# Patient Record
Sex: Female | Born: 1950 | Race: White | Hispanic: No | Marital: Married
Health system: Midwestern US, Community
[De-identification: ages and names within clinical notes are randomized; demographics above are authoritative.]

## PROBLEM LIST (undated history)

## (undated) ENCOUNTER — Emergency Department (HOSPITAL_BASED_OUTPATIENT_CLINIC_OR_DEPARTMENT_OTHER): Payer: Medicare HMO | Source: Home / Self Care

## (undated) DIAGNOSIS — K219 Gastro-esophageal reflux disease without esophagitis: Principal | ICD-10-CM

## (undated) DIAGNOSIS — E559 Vitamin D deficiency, unspecified: Secondary | ICD-10-CM

## (undated) DIAGNOSIS — E785 Hyperlipidemia, unspecified: Principal | ICD-10-CM

## (undated) DIAGNOSIS — E119 Type 2 diabetes mellitus without complications: Principal | ICD-10-CM

## (undated) DIAGNOSIS — M542 Cervicalgia: Secondary | ICD-10-CM

## (undated) DIAGNOSIS — M899 Disorder of bone, unspecified: Principal | ICD-10-CM

## (undated) DIAGNOSIS — K279 Peptic ulcer, site unspecified, unspecified as acute or chronic, without hemorrhage or perforation: Secondary | ICD-10-CM

## (undated) DIAGNOSIS — IMO0001 Reserved for inherently not codable concepts without codable children: Secondary | ICD-10-CM

## (undated) DIAGNOSIS — U071 COVID-19: Secondary | ICD-10-CM

## (undated) DIAGNOSIS — D649 Anemia, unspecified: Secondary | ICD-10-CM

## (undated) DIAGNOSIS — I471 Supraventricular tachycardia, unspecified: Secondary | ICD-10-CM

## (undated) DIAGNOSIS — M5030 Other cervical disc degeneration, unspecified cervical region: Secondary | ICD-10-CM

## (undated) DIAGNOSIS — M1990 Unspecified osteoarthritis, unspecified site: Secondary | ICD-10-CM

## (undated) DIAGNOSIS — K8020 Calculus of gallbladder without cholecystitis without obstruction: Secondary | ICD-10-CM

## (undated) DIAGNOSIS — I7300 Raynaud's syndrome without gangrene: Secondary | ICD-10-CM

## (undated) DIAGNOSIS — R55 Syncope and collapse: Secondary | ICD-10-CM

## (undated) DIAGNOSIS — G709 Myoneural disorder, unspecified: Secondary | ICD-10-CM

## (undated) DIAGNOSIS — Z9889 Other specified postprocedural states: Secondary | ICD-10-CM

## (undated) DIAGNOSIS — M51369 Other intervertebral disc degeneration, lumbar region without mention of lumbar back pain or lower extremity pain: Secondary | ICD-10-CM

## (undated) DIAGNOSIS — K625 Hemorrhage of anus and rectum: Secondary | ICD-10-CM

## (undated) DIAGNOSIS — R112 Nausea with vomiting, unspecified: Secondary | ICD-10-CM

## (undated) DIAGNOSIS — M1120 Other chondrocalcinosis, unspecified site: Secondary | ICD-10-CM

## (undated) DIAGNOSIS — N3010 Interstitial cystitis (chronic) without hematuria: Secondary | ICD-10-CM

## (undated) DIAGNOSIS — Z5189 Encounter for other specified aftercare: Secondary | ICD-10-CM

## (undated) DIAGNOSIS — A0472 Enterocolitis due to Clostridium difficile, not specified as recurrent: Secondary | ICD-10-CM

## (undated) DIAGNOSIS — G4730 Sleep apnea, unspecified: Secondary | ICD-10-CM

## (undated) DIAGNOSIS — M810 Age-related osteoporosis without current pathological fracture: Secondary | ICD-10-CM

## (undated) DIAGNOSIS — R079 Chest pain, unspecified: Secondary | ICD-10-CM

## (undated) DIAGNOSIS — I10 Essential (primary) hypertension: Secondary | ICD-10-CM

## (undated) DIAGNOSIS — G250 Essential tremor: Secondary | ICD-10-CM

## (undated) DIAGNOSIS — G3184 Mild cognitive impairment, so stated: Secondary | ICD-10-CM

## (undated) DIAGNOSIS — M797 Fibromyalgia: Secondary | ICD-10-CM

## (undated) DIAGNOSIS — R0602 Shortness of breath: Secondary | ICD-10-CM

## (undated) DIAGNOSIS — K602 Anal fissure, unspecified: Secondary | ICD-10-CM

## (undated) DIAGNOSIS — E079 Disorder of thyroid, unspecified: Secondary | ICD-10-CM

## (undated) DIAGNOSIS — R413 Other amnesia: Secondary | ICD-10-CM

## (undated) DIAGNOSIS — M5136 Other intervertebral disc degeneration, lumbar region: Secondary | ICD-10-CM

## (undated) DIAGNOSIS — R928 Other abnormal and inconclusive findings on diagnostic imaging of breast: Secondary | ICD-10-CM

## (undated) DIAGNOSIS — C50512 Malignant neoplasm of lower-outer quadrant of left female breast: Secondary | ICD-10-CM

## (undated) DIAGNOSIS — N63 Unspecified lump in unspecified breast: Secondary | ICD-10-CM

## (undated) DIAGNOSIS — R109 Unspecified abdominal pain: Secondary | ICD-10-CM

## (undated) DIAGNOSIS — N644 Mastodynia: Secondary | ICD-10-CM

## (undated) DIAGNOSIS — M719 Bursopathy, unspecified: Secondary | ICD-10-CM

## (undated) DIAGNOSIS — Z1231 Encounter for screening mammogram for malignant neoplasm of breast: Secondary | ICD-10-CM

## (undated) DIAGNOSIS — Z79899 Other long term (current) drug therapy: Secondary | ICD-10-CM

## (undated) DIAGNOSIS — R0609 Other forms of dyspnea: Secondary | ICD-10-CM

## (undated) DIAGNOSIS — R1013 Epigastric pain: Secondary | ICD-10-CM

## (undated) DIAGNOSIS — Z780 Asymptomatic menopausal state: Secondary | ICD-10-CM

## (undated) DIAGNOSIS — M67919 Unspecified disorder of synovium and tendon, unspecified shoulder: Secondary | ICD-10-CM

## (undated) HISTORY — DX: Supraventricular tachycardia: I47.1

## (undated) HISTORY — PX: DILATION AND CURETTAGE OF UTERUS: SHX78

## (undated) HISTORY — DX: Sleep apnea, unspecified: G47.30

## (undated) HISTORY — PX: RECTOCELE REPAIR: SHX761

## (undated) HISTORY — DX: Essential (primary) hypertension: I10

## (undated) HISTORY — DX: Calculus of gallbladder without cholecystitis without obstruction: K80.20

## (undated) HISTORY — DX: Shortness of breath: R06.02

## (undated) HISTORY — PX: CATARACT EXTRACTION: SUR2

## (undated) HISTORY — PX: CARPAL TUNNEL RELEASE: SHX101

## (undated) HISTORY — PX: JOINT REPLACEMENT: SHX530

## (undated) HISTORY — DX: Chest pain, unspecified: R07.9

## (undated) HISTORY — DX: Hemorrhage of anus and rectum: K62.5

## (undated) HISTORY — DX: Other cervical disc degeneration, unspecified cervical region: M50.30

## (undated) HISTORY — PX: OOPHORECTOMY: SHX86

## (undated) HISTORY — DX: Anal fissure, unspecified: K60.2

## (undated) HISTORY — DX: Supraventricular tachycardia, unspecified: I47.10

## (undated) HISTORY — DX: Enterocolitis due to Clostridium difficile, not specified as recurrent: A04.72

## (undated) HISTORY — DX: Peptic ulcer, site unspecified, unspecified as acute or chronic, without hemorrhage or perforation: K27.9

## (undated) HISTORY — DX: Encounter for other specified aftercare: Z51.89

## (undated) HISTORY — DX: Other intervertebral disc degeneration, lumbar region without mention of lumbar back pain or lower extremity pain: M51.369

## (undated) HISTORY — PX: KNEE SURGERY: SHX244

## (undated) HISTORY — DX: Fibromyalgia: M79.7

## (undated) HISTORY — DX: Anemia, unspecified: D64.9

## (undated) HISTORY — PX: CARDIAC CATHETERIZATION: SHX172

## (undated) HISTORY — DX: Mild cognitive impairment, so stated: G31.84

## (undated) HISTORY — DX: Myoneural disorder, unspecified: G70.9

## (undated) HISTORY — PX: BREAST BIOPSY: SHX20

## (undated) HISTORY — DX: Disorder of thyroid, unspecified: E07.9

## (undated) HISTORY — PX: NECK SURGERY: SHX720

## (undated) HISTORY — DX: Unspecified osteoarthritis, unspecified site: M19.90

## (undated) HISTORY — DX: Raynaud's syndrome without gangrene: I73.00

## (undated) HISTORY — PX: CHOLECYSTECTOMY: SHX55

## (undated) HISTORY — DX: Other intervertebral disc degeneration, lumbar region: M51.36

## (undated) HISTORY — PX: TOTAL KNEE ARTHROPLASTY: SHX125

## (undated) HISTORY — PX: TOTAL SHOULDER ARTHROPLASTY: SHX126

## (undated) HISTORY — PX: EYE SURGERY: SHX253

## (undated) HISTORY — DX: Other amnesia: R41.3

## (undated) HISTORY — DX: Other chondrocalcinosis, unspecified site: M11.20

## (undated) HISTORY — PX: ENTEROCELE REPAIR: SHX623

## (undated) HISTORY — DX: Age-related osteoporosis without current pathological fracture: M81.0

## (undated) HISTORY — DX: Essential tremor: G25.0

## (undated) HISTORY — PX: BLADDER SURGERY: SHX569

## (undated) HISTORY — PX: APPENDECTOMY: SHX54

## (undated) HISTORY — DX: Syncope and collapse: R55

## (undated) HISTORY — DX: Interstitial cystitis (chronic) without hematuria: N30.10

## (undated) HISTORY — DX: Reserved for inherently not codable concepts without codable children: IMO0001

## (undated) HISTORY — PX: QUADRICEPS REPAIR: SHX2281

## (undated) HISTORY — DX: Gastro-esophageal reflux disease without esophagitis: K21.9

## (undated) HISTORY — PX: TONSILLECTOMY: SUR1361

## (undated) HISTORY — PX: KNEE ARTHROPLASTY: SHX992

---

## 1998-09-03 ENCOUNTER — Inpatient Hospital Stay (HOSPITAL_COMMUNITY): Admission: EM | Admit: 1998-09-03 | Discharge: 1998-09-04 | Payer: Self-pay | Admitting: Orthopedic Surgery

## 1998-10-04 ENCOUNTER — Encounter: Admission: RE | Admit: 1998-10-04 | Discharge: 1998-11-28 | Payer: Self-pay | Admitting: Orthopedic Surgery

## 1999-01-22 ENCOUNTER — Other Ambulatory Visit: Admission: RE | Admit: 1999-01-22 | Discharge: 1999-01-22 | Payer: Self-pay | Admitting: *Deleted

## 2000-02-03 ENCOUNTER — Encounter: Payer: Self-pay | Admitting: Gastroenterology

## 2000-02-03 ENCOUNTER — Ambulatory Visit (HOSPITAL_COMMUNITY): Admission: RE | Admit: 2000-02-03 | Discharge: 2000-02-03 | Payer: Self-pay | Admitting: Gastroenterology

## 2000-02-12 ENCOUNTER — Encounter: Admission: RE | Admit: 2000-02-12 | Discharge: 2000-02-12 | Payer: Self-pay | Admitting: Gastroenterology

## 2000-02-12 ENCOUNTER — Encounter: Payer: Self-pay | Admitting: Gastroenterology

## 2000-05-27 ENCOUNTER — Other Ambulatory Visit: Admission: RE | Admit: 2000-05-27 | Discharge: 2000-05-27 | Payer: Self-pay | Admitting: *Deleted

## 2001-11-08 ENCOUNTER — Ambulatory Visit (HOSPITAL_COMMUNITY): Admission: RE | Admit: 2001-11-08 | Discharge: 2001-11-08 | Payer: Self-pay | Admitting: Orthopedic Surgery

## 2001-12-14 ENCOUNTER — Other Ambulatory Visit: Admission: RE | Admit: 2001-12-14 | Discharge: 2001-12-14 | Payer: Self-pay | Admitting: *Deleted

## 2002-01-19 ENCOUNTER — Encounter: Admission: RE | Admit: 2002-01-19 | Discharge: 2002-01-19 | Payer: Self-pay | Admitting: Urology

## 2002-01-19 ENCOUNTER — Encounter: Payer: Self-pay | Admitting: Urology

## 2003-06-13 ENCOUNTER — Other Ambulatory Visit: Admission: RE | Admit: 2003-06-13 | Discharge: 2003-06-13 | Payer: Self-pay | Admitting: *Deleted

## 2003-10-15 LAB — HM DEXA SCAN

## 2004-08-11 ENCOUNTER — Other Ambulatory Visit: Admission: RE | Admit: 2004-08-11 | Discharge: 2004-08-11 | Payer: Self-pay | Admitting: *Deleted

## 2005-02-11 ENCOUNTER — Inpatient Hospital Stay (HOSPITAL_COMMUNITY): Admission: RE | Admit: 2005-02-11 | Discharge: 2005-02-13 | Payer: Self-pay | Admitting: *Deleted

## 2005-05-29 ENCOUNTER — Emergency Department (HOSPITAL_COMMUNITY): Admission: EM | Admit: 2005-05-29 | Discharge: 2005-05-29 | Payer: Self-pay | Admitting: Emergency Medicine

## 2005-06-11 ENCOUNTER — Encounter: Admission: RE | Admit: 2005-06-11 | Discharge: 2005-06-11 | Payer: Self-pay | Admitting: Rheumatology

## 2005-09-22 ENCOUNTER — Other Ambulatory Visit: Admission: RE | Admit: 2005-09-22 | Discharge: 2005-09-22 | Payer: Self-pay | Admitting: *Deleted

## 2006-02-01 ENCOUNTER — Encounter: Admission: RE | Admit: 2006-02-01 | Discharge: 2006-02-01 | Payer: Self-pay | Admitting: Orthopedic Surgery

## 2006-05-14 LAB — HM COLONOSCOPY

## 2006-08-26 ENCOUNTER — Encounter: Payer: Self-pay | Admitting: Emergency Medicine

## 2007-02-24 ENCOUNTER — Encounter: Payer: Self-pay | Admitting: Emergency Medicine

## 2007-05-26 ENCOUNTER — Encounter (INDEPENDENT_AMBULATORY_CARE_PROVIDER_SITE_OTHER): Payer: Self-pay | Admitting: Urology

## 2007-05-26 ENCOUNTER — Ambulatory Visit (HOSPITAL_BASED_OUTPATIENT_CLINIC_OR_DEPARTMENT_OTHER): Admission: RE | Admit: 2007-05-26 | Discharge: 2007-05-26 | Payer: Self-pay | Admitting: Urology

## 2007-08-25 ENCOUNTER — Encounter: Payer: Self-pay | Admitting: Emergency Medicine

## 2007-08-29 ENCOUNTER — Ambulatory Visit: Payer: Self-pay | Admitting: Surgery

## 2007-08-29 ENCOUNTER — Other Ambulatory Visit: Admission: RE | Admit: 2007-08-29 | Discharge: 2007-08-29 | Payer: Self-pay | Admitting: *Deleted

## 2007-09-09 ENCOUNTER — Encounter: Payer: Self-pay | Admitting: Emergency Medicine

## 2007-10-31 ENCOUNTER — Encounter: Payer: Self-pay | Admitting: Emergency Medicine

## 2007-12-07 ENCOUNTER — Ambulatory Visit: Payer: Self-pay | Admitting: Physical Medicine & Rehabilitation

## 2007-12-07 ENCOUNTER — Encounter
Admission: RE | Admit: 2007-12-07 | Discharge: 2008-03-06 | Payer: Self-pay | Admitting: Physical Medicine & Rehabilitation

## 2008-01-10 ENCOUNTER — Encounter (INDEPENDENT_AMBULATORY_CARE_PROVIDER_SITE_OTHER): Payer: Self-pay | Admitting: Orthopedic Surgery

## 2008-01-10 ENCOUNTER — Ambulatory Visit: Payer: Self-pay | Admitting: Surgery

## 2008-01-10 ENCOUNTER — Ambulatory Visit (HOSPITAL_COMMUNITY): Admission: RE | Admit: 2008-01-10 | Discharge: 2008-01-10 | Payer: Self-pay | Admitting: Orthopedic Surgery

## 2008-02-07 ENCOUNTER — Ambulatory Visit: Payer: Self-pay | Admitting: Physical Medicine & Rehabilitation

## 2008-04-10 ENCOUNTER — Emergency Department (HOSPITAL_BASED_OUTPATIENT_CLINIC_OR_DEPARTMENT_OTHER): Admission: EM | Admit: 2008-04-10 | Discharge: 2008-04-11 | Payer: Self-pay | Admitting: Emergency Medicine

## 2008-04-13 ENCOUNTER — Encounter: Admission: RE | Admit: 2008-04-13 | Discharge: 2008-07-12 | Payer: Self-pay | Admitting: Orthopedic Surgery

## 2008-04-17 ENCOUNTER — Encounter: Admission: RE | Admit: 2008-04-17 | Discharge: 2008-04-17 | Payer: Self-pay | Admitting: Rheumatology

## 2008-04-25 ENCOUNTER — Emergency Department (HOSPITAL_BASED_OUTPATIENT_CLINIC_OR_DEPARTMENT_OTHER): Admission: EM | Admit: 2008-04-25 | Discharge: 2008-04-26 | Payer: Self-pay | Admitting: Emergency Medicine

## 2008-04-30 ENCOUNTER — Encounter: Payer: Self-pay | Admitting: Emergency Medicine

## 2008-06-08 DIAGNOSIS — I471 Supraventricular tachycardia: Secondary | ICD-10-CM | POA: Insufficient documentation

## 2008-06-08 DIAGNOSIS — N3010 Interstitial cystitis (chronic) without hematuria: Secondary | ICD-10-CM | POA: Insufficient documentation

## 2008-06-08 DIAGNOSIS — I5032 Chronic diastolic (congestive) heart failure: Secondary | ICD-10-CM | POA: Insufficient documentation

## 2008-06-11 ENCOUNTER — Ambulatory Visit: Payer: Self-pay | Admitting: Emergency Medicine

## 2008-06-11 DIAGNOSIS — J309 Allergic rhinitis, unspecified: Secondary | ICD-10-CM | POA: Insufficient documentation

## 2008-06-11 DIAGNOSIS — R0600 Dyspnea, unspecified: Secondary | ICD-10-CM | POA: Insufficient documentation

## 2008-06-11 DIAGNOSIS — M1990 Unspecified osteoarthritis, unspecified site: Secondary | ICD-10-CM | POA: Insufficient documentation

## 2008-07-24 ENCOUNTER — Ambulatory Visit: Payer: Self-pay | Admitting: Emergency Medicine

## 2008-07-24 ENCOUNTER — Encounter (INDEPENDENT_AMBULATORY_CARE_PROVIDER_SITE_OTHER): Payer: Self-pay | Admitting: *Deleted

## 2008-08-10 ENCOUNTER — Encounter: Payer: Self-pay | Admitting: Emergency Medicine

## 2008-08-10 ENCOUNTER — Ambulatory Visit (HOSPITAL_COMMUNITY): Admission: RE | Admit: 2008-08-10 | Discharge: 2008-08-10 | Payer: Self-pay | Admitting: Emergency Medicine

## 2008-08-23 ENCOUNTER — Ambulatory Visit: Payer: Self-pay | Admitting: Internal Medicine

## 2008-08-24 ENCOUNTER — Ambulatory Visit: Payer: Self-pay | Admitting: Emergency Medicine

## 2008-08-26 ENCOUNTER — Ambulatory Visit: Payer: Self-pay | Admitting: Internal Medicine

## 2008-08-30 ENCOUNTER — Encounter: Payer: Self-pay | Admitting: Emergency Medicine

## 2008-09-07 ENCOUNTER — Telehealth: Payer: Self-pay | Admitting: Emergency Medicine

## 2008-09-25 ENCOUNTER — Ambulatory Visit (HOSPITAL_COMMUNITY): Admission: RE | Admit: 2008-09-25 | Discharge: 2008-09-25 | Payer: Self-pay | Admitting: Emergency Medicine

## 2008-09-25 ENCOUNTER — Encounter: Payer: Self-pay | Admitting: Emergency Medicine

## 2008-10-24 ENCOUNTER — Telehealth: Payer: Self-pay | Admitting: Emergency Medicine

## 2008-10-31 ENCOUNTER — Telehealth: Payer: Self-pay | Admitting: Emergency Medicine

## 2008-11-13 ENCOUNTER — Telehealth: Payer: Self-pay | Admitting: Emergency Medicine

## 2009-01-29 ENCOUNTER — Encounter: Admission: RE | Admit: 2009-01-29 | Discharge: 2009-01-29 | Payer: Self-pay | Admitting: Gastroenterology

## 2009-02-27 ENCOUNTER — Ambulatory Visit (HOSPITAL_BASED_OUTPATIENT_CLINIC_OR_DEPARTMENT_OTHER): Admission: RE | Admit: 2009-02-27 | Discharge: 2009-02-27 | Payer: Self-pay | Admitting: Rheumatology

## 2009-03-04 LAB — GLUCOSE TOLERANCE, 2 HR
GLUCOSE, 1 HR: 173 MG/DL — ABNORMAL HIGH (ref 85–160)
GLUCOSE, 2 HR: 89 MG/DL (ref 70–125)
GLUCOSE, FASTING: 108 MG/DL — ABNORMAL HIGH (ref 74–106)

## 2009-03-19 ENCOUNTER — Encounter: Admission: RE | Admit: 2009-03-19 | Discharge: 2009-03-19 | Payer: Self-pay | Admitting: Family Medicine

## 2009-03-29 ENCOUNTER — Ambulatory Visit (HOSPITAL_BASED_OUTPATIENT_CLINIC_OR_DEPARTMENT_OTHER): Admission: RE | Admit: 2009-03-29 | Discharge: 2009-03-29 | Payer: Self-pay | Admitting: Rheumatology

## 2009-04-24 ENCOUNTER — Ambulatory Visit: Payer: Self-pay | Admitting: Emergency Medicine

## 2009-04-24 DIAGNOSIS — G4733 Obstructive sleep apnea (adult) (pediatric): Secondary | ICD-10-CM | POA: Insufficient documentation

## 2009-05-09 ENCOUNTER — Telehealth: Payer: Self-pay | Admitting: Emergency Medicine

## 2009-05-14 LAB — ANNUAL GYN EXAM; BREAST EXAM W/O PELVIC EVAL

## 2009-05-14 LAB — HM PAP SMEAR

## 2009-05-22 ENCOUNTER — Encounter: Payer: Self-pay | Admitting: Emergency Medicine

## 2009-06-17 ENCOUNTER — Encounter: Admission: RE | Admit: 2009-06-17 | Discharge: 2009-06-17 | Payer: Self-pay | Admitting: Neurosurgery

## 2009-06-21 LAB — VITAMIN D, 25 HYDROXY: Vitamin D 25-Hydroxy: 23 ng/mL — ABNORMAL LOW (ref 30–80)

## 2009-06-21 LAB — ALDOLASE: Aldolase: 5.4 U/L (ref 1.5–8.1)

## 2009-06-22 LAB — CK: CK: 57 U/L (ref 21–215)

## 2009-07-30 ENCOUNTER — Ambulatory Visit: Payer: Self-pay | Admitting: Emergency Medicine

## 2009-09-15 ENCOUNTER — Emergency Department (HOSPITAL_BASED_OUTPATIENT_CLINIC_OR_DEPARTMENT_OTHER): Admission: EM | Admit: 2009-09-15 | Discharge: 2009-09-16 | Payer: Self-pay | Admitting: Emergency Medicine

## 2009-09-15 ENCOUNTER — Ambulatory Visit: Payer: Self-pay | Admitting: Diagnostic Radiology

## 2009-09-16 ENCOUNTER — Ambulatory Visit: Payer: Self-pay | Admitting: Diagnostic Radiology

## 2009-12-27 ENCOUNTER — Telehealth (INDEPENDENT_AMBULATORY_CARE_PROVIDER_SITE_OTHER): Payer: Self-pay | Admitting: *Deleted

## 2010-01-10 ENCOUNTER — Ambulatory Visit: Payer: Self-pay | Admitting: Emergency Medicine

## 2010-06-17 LAB — MICROSCOPIC EXAMINATION

## 2010-06-17 LAB — URINALYSIS W/ RFLX MICROSCOPIC
Bilirubin: NEGATIVE
Glucose: NEGATIVE
Ketone: NEGATIVE
Leukocyte Esterase: NEGATIVE
Nitrites: NEGATIVE
Protein: NEGATIVE
Specific Gravity: 1.026 (ref 1.005–1.030)
Urobilinogen: 0.2 mg/dL (ref 0.0–1.9)
pH (UA): 6 (ref 5.0–7.5)

## 2010-06-17 LAB — CBC WITH AUTOMATED DIFF
ABS. BASOPHILS: 0 10*3/uL (ref 0.0–0.2)
ABS. EOSINOPHILS: 0.1 10*3/uL (ref 0.0–0.4)
ABS. IMM. GRANS.: 0 10*3/uL (ref 0.0–0.1)
ABS. LYMPHOCYTES: 1.2 10*3/uL (ref 0.7–4.5)
ABS. MONOCYTES: 0.4 10*3/uL (ref 0.1–1.0)
ABS. NEUTROPHILS: 4 10*3/uL (ref 1.8–7.8)
BASOPHILS: 0 % (ref 0–3)
EOSINOPHILS: 2 % (ref 0–7)
HCT: 38.4 % (ref 34.0–44.0)
HGB: 12.3 g/dL (ref 11.5–15.0)
IMMATURE GRANULOCYTES: 0 % (ref 0–2)
LYMPHOCYTES: 21 % (ref 14–46)
MCH: 29.9 pg (ref 27.0–34.0)
MCHC: 32 g/dL (ref 32.0–36.0)
MCV: 93 fL (ref 80–98)
MONOCYTES: 7 % (ref 4–13)
NEUTROPHILS: 70 % (ref 40–74)
PLATELET: 235 10*3/uL (ref 140–415)
RBC: 4.11 x10E6/uL (ref 3.80–5.10)
RDW: 13.2 % (ref 11.7–15.0)
WBC: 5.7 10*3/uL (ref 4.0–10.5)

## 2010-06-17 LAB — LIPID PANEL
Cholesterol, total: 160 mg/dL (ref 100–199)
HDL Cholesterol: 39 mg/dL — ABNORMAL LOW (ref >39)
LDL, calculated: 87 mg/dL (ref 0–99)
Triglyceride: 172 mg/dL — ABNORMAL HIGH (ref 0–149)
VLDL, calculated: 34 mg/dL (ref 5–40)

## 2010-06-17 LAB — HEMOGLOBIN A1C WITH EAG: Hemoglobin A1c: 6.3 % — ABNORMAL HIGH (ref 4.8–5.6)

## 2010-06-17 LAB — TSH 3RD GENERATION: TSH: 2.28 u[IU]/mL (ref 0.450–4.500)

## 2010-06-17 LAB — GLUCOSE, RANDOM: Glucose: 108 mg/dL — ABNORMAL HIGH (ref 65–99)

## 2010-06-27 ENCOUNTER — Ambulatory Visit: Payer: Self-pay | Admitting: Cardiovascular Disease

## 2010-08-22 ENCOUNTER — Ambulatory Visit: Payer: Self-pay | Admitting: Cardiovascular Disease

## 2010-08-25 ENCOUNTER — Encounter: Admission: RE | Admit: 2010-08-25 | Discharge: 2010-08-25 | Payer: Self-pay | Admitting: Cardiovascular Disease

## 2010-08-25 ENCOUNTER — Telehealth (INDEPENDENT_AMBULATORY_CARE_PROVIDER_SITE_OTHER): Payer: Self-pay | Admitting: *Deleted

## 2010-08-25 ENCOUNTER — Ambulatory Visit: Payer: Self-pay | Admitting: Emergency Medicine

## 2010-08-25 DIAGNOSIS — R930 Abnormal findings on diagnostic imaging of skull and head, not elsewhere classified: Secondary | ICD-10-CM | POA: Insufficient documentation

## 2010-08-25 DIAGNOSIS — R609 Edema, unspecified: Secondary | ICD-10-CM | POA: Insufficient documentation

## 2010-08-27 ENCOUNTER — Ambulatory Visit: Payer: Self-pay | Admitting: Internal Medicine

## 2010-08-27 ENCOUNTER — Encounter: Payer: Self-pay | Admitting: Emergency Medicine

## 2010-08-27 ENCOUNTER — Ambulatory Visit (HOSPITAL_COMMUNITY): Admission: RE | Admit: 2010-08-27 | Discharge: 2010-08-27 | Payer: Self-pay | Admitting: Cardiology

## 2010-08-27 ENCOUNTER — Ambulatory Visit: Payer: Self-pay

## 2010-08-28 ENCOUNTER — Encounter: Payer: Self-pay | Admitting: Emergency Medicine

## 2010-08-29 LAB — CONVERTED CEMR LAB

## 2010-08-30 ENCOUNTER — Emergency Department (HOSPITAL_BASED_OUTPATIENT_CLINIC_OR_DEPARTMENT_OTHER): Admission: EM | Admit: 2010-08-30 | Discharge: 2010-08-31 | Payer: Self-pay | Admitting: Emergency Medicine

## 2010-08-30 ENCOUNTER — Ambulatory Visit: Payer: Self-pay | Admitting: Diagnostic Radiology

## 2010-09-01 ENCOUNTER — Telehealth (INDEPENDENT_AMBULATORY_CARE_PROVIDER_SITE_OTHER): Payer: Self-pay | Admitting: *Deleted

## 2010-09-04 ENCOUNTER — Telehealth (INDEPENDENT_AMBULATORY_CARE_PROVIDER_SITE_OTHER): Payer: Self-pay | Admitting: *Deleted

## 2010-09-04 ENCOUNTER — Ambulatory Visit: Payer: Self-pay | Admitting: Emergency Medicine

## 2010-09-30 ENCOUNTER — Ambulatory Visit: Payer: Self-pay | Admitting: Vascular Surgery

## 2010-10-30 MED ORDER — CITALOPRAM 10 MG TAB
10 mg | ORAL_TABLET | Freq: Every day | ORAL | Status: DC
Start: 2010-10-30 — End: 2012-03-22

## 2010-10-30 MED ORDER — ATORVASTATIN 10 MG TAB
10 mg | ORAL_TABLET | Freq: Every day | ORAL | Status: DC
Start: 2010-10-30 — End: 2011-03-13

## 2010-10-30 NOTE — Telephone Encounter (Signed)
Pt notified RX ready for pick up

## 2010-10-30 NOTE — Telephone Encounter (Signed)
Last seen 06-16-10

## 2010-11-17 ENCOUNTER — Encounter: Payer: Self-pay | Admitting: Neurosurgery

## 2010-11-23 LAB — CONVERTED CEMR LAB
BUN: 9 mg/dL (ref 6–23)
CO2: 29 meq/L (ref 19–32)
Calcium: 8.8 mg/dL (ref 8.4–10.5)
Chloride: 97 meq/L (ref 96–112)
Creatinine, Ser: 0.9 mg/dL (ref 0.4–1.2)
GFR calc non Af Amer: 68.12 mL/min (ref >60)
Glucose, Bld: 95 mg/dL (ref 70–99)
Potassium: 3.5 meq/L (ref 3.5–5.1)
Sodium: 139 meq/L (ref 135–145)

## 2010-11-25 NOTE — Consult Note (Signed)
Summary: palpitations/[facility]  palpitations/[facility]   Imported By: Lester [place] 06/19/2008 10:44:20  _____________________________________________________________________  External Attachment:    Type:   Image     Comment:   External Document

## 2010-11-25 NOTE — Consult Note (Signed)
Summary: extremity edema/[facility]  extremity edema/[facility]   Imported By: Lester [place] 06/19/2008 10:45:47  _____________________________________________________________________  External Attachment:    Type:   Image     Comment:   External Document

## 2010-11-25 NOTE — Miscellaneous (Signed)
Summary: Orders Update  Clinical Lists Changes  Orders: Added new Service order of Est. Patient Level III (99213) - Signed 

## 2010-11-25 NOTE — Progress Notes (Signed)
Summary: cpap mask /settings   [email or web address]  Phone Note Call from Patient Call back at [facility] Phone [phone]   Caller: Patient Call For: Aprel Egelhoff Summary of Call: pt says that she can not tolerate the new cpap mask. says that the person from [facility] told her to call and get an order for auto machine to check settings. call home or cell [phone] Initial call taken by: Tivis Ringer,  May 09, 2009 2:06 PM  Follow-up for Phone Call        Dr. Mort Sawyers this ok to give an order for auto machine to check the settings?  please advise triage Marijo File CMA  May 09, 2009 2:29 PM   Yes, they can do auto-set. They might also need to try some other masks for better fit. thanks  [email or web address].  Arman Filter LPN  May 10, 2009 5:26 PM  Follow-up by: Leslye Peer MD,  May 10, 2009 5:13 PM  Additional Follow-up for Phone Call Additional follow up Details #1::        called and spoke with pt. pt aware of RB's response.  Will send order to [facility] for auto download.  Aundra Millet Reynolds LPN  May 13, 2009 11:33 AM

## 2010-11-25 NOTE — Assessment & Plan Note (Signed)
Summary: OSA, edema, recent abnormal CT scan   Visit Type:  Follow-up Copy to:  Delane Ginger Primary Provider/Referring Provider:  Veronia Beets  CC:  The patient c/o increased SOB with exertion and lower leg edema x2 1/2 weeks...patient needs new cpap mask.  History of Present Illness: 60 yo woman never smoker with hx paroxismal SVT, Holter showed correlation at the time with exertional dyspnea and lightheadedness. PFT, methacholine and CPEX were reassuring. Has undergone CPAP titration and needs 14cm H2O pressure  ROV 07/30/09 -- Not using nasal pillows, using the nasal mask. Has been switched to auto-titration device due to difficulty tolerating 14cm H2O. Has had better compliance with latest mask. Wears it every night, When she wakes up in the middle of the night she takes it off. Can't really tell any difference in how she feels during the day - does have less fibromyalgia pain since she's been using it.   January 10, 2010--Presents for acute office visit. Complains of occ pain in left upper back wrapping around to the left breast when she takes a deep breath x8weeks.   Happens intermittently, sore to touch, pain is under shoulder blade w/ radiation around to rbs. Use muscle relaxer patch did not help, no other meds used. Denies chest pain, dyspnea, orthopnea, hemoptysis, fever, n/v/d, edema, headache,recent travel or antibiotics.    ROV 08/25/10 -- Hx OSA and HTN. Was started on diltiazem about 6 weeks ago. Began to develop B LE edema, L>R, about 2 weeks ago. US showed no DVT, CT scan chest done, no PE showed "? PNA" so treated with levaquin. She had absolutely no symptoms at that time. She has been having some exertional dyspnea, occasionally feels her tachycardia, not sure the two correlate. Tells me she hasn't worn her CPAP in about 4 months because her mask is hurting her face.   Current Medications (verified): 1)  Estrace 2 Mg  Tabs (Estradiol) .Marland Kitchen.. 1 By Mouth Daily 2)  Digoxin 0.25 Mg   Tabs (Digoxin) .Marland Kitchen.. 1 By Mouth Daily 3)  Lyrica 150 Mg  Caps (Pregabalin) .... Take 1 Tablet By Mouth Two Times A Day 4)  Folgard .Marland KitchenMarland Kitchen. 1 Daily 5)  Zegerid 40-1100 Mg  Caps (Omeprazole-Sodium Bicarbonate) .Marland Kitchen.. 1 By Mouth Daily 6)  Magnesium 250 Mg  Tabs (Magnesium) .Marland Kitchen.. 1 By Mouth Two Times A Day 7)  Eql Calcium 600 Mg  Tabs (Calcium Carbonate) .Marland Kitchen.. 1 By Mouth Two Times A Day 8)  Potassium Gluconate 595 Mg  Tabs (Potassium Gluconate) .Marland Kitchen.. 1 By Mouth Daily 9)  Allegra 180 Mg  Tabs (Fexofenadine Hcl) .... Take 1 Tablet By Mouth Once A Day 10)  Nitrostat 0.4 Mg Subl (Nitroglycerin) .Marland Kitchen.. 1 Under Tongue Every 5 Minutes X3 As Needed Chest Pain 11)  Furosemide 20 Mg  Tabs (Furosemide) .Marland Kitchen.. 1 By Mouth Daily 12)  Elmiron 100 Mg  Caps (Pentosan Polysulfate Sodium) .... Three Times A Day 13)  Trimethoprim 100 Mg  Tabs (Trimethoprim) .... Take 1 Tablet By Mouth Once A Day 14)  Pindolol 5 Mg Tabs (Pindolol) .... Take 1 Tablet By Mouth Once A Day 15)  Flonase 50 Mcg/act Susp (Fluticasone Propionate) .... 2 Sprays Once A Day As Needed 16)  Ultra C Tablets .Marland Kitchen.. 6 Tablets As Needed 17)  Flexeril 10 Mg Tabs (Cyclobenzaprine Hcl) .... Take 1 Tab By Mouth At Bedtime 18)  Flexcort Patches .... As Needed 19)  Lidoderm 5 % Ptch (Lidocaine) .... Apply As Needed 20)  Ibuprofen 800 Mg Tabs (Ibuprofen) .Marland KitchenMarland KitchenMarland Kitchen  Use As Directed 21)  Cpap .... Wear At Bedtime 22)  Diltiazem Hcl Cr 180 Mg Xr24h-Cap (Diltiazem Hcl) .Marland Kitchen.. 1 By Mouth Daily  Allergies (verified): 1)  ! Verapamil 2)  ! * Diltiazem 3)  ! * Toprol 4)  ! Codeine 5)  ! Macrodantin  Vital Signs:  Patient profile:   60 year old female Height:      66 inches (167.64 cm) Weight:      173.25 pounds (78.75 kg) BMI:     28.06 O2 Sat:      97 % on Room air Temp:     97.8 degrees F (36.56 degrees C) oral Pulse rate:   77 / minute BP sitting:   120 / 80  (left arm) Cuff size:   regular  Vitals Entered By: Michel Bickers CMA (August 25, 2010 4:36 PM)  O2 Sat at  Rest %:  97 O2 Flow:  Room air CC: The patient c/o increased SOB with exertion and lower leg edema x2 1/2 weeks...patient needs new cpap mask Comments Medications reviewed with patient Michel Bickers [facility]  August 25, 2010 4:50 PM   Physical Exam  General:  normal appearance and healthy appearing.   Head:  normocephalic and atraumatic Nose:  no deformity, discharge, inflammation, or lesions Mouth:  no deformity or lesions Neck:  no masses, thyromegaly, or abnormal cervical nodes Lungs:  clear bilaterally to auscultation and percussion Heart:  regular rate and rhythm, S1, S2, ? soft S4 Abdomen:  not examined Msk:  no deformity or scoliosis noted with normal posture Extremities:  2+ L LE and 1+ RLE edema.  Neurologic:  non-focal Skin:  intact without lesions or rashes Psych:  alert and cooperative; normal mood and affect; normal attention span and concentration  EXECUTABLE Impression & Recommendations:  Problem # 1:  DYSPNEA ON EXERTION (ICD-786.09) Her pulm eval has been reassuring. She isn't wearing CPAP, ? worsening Pulm presures leading to edema - seems very acute for this to be the explanation. ? whether she is having worsening episodic SVT to explain both her dyspnea and her edema. Also consider edema is due to side effect from the diltiazem, her only new med.   Problem # 2:  SLEEP APNEA (ICD-780.57)  Currently untreated! This could be contributor to her edema as well.   Orders: DME Referral (DME)  Problem # 3:  EDEMA (ICD-782.3)  - need TTE by Dr Elease Hashimoto - consider repeat Holter moniter - BMP today  - 24 hour urine to look for proteinuria, ? nephrotic syndrome  Orders: Est. Patient Level IV (99214) T-Urine 24 Hr. Protein [phone]) TLB-BMP (Basic Metabolic Panel-BMET) (80048-METABOL) DME Referral (DME) Echo Referral (Echo)  Problem # 4:  COMPUTERIZED TOMOGRAPHY, CHEST, ABNORMAL (ICD-793.1) - need to get a copy of the CT scan done [facility] so we can  eval and plan next step.   Medications Added to Medication List This Visit: 1)  Diltiazem Hcl Cr 180 Mg Xr24h-cap (Diltiazem hcl) .Marland Kitchen.. 1 by mouth daily  Patient Instructions: 1)  We will perform a 24h urinalysis and blood work to check your kidney function.  2)  We will obtain a copy of your CT scan of the chest from [facility] 3)  We will ask Dr Elease Hashimoto to perform an echocardiogram, possibly a repeat Holter monitor. 4)  We will work on getting your CPAP mask adjusted so you can wear more reliably 5)  Follow up with Dr Delton Coombes in 1 week to review these results  Immunization History:  Influenza Immunization History:    Influenza:  historical (08/19/2010)

## 2010-11-25 NOTE — Consult Note (Signed)
Summary: pt "winded"/[facility]  pt "winded"/[facility]   Imported By: Lester [place] 06/19/2008 10:48:25  _____________________________________________________________________  External Attachment:    Type:   Image     Comment:   External Document

## 2010-11-25 NOTE — Progress Notes (Signed)
Summary: Update on swelling  Phone Note Call from Patient Call back at [phone]   Caller: Patient Call For: byrum Summary of Call: pt went to er for feet swelling and sob. would like to give update. Initial call taken by: Rickard Patience,  September 01, 2010 9:57 AM  Follow-up for Phone Call        The patient was calling to let RB know she had been seen in the ED off [address] on Sat., 08/30/2010. They stopped her potassium and started her on Spironalactone 50mg  daily. She is also still taking Lasix 20mg  daily. They told her in the Ed that she should be tested for [facility] with heart cath. Her swelling has gotten better but still present. She is scheduled for ROV with RB on Thurs., 09/04/2010 and will keep this appt. Follow-up by: Michel Bickers CMA,  September 01, 2010 11:28 AM  Additional Follow-up for Phone Call Additional follow up Details #1::        Her that her echo shows no evidence to support Pulm HTN. Her 24 h urine shows no evidence for nephrotic syndrome. Will plan to f/u w her on 11/10 to review further. Leslye Peer MD  September 02, 2010 4:21 PM  Additional Follow-up by: Leslye Peer MD,  September 02, 2010 4:21 PM     Appended Document: Update on swelling Patient is aware of RB comments on Echo and 24 hour urine. She will follow-up as planned with RB on 09/04/2010 @ 1:30pm.

## 2010-11-25 NOTE — Progress Notes (Signed)
Summary: pain/ breathing  Phone Note Call from Patient Call back at [facility] Phone [phone]   Caller: Patient Call For: byrum Summary of Call: pt c/o pain under L shoulder blade when taking a deep breath x 1 month. i have scheduled pt for 1st avail w/ tp (1st avail w/ rb is in april). I advised pt to call if she feels she needs to be seen sooner by another dr, etc. she said she would call. i just thought i would send this msg in case nurse wants to call pt to assess further if needed. i didn't know how long she should wait to be seen since she has had this for a month now. thanks.  Initial call taken by: Tivis Ringer, CNA,  December 27, 2009 3:15 PM  Follow-up for Phone Call        ADVISED PT TO CALL HER PRIMARY MD TO SEE IF THEY COULD SEE HER FOR THIS SINCE RB TREATS HER FOR SLEEP APNEA--PT OK WITH CALLING PRIMARY FOR THIS, ALSO SCHEDULED 6 MONTH F/U FOR PT TO SEE RB Follow-up by: Philipp Deputy CMA,  December 27, 2009 5:19 PM

## 2010-11-25 NOTE — Assessment & Plan Note (Signed)
Summary: edema, OSA   Visit Type:  Follow-up Copy to:  Delane Ginger Primary Provider/Referring Provider:  Veronia Beets  CC:  patient says edema has improved but still present...skin burns to the touch on lower legs.  History of Present Illness: 60 yo woman never smoker with hx paroxismal SVT, Holter showed correlation at the time with exertional dyspnea and lightheadedness. PFT, methacholine and CPEX were reassuring. Has undergone CPAP titration and needs 14cm H2O pressure  ROV 07/30/09 -- Not using nasal pillows, using the nasal mask. Has been switched to auto-titration device due to difficulty tolerating 14cm H2O. Has had better compliance with latest mask. Wears it every night, When she wakes up in the middle of the night she takes it off. Can't really tell any difference in how she feels during the day - does have less fibromyalgia pain since she's been using it.   January 10, 2010--Presents for acute office visit. Complains of occ pain in left upper back wrapping around to the left breast when she takes a deep breath x8weeks.   Happens intermittently, sore to touch, pain is under shoulder blade w/ radiation around to rbs. Use muscle relaxer patch did not help, no other meds used. Denies chest pain, dyspnea, orthopnea, hemoptysis, fever, n/v/d, edema, headache,recent travel or antibiotics.    ROV 08/25/10 -- Hx OSA and HTN. Was started on diltiazem about 6 weeks ago. Began to develop B LE edema, L>R, about 2 weeks ago. US showed no DVT, CT scan chest done, no PE showed "? PNA" so treated with levaquin. She had absolutely no symptoms at that time. She has been having some exertional dyspnea, occasionally feels her tachycardia, not sure the two correlate. Tells me she hasn't worn her CPAP in about 4 months because her mask is hurting her face.   ROV 09/04/10 -- Returns to f/u her edema, exertional SOB. I performed 24h urine, TTE last time - both are reassuring. She has not been wearing her CPAP  mask reliably, needs a new mask.   Current Medications (verified): 1)  Estrace 2 Mg  Tabs (Estradiol) .Marland Kitchen.. 1 By Mouth Daily 2)  Digoxin 0.25 Mg  Tabs (Digoxin) .Marland Kitchen.. 1 By Mouth Daily 3)  Lyrica 150 Mg  Caps (Pregabalin) .... Take 1 Tablet By Mouth Two Times A Day 4)  Folgard .Marland KitchenMarland Kitchen. 1 Daily 5)  Zegerid 40-1100 Mg  Caps (Omeprazole-Sodium Bicarbonate) .Marland Kitchen.. 1 By Mouth Daily 6)  Potassium Gluconate 595 Mg  Tabs (Potassium Gluconate) .Marland Kitchen.. 1 By Mouth Daily 7)  Allegra 180 Mg  Tabs (Fexofenadine Hcl) .... Take 1 Tablet By Mouth Once A Day 8)  Nitrostat 0.4 Mg Subl (Nitroglycerin) .Marland Kitchen.. 1 Under Tongue Every 5 Minutes X3 As Needed Chest Pain 9)  Furosemide 20 Mg  Tabs (Furosemide) .... 40mg  Daily 10)  Elmiron 100 Mg  Caps (Pentosan Polysulfate Sodium) .... Three Times A Day 11)  Trimethoprim 100 Mg  Tabs (Trimethoprim) .... Take 1 Tablet By Mouth Once A Day 12)  Pindolol 5 Mg Tabs (Pindolol) .... Take 1 Tablet By Mouth Once A Day 13)  Flonase 50 Mcg/act Susp (Fluticasone Propionate) .... 2 Sprays Once A Day As Needed 14)  Ultra C Tablets .Marland Kitchen.. 6 Tablets As Needed 15)  Flexeril 10 Mg Tabs (Cyclobenzaprine Hcl) .... Take 1 Tab By Mouth At Bedtime 16)  Flexcort Patches .... As Needed 17)  Lidoderm 5 % Ptch (Lidocaine) .... Apply As Needed 18)  Ibuprofen 800 Mg Tabs (Ibuprofen) .... Use As Directed 19)  Cpap .Marland KitchenMarland KitchenMarland Kitchen  Wear At Bedtime 20)  Diltiazem Hcl Cr 180 Mg Xr24h-Cap (Diltiazem Hcl) .Marland Kitchen.. 1 By Mouth Daily 21)  Spironolactone 50 Mg Tabs (Spironolactone) .Marland Kitchen.. 1 By Mouth Daily  Allergies (verified): 1)  ! Verapamil 2)  ! * Diltiazem 3)  ! * Toprol 4)  ! Codeine 5)  ! Macrodantin  Vital Signs:  Patient profile:   60 year old female Height:      66 inches (167.64 cm) Weight:      167 pounds (75.91 kg) BMI:     27.05 O2 Sat:      98 % on Room air Temp:     97.8 degrees F (36.56 degrees C) oral Pulse rate:   66 / minute BP sitting:   130 / 78  (left arm) Cuff size:   regular  Vitals Entered By:  Michel Bickers CMA (September 04, 2010 1:41 PM)  O2 Sat at Rest %:  98 O2 Flow:  Room air CC: patient says edema has improved but still present...skin burns to the touch on lower legs Comments Medications reviewed with patient Michel Bickers CMA  September 04, 2010 1:42 PM   Physical Exam  General:  normal appearance and healthy appearing.   Head:  normocephalic and atraumatic Nose:  no deformity, discharge, inflammation, or lesions Mouth:  no deformity or lesions Neck:  no masses, thyromegaly, or abnormal cervical nodes Lungs:  clear bilaterally to auscultation and percussion Heart:  regular rate and rhythm, S1, S2, ? soft S4 Abdomen:  not examined Msk:  no deformity or scoliosis noted with normal posture Extremities:  1+ L LE and 1+ RLE edema.  Neurologic:  non-focal Skin:  intact without lesions or rashes Psych:  alert and cooperative; normal mood and affect; normal attention span and concentration   Echocardiogram  Procedure date:  08/27/2010  Findings:      Study Conclusions            - Left ventricle: The cavity size was normal. Wall thickness was       normal. Systolic function was normal. The estimated ejection       fraction was in the range of 55% to 60%. Wall motion was normal;       there were no regional wall motion abnormalities. Left ventricular       diastolic function parameters were normal.     - Left atrium: The atrium was mildly dilated.  CT of Chest  Procedure date:  08/12/2010  Findings:      Reviewed today - No PE. Some dependent ATX bilat, best seen LLL.   Impression & Recommendations:  Problem # 1:  DYSPNEA ON EXERTION (ICD-786.09) Believe this reflects deconditioning, workup has been reassuring  Her updated medication list for this problem includes:    Furosemide 20 Mg Tabs (Furosemide) ..... 40mg  daily    Pindolol 5 Mg Tabs (Pindolol) .Marland Kitchen... Take 1 tablet by mouth once a day    Spironolactone 50 Mg Tabs (Spironolactone) .Marland Kitchen... 1 by mouth  daily  Problem # 2:  EDEMA (ICD-782.3) Not explained. No evidence DVT, good LV fxn, no evidence ne[phrotic syndrome. ? lymphedema  Problem # 3:  SLEEP APNEA (ICD-780.57)  Need to get a better fit mask, work on compliance  Orders: DME Referral (DME) Est. Patient Level IV (95621)  Medications Added to Medication List This Visit: 1)  Furosemide 20 Mg Tabs (Furosemide) .... 40mg  daily 2)  Spironolactone 50 Mg Tabs (Spironolactone) .Marland Kitchen.. 1 by mouth daily  Patient Instructions: 1)  We will ask [facility] to get you a better fitting CPAP mask.  2)  Work on wearing your mask every night 3)  Follow up with Dr Delton Coombes in 6 months or as needed

## 2010-11-25 NOTE — Assessment & Plan Note (Signed)
Summary: PAIN W/ DEEP BREATH x 1 month///kp   Copy to:  Lady Deutscher  CC:  occ pain in left upper back wrapping around to the left breast when she takes a deep breath x8weeks.Marland Kitchen  History of Present Illness: 60 yo woman never smoker with hx paroxismal SVT, Holter showed correlation at the time with exertional dyspnea and lightheadedness. PFT, methacholine and CPEX were reassuring. Has undergone CPAP titration and needs 14cm H2O pressure  ROV 07/30/09 -- Not using nasal pillows, using the nasal mask. Has been switched to auto-titration device due to difficulty tolerating 14cm H2O. Has had better compliance with latest mask. Wears it every night, When she wakes up in the middle of the night she takes it off. Can't really tell any difference in how she feels during the day - does have less fibromyalgia pain since she's been using it.   January 10, 2010--Presents for acute office visit. Complains of occ pain in left upper back wrapping around to the left breast when she takes a deep breath x8weeks.   Happens intermittently, sore to touch, pain is under shoulder blade w/ radiation around to rbs. Use muscle relaxer patch did not help, no other meds used. Denies chest pain, dyspnea, orthopnea, hemoptysis, fever, n/v/d, edema, headache,recent travel or antibiotics.    Medications Prior to Update: 1)  Estrace 2 Mg  Tabs (Estradiol) .Marland Kitchen.. 1 By Mouth Daily 2)  Colchicine 0.6 Mg  Tabs (Colchicine) .Marland Kitchen.. 1 By Mouth Daily 3)  Digoxin 0.25 Mg  Tabs (Digoxin) .Marland Kitchen.. 1 By Mouth Daily 4)  Lyrica 150 Mg  Caps (Pregabalin) .... Take 1 Tablet By Mouth Two Times A Day 5)  Folgard .Marland KitchenMarland Kitchen. 1 Daily 6)  Zegerid 40-1100 Mg  Caps (Omeprazole-Sodium Bicarbonate) .Marland Kitchen.. 1 By Mouth Daily 7)  Adult Aspirin Ec Low Strength 81 Mg  Tbec (Aspirin) .Marland Kitchen.. 1 By Mouth Daily 8)  Magnesium 250 Mg  Tabs (Magnesium) .Marland Kitchen.. 1 By Mouth Two Times A Day 9)  Eql Calcium 600 Mg  Tabs (Calcium Carbonate) .Marland Kitchen.. 1 By Mouth Two Times A Day 10)  Potassium  Gluconate 595 Mg  Tabs (Potassium Gluconate) .Marland Kitchen.. 1 By Mouth Daily 11)  Allegra 180 Mg  Tabs (Fexofenadine Hcl) .... Take 1 Tablet By Mouth Once A Day As Needed 12)  Nitrostat 0.4 Mg  Subl (Nitroglycerin) .... As Needed 13)  Furosemide 20 Mg  Tabs (Furosemide) .Marland Kitchen.. 1 By Mouth Daily 14)  Elmiron 100 Mg  Caps (Pentosan Polysulfate Sodium) .... Three Times A Day 15)  Trimethoprim 100 Mg  Tabs (Trimethoprim) .... Take 1 Tablet By Mouth Once A Day 16)  Pindolol 5 Mg Tabs (Pindolol) .... Take 1 Tablet By Mouth Once A Day 17)  Flonase 50 Mcg/act Susp (Fluticasone Propionate) .... 2 Sprays Once A Day 18)  Ultra C Tablets .Marland Kitchen.. 6 Tablets As Needed 19)  Flexeril 10 Mg Tabs (Cyclobenzaprine Hcl) .... Take 1 Tablet By Mouth Once A Day 20)  Meprozine 50/25 .... As Needed 21)  Flexcort Patches .... As Needed 22)  Fish Oil Maximum Strength 1200 Mg Caps (Omega-3 Fatty Acids) .... Take 1 Tablet By Mouth Two Times A Day  Allergies (verified): 1)  ! Verapamil 2)  ! * Diltiazem 3)  ! * Toprol 4)  ! Codeine 5)  ! 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  Pt is married with children. Pt is a Recruitment consultant  Risk Factors: Smoking Status: never (06/11/2008)  Review of Systems      See HPI  Vital  Signs:  Patient profile:   60 year old female Height:      66 inches Weight:      159.38 pounds BMI:     25.82  Vitals Entered By: Boone Master CNA (January 10, 2010 3:46 PM) CC: occ pain in left upper back wrapping around to the left breast when she takes a deep breath x8weeks. Is Patient Diabetic? No Comments Medications reviewed with patient Daytime contact number verified with patient. Boone Master CNA  January 10, 2010 3:46 PM    Physical Exam  Additional Exam:  GEN: A/Ox3; pleasant , NAD HEENT:  [place]/AT, , EACs-clear, TMs-wnl, NOSE-clear, THROAT-clear NECK:  Supple w/ fair ROM; no JVD; normal carotid impulses w/o bruits; no thyromegaly or nodules palpated; no lymphadenopathy. RESP  Clear to P & A; w/o, wheezes/ rales/ or rhonchi. CARD:  RRR, no m/r/g   GI:   Soft & nt; nml bowel sounds; no organomegaly or masses detected. Musco: Warm bil,  no calf tenderness edema, clubbing, pulses intact, tender along left post scapular area, no redness or rash noted. Nml ROM of shoulder . nml grips .  Neuro: EOM-wnl, PERRLA, CN 2-12 intact,nml equal grips/streng     Impression & Recommendations:  Problem # 1:  OSTEOARTHRITIS (ICD-715.90)  Mild flare w/ muscle spasm in upper back.  REC:  Ibuprofen 800mg  two times a day for 5 days w/ food.  Alternate ice and heat to area as needed  Increase flexeril 10mg  1/2 in am  and 1 at bedtime for 5 days Please contact office for sooner follow up if symptoms do not improve or worsen  follow up Dr. Delton Coombes  as scheduled.   Her updated medication list for this problem includes:    Adult Aspirin Ec Low Strength 81 Mg Tbec (Aspirin) .Marland Kitchen... 1 by mouth daily  Orders: Est. Patient Level III (16109)  Complete Medication List: 1)  Estrace 2 Mg Tabs (Estradiol) .Marland Kitchen.. 1 by mouth daily 2)  Colchicine 0.6 Mg Tabs (Colchicine) .Marland Kitchen.. 1 by mouth daily 3)  Digoxin 0.25 Mg Tabs (Digoxin) .Marland Kitchen.. 1 by mouth daily 4)  Lyrica 150 Mg Caps (Pregabalin) .... Take 1 tablet by  mouth two times a day 5)  Folgard  .Marland Kitchen.. 1 daily 6)  Zegerid 40-1100 Mg Caps (Omeprazole-sodium bicarbonate) .Marland Kitchen.. 1 by mouth daily 7)  Adult Aspirin Ec Low Strength 81 Mg Tbec (Aspirin) .Marland Kitchen.. 1 by mouth daily 8)  Magnesium 250 Mg Tabs (Magnesium) .Marland Kitchen.. 1 by mouth two times a day 9)  Eql Calcium 600 Mg Tabs (Calcium carbonate) .Marland Kitchen.. 1 by mouth two times a day 10)  Potassium Gluconate 595 Mg Tabs (Potassium gluconate) .Marland Kitchen.. 1 by mouth daily 11)  Allegra 180 Mg Tabs (Fexofenadine hcl) .... Take 1 tablet by mouth once a day as needed 12)  Nitrostat 0.4 Mg Subl (Nitroglycerin) .... As needed 13)  Furosemide 20 Mg Tabs (Furosemide) .Marland Kitchen.. 1 by mouth daily 14)  Elmiron 100 Mg Caps (Pentosan polysulfate sodium) .... Three times a day 15)  Trimethoprim 100 Mg Tabs (Trimethoprim) .Marland KitchenMarland KitchenMarland Kitchen  Take 1 tablet by mouth once a day 16)  Pindolol 5 Mg Tabs (Pindolol) .... Take 1 tablet by mouth once a day 17)  Flonase 50 Mcg/act Susp (Fluticasone propionate) .... 2 sprays once a day 18)  Ultra C Tablets  .Marland Kitchen.. 6 tablets as needed 19)  Flexeril 10 Mg Tabs (Cyclobenzaprine hcl) .... Take 1 tablet by mouth once a day 20)  Flexcort Patches  .... As needed 21)  Fish Oil Maximum Strength 1200 Mg Caps (Omega-3 fatty acids) .... Take 1 tablet by mouth two times a day  Patient Instructions: 1)  Ibuprofen 800mg  two times a day for 5 days w/ food.  2)  Alternate ice and heat to area as needed  3)  Increase flexeril 10mg  1/2 in am  and 1 at bedtime for 5 days 4)  Please contact office for sooner follow up if symptoms do not improve or worsen  5)  follow up Dr. Delton Coombes  as scheduled.   Appended Document: vitals, meds update     Medications Prior to Update: 1)  Estrace 2 Mg  Tabs (Estradiol) .Marland Kitchen.. 1 By Mouth Daily 2)  Colchicine 0.6 Mg  Tabs (Colchicine) .Marland Kitchen.. 1 By Mouth Daily 3)  Digoxin 0.25 Mg  Tabs (Digoxin) .Marland Kitchen.. 1 By Mouth Daily 4)  Lyrica 150 Mg  Caps (Pregabalin) .... Take 1 Tablet By Mouth Two Times A Day 5)  Folgard .Marland KitchenMarland Kitchen.  1 Daily 6)  Zegerid 40-1100 Mg  Caps (Omeprazole-Sodium Bicarbonate) .Marland Kitchen.. 1 By Mouth Daily 7)  Adult Aspirin Ec Low Strength 81 Mg  Tbec (Aspirin) .Marland Kitchen.. 1 By Mouth Daily 8)  Magnesium 250 Mg  Tabs (Magnesium) .Marland Kitchen.. 1 By Mouth Two Times A Day 9)  Eql Calcium 600 Mg  Tabs (Calcium Carbonate) .Marland Kitchen.. 1 By Mouth Two Times A Day 10)  Potassium Gluconate 595 Mg  Tabs (Potassium Gluconate) .Marland Kitchen.. 1 By Mouth Daily 11)  Allegra 180 Mg  Tabs (Fexofenadine Hcl) .... Take 1 Tablet By Mouth Once A Day As Needed 12)  Nitrostat 0.4 Mg  Subl (Nitroglycerin) .... As Needed 13)  Furosemide 20 Mg  Tabs (Furosemide) .Marland Kitchen.. 1 By Mouth Daily 14)  Elmiron 100 Mg  Caps (Pentosan Polysulfate Sodium) .... Three Times A Day 15)  Trimethoprim 100 Mg  Tabs (Trimethoprim) .... Take 1 Tablet By Mouth Once A Day 16)  Pindolol 5 Mg Tabs (Pindolol) .... Take 1 Tablet By Mouth Once A Day 17)  Flonase 50 Mcg/act Susp (Fluticasone Propionate) .... 2 Sprays Once A Day 18)  Ultra C Tablets .Marland Kitchen.. 6 Tablets As Needed 19)  Flexeril 10 Mg Tabs (Cyclobenzaprine Hcl) .... Take 1 Tablet By Mouth Once A Day 20)  Flexcort Patches .... As Needed 21)  Fish Oil Maximum Strength 1200 Mg Caps (Omega-3 Fatty Acids) .... Take 1 Tablet By Mouth Two Times A Day  Current Medications (verified): 1)  Estrace 2 Mg  Tabs (Estradiol) .Marland Kitchen.. 1 By Mouth Daily 2)  Colchicine 0.6 Mg  Tabs (Colchicine) .Marland Kitchen.. 1 By Mouth Daily As Needed 3)  Digoxin 0.25 Mg  Tabs (Digoxin) .Marland Kitchen.. 1 By Mouth Daily 4)  Lyrica 150 Mg  Caps (Pregabalin) .... Take 1 Tablet By Mouth Two Times A Day 5)  Folgard .Marland KitchenMarland Kitchen. 1 Daily 6)  Zegerid 40-1100 Mg  Caps (Omeprazole-Sodium Bicarbonate) .Marland Kitchen.. 1 By Mouth Daily 7)  Adult Aspirin Ec Low Strength 81 Mg  Tbec (Aspirin) .Marland Kitchen.. 1 By Mouth Daily 8)  Magnesium 250 Mg  Tabs (Magnesium) .Marland Kitchen.. 1 By Mouth Two  Times A Day 9)  Eql Calcium 600 Mg  Tabs (Calcium Carbonate) .Marland Kitchen.. 1 By Mouth Two Times A Day 10)  Potassium Gluconate 595 Mg  Tabs (Potassium Gluconate) .Marland Kitchen.. 1  By Mouth Daily 11)  Allegra 180 Mg  Tabs (Fexofenadine Hcl) .... Take 1 Tablet By Mouth Once A Day 12)  Nitrostat 0.4 Mg Subl (Nitroglycerin) .Marland Kitchen.. 1 Under Tongue Every 5 Minutes X3 As Needed Chest Pain 13)  Furosemide 20 Mg  Tabs (Furosemide) .Marland Kitchen.. 1 By Mouth Daily 14)  Elmiron 100 Mg  Caps (Pentosan Polysulfate Sodium) .... Three Times A Day 15)  Trimethoprim 100 Mg  Tabs (Trimethoprim) .... Take 1 Tablet By Mouth Once A Day 16)  Pindolol 5 Mg Tabs (Pindolol) .... Take 1 Tablet By Mouth Once A Day 17)  Flonase 50 Mcg/act Susp (Fluticasone Propionate) .... 2 Sprays Once A Day As Needed 18)  Ultra C Tablets .Marland Kitchen.. 6 Tablets As Needed 19)  Flexeril 10 Mg Tabs (Cyclobenzaprine Hcl) .... Take 1 Tab By Mouth At Bedtime 20)  Flexcort Patches .... As Needed 21)  Fish Oil Maximum Strength 1200 Mg Caps (Omega-3 Fatty Acids) .... Take 1 Tablet By Mouth Two Times A Day 22)  Lidoderm 5 % Ptch (Lidocaine) .... Apply As Needed 23)  Cephalexin 250 Mg Tabs (Cephalexin) .... Take 1 Tablet By Mouth Four Times A Day X7days - Began 01-06-10 24)  Ibuprofen 800 Mg Tabs (Ibuprofen) .... Use As Directed 25)  Cpap .... Wear At Bedtime  Allergies (verified): 1)  ! Verapamil 2)  ! * Diltiazem 3)  ! * Toprol 4)  ! Codeine 5)  ! Macrodantin  Past History:  Family History: Last updated: 06/11/2008 asthma-sisters, mat grandmother, mother emphysema-sister heart disease-mother-deceased, father-deceased, mat grandmother rheumatism-sister pancreatic cancer- mat grandfather pulmonary fibrosis-sister  Social History: Last updated: 06/11/2008 Patient never smoked.  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 No    Medications Added to Medication List This Visit: 1)  Colchicine 0.6 Mg Tabs (Colchicine) .Marland Kitchen.. 1 by mouth daily as needed 2)  Allegra 180 Mg Tabs (Fexofenadine hcl) .... Take 1 tablet by mouth once a day 3)  Nitrostat 0.4 Mg Subl (Nitroglycerin) .Marland Kitchen.. 1 under tongue every 5 minutes x3 as needed chest pain 4)  Flonase 50 Mcg/act Susp (Fluticasone propionate) .... 2 sprays once a day as needed 5)  Flexeril 10 Mg Tabs (Cyclobenzaprine hcl) .... Take 1 tab by mouth at bedtime 6)  Lidoderm 5 % Ptch (Lidocaine) .... Apply as needed 7)  Cephalexin 250 Mg Tabs (Cephalexin) .... Take 1 tablet by mouth four times a day x7days - began 01-06-10 8)  Ibuprofen 800 Mg Tabs (Ibuprofen) .... Use as directed 9)  Cpap  .... Wear at bedtime

## 2010-11-25 NOTE — Progress Notes (Signed)
Summary: Dr Mia [name] callingto discuss pt  ---- Converted from flag ---- ---- 11/12/2008 3:39 PM, Rickard Patience wrote: DR Marylynn Pearson TO TALK TO YOU ABOUT DR Deoni Cosey'S PT Liese Rayo DOB 11-05-2050 HE CAN BE REACHED AT [phone] ------------------------------  Phone Note From Other Clinic Call back at [phone]   Caller: Dr Mia [name] Call For: Delton Coombes or RN Summary of Call: Attempted to call Dr Mia [name] back.  He is still at the hospital left call back #, for him TCB when returns to the office. Initial call taken by: Cloyde Reams RN,  November 13, 2008 12:14 PM  Follow-up for Phone Call        Spoke with Dr Mia [name].  States pt has been an enigma as far as her SOB.  EKG had ST changes that are false positive, he cathed pt and cath was negative. He is aware of pt's CPST results and would like to discuss pt with you.  Advised you were on night's this week, however working in [facility] 11/15/08 in am.  Dr Mia [name] would like for you to page him Thurs am at your convience to discuss pt [phone]. Follow-up by: Cloyde Reams RN,  November 13, 2008 12:39 PM  Additional Follow-up for Phone Call Additional follow up Details #1::        Called on Friday 1/22, unable to reach. Will try again. RSB

## 2010-11-25 NOTE — Assessment & Plan Note (Signed)
Summary: dyspnea   Visit Type:  Follow-up Referred by:  Lady Deutscher  Chief Complaint:  Dyspnea.  History of Present Illness: 60 yo woman never smoker with hx paroxismal SVT, Holter showed correlation at the time with exertional dyspnea and lightheadedness. These sx have been better controlled with initiation of digoxin, but still has diziness when playing with the grandkids, cleaning the house. Feels SOB at these times as well. No cough. No wheeze. Sometimes with talking she has difficulty getting a breath in. Has had episodes at night when she jerks awake - hasn't had this since last visit.  Here to review PFT's  She does snore some per her husband.    Of note, had a water heater leak with water damage and mold exposure - probably for years. Repairs still being done, initiated 3 weeks ago.   CXR in 6/09 - no infiltrates.      Prior Medications Reviewed Using: Patient Recall  Updated Prior Medication List: ESTRACE 2 MG  TABS (ESTRADIOL) 1 by mouth daily COLCHICINE 0.6 MG  TABS (COLCHICINE) 1 by mouth daily DIGOXIN 0.25 MG  TABS (DIGOXIN) 1 by mouth daily LYRICA 150 MG  CAPS (PREGABALIN) Take 1 tablet by mouth two times a day * FOLGARD 1 daily ZEGERID 40-1100 MG  CAPS (OMEPRAZOLE-SODIUM BICARBONATE) 1 by mouth daily ADULT ASPIRIN EC LOW STRENGTH 81 MG  TBEC (ASPIRIN) 1 by mouth daily MAGNESIUM 250 MG  TABS (MAGNESIUM) 1 by mouth two times a day EQL CALCIUM 600 MG  TABS (CALCIUM CARBONATE) 1 by mouth two times a day POTASSIUM GLUCONATE 595 MG  TABS (POTASSIUM GLUCONATE) 1 by mouth daily * ULYTA C every 6  as needed ALLEGRA 180 MG  TABS (FEXOFENADINE HCL) Take 1 tablet by mouth once a day as needed NITROSTAT 0.4 MG  SUBL (NITROGLYCERIN) as needed FUROSEMIDE 20 MG  TABS (FUROSEMIDE) 1 by mouth daily ELMIRON 100 MG  CAPS (PENTOSAN POLYSULFATE SODIUM) three times a day TRIMETHOPRIM 100 MG  TABS (TRIMETHOPRIM) Take 1 tablet by mouth once a day  Current Allergies (reviewed today): !  VERAPAMIL ! * DILTIAZEM ! * TOPROL ! CODEINE ! MACRODANTIN      Vital Signs:  Patient Profile:   60 Years Old Female Height:     66 inches Weight:      150 pounds O2 Sat:      99 % O2 treatment:    Room Air Temp:     97.9 degrees F oral Pulse rate:   74 / minute BP sitting:   120 / 60  (left arm) Cuff size:   regular  Vitals Entered By: Cloyde Reams RN (July 24, 2008 2:54 PM)             Comments Pt is here today for a follow-up visit with PFT's. Pt states breathing OK overall.  Pt states gets SOB with exertional activity. Medications reviewed Cloyde Reams RN  July 24, 2008 3:17 PM        Pulmonary Function Test Date: 07/24/2008 Height (in.): 66 Gender: Female  Pre-Spirometry FVC    Value: 3.34 L/min   Pred: 3.39 L/min     % Pred: 99 % FEV1    Value: 2.73 L     Pred: 2.54 L     % Pred: 107 % FEV1/FVC  Value: 82 %     Pred: 74 %     % Pred: - % FEF 25-75  Value: 3.17 L/min   Pred: 2.83 L/min     %  Pred: 112 %  Post-Spirometry FVC    Value: 3.40 L/min   Pred: 3.39 L/min     % Pred: 100 % FEV1    Value: 2.90 L     Pred: 2.54 L     % Pred: 114 % FEV1/FVC  Value: 85 %     Pred: 74 %     % Pred: - % FEF 25-75  Value: 3.71 L/min   Pred: 2.83 L/min     % Pred: 131 %  Lung Volumes TLC    Value: 5.01 L   % Pred: 94 % RV    Value: 1.54 L   % Pred: 78 % DLCO    Value: 19.0 %   % Pred: 88 % DLCO/VA  Value: 4.34 %   % Pred: 113 %  Comments: Normal airflows without a BD response by strict criteria, but the FEF 25-75% does change (? significance). Normal TLC with mildly reduced RV. Normal Diffusion capacity. RSB    Impression & Recommendations:  Problem # 1:  DYSPNEA ON EXERTION (ICD-786.09) PFT's reassuring with some possible evidence for a mild BD response. No clear etiology for her SOB  - will order CPEX to distinguish between possible causes, inculding possible decondtioning Orders: Pulmonary Referral (Pulmonary) Est. 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 Every 6  as needed   Patient Instructions: 1)  We will perform cardiopulomonary exercise testing.  2)  Follow up with Dr Delton Coombes in one month to review the results.   ]

## 2010-11-25 NOTE — Progress Notes (Signed)
Summary: C PAP MASK  Phone Note Call from Patient   Caller: [facility]  Call For: BYRUM Summary of Call: PT IS AT [facility] WANTING TO GET C PAP MASK PLEASE FAX TO [phone]. Initial call taken by: Rickard Patience,  September 04, 2010 4:02 PM  Follow-up for Phone Call        I faxed order in EMR to [facility] at fax number given. I called [facility] to advise order faxed. Not sure what locartion the pt is at?? The rep was going to call the branches to see where the pt ws and give them the order. She states she will call back if anything needed.  Carron Curie CMA  September 04, 2010 4:12 PM

## 2010-11-25 NOTE — Progress Notes (Signed)
Summary: CPEX results  Phone Note Call from Patient Call back at [phone]   Caller: Patient Call For: Jaymi Tinner Summary of Call: pt calling for cardio pulmonary results Initial call taken by: Rickard Patience,  September 07, 2008 10:20 AM  Follow-up for Phone Call        Unsure if CPST has been formally read by MR.  Haven't received results yet.  Attempted to call Renae Fickle, CPST and find out if study has been read by MR.  LMTCB. Cloyde Reams RN  September 07, 2008 11:02 AM   Attempted TCB pt.  LMOM TCB. Cloyde Reams RN  September 11, 2008 3:47 PM  Left another message forPaul to see if CPST on this pt has been read by MR.   Follow-up by: Cloyde Reams RN,  September 11, 2008 3:47 PM  Additional Follow-up for Phone Call Additional follow up Details #1::        PT AWARE WE WILL (PER MR) HAVE A RESULT 11/18 AFTER HE READS THIS HE SAID HE WOULD CALL [facility] AND GET RESULT TO HIM--PT WAS TOLD WE WOULD CALL HER 11/18 BY CLOSE OF BUISNESS Additional Follow-up by: Philipp Deputy CMA,  September 11, 2008 5:07 PM    Additional Follow-up for Phone Call Additional follow up Details #2::    reviewed cpst. Sorry for delay. - multiple factors in delay from laptop crashing due to virus around time test was done, access limited to only hospital/office in reading tests, and test itself having some complex data that needed careful interpretation.  Test shows predominantly circulatory limiation to exercise (ST depression in EKG, chest pain at exercise, submaximal heart rate response - ? dig effect, and reduced OUES slope). Associated possible asthma could not be ruled out although unlikely but would need a separate EIB study. Kalman Shan MD  September 12, 2008 8:04 AM  Follow-up by: Kalman Shan MD,  September 12, 2008 8:04 AM  Additional Follow-up for Phone Call Additional follow up Details #3:: Details for Additional Follow-up Action Taken: Discussed results with pt. I am concerned about inappropriate HR response  to exercise, as well as risk for CAD given ST changes with exercise. Last stress test was 02/2007. I will obtain methacholine challenge to confirm no inducible bronchospasm that merits BD therapy. Will review with her after performed. If no evidence for AFL then I believe she needs to revisit symptoms with Dr Reyes Ivan, probably have repeat stress test vs cath.  Additional Follow-up by: Leslye Peer MD,  September 12, 2008 2:58 PM

## 2010-11-25 NOTE — Progress Notes (Signed)
  Phone Note Other Incoming   Request: Send information Summary of Call: Request for records received from [facility] [place]. Request forwarded to [facility].

## 2010-11-25 NOTE — Progress Notes (Signed)
Summary: RESULTS  Phone Note Call from Patient Call back at [phone]   Caller: Patient Call For: [name] Summary of Call: RESULTS OF PFTS Initial call taken by: Rickard Patience,  October 31, 2008 4:23 PM  Follow-up for Phone Call        Please advise results.  Additional Follow-up for Phone Call Additional follow up Details #1::        Will discus with pt Additional Follow-up by: Leslye Peer MD,  October 31, 2008 4:59 PM

## 2011-01-06 LAB — COMPREHENSIVE METABOLIC PANEL
ALT: 29 U/L (ref 0–35)
AST: 24 U/L (ref 0–37)
Albumin: 3.8 g/dL (ref 3.5–5.2)
Alkaline Phosphatase: 79 U/L (ref 39–117)
BUN: 13 mg/dL (ref 6–23)
CO2: 29 mEq/L (ref 19–32)
Calcium: 9.5 mg/dL (ref 8.4–10.5)
Chloride: 105 mEq/L (ref 96–112)
Creatinine, Ser: 1.1 mg/dL (ref 0.4–1.2)
GFR calc Af Amer: >60 mL/min (ref >60)
GFR calc non Af Amer: 51 mL/min — ABNORMAL LOW (ref >60)
Glucose, Bld: 91 mg/dL (ref 70–99)
Potassium: 3.7 mEq/L (ref 3.5–5.1)
Sodium: 144 mEq/L (ref 135–145)
Total Bilirubin: 0.3 mg/dL (ref 0.3–1.2)
Total Protein: 6.8 g/dL (ref 6.0–8.3)

## 2011-01-06 LAB — CBC
HCT: 35.3 % — ABNORMAL LOW (ref 36.0–46.0)
Hemoglobin: 12.5 g/dL (ref 12.0–15.0)
MCH: 33.7 pg (ref 26.0–34.0)
MCHC: 35.4 g/dL (ref 30.0–36.0)
MCV: 95 fL (ref 78.0–100.0)
Platelets: 325 10*3/uL (ref 150–400)
RBC: 3.72 MIL/uL — ABNORMAL LOW (ref 3.87–5.11)
RDW: 12.2 % (ref 11.5–15.5)
WBC: 10.3 10*3/uL (ref 4.0–10.5)

## 2011-01-06 LAB — URINALYSIS, ROUTINE W REFLEX MICROSCOPIC
Bilirubin Urine: NEGATIVE
Glucose, UA: NEGATIVE mg/dL
Hgb urine dipstick: NEGATIVE
Ketones, ur: NEGATIVE mg/dL
Nitrite: NEGATIVE
Protein, ur: NEGATIVE mg/dL
Specific Gravity, Urine: 1.009 (ref 1.005–1.030)
Urobilinogen, UA: 0.2 mg/dL (ref 0.0–1.0)
pH: 6 (ref 5.0–8.0)

## 2011-01-06 LAB — T4, FREE: Free T4: 0.96 ng/dL (ref 0.80–1.80)

## 2011-01-06 LAB — DIFFERENTIAL
Basophils Absolute: 0.1 10*3/uL (ref 0.0–0.1)
Basophils Relative: 1 % (ref 0–1)
Eosinophils Absolute: 0.1 10*3/uL (ref 0.0–0.7)
Eosinophils Relative: 1 % (ref 0–5)
Lymphocytes Relative: 25 % (ref 12–46)
Lymphs Abs: 2.5 10*3/uL (ref 0.7–4.0)
Monocytes Absolute: 0.9 10*3/uL (ref 0.1–1.0)
Monocytes Relative: 9 % (ref 3–12)
Neutro Abs: 6.7 10*3/uL (ref 1.7–7.7)
Neutrophils Relative %: 65 % (ref 43–77)

## 2011-01-06 LAB — POCT CARDIAC MARKERS
CKMB, poc: 1.3 ng/mL (ref 1.0–8.0)
Myoglobin, poc: 137 ng/mL (ref 12–200)
Troponin i, poc: <0.05 ng/mL (ref 0.00–0.09)

## 2011-01-06 LAB — POCT B-TYPE NATRIURETIC PEPTIDE (BNP): B Natriuretic Peptide, POC: 20.9 pg/mL (ref 0–100)

## 2011-01-06 LAB — DIGOXIN LEVEL: Digoxin Level: 0.9 ng/mL (ref 0.8–2.0)

## 2011-01-06 LAB — TSH: TSH: 3.376 u[IU]/mL (ref 0.350–4.500)

## 2011-01-28 LAB — BASIC METABOLIC PANEL
BUN: 14 mg/dL (ref 6–23)
CO2: 30 mEq/L (ref 19–32)
Calcium: 9.5 mg/dL (ref 8.4–10.5)
Chloride: 104 mEq/L (ref 96–112)
Creatinine, Ser: 0.9 mg/dL (ref 0.4–1.2)
GFR calc Af Amer: >60 mL/min (ref >60)
GFR calc non Af Amer: >60 mL/min (ref >60)
Glucose, Bld: 93 mg/dL (ref 70–99)
Potassium: 3.7 mEq/L (ref 3.5–5.1)
Sodium: 144 mEq/L (ref 135–145)

## 2011-01-28 LAB — CBC
HCT: 37.8 % (ref 36.0–46.0)
Hemoglobin: 12.9 g/dL (ref 12.0–15.0)
MCHC: 34.1 g/dL (ref 30.0–36.0)
MCV: 96.6 fL (ref 78.0–100.0)
Platelets: 334 10*3/uL (ref 150–400)
RBC: 3.91 MIL/uL (ref 3.87–5.11)
RDW: 12.8 % (ref 11.5–15.5)
WBC: 10.3 10*3/uL (ref 4.0–10.5)

## 2011-01-28 LAB — DIFFERENTIAL
Basophils Absolute: 0.1 10*3/uL (ref 0.0–0.1)
Basophils Relative: 1 % (ref 0–1)
Eosinophils Absolute: 0.1 10*3/uL (ref 0.0–0.7)
Eosinophils Relative: 1 % (ref 0–5)
Lymphocytes Relative: 29 % (ref 12–46)
Lymphs Abs: 3 10*3/uL (ref 0.7–4.0)
Monocytes Absolute: 0.7 10*3/uL (ref 0.1–1.0)
Monocytes Relative: 7 % (ref 3–12)
Neutro Abs: 6.4 10*3/uL (ref 1.7–7.7)
Neutrophils Relative %: 63 % (ref 43–77)

## 2011-01-28 LAB — PROTIME-INR
INR: 0.98 (ref 0.00–1.49)
Prothrombin Time: 12.9 seconds (ref 11.6–15.2)

## 2011-01-28 LAB — D-DIMER, QUANTITATIVE: D-Dimer, Quant: 0.23 ug/mL-FEU (ref 0.00–0.48)

## 2011-02-02 ENCOUNTER — Ambulatory Visit: Payer: Self-pay | Admitting: Cardiovascular Disease

## 2011-02-09 MED ORDER — BETAMETHASONE VALERATE 0.1 % TOPICAL CREAM
0.1 % | Freq: Two times a day (BID) | CUTANEOUS | Status: DC
Start: 2011-02-09 — End: 2013-03-27

## 2011-02-09 NOTE — Patient Instructions (Addendum)
MyChart Activation    Thank you for requesting access to MyChart. Please follow the instructions below to securely access and download your online medical record. MyChart allows you to send messages to your doctor, view your test results, renew your prescriptions, schedule appointments, and more.    How Do I Sign Up?    1. In your internet browser, go to [email or web address]  2. Click on the First Time User? Click Here link in the Sign In box. You will be redirect to the New Member Sign Up page.  3. Enter your MyChart Access Code exactly as it appears below. You will not need to use this code after you???ve completed the sign-up process. If you do not sign up before the expiration date, you must request a new code.    MyChart Access Code: [email or web address]  Expires: 05/10/2011  2:35 PM (This is the date your MyChart access code will expire)    4. Enter the last four digits of your Social Security Number (xxxx) and Date of Birth (mm/dd/yyyy) as indicated and click Submit. You will be taken to the next sign-up page.  5. Create a MyChart ID. This will be your MyChart login ID and cannot be changed, so think of one that is secure and easy to remember.  6. Create a MyChart password. You can change your password at any time.  7. Enter your Password Reset Question and Answer. This can be used at a later time if you forget your password.   8. Enter your e-mail address. You will receive e-mail notification when new information is available in MyChart.  9. Click Sign Up. You can now view and download portions of your medical record.  10. Click the Download Summary menu link to download a portable copy of your medical information.    Additional Information    If you have questions, please call [phone]. Remember, MyChart is NOT to be used for urgent needs. For medical emergencies, dial 911.      Use the Valisone cream as directed  Use Zyrtec or Allegra and Benadryl at bedtime as needed for itching   Observe for signs of infection  Call if symptoms persist or worsen      Discontinue Neosporin and use Bacitracin to toe wound  Keep the toe wound covered until the wound is dry and a scab has formed.  Observe for signs of infection

## 2011-02-09 NOTE — Progress Notes (Signed)
HISTORY OF PRESENT ILLNESS  Laura Roman is a 60 y.o. white female patient of Dr. Rubie Maid.  HPI  She was working in her yard yesterday. She was bitten by flies over her arms. The bites are quite pruritic. There is an area over the left forearm that is mildly pruritic with increasing redness. She denies tick bite. She denies fevers, chills, sweats, rash, or systemic symptoms. She applied hydrogen peroxide to the bites as well as Benadryl spray.    She also stepped on a tree branch that pierced her shoe and punctured her right third toe. She was able to remove a residual piece of splinter. she cleansed the wound with hydrogen peroxide. She has been applying antibiotic ointment and keeping it bandaged.       Patient Active Problem List   Diagnoses Code   ??? Other and unspecified hyperlipidemia 272.4   ??? Impaired fasting glucose 790.21   ??? Unspecified vitamin D deficiency 268.9   ??? DJD (degenerative joint disease) 715.90   ??? Anxiety 300.00   ??? Hyperlipidemia 272.4   ??? Syncope 780.2       Allergies   Allergen Reactions   ??? Fish Oil Nausea Only   ??? Relafen (Nabumetone) Nausea Only       History   Substance Use Topics   ??? Smoking status: Never Smoker    ??? Smokeless tobacco: Not on file   ??? Alcohol Use: No       Immunization History:    Last Td reportedly years ago.        ROS  Otherwise negative.    BP 108/62   Pulse 92   Temp(Src) 98.5 ??F (36.9 ??C) (Oral)   Ht 5\' 4"  (1.626 m)   Wt 158 lb (71.668 kg)   BMI 27.12 kg/m2    Physical Exam   [vitalsreviewed.  Constitutional: She is oriented to person, place, and time. She appears well-developed and well-nourished. No distress.   Eyes: No scleral icterus.   Neurological: She is alert and oriented to person, place, and time.   Skin: Skin is warm and dry. No rash noted. She is not diaphoretic.         There are several mildly erythematous papules over the upper outer arms consistent with insect bites. There is an erythematous 3 x 3.5 cm area of what appeared to be ecchymosis and mild erythema over the volar aspect of the left forearm. There was a central papule. There was no sign of necrosis. There was no open wound or discharge. There was no appreciable tenderness or warmth. There was no sign of cellulitis or lymphangitis. There was no epitrochlear adenopathy.     There was a small, superficial puncture wound over the plantar aspect of the right third toe. There is no discharge or swelling or erythema. The wound is dry without sign of purulence or infection. The toe is warm with brisk capillary refill. Toe range of motion is intact.        ASSESSMENT and PLAN  1. Insect bites. Treatment with Valisone cream bid. She may use Zyrtec or Allegra in the morning and Benadryl at bedtime as needed for itching. She will call with an update in 3 days; sooner if her symptoms worsen.   2. Puncture wound right third toe. She will discontinue Neosporin. She will use Bacitracin and keep the wound covered with a bandaid. She will monitor for signs of infection and promptly call if this occurs.  3. Tdap to be  done today.       Return as scheduled with Dr. Tyler Pita.

## 2011-02-09 NOTE — Progress Notes (Signed)
I reviewed the patient's medical history, the nurse practitioner's findings on physical examination, the patient's diagnoses, and treatment plan as documented in the progress note. I concur with the treatment plan as documented. There are no additional recommendations at this time.

## 2011-02-12 NOTE — Telephone Encounter (Addendum)
Left message on voice mail regarding her insect bites and her toe wound - no infection ?

## 2011-02-13 ENCOUNTER — Encounter: Payer: Self-pay | Admitting: Cardiovascular Disease

## 2011-02-13 DIAGNOSIS — I471 Supraventricular tachycardia: Secondary | ICD-10-CM

## 2011-02-13 DIAGNOSIS — G4730 Sleep apnea, unspecified: Secondary | ICD-10-CM | POA: Insufficient documentation

## 2011-02-13 DIAGNOSIS — I7300 Raynaud's syndrome without gangrene: Secondary | ICD-10-CM | POA: Insufficient documentation

## 2011-02-13 NOTE — Telephone Encounter (Signed)
Pt called -please call back at [phone]

## 2011-02-13 NOTE — Telephone Encounter (Signed)
Still little itchy but much better- almost gone

## 2011-02-17 ENCOUNTER — Ambulatory Visit: Payer: Self-pay | Admitting: Cardiovascular Disease

## 2011-03-03 ENCOUNTER — Ambulatory Visit: Payer: Self-pay | Admitting: Cardiovascular Disease

## 2011-03-03 LAB — METABOLIC PANEL, COMPREHENSIVE
A-G Ratio: 1.4 (ref 0.6–2.2)
ALT (SGPT): 16 U/L (ref 10–40)
AST (SGOT): 18 U/L (ref 9–44)
Albumin: 3.9 g/dL (ref 3.5–5.3)
Alk. phosphatase: 77 U/L (ref 42–98)
BUN: 13 mg/dL (ref 7–25)
Bilirubin, total: 0.5 mg/dL (ref 0.2–1.3)
CO2: 27.8 mEq/L (ref 25.0–34.5)
Calcium: 8.9 mg/dL (ref 8.1–10.4)
Chloride: 107 mEq/L (ref 94–112)
Creatinine: 0.71 mg/dL (ref 0.40–1.40)
GFR est non-AA: 94 mL/min/{1.73_m2} (ref >59)
GLOBULIN, TOTAL: 2.7 g/dL (ref 2.0–4.8)
Glucose: 113 mg/dL — ABNORMAL HIGH (ref 70–100)
Potassium: 4.1 mEq/L (ref 3.6–5.5)
Protein, total: 6.6 g/dL (ref 6.3–8.5)
Sodium: 143 mEq/L (ref 135–145)
eGFR If African American: 108 mL/min/{1.73_m2} (ref >59)

## 2011-03-04 LAB — VITAMIN D, 25 HYDROXY: VITAMIN D, 25-HYDROXY: 18.4 ng/mL — ABNORMAL LOW (ref 30.0–100.0)

## 2011-03-04 LAB — HEMOGLOBIN A1C WITH EAG: Hemoglobin A1c: 6.2 % — ABNORMAL HIGH (ref 4.8–5.6)

## 2011-03-06 LAB — NMR LIPOPROFILE WITH LIPIDS (WITHOUT GRAPH)
Cholesterol, Total: 177 mg/dL (ref <200)
HDL-C: 43 mg/dL (ref >=40)
HDL-P (Total): 30.2 umol/L — ABNORMAL LOW (ref >=30.5)
LDL size: 20.5 nm — ABNORMAL LOW (ref >20.5)
LDL-C: 102 mg/dL — ABNORMAL HIGH (ref <100)
LDL-P: 1898 nmol/L — ABNORMAL HIGH (ref <1000)
LP-IR SCORE: 70 — ABNORMAL HIGH (ref <=45)
Small LDL-P: 1130 nmol/L — ABNORMAL HIGH (ref <=527)
Triglycerides: 161 mg/dL — ABNORMAL HIGH (ref <150)

## 2011-03-10 NOTE — Procedures (Signed)
NAME:  Mia [name], Mia [name] NO.:  [id number]   MEDICAL RECORD NO.:  [id number]          PATIENT TYPE:  REC   LOCATION:  [facility]                          FACILITY:  [facility]   PHYSICIAN:  Erick Colace, M.D.DATE OF BIRTH:  10-28-1950   DATE OF PROCEDURE:  DATE OF DISCHARGE:                               OPERATIVE REPORT   PROCEDURE PERFORMED:  Trigger point injection, extensor digitorum  communis.   PHYSICIAN:  Erick Colace, M.D.   DESCRIPTION OF THE PROCEDURE:  The area was marked over the extensor  carpi radialis longus as well as the extensor digitorum.  The area was  prepped with Betadine and alcohol.  The area was entered with 25-gauge  1.5 inch  needle and 1 mL of 1% lidocaine was injected into each of the  three sites;  one on the extensor digitorum communis, one on the  extensor carpal radialis longus and one on the extensor carpi radialis  brevis.   The patient tolerated the procedure well.  Postinjection instructions  were given.  She is to resume the splintware.      Erick Colace, M.D.  Electronically Signed     AEK/MEDQ  D:  01/06/2008 17:01:48  T:  01/08/2008 01:53:35  Job:  [id number]

## 2011-03-10 NOTE — Procedures (Signed)
DUPLEX DEEP VENOUS EXAM - LOWER EXTREMITY   INDICATION:  Bilateral calf edema, left greater than right.   HISTORY:  Edema:  Bilateral.  Lower lower calf and foot for several  weeks.  Trauma/Surgery:  No.  Pain:  tight  PE:  No.  Previous DVT:  No.  Anticoagulants:  No.  Other:  The patient has had 10 surgeries on the right thigh and knee in  the past.   DUPLEX EXAM:                CFV   SFV   PopV  PTV    GSV                R  L  R  L  R  L  R   L  R  L  Thrombosis    0  0  0  0  0  0  0   0  0  0  Spontaneous   +  +  +  +  +  +  +   +  +  +  Phasic        +  +  +  +  +  +  +   +  Augmentation  +  +  +  +  +  +  +   +  Compressible  +  +  +  +  +  +  +   +  +  +     Competent   +     +     +     +     +     +     +     +     +     +     Legend:  + - yes  o - no  p - partial  D - decreased   Both superficial femoral veins were noted to be bifid with both limbs  being compressible.   IMPRESSION:  1. No evidence of bilateral deep vein thrombosis.  2. Both greater saphenous veins were competent.  3. A preliminary copy was faxed to Dr. Silva Bandy office.    _____________________________  Janetta Hora. Darrick Penna, MD   DP/MEDQ  D:  08/29/2007  T:  08/30/2007  Job:  [id number]

## 2011-03-10 NOTE — Procedures (Signed)
DUPLEX DEEP VENOUS EXAM - LOWER EXTREMITY   INDICATION:  Edema.   HISTORY:  Edema:  Yes.  Trauma/Surgery:  Multiple.  Pain:  No.  PE:  No.  Previous DVT:  No.  Anticoagulants:  No.  Other:  Lasix.   DUPLEX EXAM:                CFV   SFV   PopV  PTV      GSV                R  L  R  L  R  L  R    L   R   L  Thrombosis    o  o  o  o  o  o  o    o   +   o  Spontaneous   +  +  +  +  +  +  NV   NV  NV  NV  Phasic        +  +  +  +  +  +  NV   NV  NV  NV  Augmentation  +  +  +  +  +  +  +    +   +   +  Compressible  +  +  +  +  +  +  +    +   o   +  Competent     +  +  +  +  +  +  +    +   +   +   Legend:  + - yes  o - no  p - partial  D - decreased   IMPRESSION:  1. No evidence of deep venous thrombosis in bilateral lower      extremities.  2. 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  3. Bilateral greater saphenous vein and branches are competent.  4. Bilateral lesser saphenous vein is competent.      _____________________________  Quita Skye. Hart Rochester, M.D.   LT/MEDQ  D:  09/30/2010  T:  09/30/2010  Job:  [id number]

## 2011-03-10 NOTE — Assessment & Plan Note (Signed)
HISTORY OF PRESENT ILLNESS:  A 60 year old female seen for followup of  myalgias as well as other pain complaints. She has lateral epicondylitis  of the elbow as well. She is seeing physical therapy, [facility].  She has had no significant improvement after some iontophoresis,  phonophoresis. Some ultrasound without the phonophoresis did help  somewhat. We started her on some Limbrel, which was somewhat helpful for  her pain. Increased her Lyrica to 75 t.i.d., which hash been helpful for  her fibromyalgia pain.   Her average pain is 5 out of 10, currently 4 out of 10, mainly in the  right forearm and left knee. Her main forearm complaints is pain with  finger extension, as well as with grabbing objects. The pain is at the  lateral epicondyle but even more so, more distal in the dorsal forearm  musculature. She has no numbness in the hand, no dropping objects, just  feeling weak because of pain. Her sleep is good. She has good relief  from medications. Has been prescribed Mepergan on a p.r.n. basis. Her  urine drug screen was negative for illicit drugs or non-disclosed  opiates.   ALLERGIES:  She has multiple drug allergies including DARVON, ULTRAM,  CODEINE.   MEDICATIONS:  She has tried Voltaren gel to the right elbow. This has  only been minimally helpful. Lidoderm patch also only minimally helpful.   PHYSICAL EXAMINATION:  VITAL SIGNS:  Blood pressure 151/88, pulse 60,  respiratory rate 18, O2 saturation 100% on room air.  GENERAL:  No acute distress.  NEUROLOGIC:  Mood and affect appropriate.  EXTREMITIES:  She complains of swelling of her forearm. Good measurement  of the right and left symptomatic areas, distal to the lateral  epicondyle. The right forearm was 1 cm bigger than the left. She is  right hand dominant. She had tenderness over extensor digitorum longus,  as well as her carpi radialis longus and brevis muscle. These areas are  marked. She has pain with wrist  extension as well as with finger  extension that is resistant. Flexion of the wrist is not painful.  Passive stretching of the extensors is painful. She has normal strength  in the left deltoid, biceps and triceps and normal strength in the right  upper extremity deltoid, biceps, and triceps grip. Lower extremity  strength is normal and hip flexor, knee extensor, ankle and dorsiflexor  is 5/5. Shoulder range of motion is normal. Neck range of motion is  normal. Negative Spurling's.   IMPRESSION:  1. Right lateral epicondylitis, so far not being helped by physical      therapy. I do think she has a myofascial component to her pain,      particularly in extensors of the wrist and fingers of the right      upper extremity. We will try injecting today.  2. Fibromyalgia. Control is improving. I will increase her Lyrica to      150 b.i.d. She is tolerating 75 t.i.d. quite well.  3. Right knee intra-articular cartilage disorder. Doing okay with      this. May benefit from Acupuncture.   PLAN:  I will see her back in about 2 weeks. Will increase her Limbrel  to 500 b.i.d. See how she is doing after the injection. May need to do  corticosteroid injections of the lateral epicondyle. May consider  Acupuncture for lateral epicondylitis. Other options include Botox with  low-level laser.      Erick Colace, M.D.  Electronically  Signed    AEK/MedQ  D:  01/06/2008 17:07:31  T:  01/07/2008 12:31:57  Job #:  [id number]

## 2011-03-10 NOTE — Assessment & Plan Note (Signed)
HISTORY:  This is a 60 year old female sent for evaluation of  fibromyalgia as well as multiple other pain complaints.  She has a  history of leg quadriceps tendon rupture about 20 years ago.  She has  had problems with medial meniscal tear of right knee as well as history  of pseudogout.  She has also been diagnosed with some mild OA of the  knee.  She has had physical therapy for her shoulders.  She has had  multiple injections for trochanteric bursitis.   Our last visit, which was her initial, she had 7 tender points.  She  notes that since starting Lyrica she has had some reduction of skin  hypersensitivity,  but on the other hand, she has had side effects of  lower extremity edema, although this is relatively mild.   She has not followed through on the recommendation of aquatic exercises.  She has not tried Lidoderm patch other than the samples I gave her, over  her knee, and that was somewhat helpful per her report.  She continues  to have elbow pain but instead of more medial pain, it is more lateral,  and she has problems with filing items at work due to pain in the right  elbow.   Her chief complaint at this visit is the right elbow.  Her pain  interferes with activity at a mild to moderate range.  Sleep is good  when she gets to sleep.  She is able to climb steps although this does  cause pain, particularly in the knees.  She does drive.  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  She needs some assistance  with household duties and shopping.   REVIEW OF SYSTEMS:  Positive for bladder control problems.  She does  have interstitial cystitis, bladder spasms.  She has some numbness in  her hands.   PHYSICAL EXAMINATION:  VITAL SIGNS:  Her blood pressure is 145/56, pulse  62, respirations 18, oxygen saturation 98% on room air.  She is  accompanied by her husband today.   Her neck has full range of motion, negative Spurling's maneuver.  She  has positive  fibromyalgia tender points palpated at bilateral sternal  costal, bilateral upper traps, bilateral hip and bilateral lower back as  well as bilateral elbow.   Her motor strength is 5/5 bilateral deltoid, biceps, triceps grip as  well as hip flexor, knee extensor and ankle dorsiflexors.  Sensation is  intact in bilateral upper and lower extremities.  Extremities show no  evidence of edema.  She has full range of motion of shoulders, wrists,  elbows, hips, knees and ankles.  She has positive tenderness over the  right lateral epicondyle greater than medial epicondyle and left lateral  epicondyle.  She has no swelling, skin breakdown, no evidence of  cellulitis.   IMPRESSION:  1. Fibromyalgia syndrome.  2. Lateral epicondylitis right elbow.   PLAN:  1. Will do Voltaren gel over the right elbow.  2. Increase her Lyrica to 75 t.i.d. (gave her some samples).  3. See her back in two weeks.  4. Consider acupuncture.      Erick Colace, M.D.  Electronically Signed     AEK/MedQ  D:  12/23/2007 16:15:08  T:  12/24/2007 17:36:06  Job #:  [id number]   cc:   Kathryne Hitch, MD  Fax: [phone]   John L. Rendall, M.D.  Fax: [phone]

## 2011-03-10 NOTE — Procedures (Signed)
NAME:  Mia [name], TIEDEMAN NO.:  [id number]   MEDICAL RECORD NO.:  [id number]          PATIENT TYPE:  OUT   LOCATION:  SLEEP CENTER                 [facility]ACILITY[facility]   PHYSICIAN:  Clinton D. Maple Hudson, MD, FCCP, FACPDATE OF BIRTH:  10/27/1950   DATE OF STUDY:  02/27/2009                            NOCTURNAL POLYSOMNOGRAM   REFERRING PHYSICIAN:   REFERRING PHYSICIAN:  Pollyann Savoy, M.D.   INDICATION FOR STUDY:  Insomnia with sleep apnea.   EPWORTH SLEEPINESS SCORE:  Epworth sleepiness score 10/24, BMI 24.2,  weight 150 pounds, height 66 inches, neck 11.5 inches.   HOME MEDICATIONS:  Charted and reviewed.   SLEEP ARCHITECTURE:  Total sleep time 359 minutes with sleep efficiency  95.4%.  Stage I was 7%, stage II 73.6%, stage III 5.4%, REM 14% of total  sleep time.  Sleep latency 12 minutes, REM latency 157 minutes, awake  after sleep onset 7 minutes, arousal index 31.5.   BEDTIME MEDICATION:  Estrace, Lyrica, Elmiron, pindolol, Flexeril,  magnesium, calcium, vitamin D.   RESPIRATORY DATA:  Apnea/hypopnea index (AHI) 13.9 per hour.  A total of  83 events were scored including 11 obstructive apneas and 72 hypopneas.  Events were not positional.  REM AHI 54.7.  Total of 72 additional  respiratory effort related arousals were counted as event not meeting  duration and desaturation criteria to be scored as apneas or hypopneas  but still recognized as contributing to sleep disturbance, for total RDI  of 25.9 per hour.  This is a diagnostic NPSG protocol study as ordered.   OXYGEN DATA:  Moderate snoring with oxygen desaturation to a nadir of  81%.  Mean oxygen saturation through the study was 94% on room air.   CARDIAC DATA:  Normal sinus rhythm.   MOVEMENT-PARASOMNIA:  No significant movement disturbance.  Bathroom x1.  The patient reported a history of sleep talking which was not noted on  the study night.   IMPRESSIONS-RECOMMENDATIONS:  1. Mild-to-moderate  obstructive sleep apnea/hypopnea syndrome, AHI      13.9, RDI 25.9 per hour.  Events were present in all sleep      positions.  REM AHI 54.7 indicating events were more common during      REM.  Moderate snoring with oxygen desaturation to a nadir of 81%      on room air.  2. This is a diagnostic NPSG protocol.  Consider return for CPAP      titration or evaluate for alternative management as appropriate.      Clinton D. Maple Hudson, MD, [facility], FACP  Diplomate, Biomedical engineer of Sleep Medicine  Electronically Signed     CDY/MEDQ  D:  03/09/2009 13:48:23  T:  03/10/2009 08:26:17  Job:  [id number]

## 2011-03-10 NOTE — Assessment & Plan Note (Signed)
A 60 year old female seen for followup of fibromyalgia as well as  chronic right knee pain.  She has been increased on her Lyrica to 150  b.i.d.  She thinks that her overall body pain has improved.  She has in  the interval time  had a flareup of her pseudogout and this has been  managed by Dr. Brynda Greathouse who has increased her colchicine for joint flare  and now she is back to her once a day dosing pattern.   She has had some memory problems per her report, but does not really  tell me when it started.  She does not note any association with  medications.  Her sleep is good.  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   REVIEW OF SYSTEMS:  Otherwise negative.  She has been diagnosed with Vitamin-D deficiency and starting on Vitamin-  D supplementation.   EXAMINATION:  Blood pressure 120/64, pulse 61, respirations 18, 02  saturation 99% on room air.  General:  In no acute distress.  Mood and affect appropriate.  Palpation of fibromyalgia tender points shows positive bilateral traps  and bilateral medial knee but otherwise negative.  She has normal upper and lower extremity strength.  Upper and lower  extremity range of motion are normal.  She has very weak hip flexion  bilaterally.  Knee extension is at least a 4+/5 bilaterally.  Her knees show no obvious joint effusion.  No medial joint line  tenderness  on the left side, but on the right side there is tenderness  present.  Deep tendon reflexes are normal in the bilateral extremities.   IMPRESSION:  1. History of fibromyalgia.  Overall, she is doing well with this with      increased dose of Lyrica.  Some of her memory concentration      deficits may be related to her Lyrica but, given how well she is      doing with her pain, she is reluctant to reducing the dose.  If she      was to reduce the dose, I would go down to 100 b.i.d., but she was      not want to do so at this time.  2. Right knee intraarticular cartilage  disorder as well as pseudogout.      She has had a recent flareup but is back to baseline.  3. History of lateral epicondylitis, resolved.   PLAN:  1. We will continue her Lyrica 150 b.i.d.  2. I will see her back at 2 month followup.  3. She does have some Mepergan which I prescribed.  She has only taken      one since I saw her.  She uses this very intermittently.  4. She will be followed up with Orthopedics on the Doppler, lower      extremity.      Erick Colace, M.D.  Electronically Signed     AEK/MedQ  D:  02/07/2008 16:30:13  T:  02/07/2008 17:33:20  Job #:  [id number]

## 2011-03-10 NOTE — Consult Note (Signed)
NEW PATIENT CONSULTATION   Mia [name], Mia [name]  DOB:  1951-06-07                                       09/30/2010  ZOXWR#:[id number]   The patient is a 60 year old female who has been having worsening edema  over the last 4-6 weeks.  It has been worse in the left lower extremity  but involves both legs, mostly below the knee.  She has no history of  deep vein thrombosis, thrombophlebitis, pulmonary emboli, stasis ulcers  or varicosities.  She has had her normal dose of doubled 20 mg a day and  has also tried 15 days of spironolactone with no changes.  She has had a  CT scan of her abdomen apparently with no masses noted.  She has had  blood work which revealed normal renal function and liver function  studies apparently.  She has tried long-leg elastic compression  stockings but has problems with the toes of the left foot, but these are  not closed-toed stockings.  The swelling is gone in the morning when she  arises.   CHRONIC MEDICAL PROBLEMS:  1. Fibromyalgia.  2. Pseudogout.  3. Osteoarthritis.  4. Sleep apnea.  5. Raynaud's.  6. History of some type of cardiac arrhythmia with no coronary artery      disease, cardiac cath by Dr. Elease Hashimoto 6 years ago.  7. Negative for stroke or diabetes.   SOCIAL HISTORY:  She is married, has 2 children.  Is an HR Production designer, theatre/television/film.  Does not use tobacco or alcohol.   FAMILY HISTORY:  Positive for coronary artery disease in her mother,  severe peripheral vascular disease in her father, diabetes and stroke in  her grandmother.   REVIEW OF SYSTEMS:  Positive for leg discomfort with walking, pain in  the feet while lying flat, arthritis joint pain, muscle pain, dyspnea on  exertion, history of cardiac arrhythmias as noted, occasional rashes in  her lower extremities.  All other systems are negative in the complete  review of systems.   PHYSICAL EXAMINATION:  Blood pressure 123/74, heart rate 65,  respirations 16.  General:  She is a  well-developed, well-nourished  female in no apparent distress, alert and oriented x3.  HEENT:  Exam  normal for age.  EOMs intact.  Lungs:  Clear to auscultation.  No  rhonchi or wheezing.  Cardiovascular:  Regular rhythm.  No murmurs.  Carotid pulses 3+.  No audible bruits.  Abdomen:  Soft, nontender, with  no masses.  Musculoskeletal:  Free of major deformities.  Neurologic:  Normal.  Skin:  Free of rashes.  Lower extremity exam reveals 3+  femoral, popliteal and dorsalis pedis pulses palpable.  Both legs appear  to be edematous, left worse than right.  The left calf is 1 cm larger in  circumference than the right.  Distal thigh is slightly larger than the  right, less than a centimeter.  No varicosities, ulcerations, cellulitis  or other infection noted.  Feet are well-perfused.   Today I reviewed all of the medical records supplied by Dr. Ludwig Clarks and  also ordered a lower extremity venous duplex exam, which I reviewed and  interpreted.  She has no evidence of DVT in either lower extremity.  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  The great saphenous vein  and the  saphenofemoral junctions are competent, as are the lesser saphenous  veins.   I do not think the remote right great saphenous vein thrombophlebitis  has anything to do with her current symptomatologies, and the left leg  is really the worst leg of the two.  I see nothing in her venous study  to account for her edema.   RECOMMENDATIONS:  1. Elevate the foot of the bed so that her legs are elevated all      night.  2. We fitted her for new stockings, possibly some short-leg stockings      with enclosed toes to see if she does better with these.  3. Continue diuretics.  4. May need a further cardiac evaluation by Dr. Elease Hashimoto if this has not      already been done.   Nothing further to recommend to this nice lady.     Quita Skye Hart Rochester, M.D.  Electronically  Signed   JDL/MEDQ  D:  09/30/2010  T:  10/01/2010  Job:  [id number]   cc:   Dr. Ludwig Clarks

## 2011-03-10 NOTE — Group Therapy Note (Signed)
CONSULTATION REQUESTED BY:  Pollyann Savoy, M.D.   CHIEF COMPLAINT:  Pain in shoulders, right elbow, right greater than  left knee, bilateral lower extremities, both bases of thumbs.   HISTORY:  [age]-year-old female referred for evaluation of her  fibromyalgia, who has an extensive past medical history dating back to  1984 when she had onset of  lower extremity weakness and neurogenic  bladder.  She had extensive workups including myelogram, CSF  examination, seen by neurology.  Ended up at [facility], where she was diagnosed with leg quadricep tendon rupture.  Her  bladder incontinence was felt to be more of a benign situation rather  than neurogenic bladder.  She had additional neurologic consultations at  [facility] with Dr. __________ .  She had a diagnosis with primary  lateral sclerosis, but then this diagnosis was dropped after further  evaluation.   Concomitantly she had problems with knee pain dating back to 1984 as  well.  Has undergone multiple arthroscopic procedures.  She has right  medial meniscal tear with degeneration as well as pseudogout diagnosed.  She has had multiple joint injections as well as Synvisc injections to  the right knee.  In addition, she has had a history of bilateral rotator  cuff tendonitis and degeneration.  Has had subacromial decompression for  heracromion.  In addition, she has had EC joint degeneration noted on  the right during MRI.   She has been through physical therapy in regards to her shoulders.   Because of fatigue and more diffuse pain, she has undergone  rheumatologic evaluation by Dr. Corliss Skains.  No sign of rheumatoid  arthritis or lupus.  Has been diagnosed with Raynaud's, pseudogout,  fibromyalgia and osteoporosis.  She has had some problem with  trochanteric bursitis as well.  She has had hip x-rays a couple of years  ago, which showed no evidence of ROA.  She has tolerated Lodine in the  past in  terms of her pseudogout.  She has been more recently started on  Lyrica for her fibromyalgia, i.e. July of 2008.  In terms of narcotic  analgesics, she has used them very sparingly.  She has intolerance to  codeine as well as Darvon, Darvocet.  Has had Mepergan and has a  prescription from 2007, and she still has several left.  She only takes  one tablet a couple times a month at most.   Her average pain is rated as 4/10 and currently 7.  Interferes with  activity at a moderate level.  Pain improves with rest, medication and  injections.  Relief from medications is fair.  She drives, she climbs  steps.   From a functional standpoint, she has some difficulties with certain  household duties and shopping.  She was on disability until year 2000.  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   REVIEW OF SYSTEMS:  Review of systems continued, has some bladder  urgency diagnosed with interstitial cystitis.  She has problem walking,  dizziness, fatigue, limb swelling in lower extremities.   OTHER PAST MEDICAL HISTORY:  She did have paroxysmal ventricular  tachycardia.  She had a positive stress test.  She had vasospasm.  This  was in 2006.  She continues to follow with cardiology and has not had  any MIs or any revascularization procedures.   PAST SURGICAL HISTORY:  In addition to above, she has had breast biopsy,  gallbladder surgery, bladder surgery, hysterectomy, D&C, tonsillectomy,  appendectomy.   SOCIAL HISTORY:  Lives with her husband.  Nonsmoker, nondrinker.   FAMILY HISTORY:  High blood pressure, cancer and lupus.   EXAMINATION:  Blood pressure 123/55, pulse 67, respiratory rate 18, O2  saturation 98% room air.  Palpation at fibromyalgia tender points:  Bilateral upper trap,  bilateral low back, bilateral hip area and right elbow for a total of  seven.   Her neck range of  motion is full.  She has negative Spurling's maneuver.  Shoulder:  Deltoid strength is 4/5 limited by pain.  She has positive  impingement sign bilaterally.  She has no evidence of atrophy in the  upper or lower extremities with exception of mild VMO atrophy.  She has  tenderness to palpation over the medial epicondyle.  She has pain with  resisted flexion greater than with resisted extension.  She has normal  sensation bilateral upper and lower extremities.  She has normal tone in  upper and lower extremities, normal joint stability with exception of  the shoulder, where she has some guarding difficult to assess.   Lumbar range of motion is normal.  She has a gait which is slightly wide  based, but no evidence of toe dragging or knee instability.  She has 4-  /5 bilateral quadriceps, extensor lag.  She has normal EHL as well as TA  as well as plantar flexors in the lower extremities.  Hip range of  motion is normal, no tenderness over the greater trochanters.   IMPRESSION:  1. Chronic pain of several etiologies.  Most acute today is medial      epicondylitis.  We will ask her to restart her anti-inflammatory.  2. Widespread pain.  Certainly may have fibromyalgia, only has seven      tender points today.  She does have additional symptoms of fatigue      as well as a diagnosis of interstitial cystitis.  No irritable      bowel syndrome.  This may actually represent more of a myofascial      pain syndrome, which is reasonable.  Right knee pain, history of      meniscal degeneration and pseudogout affecting the knee.  3. Lower extremity swelling.  Has had normal Dopplers September of      2008  This is likely secondary to her Lyrica.   PLAN:  1. I think she can benefit from aquatic exercises.  Have given her      information on the fibromyalgia aquatics class at the [facility].  2. Lidoderm patch over the knee epicondyle as well as anti-      inflammatories.  3. In terms of narcotic  analgesics, she does have plenty left.  I will      check a urine drug screen, which I anticipate will look okay, and I      can take over her Mepergan prescription.  She only takes one or two      of these a month.  4. Consider acupuncture treatment given her multiple etiologies of      pain, may be helpful in this regard as well as with her      interstitial cystitis, pelvic pain symptomatology.   Thank you for this interesting consultation.  I will keep you apprised  of her progress.      Erick Colace, M.D.  Electronically Signed  AEK/MedQ  D:  12/08/2007 17:21:17  T:  12/09/2007 17:28:12  Job #:  [id number]   cc:   Jonny Ruiz L. Rendall, M.D.  Fax: [phone]

## 2011-03-10 NOTE — Procedures (Signed)
NAME:  TERENA, BOHAN NO.:  [id number]   MEDICAL RECORD NO.:  [id number]          PATIENT TYPE:  OUT   LOCATION:  SLEEP CENTER                 FACILITY[facility]  [facility]   PHYSICIAN:  Clinton D. Maple Hudson, MD, FCCP, FACPDATE OF BIRTH:  1951-07-01   DATE OF STUDY:                            NOCTURNAL POLYSOMNOGRAM   REFERRING PHYSICIAN:  Pollyann Savoy, M.D.   INDICATIONS FOR STUDY:  Hypersomnia with sleep apnea.   EPWORTH SLEEPINESS SCORE:  10/24   BMI 24.2.  Weight 150 pounds, height 66 inches.  Neck 11.5 inches.  The  diagnostic NPSG on Feb 27, 2009 had recorded an AHI of 13.9 per hour, RDI  25.9 per hour.  CPAP titration is now requested.   SLEEP ARCHITECTURE:  Total sleep time 269 minutes with sleep efficiency  67%.  Stage I was 6.9%, stage II 79%, stage III absent, REM 14.1% of  total sleep time.  Sleep latency 59 minutes, REM latency 113 minutes,  awake after sleep onset 74 minutes, arousal index 6.7   Bedtime medication included Lyrica, elm iron, pindolol, Flexeril,  magnesium, calcium, vitamin D.   RESPIRATORY DATA:  CPAP titration protocol.  CPAP was titrated to 14  CWP, AHI 0 per hour.  Technician worked with the patient on mask size  and fit, switching to a Respironics fit life mask size large but  indicating that she would probably need some effort on the part of home  care company to get proper mask size and fit.  Heated humidifier.   OXYGEN DATA:  Snoring was prevented by CPAP with mean oxygen saturation,  on CPAP held at 95.2% on room air.   CARDIAC DATA:  Normal sinus rhythm.   MOVEMENT/PARASOMNIA:  No significant movement disturbance.  No bathroom  trips.   IMPRESSION/RECOMMENDATIONS:  1. Successful CPAP titration to 14 CWP, AHI 0 per hour.  The      technician settled on a size large Respironics fit life mask with      heated humidifier but indicated that home care would likely have to      work some to achieve best      fit.  2. Baseline  diagnostic and PSG on Feb 27, 2009 had recorded an AHI of      13.9 per hour, RDI of 25.9 per hour.      Clinton D. Maple Hudson, MD, [facility], FACP  Diplomate, Biomedical engineer of Sleep Medicine  Electronically Signed     CDY/MEDQ  D:  03/31/2009 20:24:59  T:  04/01/2009 08:10:15  Job:  [id number]

## 2011-03-10 NOTE — Op Note (Signed)
NAME:  Mia [name], NODAL                  ACCOUNT NO.:  [id number]   MEDICAL RECORD NO.:  [id number]          PATIENT TYPE:  AMB   LOCATION:  [facility]                         [facility]AC[facility]  [facility]   PHYSICIAN:  Sigmund I. Patsi Sears, M.D.DATE OF BIRTH:  06-14-1951   DATE OF PROCEDURE:  05/26/2007  DATE OF DISCHARGE:                               OPERATIVE REPORT   PREOPERATIVE DIAGNOSIS:  Urinary frequency and urgency, rule out  interstitial cystitis.   POSTOPERATIVE DIAGNOSIS:  Urinary frequency and urgency, rule out  interstitial cystitis.   OPERATION:  Cystourethroscopy, hydrodistention of the bladder (850 mL),  biopsy of the bladder, cauterization of biopsy sites, insertion of  Pyridium and Marcaine in the bladder, injection of Marcaine and Kenalog  in the subtrigonal space.   SURGEON:  Sigmund I. Patsi Sears, M.D.   ANESTHESIA:  General LMA.   PREPARATION:  After appropriate preanesthesia, the patient is brought to  the operating room and placed on the operating room table in the dorsal  supine position where general LMA anesthesia was induced.  She was then  replaced in the dorsal lithotomy position where the pubis was prepped  with Betadine solution and draped in the usual fashion.   REVIEW OF HISTORY:  The patient has a history of recurrent cystitis,  dysuria, for evaluation today for possible interstitial cystitis.   DESCRIPTION OF PROCEDURE:  Cystourethroscopy was accomplished and showed  a normal appearing urethral meatus, normal appearing female urethra.  The bladder base was normal with no evidence of bladder stone, tumor, or  bladder diverticular formation.  Clear efflux was seen from both  orifices.  Bimanual palpation was accomplished and shows no evidence of  pelvic mass.  Cold cup bladder biopsies were obtained and the biopsy  sites were cauterized.  Pyridium and Marcaine solution was inserted in  the bladder and Marcaine and Kenalog was injected in the subtrigonal  space.   The bladder holds 850 mL.      Sigmund I. Patsi Sears, M.D.  Electronically Signed     SIT/MEDQ  D:  05/26/2007  T:  05/26/2007  Job:  [id number]

## 2011-03-11 ENCOUNTER — Encounter: Payer: Self-pay | Admitting: Cardiovascular Disease

## 2011-03-11 ENCOUNTER — Ambulatory Visit (INDEPENDENT_AMBULATORY_CARE_PROVIDER_SITE_OTHER): Payer: BC Managed Care – PPO | Admitting: Cardiovascular Disease

## 2011-03-11 DIAGNOSIS — I498 Other specified cardiac arrhythmias: Secondary | ICD-10-CM

## 2011-03-11 DIAGNOSIS — I471 Supraventricular tachycardia: Secondary | ICD-10-CM

## 2011-03-11 DIAGNOSIS — I10 Essential (primary) hypertension: Secondary | ICD-10-CM | POA: Insufficient documentation

## 2011-03-11 NOTE — Assessment & Plan Note (Signed)
Her blood pressure remains well-controlled. I've asked her to watch her salt intake.

## 2011-03-11 NOTE — Assessment & Plan Note (Signed)
She has not had any further episodes of supraventricular tachycardia. She is tolerating the diltiazem quite well. We'll continue with her current dose. I'll see her again in one year.

## 2011-03-11 NOTE — Progress Notes (Signed)
Mia [name] Date of Birth  03/16/1951 [facility] [address]     [address] [place], [place]  [address] [phone]  Fax  [phone]  History of Present Illness:  Pt has a history of SVT, sleep apnea, normal coronaries, Raynaud's phenomen.  Doing very will on the diltiazem Exercising some.  Limited by ruptured disc in her neck.  Current Outpatient Prescriptions  Medication Sig Dispense Refill  . calcium carbonate (OS-CAL) 600 MG TABS Take 600 mg by mouth 2 (two) times daily with a meal.        . colchicine 0.6 MG tablet Take 0.6 mg by mouth as needed.       . cyclobenzaprine (FLEXERIL) 10 MG tablet Take 10 mg by mouth at bedtime.        . digoxin (LANOXIN) 0.25 MG tablet Take 250 mcg by mouth daily.        Marland Kitchen diltiazem (CARDIZEM CD) 180 MG 24 hr capsule Take 180 mg by mouth daily.        . DULoxetine (CYMBALTA) 30 MG capsule Take 30 mg by mouth daily.        Marland Kitchen estradiol (ESTRACE) 2 MG tablet Take 2 mg by mouth daily.        . fexofenadine (ALLEGRA) 180 MG tablet Take 180 mg by mouth daily.       . fluticasone (FLONASE) 50 MCG/ACT nasal spray 2 sprays by Nasal route daily.        . furosemide (LASIX) 20 MG tablet Take 20 mg by mouth daily.        . methenamine (HIPREX) 1 G tablet Take 1 g by mouth as needed.        Marland Kitchen omeprazole-sodium bicarbonate (ZEGERID) 40-1100 MG per capsule Take 1 capsule by mouth daily before breakfast.        . pentosan polysulfate (ELMIRON) 100 MG capsule Take 100 mg by mouth 3 (three) times daily before meals.        . pindolol (VISKEN) 5 MG tablet Take 5 mg by mouth daily.        . vitamin D, CHOLECALCIFEROL, 400 UNITS tablet Take 400 Units by mouth 2 (two) times daily.        Marland Kitchen DISCONTD: aspirin 81 MG tablet Take 81 mg by mouth daily.        Marland Kitchen DISCONTD: Folic Acid-Vit B6-Vit B12 (FOLGARD PO) Take by mouth daily.        Marland Kitchen DISCONTD: Ketorolac Tromethamine (ACUVAIL) 0.45 % SOLN Apply to eye 2 (two) times daily.         Marland Kitchen DISCONTD: Magnesium 250 MG TABS Take by mouth daily.        Marland Kitchen DISCONTD: potassium gluconate 595 MG TABS Take 595 mg by mouth daily.        Marland Kitchen DISCONTD: pregabalin (LYRICA) 150 MG capsule Take 150 mg by mouth 2 (two) times daily.        Marland Kitchen DISCONTD: trimethoprim (TRIMPEX) 100 MG tablet Take 100 mg by mouth daily.           Allergies  Allergen Reactions  . Codeine   . Nitrofurantoin   . Verapamil     REACTION: intolerance    Past Medical History  Diagnosis Date  . SVT (supraventricular tachycardia)   . Sleep apnea   . Raynaud phenomenon   . Edema     Past Surgical History  Procedure Date  . Cholecystectomy   . Appendectomy   .  Tonsillectomy   . Hysterectomy - unknown type   . Knee surgery     x6  . Oophorectomy   . Dilation and curettage of uterus   . Breast biopsy   . Shoulder surgery     History  Smoking status  . Never Smoker   Smokeless tobacco  . Not on file    History  Alcohol Use No    Family History  Problem Relation Age of Onset  . Heart attack    . Stroke    . Diabetes      Reviw of Systems:  Reviewed in the HPI.  All other systems are negative.  Physical Exam: BP 118/80  Pulse 62  Ht 5\' 6"  (1.676 m)  Wt 157 lb 9.6 oz (71.487 kg)  BMI 25.44 kg/m2 The patient is alert and oriented x 3.  The mood and affect are normal.  The skin is warm and dry.  Color is normal.  The HEENT exam reveals that the sclera are nonicteric.  The mucous membranes are moist.  The carotids are 2+ without bruits.  There is no thyromegaly.  There is no JVD.  The lungs are clear.  The chest wall is non tender.  The heart exam reveals a regular rate with a normal S1 and S2.  There are no murmurs, gallops, or rubs.  The PMI is not displaced.   Abdominal exam reveals good bowel sounds.  There is no guarding or rebound.  There is no hepatosplenomegaly or tenderness.  There are no masses.  Exam of the legs reveal no clubbing, cyanosis, or edema.  The legs are without rashes.   The distal pulses are intact.  Cranial nerves II - XII are intact.  Motor and sensory functions are intact.  The gait is normal.  ECG:  Assessment / Plan:

## 2011-03-13 MED ORDER — OMEPRAZOLE 20 MG TAB, DELAYED RELEASE
20 mg | ORAL_TABLET | Freq: Every day | ORAL | Status: DC
Start: 2011-03-13 — End: 2012-12-16

## 2011-03-13 MED ORDER — ATORVASTATIN 20 MG TAB
20 mg | ORAL_TABLET | Freq: Every day | ORAL | Status: DC
Start: 2011-03-13 — End: 2012-03-22

## 2011-03-13 MED ORDER — ERGOCALCIFEROL (VITAMIN D2) 50,000 UNIT CAP
1250 mcg (50,000 unit) | ORAL_CAPSULE | ORAL | Status: DC
Start: 2011-03-13 — End: 2012-05-30

## 2011-03-13 NOTE — Cardiovascular Report (Signed)
NAME:  MIGNONNE, AFONSO NO.:  [id number]   MEDICAL RECORD NO.:  [id number]          PATIENT TYPE:  OIB   LOCATION:  2899                         FACILITY:  [facility]   PHYSICIAN:  Elmore Guise., M.D.DATE OF BIRTH:  [birth year]   DATE OF PROCEDURE:  02/11/2005  DATE OF DISCHARGE:                              CARDIAC CATHETERIZATION   INDICATIONS FOR PROCEDURE:  Increasing exertional chest pain and shortness  of breath as well as abnormal stress test.   PROCEDURE:  Left heart catheterization, coronary angiogram, LV angiogram,  right heart catheterization, limited right femoral artery angiogram.   DESCRIPTION OF PROCEDURE:  The patient was brought to the cardiac  catheterization lab after appropriate informed consent.  She was prepped and  draped in the usual sterile fashion.  A 6 French sheath was placed in the  right femoral artery without difficulty.  A 7 French sheath was placed in  the right femoral vein after multiple attempts.  Fluoroscopy guidance was  used for venous access secondary to difficulty.  Approximately 20 mL of 1%  lidocaine was used for local anesthesia.  Right heart catheterization was  performed followed by left heart catheterization, coronary angiogram, LV  angiogram, limited right femoral angiogram.  The patient tolerated the  procedure well.  No complications.   FINDINGS:  Right heart catheterization:    RA pressure: 8 mmHg    RV pressure: 25/7 mmHg    PA pressure: 20/9 mmHg    PWCP: 9 mmHg    CO: 4.6 liters per minute   LEFT HEART CATHETERIZATION RESULTS:  1.  Left main:  No significant disease.  2.  LAD:  Mild luminal irregularities.  3.  First and second diagonal:  Moderate size, mild luminal irregularities.  4.  Left circumflex:  Nondominant, mild luminal irregularities.  5.  RCA:  Dominant with mild luminal irregularities.  6.  LV:  EF is 55 to 60%, no wall motion abnormalities.  LV EDP was 9 mmHg.  7.  Right femoral artery:   Appropriate arteriotomy site with no significant      disease.  No dissection and no bleeding.  There was noted spasm in her      RCA during contrast injection.   IMPRESSION:  1.  Nonobstructive coronary arteries as described.  2.  Significant arterial vasospasm which was symptomatic and resolved      spontaneously.  3.  Preserved LV systolic function with an EF of 55 to 60% and no wall      motion abnormalities.  4.  Normal pulmonary artery pressures and cardiac output.   PLAN:  Aggressive medical management with Verapamil for her vasospasm as  well as continued aspirin.  Because of her continued dyspnea, will check  PFT's in the morning.      TWK/MEDQ  D:  02/11/2005  T:  02/11/2005  Job:  [id number]

## 2011-03-13 NOTE — Discharge Summary (Signed)
NAME:  Mia [name], Mia [name] NO.:  [id number]   MEDICAL RECORD NO.:  [id number]          PATIENT TYPE:  OIB   LOCATION:  4730                         FACILITY:  [facility]   PHYSICIAN:  Elmore Guise., M.D.DATE OF BIRTH:  06-Apr-1951   DATE OF ADMISSION:  02/11/2005  DATE OF DISCHARGE:  02/13/2005                                 DISCHARGE SUMMARY   DISCHARGE DIAGNOSES:  1.  Coronary artery vasospasm.  2.  Paroxysmal supraventricular tachycardia.  3.  Urinary tract infection.  4.  History of migraine headaches.   HISTORY OF PRESENT ILLNESS:  The patient is a very pleasant 60 year old  female who presented to the office after increasing dyspnea on exertion and  a atypical type of chest pain.  She underwent stress echocardiogram and walk  via Bruce protocol for 6 minutes; EKG and echo were positive for possible  ischemia.  She was admitted for cardiac catheterization and further workup.   HOSPITAL COURSE:  The patient underwent cardiac catheterization on February 11, 2005, showing no significant stenosis; however, it was noted the patient had  vasospasm on engagement of her RCA.  After her catheterization, the patient  had episodes of paroxysmal supraventricular tachycardia; she was started on  verapamil 120 mg p.o. daily with significant improvement in her tachycardia.  She also underwent pulmonary function testing with and without  bronchodilators which was normal.  Post catheterization, she did have pain  in her right femoral area.  She had an ultrasound of her right femoral area  as well as right lower quadrant showing no hematoma, no retroperitoneal  bleed and no A-V fistula.  Because of her complaints of lower abdominal and  bladder cramping, as well as dysuria, a UA was sent showing a UTI.  She has  now been up and ambulatory, tolerating p.o. diet well and essentially no  further abdominal pain.  She will be discharged today to continue the  following  medications:   DISCHARGE MEDICATIONS:  1.  Verapamil long-acting 120 mg daily.  2.  Verapamil short-acting 40 mg on a p.r.n. basis.  3.  Cipro 250 mg p.o. b.i.d. for the next 5 days.   All other medications will be as before and they include:  1.  Topamax 100 mg daily.  2.  Estradiol 1 mg daily.  3.  Trimethoprim 100 mg daily.  4.  Mobic 15 mg daily.  5.  Allegra and 180 mg daily.  6.  Colchicine 0.6 mg daily.  7.  Aciphex daily.  8.  Imitrex on p.r.n. basis.   ACTIVITY:  I did ask her to refrain from any strenuous activity or heavy  lifting for the next 48 hours.  She may return to her normal activities on  February 16, 2005 and increase them as tolerated.   DIET:  Her diet should be low-salt, low-fat.   FOLLOWUP:  She will return for hospital followup in 2 weeks.  I did ask her  to notify us should she have any further problems.  All her questions were  answered prior to discharge.  TWK/MEDQ  D:  02/13/2005  T:  02/14/2005  Job:  [id number]

## 2011-03-13 NOTE — Patient Instructions (Signed)
MyChart Activation    Thank you for requesting access to MyChart. Please follow the instructions below to securely access and download your online medical record. MyChart allows you to send messages to your doctor, view your test results, renew your prescriptions, schedule appointments, and more.    How Do I Sign Up?    1. In your internet browser, go to [email or web address]  2. Click on the First Time User? Click Here link in the Sign In box. You will be redirect to the New Member Sign Up page.  3. Enter your MyChart Access Code exactly as it appears below. You will not need to use this code after you???ve completed the sign-up process. If you do not sign up before the expiration date, you must request a new code.    MyChart Access Code: [email or web address]  Expires: 05/10/2011  2:35 PM (This is the date your MyChart access code will expire)    4. Enter the last four digits of your Social Security Number (xxxx) and Date of Birth (mm/dd/yyyy) as indicated and click Submit. You will be taken to the next sign-up page.  5. Create a MyChart ID. This will be your MyChart login ID and cannot be changed, so think of one that is secure and easy to remember.  6. Create a MyChart password. You can change your password at any time.  7. Enter your Password Reset Question and Answer. This can be used at a later time if you forget your password.   8. Enter your e-mail address. You will receive e-mail notification when new information is available in MyChart.  9. Click Sign Up. You can now view and download portions of your medical record.  10. Click the Download Summary menu link to download a portable copy of your medical information.    Additional Information    Remember, MyChart is NOT to be used for urgent needs. For medical emergencies, dial 911.

## 2011-03-13 NOTE — Progress Notes (Signed)
60 year old white female who presents for followup evaluation.    We've not seen her in about a year. However, she seems to be doing well.     Denies any cardiovascular complaints. No GU complaints.    She has been having problems with what sounds like irritable bowel. Shehas a lot of gassiness, gurgling and cramping. Stools have been more loose. She does admit she consumes lots of dairy products and she had not been using her fiber.    She's not been exercising, not watching her diet as closely. Remains on Lipitor 10    Patient Active Problem List   Diagnoses Code   ??? Hyperlipidemia 272.4   ??? Impaired fasting glucose 790.21   ??? Vitamin D deficiency 268.9   ??? DJD (degenerative joint disease) 715.90   ??? Anxiety 300.00   ??? Hyperlipidemia 272.4   ??? Syncope 780.2     Past Medical History   Diagnosis Date   ??? 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 Hypercholesterolemia    ??? Multiple lung nodules      no change 2006, 2007, 2008   ??? Anxiety state, unspecified    ??? Palpitations 2008     neg thallium 2008, nl holter 2008, echo nl lv/ef 65%/tr mr/dd/nl pasp   ??? Hyperlipidemia    ??? Prediabetes    ??? Vitamin D deficiency    ??? Arthritis      Dr. Paulina Fusi, Dr. Parks Ranger   ??? DJD (degenerative joint disease)      Past Surgical History   Procedure Date   ??? Hx hemorrhoidectomy      Dr. Derrell Lolling 2007   ??? Endoscopy, colon, diagnostic 2007     negative Dr Derrell Lolling   ??? Echo 2d adult 12/04     normal with EF 70%   ??? Stress test thallium study 2005     negative   ??? Vas carotid duplex bilateral 2004     negative   ??? [place] abd comp 9/05     negative     History     Social History   ??? Marital Status: Married     Spouse Name: N/A     Number of Children: 2   ??? 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 Smoking status: Never Smoker    ??? Smokeless tobacco: Not on file   ??? Alcohol Use: No   ??? Drug Use: No   ??? Sexually Active:      Other Topics Concern   ??? Not on file      Social History Narrative   ??? No narrative on file     Family History   Problem Relation Age of Onset   ??? Diabetes Mother    ??? Elevated Lipids Father    ??? Stroke Paternal Grandmother    ??? Heart Disease Paternal Grandfather      Current Outpatient Prescriptions   Medication Sig   ??? ergocalciferol (VITAMIN D2) 50,000 unit capsule Take 1 Cap by mouth every seven (7) days.   ??? Omeprazole delayed release (PRILOSEC D/R) 20 mg tablet Take 1 Tab by mouth daily.   ??? atorvastatin (LIPITOR) 20 mg tablet Take 1 Tab by mouth daily.   ??? AZASITE 1 % ophthalmic solution    ??? citalopram (CELEXA) 10 mg tablet Take 1 Tab by mouth daily.   ???  betamethasone valerate (VALISONE) 0.1 % topical cream Apply  to affected area two (2) times a day.   ??? triamterene-hydrochlorothiazide (DYAZIDE) 37.5-25 mg per capsule Take  by mouth daily.     Allergies   Allergen Reactions   ??? Fish Oil Nausea Only   ??? Relafen (Nabumetone) Nausea Only     REVIEW OF SYSTEMS:   Ophtho ??? no vision change or eye pain  Oral ??? no mouth pain, tongue or tooth problems  Ears ??? no hearing loss, ear pain, fullness, no swallowing problems  Cardiac ??? no CP, PND, orthopnea, edema, palpitations or syncope  Chest ??? no breast masses  Resp ??? no wheezing, chronic coughing, dyspnea  Urinary ??? no dysuria, hematuria, flank pain, urgency, frequency  Ortho ??? no swelling, dec ROM, myalgias  Psych ??? denies any anxiety or depression symptoms, no hallucinations or violent ideation  Endo - no polyuria, polydipsia, nocturia, hot flashes    PHYSICAL EXAM:  Visit Vitals   Item Reading   ??? BP 127/77   ??? Pulse 88   ??? Temp(Src) 98.2 ??F (36.8 ??C) (Oral)   ??? Ht 5\' 4"  (1.626 m)   ??? Wt 159 lb (72.122 kg)   ??? BMI 27.29 kg/m2     Affect is appropriate.  Mood stable  No apparent distress  HEENT --Anicteric sclerae, tympanic membranes normal,  ear canals normal.  Sinuses were nontender, turbinates normal, hearing normal.  Oropharynx without   erythema, normal tongue, oral mucosa and tonsils.  No thyromegaly, JVD, or bruits.    Lungs --Clear to auscultation and percussion, normal percussion.  Heart --Regular rate and rhythm, no murmurs, rubs, gallops, or clicks.  Chest wall --Nontender to palpation.  PMI normal.  Abdomen -- Soft and nontender, no hepatosplenomegaly or masses.  Extremities -- Without cyanosis, clubbing, edema. 2+ pulses equally and bilaterally.    Results for orders placed in visit on 03/02/11   METABOLIC PANEL, COMPREHENSIVE       Component Value Range    Glucose 113 (*) 70 - 100 (mg/dL)    BUN 13  7 - 25 (mg/dL)    Creatinine 1.91  4.78 - 1.40 (mg/dL)    GFR est non-AA 94  >59 (mL/min/1.73)    eGFR If Africn Am 108  >59 (mL/min/1.73)    Sodium 143  135 - 145 (mEq/L)    Potassium 4.1  3.6 - 5.5 (mEq/L)    Chloride 107  94 - 112 (mEq/L)    CO2 27.8  25.0 - 34.5 (mEq/L)    Calcium 8.9  8.1 - 10.4 (mg/dL)    Protein, total 6.6  6.3 - 8.5 (g/dL)    Albumin 3.9  3.5 - 5.3 (g/dL)    GLOBULIN, TOTAL 2.7  2.0 - 4.8 (g/dL)    A-G Ratio 1.4  0.6 - 2.2     Bilirubin, total 0.5  0.2 - 1.3 (mg/dL)    Alk. phosphatase 77  42 - 98 (U/L)    AST 18  9 - 44 (U/L)    ALT 16  10 - 40 (U/L)   HEMOGLOBIN A1C       Component Value Range    Hemoglobin A1C 6.2 (*) 4.8 - 5.6 (%)   NMR LIPOPROFILE       Component Value Range    LDL-P 1898 (*) <1000 (nmol/L)    LDL-C 102 (*) <100 (mg/dL)    HDL-C 43  >=29 (mg/dL)    Triglycerides 562 (*) <150 (mg/dL)  Cholesterol, Total 177  <200 (mg/dL)    HDL-P (Total) 16.1 (*) >= 09.6 (umol/L)    Small LDL-P 1130 (*) <= 527 (nmol/L)    LDL size 20.5 (*) > 20.5 (nm)    LP-IR SCORE 70 (*) <= 45    VITAMIN D, 25 HYDROXY       Component Value Range    VITAMIN D, 25-HYDROXY 18.4 (*) 30.0 - 100.0 (ng/mL)     Assessment and plan:  1. Prediabetes. Lifestyle and dietary modification reiterated. Weight loss would be ideal   2. Hyperlipidemia. Increase Lipitor to 20, she will increase exercise program and we gave her the Mediterranean diet also.  3. Hypovitaminosis D. Restart supplementation indefinitely.  4. Irritable bowel. Possible lactose intolerance. She will increase fiber, try lactaid or lactose avoidance. I asked her to stay on her ppi for now       RTC 6 months

## 2011-03-25 ENCOUNTER — Other Ambulatory Visit: Payer: Self-pay | Admitting: *Deleted

## 2011-03-25 DIAGNOSIS — I10 Essential (primary) hypertension: Secondary | ICD-10-CM

## 2011-03-25 MED ORDER — PINDOLOL 5 MG PO TABS: 5.0000 mg | ORAL_TABLET | Freq: Every day | ORAL | Status: DC

## 2011-03-25 NOTE — Telephone Encounter (Signed)
Fax received from pharmacy. Refill completed. Jodette Ingri Diemer RN  

## 2011-05-07 ENCOUNTER — Ambulatory Visit (HOSPITAL_COMMUNITY)
Admission: RE | Admit: 2011-05-07 | Discharge: 2011-05-07 | Disposition: A | Discharge status: Home / Self Care | Payer: BC Managed Care – PPO | Source: Ambulatory Visit | Attending: Orthopedic Surgery | Admitting: Orthopedic Surgery

## 2011-05-07 ENCOUNTER — Other Ambulatory Visit (HOSPITAL_COMMUNITY): Payer: Self-pay | Admitting: Orthopedic Surgery

## 2011-05-07 DIAGNOSIS — M79609 Pain in unspecified limb: Secondary | ICD-10-CM | POA: Insufficient documentation

## 2011-05-07 DIAGNOSIS — M539 Dorsopathy, unspecified: Secondary | ICD-10-CM | POA: Insufficient documentation

## 2011-05-07 DIAGNOSIS — M542 Cervicalgia: Secondary | ICD-10-CM

## 2011-05-07 DIAGNOSIS — R1110 Vomiting, unspecified: Secondary | ICD-10-CM | POA: Insufficient documentation

## 2011-05-07 DIAGNOSIS — M5380 Other specified dorsopathies, site unspecified: Secondary | ICD-10-CM | POA: Insufficient documentation

## 2011-05-07 DIAGNOSIS — M129 Arthropathy, unspecified: Secondary | ICD-10-CM | POA: Insufficient documentation

## 2011-06-22 ENCOUNTER — Ambulatory Visit (INDEPENDENT_AMBULATORY_CARE_PROVIDER_SITE_OTHER): Payer: BC Managed Care – PPO | Admitting: Surgery

## 2011-06-22 ENCOUNTER — Encounter (INDEPENDENT_AMBULATORY_CARE_PROVIDER_SITE_OTHER): Payer: Self-pay | Admitting: Surgery

## 2011-06-22 VITALS — BP 116/70 | HR 56 | Temp 98.3°F | Ht 66.0 in | Wt 148.4 lb

## 2011-06-22 DIAGNOSIS — K645 Perianal venous thrombosis: Secondary | ICD-10-CM

## 2011-06-22 NOTE — Progress Notes (Signed)
Chief Complaint  Patient presents with  . Other    urgent- thromb hems    HISTORY: Patient is a 60 year old female who presents with thrombosed external hemorrhoids. These have been present for approximately 5 days and becoming increasingly symptomatic. Patient denies any bleeding. She has had previous rectal procedures for enterocele and rectocele. She has not had any prior hemorrhoid surgery.    Past Medical History  Diagnosis Date  . SVT (supraventricular tachycardia)   . Sleep apnea   . Raynaud phenomenon   . Edema   . Anemia   . Arthritis   . Blood transfusion   . Diabetes mellitus   . GERD (gastroesophageal reflux disease)     gastritis  . Hypertension   . Neuromuscular disorder     sclerosis  . Neuromuscular disorder     fibromyalgia  . Pseudogout   . Thyroid disease     hypothyroidism  . Hemorrhoids   . Rectal bleeding      Current Outpatient Prescriptions  Medication Sig Dispense Refill  . calcium carbonate (OS-CAL) 600 MG TABS Take 600 mg by mouth 2 (two) times daily with a meal.        . cyclobenzaprine (FLEXERIL) 10 MG tablet Take 10 mg by mouth at bedtime.        Marland Kitchen diltiazem (CARDIZEM CD) 180 MG 24 hr capsule Take 180 mg by mouth daily.        . Donepezil HCl (ARICEPT PO) Take by mouth daily.        Marland Kitchen estradiol (ESTRACE) 2 MG tablet Take 2 mg by mouth daily.        . fexofenadine (ALLEGRA) 180 MG tablet Take 180 mg by mouth daily.       . fluticasone (FLONASE) 50 MCG/ACT nasal spray 2 sprays by Nasal route daily.        . furosemide (LASIX) 20 MG tablet Take 20 mg by mouth daily.        . Meth-Hyo-M Bl-Na Phos-Ph Sal (URIBEL PO) Take by mouth as needed.        Marland Kitchen NITROGLYCERIN PO Take by mouth as needed.        . pantoprazole (PROTONIX) 40 MG tablet Take 40 mg by mouth daily.        . pentosan polysulfate (ELMIRON) 100 MG capsule Take 100 mg by mouth 3 (three) times daily before meals.        . pindolol (VISKEN) 5 MG tablet Take 1 tablet (5 mg total) by  mouth daily.  30 tablet  11  . vitamin D, CHOLECALCIFEROL, 400 UNITS tablet Take 400 Units by mouth 2 (two) times daily.           Allergies  Allergen Reactions  . Codeine   . Nitrofurantoin   . Verapamil     REACTION: intolerance     Family History  Problem Relation Age of Onset  . Heart attack    . Stroke    . Diabetes    . Cancer Maternal Grandmother     breast  . Cancer Maternal Grandfather     pancreatic     History   Social History  . Marital Status: Married    Spouse Name: N/A    Number of Children: N/A  . Years of Education: N/A   Social History Main Topics  . Smoking status: Never Smoker   . Smokeless tobacco: None  . Alcohol Use: No  . Drug Use: No  . Sexually Active: None  Other Topics Concern  . None   Social History Narrative  . None     REVIEW OF SYSTEMS - PERTINENT POSITIVES ONLY: No significant bleeding. Moderate pain. Increasing swelling.   EXAM: Filed Vitals:   06/22/11 1604  BP: 116/70  Pulse: 56  Temp: 98.3 F (36.8 C)    HEENT: normocephalic; pupils equal and reactive; sclerae clear; dentition good; mucous membranes moist NECK:  ; symmetric on extension; no palpable anterior or posterior cervical lymphadenopathy; no supraclavicular masses; no tenderness CHEST: clear to auscultation bilaterally without rales, rhonchi, or wheezes CARDIAC: regular rate and rhythm without significant murmur; peripheral pulses are full EXT:  non-tender without edema; no deformity NEURO: no gross focal deficits; no sign of tremor ANORECTAL: obvious thrombosed ext hem in anterior midline with ulceration; markedly tender; mild prolapse  PROCEDURE: Under aseptic conditions, anterior anoderm injected with local with epi.  Incision made with iris scissors; large volume thrombus and ectatic vein evacuated.  Dry dressing placed.   LABORATORY RESULTS: See E-Chart for most recent results   RADIOLOGY RESULTS: See E-Chart or I-Site for most recent  results   IMPRESSION: Thrombosed external hemorrhoid with ulceration   PLAN: Usual instructions for local wound care given. Patient will begin to soaks. She will wear a dry pad for the next 2-3 days. She will followup in our office as needed.   Velora Heckler, MD, FACS General & Endocrine Surgery [facility]      Visit Diagnoses: 1. Hemorrhoids, external, thrombosed     Primary Care Physician: Henri Medal, MD

## 2011-06-22 NOTE — Patient Instructions (Signed)
Tub soaks 3 times daily.  Dry gauze or pad as needed.

## 2011-07-22 ENCOUNTER — Encounter (HOSPITAL_BASED_OUTPATIENT_CLINIC_OR_DEPARTMENT_OTHER)
Admission: RE | Admit: 2011-07-22 | Discharge: 2011-07-22 | Disposition: A | Discharge status: Home / Self Care | Payer: BC Managed Care – PPO | Source: Ambulatory Visit | Attending: Orthopedic Surgery | Admitting: Orthopedic Surgery

## 2011-07-22 ENCOUNTER — Telehealth: Payer: Self-pay | Admitting: Physician Assistant

## 2011-07-22 LAB — BASIC METABOLIC PANEL
BUN: 10 mg/dL (ref 6–23)
CO2: 32 mEq/L (ref 19–32)
Calcium: 9.7 mg/dL (ref 8.4–10.5)
Chloride: 99 mEq/L (ref 96–112)
Creatinine, Ser: 0.82 mg/dL (ref 0.50–1.10)
GFR calc Af Amer: >60 mL/min (ref >60)
GFR calc non Af Amer: >60 mL/min (ref >60)
Glucose, Bld: 95 mg/dL (ref 70–99)
Potassium: 3.8 mEq/L (ref 3.5–5.1)
Sodium: 139 mEq/L (ref 135–145)

## 2011-07-22 NOTE — Telephone Encounter (Signed)
All Cardiac faxed to [facility] @ [phone]   07/22/11/km

## 2011-07-23 ENCOUNTER — Ambulatory Visit (HOSPITAL_BASED_OUTPATIENT_CLINIC_OR_DEPARTMENT_OTHER)
Admission: RE | Admit: 2011-07-23 | Discharge: 2011-07-23 | Disposition: A | Discharge status: Home / Self Care | Payer: BC Managed Care – PPO | Source: Ambulatory Visit | Attending: Orthopedic Surgery | Admitting: Orthopedic Surgery

## 2011-07-23 DIAGNOSIS — M67919 Unspecified disorder of synovium and tendon, unspecified shoulder: Secondary | ICD-10-CM | POA: Insufficient documentation

## 2011-07-23 DIAGNOSIS — Z01812 Encounter for preprocedural laboratory examination: Secondary | ICD-10-CM | POA: Insufficient documentation

## 2011-07-23 DIAGNOSIS — Z01810 Encounter for preprocedural cardiovascular examination: Secondary | ICD-10-CM | POA: Insufficient documentation

## 2011-07-23 DIAGNOSIS — IMO0001 Reserved for inherently not codable concepts without codable children: Secondary | ICD-10-CM | POA: Insufficient documentation

## 2011-07-23 DIAGNOSIS — G4730 Sleep apnea, unspecified: Secondary | ICD-10-CM | POA: Insufficient documentation

## 2011-07-23 DIAGNOSIS — M719 Bursopathy, unspecified: Secondary | ICD-10-CM | POA: Insufficient documentation

## 2011-07-23 DIAGNOSIS — M25819 Other specified joint disorders, unspecified shoulder: Secondary | ICD-10-CM | POA: Insufficient documentation

## 2011-07-23 LAB — D-DIMER, QUANTITATIVE (NOT AT ARMC): D-Dimer, Quant: 0.41

## 2011-07-23 LAB — POCT HEMOGLOBIN-HEMACUE: Hemoglobin: 14.3 g/dL (ref 12.0–15.0)

## 2011-08-04 NOTE — Op Note (Signed)
NAME:  Mia [name], Mia [name] NO.:  [id number]  MEDICAL RECORD NO.:  [id number]  LOCATION:                                 FACILITY:  PHYSICIAN:  Jones Broom, MD    DATE OF BIRTH:  1951/05/23  DATE OF PROCEDURE:  07/23/2011 DATE OF DISCHARGE:                              OPERATIVE REPORT   PREOPERATIVE DIAGNOSES: 1. Right shoulder supraspinatus tear. 2. Right shoulder impingement.  POSTOPERATIVE DIAGNOSES: 1. Right shoulder high-grade partial-thickness supraspinatus tear. 2. Right shoulder impingement.  PROCEDURE PERFORMED: 1. Arthroscopic right shoulder takedown and repair of supraspinatus     high-grade partial-thickness tear. 2. Right shoulder arthroscopic subacromial decompression.  ATTENDING SURGEON:  Jones Broom, MD  ASSISTANT:  None.  ANESTHESIA:  GETA with preoperative interscalene block.  COMPLICATIONS:  None.  DRAINS:  None.  SPECIMENS:  None.  ESTIMATED BLOOD LOSS:  Minimal.  INDICATIONS FOR SURGERY:  The patient is a 60 year old female who has had a long history of right shoulder pain.  She was recently treated with ACDF which resolved the symptoms going down her arm but she was left with significant shoulder symptoms.  She did have a history of prior shoulder surgery done in 2000 with a distal clavicle excision and acromioplasty.  She went onto have continued pain in his shoulder despite nonoperative management with injections, rest, and NSAIDs.  MRI revealed what appeared to be a small full-thickness tear and she wished to go forward with surgical management to fix the tear for pain relief. She understood risks, benefits, and alternatives of procedure and wished to go forward.  OPERATIVE FINDINGS:  Examination under anesthesia demonstrated no stiffness or instability.  There was no instability noted at the distal clavicle.  Diagnostic arthroscopy revealed no evidence for tearing of the biceps root or biceps tendon.  The  subscapularis was intact.  Joint surfaces were pristine.  Glenohumeral ligaments were intact.  No loose bodies were noted.  She did have some high-grade partial tearing of the supraspinatus tendon which was debrided.  There did not appear to be any full-thickness tearing but there was a thin Weil of tendon which appeared to be remaining.  Therefore, a PDS suture was used to tag the area of thinness tendon and upon examining the subacromial space, it was extremely thin and therefore was taken down to complete the partial tearing to a full-thickness tear which was then repaired in a double row fashion using one 5.5 mm biocomposite corkscrew anchor and medial row and one 4.5-mm push lock anchor in a lateral row bringing the tendon nicely down to the prepared tuberosity.  She did have some residual or reformed anterior acromial spurring and therefore the acromioplasty was performed creating a nice smooth anterior surface.  PROCEDURE:  The patient was identified in the preoperative holding area where I personally marked the operative site after verifying site side and procedure with the patient.  She was taken back to the operating room where general anesthesia was induced without complication.  She did receive interscalene block by the attending anesthesiologist in the preop holding area.  She was carefully padded and positioned.  Special attention was paid to the  neck given her prior surgery.  The right upper extremity was prepped and draped in a standard sterile fashion and the appropriate time-out procedure was carried out.  She did receive IV Ancef prior to procedure.  A standard posterior portal was established and the arthroscope was introduced in the joint.  An anterior portal was then established with needle localization above the subscapularis.  A small anterior cannula was placed.  Diagnostic arthroscopy was then carried out with findings as described above.  The shaver was  introduced into the joint to debride the undersurface high-grade partial-thickness tearing of the supraspinatus with no full-thickness tearing noted.  A 18- gauge spinal needle was passed percutaneously through the area of most concern on the rotator cuff and a PDS suture was passed and tagged.  The arthroscope was then introduced into the subacromial space where the PDS suture was identified.  There was not significant hypertrophied bursa noted.  Some bursa was excised particularly posteriorly.  The area of the most concern with a PDS suture was carefully probed and felt to be extremely thin.  The instrument could almost push through the remaining tissue here.  Therefore, I felt that given her continued symptoms and MRI findings that a takedown and repair of this high-grade tear would be appropriate.  Therefore through the lateral portal, I used an #11 blade to complete the tear for a distance of approximately 1 cm.  This extended posteriorly from the tagged area.  There was noted to be extremely thin layer of tissue here which was easily cut through with the knife.  A posterolateral viewing portal was then established and thetear edge was cleaned up.  The tuberosity was also cleaned of remaining tissue.  It was noted to be nearly completely bare.  The scope was also used through the lateral portal to directly view the tear in the tuberosity.  The tuberosity was then prepared using a bur down to bleeding bone to promote healing and the repair was then carried out using 1 percutaneously placed 5.5 mm biocomposite corkscrew anchor off the lateral edge of the acromion which was placed just off the edge of the articular surface.  Suture strands were then passed in a horizontal mattress fashion with 1 anterior, 1 posterior, and these were tied down bringing the tendon nicely down to the prepared tuberosity.  The free lateral edge of the tendon was carefully probed and I felt that in order to  bring this down onto the lateral prepared tuberosity, the lateral row with a suture bridge technique would really be more ideal.  Therefore, I used 1 suture from each knot in a lateral row 4.5-mm biocomposite push lock anchor which brought the lateral edge of the tendon nicely down to the prepared tuberosity.  Suture strands were cut and the final repair was viewed from lateral and posterior portal and felt to be excellent. The scar tissue over the anterior acromion was carefully probed and it was felt to be hanging down a bit anteriorly likely to cause impingement and therefore I took down the scar tissue over the anterior acromion. There appeared to be a dividing line where new bone had formed since her prior acromioplasty and therefore I used a bur from lateral portal to resect this excess bone anteriorly in a smooth even fashion from lateral to medial.  The arthroscopic equipment was then removed from the joint and portals were closed using 3-0 nylon in interrupted fashion.  Sterile dressings were applied including Xeroform, 4x4s, ABDs, and  tape.  The patient was placed in a sling, allowed to awaken from general anesthesia, transferred to the stretcher, and taken to the recovery room in stable condition.  POSTOPERATIVE PLAN:  She will be discharged today with her family as long as she is stable in the recovery room.  She will follow up with me in 1 week in my office for suture removal and wound check.  She will remain in her sling until that time.     Jones Broom, MD     JC/MEDQ  D:  07/23/2011  T:  07/23/2011  Job:  [id number]  Electronically Signed by Jones Broom  on 08/04/2011 09:30:06 AM

## 2011-08-10 LAB — POCT I-STAT 4, (NA,K, GLUC, HGB,HCT)
Glucose, Bld: 94
HCT: 44
Hemoglobin: 15
Operator id: 268271
Potassium: 3.9
Sodium: 138

## 2011-09-15 LAB — HEMOGLOBIN A1C WITH EAG: Hemoglobin A1c: 5.9 % — ABNORMAL HIGH (ref 4.8–5.6)

## 2011-09-15 LAB — VITAMIN D, 25 HYDROXY: VITAMIN D, 25-HYDROXY: 37.8 ng/mL (ref 30.0–100.0)

## 2011-09-16 LAB — NMR LIPOPROFILE WITH LIPIDS (WITHOUT GRAPH)
Cholesterol, Total: 133 mg/dL (ref <200)
HDL-C: 45 mg/dL (ref >=40)
HDL-P (Total): 33.7 umol/L (ref >=30.5)
LDL size: 20.1 nm — ABNORMAL LOW (ref >20.5)
LDL-C: 65 mg/dL (ref <100)
LDL-P: 1218 nmol/L — ABNORMAL HIGH (ref <1000)
LP-IR SCORE: 53 — ABNORMAL HIGH (ref <=45)
Small LDL-P: 867 nmol/L — ABNORMAL HIGH (ref <=527)
Triglycerides: 116 mg/dL (ref <150)

## 2011-09-23 ENCOUNTER — Other Ambulatory Visit: Payer: Self-pay | Admitting: Cardiovascular Disease

## 2011-09-30 ENCOUNTER — Other Ambulatory Visit: Payer: Self-pay | Admitting: *Deleted

## 2011-09-30 MED ORDER — PINDOLOL 5 MG PO TABS: 5.0000 mg | ORAL_TABLET | Freq: Every day | ORAL | Status: DC

## 2011-09-30 NOTE — Telephone Encounter (Signed)
Fax Received. Refill Completed. Pattiann Solanki Chowoe (R.M.A)   

## 2011-10-12 ENCOUNTER — Encounter (HOSPITAL_BASED_OUTPATIENT_CLINIC_OR_DEPARTMENT_OTHER): Payer: Self-pay | Admitting: *Deleted

## 2011-10-12 ENCOUNTER — Other Ambulatory Visit: Payer: Self-pay

## 2011-10-12 ENCOUNTER — Emergency Department (HOSPITAL_BASED_OUTPATIENT_CLINIC_OR_DEPARTMENT_OTHER)
Admission: EM | Admit: 2011-10-12 | Discharge: 2011-10-12 | Disposition: A | Discharge status: Home / Self Care | Payer: BC Managed Care – PPO | Attending: Emergency Medicine | Admitting: Emergency Medicine

## 2011-10-12 DIAGNOSIS — E119 Type 2 diabetes mellitus without complications: Secondary | ICD-10-CM | POA: Insufficient documentation

## 2011-10-12 DIAGNOSIS — G4730 Sleep apnea, unspecified: Secondary | ICD-10-CM | POA: Insufficient documentation

## 2011-10-12 DIAGNOSIS — M542 Cervicalgia: Secondary | ICD-10-CM

## 2011-10-12 DIAGNOSIS — K219 Gastro-esophageal reflux disease without esophagitis: Secondary | ICD-10-CM | POA: Insufficient documentation

## 2011-10-12 DIAGNOSIS — Z79899 Other long term (current) drug therapy: Secondary | ICD-10-CM | POA: Insufficient documentation

## 2011-10-12 DIAGNOSIS — I10 Essential (primary) hypertension: Secondary | ICD-10-CM | POA: Insufficient documentation

## 2011-10-12 MED ORDER — DIAZEPAM 5 MG PO TABS
10.0000 mg | ORAL_TABLET | Freq: Once | ORAL | Status: AC
  Administered 2011-10-12: 10 mg via ORAL
  Filled 2011-10-12: qty 2

## 2011-10-12 MED ORDER — KETOROLAC TROMETHAMINE 60 MG/2ML IM SOLN
60.0000 mg | Freq: Once | INTRAMUSCULAR | Status: AC
  Administered 2011-10-12: 60 mg via INTRAMUSCULAR
  Filled 2011-10-12: qty 2

## 2011-10-12 NOTE — ED Provider Notes (Signed)
History     CSN: [id number] Arrival date & time: 10/12/2011  3:40 AM   First MD Initiated Contact with Patient 10/12/11 0329      Chief Complaint  Patient presents with  . Neck Pain    (Consider location/radiation/quality/duration/timing/severity/associated sxs/prior treatment) The history is provided by the patient.   right-sided neck pain. She has had recent neck surgery, as well as, right shoulder surgery by Dr. Ave Filter. She takes muscle relaxers and Nucynta at home "sparingly" and as needed. She is scheduled to start physical therapy next week. She has not been doing any lifting or extensive activity until last night. She helped at a church function and did a lot more physical activity than she has in some time. After going home and laying down in her bed, she noticed onset pain. She took Skelaxin and was unable to sleep. She took NUcynta with minimal relief and now presents here for severe pain. Pain is sharp in quality. It is not radiating. Pain is much worse with any movement of her neck or when she raises her right arm. He has been constant. She has no associated chest pain or shortness of breath. No associated weakness or numbness. No specific trauma. She is able to point with one finger or the pain hurts most. Nausea. No diaphoresis. No rash or swelling. Patient is unable to tolerate any narcotic pain medications as they make her violently ill.   Past Medical History  Diagnosis Date  . SVT (supraventricular tachycardia)   . Sleep apnea   . Raynaud phenomenon   . Edema   . Anemia   . Arthritis   . Blood transfusion   . Diabetes mellitus   . GERD (gastroesophageal reflux disease)     gastritis  . Hypertension   . Neuromuscular disorder     sclerosis  . Neuromuscular disorder     fibromyalgia  . Pseudogout   . Thyroid disease     hypothyroidism  . Hemorrhoids   . Rectal bleeding     Past Surgical History  Procedure Date  . Cholecystectomy   . Appendectomy   .  Tonsillectomy   . Hysterectomy - unknown type   . Knee surgery     x6  . Oophorectomy   . Dilation and curettage of uterus   . Breast biopsy   . Shoulder surgery     bilateral- bones spur  . Neck surgery     fusion  . Quadriceps repair     right  . Eye surgery     retina  . Bladder surgery     x2  . Cataract extraction     right  . Cardiac catheterization   . Rectocele repair     x2  . Enterocele repair     x2    Family History  Problem Relation Age of Onset  . Heart attack    . Stroke    . Diabetes    . Cancer Maternal Grandmother     breast  . Cancer Maternal Grandfather     pancreatic    History  Substance Use Topics  . Smoking status: Never Smoker   . Smokeless tobacco: Not on file  . Alcohol Use: No    OB History    Grav Para Term Preterm Abortions TAB SAB Ect Mult Living                  Review of Systems  Constitutional: Negative for fever and chills.  HENT:  Positive for neck pain. Negative for neck stiffness.   Eyes: Negative for pain.  Respiratory: Negative for shortness of breath.   Cardiovascular: Negative for chest pain, palpitations and leg swelling.  Gastrointestinal: Negative for abdominal pain.  Genitourinary: Negative for dysuria.  Musculoskeletal: Negative for back pain.  Skin: Negative for rash.  Neurological: Negative for headaches.  All other systems reviewed and are negative.    Allergies  Percocet; Codeine; Hydrocodone; Nitrofurantoin; Tylox; Verapamil; and Darvocet  Home Medications   Current Outpatient Rx  Name Route Sig Dispense Refill  . DULOXETINE HCL 30 MG PO CPEP Oral Take 30 mg by mouth daily.      Marland Kitchen METFORMIN HCL 1000 MG PO TABS Oral Take 1,000 mg by mouth 2 (two) times daily with a meal.      . CALCIUM CARBONATE 600 MG PO TABS Oral Take 600 mg by mouth 2 (two) times daily with a meal.      . CYCLOBENZAPRINE HCL 10 MG PO TABS Oral Take 10 mg by mouth at bedtime.      Marland Kitchen DIGOXIN 0.25 MG PO TABS  TAKE 1 TABLET BY  MOUTH EVERY DAY 90 tablet 3  . DILTIAZEM HCL ER COATED BEADS 180 MG PO CP24 Oral Take 180 mg by mouth daily.      Marland Kitchen ARICEPT PO Oral Take by mouth daily.      Marland Kitchen ESTRADIOL 2 MG PO TABS Oral Take 2 mg by mouth daily.      Marland Kitchen FEXOFENADINE HCL 180 MG PO TABS Oral Take 180 mg by mouth daily.     Marland Kitchen FLUTICASONE PROPIONATE 50 MCG/ACT NA SUSP Nasal 2 sprays by Nasal route daily.      . FUROSEMIDE 20 MG PO TABS Oral Take 20 mg by mouth daily.      Marland Kitchen URIBEL PO Oral Take by mouth as needed.      Marland Kitchen NITROGLYCERIN PO Oral Take by mouth as needed.      Marland Kitchen PANTOPRAZOLE SODIUM 40 MG PO TBEC Oral Take 40 mg by mouth daily.      Marland Kitchen PENTOSAN POLYSULFATE SODIUM 100 MG PO CAPS Oral Take 100 mg by mouth 3 (three) times daily before meals.      Marland Kitchen PINDOLOL 5 MG PO TABS Oral Take 1 tablet (5 mg total) by mouth daily. 90 tablet 3  . VITAMIN D 400 UNITS PO TABS Oral Take 400 Units by mouth 2 (two) times daily.        BP 116/60  Pulse 64  Temp(Src) 97.8 F (36.6 C) (Oral)  Resp 16  SpO2 99%  Physical Exam  Constitutional: She is oriented to person, place, and time. She appears well-developed and well-nourished.  HENT:  Head: Normocephalic and atraumatic.  Eyes: Conjunctivae and EOM are normal. Pupils are equal, round, and reactive to light.  Neck: Full passive range of motion without pain.       Point tenderness right paracervical region with associated muscle spasm. No erythema or increased warmth to touch. No swelling. No midline tenderness. Right shoulder is nontender with good range of motion and distal motor and sensorium and pulses to right upper extremity are intact.  Cardiovascular: Normal rate, regular rhythm, S1 normal, S2 normal and intact distal pulses.   Pulmonary/Chest: Effort normal and breath sounds normal.  Abdominal: Soft. Bowel sounds are normal. There is no tenderness. There is no CVA tenderness.  Musculoskeletal: Normal range of motion.  Neurological: She is alert and oriented to person, place,  and  time. She has normal strength and normal reflexes. No cranial nerve deficit or sensory deficit. She displays a negative Romberg sign. GCS eye subscore is 4. GCS verbal subscore is 5. GCS motor subscore is 6.       Normal Gait  Skin: Skin is warm and dry. No rash noted. No cyanosis. Nails show no clubbing.  Psychiatric: She has a normal mood and affect. Her speech is normal and behavior is normal.    ED Course  Procedures (including critical care time)   Date: 10/12/2011  Rate: 67  Rhythm: normal sinus rhythm  QRS Axis: normal  Intervals: normal  ST/T Wave abnormalities: nonspecific ST changes  Conduction Disutrbances:none  Narrative Interpretation:   Old EKG Reviewed: unchanged   Ice, Valium and Toradol by request provided.   Recheck at 0440am feeling much better after toradol and valium.  She is requesting to be discharged home.    MDM   Presentation suggests musculoskeletal neck pain. Screening EKG reviewed as above no ischemia. Pain improved in the emergency department. No indication for imaging based on exam and no mechanism to suggest bony injury. Patient has close followup as needed with her orthopedic surgeon. She agrees to take her pain medications when she gets home as prescribed and is stable for discharge at this time with improving condition. She is a reliable historian and agrees to strict return precautions for any weakness or numbness, any chest pain or shortness of breath, or any worsening condition.        Sunnie Nielsen, MD 10/12/11 [phone]

## 2011-10-12 NOTE — ED Notes (Signed)
Pt state that she had neck surgery in June and rotator cuff surgery in September states that she had a busy day at church and that she began having right neck pain that radiates to her shoulder states that she took nucynta for pain but has not gotten any relief

## 2012-01-26 ENCOUNTER — Other Ambulatory Visit: Payer: Self-pay | Admitting: *Deleted

## 2012-01-26 MED ORDER — DILTIAZEM HCL ER COATED BEADS 180 MG PO CP24: 180.0000 mg | ORAL_CAPSULE | Freq: Every day | ORAL | Status: DC

## 2012-01-26 NOTE — Telephone Encounter (Signed)
Pt needs appointment then refill can be made Fax Received. Refill Completed. Danford Tat Chowoe (R.M.A)   

## 2012-01-27 ENCOUNTER — Other Ambulatory Visit: Payer: Self-pay | Admitting: *Deleted

## 2012-01-27 NOTE — Telephone Encounter (Signed)
Opened in Error.

## 2012-03-15 LAB — METABOLIC PANEL, COMPREHENSIVE
A-G Ratio: 1.6 (ref 0.6–2.2)
ALT (SGPT): 8 U/L (ref 5–30)
AST (SGOT): 10 U/L (ref 7–31)
Albumin: 4.1 g/dL (ref 3.5–5.3)
Alk. phosphatase: 61 U/L (ref 42–98)
BUN/Creatinine ratio: 22
BUN: 12 mg/dL (ref 7–25)
Bilirubin, total: 0.4 mg/dL (ref 0.2–1.3)
CO2: 27.5 mEq/L (ref 25.0–34.5)
Calcium: 8.4 mg/dL (ref 8.1–10.4)
Chloride: 108 mEq/L (ref 94–112)
Creatinine: 0.55 mg/dL (ref 0.40–1.40)
GFR est non-AA: 102 mL/min/{1.73_m2} (ref >59)
GLOBULIN, TOTAL: 2.5 g/dL (ref 2.0–4.8)
Glucose: 105 mg/dL — ABNORMAL HIGH (ref 70–100)
Potassium: 3.9 mEq/L (ref 3.6–5.5)
Protein, total: 6.6 g/dL (ref 6.3–8.5)
Sodium: 143 mEq/L (ref 135–145)
eGFR If African American: 118 mL/min/{1.73_m2} (ref >59)

## 2012-03-15 LAB — CBC WITH AUTOMATED DIFF
ABS. BASOPHILS: 0 10*3/uL (ref 0.0–0.2)
ABS. EOSINOPHILS: 0.1 10*3/uL (ref 0.0–0.4)
ABS. MONOCYTES: 0.4 10*3/uL (ref 0.1–1.0)
ABS. NEUTROPHILS: 3.3 10*3/uL (ref 1.8–7.8)
Abs Lymphocytes: 1.2 10*3/uL (ref 0.7–4.5)
BASOPHILS: 0 % (ref 0–3)
EOSINOPHILS: 1 % (ref 0–7)
HCT: 38.5 % (ref 34.0–46.6)
HGB: 12.5 g/dL (ref 11.1–15.9)
Lymphocytes: 25 % (ref 14–46)
MCH: 30.1 pg (ref 26.6–33.0)
MCHC: 32.5 g/dL (ref 31.5–35.7)
MCV: 93 fL (ref 79–97)
MONOCYTES: 9 % (ref 4–13)
NEUTROPHILS: 65 % (ref 40–74)
PLATELET: 172 10*3/uL (ref 140–415)
RBC: 4.15 x10E6/uL (ref 3.77–5.28)
RDW: 12.8 % (ref 12.3–15.4)
WBC: 5 10*3/uL (ref 4.0–10.5)

## 2012-03-15 LAB — LIPID PANEL
Cholesterol, total: 148 mg/dL (ref 120–200)
HDL Cholesterol: 45 mg/dL (ref 36–85)
LDL, calculated: 82 mg/dL (ref 0–160)
Triglyceride: 107 mg/dL (ref 36–165)
VLDL, calculated: 21 mg/dL (ref 5–40)

## 2012-03-16 LAB — VITAMIN D, 25 HYDROXY: VITAMIN D, 25-HYDROXY: 40.3 ng/mL (ref 30.0–100.0)

## 2012-03-16 LAB — HEMOGLOBIN A1C WITH EAG: Hemoglobin A1c: 6.2 % — ABNORMAL HIGH (ref 4.8–5.6)

## 2012-03-22 MED ORDER — CITALOPRAM 10 MG TAB
10 mg | ORAL_TABLET | Freq: Every day | ORAL | Status: DC
Start: 2012-03-22 — End: 2013-08-21

## 2012-03-22 MED ORDER — ATORVASTATIN 20 MG TAB
20 mg | ORAL_TABLET | Freq: Every day | ORAL | Status: DC
Start: 2012-03-22 — End: 2012-06-03

## 2012-03-22 NOTE — Patient Instructions (Signed)
MyChart Activation    Thank you for requesting access to MyChart. Please follow the instructions below to securely access and download your online medical record. MyChart allows you to send messages to your doctor, view your test results, renew your prescriptions, schedule appointments, and more.    How Do I Sign Up?    1. In your internet browser, go to [email or web address]  2. Click on the First Time User? Click Here link in the Sign In box. You will be redirect to the New Member Sign Up page.  3. Enter your MyChart Access Code exactly as it appears below. You will not need to use this code after you???ve completed the sign-up process. If you do not sign up before the expiration date, you must request a new code.    MyChart Access Code: [email or web address]  Expires: 06/20/2012 10:34 AM (This is the date your MyChart access code will expire)    4. Enter the last four digits of your Social Security Number (xxxx) and Date of Birth (mm/dd/yyyy) as indicated and click Submit. You will be taken to the next sign-up page.  5. Create a MyChart ID. This will be your MyChart login ID and cannot be changed, so think of one that is secure and easy to remember.  6. Create a MyChart password. You can change your password at any time.  7. Enter your Password Reset Question and Answer. This can be used at a later time if you forget your password.   8. Enter your e-mail address. You will receive e-mail notification when new information is available in MyChart.  9. Click Sign Up. You can now view and download portions of your medical record.  10. Click the Download Summary menu link to download a portable copy of your medical information.    Additional Information    If you have questions, please visit the Frequently Asked Questions section of the MyChart website at [email or web address] Remember, MyChart is NOT to be used for urgent needs. For medical emergencies, dial 911.

## 2012-03-22 NOTE — Progress Notes (Signed)
61 year old white female who presents for RPE    We've not seen her in about a year but she seems to be doing well. She has minimal exercise about 2x/week 20 min or so using some type of recumbent bike, no cardiovascular complaints    Denies any GI or GU complaints.    FBS <110, not checking pps.  Denies polyuria, polydipsia, nocturia, vision change.     Past Medical History   Diagnosis Date   ??? 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 Multiple lung nodules      no change 10/06, 03/07, 03/08   ??? Anxiety state, unspecified    ??? Palpitations 2008     neg thallium 2008, nl holter 2008, echo nl lv/ef 65%/tr mr/dd/nl pasp   ??? Prediabetes    ??? Vitamin d deficiency    ??? Arthritis      Dr. Paulina Fusi, Dr. Parks Ranger   ??? Dyslipidemia 03/22/2012   ??? Compression fx, thoracic spine 2007     negative DEXA Dr. Parks Ranger     Past Surgical History   Procedure Date   ??? Hx hemorrhoidectomy      Dr. Derrell Lolling 2007   ??? Endoscopy, colon, diagnostic 2007     negative Dr Derrell Lolling   ??? Echo 2d adult 12/04     normal with EF 70%   ??? Stress test thallium study 2005     negative   ??? Vas carotid duplex bilateral 2004     negative   ??? [place] abd comp 9/05     negative   ??? Hx gyn      s/p BTL      History     Social History   ??? Marital Status: MARRIED     Spouse Name: N/A     Number of Children: 2   ??? 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 Smoking status: Never Smoker    ??? Smokeless tobacco: Not on file   ??? Alcohol Use: No   ??? Drug Use: No   ??? Sexually Active:      Other Topics Concern   ??? Not on file     Social History Narrative   ??? No narrative on file     Family History   Problem Relation Age of Onset   ??? Diabetes Mother    ??? Elevated Lipids Father    ??? Stroke Paternal Grandmother    ??? Heart Disease Paternal Grandfather      Immunization History   Administered Date(s) Administered   ??? Influenza Vaccine Whole 09/14/2008   ??? Influenza Virus Vaccine 07/27/2011   ??? TDAP Vaccine  02/09/2011   ??? Zoster Vaccine, Live 03/22/2012     Current Outpatient Prescriptions   Medication Sig   ??? ergocalciferol (VITAMIN D2) 50,000 unit capsule Take 1 Cap by mouth every seven (7) days.   ??? Omeprazole delayed release (PRILOSEC D/R) 20 mg tablet Take 1 Tab by mouth daily.   ??? atorvastatin (LIPITOR) 20 mg tablet Take 1 Tab by mouth daily.   ??? citalopram (CELEXA) 10 mg tablet Take 1 Tab by mouth daily.   ??? AZASITE 1 % ophthalmic solution    ??? betamethasone valerate (VALISONE) 0.1 % topical cream Apply  to affected area two (2) times a day.   ??? triamterene-hydrochlorothiazide (DYAZIDE) 37.5-25 mg per capsule Take  by mouth daily.     Allergies   Allergen Reactions   ??? Fish Oil Nausea Only   ??? Relafen (Nabumetone) Nausea Only     REVIEW OF SYSTEMS: last gyn and mammo 2012 Dr. Rhys Martini in [place]  Ophtho ??? no vision change or eye pain  Oral ??? no mouth pain, tongue or tooth problems  Ears ??? no hearing loss, ear pain, fullness, no swallowing problems  Cardiac ??? no CP, PND, orthopnea, edema, palpitations or syncope  Chest ??? no breast masses  Resp ??? no wheezing, chronic coughing, dyspnea  Urinary ??? no dysuria, hematuria, flank pain, urgency, frequency  Ortho ??? no swelling, dec ROM, myalgias  Psych ??? denies any anxiety or depression symptoms, no hallucinations or violent ideation  Endo - no polyuria, polydipsia, nocturia, hot flashes    Visit Vitals   Item Reading   ??? BP 108/70   ??? Pulse 80   ??? Temp(Src) 98.4 ??F (36.9 ??C) (Oral)   ??? Ht 5\' 4"  (1.626 m)   ??? Wt 156 lb 8 oz (70.988 kg)   ??? BMI 26.86 kg/m2     Affect is appropriate.  Mood stable  No apparent distress  HEENT --Anicteric sclerae, tympanic membranes normal,  ear canals normal.  Sinuses were nontender, turbinates normal, hearing normal.  Oropharynx without  erythema, normal tongue, oral mucosa and tonsils.  No thyromegaly, JVD, or bruits.    Lungs --Clear to auscultation and percussion, normal percussion.  Heart --Regular rate and rhythm, no murmurs, rubs, gallops,  or clicks.  Chest wall --Nontender to palpation.  PMI normal.  Abdomen -- Soft and nontender, no hepatosplenomegaly or masses.  Extremities -- Without cyanosis, clubbing, edema. 2+ pulses equally and bilaterally.    LABS  From 5/10 showed gluc 110, cr 0.7,               alt 23,                                    chol 154, tg 143, hdl 45, ldl-c 80, tsh 1.91  From 5/10 showed 2 hr GTT 89  From 8/10 showed vit d 23, ck 57, aldo 5.4  From 8/11 showed gluc 108,                                    hba1c 6.3,                   chol 160, tg 172, hdl 39, ldl-c 87, wbc 5.,7 hb 12.3, plt 235, ua neg, tsh 2.28  From 5/12 showed gluc 113, cr 0.71, gfr 94, alt 16, hba1c 6.2, ldl-p 1989, chol 177, tg 161, hdl 43, ldl-c 102  From 11/12 showed                                                  hba1c 5.9, ldl-p 1218, chol 133, tg 116, hdl 45, ldl-c 65    Results for orders placed in visit on 03/15/12   LIPID PANEL       Component Value Range    Cholesterol, total 148  120 - 200 mg/dL    Triglyceride 643  36 - 165  mg/dL    HDL Cholesterol 45  36 - 85 mg/dL    VLDL, calculated 21  5 - 40 mg/dL    LDL, calculated 82  0 - 160 mg/dL   HEMOGLOBIN Z6X       Component Value Range    Hemoglobin A1c 6.2 (*) 4.8 - 5.6 %   VITAMIN D, 25 HYDROXY       Component Value Range    VITAMIN D, 25-HYDROXY 40.3  30.0 - 100.0 ng/mL   CBC WITH AUTOMATED DIFF       Component Value Range    WBC 5.0  4.0 - 10.5 x10E3/uL    RBC 4.15  3.77 - 5.28 x10E6/uL    HGB 12.5  11.1 - 15.9 g/dL    HCT 09.6  04.5 - 40.9 %    MCV 93  79 - 97 fL    MCH 30.1  26.6 - 33.0 pg    MCHC 32.5  31.5 - 35.7 g/dL    RDW 81.1  91.4 - 78.2 %    PLATELET 172  140 - 415 x10E3/uL    NEUTROPHILS 65  40 - 74 %    Lymphocytes 25  14 - 46 %    MONOCYTES 9  4 - 13 %    EOSINOPHILS 1  0 - 7 %    BASOPHILS 0  0 - 3 %    ABS. NEUTROPHILS 3.3  1.8 - 7.8 x10E3/uL    Abs Lymphocytes 1.2  0.7 - 4.5 x10E3/uL    ABS. MONOCYTES 0.4  0.1 - 1.0 x10E3/uL    ABS. EOSINOPHILS 0.1  0.0 - 0.4 x10E3/uL    ABS.  BASOPHILS 0.0  0.0 - 0.2 x10E3/uL   METABOLIC PANEL, COMPREHENSIVE       Component Value Range    Glucose 105 (*) 70 - 100 mg/dL    BUN 12  7 - 25 mg/dL    Creatinine 9.56  2.13 - 1.40 mg/dL    GFR est non-AA 086  >59 mL/min/1.73    eGFR If Africn Am 118  >59 mL/min/1.73    BUN/Creatinine ratio 22      Sodium 143  135 - 145 mEq/L    Potassium 3.9  3.6 - 5.5 mEq/L    Chloride 108  94 - 112 mEq/L    CO2 27.5  25.0 - 34.5 mEq/L    Calcium 8.4  8.1 - 10.4 mg/dL    Protein, total 6.6  6.3 - 8.5 g/dL    Albumin 4.1  3.5 - 5.3 g/dL    GLOBULIN, TOTAL 2.5  2.0 - 4.8 g/dL    A-G Ratio 1.6  0.6 - 2.2    Bilirubin, total 0.4  0.2 - 1.3 mg/dL    Alk. phosphatase 61  42 - 98 U/L    AST 10  7 - 31 U/L    ALT 8  5 - 30 U/L     Assessment and plan:  1. Prediabetes. Lifestyle and dietary modification reiterated. Weight loss would be ideal  2. Hyperlipidemia. Stay on current, she will focus on improving lifestyle and dietary measures  3. Hypovitaminosis D. Continue supp  4. Anxiety.  Refilled the celexa  5. Screenings.  F/U gyn, she will call Dr. Derrell Lolling for f/u  6. Immunizations.  Zosta given, PVX in future        RTC 6 months

## 2012-03-23 ENCOUNTER — Other Ambulatory Visit: Payer: Self-pay | Admitting: Cardiovascular Disease

## 2012-03-23 MED ORDER — DILTIAZEM HCL ER COATED BEADS 180 MG PO CP24: 180.0000 mg | ORAL_CAPSULE | Freq: Every day | ORAL | Status: DC

## 2012-05-24 ENCOUNTER — Other Ambulatory Visit: Payer: Self-pay | Admitting: *Deleted

## 2012-05-24 MED ORDER — DILTIAZEM HCL ER COATED BEADS 180 MG PO CP24: 180.0000 mg | ORAL_CAPSULE | Freq: Every day | ORAL | Status: DC

## 2012-05-24 NOTE — Telephone Encounter (Signed)
Opened in Error.

## 2012-05-24 NOTE — Telephone Encounter (Signed)
Pt needs appointment then refill can be made Fax Received. Refill Completed. Mia [name] (R.M.A)   

## 2012-05-30 ENCOUNTER — Encounter

## 2012-05-30 MED ORDER — ERGOCALCIFEROL (VITAMIN D2) 50,000 UNIT CAP
1250 mcg (50,000 unit) | ORAL_CAPSULE | ORAL | Status: DC
Start: 2012-05-30 — End: 2013-07-03

## 2012-05-30 NOTE — Telephone Encounter (Signed)
Last date seen: 03/22/12  Last date filled: 03/13/11

## 2012-06-03 ENCOUNTER — Encounter

## 2012-06-03 MED ORDER — ATORVASTATIN 20 MG TAB
20 mg | ORAL_TABLET | Freq: Every day | ORAL | Status: DC
Start: 2012-06-03 — End: 2013-03-27

## 2012-06-03 NOTE — Telephone Encounter (Signed)
Last visit: 03/22/2012  RX  Class sent: normal

## 2012-07-15 ENCOUNTER — Ambulatory Visit: Payer: BC Managed Care – PPO | Admitting: Cardiovascular Disease

## 2012-07-26 ENCOUNTER — Other Ambulatory Visit (HOSPITAL_COMMUNITY): Payer: Self-pay | Admitting: Rheumatology

## 2012-07-26 ENCOUNTER — Telehealth: Payer: Self-pay | Admitting: Emergency Medicine

## 2012-07-26 ENCOUNTER — Ambulatory Visit (HOSPITAL_COMMUNITY)
Admission: RE | Admit: 2012-07-26 | Discharge: 2012-07-26 | Disposition: A | Discharge status: Home / Self Care | Payer: BC Managed Care – PPO | Source: Ambulatory Visit | Attending: Rheumatology | Admitting: Rheumatology

## 2012-07-26 DIAGNOSIS — R52 Pain, unspecified: Secondary | ICD-10-CM

## 2012-07-26 DIAGNOSIS — M069 Rheumatoid arthritis, unspecified: Secondary | ICD-10-CM | POA: Insufficient documentation

## 2012-07-26 NOTE — Telephone Encounter (Signed)
Pt states she had pna shot in 0ct 2010 and wanted to know if she was due for another. I advised the pt that the vaccine is good x 5 years.Carron Curie, CMA

## 2012-08-09 ENCOUNTER — Emergency Department (HOSPITAL_BASED_OUTPATIENT_CLINIC_OR_DEPARTMENT_OTHER)
Admission: EM | Admit: 2012-08-09 | Discharge: 2012-08-09 | Disposition: A | Discharge status: Home / Self Care | Payer: BC Managed Care – PPO | Attending: Emergency Medicine | Admitting: Emergency Medicine

## 2012-08-09 ENCOUNTER — Encounter (HOSPITAL_BASED_OUTPATIENT_CLINIC_OR_DEPARTMENT_OTHER): Payer: Self-pay | Admitting: *Deleted

## 2012-08-09 ENCOUNTER — Emergency Department (HOSPITAL_BASED_OUTPATIENT_CLINIC_OR_DEPARTMENT_OTHER): Payer: BC Managed Care – PPO

## 2012-08-09 DIAGNOSIS — W010XXA Fall on same level from slipping, tripping and stumbling without subsequent striking against object, initial encounter: Secondary | ICD-10-CM | POA: Insufficient documentation

## 2012-08-09 DIAGNOSIS — S53409A Unspecified sprain of unspecified elbow, initial encounter: Secondary | ICD-10-CM

## 2012-08-09 DIAGNOSIS — IMO0002 Reserved for concepts with insufficient information to code with codable children: Secondary | ICD-10-CM | POA: Insufficient documentation

## 2012-08-09 DIAGNOSIS — S90819A Abrasion, unspecified foot, initial encounter: Secondary | ICD-10-CM

## 2012-08-09 DIAGNOSIS — S43409A Unspecified sprain of unspecified shoulder joint, initial encounter: Secondary | ICD-10-CM

## 2012-08-09 MED ORDER — ACETAMINOPHEN 325 MG PO TABS
650.0000 mg | ORAL_TABLET | Freq: Once | ORAL | Status: AC
  Administered 2012-08-09: 650 mg via ORAL
  Filled 2012-08-09: qty 2

## 2012-08-09 NOTE — ED Notes (Signed)
Patient tripped and fell on a concrete driveway, fell on her R shoulder and hit head, states that she felt as if she was going to pass out, had trouble standing, rotator cuff surgery 07/25/2011.  Shooting pain down shoulder to elbow. Abrasion to left foot

## 2012-08-09 NOTE — ED Provider Notes (Signed)
History     CSN: [id number]  Arrival date & time 08/09/12  1055   First MD Initiated Contact with Patient 08/09/12 1123      Chief Complaint  Patient presents with  . Shoulder Pain     Patient is a 61 y.o. female presenting with shoulder pain. The history is provided by the patient.  Shoulder Pain This is a new problem. Episode onset: just prior to arrival. The problem occurs constantly. The problem has been gradually worsening. Pertinent negatives include no chest pain, no abdominal pain, no headaches and no shortness of breath. Exacerbated by: palpation. The symptoms are relieved by rest. She has tried rest for the symptoms. The treatment provided mild relief.  Pt reports she tripped/fell and landed on right shoulder She reports h/o rotator cuff surgery previously Also reports right elbow pain.  Also reports pain/abrasion to left foot No loc.  No headache.  No neck or back pain.  No abd pain.  No focal weakness.  She reports immediately after fall she felt weak due to pain, but no LOC.  She can now stand on her own.  No dizziness/weakness prior to fall  Past Medical History  Diagnosis Date  . SVT (supraventricular tachycardia)   . Sleep apnea   . Raynaud phenomenon   . Edema   . Anemia   . Arthritis   . Blood transfusion   . Diabetes mellitus   . GERD (gastroesophageal reflux disease)     gastritis  . Hypertension   . Neuromuscular disorder     sclerosis  . Neuromuscular disorder     fibromyalgia  . Pseudogout   . Thyroid disease     hypothyroidism  . Hemorrhoids   . Rectal bleeding     Past Surgical History  Procedure Date  . Cholecystectomy   . Appendectomy   . Tonsillectomy   . Hysterectomy - unknown type   . Knee surgery     x6  . Oophorectomy   . Dilation and curettage of uterus   . Breast biopsy   . Shoulder surgery     bilateral- bones spur  . Neck surgery     fusion  . Quadriceps repair     right  . Bladder surgery     x2  . Cataract  extraction     right  . Rectocele repair     x2  . Enterocele repair     x2  . Cardiac catheterization   . Eye surgery     retina    Family History  Problem Relation Age of Onset  . Heart attack    . Stroke    . Diabetes    . Cancer Maternal Grandmother     breast  . Cancer Maternal Grandfather     pancreatic    History  Substance Use Topics  . Smoking status: Never Smoker   . Smokeless tobacco: Not on file  . Alcohol Use: No    OB History    Grav Para Term Preterm Abortions TAB SAB Ect Mult Living                  Review of Systems  Respiratory: Negative for shortness of breath.   Cardiovascular: Negative for chest pain.  Gastrointestinal: Negative for abdominal pain.  Neurological: Negative for headaches.  All other systems reviewed and are negative.    Allergies  Percocet; Codeine; Hydrocodone; Nitrofurantoin; Oxycodone-acetaminophen; Verapamil; and Darvocet  Home Medications   Current Outpatient Rx  Name Route Sig Dispense Refill  . DEXILANT PO Oral Take by mouth.    . METHOTREXATE CHEMO INJECTION (PF) 1 GM **50 MG/ML** Intravenous Inject into the vein once.    . SUMATRIPTAN SUCCINATE 50 MG PO TABS Oral Take 50 mg by mouth every 2 (two) hours as needed.    . VENLAFAXINE HCL ER 75 MG PO CP24 Oral Take 75 mg by mouth daily.    Marland Kitchen CALCIUM CARBONATE 600 MG PO TABS Oral Take 600 mg by mouth 2 (two) times daily with a meal.      . CYCLOBENZAPRINE HCL 10 MG PO TABS Oral Take 10 mg by mouth at bedtime.      Marland Kitchen DIGOXIN 0.25 MG PO TABS  TAKE 1 TABLET BY MOUTH EVERY DAY 90 tablet 3  . DILTIAZEM HCL ER COATED BEADS 180 MG PO CP24 Oral Take 1 capsule (180 mg total) by mouth daily. 30 capsule 1    Patient must have office visit for additional refi ...  . ARICEPT PO Oral Take 5 mg by mouth daily.     . DULOXETINE HCL 30 MG PO CPEP Oral Take 30 mg by mouth daily.      Marland Kitchen ESTRADIOL 2 MG PO TABS Oral Take 2 mg by mouth daily.      Marland Kitchen FEXOFENADINE HCL 180 MG PO TABS Oral  Take 180 mg by mouth daily.     Marland Kitchen FLUTICASONE PROPIONATE 50 MCG/ACT NA SUSP Nasal 2 sprays by Nasal route daily.      . FUROSEMIDE 20 MG PO TABS Oral Take 20 mg by mouth daily.      Marland Kitchen METFORMIN HCL 1000 MG PO TABS Oral Take 1,000 mg by mouth 2 (two) times daily with a meal.      . URIBEL PO Oral Take by mouth as needed.      Marland Kitchen NITROGLYCERIN PO Oral Take by mouth as needed.      Marland Kitchen PANTOPRAZOLE SODIUM 40 MG PO TBEC Oral Take 40 mg by mouth daily.      Marland Kitchen PENTOSAN POLYSULFATE SODIUM 100 MG PO CAPS Oral Take 100 mg by mouth 3 (three) times daily before meals.      Marland Kitchen PINDOLOL 5 MG PO TABS Oral Take 1 tablet (5 mg total) by mouth daily. 90 tablet 3  . VITAMIN D 400 UNITS PO TABS Oral Take 400 Units by mouth 2 (two) times daily.        BP 133/75  Pulse 66  Temp 98.7 F (37.1 C) (Oral)  Resp 18  Ht 5\' 6"  (1.676 m)  Wt 149 lb (67.586 kg)  BMI 24.05 kg/m2  SpO2 100%  Physical Exam CONSTITUTIONAL: Well developed/well nourished HEAD AND FACE: Normocephalic/atraumatic EYES: EOMI/PERRL ENMT: Mucous membranes moist NECK: supple no meningeal signs SPINE:entire spine nontender, NEXUS criteria met CV: S1/S2 noted, no murmurs/rubs/gallops noted LUNGS: Lungs are clear to auscultation bilaterally, no apparent distress ABDOMEN: soft, nontender, no rebound or guarding GU:no cva tenderness NEURO: Pt is awake/alert, moves all extremitiesx4, GCS 15, distal neurovascular intact on right UE EXTREMITIES: pulses normal.  No deformity to right shoulder.  No bruising noted.  She can abduct the shoulder to horizontal but is limited due to pain.  Tenderness to palpation of right elbow but no laceration/deformity.  Tenderness/abrasion to left foot on lateral surface.  All other extremities/joints palpated/ranged and nontender SKIN: warm, color normal PSYCH: no abnormalities of mood noted  ED Course  Procedures   No signs of head or neck injury.  No need for CT imaging at this time  1:06 PM Pt denies any  complaints xrays are negative.  She reports she can ambulate She denies headache/weakness Will place in sling and refer back to her ortho (chandler) Pt agreeable  MDM  Nursing notes including past medical history and social history reviewed and considered in documentation xrays reviewed and considered         Joya Gaskins, MD 08/09/12 1308

## 2012-08-10 ENCOUNTER — Ambulatory Visit (INDEPENDENT_AMBULATORY_CARE_PROVIDER_SITE_OTHER): Payer: BC Managed Care – PPO | Admitting: Cardiovascular Disease

## 2012-08-10 ENCOUNTER — Encounter: Payer: Self-pay | Admitting: Cardiovascular Disease

## 2012-08-10 VITALS — BP 131/68 | HR 74 | Ht 66.0 in | Wt 149.1 lb

## 2012-08-10 DIAGNOSIS — I498 Other specified cardiac arrhythmias: Secondary | ICD-10-CM

## 2012-08-10 DIAGNOSIS — I471 Supraventricular tachycardia, unspecified: Secondary | ICD-10-CM

## 2012-08-10 MED ORDER — PROPRANOLOL HCL 10 MG PO TABS: 10.0000 mg | ORAL_TABLET | Freq: Four times a day (QID) | ORAL | Status: DC

## 2012-08-10 MED ORDER — DILTIAZEM HCL ER COATED BEADS 180 MG PO TB24: 180.0000 mg | ORAL_TABLET | Freq: Every day | ORAL | Status: DC

## 2012-08-10 NOTE — Progress Notes (Signed)
[place] Roads Which[place] Date of Birth  November 30, 1950 [facility] [address]     [address] [place], [place]  [address] [phone]  Fax  [phone]   Problems 1. SVT 2. Raynaud's Phenomenon 3. Diabetes Mellitus   History of Present Illness:  Pt has a history of SVT, sleep apnea, normal coronaries, Raynaud's phenomen.   Exercising some.  Limited by ruptured disc in her neck.  She had a falling accident and injured her right shoulder.  She has had a few  episodes of SVT since I last saw her.  She has been walking and has some dyspnea when walking up hills.    Current Outpatient Prescriptions  Medication Sig Dispense Refill  . calcium carbonate (OS-CAL) 600 MG TABS Take 600 mg by mouth 2 (two) times daily with a meal.        . Dexlansoprazole (DEXILANT PO) Take by mouth.      . digoxin (LANOXIN) 0.25 MG tablet TAKE 1 TABLET BY MOUTH EVERY DAY  90 tablet  3  . diltiazem (CARDIZEM CD) 180 MG 24 hr capsule Take 1 capsule (180 mg total) by mouth daily.  30 capsule  1  . Donepezil HCl (ARICEPT PO) Take 5 mg by mouth daily.       . fexofenadine (ALLEGRA) 180 MG tablet Take 180 mg by mouth daily.       . fluticasone (FLONASE) 50 MCG/ACT nasal spray 2 sprays by Nasal route daily.        . folic acid (FOLVITE) 1 MG tablet       . furosemide (LASIX) 20 MG tablet Take 20 mg by mouth daily.        . meloxicam (MOBIC) 15 MG tablet       . metFORMIN (GLUCOPHAGE) 1000 MG tablet Take 1,000 mg by mouth 2 (two) times daily with a meal.        . Meth-Hyo-M Bl-Na Phos-Ph Sal (URIBEL PO) Take by mouth as needed.        . methocarbamol (ROBAXIN) 500 MG tablet       . methotrexate 1 G injection Inject into the vein once.      Marland Kitchen NITROGLYCERIN PO Take by mouth as needed.        . pentosan polysulfate (ELMIRON) 100 MG capsule Take 100 mg by mouth 3 (three) times daily before meals.        . pindolol (VISKEN) 5 MG tablet Take 1 tablet (5 mg total) by mouth daily.  90  tablet  3  . predniSONE (DELTASONE) 5 MG tablet       . SUMAtriptan (IMITREX) 50 MG tablet Take 50 mg by mouth every 2 (two) hours as needed.      . venlafaxine XR (EFFEXOR-XR) 75 MG 24 hr capsule Take 75 mg by mouth daily.      . vitamin D, CHOLECALCIFEROL, 400 UNITS tablet Take 400 Units by mouth 2 (two) times daily.           Allergies  Allergen Reactions  . Percocet (Oxycodone-Acetaminophen) Nausea And Vomiting  . Codeine   . Hydrocodone Nausea And Vomiting  . Nitrofurantoin   . Oxycodone-Acetaminophen Nausea And Vomiting  . Verapamil     REACTION: intolerance  . Darvocet (Propoxyphene-Acetaminophen) Nausea And Vomiting    Past Medical History  Diagnosis Date  . SVT (supraventricular tachycardia)   . Sleep apnea   . Raynaud phenomenon   . Edema   . Anemia   .  Arthritis   . Blood transfusion   . Diabetes mellitus   . GERD (gastroesophageal reflux disease)     gastritis  . Hypertension   . Neuromuscular disorder     sclerosis  . Neuromuscular disorder     fibromyalgia  . Pseudogout   . Thyroid disease     hypothyroidism  . Hemorrhoids   . Rectal bleeding     Past Surgical History  Procedure Date  . Cholecystectomy   . Appendectomy   . Tonsillectomy   . Hysterectomy - unknown type   . Knee surgery     x6  . Oophorectomy   . Dilation and curettage of uterus   . Breast biopsy   . Shoulder surgery     bilateral- bones spur  . Neck surgery     fusion  . Quadriceps repair     right  . Bladder surgery     x2  . Cataract extraction     right  . Rectocele repair     x2  . Enterocele repair     x2  . Cardiac catheterization   . Eye surgery     retina    History  Smoking status  . Never Smoker   Smokeless tobacco  . Not on file    History  Alcohol Use No    Family History  Problem Relation Age of Onset  . Heart attack    . Stroke    . Diabetes    . Cancer Maternal Grandmother     breast  . Cancer Maternal Grandfather     pancreatic      Reviw of Systems:  Reviewed in the HPI.  All other systems are negative.  Physical Exam: BP 131/68  Pulse 74  Ht 5\' 6"  (1.676 m)  Wt 149 lb 1.9 oz (67.64 kg)  BMI 24.07 kg/m2 The patient is alert and oriented x 3.  The mood and affect are normal.  The skin is warm and dry.  Color is normal.  The HEENT exam reveals that the sclera are nonicteric.  The mucous membranes are moist.  The carotids are 2+ without bruits.  There is no thyromegaly.  There is no JVD.  The lungs are clear.  The chest wall is non tender.  The heart exam reveals a regular rate with a normal S1 and S2.  There are no murmurs, gallops, or rubs.  The PMI is not displaced.   Abdominal exam reveals good bowel sounds.  There is no guarding or rebound.  There is no hepatosplenomegaly or tenderness.  There are no masses.  Exam of the legs reveal no clubbing, cyanosis, or edema.  The legs are without rashes.  The distal pulses are intact.  Cranial nerves II - XII are intact.  Motor and sensory functions are intact.  The gait is normal.  ECG: Oct. 16, 2013 - NSR at 74, normal ECG  Assessment / Plan:

## 2012-08-10 NOTE — Patient Instructions (Addendum)
Your physician wants you to follow-up in: 1 year You will receive a reminder letter in the mail two months in advance. If you don't receive a letter, please call our office to schedule the follow-up appointment.  Your physician has recommended you make the following change in your medication:   Stop digoxin Stop pindolol Start propranolol 10 mg as needed for palpitations, may take one every 30 minutes up to 4 doses.

## 2012-08-10 NOTE — Assessment & Plan Note (Signed)
Mia [name] is doing well. She's not had any further episodes of SVT. At this point I think that we can discontinue her digoxin as well as the pindolol. She's been on these medications for a long time. I would rather treat her with diltiazem slow release 180 mg a day. I'll also give her a prescription for propranolol to take on as-needed basis.  I'll see her back in one year for followup visit and EKG.

## 2012-08-17 ENCOUNTER — Encounter: Payer: Self-pay | Admitting: *Deleted

## 2012-09-12 LAB — CBC WITH AUTOMATED DIFF
ABS. BASOPHILS: 0 10*3/uL (ref 0.0–0.2)
ABS. EOSINOPHILS: 0.1 10*3/uL (ref 0.0–0.4)
ABS. MONOCYTES: 0.5 10*3/uL (ref 0.1–0.9)
ABS. NEUTROPHILS: 4.2 10*3/uL (ref 1.4–7.0)
Abs Lymphocytes: 1.4 10*3/uL (ref 0.7–3.1)
BASOPHILS: 0 % (ref 0–3)
EOSINOPHILS: 1 % (ref 0–5)
HCT: 38.6 % (ref 34.0–46.6)
HGB: 12.3 g/dL (ref 11.1–15.9)
Lymphocytes: 22 % (ref 14–46)
MCH: 30 pg (ref 26.6–33.0)
MCHC: 31.9 g/dL (ref 31.5–35.7)
MCV: 94 fL (ref 79–97)
MONOCYTES: 8 % (ref 4–12)
NEUTROPHILS: 69 % (ref 40–74)
PLATELET: 182 10*3/uL (ref 155–379)
RBC: 4.1 x10E6/uL (ref 3.77–5.28)
RDW: 12.9 % (ref 12.3–15.4)
WBC: 6.2 10*3/uL (ref 3.4–10.8)

## 2012-09-13 LAB — VITAMIN D, 25 HYDROXY: VITAMIN D, 25-HYDROXY: 34.4 ng/mL (ref 30.0–100.0)

## 2012-09-13 LAB — HEMOGLOBIN A1C WITH EAG: Hemoglobin A1c: 6.2 % — ABNORMAL HIGH (ref 4.8–5.6)

## 2012-09-14 LAB — NMR LIPOPROFILE WITH LIPIDS (WITHOUT GRAPH)
Cholesterol, Total: 176 mg/dL (ref <200)
HDL-C: 49 mg/dL (ref >=40)
HDL-P (Total): 36 umol/L (ref >=30.5)
LDL size: 20.2 nm — ABNORMAL LOW (ref >20.5)
LDL-C: 96 mg/dL (ref <100)
LDL-P: 1888 nmol/L — ABNORMAL HIGH (ref <1000)
LP-IR SCORE: 74 — ABNORMAL HIGH (ref <=45)
Small LDL-P: 1274 nmol/L — ABNORMAL HIGH (ref <=527)
Triglycerides: 155 mg/dL — ABNORMAL HIGH (ref <150)

## 2012-09-15 NOTE — Progress Notes (Signed)
Reviewed chart in preparation of office visit and have obtained necessary documentation.

## 2012-09-19 NOTE — Progress Notes (Signed)
61 year old white female who presents for f/u    She has minimal exercise about 2x/week 20 min or so using some type of recumbent bike, no cardiovascular complaints    Denies any GI or GU complaints.    FBS <110, not checking pps.  Denies polyuria, polydipsia, nocturia, vision change.      Not much success w attempts at losing weight due to the issues above, as noted below. I fact, weight is inching up    Vitals 09/19/2012 03/22/2012 03/13/2011 02/09/2011   Weight 164 156.5 159 158   Height 5\' 4"  5\' 4"  5\' 4"  5\' 4"    BMI 28.14 26.85 27.28 27.11      Past Medical History   Diagnosis Date   ??? 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 Multiple lung nodules      no change 10/06, 03/07, 03/08   ??? Anxiety state, unspecified    ??? Palpitations 2008     neg thallium 2008, nl holter 2008, echo nl lv/ef 65%/tr mr/dd/nl pasp   ??? Prediabetes    ??? Vitamin D deficiency    ??? Arthritis      Dr. Paulina Fusi, Dr. Parks Ranger   ??? Dyslipidemia 03/22/2012   ??? Compression fx, thoracic spine 2007     negative DEXA Dr. Parks Ranger     Past Surgical History   Procedure Laterality Date   ??? Hx hemorrhoidectomy       Dr. Derrell Lolling 2007   ??? Endoscopy, colon, diagnostic  2007     negative Dr Derrell Lolling   ??? Echo 2d adult  12/04     normal with EF 70%   ??? Stress test thallium study  2005     negative   ??? Vas carotid duplex bilateral  2004     negative   ??? [place] abd comp  9/05     negative   ??? Hx gyn       s/p BTL      History     Social History   ??? Marital Status: MARRIED     Spouse Name: N/A     Number of Children: 2   ??? 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 Smoking status: Never Smoker    ??? Smokeless tobacco: Not on file   ??? Alcohol Use: No   ??? Drug Use: No   ??? Sexually Active:      Other Topics Concern   ??? Not on file     Social History Narrative   ??? No narrative on file     Current Outpatient Prescriptions   Medication Sig   ??? atorvastatin (LIPITOR) 20 mg tablet Take 1 Tab by mouth daily.   ???  ergocalciferol (VITAMIN D2) 50,000 unit capsule Take 1 Cap by mouth every seven (7) days.   ??? citalopram (CELEXA) 10 mg tablet Take 1 Tab by mouth daily.   ??? Omeprazole delayed release (PRILOSEC D/R) 20 mg tablet Take 1 Tab by mouth daily.   ??? AZASITE 1 % ophthalmic solution    ??? betamethasone valerate (VALISONE) 0.1 % topical cream Apply  to affected area two (2) times a day.   ??? triamterene-hydrochlorothiazide (DYAZIDE) 37.5-25 mg per capsule Take  by mouth daily.     No current facility-administered medications for this visit.     Allergies   Allergen Reactions   ???  Fish Oil Nausea Only   ??? Relafen (Nabumetone) Nausea Only     REVIEW OF SYSTEMS: last gyn and mammo 2012 Dr. Rhys Martini in [place]  Ophtho ??? no vision change or eye pain  Oral ??? no mouth pain, tongue or tooth problems  Ears ??? no hearing loss, ear pain, fullness, no swallowing problems  Cardiac ??? no CP, PND, orthopnea, edema, palpitations or syncope  Chest ??? no breast masses  Resp ??? no wheezing, chronic coughing, dyspnea  Urinary ??? no dysuria, hematuria, flank pain, urgency, frequency  Ortho ??? no swelling, dec ROM, myalgias  Psych ??? denies any anxiety or depression symptoms, no hallucinations or violent ideation  Endo - no polyuria, polydipsia, nocturia, hot flashes    Visit Vitals   Item Reading   ??? BP 110/82   ??? Pulse 79   ??? Temp(Src) 98 ??F (36.7 ??C) (Oral)   ??? Resp 18   ??? Ht 5\' 4"  (1.626 m)   ??? Wt 164 lb (74.39 kg)   ??? BMI 28.14 kg/m2   ??? SpO2 97%     Affect is appropriate.  Mood stable  No apparent distress  HEENT --Anicteric sclerae, tympanic membranes normal,  ear canals normal.  Sinuses were nontender, turbinates normal, hearing normal.  Oropharynx without  erythema, normal tongue, oral mucosa and tonsils.  No thyromegaly, JVD, or bruits.    Lungs --Clear to auscultation and percussion, normal percussion.  Heart --Regular rate and rhythm, no murmurs, rubs, gallops, or clicks.  Chest wall --Nontender to palpation.  PMI normal.  Abdomen -- Soft and  nontender, no hepatosplenomegaly or masses.  Extremities -- Without cyanosis, clubbing, edema. 2+ pulses equally and bilaterally.    LABS  From 5/10 showed gluc 110, cr 0.70,               alt 23,                                    chol 154, tg 143, hdl 45, ldl-c 80,                                                        tsh 1.91  From 5/10 showed                   2 hr GTT 89  From 8/10 showed                                                             vit d 23.0, ck 57, aldo 5.4  From 8/11 showed gluc 108,                                      hba1c 6.3,                   chol 160, tg 172, hdl 39, ldl-c 87,  wbc 5.,7 hb 12.3, plt 235, ua neg, tsh 2.28  From 5/12 showed  gluc 113, cr 0.71,  gfr 94,  alt 16, hba1c 6.2, ldl-p 1989, chol 177, tg 161, hdl 43, ldl-c 102  From 11/12 showed                                                    hba1c 5.9, ldl-p 1218, chol 133, tg 116, hdl 45, ldl-c 65  From 5/13 showed gluc 105, cr 0.55, gfr 102, alt 8,   hba1c 6.2,                   chol 148, tg 107, hdl 45, ldl-c 82,  wbc 5.0, hb 12.5, plt 172, vit d 40.3    Results for orders placed in visit on 09/12/12   CBC WITH AUTOMATED DIFF       Result Value Range    WBC 6.2  3.4 - 10.8 x10E3/uL    RBC 4.10  3.77 - 5.28 x10E6/uL    HGB 12.3  11.1 - 15.9 g/dL    HCT 16.1  09.6 - 04.5 %    MCV 94  79 - 97 fL    MCH 30.0  26.6 - 33.0 pg    MCHC 31.9  31.5 - 35.7 g/dL    RDW 40.9  81.1 - 91.4 %    PLATELET 182  155 - 379 x10E3/uL    NEUTROPHILS 69  40 - 74 %    Lymphocytes 22  14 - 46 %    MONOCYTES 8  4 - 12 %    EOSINOPHILS 1  0 - 5 %    BASOPHILS 0  0 - 3 %    ABS. NEUTROPHILS 4.2  1.4 - 7.0 x10E3/uL    Abs Lymphocytes 1.4  0.7 - 3.1 x10E3/uL    ABS. MONOCYTES 0.5  0.1 - 0.9 x10E3/uL    ABS. EOSINOPHILS 0.1  0.0 - 0.4 x10E3/uL    ABS. BASOPHILS 0.0  0.0 - 0.2 x10E3/uL   HEMOGLOBIN A1C       Result Value Range    Hemoglobin A1c 6.2 (*) 4.8 - 5.6 %   NMR LIPOPROFILE       Result Value Range    LDL-P 1888 (*) <1000 nmol/L    LDL-C 96  <100  mg/dL    HDL-C 49  >=78 mg/dL    Triglycerides 295 (*) <150 mg/dL    Cholesterol, Total 621  <200 mg/dL    HDL-P (Total) 30.8  >=30.5 umol/L    Small LDL-P 1274 (*) <=527 nmol/L    LDL size 20.2 (*) >20.5 nm    LP-IR SCORE 74 (*) <=45   VITAMIN D, 25 HYDROXY       Result Value Range    VITAMIN D, 25-HYDROXY 34.4  30.0 - 100.0 ng/mL     Assessment and plan:  1. Prediabetes. Lifestyle and dietary modification reiterated. Weight loss would be ideal  2. Hyperlipidemia. Stay on current, discussed inc the lipitor but she declined for now  3. Hypovitaminosis D. Continue supp  4. Anxiety.  Cotinue celexa  5. Overweight.  Not interested in appetite suppressants at this time        RTC 6 months

## 2012-10-05 ENCOUNTER — Encounter (HOSPITAL_BASED_OUTPATIENT_CLINIC_OR_DEPARTMENT_OTHER): Payer: Self-pay | Admitting: *Deleted

## 2012-10-05 NOTE — Progress Notes (Signed)
Pt here 10/12 for this shoulder-saw dr Melburn Popper 10/13-ekg done clear -will bring all meds and overnight bag- Will need istat

## 2012-10-06 ENCOUNTER — Ambulatory Visit (HOSPITAL_BASED_OUTPATIENT_CLINIC_OR_DEPARTMENT_OTHER): Payer: BC Managed Care – PPO | Admitting: Anesthesiology

## 2012-10-06 ENCOUNTER — Encounter (HOSPITAL_BASED_OUTPATIENT_CLINIC_OR_DEPARTMENT_OTHER)
Admission: RE | Disposition: A | Discharge status: Home / Self Care | Payer: Self-pay | Source: Ambulatory Visit | Attending: Orthopedic Surgery

## 2012-10-06 ENCOUNTER — Ambulatory Visit (HOSPITAL_BASED_OUTPATIENT_CLINIC_OR_DEPARTMENT_OTHER)
Admission: RE | Admit: 2012-10-06 | Discharge: 2012-10-06 | Disposition: A | Discharge status: Home / Self Care | Payer: BC Managed Care – PPO | Source: Ambulatory Visit | Attending: Orthopedic Surgery | Admitting: Orthopedic Surgery

## 2012-10-06 ENCOUNTER — Encounter (HOSPITAL_BASED_OUTPATIENT_CLINIC_OR_DEPARTMENT_OTHER): Payer: Self-pay | Admitting: Anesthesiology

## 2012-10-06 DIAGNOSIS — Z9089 Acquired absence of other organs: Secondary | ICD-10-CM | POA: Insufficient documentation

## 2012-10-06 DIAGNOSIS — Z90710 Acquired absence of both cervix and uterus: Secondary | ICD-10-CM | POA: Insufficient documentation

## 2012-10-06 DIAGNOSIS — M75100 Unspecified rotator cuff tear or rupture of unspecified shoulder, not specified as traumatic: Secondary | ICD-10-CM

## 2012-10-06 DIAGNOSIS — S43429A Sprain of unspecified rotator cuff capsule, initial encounter: Secondary | ICD-10-CM | POA: Insufficient documentation

## 2012-10-06 DIAGNOSIS — Z885 Allergy status to narcotic agent status: Secondary | ICD-10-CM | POA: Insufficient documentation

## 2012-10-06 DIAGNOSIS — W19XXXA Unspecified fall, initial encounter: Secondary | ICD-10-CM | POA: Insufficient documentation

## 2012-10-06 DIAGNOSIS — M129 Arthropathy, unspecified: Secondary | ICD-10-CM | POA: Insufficient documentation

## 2012-10-06 DIAGNOSIS — S46819A Strain of other muscles, fascia and tendons at shoulder and upper arm level, unspecified arm, initial encounter: Secondary | ICD-10-CM | POA: Insufficient documentation

## 2012-10-06 DIAGNOSIS — E119 Type 2 diabetes mellitus without complications: Secondary | ICD-10-CM | POA: Insufficient documentation

## 2012-10-06 DIAGNOSIS — S43499A Other sprain of unspecified shoulder joint, initial encounter: Secondary | ICD-10-CM | POA: Insufficient documentation

## 2012-10-06 DIAGNOSIS — I10 Essential (primary) hypertension: Secondary | ICD-10-CM | POA: Insufficient documentation

## 2012-10-06 DIAGNOSIS — K219 Gastro-esophageal reflux disease without esophagitis: Secondary | ICD-10-CM | POA: Insufficient documentation

## 2012-10-06 DIAGNOSIS — G4730 Sleep apnea, unspecified: Secondary | ICD-10-CM | POA: Insufficient documentation

## 2012-10-06 DIAGNOSIS — I2510 Atherosclerotic heart disease of native coronary artery without angina pectoris: Secondary | ICD-10-CM | POA: Insufficient documentation

## 2012-10-06 DIAGNOSIS — I498 Other specified cardiac arrhythmias: Secondary | ICD-10-CM | POA: Insufficient documentation

## 2012-10-06 DIAGNOSIS — M1120 Other chondrocalcinosis, unspecified site: Secondary | ICD-10-CM | POA: Insufficient documentation

## 2012-10-06 DIAGNOSIS — E039 Hypothyroidism, unspecified: Secondary | ICD-10-CM | POA: Insufficient documentation

## 2012-10-06 DIAGNOSIS — I7300 Raynaud's syndrome without gangrene: Secondary | ICD-10-CM | POA: Insufficient documentation

## 2012-10-06 HISTORY — PX: SHOULDER ARTHROSCOPY WITH SUBACROMIAL DECOMPRESSION, ROTATOR CUFF REPAIR AND BICEP TENDON REPAIR: SHX5687

## 2012-10-06 HISTORY — DX: Other specified postprocedural states: R11.2

## 2012-10-06 HISTORY — DX: Other specified postprocedural states: Z98.890

## 2012-10-06 LAB — GLUCOSE, CAPILLARY: Glucose-Capillary: 114 mg/dL — ABNORMAL HIGH (ref 70–99)

## 2012-10-06 LAB — POCT I-STAT, CHEM 8
BUN: 18 mg/dL (ref 6–23)
Calcium, Ion: 1.23 mmol/L (ref 1.13–1.30)
Chloride: 102 mEq/L (ref 96–112)
Creatinine, Ser: 1 mg/dL (ref 0.50–1.10)
Glucose, Bld: 101 mg/dL — ABNORMAL HIGH (ref 70–99)
HCT: 42 % (ref 36.0–46.0)
Hemoglobin: 14.3 g/dL (ref 12.0–15.0)
Potassium: 4.1 mEq/L (ref 3.5–5.1)
Sodium: 141 mEq/L (ref 135–145)
TCO2: 31 mmol/L (ref 0–100)

## 2012-10-06 SURGERY — SHOULDER ARTHROSCOPY WITH SUBACROMIAL DECOMPRESSION, ROTATOR CUFF REPAIR AND BICEP TENDON REPAIR
Anesthesia: General | Site: Shoulder | Laterality: Right | Wound class: Clean

## 2012-10-06 MED ORDER — HYDROMORPHONE HCL PF 1 MG/ML IJ SOLN: 0.2500 mg | INTRAMUSCULAR | Status: DC | PRN

## 2012-10-06 MED ORDER — PROPOFOL 10 MG/ML IV BOLUS
INTRAVENOUS | Status: DC | PRN
  Administered 2012-10-06: 120 mg via INTRAVENOUS

## 2012-10-06 MED ORDER — LIDOCAINE HCL (CARDIAC) 10 MG/ML IV SOLN
INTRAVENOUS | Status: DC | PRN
  Administered 2012-10-06: 50 mg via INTRAVENOUS

## 2012-10-06 MED ORDER — CEFAZOLIN SODIUM-DEXTROSE 2-3 GM-% IV SOLR
2.0000 g | Freq: Once | INTRAVENOUS | Status: AC
  Administered 2012-10-06: 2 g via INTRAVENOUS

## 2012-10-06 MED ORDER — LACTATED RINGERS IV SOLN
INTRAVENOUS | Status: DC
  Administered 2012-10-06 (×2): via INTRAVENOUS

## 2012-10-06 MED ORDER — SUCCINYLCHOLINE CHLORIDE 20 MG/ML IJ SOLN
INTRAMUSCULAR | Status: DC | PRN
  Administered 2012-10-06: 100 mg via INTRAVENOUS

## 2012-10-06 MED ORDER — FENTANYL CITRATE 0.05 MG/ML IJ SOLN
50.0000 ug | Freq: Once | INTRAMUSCULAR | Status: AC
  Administered 2012-10-06: 100 ug via INTRAVENOUS

## 2012-10-06 MED ORDER — KETOROLAC TROMETHAMINE 30 MG/ML IJ SOLN
30.0000 mg | Freq: Once | INTRAMUSCULAR | Status: AC
  Administered 2012-10-06: 30 mg via INTRAVENOUS

## 2012-10-06 MED ORDER — ACETAMINOPHEN 10 MG/ML IV SOLN
1000.0000 mg | Freq: Once | INTRAVENOUS | Status: AC
  Administered 2012-10-06: 1000 mg via INTRAVENOUS

## 2012-10-06 MED ORDER — BUPIVACAINE-EPINEPHRINE PF 0.5-1:200000 % IJ SOLN
INTRAMUSCULAR | Status: DC | PRN
  Administered 2012-10-06: 25 mL

## 2012-10-06 MED ORDER — HYDROMORPHONE HCL 2 MG PO TABS: ORAL_TABLET | ORAL | Status: DC

## 2012-10-06 MED ORDER — DEXAMETHASONE SODIUM PHOSPHATE 4 MG/ML IJ SOLN
INTRAMUSCULAR | Status: DC | PRN
  Administered 2012-10-06: 4 mg

## 2012-10-06 MED ORDER — PROMETHAZINE HCL 25 MG/ML IJ SOLN
6.2500 mg | INTRAMUSCULAR | Status: DC | PRN
  Administered 2012-10-06: 6.25 mg via INTRAVENOUS

## 2012-10-06 MED ORDER — ONDANSETRON HCL 4 MG/2ML IJ SOLN
INTRAMUSCULAR | Status: DC | PRN
  Administered 2012-10-06: 4 mg via INTRAVENOUS

## 2012-10-06 MED ORDER — MIDAZOLAM HCL 2 MG/2ML IJ SOLN
1.0000 mg | INTRAMUSCULAR | Status: DC | PRN
  Administered 2012-10-06: 2 mg via INTRAVENOUS

## 2012-10-06 MED ORDER — SODIUM CHLORIDE 0.9 % IR SOLN
Status: DC | PRN
  Administered 2012-10-06: 6000 mL

## 2012-10-06 MED ORDER — SCOPOLAMINE 1 MG/3DAYS TD PT72
1.0000 | MEDICATED_PATCH | Freq: Once | TRANSDERMAL | Status: DC
  Administered 2012-10-06: 1.5 mg via TRANSDERMAL

## 2012-10-06 SURGICAL SUPPLY — 89 items
ADH SKN CLS APL DERMABOND .7 (GAUZE/BANDAGES/DRESSINGS) ×1
ANCH SUT 2 FT CRKSW 14.7X5.5 (Anchor) ×1 IMPLANT
ANCHOR CORKSCREW FIBER 5.5X15 (Anchor) ×1 IMPLANT
APL SKNCLS STERI-STRIP NONHPOA (GAUZE/BANDAGES/DRESSINGS)
BENZOIN TINCTURE PRP APPL 2/3 (GAUZE/BANDAGES/DRESSINGS) IMPLANT
BLADE SURG 15 STRL LF DISP TIS (BLADE) IMPLANT
BLADE SURG 15 STRL SS (BLADE) ×2
BLADE SURG ROTATE 9660 (MISCELLANEOUS) IMPLANT
BLADE VORTEX 6.0 (BLADE) IMPLANT
BUR OVAL 4.0 (BURR) ×2 IMPLANT
CANISTER OMNI JUG 16 LITER (MISCELLANEOUS) ×2 IMPLANT
CANISTER SUCTION 2500CC (MISCELLANEOUS) IMPLANT
CANNULA 5.75X71 LONG (CANNULA) ×2 IMPLANT
CANNULA TWIST IN 8.25X7CM (CANNULA) ×1 IMPLANT
CHLORAPREP W/TINT 26ML (MISCELLANEOUS) ×2 IMPLANT
CLOTH BEACON ORANGE TIMEOUT ST (SAFETY) ×2 IMPLANT
DECANTER SPIKE VIAL GLASS SM (MISCELLANEOUS) IMPLANT
DERMABOND ADVANCED (GAUZE/BANDAGES/DRESSINGS) ×1
DERMABOND ADVANCED .7 DNX12 (GAUZE/BANDAGES/DRESSINGS) IMPLANT
DRAPE INCISE IOBAN 66X45 STRL (DRAPES) ×2 IMPLANT
DRAPE STERI 35X30 U-POUCH (DRAPES) ×2 IMPLANT
DRAPE SURG 17X23 STRL (DRAPES) ×1 IMPLANT
DRAPE U 20/CS (DRAPES) ×2 IMPLANT
DRAPE U-SHAPE 47X51 STRL (DRAPES) ×2 IMPLANT
DRAPE U-SHAPE 76X120 STRL (DRAPES) ×4 IMPLANT
DRSG PAD ABDOMINAL 8X10 ST (GAUZE/BANDAGES/DRESSINGS) ×2 IMPLANT
ELECT REM PT RETURN 9FT ADLT (ELECTROSURGICAL) ×2
ELECTRODE REM PT RTRN 9FT ADLT (ELECTROSURGICAL) ×1 IMPLANT
GAUZE SPONGE 4X4 16PLY XRAY LF (GAUZE/BANDAGES/DRESSINGS) IMPLANT
GAUZE XEROFORM 1X8 LF (GAUZE/BANDAGES/DRESSINGS) ×2 IMPLANT
GLOVE BIO SURGEON STRL SZ7 (GLOVE) ×2 IMPLANT
GLOVE BIO SURGEON STRL SZ7.5 (GLOVE) ×4 IMPLANT
GLOVE BIOGEL PI IND STRL 7.0 (GLOVE) ×1 IMPLANT
GLOVE BIOGEL PI IND STRL 8 (GLOVE) ×2 IMPLANT
GLOVE BIOGEL PI INDICATOR 7.0 (GLOVE) ×1
GLOVE BIOGEL PI INDICATOR 8 (GLOVE) ×2
GLOVE ECLIPSE 6.5 STRL STRAW (GLOVE) ×2 IMPLANT
GLOVE INDICATOR 6.5 STRL GRN (GLOVE) ×1 IMPLANT
GOWN BRE IMP PREV XXLGXLNG (GOWN DISPOSABLE) ×1 IMPLANT
GOWN PREVENTION PLUS XLARGE (GOWN DISPOSABLE) ×3 IMPLANT
LASSO 45 DEG LEFT QUICKPASS (SUTURE) ×1 IMPLANT
LASSO CRESCENT QUICKPASS (SUTURE) ×1 IMPLANT
NDL 1/2 CIR CATGUT .05X1.09 (NEEDLE) IMPLANT
NDL SCORPION MULTI FIRE (NEEDLE) IMPLANT
NDL SUT 6 .5 CRC .975X.05 MAYO (NEEDLE) IMPLANT
NEEDLE 1/2 CIR CATGUT .05X1.09 (NEEDLE) IMPLANT
NEEDLE MAYO TAPER (NEEDLE)
NEEDLE SCORPION MULTI FIRE (NEEDLE) ×2 IMPLANT
NS IRRIG 1000ML POUR BTL (IV SOLUTION) IMPLANT
PACK ARTHROSCOPY DSU (CUSTOM PROCEDURE TRAY) ×2 IMPLANT
PACK BASIN DAY SURGERY FS (CUSTOM PROCEDURE TRAY) ×2 IMPLANT
PENCIL BUTTON HOLSTER BLD 10FT (ELECTRODE) ×1 IMPLANT
RESECTOR FULL RADIUS 4.2MM (BLADE) ×2 IMPLANT
SLEEVE SCD COMPRESS KNEE MED (MISCELLANEOUS) ×2 IMPLANT
SLING ARM FOAM STRAP LRG (SOFTGOODS) IMPLANT
SLING ARM FOAM STRAP MED (SOFTGOODS) IMPLANT
SLING ARM FOAM STRAP XLG (SOFTGOODS) IMPLANT
SLING ARM IMMOBILIZER LRG (SOFTGOODS) ×1 IMPLANT
SLING ARM IMMOBILIZER MED (SOFTGOODS) IMPLANT
SPONGE GAUZE 4X4 12PLY (GAUZE/BANDAGES/DRESSINGS) ×2 IMPLANT
SPONGE LAP 4X18 X RAY DECT (DISPOSABLE) ×1 IMPLANT
STRIP CLOSURE SKIN 1/2X4 (GAUZE/BANDAGES/DRESSINGS) IMPLANT
SUCTION FRAZIER TIP 10 FR DISP (SUCTIONS) ×1 IMPLANT
SUPPORT WRAP ARM LG (MISCELLANEOUS) ×1 IMPLANT
SUT 2 FIBERLOOP 20 STRT BLUE (SUTURE) ×2
SUT BONE WAX W31G (SUTURE) IMPLANT
SUT ETHIBOND 2 OS 4 DA (SUTURE) IMPLANT
SUT ETHILON 3 0 PS 1 (SUTURE) ×2 IMPLANT
SUT ETHILON 4 0 PS 2 18 (SUTURE) IMPLANT
SUT FIBERWIRE #2 38 T-5 BLUE (SUTURE)
SUT MNCRL AB 3-0 PS2 18 (SUTURE) IMPLANT
SUT MNCRL AB 4-0 PS2 18 (SUTURE) IMPLANT
SUT PDS AB 0 CT 36 (SUTURE) IMPLANT
SUT PROLENE 3 0 PS 2 (SUTURE) IMPLANT
SUT VIC AB 0 CT1 18XCR BRD 8 (SUTURE) IMPLANT
SUT VIC AB 0 CT1 8-18 (SUTURE)
SUT VIC AB 2-0 SH 18 (SUTURE) IMPLANT
SUT VIC AB 2-0 SH 27 (SUTURE) ×2
SUT VIC AB 2-0 SH 27XBRD (SUTURE) IMPLANT
SUTURE 2 FIBERLOOP 20 STRT BLU (SUTURE) IMPLANT
SUTURE FIBERWR #2 38 T-5 BLUE (SUTURE) IMPLANT
SYR BULB 3OZ (MISCELLANEOUS) IMPLANT
TOWEL OR 17X24 6PK STRL BLUE (TOWEL DISPOSABLE) ×2 IMPLANT
TOWEL OR NON WOVEN STRL DISP B (DISPOSABLE) ×2 IMPLANT
TUBE CONNECTING 20X1/4 (TUBING) ×2 IMPLANT
TUBING ARTHROSCOPY IRRIG 16FT (MISCELLANEOUS) ×2 IMPLANT
WAND STAR VAC 90 (SURGICAL WAND) ×2 IMPLANT
WATER STERILE IRR 1000ML POUR (IV SOLUTION) ×2 IMPLANT
YANKAUER SUCT BULB TIP NO VENT (SUCTIONS) IMPLANT

## 2012-10-06 NOTE — Progress Notes (Signed)
Assisted Dr. Kasik with right, ultrasound guided, interscalene  block. Side rails up, monitors on throughout procedure. See vital signs in flow sheet. Tolerated Procedure well. 

## 2012-10-06 NOTE — H&P (Signed)
Mia [name] is an 61 y.o. female.   Chief Complaint: R shoulder pain  HPI: H/o R RCR, with recent fall and severe increase in pain which has failed conservative management.  MRI shows abnormality in subscapularis which correlates with pain.3  Past Medical History  Diagnosis Date  . SVT (supraventricular tachycardia)   . Sleep apnea   . Raynaud phenomenon   . Edema   . Anemia   . Arthritis   . Blood transfusion   . Diabetes mellitus   . GERD (gastroesophageal reflux disease)     gastritis  . Hypertension   . Neuromuscular disorder     sclerosis  . Neuromuscular disorder     fibromyalgia  . Pseudogout   . Thyroid disease     hypothyroidism  . Hemorrhoids   . Rectal bleeding   . PONV (postoperative nausea and vomiting)     Past Surgical History  Procedure Date  . Cholecystectomy   . Appendectomy   . Tonsillectomy   . Hysterectomy - unknown type   . Knee surgery     x6  . Oophorectomy   . Dilation and curettage of uterus   . Breast biopsy   . Shoulder surgery     bilateral- bones spur  . Neck surgery     fusion  . Quadriceps repair     right  . Bladder surgery     x2  . Cataract extraction     right  . Rectocele repair     x2  . Enterocele repair     x2  . Cardiac catheterization   . Eye surgery     retina    Family History  Problem Relation Age of Onset  . Heart attack    . Stroke    . Diabetes    . Cancer Maternal Grandmother     breast  . Cancer Maternal Grandfather     pancreatic   Social History:  reports that she has never smoked. She does not have any smokeless tobacco history on file. She reports that she does not drink alcohol or use illicit drugs.  Allergies:  Allergies  Allergen Reactions  . Percocet (Oxycodone-Acetaminophen) Nausea And Vomiting  . Codeine   . Hydrocodone Nausea And Vomiting  . Nitrofurantoin   . Oxycodone-Acetaminophen Nausea And Vomiting  . Verapamil     REACTION: intolerance  . Darvocet  (Propoxyphene-Acetaminophen) Nausea And Vomiting    Medications Prior to Admission  Medication Sig Dispense Refill  . calcium carbonate (OS-CAL) 600 MG TABS Take 600 mg by mouth 2 (two) times daily with a meal.        . Dexlansoprazole (DEXILANT PO) Take by mouth.      . diltiazem (CARDIZEM LA) 180 MG 24 hr tablet Take 1 tablet (180 mg total) by mouth daily.  90 tablet  3  . Donepezil HCl (ARICEPT PO) Take 5 mg by mouth daily.       . fexofenadine (ALLEGRA) 180 MG tablet Take 180 mg by mouth daily.       . fluticasone (FLONASE) 50 MCG/ACT nasal spray 2 sprays by Nasal route daily.        . folic acid (FOLVITE) 1 MG tablet       . furosemide (LASIX) 20 MG tablet Take 20 mg by mouth daily.        . meloxicam (MOBIC) 15 MG tablet       . metFORMIN (GLUCOPHAGE) 1000 MG tablet Take 1,000 mg by mouth  2 (two) times daily with a meal.        . Meth-Hyo-M Bl-Na Phos-Ph Sal (URIBEL PO) Take by mouth as needed.        . methocarbamol (ROBAXIN) 500 MG tablet       . methotrexate 1 G injection Inject into the vein once.      Marland Kitchen NITROGLYCERIN PO Take by mouth as needed.        . pentosan polysulfate (ELMIRON) 100 MG capsule Take 100 mg by mouth 3 (three) times daily before meals.        . SUMAtriptan (IMITREX) 50 MG tablet Take 50 mg by mouth every 2 (two) hours as needed.      . tapentadol (NUCYNTA) 50 MG TABS Take by mouth.      . venlafaxine XR (EFFEXOR-XR) 75 MG 24 hr capsule Take 75 mg by mouth daily.      . vitamin D, CHOLECALCIFEROL, 400 UNITS tablet Take 400 Units by mouth 2 (two) times daily.          Results for orders placed during the hospital encounter of 10/06/12 (from the past 48 hour(s))  POCT I-STAT, CHEM 8     Status: Abnormal   Collection Time   10/06/12 11:53 AM      Component Value Range Comment   Sodium 141  135 - 145 mEq/L    Potassium 4.1  3.5 - 5.1 mEq/L    Chloride 102  96 - 112 mEq/L    BUN 18  6 - 23 mg/dL    Creatinine, Ser 4.78  0.50 - 1.10 mg/dL    Glucose, Bld 295  (*) 70 - 99 mg/dL    Calcium, Ion 6.21  3.08 - 1.30 mmol/L    TCO2 31  0 - 100 mmol/L    Hemoglobin 14.3  12.0 - 15.0 g/dL    HCT 65.7  84.6 - 96.2 %    No results found.  Review of Systems  All other systems reviewed and are negative.    Blood pressure 118/63, pulse 81, temperature 98.3 F (36.8 C), temperature source Oral, resp. rate 10, height 5\' 6"  (1.676 m), weight 66.906 kg (147 lb 8 oz), SpO2 100.00%. Physical Exam  Constitutional: She is oriented to person, place, and time. She appears well-developed and well-nourished.  HENT:  Head: Atraumatic.  Eyes: EOM are normal.  Cardiovascular: Intact distal pulses.   Respiratory: Effort normal.  Musculoskeletal:       Right shoulder: She exhibits tenderness and pain.  Neurological: She is alert and oriented to person, place, and time.  Skin: Skin is warm and dry.  Psychiatric: She has a normal mood and affect.     Assessment/Plan R partial subscapularis tear, failed conservative treatment Plan arth debridement versus repair subscapularis, possible biceps tenodesis Risks / benefits of surgery discussed Consent on chart  NPO for OR Preop antibiotics   Dunya Meiners WILLIAM 10/06/2012, 12:33 PM

## 2012-10-06 NOTE — Anesthesia Procedure Notes (Addendum)
Anesthesia Regional Block:  Interscalene brachial plexus block  Pre-Anesthetic Checklist: ,, timeout performed, Correct Patient, Correct Site, Correct Laterality, Correct Procedure, Correct Position, site marked, Risks and benefits discussed,  Surgical consent,  Pre-op evaluation,  At surgeon's request and post-op pain management  Laterality: Right  Prep: chloraprep       Needles:  Injection technique: Single-shot  Needle Type: Echogenic Stimulator Needle     Needle Length: 5cm 5 cm Needle Gauge: 22 and 22 G    Additional Needles:  Procedures: ultrasound guided (picture in chart) and nerve stimulator Interscalene brachial plexus block  Nerve Stimulator or Paresthesia:  Response: 0.48 mA,   Additional Responses:   Narrative:  Start time: 10/06/2012 12:07 PM End time: 10/06/2012 12:15 PM Injection made incrementally with aspirations every 5 mL. Anesthesiologist: Dr Gypsy Balsam  Additional Notes: [id number] R ISB POP CHG prep, sterile tech #22 stim/echo needle with good [place] visualization and stim down to .48ma  Pix in chart Multiple neg asp Vernia Buff .5% w/epi 25cc+decadron 4mg  infil No compl Dr Gypsy Balsam   Procedure Name: Intubation Date/Time: 10/06/2012 1:02 PM Performed by: Gar Gibbon Pre-anesthesia Checklist: Patient identified, Emergency Drugs available, Suction available and Patient being monitored Patient Re-evaluated:Patient Re-evaluated prior to inductionOxygen Delivery Method: Circle system utilized Preoxygenation: Pre-oxygenation with 100% oxygen Intubation Type: IV induction Ventilation: Mask ventilation without difficulty Tube size: 7.0 mm Number of attempts: 1 Airway Equipment and Method: Video-laryngoscopy Placement Confirmation: ETT inserted through vocal cords under direct vision,  breath sounds checked- equal and bilateral and positive ETCO2 Secured at: 21 cm Tube secured with: Tape Dental Injury: Teeth and Oropharynx as per pre-operative assessment   Difficulty Due To: Difficulty was anticipated and Difficult Airway- due to dentition Comments: Airway graded as MP2.  Due to small mouth opening, dentition and position on Schline shoulder positioner Glidescope used for initial attempt. Atraumatic x 1 with ease.

## 2012-10-06 NOTE — Anesthesia Postprocedure Evaluation (Signed)
  Anesthesia Post-op Note  Patient: Mia [name]  Procedure(s) Performed: Procedure(s) (LRB) with comments: SHOULDER ARTHROSCOPY WITH SUBACROMIAL DECOMPRESSION, ROTATOR CUFF REPAIR AND BICEP TENDON REPAIR (Right) - Arthroscopic  Repair  of  Subscapularis, Open Biceps Tenodesis  Patient Location: PACU  Anesthesia Type:GA combined with regional for post-op pain  Level of Consciousness: awake and alert   Airway and Oxygen Therapy: Patient Spontanous Breathing  Post-op Pain: mild  Post-op Assessment: Post-op Vital signs reviewed, Patient's Cardiovascular Status Stable, Respiratory Function Stable, Patent Airway, No signs of Nausea or vomiting and Pain level controlled  Post-op Vital Signs: stable  Complications: No apparent anesthesia complications

## 2012-10-06 NOTE — Anesthesia Preprocedure Evaluation (Signed)
Anesthesia Evaluation  Patient identified by MRN, date of birth, ID band Patient awake    Reviewed: Allergy & Precautions, H&P , NPO status , Patient's Chart, lab work & pertinent test results  History of Anesthesia Complications (+) PONV  Airway Mallampati: I TM Distance: >3 FB Neck ROM: Full    Dental   Pulmonary shortness of breath, sleep apnea ,  breath sounds clear to auscultation        Cardiovascular hypertension, + CAD and + Peripheral Vascular Disease + dysrhythmias Rhythm:Regular Rate:Normal     Neuro/Psych  Neuromuscular disease    GI/Hepatic GERD-  ,  Endo/Other  diabetes  Renal/GU      Musculoskeletal   Abdominal   Peds  Hematology   Anesthesia Other Findings   Reproductive/Obstetrics                           Anesthesia Physical Anesthesia Plan  ASA: III  Anesthesia Plan: General   Post-op Pain Management:    Induction: Intravenous  Airway Management Planned: Oral ETT  Additional Equipment:   Intra-op Plan:   Post-operative Plan: Extubation in OR  Informed Consent: I have reviewed the patients History and Physical, chart, labs and discussed the procedure including the risks, benefits and alternatives for the proposed anesthesia with the patient or authorized representative who has indicated his/her understanding and acceptance.     Plan Discussed with: CRNA and Surgeon  Anesthesia Plan Comments:         Anesthesia Quick Evaluation

## 2012-10-06 NOTE — Op Note (Signed)
Procedure(s): SHOULDER ARTHROSCOPY WITH SUBACROMIAL DECOMPRESSION, ROTATOR CUFF REPAIR AND BICEP TENDON REPAIR Procedure Note  Mia [name] Angelica female 61 y.o. 10/06/2012  Procedure(s): #1 arthroscopic right subscapularis repair #2 arthroscopic right biceps tenotomy and debridement #3 open subpectoral biceps tenodesis right     Surgeon(s) and Role:    * Mable Paris, MD - Primary     Surgeon: Mable Paris   Assistants: Damita Lack PA-C [facility] was present and scrubbed throughout the procedure and was essential in positioning, retraction, exposure, and closure)  Anesthesia: General endotracheal anesthesia with preoperative interscalene block      Procedure Detail  Estimated Blood Loss: Min         Drains: none  Blood Given: none         Specimens: none        Complications:  * No complications entered in OR log *         Disposition: PACU - hemodynamically stable.         Condition: stable    Procedure:   INDICATIONS FOR SURGERY: The patient is 61 y.o. female who has had a history of a right rotator cuff repair which went on to heal. Unfortunately about 2 months ago she fell and reinjured her right shoulder. We tried to treat her conservatively but she went on to have considerable pain and dysfunction anteriorly in the shoulder. An MRI was done which showed the previous rotator cuff repair to be intact but she was noted to have abnormality at the insertion of the subscapularis. She continued to have pain in this region the decision was made to arthroscopically examine this area with debridement versus fixation is indicated and possible subpectoral biceps tenodesis if necessary. She understood risks benefits alternatives to the procedure including but not limited to the risk of bleeding infection damage to neurovascular structures and the risk of incomplete pain relief.  OPERATIVE FINDINGS: Examination under anesthesia: No stiffness or  instability Diagnostic Arthroscopy:  Glenoid articular cartilage: Intact Humeral head articular cartilage: Intact Labrum: Intact Loose bodies: None Synovitis: Moderate anterior and around the base of the biceps tendon Articular sided rotator cuff: She was noted to have near full-thickness tearing of the subscapularis insertion with about a centimeter of retraction of the posterior edge. The upper one half of the tendon was involved. This was repaired using one 5.5 mm peak corkscrew anchor with one horizontal mattress suture and one simple suture in the upper rolled border. The repair was excellent.  There was some fraying of the undersurface but the repair was intact. There was one suture strand noted to be broken which was removed the other suture from the anchor was stretched slightly at the repair site but intact and it was felt that removal of this suture would cause more damage than good. Bursal sided rotator cuff: Intact   DESCRIPTION OF PROCEDURE: The patient was identified in preoperative  holding area where I personally marked the operative site after  verifying site, side, and procedure with the patient. An interscalene block was given by the attending anesthesiologist the holding area.  The patient was taken back to the operating room where general anesthesia was induced without complication and was placed in the beach-chair position with the back  elevated about 60 degrees and all extremities and head and neck carefully padded and  positioned.   The right upper extremity was then prepped and  draped in a standard sterile fashion. The appropriate time-out  procedure was carried out. The patient  did receive IV antibiotics  within 30 minutes of incision.   A small posterior portal incision was made and the arthroscope was introduced into the joint. An anterior portal was then established above the subscapularis using needle localization. A large cannula was placed anteriorly.  Diagnostic arthroscopy was then carried out with findings as described above.  The subscapularis was carefully examined and probed. It was noted to be largely torn from its insertion on the lesser tuberosity. There were a few anterior fibers still intact but the posterior edge in the upper half of the tendon was torn and retracted about a centimeter. The biceps tendon was pulled into the joint and found to be intact but subluxated medially. The root of the biceps also was noted to have moderate synovitis. The decision was made at this point to tenotomized the biceps tendon to protect the subscapularis repair and do to the subluxation. Therefore a large biter was used through the anterior intra-articular portal to release the biceps off of its anchor. Superior labrum was debrided. The biceps was allowed to retract out of the joint. Attention was then turned to the subscapularis where a release of the rotator interval was carried out to help mobilize the subscapularis and assist in the repair. The conjoined tendon was visualized. The tendon was then easily mobilized to its origin at the lesser tuberosity. A second small cannula was placed high and lateral in the rotator interval and the bur was used through the anterior portal to debride the lesser tuberosity down to bleeding bone. A 5.5 mm peak corkscrew anchor was then placed through the anterior portal and the lesser tuberosity. 4 suture strands were then passed with one horizontal mattress and the central third of the tendon in a simple suture passer the upper rolled border. These were tied down bringing the tendon nicely back to the prepared tuberosity in an anatomic way.   Attention was turned to the supraspinatus in the area of the previous repair. There was one broken fiber wire suture which was grasped and removed and discarded. The remaining suture from the repair was still intact. The repair was slightly stretched in this area with some fraying but  largely intact. There is no recurrent tear in this region. I felt that to remove the second suture strand would cause more damage to the rotator cuff and the suture was not likely causing symptoms and would remain in place.  The arthroscope was then introduced into the subacromial space and the bursal surface of the rotator cuff was carefully examined. Previous repair was noted to be intact. There is no recurrent tear. There is some mild fraying of the bursal rotator cuff which was gently debrided with the shaver.  The arthroscopic equipment was removed from the joint and the portals were closed with 3-0 nylon in an interrupted fashion.   Attention was then turned to the axilla where a approximately 3 cm incision was made in the dominant axillary fold. This was about 50% above and 50% below the palpable lower border of the pectoralis major. Dissection was carried out between the lower border of the pectoralis major and the short head of the biceps muscle belly. The anterior humerus was then exposed and the long head biceps was delivered out through the wound. The biceps was prepared using a #2 FiberWire fiber loop and the remaining portion of the biceps tendon was discarded after choosing the appropriate tension and length. A drill bit slightly smaller than the tendon was used in  the distal bicipital groove to create an intramedullary hole and then a drill bit about 12 mm distal to that was used which was slightly larger than the suture passer needle. A crescent suture lasso was then used to pass the sutures from proximal to distal and then one suture was brought around medial and lateral to the tendon. It was tensioned, dunking the tendon into the intramedullary canal and tied over the anterior portion of the tendon. The tension was felt to be appropriate. The wound was copiously irrigated with normal saline and subsequently closed in layers with 2-0 Vicryl in the deep dermal layer and Dermabond for skin  closure.  Sterile dressings were then applied including Xeroform 4 x 4's ABDs and tape. The patient was then allowed to awaken from general anesthesia, placed in a sling, transferred to the stretcher and taken to the recovery room in stable condition.   POSTOPERATIVE PLAN: The patient will be discharged home today and will followup in one week for suture removal and wound check.  She will follow the cuff protocol with no active elbow flexion for 6 weeks.

## 2012-10-06 NOTE — Transfer of Care (Signed)
Immediate Anesthesia Transfer of Care Note  Patient: Mia [name]  Procedure(s) Performed: Procedure(s) (LRB) with comments: SHOULDER ARTHROSCOPY WITH SUBACROMIAL DECOMPRESSION, ROTATOR CUFF REPAIR AND BICEP TENDON REPAIR (Right) - Arthroscopic  Repair  of  Subscapularis, Open Biceps Tenodesis  Patient Location: PACU  Anesthesia Type:GA combined with regional for post-op pain  Level of Consciousness: sedated and patient cooperative  Airway & Oxygen Therapy: Patient Spontanous Breathing and Patient connected to face mask oxygen  Post-op Assessment: Report given to PACU RN and Post -op Vital signs reviewed and stable  Post vital signs: Reviewed and stable  Complications: No apparent anesthesia complications

## 2012-10-10 ENCOUNTER — Encounter (HOSPITAL_BASED_OUTPATIENT_CLINIC_OR_DEPARTMENT_OTHER): Payer: Self-pay | Admitting: Orthopedic Surgery

## 2012-12-16 ENCOUNTER — Encounter

## 2012-12-16 NOTE — Telephone Encounter (Signed)
Last visit:09/19/2012  RX  Class sent: normal

## 2012-12-16 NOTE — Telephone Encounter (Signed)
Why are we calling in both?  Same drug

## 2013-01-23 ENCOUNTER — Telehealth: Payer: Self-pay | Admitting: Cardiovascular Disease

## 2013-01-23 NOTE — Telephone Encounter (Signed)
New Prob    Has been having a lot of SOB doing smaller activities. Also states every time she gets out of the shower, she feels she is going to pass out. Concerned and would like to speak to a nurse.

## 2013-01-23 NOTE — Telephone Encounter (Signed)
HAD LENGTHY CONVERSATION WITH PT  HAS NOTED  A SEVERE DECREASE IN ANY ACTIVITY  AND HAVING SOB  WALKING TO MAIL  BOX , WASHING HAIR , AND HAVING TO PROP SELF  UP WITH PILLOWS TO SLEEP THESE SYMPTOMS HAVE BEEN GOING ON FOR OVER  A MONTH WILL FORWARD TO [facility]   FOR REVIEW .Mia [name]

## 2013-01-24 ENCOUNTER — Encounter: Payer: Self-pay | Admitting: Cardiovascular Disease

## 2013-01-24 ENCOUNTER — Ambulatory Visit (INDEPENDENT_AMBULATORY_CARE_PROVIDER_SITE_OTHER): Payer: BC Managed Care – PPO | Admitting: Cardiovascular Disease

## 2013-01-24 VITALS — BP 130/88 | HR 98 | Ht 66.0 in | Wt 154.8 lb

## 2013-01-24 DIAGNOSIS — R0609 Other forms of dyspnea: Secondary | ICD-10-CM

## 2013-01-24 DIAGNOSIS — I471 Supraventricular tachycardia: Secondary | ICD-10-CM

## 2013-01-24 DIAGNOSIS — R0600 Dyspnea, unspecified: Secondary | ICD-10-CM

## 2013-01-24 DIAGNOSIS — R0789 Other chest pain: Secondary | ICD-10-CM

## 2013-01-24 DIAGNOSIS — R0989 Other specified symptoms and signs involving the circulatory and respiratory systems: Secondary | ICD-10-CM

## 2013-01-24 MED ORDER — DILTIAZEM HCL ER COATED BEADS 240 MG PO CP24: 240.0000 mg | ORAL_CAPSULE | Freq: Every day | ORAL | Status: DC

## 2013-01-24 MED ORDER — NITROGLYCERIN 0.4 MG SL SUBL: 0.4000 mg | SUBLINGUAL_TABLET | SUBLINGUAL | Status: DC | PRN

## 2013-01-24 NOTE — Telephone Encounter (Signed)
Sx started 3 mo ago and now worsening. Denies ankle edema, c/o abd tightening, can't catch breath, unsure of resent bp/p but last she took it it was "ok". App made for today, she accepted plan.

## 2013-01-24 NOTE — Assessment & Plan Note (Signed)
Mia [name] presents today with episodes of severe shortness of breath with any activity-take a shower, sweeping the floor, walking across the room. It is associated with chest tightness or heaviness which is in the lower center of her chest.  She's had these symptoms for quite some time. 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 Her right heart pressures were normal at that time. She's been treated with calcium channel blockers since that time.  Her symptoms are concerning. We'll get a Lexiscan myoview study to rule out ischemia. Will also get an echocardiogram for further assessment of her cardiac function.    We will increase her diltiazem to 240 mg a day. I'll give her a prescription for nitroglycerin to take on an as-needed basis. She's been instructed to take the nitroglycerin that she has these episodes of chest tightness.  We'll see her again in 3 months for followup visit.

## 2013-01-24 NOTE — Progress Notes (Signed)
Mia [name] Date of Birth  10-01-1951 [facility] / [facility] [name]     [address] [place], [place]  [address] [phone]  Fax  [phone]   Problems 1. SVT 2. Raynaud's Phenomenon 3. Diabetes Mellitus 4. Minimal CAD by cath 2006 Mia [name]) 5. Sleep apnea - CPAP is currently not working  Right heart catheterization:  RA pressure: 8 mmHg  RV pressure: 25/7 mmHg  PA pressure: 20/9 mmHg  PWCP: 9 mmHg  CO: 4.6 liters per minute  LEFT HEART CATHETERIZATION RESULTS:  1. Left main: No significant disease.  2. LAD: Mild luminal irregularities.  3. First and second diagonal: Moderate size, mild luminal irregularities.  4. Left circumflex: Nondominant, mild luminal irregularities.  5. RCA: Dominant with mild luminal irregularities.  6. LV: EF is 55 to 60%, no wall motion abnormalities. LV EDP was 9 mmHg.   History of Present Illness:  Pt has a history of SVT, sleep apnea, normal coronaries, Raynaud's phenomen.   Exercising some.  Limited by ruptured disc in her neck.  She had a falling accident and injured her right shoulder.  She has had a few  episodes of SVT since I last saw her.  She has been walking and has some dyspnea when walking up hills.  January 24, 2013:  Mia [name] complains of a fast HR recently.  She has continued to have some dyspnea.    She was vacationing in [place] - she developed strep throat.  .  She has not been able to lie flat while sleeping recently because of    2 pillow orthopnea .  She also has severe DOE with sweeping the kitchen.   Complains of weight in lower chest .  She has severe dyspnea getting out of the shower.   She had right rotator cuff repair and biceps repair several months ago.    Current Outpatient Prescriptions  Medication Sig Dispense Refill  . calcium carbonate (OS-CAL) 600 MG TABS Take 600 mg by mouth 2 (two) times daily with a meal.        . cetirizine (ZYRTEC) 10 MG tablet Take 10 mg by mouth  daily.      . diclofenac sodium (VOLTAREN) 1 % GEL Apply topically as needed.      . diltiazem (CARDIZEM LA) 180 MG 24 hr tablet Take 1 tablet (180 mg total) by mouth daily.  90 tablet  3  . Donepezil HCl (ARICEPT PO) Take 5 mg by mouth daily.       . fluticasone (FLONASE) 50 MCG/ACT nasal spray 2 sprays by Nasal route daily.        . folic acid (FOLVITE) 1 MG tablet Take 1 mg by mouth daily.       . furosemide (LASIX) 20 MG tablet Take 20 mg by mouth daily.        Marland Kitchen HYDROmorphone (DILAUDID) 2 MG tablet Take 1-2 by mouth every 4-6 hours as needed for pain.  60 tablet  0  . metFORMIN (GLUCOPHAGE) 1000 MG tablet Take 500 mg by mouth daily.       . Meth-Hyo-M Bl-Na Phos-Ph Sal (URIBEL PO) Take by mouth as needed.        . methocarbamol (ROBAXIN) 500 MG tablet Take 500 mg by mouth at bedtime.      . methotrexate 1 G injection Inject into the vein once.      . Multiple Vitamin (MULTIVITAMIN) capsule Take 1 capsule by mouth daily.      Marland Kitchen  pantoprazole (PROTONIX) 40 MG tablet Take 40 mg by mouth daily.      . pentosan polysulfate (ELMIRON) 100 MG capsule Take 100 mg by mouth 3 (three) times daily before meals.        . SUMAtriptan (IMITREX) 50 MG tablet Take 50 mg by mouth every 2 (two) hours as needed.      . tapentadol (NUCYNTA) 50 MG TABS Take by mouth.      . venlafaxine XR (EFFEXOR-XR) 75 MG 24 hr capsule Take 75 mg by mouth daily.      . vitamin D, CHOLECALCIFEROL, 400 UNITS tablet Take 400 Units by mouth 2 (two) times daily.         No current facility-administered medications for this visit.     Allergies  Allergen Reactions  . Percocet (Oxycodone-Acetaminophen) Nausea And Vomiting  . Codeine   . Hydrocodone Nausea And Vomiting  . Nitrofurantoin   . Oxycodone-Acetaminophen Nausea And Vomiting  . Verapamil     REACTION: intolerance  . Darvocet (Propoxyphene-Acetaminophen) Nausea And Vomiting    Past Medical History  Diagnosis Date  . SVT (supraventricular tachycardia)   . Sleep  apnea   . Raynaud phenomenon   . Edema   . Anemia   . Arthritis   . Blood transfusion   . Diabetes mellitus   . GERD (gastroesophageal reflux disease)     gastritis  . Hypertension   . Neuromuscular disorder     sclerosis  . Neuromuscular disorder     fibromyalgia  . Pseudogout   . Thyroid disease     hypothyroidism  . Hemorrhoids   . Rectal bleeding   . PONV (postoperative nausea and vomiting)     Past Surgical History  Procedure Laterality Date  . Cholecystectomy    . Appendectomy    . Tonsillectomy    . Hysterectomy - unknown type    . Knee surgery      x6  . Oophorectomy    . Dilation and curettage of uterus    . Breast biopsy    . Shoulder surgery      bilateral- bones spur  . Neck surgery      fusion  . Quadriceps repair      right  . Bladder surgery      x2  . Cataract extraction      right  . Rectocele repair      x2  . Enterocele repair      x2  . Cardiac catheterization    . Eye surgery      retina  . Shoulder arthroscopy with subacromial decompression, rotator cuff repair and bicep tendon repair  10/06/2012    Procedure: SHOULDER ARTHROSCOPY WITH SUBACROMIAL DECOMPRESSION, ROTATOR CUFF REPAIR AND BICEP TENDON REPAIR;  Surgeon: Mia Paris, MD;  Location: [facility];  Service: Orthopedics;  Laterality: Right;  Arthroscopic  Repair  of  Subscapularis, Open Biceps Tenodesis    History  Smoking status  . Never Smoker   Smokeless tobacco  . Not on file    History  Alcohol Use No    Family History  Problem Relation Age of Onset  . Heart attack    . Stroke    . Diabetes    . Cancer Maternal Grandmother     breast  . Cancer Maternal Grandfather     pancreatic    Reviw of Systems:  Reviewed in the HPI.  All other systems are negative.  Physical Exam: BP 130/88  Pulse 98  Ht 5\' 6"  (1.676 m)  Wt 154 lb 12.8 oz (70.217 kg)  BMI 25 kg/m2 The patient is alert and oriented x 3.  The mood and affect are normal.   The skin is warm and dry.  Color is normal.  The HEENT exam reveals that the sclera are nonicteric.  The mucous membranes are moist.  The carotids are 2+ without bruits.  There is no thyromegaly.  There is no JVD.  The lungs are clear.  The chest wall is non tender.  The heart exam reveals a regular rate with a normal S1 and S2.  There are no murmurs, gallops, or rubs.  The PMI is not displaced.   Abdominal exam reveals good bowel sounds.  There is no guarding or rebound.  There is no hepatosplenomegaly or tenderness.  There are no masses.  Exam of the legs reveal no clubbing, cyanosis, or edema.  The legs are without rashes.  The distal pulses are intact.  Cranial nerves II - XII are intact.  Motor and sensory functions are intact.  The gait is normal.  ECG: Oct. 16, 2013 - NSR at 74, normal ECG  Assessment / Plan:

## 2013-01-24 NOTE — Patient Instructions (Signed)
Your physician has requested that you have an echocardiogram. Echocardiography is a painless test that uses sound waves to create images of your heart. It provides your doctor with information about the size and shape of your heart and how well your heart's chambers and valves are working. This procedure takes approximately one hour. There are no restrictions for this procedure.  Your physician has requested that you have a lexiscan myoview.  Please follow instruction sheet, as given.   Your physician has recommended you make the following change in your medication:   INCREASE CARDIZEM TO 240 MG DAILY START NITROGLYCERIN One tablet under the tongue for chest pain, may take another in 5 minutes if chest pain continues, may take up to 3 times. If pain continues call 911, this med may cause headache and or low blood pressure.  Your physician recommends that you schedule a follow-up appointment in: ABOUT 3 MONTHS WITH EKG

## 2013-01-26 ENCOUNTER — Ambulatory Visit (HOSPITAL_COMMUNITY): Payer: BC Managed Care – PPO | Attending: Cardiology

## 2013-01-26 DIAGNOSIS — R072 Precordial pain: Secondary | ICD-10-CM

## 2013-01-26 DIAGNOSIS — R0609 Other forms of dyspnea: Secondary | ICD-10-CM

## 2013-01-26 DIAGNOSIS — R0789 Other chest pain: Secondary | ICD-10-CM

## 2013-01-26 DIAGNOSIS — R0600 Dyspnea, unspecified: Secondary | ICD-10-CM

## 2013-01-26 DIAGNOSIS — R0989 Other specified symptoms and signs involving the circulatory and respiratory systems: Secondary | ICD-10-CM | POA: Insufficient documentation

## 2013-01-26 NOTE — Progress Notes (Signed)
Echocardiogram performed.  

## 2013-01-31 ENCOUNTER — Ambulatory Visit (HOSPITAL_COMMUNITY): Payer: BC Managed Care – PPO | Attending: Cardiovascular Disease | Admitting: Radiology

## 2013-01-31 VITALS — BP 127/77 | HR 59 | Ht 66.0 in | Wt 156.0 lb

## 2013-01-31 DIAGNOSIS — R0600 Dyspnea, unspecified: Secondary | ICD-10-CM

## 2013-01-31 DIAGNOSIS — E119 Type 2 diabetes mellitus without complications: Secondary | ICD-10-CM | POA: Insufficient documentation

## 2013-01-31 DIAGNOSIS — R0989 Other specified symptoms and signs involving the circulatory and respiratory systems: Secondary | ICD-10-CM | POA: Insufficient documentation

## 2013-01-31 DIAGNOSIS — R079 Chest pain, unspecified: Secondary | ICD-10-CM

## 2013-01-31 DIAGNOSIS — Z8249 Family history of ischemic heart disease and other diseases of the circulatory system: Secondary | ICD-10-CM | POA: Insufficient documentation

## 2013-01-31 DIAGNOSIS — R5381 Other malaise: Secondary | ICD-10-CM | POA: Insufficient documentation

## 2013-01-31 DIAGNOSIS — R002 Palpitations: Secondary | ICD-10-CM | POA: Insufficient documentation

## 2013-01-31 DIAGNOSIS — R0609 Other forms of dyspnea: Secondary | ICD-10-CM | POA: Insufficient documentation

## 2013-01-31 DIAGNOSIS — I2510 Atherosclerotic heart disease of native coronary artery without angina pectoris: Secondary | ICD-10-CM

## 2013-01-31 DIAGNOSIS — R0602 Shortness of breath: Secondary | ICD-10-CM

## 2013-01-31 DIAGNOSIS — R110 Nausea: Secondary | ICD-10-CM | POA: Insufficient documentation

## 2013-01-31 DIAGNOSIS — I10 Essential (primary) hypertension: Secondary | ICD-10-CM | POA: Insufficient documentation

## 2013-01-31 DIAGNOSIS — R0789 Other chest pain: Secondary | ICD-10-CM

## 2013-01-31 MED ORDER — REGADENOSON 0.4 MG/5ML IV SOLN
0.4000 mg | Freq: Once | INTRAVENOUS | Status: AC
  Administered 2013-01-31: 0.4 mg via INTRAVENOUS

## 2013-01-31 MED ORDER — TECHNETIUM TC 99M SESTAMIBI GENERIC - CARDIOLITE
33.0000 | Freq: Once | INTRAVENOUS | Status: AC | PRN
  Administered 2013-01-31: 33 via INTRAVENOUS

## 2013-01-31 MED ORDER — TECHNETIUM TC 99M SESTAMIBI GENERIC - CARDIOLITE
11.0000 | Freq: Once | INTRAVENOUS | Status: AC | PRN
  Administered 2013-01-31: 11 via INTRAVENOUS

## 2013-01-31 NOTE — Progress Notes (Signed)
[facility] SITE 3 NUCLEAR MED [address] [place], [place] [address] [phone]    Cardiology Nuclear Med Study  Mia [name] is a 62 y.o. female     MRN : [id number]     DOB: May 16, 1951  Procedure Date: 01/31/2013  Nuclear Med Background Indication for Stress Test:  Evaluation for Ischemia History:  '06 n/o CAD, EF=60%; '08 OZH:YQMVHQ, EF=63%; 01/26/13 Echo:EF=65% Cardiac Risk Factors: Family History - CAD, Hypertension and NIDDM  Symptoms:  "Weighted" Feeling on Chest with and without Exertion (last episode of discomfort was yesterday), Diaphoresis, DOE-severe with minimal activity, Fatigue, Nausea and Palpitations   Nuclear Pre-Procedure Caffeine/Decaff Intake:  None NPO After: 7:30pm   Lungs:  Clear. O2 Sat: 98% on room air. IV 0.9% NS with Angio Cath:  22g  IV Site: R Antecubital  IV Started by:  Doyne Keel, CNMT  Chest Size (in):  34 Cup Size: C  Height: 5\' 6"  (1.676 m)  Weight:  156 lb (70.761 kg)  BMI:  Body mass index is 25.19 kg/(m^2). Tech Comments:  Held metformin and lasix this am; no CBG this am    Nuclear Med Study 1 or 2 day study: 1 day  Stress Test Type:  Treadmill/Lexiscan  Reading MD: Cassell Clement, MD  Order Authorizing Provider:  Kristeen Miss, MD  Resting Radionuclide: Technetium 70m Sestamibi  Resting Radionuclide Dose: 11.0 mCi   Stress Radionuclide:  Technetium 56m Sestamibi  Stress Radionuclide Dose: 32.9 mCi           Stress Protocol Rest HR: 59 Stress HR: 141  Rest BP: 127/77 Stress BP: 141/75  Exercise Time (min): 2:00 METS: n/a   Predicted Max HR: 159 bpm % Max HR: 88.68 bpm Rate Pressure Product: 46962   Dose of Adenosine (mg):  n/a Dose of Lexiscan: 0.4 mg  Dose of Atropine (mg): n/a Dose of Dobutamine: n/a mcg/kg/min (at max HR)  Stress Test Technologist: Smiley Houseman, CMA-N  Nuclear Technologist:  Domenic Polite, CNMT     Rest Procedure:  Myocardial perfusion imaging was performed at rest 45 minutes following  the intravenous administration of Technetium 109m Sestamibi.  Rest ECG: NSR - Normal EKG  Stress Procedure:  The patient received IV Lexiscan 0.4 mg over 15-seconds with concurrent low level exercise and then Technetium 23m Sestamibi was injected at 30-seconds while the patient continued walking one more minute.  The patient became significantly short of breath with O2 SATS of 99% within 30-seconds of walking low level and c/o her chest feeling "heavy".    Her heart rate also increased from 56-141 within one minute.  Quantitative spect images were obtained after a 45-minute delay.  Stress ECG: No significant change from baseline ECG  QPS Raw Data Images:  Normal; no motion artifact; normal heart/lung ratio. Stress Images:  Normal homogeneous uptake in all areas of the myocardium. Rest Images:  Normal homogeneous uptake in all areas of the myocardium. Subtraction (SDS):  No evidence of ischemia. Transient Ischemic Dilatation (Normal <1.22):  0.98 Lung/Heart Ratio (Normal <0.45):  0.36  Quantitative Gated Spect Images QGS EDV:  73 ml QGS ESV:  30 ml  Impression Exercise Capacity:  Lexiscan with low level exercise. BP Response:  Normal blood pressure response. Clinical Symptoms:  There is dyspnea. ECG Impression:  No significant ST segment change suggestive of ischemia. Comparison with Prior Nuclear Study: No significant change from previous study  Overall Impression:  Normal stress nuclear study. No evidence of ischemia.  Heart rate accelerated  very rapidly on walking lexiscan protocol.  LV Ejection Fraction: 59%.  LV Wall Motion:  NL LV Function; NL Wall Motion   [employer]

## 2013-02-08 ENCOUNTER — Encounter: Payer: Self-pay | Admitting: Emergency Medicine

## 2013-02-08 ENCOUNTER — Ambulatory Visit (INDEPENDENT_AMBULATORY_CARE_PROVIDER_SITE_OTHER): Payer: BC Managed Care – PPO | Admitting: Emergency Medicine

## 2013-02-08 VITALS — BP 138/78 | HR 89 | Temp 97.7°F | Ht 66.0 in | Wt 156.8 lb

## 2013-02-08 DIAGNOSIS — G4730 Sleep apnea, unspecified: Secondary | ICD-10-CM

## 2013-02-08 DIAGNOSIS — R0609 Other forms of dyspnea: Secondary | ICD-10-CM

## 2013-02-08 NOTE — Assessment & Plan Note (Signed)
She has had reassuring w/u. Not clear that there is any pulm process here. ? Whether this relates to the HR issue or even to her cardiac meds.  - could consider sniff test, but her HD is normal on CXR - will try to manage her OSA first - have asked her to push her exercise, work on presumed deconditioning

## 2013-02-08 NOTE — Progress Notes (Signed)
History of Present Illness:  62 yo woman never smoker with hx paroxismal SVT, Holter showed correlation at the time with exertional dyspnea and lightheadedness. PFT, methacholine and CPEX were reassuring except exercise limited by tachycardia. Has undergone CPAP titration and needs 14cm H2O pressure   ROV 07/30/09 -- Not using nasal pillows, using the nasal mask. Has been switched to auto-titration device due to difficulty tolerating 14cm H2O. Has had better compliance with latest mask. Wears it every night, When she wakes up in the middle of the night she takes it off. Can't really tell any difference in how she feels during the day - does have less fibromyalgia pain since she's been using it.   January 10, 2010--Presents for acute office visit. Complains of occ pain in left upper back wrapping around to the left breast when she takes a deep breath x8weeks. Happens intermittently, sore to touch, pain is under shoulder blade w/ radiation around to rbs. Use muscle relaxer patch did not help, no other meds used. Denies chest pain, dyspnea, orthopnea, hemoptysis, fever, n/v/d, edema, headache,recent travel or antibiotics.   ROV 08/25/10 -- Hx OSA and HTN. Was started on diltiazem about 6 weeks ago. Began to develop B LE edema, L>R, about 2 weeks ago. US showed no DVT, CT scan chest done, no PE showed "? PNA" so treated with levaquin. She had absolutely no symptoms at that time. She has been having some exertional dyspnea, occasionally feels her tachycardia, not sure the two correlate. Tells me she hasn't worn her CPAP in about 4 months because her mask is hurting her face.   ROV 09/04/10 -- Returns to f/u her edema, exertional SOB. I performed 24h urine, TTE last time - both are reassuring. She has not been wearing her CPAP mask reliably, needs a new mask.   ROV 02/08/13 -- return visit with hx OSA (on CPAP), HTN, SVT, exertional dyspnea s/p extensive w/u (negative methacholine, reassuring CPST. She was seen  by Dr Elease Hashimoto 01/24/13 for similar sx, tachycardia. Underwent TTE and stress testing >> normal. Her HR and BP were high. She has been off of CPAP for two years. She started back 2 weeks ago. Just got a new full face mask > having leakage problem. [employer] is the company. She says she is on auto-set, not 14cm H2O.    Filed Vitals:   02/08/13 1013  BP: 138/78  Pulse: 89  Temp: 97.7 F (36.5 C)  TempSrc: Oral  Height: 5\' 6"  (1.676 m)  Weight: 156 lb 12.8 oz (71.124 kg)  SpO2: 99%   Gen: Pleasant, well-nourished, in no distress,  normal affect  ENT: No lesions,  mouth clear,  oropharynx clear, no postnasal drip  Neck: No JVD, no TMG, no carotid bruits  Lungs: No use of accessory muscles, no dullness to percussion, clear without rales or rhonchi  Cardiovascular: RRR, heart sounds normal, no murmur or gallops, no peripheral edema  Musculoskeletal: No deformities, no cyanosis or clubbing  Neuro: alert, non focal  Skin: Warm, no lesions or rashes    Cardiac Stress Test 02/01/13: Impression  Exercise Capacity: Lexiscan with low level exercise.  BP Response: Normal blood pressure response.  Clinical Symptoms: There is dyspnea.  ECG Impression: No significant ST segment change suggestive of ischemia.  Comparison with Prior Nuclear Study: No significant change from previous study  Overall Impression: Normal stress nuclear study. No evidence of ischemia. Heart rate accelerated very rapidly on walking lexiscan protocol.  LV Ejection Fraction: 59%. LV Wall Motion:  NL LV Function; NL Wall Motion  DYSPNEA ON EXERTION She has had reassuring w/u. Not clear that there is any pulm process here. ? Whether this relates to the HR issue or even to her cardiac meds.  - could consider sniff test, but her HD is normal on CXR - will try to manage her OSA first - have asked her to push her exercise, work on presumed deconditioning  SLEEP APNEA - will send her back for titration study and mask fitting  optimization.

## 2013-02-08 NOTE — Assessment & Plan Note (Signed)
-   will send her back for titration study and mask fitting optimization.

## 2013-02-08 NOTE — Patient Instructions (Addendum)
We will perform a CPAP titration and mask-fit optimization Continue to work on increasing your walking and your cardiopulmonary conditioning Follow with Dr Elease Hashimoto as planned Follow with Dr Delton Coombes in 2 months or sooner if you have any problems.

## 2013-02-09 NOTE — Progress Notes (Signed)
[facility] ??? [facility]  [address] [place] [address]  [place], [place] [address]  [phone]  [phone] fax    Plan of Care/ Statement of Necessity for Physical Therapy Services    Patient name: Laura Roman      Start of Care: 02/09/2013   Referral source: Alisa Graff, MD      DOB: 07/19/1951   Diagnosis: Pain in joint, shoulder region [719.41]       Onset Date:October 2013    Prior Hospitalization:see medical history    Provider#: [id number]  Comorbidities: n/a  Prior Level of Function:I with activities without limiation           The Plan of Care and following information is based on the information from the initial evaluation.  Assessment/ key information: patient presents to PT with signs and symptoms with R shoulder adhesive capsulitis. R shoulder AROM measures as follows: flexion 65 degrees, ER 8 degrees, scaption 60 degrees, IR to PSIS. PROM flexion 90, ER 12 degrees, abduction 80 degrees, IR 33 degrees. Unable to assess strength due to pain with motion.  + capsular restrictions throughout.    Problem List: pain affecting function, decrease ROM, decrease strength, decrease ADL/ functional abilitiies, decrease activity tolerance and decrease flexibility/ joint mobility     Treatment Plan may include any combination of the following: Therapeutic exercise, Therapeutic activities, Neuromuscular re-education, Physical agent/modality, Manual therapy and Patient education    Patient / Family readiness to learn indicated by: asking questions, trying to perform skills and interest    Persons(s) to be included in education: patient (P)    Barriers to Learning/Limitations: no    Patient Goal (s): ???To improve ROM and decrease pain???    Rehabilitation Potential: good    Short Term Goals: To be accomplished in 1-2  weeks:  1. Patient will be I with HEP to improve outcomes  2. Improve R shoulder PROM flexion/scaption by 10 degrees for improved ability for ADLs.   Long Term Goals: To be accomplished in  4  weeks:  1. Improve Quick Dash score by at least 16% for increased ability for functional activities.  2.  Increase R shoulder PROM flexion to measure 130 degrees for increased ability for activity.  3.  Increase R shoulder PROM IR to measure 60 degrees for increased ability for activity.  4.  Increase R shoulder PROM ER to measure 40 degrees for increased ability for activity.    Frequency / Duration: Patient to be seen 3 times per week for 4 weeks.    Patient/ Caregiver education and instruction: self care and exercises    Kendal Hymen, PT 02/09/2013 12:19 PM    ________________________________________________________________________    I certify that the above Therapy Services are being furnished while the patient is under my care. I agree with the treatment plan and certify that this therapy is necessary.    Physician's Signature:____________________  Date:____________Time: _________    Please sign and return to [facility] ??? [facility]  [address] [place] [address]  [place], [place] [address]  [phone]  [phone] fax

## 2013-02-09 NOTE — Progress Notes (Signed)
PT DAILY TREATMENT NOTE     Patient Name: Laura Roman  Date:02/09/2013  DOB: 31-Jul-1951  [x]   Patient DOB Verified  Payor: BLUE CROSS  Plan: [place] BLUE [place]OSS FEDERAL  Product Type: PPO     In time:815  Out time:900  Total Treatment Time (min): 45  Visit #: 1 of 12    Treatment Area: Pain in joint, shoulder region [719.41]    SUBJECTIVE  Pain Level (0-10 scale): 7  Any medication changes, allergies to medications, adverse drug reactions, diagnosis change, or new procedure performed?: [x]  No    []  Yes (see summary sheet for update)  Subjective functional status/changes:   []  No changes reported       OBJECTIVE  Modality rationale:       min []  Estim, type:                                          []   att     []   unatt     []   w/US     []   w/ice    []   w/heat    min []   Mechanical Traction: type/lbs                                               []   pro   []   sup   []   int   []   cont    min []   Ultrasound, settings/location:      min []   Iontophoresis:  []   take home patch w/ dexamethazone    min                                []   in clinic w/ dexamethazone    min []   Ice     []   Heat     position:     min []   Other:      Therapeutic Exercise: [x]  see flow sheet     []  Other:_ (minutes) :   Added/Changed Exercises:  []   Added:_  to improve (function):  []   Changed:_ to improve (function):    Therapeutic Activity:      (minutes) :     Neuromuscular re-ed:      (minutes) :       Manual Therapy:       (minutes) :       Gait Training: []  _ feet w/ _ device on level surface with _ level of assist  []   Other:_       (minutes) :     Patient Education: [x]  Review HEP    []  Progressed/Changed HEP based on:_   []  positioning   []  body mechanics   []  transfers   []  heat/ice application   (minutes) :8    Other Objective/Functional Measures:   Pain Level (0-10 scale) post treatment: 7    ASSESSMENT  [x]   See Plan of Care  []   See progress note/recertification  [x]   Patient will continue to benefit from skilled therapy to address  remaining functional deficits: per eval    Progress towards goals / Updated goals:  Per eval    PLAN  []   Upgrade activities as  tolerated     [x]   Continue plan of care  []   Discharge due to:_  []  Other:_      Kendal Hymen, PT 02/09/2013  12:32 PM

## 2013-02-13 NOTE — Progress Notes (Addendum)
PT DAILY TREATMENT NOTE     Patient Name: Laura Roman  Date:02/13/2013  DOB: [birth year]  [x]   Patient DOB Verified  Payor: BLUE CROSS  [place]: [facility]  Product Type: PPO     In time:902  Out time:955  Total Treatment Time (min): 53  Visit #: 2 of 12    Treatment Area: Pain in joint, shoulder region [719.41]    SUBJECTIVE  Pain Level (0-10 scale): 7  Any medication changes, allergies to medications, adverse drug reactions, diagnosis change, or new procedure performed?: [x]  No    []  Yes (see summary sheet for update)  Subjective functional status/changes:   []  No changes reported   limited HEP compliance    OBJECTIVE  Modality rationale:   Pain control    min []  Estim, type:                                          []   att     []   unatt     []   w/US     []   w/ice    []   w/heat    min []   Mechanical Traction: type/lbs                                               []   pro   []   sup   []   int   []   cont    min []   Ultrasound, settings/location:      min []   Iontophoresis:  []   take home patch w/ dexamethazone    min                                []   in clinic w/ dexamethazone   10 min [x]   Ice     []   Heat     position: seated    min []   Other:      Therapeutic Exercise: [x]  see flow sheet     []  Other:_ (minutes) : 33  Added/Changed Exercises:  []   Added:_  to improve (function):  []   Changed:_ to improve (function):    Therapeutic Activity:      (minutes) :     Neuromuscular re-ed:      (minutes) :       Manual Therapy: GHJ mobs, ST mobs, PROM R shoulder      (minutes) : 10      Gait Training: []  _ feet w/ _ device on level surface with _ level of assist  []   Other:_       (minutes) :     Patient Education: [x]  Review HEP    []  Progressed/Changed HEP based on:_   []  positioning   []  body mechanics   []  transfers   []  heat/ice application   (minutes) :    Other Objective/Functional Measures:   Pain Level (0-10 scale) post treatment: 7    ASSESSMENT  []   See Plan of Care  []   See progress note/recertification   [x]   Patient will continue to benefit from skilled therapy to address remaining functional deficits: per eval    Progress towards goals / Updated goals: patient with fair tolerance  to treatment, resistive to PROM.   Short Term Goals: To be accomplished in 1-2 weeks:   1. Patient will be I with HEP to improve outcomes    2. Improve R shoulder PROM flexion/scaption by 10 degrees for improved ability for ADLs.   Long Term Goals: To be accomplished in 4 weeks:   1. Improve Quick Dash score by at least 16% for increased ability for functional activities.   2. Increase R shoulder PROM flexion to measure 130 degrees for increased ability for activity.   3. Increase R shoulder PROM IR to measure 60 degrees for increased ability for activity.   4. Increase R shoulder PROM ER to measure 40 degrees for increased ability for activity.    PLAN  []   Upgrade activities as tolerated     [x]   Continue plan of care  []   Discharge due to:_  []  Other:_      Kendal Hymen, PT 02/13/2013  9:11 AM

## 2013-02-15 ENCOUNTER — Ambulatory Visit (HOSPITAL_BASED_OUTPATIENT_CLINIC_OR_DEPARTMENT_OTHER): Payer: BC Managed Care – PPO | Attending: Emergency Medicine | Admitting: Radiology

## 2013-02-15 VITALS — Ht 66.0 in | Wt 154.0 lb

## 2013-02-15 DIAGNOSIS — G4730 Sleep apnea, unspecified: Secondary | ICD-10-CM

## 2013-02-15 DIAGNOSIS — G4733 Obstructive sleep apnea (adult) (pediatric): Secondary | ICD-10-CM | POA: Insufficient documentation

## 2013-02-15 NOTE — Progress Notes (Signed)
PT DAILY TREATMENT NOTE     Patient Name: Lavender Stanke Custer  Date:02/15/2013  DOB: Sep 02, 1951  [x]   Patient DOB Verified  Payor: BLUE CROSS  [place]: [facility]  Product Type: PPO     In time:1006  Out time:1103  Total Treatment Time (min): 57  Visit #:3 of 12    Treatment Area: Pain in joint, shoulder region [719.41]    SUBJECTIVE  Pain Level (0-10 scale): 7-8  Any medication changes, allergies to medications, adverse drug reactions, diagnosis change, or new procedure performed?: [x]  No    []  Yes (see summary sheet for update)  Subjective functional status/changes:   [x]  No changes reported    OBJECTIVE  Modality rationale:   Pain control   10 min [x]  Estim, type: IFC                                         []   att     [x]   unatt     []   w/US     [x]   w/ice    []   w/heat    min []   Mechanical Traction: type/lbs                                               []   pro   []   sup   []   int   []   cont    min []   Ultrasound, settings/location:      min []   Iontophoresis:  []   take home patch w/ dexamethazone    min                                []   in clinic w/ dexamethazone    min []   Ice     []   Heat     position:     min []   Other:      Therapeutic Exercise: [x]  see flow sheet     []  Other:_ (minutes) : 32  Added/Changed Exercises:  []   Added:_  to improve (function):  []   Changed:_ to improve (function):    Therapeutic Activity:      (minutes) :     Neuromuscular re-ed:      (minutes) :       Manual Therapy: GHJ mobs, ST mobs, PROM R shoulder      (minutes) : 15      Gait Training: []  _ feet w/ _ device on level surface with _ level of assist  []   Other:_       (minutes) :     Patient Education: [x]  Review HEP    []  Progressed/Changed HEP based on:_   []  positioning   []  body mechanics   []  transfers   []  heat/ice application   (minutes) :    Other Objective/Functional Measures:   Pain Level (0-10 scale) post treatment:6- 7    ASSESSMENT  []   See Plan of Care  []   See progress note/recertification  [x]   Patient will  continue to benefit from skilled therapy to address remaining functional deficits: per eval    Progress towards goals / Updated goals: patient with fair tolerance to treatment, resistive to PROM.  Short Term Goals: To be accomplished in 1-2 weeks:   1. Patient will be I with HEP to improve outcomes    2. Improve R shoulder PROM flexion/scaption by 10 degrees for improved ability for ADLs.   Long Term Goals: To be accomplished in 4 weeks:   1. Improve Quick Dash score by at least 16% for increased ability for functional activities.   2. Increase R shoulder PROM flexion to measure 130 degrees for increased ability for activity.   3. Increase R shoulder PROM IR to measure 60 degrees for increased ability for activity.   4. Increase R shoulder PROM ER to measure 40 degrees for increased ability for activity.    PLAN  []   Upgrade activities as tolerated     [x]   Continue plan of care  []   Discharge due to:_  []  Other:_      Kendal Hymen, PT 02/15/2013  10:11 AM

## 2013-02-17 ENCOUNTER — Other Ambulatory Visit: Payer: Self-pay | Admitting: *Deleted

## 2013-02-17 NOTE — Progress Notes (Signed)
PT DAILY TREATMENT NOTE     Patient Name: Laura Roman  Date:02/17/2013  DOB: Oct 09, 1951  [x]   Patient DOB Verified  Payor: BLUE CROSS  [facility]  Product Type: PPO     In time:335  Out time: 434  Total Treatment Time (min): 59  Visit #:4 of 12    Treatment Area: Pain in joint, shoulder region [719.41]    SUBJECTIVE  Pain Level (0-10 scale): 6  Any medication changes, allergies to medications, adverse drug reactions, diagnosis change, or new procedure performed?: [x]  No    []  Yes (see summary sheet for update)  Subjective functional status/changes:   [x]  No changes reported    OBJECTIVE  Modality rationale:   Pain control   10 min [x]  Estim, type: IFC                                         []   att     [x]   unatt     []   w/US     [x]   w/ice    []   w/heat    min []   Mechanical Traction: type/lbs                                               []   pro   []   sup   []   int   []   cont    min []   Ultrasound, settings/location:      min []   Iontophoresis:  []   take home patch w/ dexamethazone    min                                []   in clinic w/ dexamethazone    min []   Ice     []   Heat     position:     min []   Other:      Therapeutic Exercise: [x]  see flow sheet     []  Other:_ (minutes) : 33  Added/Changed Exercises:  [x]   Added: pulleys_  to improve (function):ROM  []   Changed:_ to improve (function):    Therapeutic Activity:      (minutes) :     Neuromuscular re-ed:      (minutes) :       Manual Therapy: GHJ mobs, ST mobs, PROM R shoulder      (minutes) : 16      Gait Training: []  _ feet w/ _ device on level surface with _ level of assist  []   Other:_       (minutes) :     Patient Education: [x]  Review HEP    []  Progressed/Changed HEP based on:_   []  positioning   []  body mechanics   []  transfers   []  heat/ice application   (minutes) :    Other Objective/Functional Measures:   Pain Level (0-10 scale) post treatment: 6    ASSESSMENT  []   See Plan of Care  []   See progress note/recertification  [x]    Patient will continue to benefit from skilled therapy to address remaining functional deficits: per eval    Progress towards goals / Updated goals: PROM improving but slowly.    Short  Term Goals: To be accomplished in 1-2 weeks:   1. Patient will be I with HEP to improve outcomes    2. Improve R shoulder PROM flexion/scaption by 10 degrees for improved ability for ADLs.   Long Term Goals: To be accomplished in 4 weeks:   1. Improve Quick Dash score by at least 16% for increased ability for functional activities.   2. Increase R shoulder PROM flexion to measure 130 degrees for increased ability for activity.   3. Increase R shoulder PROM IR to measure 60 degrees for increased ability for activity.   4. Increase R shoulder PROM ER to measure 40 degrees for increased ability for activity.    PLAN  []   Upgrade activities as tolerated     [x]   Continue plan of care  []   Discharge due to:_  []  Other:_      Kendal Hymen, PT 02/17/2013  10:11 AM

## 2013-02-20 ENCOUNTER — Other Ambulatory Visit: Payer: Self-pay | Admitting: *Deleted

## 2013-02-20 MED ORDER — DILTIAZEM HCL ER COATED BEADS 240 MG PO CP24: 240.0000 mg | ORAL_CAPSULE | Freq: Every day | ORAL | Status: DC

## 2013-02-20 NOTE — Telephone Encounter (Signed)
Fax Received. Refill Completed. Mia [name] (R.M.A)   

## 2013-02-22 NOTE — Progress Notes (Signed)
PT DAILY TREATMENT NOTE     Patient Name: Laura Roman  Date:02/22/2013  DOB: 13-Jul-1951  [x]   Patient DOB Verified  Payor: BLUE CROSS  Plan: [facility]  Product Type: PPO     In time:427  Out time: 526  Total Treatment Time (min): 59  Visit #:5 of 12    Treatment Area: Pain in joint, shoulder region [719.41]    SUBJECTIVE  Pain Level (0-10 scale): 6  Any medication changes, allergies to medications, adverse drug reactions, diagnosis change, or new procedure performed?: [x]  No    []  Yes (see summary sheet for update)  Subjective functional status/changes:   [x]  No changes reported    OBJECTIVE  Modality rationale:   Pain control   10 min [x]  Estim, type: IFC                                         []   att     [x]   unatt     []   w/US     [x]   w/ice    []   w/heat    min []   Mechanical Traction: type/lbs                                               []   pro   []   sup   []   int   []   cont    min []   Ultrasound, settings/location:      min []   Iontophoresis:  []   take home patch w/ dexamethazone    min                                []   in clinic w/ dexamethazone    min []   Ice     []   Heat     position:     min []   Other:      Therapeutic Exercise: [x]  see flow sheet     []  Other:_ (minutes) : 35  Added/Changed Exercises:  [x]   Added: pulleys_  to improve (function):ROM  []   Changed:_ to improve (function):    Therapeutic Activity:      (minutes) :     Neuromuscular re-ed:      (minutes) :       Manual Therapy: GHJ mobs, ST mobs, PROM R shoulder      (minutes) : 14      Gait Training: []  _ feet w/ _ device on level surface with _ level of assist  []   Other:_       (minutes) :     Patient Education: [x]  Review HEP    []  Progressed/Changed HEP based on:_   []  positioning   []  body mechanics   []  transfers   []  heat/ice application   (minutes) :    Other Objective/Functional Measures: PROM flexion 120 degrees, ER 16 degrees, IR 42 degrees  Pain Level (0-10 scale) post treatment: 6    ASSESSMENT  []   See Plan of  Care  []   See progress note/recertification  [x]   Patient will continue to benefit from skilled therapy to address remaining functional deficits: per eval    Progress towards goals / Updated  goals: PROM improving but slowly.    Short Term Goals: To be accomplished in 1-2 weeks:   1. Patient will be I with HEP to improve outcomes - progressing    2. Improve R shoulder PROM flexion/scaption by 10 degrees for improved ability for ADLs. - MET  Long Term Goals: To be accomplished in 4 weeks:   1. Improve Quick Dash score by at least 16% for increased ability for functional activities.   2. Increase R shoulder PROM flexion to measure 130 degrees for increased ability for activity.-progressing   3. Increase R shoulder PROM IR to measure 60 degrees for increased ability for activity. - progressing  4. Increase R shoulder PROM ER to measure 40 degrees for increased ability for activity. - slow progress    PLAN  []   Upgrade activities as tolerated     [x]   Continue plan of care  []   Discharge due to:_  []  Other:_      Kendal Hymen, PT 02/22/2013  4:36 PM

## 2013-02-24 NOTE — Progress Notes (Signed)
PT DAILY TREATMENT NOTE     Patient Name: Shaina Gullatt Shapley  Date:02/24/2013  DOB: 1951-04-14  [x]   Patient DOB Verified  Payor: BLUE CROSS  [place]: [facility]  Product Type: PPO     In time:402  Out time: 500  Total Treatment Time (min): 58  Visit #:6 of 12    Treatment Area: Pain in joint, shoulder region [719.41]    SUBJECTIVE  Pain Level (0-10 scale): 6  Any medication changes, allergies to medications, adverse drug reactions, diagnosis change, or new procedure performed?: [x]  No    []  Yes (see summary sheet for update)  Subjective functional status/changes:   [x]  No changes reported    OBJECTIVE  Modality rationale:   Pain control   10 min [x]  Estim, type: IFC                                         []   att     [x]   unatt     []   w/US     [x]   w/ice    []   w/heat    min []   Mechanical Traction: type/lbs                                               []   pro   []   sup   []   int   []   cont    min []   Ultrasound, settings/location:      min []   Iontophoresis:  []   take home patch w/ dexamethazone    min                                []   in clinic w/ dexamethazone    min []   Ice     []   Heat     position:     min []   Other:      Therapeutic Exercise: [x]  see flow sheet     []  Other:_ (minutes) : 35  Added/Changed Exercises:  [x]   Added: pulleys_  to improve (function):ROM  []   Changed:_ to improve (function):    Therapeutic Activity:      (minutes) :     Neuromuscular re-ed:      (minutes) :       Manual Therapy: GHJ mobs, ST mobs, PROM R shoulder      (minutes) : 13      Gait Training: []  _ feet w/ _ device on level surface with _ level of assist  []   Other:_       (minutes) :     Patient Education: [x]  Review HEP    []  Progressed/Changed HEP based on:_   []  positioning   []  body mechanics   []  transfers   []  heat/ice application   (minutes) :    Other Objective/Functional Measures:   Pain Level (0-10 scale) post treatment: 6    ASSESSMENT  []   See Plan of Care  []   See progress note/recertification  [x]   Patient  will continue to benefit from skilled therapy to address remaining functional deficits: per eval    Progress towards goals / Updated goals: slow progress.    Short Term Goals:  To be accomplished in 1-2 weeks:   1. Patient will be I with HEP to improve outcomes - progressing    2. Improve R shoulder PROM flexion/scaption by 10 degrees for improved ability for ADLs. - MET  Long Term Goals: To be accomplished in 4 weeks:   1. Improve Quick Dash score by at least 16% for increased ability for functional activities.   2. Increase R shoulder PROM flexion to measure 130 degrees for increased ability for activity.-progressing   3. Increase R shoulder PROM IR to measure 60 degrees for increased ability for activity. - progressing  4. Increase R shoulder PROM ER to measure 40 degrees for increased ability for activity. - slow progress    PLAN  []   Upgrade activities as tolerated     [x]   Continue plan of care  []   Discharge due to:_  []  Other:_      Kendal Hymen, PT 02/24/2013  4:36 PM

## 2013-02-27 NOTE — Progress Notes (Signed)
PT DAILY TREATMENT NOTE     Patient Name: Laura Roman  Date:02/27/2013  DOB: [birth year]  [x]   Patient DOB Verified  Payor: BLUE CROSS  [place]: [facility]  Product Type: PPO     In time:920  Out time: 1015  Total Treatment Time (min): 55  Visit #:7of 12    Treatment Area: Pain in joint, shoulder region [719.41]    SUBJECTIVE  Pain Level (0-10 scale): 7  Any medication changes, allergies to medications, adverse drug reactions, diagnosis change, or new procedure performed?: [x]  No    []  Yes (see summary sheet for update)  Subjective functional status/changes:   [x]  No changes reported    OBJECTIVE  Modality rationale:   Pain control   10 min [x]  Estim, type: IFC                                         []   att     [x]   unatt     []   w/US     [x]   w/ice    []   w/heat    min []   Mechanical Traction: type/lbs                                               []   pro   []   sup   []   int   []   cont    min []   Ultrasound, settings/location:      min []   Iontophoresis:  []   take home patch w/ dexamethazone    min                                []   in clinic w/ dexamethazone    min []   Ice     []   Heat     position:     min []   Other:      Therapeutic Exercise: [x]  see flow sheet     []  Other:_ (minutes) : 33  Added/Changed Exercises:  []   Added: _  to improve (function):  []   Changed:_ to improve (function):    Therapeutic Activity:      (minutes) :     Neuromuscular re-ed:      (minutes) :       Manual Therapy: GHJ mobs, ST mobs, PROM R shoulder      (minutes) : 13      Gait Training: []  _ feet w/ _ device on level surface with _ level of assist  []   Other:_       (minutes) :     Patient Education: [x]  Review HEP    []  Progressed/Changed HEP based on:_   []  positioning   []  body mechanics   []  transfers   []  heat/ice application   (minutes) :    Other Objective/Functional Measures:   Pain Level (0-10 scale) post treatment: 6    ASSESSMENT  []   See Plan of Care  []   See progress note/recertification  [x]   Patient will  continue to benefit from skilled therapy to address remaining functional deficits: per eval    Progress towards goals / Updated goals: slow progress.    Short Term Goals: To  be accomplished in 1-2 weeks:   1. Patient will be I with HEP to improve outcomes - progressing    2. Improve R shoulder PROM flexion/scaption by 10 degrees for improved ability for ADLs. - MET  Long Term Goals: To be accomplished in 4 weeks:   1. Improve Quick Dash score by at least 16% for increased ability for functional activities.   2. Increase R shoulder PROM flexion to measure 130 degrees for increased ability for activity.-progressing   3. Increase R shoulder PROM IR to measure 60 degrees for increased ability for activity. - progressing  4. Increase R shoulder PROM ER to measure 40 degrees for increased ability for activity. - slow progress    PLAN  []   Upgrade activities as tolerated     [x]   Continue plan of care  []   Discharge due to:_  []  Other:_      Kendal Hymen, PT 02/27/2013  10:40 AM

## 2013-03-02 DIAGNOSIS — G4733 Obstructive sleep apnea (adult) (pediatric): Secondary | ICD-10-CM

## 2013-03-02 NOTE — Procedures (Signed)
NAME:  Mia [name], Mia [name] NO.:  [id number]  MEDICAL RECORD NO.:  [id number]          PATIENT TYPE:  OUT  LOCATION:  SLEEP CENTER                 FACILITY:  [facility]  PHYSICIAN:  Coralyn Helling, MD        DATE OF BIRTH:  [birth year]  DATE OF STUDY:  02/15/2013                           NOCTURNAL POLYSOMNOGRAM  REFERRING PHYSICIAN:  Leslye Peer, MD  INDICATION FOR STUDY:  Ms. Robben is a 62 year old female who has a history of hypertension.  She also has a history of obstructive sleep apnea after having a sleep study on Feb 27, 2009, showing an apnea/hypopnea index of 13.9.  She is referred back to sleep lab for a CPAP titration study.  Height is 5 feet 6 inches, weight is 154 pounds, BMI is 25, neck size is 13.5 inches.  EPWORTH SLEEPINESS SCORE:  7.  MEDICATIONS:  Diltiazem, venlafaxine, Imuran, donepezil, furosemide, pantoprazole, metformin, Flonase, methocarbamol, Zyrtec, sumatriptan, and methotrexate.  SLEEP ARCHITECTURE:  Total recording time is 357 minutes.  Total sleep time is 93 minutes.  Sleep efficiency was 26%.  Sleep latency was 32 minutes.  This study was notable for lack of slow-wave sleep and REM sleep and she slept in the nonsupine position.  RESPIRATORY DATA:  The average respiratory rate was 16.  The patient was started on CPAP of 5 and increased to 9 cm of water.  She had very minimal sleep time while using CPAP.  As a result, it was difficult to determine optimal pressure settings for her.  OXYGEN DATA:  The baseline oxygenation was 98%.  The oxygen saturation nadir was 95%.  The study was conducted without the use of supplemental oxygen.  CARDIAC DATA:  The average heart rate was 60 and the rhythm strip showed sinus rhythm.  MOVEMENT-PARASOMNIA:  The periodic limb movement index was 7.7 and the patient had 2 restroom trips.  IMPRESSIONS-RECOMMENDATIONS:  This was a suboptimal titration study. The patient had difficulties with sleep  initiation and sleep maintenance.  As a result, it was difficult to determine optimal pressure settings for her CPAP.  At this point, options could include to have the patient return to the sleep lab with the use of a sleep aid to ensure adequate sleep time. Alternatively, she could be tried on auto CPAP setting.  If she is unable to tolerate the CPAP therapy, then additional therapeutic interventions could include oral appliance or surgical intervention.  Please note that the patient did not require the use of supplemental oxygen during the study.     Coralyn Helling, MD Diplomat, American Board of Sleep Medicine    VS/MEDQ  D:  03/02/2013 08:09:12  T:  03/02/2013 23:48:42  Job:  [id number]

## 2013-03-03 NOTE — Progress Notes (Signed)
PT DAILY TREATMENT NOTE     Patient Name: Laura Roman  Date:03/03/2013  DOB: Mar 21, 1951  [x]   Patient DOB Verified  Payor: BLUE CROSS  [facility]: [facility]  Product Type: PPO     In time:835 Out time: 936  Total Treatment Time (min): 58  Visit #:8 of 12    Treatment Area: Pain in joint, shoulder region [719.41]    SUBJECTIVE  Pain Level (0-10 scale): 6  Any medication changes, allergies to medications, adverse drug reactions, diagnosis change, or new procedure performed?: [x]  No    []  Yes (see summary sheet for update)  Subjective functional status/changes:   [x]  No changes reported    OBJECTIVE  Modality rationale:   Pain control   10 min [x]  Estim, type: IFC                                         []   att     [x]   unatt     []   w/US     [x]   w/ice    []   w/heat    min []   Mechanical Traction: type/lbs                                               []   pro   []   sup   []   int   []   cont    min []   Ultrasound, settings/location:      min []   Iontophoresis:  []   take home patch w/ dexamethazone    min                                []   in clinic w/ dexamethazone    min []   Ice     []   Heat     position:     min []   Other:      Therapeutic Exercise: [x]  see flow sheet     []  Other:_ (minutes) : 33  Added/Changed Exercises:  []   Added: _  to improve (function):  []   Changed:_ to improve (function):    Therapeutic Activity:      (minutes) :     Neuromuscular re-ed:      (minutes) :       Manual Therapy: GHJ mobs, ST mobs, PROM R shoulder      (minutes) : 15      Gait Training: []  _ feet w/ _ device on level surface with _ level of assist  []   Other:_       (minutes) :     Patient Education: [x]  Review HEP    []  Progressed/Changed HEP based on:_   []  positioning   []  body mechanics   []  transfers   []  heat/ice application   (minutes) :    Other Objective/Functional Measures:   Pain Level (0-10 scale) post treatment: 3-4    ASSESSMENT  []   See Plan of Care  []   See progress note/recertification  [x]   Patient will  continue to benefit from skilled therapy to address remaining functional deficits: per eval    Progress towards goals / Updated goals: slow progress.    Short Term Goals: To  be accomplished in 1-2 weeks:   1. Patient will be I with HEP to improve outcomes - MET    2. Improve R shoulder PROM flexion/scaption by 10 degrees for improved ability for ADLs. - MET  Long Term Goals: To be accomplished in 4 weeks:   1. Improve Quick Dash score by at least 16% for increased ability for functional activities.   2. Increase R shoulder PROM flexion to measure 130 degrees for increased ability for activity.-progressing   3. Increase R shoulder PROM IR to measure 60 degrees for increased ability for activity. - progressing  4. Increase R shoulder PROM ER to measure 40 degrees for increased ability for activity. - slow progress    PLAN  []   Upgrade activities as tolerated     [x]   Continue plan of care  []   Discharge due to:_  []  Other:_      Kendal Hymen, PT 03/03/2013  10:40 AM

## 2013-03-06 NOTE — Progress Notes (Signed)
PT DAILY TREATMENT NOTE     Patient Name: Laura Roman  Date:03/06/2013  DOB: Jun 16, 1951  [x]   Patient DOB Verified  Payor: BLUE CROSS  Plan: [facility]  Product Type: PPO     In time:910 Out time: 1013  Total Treatment Time (min): 63  Visit #:9 of 12    Treatment Area: Pain in joint, shoulder region [719.41]    SUBJECTIVE  Pain Level (0-10 scale): 6  Any medication changes, allergies to medications, adverse drug reactions, diagnosis change, or new procedure performed?: [x]  No    []  Yes (see summary sheet for update)  Subjective functional status/changes:   []  No changes reported  Patient reports minimal HEP compliance.   OBJECTIVE  Modality rationale:   Pain control   10 min [x]  Estim, type: IFC                                         []   att     [x]   unatt     []   w/US     [x]   w/ice    []   w/heat    min []   Mechanical Traction: type/lbs                                               []   pro   []   sup   []   int   []   cont    min []   Ultrasound, settings/location:      min []   Iontophoresis:  []   take home patch w/ dexamethazone    min                                []   in clinic w/ dexamethazone    min []   Ice     []   Heat     position:     min []   Other:      Therapeutic Exercise: [x]  see flow sheet     []  Other:_ (minutes) : 35  Added/Changed Exercises:  []   Added: _  to improve (function):  []   Changed:_ to improve (function):    Therapeutic Activity:      (minutes) :     Neuromuscular re-ed:      (minutes) :       Manual Therapy: GHJ mobs, ST mobs, PROM R shoulder      (minutes) : 18      Gait Training: []  _ feet w/ _ device on level surface with _ level of assist  []   Other:_       (minutes) :     Patient Education: [x]  Review HEP    []  Progressed/Changed HEP based on:_   []  positioning   []  body mechanics   []  transfers   []  heat/ice application   (minutes) :    Other Objective/Functional Measures:   Pain Level (0-10 scale) post treatment: 3    ASSESSMENT  []   See Plan of Care  []   See progress  note/recertification  [x]   Patient will continue to benefit from skilled therapy to address remaining functional deficits: per eval    Progress towards goals / Updated goals: slow progress, need to  improve HEP compiance    Short Term Goals: To be accomplished in 1-2 weeks:   1. Patient will be I with HEP to improve outcomes - MET    2. Improve R shoulder PROM flexion/scaption by 10 degrees for improved ability for ADLs. - MET  Long Term Goals: To be accomplished in 4 weeks:   1. Improve Quick Dash score by at least 16% for increased ability for functional activities.   2. Increase R shoulder PROM flexion to measure 130 degrees for increased ability for activity.-progressing   3. Increase R shoulder PROM IR to measure 60 degrees for increased ability for activity. - progressing  4. Increase R shoulder PROM ER to measure 40 degrees for increased ability for activity. - slow progress    PLAN  []   Upgrade activities as tolerated     [x]   Continue plan of care  []   Discharge due to:_  []  Other:_      Kendal Hymen, PT 03/06/2013  10:40 AM

## 2013-03-08 NOTE — Progress Notes (Signed)
PT DAILY TREATMENT NOTE     Patient Name: Laura Roman  Date:03/08/2013  DOB: 10-Jun-1951  [x]   Patient DOB Verified  Payor: BLUE CROSS  Plan: [facility]  Product Type: PPO     In time:502 Out time: 558  Total Treatment Time (min): 56  Visit #:10 of 12    Treatment Area: Pain in joint, shoulder region [719.41]    SUBJECTIVE  Pain Level (0-10 scale): 5  Any medication changes, allergies to medications, adverse drug reactions, diagnosis change, or new procedure performed?: [x]  No    []  Yes (see summary sheet for update)  Subjective functional status/changes:   [x]  No changes reported       OBJECTIVE  Modality rationale:   Pain control   10 min [x]  Estim, type: IFC                                         []   att     [x]   unatt     []   w/US     [x]   w/ice    []   w/heat    min []   Mechanical Traction: type/lbs                                               []   pro   []   sup   []   int   []   cont    min []   Ultrasound, settings/location:      min []   Iontophoresis:  []   take home patch w/ dexamethazone    min                                []   in clinic w/ dexamethazone    min []   Ice     []   Heat     position:     min []   Other:      Therapeutic Exercise: [x]  see flow sheet     []  Other:_ (minutes) : 31  Added/Changed Exercises:  []   Added: _  to improve (function):  []   Changed:_ to improve (function):    Therapeutic Activity:      (minutes) :     Neuromuscular re-ed:      (minutes) :       Manual Therapy: GHJ mobs, ST mobs, PROM R shoulder      (minutes) : 15      Gait Training: []  _ feet w/ _ device on level surface with _ level of assist  []   Other:_       (minutes) :     Patient Education: [x]  Review HEP    []  Progressed/Changed HEP based on:_   []  positioning   []  body mechanics   []  transfers   []  heat/ice application   (minutes) :    Other Objective/Functional Measures:   Pain Level (0-10 scale) post treatment: 3    ASSESSMENT  []   See Plan of Care  []   See progress note/recertification  [x]   Patient will  continue to benefit from skilled therapy to address remaining functional deficits: per eval    Progress towards goals / Updated goals: slow progress, pt resists PROM.  Short Term Goals: To be accomplished in 1-2 weeks:   1. Patient will be I with HEP to improve outcomes - MET    2. Improve R shoulder PROM flexion/scaption by 10 degrees for improved ability for ADLs. - MET  Long Term Goals: To be accomplished in 4 weeks:   1. Improve Quick Dash score by at least 16% for increased ability for functional activities.   2. Increase R shoulder PROM flexion to measure 130 degrees for increased ability for activity.-progressing   3. Increase R shoulder PROM IR to measure 60 degrees for increased ability for activity. - progressing  4. Increase R shoulder PROM ER to measure 40 degrees for increased ability for activity. - slow progress    PLAN  []   Upgrade activities as tolerated     [x]   Continue plan of care  []   Discharge due to:_  []  Other:_      Kendal Hymen, PT 03/08/2013  10:40 AM

## 2013-03-14 LAB — HEMOGLOBIN A1C WITH EAG: Hemoglobin A1c: 6.2 % — ABNORMAL HIGH (ref 4.8–5.6)

## 2013-03-14 LAB — LIPID PANEL
Cholesterol, total: 152 mg/dL (ref 100–199)
HDL Cholesterol: 39 mg/dL — ABNORMAL LOW (ref 40–78)
LDL, calculated: 82 mg/dL (calc) (ref 0–160)
Triglyceride: 157 mg/dL — ABNORMAL HIGH (ref 0–149)
VLDL, calculated: 31 mg/dL (calc)

## 2013-03-14 LAB — TSH 3RD GENERATION: TSH: 2.23 u[IU]/mL (ref 0.450–4.500)

## 2013-03-14 LAB — CVD REPORT: PDF IMAGE: 0

## 2013-03-15 NOTE — Progress Notes (Signed)
PT DAILY TREATMENT NOTE     Patient Name: Laura Roman  Date:03/15/2013  DOB: Mar 06, 1951  [x]   Patient DOB Verified  Payor: BLUE CROSS  Plan: [facility]  Product Type: PPO     In time: 328 Out time: 441   Total Treatment Time (min): 73  Visit #:11 of 12    Treatment Area: Pain in joint, shoulder region [719.41]    SUBJECTIVE  Pain Level (0-10 scale): 6  Any medication changes, allergies to medications, adverse drug reactions, diagnosis change, or new procedure performed?: [x]  No    []  Yes (see summary sheet for update)  Subjective functional status/changes:   [x]  No changes reported       OBJECTIVE  Modality rationale:   Pain control   10 min [x]  Estim, type: IFC                                         []   att     [x]   unatt     []   w/US     [x]   w/ice    []   w/heat    min []   Mechanical Traction: type/lbs                                               []   pro   []   sup   []   int   []   cont    min []   Ultrasound, settings/location:      min []   Iontophoresis:  []   take home patch w/ dexamethazone    min                                []   in clinic w/ dexamethazone    min []   Ice     []   Heat     position:     min []   Other:      Therapeutic Exercise: [x]  see flow sheet     []  Other:_ (minutes) : 40  Added/Changed Exercises:  []   Added: _  to improve (function):  []   Changed:_ to improve (function):    Therapeutic Activity:      (minutes) :     Neuromuscular re-ed:      (minutes) :       Manual Therapy: GHJ mobs, ST mobs, PROM R shoulder      (minutes) : 20      Gait Training: []  _ feet w/ _ device on level surface with _ level of assist  []   Other:_       (minutes) :     Patient Education: [x]  Review HEP    []  Progressed/Changed HEP based on:_   []  positioning   []  body mechanics   []  transfers   []  heat/ice application   (minutes) :    Other Objective/Functional Measures:   Pain Level (0-10 scale) post treatment: 3    ASSESSMENT  []   See Plan of Care  []   See progress note/recertification  [x]   Patient will  continue to benefit from skilled therapy to address remaining functional deficits: per eval    Progress towards goals / Updated goals: slow progress, pt resists  PROM.   Short Term Goals: To be accomplished in 1-2 weeks:   1. Patient will be I with HEP to improve outcomes - MET    2. Improve R shoulder PROM flexion/scaption by 10 degrees for improved ability for ADLs. - MET  Long Term Goals: To be accomplished in 4 weeks:   1. Improve Quick Dash score by at least 16% for increased ability for functional activities.   2. Increase R shoulder PROM flexion to measure 130 degrees for increased ability for activity.-progressing   3. Increase R shoulder PROM IR to measure 60 degrees for increased ability for activity. - progressing  4. Increase R shoulder PROM ER to measure 40 degrees for increased ability for activity. - slow progress    PLAN  []   Upgrade activities as tolerated     [x]   Continue plan of care  []   Discharge due to:_  []  Other:_      Kendal Hymen, PT 03/15/2013  10:40 AM

## 2013-03-17 NOTE — Progress Notes (Signed)
PT DAILY TREATMENT NOTE     Patient Name: Laura Roman  Date:03/17/2013  DOB: 19-Feb-1951  [x]   Patient DOB Verified  Payor: BLUE CROSS  Plan: [facility]  Product Type: PPO     In time: 808 Out time: 907  Total Treatment Time (min): 59  Visit #:12 of 12    Treatment Area: Pain in joint, shoulder region [719.41]    SUBJECTIVE  Pain Level (0-10 scale): 6  Any medication changes, allergies to medications, adverse drug reactions, diagnosis change, or new procedure performed?: [x]  No    []  Yes (see summary sheet for update)  Subjective functional status/changes:   [x]  No changes reported       OBJECTIVE  Modality rationale:   Pain control   10 min [x]  Estim, type: IFC                                         []   att     [x]   unatt     []   w/US     [x]   w/ice    []   w/heat    min []   Mechanical Traction: type/lbs                                               []   pro   []   sup   []   int   []   cont    min []   Ultrasound, settings/location:      min []   Iontophoresis:  []   take home patch w/ dexamethazone    min                                []   in clinic w/ dexamethazone    min []   Ice     []   Heat     position:     min []   Other:      Therapeutic Exercise: [x]  see flow sheet     []  Other:_ (minutes) : 39  Added/Changed Exercises:  []   Added: _  to improve (function):  []   Changed:_ to improve (function):    Therapeutic Activity:      (minutes) :     Neuromuscular re-ed:      (minutes) :       Manual Therapy: GHJ mobs, ST mobs, PROM R shoulder      (minutes) :10      Gait Training: []  _ feet w/ _ device on level surface with _ level of assist  []   Other:_       (minutes) :     Patient Education: [x]  Review HEP    []  Progressed/Changed HEP based on:_   []  positioning   []  body mechanics   []  transfers   []  heat/ice application   (minutes) :    Other Objective/Functional Measures:   Pain Level (0-10 scale) post treatment: 5    ASSESSMENT  []   See Plan of Care  [x]   See progress note/recertification  [x]   Patient will  continue to benefit from skilled therapy to address remaining functional deficits: per eval    Progress towards goals / Updated goals: slow progress, pt resists PROM.  Short Term Goals: To be accomplished in 1-2 weeks:   1. Patient will be I with HEP to improve outcomes - MET    2. Improve R shoulder PROM flexion/scaption by 10 degrees for improved ability for ADLs. - MET  Long Term Goals: To be accomplished in 4 weeks:   1. Improve Quick Dash score by at least 16% for increased ability for functional activities.   2. Increase R shoulder PROM flexion to measure 130 degrees for increased ability for activity.-progressing   3. Increase R shoulder PROM IR to measure 60 degrees for increased ability for activity. - progressing  4. Increase R shoulder PROM ER to measure 40 degrees for increased ability for activity. - slow progress    PLAN  []   Upgrade activities as tolerated     [x]   Continue plan of care  []   Discharge due to:_  []  Other:_      Kendal Hymen, PT 03/17/2013  10:40 AM

## 2013-03-17 NOTE — Progress Notes (Signed)
[facility] ??? [facility]  [address]  [place], [place] [address]  [phone]  [phone] fax    Physical Therapy Progress Note  Patient name: Laura Roman Referral source: Alisa Graff, MD   DOB: 1951/07/07 Diagnosis: Pain in joint, shoulder region [719.41]   Date of initial visit: 02-09-13    Visits from Start of Care: 12 Missed Visits: 1         Established Goals:        Excellent         Good         Limited            None  [x]  Increased ROM   []   [x]   [x]   []   [x]  Increased Strength  []   []   [x]   []   [x]  Increased Mobility  []   []   [x]   []    [x]  Decreased Pain   []   []   [x]   []   []  Decreased Swelling  []   []   []   []     Key Functional Changes: patient with slow progress with PROM with significant capsular restrictions. She will benefit from continuation of PT services to further PROM.     Updated Goals: to be achieved in 4-6 weeks:   1. Improve Quick Dash score by at least 16% for increased ability for functional activities.   2. Increase R shoulder PROM flexion to measure 130 degrees for increased ability for activity   3. Increase R shoulder PROM IR to measure 60 degrees for increased ability for activity.   4. Increase R shoulder PROM ER to measure 40 degrees for increased ability for activity.     ASSESSMENT/RECOMMENDATIONS:  [x] Continue therapy per initial plan/protocol at a frequency of  2-3 x per week for 4-6 weeks  [] Continue therapy with the following recommended changes:_____________________      _____________________________________________________________________  [] Discontinue therapy progressing towards or have reached established goals  [] Discontinue therapy due to lack of appreciable progress towards goals  [] Discontinue therapy due to lack of attendance or compliance  [] Await Physician's recommendations/decisions regarding therapy  [] Other:________________________________________________________________    Thank you for this referral.    Kendal Hymen, PT  03/17/2013 8:12 AM  NOTE TO PHYSICIAN:  PLEASE COMPLETE THE ORDERS BELOW AND   FAX TO [facility]: [phone]  If you are unable to process this request in 24 hours please contact our office: [phone]      I have read the above report and request that my patient continue as recommended.    I have read the above report and request that my patient continue therapy with the following changes/special instructions:__________________________________________________________    I have read the above report and request that my patient be discharged from therapy.    Physician???s signature: ______________________________Date: ______Time:______

## 2013-03-24 NOTE — Progress Notes (Signed)
Reviewed chart in preparation of office visit and have obtained necessary documentation.

## 2013-03-26 NOTE — Progress Notes (Signed)
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  No plans on surgery anytime soon though.      Denies any GI or GU complaints.    FBS <110, not checking pps.  Denies polyuria, polydipsia, nocturia, vision change.  She's done mush better w dietary choices although not much success w attempts at losing weight due to the issues above    Vitals 03/27/2013 09/19/2012 03/22/2012 03/13/2011 02/09/2011   Weight 163 164 156.5 159 158   Height 5\' 4"  5\' 4"  5\' 4"  5\' 4"  5\' 4"    BMI 27.97 28.14 26.85 27.28 27.11     Past Medical History   Diagnosis Date   ??? 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 Multiple lung nodules      no change 10/06, 03/07, 03/08   ??? Anxiety state, unspecified    ??? Palpitations 2008     neg thallium 2008, nl holter 2008, echo nl lv/ef 65%/tr mr/dd/nl pasp   ??? Prediabetes    ??? Vitamin D deficiency    ??? Dyslipidemia 03/22/2012   ??? Compression fx, thoracic spine 2007     negative DEXA Dr. Parks Ranger   ??? Arthritis      Dr. Paulina Fusi, Dr. Parks Ranger   ??? Frozen shoulder      right Dr. Luci Bank     Past Surgical History   Procedure Laterality Date   ??? Hx hemorrhoidectomy       Dr. Derrell Lolling 2007   ??? Endoscopy, colon, diagnostic  2007     negative Dr Derrell Lolling   ??? Echo 2d adult  12/04     normal with EF 70%   ??? Stress test thallium study  2005     negative   ??? Vas carotid duplex bilateral  2004     negative   ??? [place] abd comp  9/05     negative   ??? Hx gyn       s/p BTL      History     Social History   ??? Marital Status: MARRIED     Spouse Name: N/A     Number of Children: 2   ??? 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 Smoking status: Never Smoker    ??? Smokeless tobacco: Not on file   ??? Alcohol Use: No   ??? Drug Use: No   ??? Sexually Active: Not on file     Other Topics Concern   ??? Not on  file     Social History Narrative   ??? No narrative on file     Current Outpatient Prescriptions   Medication Sig   ??? atorvastatin (LIPITOR) 40 mg tablet Take 1 Tab by mouth daily.   ??? ergocalciferol (VITAMIN D2) 50,000 unit capsule Take 1 Cap by mouth every seven (7) days.   ??? citalopram (CELEXA) 10 mg tablet Take 1 Tab by mouth daily.     No current facility-administered medications for this visit.     Allergies   Allergen Reactions   ??? Fish Oil Nausea Only   ??? Relafen (Nabumetone) Nausea Only     REVIEW OF SYSTEMS: last gyn  and mammo 2012 Dr. Rhys Martini in [place]  Ophtho ??? no vision change or eye pain  Oral ??? no mouth pain, tongue or tooth problems  Ears ??? no hearing loss, ear pain, fullness, no swallowing problems  Cardiac ??? no CP, PND, orthopnea, edema, palpitations or syncope  Chest ??? no breast masses  Resp ??? no wheezing, chronic coughing, dyspnea  Urinary ??? no dysuria, hematuria, flank pain, urgency, frequency  Ortho ??? no swelling, dec ROM, myalgias  Psych ??? denies any anxiety or depression symptoms, no hallucinations or violent ideation  Endo - no polyuria, polydipsia, nocturia, hot flashes    Visit Vitals   Item Reading   ??? BP 122/78   ??? Pulse 76   ??? Temp 98.3 ??F (36.8 ??C)   ??? Ht 5\' 4"  (1.626 m)   ??? Wt 163 lb (73.936 kg)   ??? BMI 27.97 kg/m2     Affect is appropriate.  Mood stable  No apparent distress  HEENT --Anicteric sclerae, tympanic membranes normal,  ear canals normal.  Sinuses were nontender, turbinates normal, hearing normal.  Oropharynx without  erythema, normal tongue, oral mucosa and tonsils.  No thyromegaly, JVD, or bruits.    Lungs --Clear to auscultation and percussion, normal percussion.  Heart --Regular rate and rhythm, no murmurs, rubs, gallops, or clicks.  Abdomen -- Soft and nontender, no hepatosplenomegaly or masses.  Extremities -- Without cyanosis, clubbing, edema. 2+ pulses equally and bilaterally.    LABS  From 5/10 showed gluc 110, cr 0.70,               alt 23,                                     chol 154, tg 143, hdl 45, ldl-c 80,                                                        tsh 1.91  From 5/10 showed                    2 hr GTT 89  From 8/10 showed                                                             vit d 23.0, ck 57, aldo 5.4  From 8/11 showed gluc 108,                                      hba1c 6.3,                   chol 160, tg 172, hdl 39, ldl-c 87,  wbc 5.,7 hb 12.3, plt 235, ua neg, tsh 2.28  From 5/12 showed gluc 113, cr 0.71,  gfr 94,  alt 16, hba1c 6.2, ldl-p 1989, chol 177, tg 161, hdl 43, ldl-c 102  From 11/12 showed  hba1c 5.9, ldl-p 1218, chol 133, tg 116, hdl 45, ldl-c 65  From 5/13 showed gluc 105, cr 0.55, gfr 102, alt 8,   hba1c 6.2,                   chol 148, tg 107, hdl 45, ldl-c 82,  wbc 5.0, hb 12.5, plt 172, vit d 40.3  From 11/13 showed       hba1c 6.2, ldl-p 1888, chol 176, tg 155, hdl 49, ldl-c 86,  wbc 6.2, hb 12.3, plt 182, vit d 34.4    Results for orders placed in visit on 03/13/13   HEMOGLOBIN A1C       Result Value Range    Hemoglobin A1c 6.2 (*) 4.8 - 5.6 %   LIPID PANEL       Result Value Range    Cholesterol, total 152  100 - 199 mg/dL    Triglyceride 454 (*) 0 - 149 mg/dL    HDL Cholesterol 39 (*) 40 - 78 mg/dL    VLDL, calculated 31      LDL, calculated 82  0 - 160 mg/dL (calc)   TSH, 3RD GENERATION       Result Value Range    TSH 2.230  0.450 - 4.500 uIU/mL   CVD REPORT       Result Value Range    INTERPRETATION Note      PDF IMAGE .       Assessment and plan:  1. Prediabetes. Lifestyle and dietary modification reiterated. Weight loss would be ideal  2. Hyperlipidemia. Inc lipitor to 40  3. Hypovitaminosis D. Continue supp  4. Anxiety.  Continue celexa  5. Overweight.  Not interested in appetite suppressants at last visit  6. FSS.  F/U PT and Dr. Luci Bank        RTC 12/14

## 2013-03-27 ENCOUNTER — Other Ambulatory Visit: Payer: Self-pay | Admitting: Neurology

## 2013-03-27 NOTE — Progress Notes (Signed)
PT DAILY TREATMENT NOTE     Patient Name: Laura Roman  Date:03/27/2013  DOB: [birth year]  [x]   Patient DOB Verified  Payor: BLUE CROSS  [facility] [facility]  Product Type: PPO     In time: 810 Out time: 912  Total Treatment Time (min): 62  Visit #:1 of 12    Treatment Area: Pain in joint, shoulder region [719.41]    SUBJECTIVE  Pain Level (0-10 scale): 5  Any medication changes, allergies to medications, adverse drug reactions, diagnosis change, or new procedure performed?: [x]  No    []  Yes (see summary sheet for update)  Subjective functional status/changes:   [x]  No changes reported       OBJECTIVE  Modality rationale:   Pain control   10 min [x]  Estim, type: IFC                                         []   att     [x]   unatt     []   w/US     [x]   w/ice    []   w/heat    min []   Mechanical Traction: type/lbs                                               []   pro   []   sup   []   int   []   cont    min []   Ultrasound, settings/location:      min []   Iontophoresis:  []   take home patch w/ dexamethazone    min                                []   in clinic w/ dexamethazone   5 min []   Ice     [x]   Heat     position: seated    min []   Other:      Therapeutic Exercise: [x]  see flow sheet     []  Other:_ (minutes) : 38  Added/Changed Exercises:  []   Added: _  to improve (function):  []   Changed:_ to improve (function):    Therapeutic Activity:      (minutes) :     Neuromuscular re-ed:      (minutes) :       Manual Therapy: GHJ mobs, ST mobs, PROM R shoulder      (minutes) :9      Gait Training: []  _ feet w/ _ device on level surface with _ level of assist  []   Other:_       (minutes) :     Patient Education: [x]  Review HEP    []  Progressed/Changed HEP based on:_   []  positioning   []  body mechanics   []  transfers   []  heat/ice application   (minutes) :    Other Objective/Functional Measures:   Pain Level (0-10 scale) post treatment: 4-5    ASSESSMENT  []   See Plan of Care  []   See progress note/recertification  [x]   Patient  will continue to benefit from skilled therapy to address remaining functional deficits: pain, limited ROM, limited joint mobility.     Progress towards goals / Updated goals: patient  making slow progress with PROM  Short Term Goals: To be accomplished in 1-2 weeks:   1. Patient will be I with HEP to improve outcomes - MET    2. Improve R shoulder PROM flexion/scaption by 10 degrees for improved ability for ADLs. - MET  Long Term Goals: To be accomplished in 4 weeks:   1. Improve Quick Dash score by at least 16% for increased ability for functional activities.   2. Increase R shoulder PROM flexion to measure 130 degrees for increased ability for activity.-progressing   3. Increase R shoulder PROM IR to measure 60 degrees for increased ability for activity. - progressing  4. Increase R shoulder PROM ER to measure 40 degrees for increased ability for activity. - slow progress    PLAN  []   Upgrade activities as tolerated     [x]   Continue plan of care  []   Discharge due to:_  []  Other:_      Kendal Hymen, PT 03/27/2013  10:40 AM

## 2013-03-31 NOTE — Progress Notes (Signed)
PT DAILY TREATMENT NOTE     Patient Name: Kasia Trego Groesbeck  Date:03/31/2013  DOB: October 15, 1951  [x]   Patient DOB Verified  Payor: BLUE CROSS  Plan: [facility]  Product Type: PPO     In time: 805 Out time: 908  Total Treatment Time (min): 63  Visit #:2 of 12    Treatment Area: Pain in joint, shoulder region [719.41]    SUBJECTIVE  Pain Level (0-10 scale): 4  Any medication changes, allergies to medications, adverse drug reactions, diagnosis change, or new procedure performed?: [x]  No    []  Yes (see summary sheet for update)  Subjective functional status/changes:   []  No changes reported   Patient reports receiving injection from MD with significant increase in pain for several days following. She reports it has now decreased but she is not sure if it was of benefit. She was questioned about pulley/HEP compliance and she reports the pulleys are still in the box.     OBJECTIVE  Modality rationale:   Pain control   10 min [x]  Estim, type: IFC                                         []   att     [x]   unatt     []   w/US     [x]   w/ice    []   w/heat    min []   Mechanical Traction: type/lbs                                               []   pro   []   sup   []   int   []   cont    min []   Ultrasound, settings/location:      min []   Iontophoresis:  []   take home patch w/ dexamethazone    min                                []   in clinic w/ dexamethazone   5 min []   Ice     [x]   Heat     position: seated    min []   Other:      Therapeutic Exercise: [x]  see flow sheet     []  Other:_ (minutes) : 38  Added/Changed Exercises:  []   Added: _  to improve (function):  []   Changed:_ to improve (function):    Therapeutic Activity:      (minutes) :     Neuromuscular re-ed:      (minutes) :       Manual Therapy: GHJ mobs, ST mobs, PROM R shoulder      (minutes) :10      Gait Training: []  _ feet w/ _ device on level surface with _ level of assist  []   Other:_       (minutes) :     Patient Education: [x]  Review HEP    []  Progressed/Changed HEP  based on:_   []  positioning   []  body mechanics   []  transfers   []  heat/ice application   (minutes) :    Other Objective/Functional Measures:   Pain Level (0-10 scale) post treatment: 4  ASSESSMENT  []   See Plan of Care  []   See progress note/recertification  [x]   Patient will continue to benefit from skilled therapy to address remaining functional deficits: pain, limited ROM, limited joint mobility.     Progress towards goals / Updated goals: patient making slow progress with PROM  Short Term Goals: To be accomplished in 1-2 weeks:   1. Patient will be I with HEP to improve outcomes - ? Compliance but demonstrates independence.   2. Improve R shoulder PROM flexion/scaption by 10 degrees for improved ability for ADLs. - MET  Long Term Goals: To be accomplished in 4 weeks:   1. Improve Quick Dash score by at least 16% for increased ability for functional activities.   2. Increase R shoulder PROM flexion to measure 130 degrees for increased ability for activity.-progressing   3. Increase R shoulder PROM IR to measure 60 degrees for increased ability for activity. - progressing  4. Increase R shoulder PROM ER to measure 40 degrees for increased ability for activity. - slow progress    PLAN  []   Upgrade activities as tolerated     [x]   Continue plan of care  []   Discharge due to:_  []  Other:_      Kendal Hymen, PT 03/31/2013  10:40 AM

## 2013-04-04 NOTE — Progress Notes (Signed)
PT DAILY TREATMENT NOTE     Patient Name: Laura Roman  Date:04/04/2013  DOB: 10/04/1951  [x]   Patient DOB Verified  Payor: BLUE CROSS  Plan[place] [facility]  Product Type: PPO     In time: 905 Out time: 1005  Total Treatment Time (min): 60  Visit #:3 of 12    Treatment Area: Pain in joint, shoulder region [719.41]    SUBJECTIVE  Pain Level (0-10 scale): 4  Any medication changes, allergies to medications, adverse drug reactions, diagnosis change, or new procedure performed?: [x]  No    []  Yes (see summary sheet for update)  Subjective functional status/changes:   [x]  No changes reported      OBJECTIVE  Modality rationale:   Pain control   10 min [x]  Estim, type: IFC                                         []   att     [x]   unatt     []   w/US     [x]   w/ice    []   w/heat    min []   Mechanical Traction: type/lbs                                               []   pro   []   sup   []   int   []   cont    min []   Ultrasound, settings/location:      min []   Iontophoresis:  []   take home patch w/ dexamethazone    min                                []   in clinic w/ dexamethazone   5 min []   Ice     [x]   Heat     position: seated    min []   Other:      Therapeutic Exercise: [x]  see flow sheet     []  Other:_ (minutes) : 35  Added/Changed Exercises:  []   Added: _  to improve (function):  []   Changed:_ to improve (function):    Therapeutic Activity:      (minutes) :     Neuromuscular re-ed:      (minutes) :       Manual Therapy: GHJ mobs, ST mobs, PROM R shoulder      (minutes) :10      Gait Training: []  _ feet w/ _ device on level surface with _ level of assist  []   Other:_       (minutes) :     Patient Education: [x]  Review HEP    []  Progressed/Changed HEP based on:_   []  positioning   []  body mechanics   []  transfers   []  heat/ice application   (minutes) :    Other Objective/Functional Measures:   Pain Level (0-10 scale) post treatment: 4    ASSESSMENT  []   See Plan of Care  []   See progress note/recertification  [x]   Patient  will continue to benefit from skilled therapy to address remaining functional deficits: pain, limited ROM, limited joint mobility.     Progress towards goals / Updated goals: patient making  slow progress with PROM  Short Term Goals: To be accomplished in 1-2 weeks:   1. Patient will be I with HEP to improve outcomes - ? Compliance but demonstrates independence.   2. Improve R shoulder PROM flexion/scaption by 10 degrees for improved ability for ADLs. - MET  Long Term Goals: To be accomplished in 4 weeks:   1. Improve Quick Dash score by at least 16% for increased ability for functional activities.   2. Increase R shoulder PROM flexion to measure 130 degrees for increased ability for activity.-progressing   3. Increase R shoulder PROM IR to measure 60 degrees for increased ability for activity. - progressing  4. Increase R shoulder PROM ER to measure 40 degrees for increased ability for activity. - slow progress    PLAN  []   Upgrade activities as tolerated     [x]   Continue plan of care  []   Discharge due to:_  []  Other:_      Kendal Hymen, PT 04/04/2013  10:40 AM

## 2013-04-07 NOTE — Progress Notes (Signed)
PT DAILY TREATMENT NOTE     Patient Name: Laura Roman  Date:04/07/2013  DOB: 1950/12/24  [x]   Patient DOB Verified  Payor: BLUE CROSS  Plan: [facility] [employer]  Product Type: PPO     In time: 732 Out time: 835  Total Treatment Time (min): 63  Visit #:4 of 12    Treatment Area: Pain in joint, shoulder region [719.41]    SUBJECTIVE  Pain Level (0-10 scale): 4  Any medication changes, allergies to medications, adverse drug reactions, diagnosis change, or new procedure performed?: [x]  No    []  Yes (see summary sheet for update)  Subjective functional status/changes:   [x]  No changes reported      OBJECTIVE  Modality rationale:   Pain control   10 min [x]  Estim, type: IFC                                         []   att     [x]   unatt     []   w/US     [x]   w/ice    []   w/heat    min []   Mechanical Traction: type/lbs                                               []   pro   []   sup   []   int   []   cont    min []   Ultrasound, settings/location:      min []   Iontophoresis:  []   take home patch w/ dexamethazone    min                                []   in clinic w/ dexamethazone   5 min []   Ice     [x]   Heat     position: seated    min []   Other:      Therapeutic Exercise: [x]  see flow sheet     []  Other:_ (minutes) : 35  Added/Changed Exercises:  []   Added: _  to improve (function):  []   Changed:_ to improve (function):    Therapeutic Activity:      (minutes) :     Neuromuscular re-ed:      (minutes) :       Manual Therapy: GHJ mobs, ST mobs, PROM R shoulder      (minutes) :13      Gait Training: []  _ feet w/ _ device on level surface with _ level of assist  []   Other:_       (minutes) :     Patient Education: [x]  Review HEP    []  Progressed/Changed HEP based on:_   []  positioning   []  body mechanics   []  transfers   []  heat/ice application   (minutes) :    Other Objective/Functional Measures:   Pain Level (0-10 scale) post treatment: 3    ASSESSMENT  []   See Plan of Care  []   See progress note/recertification  [x]   Patient  will continue to benefit from skilled therapy to address remaining functional deficits: pain, limited ROM, limited joint mobility.     Progress towards goals / Updated goals:  Short Term Goals: To be accomplished in 1-2 weeks:   1. Patient will be I with HEP to improve outcomes - ? Compliance but demonstrates independence.   2. Improve R shoulder PROM flexion/scaption by 10 degrees for improved ability for ADLs. - MET  Long Term Goals: To be accomplished in 4 weeks:   1. Improve Quick Dash score by at least 16% for increased ability for functional activities.   2. Increase R shoulder PROM flexion to measure 130 degrees for increased ability for activity.-progressing   3. Increase R shoulder PROM IR to measure 60 degrees for increased ability for activity. - progressing  4. Increase R shoulder PROM ER to measure 40 degrees for increased ability for activity. - slow progress    PLAN  []   Upgrade activities as tolerated     [x]   Continue plan of care  []   Discharge due to:_  []  Other:_      Kendal Hymen, PT 04/07/2013  7:40 AM

## 2013-04-10 NOTE — Progress Notes (Addendum)
PT DAILY TREATMENT NOTE     Patient Name: Laura Roman  Date:04/10/2013  DOB: 1950/11/22  [x]   Patient DOB Verified  Payor: BLUE CROSS  Plan: [facility]  Product Type: PPO     In time: 903  Out time:1010   Total Treatment Time (min): 67  Visit #:5 of 12    Treatment Area: Pain in joint, shoulder region [719.41]    SUBJECTIVE  Pain Level (0-10 scale): 3  Any medication changes, allergies to medications, adverse drug reactions, diagnosis change, or new procedure performed?: [x]  No    []  Yes (see summary sheet for update)  Subjective functional status/changes:   [x]  No changes reported      OBJECTIVE  Modality rationale:   Pain control   10 min [x]  Estim, type: IFC                                         []   att     [x]   unatt     []   w/US     [x]   w/ice    []   w/heat    min []   Mechanical Traction: type/lbs                                               []   pro   []   sup   []   int   []   cont    min []   Ultrasound, settings/location:      min []   Iontophoresis:  []   take home patch w/ dexamethazone    min                                []   in clinic w/ dexamethazone   5 min []   Ice     [x]   Heat     position: seated    min []   Other:      Therapeutic Exercise: [x]  see flow sheet     []  Other:_ (minutes) : 37  Added/Changed Exercises:  []   Added: _  to improve (function):  []   Changed:_ to improve (function):    Therapeutic Activity:      (minutes) :     Neuromuscular re-ed:      (minutes) :       Manual Therapy: GHJ mobs, ST mobs, PROM R shoulder      (minutes) :15      Gait Training: []  _ feet w/ _ device on level surface with _ level of assist  []   Other:_       (minutes) :     Patient Education: [x]  Review HEP    []  Progressed/Changed HEP based on:_   []  positioning   []  body mechanics   []  transfers   []  heat/ice application   (minutes) :    Other Objective/Functional Measures: PROM scaption 113 degrees, ER 20 degrees, IR 43 degrees.  Pain Level (0-10 scale) post treatment: 3    ASSESSMENT  []   See Plan of  Care  []   See progress note/recertification  [x]   Patient will continue to benefit from skilled therapy to address remaining functional deficits: pain, limited ROM, limited joint mobility.  Progress towards goals / Updated goals:   Short Term Goals: To be accomplished in 1-2 weeks:   1. Patient will be I with HEP to improve outcomes - limited compliance  2. Improve R shoulder PROM flexion/scaption by 10 degrees for improved ability for ADLs. - MET  Long Term Goals: To be accomplished in 4 weeks:   1. Improve Quick Dash score by at least 16% for increased ability for functional activities. - improved 9%   2. Increase R shoulder PROM flexion to measure 130 degrees for increased ability for activity.-progressing   3. Increase R shoulder PROM IR to measure 60 degrees for increased ability for activity. - progressing  4. Increase R shoulder PROM ER to measure 40 degrees for increased ability for activity. - slow progress    PLAN  []   Upgrade activities as tolerated     [x]   Continue plan of care  []   Discharge due to:_  []  Other:_      Kendal Hymen, PT 04/10/2013  9:05 AM

## 2013-04-13 NOTE — Progress Notes (Signed)
PT DAILY TREATMENT NOTE     Patient Name: Mckaylie Vasey Hammontree  Date:04/13/2013  DOB: 01-Aug-1951  [x]   Patient DOB Verified  Payor: BLUE CROSS  Plan: [facility] [employer]  Product Type: PPO     In time: 902  Out time:1010   Total Treatment Time (min): 68  Visit #:5 of 12    Treatment Area: Pain in joint, shoulder region [719.41]    SUBJECTIVE  Pain Level (0-10 scale): 3  Any medication changes, allergies to medications, adverse drug reactions, diagnosis change, or new procedure performed?: [x]  No    []  Yes (see summary sheet for update)  Subjective functional status/changes:   [x]  No changes reported      OBJECTIVE  Modality rationale:   Pain control   10 min [x]  Estim, type: IFC                                         []   att     [x]   unatt     []   w/US     [x]   w/ice    []   w/heat    min []   Mechanical Traction: type/lbs                                               []   pro   []   sup   []   int   []   cont    min []   Ultrasound, settings/location:      min []   Iontophoresis:  []   take home patch w/ dexamethazone    min                                []   in clinic w/ dexamethazone   5 min []   Ice     [x]   Heat     position: seated    min []   Other:      Therapeutic Exercise: [x]  see flow sheet     []  Other:_ (minutes) : 38  Added/Changed Exercises:  []   Added: _  to improve (function):  []   Changed:_ to improve (function):    Therapeutic Activity:      (minutes) :     Neuromuscular re-ed:      (minutes) :       Manual Therapy: GHJ mobs, ST mobs, PROM R shoulder      (minutes) :15      Gait Training: []  _ feet w/ _ device on level surface with _ level of assist  []   Other:_       (minutes) :     Patient Education: [x]  Review HEP    []  Progressed/Changed HEP based on:_   []  positioning   []  body mechanics   []  transfers   []  heat/ice application   (minutes) :    Other Objective/Functional Measures:   Pain Level (0-10 scale) post treatment: 3    ASSESSMENT  []   See Plan of Care  []   See progress note/recertification  [x]    Patient will continue to benefit from skilled therapy to address remaining functional deficits: pain, limited ROM, limited joint mobility.     Progress towards goals / Updated goals:  Short Term Goals: To be accomplished in 1-2 weeks:   1. Patient will be I with HEP to improve outcomes - limited compliance  2. Improve R shoulder PROM flexion/scaption by 10 degrees for improved ability for ADLs. - MET  Long Term Goals: To be accomplished in 4 weeks:   1. Improve Quick Dash score by at least 16% for increased ability for functional activities. - improved 9%   2. Increase R shoulder PROM flexion to measure 130 degrees for increased ability for activity.-progressing   3. Increase R shoulder PROM IR to measure 60 degrees for increased ability for activity. - progressing  4. Increase R shoulder PROM ER to measure 40 degrees for increased ability for activity. - slow progress    PLAN  []   Upgrade activities as tolerated     [x]   Continue plan of care  []   Discharge due to:_  []  Other:_      Kendal Hymen, PT 04/13/2013  9:05 AM

## 2013-04-17 NOTE — Progress Notes (Signed)
PT DAILY TREATMENT NOTE     Patient Name: Noelene Gang Carrizales  Date:04/17/2013  DOB: 22-Aug-1951  [x]   Patient DOB Verified  Payor: BLUE CROSS  Plan: [facility] [employer]  Product Type: PPO     In time:810  Out time:915  Total Treatment Time (min): 75  Visit #:6 of 12    Treatment Area: Pain in joint, shoulder region [719.41]    SUBJECTIVE  Pain Level (0-10 scale): 3  Any medication changes, allergies to medications, adverse drug reactions, diagnosis change, or new procedure performed?: [x]  No    []  Yes (see summary sheet for update)  Subjective functional status/changes:   [x]  No changes reported      OBJECTIVE  Modality rationale:   Pain control   10 min [x]  Estim, type: IFC                                         []   att     [x]   unatt     []   w/US     [x]   w/ice    []   w/heat    min []   Mechanical Traction: type/lbs                                               []   pro   []   sup   []   int   []   cont    min []   Ultrasound, settings/location:      min []   Iontophoresis:  []   take home patch w/ dexamethazone    min                                []   in clinic w/ dexamethazone   5 min []   Ice     [x]   Heat     position: seated    min []   Other:      Therapeutic Exercise: [x]  see flow sheet     []  Other:_ (minutes) : 45  Added/Changed Exercises:  []   Added: _  to improve (function):  []   Changed:_ to improve (function):    Therapeutic Activity:      (minutes) :     Neuromuscular re-ed:      (minutes) :       Manual Therapy: GHJ mobs, ST mobs, PROM R shoulder      (minutes) :15      Gait Training: []  _ feet w/ _ device on level surface with _ level of assist  []   Other:_       (minutes) :     Patient Education: [x]  Review HEP    []  Progressed/Changed HEP based on:_   []  positioning   []  body mechanics   []  transfers   []  heat/ice application   (minutes) :    Other Objective/Functional Measures:   Pain Level (0-10 scale) post treatment: 3    ASSESSMENT  []   See Plan of Care  []   See progress note/recertification  [x]   Patient  will continue to benefit from skilled therapy to address remaining functional deficits: pain, limited ROM, limited joint mobility.     Progress towards goals / Updated goals:   Short  Term Goals: To be accomplished in 1-2 weeks:   1. Patient will be I with HEP to improve outcomes - limited compliance  2. Improve R shoulder PROM flexion/scaption by 10 degrees for improved ability for ADLs. - MET  Long Term Goals: To be accomplished in 4 weeks:   1. Improve Quick Dash score by at least 16% for increased ability for functional activities. - improved 9%   2. Increase R shoulder PROM flexion to measure 130 degrees for increased ability for activity.-progressing   3. Increase R shoulder PROM IR to measure 60 degrees for increased ability for activity. - progressing  4. Increase R shoulder PROM ER to measure 40 degrees for increased ability for activity. - slow progress    PLAN  []   Upgrade activities as tolerated     [x]   Continue plan of care  []   Discharge due to:_  []  Other:_      Kendal Hymen, PT 04/17/2013  9:05 AM

## 2013-04-24 NOTE — Progress Notes (Signed)
PT DAILY TREATMENT NOTE     Patient Name: Laura Roman  Date:04/24/2013  DOB: 03-Aug-1951  [x]   Patient DOB Verified  Payor: BLUE CROSS  [place]: [facility] [employer]  Product Type: PPO     In time:935  Out time:1043  Total Treatment Time (min): 68  Visit #:7 of 12    Treatment Area: Pain in joint, shoulder region [719.41]    SUBJECTIVE  Pain Level (0-10 scale): 3  Any medication changes, allergies to medications, adverse drug reactions, diagnosis change, or new procedure performed?: [x]  No    []  Yes (see summary sheet for update)  Subjective functional status/changes:   [x]  No changes reported      OBJECTIVE  Modality rationale:   Pain control   10 min [x]  Estim, type: IFC                                         []   att     [x]   unatt     []   w/US     [x]   w/ice    []   w/heat    min []   Mechanical Traction: type/lbs                                               []   pro   []   sup   []   int   []   cont    min []   Ultrasound, settings/location:      min []   Iontophoresis:  []   take home patch w/ dexamethazone    min                                []   in clinic w/ dexamethazone   5 min []   Ice     [x]   Heat     position: seated    min []   Other:      Therapeutic Exercise: [x]  see flow sheet     []  Other:_ (minutes) : 40  Added/Changed Exercises:  []   Added: _  to improve (function):  []   Changed:_ to improve (function):    Therapeutic Activity:      (minutes) :     Neuromuscular re-ed:      (minutes) :       Manual Therapy: GHJ mobs, ST mobs, PROM R shoulder      (minutes) :13      Gait Training: []  _ feet w/ _ device on level surface with _ level of assist  []   Other:_       (minutes) :     Patient Education: [x]  Review HEP    []  Progressed/Changed HEP based on:_   []  positioning   []  body mechanics   []  transfers   []  heat/ice application   (minutes) :    Other Objective/Functional Measures:   Pain Level (0-10 scale) post treatment: 3    ASSESSMENT  []   See Plan of Care  []   See progress note/recertification  [x]   Patient  will continue to benefit from skilled therapy to address remaining functional deficits: pain, limited ROM, limited joint mobility.     Progress towards goals / Updated goals:  Short Term Goals: To be accomplished in 1-2 weeks:   1. Patient will be I with HEP to improve outcomes - limited compliance  2. Improve R shoulder PROM flexion/scaption by 10 degrees for improved ability for ADLs. - MET  Long Term Goals: To be accomplished in 4 weeks:   1. Improve Quick Dash score by at least 16% for increased ability for functional activities. - improved 9%   2. Increase R shoulder PROM flexion to measure 130 degrees for increased ability for activity.-progressing   3. Increase R shoulder PROM IR to measure 60 degrees for increased ability for activity. - progressing  4. Increase R shoulder PROM ER to measure 40 degrees for increased ability for activity. - slow progress    PLAN  []   Upgrade activities as tolerated     [x]   Continue plan of care  []   Discharge due to:_  []  Other:_      Kendal Hymen, PT 04/24/2013  9:05 AM

## 2013-04-27 ENCOUNTER — Telehealth: Payer: Self-pay | Admitting: Neurology

## 2013-05-01 NOTE — Telephone Encounter (Signed)
LMVM for pt ro return call.

## 2013-05-01 NOTE — Telephone Encounter (Signed)
I called the patient. She has had a change in the balance, and her cognitive issues have worsened. We will try to set up a work in revisit. The patient currently is in [place], not expected to return until July 13. The patient indicates that she fell in December, and got a rupture of the biceps tendon and a rotator cuff tear. The patient has undergone physical therapy. 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

## 2013-05-01 NOTE — Progress Notes (Signed)
PT DAILY TREATMENT NOTE     Patient Name: Laura Roman  Date:05/01/2013  DOB: Dec 07, 1950  [x]   Patient DOB Verified  Payor: BLUE CROSS  Plan[place] [facility] [employer]  Product Type: PPO     In time:810  Out time:915  Total Treatment Time (min): 75  Visit #:8 of 12    Treatment Area: Pain in joint, shoulder region [719.41]    SUBJECTIVE  Pain Level (0-10 scale): 3-4  Any medication changes, allergies to medications, adverse drug reactions, diagnosis change, or new procedure performed?: [x]  No    []  Yes (see summary sheet for update)  Subjective functional status/changes:   [x]  No changes reported      OBJECTIVE  Modality rationale:   Pain control   10 min [x]  Estim, type: IFC                                         []   att     [x]   unatt     []   w/US     [x]   w/ice    []   w/heat    min []   Mechanical Traction: type/lbs                                               []   pro   []   sup   []   int   []   cont    min []   Ultrasound, settings/location:      min []   Iontophoresis:  []   take home patch w/ dexamethazone    min                                []   in clinic w/ dexamethazone   5 min []   Ice     [x]   Heat     position: seated    min []   Other:      Therapeutic Exercise: [x]  see flow sheet     []  Other:_ (minutes) : 45  Added/Changed Exercises:  []   Added: _  to improve (function):  []   Changed:_ to improve (function):    Therapeutic Activity:      (minutes) :     Neuromuscular re-ed:      (minutes) :       Manual Therapy: GHJ mobs, PROM R shoulder      (minutes) :15      Gait Training: []  _ feet w/ _ device on level surface with _ level of assist  []   Other:_       (minutes) :     Patient Education: [x]  Review HEP    []  Progressed/Changed HEP based on:_   []  positioning   []  body mechanics   []  transfers   []  heat/ice application   (minutes) :    Other Objective/Functional Measures:   Pain Level (0-10 scale) post treatment: 3    ASSESSMENT  []   See Plan of Care  []   See progress note/recertification  [x]   Patient will  continue to benefit from skilled therapy to address remaining functional deficits: pain, limited ROM, limited joint mobility.     Progress towards goals / Updated goals:    Short Term  Goals: To be accomplished in 1-2 weeks:   1. Patient will be I with HEP to improve outcomes - pt with limited compliance  2. Improve R shoulder PROM flexion/scaption by 10 degrees for improved ability for ADLs. - MET  Long Term Goals: To be accomplished in 4 weeks:   1. Improve Quick Dash score by at least 16% for increased ability for functional activities. - improved 9%   2. Increase R shoulder PROM flexion to measure 130 degrees for increased ability for activity.-progressing   3. Increase R shoulder PROM IR to measure 60 degrees for increased ability for activity. - progressing  4. Increase R shoulder PROM ER to measure 40 degrees for increased ability for activity. - slow progress    PLAN  []   Upgrade activities as tolerated     [x]   Continue plan of care  []   Discharge due to:_  []  Other:_      Kendal Hymen, PT 05/01/2013  9:05 AM

## 2013-05-04 NOTE — Progress Notes (Signed)
PT DAILY TREATMENT NOTE     Patient Name: Billie Trager Sandall  Date:05/04/2013  DOB: 01-14-1951  [x]   Patient DOB Verified  Payor: BLUE CROSS  Plan: [facility] [employer]  Product Type: PPO     In time:842  Out time:945  Total Treatment Time (min): 63  Visit # 9 of 12    Treatment Area: Pain in joint, shoulder region [719.41]    SUBJECTIVE  Pain Level (0-10 scale): 3  Any medication changes, allergies to medications, adverse drug reactions, diagnosis change, or new procedure performed?: [x]  No    []  Yes (see summary sheet for update)  Subjective functional status/changes:   [x]  No changes reported      OBJECTIVE  Modality rationale:   Pain control   10 min [x]  Estim, type: IFC                                         []   att     [x]   unatt     []   w/US     [x]   w/ice    []   w/heat    min []   Mechanical Traction: type/lbs                                               []   pro   []   sup   []   int   []   cont    min []   Ultrasound, settings/location:      min []   Iontophoresis:  []   take home patch w/ dexamethazone    min                                []   in clinic w/ dexamethazone   5 min []   Ice     [x]   Heat     position: seated    min []   Other:      Therapeutic Exercise: [x]  see flow sheet     []  Other:_ (minutes) : 38  Added/Changed Exercises:  []   Added: _  to improve (function):  []   Changed:_ to improve (function):    Therapeutic Activity:      (minutes) :     Neuromuscular re-ed:      (minutes) :       Manual Therapy: GHJ mobs, PROM R shoulder      (minutes) :10      Gait Training: []  _ feet w/ _ device on level surface with _ level of assist  []   Other:_       (minutes) :     Patient Education: [x]  Review HEP    []  Progressed/Changed HEP based on:_   []  positioning   []  body mechanics   []  transfers   []  heat/ice application   (minutes) :    Other Objective/Functional Measures:   Pain Level (0-10 scale) post treatment: 2    ASSESSMENT  []   See Plan of Care  []   See progress note/recertification  [x]   Patient will  continue to benefit from skilled therapy to address remaining functional deficits: pain, limited ROM, limited joint mobility.     Progress towards goals / Updated goals:   Patient with  slow progress with PROM.   Short Term Goals: To be accomplished in 1-2 weeks:   1. Patient will be I with HEP to improve outcomes - pt with limited compliance  2. Improve R shoulder PROM flexion/scaption by 10 degrees for improved ability for ADLs. - MET  Long Term Goals: To be accomplished in 4 weeks:   1. Improve Quick Dash score by at least 16% for increased ability for functional activities. - improved 9%   2. Increase R shoulder PROM flexion to measure 130 degrees for increased ability for activity.-progressing   3. Increase R shoulder PROM IR to measure 60 degrees for increased ability for activity. - progressing  4. Increase R shoulder PROM ER to measure 40 degrees for increased ability for activity. - slow progress    PLAN  []   Upgrade activities as tolerated     [x]   Continue plan of care  []   Discharge due to:_  []  Other:_      Kendal Hymen, PT 05/04/2013  9:05 AM

## 2013-05-09 NOTE — Progress Notes (Signed)
PT DAILY TREATMENT NOTE     Patient Name: Laura Roman  Date:05/09/2013  DOB: 07-May-1951  [x]   Patient DOB Verified  Payor: BLUE CROSS  Plan: [facility] [employer]  Product Type: PPO     In time:909  Out time:1022  Total Treatment Time (min): 73  Visit # 10 of 12    Treatment Area: Pain in joint, shoulder region [719.41]    SUBJECTIVE  Pain Level (0-10 scale): 3  Any medication changes, allergies to medications, adverse drug reactions, diagnosis change, or new procedure performed?: [x]  No    []  Yes (see summary sheet for update)  Subjective functional status/changes:   [x]  No changes reported      OBJECTIVE  Modality rationale:   Pain control   10 min [x]  Estim, type: IFC                                         []   att     [x]   unatt     []   w/US     [x]   w/ice    []   w/heat    min []   Mechanical Traction: type/lbs                                               []   pro   []   sup   []   int   []   cont    min []   Ultrasound, settings/location:      min []   Iontophoresis:  []   take home patch w/ dexamethazone    min                                []   in clinic w/ dexamethazone   5 min []   Ice     [x]   Heat     position: seated    min []   Other:      Therapeutic Exercise: [x]  see flow sheet     []  Other:_ (minutes) : 43  Added/Changed Exercises:  []   Added: _  to improve (function):  []   Changed:_ to improve (function):    Therapeutic Activity:      (minutes) :     Neuromuscular re-ed:      (minutes) :       Manual Therapy: STJ/GHJ mobs grade III/IV, PROM R shoulder      (minutes) :15      Gait Training: []  _ feet w/ _ device on level surface with _ level of assist  []   Other:_       (minutes) :     Patient Education: [x]  Review HEP    []  Progressed/Changed HEP based on:_   []  positioning   []  body mechanics   []  transfers   []  heat/ice application   (minutes) :    Other Objective/Functional Measures: scaption 140, IR 55, ER 35 degrees   Pain Level (0-10 scale) post treatment: 2    ASSESSMENT  []   See Plan of Care  []   See  progress note/recertification  [x]   Patient will continue to benefit from skilled therapy to address remaining functional deficits: pain, limited ROM, limited joint mobility.     Progress  towards goals / Updated goals:   Patient with slow progress with PROM.   Short Term Goals: To be accomplished in 1-2 weeks:   1. Patient will be I with HEP to improve outcomes - pt with limited compliance  2. Improve R shoulder PROM flexion/scaption by 10 degrees for improved ability for ADLs. - MET  Long Term Goals: To be accomplished in 4 weeks:   1. Improve Quick Dash score by at least 16% for increased ability for functional activities. - improved 9%   2. Increase R shoulder PROM flexion to measure 130 degrees for increased ability for activity.-MET  3. Increase R shoulder PROM IR to measure 60 degrees for increased ability for activity. - progressing  4. Increase R shoulder PROM ER to measure 40 degrees for increased ability for activity. - slow progress    PLAN  []   Upgrade activities as tolerated     [x]   Continue plan of care  []   Discharge due to:_  []  Other:_      Kendal Hymen, PT 05/09/2013  9:15 AM

## 2013-05-11 NOTE — Progress Notes (Signed)
[facility] ??? [facility]  [address] [place] [address]  [place], [place] [address]  [phone]  [phone] fax    Physical Therapy Progress Note  Patient name: Laura Roman Referral source: Alisa Graff, MD   DOB: 03/12/1951 Diagnosis: Pain in joint, shoulder region [719.41]   Date of initial visit: 02-09-13    Visits from Start of Care: 24 Missed Visits: 1         Established Goals:        Excellent         Good         Limited            None  [x]  Increased ROM   []   [x]   []   []   []  Increased Strength  []   []   []   []   [x]  Increased Mobility  []   [x]   []   []    [x]  Decreased Pain   []   []   [x]   []   []  Decreased Swelling  []   []   []   []     Key Functional Changes: patient is making slow progress with PT. Pain rates 3/10 at most times. PROM of the R shoulder as follows: flexion/scaption 140 degrees, ER 35 degrees, IR 55 degrees. + capsular restrictions but have improved.  Patient will benefit from continuation to further ROM.     Updated Goals: to be achieved in 4 weeks:  1. Improve Quick Dash score by at least 16% for increased ability for functional activities.    2. Increase R shoulder PROM flexion to measure 150 degrees for increased ability for activity.  3. Increase R shoulder PROM IR to measure 60 degrees for increased ability for activity.    4. Increase R shoulder PROM ER to measure 40 degrees for increased ability for activity.       ASSESSMENT/RECOMMENDATIONS:  [x] Continue therapy per initial plan/protocol at a frequency of  2 x per week for 4 weeks  [] Continue therapy with the following recommended changes:_____________________      _____________________________________________________________________  [] Discontinue therapy progressing towards or have reached established goals  [] Discontinue therapy due to lack of appreciable progress towards goals  [] Discontinue therapy due to lack of attendance or compliance  [] Await Physician's recommendations/decisions regarding therapy   [] Other:________________________________________________________________    Thank you for this referral.    Kendal Hymen, PT 05/11/2013 11:28 AM  NOTE TO PHYSICIAN:  PLEASE COMPLETE THE ORDERS BELOW AND   FAX TO [facility]: [phone]  If you are unable to process this request in 24 hours please contact our office: [phone]      I have read the above report and request that my patient continue as recommended.    I have read the above report and request that my patient continue therapy with the following changes/special instructions:__________________________________________________________    I have read the above report and request that my patient be discharged from therapy.    Physician???s signature: ______________________________Date: ______Time:______

## 2013-05-11 NOTE — Progress Notes (Signed)
PT DAILY TREATMENT NOTE     Patient Name: Laura Roman  Date:05/11/2013  DOB: 12-Oct-1951  [x]   Patient DOB Verified  Payor: BLUE CROSS  Plan: [facility] [employer]  Product Type: PPO     In time:909  Out time:1020  Total Treatment Time (min): 71  Visit # 11 of 12    Treatment Area: Pain in joint, shoulder region [719.41]    SUBJECTIVE  Pain Level (0-10 scale): 3  Any medication changes, allergies to medications, adverse drug reactions, diagnosis change, or new procedure performed?: [x]  No    []  Yes (see summary sheet for update)  Subjective functional status/changes:   [x]  No changes reported      OBJECTIVE  Modality rationale:   Pain control   10 min [x]  Estim, type: IFC                                         []   att     [x]   unatt     []   w/US     [x]   w/ice    []   w/heat    min []   Mechanical Traction: type/lbs                                               []   pro   []   sup   []   int   []   cont    min []   Ultrasound, settings/location:      min []   Iontophoresis:  []   take home patch w/ dexamethazone    min                                []   in clinic w/ dexamethazone   5 min []   Ice     [x]   Heat     position: seated    min []   Other:      Therapeutic Exercise: [x]  see flow sheet     []  Other:_ (minutes) : 41  Added/Changed Exercises:  []   Added: _  to improve (function):  []   Changed:_ to improve (function):    Therapeutic Activity:      (minutes) :     Neuromuscular re-ed:      (minutes) :       Manual Therapy: STJ/GHJ mobs grade III/IV, PROM R shoulder      (minutes) :15      Gait Training: []  _ feet w/ _ device on level surface with _ level of assist  []   Other:_       (minutes) :     Patient Education: [x]  Review HEP    []  Progressed/Changed HEP based on:_   []  positioning   []  body mechanics   []  transfers   []  heat/ice application   (minutes) :    Other Objective/Functional Measures: scaption 140, IR 55, ER 35 degrees   Pain Level (0-10 scale) post treatment: 2    ASSESSMENT  []   See Plan of Care  [x]   See  progress note/recertification  [x]   Patient will continue to benefit from skilled therapy to address remaining functional deficits: pain, limited ROM, limited joint mobility.     Progress  towards goals / Updated goals:   Patient with slow progress with PROM.   Short Term Goals: To be accomplished in 1-2 weeks:   1. Patient will be I with HEP to improve outcomes - pt with limited compliance  2. Improve R shoulder PROM flexion/scaption by 10 degrees for improved ability for ADLs. - MET  Long Term Goals: To be accomplished in 4 weeks:   1. Improve Quick Dash score by at least 16% for increased ability for functional activities. - improved 9%   2. Increase R shoulder PROM flexion to measure 130 degrees for increased ability for activity.-MET  3. Increase R shoulder PROM IR to measure 60 degrees for increased ability for activity. - progressing  4. Increase R shoulder PROM ER to measure 40 degrees for increased ability for activity. - slow progress    PLAN  []   Upgrade activities as tolerated     [x]   Continue plan of care  []   Discharge due to:_  []  Other:_      Kendal Hymen, PT 05/11/2013  9:15 AM

## 2013-05-16 ENCOUNTER — Other Ambulatory Visit: Payer: Self-pay | Admitting: Neurology

## 2013-05-16 NOTE — Progress Notes (Signed)
PT DAILY TREATMENT NOTE     Patient Name: Laura Roman  Date:05/16/2013  DOB: [birth year]  [x]   Patient DOB Verified  Payor: BLUE CROSS  Plan: [facility] [employer]  Product Type: PPO     In time: 8:39  Out time: 9:34  Total Treatment Time (min):  55  Visit #: 1 of 8    Treatment Area: Pain in joint, shoulder region [719.41]    SUBJECTIVE  Pain Level (0-10 scale): 4  Any medication changes, allergies to medications, adverse drug reactions, diagnosis change, or new procedure performed?: [x]  No    []  Yes (see summary sheet for update)  Subjective functional status/changes:   []  No changes reported  "I think the weather is making it really sore today"    OBJECTIVE  Modality rationale:  pain management   10 min [x]  Estim, type: IFC                                         []   att     [x]   unatt     []   w/US     [x]   w/ice    []   w/heat    min []   Mechanical Traction: type/lbs                                               []   pro   []   sup   []   int   []   cont    min []   Ultrasound, settings/location:      min []   Iontophoresis:  []   take home patch w/ dexamethazone    min                                []   in clinic w/ dexamethazone   5 min []   Ice     []   Heat     position: seated    min []   Other:      Therapeutic Exercise: [x]  see flow sheet     []  Other:_ (minutes) : 30  Added/Changed Exercises:  []   Added:_  to improve (function):  []   Changed:_ to improve (function):    Therapeutic Activity:      (minutes) :    Neuromuscular re-ed:      (minutes) :      Manual Therapy:       (minutes) : 10  STJ/GHJ mobs gr III/IV, PROM R shoulder      Gait Training: []  _ feet w/ _ device on level surface with _ level of assist  []   Other:_       (minutes) :    Patient Education: []  Review HEP    []  Progressed/Changed HEP based on:_   []  positioning   []  body mechanics   []  transfers   []  heat/ice application   (minutes) :    Other Objective/Functional Measures:   Added posterior capsule str  Held therex per flow due to pt 9' late  QD  improved to 27.5%    Pain Level (0-10 scale) post treatment: 2    ASSESSMENT  []   See Plan of Care  []   See progress note/recertification  [x]   Patient will continue to benefit from skilled therapy to address remaining functional deficits:  Dec ROM, dec joint mobility, dec ADLs    Progress towards goals / Updated goals:  Short Term Goals: To be accomplished in 1-2 weeks:   1. Patient will be I with HEP to improve outcomes - pt with limited compliance   2. Improve R shoulder PROM flexion/scaption by 10 degrees for improved ability for ADLs.  Long Term Goals: To be accomplished in 4 weeks:   1. Improve Quick Dash score by at least 16% for increased ability for functional activities. - 05/16/13 improved 24.5%, met  2. Increase R shoulder PROM flexion to measure 130 degrees for increased ability for activity  3. Increase R shoulder PROM IR to measure 60 degrees for increased ability for activity. - progressing   4. Increase R shoulder PROM ER to measure 40 degrees for increased ability for activity. - slow progress      PLAN  []   Upgrade activities as tolerated     [x]   Continue plan of care  []   Discharge due to:_  []  Other:_      Garrison [place], PT 05/16/2013  10:18 AM

## 2013-05-18 NOTE — Progress Notes (Addendum)
PT DAILY TREATMENT NOTE     Patient Name: Sheryl Saintil Giaimo  Date:05/18/2013  DOB: 05-25-1951  [x]   Patient DOB Verified  Payor: BLUE CROSS  Plan: [facility] [employer]  Product Type: PPO     In time: 8:36  Out time:9:36  Total Treatment Time (min): 60  Visit #: 2 of 8    Treatment Area: Pain in joint, shoulder region [719.41]    SUBJECTIVE  Pain Level (0-10 scale): 4  Any medication changes, allergies to medications, adverse drug reactions, diagnosis change, or new procedure performed?: [x]  No    []  Yes (see summary sheet for update)  Subjective functional status/changes:   []  No changes reported  "I wish I could say it was better but it still hurts a lot"    OBJECTIVE  Modality rationale:  dec pain   10 min [x]  Estim, type: IFC                                         []   att     [x]   unatt     []   w/US     [x]   w/ice    []   w/heat    min []   Mechanical Traction: type/lbs                                               []   pro   []   sup   []   int   []   cont    min []   Ultrasound, settings/location:      min []   Iontophoresis:  []   take home patch w/ dexamethazone    min                                []   in clinic w/ dexamethazone   5 min []   Ice     [x]   Heat     position: seated    min []   Other:      Therapeutic Exercise: [x]  see flow sheet     []  Other:_ (minutes) : 33  Added/Changed Exercises:  []   Added:_  to improve (function):  []   Changed:_ to improve (function):    Therapeutic Activity:      (minutes) :    Neuromuscular re-ed:      (minutes) :      Manual Therapy:       (minutes) : 12  STJ/GHJ mobs gr III/IV, PROM R shoulder with scap blocking      Gait Training: []  _ feet w/ _ device on level surface with _ level of assist  []   Other:_       (minutes) :    Patient Education: []  Review HEP    []  Progressed/Changed HEP based on:_   []  positioning   []  body mechanics   []  transfers   []  heat/ice application   (minutes) :    Other Objective/Functional Measures:   Pt cont to report pain with ER wall str and standing  cane ER    Pain Level (0-10 scale) post treatment: 3    ASSESSMENT  []   See Plan of Care  []   See progress note/recertification  [x]   Patient will continue to benefit from skilled therapy to address remaining functional deficits: dec ROM, dec capsular mobility, pain, dec ADL tolerance    Progress towards goals / Updated goals:  Short Term Goals: To be accomplished in 1-2 weeks:   1. Patient will be I with HEP to improve outcomes - pt with limited compliance   2. Improve R shoulder PROM flexion/scaption by 10 degrees for improved ability for ADLs.   Long Term Goals: To be accomplished in 4 weeks:   1. Improve Quick Dash score by at least 16% for increased ability for functional activities.  2. Increase R shoulder PROM flexion to measure 130 degrees for increased ability for activity   3. Increase R shoulder PROM IR to measure 60 degrees for increased ability for activity. -   4. Increase R shoulder PROM ER to measure 40 degrees for increased ability for activity. - slow progress, PROM ER ~35 deg 05/18/13      PLAN  []   Upgrade activities as tolerated     [x]   Continue plan of care  []   Discharge due to:_  []  Other:_      Garrison [place], PT 05/18/2013  9:11 AM

## 2013-05-23 NOTE — Progress Notes (Signed)
PT DAILY TREATMENT NOTE     Patient Name: Laura Roman  Date:05/23/2013  DOB: 07/19/1951  [x]   Patient DOB Verified  Payor: BLUE CROSS  Plan: [facility] [employer]  Product Type: PPO     In time: 9:06  Out time:1024  Total Treatment Time (min):78   Visit #: 3 of 8    Treatment Area: Pain in joint, shoulder region [719.41]    SUBJECTIVE  Pain Level (0-10 scale): 4  Any medication changes, allergies to medications, adverse drug reactions, diagnosis change, or new procedure performed?: [x]  No    []  Yes (see summary sheet for update)  Subjective functional status/changes:   [x]  No changes reported      OBJECTIVE  Modality rationale:  dec pain   10 min [x]  Estim, type: IFC                                         []   att     [x]   unatt     []   w/US     [x]   w/ice    []   w/heat    min []   Mechanical Traction: type/lbs                                               []   pro   []   sup   []   int   []   cont    min []   Ultrasound, settings/location:      min []   Iontophoresis:  []   take home patch w/ dexamethazone    min                                []   in clinic w/ dexamethazone   5 min []   Ice     [x]   Heat     position: seated    min []   Other:      Therapeutic Exercise: [x]  see flow sheet     []  Other:_ (minutes) : 38  Added/Changed Exercises:  []   Added:_  to improve (function):  []   Changed:_ to improve (function):    Therapeutic Activity:      (minutes) :    Neuromuscular re-ed:      (minutes) :      Manual Therapy:       (minutes) : 25  R STJ/GHJ mobs gr III/IV, PROM R shoulder       Gait Training: []  _ feet w/ _ device on level surface with _ level of assist  []   Other:_       (minutes) :    Patient Education: []  Review HEP    []  Progressed/Changed HEP based on:_   []  positioning   []  body mechanics   []  transfers   []  heat/ice application   (minutes) :    Other Objective/Functional Measures:     Pain Level (0-10 scale) post treatment: 4    ASSESSMENT  []   See Plan of Care  []   See progress note/recertification  [x]    Patient will continue to benefit from skilled therapy to address remaining functional deficits: dec ROM, dec capsular mobility, pain, dec ADL tolerance    Progress towards goals /  Updated goals:  Short Term Goals: To be accomplished in 1-2 weeks:   1. Patient will be I with HEP to improve outcomes - pt with limited compliance   2. Improve R shoulder PROM flexion/scaption by 10 degrees for improved ability for ADLs.   Long Term Goals: To be accomplished in 4 weeks:   1. Improve Quick Dash score by at least 16% for increased ability for functional activities.  2. Increase R shoulder PROM flexion to measure 130 degrees for increased ability for activity   3. Increase R shoulder PROM IR to measure 60 degrees for increased ability for activity. -   4. Increase R shoulder PROM ER to measure 40 degrees for increased ability for activity. - slow progress, PROM ER ~35 deg 05/18/13      PLAN  []   Upgrade activities as tolerated     [x]   Continue plan of care  []   Discharge due to:_  []  Other:_      Kendal Hymen, PT 05/23/2013  9:11 AM

## 2013-05-25 NOTE — Progress Notes (Signed)
PT DAILY TREATMENT NOTE     Patient Name: Laura Roman  Date:05/25/2013  DOB: 1951/05/04  [x]   Patient DOB Verified  Payor: BLUE CROSS  Plan: [facility] [employer]  Product Type: PPO     In time: 903   Out time: 1012  Total Treatment Time (min): 69  Visit #: 4 of 8    Treatment Area: Pain in joint, shoulder region [719.41]    SUBJECTIVE  Pain Level (0-10 scale): 3  Any medication changes, allergies to medications, adverse drug reactions, diagnosis change, or new procedure performed?: [x]  No    []  Yes (see summary sheet for update)  Subjective functional status/changes:   [x]  No changes reported      OBJECTIVE  Modality rationale:  dec pain   10 min [x]  Estim, type: IFC                                         []   att     [x]   unatt     []   w/US     [x]   w/ice    []   w/heat    min []   Mechanical Traction: type/lbs                                               []   pro   []   sup   []   int   []   cont    min []   Ultrasound, settings/location:      min []   Iontophoresis:  []   take home patch w/ dexamethazone    min                                []   in clinic w/ dexamethazone   5 min []   Ice     [x]   Heat     position: seated    min []   Other:      Therapeutic Exercise: [x]  see flow sheet     []  Other:_ (minutes) : 39  Added/Changed Exercises:  []   Added:_  to improve (function):  []   Changed:_ to improve (function):    Therapeutic Activity:      (minutes) :    Neuromuscular re-ed:      (minutes) :      Manual Therapy:       (minutes) : 15  R STJ/GHJ mobs gr III/IV, PROM R shoulder       Gait Training: []  _ feet w/ _ device on level surface with _ level of assist  []   Other:_       (minutes) :    Patient Education: []  Review HEP    []  Progressed/Changed HEP based on:_   []  positioning   []  body mechanics   []  transfers   []  heat/ice application   (minutes) :    Other Objective/Functional Measures:     Pain Level (0-10 scale) post treatment:  2    ASSESSMENT  []   See Plan of Care  []   See progress note/recertification  [x]    Patient will continue to benefit from skilled therapy to address remaining functional deficits: dec ROM, dec capsular mobility, pain, dec ADL tolerance  Progress towards goals / Updated goals:  Short Term Goals: To be accomplished in 1-2 weeks:   1. Patient will be I with HEP to improve outcomes - pt with limited compliance   2. Improve R shoulder PROM flexion/scaption by 10 degrees for improved ability for ADLs.   Long Term Goals: To be accomplished in 4 weeks:   1. Improve Quick Dash score by at least 16% for increased ability for functional activities.  2. Increase R shoulder PROM flexion to measure 130 degrees for increased ability for activity   3. Increase R shoulder PROM IR to measure 60 degrees for increased ability for activity. -   4. Increase R shoulder PROM ER to measure 40 degrees for increased ability for activity. - slow progress, PROM ER ~35 deg 05/18/13      PLAN  []   Upgrade activities as tolerated     [x]   Continue plan of care  []   Discharge due to:_  []  Other:_      Kendal Hymen, PT 05/25/2013  9:11 AM

## 2013-05-30 NOTE — Progress Notes (Signed)
PT DAILY TREATMENT NOTE     Patient Name: Laura Roman  Date:05/30/2013  DOB: 1950-12-16  [x]   Patient DOB Verified  Payor: BLUE CROSS  Plan: [facility] [employer]  Product Type: PPO     In time: 905  Out time: 1014  Total Treatment Time (min): 69  Visit #: 5 of 8    Treatment Area: Pain in joint, shoulder region [719.41]    SUBJECTIVE  Pain Level (0-10 scale): 3  Any medication changes, allergies to medications, adverse drug reactions, diagnosis change, or new procedure performed?: [x]  No    []  Yes (see summary sheet for update)  Subjective functional status/changes:   [x]  No changes reported      OBJECTIVE  Modality rationale:  dec pain   10 min [x]  Estim, type: IFC                                         []   att     [x]   unatt     []   w/US     [x]   w/ice    []   w/heat    min []   Mechanical Traction: type/lbs                                               []   pro   []   sup   []   int   []   cont    min []   Ultrasound, settings/location:      min []   Iontophoresis:  []   take home patch w/ dexamethazone    min                                []   in clinic w/ dexamethazone   5 min []   Ice     [x]   Heat     position: seated    min []   Other:      Therapeutic Exercise: [x]  see flow sheet     []  Other:_ (minutes) : 39  Added/Changed Exercises:  []   Added:_  to improve (function):  []   Changed:_ to improve (function):    Therapeutic Activity:      (minutes) :    Neuromuscular re-ed:      (minutes) :      Manual Therapy:       (minutes) : 15  R STJ/GHJ mobs gr III/IV, PROM R shoulder       Gait Training: []  _ feet w/ _ device on level surface with _ level of assist  []   Other:_       (minutes) :    Patient Education: []  Review HEP    []  Progressed/Changed HEP based on:_   []  positioning   []  body mechanics   []  transfers   []  heat/ice application   (minutes) :    Other Objective/Functional Measures:     Pain Level (0-10 scale) post treatment:  3    ASSESSMENT  []   See Plan of Care  []   See progress note/recertification  [x]    Patient will continue to benefit from skilled therapy to address remaining functional deficits: dec ROM, dec capsular mobility, pain, dec ADL tolerance    Progress  towards goals / Updated goals: patient with slow progress.   Short Term Goals: To be accomplished in 1-2 weeks:   1. Patient will be I with HEP to improve outcomes - pt with limited compliance   2. Improve R shoulder PROM flexion/scaption by 10 degrees for improved ability for ADLs.   Long Term Goals: To be accomplished in 4 weeks:   1. Improve Quick Dash score by at least 16% for increased ability for functional activities.  2. Increase R shoulder PROM flexion to measure 130 degrees for increased ability for activity   3. Increase R shoulder PROM IR to measure 60 degrees for increased ability for activity. -   4. Increase R shoulder PROM ER to measure 40 degrees for increased ability for activity. - slow progress, PROM ER ~35 deg 05/18/13      PLAN  []   Upgrade activities as tolerated     [x]   Continue plan of care  []   Discharge due to:_  []  Other:_      Kendal Hymen, PT 05/30/2013  9:11 AM

## 2013-06-01 NOTE — Progress Notes (Signed)
PT DAILY TREATMENT NOTE     Patient Name: Laura Roman  Date:06/01/2013  DOB: 1951/06/07  [x]   Patient DOB Verified  Payor: BLUE CROSS  Plan: [facility] [employer]  Product Type: PPO     In time: 937  Out time: 1047  Total Treatment Time (min): 70  Visit #: 6 of 8    Treatment Area: Pain in joint, shoulder region [719.41]    SUBJECTIVE  Pain Level (0-10 scale): 6  Any medication changes, allergies to medications, adverse drug reactions, diagnosis change, or new procedure performed?: [x]  No    []  Yes (see summary sheet for update)  Subjective functional status/changes:   [x]  No changes reported      OBJECTIVE  Modality rationale:  dec pain   10 min [x]  Estim, type: IFC                                         []   att     [x]   unatt     []   w/US     [x]   w/ice    []   w/heat    min []   Mechanical Traction: type/lbs                                               []   pro   []   sup   []   int   []   cont    min []   Ultrasound, settings/location:      min []   Iontophoresis:  []   take home patch w/ dexamethazone    min                                []   in clinic w/ dexamethazone   5 min []   Ice     [x]   Heat     position: seated    min []   Other:      Therapeutic Exercise: [x]  see flow sheet     []  Other:_ (minutes) : 40  Added/Changed Exercises:  []   Added:_  to improve (function):  []   Changed:_ to improve (function):    Therapeutic Activity:      (minutes) :    Neuromuscular re-ed:      (minutes) :      Manual Therapy:       (minutes) : 15  R STJ/GHJ mobs gr III/IV, PROM R shoulder       Gait Training: []  _ feet w/ _ device on level surface with _ level of assist  []   Other:_       (minutes) :    Patient Education: []  Review HEP    []  Progressed/Changed HEP based on:_   []  positioning   []  body mechanics   []  transfers   []  heat/ice application   (minutes) :    Other Objective/Functional Measures:     Pain Level (0-10 scale) post treatment:  5    ASSESSMENT  []   See Plan of Care  []   See progress note/recertification  [x]    Patient will continue to benefit from skilled therapy to address remaining functional deficits: dec ROM, dec capsular mobility, pain, dec ADL tolerance    Progress  towards goals / Updated goals: patient with slow progress.   Short Term Goals: To be accomplished in 1-2 weeks:   1. Patient will be I with HEP to improve outcomes - pt with limited compliance   2. Improve R shoulder PROM flexion/scaption by 10 degrees for improved ability for ADLs.   Long Term Goals: To be accomplished in 4 weeks:   1. Improve Quick Dash score by at least 16% for increased ability for functional activities.  2. Increase R shoulder PROM flexion to measure 130 degrees for increased ability for activity   3. Increase R shoulder PROM IR to measure 60 degrees for increased ability for activity. -   4. Increase R shoulder PROM ER to measure 40 degrees for increased ability for activity. - slow progress, PROM ER ~35 deg 05/18/13      PLAN  []   Upgrade activities as tolerated     [x]   Continue plan of care  []   Discharge due to:_  []  Other:_      Kendal Hymen, PT 06/01/2013  9:11 AM

## 2013-06-07 ENCOUNTER — Encounter

## 2013-06-20 ENCOUNTER — Other Ambulatory Visit: Payer: Self-pay | Admitting: Gastroenterology

## 2013-06-20 DIAGNOSIS — R109 Unspecified abdominal pain: Secondary | ICD-10-CM

## 2013-06-20 DIAGNOSIS — R1110 Vomiting, unspecified: Secondary | ICD-10-CM

## 2013-06-20 DIAGNOSIS — R110 Nausea: Secondary | ICD-10-CM

## 2013-06-21 ENCOUNTER — Ambulatory Visit
Admission: RE | Admit: 2013-06-21 | Discharge: 2013-06-21 | Disposition: A | Discharge status: Home / Self Care | Payer: BC Managed Care – PPO | Source: Ambulatory Visit | Attending: Gastroenterology | Admitting: Gastroenterology

## 2013-06-21 DIAGNOSIS — R109 Unspecified abdominal pain: Secondary | ICD-10-CM

## 2013-06-21 DIAGNOSIS — R1110 Vomiting, unspecified: Secondary | ICD-10-CM

## 2013-06-21 DIAGNOSIS — R110 Nausea: Secondary | ICD-10-CM

## 2013-07-05 NOTE — Progress Notes (Signed)
[facility] ??? [facility]  [address]  [place], [place] [address]  [phone]  [phone] fax    Physical Therapy Discharge Summary  Patient name: Laura Roman Start of Care: 02-09-13   Referral source: Alisa Graff, MD DOB: 1951-02-09   Medical/Treatment Diagnosis: Pain in joint, shoulder region [719.41] Onset Date:Oct 2013     Prior Hospitalization: see medical history Provider#: [id number]   Medications: Verified on Patient Summary List    Comorbidities: na  Prior Level of Function I with activities without limiation     Visits from Start of Care: 30    Missed Visits: 0      Summary of Care:  Patient was placed on hold while awaiting MRI. She was last seen 06-01-13 and is now being discharged from PT. ROM was making slow progress. PROM of the R shoulder as follows: flexion/scaption 140 degrees, ER 35 degrees, IR 55 degrees        ASSESSMENT/RECOMMENDATIONS:  [x] Discontinue therapy: [] Patient has reached or is progressing toward set goals      [] Patient is non-compliant or has abdicated      [] Due to lack of appreciable progress towards set goals    Kendal Hymen, PT 07/05/2013 4:09 PM

## 2013-08-25 NOTE — Progress Notes (Signed)
Pt given flu vaccine 0.5 mg IM in left arm with no difficulty. Pt tolerated injection well.    Leiann Sporer E Alivia Cimino LPN

## 2013-08-31 ENCOUNTER — Other Ambulatory Visit: Payer: Self-pay

## 2013-09-15 ENCOUNTER — Other Ambulatory Visit: Payer: Self-pay | Admitting: Neurology

## 2013-09-22 NOTE — Progress Notes (Signed)
HPI/History  Laura Roman is a 62 y.o. Caucasian female who presents for evaluation of sore throat. Began as scratchy throat about 4 days ago with some variable hoarseness and loss of voice. Possible tender nodes without enlargment, no throat pus/exudate. Afebrile at all times per home measurements. Some watery eyes and sneezing, mild intermittent aches and chills. No significant nasal, sinus, or ear symptoms. Occasional cough without production. No rashes, no other complaints.    Patient Active Problem List   Diagnosis Code   ??? Impaired fasting glucose 790.21   ??? Vitamin D deficiency 268.9   ??? DJD (degenerative joint disease) 715.90   ??? Anxiety 300.00   ??? Dyslipidemia 272.4     Past Medical History   Diagnosis Date   ??? 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 Multiple lung nodules      no change 10/06, 03/07, 03/08   ??? Anxiety state, unspecified    ??? Palpitations 2008     neg thallium 2008, nl holter 2008, echo nl lv/ef 65%/tr mr/dd/nl pasp   ??? Prediabetes    ??? Vitamin D deficiency    ??? Dyslipidemia 03/22/2012   ??? Compression fx, thoracic spine 2007     negative DEXA Dr. Parks Ranger   ??? Arthritis      Dr. Paulina Fusi, Dr. Parks Ranger   ??? Frozen shoulder      right Dr. Luci Bank     Past Surgical History   Procedure Laterality Date   ??? Hx hemorrhoidectomy       Dr. Derrell Lolling 2007   ??? Endoscopy, colon, diagnostic  2007     negative Dr Derrell Lolling   ??? Echo 2d adult  12/04     normal with EF 70%   ??? Stress test thallium study  2005     negative   ??? Vas carotid duplex bilateral  2004     negative   ??? [place] abd comp  9/05     negative   ??? Hx gyn       s/p BTL      History     Social History   ??? Marital Status: MARRIED     Spouse Name: N/A     Number of Children: 2   ??? 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 Smoking status: Never Smoker    ??? Smokeless tobacco: Not on file   ??? Alcohol Use: No   ??? Drug Use: No   ??? Sexually Active: Not on file     Other Topics Concern   ???  Not on file     Social History Narrative   ??? No narrative on file     Family History   Problem Relation Age of Onset   ??? Diabetes Mother    ??? Elevated Lipids Father    ??? Stroke Paternal Grandmother    ??? Heart Disease Paternal Grandfather      Current Outpatient Prescriptions   Medication Sig   ??? citalopram (CELEXA) 10 mg tablet take 1 tablet by mouth once daily   ??? VITAMIN D2 50,000 unit capsule take 1 capsule by mouth every week   ??? atorvastatin (LIPITOR) 40 mg tablet Take 1 Tab by mouth daily.     No current facility-administered medications for this visit.     Allergies   Allergen Reactions   ??? Fish  Oil Nausea Only   ??? Relafen [Nabumetone] Nausea Only       Review of Systems  Significant findings included in HPI.    Physical Examination  BP 104/62   Pulse 101   Temp(Src) 98.2 ??F (36.8 ??C) (Oral)   Resp 14   Ht 5\' 4"  (1.626 m)   Wt 166 lb (75.297 kg)   BMI 28.48 kg/m2   SpO2 96%    General - Alert, well appearing, and in no acute distress. Pt appears comfortable and in good spirits. Not anxious, non-diaphoretic.  Mental status - Appropriate mood, behavior, speech, dress, motor activity, and thought processes.  Eyes - No erythema or discharge.  Ears - Auditory canals appear normal.  TMs appear normal.  Nose - Good air movement. No erythema. Small amount of clear watery rhinorrhea.  Mouth - Mucous membranes moist, pharynx without lesions, swelling, erythema, or exudate. Mild hoarseness and loss of voice.  Neck - Supple without rigidity.  Lymph - No periauricular, perimandibular, or cervical tenderness or swelling.  Chest - No tachypnea, retractions, or cyanosis. Good respiratory effort. Clear to auscultation bilat.   Cardiovascular - Upper normal rate, regular rhythm. No murmurs or gallops noted.    Assessment and Plan  1. Pharyngitis - Appears viral and rapid strep negative, culture pending. Fluids, voice rest, and supportive measures, which were discussed at length.  Further planning pending cx and progression.        PLEASE NOTE:   This document has been produced using voice recognition software. Unrecognized errors in transcription may be present.    Etta Quill [employer] [facility] [place]  [phone]  09/22/2013

## 2013-09-22 NOTE — Progress Notes (Signed)
I reviewed the patient's medical history, the physician assistant's findings on physical examination, the patient's diagnoses, and treatment plan as documented in the progress note. I concur with the treatment plan as documented. There are no additional recommendations at this time.

## 2013-09-25 LAB — CULTURE, STREP THROAT: Beta Strep Gp A Culture: NEGATIVE

## 2013-09-25 LAB — STREP GP A AG, IA W/REFLEX: STREP GP A AG, IA W/REFLEX: NEGATIVE

## 2013-09-29 NOTE — Telephone Encounter (Signed)
LMTCB

## 2013-09-29 NOTE — Telephone Encounter (Signed)
Throat cx negative. Continue as discussed.

## 2013-10-03 NOTE — Telephone Encounter (Signed)
Patient aware per Karen Kitchens

## 2013-10-24 ENCOUNTER — Encounter: Payer: Self-pay | Admitting: Neurology

## 2013-11-03 LAB — METABOLIC PANEL, COMPREHENSIVE
A-G Ratio: 1.6 CALC (ref 0.6–2.2)
ALT (SGPT): 11 U/L (ref 5–30)
AST (SGOT): 13 U/L (ref 7–31)
Albumin: 3.9 g/dL (ref 3.5–5.0)
Alk. phosphatase: 73 U/L (ref 42–98)
BUN/Creatinine ratio: 13.2 CALC (ref 8.0–36.0)
BUN: 9 mg/dL (ref 8–26)
Bilirubin, total: 0.6 mg/dL (ref 0.2–1.3)
CO2: 27 mEq/L (ref 24.0–31.0)
Calcium: 9 mg/dL (ref 8.5–10.5)
Chloride: 107 mEq/L (ref 101–110)
Creatinine: 0.68 mg/dL (ref 0.60–1.20)
GFR est AA: >60 mL/min/{1.73_m2} (ref >=60)
GFR est non-AA: >60 mL/min/{1.73_m2} (ref >=60)
GLOBULIN, TOTAL: 2.4 g/dL (ref 2.0–4.8)
Glucose: 113 mg/dL — ABNORMAL HIGH (ref 65–99)
Potassium: 3.9 mEq/L (ref 3.5–5.1)
Protein, total: 6.3 g/dL — ABNORMAL LOW (ref 6.4–8.3)
Sodium: 143 mEq/L (ref 135–145)

## 2013-11-03 LAB — LIPID PANEL
Cholesterol, total: 146 mg/dL (ref 100–199)
HDL Cholesterol: 43 mg/dL (ref 40–78)
LDL, calculated: 77 mg/dL (calc) (ref 0–160)
Triglyceride: 130 mg/dL (ref 0–149)
VLDL, calculated: 26 mg/dL (calc)

## 2013-11-03 LAB — CVD REPORT: PDF IMAGE: 0

## 2013-11-03 LAB — HEMOGLOBIN A1C WITH EAG: Hemoglobin A1c: 6.2 % — ABNORMAL HIGH (ref 4.8–5.6)

## 2013-11-10 ENCOUNTER — Ambulatory Visit: Payer: Self-pay | Admitting: Neurology

## 2013-11-13 NOTE — Progress Notes (Signed)
63 year old white female who presents for f/u    The FSS is better after PT, cortisone shots through Dr Morley Kos    Denies any GI or GU complaints.    FBS <110, not checking pps.  Denies polyuria, polydipsia, nocturia, vision change.      Vitals 11/15/2013 09/22/2013 03/27/2013 09/19/2012 03/22/2012   Weight 168 166 163 164 156.5   Height '5\' 4"'  '5\' 4"'  '5\' 4"'  '5\' 4"'  '5\' 4"'    BMI 28.82 28.48 27.97 28.14 26.85     Past Medical History   Diagnosis Date   ??? 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 Multiple lung nodules      no change 10/06, 03/07, 03/08   ??? Anxiety state, unspecified    ??? Palpitations 2008     neg thallium 2008, nl holter 2008, echo nl lv/ef 65%/tr mr/dd/nl pasp   ??? Prediabetes    ??? Vitamin D deficiency    ??? Dyslipidemia    ??? Compression fx, thoracic spine 2007     negative DEXA Dr. Marisa Hua   ??? Arthritis      Dr. Synetta Shadow, Dr. Marisa Hua   ??? Frozen shoulder      right Dr. Wynonia Hazard MRI     Past Surgical History   Procedure Laterality Date   ??? Hx hemorrhoidectomy       Dr. Rosendo Gros 2007   ??? Endoscopy, colon, diagnostic  2007     negative Dr Rosendo Gros   ??? Echo 2d adult  12/04     normal with EF 70%   ??? Stress test thallium study  2005     negative   ??? Vas carotid duplex bilateral  2004     negative   ??? [place] abd comp  9/05     negative   ??? Hx gyn       s/p BTL      History     Social History   ??? Marital Status: MARRIED     Spouse Name: N/A     Number of Children: 2   ??? 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 Smoking status: Never Smoker    ??? Smokeless tobacco: Not on file   ??? Alcohol Use: No   ??? Drug Use: No   ??? Sexually Active: Not on file     Other Topics Concern   ??? Not on file     Social History Narrative   ??? No narrative on file     Current Outpatient Prescriptions   Medication Sig   ??? citalopram (CELEXA) 10 mg tablet take 1 tablet by mouth once daily   ??? VITAMIN D2 50,000 unit capsule take 1 capsule by mouth every week   ??? atorvastatin  (LIPITOR) 40 mg tablet Take 1 Tab by mouth daily.     No current facility-administered medications for this visit.     Allergies   Allergen Reactions   ??? Fish Oil Nausea Only   ??? Relafen [Nabumetone] Nausea Only     REVIEW OF SYSTEMS: last gyn 2012 Dr. Gunnar Bulla in [place], [place] 6/14, colo 2007 Dr Rosendo Gros, [place] 2007? Dr Margaretann Loveless ??? no vision change or eye pain  Oral ??? no mouth pain, tongue or tooth problems  Ears ??? no hearing loss, ear pain, fullness, no swallowing problems  Cardiac ??? no CP, PND, orthopnea,  edema, palpitations or syncope  Chest ??? no breast masses  Resp ??? no wheezing, chronic coughing, dyspnea  Urinary ??? no dysuria, hematuria, flank pain, urgency, frequency  Ortho ??? no swelling, dec ROM, myalgias  Psych ??? denies any anxiety or depression symptoms, no hallucinations or violent ideation  Endo - no polyuria, polydipsia, nocturia, hot flashes    Visit Vitals   Item Reading   ??? BP 122/84   ??? Pulse 64   ??? Temp(Src) 97.8 ??F (36.6 ??C) (Oral)   ??? Ht '5\' 4"'  (1.626 m)   ??? Wt 168 lb (76.204 kg)   ??? BMI 28.82 kg/m2     Affect is appropriate.  Mood stable  No apparent distress  HEENT --Anicteric sclerae, tympanic membranes normal,  ear canals normal.  Sinuses were nontender, turbinates normal, hearing normal.  Oropharynx without  erythema, normal tongue, oral mucosa and tonsils.  No thyromegaly, JVD, or bruits.    Lungs --Clear to auscultation and percussion, normal percussion.  Heart --Regular rate and rhythm, no murmurs, rubs, gallops, or clicks.  Abdomen -- Soft and nontender, no hepatosplenomegaly or masses.  Extremities -- Without cyanosis, clubbing, edema. 2+ pulses equally and bilaterally.    LABS  From 5/10 showed   gluc 110, cr 0.70,               alt 23,                                    chol 154, tg 143, hdl 45, ldl-c 80,                                                            tsh 1.91  From 5/10 showed                      2 hr GTT 89  From 8/10 showed                                                                vit d 23.0, ck 57, aldo 5.4  From 8/11 showed   gluc 108,                                      hba1c 6.3,                   chol 160, tg 172, hdl 39, ldl-c 87,  wbc 5.,7 hb 12.3, plt 235, ua neg,    tsh 2.28  From 5/12 showed   gluc 113, cr 0.71,  gfr 94,  alt 16, hba1c 6.2, ldl-p 1989, chol 177, tg 161, hdl 43, ldl-c 102  From 11/12 showed  hba1c 5.9, ldl-p 1218, chol 133, tg 116, hdl 45, ldl-c 65  From 5/13 showed   gluc 105, cr 0.55, gfr 102, alt 8,   hba1c 6.2,                   chol 148, tg 107, hdl 45, ldl-c 82,  wbc 5.0, hb 12.5, plt 172, vit d 40.3  From 11/13 showed         hba1c 6.2, ldl-p 1888, chol 176, tg 155, hdl 49, ldl-c 86,  wbc 6.2, hb 12.3, plt 182, vit d 34.4  From 5/14 showed         hba1c 6.2,      chol 152, tg 157, hdl 39, ldl-c 82    Results for orders placed in visit on 11/02/13   HEMOGLOBIN A1C       Result Value Range    Hemoglobin A1c 6.2 (*) 4.8 - 5.6 %   LIPID PANEL       Result Value Range    Cholesterol, total 146  100 - 199 mg/dL    Triglyceride 130  0 - 149 mg/dL    HDL Cholesterol 43  40 - 78 mg/dL    VLDL, calculated 26      LDL, calculated 77  0 - 160 mg/dL (calc)   METABOLIC PANEL, COMPREHENSIVE       Result Value Range    Glucose 113 (*) 65 - 99 mg/dL    BUN 9  8 - 26 mg/dL    Creatinine 0.68  0.60 - 1.20 mg/dL    GFR est non-AA >60  >=60 mL/min/1.60m   GFR est AA >60  >=60 mL/min/1.779m  BUN/Creatinine ratio 13.2  8.0 - 36.0 CALC    Sodium 143  135 - 145 mEq/L    Potassium 3.9  3.5 - 5.1 mEq/L    Chloride 107  101 - 110 mEq/L    CO2 27.0  24.0 - 31.0 mEq/L    Calcium 9.0  8.5 - 10.5 mg/dL    Protein, total 6.3 (*) 6.4 - 8.3 g/dL    Albumin 3.9  3.5 - 5.0 g/dL    GLOBULIN, TOTAL 2.4  2.0 - 4.8 g/dL    A-G Ratio 1.6  0.6 - 2.2 CALC    Bilirubin, total 0.6  0.2 - 1.3 mg/dL    Alk. phosphatase 73  42 - 98 U/L    AST 13  7 - 31 U/L    ALT 11  5 - 30 U/L   CVD REPORT       Result Value Range    INTERPRETATION Note       PDF IMAGE .       Patient Active Problem List   Diagnosis Code   ??? Impaired fasting glucose 790.21   ??? Vitamin D deficiency 268.9   ??? DJD (degenerative joint disease) 715.90   ??? Anxiety 300.00   ??? Dyslipidemia 272.4     Assessment and plan:  1. Prediabetes. Lifestyle and dietary modification reiterated. Weight loss would be ideal  2. Hyperlipidemia. Continue current  3. Hypovitaminosis D. Continue supp  4. Anxiety.  Continue celexa  5. Overweight.  Not interested in appetite suppressants at last visit and again today  6. FSS.  F/U PT and Dr. FiMorley Kos      RTC 7/15

## 2013-11-14 NOTE — Progress Notes (Signed)
Reviewed chart in preparation of office visit and have obtained necessary documentation.

## 2013-11-30 ENCOUNTER — Encounter: Payer: Self-pay | Admitting: Cardiovascular Disease

## 2013-12-11 ENCOUNTER — Ambulatory Visit: Payer: Medicare HMO | Admitting: Cardiovascular Disease

## 2013-12-14 ENCOUNTER — Ambulatory Visit (INDEPENDENT_AMBULATORY_CARE_PROVIDER_SITE_OTHER): Payer: Managed Care, Other (non HMO) | Admitting: Neurology

## 2013-12-14 ENCOUNTER — Encounter: Payer: Self-pay | Admitting: Neurology

## 2013-12-14 VITALS — BP 144/80 | HR 79 | Wt 172.0 lb

## 2013-12-14 DIAGNOSIS — R413 Other amnesia: Secondary | ICD-10-CM

## 2013-12-14 DIAGNOSIS — R269 Unspecified abnormalities of gait and mobility: Secondary | ICD-10-CM

## 2013-12-14 HISTORY — DX: Other amnesia: R41.3

## 2013-12-14 MED ORDER — DONEPEZIL HCL 10 MG PO TABS: 10.0000 mg | ORAL_TABLET | Freq: Every day | ORAL | Status: DC

## 2013-12-14 MED ORDER — VENLAFAXINE HCL ER 75 MG PO CP24: 75.0000 mg | ORAL_CAPSULE | Freq: Every day | ORAL | Status: DC

## 2013-12-14 NOTE — Progress Notes (Signed)
Reason for visit: Memory disorder  Mia [name] is an 63 y.o. female  History of present illness:  Mia [name] is a 63 year old right-handed white female with a history of a memory disorder that has been present for 4 or 5 years. The patient was last seen through this office on 08/08/2012. MRI evaluation was done for the memory disorder in December of 2011, and this showed evidence of mild small vessel changes. The patient has seronegative rheumatoid arthritis, and a history of fibromyalgia. The patient reports some recent issues with word finding problems, getting words mixed up. The patient has mild problems with directions while driving, but she still continues to drive. The patient has had some mild gait instability, and she will fall on occasion. The patient required recent surgery on the right shoulder for rotator cuff tear and a partial biceps tendon tear. The patient returns to this office for further evaluation. The patient is on low-dose Aricept, and she is tolerating the medication well.  Past Medical History  Diagnosis Date  . SVT (supraventricular tachycardia)   . Sleep apnea   . Raynaud phenomenon   . Edema   . Anemia   . Arthritis   . Blood transfusion   . Diabetes mellitus   . GERD (gastroesophageal reflux disease)     gastritis  . Hypertension   . Neuromuscular disorder     sclerosis  . Neuromuscular disorder     fibromyalgia  . Pseudogout   . Thyroid disease     hypothyroidism  . Hemorrhoids   . Rectal bleeding   . PONV (postoperative nausea and vomiting)   . Memory difficulty 12/14/2013  . Fibromyalgia     Past Surgical History  Procedure Laterality Date  . Cholecystectomy    . Appendectomy    . Tonsillectomy    . Hysterectomy - unknown type    . Knee surgery      x6  . Oophorectomy    . Dilation and curettage of uterus    . Breast biopsy    . Shoulder surgery      bilateral- bones spur  . Neck surgery      fusion  . Quadriceps repair      right   . Bladder surgery      x2  . Cataract extraction      right  . Rectocele repair      x2  . Enterocele repair      x2  . Cardiac catheterization    . Eye surgery      retina  . Shoulder arthroscopy with subacromial decompression, rotator cuff repair and bicep tendon repair  10/06/2012    Procedure: SHOULDER ARTHROSCOPY WITH SUBACROMIAL DECOMPRESSION, ROTATOR CUFF REPAIR AND BICEP TENDON REPAIR;  Surgeon: Nita Sells, MD;  Location: [facility];  Service: Orthopedics;  Laterality: Right;  Arthroscopic  Repair  of  Subscapularis, Open Biceps Tenodesis    Family History  Problem Relation Age of Onset  . Heart attack    . Stroke    . Diabetes    . Cancer Maternal Grandmother     breast  . Cancer Maternal Grandfather     pancreatic  . Heart disease Mother   . Heart disease Father   . Pulmonary fibrosis Sister   . Stroke Sister   . Lupus Sister     Social history:  reports that she has never smoked. She has never used smokeless tobacco. She reports that she does  not drink alcohol or use illicit drugs.    Allergies  Allergen Reactions  . Percocet [Oxycodone-Acetaminophen] Nausea And Vomiting  . Codeine   . Hydrocodone Nausea And Vomiting  . Nitrofurantoin   . Oxycodone-Acetaminophen Nausea And Vomiting  . Verapamil     REACTION: intolerance  . Darvocet [Propoxyphene N-Acetaminophen] Nausea And Vomiting    Medications:  Current Outpatient Prescriptions on File Prior to Visit  Medication Sig Dispense Refill  . calcium carbonate (OS-CAL) 600 MG TABS Take 600 mg by mouth 2 (two) times daily with a meal.        . cetirizine (ZYRTEC) 10 MG tablet Take 10 mg by mouth daily.      . diclofenac sodium (VOLTAREN) 1 % GEL Apply topically as needed.      . diltiazem (CARDIZEM CD) 240 MG 24 hr capsule Take 1 capsule (240 mg total) by mouth daily.  90 capsule  3  . fluticasone (FLONASE) 50 MCG/ACT nasal spray 2 sprays by Nasal route daily.        . folic  acid (FOLVITE) 1 MG tablet Take 1 mg by mouth 2 (two) times daily.       . furosemide (LASIX) 20 MG tablet Take 20 mg by mouth daily.        . metFORMIN (GLUCOPHAGE) 1000 MG tablet Take 500 mg by mouth daily.       . Meth-Hyo-M Bl-Na Phos-Ph Sal (URIBEL PO) Take by mouth as needed.        . methotrexate 1 G injection Inject 0.8 mg into the muscle once a week.       . Multiple Vitamin (MULTIVITAMIN) capsule Take 1 capsule by mouth daily.      . nitroGLYCERIN (NITROSTAT) 0.4 MG SL tablet Place 1 tablet (0.4 mg total) under the tongue every 5 (five) minutes as needed for chest pain.  25 tablet  3  . pantoprazole (PROTONIX) 40 MG tablet Take 40 mg by mouth daily.      . pentosan polysulfate (ELMIRON) 100 MG capsule Take 100 mg by mouth 3 (three) times daily before meals.        . SUMAtriptan (IMITREX) 50 MG tablet Take 50 mg by mouth every 2 (two) hours as needed.      . vitamin D, CHOLECALCIFEROL, 400 UNITS tablet Take 400 Units by mouth 2 (two) times daily.         No current facility-administered medications on file prior to visit.    ROS:  Out of a complete 14 system review of symptoms, the patient complains only of the following symptoms, and all other reviewed systems are negative.  Fatigue Cold intolerance, excessive thirst Palpitations of the heart Apnea, frequent waking Incontinence of bladder, frequency of urination Joint pain, joint swelling, achy muscles, coordination problems Memory loss, speech problems  Blood pressure 144/80, pulse 79, weight 172 lb (78.019 kg).  Physical Exam  General: The patient is alert and cooperative at the time of the examination.  Skin: No significant peripheral edema is noted.   Neurologic Exam  Mental status: Mini-Mental status examination done today shows a total score of 29/30  Cranial nerves: Facial symmetry is present. Speech is normal, no aphasia or dysarthria is noted. Extraocular movements are full. Visual fields are full.  Motor:  The patient has good strength in all 4 extremities.  Sensory examination: Soft touch sensation is symmetric on the face, arms, and legs.  Coordination: The patient has good finger-nose-finger and heel-to-shin bilaterally.  Gait and  station: The patient has a normal gait. Tandem gait is unsteady. Romberg is positive. No drift is seen.  Reflexes: Deep tendon reflexes are symmetric.   Assessment/Plan:  1. Memory disturbance  2. Mild gait instability  The patient will be increased on Aricept taking 10 mg at night. The patient will continue the Effexor for the fibromyalgia discomfort. The patient will followup through this office in 8 or 9 months.  Jill Alexanders MD 12/14/2013 8:36 PM  [facility] [address] [place], [place] [address]  Phone [phone] Fax [phone]

## 2013-12-14 NOTE — Patient Instructions (Signed)

## 2014-01-15 ENCOUNTER — Ambulatory Visit (INDEPENDENT_AMBULATORY_CARE_PROVIDER_SITE_OTHER): Payer: Medicare HMO | Admitting: Cardiovascular Disease

## 2014-01-15 ENCOUNTER — Encounter: Payer: Self-pay | Admitting: Cardiovascular Disease

## 2014-01-15 VITALS — BP 130/80 | HR 76 | Ht 66.5 in | Wt 170.4 lb

## 2014-01-15 DIAGNOSIS — I10 Essential (primary) hypertension: Secondary | ICD-10-CM

## 2014-01-15 DIAGNOSIS — I519 Heart disease, unspecified: Secondary | ICD-10-CM

## 2014-01-15 DIAGNOSIS — I471 Supraventricular tachycardia: Secondary | ICD-10-CM

## 2014-01-15 DIAGNOSIS — I498 Other specified cardiac arrhythmias: Secondary | ICD-10-CM

## 2014-01-15 MED ORDER — DILTIAZEM HCL ER COATED BEADS 360 MG PO CP24: 360.0000 mg | ORAL_CAPSULE | Freq: Every day | ORAL | Status: DC

## 2014-01-15 NOTE — Assessment & Plan Note (Signed)
Mia [name] has been having several episodes of supraventricular tachycardia. These typically last for an hour or so. Her with rest and with exertion.  She's been on diltiazem with when necessary propranolol. She has a history of Raynaud's phenomenon and and we have been avoiding daily metoprolol.  We'll increase the diltiazem to 360 mg a day. She'll continue with the propranolol as needed. If she continues to have episodes of SVT, we will place a an event monitor on her for further diagnosis. Ultimately, she may need referral to electrophysiology for consideration for ablation.

## 2014-01-15 NOTE — Progress Notes (Signed)
Mia [name] Date of Birth  1950-12-27 [facility] [address]     [place], [place]  [address] [phone]  Fax  [phone]   Problems 1. SVT 2. Raynaud's Phenomenon 3. Diabetes Mellitus 4. Minimal CAD by cath 2006 [name]) 5. Sleep apnea - CPAP is currently not working  02/11/05 Right heart catheterization:  RA pressure: 8 mmHg  RV pressure: 25/7 mmHg  PA pressure: 20/9 mmHg  PWCP: 9 mmHg  CO: 4.6 liters per minute  LEFT HEART CATHETERIZATION RESULTS:  1. Left main: No significant disease.  2. LAD: Mild luminal irregularities.  3. First and second diagonal: Moderate size, mild luminal irregularities.  4. Left circumflex: Nondominant, mild luminal irregularities.  5. RCA: Dominant with mild luminal irregularities.  6. LV: EF is 55 to 60%, no wall motion abnormalities. LV EDP was 9 mmHg.   History of Present Illness:  Pt has a history of SVT, sleep apnea, normal coronaries, Raynaud's phenomen.   Exercising some.  Limited by ruptured disc in her neck.  She had a falling accident and injured her right shoulder.  She has had a few  episodes of SVT since I last saw her.  She has been walking and has some dyspnea when walking up hills.  January 24, 2013:  Mia [name] complains of a fast HR recently.  She has continued to have some dyspnea.    She was vacationing in [place] - she developed strep throat.  .  She has not been able to lie flat while sleeping recently because of    2 pillow orthopnea .  She also has severe DOE with sweeping the kitchen.   Complains of weight in lower chest .  She has severe dyspnea getting out of the shower.   She had right rotator cuff repair and biceps repair several months ago.    January 15, 2014:  Mia [name] has continued to have some episodes of tachycardia - HR of 198 at times.  These occur with rest and with walking on the treadmill.    These are associated with fairly significant dyspnea.   She  took a propranolol which seems to have helped.  The worse episode lasted several hours.  Denied any cp.      Current Outpatient Prescriptions  Medication Sig Dispense Refill  . B-D TB SYRINGE 1CC/27GX1/2" 27G X 1/2" 1 ML MISC Inject 1 each into the muscle once a week.      . baclofen (LIORESAL) 10 MG tablet Take 10 mg by mouth daily after breakfast.      . calcium carbonate (OS-CAL) 600 MG TABS Take 600 mg by mouth 2 (two) times daily with a meal.        . cetirizine (ZYRTEC) 10 MG tablet Take 10 mg by mouth daily.      . cyclobenzaprine (FLEXERIL) 10 MG tablet Take 10 mg by mouth at bedtime.      . diclofenac sodium (VOLTAREN) 1 % GEL Apply topically as needed.      . diltiazem (CARDIZEM CD) 240 MG 24 hr capsule Take 1 capsule (240 mg total) by mouth daily.  90 capsule  3  . donepezil (ARICEPT) 10 MG tablet Take 1 tablet (10 mg total) by mouth at bedtime.  30 tablet  5  . fluticasone (FLONASE) 50 MCG/ACT nasal spray 2 sprays by Nasal route daily.        . folic acid (FOLVITE) 1 MG tablet Take 1 mg  by mouth 2 (two) times daily.       . furosemide (LASIX) 20 MG tablet Take 20 mg by mouth daily.        . metFORMIN (GLUCOPHAGE) 1000 MG tablet Take 500 mg by mouth daily.       . Meth-Hyo-M Bl-Na Phos-Ph Sal (URIBEL PO) Take by mouth as needed.        . methotrexate 1 G injection Inject 0.8 mg into the muscle once a week.       . Multiple Vitamin (MULTIVITAMIN) capsule Take 1 capsule by mouth daily.      . nitroGLYCERIN (NITROSTAT) 0.4 MG SL tablet Place 1 tablet (0.4 mg total) under the tongue every 5 (five) minutes as needed for chest pain.  25 tablet  3  . pantoprazole (PROTONIX) 40 MG tablet Take 40 mg by mouth daily.      . pentosan polysulfate (ELMIRON) 100 MG capsule Take 100 mg by mouth 3 (three) times daily before meals.        . predniSONE (DELTASONE) 5 MG tablet Take 5 mg by mouth daily.      . propranolol (INDERAL) 10 MG tablet Take 10 mg by mouth 3 (three) times daily as needed.        . sulfaSALAzine (AZULFIDINE) 500 MG EC tablet Take 1,000 mg by mouth 2 (two) times daily.       . SUMAtriptan (IMITREX) 50 MG tablet Take 50 mg by mouth every 2 (two) hours as needed.      . venlafaxine XR (EFFEXOR-XR) 75 MG 24 hr capsule Take 1 capsule (75 mg total) by mouth daily with breakfast.  90 capsule  1  . vitamin D, CHOLECALCIFEROL, 400 UNITS tablet Take 400 Units by mouth 2 (two) times daily.         No current facility-administered medications for this visit.     Allergies  Allergen Reactions  . Percocet [Oxycodone-Acetaminophen] Nausea And Vomiting  . Codeine   . Hydrocodone Nausea And Vomiting  . Nitrofurantoin   . Oxycodone-Acetaminophen Nausea And Vomiting  . Verapamil     REACTION: intolerance  . Darvocet [Propoxyphene N-Acetaminophen] Nausea And Vomiting    Past Medical History  Diagnosis Date  . SVT (supraventricular tachycardia)   . Sleep apnea   . Raynaud phenomenon   . Edema   . Anemia   . Arthritis   . Blood transfusion   . Diabetes mellitus   . GERD (gastroesophageal reflux disease)     gastritis  . Hypertension   . Neuromuscular disorder     sclerosis  . Neuromuscular disorder     fibromyalgia  . Pseudogout   . Thyroid disease     hypothyroidism  . Hemorrhoids   . Rectal bleeding   . PONV (postoperative nausea and vomiting)   . Memory difficulty 12/14/2013  . Fibromyalgia     Past Surgical History  Procedure Laterality Date  . Cholecystectomy    . Appendectomy    . Tonsillectomy    . Hysterectomy - unknown type    . Knee surgery      x6  . Oophorectomy    . Dilation and curettage of uterus    . Breast biopsy    . Shoulder surgery      bilateral- bones spur  . Neck surgery      fusion  . Quadriceps repair      right  . Bladder surgery      x2  . Cataract extraction  right  . Rectocele repair      x2  . Enterocele repair      x2  . Cardiac catheterization    . Eye surgery      retina  . Shoulder arthroscopy with  subacromial decompression, rotator cuff repair and bicep tendon repair  10/06/2012    Procedure: SHOULDER ARTHROSCOPY WITH SUBACROMIAL DECOMPRESSION, ROTATOR CUFF REPAIR AND BICEP TENDON REPAIR;  Surgeon: Nita Sells, MD;  Location: [facility];  Service: Orthopedics;  Laterality: Right;  Arthroscopic  Repair  of  Subscapularis, Open Biceps Tenodesis    History  Smoking status  . Never Smoker   Smokeless tobacco  . Never Used    History  Alcohol Use No    Family History  Problem Relation Age of Onset  . Heart attack    . Stroke    . Diabetes    . Cancer Maternal Grandmother     breast  . Cancer Maternal Grandfather     pancreatic  . Heart disease Mother   . Heart disease Father   . Pulmonary fibrosis Sister   . Stroke Sister   . Lupus Sister     Reviw of Systems:  Reviewed in the HPI.  All other systems are negative.  Physical Exam: BP 130/80  Pulse 76  Ht 5' 6.5" (1.689 m)  Wt 170 lb 6.4 oz (77.293 kg)  BMI 27.09 kg/m2 The patient is alert and oriented x 3.  The mood and affect are normal.  The skin is warm and dry.  Color is normal.  The HEENT exam reveals that the sclera are nonicteric.  The mucous membranes are moist.  The carotids are 2+ without bruits.  There is no thyromegaly.  There is no JVD.  The lungs are clear.  The chest wall is non tender.  The heart exam reveals a regular rate with a normal S1 and S2.  There are no murmurs, gallops, or rubs.  The PMI is not displaced.   Abdominal exam reveals good bowel sounds.  There is no guarding or rebound.  There is no hepatosplenomegaly or tenderness.  There are no masses.  Exam of the legs reveal no clubbing, cyanosis, or edema.  The legs are without rashes.  The distal pulses are intact.  Cranial nerves II - XII are intact.  Motor and sensory functions are intact.  The gait is normal.  ECG: March 23 ,2015:  NSR at 106.  Normal     Assessment / Plan:

## 2014-01-15 NOTE — Patient Instructions (Signed)
Your physician wants you to follow-up in: 6 MONTHS   You will receive a reminder letter in the mail two months in advance. If you don't receive a letter, please call our office to schedule the follow-up appointment.   Your physician recommends that you return for a FASTING lipid profile: TODAY   Your physician has recommended you make the following change in your medication:  INCREASE DILTIAZEM TO 360 MG DAILY

## 2014-01-16 LAB — HEPATIC FUNCTION PANEL
ALT: 30 U/L (ref 0–35)
AST: 27 U/L (ref 0–37)
Albumin: 4.4 g/dL (ref 3.5–5.2)
Alkaline Phosphatase: 76 U/L (ref 39–117)
Bilirubin, Direct: 0.1 mg/dL (ref 0.0–0.3)
Total Bilirubin: 0.6 mg/dL (ref 0.3–1.2)
Total Protein: 6.7 g/dL (ref 6.0–8.3)

## 2014-01-16 LAB — LIPID PANEL
Cholesterol: 178 mg/dL (ref 0–200)
HDL: 57.3 mg/dL (ref >39.00)
LDL Cholesterol: 107 mg/dL — ABNORMAL HIGH (ref 0–99)
Total CHOL/HDL Ratio: 3
Triglycerides: 67 mg/dL (ref 0.0–149.0)
VLDL: 13.4 mg/dL (ref 0.0–40.0)

## 2014-01-16 LAB — BASIC METABOLIC PANEL
BUN: 19 mg/dL (ref 6–23)
CO2: 26 mEq/L (ref 19–32)
Calcium: 9.9 mg/dL (ref 8.4–10.5)
Chloride: 102 mEq/L (ref 96–112)
Creatinine, Ser: 1 mg/dL (ref 0.4–1.2)
GFR: 61.77 mL/min (ref >60.00)
Glucose, Bld: 91 mg/dL (ref 70–99)
Potassium: 3.8 mEq/L (ref 3.5–5.1)
Sodium: 137 mEq/L (ref 135–145)

## 2014-01-16 NOTE — Addendum Note (Signed)
Addended by: Eulis Foster on: 01/16/2014 09:56 AM   Modules accepted: Orders

## 2014-01-16 NOTE — Addendum Note (Signed)
Addended by: BOWDEN, ROBIN K on: 01/16/2014 09:56 AM   Modules accepted: Orders  

## 2014-01-29 ENCOUNTER — Other Ambulatory Visit: Payer: Self-pay

## 2014-01-29 MED ORDER — DONEPEZIL HCL 10 MG PO TABS: 10.0000 mg | ORAL_TABLET | Freq: Every day | ORAL | Status: DC

## 2014-01-29 NOTE — Telephone Encounter (Signed)
Pharmacy requests 90 day Rx  

## 2014-01-31 ENCOUNTER — Ambulatory Visit (INDEPENDENT_AMBULATORY_CARE_PROVIDER_SITE_OTHER): Payer: Medicare HMO

## 2014-01-31 ENCOUNTER — Ambulatory Visit (INDEPENDENT_AMBULATORY_CARE_PROVIDER_SITE_OTHER): Payer: Medicare HMO | Admitting: Sports Medicine

## 2014-01-31 ENCOUNTER — Encounter: Payer: Self-pay | Admitting: Sports Medicine

## 2014-01-31 VITALS — BP 146/75 | HR 99 | Ht 66.0 in | Wt 165.0 lb

## 2014-01-31 DIAGNOSIS — IMO0002 Reserved for concepts with insufficient information to code with codable children: Secondary | ICD-10-CM

## 2014-01-31 DIAGNOSIS — M7051 Other bursitis of knee, right knee: Secondary | ICD-10-CM

## 2014-01-31 DIAGNOSIS — M1710 Unilateral primary osteoarthritis, unspecified knee: Secondary | ICD-10-CM

## 2014-01-31 DIAGNOSIS — Z96651 Presence of right artificial knee joint: Secondary | ICD-10-CM | POA: Insufficient documentation

## 2014-01-31 NOTE — Progress Notes (Signed)
Patient ID: Mia [name], female   DOB: October 02, 1951, 63 y.o.   MRN: [id number]   Subjective:    I'm seeing this patient as a consultation for:  Dr. Lynne Logan  CC:  Right knee pain x 2 weeks  HPI:  Patient is a 63 yo woman w/ PMH significant for RA, OA, pseudogout, and multiple bilateral knee surgeries including right quadriceps reconstruction who presents with 2 weeks of right knee pain of acute onset.  The pain began abruptly as she planted her foot while walking up a ramp.  The pain is located on the anteromedial aspect of her right leg just below the knee.  The pain is worst when bearing weight and is only relieved by lying flat and completely relieving all pressure.  Aleve and ice have not relieved pain.  The patients states that this pain is not similar to pain she has had previously with her RA, pseudogout, or OA.  Patient denies joint redness/warmth, fever, n/v.    Past medical history, Surgical history, Family history not pertinant except as noted below, Social history, Allergies, and medications have been entered into the medical record, reviewed, and no changes needed.   Review of Systems: No headache, visual changes, nausea, vomiting, diarrhea, constipation, dizziness, abdominal pain, skin rash, fevers, chills, night sweats, weight loss, swollen lymph nodes, body aches, joint swelling, muscle aches, chest pain, shortness of breath, mood changes, visual or auditory hallucinations.   Objective:   General: Well Developed, well nourished, and in no acute distress.  Neuro/Psych: Alert and oriented x3, extra-ocular muscles intact, able to move all 4 extremities, sensation grossly intact. Skin: Warm and dry, no rashes noted.  Respiratory: Not using accessory muscles, speaking in full sentences, trachea midline.  Cardiovascular: Pulses palpable, no extremity edema. Abdomen: Does not appear distended. Right Knee: Normal to inspection with no erythema or effusion or obvious bony  abnormalities. Palpation positive for joint line tenderness medially, point tenderness over the pes anserinus bersa.  No warmth, patellar tenderness, or condyle tenderness.   ROM full in flexion and extension and lower leg rotation. Ligaments with solid consistent endpoints including ACL, PCL, LCL, MCL. Negative Mcmurray's, Apley's, and Thessalonian tests. Non painful patellar compression. Patellar glide without crepitus. Patellar and quadriceps tendons unremarkable. Hamstring and quadriceps strength is normal.  Procedure: Real-time Ultrasound Guided Injection of right pes anserine bursa Device: GE Logiq E  Verbal informed consent obtained.  Time-out conducted.  Noted no overlying erythema, induration, or other signs of local infection.  Skin prepped in a sterile fashion.  Local anesthesia: Topical Ethyl chloride.  With sterile technique and under real time ultrasound guidance:  25-gauge needle advanced into a bursa which did not appear to be distended by fluid, 1 cc Kenalog 40, 1 cc lidocaine injected easily. Completed without difficulty  There was partial relief of her pain after this injection. Advised to call if fevers/chills, erythema, induration, drainage, or persistent bleeding.  Images permanently stored and available for review in the ultrasound unit.  Impression: Technically successful ultrasound guided injection.  Procedure: Real-time Ultrasound Guided Injection of right knee Device: GE Logiq E  Verbal informed consent obtained.  Time-out conducted.  Noted no overlying erythema, induration, or other signs of local infection.  Skin prepped in a sterile fashion.  Local anesthesia: Topical Ethyl chloride.  With sterile technique and under real time ultrasound guidance:  1 cc Kenalog 40, 4 cc lidocaine injected easily into the suprapatellar recess. Completed without difficulty  Pain immediately resolved suggesting accurate  placement of the medication.  Advised to call if  fevers/chills, erythema, induration, drainage, or persistent bleeding.  Images permanently stored and available for review in the ultrasound unit.  Impression: Technically successful ultrasound guided injection.  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  Impression and Recommendations:   This case required medical decision making of moderate complexity.  Given the acute onset and focal nature of the patient's pain over the anserine bursa, it is very likely that this patient has anserine bursitis.  There is also a component of OA given the patient's description of pain with palpaption of the joint line and pain with McMurray's test.    -pes anserine bursa steroid injection -joint space steroid injection -baseline x-rays obtained today

## 2014-01-31 NOTE — Addendum Note (Signed)
Addended by: Silverio Decamp on: 01/31/2014 03:24 PM   Modules accepted: Level of Service

## 2014-01-31 NOTE — Assessment & Plan Note (Addendum)
There is osteoarthritis, as well as pes bursitis. Injections placed into the joint, as well as into the pes bursa. Home rehabilitation. Base line x-rays. Return in one month.

## 2014-02-01 ENCOUNTER — Telehealth: Payer: Self-pay | Admitting: *Deleted

## 2014-02-01 NOTE — Telephone Encounter (Signed)
Pt calls and states seen yesterday and given injection in knee and then went to xray, by the time she got to xray she was limping again and now its hurting so bad she can not put any weight on her leg and can not walk.  Please advise

## 2014-02-01 NOTE — Telephone Encounter (Signed)
Pt notified of MD instructions. Clemetine Marker, LPN

## 2014-02-01 NOTE — Telephone Encounter (Signed)
Ice for 20 minutes 3-4 times a day, and give it a couple of days, this is not uncommon.

## 2014-02-05 ENCOUNTER — Telehealth: Payer: Self-pay | Admitting: *Deleted

## 2014-02-05 ENCOUNTER — Ambulatory Visit (INDEPENDENT_AMBULATORY_CARE_PROVIDER_SITE_OTHER): Payer: Medicare HMO | Admitting: Family Medicine

## 2014-02-05 ENCOUNTER — Encounter: Payer: Self-pay | Admitting: Family Medicine

## 2014-02-05 VITALS — BP 148/89 | HR 96 | Ht 66.0 in | Wt 163.0 lb

## 2014-02-05 DIAGNOSIS — M25569 Pain in unspecified knee: Secondary | ICD-10-CM

## 2014-02-05 DIAGNOSIS — M25561 Pain in right knee: Secondary | ICD-10-CM

## 2014-02-05 MED ORDER — KETOROLAC TROMETHAMINE 60 MG/2ML IM SOLN
60.0000 mg | Freq: Once | INTRAMUSCULAR | Status: AC
  Administered 2014-02-05: 60 mg via INTRAMUSCULAR

## 2014-02-05 NOTE — Progress Notes (Signed)
Subjective:    Patient ID: Mia [name], female    DOB: 11-16-1950, 63 y.o.   MRN: [id number]  HPI  Here to followup right knee pain. She saw my partner, Dr. Aundria Mems last Wednesday, approximately 6 days ago for a steroid injection. He injected the knee and that has anserine bursa. She says it provided relief for about 10 or 15 minutes of the time she got down to x-ray it was so painful again she was unable to walk on it.  Knee has been giving out. Not locking.  She has started suing crutches over the weeknd.  She can flex somewhat without significant discomfort with this and she sits weight on it it's extremely painful. She is unable to fully extend her knee but this is not new..  Not using anything for pain relief. Re ally can't bear weight on it.  She's had multiple as procedures and surgeries on the knee. There is no hardware in place. 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 Also history of quadriceps tear from the knee. She's also had arthroscopy a couple of times.   Review of Systems  There were no vitals taken for this visit.    Allergies  Allergen Reactions  . Percocet [Oxycodone-Acetaminophen] Nausea And Vomiting  . Codeine   . Hydrocodone Nausea And Vomiting  . Nitrofurantoin   . Oxycodone-Acetaminophen Nausea And Vomiting  . Tramadol Nausea And Vomiting  . Verapamil     REACTION: intolerance  . Darvocet [Propoxyphene N-Acetaminophen] Nausea And Vomiting    Past Medical History  Diagnosis Date  . SVT (supraventricular tachycardia)   . Sleep apnea   . Raynaud phenomenon   . Edema   . Anemia   . Arthritis   . Blood transfusion   . Diabetes mellitus   . GERD (gastroesophageal reflux disease)     gastritis  . Hypertension   . Neuromuscular disorder     sclerosis  . Neuromuscular disorder     fibromyalgia  . Pseudogout   . Thyroid disease     hypothyroidism  . Hemorrhoids   . Rectal bleeding   . PONV (postoperative nausea and vomiting)   . Memory difficulty  12/14/2013  . Fibromyalgia     Past Surgical History  Procedure Laterality Date  . Cholecystectomy    . Appendectomy    . Tonsillectomy    . Hysterectomy - unknown type    . Knee surgery      x6  . Oophorectomy    . Dilation and curettage of uterus    . Breast biopsy    . Shoulder surgery      bilateral- bones spur  . Neck surgery      fusion  . Quadriceps repair      right  . Bladder surgery      x2  . Cataract extraction      right  . Rectocele repair      x2  . Enterocele repair      x2  . Cardiac catheterization    . Eye surgery      retina  . Shoulder arthroscopy with subacromial decompression, rotator cuff repair and bicep tendon repair  10/06/2012    Procedure: SHOULDER ARTHROSCOPY WITH SUBACROMIAL DECOMPRESSION, ROTATOR CUFF REPAIR AND BICEP TENDON REPAIR;  Surgeon: Nita Sells, MD;  Location: [facility];  Service: Orthopedics;  Laterality: Right;  Arthroscopic  Repair  of  Subscapularis, Open Biceps Tenodesis    History  Social History  . Marital Status: Married    Spouse Name: N/A    Number of Children: 2  . Years of Education: N/A   Occupational History  . Retired    Social History Main Topics  . Smoking status: Never Smoker   . Smokeless tobacco: Never Used  . Alcohol Use: No  . Drug Use: No  . Sexual Activity: Not on file   Other Topics Concern  . Not on file   Social History Narrative  . No narrative on file    Family History  Problem Relation Age of Onset  . Heart attack    . Stroke    . Diabetes    . Cancer Maternal Grandmother     breast  . Cancer Maternal Grandfather     pancreatic  . Heart disease Mother   . Heart disease Father   . Pulmonary fibrosis Sister   . Stroke Sister   . Lupus Sister     Outpatient Encounter Prescriptions as of 02/05/2014  Medication Sig  . B-D TB SYRINGE 1CC/27GX1/2" 27G X 1/2" 1 ML MISC Inject 1 each into the muscle once a week.  . baclofen (LIORESAL) 10 MG tablet  Take 10 mg by mouth daily after breakfast.  . calcium carbonate (OS-CAL) 600 MG TABS Take 600 mg by mouth 2 (two) times daily with a meal.    . cetirizine (ZYRTEC) 10 MG tablet Take 10 mg by mouth daily.  . cyclobenzaprine (FLEXERIL) 10 MG tablet Take 10 mg by mouth at bedtime.  . diclofenac sodium (VOLTAREN) 1 % GEL Apply topically as needed.  . diltiazem (CARDIZEM CD) 360 MG 24 hr capsule Take 1 capsule (360 mg total) by mouth daily.  Marland Kitchen donepezil (ARICEPT) 10 MG tablet Take 1 tablet (10 mg total) by mouth at bedtime.  . fluticasone (FLONASE) 50 MCG/ACT nasal spray 2 sprays by Nasal route daily.    . folic acid (FOLVITE) 1 MG tablet Take 1 mg by mouth 2 (two) times daily.   . furosemide (LASIX) 20 MG tablet Take 20 mg by mouth daily.    . metFORMIN (GLUCOPHAGE) 1000 MG tablet Take 500 mg by mouth daily.   . Meth-Hyo-M Bl-Na Phos-Ph Sal (URIBEL PO) Take by mouth as needed.    . methotrexate 1 G injection Inject 0.8 mg into the muscle once a week.   . Multiple Vitamin (MULTIVITAMIN) capsule Take 1 capsule by mouth daily.  . pantoprazole (PROTONIX) 40 MG tablet Take 40 mg by mouth daily.  . propranolol (INDERAL) 10 MG tablet Take 10 mg by mouth 3 (three) times daily as needed.   . sulfaSALAzine (AZULFIDINE) 500 MG EC tablet Take 1,000 mg by mouth 2 (two) times daily.   . SUMAtriptan (IMITREX) 50 MG tablet Take 50 mg by mouth every 2 (two) hours as needed.  . venlafaxine XR (EFFEXOR-XR) 75 MG 24 hr capsule Take 1 capsule (75 mg total) by mouth daily with breakfast.  . vitamin D, CHOLECALCIFEROL, 400 UNITS tablet Take 400 Units by mouth 2 (two) times daily.    . [EXPIRED] ketorolac (TORADOL) injection 60 mg           Objective:   Physical Exam  Musculoskeletal:  Right knee with old surgical scars that are well-healed. No definite swelling. She is tender along the medial tibial tuberosity and over the medial joint line. Normal flexion she is unable to didn't fully. Some slight increased  laxity with anterior drawer test but not different from left  knee. Knee and ankle flexion/extension 5 over 5 bilaterally.          Assessment & Plan:  Right knee pain - at this point since she's been unable to bear weight on the knee from [place] 5 days I would like to move forward with an MRI. There is really no increased warmth or swelling of the knee to indicate inflammation. I really think that there may be some structural damage that may be causing this. She's a little bit tender over the medial tibial tuberosity and the medial joint line. Surgical scars were well healed. I offered to give her medication for pain relief. This point she has many intolerances so we'll stick with the Voltaren gel. She can call me if needed. Also if she needs a work note and please let me know. Recommend for now. She says she or he has one home and will use that. Continue to use crutches and avoid weightbearing.knee immobilizer

## 2014-02-05 NOTE — Telephone Encounter (Signed)
Pt called back and stated that she will need a dr's note from today until the mri is done.  She requested that this be emailed to : [email or web address].  Since she does not have a scheduled time for the MRI I did not complete this.Teddy Spike

## 2014-02-06 NOTE — Telephone Encounter (Signed)
Okay to write out for the rest of this week.

## 2014-02-09 ENCOUNTER — Telehealth: Payer: Self-pay | Admitting: *Deleted

## 2014-02-09 NOTE — Telephone Encounter (Signed)
PA obtained for MRI Knee RT w/o contrast.  Auth # [id number].  Oscar La, LPN

## 2014-02-09 NOTE — Telephone Encounter (Signed)
Spoke with pt and informed her that we are waiting on auth # for MRI. Dr. Madilyn Fireman has called and we are waiting on the fax.  Oscar La, LPN

## 2014-02-10 ENCOUNTER — Ambulatory Visit (HOSPITAL_BASED_OUTPATIENT_CLINIC_OR_DEPARTMENT_OTHER): Payer: Medicare HMO

## 2014-02-10 ENCOUNTER — Ambulatory Visit (HOSPITAL_BASED_OUTPATIENT_CLINIC_OR_DEPARTMENT_OTHER)
Admission: RE | Admit: 2014-02-10 | Discharge: 2014-02-10 | Disposition: A | Discharge status: Home / Self Care | Payer: Medicare HMO | Source: Ambulatory Visit | Attending: Family Medicine | Admitting: Family Medicine

## 2014-02-10 DIAGNOSIS — M25569 Pain in unspecified knee: Secondary | ICD-10-CM | POA: Insufficient documentation

## 2014-02-10 DIAGNOSIS — M25561 Pain in right knee: Secondary | ICD-10-CM

## 2014-02-12 NOTE — Telephone Encounter (Signed)
Letter written and emailed.Teddy Spike

## 2014-02-16 ENCOUNTER — Ambulatory Visit (INDEPENDENT_AMBULATORY_CARE_PROVIDER_SITE_OTHER): Payer: Medicare HMO | Admitting: Sports Medicine

## 2014-02-16 ENCOUNTER — Encounter: Payer: Self-pay | Admitting: Sports Medicine

## 2014-02-16 VITALS — BP 157/84 | HR 95 | Ht 66.0 in | Wt 164.0 lb

## 2014-02-16 DIAGNOSIS — M25569 Pain in unspecified knee: Secondary | ICD-10-CM

## 2014-02-16 DIAGNOSIS — M25561 Pain in right knee: Secondary | ICD-10-CM

## 2014-02-16 MED ORDER — AMBULATORY NON FORMULARY MEDICATION: Status: DC

## 2014-02-16 NOTE — Progress Notes (Signed)
  Subjective:    CC: Follow up MRI results  HPI: This is a very pleasant 63 year old female, I saw her some time ago with knee pain, she did have arthritis on x-rays, and no history of trauma. We injected her knee joint and her pes anserine bursa. Unfortunately she had worsening of pain, she saw Dr. Madilyn Fireman who ordered an MRI, the results of which we dictated below. Pain is localized predominately anteromedially just distal to the joint line on the tibia. She was placed in a knee immobilizer, and I recommended nonweightbearing on crutches. pain is severe, persistent.  Past medical history, Surgical history, Family history not pertinant except as noted below, Social history, Allergies, and medications have been entered into the medical record, reviewed, and no changes needed.   Review of Systems: No fevers, chills, night sweats, weight loss, chest pain, or shortness of breath.   Objective:    General: Well Developed, well nourished, and in no acute distress.  Neuro: Alert and oriented x3, extra-ocular muscles intact, sensation grossly intact.  HEENT: Normocephalic, atraumatic, pupils equal round reactive to light, neck supple, no masses, no lymphadenopathy, thyroid nonpalpable.  Skin: Warm and dry, no rashes. Cardiac: Regular rate and rhythm, no murmurs rubs or gallops, no lower extremity edema.  Respiratory: Clear to auscultation bilaterally. Not using accessory muscles, speaking in full sentences. Right knee: Tender to palpation at the anteromedial tibia just distal to the joint line, only minimal tenderness to palpation at the joint line with a negative McMurray's, she has full range of motion and very little if any swelling.  MRI was personally reviewed, there is tricompartmental DJD worse in the patellofemoral compartment, there's also a degenerative tear of the medial meniscus as well as edema in the tibial plateau medially. No sign of a fracture line.  Impression and Recommendations:

## 2014-02-16 NOTE — Assessment & Plan Note (Addendum)
Insufficient response after injection. Intolerant of narcotics, and since, and tramadol. Pain is predominately at the proximal tibia but not quite at the joint line. MRI did show arthritis, degenerative meniscal tear, and edema at the medial proximal tibia. I do think the bone bruising at the proximal tibia is the likely cause of her pain. Topical diclofenac, nonweightbearing with a knee immobilizer. Knee scooter. Return to see me in a couple of weeks. I do anticipate a total of 4 weeks of nonweightbearing and immobilization.

## 2014-02-27 ENCOUNTER — Encounter

## 2014-02-28 ENCOUNTER — Ambulatory Visit (INDEPENDENT_AMBULATORY_CARE_PROVIDER_SITE_OTHER): Payer: Medicare HMO | Admitting: Sports Medicine

## 2014-02-28 ENCOUNTER — Encounter: Payer: Self-pay | Admitting: Sports Medicine

## 2014-02-28 VITALS — BP 136/74 | HR 79 | Ht 66.0 in | Wt 162.0 lb

## 2014-02-28 DIAGNOSIS — M25561 Pain in right knee: Secondary | ICD-10-CM

## 2014-02-28 DIAGNOSIS — M25569 Pain in unspecified knee: Secondary | ICD-10-CM

## 2014-02-28 MED ORDER — ATORVASTATIN 40 MG TAB
40 mg | ORAL_TABLET | Freq: Every day | ORAL | Status: DC
Start: 2014-02-28 — End: 2015-02-28

## 2014-02-28 NOTE — Assessment & Plan Note (Signed)
MRI did show arthritis, predominantly patellofemoral, a degenerative meniscal tear and medial proximal tibial edema. After 2 weeks of immobilization, or medial proximal tibial pain has resolved. Injection which provided temporary/partial response. Unfortunately she continues to have patellofemoral-type symptoms, recently she did have an episode of knee locking. I do think that this warrants arthroscopy, and after mechanical symptoms have been corrected, we can continue with viscous supplementation. [name] out of network for her, I am going to send her to [name] here in [place]. Like to see her back after arthroscopy, I have invited her to get an MRI CD for Dr. pill to review.

## 2014-02-28 NOTE — Progress Notes (Signed)
  Subjective:    CC: Followup  HPI: Knee pain: At this point she is post physical therapy, injection, NSAIDs. MRI showed generalized compartment osteoarthritis worse in the patellofemoral joint, proximal medial tibial bruising, as well as a medial degenerative meniscal tear. Pain at the medial proximal tibia has resolved with nonweightbearing, she continues to have patellofemoral-type symptoms. She is now telling me she's getting occasional catching and locking. Symptoms are moderate, persistent.  Past medical history, Surgical history, Family history not pertinant except as noted below, Social history, Allergies, and medications have been entered into the medical record, reviewed, and no changes needed.   Review of Systems: No fevers, chills, night sweats, weight loss, chest pain, or shortness of breath.   Objective:    General: Well Developed, well nourished, and in no acute distress.  Neuro: Alert and oriented x3, extra-ocular muscles intact, sensation grossly intact.  HEENT: Normocephalic, atraumatic, pupils equal round reactive to light, neck supple, no masses, no lymphadenopathy, thyroid nonpalpable.  Skin: Warm and dry, no rashes. Cardiac: Regular rate and rhythm, no murmurs rubs or gallops, no lower extremity edema.  Respiratory: Clear to auscultation bilaterally. Not using accessory muscles, speaking in full sentences. Right Knee: Normal to inspection with no erythema or effusion or obvious bony abnormalities. Tender to palpation at the medial joint line. ROM full in flexion and extension and lower leg rotation. Ligaments with solid consistent endpoints including ACL, PCL, LCL, MCL. Negative Mcmurray's, Apley's, and Thessalonian tests. Minimally painful patellar compression with crepitus.  Patellar and quadriceps tendons unremarkable. Hamstring and quadriceps strength is normal.    MRI from 2 weeks ago shows tricompartmental osteoarthritis, medial proximal tibial bruising,  medial meniscal tear.  Impression and Recommendations:

## 2014-03-08 MED ORDER — OMEPRAZOLE 20 MG CAP, DELAYED RELEASE
20 mg | ORAL_CAPSULE | ORAL | Status: DC
Start: 2014-03-08 — End: 2014-05-25

## 2014-03-09 ENCOUNTER — Encounter: Payer: Self-pay | Admitting: Family Medicine

## 2014-03-09 ENCOUNTER — Ambulatory Visit (INDEPENDENT_AMBULATORY_CARE_PROVIDER_SITE_OTHER): Payer: Medicare HMO | Admitting: Family Medicine

## 2014-03-09 VITALS — BP 130/71 | HR 72 | Wt 163.0 lb

## 2014-03-09 DIAGNOSIS — M8580 Other specified disorders of bone density and structure, unspecified site: Secondary | ICD-10-CM | POA: Insufficient documentation

## 2014-03-09 DIAGNOSIS — H35379 Puckering of macula, unspecified eye: Secondary | ICD-10-CM

## 2014-03-09 DIAGNOSIS — M899 Disorder of bone, unspecified: Secondary | ICD-10-CM

## 2014-03-09 DIAGNOSIS — K219 Gastro-esophageal reflux disease without esophagitis: Secondary | ICD-10-CM | POA: Insufficient documentation

## 2014-03-09 DIAGNOSIS — M797 Fibromyalgia: Secondary | ICD-10-CM | POA: Insufficient documentation

## 2014-03-09 DIAGNOSIS — R7301 Impaired fasting glucose: Secondary | ICD-10-CM | POA: Insufficient documentation

## 2014-03-09 DIAGNOSIS — M949 Disorder of cartilage, unspecified: Secondary | ICD-10-CM

## 2014-03-09 DIAGNOSIS — D729 Disorder of white blood cells, unspecified: Secondary | ICD-10-CM

## 2014-03-09 DIAGNOSIS — IMO0001 Reserved for inherently not codable concepts without codable children: Secondary | ICD-10-CM

## 2014-03-09 DIAGNOSIS — N3010 Interstitial cystitis (chronic) without hematuria: Secondary | ICD-10-CM

## 2014-03-09 DIAGNOSIS — H35371 Puckering of macula, right eye: Secondary | ICD-10-CM | POA: Insufficient documentation

## 2014-03-09 DIAGNOSIS — K649 Unspecified hemorrhoids: Secondary | ICD-10-CM | POA: Insufficient documentation

## 2014-03-09 DIAGNOSIS — R635 Abnormal weight gain: Secondary | ICD-10-CM

## 2014-03-09 MED ORDER — FUROSEMIDE 20 MG PO TABS: 20.0000 mg | ORAL_TABLET | Freq: Every day | ORAL | Status: DC

## 2014-03-09 MED ORDER — PANTOPRAZOLE SODIUM 40 MG PO TBEC: 40.0000 mg | DELAYED_RELEASE_TABLET | Freq: Every day | ORAL | Status: DC

## 2014-03-09 NOTE — Progress Notes (Signed)
Subjective:    Patient ID: Mia [name], female    DOB: Sep 15, 1951, 63 y.o.   MRN: [id number]  HPI Here to establish care today. Her previous PCP recently retired. And she is looking to get established. She does have a past medical history significant for interstitial cystitis. She does seen urology for this.  She also has a history of hypertension which is well-controlled. She follows with cardiology for SVT and paroxysmal supraventricular tachycardia. She also has a history of prediabetes and says it has been a while since she's had an A1c checked. She does have a history of retinal wrinkling but not related to her diabetes per se. She also has fibromyalgia, for which she takes Effexor. She used to be on Lyrica years ago but started having lower extremity swelling secondary to the medication. She's also followed by neurology for issues with memory loss. She's currently on Aricept and followed by Dr. [name].   Review of Systems  Constitutional: Negative for fever, diaphoresis and unexpected weight change.  HENT: Positive for hearing loss. Negative for rhinorrhea and tinnitus.   Eyes: Negative for visual disturbance.  Respiratory: Negative for cough and wheezing.   Cardiovascular: Negative for chest pain and palpitations.  Gastrointestinal: Negative for nausea, vomiting, diarrhea and blood in stool.  Genitourinary: Negative for vaginal bleeding, vaginal discharge and difficulty urinating.  Musculoskeletal: Positive for arthralgias and myalgias.  Skin: Negative for rash.  Neurological: Negative for headaches.  Hematological: Negative for adenopathy. Does not bruise/bleed easily.  Psychiatric/Behavioral: Negative for sleep disturbance and dysphoric mood. The patient is not nervous/anxious.     BP 130/71  Pulse 72  Wt 163 lb (73.936 kg)    Allergies  Allergen Reactions  . Percocet [Oxycodone-Acetaminophen] Nausea And Vomiting  . Codeine Nausea And Vomiting  . Hydrocodone Nausea  And Vomiting  . Nitrofurantoin Nausea And Vomiting  . Oxycodone-Acetaminophen Nausea And Vomiting  . Tramadol Nausea And Vomiting  . Verapamil Nausea And Vomiting    REACTION: intolerance  . Darvocet [Propoxyphene N-Acetaminophen] Nausea And Vomiting    Past Medical History  Diagnosis Date  . SVT (supraventricular tachycardia)   . Sleep apnea   . Raynaud phenomenon   . Edema   . Anemia   . Arthritis   . Blood transfusion   . Diabetes mellitus   . GERD (gastroesophageal reflux disease)     gastritis  . Hypertension   . Neuromuscular disorder     sclerosis  . Neuromuscular disorder     fibromyalgia  . Pseudogout   . Thyroid disease     hypothyroidism  . Hemorrhoids   . Rectal bleeding   . PONV (postoperative nausea and vomiting)   . Memory difficulty 12/14/2013  . Fibromyalgia     Past Surgical History  Procedure Laterality Date  . Cholecystectomy    . Appendectomy    . Tonsillectomy    . Hysterectomy - unknown type    . Knee surgery      x6  . Oophorectomy    . Dilation and curettage of uterus    . Breast biopsy    . Shoulder surgery      bilateral- bones spur  . Neck surgery      fusion  . Quadriceps repair      right  . Bladder surgery      x2  . Cataract extraction      bilatera  . Rectocele repair      x2  . Enterocele repair  x2  . Cardiac catheterization    . Eye surgery      retina  . Shoulder arthroscopy with subacromial decompression, rotator cuff repair and bicep tendon repair  10/06/2012    Procedure: SHOULDER ARTHROSCOPY WITH SUBACROMIAL DECOMPRESSION, ROTATOR CUFF REPAIR AND BICEP TENDON REPAIR;  Surgeon: Nita Sells, MD;  Location: [facility];  Service: Orthopedics;  Laterality: Right;  Arthroscopic  Repair  of  Subscapularis, Open Biceps Tenodesis  . Bladder surgery      History   Social History  . Marital Status: Married    Spouse Name: N/A    Number of Children: 2  . Years of Education: N/A    Occupational History  . Retired    Social History Main Topics  . Smoking status: Never Smoker   . Smokeless tobacco: Never Used  . Alcohol Use: No  . Drug Use: No  . Sexual Activity: Not on file   Other Topics Concern  . Not on file   Social History Narrative  . No narrative on file    Family History  Problem Relation Age of Onset  . Heart attack    . Stroke    . Diabetes    . Cancer Maternal Grandmother     breast  . Cancer Maternal Grandfather     pancreatic  . Heart disease Mother   . Heart disease Father   . Pulmonary fibrosis Sister   . Stroke Sister   . Lupus Sister     Outpatient Encounter Prescriptions as of 03/09/2014  Medication Sig  . AMBULATORY NON FORMULARY MEDICATION Knee scooter for use 4 weeks.  . B-D TB SYRINGE 1CC/27GX1/2" 27G X 1/2" 1 ML MISC Inject 1 each into the muscle once a week.  . baclofen (LIORESAL) 10 MG tablet Take 10 mg by mouth daily after breakfast.  . calcium carbonate (OS-CAL) 600 MG TABS Take 600 mg by mouth 2 (two) times daily with a meal.    . cetirizine (ZYRTEC) 10 MG tablet Take 10 mg by mouth daily.  . cyclobenzaprine (FLEXERIL) 10 MG tablet Take 10 mg by mouth at bedtime.  . diclofenac sodium (VOLTAREN) 1 % GEL Apply topically as needed.  . diltiazem (CARDIZEM CD) 360 MG 24 hr capsule Take 1 capsule (360 mg total) by mouth daily.  Marland Kitchen donepezil (ARICEPT) 10 MG tablet Take 1 tablet (10 mg total) by mouth at bedtime.  . fluticasone (FLONASE) 50 MCG/ACT nasal spray 2 sprays by Nasal route daily.    . folic acid (FOLVITE) 1 MG tablet Take 1 mg by mouth 2 (two) times daily.   . furosemide (LASIX) 20 MG tablet Take 1 tablet (20 mg total) by mouth daily.  . metFORMIN (GLUCOPHAGE) 1000 MG tablet Take 500 mg by mouth daily.   . Meth-Hyo-M Bl-Na Phos-Ph Sal (URIBEL PO) Take by mouth as needed.    . methotrexate 1 G injection Inject 0.8 mg into the muscle once a week.   . Multiple Vitamin (MULTIVITAMIN) capsule Take 1 capsule by mouth  daily.  . pantoprazole (PROTONIX) 40 MG tablet Take 1 tablet (40 mg total) by mouth daily.  . propranolol (INDERAL) 10 MG tablet Take 10 mg by mouth 3 (three) times daily as needed.   . sulfaSALAzine (AZULFIDINE) 500 MG EC tablet Take 1,000 mg by mouth 2 (two) times daily.   . SUMAtriptan (IMITREX) 50 MG tablet Take 50 mg by mouth every 2 (two) hours as needed.  . venlafaxine XR (EFFEXOR-XR) 75 MG  24 hr capsule Take 1 capsule (75 mg total) by mouth daily with breakfast.  . vitamin D, CHOLECALCIFEROL, 400 UNITS tablet Take 400 Units by mouth 2 (two) times daily.    . [DISCONTINUED] furosemide (LASIX) 20 MG tablet Take 20 mg by mouth daily.    . [DISCONTINUED] pantoprazole (PROTONIX) 40 MG tablet Take 40 mg by mouth daily.          Objective:   Physical Exam  Constitutional: She is oriented to person, place, and time. She appears well-developed and well-nourished.  HENT:  Head: Normocephalic and atraumatic.  Cardiovascular: Normal rate, regular rhythm and normal heart sounds.   Pulmonary/Chest: Effort normal and breath sounds normal.  Musculoskeletal: She exhibits no edema.  Neurological: She is alert and oriented to person, place, and time.  Skin: Skin is warm and dry.  Psychiatric: She has a normal mood and affect. Her behavior is normal.          Assessment & Plan:  IFG-will do labs check A1c to make sure that she still well controlled. She was to see what her numbers before restarting metformin. She's been on this to help prevent the progression of diabetes for a while. She tolerates it well without any side effects or problems.  Myalgia-under good control with her Effexor.  Hypertension-well-controlled on current regimen. Followup in 6 months. Check CMP.  Interstitial cystitis-she needs to get back in with her urologist soon. She's been having a flare in her symptoms. I asked her to let us know she needs any help with this.  GERD-patient needs refill on her pantoprazole.  She does have a history of gastric ulcers her symptoms have been well controlled recently. No results found for this basename: HGBA1C

## 2014-03-10 LAB — COMPLETE METABOLIC PANEL WITH GFR
ALT: 29 U/L (ref 0–35)
AST: 23 U/L (ref 0–37)
Albumin: 4.5 g/dL (ref 3.5–5.2)
Alkaline Phosphatase: 84 U/L (ref 39–117)
BUN: 14 mg/dL (ref 6–23)
CO2: 29 mEq/L (ref 19–32)
Calcium: 9.8 mg/dL (ref 8.4–10.5)
Chloride: 100 mEq/L (ref 96–112)
Creat: 0.84 mg/dL (ref 0.50–1.10)
GFR, Est African American: 86 mL/min
GFR, Est Non African American: 75 mL/min
Glucose, Bld: 88 mg/dL (ref 70–99)
Potassium: 4.2 mEq/L (ref 3.5–5.3)
Sodium: 140 mEq/L (ref 135–145)
Total Bilirubin: 0.6 mg/dL (ref 0.2–1.2)
Total Protein: 7.1 g/dL (ref 6.0–8.3)

## 2014-03-10 LAB — CBC WITH DIFFERENTIAL/PLATELET
Basophils Absolute: 0.1 10*3/uL (ref 0.0–0.1)
Basophils Relative: 1 % (ref 0–1)
Eosinophils Absolute: 0.1 10*3/uL (ref 0.0–0.7)
Eosinophils Relative: 1 % (ref 0–5)
HCT: 39.3 % (ref 36.0–46.0)
Hemoglobin: 13.1 g/dL (ref 12.0–15.0)
Lymphocytes Relative: 24 % (ref 12–46)
Lymphs Abs: 2 10*3/uL (ref 0.7–4.0)
MCH: 33.2 pg (ref 26.0–34.0)
MCHC: 33.3 g/dL (ref 30.0–36.0)
MCV: 99.7 fL (ref 78.0–100.0)
Monocytes Absolute: 0.5 10*3/uL (ref 0.1–1.0)
Monocytes Relative: 6 % (ref 3–12)
Neutro Abs: 5.6 10*3/uL (ref 1.7–7.7)
Neutrophils Relative %: 68 % (ref 43–77)
Platelets: 423 10*3/uL — ABNORMAL HIGH (ref 150–400)
RBC: 3.94 MIL/uL (ref 3.87–5.11)
RDW: 16.6 % — ABNORMAL HIGH (ref 11.5–15.5)
WBC: 8.3 10*3/uL (ref 4.0–10.5)

## 2014-03-10 LAB — TSH: TSH: 0.945 u[IU]/mL (ref 0.350–4.500)

## 2014-03-10 LAB — HEMOGLOBIN A1C
Hgb A1c MFr Bld: 6 % — ABNORMAL HIGH (ref <5.7)
Mean Plasma Glucose: 126 mg/dL — ABNORMAL HIGH (ref <117)

## 2014-03-13 ENCOUNTER — Other Ambulatory Visit: Payer: Self-pay | Admitting: Family Medicine

## 2014-03-13 MED ORDER — METFORMIN HCL 500 MG PO TABS: 500.0000 mg | ORAL_TABLET | Freq: Two times a day (BID) | ORAL | Status: DC

## 2014-03-21 ENCOUNTER — Telehealth: Payer: Self-pay

## 2014-03-21 NOTE — Telephone Encounter (Signed)
Received surgical clearance form from [facility].Dr.Nahser cleared patient for surgery.Form faxed back to fax # [phone].

## 2014-03-25 ENCOUNTER — Encounter: Payer: Self-pay | Admitting: Emergency Medicine

## 2014-03-25 ENCOUNTER — Emergency Department
Admission: EM | Admit: 2014-03-25 | Discharge: 2014-03-25 | Disposition: A | Discharge status: Home / Self Care | Payer: Medicare HMO | Source: Home / Self Care | Attending: Emergency Medicine | Admitting: Emergency Medicine

## 2014-03-25 DIAGNOSIS — J069 Acute upper respiratory infection, unspecified: Secondary | ICD-10-CM

## 2014-03-25 LAB — CBC
HCT: 41.8 % (ref 36.0–46.0)
Hemoglobin: 14.1 g/dL (ref 12.0–15.0)
MCH: 33 pg (ref 26.0–34.0)
MCHC: 33.7 g/dL (ref 30.0–36.0)
MCV: 97.9 fL (ref 78.0–100.0)
Platelets: 446 10*3/uL — ABNORMAL HIGH (ref 150–400)
RBC: 4.27 MIL/uL (ref 3.87–5.11)
RDW: 16 % — ABNORMAL HIGH (ref 11.5–15.5)
WBC: 8.1 10*3/uL (ref 4.0–10.5)

## 2014-03-25 LAB — POCT RAPID STREP A (OFFICE): Rapid Strep A Screen: NEGATIVE

## 2014-03-25 LAB — POCT INFLUENZA A/B
Influenza A, POC: NEGATIVE
Influenza B, POC: NEGATIVE

## 2014-03-25 MED ORDER — DOXYCYCLINE HYCLATE 100 MG PO CAPS: 100.0000 mg | ORAL_CAPSULE | Freq: Two times a day (BID) | ORAL | Status: DC

## 2014-03-25 NOTE — ED Provider Notes (Signed)
Abdomen CSN: [id number]     Arrival date & time 03/25/14  1254 History   First MD Initiated Contact with Patient 03/25/14 1258     Chief Complaint  Patient presents with  . Nausea  . Headache   (Consider location/radiation/quality/duration/timing/severity/associated sxs/prior Treatment) HPI Mia [name] is a 63 y.o. female who complains of onset of cold symptoms for 4 days.  The symptoms are constant and moderate in severity.  She is mostly complaining of an intermittent severe headache, he had 10 with some intermittent nausea which is improved with Zofran.  One episode of vomiting, some diarrhea, sweating, fatigue, and some upper respiratory symptoms as well.  No known sick contacts.  She does have prediabetes and has been checking her blood sugar which averages about 100 7 in the morning fasting.  She also has rheumatoid arthritis and takes methotrexate weekly.  She has taken Imitrex for the headaches which is also helped.  No chest pain, prevascular symptoms, numbness, tingling, weakness, blurry vision, speech difficulties.    Past Medical History  Diagnosis Date  . SVT (supraventricular tachycardia)   . Sleep apnea   . Raynaud phenomenon   . Edema   . Anemia   . Arthritis   . Blood transfusion   . Diabetes mellitus   . GERD (gastroesophageal reflux disease)     gastritis  . Hypertension   . Neuromuscular disorder     sclerosis  . Neuromuscular disorder     fibromyalgia  . Pseudogout   . Thyroid disease     hypothyroidism  . Hemorrhoids   . Rectal bleeding   . PONV (postoperative nausea and vomiting)   . Memory difficulty 12/14/2013  . Fibromyalgia    Past Surgical History  Procedure Laterality Date  . Cholecystectomy    . Appendectomy    . Tonsillectomy    . Hysterectomy - unknown type    . Knee surgery      x6  . Oophorectomy    . Dilation and curettage of uterus    . Breast biopsy    . Shoulder surgery      bilateral- bones spur  . Neck surgery      fusion  .  Quadriceps repair      right  . Bladder surgery      x2  . Cataract extraction      bilatera  . Rectocele repair      x2  . Enterocele repair      x2  . Cardiac catheterization    . Eye surgery      retina  . Shoulder arthroscopy with subacromial decompression, rotator cuff repair and bicep tendon repair  10/06/2012    Procedure: SHOULDER ARTHROSCOPY WITH SUBACROMIAL DECOMPRESSION, ROTATOR CUFF REPAIR AND BICEP TENDON REPAIR;  Surgeon: Nita Sells, MD;  Location: [facility];  Service: Orthopedics;  Laterality: Right;  Arthroscopic  Repair  of  Subscapularis, Open Biceps Tenodesis  . Bladder surgery     Family History  Problem Relation Age of Onset  . Heart attack    . Stroke    . Diabetes    . Cancer Maternal Grandmother     breast  . Cancer Maternal Grandfather     pancreatic  . Heart disease Mother   . Heart disease Father   . Pulmonary fibrosis Sister   . Stroke Sister   . Lupus Sister    History  Substance Use Topics  . Smoking status: Never Smoker   . Smokeless  tobacco: Never Used  . Alcohol Use: No   OB History   Grav Para Term Preterm Abortions TAB SAB Ect Mult Living                 Review of Systems  All other systems reviewed and are negative.   Allergies  Percocet; Codeine; Hydrocodone; Nitrofurantoin; Oxycodone-acetaminophen; Tramadol; Verapamil; and Darvocet  Home Medications   Prior to Admission medications   Medication Sig Start Date End Date Taking? Authorizing Provider  AMBULATORY NON FORMULARY MEDICATION Knee scooter for use 4 weeks. 02/16/14   Silverio Decamp, MD  B-D TB SYRINGE 1CC/27GX1/2" 27G X 1/2" 1 ML MISC Inject 1 each into the muscle once a week. 11/13/13   Historical Provider, MD  baclofen (LIORESAL) 10 MG tablet Take 10 mg by mouth daily after breakfast.    Historical Provider, MD  calcium carbonate (OS-CAL) 600 MG TABS Take 600 mg by mouth 2 (two) times daily with a meal.      Historical Provider, MD   cetirizine (ZYRTEC) 10 MG tablet Take 10 mg by mouth daily.    Historical Provider, MD  cyclobenzaprine (FLEXERIL) 10 MG tablet Take 10 mg by mouth at bedtime.    Historical Provider, MD  diclofenac sodium (VOLTAREN) 1 % GEL Apply topically as needed.    Historical Provider, MD  diltiazem (CARDIZEM CD) 360 MG 24 hr capsule Take 1 capsule (360 mg total) by mouth daily. 01/15/14   Thayer Headings, MD  donepezil (ARICEPT) 10 MG tablet Take 1 tablet (10 mg total) by mouth at bedtime. 01/29/14   Kathrynn Ducking, MD  doxycycline (VIBRAMYCIN) 100 MG capsule Take 1 capsule (100 mg total) by mouth 2 (two) times daily. 03/25/14   Janeann Forehand, MD  fluticasone (FLONASE) 50 MCG/ACT nasal spray 2 sprays by Nasal route daily.      Historical Provider, MD  folic acid (FOLVITE) 1 MG tablet Take 1 mg by mouth 2 (two) times daily.  07/29/12   Historical Provider, MD  furosemide (LASIX) 20 MG tablet Take 1 tablet (20 mg total) by mouth daily. 03/09/14   Hali Marry, MD  metFORMIN (GLUCOPHAGE) 500 MG tablet Take 1 tablet (500 mg total) by mouth 2 (two) times daily with a meal. 03/13/14   Hali Marry, MD  Meth-Hyo-M Bl-Na Phos-Ph Sal (URIBEL PO) Take by mouth as needed.      Historical Provider, MD  methotrexate 1 G injection Inject 0.8 mg into the muscle once a week.  08/10/12   Historical Provider, MD  Multiple Vitamin (MULTIVITAMIN) capsule Take 1 capsule by mouth daily.    Historical Provider, MD  pantoprazole (PROTONIX) 40 MG tablet Take 1 tablet (40 mg total) by mouth daily. 03/09/14   Hali Marry, MD  propranolol (INDERAL) 10 MG tablet Take 10 mg by mouth 3 (three) times daily as needed.     Historical Provider, MD  sulfaSALAzine (AZULFIDINE) 500 MG EC tablet Take 1,000 mg by mouth 2 (two) times daily.  11/30/13   Historical Provider, MD  SUMAtriptan (IMITREX) 50 MG tablet Take 50 mg by mouth every 2 (two) hours as needed.    Historical Provider, MD  venlafaxine XR (EFFEXOR-XR) 75 MG 24  hr capsule Take 1 capsule (75 mg total) by mouth daily with breakfast. 12/14/13   Kathrynn Ducking, MD  vitamin D, CHOLECALCIFEROL, 400 UNITS tablet Take 400 Units by mouth 2 (two) times daily.      Historical Provider, MD  BP 133/81  Pulse 95  Temp(Src) 98.3 F (36.8 C) (Oral)  Ht 5\' 6"  (1.676 m)  Wt 162 lb 8 oz (73.71 kg)  BMI 26.24 kg/m2  SpO2 99% Physical Exam  Nursing note and vitals reviewed. Constitutional: She is oriented to person, place, and time. Vital signs are normal. She appears well-developed and well-nourished.  Non-toxic appearance. No distress.  HENT:  Head: Normocephalic and atraumatic.  Right Ear: Tympanic membrane, external ear and ear canal normal.  Left Ear: Tympanic membrane, external ear and ear canal normal.  Nose: Nose normal.  Mouth/Throat: No oropharyngeal exudate, posterior oropharyngeal edema or posterior oropharyngeal erythema.  Eyes: No scleral icterus.  Neck: Neck supple. No spinous process tenderness present. No rigidity.  Cardiovascular: Normal rate, regular rhythm and normal heart sounds.   Pulmonary/Chest: Effort normal and breath sounds normal. No respiratory distress. She has no decreased breath sounds. She has no wheezes. She has no rhonchi.  Neurological: She is alert and oriented to person, place, and time. She has normal strength. No cranial nerve deficit or sensory deficit. GCS eye subscore is 4. GCS verbal subscore is 5. GCS motor subscore is 6.  Skin: Skin is warm and dry.     Raised erythematous area approximately 1 cm in diameter on the posterior left shoulder upper arm, nontender, mild induration, no fluctuance, no drainage.  Consistent with insect bite with slight localized cellulitic infection.  Psychiatric: She has a normal mood and affect. Her speech is normal.    ED Course  Procedures (including critical care time) Labs Review Labs Reviewed  CBC  B. BURGDORFI ANTIBODIES  ROCKY MTN SPOTTED FVR AB, IGG-BLOOD  ROCKY MTN  SPOTTED FVR AB, IGM-BLOOD  POCT RAPID STREP A (OFFICE)  POCT INFLUENZA A/B    Imaging Review No results found.   MDM   1. Acute upper respiratory infections of unspecified site    Rapid flu and rapid strep test are both negative.  No culture was sent.  Other blood work taken percent out were CBC, [place] spotted fever, and Lyme's antibodies.  Due to the fact of her symptoms as well as the bite on her back as described above, I am going to start her on doxycycline to cover staph / Lyme, etc.  She has not taken her methotrexate for about a week and a half so there should be no reaction with that.  She to hold off for now on taking the methotrexate until the labs come back, hopefully negative for Lyme's disease.  If it is positive then we will have to talk to her rheumatologist about it she can take them together or not.  Or she needs to get her methotrexate levels checked.  Most likely diagnosis is viral syndrome NOS and she is going to take some Zofran that she already has at home and some Excedrin Migraine for the headaches.  Worsening pain she is to call us or her primary care doctor back for further advice.  We will call her in a few days with all lab results.  Janeann Forehand, MD 03/25/14 [phone]

## 2014-03-25 NOTE — ED Notes (Signed)
Pt complains of "severe" headache 8/10, nausea, vomiting, diarrhea, sweats, fatigue and SOB for 4 days.

## 2014-03-26 LAB — B. BURGDORFI ANTIBODIES: B burgdorferi Ab IgG+IgM: 0.36 {ISR}

## 2014-03-26 LAB — ROCKY MTN SPOTTED FVR AB, IGM-BLOOD: ROCKY MTN SPOTTED FEVER, IGM: 0.8 IV

## 2014-03-26 LAB — ROCKY MTN SPOTTED FVR AB, IGG-BLOOD: RMSF IgG: 0.06 IV

## 2014-03-27 ENCOUNTER — Telehealth: Payer: Self-pay

## 2014-03-27 ENCOUNTER — Telehealth: Payer: Self-pay | Admitting: Emergency Medicine

## 2014-03-27 MED ORDER — AMBULATORY NON FORMULARY MEDICATION: Status: DC

## 2014-03-27 NOTE — Telephone Encounter (Signed)
Sent test strips to pharmacy

## 2014-03-28 ENCOUNTER — Ambulatory Visit: Payer: Medicare HMO | Admitting: Sports Medicine

## 2014-04-10 ENCOUNTER — Encounter: Payer: Self-pay | Admitting: Family Medicine

## 2014-05-15 LAB — CBC W/O DIFF
HCT: 38 % (ref 34.0–46.6)
HGB: 12.2 g/dL (ref 11.1–15.9)
MCH: 30.6 pg (ref 26.6–33.0)
MCHC: 32.1 g/dL (ref 31.5–35.7)
MCV: 95 fL (ref 79–97)
PLATELET: 193 10*3/uL (ref 150–379)
RBC: 3.99 x10E6/uL (ref 3.77–5.28)
RDW: 13.2 % (ref 12.3–15.4)
WBC: 6.3 10*3/uL (ref 3.4–10.8)

## 2014-05-15 LAB — LIPID PANEL
Cholesterol, total: 133 mg/dL (ref 100–199)
HDL Cholesterol: 39 mg/dL — ABNORMAL LOW (ref 40–78)
LDL, calculated: 69 mg/dL (calc) (ref 0–160)
Triglyceride: 124 mg/dL (ref 0–149)
VLDL, calculated: 25 mg/dL (calc)

## 2014-05-15 LAB — CVD REPORT

## 2014-05-15 LAB — HEMOGLOBIN A1C WITH EAG: Hemoglobin A1c: 6.3 % — ABNORMAL HIGH (ref 4.8–5.6)

## 2014-05-25 MED ORDER — LANSOPRAZOLE 30 MG CAP, DELAYED RELEASE
30 mg | ORAL_CAPSULE | Freq: Every day | ORAL | Status: DC
Start: 2014-05-25 — End: 2015-09-01

## 2014-05-25 NOTE — Progress Notes (Signed)
1. Have you been to the ER, urgent care clinic or hospitalized since your last visit? NO.     2. Have you seen or consulted any other health care providers outside of the [facility] since your last visit (Include any pap smears or colon screening)? NO      Do you have an Advanced Directive? YES    Would you like information on Advanced Directives? NO

## 2014-05-25 NOTE — Progress Notes (Signed)
63 year old white female who presents for f/u    The FSS is better after PT, cortisone shots through Dr Leonard Schwartz been having some mild LUQ discomfort, has been taking the prilosec just about daily 3-4 months or so.  Describes a gnawing pain, no nausea, vomiting, change in bowel habits, bleeding, constitutional complaints. She's not really tried any antacids for this.    She's concerned she may have sleep apnea. She snores all the time according to her husband, also feels tired when she wakes up even after 8 hours of sleep. No witnessed apnea episodes though.    FBS <110, not checking pps.  Denies polyuria, polydipsia, nocturia, vision change.  Weight is not going down and she is really upset about that. She does not exercise much mainly due to motivational issues and being too busy at work, also does not count calories per se.    Vitals 11/15/2013 09/22/2013 03/27/2013 09/19/2012 03/22/2012   Weight 168 166 163 164 156.5   Height 5\' 4"  5\' 4"  5\' 4"  5\' 4"  5\' 4"    BMI 28.82 28.48 27.97 28.14 26.85     She has mild venous stasis changes in the right shin mostly, no vein tenderness    Past Medical History   Diagnosis Date   ??? 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 Multiple lung nodules      no change 10/06, 03/07, 03/08   ??? Anxiety state, unspecified    ??? Palpitations 2008     neg thallium 2008, nl holter 2008, echo nl lv/ef 65%/tr mr/dd/nl pasp   ??? Prediabetes    ??? Vitamin D deficiency    ??? Dyslipidemia    ??? Compression fx, thoracic spine (HCC) 2007     negative DEXA Dr. Marisa Hua   ??? Arthritis      Dr. Synetta Shadow, Dr. Marisa Hua   ??? Frozen shoulder      right Dr. Wynonia Hazard MRI   ??? Overweight (BMI 25.0-29.9)      Past Surgical History   Procedure Laterality Date   ??? Hx hemorrhoidectomy       Dr. Rosendo Gros 2007   ??? Endoscopy, colon, diagnostic  2007     negative Dr Rosendo Gros   ??? Echo 2d adult  12/04     normal with EF 70%   ??? Stress test thallium study  2005     negative   ??? Vas carotid duplex bilateral  2004      negative   ??? [place] abd comp  9/05     negative   ??? Hx gyn       s/p BTL      History     Social History   ??? Marital Status: MARRIED     Spouse Name: N/A     Number of Children: 2   ??? 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 Smoking status: Never Smoker    ??? Smokeless tobacco: Not on file   ??? Alcohol Use: No   ??? Drug Use: No   ??? Sexual Activity: Not on file     Other Topics Concern   ??? Not on file     Social History Narrative     Current Outpatient Prescriptions   Medication Sig   ??? lansoprazole (PREVACID) 30 mg capsule Take 1 Cap by mouth Daily (before  breakfast).   ??? atorvastatin (LIPITOR) 40 mg tablet Take 1 Tab by mouth daily.   ??? citalopram (CELEXA) 10 mg tablet take 1 tablet by mouth once daily   ??? VITAMIN D2 50,000 unit capsule take 1 capsule by mouth every week     No current facility-administered medications for this visit.     Allergies   Allergen Reactions   ??? Fish Oil Nausea Only   ??? Relafen [Nabumetone] Nausea Only     REVIEW OF SYSTEMS: last gyn 2012 Dr. Gunnar Bulla in [place], [place] 6/14, [place] 2007 Dr Rosendo Gros, [place] 2007? Dr Margaretann Loveless ??? no vision change or eye pain  Oral ??? no mouth pain, tongue or tooth problems  Ears ??? no hearing loss, ear pain, fullness, no swallowing problems  Cardiac ??? no CP, PND, orthopnea, edema, palpitations or syncope  Chest ??? no breast masses  Resp ??? no wheezing, chronic coughing, dyspnea  Urinary ??? no dysuria, hematuria, flank pain, urgency, frequency  Ortho ??? no swelling, dec ROM, myalgias  Psych ??? denies any anxiety or depression symptoms, no hallucinations or violent ideation  Endo - no polyuria, polydipsia, nocturia, hot flashes    Visit Vitals   Item Reading   ??? BP 120/76 mmHg   ??? Pulse 101   ??? Temp(Src) 98.3 ??F (36.8 ??C) (Oral)   ??? Ht 5\' 4"  (1.626 m)   ??? Wt 167 lb (75.751 kg)   ??? BMI 28.65 kg/m2   ??? SpO2 99%   neck size 14.5 in    Affect is appropriate.  Mood stable  No apparent distress   HEENT --Anicteric sclerae, tympanic membranes normal,  ear canals normal.  Sinuses were nontender, turbinates normal, hearing normal.  Oropharynx without  erythema, normal tongue, oral mucosa and tonsils.  No thyromegaly, JVD, or bruits.    Lungs --Clear to auscultation and percussion, normal percussion.  Heart --Regular rate and rhythm, no murmurs, rubs, gallops, or clicks.  Abdomen -- Soft and nontender, no hepatosplenomegaly or masses.  Extremities -- Without cyanosis, clubbing, edema. 2+ pulses equally and bilaterally.    LABS  From 5/10 showed   gluc 110, cr 0.70,               alt 23,                                    chol 154, tg 143, hdl 45, ldl-c 80,                                                            tsh 1.91  From 5/10 showed                      2 hr GTT 89  From 8/10 showed                                                               vit d 23.0, ck 57, aldo 5.4  From 8/11 showed   gluc  108,                                     hba1c 6.3,                   chol 160, tg 172, hdl 39, ldl-c 87,  wbc 5.,7 hb 12.3, plt 235, ua neg,     tsh 2.28  From 5/12 showed   gluc 113, cr 0.71, gfr 94,  alt 16, hba1c 6.2, ldl-p 1989, chol 177, tg 161, hdl 43, ldl-c 102  From 11/12 showed                                                     hba1c 5.9, ldl-p 1218, chol 133, tg 116, hdl 45, ldl-c 65  From 5/13 showed   gluc 105, cr 0.55, gfr 102, alt 8,  hba1c 6.2,                   chol 148, tg 107, hdl 45, ldl-c 82,  wbc 5.0, hb 12.5, plt 172, vit d 40.3  From 11/13 showed        hba1c 6.2, ldl-p 1888, chol 176, tg 155, hdl 49, ldl-c 86,  wbc 6.2, hb 12.3, plt 182, vit d 34.4  From 5/14 showed        hba1c 6.2,     chol 152, tg 157, hdl 39, ldl-c 82  From 1/15 showed   gluc 113, cr 0.68, gfr>60, alt 11, hba1c 6.2,     chol 146, tg 130, hdl 43, ldl-c 77    Results for orders placed or performed in visit on 05/14/14   CBC W/O DIFF   Result Value Ref Range    WBC 6.3 3.4 - 10.8 x10E3/uL     RBC 3.99 3.77 - 5.28 x10E6/uL    HGB 12.2 11.1 - 15.9 g/dL    HCT 38.0 34.0 - 46.6 %    MCV 95 79 - 97 fL    MCH 30.6 26.6 - 33.0 pg    MCHC 32.1 31.5 - 35.7 g/dL    RDW 13.2 12.3 - 15.4 %    PLATELET 193 150 - 379 x10E3/uL   HEMOGLOBIN A1C   Result Value Ref Range    Hemoglobin A1c 6.3 (H) 4.8 - 5.6 %   LIPID PANEL   Result Value Ref Range    Cholesterol, total 133 100 - 199 mg/dL    Triglyceride 124 0 - 149 mg/dL    HDL Cholesterol 39 (L) 40 - 78 mg/dL    VLDL, calculated 25 mg/dL (calc)    LDL, calculated 69 0 - 160 mg/dL (calc)   CVD REPORT   Result Value Ref Range    INTERPRETATION Note      Patient Active Problem List   Diagnosis Code   ??? Impaired fasting glucose 790.21   ??? Vitamin D deficiency 268.9   ??? Anxiety 300.00   ??? Dyslipidemia 272.4   ??? Arthritis, degenerative 715.90   ??? Overweight (BMI 25.0-29.9) 278.02     Assessment and plan:  1. Prediabetes. Lifestyle and dietary modification reiterated. Weight loss would be ideal  2. Hyperlipidemia. Continue current  3. Hypovitaminosis D. Continue supp  4. Anxiety.  Continue  celexa  5. Overweight.  Not interested in appetite suppressants at last visit and again today. We went over calculations for weight maintenance and she will dec intake by 300-500 below daily requirements, inc exercise also to 150 min/week if possible   6. FSS.  F/U PT and Dr. Morley Kos  7. R/O sleep apnea.  sched Dr Manuella Ghazi  8. abd sx.  Change to prevacid.  GI consult if no better in 1 month      RTC 1/16    Above conditions discussed at length and patient vocalized understanding.  All questions answered to patient satifaction

## 2014-06-11 ENCOUNTER — Encounter: Payer: Self-pay | Admitting: Family Medicine

## 2014-06-11 ENCOUNTER — Ambulatory Visit (INDEPENDENT_AMBULATORY_CARE_PROVIDER_SITE_OTHER): Payer: Medicare HMO | Admitting: Family Medicine

## 2014-06-11 VITALS — BP 122/72 | HR 71 | Wt 163.0 lb

## 2014-06-11 DIAGNOSIS — R0602 Shortness of breath: Secondary | ICD-10-CM | POA: Insufficient documentation

## 2014-06-11 DIAGNOSIS — G250 Essential tremor: Secondary | ICD-10-CM

## 2014-06-11 DIAGNOSIS — L02519 Cutaneous abscess of unspecified hand: Secondary | ICD-10-CM

## 2014-06-11 DIAGNOSIS — M5136 Other intervertebral disc degeneration, lumbar region: Secondary | ICD-10-CM | POA: Insufficient documentation

## 2014-06-11 DIAGNOSIS — L03019 Cellulitis of unspecified finger: Secondary | ICD-10-CM

## 2014-06-11 DIAGNOSIS — Z23 Encounter for immunization: Secondary | ICD-10-CM

## 2014-06-11 DIAGNOSIS — M545 Low back pain, unspecified: Secondary | ICD-10-CM

## 2014-06-11 DIAGNOSIS — G252 Other specified forms of tremor: Secondary | ICD-10-CM

## 2014-06-11 DIAGNOSIS — R7301 Impaired fasting glucose: Secondary | ICD-10-CM

## 2014-06-11 LAB — POCT GLYCOSYLATED HEMOGLOBIN (HGB A1C): Hemoglobin A1C: 5.8

## 2014-06-11 MED ORDER — CEPHALEXIN 500 MG PO CAPS: 500.0000 mg | ORAL_CAPSULE | Freq: Three times a day (TID) | ORAL | Status: DC

## 2014-06-11 NOTE — Progress Notes (Signed)
Walk test performed and O2% was 93% while exercising without oxygen.Mia [name]

## 2014-06-11 NOTE — Assessment & Plan Note (Signed)
Stable. Well-controlled. Followup in 6 months. Continue metformin. Lab Results  Component Value Date   HGBA1C 5.8 06/11/2014

## 2014-06-11 NOTE — Assessment & Plan Note (Signed)
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 Her that this is a benign condition. Handout provided about tremor. She says her mother had a similar head tremor. We discussed that there are medications that can help dampen the tremor but does not completely resolve or correct the problem. It really depends on, just bothersome to her. She says that she doesn't even notice it herself is really more than people around her. For now we will hold off on any medications or treatments.

## 2014-06-11 NOTE — Progress Notes (Signed)
   Subjective:    Patient ID: Mia [name], female    DOB: Mar 14, 1951, 63 y.o.   MRN: [id number]  Shortness of Breath   IFG - doing well on current regimen of metformin.   Says feels SOB with physical activity.  Thought might be from her high heart rate. Says was waling on vacation and even her sister noticed she was breathing really hard. Strong family hx of asthma but has never been diagnosed with asthma. No wheezing.  No cough with SOB.  Sometimes hard to take a deep breath. Says even grocery shopping gets her worn out.  Stopped walking outside bc of difficulty of hills for about the last 2 years.  Never smoker.   Head tremor - Has noticed it for a year or two. Mother had a tremor as well. Occ notices  Bilateral hand tremor as well.    Low back pain-she's had pain on and off. It is bilateral and radiates across the hips bilaterally. She has not had any pain radiating into the legs or numbness or tingling or weakness of the lower extremities. She says that she recently traveled to [place] and when she came back she was having such intense pain she was not able to stand up straight for couple days. She rested her back by staying in bed and felt better but then started doing some light housework and started to experience pain again. She actually had to cancel her knee surgery because of this. She is yet to reschedule that.  She also has a place on her third finger on her left hand that she thinks might be getting infected. She said initially she had a piece of skin that was debrided. But now it's turning red and has a white ring around it. She does not remember any trauma or foreign body per se.  Review of Systems  Respiratory: Positive for shortness of breath.        Objective:   Physical Exam  Constitutional: She is oriented to person, place, and time. She appears well-developed and well-nourished.  HENT:  Head: Normocephalic and atraumatic.  Eyes: Conjunctivae are normal. Pupils are  equal, round, and reactive to light.  Neck: Neck supple.  Cardiovascular: Normal rate, regular rhythm and normal heart sounds.   Pulmonary/Chest: Effort normal and breath sounds normal.  Musculoskeletal:  On her middle finger on her left hand she has a slight indentation that has a small blunt and sharp. There is a white ring around it and then some surrounding erythema. No active drainage or pus. Mildly tender to palpation.  Neurological: She is alert and oriented to person, place, and time.  Skin: Skin is warm and dry.  Psychiatric: She has a normal mood and affect. Her behavior is normal.          Assessment & Plan:  Flu shot given today.   Cellulitis of finger-very mild. We'll go ahead and place her on antibiotics. If it's not resolving and please let me know and we may need to consider doing an I&D.

## 2014-06-11 NOTE — Patient Instructions (Signed)

## 2014-06-11 NOTE — Assessment & Plan Note (Signed)
Unclear etiology at this point. It is interesting that her pulse ox down to about 93% with walking. Not even intense exercise. Clearly this is not low enough for her oxygen to do think this warrants further workup with spirometry. She has contributed a lot of her symptoms to her SVT but I think we really need to check out her pulmonary status and make sure that she doesn't have any sign of restrictive or obstructive lung disease or even asthma which does seem to run in her family. We will try to schedule this in the next few weeks.

## 2014-06-11 NOTE — Assessment & Plan Note (Signed)
Noticed. likely musculoskeletal strain. Topic she's getting some spasms in her low back and after recent travel that likely exacerbated her symptoms. I think she'll be a great candidate for physical therapy. She's not currently getting any sciatic type symptoms or other signs of nerve impingement. She is okay with referral. I think they can teach her some exercises he when her back flares and also work on core strengthening.

## 2014-06-13 ENCOUNTER — Ambulatory Visit (INDEPENDENT_AMBULATORY_CARE_PROVIDER_SITE_OTHER): Payer: Medicare HMO | Admitting: Physical Therapy

## 2014-06-13 ENCOUNTER — Ambulatory Visit (INDEPENDENT_AMBULATORY_CARE_PROVIDER_SITE_OTHER): Payer: Medicare HMO | Admitting: Physician Assistant

## 2014-06-13 ENCOUNTER — Ambulatory Visit (INDEPENDENT_AMBULATORY_CARE_PROVIDER_SITE_OTHER): Payer: Medicare HMO

## 2014-06-13 VITALS — BP 121/69 | HR 73 | Temp 97.3°F

## 2014-06-13 DIAGNOSIS — R071 Chest pain on breathing: Secondary | ICD-10-CM

## 2014-06-13 DIAGNOSIS — M545 Low back pain, unspecified: Secondary | ICD-10-CM

## 2014-06-13 DIAGNOSIS — M25512 Pain in left shoulder: Secondary | ICD-10-CM

## 2014-06-13 DIAGNOSIS — R0789 Other chest pain: Secondary | ICD-10-CM

## 2014-06-13 DIAGNOSIS — M899 Disorder of bone, unspecified: Secondary | ICD-10-CM

## 2014-06-13 DIAGNOSIS — R079 Chest pain, unspecified: Secondary | ICD-10-CM

## 2014-06-13 DIAGNOSIS — M949 Disorder of cartilage, unspecified: Secondary | ICD-10-CM

## 2014-06-13 DIAGNOSIS — M2550 Pain in unspecified joint: Secondary | ICD-10-CM

## 2014-06-13 DIAGNOSIS — M25519 Pain in unspecified shoulder: Secondary | ICD-10-CM

## 2014-06-13 DIAGNOSIS — R293 Abnormal posture: Secondary | ICD-10-CM

## 2014-06-13 DIAGNOSIS — M6281 Muscle weakness (generalized): Secondary | ICD-10-CM

## 2014-06-13 DIAGNOSIS — R269 Unspecified abnormalities of gait and mobility: Secondary | ICD-10-CM

## 2014-06-13 MED ORDER — CYCLOBENZAPRINE HCL 10 MG PO TABS: 10.0000 mg | ORAL_TABLET | Freq: Every day | ORAL | Status: DC

## 2014-06-13 MED ORDER — PROMETHAZINE HCL 25 MG PO TABS: 25.0000 mg | ORAL_TABLET | Freq: Four times a day (QID) | ORAL | Status: DC | PRN

## 2014-06-13 MED ORDER — MEPERIDINE HCL 50 MG PO TABS: 50.0000 mg | ORAL_TABLET | ORAL | Status: DC

## 2014-06-13 MED ORDER — KETOROLAC TROMETHAMINE 60 MG/2ML IM SOLN
60.0000 mg | Freq: Once | INTRAMUSCULAR | Status: AC
  Administered 2014-06-13: 60 mg via INTRAMUSCULAR

## 2014-06-13 NOTE — Progress Notes (Signed)
   Subjective:    Patient ID: Mia [name], female    DOB: 1950/11/19, 63 y.o.   MRN: [id number]  HPI Patient is a 63 yo female who presents to the clinic with left shoulder, clavicle and chest wall pain for the last 2 weeks. Pain is waking her up in the middle of the night. She denies any trauma or injury. She rates pain as 10/10. She has tried voltaren gel and ice. Nothing is helping. Worse with any movement.  2 weeks woke up in the middle of the night and put ice on it. volatren gel. She has multiple pain syndromes. She cannot tolerate many narcotics due to allergic reactions. She went to ER and was given demerol and norflex with minimal benefit.    Review of Systems  All other systems reviewed and are negative.      Objective:   Physical Exam  Constitutional: She appears well-developed and well-nourished.  Cardiovascular: Normal rate, regular rhythm and normal heart sounds.   Pulmonary/Chest: Effort normal and breath sounds normal.  Abdominal: Soft. Bowel sounds are normal.  Musculoskeletal:  Pain with palpation over left clavicle.  Pain with palpation over left chest wall.  Pain over anterior shoulder to palpation.   Limited ROm of left shoulder to 80 degrees abduction actively. Full ROM with passive still accompanied by some discomfort. Pain with hawkins.  Strength decreased at 3/5 on left side.   Skin: Skin is dry.  Psychiatric: She has a normal mood and affect. Her behavior is normal.          Assessment & Plan:  Left shoulder/chest wall/clavicel- will get xrays. Will start formal PT. Increased demerol to 50mg  bid. Changed muscle relaxer to flexeril. Toradol 60mg  IM given today. Encouraged ice. Follow up as needed after trial of PT in 4 weeks with PCP.

## 2014-06-18 ENCOUNTER — Encounter: Payer: Medicare HMO | Admitting: Physical Therapy

## 2014-06-20 ENCOUNTER — Ambulatory Visit (INDEPENDENT_AMBULATORY_CARE_PROVIDER_SITE_OTHER): Payer: Medicare HMO | Admitting: Family Medicine

## 2014-06-20 ENCOUNTER — Encounter: Payer: Self-pay | Admitting: Family Medicine

## 2014-06-20 VITALS — BP 120/76 | HR 71 | Ht 66.0 in | Wt 163.0 lb

## 2014-06-20 DIAGNOSIS — Z1211 Encounter for screening for malignant neoplasm of colon: Secondary | ICD-10-CM

## 2014-06-20 DIAGNOSIS — R0602 Shortness of breath: Secondary | ICD-10-CM

## 2014-06-20 MED ORDER — ALBUTEROL SULFATE (2.5 MG/3ML) 0.083% IN NEBU
2.5000 mg | INHALATION_SOLUTION | Freq: Once | RESPIRATORY_TRACT | Status: AC
  Administered 2014-06-20: 2.5 mg via RESPIRATORY_TRACT

## 2014-06-20 NOTE — Addendum Note (Signed)
Addended by: [name] D on: 06/20/2014 12:35 PM   Modules accepted: Orders, Level of Service

## 2014-06-20 NOTE — Progress Notes (Addendum)
   Subjective:    Patient ID: Mia [name], female    DOB: September 07, 1951, 63 y.o.   MRN: [id number]  HPI Note from previous visit:  "Says feels SOB with physical activity. Thought might be from her high heart rate. Says was walking on vacation and even her sister noticed she was breathing really hard. Strong family hx of asthma but has never been diagnosed with asthma. No wheezing. No cough with SOB. Sometimes hard to take a deep breath. Says even grocery shopping gets her worn out. Stopped walking outside bc of difficulty of hills for about the last 2 years. Never smoker".     Her husband is with her for her appointment today.  Review of Systems     Objective:   Physical Exam  Constitutional: She appears well-developed and well-nourished.  HENT:  Head: Normocephalic and atraumatic.  Psychiatric: She has a normal mood and affect. Her behavior is normal.          Assessment & Plan:  SOB - overall spirometry looks good. No sign of obstructive or restrictive disease. Reviewed results with her and answered all questions. She had no significant improvement post bronchodilator. Though she did have a significant increase in her small airway. FVC was 90%, FEV1 89%, ratio of 77.  At this point she still expensing significant shortness of breath or like to refer her to pulmonary for further evaluation. She's seen Dr. Kyung Rudd in the past. She does have a high heart rate with activity and exercise which can also be creating some shortness of breath. Next  Time spent 20 minutes, greater than 50% spent counseling about her shortness of breath.  She also got her reminder letter for her colonoscopy From Dr. Earlean Shawl, unfortunately his office no longer takes her insurance. She would prefer to be scheduled with [facility] gastroenterology. Her daughter has see Dr. Delfin Edis in the past and would like to see her.

## 2014-06-21 ENCOUNTER — Encounter (INDEPENDENT_AMBULATORY_CARE_PROVIDER_SITE_OTHER): Payer: Medicare HMO

## 2014-06-21 DIAGNOSIS — M2550 Pain in unspecified joint: Secondary | ICD-10-CM

## 2014-06-21 DIAGNOSIS — M545 Low back pain, unspecified: Secondary | ICD-10-CM

## 2014-06-21 DIAGNOSIS — R293 Abnormal posture: Secondary | ICD-10-CM

## 2014-06-21 DIAGNOSIS — M6281 Muscle weakness (generalized): Secondary | ICD-10-CM

## 2014-06-21 DIAGNOSIS — R269 Unspecified abnormalities of gait and mobility: Secondary | ICD-10-CM

## 2014-06-25 ENCOUNTER — Encounter: Payer: Medicare HMO | Admitting: Physical Therapy

## 2014-06-25 ENCOUNTER — Ambulatory Visit (INDEPENDENT_AMBULATORY_CARE_PROVIDER_SITE_OTHER): Payer: Medicare HMO | Admitting: Emergency Medicine

## 2014-06-25 ENCOUNTER — Encounter: Payer: Self-pay | Admitting: Sports Medicine

## 2014-06-25 ENCOUNTER — Ambulatory Visit (INDEPENDENT_AMBULATORY_CARE_PROVIDER_SITE_OTHER): Payer: Medicare HMO | Admitting: Sports Medicine

## 2014-06-25 ENCOUNTER — Encounter: Payer: Self-pay | Admitting: Emergency Medicine

## 2014-06-25 VITALS — BP 142/82 | HR 93 | Ht 66.0 in | Wt 165.6 lb

## 2014-06-25 VITALS — BP 116/76 | HR 97 | Ht 66.0 in | Wt 163.0 lb

## 2014-06-25 DIAGNOSIS — G4730 Sleep apnea, unspecified: Secondary | ICD-10-CM

## 2014-06-25 DIAGNOSIS — M25569 Pain in unspecified knee: Secondary | ICD-10-CM

## 2014-06-25 DIAGNOSIS — M25561 Pain in right knee: Secondary | ICD-10-CM

## 2014-06-25 MED ORDER — INDOMETHACIN 50 MG PO CAPS: 50.0000 mg | ORAL_CAPSULE | Freq: Two times a day (BID) | ORAL | Status: DC

## 2014-06-25 NOTE — Assessment & Plan Note (Signed)
Non-compliant w her mask. She believes this is due to fit and frequent leakage. She wants to try nasal pillows but knows she breathes often through her mouth

## 2014-06-25 NOTE — Progress Notes (Signed)
History of Present Illness:  63 yo woman never smoker with hx paroxismal SVT, Holter showed correlation at the time with exertional dyspnea and lightheadedness. PFT, methacholine and CPEX were reassuring except exercise limited by tachycardia. Has undergone CPAP titration and needs 14cm H2O pressure   ROV 07/30/09 -- Not using nasal pillows, using the nasal mask. Has been switched to auto-titration device due to difficulty tolerating 14cm H2O. Has had better compliance with latest mask. Wears it every night, When she wakes up in the middle of the night she takes it off. Can't really tell any difference in how she feels during the day - does have less fibromyalgia pain since she's been using it.   January 10, 2010--Presents for acute office visit. Complains of occ pain in left upper back wrapping around to the left breast when she takes a deep breath x8weeks. Happens intermittently, sore to touch, pain is under shoulder blade w/ radiation around to rbs. Use muscle relaxer patch did not help, no other meds used. Denies chest pain, dyspnea, orthopnea, hemoptysis, fever, n/v/d, edema, headache,recent travel or antibiotics.   ROV 08/25/10 -- Hx OSA and HTN. Was started on diltiazem about 6 weeks ago. Began to develop B LE edema, L>R, about 2 weeks ago. US showed no DVT, CT scan chest done, no PE showed "? PNA" so treated with levaquin. She had absolutely no symptoms at that time. She has been having some exertional dyspnea, occasionally feels her tachycardia, not sure the two correlate. Tells me she hasn't worn her CPAP in about 4 months because her mask is hurting her face.   ROV 09/04/10 -- Returns to f/u her edema, exertional SOB. I performed 24h urine, TTE last time - both are reassuring. She has not been wearing her CPAP mask reliably, needs a new mask.   ROV 02/08/13 -- return visit with hx OSA (on CPAP), HTN, SVT, exertional dyspnea s/p extensive w/u (negative methacholine, reassuring CPST. She was seen  by Dr Acie Fredrickson 01/24/13 for similar sx, tachycardia. Underwent TTE and stress testing >> normal. Her HR and BP were high. She has been off of CPAP for two years. She started back 2 weeks ago. Just got a new full face mask > having leakage problem. [employer] is the company. She says she is on auto-set, not 14cm H2O.   ROV 06/25/14 -- follows up for chronic dyspnea (w/u as above), OSA. She has been having trouble with R knee pain, R great toe pain. She has has hx pseudogout. She has been wearing her CPAP very rarely. She has the most difficulty trying to go to sleep, often gets waked up by a leak. She had spirometry 06/20/14 >> normal AF's, ? Small airways responsiveness. Her methacholine has previously been negative.     Filed Vitals:   06/25/14 1353  BP: 142/82  Pulse: 93  Height: 5\' 6"  (1.676 m)  Weight: 165 lb 9.6 oz (75.116 kg)  SpO2: 100%   Gen: Pleasant, well-nourished, in no distress,  normal affect  ENT: No lesions,  mouth clear,  oropharynx clear, no postnasal drip  Neck: No JVD, no TMG, no carotid bruits  Lungs: No use of accessory muscles, no dullness to percussion, clear without rales or rhonchi  Cardiovascular: RRR, heart sounds normal, no murmur or gallops, no peripheral edema  Musculoskeletal: No deformities, no cyanosis or clubbing  Neuro: alert, non focal  Skin: Warm, no lesions or rashes    Cardiac Stress Test 02/01/13: Impression  Exercise Capacity: Lexiscan with low  level exercise.  BP Response: Normal blood pressure response.  Clinical Symptoms: There is dyspnea.  ECG Impression: No significant ST segment change suggestive of ischemia.  Comparison with Prior Nuclear Study: No significant change from previous study  Overall Impression: Normal stress nuclear study. No evidence of ischemia. Heart rate accelerated very rapidly on walking lexiscan protocol.  LV Ejection Fraction: 59%. LV Wall Motion: NL LV Function; NL Wall Motion  SLEEP APNEA Non-compliant w her mask. She  believes this is due to fit and frequent leakage. 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 Suspect a flare - discussed possible prednisone. I feel she needs to see her orthopedist to discuss.

## 2014-06-25 NOTE — Patient Instructions (Signed)
We will try changing your CPAP machine, update the device.  We will try changing your mask to nasal pillows with a chin strap.  Follow with Dr Lamonte Sakai in 12 months or sooner if you have any problems

## 2014-06-25 NOTE — Assessment & Plan Note (Signed)
Increasing in swelling, pain, warmth. This resembles crystalline arthropathy. Aspiration and injection as above, fluid was slightly cloudy. Sent off for cell count, crystal analysis and culture. Return to see me on September 9 before they leave for [place]. Adding indomethacin.

## 2014-06-25 NOTE — Assessment & Plan Note (Signed)
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 Suspect a flare - discussed possible prednisone. I feel she needs to see her orthopedist to discuss.

## 2014-06-25 NOTE — Progress Notes (Signed)
  Subjective:    CC: Right knee swelling  HPI: Mia [name] is a very pleasant 63 year old female with a questionable history of pseudogout who comes in with a several-day history of increasing pain, swelling, and warmth in her right knee. Pain is severe, persistent. It does not radiate. She has never had an arthrocentesis for diagnosis. She does have significant distant trauma in the right knee, and is currently a candidate for right knee arthroplasty. No fevers, chills, night sweats, weight loss.  Past medical history, Surgical history, Family history not pertinant except as noted below, Social history, Allergies, and medications have been entered into the medical record, reviewed, and no changes needed.   Review of Systems: No fevers, chills, night sweats, weight loss, chest pain, or shortness of breath.   Objective:    General: Well Developed, well nourished, and in no acute distress.  Neuro: Alert and oriented x3, extra-ocular muscles intact, sensation grossly intact.  HEENT: Normocephalic, atraumatic, pupils equal round reactive to light, neck supple, no masses, no lymphadenopathy, thyroid nonpalpable.  Skin: Warm and dry, no rashes. Cardiac: Regular rate and rhythm, no murmurs rubs or gallops, no lower extremity edema.  Respiratory: Clear to auscultation bilaterally. Not using accessory muscles, speaking in full sentences. Right Knee: Visibly swollen and warm with a palpable fluid wave. ROM normal in flexion and extension and lower leg rotation. Ligaments with solid consistent endpoints including ACL, PCL, LCL, MCL. Negative Mcmurray's and provocative meniscal tests. Non painful patellar compression. Patellar and quadriceps tendons unremarkable. Hamstring and quadriceps strength is normal.  Procedure: Real-time Ultrasound Guided aspiration/Injection of right knee Device: GE Logiq E  Verbal informed consent obtained.  Time-out conducted.  Noted no overlying erythema, induration, or  other signs of local infection.  Skin prepped in a sterile fashion.  Local anesthesia: Topical Ethyl chloride.  With sterile technique and under real time ultrasound guidance:  20 cc of straw-colored slightly cloudy fluid aspirated, syringe switched and 2 cc Kenalog 40, 4 cc lidocaine injected easily.  Completed without difficulty  Pain immediately resolved suggesting accurate placement of the medication.  Advised to call if fevers/chills, erythema, induration, drainage, or persistent bleeding.  Images permanently stored and available for review in the ultrasound unit.  Impression: Technically successful ultrasound guided injection.  Impression and Recommendations:

## 2014-06-27 ENCOUNTER — Encounter: Payer: Medicare HMO | Admitting: Physical Therapy

## 2014-06-28 ENCOUNTER — Telehealth: Payer: Self-pay | Admitting: Sports Medicine

## 2014-06-28 LAB — SYNOVIAL CELL COUNT + DIFF, W/ CRYSTALS
Crystals, Fluid: NONE SEEN
Eosinophils-Synovial: 0 % (ref 0–1)
Lymphocytes-Synovial Fld: 3 % (ref 0–20)
Monocyte/Macrophage: 70 % (ref 50–90)
Neutrophil, Synovial: 26 % — ABNORMAL HIGH (ref 0–25)
WBC, Synovial: 915 cu mm — ABNORMAL HIGH (ref 0–200)

## 2014-06-28 MED ORDER — DICLOFENAC SODIUM 75 MG PO TBEC: 75.0000 mg | DELAYED_RELEASE_TABLET | Freq: Two times a day (BID) | ORAL | Status: DC

## 2014-06-28 NOTE — Telephone Encounter (Signed)
Mia [name] called and said the medication you called in for her is not covered by her insurance and was wanting to know if their was something else you could give her. Please call her and let her know.

## 2014-06-28 NOTE — Telephone Encounter (Signed)
No narcotics for this, she is allergic to all other narcotics which is worrisome, I would recommend no further narcotics at all.

## 2014-06-28 NOTE — Telephone Encounter (Signed)
Pt notified of diclofenac being sent.  She is asking if you could prescribe morphine in place of demerol for her.

## 2014-06-28 NOTE — Telephone Encounter (Signed)
Switching to diclofenac

## 2014-06-29 ENCOUNTER — Other Ambulatory Visit: Payer: Self-pay | Admitting: Neurology

## 2014-07-01 LAB — BODY FLUID CULTURE
Gram Stain: NONE SEEN
Organism ID, Bacteria: NO GROWTH

## 2014-07-04 ENCOUNTER — Encounter: Payer: Self-pay | Admitting: Sports Medicine

## 2014-07-04 ENCOUNTER — Ambulatory Visit (INDEPENDENT_AMBULATORY_CARE_PROVIDER_SITE_OTHER): Payer: Medicare HMO | Admitting: Sports Medicine

## 2014-07-04 VITALS — BP 138/81 | HR 84 | Wt 163.0 lb

## 2014-07-04 DIAGNOSIS — M25561 Pain in right knee: Secondary | ICD-10-CM

## 2014-07-04 DIAGNOSIS — M25569 Pain in unspecified knee: Secondary | ICD-10-CM

## 2014-07-04 NOTE — Progress Notes (Signed)
  Subjective:    CC: Recheck knee pain  HPI: This is a very pleasant 63 year old female, she does have rheumatic disease, we injected her knee at the last visit, it is post polytrauma, she has multiple internal derangements, overall she is significantly better. She wonders what else can be done for her polyarthralgia. We discussed her medical history in detail.  Past medical history, Surgical history, Family history not pertinant except as noted below, Social history, Allergies, and medications have been entered into the medical record, reviewed, and no changes needed.   Review of Systems: No fevers, chills, night sweats, weight loss, chest pain, or shortness of breath.   Objective:    General: Well Developed, well nourished, and in no acute distress.  Neuro: Alert and oriented x3, extra-ocular muscles intact, sensation grossly intact.  HEENT: Normocephalic, atraumatic, pupils equal round reactive to light, neck supple, no masses, no lymphadenopathy, thyroid nonpalpable.  Skin: Warm and dry, no rashes. Cardiac: Regular rate and rhythm, no murmurs rubs or gallops, no lower extremity edema.  Respiratory: Clear to auscultation bilaterally. Not using accessory muscles, speaking in full sentences.  Impression and Recommendations:    I spent 40 minutes with this patient, greater than 50% was face-to-face time counseling regarding the above diagnosis.

## 2014-07-04 NOTE — Assessment & Plan Note (Signed)
Again, continues to be a candidate for knee arthroplasty. Did very well after injection. Continue as needed indomethacin. Aspirated fluid was negative for crystals. She is seeing a rheumatologist, and does have some interphalangeal joint involvement, I am happy to do interphalangeal joint injections on an as-needed basis.

## 2014-07-04 NOTE — Patient Instructions (Signed)
Talk to your rheumatologist about hydroxychloroquine.

## 2014-07-09 ENCOUNTER — Telehealth: Payer: Self-pay | Admitting: Internal Medicine

## 2014-07-11 ENCOUNTER — Telehealth: Payer: Self-pay | Admitting: *Deleted

## 2014-07-19 NOTE — Telephone Encounter (Signed)
Error

## 2014-07-24 ENCOUNTER — Telehealth: Payer: Self-pay | Admitting: Family Medicine

## 2014-07-24 NOTE — Telephone Encounter (Signed)
Please call pt: needs to schedule a pre-op clearance

## 2014-07-24 NOTE — Telephone Encounter (Signed)
Called pt & transferred her to scheduling.

## 2014-07-25 ENCOUNTER — Telehealth: Payer: Self-pay | Admitting: Cardiovascular Disease

## 2014-07-25 NOTE — Telephone Encounter (Signed)
Received request from Nurse fax box, documents faxed for surgical clearance. To: [facility] Fax number: [phone] Attentionn [phone]

## 2014-07-26 ENCOUNTER — Encounter: Payer: Self-pay | Admitting: Internal Medicine

## 2014-07-27 ENCOUNTER — Emergency Department (HOSPITAL_COMMUNITY)
Admission: EM | Admit: 2014-07-27 | Discharge: 2014-07-27 | Disposition: A | Discharge status: Home / Self Care | Payer: Medicare HMO | Attending: Emergency Medicine | Admitting: Emergency Medicine

## 2014-07-27 ENCOUNTER — Encounter (HOSPITAL_COMMUNITY): Payer: Self-pay | Admitting: Emergency Medicine

## 2014-07-27 DIAGNOSIS — Z8669 Personal history of other diseases of the nervous system and sense organs: Secondary | ICD-10-CM | POA: Diagnosis not present

## 2014-07-27 DIAGNOSIS — D649 Anemia, unspecified: Secondary | ICD-10-CM | POA: Insufficient documentation

## 2014-07-27 DIAGNOSIS — M1990 Unspecified osteoarthritis, unspecified site: Secondary | ICD-10-CM | POA: Diagnosis not present

## 2014-07-27 DIAGNOSIS — Z79899 Other long term (current) drug therapy: Secondary | ICD-10-CM | POA: Insufficient documentation

## 2014-07-27 DIAGNOSIS — I10 Essential (primary) hypertension: Secondary | ICD-10-CM | POA: Diagnosis not present

## 2014-07-27 DIAGNOSIS — I471 Supraventricular tachycardia: Secondary | ICD-10-CM | POA: Diagnosis not present

## 2014-07-27 DIAGNOSIS — E119 Type 2 diabetes mellitus without complications: Secondary | ICD-10-CM | POA: Insufficient documentation

## 2014-07-27 DIAGNOSIS — M797 Fibromyalgia: Secondary | ICD-10-CM | POA: Insufficient documentation

## 2014-07-27 DIAGNOSIS — Z9889 Other specified postprocedural states: Secondary | ICD-10-CM | POA: Insufficient documentation

## 2014-07-27 DIAGNOSIS — K219 Gastro-esophageal reflux disease without esophagitis: Secondary | ICD-10-CM | POA: Diagnosis not present

## 2014-07-27 DIAGNOSIS — D72829 Elevated white blood cell count, unspecified: Secondary | ICD-10-CM | POA: Insufficient documentation

## 2014-07-27 DIAGNOSIS — Z7952 Long term (current) use of systemic steroids: Secondary | ICD-10-CM | POA: Insufficient documentation

## 2014-07-27 DIAGNOSIS — R7989 Other specified abnormal findings of blood chemistry: Secondary | ICD-10-CM | POA: Diagnosis present

## 2014-07-27 LAB — CBC WITH DIFFERENTIAL/PLATELET
Basophils Absolute: 0 10*3/uL (ref 0.0–0.1)
Basophils Relative: 0 % (ref 0–1)
Eosinophils Absolute: 0.2 10*3/uL (ref 0.0–0.7)
Eosinophils Relative: 2 % (ref 0–5)
HCT: 38.4 % (ref 36.0–46.0)
Hemoglobin: 12.8 g/dL (ref 12.0–15.0)
Lymphocytes Relative: 29 % (ref 12–46)
Lymphs Abs: 2.3 10*3/uL (ref 0.7–4.0)
MCH: 32.3 pg (ref 26.0–34.0)
MCHC: 33.3 g/dL (ref 30.0–36.0)
MCV: 97 fL (ref 78.0–100.0)
Monocytes Absolute: 0.6 10*3/uL (ref 0.1–1.0)
Monocytes Relative: 8 % (ref 3–12)
Neutro Abs: 4.8 10*3/uL (ref 1.7–7.7)
Neutrophils Relative %: 61 % (ref 43–77)
Platelets: 402 10*3/uL — ABNORMAL HIGH (ref 150–400)
RBC: 3.96 MIL/uL (ref 3.87–5.11)
RDW: 14.4 % (ref 11.5–15.5)
WBC: 7.9 10*3/uL (ref 4.0–10.5)

## 2014-07-27 NOTE — ED Notes (Signed)
Pt presents reporting she had blood drawn today and had WBC 70,000.  Pt reports that she has RA and has to have lab work every two weeks.  Sts intermittent night sweats and intermittent fatigue x 2 months.  Esmond Harps MD is aware that she is coming and is supposed to come and see her.

## 2014-07-27 NOTE — ED Provider Notes (Signed)
CSN: [id number]     Arrival date & time 07/27/14  1723 History   First MD Initiated Contact with Patient 07/27/14 1749     Chief Complaint  Patient presents with  . Abnormal Lab    HPI Pt has routine blood testing performed to monitor her RA by Dr [name].  She had a WBC count previously that was 19k.  She had repeat testing this week and was told her WBC was up to 70k.  She was told her doctor spoke with Dr Earlie Server.  Pt was told to come to the ED.  She otherwise is feeling her usual self although she has noticed increased fatigue over the last several months and occasional night sweats.  No fevers. No weight loss.  No change with appetite.  She does have knee pain but that is not new for her. Past Medical History  Diagnosis Date  . SVT (supraventricular tachycardia)   . Sleep apnea   . Raynaud phenomenon   . Edema   . Anemia   . Arthritis   . Blood transfusion   . Diabetes mellitus   . GERD (gastroesophageal reflux disease)     gastritis  . Hypertension   . Neuromuscular disorder     sclerosis  . Neuromuscular disorder     fibromyalgia  . Pseudogout   . Thyroid disease     hypothyroidism  . Hemorrhoids   . Rectal bleeding   . PONV (postoperative nausea and vomiting)   . Memory difficulty 12/14/2013  . Fibromyalgia    Past Surgical History  Procedure Laterality Date  . Cholecystectomy    . Appendectomy    . Tonsillectomy    . Hysterectomy - unknown type    . Knee surgery      x6  . Oophorectomy    . Dilation and curettage of uterus    . Breast biopsy    . Shoulder surgery      bilateral- bones spur  . Neck surgery      fusion  . Quadriceps repair      right  . Bladder surgery      x2  . Cataract extraction      bilatera  . Rectocele repair      x2  . Enterocele repair      x2  . Cardiac catheterization    . Eye surgery      retina  . Shoulder arthroscopy with subacromial decompression, rotator cuff repair and bicep tendon repair  10/06/2012   Procedure: SHOULDER ARTHROSCOPY WITH SUBACROMIAL DECOMPRESSION, ROTATOR CUFF REPAIR AND BICEP TENDON REPAIR;  Surgeon: Nita Sells, MD;  Location: [place];  Service: Orthopedics;  Laterality: Right;  Arthroscopic  Repair  of  Subscapularis, Open Biceps Tenodesis  . Bladder surgery     Family History  Problem Relation Age of Onset  . Heart attack    . Stroke    . Diabetes    . Cancer Maternal Grandmother     breast  . Cancer Maternal Grandfather     pancreatic  . Heart disease Mother   . Heart disease Father   . Pulmonary fibrosis Sister   . Stroke Sister   . Lupus Sister   . Asthma Sister    History  Substance Use Topics  . Smoking status: Never Smoker   . Smokeless tobacco: Never Used  . Alcohol Use: No   OB History   Grav Para Term Preterm Abortions TAB SAB Ect Mult Living  Review of Systems  All other systems reviewed and are negative.     Allergies  Percocet; Celecoxib; Codeine; Erythromycin; Hydrocodone; Ketorolac tromethamine; Nitrofurantoin; Oxycodone-acetaminophen; Pregabalin; Tramadol; Verapamil; and Darvocet  Home Medications   Prior to Admission medications   Medication Sig Start Date End Date Taking? Authorizing Provider  B-D TB SYRINGE 1CC/27GX1/2" 27G X 1/2" 1 ML MISC Inject 1 each into the muscle once a week. 11/13/13  Yes Historical Provider, MD  calcium carbonate (OS-CAL) 600 MG TABS Take 600 mg by mouth 2 (two) times daily with a meal.     Yes Historical Provider, MD  cyclobenzaprine (FLEXERIL) 10 MG tablet Take 1 tablet (10 mg total) by mouth at bedtime. 06/13/14  Yes Jade L Breeback, PA-C  diclofenac sodium (VOLTAREN) 1 % GEL Apply 2 g topically 3 (three) times daily as needed (join pain).    Yes Historical Provider, MD  diltiazem (CARDIZEM CD) 360 MG 24 hr capsule Take 1 capsule (360 mg total) by mouth daily. 01/15/14  Yes Thayer Headings, MD  donepezil (ARICEPT) 10 MG tablet Take 1 tablet (10 mg total) by  mouth at bedtime. 01/29/14  Yes Kathrynn Ducking, MD  folic acid (FOLVITE) 1 MG tablet Take 1 mg by mouth 2 (two) times daily.  07/29/12  Yes Historical Provider, MD  furosemide (LASIX) 20 MG tablet Take 1 tablet (20 mg total) by mouth daily. 03/09/14  Yes Hali Marry, MD  metFORMIN (GLUCOPHAGE) 500 MG tablet Take 1 tablet (500 mg total) by mouth 2 (two) times daily with a meal. 03/13/14  Yes Hali Marry, MD  Multiple Vitamin (MULTIVITAMIN) capsule Take 1 capsule by mouth daily.   Yes Historical Provider, MD  pantoprazole (PROTONIX) 40 MG tablet Take 1 tablet (40 mg total) by mouth daily. 03/09/14  Yes Hali Marry, MD  AMBULATORY NON FORMULARY MEDICATION Medication Name: One Touch Ultra extra fine 33 gauge. Check fasting blood sugar every morning and as needed. Dx of Impaired Fasting Glucose Fax to [phone] 03/27/14   Hali Marry, MD  diclofenac (VOLTAREN) 75 MG EC tablet Take 1 tablet (75 mg total) by mouth 2 (two) times daily. 06/28/14   Silverio Decamp, MD  propranolol (INDERAL) 10 MG tablet Take 10 mg by mouth 3 (three) times daily as needed.     Historical Provider, MD  vitamin D, CHOLECALCIFEROL, 400 UNITS tablet Take 400 Units by mouth 2 (two) times daily.      Historical Provider, MD   BP 157/94  Pulse 85  Temp(Src) 98.2 F (36.8 C) (Oral)  Resp 16  SpO2 100% Physical Exam  Nursing note and vitals reviewed. Constitutional: She appears well-developed and well-nourished. No distress.  HENT:  Head: Normocephalic and atraumatic.  Right Ear: External ear normal.  Left Ear: External ear normal.  Eyes: Conjunctivae are normal. Right eye exhibits no discharge. Left eye exhibits no discharge. No scleral icterus.  Neck: Neck supple. No tracheal deviation present.  Cardiovascular: Normal rate, regular rhythm and intact distal pulses.   Pulmonary/Chest: Effort normal and breath sounds normal. No stridor. No respiratory distress. She has no wheezes. She has  no rales.  Abdominal: Soft. Bowel sounds are normal. She exhibits no distension. There is no tenderness. There is no rebound and no guarding.  Musculoskeletal: She exhibits no edema and no tenderness.  Neurological: She is alert. She has normal strength. No cranial nerve deficit (no facial droop, extraocular movements intact, no slurred speech) or sensory deficit. She exhibits normal muscle  tone. She displays no seizure activity. Coordination normal.  Skin: Skin is warm and dry. No rash noted.  Psychiatric: She has a normal mood and affect.    ED Course  Procedures (including critical care time) Labs Review Labs Reviewed  CBC WITH DIFFERENTIAL  PATHOLOGIST SMEAR REVIEW     MDM   Final diagnoses:  Leukocytosis    Discussed the case with Dr Osker Mason.  We reviewed her blood tests form [facility].  There is no blood smear to review so there is chance of acute leukemia but unlikely.  Suspect CLL.   Pt could follow up as an outpatient if she was comfortable with this.  Admission with inpatient workup is an option as well.  I discussed this with the patient.  She would like to go home.  Dr Osker Mason requested a CBC with smear review.  The patient will follow up in the oncologists office and he will review the blood testing.  Dorie Rank, MD 07/27/14 [phone]

## 2014-07-27 NOTE — Discharge Instructions (Signed)
Chronic Lymphocytic Leukemia  Chronic lymphocytic leukemia (CLL) is a type of cancer of the bone marrow and blood cells. Bone marrow is the soft, spongy tissue inside your bone. In CLL, the bone marrow makes too many white blood cells that usually fight infection in the body (lymphocytes). CLL usually gets worse slowly and is the most common type of adult leukemia.   RISK FACTORS  No one knows the exact cause of CLL. There is a higher risk of CLL in people who:   · Are older than 50 years.  · Are white.  · Are female.  · Have a family history of CLL or other cancers of the lymph system.  · Are of Russian Jewish or Eastern European Jewish descent.  · Have been exposed to certain chemicals, such as Agent Orange (used in the Vietnam War) or other herbicides or insecticides.  SYMPTOMS   At first, there may be no symptoms of chronic lymphocytic leukemia. After a while, some symptoms may occur, such as:   · Feeling more tired than usual, even after rest.  · Unplanned weight loss.  · Heavy sweating at night.  · Fevers.  · Shortness of breath.  · Decreased energy.  · Paleness.  · Painless, swollen lymph nodes.  · A feeling of fullness in the upper left part of the abdomen.  · Easy bruising or bleeding.  · More frequent infections.  DIAGNOSIS   Your health care provider may perform the following exams and tests to diagnose CLL:  · Physical exam to check for an enlarged spleen, liver, or lymph nodes.  · Blood and bone marrow tests to identify the presence of cancer cells. These may include tests such as complete blood count, flow cytometry, immunophenotyping, and fluorescence in situ hybridization (FISH).  · CT scan to look for swelling or abnormalities in your spleen, liver, and lymph nodes.  TREATMENT   Treatment options for CLL depend on the stage and the presence of symptoms. There are a number of types of treatment used for this condition, including:  · Observation.  · Targeted drugs. These are drugs that interfere with  chemicals that leukemia cells need in order to grow and multiply. They identify and attack specific cancer cells without harming normal cells.  · Chemotherapy drugs. These medicines kill cells that are multiplying quickly, such as leukemia cells.  · Radiation.  · Surgery to remove the spleen.  · Biological therapy. This treatment boosts the ability of your own immune system to fight the leukemia cells.  · Bone marrow or peripheral blood stem cell transplant. This treatment allows the patient to receive very high doses of chemotherapy or radiation or both. These high doses kill the cancer cells but also destroy the bone marrow. After treatment is complete, you are given donor bone marrow or stem cells, which will replace the bone marrow.  HOME CARE INSTRUCTIONS   · Because you have an increased risk of infection, practice good hand washing and avoid being around people who are ill or being in crowded places.  · Because you have an increased risk of bleeding and bruising, avoid contact sports or other rough activities.  · Take medicines only directed by your health care provider.  · Although some of your treatments might affect your appetite, try to eat regular, healthy meals.  · If you develop any side effects, such as nausea, diarrhea, rash, white patches in your mouth, a sore throat, difficulty swallowing, or severe fatigue, tell your health   care provider. He or she may have recommendations of things you can do to improve symptoms.  · Consider learning some ways to cope with the stress of having a chronic illness, such as yoga, meditation, or participating in a support group.  SEEK MEDICAL CARE IF:  · You develop chest pains.  · You notice pain, swelling or redness anywhere in your legs.  · You have pain in your belly (abdomen).  · You develop new bruises that are getting bigger.  · You have painful or more swollen lymph nodes.  · You develop bleeding from your gums, nose, or in your urine or stools.  · You are  unable to stop throwing up (vomiting).  · You cannot keep liquids down.  · You feel light-headed.  · You have a fever.  · You develop a severe stiff neck or headache.  SEEK IMMEDIATE MEDICAL CARE IF:  · You have trouble breathing or feel short of breath.  · You faint.  Document Released: 02/28/2009 Document Revised: 02/26/2014 Document Reviewed: 04/06/2013  Exit[employer]® Patient Information ©2015 [employer]. This information is not intended to replace advice given to you by your health care provider. Make sure you discuss any questions you have with your health care provider.

## 2014-07-30 ENCOUNTER — Encounter: Payer: Self-pay | Admitting: Oncology

## 2014-07-30 NOTE — Progress Notes (Signed)
I was asked by Dr. Tomi Bamberger that with at [facility] emergency department to comment on [name] abnormal laboratory testing on Friday October 2. She had a CBC done with Dr. [name] on 07/27/2014 and showed a white cell count of 70,000. Her CBC also showed a normal hemoglobin and platelets and normal differential. 2 weeks prior to that she had a white cell count 18,000 down in [place]. During her visit to the emergency department on 07/27/2014, we asked for a repeat CBC and a peripheral smear to be done. Her white cell count showed to be normal at 7.9  with perfectly normal differential. Her smear review showed morphologically normal findings.  I communicated these findings to [name] today over the phone and I feel there is less likely a hematological condition based on a perfectly normal CBC that was obtained on 07/27/2014 at [facility] emergency department. I told her that I cannot explain by her high white cell count on 2 separate occasions. This can be a reactive process or a lab error. I told her that I feel there is no need to hematological workup at this time but I do encourage a repeat CBC in the future. She told me that she has a preoperative clearance in preparation for a hip surgery and encourage her to obtain a CBC with her primary care physician as a part of the preoperative clearance.  If her CBC is continued to be perfectly normal, then no hematological evaluation is needed. If her laboratory testing indeed showed an elevated white cell count at that time, then she will require hematology consult.  All her questions were answered today and she understands the current plan. I will forward this correspondence to the patient's primary care physician Dr. Madilyn Fireman and Dr. [name].

## 2014-07-31 ENCOUNTER — Encounter: Payer: Self-pay | Admitting: Family Medicine

## 2014-07-31 ENCOUNTER — Ambulatory Visit (INDEPENDENT_AMBULATORY_CARE_PROVIDER_SITE_OTHER): Payer: Medicare HMO | Admitting: Family Medicine

## 2014-07-31 VITALS — BP 132/75 | HR 78 | Ht 66.0 in | Wt 166.0 lb

## 2014-07-31 DIAGNOSIS — D72829 Elevated white blood cell count, unspecified: Secondary | ICD-10-CM

## 2014-07-31 DIAGNOSIS — R110 Nausea: Secondary | ICD-10-CM

## 2014-07-31 DIAGNOSIS — I10 Essential (primary) hypertension: Secondary | ICD-10-CM

## 2014-07-31 DIAGNOSIS — R112 Nausea with vomiting, unspecified: Secondary | ICD-10-CM

## 2014-07-31 DIAGNOSIS — Z01818 Encounter for other preprocedural examination: Secondary | ICD-10-CM

## 2014-07-31 DIAGNOSIS — Z9889 Other specified postprocedural states: Secondary | ICD-10-CM

## 2014-07-31 DIAGNOSIS — K623 Rectal prolapse: Secondary | ICD-10-CM

## 2014-07-31 LAB — PATHOLOGIST SMEAR REVIEW

## 2014-07-31 MED ORDER — DICLOFENAC SODIUM 75 MG PO TBEC: 75.0000 mg | DELAYED_RELEASE_TABLET | Freq: Two times a day (BID) | ORAL | Status: DC

## 2014-07-31 NOTE — Progress Notes (Signed)
Subjective:    Patient ID: Mia [name], female    DOB: 10/13/1951, 63 y.o.   MRN: [id number]  HPI  Here today for preoperative evaluation for right total knee replacement. She is seeing Dr. Ballard Russell orthotic [place] in [place]. She is a 63 year old female with a prior history significant for paroxysmal supraventricular tachycardia and hypertension. No known ischemic disease. She has had a prior history of postop nausea and vomiting.  She also has a concern today about rectal prolapse. Unfortunately she has had a repair done twice. He now she is noticing her to the rectum is prolapsing again. It is making it very difficult to have a bowel movement. She says that her stools are soft were normally she's a little but more constipated. The she almost feels like a softer stools are making it harder to pass the and applying pressure to the perineum but has not really been helping.  Patient Active Problem List   Diagnosis Date Noted  . SOB (shortness of breath) 06/11/2014  . Benign head tremor 06/11/2014  . Low back pain 06/11/2014  . IFG (impaired fasting glucose) 03/09/2014  . Osteopenia 03/09/2014  . IC (interstitial cystitis) 03/09/2014  . Hemorrhoid 03/09/2014  . Retinal wrinkling, right eye 03/09/2014  . Fibromyalgia 03/09/2014  . GERD (gastroesophageal reflux disease) 03/09/2014  . Right knee pain 01/31/2014  . Memory difficulty 12/14/2013  . Abnormality of gait 12/14/2013  . Chest tightness 01/24/2013  . Hemorrhoids, external, thrombosed 06/22/2011  . Hypertension 03/11/2011  . SVT (supraventricular tachycardia)   . Raynaud phenomenon   . EDEMA 08/25/2010  . Nonspecific (abnormal) findings on radiological and other examination of body structure 08/25/2010  . COMPUTERIZED TOMOGRAPHY, CHEST, ABNORMAL 08/25/2010  . SLEEP APNEA 04/24/2009  . 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 ALLERGIC RHINITIS 06/11/2008  . OSTEOARTHRITIS 06/11/2008  . DYSPNEA ON EXERTION 06/11/2008  .  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 DIASTOLIC DYSFUNCTION 35/32/9924  . INTERSTITIAL CYSTITIS 06/08/2008      Review of Systems  BP 132/75  Pulse 78  Ht 5\' 6"  (1.676 m)  Wt 166 lb (75.297 kg)  BMI 26.81 kg/m2    Allergies  Allergen Reactions  . Percocet [Oxycodone-Acetaminophen] Nausea And Vomiting  . Celecoxib     Other reaction(s): Other flushed  . Codeine Nausea And Vomiting  . Erythromycin Nausea And Vomiting  . Hydrocodone Nausea And Vomiting  . Ketorolac Tromethamine Nausea And Vomiting  . Nitrofurantoin Nausea And Vomiting  . Oxycodone-Acetaminophen Nausea And Vomiting  . Pregabalin Swelling    legs  . Tramadol Nausea And Vomiting  . Verapamil Nausea And Vomiting    REACTION: intolerance  . Darvocet [Propoxyphene N-Acetaminophen] Nausea And Vomiting    Past Medical History  Diagnosis Date  . SVT (supraventricular tachycardia)   . Sleep apnea   . Raynaud phenomenon   . Edema   . Anemia   . Arthritis   . Blood transfusion   . Diabetes mellitus   . GERD (gastroesophageal reflux disease)     gastritis  . Hypertension   . Neuromuscular disorder     sclerosis  . Neuromuscular disorder     fibromyalgia  . Pseudogout   . Thyroid disease     hypothyroidism  . Hemorrhoids   . Rectal bleeding   . PONV (postoperative nausea and vomiting)   . Memory difficulty 12/14/2013  . Fibromyalgia     Past Surgical History  Procedure Laterality Date  . Cholecystectomy    . Appendectomy    .  Tonsillectomy    . Hysterectomy - unknown type    . Knee surgery      x6  . Oophorectomy    . Dilation and curettage of uterus    . Breast biopsy    . Shoulder surgery      bilateral- bones spur  . Neck surgery      fusion  . Quadriceps repair      right  . Bladder surgery      x2  . Cataract extraction      bilatera  . Rectocele repair      x2  . Enterocele repair      x2  . Cardiac catheterization    . Eye surgery      retina  . Shoulder  arthroscopy with subacromial decompression, rotator cuff repair and bicep tendon repair  10/06/2012    Procedure: SHOULDER ARTHROSCOPY WITH SUBACROMIAL DECOMPRESSION, ROTATOR CUFF REPAIR AND BICEP TENDON REPAIR;  Surgeon: Nita Sells, MD;  Location: [facility];  Service: Orthopedics;  Laterality: Right;  Arthroscopic  Repair  of  Subscapularis, Open Biceps Tenodesis  . Bladder surgery      History   Social History  . Marital Status: Married    Spouse Name: N/A    Number of Children: 2  . Years of Education: N/A   Occupational History  . Retired    Social History Main Topics  . Smoking status: Never Smoker   . Smokeless tobacco: Never Used  . Alcohol Use: No  . Drug Use: No  . Sexual Activity: Not on file   Other Topics Concern  . Not on file   Social History Narrative  . No narrative on file    Family History  Problem Relation Age of Onset  . Heart attack    . Stroke    . Diabetes    . Cancer Maternal Grandmother     breast  . Cancer Maternal Grandfather     pancreatic  . Heart disease Mother   . Heart disease Father   . Pulmonary fibrosis Sister   . Stroke Sister   . Lupus Sister   . Asthma Sister     Outpatient Encounter Prescriptions as of 07/31/2014  Medication Sig  . AMBULATORY NON FORMULARY MEDICATION Medication Name: One Touch Ultra extra fine 33 gauge. Check fasting blood sugar every morning and as needed. Dx of Impaired Fasting Glucose Fax to [phone]  . B-D TB SYRINGE 1CC/27GX1/2" 27G X 1/2" 1 ML MISC Inject 1 each into the muscle once a week.  . calcium carbonate (OS-CAL) 600 MG TABS Take 600 mg by mouth 2 (two) times daily with a meal.    . cyclobenzaprine (FLEXERIL) 10 MG tablet Take 1 tablet (10 mg total) by mouth at bedtime.  . diclofenac (VOLTAREN) 75 MG EC tablet Take 1 tablet (75 mg total) by mouth 2 (two) times daily.  . diclofenac sodium (VOLTAREN) 1 % GEL Apply 2 g topically 3 (three) times daily as needed  (join pain).   Marland Kitchen diltiazem (CARDIZEM CD) 360 MG 24 hr capsule Take 1 capsule (360 mg total) by mouth daily.  Marland Kitchen donepezil (ARICEPT) 10 MG tablet Take 1 tablet (10 mg total) by mouth at bedtime.  . folic acid (FOLVITE) 1 MG tablet Take 1 mg by mouth 2 (two) times daily.   . furosemide (LASIX) 20 MG tablet Take 1 tablet (20 mg total) by mouth daily.  . metFORMIN (GLUCOPHAGE) 500 MG tablet Take 1 tablet (  500 mg total) by mouth 2 (two) times daily with a meal.  . Multiple Vitamin (MULTIVITAMIN) capsule Take 1 capsule by mouth daily.  . pantoprazole (PROTONIX) 40 MG tablet Take 1 tablet (40 mg total) by mouth daily.  . propranolol (INDERAL) 10 MG tablet Take 10 mg by mouth 3 (three) times daily as needed.   . venlafaxine XR (EFFEXOR-XR) 75 MG 24 hr capsule Take 75 mg by mouth daily with breakfast.  . vitamin D, CHOLECALCIFEROL, 400 UNITS tablet Take 400 Units by mouth 2 (two) times daily.            Objective:   Physical Exam  Constitutional: She is oriented to person, place, and time. She appears well-developed and well-nourished.  HENT:  Head: Normocephalic and atraumatic.  Right Ear: External ear normal.  Left Ear: External ear normal.  Nose: Nose normal.  Mouth/Throat: Oropharynx is clear and moist.  TMs and canals are clear.   Eyes: Conjunctivae and EOM are normal. Pupils are equal, round, and reactive to light.  Neck: Neck supple. No thyromegaly present.  Cardiovascular: Normal rate, regular rhythm and normal heart sounds.   Pulmonary/Chest: Effort normal and breath sounds normal. She has no wheezes.  Abdominal: Soft. Bowel sounds are normal. She exhibits no distension and no mass. There is no tenderness. There is no rebound and no guarding.  Musculoskeletal: She exhibits no edema.  Lymphadenopathy:    She has no cervical adenopathy.  Neurological: She is alert and oriented to person, place, and time.  Skin: Skin is warm and dry.  Psychiatric: She has a normal mood and affect.           Assessment & Plan:  Preoperative clearance for right knee replacement-patient is cleared for surgery. She is overall relatively low risk. Her blood pressure is well-controlled.  She did have some recent blood work done with her rheumatologist. She had an abnormal white count but after going to the emergency department it was repeated and was normal.  EKG today shows rate of 64 beats per minute, normal sinus rhythm with normal axis and no acute ST-T wave changes.  Please see attached copy of EKG.   Hx of post op nausea and vomiting. Recommend aggressive prevention and treatment available by the anesthesiologist.   HTN - well controlled on current regime.     Rectal prolapse-recommend referral to general surgery. For now she can certainly reduce the prolapse manually as needed. Can see suppositories or even an enema to get the bowels moving it needs to. Can apply pressure to the perineum for extra support to the anterior wall.  Note: She recently had a white blood cell count that was elevated at Dr. Patrecia Pour office. Family her white blood cell count was around 70,000. They thought that she might have leukemia.Marland Kitchen She was sent to the emergency department. They rechecked a white blood cell count and it was normal. She was then contacted by the oncologist to encourage her to have one more repeat check to verify that her white blood cells were in the normal range. We'll repeat CBC with differential today.

## 2014-08-01 LAB — CBC WITH DIFFERENTIAL/PLATELET
Basophils Absolute: 0.1 10*3/uL (ref 0.0–0.1)
Basophils Relative: 1 % (ref 0–1)
Eosinophils Absolute: 0.1 10*3/uL (ref 0.0–0.7)
Eosinophils Relative: 1 % (ref 0–5)
HCT: 39.1 % (ref 36.0–46.0)
Hemoglobin: 13.4 g/dL (ref 12.0–15.0)
Lymphocytes Relative: 28 % (ref 12–46)
Lymphs Abs: 2.7 10*3/uL (ref 0.7–4.0)
MCH: 32 pg (ref 26.0–34.0)
MCHC: 34.3 g/dL (ref 30.0–36.0)
MCV: 93.3 fL (ref 78.0–100.0)
Monocytes Absolute: 1 10*3/uL (ref 0.1–1.0)
Monocytes Relative: 10 % (ref 3–12)
Neutro Abs: 5.7 10*3/uL (ref 1.7–7.7)
Neutrophils Relative %: 60 % (ref 43–77)
Platelets: 447 10*3/uL — ABNORMAL HIGH (ref 150–400)
RBC: 4.19 MIL/uL (ref 3.87–5.11)
RDW: 14.7 % (ref 11.5–15.5)
WBC: 9.5 10*3/uL (ref 4.0–10.5)

## 2014-08-06 ENCOUNTER — Telehealth: Payer: Self-pay | Admitting: Emergency Medicine

## 2014-08-06 NOTE — Telephone Encounter (Signed)
Called pt to see what problems or why she isn't wearing CPAP. She reports she was on vacation x 3 weeks in [place]. Since she has been back she reports she has been wearing it nightly. She also scheduled her F/U with RB 11/24 at 4:15 to f/u on CPAP.  Called Melissa and made her aware of above. Nothing further needed

## 2014-08-07 ENCOUNTER — Other Ambulatory Visit (INDEPENDENT_AMBULATORY_CARE_PROVIDER_SITE_OTHER): Payer: Self-pay | Admitting: General Surgery

## 2014-08-07 ENCOUNTER — Other Ambulatory Visit: Payer: Self-pay

## 2014-08-07 MED ORDER — DONEPEZIL HCL 10 MG PO TABS: 10.0000 mg | ORAL_TABLET | Freq: Every day | ORAL | Status: DC

## 2014-08-07 NOTE — H&P (Signed)
Mia [name] 08/07/2014 11:23 AM Location: [facility] Patient #: [id number] DOB: 02-17-1951 Married / Language: Cleophus Molt / Race: White Female History of Present Illness Leighton Ruff MD; 04/54/0981 1:06 PM) Patient words: eval rectal prolapse.  The patient is a 63 year old female who presents with a complaint of abnormal defecation. Is a 63 year old female who is status post 2 remote rectocele repairs. She feels that her symptoms are returning. She reports vaginal splinting with bowel movements. She reports worsening symptoms with soft stool. She has occasional fecal incontinence with loose stool. She has tried fiber supplements, stool softeners and rectal suppositories to help with this. The condition has worsened over the last year. Other Problems Juanita Laster, MA; 08/07/2014 11:23 AM) Arthritis Back Pain Bladder Problems Chest pain Cholelithiasis Diabetes Mellitus Gastric Ulcer Gastroesophageal Reflux Disease General anesthesia - complications Hemorrhoids High blood pressure Home Oxygen Use Lump In Breast Migraine Headache Other disease, cancer, significant illness Sleep Apnea Thyroid Disease Transfusion history  Past Surgical History Juanita Laster, MA; 08/07/2014 11:23 AM) Anal Fissure Repair Appendectomy Breast Biopsy Right. Breast Mass; Local Excision Right. Cataract Surgery Bilateral. Gallbladder Surgery - Open Hemorrhoidectomy Hysterectomy (not due to cancer) - Partial Spinal Surgery - Neck Tonsillectomy  Diagnostic Studies History Juanita Laster, MA; 08/07/2014 11:23 AM) Colonoscopy 5-10 years ago Mammogram within last year Pap Smear >5 years ago  Allergies Juanita Laster, MA; 08/07/2014 11:27 AM) Percocet *ANALGESICS - OPIOID* Nausea, Vomiting. CeleBREX *ANALGESICS - ANTI-INFLAMMATORY* Codeine Phosphate *ANALGESICS - OPIOID* Nausea, Vomiting. Erythromycin-Sulfisoxazole *ANTI-INFECTIVE AGENTS -  MISC.* Erythromycin Base *MACROLIDES* Nausea, Vomiting. Hydrocodone-Acetaminophen *ANALGESICS - OPIOID* Nausea, Vomiting. Nitrofurantoin *URINARY ANTI-INFECTIVES* Vomiting, Nausea. Lyrica *ANTICONVULSANTS* Swelling. Verapamil HCl *CALCIUM CHANNEL BLOCKERS* Nausea, Vomiting. Darvocet A500 *ANALGESICS - OPIOID* Nausea, Vomiting.  Medication History Delilah Shan [place], [place]; 08/07/2014 11:31 AM) Meperidine HCl (50MG  Tablet, Oral qd) Active. BD TB Syringe (27G X 1/2"1 ML Misc,) Active. Cyclobenzaprine HCl (10MG  Tablet, Oral prn) Active. Diclofenac Sodium (75MG  Tablet DR, Oral qd) Active. Diltiazem HCl ER Coated Beads (360MG  Capsule ER 24HR, Oral qd) Active. Donepezil HCl (10MG  Tablet, Oral qd) Active. Folic Acid (1MG  Tablet, Oral qd) Active. Furosemide (20MG  Tablet, Oral qd) Active. Methotrexate Sodium (25MG /ML Solution, Injection) Active. Pantoprazole Sodium (40MG  Tablet DR, Oral prn) Active. Venlafaxine HCl ER (75MG  Capsule ER 24HR, Oral qd) Active. Calcium Carbonate (1250MG  Tablet, Oral qd) Active. MetFORMIN HCl ER (500MG  Tablet ER 24HR, Oral qd) Active. Multivitamins (Oral qd) Active. Propranolol HCl (10MG  Tablet, Oral qd) Active. Vitamin D (1000UNIT Capsule, Oral qd) Active.  Social History Delilah Shan [place], [place]; 08/07/2014 11:23 AM) Alcohol use Remotely quit alcohol use. Caffeine use Carbonated beverages, Coffee. No drug use Tobacco use Never smoker.  Family History Delilah Shan [place], [place]; 08/07/2014 11:23 AM) Arthritis Daughter, Sister. Bleeding disorder Sister. Breast Cancer Family Members In General. Depression Sister. Diabetes Mellitus Family Members In General, Mother. Family history unknown First Degree Relatives Heart Disease Mother. Heart disease in female family member before age 25 Hypertension Sister. Kidney Disease Family Members In General. Malignant Neoplasm Of Pancreas Family Members In General. Migraine Headache  Sister.  Pregnancy / Birth History Delilah Shan [place], [place]; 08/07/2014 11:23 AM) Age at menarche 21 years. Age of menopause <45 Gravida 2 Maternal age 71-25 Para 2     Review of Systems Delilah Shan [place] MA; 08/07/2014 11:23 AM) General Present- Night Sweats. Not Present- Appetite Loss, Chills, Fatigue, Fever, Weight Gain and Weight Loss. Skin Present- Dryness and New Lesions. Not Present- Change in Wart/Mole, Hives, Jaundice, Non-Healing Wounds, Rash and Ulcer. HEENT Present- Seasonal  Allergies and Wears glasses/contact lenses. Not Present- Earache, Hearing Loss, Hoarseness, Nose Bleed, Oral Ulcers, Ringing in the Ears, Sinus Pain, Sore Throat, Visual Disturbances and Yellow Eyes. Respiratory Present- Difficulty Breathing. Not Present- Bloody sputum, Chronic Cough, Snoring and Wheezing. Breast Not Present- Breast Mass, Breast Pain, Nipple Discharge and Skin Changes. Cardiovascular Present- Rapid Heart Rate and Shortness of Breath. Not Present- Chest Pain, Difficulty Breathing Lying Down, Leg Cramps, Palpitations and Swelling of Extremities. Gastrointestinal Present- Change in Bowel Habits, Constipation, Hemorrhoids and Rectal Pain. Not Present- Abdominal Pain, Bloating, Bloody Stool, Chronic diarrhea, Difficulty Swallowing, Excessive gas, Gets full quickly at meals, Indigestion, Nausea and Vomiting. Female Genitourinary Present- Frequency, Nocturia and Urgency. Not Present- Painful Urination and Pelvic Pain. Musculoskeletal Present- Back Pain, Joint Pain, Joint Stiffness, Muscle Pain, Muscle Weakness and Swelling of Extremities. Neurological Present- Decreased Memory and Tremor. Not Present- Fainting, Headaches, Numbness, Seizures, Tingling, Trouble walking and Weakness. Psychiatric Not Present- Anxiety, Bipolar, Change in Sleep Pattern, Depression, Fearful and Frequent crying. Endocrine Present- Cold Intolerance and New Diabetes. Not Present- Excessive Hunger, Hair Changes, Heat  Intolerance and Hot flashes. Hematology Present- Easy Bruising. Not Present- Excessive bleeding, Gland problems, HIV and Persistent Infections.  Vitals Delilah Shan [place] MA; 08/07/2014 11:24 AM) 08/07/2014 11:24 AM Weight: 166 lb Height: 66in Body Surface Area: 1.87 m Body Mass Index: 26.79 kg/m Temp.: 97.55F(Oral)  Pulse: 80 (Regular)  Resp.: 22 (Unlabored)  BP: 132/92 (Sitting, Left Arm, Standard)     Physical Exam Leighton Ruff MD; 11/88/6773 11:44 AM)  General Mental Status-Alert. General Appearance-Cooperative.  Chest and Lung Exam Auscultation Breath sounds - Normal.  Cardiovascular Auscultation Heart Sounds - Normal heart sounds.  Abdomen Palpation/Percussion Palpation and Percussion of the abdomen reveal - Soft and Non Tender.    Assessment & Plan Leighton Ruff MD; 73/66/8159 1:09 PM)  RECTOCELE, FEMALE (618.04  N81.6) Story: This is a 63 year old female who is status post surgical repair of a rectocele remotely. This was done by a gynecologist hearing [place]. She reports similar symptoms over the last couple years. Her symptoms are most consistent with rectocele. Impression: On exam the patient has a moderate rectocele. I have recommended surgical repair. We discussed the risk and benefits of the surgery which included bleeding, infection, ongoing pain and recurrence. we discussed rectal and vaginal approaches. Given that her previous surgeries appear to have been transvaginal, I have recommended a transrectal repair. She is scheduled for knee surgery in the next couple weeks. We will plan on doing her rectocele repair after she has healed from this.  Current Plans Pt Education - [facility] Rectocele (AT) ANOSCOPY, DIAGNOSTIC (47076)

## 2014-08-13 ENCOUNTER — Ambulatory Visit: Payer: Managed Care, Other (non HMO) | Admitting: Nurse Practitioner

## 2014-08-13 DIAGNOSIS — M1711 Unilateral primary osteoarthritis, right knee: Secondary | ICD-10-CM | POA: Insufficient documentation

## 2014-08-14 ENCOUNTER — Ambulatory Visit: Payer: Managed Care, Other (non HMO) | Admitting: Nurse Practitioner

## 2014-08-15 ENCOUNTER — Ambulatory Visit: Payer: Self-pay | Admitting: Adult Health

## 2014-08-16 ENCOUNTER — Ambulatory Visit (INDEPENDENT_AMBULATORY_CARE_PROVIDER_SITE_OTHER): Payer: Medicare HMO | Admitting: Cardiovascular Disease

## 2014-08-16 ENCOUNTER — Encounter: Payer: Self-pay | Admitting: Cardiovascular Disease

## 2014-08-16 VITALS — BP 136/80 | HR 80 | Wt 167.0 lb

## 2014-08-16 DIAGNOSIS — I471 Supraventricular tachycardia: Secondary | ICD-10-CM

## 2014-08-16 NOTE — Patient Instructions (Signed)
Your physician recommends that you continue on your current medications as directed. Please refer to the Current Medication list given to you today.  Your physician wants you to follow-up in: 6 months with Dr. Nahser.  You will receive a reminder letter in the mail two months in advance. If you don't receive a letter, please call our office to schedule the follow-up appointment.  

## 2014-08-16 NOTE — Assessment & Plan Note (Signed)
Mia [name] is doing well. She has rare episodes of SVT.  She had a HR of 159 ( according to her HR monitor). She took a propranolol with resolution  Her BP is well controlled.  She needs to have right knee replacement.  She is at low risk for CV complications with her knee replacement

## 2014-08-16 NOTE — Progress Notes (Signed)
Mia [name] Date of Birth  1950/12/28 [facility] / [facility] [address]     [place], [place]  [address] [phone]  Fax  [phone]   Problems 1. SVT 2. Raynaud's Phenomenon 3. Diabetes Mellitus 4. Minimal CAD by cath 2006 [name]) 5. Sleep apnea - CPAP is currently not working  02/11/05 Right heart catheterization:  RA pressure: 8 mmHg  RV pressure: 25/7 mmHg  PA pressure: 20/9 mmHg  PWCP: 9 mmHg  CO: 4.6 liters per minute  LEFT HEART CATHETERIZATION RESULTS:  1. Left main: No significant disease.  2. LAD: Mild luminal irregularities.  3. First and second diagonal: Moderate size, mild luminal irregularities.  4. Left circumflex: Nondominant, mild luminal irregularities.  5. RCA: Dominant with mild luminal irregularities.  6. LV: EF is 55 to 60%, no wall motion abnormalities. LV EDP was 9 mmHg.   History of Present Illness:  Pt has a history of SVT, sleep apnea, normal coronaries, Raynaud's phenomen.   Exercising some.  Limited by ruptured disc in her neck.  She had a falling accident and injured her right shoulder.  She has had a few  episodes of SVT since I last saw her.  She has been walking and has some dyspnea when walking up hills.  January 24, 2013:  Mia [name] complains of a fast HR recently.  She has continued to have some dyspnea.    She was vacationing in [place] - she developed strep throat.  .  She has not been able to lie flat while sleeping recently because of    2 pillow orthopnea .  She also has severe DOE with sweeping the kitchen.   Complains of weight in lower chest .  She has severe dyspnea getting out of the shower.   She had right rotator cuff repair and biceps repair several months ago.    January 15, 2014:  Mia [name] has continued to have some episodes of tachycardia - HR of 198 at times.  These occur with rest and with walking on the treadmill.    These are associated with fairly significant dyspnea.   She  took a propranolol which seems to have helped.  The worse episode lasted several hours.  Denied any cp.    Oct. 22, 2015:  Mia [name] is doing well.  She is going to have her right knee replaced soon. BP has been well controlled.  Better on the higher dose of Diltiazem.   She  Still has occasional episodes of dyspnea.  Echo and myoview in 2014 were WNL.    Current Outpatient Prescriptions  Medication Sig Dispense Refill  . AMBULATORY NON FORMULARY MEDICATION Medication Name: One Touch Ultra extra fine 33 gauge. Check fasting blood sugar every morning and as needed. Dx of Impaired Fasting Glucose Fax to [phone]  50 strip  11  . B-D TB SYRINGE 1CC/27GX1/2" 27G X 1/2" 1 ML MISC Inject 1 each into the muscle once a week.      . calcium carbonate (OS-CAL) 600 MG TABS Take 600 mg by mouth daily with breakfast.       . cetirizine (ZYRTEC) 10 MG tablet Take 10 mg by mouth as needed for allergies.      . cyclobenzaprine (FLEXERIL) 10 MG tablet Take 1 tablet (10 mg total) by mouth at bedtime.  45 tablet  1  . diclofenac (VOLTAREN) 75 MG EC tablet Take 1 tablet (75 mg total) by mouth 2 (two) times daily.  [place]  tablet  1  . diclofenac sodium (VOLTAREN) 1 % GEL Apply 2 g topically 3 (three) times daily as needed (join pain).       Marland Kitchen diltiazem (CARDIZEM CD) 360 MG 24 hr capsule Take 1 capsule (360 mg total) by mouth daily.  90 capsule  3  . donepezil (ARICEPT) 10 MG tablet Take 1 tablet (10 mg total) by mouth at bedtime.  30 tablet  0  . fluticasone (FLONASE) 50 MCG/ACT nasal spray Place into both nostrils as needed for allergies or rhinitis.      . folic acid (FOLVITE) 1 MG tablet Take 1 mg by mouth 2 (two) times daily.       . furosemide (LASIX) 20 MG tablet Take 1 tablet (20 mg total) by mouth daily.  90 tablet  1  . meperidine (DEMEROL) 50 MG tablet Take 50 mg by mouth as needed for severe pain.      . metFORMIN (GLUCOPHAGE) 500 MG tablet Take 1 tablet (500 mg total) by mouth 2 (two) times daily  with a meal.  60 tablet  11  . Multiple Vitamin (MULTIVITAMIN) capsule Take 1 capsule by mouth daily.      . NON FORMULARY Place into the nose at bedtime. cpap machine      . pantoprazole (PROTONIX) 40 MG tablet Take 1 tablet (40 mg total) by mouth daily.  90 tablet  1  . propranolol (INDERAL) 10 MG tablet Take 10 mg by mouth 3 (three) times daily as needed.       . SUMAtriptan (IMITREX) 25 MG tablet Take 25 mg by mouth as needed for migraine or headache. May repeat in 2 hours if headache persists or recurs.      . venlafaxine XR (EFFEXOR-XR) 75 MG 24 hr capsule Take 75 mg by mouth daily with breakfast.      . vitamin D, CHOLECALCIFEROL, 400 UNITS tablet Take 400 Units by mouth daily.        No current facility-administered medications for this visit.     Allergies  Allergen Reactions  . Percocet [Oxycodone-Acetaminophen] Nausea And Vomiting  . Celecoxib     Other reaction(s): Other flushed  . Codeine Nausea And Vomiting  . Erythromycin Nausea And Vomiting  . Hydrocodone Nausea And Vomiting  . Ketorolac Tromethamine Nausea And Vomiting  . Nitrofurantoin Nausea And Vomiting  . Oxycodone-Acetaminophen Nausea And Vomiting  . Pregabalin Swelling    legs  . Tramadol Nausea And Vomiting  . Verapamil Nausea And Vomiting    REACTION: intolerance  . Darvocet [Propoxyphene N-Acetaminophen] Nausea And Vomiting    Past Medical History  Diagnosis Date  . SVT (supraventricular tachycardia)   . Sleep apnea   . Raynaud phenomenon   . Edema   . Anemia   . Arthritis   . Blood transfusion   . Diabetes mellitus   . GERD (gastroesophageal reflux disease)     gastritis  . Hypertension   . Neuromuscular disorder     sclerosis  . Neuromuscular disorder     fibromyalgia  . Pseudogout   . Thyroid disease     hypothyroidism  . Hemorrhoids   . Rectal bleeding   . PONV (postoperative nausea and vomiting)   . Memory difficulty 12/14/2013  . Fibromyalgia     Past Surgical History   Procedure Laterality Date  . Cholecystectomy    . Appendectomy    . Tonsillectomy    . Hysterectomy - unknown type    . Knee surgery  x6  . Oophorectomy    . Dilation and curettage of uterus    . Breast biopsy    . Shoulder surgery      bilateral- bones spur  . Neck surgery      fusion  . Quadriceps repair      right  . Bladder surgery      x2  . Cataract extraction      bilatera  . Rectocele repair      x2  . Enterocele repair      x2  . Cardiac catheterization    . Eye surgery      retina  . Shoulder arthroscopy with subacromial decompression, rotator cuff repair and bicep tendon repair  10/06/2012    Procedure: SHOULDER ARTHROSCOPY WITH SUBACROMIAL DECOMPRESSION, ROTATOR CUFF REPAIR AND BICEP TENDON REPAIR;  Surgeon: Nita Sells, MD;  Location: [facility];  Service: Orthopedics;  Laterality: Right;  Arthroscopic  Repair  of  Subscapularis, Open Biceps Tenodesis  . Bladder surgery      History  Smoking status  . Never Smoker   Smokeless tobacco  . Never Used    History  Alcohol Use No    Family History  Problem Relation Age of Onset  . Heart attack    . Stroke    . Diabetes    . Cancer Maternal Grandmother     breast  . Cancer Maternal Grandfather     pancreatic  . Heart disease Mother   . Heart disease Father   . Pulmonary fibrosis Sister   . Stroke Sister   . Lupus Sister   . Asthma Sister     Reviw of Systems:  Reviewed in the HPI.  All other systems are negative.  Physical Exam: BP 136/80  Pulse 80  Wt 167 lb (75.751 kg) The patient is alert and oriented x 3.  The mood and affect are normal.  The skin is warm and dry.  Color is normal.  The HEENT exam reveals that the sclera are nonicteric.  The mucous membranes are moist.  The carotids are 2+ without bruits.  There is no thyromegaly.  There is no JVD.  The lungs are clear.  The chest wall is non tender.  The heart exam reveals a regular rate with a normal S1  and S2.  There are no murmurs, gallops, or rubs.  The PMI is not displaced.   Abdominal exam reveals good bowel sounds.  There is no guarding or rebound.  There is no hepatosplenomegaly or tenderness.  There are no masses.  Exam of the legs reveal no clubbing, cyanosis, or edema.  The legs are without rashes.  The distal pulses are intact.  Cranial nerves II - XII are intact.  Motor and sensory functions are intact.  The gait is normal.  ECG: 08/16/2014: Normal sinus rhythm at 72. EKG is normal.    Assessment / Plan:

## 2014-08-21 ENCOUNTER — Encounter: Payer: Self-pay | Admitting: Family Medicine

## 2014-08-30 ENCOUNTER — Encounter: Payer: Self-pay | Admitting: Family Medicine

## 2014-09-03 MED ORDER — VITAMIN D2 1,250 MCG (50,000 UNIT) CAPSULE
1250 mcg (50,000 unit) | ORAL_CAPSULE | ORAL | Status: DC
Start: 2014-09-03 — End: 2015-09-02

## 2014-09-07 ENCOUNTER — Encounter: Payer: Self-pay | Admitting: Nurse Practitioner

## 2014-09-07 ENCOUNTER — Ambulatory Visit (INDEPENDENT_AMBULATORY_CARE_PROVIDER_SITE_OTHER): Payer: Medicare HMO | Admitting: Nurse Practitioner

## 2014-09-07 ENCOUNTER — Telehealth: Payer: Self-pay | Admitting: Cardiovascular Disease

## 2014-09-07 VITALS — BP 124/72 | HR 87 | Ht 66.0 in | Wt 164.0 lb

## 2014-09-07 DIAGNOSIS — I471 Supraventricular tachycardia, unspecified: Secondary | ICD-10-CM

## 2014-09-07 DIAGNOSIS — R0600 Dyspnea, unspecified: Secondary | ICD-10-CM | POA: Insufficient documentation

## 2014-09-07 DIAGNOSIS — R0609 Other forms of dyspnea: Secondary | ICD-10-CM

## 2014-09-07 DIAGNOSIS — I5032 Chronic diastolic (congestive) heart failure: Secondary | ICD-10-CM

## 2014-09-07 DIAGNOSIS — I10 Essential (primary) hypertension: Secondary | ICD-10-CM

## 2014-09-07 NOTE — Telephone Encounter (Signed)
New message  Mia [name] with [facility] called reports the Pt recently had a knee replacement on 10/37.. As of 11/12 the patient has had Higher heart rate and BP. Heart rate at rest 96/110. Walked for 4 min it went up to 128. Pt has SOB with talking. Bp 142/90.   Please call back to discuss if  The pt should take propranolol.

## 2014-09-07 NOTE — Telephone Encounter (Signed)
Called and spoke with patient after reading note and talking over the symptoms with Dr. Acie Fredrickson.  Patient states she is glad Mia [name] called because she has been having a difficult time taking a deep breath.  Patient had knee replacement on 10/27.   I advised patient that with her symptoms and recent surgery that Dr. Acie Fredrickson has advised she come to the office for evaluation.  Patient is scheduled to see Ignacia Bayley, NP at 2:30 pm.  Patient verbalized understanding and agreement with plan of care.

## 2014-09-07 NOTE — Assessment & Plan Note (Addendum)
Pt was seen with Mia Bayley, NP I agree with the above note and evaluation.  She has had some slight  worsening of her dyspnea  Her exam is unremarkable - I dont think she is necessarily volume overloaded. She has been on Lovenox and so I don't think she has a DVT / PE.  I think that she is actually just deconditioned from her knee surgery.  Her sats remained normal while walking   I will see her at her regularly scheduled apt. In 6 months  - sooner if needed.    Thayer Headings, Brooke Bonito., MD, [facility] 09/07/2014, 5:44 PM [address]  [place] Pager [phone]

## 2014-09-07 NOTE — Progress Notes (Signed)
Patient Name: Mia [name] Date of Encounter: 09/07/2014  Primary Care Provider:  [name], MD Primary Cardiologist:  Joaquim Nam, MD   Patient Profile  63 year old female with a history of SVT and chronic dyspnea on exertion who presents secondary to dyspnea on exertion.  Problem List   Past Medical History  Diagnosis Date  . SVT (supraventricular tachycardia)   . Sleep apnea   . Raynaud phenomenon   . Edema   . Anemia   . Arthritis   . Blood transfusion   . Diabetes mellitus   . GERD (gastroesophageal reflux disease)     gastritis  . Hypertension   . Neuromuscular disorder     sclerosis  . Neuromuscular disorder     fibromyalgia  . Pseudogout   . Thyroid disease     hypothyroidism  . Hemorrhoids   . Rectal bleeding   . PONV (postoperative nausea and vomiting)   . Memory difficulty 12/14/2013  . Fibromyalgia   . DOE (dyspnea on exertion)     a. Chronic;  b. 01/2013 Echo: EF 60-65%, Gr 1 DD;  b. 01/2013 MV: EF 59%, no ischemia.   Past Surgical History  Procedure Laterality Date  . Cholecystectomy    . Appendectomy    . Tonsillectomy    . Hysterectomy - unknown type    . Knee surgery      x6  . Oophorectomy    . Dilation and curettage of uterus    . Breast biopsy    . Shoulder surgery      bilateral- bones spur  . Neck surgery      fusion  . Quadriceps repair      right  . Bladder surgery      x2  . Cataract extraction      bilatera  . Rectocele repair      x2  . Enterocele repair      x2  . Cardiac catheterization    . Eye surgery      retina  . Shoulder arthroscopy with subacromial decompression, rotator cuff repair and bicep tendon repair  10/06/2012    Procedure: SHOULDER ARTHROSCOPY WITH SUBACROMIAL DECOMPRESSION, ROTATOR CUFF REPAIR AND BICEP TENDON REPAIR;  Surgeon: Nita Sells, MD;  Location: [facility];  Service: Orthopedics;  Laterality: Right;  Arthroscopic  Repair  of  Subscapularis, Open Biceps  Tenodesis  . Bladder surgery      Allergies  Allergies  Allergen Reactions  . Percocet [Oxycodone-Acetaminophen] Nausea And Vomiting  . Celecoxib Other (See Comments)    Other reaction(s): Other flushed  . Codeine Nausea And Vomiting  . Darvocet [Propoxyphene N-Acetaminophen] Nausea And Vomiting  . Erythromycin Nausea And Vomiting    Nausea and vomiting   . Hydrocodone Nausea And Vomiting  . Ketorolac Tromethamine Nausea And Vomiting  . Nitrofurantoin Nausea And Vomiting  . Oxycodone-Acetaminophen Nausea And Vomiting  . Oxycodone-Acetaminophen Nausea And Vomiting    Nausea and vomiting   . Pregabalin Swelling    Legs swelling   . Tramadol Nausea And Vomiting  . Verapamil Nausea And Vomiting    REACTION: intolerance    HPI  63 year old female with a prior history of supraventricular tachycardia which has been managed with oral calcium channel blocker therapy and when necessary beta blocker. She also has a history of chronic dyspnea on exertion and elevated heart rates with exertion. In late October, she underwent right total knee arthroplasty and also repair of her right quadriceps. While hospitalized, she  was noted to have significant right calf swelling and tenderness. An ultrasound was performed and was negative for DVT. She has been on anticoagulation with Lovenox since discharge from the hospital. She's had no further right calf swelling but has continued to have some tenderness. She has been using a walker to ambulate and has been working with physical therapy. She has noted some increase in dyspnea on exertion above baseline and was documented yesterday by physical therapy to have some elevation of heart rates with activity into the 120s. She was advised to see her cardiologist by the physical therapist. Patient says that from her perspective, overall she is doing well. She is not having any chest pain, dyspnea at rest, PND, orthopnea, dizziness, syncopal, edema, early  satiety.  Home Medications  Prior to Admission medications   Medication Sig Start Date End Date Taking? Authorizing Provider  AMBULATORY NON FORMULARY MEDICATION Medication Name: One Touch Ultra extra fine 33 gauge. Check fasting blood sugar every morning and as needed. Dx of Impaired Fasting Glucose Fax to [phone] 03/27/14  Yes Hali Marry, MD  aspirin 325 MG tablet Take 325 mg by mouth daily.   Yes Historical Provider, MD  B-D TB SYRINGE 1CC/27GX1/2" 27G X 1/2" 1 ML MISC Inject 1 each into the muscle once a week. 11/13/13  Yes Historical Provider, MD  calcium carbonate (OS-CAL) 600 MG TABS Take 600 mg by mouth daily with breakfast.    Yes Historical Provider, MD  cetirizine (ZYRTEC) 10 MG tablet Take 10 mg by mouth as needed for allergies.   Yes Historical Provider, MD  cyclobenzaprine (FLEXERIL) 10 MG tablet Take 1 tablet (10 mg total) by mouth at bedtime. 06/13/14  Yes Jade L Breeback, PA-C  diclofenac (VOLTAREN) 75 MG EC tablet Take 1 tablet (75 mg total) by mouth 2 (two) times daily. 07/31/14  Yes Hali Marry, MD  diclofenac sodium (VOLTAREN) 1 % GEL Apply 2 g topically 3 (three) times daily as needed (join pain).    Yes Historical Provider, MD  diltiazem (CARDIZEM CD) 360 MG 24 hr capsule Take 1 capsule (360 mg total) by mouth daily. 01/15/14  Yes Thayer Headings, MD  donepezil (ARICEPT) 10 MG tablet Take 1 tablet (10 mg total) by mouth at bedtime. 08/07/14  Yes Kathrynn Ducking, MD  fluticasone [facility]) 50 MCG/ACT nasal spray Place into both nostrils as needed for allergies or rhinitis.   Yes Historical Provider, MD  folic acid (FOLVITE) 1 MG tablet Take 1 mg by mouth 2 (two) times daily.  07/29/12  Yes Historical Provider, MD  furosemide (LASIX) 20 MG tablet Take 1 tablet (20 mg total) by mouth daily. 03/09/14  Yes Hali Marry, MD  meperidine (DEMEROL) 50 MG tablet Take 50 mg by mouth as needed for severe pain.   Yes Historical Provider, MD  metFORMIN  (GLUCOPHAGE) 500 MG tablet Take 1 tablet (500 mg total) by mouth 2 (two) times daily with a meal. 03/13/14  Yes Hali Marry, MD  morphine (MSIR) 15 MG tablet Take 15 mg by mouth every 4 (four) hours as needed for severe pain (1-2 tabs as needed).   Yes Historical Provider, MD  Multiple Vitamin (MULTIVITAMIN) capsule Take 1 capsule by mouth daily.   Yes Historical Provider, MD  NON FORMULARY Place into the nose at bedtime. cpap machine   Yes Historical Provider, MD  pantoprazole (PROTONIX) 40 MG tablet Take 1 tablet (40 mg total) by mouth daily. 03/09/14  Yes Hali Marry,  MD  propranolol (INDERAL) 10 MG tablet Take 10 mg by mouth 3 (three) times daily as needed.    Yes Historical Provider, MD  SUMAtriptan (IMITREX) 25 MG tablet Take 25 mg by mouth as needed for migraine or headache. May repeat in 2 hours if headache persists or recurs.   Yes Historical Provider, MD  venlafaxine XR (EFFEXOR-XR) 75 MG 24 hr capsule Take 75 mg by mouth daily with breakfast.   Yes Historical Provider, MD  vitamin D, CHOLECALCIFEROL, 400 UNITS tablet Take 400 Units by mouth daily.    Yes Historical Provider, MD    Review of Systems  She is experiencing dyspnea on exertion slightly above her baseline degree of dyspnea on exertion. She continues of right calf tenderness but no swelling.  All other systems reviewed and are otherwise negative except as noted above.  Physical Exam  Blood pressure 124/72, pulse 87, height 5\' 6"  (1.676 m), weight 164 lb (74.39 kg).  Heart rate rose to 117 with ambulation. Oxygen saturation was maintained at 97% on room air. General: Pleasant, NAD Psych: Normal affect. Neuro: Alert and oriented X 3. Moves all extremities spontaneously. HEENT: Normal  Neck: Supple without bruits or JVD. Lungs:  Resp regular and unlabored, CTA. Heart: RRR no s3, s4, or murmurs. Abdomen: Soft, non-tender, non-distended, BS + x 4.  Extremities: No clubbing, cyanosis or edema. The right calf  is tender to touch without swelling.  DP/PT/Radials 2+ and equal bilaterally.  Accessory Clinical Findings  ECG - regular sinus rhythm, 87, no acute ST or T changes.  Assessment & Plan  1.  Dyspnea on exertion: Patient has some degree of chronic dyspnea on exertion, noting dyspnea with activity such as sweeping around her home going back several years. She also has a history of elevated heart rates with minimal activity and this was also documented on stress testing in 2014. Since her surgery, she has had some increase in her dyspnea on exertion though she believes she is deconditioned. Though she has had swelling of her right calf and also tenderness, the swelling has since resolved and she had an ultrasound while in the hospital which was negative for DVT. She has since been maintained on Lovenox therapy was making DVT and/or PE very unlikely. She was able to ambulate without dyspnea here in the office, with her heart rate rising to 117 while maintaining her oxygen saturation at 97%.  I reviewed her case with Dr. Acie Fredrickson who has also seen her and independently evaluated her today.  I suspect that she is deconditioned in the setting of recent surgery and that dyspnea will improve with further physical therapy and activity.  She does not require any additional evaluation at this time.  2. SVT: Patient has a history of SVT. This is usually associated with marked dyspnea. She has not had anything like this since her hospitalization. She remains on calcium channel blocker therapy and we went over guidelines for using when necessary propranolol.  3. Disposition: She'll follow up with Dr. Acie Fredrickson as scheduled in approximately 6 months.    Murray Hodgkins, NP 09/07/2014, 5:01 PM

## 2014-09-07 NOTE — Patient Instructions (Signed)
Your physician recommends that you continue on your current medications as directed. Please refer to the Current Medication list given to you today.    KEEP FOLLOW UP APPOINTMENT WITH DR Shelbie Ammons  IN 6 MONTHS

## 2014-09-12 ENCOUNTER — Ambulatory Visit: Payer: Managed Care, Other (non HMO) | Admitting: Adult Health

## 2014-09-14 ENCOUNTER — Other Ambulatory Visit: Payer: Self-pay

## 2014-09-14 MED ORDER — DONEPEZIL HCL 10 MG PO TABS: 10.0000 mg | ORAL_TABLET | Freq: Every day | ORAL | Status: DC

## 2014-09-18 ENCOUNTER — Ambulatory Visit (INDEPENDENT_AMBULATORY_CARE_PROVIDER_SITE_OTHER): Payer: Medicare HMO | Admitting: Emergency Medicine

## 2014-09-18 ENCOUNTER — Encounter: Payer: Self-pay | Admitting: Emergency Medicine

## 2014-09-18 VITALS — BP 120/70 | HR 72 | Ht 66.0 in | Wt 173.0 lb

## 2014-09-18 DIAGNOSIS — G4733 Obstructive sleep apnea (adult) (pediatric): Secondary | ICD-10-CM

## 2014-09-18 NOTE — Patient Instructions (Signed)
Please continue your CPAP as you have been using it Continue your rehab work for your knee.  Follow with Dr Lamonte Sakai in 12 months or sooner if you have any problems

## 2014-09-18 NOTE — Assessment & Plan Note (Signed)
Good compliance and she feels that she has benefited. She likes the nasal mask. Her download shows good compliance. Will continue same

## 2014-09-18 NOTE — Progress Notes (Signed)
History of Present Illness:  63 yo woman never smoker with hx paroxismal SVT, Holter showed correlation at the time with exertional dyspnea and lightheadedness. PFT, methacholine and CPEX were reassuring except exercise limited by tachycardia. Has undergone CPAP titration and needs 14cm H2O pressure   ROV 07/30/09 -- Not using nasal pillows, using the nasal mask. Has been switched to auto-titration device due to difficulty tolerating 14cm H2O. Has had better compliance with latest mask. Wears it every night, When she wakes up in the middle of the night she takes it off. Can't really tell any difference in how she feels during the day - does have less fibromyalgia pain since she's been using it.   January 10, 2010--Presents for acute office visit. Complains of occ pain in left upper back wrapping around to the left breast when she takes a deep breath x8weeks. Happens intermittently, sore to touch, pain is under shoulder blade w/ radiation around to rbs. Use muscle relaxer patch did not help, no other meds used. Denies chest pain, dyspnea, orthopnea, hemoptysis, fever, n/v/d, edema, headache,recent travel or antibiotics.   ROV 08/25/10 -- Hx OSA and HTN. Was started on diltiazem about 6 weeks ago. Began to develop B LE edema, L>R, about 2 weeks ago. US showed no DVT, CT scan chest done, no PE showed "? PNA" so treated with levaquin. She had absolutely no symptoms at that time. She has been having some exertional dyspnea, occasionally feels her tachycardia, not sure the two correlate. Tells me she hasn't worn her CPAP in about 4 months because her mask is hurting her face.   ROV 09/04/10 -- Returns to f/u her edema, exertional SOB. I performed 24h urine, TTE last time - both are reassuring. She has not been wearing her CPAP mask reliably, needs a new mask.   ROV 02/08/13 -- return visit with hx OSA (on CPAP), HTN, SVT, exertional dyspnea s/p extensive w/u (negative methacholine, reassuring CPST. She was seen  by Dr Acie Fredrickson 01/24/13 for similar sx, tachycardia. Underwent TTE and stress testing >> normal. Her HR and BP were high. She has been off of CPAP for two years. She started back 2 weeks ago. Just got a new full face mask > having leakage problem. [employer] is the company. She says she is on auto-set, not 14cm H2O.   ROV 06/25/14 -- follows up for chronic dyspnea (w/u as above), OSA. She has been having trouble with R knee pain, R great toe pain. She has has hx pseudogout. She has been wearing her CPAP very rarely. She has the most difficulty trying to go to sleep, often gets waked up by a leak. She had spirometry 06/20/14 >> normal AF's, ? Small airways responsiveness. Her methacholine has previously been negative.    ROV 09/18/14 -- follow up visit for OSA and dyspnea with reassuring spirometry. 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 She has gotten a new CPAP and new mask > has been wearing reliably. She was seen in cardiology 11/13 for continued dyspnea, was noted to have exertional tachycardia. He is planning to repeat a stress test in the future. She tells me that she has been wearing her C Pap reliably averaging about 6 hours a night. She feels that it is helped her and is having less daytime sleepiness. A recent download from her device from 10/24-11/22/15 showed that she used her device for greater than 4 hours 77% of the time.    Filed Vitals:   09/18/14 1632  BP: 120/70  Pulse: 72  Height: 5\' 6"  (1.676 m)  Weight: 173 lb (78.472 kg)  SpO2: 100%   Gen: Pleasant, well-nourished, in no distress,  normal affect  ENT: No lesions,  mouth clear,  oropharynx clear, no postnasal drip  Neck: No JVD, no TMG, no carotid bruits  Lungs: No use of accessory muscles, no dullness to percussion, clear without rales or rhonchi  Cardiovascular: RRR, heart sounds normal, no murmur or gallops, no peripheral edema  Musculoskeletal: No deformities, no cyanosis or clubbing  Neuro: alert, non focal  Skin:  Warm, no lesions or rashes    Cardiac Stress Test 02/01/13: Impression  Exercise Capacity: Lexiscan with low level exercise.  BP Response: Normal blood pressure response.  Clinical Symptoms: There is dyspnea.  ECG Impression: No significant ST segment change suggestive of ischemia.  Comparison with Prior Nuclear Study: No significant change from previous study  Overall Impression: Normal stress nuclear study. No evidence of ischemia. Heart rate accelerated very rapidly on walking lexiscan protocol.  LV Ejection Fraction: 59%. LV Wall Motion: NL LV Function; NL Wall Motion  OSA (obstructive sleep apnea) Good compliance and she feels that she has benefited. She likes the nasal mask. Her download shows good compliance. Will continue same

## 2014-10-08 ENCOUNTER — Ambulatory Visit (INDEPENDENT_AMBULATORY_CARE_PROVIDER_SITE_OTHER): Payer: Medicare HMO | Admitting: Internal Medicine

## 2014-10-08 ENCOUNTER — Encounter: Payer: Self-pay | Admitting: Internal Medicine

## 2014-10-08 VITALS — BP 118/78 | HR 92 | Ht 65.5 in | Wt 167.5 lb

## 2014-10-08 DIAGNOSIS — N816 Rectocele: Secondary | ICD-10-CM

## 2014-10-08 DIAGNOSIS — R110 Nausea: Secondary | ICD-10-CM

## 2014-10-08 DIAGNOSIS — Z1211 Encounter for screening for malignant neoplasm of colon: Secondary | ICD-10-CM

## 2014-10-08 DIAGNOSIS — K219 Gastro-esophageal reflux disease without esophagitis: Secondary | ICD-10-CM

## 2014-10-08 DIAGNOSIS — K5901 Slow transit constipation: Secondary | ICD-10-CM

## 2014-10-08 MED ORDER — PANTOPRAZOLE SODIUM 40 MG PO TBEC: 40.0000 mg | DELAYED_RELEASE_TABLET | Freq: Two times a day (BID) | ORAL | Status: DC

## 2014-10-08 NOTE — Progress Notes (Signed)
HISTORY OF PRESENT ILLNESS:  Mia [name] is a 63 y.o. female who presents today to establish as a new GI patient. She has multiple medical problems as listed below. The reason for GI evaluation today is the need for repeat screening colonoscopy, nausea, and constipation. The patient underwent screening colonoscopy with Dr. Richmond Campbell in April 2010. The examination was complete with excellent preparation. Findings included mild sigmoid diverticulosis and internal hemorrhoids. No polyps. Repeat colonoscopy recommended in "5-10 years". She received a recall letter earlier this year stating that she was due for colonoscopy. However, Dr. Liliane Channel office does not except Medicare. She was referred here. The patient denies a family history of colon cancer. She denies change in bowel habits, lower abdominal pain, or rectal bleeding area review of blood work from October 2015 shows normal hemoglobin with normal MCV. Next, patient reports problems with nausea. She has had this intermittently for years. She is on multiple medications. Recently needing pain medication after recent knee surgery in October. No vomiting. Also noticing some breakthrough pyrosis despite once daily pantoprazole. No dysphagia. Patient does have a reported history of chronic abdominal pain. Upper endoscopy with duodenal biopsies in 2008 was normal. She has undergone abdominal ultrasound and abdominal CT scan postcholecystectomy without significant abnormalities found. Apparently has had ERCP with sphincterotomy for sphincter of OD dysfunction (at [facility]). Finally, she has chronic constipation. She takes stool softeners. She was diagnosed with a rectocele for which she is to have  surgery (with Dr. Marcello Moores) the last day of this month.  REVIEW OF SYSTEMS:  All non-GI ROS negative except for sinus and allergy trouble, arthritis, back pain, visual change, irregular heart rate, muscle cramps, night sweats, shortness of breath, sore  throat, increased thirst, excessive urination, urinary frequency, urinary leakage  Past Medical History  Diagnosis Date  . SVT (supraventricular tachycardia)   . Sleep apnea   . Raynaud phenomenon   . Edema   . Anemia   . Arthritis   . Blood transfusion   . Diabetes mellitus   . GERD (gastroesophageal reflux disease)     gastritis  . Hypertension   . Neuromuscular disorder     sclerosis  . Pseudogout   . Thyroid disease     hypothyroidism  . Hemorrhoids   . Rectal bleeding   . PONV (postoperative nausea and vomiting)   . Memory difficulty 12/14/2013  . Fibromyalgia   . Anal fissure   . Gall stones   . Interstitial cystitis     Past Surgical History  Procedure Laterality Date  . Cholecystectomy    . Appendectomy    . Tonsillectomy    . Hysterectomy - unknown type    . Knee surgery      x6  . Oophorectomy    . Dilation and curettage of uterus    . Breast biopsy    . Shoulder surgery      bilateral- bones spur  . Neck surgery      fusion  . Quadriceps repair Right   . Bladder surgery      x2  . Cataract extraction Bilateral   . Rectocele repair      x2  . Enterocele repair      x2  . Cardiac catheterization    . Eye surgery      retina  . Shoulder arthroscopy with subacromial decompression, rotator cuff repair and bicep tendon repair  10/06/2012    Procedure: SHOULDER ARTHROSCOPY WITH SUBACROMIAL DECOMPRESSION, ROTATOR CUFF REPAIR AND  BICEP TENDON REPAIR;  Surgeon: Nita Sells, MD;  Location: [facility];  Service: Orthopedics;  Laterality: Right;  Arthroscopic  Repair  of  Subscapularis, Open Biceps Tenodesis  . Bladder surgery      Social History Mia [name]  reports that she has never smoked. She has never used smokeless tobacco. She reports that she does not drink alcohol or use illicit drugs.  family history includes Asthma in her sister; Breast cancer in her maternal grandmother; Colon polyps in her mother and sister;  Diabetes in her mother; Heart attack in her father and mother; Heart disease in her father and mother; Irritable bowel syndrome in her sister; Lupus in her sister; Motor neuron disease in her sister; Pancreatic cancer in her maternal grandfather; Stroke in her mother and sister; Wilson's disease in her maternal grandmother.  Allergies  Allergen Reactions  . Percocet [Oxycodone-Acetaminophen] Nausea And Vomiting  . Celecoxib Other (See Comments)    Other reaction(s): Other flushed  . Codeine Nausea And Vomiting  . Darvocet [Propoxyphene N-Acetaminophen] Nausea And Vomiting  . Erythromycin Nausea And Vomiting    Nausea and vomiting   . Hydrocodone Nausea And Vomiting  . Ketorolac Tromethamine Nausea And Vomiting  . Nitrofurantoin Nausea And Vomiting  . Oxycodone-Acetaminophen Nausea And Vomiting  . Oxycodone-Acetaminophen Nausea And Vomiting    Nausea and vomiting   . Pregabalin Swelling    Legs swelling   . Tramadol Nausea And Vomiting  . Verapamil Nausea And Vomiting    REACTION: intolerance       PHYSICAL EXAMINATION: Vital signs: BP 118/78 mmHg  Pulse 92  Ht 5' 5.5" (1.664 m)  Wt 167 lb 8 oz (75.978 kg)  BMI 27.44 kg/m2  Constitutional: generally well-appearing, no acute distress Psychiatric: alert and oriented x3, cooperative Eyes: extraocular movements intact, anicteric, conjunctiva pink Mouth: oral pharynx moist, no lesions Neck: supple no lymphadenopathy Cardiovascular: heart regular rate and rhythm, no murmur Lungs: clear to auscultation bilaterally Abdomen: soft, nontender, nondistended, no obvious ascites, no peritoneal signs, normal bowel sounds, no organomegaly. Previous surgical incisions well-healed Rectal: Omitted. Extremities: . Swollen right knee with well-healing incisions. no lower extremity edema bilaterally Skin: no lesions on visible extremities Neuro: No focal deficits.   ASSESSMENT:  #1. Nausea. Likely secondary to medications. Could be  secondary to breakthrough reflux #2. GERD. Experiencing breakthrough symptoms #3. Colon cancer screening. Baseline risk. Last colonoscopy 2010 negative for neoplasia. #4. Chronic constipation. History of rectocele. Anticipating surgical repair.    PLAN:  #1. Increase PPI to twice a day #2. Reflux precautions #3. Reviewed current colon cancer screening guidelines. Based on these, the patient would be appropriate for routine repeat screening April 2020. Of course, sooner if clinically indicated. She understood. #4. Continue stool softeners for constipation. Okay to use MiraLAX if needed. #5. Resume general medical care with your primary care provider. GI follow-up as needed

## 2014-10-08 NOTE — Patient Instructions (Signed)
We have sent the following medications to your pharmacy for you to pick up at your convenience:  Protonix  You have a recall colon scheduled for 01/2019.  You will get a letter ahead of time to remind to you call and schedule it.  Please follow up with Dr. Henrene Pastor as needed.

## 2014-10-15 ENCOUNTER — Encounter: Payer: Self-pay | Admitting: Adult Health

## 2014-10-15 ENCOUNTER — Ambulatory Visit (INDEPENDENT_AMBULATORY_CARE_PROVIDER_SITE_OTHER): Payer: Managed Care, Other (non HMO) | Admitting: Adult Health

## 2014-10-15 VITALS — BP 131/85 | HR 96 | Ht 66.0 in | Wt 160.0 lb

## 2014-10-15 DIAGNOSIS — R413 Other amnesia: Secondary | ICD-10-CM

## 2014-10-15 DIAGNOSIS — Z8739 Personal history of other diseases of the musculoskeletal system and connective tissue: Secondary | ICD-10-CM

## 2014-10-15 MED ORDER — DONEPEZIL HCL 10 MG PO TABS: 10.0000 mg | ORAL_TABLET | Freq: Every day | ORAL | Status: DC

## 2014-10-15 NOTE — Progress Notes (Signed)
PATIENT: Mia [name] DOB: July 02, 1951  REASON FOR VISIT: follow up HISTORY FROM: patient  HISTORY OF PRESENT ILLNESS: Mia [name] is a 63 year old female with a history of memory disorder. She returns today for follow-up. The patient is currently taking Aricept 10 mg daily and tolerating it well. She reports that her memory has stayed the same. She states that she forgets simple things. She will forget where she put things. She is able to complete all ADLs independently. She lives at home with her husband.  She is able to do all the cooking and finances without difficulty. The patient does operate a motor vehicle and reports that she has forgotten how to get to familiar places but when she stops and thinks about it she is able to remember. Her husband feels that she may suffer from depression as well. The patient is currently taking Effexor for the fibromyalgia. The patient is unsure how much this is helping. She recently had knee replacement.    HISTORY 12/14/13 (WILLIS): Mia [name] is a 63 year old right-handed white female with a history of a memory disorder that has been present for 4 or 5 years. The patient was last seen through this office on 08/08/2012. MRI evaluation was done for the memory disorder in December of 2011, and this showed evidence of mild small vessel changes. The patient has seronegative rheumatoid arthritis, and a history of fibromyalgia. The patient reports some recent issues with word finding problems, getting words mixed up. The patient has mild problems with directions while driving, but she still continues to drive. The patient has had some mild gait instability, and she will fall on occasion. The patient required recent surgery on the right shoulder for rotator cuff tear and a partial biceps tendon tear. The patient returns to this office for further evaluation. The patient is on low-dose Aricept, and she is tolerating the medication well.   REVIEW OF SYSTEMS: Out of a  complete 14 system review of symptoms, the patient complains only of the following symptoms, and all other reviewed systems are negative.  Activity change, Blurred vision, constipation, excessive thirst, apnea, joint pain, joint swelling, back pain, aching muscles, walking difficulty, difficulty, memory loss  ALLERGIES: Allergies  Allergen Reactions  . Percocet [Oxycodone-Acetaminophen] Nausea And Vomiting  . Celecoxib Other (See Comments)    Other reaction(s): Other flushed  . Codeine Nausea And Vomiting  . Darvocet [Propoxyphene N-Acetaminophen] Nausea And Vomiting  . Erythromycin Nausea And Vomiting    Nausea and vomiting   . Hydrocodone Nausea And Vomiting  . Ketorolac Tromethamine Nausea And Vomiting  . Nitrofurantoin Nausea And Vomiting  . Oxycodone-Acetaminophen Nausea And Vomiting  . Oxycodone-Acetaminophen Nausea And Vomiting    Nausea and vomiting   . Pregabalin Swelling    Legs swelling   . Tramadol Nausea And Vomiting  . Verapamil Nausea And Vomiting    REACTION: intolerance    HOME MEDICATIONS: Outpatient Prescriptions Prior to Visit  Medication Sig Dispense Refill  . AMBULATORY NON FORMULARY MEDICATION Medication Name: One Touch Ultra extra fine 33 gauge. Check fasting blood sugar every morning and as needed. Dx of Impaired Fasting Glucose Fax to [phone] 50 strip 11  . B-D TB SYRINGE 1CC/27GX1/2" 27G X 1/2" 1 ML MISC Inject 1 each into the muscle once a week.    . calcium carbonate (OS-CAL) 600 MG TABS Take 600 mg by mouth daily with breakfast.     . cetirizine (ZYRTEC) 10 MG tablet Take 10 mg by mouth as  needed for allergies.    . cyclobenzaprine (FLEXERIL) 10 MG tablet Take 1 tablet (10 mg total) by mouth at bedtime. 45 tablet 1  . diclofenac (VOLTAREN) 75 MG EC tablet Take 1 tablet (75 mg total) by mouth 2 (two) times daily. 180 tablet 1  . diclofenac sodium (VOLTAREN) 1 % GEL Apply 2 g topically 3 (three) times daily as needed (join pain).     Marland Kitchen  diltiazem (CARDIZEM CD) 360 MG 24 hr capsule Take 1 capsule (360 mg total) by mouth daily. 90 capsule 3  . Docusate Calcium (STOOL SOFTENER PO) Take 1 tablet by mouth daily.    Marland Kitchen donepezil (ARICEPT) 10 MG tablet Take 1 tablet (10 mg total) by mouth at bedtime. 30 tablet 0  . fluticasone (FLONASE) 50 MCG/ACT nasal spray Place into both nostrils as needed for allergies or rhinitis.    . folic acid (FOLVITE) 1 MG tablet Take 1 mg by mouth 2 (two) times daily.     . furosemide (LASIX) 20 MG tablet Take 1 tablet (20 mg total) by mouth daily. 90 tablet 1  . meperidine (DEMEROL) 50 MG tablet Take 50 mg by mouth as needed for severe pain.    . metFORMIN (GLUCOPHAGE) 500 MG tablet Take 1 tablet (500 mg total) by mouth 2 (two) times daily with a meal. 60 tablet 11  . morphine (MSIR) 15 MG tablet Take 15-30 mg by mouth every 4 (four) hours as needed for severe pain (1-2 tabs as needed).     . Multiple Vitamin (MULTIVITAMIN) capsule Take 1 capsule by mouth daily.    . NON FORMULARY Place into the nose at bedtime. cpap machine    . pantoprazole (PROTONIX) 40 MG tablet Take 1 tablet (40 mg total) by mouth 2 (two) times daily. 180 tablet 3  . propranolol (INDERAL) 10 MG tablet Take 10 mg by mouth 3 (three) times daily as needed.     . SUMAtriptan (IMITREX) 25 MG tablet Take 25 mg by mouth as needed for migraine or headache. May repeat in 2 hours if headache persists or recurs.    . venlafaxine XR (EFFEXOR-XR) 75 MG 24 hr capsule Take 75 mg by mouth daily with breakfast.    . vitamin D, CHOLECALCIFEROL, 400 UNITS tablet Take 400 Units by mouth daily.      No facility-administered medications prior to visit.    PAST MEDICAL HISTORY: Past Medical History  Diagnosis Date  . SVT (supraventricular tachycardia)   . Sleep apnea   . Raynaud phenomenon   . Edema   . Anemia   . Arthritis   . Blood transfusion   . Diabetes mellitus   . GERD (gastroesophageal reflux disease)     gastritis  . Hypertension   .  Neuromuscular disorder     sclerosis  . Pseudogout   . Thyroid disease     hypothyroidism  . Hemorrhoids   . Rectal bleeding   . PONV (postoperative nausea and vomiting)   . Memory difficulty 12/14/2013  . Fibromyalgia   . Anal fissure   . Gall stones   . Interstitial cystitis     PAST SURGICAL HISTORY: Past Surgical History  Procedure Laterality Date  . Cholecystectomy    . Appendectomy    . Tonsillectomy    . Hysterectomy - unknown type    . Knee surgery      x6  . Oophorectomy    . Dilation and curettage of uterus    . Breast biopsy    .  Shoulder surgery      bilateral- bones spur  . Neck surgery      fusion  . Quadriceps repair Right   . Bladder surgery      x2  . Cataract extraction Bilateral   . Rectocele repair      x2  . Enterocele repair      x2  . Cardiac catheterization    . Eye surgery      retina  . Shoulder arthroscopy with subacromial decompression, rotator cuff repair and bicep tendon repair  10/06/2012    Procedure: SHOULDER ARTHROSCOPY WITH SUBACROMIAL DECOMPRESSION, ROTATOR CUFF REPAIR AND BICEP TENDON REPAIR;  Surgeon: Nita Sells, MD;  Location: [facility];  Service: Orthopedics;  Laterality: Right;  Arthroscopic  Repair  of  Subscapularis, Open Biceps Tenodesis  . Bladder surgery      FAMILY HISTORY: Family History  Problem Relation Age of Onset  . Heart attack Mother   . Stroke Mother   . Diabetes Mother   . Breast cancer Maternal Grandmother   . Pancreatic cancer Maternal Grandfather   . Heart disease Mother   . Heart disease Father   . Motor neuron disease Sister   . Stroke Sister   . Lupus Sister   . Asthma Sister   . Colon polyps Mother   . Colon polyps Sister   . Heart attack Father   . Irritable bowel syndrome Sister   . Wilson's disease Maternal Grandmother     SOCIAL HISTORY: History   Social History  . Marital Status: Married    Spouse Name: N/A    Number of Children: 2  . Years of  Education: N/A   Occupational History  . Retired    Social History Main Topics  . Smoking status: Never Smoker   . Smokeless tobacco: Never Used  . Alcohol Use: No  . Drug Use: No  . Sexual Activity: Not on file   Other Topics Concern  . Not on file   Social History Narrative      PHYSICAL EXAM  Filed Vitals:   10/15/14 1335  BP: 131/85  Pulse: 96  Height: 5\' 6"  (4.580 m)  Weight: 160 lb (72.576 kg)   Body mass index is 25.84 kg/(m^2).  Generalized: Well developed, in no acute distress   Neurological examination  Mentation: Alert oriented to time, place, history taking. Follows all commands speech and language fluent Cranial nerve II-XII: Pupils were equal round reactive to light. Extraocular movements were full, visual field were full on confrontational test. Facial sensation and strength were normal. Uvula tongue midline. Head turning and shoulder shrug  were normal and symmetric. Motor: The motor testing reveals 5 over 5 strength of all 4 extremities. Unable to test the right leg due to recent surgery. Good symmetric motor tone is noted throughout.  Sensory: Sensory testing is intact to soft touch on all 4 extremities. No evidence of extinction is noted.  Coordination: Cerebellar testing reveals good finger-nose-finger and heel-to-shin on the left unable to test the right due to a brace. Gait and station: Patient has a limping gait on the right due to recent knee surgery. She is using a walker to ambulate.  Reflexes: Deep tendon reflexes are symmetric and normal bilaterally.    DIAGNOSTIC DATA (LABS, IMAGING, TESTING) - I reviewed patient records, labs, notes, testing and imaging myself where available.  Lab Results  Component Value Date   WBC 9.5 07/31/2014   HGB 13.4 07/31/2014   HCT  39.1 07/31/2014   MCV 93.3 07/31/2014   PLT 447* 07/31/2014      Component Value Date/Time   NA 140 03/09/2014 1129   K 4.2 03/09/2014 1129   CL 100 03/09/2014 1129   CO2  29 03/09/2014 1129   GLUCOSE 88 03/09/2014 1129   BUN 14 03/09/2014 1129   CREATININE 0.84 03/09/2014 1129   CREATININE 1.0 01/16/2014 0956   CALCIUM 9.8 03/09/2014 1129   PROT 7.1 03/09/2014 1129   ALBUMIN 4.5 03/09/2014 1129   AST 23 03/09/2014 1129   ALT 29 03/09/2014 1129   ALKPHOS 84 03/09/2014 1129   BILITOT 0.6 03/09/2014 1129   GFRNONAA 75 03/09/2014 1129   GFRNONAA >60 07/22/2011 1500   GFRAA 86 03/09/2014 1129   GFRAA >60 07/22/2011 1500   Lab Results  Component Value Date   CHOL 178 01/16/2014   HDL 57.30 01/16/2014   LDLCALC 107* 01/16/2014   TRIG 67.0 01/16/2014   CHOLHDL 3 01/16/2014   Lab Results  Component Value Date   HGBA1C 5.8 06/11/2014   No results found for: [id number] Lab Results  Component Value Date   TSH 0.945 03/09/2014      ASSESSMENT AND PLAN 63 y.o. year old female  has a past medical history of SVT (supraventricular tachycardia); Sleep apnea; Raynaud phenomenon; Edema; Anemia; Arthritis; Blood transfusion; Diabetes mellitus; GERD (gastroesophageal reflux disease); Hypertension; Neuromuscular disorder; Pseudogout; Thyroid disease; Hemorrhoids; Rectal bleeding; PONV (postoperative nausea and vomiting); Memory difficulty (12/14/2013); Fibromyalgia; Anal fissure; Gall stones; and Interstitial cystitis. here with:  1. Memory loss 2. Fibromyalgia  Overall the patient's memory has remained the same. Her MMSE is 29/30 today. She will continue taking Aricept 10 mg daily. I will send in a refill today. I have recommended that she participate in neuropsychological testing. The patient states that she may consider  this after she heals from her recent knee surgery. The patient will continue taking Effexor for fibromyalgia. If her symptoms worsen or she develops new symptoms she will let us know. She will follow-up in 6 months.   Gutierres Givens, MSN, NP-C 10/15/2014, 1:33 PM [facility] [address] [place], [place]  [phone]  Note: This document was prepared with digital dictation and possible smart phrase technology. Any transcriptional errors that result from this process are unintentional.

## 2014-10-15 NOTE — Progress Notes (Signed)
I have read the note, and I agree with the clinical assessment and plan.  WILLIS,CHARLES KEITH   

## 2014-10-15 NOTE — Patient Instructions (Signed)
Continue Aricept 10 mg daily. In the future we may consider neuropsychological testing.  Continue using Effexor for fibromyalgia.  If your symptoms worsen or you develop new symptoms let us know.

## 2014-10-25 ENCOUNTER — Encounter (HOSPITAL_BASED_OUTPATIENT_CLINIC_OR_DEPARTMENT_OTHER): Admission: RE | Payer: Self-pay | Source: Ambulatory Visit

## 2014-10-25 ENCOUNTER — Ambulatory Visit (HOSPITAL_BASED_OUTPATIENT_CLINIC_OR_DEPARTMENT_OTHER): Admission: RE | Admit: 2014-10-25 | Payer: Medicare HMO | Source: Ambulatory Visit | Admitting: General Surgery

## 2014-10-25 SURGERY — COLPORRHAPHY, POSTERIOR, FOR RECTOCELE REPAIR
Anesthesia: General

## 2014-10-29 MED ORDER — CITALOPRAM 10 MG TAB
10 mg | ORAL_TABLET | ORAL | Status: DC
Start: 2014-10-29 — End: 2015-12-30

## 2014-11-01 DIAGNOSIS — G4733 Obstructive sleep apnea (adult) (pediatric): Secondary | ICD-10-CM | POA: Diagnosis not present

## 2014-11-05 NOTE — Telephone Encounter (Signed)
Pt seen

## 2014-11-06 DIAGNOSIS — Z96651 Presence of right artificial knee joint: Secondary | ICD-10-CM | POA: Diagnosis not present

## 2014-11-06 DIAGNOSIS — R2689 Other abnormalities of gait and mobility: Secondary | ICD-10-CM | POA: Diagnosis not present

## 2014-11-06 DIAGNOSIS — Z471 Aftercare following joint replacement surgery: Secondary | ICD-10-CM | POA: Diagnosis not present

## 2014-11-07 ENCOUNTER — Other Ambulatory Visit: Payer: Self-pay | Admitting: *Deleted

## 2014-11-07 MED ORDER — FUROSEMIDE 20 MG PO TABS: 20.0000 mg | ORAL_TABLET | Freq: Every day | ORAL | Status: DC

## 2014-11-19 DIAGNOSIS — M25561 Pain in right knee: Secondary | ICD-10-CM | POA: Diagnosis not present

## 2014-11-19 DIAGNOSIS — M7061 Trochanteric bursitis, right hip: Secondary | ICD-10-CM | POA: Diagnosis not present

## 2014-11-21 ENCOUNTER — Telehealth: Payer: Self-pay | Admitting: Cardiovascular Disease

## 2014-11-21 NOTE — Telephone Encounter (Signed)
Spoke with patient who states cost of Diltiazem recently increased significantly since changing insurance plans this year and she is unable to afford it - has not taken since Sunday, Jan. 24.  I advised patient that I will discuss with Dr. Acie Fredrickson and contact her insurance company to find out what medication will be approved in the place of Diltiazem.   Patient proceeds to report recent episodes of chest discomfort, a "fluttering" feeling and pain that radiates into jaw and needing to "prop herself up on a wedge" when laying down.  Patient states pain occurs when she is sitting, states she has not participated in any strenuous activity but had pain while with her husband at a doctor's appointment yesterday and once after returning home from shopping.  Patient states these episodes have occurred over the last few weeks, none today.  Reports recent blood pressure was 151/90 and heart rate is approximately 90 bpm.  Patient states this does not feel like symptoms of SVT; denies SOB.  Patient would like to know if she should see Dr. Acie Fredrickson prior to May follow-up.  I advised patient that the jaw pain is concerning and I think she needs to be seen tomorrow.  I offered appointment with Dr. Acie Fredrickson for tomorrow afternoon, patient states she is only available in the morning tomorrow.  I scheduled patient to see Ignacia Bayley, NP tomorrow 1/28 at 9:00.  Patient verbalized understanding and agreement.

## 2014-11-21 NOTE — Telephone Encounter (Signed)
New message      Pt cannot afford dilitiazem.  Is there something else she can take

## 2014-11-21 NOTE — Telephone Encounter (Signed)
Left message for patient to call office to discuss cost of medication

## 2014-11-21 NOTE — Telephone Encounter (Signed)
I attempted to call [employer] to get information regarding Diltiazem cost and after being transferred multiple times I was cut off by an employee after 17 minutes.  I called [facility]'s pharmacy, [facility], and was informed that patient transferred the medication to [facility] and currently [facility] is unable to process Diltiazem with the insurance company because Vladimir Faster has billed the insurance company for the medication.  I left a message for the patient to call me back; if patient is not going to pick up medication at [facility] she needs to notify them to release the hold.

## 2014-11-21 NOTE — Telephone Encounter (Signed)
Agree with note from Michelle Swinyer, RN  

## 2014-11-22 ENCOUNTER — Encounter: Payer: Self-pay | Admitting: Nurse Practitioner

## 2014-11-22 ENCOUNTER — Ambulatory Visit (INDEPENDENT_AMBULATORY_CARE_PROVIDER_SITE_OTHER): Payer: Medicare Other | Admitting: Nurse Practitioner

## 2014-11-22 VITALS — BP 152/100 | HR 96 | Resp 20 | Wt 164.4 lb

## 2014-11-22 DIAGNOSIS — R0601 Orthopnea: Secondary | ICD-10-CM | POA: Diagnosis not present

## 2014-11-22 DIAGNOSIS — R0600 Dyspnea, unspecified: Secondary | ICD-10-CM | POA: Diagnosis not present

## 2014-11-22 DIAGNOSIS — I10 Essential (primary) hypertension: Secondary | ICD-10-CM | POA: Diagnosis not present

## 2014-11-22 DIAGNOSIS — R0789 Other chest pain: Secondary | ICD-10-CM

## 2014-11-22 DIAGNOSIS — R079 Chest pain, unspecified: Secondary | ICD-10-CM | POA: Insufficient documentation

## 2014-11-22 DIAGNOSIS — R072 Precordial pain: Secondary | ICD-10-CM | POA: Diagnosis not present

## 2014-11-22 DIAGNOSIS — R0602 Shortness of breath: Secondary | ICD-10-CM | POA: Insufficient documentation

## 2014-11-22 MED ORDER — METOPROLOL TARTRATE 50 MG PO TABS: 50.0000 mg | ORAL_TABLET | Freq: Two times a day (BID) | ORAL | Status: DC

## 2014-11-22 NOTE — Progress Notes (Signed)
Patient Name: Mia [name] Date of Encounter: 11/22/2014  Primary Care Provider:  [name], MD Primary Cardiologist:  Joaquim Nam, MD   Chief Complaint  64 year old female with a history of chronic dyspnea who presents today secondary to a 3 week history of orthopnea and 2 episodes of chest pain.  Past Medical History   Past Medical History  Diagnosis Date  . SVT (supraventricular tachycardia)   . Sleep apnea     a. on cpap.  . Raynaud phenomenon   . Anemia   . Arthritis   . Blood transfusion   . Diabetes mellitus   . GERD (gastroesophageal reflux disease)     gastritis  . Hypertension   . Neuromuscular disorder     sclerosis  . Pseudogout   . Thyroid disease     hypothyroidism  . Hemorrhoids   . Rectal bleeding   . PONV (postoperative nausea and vomiting)   . Memory difficulty 12/14/2013  . Fibromyalgia   . Anal fissure   . Gall stones   . Interstitial cystitis   . Chronic Dyspnea     a. 01/2013 Echo: EF 60-65%, Gr 1 DD, PASP 1mmHg.  . Chest pain     a. 01/2013 MV: EF 59%, no ischemia.   Past Surgical History  Procedure Laterality Date  . Cholecystectomy    . Appendectomy    . Tonsillectomy    . Hysterectomy - unknown type    . Knee surgery      x6  . Oophorectomy    . Dilation and curettage of uterus    . Breast biopsy    . Shoulder surgery      bilateral- bones spur  . Neck surgery      fusion  . Quadriceps repair Right   . Bladder surgery      x2  . Cataract extraction Bilateral   . Rectocele repair      x2  . Enterocele repair      x2  . Cardiac catheterization    . Eye surgery      retina  . Shoulder arthroscopy with subacromial decompression, rotator cuff repair and bicep tendon repair  10/06/2012    Procedure: SHOULDER ARTHROSCOPY WITH SUBACROMIAL DECOMPRESSION, ROTATOR CUFF REPAIR AND BICEP TENDON REPAIR;  Surgeon: Nita Sells, MD;  Location: [facility];  Service: Orthopedics;  Laterality: Right;   Arthroscopic  Repair  of  Subscapularis, Open Biceps Tenodesis  . Bladder surgery      Allergies  Allergies  Allergen Reactions  . Percocet [Oxycodone-Acetaminophen] Nausea And Vomiting  . Celecoxib Other (See Comments)    Other reaction(s): Other flushed  . Codeine Nausea And Vomiting  . Darvocet [Propoxyphene N-Acetaminophen] Nausea And Vomiting  . Erythromycin Nausea And Vomiting    Nausea and vomiting   . Hydrocodone Nausea And Vomiting  . Ketorolac Tromethamine Nausea And Vomiting  . Nitrofurantoin Nausea And Vomiting  . Oxycodone-Acetaminophen Nausea And Vomiting  . Oxycodone-Acetaminophen Nausea And Vomiting    Nausea and vomiting   . Pregabalin Swelling    Legs swelling   . Tramadol Nausea And Vomiting  . Verapamil Nausea And Vomiting    REACTION: intolerance    HPI  64 year old female with the above complex problem list. She has a history of chronic dyspnea as well as SVT. SVT has been well managed on oral calcium channel blocker therapy over the years and she has not had any recurrence. Unfortunately, she can no longer afford diltiazem as  it is over $300 a month and was taken off of the $4 lists at local pharmacies. She is interested in and using an alternate therapy.  Over the past month, she's had 2 episodes of substernal chest discomfort with radiation to the jaw. Both occurred while just walking around the house and resolve spontaneously. She continues to have chronic dyspnea on exertion and occasionally dyspnea at rest. Over the past 3 weeks, she is also had orthopnea and has been sleeping on multiple pillows. This is new for her. Despite the episodes of chest pain and dyspnea, she has been walking readily with her husband and has not had any significant limitations in doing this. She denies PND, edema, early satiety.   Home Medications  Prior to Admission medications   Medication Sig Start Date End Date Taking? Authorizing Provider  calcium carbonate (OS-CAL)  600 MG TABS Take 600 mg by mouth daily with breakfast.     Historical Provider, MD  cetirizine (ZYRTEC) 10 MG tablet Take 10 mg by mouth as needed for allergies.    Historical Provider, MD  cyclobenzaprine (FLEXERIL) 10 MG tablet Take 1 tablet (10 mg total) by mouth at bedtime. 06/13/14   Jade L Breeback, PA-C  diclofenac (VOLTAREN) 75 MG EC tablet Take 1 tablet (75 mg total) by mouth 2 (two) times daily. 07/31/14   Hali Marry, MD  diclofenac sodium (VOLTAREN) 1 % GEL Apply 2 g topically 3 (three) times daily as needed (join pain).     Historical Provider, MD  diltiazem (CARDIZEM CD) 360 MG 24 hr capsule Take 1 capsule (360 mg total) by mouth daily. 01/15/14   Thayer Headings, MD  donepezil (ARICEPT) 10 MG tablet Take 1 tablet (10 mg total) by mouth at bedtime. 10/15/14   Vivanco Givens, NP  fluticasone (FLONASE) 50 MCG/ACT nasal spray Place into both nostrils as needed for allergies or rhinitis.    Historical Provider, MD  folic acid (FOLVITE) 1 MG tablet Take 1 mg by mouth 2 (two) times daily.  07/29/12   Historical Provider, MD  furosemide (LASIX) 20 MG tablet Take 1 tablet (20 mg total) by mouth daily. 11/07/14   Hali Marry, MD  meperidine (DEMEROL) 50 MG tablet Take 50 mg by mouth as needed for severe pain.    Historical Provider, MD  metFORMIN (GLUCOPHAGE) 500 MG tablet Take 1 tablet (500 mg total) by mouth 2 (two) times daily with a meal. 03/13/14   Hali Marry, MD  morphine (MSIR) 15 MG tablet Take 15-30 mg by mouth every 4 (four) hours as needed for severe pain (1-2 tabs as needed).     Historical Provider, MD  Multiple Vitamin (MULTIVITAMIN) capsule Take 1 capsule by mouth daily.    Historical Provider, MD  NON FORMULARY Place into the nose at bedtime. cpap machine    Historical Provider, MD  pantoprazole (PROTONIX) 40 MG tablet Take 1 tablet (40 mg total) by mouth 2 (two) times daily. 10/08/14   Irene Shipper, MD  propranolol (INDERAL) 10 MG tablet Take 10 mg by mouth  3 (three) times daily as needed.     Historical Provider, MD  SUMAtriptan (IMITREX) 25 MG tablet Take 25 mg by mouth as needed for migraine or headache. May repeat in 2 hours if headache persists or recurs.    Historical Provider, MD  venlafaxine XR (EFFEXOR-XR) 75 MG 24 hr capsule Take 75 mg by mouth daily with breakfast.    Historical Provider, MD  vitamin D,  CHOLECALCIFEROL, 400 UNITS tablet Take 400 Units by mouth daily.     Historical Provider, MD    Review of Systems  She's chronic dyspnea. Over the past 3 weeks she has had orthopnea. She is also had 2 episodes of chest pain with radiation to the jaw which resolved spontaneous.  All other systems reviewed and are otherwise negative except as noted above.  Physical Exam  VS:  BP 152/100 mmHg  Pulse 96  Resp 20  Wt 164 lb 6.4 oz (74.571 kg)  SpO2 99% , BMI Body mass index is 26.55 kg/(m^2). GEN: Well nourished, well developed, in no acute distress. HEENT: normal. Neck: Supple, no JVD, carotid bruits, or masses. Cardiac: RRR, no murmurs, rubs, or gallops. No clubbing, cyanosis, edema.  Radials/DP/PT 2+ and equal bilaterally.  Respiratory:  Respirations regular and unlabored, clear to auscultation bilaterally. GI: Soft, nontender, nondistended, BS + x 4. MS: no deformity or atrophy. Skin: warm and dry, no rash. Neuro:  Strength and sensation are intact. Psych: Normal affect.  Accessory Clinical Findings  ECG - regular sinus rhythm, 96, no acute ST or T changes.  Assessment & Plan  1.  Midsternal chest pain: Over the past month, patient had 2 episodes of midsternal chest discomfort with radiation to the jaw. Both episodes occurred while walking around the house and resolve spontaneously. Her last stress test was in April 2014 and was nonischemic. We will repeat a Lexiscan Myoview to rule out ischemia.  2. Chronic dyspnea with new orthopnea: Over the past 3 weeks, she has been having trouble lying flat secondary to dyspnea and  orthopnea.  Her weight at home is actually down and she has not had any evidence of volume overload. She is euvolemic on exam today. She does take Lasix 20 mg daily at home. I will make no changes to this. The etiology of her orthopnea is not clear in the setting of euvolemic. Her last echo was in April 2014 and I will repeat this.  3. History of supraventricular tachycardia: No recent SVT. Baseline heart rates tend to run in the 90s for her. She is unable to to afford diltiazem as it is no longer on a $4 list. After reviewing pharmacy lists and discussing it with Mrs. Raybourn, I will switch her from diltiazem to metoprolol 50 mg twice a day. She may ultimately require more metoprolol at this however given her multiple drug allergies I didn't want to start too high a dose. She will contact us if she develops any significant side effects.  4.  HTN: BP elevated this AM. She is out of dilt.  Adding bb as above.  She is also on lasix.  She will follow bp @ home.  4. Disposition: Echo and stress test as above. Follow-up with Dr. Acie Fredrickson in 1 month or sooner if necessary.  Murray Hodgkins, NP 11/22/2014, 10:11 AM

## 2014-11-22 NOTE — Patient Instructions (Signed)
Your physician has recommended you make the following change in your medication:   Start Metoprolol 50 mg twice daily by mouth.  Stop Diltiazem 360 mg daily  Your physician recommends that you schedule a follow-up appointment in: 1 month with Dr. Acie Fredrickson.  Your physician has requested that you have an echocardiogram. Echocardiography is a painless test that uses sound waves to create images of your heart. It provides your doctor with information about the size and shape of your heart and how well your heart's chambers and valves are working. This procedure takes approximately one hour. There are no restrictions for this procedure.  Your physician has requested that you have a lexiscan myoview. For further information please visit [email or web address]. Please follow instruction sheet, as given.

## 2014-11-22 NOTE — Telephone Encounter (Signed)
Spoke with patient in person in the office during office visit with Ignacia Bayley, NP. Ignacia Bayley, NP recommends patient switch to Metoprolol 50 mg BID in the place of Diltiazem as all forms of Diltiazem are going to be expensive for patient.  Patient verbalized understanding and agreement that Metoprolol may cause feelings of fatigue, especially the first 2 weeks.  I advised patient to be aware of this as her body adjusts to the medication change and to call our office if she is unable to tolerate.  Patient verbalized understanding and agreement with plan of care.

## 2014-11-29 ENCOUNTER — Ambulatory Visit (HOSPITAL_BASED_OUTPATIENT_CLINIC_OR_DEPARTMENT_OTHER): Payer: Medicare Other | Admitting: Radiology

## 2014-11-29 ENCOUNTER — Ambulatory Visit (HOSPITAL_COMMUNITY): Payer: Medicare Other | Attending: Cardiology

## 2014-11-29 DIAGNOSIS — R072 Precordial pain: Secondary | ICD-10-CM

## 2014-11-29 DIAGNOSIS — R0600 Dyspnea, unspecified: Secondary | ICD-10-CM

## 2014-11-29 MED ORDER — REGADENOSON 0.4 MG/5ML IV SOLN
0.4000 mg | Freq: Once | INTRAVENOUS | Status: AC
  Administered 2014-11-29: 0.4 mg via INTRAVENOUS

## 2014-11-29 MED ORDER — TECHNETIUM TC 99M SESTAMIBI GENERIC - CARDIOLITE
10.0000 | Freq: Once | INTRAVENOUS | Status: AC | PRN
  Administered 2014-11-29: 10 via INTRAVENOUS

## 2014-11-29 MED ORDER — TECHNETIUM TC 99M SESTAMIBI GENERIC - CARDIOLITE
30.0000 | Freq: Once | INTRAVENOUS | Status: AC | PRN
  Administered 2014-11-29: 30 via INTRAVENOUS

## 2014-11-29 NOTE — Progress Notes (Signed)
2D Echo completed. 11/29/2014

## 2014-11-29 NOTE — Progress Notes (Signed)
[facility] [address] [place], [place] [address] [phone]    Cardiology Nuclear Med Study  Mia [name] is a 64 y.o. female     MRN : [id number]     DOB: [birth year]  Procedure Date: 11/29/2014  Nuclear Med Background Indication for Stress Test:  Evaluation for Ischemia and Follow up CAD History:  Non-obstructive CAD. MPI 2014 (normal) EF 59% Cardiac Risk Factors: Hypertension and NIDDM  Symptoms:  Chest Pain, DOE and SOB   Nuclear Pre-Procedure Caffeine/Decaff Intake:  None> 12 hrs NPO After: 8:00pm   Lungs:  clear O2 Sat: 95% on room air. IV 0.9% NS with Angio Cath:  22g  IV Site: L Forearm x 1, tolerated well IV Started by:  Irven Baltimore, RN  Chest Size (in):  38 Cup Size: D  Height: 5\' 6"  (1.676 m)  Weight:  161 lb (73.029 kg)  BMI:  Body mass index is 26 kg/(m^2). Tech Comments:  No Medications this am per patient. Irven Baltimore, RN.    Nuclear Med Study 1 or 2 day study: 1 day  Stress Test Type:  Treadmill/Lexiscan  Reading MD: N/A  Order Authorizing Provider:  Mertie Moores, MD, and Murray Hodgkins, NP  Resting Radionuclide: Technetium 25m Sestamibi  Resting Radionuclide Dose: 11.0 mCi   Stress Radionuclide:  Technetium 54m Sestamibi  Stress Radionuclide Dose: 33.0 mCi           Stress Protocol Rest HR: 63 Stress HR: 131  Rest BP: 126/77 Stress BP: 127/77  Exercise Time (min): n/a METS: n/a   Predicted Max HR: 157 bpm % Max HR: 83.44 bpm Rate Pressure Product: 17947   Dose of Adenosine (mg):  n/a Dose of Lexiscan: 0.4 mg  Dose of Atropine (mg): n/a Dose of Dobutamine: n/a mcg/kg/min (at max HR)  Stress Test Technologist: Glade Lloyd, BS-ES  Nuclear Technologist:  Earl Many, CNMT     Rest Procedure:  Myocardial perfusion imaging was performed at rest 45 minutes following the intravenous administration of Technetium 64m Sestamibi. Rest ECG: NSR - Normal EKG  Stress Procedure:  The patient received IV Lexiscan  0.4 mg over 15-seconds with concurrent low level exercise and then Technetium 58m Sestamibi was injected at 30-seconds while the patient continued walking one more minute.  Quantitative spect images were obtained after a 45-minute delay.  During the infusion of Lexiscan the patient complained of SOB, lightheadedness and chest tightness.  These symptoms began to resolve in recovery.  Stress ECG: No significant change from baseline ECG  QPS Raw Data Images:  Normal; no motion artifact; normal heart/lung ratio. Stress Images:  Normal homogeneous uptake in all areas of the myocardium. Rest Images:  Normal homogeneous uptake in all areas of the myocardium. Subtraction (SDS):  No evidence of ischemia. Transient Ischemic Dilatation (Normal <1.22):  0.89 Lung/Heart Ratio (Normal <0.45):  0.28  Quantitative Gated Spect Images QGS EDV:  67 ml QGS ESV:  29 ml  Impression Exercise Capacity:  Lexiscan with low level exercise. BP Response:  Normal blood pressure response. Clinical Symptoms:  No significant symptoms noted. ECG Impression:  No significant ECG changes with Lexiscan. Comparison with Prior Nuclear Study: No significant change from previous study  Overall Impression:  Normal stress nuclear study.  LV Ejection Fraction: 57%.  LV Wall Motion:  NL LV Function; NL Wall Motion   Sanda Klein, MD, [facility] [phone] office [phone] pager

## 2014-11-30 ENCOUNTER — Telehealth: Payer: Self-pay | Admitting: Nurse Practitioner

## 2014-11-30 ENCOUNTER — Telehealth: Payer: Self-pay | Admitting: *Deleted

## 2014-11-30 MED ORDER — SILVER SULFADIAZINE 1 % EX CREA: 1.0000 "application " | TOPICAL_CREAM | Freq: Every day | CUTANEOUS | Status: DC

## 2014-11-30 NOTE — Telephone Encounter (Signed)
Normal echo             ----- Message -----     From: Emmaline Life, RN     Sent: 11/30/2014  2:35 PM      To: Thayer Headings, MD            Reviewed result with patient who verbalized understanding and gratitude
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
## 2014-12-02 DIAGNOSIS — G4733 Obstructive sleep apnea (adult) (pediatric): Secondary | ICD-10-CM | POA: Diagnosis not present

## 2014-12-04 ENCOUNTER — Other Ambulatory Visit: Payer: Self-pay | Admitting: Family Medicine

## 2014-12-05 ENCOUNTER — Other Ambulatory Visit: Payer: Self-pay | Admitting: Physician Assistant

## 2014-12-05 ENCOUNTER — Ambulatory Visit
Admit: 2014-12-05 | Discharge: 2014-12-05 | Payer: PRIVATE HEALTH INSURANCE | Attending: Internal Medicine | Primary: Internal Medicine

## 2014-12-05 DIAGNOSIS — J40 Bronchitis, not specified as acute or chronic: Secondary | ICD-10-CM

## 2014-12-05 MED ORDER — DOXYCYCLINE HYCLATE 100 MG CAP
100 mg | ORAL_CAPSULE | Freq: Two times a day (BID) | ORAL | Status: AC
Start: 2014-12-05 — End: 2014-12-15

## 2014-12-05 NOTE — Progress Notes (Signed)
64 y.o. white female who presents for evaluation.    She's had uri sx the last 2 weeks now.  Knows of no exposure, no wheezing, dyspnea, fever.  Denied sinus sx, mild sore throat noted, no ear pain or swallowing or gi issues.  Been using mucinex which helps    Past Medical History   Diagnosis Date   ??? 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 Multiple lung nodules      no change 10/06, 03/07, 03/08   ??? Anxiety state, unspecified    ??? Palpitations 2008     neg thallium 2008, nl holter 2008, echo nl lv/ef 65%/tr mr/dd/nl pasp   ??? Prediabetes    ??? Vitamin D deficiency    ??? Dyslipidemia    ??? Compression fx, thoracic spine (HCC) 2007     negative DEXA Dr. Marisa Hua   ??? Arthritis      Dr. Synetta Shadow, Dr. Marisa Hua   ??? Frozen shoulder      right Dr. Wynonia Hazard MRI   ??? Overweight (BMI 25.0-29.9)      Current Outpatient Prescriptions   Medication Sig   ??? doxycycline (VIBRAMYCIN) 100 mg capsule Take 1 Cap by mouth two (2) times a day for 10 days.   ??? citalopram (CELEXA) 10 mg tablet take 1 tablet by mouth once daily   ??? VITAMIN D2 50,000 unit capsule take 1 capsule by mouth every week   ??? atorvastatin (LIPITOR) 40 mg tablet Take 1 Tab by mouth daily.   ??? lansoprazole (PREVACID) 30 mg capsule Take 1 Cap by mouth Daily (before breakfast).     No current facility-administered medications for this visit.     Allergies   Allergen Reactions   ??? Fish Oil Nausea Only   ??? Relafen [Nabumetone] Nausea Only       Visit Vitals   Item Reading   ??? BP 106/80 mmHg   ??? Pulse 76   ??? Temp(Src) 97.7 ??F (36.5 ??C) (Oral)   ??? Ht 5\' 4"  (1.626 m)   ??? Wt 168 lb (76.204 kg)   ??? BMI 28.82 kg/m2   tm, wnl, sinuses nt, op benign, no nodes.  Lungs cta, heart showed rrr, abd soft and nt    Assessment and plan:  1. Bronchitis.  Doxy given, cont otc meds, call if no better      Above conditions discussed at length and patient vocalized understanding.  All questions answered to patient satifaction

## 2014-12-05 NOTE — Progress Notes (Signed)
1. Have you been to the ER, urgent care clinic or hospitalized since your last visit? NO.     2. Have you seen or consulted any other health care providers outside of the [facility] since your last visit (Include any pap smears or colon screening)? NO      Do you have an Advanced Directive? YES    Would you like information on Advanced Directives? NO

## 2014-12-05 NOTE — Progress Notes (Signed)
pls call  zpak might interfere w citaloprm so called in doxy instead

## 2014-12-06 NOTE — Progress Notes (Signed)
Pt aware

## 2014-12-07 DIAGNOSIS — Z96651 Presence of right artificial knee joint: Secondary | ICD-10-CM | POA: Diagnosis not present

## 2014-12-07 DIAGNOSIS — Z471 Aftercare following joint replacement surgery: Secondary | ICD-10-CM | POA: Diagnosis not present

## 2014-12-07 DIAGNOSIS — R2689 Other abnormalities of gait and mobility: Secondary | ICD-10-CM | POA: Diagnosis not present

## 2014-12-17 ENCOUNTER — Telehealth: Payer: Self-pay | Admitting: Family Medicine

## 2014-12-17 ENCOUNTER — Telehealth: Payer: Self-pay | Admitting: *Deleted

## 2014-12-17 DIAGNOSIS — Z79899 Other long term (current) drug therapy: Secondary | ICD-10-CM | POA: Diagnosis not present

## 2014-12-17 DIAGNOSIS — M069 Rheumatoid arthritis, unspecified: Secondary | ICD-10-CM | POA: Diagnosis not present

## 2014-12-17 DIAGNOSIS — M170 Bilateral primary osteoarthritis of knee: Secondary | ICD-10-CM | POA: Diagnosis not present

## 2014-12-17 DIAGNOSIS — M19041 Primary osteoarthritis, right hand: Secondary | ICD-10-CM | POA: Diagnosis not present

## 2014-12-17 NOTE — Telephone Encounter (Signed)
Please call patient and let her know that her insurance will no longer cover the cyclobenzaprine which is a muscle relaxer. They will pay for tizanidine instead.       [phone] (M) [phone] (H)    Left VM about insurance company medication coverage

## 2014-12-17 NOTE — Telephone Encounter (Signed)
Please call patient and let her know that her insurance will no longer cover the cyclobenzaprine which is a muscle relaxer. They will pay for tizanidine instead.

## 2014-12-20 NOTE — Telephone Encounter (Signed)
Pt informed she is aware.Mia [name], [name]

## 2014-12-21 ENCOUNTER — Ambulatory Visit (INDEPENDENT_AMBULATORY_CARE_PROVIDER_SITE_OTHER): Payer: Medicare Other | Admitting: Family Medicine

## 2014-12-21 ENCOUNTER — Encounter: Payer: Self-pay | Admitting: Family Medicine

## 2014-12-21 VITALS — BP 147/74 | HR 78 | Ht 66.0 in | Wt 165.0 lb

## 2014-12-21 DIAGNOSIS — Z1159 Encounter for screening for other viral diseases: Secondary | ICD-10-CM | POA: Diagnosis not present

## 2014-12-21 DIAGNOSIS — R7301 Impaired fasting glucose: Secondary | ICD-10-CM

## 2014-12-21 DIAGNOSIS — R635 Abnormal weight gain: Secondary | ICD-10-CM

## 2014-12-21 DIAGNOSIS — M8580 Other specified disorders of bone density and structure, unspecified site: Secondary | ICD-10-CM

## 2014-12-21 DIAGNOSIS — I10 Essential (primary) hypertension: Secondary | ICD-10-CM | POA: Diagnosis not present

## 2014-12-21 LAB — TSH: TSH: 0.819 u[IU]/mL (ref 0.350–4.500)

## 2014-12-21 LAB — COMPLETE METABOLIC PANEL WITH GFR
ALT: 20 U/L (ref 0–35)
AST: 18 U/L (ref 0–37)
Albumin: 4.3 g/dL (ref 3.5–5.2)
Alkaline Phosphatase: 92 U/L (ref 39–117)
BUN: 15 mg/dL (ref 6–23)
CO2: 22 mEq/L (ref 19–32)
Calcium: 9.8 mg/dL (ref 8.4–10.5)
Chloride: 100 mEq/L (ref 96–112)
Creat: 0.83 mg/dL (ref 0.50–1.10)
GFR, Est African American: 87 mL/min
GFR, Est Non African American: 75 mL/min
Glucose, Bld: 94 mg/dL (ref 70–99)
Potassium: 4.6 mEq/L (ref 3.5–5.3)
Sodium: 138 mEq/L (ref 135–145)
Total Bilirubin: 0.4 mg/dL (ref 0.2–1.2)
Total Protein: 6.8 g/dL (ref 6.0–8.3)

## 2014-12-21 LAB — LIPID PANEL
Cholesterol: 209 mg/dL — ABNORMAL HIGH (ref 0–200)
HDL: 51 mg/dL (ref >=46)
LDL Cholesterol: 134 mg/dL — ABNORMAL HIGH (ref 0–99)
Total CHOL/HDL Ratio: 4.1 Ratio
Triglycerides: 122 mg/dL (ref <150)
VLDL: 24 mg/dL (ref 0–40)

## 2014-12-21 MED ORDER — PANTOPRAZOLE SODIUM 40 MG PO TBEC: 40.0000 mg | DELAYED_RELEASE_TABLET | Freq: Two times a day (BID) | ORAL | Status: DC

## 2014-12-21 MED ORDER — FUROSEMIDE 20 MG PO TABS: 20.0000 mg | ORAL_TABLET | Freq: Every day | ORAL | Status: DC

## 2014-12-21 NOTE — Progress Notes (Signed)
Subjective:    Patient ID: Mia [name], female    DOB: 1951-08-18, 64 y.o.   MRN: [id number]  HPI Follow-up impaired fasting glucose. No increased thirst or urination. Home nurse did an A1C was 6.1. She brought that in with her. She has been working out 3 days a week.   We also got a letter from her insurance company. Flexeril will no longer be covered and we will need to consider switching to something else like tizanidine. Discussed this with her today.  Hypertension- Pt denies chest pain, SOB, dizziness, or heart palpitations.  Taking meds as directed w/o problems.  Denies medication side effects.  She feels better on metoprolol.    Had knee surgery on her right knee since I last saw her.  She was getting home PT and now going to the gym. She is a little bit pressured because she's having a hard time losing weight. She has been using my fitness pal for about 35 days and going to the gym 3 days a week and still has not really lost any weight. Though, she does feel like her clothes are actually feeling a lot better. She does admit she has been under eating further calories recommended by the application. She has been going with her husband to the [place] twice a month for a movement program that does a fair amount of nutrition counseling.   Review of Systems  BP 147/74 mmHg  Pulse 78  Ht 5\' 6"  (1.676 m)  Wt 165 lb (74.844 kg)  BMI 26.64 kg/m2  SpO2 95%    Allergies  Allergen Reactions  . Percocet [Oxycodone-Acetaminophen] Nausea And Vomiting  . Celecoxib Other (See Comments)    Other reaction(s): Other flushed  . Codeine Nausea And Vomiting  . Darvocet [Propoxyphene N-Acetaminophen] Nausea And Vomiting  . Erythromycin Nausea And Vomiting    Nausea and vomiting   . Hydrocodone Nausea And Vomiting  . Ketorolac Tromethamine Nausea And Vomiting  . Nitrofurantoin Nausea And Vomiting  . Oxycodone-Acetaminophen Nausea And Vomiting  . Oxycodone-Acetaminophen Nausea And Vomiting   Nausea and vomiting   . Pregabalin Swelling    Legs swelling   . Tramadol Nausea And Vomiting  . Verapamil Nausea And Vomiting    REACTION: intolerance    Past Medical History  Diagnosis Date  . SVT (supraventricular tachycardia)   . Sleep apnea     a. on cpap.  . Raynaud phenomenon   . Anemia   . Arthritis   . Blood transfusion   . Diabetes mellitus   . GERD (gastroesophageal reflux disease)     gastritis  . Hypertension   . Neuromuscular disorder     sclerosis  . Pseudogout   . Thyroid disease     hypothyroidism  . Hemorrhoids   . Rectal bleeding   . PONV (postoperative nausea and vomiting)   . Memory difficulty 12/14/2013  . Fibromyalgia   . Anal fissure   . Gall stones   . Interstitial cystitis   . Chronic Dyspnea     a. 01/2013 Echo: EF 60-65%, Gr 1 DD, PASP 76mmHg.  . Chest pain     a. 01/2013 MV: EF 59%, no ischemia.    Past Surgical History  Procedure Laterality Date  . Cholecystectomy    . Appendectomy    . Tonsillectomy    . Hysterectomy - unknown type    . Knee surgery      x6  . Oophorectomy    . Dilation and  curettage of uterus    . Breast biopsy    . Shoulder surgery      bilateral- bones spur  . Neck surgery      fusion  . Quadriceps repair Right   . Bladder surgery      x2  . Cataract extraction Bilateral   . Rectocele repair      x2  . Enterocele repair      x2  . Cardiac catheterization    . Eye surgery      retina  . Shoulder arthroscopy with subacromial decompression, rotator cuff repair and bicep tendon repair  10/06/2012    Procedure: SHOULDER ARTHROSCOPY WITH SUBACROMIAL DECOMPRESSION, ROTATOR CUFF REPAIR AND BICEP TENDON REPAIR;  Surgeon: Nita Sells, MD;  Location: [facility];  Service: Orthopedics;  Laterality: Right;  Arthroscopic  Repair  of  Subscapularis, Open Biceps Tenodesis  . Bladder surgery      History   Social History  . Marital Status: Married    Spouse Name: N/A  . Number of  Children: 2  . Years of Education: N/A   Occupational History  . Retired    Social History Main Topics  . Smoking status: Never Smoker   . Smokeless tobacco: Never Used  . Alcohol Use: No  . Drug Use: No  . Sexual Activity: Not on file   Other Topics Concern  . Not on file   Social History Narrative    Family History  Problem Relation Age of Onset  . Heart attack Mother   . Stroke Mother   . Diabetes Mother   . Breast cancer Maternal Grandmother   . Pancreatic cancer Maternal Grandfather   . Heart disease Mother   . Heart disease Father   . Motor neuron disease Sister   . Stroke Sister   . Lupus Sister   . Asthma Sister   . Colon polyps Mother   . Colon polyps Sister   . Heart attack Father   . Irritable bowel syndrome Sister   . Wilson's disease Maternal Grandmother     Outpatient Encounter Prescriptions as of 12/21/2014  Medication Sig  . calcium carbonate (OS-CAL) 600 MG TABS Take 600 mg by mouth daily with breakfast.   . cetirizine (ZYRTEC) 10 MG tablet Take 10 mg by mouth as needed for allergies.  Marland Kitchen diclofenac (VOLTAREN) 75 MG EC tablet Take 1 tablet (75 mg total) by mouth 2 (two) times daily.  . diclofenac sodium (VOLTAREN) 1 % GEL Apply 2 g topically 3 (three) times daily as needed (join pain).   Marland Kitchen donepezil (ARICEPT) 10 MG tablet Take 1 tablet (10 mg total) by mouth at bedtime.  . fluticasone (FLONASE) 50 MCG/ACT nasal spray Place into both nostrils as needed for allergies or rhinitis.  . furosemide (LASIX) 20 MG tablet Take 1 tablet (20 mg total) by mouth daily.  Marland Kitchen leflunomide (ARAVA) 20 MG tablet Take 1 tablet by mouth daily.  . meperidine (DEMEROL) 50 MG tablet Take 50 mg by mouth as needed for severe pain.  . metFORMIN (GLUCOPHAGE) 500 MG tablet Take 1 tablet (500 mg total) by mouth 2 (two) times daily with a meal.  . metoprolol (LOPRESSOR) 50 MG tablet Take 1 tablet (50 mg total) by mouth 2 (two) times daily.  Marland Kitchen morphine (MSIR) 15 MG tablet Take 15-30  mg by mouth every 4 (four) hours as needed for severe pain (1-2 tabs as needed).   . Multiple Vitamin (MULTIVITAMIN) capsule Take 1 capsule  by mouth daily.  . NON FORMULARY Place into the nose at bedtime. cpap machine  . pantoprazole (PROTONIX) 40 MG tablet Take 1 tablet (40 mg total) by mouth 2 (two) times daily.  . propranolol (INDERAL) 10 MG tablet Take 10 mg by mouth 3 (three) times daily as needed.   . SUMAtriptan (IMITREX) 25 MG tablet Take 25 mg by mouth as needed for migraine or headache. May repeat in 2 hours if headache persists or recurs.  . venlafaxine XR (EFFEXOR-XR) 75 MG 24 hr capsule Take 75 mg by mouth daily with breakfast.  . vitamin D, CHOLECALCIFEROL, 400 UNITS tablet Take 400 Units by mouth daily.   . [DISCONTINUED] furosemide (LASIX) 20 MG tablet Take 1 tablet (20 mg total) by mouth daily.  . [DISCONTINUED] pantoprazole (PROTONIX) 40 MG tablet Take 1 tablet (40 mg total) by mouth 2 (two) times daily.  . [DISCONTINUED] cyclobenzaprine (FLEXERIL) 10 MG tablet TAKE 1 TABLET (10 MG TOTAL) BY MOUTH AT BEDTIME.  . [DISCONTINUED] folic acid (FOLVITE) 1 MG tablet Take 1 mg by mouth 2 (two) times daily.   . [DISCONTINUED] silver sulfADIAZINE (SILVADENE) 1 % cream Apply 1 application topically daily.          Objective:   Physical Exam  Constitutional: She is oriented to person, place, and time. She appears well-developed and well-nourished.  HENT:  Head: Normocephalic and atraumatic.  Cardiovascular: Normal rate, regular rhythm and normal heart sounds.   Pulmonary/Chest: Effort normal and breath sounds normal.  Neurological: She is alert and oriented to person, place, and time.  Skin: Skin is warm and dry.  Psychiatric: She has a normal mood and affect. Her behavior is normal.          Assessment & Plan:  Impaired fasting glucose-hemoglobin A1c is up a little bit at 6.1. Discussed diet and exercise changes needed.  HTN - initial blood pressure reading out of range.  They did recently switch her to metoprolol. We may need to adjust the dose to better control her blood pressure. We'll have her recheck her blood pressure in one to 2 weeks and if still elevated then we can increase the Toprol to 100 mg.  She brought in a form from the home health nurse to her insurance company. They did recommend a Prevnar 13, bone density test, hepatitis C screening. We discussed that she is not a candidate for Prevnar 13 and she has not 65 yet. We will do hepatitis C screening. And she says she or he has a diagnosis of osteopenia and her last bone density test was about 3 years ago at [facility]. We will get a new one scheduled for her.  Abnormal weight gain-discussed that she needs to eat adequate calories or it can actually slow down her metabolism if she is aggressively lowering her calories. She's make sure she's getting adequate protein which she feels she is doing especially while working out. We will also check her thyroid as she did have a history of thyroid problems when she was in college that cause some rapid weight gain.  Osteopenia-we'll schedule for DEXA scan.

## 2014-12-22 LAB — HEPATITIS C ANTIBODY: HCV Ab: NEGATIVE

## 2014-12-28 ENCOUNTER — Ambulatory Visit (INDEPENDENT_AMBULATORY_CARE_PROVIDER_SITE_OTHER): Payer: Medicare Other | Admitting: Cardiovascular Disease

## 2014-12-28 ENCOUNTER — Encounter: Payer: Self-pay | Admitting: Cardiovascular Disease

## 2014-12-28 VITALS — BP 120/84 | HR 73 | Ht 66.0 in | Wt 164.1 lb

## 2014-12-28 DIAGNOSIS — E785 Hyperlipidemia, unspecified: Secondary | ICD-10-CM

## 2014-12-28 DIAGNOSIS — R0602 Shortness of breath: Secondary | ICD-10-CM

## 2014-12-28 DIAGNOSIS — R0789 Other chest pain: Secondary | ICD-10-CM

## 2014-12-28 DIAGNOSIS — I471 Supraventricular tachycardia: Secondary | ICD-10-CM

## 2014-12-28 MED ORDER — ATORVASTATIN CALCIUM 40 MG PO TABS: 40.0000 mg | ORAL_TABLET | Freq: Every day | ORAL | Status: DC

## 2014-12-28 NOTE — Patient Instructions (Signed)
Your physician has recommended you make the following change in your medication:  START Atorvastatin 40 mg once daily  Your physician recommends that you return for lab work in: 3 months for FASTING lab work - cholesterol, liver, basic metabolic panel  Your physician wants you to follow-up in: 6 months with Dr. Acie Fredrickson.  You will receive a reminder letter in the mail two months in advance. If you don't receive a letter, please call our office to schedule the follow-up appointment.

## 2014-12-28 NOTE — Progress Notes (Signed)
Cardiology Office Note   Date:  12/28/2014   ID:  Mia [name], DOB 10-05-1951, MRN [id number]  PCP:  [name], MD  Cardiologist:   Thayer Headings, MD   Chief Complaint  Patient presents with  . Follow-up    chest pain    1. SVT 2. Raynaud's Phenomenon 3. Diabetes Mellitus 4. Minimal CAD by cath 2006 [name]) 5. Sleep apnea - CPAP is currently not working  02/11/05 Right heart catheterization:  RA pressure: 8 mmHg  RV pressure: 25/7 mmHg  PA pressure: 20/9 mmHg  PWCP: 9 mmHg  CO: 4.6 liters per minute  LEFT HEART CATHETERIZATION RESULTS:  1. Left main: No significant disease.  2. LAD: Mild luminal irregularities.  3. First and second diagonal: Moderate size, mild luminal irregularities.  4. Left circumflex: Nondominant, mild luminal irregularities.  5. RCA: Dominant with mild luminal irregularities.  6. LV: EF is 55 to 60%, no wall motion abnormalities. LV EDP was 9 mmHg.   December 28, 2014:  Mia [name] is a 64 y.o. female who presents for follow-up with an episode of chest pain. She was seen by our nurse practitioner, Ignacia Bayley.    Diltiazem was DC'd.   She was started on Metoprolol 50 bid.   Myoview study was normal. She has normal left ventricle systolic function. She is going to the [facility] 3 times a week.  Doing well.     She is feeling much better.     Past Medical History  Diagnosis Date  . SVT (supraventricular tachycardia)   . Sleep apnea     a. on cpap.  . Raynaud phenomenon   . Anemia   . Arthritis   . Blood transfusion   . Diabetes mellitus   . GERD (gastroesophageal reflux disease)     gastritis  . Hypertension   . Neuromuscular disorder     sclerosis  . Pseudogout   . Thyroid disease     hypothyroidism  . Hemorrhoids   . Rectal bleeding   . PONV (postoperative nausea and vomiting)   . Memory difficulty 12/14/2013  . Fibromyalgia   . Anal fissure   . Gall stones   . Interstitial cystitis   . Chronic Dyspnea    a. 01/2013 Echo: EF 60-65%, Gr 1 DD, PASP 74mmHg.  . Chest pain     a. 01/2013 MV: EF 59%, no ischemia.    Past Surgical History  Procedure Laterality Date  . Cholecystectomy    . Appendectomy    . Tonsillectomy    . Hysterectomy - unknown type    . Knee surgery      x6  . Oophorectomy    . Dilation and curettage of uterus    . Breast biopsy    . Shoulder surgery      bilateral- bones spur  . Neck surgery      fusion  . Quadriceps repair Right   . Bladder surgery      x2  . Cataract extraction Bilateral   . Rectocele repair      x2  . Enterocele repair      x2  . Cardiac catheterization    . Eye surgery      retina  . Shoulder arthroscopy with subacromial decompression, rotator cuff repair and bicep tendon repair  10/06/2012    Procedure: SHOULDER ARTHROSCOPY WITH SUBACROMIAL DECOMPRESSION, ROTATOR CUFF REPAIR AND BICEP TENDON REPAIR;  Surgeon: Nita Sells, MD;  Location: [facility];  Service: Orthopedics;  Laterality: Right;  Arthroscopic  Repair  of  Subscapularis, Open Biceps Tenodesis  . Bladder surgery       Current Outpatient Prescriptions  Medication Sig Dispense Refill  . calcium carbonate (OS-CAL) 600 MG TABS Take 600 mg by mouth daily with breakfast.     . cetirizine (ZYRTEC) 10 MG tablet Take 10 mg by mouth as needed for allergies.    . Coenzyme Q10 (COQ10) 100 MG CAPS Take 100 mg by mouth 2 (two) times daily.    . diclofenac (VOLTAREN) 75 MG EC tablet Take 1 tablet (75 mg total) by mouth 2 (two) times daily. 180 tablet 1  . diclofenac sodium (VOLTAREN) 1 % GEL Apply 2 g topically 3 (three) times daily as needed (join pain).     Marland Kitchen donepezil (ARICEPT) 10 MG tablet Take 1 tablet (10 mg total) by mouth at bedtime. 30 tablet 3  . fluticasone (FLONASE) 50 MCG/ACT nasal spray Place into both nostrils as needed for allergies or rhinitis.    . furosemide (LASIX) 20 MG tablet Take 1 tablet (20 mg total) by mouth daily. 30 tablet 6  .  leflunomide (ARAVA) 20 MG tablet Take 1 tablet by mouth daily.  2  . Magnesium 250 MG TABS Take 250 mg by mouth 3 (three) times daily.    . meperidine (DEMEROL) 50 MG tablet Take 50 mg by mouth as needed for severe pain.    . metFORMIN (GLUCOPHAGE) 500 MG tablet Take 1 tablet (500 mg total) by mouth 2 (two) times daily with a meal. 60 tablet 11  . metoprolol (LOPRESSOR) 50 MG tablet Take 1 tablet (50 mg total) by mouth 2 (two) times daily. 180 tablet 3  . morphine (MSIR) 15 MG tablet Take 15-30 mg by mouth every 4 (four) hours as needed for severe pain (1-2 tabs as needed).     . NON FORMULARY Place into the nose at bedtime. cpap machine    . pantoprazole (PROTONIX) 40 MG tablet Take 1 tablet (40 mg total) by mouth 2 (two) times daily. 180 tablet 3  . propranolol (INDERAL) 10 MG tablet Take 10 mg by mouth 3 (three) times daily as needed.     . SUMAtriptan (IMITREX) 25 MG tablet Take 25 mg by mouth as needed for migraine or headache. May repeat in 2 hours if headache persists or recurs.    . venlafaxine XR (EFFEXOR-XR) 75 MG 24 hr capsule Take 75 mg by mouth daily with breakfast.    . vitamin D, CHOLECALCIFEROL, 400 UNITS tablet Take 400 Units by mouth daily.      No current facility-administered medications for this visit.    Allergies:   Percocet; Celecoxib; Codeine; Darvocet; Erythromycin; Hydrocodone; Ketorolac tromethamine; Nitrofurantoin; Oxycodone-acetaminophen; Oxycodone-acetaminophen; Pregabalin; Tramadol; and Verapamil    Social History:  The patient  reports that she has never smoked. She has never used smokeless tobacco. She reports that she does not drink alcohol or use illicit drugs.   Family History:  The patient's family history includes Asthma in her sister; Breast cancer in her maternal grandmother; Colon polyps in her mother and sister; Diabetes in her mother; Heart attack in her father and mother; Heart disease in her father and mother; Irritable bowel syndrome in her sister;  Lupus in her sister; Motor neuron disease in her sister; Pancreatic cancer in her maternal grandfather; Stroke in her mother and sister; Wilson's disease in her maternal grandmother.    ROS:  Please see the history of present  illness.    Review of Systems: Constitutional:  denies fever, chills, diaphoresis, appetite change and fatigue.  HEENT: denies photophobia, eye pain, redness, hearing loss, ear pain, congestion, sore throat, rhinorrhea, sneezing, neck pain, neck stiffness and tinnitus.  Respiratory: denies SOB, DOE, cough, chest tightness, and wheezing.  Cardiovascular: denies chest pain, palpitations and leg swelling.  Gastrointestinal: denies nausea, vomiting, abdominal pain, diarrhea, constipation, blood in stool.  Genitourinary: denies dysuria, urgency, frequency, hematuria, flank pain and difficulty urinating.  Musculoskeletal: denies  myalgias, back pain, joint swelling, arthralgias and gait problem.   Skin: denies pallor, rash and wound.  Neurological: denies dizziness, seizures, syncope, weakness, light-headedness, numbness and headaches.   Hematological: denies adenopathy, easy bruising, personal or family bleeding history.  Psychiatric/ Behavioral: denies suicidal ideation, mood changes, confusion, nervousness, sleep disturbance and agitation.       All other systems are reviewed and negative.    PHYSICAL EXAM: VS:  BP 120/84 mmHg  Pulse 73  Ht 5\' 6"  (1.676 m)  Wt 164 lb 1.9 oz (74.444 kg)  BMI 26.50 kg/m2  SpO2 97% , BMI Body mass index is 26.5 kg/(m^2). GEN: Well nourished, well developed, in no acute distress HEENT: normal Neck: no JVD, carotid bruits, or masses Cardiac: RRR; no murmurs, rubs, or gallops,no edema  Respiratory:  clear to auscultation bilaterally, normal work of breathing GI: soft, nontender, nondistended, + BS MS: no deformity or atrophy Skin: warm and dry, no rash Neuro:  Strength and sensation are intact Psych: normal   EKG:  EKG is not  ordered today.     Recent Labs: 07/31/2014: Hemoglobin 13.4; Platelets 447* 12/21/2014: ALT 20; BUN 15; Creatinine 0.83; Potassium 4.6; Sodium 138; TSH 0.819    Lipid Panel    Component Value Date/Time   CHOL 209* 12/21/2014 1038   TRIG 122 12/21/2014 1038   HDL 51 12/21/2014 1038   CHOLHDL 4.1 12/21/2014 1038   VLDL 24 12/21/2014 1038   LDLCALC 134* 12/21/2014 1038      Wt Readings from Last 3 Encounters:  12/28/14 164 lb 1.9 oz (74.444 kg)  12/21/14 165 lb (74.844 kg)  11/29/14 161 lb (73.029 kg)      Other studies Reviewed: Additional studies/ records that were reviewed today include: . Review of the above records demonstrates:    ASSESSMENT AND PLAN:  1. SVT - seems to be fairly well-controlled and she does have episodes of sinus tachycardia but this seems 3 separate from her SVT.  2. Raynaud's Phenomenon - she's had a tough time with Raynaud's phenomenon this past winter. She's now on metoprolol. We will need to watch her closely for any worsening of her Raynaud's.  3. Diabetes Mellitus  4. Minimal CAD by cath 2006 [name])  5. Sleep apnea - CPAP is currently not working  6. Dyspnea on exertion:   she continues to have dyspnea on exertion. Her echocardiogram and her Myoview study were normal.  Current medicines are reviewed at length with the patient today.  The patient does not have concerns regarding medicines.  The following changes have been made:  no change   Disposition:   FU with me in 6 months     Signed, Willella Harding, Wonda Cheng, MD  12/28/2014 10:00 AM    [facility] [place], [place], [place]  [address] Phone: [phone]; Fax: [phone]

## 2014-12-31 DIAGNOSIS — E119 Type 2 diabetes mellitus without complications: Secondary | ICD-10-CM | POA: Diagnosis not present

## 2014-12-31 DIAGNOSIS — G4733 Obstructive sleep apnea (adult) (pediatric): Secondary | ICD-10-CM | POA: Diagnosis not present

## 2014-12-31 DIAGNOSIS — H35371 Puckering of macula, right eye: Secondary | ICD-10-CM | POA: Diagnosis not present

## 2014-12-31 DIAGNOSIS — H33101 Unspecified retinoschisis, right eye: Secondary | ICD-10-CM | POA: Diagnosis not present

## 2014-12-31 LAB — HM DIABETES EYE EXAM

## 2015-01-01 DIAGNOSIS — Z79899 Other long term (current) drug therapy: Secondary | ICD-10-CM | POA: Diagnosis not present

## 2015-01-14 DIAGNOSIS — M25561 Pain in right knee: Secondary | ICD-10-CM | POA: Diagnosis not present

## 2015-01-14 DIAGNOSIS — Z96651 Presence of right artificial knee joint: Secondary | ICD-10-CM | POA: Diagnosis not present

## 2015-01-14 DIAGNOSIS — M7061 Trochanteric bursitis, right hip: Secondary | ICD-10-CM | POA: Diagnosis not present

## 2015-01-15 ENCOUNTER — Telehealth: Payer: Self-pay | Admitting: *Deleted

## 2015-01-15 DIAGNOSIS — Z79899 Other long term (current) drug therapy: Secondary | ICD-10-CM | POA: Diagnosis not present

## 2015-01-15 MED ORDER — SUCRALFATE 1 GM/10ML PO SUSP: 1.0000 g | Freq: Three times a day (TID) | ORAL | Status: DC

## 2015-01-15 NOTE — Telephone Encounter (Signed)
I will send over a script for carafate to coat the stomach before eating.  Make sure eating smaller amounts at one time but throught the day as well.

## 2015-01-15 NOTE — Telephone Encounter (Signed)
Pt states that she has had a lot of N/V and wanted to know if Dr. Madilyn Fireman has any suggestions of what she can do. I asked if she has been eating a bland diet. She said that she has been eating lighter. She has not had any episodes of vomiting since Saturday, she has been taking 2 of the pantprozole hoping this would give her some relief. She reports a hx of gastritis and said that she has been having pain in her jaw like she did when she had gastritis. Will fwd to pcp for advice.Mia [name]

## 2015-01-16 NOTE — Telephone Encounter (Signed)
lvm w/recommendations. Pt told to rtn call if any questions.Mia [name]

## 2015-01-21 ENCOUNTER — Telehealth: Payer: Self-pay | Admitting: *Deleted

## 2015-01-21 NOTE — Telephone Encounter (Signed)
We can call the pharmacy and see if the tabs are cheaper than the liquid.  Also make sure has stopped the diclofenac.

## 2015-01-21 NOTE — Telephone Encounter (Signed)
Patient called & stated that the gastritis med that was called in cost $109  & it's not affordable to her & her husband. She wants to know if something else a little cheaper could be called in & also  That she is now having diarrhea also.

## 2015-01-26 ENCOUNTER — Other Ambulatory Visit: Payer: Self-pay | Admitting: Neurology

## 2015-01-28 DIAGNOSIS — Z96651 Presence of right artificial knee joint: Secondary | ICD-10-CM | POA: Diagnosis not present

## 2015-01-28 DIAGNOSIS — M7061 Trochanteric bursitis, right hip: Secondary | ICD-10-CM | POA: Diagnosis not present

## 2015-01-28 DIAGNOSIS — M7071 Other bursitis of hip, right hip: Secondary | ICD-10-CM | POA: Diagnosis not present

## 2015-01-28 DIAGNOSIS — M7072 Other bursitis of hip, left hip: Secondary | ICD-10-CM | POA: Diagnosis not present

## 2015-01-31 DIAGNOSIS — G4733 Obstructive sleep apnea (adult) (pediatric): Secondary | ICD-10-CM | POA: Diagnosis not present

## 2015-02-04 DIAGNOSIS — M25571 Pain in right ankle and joints of right foot: Secondary | ICD-10-CM | POA: Diagnosis not present

## 2015-02-04 DIAGNOSIS — M069 Rheumatoid arthritis, unspecified: Secondary | ICD-10-CM | POA: Diagnosis not present

## 2015-02-04 DIAGNOSIS — M797 Fibromyalgia: Secondary | ICD-10-CM | POA: Diagnosis not present

## 2015-02-04 DIAGNOSIS — I7300 Raynaud's syndrome without gangrene: Secondary | ICD-10-CM | POA: Diagnosis not present

## 2015-02-07 ENCOUNTER — Telehealth: Payer: Self-pay | Admitting: Internal Medicine

## 2015-02-07 NOTE — Telephone Encounter (Signed)
Left message for pt to call back.  Pt states she is having terrible epigastric pain in her sternum area and a gnawing pain in her stomach. Pt states she has not been able to sleep due to the burning and discomfort. Pt states she is not taking her diclofenac anymore, states she never picked up the carafate due to the cost. Pt scheduled to see [name] Hvozdovic, PA-C tomorrow at 2:15pm. Pt aware.

## 2015-02-08 ENCOUNTER — Other Ambulatory Visit (INDEPENDENT_AMBULATORY_CARE_PROVIDER_SITE_OTHER): Payer: Medicare Other

## 2015-02-08 ENCOUNTER — Telehealth: Payer: Self-pay | Admitting: Cardiovascular Disease

## 2015-02-08 ENCOUNTER — Ambulatory Visit (INDEPENDENT_AMBULATORY_CARE_PROVIDER_SITE_OTHER): Payer: Medicare Other | Admitting: Physician Assistant

## 2015-02-08 ENCOUNTER — Encounter: Payer: Self-pay | Admitting: Physician Assistant

## 2015-02-08 VITALS — BP 134/78 | HR 68 | Ht 65.5 in | Wt 162.4 lb

## 2015-02-08 DIAGNOSIS — R0789 Other chest pain: Secondary | ICD-10-CM

## 2015-02-08 DIAGNOSIS — E785 Hyperlipidemia, unspecified: Secondary | ICD-10-CM

## 2015-02-08 DIAGNOSIS — K219 Gastro-esophageal reflux disease without esophagitis: Secondary | ICD-10-CM

## 2015-02-08 DIAGNOSIS — R1013 Epigastric pain: Secondary | ICD-10-CM

## 2015-02-08 DIAGNOSIS — R197 Diarrhea, unspecified: Secondary | ICD-10-CM

## 2015-02-08 LAB — HEPATIC FUNCTION PANEL
ALT: 28 U/L (ref 0–35)
AST: 24 U/L (ref 0–37)
Albumin: 4.1 g/dL (ref 3.5–5.2)
Alkaline Phosphatase: 103 U/L (ref 39–117)
Bilirubin, Direct: 0.1 mg/dL (ref 0.0–0.3)
Total Bilirubin: 0.3 mg/dL (ref 0.2–1.2)
Total Protein: 7.2 g/dL (ref 6.0–8.3)

## 2015-02-08 LAB — COMPREHENSIVE METABOLIC PANEL
ALT: 28 U/L (ref 0–35)
AST: 24 U/L (ref 0–37)
Albumin: 4.1 g/dL (ref 3.5–5.2)
Alkaline Phosphatase: 103 U/L (ref 39–117)
BUN: 13 mg/dL (ref 6–23)
CO2: 29 mEq/L (ref 19–32)
Calcium: 9.8 mg/dL (ref 8.4–10.5)
Chloride: 102 mEq/L (ref 96–112)
Creatinine, Ser: 0.78 mg/dL (ref 0.40–1.20)
GFR: 79.17 mL/min (ref >60.00)
Glucose, Bld: 105 mg/dL — ABNORMAL HIGH (ref 70–99)
Potassium: 4 mEq/L (ref 3.5–5.1)
Sodium: 136 mEq/L (ref 135–145)
Total Bilirubin: 0.3 mg/dL (ref 0.2–1.2)
Total Protein: 7.2 g/dL (ref 6.0–8.3)

## 2015-02-08 LAB — LIPID PANEL
Cholesterol: 101 mg/dL (ref 0–200)
HDL: 47.1 mg/dL (ref >39.00)
LDL Cholesterol: 43 mg/dL (ref 0–99)
NonHDL: 53.9
Total CHOL/HDL Ratio: 2
Triglycerides: 53 mg/dL (ref 0.0–149.0)
VLDL: 10.6 mg/dL (ref 0.0–40.0)

## 2015-02-08 LAB — BASIC METABOLIC PANEL
BUN: 13 mg/dL (ref 6–23)
CO2: 29 mEq/L (ref 19–32)
Calcium: 9.8 mg/dL (ref 8.4–10.5)
Chloride: 102 mEq/L (ref 96–112)
Creatinine, Ser: 0.78 mg/dL (ref 0.40–1.20)
GFR: 79.17 mL/min (ref >60.00)
Glucose, Bld: 105 mg/dL — ABNORMAL HIGH (ref 70–99)
Potassium: 4 mEq/L (ref 3.5–5.1)
Sodium: 136 mEq/L (ref 135–145)

## 2015-02-08 LAB — LIPASE: Lipase: 28 U/L (ref 11.0–59.0)

## 2015-02-08 MED ORDER — SUCRALFATE 1 G PO TABS: 1.0000 g | ORAL_TABLET | Freq: Three times a day (TID) | ORAL | Status: DC

## 2015-02-08 NOTE — Patient Instructions (Addendum)
Food Choices for Gastroesophageal Reflux Disease When you have gastroesophageal reflux disease (GERD), the foods you eat and your eating habits are very important. Choosing the right foods can help ease the discomfort of GERD. WHAT GENERAL GUIDELINES DO I NEED TO FOLLOW?  Choose fruits, vegetables, whole grains, low-fat dairy products, and low-fat meat, fish, and poultry.  Limit fats such as oils, salad dressings, butter, nuts, and avocado.  Keep a food diary to identify foods that cause symptoms.  Avoid foods that cause reflux. These may be different for different people.  Eat frequent small meals instead of three large meals each day.  Eat your meals slowly, in a relaxed setting.  Limit fried foods.  Cook foods using methods other than frying.  Avoid drinking alcohol.  Avoid drinking large amounts of liquids with your meals.  Avoid bending over or lying down until 2-3 hours after eating. WHAT FOODS ARE NOT RECOMMENDED? The following are some foods and drinks that may worsen your symptoms: Vegetables Tomatoes. Tomato juice. Tomato and spaghetti sauce. Chili peppers. Onion and garlic. Horseradish. Fruits Oranges, grapefruit, and lemon (fruit and juice). Meats High-fat meats, fish, and poultry. This includes hot dogs, ribs, ham, sausage, salami, and bacon. Dairy Whole milk and chocolate milk. Sour cream. Cream. Butter. Ice cream. Cream cheese.  Beverages Coffee and tea, with or without caffeine. Carbonated beverages or energy drinks. Condiments Hot sauce. Barbecue sauce.  Sweets/Desserts Chocolate and cocoa. Donuts. Peppermint and spearmint. Fats and Oils High-fat foods, including [place] fries and potato chips. Other Vinegar. Strong spices, such as black pepper, white pepper, red pepper, cayenne, curry powder, cloves, ginger, and chili powder. The items listed above may not be a complete list of foods and beverages to avoid. Contact your dietitian for more  information. Document Released: 10/12/2005 Document Revised: 10/17/2013 Document Reviewed: 08/16/2013 [facility] Patient Information 2015 [place], [place]. This information is not intended to replace advice given to you by your health care provider. Make sure you discuss any questions you have with your health care provider.  Please continue taking Pantoprazole   We have sent the following medications to your pharmacy for you to pick up at your convenience: Sucralfate  Your physician has requested that you go to the basement for lab work before leaving today  Please follow up with Dr. Acie Fredrickson regarding jaw pain and notify us with any updates  Elevate the head of the bed at night

## 2015-02-08 NOTE — Telephone Encounter (Signed)
Lori Hvozdovic, Pa from [facility] called in regards to pt. Pt told [name] that for several weeks now she has been having jaw pain and some pain under her tongue that has caused her to wake up gasping for air at night. Pt has no complaints of chest pain. Pt needs upper endoscopy done but this is not scheduled yet. [name] wants to know if pt is ok to have endoscopy done or if Dr. Acie Fredrickson needed to do any testing prior to scheduling this procedure? Will forward to Dr. Acie Fredrickson for review and advisement.

## 2015-02-08 NOTE — Telephone Encounter (Signed)
Left message to call back  

## 2015-02-08 NOTE — Progress Notes (Signed)
Patient ID: Mia [name], female   DOB: 1950-10-29, 64 y.o.   MRN: [id number]     History of Present Illness: Mia [name] this delightful 64 year old female known to Dr. He with diverticular disease, GERD, and chronic constipation. Her past medical history significant for SVT, sleep apnea, Raynaud phenomena, anemia, diabetes, GERD, hypertension, neuromuscular disorder, pseudogout, hypothyroidism, fibromyalgia, interstitial cystitis.  She is here today with complaints of 2 months of epigastric pain. Her pain is worse on an empty stomach and tends to be worse at night. She states she spoke to her primary care provider and Carafate was called in but she couldn't afford it so she never filled it. She then spoke to her primary care office again and was told to increase her pantoprazole to twice a day to discontinue her diclofenac. It is a constant gnawing type pain in the epigastric area and states she gets pain radiating up her chest. She last had an EGD in 2008 by Dr. Earlean Shawl that revealed antritis. She states she also has a history of ulcers. She reports that she has had several episodes over the past 5 or 6 weeks where she wakes up at night with pain in her neck, jaw and under her tongue gasping for air and clammy. She has felt nauseous with days and has had to go and standard narrow window social can get some air. Review of her chart shows that she was evaluated at heart care in January 2016 with orthopnea and 2 episodes of chest pain. She has a history of chronic dyspnea and SVT. Her SVT had been managed on a calcium channel blocker but she was unable to afford it and was changed to metoprolol. She had a stress test in April 2014 which was nonischemic. She was sent for [facility] that was normal. She was evaluated by Dr. Acie Fredrickson on March 4 at which time she reported that she was feeling well. She did not report to him that she had had episodes of neck pain, pain under her tongue and orthopnea that  awakened her from sleep. She has also been having watery diarrhea 4-5 times per day for almost a week. She has had no bright red blood per rectum or melena.   Past Medical History  Diagnosis Date  . SVT (supraventricular tachycardia)   . Sleep apnea     a. on cpap.  . Raynaud phenomenon   . Anemia   . Arthritis   . Blood transfusion   . Diabetes mellitus   . GERD (gastroesophageal reflux disease)     gastritis  . Hypertension   . Neuromuscular disorder     sclerosis  . Pseudogout   . Thyroid disease     hypothyroidism  . Hemorrhoids   . Rectal bleeding   . PONV (postoperative nausea and vomiting)   . Memory difficulty 12/14/2013  . Fibromyalgia   . Anal fissure   . Gall stones   . Interstitial cystitis   . Chronic Dyspnea     a. 01/2013 Echo: EF 60-65%, Gr 1 DD, PASP 74mmHg.  . Chest pain     a. 01/2013 MV: EF 59%, no ischemia.  . Peptic ulcer     Past Surgical History  Procedure Laterality Date  . Cholecystectomy    . Appendectomy    . Tonsillectomy    . Hysterectomy - unknown type    . Knee surgery      x6  . Oophorectomy    . Dilation and  curettage of uterus    . Breast biopsy    . Shoulder surgery      bilateral- bones spur  . Neck surgery      fusion  . Quadriceps repair Right   . Bladder surgery      x2  . Cataract extraction Bilateral   . Rectocele repair      x2  . Enterocele repair      x2  . Cardiac catheterization    . Eye surgery      retina  . Shoulder arthroscopy with subacromial decompression, rotator cuff repair and bicep tendon repair  10/06/2012    Procedure: SHOULDER ARTHROSCOPY WITH SUBACROMIAL DECOMPRESSION, ROTATOR CUFF REPAIR AND BICEP TENDON REPAIR;  Surgeon: Nita Sells, MD;  Location: [facility];  Service: Orthopedics;  Laterality: Right;  Arthroscopic  Repair  of  Subscapularis, Open Biceps Tenodesis  . Bladder surgery     Family History  Problem Relation Age of Onset  . Heart attack Mother   .  Stroke Mother   . Diabetes Mother   . Breast cancer Maternal Grandmother   . Pancreatic cancer Maternal Grandfather   . Heart disease Mother   . Heart disease Father   . Motor neuron disease Sister   . Stroke Sister   . Lupus Sister   . Asthma Sister   . Colon polyps Mother   . Colon polyps Sister   . Heart attack Father   . Irritable bowel syndrome Sister   . Wilson's disease Maternal Grandmother    History  Substance Use Topics  . Smoking status: Never Smoker   . Smokeless tobacco: Never Used  . Alcohol Use: No   Current Outpatient Prescriptions  Medication Sig Dispense Refill  . atorvastatin (LIPITOR) 40 MG tablet Take 1 tablet (40 mg total) by mouth daily. 31 tablet 11  . calcium carbonate (OS-CAL) 600 MG TABS Take 600 mg by mouth daily with breakfast.     . cetirizine (ZYRTEC) 10 MG tablet Take 10 mg by mouth as needed for allergies.    Marland Kitchen diclofenac sodium (VOLTAREN) 1 % GEL Apply 2 g topically 3 (three) times daily as needed (join pain).     Marland Kitchen donepezil (ARICEPT) 10 MG tablet Take 1 tablet (10 mg total) by mouth at bedtime. 30 tablet 3  . fluticasone (FLONASE) 50 MCG/ACT nasal spray Place into both nostrils as needed for allergies or rhinitis.    . furosemide (LASIX) 20 MG tablet Take 1 tablet (20 mg total) by mouth daily. 30 tablet 6  . leflunomide (ARAVA) 20 MG tablet Take 1 tablet by mouth daily.  2  . Magnesium 250 MG TABS Take 250 mg by mouth 3 (three) times daily.    . meperidine (DEMEROL) 50 MG tablet Take 50 mg by mouth as needed for severe pain.    . metFORMIN (GLUCOPHAGE) 500 MG tablet Take 1 tablet (500 mg total) by mouth 2 (two) times daily with a meal. 60 tablet 11  . metoprolol (LOPRESSOR) 50 MG tablet Take 1 tablet (50 mg total) by mouth 2 (two) times daily. 180 tablet 3  . morphine (MSIR) 15 MG tablet Take 15-30 mg by mouth every 4 (four) hours as needed for severe pain (1-2 tabs as needed).     . NON FORMULARY Place into the nose at bedtime. cpap machine      . pantoprazole (PROTONIX) 40 MG tablet Take 1 tablet (40 mg total) by mouth 2 (two) times daily. [place]  tablet 3  . propranolol (INDERAL) 10 MG tablet Take 10 mg by mouth 3 (three) times daily as needed.     . SUMAtriptan (IMITREX) 25 MG tablet Take 25 mg by mouth as needed for migraine or headache. May repeat in 2 hours if headache persists or recurs.    . venlafaxine XR (EFFEXOR-XR) 75 MG 24 hr capsule TAKE 1 CAPSULE (75 MG TOTAL) BY MOUTH DAILY WITH BREAKFAST. 90 capsule 1  . vitamin D, CHOLECALCIFEROL, 400 UNITS tablet Take 400 Units by mouth daily.     . sucralfate (CARAFATE) 1 G tablet Take 1 tablet (1 g total) by mouth 4 (four) times daily -  with meals and at bedtime. 120 tablet 1   No current facility-administered medications for this visit.   Allergies  Allergen Reactions  . Percocet [Oxycodone-Acetaminophen] Nausea And Vomiting  . Celecoxib Other (See Comments)    Other reaction(s): Other flushed  . Codeine Nausea And Vomiting  . Darvocet [Propoxyphene N-Acetaminophen] Nausea And Vomiting  . Erythromycin Nausea And Vomiting    Nausea and vomiting   . Hydrocodone Nausea And Vomiting  . Ketorolac Tromethamine Nausea And Vomiting  . Nitrofurantoin Nausea And Vomiting  . Oxycodone-Acetaminophen Nausea And Vomiting  . Oxycodone-Acetaminophen Nausea And Vomiting    Nausea and vomiting   . Pregabalin Swelling    Legs swelling   . Tramadol Nausea And Vomiting  . Verapamil Nausea And Vomiting    REACTION: intolerance      Review of Systems: Per history of present illness, otherwise negative.   Physical Exam: General: Pleasant, well developed female in no acute distress Head: Normocephalic and atraumatic Eyes:  sclerae anicteric, conjunctiva pink  Ears: Normal auditory acuity Lungs: Clear throughout to auscultation Heart: Regular rate and rhythm Abdomen: Soft, non distended, non-tender. No masses, no hepatomegaly. Normal bowel sounds Musculoskeletal: Symmetrical with  no gross deformities  Extremities: No edema  Neurological: Alert oriented x 4, grossly nonfocal Psychological:  Alert and cooperative. Normal mood and affect  Assessment and Recommendations: 64 year old female with a known history of GERD and antritis now with a 2 month history of epigastric pain and increasing reflux here for follow-up. In anti-reflux regimen has been reviewed and she has been instructed to continue pantoprazole 40 mg twice a day 30 minutes before breakfast and 30 minutes before supper. She will try sulcralfate 1 g one tablet before meals and at bedtime. If her insurance does not cover sulcralfate we can consider a trial of Gaviscon. Due to her complaints of nocturnal midsternal chest pain associated with neck and jaw pain and orthopnea, I have talked to the triage nurse at Dr. Elmarie Shiley. And patient will be scheduled for an appointment there. Her discomfort does not appear clearly related to GERD. If cardiology feels she is stable, we will proceed with scheduling her for an EGD. In the meantime she's been instructed to elevate the head of her bed, and should not eat or drink anything for 2 hours before retiring. A stool H. pylori antigen will be obtained. A stool for C. difficile will be obtained as well and if positive she will be treated with a course of Flagyl. Patient has been instructed to call us after she has been contacted by cardiology.        Darvis Croft, Deloris Ping 02/08/2015,  Cc:Dr Nahser

## 2015-02-08 NOTE — Telephone Encounter (Signed)
She has had these chest pains for a long time Normal cath in 2006.  Normal myoview in Feb. 2016 I do not think that these need any additional work up

## 2015-02-11 ENCOUNTER — Other Ambulatory Visit: Payer: Self-pay | Admitting: Physician Assistant

## 2015-02-11 ENCOUNTER — Other Ambulatory Visit: Payer: Medicare Other

## 2015-02-11 ENCOUNTER — Telehealth: Payer: Self-pay | Admitting: Physician Assistant

## 2015-02-11 DIAGNOSIS — K219 Gastro-esophageal reflux disease without esophagitis: Secondary | ICD-10-CM

## 2015-02-11 DIAGNOSIS — R197 Diarrhea, unspecified: Secondary | ICD-10-CM | POA: Diagnosis not present

## 2015-02-11 DIAGNOSIS — R1013 Epigastric pain: Secondary | ICD-10-CM

## 2015-02-11 NOTE — Telephone Encounter (Signed)
Reviewed Dr. Nahser's advice with patient who verbalized understanding and agreement 

## 2015-02-11 NOTE — Telephone Encounter (Signed)
Mia [name], is it ok to schedule patient for EGD?

## 2015-02-11 NOTE — Telephone Encounter (Signed)
Dr. Henrene Pastor, Please see office note. Cardiology feels she is ok to go ahead with EGD. Wanted you to be aware of pt. Mia [name]

## 2015-02-12 ENCOUNTER — Other Ambulatory Visit: Payer: Self-pay | Admitting: *Deleted

## 2015-02-12 LAB — HELICOBACTER PYLORI  SPECIAL ANTIGEN: H. PYLORI Antigen: NEGATIVE

## 2015-02-12 LAB — CLOSTRIDIUM DIFFICILE BY PCR: Toxigenic C. Difficile by PCR: DETECTED — CR

## 2015-02-12 MED ORDER — SACCHAROMYCES BOULARDII 250 MG PO CAPS
ORAL_CAPSULE | ORAL | Status: DC
Start: 2015-02-12 — End: 2015-04-19

## 2015-02-12 MED ORDER — METRONIDAZOLE 500 MG PO TABS: ORAL_TABLET | ORAL | Status: DC

## 2015-02-12 NOTE — Telephone Encounter (Signed)
Office visit reviewed. Okay to schedule EGD since she has been cleared by cardiology. Thank you

## 2015-02-12 NOTE — Progress Notes (Signed)
Agree with initial assessment and plans as outlined 

## 2015-02-12 NOTE — Telephone Encounter (Signed)
Spoke with patient and scheduled Prop EGD on 02/18/15 at 2:30 PM and pre visit on 02/13/15 at 2:00 PM.

## 2015-02-12 NOTE — Telephone Encounter (Signed)
Mia [name], see note--per Dr Henrene Pastor ok to schedule EGD. [name]

## 2015-02-12 NOTE — Telephone Encounter (Signed)
Left a message for patient to call to schedule EGD.

## 2015-02-15 ENCOUNTER — Other Ambulatory Visit: Payer: Self-pay | Admitting: Adult Health

## 2015-02-15 ENCOUNTER — Telehealth: Payer: Self-pay | Admitting: Internal Medicine

## 2015-02-15 NOTE — Telephone Encounter (Signed)
Patient given recommendations. 

## 2015-02-15 NOTE — Telephone Encounter (Signed)
Patient is calling to cancel appointment on 02/19/15 since she has already seen [employer], PA-C. She is taking her Flagyl. She is still having sweats, pain and diarrhea (especially at night). She has been taken off her medication(lowers her immune system) for arthritis until she is clear of c. Diff. She is asking if she has to be tested to be sure the c. Diff is gone before she restarts the medication for arthritis. Please, advise.

## 2015-02-15 NOTE — Telephone Encounter (Signed)
Need to see how she does clinically. If diarrhea not improving, needs to be seen. If diarrhea improves but comes back shortly thereafter, will need sample for c diff. If her pain is severe or if she has a fever on antibiotics--ER.

## 2015-02-18 ENCOUNTER — Telehealth: Payer: Self-pay | Admitting: Internal Medicine

## 2015-02-18 ENCOUNTER — Encounter: Payer: Medicare Other | Admitting: Internal Medicine

## 2015-02-18 NOTE — Telephone Encounter (Signed)
Have her finish flagyl, stay on florastor, dairy free low fat diet. If hs has more diarrhea will need to recheck stool for c diff.

## 2015-02-18 NOTE — Telephone Encounter (Signed)
Patient calling with update on her condition. She states the diarrhea is better but she had it again today. She completes her medication tomorrow AM. Still has some abdominal pain. Scheduled with Arta Bruce, PA-C on 02/21/15 at 2:45 PM to evaluate prior to scheduling colonoscopy.

## 2015-02-18 NOTE — Telephone Encounter (Signed)
Patient notified of recommendations. She states she is having burning in her stomach. She is taking Carafate as recommended but continues to have burning.

## 2015-02-18 NOTE — Telephone Encounter (Signed)
Spoke with patient and she is eating with Flagyl.

## 2015-02-18 NOTE — Telephone Encounter (Signed)
Is she eating a little something with each dose of flagyl?

## 2015-02-19 ENCOUNTER — Ambulatory Visit: Payer: Medicare Other | Admitting: Internal Medicine

## 2015-02-21 ENCOUNTER — Ambulatory Visit (INDEPENDENT_AMBULATORY_CARE_PROVIDER_SITE_OTHER): Payer: Medicare Other | Admitting: Physician Assistant

## 2015-02-21 ENCOUNTER — Encounter: Payer: Self-pay | Admitting: Physician Assistant

## 2015-02-21 VITALS — BP 124/84 | HR 66 | Ht 65.5 in | Wt 161.0 lb

## 2015-02-21 DIAGNOSIS — A047 Enterocolitis due to Clostridium difficile: Secondary | ICD-10-CM | POA: Diagnosis not present

## 2015-02-21 DIAGNOSIS — R1013 Epigastric pain: Secondary | ICD-10-CM | POA: Diagnosis not present

## 2015-02-21 DIAGNOSIS — A0472 Enterocolitis due to Clostridium difficile, not specified as recurrent: Secondary | ICD-10-CM

## 2015-02-21 MED ORDER — SACCHAROMYCES BOULARDII 250 MG PO CAPS
ORAL_CAPSULE | ORAL | Status: DC
Start: 2015-02-21 — End: 2015-02-22

## 2015-02-21 MED ORDER — METRONIDAZOLE 500 MG PO TABS: 500.0000 mg | ORAL_TABLET | Freq: Three times a day (TID) | ORAL | Status: DC

## 2015-02-21 NOTE — Patient Instructions (Signed)
You have been scheduled for an endoscopy. Please follow written instructions given to you at your visit today. If you use inhalers (even only as needed), please bring them with you on the day of your procedure. Your physician has requested that you go to www.startemmi.com and enter the access code given to you at your visit today. This web site gives a general overview about your procedure. However, you should still follow specific instructions given to you by our office regarding your preparation for the procedure.   Your prescription has been sent to your pharmacy Continue pantoprazole Florastor 250 mg use 2 capsules twice a day for 28 days

## 2015-02-21 NOTE — Progress Notes (Signed)
Patient ID: Mia [name], female   DOB: May 14, 1951, 64 y.o.   MRN: [id number]     History of Present Illness: Mia [name] is a delightful 64 year old female known to DrPerry with diverticular disease, GERD, and chronic constipation. Her past medical history is significant for SVT, sleep apnea, Raynaud's phenomena, anemia, diabetes, GERD, hypertension, neuromuscular disorder, pseudogout, hypothyroidism, fibromyalgia, and interstitial cystitis. She was seen here on April 15 with a 2 month history of epigastric pain. Her pain is worse on an empty stomach and tends to be worse at night. She wakes up in the middle of the night with a gnawing epigastric pain relieved temporarily with ingestion of food. She last had an EGD in 2008 by Dr. Earlean Shawl that revealed antritis. She states she also has a history of ulcer. At her last visit she reported that she had recently had several episodes where she woke up at night with pain in her neck, jaw, and under her tongue, gasping for air and feeling clammy she was evaluated at heart care in January 2016 with orthopnea and 2 episodes of chest pain. She has a history of chronic dyspnea and SVT, and her SVT has been managed on a calcium channel blocker, but she was unable to afford it was changed to metoprolol she had a stress test in April 2014 which was non-ischemic. She had a Myoview that was normal. She was evaluated by Dr. Acie Fredrickson on March 4 at which time she reported she was feeling well. At her last visit she reported to [place] that she was having neck pain, pain under her tongue and orthopnea. Dr. Dub Mikes or was contacted and felt that the patient has had these symptoms intermittently and she was cleared to undergo an endoscopy. At her last visit she was also complaining of watery diarrhea 4-5 times per day of a weeks' duration. She was stent for a stool for C. difficile which was positive and she was given a 10 day course of Flagyl. She has 2 more doses of Flagyl to take. Her stools  have been formed for the last few days and she had a soft formed bowel movement today. She continues to have epigastric pain and is here today to schedule her EGD as it had to be canceled when she was found to be C. difficile positive.   Past Medical History  Diagnosis Date  . SVT (supraventricular tachycardia)   . Sleep apnea     a. on cpap.  . Raynaud phenomenon   . Anemia   . Arthritis   . Blood transfusion   . Diabetes mellitus   . GERD (gastroesophageal reflux disease)     gastritis  . Hypertension   . Neuromuscular disorder     sclerosis  . Pseudogout   . Thyroid disease     hypothyroidism  . Hemorrhoids   . Rectal bleeding   . PONV (postoperative nausea and vomiting)   . Memory difficulty 12/14/2013  . Fibromyalgia   . Anal fissure   . Gall stones   . Interstitial cystitis   . Chronic Dyspnea     a. 01/2013 Echo: EF 60-65%, Gr 1 DD, PASP 65mmHg.  . Chest pain     a. 01/2013 MV: EF 59%, no ischemia.  . Peptic ulcer     Past Surgical History  Procedure Laterality Date  . Cholecystectomy    . Appendectomy    . Tonsillectomy    . Hysterectomy - unknown type    . Knee surgery  x6  . Oophorectomy    . Dilation and curettage of uterus    . Breast biopsy    . Shoulder surgery      bilateral- bones spur  . Neck surgery      fusion  . Quadriceps repair Right   . Bladder surgery      x2  . Cataract extraction Bilateral   . Rectocele repair      x2  . Enterocele repair      x2  . Cardiac catheterization    . Eye surgery      retina  . Shoulder arthroscopy with subacromial decompression, rotator cuff repair and bicep tendon repair  10/06/2012    Procedure: SHOULDER ARTHROSCOPY WITH SUBACROMIAL DECOMPRESSION, ROTATOR CUFF REPAIR AND BICEP TENDON REPAIR;  Surgeon: Nita Sells, MD;  Location: [facility];  Service: Orthopedics;  Laterality: Right;  Arthroscopic  Repair  of  Subscapularis, Open Biceps Tenodesis  . Bladder surgery      Family History  Problem Relation Age of Onset  . Heart attack Mother   . Stroke Mother   . Diabetes Mother   . Breast cancer Maternal Grandmother   . Pancreatic cancer Maternal Grandfather   . Heart disease Mother   . Heart disease Father   . Motor neuron disease Sister   . Stroke Sister   . Lupus Sister   . Asthma Sister   . Colon polyps Mother   . Colon polyps Sister   . Heart attack Father   . Irritable bowel syndrome Sister   . Wilson's disease Maternal Grandmother    History  Substance Use Topics  . Smoking status: Never Smoker   . Smokeless tobacco: Never Used  . Alcohol Use: No   Current Outpatient Prescriptions  Medication Sig Dispense Refill  . atorvastatin (LIPITOR) 40 MG tablet Take 1 tablet (40 mg total) by mouth daily. 31 tablet 11  . calcium carbonate (OS-CAL) 600 MG TABS Take 600 mg by mouth daily with breakfast.     . cetirizine (ZYRTEC) 10 MG tablet Take 10 mg by mouth as needed for allergies.    Marland Kitchen diclofenac sodium (VOLTAREN) 1 % GEL Apply 2 g topically 3 (three) times daily as needed (join pain).     Marland Kitchen donepezil (ARICEPT) 10 MG tablet TAKE 1 TABLET (10 MG TOTAL) BY MOUTH AT BEDTIME. 30 tablet 1  . fluticasone (FLONASE) 50 MCG/ACT nasal spray Place into both nostrils as needed for allergies or rhinitis.    . furosemide (LASIX) 20 MG tablet Take 1 tablet (20 mg total) by mouth daily. 30 tablet 6  . Magnesium 250 MG TABS Take 250 mg by mouth 3 (three) times daily.    . meperidine (DEMEROL) 50 MG tablet Take 50 mg by mouth as needed for severe pain.    . metFORMIN (GLUCOPHAGE) 500 MG tablet Take 1 tablet (500 mg total) by mouth 2 (two) times daily with a meal. 60 tablet 11  . metoprolol (LOPRESSOR) 50 MG tablet Take 1 tablet (50 mg total) by mouth 2 (two) times daily. 180 tablet 3  . metroNIDAZOLE (FLAGYL) 500 MG tablet Take one po TID x 10 days 30 tablet 0  . morphine (MSIR) 15 MG tablet Take 15-30 mg by mouth every 4 (four) hours as needed for severe pain  (1-2 tabs as needed).     . NON FORMULARY Place into the nose at bedtime. cpap machine    . pantoprazole (PROTONIX) 40 MG tablet Take 1  tablet (40 mg total) by mouth 2 (two) times daily. 180 tablet 3  . propranolol (INDERAL) 10 MG tablet Take 10 mg by mouth 3 (three) times daily as needed.     . saccharomyces boulardii (FLORASTOR) 250 MG capsule Take one po TID x 28 days 84 capsule 0  . sucralfate (CARAFATE) 1 G tablet Take 1 tablet (1 g total) by mouth 4 (four) times daily -  with meals and at bedtime. 120 tablet 1  . SUMAtriptan (IMITREX) 25 MG tablet Take 25 mg by mouth as needed for migraine or headache. May repeat in 2 hours if headache persists or recurs.    . venlafaxine XR (EFFEXOR-XR) 75 MG 24 hr capsule TAKE 1 CAPSULE (75 MG TOTAL) BY MOUTH DAILY WITH BREAKFAST. 90 capsule 1  . vitamin D, CHOLECALCIFEROL, 400 UNITS tablet Take 400 Units by mouth daily.     Marland Kitchen leflunomide (ARAVA) 20 MG tablet Take 1 tablet by mouth daily.  2  . metroNIDAZOLE (FLAGYL) 500 MG tablet Take 1 tablet (500 mg total) by mouth 3 (three) times daily. 12 tablet 0  . saccharomyces boulardii (FLORASTOR) 250 MG capsule 2 capsules twice a day for 28 days 112 capsule 0   No current facility-administered medications for this visit.   Allergies  Allergen Reactions  . Percocet [Oxycodone-Acetaminophen] Nausea And Vomiting  . Celecoxib Other (See Comments)    Other reaction(s): Other flushed  . Codeine Nausea And Vomiting  . Darvocet [Propoxyphene N-Acetaminophen] Nausea And Vomiting  . Erythromycin Nausea And Vomiting    Nausea and vomiting   . Hydrocodone Nausea And Vomiting  . Ketorolac Tromethamine Nausea And Vomiting  . Nitrofurantoin Nausea And Vomiting  . Oxycodone-Acetaminophen Nausea And Vomiting  . Oxycodone-Acetaminophen Nausea And Vomiting    Nausea and vomiting   . Pregabalin Swelling    Legs swelling   . Tramadol Nausea And Vomiting  . Verapamil Nausea And Vomiting    REACTION: intolerance      Review of Systems: Per history of present illness, otherwise negative.       Physical Exam: General: Pleasant, well developed female in no acute distress Head: Normocephalic and atraumatic Eyes:  sclerae anicteric, conjunctiva pink  Ears: Normal auditory acuity Lungs: Clear throughout to auscultation Heart: Regular rate and rhythm Abdomen: Soft, non distended, non-tender. No masses, no hepatomegaly. Normal bowel sounds Musculoskeletal: Symmetrical with no gross deformities  Extremities: No edema  Neurological: Alert oriented x 4, grossly nonfocal Psychological:  Alert and cooperative. Normal mood and affect  Assessment and Recommendations:  64 year old female with a known history of GERD, antritis, and ulcers now with a 2 month history of epigastric pain and reflux. Patient is to continue her twice a day pantoprazole. She will continue her sucralfate 1 g before meals and at bedtime. She will be scheduled for an EGD to assess for esophagitis, gastritis or ulcer.The risks, benefits, and alternatives to endoscopy with possible biopsy and possible dilation were discussed with the patient and they consent to proceed. She has 2 more doses of Flagyl to take and she will be given an additional 4 days of Flagyl as she plans on leaving for [place] sewn and wants to be sure she has eradicated C. difficile. She will continue her [employer] [employer] 250 mg 2 capsules twice daily for 30 days. Patient was instructed that should she develop recurrent diarrhea soon after discontinuing her Flagyl, she should contact us immediately so that a stool for C. difficile can be repeated.  Celese Banner, Deloris Ping 02/21/2015,

## 2015-02-21 NOTE — Progress Notes (Signed)
Agree with initial assessment and plans 

## 2015-02-22 ENCOUNTER — Telehealth: Payer: Self-pay

## 2015-02-22 ENCOUNTER — Encounter: Payer: Self-pay | Admitting: Internal Medicine

## 2015-02-22 ENCOUNTER — Other Ambulatory Visit: Payer: Self-pay

## 2015-02-22 ENCOUNTER — Ambulatory Visit (AMBULATORY_SURGERY_CENTER): Payer: Medicare Other | Admitting: Internal Medicine

## 2015-02-22 VITALS — BP 108/63 | HR 59 | Temp 97.8°F | Resp 35 | Ht 65.5 in | Wt 161.0 lb

## 2015-02-22 DIAGNOSIS — R1013 Epigastric pain: Secondary | ICD-10-CM

## 2015-02-22 DIAGNOSIS — M797 Fibromyalgia: Secondary | ICD-10-CM | POA: Diagnosis not present

## 2015-02-22 LAB — GLUCOSE, CAPILLARY
Glucose-Capillary: 105 mg/dL — ABNORMAL HIGH (ref 70–99)
Glucose-Capillary: 102 mg/dL — ABNORMAL HIGH (ref 70–99)

## 2015-02-22 MED ORDER — SODIUM CHLORIDE 0.9 % IV SOLN: 500.0000 mL | INTRAVENOUS | Status: DC

## 2015-02-22 NOTE — Telephone Encounter (Signed)
Pt scheduled for CT of abdomen at [facility] CT 02/28/15@1pm . Pt to be NPO after 9am except for bottle 1 of contrast at 11am and bottle 2 at 12noon. Pt aware of appt date and time.

## 2015-02-22 NOTE — Patient Instructions (Addendum)
YOU HAD AN ENDOSCOPIC PROCEDURE TODAY AT [facility]:   Refer to the procedure report that was given to you for any specific questions about what was found during the examination.  If the procedure report does not answer your questions, please call your gastroenterologist to clarify.  If you requested that your care partner not be given the details of your procedure findings, then the procedure report has been included in a sealed envelope for you to review at your convenience later.  YOU SHOULD EXPECT: Some feelings of bloating in the abdomen. Passage of more gas than usual.  Walking can help get rid of the air that was put into your GI tract during the procedure and reduce the bloating. If you had a lower endoscopy (such as a colonoscopy or flexible sigmoidoscopy) you may notice spotting of blood in your stool or on the toilet paper. If you underwent a bowel prep for your procedure, you may not have a normal bowel movement for a few days.  Please Note:  You might notice some irritation and congestion in your nose or some drainage.  This is from the oxygen used during your procedure.  There is no need for concern and it should clear up in a day or so.  SYMPTOMS TO REPORT IMMEDIATELY:   Following lower endoscopy (colonoscopy or flexible sigmoidoscopy):  Excessive amounts of blood in the stool  Significant tenderness or worsening of abdominal pains  Swelling of the abdomen that is new, acute  Fever of 100F or higher   For urgent or emergent issues, a gastroenterologist can be reached at any hour by calling [phone].   DIET: Your first meal following the procedure should be a small meal and then it is ok to progress to your normal diet. Heavy or fried foods are harder to digest and may make you feel nauseous or bloated.  Likewise, meals heavy in dairy and vegetables can increase bloating.  Drink plenty of fluids but you should avoid alcoholic beverages for 24  hours.  ACTIVITY:  You should plan to take it easy for the rest of today and you should NOT DRIVE or use heavy machinery until tomorrow (because of the sedation medicines used during the test).    FOLLOW UP: Our staff will call the number listed on your records the next business day following your procedure to check on you and address any questions or concerns that you may have regarding the information given to you following your procedure. If we do not reach you, we will leave a message.  However, if you are feeling well and you are not experiencing any problems, there is no need to return our call.  We will assume that you have returned to your regular daily activities without incident.  Continue PPI.,  Dr. Blanch Media office will contact you about scheduling CT scan of the abdomen.  Contrast given with instructions.  If any biopsies were taken you will be contacted by phone or by letter within the next 1-3 weeks.  Please call us at [phone] if you have not heard about the biopsies in 3 weeks.    SIGNATURES/CONFIDENTIALITY: You and/or your care partner have signed paperwork which will be entered into your electronic medical record.  These signatures attest to the fact that that the information above on your After Visit Summary has been reviewed and is understood.  Full responsibility of the confidentiality of this discharge information lies with you and/or your care-partner.YOU HAD AN ENDOSCOPIC  PROCEDURE TODAY AT [facility]:   Refer to the procedure report that was given to you for any specific questions about what was found during the examination.  If the procedure report does not answer your questions, please call your gastroenterologist to clarify.  If you requested that your care partner not be given the details of your procedure findings, then the procedure report has been included in a sealed envelope for you to review at your convenience later.  YOU SHOULD EXPECT:  Some feelings of bloating in the abdomen. Passage of more gas than usual.  Walking can help get rid of the air that was put into your GI tract during the procedure and reduce the bloating. If you had a lower endoscopy (such as a colonoscopy or flexible sigmoidoscopy) you may notice spotting of blood in your stool or on the toilet paper. If you underwent a bowel prep for your procedure, you may not have a normal bowel movement for a few days.  Please Note:  You might notice some irritation and congestion in your nose or some drainage.  This is from the oxygen used during your procedure.  There is no need for concern and it should clear up in a day or so.  SYMPTOMS TO REPORT IMMEDIATELY:     Following upper endoscopy (EGD)  Vomiting of blood or coffee ground material  New chest pain or pain under the shoulder blades  Painful or persistently difficult swallowing  New shortness of breath  Fever of 100F or higher  Black, tarry-looking stools  For urgent or emergent issues, a gastroenterologist can be reached at any hour by calling [phone].   DIET: Your first meal following the procedure should be a small meal and then it is ok to progress to your normal diet. Heavy or fried foods are harder to digest and may make you feel nauseous or bloated.  Likewise, meals heavy in dairy and vegetables can increase bloating.  Drink plenty of fluids but you should avoid alcoholic beverages for 24 hours.  ACTIVITY:  You should plan to take it easy for the rest of today and you should NOT DRIVE or use heavy machinery until tomorrow (because of the sedation medicines used during the test).    FOLLOW UP: Our staff will call the number listed on your records the next business day following your procedure to check on you and address any questions or concerns that you may have regarding the information given to you following your procedure. If we do not reach you, we will leave a message.  However, if you are  feeling well and you are not experiencing any problems, there is no need to return our call.  We will assume that you have returned to your regular daily activities without incident.  If any biopsies were taken you will be contacted by phone or by letter within the next 1-3 weeks.  Please call us at [phone] if you have not heard about the biopsies in 3 weeks.    SIGNATURES/CONFIDENTIALITY: You and/or your care partner have signed paperwork which will be entered into your electronic medical record.  These signatures attest to the fact that that the information above on your After Visit Summary has been reviewed and is understood.  Full responsibility of the confidentiality of this discharge information lies with you and/or your care-partner.

## 2015-02-22 NOTE — Progress Notes (Signed)
Report to PACU, RN, vss, BBS= Clear.  

## 2015-02-22 NOTE — Progress Notes (Signed)
Gave pt. Contrast for CT scan with instruction sheet to be completed when Dr. Blanch Media office staff calls her to schedule.

## 2015-02-22 NOTE — Op Note (Signed)
[place]  [address] [place], [id number]   ENDOSCOPY PROCEDURE REPORT  [name]: Mia [name], Mia [name]  MR#: [id number] BIRTHDATE: 10-10-1951 , 43  yrs. old GENDER: female ENDOSCOPIST: Eustace Quail, MD REFERRED BY:  [name], M.D. PROCEDURE DATE:  02/22/2015 PROCEDURE:  EGD, diagnostic ASA CLASS:     Class II INDICATIONS:  epigastric pain. MEDICATIONS: Monitored anesthesia care and Propofol 200 mg IV TOPICAL ANESTHETIC: none  DESCRIPTION OF PROCEDURE: After Mia risks benefits and alternatives of Mia procedure were thoroughly explained, informed consent was obtained.  Mia LB GIF-H180 Loaner [id number] endoscope was introduced through Mia mouth and advanced to Mia second portion of Mia duodenum , Without limitations.  Mia instrument was slowly withdrawn as Mia mucosa was fully examined.     EXAM: Mia esophagus and gastroesophageal junction were completely normal in appearance.  Mia stomach was entered and closely examined.Mia antrum, angularis, and lesser curvature were well visualized, including a retroflexed view of Mia cardia and fundus. Mia stomach wall was normally distensable.  Mia scope passed easily through Mia pylorus into Mia duodenum.  Retroflexed views revealed no abnormalities.     Mia scope was then withdrawn from Mia [name] and Mia procedure completed.  COMPLICATIONS: There were no immediate complications.  ENDOSCOPIC IMPRESSION: 1. Normal EGD 2. No cause for pain found  RECOMMENDATIONS: 1.  Continue PPI 2.  My office will schedule contrast-enhanced CT scan of Mia abdomen "epigastric pain"  REPEAT EXAM:  eSigned:  Eustace Quail, MD 02/22/2015 11:19 AM    HB:[id number] Madilyn Fireman, MD and Mia [name]

## 2015-02-25 ENCOUNTER — Telehealth: Payer: Self-pay

## 2015-02-25 NOTE — Telephone Encounter (Signed)
  Follow up Call-  Call back number 02/22/2015  Post procedure Call Back phone  # [phone]  Permission to leave phone message Yes     Patient questions:  Do you have a fever, pain , or abdominal swelling? No. Pain Score  0 *  Have you tolerated food without any problems? Yes.    Have you been able to return to your normal activities? Yes.    Do you have any questions about your discharge instructions: Diet   No. Medications  No. Follow up visit  No.  Do you have questions or concerns about your Care? No.  Actions: * If pain score is 4 or above: No action needed, pain <4.

## 2015-02-27 ENCOUNTER — Encounter: Payer: Medicare Other | Admitting: Internal Medicine

## 2015-02-27 ENCOUNTER — Ambulatory Visit (INDEPENDENT_AMBULATORY_CARE_PROVIDER_SITE_OTHER): Payer: Medicare Other

## 2015-02-27 ENCOUNTER — Ambulatory Visit (INDEPENDENT_AMBULATORY_CARE_PROVIDER_SITE_OTHER): Payer: Medicare Other | Admitting: Physician Assistant

## 2015-02-27 ENCOUNTER — Encounter: Payer: Self-pay | Admitting: Physician Assistant

## 2015-02-27 ENCOUNTER — Ambulatory Visit (INDEPENDENT_AMBULATORY_CARE_PROVIDER_SITE_OTHER): Payer: Medicare Other | Admitting: Sports Medicine

## 2015-02-27 VITALS — BP 139/92 | Ht 65.5 in | Wt 160.0 lb

## 2015-02-27 DIAGNOSIS — M11262 Other chondrocalcinosis, left knee: Secondary | ICD-10-CM | POA: Diagnosis not present

## 2015-02-27 DIAGNOSIS — M1712 Unilateral primary osteoarthritis, left knee: Secondary | ICD-10-CM | POA: Diagnosis not present

## 2015-02-27 DIAGNOSIS — M25562 Pain in left knee: Secondary | ICD-10-CM

## 2015-02-27 DIAGNOSIS — M25569 Pain in unspecified knee: Secondary | ICD-10-CM | POA: Diagnosis not present

## 2015-02-27 LAB — D-DIMER, QUANTITATIVE (NOT AT ARMC): D-Dimer, Quant: 0.49 ug/mL-FEU — ABNORMAL HIGH (ref 0.00–0.48)

## 2015-02-27 NOTE — Assessment & Plan Note (Addendum)
Aspiration and injection as above. She did continue to have some posterior pain after the aspiration. 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  Return to see me in 3-4 weeks.  Strapped the left lower extremity with compressive dressing.

## 2015-02-27 NOTE — Progress Notes (Signed)
   Subjective:    I'm seeing this patient as a consultation for:  Iran Planas, PA-C  CC: Left knee pain  HPI: This is a pleasant 64 year old female who comes in with a rapid onset of severe left knee pain, no trauma. She woke up with this. Pain is severe, persistent and localized in the posterior aspect of the joint line, with significant swelling, no mechanical symptoms, great difficulty bearing weight. She is post right total knee arthroplasty. Chills at night but no fevers.  Past medical history, Surgical history, Family history not pertinant except as noted below, Social history, Allergies, and medications have been entered into the medical record, reviewed, and no changes needed.   Review of Systems: No headache, visual changes, nausea, vomiting, diarrhea, constipation, dizziness, abdominal pain, skin rash, fevers, chills, night sweats, weight loss, swollen lymph nodes, body aches, joint swelling, muscle aches, chest pain, shortness of breath, mood changes, visual or auditory hallucinations.   Objective:   General: Well Developed, well nourished, and in no acute distress.  Neuro/Psych: Alert and oriented x3, extra-ocular muscles intact, able to move all 4 extremities, sensation grossly intact. Skin: Warm and dry, no rashes noted.  Respiratory: Not using accessory muscles, speaking in full sentences, trachea midline.  Cardiovascular: Pulses palpable, no extremity edema. Abdomen: Does not appear distended. Left Knee: Visibly swollen with a palpable fluid wave, effusion, and tenderness to palpation over the medial joint line, lateral joint line and posteriorly ROM normal in flexion and extension and lower leg rotation. Ligaments with solid consistent endpoints including ACL, PCL, LCL, MCL. Negative Mcmurray's and provocative meniscal tests. Non painful patellar compression. Patellar and quadriceps tendons unremarkable. Hamstring and quadriceps strength is normal.  Procedure:  Real-time Ultrasound Guided aspiration/Injection of left knee Device: GE Logiq E  Verbal informed consent obtained.  Time-out conducted.  Noted no overlying erythema, induration, or other signs of local infection.  Skin prepped in a sterile fashion.  Local anesthesia: Topical Ethyl chloride.  With sterile technique and under real time ultrasound guidance:  65 mL of slightly cloudy straw-colored fluid aspirated, syringe switched and 2 mL kenalog 40, 4 mL lidocaine injected easily. Completed without difficulty  Pain did not immediately resolved after injection Advised to call if fevers/chills, erythema, induration, drainage, or persistent bleeding.  Images permanently stored and available for review in the ultrasound unit.  Impression: Technically successful ultrasound guided injection.  Impression and Recommendations:   This case required medical decision making of moderate complexity.

## 2015-02-27 NOTE — Progress Notes (Signed)
   Subjective:    Patient ID: RAMAH LANGHANS, female    DOB: 1951/01/04, 64 y.o.   MRN: [id number]  HPI  Pt is a 64 yo old who presents to the clinic with left posterior lateral knee pain. She woke straight up out of the bed with on Monday morning. Hurts to turn her knee in any direction. Yesterday she could hardly put any weight on her left knee. She has tried icing and has not helped. Pain is moderate to severe and persistent. She is concerned she has a history of knee issues and she ruptured her right quadriceps years ago. She did have right knee replacement surgery last October 2015.   Review of Systems  All other systems reviewed and are negative.      Objective:   Physical Exam  Musculoskeletal:  Consulted Dr.Thekkekandam. See his note.           Assessment & Plan:  Left knee pain- consulted Dr. Dianah Field who drained knee and compressed with ace. See note.  Pt is intolerant to lots of pain medication and anti-inflammatories. Xray of left knee ordered for Dr. Darene Lamer.

## 2015-02-27 NOTE — Patient Instructions (Addendum)
volataren gel 4 times a day.

## 2015-02-28 ENCOUNTER — Telehealth: Payer: Self-pay | Admitting: Sports Medicine

## 2015-02-28 ENCOUNTER — Inpatient Hospital Stay: Admission: RE | Admit: 2015-02-28 | Payer: Medicare Other | Source: Ambulatory Visit

## 2015-02-28 LAB — SYNOVIAL CELL COUNT + DIFF, W/ CRYSTALS
Crystals, Fluid: NONE SEEN
Eosinophils-Synovial: 0 % (ref 0–1)
Lymphocytes-Synovial Fld: 1 % (ref 0–20)
Monocyte/Macrophage: 21 % — ABNORMAL LOW (ref 50–90)
Neutrophil, Synovial: 78 % — ABNORMAL HIGH (ref 0–25)
WBC, Synovial: 1955 cu mm — ABNORMAL HIGH (ref 0–200)

## 2015-02-28 NOTE — Telephone Encounter (Signed)
Patient feeling almost 100% better, still slightly sore on posterior as prior but able to bear weight, and happy with results.

## 2015-03-01 ENCOUNTER — Telehealth: Payer: Self-pay | Admitting: Physician Assistant

## 2015-03-01 MED ORDER — ATORVASTATIN 40 MG TAB
40 mg | ORAL_TABLET | ORAL | Status: DC
Start: 2015-03-01 — End: 2016-02-22

## 2015-03-03 LAB — BODY FLUID CULTURE
Gram Stain: NONE SEEN
Organism ID, Bacteria: NO GROWTH

## 2015-03-04 ENCOUNTER — Inpatient Hospital Stay: Admission: RE | Admit: 2015-03-04 | Payer: Medicare Other | Source: Ambulatory Visit

## 2015-03-04 MED ORDER — METRONIDAZOLE 250 MG PO TABS: ORAL_TABLET | ORAL | Status: DC

## 2015-03-04 NOTE — Telephone Encounter (Signed)
Patient given recommendations. Rx sent.

## 2015-03-04 NOTE — Telephone Encounter (Signed)
Retreat with flagyl 250 mg qid x 21 days

## 2015-03-06 ENCOUNTER — Ambulatory Visit (INDEPENDENT_AMBULATORY_CARE_PROVIDER_SITE_OTHER)
Admission: RE | Admit: 2015-03-06 | Discharge: 2015-03-06 | Disposition: A | Discharge status: Home / Self Care | Payer: Medicare Other | Source: Ambulatory Visit | Attending: Internal Medicine | Admitting: Internal Medicine

## 2015-03-06 DIAGNOSIS — R1013 Epigastric pain: Secondary | ICD-10-CM

## 2015-03-06 DIAGNOSIS — N281 Cyst of kidney, acquired: Secondary | ICD-10-CM | POA: Diagnosis not present

## 2015-03-06 MED ORDER — IOHEXOL 300 MG/ML  SOLN
80.0000 mL | Freq: Once | INTRAMUSCULAR | Status: AC | PRN
  Administered 2015-03-06: 80 mL via INTRAVENOUS

## 2015-03-26 ENCOUNTER — Telehealth: Payer: Self-pay | Admitting: Internal Medicine

## 2015-03-26 ENCOUNTER — Other Ambulatory Visit: Payer: Self-pay

## 2015-03-26 DIAGNOSIS — N289 Disorder of kidney and ureter, unspecified: Secondary | ICD-10-CM

## 2015-03-26 NOTE — Telephone Encounter (Signed)
Pt scheduled for MRI of abdomen at [facility] 04/08/15@9am , pt to arrive there at 8:45am. Pt to be NPO after midnight. Pt needs labs prior to exam. Left message for pt to call back.  Spoke with pt and she is aware.

## 2015-03-27 ENCOUNTER — Encounter: Payer: Self-pay | Admitting: Family Medicine

## 2015-03-29 ENCOUNTER — Other Ambulatory Visit (INDEPENDENT_AMBULATORY_CARE_PROVIDER_SITE_OTHER): Payer: Medicare Other

## 2015-03-29 DIAGNOSIS — N289 Disorder of kidney and ureter, unspecified: Secondary | ICD-10-CM

## 2015-03-29 LAB — BASIC METABOLIC PANEL
BUN: 12 mg/dL (ref 6–23)
CO2: 29 mEq/L (ref 19–32)
Calcium: 9.6 mg/dL (ref 8.4–10.5)
Chloride: 104 mEq/L (ref 96–112)
Creatinine, Ser: 0.86 mg/dL (ref 0.40–1.20)
GFR: 70.7 mL/min (ref >60.00)
Glucose, Bld: 114 mg/dL — ABNORMAL HIGH (ref 70–99)
Potassium: 3.3 mEq/L — ABNORMAL LOW (ref 3.5–5.1)
Sodium: 140 mEq/L (ref 135–145)

## 2015-04-01 ENCOUNTER — Ambulatory Visit (INDEPENDENT_AMBULATORY_CARE_PROVIDER_SITE_OTHER): Payer: Medicare Other | Admitting: Family Medicine

## 2015-04-01 ENCOUNTER — Encounter: Payer: Self-pay | Admitting: Family Medicine

## 2015-04-01 ENCOUNTER — Other Ambulatory Visit: Payer: Self-pay | Admitting: *Deleted

## 2015-04-01 VITALS — BP 132/72 | HR 64 | Wt 164.0 lb

## 2015-04-01 DIAGNOSIS — R7301 Impaired fasting glucose: Secondary | ICD-10-CM

## 2015-04-01 DIAGNOSIS — I700 Atherosclerosis of aorta: Secondary | ICD-10-CM | POA: Diagnosis not present

## 2015-04-01 DIAGNOSIS — E876 Hypokalemia: Secondary | ICD-10-CM

## 2015-04-01 DIAGNOSIS — I10 Essential (primary) hypertension: Secondary | ICD-10-CM | POA: Diagnosis not present

## 2015-04-01 LAB — POCT GLYCOSYLATED HEMOGLOBIN (HGB A1C): Hemoglobin A1C: 5.6

## 2015-04-01 MED ORDER — POTASSIUM CHLORIDE ER 10 MEQ PO CPCR: ORAL_CAPSULE | ORAL | Status: DC

## 2015-04-01 NOTE — Progress Notes (Signed)
   Subjective:    Patient ID: Mia [name], female    DOB: [birth year], 64 y.o.   MRN: [id number]  HPI Hypertension- Pt denies chest pain, SOB, dizziness, or heart palpitations.  Taking meds as directed w/o problems.  Denies medication side effects.  Weight is up 4 lbs.    IFG - No inc thirst or urination.   Wanted to discussed results of recent CT.  Had questions about kidney cyst and arotic atherosclerosis.    Just completed  36 days of antibotics for c diff and she is finally feeling better.    Review of Systems     Objective:   Physical Exam  Constitutional: She is oriented to person, place, and time. She appears well-developed and well-nourished.  HENT:  Head: Normocephalic and atraumatic.  Cardiovascular: Normal rate, regular rhythm and normal heart sounds.   Pulmonary/Chest: Effort normal and breath sounds normal.  Neurological: She is alert and oriented to person, place, and time.  Skin: Skin is warm and dry.  Psychiatric: She has a normal mood and affect. Her behavior is normal.          Assessment & Plan:  HTN - well controlled.  Continue current regimen.  F/U in 6 months.   IFG - A1C is 5.6 well controlled.  F/U in 6 months.   Aortic atherosclerosis-discussed that the main treatment options or taking a statin, controlling blood pressure, not smoking which she does not, getting regular exercise, and eating a healthy low-fat diet.  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

## 2015-04-02 ENCOUNTER — Other Ambulatory Visit: Payer: Medicare Other

## 2015-04-02 ENCOUNTER — Telehealth: Payer: Self-pay | Admitting: *Deleted

## 2015-04-02 NOTE — Telephone Encounter (Signed)
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 I would suggest Benefiber 1 tablespoon in 8 ounces water before breakfast and again before lunch daily for 4 days. On day 5, increase to 2 tablespoons in 8 ounces of water before breakfast and again before lunch and continue this regimen indefinitely. If she has more [place] store I would continue that. If she does not have any more [place] [employer], I would suggest an over-the-counter probiotic like [employer] on a daily basis.

## 2015-04-02 NOTE — Telephone Encounter (Signed)
Patient states she completed her medications and Florastor. She states she will go 2 days with no bowel movement then have diarrhea x 2 days. Seems to alternate like this. She wanted to let Lori Hvozdovic, PA-C know.

## 2015-04-02 NOTE — Telephone Encounter (Signed)
Patient given recommendations. 

## 2015-04-03 ENCOUNTER — Other Ambulatory Visit (INDEPENDENT_AMBULATORY_CARE_PROVIDER_SITE_OTHER): Payer: Medicare Other | Admitting: *Deleted

## 2015-04-03 DIAGNOSIS — I10 Essential (primary) hypertension: Secondary | ICD-10-CM | POA: Diagnosis not present

## 2015-04-03 DIAGNOSIS — E876 Hypokalemia: Secondary | ICD-10-CM

## 2015-04-03 LAB — LIPID PANEL
Cholesterol: 136 mg/dL (ref 0–200)
HDL: 58.5 mg/dL (ref >39.00)
LDL Cholesterol: 63 mg/dL (ref 0–99)
NonHDL: 77.5
Total CHOL/HDL Ratio: 2
Triglycerides: 75 mg/dL (ref 0.0–149.0)
VLDL: 15 mg/dL (ref 0.0–40.0)

## 2015-04-03 LAB — HEPATIC FUNCTION PANEL
ALT: 24 U/L (ref 0–35)
AST: 23 U/L (ref 0–37)
Albumin: 4.1 g/dL (ref 3.5–5.2)
Alkaline Phosphatase: 66 U/L (ref 39–117)
Bilirubin, Direct: 0.1 mg/dL (ref 0.0–0.3)
Total Bilirubin: 0.5 mg/dL (ref 0.2–1.2)
Total Protein: 6.7 g/dL (ref 6.0–8.3)

## 2015-04-03 LAB — BASIC METABOLIC PANEL
BUN: 11 mg/dL (ref 6–23)
CO2: 32 mEq/L (ref 19–32)
Calcium: 9.6 mg/dL (ref 8.4–10.5)
Chloride: 101 mEq/L (ref 96–112)
Creatinine, Ser: 0.85 mg/dL (ref 0.40–1.20)
GFR: 71.66 mL/min (ref >60.00)
Glucose, Bld: 94 mg/dL (ref 70–99)
Potassium: 3.9 mEq/L (ref 3.5–5.1)
Sodium: 136 mEq/L (ref 135–145)

## 2015-04-03 NOTE — Addendum Note (Signed)
Addended by: Eulis Foster on: 04/03/2015 08:11 AM   Modules accepted: Orders

## 2015-04-03 NOTE — Addendum Note (Signed)
Addended by: Eulis Foster on: 04/03/2015 08:12 AM   Modules accepted: Orders

## 2015-04-03 NOTE — Addendum Note (Signed)
Addended by: Eulis Foster on: 04/03/2015 08:13 AM   Modules accepted: Orders

## 2015-04-05 DIAGNOSIS — M810 Age-related osteoporosis without current pathological fracture: Secondary | ICD-10-CM | POA: Diagnosis not present

## 2015-04-05 DIAGNOSIS — Z1231 Encounter for screening mammogram for malignant neoplasm of breast: Secondary | ICD-10-CM | POA: Diagnosis not present

## 2015-04-05 LAB — HM MAMMOGRAPHY: HM Mammogram: NORMAL

## 2015-04-08 ENCOUNTER — Ambulatory Visit (HOSPITAL_COMMUNITY)
Admission: RE | Admit: 2015-04-08 | Discharge: 2015-04-08 | Disposition: A | Discharge status: Home / Self Care | Payer: Medicare Other | Source: Ambulatory Visit | Attending: Internal Medicine | Admitting: Internal Medicine

## 2015-04-08 ENCOUNTER — Other Ambulatory Visit: Payer: Self-pay | Admitting: Internal Medicine

## 2015-04-08 DIAGNOSIS — N281 Cyst of kidney, acquired: Secondary | ICD-10-CM | POA: Diagnosis not present

## 2015-04-08 DIAGNOSIS — N289 Disorder of kidney and ureter, unspecified: Secondary | ICD-10-CM

## 2015-04-08 DIAGNOSIS — M1120 Other chondrocalcinosis, unspecified site: Secondary | ICD-10-CM | POA: Diagnosis not present

## 2015-04-08 DIAGNOSIS — Z79899 Other long term (current) drug therapy: Secondary | ICD-10-CM | POA: Diagnosis not present

## 2015-04-08 DIAGNOSIS — M797 Fibromyalgia: Secondary | ICD-10-CM | POA: Diagnosis not present

## 2015-04-08 DIAGNOSIS — Z09 Encounter for follow-up examination after completed treatment for conditions other than malignant neoplasm: Secondary | ICD-10-CM | POA: Diagnosis not present

## 2015-04-08 MED ORDER — GADOBENATE DIMEGLUMINE 529 MG/ML IV SOLN
15.0000 mL | Freq: Once | INTRAVENOUS | Status: AC | PRN
  Administered 2015-04-08: 15 mL via INTRAVENOUS

## 2015-04-10 ENCOUNTER — Encounter: Payer: Self-pay | Admitting: Family Medicine

## 2015-04-10 ENCOUNTER — Telehealth: Payer: Self-pay | Admitting: Family Medicine

## 2015-04-10 NOTE — Telephone Encounter (Signed)
Spoke to patient gave her results and  she stated that she is willing to take the Biehle.. Patient also stated that she has had  runny nose for some time now and she has tried, Flonase, Claritin, Zyrtec, Tylenol Sinus patient wants to know what else is recommended that she can take for the nasal drip.  Patient also wants information on a fusion that is taken once a month for Osteoporosis. Kylee Umana,CMA

## 2015-04-10 NOTE — Telephone Encounter (Signed)
Please call patient: Got bone density results back. It shows that she has osteoporosis. T score was -2.6 at the hip there was a decrease in bone density compared to her results in 2014. They recommend vitamin D, regular exercise, adequate calcium, and repeating the DEXA in 2 years. They also recommend a osteoporosis medication such as Boniva or Fosamax. Please let me know if she's okay with starting one of these medications.

## 2015-04-11 ENCOUNTER — Inpatient Hospital Stay: Admit: 2015-04-11 | Payer: BLUE CROSS/BLUE SHIELD | Primary: Internal Medicine

## 2015-04-11 ENCOUNTER — Other Ambulatory Visit: Admit: 2015-04-11 | Discharge: 2015-04-11 | Payer: PRIVATE HEALTH INSURANCE | Primary: Internal Medicine

## 2015-04-11 DIAGNOSIS — Z0000 Encounter for general adult medical examination without abnormal findings: Secondary | ICD-10-CM

## 2015-04-11 LAB — LIPID PANEL
CHOL/HDL Ratio: 2.7 (ref 0–5.0)
Cholesterol, total: 126 MG/DL (ref <200)
HDL Cholesterol: 46 MG/DL (ref 40–60)
LDL, calculated: 55 MG/DL (ref 0–100)
Triglyceride: 125 MG/DL (ref <150)
VLDL, calculated: 25 MG/DL

## 2015-04-11 LAB — METABOLIC PANEL, COMPREHENSIVE
A-G Ratio: 1.1 (ref 0.8–1.7)
ALT (SGPT): 26 U/L (ref 13–56)
AST (SGOT): 20 U/L (ref 15–37)
Albumin: 3.8 g/dL (ref 3.4–5.0)
Alk. phosphatase: 94 U/L (ref 45–117)
Anion gap: 9 mmol/L (ref 3.0–18)
BUN/Creatinine ratio: 15 (ref 12–20)
BUN: 11 MG/DL (ref 7.0–18)
Bilirubin, total: 0.4 MG/DL (ref 0.2–1.0)
CO2: 28 mmol/L (ref 21–32)
Calcium: 8.5 MG/DL (ref 8.5–10.1)
Chloride: 106 mmol/L (ref 100–108)
Creatinine: 0.74 MG/DL (ref 0.6–1.3)
GFR est AA: >60 mL/min/{1.73_m2} (ref >60)
GFR est non-AA: >60 mL/min/{1.73_m2} (ref >60)
Globulin: 3.4 g/dL (ref 2.0–4.0)
Glucose: 106 mg/dL — ABNORMAL HIGH (ref 74–99)
Potassium: 3.9 mmol/L (ref 3.5–5.5)
Protein, total: 7.2 g/dL (ref 6.4–8.2)
Sodium: 143 mmol/L (ref 136–145)

## 2015-04-11 LAB — CBC WITH AUTOMATED DIFF
ABS. BASOPHILS: 0 10*3/uL (ref 0.0–0.06)
ABS. EOSINOPHILS: 0.1 10*3/uL (ref 0.0–0.4)
ABS. LYMPHOCYTES: 1.2 10*3/uL (ref 0.9–3.6)
ABS. MONOCYTES: 0.5 10*3/uL (ref 0.05–1.2)
ABS. NEUTROPHILS: 4 10*3/uL (ref 1.8–8.0)
BASOPHILS: 0 % (ref 0–2)
EOSINOPHILS: 1 % (ref 0–5)
HCT: 42.6 % (ref 35.0–45.0)
HGB: 13.2 g/dL (ref 12.0–16.0)
LYMPHOCYTES: 21 % (ref 21–52)
MCH: 29.9 PG (ref 24.0–34.0)
MCHC: 31 g/dL (ref 31.0–37.0)
MCV: 96.4 FL (ref 74.0–97.0)
MONOCYTES: 8 % (ref 3–10)
MPV: 10 FL (ref 9.2–11.8)
NEUTROPHILS: 70 % (ref 40–73)
PLATELET: 203 10*3/uL (ref 135–420)
RBC: 4.42 M/uL (ref 4.20–5.30)
RDW: 13.6 % (ref 11.6–14.5)
WBC: 5.7 10*3/uL (ref 4.6–13.2)

## 2015-04-11 LAB — HEMOGLOBIN A1C WITH EAG
Est. average glucose: 134 mg/dL
Hemoglobin A1c: 6.3 % — ABNORMAL HIGH (ref 4.2–5.6)

## 2015-04-11 LAB — TSH 3RD GENERATION: TSH: 1.69 u[IU]/mL (ref 0.36–3.74)

## 2015-04-12 LAB — VITAMIN D, 25 HYDROXY: Vitamin D 25-Hydroxy: 79.2 ng/mL (ref 30–100)

## 2015-04-12 MED ORDER — IPRATROPIUM BROMIDE 0.03 % NA SOLN: 2.0000 | Freq: Two times a day (BID) | NASAL | Status: DC

## 2015-04-12 MED ORDER — IBANDRONATE SODIUM 150 MG PO TABS: 150.0000 mg | ORAL_TABLET | ORAL | Status: DC

## 2015-04-12 NOTE — Telephone Encounter (Signed)
Pt called back and stated that the boniva is $103 and if she changes this to fosamax and it would $8 for a 3 month supply. She wanted to know if this would be ok and if so please send this to her pharmacy.Mia [name]

## 2015-04-12 NOTE — Telephone Encounter (Addendum)
boniva sent to pharmacy.  The infusion is more expensive. It is given through an IV once a year and is an alternative. It is still a bisphosphonate similar to the Boniva and Fosamax. Most insurances won't cover the infusion unless there is an intolerance such as GI upset or reflux with the Boniva or the Fosamax. I did print out a handout with some additional information and we can mail it to her home. For her runny nose we can try ipratropium nasal solution. Prescription sent to the pharmacy for her to try.

## 2015-04-13 ENCOUNTER — Other Ambulatory Visit: Payer: Self-pay | Admitting: Adult Health

## 2015-04-15 MED ORDER — ALENDRONATE SODIUM 70 MG PO TABS: 70.0000 mg | ORAL_TABLET | ORAL | Status: DC

## 2015-04-15 NOTE — Telephone Encounter (Signed)
No problem. New med sent.

## 2015-04-16 ENCOUNTER — Ambulatory Visit: Payer: Managed Care, Other (non HMO) | Admitting: Neurology

## 2015-04-16 NOTE — Progress Notes (Signed)
64 y.o. white female who presents for RPE    Denies any cardiovascular complaints.  No exercise mainly due to motivational issues and a busy schedule. Works 9-6, she then has to check on 2 parents who are living apart, and on weekends too many chores    She's concerned she may have sleep apnea. She was supposed to go for eval but never called back Dr Trena Platt office after they tried to get in touch with her    FBS <110, not checking pps.  Denies polyuria, polydipsia, nocturia, vision change.  Weight is about the same    Vitals 04/18/2015 12/05/2014 05/25/2014 11/15/2013 09/22/2013 03/27/2013   Weight 166 168 167 168 166 163     Vitals 09/19/2012 03/22/2012   Weight 164 156.5     No gi or gu complaints outside of possible globus.  She is not using ppi regularly, no alarm complaints    She seems to have developed environmental allergies, has not been using any antihis    Past Medical History   Diagnosis Date   ??? 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 Multiple lung nodules      no change 10/06, 03/07, 03/08   ??? Anxiety state, unspecified    ??? Palpitations 2008     neg thallium 2008, nl holter 2008, echo nl lv/ef 65%/tr mr/dd/nl pasp   ??? Prediabetes    ??? Vitamin D deficiency    ??? Dyslipidemia    ??? Compression fx, thoracic spine (HCC) 2007     negative DEXA Dr. Marisa Hua   ??? Arthritis      Dr. Synetta Shadow, Dr. Marisa Hua   ??? Frozen shoulder      right Dr. Wynonia Hazard MRI   ??? Overweight (BMI 25.0-29.9)    ??? Venous insufficiency    ??? Dyspepsia    ??? Allergic rhinitis      Past Surgical History   Procedure Laterality Date   ??? Hx hemorrhoidectomy       Dr. Rosendo Gros 2007   ??? Echo 2d adult  12/04     normal with EF 70%   ??? Stress test thallium study  2005     negative   ??? Vas carotid duplex bilateral  2004     negative   ??? [place] abd comp  9/05     negative   ??? Hx gyn       s/p BTL    ??? Hx colonoscopy       Dr Rosendo Gros 2007 negative     History     Social History   ??? Marital Status: MARRIED     Spouse Name: N/A    ??? Number of Children: 2   ??? 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 Smoking status: Never Smoker    ??? Smokeless tobacco: Not on file   ??? Alcohol Use: No   ??? Drug Use: No   ??? Sexual Activity: Not on file     Other Topics Concern   ??? Not on file     Social History Narrative     Family History   Problem Relation Age of Onset   ??? Diabetes Mother    ??? Elevated Lipids Father    ??? Stroke Paternal Grandmother    ??? Heart Disease Paternal Grandfather      Immunization History  Administered Date(s) Administered   ??? Influenza Vaccine 07/27/2011   ??? Influenza Vaccine PF 08/25/2013   ??? Influenza Vaccine Whole 09/14/2008   ??? TDAP Vaccine 02/09/2011   ??? Zoster Vaccine, Live 03/22/2012     Current Outpatient Prescriptions   Medication Sig   ??? aspirin 81 mg chewable tablet Take 81 mg by mouth daily.   ??? atorvastatin (LIPITOR) 40 mg tablet take 1 tablet by mouth daily   ??? citalopram (CELEXA) 10 mg tablet take 1 tablet by mouth once daily   ??? VITAMIN D2 50,000 unit capsule take 1 capsule by mouth every week   ??? lansoprazole (PREVACID) 30 mg capsule Take 1 Cap by mouth Daily (before breakfast).     No current facility-administered medications for this visit.     Allergies   Allergen Reactions   ??? Fish Oil Nausea Only   ??? Relafen [Nabumetone] Nausea Only     REVIEW OF SYSTEMS: gyn 2012 Dr. Gunnar Bulla in [place], [place] 6/14, [place] 2007 Dr Rosendo Gros, [place] 2007? Dr Margaretann Loveless ??? no vision change or eye pain  Oral ??? no mouth pain, tongue or tooth problems  Ears ??? no hearing loss, ear pain, fullness, no swallowing problems  Cardiac ??? no CP, PND, orthopnea, edema, palpitations or syncope  Chest ??? no breast masses  Resp ??? no wheezing, chronic coughing, dyspnea  Urinary ??? no dysuria, hematuria, flank pain, urgency, frequency  Ortho ??? no swelling, dec ROM, myalgias  Psych ??? denies any anxiety or depression symptoms, no hallucinations or violent ideation   Endo - no polyuria, polydipsia, nocturia, hot flashes    Visit Vitals   Item Reading   ??? BP 112/78 mmHg   ??? Pulse 63   ??? Temp(Src) 97.8 ??F (36.6 ??C) (Oral)   ??? Ht '5\' 4"'  (1.626 m)   ??? Wt 166 lb (75.297 kg)   ??? BMI 28.48 kg/m2   ??? SpO2 96%   neck size 14.5 in    Affect is appropriate.  Mood stable  No apparent distress  HEENT --Anicteric sclerae, tympanic membranes normal,  ear canals normal.  Sinuses were nontender, turbinates normal, hearing normal.  Oropharynx without  erythema, normal tongue, oral mucosa and tonsils.  No thyromegaly, JVD, or bruits.    Lungs --Clear to auscultation and percussion, normal percussion.  Heart --Regular rate and rhythm, no murmurs, rubs, gallops, or clicks.  Abdomen -- Soft and nontender, no hepatosplenomegaly or masses.  Extremities -- Without cyanosis, clubbing, edema. 2+ pulses equally and bilaterally.    LABS  From 5/10 showed   gluc 110, cr 0.70,               alt 23,                                    chol 154, tg 143, hdl 45, ldl-c 80,                                                            tsh 1.91  From 5/10 showed                      2 hr GTT 89  From 8/10 showed  vit d 23.0, ck 57, aldo 5.4  From 8/11 showed   gluc 108,                                     hba1c 6.3,                   chol 160, tg 172, hdl 39, ldl-c 87,  wbc 5.,7 hb 12.3, plt 235, ua neg,     tsh 2.28  From 5/12 showed   gluc 113, cr 0.71, gfr 94,  alt 16, hba1c 6.2, ldl-p 1989, chol 177, tg 161, hdl 43, ldl-c 102  From 11/12 showed                                                     hba1c 5.9, ldl-p 1218, chol 133, tg 116, hdl 45, ldl-c 65  From 5/13 showed   gluc 105, cr 0.55, gfr 102, alt 8,  hba1c 6.2,                   chol 148, tg 107, hdl 45, ldl-c 82,  wbc 5.0, hb 12.5, plt 172, vit d 40.3  From 11/13 showed        hba1c 6.2, ldl-p 1888, chol 176, tg 155, hdl 49, ldl-c 86,  wbc 6.2, hb 12.3, plt 182, vit d 34.4   From 5/14 showed        hba1c 6.2,     chol 152, tg 157, hdl 39, ldl-c 82  From 1/15 showed   gluc 113, cr 0.68, gfr>60, alt 11, hba1c 6.2,     chol 146, tg 130, hdl 43, ldl-c 77  From 7/15 showed        hba1c 6.3,     chol 133, tg 124, hdl 39, ldl-c 69, wbc 6.3, hb 12.2, plt 193    Results for orders placed or performed during the hospital encounter of 04/11/15   CBC WITH AUTOMATED DIFF   Result Value Ref Range    WBC 5.7 4.6 - 13.2 K/uL    RBC 4.42 4.20 - 5.30 M/uL    HGB 13.2 12.0 - 16.0 g/dL    HCT 42.6 35.0 - 45.0 %    MCV 96.4 74.0 - 97.0 FL    MCH 29.9 24.0 - 34.0 PG    MCHC 31.0 31.0 - 37.0 g/dL    RDW 13.6 11.6 - 14.5 %    PLATELET 203 135 - 420 K/uL    MPV 10.0 9.2 - 11.8 FL    NEUTROPHILS 70 40 - 73 %    LYMPHOCYTES 21 21 - 52 %    MONOCYTES 8 3 - 10 %    EOSINOPHILS 1 0 - 5 %    BASOPHILS 0 0 - 2 %    ABS. NEUTROPHILS 4.0 1.8 - 8.0 K/UL    ABS. LYMPHOCYTES 1.2 0.9 - 3.6 K/UL    ABS. MONOCYTES 0.5 0.05 - 1.2 K/UL    ABS. EOSINOPHILS 0.1 0.0 - 0.4 K/UL    ABS. BASOPHILS 0.0 0.0 - 0.06 K/UL    DF AUTOMATED     LIPID PANEL   Result Value Ref Range    LIPID PROFILE  Cholesterol, total 126 <200 MG/DL    Triglyceride 125 <150 MG/DL    HDL Cholesterol 46 40 - 60 MG/DL    LDL, calculated 55 0 - 100 MG/DL    VLDL, calculated 25 MG/DL    CHOL/HDL Ratio 2.7 0 - 5.0     HEMOGLOBIN A1C WITH EAG   Result Value Ref Range    Hemoglobin A1c 6.3 (H) 4.2 - 5.6 %    Est. average glucose 983 mg/dL   METABOLIC PANEL, COMPREHENSIVE   Result Value Ref Range    Sodium 143 136 - 145 mmol/L    Potassium 3.9 3.5 - 5.5 mmol/L    Chloride 106 100 - 108 mmol/L    CO2 28 21 - 32 mmol/L    Anion gap 9 3.0 - 18 mmol/L    Glucose 106 (H) 74 - 99 mg/dL    BUN 11 7.0 - 18 MG/DL    Creatinine 0.74 0.6 - 1.3 MG/DL    BUN/Creatinine ratio 15 12 - 20      GFR est AA >60 >60 ml/min/1.56m    GFR est non-AA >60 >60 ml/min/1.75m   Calcium 8.5 8.5 - 10.1 MG/DL    Bilirubin, total 0.4 0.2 - 1.0 MG/DL    ALT 26 13 - 56 U/L     AST 20 15 - 37 U/L    Alk. phosphatase 94 45 - 117 U/L    Protein, total 7.2 6.4 - 8.2 g/dL    Albumin 3.8 3.4 - 5.0 g/dL    Globulin 3.4 2.0 - 4.0 g/dL    A-G Ratio 1.1 0.8 - 1.7     TSH 3RD GENERATION   Result Value Ref Range    TSH 1.69 0.36 - 3.74 uIU/mL   VITAMIN D, 25 HYDROXY   Result Value Ref Range    Vitamin D 25-Hydroxy 79.2 30 - 100 ng/mL     Patient Active Problem List   Diagnosis Code   ??? Vitamin D deficiency E55.9   ??? Anxiety F41.9   ??? Dyslipidemia E78.5   ??? 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 Overweight (BMI 25.0-29.9) E66.3   ??? Prediabetes R73.09     Assessment and plan:  1. Prediabetes. Lifestyle and dietary modification reiterated. Weight loss would be ideal.  Another long discussion about typical progression of preDM  2. Hyperlipidemia. Continue current  3. Hypovitaminosis D. Continue supp  4. Anxiety.  Continue celexa  5. Overweight.  Dec app suppressants in past, reiterated portion control and 150 min/week exercise  6. R/O sleep apnea.  sched Dr ShManuella Ghazi7. Dyspepsia. PPI and avoidance measures        RTC 6/17    Above conditions discussed at length and patient vocalized understanding.  All questions answered to patient satifaction

## 2015-04-18 ENCOUNTER — Ambulatory Visit
Admit: 2015-04-18 | Discharge: 2015-04-18 | Payer: PRIVATE HEALTH INSURANCE | Attending: Internal Medicine | Primary: Internal Medicine

## 2015-04-18 DIAGNOSIS — Z0000 Encounter for general adult medical examination without abnormal findings: Secondary | ICD-10-CM

## 2015-04-18 NOTE — Patient Instructions (Signed)
Advance Directives: Care Instructions  Your Care Instructions  An advance directive is a legal way to state your wishes at the end of your life. It tells your family and your doctor what to do if you can no longer say what you want.  There are two main types of advance directives. You can change them any time that your wishes change.  ?? A living will tells your family and your doctor your wishes about life support and other treatment.  ?? A medical power of attorney lets you name a person to make treatment decisions for you when you can't speak for yourself. This person is called a health care agent.  If you do not have an advance directive, decisions about your medical care may be made by a doctor or a judge who doesn't know you.  It may help to think of an advance directive as a gift to the people who care for you. If you have one, they won't have to make tough decisions by themselves.  Follow-up care is a key part of your treatment and safety. Be sure to make and go to all appointments, and call your doctor if you are having problems. It's also a good idea to know your test results and keep a list of the medicines you take.  How can you care for yourself at home?  ?? Discuss your wishes with your loved ones and your doctor. This way, there are no surprises.  ?? Many states have a unique form. Or you might use a universal form that has been approved by many states. This kind of form can sometimes be completed and stored online. Your electronic copy will then be available wherever you have a connection to the Internet. In most cases, doctors will respect your wishes even if you have a form from a different state.  ?? You don't need a lawyer to do an advance directive. But you may want to get legal advice.  ?? Think about these questions when you prepare an advance directive:  ?? Who do you want to make decisions about your medical care if you are not  able to? Many people choose a family member, close friend, or doctor.  ?? Do you know enough about life support methods that might be used? If not, talk to your doctor so you understand.  ?? What are you most afraid of that might happen? You might be afraid of having pain, losing your independence, or being kept alive by machines.  ?? Where would you prefer to die? Choices include your home, a hospital, or a nursing home.  ?? Would you like to have information about hospice care to support you and your family?  ?? Do you want to donate organs when you die?  ?? Do you want certain religious practices performed before you die? If so, put your wishes in the advance directive.  ?? Read your advance directive every year, and make changes as needed.  When should you call for help?  Be sure to contact your doctor if you have any questions.  Where can you learn more?  Go to http://www.healthwise.net/[employer]Help[employer]ctions  Enter R264 in the search box to learn more about "Advance Directives: Care Instructions."  ?? 2006-2016 [employer]. Care instructions adapted under license by [employer] (which disclaims liability or warranty for this information). This care instruction is for use with your licensed healthcare professional. If you have questions about a medical condition or this instruction, always ask your   healthcare professional. Healthwise, Incorporated disclaims any warranty or liability for your use of this information.  Content Version: 10.9.538570; Current as of: December 19, 2014

## 2015-04-18 NOTE — Progress Notes (Signed)
1. Have you been to the ER, urgent care clinic or hospitalized since your last visit? NO.     2. Have you seen or consulted any other health care providers outside of the [facility] since your last visit (Include any pap smears or colon screening)? NO      Do you have an Advanced Directive? YES    Would you like information on Advanced Directives? NO

## 2015-04-19 ENCOUNTER — Encounter: Payer: Self-pay | Admitting: Neurology

## 2015-04-19 ENCOUNTER — Ambulatory Visit (INDEPENDENT_AMBULATORY_CARE_PROVIDER_SITE_OTHER): Payer: Medicare Other | Admitting: Neurology

## 2015-04-19 ENCOUNTER — Telehealth: Payer: Self-pay | Admitting: Internal Medicine

## 2015-04-19 VITALS — BP 134/77 | HR 72 | Ht 66.0 in | Wt 163.0 lb

## 2015-04-19 DIAGNOSIS — R269 Unspecified abnormalities of gait and mobility: Secondary | ICD-10-CM

## 2015-04-19 DIAGNOSIS — M797 Fibromyalgia: Secondary | ICD-10-CM | POA: Diagnosis not present

## 2015-04-19 DIAGNOSIS — R413 Other amnesia: Secondary | ICD-10-CM

## 2015-04-19 DIAGNOSIS — R197 Diarrhea, unspecified: Secondary | ICD-10-CM

## 2015-04-19 MED ORDER — VENLAFAXINE HCL ER 75 MG PO CP24: ORAL_CAPSULE | ORAL | Status: DC

## 2015-04-19 MED ORDER — METRONIDAZOLE 250 MG PO TABS: 250.0000 mg | ORAL_TABLET | Freq: Four times a day (QID) | ORAL | Status: DC

## 2015-04-19 MED ORDER — DONEPEZIL HCL 10 MG PO TABS: 10.0000 mg | ORAL_TABLET | Freq: Every day | ORAL | Status: DC

## 2015-04-19 NOTE — Telephone Encounter (Signed)
Left message for pt to call back.  Pt tested positive for cdiff in April. Pt states she was treated with Flagyl for 28 days. Reports last week she started having frequent diarrhea stools again like she had with cdiff and yesterday had fecal incontinence. Pt wants to know if she needs more antibiotics along with another stool test. Please advise.

## 2015-04-19 NOTE — Telephone Encounter (Signed)
Both. Resume metronidazole 250 mg 4 times a day 1 week. Stool for Clostridium difficile by PCR. We can always stop antibiotics if necessary. Also, we can always increase length of antibiotics therapy if positive. Thanks

## 2015-04-19 NOTE — Progress Notes (Signed)
Reason for visit: Memory disturbance  Mia [name] is an 64 y.o. female  History of present illness:  [name] is a 64 year old right-handed white female with a history of a progressive memory disturbance. The patient recently underwent a right total knee replacement, she is having some issues with balance, and she wears a knee brace on the right. The patient indicates that the right knee may collapse at times, she will fall on occasion, but she does not use a cane for ambulation. The patient has fibromyalgia, she uses Effexor for this with good benefit. Lyrica offered excellent pain control, but she could not tolerate the drug. The patient has had an episode C. difficile colitis, she required antibiotics for this. She has recently been diagnosed with osteoporosis. She indicates that the memory continues to progress, she is having increasing problems with word finding, short-term memory, repeating herself, keeping up with medications. The patient operates a motor vehicle, and she has not had any significant issues with this. She has to take a lot of notes to keep up with appointments. She has a reminder application on her telephone to help her remember to take some of her medications. The patient indicates that she did have formal neuropsychological evaluation in 2012. She has been on disability since that time. She is on Aricept, tolerating the medication well.  Past Medical History  Diagnosis Date  . SVT (supraventricular tachycardia)   . Sleep apnea     a. on cpap.  . Raynaud phenomenon   . Anemia   . Arthritis   . Blood transfusion   . Diabetes mellitus   . GERD (gastroesophageal reflux disease)     gastritis  . Hypertension   . Neuromuscular disorder     sclerosis  . Pseudogout   . Thyroid disease     hypothyroidism  . Hemorrhoids   . Rectal bleeding   . PONV (postoperative nausea and vomiting)   . Memory difficulty 12/14/2013  . Fibromyalgia   . Anal fissure   . Gall stones    . Interstitial cystitis   . Chronic Dyspnea     a. 01/2013 Echo: EF 60-65%, Gr 1 DD, PASP 39mmHg.  . Chest pain     a. 01/2013 MV: EF 59%, no ischemia.  . Peptic ulcer   . C. difficile colitis   . Osteoporosis     Past Surgical History  Procedure Laterality Date  . Cholecystectomy    . Appendectomy    . Tonsillectomy    . Hysterectomy - unknown type    . Knee surgery      x6  . Oophorectomy    . Dilation and curettage of uterus    . Breast biopsy    . Shoulder surgery      bilateral- bones spur  . Neck surgery      fusion  . Quadriceps repair Right   . Bladder surgery      x2  . Cataract extraction Bilateral   . Rectocele repair      x2  . Enterocele repair      x2  . Cardiac catheterization    . Eye surgery      retina  . Shoulder arthroscopy with subacromial decompression, rotator cuff repair and bicep tendon repair  10/06/2012    Procedure: SHOULDER ARTHROSCOPY WITH SUBACROMIAL DECOMPRESSION, ROTATOR CUFF REPAIR AND BICEP TENDON REPAIR;  Surgeon: Nita Sells, MD;  Location: [facility];  Service: Orthopedics;  Laterality: Right;  Arthroscopic  Repair  of  Subscapularis, Open Biceps Tenodesis  . Bladder surgery    . Total knee arthroplasty      Family History  Problem Relation Age of Onset  . Heart attack Mother   . Stroke Mother   . Diabetes Mother   . Breast cancer Maternal Grandmother   . Pancreatic cancer Maternal Grandfather   . Heart disease Mother   . Heart disease Father   . Motor neuron disease Sister   . Stroke Sister   . Lupus Sister   . Asthma Sister   . Colon polyps Mother   . Colon polyps Sister   . Heart attack Father   . Irritable bowel syndrome Sister   . Wilson's disease Maternal Grandmother     Social history:  reports that she has never smoked. She has never used smokeless tobacco. She reports that she does not drink alcohol or use illicit drugs.    Allergies  Allergen Reactions  . Percocet  [Oxycodone-Acetaminophen] Nausea And Vomiting  . Celecoxib Other (See Comments)    Other reaction(s): Other flushed  . Codeine Nausea And Vomiting  . Darvocet [Propoxyphene N-Acetaminophen] Nausea And Vomiting  . Erythromycin Nausea And Vomiting    Nausea and vomiting   . Hydrocodone Nausea And Vomiting  . Ketorolac Tromethamine Nausea And Vomiting  . Nitrofurantoin Nausea And Vomiting  . Oxycodone-Acetaminophen Nausea And Vomiting  . Oxycodone-Acetaminophen Nausea And Vomiting    Nausea and vomiting   . Pregabalin Swelling    Legs swelling   . Tramadol Nausea And Vomiting  . Verapamil Nausea And Vomiting    REACTION: intolerance    Medications:  Prior to Admission medications   Medication Sig Start Date End Date Taking? Authorizing Provider  alendronate (FOSAMAX) 70 MG tablet Take 1 tablet (70 mg total) by mouth every 7 (seven) days. Take with a full glass of water on an empty stomach. 04/15/15  Yes Hali Marry, MD  atorvastatin (LIPITOR) 40 MG tablet Take 1 tablet (40 mg total) by mouth daily. 12/28/14  Yes Thayer Headings, MD  calcium carbonate (OS-CAL) 600 MG TABS Take 600 mg by mouth daily with breakfast.    Yes Historical Provider, MD  cetirizine (ZYRTEC) 10 MG tablet Take 10 mg by mouth as needed for allergies.   Yes Historical Provider, MD  donepezil (ARICEPT) 10 MG tablet TAKE 1 TABLET BY MOUTH AT BEDTIME 04/14/15  Yes Shibata Givens, NP  fluticasone (FLONASE) 50 MCG/ACT nasal spray Place into both nostrils as needed for allergies or rhinitis.   Yes Historical Provider, MD  furosemide (LASIX) 20 MG tablet Take 1 tablet (20 mg total) by mouth daily. 12/21/14  Yes Hali Marry, MD  ipratropium (ATROVENT) 0.03 % nasal spray Place 2 sprays into both nostrils every 12 (twelve) hours. 04/12/15  Yes Hali Marry, MD  leflunomide (ARAVA) 20 MG tablet Take 1 tablet by mouth daily. 12/17/14  Yes Historical Provider, MD  Magnesium 250 MG TABS Take 250 mg by mouth 3  (three) times daily.   Yes Historical Provider, MD  metFORMIN (GLUCOPHAGE) 500 MG tablet Take 1 tablet (500 mg total) by mouth 2 (two) times daily with a meal. 03/13/14  Yes Hali Marry, MD  metoprolol (LOPRESSOR) 50 MG tablet Take 1 tablet (50 mg total) by mouth 2 (two) times daily. 11/22/14  Yes Rogelia Mire, NP  NON FORMULARY Place into the nose at bedtime. cpap machine   Yes Historical Provider, MD  pantoprazole (  PROTONIX) 40 MG tablet Take 1 tablet (40 mg total) by mouth 2 (two) times daily. 12/21/14  Yes Hali Marry, MD  propranolol (INDERAL) 10 MG tablet Take 10 mg by mouth 3 (three) times daily as needed.    Yes Historical Provider, MD  venlafaxine XR (EFFEXOR-XR) 75 MG 24 hr capsule TAKE 1 CAPSULE (75 MG TOTAL) BY MOUTH DAILY WITH BREAKFAST. 01/26/15  Yes Kathrynn Ducking, MD  vitamin D, CHOLECALCIFEROL, 400 UNITS tablet Take 400 Units by mouth daily.    Yes Historical Provider, MD    ROS:  Out of a complete 14 system review of symptoms, the patient complains only of the following symptoms, and all other reviewed systems are negative.  Excessive sweating Runny nose Diarrhea Sleep apnea Environmental allergies Incontinence of bladder, frequency of urination Joint pain, joint swelling, aching muscles, walking difficulty Memory loss, tremors  Blood pressure 134/77, pulse 72, height 5\' 6"  (1.676 m), weight 163 lb (73.936 kg).  Physical Exam  General: The patient is alert and cooperative at the time of the examination.  Skin: No significant peripheral edema is noted.   Neurologic Exam  Mental status: The patient is alert and oriented x 2 at the time of the examination (patient is not oriented to date). The patient has apparent normal recent and remote memory, with an apparently normal attention span and concentration ability. Mini-Mental Status Examination done today shows a total score 28/30. The patient is able to name 6 animals in 30 seconds.   Cranial  nerves: Facial symmetry is present. Speech is normal, no aphasia or dysarthria is noted. Extraocular movements are full. Visual fields are full.  Motor: The patient has good strength in all 4 extremities.  Sensory examination: Soft touch sensation is symmetric on the face, arms, and legs.  Coordination: The patient has good finger-nose-finger and heel-to-shin bilaterally.  Gait and station: The patient has a slightly wide-based gait. Tandem gait is unsteady. Romberg is negative. No drift is seen.  Reflexes: Deep tendon reflexes are symmetric, with exception of the right knee jerk reflexes depressed.   Assessment/Plan:  1. Memory disturbance  2. Gait disturbance  3. Fibromyalgia  The patient will continue the Aricept for now. She appears to be interested in Alzheimer's research, I will have our research coordinator call her. Prescriptions were given for the Aricept and for Effexor. She will follow-up in 6 months.  Jill Alexanders MD 04/19/2015 4:45 PM  [facility] [address] [place],  [address]  Phone [phone] Fax [phone]

## 2015-04-19 NOTE — Patient Instructions (Signed)

## 2015-04-19 NOTE — Telephone Encounter (Signed)
Pt aware and will come for lab Monday, script sent to pharmacy and order in epic.

## 2015-04-22 ENCOUNTER — Other Ambulatory Visit: Payer: Medicare Other

## 2015-04-22 ENCOUNTER — Ambulatory Visit: Payer: Medicare Other | Admitting: Family Medicine

## 2015-04-22 DIAGNOSIS — S8392XA Sprain of unspecified site of left knee, initial encounter: Secondary | ICD-10-CM | POA: Diagnosis not present

## 2015-04-22 DIAGNOSIS — Z96651 Presence of right artificial knee joint: Secondary | ICD-10-CM | POA: Diagnosis not present

## 2015-04-22 DIAGNOSIS — R197 Diarrhea, unspecified: Secondary | ICD-10-CM

## 2015-04-22 DIAGNOSIS — M25562 Pain in left knee: Secondary | ICD-10-CM | POA: Diagnosis not present

## 2015-04-22 NOTE — Telephone Encounter (Signed)
Pt informed.Mia [name]  

## 2015-04-23 LAB — CLOSTRIDIUM DIFFICILE BY PCR: Toxigenic C. Difficile by PCR: NOT DETECTED

## 2015-04-25 DIAGNOSIS — M25562 Pain in left knee: Secondary | ICD-10-CM | POA: Diagnosis not present

## 2015-04-25 DIAGNOSIS — M25462 Effusion, left knee: Secondary | ICD-10-CM | POA: Diagnosis not present

## 2015-05-03 DIAGNOSIS — M25562 Pain in left knee: Secondary | ICD-10-CM | POA: Diagnosis not present

## 2015-05-03 DIAGNOSIS — Z96651 Presence of right artificial knee joint: Secondary | ICD-10-CM | POA: Diagnosis not present

## 2015-05-08 ENCOUNTER — Other Ambulatory Visit: Payer: Self-pay | Admitting: Family Medicine

## 2015-05-23 DIAGNOSIS — A047 Enterocolitis due to Clostridium difficile: Secondary | ICD-10-CM | POA: Diagnosis not present

## 2015-05-23 DIAGNOSIS — R1013 Epigastric pain: Secondary | ICD-10-CM | POA: Diagnosis not present

## 2015-06-04 ENCOUNTER — Other Ambulatory Visit: Payer: Self-pay

## 2015-06-04 MED ORDER — ATORVASTATIN CALCIUM 40 MG PO TABS: 40.0000 mg | ORAL_TABLET | Freq: Every day | ORAL | Status: DC

## 2015-06-05 ENCOUNTER — Other Ambulatory Visit: Payer: Self-pay | Admitting: *Deleted

## 2015-06-05 MED ORDER — METOPROLOL TARTRATE 50 MG PO TABS: 50.0000 mg | ORAL_TABLET | Freq: Two times a day (BID) | ORAL | Status: DC

## 2015-06-05 MED ORDER — VENLAFAXINE HCL ER 75 MG PO CP24: ORAL_CAPSULE | ORAL | Status: DC

## 2015-06-05 MED ORDER — FUROSEMIDE 20 MG PO TABS: 20.0000 mg | ORAL_TABLET | Freq: Every day | ORAL | Status: DC

## 2015-06-07 ENCOUNTER — Other Ambulatory Visit: Payer: Self-pay

## 2015-06-07 MED ORDER — PANTOPRAZOLE SODIUM 40 MG PO TBEC: 40.0000 mg | DELAYED_RELEASE_TABLET | Freq: Two times a day (BID) | ORAL | Status: DC

## 2015-06-11 DIAGNOSIS — Z79899 Other long term (current) drug therapy: Secondary | ICD-10-CM | POA: Diagnosis not present

## 2015-06-24 DIAGNOSIS — A047 Enterocolitis due to Clostridium difficile: Secondary | ICD-10-CM | POA: Diagnosis not present

## 2015-06-27 ENCOUNTER — Encounter: Payer: Self-pay | Admitting: Cardiovascular Disease

## 2015-06-27 ENCOUNTER — Other Ambulatory Visit: Payer: Self-pay | Admitting: Family Medicine

## 2015-06-27 ENCOUNTER — Other Ambulatory Visit: Payer: Self-pay

## 2015-06-27 ENCOUNTER — Ambulatory Visit (INDEPENDENT_AMBULATORY_CARE_PROVIDER_SITE_OTHER): Payer: Medicare Other | Admitting: Cardiovascular Disease

## 2015-06-27 VITALS — BP 126/74 | HR 70 | Ht 66.0 in | Wt 165.4 lb

## 2015-06-27 DIAGNOSIS — E785 Hyperlipidemia, unspecified: Secondary | ICD-10-CM

## 2015-06-27 DIAGNOSIS — R0789 Other chest pain: Secondary | ICD-10-CM

## 2015-06-27 DIAGNOSIS — R0602 Shortness of breath: Secondary | ICD-10-CM

## 2015-06-27 MED ORDER — DONEPEZIL HCL 10 MG PO TABS: 10.0000 mg | ORAL_TABLET | Freq: Every day | ORAL | Status: DC

## 2015-06-27 MED ORDER — METFORMIN HCL 500 MG PO TABS: ORAL_TABLET | ORAL | Status: DC

## 2015-06-27 NOTE — Patient Instructions (Addendum)
Medication Instructions:  Your physician recommends that you continue on your current medications as directed. Please refer to the Current Medication list given to you today.   Labwork: Your physician recommends that you return for lab work in: 1 year on the day of or a few days before your office visit with Dr. Nahser.  You will need to FAST for this appointment - nothing to eat or drink after midnight the night before except water.    Testing/Procedures: None Ordered   Follow-Up: Your physician wants you to follow-up in: 1 year with Dr. Nahser.  You will receive a reminder letter in the mail two months in advance. If you don't receive a letter, please call our office to schedule the follow-up appointment.    

## 2015-06-27 NOTE — Telephone Encounter (Signed)
Requested 90day to mail order pharmacy.

## 2015-06-27 NOTE — Progress Notes (Signed)
Cardiology Office Note   Date:  06/27/2015   ID:  Mia [name], DOB [birth year], MRN [id number]  PCP:  [name], MD  Cardiologist:   Thayer Headings, MD   Chief Complaint  Patient presents with  . Follow-up   1. SVT 2. Raynaud's Phenomenon 3. Diabetes Mellitus 4. Minimal CAD by cath 2006 [name]) 5. Sleep apnea - CPAP is currently not working  02/11/05 Right heart catheterization:  RA pressure: 8 mmHg  RV pressure: 25/7 mmHg  PA pressure: 20/9 mmHg  PWCP: 9 mmHg  CO: 4.6 liters per minute  LEFT HEART CATHETERIZATION RESULTS:  1. Left main: No significant disease.  2. LAD: Mild luminal irregularities.  3. First and second diagonal: Moderate size, mild luminal irregularities.  4. Left circumflex: Nondominant, mild luminal irregularities.  5. RCA: Dominant with mild luminal irregularities.  6. LV: EF is 55 to 60%, no wall motion abnormalities. LV EDP was 9 mmHg.   December 28, 2014:  Mia [name] is a 64 y.o. female who presents for follow-up with an episode of chest pain. She was seen by our nurse practitioner, Ignacia Bayley.    Diltiazem was DC'd.   She was started on Metoprolol 50 bid.   Myoview study was normal. She has normal left ventricle systolic function. She is going to the [facility] 3 times a week.  Doing well.     She is feeling much better.    Sept. 1, 2016:  Has had C diff for the past several months .  Doing well from a cardiac standpoint .  Has been fatigued.  Doing well with the metoprolol  Still has shortness of breath .    Past Medical History  Diagnosis Date  . SVT (supraventricular tachycardia)   . Sleep apnea     a. on cpap.  . Raynaud phenomenon   . Anemia   . Arthritis   . Blood transfusion   . Diabetes mellitus   . GERD (gastroesophageal reflux disease)     gastritis  . Hypertension   . Neuromuscular disorder     sclerosis  . Pseudogout   . Thyroid disease     hypothyroidism  . Hemorrhoids   . Rectal bleeding     . PONV (postoperative nausea and vomiting)   . Memory difficulty 12/14/2013  . Fibromyalgia   . Anal fissure   . Gall stones   . Interstitial cystitis   . Chronic Dyspnea     a. 01/2013 Echo: EF 60-65%, Gr 1 DD, PASP 72mmHg.  . Chest pain     a. 01/2013 MV: EF 59%, no ischemia.  . Peptic ulcer   . C. difficile colitis   . Osteoporosis     Past Surgical History  Procedure Laterality Date  . Cholecystectomy    . Appendectomy    . Tonsillectomy    . Hysterectomy - unknown type    . Knee surgery      x6  . Oophorectomy    . Dilation and curettage of uterus    . Breast biopsy    . Shoulder surgery      bilateral- bones spur  . Neck surgery      fusion  . Quadriceps repair Right   . Bladder surgery      x2  . Cataract extraction Bilateral   . Rectocele repair      x2  . Enterocele repair      x2  . Cardiac catheterization    .  Eye surgery      retina  . Shoulder arthroscopy with subacromial decompression, rotator cuff repair and bicep tendon repair  10/06/2012    Procedure: SHOULDER ARTHROSCOPY WITH SUBACROMIAL DECOMPRESSION, ROTATOR CUFF REPAIR AND BICEP TENDON REPAIR;  Surgeon: Nita Sells, MD;  Location: [facility];  Service: Orthopedics;  Laterality: Right;  Arthroscopic  Repair  of  Subscapularis, Open Biceps Tenodesis  . Bladder surgery    . Total knee arthroplasty       Current Outpatient Prescriptions  Medication Sig Dispense Refill  . alendronate (FOSAMAX) 70 MG tablet Take 1 tablet (70 mg total) by mouth every 7 (seven) days. Take with a full glass of water on an empty stomach. 12 tablet 3  . atorvastatin (LIPITOR) 40 MG tablet Take 1 tablet (40 mg total) by mouth daily. 90 tablet 0  . calcium carbonate (OS-CAL) 600 MG TABS Take 600 mg by mouth daily with breakfast.     . cetirizine (ZYRTEC) 10 MG tablet Take 10 mg by mouth as needed for allergies.    . Dexlansoprazole 30 MG capsule Take 60 mg by mouth daily.    Marland Kitchen donepezil  (ARICEPT) 10 MG tablet Take 1 tablet (10 mg total) by mouth at bedtime. 90 tablet 1  . fluticasone (FLONASE) 50 MCG/ACT nasal spray Place into both nostrils as needed for allergies or rhinitis.    . furosemide (LASIX) 20 MG tablet Take 1 tablet (20 mg total) by mouth daily. 90 tablet 1  . ipratropium (ATROVENT) 0.03 % nasal spray Place 2 sprays into both nostrils every 12 (twelve) hours. 30 mL 12  . leflunomide (ARAVA) 20 MG tablet Take 1 tablet by mouth daily.  2  . metFORMIN (GLUCOPHAGE) 500 MG tablet TAKE 1 TABLET BY MOUTH TWICE DAILY WITH MEAL 60 tablet 2  . metoprolol (LOPRESSOR) 50 MG tablet Take 1 tablet (50 mg total) by mouth 2 (two) times daily. 180 tablet 1  . NON FORMULARY Place into the nose at bedtime. cpap machine    . propranolol (INDERAL) 10 MG tablet Take 10 mg by mouth 3 (three) times daily as needed.     . vancomycin (VANCOCIN) 10 G SOLR injection     . venlafaxine XR (EFFEXOR-XR) 75 MG 24 hr capsule TAKE 1 CAPSULE (75 MG TOTAL) BY MOUTH DAILY WITH BREAKFAST. 90 capsule 1  . vitamin D, CHOLECALCIFEROL, 400 UNITS tablet Take 400 Units by mouth daily.      No current facility-administered medications for this visit.    Allergies:   Percocet; Celecoxib; Codeine; Darvocet; Erythromycin; Hydrocodone; Ketorolac tromethamine; Nitrofurantoin; Oxycodone-acetaminophen; Oxycodone-acetaminophen; Pregabalin; Tramadol; and Verapamil    Social History:  The patient  reports that she has never smoked. She has never used smokeless tobacco. She reports that she does not drink alcohol or use illicit drugs.   Family History:  The patient's family history includes Asthma in her sister; Breast cancer in her maternal grandmother; Colon polyps in her mother and sister; Diabetes in her mother; Heart attack in her father and mother; Heart disease in her father and mother; Irritable bowel syndrome in her sister; Lupus in her sister; Motor neuron disease in her sister; Pancreatic cancer in her maternal  grandfather; Stroke in her mother and sister; Wilson's disease in her maternal grandmother.    ROS:  Please see the history of present illness.    Review of Systems: Constitutional:  denies fever, chills, diaphoresis, appetite change and fatigue.  HEENT: denies photophobia, eye pain, redness, hearing  loss, ear pain, congestion, sore throat, rhinorrhea, sneezing, neck pain, neck stiffness and tinnitus.  Respiratory: denies SOB, DOE, cough, chest tightness, and wheezing.  Cardiovascular: denies chest pain, palpitations and leg swelling.  Gastrointestinal: denies nausea, vomiting, abdominal pain, diarrhea, constipation, blood in stool.  Genitourinary: denies dysuria, urgency, frequency, hematuria, flank pain and difficulty urinating.  Musculoskeletal: denies  myalgias, back pain, joint swelling, arthralgias and gait problem.   Skin: denies pallor, rash and wound.  Neurological: denies dizziness, seizures, syncope, weakness, light-headedness, numbness and headaches.   Hematological: denies adenopathy, easy bruising, personal or family bleeding history.  Psychiatric/ Behavioral: denies suicidal ideation, mood changes, confusion, nervousness, sleep disturbance and agitation.       All other systems are reviewed and negative.    PHYSICAL EXAM: VS:  BP 126/74 mmHg  Pulse 70  Ht 5\' 6"  (1.676 m)  Wt 75.025 kg (165 lb 6.4 oz)  BMI 26.71 kg/m2  SpO2 97% , BMI Body mass index is 26.71 kg/(m^2). GEN: Well nourished, well developed, in no acute distress HEENT: normal Neck: no JVD, carotid bruits, or masses Cardiac: RRR; no murmurs, rubs, or gallops,no edema  Respiratory:  clear to auscultation bilaterally, normal work of breathing GI: soft, nontender, nondistended, + BS MS: no deformity or atrophy Skin: warm and dry, no rash Neuro:  Strength and sensation are intact Psych: normal   EKG:  EKG is not ordered today.     Recent Labs: 07/31/2014: Hemoglobin 13.4; Platelets  447* 12/21/2014: TSH 0.819 04/03/2015: ALT 24; BUN 11; Creatinine, Ser 0.85; Potassium 3.9; Sodium 136    Lipid Panel    Component Value Date/Time   CHOL 136 04/03/2015 0814   TRIG 75.0 04/03/2015 0814   HDL 58.50 04/03/2015 0814   CHOLHDL 2 04/03/2015 0814   VLDL 15.0 04/03/2015 0814   LDLCALC 63 04/03/2015 0814      Wt Readings from Last 3 Encounters:  06/27/15 75.025 kg (165 lb 6.4 oz)  04/19/15 73.936 kg (163 lb)  04/01/15 74.39 kg (164 lb)      Other studies Reviewed: Additional studies/ records that were reviewed today include: . Review of the above records demonstrates:    ASSESSMENT AND PLAN:  1. SVT - seems to be fairly well-controlled   2. Raynaud's Phenomenon - she's had a tough time with Raynaud's phenomenon this past winter. She's now on metoprolol. We will need to watch her closely for any worsening of her Raynaud's.  3. Diabetes Mellitus  4. Minimal CAD by cath 2006 [name])  5. Sleep apnea - CPAP is currently not working  6. Dyspnea on exertion:   she continues to have dyspnea on exertion. Her echocardiogram and her Myoview study were normal.  She was found atelectasis by CT scan. I've encouraged her to take deep breaths. She may need to have an appointment with pulmonologist.  Current medicines are reviewed at length with the patient today.  The patient does not have concerns regarding medicines.  The following changes have been made:  no change  Disposition:   FU with me in 12  months     Nahser, Wonda Cheng, MD  06/27/2015 8:27 AM    [facility] [place], [place], [place]  [address] Phone: [phone]; Fax: [phone]

## 2015-07-15 DIAGNOSIS — K648 Other hemorrhoids: Secondary | ICD-10-CM | POA: Diagnosis not present

## 2015-07-15 DIAGNOSIS — R1084 Generalized abdominal pain: Secondary | ICD-10-CM | POA: Diagnosis not present

## 2015-07-15 DIAGNOSIS — A047 Enterocolitis due to Clostridium difficile: Secondary | ICD-10-CM | POA: Diagnosis not present

## 2015-07-15 DIAGNOSIS — R197 Diarrhea, unspecified: Secondary | ICD-10-CM | POA: Diagnosis not present

## 2015-07-16 NOTE — Telephone Encounter (Signed)
If you have a cpap or bipap, bring it with you to tomorrows appointment.

## 2015-07-17 ENCOUNTER — Other Ambulatory Visit: Payer: Self-pay | Admitting: *Deleted

## 2015-07-17 ENCOUNTER — Ambulatory Visit
Admit: 2015-07-17 | Discharge: 2015-07-17 | Payer: PRIVATE HEALTH INSURANCE | Attending: Pulmonary Disease | Primary: Internal Medicine

## 2015-07-17 ENCOUNTER — Ambulatory Visit: Attending: Pulmonary Disease | Primary: Internal Medicine

## 2015-07-17 DIAGNOSIS — G4730 Sleep apnea, unspecified: Secondary | ICD-10-CM

## 2015-07-17 DIAGNOSIS — K5730 Diverticulosis of large intestine without perforation or abscess without bleeding: Secondary | ICD-10-CM | POA: Diagnosis not present

## 2015-07-17 DIAGNOSIS — N281 Cyst of kidney, acquired: Secondary | ICD-10-CM | POA: Diagnosis not present

## 2015-07-17 MED ORDER — IPRATROPIUM BROMIDE 0.03 % NA SOLN: 2.0000 | Freq: Two times a day (BID) | NASAL | Status: DC

## 2015-07-17 MED ORDER — ALENDRONATE SODIUM 70 MG PO TABS: 70.0000 mg | ORAL_TABLET | ORAL | Status: DC

## 2015-07-17 NOTE — Progress Notes (Signed)
[facility]      Lahoma Rocker, M.D.  Pulmonary Critical Care & Sleep Medicine   [place], [place]  Office [phone]  Pager: [place] Initial Consultation    Name: Laura Roman     DOB: April 29, 1951     Date: 07/17/2015        Reason for visit: Evaluation of sleep apnea home sleep study is ordered    Patient is a 64 year old female, consult requested from Dr. Ky Barban for evaluation of sleep apnea. Briefly, patient is a very pleasant 64 year old female, who has been referred by Dr. Ky Barban for evaluation of sleep apnea because of history of snoring, history of snore witnessed arousals, history of feeling fatigued and tired.  She was initially referred and she never followed up and subsequent to that she continued to have symptoms and she was referred back to our office.    Neck circumference is 15.  Weight is 166.  Currently she works in an office doing office work.    Sleep pattern includes bedtime, going to bed at 12:00, wakes up at 8:00. On the weekend goes to bed around 12:00, wakes up at 8:00.  Usually takes 20-30 minutes to go to sleep.  Usually wakes up twice at nighttime to go to the bathroom.  No problem going back to sleep. No history of any headaches.  Usually wakes up naturally.      Sleep hygiene point of view:  She does have regular sleep/wake time, however she goes to sleep very late.  She does watch TV or work on a computer before going to sleep.  She does look at a clock when she does not get to sleep at nighttime.  Does drink decaffeinated coffee, decaffeinated tea.  Does eat chocolates.  Does drink alcohol.  Denies smoking or recreational drug use.    No history of any heart attack, COPD, stroke, heart failure or tonsillectomy.    In regard to parasomnia, RLS or narcolepsy history point of view, no history of any sleepwalking, sleeptalking, bedwetting, teeth grinding or  nightmares. For the last one year or so she does have some symptoms of uneasiness in the leg, which wakes her up at nighttime.  No history of any cataplexy, no history of auditory or visual hallucinations.      From excess sleepiness point of view, does feel sleepy during the daytime, does not take nap during the daytime.  No history of any drowsy driving or accidents.  Current [place] sleepiness is 7/24.    Assessment:  1. Evaluation for sleep apnea.  2. Snoring, choking arousals, witnessed arousals, suggest possibility of sleep apnea.  3. Questionable early RLS or RLS related to sleep deprivation.    Discussion:  First and foremost, as patient does have history or symptoms suggestive of snoring, daytime fatigue and snore arousals, she needs to be evaluated for sleep apnea.  As patient does not have any comorbidities and RLS symptoms occur far less frequent, I would consider doing a home sleep study on the patient.    If the home sleep study is inconclusive, patient will benefit from in house sleep study.    I will see the patient back in two months with the home sleep study results and determine further course of action.    Dental appliance, Provent, CPAP therapy all have been discussed with the patient at  length.    CAlbin Felling, MD         Prior/old records reviewed and discussed with patient.  Labs, Images and available PFT and sleep study discussed with patient. Labs and images personally seen and available reports reviewed with patient.  All current medicines are reviewed and doses and prescription adjusted.  Plan of care discussed/reported to primary physician.  Plan of care discussed with patient  HIM care and advance directive per PCP  Pathophysiology, severity, risk factors, association to cardiovascular morbidities and excessive daytime sleepiness, consequences of untreated sleep apnea were discussed with the patient  Treatment options including CPAP, dental appliance, weight reduction  measures, positional therapy, surgeries etc were discussed  Safe driving and operating machineries practices were advised    Past Medical History   Diagnosis Date   ??? Allergic rhinitis    ??? Anxiety state, unspecified    ??? Arthritis      Dr. Synetta Shadow, Dr. Marisa Hua   ??? Compression fx, thoracic spine [facility]) 2007     negative DEXA Dr. Marisa Hua   ??? Dyslipidemia    ??? Dyspepsia    ??? Frozen shoulder      right Dr. Wynonia Hazard MRI   ??? Multiple lung nodules      no change 10/06, 03/07, 03/08   ??? Overweight (BMI 25.0-29.9)    ??? Palpitations 2008     neg thallium 2008, nl holter 2008, echo nl lv/ef 65%/tr mr/dd/nl pasp   ??? Prediabetes    ??? 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 Venous insufficiency    ??? Vitamin D deficiency        Past Surgical History   Procedure Laterality Date   ??? Hx hemorrhoidectomy       Dr. Rosendo Gros 2007   ??? Echo 2d adult  12/04     normal with EF 70%   ??? Stress test thallium study  2005     negative   ??? Vas carotid duplex bilateral  2004     negative   ??? [place] abd comp  9/05     negative   ??? Hx gyn       s/p BTL    ??? Hx colonoscopy       Dr Rosendo Gros 2007 negative       Social History     Social History   ??? Marital status: MARRIED     Spouse name: N/A   ??? Number of children: 2   ??? 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 Smoking status: Never Smoker   ??? Smokeless tobacco: Not on file   ??? Alcohol use 2.4 oz/week     4 Glasses of wine per week   ??? Drug use: No   ??? Sexual activity: Not on file     Other Topics Concern   ??? Not on file     Social History Narrative       Family History   Problem Relation Age of Onset   ??? Diabetes Mother    ??? Elevated Lipids Father    ??? Stroke Paternal Grandmother    ??? Heart Disease Paternal Grandfather        Allergies   Allergen Reactions   ??? Fish Oil Nausea Only   ??? Relafen [Nabumetone] Nausea Only       .  Current Outpatient Prescriptions   Medication Sig Dispense Refill    ??? omega-3 fatty acids (FISH OIL) cap Take 2 Tabs by mouth daily.     ??? multivitamin (ONE A DAY) tablet Take 1 Tab by mouth daily.     ??? aspirin 81 mg chewable tablet Take 81 mg by mouth daily.     ??? atorvastatin (LIPITOR) 40 mg tablet take 1 tablet by mouth daily 90 Tab 3   ??? citalopram (CELEXA) 10 mg tablet take 1 tablet by mouth once daily 90 Tab 3   ??? VITAMIN D2 50,000 unit capsule take 1 capsule by mouth every week 13 Cap 3   ??? lansoprazole (PREVACID) 30 mg capsule Take 1 Cap by mouth Daily (before breakfast). 90 Cap 3         Review of Systems:  HEENT: No epistaxis, no nasal drainage, no difficulty in swallowing, no redness in eyes  Respiratory: No cough sob or wheezing  Sleep: As above  Cardiovascular: no chest pain, no palpitations, no chronic leg edema, no syncope  Gastrointestinal: no abd pain, no vomiting, no diarrhea, no bleeding symptoms  Genitourinary: No urinary symptoms or hematuria  Integument/breast: No ulcers or rashes  Musculoskeletal:Neg  Neurological: No focal weakness, no seizures, no headaches  Behvioral/Psych: No anxiety, no depression  Constitutional: No fever, no chills, no weight loss, no night sweats     Objective:     Visit Vitals   ??? Ht 5' 4" (1.626 m)   ??? Wt 75.3 kg (166 lb)   ??? BMI 28.49 kg/m2        Physical Exam: Weight 166 ESS 7 Neck Cir 15,   General: comfortable, no acute distress  HEENT: pupils reactive, sclera anicteric, EOM intact, Malampati score 3,   Tongue: Macroglossia y Teeth indentation y  Chin: Micrognathia y  Neck: No adenopathy or thyroid swelling, no lymphadenopathy or JVD, supple  CVS: S1S2 no murmurs  RS: Mod AE bilaterally, no tactile fremitus or egophony, no accessory muscle use  Abd: soft, non tender, no hepatosplenomegaly  Neuro: non focal, awake, alert  Extrm: no leg edema, clubbing or cyanosis  Skin: no rash    Data review:     Chemistry No results for input(s): GLU, NA, K, CL, CO2, BUN, CREA, CA, MG,  PHOS, AGAP, BUCR, TBIL, GPT, AP, TP, ALB, GLOB, AGRAT in the last 72 hours.     Lactic Acid No results found for: LAC  No results for input(s): LAC in the last 72 hours.     Liver Enzymes PROTEIN, TOTAL   Date Value Ref Range Status   04/11/2015 7.2 6.4 - 8.2 g/dL Final     ALBUMIN   Date Value Ref Range Status   04/11/2015 3.8 3.4 - 5.0 g/dL Final     GLOBULIN   Date Value Ref Range Status   04/11/2015 3.4 2.0 - 4.0 g/dL Final     A-G RATIO   Date Value Ref Range Status   04/11/2015 1.1 0.8 - 1.7   Final     AST   Date Value Ref Range Status   04/11/2015 20 15 - 37 U/L Final     ALK. PHOSPHATASE   Date Value Ref Range Status   04/11/2015 94 45 - 117 U/L Final     No results for input(s): TP, ALB, GLOB, AGRAT, SGOT, GPT, AP, TBIL in the last 72 hours.    No lab exists for component: DBIL     CBC w/Diff No  results for input(s): WBC, RBC, HGB, HCT, PLT, GRANS, LYMPH, EOS, HGBEXT, HCTEXT, PLTEXT in the last 72 hours.     Cardiac Enzymes No results found for: CPK, CKMMB, CKMB, RCK3, CKMBT, CKNDX, CKND1, MYO, TROPT, TROIQ, TROI, TROPT, TNIPOC, BNP, BNPP     BNP No results found for: BNP, BNPP, XBNPT     Coagulation No results for input(s): PTP, INR, APTT in the last 72 hours.    No lab exists for component: INREXT      Thyroid  Lab Results   Component Value Date/Time    TSH 1.69 04/11/2015 08:46 AM          ABG No results for input(s): PHI, PHI, PCO2I, PO2, PO2I, HCO3, HCO3I, FIO2, FIO2I in the last 72 hours.    No lab exists for component: POC2     Urinalysis Lab Results   Component Value Date/Time    COLOR Yellow 06/16/2010 08:55 AM    APPEARANCE Clear 06/16/2010 08:55 AM    PH (UA) 6.0 06/16/2010 08:55 AM    KETONE Negative 06/16/2010 08:55 AM    BILIRUBIN Negative 06/16/2010 08:55 AM    NITRITES Negative 06/16/2010 08:55 AM    LEUKOCYTE ESTERASE Negative 06/16/2010 08:55 AM    BACTERIA Few 06/16/2010 08:55 AM    WBC 11-30 06/16/2010 08:55 AM    RBC 11-30 06/16/2010 08:55 AM         Micro  No results for input(s): SDES, CULT in the last 72 hours.  No results for input(s): CULT in the last 72 hours.     XR (Most Recent). CXR reviewed by me and compared with previous CXR No results found for this or any previous visit.     CT (Most Recent)   Results from Hospital Encounter encounter on 03/04/09   CT ABDMEN/PELVIS   Narrative Ordering MD: Albin Felling MD  Interpreted By: Cleophas Dunker MD  ** FINAL **  ---------------------------------------------------------------------  INTERPRETATION:  CT abdomen and pelvis without enhancement   CPT code: 59292 and [id number].  Indication: Right sided pain  Technique:5 mm collimation axial images obtained from the diaphragm   to the level of the pubic symphysis without nonionic intravenous   contrast.   COMPARISON: CT chest 01/21/07  ABDOMEN FINDINGS:  Lung Bases:Lungs are normal.  There is a small hiatal hernia.  Liver: Normal in attenuation without mass.  Gallbladder: Present.  No wall thickening, gallstone or ductal   dilitation..  Pancreas: Normal in attenuation without mass or ductal dilitation.  Spleen: Normal in attenuation and size without mass.  Adrenal Glands: No evidence for mass.  Kidneys:No cortical mass or hydronephrosis.  No evidence for renal   calculus.  Lymph Nodes:No lymphadenopathy.  Aorta is normal in diameter  PELVIS FINDINGS:   Bowel: Small bowel is normal in diameter with normal wall thickness.   Large bowel is normal in diameter with normal wall thickness.  No   evidence for mass..  Bladder:Normal.  Ureters: No ureteral dilatation or calculus.  Lymph Nodes:No lymphadenopathy.  No free fluid or fluid collection  Bones: There are degenerative changes of the spine.  No lytic or   blastic lesions  IMPRESSION:  1. No evidence for renal calculus. No evidence for obstructive   uropathy..  2. Small hiatal hernia.           EKG No results found for this or any previous visit.     ECHO No results found for this or any previous visit.  PFT No flowsheet data found.                    Drema Halon, MD  Pulmonary Critical Care & Sleep Medicine   [address], [place]  [phone]

## 2015-07-17 NOTE — Progress Notes (Signed)
Patient here for sleep evaluation.

## 2015-07-24 ENCOUNTER — Other Ambulatory Visit: Payer: Self-pay | Admitting: Family Medicine

## 2015-07-24 DIAGNOSIS — K5730 Diverticulosis of large intestine without perforation or abscess without bleeding: Secondary | ICD-10-CM | POA: Diagnosis not present

## 2015-07-24 DIAGNOSIS — A047 Enterocolitis due to Clostridium difficile: Secondary | ICD-10-CM | POA: Diagnosis not present

## 2015-07-24 DIAGNOSIS — R197 Diarrhea, unspecified: Secondary | ICD-10-CM | POA: Diagnosis not present

## 2015-07-25 ENCOUNTER — Ambulatory Visit (INDEPENDENT_AMBULATORY_CARE_PROVIDER_SITE_OTHER): Payer: Medicare Other | Admitting: Sports Medicine

## 2015-07-25 VITALS — BP 153/60 | HR 60 | Temp 98.0°F | Wt 165.0 lb

## 2015-07-25 DIAGNOSIS — Z23 Encounter for immunization: Secondary | ICD-10-CM | POA: Diagnosis not present

## 2015-07-25 NOTE — Progress Notes (Signed)
Patient came into clinic today for flu vaccination. Patient was given a flu shot questionnaire prior to administration of immunization. All questions were answered no. Patient tolerated injection of flu immunization in Left deltoid well, with no immediate complications. An information pamphlet regarding flu shot immunization and frequently asked questions was given to Patient prior to leaving clinic. Advised to contact our office with any questions/concerns.

## 2015-07-25 NOTE — Addendum Note (Signed)
Addended by: Huel Cote on: 07/25/2015 08:40 AM   Modules accepted: Level of Service

## 2015-07-29 DIAGNOSIS — Z79899 Other long term (current) drug therapy: Secondary | ICD-10-CM | POA: Diagnosis not present

## 2015-07-29 DIAGNOSIS — K219 Gastro-esophageal reflux disease without esophagitis: Secondary | ICD-10-CM | POA: Diagnosis not present

## 2015-07-29 DIAGNOSIS — Z7951 Long term (current) use of inhaled steroids: Secondary | ICD-10-CM | POA: Diagnosis not present

## 2015-07-29 DIAGNOSIS — E119 Type 2 diabetes mellitus without complications: Secondary | ICD-10-CM | POA: Diagnosis not present

## 2015-07-29 DIAGNOSIS — G43909 Migraine, unspecified, not intractable, without status migrainosus: Secondary | ICD-10-CM | POA: Diagnosis not present

## 2015-07-29 DIAGNOSIS — M069 Rheumatoid arthritis, unspecified: Secondary | ICD-10-CM | POA: Diagnosis not present

## 2015-07-29 DIAGNOSIS — G4730 Sleep apnea, unspecified: Secondary | ICD-10-CM | POA: Diagnosis not present

## 2015-07-29 DIAGNOSIS — Z7984 Long term (current) use of oral hypoglycemic drugs: Secondary | ICD-10-CM | POA: Diagnosis not present

## 2015-07-29 DIAGNOSIS — M797 Fibromyalgia: Secondary | ICD-10-CM | POA: Diagnosis not present

## 2015-07-29 DIAGNOSIS — F0390 Unspecified dementia without behavioral disturbance: Secondary | ICD-10-CM | POA: Diagnosis not present

## 2015-07-29 DIAGNOSIS — Z96651 Presence of right artificial knee joint: Secondary | ICD-10-CM | POA: Diagnosis not present

## 2015-07-29 DIAGNOSIS — I10 Essential (primary) hypertension: Secondary | ICD-10-CM | POA: Diagnosis not present

## 2015-07-29 DIAGNOSIS — Z885 Allergy status to narcotic agent status: Secondary | ICD-10-CM | POA: Diagnosis not present

## 2015-07-29 DIAGNOSIS — A047 Enterocolitis due to Clostridium difficile: Secondary | ICD-10-CM | POA: Diagnosis not present

## 2015-07-30 ENCOUNTER — Other Ambulatory Visit: Payer: Self-pay | Admitting: Cardiovascular Disease

## 2015-07-30 NOTE — Telephone Encounter (Signed)
Just wanted to clarify with you that the patient should no longer be taking this. Looks like it was removed from her list at her visit on 06/27/15. Please advise. Thanks, MI

## 2015-07-30 NOTE — Telephone Encounter (Signed)
Patient reported to [place] on 9/1 that she had switched to [facility]

## 2015-07-31 NOTE — Progress Notes (Signed)
I received a [facility] sleep study report from the patient.  The first night she spent 7 hours and 57 minutes with AHI remaining 4.  The second night she spent 7 hours and 27 minutes with AHI remaining 3.6.  The lowest oxygen saturation remained 75% and 82%.  Overall it was reported to be normal sleep study, however I think it is an inconclusive sleep study as patient did have some significant hypoxia noted during that particular sleep study.  Patient definitely had symptoms of it.  I have tried to call the patient back in the office to evaluate for excessive daytime sleepiness and discuss the sleep study results.  Unfortunately she could not come to the office today.  Will try to reschedule her appointment to discuss that further before we determine further course of action.    See scanned report in media        Lahoma Rocker, M.D.  Pulmonary Critical Care & Sleep Medicine   [address] [place]  Office no: [phone]  Pager: [phone]

## 2015-08-01 DIAGNOSIS — A047 Enterocolitis due to Clostridium difficile: Secondary | ICD-10-CM | POA: Diagnosis not present

## 2015-08-05 DIAGNOSIS — R197 Diarrhea, unspecified: Secondary | ICD-10-CM | POA: Diagnosis not present

## 2015-08-26 ENCOUNTER — Other Ambulatory Visit: Payer: Self-pay | Admitting: Neurology

## 2015-08-26 ENCOUNTER — Other Ambulatory Visit: Payer: Self-pay | Admitting: Cardiovascular Disease

## 2015-08-29 ENCOUNTER — Encounter: Payer: Medicare Other | Admitting: Sports Medicine

## 2015-08-30 DIAGNOSIS — R197 Diarrhea, unspecified: Secondary | ICD-10-CM | POA: Diagnosis not present

## 2015-09-01 ENCOUNTER — Encounter

## 2015-09-02 MED ORDER — LANSOPRAZOLE 30 MG CAP, DELAYED RELEASE
30 mg | ORAL_CAPSULE | ORAL | 3 refills | Status: DC
Start: 2015-09-02 — End: 2016-09-03

## 2015-09-02 MED ORDER — VITAMIN D2 1,250 MCG (50,000 UNIT) CAPSULE
1250 mcg (50,000 unit) | ORAL_CAPSULE | ORAL | 3 refills | Status: DC
Start: 2015-09-02 — End: 2016-11-02

## 2015-09-05 ENCOUNTER — Ambulatory Visit (INDEPENDENT_AMBULATORY_CARE_PROVIDER_SITE_OTHER): Payer: Medicare Other | Admitting: Sports Medicine

## 2015-09-05 ENCOUNTER — Telehealth: Payer: Self-pay

## 2015-09-05 ENCOUNTER — Encounter: Payer: Self-pay | Admitting: Sports Medicine

## 2015-09-05 DIAGNOSIS — M797 Fibromyalgia: Secondary | ICD-10-CM

## 2015-09-05 MED ORDER — PREGABALIN 75 MG PO CAPS: 75.0000 mg | ORAL_CAPSULE | Freq: Two times a day (BID) | ORAL | Status: DC

## 2015-09-05 MED ORDER — GABAPENTIN 300 MG PO CAPS: ORAL_CAPSULE | ORAL | Status: DC

## 2015-09-05 NOTE — Telephone Encounter (Signed)
The Lyrica cost a couple of hundred of dollars. She cannot use the coupon card because she has Medicare. She would like to try gabapentin. [facility].

## 2015-09-05 NOTE — Assessment & Plan Note (Signed)
Custom orthotics as above, restarting Lyrica. We will start with 75 mg twice a day and titrate up as needed.

## 2015-09-05 NOTE — Progress Notes (Signed)

## 2015-09-05 NOTE — Telephone Encounter (Signed)
Discontinue Lyrica, switching to gabapentin.

## 2015-09-06 NOTE — Telephone Encounter (Signed)
Left message advising of recommendations.  

## 2015-09-09 ENCOUNTER — Encounter: Payer: Self-pay | Admitting: Physician Assistant

## 2015-09-09 ENCOUNTER — Ambulatory Visit (INDEPENDENT_AMBULATORY_CARE_PROVIDER_SITE_OTHER): Payer: Medicare Other

## 2015-09-09 ENCOUNTER — Telehealth: Payer: Self-pay | Admitting: Neurology

## 2015-09-09 ENCOUNTER — Ambulatory Visit (INDEPENDENT_AMBULATORY_CARE_PROVIDER_SITE_OTHER): Payer: Medicare Other | Admitting: Physician Assistant

## 2015-09-09 VITALS — BP 151/71 | HR 62 | Ht 66.0 in | Wt 166.0 lb

## 2015-09-09 DIAGNOSIS — M25512 Pain in left shoulder: Secondary | ICD-10-CM | POA: Diagnosis not present

## 2015-09-09 DIAGNOSIS — M25561 Pain in right knee: Secondary | ICD-10-CM

## 2015-09-09 DIAGNOSIS — M25461 Effusion, right knee: Secondary | ICD-10-CM

## 2015-09-09 DIAGNOSIS — M79641 Pain in right hand: Secondary | ICD-10-CM | POA: Diagnosis not present

## 2015-09-09 DIAGNOSIS — R269 Unspecified abnormalities of gait and mobility: Secondary | ICD-10-CM

## 2015-09-09 DIAGNOSIS — R5382 Chronic fatigue, unspecified: Secondary | ICD-10-CM

## 2015-09-09 DIAGNOSIS — E538 Deficiency of other specified B group vitamins: Secondary | ICD-10-CM | POA: Diagnosis not present

## 2015-09-09 DIAGNOSIS — M8580 Other specified disorders of bone density and structure, unspecified site: Secondary | ICD-10-CM | POA: Diagnosis not present

## 2015-09-09 DIAGNOSIS — M25511 Pain in right shoulder: Secondary | ICD-10-CM | POA: Diagnosis not present

## 2015-09-09 DIAGNOSIS — M7989 Other specified soft tissue disorders: Secondary | ICD-10-CM | POA: Diagnosis not present

## 2015-09-09 DIAGNOSIS — R0602 Shortness of breath: Secondary | ICD-10-CM

## 2015-09-09 DIAGNOSIS — R55 Syncope and collapse: Secondary | ICD-10-CM | POA: Diagnosis not present

## 2015-09-09 NOTE — Telephone Encounter (Signed)
This patient will need a workup for syncope soon. She has had several recent blackouts, we will get her worked in sometime this week.

## 2015-09-09 NOTE — Telephone Encounter (Signed)
LVM returning pt call. Please schedule pt at 12pm with Dr Jannifer Franklin to work her in. Only if Dr Jannifer Franklin has 1 other 12pm. Make sure there are not 2 appt already. Gave [facility] phone number and hours.
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
## 2015-09-09 NOTE — Patient Instructions (Signed)
NO DRIVING>   Contusion A contusion is a deep bruise. Contusions are the result of a blunt injury to tissues and muscle fibers under the skin. The injury causes bleeding under the skin. The skin overlying the contusion may turn blue, purple, or yellow. Minor injuries will give you a painless contusion, but more severe contusions may stay painful and swollen for a few weeks.  CAUSES  This condition is usually caused by a blow, trauma, or direct force to an area of the body. SYMPTOMS  Symptoms of this condition include:  Swelling of the injured area.  Pain and tenderness in the injured area.  Discoloration. The area may have redness and then turn blue, purple, or yellow. DIAGNOSIS  This condition is diagnosed based on a physical exam and medical history. An X-ray, CT scan, or MRI may be needed to determine if there are any associated injuries, such as broken bones (fractures). TREATMENT  Specific treatment for this condition depends on what area of the body was injured. In general, the best treatment for a contusion is resting, icing, applying pressure to (compression), and elevating the injured area. This is often called the RICE strategy. Over-the-counter anti-inflammatory medicines may also be recommended for pain control.  HOME CARE INSTRUCTIONS   Rest the injured area.  If directed, apply ice to the injured area:  Put ice in a plastic bag.  Place a towel between your skin and the bag.  Leave the ice on for 20 minutes, 2-3 times per day.  If directed, apply light compression to the injured area using an elastic bandage. Make sure the bandage is not wrapped too tightly. Remove and reapply the bandage as directed by your health care provider.  If possible, raise (elevate) the injured area above the level of your heart while you are sitting or lying down.  Take over-the-counter and prescription medicines only as told by your health care provider. SEEK MEDICAL CARE IF:  Your  symptoms do not improve after several days of treatment.  Your symptoms get worse.  You have difficulty moving the injured area. SEEK IMMEDIATE MEDICAL CARE IF:   You have severe pain.  You have numbness in a hand or foot.  Your hand or foot turns pale or cold.   This information is not intended to replace advice given to you by your health care provider. Make sure you discuss any questions you have with your health care provider.   Document Released: 07/22/2005 Document Revised: 07/03/2015 Document Reviewed: 02/27/2015 Elsevier Interactive Patient Education [employer].

## 2015-09-10 ENCOUNTER — Encounter: Payer: Self-pay | Admitting: Physician Assistant

## 2015-09-10 ENCOUNTER — Encounter: Payer: Self-pay | Admitting: Neurology

## 2015-09-10 ENCOUNTER — Ambulatory Visit (INDEPENDENT_AMBULATORY_CARE_PROVIDER_SITE_OTHER): Payer: Medicare Other | Admitting: Neurology

## 2015-09-10 ENCOUNTER — Ambulatory Visit (INDEPENDENT_AMBULATORY_CARE_PROVIDER_SITE_OTHER): Payer: Medicare Other | Admitting: Sports Medicine

## 2015-09-10 VITALS — BP 146/86 | HR 54 | Ht 66.0 in | Wt 166.0 lb

## 2015-09-10 DIAGNOSIS — M25561 Pain in right knee: Secondary | ICD-10-CM

## 2015-09-10 DIAGNOSIS — M65851 Other synovitis and tenosynovitis, right thigh: Secondary | ICD-10-CM | POA: Diagnosis not present

## 2015-09-10 DIAGNOSIS — M47812 Spondylosis without myelopathy or radiculopathy, cervical region: Secondary | ICD-10-CM | POA: Insufficient documentation

## 2015-09-10 DIAGNOSIS — Z96651 Presence of right artificial knee joint: Secondary | ICD-10-CM | POA: Diagnosis not present

## 2015-09-10 DIAGNOSIS — R413 Other amnesia: Secondary | ICD-10-CM | POA: Diagnosis not present

## 2015-09-10 DIAGNOSIS — M797 Fibromyalgia: Secondary | ICD-10-CM | POA: Diagnosis not present

## 2015-09-10 DIAGNOSIS — R269 Unspecified abnormalities of gait and mobility: Secondary | ICD-10-CM

## 2015-09-10 DIAGNOSIS — E538 Deficiency of other specified B group vitamins: Secondary | ICD-10-CM | POA: Insufficient documentation

## 2015-09-10 DIAGNOSIS — R5382 Chronic fatigue, unspecified: Secondary | ICD-10-CM | POA: Insufficient documentation

## 2015-09-10 DIAGNOSIS — R55 Syncope and collapse: Secondary | ICD-10-CM | POA: Insufficient documentation

## 2015-09-10 DIAGNOSIS — I471 Supraventricular tachycardia: Secondary | ICD-10-CM

## 2015-09-10 HISTORY — DX: Syncope and collapse: R55

## 2015-09-10 LAB — COMPLETE METABOLIC PANEL WITH GFR
ALT: 23 U/L (ref 6–29)
AST: 26 U/L (ref 10–35)
Albumin: 4.1 g/dL (ref 3.6–5.1)
Alkaline Phosphatase: 73 U/L (ref 33–130)
BUN: 8 mg/dL (ref 7–25)
CO2: 27 mmol/L (ref 20–31)
Calcium: 9.3 mg/dL (ref 8.6–10.4)
Chloride: 100 mmol/L (ref 98–110)
Creat: 0.69 mg/dL (ref 0.50–0.99)
GFR, Est African American: >89 mL/min (ref >=60)
GFR, Est Non African American: >89 mL/min (ref >=60)
Glucose, Bld: 93 mg/dL (ref 65–99)
Potassium: 4.1 mmol/L (ref 3.5–5.3)
Sodium: 137 mmol/L (ref 135–146)
Total Bilirubin: 0.3 mg/dL (ref 0.2–1.2)
Total Protein: 6.6 g/dL (ref 6.1–8.1)

## 2015-09-10 LAB — CBC WITH DIFFERENTIAL/PLATELET
Basophils Absolute: 0.1 10*3/uL (ref 0.0–0.1)
Basophils Relative: 1 % (ref 0–1)
Eosinophils Absolute: 0.2 10*3/uL (ref 0.0–0.7)
Eosinophils Relative: 3 % (ref 0–5)
HCT: 38.5 % (ref 36.0–46.0)
Hemoglobin: 12.7 g/dL (ref 12.0–15.0)
Lymphocytes Relative: 34 % (ref 12–46)
Lymphs Abs: 2.2 10*3/uL (ref 0.7–4.0)
MCH: 29.9 pg (ref 26.0–34.0)
MCHC: 33 g/dL (ref 30.0–36.0)
MCV: 90.6 fL (ref 78.0–100.0)
MPV: 10.6 fL (ref 8.6–12.4)
Monocytes Absolute: 0.8 10*3/uL (ref 0.1–1.0)
Monocytes Relative: 12 % (ref 3–12)
Neutro Abs: 3.3 10*3/uL (ref 1.7–7.7)
Neutrophils Relative %: 50 % (ref 43–77)
Platelets: 304 10*3/uL (ref 150–400)
RBC: 4.25 MIL/uL (ref 3.87–5.11)
RDW: 13.7 % (ref 11.5–15.5)
WBC: 6.6 10*3/uL (ref 4.0–10.5)

## 2015-09-10 LAB — FERRITIN: Ferritin: 46 ng/mL (ref 10–291)

## 2015-09-10 LAB — VITAMIN D 25 HYDROXY (VIT D DEFICIENCY, FRACTURES): Vit D, 25-Hydroxy: 76 ng/mL (ref 30–100)

## 2015-09-10 LAB — TSH: TSH: 0.865 u[IU]/mL (ref 0.350–4.500)

## 2015-09-10 LAB — MAGNESIUM: Magnesium: 1.8 mg/dL (ref 1.5–2.5)

## 2015-09-10 LAB — VITAMIN B12: Vitamin B-12: 198 pg/mL — ABNORMAL LOW (ref 211–911)

## 2015-09-10 NOTE — Telephone Encounter (Signed)
Pt returned call. She has been scheduled for today at 12p. Pt was told to be here at 1145am
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
## 2015-09-10 NOTE — Assessment & Plan Note (Signed)
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 Return to see me in one month, formal PT.

## 2015-09-10 NOTE — Patient Instructions (Addendum)
We will get MRI of the brain and an EEG study. I will set you up for a cardiac monitor.   Syncope Syncope is a medical term for fainting or passing out. This means you lose consciousness and drop to the ground. People are generally unconscious for less than 5 minutes. You may have some muscle twitches for up to 15 seconds before waking up and returning to normal. Syncope occurs more often in older adults, but it can happen to anyone. While most causes of syncope are not dangerous, syncope can be a sign of a serious medical problem. It is important to seek medical care.  CAUSES  Syncope is caused by a sudden drop in blood flow to the brain. The specific cause is often not determined. Factors that can bring on syncope include:  Taking medicines that lower blood pressure.  Sudden changes in posture, such as standing up quickly.  Taking more medicine than prescribed.  Standing in one place for too long.  Seizure disorders.  Dehydration and excessive exposure to heat.  Low blood sugar (hypoglycemia).  Straining to have a bowel movement.  Heart disease, irregular heartbeat, or other circulatory problems.  Fear, emotional distress, seeing blood, or severe pain. SYMPTOMS  Right before fainting, you may:  Feel dizzy or light-headed.  Feel nauseous.  See all white or all black in your field of vision.  Have cold, clammy skin. DIAGNOSIS  Your health care provider will ask about your symptoms, perform a physical exam, and perform an electrocardiogram (ECG) to record the electrical activity of your heart. Your health care provider may also perform other heart or blood tests to determine the cause of your syncope which may include:  Transthoracic echocardiogram (TTE). During echocardiography, sound waves are used to evaluate how blood flows through your heart.  Transesophageal echocardiogram (TEE).  Cardiac monitoring. This allows your health care provider to monitor your heart rate  and rhythm in real time.  Holter monitor. This is a portable device that records your heartbeat and can help diagnose heart arrhythmias. It allows your health care provider to track your heart activity for several days, if needed.  Stress tests by exercise or by giving medicine that makes the heart beat faster. TREATMENT  In most cases, no treatment is needed. Depending on the cause of your syncope, your health care provider may recommend changing or stopping some of your medicines. HOME CARE INSTRUCTIONS  Have someone stay with you until you feel stable.  Do not drive, use machinery, or play sports until your health care provider says it is okay.  Keep all follow-up appointments as directed by your health care provider.  Lie down right away if you start feeling like you might faint. Breathe deeply and steadily. Wait until all the symptoms have passed.  Drink enough fluids to keep your urine clear or pale yellow.  If you are taking blood pressure or heart medicine, get up slowly and take several minutes to sit and then stand. This can reduce dizziness. SEEK IMMEDIATE MEDICAL CARE IF:   You have a severe headache.  You have unusual pain in the chest, abdomen, or back.  You are bleeding from your mouth or rectum, or you have black or tarry stool.  You have an irregular or very fast heartbeat.  You have pain with breathing.  You have repeated fainting or seizure-like jerking during an episode.  You faint when sitting or lying down.  You have confusion.  You have trouble walking.  You have severe weakness.  You have vision problems. If you fainted, call your local emergency services (911 in [place] Do not drive yourself to the hospital.    This information is not intended to replace advice given to you by your health care provider. Make sure you discuss any questions you have with your health care provider.   Document Released: 10/12/2005 Document Revised: 02/26/2015  Document Reviewed: 12/11/2011 Elsevier Interactive Patient Education [employer].

## 2015-09-12 ENCOUNTER — Telehealth: Payer: Self-pay | Admitting: Neurology

## 2015-09-12 NOTE — Telephone Encounter (Signed)
Pt called and was confused about her heart monitor that she is to wear. She is under the impression that she is to come to this office to have the monitor put on. Please call and advise [phone]

## 2015-09-12 NOTE — Telephone Encounter (Signed)
I called the patient. Unable to reach patient. I will try again.

## 2015-09-12 NOTE — Telephone Encounter (Signed)
The patient called back. I explained that the order for the heart monitor goes to a cardiology office. I explained that it is [facility] located on [address]. I gave her their phone number [phone]) to call if she has not heard from them by the beginning of next week.

## 2015-09-17 ENCOUNTER — Ambulatory Visit (INDEPENDENT_AMBULATORY_CARE_PROVIDER_SITE_OTHER): Payer: Medicare Other | Admitting: Family Medicine

## 2015-09-17 VITALS — BP 119/70 | HR 74 | Temp 97.4°F | Resp 16

## 2015-09-17 DIAGNOSIS — D510 Vitamin B12 deficiency anemia due to intrinsic factor deficiency: Secondary | ICD-10-CM | POA: Diagnosis not present

## 2015-09-17 MED ORDER — CYANOCOBALAMIN 1000 MCG/ML IJ SOLN
1000.0000 ug | INTRAMUSCULAR | Status: DC
  Administered 2015-09-17: 1000 ug via INTRAMUSCULAR

## 2015-09-17 NOTE — Progress Notes (Signed)
Agree iwht below.   [name], MD

## 2015-09-17 NOTE — Progress Notes (Signed)
   Subjective:    Patient ID: Mia [name], female    DOB: [birth year], 64 y.o.   MRN: [id number]  HPIPatient is here for first Vit B12 injection. Reports extreme fatigue/lack of energy. Tomorrow has appt to get heart monitor to wear for one month. In good spirits.    Review of Systems     Objective:   Physical Exam        Assessment & Plan:  Patient tolerated injection well without complications. Advised her make appointment for next injection in 30 days.

## 2015-09-18 ENCOUNTER — Ambulatory Visit (INDEPENDENT_AMBULATORY_CARE_PROVIDER_SITE_OTHER): Payer: Medicare Other

## 2015-09-18 DIAGNOSIS — M797 Fibromyalgia: Secondary | ICD-10-CM | POA: Diagnosis not present

## 2015-09-18 DIAGNOSIS — R269 Unspecified abnormalities of gait and mobility: Secondary | ICD-10-CM

## 2015-09-18 DIAGNOSIS — R55 Syncope and collapse: Secondary | ICD-10-CM

## 2015-09-18 DIAGNOSIS — R413 Other amnesia: Secondary | ICD-10-CM | POA: Diagnosis not present

## 2015-09-23 ENCOUNTER — Ambulatory Visit
Admission: RE | Admit: 2015-09-23 | Discharge: 2015-09-23 | Disposition: A | Discharge status: Home / Self Care | Payer: Medicare Other | Source: Ambulatory Visit | Attending: Neurology | Admitting: Neurology

## 2015-09-23 DIAGNOSIS — M797 Fibromyalgia: Secondary | ICD-10-CM

## 2015-09-23 DIAGNOSIS — R269 Unspecified abnormalities of gait and mobility: Secondary | ICD-10-CM

## 2015-09-23 DIAGNOSIS — R413 Other amnesia: Secondary | ICD-10-CM

## 2015-09-23 DIAGNOSIS — R55 Syncope and collapse: Secondary | ICD-10-CM

## 2015-09-24 ENCOUNTER — Telehealth: Payer: Self-pay | Admitting: Neurology

## 2015-09-24 NOTE — Telephone Encounter (Signed)
  I called the patient. MRI of the brain shows very minimal white matter changes, no etiology for balance issues or blackouts. The patient has a cardiac monitor on currently, she will have an EEG study. I will contact her with the results of these evaluations.   MRI brain results 09/23/15:  IMPRESSION: This MRI of the brain without contrast shows the following: 1. A few scattered T2/FLAIR hyperintense foci in the subcortical or deep white matter of the frontal lobes. This is a nonspecific finding most likely represents minimal age-related chronic microvascular ischemic changes. Migraine related changes or sequela of cardio-emboli or vasculitis are less likely. Demyelination would be unlikely to give this pattern. 2, There are no acute findings. 3. Brain volume appears normal for age.

## 2015-09-25 ENCOUNTER — Ambulatory Visit: Payer: Medicare Other | Admitting: Physical Therapy

## 2015-10-01 ENCOUNTER — Encounter: Payer: Self-pay | Admitting: Sports Medicine

## 2015-10-01 ENCOUNTER — Encounter: Payer: Self-pay | Admitting: Family Medicine

## 2015-10-01 ENCOUNTER — Ambulatory Visit (INDEPENDENT_AMBULATORY_CARE_PROVIDER_SITE_OTHER): Payer: Medicare Other | Admitting: Family Medicine

## 2015-10-01 ENCOUNTER — Other Ambulatory Visit: Payer: Self-pay

## 2015-10-01 ENCOUNTER — Ambulatory Visit (INDEPENDENT_AMBULATORY_CARE_PROVIDER_SITE_OTHER): Payer: Medicare Other | Admitting: Sports Medicine

## 2015-10-01 VITALS — BP 108/59 | HR 66 | Wt 169.0 lb

## 2015-10-01 VITALS — BP 108/59 | HR 66 | Ht 66.0 in | Wt 169.0 lb

## 2015-10-01 DIAGNOSIS — R197 Diarrhea, unspecified: Secondary | ICD-10-CM

## 2015-10-01 DIAGNOSIS — Z8719 Personal history of other diseases of the digestive system: Secondary | ICD-10-CM | POA: Diagnosis not present

## 2015-10-01 DIAGNOSIS — Z96651 Presence of right artificial knee joint: Secondary | ICD-10-CM

## 2015-10-01 DIAGNOSIS — R7301 Impaired fasting glucose: Secondary | ICD-10-CM | POA: Diagnosis not present

## 2015-10-01 DIAGNOSIS — IMO0001 Reserved for inherently not codable concepts without codable children: Secondary | ICD-10-CM

## 2015-10-01 DIAGNOSIS — H9193 Unspecified hearing loss, bilateral: Secondary | ICD-10-CM | POA: Diagnosis not present

## 2015-10-01 DIAGNOSIS — E538 Deficiency of other specified B group vitamins: Secondary | ICD-10-CM | POA: Diagnosis not present

## 2015-10-01 DIAGNOSIS — I10 Essential (primary) hypertension: Secondary | ICD-10-CM

## 2015-10-01 DIAGNOSIS — M797 Fibromyalgia: Secondary | ICD-10-CM

## 2015-10-01 LAB — POCT GLYCOSYLATED HEMOGLOBIN (HGB A1C): Hemoglobin A1C: 5.8

## 2015-10-01 MED ORDER — GABAPENTIN 300 MG PO CAPS: ORAL_CAPSULE | ORAL | Status: DC

## 2015-10-01 MED ORDER — CYANOCOBALAMIN 1000 MCG/ML IJ SOLN
1000.0000 ug | INTRAMUSCULAR | Status: DC
  Administered 2015-10-01: 1000 ug via INTRAMUSCULAR

## 2015-10-01 NOTE — Assessment & Plan Note (Signed)
Doing well on gabapentin 

## 2015-10-01 NOTE — Progress Notes (Signed)
   Subjective:    Patient ID: Mia [name], female    DOB: 04-21-1951, 64 y.o.   MRN: [id number]  HPI Hypertension, six-month follow-up- Pt denies chest pain, SOB, dizziness, or heart palpitations.  Taking meds as directed w/o problems.  Denies medication side effects.  Has episodes of feeling like it quivering.    IFG - no inc thirst or urination.   She is currently undergoing a workup with [name] for balance issues and blackouts. She is currently wearing a cardiac monitor and recently had a brain MRI showing only some minimal white matter changes.  Has EEG schedule for later that month.   Has had low energy and just started B12 shots for B12 def.  By 8PM goes to bed. Sleeps well all night.    Hx of c diff s/p fecal transplant. Put her on Deficid x 2.  Thinks her C diff may been back. Says her diarrhea has never stopped but it has gotten worse.  No blood in the stool. No fevers or chills.  She feels like she's had some bilateral hearing loss over the last couple years. She says her phone volume this turned all the way up and sometimes she still is adherent reading.  Review of Systems     Objective:   Physical Exam  Constitutional: She is oriented to person, place, and time. She appears well-developed and well-nourished.  HENT:  Head: Normocephalic and atraumatic.  Right Ear: External ear normal.  Left Ear: External ear normal.  Nose: Nose normal.  Mouth/Throat: Oropharynx is clear and moist.  Right TMs and canals are clear. Left canal blocked with cerumen.   Eyes: Conjunctivae and EOM are normal. Pupils are equal, round, and reactive to light.  Neck: Neck supple. No thyromegaly present.  Cardiovascular: Normal rate, regular rhythm and normal heart sounds.   Pulmonary/Chest: Effort normal and breath sounds normal. She has no wheezes.  Lymphadenopathy:    She has no cervical adenopathy.  Neurological: She is alert and oriented to person, place, and time.  Skin: Skin is warm and  dry.  Psychiatric: She has a normal mood and affect. Her behavior is normal.          Assessment & Plan:  HTN - well controlled. Continue current regimen.  F/U in 6 months.   IFG - Stable. F/U in 6 months.    Pernicious anemia - Has iron infusion scheduled for next week.   Diarrhea - will test for c diff with stool culture.   Hearing screen - will refer for more formal testing.

## 2015-10-01 NOTE — Progress Notes (Signed)
  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  Past medical history, Surgical history, Family history not pertinant except as noted below, Social history, Allergies, and medications have been entered into the medical record, reviewed, and no changes needed.   Review of Systems: No fevers, chills, night sweats, weight loss, chest pain, or shortness of breath.   Objective:    General: Well Developed, well nourished, and in no acute distress.  Neuro: Alert and oriented x3, extra-ocular muscles intact, sensation grossly intact.  HEENT: Normocephalic, atraumatic, pupils equal round reactive to light, neck supple, no masses, no lymphadenopathy, thyroid nonpalpable.  Skin: Warm and dry, no rashes. Cardiac: Regular rate and rhythm, no murmurs rubs or gallops, no lower extremity edema.  Respiratory: Clear to auscultation bilaterally. Not using accessory muscles, speaking in full sentences.  Impression and Recommendations:

## 2015-10-01 NOTE — Assessment & Plan Note (Addendum)
Aspiration and injection as above without signs of periprosthetic fracture on x-ray. We also did a percutaneous barbotage of quadriceps distal calcific tendinopathy. She is about 50% better today and has not yet started formal PT. Return to see me after at least a solid month of physical therapy.  Symptoms are predominantly burning and not improved with gabapentin. Her knee brace was too large last time, it is in good shape so she should be able to simply exchange it for a smaller size in a nurse visit

## 2015-10-03 ENCOUNTER — Other Ambulatory Visit: Payer: Self-pay | Admitting: Family Medicine

## 2015-10-03 ENCOUNTER — Encounter: Payer: Self-pay | Admitting: Rehabilitative and Restorative Service Providers"

## 2015-10-03 ENCOUNTER — Ambulatory Visit (INDEPENDENT_AMBULATORY_CARE_PROVIDER_SITE_OTHER): Payer: Medicare Other | Admitting: Rehabilitative and Restorative Service Providers"

## 2015-10-03 DIAGNOSIS — M25562 Pain in left knee: Secondary | ICD-10-CM

## 2015-10-03 DIAGNOSIS — Z7409 Other reduced mobility: Secondary | ICD-10-CM

## 2015-10-03 DIAGNOSIS — R197 Diarrhea, unspecified: Secondary | ICD-10-CM | POA: Diagnosis not present

## 2015-10-03 DIAGNOSIS — R29898 Other symptoms and signs involving the musculoskeletal system: Secondary | ICD-10-CM

## 2015-10-03 DIAGNOSIS — R269 Unspecified abnormalities of gait and mobility: Secondary | ICD-10-CM | POA: Diagnosis not present

## 2015-10-03 DIAGNOSIS — R531 Weakness: Secondary | ICD-10-CM

## 2015-10-03 DIAGNOSIS — R6889 Other general symptoms and signs: Secondary | ICD-10-CM

## 2015-10-03 DIAGNOSIS — Z8719 Personal history of other diseases of the digestive system: Secondary | ICD-10-CM | POA: Diagnosis not present

## 2015-10-03 NOTE — Patient Instructions (Signed)
Quad Set    With other leg bent, foot flat, slowly tighten muscles on thigh of straight leg while counting out loud to __10__. Repeat with other leg. Repeat __10-30_ times. Do __2-3__ sessions per day.   Bridging    Slowly raise buttocks from floor, keeping stomach tight. Hold 10 sec  Repeat __10__ times per set. Do __1-3_ sets per session. Do _1-2___ sessions per day.
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
## 2015-10-04 LAB — C. DIFFICILE GDH AND TOXIN A/B
C. difficile GDH: NOT DETECTED
C. difficile Toxin A/B: NOT DETECTED

## 2015-10-07 ENCOUNTER — Ambulatory Visit (INDEPENDENT_AMBULATORY_CARE_PROVIDER_SITE_OTHER): Payer: Medicare Other | Admitting: Rehabilitative and Restorative Service Providers"

## 2015-10-07 ENCOUNTER — Encounter: Payer: Self-pay | Admitting: Rehabilitative and Restorative Service Providers"

## 2015-10-07 DIAGNOSIS — R269 Unspecified abnormalities of gait and mobility: Secondary | ICD-10-CM | POA: Diagnosis not present

## 2015-10-07 DIAGNOSIS — R6889 Other general symptoms and signs: Secondary | ICD-10-CM

## 2015-10-07 DIAGNOSIS — R29898 Other symptoms and signs involving the musculoskeletal system: Secondary | ICD-10-CM

## 2015-10-07 DIAGNOSIS — M25562 Pain in left knee: Secondary | ICD-10-CM

## 2015-10-07 DIAGNOSIS — Z7409 Other reduced mobility: Secondary | ICD-10-CM

## 2015-10-07 DIAGNOSIS — R531 Weakness: Secondary | ICD-10-CM

## 2015-10-07 LAB — STOOL CULTURE

## 2015-10-07 NOTE — Therapy (Signed)
[place], [place], [address] Phone: [phone]   Fax:  [phone]  Physical Therapy Treatment  Patient Details  Name: Mia [name] MRN: [id number] Date of Birth: June 13, 1951 Referring Provider: Dr Darene Lamer  Encounter Date: 10/07/2015      PT End of Session - 10/07/15 0924    Visit Number 2   Number of Visits 12   Date for PT Re-Evaluation 11/14/15   PT Start Time 0924   PT Stop Time 1015   PT Time Calculation (min) 51 min   Activity Tolerance Patient tolerated treatment well      Past Medical History  Diagnosis Date  . SVT (supraventricular tachycardia) ([place])   . Sleep apnea     a. on cpap.  . Raynaud phenomenon   . Anemia   . Arthritis   . Blood transfusion   . Diabetes mellitus   . GERD (gastroesophageal reflux disease)     gastritis  . Hypertension   . Neuromuscular disorder ([place])     sclerosis  . Pseudogout   . Thyroid disease     hypothyroidism  . Hemorrhoids   . Rectal bleeding   . PONV (postoperative nausea and vomiting)   . Memory difficulty 12/14/2013  . Fibromyalgia   . Anal fissure   . Gall stones   . Interstitial cystitis   . Chronic Dyspnea     a. 01/2013 Echo: EF 60-65%, Gr 1 DD, PASP 46mmHg.  . Chest pain     a. 01/2013 MV: EF 59%, no ischemia.  . Peptic ulcer   . C. difficile colitis   . Osteoporosis   . Syncope 09/10/2015    Past Surgical History  Procedure Laterality Date  . Cholecystectomy    . Appendectomy    . Tonsillectomy    . Hysterectomy - unknown type    . Knee surgery      x6  . Oophorectomy    . Dilation and curettage of uterus    . Breast biopsy    . Shoulder surgery      bilateral- bones spur  . Neck surgery      fusion  . Quadriceps repair Right   . Bladder surgery      x2  . Cataract extraction Bilateral   . Rectocele repair      x2  . Enterocele repair      x2  . Cardiac catheterization    . Eye surgery      retina  .  Shoulder arthroscopy with subacromial decompression, rotator cuff repair and bicep tendon repair  10/06/2012    Procedure: SHOULDER ARTHROSCOPY WITH SUBACROMIAL DECOMPRESSION, ROTATOR CUFF REPAIR AND BICEP TENDON REPAIR;  Surgeon: Nita Sells, MD;  Location: [facility];  Service: Orthopedics;  Laterality: Right;  Arthroscopic  Repair  of  Subscapularis, Open Biceps Tenodesis  . Bladder surgery    . Total knee arthroplasty      There were no vitals filed for this visit.  Visit Diagnosis:  Left knee pain  Weakness of both lower extremities  Abnormal gait  Decreased strength, endurance, and mobility      Subjective Assessment - 10/07/15 0925    Subjective Less pai in her knees today. Expects pain because she has so much arthritis. She worked on her exercises about once a day.    Currently in Pain? No/denies  [facility] Adult PT Treatment/Exercise - 10/07/15 0001    Ambulation/Gait   Gait Comments gait with rolling walker 150 ft x 2; 60 ft x 2 with verbal cues and SBA - instruction in safe gait and proper gait pattern   Knee/Hip Exercises: Stretches   Passive Hamstring Stretch 3 reps;30 seconds   Knee/Hip Exercises: Aerobic   Nustep L3 x 2 min; L3 x3 min    Knee/Hip Exercises: Standing   Functional Squat 5 reps;3 seconds  very shallow knee bend    Knee/Hip Exercises: Seated   Sit to Sand 5 reps;with UE support  from elevated surface/hand hold +1 assist of PT   Knee/Hip Exercises: Supine   Quad Sets Strengthening;Both;1 set;10 reps  10 sec hold    Bridges Strengthening;Both;1 set;10 reps  10 sec hold    Other Supine Knee/Hip Exercises 3 part core 10 sec hold x 10                 PT Education - 10/07/15 0955    Education provided Yes   Education Details HEP   Person(s) Educated Patient   Methods Explanation;Demonstration;Tactile cues;Verbal cues   Comprehension Verbalized understanding;Returned  demonstration;Verbal cues required;Tactile cues required             PT Long Term Goals - 10/03/15 1714    PT LONG TERM GOAL #1   Title instruct patient in appropriate HEP to strenghten bilat hips/knees 11/14/15   Time 6   Period Weeks   Status New   PT LONG TERM GOAL #2   Title Increase strength bilat LE's 1/2 to 1 muscle grade 11/14/15    Time 6   Period Weeks   Status New   PT LONG TERM GOAL #3   Title Improve gait safety - with recommendation for assistive device as indicated 11/14/15   Time 6   Period Weeks   Status New   PT LONG TERM GOAL #4   Title I in appropriate HEP 11/14/15   Time 6   Period Weeks   Status New   PT LONG TERM GOAL #5   Title Improve FOTO to </= 46% limitation 11/14/15   Time 6   Period Weeks   Status New               Plan - 10/07/15 1019    Clinical Impression Statement Pronounced weakness bilat LE's. Unable to do SLR on either leg. Difficulty with transitional movement and standing. Gait and gait safety improved with rolling walker and pt was open to using rolling walker for distances or when she is tired. Discussed with patient's husband. Will email MD recommendation.    Pt will benefit from skilled therapeutic intervention in order to improve on the following deficits Abnormal gait;Decreased strength;Decreased mobility;Decreased endurance;Decreased activity tolerance;Pain   Rehab Potential Good   PT Frequency 2x / week   PT Duration 6 weeks   PT Treatment/Interventions Patient/family education;ADLs/Self Care Home Management;Therapeutic activities;Therapeutic exercise;Electrical Stimulation;Moist Heat;Dry needling;Manual techniques;Neuromuscular re-education;Balance training;Gait training;Stair training;Cryotherapy;Ultrasound   PT Next Visit Plan Cont desentization training for burning sensation quad tendon at superior patella; core strengthening; LE strnegthening    PT Home Exercise Plan HEP TENS for desentization and pain; forward note  to MD re walker    Consulted and Agree with Plan of Care Patient        Problem List Patient Active Problem List   Diagnosis Date Noted  . B12 deficiency 09/10/2015  . Syncope 09/10/2015  . Left shoulder pain  09/10/2015  . Chronic fatigue 09/10/2015  . Aortic atherosclerosis ([place]) 04/01/2015  . 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 Chronic Dyspnea   . Chest pain   . DOE (dyspnea on exertion)   . Benign head tremor 06/11/2014  . Low back pain 06/11/2014  . IFG (impaired fasting glucose) 03/09/2014  . Osteopenia 03/09/2014  . IC (interstitial cystitis) 03/09/2014  . Hemorrhoid 03/09/2014  . Retinal wrinkling, right eye 03/09/2014  . Fibromyalgia 03/09/2014  . GERD (gastroesophageal reflux disease) 03/09/2014  . History of arthroplasty of right knee 01/31/2014  . Memory difficulty 12/14/2013  . Abnormality of gait 12/14/2013  . Hemorrhoids, external, thrombosed 06/22/2011  . Hypertension 03/11/2011  . SVT (supraventricular tachycardia) ([place])   . Raynaud phenomenon   . EDEMA 08/25/2010  . Nonspecific (abnormal) findings on radiological and other examination of body structure 08/25/2010  . COMPUTERIZED TOMOGRAPHY, CHEST, ABNORMAL 08/25/2010  . OSA (obstructive sleep apnea) 04/24/2009  . 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 ALLERGIC RHINITIS 06/11/2008  . OSTEOARTHRITIS 06/11/2008  . Dyspnea 06/11/2008  . 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 Chronic diastolic CHF (congestive heart failure) ([place]) 06/08/2008  . INTERSTITIAL CYSTITIS 06/08/2008    Robert Sperl Nilda Simmer PT, MPH  10/07/2015, 10:22 AM  [facility] [place], [place], [address] Phone: [phone]   Fax:  [phone]  Name: Mia [name] MRN: [id number] Date of Birth: 07-Mar-1951

## 2015-10-07 NOTE — Patient Instructions (Signed)
  Lumbar core    With neutral spine, tighten pelvic floor and abdominals sucking belly button to back bone, tighten muscles in back at waist. Hold 10 sec Repeat _10__ times. Do _several __ times a day Progress to do this exercise sitting; standing; walking and with functional activities.  HIP: Hamstrings - Supine   Opposite knee bent Place strap around foot. Raise leg up, keep knee straight. Hold _30__ seconds. _3__ reps per set, _1-2times per day   Functional Quadriceps: Sit to Stand    Sit on edge of chair, feet flat on floor. Stand upright, extending knees fully. Lower slowly to sit back down.  Hands forward holding husband's hands Repeat __5-10__ times per set. Do __1-2__ sets per session. Do _2-3___ sessions per day. Have husband help with this for now    Back Wall Slide    With feet 8-10__ inches from wall, lean as much of back against the wall as possible. Gently squat down _6-8__ inches, keeping back against wall.  Hold _2-5___ seconds while counting out loud. Repeat _5___ times. Working toward 10 reps Do _2-3___ sessions per day.

## 2015-10-09 ENCOUNTER — Encounter: Payer: Medicare Other | Admitting: Physical Therapy

## 2015-10-10 ENCOUNTER — Ambulatory Visit (INDEPENDENT_AMBULATORY_CARE_PROVIDER_SITE_OTHER): Payer: Medicare Other | Admitting: Neurology

## 2015-10-10 ENCOUNTER — Telehealth: Payer: Self-pay | Admitting: Neurology

## 2015-10-10 DIAGNOSIS — M797 Fibromyalgia: Secondary | ICD-10-CM

## 2015-10-10 DIAGNOSIS — R55 Syncope and collapse: Secondary | ICD-10-CM | POA: Diagnosis not present

## 2015-10-10 DIAGNOSIS — R413 Other amnesia: Secondary | ICD-10-CM

## 2015-10-10 DIAGNOSIS — R269 Unspecified abnormalities of gait and mobility: Secondary | ICD-10-CM

## 2015-10-10 NOTE — Telephone Encounter (Signed)
EEG appears to be within normal limits. Cardiac monitor study results are pending.
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
## 2015-10-11 ENCOUNTER — Ambulatory Visit (INDEPENDENT_AMBULATORY_CARE_PROVIDER_SITE_OTHER): Payer: Medicare Other | Admitting: Physical Therapy

## 2015-10-11 ENCOUNTER — Inpatient Hospital Stay: Admit: 2015-10-11 | Payer: BLUE CROSS/BLUE SHIELD | Primary: Internal Medicine

## 2015-10-11 ENCOUNTER — Encounter: Primary: Internal Medicine

## 2015-10-11 ENCOUNTER — Encounter: Admit: 2015-10-11 | Discharge: 2015-10-11 | Payer: PRIVATE HEALTH INSURANCE | Primary: Internal Medicine

## 2015-10-11 DIAGNOSIS — Z7409 Other reduced mobility: Secondary | ICD-10-CM

## 2015-10-11 DIAGNOSIS — R29898 Other symptoms and signs involving the musculoskeletal system: Secondary | ICD-10-CM | POA: Diagnosis not present

## 2015-10-11 DIAGNOSIS — R6889 Other general symptoms and signs: Secondary | ICD-10-CM

## 2015-10-11 DIAGNOSIS — R531 Weakness: Secondary | ICD-10-CM

## 2015-10-11 DIAGNOSIS — R269 Unspecified abnormalities of gait and mobility: Secondary | ICD-10-CM | POA: Diagnosis not present

## 2015-10-11 DIAGNOSIS — M25562 Pain in left knee: Secondary | ICD-10-CM

## 2015-10-11 DIAGNOSIS — Z0000 Encounter for general adult medical examination without abnormal findings: Secondary | ICD-10-CM

## 2015-10-11 NOTE — Patient Instructions (Signed)
Abduction    Lift leg up toward ceiling. Return.  Repeat _5___ times each leg, 3 sets. http://[email or web address].exer.us/386   Abduction: Clam (Eccentric) - Side-Lying    Lie on side with knees bent. Lift top knee, keeping feet together. Keep trunk steady. Slowly lower for 3-5 seconds. _5__ reps per set, _2__ sets per dayhttp://ecce.exer.[email or web address]   [facility] [place] [address] [place], [place] [address]  [phone] (office) [phone] (fax)

## 2015-10-11 NOTE — Therapy (Signed)
[place], [place], [address] Phone: [phone]   Fax:  [phone]  Physical Therapy Treatment  Patient Details  Name: Mia [name] MRN: [id number] Date of Birth: January 04, 1951 Referring Provider: Dr. Pollyann Glen  Encounter Date: 10/11/2015      PT End of Session - 10/11/15 1425    Visit Number 3   Number of Visits 12   Date for PT Re-Evaluation 11/14/15   PT Start Time 1335   PT Stop Time 1425   PT Time Calculation (min) 50 min      Past Medical History  Diagnosis Date  . SVT (supraventricular tachycardia) ([place])   . Sleep apnea     a. on cpap.  . Raynaud phenomenon   . Anemia   . Arthritis   . Blood transfusion   . Diabetes mellitus   . GERD (gastroesophageal reflux disease)     gastritis  . Hypertension   . Neuromuscular disorder ([place])     sclerosis  . Pseudogout   . Thyroid disease     hypothyroidism  . Hemorrhoids   . Rectal bleeding   . PONV (postoperative nausea and vomiting)   . Memory difficulty 12/14/2013  . Fibromyalgia   . Anal fissure   . Gall stones   . Interstitial cystitis   . Chronic Dyspnea     a. 01/2013 Echo: EF 60-65%, Gr 1 DD, PASP 84mmHg.  . Chest pain     a. 01/2013 MV: EF 59%, no ischemia.  . Peptic ulcer   . C. difficile colitis   . Osteoporosis   . Syncope 09/10/2015    Past Surgical History  Procedure Laterality Date  . Cholecystectomy    . Appendectomy    . Tonsillectomy    . Hysterectomy - unknown type    . Knee surgery      x6  . Oophorectomy    . Dilation and curettage of uterus    . Breast biopsy    . Shoulder surgery      bilateral- bones spur  . Neck surgery      fusion  . Quadriceps repair Right   . Bladder surgery      x2  . Cataract extraction Bilateral   . Rectocele repair      x2  . Enterocele repair      x2  . Cardiac catheterization    . Eye surgery      retina  . Shoulder arthroscopy with subacromial  decompression, rotator cuff repair and bicep tendon repair  10/06/2012    Procedure: SHOULDER ARTHROSCOPY WITH SUBACROMIAL DECOMPRESSION, ROTATOR CUFF REPAIR AND BICEP TENDON REPAIR;  Surgeon: Nita Sells, MD;  Location: [facility];  Service: Orthopedics;  Laterality: Right;  Arthroscopic  Repair  of  Subscapularis, Open Biceps Tenodesis  . Bladder surgery    . Total knee arthroplasty      There were no vitals filed for this visit.  Visit Diagnosis:  Left knee pain  Weakness of both lower extremities  Abnormal gait  Decreased strength, endurance, and mobility      Subjective Assessment - 10/11/15 1338    Subjective Pt reports there hasn't been any changes since last visit.  Does have a new pain in Rt medial knee.     Currently in Pain? Yes   Pain Score 1    Pain Location Knee   Pain Orientation Right   Pain Descriptors / Indicators Tender   Aggravating  Factors  touch to area medial and superior to knee cap.     Pain Relieving Factors not touching knee.             [facility] PT Assessment - 10/11/15 0001    Assessment   Medical Diagnosis Rt knee pain    Referring Provider Dr. Pollyann Glen   Onset Date/Surgical Date 09/11/15   Hand Dominance Right   Next MD Visit 1/17   Prior Therapy yes after knee surgeries most recently after TKA             [facility] Adult PT Treatment/Exercise - 10/11/15 0001    Knee/Hip Exercises: Stretches   Passive Hamstring Stretch Right;Left;2 reps;30 seconds  seated, straight back   Quad Stretch Right;3 reps;30 seconds;Left   Knee/Hip Exercises: Aerobic   Nustep L4: 3 min, L2: 2 min    Knee/Hip Exercises: Standing   Functional Squat 2 sets;5 reps   Functional Squat Limitations mirror for feedbach, tactile cues to correct form.    Wall Squat 5 reps   Wall Squat Limitations VC to even weight between feet   Knee/Hip Exercises: Seated   Long Arc Quad AROM;Right;Left;5 reps   Knee/Hip Exercises: Supine   Bridges  Strengthening;Both;1 set;10 reps  10 sec hold    Bridges Limitations VC for increased hip extension   Knee/Hip Exercises: Sidelying   Hip ABduction 5 reps  LLE, trial   Clams 2 sets of 5 reps each side   Manual Therapy   Manual Therapy Soft tissue mobilization;Taping   Manual therapy comments Ktape applied to medial knee / proximal adductor    Soft tissue mobilization to suprapatellar tendon / Rt quad/ Rt distal quad                 PT Education - 10/11/15 1441    Education provided Yes   Education Details HEP   Person(s) Educated Patient   Methods Explanation;Handout   Comprehension Verbalized understanding             PT Long Term Goals - 10/03/15 1714    PT LONG TERM GOAL #1   Title instruct patient in appropriate HEP to strenghten bilat hips/knees 11/14/15   Time 6   Period Weeks   Status New   PT LONG TERM GOAL #2   Title Increase strength bilat LE's 1/2 to 1 muscle grade 11/14/15    Time 6   Period Weeks   Status New   PT LONG TERM GOAL #3   Title Improve gait safety - with recommendation for assistive device as indicated 11/14/15   Time 6   Period Weeks   Status New   PT LONG TERM GOAL #4   Title I in appropriate HEP 11/14/15   Time 6   Period Weeks   Status New   PT LONG TERM GOAL #5   Title Improve FOTO to </= 46% limitation 11/14/15   Time 6   Period Weeks   Status New               Plan - 10/11/15 1435    Clinical Impression Statement Pt continues with bilateral LE weakness; difficulty with LAQ and mini squats.  Pt tolerated new exercises well, with short rest breaks after 5 rep sets.  Progressing towards goals.    Pt will benefit from skilled therapeutic intervention in order to improve on the following deficits Abnormal gait;Decreased strength;Decreased mobility;Decreased endurance;Decreased activity tolerance;Pain   Rehab Potential Good   PT Frequency 2x /  week   PT Duration 6 weeks   PT Treatment/Interventions Patient/family  education;ADLs/Self Care Home Management;Therapeutic activities;Therapeutic exercise;Electrical Stimulation;Moist Heat;Dry needling;Manual techniques;Neuromuscular re-education;Balance training;Gait training;Stair training;Cryotherapy;Ultrasound   PT Next Visit Plan Assess response to ktape on Rt knee; retape if helpful.  Continue strengthening for core/ LE.     Consulted and Agree with Plan of Care Patient        Problem List Patient Active Problem List   Diagnosis Date Noted  . B12 deficiency 09/10/2015  . Syncope 09/10/2015  . Left shoulder pain 09/10/2015  . Chronic fatigue 09/10/2015  . Aortic atherosclerosis ([place]) 04/01/2015  . 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 Chronic Dyspnea   . Chest pain   . DOE (dyspnea on exertion)   . Benign head tremor 06/11/2014  . Low back pain 06/11/2014  . IFG (impaired fasting glucose) 03/09/2014  . Osteopenia 03/09/2014  . IC (interstitial cystitis) 03/09/2014  . Hemorrhoid 03/09/2014  . Retinal wrinkling, right eye 03/09/2014  . Fibromyalgia 03/09/2014  . GERD (gastroesophageal reflux disease) 03/09/2014  . History of arthroplasty of right knee 01/31/2014  . Memory difficulty 12/14/2013  . Abnormality of gait 12/14/2013  . Hemorrhoids, external, thrombosed 06/22/2011  . Hypertension 03/11/2011  . SVT (supraventricular tachycardia) ([place])   . Raynaud phenomenon   . EDEMA 08/25/2010  . Nonspecific (abnormal) findings on radiological and other examination of body structure 08/25/2010  . COMPUTERIZED TOMOGRAPHY, CHEST, ABNORMAL 08/25/2010  . OSA (obstructive sleep apnea) 04/24/2009  . 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 ALLERGIC RHINITIS 06/11/2008  . OSTEOARTHRITIS 06/11/2008  . Dyspnea 06/11/2008  . 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 Chronic diastolic CHF (congestive heart failure) ([place]) 06/08/2008  . INTERSTITIAL CYSTITIS 06/08/2008    Kerin Perna, PTA 10/11/2015 2:41  PM  [place], [place], [address] Phone: [phone]   Fax:  [phone]  Name: Mia [name] MRN: [id number] Date of Birth: [birth year]

## 2015-10-12 LAB — METABOLIC PANEL, COMPREHENSIVE
A-G Ratio: 1.2 (ref 0.8–1.7)
ALT (SGPT): 30 U/L (ref 13–56)
AST (SGOT): 18 U/L (ref 15–37)
Albumin: 3.8 g/dL (ref 3.4–5.0)
Alk. phosphatase: 88 U/L (ref 45–117)
Anion gap: 7 mmol/L (ref 3.0–18)
BUN/Creatinine ratio: 16 (ref 12–20)
BUN: 11 MG/DL (ref 7.0–18)
Bilirubin, total: 0.3 MG/DL (ref 0.2–1.0)
CO2: 30 mmol/L (ref 21–32)
Calcium: 8.8 MG/DL (ref 8.5–10.1)
Chloride: 105 mmol/L (ref 100–108)
Creatinine: 0.68 MG/DL (ref 0.6–1.3)
GFR est AA: >60 mL/min/{1.73_m2} (ref >60)
GFR est non-AA: >60 mL/min/{1.73_m2} (ref >60)
Globulin: 3.1 g/dL (ref 2.0–4.0)
Glucose: 110 mg/dL — ABNORMAL HIGH (ref 74–99)
Potassium: 4 mmol/L (ref 3.5–5.5)
Protein, total: 6.9 g/dL (ref 6.4–8.2)
Sodium: 142 mmol/L (ref 136–145)

## 2015-10-12 LAB — LIPID PANEL
CHOL/HDL Ratio: 2.9 (ref 0–5.0)
Cholesterol, total: 135 MG/DL (ref <200)
HDL Cholesterol: 47 MG/DL (ref 40–60)
LDL, calculated: 58.6 MG/DL (ref 0–100)
Triglyceride: 147 MG/DL (ref <150)
VLDL, calculated: 29.4 MG/DL

## 2015-10-12 LAB — CBC W/O DIFF
HCT: 41.6 % (ref 35.0–45.0)
HGB: 12.7 g/dL (ref 12.0–16.0)
MCH: 29.5 PG (ref 24.0–34.0)
MCHC: 30.5 g/dL — ABNORMAL LOW (ref 31.0–37.0)
MCV: 96.5 FL (ref 74.0–97.0)
MPV: 9.9 FL (ref 9.2–11.8)
PLATELET: 197 10*3/uL (ref 135–420)
RBC: 4.31 M/uL (ref 4.20–5.30)
RDW: 13.4 % (ref 11.6–14.5)
WBC: 6 10*3/uL (ref 4.6–13.2)

## 2015-10-12 LAB — HEMOGLOBIN A1C W/O EAG: Hemoglobin A1c: 6.1 % — ABNORMAL HIGH (ref 4.2–5.6)

## 2015-10-14 ENCOUNTER — Encounter: Payer: Medicare Other | Admitting: Physical Therapy

## 2015-10-15 ENCOUNTER — Ambulatory Visit: Payer: Medicare Other

## 2015-10-16 ENCOUNTER — Ambulatory Visit (INDEPENDENT_AMBULATORY_CARE_PROVIDER_SITE_OTHER): Payer: Medicare Other | Admitting: Physical Therapy

## 2015-10-16 ENCOUNTER — Telehealth: Payer: Self-pay

## 2015-10-16 ENCOUNTER — Ambulatory Visit (INDEPENDENT_AMBULATORY_CARE_PROVIDER_SITE_OTHER): Payer: Medicare Other | Admitting: Family Medicine

## 2015-10-16 VITALS — BP 124/76 | HR 65 | Temp 97.8°F | Ht 66.0 in | Wt 169.0 lb

## 2015-10-16 DIAGNOSIS — Z7409 Other reduced mobility: Secondary | ICD-10-CM

## 2015-10-16 DIAGNOSIS — R531 Weakness: Secondary | ICD-10-CM

## 2015-10-16 DIAGNOSIS — D510 Vitamin B12 deficiency anemia due to intrinsic factor deficiency: Secondary | ICD-10-CM | POA: Diagnosis not present

## 2015-10-16 DIAGNOSIS — M25562 Pain in left knee: Secondary | ICD-10-CM

## 2015-10-16 DIAGNOSIS — R269 Unspecified abnormalities of gait and mobility: Secondary | ICD-10-CM

## 2015-10-16 DIAGNOSIS — R29898 Other symptoms and signs involving the musculoskeletal system: Secondary | ICD-10-CM

## 2015-10-16 DIAGNOSIS — R6889 Other general symptoms and signs: Secondary | ICD-10-CM

## 2015-10-16 MED ORDER — CYANOCOBALAMIN 1000 MCG/ML IJ SOLN
1000.0000 ug | Freq: Once | INTRAMUSCULAR | Status: AC
  Administered 2015-10-16: 1000 ug via INTRAMUSCULAR

## 2015-10-16 NOTE — Progress Notes (Signed)
   Subjective:    Patient ID: Mia [name], female    DOB: [birth year], 64 y.o.   MRN: [id number]  HPI  Patient is here for vitamin B 12 injection.  Denies gastointestianl problems or dizziness.  Review of Systems     Objective:   Physical Exam        Assessment & Plan:   Patient tolerated injection well without complications.  Patient advised to schedule follow up injection as directed.  [name], MD

## 2015-10-16 NOTE — Therapy (Addendum)
[facility] [place], [place], [address] Phone: [phone]   Fax:  [phone]  Physical Therapy Treatment  Patient Details  Name: Mia [name] MRN: [id number] Date of Birth: [birth year] Referring Provider: Dr. Pollyann Glen  Encounter Date: 10/16/2015      PT End of Session - 10/16/15 0944    Visit Number 4   Number of Visits 12   Date for PT Re-Evaluation 11/14/15   PT Start Time 0934   PT Stop Time 1018   PT Time Calculation (min) 44 min   Activity Tolerance Patient tolerated treatment well;Patient limited by fatigue      Past Medical History  Diagnosis Date  . SVT (supraventricular tachycardia) ([place])   . Sleep apnea     a. on cpap.  . Raynaud phenomenon   . Anemia   . Arthritis   . Blood transfusion   . Diabetes mellitus   . GERD (gastroesophageal reflux disease)     gastritis  . Hypertension   . Neuromuscular disorder ([place])     sclerosis  . Pseudogout   . Thyroid disease     hypothyroidism  . Hemorrhoids   . Rectal bleeding   . PONV (postoperative nausea and vomiting)   . Memory difficulty 12/14/2013  . Fibromyalgia   . Anal fissure   . Gall stones   . Interstitial cystitis   . Chronic Dyspnea     a. 01/2013 Echo: EF 60-65%, Gr 1 DD, PASP 2mHg.  . Chest pain     a. 01/2013 MV: EF 59%, no ischemia.  . Peptic ulcer   . C. difficile colitis   . Osteoporosis   . Syncope 09/10/2015    Past Surgical History  Procedure Laterality Date  . Cholecystectomy    . Appendectomy    . Tonsillectomy    . Hysterectomy - unknown type    . Knee surgery      x6  . Oophorectomy    . Dilation and curettage of uterus    . Breast biopsy    . Shoulder surgery      bilateral- bones spur  . Neck surgery      fusion  . Quadriceps repair Right   . Bladder surgery      x2  . Cataract extraction Bilateral   . Rectocele repair      x2  . Enterocele repair      x2  . Cardiac catheterization     . Eye surgery      retina  . Shoulder arthroscopy with subacromial decompression, rotator cuff repair and bicep tendon repair  10/06/2012    Procedure: SHOULDER ARTHROSCOPY WITH SUBACROMIAL DECOMPRESSION, ROTATOR CUFF REPAIR AND BICEP TENDON REPAIR;  Surgeon: JNita Sells MD;  Location: [facility]  Service: Orthopedics;  Laterality: Right;  Arthroscopic  Repair  of  Subscapularis, Open Biceps Tenodesis  . Bladder surgery    . Total knee arthroplasty      There were no vitals filed for this visit.  Visit Diagnosis:  Left knee pain  Weakness of both lower extremities  Abnormal gait  Decreased strength, endurance, and mobility      Subjective Assessment - 10/16/15 0945    Subjective Pt reports she has less knee pain, feels the tape may have helped. Pt inquired about d/c to HEP soon as she is unsure how much gain in strength in LE she will have.     Currently in Pain? No/denies  [facility] PT Assessment - 10/16/15 0001    Assessment   Medical Diagnosis Rt knee pain    Onset Date/Surgical Date 09/11/15   Hand Dominance Right   Next MD Visit 1/17   Strength   Right Hip Flexion 3-/5   Right Hip Extension 3+/5   Right Hip ABduction 3+/5   Right Hip ADduction 3/5   Left Hip Flexion 3+/5   Left Hip ABduction 4+/5   Left Hip ADduction 4+/5   Right Knee Flexion 4+/5   Right Knee Extension 3+/5   Left Knee Flexion 4+/5   Left Knee Extension 4+/5   Ambulation/Gait   Ambulation Distance (Feet) --  Around gym    Assistive device Rolling walker   Pre-Gait Activities Sit to/from stand with RW to ensure safe hand placement; repeated 10 times throughout session to increase motor memory.           [facility] PT Treatment/Exercise - 10/16/15 0001    Knee/Hip Exercises: Stretches   Passive Hamstring Stretch Right;Left;2 reps;30 seconds  seated, straight back   Knee/Hip Exercises: Aerobic   Stationary Bike L1: 1.5 min, rest, then 3.2 min  stopped due to fatigue   Knee/Hip Exercises: Standing   Forward Step Up 2 sets;10 reps;Right;Left;Hand Hold: 2  3" step   Step Down Right;Left;2 sets;10 reps;Hand Hold: 2  3" step   Other Standing Knee Exercises Narrow stance without UE support x 4 trials - LOB x 4 with min A to correct.  Tandem stance without UE support (very tremulous) -min A and stepping motion to correct    Manual Therapy   Manual therapy comments Ktape applied to medial knee / proximal adductor and suprapatellar tendon area to decrease pain and decompress tissue.                      PT Long Term Goals - 10/16/15 1030    PT LONG TERM GOAL #1   Title instruct patient in appropriate HEP to strenghten bilat hips/knees 11/14/15   Time 6   Period Weeks   Status On-going   PT LONG TERM GOAL #2   Title Increase strength bilat LE's 1/2 to 1 muscle grade 11/14/15    Time 6   Period Weeks   Status On-going   PT LONG TERM GOAL #3   Title Improve gait safety - with recommendation for assistive device as indicated 11/14/15   Time 6   Period Weeks   Status On-going   PT LONG TERM GOAL #4   Title I in appropriate HEP 11/14/15   Time 6   Period Weeks   Status On-going   PT LONG TERM GOAL #5   Title Improve FOTO to </= 46% limitation 11/14/15   Time 6   Period Weeks   Status On-going               Plan - 10/16/15 1032    Clinical Impression Statement Pt had positive response to Kinesiotape to Rt knee; is interested in learning how to apply on own.  Pt demonstrated slight improvement of LE strength since last assessment. Pt initially challenged with reciprocal gait pattern with 3" stairs, but with repetition pt able to perform with greater ease.  Pt continues with decreased balance, especially with narrow stance - had multiple LOB requiring min A to correct.  Gradual progress towards goals.    Pt will benefit from skilled therapeutic intervention in order to improve on the following deficits Abnormal  gait;Decreased  strength;Decreased mobility;Decreased endurance;Decreased activity tolerance;Pain   Rehab Potential Good   PT Frequency 2x / week   PT Duration 6 weeks   PT Treatment/Interventions Patient/family education;ADLs/Self Care Home Management;Therapeutic activities;Therapeutic exercise;Electrical Stimulation;Moist Heat;Dry needling;Manual techniques;Neuromuscular re-education;Balance training;Gait training;Stair training;Cryotherapy;Ultrasound   PT Next Visit Plan Per pt's request, will finalize HEP, educate pt on self tape techniques, and revisit gait/safety with RW and d/c.  Complete FOTO.    Consulted and Agree with Plan of Care Patient        Problem List Patient Active Problem List   Diagnosis Date Noted  . B12 deficiency 09/10/2015  . Syncope 09/10/2015  . Left shoulder pain 09/10/2015  . Chronic fatigue 09/10/2015  . Aortic atherosclerosis ([place]) 04/01/2015  . 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 Chronic Dyspnea   . Chest pain   . DOE (dyspnea on exertion)   . Benign head tremor 06/11/2014  . Low back pain 06/11/2014  . IFG (impaired fasting glucose) 03/09/2014  . Osteopenia 03/09/2014  . IC (interstitial cystitis) 03/09/2014  . Hemorrhoid 03/09/2014  . Retinal wrinkling, right eye 03/09/2014  . Fibromyalgia 03/09/2014  . GERD (gastroesophageal reflux disease) 03/09/2014  . History of arthroplasty of right knee 01/31/2014  . Memory difficulty 12/14/2013  . Abnormality of gait 12/14/2013  . Hemorrhoids, external, thrombosed 06/22/2011  . Hypertension 03/11/2011  . SVT (supraventricular tachycardia) ([place])   . Raynaud phenomenon   . EDEMA 08/25/2010  . Nonspecific (abnormal) findings on radiological and other examination of body structure 08/25/2010  . COMPUTERIZED TOMOGRAPHY, CHEST, ABNORMAL 08/25/2010  . OSA (obstructive sleep apnea) 04/24/2009  . 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 ALLERGIC RHINITIS 06/11/2008  .  OSTEOARTHRITIS 06/11/2008  . Dyspnea 06/11/2008  . 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 Chronic diastolic CHF (congestive heart failure) ([place]) 06/08/2008  . INTERSTITIAL CYSTITIS 06/08/2008    Kerin Perna, PTA 10/16/2015 10:46 AM  [facility] [address] [place], [place], [address] Phone: [phone]   Fax:  [phone]  Name: Mia [name] MRN: [id number] Date of Birth: Jan 22, 1951    PHYSICAL THERAPY DISCHARGE SUMMARY  Visits from Start of Care: 4  Current functional level related to goals / functional outcomes: Instructed in safe gait with assistive device and recommend use as needed. Instructed in HEP    Remaining deficits: Continued LE weakness and decreased safety; fall risk.   Education / Equipment: HEP  Plan: Patient agrees to discharge.  Patient goals were partially met. Patient is being discharged due to the patient's request.  ?????    Celyn P. Helene Kelp PT, MPH 11/14/2015 12:56 PM

## 2015-10-16 NOTE — Telephone Encounter (Signed)
Letter in box. 

## 2015-10-16 NOTE — Patient Instructions (Signed)
Walker Use HOW TO TELL IF A WALKER IS THE RIGHT SIZE  With your arms hanging at your sides, the walker handles should be at wrist level. If you cannot find the exact fit, choose the height that is most comfortable.  If you have been instructed to not place weight on one of your legs, you may feel more comfortable with a shorter height. If you are using the walker for balance, you may prefer a taller height.  Adjust the height by using the push buttons on the legs of your walker.  In rest position, the back leg of the walkers should be no further ahead than your toes. With your hands resting on the grips, your elbows should be slightly bent at about a 30 degree angle.  Ask your physical therapist or caregiver if you have any concerns. HOW TO USE A STANDARD WALKER (NO WHEELS)  Pick your walker up (do not slide your walker) and place it one step length in front of you. The back legs of the walker should be no further ahead than your toes. You should not feel like you need to lean forward to keep your hands on the grips. As you set the walker down, make sure all 4 leg tips contact the ground at the same time.  Hold onto the walker for support and step forward with your weaker leg into the middle of the walker. Follow the weight bearing instructions your caregiver has given you.  Push down with your hands and step forward with your stronger leg.  Be careful not to let the walker get too far ahead of you as you walk.  Repeat the process for each step. HOW TO USE A FRONT-WHEELED WALKER  Slide your walker forward. The back legs of the walker should be no further ahead than your toes. You should not feel like you need to lean forward to keep your hands on the grips.  Hold onto the walker for support and step forward with your weaker leg into the middle of the walker. Follow the weight bearing instructions your caregiver has given you.  Push down with your hands and step forward with your stronger  leg.  Be careful not to let the walker get too far ahead of you as you walk.  Repeat the process for each step.  If your walker does not glide well over carpet, consider cutting an "x" in 2 old tennis balls and placing them over the back legs of your walker. STANDING UP FROM A CHAIR WITH ARMRESTS  It is best to sit in a firm chair with armrests.  Position your walker directly in front of your chair. Do not pull on the walker when standing up. It is too unstable to support weight when pulled on. ONE HAND ON WALKER WHEN COMING UP?SITTING DOWN.   Slide forward in the chair, with your weaker leg ahead and stronger leg bent near the chair.  Lean forward and push up from your chair with both hands on the armrests. Straighten your stronger leg, rising to standing. Do not pull yourself up from the walker. This may cause it to tip.  When you feel steady on your feet, carefully move one hand at a time to the walker.  Stand for a few seconds to stabilize your balance before you start to walk. STANDING UP FROM A CHAIR WITHOUT ARMRESTS  It is best to sit in a firm chair. A low seat or an overstuffed chair or sofa is hard  to get out of.  Place the walker in front of you. Do not pull on the walker when coming to a standing position.  Slide forward in the chair, with your weaker leg ahead and stronger leg bent near the chair.  Push down on the chair seat with the hand opposite your weaker leg. Keep your other hand on the center of the walker's crossbar.  Stand, steady your balance, and place your hands on the walker handgrips. SITTING DOWN  Always back up toward your chair, using your walker, until you feel the back of your legs touch the chair.  If the chair has armrests, carefully reach back to put your hands on the armrests, and slowly lower your weight.  If the chair does not have armrests, consider backing up to the side of the chair. You can then hold onto the back of the chair and the  front of the seat to slowly lower yourself.  You should never feel like you are falling into your chair. USING A WALKER ON STEPS   Before attempting to use your walker on steps, practice with your physical therapist.  If you are going up a step wide enough to accommodate the entire walker and yourself:  First, place the walker up on the step.  Second, get your feet as close to the step as you can.  Third, press down on the walker with your hands as you step up with your stronger leg. Then step up with your weaker leg.  If you are going down a step wide enough to accommodate the entire walker and yourself:  First, place the walker down on the step.  Second, hold onto the walker as you step down with your weaker leg. Then step down with your stronger leg.  If you are going up more than 1 step and have a railing:  First, turn the walker sideways, so the opening is facing in toward you.  Second, place the front 2 legs of the walker on the first step. These front legs should be positioned at the base of the next step.  Third, test the steadiness of the walker. It should feel sturdy when you press down on the handgrip that is facing the top of the steps.  Finally, placing your weight on the railing and the walker, step up with your stronger leg first. Then step up with your weaker leg.  If you are going down more than 1 step and have a railing:  First, turn the walker sideways, so the opening is facing in toward you.  Second, place the front 2 legs of the walker down on the first step. When possible, the back legs of the walker should be positioned at the base of the previous step.  Third, test the steadiness of the walker. It should feel sturdy when you press down on the handgrip that is facing the top of the steps.  Finally, placing your weight on the railing and the walker, step down with your weaker leg first. Then step down with your stronger leg.  Be sure to check the  sturdiness of the walker before each step.  Make sure you have good rubber tips on the legs of your walker to prevent it from slipping.   This information is not intended to replace advice given to you by your health care provider. Make sure you discuss any questions you have with your health care provider.    [facility]  [address] [place], [place] [address]  [phone] (office) [phone] (fax)

## 2015-10-16 NOTE — Telephone Encounter (Signed)
Pt spoke with [employer] and was told we can return the brace for her if we write a letter with it. Please assist.

## 2015-10-18 ENCOUNTER — Encounter: Attending: Internal Medicine | Primary: Internal Medicine

## 2015-10-18 ENCOUNTER — Ambulatory Visit
Admit: 2015-10-18 | Discharge: 2015-10-18 | Payer: PRIVATE HEALTH INSURANCE | Attending: Internal Medicine | Primary: Internal Medicine

## 2015-10-18 DIAGNOSIS — R0609 Other forms of dyspnea: Secondary | ICD-10-CM

## 2015-10-18 NOTE — Progress Notes (Signed)
64 y.o. white female who presents for f/u    No exercise mainly due to motivational issues and a busy schedule although she plans on retiring this coming new year.  She has been having episodic palpitation.  Also, she's noted that after walking a fair distance, she now feels winded which is new.  No associated cp, pnd, edema    She never did get the sleep eval done but is wanting to do it now prior to retirement    FBS <110, not checking pps.  Denies polyuria, polydipsia, nocturia, vision change.  Weight is about the same    Vitals 10/18/2015 07/17/2015 04/18/2015 12/05/2014 05/25/2014   Weight 172 lb 166 lb 166 lb 168 lb 167 lb     Vitals 11/15/2013 09/22/2013   Weight 168 lb 166 lb     Wants a mammo as she is overdue    Also needs colo done.  No gi or gu complaints    Past Medical History   Diagnosis Date   ??? Allergic rhinitis    ??? Anxiety state, unspecified    ??? Arthritis      Dr. Synetta Shadow, Dr. Marisa Hua   ??? Compression fx, thoracic spine [facility]) 2007     negative DEXA Dr. Marisa Hua   ??? Dyslipidemia    ??? Dyspepsia    ??? Frozen shoulder      right Dr. Wynonia Hazard MRI   ??? Multiple lung nodules      no change 10/06, 03/07, 03/08   ??? Overweight (BMI 25.0-29.9)    ??? Palpitations 2008     neg thallium 2008, nl holter 2008, echo nl lv/ef 65%/tr mr/dd/nl pasp   ??? Prediabetes    ??? 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 Venous insufficiency    ??? Vitamin D deficiency      Past Surgical History   Procedure Laterality Date   ??? Hx hemorrhoidectomy       Dr. Rosendo Gros 2007   ??? Echo 2d adult  12/04     normal with EF 70%   ??? Stress test thallium study  2005     negative   ??? Vas carotid duplex bilateral  2004     negative   ??? [place] abd comp  9/05     negative   ??? Hx gyn       s/p BTL    ??? Hx colonoscopy       Dr Rosendo Gros 2007 negative     Social History     Social History   ??? Marital status: MARRIED     Spouse name: N/A   ??? Number of children: 2   ??? 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 Smoking status: Never Smoker   ??? Smokeless tobacco: Not on file   ??? Alcohol use 2.4 oz/week     4 Glasses of wine per week   ??? Drug use: No   ??? Sexual activity: Not on file     Other Topics Concern   ??? Not on file     Social History Narrative     Current Outpatient Prescriptions   Medication Sig   ??? lansoprazole (PREVACID) 30 mg capsule take 1 capsule by mouth once daily BEFORE BREAKFAST   ??? VITAMIN D2 50,000 unit capsule take 1 capsule by mouth every week   ???  omega-3 fatty acids (FISH OIL) cap Take 2 Tabs by mouth daily.   ??? multivitamin (ONE A DAY) tablet Take 1 Tab by mouth daily.   ??? aspirin 81 mg chewable tablet Take 81 mg by mouth daily.   ??? atorvastatin (LIPITOR) 40 mg tablet take 1 tablet by mouth daily   ??? citalopram (CELEXA) 10 mg tablet take 1 tablet by mouth once daily     No current facility-administered medications for this visit.      Allergies   Allergen Reactions   ??? Fish Oil Nausea Only   ??? Relafen [Nabumetone] Nausea Only     REVIEW OF SYSTEMS: gyn 2012 Dr. Gunnar Bulla in August, [place] 6/14, colo 2007 Dr Rosendo Gros, [place] 2007? Dr Margaretann Loveless ??? no vision change or eye pain  Oral ??? no mouth pain, tongue or tooth problems  Ears ??? no hearing loss, ear pain, fullness, no swallowing problems  Cardiac ??? no CP, PND, orthopnea, edema, palpitations or syncope  Chest ??? no breast masses  Resp ??? no wheezing, chronic coughing, dyspnea  Urinary ??? no dysuria, hematuria, flank pain, urgency, frequency  Ortho ??? no swelling, dec ROM, myalgias  Psych ??? denies any anxiety or depression symptoms, no hallucinations or violent ideation  Endo - no polyuria, polydipsia, nocturia, hot flashes    Visit Vitals   ??? BP 120/76   ??? Pulse 98   ??? Temp 98.1 ??F (36.7 ??C) (Oral)   ??? Ht '5\' 4"'  (1.626 m)   ??? Wt 172 lb (78 kg)   ??? SpO2 96%   ??? BMI 29.52 kg/m2   Affect is appropriate.  Mood stable  No apparent distress  HEENT --Anicteric sclerae.  No thyromegaly, JVD, or bruits.     Lungs --Clear to auscultation and percussion, normal percussion.  Heart --Regular rate and rhythm, no murmurs, rubs, gallops, or clicks.  Abdomen -- Soft and nontender, no hepatosplenomegaly or masses.  Extremities -- Without cyanosis, clubbing, edema. 2+ pulses equally and bilaterally.    LABS  From 5/10 showed   gluc 110, cr 0.70,               alt 23,                                    chol 154, tg 143, hdl 45, ldl-c 80,                                                            tsh 1.91  From 5/10 showed                      2 hr GTT 89  From 8/10 showed                                                               vit d 23.0, ck 57, aldo 5.4  From 8/11 showed   gluc 108,  hba1c 6.3,                   chol 160, tg 172, hdl 39, ldl-c 87,  wbc 5.,7 hb 12.3, plt 235, ua neg,     tsh 2.28  From 5/12 showed   gluc 113, cr 0.71, gfr 94,  alt 16, hba1c 6.2, ldl-p 1989, chol 177, tg 161, hdl 43, ldl-c 102  From 11/12 showed                                                     hba1c 5.9, ldl-p 1218, chol 133, tg 116, hdl 45, ldl-c 65  From 5/13 showed   gluc 105, cr 0.55, gfr 102, alt 8,  hba1c 6.2,                   chol 148, tg 107, hdl 45, ldl-c 82,  wbc 5.0, hb 12.5, plt 172, vit d 40.3  From 11/13 showed        hba1c 6.2, ldl-p 1888, chol 176, tg 155, hdl 49, ldl-c 86,  wbc 6.2, hb 12.3, plt 182, vit d 34.4  From 5/14 showed        hba1c 6.2,     chol 152, tg 157, hdl 39, ldl-c 82  From 1/15 showed   gluc 113, cr 0.68, gfr>60, alt 11, hba1c 6.2,     chol 146, tg 130, hdl 43, ldl-c 77  From 7/15 showed        hba1c 6.3,     chol 133, tg 124, hdl 39, ldl-c 69, wbc 6.3, hb 12.2, plt 193  From 6/16 showed   gluc 106, cr 0.74, gfr>60, alt 26, hba1c 6.3,     chol 126, tg 125, hdl 46, ldl-c 55, wbc 5.7, hb 13.2, plt 203, vit d 7.2, tsh 1.69    Results for orders placed or performed during the hospital encounter of 10/11/15   CBC W/O DIFF   Result Value Ref Range     WBC 6.0 4.6 - 13.2 K/uL    RBC 4.31 4.20 - 5.30 M/uL    HGB 12.7 12.0 - 16.0 g/dL    HCT 41.6 35.0 - 45.0 %    MCV 96.5 74.0 - 97.0 FL    MCH 29.5 24.0 - 34.0 PG    MCHC 30.5 (L) 31.0 - 37.0 g/dL    RDW 13.4 11.6 - 14.5 %    PLATELET 197 135 - 420 K/uL    MPV 9.9 9.2 - 11.8 FL   LIPID PANEL   Result Value Ref Range    LIPID PROFILE          Cholesterol, total 135 <200 MG/DL    Triglyceride 147 <150 MG/DL    HDL Cholesterol 47 40 - 60 MG/DL    LDL, calculated 58.6 0 - 100 MG/DL    VLDL, calculated 29.4 MG/DL    CHOL/HDL Ratio 2.9 0 - 5.0     METABOLIC PANEL, COMPREHENSIVE   Result Value Ref Range    Sodium 142 136 - 145 mmol/L    Potassium 4.0 3.5 - 5.5 mmol/L    Chloride 105 100 - 108 mmol/L    CO2 30 21 - 32 mmol/L    Anion gap 7 3.0 - 18 mmol/L    Glucose 110 (H)  74 - 99 mg/dL    BUN 11 7.0 - 18 MG/DL    Creatinine 0.68 0.6 - 1.3 MG/DL    BUN/Creatinine ratio 16 12 - 20      GFR est AA >60 >60 ml/min/1.80m    GFR est non-AA >60 >60 ml/min/1.726m   Calcium 8.8 8.5 - 10.1 MG/DL    Bilirubin, total 0.3 0.2 - 1.0 MG/DL    ALT 30 13 - 56 U/L    AST 18 15 - 37 U/L    Alk. phosphatase 88 45 - 117 U/L    Protein, total 6.9 6.4 - 8.2 g/dL    Albumin 3.8 3.4 - 5.0 g/dL    Globulin 3.1 2.0 - 4.0 g/dL    A-G Ratio 1.2 0.8 - 1.7     HEMOGLOBIN A1C W/O EAG   Result Value Ref Range    Hemoglobin A1c 6.1 (H) 4.2 - 5.6 %     EKG showed nsr, rsr', non diagnostic q waves 3/f    Patient Active Problem List   Diagnosis Code   ??? Vitamin D deficiency E55.9   ??? Anxiety F41.9   ??? Dyslipidemia E78.5   ??? 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 Overweight (BMI 25.0-29.9) E66.3   ??? Prediabetes R73.03     Assessment and plan:  1. Prediabetes. Lifestyle and dietary modification reiterated. Weight loss   2. Hyperlipidemia. Continue current  3. Hypovitaminosis D. Continue supp  4. Anxiety.  Continue celexa  5. Overweight.  Dec app suppressants in past, reiterated portion control and 150 min/week exercise  6. R/O sleep apnea.  sched Dr BaZerita Boers 7. R/O CHD.  Sched stress thallium and go from there  8. Breast.  mammo ordered   9. She has some skin lesions she wants checked so we gave her Dr WaCheron Schaumannname      RTC 6/17    Above conditions discussed at length and patient vocalized understanding.  All questions answered to patient satifaction

## 2015-10-18 NOTE — Progress Notes (Signed)
1. Have you been to the ER, urgent care clinic or hospitalized since your last visit? NO.     2. Have you seen or consulted any other health care providers outside of the [facility] since your last visit (Include any pap smears or colon screening)? NO      Do you have an Advanced Directive? NO    Would you like information on Advanced Directives? YES

## 2015-10-23 ENCOUNTER — Telehealth: Payer: Self-pay | Admitting: Neurology

## 2015-10-23 NOTE — Telephone Encounter (Signed)
The results of the cardiac monitor study were sent to the patient via my chart. Study was unremarkable.   sinus rhythm  no significant arrhythmias

## 2015-10-24 ENCOUNTER — Encounter: Payer: Medicare Other | Admitting: Physical Therapy

## 2015-10-30 ENCOUNTER — Other Ambulatory Visit: Payer: Self-pay | Admitting: Family Medicine

## 2015-10-30 ENCOUNTER — Other Ambulatory Visit: Payer: Self-pay | Admitting: Neurology

## 2015-10-30 DIAGNOSIS — J3489 Other specified disorders of nose and nasal sinuses: Secondary | ICD-10-CM | POA: Diagnosis not present

## 2015-10-30 DIAGNOSIS — H90A12 Conductive hearing loss, unilateral, left ear with restricted hearing on the contralateral side: Secondary | ICD-10-CM | POA: Diagnosis not present

## 2015-10-30 DIAGNOSIS — R51 Headache: Secondary | ICD-10-CM | POA: Diagnosis not present

## 2015-11-01 ENCOUNTER — Other Ambulatory Visit: Payer: Self-pay | Admitting: Family Medicine

## 2015-11-01 MED ORDER — METFORMIN HCL 500 MG PO TABS
ORAL_TABLET | ORAL | Status: DC
Start: 2015-11-01 — End: 2016-04-28

## 2015-11-05 ENCOUNTER — Telehealth: Payer: Self-pay | Admitting: Neurology

## 2015-11-05 NOTE — Telephone Encounter (Signed)
I called the patient. I explained that the cardiac monitor results were normal. She asked if she could start driving again. I advised that I would ask [name]. She stated that her balance still feels off but she has not had anymore spells of passing out. She stated that her B12 was low at her last PCP visit and she was started on vit B12 injections. She wondered if this could have contributed. I advised we would call her back about being able to drive.
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
## 2015-11-05 NOTE — Telephone Encounter (Signed)
Patient is calling and would like for a callback in regard to the cardiac monitor study [phone].  Thanks!

## 2015-11-06 DIAGNOSIS — Z961 Presence of intraocular lens: Secondary | ICD-10-CM | POA: Diagnosis not present

## 2015-11-06 DIAGNOSIS — H35371 Puckering of macula, right eye: Secondary | ICD-10-CM | POA: Diagnosis not present

## 2015-11-06 NOTE — Telephone Encounter (Signed)
I called the patient. Appointment scheduled 11/15/15.

## 2015-11-07 ENCOUNTER — Encounter

## 2015-11-08 ENCOUNTER — Encounter: Payer: Self-pay | Admitting: Physician Assistant

## 2015-11-08 ENCOUNTER — Ambulatory Visit (INDEPENDENT_AMBULATORY_CARE_PROVIDER_SITE_OTHER): Payer: Medicare Other | Admitting: Physician Assistant

## 2015-11-08 VITALS — BP 127/66 | HR 63 | Ht 66.0 in | Wt 168.0 lb

## 2015-11-08 DIAGNOSIS — R21 Rash and other nonspecific skin eruption: Secondary | ICD-10-CM

## 2015-11-08 MED ORDER — DESONIDE 0.05 % EX CREA: TOPICAL_CREAM | Freq: Two times a day (BID) | CUTANEOUS | Status: DC

## 2015-11-08 NOTE — Progress Notes (Signed)
   Subjective:    Patient ID: Mia [name], female    DOB: August 17, 1951, 65 y.o.   MRN: [id number]  HPI Pt is a 65 yo female who presents to the clinic with her husband. She has a small circular rash on her left cheek. Noticed for last week. No new creams or make up. Feels more burny than itching. No fever, chills, n/v/d. Not tried anything to make better. Does not seem to be getting bigger. Never had anything like it before. No drainage or vesciles.    Review of Systems See HPI.     Objective:   Physical Exam  HENT:  Head:            Assessment & Plan:  Rash- unclear etiology. Does not appear like a bug bite, ringworm, seb keratosis, or basal cell. Treated as atopic dermatitis. Desonide given bid for next 2weeks. Follow up if not improving.

## 2015-11-15 ENCOUNTER — Ambulatory Visit (INDEPENDENT_AMBULATORY_CARE_PROVIDER_SITE_OTHER): Payer: Medicare Other | Admitting: Neurology

## 2015-11-15 ENCOUNTER — Encounter: Payer: Self-pay | Admitting: Neurology

## 2015-11-15 ENCOUNTER — Ambulatory Visit (INDEPENDENT_AMBULATORY_CARE_PROVIDER_SITE_OTHER): Payer: Medicare Other | Admitting: Family Medicine

## 2015-11-15 VITALS — BP 97/46 | HR 73

## 2015-11-15 VITALS — BP 135/76 | HR 58 | Ht 66.0 in | Wt 166.0 lb

## 2015-11-15 DIAGNOSIS — R413 Other amnesia: Secondary | ICD-10-CM

## 2015-11-15 DIAGNOSIS — R269 Unspecified abnormalities of gait and mobility: Secondary | ICD-10-CM

## 2015-11-15 DIAGNOSIS — M797 Fibromyalgia: Secondary | ICD-10-CM

## 2015-11-15 DIAGNOSIS — R55 Syncope and collapse: Secondary | ICD-10-CM | POA: Diagnosis not present

## 2015-11-15 DIAGNOSIS — D510 Vitamin B12 deficiency anemia due to intrinsic factor deficiency: Secondary | ICD-10-CM | POA: Diagnosis not present

## 2015-11-15 DIAGNOSIS — Z5181 Encounter for therapeutic drug level monitoring: Secondary | ICD-10-CM

## 2015-11-15 MED ORDER — CYANOCOBALAMIN 1000 MCG/ML IJ SOLN
1000.0000 ug | Freq: Once | INTRAMUSCULAR | Status: AC
  Administered 2015-11-15: 1000 ug via INTRAMUSCULAR

## 2015-11-15 MED ORDER — DONEPEZIL HCL 5 MG PO TABS: 5.0000 mg | ORAL_TABLET | Freq: Every day | ORAL | Status: DC

## 2015-11-15 NOTE — Progress Notes (Signed)
   Subjective:    Patient ID: Mia [name], female    DOB: 09/29/1951, 65 y.o.   MRN: [id number]  HPI  Mia [name] is here for vitamin B 12 injection. Denies any problems with the medication.   Review of Systems     Objective:   Physical Exam        Assessment & Plan:  Pernicious Anemia - Patient tolerated injection well without any complications.

## 2015-11-15 NOTE — Patient Instructions (Signed)
Fall Prevention in the Home  Falls can cause injuries and can affect people from all age groups. There are many simple things that you can do to make your home safe and to help prevent falls. WHAT CAN I DO ON THE OUTSIDE OF MY HOME?  Regularly repair the edges of walkways and driveways and fix any cracks.  Remove high doorway thresholds.  Trim any shrubbery on the main path into your home.  Use bright outdoor lighting.  Clear walkways of debris and clutter, including tools and rocks.  Regularly check that handrails are securely fastened and in good repair. Both sides of any steps should have handrails.  Install guardrails along the edges of any raised decks or porches.  Have leaves, snow, and ice cleared regularly.  Use sand or salt on walkways during winter months.  In the garage, clean up any spills right away, including grease or oil spills. WHAT CAN I DO IN THE BATHROOM?  Use night lights.  Install grab bars by the toilet and in the tub and shower. Do not use towel bars as grab bars.  Use non-skid mats or decals on the floor of the tub or shower.  If you need to sit down while you are in the shower, use a plastic, non-slip stool..  Keep the floor dry. Immediately clean up any water that spills on the floor.  Remove soap buildup in the tub or shower on a regular basis.  Attach bath mats securely with double-sided non-slip rug tape.  Remove throw rugs and other tripping hazards from the floor. WHAT CAN I DO IN THE BEDROOM?  Use night lights.  Make sure that a bedside light is easy to reach.  Do not use oversized bedding that drapes onto the floor.  Have a firm chair that has side arms to use for getting dressed.  Remove throw rugs and other tripping hazards from the floor. WHAT CAN I DO IN THE KITCHEN?   Clean up any spills right away.  Avoid walking on wet floors.  Place frequently used items in easy-to-reach places.  If you need to reach for something  above you, use a sturdy step stool that has a grab bar.  Keep electrical cables out of the way.  Do not use floor polish or wax that makes floors slippery. If you have to use wax, make sure that it is non-skid floor wax.  Remove throw rugs and other tripping hazards from the floor. WHAT CAN I DO IN THE STAIRWAYS?  Do not leave any items on the stairs.  Make sure that there are handrails on both sides of the stairs. Fix handrails that are broken or loose. Make sure that handrails are as long as the stairways.  Check any carpeting to make sure that it is firmly attached to the stairs. Fix any carpet that is loose or worn.  Avoid having throw rugs at the top or bottom of stairways, or secure the rugs with carpet tape to prevent them from moving.  Make sure that you have a light switch at the top of the stairs and the bottom of the stairs. If you do not have them, have them installed. WHAT ARE SOME OTHER FALL PREVENTION TIPS?  Wear closed-toe shoes that fit well and support your feet. Wear shoes that have rubber soles or low heels.  When you use a stepladder, make sure that it is completely opened and that the sides are firmly locked. Have someone hold the ladder while you   are using it. Do not climb a closed stepladder.  Add color or contrast paint or tape to grab bars and handrails in your home. Place contrasting color strips on the first and last steps.  Use mobility aids as needed, such as canes, walkers, scooters, and crutches.  Turn on lights if it is dark. Replace any light bulbs that burn out.  Set up furniture so that there are clear paths. Keep the furniture in the same spot.  Fix any uneven floor surfaces.  Choose a carpet design that does not hide the edge of steps of a stairway.  Be aware of any and all pets.  Review your medicines with your healthcare provider. Some medicines can cause dizziness or changes in blood pressure, which increase your risk of falling. Talk  with your health care provider about other ways that you can decrease your risk of falls. This may include working with a physical therapist or trainer to improve your strength, balance, and endurance.   This information is not intended to replace advice given to you by your health care provider. Make sure you discuss any questions you have with your health care provider.   Document Released: 10/02/2002 Document Revised: 02/26/2015 Document Reviewed: 11/16/2014 Elsevier Interactive Patient Education 2016 [employer]  
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
## 2015-11-16 LAB — COMPREHENSIVE METABOLIC PANEL
ALT: 23 IU/L (ref 0–32)
AST: 20 IU/L (ref 0–40)
Albumin/Globulin Ratio: 1.8 (ref 1.1–2.5)
Albumin: 4.8 g/dL (ref 3.6–4.8)
Alkaline Phosphatase: 76 IU/L (ref 39–117)
BUN/Creatinine Ratio: 21 (ref 11–26)
BUN: 18 mg/dL (ref 8–27)
Bilirubin Total: <0.2 mg/dL (ref 0.0–1.2)
CO2: 24 mmol/L (ref 18–29)
Calcium: 9.6 mg/dL (ref 8.7–10.3)
Chloride: 97 mmol/L (ref 96–106)
Creatinine, Ser: 0.85 mg/dL (ref 0.57–1.00)
GFR calc Af Amer: 84 mL/min/{1.73_m2} (ref >59)
GFR calc non Af Amer: 73 mL/min/{1.73_m2} (ref >59)
Globulin, Total: 2.6 g/dL (ref 1.5–4.5)
Glucose: 95 mg/dL (ref 65–99)
Potassium: 4.3 mmol/L (ref 3.5–5.2)
Sodium: 140 mmol/L (ref 134–144)
Total Protein: 7.4 g/dL (ref 6.0–8.5)

## 2015-11-16 LAB — CK: Total CK: 89 U/L (ref 24–173)

## 2015-11-19 ENCOUNTER — Ambulatory Visit (INDEPENDENT_AMBULATORY_CARE_PROVIDER_SITE_OTHER): Payer: Medicare Other | Admitting: Sports Medicine

## 2015-11-19 ENCOUNTER — Other Ambulatory Visit: Payer: Self-pay | Admitting: Sports Medicine

## 2015-11-19 ENCOUNTER — Encounter: Payer: Self-pay | Admitting: Sports Medicine

## 2015-11-19 VITALS — BP 142/79 | HR 69 | Resp 18 | Wt 163.7 lb

## 2015-11-19 DIAGNOSIS — M5412 Radiculopathy, cervical region: Secondary | ICD-10-CM

## 2015-11-19 MED ORDER — CYCLOBENZAPRINE HCL 10 MG PO TABS: ORAL_TABLET | ORAL | Status: DC

## 2015-11-19 MED ORDER — KETOROLAC TROMETHAMINE 30 MG/ML IJ SOLN
30.0000 mg | Freq: Once | INTRAMUSCULAR | Status: AC
  Administered 2015-11-19: 30 mg via INTRAMUSCULAR

## 2015-11-19 MED ORDER — PREDNISONE 50 MG PO TABS: ORAL_TABLET | ORAL | Status: DC

## 2015-11-19 NOTE — Assessment & Plan Note (Signed)
Overall did extremely well with the ACDF however started to have a recurrence of pain in the left arm in a C6 distribution. MRI for interventional planning, with IV contrast. Toradol 30, prednisone, Flexeril. Normal renal function from 4 days ago.

## 2015-11-19 NOTE — Addendum Note (Signed)
Addended by: Elizabeth Sauer on: 11/19/2015 03:02 PM   Modules accepted: Orders

## 2015-11-19 NOTE — Progress Notes (Signed)
  Subjective:    CC: neck and shoulder pain  HPI: This is a pleasant 65 year old female post C5-C6 ACDF with good initial relief of left-sided cervical radiculopathy. For the past several weeks she's had an increase of pain with radiation down the left arm, severe with mild lower extremity weakness.  Past medical history, Surgical history, Family history not pertinant except as noted below, Social history, Allergies, and medications have been entered into the medical record, reviewed, and no changes needed.   Review of Systems: No fevers, chills, night sweats, weight loss, chest pain, or shortness of breath.   Objective:    General: Well Developed, well nourished, and in no acute distress.  Neuro: Alert and oriented x3, extra-ocular muscles intact, sensation grossly intact.  HEENT: Normocephalic, atraumatic, pupils equal round reactive to light, neck supple, no masses, no lymphadenopathy, thyroid nonpalpable.  Skin: Warm and dry, no rashes. Cardiac: Regular rate and rhythm, no murmurs rubs or gallops, no lower extremity edema.  Respiratory: Clear to auscultation bilaterally. Not using accessory muscles, speaking in full sentences. Neck: Motion limited by pain Grip strength and sensation normal in bilateral hands Strength good C4 to T1 distribution No sensory change to C4 to T1 Reflexes normal  Impression and Recommendations:    I spent 25 minutes with this patient, greater than 50% was face-to-face time counseling regarding the above diagnoses

## 2015-11-20 NOTE — Progress Notes (Signed)
Unable to reach patient to confirm appointment.

## 2015-11-21 ENCOUNTER — Inpatient Hospital Stay
Admit: 2015-11-21 | Discharge: 2015-11-21 | Payer: BLUE CROSS/BLUE SHIELD | Attending: Internal Medicine | Primary: Internal Medicine

## 2015-11-21 ENCOUNTER — Inpatient Hospital Stay: Admit: 2015-11-21 | Payer: BLUE CROSS/BLUE SHIELD | Attending: Internal Medicine | Primary: Internal Medicine

## 2015-11-21 DIAGNOSIS — R0609 Other forms of dyspnea: Secondary | ICD-10-CM

## 2015-11-21 LAB — NUCLEAR STRESS TEST
ECG Interp. Before Exercise: NORMAL
Max. Diastolic BP: 60 mmHg
Max. Heart rate: 136 {beats}/min
Max. Systolic BP: 130 mmHg
Peak Ex METs: 1.5 METS

## 2015-11-21 MED ORDER — SODIUM CHLORIDE 0.9 % IJ SYRG
INTRAMUSCULAR | Status: AC | PRN
Start: 2015-11-21 — End: 2015-11-21
  Administered 2015-11-21 (×2): via INTRAVENOUS

## 2015-11-21 MED ORDER — REGADENOSON 0.4 MG/5 ML IV SYRINGE
0.4 mg/5 mL | Freq: Once | INTRAVENOUS | Status: AC
Start: 2015-11-21 — End: 2015-11-21
  Administered 2015-11-21: 15:00:00 via INTRAVENOUS

## 2015-11-21 MED FILL — LEXISCAN 0.4 MG/5 ML INTRAVENOUS SYRINGE: 0.4 mg/5 mL | INTRAVENOUS | Qty: 5

## 2015-11-21 MED FILL — BD POSIFLUSH NORMAL SALINE 0.9 % INJECTION SYRINGE: INTRAMUSCULAR | Qty: 10

## 2015-11-21 NOTE — Progress Notes (Signed)
Patient was injected with 0000000 millicuries Q000111Q Sestamibi on 11/21/15 at 0830.    Patient was injected with 123456 millicuries Q000111Q Sestamibi on  11/21/15 at 0930.    Patient's armbands were removed and placed in shred-it box.    Patient had a Hydrologist Stress Test.

## 2015-11-21 NOTE — Progress Notes (Signed)
Message left for patient to return call to go over results

## 2015-11-21 NOTE — Procedures (Signed)
[place] STRESS    Name:  Laura Roman, Laura Roman  MR#:  [id number]  DOB:  [birth year]  Account #:  [id number]  Date of Adm:  11/21/2015  Date of Service:  11/21/2015      PHARMACOLOGIC NUCLEAR STRESS TEST    REFERRING PHYSICIAN: Lamount Cohen, MD    INDICATION: Dyspnea on exertion.    Resting heart rate 71, blood pressure 120/70.    Resting EKG shows sinus rhythm, no ST or T wave changes. Normal  EKG.    PHARMACOLOGICAL STRESS PORTION: The patient underwent  Lexiscan infusion via the left arm IV and then did a low-level 3-minute  walk. The patient remained in sinus rhythm without any EKG changes  concerning for ischemia. There were no arrhythmias present during the  test. Overall, this is a negative EKG portion of the stress test.    PERFUSION IMAGING: The patient received intravenous technetium-  5m sestamibi 11.0 mCi intravenously via the left arm for the resting  images and then 33.0 mCi via the same IV an hour later.    Tomographic views of the left ventricle were obtained and compared.  Cavity size was normal with both rest and stress imaging. There is no  transient ischemic dilatation present. There is uniform radiotracer  uptake throughout the left ventricular myocardium during both stress and rest  imaging without any significant perfusion imaging abnormalities  concerning for ischemia or infarction.    Gated analysis showed normal LV size and systolic function, and  normal regional wall motion with ejection fraction calculated at greater  than 75%.    CONCLUSIONS  1. Normal and low-risk pharmacological nuclear stress test.  2. No perfusion imaging abnormalities concerning for ischemia or  infarction.  3. Normal left ventricular size and systolic function with ejection  fraction calculated at greater than 75%.        Chaney Malling, MD    MR / VW  D:  11/21/2015   17:33  T:  11/22/2015   06:03  Job #:  [id number]

## 2015-11-22 NOTE — Procedures (Signed)
[place] STRESS    Name:  Laura Roman, Laura Roman  MR#:  [id number]  DOB:  December 19, 1950  Account #:  [id number]  Date of Adm:  11/21/2015  Date of Service:  11/21/2015      PHARMACOLOGIC NUCLEAR STRESS TEST    REFERRING PHYSICIAN: Lamount Cohen, MD    INDICATION: Dyspnea on exertion.    Resting heart rate 71, blood pressure 120/70.    Resting EKG shows sinus rhythm, no ST or T wave changes. Normal  EKG.    PHARMACOLOGICAL STRESS PORTION: The patient underwent  Lexiscan infusion via the left arm IV and then did a low-level 3-minute  walk. The patient remained in sinus rhythm without any EKG changes  concerning for ischemia. There were no arrhythmias present during the  test. Overall, this is a negative EKG portion of the stress test.    PERFUSION IMAGING: The patient received intravenous technetium-  31m sestamibi 11.0 mCi intravenously via the left arm for the resting  images and then 33.0 mCi via the same IV an hour later.    Tomographic views of the left ventricle were obtained and compared.  Cavity size was normal with both rest and stress imaging. There is no  transient ischemic dilatation present. There is uniform radiotracer  uptake throughout the left ventricular myocardium during both stress and rest  imaging without any significant perfusion imaging abnormalities  concerning for ischemia or infarction.    Gated analysis showed normal LV size and systolic function, and  normal regional wall motion with ejection fraction calculated at greater  than 75%.    CONCLUSIONS  1. Normal and low-risk pharmacological nuclear stress test.  2. No perfusion imaging abnormalities concerning for ischemia or  infarction.  3. Normal left ventricular size and systolic function with ejection  fraction calculated at greater than 75%.        Chaney Malling, MD    MR / VW  D:  11/21/2015   17:33  T:  11/22/2015   06:03  Job #:  [id number]

## 2015-11-23 ENCOUNTER — Ambulatory Visit (HOSPITAL_BASED_OUTPATIENT_CLINIC_OR_DEPARTMENT_OTHER)
Admission: RE | Admit: 2015-11-23 | Discharge: 2015-11-23 | Disposition: A | Discharge status: Home / Self Care | Payer: Medicare Other | Source: Ambulatory Visit | Attending: Sports Medicine | Admitting: Sports Medicine

## 2015-11-23 DIAGNOSIS — M4692 Unspecified inflammatory spondylopathy, cervical region: Secondary | ICD-10-CM | POA: Insufficient documentation

## 2015-11-23 DIAGNOSIS — Z981 Arthrodesis status: Secondary | ICD-10-CM | POA: Diagnosis not present

## 2015-11-23 DIAGNOSIS — M4802 Spinal stenosis, cervical region: Secondary | ICD-10-CM | POA: Insufficient documentation

## 2015-11-23 DIAGNOSIS — M50223 Other cervical disc displacement at C6-C7 level: Secondary | ICD-10-CM | POA: Diagnosis not present

## 2015-11-23 DIAGNOSIS — M5412 Radiculopathy, cervical region: Secondary | ICD-10-CM | POA: Insufficient documentation

## 2015-11-23 MED ORDER — GADOBENATE DIMEGLUMINE 529 MG/ML IV SOLN: 15.0000 mL | Freq: Once | INTRAVENOUS | Status: DC | PRN

## 2015-11-24 NOTE — Telephone Encounter (Signed)
Call result

## 2015-11-26 ENCOUNTER — Ambulatory Visit (INDEPENDENT_AMBULATORY_CARE_PROVIDER_SITE_OTHER): Payer: Medicare Other | Admitting: Sports Medicine

## 2015-11-26 DIAGNOSIS — M5382 Other specified dorsopathies, cervical region: Secondary | ICD-10-CM

## 2015-11-26 DIAGNOSIS — M47812 Spondylosis without myelopathy or radiculopathy, cervical region: Secondary | ICD-10-CM

## 2015-11-26 NOTE — Progress Notes (Signed)
  Subjective:    CC: MRI results  HPI: Mia [name] is a pleasant 65 year old female, she's had chronic neck pain for some time now, she is post a C5-C6 fusion. She has had pain that she localizes in the left side of her neck with radiation into the left upper shoulder and trapezius but really not past the shoulder without any extremity paresthesias. Pain is significantly worse in the morning, and worse with left rotation as well as extension of her neck. No trauma. We treated her aggressively with steroids and muscle relaxers at the last visit which improved some of her symptoms but she continues to have pain.  Past medical history, Surgical history, Family history not pertinant except as noted below, Social history, Allergies, and medications have been entered into the medical record, reviewed, and no changes needed.   Review of Systems: No fevers, chills, night sweats, weight loss, chest pain, or shortness of breath.   Objective:    General: Well Developed, well nourished, and in no acute distress.  Neuro: Alert and oriented x3, extra-ocular muscles intact, sensation grossly intact.  HEENT: Normocephalic, atraumatic, pupils equal round reactive to light, neck supple, no masses, no lymphadenopathy, thyroid nonpalpable.  Skin: Warm and dry, no rashes. Cardiac: Regular rate and rhythm, no murmurs rubs or gallops, no lower extremity edema.  Respiratory: Clear to auscultation bilaterally. Not using accessory muscles, speaking in full sentences.  MRI personally reviewed, there is some mild adjacent level disc disease, however the predominant finding is left-sided C4-C5 facet arthropathy with edema. She does have milder facet arthropathy at other levels.  Impression and Recommendations:

## 2015-11-26 NOTE — Assessment & Plan Note (Signed)
MRI shows good fusion at C5-C6 level with mild adjacent degenerative changes. There is marked facet arthritis on the left at the C4-C5 level with edema. There is also multilevel degenerative changes at the C6-7, and the C7-T1 levels. Pain does sound predominantly facetogenic, so we are going to proceed initially with a left C4-C5 facet joint injection.

## 2015-11-26 NOTE — Telephone Encounter (Signed)
Patient notified of stress test results 

## 2015-11-29 ENCOUNTER — Telehealth: Payer: Self-pay

## 2015-11-29 NOTE — Telephone Encounter (Signed)
Mia [name] states she has not been contacted to schedule the neck injections. Can we call the company to find out if they have tried to contact patient.

## 2015-12-02 NOTE — Telephone Encounter (Signed)
Called [facility] Friday and will call her to schedule.

## 2015-12-04 ENCOUNTER — Ambulatory Visit
Admission: RE | Admit: 2015-12-04 | Discharge: 2015-12-04 | Disposition: A | Discharge status: Home / Self Care | Payer: Medicare Other | Source: Ambulatory Visit | Attending: Sports Medicine | Admitting: Sports Medicine

## 2015-12-04 VITALS — BP 115/70 | HR 82

## 2015-12-04 DIAGNOSIS — M542 Cervicalgia: Secondary | ICD-10-CM | POA: Diagnosis not present

## 2015-12-04 DIAGNOSIS — M47812 Spondylosis without myelopathy or radiculopathy, cervical region: Secondary | ICD-10-CM

## 2015-12-04 MED ORDER — TRIAMCINOLONE ACETONIDE 40 MG/ML IJ SUSP (RADIOLOGY): 60.0000 mg | Freq: Once | INTRAMUSCULAR | Status: DC

## 2015-12-04 MED ORDER — DEXAMETHASONE SODIUM PHOSPHATE 4 MG/ML IJ SOLN
4.0000 mg | Freq: Once | INTRAMUSCULAR | Status: AC
  Administered 2015-12-04: 4 mg via INTRA_ARTICULAR

## 2015-12-04 MED ORDER — IOHEXOL 300 MG/ML  SOLN
1.0000 mL | Freq: Once | INTRAMUSCULAR | Status: AC | PRN
  Administered 2015-12-04: 1 mL via INTRA_ARTICULAR

## 2015-12-04 NOTE — Discharge Instructions (Signed)

## 2015-12-05 ENCOUNTER — Telehealth

## 2015-12-05 NOTE — Telephone Encounter (Signed)
Patient aware of message below.

## 2015-12-05 NOTE — Telephone Encounter (Signed)
pls call    Abn mammo  sched diagnostic

## 2015-12-09 ENCOUNTER — Other Ambulatory Visit: Payer: Self-pay | Admitting: Neurology

## 2015-12-12 ENCOUNTER — Ambulatory Visit (INDEPENDENT_AMBULATORY_CARE_PROVIDER_SITE_OTHER): Payer: Medicare Other | Admitting: Family Medicine

## 2015-12-12 VITALS — BP 118/76 | HR 57

## 2015-12-12 DIAGNOSIS — D485 Neoplasm of uncertain behavior of skin: Secondary | ICD-10-CM | POA: Diagnosis not present

## 2015-12-12 DIAGNOSIS — D510 Vitamin B12 deficiency anemia due to intrinsic factor deficiency: Secondary | ICD-10-CM

## 2015-12-12 DIAGNOSIS — L814 Other melanin hyperpigmentation: Secondary | ICD-10-CM | POA: Diagnosis not present

## 2015-12-12 DIAGNOSIS — D2339 Other benign neoplasm of skin of other parts of face: Secondary | ICD-10-CM | POA: Diagnosis not present

## 2015-12-12 MED ORDER — CYANOCOBALAMIN 1000 MCG/ML IJ SOLN
1000.0000 ug | Freq: Once | INTRAMUSCULAR | Status: AC
  Administered 2015-12-12: 1000 ug via INTRAMUSCULAR

## 2015-12-12 NOTE — Progress Notes (Signed)
   Subjective:    Patient ID: Mia [name], female    DOB: 17-Jan-1951, 65 y.o.   MRN: [id number]  HPI    Review of Systems     Objective:   Physical Exam        Assessment & Plan:  B12 injection given today  [name], MD

## 2015-12-16 ENCOUNTER — Inpatient Hospital Stay: Admit: 2015-12-16 | Payer: BLUE CROSS/BLUE SHIELD | Attending: Internal Medicine | Primary: Internal Medicine

## 2015-12-16 ENCOUNTER — Telehealth

## 2015-12-16 DIAGNOSIS — R928 Other abnormal and inconclusive findings on diagnostic imaging of breast: Secondary | ICD-10-CM

## 2015-12-16 DIAGNOSIS — N63 Unspecified lump in unspecified breast: Secondary | ICD-10-CM

## 2015-12-16 NOTE — Telephone Encounter (Signed)
Call from [facility], needs left breast ultrasound for new nodule in left breast, order placed

## 2015-12-30 ENCOUNTER — Telehealth: Payer: Self-pay | Admitting: *Deleted

## 2015-12-30 NOTE — Telephone Encounter (Signed)
Patient [place] form in [name] office.

## 2015-12-31 DIAGNOSIS — H35371 Puckering of macula, right eye: Secondary | ICD-10-CM | POA: Diagnosis not present

## 2015-12-31 DIAGNOSIS — H31009 Unspecified chorioretinal scars, unspecified eye: Secondary | ICD-10-CM | POA: Diagnosis not present

## 2015-12-31 DIAGNOSIS — H43812 Vitreous degeneration, left eye: Secondary | ICD-10-CM | POA: Diagnosis not present

## 2015-12-31 DIAGNOSIS — H33101 Unspecified retinoschisis, right eye: Secondary | ICD-10-CM | POA: Diagnosis not present

## 2015-12-31 DIAGNOSIS — E119 Type 2 diabetes mellitus without complications: Secondary | ICD-10-CM | POA: Diagnosis not present

## 2015-12-31 MED ORDER — CITALOPRAM 10 MG TAB
10 mg | ORAL_TABLET | ORAL | 3 refills | Status: DC
Start: 2015-12-31 — End: 2017-02-08

## 2016-01-01 ENCOUNTER — Encounter: Payer: Self-pay | Admitting: Sports Medicine

## 2016-01-01 ENCOUNTER — Ambulatory Visit (INDEPENDENT_AMBULATORY_CARE_PROVIDER_SITE_OTHER): Payer: Medicare Other | Admitting: Sports Medicine

## 2016-01-01 VITALS — BP 132/80 | HR 70 | Wt 160.5 lb

## 2016-01-01 DIAGNOSIS — M25561 Pain in right knee: Secondary | ICD-10-CM | POA: Diagnosis not present

## 2016-01-01 DIAGNOSIS — M47812 Spondylosis without myelopathy or radiculopathy, cervical region: Secondary | ICD-10-CM

## 2016-01-01 DIAGNOSIS — Z96651 Presence of right artificial knee joint: Secondary | ICD-10-CM

## 2016-01-01 DIAGNOSIS — M5382 Other specified dorsopathies, cervical region: Secondary | ICD-10-CM

## 2016-01-01 MED ORDER — METHOCARBAMOL 500 MG PO TABS: 500.0000 mg | ORAL_TABLET | Freq: Three times a day (TID) | ORAL | Status: DC

## 2016-01-01 NOTE — Assessment & Plan Note (Addendum)
Patellofemoral type pain with effusion, aspiration, cell counts and culture. Injection. She also had some Hoffa fat-pad type symptoms, we can approach this at the next visit if needed.

## 2016-01-01 NOTE — Assessment & Plan Note (Signed)
Good response to C4-C5 cervical facet joint injection. Partial relief of neck pain however she continues to have some radicular type pain. We are going to proceed with a C6-C7 cervical epidural on the left. Switching to Robaxin. Return to see me in one month.

## 2016-01-01 NOTE — Progress Notes (Signed)
  Subjective:    CC: Follow-up  HPI: Cervical spondylosis: Good response to left-sided C4-C5 facet injection with partial relief of symptoms, still having some left-sided C6 distribution radiculopathy. Moderate, persistent, radiation to the first and second fingers. No constitutional symptoms.  Right knee pain: Post total knee arthroplasty, increasing pain and swelling at the medial joint line.  Past medical history, Surgical history, Family history not pertinant except as noted below, Social history, Allergies, and medications have been entered into the medical record, reviewed, and no changes needed.   Review of Systems: No fevers, chills, night sweats, weight loss, chest pain, or shortness of breath.   Objective:    General: Well Developed, well nourished, and in no acute distress.  Neuro: Alert and oriented x3, extra-ocular muscles intact, sensation grossly intact.  HEENT: Normocephalic, atraumatic, pupils equal round reactive to light, neck supple, no masses, no lymphadenopathy, thyroid nonpalpable.  Skin: Warm and dry, no rashes. Cardiac: Regular rate and rhythm, no murmurs rubs or gallops, no lower extremity edema.  Respiratory: Clear to auscultation bilaterally. Not using accessory muscles, speaking in full sentences. Right Knee: Visible swelling with a palpable fluid wave, and tenderness at the anteromedial joint line ROM normal in flexion and extension and lower leg rotation. Ligaments with solid consistent endpoints including ACL, PCL, LCL, MCL. Negative Mcmurray's and provocative meniscal tests. Non painful patellar compression. Patellar and quadriceps tendons unremarkable. Hamstring and quadriceps strength is normal.  Procedure: Real-time Ultrasound Guided aspiration/Injection of right knee Device: GE Logiq E  Verbal informed consent obtained.  Time-out conducted.  Noted no overlying erythema, induration, or other signs of local infection.  Skin prepped in a sterile  fashion.  Local anesthesia: Topical Ethyl chloride.  With sterile technique and under real time ultrasound guidance:  Aspirated 20 mL straw-colored fluid, syringe switched and 1 mL kenalog 40, 2 mL lidocaine, 2 mL Marcaine injected easily. Completed without difficulty  Pain immediately resolved suggesting accurate placement of the medication.  Advised to call if fevers/chills, erythema, induration, drainage, or persistent bleeding.  Images permanently stored and available for review in the ultrasound unit.  Impression: Technically successful ultrasound guided injection.  Impression and Recommendations:

## 2016-01-02 ENCOUNTER — Ambulatory Visit (INDEPENDENT_AMBULATORY_CARE_PROVIDER_SITE_OTHER): Payer: Medicare Other | Admitting: Emergency Medicine

## 2016-01-02 ENCOUNTER — Encounter: Payer: Self-pay | Admitting: Emergency Medicine

## 2016-01-02 VITALS — BP 126/90 | HR 70 | Ht 66.0 in | Wt 163.0 lb

## 2016-01-02 DIAGNOSIS — G4733 Obstructive sleep apnea (adult) (pediatric): Secondary | ICD-10-CM | POA: Diagnosis not present

## 2016-01-02 NOTE — Assessment & Plan Note (Signed)
She has not been compliant with her CPAP reliably based on her download but she also notes that her social situation, her medical conditions, hospitalizations etc. A bit huge barrier to good compliance. She now appeared to start to wear the mask more reliably. I like to reassess her compliance in 3 months so that we can then report this back to [facility] and allow her to get her supplies.

## 2016-01-02 NOTE — Progress Notes (Signed)
History of Present Illness:  65 yo woman never smoker with hx paroxismal SVT, Holter showed correlation at the time with exertional dyspnea and lightheadedness. PFT, methacholine and CPEX were reassuring except exercise limited by tachycardia. Has undergone CPAP titration and needs 14cm H2O pressure   ROV 07/30/09 -- Not using nasal pillows, using the nasal mask. Has been switched to auto-titration device due to difficulty tolerating 14cm H2O. Has had better compliance with latest mask. Wears it every night, When she wakes up in the middle of the night she takes it off. Can't really tell any difference in how she feels during the day - does have less fibromyalgia pain since she's been using it.   January 10, 2010--Presents for acute office visit. Complains of occ pain in left upper back wrapping around to the left breast when she takes a deep breath x8weeks. Happens intermittently, sore to touch, pain is under shoulder blade w/ radiation around to rbs. Use muscle relaxer patch did not help, no other meds used. Denies chest pain, dyspnea, orthopnea, hemoptysis, fever, n/v/d, edema, headache,recent travel or antibiotics.   ROV 08/25/10 -- Hx OSA and HTN. Was started on diltiazem about 6 weeks ago. Began to develop B LE edema, L>R, about 2 weeks ago. US showed no DVT, CT scan chest done, no PE showed "? PNA" so treated with levaquin. She had absolutely no symptoms at that time. She has been having some exertional dyspnea, occasionally feels her tachycardia, not sure the two correlate. Tells me she hasn't worn her CPAP in about 4 months because her mask is hurting her face.   ROV 09/04/10 -- Returns to f/u her edema, exertional SOB. I performed 24h urine, TTE last time - both are reassuring. She has not been wearing her CPAP mask reliably, needs a new mask.   ROV 02/08/13 -- return visit with hx OSA (on CPAP), HTN, SVT, exertional dyspnea s/p extensive w/u (negative methacholine, reassuring CPST. She was seen  by Dr Acie Fredrickson 01/24/13 for similar sx, tachycardia. Underwent TTE and stress testing >> normal. Her HR and BP were high. She has been off of CPAP for two years. She started back 2 weeks ago. Just got a new full face mask > having leakage problem. [employer] is the company. She says she is on auto-set, not 14cm H2O.   ROV 06/25/14 -- follows up for chronic dyspnea (w/u as above), OSA. She has been having trouble with R knee pain, R great toe pain. She has has hx pseudogout. She has been wearing her CPAP very rarely. She has the most difficulty trying to go to sleep, often gets waked up by a leak. She had spirometry 06/20/14 >> normal AF's, ? Small airways responsiveness. Her methacholine has previously been negative.    ROV 09/18/14 -- follow up visit for OSA and dyspnea with reassuring spirometry. 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 She has gotten a new CPAP and new mask > has been wearing reliably. She was seen in cardiology 11/13 for continued dyspnea, was noted to have exertional tachycardia. He is planning to repeat a stress test in the future. She tells me that she has been wearing her C Pap reliably averaging about 6 hours a night. She feels that it is helped her and is having less daytime sleepiness. A recent download from her device from 10/24-11/22/15 showed that she used her device for greater than 4 hours 77% of the time.   ROV 3//9/17 -- follow-up visit for obstructive sleep apnea. She  has a CPAP download available between December 7 and March 6. This shows usage on 14% of days only.  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 She has been sleeping on couch, had a huge change in her sleep habits in the time reflected in the download above. She tells me also that she has had 2 syncopal episodes in November '16. Seen by neuro - no evidence seizures. Cardiology performed rhythm monitor that was reassuring.    Filed Vitals:   01/02/16  1140  BP: 126/90  Pulse: 70  Height: 5\' 6"  (1.676 m)  Weight: 163 lb (73.936 kg)  SpO2: 100%   Gen: Pleasant, well-nourished, in no distress,  normal affect  ENT: No lesions,  mouth clear,  oropharynx clear, no postnasal drip  Neck: No JVD, no TMG, no carotid bruits  Lungs: No use of accessory muscles, no dullness to percussion, clear without rales or rhonchi  Cardiovascular: RRR, heart sounds normal, no murmur or gallops, no peripheral edema  Musculoskeletal: No deformities, no cyanosis or clubbing  Neuro: alert, non focal  Skin: Warm, no lesions or rashes    Cardiac Stress Test 02/01/13: Impression  Exercise Capacity: Lexiscan with low level exercise.  BP Response: Normal blood pressure response.  Clinical Symptoms: There is dyspnea.  ECG Impression: No significant ST segment change suggestive of ischemia.  Comparison with Prior Nuclear Study: No significant change from previous study  Overall Impression: Normal stress nuclear study. No evidence of ischemia. Heart rate accelerated very rapidly on walking lexiscan protocol.  LV Ejection Fraction: 59%. LV Wall Motion: NL LV Function; NL Wall Motion  OSA (obstructive sleep apnea) She has not been compliant with her CPAP reliably based on her download but she also notes that her social situation, her medical conditions, hospitalizations etc. A bit huge barrier to good compliance. She now appeared to start to wear the mask more reliably. I like to reassess her compliance in 3 months so that we can then report this back to [facility] and allow her to get her supplies.

## 2016-01-02 NOTE — Patient Instructions (Addendum)
Restart using your CPAP every night using your chin strap.  Please follow with Dr Lamonte Sakai in 3 months with a data download from your device so that we can report back to [facility] your pattern of usage.

## 2016-01-03 ENCOUNTER — Telehealth: Payer: Self-pay | Admitting: *Deleted

## 2016-01-03 NOTE — Telephone Encounter (Signed)
Initiated PA for [employer] through [employer]

## 2016-01-03 NOTE — Telephone Encounter (Signed)
Methocarbamol was denied through patients insurance because dx of cervical facet joint syndrome or fibromyalgia is not supported by FDA. Denial letter placed in providers box

## 2016-01-06 ENCOUNTER — Telehealth: Payer: Self-pay | Admitting: *Deleted

## 2016-01-06 ENCOUNTER — Other Ambulatory Visit: Payer: Self-pay | Admitting: Family Medicine

## 2016-01-06 MED ORDER — GLUCOSE BLOOD VI STRP: ORAL_STRIP | Status: DC

## 2016-01-06 NOTE — Telephone Encounter (Signed)
Patient DMV form ready at the front desk for pick up.

## 2016-01-07 DIAGNOSIS — Z0289 Encounter for other administrative examinations: Secondary | ICD-10-CM

## 2016-01-07 DIAGNOSIS — Z79899 Other long term (current) drug therapy: Secondary | ICD-10-CM | POA: Diagnosis not present

## 2016-01-09 ENCOUNTER — Ambulatory Visit: Payer: Medicare Other

## 2016-01-10 ENCOUNTER — Ambulatory Visit (INDEPENDENT_AMBULATORY_CARE_PROVIDER_SITE_OTHER): Payer: Medicare Other | Admitting: Family Medicine

## 2016-01-10 ENCOUNTER — Encounter: Payer: Self-pay | Admitting: Family Medicine

## 2016-01-10 ENCOUNTER — Ambulatory Visit (INDEPENDENT_AMBULATORY_CARE_PROVIDER_SITE_OTHER): Payer: Medicare Other | Admitting: Sports Medicine

## 2016-01-10 VITALS — BP 127/75 | HR 86 | Wt 162.0 lb

## 2016-01-10 VITALS — BP 127/75 | HR 86 | Temp 97.6°F | Wt 162.0 lb

## 2016-01-10 DIAGNOSIS — M25561 Pain in right knee: Secondary | ICD-10-CM

## 2016-01-10 DIAGNOSIS — Z96651 Presence of right artificial knee joint: Secondary | ICD-10-CM

## 2016-01-10 DIAGNOSIS — E538 Deficiency of other specified B group vitamins: Secondary | ICD-10-CM

## 2016-01-10 MED ORDER — CYANOCOBALAMIN 1000 MCG/ML IJ SOLN
1000.0000 ug | Freq: Once | INTRAMUSCULAR | Status: AC
  Administered 2016-01-10: 1000 ug via INTRAMUSCULAR

## 2016-01-10 NOTE — Progress Notes (Signed)
Patient came into clinic today for monthly B12 injection. Pt tolerated injection in [place] well, no immediate complications. Pt also was to discuss POC with Dr. Dianah Field regarding her knee pain. Pt was added onto his schedule for this afternoon. Pt scheduled her next B12 injection visit prior to leaving.

## 2016-01-10 NOTE — Addendum Note (Signed)
Addended by: Elizabeth Sauer on: 01/10/2016 04:19 PM   Modules accepted: Medications

## 2016-01-10 NOTE — Assessment & Plan Note (Addendum)
Improved significantly after aspiration and injection at the last visit, the lab did lose the arthrocentesis sample. No fevers or chills. Now has some pain at the pes anserine bursa, this was injected today without guidance. Return in one month, if persistent pain she will need to go back to the orthopedic surgeon for further evaluation.  I have a very low suspicion for infection and the prosthesis, I do think her night sweats are likely related to menopause however if persistent At the follow-up visit we will check a sedimentation rate.

## 2016-01-10 NOTE — Progress Notes (Signed)
Agree with below. Addendum:    I believe that she has limits with moving and including, toileting, bathing, feeding, dressing and grooming. I believe the power wheelchair is needed for pt to be able to perform ADL's in her home

## 2016-01-10 NOTE — Progress Notes (Signed)
  Subjective:    CC:  Follow-up  HPI: Right knee pain: Good response temporarily to aspiration and injection at the last visit, unfortunately has started to have some pain that she localizes just distal to the anteromedial joint line. Moderate, persistent without radiation, no constitutional symptoms with the exception of occasional night sweats which she attributes to  Being postmenopausal.  Past medical history, Surgical history, Family history not pertinant except as noted below, Social history, Allergies, and medications have been entered into the medical record, reviewed, and no changes needed.   Review of Systems: No fevers, chills, night sweats, weight loss, chest pain, or shortness of breath.   Objective:    General: Well Developed, well nourished, and in no acute distress.  Neuro: Alert and oriented x3, extra-ocular muscles intact, sensation grossly intact.  HEENT: Normocephalic, atraumatic, pupils equal round reactive to light, neck supple, no masses, no lymphadenopathy, thyroid nonpalpable.  Skin: Warm and dry, no rashes. Cardiac: Regular rate and rhythm, no murmurs rubs or gallops, no lower extremity edema.  Respiratory: Clear to auscultation bilaterally. Not using accessory muscles, speaking in full sentences. Right Knee: Normal to inspection with no erythema or effusion or obvious bony abnormalities. Tender to palpation over the pes anserine bursa. ROM normal in flexion and extension and lower leg rotation. Ligaments with solid consistent endpoints including ACL, PCL, LCL, MCL. Negative Mcmurray's and provocative meniscal tests. Non painful patellar compression. Patellar and quadriceps tendons unremarkable. Hamstring and quadriceps strength is normal.  Procedure:  Injection of Right pes bursa Consent obtained and verified. Time-out conducted. Noted no overlying erythema, induration, or other signs of local infection. Skin prepped in a sterile fashion. Topical analgesic  spray: Ethyl chloride. Completed without difficulty. Meds: noted spot of maximal tenderness, 25-gauge needle advanced and 1 mL kenalog 40, 1 mL lidocaine, 1 mL Marcaine injected in a fanlike pattern. Pain immediately improved suggesting accurate placement of the medication. Advised to call if fevers/chills, erythema, induration, drainage, or persistent bleeding.  Impression and Recommendations:

## 2016-01-13 ENCOUNTER — Other Ambulatory Visit: Payer: Medicare Other

## 2016-01-14 ENCOUNTER — Ambulatory Visit
Admission: RE | Admit: 2016-01-14 | Discharge: 2016-01-14 | Disposition: A | Discharge status: Home / Self Care | Payer: Medicare Other | Source: Ambulatory Visit | Attending: Sports Medicine | Admitting: Sports Medicine

## 2016-01-14 DIAGNOSIS — M542 Cervicalgia: Secondary | ICD-10-CM | POA: Diagnosis not present

## 2016-01-14 MED ORDER — IOHEXOL 300 MG/ML  SOLN
1.0000 mL | Freq: Once | INTRAMUSCULAR | Status: AC | PRN
  Administered 2016-01-14: 1 mL via EPIDURAL

## 2016-01-14 MED ORDER — TRIAMCINOLONE ACETONIDE 40 MG/ML IJ SUSP (RADIOLOGY)
60.0000 mg | Freq: Once | INTRAMUSCULAR | Status: AC
Start: 2016-01-14 — End: 2016-01-14
  Administered 2016-01-14: 60 mg via EPIDURAL

## 2016-01-14 NOTE — Discharge Instructions (Signed)

## 2016-01-23 ENCOUNTER — Ambulatory Visit (INDEPENDENT_AMBULATORY_CARE_PROVIDER_SITE_OTHER): Payer: Medicare Other | Admitting: Family Medicine

## 2016-01-23 ENCOUNTER — Encounter: Payer: Self-pay | Admitting: Family Medicine

## 2016-01-23 VITALS — BP 118/56 | HR 83 | Wt 167.0 lb

## 2016-01-23 DIAGNOSIS — R0789 Other chest pain: Secondary | ICD-10-CM

## 2016-01-23 DIAGNOSIS — J309 Allergic rhinitis, unspecified: Secondary | ICD-10-CM

## 2016-01-23 DIAGNOSIS — N951 Menopausal and female climacteric states: Secondary | ICD-10-CM

## 2016-01-23 DIAGNOSIS — K2100 Gastro-esophageal reflux disease with esophagitis, without bleeding: Secondary | ICD-10-CM

## 2016-01-23 DIAGNOSIS — R232 Flushing: Secondary | ICD-10-CM

## 2016-01-23 DIAGNOSIS — Z1329 Encounter for screening for other suspected endocrine disorder: Secondary | ICD-10-CM | POA: Diagnosis not present

## 2016-01-23 DIAGNOSIS — E538 Deficiency of other specified B group vitamins: Secondary | ICD-10-CM | POA: Diagnosis not present

## 2016-01-23 NOTE — Progress Notes (Signed)
   Subjective:    Patient ID: Mia [name], female    DOB: Apr 18, 1951, 65 y.o.   MRN: [id number]  HPI Patient comes in today but complaining of intermittent hot flashes that occur almost daily and sometimes more than once a day. She thinks it started around 2 months ago. She denies any fevers chills or recent illnesses. Says she will sweat to the point that her hair gets soaking wet. It usually just lasts for a few minutes and then resolves. She has not been able to trace in time type of pattern. There is no certain time of day it can happen. He can be at rest or with activity. The only medication that's new is her gabapentin. All the other medication she has been taking for years. She had a complete hysterectomy in her 55s.   Having burning that starts in her chest and radiates up to her neck.  She takes her pantoprazole regularly.  She says this started a couple months ago as well but is more intermittent, maybe once or twice per week.  She's also had some cough but feels like it's related to spring allergies.  Review of Systems     Objective:   Physical Exam  Constitutional: She is oriented to person, place, and time. She appears well-developed and well-nourished.  HENT:  Head: Normocephalic and atraumatic.  Right Ear: External ear normal.  Left Ear: External ear normal.  Nose: Nose normal.  Mouth/Throat: Oropharynx is clear and moist.  TMs and canals are clear.   Eyes: Conjunctivae and EOM are normal. Pupils are equal, round, and reactive to light.  Neck: Neck supple. No thyromegaly present.  Cardiovascular: Normal rate, regular rhythm and normal heart sounds.   Pulmonary/Chest: Effort normal and breath sounds normal. She has no wheezes.  Lymphadenopathy:    She has no cervical adenopathy.  Neurological: She is alert and oriented to person, place, and time.  Skin: Skin is warm and dry.  Psychiatric: She has a normal mood and affect.          Assessment & Plan:  Hot  flashes/sweats-clear etiology. Will be very unlikely to be hormonal since she went through menopause in her 54s. I did see if it was a significant side effect for gabapentin since that is the only new medication that she is taking and does not. Certainly we can check her thyroid and check her for anemia and iron deficiency anemia as well as an underlying infection.  A typical chest pain-could be GERD since she gets a burning sensation up to her neck that she is Artie on a PPI twice a days of this is a little unusual and she says this feels a little different than her typical reflux. Will do an EKG today for further evaluation as well as check a troponin and CK. Consider referral back to GI for breakthrough symptoms. EKG today shows rate of 74 bpm, normal sinus rhythm with normal axis and no acute ST-T wave changes.  AR- OK to use an over-the-counter antihistamine or nasal steroid spray as needed.

## 2016-01-24 LAB — CBC WITH DIFFERENTIAL/PLATELET
Basophils Absolute: 0.1 10*3/uL (ref 0.0–0.1)
Basophils Relative: 1 % (ref 0–1)
Eosinophils Absolute: 0.2 10*3/uL (ref 0.0–0.7)
Eosinophils Relative: 3 % (ref 0–5)
HCT: 40.4 % (ref 36.0–46.0)
Hemoglobin: 13.2 g/dL (ref 12.0–15.0)
Lymphocytes Relative: 18 % (ref 12–46)
Lymphs Abs: 1.1 10*3/uL (ref 0.7–4.0)
MCH: 30.3 pg (ref 26.0–34.0)
MCHC: 32.7 g/dL (ref 30.0–36.0)
MCV: 92.7 fL (ref 78.0–100.0)
MPV: 10.1 fL (ref 8.6–12.4)
Monocytes Absolute: 0.6 10*3/uL (ref 0.1–1.0)
Monocytes Relative: 11 % (ref 3–12)
Neutro Abs: 4 10*3/uL (ref 1.7–7.7)
Neutrophils Relative %: 67 % (ref 43–77)
Platelets: 308 10*3/uL (ref 150–400)
RBC: 4.36 MIL/uL (ref 3.87–5.11)
RDW: 14.6 % (ref 11.5–15.5)
WBC: 5.9 10*3/uL (ref 4.0–10.5)

## 2016-01-24 LAB — LUTEINIZING HORMONE: LH: 23.7 m[IU]/mL

## 2016-01-24 LAB — PROGESTERONE: Progesterone: <0.5 ng/mL

## 2016-01-24 LAB — TROPONIN I: Troponin I: <0.01 ng/mL (ref <=0.05)

## 2016-01-24 LAB — CK: Total CK: 133 U/L (ref 7–177)

## 2016-01-24 LAB — FOLLICLE STIMULATING HORMONE: FSH: 69.5 m[IU]/mL

## 2016-01-24 LAB — TSH: TSH: 0.62 mIU/L

## 2016-01-24 LAB — FERRITIN: Ferritin: 71 ng/mL (ref 20–288)

## 2016-01-24 LAB — ESTRADIOL: Estradiol: 17 pg/mL

## 2016-01-27 ENCOUNTER — Other Ambulatory Visit: Payer: Self-pay | Admitting: *Deleted

## 2016-01-27 NOTE — Telephone Encounter (Signed)
We can do hydrocodone cough syrup.  Ok to pick up Rx when he is able.

## 2016-01-27 NOTE — Telephone Encounter (Signed)
Pt called and stated that she has a bad cough and tried an OTC cough med which didn't seem to help and wanted to know if something else could be given to her. Will fwd to pcp for advice.Audelia Hives [place]

## 2016-01-28 ENCOUNTER — Encounter: Payer: Self-pay | Admitting: Physician Assistant

## 2016-01-28 ENCOUNTER — Ambulatory Visit (INDEPENDENT_AMBULATORY_CARE_PROVIDER_SITE_OTHER): Payer: Medicare Other | Admitting: Physician Assistant

## 2016-01-28 VITALS — BP 133/90 | HR 78 | Temp 99.2°F | Wt 170.0 lb

## 2016-01-28 DIAGNOSIS — J45901 Unspecified asthma with (acute) exacerbation: Secondary | ICD-10-CM | POA: Diagnosis not present

## 2016-01-28 MED ORDER — ALBUTEROL SULFATE HFA 108 (90 BASE) MCG/ACT IN AERS: 2.0000 | INHALATION_SPRAY | Freq: Four times a day (QID) | RESPIRATORY_TRACT | Status: DC | PRN

## 2016-01-28 MED ORDER — HYDROCODONE-HOMATROPINE 5-1.5 MG/5ML PO SYRP: 5.0000 mL | ORAL_SOLUTION | Freq: Every evening | ORAL | Status: DC | PRN

## 2016-01-28 MED ORDER — METHYLPREDNISOLONE ACETATE 40 MG/ML IJ SUSP
40.0000 mg | Freq: Once | INTRAMUSCULAR | Status: AC
  Administered 2016-01-28: 40 mg via INTRAMUSCULAR

## 2016-01-28 MED ORDER — AZITHROMYCIN 250 MG PO TABS: ORAL_TABLET | ORAL | Status: DC

## 2016-01-28 MED ORDER — IPRATROPIUM-ALBUTEROL 0.5-2.5 (3) MG/3ML IN SOLN
3.0000 mL | Freq: Once | RESPIRATORY_TRACT | Status: AC
  Administered 2016-01-28: 3 mL via RESPIRATORY_TRACT

## 2016-01-28 NOTE — Progress Notes (Signed)
   Subjective:    Patient ID: Mia [name], female    DOB: 1951/07/10, 65 y.o.   MRN: [id number]  HPI Patient is a 65 year old female who presents to the clinic with her husband with cough for the last 10 days. She's been trying to Dr. herself with over-the-counter medications. She's been taking Mucinex and Delsym. Her cough is continued to get worse. She now complains of chest tightness and wheezing. She coughs so much that her lungs feel tight. She denies a history of asthma. She has been running a low-grade fever of around 100. She denies any sinus pressure, ear pain or sore throat.   Review of Systems See HPI.    Objective:   Physical Exam  Constitutional: She is oriented to person, place, and time. She appears well-developed and well-nourished.  HENT:  Head: Normocephalic and atraumatic.  Right Ear: External ear normal.  Left Ear: External ear normal.  Nose: Nose normal.  Mouth/Throat: Oropharynx is clear and moist. No oropharyngeal exudate.  Eyes: Conjunctivae are normal. Right eye exhibits no discharge. Left eye exhibits no discharge.  Neck: Normal range of motion. Neck supple.  Cardiovascular: Normal rate, regular rhythm and normal heart sounds.   Pulmonary/Chest:  Wheezing bilateral lungs with rhonchi. More with expiration. No crackles.  Pulse ox 100 percent.  Lymphadenopathy:    She has no cervical adenopathy.  Neurological: She is alert and oriented to person, place, and time.  Psychiatric: She has a normal mood and affect. Her behavior is normal.          Assessment & Plan:  Asthmatic bronchitis- duoneb given in office with minimal symptoms relief. Albuterol inhaler given to use as needed and before bed. Delsym for cough. Consider warm honey and cough drops. Depo medrol 40mg  IM given in office today. zpak for 5 days. Follow up as needed.

## 2016-01-28 NOTE — Patient Instructions (Signed)

## 2016-01-29 ENCOUNTER — Ambulatory Visit: Payer: Medicare Other | Admitting: Sports Medicine

## 2016-02-10 ENCOUNTER — Ambulatory Visit: Payer: Medicare Other

## 2016-02-10 ENCOUNTER — Ambulatory Visit (INDEPENDENT_AMBULATORY_CARE_PROVIDER_SITE_OTHER): Payer: Medicare Other

## 2016-02-10 ENCOUNTER — Ambulatory Visit (INDEPENDENT_AMBULATORY_CARE_PROVIDER_SITE_OTHER): Payer: Medicare Other | Admitting: Sports Medicine

## 2016-02-10 ENCOUNTER — Other Ambulatory Visit: Payer: Self-pay | Admitting: Sports Medicine

## 2016-02-10 ENCOUNTER — Encounter: Payer: Self-pay | Admitting: Sports Medicine

## 2016-02-10 VITALS — BP 145/80 | HR 61 | Resp 18 | Wt 171.4 lb

## 2016-02-10 DIAGNOSIS — S60551S Superficial foreign body of right hand, sequela: Secondary | ICD-10-CM | POA: Diagnosis not present

## 2016-02-10 DIAGNOSIS — M5382 Other specified dorsopathies, cervical region: Secondary | ICD-10-CM

## 2016-02-10 DIAGNOSIS — M25541 Pain in joints of right hand: Secondary | ICD-10-CM

## 2016-02-10 DIAGNOSIS — Z96651 Presence of right artificial knee joint: Secondary | ICD-10-CM | POA: Diagnosis not present

## 2016-02-10 DIAGNOSIS — E538 Deficiency of other specified B group vitamins: Secondary | ICD-10-CM | POA: Diagnosis not present

## 2016-02-10 DIAGNOSIS — S60551A Superficial foreign body of right hand, initial encounter: Secondary | ICD-10-CM | POA: Insufficient documentation

## 2016-02-10 DIAGNOSIS — G250 Essential tremor: Secondary | ICD-10-CM

## 2016-02-10 DIAGNOSIS — M795 Residual foreign body in soft tissue: Secondary | ICD-10-CM | POA: Diagnosis not present

## 2016-02-10 DIAGNOSIS — M47812 Spondylosis without myelopathy or radiculopathy, cervical region: Secondary | ICD-10-CM

## 2016-02-10 MED ORDER — CYANOCOBALAMIN 1000 MCG/ML IJ SOLN
1000.0000 ug | Freq: Once | INTRAMUSCULAR | Status: AC
  Administered 2016-02-10: 1000 ug via INTRAMUSCULAR

## 2016-02-10 MED ORDER — PRIMIDONE 50 MG PO TABS: 50.0000 mg | ORAL_TABLET | Freq: Every day | ORAL | Status: DC

## 2016-02-10 NOTE — Assessment & Plan Note (Signed)
Adding primidone in the hopes of controlling her neck pain and essential tremor

## 2016-02-10 NOTE — Assessment & Plan Note (Signed)
X-rays, return for surgical excision

## 2016-02-10 NOTE — Progress Notes (Signed)
   Subjective:    I'm seeing this patient as a consultation for:  Dr. [name]  CC: right hand foreign body  HPI: This is a pleasant 65 year old female, for the past several months she's had a piece of glass in her hand, right sided, at the hypothenar eminence, she removed part of it but has persistent pain and swelling.  Right knee arthroplasty: Persistent pain at the medial joint line, moderate, persistent, has failed pes anserine injection as well as an intra-articular injection.  Cervical spondylosis:  did well after cervical facet injection, had partial relief of symptoms so we proceeded with a cervical epidural that provided additional relief of symptoms, at this point has good relief, maybe 60%, and is not desiring any surgical intervention now.  Essential tremor: Wonders if this may be worsening her neck pain, has never been treated.  Past medical history, Surgical history, Family history not pertinant except as noted below, Social history, Allergies, and medications have been entered into the medical record, reviewed, and no changes needed.   Review of Systems: No headache, visual changes, nausea, vomiting, diarrhea, constipation, dizziness, abdominal pain, skin rash, fevers, chills, night sweats, weight loss, swollen lymph nodes, body aches, joint swelling, muscle aches, chest pain, shortness of breath, mood changes, visual or auditory hallucinations.   Objective:   General: Well Developed, well nourished, and in no acute distress.  Neuro/Psych: Alert and oriented x3, extra-ocular muscles intact, able to move all 4 extremities, sensation grossly intact. Skin: Warm and dry, no rashes noted.  Respiratory: Not using accessory muscles, speaking in full sentences, trachea midline.  Cardiovascular: Pulses palpable, no extremity edema. Abdomen: Does not appear distended. Right hand: There is swelling, and a small papule that is slightly tender without erythema or warmth over  the hypothenar eminence. Good motion and good strength of the fifth finger.  X-rays are negative.  Impression and Recommendations:   This case required medical decision making of moderate complexity.

## 2016-02-10 NOTE — Assessment & Plan Note (Signed)
Good response to C4-C5 cervical facet injection, improvement again with a C6-C7 left-sided cervical epidural, minimal persistent pain.  She does have significant essential tremor, we are going to treat this in the hopes of improving her neck pain. She would be a candidate for a repeat left C6-C7 interlaminar epidural should she desire.

## 2016-02-10 NOTE — Assessment & Plan Note (Signed)
Now with aspirations and injections of the joint as well as the pes bursa. Persistent pain, x-rays have been unrevealing, we are going to proceed with bone scan to evaluate for loosening of the arthroplasty prosthesis.

## 2016-02-13 ENCOUNTER — Ambulatory Visit (INDEPENDENT_AMBULATORY_CARE_PROVIDER_SITE_OTHER): Payer: Medicare Other | Admitting: Sports Medicine

## 2016-02-13 VITALS — BP 130/79 | HR 78 | Resp 18 | Wt 170.1 lb

## 2016-02-13 DIAGNOSIS — S60551S Superficial foreign body of right hand, sequela: Secondary | ICD-10-CM | POA: Diagnosis not present

## 2016-02-13 DIAGNOSIS — S60559A Superficial foreign body of unspecified hand, initial encounter: Secondary | ICD-10-CM | POA: Diagnosis not present

## 2016-02-13 NOTE — Assessment & Plan Note (Signed)
Excision of hypo-thenar eminence foreign body. Sutures placed, return to see me in one week for suture removal.

## 2016-02-13 NOTE — Progress Notes (Signed)
  Procedure:  Removal of right hand foreign body. Risks, benefits, alternatives explained to patient. Consent obtained. Time out conducted. Noted no overlying induration or erythema at site of injection. A small amount of lidocaine infiltrated under and around the foreign body for local anesthesia. Using both sharp and blunt dissection, the foreign body was successfully removed. I then placed #4, 4-0 simple interrupted Prolene sutures to approximate the edges of the wound. Wound dressed. Advised to return if increased redness, swelling, drainage, fevers, or chills.

## 2016-02-13 NOTE — Addendum Note (Signed)
Addended by: Elizabeth Sauer on: 02/13/2016 01:16 PM   Modules accepted: Orders

## 2016-02-14 ENCOUNTER — Encounter (HOSPITAL_COMMUNITY)
Admission: RE | Admit: 2016-02-14 | Discharge: 2016-02-14 | Disposition: A | Discharge status: Home / Self Care | Payer: Medicare Other | Source: Ambulatory Visit | Attending: Sports Medicine | Admitting: Sports Medicine

## 2016-02-14 DIAGNOSIS — Z96651 Presence of right artificial knee joint: Secondary | ICD-10-CM | POA: Diagnosis present

## 2016-02-14 DIAGNOSIS — M25561 Pain in right knee: Secondary | ICD-10-CM | POA: Diagnosis not present

## 2016-02-14 MED ORDER — TECHNETIUM TC 99M MEDRONATE IV KIT
25.0000 | PACK | Freq: Once | INTRAVENOUS | Status: AC | PRN
  Administered 2016-02-14: 25 via INTRAVENOUS

## 2016-02-20 ENCOUNTER — Ambulatory Visit (INDEPENDENT_AMBULATORY_CARE_PROVIDER_SITE_OTHER): Payer: Medicare Other | Admitting: Sports Medicine

## 2016-02-20 VITALS — BP 128/80 | HR 79 | Wt 179.0 lb

## 2016-02-20 DIAGNOSIS — S60551S Superficial foreign body of right hand, sequela: Secondary | ICD-10-CM

## 2016-02-20 NOTE — Progress Notes (Signed)
  Subjective: 7 days post foreign body removal   Objective: General: Well-developed, well-nourished, and in no acute distress. Left hand: Wound is clean, dry, intact, simple interrupted sutures were removed and a bit of Dermabond was placed across the wound to improve approximation of the edges.  Assessment/plan:

## 2016-02-20 NOTE — Assessment & Plan Note (Signed)
Sutures removed, doing well, pathology showed a small splinter surrounded by granuloma.

## 2016-02-24 MED ORDER — ATORVASTATIN 40 MG TAB
40 mg | ORAL_TABLET | ORAL | 3 refills | Status: DC
Start: 2016-02-24 — End: 2017-02-25

## 2016-03-11 ENCOUNTER — Other Ambulatory Visit: Payer: Self-pay

## 2016-03-11 ENCOUNTER — Ambulatory Visit (INDEPENDENT_AMBULATORY_CARE_PROVIDER_SITE_OTHER): Payer: Medicare Other | Admitting: Family Medicine

## 2016-03-11 VITALS — BP 106/69 | HR 99 | Wt 176.0 lb

## 2016-03-11 DIAGNOSIS — G250 Essential tremor: Secondary | ICD-10-CM

## 2016-03-11 DIAGNOSIS — E538 Deficiency of other specified B group vitamins: Secondary | ICD-10-CM

## 2016-03-11 DIAGNOSIS — Z79899 Other long term (current) drug therapy: Secondary | ICD-10-CM | POA: Diagnosis not present

## 2016-03-11 MED ORDER — PRIMIDONE 50 MG PO TABS: 50.0000 mg | ORAL_TABLET | Freq: Every day | ORAL | Status: DC

## 2016-03-11 MED ORDER — CYANOCOBALAMIN 1000 MCG/ML IJ SOLN
1000.0000 ug | Freq: Once | INTRAMUSCULAR | Status: AC
  Administered 2016-03-11: 1000 ug via INTRAMUSCULAR

## 2016-03-11 NOTE — Progress Notes (Signed)
Patient came into clinic today for monthly B12 injection. Pt reports no adverse side effects from injection. Denies any chest pain, dizziness, SOB. Pt tolerated injection well in [place] with no immediate complications. Pt does report she is able to drive again and she is very excited about that. No further questions/concerns. Pt advised to schedule her monthly injection.

## 2016-03-11 NOTE — Progress Notes (Signed)
Agree with above.  Catherine Metheney, MD  

## 2016-03-16 ENCOUNTER — Other Ambulatory Visit: Payer: Self-pay | Admitting: Family Medicine

## 2016-03-22 ENCOUNTER — Encounter: Payer: Self-pay | Admitting: Emergency Medicine

## 2016-03-22 ENCOUNTER — Emergency Department (INDEPENDENT_AMBULATORY_CARE_PROVIDER_SITE_OTHER)
Admission: EM | Admit: 2016-03-22 | Discharge: 2016-03-22 | Disposition: A | Discharge status: Home / Self Care | Payer: Medicare Other | Source: Home / Self Care | Attending: Family Medicine | Admitting: Family Medicine

## 2016-03-22 ENCOUNTER — Emergency Department (INDEPENDENT_AMBULATORY_CARE_PROVIDER_SITE_OTHER): Payer: Medicare Other

## 2016-03-22 DIAGNOSIS — M25474 Effusion, right foot: Secondary | ICD-10-CM | POA: Diagnosis not present

## 2016-03-22 DIAGNOSIS — S93601A Unspecified sprain of right foot, initial encounter: Secondary | ICD-10-CM | POA: Diagnosis not present

## 2016-03-22 DIAGNOSIS — M79671 Pain in right foot: Secondary | ICD-10-CM | POA: Diagnosis not present

## 2016-03-22 DIAGNOSIS — M7989 Other specified soft tissue disorders: Secondary | ICD-10-CM | POA: Diagnosis not present

## 2016-03-22 NOTE — ED Provider Notes (Signed)
CSN: [id number]     Arrival date & time 03/22/16  1648 History   First MD Initiated Contact with Patient 03/22/16 1712     Chief Complaint  Patient presents with  . Foot Pain      HPI Comments: While stepping out of car yesterday morning, patient had sudden sharp pain across the top of her right foot.  Patient is a 65 y.o. female presenting with lower extremity pain. The history is provided by the patient.  Foot Pain This is a new problem. The current episode started yesterday. The problem occurs constantly. The problem has been gradually worsening. The symptoms are aggravated by walking. Nothing relieves the symptoms. Treatments tried: Aleve. The treatment provided no relief.    Past Medical History  Diagnosis Date  . SVT (supraventricular tachycardia) ([place])   . Sleep apnea     a. on cpap.  . Raynaud phenomenon   . Anemia   . Arthritis   . Blood transfusion   . Diabetes mellitus   . GERD (gastroesophageal reflux disease)     gastritis  . Hypertension   . Neuromuscular disorder ([place])     sclerosis  . Pseudogout   . Thyroid disease     hypothyroidism  . Hemorrhoids   . Rectal bleeding   . PONV (postoperative nausea and vomiting)   . Memory difficulty 12/14/2013  . Fibromyalgia   . Anal fissure   . Gall stones   . Interstitial cystitis   . Chronic Dyspnea     a. 01/2013 Echo: EF 60-65%, Gr 1 DD, PASP 12mmHg.  . Chest pain     a. 01/2013 MV: EF 59%, no ischemia.  . Peptic ulcer   . C. difficile colitis   . Osteoporosis   . Syncope 09/10/2015   Past Surgical History  Procedure Laterality Date  . Cholecystectomy    . Appendectomy    . Tonsillectomy    . Hysterectomy - unknown type    . Knee surgery      x6  . Oophorectomy    . Dilation and curettage of uterus    . Breast biopsy    . Shoulder surgery      bilateral- bones spur  . Neck surgery      fusion  . Quadriceps repair Right   . Bladder surgery      x2  . Cataract extraction Bilateral   . Rectocele  repair      x2  . Enterocele repair      x2  . Cardiac catheterization    . Eye surgery      retina  . Shoulder arthroscopy with subacromial decompression, rotator cuff repair and bicep tendon repair  10/06/2012    Procedure: SHOULDER ARTHROSCOPY WITH SUBACROMIAL DECOMPRESSION, ROTATOR CUFF REPAIR AND BICEP TENDON REPAIR;  Surgeon: Nita Sells, MD;  Location: [facility];  Service: Orthopedics;  Laterality: Right;  Arthroscopic  Repair  of  Subscapularis, Open Biceps Tenodesis  . Bladder surgery    . Total knee arthroplasty     Family History  Problem Relation Age of Onset  . Heart attack Mother   . Stroke Mother   . Diabetes Mother   . Heart disease Mother   . Colon polyps Mother   . Breast cancer Maternal Grandmother   . Wilson's disease Maternal Grandmother   . Pancreatic cancer Maternal Grandfather   . Heart disease Father   . Heart attack Father   . Motor neuron disease Sister   .  Stroke Sister   . Lupus Sister   . Dementia Sister   . Arthritis/Rheumatoid Sister   . Asthma Sister   . Colon polyps Sister   . Irritable bowel syndrome Sister    Social History  Substance Use Topics  . Smoking status: Never Smoker   . Smokeless tobacco: Never Used  . Alcohol Use: No   OB History    No data available     Review of Systems  All other systems reviewed and are negative.   Allergies  Myrbetriq ; Percocet; Celebrex; Codeine; Darvocet; Erythromycin; Hydrocodone; Lyrica; Macrodantin; Toradol; Tramadol; and Verapamil  Home Medications   Prior to Admission medications   Medication Sig Start Date End Date Taking? Authorizing Provider  albuterol (PROVENTIL HFA;VENTOLIN HFA) 108 (90 Base) MCG/ACT inhaler Inhale 2 puffs into the lungs every 6 (six) hours as needed for wheezing or shortness of breath. 01/28/16   Jade L Breeback, PA-C  alendronate (FOSAMAX) 70 MG tablet Take 1 tablet by mouth  every 7 days with a full  glass of water on an empty  stomach  03/16/16   Hali Marry, MD  atorvastatin (LIPITOR) 40 MG tablet Take 1 tablet by mouth  daily 08/27/15   Thayer Headings, MD  calcium carbonate (OS-CAL) 600 MG TABS Take 600 mg by mouth daily with breakfast.     Historical Provider, MD  cetirizine (ZYRTEC) 10 MG tablet Take 10 mg by mouth as needed for allergies.    Historical Provider, MD  donepezil (ARICEPT) 5 MG tablet Take 1 tablet (5 mg total) by mouth at bedtime. 11/15/15   Kathrynn Ducking, MD  fluticasone [facility]) 50 MCG/ACT nasal spray Place into both nostrils as needed for allergies or rhinitis.    Historical Provider, MD  furosemide (LASIX) 20 MG tablet Take 1 tablet by mouth  daily 08/27/15   Thayer Headings, MD  gabapentin (NEURONTIN) 300 MG capsule Take 1 to 2 capsules by  mouth every night at  bedtime to two times daily 02/10/16   Silverio Decamp, MD  glucose blood test strip Use to test blood sugar once daily. Dx:R73.01 01/06/16   Hali Marry, MD  HYDROcodone-homatropine [facility]) 5-1.5 MG/5ML syrup Take 5 mLs by mouth at bedtime as needed for cough. Patient not taking: Reported on 03/11/2016 01/28/16   Hali Marry, MD  ipratropium (ATROVENT) 0.03 % nasal spray Place 2 sprays into both nostrils every 12 (twelve) hours. 07/17/15   Hali Marry, MD  leflunomide (ARAVA) 20 MG tablet Take 1 tablet by mouth daily. 12/17/14   Historical Provider, MD  metFORMIN (GLUCOPHAGE) 500 MG tablet TAKE 1 TABLET BY MOUTH TWICE DAILY WITH MEAL 11/01/15   Hali Marry, MD  methocarbamol (ROBAXIN) 500 MG tablet Take 1 tablet (500 mg total) by mouth 3 (three) times daily. 01/01/16   Silverio Decamp, MD  metoprolol (LOPRESSOR) 50 MG tablet Take 1 tablet by mouth two  times daily 08/27/15   Thayer Headings, MD  NON FORMULARY Place into the nose at bedtime. cpap machine    Historical Provider, MD  pantoprazole (PROTONIX) 40 MG tablet Take 1 tablet by mouth two  times daily 08/27/15   Thayer Headings, MD  primidone  (MYSOLINE) 50 MG tablet Take 1 tablet (50 mg total) by mouth at bedtime. 03/11/16   Silverio Decamp, MD  propranolol (INDERAL) 10 MG tablet Take 10 mg by mouth 3 (three) times daily as needed.     Historical  Provider, MD  venlafaxine XR (EFFEXOR-XR) 75 MG 24 hr capsule Take 1 capsule by mouth  daily with breakfast 12/10/15   Kathrynn Ducking, MD  vitamin D, CHOLECALCIFEROL, 400 UNITS tablet Take 400 Units by mouth daily.     Historical Provider, MD   Meds Ordered and Administered this Visit  Medications - No data to display  BP 116/75 mmHg  Pulse 83  Temp(Src) 98.2 F (36.8 C) (Oral)  Ht 5\' 6"  (1.676 m)  Wt 166 lb (75.297 kg)  BMI 26.81 kg/m2  SpO2 96% No data found.   Physical Exam  Constitutional: She is oriented to person, place, and time. She appears well-developed and well-nourished. No distress.  HENT:  Head: Normocephalic.  Eyes: Pupils are equal, round, and reactive to light.  Musculoskeletal:       Right foot: There is tenderness and bony tenderness. There is normal range of motion, no swelling, normal capillary refill, no crepitus, no deformity and no laceration.       Feet:  Patient has tenderness to palpation over dorsum of right foot as noted on diagram.  There is no swelling.  No tenderness to palpation over extensor tendons.  Distal neurovascular function is intact.     Neurological: She is alert and oriented to person, place, and time.  Skin: Skin is warm and dry.  Nursing note and vitals reviewed.   ED Course  Procedures none   Imaging Review Dg Foot Complete Right  03/22/2016  CLINICAL DATA:  Dorsal right foot pain.  Reported history is injury. EXAM: RIGHT FOOT COMPLETE - 3+ VIEW COMPARISON:  None. FINDINGS: Mild soft tissue swelling in the dorsal right foot at the level of the tarsal joints. No fracture, dislocation or suspicious focal osseous lesion. Minimal first metatarsophalangeal joint osteoarthritis. No radiopaque foreign bodies. IMPRESSION: Mild  dorsal right foot soft tissue swelling at the level of the tarsal joints. No fracture or malalignment. Electronically Signed   By: Ilona Sorrel M.D.   On: 03/22/2016 17:17      MDM   1. Right foot sprain, initial encounter    Ace wrap applied. Wear orthotics and well fitting athletic shoes while walking.  Apply ice pack for 20 to 30 minutes, 3 to 4 times daily  Continue until pain decreases.  Wear ace wrap as needed to control swelling.   May take Ibuprofen 200mg , 4 tabs every 8 hours with food.  Begin foot range of motion and stretching exercises as tolerated. Followup with Dr. Aundria Mems ([facility]) if not improving about two weeks.    Kandra Nicolas, MD 03/29/16 [phone]

## 2016-03-22 NOTE — ED Notes (Signed)
Patient stepped wrong this morning and now has excrutiating pain across top of foot; took Aleve without relief.

## 2016-03-22 NOTE — ED Notes (Signed)
Correction: incident happened yesterday.

## 2016-03-22 NOTE — Discharge Instructions (Signed)
Wear orthotics and well fitting athletic shoes while walking.  Apply ice pack for 20 to 30 minutes, 3 to 4 times daily  Continue until pain decreases.  Wear ace wrap as needed to control swelling.   May take Ibuprofen 200mg , 4 tabs every 8 hours with food.  Begin foot range of motion and stretching exercises as tolerated.   Foot Sprain A foot sprain is an injury to one of the strong bands of tissue (ligaments) that connect and support the many bones in your feet. The ligament can be stretched too much or it can tear. A tear can be either partial or complete. The severity of the sprain depends on how much of the ligament was damaged or torn. CAUSES A foot sprain is usually caused by suddenly twisting or pivoting your foot. RISK FACTORS This injury is more likely to occur in people who:  Play a sport, such as basketball or football.  Exercise or play a sport without warming up.  Start a new workout or sport.  Suddenly increase how long or hard they exercise or play a sport. SYMPTOMS Symptoms of this condition start soon after an injury and include:  Pain, especially in the arch of the foot.  Bruising.  Swelling.  Inability to walk or use the foot to support body weight. DIAGNOSIS This condition is diagnosed with a medical history and physical exam. You may also have imaging tests, such as:  X-rays to make sure there are no broken bones (fractures).  MRI to see if the ligament has torn. TREATMENT Treatment varies depending on the severity of your sprain. Mild sprains can be treated with rest, ice, compression, and elevation (RICE). If your ligament is overstretched or partially torn, treatment usually involves keeping your foot in a fixed position (immobilization) for a period of time. To help you do this, your health care provider will apply a bandage, splint, or walking boot to keep your foot from moving until it heals. You may also be advised to use crutches or a scooter for a few  weeks to avoid bearing weight on your foot while it is healing. If your ligament is fully torn, you may need surgery to reconnect the ligament to the bone. After surgery, a cast or splint will be applied and will need to stay on your foot while it heals. Your health care provider may also suggest exercises or physical therapy to strengthen your foot. HOME CARE INSTRUCTIONS If You Have a Bandage, Splint, or Walking Boot:  Wear it as directed by your health care provider. Remove it only as directed by your health care provider.  Loosen the bandage, splint, or walking boot if your toes become numb and tingle, or if they turn cold and blue. Bathing  If your health care provider approves bathing and showering, cover the bandage or splint with a watertight plastic bag to protect it from water. Do not let the bandage or splint get wet. Managing Pain, Stiffness, and Swelling   If directed, apply ice to the injured area:  Put ice in a plastic bag.  Place a towel between your skin and the bag.  Leave the ice on for 20 minutes, 2-3 times per day.  Move your toes often to avoid stiffness and to lessen swelling.  Raise (elevate) the injured area above the level of your heart while you are sitting or lying down. Driving  Do not drive or operate heavy machinery while taking pain medicine.  Do not drive while wearing  a bandage, splint, or walking boot on a foot that you use for driving. Activity  Rest as directed by your health care provider.  Do not use the injured foot to support your body weight until your health care provider says that you can. Use crutches or other supportive devices as directed by your health care provider.  Ask your health care provider what activities are safe for you. Gradually increase how much and how far you walk until your health care provider says it is safe to return to full activity.  Do any exercise or physical therapy as directed by your health care  provider. General Instructions  If a splint was applied, do not put pressure on any part of it until it is fully hardened. This may take several hours.  Take medicines only as directed by your health care provider. These include over-the-counter medicines and prescription medicines.  Keep all follow-up visits as directed by your health care provider. This is important.  When you can walk without pain, wear supportive shoes that have stiff soles. Do not wear flip-flops, and do not walk barefoot. SEEK MEDICAL CARE IF:  Your pain is not controlled with medicine.  Your bruising or swelling gets worse or does not get better with treatment.  Your splint or walking boot is damaged. SEEK IMMEDIATE MEDICAL CARE IF:  Your foot is numb or blue.  Your foot feels colder than normal.   This information is not intended to replace advice given to you by your health care provider. Make sure you discuss any questions you have with your health care provider.   Document Released: 04/03/2002 Document Revised: 02/26/2015 Document Reviewed: 08/15/2014 Elsevier Interactive Patient Education [employer].

## 2016-03-31 DIAGNOSIS — Z09 Encounter for follow-up examination after completed treatment for conditions other than malignant neoplasm: Secondary | ICD-10-CM | POA: Diagnosis not present

## 2016-03-31 DIAGNOSIS — M1990 Unspecified osteoarthritis, unspecified site: Secondary | ICD-10-CM | POA: Diagnosis not present

## 2016-03-31 DIAGNOSIS — M797 Fibromyalgia: Secondary | ICD-10-CM | POA: Diagnosis not present

## 2016-03-31 DIAGNOSIS — M818 Other osteoporosis without current pathological fracture: Secondary | ICD-10-CM | POA: Diagnosis not present

## 2016-04-01 ENCOUNTER — Encounter: Payer: Self-pay | Admitting: Family Medicine

## 2016-04-01 ENCOUNTER — Ambulatory Visit (INDEPENDENT_AMBULATORY_CARE_PROVIDER_SITE_OTHER): Payer: Medicare Other | Admitting: Family Medicine

## 2016-04-01 VITALS — BP 132/74 | HR 81 | Wt 177.0 lb

## 2016-04-01 DIAGNOSIS — I10 Essential (primary) hypertension: Secondary | ICD-10-CM | POA: Diagnosis not present

## 2016-04-01 DIAGNOSIS — E538 Deficiency of other specified B group vitamins: Secondary | ICD-10-CM

## 2016-04-01 DIAGNOSIS — N816 Rectocele: Secondary | ICD-10-CM | POA: Diagnosis not present

## 2016-04-01 DIAGNOSIS — R7301 Impaired fasting glucose: Secondary | ICD-10-CM | POA: Diagnosis not present

## 2016-04-01 DIAGNOSIS — E663 Overweight: Secondary | ICD-10-CM

## 2016-04-01 LAB — LIPID PANEL
Cholesterol: 134 mg/dL (ref 125–200)
HDL: 55 mg/dL (ref >=46)
LDL Cholesterol: 42 mg/dL (ref <130)
Total CHOL/HDL Ratio: 2.4 Ratio (ref <=5.0)
Triglycerides: 184 mg/dL — ABNORMAL HIGH (ref <150)
VLDL: 37 mg/dL — ABNORMAL HIGH (ref <30)

## 2016-04-01 LAB — BASIC METABOLIC PANEL
BUN: 14 mg/dL (ref 7–25)
CO2: 28 mmol/L (ref 20–31)
Calcium: 9.7 mg/dL (ref 8.6–10.4)
Chloride: 102 mmol/L (ref 98–110)
Creat: 0.78 mg/dL (ref 0.50–0.99)
Glucose, Bld: 94 mg/dL (ref 65–99)
Potassium: 4.3 mmol/L (ref 3.5–5.3)
Sodium: 139 mmol/L (ref 135–146)

## 2016-04-01 LAB — POCT UA - MICROALBUMIN
Albumin/Creatinine Ratio, Urine, POC: <30
Creatinine, POC: 50 mg/dL
Microalbumin Ur, POC: 10 mg/L

## 2016-04-01 LAB — POCT GLYCOSYLATED HEMOGLOBIN (HGB A1C): Hemoglobin A1C: 5.6

## 2016-04-01 MED ORDER — CYANOCOBALAMIN 1000 MCG/ML IJ SOLN
1000.0000 ug | Freq: Once | INTRAMUSCULAR | Status: AC
  Administered 2016-04-01: 1000 ug via INTRAMUSCULAR

## 2016-04-01 NOTE — Progress Notes (Signed)
Subjective:    CC: IFG   HPI:  IFG - No inc thrist or urination.   Pernicious anemia-she wants to know if she can get her B12 shot earlier today.  Hot flashes and sweats-she says overall it has gotten better since I last saw her. We did not do any treatment.  Tremor-she was recently started on primidone by Dr. Dianah Field. She's taking and has not had any side effects but says she hasn't really noticed much improvement in the tremor. Next  Abnormal weight gain-she is really struggling to lose weight. She really like to get down 245 pounds. She was swimming 3 days per week but says has gotten off track with that since her knee replacement but plans on getting back into it. She's been trying to drink protein shakes and really cut back on portion sizes and carb intake. She just isn't seeing the scale mood.   Past medical history, Surgical history, Family history not pertinant except as noted below, Social history, Allergies, and medications have been entered into the medical record, reviewed, and corrections made.   Review of Systems: No fevers, chills, night sweats, weight loss, chest pain, or shortness of breath.   Objective:    General: Well Developed, well nourished, and in no acute distress.  Neuro: Alert and oriented x3, extra-ocular muscles intact, sensation grossly intact.  HEENT: Normocephalic, atraumatic  Skin: Warm and dry, no rashes. Cardiac: Regular rate and rhythm, no murmurs rubs or gallops, no lower extremity edema.  Respiratory: Clear to auscultation bilaterally. Not using accessory muscles, speaking in full sentences.   Impression and Recommendations:   Impaired fasting glucose-Hemoccult A1c of 5.6 today which is down from 5.8. Great progress we'll continue to monitor and recheck again in 6 months. Next  Pernicious anemia-B12 injection given a little early today. Next  Hot flashes-improved. Next  Hypertension- well-controlled. Continue current regimen.  Next  Overweight/normal weight gain-discussed strategies to get back on track. Recommend the use of a smart phone application, fitness pal to help retract calories in addition to getting back into her regular exercise which she has not been able to do since her knee replacement. Thyroid is normal.   Rectocele - she is at the point of being ready to have her rectocele repaired. She was actually supposed to have it done before she ended up getting a knee replacement. Will begin touch with her and let her know to the colorectal surgeon is at [facility] surgery in [place].

## 2016-04-06 DIAGNOSIS — Z803 Family history of malignant neoplasm of breast: Secondary | ICD-10-CM | POA: Diagnosis not present

## 2016-04-06 DIAGNOSIS — Z1231 Encounter for screening mammogram for malignant neoplasm of breast: Secondary | ICD-10-CM | POA: Diagnosis not present

## 2016-04-06 LAB — HM MAMMOGRAPHY

## 2016-04-08 ENCOUNTER — Ambulatory Visit: Payer: Medicare Other

## 2016-04-14 ENCOUNTER — Ambulatory Visit (INDEPENDENT_AMBULATORY_CARE_PROVIDER_SITE_OTHER): Payer: Medicare Other

## 2016-04-14 ENCOUNTER — Ambulatory Visit (INDEPENDENT_AMBULATORY_CARE_PROVIDER_SITE_OTHER): Payer: Medicare Other | Admitting: Emergency Medicine

## 2016-04-14 ENCOUNTER — Encounter: Payer: Self-pay | Admitting: Family Medicine

## 2016-04-14 ENCOUNTER — Encounter: Payer: Self-pay | Admitting: Emergency Medicine

## 2016-04-14 ENCOUNTER — Ambulatory Visit (INDEPENDENT_AMBULATORY_CARE_PROVIDER_SITE_OTHER): Payer: Medicare Other | Admitting: Family Medicine

## 2016-04-14 ENCOUNTER — Inpatient Hospital Stay: Admit: 2016-04-14 | Payer: BLUE CROSS/BLUE SHIELD | Primary: Internal Medicine

## 2016-04-14 ENCOUNTER — Other Ambulatory Visit: Admit: 2016-04-14 | Discharge: 2016-04-14 | Payer: PRIVATE HEALTH INSURANCE | Primary: Internal Medicine

## 2016-04-14 VITALS — BP 154/91 | HR 69 | Wt 177.0 lb

## 2016-04-14 VITALS — BP 124/86 | HR 82 | Ht 66.0 in | Wt 178.0 lb

## 2016-04-14 DIAGNOSIS — G4733 Obstructive sleep apnea (adult) (pediatric): Secondary | ICD-10-CM | POA: Diagnosis not present

## 2016-04-14 DIAGNOSIS — R0600 Dyspnea, unspecified: Secondary | ICD-10-CM

## 2016-04-14 DIAGNOSIS — M79671 Pain in right foot: Secondary | ICD-10-CM

## 2016-04-14 DIAGNOSIS — R0609 Other forms of dyspnea: Secondary | ICD-10-CM

## 2016-04-14 DIAGNOSIS — Z0000 Encounter for general adult medical examination without abnormal findings: Secondary | ICD-10-CM

## 2016-04-14 LAB — CBC WITH AUTOMATED DIFF
ABS. BASOPHILS: 0 10*3/uL (ref 0.0–0.06)
ABS. EOSINOPHILS: 0.1 10*3/uL (ref 0.0–0.4)
ABS. LYMPHOCYTES: 1.2 10*3/uL (ref 0.9–3.6)
ABS. MONOCYTES: 0.4 10*3/uL (ref 0.05–1.2)
ABS. NEUTROPHILS: 4.6 10*3/uL (ref 1.8–8.0)
BASOPHILS: 0 % (ref 0–2)
EOSINOPHILS: 2 % (ref 0–5)
HCT: 41.6 % (ref 35.0–45.0)
HGB: 13.1 g/dL (ref 12.0–16.0)
LYMPHOCYTES: 19 % — ABNORMAL LOW (ref 21–52)
MCH: 30.2 PG (ref 24.0–34.0)
MCHC: 31.5 g/dL (ref 31.0–37.0)
MCV: 95.9 FL (ref 74.0–97.0)
MONOCYTES: 7 % (ref 3–10)
MPV: 9.7 FL (ref 9.2–11.8)
NEUTROPHILS: 72 % (ref 40–73)
PLATELET: 190 10*3/uL (ref 135–420)
RBC: 4.34 M/uL (ref 4.20–5.30)
RDW: 13.6 % (ref 11.6–14.5)
WBC: 6.3 10*3/uL (ref 4.6–13.2)

## 2016-04-14 LAB — METABOLIC PANEL, COMPREHENSIVE
A-G Ratio: 1.1 (ref 0.8–1.7)
ALT (SGPT): 33 U/L (ref 13–56)
AST (SGOT): 22 U/L (ref 15–37)
Albumin: 3.6 g/dL (ref 3.4–5.0)
Alk. phosphatase: 92 U/L (ref 45–117)
Anion gap: 10 mmol/L (ref 3.0–18)
BUN/Creatinine ratio: 13 (ref 12–20)
BUN: 9 MG/DL (ref 7.0–18)
Bilirubin, total: 0.3 MG/DL (ref 0.2–1.0)
CO2: 26 mmol/L (ref 21–32)
Calcium: 8.9 MG/DL (ref 8.5–10.1)
Chloride: 103 mmol/L (ref 100–108)
Creatinine: 0.71 MG/DL (ref 0.6–1.3)
GFR est AA: >60 mL/min/{1.73_m2} (ref >60)
GFR est non-AA: >60 mL/min/{1.73_m2} (ref >60)
Globulin: 3.4 g/dL (ref 2.0–4.0)
Glucose: 122 mg/dL — ABNORMAL HIGH (ref 74–99)
Potassium: 4.1 mmol/L (ref 3.5–5.5)
Protein, total: 7 g/dL (ref 6.4–8.2)
Sodium: 139 mmol/L (ref 136–145)

## 2016-04-14 LAB — LIPID PANEL
CHOL/HDL Ratio: 3.3 (ref 0–5.0)
Cholesterol, total: 153 MG/DL (ref <200)
HDL Cholesterol: 46 MG/DL (ref 40–60)
LDL, calculated: 79 MG/DL (ref 0–100)
Triglyceride: 140 MG/DL (ref <150)
VLDL, calculated: 28 MG/DL

## 2016-04-14 LAB — TSH 3RD GENERATION: TSH: 1.92 u[IU]/mL (ref 0.36–3.74)

## 2016-04-14 NOTE — Assessment & Plan Note (Signed)
Certainly could have a contribution of her known neuromuscular disease. She also has a syndrome that sounds consistent with asthma but which is only present in the setting certain triggers. Her spirometry from 2015 did not confirm obstruction but did show bronchodilator reversibility in the FEF 25-75%. I like to answer this question definitively with a methacholine challenge, possibly start scheduled bronchodilators to see if she benefits depending on the results.Mia [name]

## 2016-04-14 NOTE — Patient Instructions (Signed)
Thank you for coming in today. Use the rigid sole shoe.  Return in about 3 weeks.  Let me know if you worsen.   Stress Fracture Stress fracture is a small break or crack in a bone. A stress fracture can be fully broken (complete) or partially broken (incomplete). The most common sites for stress fractures are the bones in the front of your feet (metatarsals), your heels (calcaneus), and the long bone of your lower leg (tibia). CAUSES A stress fracture is caused by overuse or repetitive exercise, such as running. It happens when a bone cannot absorb any more shock because the muscles around it are weak. Stress fractures happen most commonly when:  You rapidly increase or start a new physical activity.  You use shoes that are worn out or do not fit you properly.  You exercise on a new surface. RISK FACTORS You may be at higher risk for this type of fracture if:  You have a condition that causes weak bones (osteoporosis).  You are female. Stress fractures are more likely to occur in women. SIGNS AND SYMPTOMS The most common symptom of a stress fracture is feeling pain when you are using the affected part of your body. The pain usually goes away when you are resting. Other symptoms may include:  Swelling of the affected area.  Pain in the area when it is touched.  Decreased pain while resting. Stress fracture pain usually develops over time. DIAGNOSIS Diagnosis may include:   Medical history and physical exam.  X-rays.  Bone scan.  MRI. TREATMENT Treatment depends on the severity of your stress fracture. Treatment usually involves resting, icing, compression, and elevation (RICE) of the affected part of your body. Treatment may also include:  Medicines to reduce inflammation.  A cast or a walking shoe.  Crutches.  Surgery. HOME CARE INSTRUCTIONS If You Have a Cast:  Do not stick anything inside the cast to scratch your skin. Doing that increases your risk of  infection.  Check the skin around the cast every day. Report any concerns to your health care provider. You may put lotion on dry skin around the edges of the cast. Do not apply lotion to the skin underneath the cast.  Keep the cast clean and dry.  Cover the cast with a watertight plastic bag to protect it from water while you take a bath or a shower. Do not let the cast get wet.  Do not put pressure on any part of the cast until it is fully hardened. This may take several hours. If You Have a Walking Shoe:  Wear it as directed by your health care provider. Managing Pain, Stiffness, and Swelling  If directed, apply ice to the injured area:  Put ice in a plastic bag.  Place a towel between your skin and the bag.  Leave the ice on for 20 minutes, 2-3 times per day.  Move your fingers or toes often to avoid stiffness and to lessen swelling.  Raise the injured area above the level of your heart while you are sitting or lying down. Activity  Rest as directed by your health care provider. Ask your health care provider if you may do alternative exercises, such as swimming or biking, while you are healing.  Return to your normal activities as directed by your health care provider. Ask your health care provider what activities are safe for you.  Perform range-of-motion exercises only as directed by your health care provider. Safety  Do not use  the injured limb to support yourbody weight until your health care provider says that you can. Use crutches if your health care provider tells you to do so. General Instructions  Do not use any tobacco products, including cigarettes, chewing tobacco, or electronic cigarettes. Tobacco can delay bone healing. If you need help quitting, ask your health care provider.  Take medicines only as directed by your health care provider.  Keep all follow-up visits as directed by your health care provider. This is important. PREVENTION  Only wear shoes  that:  Fit well.  Are not worn out.  Eat a healthy diet that contains vitamin D and calcium. This helps keeps your bones strong.  Be careful when you start a new physical activity. Give your body time to adjust.  Avoid doing only one kind of activity. Do different exercises, such as swimming and running, so that no single part of your body gets overused.  Do strength-training exercises. SEEK MEDICAL CARE IF:  Your pain gets worse.  You have new symptoms.  You have increased swelling. SEEK IMMEDIATE MEDICAL CARE IF:   You lose feeling in the affected area.   This information is not intended to replace advice given to you by your health care provider. Make sure you discuss any questions you have with your health care provider.   Document Released: 01/02/2003 Document Revised: 11/02/2014 Document Reviewed: 05/17/2014 Elsevier Interactive Patient Education [employer].

## 2016-04-14 NOTE — Progress Notes (Signed)
History of Present Illness:  65 yo woman never smoker with hx paroxismal SVT, Holter showed correlation at the time with exertional dyspnea and lightheadedness. PFT, methacholine and CPEX were reassuring except exercise limited by tachycardia. Has undergone CPAP titration and needs 14cm H2O pressure   ROV 07/30/09 -- Not using nasal pillows, using the nasal mask. Has been switched to auto-titration device due to difficulty tolerating 14cm H2O. Has had better compliance with latest mask. Wears it every night, When she wakes up in the middle of the night she takes it off. Can't really tell any difference in how she feels during the day - does have less fibromyalgia pain since she's been using it.   January 10, 2010--Presents for acute office visit. Complains of occ pain in left upper back wrapping around to the left breast when she takes a deep breath x8weeks. Happens intermittently, sore to touch, pain is under shoulder blade w/ radiation around to rbs. Use muscle relaxer patch did not help, no other meds used. Denies chest pain, dyspnea, orthopnea, hemoptysis, fever, n/v/d, edema, headache,recent travel or antibiotics.   ROV 08/25/10 -- Hx OSA and HTN. Was started on diltiazem about 6 weeks ago. Began to develop B LE edema, L>R, about 2 weeks ago. US showed no DVT, CT scan chest done, no PE showed "? PNA" so treated with levaquin. She had absolutely no symptoms at that time. She has been having some exertional dyspnea, occasionally feels her tachycardia, not sure the two correlate. Tells me she hasn't worn her CPAP in about 4 months because her mask is hurting her face.   ROV 09/04/10 -- Returns to f/u her edema, exertional SOB. I performed 24h urine, TTE last time - both are reassuring. She has not been wearing her CPAP mask reliably, needs a new mask.   ROV 02/08/13 -- return visit with hx OSA (on CPAP), HTN, SVT, exertional dyspnea s/p extensive w/u (negative methacholine, reassuring CPST. She was seen  by Dr Acie Fredrickson 01/24/13 for similar sx, tachycardia. Underwent TTE and stress testing >> normal. Her HR and BP were high. She has been off of CPAP for two years. She started back 2 weeks ago. Just got a new full face mask > having leakage problem. [employer] is the company. She says she is on auto-set, not 14cm H2O.   ROV 06/25/14 -- follows up for chronic dyspnea (w/u as above), OSA. She has been having trouble with R knee pain, R great toe pain. She has has hx pseudogout. She has been wearing her CPAP very rarely. She has the most difficulty trying to go to sleep, often gets waked up by a leak. She had spirometry 06/20/14 >> normal AF's, ? Small airways responsiveness. Her methacholine has previously been negative.    ROV 09/18/14 -- follow up visit for OSA and dyspnea with reassuring spirometry. 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 She has gotten a new CPAP and new mask > has been wearing reliably. She was seen in cardiology 11/13 for continued dyspnea, was noted to have exertional tachycardia. He is planning to repeat a stress test in the future. She tells me that she has been wearing her C Pap reliably averaging about 6 hours a night. She feels that it is helped her and is having less daytime sleepiness. A recent download from her device from 10/24-11/22/15 showed that she used her device for greater than 4 hours 77% of the time.   ROV 3//9/17 -- follow-up visit for obstructive sleep apnea. She  has a CPAP download available between December 7 and March 6. This shows usage on 14% of days only.  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 She has been sleeping on couch, had a huge change in her sleep habits in the time reflected in the download above. She tells me also that she has had 2 syncopal episodes in November '16. Seen by neuro - no evidence seizures. Cardiology performed rhythm monitor that was reassuring.   ROV 04/14/16 -- patient has  a history of obstructive sleep apnea, working to achieve better compliance with CPAP. As noted she had been having difficulty using. We have a download available today wean May and June that showed 87% compliance with 87% usage greater than 4 hours. The only time she didn't use it was when she was at a conference out of town. She feels definite benefits. She was seen by PCP last month for possible asthma flare > treated w abx and pred and BD's. She feels benefit from the BD. Her spiro in 2015 had a BD response on the 25-75%, ? Mild asthma.    Filed Vitals:   04/14/16 1115 04/14/16 1116  BP:  124/86  Pulse:  82  Height: 5\' 6"  (1.676 m)   Weight: 178 lb (80.74 kg)   SpO2:  96%   Gen: Pleasant, well-nourished, in no distress,  normal affect  ENT: No lesions,  mouth clear,  oropharynx clear, no postnasal drip  Neck: No JVD, no TMG, no carotid bruits  Lungs: No use of accessory muscles, no dullness to percussion, clear without rales or rhonch. Cardiovascular: RRR, heart sounds normal, no murmur or gallops, no peripheral edema  Musculoskeletal: No deformities, no cyanosis or clubbing  Neuro: alert, non focal  Skin: Warm, no lesions or rashes    Cardiac Stress Test 02/01/13: Impression  Exercise Capacity: Lexiscan with low level exercise.  BP Response: Normal blood pressure response.  Clinical Symptoms: There is dyspnea.  ECG Impression: No significant ST segment change suggestive of ischemia.  Comparison with Prior Nuclear Study: No significant change from previous study  Overall Impression: Normal stress nuclear study. No evidence of ischemia. Heart rate accelerated very rapidly on walking lexiscan protocol.  LV Ejection Fraction: 59%. LV Wall Motion: NL LV Function; NL Wall Motion  OSA (obstructive sleep apnea) She has worked hard to improve her compliance and now her download confirms that she is doing very well. 87% compliance for greater than 4 hours a night. She is clearly  benefiting from usage. She is more rested, has more energy  DOE (dyspnea on exertion) Certainly could have a contribution of her known neuromuscular disease. She also has a syndrome that sounds consistent with asthma but which is only present in the setting certain triggers. Her spirometry from 2015 did not confirm obstruction but did show bronchodilator reversibility in the FEF 25-75%. I like to answer this question definitively with a methacholine challenge, possibly start scheduled bronchodilators to see if she benefits depending on the results.Baltazar Apo, MD, PhD 04/14/2016, 11:55 AM [facility] [phone] or if no answer [phone]

## 2016-04-14 NOTE — Assessment & Plan Note (Signed)
She has worked hard to improve her compliance and now her download confirms that she is doing very well. 87% compliance for greater than 4 hours a night. She is clearly benefiting from usage. She is more rested, has more energy

## 2016-04-14 NOTE — Progress Notes (Signed)
   Subjective:    I'm seeing this patient as a consultation for:  Dr Madilyn Fireman and Dr Assunta Found  CC: Right Foot Pain  HPI: Patient has a one-month history of right foot pain and swelling slowly worsening. She denies any specific injury. She was seen in urgent care on May 28 where x-rays were unremarkable. She denies any fevers or chills nausea vomiting or diarrhea. She's used some ibuprofen and rest. Helped a bit. She notes worsening pain and swelling recently.  Past medical history, Surgical history, Family history not pertinant except as noted below, Social history, Allergies, and medications have been entered into the medical record, reviewed, and no changes needed.   Review of Systems: No headache, visual changes, nausea, vomiting, diarrhea, constipation, dizziness, abdominal pain, skin rash, fevers, chills, night sweats, weight loss, swollen lymph nodes, body aches, joint swelling, muscle aches, chest pain, shortness of breath, mood changes, visual or auditory hallucinations.   Objective:    Filed Vitals:   04/14/16 1435  BP: 154/91  Pulse: 69   General: Well Developed, well nourished, and in no acute distress.  Neuro/Psych: Alert and oriented x3, extra-ocular muscles intact, able to move all 4 extremities, sensation grossly intact. Skin: Warm and dry, no rashes noted.  Respiratory: Not using accessory muscles, speaking in full sentences, trachea midline.  Cardiovascular: Pulses palpable, no extremity edema. Abdomen: Does not appear distended. MSK: Right foot: Swollen and mildly tender dorsally along the second third and fourth and fifth metatarsals. Pulses capillary refill sensation intact. Normal ankle motion. Mild antalgic gait.  X-ray right foot dated 03/22/2016 reviewed  No results found for this or any previous visit (from the past 24 hour(s)). No results found.  Impression and Recommendations:    Assessment and Plan: 65 y.o. female with Right foot pain and swelling  concerning for metatarsal stress fracture. Plan to repeat x-ray and treat empirically with postoperative shoe. Return in 3 weeks or so. If improved continue treatment if not improved recommend MRI to further evaluate cause of pain and swelling. NSAIDs for pain control.Marland Kitchen

## 2016-04-14 NOTE — Patient Instructions (Signed)
We will perform a methacholine pulmonary function test to look for mild underlying  Depending on your testing we may decide to start scheduled inhaled medication or perform other testing  Please continue the good work with your CPAP every night.  Follow with Dr Lamonte Sakai next available following your breathing tests

## 2016-04-15 LAB — HEMOGLOBIN A1C W/O EAG: Hemoglobin A1c: 6.2 % — ABNORMAL HIGH (ref 4.2–5.6)

## 2016-04-15 LAB — HEPATITIS C AB
Hep C virus Ab Interp.: NEGATIVE
Hepatitis C virus Ab: 0.05 Index (ref <0.80)

## 2016-04-15 LAB — HEPATITIS C ANTIBODY
HCV Ab: 0.05 Index (ref <0.80)
Hepatitis C Ab: NEGATIVE

## 2016-04-15 NOTE — Progress Notes (Signed)
Quick Note:  Xray foot is normal. We will MRI if not better in a few weeks. ______

## 2016-04-17 ENCOUNTER — Ambulatory Visit (INDEPENDENT_AMBULATORY_CARE_PROVIDER_SITE_OTHER): Payer: Medicare Other | Admitting: Physician Assistant

## 2016-04-17 ENCOUNTER — Telehealth: Payer: Self-pay | Admitting: Physician Assistant

## 2016-04-17 VITALS — BP 145/62 | HR 63 | Temp 97.4°F | Resp 16

## 2016-04-17 DIAGNOSIS — T733XXA Exhaustion due to excessive exertion, initial encounter: Secondary | ICD-10-CM | POA: Diagnosis not present

## 2016-04-17 DIAGNOSIS — E538 Deficiency of other specified B group vitamins: Secondary | ICD-10-CM | POA: Diagnosis not present

## 2016-04-17 DIAGNOSIS — R5383 Other fatigue: Secondary | ICD-10-CM

## 2016-04-17 DIAGNOSIS — R350 Frequency of micturition: Secondary | ICD-10-CM | POA: Diagnosis not present

## 2016-04-17 DIAGNOSIS — R5382 Chronic fatigue, unspecified: Secondary | ICD-10-CM

## 2016-04-17 LAB — POCT URINALYSIS DIPSTICK
Bilirubin, UA: NEGATIVE
Blood, UA: NEGATIVE
Glucose, UA: NEGATIVE
Ketones, UA: NEGATIVE
Leukocytes, UA: NEGATIVE
Nitrite, UA: NEGATIVE
Protein, UA: NEGATIVE
Spec Grav, UA: <=1.005
Urobilinogen, UA: 0.2
pH, UA: 5.5

## 2016-04-17 LAB — CBC WITH DIFFERENTIAL/PLATELET
Basophils Absolute: 72 cells/uL (ref 0–200)
Basophils Relative: 1 %
Eosinophils Absolute: 144 cells/uL (ref 15–500)
Eosinophils Relative: 2 %
HCT: 39.7 % (ref 35.0–45.0)
Hemoglobin: 12.8 g/dL (ref 11.7–15.5)
Lymphocytes Relative: 32 %
Lymphs Abs: 2304 cells/uL (ref 850–3900)
MCH: 30.7 pg (ref 27.0–33.0)
MCHC: 32.2 g/dL (ref 32.0–36.0)
MCV: 95.2 fL (ref 80.0–100.0)
MPV: 10.7 fL (ref 7.5–12.5)
Monocytes Absolute: 936 cells/uL (ref 200–950)
Monocytes Relative: 13 %
Neutro Abs: 3744 cells/uL (ref 1500–7800)
Neutrophils Relative %: 52 %
Platelets: 354 10*3/uL (ref 140–400)
RBC: 4.17 MIL/uL (ref 3.80–5.10)
RDW: 14 % (ref 11.0–15.0)
WBC: 7.2 10*3/uL (ref 3.8–10.8)

## 2016-04-17 NOTE — Progress Notes (Signed)
   Subjective:    Patient ID: Mia [name], female    DOB: 1951/02/20, 65 y.o.   MRN: [id number]  HPIPatient reports unusual fatigue; also reports increase in activities along with wearing post op boot. Denies shortness of breath, dizziness, chest pain. Laughing during interview.    Review of Systems     Objective:   Physical Exam        Assessment & Plan:  UA collected; urine culture to be sent; labs to be drawn as ordered by [facility]. Fatigue panel ordered.  Follow up as needed.

## 2016-04-17 NOTE — Telephone Encounter (Signed)
Pt called stating she is very fatigued and has no energy. Pt states she is so tired she doesn't even want to talk, and she "loves to talk." Pt questions what OTC vitamin she can take or if she should get her Vit B12 levels checked again (she is getting monthly B12 injections). Pt states there is a womens energy vitamin at [facility] that she is thinking of taking. Pt reports it has "caffiene and a lot of vitamins in it." Unable to tell me what vitamins are included. Will route to a Provider in office for review.

## 2016-04-17 NOTE — Telephone Encounter (Signed)
Pt advised, nurse visit scheduled, labs ordered.

## 2016-04-17 NOTE — Telephone Encounter (Signed)
We can check b12 in bloodwork to see if need to do b12 more frequently.  Be careful with caffeine would not want to cause SVT to act up.  If fatigue was all of a sudden we might need to look for infectious cause...UTI.  Nurse visit UA, cbc,cmp and b12 ok to do.

## 2016-04-18 LAB — COMPLETE METABOLIC PANEL WITH GFR
ALT: 20 U/L (ref 6–29)
AST: 21 U/L (ref 10–35)
Albumin: 4.1 g/dL (ref 3.6–5.1)
Alkaline Phosphatase: 71 U/L (ref 33–130)
BUN: 14 mg/dL (ref 7–25)
CO2: 26 mmol/L (ref 20–31)
Calcium: 9.4 mg/dL (ref 8.6–10.4)
Chloride: 99 mmol/L (ref 98–110)
Creat: 0.79 mg/dL (ref 0.50–0.99)
GFR, Est African American: >89 mL/min (ref >=60)
GFR, Est Non African American: 79 mL/min (ref >=60)
Glucose, Bld: 101 mg/dL — ABNORMAL HIGH (ref 65–99)
Potassium: 4.4 mmol/L (ref 3.5–5.3)
Sodium: 135 mmol/L (ref 135–146)
Total Bilirubin: 0.3 mg/dL (ref 0.2–1.2)
Total Protein: 6.7 g/dL (ref 6.1–8.1)

## 2016-04-18 LAB — VITAMIN B12: Vitamin B-12: 390 pg/mL (ref 200–1100)

## 2016-04-19 LAB — URINE CULTURE: Colony Count: 50000

## 2016-04-21 ENCOUNTER — Other Ambulatory Visit: Payer: Self-pay

## 2016-04-21 ENCOUNTER — Ambulatory Visit
Admit: 2016-04-21 | Discharge: 2016-04-21 | Payer: PRIVATE HEALTH INSURANCE | Attending: Internal Medicine | Primary: Internal Medicine

## 2016-04-21 DIAGNOSIS — R350 Frequency of micturition: Secondary | ICD-10-CM

## 2016-04-21 DIAGNOSIS — R7301 Impaired fasting glucose: Secondary | ICD-10-CM

## 2016-04-21 NOTE — ACP (Advance Care Planning) (Signed)
Do you have an Advance Care Planning? Yes   Would you like to receive some information about ACP? Requested that she brings in a copy during her next visit.

## 2016-04-21 NOTE — Progress Notes (Signed)
Chief Complaint   Patient presents with   ??? Anxiety     f/u   ??? Results     lab review   ??? Cholesterol Problem     f/u   ??? Vitamin D Deficiency     f/u     Patient stated that she is here for routine follow up visit with lab review.    1. Have you been to the ER, urgent care clinic since your last visit?  Hospitalized since your last visit?No    2. Have you seen or consulted any other health care providers outside of the [facility] since your last visit?  Include any pap smears or colon screening. Yes dermatologist

## 2016-04-21 NOTE — Progress Notes (Signed)
65 y.o. white female who presents for f/u    No exercise mainly due to motivational issues.  She retired but is so busy w remodeling the house and also taking care of elderly patients, she has little time    She did go back for osa eval w Dr Manuella Ghazi although she was lost to f/u w their transition of out of [facility], needs f/u    FBS <110, not checking pps.  Denies polyuria, polydipsia, nocturia, vision change.  Weight is about the same    Vitals 04/21/2016 10/18/2015 07/17/2015 04/18/2015 12/05/2014   Weight 172 lb 9.6 oz 172 lb 166 lb 166 lb 168 lb     Also needs colo done and she wants to see Dr Rosendo Gros again.  No gi or gu complaints    No sx referable to the thyroid    Past Medical History:   Diagnosis Date   ??? Allergic rhinitis    ??? Anxiety state, unspecified    ??? Arthritis     Dr. Synetta Shadow, Dr. Marisa Hua   ??? Compression fx, thoracic spine [facility]) 2007    negative DEXA Dr. Marisa Hua   ??? Dyslipidemia    ??? Dyspepsia    ??? Frozen shoulder     right Dr. Wynonia Hazard MRI   ??? Multiple lung nodules     no change 10/06, 03/07, 03/08   ??? Overweight (BMI 25.0-29.9)    ??? Palpitations 2008    neg thallium 2008, nl holter 2008, echo nl lv/ef 65%/tr mr/dd/nl pasp   ??? Prediabetes    ??? Syncope     neurocardiogenic by tilt 1994   ??? Venous insufficiency    ??? Vitamin D deficiency      Past Surgical History:   Procedure Laterality Date   ??? ECHO 2D ADULT  12/04    normal with EF 70%   ??? HX COLONOSCOPY      Dr Rosendo Gros 2007 negative   ??? HX GYN      s/p BTL    ??? HX HEMORRHOIDECTOMY      Dr. Rosendo Gros 2007   ??? STRESS TEST THALLIUM STUDY      2005 neg; 1/17 negative ef 75%   ??? [place] ABD COMP  9/05    negative   ??? VAS CAROTID DUPLEX BILATERAL  2004    negative     Social History     Social History   ??? Marital status: MARRIED     Spouse name: N/A   ??? Number of children: 2   ??? 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 Smoking status: Never Smoker   ??? Smokeless tobacco: Not on file    ??? Alcohol use 2.4 oz/week     4 Glasses of wine per week   ??? Drug use: No   ??? Sexual activity: Not on file     Other Topics Concern   ??? Not on file     Social History Narrative     Current Outpatient Prescriptions   Medication Sig   ??? atorvastatin (LIPITOR) 40 mg tablet take 1 tablet by mouth once daily   ??? citalopram (CELEXA) 10 mg tablet take 1 tablet by mouth once daily   ??? lansoprazole (PREVACID) 30 mg capsule take 1 capsule by mouth once daily BEFORE BREAKFAST   ??? VITAMIN D2 50,000 unit capsule take 1 capsule by mouth every week   ???  omega-3 fatty acids (FISH OIL) cap Take 2 Tabs by mouth daily.   ??? multivitamin (ONE A DAY) tablet Take 1 Tab by mouth daily.   ??? aspirin 81 mg chewable tablet Take 81 mg by mouth daily.     No current facility-administered medications for this visit.      Allergies   Allergen Reactions   ??? Fish Oil Nausea Only   ??? Relafen [Nabumetone] Nausea Only     REVIEW OF SYSTEMS: gyn 2012 Dr. Gunnar Bulla in [place], [place] 6/14, colo 2007 Dr Rosendo Gros, [place] 2007? Dr Margaretann Loveless ??? no vision change or eye pain  Oral ??? no mouth pain, tongue or tooth problems  Ears ??? no hearing loss, ear pain, fullness, no swallowing problems  Cardiac ??? no CP, PND, orthopnea, edema, palpitations or syncope  Chest ??? no breast masses  Resp ??? no wheezing, chronic coughing, dyspnea  Urinary ??? no dysuria, hematuria, flank pain, urgency, frequency  Ortho ??? no swelling, dec ROM, myalgias  Psych ??? denies any anxiety or depression symptoms, no hallucinations or violent ideation  Endo - no polyuria, polydipsia, nocturia, hot flashes    Visit Vitals   ??? BP 112/74 (BP 1 Location: Right arm, BP Patient Position: Sitting)   ??? Pulse 93   ??? Temp 98.3 ??F (36.8 ??C) (Oral)   ??? Resp 17   ??? Ht '5\' 4"'  (1.626 m)   ??? Wt 172 lb 9.6 oz (78.3 kg)   ??? SpO2 96%   ??? BMI 29.63 kg/m2   Affect is appropriate.  Mood stable  No apparent distress  HEENT --Anicteric sclerae.  No JVD, or bruits.  Questionable fullness in the thyroid possible goiter?   Lungs --Clear to auscultation and percussion, normal percussion.  Heart --Regular rate and rhythm, no murmurs, rubs, gallops, or clicks.  Abdomen -- Soft and nontender, no hepatosplenomegaly or masses.  Extremities -- Without cyanosis, clubbing, edema. 2+ pulses equally and bilaterally.    LABS  From 5/10 showed   gluc 110, cr 0.70,               alt 23,                                    chol 154, tg 143, hdl 45, ldl-c 80,                                                            tsh 1.91  From 5/10 showed                      2 hr GTT 89  From 8/10 showed                                                               vit d 23.0, ck 57, aldo 5.4  From 8/11 showed   gluc 108,  hba1c 6.3,                   chol 160, tg 172, hdl 39, ldl-c 87,  wbc 5.,7 hb 12.3, plt 235, ua neg,     tsh 2.28  From 5/12 showed   gluc 113, cr 0.71, gfr 94,  alt 16, hba1c 6.2, ldl-p 1989, chol 177, tg 161, hdl 43, ldl-c 102  From 11/12 showed                                                     hba1c 5.9, ldl-p 1218, chol 133, tg 116, hdl 45, ldl-c 65  From 5/13 showed   gluc 105, cr 0.55, gfr 102, alt 8,  hba1c 6.2,                   chol 148, tg 107, hdl 45, ldl-c 82,  wbc 5.0, hb 12.5, plt 172, vit d 40.3  From 11/13 showed        hba1c 6.2, ldl-p 1888, chol 176, tg 155, hdl 49, ldl-c 86,  wbc 6.2, hb 12.3, plt 182, vit d 34.4  From 5/14 showed        hba1c 6.2,     chol 152, tg 157, hdl 39, ldl-c 82  From 1/15 showed   gluc 113, cr 0.68, gfr>60, alt 11, hba1c 6.2,     chol 146, tg 130, hdl 43, ldl-c 77  From 7/15 showed        hba1c 6.3,     chol 133, tg 124, hdl 39, ldl-c 69, wbc 6.3, hb 12.2, plt 193  From 6/16 showed   gluc 106, cr 0.74, gfr>60, alt 26, hba1c 6.3,     chol 126, tg 125, hdl 46, ldl-c 55, wbc 5.7, hb 13.2, plt 203, vit d 7.2,   tsh 1.69  From 12/16 showed gluc 110, cr 0.68, gfr>60, alt 30, hba1c 6.1,     chol 135, tg 147, hdl 47, ldl-c 59, wbc 6.0, hb 12.7, plt 197     Results for orders placed or performed during the hospital encounter of 04/14/16   CBC WITH AUTOMATED DIFF   Result Value Ref Range    WBC 6.3 4.6 - 13.2 K/uL    RBC 4.34 4.20 - 5.30 M/uL    HGB 13.1 12.0 - 16.0 g/dL    HCT 41.6 35.0 - 45.0 %    MCV 95.9 74.0 - 97.0 FL    MCH 30.2 24.0 - 34.0 PG    MCHC 31.5 31.0 - 37.0 g/dL    RDW 13.6 11.6 - 14.5 %    PLATELET 190 135 - 420 K/uL    MPV 9.7 9.2 - 11.8 FL    NEUTROPHILS 72 40 - 73 %    LYMPHOCYTES 19 (L) 21 - 52 %    MONOCYTES 7 3 - 10 %    EOSINOPHILS 2 0 - 5 %    BASOPHILS 0 0 - 2 %    ABS. NEUTROPHILS 4.6 1.8 - 8.0 K/UL    ABS. LYMPHOCYTES 1.2 0.9 - 3.6 K/UL    ABS. MONOCYTES 0.4 0.05 - 1.2 K/UL    ABS. EOSINOPHILS 0.1 0.0 - 0.4 K/UL    ABS. BASOPHILS 0.0 0.0 - 0.06 K/UL    DF AUTOMATED  LIPID PANEL   Result Value Ref Range    LIPID PROFILE          Cholesterol, total 153 <200 MG/DL    Triglyceride 140 <150 MG/DL    HDL Cholesterol 46 40 - 60 MG/DL    LDL, calculated 79 0 - 100 MG/DL    VLDL, calculated 28 MG/DL    CHOL/HDL Ratio 3.3 0 - 5.0     METABOLIC PANEL, COMPREHENSIVE   Result Value Ref Range    Sodium 139 136 - 145 mmol/L    Potassium 4.1 3.5 - 5.5 mmol/L    Chloride 103 100 - 108 mmol/L    CO2 26 21 - 32 mmol/L    Anion gap 10 3.0 - 18 mmol/L    Glucose 122 (H) 74 - 99 mg/dL    BUN 9 7.0 - 18 MG/DL    Creatinine 0.71 0.6 - 1.3 MG/DL    BUN/Creatinine ratio 13 12 - 20      GFR est AA >60 >60 ml/min/1.80m    GFR est non-AA >60 >60 ml/min/1.75m   Calcium 8.9 8.5 - 10.1 MG/DL    Bilirubin, total 0.3 0.2 - 1.0 MG/DL    ALT (SGPT) 33 13 - 56 U/L    AST (SGOT) 22 15 - 37 U/L    Alk. phosphatase 92 45 - 117 U/L    Protein, total 7.0 6.4 - 8.2 g/dL    Albumin 3.6 3.4 - 5.0 g/dL    Globulin 3.4 2.0 - 4.0 g/dL    A-G Ratio 1.1 0.8 - 1.7     TSH 3RD GENERATION   Result Value Ref Range    TSH 1.92 0.36 - 3.74 uIU/mL   HEMOGLOBIN A1C W/O EAG   Result Value Ref Range    Hemoglobin A1c 6.2 (H) 4.2 - 5.6 %   HEPATITIS C AB   Result Value Ref Range     Hepatitis C virus Ab 0.05 <0.80 Index    Hep C  virus Ab Interp. NEGATIVE  NEG      Hep C  virus Ab comment           Patient Active Problem List   Diagnosis Code   ??? Vitamin D deficiency E55.9   ??? Anxiety F41.9   ??? Dyslipidemia E78.5   ??? 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 Overweight (BMI 25.0-29.9) E66.3   ??? IFG (impaired fasting glucose) R73.01     Assessment and plan:  1. Prediabetes. Lifestyle and dietary modification reiterated. Weight loss advocated. She declined nutrition consult  2. Hyperlipidemia. Continue current  3. Hypovitaminosis D. Continue supp  4. Anxiety.  Continue celexa  5. Overweight.  Dec app suppressants in past, reiterated portion control and 150 min/week exercise, declined nutrition/dietician as above  6. R/O sleep apnea.  Sched w Dr ShManuella Ghazior f/u  7. COlo scheduled w Dr RaRosendo Gros      RTC 12/17    Above conditions discussed at length and patient vocalized understanding.  All questions answered to patient satifaction

## 2016-04-23 ENCOUNTER — Ambulatory Visit (HOSPITAL_COMMUNITY)
Admission: RE | Admit: 2016-04-23 | Discharge: 2016-04-23 | Disposition: A | Discharge status: Home / Self Care | Payer: Medicare Other | Source: Ambulatory Visit | Attending: Emergency Medicine | Admitting: Emergency Medicine

## 2016-04-23 DIAGNOSIS — R0609 Other forms of dyspnea: Secondary | ICD-10-CM | POA: Insufficient documentation

## 2016-04-23 DIAGNOSIS — R0600 Dyspnea, unspecified: Secondary | ICD-10-CM

## 2016-04-23 LAB — URINE CULTURE
Colony Count: NO GROWTH
Organism ID, Bacteria: NO GROWTH

## 2016-04-23 LAB — PULMONARY FUNCTION TEST
FEF 25-75 Post: 2.22 L/sec
FEF 25-75 Pre: 2.88 L/sec
FEF2575-%Change-Post: -22 %
FEF2575-%Pred-Post: 98 %
FEF2575-%Pred-Pre: 127 %
FEV1-%Change-Post: -3 %
FEV1-%Pred-Post: 90 %
FEV1-%Pred-Pre: 93 %
FEV1-Post: 2.36 L
FEV1-Pre: 2.46 L
FEV1FVC-%Change-Post: 0 %
FEV1FVC-%Pred-Pre: 106 %
FEV6-%Change-Post: -4 %
FEV6-%Pred-Post: 86 %
FEV6-%Pred-Pre: 90 %
FEV6-Post: 2.86 L
FEV6-Pre: 2.99 L
FEV6FVC-%Pred-Post: 104 %
FEV6FVC-%Pred-Pre: 104 %
FVC-%Change-Post: -4 %
FVC-%Pred-Post: 83 %
FVC-%Pred-Pre: 87 %
FVC-Post: 2.86 L
FVC-Pre: 2.99 L
Post FEV1/FVC ratio: 83 %
Post FEV6/FVC ratio: 100 %
Pre FEV1/FVC ratio: 82 %
Pre FEV6/FVC Ratio: 100 %

## 2016-04-23 MED ORDER — SODIUM CHLORIDE 0.9 % IN NEBU
3.0000 mL | INHALATION_SOLUTION | Freq: Once | RESPIRATORY_TRACT | Status: AC
  Administered 2016-04-23: 3 mL via RESPIRATORY_TRACT

## 2016-04-23 MED ORDER — METHACHOLINE 4 MG/ML NEB SOLN: 2.0000 mL | Freq: Once | RESPIRATORY_TRACT | Status: DC

## 2016-04-23 MED ORDER — ALBUTEROL SULFATE (2.5 MG/3ML) 0.083% IN NEBU
2.5000 mg | INHALATION_SOLUTION | Freq: Once | RESPIRATORY_TRACT | Status: AC
  Administered 2016-04-23: 2.5 mg via RESPIRATORY_TRACT

## 2016-04-23 MED ORDER — METHACHOLINE 0.0625 MG/ML NEB SOLN
2.0000 mL | Freq: Once | RESPIRATORY_TRACT | Status: AC
  Administered 2016-04-23: 0.125 mg via RESPIRATORY_TRACT

## 2016-04-23 MED ORDER — METHACHOLINE 1 MG/ML NEB SOLN
2.0000 mL | Freq: Once | RESPIRATORY_TRACT | Status: AC
  Administered 2016-04-23: 2 mg via RESPIRATORY_TRACT

## 2016-04-23 MED ORDER — METHACHOLINE 16 MG/ML NEB SOLN: 2.0000 mL | Freq: Once | RESPIRATORY_TRACT | Status: DC

## 2016-04-23 MED ORDER — METHACHOLINE 0.25 MG/ML NEB SOLN
2.0000 mL | Freq: Once | RESPIRATORY_TRACT | Status: AC
  Administered 2016-04-23: 0.5 mg via RESPIRATORY_TRACT

## 2016-04-28 ENCOUNTER — Other Ambulatory Visit: Payer: Self-pay | Admitting: Family Medicine

## 2016-04-29 ENCOUNTER — Other Ambulatory Visit: Payer: Self-pay | Admitting: Sports Medicine

## 2016-05-04 ENCOUNTER — Other Ambulatory Visit: Payer: Self-pay | Admitting: Family Medicine

## 2016-05-04 ENCOUNTER — Ambulatory Visit: Payer: Medicare Other | Admitting: Family Medicine

## 2016-05-04 ENCOUNTER — Ambulatory Visit (INDEPENDENT_AMBULATORY_CARE_PROVIDER_SITE_OTHER): Payer: Medicare Other | Admitting: Family Medicine

## 2016-05-04 ENCOUNTER — Telehealth: Payer: Self-pay | Admitting: Family Medicine

## 2016-05-04 ENCOUNTER — Ambulatory Visit: Payer: Medicare Other

## 2016-05-04 VITALS — BP 134/82 | HR 78 | Wt 176.0 lb

## 2016-05-04 DIAGNOSIS — E538 Deficiency of other specified B group vitamins: Secondary | ICD-10-CM | POA: Diagnosis not present

## 2016-05-04 DIAGNOSIS — IMO0001 Reserved for inherently not codable concepts without codable children: Secondary | ICD-10-CM

## 2016-05-04 DIAGNOSIS — Z8719 Personal history of other diseases of the digestive system: Secondary | ICD-10-CM | POA: Diagnosis not present

## 2016-05-04 DIAGNOSIS — R197 Diarrhea, unspecified: Secondary | ICD-10-CM

## 2016-05-04 MED ORDER — CYANOCOBALAMIN 1000 MCG/ML IJ SOLN
1000.0000 ug | Freq: Once | INTRAMUSCULAR | Status: AC
  Administered 2016-05-04: 1000 ug via INTRAMUSCULAR

## 2016-05-04 NOTE — Telephone Encounter (Signed)
Lab orders placed.  

## 2016-05-04 NOTE — Progress Notes (Signed)
Patient came into clinic today for monthly B12 injection. Pt denies any abnormal symptoms except for recent diarrhea (more detailed information in phone note). Pt tolerated B12 injection in [place]Q well, no immediate complications. Lab slip for stool collection given to Pt, instructed to visit the lab downstairs for specimen containers. No further questions/concerns.    Agree with above.  [name], MD

## 2016-05-04 NOTE — Telephone Encounter (Signed)
Pt called clinic stating she is having diarrhea on average "at least 7 times a day." Pt has had C-Diff in the past and would like to be checked for it again. Advised Pt I would route to PCP for order review, if appropriate she will need to get the collection containers from the lab in our building. Verbalized understanding.

## 2016-05-05 LAB — C. DIFFICILE GDH AND TOXIN A/B
C. difficile GDH: NOT DETECTED
C. difficile Toxin A/B: NOT DETECTED

## 2016-05-07 ENCOUNTER — Encounter: Payer: Self-pay | Admitting: Family Medicine

## 2016-05-08 LAB — STOOL CULTURE

## 2016-05-14 ENCOUNTER — Ambulatory Visit (INDEPENDENT_AMBULATORY_CARE_PROVIDER_SITE_OTHER): Payer: Medicare Other | Admitting: Neurology

## 2016-05-14 ENCOUNTER — Encounter: Payer: Self-pay | Admitting: Neurology

## 2016-05-14 ENCOUNTER — Ambulatory Visit
Admit: 2016-05-14 | Discharge: 2016-05-14 | Payer: PRIVATE HEALTH INSURANCE | Attending: Internal Medicine | Primary: Internal Medicine

## 2016-05-14 VITALS — BP 155/77 | HR 67 | Ht 66.0 in | Wt 176.2 lb

## 2016-05-14 DIAGNOSIS — R55 Syncope and collapse: Secondary | ICD-10-CM

## 2016-05-14 DIAGNOSIS — M797 Fibromyalgia: Secondary | ICD-10-CM | POA: Diagnosis not present

## 2016-05-14 DIAGNOSIS — G250 Essential tremor: Secondary | ICD-10-CM | POA: Diagnosis not present

## 2016-05-14 DIAGNOSIS — R413 Other amnesia: Secondary | ICD-10-CM | POA: Diagnosis not present

## 2016-05-14 DIAGNOSIS — R319 Hematuria, unspecified: Secondary | ICD-10-CM

## 2016-05-14 HISTORY — DX: Essential tremor: G25.0

## 2016-05-14 MED ORDER — MEMANTINE HCL 28 X 5 MG & 21 X 10 MG PO TABS: ORAL_TABLET | ORAL | Status: DC

## 2016-05-14 NOTE — Patient Instructions (Signed)
   If you do well on the Namenda titration pack, call our office for a daily prescription.

## 2016-05-14 NOTE — Progress Notes (Signed)
Reason for visit: Memory disorder  Mia [name] is an 65 y.o. female  History of present illness:  Mia [name] is a 65 year old right-handed white female with a history of a mild memory disturbance. The patient has also sustained a syncopal event, workup was unremarkable. The patient had been on metoprolol and Aricept, the Aricept dose was reduced as this combination could potentially cause significant bradycardia. The patient has not had any further episodes of syncope, she is back to driving at this point. The patient apparently has been placed on prednisone for an essential tremor affecting the hands and head and neck. The patient reports no recent falls, she does have some baseline gait instability. She continues to have some problems with word finding, short-term memory issues, occasionally she may leave on the stove or her curling iron. The patient returns to this office for an evaluation.  Past Medical History  Diagnosis Date  . SVT (supraventricular tachycardia) ([place])   . Sleep apnea     a. on cpap.  . Raynaud phenomenon   . Anemia   . Arthritis   . Blood transfusion   . Diabetes mellitus   . GERD (gastroesophageal reflux disease)     gastritis  . Hypertension   . Neuromuscular disorder ([place])     sclerosis  . Pseudogout   . Thyroid disease     hypothyroidism  . Hemorrhoids   . Rectal bleeding   . PONV (postoperative nausea and vomiting)   . Memory difficulty 12/14/2013  . Fibromyalgia   . Anal fissure   . Gall stones   . Interstitial cystitis   . Chronic Dyspnea     a. 01/2013 Echo: EF 60-65%, Gr 1 DD, PASP 59mmHg.  . Chest pain     a. 01/2013 MV: EF 59%, no ischemia.  . Peptic ulcer   . C. difficile colitis   . Osteoporosis   . Syncope 09/10/2015  . Tremor, essential 05/14/2016    Past Surgical History  Procedure Laterality Date  . Cholecystectomy    . Appendectomy    . Tonsillectomy    . Hysterectomy - unknown type    . Knee surgery      x6  .  Oophorectomy    . Dilation and curettage of uterus    . Breast biopsy    . Shoulder surgery      bilateral- bones spur  . Neck surgery      fusion  . Quadriceps repair Right   . Bladder surgery      x2  . Cataract extraction Bilateral   . Rectocele repair      x2  . Enterocele repair      x2  . Cardiac catheterization    . Eye surgery      retina  . Shoulder arthroscopy with subacromial decompression, rotator cuff repair and bicep tendon repair  10/06/2012    Procedure: SHOULDER ARTHROSCOPY WITH SUBACROMIAL DECOMPRESSION, ROTATOR CUFF REPAIR AND BICEP TENDON REPAIR;  Surgeon: Nita Sells, MD;  Location: [facility];  Service: Orthopedics;  Laterality: Right;  Arthroscopic  Repair  of  Subscapularis, Open Biceps Tenodesis  . Bladder surgery    . Total knee arthroplasty      Family History  Problem Relation Age of Onset  . Heart attack Mother   . Stroke Mother   . Diabetes Mother   . Heart disease Mother   . Colon polyps Mother   . Breast cancer Maternal Grandmother   .  Wilson's disease Maternal Grandmother   . Pancreatic cancer Maternal Grandfather   . Heart disease Father   . Heart attack Father   . Motor neuron disease Sister   . Stroke Sister   . Lupus Sister   . Dementia Sister   . Arthritis/Rheumatoid Sister   . Asthma Sister   . Colon polyps Sister   . Irritable bowel syndrome Sister     Social history:  reports that she has never smoked. She has never used smokeless tobacco. She reports that she does not drink alcohol or use illicit drugs.    Allergies  Allergen Reactions  . Myrbetriq  [Mirabegron] Other (See Comments)    SEVERE HEADACHE  . Percocet [Oxycodone-Acetaminophen] Nausea And Vomiting  . Celebrex [Celecoxib] Other (See Comments)    Other reaction(s): Other flushed  . Codeine Nausea And Vomiting  . Darvocet [Propoxyphene N-Acetaminophen] Nausea And Vomiting  . Erythromycin Nausea And Vomiting    Nausea and vomiting    . Hydrocodone Nausea And Vomiting  . Lyrica [Pregabalin] Swelling    Legs swelling   . Macrodantin [Nitrofurantoin] Nausea And Vomiting  . Toradol [Ketorolac Tromethamine] Nausea And Vomiting  . Tramadol Nausea And Vomiting  . Verapamil Nausea And Vomiting    REACTION: intolerance    Medications:  Prior to Admission medications   Medication Sig Start Date End Date Taking? Authorizing Provider  albuterol (PROVENTIL HFA;VENTOLIN HFA) 108 (90 Base) MCG/ACT inhaler Inhale 2 puffs into the lungs every 6 (six) hours as needed for wheezing or shortness of breath. 01/28/16  Yes Jade L Breeback, PA-C  alendronate (FOSAMAX) 70 MG tablet Take 1 tablet by mouth  every 7 days with a full  glass of water on an empty  stomach 03/16/16  Yes Hali Marry, MD  atorvastatin (LIPITOR) 40 MG tablet Take 1 tablet by mouth  daily 08/27/15  Yes Thayer Headings, MD  calcium carbonate (OS-CAL) 600 MG TABS Take 600 mg by mouth daily with breakfast.    Yes Historical Provider, MD  cetirizine (ZYRTEC) 10 MG tablet Take 10 mg by mouth as needed for allergies.   Yes Historical Provider, MD  donepezil (ARICEPT) 5 MG tablet Take 1 tablet (5 mg total) by mouth at bedtime. 11/15/15  Yes Kathrynn Ducking, MD  fluticasone [facility]) 50 MCG/ACT nasal spray Place into both nostrils as needed for allergies or rhinitis.   Yes Historical Provider, MD  furosemide (LASIX) 20 MG tablet Take 1 tablet by mouth  daily 08/27/15  Yes Thayer Headings, MD  gabapentin (NEURONTIN) 300 MG capsule Take 1 to 2 capsules by  mouth every night at  bedtime to two times daily 02/10/16  Yes Silverio Decamp, MD  glucose blood test strip Use to test blood sugar once daily. Dx:R73.01 01/06/16  Yes Hali Marry, MD  ipratropium (ATROVENT) 0.03 % nasal spray Place 2 sprays into both nostrils every 12 (twelve) hours. 07/17/15  Yes Hali Marry, MD  leflunomide (ARAVA) 20 MG tablet Take 1 tablet by mouth daily. 12/17/14  Yes Historical  Provider, MD  metFORMIN (GLUCOPHAGE) 500 MG tablet TAKE 1 TABLET BY MOUTH  TWICE DAILY WITH MEALS 04/29/16  Yes Hali Marry, MD  methocarbamol (ROBAXIN) 500 MG tablet Take 1 tablet (500 mg total) by mouth 3 (three) times daily. 01/01/16  Yes Silverio Decamp, MD  metoprolol (LOPRESSOR) 50 MG tablet Take 1 tablet by mouth two  times daily 08/27/15  Yes Thayer Headings, MD  NON  FORMULARY Place into the nose at bedtime. cpap machine   Yes Historical Provider, MD  pantoprazole (PROTONIX) 40 MG tablet Take 1 tablet by mouth two  times daily 08/27/15  Yes Thayer Headings, MD  primidone (MYSOLINE) 50 MG tablet Take 1 tablet by mouth at  bedtime 04/29/16  Yes Silverio Decamp, MD  propranolol (INDERAL) 10 MG tablet Take 10 mg by mouth 3 (three) times daily as needed.    Yes Historical Provider, MD  venlafaxine XR (EFFEXOR-XR) 75 MG 24 hr capsule Take 1 capsule by mouth  daily with breakfast 12/10/15  Yes Kathrynn Ducking, MD  vitamin D, CHOLECALCIFEROL, 400 UNITS tablet Take 400 Units by mouth daily.    Yes Historical Provider, MD    ROS:  Out of a complete 14 system review of symptoms, the patient complains only of the following symptoms, and all other reviewed systems are negative.  Memory loss Walking difficulty, neck pain  Blood pressure 155/77, pulse 67, height 5\' 6"  (1.676 m), weight 176 lb 4 oz (79.946 kg).  Physical Exam  General: The patient is alert and cooperative at the time of the examination.  Skin: No significant peripheral edema is noted.   Neurologic Exam  Mental status: The patient is alert and oriented x 3 at the time of the examination. The patient has apparent normal recent and remote memory, with an apparently normal attention span and concentration ability. Mini-Mental Status Examination done today shows a total score of 28/30. The patient is able to name 10 animals in 30 seconds.   Cranial nerves: Facial symmetry is present. Speech is normal, no aphasia or  dysarthria is noted. Extraocular movements are full. Visual fields are full.  Motor: The patient has good strength in all 4 extremities.  Sensory examination: Soft touch sensation is symmetric on the face, arms, and legs.  Coordination: The patient has good finger-nose-finger and heel-to-shin bilaterally.  Gait and station: The patient has a normal gait. Tandem gait is unsteady. Romberg is positive. At times, some of the gait and balance instability appears to be functional in nature, over dramatic. No drift is seen.  Reflexes: Deep tendon reflexes are symmetric.   Assessment/Plan:  1. Memory disturbance  2. Gait disturbance  3. History of syncope  4. Essential tremor  The patient overall is doing well at this time. We will add Namenda to the Aricept, continue to follow the memory. She will follow-up in about 6 months. She will call in one month if she does well on the Namenda titration pack for a maintenance prescription.  Jill Alexanders MD 05/14/2016 8:37 PM  [facility] [address] [place], [place] [address]  Phone [phone] Fax [phone]

## 2016-05-14 NOTE — Progress Notes (Signed)
Fax note dr Estanislado [facility] [place]

## 2016-05-14 NOTE — Progress Notes (Addendum)
65 y.o. WHITE OR CAUCASIAN female who presents for evaluation.    She had an episode of painless hematuria 03/28/16.  Did not think much of it, no sx c/w uti and no prior hx stones, kidney or bladder problems.  She decided to just watch and see how thing go.  On 05/10/16, she had another episode and decided to go to [facility].  No vaginal, rectal sx, the bleeding was bright red blood in nature, no clots.  No flank pain, n/d, dysuria.  UA+ for blood, no imaging done, urine cx came back mixed flora.  She was sent home on keflex and told to f/u w [place].  No more bleeding since then and she feels fine today.  No fhx urologic malignancy, she smoked in her 43s, averages about 4 glasses wine/week    Past Medical History:   Diagnosis Date   ??? Allergic rhinitis    ??? Anxiety state, unspecified    ??? Arthritis     Dr. Synetta Shadow, Dr. Marisa Hua   ??? Compression fx, thoracic spine [facility]) 2007    negative DEXA Dr. Marisa Hua   ??? Dyslipidemia    ??? Dyspepsia    ??? Frozen shoulder     right Dr. Wynonia Hazard MRI   ??? Multiple lung nodules     no change 10/06, 03/07, 03/08   ??? Overweight (BMI 25.0-29.9)    ??? Palpitations 2008    neg thallium 2008, nl holter 2008, echo nl lv/ef 65%/tr mr/dd/nl pasp   ??? Prediabetes    ??? Syncope     neurocardiogenic by tilt 1994   ??? Venous insufficiency    ??? Vitamin D deficiency      Past Surgical History:   Procedure Laterality Date   ??? ECHO 2D ADULT  12/04    normal with EF 70%   ??? HX COLONOSCOPY      Dr Rosendo Gros 2007 negative   ??? HX GYN      s/p BTL    ??? HX HEMORRHOIDECTOMY      Dr. Rosendo Gros 2007   ??? STRESS TEST THALLIUM STUDY      2005 neg; 1/17 negative ef 75%   ??? [place] ABD COMP  9/05    negative   ??? VAS CAROTID DUPLEX BILATERAL  2004    negative     Social History     Social History   ??? Marital status: MARRIED     Spouse name: N/A   ??? Number of children: 2   ??? 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 Smoking status: Never Smoker    ??? Smokeless tobacco: Not on file   ??? Alcohol use 2.4 oz/week     4 Glasses of wine per week   ??? Drug use: No   ??? Sexual activity: Not on file     Other Topics Concern   ??? Not on file     Social History Narrative     Current Outpatient Prescriptions   Medication Sig   ??? atorvastatin (LIPITOR) 40 mg tablet take 1 tablet by mouth once daily   ??? citalopram (CELEXA) 10 mg tablet take 1 tablet by mouth once daily   ??? lansoprazole (PREVACID) 30 mg capsule take 1 capsule by mouth once daily BEFORE BREAKFAST   ??? VITAMIN D2 50,000 unit capsule take 1 capsule by mouth every week   ??? omega-3 fatty acids (  FISH OIL) cap Take 2 Tabs by mouth daily.   ??? multivitamin (ONE A DAY) tablet Take 1 Tab by mouth daily.   ??? aspirin 81 mg chewable tablet Take 81 mg by mouth daily.     No current facility-administered medications for this visit.      Allergies   Allergen Reactions   ??? Fish Oil Nausea Only   ??? Relafen [Nabumetone] Nausea Only     Visit Vitals   ??? BP 106/78 (BP 1 Location: Right arm, BP Patient Position: Sitting)   ??? Pulse 77   ??? Temp 98.3 ??F (36.8 ??C) (Oral)   ??? Ht 5\' 4"  (1.626 m)   ??? Wt 171 lb 12.8 oz (77.9 kg)   ??? SpO2 99%   ??? BMI 29.49 kg/m2   A&O x3  Affect is appropriate.  Mood stable  No apparent distress  Anicteric, no JVD, adenopathy. Questionable goiter?  No carotid bruits or radiated murmur  Lungs clear to auscultation, no wheezes or rales  Heart showed regular rate and rhythm. No murmur, rubs, gallops  Abdomen soft nontender, no hepatosplenomegaly or masses.   Extremities without edema.  Pulses 1-2+ symmetrically    LABS from [facility]  Large bl, 10-15r. 3-5w, neg nit, tr le  Cx 50K mixed flora    Assessment and plan:  1. Painless hematuria.  Went into discussion w patient about possible etiologies.  She prefers [facility] facilities so will send to [facility]  2. R/O goiter.  [place] pending from 6/17 visit      Above conditions discussed at length and patient vocalized understanding.  All questions answered to patient satifaction

## 2016-05-14 NOTE — Progress Notes (Signed)
1. Have you been to the ER, urgent care clinic or hospitalized since your last visit? YES.     2. Have you seen or consulted any other health care providers outside of the [facility] since your last visit (Include any pap smears or colon screening)? YES . [facility]     Do you have an Advanced Directive? YES

## 2016-05-19 ENCOUNTER — Encounter: Payer: Self-pay | Admitting: Emergency Medicine

## 2016-05-19 DIAGNOSIS — N3281 Overactive bladder: Secondary | ICD-10-CM | POA: Diagnosis not present

## 2016-05-19 DIAGNOSIS — N3010 Interstitial cystitis (chronic) without hematuria: Secondary | ICD-10-CM | POA: Diagnosis not present

## 2016-05-19 DIAGNOSIS — N3941 Urge incontinence: Secondary | ICD-10-CM | POA: Diagnosis not present

## 2016-05-19 DIAGNOSIS — M545 Low back pain: Secondary | ICD-10-CM | POA: Diagnosis not present

## 2016-05-20 ENCOUNTER — Encounter: Payer: Self-pay | Admitting: Emergency Medicine

## 2016-05-20 ENCOUNTER — Ambulatory Visit (INDEPENDENT_AMBULATORY_CARE_PROVIDER_SITE_OTHER): Payer: Medicare Other | Admitting: Emergency Medicine

## 2016-05-20 ENCOUNTER — Inpatient Hospital Stay: Admit: 2016-05-20 | Payer: BLUE CROSS/BLUE SHIELD | Attending: Internal Medicine | Primary: Internal Medicine

## 2016-05-20 VITALS — BP 134/92 | HR 64 | Ht 66.0 in | Wt 176.0 lb

## 2016-05-20 DIAGNOSIS — R0609 Other forms of dyspnea: Secondary | ICD-10-CM | POA: Diagnosis not present

## 2016-05-20 DIAGNOSIS — J309 Allergic rhinitis, unspecified: Secondary | ICD-10-CM

## 2016-05-20 DIAGNOSIS — G4733 Obstructive sleep apnea (adult) (pediatric): Secondary | ICD-10-CM

## 2016-05-20 DIAGNOSIS — R0600 Dyspnea, unspecified: Secondary | ICD-10-CM

## 2016-05-20 DIAGNOSIS — E049 Nontoxic goiter, unspecified: Secondary | ICD-10-CM

## 2016-05-20 NOTE — Progress Notes (Signed)
History of Present Illness:  65 yo woman never smoker with hx paroxismal SVT, Holter showed correlation at the time with exertional dyspnea and lightheadedness. PFT, methacholine and CPEX were reassuring except exercise limited by tachycardia. Has undergone CPAP titration and needs 14cm H2O pressure   ROV 07/30/09 -- Not using nasal pillows, using the nasal mask. Has been switched to auto-titration device due to difficulty tolerating 14cm H2O. Has had better compliance with latest mask. Wears it every night, When she wakes up in the middle of the night she takes it off. Can't really tell any difference in how she feels during the day - does have less fibromyalgia pain since she's been using it.   January 10, 2010--Presents for acute office visit. Complains of occ pain in left upper back wrapping around to the left breast when she takes a deep breath x8weeks. Happens intermittently, sore to touch, pain is under shoulder blade w/ radiation around to rbs. Use muscle relaxer patch did not help, no other meds used. Denies chest pain, dyspnea, orthopnea, hemoptysis, fever, n/v/d, edema, headache,recent travel or antibiotics.   ROV 08/25/10 -- Hx OSA and HTN. Was started on diltiazem about 6 weeks ago. Began to develop B LE edema, L>R, about 2 weeks ago. US showed no DVT, CT scan chest done, no PE showed "? PNA" so treated with levaquin. She had absolutely no symptoms at that time. She has been having some exertional dyspnea, occasionally feels her tachycardia, not sure the two correlate. Tells me she hasn't worn her CPAP in about 4 months because her mask is hurting her face.   ROV 09/04/10 -- Returns to f/u her edema, exertional SOB. I performed 24h urine, TTE last time - both are reassuring. She has not been wearing her CPAP mask reliably, needs a new mask.   ROV 02/08/13 -- return visit with hx OSA (on CPAP), HTN, SVT, exertional dyspnea s/p extensive w/u (negative methacholine, reassuring CPST. She was seen  by Dr Acie Fredrickson 01/24/13 for similar sx, tachycardia. Underwent TTE and stress testing >> normal. Her HR and BP were high. She has been off of CPAP for two years. She started back 2 weeks ago. Just got a new full face mask > having leakage problem. [employer] is the company. She says she is on auto-set, not 14cm H2O.   ROV 06/25/14 -- follows up for chronic dyspnea (w/u as above), OSA. She has been having trouble with R knee pain, R great toe pain. She has has hx pseudogout. She has been wearing her CPAP very rarely. She has the most difficulty trying to go to sleep, often gets waked up by a leak. She had spirometry 06/20/14 >> normal AF's, ? Small airways responsiveness. Her methacholine has previously been negative.    ROV 09/18/14 -- follow up visit for OSA and dyspnea with reassuring spirometry. 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 She has gotten a new CPAP and new mask > has been wearing reliably. She was seen in cardiology 11/13 for continued dyspnea, was noted to have exertional tachycardia. He is planning to repeat a stress test in the future. She tells me that she has been wearing her C Pap reliably averaging about 6 hours a night. She feels that it is helped her and is having less daytime sleepiness. A recent download from her device from 10/24-11/22/15 showed that she used her device for greater than 4 hours 77% of the time.   ROV 3//9/17 -- follow-up visit for obstructive sleep apnea. She  has a CPAP download available between December 7 and March 6. This shows usage on 14% of days only.  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 She has been sleeping on couch, had a huge change in her sleep habits in the time reflected in the download above. She tells me also that she has had 2 syncopal episodes in November '16. Seen by neuro - no evidence seizures. Cardiology performed rhythm monitor that was reassuring.   ROV 04/14/16 -- patient has  a history of obstructive sleep apnea, working to achieve better compliance with CPAP. As noted she had been having difficulty using. We have a download available today wean May and June that showed 87% compliance with 87% usage greater than 4 hours. The only time she didn't use it was when she was at a conference out of town. She feels definite benefits. She was seen by PCP last month for possible asthma flare > treated w abx and pred and BD's. She feels benefit from the BD. Her spiro in 2015 had a BD response on the 25-75%, ? Mild asthma.   ROV 05/20/16 -- Follow-up visit for patient with a history of obstructive sleep apnea on CPAP. A compliance download is available today. This shows that she's used the device for greater than 4 hours on 87% of the nights. She is on AutoSet device between 5 and 20 cm water. There has been some question of possibility of obstructive disease contributing to Chronic dyspnea on exertion. Methacholine challenge was done on 04/23/16. This showed normal spirometry without any evidence for airways hyperresponsiveness on the methacholine portion of the test. She has been working on increasing her exercise. She feels that her energy is better w CPAP, less fatigue. More dreaming.     Vitals:   05/20/16 1354 05/20/16 1355  BP:  (!) 134/92  BP Location:  Right Arm  Cuff Size:  Normal  Pulse:  64  SpO2:  97%  Weight: 176 lb (79.8 kg)   Height: 5\' 6"  (1.676 m)    Gen: Pleasant, well-nourished, in no distress,  normal affect  ENT: No lesions,  mouth clear,  oropharynx clear, no postnasal drip  Neck: No JVD, no TMG, no carotid bruits  Lungs: No use of accessory muscles, no dullness to percussion, clear without rales or rhonch. Cardiovascular: RRR, heart sounds normal, no murmur or gallops, no peripheral edema  Musculoskeletal: No deformities, no cyanosis or clubbing  Neuro: alert, non focal  Skin: Warm, no lesions or rashes    Cardiac Stress Test 02/01/13: Impression   Exercise Capacity: Lexiscan with low level exercise.  BP Response: Normal blood pressure response.  Clinical Symptoms: There is dyspnea.  ECG Impression: No significant ST segment change suggestive of ischemia.  Comparison with Prior Nuclear Study: No significant change from previous study  Overall Impression: Normal stress nuclear study. No evidence of ischemia. Heart rate accelerated very rapidly on walking lexiscan protocol.  LV Ejection Fraction: 59%. LV Wall Motion: NL LV Function; NL Wall Motion  OSA (obstructive sleep apnea) Good compliance as documented by her download that I reviewed today. She is benefiting clinically. Better sleep and feels better during the day. We will continue her current CPAP as ordered  Allergic rhinitis Continue same regimen  DOE (dyspnea on exertion) Continues to have dyspnea on exertion. She has had a fairly extensive workup in the several years that I have known her with reassuring  pulmonary function testing, reassuring methacholine challenge, reassuring cardiac evaluation. There has been some discussion of neuromuscular weakness at some point in the past but she did not have restriction on pulmonary function testing. Also she's been evaluated by neurology mainly for memory disturbance as opposed to weakness. Given her continued concerns I will repeat her cardiopulmonary exercise test. I suspect that she is deconditioned and that there is no cardiac or pulmonary contribution. I like to prove This definitively and then we will defer any further testing.  Baltazar Apo, MD, PhD 05/20/2016, 4:18 PM [facility] [phone] or if no answer [phone]

## 2016-05-20 NOTE — Assessment & Plan Note (Signed)
Continue same regimen 

## 2016-05-20 NOTE — Patient Instructions (Addendum)
Your breathing testing was normal, without any evidence for asthma. We will not start any inhaled medications at this time  Please continue your CPAP as you are using it every night We will perform a cardiopulmonary exercise test Follow with Dr Lamonte Sakai in 2 months or sooner if you have any problems.

## 2016-05-20 NOTE — Assessment & Plan Note (Signed)
Continues to have dyspnea on exertion. She has had a fairly extensive workup in the several years that I have known her with reassuring pulmonary function testing, reassuring methacholine challenge, reassuring cardiac evaluation. There has been some discussion of neuromuscular weakness at some point in the past but she did not have restriction on pulmonary function testing. Also she's been evaluated by neurology mainly for memory disturbance as opposed to weakness. Given her continued concerns I will repeat her cardiopulmonary exercise test. I suspect that she is deconditioned and that there is no cardiac or pulmonary contribution. I like to prove This definitively and then we will defer any further testing.

## 2016-05-20 NOTE — Assessment & Plan Note (Signed)
Good compliance as documented by her download that I reviewed today. She is benefiting clinically. Better sleep and feels better during the day. We will continue her current CPAP as ordered

## 2016-05-21 NOTE — Telephone Encounter (Signed)
Pt has been referred by Dr Ky Barban for painless hematuria, but the pt stated she was told she might have bladder, and or kidney cancer. No imagining in CC. Pt also only wants to see Dr Boris Sharper in the [facility] office. Gave pt apt for 06/17/16 and then pt wanted to know if its okay to wait that long if they are not sure she has cancer. Can you help me with this ?//SDusza

## 2016-05-24 NOTE — Telephone Encounter (Signed)
pls call    Thyroid US shows left sided nodule  This was apparently seen on ct done 2008?(not available for me to review)  Most likely benign as no change in 9 years  Can do f/u US in 1 year if she wishes

## 2016-05-25 NOTE — Telephone Encounter (Signed)
Patient aware of message below, she verbalized understanding

## 2016-05-28 ENCOUNTER — Ambulatory Visit (HOSPITAL_COMMUNITY): Payer: Medicare Other | Attending: Emergency Medicine

## 2016-05-28 DIAGNOSIS — R0600 Dyspnea, unspecified: Secondary | ICD-10-CM

## 2016-05-28 DIAGNOSIS — R0609 Other forms of dyspnea: Secondary | ICD-10-CM | POA: Diagnosis not present

## 2016-05-29 NOTE — Telephone Encounter (Signed)
Called pt again no answer per Dr.Shah office patient c/x appointment w/Dr.Shah patient not ready to have study at this time patient will call when ready.

## 2016-06-01 ENCOUNTER — Ambulatory Visit: Payer: Medicare Other

## 2016-06-01 DIAGNOSIS — R0600 Dyspnea, unspecified: Secondary | ICD-10-CM | POA: Diagnosis not present

## 2016-06-02 ENCOUNTER — Ambulatory Visit (INDEPENDENT_AMBULATORY_CARE_PROVIDER_SITE_OTHER): Payer: Medicare Other | Admitting: Family Medicine

## 2016-06-02 VITALS — BP 106/70 | HR 104 | Wt 179.0 lb

## 2016-06-02 DIAGNOSIS — Z79899 Other long term (current) drug therapy: Secondary | ICD-10-CM | POA: Diagnosis not present

## 2016-06-02 DIAGNOSIS — E538 Deficiency of other specified B group vitamins: Secondary | ICD-10-CM

## 2016-06-02 MED ORDER — CYANOCOBALAMIN 1000 MCG/ML IJ SOLN
1000.0000 ug | Freq: Once | INTRAMUSCULAR | Status: AC
  Administered 2016-06-02: 1000 ug via INTRAMUSCULAR

## 2016-06-02 NOTE — Progress Notes (Signed)
Pt came into clinic today for monthly B12 injections. Pt reports no complications regarding injections. States she has been "more tired lately," reports she can "always tell when she's due for her next injection." Pt tolerated injection in LUOQ well, no immediate complications. Pt advised to schedule her next appointment in 4 weeks, verbalized understanding. No further questions.   Agree with above.  [name], MD

## 2016-06-11 DIAGNOSIS — L570 Actinic keratosis: Secondary | ICD-10-CM | POA: Diagnosis not present

## 2016-06-11 DIAGNOSIS — D692 Other nonthrombocytopenic purpura: Secondary | ICD-10-CM | POA: Diagnosis not present

## 2016-06-11 DIAGNOSIS — L820 Inflamed seborrheic keratosis: Secondary | ICD-10-CM | POA: Diagnosis not present

## 2016-06-12 ENCOUNTER — Encounter: Payer: Self-pay | Admitting: *Deleted

## 2016-06-12 ENCOUNTER — Emergency Department (INDEPENDENT_AMBULATORY_CARE_PROVIDER_SITE_OTHER)
Admission: EM | Admit: 2016-06-12 | Discharge: 2016-06-12 | Disposition: A | Discharge status: Home / Self Care | Payer: Medicare Other | Source: Home / Self Care | Attending: Family Medicine | Admitting: Family Medicine

## 2016-06-12 DIAGNOSIS — R51 Headache: Secondary | ICD-10-CM | POA: Diagnosis not present

## 2016-06-12 DIAGNOSIS — S0990XA Unspecified injury of head, initial encounter: Secondary | ICD-10-CM | POA: Diagnosis not present

## 2016-06-12 DIAGNOSIS — R519 Headache, unspecified: Secondary | ICD-10-CM

## 2016-06-12 MED ORDER — KETOROLAC TROMETHAMINE 60 MG/2ML IM SOLN
60.0000 mg | Freq: Once | INTRAMUSCULAR | Status: AC
  Administered 2016-06-12: 60 mg via INTRAMUSCULAR

## 2016-06-12 MED ORDER — ONDANSETRON 4 MG PO TBDP
4.0000 mg | ORAL_TABLET | Freq: Once | ORAL | Status: AC
  Administered 2016-06-12: 4 mg via ORAL

## 2016-06-12 MED ORDER — ONDANSETRON 4 MG PO TBDP: ORAL_TABLET | ORAL | 0 refills | Status: DC

## 2016-06-12 NOTE — ED Triage Notes (Signed)
Pt reports hitting her forehead on the threshold of a barn door 6 days ago. Denies loss of consciousness. That night she developed a frontal HA with nausea that has persisted ever since. Denies taking any anticoagulant medications. Taken tylenol and Excedrin with minimal relief.

## 2016-06-12 NOTE — Discharge Instructions (Signed)
May continue ice pack to forehead as needed.  May continue Tylenol or Excedrin Migraine tabs as needed. If symptoms become significantly worse during the night or over the weekend, proceed to the local emergency room.

## 2016-06-12 NOTE — ED Provider Notes (Signed)
[name]    CSN: [id number] Arrival date & time: 06/12/16  1609  First Provider Contact:  None       History   Chief Complaint Chief Complaint  Patient presents with  . Headache    HPI Mia [name] is a 65 y.o. female.   Patient states that 6 days ago while walking through the short door of a tobacco barn, she hit her forehead on the upper header.  She denies loss of consciousness, but has had a persistent dull frontal headache and nausea without vomiting.  She has had no swelling or bruising of her forehead.  She complains of persistent light sensitivity, but no other localizing neurologic symptoms.  Her headache does not awaken her, but is present when she awakens.  She has a history of ataxia and difficulty maintaining her balance with eyes closed, and there has been no change.  Her husband reports there has been no change in her behavior.   She has a distant past history of unilateral migraine headaches.   The history is provided by the patient and the spouse.  Head Injury  Location:  Frontal Time since incident:  6 days Mechanism of injury: self-inflicted   Pain details:    Quality:  Aching and dull   Severity:  Moderate   Duration:  6 days   Timing:  Constant   Progression:  Unchanged Chronicity:  New Relieved by:  Nothing Worsened by:  Light Ineffective treatments:  OTC medications Associated symptoms: headache and nausea   Associated symptoms: no blurred vision, no disorientation, no double vision, no focal weakness, no hearing loss, no loss of consciousness, no memory loss, no neck pain, no numbness, no seizures, no tinnitus and no vomiting     Past Medical History:  Diagnosis Date  . Anal fissure   . Anemia   . Arthritis   . Blood transfusion   . C. difficile colitis   . Chest pain    a. 01/2013 [facility]: EF 59%, no ischemia.  . Chronic Dyspnea    a. 01/2013 Echo: EF 60-65%, Gr 1 DD, PASP 70mmHg.  . Diabetes mellitus   . Fibromyalgia   . Gall  stones   . GERD (gastroesophageal reflux disease)    gastritis  . Hemorrhoids   . Hypertension   . Interstitial cystitis   . Memory difficulty 12/14/2013  . Neuromuscular disorder ([place])    sclerosis  . Osteoporosis   . Peptic ulcer   . PONV (postoperative nausea and vomiting)   . Pseudogout   . Raynaud phenomenon   . Rectal bleeding   . Sleep apnea    a. on cpap.  Marland Kitchen SVT (supraventricular tachycardia) ([place])   . Syncope 09/10/2015  . Thyroid disease    hypothyroidism  . Tremor, essential 05/14/2016    Patient Active Problem List   Diagnosis Date Noted  . Tremor, essential 05/14/2016  . Right foot pain 04/14/2016  . B12 deficiency 09/10/2015  . Syncope 09/10/2015  . Cervical facet joint syndrome 09/10/2015  . Chronic fatigue 09/10/2015  . Aortic atherosclerosis ([place]) 04/01/2015  . 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 Chronic Dyspnea   . Chest pain   . DOE (dyspnea on exertion)   . Benign head tremor 06/11/2014  . Low back pain 06/11/2014  . IFG (impaired fasting glucose) 03/09/2014  . Osteopenia 03/09/2014  . IC (interstitial cystitis) 03/09/2014  . Hemorrhoid 03/09/2014  . Retinal wrinkling, right eye 03/09/2014  . Fibromyalgia  03/09/2014  . GERD (gastroesophageal reflux disease) 03/09/2014  . History of arthroplasty of right knee 01/31/2014  . Memory difficulty 12/14/2013  . Hemorrhoids, external, thrombosed 06/22/2011  . Hypertension 03/11/2011  . SVT (supraventricular tachycardia) ([place])   . Raynaud phenomenon   . EDEMA 08/25/2010  . Nonspecific (abnormal) findings on radiological and other examination of body structure 08/25/2010  . COMPUTERIZED TOMOGRAPHY, CHEST, ABNORMAL 08/25/2010  . OSA (obstructive sleep apnea) 04/24/2009  . 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 Allergic rhinitis 06/11/2008  . Dyspnea 06/11/2008  . 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 Chronic diastolic CHF (congestive heart failure) ([place])  06/08/2008  . INTERSTITIAL CYSTITIS 06/08/2008    Past Surgical History:  Procedure Laterality Date  . APPENDECTOMY    . BLADDER SURGERY     x2  . BLADDER SURGERY    . BREAST BIOPSY    . CARDIAC CATHETERIZATION    . CATARACT EXTRACTION Bilateral   . CHOLECYSTECTOMY    . DILATION AND CURETTAGE OF UTERUS    . ENTEROCELE REPAIR     x2  . EYE SURGERY     retina  . hysterectomy - unknown type    . KNEE SURGERY     x6  . NECK SURGERY     fusion  . OOPHORECTOMY    . QUADRICEPS REPAIR Right   . RECTOCELE REPAIR     x2  . SHOULDER ARTHROSCOPY WITH SUBACROMIAL DECOMPRESSION, ROTATOR CUFF REPAIR AND BICEP TENDON REPAIR  10/06/2012   Procedure: SHOULDER ARTHROSCOPY WITH SUBACROMIAL DECOMPRESSION, ROTATOR CUFF REPAIR AND BICEP TENDON REPAIR;  Surgeon: Nita Sells, MD;  Location: [facility];  Service: Orthopedics;  Laterality: Right;  Arthroscopic  Repair  of  Subscapularis, Open Biceps Tenodesis  . SHOULDER SURGERY     bilateral- bones spur  . TONSILLECTOMY    . TOTAL KNEE ARTHROPLASTY      OB History    No data available       Home Medications    Prior to Admission medications   Medication Sig Start Date End Date Taking? Authorizing Provider  albuterol (PROVENTIL HFA;VENTOLIN HFA) 108 (90 Base) MCG/ACT inhaler Inhale 2 puffs into the lungs every 6 (six) hours as needed for wheezing or shortness of breath. 01/28/16   Jade L Breeback, PA-C  alendronate (FOSAMAX) 70 MG tablet Take 1 tablet by mouth  every 7 days with a full  glass of water on an empty  stomach 03/16/16   Hali Marry, MD  atorvastatin (LIPITOR) 40 MG tablet Take 1 tablet by mouth  daily 08/27/15   Thayer Headings, MD  calcium carbonate (OS-CAL) 600 MG TABS Take 600 mg by mouth daily with breakfast.     Historical Provider, MD  cetirizine (ZYRTEC) 10 MG tablet Take 10 mg by mouth as needed for allergies.    Historical Provider, MD  donepezil (ARICEPT) 5 MG tablet Take 1 tablet (5 mg  total) by mouth at bedtime. 11/15/15   Kathrynn Ducking, MD  fluticasone [facility]) 50 MCG/ACT nasal spray Place into both nostrils as needed for allergies or rhinitis.    Historical Provider, MD  furosemide (LASIX) 20 MG tablet Take 1 tablet by mouth  daily 08/27/15   Thayer Headings, MD  gabapentin (NEURONTIN) 300 MG capsule Take 1 to 2 capsules by  mouth every night at  bedtime to two times daily 02/10/16   Silverio Decamp, MD  glucose blood test strip Use to test blood sugar once daily.  Dx:R73.01 01/06/16   Hali Marry, MD  ipratropium (ATROVENT) 0.03 % nasal spray Place 2 sprays into both nostrils every 12 (twelve) hours. 07/17/15   Hali Marry, MD  leflunomide (ARAVA) 20 MG tablet Take 1 tablet by mouth daily. 12/17/14   Historical Provider, MD  metFORMIN (GLUCOPHAGE) 500 MG tablet TAKE 1 TABLET BY MOUTH  TWICE DAILY WITH MEALS 04/29/16   Hali Marry, MD  methocarbamol (ROBAXIN) 500 MG tablet Take 1 tablet (500 mg total) by mouth 3 (three) times daily. 01/01/16   Silverio Decamp, MD  metoprolol (LOPRESSOR) 50 MG tablet Take 1 tablet by mouth two  times daily 08/27/15   Thayer Headings, MD  NON FORMULARY Place into the nose at bedtime. cpap machine    Historical Provider, MD  ondansetron (ZOFRAN ODT) 4 MG disintegrating tablet Take one tab by mouth Q6hr prn nausea 06/12/16   Kandra Nicolas, MD  pantoprazole (PROTONIX) 40 MG tablet Take 1 tablet by mouth two  times daily 08/27/15   Thayer Headings, MD  primidone (MYSOLINE) 50 MG tablet Take 1 tablet by mouth at  bedtime 04/29/16   Silverio Decamp, MD  venlafaxine XR (EFFEXOR-XR) 75 MG 24 hr capsule Take 1 capsule by mouth  daily with breakfast 12/10/15   Kathrynn Ducking, MD  vitamin D, CHOLECALCIFEROL, 400 UNITS tablet Take 400 Units by mouth daily.     Historical Provider, MD    Family History Family History  Problem Relation Age of Onset  . Heart attack Mother   . Stroke Mother   . Diabetes Mother   . Heart  disease Mother   . Colon polyps Mother   . Breast cancer Maternal Grandmother   . Wilson's disease Maternal Grandmother   . Pancreatic cancer Maternal Grandfather   . Heart disease Father   . Heart attack Father   . Motor neuron disease Sister   . Stroke Sister   . Lupus Sister   . Dementia Sister   . Arthritis/Rheumatoid Sister   . Asthma Sister   . Colon polyps Sister   . Irritable bowel syndrome Sister     Social History Social History  Substance Use Topics  . Smoking status: Never Smoker  . Smokeless tobacco: Never Used  . Alcohol use No     Allergies   Myrbetriq  [mirabegron]; Percocet [oxycodone-acetaminophen]; Celebrex [celecoxib]; Codeine; Darvocet [propoxyphene n-acetaminophen]; Erythromycin; Hydrocodone; Lyrica [pregabalin]; Macrodantin [nitrofurantoin]; Toradol [ketorolac tromethamine]; Tramadol; and Verapamil   Review of Systems Review of Systems  HENT: Negative for hearing loss and tinnitus.   Eyes: Negative for blurred vision and double vision.  Gastrointestinal: Positive for nausea. Negative for vomiting.  Musculoskeletal: Negative for neck pain.  Neurological: Positive for headaches. Negative for focal weakness, seizures, loss of consciousness and numbness.  Psychiatric/Behavioral: Negative for memory loss.  All other systems reviewed and are negative.    Physical Exam Triage Vital Signs ED Triage Vitals  Enc Vitals Group     BP      Pulse      Resp      Temp      Temp src      SpO2      Weight      Height      Head Circumference      Peak Flow      Pain Score      Pain Loc      Pain Edu?      Excl. in  GC?    No data found.   Updated Vital Signs BP 141/83 (BP Location: Left Arm)   Pulse 92   Temp 97.7 F (36.5 C) (Oral)   Resp 14   Wt 174 lb (78.9 kg)   SpO2 96%   BMI 28.08 kg/m   Visual Acuity Right Eye Distance:   Left Eye Distance:   Bilateral Distance:    Right Eye Near:   Left Eye Near:    Bilateral Near:      Physical Exam  Constitutional: She is oriented to person, place, and time. She appears well-developed and well-nourished. No distress.  HENT:  Head: Normocephalic.    Right Ear: External ear normal.  Left Ear: External ear normal.  Nose: Nose normal.  Mouth/Throat: Oropharynx is clear and moist.  There is tenderness to palpation over forehead and eyebrows as noted on diagram.   No ecchymosis, swelling, or bony step-offs palpated.  Eyes: Conjunctivae and EOM are normal. Pupils are equal, round, and reactive to light.  Neck: Normal range of motion.  Cardiovascular: Normal heart sounds.   Pulmonary/Chest: Breath sounds normal.  Musculoskeletal: She exhibits no edema.  Neurological: She is alert and oriented to person, place, and time. She has normal strength and normal reflexes. She displays normal reflexes. No cranial nerve deficit or sensory deficit. She exhibits normal muscle tone.  Romberg test is positive. Patient has photophobia; unable to perform adequate eye exam.  Skin: Skin is dry.  Nursing note and vitals reviewed.    UC Treatments / Results  Labs (all labs ordered are listed, but only abnormal results are displayed) Labs Reviewed - No data to display  EKG  EKG Interpretation None       Radiology No results found.  Procedures Procedures (including critical care time)  Medications Ordered in UC Medications  ketorolac (TORADOL) injection 60 mg (60 mg Intramuscular Given 06/12/16 1630)  ondansetron (ZOFRAN-ODT) disintegrating tablet 4 mg (4 mg Oral Given 06/12/16 1631)   (patient notes that she has taken Toradol injection in the past without adverse effects)  Initial Impression / Assessment and Plan / UC Course  I have reviewed the triage vital signs and the nursing notes.  Pertinent labs & imaging results that were available during my care of the patient were reviewed by me and considered in my medical decision making (see chart for details).  Clinical  Course    Final Clinical Impressions(s) / UC Diagnoses   Final diagnoses:  Frontal headache; suspect concussion  Head injury due to trauma, initial encounter   May continue ice pack to forehead as needed.  May continue Tylenol or Excedrin Migraine tabs as needed. 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 If symptoms become significantly worse during the night or over the weekend, proceed to the local emergency room.  Recommend follow-up with PCP in 3 days  New Prescriptions Discharge Medication List as of 06/12/2016  5:04 PM    START taking these medications   Details  ondansetron (ZOFRAN ODT) 4 MG disintegrating tablet Take one tab by mouth Q6hr prn nausea, Print         Kandra Nicolas, MD 06/12/16 1746

## 2016-06-13 ENCOUNTER — Emergency Department (HOSPITAL_BASED_OUTPATIENT_CLINIC_OR_DEPARTMENT_OTHER): Payer: Medicare Other

## 2016-06-13 ENCOUNTER — Encounter (HOSPITAL_BASED_OUTPATIENT_CLINIC_OR_DEPARTMENT_OTHER): Payer: Self-pay | Admitting: Emergency Medicine

## 2016-06-13 ENCOUNTER — Emergency Department (HOSPITAL_BASED_OUTPATIENT_CLINIC_OR_DEPARTMENT_OTHER)
Admission: EM | Admit: 2016-06-13 | Discharge: 2016-06-13 | Disposition: A | Discharge status: Home / Self Care | Payer: Medicare Other | Attending: Emergency Medicine | Admitting: Emergency Medicine

## 2016-06-13 DIAGNOSIS — E119 Type 2 diabetes mellitus without complications: Secondary | ICD-10-CM | POA: Insufficient documentation

## 2016-06-13 DIAGNOSIS — W228XXA Striking against or struck by other objects, initial encounter: Secondary | ICD-10-CM | POA: Insufficient documentation

## 2016-06-13 DIAGNOSIS — Y929 Unspecified place or not applicable: Secondary | ICD-10-CM | POA: Insufficient documentation

## 2016-06-13 DIAGNOSIS — Y999 Unspecified external cause status: Secondary | ICD-10-CM | POA: Insufficient documentation

## 2016-06-13 DIAGNOSIS — S0990XA Unspecified injury of head, initial encounter: Secondary | ICD-10-CM | POA: Diagnosis not present

## 2016-06-13 DIAGNOSIS — Z7984 Long term (current) use of oral hypoglycemic drugs: Secondary | ICD-10-CM | POA: Insufficient documentation

## 2016-06-13 DIAGNOSIS — I5032 Chronic diastolic (congestive) heart failure: Secondary | ICD-10-CM | POA: Diagnosis not present

## 2016-06-13 DIAGNOSIS — G43809 Other migraine, not intractable, without status migrainosus: Secondary | ICD-10-CM

## 2016-06-13 DIAGNOSIS — Z79899 Other long term (current) drug therapy: Secondary | ICD-10-CM | POA: Diagnosis not present

## 2016-06-13 DIAGNOSIS — Y939 Activity, unspecified: Secondary | ICD-10-CM | POA: Diagnosis not present

## 2016-06-13 DIAGNOSIS — R51 Headache: Secondary | ICD-10-CM | POA: Diagnosis not present

## 2016-06-13 DIAGNOSIS — I110 Hypertensive heart disease with heart failure: Secondary | ICD-10-CM | POA: Insufficient documentation

## 2016-06-13 MED ORDER — SODIUM CHLORIDE 0.9 % IV BOLUS (SEPSIS)
1000.0000 mL | Freq: Once | INTRAVENOUS | Status: AC
Start: 2016-06-13 — End: 2016-06-13
  Administered 2016-06-13: 1000 mL via INTRAVENOUS

## 2016-06-13 MED ORDER — PROCHLORPERAZINE EDISYLATE 5 MG/ML IJ SOLN
10.0000 mg | Freq: Once | INTRAMUSCULAR | Status: AC
  Administered 2016-06-13: 10 mg via INTRAVENOUS
  Filled 2016-06-13: qty 2

## 2016-06-13 MED ORDER — DIPHENHYDRAMINE HCL 50 MG/ML IJ SOLN
12.5000 mg | Freq: Once | INTRAMUSCULAR | Status: AC
  Administered 2016-06-13: 12.5 mg via INTRAVENOUS
  Filled 2016-06-13: qty 1

## 2016-06-13 MED ORDER — DEXAMETHASONE SODIUM PHOSPHATE 10 MG/ML IJ SOLN
10.0000 mg | Freq: Once | INTRAMUSCULAR | Status: AC
  Administered 2016-06-13: 10 mg via INTRAVENOUS
  Filled 2016-06-13: qty 1

## 2016-06-13 MED ORDER — FENTANYL CITRATE (PF) 100 MCG/2ML IJ SOLN
25.0000 ug | Freq: Once | INTRAMUSCULAR | Status: AC
  Administered 2016-06-13: 25 ug via INTRAVENOUS
  Filled 2016-06-13: qty 2

## 2016-06-13 NOTE — ED Triage Notes (Signed)
Pt reports hitting her forehead on the threshold of a barn door 6 days ago. Denies loss of consciousness. That night she developed a frontal HA with nausea that has persisted ever since. Denies taking any anticoagulant medications. Taken tylenol and Excedrin with minimal relief.  She went to [employer] yesterday and was sent home with information to come back if she is worse. Patient reports that her pain and nausea is worse

## 2016-06-13 NOTE — ED Provider Notes (Signed)
[facility] DEPT Provider Note   CSN: [id number] Arrival date & time: 06/13/16  1704  By signing my name below, I, Shanna Cisco, attest that this documentation has been prepared under the direction and in the presence of Leonard Schwartz, MD. Electronically Signed: Shanna Cisco, ED Scribe. 06/13/16. 5:37 PM.   History   Chief Complaint Chief Complaint  Patient presents with  . Head Injury    The history is provided by the patient. No language interpreter was used.   HPI Comments:  Mia [name] is a 65 y.o. female who presents to the Emergency Department complaining of head injury, which occurred  6 days ago. Pt reports that she hit her forehead on the threshold of a barn door and experienced nausea, headache and an episode of emesis that night. The next day she experienced pain in the back of eyes and along the eyebrows. Associated symptoms include light sensitivity and episodes of emesis this morning. Pt was evaluated by Urgent Care, who suspected a mild concussion. Pt has taken Excedrin, Aleve and Tylenol, with little relief. Denies loss of consciousness or neck pain. No pertinent past medical history.   Past Medical History:  Diagnosis Date  . Anal fissure   . Anemia   . Arthritis   . Blood transfusion   . C. difficile colitis   . Chest pain    a. 01/2013 MV: EF 59%, no ischemia.  . Chronic Dyspnea    a. 01/2013 Echo: EF 60-65%, Gr 1 DD, PASP 7mmHg.  . Diabetes mellitus   . Fibromyalgia   . Gall stones   . GERD (gastroesophageal reflux disease)    gastritis  . Hemorrhoids   . Hypertension   . Interstitial cystitis   . Memory difficulty 12/14/2013  . Neuromuscular disorder ([place])    sclerosis  . Osteoporosis   . Peptic ulcer   . PONV (postoperative nausea and vomiting)   . Pseudogout   . Raynaud phenomenon   . Rectal bleeding   . Sleep apnea    a. on cpap.  Marland Kitchen SVT (supraventricular tachycardia) ([place])   . Syncope 09/10/2015  . Thyroid disease    hypothyroidism  .  Tremor, essential 05/14/2016    Patient Active Problem List   Diagnosis Date Noted  . Tremor, essential 05/14/2016  . Right foot pain 04/14/2016  . B12 deficiency 09/10/2015  . Syncope 09/10/2015  . Cervical facet joint syndrome 09/10/2015  . Chronic fatigue 09/10/2015  . Aortic atherosclerosis ([place]) 04/01/2015  . 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 Chronic Dyspnea   . Chest pain   . DOE (dyspnea on exertion)   . Benign head tremor 06/11/2014  . Low back pain 06/11/2014  . IFG (impaired fasting glucose) 03/09/2014  . Osteopenia 03/09/2014  . IC (interstitial cystitis) 03/09/2014  . Hemorrhoid 03/09/2014  . Retinal wrinkling, right eye 03/09/2014  . Fibromyalgia 03/09/2014  . GERD (gastroesophageal reflux disease) 03/09/2014  . History of arthroplasty of right knee 01/31/2014  . Memory difficulty 12/14/2013  . Hemorrhoids, external, thrombosed 06/22/2011  . Hypertension 03/11/2011  . SVT (supraventricular tachycardia) ([place])   . Raynaud phenomenon   . EDEMA 08/25/2010  . Nonspecific (abnormal) findings on radiological and other examination of body structure 08/25/2010  . COMPUTERIZED TOMOGRAPHY, CHEST, ABNORMAL 08/25/2010  . OSA (obstructive sleep apnea) 04/24/2009  . 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 Allergic rhinitis 06/11/2008  . Dyspnea 06/11/2008  . 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 Chronic diastolic CHF (congestive heart failure) (  [place]) 06/08/2008  . INTERSTITIAL CYSTITIS 06/08/2008    Past Surgical History:  Procedure Laterality Date  . APPENDECTOMY    . BLADDER SURGERY     x2  . BLADDER SURGERY    . BREAST BIOPSY    . CARDIAC CATHETERIZATION    . CATARACT EXTRACTION Bilateral   . CHOLECYSTECTOMY    . DILATION AND CURETTAGE OF UTERUS    . ENTEROCELE REPAIR     x2  . EYE SURGERY     retina  . hysterectomy - unknown type    . KNEE SURGERY     x6  . NECK SURGERY     fusion  . OOPHORECTOMY    .  QUADRICEPS REPAIR Right   . RECTOCELE REPAIR     x2  . SHOULDER ARTHROSCOPY WITH SUBACROMIAL DECOMPRESSION, ROTATOR CUFF REPAIR AND BICEP TENDON REPAIR  10/06/2012   Procedure: SHOULDER ARTHROSCOPY WITH SUBACROMIAL DECOMPRESSION, ROTATOR CUFF REPAIR AND BICEP TENDON REPAIR;  Surgeon: Nita Sells, MD;  Location: [facility];  Service: Orthopedics;  Laterality: Right;  Arthroscopic  Repair  of  Subscapularis, Open Biceps Tenodesis  . SHOULDER SURGERY     bilateral- bones spur  . TONSILLECTOMY    . TOTAL KNEE ARTHROPLASTY      OB History    No data available       Home Medications    Prior to Admission medications   Medication Sig Start Date End Date Taking? Authorizing Provider  albuterol (PROVENTIL HFA;VENTOLIN HFA) 108 (90 Base) MCG/ACT inhaler Inhale 2 puffs into the lungs every 6 (six) hours as needed for wheezing or shortness of breath. 01/28/16   Jade L Breeback, PA-C  alendronate (FOSAMAX) 70 MG tablet Take 1 tablet by mouth  every 7 days with a full  glass of water on an empty  stomach 03/16/16   Hali Marry, MD  atorvastatin (LIPITOR) 40 MG tablet Take 1 tablet by mouth  daily 08/27/15   Thayer Headings, MD  calcium carbonate (OS-CAL) 600 MG TABS Take 600 mg by mouth daily with breakfast.     Historical Provider, MD  cetirizine (ZYRTEC) 10 MG tablet Take 10 mg by mouth as needed for allergies.    Historical Provider, MD  donepezil (ARICEPT) 5 MG tablet Take 1 tablet (5 mg total) by mouth at bedtime. 11/15/15   Kathrynn Ducking, MD  fluticasone [facility]) 50 MCG/ACT nasal spray Place into both nostrils as needed for allergies or rhinitis.    Historical Provider, MD  furosemide (LASIX) 20 MG tablet Take 1 tablet by mouth  daily 08/27/15   Thayer Headings, MD  gabapentin (NEURONTIN) 300 MG capsule Take 1 to 2 capsules by  mouth every night at  bedtime to two times daily 02/10/16   Silverio Decamp, MD  glucose blood test strip Use to test blood sugar  once daily. Dx:R73.01 01/06/16   Hali Marry, MD  ipratropium (ATROVENT) 0.03 % nasal spray Place 2 sprays into both nostrils every 12 (twelve) hours. 07/17/15   Hali Marry, MD  leflunomide (ARAVA) 20 MG tablet Take 1 tablet by mouth daily. 12/17/14   Historical Provider, MD  metFORMIN (GLUCOPHAGE) 500 MG tablet TAKE 1 TABLET BY MOUTH  TWICE DAILY WITH MEALS 04/29/16   Hali Marry, MD  methocarbamol (ROBAXIN) 500 MG tablet Take 1 tablet (500 mg total) by mouth 3 (three) times daily. 01/01/16   Silverio Decamp, MD  metoprolol (LOPRESSOR) 50 MG tablet Take  1 tablet by mouth two  times daily 08/27/15   Thayer Headings, MD  NON FORMULARY Place into the nose at bedtime. cpap machine    Historical Provider, MD  ondansetron (ZOFRAN ODT) 4 MG disintegrating tablet Take one tab by mouth Q6hr prn nausea 06/12/16   Kandra Nicolas, MD  pantoprazole (PROTONIX) 40 MG tablet Take 1 tablet by mouth two  times daily 08/27/15   Thayer Headings, MD  primidone (MYSOLINE) 50 MG tablet Take 1 tablet by mouth at  bedtime 04/29/16   Silverio Decamp, MD  venlafaxine XR (EFFEXOR-XR) 75 MG 24 hr capsule Take 1 capsule by mouth  daily with breakfast 12/10/15   Kathrynn Ducking, MD  vitamin D, CHOLECALCIFEROL, 400 UNITS tablet Take 400 Units by mouth daily.     Historical Provider, MD    Family History Family History  Problem Relation Age of Onset  . Heart attack Mother   . Stroke Mother   . Diabetes Mother   . Heart disease Mother   . Colon polyps Mother   . Breast cancer Maternal Grandmother   . Wilson's disease Maternal Grandmother   . Pancreatic cancer Maternal Grandfather   . Heart disease Father   . Heart attack Father   . Motor neuron disease Sister   . Stroke Sister   . Lupus Sister   . Dementia Sister   . Arthritis/Rheumatoid Sister   . Asthma Sister   . Colon polyps Sister   . Irritable bowel syndrome Sister     Social History Social History  Substance Use Topics  .  Smoking status: Never Smoker  . Smokeless tobacco: Never Used  . Alcohol use No     Allergies   Myrbetriq  [mirabegron]; Percocet [oxycodone-acetaminophen]; Celebrex [celecoxib]; Codeine; Darvocet [propoxyphene n-acetaminophen]; Erythromycin; Hydrocodone; Lyrica [pregabalin]; Macrodantin [nitrofurantoin]; Toradol [ketorolac tromethamine]; Tramadol; and Verapamil   Review of Systems Review of Systems  Eyes:       Light sensitivity.  Gastrointestinal: Positive for nausea and vomiting.  Musculoskeletal: Negative for neck pain.  Neurological: Positive for headaches. Negative for syncope.     Physical Exam Updated Vital Signs BP 146/84 (BP Location: Right Arm)   Pulse 60   Temp 98.1 F (36.7 C) (Oral)   Resp 16   Ht 5\' 6"  (1.676 m)   Wt 172 lb (78 kg)   SpO2 96%   BMI 27.76 kg/m   Physical Exam Physical Exam  Nursing note and vitals reviewed. Constitutional: She is oriented to person, place, and time. She appears well-developed and well-nourished. No distress.  HENT:  Head: Normocephalic and atraumatic.  Eyes: Pupils are equal, round, and reactive to light.  Neck: Normal range of motion.  no cervical spine tenderness. Cardiovascular: Normal rate and intact distal pulses.   Pulmonary/Chest: No respiratory distress.  Abdominal: Normal appearance. She exhibits no distension.  Musculoskeletal: Normal range of motion.  Neurological: She is alert and oriented to person, place, and time. No cranial nerve deficit.  Skin: Skin is warm and dry. No rash noted.  Psychiatric: She has a normal mood and affect. Her behavior is normal.    ED Treatments / Results  DIAGNOSTIC STUDIES:  Oxygen Saturation is 99% on room air, normal by my interpretation.    COORDINATION OF CARE:  5:25 PM Discussed treatment plan with pt at bedside, which includes CT scan, and pt agreed to plan.  Labs (all labs ordered are listed, but only abnormal results are displayed) Labs Reviewed - No  data to  display  EKG  EKG Interpretation None       Radiology No results found. CT HEAD WO CONTRAST (Final result)  Result time 06/13/16 17:56:37  Final result by Nelson Chimes, MD (06/13/16 17:56:37)           Narrative:   CLINICAL DATA: Trauma to the head, striking beam 1 week ago. Nausea and vomiting with headache since then.  EXAM: CT HEAD WITHOUT CONTRAST  TECHNIQUE: Contiguous axial images were obtained from the base of the skull through the vertex without intravenous contrast.  COMPARISON: MRI 09/23/2015  FINDINGS: Brain shows mild generalized atrophy. There is no all old or acute small or large vessel infarction, mass lesion, hemorrhage, hydrocephalus or extra-axial collection. No skull fracture. No fluid in the sinuses, middle ears or mastoids.  IMPRESSION: No acute or traumatic finding. Mild age related volume loss. No cause of the presenting symptoms is identified.   Electronically Signed By: Nelson Chimes M.D. On: 06/13/2016 17:56          Procedures Procedures (including critical care time)  Medications Ordered in ED Medications  prochlorperazine (COMPAZINE) injection 10 mg (10 mg Intravenous Given 06/13/16 1834)  diphenhydrAMINE (BENADRYL) injection 12.5 mg (12.5 mg Intravenous Given 06/13/16 1835)  fentaNYL (SUBLIMAZE) injection 25 mcg (25 mcg Intravenous Given 06/13/16 1836)  sodium chloride 0.9 % bolus 1,000 mL (0 mLs Intravenous Stopped 06/13/16 2021)  dexamethasone (DECADRON) injection 10 mg (10 mg Intravenous Given 06/13/16 2021)     Initial Impression / Assessment and Plan / ED Course  I have reviewed the triage vital signs and the nursing notes.  Pertinent labs & imaging results that were available during my care of the patient were reviewed by me and considered in my medical decision making (see chart for details).  Clinical Course      Final Clinical Impressions(s) / ED Diagnoses   Final diagnoses:  Head injury, initial  encounter  Other migraine without status migrainosus, not intractable    New Prescriptions Discharge Medication List as of 06/13/2016  8:25 PM    I personally performed the services described in this documentation, which was scribed in my presence. The recorded information has been reviewed and considered.     Leonard Schwartz, MD 06/17/16 1407

## 2016-06-13 NOTE — ED Notes (Signed)
MD at bedside. 

## 2016-06-16 ENCOUNTER — Telehealth: Payer: Self-pay | Admitting: *Deleted

## 2016-06-16 NOTE — Telephone Encounter (Signed)
Callback: Pt reports she is much improved. She had gone to ER the next day, CT head negative, was given fluids and more injection which resolved the severe pain.

## 2016-06-17 ENCOUNTER — Ambulatory Visit
Admit: 2016-06-17 | Discharge: 2016-06-17 | Payer: PRIVATE HEALTH INSURANCE | Attending: Urology | Primary: Internal Medicine

## 2016-06-17 DIAGNOSIS — R319 Hematuria, unspecified: Secondary | ICD-10-CM

## 2016-06-17 LAB — AMB POC URINALYSIS DIP STICK AUTO W/O MICRO
Bilirubin (UA POC): NEGATIVE
Glucose (UA POC): NEGATIVE
Ketones (UA POC): NEGATIVE
Nitrites (UA POC): NEGATIVE
Protein (UA POC): NEGATIVE mg/dL
Specific gravity (UA POC): 1.02 (ref 1.001–1.035)
Urobilinogen (UA POC): 0.2 (ref 0.2–1)
pH (UA POC): 5.5 (ref 4.6–8.0)

## 2016-06-17 NOTE — Progress Notes (Signed)
Urology Progress Note    Laura Roman   06/17/2016      ASSESSMENT:     Painless Gross Hematuria x2 occurring in June and July 2017    Current Disease Status:  Gross hematuria x2  Current Treatment Plan:Hematuria w/u    PLAN:     -Urine Cytology today  -CTU ([facility] or [facility])  -RTC for Cysto and to review imaging    Gross Hematuria Discussion:  We discussed the finding of hematuria and need for work up to rule out significant pathology. Work up should include cystoscopy, upper tract imaging, and cytology.  Patients who present with gross hematuria have an approximate 23% chance of having a urologic malignancy as the cause. In contradistinction, patients who present with microscopic hematuria have an approximate 5% chance of uncovering a urologic malignancy. Patients with gross hematuria and a negative work up have around an 18% chance of missed malignancy and therefore, I recommend some form of follow up for these individuals. Approximately 43% of patients who undergo work up for microscopic hematuria have a non-diagnostic work up. Fortunately, only a very small percentage of these patients have a missed malignancy and therefore, the follow-up can be limited.     SUBJECTIVE:     Chief Complaint   Patient presents with   ??? Gross Hematuria       HPI:  Laura Roman is a 65 y.o. female who was referred by Dr. Ky Barban for evaluation of Gross Hematuria.    Pt presents today c/o painless, bright red gross hematuria x2 occurring in June and July 2017. Denies associated dysuria, n/v/f/c, history of kidney stones, recurrent UTIs, or history of tobacco use. Urine Cx from 05/10/16 showed 50,000 col/mL, more than 2 different organisms. She has not had prior imaging. No prior hematuria workup. Regarding her voiding, Pt reports occasional SUI upon standing.    ECOG PS: 0    Review of Systems  Constitutional: Fever: No  Skin: Rash: No  HEENT: Hearing difficulty: No  Eyes: Blurred vision: No  Cardiovascular: Chest pain: No   Respiratory: Shortness of breath: No  Gastrointestinal: Nausea/vomiting: No  Musculoskeletal: Back pain: No  Neurological: Weakness: No  Psychological: Memory loss: No  Comments/additional findings:     Past Medical History:   Diagnosis Date   ??? Allergic rhinitis    ??? Anxiety state, unspecified    ??? Arthritis     Dr. Synetta Shadow, Dr. Marisa Hua   ??? Compression fx, thoracic spine [facility]) 2007    negative DEXA Dr. Marisa Hua   ??? Dyslipidemia    ??? Dyspepsia    ??? Frozen shoulder     right Dr. Wynonia Hazard MRI   ??? Left thyroid nodule 04/2016    1cm nodule; apparently noted on CT 2008 and unchanged   ??? Multiple lung nodules     no change 10/06, 03/07, 03/08   ??? Overweight (BMI 25.0-29.9)    ??? Palpitations 2008    neg thallium 2008, nl holter 2008, echo nl lv/ef 65%/tr mr/dd/nl pasp   ??? Prediabetes    ??? Syncope     neurocardiogenic by tilt 1994   ??? Venous insufficiency    ??? Vitamin D deficiency        Past Surgical History:   Procedure Laterality Date   ??? ECHO 2D ADULT  12/04    normal with EF 70%   ??? HX COLONOSCOPY      Dr Rosendo Gros 2007 negative   ??? HX GYN  s/p BTL    ??? HX HEMORRHOIDECTOMY      Dr. Rosendo Gros 2007   ??? STRESS TEST THALLIUM STUDY      2005 neg; 1/17 negative ef 75%   ??? [place] ABD COMP  9/05    negative   ??? VAS CAROTID DUPLEX BILATERAL  2004    negative       Allergies   Allergen Reactions   ??? Fish Oil Nausea Only   ??? Relafen [Nabumetone] Nausea Only       Current Outpatient Prescriptions   Medication Sig   ??? atorvastatin (LIPITOR) 40 mg tablet take 1 tablet by mouth once daily   ??? citalopram (CELEXA) 10 mg tablet take 1 tablet by mouth once daily   ??? lansoprazole (PREVACID) 30 mg capsule take 1 capsule by mouth once daily BEFORE BREAKFAST   ??? VITAMIN D2 50,000 unit capsule take 1 capsule by mouth every week   ??? omega-3 fatty acids (FISH OIL) cap Take 2 Tabs by mouth daily.   ??? multivitamin (ONE A DAY) tablet Take 1 Tab by mouth daily.   ??? aspirin 81 mg chewable tablet Take 81 mg by mouth daily.      No current facility-administered medications for this visit.      I have reviewed the family history and there are no changes at this time.    Family History   Problem Relation Age of Onset   ??? Diabetes Mother    ??? Elevated Lipids Father    ??? Stroke Paternal Grandmother    ??? Heart Disease Paternal Grandfather        Social History     Social History   ??? Marital status: MARRIED     Spouse name: N/A   ??? Number of children: 2   ??? 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 Smoking status: Never Smoker   ??? Smokeless tobacco: None   ??? Alcohol use 2.4 oz/week     4 Glasses of wine per week   ??? Drug use: No   ??? Sexual activity: Not Asked     Other Topics Concern   ??? None     Social History Narrative         OBJECTIVE:     Physical Exam:  Visit Vitals   ??? BP 124/72   ??? Ht 5\' 4"  (1.626 m)   ??? Wt 166 lb (75.3 kg)   ??? BMI 28.49 kg/m2     Gen:  WDWN WM, A&Ox3, NAD  Pulm: CTA B, no r/r/w  CV:  normal rate, regular rhythm, normal S1, S2, no murmurs, rubs, clicks or gallops.  Abd: non-distended; soft. NABS. No HSM  GU: no costovertebral angle or suprapubic tenderness   Ext: No edema; FROM all 4 extremities   Neuro: Grossly intact   Skin: warm and dry     LABS/ IMAGING:     Results for orders placed or performed in visit on 06/17/16   AMB POC URINALYSIS DIP STICK AUTO W/O MICRO   Result Value Ref Range    Color (UA POC) Yellow     Clarity (UA POC) Clear     Glucose (UA POC) Negative Negative    Bilirubin (UA POC) Negative Negative    Ketones (UA POC) Negative Negative    Specific gravity (UA POC) 1.020 1.001 - 1.035    Blood (UA POC)  1+ Negative    pH (UA POC) 5.5 4.6 - 8.0    Protein (UA POC) Negative Negative mg/dL    Urobilinogen (UA POC) 0.2 mg/dL 0.2 - 1    Nitrites (UA POC) Negative Negative    Leukocyte esterase (UA POC) 1+ Negative             Kojo Liby B. Jimmye Norman, MD, [place]   Urologic Oncology  [facility] [place]    Associate Professor of Urology   Department of Urology  [facility], [place]     Pager:  [phone]  Office:  [phone]        ICD-10-CM ICD-9-CM    1. Hematuria R31.9 599.70 CT UROGRAM W WO CONT      URINE CYTOLOGY      AMB POC URINALYSIS DIP STICK AUTO W/O MICRO       A copy of today's office visit with all pertinent imaging results and labs were sent to Albin Felling, MD for review.    Documentation provided by Augusto Garbe, medical scribe for Dr. Dulcy Fanny.

## 2016-06-18 LAB — URINE CYTOLOGY

## 2016-06-20 ENCOUNTER — Encounter (HOSPITAL_BASED_OUTPATIENT_CLINIC_OR_DEPARTMENT_OTHER): Payer: Self-pay | Admitting: Emergency Medicine

## 2016-06-20 ENCOUNTER — Emergency Department (HOSPITAL_BASED_OUTPATIENT_CLINIC_OR_DEPARTMENT_OTHER)
Admission: EM | Admit: 2016-06-20 | Discharge: 2016-06-20 | Disposition: A | Discharge status: Home / Self Care | Payer: Medicare Other | Attending: Emergency Medicine | Admitting: Emergency Medicine

## 2016-06-20 ENCOUNTER — Emergency Department (HOSPITAL_BASED_OUTPATIENT_CLINIC_OR_DEPARTMENT_OTHER): Payer: Medicare Other

## 2016-06-20 DIAGNOSIS — E119 Type 2 diabetes mellitus without complications: Secondary | ICD-10-CM | POA: Insufficient documentation

## 2016-06-20 DIAGNOSIS — Z7984 Long term (current) use of oral hypoglycemic drugs: Secondary | ICD-10-CM | POA: Insufficient documentation

## 2016-06-20 DIAGNOSIS — I10 Essential (primary) hypertension: Secondary | ICD-10-CM | POA: Insufficient documentation

## 2016-06-20 DIAGNOSIS — M5432 Sciatica, left side: Secondary | ICD-10-CM

## 2016-06-20 DIAGNOSIS — M549 Dorsalgia, unspecified: Secondary | ICD-10-CM | POA: Diagnosis present

## 2016-06-20 DIAGNOSIS — Z79899 Other long term (current) drug therapy: Secondary | ICD-10-CM | POA: Diagnosis not present

## 2016-06-20 DIAGNOSIS — M47816 Spondylosis without myelopathy or radiculopathy, lumbar region: Secondary | ICD-10-CM | POA: Diagnosis not present

## 2016-06-20 LAB — URINALYSIS, ROUTINE W REFLEX MICROSCOPIC
Bilirubin Urine: NEGATIVE
Glucose, UA: NEGATIVE mg/dL
Hgb urine dipstick: NEGATIVE
Ketones, ur: NEGATIVE mg/dL
Leukocytes, UA: NEGATIVE
Nitrite: NEGATIVE
Protein, ur: NEGATIVE mg/dL
Specific Gravity, Urine: 1.002 — ABNORMAL LOW (ref 1.005–1.030)
pH: 6 (ref 5.0–8.0)

## 2016-06-20 MED ORDER — PREDNISONE 20 MG PO TABS: 40.0000 mg | ORAL_TABLET | Freq: Every day | ORAL | 0 refills | Status: DC

## 2016-06-20 MED ORDER — ACETAMINOPHEN 500 MG PO TABS: 1000.0000 mg | ORAL_TABLET | Freq: Once | ORAL | Status: DC

## 2016-06-20 NOTE — ED Triage Notes (Signed)
Pt states left buttock pain since yesterday, pain radiates down left outer thigh, worse with movement.  Pt denies injury.  No numbness or tingling but an increase in urinary incontinence.

## 2016-06-20 NOTE — ED Provider Notes (Signed)
[facility] DEPT MHP Provider Note   CSN: [id number] Arrival date & time: 06/20/16  1413  By signing my name below, I, Mia [name], attest that this documentation has been prepared under the direction and in the presence of Sharlett Iles, MD . Electronically Signed: Estanislado [name], Scribe. 06/20/2016. 12:23 AM.  History   Chief Complaint Chief Complaint  Patient presents with  . Back Pain    The history is provided by the patient. No language interpreter was used.   HPI Comments:  Mia [name] is a 65 y.o. female with PMHx of sclerosis, NIDDM, RA, and HTN who presents to the Emergency Department complaining of severe, worsening pain in her L buttock x 1 day. Pt describes the pain as "stabbing" and that the pain radiates down her L thigh. Pt notes having a previous Hx of similar pain across lower back. Pt has a history of bladder incontinence and Interstitial cystitis; her symptoms have been worse recently but have not directly correlated with onset of back pain. Pt states that pain is exacerbated with movement, no alleviating factors. Pt has used ice, Aleve, Voltaren gel, and massage without relief. Pt denies any falls, injuries or overexertion. Pt further denies skin changes, rashes, numbness, weakness, fever, rapid weight loss, Hx of cancer, vomiting, abdominal pain, constipation, bowel incontinence, or problems retaining urine.   Past Medical History:  Diagnosis Date  . Anal fissure   . Anemia   . Arthritis   . Blood transfusion   . C. difficile colitis   . Chest pain    a. 01/2013 MV: EF 59%, no ischemia.  . Chronic Dyspnea    a. 01/2013 Echo: EF 60-65%, Gr 1 DD, PASP 76mmHg.  . Diabetes mellitus   . Fibromyalgia   . Gall stones   . GERD (gastroesophageal reflux disease)    gastritis  . Hemorrhoids   . Hypertension   . Interstitial cystitis   . Memory difficulty 12/14/2013  . Neuromuscular disorder ([place])    sclerosis  . Osteoporosis   . Peptic ulcer   .  PONV (postoperative nausea and vomiting)   . Pseudogout   . Raynaud phenomenon   . Rectal bleeding   . Sleep apnea    a. on cpap.  Marland Kitchen SVT (supraventricular tachycardia) ([place])   . Syncope 09/10/2015  . Thyroid disease    hypothyroidism  . Tremor, essential 05/14/2016    Patient Active Problem List   Diagnosis Date Noted  . Tremor, essential 05/14/2016  . Right foot pain 04/14/2016  . B12 deficiency 09/10/2015  . Syncope 09/10/2015  . Cervical facet joint syndrome 09/10/2015  . Chronic fatigue 09/10/2015  . Aortic atherosclerosis ([place]) 04/01/2015  . 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 Chronic Dyspnea   . Chest pain   . DOE (dyspnea on exertion)   . Benign head tremor 06/11/2014  . Low back pain 06/11/2014  . IFG (impaired fasting glucose) 03/09/2014  . Osteopenia 03/09/2014  . IC (interstitial cystitis) 03/09/2014  . Hemorrhoid 03/09/2014  . Retinal wrinkling, right eye 03/09/2014  . Fibromyalgia 03/09/2014  . GERD (gastroesophageal reflux disease) 03/09/2014  . History of arthroplasty of right knee 01/31/2014  . Memory difficulty 12/14/2013  . Hemorrhoids, external, thrombosed 06/22/2011  . Hypertension 03/11/2011  . SVT (supraventricular tachycardia) ([place])   . Raynaud phenomenon   . EDEMA 08/25/2010  . Nonspecific (abnormal) findings on radiological and other examination of body structure 08/25/2010  . COMPUTERIZED TOMOGRAPHY, CHEST, ABNORMAL 08/25/2010  .  OSA (obstructive sleep apnea) 04/24/2009  . 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 Allergic rhinitis 06/11/2008  . Dyspnea 06/11/2008  . 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 Chronic diastolic CHF (congestive heart failure) ([place]) 06/08/2008  . INTERSTITIAL CYSTITIS 06/08/2008    Past Surgical History:  Procedure Laterality Date  . APPENDECTOMY    . BLADDER SURGERY     x2  . BLADDER SURGERY    . BREAST BIOPSY    . CARDIAC CATHETERIZATION    . CATARACT EXTRACTION  Bilateral   . CHOLECYSTECTOMY    . DILATION AND CURETTAGE OF UTERUS    . ENTEROCELE REPAIR     x2  . EYE SURGERY     retina  . hysterectomy - unknown type    . KNEE SURGERY     x6  . NECK SURGERY     fusion  . OOPHORECTOMY    . QUADRICEPS REPAIR Right   . RECTOCELE REPAIR     x2  . SHOULDER ARTHROSCOPY WITH SUBACROMIAL DECOMPRESSION, ROTATOR CUFF REPAIR AND BICEP TENDON REPAIR  10/06/2012   Procedure: SHOULDER ARTHROSCOPY WITH SUBACROMIAL DECOMPRESSION, ROTATOR CUFF REPAIR AND BICEP TENDON REPAIR;  Surgeon: Nita Sells, MD;  Location: [facility];  Service: Orthopedics;  Laterality: Right;  Arthroscopic  Repair  of  Subscapularis, Open Biceps Tenodesis  . SHOULDER SURGERY     bilateral- bones spur  . TONSILLECTOMY    . TOTAL KNEE ARTHROPLASTY      OB History    No data available       Home Medications    Prior to Admission medications   Medication Sig Start Date End Date Taking? Authorizing Provider  albuterol (PROVENTIL HFA;VENTOLIN HFA) 108 (90 Base) MCG/ACT inhaler Inhale 2 puffs into the lungs every 6 (six) hours as needed for wheezing or shortness of breath. 01/28/16   Jade L Breeback, PA-C  alendronate (FOSAMAX) 70 MG tablet Take 1 tablet by mouth  every 7 days with a full  glass of water on an empty  stomach 03/16/16   Hali Marry, MD  atorvastatin (LIPITOR) 40 MG tablet Take 1 tablet by mouth  daily 08/27/15   Thayer Headings, MD  calcium carbonate (OS-CAL) 600 MG TABS Take 600 mg by mouth daily with breakfast.     Historical Provider, MD  cetirizine (ZYRTEC) 10 MG tablet Take 10 mg by mouth as needed for allergies.    Historical Provider, MD  donepezil (ARICEPT) 5 MG tablet Take 1 tablet (5 mg total) by mouth at bedtime. 11/15/15   Kathrynn Ducking, MD  fluticasone [facility]) 50 MCG/ACT nasal spray Place into both nostrils as needed for allergies or rhinitis.    Historical Provider, MD  furosemide (LASIX) 20 MG tablet Take 1 tablet by  mouth  daily 08/27/15   Thayer Headings, MD  gabapentin (NEURONTIN) 300 MG capsule Take 1 to 2 capsules by  mouth every night at  bedtime to two times daily 02/10/16   Silverio Decamp, MD  glucose blood test strip Use to test blood sugar once daily. Dx:R73.01 01/06/16   Hali Marry, MD  ipratropium (ATROVENT) 0.03 % nasal spray Place 2 sprays into both nostrils every 12 (twelve) hours. 07/17/15   Hali Marry, MD  leflunomide (ARAVA) 20 MG tablet Take 1 tablet by mouth daily. 12/17/14   Historical Provider, MD  metFORMIN (GLUCOPHAGE) 500 MG tablet TAKE 1 TABLET BY MOUTH  TWICE DAILY WITH MEALS 04/29/16   Rene Kocher  [place], MD  methocarbamol (ROBAXIN) 500 MG tablet Take 1 tablet (500 mg total) by mouth 3 (three) times daily. 01/01/16   Silverio Decamp, MD  metoprolol (LOPRESSOR) 50 MG tablet Take 1 tablet by mouth two  times daily 08/27/15   Thayer Headings, MD  NON FORMULARY Place into the nose at bedtime. cpap machine    Historical Provider, MD  ondansetron (ZOFRAN ODT) 4 MG disintegrating tablet Take one tab by mouth Q6hr prn nausea 06/12/16   Kandra Nicolas, MD  pantoprazole (PROTONIX) 40 MG tablet Take 1 tablet by mouth two  times daily 08/27/15   Thayer Headings, MD  predniSONE (DELTASONE) 20 MG tablet Take 2 tablets (40 mg total) by mouth daily. 06/20/16   Sharlett Iles, MD  primidone (MYSOLINE) 50 MG tablet Take 1 tablet by mouth at  bedtime 04/29/16   Silverio Decamp, MD  venlafaxine XR (EFFEXOR-XR) 75 MG 24 hr capsule Take 1 capsule by mouth  daily with breakfast 12/10/15   Kathrynn Ducking, MD  vitamin D, CHOLECALCIFEROL, 400 UNITS tablet Take 400 Units by mouth daily.     Historical Provider, MD    Family History Family History  Problem Relation Age of Onset  . Heart attack Mother   . Stroke Mother   . Diabetes Mother   . Heart disease Mother   . Colon polyps Mother   . Breast cancer Maternal Grandmother   . Wilson's disease Maternal Grandmother   .  Pancreatic cancer Maternal Grandfather   . Heart disease Father   . Heart attack Father   . Motor neuron disease Sister   . Stroke Sister   . Lupus Sister   . Dementia Sister   . Arthritis/Rheumatoid Sister   . Asthma Sister   . Colon polyps Sister   . Irritable bowel syndrome Sister     Social History Social History  Substance Use Topics  . Smoking status: Never Smoker  . Smokeless tobacco: Never Used  . Alcohol use No     Allergies   Myrbetriq  [mirabegron]; Percocet [oxycodone-acetaminophen]; Celebrex [celecoxib]; Codeine; Darvocet [propoxyphene n-acetaminophen]; Erythromycin; Hydrocodone; Lyrica [pregabalin]; Macrodantin [nitrofurantoin]; Toradol [ketorolac tromethamine]; Tramadol; and Verapamil   Review of Systems Review of Systems 10 Systems reviewed and are negative for acute change except as noted in the HPI.   Physical Exam Updated Vital Signs BP 117/74 (BP Location: Right Arm)   Pulse 76   Temp 98.1 F (36.7 C) (Oral)   Resp 18   Ht 5\' 6"  (1.676 m)   Wt 172 lb (78 kg)   SpO2 98%   BMI 27.76 kg/m   Physical Exam  Constitutional: She is oriented to person, place, and time. She appears well-developed and well-nourished. No distress.  Awake, alert  HENT:  Head: Normocephalic and atraumatic.  Eyes: Conjunctivae are normal. Pupils are equal, round, and reactive to light.  Neck: Neck supple.  Cardiovascular: Normal rate, regular rhythm, normal heart sounds and intact distal pulses.   No murmur heard. Pulmonary/Chest: Effort normal and breath sounds normal. No respiratory distress.  Abdominal: Soft. Bowel sounds are normal. She exhibits no distension.  Musculoskeletal: She exhibits no edema.  No midline spinal tenderness; tenderness to palpation of left upper buttock  Neurological: She is alert and oriented to person, place, and time. She has normal reflexes. No cranial nerve deficit. She exhibits normal muscle tone.  Fluent speech, no clonus 5/5 strength  and normal sensation x all 4 extremities  Skin: Skin  is warm and dry. No rash noted.  Psychiatric: She has a normal mood and affect. Judgment and thought content normal.  Nursing note and vitals reviewed.    ED Treatments / Results  DIAGNOSTIC STUDIES:  Oxygen Saturation is 97% on RA, normal by my interpretation.    COORDINATION OF CARE:  12:23 AM Will order back x-ray. Discussed treatment plan with pt at bedside and pt agreed to plan.  Labs (all labs ordered are listed, but only abnormal results are displayed) Labs Reviewed  URINALYSIS, ROUTINE W REFLEX MICROSCOPIC (NOT AT [facility]) - Abnormal; Notable for the following:       Result Value   Specific Gravity, Urine 1.002 (*)    All other components within normal limits    EKG  EKG Interpretation None       Radiology Dg Lumbar Spine Complete  Result Date: 06/20/2016 CLINICAL DATA:  Worsening pain in the left buttock. EXAM: LUMBAR SPINE - COMPLETE 4+ VIEW COMPARISON:  None. FINDINGS: There is no evidence of lumbar spine fracture. Alignment is normal. There are mild multilevel osteoarthritic changes of the lumbosacral spine, most prominent at L2-L3, L3-L4 and L4-L5. Mild posterior facet arthropathy at L4-L5 and L5-S1. No significant bony neural foramina narrowing. Cholecystectomy clips are noted.  Soft tissues otherwise normal. IMPRESSION: Mild osteoarthritic changes of the lumbosacral spine. Electronically Signed   By: Fidela Salisbury M.D.   On: 06/20/2016 17:13    Procedures Procedures (including critical care time)  Medications Ordered in ED Medications - No data to display   Initial Impression / Assessment and Plan / ED Course  I have reviewed the triage vital signs and the nursing notes.  Pertinent imaging results that were available during my care of the patient were reviewed by me and considered in my medical decision making (see chart for details).  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 No fever or recent infectious symptoms. No trauma. She was uncomfortable on exam but neurologically intact. Obtained plain film which shows degenerative changes but no acute findings. Patient demonstrates no lower extremity weakness, saddle anesthesia, bowel or bladder incontinence, or any other neurologic deficits concerning for cauda equina. No fevers or other infectious symptoms to suggest by the patient's back pain is due to an infection. I have reviewed return precautions, including the development of any of these signs or symptoms, and the patient has voiced understanding. Provided w/ steroid course given sx suggestive of sciatica. I reviewed supportive care instructions, including NSAIDs, tylenol, heat therapy, early range of motion exercises, and f/u with spine specialist. Patient voiced understanding and was discharged in satisfactory condition.   Final Clinical Impressions(s) / ED Diagnoses   Final diagnoses:  Sciatica of left side    New Prescriptions Discharge Medication List as of 06/20/2016  5:50 PM    START taking these medications   Details  predniSONE (DELTASONE) 20 MG tablet Take 2 tablets (40 mg total) by mouth daily., Starting Sat 06/20/2016, Print       I personally performed the services described in this documentation, which was scribed in my presence. The recorded information has been reviewed and is accurate.     Sharlett Iles, MD 06/21/16 [phone]

## 2016-06-22 ENCOUNTER — Telehealth

## 2016-06-22 NOTE — Telephone Encounter (Signed)
Patient had a mammogram and ultrasound done is February she she is supposed to have another one in [place]. Please enter orders.

## 2016-06-22 NOTE — Progress Notes (Signed)
Patient called and asked me to send the order to both [facility] and [facility] to see who could get her in sooner, faxed the order to both placed as pt requested.    Laura Roman

## 2016-06-23 NOTE — Telephone Encounter (Signed)
ordered

## 2016-06-23 NOTE — Telephone Encounter (Signed)
Pt advised

## 2016-06-24 NOTE — Progress Notes (Signed)
CTU-Appointments for this Order   06/26/2016 1300 - 30 min [facility] CT RM 1 ([facility]) [facility]

## 2016-06-26 ENCOUNTER — Other Ambulatory Visit: Payer: Medicare Other

## 2016-06-26 ENCOUNTER — Inpatient Hospital Stay: Admit: 2016-06-26 | Payer: BLUE CROSS/BLUE SHIELD | Attending: Urology | Primary: Internal Medicine

## 2016-06-26 DIAGNOSIS — D259 Leiomyoma of uterus, unspecified: Secondary | ICD-10-CM

## 2016-06-26 LAB — CREATININE, POC
Creatinine, POC: 0.8 MG/DL (ref 0.6–1.3)
GFRAA, POC: >60 mL/min/{1.73_m2} (ref >60)
GFRNA, POC: >60 mL/min/{1.73_m2} (ref >60)

## 2016-06-26 MED ORDER — IOPAMIDOL 76 % IV SOLN
370 mg iodine /mL (76 %) | Freq: Once | INTRAVENOUS | Status: AC
Start: 2016-06-26 — End: 2016-06-26
  Administered 2016-06-26: 17:00:00 via INTRAVENOUS

## 2016-06-26 MED FILL — ISOVUE-370  76 % INTRAVENOUS SOLUTION: 370 mg iodine /mL (76 %) | INTRAVENOUS | Qty: 100

## 2016-06-30 ENCOUNTER — Ambulatory Visit: Payer: Medicare Other

## 2016-06-30 ENCOUNTER — Encounter

## 2016-06-30 ENCOUNTER — Inpatient Hospital Stay: Admit: 2016-06-30 | Payer: BLUE CROSS/BLUE SHIELD | Attending: Internal Medicine | Primary: Internal Medicine

## 2016-06-30 DIAGNOSIS — R928 Other abnormal and inconclusive findings on diagnostic imaging of breast: Secondary | ICD-10-CM

## 2016-07-02 ENCOUNTER — Other Ambulatory Visit: Payer: Self-pay | Admitting: Family Medicine

## 2016-07-02 ENCOUNTER — Other Ambulatory Visit: Payer: Self-pay | Admitting: Neurology

## 2016-07-02 ENCOUNTER — Other Ambulatory Visit: Payer: Self-pay | Admitting: Cardiovascular Disease

## 2016-07-03 DIAGNOSIS — M545 Low back pain: Secondary | ICD-10-CM | POA: Diagnosis not present

## 2016-07-06 ENCOUNTER — Encounter: Payer: Self-pay | Admitting: Family Medicine

## 2016-07-06 ENCOUNTER — Ambulatory Visit (INDEPENDENT_AMBULATORY_CARE_PROVIDER_SITE_OTHER): Payer: Medicare Other | Admitting: Family Medicine

## 2016-07-06 VITALS — BP 134/75 | HR 79 | Ht 66.0 in | Wt 177.0 lb

## 2016-07-06 DIAGNOSIS — D692 Other nonthrombocytopenic purpura: Secondary | ICD-10-CM

## 2016-07-06 DIAGNOSIS — R51 Headache: Secondary | ICD-10-CM

## 2016-07-06 DIAGNOSIS — Z23 Encounter for immunization: Secondary | ICD-10-CM

## 2016-07-06 DIAGNOSIS — E538 Deficiency of other specified B group vitamins: Secondary | ICD-10-CM

## 2016-07-06 DIAGNOSIS — R7301 Impaired fasting glucose: Secondary | ICD-10-CM | POA: Diagnosis not present

## 2016-07-06 DIAGNOSIS — F0781 Postconcussional syndrome: Secondary | ICD-10-CM

## 2016-07-06 DIAGNOSIS — I10 Essential (primary) hypertension: Secondary | ICD-10-CM | POA: Diagnosis not present

## 2016-07-06 DIAGNOSIS — K529 Noninfective gastroenteritis and colitis, unspecified: Secondary | ICD-10-CM

## 2016-07-06 DIAGNOSIS — R519 Headache, unspecified: Secondary | ICD-10-CM

## 2016-07-06 LAB — POCT GLYCOSYLATED HEMOGLOBIN (HGB A1C): Hemoglobin A1C: 5.7

## 2016-07-06 MED ORDER — CYANOCOBALAMIN 1000 MCG/ML IJ SOLN
1000.0000 ug | Freq: Once | INTRAMUSCULAR | Status: AC
  Administered 2016-07-06: 1000 ug via INTRAMUSCULAR

## 2016-07-06 MED ORDER — AMITRIPTYLINE HCL 25 MG PO TABS: 25.0000 mg | ORAL_TABLET | Freq: Every day | ORAL | 1 refills | Status: DC

## 2016-07-06 MED ORDER — SUMATRIPTAN SUCCINATE 50 MG PO TABS: 50.0000 mg | ORAL_TABLET | ORAL | 6 refills | Status: DC | PRN

## 2016-07-06 MED ORDER — KETOROLAC TROMETHAMINE 60 MG/2ML IM SOLN
60.0000 mg | Freq: Once | INTRAMUSCULAR | Status: AC
  Administered 2016-07-06: 60 mg via INTRAMUSCULAR

## 2016-07-06 NOTE — Progress Notes (Signed)
Subjective:    CC:  HTN  HPI: Hypertension- Pt denies chest pain, SOB, dizziness, or heart palpitations.  Taking meds as directed w/o problems.  Denies medication side effects.    IFG - no inc thirst or urinartion.   She also went to the emergency department on August 19 for head injury. She hit her head on a beam approximately on August 13. She had her 4 head on the threshold of a barn door. She started expressing nausea and headache and actually vomited and went to the emergency department. She had a negative head CT but has had some headaches on and off since then. Is been using Excedrin and Aleve for pain control. She is waking up with them.  She did not lose consciousness. Says yesterday felt like she had a tremor.    She still continues to have diarrhea on and off. She said she'll have a few days where she doesn't have a bowel movement at all and wide diarrhea persistently for 2 or 3 days. It's not daily like it was several months ago so it seems a little bit better but still continues.   She also complains about easy bruising on her forearms in particular. She mentioned it to her dermatologist and he recommended vitamin C and zinc daily.  she just wanted to make sure that neither one of these products are causing her headaches.  Past medical history, Surgical history, Family history not pertinant except as noted below, Social history, Allergies, and medications have been entered into the medical record, reviewed, and corrections made.   Review of Systems: No fevers, chills, night sweats, weight loss, chest pain, or shortness of breath.   Objective:    General: Well Developed, well nourished, and in no acute distress.  Neuro: Alert and oriented x3, extra-ocular muscles intact, sensation grossly intact.  HEENT: Normocephalic, atraumati, oropharynx is clear, TMs and canals are clear bilaterally. No cervical lymphadenopathy.  Skin: Warm and dry, no rashes. Cardiac: Regular rate and  rhythm, no murmurs rubs or gallops, no lower extremity edema.  Respiratory: Clear to auscultation bilaterally. Not using accessory muscles, speaking in full sentences.   Impression and Recommendations:    HTN - Well controlled. Continue current regimen. Follow up in 6 mo.   IFG - well controlled. A1C is 5.7. F/U in 6 months.    b12 def - given B12 injection today. Senile purpura -   Postconcussive syndrome-still having some daily headaches waking up with them. Negative CT scan. We'll add amitriptyline to 25 mg at bedtime. F/U in 1 month.    Dairrhea - And she needs to persist. She's had prior history of C. difficile. 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 But we did do stool cultures and C. difficile in July and it was negative. refer to GI.   Senile purpura-gave reassurance.

## 2016-07-07 ENCOUNTER — Telehealth: Payer: Self-pay | Admitting: Cardiovascular Disease

## 2016-07-07 ENCOUNTER — Telehealth: Payer: Self-pay | Admitting: *Deleted

## 2016-07-07 DIAGNOSIS — Z79899 Other long term (current) drug therapy: Secondary | ICD-10-CM

## 2016-07-07 DIAGNOSIS — E785 Hyperlipidemia, unspecified: Secondary | ICD-10-CM

## 2016-07-07 NOTE — Telephone Encounter (Signed)
New message    Pt would like an order to schedule labs previous order has expired. Please call.

## 2016-07-07 NOTE — Telephone Encounter (Signed)
Prior auth initiated through covermymeds. Called patient to see what she has tried and failed for migraines in the past. Really patient needs a f/u for migraines.[id number] - PA Case ID

## 2016-07-07 NOTE — Telephone Encounter (Signed)
Called patient about scheduling lab work before her appointment with Dr. Acie Fredrickson. Patient had lab work that was expired. Ordered patient CMET and Lipid for 07/27/16, before her appointment on 07/29/16. Patient verbalized understanding.

## 2016-07-08 ENCOUNTER — Ambulatory Visit
Admit: 2016-07-08 | Discharge: 2016-07-08 | Payer: PRIVATE HEALTH INSURANCE | Attending: Urology | Primary: Internal Medicine

## 2016-07-08 DIAGNOSIS — R319 Hematuria, unspecified: Secondary | ICD-10-CM

## 2016-07-08 LAB — AMB POC URINALYSIS DIP STICK AUTO W/O MICRO
Bilirubin (UA POC): NEGATIVE
Glucose (UA POC): NEGATIVE
Ketones (UA POC): NEGATIVE
Nitrites (UA POC): NEGATIVE
Protein (UA POC): NEGATIVE mg/dL
Specific gravity (UA POC): 1.015 (ref 1.001–1.035)
Urobilinogen (UA POC): 0.2 (ref 0.2–1)
pH (UA POC): 7 (ref 4.6–8.0)

## 2016-07-08 MED ORDER — CEPHALEXIN 500 MG CAP
500 mg | ORAL_CAPSULE | Freq: Three times a day (TID) | ORAL | 0 refills | Status: AC
Start: 2016-07-08 — End: 2016-07-11

## 2016-07-08 MED ORDER — SUMATRIPTAN SUCCINATE 50 MG PO TABS: 50.0000 mg | ORAL_TABLET | ORAL | 6 refills | Status: DC | PRN

## 2016-07-08 NOTE — Telephone Encounter (Signed)
Form asks how many migraines per month does patient get; called patient and before she got treatmeant she states she would get 6-7 migraines a month.

## 2016-07-08 NOTE — Telephone Encounter (Signed)
Insurance denied med for not meeting the qunatity limit. Plan allows # 9 tablets a month. Resending rx for quantity of #9

## 2016-07-08 NOTE — Progress Notes (Signed)
CYSTOSCOPY PROCEDURE      Patient Name: Laura Roman            Date of Procedure: 07/08/2016     INDICATIONS:     Pre-Procedure Diagnosis:   Gross Hematuria  Post-Procedure Diagnosis:  Gross Hematuria, Bladder Lesions x2  Procedure:    Flexible Cystoscopy      Consent:  All risks, benefits and options were reviewed in detail and the patient agrees to procedure. Risks include but are not limited to bleeding, infection, sepsis, death, dysuria and others.                        FINDINGS:     The patient was placed in the supine position, and prepped and draped in the normal fashion. 5 ml of 4% Lidocaine gel was placed in the urethra. Once adequate anesthesia was achieved; the flexible cystoscope was placed into the bladder.             Meatus:     severely retracted  Urethra: normal:    yes  Bladder neck: normal:    yes  Trigone: normal:   yes  Bilateral Ureteral Orifices Orthotopic: yes    Other Findings:  Trabeculation:    no  Diverticuli:     no  Lesion:      yes       Unifocal/Multifocal: Unifocal       Location:   Left Posterior Wall       Number of lesions:  2    NBI:     yes New Findings: Two inflammatory lesions measuring 1-52mm are seen affecting left posterior wall.  Retroflexed:     yes    LAB:     UA:   Results for orders placed or performed in visit on 07/08/16   AMB POC URINALYSIS DIP STICK AUTO W/O MICRO   Result Value Ref Range    Color (UA POC) Yellow     Clarity (UA POC) Clear     Glucose (UA POC) Negative Negative    Bilirubin (UA POC) Negative Negative    Ketones (UA POC) Negative Negative    Specific gravity (UA POC) 1.015 1.001 - 1.035    Blood (UA POC) Trace Negative    pH (UA POC) 7.0 4.6 - 8.0    Protein (UA POC) Negative Negative mg/dL    Urobilinogen (UA POC) 0.2 mg/dL 0.2 - 1    Nitrites (UA POC) Negative Negative    Leukocyte esterase (UA POC) 1+ Negative     Urine Cytology 06/17/16- NEGATIVE FOR HIGH-GRADE UROTHELIAL CARCINOMA    CTU 06/26/16  Urological Findings:  ??   - Kidneys: The noncontrast images show no renal, ureteral or bladder calculi.  The nephrographic images show no renal mass. The 10 minute delayed images show  good opacification of the renal collecting system and ureters without filling  defect or mass.  ??  - Bladder: On the 10 minute delayed images the bladder is filled with contrast  approximately 50%. The anterior portion of the bladder is not well opacified.  ??  ??  Additional Findings:   ??  - Lung bases: Unremarkable.  ??  - Liver: Unremarkable.  ??  - Bile ducts: No intrahepatic biliary dilatation. No extrahepatic biliary  dilatation.   ??  - Gallbladder: Unremarkable.  ??  - Pancreas: No pancreatic ductal dilatation. Unremarkable.  ??  - Spleen: A few benign-appearing small splenic cysts are present.  ??  -  Adrenals: Unremarkable.  ??  - Pelvic organs: A few fibroids are seen in the uterus. The ovaries are normal.  ??  - Bowel: No pneumatosis or mesenteric venous gas. No obstruction. Small  sliding-type hiatal hernia.   ??  - Vessels: Unremarkable.  ??  - Lymph nodes: No enlarged lymph nodes.  ??  - Peritoneum: No pneumoperitoneum or ascites.  ??  - Osseous structures: Unremarkable.  ??  ??  IMPRESSION  1.  No findings to explain gross hematuria. Study is of good quality with  complete opacification of the ureters and renal collecting system. The bladder  is partially filled with contrast on the delayed images. No stone or mass  identified.  ??  2.  Fibroid uterus.  ??  3.  Small sliding-type hiatal hernia.    ASSESSMENT:     Chief Complaint   Patient presents with   ??? Microscopic Hematuria   ??? Procedure     Painless Gross Hematuria x2 occurring in June and July 2017   -Negative cytology 06/17/16    Current Disease Status:  Gross hematuria x2  Current Treatment Plan:Hematuria w/u    Last Imaging: CTU 8/17-NED    PLAN:     -Reviewed recent CTU with Pt  -Reviewed prior urine cytology with Pt  -Two small inflammatory lesions seen affecting left posterior wall on Today's Cysto   -Samples of Estrace Cream provided.  -Antibiotic provided: cipro 500mg  one tab        ICD-10-CM ICD-9-CM    1. Hematuria R31.9 599.70 AMB POC URINALYSIS DIP STICK AUTO W/O MICRO      CYSTOURETHROSCOPY   2. Lesion of bladder N32.9 596.9    3. Urine leukocytes R82.99 791.7 URINE C&S         Legrand Como B. Jimmye Norman, MD, [place]   Urologic Oncology  [facility] [place]    Associate Professor of Urology  Department of Urology  [facility], [place], [place]     Pager:  [phone]  Office:  [phone]    Documentation provided by Augusto Garbe, medical scribe for Dr. Dulcy Fanny.

## 2016-07-08 NOTE — Progress Notes (Signed)
Urology Preop History and Physical Exam    Laura Roman   MRN: [id number]  Jun 28, 1951  07/08/2016     PLANNED PROCEDURE:     Cysto, TURBT, EUA on 07/10/16    ASSESSMENT:     Painless Gross Hematuria x2 occurring in June and July 2017                        -Negative cytology 06/17/16  ??  Current Disease Status: Two small lesions seen on 07/08/16 Cysto  Current Treatment Plan: TURBT  ??  Last Imaging: CTU 8/17-NED  ??    PLAN:     -Reviewed recent CTU with Pt  -Reviewed prior urine cytology with Pt  -Samples of Estrace Cream provided.  -Two small inflammatory lesions seen affecting left posterior wall on Todays Cysto. See separate note.  -To OR for Cysto, TURBT, EUA  Preop Counseling Note for TURBT:  We discussed the risks and benefits of transurethral resection of a bladder tumor. These include: infection, bleeding, anesthesia as well as injury to other structures including perforation of the bladder or ureter (if need for evaluation of the ureter). All of their questions have been answered to their satisfaction and they have expressed an understanding of the risks and have agreed to proceed.  -Urine Cx  -Keflex 500mg  TID x 3 days- rx sent    SUBJECTIVE:     Chief Complaint   Patient presents with   ??? Microscopic Hematuria   ??? Procedure       HPI:  Laura Roman is a 65 y.o. female who presents for the above procedure.  Pt initially presented today for a cystoscopy procedure 2/2 gross hematuria which revealed Two inflammatory lesions measuring 1-38mm at the left posterior wall. She was also found to have severely retracted meatus on exam. No n/v/f/c, recurrence of gross hematuria, flank pain, abdominopelvic pain, recent UTIs, or dysuria. Last CTU in 9/17 was NED. Urine Cytology from 06/15/16 was Negative for Malignancy. Never Smoker     ECOG PS: 0      Review of Systems  Constitutional: Fever: No  Skin: Rash: No  HEENT: Hearing difficulty: No  Eyes: Blurred vision: No  Cardiovascular: Chest pain: No   Respiratory: Shortness of breath: No  Gastrointestinal: Nausea/vomiting: No  Musculoskeletal: Back pain: No  Neurological: Weakness: No  Psychological: Memory loss: No  Comments/additional findings:       Past Medical History:   Diagnosis Date   ??? Allergic rhinitis    ??? Anxiety state, unspecified    ??? Arthritis     Dr. Synetta Shadow, Dr. Marisa Hua   ??? Compression fx, thoracic spine [facility]) 2007    negative DEXA Dr. Marisa Hua   ??? Dyslipidemia    ??? Dyspepsia    ??? Frozen shoulder     right Dr. Wynonia Hazard MRI   ??? Left thyroid nodule 04/2016    1cm nodule; apparently noted on CT 2008 and unchanged   ??? Multiple lung nodules     no change 10/06, 03/07, 03/08   ??? Overweight (BMI 25.0-29.9)    ??? Palpitations 2008    neg thallium 2008, nl holter 2008, echo nl lv/ef 65%/tr mr/dd/nl pasp   ??? Prediabetes    ??? Syncope     neurocardiogenic by tilt 1994   ??? Venous insufficiency    ??? Vitamin D deficiency        Past Surgical History:   Procedure Laterality Date   ??? ECHO 2D  ADULT  12/04    normal with EF 70%   ??? HX COLONOSCOPY      Dr Rosendo Gros 2007 negative   ??? HX GYN      s/p BTL    ??? HX HEMORRHOIDECTOMY      Dr. Rosendo Gros 2007   ??? STRESS TEST THALLIUM STUDY      2005 neg; 1/17 negative ef 75%   ??? [place] ABD COMP  9/05    negative   ??? VAS CAROTID DUPLEX BILATERAL  2004    negative       Allergies   Allergen Reactions   ??? Fish Oil Nausea Only   ??? Relafen [Nabumetone] Nausea Only       Current Outpatient Prescriptions   Medication Sig   ??? cephALEXin (KEFLEX) 500 mg capsule Take 1 Cap by mouth three (3) times daily for 3 days.   ??? atorvastatin (LIPITOR) 40 mg tablet take 1 tablet by mouth once daily   ??? citalopram (CELEXA) 10 mg tablet take 1 tablet by mouth once daily   ??? lansoprazole (PREVACID) 30 mg capsule take 1 capsule by mouth once daily BEFORE BREAKFAST   ??? VITAMIN D2 50,000 unit capsule take 1 capsule by mouth every week   ??? omega-3 fatty acids (FISH OIL) cap Take 2 Tabs by mouth daily.    ??? multivitamin (ONE A DAY) tablet Take 1 Tab by mouth daily.   ??? aspirin 81 mg chewable tablet Take 81 mg by mouth daily.     No current facility-administered medications for this visit.        Family History   Problem Relation Age of Onset   ??? Diabetes Mother    ??? Elevated Lipids Father    ??? Stroke Paternal Grandmother    ??? Heart Disease Paternal Grandfather      I have reviewed the family history and there are no changes at this time.      Social History     Social History   ??? Marital status: MARRIED     Spouse name: N/A   ??? Number of children: 2   ??? 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 Smoking status: Never Smoker   ??? Smokeless tobacco: None   ??? Alcohol use 2.4 oz/week     4 Glasses of wine per week   ??? Drug use: No   ??? Sexual activity: Not Asked     Other Topics Concern   ??? None     Social History Narrative         OBJECTIVE:     Physical Exam:  Vitals:    07/08/16 1357   BP: 120/78   Weight: 170 lb (77.1 kg)   Height: 5\' 4"  (1.626 m)       Gen:  WDWN WF, A&Ox3, NAD  Pulm: CTA B, no r/r/w  CV:  normal rate, regular rhythm, normal S1, S2, no murmurs, rubs, clicks or gallops.  Abd: non-distended; soft. NABS. No HSM  GU: no costovertebral angle or suprapubic tenderness   Ext: No edema; FROM all 4 extremities   Neuro: Grossly intact   Skin: warm and dry       LAB/ IMAGING:     Results for orders placed or performed in visit on 07/08/16   AMB POC URINALYSIS DIP STICK AUTO W/O MICRO   Result Value Ref Range  Color (UA POC) Yellow     Clarity (UA POC) Clear     Glucose (UA POC) Negative Negative    Bilirubin (UA POC) Negative Negative    Ketones (UA POC) Negative Negative    Specific gravity (UA POC) 1.015 1.001 - 1.035    Blood (UA POC) Trace Negative    pH (UA POC) 7.0 4.6 - 8.0    Protein (UA POC) Negative Negative mg/dL    Urobilinogen (UA POC) 0.2 mg/dL 0.2 - 1    Nitrites (UA POC) Negative Negative     Leukocyte esterase (UA POC) 1+ Negative       IMAGING:    Urine Cytology 06/17/16- NEGATIVE FOR HIGH-GRADE UROTHELIAL CARCINOMA  ??  CTU 06/26/16  Urological Findings:  ????  - Kidneys: The noncontrast images show no renal, ureteral or bladder calculi.  The nephrographic images show no renal mass. The 10 minute delayed images show  good opacification of the renal collecting system and ureters without filling  defect or mass.  ????  - Bladder: On the 10 minute delayed images the bladder is filled with contrast  approximately 50%. The anterior portion of the bladder is not well opacified.  ????  ????  Additional Findings:   ????  - Lung bases: Unremarkable.  ????  - Liver: Unremarkable.  ????  - Bile ducts: No intrahepatic biliary dilatation. No extrahepatic biliary  dilatation.   ????  - Gallbladder: Unremarkable.  ????  - Pancreas: No pancreatic ductal dilatation. Unremarkable.  ????  - Spleen: A few benign-appearing small splenic cysts are present.  ????  - Adrenals: Unremarkable.  ????  - Pelvic organs: A few fibroids are seen in the uterus. The ovaries are normal.  ????  - Bowel: No pneumatosis or mesenteric venous gas. No obstruction. Small  sliding-type hiatal hernia.   ????  - Vessels: Unremarkable.  ????  - Lymph nodes: No enlarged lymph nodes.  ????  - Peritoneum: No pneumoperitoneum or ascites.  ????  - Osseous structures: Unremarkable.  ????  ????  IMPRESSION  1. ??No findings to explain gross hematuria. Study is of good quality with  complete opacification of the ureters and renal collecting system. The bladder  is partially filled with contrast on the delayed images. No stone or mass  identified.  ????  2. ??Fibroid uterus.  ????  3. ??Small sliding-type hiatal hernia.      Laura Roman. Jimmye Norman, MD, [place]   Urologic Oncology  [facility] [place]    Associate Professor of Urology  Department of Urology  [facility] [place], [place]     Pager:  [phone]  Office:  [phone]         ICD-10-CM ICD-9-CM    1. Hematuria R31.9 599.70 AMB POC URINALYSIS DIP STICK AUTO W/O MICRO      CYSTOURETHROSCOPY   2. Lesion of bladder N32.9 596.9    3. Urine leukocytes R82.99 791.7 URINE C&S       A copy of today's office visit with all pertinent imaging results and labs were sent to Albin Felling, MD for review.    Documentation provided by Augusto Garbe, medical scribe for Dr. Dulcy Fanny.

## 2016-07-10 LAB — URINE C&S

## 2016-07-10 NOTE — Telephone Encounter (Signed)
Received notice from MBW that he has instructed the Pt to have VT on Monday at [facility] location at 0900.    Placed on schedule accordingly. Msg sent to AKnight as FYI.

## 2016-07-13 ENCOUNTER — Institutional Professional Consult (permissible substitution): Admit: 2016-07-13 | Discharge: 2016-07-13 | Payer: PRIVATE HEALTH INSURANCE | Primary: Internal Medicine

## 2016-07-13 DIAGNOSIS — N3289 Other specified disorders of bladder: Secondary | ICD-10-CM

## 2016-07-13 NOTE — Progress Notes (Signed)
Laura Roman is a 65 y.o. female who is here today for cath removal per Dr.Williams' order.     S/p TURBT 15Sep2017    Visit Vitals   ??? Temp 97.4 ??F (36.3 ??C)   ??? Ht 5\' 4"  (1.626 m)   ??? Wt 170 lb (77.1 kg)   ??? BMI 29.18 kg/m2        Patient is advised that there will be a slight burning during removal of the catheter. Privacy was provided.       Patient was placed in sitting position.     Procedure:    Under clean technique, the balloon was emptied by cutting the balloon port of foley and drainng the amount of fluid used during inflation. The catheter was gently grasped and removed without difficulty. The area was examined and no skin breakdown was noted.   I have visualized the urine and recorded it as being yellow     Patient instructed to call office back at 1 pm to let us know how she voiding. Water/retention/hematuria instructions were relayed. She will resume asa&fish oil tomorrow if urine remains clear.   I spent approximately 15 minutes with the patient today explaining the importance of water and returning to our office <1600 if she is unable to void.  Patient also knows to report any symptoms of fever, chills, urinary frequency, burning, dribbling, hesitation in starting the stream of urine as well as cloudiness or any other unusual color or characteristic of the urine to our office.    Questions were answered.  She was reminded to keep upcoming appt 27Sep2017

## 2016-07-15 ENCOUNTER — Encounter: Payer: Self-pay | Admitting: Cardiovascular Disease

## 2016-07-20 ENCOUNTER — Encounter: Payer: Self-pay | Admitting: Sports Medicine

## 2016-07-20 ENCOUNTER — Ambulatory Visit (INDEPENDENT_AMBULATORY_CARE_PROVIDER_SITE_OTHER): Payer: Medicare Other | Admitting: Sports Medicine

## 2016-07-20 DIAGNOSIS — M5382 Other specified dorsopathies, cervical region: Secondary | ICD-10-CM

## 2016-07-20 DIAGNOSIS — M47812 Spondylosis without myelopathy or radiculopathy, cervical region: Secondary | ICD-10-CM

## 2016-07-20 MED ORDER — PREDNISONE 10 MG (48) PO TBPK: ORAL_TABLET | Freq: Every day | ORAL | 0 refills | Status: DC

## 2016-07-20 NOTE — Assessment & Plan Note (Signed)
Did well with the C4-C5 facet injection and a C6-C7 sided cervical epidural. Now having recurrence of pain, likely radiculopathy, periscapular. Prednisone taper, return to see me in one month, we will repeat her injections if no better.

## 2016-07-20 NOTE — Progress Notes (Signed)
  Subjective:    CC: L shoulder pain   HPI: 65 yo with history of cervical radiculopathy with facet and disc epidurals earlier this year, presenting with a week and a half of shoulder pain.  Patient says pain is "in the back of my shoulder, almost like it's underneath my shoulder" and associated with reaching outward and across her body.  She denies any injury or trauma.  Patient says pain occasionally shoots down her arm.  It has been waxing and waning for the past week.  She has tried Voltaren, Aleve, and icing without any relief.  She says pain is worse with extension of her back and inspiration, as well as worse with flexion.    Past medical history:  Negative.  See flowsheet/record as well for more information.  Surgical history: Negative.  See flowsheet/record as well for more information.  Family history: Negative.  See flowsheet/record as well for more information.  Social history: Negative.  See flowsheet/record as well for more information.  Allergies, and medications have been entered into the medical record, reviewed, and no changes needed.   Review of Systems: No fevers, chills, night sweats, weight loss, chest pain, or shortness of breath.   Objective:    General: Well Developed, well nourished, and in no acute distress.  Neuro: Alert and oriented x3, extra-ocular muscles intact, sensation grossly intact.  HEENT: Normocephalic, atraumatic, pupils equal round reactive to light, neck supple, no masses, no lymphadenopathy, thyroid nonpalpable.  Skin: Warm and dry, no rashes. Cardiac: Regular rate and rhythm, no murmurs rubs or gallops, no lower extremity edema.  Respiratory: Clear to auscultation bilaterally. Not using accessory muscles, speaking in full sentences. L Shoulder: Inspection reveals no abnormalities, atrophy or asymmetry. Palpation is normal with no tenderness over AC joint or bicipital groove. ROM is full in all planes. Rotator cuff strength normal throughout. No  signs of impingement with negative Neer and Hawkin's tests, empty can sign. Speeds and Yergason's tests normal. No labral pathology noted with negative Obrien's, negative clunk and good stability. Normal scapular function observed. No painful arc and no drop arm sign. Positive apprehension sign.     Impression and Recommendations:    1. 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 If pain persists, will repeat injections

## 2016-07-22 ENCOUNTER — Ambulatory Visit
Admit: 2016-07-22 | Discharge: 2016-07-22 | Payer: PRIVATE HEALTH INSURANCE | Attending: Urology | Primary: Internal Medicine

## 2016-07-22 DIAGNOSIS — D303 Benign neoplasm of bladder: Secondary | ICD-10-CM

## 2016-07-22 NOTE — Progress Notes (Signed)
Urology Progress Note    Laura Roman  07/22/2016    ASSESSMENT:     Two small bladder lesions seen on 07/08/16 Cysto, S/p 07/10/16 TURBT with Benign findings             -Negative Cytology 06/17/16   -presented with painless gross hematuria in June/July 2017    Current Disease Status: NED  Current Treatment Plan: Cytology 6 months   ??  Last Imaging: CTU 8/17-NED    PLAN:     -Reviewed recent TURBT Pathology with Pt  -Reviewed prior CTU with Pt  -F/u 6 months with urine cytology    Patient's BMI is out of the normal parameters.  Information about BMI was given to the patient.     SUBJECTIVE:     Chief Complaint   Patient presents with   ??? Other     Bladder Mass- Pt. could not give a urine specimen today.        HPI:  Laura Roman is a 65 y.o. female who presents today in postoperative followup. S/p 07/10/16 TURBT with Benign pathology.  TURBT was indicated after Two inflammatory lesions were seen affecting left posterior wall on 07/08/16 cystoscopy. She was also found to have severely retracted meatus on exam.  Pt is doing well today and denies any complications following recent surgery. No n/v/f/c, recurrence of gross hematuria, flank pain, abdominopelvic pain, recent UTIs, or dysuria. Last CTU in 9/17 was NED. Urine Cytology from 06/15/16 was Negative for Malignancy. Never Smoker       ECOG PS: 0    Review of Systems  Constitutional: Fever: No  Skin: Rash: No  HEENT: Hearing difficulty: No  Eyes: Blurred vision: No  Cardiovascular: Chest pain: No  Respiratory: Shortness of breath: No  Gastrointestinal: Nausea/vomiting: No  Musculoskeletal: Back pain: No  Neurological: Weakness: No  Psychological: Memory loss: No  Comments/additional findings:     Past Medical History:   Diagnosis Date   ??? Allergic rhinitis    ??? Anxiety state, unspecified    ??? Arthritis     Dr. Synetta Shadow, Dr. Marisa Hua   ??? Compression fx, thoracic spine [facility]) 2007    negative DEXA Dr. Marisa Hua   ??? Dyslipidemia    ??? Dyspepsia    ??? Frozen shoulder      right Dr. Wynonia Hazard MRI   ??? Left thyroid nodule 04/2016    1cm nodule; apparently noted on CT 2008 and unchanged   ??? Multiple lung nodules     no change 10/06, 03/07, 03/08   ??? Overweight (BMI 25.0-29.9)    ??? Palpitations 2008    neg thallium 2008, nl holter 2008, echo nl lv/ef 65%/tr mr/dd/nl pasp   ??? Prediabetes    ??? Syncope     neurocardiogenic by tilt 1994   ??? Venous insufficiency    ??? Vitamin D deficiency        Past Surgical History:   Procedure Laterality Date   ??? ECHO 2D ADULT  12/04    normal with EF 70%   ??? HX COLONOSCOPY      Dr Rosendo Gros 2007 negative   ??? HX GYN      s/p BTL    ??? HX HEMORRHOIDECTOMY      Dr. Rosendo Gros 2007   ??? STRESS TEST THALLIUM STUDY      2005 neg; 1/17 negative ef 75%   ??? [place] ABD COMP  9/05    negative   ??? VAS CAROTID DUPLEX BILATERAL  2004  negative       Allergies   Allergen Reactions   ??? Fish Oil Nausea Only   ??? Relafen [Nabumetone] Nausea Only       Current Outpatient Prescriptions   Medication Sig   ??? atorvastatin (LIPITOR) 40 mg tablet take 1 tablet by mouth once daily   ??? citalopram (CELEXA) 10 mg tablet take 1 tablet by mouth once daily   ??? lansoprazole (PREVACID) 30 mg capsule take 1 capsule by mouth once daily BEFORE BREAKFAST   ??? VITAMIN D2 50,000 unit capsule take 1 capsule by mouth every week   ??? omega-3 fatty acids (FISH OIL) cap Take 2 Tabs by mouth daily.   ??? multivitamin (ONE A DAY) tablet Take 1 Tab by mouth daily.   ??? aspirin 81 mg chewable tablet Take 81 mg by mouth daily.     No current facility-administered medications for this visit.        Family History   Problem Relation Age of Onset   ??? Diabetes Mother    ??? Elevated Lipids Father    ??? Stroke Paternal Grandmother    ??? Heart Disease Paternal Grandfather      I have reviewed the family history and there are no changes at this time.      Social History     Social History   ??? Marital status: MARRIED     Spouse name: N/A   ??? Number of children: 2   ??? 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 Smoking status: Never Smoker   ??? Smokeless tobacco: None   ??? Alcohol use 2.4 oz/week     4 Glasses of wine per week   ??? Drug use: No   ??? Sexual activity: Not Asked     Other Topics Concern   ??? None     Social History Narrative         OBJECTIVE:     Physical Exam:  Visit Vitals   ??? BP 118/80   ??? Ht 5\' 4"  (1.626 m)   ??? Wt 170 lb (77.1 kg)   ??? BMI 29.18 kg/m2     GENERAL:  The patient is well developed and well-nourished.  No respiratory distress  HEENT:  normocephalic.  Conjunctivae were pink.  Sclerae anicteric.  ABDOMEN:  Soft, non-tender, non-distended  CENTRAL NERVOUS SYSTEM:  Intact, no focal findings.  PERIPHERAL EXAMINATION:  No edema, discoloration, cyanosis or clubbing.    LABS/ IMAGING:     Results for orders placed or performed in visit on 07/08/16   URINE C&S   Result Value Ref Range    FINAL REPORT Microbiology results    AMB POC URINALYSIS DIP STICK AUTO W/O MICRO   Result Value Ref Range    Color (UA POC) Yellow     Clarity (UA POC) Clear     Glucose (UA POC) Negative Negative    Bilirubin (UA POC) Negative Negative    Ketones (UA POC) Negative Negative    Specific gravity (UA POC) 1.015 1.001 - 1.035    Blood (UA POC) Trace Negative    pH (UA POC) 7.0 4.6 - 8.0    Protein (UA POC) Negative Negative mg/dL    Urobilinogen (UA POC) 0.2 mg/dL 0.2 - 1    Nitrites (UA POC) Negative Negative    Leukocyte esterase (UA POC) 1+ Negative  TURBT Pathology 07/10/16  Diagnosis  A) URINARY BLADDER, LEFT POSTERIOR WALL, BIOPSY:  - GRANULATION TISSUE AND SPINDLE CELL REACTION  - NEGATIVE FOR MALIGNANCY  - SPARSE RESIDUAL UROTHELIUM SHOWS REACTIVE ATYPIA  COMMENT: THE FINDINGS ARE SIMILAR TO THOSE SEEN IN A PRIOR BIOPSY SITE. CLINICAL CORRELATION IS ADVISED.  ??  FINDINGS:  1. ?? Upon entrance into the bladder, bilateral ureteral orifices are in normal orthotopic position with clear ureteral efflux bilaterally.   2. ?? The patient does have a very retracted urethral meatus and the vaginal wall.  3. ?? The area of concern is approximately 2 to 5 mm in size area of erythema that appears to be from a prior biopsy site. ??The patient does not recall previous biopsy. ??This areas biopsied due to the erythema and passed off table as specimen and it was extensively cauterized with Bugbee electrocautery.  4. ?? Exam under anesthesia demonstrated no evidence for a 3-dimensional mass.  ??  CTU 06/26/16  Urological Findings:  ??  - Kidneys: The noncontrast images show no renal, ureteral or bladder calculi.  The nephrographic images show no renal mass. The 10 minute delayed images show  good opacification of the renal collecting system and ureters without filling  defect or mass.  ??  - Bladder: On the 10 minute delayed images the bladder is filled with contrast  approximately 50%. The anterior portion of the bladder is not well opacified.  ??  ??  Additional Findings:   ??  - Lung bases: Unremarkable.  ??  - Liver: Unremarkable.  ??  - Bile ducts: No intrahepatic biliary dilatation. No extrahepatic biliary  dilatation.   ??  - Gallbladder: Unremarkable.  ??  - Pancreas: No pancreatic ductal dilatation. Unremarkable.  ??  - Spleen: A few benign-appearing small splenic cysts are present.  ??  - Adrenals: Unremarkable.  ??  - Pelvic organs: A few fibroids are seen in the uterus. The ovaries are normal.  ??  - Bowel: No pneumatosis or mesenteric venous gas. No obstruction. Small  sliding-type hiatal hernia.   ??  - Vessels: Unremarkable.  ??  - Lymph nodes: No enlarged lymph nodes.  ??  - Peritoneum: No pneumoperitoneum or ascites.  ??  - Osseous structures: Unremarkable.    IMPRESSION  1.  No findings to explain gross hematuria. Study is of good quality with  complete opacification of the ureters and renal collecting system. The bladder  is partially filled with contrast on the delayed images. No stone or mass  identified.  2.  Fibroid uterus.??   3.  Small sliding-type hiatal hernia.    Urine Cytology 06/17/16- NEGATIVE FOR HIGH-GRADE UROTHELIAL CARCINOMA    Legrand Como B. Jimmye Norman, MD, [place]   Urologic Oncology  [facility] [place]    Associate Professor of Urology  Department of Urology  [facility] [place], [place]     Pager:  [phone]  Office:  [phone]        ICD-10-CM ICD-9-CM    1. 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 Gross hematuria R31.0 599.71        A copy of today's office visit with all pertinent imaging results and labs were sent to Albin Felling, MD for review.    Documentation provided by Augusto Garbe, medical scribe for Dr. Dulcy Fanny.

## 2016-07-23 ENCOUNTER — Ambulatory Visit: Payer: Medicare Other | Admitting: Emergency Medicine

## 2016-07-24 NOTE — Telephone Encounter (Signed)
Pt called into the office this morning. She stated that she has been urinating clear since her surgery w/ MBW, however she noticed blood in her urine last night.    Pt states that it started dark in color and has become much lighter. Denies having any other issues or complaints. States she can urinate with no problems.    Advised Pt that intermittent bleeding can be normal for a few weeks postop. Discussed with her to increase her fluid intake and to contact the office or go to the ER over the weekend if her urine stays dark red and/or she starts having any other symptoms, fever, nausea, vomiting, etc.    Pt voiced understanding. She is slightly concerned since she will be going to [place] on Monday for a week long work conference. Advised her that if she is gone for the week and starts having any of the above, she needs to go to an Urgent Care or ER. Pt voiced understanding and thanked me for my time and help.

## 2016-07-27 ENCOUNTER — Other Ambulatory Visit: Payer: Medicare Other | Admitting: *Deleted

## 2016-07-27 DIAGNOSIS — Z79899 Other long term (current) drug therapy: Secondary | ICD-10-CM | POA: Diagnosis not present

## 2016-07-27 DIAGNOSIS — E785 Hyperlipidemia, unspecified: Secondary | ICD-10-CM

## 2016-07-27 LAB — COMPREHENSIVE METABOLIC PANEL
ALT: 14 U/L (ref 6–29)
AST: 13 U/L (ref 10–35)
Albumin: 4.1 g/dL (ref 3.6–5.1)
Alkaline Phosphatase: 63 U/L (ref 33–130)
BUN: 12 mg/dL (ref 7–25)
CO2: 34 mmol/L — ABNORMAL HIGH (ref 20–31)
Calcium: 10.1 mg/dL (ref 8.6–10.4)
Chloride: 99 mmol/L (ref 98–110)
Creat: 0.85 mg/dL (ref 0.50–0.99)
Glucose, Bld: 80 mg/dL (ref 65–99)
Potassium: 4.4 mmol/L (ref 3.5–5.3)
Sodium: 140 mmol/L (ref 135–146)
Total Bilirubin: 0.4 mg/dL (ref 0.2–1.2)
Total Protein: 6.5 g/dL (ref 6.1–8.1)

## 2016-07-27 LAB — LIPID PANEL
Cholesterol: 131 mg/dL (ref 125–200)
HDL: 67 mg/dL (ref >=46)
LDL Cholesterol: 43 mg/dL (ref <130)
Total CHOL/HDL Ratio: 2 Ratio (ref <=5.0)
Triglycerides: 104 mg/dL (ref <150)
VLDL: 21 mg/dL (ref <30)

## 2016-07-29 ENCOUNTER — Ambulatory Visit: Payer: Medicare Other | Admitting: Cardiovascular Disease

## 2016-08-04 ENCOUNTER — Ambulatory Visit (INDEPENDENT_AMBULATORY_CARE_PROVIDER_SITE_OTHER): Payer: Medicare Other | Admitting: Family Medicine

## 2016-08-04 VITALS — BP 114/74 | HR 82

## 2016-08-04 DIAGNOSIS — E538 Deficiency of other specified B group vitamins: Secondary | ICD-10-CM | POA: Diagnosis not present

## 2016-08-04 MED ORDER — CYANOCOBALAMIN 1000 MCG/ML IJ SOLN
1000.0000 ug | Freq: Once | INTRAMUSCULAR | Status: AC
  Administered 2016-08-04: 1000 ug via INTRAMUSCULAR

## 2016-08-04 NOTE — Progress Notes (Signed)
Pt given B12 injection RUOQ without complication.

## 2016-08-04 NOTE — Progress Notes (Signed)
Agree with above.  Mia Marando, MD  

## 2016-08-10 ENCOUNTER — Other Ambulatory Visit: Payer: Self-pay | Admitting: Sports Medicine

## 2016-08-10 ENCOUNTER — Other Ambulatory Visit: Payer: Self-pay | Admitting: Family Medicine

## 2016-08-10 DIAGNOSIS — F0781 Postconcussional syndrome: Secondary | ICD-10-CM

## 2016-08-13 ENCOUNTER — Ambulatory Visit: Payer: Medicare Other | Admitting: Emergency Medicine

## 2016-08-25 ENCOUNTER — Ambulatory Visit
Admit: 2016-08-25 | Discharge: 2016-08-25 | Payer: PRIVATE HEALTH INSURANCE | Attending: Family | Primary: Internal Medicine

## 2016-08-25 DIAGNOSIS — R059 Cough, unspecified: Secondary | ICD-10-CM

## 2016-08-25 MED ORDER — NEOMYCIN-POLYMYXIN-HC 3.5 MG-10,000 UNIT/ML-1 % EAR DROPS, SUSP
3.5-10000-1 mg/mL-unit/mL-% | Freq: Four times a day (QID) | OTIC | 0 refills | Status: AC
Start: 2016-08-25 — End: 2016-09-04

## 2016-08-25 MED ORDER — AMOXICILLIN CLAVULANATE 875 MG-125 MG TAB
875-125 mg | ORAL_TABLET | Freq: Two times a day (BID) | ORAL | 0 refills | Status: AC
Start: 2016-08-25 — End: 2016-09-04

## 2016-08-25 NOTE — Patient Instructions (Addendum)
Cough: Care Instructions  Your Care Instructions    A cough is your body's response to something that bothers your throat or airways. Many things can cause a cough. You might cough because of a cold or the flu, bronchitis, or asthma. Smoking, postnasal drip, allergies, and stomach acid that backs up into your throat also can cause coughs.  A cough is a symptom, not a disease. Most coughs stop when the cause, such as a cold, goes away. You can take a few steps at home to cough less and feel better.  Follow-up care is a key part of your treatment and safety. Be sure to make and go to all appointments, and call your doctor if you are having problems. It's also a good idea to know your test results and keep a list of the medicines you take.  How can you care for yourself at home?  ?? Drink lots of water and other fluids. This helps thin the mucus and soothes a dry or sore throat. Honey or lemon juice in hot water or tea may ease a dry cough.  ?? Take cough medicine as directed by your doctor.  ?? Prop up your head on pillows to help you breathe and ease a dry cough.  ?? Try cough drops to soothe a dry or sore throat. Cough drops don't stop a cough. Medicine-flavored cough drops are no better than candy-flavored drops or hard candy.  ?? Do not smoke. Avoid secondhand smoke. If you need help quitting, talk to your doctor about stop-smoking programs and medicines. These can increase your chances of quitting for good.  When should you call for help?  Call 911 anytime you think you may need emergency care. For example, call if:  ? ?? You have severe trouble breathing.   ?Call your doctor now or seek immediate medical care if:  ? ?? You cough up blood.   ? ?? You have new or worse trouble breathing.   ? ?? You have a new or higher fever.   ? ?? You have a new rash.   ?Watch closely for changes in your health, and be sure to contact your doctor if:  ? ?? You cough more deeply or more often, especially if you notice more  mucus or a change in the color of your mucus.   ? ?? You have new symptoms, such as a sore throat, an earache, or sinus pain.   ? ?? You do not get better as expected.   Where can you learn more?  Go to [email or web address].  Enter D279 in the search box to learn more about "Cough: Care Instructions."  Current as of: Mar 06, 2016  Content Version: 11.4  ?? 2006-2017 [employer]. Care instructions adapted under license by [employer] (which disclaims liability or warranty for this information). If you have questions about a medical condition or this instruction, always ask your healthcare professional. [place] any warranty or liability for your use of this information.       Saline Nasal Washes: Care Instructions  Your Care Instructions  Saline nasal washes help keep the nasal passages open by washing out thick or dried mucus. This simple remedy can help relieve symptoms of allergies, sinusitis, and colds. It also can make the nose feel more comfortable by keeping the mucous membranes moist. You may notice a little burning sensation in your nose the first few times you use the solution, but this usually gets better in a few  days.  Follow-up care is a key part of your treatment and safety. Be sure to make and go to all appointments, and call your doctor if you are having problems. It's also a good idea to know your test results and keep a list of the medicines you take.  How can you care for yourself at home?  ?? You can buy premixed saline solution in a squeeze bottle or other sinus rinse products at a drugstore. Read and follow the instructions on the label.  ?? You also can make your own saline solution by adding 1 teaspoon of salt and 1 teaspoon of baking soda to 2 cups of distilled water.  ?? If you use a homemade solution, pour a small amount into a clean bowl. Using a rubber bulb syringe, squeeze the syringe and place the tip in the  salt water. Pull a small amount of the salt water into the syringe by relaxing your hand.  ?? Sit down with your head tilted slightly back. Do not lie down. Put the tip of the bulb syringe or the squeeze bottle a little way into one of your nostrils. Gently drip or squirt a few drops into the nostril. Repeat with the other nostril. Some sneezing and gagging are normal at first.  ?? Gently blow your nose.  ?? Wipe the syringe or bottle tip clean after each use.  ?? Repeat this 2 or 3 times a day.  ?? Use nasal washes gently if you have nosebleeds often.  When should you call for help?  Watch closely for changes in your health, and be sure to contact your doctor if:  ? ?? You often get nosebleeds.   ? ?? You have problems doing the nasal washes.   Where can you learn more?  Go to [email or web address].  Enter B784 in the search box to learn more about "Saline Nasal Washes: Care Instructions."  Current as of: Mar 06, 2016  Content Version: 11.4  ?? 2006-2017 [employer]. Care instructions adapted under license by [employer] (which disclaims liability or warranty for this information). If you have questions about a medical condition or this instruction, always ask your healthcare professional. [place] any warranty or liability for your use of this information.       Upper Respiratory Infection (Cold): Care Instructions  Your Care Instructions    An upper respiratory infection, or URI, is an infection of the nose, sinuses, or throat. URIs are spread by coughs, sneezes, and direct contact. The common cold is the most frequent kind of URI. The flu and sinus infections are other kinds of URIs.  Almost all URIs are caused by viruses. Antibiotics won't cure them. But you can treat most infections with home care. This may include drinking lots of fluids and taking over-the-counter pain medicine. You will probably feel better in 4 to 10 days.   The doctor has checked you carefully, but problems can develop later. If you notice any problems or new symptoms, get medical treatment right away.  Follow-up care is a key part of your treatment and safety. Be sure to make and go to all appointments, and call your doctor if you are having problems. It's also a good idea to know your test results and keep a list of the medicines you take.  How can you care for yourself at home?  ?? To prevent dehydration, drink plenty of fluids, enough so that your urine is light yellow or clear like water. Choose  water and other caffeine-free clear liquids until you feel better. If you have kidney, heart, or liver disease and have to limit fluids, talk with your doctor before you increase the amount of fluids you drink.  ?? Take an over-the-counter pain medicine, such as acetaminophen (Tylenol), ibuprofen (Advil, Motrin), or naproxen (Aleve). Read and follow all instructions on the label.  ?? Before you use cough and cold medicines, check the label. These medicines may not be safe for young children or for people with certain health problems.  ?? Be careful when taking over-the-counter cold or flu medicines and Tylenol at the same time. Many of these medicines have acetaminophen, which is Tylenol. Read the labels to make sure that you are not taking more than the recommended dose. Too much acetaminophen (Tylenol) can be harmful.  ?? Get plenty of rest.  ?? Do not smoke or allow others to smoke around you. If you need help quitting, talk to your doctor about stop-smoking programs and medicines. These can increase your chances of quitting for good.  When should you call for help?  Call 911 anytime you think you may need emergency care. For example, call if:  ? ?? You have severe trouble breathing.   ?Call your doctor now or seek immediate medical care if:  ? ?? You seem to be getting much sicker.   ? ?? You have new or worse trouble breathing.   ? ?? You have a new or higher fever.    ? ?? You have a new rash.   ?Watch closely for changes in your health, and be sure to contact your doctor if:  ? ?? You have a new symptom, such as a sore throat, an earache, or sinus pain.   ? ?? You cough more deeply or more often, especially if you notice more mucus or a change in the color of your mucus.   ? ?? You do not get better as expected.   Where can you learn more?  Go to [email or web address].  Enter [id number] in the search box to learn more about "Upper Respiratory Infection (Cold): Care Instructions."  Current as of: Mar 06, 2016  Content Version: 11.4  ?? 2006-2017 [employer]. Care instructions adapted under license by [employer] (which disclaims liability or warranty for this information). If you have questions about a medical condition or this instruction, always ask your healthcare professional. [place] any warranty or liability for your use of this information.   Amoxicillin/Clavulanate Potassium (Augmentin, Augmentin ES-600, Augmentin XR) - (By mouth)   Why this medicine is used:   Treats infections. This medicine is a penicillin antibiotic.  Contact a nurse or doctor right away if you have:  ?? Blistering, peeling, red skin rash  ?? Dark urine or pale stools, nausea, vomiting, loss of appetite, stomach pain, yellow eyes or skin  ?? Severe diarrhea, especially if bloody or ongoing     Common side effects:  ?? Diarrhea, nausea, vomiting  ?? Diaper rash  ?? Tooth discoloration (in children)  ?? 2017 [facility] Information is for End User's use only and may not be sold, redistributed or otherwise used for commercial purposes.  Loratadine (By mouth)   Loratadine (lor-A-ta-deen)  Treats allergy symptoms and hives.   Brand Name(s): Alavert, Allergy, Allergy Relief, Brite-Life Allergy Relief, Children's Claritin, Children's Claritin Allergy, Children's Claritin Chewables, Children's Loratadine, Children's Loratadine Allergy,  Claritin, Claritin 24HR, Claritin Liqui-Gels, Claritin RediTabs, [facility] Loratadine, [facility] Children's  Loratadine   There may be other brand names for this medicine.  When This Medicine Should Not Be Used:   You should not use this medicine if you have had an allergic reaction to loratadine. Do not give any over-the-counter (OTC) cough and cold medicine to a baby or child under 37 years old. Using these medicines in very young children might cause serious or possibly life-threatening side effects.  How to Use This Medicine:   Tablet, Dissolving Tablet, Liquid, Liquid Filled Capsule, Chewable Tablet  ?? Your doctor will tell you how much of this medicine to use and how often. Do not use more medicine or use it more often than your doctor tells you to.  ?? Follow the instructions on the medicine label if you are using this medicine without a prescription.  ?? After you remove a rapidly disintegrating tablet (Reditab??) from the package, use it right away by putting the tablet on your tongue. The tablet will break up and dissolve quickly. You may take the tablet with or without water.  ?? Measure the oral liquid medicine with a marked measuring spoon, oral syringe, or medicine cup.  If a dose is missed:   ?? If you miss a dose or forget to take your medicine, take it as soon as you can. If it is almost time for your next dose, wait until then to take the medicine and skip the missed dose.  ?? Do not use extra medicine to make up for a missed dose.  How to Store and Dispose of This Medicine:   ?? Store the medicine at room temperature, away from heat and direct light. Do not freeze.  ?? Ask your pharmacist, doctor, or health caregiver about the best way to dispose of any outdated medicine or medicine no longer needed.  ?? Keep all medicine out of the reach of children and never share your medicine with anyone.  Drugs and Foods to Avoid:       Ask your doctor or pharmacist before using any other medicine, including over-the-counter medicines, vitamins, and herbal products.      Warnings While Using This Medicine:   ?? Make sure your doctor knows if you are pregnant or breastfeeding, or if you have liver disease or kidney disease.  Possible Side Effects While Using This Medicine:   Call your doctor right away if you notice any of these side effects:  ?? Allergic reaction: Itching or hives, swelling in face or hands, swelling or tingling in the mouth or throat, tightness in chest, trouble breathing  ?? Extreme weakness  ?? Fast or irregular heartbeat  ?? Uncontrolled movements of the head, neck, eyes or tongue  If you notice these less serious side effects, talk with your doctor:   ?? Cough, sore throat, hoarseness  ?? Drowsiness  ?? Dry mouth  ?? Headache  ?? Nervousness  ?? Red, irritated eyes  If you notice other side effects that you think are caused by this medicine, tell your doctor.   Call your doctor for medical advice about side effects. You may report side effects to FDA at 1-800-FDA-1088  ?? 2017 [facility] Information is for End User's use only and may not be sold, redistributed or otherwise used for commercial purposes.  The above information is an educational aid only. It is not intended as medical advice for individual conditions or treatments. Talk to your doctor, nurse or pharmacist before following any medical regimen to see if it is safe  and effective for you.  Fluticasone (Into the nose)   Fluticasone (floo-TIK-a-sone)  Treats allergy symptoms, such as runny or stuffy nose. This medicine is a corticosteroid.   Brand Name(s): Children's Flonase, ClariSpray, DermacinRx [employer], DermacinRx [place], Flonase, Flonase Sensimist, Fluticasone Propionate Novaplus, Ticaspray, Veramyst   There may be other brand names for this medicine.  When This Medicine Should Not Be Used:    This medicine is not right for everyone. Do not use if you had an allergic reaction to fluticasone.  How to Use This Medicine:   Spray  ?? Your doctor will tell you how much medicine to use. Do not use more than directed.  ?? This medicine is for use only in the nose. Do not get any of it in your eyes or on your skin. If it does get on these areas, rinse it off right away.  ?? Prime the spray: Release 6 test sprays into the air away from the face, or pump the bottle until some of the medicine sprays out. Now it is ready to use. Prime the spray if it has not been used for more than 7 days (or 30 days for Veramyst??) or if the cap has been left off the bottle for 5 days or longer.  ?? Shake the medicine well just before each use.  ?? Before using the medicine, gently blow your nose to clear the nostrils.  ?? After using the nasal spray, wipe the tip of the bottle with a clean tissue and put the cap back on.  ?? You may need to use this medicine for a few days before you start to feel better.  ?? Read and follow the patient instructions that come with this medicine. Talk to your doctor or pharmacist if you have any questions.  ?? Follow the instructions on the medicine label if you are using this medicine without a prescription.  ?? Missed dose: Take a dose as soon as you remember. If it is almost time for your next dose, wait until then and take a regular dose. Do not take extra medicine to make up for a missed dose.  ?? Keep the bottle tightly closed when not using it. Store at room temperature, away from heat and direct light. Do not freeze or refrigerate. Throw this medicine away after you use 120 sprays.  Drugs and Foods to Avoid:   Ask your doctor or pharmacist before using any other medicine, including over-the-counter medicines, vitamins, and herbal products.  ?? Do not use this medicine together with ritonavir.  ?? Some foods and medicines can affect how fluticasone works. Tell your doctor if you are using ketoconazole.   Warnings While Using This Medicine:   ?? Tell your doctor if you are pregnant or breastfeeding, or if you have liver disease, asthma, an infection, or a history of cataracts or glaucoma. Make sure your doctor knows if you have had nose surgery, a nose injury, or a recent infection in your nose.  ?? This medicine may cause the following problems:  ?? Holes or ulcers inside the nose  ?? Slow wound healing  ?? Cataracts or glaucoma  ?? Problems with the adrenal glands  ?? Slow growth in children  ?? Avoid people who are sick or have infections. Tell your doctor right away if you think you have been exposed to measles or chickenpox.  ?? Your doctor will check your progress and the effects of this medicine at regular visits. Keep all appointments.  ??  Call your doctor if your symptoms do not improve or if they get worse.  ?? Keep all medicine out of the reach of children. Never share your medicine with anyone.  Possible Side Effects While Using This Medicine:   Call your doctor right away if you notice any of these side effects:  ?? Allergic reaction: Itching or hives, swelling in your face or hands, swelling or tingling in your mouth or throat, chest tightness, trouble breathing  ?? Burning, redness, swelling, or irritation around or inside your nose  ?? Eye pain or vision changes  ?? Fever, chills, cough, sore throat, and body aches  ?? Heavy nosebleeds  ?? Sores or white patches inside the nose or mouth  ?? Tiredness, weakness, dizziness  If you notice other side effects that you think are caused by this medicine, tell your doctor.   Call your doctor for medical advice about side effects. You may report side effects to FDA at 1-800-FDA-1088  ?? 2017 [facility] Information is for End User's use only and may not be sold, redistributed or otherwise used for commercial purposes.  The above information is an educational aid only. It is not intended as medical advice for individual conditions or treatments. Talk to your  doctor, nurse or pharmacist before following any medical regimen to see if it is safe and effective for you.       Swimmer's Ear: Care Instructions  Your Care Instructions    Swimmer's ear (otitis externa) is inflammation or infection of the ear canal. This is the passage that leads from the outer ear to the eardrum. Any water, sand, or other debris that gets into the ear canal and stays there can cause swimmer's ear. Putting cotton swabs or other items in the ear to clean it can also cause this problem.  Swimmer's ear can be very painful. But you can treat the pain and infection with medicines. You should feel better in a few days.  Follow-up care is a key part of your treatment and safety. Be sure to make and go to all appointments, and call your doctor if you are having problems. It's also a good idea to know your test results and keep a list of the medicines you take.  How can you care for yourself at home?  Cleaning and care  ?? Use antibiotic drops as your doctor directs.  ?? Do not insert ear drops (other than the antibiotic ear drops) or anything else into the ear unless your doctor has told you to.  ?? Avoid getting water in the ear until the problem clears up. Use cotton lightly coated with petroleum jelly as an earplug. Do not use plastic earplugs.  ?? Use a hair dryer set on low to carefully dry the ear after you shower.  ?? To ease ear pain, hold a warm washcloth against your ear.  ?? Take pain medicines exactly as directed.  ?? If the doctor gave you a prescription medicine for pain, take it as prescribed.  ?? If you are not taking a prescription pain medicine, ask your doctor if you can take an over-the-counter medicine.  Inserting ear drops  ?? Warm the drops to body temperature by rolling the container in your hands. Or you can place it in a cup of warm water for a few minutes.  ?? Lie down, with your ear facing up.  ?? Place drops inside the ear. Follow your doctor's instructions (or the  directions on the label)  for how many drops to use. Gently wiggle the outer ear or pull the ear up and back to help the drops get into the ear.  ?? It's important to keep the liquid in the ear canal for 3 to 5 minutes.  When should you call for help?  Call your doctor now or seek immediate medical care if:  ? ?? You have a new or higher fever.   ? ?? You have new or worse pain, swelling, warmth, or redness around or behind your ear.   ? ?? You have new or increasing pus or blood draining from your ear.   ?Watch closely for changes in your health, and be sure to contact your doctor if:  ? ?? You are not getting better after 2 days (48 hours).   Where can you learn more?  Go to [email or web address].  Enter C706 in the search box to learn more about "Swimmer's Ear: Care Instructions."  Current as of: Mar 06, 2016  Content Version: 11.4  ?? 2006-2017 [employer], [employer]. Care instructions adapted under license by [employer] (which disclaims liability or warranty for this information). If you have questions about a medical condition or this instruction, always ask your healthcare professional. [place] any warranty or liability for your use of this information.

## 2016-08-25 NOTE — Progress Notes (Signed)
Laura Roman is a 65 y.o. Caucasian female and presents with    Chief Complaint   Patient presents with   ??? Sinus Infection     Patien there for sinus infection, headache, runny nose, chest congestion.  Patient stated mustard yellow mucus coming out the nose x 8 days        Subjective:  HPI   Laura Roman presents with 8 days of upper respiratory symptoms that is worsening. Symptoms started with watery eyes, scratchy throat that progressed to sinus congestion with excessive rhinnorhea that has slowed down but turned a "mustardy yellow color", seems to be clearing, possible low grade fevers as patient reports feeling warm and eyes hot, cough productive, and vertigo. She is taking Mucinex OTC and Tylenol.     Additional Concerns: none     ROS   Review of Systems   Constitutional: Positive for fever. Negative for chills and malaise/fatigue.   HENT: Positive for congestion and ear pain. Negative for ear discharge, hearing loss, sinus pain, sore throat and tinnitus.    Eyes: Negative for blurred vision, pain, discharge and redness.   Respiratory: Positive for cough and sputum production. Negative for hemoptysis, shortness of breath and wheezing.    Cardiovascular: Negative for chest pain and palpitations.   Gastrointestinal: Negative.    Musculoskeletal: Negative for myalgias.   Skin: Negative for rash.   Neurological: Positive for dizziness. Negative for weakness and headaches.       Allergies   Allergen Reactions   ??? Fish Oil Nausea Only   ??? Relafen [Nabumetone] Nausea Only       Current Outpatient Prescriptions   Medication Sig Dispense Refill   ??? amoxicillin-clavulanate (AUGMENTIN) 875-125 mg per tablet Take 1 Tab by mouth two (2) times a day for 10 days. 20 Tab 0   ??? neomycin-polymyxin-hydrocortisone, buffered, (PEDIOTIC) 3.5-10,000-1 mg/mL-unit/mL-% otic suspension Administer 3 Drops in right ear four (4) times daily for 10 days. Indications: Otitis Externa 10 mL 0    ??? atorvastatin (LIPITOR) 40 mg tablet take 1 tablet by mouth once daily 90 Tab 3   ??? citalopram (CELEXA) 10 mg tablet take 1 tablet by mouth once daily 90 Tab 3   ??? lansoprazole (PREVACID) 30 mg capsule take 1 capsule by mouth once daily BEFORE BREAKFAST 90 Cap 3   ??? VITAMIN D2 50,000 unit capsule take 1 capsule by mouth every week 13 Cap 3   ??? omega-3 fatty acids (FISH OIL) cap Take 2 Tabs by mouth daily.     ??? multivitamin (ONE A DAY) tablet Take 1 Tab by mouth daily.     ??? aspirin 81 mg chewable tablet Take 81 mg by mouth daily.         Social History     Social History   ??? Marital status: MARRIED     Spouse name: N/A   ??? Number of children: 2   ??? 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 Smoking status: Never Smoker   ??? Smokeless tobacco: Never Used   ??? Alcohol use 2.4 oz/week     4 Glasses of wine per week   ??? Drug use: No   ??? Sexual activity: Not on file     Other Topics Concern   ??? Not on file     Social History Narrative       Past Medical History:  Diagnosis Date   ??? Allergic rhinitis    ??? Anxiety state, unspecified    ??? Arthritis     Dr. Synetta Shadow, Dr. Marisa Hua   ??? Compression fx, thoracic spine [facility] 2007    negative DEXA Dr. Marisa Hua   ??? Dyslipidemia    ??? Dyspepsia    ??? Frozen shoulder     right Dr. Wynonia Hazard MRI   ??? Left thyroid nodule 04/2016    1cm nodule; apparently noted on CT 2008 and unchanged   ??? Multiple lung nodules     no change 10/06, 03/07, 03/08   ??? Overweight (BMI 25.0-29.9)    ??? Palpitations 2008    neg thallium 2008, nl holter 2008, echo nl lv/ef 65%/tr mr/dd/nl pasp   ??? Prediabetes    ??? Syncope     neurocardiogenic by tilt 1994   ??? Venous insufficiency    ??? Vitamin D deficiency        Past Surgical History:   Procedure Laterality Date   ??? ECHO 2D ADULT  12/04    normal with EF 70%   ??? HX COLONOSCOPY      Dr Rosendo Gros 2007 negative   ??? HX GYN      s/p BTL    ??? HX HEMORRHOIDECTOMY      Dr. Rosendo Gros 2007    ??? STRESS TEST THALLIUM STUDY      2005 neg; 1/17 negative ef 75%   ??? [place] ABD COMP  9/05    negative   ??? VAS CAROTID DUPLEX BILATERAL  2004    negative       Family History   Problem Relation Age of Onset   ??? Diabetes Mother    ??? Elevated Lipids Father    ??? Stroke Paternal Grandmother    ??? Heart Disease Paternal Grandfather        Objective:  Vitals:    08/25/16 0814   BP: 134/78   Pulse: 81   Resp: 14   Temp: 97.8 ??F (36.6 ??C)   TempSrc: Oral   SpO2: 98%   Weight: 171 lb 9.6 oz (77.8 kg)   Height: 5\' 4"  (1.626 m)   PainSc:   0 - No pain       LABS   Results for orders placed or performed in visit on 07/08/16   URINE C&S   Result Value Ref Range    FINAL REPORT Microbiology results    AMB POC URINALYSIS DIP STICK AUTO W/O MICRO   Result Value Ref Range    Color (UA POC) Yellow     Clarity (UA POC) Clear     Glucose (UA POC) Negative Negative    Bilirubin (UA POC) Negative Negative    Ketones (UA POC) Negative Negative    Specific gravity (UA POC) 1.015 1.001 - 1.035    Blood (UA POC) Trace Negative    pH (UA POC) 7.0 4.6 - 8.0    Protein (UA POC) Negative Negative mg/dL    Urobilinogen (UA POC) 0.2 mg/dL 0.2 - 1    Nitrites (UA POC) Negative Negative    Leukocyte esterase (UA POC) 1+ Negative       TESTS  none    PE  Physical Exam   Constitutional: She is oriented to person, place, and time. She appears well-developed and well-nourished. No distress.   HENT:   Head: Normocephalic and atraumatic.   Left Ear: External ear normal.   Nose: Nose normal.   Mouth/Throat: No oropharyngeal exudate.  Dark fluid noted behind right TM, swelling to canal  Cobblestone appearance to oropharynx   Eyes: Conjunctivae are normal.   Cardiovascular: Normal rate, regular rhythm, normal heart sounds and intact distal pulses.    No murmur heard.  Pulmonary/Chest: Effort normal and breath sounds normal. No respiratory distress. She has no wheezes. She has no rales. She exhibits no tenderness.   Lymphadenopathy:      She has no cervical adenopathy.   Neurological: She is alert and oriented to person, place, and time.   Skin: Skin is warm and dry. No rash noted. She is not diaphoretic. No erythema. No pallor.   Psychiatric: She has a normal mood and affect. Her behavior is normal. Judgment and thought content normal.       Assessment/Plan:    1. Upper respiratory infection possible viral progressing to bacterial- Prescribed Augmentin x 10 days. Continue Mucinex and Tylenol. Symptomatic treatments. Follow up if symptoms worsen.     2. Otitis Externa- Pediotic gtts prescribed.     Lab review: no lab studies available for review at time of visit    Today's Visit:   Diagnoses and all orders for this visit:    1. Cough  -     amoxicillin-clavulanate (AUGMENTIN) 875-125 mg per tablet; Take 1 Tab by mouth two (2) times a day for 10 days.  -     neomycin-polymyxin-hydrocortisone, buffered, (PEDIOTIC) 3.5-10,000-1 mg/mL-unit/mL-% otic suspension; Administer 3 Drops in right ear four (4) times daily for 10 days. Indications: Otitis Externa    2. Rhinorrhea  -     amoxicillin-clavulanate (AUGMENTIN) 875-125 mg per tablet; Take 1 Tab by mouth two (2) times a day for 10 days.  -     neomycin-polymyxin-hydrocortisone, buffered, (PEDIOTIC) 3.5-10,000-1 mg/mL-unit/mL-% otic suspension; Administer 3 Drops in right ear four (4) times daily for 10 days. Indications: Otitis Externa    3. Sinus congestion  -     amoxicillin-clavulanate (AUGMENTIN) 875-125 mg per tablet; Take 1 Tab by mouth two (2) times a day for 10 days.  -     neomycin-polymyxin-hydrocortisone, buffered, (PEDIOTIC) 3.5-10,000-1 mg/mL-unit/mL-% otic suspension; Administer 3 Drops in right ear four (4) times daily for 10 days. Indications: Otitis Externa      Health Maintenance: Discussed Influenza Vaccine- she will wait until respiratory symptoms resolve     I have discussed the diagnosis with the patient and the intended plan as  seen in the above orders.  The patient has received an after-visit summary and questions were answered concerning future plans.  I have discussed medication side effects and warnings with the patient as well. I have reviewed the plan of care with the patient, accepted their input and they are in agreement with the treatment goals.       Follow-up Disposition:  Return if symptoms worsen or fail to improve.   More than 1/2 of this 15 minute visit was spent in counseling and coordination of care, as described above.    Wyline Beady, FNP-C  [facility]  [address] [place] [address]  Phone: [phone]  Fax: [phone]

## 2016-08-25 NOTE — Progress Notes (Signed)
1. Have you been to the ER, urgent care clinic or hospitalized since your last visit? NO.     2. Have you seen or consulted any other health care providers outside of the [facility] since your last visit (Include any pap smears or colon screening)? NO      Do you have an Advanced Directive? NO    Would you like information on Advanced Directives? NO

## 2016-08-26 ENCOUNTER — Telehealth: Payer: Self-pay

## 2016-08-26 NOTE — Telephone Encounter (Signed)
Pt left VM stating she's having a different pain in right shoulder that is going underneath the breast and it's becoming unbearable. Would like to know if she should see you for this concerns or her PCP. Please advise.

## 2016-08-26 NOTE — Telephone Encounter (Signed)
Maybe PCP first for chest pain, then to me if Dr. Madilyn Fireman thinks its Ortho

## 2016-08-28 ENCOUNTER — Encounter: Payer: Self-pay | Admitting: Cardiovascular Disease

## 2016-08-28 ENCOUNTER — Ambulatory Visit (INDEPENDENT_AMBULATORY_CARE_PROVIDER_SITE_OTHER): Payer: Medicare Other | Admitting: Cardiovascular Disease

## 2016-08-28 VITALS — BP 136/92 | HR 89 | Ht 66.0 in | Wt 178.8 lb

## 2016-08-28 DIAGNOSIS — E782 Mixed hyperlipidemia: Secondary | ICD-10-CM | POA: Diagnosis not present

## 2016-08-28 DIAGNOSIS — I471 Supraventricular tachycardia: Secondary | ICD-10-CM

## 2016-08-28 DIAGNOSIS — I10 Essential (primary) hypertension: Secondary | ICD-10-CM

## 2016-08-28 LAB — TSH: TSH: 0.75 mIU/L

## 2016-08-28 MED ORDER — PROPRANOLOL HCL 10 MG PO TABS: 10.0000 mg | ORAL_TABLET | Freq: Four times a day (QID) | ORAL | 6 refills | Status: DC | PRN

## 2016-08-28 MED ORDER — ATORVASTATIN CALCIUM 40 MG PO TABS: 40.0000 mg | ORAL_TABLET | Freq: Every day | ORAL | 3 refills | Status: DC

## 2016-08-28 MED ORDER — METOPROLOL TARTRATE 50 MG PO TABS: 75.0000 mg | ORAL_TABLET | Freq: Two times a day (BID) | ORAL | 3 refills | Status: DC

## 2016-08-28 NOTE — Patient Instructions (Signed)
Medication Instructions:  INCREASE Metoprolol (Lopressor) to 75 mg twice daily START Propranolol 10 mg up to 4 times per day as needed for palpitations/fast heart rate   Labwork: TODAY - TSH, cholesterol, complete metabolic panel   Testing/Procedures: None Ordered   Follow-Up: Your physician wants you to follow-up in: 6 months with Dr. Acie Fredrickson.  You will receive a reminder letter in the mail two months in advance. If you don't receive a letter, please call our office to schedule the follow-up appointment.   If you need a refill on your cardiac medications before your next appointment, please call your pharmacy.   Thank you for choosing [facility]! Christen Bame, RN [phone]

## 2016-08-28 NOTE — Progress Notes (Signed)
Cardiology Office Note   Date:  08/28/2016   ID:  Jaine Klingshirn Bovard, DOB 12-16-1950, MRN [id number]  PCP:  [name], MD  Cardiologist:   Mertie Moores, MD   Chief Complaint  Patient presents with  . Hyperlipidemia   1. SVT 2. Raynaud's Phenomenon 3. Diabetes Mellitus 4. Minimal CAD by cath 2006 [name]) 5. Sleep apnea - CPAP is currently not working  02/11/05 Right heart catheterization:  RA pressure: 8 mmHg  RV pressure: 25/7 mmHg  PA pressure: 20/9 mmHg  PWCP: 9 mmHg  CO: 4.6 liters per minute  LEFT HEART CATHETERIZATION RESULTS:  1. Left main: No significant disease.  2. LAD: Mild luminal irregularities.  3. First and second diagonal: Moderate size, mild luminal irregularities.  4. Left circumflex: Nondominant, mild luminal irregularities.  5. RCA: Dominant with mild luminal irregularities.  6. LV: EF is 55 to 60%, no wall motion abnormalities. LV EDP was 9 mmHg.   December 28, 2014:  Margrit Wigley Drier is a 65 y.o. female who presents for follow-up with an episode of chest pain. She was seen by our nurse practitioner, Ignacia Bayley.    Diltiazem was DC'd.   She was started on Metoprolol 50 bid.   Myoview study was normal. She has normal left ventricle systolic function. She is going to the [facility] 3 times a week.  Doing well.     She is feeling much better.    Sept. 1, 2016:  Has had C diff for the past several months .  Doing well from a cardiac standpoint .  Has been fatigued.  Doing well with the metoprolol  Still has shortness of breath .   Nov. 3, 2017:    Krystan is seen today with her husband, [name].   Last week she started having some heart facing .  HR was 187 (using the app on her phone )  Could feel it in her ears. Got a little dizzy.  Sat down and relaxed and the episode resolved.  Had another episode this past Sunday .  HR stayed elevated for 2 hours   Past Medical History:  Diagnosis Date  . Anal fissure   . Anemia   . Arthritis     . Blood transfusion   . C. difficile colitis   . Chest pain    a. 01/2013 MV: EF 59%, no ischemia.  . Chronic Dyspnea    a. 01/2013 Echo: EF 60-65%, Gr 1 DD, PASP 24mmHg.  . Diabetes mellitus   . Fibromyalgia   . Gall stones   . GERD (gastroesophageal reflux disease)    gastritis  . Hemorrhoids   . Hypertension   . Interstitial cystitis   . Memory difficulty 12/14/2013  . Neuromuscular disorder ([place])    sclerosis  . Osteoporosis   . Peptic ulcer   . PONV (postoperative nausea and vomiting)   . Pseudogout   . Raynaud phenomenon   . Rectal bleeding   . Sleep apnea    a. on cpap.  Marland Kitchen SVT (supraventricular tachycardia) ([place])   . Syncope 09/10/2015  . Thyroid disease    hypothyroidism  . Tremor, essential 05/14/2016    Past Surgical History:  Procedure Laterality Date  . APPENDECTOMY    . BLADDER SURGERY     x2  . BLADDER SURGERY    . BREAST BIOPSY    . CARDIAC CATHETERIZATION    . CATARACT EXTRACTION Bilateral   . CHOLECYSTECTOMY    . DILATION AND CURETTAGE OF  UTERUS    . ENTEROCELE REPAIR     x2  . EYE SURGERY     retina  . hysterectomy - unknown type    . KNEE SURGERY     x6  . NECK SURGERY     fusion  . OOPHORECTOMY    . QUADRICEPS REPAIR Right   . RECTOCELE REPAIR     x2  . SHOULDER ARTHROSCOPY WITH SUBACROMIAL DECOMPRESSION, ROTATOR CUFF REPAIR AND BICEP TENDON REPAIR  10/06/2012   Procedure: SHOULDER ARTHROSCOPY WITH SUBACROMIAL DECOMPRESSION, ROTATOR CUFF REPAIR AND BICEP TENDON REPAIR;  Surgeon: Nita Sells, MD;  Location: [facility];  Service: Orthopedics;  Laterality: Right;  Arthroscopic  Repair  of  Subscapularis, Open Biceps Tenodesis  . SHOULDER SURGERY     bilateral- bones spur  . TONSILLECTOMY    . TOTAL KNEE ARTHROPLASTY       Current Outpatient Prescriptions  Medication Sig Dispense Refill  . alendronate (FOSAMAX) 70 MG tablet Take 1 tablet by mouth  every 7 days with a full  glass of water on an empty  stomach  12 tablet 4  . amitriptyline (ELAVIL) 25 MG tablet TAKE 1 TABLET BY MOUTH AT  BEDTIME 30 tablet 2  . atorvastatin (LIPITOR) 40 MG tablet Take 1 tablet by mouth  daily 90 tablet 0  . calcium carbonate (OS-CAL) 600 MG TABS Take 600 mg by mouth daily with breakfast.     . cetirizine (ZYRTEC) 10 MG tablet Take 10 mg by mouth as needed for allergies.    Marland Kitchen donepezil (ARICEPT) 5 MG tablet Take 1 tablet by mouth at  bedtime 90 tablet 3  . fluticasone (FLONASE) 50 MCG/ACT nasal spray Place into both nostrils as needed for allergies or rhinitis.    . furosemide (LASIX) 20 MG tablet Take 1 tablet by mouth  daily 90 tablet 0  . gabapentin (NEURONTIN) 300 MG capsule TAKE 1 TO 2 CAPSULES BY  MOUTH EVERY NIGHT AT  BEDTIME TO TWO TIMES DAILY 90 capsule 3  . glucose blood test strip Use to test blood sugar once daily. Dx:R73.01 100 each 12  . ipratropium (ATROVENT) 0.03 % nasal spray Place 2 sprays into both nostrils every 12 (twelve) hours as needed for rhinitis.    Marland Kitchen leflunomide (ARAVA) 20 MG tablet Take 1 tablet by mouth daily.  2  . metFORMIN (GLUCOPHAGE) 500 MG tablet TAKE 1 TABLET BY MOUTH  TWICE DAILY WITH MEALS 180 tablet 1  . methocarbamol (ROBAXIN) 500 MG tablet Take 500 mg by mouth 3 (three) times daily as needed for muscle spasms.    . metoprolol (LOPRESSOR) 50 MG tablet Take 1 tablet by mouth two  times daily 180 tablet 0  . Multiple Vitamins-Minerals (ZINC PO) Take 1 capsule by mouth daily.    . NON FORMULARY Place into the nose at bedtime. cpap machine    . ondansetron (ZOFRAN-ODT) 4 MG disintegrating tablet Take 4 mg by mouth every 6 (six) hours as needed for nausea or vomiting.    . pantoprazole (PROTONIX) 40 MG tablet Take 1 tablet by mouth two  times daily 180 tablet 0  . primidone (MYSOLINE) 50 MG tablet Take 1 tablet by mouth at  bedtime 90 tablet 3  . SUMAtriptan (IMITREX) 50 MG tablet Take 1 tablet (50 mg total) by mouth every 2 (two) hours as needed. 9 tablet 6  . venlafaxine XR  (EFFEXOR-XR) 75 MG 24 hr capsule Take 1 capsule by mouth  daily with breakfast  90 capsule 3  . vitamin C (ASCORBIC ACID) 500 MG tablet Take 500 mg by mouth daily.    . vitamin D, CHOLECALCIFEROL, 400 UNITS tablet Take 400 Units by mouth daily.      No current facility-administered medications for this visit.     Allergies:   Myrbetriq  [mirabegron]; Percocet [oxycodone-acetaminophen]; Celebrex [celecoxib]; Codeine; Darvocet [propoxyphene n-acetaminophen]; Erythromycin; Hydrocodone; Lyrica [pregabalin]; Macrodantin [nitrofurantoin]; Toradol [ketorolac tromethamine]; Tramadol; and Verapamil    Social History:  The patient  reports that she has never smoked. She has never used smokeless tobacco. She reports that she does not drink alcohol or use drugs.   Family History:  The patient's family history includes Arthritis/Rheumatoid in her sister; Asthma in her sister; Breast cancer in her maternal grandmother; Colon polyps in her mother and sister; Dementia in her sister; Diabetes in her mother; Heart attack in her father and mother; Heart disease in her father and mother; Irritable bowel syndrome in her sister; Lupus in her sister; Motor neuron disease in her sister; Pancreatic cancer in her maternal grandfather; Stroke in her mother and sister; Wilson's disease in her maternal grandmother.    ROS:  Please see the history of present illness.    Review of Systems: Constitutional:  denies fever, chills, diaphoresis, appetite change and fatigue.  HEENT: denies photophobia, eye pain, redness, hearing loss, ear pain, congestion, sore throat, rhinorrhea, sneezing, neck pain, neck stiffness and tinnitus.  Respiratory: denies SOB, DOE, cough, chest tightness, and wheezing.  Cardiovascular: denies chest pain, palpitations and leg swelling.  Gastrointestinal: denies nausea, vomiting, abdominal pain, diarrhea, constipation, blood in stool.  Genitourinary: denies dysuria, urgency, frequency, hematuria, flank  pain and difficulty urinating.  Musculoskeletal: denies  myalgias, back pain, joint swelling, arthralgias and gait problem.   Skin: denies pallor, rash and wound.  Neurological: denies dizziness, seizures, syncope, weakness, light-headedness, numbness and headaches.   Hematological: denies adenopathy, easy bruising, personal or family bleeding history.  Psychiatric/ Behavioral: denies suicidal ideation, mood changes, confusion, nervousness, sleep disturbance and agitation.       All other systems are reviewed and negative.    PHYSICAL EXAM: VS:  BP (!) 136/92   Pulse 89   Ht 5\' 6"  (1.676 m)   Wt 178 lb 12.8 oz (81.1 kg)   BMI 28.86 kg/m  , BMI Body mass index is 28.86 kg/m. GEN: Well nourished, well developed, in no acute distress  HEENT: normal  Neck: no JVD, carotid bruits, or masses Cardiac: RRR; no murmurs, rubs, or gallops,no edema  Respiratory:  clear to auscultation bilaterally, normal work of breathing GI: soft, nontender, nondistended, + BS MS: no deformity or atrophy  Skin: warm and dry, no rash Neuro:  Strength and sensation are intact Psych: normal   EKG:  EKG is ordered today.   Nov. 3 ,2017;  NSR at 89. Normal ECG   Recent Labs: 09/09/2015: Magnesium 1.8 01/23/2016: TSH 0.62 04/17/2016: Hemoglobin 12.8; Platelets 354 07/27/2016: ALT 14; BUN 12; Creat 0.85; Potassium 4.4; Sodium 140    Lipid Panel    Component Value Date/Time   CHOL 131 07/27/2016 0843   TRIG 104 07/27/2016 0843   HDL 67 07/27/2016 0843   CHOLHDL 2.0 07/27/2016 0843   VLDL 21 07/27/2016 0843   LDLCALC 43 07/27/2016 0843      Wt Readings from Last 3 Encounters:  08/28/16 178 lb 12.8 oz (81.1 kg)  07/20/16 176 lb 14.4 oz (80.2 kg)  07/06/16 177 lb (80.3 kg)  Other studies Reviewed: Additional studies/ records that were reviewed today include: . Review of the above records demonstrates:    ASSESSMENT AND PLAN:  1. SVT -  Has had several episodes of tachycardia That are  probably supraventricular tachycardia. Her heart rate is a little fast a day-89 which is higher than what we would expect on metoprolol 50 mg twice a day. We'll check a TSH today. I'll give her a prescription for propranolol 10 mg tablets to take if she has further episodes of SVT. We discussed the Valsalva maneuver and the diving reflex.  3. Diabetes Mellitus  4. Minimal CAD by cath 2006 [name])  5. Sleep apnea - CPAP is currently not working  6. Dyspnea on exertion:   she continues to have dyspnea on exertion. Her echocardiogram and her Myoview study were normal.  She was found atelectasis by CT scan. I've encouraged her to take deep breaths. She may need to have an appointment with pulmonologist.  Current medicines are reviewed at length with the patient today.  The patient does not have concerns regarding medicines.  The following changes have been made:  no change  Disposition:   FU with me in 6  months     Mertie Moores, MD  08/28/2016 3:26 PM    [address], [place], [place]  [address] Phone: [phone]; Fax: [phone]

## 2016-08-29 LAB — LIPID PANEL
Cholesterol: 133 mg/dL (ref 125–200)
HDL: 41 mg/dL — ABNORMAL LOW (ref >=46)
LDL Cholesterol: 70 mg/dL (ref <130)
Total CHOL/HDL Ratio: 3.2 Ratio (ref <=5.0)
Triglycerides: 110 mg/dL (ref <150)
VLDL: 22 mg/dL (ref <30)

## 2016-08-29 LAB — COMPREHENSIVE METABOLIC PANEL
ALT: 21 U/L (ref 6–29)
AST: 23 U/L (ref 10–35)
Albumin: 4.2 g/dL (ref 3.6–5.1)
Alkaline Phosphatase: 84 U/L (ref 33–130)
BUN: 9 mg/dL (ref 7–25)
CO2: 27 mmol/L (ref 20–31)
Calcium: 9.7 mg/dL (ref 8.6–10.4)
Chloride: 98 mmol/L (ref 98–110)
Creat: 0.8 mg/dL (ref 0.50–0.99)
Glucose, Bld: 100 mg/dL — ABNORMAL HIGH (ref 65–99)
Potassium: 3.9 mmol/L (ref 3.5–5.3)
Sodium: 137 mmol/L (ref 135–146)
Total Bilirubin: 0.3 mg/dL (ref 0.2–1.2)
Total Protein: 7 g/dL (ref 6.1–8.1)

## 2016-09-01 ENCOUNTER — Telehealth: Payer: Self-pay | Admitting: Emergency Medicine

## 2016-09-01 ENCOUNTER — Encounter: Payer: Self-pay | Admitting: Emergency Medicine

## 2016-09-01 ENCOUNTER — Ambulatory Visit (INDEPENDENT_AMBULATORY_CARE_PROVIDER_SITE_OTHER): Payer: Medicare Other | Admitting: Emergency Medicine

## 2016-09-01 DIAGNOSIS — G4733 Obstructive sleep apnea (adult) (pediatric): Secondary | ICD-10-CM

## 2016-09-01 NOTE — Assessment & Plan Note (Signed)
Please follow up with [facility] regarding your chin strap and your CPAP device / reservoir interface.  We will obtain download data from your CPAP for our documentation.  Follow with Dr Lamonte Sakai in 12 months or sooner if you have any problems

## 2016-09-01 NOTE — Telephone Encounter (Signed)
Pt states during today's visit with RB, placing an order for a new cpap was discussed. I see where a download was requested, however I do not see where a new cpap machine was mentioned.  RB please advise. Thanks.

## 2016-09-01 NOTE — Progress Notes (Signed)
History of Present Illness:  65 yo woman never smoker with hx paroxismal SVT, Holter showed correlation at the time with exertional dyspnea and lightheadedness. PFT, methacholine and CPEX were reassuring except exercise limited by tachycardia. Has undergone CPAP titration and needs 14cm H2O pressure   ROV 07/30/09 -- Not using nasal pillows, using the nasal mask. Has been switched to auto-titration device due to difficulty tolerating 14cm H2O. Has had better compliance with latest mask. Wears it every night, When she wakes up in the middle of the night she takes it off. Can't really tell any difference in how she feels during the day - does have less fibromyalgia pain since she's been using it.   January 10, 2010--Presents for acute office visit. Complains of occ pain in left upper back wrapping around to the left breast when she takes a deep breath x8weeks. Happens intermittently, sore to touch, pain is under shoulder blade w/ radiation around to rbs. Use muscle relaxer patch did not help, no other meds used. Denies chest pain, dyspnea, orthopnea, hemoptysis, fever, n/v/d, edema, headache,recent travel or antibiotics.   ROV 08/25/10 -- Hx OSA and HTN. Was started on diltiazem about 6 weeks ago. Began to develop B LE edema, L>R, about 2 weeks ago. US showed no DVT, CT scan chest done, no PE showed "? PNA" so treated with levaquin. She had absolutely no symptoms at that time. She has been having some exertional dyspnea, occasionally feels her tachycardia, not sure the two correlate. Tells me she hasn't worn her CPAP in about 4 months because her mask is hurting her face.   ROV 09/04/10 -- Returns to f/u her edema, exertional SOB. I performed 24h urine, TTE last time - both are reassuring. She has not been wearing her CPAP mask reliably, needs a new mask.   ROV 02/08/13 -- return visit with hx OSA (on CPAP), HTN, SVT, exertional dyspnea s/p extensive w/u (negative methacholine, reassuring CPST. She was seen  by Dr Acie Fredrickson 01/24/13 for similar sx, tachycardia. Underwent TTE and stress testing >> normal. Her HR and BP were high. She has been off of CPAP for two years. She started back 2 weeks ago. Just got a new full face mask > having leakage problem. [employer] is the company. She says she is on auto-set, not 14cm H2O.   ROV 06/25/14 -- follows up for chronic dyspnea (w/u as above), OSA. She has been having trouble with R knee pain, R great toe pain. She has has hx pseudogout. She has been wearing her CPAP very rarely. She has the most difficulty trying to go to sleep, often gets waked up by a leak. She had spirometry 06/20/14 >> normal AF's, ? Small airways responsiveness. Her methacholine has previously been negative.    ROV 09/18/14 -- follow up visit for OSA and dyspnea with reassuring spirometry. 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 She has gotten a new CPAP and new mask > has been wearing reliably. She was seen in cardiology 11/13 for continued dyspnea, was noted to have exertional tachycardia. He is planning to repeat a stress test in the future. She tells me that she has been wearing her C Pap reliably averaging about 6 hours a night. She feels that it is helped her and is having less daytime sleepiness. A recent download from her device from 10/24-11/22/15 showed that she used her device for greater than 4 hours 77% of the time.   ROV 3//9/17 -- follow-up visit for obstructive sleep apnea. She  has a CPAP download available between December 7 and March 6. This shows usage on 14% of days only.  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 She has been sleeping on couch, had a huge change in her sleep habits in the time reflected in the download above. She tells me also that she has had 2 syncopal episodes in November '16. Seen by neuro - no evidence seizures. Cardiology performed rhythm monitor that was reassuring.   ROV 04/14/16 -- patient has  a history of obstructive sleep apnea, working to achieve better compliance with CPAP. As noted she had been having difficulty using. We have a download available today wean May and June that showed 87% compliance with 87% usage greater than 4 hours. The only time she didn't use it was when she was at a conference out of town. She feels definite benefits. She was seen by PCP last month for possible asthma flare > treated w abx and pred and BD's. She feels benefit from the BD. Her spiro in 2015 had a BD response on the 25-75%, ? Mild asthma.   ROV 05/20/16 -- Follow-up visit for patient with a history of obstructive sleep apnea on CPAP. A compliance download is available today. This shows that she's used the device for greater than 4 hours on 87% of the nights. She is on AutoSet device between 5 and 20 cm water. There has been some question of possibility of obstructive disease contributing to Chronic dyspnea on exertion. Methacholine challenge was done on 04/23/16. This showed normal spirometry without any evidence for airways hyperresponsiveness on the methacholine portion of the test. She has been working on increasing her exercise. She feels that her energy is better w CPAP, less fatigue. More dreaming.    ROV 09/01/16 -- this is a follow-up visit for obstructive sleep apnea compliant with CPAP. Also for dyspnea. She has ben seen by Dr Acie Fredrickson for SVT recently. He increased her metoprolol, also started propranolol prn. She underwent CPST 05/29/16 to further evaluate her dyspnea. I have reviewed the results. This showed normal cardiac and pulmonary responses to exercise. The test was most consistent with deconditioning. She reports that she is using her CPAP reliably until the last 2 nights > the machine has a leak where the reservoir connects. She needs to discuss with [employer]. Before this problem she was compliant, was benefiting from it. Husband reports some snoring, ? Whether her chin strap is adequate.    Vitals:    09/01/16 1052  BP: 118/60  BP Location: Left Arm  Cuff Size: Normal  Pulse: 76  SpO2: 93%  Weight: 178 lb 6.4 oz (80.9 kg)  Height: 5\' 6"  (1.676 m)   Gen: Pleasant, well-nourished, in no distress,  normal affect  ENT: No lesions,  mouth clear,  oropharynx clear, no postnasal drip  Neck: No JVD, no TMG, no carotid bruits  Lungs: No use of accessory muscles, no dullness to percussion, clear without rales or rhonch.  Cardiovascular: RRR, heart sounds normal, no murmur or gallops, no peripheral edema  Musculoskeletal: No deformities, no cyanosis or clubbing  Neuro: alert, non focal  Skin: Warm, no lesions or rashes    Cardiac Stress Test 02/01/13: Impression  Exercise Capacity: Lexiscan with low level exercise.  BP Response: Normal blood pressure response.  Clinical Symptoms: There is dyspnea.  ECG Impression: No significant ST segment change suggestive of ischemia.  Comparison with Prior Nuclear  Study: No significant change from previous study  Overall Impression: Normal stress nuclear study. No evidence of ischemia. Heart rate accelerated very rapidly on walking lexiscan protocol.  LV Ejection Fraction: 59%. LV Wall Motion: NL LV Function; NL Wall Motion  OSA (obstructive sleep apnea) Please follow up with [facility] regarding your chin strap and your CPAP device / reservoir interface.  We will obtain download data from your CPAP for our documentation.  Follow with Dr Lamonte Sakai in 12 months or sooner if you have any problems    Baltazar Apo, MD, PhD 09/01/2016, 11:12 AM [facility] [phone] or if no answer [phone]

## 2016-09-01 NOTE — Patient Instructions (Addendum)
Please follow up with [facility] regarding your chin strap and your CPAP device / reservoir interface.  We will obtain download data from your CPAP for our documentation.  Follow with Dr Lamonte Sakai in 12 months or sooner if you have any problems

## 2016-09-01 NOTE — Telephone Encounter (Signed)
Order has been placed for DME---[facility] to do a maintenance check on her cpap.  Pt stated that this is not working.    Called and spoke with pt and she is aware and nothing further is needed.

## 2016-09-01 NOTE — Telephone Encounter (Signed)
She needs s maintenance eval of her device > it isnt working. I suspect that ultimately they will decide she needs a new machine.

## 2016-09-03 ENCOUNTER — Encounter

## 2016-09-03 MED ORDER — LANSOPRAZOLE 30 MG CAP, DELAYED RELEASE
30 mg | ORAL_CAPSULE | ORAL | 3 refills | Status: DC
Start: 2016-09-03 — End: 2017-11-23

## 2016-09-04 ENCOUNTER — Ambulatory Visit (INDEPENDENT_AMBULATORY_CARE_PROVIDER_SITE_OTHER): Payer: Medicare Other | Admitting: Physician Assistant

## 2016-09-04 ENCOUNTER — Encounter: Payer: Self-pay | Admitting: Physician Assistant

## 2016-09-04 ENCOUNTER — Other Ambulatory Visit: Payer: Self-pay

## 2016-09-04 ENCOUNTER — Telehealth: Payer: Self-pay

## 2016-09-04 ENCOUNTER — Other Ambulatory Visit: Payer: Self-pay | Admitting: Rheumatology

## 2016-09-04 VITALS — BP 156/83 | HR 99

## 2016-09-04 DIAGNOSIS — Z79899 Other long term (current) drug therapy: Secondary | ICD-10-CM | POA: Diagnosis not present

## 2016-09-04 DIAGNOSIS — E538 Deficiency of other specified B group vitamins: Secondary | ICD-10-CM

## 2016-09-04 DIAGNOSIS — I10 Essential (primary) hypertension: Secondary | ICD-10-CM

## 2016-09-04 MED ORDER — METFORMIN HCL 500 MG PO TABS: 500.0000 mg | ORAL_TABLET | Freq: Two times a day (BID) | ORAL | 0 refills | Status: DC

## 2016-09-04 MED ORDER — CYANOCOBALAMIN 1000 MCG/ML IJ SOLN
1000.0000 ug | Freq: Once | INTRAMUSCULAR | Status: AC
  Administered 2016-09-04: 1000 ug via INTRAMUSCULAR

## 2016-09-04 MED ORDER — FUROSEMIDE 20 MG PO TABS: 20.0000 mg | ORAL_TABLET | Freq: Every day | ORAL | 0 refills | Status: DC

## 2016-09-04 MED ORDER — GABAPENTIN 300 MG PO CAPS: ORAL_CAPSULE | ORAL | 3 refills | Status: DC

## 2016-09-04 MED ORDER — PANTOPRAZOLE SODIUM 40 MG PO TBEC: 40.0000 mg | DELAYED_RELEASE_TABLET | Freq: Two times a day (BID) | ORAL | 1 refills | Status: DC

## 2016-09-04 MED ORDER — PRIMIDONE 50 MG PO TABS: 50.0000 mg | ORAL_TABLET | Freq: Every day | ORAL | 0 refills | Status: DC

## 2016-09-04 MED ORDER — ALENDRONATE SODIUM 70 MG PO TABS: ORAL_TABLET | ORAL | 4 refills | Status: DC

## 2016-09-04 NOTE — Telephone Encounter (Signed)
Pt wants to know is amitriptyline a long or short term medication that she'll taking? Please advise. Pt is aware that your out of the office and will return on Monday.

## 2016-09-04 NOTE — Progress Notes (Signed)
Pt is here for monthly b 12 injection. Pt tolerated well in the LUQO.

## 2016-09-05 LAB — COMPLETE METABOLIC PANEL WITH GFR
ALT: 20 U/L (ref 6–29)
AST: 21 U/L (ref 10–35)
Albumin: 4.1 g/dL (ref 3.6–5.1)
Alkaline Phosphatase: 85 U/L (ref 33–130)
BUN: 14 mg/dL (ref 7–25)
CO2: 27 mmol/L (ref 20–31)
Calcium: 10.1 mg/dL (ref 8.6–10.4)
Chloride: 100 mmol/L (ref 98–110)
Creat: 0.74 mg/dL (ref 0.50–0.99)
GFR, Est African American: >89 mL/min (ref >=60)
GFR, Est Non African American: 85 mL/min (ref >=60)
Glucose, Bld: 115 mg/dL — ABNORMAL HIGH (ref 65–99)
Potassium: 4.1 mmol/L (ref 3.5–5.3)
Sodium: 137 mmol/L (ref 135–146)
Total Bilirubin: 0.2 mg/dL (ref 0.2–1.2)
Total Protein: 6.8 g/dL (ref 6.1–8.1)

## 2016-09-05 LAB — CBC WITH DIFFERENTIAL/PLATELET
Basophils Absolute: 74 cells/uL (ref 0–200)
Basophils Relative: 1 %
Eosinophils Absolute: 222 cells/uL (ref 15–500)
Eosinophils Relative: 3 %
HCT: 39.8 % (ref 35.0–45.0)
Hemoglobin: 12.8 g/dL (ref 11.7–15.5)
Lymphocytes Relative: 30 %
Lymphs Abs: 2220 cells/uL (ref 850–3900)
MCH: 30.4 pg (ref 27.0–33.0)
MCHC: 32.2 g/dL (ref 32.0–36.0)
MCV: 94.5 fL (ref 80.0–100.0)
MPV: 10.3 fL (ref 7.5–12.5)
Monocytes Absolute: 740 cells/uL (ref 200–950)
Monocytes Relative: 10 %
Neutro Abs: 4144 cells/uL (ref 1500–7800)
Neutrophils Relative %: 56 %
Platelets: 398 10*3/uL (ref 140–400)
RBC: 4.21 MIL/uL (ref 3.80–5.10)
RDW: 14.1 % (ref 11.0–15.0)
WBC: 7.4 10*3/uL (ref 3.8–10.8)

## 2016-09-07 NOTE — Telephone Encounter (Signed)
Pt.notified

## 2016-09-07 NOTE — Telephone Encounter (Signed)
Hopefully short-term for a few months. That is the plan but certainly can be used long-term if it's effective in working well.

## 2016-09-07 NOTE — Progress Notes (Signed)
Labs normal.

## 2016-09-08 DIAGNOSIS — N3941 Urge incontinence: Secondary | ICD-10-CM | POA: Diagnosis not present

## 2016-09-08 DIAGNOSIS — R102 Pelvic and perineal pain: Secondary | ICD-10-CM | POA: Diagnosis not present

## 2016-09-08 DIAGNOSIS — N3281 Overactive bladder: Secondary | ICD-10-CM | POA: Diagnosis not present

## 2016-09-08 DIAGNOSIS — N3010 Interstitial cystitis (chronic) without hematuria: Secondary | ICD-10-CM | POA: Diagnosis not present

## 2016-09-09 ENCOUNTER — Telehealth: Payer: Self-pay | Admitting: Internal Medicine

## 2016-09-09 ENCOUNTER — Other Ambulatory Visit: Payer: Self-pay

## 2016-09-09 MED ORDER — GABAPENTIN 300 MG PO CAPS: ORAL_CAPSULE | ORAL | 3 refills | Status: DC

## 2016-09-09 NOTE — Telephone Encounter (Signed)
Received referral to schedule patient an appointment for Rectal Sphincter dysunction. Patient last saw Dr. Henrene Pastor in early 2016 and has been seen by [facility] 06/2015.  Patient states that she went to [facility] because our office could not "clear up" her problem with C-Diff. She wants to come back to our office because of location. Records placed on Dr. Blanch Media desk for review.

## 2016-09-14 NOTE — Telephone Encounter (Signed)
Dr. Perry reviewed records and has declined to accept patient. Left message for patient to return my call °

## 2016-09-14 NOTE — Telephone Encounter (Signed)
Patient returned phone call and is aware of this.

## 2016-09-25 ENCOUNTER — Other Ambulatory Visit: Payer: Self-pay | Admitting: Rheumatology

## 2016-09-25 ENCOUNTER — Other Ambulatory Visit: Payer: Self-pay | Admitting: Cardiovascular Disease

## 2016-09-25 DIAGNOSIS — I10 Essential (primary) hypertension: Secondary | ICD-10-CM

## 2016-09-28 ENCOUNTER — Other Ambulatory Visit: Payer: Self-pay | Admitting: Cardiovascular Disease

## 2016-09-28 DIAGNOSIS — I10 Essential (primary) hypertension: Secondary | ICD-10-CM

## 2016-09-28 MED ORDER — PANTOPRAZOLE SODIUM 40 MG PO TBEC
40.0000 mg | DELAYED_RELEASE_TABLET | Freq: Two times a day (BID) | ORAL | 3 refills | Status: DC
Start: 2016-09-28 — End: 2017-02-23

## 2016-09-28 MED ORDER — FUROSEMIDE 20 MG PO TABS: 20.0000 mg | ORAL_TABLET | Freq: Every day | ORAL | 3 refills | Status: DC

## 2016-09-28 NOTE — Telephone Encounter (Signed)
Last Visit: 03/31/16 Next Visit: due December 201. Message sent to front desk to schedule patient for follow up. Labs: 09/04/16 WNL  Okay to refill Arava?

## 2016-09-30 ENCOUNTER — Encounter: Payer: Self-pay | Admitting: Rheumatology

## 2016-09-30 ENCOUNTER — Ambulatory Visit (INDEPENDENT_AMBULATORY_CARE_PROVIDER_SITE_OTHER): Payer: Medicare Other | Admitting: Rheumatology

## 2016-09-30 ENCOUNTER — Ambulatory Visit: Payer: Medicare Other | Admitting: Rheumatology

## 2016-09-30 ENCOUNTER — Telehealth: Payer: Self-pay | Admitting: *Deleted

## 2016-09-30 VITALS — BP 128/80 | HR 88 | Resp 16 | Ht 66.0 in | Wt 180.0 lb

## 2016-09-30 DIAGNOSIS — M7061 Trochanteric bursitis, right hip: Secondary | ICD-10-CM

## 2016-09-30 DIAGNOSIS — G1223 Primary lateral sclerosis: Secondary | ICD-10-CM

## 2016-09-30 DIAGNOSIS — M0609 Rheumatoid arthritis without rheumatoid factor, multiple sites: Secondary | ICD-10-CM | POA: Diagnosis not present

## 2016-09-30 DIAGNOSIS — M1120 Other chondrocalcinosis, unspecified site: Secondary | ICD-10-CM | POA: Diagnosis not present

## 2016-09-30 DIAGNOSIS — Z8619 Personal history of other infectious and parasitic diseases: Secondary | ICD-10-CM | POA: Diagnosis not present

## 2016-09-30 DIAGNOSIS — M797 Fibromyalgia: Secondary | ICD-10-CM

## 2016-09-30 DIAGNOSIS — R5382 Chronic fatigue, unspecified: Secondary | ICD-10-CM

## 2016-09-30 DIAGNOSIS — Z96651 Presence of right artificial knee joint: Secondary | ICD-10-CM

## 2016-09-30 DIAGNOSIS — M47814 Spondylosis without myelopathy or radiculopathy, thoracic region: Secondary | ICD-10-CM | POA: Diagnosis not present

## 2016-09-30 DIAGNOSIS — M810 Age-related osteoporosis without current pathological fracture: Secondary | ICD-10-CM | POA: Diagnosis not present

## 2016-09-30 DIAGNOSIS — I471 Supraventricular tachycardia: Secondary | ICD-10-CM

## 2016-09-30 DIAGNOSIS — M79671 Pain in right foot: Secondary | ICD-10-CM

## 2016-09-30 DIAGNOSIS — M47816 Spondylosis without myelopathy or radiculopathy, lumbar region: Secondary | ICD-10-CM | POA: Insufficient documentation

## 2016-09-30 DIAGNOSIS — M5030 Other cervical disc degeneration, unspecified cervical region: Secondary | ICD-10-CM

## 2016-09-30 DIAGNOSIS — M1990 Unspecified osteoarthritis, unspecified site: Secondary | ICD-10-CM | POA: Insufficient documentation

## 2016-09-30 DIAGNOSIS — N3010 Interstitial cystitis (chronic) without hematuria: Secondary | ICD-10-CM

## 2016-09-30 DIAGNOSIS — M7062 Trochanteric bursitis, left hip: Secondary | ICD-10-CM

## 2016-09-30 DIAGNOSIS — Z79899 Other long term (current) drug therapy: Secondary | ICD-10-CM | POA: Insufficient documentation

## 2016-09-30 DIAGNOSIS — M47812 Spondylosis without myelopathy or radiculopathy, cervical region: Secondary | ICD-10-CM | POA: Insufficient documentation

## 2016-09-30 MED ORDER — LIDOCAINE HCL 1 % IJ SOLN
1.5000 mL | INTRAMUSCULAR | Status: AC | PRN
  Administered 2016-09-30: 1.5 mL

## 2016-09-30 MED ORDER — TRIAMCINOLONE ACETONIDE 40 MG/ML IJ SUSP
30.0000 mg | INTRAMUSCULAR | Status: AC | PRN
  Administered 2016-09-30: 30 mg via INTRA_ARTICULAR

## 2016-09-30 NOTE — Progress Notes (Signed)
Rheumatology Medication Review by a Pharmacist Does the patient feel that his/her medications are working for him/her?  No - reports bilateral hand pain  Has the patient been experiencing any side effects to the medications prescribed?  No Does the patient have any problems obtaining medications?  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  Patient is currently taking Arava 20 mg daily and reports adherence with the medication.  She had her standing labs on 09/04/16 which were normal.  Patient will be due for standing labs again in February 2018.  Patient denied any medication related questions or concerns at this time.     Mia [name], Pharm.D., BCPS Clinical Pharmacist Pager: [phone] Phone: [phone] 09/30/2016 11:31 AM

## 2016-09-30 NOTE — Patient Instructions (Signed)
Standing Labs We placed an order today for your standing lab work.    Please come back and get your standing labs in February 2018 and every 3 months.    We have open lab Monday through Friday from 8:30-11:30 AM and 1-4 PM at the office of Dr. Tresa Moore, PA.   The office is located at [address], [place], [place], [place] [address] No appointment is necessary.   Labs are drawn by [facility].  You may receive a bill from [facility] for your lab work.

## 2016-09-30 NOTE — Progress Notes (Signed)
Office Visit Note  Patient: Mia [name]             Date of Birth: 1951-05-09           MRN: [id number]             PCP: [name], MD Referring: Hali Marry, * Visit Date: 09/30/2016 Occupation: _0 @    Subjective:  Pain of the Right Hand; Pain of the Left Hand; Pain of the Right Foot; Pain of the Left Foot; Leg Pain (when standing); and Fibromyalgia (pain)   History of Present Illness: Mia [name] is a 65 y.o. female with history of seronegative rheumatoid arthritis and fibromyalgia syndrome. She states her bilateral hands have been hurting and wakes her up at night. His been getting worse over the past month. She also complains of bilateral lower extremity pain she states she has difficulty standing it feels better when she walks. These symptoms have been going on for last 2-3 months. She has history of bilateral trochanteric bursitis which is flaring as well. Her fibromyalgia has flared with the pain on scale of 0-10 about 7-8 and fatigue on 0-10 about 7-8.  Activities of Daily Living:  Patient reports morning stiffness for 15 minutes.   Patient Reports nocturnal pain.  Difficulty dressing/grooming: Reports Difficulty climbing stairs: Reports Difficulty getting out of chair: Reports Difficulty using hands for taps, buttons, cutlery, and/or writing: Denies   Review of Systems  Constitutional: Positive for fatigue and weakness. Negative for night sweats, weight gain and weight loss.  HENT: Positive for mouth dryness. Negative for mouth sores, trouble swallowing, trouble swallowing and nose dryness.   Eyes: Positive for dryness. Negative for pain, redness and visual disturbance.  Respiratory: Negative for cough, shortness of breath and difficulty breathing.   Cardiovascular: Negative for chest pain, palpitations, hypertension, irregular heartbeat and swelling in legs/feet.  Gastrointestinal: Negative for blood in stool, constipation and diarrhea.    Endocrine: Negative for increased urination.  Genitourinary: Negative for vaginal dryness.  Musculoskeletal: Positive for arthralgias, joint pain, joint swelling, myalgias, morning stiffness and myalgias. Negative for muscle weakness and muscle tenderness.  Skin: Positive for color change. Negative for rash, hair loss, skin tightness, ulcers and sensitivity to sunlight.  Allergic/Immunologic: Negative for susceptible to infections.  Neurological: Negative for dizziness, memory loss and night sweats.  Hematological: Negative for swollen glands.  Psychiatric/Behavioral: Positive for sleep disturbance. Negative for depressed mood. The patient is not nervous/anxious.     PMFS History:  Patient Active Problem List   Diagnosis Date Noted  . Inflammatory arthritis 09/30/2016  . High risk medication use 09/30/2016  . History of Clostridium difficile colitis 09/30/2016  . 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 Pseudogout 09/30/2016  . DDD cervical spine status post fusion 09/30/2016  . DDD thoracic spine 09/30/2016  . DDD lumbar spine 09/30/2016  . H/O total knee replacement, right 09/30/2016  . Age-related osteoporosis without current pathological fracture 09/30/2016  . Tremor, essential 05/14/2016  . Right foot pain 04/14/2016  . B12 deficiency 09/10/2015  . Syncope 09/10/2015  . Cervical facet joint syndrome 09/10/2015  . Chronic fatigue 09/10/2015  . Aortic atherosclerosis ([place]) 04/01/2015  . 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 Chronic Dyspnea   . DOE (dyspnea on exertion)   . Benign head tremor 06/11/2014  . Low back pain 06/11/2014  . IFG (impaired fasting glucose) 03/09/2014  . Osteopenia 03/09/2014  . IC (interstitial cystitis) 03/09/2014  . Hemorrhoid 03/09/2014  . Retinal  wrinkling, right eye 03/09/2014  . Fibromyalgia 03/09/2014  . GERD (gastroesophageal reflux disease) 03/09/2014  . History of arthroplasty of right knee 01/31/2014   . Memory difficulty 12/14/2013  . Hemorrhoids, external, thrombosed 06/22/2011  . Hypertension 03/11/2011  . SVT (supraventricular tachycardia) ([place])   . Raynaud phenomenon   . EDEMA 08/25/2010  . Nonspecific (abnormal) findings on radiological and other examination of body structure 08/25/2010  . COMPUTERIZED TOMOGRAPHY, CHEST, ABNORMAL 08/25/2010  . OSA (obstructive sleep apnea) 04/24/2009  . 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 Allergic rhinitis 06/11/2008  . Dyspnea 06/11/2008  . 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 Chronic diastolic CHF (congestive heart failure) ([place]) 06/08/2008  . INTERSTITIAL CYSTITIS 06/08/2008    Past Medical History:  Diagnosis Date  . Anal fissure   . Anemia   . Arthritis   . Blood transfusion   . C. difficile colitis   . Chest pain    a. 01/2013 MV: EF 59%, no ischemia.  . Chronic Dyspnea    a. 01/2013 Echo: EF 60-65%, Gr 1 DD, PASP 74mHg.  . Diabetes mellitus   . Fibromyalgia   . Gall stones   . GERD (gastroesophageal reflux disease)    gastritis  . Hemorrhoids   . Hypertension   . Interstitial cystitis   . Memory difficulty 12/14/2013  . Neuromuscular disorder ([place]    sclerosis  . Osteoporosis   . Peptic ulcer   . PONV (postoperative nausea and vomiting)   . Pseudogout   . Raynaud phenomenon   . Rectal bleeding   . Sleep apnea    a. on cpap.  .Marland KitchenSVT (supraventricular tachycardia) ([place]   . Syncope 09/10/2015  . Thyroid disease    hypothyroidism  . Tremor, essential 05/14/2016    Family History  Problem Relation Age of Onset  . Heart attack Mother   . Stroke Mother   . Diabetes Mother   . Heart disease Mother   . Colon polyps Mother   . Breast cancer Maternal Grandmother   . Wilson's disease Maternal Grandmother   . Pancreatic cancer Maternal Grandfather   . Heart disease Father   . Heart attack Father   . Motor neuron disease Sister   . Stroke Sister   . Lupus Sister   . Dementia Sister   .  Arthritis/Rheumatoid Sister   . Asthma Sister   . Colon polyps Sister   . Irritable bowel syndrome Sister    Past Surgical History:  Procedure Laterality Date  . APPENDECTOMY    . BLADDER SURGERY     x2  . BLADDER SURGERY    . BREAST BIOPSY    . CARDIAC CATHETERIZATION    . CATARACT EXTRACTION Bilateral   . CHOLECYSTECTOMY    . DILATION AND CURETTAGE OF UTERUS    . ENTEROCELE REPAIR     x2  . EYE SURGERY     retina  . hysterectomy - unknown type    . KNEE SURGERY     x6  . NECK SURGERY     fusion  . OOPHORECTOMY    . QUADRICEPS REPAIR Right   . RECTOCELE REPAIR     x2  . SHOULDER ARTHROSCOPY WITH SUBACROMIAL DECOMPRESSION, ROTATOR CUFF REPAIR AND BICEP TENDON REPAIR  10/06/2012   Procedure: SHOULDER ARTHROSCOPY WITH SUBACROMIAL DECOMPRESSION, ROTATOR CUFF REPAIR AND BICEP TENDON REPAIR;  Surgeon: JNita Sells MD;  Location: [facility]  Service: Orthopedics;  Laterality: Right;  Arthroscopic  Repair  of  Subscapularis,  Open Biceps Tenodesis  . SHOULDER SURGERY     bilateral- bones spur  . TONSILLECTOMY    . TOTAL KNEE ARTHROPLASTY     Social History   Social History Narrative   Patient drinks about 3-4 cups of caffeine daily.   Patient is right handed.     Objective: Vital Signs: BP 128/80   Pulse 88   Resp 16   Ht _0  (1.676 m)   Wt 180 lb (81.6 kg)   BMI 29.05 kg/m    Physical Exam  Constitutional: She is oriented to person, place, and time. She appears well-developed and well-nourished.  HENT:  Head: Normocephalic and atraumatic.  Eyes: Conjunctivae and EOM are normal.  Neck: Normal range of motion.  Cardiovascular: Normal rate, regular rhythm, normal heart sounds and intact distal pulses.   Pulmonary/Chest: Effort normal and breath sounds normal.  Abdominal: Soft. Bowel sounds are normal.  Lymphadenopathy:    She has no cervical adenopathy.  Neurological: She is alert and oriented to person, place, and time.  Skin: Skin  is warm and dry. Capillary refill takes less than 2 seconds.  Psychiatric: She has a normal mood and affect. Her behavior is normal.  Nursing note and vitals reviewed.    Musculoskeletal Exam: She has limited range of motion of her C-spine, thoracic and lumbar spine. Shoulder joints elbow joints wrist joints MCPs PIPs with good range of motion with no synovitis. Hip joints were good range of motion with no discomfort. Her right knee joint which is replaced has some warmth and swelling. Ankle joints are good range of motion MTPs with good range of motion with no synovitis. Fibromyalgia tender points were 11 out of 18 positive. She had tenderness on palpation over bilateral trochanteric bursa.  CDAI Exam: CDAI Homunculus Exam:   Tenderness:  RLE: tibiofemoral  Swelling:  RLE: tibiofemoral  Joint Counts:  CDAI Tender Joint count: 1 CDAI Swollen Joint count: 1  Global Assessments:  Patient Global Assessment: 7 Provider Global Assessment: 4  CDAI Calculated Score: 13    Investigation: No additional findings. Orders Only on 09/04/2016  Component Date Value Ref Range Status  . Sodium 09/05/2016 137  135 - 146 mmol/L Final  . Potassium 09/05/2016 4.1  3.5 - 5.3 mmol/L Final  . Chloride 09/05/2016 100  98 - 110 mmol/L Final  . CO2 09/05/2016 27  20 - 31 mmol/L Final  . Glucose, Bld 09/05/2016 115* 65 - 99 mg/dL Final  . BUN 09/05/2016 14  7 - 25 mg/dL Final  . Creat 09/05/2016 0.74  0.50 - 0.99 mg/dL Final   Comment:   For patients > or = 65 years of age: The upper reference limit for Creatinine is approximately 13% higher for people identified as African-American.     . Total Bilirubin 09/05/2016 0.2  0.2 - 1.2 mg/dL Final  . Alkaline Phosphatase 09/05/2016 85  33 - 130 U/L Final  . AST 09/05/2016 21  10 - 35 U/L Final  . ALT 09/05/2016 20  6 - 29 U/L Final  . Total Protein 09/05/2016 6.8  6.1 - 8.1 g/dL Final  . Albumin 09/05/2016 4.1  3.6 - 5.1 g/dL Final  . Calcium  09/05/2016 10.1  8.6 - 10.4 mg/dL Final  . GFR, Est African American 09/05/2016 >89  >=60 mL/min Final  . GFR, Est Non African American 09/05/2016 85  >=60 mL/min Final  . WBC 09/05/2016 7.4  3.8 - 10.8 K/uL Final  . RBC 09/05/2016 4.21  3.80 - 5.10 MIL/uL Final  . Hemoglobin 09/05/2016 12.8  11.7 - 15.5 g/dL Final  . HCT 09/05/2016 39.8  35.0 - 45.0 % Final  . MCV 09/05/2016 94.5  80.0 - 100.0 fL Final  . MCH 09/05/2016 30.4  27.0 - 33.0 pg Final  . MCHC 09/05/2016 32.2  32.0 - 36.0 g/dL Final  . RDW 09/05/2016 14.1  11.0 - 15.0 % Final  . Platelets 09/05/2016 398  140 - 400 K/uL Final  . MPV 09/05/2016 10.3  7.5 - 12.5 fL Final  . Neutro Abs 09/05/2016 4144  1,500 - 7,800 cells/uL Final  . Lymphs Abs 09/05/2016 2220  850 - 3,900 cells/uL Final  . Monocytes Absolute 09/05/2016 740  200 - 950 cells/uL Final  . Eosinophils Absolute 09/05/2016 222  15 - 500 cells/uL Final  . Basophils Absolute 09/05/2016 74  0 - 200 cells/uL Final  . Neutrophils Relative % 09/05/2016 56  % Final  . Lymphocytes Relative 09/05/2016 30  % Final  . Monocytes Relative 09/05/2016 10  % Final  . Eosinophils Relative 09/05/2016 3  % Final  . Basophils Relative 09/05/2016 1  % Final  . Smear Review 09/05/2016 Criteria for review not met   Final  Office Visit on 08/28/2016  Component Date Value Ref Range Status  . Cholesterol 08/29/2016 133  125 - 200 mg/dL Final  . Triglycerides 08/29/2016 110  <150 mg/dL Final  . HDL 08/29/2016 41* >=46 mg/dL Final  . Total CHOL/HDL Ratio 08/29/2016 3.2  <=5.0 Ratio Final  . VLDL 08/29/2016 22  <30 mg/dL Final  . LDL Cholesterol 08/29/2016 70  <130 mg/dL Final   Comment:   Total Cholesterol/HDL Ratio:CHD Risk                        Coronary Heart Disease Risk Table                                        Men       Women          1/2 Average Risk              3.4        3.3              Average Risk              5.0        4.4           2X Average Risk              9.6         7.1           3X Average Risk             23.4       11.0 Use the calculated Patient Ratio above and the CHD Risk table  to determine the patient's CHD Risk.   . Sodium 08/29/2016 137  135 - 146 mmol/L Final  . Potassium 08/29/2016 3.9  3.5 - 5.3 mmol/L Final  . Chloride 08/29/2016 98  98 - 110 mmol/L Final  . CO2 08/29/2016 27  20 - 31 mmol/L Final  . Glucose, Bld 08/29/2016 100* 65 - 99 mg/dL Final  . BUN 08/29/2016 9  7 - 25 mg/dL Final  . Creat 08/29/2016 0.80  0.50 - 0.99  mg/dL Final   Comment:   For patients > or = 65 years of age: The upper reference limit for Creatinine is approximately 13% higher for people identified as African-American.     . Total Bilirubin 08/29/2016 0.3  0.2 - 1.2 mg/dL Final  . Alkaline Phosphatase 08/29/2016 84  33 - 130 U/L Final  . AST 08/29/2016 23  10 - 35 U/L Final  . ALT 08/29/2016 21  6 - 29 U/L Final  . Total Protein 08/29/2016 7.0  6.1 - 8.1 g/dL Final  . Albumin 08/29/2016 4.2  3.6 - 5.1 g/dL Final  . Calcium 08/29/2016 9.7  8.6 - 10.4 mg/dL Final  . TSH 08/28/2016 0.75  mIU/L Final   Comment:   Reference Range   > or = 20 Years  0.40-4.50   Pregnancy Range First trimester  0.26-2.66 Second trimester 0.55-2.73 Third trimester  0.43-2.91       Imaging: No results found.  Speciality Comments: No specialty comments available.    Procedures:  Large Joint Inj Date/Time: 09/30/2016 12:10 PM Performed by: Bo Merino Authorized by: Bo Merino   Consent Given by:  Patient Site marked: the procedure site was marked   Timeout: prior to procedure the correct patient, procedure, and site was verified   Indications:  Pain Location:  Hip Site:  R greater trochanter Prep: patient was prepped and draped in usual sterile fashion   Needle Size:  27 G Needle Length:  1.5 inches Approach:  Lateral Ultrasound Guidance: No   Fluoroscopic Guidance: No   Arthrogram: No   Medications:  1.5 mL lidocaine 1 %; 30 mg  triamcinolone acetonide 40 MG/ML Aspiration Attempted: No   Aspirate amount (mL):  0 Patient tolerance:  Patient tolerated the procedure well with no immediate complications Large Joint Inj Date/Time: 09/30/2016 12:11 PM Performed by: Bo Merino Authorized by: Bo Merino   Consent Given by:  Patient Site marked: the procedure site was marked   Timeout: prior to procedure the correct patient, procedure, and site was verified   Indications:  Pain Location:  Hip Site:  L greater trochanter Prep: patient was prepped and draped in usual sterile fashion   Needle Size:  27 G Needle Length:  1.5 inches Approach:  Lateral Ultrasound Guidance: No   Fluoroscopic Guidance: No   Arthrogram: No   Medications:  1.5 mL lidocaine 1 %; 30 mg triamcinolone acetonide 40 MG/ML Aspiration Attempted: No   Aspirate amount (mL):  0 Patient tolerance:  Patient tolerated the procedure well with no immediate complications   Allergies: Myrbetriq  [mirabegron]; Percocet [oxycodone-acetaminophen]; Celebrex [celecoxib]; Codeine; Darvocet [propoxyphene n-acetaminophen]; Erythromycin; Hydrocodone; Lyrica [pregabalin]; Macrodantin [nitrofurantoin]; Toradol [ketorolac tromethamine]; Tramadol; and Verapamil   Assessment / Plan:     Visit Diagnoses: Seronegative rheumatoid arthritis - With Raynauds. She had no synovitis on examination despite having multiple arthralgias.  High risk medication use - Arava 20 mg by mouth daily -her labs have been stable. We will check her labs again in February and then every 3 months to monitor for drug toxicity. Plan: CBC with Differential/Platelet, COMPLETE METABOLIC PANEL WITH GFR  Fibromyalgia: She continues to have generalized pain and discomfort and myalgias.  Trochanteric bursitis of both hips -she complains of severe bilateral trochanteric bursitis some. Different treatment options and their side effects were discussed. And the trochanteric bursa were injected as  described above. Plan: Large Joint Injection/Arthrocentesis, Large Joint Injection/Arthrocentesis  Pseudogout: She has no flare.  Chronic fatigue: It is due to chronic insomnia  and fibromyalgia.  DDD cervical spine status post fusion: She has limited range of motion  DDD thoracic spine  DDD lumbar spine  H/O total knee replacement, right: She continues to have some discomfort and swelling.  Right foot pain: Proper fitting shoes were discussed.  She has multiple medical problems some of which are described below:  History of Clostridium difficile colitis  Age-related osteoporosis without current pathological fracture  Primary lateral sclerosis  SVT (supraventricular tachycardia) ([facility])  IC (interstitial cystitis)    Orders: Orders Placed This Encounter  Procedures  . Large Joint Injection/Arthrocentesis  . Large Joint Injection/Arthrocentesis  . CBC with Differential/Platelet  . COMPLETE METABOLIC PANEL WITH GFR   No orders of the defined types were placed in this encounter.   Face-to-face time spent with patient was 35 minutes. 50% of time was spent in counseling and coordination of care.  Follow-Up Instructions: Return in about 5 months (around 02/28/2017) for Rheumatoid arthritis.   Bo Merino, MD

## 2016-09-30 NOTE — Telephone Encounter (Signed)
Vera from [facility] left a msg on the refill vm requesting a refill on protonix for the patient. I called [facility] and made them aware that an rx was sent to [facility] for this on 09/28/16 so I feel that the patients preferred pharmacy per insurance has changed, also the patient was just seen and [facility] is no longer listed as a pharmacy for her. I spoke with patient and she stated that she is now using [facility] and she has already been receiving her medications from them.

## 2016-10-02 ENCOUNTER — Ambulatory Visit (INDEPENDENT_AMBULATORY_CARE_PROVIDER_SITE_OTHER): Payer: Medicare Other | Admitting: Family Medicine

## 2016-10-02 VITALS — BP 137/87 | HR 87

## 2016-10-02 DIAGNOSIS — E538 Deficiency of other specified B group vitamins: Secondary | ICD-10-CM

## 2016-10-02 MED ORDER — CYANOCOBALAMIN 1000 MCG/ML IJ SOLN
1000.0000 ug | Freq: Once | INTRAMUSCULAR | Status: AC
  Administered 2016-10-02: 1000 ug via INTRAMUSCULAR

## 2016-10-02 NOTE — Progress Notes (Signed)
Pt came into clinic today for monthly B12 injections. Pt reports no complications regarding injections. Pt tolerated injection in RUOQ well, no immediate complications. Pt advised to schedule her next appointment in 4 weeks, verbalized understanding. No further questions.

## 2016-10-02 NOTE — Progress Notes (Signed)
Agree with below. \Catherine Metheney, MD  

## 2016-10-23 ENCOUNTER — Telehealth: Payer: Self-pay | Admitting: Family Medicine

## 2016-10-23 MED ORDER — PREDNISONE 50 MG PO TABS: ORAL_TABLET | ORAL | 0 refills | Status: DC

## 2016-10-23 NOTE — Telephone Encounter (Signed)
Pt left VM on triage line today stating she thinks she is having an "arthritis or fibromyalgia flare." Reports she is having trouble moving her neck and walking. Request Rx for Prednisone.   Called Pt, no answer. Left VM advising Pt to go to [facility] for evaluation since she is having trouble walking. Callback information for any questions. Will route to a Provider in office today for review.

## 2016-10-23 NOTE — Telephone Encounter (Signed)
5 day burst rx'ed

## 2016-10-23 NOTE — Telephone Encounter (Signed)
Pt advised.

## 2016-10-27 ENCOUNTER — Ambulatory Visit: Payer: Medicare Other | Admitting: Family Medicine

## 2016-10-27 ENCOUNTER — Telehealth: Payer: Self-pay | Admitting: *Deleted

## 2016-10-27 ENCOUNTER — Emergency Department (INDEPENDENT_AMBULATORY_CARE_PROVIDER_SITE_OTHER): Payer: Medicare Other

## 2016-10-27 ENCOUNTER — Emergency Department (INDEPENDENT_AMBULATORY_CARE_PROVIDER_SITE_OTHER)
Admission: EM | Admit: 2016-10-27 | Discharge: 2016-10-27 | Disposition: A | Discharge status: Home / Self Care | Payer: Medicare Other | Source: Home / Self Care | Attending: Family Medicine | Admitting: Family Medicine

## 2016-10-27 ENCOUNTER — Encounter: Payer: Self-pay | Admitting: *Deleted

## 2016-10-27 ENCOUNTER — Ambulatory Visit (HOSPITAL_BASED_OUTPATIENT_CLINIC_OR_DEPARTMENT_OTHER)
Admission: RE | Admit: 2016-10-27 | Discharge: 2016-10-27 | Disposition: A | Discharge status: Home / Self Care | Payer: Medicare Other | Source: Ambulatory Visit | Attending: Family Medicine | Admitting: Family Medicine

## 2016-10-27 DIAGNOSIS — S86892A Other injury of other muscle(s) and tendon(s) at lower leg level, left leg, initial encounter: Secondary | ICD-10-CM

## 2016-10-27 DIAGNOSIS — M79662 Pain in left lower leg: Secondary | ICD-10-CM | POA: Diagnosis not present

## 2016-10-27 DIAGNOSIS — M79605 Pain in left leg: Secondary | ICD-10-CM | POA: Diagnosis not present

## 2016-10-27 MED ORDER — MELOXICAM 15 MG PO TABS: 15.0000 mg | ORAL_TABLET | Freq: Every morning | ORAL | 0 refills | Status: DC

## 2016-10-27 NOTE — ED Triage Notes (Signed)
Pt c/o lower LT leg pain x 3 1/2 wks. Pain is only when walking. She completed prednisone today. She has applied ice/heat and Voltaren gel, and taken aleve without relief.

## 2016-10-27 NOTE — ED Provider Notes (Signed)
[name]    CSN: [id number] Arrival date & time: 10/27/16  1114     History   Chief Complaint Chief Complaint  Patient presents with  . Leg Pain    HPI Mia [name] is a 66 y.o. female.   Patient complains of onset of pain in her left posterior calf about 4 weeks ago without preceding injury.  Last week she took a 5 day course of prednisone with improvement of her left leg pain, but now has pain in her left pretibial area.    Leg Pain  Location:  Leg Time since incident:  4 weeks Injury: no   Leg location:  L leg Pain details:    Quality:  Aching   Radiates to:  Does not radiate   Severity:  Moderate   Onset quality:  Gradual   Duration:  4 weeks   Timing:  Constant   Progression:  Partially resolved Chronicity:  New Relieved by:  Nothing Worsened by:  Activity and bearing weight Ineffective treatments:  NSAIDs Associated symptoms: swelling   Associated symptoms: no back pain, no fatigue, no fever, no muscle weakness, no numbness, no stiffness and no tingling     Past Medical History:  Diagnosis Date  . Anal fissure   . Anemia   . Arthritis   . Blood transfusion   . C. difficile colitis   . Chest pain    a. 01/2013 MV: EF 59%, no ischemia.  . Chronic Dyspnea    a. 01/2013 Echo: EF 60-65%, Gr 1 DD, PASP 57mmHg.  . Diabetes mellitus   . Fibromyalgia   . Gall stones   . GERD (gastroesophageal reflux disease)    gastritis  . Hemorrhoids   . Hypertension   . Interstitial cystitis   . Memory difficulty 12/14/2013  . Neuromuscular disorder ([place])    sclerosis  . Osteoporosis   . Peptic ulcer   . PONV (postoperative nausea and vomiting)   . Pseudogout   . Raynaud phenomenon   . Rectal bleeding   . Sleep apnea    a. on cpap.  Marland Kitchen SVT (supraventricular tachycardia) ([place])   . Syncope 09/10/2015  . Thyroid disease    hypothyroidism  . Tremor, essential 05/14/2016    Patient Active Problem List   Diagnosis Date Noted  . Left leg pain  11/05/2016  . Inflammatory arthritis 09/30/2016  . High risk medication use 09/30/2016  . History of Clostridium difficile colitis 09/30/2016  . 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 Pseudogout 09/30/2016  . DDD cervical spine status post fusion 09/30/2016  . DDD thoracic spine 09/30/2016  . DDD lumbar spine 09/30/2016  . H/O total knee replacement, right 09/30/2016  . Age-related osteoporosis without current pathological fracture 09/30/2016  . Tremor, essential 05/14/2016  . Right foot pain 04/14/2016  . B12 deficiency 09/10/2015  . Syncope 09/10/2015  . Cervical facet joint syndrome 09/10/2015  . Chronic fatigue 09/10/2015  . Aortic atherosclerosis ([place]) 04/01/2015  . 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 Chronic Dyspnea   . DOE (dyspnea on exertion)   . Benign head tremor 06/11/2014  . Low back pain 06/11/2014  . IFG (impaired fasting glucose) 03/09/2014  . Osteopenia 03/09/2014  . IC (interstitial cystitis) 03/09/2014  . Hemorrhoid 03/09/2014  . Retinal wrinkling, right eye 03/09/2014  . Fibromyalgia 03/09/2014  . GERD (gastroesophageal reflux disease) 03/09/2014  . History of arthroplasty of right knee 01/31/2014  . Memory difficulty 12/14/2013  . Hemorrhoids, external, thrombosed  06/22/2011  . Hypertension 03/11/2011  . SVT (supraventricular tachycardia) ([place])   . Raynaud phenomenon   . EDEMA 08/25/2010  . Nonspecific (abnormal) findings on radiological and other examination of body structure 08/25/2010  . COMPUTERIZED TOMOGRAPHY, CHEST, ABNORMAL 08/25/2010  . OSA (obstructive sleep apnea) 04/24/2009  . 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 Allergic rhinitis 06/11/2008  . Dyspnea 06/11/2008  . 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 Chronic diastolic CHF (congestive heart failure) ([place]) 06/08/2008  . INTERSTITIAL CYSTITIS 06/08/2008    Past Surgical History:  Procedure Laterality Date  . APPENDECTOMY    .  BLADDER SURGERY     x2  . BLADDER SURGERY    . BREAST BIOPSY    . CARDIAC CATHETERIZATION    . CATARACT EXTRACTION Bilateral   . CHOLECYSTECTOMY    . DILATION AND CURETTAGE OF UTERUS    . ENTEROCELE REPAIR     x2  . EYE SURGERY     retina  . hysterectomy - unknown type    . KNEE SURGERY     x6  . NECK SURGERY     fusion  . OOPHORECTOMY    . QUADRICEPS REPAIR Right   . RECTOCELE REPAIR     x2  . SHOULDER ARTHROSCOPY WITH SUBACROMIAL DECOMPRESSION, ROTATOR CUFF REPAIR AND BICEP TENDON REPAIR  10/06/2012   Procedure: SHOULDER ARTHROSCOPY WITH SUBACROMIAL DECOMPRESSION, ROTATOR CUFF REPAIR AND BICEP TENDON REPAIR;  Surgeon: Nita Sells, MD;  Location: [place] [facility];  Service: Orthopedics;  Laterality: Right;  Arthroscopic  Repair  of  Subscapularis, Open Biceps Tenodesis  . SHOULDER SURGERY     bilateral- bones spur  . TONSILLECTOMY    . TOTAL KNEE ARTHROPLASTY      OB History    No data available       Home Medications    Prior to Admission medications   Medication Sig Start Date End Date Taking? Authorizing Provider  alendronate (FOSAMAX) 70 MG tablet Take 1 tablet by mouth  every 7 days with a full  glass of water on an empty  stomach 09/04/16   Hali Marry, MD  amitriptyline (ELAVIL) 25 MG tablet TAKE 1 TABLET BY MOUTH AT  BEDTIME 08/11/16   Hali Marry, MD  atorvastatin (LIPITOR) 40 MG tablet Take 1 tablet (40 mg total) by mouth daily. 08/28/16   Thayer Headings, MD  calcium carbonate (OS-CAL) 600 MG TABS Take 600 mg by mouth daily with breakfast.     Historical Provider, MD  cetirizine (ZYRTEC) 10 MG tablet Take 10 mg by mouth as needed for allergies.    Historical Provider, MD  Cyanocobalamin (VITAMIN B-12 IJ) Inject 1,000 mg as directed every 28 (twenty-eight) days.    Historical Provider, MD  donepezil (ARICEPT) 5 MG tablet Take 1 tablet by mouth at  bedtime 07/02/16   Kathrynn Ducking, MD  fluticasone [facility]) 50 MCG/ACT  nasal spray Place into both nostrils as needed for allergies or rhinitis.    Historical Provider, MD  furosemide (LASIX) 20 MG tablet Take 1 tablet (20 mg total) by mouth daily. 09/28/16   Thayer Headings, MD  gabapentin (NEURONTIN) 300 MG capsule TAKE 1 TO 2 CAPSULES BY MOUTH UP TO TWO TIMES DAILY AS NEEDED 09/09/16   Hali Marry, MD  glucose blood test strip Use to test blood sugar once daily. Dx:R73.01 01/06/16   Hali Marry, MD  ipratropium (ATROVENT) 0.03 % nasal spray Place 2 sprays into both nostrils every  12 (twelve) hours as needed for rhinitis.    Historical Provider, MD  leflunomide (ARAVA) 20 MG tablet TAKE 1 TABLET BY MOUTH  EVERY DAY 09/28/16   Naitik Panwala, PA-C  meloxicam (MOBIC) 15 MG tablet Take 1 tablet (15 mg total) by mouth every morning. With food. 10/27/16   Kandra Nicolas, MD  metFORMIN (GLUCOPHAGE) 500 MG tablet Take 1 tablet (500 mg total) by mouth 2 (two) times daily with a meal. Needs follow up with PCP 11/12/16   Hali Marry, MD  methocarbamol (ROBAXIN) 500 MG tablet Take 500 mg by mouth 3 (three) times daily as needed for muscle spasms.    Historical Provider, MD  metoprolol (LOPRESSOR) 50 MG tablet Take 1.5 tablets (75 mg total) by mouth 2 (two) times daily. 08/28/16 11/26/16  Thayer Headings, MD  NON FORMULARY Place into the nose at bedtime. cpap machine    Historical Provider, MD  ondansetron (ZOFRAN-ODT) 4 MG disintegrating tablet Take 4 mg by mouth every 6 (six) hours as needed for nausea or vomiting.    Historical Provider, MD  pantoprazole (PROTONIX) 40 MG tablet Take 1 tablet (40 mg total) by mouth 2 (two) times daily. 09/28/16   Thayer Headings, MD  primidone (MYSOLINE) 50 MG tablet Take 1 tablet (50 mg total) by mouth at bedtime. 09/04/16   Hali Marry, MD  propranolol (INDERAL) 10 MG tablet Take 1 tablet (10 mg total) by mouth 4 (four) times daily as needed. 08/28/16   Thayer Headings, MD  SUMAtriptan (IMITREX) 50 MG tablet Take 1  tablet (50 mg total) by mouth every 2 (two) hours as needed. 07/08/16   Hali Marry, MD  venlafaxine XR (EFFEXOR-XR) 75 MG 24 hr capsule Take 1 capsule by mouth  daily with breakfast 12/10/15   Kathrynn Ducking, MD  vitamin C (ASCORBIC ACID) 500 MG tablet Take 500 mg by mouth daily.    Historical Provider, MD  vitamin D, CHOLECALCIFEROL, 400 UNITS tablet Take 400 Units by mouth daily.     Historical Provider, MD  zinc gluconate 50 MG tablet Take 50 mg by mouth daily.    Historical Provider, MD    Family History Family History  Problem Relation Age of Onset  . Heart attack Mother   . Stroke Mother   . Diabetes Mother   . Heart disease Mother   . Colon polyps Mother   . Breast cancer Maternal Grandmother   . Wilson's disease Maternal Grandmother   . Pancreatic cancer Maternal Grandfather   . Heart disease Father   . Heart attack Father   . Motor neuron disease Sister   . Stroke Sister   . Lupus Sister   . Dementia Sister   . Arthritis/Rheumatoid Sister   . Asthma Sister   . Colon polyps Sister   . Irritable bowel syndrome Sister     Social History Social History  Substance Use Topics  . Smoking status: Never Smoker  . Smokeless tobacco: Never Used  . Alcohol use No     Allergies   Myrbetriq  [mirabegron]; Percocet [oxycodone-acetaminophen]; Celebrex [celecoxib]; Codeine; Darvocet [propoxyphene n-acetaminophen]; Erythromycin; Hydrocodone; Lyrica [pregabalin]; Macrodantin [nitrofurantoin]; Toradol [ketorolac tromethamine]; Tramadol; and Verapamil   Review of Systems Review of Systems  Constitutional: Negative for fatigue and fever.  Respiratory: Negative for cough, chest tightness, shortness of breath and stridor.   Cardiovascular: Negative for chest pain.  Musculoskeletal: Negative for back pain and stiffness.  All other systems reviewed and are negative.  Physical Exam Triage Vital Signs ED Triage Vitals  Enc Vitals Group     BP 10/27/16 1140 130/82      Pulse Rate 10/27/16 1140 72     Resp 10/27/16 1140 16     Temp 10/27/16 1140 98.7 F (37.1 C)     Temp Source 10/27/16 1140 Oral     SpO2 10/27/16 1140 96 %     Weight 10/27/16 1141 177 lb (80.3 kg)     Height 10/27/16 1141 5\' 6"  (1.676 m)     Head Circumference --      Peak Flow --      Pain Score 10/27/16 1150 9     Pain Loc --      Pain Edu? --      Excl. in [place]? --    No data found.   Updated Vital Signs BP 130/82 (BP Location: Left Arm)   Pulse 72   Temp 98.7 F (37.1 C) (Oral)   Resp 16   Ht 5\' 6"  (1.676 m)   Wt 177 lb (80.3 kg)   SpO2 96%   BMI 28.57 kg/m   Visual Acuity Right Eye Distance:   Left Eye Distance:   Bilateral Distance:    Right Eye Near:   Left Eye Near:    Bilateral Near:     Physical Exam  Constitutional: She appears well-developed and well-nourished. No distress.  HENT:  Head: Normocephalic.  Nose: Nose normal.  Mouth/Throat: Oropharynx is clear and moist.  Eyes: Conjunctivae are normal. Pupils are equal, round, and reactive to light.  Neck: Normal range of motion.  Cardiovascular: Normal heart sounds.   Pulmonary/Chest: Breath sounds normal.  Abdominal: There is no tenderness.  Musculoskeletal: She exhibits no edema.       Left lower leg: She exhibits tenderness.       Legs: Left lower leg has distinct tenderness to palpation over anterior tibia.  There is tenderness to palpation over anterior muscle compartments bilaterally.  There is mild tenderness inferior posterior calf. Patient has pain with resisted dorsiflexion of left ankle.  No edema.  No erythema or warmth.  Pedal pulses intact.  Neurological: She is alert.  Skin: Skin is warm and dry. No rash noted.  Nursing note and vitals reviewed.    UC Treatments / Results  Labs (all labs ordered are listed, but only abnormal results are displayed) Labs Reviewed - No data to display  EKG  EKG Interpretation None       Radiology Study Result   CLINICAL DATA:  Left calf  pain for 1 month.  EXAM: Left LOWER EXTREMITY VENOUS DOPPLER ULTRASOUND  TECHNIQUE: Gray-scale sonography with graded compression, as well as color Doppler and duplex ultrasound were performed to evaluate the lower extremity deep venous systems from the level of the common femoral vein and including the common femoral, femoral, profunda femoral, popliteal and calf veins including the posterior tibial, peroneal and gastrocnemius veins when visible. The superficial great saphenous vein was also interrogated. Spectral Doppler was utilized to evaluate flow at rest and with distal augmentation maneuvers in the common femoral, femoral and popliteal veins.  COMPARISON:  None.  FINDINGS: Contralateral Common Femoral Vein: Respiratory phasicity is normal and symmetric with the symptomatic side. No evidence of thrombus. Normal compressibility.  Common Femoral Vein: No evidence of thrombus. Normal compressibility, respiratory phasicity and response to augmentation.  Saphenofemoral Junction: No evidence of thrombus. Normal compressibility and flow on color Doppler imaging.  Profunda Femoral Vein: No evidence  of thrombus. Normal compressibility and flow on color Doppler imaging.  Femoral Vein: No evidence of thrombus. Normal compressibility, respiratory phasicity and response to augmentation.  Popliteal Vein: No evidence of thrombus. Normal compressibility, respiratory phasicity and response to augmentation.  Calf Veins: No evidence of thrombus. Normal compressibility and flow on color Doppler imaging.  Superficial Great Saphenous Vein: No evidence of thrombus. Normal compressibility and flow on color Doppler imaging.  Venous Reflux:  None.  Other Findings:  None.  IMPRESSION: No evidence of deep venous thrombosis seen in left lower extremity.   Electronically Signed   By: Marijo Conception, M.D.   On: 10/27/2016 15:03      Procedures Procedures  (including critical care time)  Medications Ordered in UC Medications - No data to display   Initial Impression / Assessment and Plan / UC Course  I have reviewed the triage vital signs and the nursing notes.  Pertinent labs & imaging results that were available during my care of the patient were reviewed by me and considered in my medical decision making (see chart for details).  No evidence DVT.  Begin stretching and range of motion exercises as per instruction sheet.  Followup with Dr. Aundria Mems if not improved two weeks.     Final Clinical Impressions(s) / UC Diagnoses   Final diagnoses:  Shin splints, left, initial encounter    New Prescriptions Discharge Medication List as of 10/27/2016  1:27 PM       Kandra Nicolas, MD 11/15/16 1015

## 2016-10-29 DIAGNOSIS — R350 Frequency of micturition: Secondary | ICD-10-CM | POA: Diagnosis not present

## 2016-10-29 DIAGNOSIS — N3941 Urge incontinence: Secondary | ICD-10-CM | POA: Diagnosis not present

## 2016-10-30 ENCOUNTER — Ambulatory Visit: Payer: Medicare Other

## 2016-11-02 ENCOUNTER — Ambulatory Visit (INDEPENDENT_AMBULATORY_CARE_PROVIDER_SITE_OTHER): Payer: Medicare Other | Admitting: Family Medicine

## 2016-11-02 VITALS — BP 122/72 | HR 68

## 2016-11-02 DIAGNOSIS — E538 Deficiency of other specified B group vitamins: Secondary | ICD-10-CM | POA: Diagnosis not present

## 2016-11-02 MED ORDER — VITAMIN D2 1,250 MCG (50,000 UNIT) CAPSULE
1250 mcg (50,000 unit) | ORAL_CAPSULE | ORAL | 3 refills | Status: DC
Start: 2016-11-02 — End: 2017-05-11

## 2016-11-02 MED ORDER — CYANOCOBALAMIN 1000 MCG/ML IJ SOLN
1000.0000 ug | Freq: Once | INTRAMUSCULAR | Status: AC
  Administered 2016-11-02: 1000 ug via INTRAMUSCULAR

## 2016-11-02 NOTE — Progress Notes (Signed)
Pt came into clinic today for monthly B12 injections. Pt reports no complications regarding injections. Pt tolerated injection in [place] well, no immediate complications. Pt advised to schedule her next appointment in 4 weeks, verbalized understanding. No further questions.  

## 2016-11-02 NOTE — Progress Notes (Signed)
Agree with below. \Mia Metheney, MD  

## 2016-11-03 ENCOUNTER — Inpatient Hospital Stay: Admit: 2016-11-03 | Payer: MEDICARE | Primary: Internal Medicine

## 2016-11-03 ENCOUNTER — Other Ambulatory Visit: Admit: 2016-11-03 | Discharge: 2016-11-03 | Payer: PRIVATE HEALTH INSURANCE | Primary: Internal Medicine

## 2016-11-03 DIAGNOSIS — R7301 Impaired fasting glucose: Secondary | ICD-10-CM

## 2016-11-04 LAB — LIPID PANEL
CHOL/HDL Ratio: 3.8 (ref 0–5.0)
Cholesterol, total: 154 MG/DL (ref <200)
HDL Cholesterol: 41 MG/DL (ref 40–60)
LDL, calculated: 76.8 MG/DL (ref 0–100)
Triglyceride: 181 MG/DL — ABNORMAL HIGH (ref <150)
VLDL, calculated: 36.2 MG/DL

## 2016-11-04 LAB — HEMOGLOBIN A1C W/O EAG: Hemoglobin A1c: 6.2 % — ABNORMAL HIGH (ref 4.2–5.6)

## 2016-11-05 ENCOUNTER — Ambulatory Visit (INDEPENDENT_AMBULATORY_CARE_PROVIDER_SITE_OTHER): Payer: Medicare Other | Admitting: Sports Medicine

## 2016-11-05 ENCOUNTER — Encounter: Payer: Self-pay | Admitting: Sports Medicine

## 2016-11-05 DIAGNOSIS — M1288 Other specific arthropathies, not elsewhere classified, other specified site: Secondary | ICD-10-CM | POA: Diagnosis not present

## 2016-11-05 DIAGNOSIS — R350 Frequency of micturition: Secondary | ICD-10-CM | POA: Diagnosis not present

## 2016-11-05 DIAGNOSIS — M84362A Stress fracture, left tibia, initial encounter for fracture: Secondary | ICD-10-CM

## 2016-11-05 DIAGNOSIS — M79605 Pain in left leg: Secondary | ICD-10-CM

## 2016-11-05 DIAGNOSIS — M47812 Spondylosis without myelopathy or radiculopathy, cervical region: Secondary | ICD-10-CM

## 2016-11-05 DIAGNOSIS — N3941 Urge incontinence: Secondary | ICD-10-CM | POA: Diagnosis not present

## 2016-11-05 HISTORY — DX: Stress fracture, left tibia, initial encounter for fracture: M84.362A

## 2016-11-05 NOTE — Assessment & Plan Note (Signed)
Did well with the C4-C5 facet injection and a C6-C7 epidural. With the most recent recurrence of pain a prednisone taper provided fantastic relief, if this occurs again we can simply start with prednisone tapers.

## 2016-11-05 NOTE — Progress Notes (Signed)
   Subjective:    I'm seeing this patient as a consultation for:  Dr. [name]  CC: Left leg swelling and pain  HPI: For the past several weeks this pleasant 66 year old female has noted an increasing amount of pain and swelling along the medial tibia on the left side, she was seen in urgent care where x-rays and venous Dopplers were negative. She was referred to me for further evaluation and definitive treatment. No increase in physical activity, pain is moderate, persistent. Localized without radiation.  Cervical spondylosis: Did extremely well with a cervical facet injection and epidural, she did have a recurrence which responded well to a burst of steroids. Overall doing extremely well.  Past medical history:  Negative.  See flowsheet/record as well for more information.  Surgical history: Negative.  See flowsheet/record as well for more information.  Family history: Negative.  See flowsheet/record as well for more information.  Social history: Negative.  See flowsheet/record as well for more information.  Allergies, and medications have been entered into the medical record, reviewed, and no changes needed.   Review of Systems: No headache, visual changes, nausea, vomiting, diarrhea, constipation, dizziness, abdominal pain, skin rash, fevers, chills, night sweats, weight loss, swollen lymph nodes, body aches, joint swelling, muscle aches, chest pain, shortness of breath, mood changes, visual or auditory hallucinations.   Objective:   General: Well Developed, well nourished, and in no acute distress.  Neuro/Psych: Alert and oriented x3, extra-ocular muscles intact, able to move all 4 extremities, sensation grossly intact. Skin: Warm and dry, no rashes noted.  Respiratory: Not using accessory muscles, speaking in full sentences, trachea midline.  Cardiovascular: Pulses palpable, no extremity edema. Abdomen: Does not appear distended. Left leg: Visibly swollen, palpable pitting  edema with significant tenderness at the medial tibial border.  Impression and Recommendations:   This case required medical decision making of moderate complexity.  Left leg pain Pain along the posterior medial tibial border with swelling consistent with stress fracture versus medial tibial stress syndrome. Venous Dopplers and x-rays were negative. Strap with compressive dressing, CAM boot, partial weightbearing with crutches and the right hand. Return to see me in one month, MRI if no better.  Cervical facet joint syndrome Did well with the C4-C5 facet injection and a C6-C7 epidural. With the most recent recurrence of pain a prednisone taper provided fantastic relief, if this occurs again we can simply start with prednisone tapers.

## 2016-11-05 NOTE — Assessment & Plan Note (Signed)
Pain along the posterior medial tibial border with swelling consistent with stress fracture versus medial tibial stress syndrome. Venous Dopplers and x-rays were negative. Strap with compressive dressing, CAM boot, partial weightbearing with crutches and the right hand. Return to see me in one month, MRI if no better.

## 2016-11-10 ENCOUNTER — Ambulatory Visit: Admit: 2016-11-10 | Discharge: 2016-11-10 | Payer: MEDICARE | Attending: Internal Medicine | Primary: Internal Medicine

## 2016-11-10 DIAGNOSIS — Z0000 Encounter for general adult medical examination without abnormal findings: Secondary | ICD-10-CM

## 2016-11-10 NOTE — ACP (Advance Care Planning) (Signed)
acp discussion done  She has amd at home and will bring in copy  Husband is poa

## 2016-11-10 NOTE — Progress Notes (Signed)
66 y.o. white female who presents for f/u    She's had a very busy several months.  Continues with the remodeling of the condo in [place] and also taking care of her elderly and mildly demented dad who was recently hospitalized.  Just not much time available    No gi complaints. Dr Ramirez's office called her to schedule the colo but again the scheduling issues as above    She did have that negative eval w Dr Jimmye Norman for the painless hematuria.  No recurrences since the summer    FBS <110, not checking pps.  Denies polyuria, polydipsia, nocturia, vision change.  Weight is about the same    Vitals 11/10/2016 08/25/2016 07/22/2016 07/13/2016 07/08/2016   Weight 168 lb 171 lb 9.6 oz 170 lb 170 lb 170 lb     No sx referable to the thyroid    Past Medical History:   Diagnosis Date   ??? Allergic rhinitis    ??? Anxiety state, unspecified    ??? Arthritis     Dr. Synetta Shadow, Dr. Marisa Hua   ??? Compression fx, thoracic spine [facility]) 2007    negative DEXA Dr. Marisa Hua   ??? Dyslipidemia    ??? Dyspepsia    ??? Frozen shoulder     right Dr. Wynonia Hazard MRI   ??? Left thyroid nodule 04/2016    1cm nodule; apparently noted on CT 2008 and unchanged   ??? Multiple lung nodules     no change 10/06, 03/07, 03/08   ??? Overweight (BMI 25.0-29.9)    ??? Painless hematuria 04/2016    Dr Jimmye Norman, neg eval   ??? Palpitations 2008    neg thallium 2008, nl holter 2008, echo nl lv/ef 65%/tr mr/dd/nl pasp   ??? Prediabetes    ??? Syncope     neurocardiogenic by tilt 1994   ??? Venous insufficiency    ??? Vitamin D deficiency      Past Surgical History:   Procedure Laterality Date   ??? ECHO 2D ADULT  12/04    normal with EF 70%   ??? HX COLONOSCOPY      Dr Rosendo Gros 2007 negative   ??? HX GYN      s/p BTL    ??? HX HEMORRHOIDECTOMY      Dr. Rosendo Gros 2007   ??? HX UROLOGICAL  06/2016    Dr Jimmye Norman; bladder bx showed benign lesions   ??? STRESS TEST THALLIUM STUDY      2005 neg; 1/17 negative ef 75%   ??? [place] ABD COMP  9/05    negative   ??? VAS CAROTID DUPLEX BILATERAL  2004    negative      Social History     Social History   ??? Marital status: MARRIED     Spouse name: N/A   ??? Number of children: 2   ??? 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 Smoking status: Never Smoker   ??? Smokeless tobacco: Never Used   ??? Alcohol use 2.4 oz/week     4 Glasses of wine per week   ??? Drug use: No   ??? Sexual activity: Not on file     Other Topics Concern   ??? Not on file     Social History Narrative     Current Outpatient Prescriptions   Medication Sig   ??? VITAMIN D2 50,000 unit capsule take 1  capsule by mouth every week   ??? lansoprazole (PREVACID) 30 mg capsule TAKE 1 CAPSULE BY MOUTH BEFORE BREAKFAST DAILY.   ??? atorvastatin (LIPITOR) 40 mg tablet take 1 tablet by mouth once daily   ??? citalopram (CELEXA) 10 mg tablet take 1 tablet by mouth once daily   ??? omega-3 fatty acids (FISH OIL) cap Take 2 Tabs by mouth daily.   ??? multivitamin (ONE A DAY) tablet Take 1 Tab by mouth daily.   ??? aspirin 81 mg chewable tablet Take 81 mg by mouth daily.     No current facility-administered medications for this visit.      Allergies   Allergen Reactions   ??? Fish Oil Nausea Only   ??? Relafen [Nabumetone] Nausea Only     REVIEW OF SYSTEMS: gyn 2012 Dr. Gunnar Bulla in [place], [place] 6/14, colo 2007 Dr Rosendo Gros, [place] 2007? Dr Margaretann Loveless ??? no vision change or eye pain  Oral ??? no mouth pain, tongue or tooth problems  Ears ??? no hearing loss, ear pain, fullness, no swallowing problems  Cardiac ??? no CP, PND, orthopnea, edema, palpitations or syncope  Chest ??? no breast masses  Resp ??? no wheezing, chronic coughing, dyspnea  Urinary ??? no dysuria, hematuria, flank pain, urgency, frequency  Ortho ??? no swelling, dec ROM, myalgias  Psych ??? denies any anxiety or depression symptoms, no hallucinations or violent ideation  Endo - no polyuria, polydipsia, nocturia, hot flashes    Visit Vitals   ??? BP 122/74 (BP 1 Location: Left arm, BP Patient Position: Sitting)   ??? Pulse 82    ??? Temp 98.2 ??F (36.8 ??C) (Oral)   ??? Resp 18   ??? Ht 5\' 4"  (1.626 m)   ??? Wt 168 lb (76.2 kg)   ??? SpO2 98%   ??? BMI 28.84 kg/m2   Affect is appropriate.  Mood stable  No apparent distress  HEENT --Anicteric sclerae.  No JVD, or bruits.  Thyroid fullness on left  Lungs --Clear to auscultation and percussion, normal percussion.  Heart --Regular rate and rhythm, no murmurs, rubs, gallops, or clicks.  Abdomen -- Soft and nontender, no hepatosplenomegaly or masses.  Extremities -- Without cyanosis, clubbing, edema. 2+ pulses equally and bilaterally.    LABS  From 5/10 showed   gluc 110, cr 0.70,               alt 23,                                    chol 154, tg 143, hdl 45, ldl-c 80,                                                            tsh 1.91  From 5/10 showed                      2 hr GTT 89  From 8/10 showed  vit d 23.0, ck 57, aldo 5.4  From 8/11 showed   gluc 108,                                     hba1c 6.3,                   chol 160, tg 172, hdl 39, ldl-c 87,  wbc 5.,7 hb 12.3, plt 235, ua neg,     tsh 2.28  From 5/12 showed   gluc 113, cr 0.71, gfr 94,  alt 16, hba1c 6.2, ldl-p 1989, chol 177, tg 161, hdl 43, ldl-c 102  From 11/12 showed                                                     hba1c 5.9, ldl-p 1218, chol 133, tg 116, hdl 45, ldl-c 65  From 5/13 showed   gluc 105, cr 0.55, gfr 102, alt 8,  hba1c 6.2,                   chol 148, tg 107, hdl 45, ldl-c 82,  wbc 5.0, hb 12.5, plt 172, vit d 40.3  From 11/13 showed        hba1c 6.2, ldl-p 1888, chol 176, tg 155, hdl 49, ldl-c 86,  wbc 6.2, hb 12.3, plt 182, vit d 34.4  From 5/14 showed        hba1c 6.2,     chol 152, tg 157, hdl 39, ldl-c 82  From 1/15 showed   gluc 113, cr 0.68, gfr>60, alt 11, hba1c 6.2,     chol 146, tg 130, hdl 43, ldl-c 77  From 7/15 showed        hba1c 6.3,     chol 133, tg 124, hdl 39, ldl-c 69, wbc 6.3, hb 12.2, plt 193   From 6/16 showed   gluc 106, cr 0.74, gfr>60, alt 26, hba1c 6.3,     chol 126, tg 125, hdl 46, ldl-c 55, wbc 5.7, hb 13.2, plt 203, vit d 7.2,   tsh 1.69  From 12/16 showed gluc 110, cr 0.68, gfr>60, alt 30, hba1c 6.1,     chol 135, tg 147, hdl 47, ldl-c 59, wbc 6.0, hb 12.7, plt 197  From 6/17 showed   gluc 122, cr 0.71, gfr>60, alt 33, hba1c 6.2,     chol 153, tg 140, hdl 46, ldl-c 79, wbc 6.3, hb 13.1, plt 190,        tsh 1.92, hep c neg    Results for orders placed or performed during the hospital encounter of 11/03/16   HEMOGLOBIN A1C W/O EAG   Result Value Ref Range    Hemoglobin A1c 6.2 (H) 4.2 - 5.6 %   LIPID PANEL   Result Value Ref Range    LIPID PROFILE          Cholesterol, total 154 <200 MG/DL    Triglyceride 181 (H) <150 MG/DL    HDL Cholesterol 41 40 - 60 MG/DL    LDL, calculated 76.8 0 - 100 MG/DL    VLDL, calculated 36.2 MG/DL    CHOL/HDL Ratio 3.8 0 - 5.0       Patient Active Problem List   Diagnosis Code   ???  Vitamin D deficiency E55.9   ??? Anxiety F41.9   ??? Dyslipidemia E78.5   ??? 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 Overweight (BMI 25.0-29.9) E66.3   ??? IFG (impaired fasting glucose) R73.01     Assessment and plan:  1. Prediabetes. Lifestyle and dietary modification reiterated. Weight loss advocated.   2. Hyperlipidemia. Continue current  3. Hypovitaminosis D. Continue supp  4. Anxiety.  Continue celexa  5. Overweight.  Dec app suppressants, med supervised wt loss, nutrition/dietician in the past. Portion control reiterated  6. Colo to be scheduled by her w Dr Rosendo Gros  7. Painless hematuria.  Per Dr Faythe Ghee  8. OSA eval per Dr Matilde Bash        RTC 7/18    Above conditions discussed at length and patient vocalized understanding.  All questions answered to patient satifaction

## 2016-11-10 NOTE — Progress Notes (Signed)
1. Have you been to the ER, urgent care clinic or hospitalized since your last visit? NO.     2. Have you seen or consulted any other health care providers outside of the [facility] since your last visit (Include any pap smears or colon screening)? YES GYN    Do you have an Advanced Directive? NO    Would you like information on Advanced Directives? YES

## 2016-11-10 NOTE — Patient Instructions (Addendum)
Advance Directives: Care Instructions  Your Care Instructions  An advance directive is a legal way to state your wishes at the end of your life. It tells your family and your doctor what to do if you can no longer say what you want.  There are two main types of advance directives. You can change them any time that your wishes change.  ?? A living will tells your family and your doctor your wishes about life support and other treatment.  ?? A durable power of attorney for health care lets you name a person to make treatment decisions for you when you can't speak for yourself. This person is called a health care agent.  If you do not have an advance directive, decisions about your medical care may be made by a doctor or a judge who doesn't know you.  It may help to think of an advance directive as a gift to the people who care for you. If you have one, they won't have to make tough decisions by themselves.  Follow-up care is a key part of your treatment and safety. Be sure to make and go to all appointments, and call your doctor if you are having problems. It's also a good idea to know your test results and keep a list of the medicines you take.  How can you care for yourself at home?  ?? Discuss your wishes with your loved ones and your doctor. This way, there are no surprises.  ?? Many states have a unique form. Or you might use a universal form that has been approved by many states. This kind of form can sometimes be completed and stored online. Your electronic copy will then be available wherever you have a connection to the Internet. In most cases, doctors will respect your wishes even if you have a form from a different state.  ?? You don't need a lawyer to do an advance directive. But you may want to get legal advice.  ?? Think about these questions when you prepare an advance directive:  ?? Who do you want to make decisions about your medical care if you are not  able to? Many people choose a family member or close friend.  ?? Do you know enough about life support methods that might be used? If not, talk to your doctor so you understand.  ?? What are you most afraid of that might happen? You might be afraid of having pain, losing your independence, or being kept alive by machines.  ?? Where would you prefer to die? Choices include your home, a hospital, or a nursing home.  ?? Would you like to have information about hospice care to support you and your family?  ?? Do you want to donate organs when you die?  ?? Do you want certain religious practices performed before you die? If so, put your wishes in the advance directive.  ?? Read your advance directive every year, and make changes as needed.  When should you call for help?  Be sure to contact your doctor if you have any questions.  Where can you learn more?  Go to http://www.healthwise.net/[employer]Help[employer]ctions.  Enter R264 in the search box to learn more about "Advance Directives: Care Instructions."  Current as of: July 20, 2015  Content Version: 11.4  ?? 2006-2017 Healthwise, Incorporated. Care instructions adapted under license by [employer] (which disclaims liability or warranty for this information). If you have questions about a medical condition or this instruction, always   ask your healthcare professional. [place] any warranty or liability for your use of this information.      Medicare Wellness Visit, Female    The best way to live healthy is to have a healthy lifestyle by eating a well-balanced diet, exercising regularly, limiting alcohol and stopping smoking.    Regular physical exams and screening tests are another way to keep healthy. Preventive exams provided by your health care provider can find health problems before they become diseases or illnesses. Preventive services including immunizations, screening tests, monitoring and exams  can help you take care of your own health.    All people over age 24 should have a pneumovax  and and a prevnar shot to prevent pneumonia. These are once in a lifetime unless you and your provider decide differently.    All people over 65 should have a yearly flu shot and a tetanus vaccine every 10 years.    A bone mass density to screen for osteoporosis or thinning of the bones should be done every 2 years after 65.    Screening for diabetes mellitus with a blood sugar test should be done every year.    Glaucoma is a disease of the eye due to increased ocular pressure that can lead to blindness and it should be done every year by an eye professional.    Cardiovascular screening tests that check for elevated lipids (fatty part of blood) which can lead to heart disease and strokes should be done every 5 years.    Colorectal screening that evaluates for blood or polyps in your colon should be done yearly as a stool test or every five years as a flexible sigmoidoscope or every 10 years as a colonoscopy up to age 56.    Breast cancer screening with a mammogram is recommended biennially  for women age 26-74.    Screening for cervical cancer with a pap smear and pelvic exam is recommended for women after age 61 years every 2 years up to age 70 or when the provider and patient decide to stop.If there is a history of cervical abnormalities or other increased risk for cancer then the test is recommended yearly.    Hepatitis C screening is also recommended for anyone born between [birth year] through 1965.    A shingles vaccine is also recommended once in a lifetime after age 4.    Your Medicare Wellness Exam is recommended annually.    Here is a list of your current Health Maintenance items with a due date:  Health Maintenance Due   Topic Date Due   ??? Colonoscopy  05/14/2016   ??? Glaucoma Screening   08/29/2016   ??? Bone Density Screening  08/29/2016   ??? Annual Well Visit  08/29/2016         Medicare Wellness Visit, Female     The best way to live healthy is to have a healthy lifestyle by eating a well-balanced diet, exercising regularly, limiting alcohol and stopping smoking.    Regular physical exams and screening tests are another way to keep healthy. Preventive exams provided by your health care provider can find health problems before they become diseases or illnesses. Preventive services including immunizations, screening tests, monitoring and exams can help you take care of your own health.    All people over age 45 should have a pneumovax  and and a prevnar shot to prevent pneumonia. These are once in a lifetime unless you and your provider decide differently.  All people over 65 should have a yearly flu shot and a tetanus vaccine every 10 years.    A bone mass density to screen for osteoporosis or thinning of the bones should be done every 2 years after 65.    Screening for diabetes mellitus with a blood sugar test should be done every year.    Glaucoma is a disease of the eye due to increased ocular pressure that can lead to blindness and it should be done every year by an eye professional.    Cardiovascular screening tests that check for elevated lipids (fatty part of blood) which can lead to heart disease and strokes should be done every 5 years.    Colorectal screening that evaluates for blood or polyps in your colon should be done yearly as a stool test or every five years as a flexible sigmoidoscope or every 10 years as a colonoscopy up to age 56.    Breast cancer screening with a mammogram is recommended biennially  for women age 48-74.    Screening for cervical cancer with a pap smear and pelvic exam is recommended for women after age 25 years every 2 years up to age 10 or when the provider and patient decide to stop.If there is a history of cervical abnormalities or other increased risk for cancer then the test is recommended yearly.    Hepatitis C screening is also recommended for anyone born between 65  through 1965.    A shingles vaccine is also recommended once in a lifetime after age 59.    Your Medicare Wellness Exam is recommended annually.    Here is a list of your current Health Maintenance items with a due date:  Health Maintenance Due   Topic Date Due   ??? Colonoscopy  05/14/2016   ??? Glaucoma Screening   08/29/2016   ??? Bone Density Screening  08/29/2016   ??? Annual Well Visit  08/29/2016

## 2016-11-10 NOTE — Progress Notes (Signed)
This is a welcome to medicare annual welness exam    I have reviewed the patient's medical history in detail and updated the computerized patient record.     History     Past Medical History:   Diagnosis Date   ??? Allergic rhinitis    ??? Anxiety state, unspecified    ??? Arthritis     Dr. Synetta Shadow, Dr. Marisa Hua   ??? Compression fx, thoracic spine [facility]) 2007    negative DEXA Dr. Marisa Hua   ??? Dyslipidemia    ??? Dyspepsia    ??? Frozen shoulder     right Dr. Wynonia Hazard MRI   ??? Left thyroid nodule 04/2016    1cm nodule; apparently noted on CT 2008 and unchanged   ??? Multiple lung nodules     no change 10/06, 03/07, 03/08   ??? Overweight (BMI 25.0-29.9)    ??? Painless hematuria 04/2016    Dr Jimmye Norman, neg eval   ??? Palpitations 2008    neg thallium 2008, nl holter 2008, echo nl lv/ef 65%/tr mr/dd/nl pasp   ??? Prediabetes    ??? Syncope     neurocardiogenic by tilt 1994   ??? Venous insufficiency    ??? Vitamin D deficiency       Past Surgical History:   Procedure Laterality Date   ??? ECHO 2D ADULT  12/04    normal with EF 70%   ??? HX COLONOSCOPY      Dr Rosendo Gros 2007 negative   ??? HX GYN      s/p BTL    ??? HX HEMORRHOIDECTOMY      Dr. Rosendo Gros 2007   ??? HX UROLOGICAL  06/2016    Dr Jimmye Norman; bladder bx showed benign lesions   ??? STRESS TEST THALLIUM STUDY      2005 neg; 1/17 negative ef 75%   ??? [place] ABD COMP  9/05    negative   ??? VAS CAROTID DUPLEX BILATERAL  2004    negative     Current Outpatient Prescriptions   Medication Sig Dispense Refill   ??? VITAMIN D2 50,000 unit capsule take 1 capsule by mouth every week 13 Cap 3   ??? lansoprazole (PREVACID) 30 mg capsule TAKE 1 CAPSULE BY MOUTH BEFORE BREAKFAST DAILY. 90 Cap 3   ??? atorvastatin (LIPITOR) 40 mg tablet take 1 tablet by mouth once daily 90 Tab 3   ??? citalopram (CELEXA) 10 mg tablet take 1 tablet by mouth once daily 90 Tab 3   ??? omega-3 fatty acids (FISH OIL) cap Take 2 Tabs by mouth daily.     ??? multivitamin (ONE A DAY) tablet Take 1 Tab by mouth daily.      ??? aspirin 81 mg chewable tablet Take 81 mg by mouth daily.       Allergies   Allergen Reactions   ??? Fish Oil Nausea Only   ??? Relafen [Nabumetone] Nausea Only     Family History   Problem Relation Age of Onset   ??? Diabetes Mother    ??? Elevated Lipids Father    ??? Stroke Paternal Grandmother    ??? Heart Disease Paternal Grandfather      Social History   Substance Use Topics   ??? Smoking status: Never Smoker   ??? Smokeless tobacco: Never Used   ??? Alcohol use 2.4 oz/week     4 Glasses of wine per week     Depression Risk Factor Screening:     PHQ over the last two weeks  11/10/2016   Little interest or pleasure in doing things Not at all   Feeling down, depressed or hopeless Not at all   Total Score PHQ 2 0     SCREENINGS  Colonoscopy last done 2007 Dr Rosendo Gros  Mammogram last done 9/17  DEXA last done never  Gyn last done >5 yrs     Immunization History   Administered Date(s) Administered   ??? Influenza High Dose Vaccine PF 09/07/2016   ??? Influenza Vaccine 07/27/2011, 07/26/2014, 07/27/2015   ??? Influenza Vaccine PF 08/25/2013   ??? Influenza Vaccine Whole 09/14/2008   ??? Pneumococcal Conjugate (PCV-13) 11/10/2016   ??? TDAP Vaccine 02/09/2011   ??? Zoster Vaccine, Live 03/22/2012     Alcohol Risk Factor Screening:   You do not drink alcohol or very rarely.    Functional Ability and Level of Safety:     Hearing Loss  Hearing is good.    Activities of Daily Living  The home contains: no safety equipment.  Patient does total self care    Fall Risk  Fall Risk Assessment, last 12 mths 11/10/2016   Able to walk? Yes   Fall in past 12 months? No       Abuse Screen  Patient is not abused    Cognitive Screening   Evaluation of Cognitive Function:  Has your family/caregiver stated any concerns about your memory: no  Normal    Patient Care Team   Patient Care Team:  Albin Felling, MD as PCP - General (Internal Medicine)    Assessment/Plan   Education and counseling provided:  Are appropriate based on today's review and evaluation   End-of-Life planning (with patient's consent)  Pneumococcal Vaccine  Influenza Vaccine  Screening Mammography  Colorectal cancer screening tests  Cardiovascular screening blood test  Bone mass measurement (DEXA)  Diabetes screening test    Diagnoses and all orders for this visit:    1. Welcome to Medicare preventive visit    2. Encounter for immunization  -     Pneumococcal conjugate 13 valent, IM (PREVNAR-13)    3. Overweight (BMI 25.0-29.9)    4. Vitamin D deficiency  -     VITAMIN D, 25 HYDROXY; Future    5. Anxiety    6. Dyslipidemia  -     METABOLIC PANEL, COMPREHENSIVE; Future  -     CBC W/O DIFF; Future    7. Osteoarthritis, unspecified osteoarthritis type, unspecified site    8. IFG (impaired fasting glucose)  -     HEMOGLOBIN A1C W/O EAG; Future    9. Screening for alcoholism  -     Annual  Alcohol Screen 15 min [id number]    10. Screening for depression  -     Depression Screen Annual    11. Screen for colon cancer    12. Screening for diabetes mellitus    13. Screening for ischemic heart disease    14. Encounter for screening mammogram for malignant neoplasm of breast    15. Disorder of bone and cartilage  -     Dexa Bone Density Study Axial [id number]); Future    16. Osteopenia, unspecified location  -     Dexa Bone Density Study Axial [id number]); Future         Health Maintenance Due   Topic Date Due   ??? COLONOSCOPY  05/14/2016   ??? GLAUCOMA SCREENING Q2Y  08/29/2016   ??? OSTEOPOROSIS SCREENING (DEXA)  08/29/2016   ??? MEDICARE YEARLY EXAM  08/29/2016

## 2016-11-10 NOTE — ACP (Advance Care Planning) (Signed)
Advance Care Planning    Advance Care Planning (ACP) Provider Note - Comprehensive     Date of ACP Conversation: 11/10/16  Persons included in Conversation:  patient  Length of ACP Conversation in minutes:  16 minutes    Authorized Media planner (if patient is incapable of making informed decisions):   This person is: husband Laura Roman  Other Conservation officer, nature (e.g. Next of Kin)          General ACP for ALL Patients with Decision Making Capacity:   Importance of advance care planning, including choosing a healthcare agent to communicate patient's healthcare decisions if patient lost the ability to make decisions, such as after a sudden illness or accident  Understanding of the healthcare agent role was assessed and information provided  Exploration of values, goals, and preferences if recovery is not expected, even with continued medical treatment in the event of: Imminent death  Severe, permanent brain injury    Review of Existing Advance Directive:  na    For Serious or Chronic Illness:  Understanding of medical condition    Understanding of CPR, goals and expected outcomes, benefits and burdens discussed.    Interventions Provided:  Recommended completion of Advance Directive form after review of ACP materials and conversation with prospective healthcare agent   Recommended communicating the plan and making copies for the healthcare agent, personal physician, and others as appropriate (e.g., health system)  Recommended review of completed ACP document annually or upon change in health status

## 2016-11-11 ENCOUNTER — Other Ambulatory Visit: Payer: Self-pay | Admitting: Family Medicine

## 2016-11-12 ENCOUNTER — Other Ambulatory Visit: Payer: Self-pay

## 2016-11-12 MED ORDER — METFORMIN HCL 500 MG PO TABS: 500.0000 mg | ORAL_TABLET | Freq: Two times a day (BID) | ORAL | 0 refills | Status: DC

## 2016-11-15 NOTE — Discharge Instructions (Signed)
Begin stretching and range of motion exercises.

## 2016-11-17 ENCOUNTER — Ambulatory Visit (INDEPENDENT_AMBULATORY_CARE_PROVIDER_SITE_OTHER): Payer: Medicare Other | Admitting: Adult Health

## 2016-11-17 ENCOUNTER — Encounter: Payer: Self-pay | Admitting: Adult Health

## 2016-11-17 ENCOUNTER — Inpatient Hospital Stay: Admit: 2016-11-17 | Payer: MEDICARE | Attending: Internal Medicine | Primary: Internal Medicine

## 2016-11-17 ENCOUNTER — Ambulatory Visit

## 2016-11-17 VITALS — BP 129/72 | HR 65 | Ht 66.0 in | Wt 176.0 lb

## 2016-11-17 DIAGNOSIS — R413 Other amnesia: Secondary | ICD-10-CM | POA: Diagnosis not present

## 2016-11-17 DIAGNOSIS — Z13820 Encounter for screening for osteoporosis: Secondary | ICD-10-CM

## 2016-11-17 MED ORDER — MEMANTINE HCL 5 MG PO TABS: ORAL_TABLET | ORAL | 5 refills | Status: DC

## 2016-11-17 NOTE — Patient Instructions (Signed)
Memory score is slightly declined  Start Namenda Continue Aricept If your symptoms worsen or you develop new symptoms please let us know.

## 2016-11-17 NOTE — Progress Notes (Signed)
PATIENT: Mia [name] DOB: 05-05-1951  REASON FOR VISIT: follow up- memory HISTORY FROM: patient  HISTORY OF PRESENT ILLNESS: [name] is a 66 year old female with a history of mild memory disturbance. She returns today for follow-up. She is currently on Aricept. She was supposed to start Namenda however she reports that the titration pack was not covered by her insurance.  She continues to live at home alone. She is able to operate a motor vehicle although she reports that she does not drive very often. She manages her own finances. Able to prepare her own meals. Although she has found that she has left the stove on. She has burned food. She is able to complete all ADLs independently. She continues to have some trouble with her gait. She is currently wearing a boot on the left leg for shin splints. She denies any new neurological symptoms. She returns today for an evaluation.  HISTORY 05/14/16: [name] is a 66 year old right-handed white female with a history of a mild memory disturbance. The patient has also sustained a syncopal event, workup was unremarkable. The patient had been on metoprolol and Aricept, the Aricept dose was reduced as this combination could potentially cause significant bradycardia. The patient has not had any further episodes of syncope, she is back to driving at this point. The patient apparently has been placed on prednisone for an essential tremor affecting the hands and head and neck. The patient reports no recent falls, she does have some baseline gait instability. She continues to have some problems with word finding, short-term memory issues, occasionally she may leave on the stove or her curling iron. The patient returns to this office for an evaluation.   REVIEW OF SYSTEMS: Out of a complete 14 system review of symptoms, the patient complains only of the following symptoms, and all other reviewed systems are negative.  Shortness of breath, hearing loss, runny  nose, drooling, apnea, incontinence of bladder, frequency of urination, urgency, joint pain, joint swelling, aching muscles, walking difficulty, memory loss, weakness, tremors, excessive thirst  ALLERGIES: Allergies  Allergen Reactions  . Myrbetriq  [Mirabegron] Other (See Comments)    SEVERE HEADACHE  . Percocet [Oxycodone-Acetaminophen] Nausea And Vomiting  . Celebrex [Celecoxib] Other (See Comments)    Other reaction(s): Other flushed  . Codeine Nausea And Vomiting  . Darvocet [Propoxyphene N-Acetaminophen] Nausea And Vomiting  . Erythromycin Nausea And Vomiting    Nausea and vomiting   . Hydrocodone Nausea And Vomiting  . Lyrica [Pregabalin] Swelling    Legs swelling   . Macrodantin [Nitrofurantoin] Nausea And Vomiting  . Toradol [Ketorolac Tromethamine] Nausea And Vomiting    Per patient, only PO form causes nausea and vomiting. Can take injection without issue  . Tramadol Nausea And Vomiting  . Verapamil Nausea And Vomiting    REACTION: intolerance    HOME MEDICATIONS: Outpatient Medications Prior to Visit  Medication Sig Dispense Refill  . alendronate (FOSAMAX) 70 MG tablet Take 1 tablet by mouth  every 7 days with a full  glass of water on an empty  stomach 12 tablet 4  . amitriptyline (ELAVIL) 25 MG tablet TAKE 1 TABLET BY MOUTH AT  BEDTIME 30 tablet 2  . atorvastatin (LIPITOR) 40 MG tablet Take 1 tablet (40 mg total) by mouth daily. 90 tablet 3  . calcium carbonate (OS-CAL) 600 MG TABS Take 600 mg by mouth daily with breakfast.     . cetirizine (ZYRTEC) 10 MG tablet Take 10 mg by mouth  as needed for allergies.    . Cyanocobalamin (VITAMIN B-12 IJ) Inject 1,000 mg as directed every 28 (twenty-eight) days.    Marland Kitchen donepezil (ARICEPT) 5 MG tablet Take 1 tablet by mouth at  bedtime 90 tablet 3  . fluticasone (FLONASE) 50 MCG/ACT nasal spray Place into both nostrils as needed for allergies or rhinitis.    . furosemide (LASIX) 20 MG tablet Take 1 tablet (20 mg total) by mouth  daily. 90 tablet 3  . gabapentin (NEURONTIN) 300 MG capsule TAKE 1 TO 2 CAPSULES BY MOUTH UP TO TWO TIMES DAILY AS NEEDED 180 capsule 3  . glucose blood test strip Use to test blood sugar once daily. Dx:R73.01 100 each 12  . ipratropium (ATROVENT) 0.03 % nasal spray Place 2 sprays into both nostrils every 12 (twelve) hours as needed for rhinitis.    Marland Kitchen leflunomide (ARAVA) 20 MG tablet TAKE 1 TABLET BY MOUTH  EVERY DAY 90 tablet 0  . meloxicam (MOBIC) 15 MG tablet Take 1 tablet (15 mg total) by mouth every morning. With food. 15 tablet 0  . metFORMIN (GLUCOPHAGE) 500 MG tablet Take 1 tablet (500 mg total) by mouth 2 (two) times daily with a meal. Needs follow up with PCP 180 tablet 0  . methocarbamol (ROBAXIN) 500 MG tablet Take 500 mg by mouth 3 (three) times daily as needed for muscle spasms.    . metoprolol (LOPRESSOR) 50 MG tablet Take 1.5 tablets (75 mg total) by mouth 2 (two) times daily. 270 tablet 3  . NON FORMULARY Place into the nose at bedtime. cpap machine    . ondansetron (ZOFRAN-ODT) 4 MG disintegrating tablet Take 4 mg by mouth every 6 (six) hours as needed for nausea or vomiting.    . pantoprazole (PROTONIX) 40 MG tablet Take 1 tablet (40 mg total) by mouth 2 (two) times daily. 180 tablet 3  . primidone (MYSOLINE) 50 MG tablet Take 1 tablet (50 mg total) by mouth at bedtime. 90 tablet 0  . propranolol (INDERAL) 10 MG tablet Take 1 tablet (10 mg total) by mouth 4 (four) times daily as needed. 60 tablet 6  . SUMAtriptan (IMITREX) 50 MG tablet Take 1 tablet (50 mg total) by mouth every 2 (two) hours as needed. 9 tablet 6  . venlafaxine XR (EFFEXOR-XR) 75 MG 24 hr capsule Take 1 capsule by mouth  daily with breakfast 90 capsule 3  . vitamin C (ASCORBIC ACID) 500 MG tablet Take 500 mg by mouth daily.    . vitamin D, CHOLECALCIFEROL, 400 UNITS tablet Take 400 Units by mouth daily.     Marland Kitchen zinc gluconate 50 MG tablet Take 50 mg by mouth daily.     No facility-administered medications prior  to visit.     PAST MEDICAL HISTORY: Past Medical History:  Diagnosis Date  . Anal fissure   . Anemia   . Arthritis   . Blood transfusion   . C. difficile colitis   . Chest pain    a. 01/2013 MV: EF 59%, no ischemia.  . Chronic Dyspnea    a. 01/2013 Echo: EF 60-65%, Gr 1 DD, PASP 41mmHg.  . Diabetes mellitus   . Fibromyalgia   . Gall stones   . GERD (gastroesophageal reflux disease)    gastritis  . Hemorrhoids   . Hypertension   . Interstitial cystitis   . Memory difficulty 12/14/2013  . Neuromuscular disorder ([place])    sclerosis  . Osteoporosis   . Peptic ulcer   .  PONV (postoperative nausea and vomiting)   . Pseudogout   . Raynaud phenomenon   . Rectal bleeding   . Sleep apnea    a. on cpap.  Marland Kitchen SVT (supraventricular tachycardia) ([place])   . Syncope 09/10/2015  . Thyroid disease    hypothyroidism  . Tremor, essential 05/14/2016    PAST SURGICAL HISTORY: Past Surgical History:  Procedure Laterality Date  . APPENDECTOMY    . BLADDER SURGERY     x2  . BLADDER SURGERY    . BREAST BIOPSY    . CARDIAC CATHETERIZATION    . CATARACT EXTRACTION Bilateral   . CHOLECYSTECTOMY    . DILATION AND CURETTAGE OF UTERUS    . ENTEROCELE REPAIR     x2  . EYE SURGERY     retina  . hysterectomy - unknown type    . KNEE SURGERY     x6  . NECK SURGERY     fusion  . OOPHORECTOMY    . QUADRICEPS REPAIR Right   . RECTOCELE REPAIR     x2  . SHOULDER ARTHROSCOPY WITH SUBACROMIAL DECOMPRESSION, ROTATOR CUFF REPAIR AND BICEP TENDON REPAIR  10/06/2012   Procedure: SHOULDER ARTHROSCOPY WITH SUBACROMIAL DECOMPRESSION, ROTATOR CUFF REPAIR AND BICEP TENDON REPAIR;  Surgeon: Nita Sells, MD;  Location: [facility];  Service: Orthopedics;  Laterality: Right;  Arthroscopic  Repair  of  Subscapularis, Open Biceps Tenodesis  . SHOULDER SURGERY     bilateral- bones spur  . TONSILLECTOMY    . TOTAL KNEE ARTHROPLASTY      FAMILY HISTORY: Family History  Problem  Relation Age of Onset  . Heart attack Mother   . Stroke Mother   . Diabetes Mother   . Heart disease Mother   . Colon polyps Mother   . Breast cancer Maternal Grandmother   . Wilson's disease Maternal Grandmother   . Pancreatic cancer Maternal Grandfather   . Heart disease Father   . Heart attack Father   . Motor neuron disease Sister   . Stroke Sister   . Lupus Sister   . Dementia Sister   . Arthritis/Rheumatoid Sister   . Asthma Sister   . Colon polyps Sister   . Irritable bowel syndrome Sister     SOCIAL HISTORY: Social History   Social History  . Marital status: Married    Spouse name: N/A  . Number of children: 2  . Years of education: 14   Occupational History  . Retired    Social History Main Topics  . Smoking status: Never Smoker  . Smokeless tobacco: Never Used  . Alcohol use No  . Drug use: No  . Sexual activity: Not on file   Other Topics Concern  . Not on file   Social History Narrative   Patient drinks about 3-4 cups of caffeine daily.   Patient is right handed.      PHYSICAL EXAM  Vitals:   11/17/16 0810  Weight: 176 lb (79.8 kg)  Height: 5\' 6"  (1.676 m)   Body mass index is 28.41 kg/m.   MMSE - Mini Mental State Exam 11/17/2016 05/14/2016 11/15/2015  Orientation to time 3 3 5   Orientation to Place 5 5 5   Registration 3 3 3   Attention/ Calculation 5 5 2   Recall 1 3 2   Language- name 2 objects 2 2 2   Language- repeat 1 1 1   Language- follow 3 step command 2 3 2   Language- read & follow direction 1 1 1  Write a sentence 1 1 1   Copy design 1 1 1   Total score 25 28 25      Generalized: Well developed, in no acute distress   Neurological examination  Mentation: Alert oriented to time, place, history taking. Follows all commands speech and language fluent Cranial nerve II-XII: Pupils were equal round reactive to light. Extraocular movements were full, visual field were full on confrontational test. Facial sensation and strength were  normal. Uvula tongue midline. Head turning and shoulder shrug  were normal and symmetric. Motor: The motor testing reveals 5 over 5 strength in all extremities with exception of 4/5 strength in the left lower extremity. Good symmetric motor tone is noted throughout.  Sensory: Sensory testing is intact to soft touch on all 4 extremities. No evidence of extinction is noted.  Coordination: Cerebellar testing reveals good finger-nose-finger and heel-to-shin bilaterally.  Gait and station: Patient is using a crutch to ambulate due to boot on the left leg. Reflexes: Deep tendon reflexes are symmetric and normal bilaterally.   DIAGNOSTIC DATA (LABS, IMAGING, TESTING) - I reviewed patient records, labs, notes, testing and imaging myself where available.  Lab Results  Component Value Date   WBC 7.4 09/04/2016   HGB 12.8 09/04/2016   HCT 39.8 09/04/2016   MCV 94.5 09/04/2016   PLT 398 09/04/2016      Component Value Date/Time   NA 137 09/04/2016 1336   NA 140 11/15/2015 1304   K 4.1 09/04/2016 1336   CL 100 09/04/2016 1336   CO2 27 09/04/2016 1336   GLUCOSE 115 (H) 09/04/2016 1336   BUN 14 09/04/2016 1336   BUN 18 11/15/2015 1304   CREATININE 0.74 09/04/2016 1336   CALCIUM 10.1 09/04/2016 1336   PROT 6.8 09/04/2016 1336   PROT 7.4 11/15/2015 1304   ALBUMIN 4.1 09/04/2016 1336   ALBUMIN 4.8 11/15/2015 1304   AST 21 09/04/2016 1336   ALT 20 09/04/2016 1336   ALKPHOS 85 09/04/2016 1336   BILITOT 0.2 09/04/2016 1336   BILITOT <0.2 11/15/2015 1304   GFRNONAA 85 09/04/2016 1336   GFRAA >89 09/04/2016 1336   Lab Results  Component Value Date   CHOL 133 08/28/2016   HDL 41 (L) 08/28/2016   LDLCALC 70 08/28/2016   TRIG 110 08/28/2016   CHOLHDL 3.2 08/28/2016   Lab Results  Component Value Date   HGBA1C 5.7 07/06/2016   Lab Results  Component Value Date   VITAMINB12 390 04/17/2016   Lab Results  Component Value Date   TSH 0.75 08/28/2016      ASSESSMENT AND PLAN 66 y.o.  year old female  has a past medical history of Anal fissure; Anemia; Arthritis; Blood transfusion; C. difficile colitis; Chest pain; Chronic Dyspnea; Diabetes mellitus; Fibromyalgia; Gall stones; GERD (gastroesophageal reflux disease); Hemorrhoids; Hypertension; Interstitial cystitis; Memory difficulty (12/14/2013); Neuromuscular disorder ([place]); Osteoporosis; Peptic ulcer; PONV (postoperative nausea and vomiting); Pseudogout; Raynaud phenomenon; Rectal bleeding; Sleep apnea; SVT (supraventricular tachycardia) ([place]); Syncope (09/10/2015); Thyroid disease; and Tremor, essential (05/14/2016). here with :  1. memory disturbance  The patient's memory score has declined slightly. She will remain on Aricept. I will send in a Namenda for her. I have reviewed Namenda with the patient. Advised that if her symptoms worsen or she develops new symptoms let us know. She will follow-up in 6 months or sooner if needed.  I spent 15 minutes with the patient. 50% of this time was spent reviewing the medication Namenda.  He Givens, MSN, NP-C 11/17/2016, 8:18 AM  [facility] [address], [address] [place], [place] [address] [phone]   

## 2016-11-17 NOTE — Progress Notes (Signed)
I have read the note, and I agree with the clinical assessment and plan.  WILLIS,CHARLES KEITH   

## 2016-11-21 ENCOUNTER — Other Ambulatory Visit: Payer: Self-pay | Admitting: Family Medicine

## 2016-11-22 NOTE — Telephone Encounter (Signed)
pls call    DEXA shows osteopenia b  ut not qualifying for treatment  Ca, vit d, weight bearing exericse  Recheck dexa in 2-3 years

## 2016-11-23 NOTE — Telephone Encounter (Signed)
LMTCB

## 2016-11-24 NOTE — Telephone Encounter (Signed)
Pt aware of message below and verbalized understanding. No further questions or concerns from pt at this time.

## 2016-11-24 NOTE — Telephone Encounter (Signed)
Pt. Returning call, says you can give her a call back

## 2016-11-26 DIAGNOSIS — N3941 Urge incontinence: Secondary | ICD-10-CM | POA: Diagnosis not present

## 2016-11-26 DIAGNOSIS — R350 Frequency of micturition: Secondary | ICD-10-CM | POA: Diagnosis not present

## 2016-11-30 ENCOUNTER — Telehealth: Payer: Self-pay | Admitting: Emergency Medicine

## 2016-11-30 DIAGNOSIS — G4733 Obstructive sleep apnea (adult) (pediatric): Secondary | ICD-10-CM

## 2016-11-30 NOTE — Telephone Encounter (Signed)
Spoke with pt. She needs an order for CPAP supplies sent to [facility]. Order has been placed. Nothing further was needed.

## 2016-12-01 ENCOUNTER — Telehealth: Payer: Self-pay | Admitting: *Deleted

## 2016-12-01 NOTE — Telephone Encounter (Signed)
Received from [place] [place] that the memantine 5mg  tabs (2 po bid) will exceed quantity limit.  I spoke to pt and she spoke to [place].  This was titration, will renew at 10mg  tabs (taking 1 tab po bid) should go thru.  Pt would like 90 day supply at [facility] when she calls for refill.

## 2016-12-02 ENCOUNTER — Telehealth

## 2016-12-02 NOTE — Telephone Encounter (Signed)
Pt needs and order for a diagnostic bilateral mammogram with ultrasound, RD

## 2016-12-03 ENCOUNTER — Encounter: Payer: Self-pay | Admitting: Sports Medicine

## 2016-12-03 ENCOUNTER — Other Ambulatory Visit: Payer: Self-pay | Admitting: *Deleted

## 2016-12-03 ENCOUNTER — Ambulatory Visit (INDEPENDENT_AMBULATORY_CARE_PROVIDER_SITE_OTHER): Payer: Medicare Other | Admitting: Sports Medicine

## 2016-12-03 ENCOUNTER — Ambulatory Visit: Payer: Medicare Other

## 2016-12-03 VITALS — BP 153/78 | HR 71 | Resp 16

## 2016-12-03 DIAGNOSIS — Z79899 Other long term (current) drug therapy: Secondary | ICD-10-CM | POA: Diagnosis not present

## 2016-12-03 DIAGNOSIS — R350 Frequency of micturition: Secondary | ICD-10-CM | POA: Diagnosis not present

## 2016-12-03 DIAGNOSIS — N3941 Urge incontinence: Secondary | ICD-10-CM | POA: Diagnosis not present

## 2016-12-03 DIAGNOSIS — M79605 Pain in left leg: Secondary | ICD-10-CM

## 2016-12-03 DIAGNOSIS — R3915 Urgency of urination: Secondary | ICD-10-CM | POA: Diagnosis not present

## 2016-12-03 DIAGNOSIS — E538 Deficiency of other specified B group vitamins: Secondary | ICD-10-CM

## 2016-12-03 LAB — CBC WITH DIFFERENTIAL/PLATELET
Basophils Absolute: 70 cells/uL (ref 0–200)
Basophils Relative: 1 %
Eosinophils Absolute: 140 cells/uL (ref 15–500)
Eosinophils Relative: 2 %
HCT: 41.2 % (ref 35.0–45.0)
Hemoglobin: 13.3 g/dL (ref 11.7–15.5)
Lymphocytes Relative: 25 %
Lymphs Abs: 1750 cells/uL (ref 850–3900)
MCH: 29.9 pg (ref 27.0–33.0)
MCHC: 32.3 g/dL (ref 32.0–36.0)
MCV: 92.6 fL (ref 80.0–100.0)
MPV: 10.1 fL (ref 7.5–12.5)
Monocytes Absolute: 770 cells/uL (ref 200–950)
Monocytes Relative: 11 %
Neutro Abs: 4270 cells/uL (ref 1500–7800)
Neutrophils Relative %: 61 %
Platelets: 362 10*3/uL (ref 140–400)
RBC: 4.45 MIL/uL (ref 3.80–5.10)
RDW: 14 % (ref 11.0–15.0)
WBC: 7 10*3/uL (ref 3.8–10.8)

## 2016-12-03 LAB — COMPLETE METABOLIC PANEL WITHOUT GFR
ALT: 18 U/L (ref 6–29)
AST: 19 U/L (ref 10–35)
Albumin: 4.2 g/dL (ref 3.6–5.1)
Alkaline Phosphatase: 79 U/L (ref 33–130)
BUN: 12 mg/dL (ref 7–25)
CO2: 29 mmol/L (ref 20–31)
Calcium: 9.6 mg/dL (ref 8.6–10.4)
Chloride: 101 mmol/L (ref 98–110)
Creat: 0.79 mg/dL (ref 0.50–0.99)
GFR, Est African American: >89 mL/min
GFR, Est Non African American: 79 mL/min
Glucose, Bld: 87 mg/dL (ref 65–99)
Potassium: 4.2 mmol/L (ref 3.5–5.3)
Sodium: 138 mmol/L (ref 135–146)
Total Bilirubin: 0.4 mg/dL (ref 0.2–1.2)
Total Protein: 6.9 g/dL (ref 6.1–8.1)

## 2016-12-03 MED ORDER — CYANOCOBALAMIN 1000 MCG/ML IJ SOLN
1000.0000 ug | Freq: Once | INTRAMUSCULAR | Status: AC
  Administered 2016-12-03: 1000 ug via INTRAMUSCULAR

## 2016-12-03 NOTE — Assessment & Plan Note (Signed)
Persistent pain at the medial tibial border, added couple of scaphoid pads in her boot and we are going to proceed with MRI. Return for MRI results

## 2016-12-03 NOTE — Progress Notes (Signed)
  Subjective:    CC: Recheck leg  HPI: This is a pleasant 66 year female, for a couple of months she's had pain that she localizes along the posterior medial tibial border on the left leg, we have tried boot immobilization, NSAIDs, compression, x-rays were negative, has unfortunately continued to have pain, moderate, persistent, localized with radiation to the dorsal ankle.  Past medical history:  Negative.  See flowsheet/record as well for more information.  Surgical history: Negative.  See flowsheet/record as well for more information.  Family history: Negative.  See flowsheet/record as well for more information.  Social history: Negative.  See flowsheet/record as well for more information.  Allergies, and medications have been entered into the medical record, reviewed, and no changes needed.   Review of Systems: No fevers, chills, night sweats, weight loss, chest pain, or shortness of breath.   Objective:    General: Well Developed, well nourished, and in no acute distress.  Neuro: Alert and oriented x3, extra-ocular muscles intact, sensation grossly intact.  HEENT: Normocephalic, atraumatic, pupils equal round reactive to light, neck supple, no masses, no lymphadenopathy, thyroid nonpalpable.  Skin: Warm and dry, no rashes. Cardiac: Regular rate and rhythm, no murmurs rubs or gallops, no lower extremity edema.  Respiratory: Clear to auscultation bilaterally. Not using accessory muscles, speaking in full sentences. Left leg: Tender to palpation at the posterior medial tibial border, there is really not much swelling.  Impression and Recommendations:    Left leg pain Persistent pain at the medial tibial border, added couple of scaphoid pads in her boot and we are going to proceed with MRI. Return for MRI results  I spent 25 minutes with this patient, greater than 50% was face-to-face time counseling regarding the above diagnoses

## 2016-12-03 NOTE — Telephone Encounter (Signed)
ordered

## 2016-12-03 NOTE — Telephone Encounter (Signed)
Left msg advising pt mammo / ultrasound has been order and central scheduling will be contact her to schedule.

## 2016-12-04 ENCOUNTER — Encounter

## 2016-12-07 ENCOUNTER — Ambulatory Visit (INDEPENDENT_AMBULATORY_CARE_PROVIDER_SITE_OTHER): Payer: Medicare Other

## 2016-12-07 DIAGNOSIS — R600 Localized edema: Secondary | ICD-10-CM | POA: Diagnosis not present

## 2016-12-07 DIAGNOSIS — M79605 Pain in left leg: Secondary | ICD-10-CM

## 2016-12-07 DIAGNOSIS — M898X6 Other specified disorders of bone, lower leg: Secondary | ICD-10-CM

## 2016-12-08 ENCOUNTER — Encounter: Payer: Self-pay | Admitting: Sports Medicine

## 2016-12-08 ENCOUNTER — Ambulatory Visit (INDEPENDENT_AMBULATORY_CARE_PROVIDER_SITE_OTHER): Payer: Medicare Other | Admitting: Sports Medicine

## 2016-12-08 DIAGNOSIS — M84362G Stress fracture, left tibia, subsequent encounter for fracture with delayed healing: Secondary | ICD-10-CM

## 2016-12-08 MED ORDER — AMBULATORY NON FORMULARY MEDICATION: 0 refills | Status: DC

## 2016-12-08 NOTE — Assessment & Plan Note (Signed)
Unfortunately no improvement with CAM boot immobilization for 4 months. MRI confirms stress fracture. Short-leg cast, nonweightbearing with crutches and rolling knee scooter. Return to see me in one month, we will probably obtain an MRI before removing cast.

## 2016-12-08 NOTE — Progress Notes (Signed)
  Subjective:    CC: MRI results  HPI: Mia [name] returns, she's had left lower leg pain for 4 months now, has been in a boot with scaphoid pads, unfortunately continues to have pain throughout the day and at night. Symptoms were consistent with tibial stress fracture so we obtained an MRI that confirmed edema in the medial tibial shaft.  Considering the long duration of her symptoms, and lack of improvement with boot immobilization but normal weightbearing, she agrees to proceed with cast immobilization and complete nonweightbearing with crutches and a rolling knee scooter.  Past medical history:  Negative.  See flowsheet/record as well for more information.  Surgical history: Negative.  See flowsheet/record as well for more information.  Family history: Negative.  See flowsheet/record as well for more information.  Social history: Negative.  See flowsheet/record as well for more information.  Allergies, and medications have been entered into the medical record, reviewed, and no changes needed.   Review of Systems: No fevers, chills, night sweats, weight loss, chest pain, or shortness of breath.   Objective:    General: Well Developed, well nourished, and in no acute distress.  Neuro: Alert and oriented x3, extra-ocular muscles intact, sensation grossly intact.  HEENT: Normocephalic, atraumatic, pupils equal round reactive to light, neck supple, no masses, no lymphadenopathy, thyroid nonpalpable.  Skin: Warm and dry, no rashes. Cardiac: Regular rate and rhythm, no murmurs rubs or gallops, no lower extremity edema.  Respiratory: Clear to auscultation bilaterally. Not using accessory muscles, speaking in full sentences.  Left lower leg cast was applied.  Impression and Recommendations:    Stress fracture of left tibia Unfortunately no improvement with CAM boot immobilization for 4 months. MRI confirms stress fracture. Short-leg cast, nonweightbearing with crutches and rolling knee  scooter. Return to see me in one month, we will probably obtain an MRI before removing cast.  I spent 40 minutes with this patient, greater than 50% was face-to-face time counseling regarding the above diagnoses, this was separate from the time spent applying the cast

## 2016-12-09 ENCOUNTER — Telehealth: Payer: Self-pay

## 2016-12-09 NOTE — Telephone Encounter (Signed)
Pt left VM stating she fell this am with the crutches and she states her arms are very weak and can't use the crutches, and cant use the scooter due to the weakness of the leg. Would like to get a prescription for a wheelchair to [place].

## 2016-12-10 DIAGNOSIS — R3915 Urgency of urination: Secondary | ICD-10-CM | POA: Diagnosis not present

## 2016-12-10 MED ORDER — AMBULATORY NON FORMULARY MEDICATION: 0 refills | Status: DC

## 2016-12-10 NOTE — Telephone Encounter (Signed)
Given to pt spouse.

## 2016-12-10 NOTE — Telephone Encounter (Signed)
Rx will be in box for wheelchair.

## 2016-12-14 ENCOUNTER — Other Ambulatory Visit: Payer: Self-pay | Admitting: *Deleted

## 2016-12-14 MED ORDER — MEMANTINE HCL 10 MG PO TABS: 10.0000 mg | ORAL_TABLET | Freq: Two times a day (BID) | ORAL | 4 refills | Status: DC

## 2016-12-14 NOTE — Telephone Encounter (Signed)
Received request for memantine 10mg  po bid. See previous note.  Pt finishing titration pack.  Refill done for 90 day supply of 10mg  po bid memantine at [facility]

## 2016-12-17 DIAGNOSIS — R350 Frequency of micturition: Secondary | ICD-10-CM | POA: Diagnosis not present

## 2016-12-17 DIAGNOSIS — N3941 Urge incontinence: Secondary | ICD-10-CM | POA: Diagnosis not present

## 2016-12-17 DIAGNOSIS — R3915 Urgency of urination: Secondary | ICD-10-CM | POA: Diagnosis not present

## 2016-12-24 DIAGNOSIS — R350 Frequency of micturition: Secondary | ICD-10-CM | POA: Diagnosis not present

## 2016-12-24 DIAGNOSIS — N3941 Urge incontinence: Secondary | ICD-10-CM | POA: Diagnosis not present

## 2016-12-31 ENCOUNTER — Ambulatory Visit: Payer: Medicare Other

## 2016-12-31 ENCOUNTER — Telehealth

## 2016-12-31 ENCOUNTER — Encounter

## 2016-12-31 DIAGNOSIS — N3941 Urge incontinence: Secondary | ICD-10-CM | POA: Diagnosis not present

## 2016-12-31 DIAGNOSIS — R350 Frequency of micturition: Secondary | ICD-10-CM | POA: Diagnosis not present

## 2016-12-31 DIAGNOSIS — R3915 Urgency of urination: Secondary | ICD-10-CM | POA: Diagnosis not present

## 2016-12-31 NOTE — Telephone Encounter (Signed)
Pt scheduled tomorrow for mammo. Needs order for left breast Ultra sound also.

## 2016-12-31 NOTE — Telephone Encounter (Signed)
There is already an ultrasound order in the system, for some reason its not linking, new order placed. Candy aware

## 2017-01-01 ENCOUNTER — Inpatient Hospital Stay: Admit: 2017-01-01 | Payer: MEDICARE | Attending: Internal Medicine | Primary: Internal Medicine

## 2017-01-01 ENCOUNTER — Encounter

## 2017-01-01 DIAGNOSIS — R928 Other abnormal and inconclusive findings on diagnostic imaging of breast: Secondary | ICD-10-CM

## 2017-01-04 ENCOUNTER — Ambulatory Visit: Payer: PRIVATE HEALTH INSURANCE

## 2017-01-04 ENCOUNTER — Encounter: Payer: Self-pay | Admitting: Family Medicine

## 2017-01-04 ENCOUNTER — Ambulatory Visit (INDEPENDENT_AMBULATORY_CARE_PROVIDER_SITE_OTHER): Payer: Medicare Other | Admitting: Sports Medicine

## 2017-01-04 ENCOUNTER — Ambulatory Visit (INDEPENDENT_AMBULATORY_CARE_PROVIDER_SITE_OTHER): Payer: Medicare Other | Admitting: Family Medicine

## 2017-01-04 VITALS — BP 132/65 | HR 72

## 2017-01-04 DIAGNOSIS — I471 Supraventricular tachycardia, unspecified: Secondary | ICD-10-CM

## 2017-01-04 DIAGNOSIS — I5032 Chronic diastolic (congestive) heart failure: Secondary | ICD-10-CM

## 2017-01-04 DIAGNOSIS — E538 Deficiency of other specified B group vitamins: Secondary | ICD-10-CM

## 2017-01-04 DIAGNOSIS — R7301 Impaired fasting glucose: Secondary | ICD-10-CM

## 2017-01-04 DIAGNOSIS — Z23 Encounter for immunization: Secondary | ICD-10-CM

## 2017-01-04 DIAGNOSIS — I700 Atherosclerosis of aorta: Secondary | ICD-10-CM | POA: Diagnosis not present

## 2017-01-04 DIAGNOSIS — M84362G Stress fracture, left tibia, subsequent encounter for fracture with delayed healing: Secondary | ICD-10-CM

## 2017-01-04 DIAGNOSIS — I10 Essential (primary) hypertension: Secondary | ICD-10-CM | POA: Diagnosis not present

## 2017-01-04 DIAGNOSIS — K529 Noninfective gastroenteritis and colitis, unspecified: Secondary | ICD-10-CM | POA: Diagnosis not present

## 2017-01-04 DIAGNOSIS — M5136 Other intervertebral disc degeneration, lumbar region: Secondary | ICD-10-CM | POA: Diagnosis not present

## 2017-01-04 DIAGNOSIS — M51369 Other intervertebral disc degeneration, lumbar region without mention of lumbar back pain or lower extremity pain: Secondary | ICD-10-CM

## 2017-01-04 LAB — POCT GLYCOSYLATED HEMOGLOBIN (HGB A1C): Hemoglobin A1C: 5.7

## 2017-01-04 MED ORDER — CYANOCOBALAMIN 1000 MCG/ML IJ SOLN
1000.0000 ug | Freq: Once | INTRAMUSCULAR | Status: AC
  Administered 2017-01-04: 1000 ug via INTRAMUSCULAR

## 2017-01-04 MED ORDER — CYCLOBENZAPRINE HCL 10 MG PO TABS: ORAL_TABLET | ORAL | 0 refills | Status: DC

## 2017-01-04 MED ORDER — METFORMIN HCL ER 500 MG PO TB24: 500.0000 mg | ORAL_TABLET | Freq: Every day | ORAL | 0 refills | Status: DC

## 2017-01-04 NOTE — Assessment & Plan Note (Addendum)
Axial midline discogenic back pain with multilevel degenerative changes seen on x-ray. Has failed greater than 6 weeks of physician directed conservative measures, proceeding with MRI for epidural planning. In the meantime we will discontinue Robaxin and switch to Flexeril, this is related chemically to amitriptyline so she will get some pain relief.

## 2017-01-04 NOTE — Progress Notes (Signed)
   Subjective:    I'm seeing this patient as a consultation for:   Dr. [name]  CC: Low back pain  HPI: This is a 66 year old female, for decades she's had axial low back pain, worse with sitting, flexion, nothing radicular, no bowel or bladder dysfunction, saddle numbness, constitutional symptoms, she's done far greater than 6 weeks of physician directed conservative measures, had x-rays and an MRI in the past, her most recent x-rays have showed multilevel degenerative changes. Nothing has worked so far.  Tibial stress fracture: Has been in the cast now and nonweightbearing for one month, starting to show improvement. She does have some areas on the cast that seemed to be rubbing her skin.  Past medical history:  Negative.  See flowsheet/record as well for more information.  Surgical history: Negative.  See flowsheet/record as well for more information.  Family history: Negative.  See flowsheet/record as well for more information.  Social history: Negative.  See flowsheet/record as well for more information.  Allergies, and medications have been entered into the medical record, reviewed, and no changes needed.   Review of Systems: No headache, visual changes, nausea, vomiting, diarrhea, constipation, dizziness, abdominal pain, skin rash, fevers, chills, night sweats, weight loss, swollen lymph nodes, body aches, joint swelling, muscle aches, chest pain, shortness of breath, mood changes, visual or auditory hallucinations.   Objective:   General: Well Developed, well nourished, and in no acute distress.  Neuro/Psych: Alert and oriented x3, extra-ocular muscles intact, able to move all 4 extremities, sensation grossly intact. Skin: Warm and dry, no rashes noted.  Respiratory: Not using accessory muscles, speaking in full sentences, trachea midline.  Cardiovascular: Pulses palpable, no extremity edema. Abdomen: Does not appear distended. Back Exam:  Inspection: Unremarkable    Motion: Flexion 45 deg, Extension 45 deg, Side Bending to 45 deg bilaterally,  Rotation to 45 deg bilaterally  SLR laying: Negative  XSLR laying: Negative  Palpable tenderness: None. FABER: negative. Sensory change: Gross sensation intact to all lumbar and sacral dermatomes.  Reflexes: 2+ at both patellar tendons, 2+ at achilles tendons, Babinski's downgoing.  Strength at foot  Plantar-flexion: 5/5 Dorsi-flexion: 5/5 Eversion: 5/5 Inversion: 5/5  Leg strength  Quad: 5/5 Hamstring: 5/5 Hip flexor: 5/5 Hip abductors: 5/5  Gait unremarkable. Left leg: Cast is in good condition, I did trim the cast over the dorsum of the forefoot.  Impression and Recommendations:   This case required medical decision making of moderate complexity.  Lumbar degenerative disc disease Axial midline discogenic back pain with multilevel degenerative changes seen on x-ray. Has failed greater than 6 weeks of physician directed conservative measures, proceeding with MRI for epidural planning.  Stress fracture of left tibia MRI confirmed, she had no improvement with CAM boot immobilization for 4 months, we proceeded with rigid cast and complete nonweightbearing, and over the past month she has improved significantly. I am going to continue to cast for an additional month, I did trim it over the dorsum of her forefoot. Return to see me in one month.

## 2017-01-04 NOTE — Progress Notes (Signed)
Subjective:    CC: b12 injection, and other issues  HPI:  Pernicious anemia-due for B12 injection today.  Hypertension- Pt denies chest pain, SOB, dizziness, or heart palpitations.  Taking meds as directed w/o problems.  Denies medication side effects.    Chronic diastolic heart failure- no swelling.    SVT-currently on metoprolol for rate control.They did recently increase her metoprolol to 75 mg twice a day from 50. She feels like her heart rate is better controlled on the increased dose.  IFG - Currently on metformin.No inc thrist or urination.  Lab Results  Component Value Date   HGBA1C 5.7 07/06/2016   She's also still battling chronic diarrhea. 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 She said she still has a normal bowel movement movement in almost a year. She seeing Dr. Cindee Salt at the end of the month. She often has multiple bowel movements in a day.  She's also been having more difficulty with her Raynaud's over the last couple of weeks. Particularly affecting her distal feet and hands.  Past medical history, Surgical history, Family history not pertinant except as noted below, Social history, Allergies, and medications have been entered into the medical record, reviewed, and corrections made.   Review of Systems: No fevers, chills, night sweats, weight loss, chest pain, or shortness of breath.   Objective:    General: Well Developed, well nourished, and in no acute distress.  Neuro: Alert and oriented x3, extra-ocular muscles intact, sensation grossly intact.  HEENT: Normocephalic, atraumatic  Skin: Warm and dry, no rashes. Cardiac: Regular rate and rhythm, no murmurs rubs or gallops, no lower extremity edema.  Respiratory: Clear to auscultation bilaterally. Not using accessory muscles, speaking in full sentences.   Impression and Recommendations:    Pernicious anemia-B12 injection given today.  HTN - Well controlled. Continue  current regimen. Follow up in  6 months.    IFG - Stable and well controlled at 5.7. Continue with metformin but will change to extended release. Follow-up in 6 months.  SVT - doing well on 75 mg metoprolol twice a day.  Diastolic Heart Failure  - stable. Asymptomatic.   Chronic diarrhea-we'll try changing metformin to extended release.

## 2017-01-04 NOTE — Assessment & Plan Note (Signed)
MRI confirmed, she had no improvement with CAM boot immobilization for 4 months, we proceeded with rigid cast and complete nonweightbearing, and over the past month she has improved significantly. I am going to continue to cast for an additional month, I did trim it over the dorsum of her forefoot. Return to see me in one month.

## 2017-01-05 ENCOUNTER — Ambulatory Visit: Payer: PRIVATE HEALTH INSURANCE | Admitting: Sports Medicine

## 2017-01-11 ENCOUNTER — Ambulatory Visit (INDEPENDENT_AMBULATORY_CARE_PROVIDER_SITE_OTHER): Payer: Medicare Other | Admitting: Podiatry

## 2017-01-11 ENCOUNTER — Ambulatory Visit (INDEPENDENT_AMBULATORY_CARE_PROVIDER_SITE_OTHER): Payer: Medicare Other

## 2017-01-11 ENCOUNTER — Encounter: Payer: Self-pay | Admitting: Podiatry

## 2017-01-11 DIAGNOSIS — M48061 Spinal stenosis, lumbar region without neurogenic claudication: Secondary | ICD-10-CM | POA: Diagnosis not present

## 2017-01-11 DIAGNOSIS — L608 Other nail disorders: Secondary | ICD-10-CM | POA: Diagnosis not present

## 2017-01-11 DIAGNOSIS — M79676 Pain in unspecified toe(s): Secondary | ICD-10-CM

## 2017-01-11 DIAGNOSIS — M79609 Pain in unspecified limb: Secondary | ICD-10-CM

## 2017-01-11 DIAGNOSIS — L600 Ingrowing nail: Secondary | ICD-10-CM | POA: Diagnosis not present

## 2017-01-11 DIAGNOSIS — L603 Nail dystrophy: Secondary | ICD-10-CM | POA: Diagnosis not present

## 2017-01-11 DIAGNOSIS — L03039 Cellulitis of unspecified toe: Secondary | ICD-10-CM | POA: Diagnosis not present

## 2017-01-11 DIAGNOSIS — B351 Tinea unguium: Secondary | ICD-10-CM

## 2017-01-11 DIAGNOSIS — M5136 Other intervertebral disc degeneration, lumbar region: Secondary | ICD-10-CM

## 2017-01-11 DIAGNOSIS — M5126 Other intervertebral disc displacement, lumbar region: Secondary | ICD-10-CM | POA: Diagnosis not present

## 2017-01-11 DIAGNOSIS — M479 Spondylosis, unspecified: Secondary | ICD-10-CM

## 2017-01-11 NOTE — Progress Notes (Signed)
   Subjective: Patient presents today for evaluation of pain in toe(s). Patient is concerned for possible ingrown nail. Patient states that the pain has been present for a few weeks now. Patient presents today for further treatment and evaluation.  Objective:  General: Well developed, nourished, in no acute distress, alert and oriented x3   Dermatology: Skin is warm, dry and supple bilateral. Right great toe medial border appears to be erythematous with evidence of an ingrowing nail.  Pain on palpation noted to the border of the nail fold. The remaining nails appear unremarkable at this time. There are no open sores, lesions.  Vascular: Dorsalis Pedis artery and Posterior Tibial artery pedal pulses palpable. No lower extremity edema noted.   Neruologic: Grossly intact via light touch bilateral.  Musculoskeletal: Muscular strength within normal limits in all groups bilateral. Normal range of motion noted to all pedal and ankle joints.   Assesement: #1 Paronychia with ingrowing nail right great toe medial border #2 Incurvated nail #3 Onychodystrophic nails 1-5 bilateral #4 possible fungal nails 1-5 bilateral  Plan of Care:  1. Patient evaluated.  2. Discussed treatment alternatives and plan of care. Explained nail avulsion procedure and post procedure course to patient. 3. Patient opted for permanent partial nail avulsion.  4. Prior to procedure, local anesthesia infiltration utilized using 3 ml of a 50:50 mixture of 2% plain lidocaine and 0.5% plain marcaine in a normal hallux block fashion and a betadine prep performed.  5. Partial permanent nail avulsion with chemical matrixectomy performed using [id number] applications of phenol followed by alcohol flush.  6. Light dressing applied. 7. Nail biopsy performed to the right great toenail and second digit right foot and sent to pathology for fungal culture 8. Mechanical debridement of nails 1 through 5 bilaterally was performed using a nail  nipper without incident or bleeding. 9. Return to clinic in 4 weeks to review fungal nail biopsy results and follow-up ingrown  Patient has a cast the left lower extremity due to bone marrow edema. Patient is being treated by Dr. Minus Liberty, sports medicine physician and [facility] Ser.  Edrick Kins, DPM [facility]  Dr. Edrick Kins, [address]                                        [place], [place] [address]                Office [phone]  Fax [phone]

## 2017-01-11 NOTE — Patient Instructions (Signed)

## 2017-01-11 NOTE — Addendum Note (Signed)
Addended by: Johnnye Lana A on: 01/11/2017 05:57 PM   Modules accepted: Orders

## 2017-01-14 ENCOUNTER — Encounter: Primary: Internal Medicine

## 2017-01-14 DIAGNOSIS — N3941 Urge incontinence: Secondary | ICD-10-CM | POA: Diagnosis not present

## 2017-01-14 DIAGNOSIS — R350 Frequency of micturition: Secondary | ICD-10-CM | POA: Diagnosis not present

## 2017-01-15 ENCOUNTER — Ambulatory Visit (INDEPENDENT_AMBULATORY_CARE_PROVIDER_SITE_OTHER): Payer: Medicare Other | Admitting: Sports Medicine

## 2017-01-15 ENCOUNTER — Encounter: Payer: Self-pay | Admitting: Sports Medicine

## 2017-01-15 ENCOUNTER — Telehealth: Payer: Self-pay | Admitting: Family Medicine

## 2017-01-15 DIAGNOSIS — M51369 Other intervertebral disc degeneration, lumbar region without mention of lumbar back pain or lower extremity pain: Secondary | ICD-10-CM

## 2017-01-15 DIAGNOSIS — M5136 Other intervertebral disc degeneration, lumbar region: Secondary | ICD-10-CM

## 2017-01-15 NOTE — Telephone Encounter (Signed)
Pt informed of form completion.Mia [name]

## 2017-01-15 NOTE — Assessment & Plan Note (Signed)
Pain is today described is axial and facetogenic. Left lower back and buttock. She does have a clear arthritic facet on the left at L5-S1. She also has some disc protrusions higher up in the lumbar spine. We are to proceed at this point with a left L5-S1 facet joint injection for diagnostic and therapeutic purposes. Return to see me in one month with evaluate response. Certainly an epidural higher up would be an option should the facet joint injection not provide any relief.

## 2017-01-15 NOTE — Progress Notes (Signed)
  Subjective:    CC: Follow-up  HPI: Low back pain: Has failed conservative measures including physical therapy, back pain today has manifested itself in the lower left lumbar spine with radiation into the buttocks, worse with standing. Really doesn't have much pain now with Valsalva, sitting, flexion. No bowel or bladder dysfunction, saddle numbness, no constitutional symptoms, no trauma.  Left tibial stress fracture: Overall doing well, continues to improve little by little in the cast, ultimately she has 2 more weeks in the cast.  Past medical history:  Negative.  See flowsheet/record as well for more information.  Surgical history: Negative.  See flowsheet/record as well for more information.  Family history: Negative.  See flowsheet/record as well for more information.  Social history: Negative.  See flowsheet/record as well for more information.  Allergies, and medications have been entered into the medical record, reviewed, and no changes needed.   Review of Systems: No fevers, chills, night sweats, weight loss, chest pain, or shortness of breath.   Objective:    General: Well Developed, well nourished, and in no acute distress.  Neuro: Alert and oriented x3, extra-ocular muscles intact, sensation grossly intact.  HEENT: Normocephalic, atraumatic, pupils equal round reactive to light, neck supple, no masses, no lymphadenopathy, thyroid nonpalpable.  Skin: Warm and dry, no rashes. Cardiac: Regular rate and rhythm, no murmurs rubs or gallops, no lower extremity edema.  Respiratory: Clear to auscultation bilaterally. Not using accessory muscles, speaking in full sentences.  MRI personally reviewed and shows multilevel protruding discs more the mid to upper lumbar spine, she has clear facet arthritis at the left L5-S1 facet joint.  Impression and Recommendations:    Lumbar degenerative disc disease Pain is today described is axial and facetogenic. Left lower back and buttock. She  does have a clear arthritic facet on the left at L5-S1. She also has some disc protrusions higher up in the lumbar spine. We are to proceed at this point with a left L5-S1 facet joint injection for diagnostic and therapeutic purposes. Return to see me in one month with evaluate response. Certainly an epidural higher up would be an option should the facet joint injection not provide any relief.  I spent 25 minutes with this patient, greater than 50% was face-to-face time counseling regarding the above diagnoses
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
## 2017-01-18 ENCOUNTER — Other Ambulatory Visit: Payer: Self-pay | Admitting: *Deleted

## 2017-01-18 MED ORDER — METFORMIN HCL ER 500 MG PO TB24: 500.0000 mg | ORAL_TABLET | Freq: Every day | ORAL | 1 refills | Status: DC

## 2017-01-19 ENCOUNTER — Other Ambulatory Visit: Payer: Self-pay | Admitting: *Deleted

## 2017-01-19 MED ORDER — METFORMIN HCL ER 500 MG PO TB24: 500.0000 mg | ORAL_TABLET | Freq: Every day | ORAL | 1 refills | Status: DC

## 2017-01-20 ENCOUNTER — Encounter: Attending: Urology | Primary: Internal Medicine

## 2017-01-21 DIAGNOSIS — A09 Infectious gastroenteritis and colitis, unspecified: Secondary | ICD-10-CM | POA: Diagnosis not present

## 2017-01-21 DIAGNOSIS — R197 Diarrhea, unspecified: Secondary | ICD-10-CM | POA: Diagnosis not present

## 2017-01-21 DIAGNOSIS — N3941 Urge incontinence: Secondary | ICD-10-CM | POA: Diagnosis not present

## 2017-01-26 ENCOUNTER — Ambulatory Visit (INDEPENDENT_AMBULATORY_CARE_PROVIDER_SITE_OTHER): Payer: Medicare Other | Admitting: Sports Medicine

## 2017-01-26 ENCOUNTER — Telehealth: Payer: Self-pay

## 2017-01-26 VITALS — BP 136/84 | HR 109

## 2017-01-26 DIAGNOSIS — R52 Pain, unspecified: Secondary | ICD-10-CM

## 2017-01-26 MED ORDER — KETOROLAC TROMETHAMINE 30 MG/ML IJ SOLN: 30.0000 mg | Freq: Once | INTRAMUSCULAR | Status: AC

## 2017-01-26 NOTE — Telephone Encounter (Signed)
Pt is coming in for nurse visit for toradol injection.  She is concerned because her pain has been constant since Thursday night.  It is severe with any pain.  She is questioning if she could have injured herself further.  She said her pain is from her waist on the left side down her buttocks and into the front of her thigh. Explained that she should call [facility] to move up her injection.

## 2017-01-26 NOTE — Telephone Encounter (Signed)
I spoke with Mia [name], she is coming in for a toradol shot.  She stated that the pain has been constant since Thursday night, and sharp pain with any movement.  She is wondering if she could have injured herself further.

## 2017-01-26 NOTE — Progress Notes (Signed)
Pt called earlier and said that he was having severe pain in the lower back into the hip.  Dr T wanted her to make an nurse appointment for toradol 30 mg injection.  She is here for the injection.  Pt has toradol on allergy list, but pt stated that she can have injection, but not po.  Verified with patient several times.   Pt tolerated injection well in the right deltoid and without complications.

## 2017-01-26 NOTE — Telephone Encounter (Signed)
No, she did not injure herself further. Its likely more the anticipation of having to wait for the upcoming injection

## 2017-01-26 NOTE — Telephone Encounter (Signed)
I would be okay with a nurse visit for a shot of Toradol 30 mg.  My other advice is that she should call  imaging to see if they can move her appointment up

## 2017-01-26 NOTE — Telephone Encounter (Signed)
Pt left VM stating she can not get her epidural until Friday but she is in severe pain. Would like to know if she can get an injection so she can make it to appointment.

## 2017-01-29 ENCOUNTER — Ambulatory Visit
Admission: RE | Admit: 2017-01-29 | Discharge: 2017-01-29 | Disposition: A | Discharge status: Home / Self Care | Payer: Medicare Other | Source: Ambulatory Visit | Attending: Sports Medicine | Admitting: Sports Medicine

## 2017-01-29 ENCOUNTER — Other Ambulatory Visit: Payer: Self-pay | Admitting: *Deleted

## 2017-01-29 ENCOUNTER — Other Ambulatory Visit: Payer: Self-pay | Admitting: Family Medicine

## 2017-01-29 DIAGNOSIS — M545 Low back pain: Secondary | ICD-10-CM | POA: Diagnosis not present

## 2017-01-29 MED ORDER — ONETOUCH ULTRA SYSTEM W/DEVICE KIT: 1.0000 | PACK | Freq: Once | 0 refills | Status: AC

## 2017-01-29 MED ORDER — IOPAMIDOL (ISOVUE-M 200) INJECTION 41%
1.0000 mL | Freq: Once | INTRAMUSCULAR | Status: AC
  Administered 2017-01-29: 1 mL via INTRA_ARTICULAR

## 2017-01-29 MED ORDER — METHYLPREDNISOLONE ACETATE 40 MG/ML INJ SUSP (RADIOLOG
120.0000 mg | Freq: Once | INTRAMUSCULAR | Status: AC
  Administered 2017-01-29: 120 mg via INTRA_ARTICULAR

## 2017-01-29 NOTE — Discharge Instructions (Signed)

## 2017-01-29 NOTE — Progress Notes (Signed)
on

## 2017-02-01 ENCOUNTER — Encounter: Payer: Self-pay | Admitting: Sports Medicine

## 2017-02-01 ENCOUNTER — Ambulatory Visit: Payer: PRIVATE HEALTH INSURANCE

## 2017-02-01 ENCOUNTER — Ambulatory Visit (INDEPENDENT_AMBULATORY_CARE_PROVIDER_SITE_OTHER): Payer: Medicare Other | Admitting: Sports Medicine

## 2017-02-01 VITALS — BP 140/83 | HR 112 | Resp 18

## 2017-02-01 DIAGNOSIS — M5136 Other intervertebral disc degeneration, lumbar region: Secondary | ICD-10-CM | POA: Diagnosis not present

## 2017-02-01 DIAGNOSIS — M84362G Stress fracture, left tibia, subsequent encounter for fracture with delayed healing: Secondary | ICD-10-CM

## 2017-02-01 DIAGNOSIS — E538 Deficiency of other specified B group vitamins: Secondary | ICD-10-CM

## 2017-02-01 DIAGNOSIS — M51369 Other intervertebral disc degeneration, lumbar region without mention of lumbar back pain or lower extremity pain: Secondary | ICD-10-CM

## 2017-02-01 MED ORDER — KETOROLAC TROMETHAMINE 30 MG/ML IJ SOLN
30.0000 mg | Freq: Once | INTRAMUSCULAR | Status: AC
  Administered 2017-02-01: 30 mg via INTRAMUSCULAR

## 2017-02-01 MED ORDER — CYANOCOBALAMIN 1000 MCG/ML IJ SOLN
1000.0000 ug | Freq: Once | INTRAMUSCULAR | Status: AC
  Administered 2017-02-01: 1000 ug via INTRAMUSCULAR

## 2017-02-01 NOTE — Assessment & Plan Note (Signed)
The clear left arthritic L5-S1 facet was injected, she did extremely well. She does have some pain on the right side, at this point we are just going to remove her cast and see how things go when she is able to ambulate normally. Persistent pain then certainly epidurals higher up could be an option.

## 2017-02-01 NOTE — Progress Notes (Signed)
  Subjective:    CC: Follow-up  HPI: Tibial stress fracture: Here to have cast removed  Low back pain: Did well after facet joint injection, left-sided, L5-S1, still has some right-sided back pain that she agrees to discuss at a future visit.  Past medical history:  Negative.  See flowsheet/record as well for more information.  Surgical history: Negative.  See flowsheet/record as well for more information.  Family history: Negative.  See flowsheet/record as well for more information.  Social history: Negative.  See flowsheet/record as well for more information.  Allergies, and medications have been entered into the medical record, reviewed, and no changes needed.   Review of Systems: No fevers, chills, night sweats, weight loss, chest pain, or shortness of breath.   Objective:    General: Well Developed, well nourished, and in no acute distress.  Neuro: Alert and oriented x3, extra-ocular muscles intact, sensation grossly intact.  HEENT: Normocephalic, atraumatic, pupils equal round reactive to light, neck supple, no masses, no lymphadenopathy, thyroid nonpalpable.  Skin: Warm and dry, no rashes. Cardiac: Regular rate and rhythm, no murmurs rubs or gallops, no lower extremity edema.  Respiratory: Clear to auscultation bilaterally. Not using accessory muscles, speaking in full sentences.  Cast is removed. No longer tender along the medial tibia.  Impression and Recommendations:    Stress fracture of left tibia After 4 months of CAM boot immobilization and 2 months of cast immobilization she has finally improved, cast was removed today.  Lumbar degenerative disc disease The clear left arthritic L5-S1 facet was injected, she did extremely well. She does have some pain on the right side, at this point we are just going to remove her cast and see how things go when she is able to ambulate normally. Persistent pain then certainly epidurals higher up could be an option.  I spent 25  minutes with this patient, greater than 50% was face-to-face time counseling regarding the above diagnoses

## 2017-02-01 NOTE — Assessment & Plan Note (Signed)
After 4 months of CAM boot immobilization and 2 months of cast immobilization she has finally improved, cast was removed today.

## 2017-02-02 ENCOUNTER — Other Ambulatory Visit: Payer: Self-pay

## 2017-02-02 MED ORDER — AMBULATORY NON FORMULARY MEDICATION: 11 refills | Status: DC

## 2017-02-03 ENCOUNTER — Telehealth: Payer: Self-pay | Admitting: Pharmacist

## 2017-02-03 NOTE — Telephone Encounter (Signed)
Received fax from [facility] for lefunomide tier exception.  I called patient who reports leflunomide cost increased this year.  I verified with her insurance that the medication is listed as tier 3.  Completed tier exception form and faxed to patient's insurance.  Will update patient once I know status of tier exception request.    [name] Most, Pharm.D., BCPS, CPP Clinical Pharmacist Pager: [phone] Phone: [phone] 02/03/2017 9:04 AM

## 2017-02-04 ENCOUNTER — Other Ambulatory Visit: Payer: Self-pay | Admitting: *Deleted

## 2017-02-04 DIAGNOSIS — N3941 Urge incontinence: Secondary | ICD-10-CM | POA: Diagnosis not present

## 2017-02-04 MED ORDER — AMBULATORY NON FORMULARY MEDICATION: 11 refills | Status: DC

## 2017-02-08 ENCOUNTER — Ambulatory Visit (INDEPENDENT_AMBULATORY_CARE_PROVIDER_SITE_OTHER): Payer: Medicare Other | Admitting: Podiatry

## 2017-02-08 ENCOUNTER — Encounter: Payer: Self-pay | Admitting: Pharmacist

## 2017-02-08 DIAGNOSIS — S86892A Other injury of other muscle(s) and tendon(s) at lower leg level, left leg, initial encounter: Secondary | ICD-10-CM | POA: Diagnosis not present

## 2017-02-08 DIAGNOSIS — B351 Tinea unguium: Secondary | ICD-10-CM | POA: Diagnosis not present

## 2017-02-08 DIAGNOSIS — L600 Ingrowing nail: Secondary | ICD-10-CM | POA: Diagnosis not present

## 2017-02-08 MED ORDER — CITALOPRAM 10 MG TAB
10 mg | ORAL_TABLET | ORAL | 3 refills | Status: DC
Start: 2017-02-08 — End: 2017-12-19

## 2017-02-08 NOTE — Progress Notes (Signed)
Leflunomide tier exception was denied by patient's insurance.  Will attempt to appeal decision.  I have updated patient.    Letter was reviewed and signed by Dr. [name].  Faxed appeal to patient's insurance.  Will update patient once I know decision.

## 2017-02-10 NOTE — Progress Notes (Signed)
   Subjective: Patient presents today post ingrown nail permanent nail avulsion procedure. Patient states that the toe and nail fold is feeling much better. She reports some associated bleeding and clear discharged from the right great toe.  Objective: Skin is warm, dry and supple. Nail and respective nail fold appears to be healing appropriately. Open wound to the associated nail fold with a granular wound base and moderate amount of fibrotic tissue. Minimal drainage noted. Mild erythema around the periungual region likely due to phenol chemical matricectomy. Pain is also noted to the medial anterior aspect of the tibia left lower extremity consistent with shin splints, or medial tibial stress syndrome.  Assessment: #1 postop permanent partial nail avulsion medial border of right great toe #2 open wound periungual nail fold of respective digit. 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 Antibiotic ointment and Band-Aid was applied. #3 are office will call the patient for fungal nail results #4 appt with routine nail care doctors Prudence Davidson or Tuchman #5 patient is to return to clinic on a PRN  basis.   Edrick Kins, DPM [facility]  Dr. Edrick Kins, [address]                                        [place], [place] [address]                Office [phone]  Fax [phone]

## 2017-02-11 ENCOUNTER — Telehealth: Payer: Self-pay | Admitting: Rheumatology

## 2017-02-11 DIAGNOSIS — N3941 Urge incontinence: Secondary | ICD-10-CM | POA: Diagnosis not present

## 2017-02-11 DIAGNOSIS — R350 Frequency of micturition: Secondary | ICD-10-CM | POA: Diagnosis not present

## 2017-02-11 MED ORDER — LEFLUNOMIDE 20 MG PO TABS: 20.0000 mg | ORAL_TABLET | Freq: Every day | ORAL | 0 refills | Status: DC

## 2017-02-11 NOTE — Telephone Encounter (Signed)
I advised Mia [name] that the tiering exception was approved.  Patient requested a refill of her medication.    Last visit: 09/30/16 Next visit: 03/01/17 Labs: 12/03/16 normal  Okay to refill leflunomide?

## 2017-02-11 NOTE — Telephone Encounter (Signed)
Mia [name] from [place] called to let us know that they did approve the appeal.  [id number].  Thank you.

## 2017-02-11 NOTE — Telephone Encounter (Signed)
ok 

## 2017-02-15 ENCOUNTER — Ambulatory Visit (HOSPITAL_BASED_OUTPATIENT_CLINIC_OR_DEPARTMENT_OTHER)
Admission: RE | Admit: 2017-02-15 | Discharge: 2017-02-15 | Disposition: A | Discharge status: Home / Self Care | Payer: Medicare Other | Source: Ambulatory Visit | Attending: Sports Medicine | Admitting: Sports Medicine

## 2017-02-15 ENCOUNTER — Ambulatory Visit (INDEPENDENT_AMBULATORY_CARE_PROVIDER_SITE_OTHER): Payer: Medicare Other | Admitting: Sports Medicine

## 2017-02-15 DIAGNOSIS — M84362G Stress fracture, left tibia, subsequent encounter for fracture with delayed healing: Secondary | ICD-10-CM

## 2017-02-15 DIAGNOSIS — M51369 Other intervertebral disc degeneration, lumbar region without mention of lumbar back pain or lower extremity pain: Secondary | ICD-10-CM

## 2017-02-15 DIAGNOSIS — M79661 Pain in right lower leg: Secondary | ICD-10-CM | POA: Diagnosis not present

## 2017-02-15 DIAGNOSIS — M5136 Other intervertebral disc degeneration, lumbar region: Secondary | ICD-10-CM

## 2017-02-15 DIAGNOSIS — M7989 Other specified soft tissue disorders: Secondary | ICD-10-CM | POA: Diagnosis not present

## 2017-02-15 NOTE — Assessment & Plan Note (Signed)
Doing well out of cast, she has developed swelling in the entire left lower extremity with a positive Homans sign concerning for a DVT. Stat lower extremity Dopplers.

## 2017-02-15 NOTE — Assessment & Plan Note (Signed)
Did well after injection of the clear left arthritic L5-S1 facet joint. She did have some pain on the right side but tells me that we can continue to watch this for now. Certainly epidurals higher up would also be an option.

## 2017-02-15 NOTE — Progress Notes (Signed)
  Subjective:    CC: Follow-up  HPI: Left tibial stress fracture: Mia [name] returns, she was in a cast for a long time, overall things improved but more recently she developed increasing swelling and pain in the calf on the left lower leg.  Low back pain: Did well with a left L5-S1 facet joint injection, she was having some increasing pain on the right side, of a similar nature. This improved when she discontinued the cast, but she would like to hold off on a right-sided facet joint injection for now.  Past medical history:  Negative.  See flowsheet/record as well for more information.  Surgical history: Negative.  See flowsheet/record as well for more information.  Family history: Negative.  See flowsheet/record as well for more information.  Social history: Negative.  See flowsheet/record as well for more information.  Allergies, and medications have been entered into the medical record, reviewed, and no changes needed.   Review of Systems: No fevers, chills, night sweats, weight loss, chest pain, or shortness of breath.   Objective:    General: Well Developed, well nourished, and in no acute distress.  Neuro: Alert and oriented x3, extra-ocular muscles intact, sensation grossly intact.  HEENT: Normocephalic, atraumatic, pupils equal round reactive to light, neck supple, no masses, no lymphadenopathy, thyroid nonpalpable.  Skin: Warm and dry, no rashes. Cardiac: Regular rate and rhythm, no murmurs rubs or gallops, no lower extremity edema.  Respiratory: Clear to auscultation bilaterally. Not using accessory muscles, speaking in full sentences. Left leg: Swollen, good dorsalis pedis and posterior tibial pulses, positive Homans sign. Pitting edema throughout the entire left lower leg.  Impression and Recommendations:    Stress fracture of left tibia Doing well out of cast, she has developed swelling in the entire left lower extremity with a positive Homans sign concerning for a DVT. Stat  lower extremity Dopplers.  Lumbar degenerative disc disease Did well after injection of the clear left arthritic L5-S1 facet joint. She did have some pain on the right side but tells me that we can continue to watch this for now. Certainly epidurals higher up would also be an option.

## 2017-02-23 ENCOUNTER — Other Ambulatory Visit: Payer: Self-pay | Admitting: Neurology

## 2017-02-23 ENCOUNTER — Other Ambulatory Visit: Payer: Self-pay | Admitting: Cardiovascular Disease

## 2017-02-24 DIAGNOSIS — M7062 Trochanteric bursitis, left hip: Secondary | ICD-10-CM | POA: Insufficient documentation

## 2017-02-24 DIAGNOSIS — M7061 Trochanteric bursitis, right hip: Secondary | ICD-10-CM | POA: Insufficient documentation

## 2017-02-24 NOTE — Progress Notes (Signed)
Office Visit Note  Patient: Mia [name]             Date of Birth: [birth year]           MRN: [id number]             PCP: Hali Marry, MD Referring: Hali Marry, * Visit Date: 03/01/2017 Occupation: '@GUAROCC' @    Subjective:   Bilateral ankle, knees, and neck pain  History of Present Illness: Mia [name] is a 66 y.o. female with history of rheumatoid arthritis. According to patient in November 2017 she kept down and felt some discomfort in her left shin area. As the symptoms persisted and she had difficulty walking she went to see her PCP in January 2018. Initially it was felt to be a muscle strain. About was given for 5 weeks. As the symptoms did not improve she was seen by a sports medicine doctor who did MRI of her tibia which showed bone marrow edema. She was given a hard cast for 8 weeks. She went for follow-up visit today and now she's been given a pressure stocking. She will have a repeat MRI of her tibia. She still continues to have some discomfort and swelling in that area. She continues to have some discomfort in her lower back. The MRI showed some degenerative changes and facet joint arthropathy. She had facet joint injection without much results. Her right total knee replacement still causes some discomfort. No other joints are swollen. Patient reports that she ran out of her a well for 1-1/2 weeks and she had a flare with swollen thumb but the symptoms have resolved now. She also gives history of the stiffness in her lower back. She is trying to lose weight.   Activities of Daily Living:  Patient reports morning stiffness for 15 minutes.   Patient Reports nocturnal pain.  Difficulty dressing/grooming: Denies Difficulty climbing stairs: Reports Difficulty getting out of chair: Reports Difficulty using hands for taps, buttons, cutlery, and/or writing: Denies   Review of Systems  Constitutional: Positive for fatigue, weight gain and weakness. Negative for  weight loss.  HENT: Positive for mouth dryness. Negative for mouth sores and nose dryness.   Eyes: Negative for pain, redness and dryness.  Respiratory: Positive for shortness of breath (with exertion). Negative for cough and difficulty breathing.   Cardiovascular: Positive for hypertension and swelling in legs/feet. Negative for chest pain, palpitations and irregular heartbeat.  Gastrointestinal: Positive for constipation and diarrhea. Negative for blood in stool.  Genitourinary: Negative for painful urination.  Musculoskeletal: Positive for arthralgias, joint pain, joint swelling, myalgias, morning stiffness and myalgias.  Skin: Positive for color change. Negative for rash, nodules/bumps, skin tightness and ulcers.  Hematological: Negative for swollen glands.  Psychiatric/Behavioral: Negative for depressed mood and suicidal ideas. The patient is not nervous/anxious.     PMFS History:  Patient Active Problem List   Diagnosis Date Noted  . Trochanteric bursitis of both hips 02/24/2017  . Stress fracture of left tibia 11/05/2016  . Inflammatory arthritis 09/30/2016  . High risk medication use 09/30/2016  . History of Clostridium difficile colitis 09/30/2016  . 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 Pseudogout 09/30/2016  . DDD cervical spine status post fusion 09/30/2016  . DDD thoracic spine 09/30/2016  . H/O total knee replacement, right 09/30/2016  . Age-related osteoporosis without current pathological fracture 09/30/2016  . Tremor, essential 05/14/2016  . Right foot pain 04/14/2016  . B12 deficiency 09/10/2015  . Syncope 09/10/2015  .  Cervical facet joint syndrome ([place]) 09/10/2015  . Chronic fatigue 09/10/2015  . Aortic atherosclerosis ([place]) 04/01/2015  . Primary osteoarthritis of left knee 02/27/2015  . Chronic Dyspnea   . DOE (dyspnea on exertion)   . Benign head tremor 06/11/2014  . Lumbar degenerative disc disease 06/11/2014  . IFG (impaired fasting glucose)  03/09/2014  . Osteopenia 03/09/2014  . IC (interstitial cystitis) 03/09/2014  . Hemorrhoid 03/09/2014  . Retinal wrinkling, right eye 03/09/2014  . Fibromyalgia 03/09/2014  . GERD (gastroesophageal reflux disease) 03/09/2014  . History of arthroplasty of right knee 01/31/2014  . Memory difficulty 12/14/2013  . Hemorrhoids, external, thrombosed 06/22/2011  . Hypertension 03/11/2011  . SVT (supraventricular tachycardia) ([place])   . Raynaud phenomenon   . EDEMA 08/25/2010  . Nonspecific (abnormal) findings on radiological and other examination of body structure 08/25/2010  . COMPUTERIZED TOMOGRAPHY, CHEST, ABNORMAL 08/25/2010  . OSA (obstructive sleep apnea) 04/24/2009  . 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 Allergic rhinitis 06/11/2008  . Dyspnea 06/11/2008  . 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 Chronic diastolic CHF (congestive heart failure) ([place]) 06/08/2008  . INTERSTITIAL CYSTITIS 06/08/2008    Past Medical History:  Diagnosis Date  . Anal fissure   . Anemia   . Arthritis   . Blood transfusion   . C. difficile colitis   . Chest pain    a. 01/2013 MV: EF 59%, no ischemia.  . Chronic Dyspnea    a. 01/2013 Echo: EF 60-65%, Gr 1 DD, PASP 65mHg.  . Diabetes mellitus   . Fibromyalgia   . Gall stones   . GERD (gastroesophageal reflux disease)    gastritis  . Hemorrhoids   . Hypertension   . Interstitial cystitis   . Memory difficulty 12/14/2013  . Neuromuscular disorder ([place]    sclerosis  . Osteoporosis   . Peptic ulcer   . PONV (postoperative nausea and vomiting)   . Pseudogout   . Raynaud phenomenon   . Rectal bleeding   . Sleep apnea    a. on cpap.  .[place] KitchenSVT (supraventricular tachycardia) ([place]   . Syncope 09/10/2015  . Thyroid disease    hypothyroidism  . Tremor, essential 05/14/2016    Family History  Problem Relation Age of Onset  . Heart attack Mother   . Stroke Mother   . Diabetes Mother   . Heart disease Mother   . Colon polyps  Mother   . Asthma Mother   . Breast cancer Maternal Grandmother   . Wilson's disease Maternal Grandmother   . Pancreatic cancer Maternal Grandfather   . Heart disease Father   . Heart attack Father   . Asthma Father   . Stroke Sister   . Hypertension Sister   . Rheum arthritis Sister   . Dementia Sister   . Asthma Sister   . Lupus Sister   . Asthma Sister    Past Surgical History:  Procedure Laterality Date  . APPENDECTOMY    . BLADDER SURGERY     x2  . BLADDER SURGERY    . BREAST BIOPSY    . CARDIAC CATHETERIZATION    . CATARACT EXTRACTION Bilateral   . CHOLECYSTECTOMY    . DILATION AND CURETTAGE OF UTERUS    . ENTEROCELE REPAIR     x2  . EYE SURGERY     retina  . hysterectomy - unknown type    . KNEE SURGERY     x6  . NECK SURGERY     fusion  .  OOPHORECTOMY    . QUADRICEPS REPAIR Right   . RECTOCELE REPAIR     x2  . SHOULDER ARTHROSCOPY WITH SUBACROMIAL DECOMPRESSION, ROTATOR CUFF REPAIR AND BICEP TENDON REPAIR  10/06/2012   Procedure: SHOULDER ARTHROSCOPY WITH SUBACROMIAL DECOMPRESSION, ROTATOR CUFF REPAIR AND BICEP TENDON REPAIR;  Surgeon: Nita Sells, MD;  Location: [facility];  Service: Orthopedics;  Laterality: Right;  Arthroscopic  Repair  of  Subscapularis, Open Biceps Tenodesis  . SHOULDER SURGERY     bilateral- bones spur  . TONSILLECTOMY    . TOTAL KNEE ARTHROPLASTY     Social History   Social History Narrative   Patient drinks about 3-4 cups of caffeine daily.   Patient is right handed.     Objective: Vital Signs: BP 117/79 (BP Location: Left Arm, Patient Position: Sitting, Cuff Size: Normal)   Pulse 72   Resp 14   Ht '5\' 6"'  (1.676 m)   Wt 188 lb (85.3 kg)   BMI 30.34 kg/m    Physical Exam  Constitutional: She is oriented to person, place, and time. She appears well-developed and well-nourished.  HENT:  Head: Normocephalic and atraumatic.  Eyes: Conjunctivae and EOM are normal.  Neck: Normal range of motion.    Cardiovascular: Normal rate, regular rhythm, normal heart sounds and intact distal pulses.   Pulmonary/Chest: Effort normal and breath sounds normal.  Abdominal: Soft. Bowel sounds are normal.  Lymphadenopathy:    She has no cervical adenopathy.  Neurological: She is alert and oriented to person, place, and time.  Skin: Skin is warm and dry. Capillary refill takes less than 2 seconds.  Psychiatric: She has a normal mood and affect. Her behavior is normal.  Nursing note and vitals reviewed.    Musculoskeletal Exam: C-spine good range of motion she has discomfort range of motion in her lumbar region. Shoulder joints although joints wrist joints are good range of motion with no synovitis. She is some PIP/DIP thickening bilaterally with no inflammation. Hip joints are good range of motion. Her right total knee replacement is warm to touch and has limited extension. MTPs PIPs were good range of motion with no synovitis.  CDAI Exam: CDAI Homunculus Exam:   Tenderness:  RLE: tibiofemoral  Joint Counts:  CDAI Tender Joint count: 1 CDAI Swollen Joint count: 0  Global Assessments:  Patient Global Assessment: 5 Provider Global Assessment: 2  CDAI Calculated Score: 8    Investigation: No additional findings. Office Visit on 01/04/2017  Component Date Value Ref Range Status  . Hemoglobin A1C 01/04/2017 5.7   Final  Orders Only on 12/03/2016  Component Date Value Ref Range Status  . WBC 12/03/2016 7.0  3.8 - 10.8 K/uL Final  . RBC 12/03/2016 4.45  3.80 - 5.10 MIL/uL Final  . Hemoglobin 12/03/2016 13.3  11.7 - 15.5 g/dL Final  . HCT 12/03/2016 41.2  35.0 - 45.0 % Final  . MCV 12/03/2016 92.6  80.0 - 100.0 fL Final  . MCH 12/03/2016 29.9  27.0 - 33.0 pg Final  . MCHC 12/03/2016 32.3  32.0 - 36.0 g/dL Final  . RDW 12/03/2016 14.0  11.0 - 15.0 % Final  . Platelets 12/03/2016 362  140 - 400 K/uL Final  . MPV 12/03/2016 10.1  7.5 - 12.5 fL Final  . Neutro Abs 12/03/2016 4270  1,500 -  7,800 cells/uL Final  . Lymphs Abs 12/03/2016 1750  850 - 3,900 cells/uL Final  . Monocytes Absolute 12/03/2016 770  200 - 950 cells/uL Final  .  Eosinophils Absolute 12/03/2016 140  15 - 500 cells/uL Final  . Basophils Absolute 12/03/2016 70  0 - 200 cells/uL Final  . Neutrophils Relative % 12/03/2016 61  % Final  . Lymphocytes Relative 12/03/2016 25  % Final  . Monocytes Relative 12/03/2016 11  % Final  . Eosinophils Relative 12/03/2016 2  % Final  . Basophils Relative 12/03/2016 1  % Final  . Smear Review 12/03/2016 Criteria for review not met   Final  . Sodium 12/03/2016 138  135 - 146 mmol/L Final  . Potassium 12/03/2016 4.2  3.5 - 5.3 mmol/L Final  . Chloride 12/03/2016 101  98 - 110 mmol/L Final  . CO2 12/03/2016 29  20 - 31 mmol/L Final  . Glucose, Bld 12/03/2016 87  65 - 99 mg/dL Final  . BUN 12/03/2016 12  7 - 25 mg/dL Final  . Creat 12/03/2016 0.79  0.50 - 0.99 mg/dL Final   Comment:   For patients > or = 66 years of age: The upper reference limit for Creatinine is approximately 13% higher for people identified as African-American.     . Total Bilirubin 12/03/2016 0.4  0.2 - 1.2 mg/dL Final  . Alkaline Phosphatase 12/03/2016 79  33 - 130 U/L Final  . AST 12/03/2016 19  10 - 35 U/L Final  . ALT 12/03/2016 18  6 - 29 U/L Final  . Total Protein 12/03/2016 6.9  6.1 - 8.1 g/dL Final  . Albumin 12/03/2016 4.2  3.6 - 5.1 g/dL Final  . Calcium 12/03/2016 9.6  8.6 - 10.4 mg/dL Final  . GFR, Est African American 12/03/2016 >89  >=60 mL/min Final  . GFR, Est Non African American 12/03/2016 79  >=60 mL/min Final      Imaging: US Venous Img Lower Unilateral Left  Result Date: 02/15/2017 CLINICAL DATA:  Right calf pain swelling. EXAM: RIGHT LOWER EXTREMITY VENOUS DOPPLER ULTRASOUND TECHNIQUE: Gray-scale sonography with graded compression, as well as color Doppler and duplex ultrasound were performed to evaluate the lower extremity deep venous systems from the level of the common  femoral vein and including the common femoral, femoral, profunda femoral, popliteal and calf veins including the posterior tibial, peroneal and gastrocnemius veins when visible. The superficial great saphenous vein was also interrogated. Spectral Doppler was utilized to evaluate flow at rest and with distal augmentation maneuvers in the common femoral, femoral and popliteal veins. COMPARISON:  10/27/2016 . FINDINGS: Contralateral Common Femoral Vein: Respiratory phasicity is normal and symmetric with the symptomatic side. No evidence of thrombus. Normal compressibility. Common Femoral Vein: No evidence of thrombus. Normal compressibility, respiratory phasicity and response to augmentation. Saphenofemoral Junction: No evidence of thrombus. Normal compressibility and flow on color Doppler imaging. Profunda Femoral Vein: No evidence of thrombus. Normal compressibility and flow on color Doppler imaging. Femoral Vein: No evidence of thrombus. Normal compressibility, respiratory phasicity and response to augmentation. Popliteal Vein: No evidence of thrombus. Normal compressibility, respiratory phasicity and response to augmentation. Calf Veins: No evidence of thrombus. Normal compressibility and flow on color Doppler imaging. Superficial Great Saphenous Vein: No evidence of thrombus. Normal compressibility and flow on color Doppler imaging. Other Findings:  None. IMPRESSION: No evidence of deep venous thrombosis. Electronically Signed   By: Marcello Moores  Register   On: 02/15/2017 12:01    Speciality Comments: No specialty comments available.    Procedures:  No procedures performed Allergies: Myrbetriq  [mirabegron]; Percocet [oxycodone-acetaminophen]; Celebrex [celecoxib]; Codeine; Darvocet [propoxyphene n-acetaminophen]; Erythromycin; Hydrocodone; Lyrica [pregabalin]; Macrodantin [nitrofurantoin]; Toradol [ketorolac tromethamine];  Tramadol; and Verapamil   Assessment / Plan:     Visit Diagnoses: Seronegative  rheumatoid arthritis: She has no active synovitis on examination she appears to be doing well on [place].  High risk medication use - Arava 20 mg by mouth daily - Plan: CBC with Differential/Platelet, COMPLETE METABOLIC PANEL WITH GFR today and every 3 months.  She's been having some discomfort in the left lower extremity since her strain. She's been followed up by PCP and sports medicine doctor. She has some pitting edema. I've advised her to use a pressure stocking.  Raynaud's phenomenon without gangrene: Not very active today  DDD cervical spine status post fusion she has some limitation with no discomfort  DDD thoracic spine: Chronic pain  Lumbar degenerative disc disease: She's been having increased lower back pain. Weight loss will be helpful. I've also given her a handout on back exercises.  Primary osteoarthritis of left knee  H/O total knee replacement, right: Doing well with limited extension  Pseudogout: She has not had a flare  Osteoporosis - Fosamax 70 mg by mouth every week. Followed up by PCP.  IC (interstitial cystitis)  Chronic fatigue  History of Clostridium difficile colitis  Memory difficulty  Fibromyalgia: She continues to have some generalized pain from fibromyalgia  Chronic Dyspnea  History of CHF (congestive heart failure)  History of hypertension  PRIMARY LATERAL SCLEROSIS  History of gastroesophageal reflux (GERD)    Orders: No orders of the defined types were placed in this encounter.  No orders of the defined types were placed in this encounter.   Face-to-face time spent with patient was 30 minutes. 50% of time was spent in counseling and coordination of care.  Follow-Up Instructions: Return for Rheumatoid arthritis, Osteoarthritis.   Bo Merino, MD  Note - This record has been created using Editor, commissioning.  Chart creation errors have been sought, but may not always  have been located. Such creation errors do not reflect on    the standard of medical care.

## 2017-02-25 MED ORDER — ATORVASTATIN 40 MG TAB
40 mg | ORAL_TABLET | ORAL | 3 refills | Status: DC
Start: 2017-02-25 — End: 2018-06-09

## 2017-03-01 ENCOUNTER — Ambulatory Visit (INDEPENDENT_AMBULATORY_CARE_PROVIDER_SITE_OTHER): Payer: Medicare Other | Admitting: Rheumatology

## 2017-03-01 ENCOUNTER — Encounter: Payer: Self-pay | Admitting: Rheumatology

## 2017-03-01 ENCOUNTER — Other Ambulatory Visit: Payer: Self-pay | Admitting: *Deleted

## 2017-03-01 VITALS — BP 117/79 | HR 72 | Resp 14 | Ht 66.0 in | Wt 188.0 lb

## 2017-03-01 DIAGNOSIS — M47814 Spondylosis without myelopathy or radiculopathy, thoracic region: Secondary | ICD-10-CM | POA: Diagnosis not present

## 2017-03-01 DIAGNOSIS — M797 Fibromyalgia: Secondary | ICD-10-CM

## 2017-03-01 DIAGNOSIS — M0609 Rheumatoid arthritis without rheumatoid factor, multiple sites: Secondary | ICD-10-CM

## 2017-03-01 DIAGNOSIS — Z96651 Presence of right artificial knee joint: Secondary | ICD-10-CM | POA: Diagnosis not present

## 2017-03-01 DIAGNOSIS — I7300 Raynaud's syndrome without gangrene: Secondary | ICD-10-CM

## 2017-03-01 DIAGNOSIS — M1712 Unilateral primary osteoarthritis, left knee: Secondary | ICD-10-CM

## 2017-03-01 DIAGNOSIS — M810 Age-related osteoporosis without current pathological fracture: Secondary | ICD-10-CM | POA: Diagnosis not present

## 2017-03-01 DIAGNOSIS — M5136 Other intervertebral disc degeneration, lumbar region: Secondary | ICD-10-CM | POA: Diagnosis not present

## 2017-03-01 DIAGNOSIS — M51369 Other intervertebral disc degeneration, lumbar region without mention of lumbar back pain or lower extremity pain: Secondary | ICD-10-CM

## 2017-03-01 DIAGNOSIS — M1120 Other chondrocalcinosis, unspecified site: Secondary | ICD-10-CM

## 2017-03-01 DIAGNOSIS — N3010 Interstitial cystitis (chronic) without hematuria: Secondary | ICD-10-CM

## 2017-03-01 DIAGNOSIS — Z79899 Other long term (current) drug therapy: Secondary | ICD-10-CM

## 2017-03-01 DIAGNOSIS — Z8679 Personal history of other diseases of the circulatory system: Secondary | ICD-10-CM

## 2017-03-01 DIAGNOSIS — Z8619 Personal history of other infectious and parasitic diseases: Secondary | ICD-10-CM

## 2017-03-01 DIAGNOSIS — R413 Other amnesia: Secondary | ICD-10-CM

## 2017-03-01 DIAGNOSIS — R5382 Chronic fatigue, unspecified: Secondary | ICD-10-CM | POA: Diagnosis not present

## 2017-03-01 DIAGNOSIS — M5030 Other cervical disc degeneration, unspecified cervical region: Secondary | ICD-10-CM | POA: Diagnosis not present

## 2017-03-01 DIAGNOSIS — Z8719 Personal history of other diseases of the digestive system: Secondary | ICD-10-CM

## 2017-03-01 DIAGNOSIS — G1223 Primary lateral sclerosis: Secondary | ICD-10-CM

## 2017-03-01 DIAGNOSIS — R0602 Shortness of breath: Secondary | ICD-10-CM

## 2017-03-01 DIAGNOSIS — M47812 Spondylosis without myelopathy or radiculopathy, cervical region: Secondary | ICD-10-CM

## 2017-03-01 LAB — CBC WITH DIFFERENTIAL/PLATELET
Basophils Absolute: 74 cells/uL (ref 0–200)
Basophils Relative: 1 %
Eosinophils Absolute: 148 cells/uL (ref 15–500)
Eosinophils Relative: 2 %
HCT: 41.2 % (ref 35.0–45.0)
Hemoglobin: 13.4 g/dL (ref 11.7–15.5)
Lymphocytes Relative: 29 %
Lymphs Abs: 2146 cells/uL (ref 850–3900)
MCH: 30.3 pg (ref 27.0–33.0)
MCHC: 32.5 g/dL (ref 32.0–36.0)
MCV: 93.2 fL (ref 80.0–100.0)
MPV: 10 fL (ref 7.5–12.5)
Monocytes Absolute: 814 cells/uL (ref 200–950)
Monocytes Relative: 11 %
Neutro Abs: 4218 cells/uL (ref 1500–7800)
Neutrophils Relative %: 57 %
Platelets: 433 10*3/uL — ABNORMAL HIGH (ref 140–400)
RBC: 4.42 MIL/uL (ref 3.80–5.10)
RDW: 14.6 % (ref 11.0–15.0)
WBC: 7.4 10*3/uL (ref 3.8–10.8)

## 2017-03-01 LAB — COMPLETE METABOLIC PANEL WITH GFR
ALT: 23 U/L (ref 6–29)
AST: 25 U/L (ref 10–35)
Albumin: 4.1 g/dL (ref 3.6–5.1)
Alkaline Phosphatase: 85 U/L (ref 33–130)
BUN: 9 mg/dL (ref 7–25)
CO2: 30 mmol/L (ref 20–31)
Calcium: 9.9 mg/dL (ref 8.6–10.4)
Chloride: 102 mmol/L (ref 98–110)
Creat: 0.83 mg/dL (ref 0.50–0.99)
GFR, Est African American: 86 mL/min (ref >=60)
GFR, Est Non African American: 74 mL/min (ref >=60)
Glucose, Bld: 104 mg/dL — ABNORMAL HIGH (ref 65–99)
Potassium: 4.4 mmol/L (ref 3.5–5.3)
Sodium: 140 mmol/L (ref 135–146)
Total Bilirubin: 0.3 mg/dL (ref 0.2–1.2)
Total Protein: 6.6 g/dL (ref 6.1–8.1)

## 2017-03-01 MED ORDER — DONEPEZIL HCL 5 MG PO TABS: 5.0000 mg | ORAL_TABLET | Freq: Every day | ORAL | 3 refills | Status: DC

## 2017-03-01 MED ORDER — VENLAFAXINE HCL ER 75 MG PO CP24: ORAL_CAPSULE | ORAL | 3 refills | Status: DC

## 2017-03-01 NOTE — Progress Notes (Signed)
Rheumatology Medication Review by a Pharmacist Does the patient feel that his/her medications are working for him/her?  Yes Has the patient been experiencing any side effects to the medications prescribed?  No Does the patient have any problems obtaining medications?  No - patient was previously having trouble affording leflunomide (tier 3 under her insurance) a tier exception was submitted and approved.  Patient confirms she received her prescription from the pharmacy and denies any other concerns about medication access.   Issues to address at subsequent visits: None   Pharmacist comments:  Mia [name] is a pleasant 66 yo F who presents for follow up of rheumatoid arthritis.  She is currently taking leflunomide 20 mg daily.  [name] recent standing labs were normal on 12/03/16.  She is due for standing labs again today.  Patient denies any questions or concerns regarding her medications at this time.    Mia [name], Pharm.D., BCPS, CPP Clinical Pharmacist Pager: [phone] Phone: [phone] 03/01/2017 10:04 AM

## 2017-03-01 NOTE — Patient Instructions (Addendum)
Standing Labs We placed an order today for your standing lab work.    Please come back and get your standing labs in August 2018 and every 3 months.  We have open lab Monday through Friday from 8:30-11:30 AM and 1:30-4 PM at the office of Dr. Tresa Moore, PA.   The office is located at [address] [place], [place],  [address] No appointment is necessary.   Labs are drawn by [facility].  You may receive a bill from [facility] for your lab work.     Back Exercises The following exercises strengthen the muscles that help to support the back. They also help to keep the lower back flexible. Doing these exercises can help to prevent back pain or lessen existing pain. If you have back pain or discomfort, try doing these exercises 2-3 times each day or as told by your health care provider. When the pain goes away, do them once each day, but increase the number of times that you repeat the steps for each exercise (do more repetitions). If you do not have back pain or discomfort, do these exercises once each day or as told by your health care provider. Exercises Single Knee to Chest   Repeat these steps 3-5 times for each leg: 1. Lie on your back on a firm bed or the floor with your legs extended. 2. Bring one knee to your chest. Your other leg should stay extended and in contact with the floor. 3. Hold your knee in place by grabbing your knee or thigh. 4. Pull on your knee until you feel a gentle stretch in your lower back. 5. Hold the stretch for 10-30 seconds. 6. Slowly release and straighten your leg. Pelvic Tilt   Repeat these steps 5-10 times: 1. Lie on your back on a firm bed or the floor with your legs extended. 2. Bend your knees so they are pointing toward the ceiling and your feet are flat on the floor. 3. Tighten your lower abdominal muscles to press your lower back against the floor. This motion will tilt your pelvis so your tailbone points up toward the  ceiling instead of pointing to your feet or the floor. 4. With gentle tension and even breathing, hold this position for 5-10 seconds. Cat-Cow   Repeat these steps until your lower back becomes more flexible: 1. Get into a hands-and-knees position on a firm surface. Keep your hands under your shoulders, and keep your knees under your hips. You may place padding under your knees for comfort. 2. Let your head hang down, and point your tailbone toward the floor so your lower back becomes rounded like the back of a cat. 3. Hold this position for 5 seconds. 4. Slowly lift your head and point your tailbone up toward the ceiling so your back forms a sagging arch like the back of a cow. 5. Hold this position for 5 seconds. Press-Ups   Repeat these steps 5-10 times: 1. Lie on your abdomen (face-down) on the floor. 2. Place your palms near your head, about shoulder-width apart. 3. While you keep your back as relaxed as possible and keep your hips on the floor, slowly straighten your arms to raise the top half of your body and lift your shoulders. Do not use your back muscles to raise your upper torso. You may adjust the placement of your hands to make yourself more comfortable. 4. Hold this position for 5 seconds while you keep your back relaxed. 5. Slowly return  to lying flat on the floor. Bridges   Repeat these steps 10 times: 1. Lie on your back on a firm surface. 2. Bend your knees so they are pointing toward the ceiling and your feet are flat on the floor. 3. Tighten your buttocks muscles and lift your buttocks off of the floor until your waist is at almost the same height as your knees. You should feel the muscles working in your buttocks and the back of your thighs. If you do not feel these muscles, slide your feet 1-2 inches farther away from your buttocks. 4. Hold this position for 3-5 seconds. 5. Slowly lower your hips to the starting position, and allow your buttocks muscles to relax  completely. If this exercise is too easy, try doing it with your arms crossed over your chest. Abdominal Crunches   Repeat these steps 5-10 times: 1. Lie on your back on a firm bed or the floor with your legs extended. 2. Bend your knees so they are pointing toward the ceiling and your feet are flat on the floor. 3. Cross your arms over your chest. 4. Tip your chin slightly toward your chest without bending your neck. 5. Tighten your abdominal muscles and slowly raise your trunk (torso) high enough to lift your shoulder blades a tiny bit off of the floor. Avoid raising your torso higher than that, because it can put too much stress on your low back and it does not help to strengthen your abdominal muscles. 6. Slowly return to your starting position. Back Lifts  Repeat these steps 5-10 times: 1. Lie on your abdomen (face-down) with your arms at your sides, and rest your forehead on the floor. 2. Tighten the muscles in your legs and your buttocks. 3. Slowly lift your chest off of the floor while you keep your hips pressed to the floor. Keep the back of your head in line with the curve in your back. Your eyes should be looking at the floor. 4. Hold this position for 3-5 seconds. 5. Slowly return to your starting position. Contact a health care provider if:  Your back pain or discomfort gets much worse when you do an exercise.  Your back pain or discomfort does not lessen within 2 hours after you exercise. If you have any of these problems, stop doing these exercises right away. Do not do them again unless your health care provider says that you can. Get help right away if:  You develop sudden, severe back pain. If this happens, stop doing the exercises right away. Do not do them again unless your health care provider says that you can. This information is not intended to replace advice given to you by your health care provider. Make sure you discuss any questions you have with your health  care provider. Document Released: 11/19/2004 Document Revised: 02/19/2016 Document Reviewed: 12/06/2014 Elsevier Interactive Patient Education  2017 [employer].

## 2017-03-02 NOTE — Progress Notes (Signed)
stable °

## 2017-03-03 ENCOUNTER — Ambulatory Visit (INDEPENDENT_AMBULATORY_CARE_PROVIDER_SITE_OTHER): Payer: Medicare Other | Admitting: Family Medicine

## 2017-03-03 VITALS — BP 139/71 | HR 72

## 2017-03-03 DIAGNOSIS — E538 Deficiency of other specified B group vitamins: Secondary | ICD-10-CM

## 2017-03-03 MED ORDER — CYANOCOBALAMIN 1000 MCG/ML IJ SOLN
1000.0000 ug | Freq: Once | INTRAMUSCULAR | Status: AC
  Administered 2017-03-03: 1000 ug via INTRAMUSCULAR

## 2017-03-03 NOTE — Progress Notes (Signed)
Pernicious anemia. Continue current regimen of monthly B12 injections. Due for repeat B12 on lab work. Recommend that she get this drawn right before her next injection.  [name], MD

## 2017-03-03 NOTE — Progress Notes (Signed)
Pt came into clinic today for monthly B12 injections. Pt reports no complications regarding injections. Pt tolerated injection in LUOQ well, no immediate complications. Pt advised to schedule her next appointment in 4 weeks, verbalized understanding. No further questions.

## 2017-03-04 DIAGNOSIS — R197 Diarrhea, unspecified: Secondary | ICD-10-CM | POA: Diagnosis not present

## 2017-03-04 DIAGNOSIS — D120 Benign neoplasm of cecum: Secondary | ICD-10-CM | POA: Diagnosis not present

## 2017-03-04 LAB — HM COLONOSCOPY

## 2017-03-05 DIAGNOSIS — Z96651 Presence of right artificial knee joint: Secondary | ICD-10-CM | POA: Diagnosis not present

## 2017-03-05 DIAGNOSIS — Z882 Allergy status to sulfonamides status: Secondary | ICD-10-CM | POA: Diagnosis not present

## 2017-03-05 DIAGNOSIS — R1084 Generalized abdominal pain: Secondary | ICD-10-CM | POA: Diagnosis not present

## 2017-03-05 DIAGNOSIS — Z7901 Long term (current) use of anticoagulants: Secondary | ICD-10-CM | POA: Diagnosis not present

## 2017-03-05 DIAGNOSIS — M069 Rheumatoid arthritis, unspecified: Secondary | ICD-10-CM | POA: Diagnosis not present

## 2017-03-05 DIAGNOSIS — I2510 Atherosclerotic heart disease of native coronary artery without angina pectoris: Secondary | ICD-10-CM | POA: Diagnosis not present

## 2017-03-05 DIAGNOSIS — K5730 Diverticulosis of large intestine without perforation or abscess without bleeding: Secondary | ICD-10-CM | POA: Diagnosis not present

## 2017-03-05 DIAGNOSIS — Z9889 Other specified postprocedural states: Secondary | ICD-10-CM | POA: Diagnosis not present

## 2017-03-05 DIAGNOSIS — E119 Type 2 diabetes mellitus without complications: Secondary | ICD-10-CM | POA: Diagnosis not present

## 2017-03-05 DIAGNOSIS — R079 Chest pain, unspecified: Secondary | ICD-10-CM | POA: Diagnosis not present

## 2017-03-05 DIAGNOSIS — R112 Nausea with vomiting, unspecified: Secondary | ICD-10-CM | POA: Diagnosis not present

## 2017-03-05 DIAGNOSIS — Z885 Allergy status to narcotic agent status: Secondary | ICD-10-CM | POA: Diagnosis not present

## 2017-03-05 DIAGNOSIS — R197 Diarrhea, unspecified: Secondary | ICD-10-CM | POA: Diagnosis not present

## 2017-03-05 DIAGNOSIS — N281 Cyst of kidney, acquired: Secondary | ICD-10-CM | POA: Diagnosis not present

## 2017-03-05 DIAGNOSIS — Z7983 Long term (current) use of bisphosphonates: Secondary | ICD-10-CM | POA: Diagnosis not present

## 2017-03-05 DIAGNOSIS — Z9981 Dependence on supplemental oxygen: Secondary | ICD-10-CM | POA: Diagnosis not present

## 2017-03-05 DIAGNOSIS — R51 Headache: Secondary | ICD-10-CM | POA: Diagnosis not present

## 2017-03-05 DIAGNOSIS — Z981 Arthrodesis status: Secondary | ICD-10-CM | POA: Diagnosis not present

## 2017-03-05 DIAGNOSIS — I10 Essential (primary) hypertension: Secondary | ICD-10-CM | POA: Diagnosis not present

## 2017-03-05 DIAGNOSIS — Z7984 Long term (current) use of oral hypoglycemic drugs: Secondary | ICD-10-CM | POA: Diagnosis not present

## 2017-03-05 DIAGNOSIS — R9341 Abnormal radiologic findings on diagnostic imaging of renal pelvis, ureter, or bladder: Secondary | ICD-10-CM | POA: Diagnosis not present

## 2017-03-05 DIAGNOSIS — Z888 Allergy status to other drugs, medicaments and biological substances status: Secondary | ICD-10-CM | POA: Diagnosis not present

## 2017-03-17 DIAGNOSIS — R3982 Chronic bladder pain: Secondary | ICD-10-CM | POA: Diagnosis not present

## 2017-03-18 DIAGNOSIS — N3941 Urge incontinence: Secondary | ICD-10-CM | POA: Diagnosis not present

## 2017-03-29 ENCOUNTER — Encounter: Payer: Self-pay | Admitting: Family Medicine

## 2017-03-29 ENCOUNTER — Ambulatory Visit (INDEPENDENT_AMBULATORY_CARE_PROVIDER_SITE_OTHER): Payer: Medicare Other | Admitting: Family Medicine

## 2017-03-29 ENCOUNTER — Telehealth: Payer: Self-pay | Admitting: Cardiovascular Disease

## 2017-03-29 VITALS — BP 143/76 | HR 73 | Ht 65.35 in | Wt 179.0 lb

## 2017-03-29 DIAGNOSIS — R6884 Jaw pain: Secondary | ICD-10-CM

## 2017-03-29 DIAGNOSIS — R5383 Other fatigue: Secondary | ICD-10-CM | POA: Diagnosis not present

## 2017-03-29 DIAGNOSIS — I10 Essential (primary) hypertension: Secondary | ICD-10-CM | POA: Diagnosis not present

## 2017-03-29 DIAGNOSIS — E538 Deficiency of other specified B group vitamins: Secondary | ICD-10-CM | POA: Diagnosis not present

## 2017-03-29 NOTE — Telephone Encounter (Signed)
Mia [name] is calling because she has been extremely fatigue and blood pressure has been running high. The bottom umber has been real high . Wants to know can that make her fatigue. Please call   thanks

## 2017-03-29 NOTE — Telephone Encounter (Signed)
Spoke with patient who states she went on a church retreat last week and was so fatigued she had to lay down during the day.  She states she was very excited about the trip and would not normally want to miss out on the activities so this is unusual for her. She states she also has not had much of an appetite. She asks if her elevated BP could cause her to feel this way. She reports diastolic readings of 01'U to 100's mmHg. Recent BP readings have been 148/95 mmHg, 165/91 mmHg, and today 149/94 mmHg.  She states she has used 3 different monitors for accuracy. Reports pulse has been 80's to 100's bpm. She reports compliance with her medications except she has not taken gabapentin for 1 week due to running out. She does not have history of CHF and denies recent weight gain. I advised her to call her PCP for evaluation of her fatigue as her BP and HR readings do not likely reflect cause for this severe fatigue. I advised her to call back if she needs sooner follow-up with Dr. Acie Fredrickson.  She verbalized understanding and agreement with plan and thanked me for the call.

## 2017-03-29 NOTE — Progress Notes (Signed)
CC: Extreme fatigue  Subjective:    Patient ID: Mia [name], female    DOB: [birth year], 66 y.o.   MRN: [id number]  HPI 66 year old female with a history of diastolic heart failure and SVT comes in today complaining of feeling  she has been more fatigued and BPs have been running higher than usually. Brought in home BP logs.  Hasn't been eating as much.  She does drink coffee. Has been excessively fatigued. Even sleeping duing the daytime.has noticed some inc SOB with activity.    Stopped her Lisbeth Ply over the weekend thinking the med was causing her sxs.  She just had CMP and CBC a months ago that was fairly normal. + nausea.  She reports she has been taking her Protonix regularly.This morning she actually woke up with jaw pain on both sides. She did not experience any chest pain or diaphoresis at the time. She did try taking some times and that did help. She reports that she takes her Protonix daily.  B12 deficiency - due to recheck her level.    Review of Systems  No blood in the urine or stools.  She does have frequent sweats and hot flashes.   No CP.   BP (!) 143/76   Pulse 73   Ht 5' 5.35" (1.66 m)   Wt 179 lb (81.2 kg)   SpO2 98%   BMI 29.47 kg/m     Allergies  Allergen Reactions  . Myrbetriq  [Mirabegron] Other (See Comments)    SEVERE HEADACHE  . Percocet [Oxycodone-Acetaminophen] Nausea And Vomiting  . Celebrex [Celecoxib] Other (See Comments)    Other reaction(s): Other flushed  . Codeine Nausea And Vomiting  . Darvocet [Propoxyphene N-Acetaminophen] Nausea And Vomiting  . Erythromycin Nausea And Vomiting    Nausea and vomiting   . Hydrocodone Nausea And Vomiting  . Lyrica [Pregabalin] Swelling    Legs swelling   . Macrodantin [Nitrofurantoin] Nausea And Vomiting  . Toradol [Ketorolac Tromethamine] Nausea And Vomiting    Per patient, only PO form causes nausea and vomiting. Can take injection without issue  . Tramadol Nausea And Vomiting  . Verapamil Nausea And  Vomiting    REACTION: intolerance    Past Medical History:  Diagnosis Date  . Anal fissure   . Anemia   . Arthritis   . Blood transfusion   . C. difficile colitis   . Chest pain    a. 01/2013 MV: EF 59%, no ischemia.  . Chronic Dyspnea    a. 01/2013 Echo: EF 60-65%, Gr 1 DD, PASP 34mmHg.  . Diabetes mellitus   . Fibromyalgia   . Gall stones   . GERD (gastroesophageal reflux disease)    gastritis  . Hemorrhoids   . Hypertension   . Interstitial cystitis   . Memory difficulty 12/14/2013  . Neuromuscular disorder ([place])    sclerosis  . Osteoporosis   . Peptic ulcer   . PONV (postoperative nausea and vomiting)   . Pseudogout   . Raynaud phenomenon   . Rectal bleeding   . Sleep apnea    a. on cpap.  Marland Kitchen SVT (supraventricular tachycardia) ([place])   . Syncope 09/10/2015  . Thyroid disease    hypothyroidism  . Tremor, essential 05/14/2016    Past Surgical History:  Procedure Laterality Date  . APPENDECTOMY    . BLADDER SURGERY     x2  . BLADDER SURGERY    . BREAST BIOPSY    . CARDIAC CATHETERIZATION    .  CATARACT EXTRACTION Bilateral   . CHOLECYSTECTOMY    . DILATION AND CURETTAGE OF UTERUS    . ENTEROCELE REPAIR     x2  . EYE SURGERY     retina  . hysterectomy - unknown type    . KNEE SURGERY     x6  . NECK SURGERY     fusion  . OOPHORECTOMY    . QUADRICEPS REPAIR Right   . RECTOCELE REPAIR     x2  . SHOULDER ARTHROSCOPY WITH SUBACROMIAL DECOMPRESSION, ROTATOR CUFF REPAIR AND BICEP TENDON REPAIR  10/06/2012   Procedure: SHOULDER ARTHROSCOPY WITH SUBACROMIAL DECOMPRESSION, ROTATOR CUFF REPAIR AND BICEP TENDON REPAIR;  Surgeon: Nita Sells, MD;  Location: [facility];  Service: Orthopedics;  Laterality: Right;  Arthroscopic  Repair  of  Subscapularis, Open Biceps Tenodesis  . SHOULDER SURGERY     bilateral- bones spur  . TONSILLECTOMY    . TOTAL KNEE ARTHROPLASTY      Social History   Social History  . Marital status: Married     Spouse name: N/A  . Number of children: 2  . Years of education: 14   Occupational History  . Retired    Social History Main Topics  . Smoking status: Never Smoker  . Smokeless tobacco: Never Used  . Alcohol use No  . Drug use: No  . Sexual activity: Not on file   Other Topics Concern  . Not on file   Social History Narrative   Patient drinks about 3-4 cups of caffeine daily.   Patient is right handed.    Family History  Problem Relation Age of Onset  . Heart attack Mother   . Stroke Mother   . Diabetes Mother   . Heart disease Mother   . Colon polyps Mother   . Asthma Mother   . Breast cancer Maternal Grandmother   . Wilson's disease Maternal Grandmother   . Pancreatic cancer Maternal Grandfather   . Heart disease Father   . Heart attack Father   . Asthma Father   . Stroke Sister   . Hypertension Sister   . Rheum arthritis Sister   . Dementia Sister   . Asthma Sister   . Lupus Sister   . Asthma Sister     Outpatient Encounter Prescriptions as of 03/29/2017  Medication Sig  . alendronate (FOSAMAX) 70 MG tablet Take 1 tablet by mouth  every 7 days with a full  glass of water on an empty  stomach  . AMBULATORY NON FORMULARY MEDICATION Medication Name: One touch ultra strips. Check fasting blood sugar in the morning and as needed.  Dx - B2841 Fax to [phone]  . atorvastatin (LIPITOR) 40 MG tablet Take 1 tablet (40 mg total) by mouth daily.  . calcium carbonate (OS-CAL) 600 MG TABS Take 600 mg by mouth daily with breakfast.   . cetirizine (ZYRTEC) 10 MG tablet Take 10 mg by mouth as needed for allergies.  . Cyanocobalamin (VITAMIN B-12 IJ) Inject 1,000 mg as directed every 28 (twenty-eight) days.  . cyclobenzaprine (FLEXERIL) 10 MG tablet One half tab PO qHS, then increase gradually to one tab TID. (Patient taking differently: as needed. One half tab PO qHS, then increase gradually to one tab TID.)  . donepezil (ARICEPT) 5 MG tablet Take 1 tablet (5 mg total) by  mouth at bedtime.  . furosemide (LASIX) 20 MG tablet Take 1 tablet (20 mg total) by mouth daily.  Marland Kitchen gabapentin (NEURONTIN) 300 MG capsule  TAKE 1 TO 2 CAPSULES BY MOUTH UP TO TWO TIMES DAILY AS NEEDED  . ipratropium (ATROVENT) 0.03 % nasal spray Place 2 sprays into both nostrils every 12 (twelve) hours as needed for rhinitis.  Marland Kitchen leflunomide (ARAVA) 20 MG tablet Take 1 tablet (20 mg total) by mouth daily.  . memantine (NAMENDA) 10 MG tablet Take 1 tablet (10 mg total) by mouth 2 (two) times daily.  . metFORMIN (GLUCOPHAGE-XR) 500 MG 24 hr tablet Take 1 tablet (500 mg total) by mouth daily with breakfast.  . NON FORMULARY Place into the nose at bedtime. cpap machine  . ondansetron (ZOFRAN-ODT) 4 MG disintegrating tablet Take 4 mg by mouth every 6 (six) hours as needed for nausea or vomiting.  . ONE TOUCH ULTRA TEST test strip USE TO TEST BLOOD SUGAR ONCE DAILY  . pantoprazole (PROTONIX) 40 MG tablet TAKE 1 TABLET BY MOUTH TWO  TIMES DAILY  . primidone (MYSOLINE) 50 MG tablet TAKE 1 TABLET (50 MG TOTAL) BY MOUTH AT BEDTIME.  Marland Kitchen propranolol (INDERAL) 10 MG tablet Take 1 tablet (10 mg total) by mouth 4 (four) times daily as needed.  . SUMAtriptan (IMITREX) 50 MG tablet Take 1 tablet (50 mg total) by mouth every 2 (two) hours as needed.  . venlafaxine XR (EFFEXOR-XR) 75 MG 24 hr capsule TAKE 1 CAPSULE BY MOUTH  DAILY WITH BREAKFAST  . vitamin D, CHOLECALCIFEROL, 400 UNITS tablet Take 400 Units by mouth daily.   . fesoterodine (TOVIAZ) 8 MG TB24 tablet Take 8 mg by mouth daily.  . metoprolol (LOPRESSOR) 50 MG tablet Take 1.5 tablets (75 mg total) by mouth 2 (two) times daily.  . [DISCONTINUED] amoxicillin (AMOXIL) 500 MG capsule TAKE 4 CAPSULES BY MOUTH 1 HOUR PRIOR TO DENTAL PROCEDURE/APPT.  . [DISCONTINUED] vitamin C (ASCORBIC ACID) 500 MG tablet Take 500 mg by mouth daily.   No facility-administered encounter medications on file as of 03/29/2017.          Objective:   Physical Exam   Constitutional: She is oriented to person, place, and time. She appears well-developed and well-nourished.  HENT:  Head: Normocephalic and atraumatic.  Right Ear: External ear normal.  Left Ear: External ear normal.  Nose: Nose normal.  Mouth/Throat: Oropharynx is clear and moist.  TMs and canals are clear.   Eyes: Conjunctivae and EOM are normal. Pupils are equal, round, and reactive to light.  Neck: Neck supple. No thyromegaly present.  Cardiovascular: Normal rate, regular rhythm and normal heart sounds.   Pulmonary/Chest: Effort normal and breath sounds normal. She has no wheezes.  Musculoskeletal: She exhibits no edema.  Lymphadenopathy:    She has no cervical adenopathy.  Neurological: She is alert and oriented to person, place, and time.  Skin: Skin is warm and dry.  Psychiatric: She has a normal mood and affect. Her behavior is normal.        Assessment & Plan:  Excess fatigue - Okay to continue to hold Toviaz for the rest of the week just to see if she starts to feel better. Also consider could be because her blood pressure has been more elevated recently.  B12 deficiency - due to recheck her level.   Has been getting injections.    Jaw Pain - Unclear etiology. Not sure if reflux related. EKG shows rate of 72 bpm, normal sinus rhythm with no acute ST-T wave changes.  HTN - BPs have been elevated based on the log that she brought in. About half of BPs were  up.  Aricept can cause BP elevation but this is not new.  Could increase metoprolol to 100mg  BID.

## 2017-03-29 NOTE — Telephone Encounter (Signed)
I agree that this should be evaluated by her primary MI first .  Agree with note from [facility]

## 2017-03-31 ENCOUNTER — Telehealth: Payer: Self-pay | Admitting: Family Medicine

## 2017-03-31 ENCOUNTER — Ambulatory Visit: Payer: Medicare Other

## 2017-03-31 DIAGNOSIS — R5383 Other fatigue: Secondary | ICD-10-CM | POA: Diagnosis not present

## 2017-03-31 DIAGNOSIS — D519 Vitamin B12 deficiency anemia, unspecified: Secondary | ICD-10-CM | POA: Diagnosis not present

## 2017-03-31 LAB — VITAMIN B12: Vitamin B-12: 441 pg/mL (ref 200–1100)

## 2017-03-31 LAB — TSH: TSH: 1.64 mIU/L

## 2017-03-31 LAB — SEDIMENTATION RATE: Sed Rate: 14 mm/hr (ref 0–30)

## 2017-03-31 NOTE — Telephone Encounter (Signed)
Call patient: Please see how she is feeling and if she she is starting to get a little more energy.

## 2017-04-01 NOTE — Telephone Encounter (Signed)
I couldn't find anything particular as far as interaction. It can lower blood sugar so be aware of this.  I didn't find in [place] literature it recommends not using for more than 2 months.

## 2017-04-01 NOTE — Telephone Encounter (Signed)
Patient is feeling better and wants to know if she can take Ginsing

## 2017-04-02 ENCOUNTER — Encounter: Payer: Self-pay | Admitting: Family Medicine

## 2017-04-02 ENCOUNTER — Ambulatory Visit (INDEPENDENT_AMBULATORY_CARE_PROVIDER_SITE_OTHER): Payer: Medicare Other | Admitting: Family Medicine

## 2017-04-02 VITALS — BP 118/60 | HR 67

## 2017-04-02 DIAGNOSIS — E538 Deficiency of other specified B group vitamins: Secondary | ICD-10-CM

## 2017-04-02 MED ORDER — CYANOCOBALAMIN 1000 MCG/ML IJ SOLN
1000.0000 ug | Freq: Once | INTRAMUSCULAR | Status: AC
  Administered 2017-04-02: 1000 ug via INTRAMUSCULAR

## 2017-04-02 NOTE — Progress Notes (Signed)
Pt came into clinic today for monthly B12 injections. Pt reports no complications regarding injections, does state she has has some increased fatigue over the past week. Pt tolerated injection in LUOQ well, no immediate complications. Pt advised to schedule her next appointment in 4 weeks, verbalized understanding. No further questions at this time.

## 2017-04-02 NOTE — Telephone Encounter (Signed)
Patient notified

## 2017-04-07 ENCOUNTER — Encounter: Payer: Self-pay | Admitting: Family Medicine

## 2017-04-07 DIAGNOSIS — M810 Age-related osteoporosis without current pathological fracture: Secondary | ICD-10-CM | POA: Diagnosis not present

## 2017-04-07 DIAGNOSIS — M8589 Other specified disorders of bone density and structure, multiple sites: Secondary | ICD-10-CM | POA: Diagnosis not present

## 2017-04-07 DIAGNOSIS — Z1231 Encounter for screening mammogram for malignant neoplasm of breast: Secondary | ICD-10-CM | POA: Diagnosis not present

## 2017-04-07 LAB — HM MAMMOGRAPHY

## 2017-04-14 ENCOUNTER — Encounter: Payer: Self-pay | Admitting: Podiatry

## 2017-04-14 ENCOUNTER — Ambulatory Visit (INDEPENDENT_AMBULATORY_CARE_PROVIDER_SITE_OTHER): Payer: Medicare Other | Admitting: Podiatry

## 2017-04-14 VITALS — BP 142/74 | HR 64 | Resp 18

## 2017-04-14 DIAGNOSIS — B351 Tinea unguium: Secondary | ICD-10-CM | POA: Diagnosis not present

## 2017-04-14 DIAGNOSIS — M79676 Pain in unspecified toe(s): Secondary | ICD-10-CM | POA: Diagnosis not present

## 2017-04-14 NOTE — Patient Instructions (Signed)

## 2017-04-14 NOTE — Progress Notes (Signed)
Patient ID: Mia [name], female   DOB: Dec 14, 1950, 66 y.o.   MRN: [id number]   Subjective: This diabetic patient presents today complaining that she's unable to trim her toenails which or cough walking wearing shoes and requests toenail debridement. Patient denies history of skin ulceration, claudication or amputation Denies history of burning, numbness, Patient is nonsmoker History of phenol matricectomy with healing of right hallux  Objective: No edema or calf tenderness bilaterally DP 0/4 right and 1/4 left Capillary reflex immediate bilaterally No open skin lesions bilaterally Well-healed matricectomy medial margin right hallux Absent fourth left toenail. Absent hair growth bilaterally Toenails 9 are elongated, incurvated, discolored with deformity and palpable tenderness 9 Manual motor testing dorsi flexion, plantar flexion 5/5 bilaterally  Assessment: Diabetic with adequate neurovascular status Mycotic toenails 9  Plan: Debridement toenails 9 mechanically and electrically without any bleeding  Reappoint 3 months

## 2017-04-15 DIAGNOSIS — N3941 Urge incontinence: Secondary | ICD-10-CM | POA: Diagnosis not present

## 2017-04-15 DIAGNOSIS — R350 Frequency of micturition: Secondary | ICD-10-CM | POA: Diagnosis not present

## 2017-04-16 ENCOUNTER — Encounter: Payer: Self-pay | Admitting: Family Medicine

## 2017-04-18 ENCOUNTER — Telehealth: Payer: Self-pay | Admitting: Family Medicine

## 2017-04-18 ENCOUNTER — Encounter: Payer: Self-pay | Admitting: Family Medicine

## 2017-04-18 DIAGNOSIS — M810 Age-related osteoporosis without current pathological fracture: Secondary | ICD-10-CM | POA: Insufficient documentation

## 2017-04-18 NOTE — Telephone Encounter (Signed)
Call pt: bone density score of -2.9.  Was -2.6 in 2016.  Continue with Fosamax, calcium, vit D and weight bearing exercise. Repeat DEXA in 2 years.

## 2017-04-23 NOTE — Telephone Encounter (Signed)
Spoke to patient gave her results as noted below. Jalessa Peyser,CMA  

## 2017-04-26 DIAGNOSIS — N816 Rectocele: Secondary | ICD-10-CM | POA: Insufficient documentation

## 2017-04-26 DIAGNOSIS — K582 Mixed irritable bowel syndrome: Secondary | ICD-10-CM | POA: Insufficient documentation

## 2017-04-26 DIAGNOSIS — K219 Gastro-esophageal reflux disease without esophagitis: Secondary | ICD-10-CM | POA: Diagnosis not present

## 2017-04-27 ENCOUNTER — Other Ambulatory Visit: Payer: Self-pay | Admitting: Family Medicine

## 2017-04-27 ENCOUNTER — Other Ambulatory Visit: Payer: Self-pay | Admitting: Rheumatology

## 2017-04-29 NOTE — Telephone Encounter (Signed)
Last Visit: 03/01/17 Next Visit: 08/02/17 Labs: 03/01/17 stable  Okay to refill per Dr. [name]

## 2017-04-30 ENCOUNTER — Ambulatory Visit (INDEPENDENT_AMBULATORY_CARE_PROVIDER_SITE_OTHER): Payer: Medicare Other | Admitting: Sports Medicine

## 2017-04-30 VITALS — BP 108/71 | HR 96 | Wt 183.0 lb

## 2017-04-30 DIAGNOSIS — E538 Deficiency of other specified B group vitamins: Secondary | ICD-10-CM

## 2017-04-30 MED ORDER — CYANOCOBALAMIN 1000 MCG/ML IJ SOLN
1000.0000 ug | Freq: Once | INTRAMUSCULAR | Status: AC
  Administered 2017-04-30: 1000 ug via INTRAMUSCULAR

## 2017-04-30 NOTE — Progress Notes (Signed)
Patient is here for B12 injection. Patient denies any complications from the last B12  injection given. Injection given in the RUQ without complication.

## 2017-05-04 ENCOUNTER — Inpatient Hospital Stay: Admit: 2017-05-04 | Payer: MEDICARE | Primary: Internal Medicine

## 2017-05-04 ENCOUNTER — Encounter: Admit: 2017-05-04 | Discharge: 2017-05-04 | Payer: MEDICARE | Primary: Internal Medicine

## 2017-05-04 DIAGNOSIS — E785 Hyperlipidemia, unspecified: Secondary | ICD-10-CM

## 2017-05-05 LAB — VITAMIN D, 25 HYDROXY: Vitamin D 25-Hydroxy: 79 ng/mL (ref 30–100)

## 2017-05-05 LAB — CBC W/O DIFF
HCT: 40.3 % (ref 35.0–45.0)
HGB: 12.4 g/dL (ref 12.0–16.0)
MCH: 29.8 PG (ref 24.0–34.0)
MCHC: 30.8 g/dL — ABNORMAL LOW (ref 31.0–37.0)
MCV: 96.9 FL (ref 74.0–97.0)
MPV: 9.6 FL (ref 9.2–11.8)
PLATELET: 193 10*3/uL (ref 135–420)
RBC: 4.16 M/uL — ABNORMAL LOW (ref 4.20–5.30)
RDW: 13.7 % (ref 11.6–14.5)
WBC: 6 10*3/uL (ref 4.6–13.2)

## 2017-05-05 LAB — METABOLIC PANEL, COMPREHENSIVE
A-G Ratio: 1.1 (ref 0.8–1.7)
ALT (SGPT): 26 U/L (ref 13–56)
AST (SGOT): 17 U/L (ref 15–37)
Albumin: 3.5 g/dL (ref 3.4–5.0)
Alk. phosphatase: 83 U/L (ref 45–117)
Anion gap: 7 mmol/L (ref 3.0–18)
BUN/Creatinine ratio: 14 (ref 12–20)
BUN: 9 MG/DL (ref 7.0–18)
Bilirubin, total: 0.3 MG/DL (ref 0.2–1.0)
CO2: 29 mmol/L (ref 21–32)
Calcium: 8.7 MG/DL (ref 8.5–10.1)
Chloride: 107 mmol/L (ref 100–108)
Creatinine: 0.63 MG/DL (ref 0.6–1.3)
GFR est AA: >60 mL/min/{1.73_m2} (ref >60)
GFR est non-AA: >60 mL/min/{1.73_m2} (ref >60)
Globulin: 3.1 g/dL (ref 2.0–4.0)
Glucose: 129 mg/dL — ABNORMAL HIGH (ref 74–99)
Potassium: 4 mmol/L (ref 3.5–5.5)
Protein, total: 6.6 g/dL (ref 6.4–8.2)
Sodium: 143 mmol/L (ref 136–145)

## 2017-05-05 LAB — HEMOGLOBIN A1C W/O EAG: Hemoglobin A1c: 6.1 % — ABNORMAL HIGH (ref 4.2–5.6)

## 2017-05-06 DIAGNOSIS — R350 Frequency of micturition: Secondary | ICD-10-CM | POA: Diagnosis not present

## 2017-05-06 DIAGNOSIS — N3941 Urge incontinence: Secondary | ICD-10-CM | POA: Diagnosis not present

## 2017-05-11 ENCOUNTER — Ambulatory Visit: Admit: 2017-05-11 | Discharge: 2017-05-11 | Payer: MEDICARE | Attending: Internal Medicine | Primary: Internal Medicine

## 2017-05-11 DIAGNOSIS — M25562 Pain in left knee: Secondary | ICD-10-CM | POA: Diagnosis not present

## 2017-05-11 DIAGNOSIS — M25561 Pain in right knee: Secondary | ICD-10-CM | POA: Diagnosis not present

## 2017-05-11 DIAGNOSIS — R7301 Impaired fasting glucose: Secondary | ICD-10-CM

## 2017-05-11 NOTE — Progress Notes (Signed)
66 y.o. white female who presents for f/u    She was having hematuria last year and had eval by urology which was unrevealing.  Earlier this year, she noted vaginal bleeding and eventually ended up seeing Dr Andrew Au and was rapidly worked up, subsequently dx'ed w endometrial ca.  She underwent TAH by Dr Sheral Flow, saw Dr Tiney Rouge and brachytherpay was not recommended    No gi complaints. She had to postpone the colo w Dr Rosendo Gros with the issues above but plans to reschedule at some point in the future    FBS <110, not checking pps.  Denies polyuria, polydipsia, nocturia, vision change.  She's been having a 'pity party' the last few months and not eating great    Vitals 05/11/2017 11/10/2016 08/25/2016 07/22/2016 07/13/2016   Weight 168 lb 168 lb 171 lb 9.6 oz 170 lb 170 lb     No sx referable to the thyroid    LAST MEDICARE WELLNESS EXAM: 11/10/16  ACP 11/10/16    IF 7/18    Past Medical History:   Diagnosis Date   ??? Allergic rhinitis    ??? Anxiety state, unspecified    ??? Arthritis     Dr. Synetta Shadow, Dr. Marisa Hua   ??? 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 Compression fx, thoracic spine ([place]) 2007    negative DEXA Dr. Marisa Hua   ??? Dyslipidemia    ??? Dyspepsia    ??? Frozen shoulder     right Dr. Wynonia Hazard MRI   ??? Left thyroid nodule 04/2016    1cm nodule; apparently noted on CT 2008 and unchanged   ??? Multiple lung nodules     no change 10/06, 03/07, 03/08   ??? Overweight (BMI 25.0-29.9)     IF 7/18   ??? Painless hematuria 04/2016    Dr Jimmye Norman, neg eval   ??? Palpitations 2008    neg thallium 2008, nl holter 2008, echo nl lv/ef 65%/tr mr/dd/nl pasp   ??? Prediabetes    ??? Syncope     neurocardiogenic by tilt 1994   ??? Venous insufficiency    ??? Vitamin D deficiency      Past Surgical History:   Procedure Laterality Date   ??? ECHO 2D ADULT  12/04    normal with EF 70%   ??? HX COLONOSCOPY      Dr Rosendo Gros 2007 negative    ??? HX GYN      s/p BTL    ??? HX HEMORRHOIDECTOMY      Dr. Rosendo Gros 2007   ??? HX HYSTERECTOMY  03/11/2017    Dr Sheral Flow   ??? HX ORTHOPAEDIC      DEXA -0.5 spine, -0.8 hip (1/18)   ??? HX UROLOGICAL  06/2016    Dr Jimmye Norman; bladder bx showed benign lesions   ??? STRESS TEST THALLIUM STUDY      2005 neg; 1/17 negative ef 75%   ??? [place] ABD COMP  9/05    negative   ??? VAS CAROTID DUPLEX BILATERAL  2004    negative     Social History     Social History   ??? Marital status: MARRIED     Spouse name: N/A   ??? Number of children: 2   ??? 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 Smoking status: Never Smoker   ??? Smokeless tobacco: Never Used   ??? Alcohol use 2.4 oz/week     4 Glasses of wine per week   ??? Drug use: No   ??? Sexual activity: Not on file     Other Topics Concern   ??? Not on file     Social History Narrative     Current Outpatient Prescriptions   Medication Sig   ??? ergocalciferol (VITAMIN D2) 50,000 unit capsule Take 50,000 Units by mouth every thirty (30) days.   ??? atorvastatin (LIPITOR) 40 mg tablet take 1 tablet by mouth once daily   ??? citalopram (CELEXA) 10 mg tablet take 1 tablet by mouth once daily   ??? lansoprazole (PREVACID) 30 mg capsule TAKE 1 CAPSULE BY MOUTH BEFORE BREAKFAST DAILY.   ??? omega-3 fatty acids (FISH OIL) cap Take 2 Tabs by mouth daily.   ??? multivitamin (ONE A DAY) tablet Take 1 Tab by mouth daily.   ??? aspirin 81 mg chewable tablet Take 81 mg by mouth daily.     No current facility-administered medications for this visit.      Allergies   Allergen Reactions   ??? Fish Oil Nausea Only   ??? Relafen [Nabumetone] Nausea Only     REVIEW OF SYSTEMS: gyn 4/18 Dr Andrew Au, Clotilde Dieter 6/14, colo 2007 Dr Rosendo Gros, [place] 2007? Dr Margaretann Loveless ??? no vision change or eye pain  Oral ??? no mouth pain, tongue or tooth problems  Ears ??? no hearing loss, ear pain, fullness, no swallowing problems  Cardiac ??? no CP, PND, orthopnea, edema, palpitations or syncope   Chest ??? no breast masses  Resp ??? no wheezing, chronic coughing, dyspnea  Urinary ??? no dysuria, hematuria, flank pain, urgency, frequency  Ortho ??? no swelling, dec ROM, myalgias  Psych ??? denies any anxiety or depression symptoms, no hallucinations or violent ideation  Endo - no polyuria, polydipsia, nocturia, hot flashes    Visit Vitals   ??? BP 96/72   ??? Pulse 75   ??? Temp 98.6 ??F (37 ??C) (Oral)   ??? Resp 14   ??? Ht '5\' 4"'  (1.626 m)   ??? Wt 168 lb (76.2 kg)   ??? SpO2 98%   ??? BMI 28.84 kg/m2   Affect is appropriate.  Mood stable  No apparent distress  HEENT --Anicteric sclerae.  No JVD, or bruits.  Thyroid fullness on left  Lungs --Clear to auscultation and percussion, normal percussion.  Heart --Regular rate and rhythm, no murmurs, rubs, gallops, or clicks.  Abdomen -- Soft and nontender, no hepatosplenomegaly or masses.  Extremities -- Without cyanosis, clubbing, edema. 2+ pulses equally and bilaterally.    LABS  From 5/10 showed   gluc 110, cr 0.70,               alt 23,                                    chol 154, tg 143, hdl 45, ldl-c 80,                                                            tsh 1.91  From 5/10 showed                      2 hr GTT 89  From 8/10 showed                                                               vit d 23.0, ck 57, aldo 5.4  From 8/11 showed   gluc 108,                                     hba1c 6.3,                   chol 160, tg 172, hdl 39, ldl-c 87,  wbc 5.,7 hb 12.3, plt 235, ua neg,     tsh 2.28  From 5/12 showed   gluc 113, cr 0.71, gfr 94,  alt 16, hba1c 6.2, ldl-p 1989, chol 177, tg 161, hdl 43, ldl-c 102  From 11/12 showed                                                     hba1c 5.9, ldl-p 1218, chol 133, tg 116, hdl 45, ldl-c 65  From 5/13 showed   gluc 105, cr 0.55, gfr 102, alt 8,  hba1c 6.2,                   chol 148, tg 107, hdl 45, ldl-c 82,  wbc 5.0, hb 12.5, plt 172, vit d 40.3  From 11/13 showed        hba1c 6.2, ldl-p 1888, chol 176, tg 155, hdl 49,  ldl-c 86,  wbc 6.2, hb 12.3, plt 182, vit d 34.4  From 5/14 showed        hba1c 6.2,     chol 152, tg 157, hdl 39, ldl-c 82  From 1/15 showed   gluc 113, cr 0.68, gfr>60, alt 11, hba1c 6.2,     chol 146, tg 130, hdl 43, ldl-c 77  From 7/15 showed        hba1c 6.3,     chol 133, tg 124, hdl 39, ldl-c 69, wbc 6.3, hb 12.2, plt 193  From 6/16 showed   gluc 106, cr 0.74, gfr>60, alt 26, hba1c 6.3,     chol 126, tg 125, hdl 46, ldl-c 55, wbc 5.7, hb 13.2, plt 203, vit d 7.2,   tsh 1.69  From 12/16 showed gluc 110, cr 0.68, gfr>60, alt 30, hba1c 6.1,     chol 135, tg 147, hdl 47, ldl-c 59, wbc 6.0, hb 12.7, plt 197  From 6/17 showed   gluc 122, cr 0.71, gfr>60, alt 33, hba1c 6.2,     chol 153, tg 140, hdl 46, ldl-c 79, wbc 6.3, hb 13.1, plt 190,        tsh 1.92, hep c neg  From 1/18 showed        hba1c 6.2,    chol 154, tg 181, hdl 41, ldl-c 77    Results for orders placed  or performed during the hospital encounter of 16/10/96   METABOLIC PANEL, COMPREHENSIVE   Result Value Ref Range    Sodium 143 136 - 145 mmol/L    Potassium 4.0 3.5 - 5.5 mmol/L    Chloride 107 100 - 108 mmol/L    CO2 29 21 - 32 mmol/L    Anion gap 7 3.0 - 18 mmol/L    Glucose 129 (H) 74 - 99 mg/dL    BUN 9 7.0 - 18 MG/DL    Creatinine 0.63 0.6 - 1.3 MG/DL    BUN/Creatinine ratio 14 12 - 20      GFR est AA >60 >60 ml/min/1.40m    GFR est non-AA >60 >60 ml/min/1.712m   Calcium 8.7 8.5 - 10.1 MG/DL    Bilirubin, total 0.3 0.2 - 1.0 MG/DL    ALT (SGPT) 26 13 - 56 U/L    AST (SGOT) 17 15 - 37 U/L    Alk. phosphatase 83 45 - 117 U/L    Protein, total 6.6 6.4 - 8.2 g/dL    Albumin 3.5 3.4 - 5.0 g/dL    Globulin 3.1 2.0 - 4.0 g/dL    A-G Ratio 1.1 0.8 - 1.7     CBC W/O DIFF   Result Value Ref Range    WBC 6.0 4.6 - 13.2 K/uL    RBC 4.16 (L) 4.20 - 5.30 M/uL    HGB 12.4 12.0 - 16.0 g/dL    HCT 40.3 35.0 - 45.0 %    MCV 96.9 74.0 - 97.0 FL    MCH 29.8 24.0 - 34.0 PG    MCHC 30.8 (L) 31.0 - 37.0 g/dL    RDW 13.7 11.6 - 14.5 %    PLATELET 193 135 - 420 K/uL     MPV 9.6 9.2 - 11.8 FL   HEMOGLOBIN A1C W/O EAG   Result Value Ref Range    Hemoglobin A1c 6.1 (H) 4.2 - 5.6 %   VITAMIN D, 25 HYDROXY   Result Value Ref Range    Vitamin D 25-Hydroxy 79.0 30 - 100 ng/mL     Patient Active Problem List   Diagnosis Code   ??? Vitamin D deficiency E55.9   ??? Anxiety F41.9   ??? Dyslipidemia E78.5   ??? 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 Overweight (BMI 25.0-29.9) E66.3   ??? IFG (impaired fasting glucose) R73.01     Assessment and plan:  1. Prediabetes. Lifestyle and dietary modification reiterated. Weight loss advocated.   2. Hyperlipidemia. Continue current  3. Hypovitaminosis D. Dec to monthly supplementation  4. Anxiety.  Continue celexa  5. Overweight.  Dec app suppressants, med supervised wt loss, nutrition/dietician in the past.  Intermittent fasting discussed at length.  Explained the concepts in detail.  Went over possible physiologic changes that could occur and how that would possibly impact her situation in a positive way.  Discussed 16:8 program in particular.  We also went over the differences between hunger and frank hypoglycemia.  Look up low insulin index foods. She will do some more research and consider implementing in the near future, standard handout given  6. Colo to be scheduled by her w Dr RaRosendo Gros7. Endometrial ca.  Per Dr SqSheral Flow8. Possible DM?  Very long discussion. Declined 2hr GTT, wants to try fasting regimen above and reevaluate in a few months      RTC 1/19    Above conditions discussed at length and patient vocalized understanding.  All  questions answered to patient satisfaction      TOTAL time 40 min spent of which at least 30 min was on counseling, answering questions and coordination of care

## 2017-05-11 NOTE — ACP (Advance Care Planning) (Signed)
Do you have an Advanced Directive? YES

## 2017-05-11 NOTE — Progress Notes (Signed)
1. Have you been to the ER, urgent care clinic or hospitalized since your last visit? YES.  Mar 11 2017 at [facility] with Dr.Robert [place]     2. Have you seen or consulted any other health care providers outside of the [facility] since your last visit (Include any pap smears or colon screening)? NO      Do you have an Advanced Directive? YES  Chief Complaint   Patient presents with   ??? Cholesterol Problem     6 month follow up with labs   ??? Medication Evaluation     pt wanting to increase dosage on celexa

## 2017-05-18 DIAGNOSIS — N816 Rectocele: Secondary | ICD-10-CM | POA: Diagnosis not present

## 2017-05-20 ENCOUNTER — Ambulatory Visit (INDEPENDENT_AMBULATORY_CARE_PROVIDER_SITE_OTHER): Payer: Medicare Other | Admitting: Adult Health

## 2017-05-20 ENCOUNTER — Encounter: Payer: Self-pay | Admitting: Adult Health

## 2017-05-20 VITALS — BP 116/74 | HR 77 | Wt 179.4 lb

## 2017-05-20 DIAGNOSIS — R269 Unspecified abnormalities of gait and mobility: Secondary | ICD-10-CM | POA: Diagnosis not present

## 2017-05-20 DIAGNOSIS — R413 Other amnesia: Secondary | ICD-10-CM

## 2017-05-20 NOTE — Progress Notes (Signed)
I have read the note, and I agree with the clinical assessment and plan.  Mia [name]   

## 2017-05-20 NOTE — Progress Notes (Signed)
PATIENT: Mia [name] DOB: [birth year]  REASON FOR VISIT: follow up-memory disturbance HISTORY FROM: patient  HISTORY OF PRESENT ILLNESS: Today 05/20/17 Mia [name] is a 66 year old female with a history of memory disturbance. She returns today for follow-up. She remains on 5 mg of Aricept and Namenda 10 mg twice a day. She states that she feels her memory has remained stable. She states that she has a hard time remembering if she took her medication. She has a hard time remembering names. Her husband notes that sometimes she seems more confused. She denies any trouble driving. She states that she no longer prepares meals however she does not contribute this to her memory but rather being retired. She does note that she has messed up recipes on occasion. She continues to manage her own finances. She denies any trouble sleeping. She does note that she's been having more trouble with her balance. She states that this has been ongoing for several years. She recently fell out of bed. Fortunately she did not suffer any injuries other than bruising. She reports she is able to complete all ADLs independently. She returns today for an evaluation.  HISTORY 11/17/16: Mia [name] is a 66 year old female with a history of mild memory disturbance. She returns today for follow-up. She is currently on Aricept. She was supposed to start Namenda however she reports that the titration pack was not covered by her insurance.  She continues to live at home alone. She is able to operate a motor vehicle although she reports that she does not drive very often. She manages her own finances. Able to prepare her own meals. Although she has found that she has left the stove on. She has burned food. She is able to complete all ADLs independently. She continues to have some trouble with her gait. She is currently wearing a boot on the left leg for shin splints. She denies any new neurological symptoms. She returns today for an  evaluation.  REVIEW OF SYSTEMS: Out of a complete 14 system review of symptoms, the patient complains only of the following symptoms, and all other reviewed systems are negative.  Eye itching, excessive sweating, swollen abdomen, incontinence of bladder, frequency of urination, joint pain, joint swelling, back pain, aching muscles, walking difficulty, apnea, memory loss, headache  ALLERGIES: Allergies  Allergen Reactions  . Myrbetriq  [Mirabegron] Other (See Comments)    SEVERE HEADACHE  . Percocet [Oxycodone-Acetaminophen] Nausea And Vomiting  . Celebrex [Celecoxib] Other (See Comments)    Other reaction(s): Other flushed  . Codeine Nausea And Vomiting  . Darvocet [Propoxyphene N-Acetaminophen] Nausea And Vomiting  . Erythromycin Nausea And Vomiting    Nausea and vomiting   . Hydrocodone Nausea And Vomiting  . Lyrica [Pregabalin] Swelling    Legs swelling   . Macrodantin [Nitrofurantoin] Nausea And Vomiting  . Toradol [Ketorolac Tromethamine] Nausea And Vomiting    Per patient, only PO form causes nausea and vomiting. Can take injection without issue  . Tramadol Nausea And Vomiting  . Verapamil Nausea And Vomiting    REACTION: intolerance    HOME MEDICATIONS: Outpatient Medications Prior to Visit  Medication Sig Dispense Refill  . alendronate (FOSAMAX) 70 MG tablet Take 1 tablet by mouth  every 7 days with a full  glass of water on an empty  stomach 12 tablet 4  . AMBULATORY NON FORMULARY MEDICATION Medication Name: One touch ultra strips. Check fasting blood sugar in the morning and as needed.  Dx - [phone] Fax  to [phone] 50 strip 11  . atorvastatin (LIPITOR) 40 MG tablet Take 1 tablet (40 mg total) by mouth daily. 90 tablet 3  . calcium carbonate (OS-CAL) 600 MG TABS Take 600 mg by mouth daily with breakfast.     . cetirizine (ZYRTEC) 10 MG tablet Take 10 mg by mouth as needed for allergies.    . Cyanocobalamin (VITAMIN B-12 IJ) Inject 1,000 mg as directed every 28  (twenty-eight) days.    . cyclobenzaprine (FLEXERIL) 10 MG tablet One half tab PO qHS, then increase gradually to one tab TID. (Patient taking differently: as needed. One half tab PO qHS, then increase gradually to one tab TID.) 30 tablet 0  . donepezil (ARICEPT) 5 MG tablet Take 1 tablet (5 mg total) by mouth at bedtime. 90 tablet 3  . fesoterodine (TOVIAZ) 8 MG TB24 tablet Take 8 mg by mouth daily.    . furosemide (LASIX) 20 MG tablet Take 1 tablet (20 mg total) by mouth daily. 90 tablet 3  . gabapentin (NEURONTIN) 300 MG capsule TAKE 1 TO 2 CAPSULES BY MOUTH UP TO TWO TIMES DAILY AS NEEDED 180 capsule 3  . ipratropium (ATROVENT) 0.03 % nasal spray Place 2 sprays into both nostrils every 12 (twelve) hours as needed for rhinitis.    Marland Kitchen leflunomide (ARAVA) 20 MG tablet TAKE 1 TABLET EVERY DAY 90 tablet 0  . memantine (NAMENDA) 10 MG tablet Take 1 tablet (10 mg total) by mouth 2 (two) times daily. 180 tablet 4  . metFORMIN (GLUCOPHAGE-XR) 500 MG 24 hr tablet Take 1 tablet (500 mg total) by mouth daily with breakfast. 90 tablet 1  . NON FORMULARY Place into the nose at bedtime. cpap machine    . ondansetron (ZOFRAN-ODT) 4 MG disintegrating tablet Take 4 mg by mouth every 6 (six) hours as needed for nausea or vomiting.    . ONE TOUCH ULTRA TEST test strip USE TO TEST BLOOD SUGAR ONCE DAILY 100 each 0  . primidone (MYSOLINE) 50 MG tablet TAKE 1 TABLET AT BEDTIME 90 tablet 0  . propranolol (INDERAL) 10 MG tablet Take 1 tablet (10 mg total) by mouth 4 (four) times daily as needed. 60 tablet 6  . SUMAtriptan (IMITREX) 50 MG tablet Take 1 tablet (50 mg total) by mouth every 2 (two) hours as needed. 9 tablet 6  . venlafaxine XR (EFFEXOR-XR) 75 MG 24 hr capsule TAKE 1 CAPSULE BY MOUTH  DAILY WITH BREAKFAST 90 capsule 3  . vitamin D, CHOLECALCIFEROL, 400 UNITS tablet Take 400 Units by mouth daily.     . metoprolol (LOPRESSOR) 50 MG tablet Take 1.5 tablets (75 mg total) by mouth 2 (two) times daily. 270 tablet  3  . pantoprazole (PROTONIX) 40 MG tablet TAKE 1 TABLET BY MOUTH TWO  TIMES DAILY 180 tablet 1   No facility-administered medications prior to visit.     PAST MEDICAL HISTORY: Past Medical History:  Diagnosis Date  . Anal fissure   . Anemia   . Arthritis   . Blood transfusion   . C. difficile colitis   . Chest pain    a. 01/2013 MV: EF 59%, no ischemia.  . Chronic Dyspnea    a. 01/2013 Echo: EF 60-65%, Gr 1 DD, PASP 40mmHg.  . Diabetes mellitus   . Fibromyalgia   . Gall stones   . GERD (gastroesophageal reflux disease)    gastritis  . Hemorrhoids   . Hypertension   . Interstitial cystitis   . Memory difficulty 12/14/2013  .  Neuromuscular disorder ([place])    sclerosis  . Osteoporosis   . Peptic ulcer   . PONV (postoperative nausea and vomiting)   . Pseudogout   . Raynaud phenomenon   . Rectal bleeding   . Sleep apnea    a. on cpap.  Marland Kitchen SVT (supraventricular tachycardia) ([place])   . Syncope 09/10/2015  . Thyroid disease    hypothyroidism  . Tremor, essential 05/14/2016    PAST SURGICAL HISTORY: Past Surgical History:  Procedure Laterality Date  . APPENDECTOMY    . BLADDER SURGERY     x2  . BLADDER SURGERY    . BREAST BIOPSY    . CARDIAC CATHETERIZATION    . CATARACT EXTRACTION Bilateral   . CHOLECYSTECTOMY    . DILATION AND CURETTAGE OF UTERUS    . ENTEROCELE REPAIR     x2  . EYE SURGERY     retina  . hysterectomy - unknown type    . KNEE SURGERY     x6  . NECK SURGERY     fusion  . OOPHORECTOMY    . QUADRICEPS REPAIR Right   . RECTOCELE REPAIR     x2  . SHOULDER ARTHROSCOPY WITH SUBACROMIAL DECOMPRESSION, ROTATOR CUFF REPAIR AND BICEP TENDON REPAIR  10/06/2012   Procedure: SHOULDER ARTHROSCOPY WITH SUBACROMIAL DECOMPRESSION, ROTATOR CUFF REPAIR AND BICEP TENDON REPAIR;  Surgeon: Nita Sells, MD;  Location: [facility];  Service: Orthopedics;  Laterality: Right;  Arthroscopic  Repair  of  Subscapularis, Open Biceps Tenodesis  .  SHOULDER SURGERY     bilateral- bones spur  . TONSILLECTOMY    . TOTAL KNEE ARTHROPLASTY      FAMILY HISTORY: Family History  Problem Relation Age of Onset  . Heart attack Mother   . Stroke Mother   . Diabetes Mother   . Heart disease Mother   . Colon polyps Mother   . Asthma Mother   . Breast cancer Maternal Grandmother   . Wilson's disease Maternal Grandmother   . Pancreatic cancer Maternal Grandfather   . Heart disease Father   . Heart attack Father   . Asthma Father   . Stroke Sister   . Hypertension Sister   . Rheum arthritis Sister   . Dementia Sister   . Asthma Sister   . Lupus Sister   . Asthma Sister     SOCIAL HISTORY: Social History   Social History  . Marital status: Married    Spouse name: N/A  . Number of children: 2  . Years of education: 14   Occupational History  . Retired    Social History Main Topics  . Smoking status: Never Smoker  . Smokeless tobacco: Never Used  . Alcohol use No  . Drug use: No  . Sexual activity: Not on file   Other Topics Concern  . Not on file   Social History Narrative   Patient drinks about 3-4 cups of caffeine daily.   Patient is right handed.      PHYSICAL EXAM  Vitals:   05/20/17 0912  BP: 116/74  Pulse: 77  Weight: 179 lb 6.4 oz (81.4 kg)   Body mass index is 29.53 kg/m.   MMSE - Mini Mental State Exam 05/20/2017 11/17/2016 05/14/2016  Orientation to time 4 3 3   Orientation to Place 5 5 5   Registration 3 3 3   Attention/ Calculation 1 5 5   Recall 1 1 3   Language- name 2 objects 2 2 2   Language- repeat  1 1 1   Language- follow 3 step command 3 2 3   Language- read & follow direction 1 1 1   Write a sentence 1 1 1   Copy design 0 1 1  Total score 22 25 28      Generalized: Well developed, in no acute distress   Neurological examination  Mentation: Alert oriented to time, place, history taking. Follows all commands speech and language fluent Cranial nerve II-XII: Pupils were equal round  reactive to light. Extraocular movements were full, visual field were full on confrontational test. Facial sensation and strength were normal. Uvula tongue midline. Head turning and shoulder shrug  were normal and symmetric. Motor: The motor testing reveals 5 over 5 strength of all 4 extremities. Good symmetric motor tone is noted throughout.  Sensory: Sensory testing is intact to soft touch on all 4 extremities. No evidence of extinction is noted.  Coordination: Cerebellar testing reveals good finger-nose-finger and heel-to-shin bilaterally.  Gait and station: Gait is unsteady. Tandem gait is unsteady. Romberg is negative but unsteady. Reflexes: Deep tendon reflexes are symmetric and normal bilaterally.   DIAGNOSTIC DATA (LABS, IMAGING, TESTING) - I reviewed patient records, labs, notes, testing and imaging myself where available.  Lab Results  Component Value Date   WBC 7.4 03/01/2017   HGB 13.4 03/01/2017   HCT 41.2 03/01/2017   MCV 93.2 03/01/2017   PLT 433 (H) 03/01/2017      Component Value Date/Time   NA 140 03/01/2017 1043   NA 140 11/15/2015 1304   K 4.4 03/01/2017 1043   CL 102 03/01/2017 1043   CO2 30 03/01/2017 1043   GLUCOSE 104 (H) 03/01/2017 1043   BUN 9 03/01/2017 1043   BUN 18 11/15/2015 1304   CREATININE 0.83 03/01/2017 1043   CALCIUM 9.9 03/01/2017 1043   PROT 6.6 03/01/2017 1043   PROT 7.4 11/15/2015 1304   ALBUMIN 4.1 03/01/2017 1043   ALBUMIN 4.8 11/15/2015 1304   AST 25 03/01/2017 1043   ALT 23 03/01/2017 1043   ALKPHOS 85 03/01/2017 1043   BILITOT 0.3 03/01/2017 1043   BILITOT <0.2 11/15/2015 1304   GFRNONAA 74 03/01/2017 1043   GFRAA 86 03/01/2017 1043   Lab Results  Component Value Date   CHOL 133 08/28/2016   HDL 41 (L) 08/28/2016   LDLCALC 70 08/28/2016   TRIG 110 08/28/2016   CHOLHDL 3.2 08/28/2016   Lab Results  Component Value Date   HGBA1C 5.7 01/04/2017   Lab Results  Component Value Date   VITAMINB12 441 03/29/2017   Lab  Results  Component Value Date   TSH 1.64 03/29/2017      ASSESSMENT AND PLAN 66 y.o. year old female  has a past medical history of Anal fissure; Anemia; Arthritis; Blood transfusion; C. difficile colitis; Chest pain; Chronic Dyspnea; Diabetes mellitus; Fibromyalgia; Gall stones; GERD (gastroesophageal reflux disease); Hemorrhoids; Hypertension; Interstitial cystitis; Memory difficulty (12/14/2013); Neuromuscular disorder ([place]); Osteoporosis; Peptic ulcer; PONV (postoperative nausea and vomiting); Pseudogout; Raynaud phenomenon; Rectal bleeding; Sleep apnea; SVT (supraventricular tachycardia) ([place]); Syncope (09/10/2015); Thyroid disease; and Tremor, essential (05/14/2016). here with:  1. Memory disturbance 2. Abnormality of gait  The patient's memory score has declined slightly. She will continue on Aricept and Namenda. The patient has having trouble with her gait. The patient did have a lumbar MRI in March 2018 that showed multilevel degenerative changes. Patient advised that if her symptoms do not improve she should let us know. She will follow-up in 6 months or sooner if needed.  Witter Givens, MSN, NP-C 05/20/2017, 9:30 AM [facility] [address] [place], [place] [address] [phone]

## 2017-05-20 NOTE — Patient Instructions (Addendum)
Your Plan:  Continue Aricept and Namenda Memory score slightly declined  Referral to Physcial therapy    Thank you for coming to see [place] at [facility]. I hope we have been able to provide you high quality care today.  You may receive a patient satisfaction survey over the next few weeks. We would appreciate your feedback and comments so that we may continue to improve ourselves and the health of our patients.

## 2017-05-27 DIAGNOSIS — N3941 Urge incontinence: Secondary | ICD-10-CM | POA: Diagnosis not present

## 2017-06-02 ENCOUNTER — Ambulatory Visit (INDEPENDENT_AMBULATORY_CARE_PROVIDER_SITE_OTHER): Payer: Medicare Other | Admitting: Family Medicine

## 2017-06-02 VITALS — BP 128/70 | HR 81 | Wt 180.0 lb

## 2017-06-02 DIAGNOSIS — E538 Deficiency of other specified B group vitamins: Secondary | ICD-10-CM

## 2017-06-02 MED ORDER — CYANOCOBALAMIN 1000 MCG/ML IJ SOLN
1000.0000 ug | INTRAMUSCULAR | Status: DC
  Administered 2017-06-02 – 2017-07-30 (×2): 1000 ug via INTRAMUSCULAR

## 2017-06-02 NOTE — Progress Notes (Signed)
Agree with below. \Mia Metheney, MD  

## 2017-06-02 NOTE — Patient Instructions (Signed)
Patient is here for B12 injection, denies gastrointestinal problems or dizziness. Patient tolerated injection well without any complications. Patient advised to schedule next injection in 30 days

## 2017-06-02 NOTE — Progress Notes (Signed)
Patient is here for B12 injection, denies gastrointestinal problems or dizziness. Patient tolerated injection well without any complications. Patient advised to schedule next injection in 30 days

## 2017-06-04 ENCOUNTER — Other Ambulatory Visit: Payer: Self-pay

## 2017-06-04 DIAGNOSIS — Z79899 Other long term (current) drug therapy: Secondary | ICD-10-CM

## 2017-06-04 LAB — CBC WITH DIFFERENTIAL/PLATELET
Basophils Absolute: 75 cells/uL (ref 0–200)
Basophils Relative: 1 %
Eosinophils Absolute: 150 cells/uL (ref 15–500)
Eosinophils Relative: 2 %
HCT: 42.2 % (ref 35.0–45.0)
Hemoglobin: 13.7 g/dL (ref 11.7–15.5)
Lymphocytes Relative: 28 %
Lymphs Abs: 2100 cells/uL (ref 850–3900)
MCH: 30.6 pg (ref 27.0–33.0)
MCHC: 32.5 g/dL (ref 32.0–36.0)
MCV: 94.2 fL (ref 80.0–100.0)
MPV: 10.7 fL (ref 7.5–12.5)
Monocytes Absolute: 675 cells/uL (ref 200–950)
Monocytes Relative: 9 %
Neutro Abs: 4500 cells/uL (ref 1500–7800)
Neutrophils Relative %: 60 %
Platelets: 329 10*3/uL (ref 140–400)
RBC: 4.48 MIL/uL (ref 3.80–5.10)
RDW: 14.1 % (ref 11.0–15.0)
WBC: 7.5 10*3/uL (ref 3.8–10.8)

## 2017-06-05 LAB — COMPLETE METABOLIC PANEL WITH GFR
ALT: 24 U/L (ref 6–29)
AST: 23 U/L (ref 10–35)
Albumin: 4.3 g/dL (ref 3.6–5.1)
Alkaline Phosphatase: 85 U/L (ref 33–130)
BUN: 13 mg/dL (ref 7–25)
CO2: 26 mmol/L (ref 20–32)
Calcium: 9.8 mg/dL (ref 8.6–10.4)
Chloride: 100 mmol/L (ref 98–110)
Creat: 0.93 mg/dL (ref 0.50–0.99)
GFR, Est African American: 75 mL/min (ref >=60)
GFR, Est Non African American: 65 mL/min (ref >=60)
Glucose, Bld: 126 mg/dL — ABNORMAL HIGH (ref 65–99)
Potassium: 4.6 mmol/L (ref 3.5–5.3)
Sodium: 138 mmol/L (ref 135–146)
Total Bilirubin: 0.3 mg/dL (ref 0.2–1.2)
Total Protein: 6.7 g/dL (ref 6.1–8.1)

## 2017-06-05 NOTE — Progress Notes (Signed)
Glu mildly elevated. Probably not fasting.

## 2017-06-15 ENCOUNTER — Telehealth: Payer: Self-pay

## 2017-06-15 NOTE — Telephone Encounter (Signed)
Pt had a vit D drawn on 03/31/17.  Pt's insurance is not paying for it, can a new code be put in for vit. D deficiency?  Please advise.

## 2017-06-15 NOTE — Telephone Encounter (Signed)
Yes, these call the lab and resubmit under vitamin D deficiency.

## 2017-06-17 DIAGNOSIS — N3941 Urge incontinence: Secondary | ICD-10-CM | POA: Diagnosis not present

## 2017-06-17 NOTE — Telephone Encounter (Signed)
Spoke with [facility] in [place] billing.  Code changed

## 2017-06-18 ENCOUNTER — Ambulatory Visit (INDEPENDENT_AMBULATORY_CARE_PROVIDER_SITE_OTHER): Payer: Medicare Other | Admitting: Cardiovascular Disease

## 2017-06-18 ENCOUNTER — Encounter: Payer: Self-pay | Admitting: Cardiovascular Disease

## 2017-06-18 VITALS — BP 126/84 | HR 74 | Ht 66.0 in | Wt 180.1 lb

## 2017-06-18 DIAGNOSIS — E782 Mixed hyperlipidemia: Secondary | ICD-10-CM | POA: Diagnosis not present

## 2017-06-18 DIAGNOSIS — I10 Essential (primary) hypertension: Secondary | ICD-10-CM | POA: Diagnosis not present

## 2017-06-18 DIAGNOSIS — I471 Supraventricular tachycardia: Secondary | ICD-10-CM | POA: Diagnosis not present

## 2017-06-18 LAB — LIPID PANEL
Chol/HDL Ratio: 3 ratio (ref 0.0–4.4)
Cholesterol, Total: 119 mg/dL (ref 100–199)
HDL: 40 mg/dL (ref >39)
LDL Calculated: 47 mg/dL (ref 0–99)
Triglycerides: 161 mg/dL — ABNORMAL HIGH (ref 0–149)
VLDL Cholesterol Cal: 32 mg/dL (ref 5–40)

## 2017-06-18 NOTE — Patient Instructions (Signed)
Medication Instructions:  Your physician recommends that you continue on your current medications as directed. Please refer to the Current Medication list given to you today.   Labwork: TODAY - cholesterol   Testing/Procedures: None Ordered   Follow-Up: Your physician wants you to follow-up in: 6 months with Dr. Nahser.  You will receive a reminder letter in the mail two months in advance. If you don't receive a letter, please call our office to schedule the follow-up appointment.   If you need a refill on your cardiac medications before your next appointment, please call your pharmacy.   Thank you for choosing [facility]! Tonyia Marschall, RN [phone]    

## 2017-06-18 NOTE — Progress Notes (Signed)
Cardiology Office Note   Date:  06/18/2017   ID:  Mia [name], DOB Mar 16, 1951, MRN [id number]  PCP:  Mia Marry, MD  Cardiologist:   Mia Moores, MD   Chief Complaint  Patient presents with  . Follow-up    SVT, hyperlipidemia    1. SVT 2. Raynaud's Phenomenon 3. Diabetes Mellitus 4. Minimal CAD by cath 2006 Mia [name]) 5. Sleep apnea - CPAP is currently not working  02/11/05 Right heart catheterization:  RA pressure: 8 mmHg  RV pressure: 25/7 mmHg  PA pressure: 20/9 mmHg  PWCP: 9 mmHg  CO: 4.6 liters per minute  LEFT HEART CATHETERIZATION RESULTS:  1. Left main: No significant disease.  2. LAD: Mild luminal irregularities.  3. First and second diagonal: Moderate size, mild luminal irregularities.  4. Left circumflex: Nondominant, mild luminal irregularities.  5. RCA: Dominant with mild luminal irregularities.  6. LV: EF is 55 to 60%, no wall motion abnormalities. LV EDP was 9 mmHg.   December 28, 2014:  Mia [name] is a 66 y.o. female who presents for follow-up with an episode of chest pain. She was seen by our nurse practitioner, Mia [name].    Diltiazem was DC'd.   She was started on Metoprolol 50 bid.   Myoview study was normal. She has normal left ventricle systolic function. She is going to the [facility] 3 times a week.  Doing well.     She is feeling much better.    Sept. 1, 2016:  Has had C diff for the past several months .  Doing well from a cardiac standpoint .  Has been fatigued.  Doing well with the metoprolol  Still has shortness of breath .   Nov. 3, 2017:    Mia [name] is seen today with her husband, Mia [name].   Last week she started having some heart facing .  HR was 187 (using the app on her phone )  Could feel it in her ears. Got a little dizzy.  Sat down and relaxed and the episode resolved.  Had another episode this past Sunday .  HR stayed elevated for 2 hours   Aug. 24, 2018: Mia [name] is seen today with husband Mia [name].    Followed for HTN and hyperlipidemia  Has had some elevated diastolic BP readings . Eats some salty foods still - likes ham. Complains of leg pain / ache when she is standing .    Past Medical History:  Diagnosis Date  . Anal fissure   . Anemia   . Arthritis   . Blood transfusion   . C. difficile colitis   . Chest pain    a. 01/2013 MV: EF 59%, no ischemia.  . Chronic Dyspnea    a. 01/2013 Echo: EF 60-65%, Gr 1 DD, PASP 30mmHg.  . Diabetes mellitus   . Fibromyalgia   . Gall stones   . GERD (gastroesophageal reflux disease)    gastritis  . Hemorrhoids   . Hypertension   . Interstitial cystitis   . Memory difficulty 12/14/2013  . Neuromuscular disorder ([place])    sclerosis  . Osteoporosis   . Peptic ulcer   . PONV (postoperative nausea and vomiting)   . Pseudogout   . Raynaud phenomenon   . Rectal bleeding   . Sleep apnea    a. on cpap.  Marland Kitchen SVT (supraventricular tachycardia) ([place])   . Syncope 09/10/2015  . Thyroid disease    hypothyroidism  . Tremor, essential 05/14/2016    Past  Surgical History:  Procedure Laterality Date  . APPENDECTOMY    . BLADDER SURGERY     x2  . BLADDER SURGERY    . BREAST BIOPSY    . CARDIAC CATHETERIZATION    . CATARACT EXTRACTION Bilateral   . CHOLECYSTECTOMY    . DILATION AND CURETTAGE OF UTERUS    . ENTEROCELE REPAIR     x2  . EYE SURGERY     retina  . hysterectomy - unknown type    . KNEE SURGERY     x6  . NECK SURGERY     fusion  . OOPHORECTOMY    . QUADRICEPS REPAIR Right   . RECTOCELE REPAIR     x2  . SHOULDER ARTHROSCOPY WITH SUBACROMIAL DECOMPRESSION, ROTATOR CUFF REPAIR AND BICEP TENDON REPAIR  10/06/2012   Procedure: SHOULDER ARTHROSCOPY WITH SUBACROMIAL DECOMPRESSION, ROTATOR CUFF REPAIR AND BICEP TENDON REPAIR;  Surgeon: Nita Sells, MD;  Location: [facility];  Service: Orthopedics;  Laterality: Right;  Arthroscopic  Repair  of  Subscapularis, Open Biceps Tenodesis  . SHOULDER SURGERY      bilateral- bones spur  . TONSILLECTOMY    . TOTAL KNEE ARTHROPLASTY       Current Outpatient Prescriptions  Medication Sig Dispense Refill  . alendronate (FOSAMAX) 70 MG tablet Take 1 tablet by mouth  every 7 days with a full  glass of water on an empty  stomach 12 tablet 4  . AMBULATORY NON FORMULARY MEDICATION Medication Name: One touch ultra strips. Check fasting blood sugar in the morning and as needed.  Dx - [id number] Fax to [phone] 50 strip 11  . atorvastatin (LIPITOR) 40 MG tablet Take 1 tablet (40 mg total) by mouth daily. 90 tablet 3  . calcium carbonate (OS-CAL) 600 MG TABS Take 600 mg by mouth daily with breakfast.     . cetirizine (ZYRTEC) 10 MG tablet Take 10 mg by mouth as needed for allergies.    . Cyanocobalamin (VITAMIN B-12 IJ) Inject 1,000 mg as directed every 28 (twenty-eight) days.    . cyclobenzaprine (FLEXERIL) 10 MG tablet One half tab PO qHS, then increase gradually to one tab TID. (Patient taking differently: as needed. One half tab PO qHS, then increase gradually to one tab TID.) 30 tablet 0  . donepezil (ARICEPT) 5 MG tablet Take 1 tablet (5 mg total) by mouth at bedtime. 90 tablet 3  . fesoterodine (TOVIAZ) 8 MG TB24 tablet Take 8 mg by mouth daily.    . furosemide (LASIX) 20 MG tablet Take 1 tablet (20 mg total) by mouth daily. 90 tablet 3  . gabapentin (NEURONTIN) 300 MG capsule TAKE 1 TO 2 CAPSULES BY MOUTH UP TO TWO TIMES DAILY AS NEEDED 180 capsule 3  . ipratropium (ATROVENT) 0.03 % nasal spray Place 2 sprays into both nostrils every 12 (twelve) hours as needed for rhinitis.    Marland Kitchen leflunomide (ARAVA) 20 MG tablet TAKE 1 TABLET EVERY DAY 90 tablet 0  . Melatonin 5 MG TABS Take 5 mg by mouth at bedtime as needed.    . memantine (NAMENDA) 10 MG tablet Take 1 tablet (10 mg total) by mouth 2 (two) times daily. 180 tablet 4  . metFORMIN (GLUCOPHAGE-XR) 500 MG 24 hr tablet Take 1 tablet (500 mg total) by mouth daily with breakfast. 90 tablet 1  . NON FORMULARY  Place into the nose at bedtime. cpap machine    . ondansetron (ZOFRAN-ODT) 4 MG disintegrating tablet Take 4 mg by  mouth every 6 (six) hours as needed for nausea or vomiting.    . ONE TOUCH ULTRA TEST test strip USE TO TEST BLOOD SUGAR ONCE DAILY 100 each 0  . pantoprazole (PROTONIX) 40 MG tablet Take 40 mg by mouth 2 (two) times daily.    . primidone (MYSOLINE) 50 MG tablet TAKE 1 TABLET AT BEDTIME 90 tablet 0  . Probiotic Product (PROBIOTIC ADVANCED PO) Take 1 capsule by mouth daily.    . propranolol (INDERAL) 10 MG tablet Take 1 tablet (10 mg total) by mouth 4 (four) times daily as needed. 60 tablet 6  . SUMAtriptan (IMITREX) 50 MG tablet Take 1 tablet (50 mg total) by mouth every 2 (two) hours as needed. 9 tablet 6  . venlafaxine XR (EFFEXOR-XR) 75 MG 24 hr capsule TAKE 1 CAPSULE BY MOUTH  DAILY WITH BREAKFAST 90 capsule 3  . vitamin D, CHOLECALCIFEROL, 400 UNITS tablet Take 400 Units by mouth daily.     . metoprolol (LOPRESSOR) 50 MG tablet Take 1.5 tablets (75 mg total) by mouth 2 (two) times daily. 270 tablet 3   Current Facility-Administered Medications  Medication Dose Route Frequency Provider Last Rate Last Dose  . cyanocobalamin ((VITAMIN B-12)) injection 1,000 mcg  1,000 mcg Intramuscular Q30 days Mia Marry, MD   1,000 mcg at 06/02/17 9518    Allergies:   Myrbetriq  [mirabegron]; Percocet [oxycodone-acetaminophen]; Celebrex [celecoxib]; Codeine; Darvocet [propoxyphene n-acetaminophen]; Erythromycin; Hydrocodone; Lyrica [pregabalin]; Macrodantin [nitrofurantoin]; Toradol [ketorolac tromethamine]; Tramadol; and Verapamil    Social History:  The patient  reports that she has never smoked. She has never used smokeless tobacco. She reports that she does not drink alcohol or use drugs.   Family History:  The patient's family history includes Asthma in her father, mother, sister, and sister; Breast cancer in her maternal grandmother; Colon polyps in her mother; Dementia in her  sister; Diabetes in her mother; Heart attack in her father and mother; Heart disease in her father and mother; Hypertension in her sister; Lupus in her sister; Pancreatic cancer in her maternal grandfather; Rheum arthritis in her sister; Stroke in her mother and sister; Wilson's disease in her maternal grandmother.    ROS:  Please see the history of present illness.    Review of Systems: Constitutional:  denies fever, chills, diaphoresis, appetite change and fatigue.  HEENT: denies photophobia, eye pain, redness, hearing loss, ear pain, congestion, sore throat, rhinorrhea, sneezing, neck pain, neck stiffness and tinnitus.  Respiratory: denies SOB, DOE, cough, chest tightness, and wheezing.  Cardiovascular: denies chest pain, palpitations and leg swelling.  Gastrointestinal: denies nausea, vomiting, abdominal pain, diarrhea, constipation, blood in stool.  Genitourinary: denies dysuria, urgency, frequency, hematuria, flank pain and difficulty urinating.  Musculoskeletal: denies  myalgias, back pain, joint swelling, arthralgias and gait problem.   Skin: denies pallor, rash and wound.  Neurological: denies dizziness, seizures, syncope, weakness, light-headedness, numbness and headaches.   Hematological: denies adenopathy, easy bruising, personal or family bleeding history.  Psychiatric/ Behavioral: denies suicidal ideation, mood changes, confusion, nervousness, sleep disturbance and agitation.       All other systems are reviewed and negative.    PHYSICAL EXAM: VS:  BP 126/84   Pulse 74   Ht 5\' 6"  (1.676 m)   Wt 180 lb 1.9 oz (81.7 kg)   BMI 29.07 kg/m  , BMI Body mass index is 29.07 kg/m. GEN: Well nourished, well developed, in no acute distress  HEENT: normal  Neck: no JVD, carotid bruits, or masses Cardiac:  RRR; no murmurs, rubs, or gallops,no edema  Respiratory:  clear to auscultation bilaterally, normal work of breathing GI: soft, nontender, nondistended, + BS MS: no deformity  or atrophy  Skin: warm and dry, no rash Neuro:  Strength and sensation are intact Psych: normal   EKG:  EKG is not ordered today.      Recent Labs: 03/29/2017: TSH 1.64 06/04/2017: ALT 24; BUN 13; Creat 0.93; Hemoglobin 13.7; Platelets 329; Potassium 4.6; Sodium 138    Lipid Panel    Component Value Date/Time   CHOL 133 08/28/2016 1558   TRIG 110 08/28/2016 1558   HDL 41 (L) 08/28/2016 1558   CHOLHDL 3.2 08/28/2016 1558   VLDL 22 08/28/2016 1558   LDLCALC 70 08/28/2016 1558      Wt Readings from Last 3 Encounters:  06/18/17 180 lb 1.9 oz (81.7 kg)  06/02/17 180 lb (81.6 kg)  05/20/17 179 lb 6.4 oz (81.4 kg)      Other studies Reviewed: Additional studies/ records that were reviewed today include: . Review of the above records demonstrates:    ASSESSMENT AND PLAN:  1. SVT -     3. Diabetes Mellitus  4. Minimal CAD by cath 2006 Mia [name])  5. Sleep apnea - CPAP is currently not working  6. Dyspnea on exertion:   she continues to have dyspnea on exertion. Her echocardiogram and her Myoview study were normal.  She was found atelectasis by CT scan. I've encouraged her to take deep breaths. She may need to have an appointment with pulmonologist.  Current medicines are reviewed at length with the patient today.  The patient does not have concerns regarding medicines.  The following changes have been made:  no change  Disposition:   FU with me in 6  months     Mia Moores, MD  06/18/2017 9:31 AM    [facility] [address], [place], [place]  [address] Phone: [phone]; Fax: [phone]

## 2017-06-21 ENCOUNTER — Telehealth: Payer: Self-pay | Admitting: Nurse Practitioner

## 2017-06-21 ENCOUNTER — Telehealth: Payer: Self-pay

## 2017-06-21 MED ORDER — ATORVASTATIN CALCIUM 40 MG PO TABS: 40.0000 mg | ORAL_TABLET | Freq: Every day | ORAL | 3 refills | Status: DC

## 2017-06-21 NOTE — Telephone Encounter (Signed)
-----   Message from Thayer Headings, MD sent at 06/20/2017  9:22 PM EDT ----- Overall, labs are stabel Trigs are higher, LDL is lower Continue current meds

## 2017-06-21 NOTE — Telephone Encounter (Signed)
Spoke with patient regarding her recent lab work results. Patient verbalized understanding to continue her current medications.

## 2017-06-21 NOTE — Telephone Encounter (Signed)
Patient requests refill of atorvastatin to [facility]. She verbalized understanding of lab results and thanked me for the call.

## 2017-07-05 ENCOUNTER — Ambulatory Visit (INDEPENDENT_AMBULATORY_CARE_PROVIDER_SITE_OTHER): Payer: Medicare Other | Admitting: Family Medicine

## 2017-07-05 ENCOUNTER — Ambulatory Visit (INDEPENDENT_AMBULATORY_CARE_PROVIDER_SITE_OTHER): Payer: Medicare Other

## 2017-07-05 ENCOUNTER — Encounter: Payer: Self-pay | Admitting: Family Medicine

## 2017-07-05 VITALS — BP 127/67 | HR 74 | Temp 98.4°F | Ht 66.0 in | Wt 177.0 lb

## 2017-07-05 DIAGNOSIS — E538 Deficiency of other specified B group vitamins: Secondary | ICD-10-CM

## 2017-07-05 DIAGNOSIS — I10 Essential (primary) hypertension: Secondary | ICD-10-CM

## 2017-07-05 DIAGNOSIS — R0602 Shortness of breath: Secondary | ICD-10-CM

## 2017-07-05 DIAGNOSIS — M810 Age-related osteoporosis without current pathological fracture: Secondary | ICD-10-CM | POA: Diagnosis not present

## 2017-07-05 DIAGNOSIS — I471 Supraventricular tachycardia: Secondary | ICD-10-CM

## 2017-07-05 DIAGNOSIS — R7301 Impaired fasting glucose: Secondary | ICD-10-CM | POA: Diagnosis not present

## 2017-07-05 DIAGNOSIS — R5383 Other fatigue: Secondary | ICD-10-CM | POA: Diagnosis not present

## 2017-07-05 DIAGNOSIS — Z23 Encounter for immunization: Secondary | ICD-10-CM | POA: Diagnosis not present

## 2017-07-05 LAB — POCT GLYCOSYLATED HEMOGLOBIN (HGB A1C): Hemoglobin A1C: 5.8

## 2017-07-05 MED ORDER — CYANOCOBALAMIN 1000 MCG/ML IJ SOLN
1000.0000 ug | Freq: Once | INTRAMUSCULAR | Status: AC
  Administered 2017-07-05: 1000 ug via INTRAMUSCULAR

## 2017-07-05 NOTE — Progress Notes (Signed)
Pt here for monthly B12 injection. She tolerated injection well, it was given in [place]. Pt to RTC in one month for next injection.Mia [name], [name]

## 2017-07-05 NOTE — Progress Notes (Signed)
Subjective:    Patient ID: Mia [name], female    DOB: 05-02-1951, 66 y.o.   MRN: [id number]  HPI  Hypertension- Pt denies chest pain, SOB, dizziness, or heart palpitations.  Taking meds as directed w/o problems.  Denies medication side effects.    Impaired fasting glucose-no increased thirst or urination. No symptoms consistent with hypoglycemia.  Pernicious anemia-due for B12 injection  SVT-she does occasionally get some episodes with her heart. She is followed every 6 months by cardiology. Most the time she says that they are brief.  She is doing much better with her tremor. The primidone at bedtime seems to be helping. She's noticed that she's not getting as much of the head bobbing. She'll still occasionally get a little bit more intense tremor at times in her hands but overall she feels like it's working well.  Osteoporosis-currently on Fosamax. But she often forgets to take it and sometimes is not convenient to take it though she hasn't been taking it regularly she wanted to know if there are alternative forms that we could consider.  She is seeing [name] for her memory concerns. He added Namenda to her Aricept. He also addresses her gait issues. She also sometime gets pain in her legs.    She's also been visiting her sister-in-law on the hospital and says that when she's walkin the long halls she'll get very short of breath and sometimes feel like she has to use an inhaler. That she's not sure if it really helped very much. 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  Review of Systems  BP 127/67   Pulse 74   Temp 98.4 F (36.9 C)   Ht 5\' 6"  (1.676 m)   Wt 177 lb (80.3 kg)   SpO2 96%   BMI 28.57 kg/m     Allergies  Allergen Reactions  . Myrbetriq  [Mirabegron] Other (See Comments)    SEVERE HEADACHE  . Percocet [Oxycodone-Acetaminophen] Nausea And Vomiting  . Celebrex [Celecoxib] Other (See Comments)    Other  reaction(s): Other flushed  . Codeine Nausea And Vomiting  . Darvocet [Propoxyphene N-Acetaminophen] Nausea And Vomiting  . Erythromycin Nausea And Vomiting    Nausea and vomiting   . Hydrocodone Nausea And Vomiting  . Lyrica [Pregabalin] Swelling    Legs swelling   . Macrodantin [Nitrofurantoin] Nausea And Vomiting  . Toradol [Ketorolac Tromethamine] Nausea And Vomiting    Per patient, only PO form causes nausea and vomiting. Can take injection without issue  . Tramadol Nausea And Vomiting  . Verapamil Nausea And Vomiting    REACTION: intolerance    Past Medical History:  Diagnosis Date  . Anal fissure   . Anemia   . Arthritis   . Blood transfusion   . C. difficile colitis   . Chest pain    a. 01/2013 MV: EF 59%, no ischemia.  . Chronic Dyspnea    a. 01/2013 Echo: EF 60-65%, Gr 1 DD, PASP 61mmHg.  . Diabetes mellitus   . Fibromyalgia   . Gall stones   . GERD (gastroesophageal reflux disease)    gastritis  . Hemorrhoids   . Hypertension   . Interstitial cystitis   . Memory difficulty 12/14/2013  . Neuromuscular disorder ([place])    sclerosis  . Osteoporosis   . Peptic ulcer   . PONV (postoperative nausea and vomiting)   . Pseudogout   . Raynaud phenomenon   .  Rectal bleeding   . Sleep apnea    a. on cpap.  Marland Kitchen SVT (supraventricular tachycardia) ([place])   . Syncope 09/10/2015  . Thyroid disease    hypothyroidism  . Tremor, essential 05/14/2016    Past Surgical History:  Procedure Laterality Date  . APPENDECTOMY    . BLADDER SURGERY     x2  . BLADDER SURGERY    . BREAST BIOPSY    . CARDIAC CATHETERIZATION    . CATARACT EXTRACTION Bilateral   . CHOLECYSTECTOMY    . DILATION AND CURETTAGE OF UTERUS    . ENTEROCELE REPAIR     x2  . EYE SURGERY     retina  . hysterectomy - unknown type    . KNEE SURGERY     x6  . NECK SURGERY     fusion  . OOPHORECTOMY    . QUADRICEPS REPAIR Right   . RECTOCELE REPAIR     x2  . SHOULDER ARTHROSCOPY WITH SUBACROMIAL  DECOMPRESSION, ROTATOR CUFF REPAIR AND BICEP TENDON REPAIR  10/06/2012   Procedure: SHOULDER ARTHROSCOPY WITH SUBACROMIAL DECOMPRESSION, ROTATOR CUFF REPAIR AND BICEP TENDON REPAIR;  Surgeon: Nita Sells, MD;  Location: [facility];  Service: Orthopedics;  Laterality: Right;  Arthroscopic  Repair  of  Subscapularis, Open Biceps Tenodesis  . SHOULDER SURGERY     bilateral- bones spur  . TONSILLECTOMY    . TOTAL KNEE ARTHROPLASTY      Social History   Social History  . Marital status: Married    Spouse name: N/A  . Number of children: 2  . Years of education: 14   Occupational History  . Retired    Social History Main Topics  . Smoking status: Never Smoker  . Smokeless tobacco: Never Used  . Alcohol use No  . Drug use: No  . Sexual activity: Not on file   Other Topics Concern  . Not on file   Social History Narrative   Patient drinks about 3-4 cups of caffeine daily.   Patient is right handed.    Family History  Problem Relation Age of Onset  . Heart attack Mother   . Stroke Mother   . Diabetes Mother   . Heart disease Mother   . Colon polyps Mother   . Asthma Mother   . Breast cancer Maternal Grandmother   . Wilson's disease Maternal Grandmother   . Pancreatic cancer Maternal Grandfather   . Heart disease Father   . Heart attack Father   . Asthma Father   . Stroke Sister   . Hypertension Sister   . Rheum arthritis Sister   . Dementia Sister   . Asthma Sister   . Lupus Sister   . Asthma Sister     Outpatient Encounter Prescriptions as of 07/05/2017  Medication Sig  . alendronate (FOSAMAX) 70 MG tablet Take 1 tablet by mouth  every 7 days with a full  glass of water on an empty  stomach  . AMBULATORY NON FORMULARY MEDICATION Medication Name: One touch ultra strips. Check fasting blood sugar in the morning and as needed.  Dx - [id number] Fax to [phone]  . atorvastatin (LIPITOR) 40 MG tablet Take 1 tablet (40 mg total) by mouth daily.   . calcium carbonate (OS-CAL) 600 MG TABS Take 600 mg by mouth daily with breakfast.   . cetirizine (ZYRTEC) 10 MG tablet Take 10 mg by mouth as needed for allergies.  . Cyanocobalamin (VITAMIN B-12 IJ) Inject 1,000 mg as  directed every 28 (twenty-eight) days.  . cyclobenzaprine (FLEXERIL) 10 MG tablet One half tab PO qHS, then increase gradually to one tab TID. (Patient taking differently: as needed. One half tab PO qHS, then increase gradually to one tab TID.)  . donepezil (ARICEPT) 5 MG tablet Take 1 tablet (5 mg total) by mouth at bedtime.  . fesoterodine (TOVIAZ) 8 MG TB24 tablet Take 8 mg by mouth daily.  . furosemide (LASIX) 20 MG tablet Take 1 tablet (20 mg total) by mouth daily.  Marland Kitchen gabapentin (NEURONTIN) 300 MG capsule TAKE 1 TO 2 CAPSULES BY MOUTH UP TO TWO TIMES DAILY AS NEEDED  . ipratropium (ATROVENT) 0.03 % nasal spray Place 2 sprays into both nostrils every 12 (twelve) hours as needed for rhinitis.  Marland Kitchen leflunomide (ARAVA) 20 MG tablet TAKE 1 TABLET EVERY DAY  . Melatonin 5 MG TABS Take 5 mg by mouth at bedtime as needed.  . memantine (NAMENDA) 10 MG tablet Take 1 tablet (10 mg total) by mouth 2 (two) times daily.  . metFORMIN (GLUCOPHAGE-XR) 500 MG 24 hr tablet Take 1 tablet (500 mg total) by mouth daily with breakfast.  . NON FORMULARY Place into the nose at bedtime. cpap machine  . ondansetron (ZOFRAN-ODT) 4 MG disintegrating tablet Take 4 mg by mouth every 6 (six) hours as needed for nausea or vomiting.  . ONE TOUCH ULTRA TEST test strip USE TO TEST BLOOD SUGAR ONCE DAILY  . pantoprazole (PROTONIX) 40 MG tablet Take 40 mg by mouth 2 (two) times daily.  . primidone (MYSOLINE) 50 MG tablet TAKE 1 TABLET AT BEDTIME  . Probiotic Product (PROBIOTIC ADVANCED PO) Take 1 capsule by mouth daily.  . propranolol (INDERAL) 10 MG tablet Take 1 tablet (10 mg total) by mouth 4 (four) times daily as needed.  . SUMAtriptan (IMITREX) 50 MG tablet Take 1 tablet (50 mg total) by mouth every 2 (two)  hours as needed.  . venlafaxine XR (EFFEXOR-XR) 75 MG 24 hr capsule TAKE 1 CAPSULE BY MOUTH  DAILY WITH BREAKFAST  . vitamin D, CHOLECALCIFEROL, 400 UNITS tablet Take 400 Units by mouth daily.   . metoprolol (LOPRESSOR) 50 MG tablet Take 1.5 tablets (75 mg total) by mouth 2 (two) times daily.   Facility-Administered Encounter Medications as of 07/05/2017  Medication  . cyanocobalamin ((VITAMIN B-12)) injection 1,000 mcg  . [COMPLETED] cyanocobalamin ((VITAMIN B-12)) injection 1,000 mcg         Objective:   Physical Exam  Constitutional: She is oriented to person, place, and time. She appears well-developed and well-nourished.  HENT:  Head: Normocephalic and atraumatic.  Neck: Neck supple. No thyromegaly present.  Cardiovascular: Normal rate, regular rhythm and normal heart sounds.   Pulmonary/Chest: Effort normal and breath sounds normal.  Lymphadenopathy:    She has no cervical adenopathy.  Neurological: She is alert and oriented to person, place, and time.  Skin: Skin is warm and dry.  Psychiatric: She has a normal mood and affect. Her behavior is normal.         Assessment & Plan:  Pernicious anemia- B12 injection given today.   HTN - Well controlled. Continue current regimen. Follow up in  6 months.   IFG - well-controlled. Hemoglobin 5.8 today which is up just a little bit from previous.  She plans on cutting back on her carbs.   SVT-follows with cardiology every 6 months.  Tremor-improved with primidone.  Osteoporosis-consider switching to pearly a. We'll need to check to see if her insurance  will cover it.  SOB - she did drop below 88% on her 6 minute walk test. We'll schedule her for chest x-ray and for spirometry.  Eval for COPD vs restrictive disease.

## 2017-07-06 ENCOUNTER — Other Ambulatory Visit: Payer: Self-pay | Admitting: Family Medicine

## 2017-07-06 ENCOUNTER — Encounter (INDEPENDENT_AMBULATORY_CARE_PROVIDER_SITE_OTHER): Payer: Medicare Other | Admitting: Podiatry

## 2017-07-06 NOTE — Progress Notes (Signed)
This encounter was created in error - please disregard.

## 2017-07-08 DIAGNOSIS — N3941 Urge incontinence: Secondary | ICD-10-CM | POA: Diagnosis not present

## 2017-07-25 NOTE — Progress Notes (Signed)
Office Visit Note  Patient: Mia [name]             Date of Birth: [birth year]           MRN: [id number]             PCP: Hali Marry, MD Referring: Hali Marry, * Visit Date: 08/02/2017 Occupation: @GUAROCC @    Subjective:  Osteoarthritis (Bil hand pain, hands falling asleep every night, difficulty sleeping)   History of Present Illness: Mia [name] is a 66 y.o. 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 She states she's been having pain and tingling sensation in her bilateral hands. She's been also experiencing nocturnal pain in her hands. The pain sometimes goes to her forearms. She continues to have some knee joint discomfort. She states she has intermittent swelling in her bilateral knee joints. Her right knee joint has been replaced. She continues to have a stiffness in her cervical, thoracic and lumbar spine. She has not had a flare of pseudogout. She does have colchicine which she takes only on when necessary basis. She's noticed some improvement on Arava.  Activities of Daily Living:  Patient reports morning stiffness for 30 minutes.   Patient Reports nocturnal pain.  Difficulty dressing/grooming: Denies Difficulty climbing stairs: Reports Difficulty getting out of chair: Reports Difficulty using hands for taps, buttons, cutlery, and/or writing: Denies   Review of Systems  Constitutional: Positive for fatigue and weight gain. Negative for night sweats and weakness.  HENT: Positive for mouth dryness.   Eyes: Negative for dryness.  Respiratory: Negative for difficulty breathing.   Cardiovascular: Negative for swelling in legs/feet.  Gastrointestinal: Negative for constipation.  Endocrine: Negative for excessive thirst.  Genitourinary: Negative for painful urination.  Musculoskeletal: Positive for arthralgias, joint pain, joint swelling, morning stiffness and muscle tenderness. Negative for gait problem and  muscle weakness.  Skin: Negative for rash.  Neurological: Positive for numbness. Negative for night sweats.  Hematological: Negative for bruising/bleeding tendency.  Psychiatric/Behavioral: Negative for sleep disturbance.    PMFS History:  Patient Active Problem List   Diagnosis Date Noted  . Restrictive lung disease 07/30/2017  . Mixed hyperlipidemia 06/18/2017  . Osteoporosis 04/18/2017  . Trochanteric bursitis of both hips 02/24/2017  . Stress fracture of left tibia 11/05/2016  . Inflammatory arthritis 09/30/2016  . High risk medication use 09/30/2016  . History of Clostridium difficile colitis 09/30/2016  . 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 Pseudogout 09/30/2016  . DDD cervical spine status post fusion 09/30/2016  . DDD thoracic spine 09/30/2016  . H/O total knee replacement, right 09/30/2016  . Age-related osteoporosis without current pathological fracture 09/30/2016  . Tremor, essential 05/14/2016  . Right foot pain 04/14/2016  . B12 deficiency 09/10/2015  . Syncope 09/10/2015  . Cervical facet joint syndrome 09/10/2015  . Chronic fatigue 09/10/2015  . Aortic atherosclerosis ([place]) 04/01/2015  . Primary osteoarthritis of left knee 02/27/2015  . Chronic Dyspnea   . DOE (dyspnea on exertion)   . Benign head tremor 06/11/2014  . Lumbar degenerative disc disease 06/11/2014  . IFG (impaired fasting glucose) 03/09/2014  . Osteopenia 03/09/2014  . Hemorrhoid 03/09/2014  . Retinal wrinkling, right eye 03/09/2014  . Fibromyalgia 03/09/2014  . GERD (gastroesophageal reflux disease) 03/09/2014  . History of arthroplasty of right knee 01/31/2014  . Memory difficulty 12/14/2013  . Hemorrhoids, external, thrombosed 06/22/2011  . Hypertension 03/11/2011  . SVT (supraventricular tachycardia) ([place])   . Raynaud phenomenon   .  EDEMA 08/25/2010  . Nonspecific (abnormal) findings on radiological and other examination of body structure 08/25/2010  . COMPUTERIZED  TOMOGRAPHY, CHEST, ABNORMAL 08/25/2010  . OSA (obstructive sleep apnea) 04/24/2009  . 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 Allergic rhinitis 06/11/2008  . Dyspnea 06/11/2008  . 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 Chronic diastolic CHF (congestive heart failure) ([place]) 06/08/2008  . INTERSTITIAL CYSTITIS 06/08/2008    Past Medical History:  Diagnosis Date  . Anal fissure   . Anemia   . Arthritis   . Blood transfusion   . C. difficile colitis   . Chest pain    a. 01/2013 MV: EF 59%, no ischemia.  . Chronic Dyspnea    a. 01/2013 Echo: EF 60-65%, Gr 1 DD, PASP 7mmHg.  . Diabetes mellitus   . Fibromyalgia   . Gall stones   . GERD (gastroesophageal reflux disease)    gastritis  . Hemorrhoids   . Hypertension   . Interstitial cystitis   . Memory difficulty 12/14/2013  . Neuromuscular disorder ([place])    sclerosis  . Osteoporosis   . Peptic ulcer   . PONV (postoperative nausea and vomiting)   . Pseudogout   . Raynaud phenomenon   . Rectal bleeding   . Sleep apnea    a. on cpap.  Marland Kitchen SVT (supraventricular tachycardia) ([address])   . Syncope 09/10/2015  . Thyroid disease    hypothyroidism  . Tremor, essential 05/14/2016    Family History  Problem Relation Age of Onset  . Heart attack Mother   . Stroke Mother   . Diabetes Mother   . Heart disease Mother   . Colon polyps Mother   . Asthma Mother   . Breast cancer Maternal Grandmother   . Wilson's disease Maternal Grandmother   . Pancreatic cancer Maternal Grandfather   . Heart disease Father   . Heart attack Father   . Asthma Father   . Stroke Sister   . Hypertension Sister   . Rheum arthritis Sister   . Dementia Sister   . Asthma Sister   . Lupus Sister   . Asthma Sister    Past Surgical History:  Procedure Laterality Date  . APPENDECTOMY    . BLADDER SURGERY     x2  . BLADDER SURGERY    . BREAST BIOPSY    . CARDIAC CATHETERIZATION    . CATARACT EXTRACTION Bilateral   . CHOLECYSTECTOMY    .  DILATION AND CURETTAGE OF UTERUS    . ENTEROCELE REPAIR     x2  . EYE SURGERY     retina  . hysterectomy - unknown type    . KNEE ARTHROPLASTY    . KNEE SURGERY     x6  . NECK SURGERY     fusion  . OOPHORECTOMY    . QUADRICEPS REPAIR Right   . RECTOCELE REPAIR     x2  . SHOULDER ARTHROSCOPY WITH SUBACROMIAL DECOMPRESSION, ROTATOR CUFF REPAIR AND BICEP TENDON REPAIR  10/06/2012   Procedure: SHOULDER ARTHROSCOPY WITH SUBACROMIAL DECOMPRESSION, ROTATOR CUFF REPAIR AND BICEP TENDON REPAIR;  Surgeon: Nita Sells, MD;  Location: [facility];  Service: Orthopedics;  Laterality: Right;  Arthroscopic  Repair  of  Subscapularis, Open Biceps Tenodesis  . SHOULDER SURGERY     bilateral- bones spur  . TONSILLECTOMY    . TOTAL KNEE ARTHROPLASTY    . TOTAL SHOULDER ARTHROPLASTY     Social History   Social History Narrative   Patient drinks  about 3-4 cups of caffeine daily.   Patient is right handed.     Objective: Vital Signs: BP 116/65 (BP Location: Left Arm, Patient Position: Sitting, Cuff Size: Normal)   Pulse 73   Resp 16   Ht 5\' 6"  (1.676 m)   Wt 181 lb (82.1 kg)   BMI 29.21 kg/m    Physical Exam  Constitutional: She is oriented to person, place, and time. She appears well-developed and well-nourished.  HENT:  Head: Normocephalic and atraumatic.  Eyes: Conjunctivae and EOM are normal.  Neck: Normal range of motion.  Cardiovascular: Normal rate, regular rhythm, normal heart sounds and intact distal pulses.   Pulmonary/Chest: Effort normal and breath sounds normal.  Abdominal: Soft. Bowel sounds are normal.  Lymphadenopathy:    She has no cervical adenopathy.  Neurological: She is alert and oriented to person, place, and time.  Skin: Skin is warm and dry. Capillary refill takes less than 2 seconds.  Psychiatric: She has a normal mood and affect. Her behavior is normal.  Nursing note and vitals reviewed.    Musculoskeletal Exam: C-spine and  thoracic and lumbar spine limited range of motion. She has some discomfort range of motion of her shoulder joints. She has incomplete extension of her right elbow. She has DIP PIP thickening in her hands. No synovitis was noted over her MCPs or PIP joints. Hip joints are good range of motion. Her right knee joint is replaced with some warmth and effusion. Left knee joint had no warmth or swelling. Ankle joints MTPs PIPs with good range of motion with no active synovitis. She has some generalized hyperalgesia.  CDAI Exam: CDAI Homunculus Exam:   Joint Counts:  CDAI Tender Joint count: 0 CDAI Swollen Joint count: 0  Global Assessments:  Patient Global Assessment: 6 Provider Global Assessment: 3  CDAI Calculated Score: 9    Investigation: No additional findings. CBC Latest Ref Rng & Units 06/04/2017 03/01/2017 12/03/2016  WBC 3.8 - 10.8 K/uL 7.5 7.4 7.0  Hemoglobin 11.7 - 15.5 g/dL 13.7 13.4 13.3  Hematocrit 35.0 - 45.0 % 42.2 41.2 41.2  Platelets 140 - 400 K/uL 329 433(H) 362   CMP Latest Ref Rng & Units 06/04/2017 03/01/2017 12/03/2016  Glucose 65 - 99 mg/dL 126(H) 104(H) 87  BUN 7 - 25 mg/dL 13 9 12   Creatinine 0.50 - 0.99 mg/dL 0.93 0.83 0.79  Sodium 135 - 146 mmol/L 138 140 138  Potassium 3.5 - 5.3 mmol/L 4.6 4.4 4.2  Chloride 98 - 110 mmol/L 100 102 101  CO2 20 - 32 mmol/L 26 30 29   Calcium 8.6 - 10.4 mg/dL 9.8 9.9 9.6  Total Protein 6.1 - 8.1 g/dL 6.7 6.6 6.9  Total Bilirubin 0.2 - 1.2 mg/dL 0.3 0.3 0.4  Alkaline Phos 33 - 130 U/L 85 85 79  AST 10 - 35 U/L 23 25 19   ALT 6 - 29 U/L 24 23 18     Imaging: Dg Chest 2 View  Result Date: 07/05/2017 CLINICAL DATA:  66 y/o F; 1 month of shortness of breath with exertion and fatigue. EXAM: CHEST  2 VIEW COMPARISON:  09/09/2015 chest radiograph FINDINGS: Normal cardiac silhouette. Aortic atherosclerosis with calcification. Clear lungs. No pleural effusion or pneumothorax. No acute osseous abnormality is evident. Anterior cervical fusion  hardware noted. Right upper quadrant cholecystectomy clips. IMPRESSION: No active cardiopulmonary disease. Electronically Signed   By: Kristine Garbe M.D.   On: 07/05/2017 14:30    Speciality Comments: No specialty comments available.  Procedures:  No procedures performed Allergies: Myrbetriq  [mirabegron]; Percocet [oxycodone-acetaminophen]; Celebrex [celecoxib]; Codeine; Darvocet [propoxyphene n-acetaminophen]; Erythromycin; Hydrocodone; Lyrica [pregabalin]; Macrodantin [nitrofurantoin]; Toradol [ketorolac tromethamine]; Tramadol; and Verapamil   Assessment / Plan:     Visit Diagnoses: Seronegative rheumatoid arthritis [facility]): Patient is clinically doing well with no synovitis on examination. She complains of some discomfort in the left [facility] joint which I believe is due to underlying osteoarthritis.  Paresthesia in bilateral hands: I will schedule nerve conduction velocity to rule out carpal tunnel syndrome.  High risk medication use - Arava 20 mg by mouth daily. Her labs are stable we'll continue to monitor her labs every 3 months.  Primary osteoarthritis of left knee: Some discomfort. I offered physical therapy referral for lower extremity muscle strengthening which she declined.  Pseudogout: She has not had any flares. She does have colchicine at home which she can take on when necessary basis.  History of total right knee replacement: Continues to have problems with the discomfort in her right knee. She has lost some of the muscle strength in her right lower extremity.   DDD (degenerative disc disease), cervical - status post fusion: She has limited range of motion with some stiffness  DDD (degenerative disc disease), thoracic: Chronic pain  DDD (degenerative disc disease), lumbar: Complains of chronic pain  Osteoporosis, unspecified osteoporosis type, unspecified pathological fracture presence -  Followed up by PCP. She is switching from Fosamax to Prolia according to  patient.  Her other medical problems are listed as follows:  IC (interstitial cystitis)  Chronic fatigue  Fibromyalgia  History of dyspnea  History of CHF (congestive heart failure)  History of hypertension  Primary lateral sclerosis (HCC)  History of gastroesophageal reflux (GERD)  History of Clostridium difficile colitis    Orders: No orders of the defined types were placed in this encounter.  No orders of the defined types were placed in this encounter.   Face-to-face time spent with patient was 30 minutes. 50% of time was spent in counseling and coordination of care.  Follow-Up Instructions: Return for Rheumatoid arthritis, Osteoarthritis,CPPD.   Bo Merino, MD  Note - This record has been created using Editor, commissioning.  Chart creation errors have been sought, but may not always  have been located. Such creation errors do not reflect on  the standard of medical care.

## 2017-07-29 DIAGNOSIS — N3941 Urge incontinence: Secondary | ICD-10-CM | POA: Diagnosis not present

## 2017-07-29 DIAGNOSIS — R350 Frequency of micturition: Secondary | ICD-10-CM | POA: Diagnosis not present

## 2017-07-30 ENCOUNTER — Telehealth: Payer: Self-pay | Admitting: Family Medicine

## 2017-07-30 ENCOUNTER — Ambulatory Visit (INDEPENDENT_AMBULATORY_CARE_PROVIDER_SITE_OTHER): Payer: Medicare Other | Admitting: Family Medicine

## 2017-07-30 VITALS — BP 120/63 | HR 72 | Ht 66.0 in | Wt 178.0 lb

## 2017-07-30 DIAGNOSIS — M810 Age-related osteoporosis without current pathological fracture: Secondary | ICD-10-CM | POA: Diagnosis not present

## 2017-07-30 DIAGNOSIS — E538 Deficiency of other specified B group vitamins: Secondary | ICD-10-CM

## 2017-07-30 DIAGNOSIS — R0602 Shortness of breath: Secondary | ICD-10-CM

## 2017-07-30 DIAGNOSIS — J984 Other disorders of lung: Secondary | ICD-10-CM

## 2017-07-30 MED ORDER — ALBUTEROL SULFATE (2.5 MG/3ML) 0.083% IN NEBU
2.5000 mg | INHALATION_SOLUTION | Freq: Once | RESPIRATORY_TRACT | Status: AC
  Administered 2017-07-30: 2.5 mg via RESPIRATORY_TRACT

## 2017-07-30 MED ORDER — CYANOCOBALAMIN 1000 MCG/ML IJ SOLN: 1000.0000 ug | Freq: Once | INTRAMUSCULAR | Status: DC

## 2017-07-30 NOTE — Progress Notes (Signed)
Subjective:    Patient ID: Mia [name], female    DOB: [birth year], 66 y.o.   MRN: [id number]  HPI She is here for spirometry today. She's had some intermittent shortness of breath where she feels like it's difficult to get a breath in. No distinct wheezing per se. She's also been told over the years that she has asthmatic bronchitis particularly when she gets ill. Years ago when she was diagnosed with primary lateral sclerosis she had significant deficit with her diaphragm and actually had have oxygen for a period of time.We did do a chest x-ray in September which was essentially normal except for some aortic atherosclerosis and calcification.  Osteoporosis-she's also asking about the Prolia. Return to check into whether or not she'll be a candidate to get it done here in the office. She has difficulty with the bisphosphonates causing reflux and would like to try the injection.   Study Result   CLINICAL DATA:  66 y/o F; 1 month of shortness of breath with exertion and fatigue.  EXAM: CHEST  2 VIEW  COMPARISON:  09/09/2015 chest radiograph  FINDINGS: Normal cardiac silhouette. Aortic atherosclerosis with calcification. Clear lungs. No pleural effusion or pneumothorax. No acute osseous abnormality is evident. Anterior cervical fusion hardware noted. Right upper quadrant cholecystectomy clips.  IMPRESSION: No active cardiopulmonary disease.   Electronically Signed   By: Kristine Garbe M.D.   On: 07/05/2017 14:30      Review of Systems  BP 120/63   Pulse 72   Ht 5\' 6"  (1.676 m)   Wt 178 lb (80.7 kg)   SpO2 98%   BMI 28.73 kg/m     Allergies  Allergen Reactions  . Myrbetriq  [Mirabegron] Other (See Comments)    SEVERE HEADACHE  . Percocet [Oxycodone-Acetaminophen] Nausea And Vomiting  . Celebrex [Celecoxib] Other (See Comments)    Other reaction(s): Other flushed  . Codeine Nausea And Vomiting  . Darvocet [Propoxyphene N-Acetaminophen] Nausea And  Vomiting  . Erythromycin Nausea And Vomiting    Nausea and vomiting   . Hydrocodone Nausea And Vomiting  . Lyrica [Pregabalin] Swelling    Legs swelling   . Macrodantin [Nitrofurantoin] Nausea And Vomiting  . Toradol [Ketorolac Tromethamine] Nausea And Vomiting    Per patient, only PO form causes nausea and vomiting. Can take injection without issue  . Tramadol Nausea And Vomiting  . Verapamil Nausea And Vomiting    REACTION: intolerance    Past Medical History:  Diagnosis Date  . Anal fissure   . Anemia   . Arthritis   . Blood transfusion   . C. difficile colitis   . Chest pain    a. 01/2013 MV: EF 59%, no ischemia.  . Chronic Dyspnea    a. 01/2013 Echo: EF 60-65%, Gr 1 DD, PASP 51mmHg.  . Diabetes mellitus   . Fibromyalgia   . Gall stones   . GERD (gastroesophageal reflux disease)    gastritis  . Hemorrhoids   . Hypertension   . Interstitial cystitis   . Memory difficulty 12/14/2013  . Neuromuscular disorder ([place])    sclerosis  . Osteoporosis   . Peptic ulcer   . PONV (postoperative nausea and vomiting)   . Pseudogout   . Raynaud phenomenon   . Rectal bleeding   . Sleep apnea    a. on cpap.  Marland Kitchen SVT (supraventricular tachycardia) ([place])   . Syncope 09/10/2015  . Thyroid disease    hypothyroidism  . Tremor, essential 05/14/2016  Past Surgical History:  Procedure Laterality Date  . APPENDECTOMY    . BLADDER SURGERY     x2  . BLADDER SURGERY    . BREAST BIOPSY    . CARDIAC CATHETERIZATION    . CATARACT EXTRACTION Bilateral   . CHOLECYSTECTOMY    . DILATION AND CURETTAGE OF UTERUS    . ENTEROCELE REPAIR     x2  . EYE SURGERY     retina  . hysterectomy - unknown type    . KNEE SURGERY     x6  . NECK SURGERY     fusion  . OOPHORECTOMY    . QUADRICEPS REPAIR Right   . RECTOCELE REPAIR     x2  . SHOULDER ARTHROSCOPY WITH SUBACROMIAL DECOMPRESSION, ROTATOR CUFF REPAIR AND BICEP TENDON REPAIR  10/06/2012   Procedure: SHOULDER ARTHROSCOPY WITH  SUBACROMIAL DECOMPRESSION, ROTATOR CUFF REPAIR AND BICEP TENDON REPAIR;  Surgeon: Nita Sells, MD;  Location: [place];  Service: Orthopedics;  Laterality: Right;  Arthroscopic  Repair  of  Subscapularis, Open Biceps Tenodesis  . SHOULDER SURGERY     bilateral- bones spur  . TONSILLECTOMY    . TOTAL KNEE ARTHROPLASTY      Social History   Social History  . Marital status: Married    Spouse name: N/A  . Number of children: 2  . Years of education: 14   Occupational History  . Retired    Social History Main Topics  . Smoking status: Never Smoker  . Smokeless tobacco: Never Used  . Alcohol use No  . Drug use: No  . Sexual activity: Not on file   Other Topics Concern  . Not on file   Social History Narrative   Patient drinks about 3-4 cups of caffeine daily.   Patient is right handed.    Family History  Problem Relation Age of Onset  . Heart attack Mother   . Stroke Mother   . Diabetes Mother   . Heart disease Mother   . Colon polyps Mother   . Asthma Mother   . Breast cancer Maternal Grandmother   . Wilson's disease Maternal Grandmother   . Pancreatic cancer Maternal Grandfather   . Heart disease Father   . Heart attack Father   . Asthma Father   . Stroke Sister   . Hypertension Sister   . Rheum arthritis Sister   . Dementia Sister   . Asthma Sister   . Lupus Sister   . Asthma Sister     Outpatient Encounter Prescriptions as of 07/30/2017  Medication Sig  . alendronate (FOSAMAX) 70 MG tablet Take 1 tablet by mouth  every 7 days with a full  glass of water on an empty  stomach  . AMBULATORY NON FORMULARY MEDICATION Medication Name: One touch ultra strips. Check fasting blood sugar in the morning and as needed.  Dx - [phone] Fax to [phone]  . atorvastatin (LIPITOR) 40 MG tablet Take 1 tablet (40 mg total) by mouth daily.  . calcium carbonate (OS-CAL) 600 MG TABS Take 600 mg by mouth daily with breakfast.   . cetirizine (ZYRTEC)  10 MG tablet Take 10 mg by mouth as needed for allergies.  . Cyanocobalamin (VITAMIN B-12 IJ) Inject 1,000 mg as directed every 28 (twenty-eight) days.  . cyclobenzaprine (FLEXERIL) 10 MG tablet One half tab PO qHS, then increase gradually to one tab TID. (Patient taking differently: as needed. One half tab PO qHS, then increase gradually to one tab  TID.)  . donepezil (ARICEPT) 5 MG tablet Take 1 tablet (5 mg total) by mouth at bedtime.  . fesoterodine (TOVIAZ) 8 MG TB24 tablet Take 8 mg by mouth daily.  . furosemide (LASIX) 20 MG tablet Take 1 tablet (20 mg total) by mouth daily.  Marland Kitchen gabapentin (NEURONTIN) 300 MG capsule TAKE 1 TO 2 CAPSULES BY MOUTH UP TO TWO TIMES DAILY AS NEEDED  . ipratropium (ATROVENT) 0.03 % nasal spray Place 2 sprays into both nostrils every 12 (twelve) hours as needed for rhinitis.  Marland Kitchen leflunomide (ARAVA) 20 MG tablet TAKE 1 TABLET EVERY DAY  . Melatonin 5 MG TABS Take 5 mg by mouth at bedtime as needed.  . memantine (NAMENDA) 10 MG tablet Take 1 tablet (10 mg total) by mouth 2 (two) times daily.  . metFORMIN (GLUCOPHAGE-XR) 500 MG 24 hr tablet Take 1 tablet (500 mg total) by mouth daily with breakfast.  . NON FORMULARY Place into the nose at bedtime. cpap machine  . ondansetron (ZOFRAN-ODT) 4 MG disintegrating tablet Take 4 mg by mouth every 6 (six) hours as needed for nausea or vomiting.  . ONE TOUCH ULTRA TEST test strip USE TO TEST BLOOD SUGAR ONCE DAILY  . pantoprazole (PROTONIX) 40 MG tablet Take 40 mg by mouth 2 (two) times daily.  . primidone (MYSOLINE) 50 MG tablet TAKE 1 TABLET AT BEDTIME  . Probiotic Product (PROBIOTIC ADVANCED PO) Take 1 capsule by mouth daily.  . propranolol (INDERAL) 10 MG tablet Take 1 tablet (10 mg total) by mouth 4 (four) times daily as needed.  . SUMAtriptan (IMITREX) 50 MG tablet Take 1 tablet (50 mg total) by mouth every 2 (two) hours as needed.  . venlafaxine XR (EFFEXOR-XR) 75 MG 24 hr capsule TAKE 1 CAPSULE BY MOUTH  DAILY WITH  BREAKFAST  . vitamin D, CHOLECALCIFEROL, 400 UNITS tablet Take 400 Units by mouth daily.   . metoprolol (LOPRESSOR) 50 MG tablet Take 1.5 tablets (75 mg total) by mouth 2 (two) times daily.   Facility-Administered Encounter Medications as of 07/30/2017  Medication  . [COMPLETED] albuterol (PROVENTIL) (2.5 MG/3ML) 0.083% nebulizer solution 2.5 mg  . cyanocobalamin ((VITAMIN B-12)) injection 1,000 mcg  . cyanocobalamin ((VITAMIN B-12)) injection 1,000 mcg          Objective:   Physical Exam  Constitutional: She is oriented to person, place, and time. She appears well-developed and well-nourished.  HENT:  Head: Normocephalic and atraumatic.  Eyes: Conjunctivae and EOM are normal.  Cardiovascular: Normal rate.   Pulmonary/Chest: Effort normal.  Neurological: She is alert and oriented to person, place, and time.  Skin: Skin is dry. No pallor.  Psychiatric: She has a normal mood and affect. Her behavior is normal.  Vitals reviewed.         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 Refer to pulmonology for further evaluation. She does seem to respond well to albuterol when she uses it and interestingly her FEF 25-75% improved significantly after albuterol.  There are multiple causes for this including obesity though she is not obese. And scar tissue etc.  Osteoporosis-we'll check on Prolia

## 2017-07-30 NOTE — Telephone Encounter (Signed)
I would like to start her on PRolia for her to process. Mia [name] see if we have to call her insurance to get this preapproved or not. Or if I just need to order it.

## 2017-08-02 ENCOUNTER — Ambulatory Visit (INDEPENDENT_AMBULATORY_CARE_PROVIDER_SITE_OTHER): Payer: Medicare Other | Admitting: Rheumatology

## 2017-08-02 ENCOUNTER — Other Ambulatory Visit (INDEPENDENT_AMBULATORY_CARE_PROVIDER_SITE_OTHER): Payer: Self-pay | Admitting: *Deleted

## 2017-08-02 ENCOUNTER — Ambulatory Visit: Payer: Medicare Other

## 2017-08-02 ENCOUNTER — Other Ambulatory Visit: Payer: Self-pay | Admitting: Family Medicine

## 2017-08-02 ENCOUNTER — Encounter: Payer: Self-pay | Admitting: Rheumatology

## 2017-08-02 VITALS — BP 116/65 | HR 73 | Resp 16 | Ht 66.0 in | Wt 181.0 lb

## 2017-08-02 DIAGNOSIS — M1120 Other chondrocalcinosis, unspecified site: Secondary | ICD-10-CM

## 2017-08-02 DIAGNOSIS — Z8719 Personal history of other diseases of the digestive system: Secondary | ICD-10-CM

## 2017-08-02 DIAGNOSIS — Z79899 Other long term (current) drug therapy: Secondary | ICD-10-CM

## 2017-08-02 DIAGNOSIS — M797 Fibromyalgia: Secondary | ICD-10-CM

## 2017-08-02 DIAGNOSIS — M810 Age-related osteoporosis without current pathological fracture: Secondary | ICD-10-CM

## 2017-08-02 DIAGNOSIS — Z87898 Personal history of other specified conditions: Secondary | ICD-10-CM

## 2017-08-02 DIAGNOSIS — M1712 Unilateral primary osteoarthritis, left knee: Secondary | ICD-10-CM

## 2017-08-02 DIAGNOSIS — M25532 Pain in left wrist: Secondary | ICD-10-CM

## 2017-08-02 DIAGNOSIS — M5030 Other cervical disc degeneration, unspecified cervical region: Secondary | ICD-10-CM

## 2017-08-02 DIAGNOSIS — Z96651 Presence of right artificial knee joint: Secondary | ICD-10-CM | POA: Diagnosis not present

## 2017-08-02 DIAGNOSIS — Z8679 Personal history of other diseases of the circulatory system: Secondary | ICD-10-CM

## 2017-08-02 DIAGNOSIS — M5136 Other intervertebral disc degeneration, lumbar region: Secondary | ICD-10-CM | POA: Diagnosis not present

## 2017-08-02 DIAGNOSIS — M5134 Other intervertebral disc degeneration, thoracic region: Secondary | ICD-10-CM | POA: Diagnosis not present

## 2017-08-02 DIAGNOSIS — R5382 Chronic fatigue, unspecified: Secondary | ICD-10-CM | POA: Diagnosis not present

## 2017-08-02 DIAGNOSIS — R202 Paresthesia of skin: Secondary | ICD-10-CM

## 2017-08-02 DIAGNOSIS — Z8709 Personal history of other diseases of the respiratory system: Secondary | ICD-10-CM

## 2017-08-02 DIAGNOSIS — G1223 Primary lateral sclerosis: Secondary | ICD-10-CM

## 2017-08-02 DIAGNOSIS — M0600 Rheumatoid arthritis without rheumatoid factor, unspecified site: Secondary | ICD-10-CM

## 2017-08-02 DIAGNOSIS — Z8619 Personal history of other infectious and parasitic diseases: Secondary | ICD-10-CM

## 2017-08-02 DIAGNOSIS — N3010 Interstitial cystitis (chronic) without hematuria: Secondary | ICD-10-CM

## 2017-08-02 DIAGNOSIS — M25531 Pain in right wrist: Secondary | ICD-10-CM

## 2017-08-02 NOTE — Progress Notes (Signed)
Labs ordered for Prolia injection.  °

## 2017-08-02 NOTE — Patient Instructions (Signed)
Standing Labs We placed an order today for your standing lab work.    Please come back and get your standing labs in November and every 3 months  We have open lab Monday through Friday from 8:30-11:30 AM and 1:30-4 PM at the office of Dr. Obelia Bonello.   The office is located at [address], [address], [place],  [address] No appointment is necessary.   Labs are drawn by [facility].  You may receive a bill from [facility] for your lab work. If you have any questions regarding directions or hours of operation,  please call [phone].    

## 2017-08-02 NOTE — Telephone Encounter (Signed)
No auth required. prolia ordered. Labs placed. Pt advised and scheduled.

## 2017-08-04 ENCOUNTER — Institutional Professional Consult (permissible substitution): Admit: 2017-08-04 | Discharge: 2017-08-04 | Payer: MEDICARE | Primary: Internal Medicine

## 2017-08-04 DIAGNOSIS — Z23 Encounter for immunization: Secondary | ICD-10-CM

## 2017-08-16 ENCOUNTER — Other Ambulatory Visit: Payer: Self-pay | Admitting: Family Medicine

## 2017-08-16 ENCOUNTER — Other Ambulatory Visit: Payer: Self-pay | Admitting: Rheumatology

## 2017-08-17 ENCOUNTER — Telehealth: Payer: Self-pay

## 2017-08-17 ENCOUNTER — Telehealth: Payer: Self-pay | Admitting: Cardiovascular Disease

## 2017-08-17 NOTE — Telephone Encounter (Signed)
Agree with Army Melia, RN

## 2017-08-17 NOTE — Telephone Encounter (Signed)
New message    Pt is calling asking for a call back.   Patient c/o Palpitations:  High priority if patient c/o lightheadedness, shortness of breath, or chest pain  1) How long have you had palpitations/irregular HR/ Afib? Are you having the symptoms now? Pt states her heart is racing. Started last night  2) Are you currently experiencing lightheadedness, SOB or CP? No   3) Do you have a history of afib (atrial fibrillation) or irregular heart rhythm? Pt states she has SVT  4) Have you checked your BP or HR? (document readings if available):   5) Are you experiencing any other symptoms? No

## 2017-08-17 NOTE — Telephone Encounter (Signed)
Spoke with patient who states her heart started racing last night and she felt thumping in her chest. She states her HR was approximately 150 bpm and then 94 bpm after resting for a while. She states she did not sleep well and continues to have elevated HR today. She states HR has been 154 bpm and 126 bpm today; states currently it is 120. She reports that she has not taken any propranolol because she was scared to take it. She states she is compliant with metoprolol 75 mg BID. I advised her to take the propranolol 10 mg up to 4 times per day as needed. I asked if she has attempted the valsalva maneuver and she denies. We reviewed how to do this. I advised her to take a propranolol now and to repeat in 20 minutes if HR does not slow down. She states she has been having knee pain for a week or longer and she thinks the pain is contributing to the tachycardia. I advised her to see her PCP for the knee pain and to call back if her HR does not slow down with propranolol. She verbalized understanding and agreement and thanked me for my help.

## 2017-08-17 NOTE — Telephone Encounter (Signed)
Thanks

## 2017-08-17 NOTE — Telephone Encounter (Signed)
Last Visit: 08/02/17 Next Visit: 01/06/17 Labs: 06/04/17 Glu mildly elevated. Rest WNL  Okay to refill per Dr. [name]

## 2017-08-17 NOTE — Telephone Encounter (Signed)
Pt states she is having a very sharp stabbing pain in the back of her left knee when standing up. Pt states that she is also having a pain that runs from her thigh down to her foot but most of the pain is behind her left knee. Pt states she has SVT and her heart rate has been high. Pt thinks it could be due to pain. Pt stated she called her cardiologist and he recommended her to have her knee checked. Pt was advised to schedule an appt with provider. Pt scheduled an appt with Dr. Darene Lamer.

## 2017-08-18 ENCOUNTER — Ambulatory Visit (HOSPITAL_BASED_OUTPATIENT_CLINIC_OR_DEPARTMENT_OTHER)
Admission: RE | Admit: 2017-08-18 | Discharge: 2017-08-18 | Disposition: A | Discharge status: Home / Self Care | Payer: Medicare Other | Source: Ambulatory Visit | Attending: Sports Medicine | Admitting: Sports Medicine

## 2017-08-18 ENCOUNTER — Other Ambulatory Visit: Payer: Self-pay | Admitting: Sports Medicine

## 2017-08-18 ENCOUNTER — Ambulatory Visit (INDEPENDENT_AMBULATORY_CARE_PROVIDER_SITE_OTHER): Payer: Medicare Other | Admitting: Sports Medicine

## 2017-08-18 ENCOUNTER — Encounter: Payer: Self-pay | Admitting: Sports Medicine

## 2017-08-18 DIAGNOSIS — R000 Tachycardia, unspecified: Secondary | ICD-10-CM | POA: Diagnosis not present

## 2017-08-18 DIAGNOSIS — I700 Atherosclerosis of aorta: Secondary | ICD-10-CM | POA: Insufficient documentation

## 2017-08-18 DIAGNOSIS — Z96651 Presence of right artificial knee joint: Secondary | ICD-10-CM | POA: Diagnosis not present

## 2017-08-18 DIAGNOSIS — M1712 Unilateral primary osteoarthritis, left knee: Secondary | ICD-10-CM

## 2017-08-18 DIAGNOSIS — M170 Bilateral primary osteoarthritis of knee: Secondary | ICD-10-CM | POA: Insufficient documentation

## 2017-08-18 DIAGNOSIS — M25462 Effusion, left knee: Secondary | ICD-10-CM | POA: Insufficient documentation

## 2017-08-18 DIAGNOSIS — M25561 Pain in right knee: Secondary | ICD-10-CM | POA: Diagnosis not present

## 2017-08-18 DIAGNOSIS — M11262 Other chondrocalcinosis, left knee: Secondary | ICD-10-CM | POA: Diagnosis not present

## 2017-08-18 DIAGNOSIS — R0602 Shortness of breath: Secondary | ICD-10-CM

## 2017-08-18 LAB — CBC
HCT: 43.3 % (ref 35.0–45.0)
Hemoglobin: 14.3 g/dL (ref 11.7–15.5)
MCH: 29.9 pg (ref 27.0–33.0)
MCHC: 33 g/dL (ref 32.0–36.0)
MCV: 90.6 fL (ref 80.0–100.0)
MPV: 10.6 fL (ref 7.5–12.5)
Platelets: 385 10*3/uL (ref 140–400)
RBC: 4.78 10*6/uL (ref 3.80–5.10)
RDW: 12.6 % (ref 11.0–15.0)
WBC: 6.7 10*3/uL (ref 3.8–10.8)

## 2017-08-18 LAB — CK TOTAL AND CKMB (NOT AT ARMC)
CK, MB: <0.7 ng/mL (ref 0–5.0)
Total CK: 96 U/L (ref 29–143)

## 2017-08-18 LAB — COMPREHENSIVE METABOLIC PANEL
AG Ratio: 1.6 (calc) (ref 1.0–2.5)
ALT: 25 U/L (ref 6–29)
AST: 27 U/L (ref 10–35)
Albumin: 4.5 g/dL (ref 3.6–5.1)
Alkaline phosphatase (APISO): 90 U/L (ref 33–130)
BUN: 7 mg/dL (ref 7–25)
CO2: 29 mmol/L (ref 20–32)
Calcium: 10.2 mg/dL (ref 8.6–10.4)
Chloride: 100 mmol/L (ref 98–110)
Creat: 0.84 mg/dL (ref 0.50–0.99)
Globulin: 2.8 g/dL (calc) (ref 1.9–3.7)
Glucose, Bld: 117 mg/dL — ABNORMAL HIGH (ref 65–99)
Potassium: 4.2 mmol/L (ref 3.5–5.3)
Sodium: 138 mmol/L (ref 135–146)
Total Bilirubin: 0.6 mg/dL (ref 0.2–1.2)
Total Protein: 7.3 g/dL (ref 6.1–8.1)

## 2017-08-18 LAB — TSH: TSH: 1.1 mIU/L (ref 0.40–4.50)

## 2017-08-18 MED ORDER — IOPAMIDOL (ISOVUE-370) INJECTION 76%
100.0000 mL | Freq: Once | INTRAVENOUS | Status: AC | PRN
  Administered 2017-08-18: 100 mL via INTRAVENOUS

## 2017-08-18 NOTE — Assessment & Plan Note (Addendum)
With tachycardia, labs, CT angiogram of the pulmonary arteries. She will need to go to [place] for this. Because we have injected her knee, I plan to call her back later today to see if her heart rate has improved with control of her knee pain, if not we will probably need to bring her back for an ECG and to increase her metoprolol.

## 2017-08-18 NOTE — Assessment & Plan Note (Signed)
Repeat injection of left knee. X-rays. Return in 1 month for this.

## 2017-08-18 NOTE — Telephone Encounter (Signed)
Patient has an appointment with her PCP today for evaluation of her knee pain

## 2017-08-18 NOTE — Progress Notes (Signed)
  Subjective:    CC: Shortness of breath, knee pain  HPI: This is a pleasant 66 year old female, she has a history of supraventricular tachycardia, typically well controlled with metoprolol and propranolol which she has been compliant with.  She was asked to go up on her propranolol.  She is here today, and unfortunately feels some degree of shortness of breath, heart rate ranging from 120-150, chest pain is slightly pleuritic.  In addition she is having severe left knee pain, injection sometime ago provided good relief, pain is moderate, persistent, localized in the posterior medial joint line radiating through the knee without mechanical symptoms, mild swelling, gelling.  Known knee osteoarthritis.  She feels as though this is the precipitant of her tachycardia.  Past medical history:  Negative.  See flowsheet/record as well for more information.  Surgical history: Negative.  See flowsheet/record as well for more information.  Family history: Negative.  See flowsheet/record as well for more information.  Social history: Negative.  See flowsheet/record as well for more information.  Allergies, and medications have been entered into the medical record, reviewed, and no changes needed.   Review of Systems: No fevers, chills, night sweats, weight loss, chest pain, or shortness of breath.   Objective:    General: Well Developed, well nourished, and in no acute distress.  Neuro: Alert and oriented x3, extra-ocular muscles intact, sensation grossly intact.  HEENT: Normocephalic, atraumatic, pupils equal round reactive to light, neck supple, no masses, no lymphadenopathy, thyroid nonpalpable.  Skin: Warm and dry, no rashes. Cardiac: Regular rhythm, tachycardic, no murmurs rubs or gallops, no lower extremity edema.  Respiratory: Clear to auscultation bilaterally. Not using accessory muscles, speaking in full sentences. Left knee: Minimally swollen with tenderness at the medial and posterior joint  lines ROM normal in flexion and extension and lower leg rotation. Ligaments with solid consistent endpoints including ACL, PCL, LCL, MCL. Negative Mcmurray's and provocative meniscal tests. Non painful patellar compression. Patellar and quadriceps tendons unremarkable. Hamstring and quadriceps strength is normal. No calf swelling or tenderness, negative bilateral Homans sign  Procedure: Real-time Ultrasound Guided Injection of left knee Device: GE Logiq E  Verbal informed consent obtained.  Time-out conducted.  Noted no overlying erythema, induration, or other signs of local infection.  Skin prepped in a sterile fashion.  Local anesthesia: Topical Ethyl chloride.  With sterile technique and under real time ultrasound guidance: 1 cc kenalog 40, 2 cc lidocaine, 2 cc bupivacaine injected easily Completed without difficulty  Pain immediately resolved suggesting accurate placement of the medication.  Advised to call if fevers/chills, erythema, induration, drainage, or persistent bleeding.  Images permanently stored and available for review in the ultrasound unit.  Impression: Technically successful ultrasound guided injection.  Impression and Recommendations:    Primary osteoarthritis of left knee Repeat injection of left knee. X-rays. Return in 1 month for this.  Shortness of breath With tachycardia, labs, CT angiogram of the pulmonary arteries. She will need to go to [place] for this. Because we have injected her knee, I plan to call her back later today to see if her heart rate has improved with control of her knee pain, if not we will probably need to bring her back for an ECG and to increase her metoprolol. ___________________________________________ Gwen Her. Dianah Field, M.D., ABFM., CAQSM. Primary Care and [place] Instructor of [place] [employer] [facility] [employer]

## 2017-08-19 ENCOUNTER — Ambulatory Visit (INDEPENDENT_AMBULATORY_CARE_PROVIDER_SITE_OTHER): Payer: Medicare Other | Admitting: Physical Medicine and Rehabilitation

## 2017-08-19 ENCOUNTER — Ambulatory Visit (INDEPENDENT_AMBULATORY_CARE_PROVIDER_SITE_OTHER): Payer: Medicare Other | Admitting: Nurse Practitioner

## 2017-08-19 ENCOUNTER — Telehealth: Payer: Self-pay | Admitting: Cardiovascular Disease

## 2017-08-19 ENCOUNTER — Encounter (INDEPENDENT_AMBULATORY_CARE_PROVIDER_SITE_OTHER): Payer: Self-pay | Admitting: Physical Medicine and Rehabilitation

## 2017-08-19 VITALS — BP 140/100 | HR 105 | Temp 99.1°F | Ht 66.0 in | Wt 174.1 lb

## 2017-08-19 VITALS — BP 152/93 | HR 103

## 2017-08-19 DIAGNOSIS — I471 Supraventricular tachycardia, unspecified: Secondary | ICD-10-CM

## 2017-08-19 DIAGNOSIS — R202 Paresthesia of skin: Secondary | ICD-10-CM | POA: Diagnosis not present

## 2017-08-19 DIAGNOSIS — R000 Tachycardia, unspecified: Secondary | ICD-10-CM

## 2017-08-19 MED ORDER — PROPRANOLOL HCL 10 MG PO TABS: 10.0000 mg | ORAL_TABLET | Freq: Four times a day (QID) | ORAL | 6 refills | Status: DC | PRN

## 2017-08-19 MED ORDER — METOPROLOL TARTRATE 100 MG PO TABS: 100.0000 mg | ORAL_TABLET | Freq: Two times a day (BID) | ORAL | 11 refills | Status: DC

## 2017-08-19 NOTE — Telephone Encounter (Signed)
New message   Update from yesterday    STAT if HR is under 50 or over 120 (normal HR is 60-100 beats per minute)  1) What is your heart rate?  Yes -113 resting she took her medication and bp went up 152/93  156/96   2) Do you have a log of your heart rate readings (document readings)?  no  3) Do you have any other symptoms? Woke up 3 am this morning , she woke up with rapid heart rate and she was covered in sweat so bad she had to get up and change clothes

## 2017-08-19 NOTE — Progress Notes (Signed)
Mia [name] - 66 y.o. female MRN [id number]  Date of birth: Jul 18, 1951  Office Visit Note: Visit Date: 08/19/2017 PCP: Hali Marry, MD Referred by: Hali Marry, *  Subjective: Chief Complaint  Patient presents with  . Right Hand - Pain  . Left Hand - Pain   HPI: Mia [name] is a 66 year old right-hand-dominant female who is followed by Dr. [name] with a history of seronegative rheumatoid arthritis, fibromyalgia, interstitial cystitis prior cervical fusion by Dr. Lynann Bologna.  She reports recent history of 2 weeks of bilateral left worse than right hand pain with numbness and that the symptoms are worse in the radial.  Left in particular she will get pain going up to the elbow and pain in she reports at times it feels like the whole hand hurts and tingles.  Is not had any specific injury.  She has had a prior electrodiagnostic study 5 years ago by Dr. Mina Marble.  Feels like she was told at the time that she had some carpal tunnel syndrome.  We do not have that study for review.    ROS Otherwise per HPI.  Assessment & Plan: Visit Diagnoses:  1. Paresthesia of skin     Plan: No additional findings.  Impression: The above electrodiagnostic study is ABNORMAL and reveals evidence of:  1.  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   2.  A moderate right median nerve entrapment at the wrist (carpal tunnel syndrome) affecting sensory and motor components.  There is no significant electrodiagnostic evidence of any other focal nerve entrapment, brachial plexopathy, cervical radiculopathy or generalized peripheral neuropathy.  As you know, this particular electrodiagnostic study cannot rule out chemical radiculitis or sensory only radiculopathy.    This electrodiagnostic study cannot rule out small fiber polyneuropathy and dysesthesias from central pain sensitization syndromes such as  fibromyalgia.  Recommendations: 1.  Follow-up with referring physician. 2.  Continue current management of symptoms. 3.  Continue use of resting splint at night-time and as needed during the day. 4.  Suggest surgical evaluation.    Meds & Orders: No orders of the defined types were placed in this encounter.   Orders Placed This Encounter  Procedures  . NCV with EMG (electromyography)    Follow-up: Return for Dr. [name].   Procedures: No procedures performed  EMG & NCV Findings: Evaluation of the left median motor and the right median motor nerves showed prolonged distal onset latency (L4.6, R4.9 ms) and decreased conduction velocity (Elbow-Wrist, L49, R49 m/s).  The left median (across palm) sensory and the right median (across palm) sensory nerves showed prolonged distal peak latency (Wrist, L5.6, R5.3 ms) and prolonged distal peak latency (Palm, L2.1, R4.5 ms).  All remaining nerves (as indicated in the following tables) were within normal limits.  Left vs. Right side comparison data for the ulnar motor nerve indicates abnormal L-R velocity difference (A Elbow-B Elbow, 22 m/s).  All remaining left vs. right side differences were within normal limits.    Needle evaluation of the left abductor pollicis brevis muscle showed increased insertional activity.  All remaining muscles (as indicated in the following table) showed no evidence of electrical instability.    Impression: The above electrodiagnostic study is ABNORMAL and reveals evidence of:  1.  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   2.  A moderate right median nerve  entrapment at the wrist (carpal tunnel syndrome) affecting sensory and motor components.  There is no significant electrodiagnostic evidence of any other focal nerve entrapment, brachial plexopathy, cervical radiculopathy or generalized peripheral neuropathy.  As you know,  this particular electrodiagnostic study cannot rule out chemical radiculitis or sensory only radiculopathy.    This electrodiagnostic study cannot rule out small fiber polyneuropathy and dysesthesias from central pain sensitization syndromes such as fibromyalgia.  Recommendations: 1.  Follow-up with referring physician. 2.  Continue current management of symptoms. 3.  Continue use of resting splint at night-time and as needed during the day. 4.  Suggest surgical evaluation.   Nerve Conduction Studies Anti Sensory Summary Table   Stim Site NR Peak (ms) Norm Peak (ms) P-T Amp (V) Norm P-T Amp Site1 Site2 Delta-P (ms) Dist (cm) Vel (m/s) Norm Vel (m/s)  Left Median Acr Palm Anti Sensory (2nd Digit)  31.2C  Wrist    *5.6 <3.6 18.2 >10 Wrist Palm 3.5 0.0    Palm    *2.1 <2.0 9.6         Right Median Acr Palm Anti Sensory (2nd Digit)  31.3C  Wrist    *5.3 <3.6 15.3 >10 Wrist Palm 0.8 0.0    Palm    *4.5 <2.0 1.7         Left Radial Anti Sensory (Base 1st Digit)  31.3C  Wrist    2.2 <3.1 38.0  Wrist Base 1st Digit 2.2 0.0    Right Radial Anti Sensory (Base 1st Digit)  32.9C  Wrist    2.3 <3.1 29.1  Wrist Base 1st Digit 2.3 0.0    Left Ulnar Anti Sensory (5th Digit)  31.6C  Wrist    3.5 <3.7 25.3 >15.0 Wrist 5th Digit 3.5 14.0 40 >38  Right Ulnar Anti Sensory (5th Digit)  31.7C  Wrist    3.3 <3.7 21.6 >15.0 Wrist 5th Digit 3.3 14.0 42 >38   Motor Summary Table   Stim Site NR Onset (ms) Norm Onset (ms) O-P Amp (mV) Norm O-P Amp Site1 Site2 Delta-0 (ms) Dist (cm) Vel (m/s) Norm Vel (m/s)  Left Median Motor (Abd Poll Brev)  31.5C  Wrist    *4.6 <4.2 5.8 >5 Elbow Wrist 4.5 22.0 *49 >50  Elbow    9.1  4.4         Right Median Motor (Abd Poll Brev)  32.5C  Wrist    *4.9 <4.2 7.7 >5 Elbow Wrist 4.4 21.5 *49 >50  Elbow    9.3  3.9         Left Ulnar Motor (Abd Dig Min)  31.8C  Wrist    2.7 <4.2 7.2 >3 B Elbow Wrist 3.5 20.0 57 >53  B Elbow    6.2  6.4  A Elbow B Elbow 1.3 9.0 69  >53  A Elbow    7.5  6.4         Right Ulnar Motor (Abd Dig Min)  32.1C  Wrist    2.9 <4.2 9.4 >3 B Elbow Wrist 3.5 20.0 57 >53  B Elbow    6.4  8.9  A Elbow B Elbow 1.1 10.0 91 >53  A Elbow    7.5  8.4          EMG   Side Muscle Nerve Root Ins Act Fibs Psw Amp Dur Poly Recrt Int Fraser Din Comment  Left Abd Poll Brev Median C8-T1 *CRD Nml Nml Nml Nml 0 Nml Nml   Left 1stDorInt Ulnar C8-T1  Nml Nml Nml Nml Nml 0 Nml Nml   Left PronatorTeres Median C6-7 Nml Nml Nml Nml Nml 0 Nml Nml   Left Biceps Musculocut C5-6 Nml Nml Nml Nml Nml 0 Nml Nml   Left Deltoid Axillary C5-6 Nml Nml Nml Nml Nml 0 Nml Nml     Nerve Conduction Studies Anti Sensory Left/Right Comparison   Stim Site L Lat (ms) R Lat (ms) L-R Lat (ms) L Amp (V) R Amp (V) L-R Amp (%) Site1 Site2 L Vel (m/s) R Vel (m/s) L-R Vel (m/s)  Median Acr Palm Anti Sensory (2nd Digit)  31.2C  Wrist *5.6 *5.3 0.3 18.2 15.3 15.9 Wrist Palm     Palm *2.1 *4.5 2.4 9.6 1.7 82.3       Radial Anti Sensory (Base 1st Digit)  31.3C  Wrist 2.2 2.3 0.1 38.0 29.1 23.4 Wrist Base 1st Digit     Ulnar Anti Sensory (5th Digit)  31.6C  Wrist 3.5 3.3 0.2 25.3 21.6 14.6 Wrist 5th Digit 40 42 2   Motor Left/Right Comparison   Stim Site L Lat (ms) R Lat (ms) L-R Lat (ms) L Amp (mV) R Amp (mV) L-R Amp (%) Site1 Site2 L Vel (m/s) R Vel (m/s) L-R Vel (m/s)  Median Motor (Abd Poll Brev)  31.5C  Wrist *4.6 *4.9 0.3 5.8 7.7 24.7 Elbow Wrist *49 *49 0  Elbow 9.1 9.3 0.2 4.4 3.9 11.4       Ulnar Motor (Abd Dig Min)  31.8C  Wrist 2.7 2.9 0.2 7.2 9.4 23.4 B Elbow Wrist 57 57 0  B Elbow 6.2 6.4 0.2 6.4 8.9 28.1 A Elbow B Elbow 69 91 *22  A Elbow 7.5 7.5 0.0 6.4 8.4 23.8          Waveforms:                     Clinical History: No specialty comments available.  She reports that she has never smoked. She has never used smokeless tobacco.   Recent Labs  01/04/17 1039 07/05/17 1123  HGBA1C 5.7 5.8    Objective:  VS:  HT:    WT:   BMI:      BP:(!) 152/93  HR:(!) 103bpm  TEMP: ( )  RESP:  Physical Exam  Musculoskeletal:  Inspection reveals no atrophy of the bilateral APB or FDI or hand intrinsics. There is no swelling, color changes, allodynia or dystrophic changes. There is 5 out of 5 strength in the bilateral wrist extension, finger abduction and long finger flexion. There is intact sensation to light touch in all dermatomal and peripheral nerve distributions. There is a negative Hoffmann's test bilaterally.    Ortho Exam Imaging: Ct Angio Chest Pe W Or Wo Contrast  Result Date: 08/18/2017 CLINICAL DATA:  Tachycardia for the past 3 days. EXAM: CT ANGIOGRAPHY CHEST WITH CONTRAST TECHNIQUE: Multidetector CT imaging of the chest was performed using the standard protocol during bolus administration of intravenous contrast. Multiplanar CT image reconstructions and MIPs were obtained to evaluate the vascular anatomy. CONTRAST:  100 cc Isovue 370 COMPARISON:  Chest radiographs dated 07/05/2017. FINDINGS: Cardiovascular: Normally opacified pulmonary arteries with no pulmonary arterial filling defects. Small amount of aortic calcification. Mediastinum/Nodes: No enlarged mediastinal, hilar, or axillary lymph nodes. Thyroid gland, trachea, and esophagus demonstrate no significant findings. Lungs/Pleura: Lungs are clear. No pleural effusion or pneumothorax. Upper Abdomen: Cholecystectomy clips. Small upper pole right renal cyst. Musculoskeletal: Mild thoracic spine degenerative changes. Cervical spine fixation hardware. Review of  the MIP images confirms the above findings. IMPRESSION: 1. No pulmonary emboli or acute abnormality. 2. Mild calcific aortic atherosclerosis. Aortic Atherosclerosis (ICD10-I70.0). Electronically Signed   By: Claudie Revering M.D.   On: 08/18/2017 17:29   Dg Knee Complete 4 Views Left  Result Date: 08/18/2017 CLINICAL DATA:  Knee pain EXAM: LEFT KNEE - COMPLETE 4+ VIEW COMPARISON:  10/27/2016, 02/27/2015 FINDINGS: No  fracture or malalignment. Moderate tricompartmental arthritis. Prominent lateral disc space calcification. Small joint effusion. IMPRESSION: 1. 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 Small joint effusion Electronically Signed   By: Donavan Foil M.D.   On: 08/18/2017 19:13   Dg Knee Complete 4 Views Right  Result Date: 08/18/2017 CLINICAL DATA:  Bilateral knee pain EXAM: RIGHT KNEE - COMPLETE 4+ VIEW COMPARISON:  09/09/2015 FINDINGS: Status post right knee replacement with intact hardware and normal alignment. No fracture. Mild soft tissue swelling. IMPRESSION: Status post right knee replacement.  No acute osseous abnormality. Electronically Signed   By: Donavan Foil M.D.   On: 08/18/2017 19:12    Past Medical/Family/Surgical/Social History: Medications & Allergies reviewed per EMR Patient Active Problem List   Diagnosis Date Noted  . Restrictive lung disease 07/30/2017  . Mixed hyperlipidemia 06/18/2017  . Osteoporosis 04/18/2017  . Trochanteric bursitis of both hips 02/24/2017  . Stress fracture of left tibia 11/05/2016  . Inflammatory arthritis 09/30/2016  . High risk medication use 09/30/2016  . History of Clostridium difficile colitis 09/30/2016  . 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 Pseudogout 09/30/2016  . DDD cervical spine status post fusion 09/30/2016  . DDD thoracic spine 09/30/2016  . H/O total knee replacement, right 09/30/2016  . Age-related osteoporosis without current pathological fracture 09/30/2016  . Tremor, essential 05/14/2016  . Right foot pain 04/14/2016  . B12 deficiency 09/10/2015  . Syncope 09/10/2015  . Cervical facet joint syndrome 09/10/2015  . Chronic fatigue 09/10/2015  . Aortic atherosclerosis ([place]) 04/01/2015  . Primary osteoarthritis of left knee 02/27/2015  . Shortness of breath   . DOE (dyspnea on exertion)   . Benign head tremor 06/11/2014  . Lumbar degenerative disc disease 06/11/2014  . IFG (impaired  fasting glucose) 03/09/2014  . Osteopenia 03/09/2014  . Hemorrhoid 03/09/2014  . Retinal wrinkling, right eye 03/09/2014  . Fibromyalgia 03/09/2014  . GERD (gastroesophageal reflux disease) 03/09/2014  . History of arthroplasty of right knee 01/31/2014  . Memory difficulty 12/14/2013  . Hemorrhoids, external, thrombosed 06/22/2011  . Hypertension 03/11/2011  . SVT (supraventricular tachycardia) ([place])   . Raynaud phenomenon   . EDEMA 08/25/2010  . Nonspecific (abnormal) findings on radiological and other examination of body structure 08/25/2010  . COMPUTERIZED TOMOGRAPHY, CHEST, ABNORMAL 08/25/2010  . OSA (obstructive sleep apnea) 04/24/2009  . 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 Allergic rhinitis 06/11/2008  . Dyspnea 06/11/2008  . 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 Chronic diastolic CHF (congestive heart failure) ([place]) 06/08/2008  . INTERSTITIAL CYSTITIS 06/08/2008   Past Medical History:  Diagnosis Date  . Anal fissure   . Anemia   . Arthritis   . Blood transfusion   . C. difficile colitis   . Chest pain    a. 01/2013 MV: EF 59%, no ischemia.  . Chronic Dyspnea    a. 01/2013 Echo: EF 60-65%, Gr 1 DD, PASP 4mmHg.  . Diabetes mellitus   . Fibromyalgia   . Gall stones   . GERD (gastroesophageal reflux disease)    gastritis  . Hemorrhoids   . Hypertension   .  Interstitial cystitis   . Memory difficulty 12/14/2013  . Neuromuscular disorder ([place])    sclerosis  . Osteoporosis   . Peptic ulcer   . PONV (postoperative nausea and vomiting)   . Pseudogout   . Raynaud phenomenon   . Rectal bleeding   . Sleep apnea    a. on cpap.  Marland Kitchen SVT (supraventricular tachycardia) ([place])   . Syncope 09/10/2015  . Thyroid disease    hypothyroidism  . Tremor, essential 05/14/2016   Family History  Problem Relation Age of Onset  . Heart attack Mother   . Stroke Mother   . Diabetes Mother   . Heart disease Mother   . Colon polyps Mother   . Asthma Mother   .  Breast cancer Maternal Grandmother   . Wilson's disease Maternal Grandmother   . Pancreatic cancer Maternal Grandfather   . Heart disease Father   . Heart attack Father   . Asthma Father   . Stroke Sister   . Hypertension Sister   . Rheum arthritis Sister   . Dementia Sister   . Asthma Sister   . Lupus Sister   . Asthma Sister    Past Surgical History:  Procedure Laterality Date  . APPENDECTOMY    . BLADDER SURGERY     x2  . BLADDER SURGERY    . BREAST BIOPSY    . CARDIAC CATHETERIZATION    . CATARACT EXTRACTION Bilateral   . CHOLECYSTECTOMY    . DILATION AND CURETTAGE OF UTERUS    . ENTEROCELE REPAIR     x2  . EYE SURGERY     retina  . hysterectomy - unknown type    . KNEE ARTHROPLASTY    . KNEE SURGERY     x6  . NECK SURGERY     fusion  . OOPHORECTOMY    . QUADRICEPS REPAIR Right   . RECTOCELE REPAIR     x2  . SHOULDER ARTHROSCOPY WITH SUBACROMIAL DECOMPRESSION, ROTATOR CUFF REPAIR AND BICEP TENDON REPAIR  10/06/2012   Procedure: SHOULDER ARTHROSCOPY WITH SUBACROMIAL DECOMPRESSION, ROTATOR CUFF REPAIR AND BICEP TENDON REPAIR;  Surgeon: Nita Sells, MD;  Location: [facility];  Service: Orthopedics;  Laterality: Right;  Arthroscopic  Repair  of  Subscapularis, Open Biceps Tenodesis  . SHOULDER SURGERY     bilateral- bones spur  . TONSILLECTOMY    . TOTAL KNEE ARTHROPLASTY    . TOTAL SHOULDER ARTHROPLASTY     Social History   Occupational History  . Retired    Social History Main Topics  . Smoking status: Never Smoker  . Smokeless tobacco: Never Used  . Alcohol use No  . Drug use: No  . Sexual activity: Not on file

## 2017-08-19 NOTE — Progress Notes (Signed)
Faxed

## 2017-08-19 NOTE — Patient Instructions (Addendum)
Medication Instructions:  Your physician has recommended you make the following change in your medication:  INCREASE Metoprolol (Lopressor) to 100 mg twice daily CONTINUE Propranolol 10 mg up to 4 times per day as needed   Labwork: None Ordered   Testing/Procedures: Your physician has recommended that you wear an event monitor. Event monitors are medical devices that record the heart's electrical activity. Doctors most often [place] these monitors to diagnose arrhythmias. Arrhythmias are problems with the speed or rhythm of the heartbeat. The monitor is a small, portable device. You can wear one while you do your normal daily activities. This is usually used to diagnose what is causing palpitations/syncope (passing out).   Follow-Up: Your physician recommends that you return for a follow-up appointment on Nov. 16 with Dr. Acie Fredrickson   If you need a refill on your cardiac medications before your next appointment, please call your pharmacy.   Thank you for choosing [facility]! Christen Bame, RN [phone]

## 2017-08-19 NOTE — Telephone Encounter (Signed)
Spoke with patient who states she continues to have fast HR and SOB. She states she saw her an orthopedist yesterday for her knee pain and he was very concerned about her resting HR of 137 bpm. She states it increases to 150's bpm when she is up moving around. She states she has been taking the propranolol intermittently. She reports HR 103 bpm at rest today. She also c/o headache and states she woke up sweating so much that she had to change clothes. I asked if she could come into the office for an ekg today and she confirmed that she will do so.

## 2017-08-19 NOTE — Progress Notes (Deleted)
Pain and tingling in hands which wakes her up at night. Pain from elbow to hand. Left is the worst. Symptoms started a few weeks ago. Entire hand hurts and tingles. Right hand dominant.

## 2017-08-19 NOTE — Procedures (Signed)
EMG & NCV Findings: Evaluation of the left median motor and the right median motor nerves showed prolonged distal onset latency (L4.6, R4.9 ms) and decreased conduction velocity (Elbow-Wrist, L49, R49 m/s).  The left median (across palm) sensory and the right median (across palm) sensory nerves showed prolonged distal peak latency (Wrist, L5.6, R5.3 ms) and prolonged distal peak latency (Palm, L2.1, R4.5 ms).  All remaining nerves (as indicated in the following tables) were within normal limits.  Left vs. Right side comparison data for the ulnar motor nerve indicates abnormal L-R velocity difference (A Elbow-B Elbow, 22 m/s).  All remaining left vs. right side differences were within normal limits.    Needle evaluation of the left abductor pollicis brevis muscle showed increased insertional activity.  All remaining muscles (as indicated in the following table) showed no evidence of electrical instability.    Impression: The above electrodiagnostic study is ABNORMAL and reveals evidence of:  1.  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   2.  A moderate right median nerve entrapment at the wrist (carpal tunnel syndrome) affecting sensory and motor components.  There is no significant electrodiagnostic evidence of any other focal nerve entrapment, brachial plexopathy, cervical radiculopathy or generalized peripheral neuropathy.  As you know, this particular electrodiagnostic study cannot rule out chemical radiculitis or sensory only radiculopathy.    This electrodiagnostic study cannot rule out small fiber polyneuropathy and dysesthesias from central pain sensitization syndromes such as fibromyalgia.  Recommendations: 1.  Follow-up with referring physician. 2.  Continue current management of symptoms. 3.  Continue use of resting splint at night-time and as needed during the day. 4.  Suggest surgical  evaluation.   Nerve Conduction Studies Anti Sensory Summary Table   Stim Site NR Peak (ms) Norm Peak (ms) P-T Amp (V) Norm P-T Amp Site1 Site2 Delta-P (ms) Dist (cm) Vel (m/s) Norm Vel (m/s)  Left Median Acr Palm Anti Sensory (2nd Digit)  31.2C  Wrist    *5.6 <3.6 18.2 >10 Wrist Palm 3.5 0.0    Palm    *2.1 <2.0 9.6         Right Median Acr Palm Anti Sensory (2nd Digit)  31.3C  Wrist    *5.3 <3.6 15.3 >10 Wrist Palm 0.8 0.0    Palm    *4.5 <2.0 1.7         Left Radial Anti Sensory (Base 1st Digit)  31.3C  Wrist    2.2 <3.1 38.0  Wrist Base 1st Digit 2.2 0.0    Right Radial Anti Sensory (Base 1st Digit)  32.9C  Wrist    2.3 <3.1 29.1  Wrist Base 1st Digit 2.3 0.0    Left Ulnar Anti Sensory (5th Digit)  31.6C  Wrist    3.5 <3.7 25.3 >15.0 Wrist 5th Digit 3.5 14.0 40 >38  Right Ulnar Anti Sensory (5th Digit)  31.7C  Wrist    3.3 <3.7 21.6 >15.0 Wrist 5th Digit 3.3 14.0 42 >38   Motor Summary Table   Stim Site NR Onset (ms) Norm Onset (ms) O-P Amp (mV) Norm O-P Amp Site1 Site2 Delta-0 (ms) Dist (cm) Vel (m/s) Norm Vel (m/s)  Left Median Motor (Abd Poll Brev)  31.5C  Wrist    *4.6 <4.2 5.8 >5 Elbow Wrist 4.5 22.0 *49 >50  Elbow    9.1  4.4         Right Median Motor (Abd Poll Brev)  32.Fraser  Wrist    *4.9 <4.2 7.7 >5 Elbow Wrist 4.4 21.5 *49 >50  Elbow    9.3  3.9         Left Ulnar Motor (Abd Dig Min)  31.8C  Wrist    2.7 <4.2 7.2 >3 B Elbow Wrist 3.5 20.0 57 >53  B Elbow    6.2  6.4  A Elbow B Elbow 1.3 9.0 69 >53  A Elbow    7.5  6.4         Right Ulnar Motor (Abd Dig Min)  32.1C  Wrist    2.9 <4.2 9.4 >3 B Elbow Wrist 3.5 20.0 57 >53  B Elbow    6.4  8.9  A Elbow B Elbow 1.1 10.0 91 >53  A Elbow    7.5  8.4          EMG   Side Muscle Nerve Root Ins Act Fibs Psw Amp Dur Poly Recrt Int Fraser Din Comment  Left Abd Poll Brev Median C8-T1 *CRD Nml Nml Nml Nml 0 Nml Nml   Left 1stDorInt Ulnar C8-T1 Nml Nml Nml Nml Nml 0 Nml Nml   Left PronatorTeres Median C6-7 Nml Nml Nml Nml  Nml 0 Nml Nml   Left Biceps Musculocut C5-6 Nml Nml Nml Nml Nml 0 Nml Nml   Left Deltoid Axillary C5-6 Nml Nml Nml Nml Nml 0 Nml Nml     Nerve Conduction Studies Anti Sensory Left/Right Comparison   Stim Site L Lat (ms) R Lat (ms) L-R Lat (ms) L Amp (V) R Amp (V) L-R Amp (%) Site1 Site2 L Vel (m/s) R Vel (m/s) L-R Vel (m/s)  Median Acr Palm Anti Sensory (2nd Digit)  31.2C  Wrist *5.6 *5.3 0.3 18.2 15.3 15.9 Wrist Palm     Palm *2.1 *4.5 2.4 9.6 1.7 82.3       Radial Anti Sensory (Base 1st Digit)  31.3C  Wrist 2.2 2.3 0.1 38.0 29.1 23.4 Wrist Base 1st Digit     Ulnar Anti Sensory (5th Digit)  31.6C  Wrist 3.5 3.3 0.2 25.3 21.6 14.6 Wrist 5th Digit 40 42 2   Motor Left/Right Comparison   Stim Site L Lat (ms) R Lat (ms) L-R Lat (ms) L Amp (mV) R Amp (mV) L-R Amp (%) Site1 Site2 L Vel (m/s) R Vel (m/s) L-R Vel (m/s)  Median Motor (Abd Poll Brev)  31.5C  Wrist *4.6 *4.9 0.3 5.8 7.7 24.7 Elbow Wrist *49 *49 0  Elbow 9.1 9.3 0.2 4.4 3.9 11.4       Ulnar Motor (Abd Dig Min)  31.8C  Wrist 2.7 2.9 0.2 7.2 9.4 23.4 B Elbow Wrist 57 57 0  B Elbow 6.2 6.4 0.2 6.4 8.9 28.1 A Elbow B Elbow 69 91 *22  A Elbow 7.5 7.5 0.0 6.4 8.4 23.8          Waveforms:

## 2017-08-23 DIAGNOSIS — M810 Age-related osteoporosis without current pathological fracture: Secondary | ICD-10-CM | POA: Diagnosis not present

## 2017-08-24 LAB — COMPLETE METABOLIC PANEL WITH GFR
AG Ratio: 1.8 (calc) (ref 1.0–2.5)
ALT: 20 U/L (ref 6–29)
AST: 26 U/L (ref 10–35)
Albumin: 4.3 g/dL (ref 3.6–5.1)
Alkaline phosphatase (APISO): 77 U/L (ref 33–130)
BUN: 9 mg/dL (ref 7–25)
CO2: 28 mmol/L (ref 20–32)
Calcium: 9.5 mg/dL (ref 8.6–10.4)
Chloride: 97 mmol/L — ABNORMAL LOW (ref 98–110)
Creat: 0.92 mg/dL (ref 0.50–0.99)
GFR, Est African American: 76 mL/min/{1.73_m2} (ref >=60)
GFR, Est Non African American: 65 mL/min/{1.73_m2} (ref >=60)
Globulin: 2.4 g/dL (calc) (ref 1.9–3.7)
Glucose, Bld: 87 mg/dL (ref 65–139)
Potassium: 4 mmol/L (ref 3.5–5.3)
Sodium: 136 mmol/L (ref 135–146)
Total Bilirubin: 0.4 mg/dL (ref 0.2–1.2)
Total Protein: 6.7 g/dL (ref 6.1–8.1)

## 2017-08-24 LAB — TROPONIN T: Troponin T TROPT: <0.01 ng/mL (ref <0.01)

## 2017-08-26 DIAGNOSIS — R350 Frequency of micturition: Secondary | ICD-10-CM | POA: Diagnosis not present

## 2017-08-26 DIAGNOSIS — N3941 Urge incontinence: Secondary | ICD-10-CM | POA: Diagnosis not present

## 2017-08-27 ENCOUNTER — Ambulatory Visit (INDEPENDENT_AMBULATORY_CARE_PROVIDER_SITE_OTHER): Payer: Medicare Other | Admitting: Family Medicine

## 2017-08-27 VITALS — BP 92/62 | HR 77

## 2017-08-27 DIAGNOSIS — E538 Deficiency of other specified B group vitamins: Secondary | ICD-10-CM

## 2017-08-27 DIAGNOSIS — M810 Age-related osteoporosis without current pathological fracture: Secondary | ICD-10-CM | POA: Diagnosis not present

## 2017-08-27 DIAGNOSIS — I952 Hypotension due to drugs: Secondary | ICD-10-CM

## 2017-08-27 MED ORDER — METOPROLOL TARTRATE 50 MG PO TABS: 75.0000 mg | ORAL_TABLET | Freq: Two times a day (BID) | ORAL | 0 refills | Status: DC

## 2017-08-27 MED ORDER — CYANOCOBALAMIN 1000 MCG/ML IJ SOLN
1000.0000 ug | Freq: Once | INTRAMUSCULAR | Status: AC
  Administered 2017-08-27: 1000 ug via INTRAMUSCULAR

## 2017-08-27 MED ORDER — DENOSUMAB 60 MG/ML ~~LOC~~ SOLN
60.0000 mg | Freq: Once | SUBCUTANEOUS | Status: AC
  Administered 2017-08-27: 60 mg via SUBCUTANEOUS

## 2017-08-27 NOTE — Progress Notes (Signed)
Agree with below. \Catherine Metheney, MD  

## 2017-08-27 NOTE — Progress Notes (Signed)
   Subjective:    Patient ID: Mia [name], female    DOB: [birth year], 66 y.o.   MRN: [id number]  HPI  Mia [name] is here for a vitamin B 12 injection and Prolia injection for osteoporosis. Calcium and kidney function labs within normal limits. Her blood pressure is low today and she feels dizzy. Cardiology increased her Metoprolol to 100 mg twice daily. Denies muscle cramps, weakness or irregular heart rate.   Review of Systems     Objective:   Physical Exam        Assessment & Plan:  Osteoporosis - Patient tolerated injection well without complications. Patient advised to schedule next injection for 6 months from today.   B12 deficiency - Patient tolerated injection well without complications. Patient advised to schedule next injection 30 days from today.   Hypotension - Low blood pressure today. Decrease Metoprolol to 75 mg twice daily. Patient will take the Metoprolol 50 mg 1.5 tablets twice daily. She will call next week with blood pressure and pulse readings.

## 2017-09-01 DIAGNOSIS — R8271 Bacteriuria: Secondary | ICD-10-CM | POA: Diagnosis not present

## 2017-09-01 DIAGNOSIS — N3020 Other chronic cystitis without hematuria: Secondary | ICD-10-CM | POA: Diagnosis not present

## 2017-09-01 DIAGNOSIS — N3941 Urge incontinence: Secondary | ICD-10-CM | POA: Diagnosis not present

## 2017-09-01 DIAGNOSIS — N3010 Interstitial cystitis (chronic) without hematuria: Secondary | ICD-10-CM | POA: Diagnosis not present

## 2017-09-02 ENCOUNTER — Ambulatory Visit (INDEPENDENT_AMBULATORY_CARE_PROVIDER_SITE_OTHER): Payer: Medicare Other

## 2017-09-02 DIAGNOSIS — I471 Supraventricular tachycardia: Secondary | ICD-10-CM | POA: Diagnosis not present

## 2017-09-02 DIAGNOSIS — R000 Tachycardia, unspecified: Secondary | ICD-10-CM

## 2017-09-06 ENCOUNTER — Other Ambulatory Visit: Payer: Self-pay

## 2017-09-06 DIAGNOSIS — Z79899 Other long term (current) drug therapy: Secondary | ICD-10-CM | POA: Diagnosis not present

## 2017-09-06 LAB — CBC WITH DIFFERENTIAL/PLATELET
Basophils Absolute: 61 cells/uL (ref 0–200)
Basophils Relative: 1.2 %
Eosinophils Absolute: 138 cells/uL (ref 15–500)
Eosinophils Relative: 2.7 %
HCT: 40.9 % (ref 35.0–45.0)
Hemoglobin: 13.7 g/dL (ref 11.7–15.5)
Lymphs Abs: 1224 cells/uL (ref 850–3900)
MCH: 30.6 pg (ref 27.0–33.0)
MCHC: 33.5 g/dL (ref 32.0–36.0)
MCV: 91.5 fL (ref 80.0–100.0)
MPV: 10.7 fL (ref 7.5–12.5)
Monocytes Relative: 9.4 %
Neutro Abs: 3198 cells/uL (ref 1500–7800)
Neutrophils Relative %: 62.7 %
Platelets: 352 10*3/uL (ref 140–400)
RBC: 4.47 10*6/uL (ref 3.80–5.10)
RDW: 12.7 % (ref 11.0–15.0)
Total Lymphocyte: 24 %
WBC mixed population: 479 cells/uL (ref 200–950)
WBC: 5.1 10*3/uL (ref 3.8–10.8)

## 2017-09-06 LAB — COMPLETE METABOLIC PANEL WITH GFR
AG Ratio: 1.7 (calc) (ref 1.0–2.5)
ALT: 20 U/L (ref 6–29)
AST: 21 U/L (ref 10–35)
Albumin: 4.4 g/dL (ref 3.6–5.1)
Alkaline phosphatase (APISO): 88 U/L (ref 33–130)
BUN: 10 mg/dL (ref 7–25)
CO2: 31 mmol/L (ref 20–32)
Calcium: 10.1 mg/dL (ref 8.6–10.4)
Chloride: 100 mmol/L (ref 98–110)
Creat: 0.92 mg/dL (ref 0.50–0.99)
GFR, Est African American: 75 mL/min/{1.73_m2} (ref >=60)
GFR, Est Non African American: 65 mL/min/{1.73_m2} (ref >=60)
Globulin: 2.6 g/dL (calc) (ref 1.9–3.7)
Glucose, Bld: 124 mg/dL — ABNORMAL HIGH (ref 65–99)
Potassium: 4.6 mmol/L (ref 3.5–5.3)
Sodium: 138 mmol/L (ref 135–146)
Total Bilirubin: 0.4 mg/dL (ref 0.2–1.2)
Total Protein: 7 g/dL (ref 6.1–8.1)

## 2017-09-07 NOTE — Progress Notes (Signed)
Within normal limits

## 2017-09-09 ENCOUNTER — Encounter: Payer: Self-pay | Admitting: Emergency Medicine

## 2017-09-09 ENCOUNTER — Other Ambulatory Visit: Payer: Self-pay | Admitting: Orthopedic Surgery

## 2017-09-09 ENCOUNTER — Ambulatory Visit (INDEPENDENT_AMBULATORY_CARE_PROVIDER_SITE_OTHER): Payer: Medicare Other | Admitting: Emergency Medicine

## 2017-09-09 DIAGNOSIS — G4733 Obstructive sleep apnea (adult) (pediatric): Secondary | ICD-10-CM

## 2017-09-09 DIAGNOSIS — R0609 Other forms of dyspnea: Secondary | ICD-10-CM | POA: Diagnosis not present

## 2017-09-09 DIAGNOSIS — R0600 Dyspnea, unspecified: Secondary | ICD-10-CM

## 2017-09-09 DIAGNOSIS — M5412 Radiculopathy, cervical region: Secondary | ICD-10-CM

## 2017-09-09 NOTE — Assessment & Plan Note (Signed)
Tolerating CPAP well.  Using an AutoSet mask between 5 and 20 cm water.  Good clinical response, great compliance as documented on her download from today.  Continue same

## 2017-09-09 NOTE — Assessment & Plan Note (Addendum)
Exertional dyspnea with an extensive workup that has included pulmonary function testing, methacholine challenge, cardiopulmonary exercise test that showed a normal respiratory and cardiac response to exercise.  Since last visit she has experienced some exertional dyspnea that seems to correlate with episodes of tachycardia.  She has had difficulty with SVT over the years.  She underwent a CT pulmonary angiogram that did not show an abnormality, spirometry was done that did not show obstruction but which suggested possible mild restriction.  She also reports that she had a walking oximetry that showed a desaturation to 87%.  She increased her metoprolol and believes that both her tachycardia and her dyspnea are improved.  Given the extensive pulmonary workup that is already been done I tend to believe that the dyspnea is related to deconditioning and also to the episodic SVT.  I will repeat her walking oximetry today.  If she desaturates then I will repeat some of her pulmonary workup.  Addendum: She did not desaturate with ambulation on.  I believe we can defer any further pulmonary workup for now.

## 2017-09-09 NOTE — Patient Instructions (Addendum)
Repeat walking oximetry today did not show any low oxygen levels. Agree with cardiology follow-up Please continue to wear your CPAP reliably every night. Follow with Dr Lamonte Sakai in 12 months or sooner if you have any problems

## 2017-09-09 NOTE — Progress Notes (Signed)
History of Present Illness:  66 yo woman never smoker with hx paroxismal SVT, Holter showed correlation at the time with exertional dyspnea and lightheadedness. PFT, methacholine and CPEX were reassuring except exercise limited by tachycardia. Underwent CPAP titration and needs 14cm H2O pressure    ROV 09/09/17 --66 year old woman with a history of paroxysmal SVT, exertional dyspnea and lightheadedness that has had an extensive workup as detailed above.  She also has obstructive sleep apnea and has been treated with CPAP.  She returns today for evaluation.  Compliance download shows that she has used the device 93% of the time, 80% of the time for greater than 4 hours each night.  She is on an AutoSet mode between 5 and 20.  Pressure 95% of the time is 11 with no significant leak and good control of her events. She has had some tachycardia and dyspnea over the last month, no documented A fib yet, but she is wearing a cardiac monitor. A CT chest was done 08/18/17 >> no PE, no infiltrates. She underwent spirometry 07/30/17 at her PCP > possible restriction. She also tells me that she had an exertional desaturation w her PCP to 87%. She was tachycardic at the time - metoprolol increased, may have helped her breathing.    Vitals:   09/09/17 1001  BP: 124/82  Pulse: 82  SpO2: 96%  Weight: 176 lb (79.8 kg)  Height: 5' 6.6" (1.692 m)   Gen: Pleasant, well-nourished, in no distress,  normal affect  ENT: No lesions,  mouth clear,  oropharynx clear, no postnasal drip  Neck: No JVD, no TMG, no carotid bruits  Lungs: No use of accessory muscles, clear without rales or rhonch.  Cardiovascular: RRR, heart sounds normal, no murmur or gallops, no peripheral edema  Musculoskeletal: No deformities, no cyanosis or clubbing  Neuro: alert, non focal  Skin: Warm, no lesions or rashes    Cardiac Stress Test 02/01/13: Impression  Exercise Capacity: Lexiscan with low level exercise.  BP Response: Normal  blood pressure response.  Clinical Symptoms: There is dyspnea.  ECG Impression: No significant ST segment change suggestive of ischemia.  Comparison with Prior Nuclear Study: No significant change from previous study  Overall Impression: Normal stress nuclear study. No evidence of ischemia. Heart rate accelerated very rapidly on walking lexiscan protocol.  LV Ejection Fraction: 59%. LV Wall Motion: NL LV Function; NL Wall Motion  DOE (dyspnea on exertion) Exertional dyspnea with an extensive workup that has included pulmonary function testing, methacholine challenge, cardiopulmonary exercise test that showed a normal respiratory and cardiac response to exercise.  Since last visit she has experienced some exertional dyspnea that seems to correlate with episodes of tachycardia.  She has had difficulty with SVT over the years.  She underwent a CT pulmonary angiogram that did not show an abnormality, spirometry was done that did not show obstruction but which suggested possible mild restriction.  She also reports that she had a walking oximetry that showed a desaturation to 87%.  She increased her metoprolol and believes that both her tachycardia and her dyspnea are improved.  Given the extensive pulmonary workup that is already been done I tend to believe that the dyspnea is related to deconditioning and also to the episodic SVT.  I will repeat her walking oximetry today.  If she desaturates then I will repeat some of her pulmonary workup.  Addendum: She did not desaturate with ambulation on.  I believe we can defer any further pulmonary workup for now.  OSA (obstructive sleep apnea) Tolerating CPAP well.  Using an AutoSet mask between 5 and 20 cm water.  Good clinical response, great compliance as documented on her download from today.  Continue same  Baltazar Apo, MD, PhD 09/09/2017, 10:29 AM [facility] [phone] or if no answer [phone]

## 2017-09-10 ENCOUNTER — Encounter: Payer: Self-pay | Admitting: Cardiovascular Disease

## 2017-09-10 ENCOUNTER — Other Ambulatory Visit: Payer: Self-pay | Admitting: *Deleted

## 2017-09-10 ENCOUNTER — Ambulatory Visit (INDEPENDENT_AMBULATORY_CARE_PROVIDER_SITE_OTHER): Payer: Medicare Other | Admitting: Cardiovascular Disease

## 2017-09-10 VITALS — BP 136/74 | HR 74 | Ht 66.0 in | Wt 178.8 lb

## 2017-09-10 DIAGNOSIS — I471 Supraventricular tachycardia: Secondary | ICD-10-CM | POA: Diagnosis not present

## 2017-09-10 MED ORDER — METOPROLOL TARTRATE 100 MG PO TABS: 100.0000 mg | ORAL_TABLET | Freq: Two times a day (BID) | ORAL | 3 refills | Status: DC

## 2017-09-10 NOTE — Progress Notes (Signed)
Cardiology Office Note   Date:  09/10/2017   ID:  Mia [name], DOB 10/12/1951, MRN [id number]  PCP:  Mia Marry, MD  Cardiologist:   Mia Moores, MD   Chief Complaint  Patient presents with  . Follow-up    SVT, hyperlipidemia    1. SVT 2. Raynaud's Phenomenon 3. Diabetes Mellitus 4. Minimal CAD by cath 2006 Mia [name]) 5. Sleep apnea - CPAP is currently not working  02/11/05 Right heart catheterization:  RA pressure: 8 mmHg  RV pressure: 25/7 mmHg  PA pressure: 20/9 mmHg  PWCP: 9 mmHg  CO: 4.6 liters per minute  LEFT HEART CATHETERIZATION RESULTS:  1. Left main: No significant disease.  2. LAD: Mild luminal irregularities.  3. First and second diagonal: Moderate size, mild luminal irregularities.  4. Left circumflex: Nondominant, mild luminal irregularities.  5. RCA: Dominant with mild luminal irregularities.  6. LV: EF is 55 to 60%, no wall motion abnormalities. LV EDP was 9 mmHg.   December 28, 2014:  Mia [name] is a 66 y.o. female who presents for follow-up with an episode of chest pain. She was seen by our nurse practitioner, Mia [name].    Diltiazem was DC'd.   She was started on Metoprolol 50 bid.   Myoview study was normal. She has normal left ventricle systolic function. She is going to the [facility] 3 times a week.  Doing well.     She is feeling much better.    Sept. 1, 2016:  Has had C diff for the past several months .  Doing well from a cardiac standpoint .  Has been fatigued.  Doing well with the metoprolol  Still has shortness of breath .   Nov. 3, 2017:    Mia [name] is seen today with her husband, Mia [name].   Last week she started having some heart facing .  HR was 187 (using the app on her phone )  Could feel it in her ears. Got a little dizzy.  Sat down and relaxed and the episode resolved.  Had another episode this past Sunday .  HR stayed elevated for 2 hours   Aug. 24, 2018: Mia [name] is seen today with husband Mia [name].    Followed for HTN and hyperlipidemia  Has had some elevated diastolic BP readings . Eats some salty foods still - likes ham. Complains of leg pain / ache when she is standing .    Nov. 16, 2018  Mia [name] was having more SVT, we have increased the metoprolol  Up to metoprolol 100 mg BID. She is much better controlled at this point Wearing the event monitor    Past Medical History:  Diagnosis Date  . Anal fissure   . Anemia   . Arthritis   . Blood transfusion   . C. difficile colitis   . Chest pain    a. 01/2013 MV: EF 59%, no ischemia.  . Chronic Dyspnea    a. 01/2013 Echo: EF 60-65%, Gr 1 DD, PASP 65mmHg.  . Diabetes mellitus   . Fibromyalgia   . Gall stones   . GERD (gastroesophageal reflux disease)    gastritis  . Hemorrhoids   . Hypertension   . Interstitial cystitis   . Memory difficulty 12/14/2013  . Neuromuscular disorder ([place])    sclerosis  . Osteoporosis   . Peptic ulcer   . PONV (postoperative nausea and vomiting)   . Pseudogout   . Raynaud phenomenon   . Rectal bleeding   .  Sleep apnea    a. on cpap.  Marland Kitchen SVT (supraventricular tachycardia) ([place])   . Syncope 09/10/2015  . Thyroid disease    hypothyroidism  . Tremor, essential 05/14/2016    Past Surgical History:  Procedure Laterality Date  . APPENDECTOMY    . BLADDER SURGERY     x2  . BLADDER SURGERY    . BREAST BIOPSY    . CARDIAC CATHETERIZATION    . CATARACT EXTRACTION Bilateral   . CHOLECYSTECTOMY    . DILATION AND CURETTAGE OF UTERUS    . ENTEROCELE REPAIR     x2  . EYE SURGERY     retina  . hysterectomy - unknown type    . KNEE ARTHROPLASTY    . KNEE SURGERY     x6  . NECK SURGERY     fusion  . OOPHORECTOMY    . QUADRICEPS REPAIR Right   . RECTOCELE REPAIR     x2  . SHOULDER ARTHROSCOPY WITH SUBACROMIAL DECOMPRESSION, ROTATOR CUFF REPAIR AND BICEP TENDON REPAIR Right 10/06/2012   Performed by Mia Sells, MD at [facility]  . SHOULDER SURGERY     bilateral-  bones spur  . TONSILLECTOMY    . TOTAL KNEE ARTHROPLASTY    . TOTAL SHOULDER ARTHROPLASTY       Current Outpatient Medications  Medication Sig Dispense Refill  . AMBULATORY NON FORMULARY MEDICATION Medication Name: One touch ultra strips. Check fasting blood sugar in the morning and as needed.  Dx - [id number] Fax to [phone] 50 strip 11  . atorvastatin (LIPITOR) 40 MG tablet Take 1 tablet (40 mg total) by mouth daily. 90 tablet 3  . calcium carbonate (OS-CAL) 600 MG TABS Take 600 mg by mouth daily with breakfast.     . cetirizine (ZYRTEC) 10 MG tablet Take 10 mg by mouth as needed for allergies.    . Cyanocobalamin (VITAMIN B-12 IJ) Inject 1,000 mg as directed every 28 (twenty-eight) days.    . cyclobenzaprine (FLEXERIL) 10 MG tablet One half tab PO qHS, then increase gradually to one tab TID. 30 tablet 0  . denosumab (PROLIA) 60 MG/ML SOLN injection Inject 60 mg into the skin every 6 (six) months. Administer in upper arm, thigh, or abdomen    . donepezil (ARICEPT) 5 MG tablet Take 1 tablet (5 mg total) by mouth at bedtime. 90 tablet 3  . fesoterodine (TOVIAZ) 8 MG TB24 tablet Take 8 mg by mouth daily.    . furosemide (LASIX) 20 MG tablet Take 1 tablet (20 mg total) by mouth daily. 90 tablet 3  . gabapentin (NEURONTIN) 300 MG capsule TAKE 1 TO 2 CAPSULES BY MOUTH UP TO TWO TIMES DAILY AS NEEDED 180 capsule 3  . ipratropium (ATROVENT) 0.03 % nasal spray Place 2 sprays into both nostrils every 12 (twelve) hours as needed for rhinitis.    Marland Kitchen leflunomide (ARAVA) 20 MG tablet TAKE 1 TABLET EVERY DAY 90 tablet 0  . Melatonin 5 MG TABS Take 5 mg by mouth at bedtime as needed.    . memantine (NAMENDA) 10 MG tablet Take 1 tablet (10 mg total) by mouth 2 (two) times daily. 180 tablet 4  . metFORMIN (GLUCOPHAGE-XR) 500 MG 24 hr tablet TAKE 1 TABLET EVERY DAY WITH BREAKFAST 90 tablet 1  . metoprolol tartrate (LOPRESSOR) 100 MG tablet Take 100 mg 2 (two) times daily by mouth.    . NON FORMULARY Place  into the nose at bedtime. cpap machine    .  ondansetron (ZOFRAN-ODT) 4 MG disintegrating tablet Take 4 mg by mouth every 6 (six) hours as needed for nausea or vomiting.    . ONE TOUCH ULTRA TEST test strip USE TO TEST BLOOD SUGAR ONCE DAILY 100 each 0  . pantoprazole (PROTONIX) 40 MG tablet Take 40 mg by mouth 2 (two) times daily.    . primidone (MYSOLINE) 50 MG tablet TAKE 1 TABLET AT BEDTIME 90 tablet 0  . Probiotic Product (PROBIOTIC ADVANCED PO) Take 1 capsule by mouth daily.    . propranolol (INDERAL) 10 MG tablet Take 1 tablet (10 mg total) by mouth 4 (four) times daily as needed. 90 tablet 6  . SUMAtriptan (IMITREX) 50 MG tablet Take 1 tablet (50 mg total) by mouth every 2 (two) hours as needed. 9 tablet 6  . venlafaxine XR (EFFEXOR-XR) 75 MG 24 hr capsule TAKE 1 CAPSULE BY MOUTH  DAILY WITH BREAKFAST 90 capsule 3  . vitamin D, CHOLECALCIFEROL, 400 UNITS tablet Take 400 Units by mouth daily.      No current facility-administered medications for this visit.     Allergies:   Myrbetriq  [mirabegron]; Percocet [oxycodone-acetaminophen]; Celebrex [celecoxib]; Codeine; Darvocet [propoxyphene n-acetaminophen]; Erythromycin; Hydrocodone; Lyrica [pregabalin]; Macrodantin [nitrofurantoin]; Toradol [ketorolac tromethamine]; Tramadol; and Verapamil    Social History:  The patient  reports that  has never smoked. she has never used smokeless tobacco. She reports that she does not drink alcohol or use drugs.   Family History:  The patient's family history includes Asthma in her father, mother, sister, and sister; Breast cancer in her maternal grandmother; Colon polyps in her mother; Dementia in her sister; Diabetes in her mother; Heart attack in her father and mother; Heart disease in her father and mother; Hypertension in her sister; Lupus in her sister; Pancreatic cancer in her maternal grandfather; Rheum arthritis in her sister; Stroke in her mother and sister; Wilson's disease in her maternal  grandmother.    ROS:  Please see the history of present illness.      All other systems are reviewed and negative.   Physical Exam: Blood pressure 136/74, pulse 74, height 5\' 6"  (1.676 m), weight 178 lb 12.8 oz (81.1 kg), SpO2 97 %. GEN:  Well nourished, well developed in no acute distress HEENT: Normal NECK: No JVD; No carotid bruits LYMPHATICS: No lymphadenopathy CARDIAC: RR, no murmurs, rubs, gallops RESPIRATORY:  Clear to auscultation without rales, wheezing or rhonchi  ABDOMEN: Soft, non-tender, non-distended MUSCULOSKELETAL:  No edema; No deformity  SKIN: Warm and dry NEUROLOGIC:  Alert and oriented x 3    EKG:  EKG is not ordered today.      Recent Labs: 08/18/2017: TSH 1.10 09/06/2017: ALT 20; BUN 10; Creat 0.92; Hemoglobin 13.7; Platelets 352; Potassium 4.6; Sodium 138    Lipid Panel    Component Value Date/Time   CHOL 119 06/18/2017 0951   TRIG 161 (H) 06/18/2017 0951   HDL 40 06/18/2017 0951   CHOLHDL 3.0 06/18/2017 0951   CHOLHDL 3.2 08/28/2016 1558   VLDL 22 08/28/2016 1558   LDLCALC 47 06/18/2017 0951      Wt Readings from Last 3 Encounters:  09/10/17 178 lb 12.8 oz (81.1 kg)  09/09/17 176 lb (79.8 kg)  08/19/17 174 lb 1.6 oz (79 kg)      Other studies Reviewed: Additional studies/ records that were reviewed today include: . Review of the above records demonstrates:    ASSESSMENT AND PLAN:  1. SVT -    Was having more SVT ,  We have increased the metoprolol - now taking 100 mg PO BID  She has propranolol to take on an as-needed basis. The SVT seems to be fairly well controlled at present.   She seems to be tolerating the higher dose of metoprolol at this time.    If the SVT becomes more difficult to control then will need to consider SVT ablation.  I will see her again in 3 months for follow-up visit.  3. Diabetes Mellitus  4. Minimal CAD by cath 2006 Mia [name])  5. Sleep apnea - CPAP is currently not working  6. Dyspnea on exertion:   .  Current medicines are reviewed at length with the patient today.  The patient does not have concerns regarding medicines.  The following changes have been made:  no change  Disposition:   FU with me in 6  months     Mia Moores, MD  09/10/2017 11:58 AM    [facility] [address], [place], [place]  [address] Phone: [phone]; Fax: [phone]

## 2017-09-10 NOTE — Patient Instructions (Signed)
Medication Instructions:  Your physician recommends that you continue on your current medications as directed. Please refer to the Current Medication list given to you today.   Labwork: -None  Testing/Procedures: -None  Follow-Up: Your physician recommends that you keep your scheduled  follow-up appointment with Dr. Acie Fredrickson.   Any Other Special Instructions Will Be Listed Below (If Applicable).     If you need a refill on your cardiac medications before your next appointment, please call your pharmacy.

## 2017-09-14 ENCOUNTER — Other Ambulatory Visit: Payer: Self-pay | Admitting: Family Medicine

## 2017-09-15 ENCOUNTER — Encounter: Payer: Self-pay | Admitting: Sports Medicine

## 2017-09-15 ENCOUNTER — Ambulatory Visit (INDEPENDENT_AMBULATORY_CARE_PROVIDER_SITE_OTHER): Payer: Medicare Other | Admitting: Sports Medicine

## 2017-09-15 DIAGNOSIS — M1712 Unilateral primary osteoarthritis, left knee: Secondary | ICD-10-CM

## 2017-09-15 DIAGNOSIS — G8929 Other chronic pain: Secondary | ICD-10-CM | POA: Diagnosis not present

## 2017-09-15 DIAGNOSIS — M25562 Pain in left knee: Secondary | ICD-10-CM

## 2017-09-15 NOTE — Progress Notes (Signed)
  Subjective:    CC: Left knee pain  HPI: Mia [name] is a pleasant 66 year old female, I injected her knee sometime ago and she had good relief, more recently last month she returned with a return of her knee pain, swelling, we aspirated and injected it, she did well for a few days and had a recurrence of pain, swelling, mechanical symptoms.  Symptoms are moderate, persistent and localized at the medial joint line.  Past medical history:  Negative.  See flowsheet/record as well for more information.  Surgical history: Negative.  See flowsheet/record as well for more information.  Family history: Negative.  See flowsheet/record as well for more information.  Social history: Negative.  See flowsheet/record as well for more information.  Allergies, and medications have been entered into the medical record, reviewed, and no changes needed.   Review of Systems: No fevers, chills, night sweats, weight loss, chest pain, or shortness of breath.   Objective:    General: Well Developed, well nourished, and in no acute distress.  Neuro: Alert and oriented x3, extra-ocular muscles intact, sensation grossly intact.  HEENT: Normocephalic, atraumatic, pupils equal round reactive to light, neck supple, no masses, no lymphadenopathy, thyroid nonpalpable.  Skin: Warm and dry, no rashes. Cardiac: Regular rate and rhythm, no murmurs rubs or gallops, no lower extremity edema.  Respiratory: Clear to auscultation bilaterally. Not using accessory muscles, speaking in full sentences. Left knee: Visible and palpable effusion with a fluid wave and difficulty straightening out the knee with about 5 degrees of extension lag. Ligaments with solid consistent endpoints including ACL, PCL, LCL, MCL. Negative Mcmurray's and provocative meniscal tests. Non painful patellar compression. Patellar and quadriceps tendons unremarkable. Hamstring and quadriceps strength is normal.  Procedure: Real-time Ultrasound Guided aspiration  of left knee Device: GE Logiq E  Verbal informed consent obtained.  Time-out conducted.  Noted no overlying erythema, induration, or other signs of local infection.  Skin prepped in a sterile fashion.  Local anesthesia: Topical Ethyl chloride.  With sterile technique and under real time ultrasound guidance: Aspirated 20 cc straw-colored fluid, syringe switched and 3 cc lidocaine, 3 cc bupivacaine injected easily Completed without difficulty  Pain immediately resolved suggesting accurate placement of the medication.  Advised to call if fevers/chills, erythema, induration, drainage, or persistent bleeding.  Images permanently stored and available for review in the ultrasound unit.  Impression: Technically successful ultrasound guided injection.  Impression and Recommendations:    Primary osteoarthritis of left knee Injection at this time did not provide sufficient relief, she is still having mechanical symptoms, effusions and difficulty straightening out the knee. Therapeutic arthrocentesis as above. Referral to Dr. Tamera Punt (per her request), she will need an arthroscopy, I am going to get an MRI to tee her up. ___________________________________________ Gwen Her. Dianah Field, M.D., ABFM., CAQSM. Primary Care and [place] Instructor of [place] [facility]

## 2017-09-15 NOTE — Assessment & Plan Note (Signed)
Injection at this time did not provide sufficient relief, she is still having mechanical symptoms, effusions and difficulty straightening out the knee. Therapeutic arthrocentesis as above. Referral to Dr. Tamera Punt (per her request), she will need an arthroscopy, I am going to get an MRI to tee her up.

## 2017-09-19 ENCOUNTER — Ambulatory Visit
Admission: RE | Admit: 2017-09-19 | Discharge: 2017-09-19 | Disposition: A | Discharge status: Home / Self Care | Payer: Medicare Other | Source: Ambulatory Visit | Attending: Orthopedic Surgery | Admitting: Orthopedic Surgery

## 2017-09-19 DIAGNOSIS — M5412 Radiculopathy, cervical region: Secondary | ICD-10-CM

## 2017-09-19 DIAGNOSIS — M542 Cervicalgia: Secondary | ICD-10-CM | POA: Diagnosis not present

## 2017-09-21 ENCOUNTER — Other Ambulatory Visit: Payer: Self-pay | Admitting: *Deleted

## 2017-09-21 DIAGNOSIS — R350 Frequency of micturition: Secondary | ICD-10-CM | POA: Diagnosis not present

## 2017-09-21 DIAGNOSIS — M4692 Unspecified inflammatory spondylopathy, cervical region: Secondary | ICD-10-CM | POA: Diagnosis not present

## 2017-09-21 DIAGNOSIS — M542 Cervicalgia: Secondary | ICD-10-CM | POA: Diagnosis not present

## 2017-09-21 MED ORDER — GABAPENTIN 300 MG PO CAPS: ORAL_CAPSULE | ORAL | 3 refills | Status: DC

## 2017-09-27 ENCOUNTER — Ambulatory Visit (INDEPENDENT_AMBULATORY_CARE_PROVIDER_SITE_OTHER): Payer: Medicare Other | Admitting: Family Medicine

## 2017-09-27 ENCOUNTER — Ambulatory Visit (INDEPENDENT_AMBULATORY_CARE_PROVIDER_SITE_OTHER): Payer: Medicare Other

## 2017-09-27 VITALS — BP 130/60 | HR 70 | Wt 183.0 lb

## 2017-09-27 DIAGNOSIS — M25562 Pain in left knee: Secondary | ICD-10-CM | POA: Diagnosis not present

## 2017-09-27 DIAGNOSIS — M1712 Unilateral primary osteoarthritis, left knee: Secondary | ICD-10-CM | POA: Diagnosis not present

## 2017-09-27 DIAGNOSIS — M2342 Loose body in knee, left knee: Secondary | ICD-10-CM

## 2017-09-27 DIAGNOSIS — G8929 Other chronic pain: Secondary | ICD-10-CM

## 2017-09-27 DIAGNOSIS — E538 Deficiency of other specified B group vitamins: Secondary | ICD-10-CM | POA: Diagnosis not present

## 2017-09-27 MED ORDER — CYANOCOBALAMIN 1000 MCG/ML IJ SOLN
1000.0000 ug | Freq: Once | INTRAMUSCULAR | Status: AC
  Administered 2017-09-27: 1000 ug via INTRAMUSCULAR

## 2017-09-27 NOTE — Progress Notes (Signed)
   Subjective:    Patient ID: Mia [name], female    DOB: [birth year], 66 y.o.   MRN: [id number]  HPI  Mia [name] is here for a vitamin B 12 injection. Denies muscle cramps, weakness or irregular heart rate.   Review of Systems     Objective:   Physical Exam        Assessment & Plan:  Patient tolerated injection well without complications. Patient advised to schedule next injection 30 days from today.

## 2017-09-28 DIAGNOSIS — G5602 Carpal tunnel syndrome, left upper limb: Secondary | ICD-10-CM | POA: Diagnosis not present

## 2017-09-28 DIAGNOSIS — M1812 Unilateral primary osteoarthritis of first carpometacarpal joint, left hand: Secondary | ICD-10-CM | POA: Diagnosis not present

## 2017-09-28 DIAGNOSIS — G5601 Carpal tunnel syndrome, right upper limb: Secondary | ICD-10-CM | POA: Diagnosis not present

## 2017-09-29 DIAGNOSIS — M25562 Pain in left knee: Secondary | ICD-10-CM | POA: Diagnosis not present

## 2017-10-07 ENCOUNTER — Encounter: Payer: Self-pay | Admitting: Family Medicine

## 2017-10-07 ENCOUNTER — Ambulatory Visit: Payer: Medicare Other | Admitting: Osteopathic Medicine

## 2017-10-07 ENCOUNTER — Ambulatory Visit (INDEPENDENT_AMBULATORY_CARE_PROVIDER_SITE_OTHER): Payer: Medicare Other | Admitting: Family Medicine

## 2017-10-07 VITALS — BP 130/82 | HR 92 | Ht 66.0 in | Wt 176.0 lb

## 2017-10-07 DIAGNOSIS — J329 Chronic sinusitis, unspecified: Secondary | ICD-10-CM

## 2017-10-07 DIAGNOSIS — J40 Bronchitis, not specified as acute or chronic: Secondary | ICD-10-CM

## 2017-10-07 MED ORDER — AMOXICILLIN-POT CLAVULANATE 875-125 MG PO TABS: 1.0000 | ORAL_TABLET | Freq: Two times a day (BID) | ORAL | 0 refills | Status: DC

## 2017-10-07 NOTE — Progress Notes (Signed)
   Subjective:    Patient ID: Mia [name], female    DOB: 1951-10-25, 66 y.o.   MRN: [id number]  HPI 66 year old female comes in today complaining of cough, sinus pressure and headache for about 2 weeks.  No fever, chills or sweat. No ST or ear pain.  Waking up with HA daily. She is couhging up a lot phlegm.  She has been taking Sudafed, Mucinex DM, Aleve and ibuprofen.  Her throat feels dry.   Review of Systems     Objective:   Physical Exam  Constitutional: She is oriented to person, place, and time. She appears well-developed and well-nourished.  HENT:  Head: Normocephalic and atraumatic.  Right Ear: External ear normal.  Left Ear: External ear normal.  Nose: Nose normal.  Mouth/Throat: Oropharynx is clear and moist.  TMs and canals are clear.   Eyes: Conjunctivae and EOM are normal. Pupils are equal, round, and reactive to light.  Neck: Neck supple. No thyromegaly present.  Cardiovascular: Normal rate, regular rhythm and normal heart sounds.  Pulmonary/Chest: Effort normal and breath sounds normal. She has no wheezes.  Lymphadenopathy:    She has no cervical adenopathy.  Neurological: She is alert and oriented to person, place, and time.  Skin: Skin is warm and dry.  Psychiatric: She has a normal mood and affect.        Assessment & Plan:  Sinobronchitis  -since she said symptoms for 2 weeks at this point and is not improving we will go ahead and treat with Augmentin.  Call if not significantly better after 5 days.  Okay to continue over-the-counter medications and make sure with staying well-hydrated.

## 2017-10-12 ENCOUNTER — Telehealth: Payer: Self-pay

## 2017-10-12 NOTE — Telephone Encounter (Signed)
Mia [name] called and states she feels better. However, she is having diarrhea 8 times a day since Saturday. She is on the antibiotic but she did have c-diff in the past. She is worried this may be c-diff. Please advise.

## 2017-10-13 NOTE — Telephone Encounter (Signed)
At this point she should have had 7 days of the antibiotic so it is okay to stop it.  Studies show that 7 days typically works just as good as 10 days.  And we can see if over the next couple of days she actually is feeling better.  It may just be a direct side effect of the antibiotic.  But if diarrhea persists then we do need to test for C. difficile.

## 2017-10-13 NOTE — Telephone Encounter (Signed)
LM on Vm for pt to return call. KG LPN

## 2017-10-14 NOTE — Telephone Encounter (Signed)
Patient advised.

## 2017-10-15 DIAGNOSIS — R350 Frequency of micturition: Secondary | ICD-10-CM | POA: Diagnosis not present

## 2017-10-15 DIAGNOSIS — N3941 Urge incontinence: Secondary | ICD-10-CM | POA: Diagnosis not present

## 2017-10-28 ENCOUNTER — Other Ambulatory Visit: Payer: Self-pay | Admitting: Sports Medicine

## 2017-10-28 DIAGNOSIS — M5136 Other intervertebral disc degeneration, lumbar region: Secondary | ICD-10-CM

## 2017-11-01 ENCOUNTER — Ambulatory Visit: Payer: Medicare Other | Admitting: Family Medicine

## 2017-11-02 ENCOUNTER — Other Ambulatory Visit: Payer: Self-pay | Admitting: Rheumatology

## 2017-11-02 ENCOUNTER — Encounter

## 2017-11-03 NOTE — Telephone Encounter (Signed)
Last Visit: 08/02/17 Next Visit: 01/06/17 Labs: 09/06/17 WNL  Okay to refill per Dr. [name]

## 2017-11-04 ENCOUNTER — Ambulatory Visit (INDEPENDENT_AMBULATORY_CARE_PROVIDER_SITE_OTHER): Payer: Medicare Other | Admitting: Family Medicine

## 2017-11-04 ENCOUNTER — Encounter: Payer: Self-pay | Admitting: Family Medicine

## 2017-11-04 VITALS — BP 125/80 | HR 81 | Ht 66.0 in | Wt 172.0 lb

## 2017-11-04 DIAGNOSIS — G250 Essential tremor: Secondary | ICD-10-CM

## 2017-11-04 DIAGNOSIS — I10 Essential (primary) hypertension: Secondary | ICD-10-CM

## 2017-11-04 DIAGNOSIS — I471 Supraventricular tachycardia: Secondary | ICD-10-CM | POA: Diagnosis not present

## 2017-11-04 DIAGNOSIS — E538 Deficiency of other specified B group vitamins: Secondary | ICD-10-CM

## 2017-11-04 DIAGNOSIS — M797 Fibromyalgia: Secondary | ICD-10-CM | POA: Diagnosis not present

## 2017-11-04 DIAGNOSIS — M542 Cervicalgia: Secondary | ICD-10-CM | POA: Diagnosis not present

## 2017-11-04 MED ORDER — CYANOCOBALAMIN 1000 MCG/ML IJ SOLN
1000.0000 ug | Freq: Once | INTRAMUSCULAR | Status: AC
  Administered 2017-11-04: 1000 ug via INTRAMUSCULAR

## 2017-11-04 NOTE — Progress Notes (Addendum)
Subjective:    Patient ID: Mia [name], female    DOB: 1951-08-18, 67 y.o.   MRN: [id number]  HPI F/U tremor -overall doing okay.  No specific concerns in regards to her tremor today.  Is currently on metoprolol mostly for her SVT but also tends to help tremor as well.  Follow-up hypertension-we actually reduced her metoprolol about 2 months ago when I saw her because her blood pressures were running a little low at that time.  Use her metoprolol to 75 mg twice a day.  She was having an increase in her SVT so they increased her metoprolol back up to 100 mg twice a day back in November.  And she uses propranolol on an as-needed basis for rescue.  Follow-up pernicious anemia-due for B12 injection today.  SVT-currently on metoprolol.  She is back up to 100 mg and overall doing well.  She wanted to know if she could drive some. Her family didn't let her drive to [place].    Fibromyalgia-she reports that she still has episodes of severe fatigue where it really hard to get out of bed but she forces herself to.  She volunteers a lot and really tries to push herself.  Her husband feels like she may be pushes herself a little too much and over commits to things.  Also having some upper neck pain bilaterally.  She turned her head and started to get some more severe spasms.  It seems to be getting a little bit better but she still having some pain at the base of the head.  Review of Systems  BP 125/80   Pulse 81   Ht 5\' 6"  (1.676 m)   Wt 172 lb (78 kg)   SpO2 96%   BMI 27.76 kg/m     Allergies  Allergen Reactions  . Myrbetriq  [Mirabegron] Other (See Comments)    SEVERE HEADACHE  . Percocet [Oxycodone-Acetaminophen] Nausea And Vomiting  . Celebrex [Celecoxib] Other (See Comments)    Other reaction(s): Other flushed  . Codeine Nausea And Vomiting  . Darvocet [Propoxyphene N-Acetaminophen] Nausea And Vomiting  . Erythromycin Nausea And Vomiting    Nausea and vomiting   . Hydrocodone  Nausea And Vomiting  . Lyrica [Pregabalin] Swelling    Legs swelling   . Macrodantin [Nitrofurantoin] Nausea And Vomiting  . Toradol [Ketorolac Tromethamine] Nausea And Vomiting    Per patient, only PO form causes nausea and vomiting. Can take injection without issue  . Tramadol Nausea And Vomiting  . Verapamil Nausea And Vomiting    REACTION: intolerance    Past Medical History:  Diagnosis Date  . Anal fissure   . Anemia   . Arthritis   . Blood transfusion   . C. difficile colitis   . Chest pain    a. 01/2013 MV: EF 59%, no ischemia.  . Chronic Dyspnea    a. 01/2013 Echo: EF 60-65%, Gr 1 DD, PASP 28mmHg.  . Diabetes mellitus   . Fibromyalgia   . Gall stones   . GERD (gastroesophageal reflux disease)    gastritis  . Hemorrhoids   . Hypertension   . Interstitial cystitis   . Memory difficulty 12/14/2013  . Neuromuscular disorder ([place])    sclerosis  . Osteoporosis   . Peptic ulcer   . PONV (postoperative nausea and vomiting)   . Pseudogout   . Raynaud phenomenon   . Rectal bleeding   . Sleep apnea    a. on cpap.  Marland Kitchen SVT (  supraventricular tachycardia) ([place])   . Syncope 09/10/2015  . Thyroid disease    hypothyroidism  . Tremor, essential 05/14/2016    Past Surgical History:  Procedure Laterality Date  . APPENDECTOMY    . BLADDER SURGERY     x2  . BLADDER SURGERY    . BREAST BIOPSY    . CARDIAC CATHETERIZATION    . CATARACT EXTRACTION Bilateral   . CHOLECYSTECTOMY    . DILATION AND CURETTAGE OF UTERUS    . ENTEROCELE REPAIR     x2  . EYE SURGERY     retina  . hysterectomy - unknown type    . KNEE ARTHROPLASTY    . KNEE SURGERY     x6  . NECK SURGERY     fusion  . OOPHORECTOMY    . QUADRICEPS REPAIR Right   . RECTOCELE REPAIR     x2  . SHOULDER ARTHROSCOPY WITH SUBACROMIAL DECOMPRESSION, ROTATOR CUFF REPAIR AND BICEP TENDON REPAIR  10/06/2012   Procedure: SHOULDER ARTHROSCOPY WITH SUBACROMIAL DECOMPRESSION, ROTATOR CUFF REPAIR AND BICEP TENDON REPAIR;   Surgeon: Nita Sells, MD;  Location: [facility];  Service: Orthopedics;  Laterality: Right;  Arthroscopic  Repair  of  Subscapularis, Open Biceps Tenodesis  . SHOULDER SURGERY     bilateral- bones spur  . TONSILLECTOMY    . TOTAL KNEE ARTHROPLASTY    . TOTAL SHOULDER ARTHROPLASTY      Social History   Socioeconomic History  . Marital status: Married    Spouse name: Not on file  . Number of children: 2  . Years of education: 79  . Highest education level: Not on file  Social Needs  . Financial resource strain: Not on file  . Food insecurity - worry: Not on file  . Food insecurity - inability: Not on file  . Transportation needs - medical: Not on file  . Transportation needs - non-medical: Not on file  Occupational History  . Occupation: Retired  Tobacco Use  . Smoking status: Never Smoker  . Smokeless tobacco: Never Used  Substance and Sexual Activity  . Alcohol use: No    Alcohol/week: 0.0 oz  . Drug use: No  . Sexual activity: Not on file  Other Topics Concern  . Not on file  Social History Narrative   Patient drinks about 3-4 cups of caffeine daily.   Patient is right handed.    Family History  Problem Relation Age of Onset  . Heart attack Mother   . Stroke Mother   . Diabetes Mother   . Heart disease Mother   . Colon polyps Mother   . Asthma Mother   . Breast cancer Maternal Grandmother   . Wilson's disease Maternal Grandmother   . Pancreatic cancer Maternal Grandfather   . Heart disease Father   . Heart attack Father   . Asthma Father   . Stroke Sister   . Hypertension Sister   . Rheum arthritis Sister   . Dementia Sister   . Asthma Sister   . Lupus Sister   . Asthma Sister     Outpatient Encounter Medications as of 11/04/2017  Medication Sig  . AMBULATORY NON FORMULARY MEDICATION Medication Name: One touch ultra strips. Check fasting blood sugar in the morning and as needed.  Dx - G9924 Fax to [phone]  .  atorvastatin (LIPITOR) 40 MG tablet Take 1 tablet (40 mg total) by mouth daily.  . calcium carbonate (OS-CAL) 600 MG TABS Take 600 mg by  mouth daily with breakfast.   . cetirizine (ZYRTEC) 10 MG tablet Take 10 mg by mouth as needed for allergies.  . Cyanocobalamin (VITAMIN B-12 IJ) Inject 1,000 mg as directed every 28 (twenty-eight) days.  Marland Kitchen denosumab (PROLIA) 60 MG/ML SOLN injection Inject 60 mg into the skin every 6 (six) months. Administer in upper arm, thigh, or abdomen  . donepezil (ARICEPT) 5 MG tablet Take 1 tablet (5 mg total) by mouth at bedtime.  . fesoterodine (TOVIAZ) 8 MG TB24 tablet Take 8 mg by mouth daily.  . furosemide (LASIX) 20 MG tablet Take 1 tablet (20 mg total) by mouth daily.  Marland Kitchen gabapentin (NEURONTIN) 300 MG capsule TAKE 1 TO 2 CAPSULES BY MOUTH UP TO TWO TIMES DAILY AS NEEDED  . ipratropium (ATROVENT) 0.03 % nasal spray Place 2 sprays into both nostrils every 12 (twelve) hours as needed for rhinitis.  Marland Kitchen leflunomide (ARAVA) 20 MG tablet TAKE 1 TABLET EVERY DAY  . Melatonin 5 MG TABS Take 5 mg by mouth at bedtime as needed.  . memantine (NAMENDA) 10 MG tablet Take 1 tablet (10 mg total) by mouth 2 (two) times daily.  . metFORMIN (GLUCOPHAGE-XR) 500 MG 24 hr tablet TAKE 1 TABLET EVERY DAY WITH BREAKFAST  . metoprolol tartrate (LOPRESSOR) 100 MG tablet Take 1 tablet (100 mg total) 2 (two) times daily by mouth.  . NON FORMULARY Place into the nose at bedtime. cpap machine  . ONE TOUCH ULTRA TEST test strip USE TO TEST BLOOD SUGAR ONCE DAILY  . pantoprazole (PROTONIX) 40 MG tablet Take 40 mg by mouth 2 (two) times daily.  . primidone (MYSOLINE) 50 MG tablet TAKE 1 TABLET AT BEDTIME  . Probiotic Product (PROBIOTIC ADVANCED PO) Take 1 capsule by mouth daily.  . propranolol (INDERAL) 10 MG tablet Take 1 tablet (10 mg total) by mouth 4 (four) times daily as needed.  . SUMAtriptan (IMITREX) 50 MG tablet Take 1 tablet (50 mg total) by mouth every 2 (two) hours as needed.  .  venlafaxine XR (EFFEXOR-XR) 75 MG 24 hr capsule TAKE 1 CAPSULE BY MOUTH  DAILY WITH BREAKFAST  . vitamin D, CHOLECALCIFEROL, 400 UNITS tablet Take 400 Units by mouth daily.   . [DISCONTINUED] amoxicillin-clavulanate (AUGMENTIN) 875-125 MG tablet Take 1 tablet by mouth 2 (two) times daily.  . [DISCONTINUED] cyclobenzaprine (FLEXERIL) 10 MG tablet TAKE 1/2 TO 1 TABLET BY MOUTH AT BEDTIME, GRADUALLY INCREASE TO 1 TAB 3 TIMES A DAY (Patient taking differently: as needed. )  . [DISCONTINUED] ondansetron (ZOFRAN-ODT) 4 MG disintegrating tablet Take 4 mg by mouth every 6 (six) hours as needed for nausea or vomiting.  . [EXPIRED] cyanocobalamin ((VITAMIN B-12)) injection 1,000 mcg    No facility-administered encounter medications on file as of 11/04/2017.          Objective:   Physical Exam  Constitutional: She is oriented to person, place, and time. She appears well-developed and well-nourished.  HENT:  Head: Normocephalic and atraumatic.  Cardiovascular: Normal rate, regular rhythm and normal heart sounds.  Pulmonary/Chest: Effort normal and breath sounds normal.  Neurological: She is alert and oriented to person, place, and time.  Skin: Skin is warm and dry.  Psychiatric: She has a normal mood and affect. Her behavior is normal.        Assessment & Plan:  Tremor, essential-stable we will continue to monitor.  HTN - Well controlled. Continue current regimen. Follow up in  6 months.   Pernicious anemia-B12 injection given today.  In regards  to driving safety I think if she wants to drive locally such as to the grocery store her doctor's office I think that that is reasonable but certainly I want her be very cautious about making sure that she is alert, and feeling strong enough to drive.  She also needs to be very cautious about medication side effects and that she there are several medications that she takes on an as-needed basis which can clearly impaired driving  ability.  Fibromyalgia-discussed that I really think her fatigue flares are probably related to her fibromyalgia.  I explained to her that it can fluctuate and flare for anywhere from a couple of days to even weeks at times and make it more difficult for her to function and may experience extreme fatigue during those times.  SVT-currently on metoprolol 100 mg.  She has not had any more low blood pressures since then.  Just encouraged her to continue to make sure she is hydrating well daily.  And she still uses propranolol as needed for rescue.  Cervical pain - work on stretches, heat and muscle relaxer as needed. Can refer to PT if not improving.

## 2017-11-04 NOTE — Progress Notes (Signed)
Pt received b12 injection in [facility]. Tolerated well. She will RTC in 1 month for next injection.Mia [name] [place]

## 2017-11-05 DIAGNOSIS — R350 Frequency of micturition: Secondary | ICD-10-CM | POA: Diagnosis not present

## 2017-11-05 DIAGNOSIS — N3941 Urge incontinence: Secondary | ICD-10-CM | POA: Diagnosis not present

## 2017-11-09 ENCOUNTER — Inpatient Hospital Stay: Admit: 2017-11-09 | Payer: MEDICARE | Primary: Internal Medicine

## 2017-11-09 ENCOUNTER — Encounter: Admit: 2017-11-09 | Discharge: 2017-11-09 | Payer: MEDICARE | Primary: Internal Medicine

## 2017-11-09 ENCOUNTER — Encounter: Attending: Internal Medicine | Primary: Internal Medicine

## 2017-11-09 DIAGNOSIS — R7301 Impaired fasting glucose: Secondary | ICD-10-CM

## 2017-11-10 LAB — METABOLIC PANEL, BASIC
Anion gap: 5 mmol/L (ref 3.0–18)
BUN/Creatinine ratio: 16 (ref 12–20)
BUN: 13 MG/DL (ref 7.0–18)
CO2: 29 mmol/L (ref 21–32)
Calcium: 8.8 MG/DL (ref 8.5–10.1)
Chloride: 106 mmol/L (ref 100–108)
Creatinine: 0.83 MG/DL (ref 0.6–1.3)
GFR est AA: >60 mL/min/{1.73_m2} (ref >60)
GFR est non-AA: >60 mL/min/{1.73_m2} (ref >60)
Glucose: 132 mg/dL — ABNORMAL HIGH (ref 74–99)
Potassium: 4.1 mmol/L (ref 3.5–5.5)
Sodium: 140 mmol/L (ref 136–145)

## 2017-11-10 LAB — HEMOGLOBIN A1C W/O EAG: Hemoglobin A1c: 6.1 % — ABNORMAL HIGH (ref 4.2–5.6)

## 2017-11-16 ENCOUNTER — Encounter: Attending: Internal Medicine | Primary: Internal Medicine

## 2017-11-22 ENCOUNTER — Telehealth: Payer: Self-pay | Admitting: Rheumatology

## 2017-11-22 NOTE — Telephone Encounter (Signed)
Patient left a voicemail stating that she needs prior authorization for her Mia [name] to lower the cost.  She uses [employer] order.  Her CB # [phone]

## 2017-11-23 ENCOUNTER — Telehealth: Payer: Self-pay

## 2017-11-23 ENCOUNTER — Ambulatory Visit: Admit: 2017-11-23 | Discharge: 2017-11-23 | Payer: MEDICARE | Attending: Internal Medicine | Primary: Internal Medicine

## 2017-11-23 ENCOUNTER — Ambulatory Visit: Attending: Internal Medicine | Primary: Internal Medicine

## 2017-11-23 DIAGNOSIS — Z0000 Encounter for general adult medical examination without abnormal findings: Secondary | ICD-10-CM

## 2017-11-23 NOTE — Telephone Encounter (Signed)
Received a prior authorization request from [facility] for [place]. Authorization was completed and faxed back. Will update once we receive a response.   Luccia Reinheimer, [employer], CPhT 9:34 AM

## 2017-11-23 NOTE — ACP (Advance Care Planning) (Signed)
Do you have an Advanced Directive? YES

## 2017-11-23 NOTE — Progress Notes (Signed)
67 y.o. white female who presents for f/u    No new issues from gyn standpoint    No gi complaints. She continues to postpone the colo but promises to reschedule    FBS <110, not checking pps.  Denies polyuria, polydipsia, nocturia, vision change. She tried the IF about 4 mos and did manage to lose 8 lbs but then her dad died and the holidays came along and she regained the weight back    Vitals 11/23/2017 05/11/2017 11/10/2016 08/25/2016 07/22/2016   Weight 166 lb 168 lb 168 lb 171 lb 9.6 oz 170 lb     bmi 28.5    No sx referable to the thyroid    LAST MEDICARE WELLNESS EXAM: 11/10/16, 11/23/17    IF 7/18    Past Medical History:   Diagnosis Date   ??? Allergic rhinitis    ??? Anxiety state, unspecified    ??? Arthritis     Dr. Synetta Shadow, Dr. Marisa Hua   ??? 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 Compression fx, thoracic spine ([place]) 2007    negative DEXA Dr. Marisa Hua   ??? Dyslipidemia    ??? Dyspepsia    ??? Frozen shoulder     right Dr. Wynonia Hazard MRI   ??? Left thyroid nodule 04/2016    1cm nodule; apparently noted on CT 2008 and unchanged   ??? Multiple lung nodules     no change 10/06, 03/07, 03/08   ??? Overweight (BMI 25.0-29.9)     IF 7/18 start weight 168 lbs   ??? Painless hematuria 04/2016    Dr Jimmye Norman, neg eval   ??? Palpitations 2008    neg thallium 2008, nl holter 2008, echo nl lv/ef 65%/tr mr/dd/nl pasp   ??? Prediabetes    ??? Syncope     neurocardiogenic by tilt 1994   ??? Venous insufficiency    ??? Vitamin D deficiency      Past Surgical History:   Procedure Laterality Date   ??? ECHO 2D ADULT  12/04    normal with EF 70%   ??? HX COLONOSCOPY      Dr Rosendo Gros 2007 negative   ??? HX GYN      s/p BTL    ??? HX HEMORRHOIDECTOMY      Dr. Rosendo Gros 2007   ??? HX HYSTERECTOMY  03/11/2017    Dr Sheral Flow   ??? HX ORTHOPAEDIC      DEXA -0.5 spine, -0.8 hip (1/18)   ??? HX UROLOGICAL  06/2016    Dr Jimmye Norman; bladder bx showed benign lesions    ??? STRESS TEST THALLIUM STUDY      2005 neg; 1/17 negative ef 75%   ??? [place] ABD COMP  9/05    negative   ??? VAS CAROTID DUPLEX BILATERAL  2004    negative     Social History     Socioeconomic History   ??? Marital status: MARRIED     Spouse name: Not on file   ??? Number of children: 2   ??? Years of education: Not on file   ??? Highest education level: Not on file   Social Needs   ??? Financial resource strain: Not on file   ??? Food insecurity - worry: Not on file   ??? Food insecurity - inability: Not on file   ??? Transportation needs - medical: Not on  file   ??? Transportation needs - non-medical: Not on file   Occupational History   ??? Occupation: benefits specialist   Tobacco Use   ??? Smoking status: Never Smoker   ??? Smokeless tobacco: Never Used   Substance and Sexual Activity   ??? Alcohol use: Yes     Alcohol/week: 2.4 oz     Types: 4 Glasses of wine per week   ??? Drug use: No   ??? Sexual activity: Not on file   Other Topics Concern   ??? Not on file   Social History Narrative   ??? Not on file     Current Outpatient Medications   Medication Sig   ??? lansoprazole (PREVACID) 30 mg capsule TAKE 1 CAPSULE BY MOUTH BEFORE BREAKFAST DAILY.   ??? metFORMIN (GLUCOPHAGE) 500 mg tablet Take 1 Tab by mouth two (2) times daily (with meals).   ??? Blood-Glucose Meter monitoring kit Use daily as directed   ??? glucose blood VI test strips (BLOOD GLUCOSE TEST) strip Use daily as directed   ??? lancets misc Use daily as directed   ??? ergocalciferol (VITAMIN D2) 50,000 unit capsule Take 50,000 Units by mouth every thirty (30) days.   ??? atorvastatin (LIPITOR) 40 mg tablet take 1 tablet by mouth once daily   ??? citalopram (CELEXA) 10 mg tablet take 1 tablet by mouth once daily   ??? omega-3 fatty acids (FISH OIL) cap Take 2 Tabs by mouth daily.   ??? multivitamin (ONE A DAY) tablet Take 1 Tab by mouth daily.   ??? aspirin 81 mg chewable tablet Take 81 mg by mouth daily.     No current facility-administered medications for this visit.      Allergies   Allergen Reactions    ??? Fish Oil Nausea Only   ??? Relafen [Nabumetone] Nausea Only     REVIEW OF SYSTEMS: gyn 4/18 Dr Andrew Au, Clotilde Dieter 3/18, colo 2007 Dr Rosendo Gros, DEXA 1/18 Dr Margaretann Loveless ??? no vision change or eye pain  Oral ??? no mouth pain, tongue or tooth problems  Ears ??? no hearing loss, ear pain, fullness, no swallowing problems  Cardiac ??? no CP, PND, orthopnea, edema, palpitations or syncope  Chest ??? no breast masses  Resp ??? no wheezing, chronic coughing, dyspnea  Urinary ??? no dysuria, hematuria, flank pain, urgency, frequency  Ortho ??? no swelling, dec ROM, myalgias  Psych ??? denies any anxiety or depression symptoms, no hallucinations or violent ideation  Endo - no polyuria, polydipsia, nocturia, hot flashes    Visit Vitals  BP 100/72   Pulse 80   Temp 98.9 ??F (37.2 ??C)   Resp 14   Ht 5' 4" (1.626 m)   Wt 166 lb (75.3 kg)   SpO2 98%   BMI 28.49 kg/m??   Affect is appropriate.  Mood stable  No apparent distress  HEENT --Anicteric sclerae.  No JVD, or bruits.  Thyroid fullness on left  Lungs --Clear to auscultation and percussion, normal percussion.  Heart --Regular rate and rhythm, no murmurs, rubs, gallops, or clicks.  Abdomen -- Soft and nontender, no hepatosplenomegaly or masses.  Extremities -- Without cyanosis, clubbing, edema. 2+ pulses equally and bilaterally.    LABS  From 5/10 showed   gluc 110, cr 0.70,               alt 23,  chol 154, tg 143, hdl 45, ldl-c 80,                                                            tsh 1.91  From 5/10 showed                      2 hr GTT 89  From 8/10 showed                                                               vit d 23.0, ck 57, aldo 5.4  From 8/11 showed   gluc 108,                                     hba1c 6.3,                   chol 160, tg 172, hdl 39, ldl-c 87,  wbc 5.,7 hb 12.3, plt 235, ua neg,     tsh 2.28  From 5/12 showed   gluc 113, cr 0.71, gfr 94,  alt 16, hba1c 6.2, ldl-p 1989, chol 177, tg 161, hdl 43, ldl-c 102   From 11/12 showed                                                     hba1c 5.9, ldl-p 1218, chol 133, tg 116, hdl 45, ldl-c 65  From 5/13 showed   gluc 105, cr 0.55, gfr 102, alt 8,  hba1c 6.2,                   chol 148, tg 107, hdl 45, ldl-c 82,  wbc 5.0, hb 12.5, plt 172, vit d 40.3  From 11/13 showed        hba1c 6.2, ldl-p 1888, chol 176, tg 155, hdl 49, ldl-c 86,  wbc 6.2, hb 12.3, plt 182, vit d 34.4  From 5/14 showed        hba1c 6.2,     chol 152, tg 157, hdl 39, ldl-c 82  From 1/15 showed   gluc 113, cr 0.68, gfr>60, alt 11, hba1c 6.2,     chol 146, tg 130, hdl 43, ldl-c 77  From 7/15 showed        hba1c 6.3,     chol 133, tg 124, hdl 39, ldl-c 69, wbc 6.3, hb 12.2, plt 193  From 6/16 showed   gluc 106, cr 0.74, gfr>60, alt 26, hba1c 6.3,     chol 126, tg 125, hdl 46, ldl-c 55, wbc 5.7, hb 13.2, plt 203, vit d 7.2,   tsh 1.69  From 12/16 showed gluc 110, cr 0.68, gfr>60, alt 30, hba1c 6.1,     chol 135, tg 147, hdl 47, ldl-c 59, wbc 6.0, hb 12.7, plt 197  From 6/17 showed  gluc 122, cr 0.71, gfr>60, alt 33, hba1c 6.2,     chol 153, tg 140, hdl 46, ldl-c 79, wbc 6.3, hb 13.1, plt 190,        tsh 1.92, hep c neg  From 1/18 showed        hba1c 6.2,    chol 154, tg 181, hdl 41, ldl-c 77  From 7/18 showed   gluc 129, cr 0.63, gfr>60, alt 26, hba1c 6.1,              wbc 6.0, hb 12.4, plt 193, vit d 79.0    Results for orders placed or performed during the hospital encounter of 75/91/63   METABOLIC PANEL, BASIC   Result Value Ref Range    Sodium 140 136 - 145 mmol/L    Potassium 4.1 3.5 - 5.5 mmol/L    Chloride 106 100 - 108 mmol/L    CO2 29 21 - 32 mmol/L    Anion gap 5 3.0 - 18 mmol/L    Glucose 132 (H) 74 - 99 mg/dL    BUN 13 7.0 - 18 MG/DL    Creatinine 0.83 0.6 - 1.3 MG/DL    BUN/Creatinine ratio 16 12 - 20      GFR est AA >60 >60 ml/min/1.26m    GFR est non-AA >60 >60 ml/min/1.738m   Calcium 8.8 8.5 - 10.1 MG/DL   HEMOGLOBIN A1C W/O EAG   Result Value Ref Range    Hemoglobin A1c 6.1 (H) 4.2 - 5.6 %      Patient Active Problem List   Diagnosis Code   ??? Vitamin D deficiency E55.9   ??? Anxiety F41.9   ??? Dyslipidemia E78.5   ??? 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 Overweight (BMI 25.0-29.9) E66.3   ??? Controlled type 2 diabetes mellitus, without long-term current use of insulin (HCC) E11.9     Assessment and plan:  1. Prediabetes. Lifestyle and dietary modification reiterated. Weight loss advocated.   2. Hyperlipidemia. Continue current  3. Hypovitaminosis D. Dec to monthly supplementation  4. Anxiety.  Continue celexa  5. Overweight.  Dec app suppressants, med supervised wt loss, nutrition/dietician in the past.  Will send for diabetic teaching.  She will reconsider IF in future  6. Colo to be scheduled by her w Dr RaRosendo Gros7. Endometrial ca.  Per Dr SqSheral Flow8. DM.  sched for teaching as above.  Start metformin after discussing possible sfx.  Start checking sugars, ophth consult, f/u 3-4 mos        RTC 5/19    Above conditions discussed at length and patient vocalized understanding.  All questions answered to patient satisfaction      TOTAL time 40 min spent of which at least 30 min was on counseling, answering questions and coordination of care

## 2017-11-23 NOTE — Progress Notes (Signed)
1. Have you been to the ER, urgent care clinic or hospitalized since your last visit? NO.     2. Have you seen or consulted any other health care providers outside of the [facility] since your last visit (Include any pap smears or colon screening)? NO          Chief Complaint   Patient presents with   ??? Cholesterol Problem     6 month follow up with labs

## 2017-11-23 NOTE — ACP (Advance Care Planning) (Signed)
Advance Care Planning    Advance Care Planning (ACP) Provider Note - Comprehensive     Date of ACP Conversation: 11/24/17  Persons included in Conversation:  patient  Length of ACP Conversation in minutes:  16 minutes    Authorized Media planner (if patient is incapable of making informed decisions):   This person is: sister Loss adjuster, chartered (e.g. Next of Kin)          General ACP for ALL Patients with Decision Making Capacity:   Importance of advance care planning, including choosing a healthcare agent to communicate patient's healthcare decisions if patient lost the ability to make decisions, such as after a sudden illness or accident  Understanding of the healthcare agent role was assessed and information provided  Exploration of values, goals, and preferences if recovery is not expected, even with continued medical treatment in the event of: Imminent death  Severe, permanent brain injury    Review of Existing Advance Directive:  na    For Serious or Chronic Illness:  Understanding of medical condition    Understanding of CPR, goals and expected outcomes, benefits and burdens discussed.    Interventions Provided:  Recommended completion of Advance Directive form after review of ACP materials and conversation with prospective healthcare agent   Recommended communicating the plan and making copies for the healthcare agent, personal physician, and others as appropriate (e.g., health system)  Recommended review of completed ACP document annually or upon change in health status

## 2017-11-23 NOTE — Progress Notes (Signed)
This is a subsequent medicare annual welness exam    I have reviewed the patient's medical history in detail and updated the computerized patient record.     History     Past Medical History:   Diagnosis Date   ??? Allergic rhinitis    ??? Anxiety state, unspecified    ??? Arthritis     Dr. Synetta Shadow, Dr. Marisa Hua   ??? 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 Compression fx, thoracic spine ([place]) 2007    negative DEXA Dr. Marisa Hua   ??? Dyslipidemia    ??? Dyspepsia    ??? Frozen shoulder     right Dr. Wynonia Hazard MRI   ??? Left thyroid nodule 04/2016    1cm nodule; apparently noted on CT 2008 and unchanged   ??? Multiple lung nodules     no change 10/06, 03/07, 03/08   ??? Overweight (BMI 25.0-29.9)     IF 7/18 start weight 168 lbs   ??? Painless hematuria 04/2016    Dr Jimmye Norman, neg eval   ??? Palpitations 2008    neg thallium 2008, nl holter 2008, echo nl lv/ef 65%/tr mr/dd/nl pasp   ??? Prediabetes    ??? Syncope     neurocardiogenic by tilt 1994   ??? Venous insufficiency    ??? Vitamin D deficiency       Past Surgical History:   Procedure Laterality Date   ??? ECHO 2D ADULT  12/04    normal with EF 70%   ??? HX COLONOSCOPY      Dr Rosendo Gros 2007 negative   ??? HX GYN      s/p BTL    ??? HX HEMORRHOIDECTOMY      Dr. Rosendo Gros 2007   ??? HX HYSTERECTOMY  03/11/2017    Dr Sheral Flow   ??? HX ORTHOPAEDIC      DEXA -0.5 spine, -0.8 hip (1/18)   ??? HX UROLOGICAL  06/2016    Dr Jimmye Norman; bladder bx showed benign lesions   ??? STRESS TEST THALLIUM STUDY      2005 neg; 1/17 negative ef 75%   ??? [place] ABD COMP  9/05    negative   ??? VAS CAROTID DUPLEX BILATERAL  2004    negative     Current Outpatient Medications   Medication Sig Dispense Refill   ??? lansoprazole (PREVACID) 30 mg capsule TAKE 1 CAPSULE BY MOUTH BEFORE BREAKFAST DAILY. 90 Cap 3   ??? metFORMIN (GLUCOPHAGE) 500 mg tablet Take 1 Tab by mouth two (2) times daily (with meals). 60 Tab 11    ??? Blood-Glucose Meter monitoring kit Use daily as directed 1 Kit 0   ??? glucose blood VI test strips (BLOOD GLUCOSE TEST) strip Use daily as directed 100 Strip 11   ??? lancets misc Use daily as directed 100 Each 11   ??? ergocalciferol (VITAMIN D2) 50,000 unit capsule Take 50,000 Units by mouth every thirty (30) days.     ??? atorvastatin (LIPITOR) 40 mg tablet take 1 tablet by mouth once daily 90 Tab 3   ??? citalopram (CELEXA) 10 mg tablet take 1 tablet by mouth once daily 90 Tab 3   ??? omega-3 fatty acids (FISH OIL) cap Take 2 Tabs by mouth daily.     ??? multivitamin (ONE A DAY) tablet Take 1 Tab by mouth daily.     ??? aspirin  81 mg chewable tablet Take 81 mg by mouth daily.       Allergies   Allergen Reactions   ??? Fish Oil Nausea Only   ??? Relafen [Nabumetone] Nausea Only     Family History   Problem Relation Age of Onset   ??? Diabetes Mother    ??? Elevated Lipids Father    ??? Stroke Paternal Grandmother    ??? Heart Disease Paternal Grandfather      Social History     Tobacco Use   ??? Smoking status: Never Smoker   ??? Smokeless tobacco: Never Used   Substance Use Topics   ??? Alcohol use: Yes     Alcohol/week: 2.4 oz     Types: 4 Glasses of wine per week     Depression Risk Factor Screening:     PHQ over the last two weeks 11/23/2017   Little interest or pleasure in doing things Not at all   Feeling down, depressed, irritable, or hopeless Not at all   Total Score PHQ 2 0     SCREENINGS  Colonoscopy last done 2007 Dr Rosendo Gros  Mammogram last done 3/18  DEXA last done 1/18  Gyn last done >5 yrs     Immunization History   Administered Date(s) Administered   ??? Influenza High Dose Vaccine PF 09/07/2016   ??? Influenza Vaccine 07/27/2011, 07/26/2014, 07/27/2015   ??? Influenza Vaccine (Tri) Adjuvanted 08/04/2017   ??? Influenza Vaccine PF 08/25/2013   ??? Influenza Vaccine Whole 09/14/2008   ??? Pneumococcal Conjugate (PCV-13) 11/10/2016   ??? Pneumococcal Polysaccharide (PPSV-23) 11/23/2017   ??? TDAP Vaccine 02/09/2011    ??? Zoster Vaccine, Live 03/22/2012     Alcohol Risk Factor Screening:   You do not drink alcohol or very rarely.    Functional Ability and Level of Safety:     Hearing Loss  Hearing is good.    Activities of Daily Living  The home contains: no safety equipment.  Patient does total self care    Fall Risk  Fall Risk Assessment, last 12 mths 11/23/2017   Able to walk? Yes   Fall in past 12 months? No       Abuse Screen  Patient is not abused    Cognitive Screening   Evaluation of Cognitive Function:  Has your family/caregiver stated any concerns about your memory: no  Normal    Patient Care Team   Patient Care Team:  Albin Felling, MD as PCP - General (Internal Medicine)    Assessment/Plan   Education and counseling provided:  Are appropriate based on today's review and evaluation  End-of-Life planning (with patient's consent)  Pneumococcal Vaccine  Influenza Vaccine  Screening Mammography  Colorectal cancer screening tests  Cardiovascular screening blood test  Bone mass measurement (DEXA)  Diabetes screening test    Diagnoses and all orders for this visit:    1. Medicare annual wellness visit, subsequent    2. Abdominal pain  -     lansoprazole (PREVACID) 30 mg capsule; TAKE 1 CAPSULE BY MOUTH BEFORE BREAKFAST DAILY.    3. Encounter for immunization  -     PNEUMOCOCCAL POLYSACCHARIDE VACCINE, 23-VALENT, ADULT OR IMMUNOSUPPRESSED PT DOSE,  -     ADMIN INFLUENZA VIRUS VAC    4. Vitamin D deficiency  -     VITAMIN D, 25 HYDROXY; Future    5. Overweight (BMI 25.0-29.9)    6. Dyslipidemia    7. Controlled type 2 diabetes mellitus without complication, without  long-term current use of insulin ([place])  -     REFERRAL TO DIABETIC EDUCATION  -     HEMOGLOBIN A1C W/O EAG; Future  -     MICROALBUMIN, UR, RAND W/ MICROALB/CREAT RATIO; Future  -     LIPID PANEL; Future  -     metFORMIN (GLUCOPHAGE) 500 mg tablet; Take 1 Tab by mouth two (2) times daily (with meals).   -     Blood-Glucose Meter monitoring kit; Use daily as directed  -     glucose blood VI test strips (BLOOD GLUCOSE TEST) strip; Use daily as directed  -     lancets misc; Use daily as directed    8. Osteoarthritis, unspecified osteoarthritis type, unspecified site    9. Anxiety    10. Encounter for screening colonoscopy  -     REFERRAL TO GENERAL SURGERY    11. Advanced directives, counseling/discussion  -     ADVANCE CARE PLANNING FIRST 30 MINS    12. Screening for alcoholism  -     PR ANNUAL ALCOHOL SCREEN 15 MIN    13. Screening for depression  -     [place]    14. Screen for colon cancer  -     REFERRAL FOR COLONOSCOPY    15. Screening for ischemic heart disease         Health Maintenance Due   Topic Date Due   ??? Shingrix Vaccine Age 85> (1 of 2) 08/29/2001   ??? COLONOSCOPY  05/14/2016   ??? GLAUCOMA SCREENING Q2Y  08/29/2016   ??? MEDICARE YEARLY EXAM  11/11/2017

## 2017-11-23 NOTE — Patient Instructions (Addendum)
Vaccine Information Statement    Pneumococcal Polysaccharide Vaccine: What You Need to Know    Many Vaccine Information Statements are available in Spanish and other languages. See www.immunize.org/vis.  Hojas de informaci??n Sobre Vacunas est??n disponibles en espa??ol y en muchos otros idiomas. Visite http://www.immunize.org/vis.    1. Why get vaccinated?    Vaccination can protect older adults (and some children and younger adults) from pneumococcal disease.    Pneumococcal disease is caused by bacteria that can spread from person to person through close contact.  It can cause ear infections, and it can also lead to more serious infections of the:  ??? Lungs (pneumonia),  ??? Blood (bacteremia), and  ??? Covering of the brain and spinal cord (meningitis). Meningitis can cause deafness and brain damage, and it can be fatal.      Anyone can get pneumococcal disease, but children under 2 years of age, people with certain medical conditions, adults over 65 years of age, and cigarette smokers are at the highest risk.    About 18,000 older adults die each year from pneumococcal disease in the [place].    Treatment of pneumococcal infections with penicillin and other drugs used to be more effective. But some strains of the disease have become resistant to these drugs. This makes prevention of the disease, through vaccination, even more important.    2. Pneumococcal polysaccharide vaccine (PPSV23)    Pneumococcal polysaccharide vaccine (PPSV23) protects against 23 types of pneumococcal bacteria. It will not prevent all pneumococcal disease.    PPSV23 is recommended for:  ??? All adults 65 years of age and older,  ??? Anyone 2 through 67 years of age with certain long-term health problems,  ??? Anyone 2 through 67 years of age with a weakened immune system,  ??? Adults 19 through 67 years of age who smoke cigarettes or have asthma.     Most people need only one dose of PPSV.  A second dose is recommended for  certain high-risk groups.  People 65 and older should get a dose even if they have gotten one or more doses of the vaccine before they turned 65.    Your healthcare provider can give you more information about these recommendations.    Most healthy adults develop protection within 2 to 3 weeks of getting the shot.     3. Some people should not get this vaccine    ??? Anyone who has had a life-threatening allergic reaction to PPSV should not get another dose.    ??? Anyone who has a severe allergy to any component of PPSV should not receive it. Tell your provider if you have any severe allergies.    ??? Anyone who is moderately or severely ill when the shot is scheduled may be asked to wait until they recover before getting the vaccine. Someone with a mild illness can usually be vaccinated.    ??? Children less than 2 years of age should not receive this vaccine.    ??? There is no evidence that PPSV is harmful to either a pregnant woman or to her fetus. However, as a precaution, women who need the vaccine should be vaccinated before becoming pregnant, if possible.    4. Risks of a vaccine reaction    With any medicine, including vaccines, there is a chance of side effects. These are usually mild and go away on their own, but serious reactions are also possible.     About half of people who get PPSV   have mild side effects, such as redness or pain where the shot is given, which go away within about two days.    Less than 1 out of 100 people develop a fever, muscle aches, or more severe local reactions.    Problems that could happen after any vaccine:    ??? People sometimes faint after a medical procedure, including vaccination. Sitting or lying down for about 15 minutes can help prevent fainting, and injuries caused by a fall. Tell your doctor if you feel dizzy, or have vision changes or ringing in the ears.    ??? Some people get severe pain in the shoulder and have difficulty moving  the arm where a shot was given. This happens very rarely.    ??? Any medication can cause a severe allergic reaction. Such reactions from a vaccine are very rare, estimated at about 1 in a million doses, and would happen within a few minutes to a few hours after the vaccination.     As with any medicine, there is a very remote chance of a vaccine causing a serious injury or death.    The safety of vaccines is always being monitored.  For more information, visit: http://www.aguilar.org/     5. What if there is a serious reaction?    What should I look for?    Look for anything that concerns you, such as signs of a severe allergic reaction, very high fever, or unusual behavior.    Signs of a severe allergic reaction can include hives, swelling of the face and throat, difficulty breathing, a fast heartbeat, dizziness, and weakness. These would usually start a few minutes to a few hours after the vaccination.    What should I do?    If you think it is a severe allergic reaction or other emergency that can???t wait, call 9-1-1 or get to the nearest hospital. Otherwise, call your doctor.    Afterward, the reaction should be reported to the Vaccine Adverse Event Reporting System (VAERS). Your doctor might file this report, or you can do it yourself through the VAERS web site at www.vaers.SamedayNews.es, or by calling [phone].     VAERS does not give medical advice.    6. How can I learn more?    ??? Ask your doctor. He or she can give you the vaccine package insert or suggest other sources of information.  ??? Call your local or state health department.  ??? Contact the Centers for Disease Control and Prevention (CDC):  - Call 603 107 5987 (1-800-CDC-INFO) or  - Visit CDC???s website at http://hunter.com/    [place]   02/16/2014    Department of Health and [employer] for Disease Control and Prevention    Office Use Only  [facility]      Diabetic teaching to be scheduled   Ask about diabetic classes at [facility] metformin  Will call in machine/lancets/strips - check daily fasting/2 hrs after meals  Schedule colonoscopy Dr Rosendo Gros  Talk to pharmacist about new shingles vaccine/SHINGRIX  F/u ov and labs 3 mos  Medicare Wellness Visit, Female     The best way to live healthy is to have a lifestyle where you eat a well-balanced diet, exercise regularly, limit alcohol use, and quit all forms of tobacco/nicotine, if applicable.   Regular preventive services are another way to keep healthy. Preventive services (vaccines, screening tests, monitoring & exams) can help personalize your care plan, which helps you manage  your own care. Screening tests can find health problems at the earliest stages, when they are easiest to treat.   [facility] follows the current, evidence-based guidelines published by the [place] [employer] (USPSTF) when recommending preventive services for our patients. Because we follow these guidelines, sometimes recommendations change over time as research supports it. (For example, mammograms used to be recommended annually. Even though Medicare will still pay for an annual mammogram, the newer guidelines recommend a mammogram every two years for women of average risk.)  Of course, you and your doctor may decide to screen more often for some diseases, based on your risk and your health status.   Preventive services for you include:  - Medicare offers their members a free annual wellness visit, which is time for you and your primary care provider to discuss and plan for your preventive service needs. Take advantage of this benefit every year!  -All adults over the age of 72 should receive the recommended pneumonia vaccines. Current USPSTF guidelines recommend a series of two vaccines for the best pneumonia protection.   -All adults should have a flu vaccine yearly and a tetanus vaccine every  10 years. All adults age 14 and older should receive a shingles vaccine once in their lifetime.    -A bone mass density test is recommended when a woman turns 65 to screen for osteoporosis. This test is only recommended one time, as a screening. Some providers will use this same test as a disease monitoring tool if you already have osteoporosis.  -All adults age 56-70 who are overweight should have a diabetes screening test once every three years.   -Other screening tests and preventive services for persons with diabetes include: an eye exam to screen for diabetic retinopathy, a kidney function test, a foot exam, and stricter control over your cholesterol.   -Cardiovascular screening for adults with routine risk involves an electrocardiogram (ECG) at intervals determined by your doctor.   -Colorectal cancer screenings should be done for adults age 54-75 with no increased risk factors for colorectal cancer.  There are a number of acceptable methods of screening for this type of cancer. Each test has its own benefits and drawbacks. Discuss with your doctor what is most appropriate for you during your annual wellness visit. The different tests include: colonoscopy (considered the best screening method), a fecal occult blood test, a fecal DNA test, and sigmoidoscopy.  -Breast cancer screenings are recommended every other year for women of normal risk, age 53-74.  -Cervical cancer screenings for women over age 13 are only recommended with certain risk factors.   -All adults born between [place] should be screened once for Hepatitis C.     Here is a list of your current Health Maintenance items (your personalized list of preventive services) with a due date:  Health Maintenance Due   Topic Date Due   ??? Shingles Vaccine (1 of 2) 08/29/2001   ??? Colonoscopy  05/14/2016   ??? Glaucoma Screening   08/29/2016   ??? Annual Well Visit  11/11/2017

## 2017-11-23 NOTE — Progress Notes (Signed)
Laura Roman is a 67 y.o. female who presents for routine immunizations. Per VO from [facility]. PPSV 23 given in right deltoid.   She denies any symptoms , reactions or allergies that would exclude them from being immunized today.  Risks and adverse reactions were discussed and the VIS was given to them. All questions were addressed.  She was observed for 15 min post injection. There were no reactions observed.    Shawna Clamp, LPN

## 2017-11-24 MED ORDER — BLOOD GLUCOSE METER KIT
PACK | 0 refills | Status: AC
Start: 2017-11-24 — End: ?

## 2017-11-24 MED ORDER — LANSOPRAZOLE 30 MG CAP, DELAYED RELEASE
30 mg | ORAL_CAPSULE | ORAL | 3 refills | Status: DC
Start: 2017-11-24 — End: 2018-11-14

## 2017-11-24 MED ORDER — METFORMIN 500 MG TAB
500 mg | ORAL_TABLET | Freq: Two times a day (BID) | ORAL | 11 refills | Status: DC
Start: 2017-11-24 — End: 2018-02-22

## 2017-11-24 MED ORDER — LANCETS
11 refills | Status: AC
Start: 2017-11-24 — End: ?

## 2017-11-24 MED ORDER — BLOOD SUGAR DIAGNOSTIC TEST STRIPS
ORAL_STRIP | 11 refills | Status: AC
Start: 2017-11-24 — End: ?

## 2017-11-24 NOTE — Telephone Encounter (Signed)
Called [facility] and spoke to Laura Roman to advise message below and she verbalized understanding and didn't have any additional questions.

## 2017-11-24 NOTE — Telephone Encounter (Signed)
Once daily

## 2017-11-24 NOTE — Telephone Encounter (Signed)
[facility] pharmacy called wanting to know how many time to check patient glucose in a day?

## 2017-11-25 ENCOUNTER — Ambulatory Visit (INDEPENDENT_AMBULATORY_CARE_PROVIDER_SITE_OTHER): Payer: Medicare Other | Admitting: Neurology

## 2017-11-25 ENCOUNTER — Encounter: Payer: Self-pay | Admitting: Neurology

## 2017-11-25 VITALS — BP 120/68 | HR 68 | Ht 66.0 in | Wt 173.0 lb

## 2017-11-25 DIAGNOSIS — G3184 Mild cognitive impairment, so stated: Secondary | ICD-10-CM

## 2017-11-25 DIAGNOSIS — M797 Fibromyalgia: Secondary | ICD-10-CM

## 2017-11-25 HISTORY — DX: Mild cognitive impairment of uncertain or unknown etiology: G31.84

## 2017-11-25 NOTE — Progress Notes (Signed)
Reason for visit: Memory disorder  Mia [name] is an 67 y.o. female  History of present illness:  Mia [name] is a 67 year old right-handed white female with a history of a mild memory disorder.  The patient has been relatively stable over time with her memory.  She continues to have word finding problems and mild forgetfulness.  She continues to complain of some troubles with her balance, this has been a problem for several years.  She has not had any falls, she will stagger on occasion.  The patient does have some neck pain, she had a recent MRI of the cervical spine and lumbar spine.  She has been told that she has bilateral carpal tunnel syndrome and that she will need to have surgery for this and she may need a left total knee replacement.  The patient does have some left knee and leg discomfort.  She has weakness in the legs.  She has recently had episodes of increased heart rate, her Toprol dose was increased.  She does have a head tremor that is a side to side tremor.  She claims that in the past she was given a diagnosis of primary lateral sclerosis, there is no evidence of this on clinical examination.  Past Medical History:  Diagnosis Date  . Anal fissure   . Anemia   . Arthritis   . Blood transfusion   . C. difficile colitis   . Chest pain    a. 01/2013 MV: EF 59%, no ischemia.  . Chronic Dyspnea    a. 01/2013 Echo: EF 60-65%, Gr 1 DD, PASP 67mmHg.  . Diabetes mellitus   . Fibromyalgia   . Gall stones   . GERD (gastroesophageal reflux disease)    gastritis  . Hemorrhoids   . Hypertension   . Interstitial cystitis   . MCI (mild cognitive impairment) 11/25/2017  . Memory difficulty 12/14/2013  . Neuromuscular disorder ([place])    sclerosis  . Osteoporosis   . Peptic ulcer   . PONV (postoperative nausea and vomiting)   . Pseudogout   . Raynaud phenomenon   . Rectal bleeding   . Sleep apnea    a. on cpap.  Marland Kitchen SVT (supraventricular tachycardia) ([place])   . Syncope 09/10/2015    . Thyroid disease    hypothyroidism  . Tremor, essential 05/14/2016    Past Surgical History:  Procedure Laterality Date  . APPENDECTOMY    . BLADDER SURGERY     x2  . BLADDER SURGERY    . BREAST BIOPSY    . CARDIAC CATHETERIZATION    . CATARACT EXTRACTION Bilateral   . CHOLECYSTECTOMY    . DILATION AND CURETTAGE OF UTERUS    . ENTEROCELE REPAIR     x2  . EYE SURGERY     retina  . hysterectomy - unknown type    . KNEE ARTHROPLASTY    . KNEE SURGERY     x6  . NECK SURGERY     fusion  . OOPHORECTOMY    . QUADRICEPS REPAIR Right   . RECTOCELE REPAIR     x2  . SHOULDER ARTHROSCOPY WITH SUBACROMIAL DECOMPRESSION, ROTATOR CUFF REPAIR AND BICEP TENDON REPAIR  10/06/2012   Procedure: SHOULDER ARTHROSCOPY WITH SUBACROMIAL DECOMPRESSION, ROTATOR CUFF REPAIR AND BICEP TENDON REPAIR;  Surgeon: Nita Sells, MD;  Location: [facility];  Service: Orthopedics;  Laterality: Right;  Arthroscopic  Repair  of  Subscapularis, Open Biceps Tenodesis  . SHOULDER SURGERY  bilateral- bones spur  . TONSILLECTOMY    . TOTAL KNEE ARTHROPLASTY    . TOTAL SHOULDER ARTHROPLASTY      Family History  Problem Relation Age of Onset  . Heart attack Mother   . Stroke Mother   . Diabetes Mother   . Heart disease Mother   . Colon polyps Mother   . Asthma Mother   . Breast cancer Maternal Grandmother   . Wilson's disease Maternal Grandmother   . Pancreatic cancer Maternal Grandfather   . Heart disease Father   . Heart attack Father   . Asthma Father   . Stroke Sister   . Hypertension Sister   . Rheum arthritis Sister   . Dementia Sister   . Asthma Sister   . Lupus Sister   . Asthma Sister     Social history:  reports that  has never smoked. she has never used smokeless tobacco. She reports that she does not drink alcohol or use drugs.    Allergies  Allergen Reactions  . Myrbetriq  [Mirabegron] Other (See Comments)    SEVERE HEADACHE  . Percocet  [Oxycodone-Acetaminophen] Nausea And Vomiting  . Celebrex [Celecoxib] Other (See Comments)    Other reaction(s): Other flushed  . Codeine Nausea And Vomiting  . Darvocet [Propoxyphene N-Acetaminophen] Nausea And Vomiting  . Erythromycin Nausea And Vomiting    Nausea and vomiting   . Hydrocodone Nausea And Vomiting  . Lyrica [Pregabalin] Swelling    Legs swelling   . Macrodantin [Nitrofurantoin] Nausea And Vomiting  . Toradol [Ketorolac Tromethamine] Nausea And Vomiting    Per patient, only PO form causes nausea and vomiting. Can take injection without issue  . Tramadol Nausea And Vomiting  . Verapamil Nausea And Vomiting    REACTION: intolerance    Medications:  Prior to Admission medications   Medication Sig Start Date End Date Taking? Authorizing Provider  AMBULATORY NON FORMULARY MEDICATION Medication Name: One touch ultra strips. Check fasting blood sugar in the morning and as needed.  Dx - [id number] Fax to [phone] 02/04/17  Yes Hali Marry, MD  atorvastatin (LIPITOR) 40 MG tablet Take 1 tablet (40 mg total) by mouth daily. 06/21/17  Yes Nahser, Wonda Cheng, MD  calcium carbonate (OS-CAL) 600 MG TABS Take 600 mg by mouth daily with breakfast.    Yes [provider]  cetirizine (ZYRTEC) 10 MG tablet Take 10 mg by mouth as needed for allergies.   Yes [provider]  Cyanocobalamin (VITAMIN B-12 IJ) Inject 1,000 mg as directed every 28 (twenty-eight) days.   Yes [provider]  denosumab (PROLIA) 60 MG/ML SOLN injection Inject 60 mg into the skin every 6 (six) months. Administer in upper arm, thigh, or abdomen   Yes [provider]  donepezil (ARICEPT) 5 MG tablet Take 1 tablet (5 mg total) by mouth at bedtime. 03/01/17  Yes Begley Givens, NP  fesoterodine (TOVIAZ) 8 MG TB24 tablet Take 8 mg by mouth daily.   Yes [provider]  furosemide (LASIX) 20 MG tablet Take 1 tablet (20 mg total) by mouth daily. 09/28/16  Yes Nahser,  Wonda Cheng, MD  gabapentin (NEURONTIN) 300 MG capsule TAKE 1 TO 2 CAPSULES BY MOUTH UP TO TWO TIMES DAILY AS NEEDED 09/21/17  Yes Hali Marry, MD  ipratropium (ATROVENT) 0.03 % nasal spray Place 2 sprays into both nostrils every 12 (twelve) hours as needed for rhinitis.   Yes [provider]  leflunomide (ARAVA) 20 MG tablet TAKE 1  TABLET EVERY DAY 11/03/17  Yes Deveshwar, Abel Presto, MD  Melatonin 5 MG TABS Take 5 mg by mouth at bedtime as needed.   Yes [provider]  memantine (NAMENDA) 10 MG tablet Take 1 tablet (10 mg total) by mouth 2 (two) times daily. 12/14/16  Yes Signore Givens, NP  metFORMIN (GLUCOPHAGE-XR) 500 MG 24 hr tablet TAKE 1 TABLET EVERY DAY WITH BREAKFAST 08/17/17  Yes Hali Marry, MD  metoprolol tartrate (LOPRESSOR) 100 MG tablet Take 1 tablet (100 mg total) 2 (two) times daily by mouth. 09/10/17  Yes Nahser, Wonda Cheng, MD  NON FORMULARY Place into the nose at bedtime. cpap machine   Yes [provider]  ONE TOUCH ULTRA TEST test strip USE TO TEST BLOOD SUGAR ONCE DAILY 02/04/17  Yes Hali Marry, MD  pantoprazole (PROTONIX) 40 MG tablet Take 40 mg by mouth 2 (two) times daily.   Yes [provider]  primidone (MYSOLINE) 50 MG tablet TAKE 1 TABLET AT BEDTIME 09/15/17  Yes Hali Marry, MD  Probiotic Product (PROBIOTIC ADVANCED PO) Take 1 capsule by mouth daily.   Yes [provider]  propranolol (INDERAL) 10 MG tablet Take 1 tablet (10 mg total) by mouth 4 (four) times daily as needed. 08/19/17  Yes Nahser, Wonda Cheng, MD  SUMAtriptan (IMITREX) 50 MG tablet Take 1 tablet (50 mg total) by mouth every 2 (two) hours as needed. 07/08/16  Yes Hali Marry, MD  venlafaxine XR (EFFEXOR-XR) 75 MG 24 hr capsule TAKE 1 CAPSULE BY MOUTH  DAILY WITH BREAKFAST 03/01/17  Yes Zentz Givens, NP  vitamin D, CHOLECALCIFEROL, 400 UNITS tablet Take 400 Units by mouth daily.    Yes [provider]     ROS:  Out of a complete 14 system review of symptoms, the patient complains only of the following symptoms, and all other reviewed systems are negative.  Heat intolerance, excessive thirst Sleep apnea Incontinence of the bladder, frequency of urination, urinary urgency Joint pain, joint swelling, back pain, aching muscles, walking difficulty Memory loss, numbness, weakness, tremors  Blood pressure 120/68, pulse 68, height 5\' 6"  (1.676 m), weight 173 lb (78.5 kg).  Physical Exam  General: The patient is alert and cooperative at the time of the examination.  Skin: No significant peripheral edema is noted.   Neurologic Exam  Mental status: The patient is alert and oriented x 3 at the time of the examination. The patient has apparent normal recent and remote memory, with an apparently normal attention span and concentration ability.  Mini-Mental status examination done today shows a total score of 27/30.   Cranial nerves: Facial symmetry is present. Speech is normal, no aphasia or dysarthria is noted. Extraocular movements are full. Visual fields are full.  Motor: The patient has good strength in all 4 extremities, with exception some weakness with hip flexion bilaterally, there appears to be some giveaway component to the weakness.  Sensory examination: Soft touch sensation is symmetric on the face, arms, and legs.  No stocking pattern pinprick sensory deficit is noted in the legs, position sense is intact in both feet.  Coordination: The patient has good finger-nose-finger and heel-to-shin bilaterally.  Gait and station: The patient has a slightly unsteady gait, she takes good stride and good turns, she may veer at times with walking.  The patient is unsteady with Romberg, she is unable to perform tandem gait.  Reflexes: Deep tendon reflexes are symmetric.   Assessment/Plan:  1.  Mild memory disturbance, mild  cognitive impairment  2.  Gait disturbance  3.  Migraine  headache  4.  Fibromyalgia  The patient appears to be quite stable with her cognitive level of functioning.  She will continue low-dose Aricept and Namenda.  There are some features about the gait examinations that suggest some functional overlay.  When asked to put her feet together for Romberg she started staggering before she actually puts her feet together.  There appears to be some giveaway type weakness with both legs with hip flexion.  The patient will continue her current medications, she will follow-up in 6 months.  Jill Alexanders MD 11/25/2017 10:37 AM  [facility] [address] [place], [place] [address]  Phone [phone] Fax [phone]

## 2017-11-25 NOTE — Telephone Encounter (Signed)
Received a fax from [facility] regarding a prior authorization approval for LEFLUNOMIDE through 10/25/2018  Rerence number: [id number] Phone [phone]  Will send document to scan center.  Marquell Saenz, [place], CPhT 8:53 AM

## 2017-11-26 DIAGNOSIS — R350 Frequency of micturition: Secondary | ICD-10-CM | POA: Diagnosis not present

## 2017-11-26 DIAGNOSIS — N3941 Urge incontinence: Secondary | ICD-10-CM | POA: Diagnosis not present

## 2017-11-30 ENCOUNTER — Ambulatory Visit: Admit: 2017-11-30 | Discharge: 2017-11-30 | Payer: MEDICARE | Primary: Internal Medicine

## 2017-11-30 DIAGNOSIS — E119 Type 2 diabetes mellitus without complications: Secondary | ICD-10-CM

## 2017-11-30 NOTE — Progress Notes (Signed)
Pharmacy Note - Diabetes   11/30/17       Patient name: Laura Roman (67 y.o., female)  Date of birth: Jan 13, 1951    Referred by: Albin Felling, MD for diabetes education / management   Past Medical History:   Diagnosis Date   ??? Allergic rhinitis    ??? Anxiety state, unspecified    ??? Arthritis     Dr. Synetta Shadow, Dr. Marisa Hua   ??? 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 Compression fx, thoracic spine ([place]) 2007    negative DEXA Dr. Marisa Hua   ??? Dyslipidemia    ??? Dyspepsia    ??? Frozen shoulder     right Dr. Wynonia Hazard MRI   ??? Left thyroid nodule 04/2016    1cm nodule; apparently noted on CT 2008 and unchanged   ??? Multiple lung nodules     no change 10/06, 03/07, 03/08   ??? Overweight (BMI 25.0-29.9)     IF 7/18 start weight 168 lbs   ??? Painless hematuria 04/2016    Dr Jimmye Norman, neg eval   ??? Palpitations 2008    neg thallium 2008, nl holter 2008, echo nl lv/ef 65%/tr mr/dd/nl pasp   ??? Prediabetes    ??? Syncope     neurocardiogenic by tilt 1994   ??? Venous insufficiency    ??? Vitamin D deficiency      Allergies:   Allergies   Allergen Reactions   ??? Fish Oil Nausea Only   ??? Relafen [Nabumetone] Nausea Only     Subjective / Objective      Medications:   Current medications for diabetes include:      Key Antihyperglycemic Medications             metFORMIN (GLUCOPHAGE) 500 mg tablet Take 1 Tab by mouth two (2) times daily (with meals).        Patient reports adherence with her medications. Hasn't started taking metformin yet (wanted to wait until this appointment)     Blood Glucose Findings:   She doesn't check her blood glucose readings currently.      Her last A1c value(s) noted to be:      Lab Results   Component Value Date/Time    Hemoglobin A1c 6.1 (H) 11/09/2017 08:46 AM    Hemoglobin A1c 6.1 (H) 05/04/2017 09:42 AM    Hemoglobin A1c 6.2 (H) 11/03/2016 10:09 AM     Dietary Considerations:    A typical day???s food intake is as follows:   - breakfast: depends, usually not hungry (half thin bagel, cereal with yogurt, english muffin). Sundays: eggs, meat, toast with husband    - lunch: half PB sandwich, lunch meat, fruit with peanut butter, rare chips, goes out occasionally      - dinner: always eats, hubsand works and eats after he gets home, meat, vegetables, salad, cabbage, sweet potatoes, chuck roast    - beverages: water, coffee (little creamer), unsweet tea, wine 1-2 glasses few times a week     - snacks: rare     Assessment / Plan        1.  Diabetes:  Goal A1C: < 7%. Patient's most recent A1c is at their current goal. During the visit today reviewed with patient: self-monitoring blood glucose fasting/post-prandial goals, importance of blood glucose control in avoidance of diabetic complications or further progression if  already present, signs/symptoms/management of hypoglycemia, impact of exercise on glucose control, and diet and health maintenance items explained below. Advised patient to start taking metformin at 1 tablet daily with food for a week before increasing to one tablet twice daily. All questions answered at this time and advised patient I would be available for follow up if needed in future.        2. Diet/lifestyle:  Reviewed with patient utilization of the plate method as a way to encourage healthy eating. Reviewed carbohydrate and non-carbohydrate rich foods, carbohydrate content of various foods, appropriate serving sizes for carbohydrates (15 grams = 1 carb serving), reading the nutrition label focusing on total carbohydrates while being mindful of serving size, recommended serving sizes for carbohydrates for meals and snacks (45-60g with meals and less than or equal to 15g for snacks ), and discussion regarding fruits as a carbohydrate.  Patient education materials were provided and reviewed with the patient for their future reference and use.        Patient verbalized understanding of the information presented and all of the patient???s questions were answered.  AVS was handed to the patient and information reviewed.      Patient advised to call the office with any additional questions or concerns.    Notification of recommendations will be sent to Albin Felling, MD for review.    Future Appointments   Date Time Provider [place]   01/04/2018 11:00 AM [facility] M[facility] TOMO RM HBV[facility] HBV   02/15/2018  9:45 AM [facility] NURSE VISIT [facility] SCHED   02/22/2018  9:20 AM Dumaran, Micheal Likens, MD [facility]NA SCHED     Thank you for the consult,  Renford Dills, PharmD, BCPS, BCACP, BC-ADM

## 2017-12-02 ENCOUNTER — Ambulatory Visit: Payer: Medicare Other

## 2017-12-03 ENCOUNTER — Ambulatory Visit (INDEPENDENT_AMBULATORY_CARE_PROVIDER_SITE_OTHER): Payer: Medicare Other | Admitting: Family Medicine

## 2017-12-03 ENCOUNTER — Telehealth: Payer: Self-pay

## 2017-12-03 VITALS — BP 139/72 | HR 78 | Wt 174.0 lb

## 2017-12-03 DIAGNOSIS — E538 Deficiency of other specified B group vitamins: Secondary | ICD-10-CM

## 2017-12-03 MED ORDER — IBUPROFEN 800 MG PO TABS: 800.0000 mg | ORAL_TABLET | Freq: Every day | ORAL | 3 refills | Status: DC

## 2017-12-03 MED ORDER — CYANOCOBALAMIN 1000 MCG/ML IJ SOLN
1000.0000 ug | Freq: Once | INTRAMUSCULAR | Status: AC
  Administered 2017-12-03: 1000 ug via INTRAMUSCULAR

## 2017-12-03 NOTE — Telephone Encounter (Signed)
Mia [name] states she has chronic bilateral knee pain. Her left knee has moderate arthritis. She had a total knee replacement on the right. She has an intolerance to almost all pain medications. She is using Voltaren gel and ibuprofen with little pain relief. She is wanting something to take at night so she is able to sleep. She is going to have a left knee replacement this year.   Allergies - Percocet [Oxycodone-acetaminophen] Nausea And Vomiting Medium Intolerance 10/12/2011 Past Updates...  Celebrex [Celecoxib] Other (See Comments) Low Intolerance 06/11/2014 Past Updates...  Other reaction(s): Other flushed    Codeine Nausea And Vomiting Low Intolerance 06/11/2008 Past Updates...  Darvocet [Propoxyphene N-acetaminophen] Nausea And Vomiting Low Intolerance 10/12/2011 Past Updates...  Erythromycin Nausea And Vomiting Low Intolerance 06/11/2014 Past Updates...  Nausea and vomiting    Hydrocodone Nausea And Vomiting Low Intolerance 10/12/2011 Past Updates...  Lyrica [Pregabalin] Swelling Low Intolerance 06/11/2014 Past Updates...  Legs swelling    Macrodantin [Nitrofurantoin] Nausea And Vomiting Low Intolerance 06/11/2008 Past Updates...  Toradol [Ketorolac Tromethamine] Nausea And Vomiting Low Intolerance 06/11/2014 Past Updates...  Per patient, only PO form causes nausea and vomiting. Can take injection without issue    Tramadol Nausea And Vomiting Low Intolerance 02/05/2014 Past Updates...  Verapamil Nausea And Vomiting Low

## 2017-12-03 NOTE — Progress Notes (Signed)
Agree with documentation as above.   Javante Nilsson, MD  

## 2017-12-03 NOTE — Progress Notes (Signed)
   Subjective:    Patient ID: Mia [name], female    DOB: 1951-06-02, 67 y.o.   MRN: [id number]  HPI  Mia [name] is here for a vitamin B 12 injection. Denies muscle cramps, weakness or irregular heart rate.   Review of Systems     Objective:   Physical Exam        Assessment & Plan:  Patient tolerated injection well without complications. Patient advised to schedule next injection 30 days from today.

## 2017-12-03 NOTE — Telephone Encounter (Signed)
Any thoughts T? I don't usually see her for her orth issues.

## 2017-12-03 NOTE — Telephone Encounter (Signed)
Ibuprofen 800 mg to use at bedtime.

## 2017-12-06 ENCOUNTER — Telehealth: Payer: Self-pay | Admitting: Rheumatology

## 2017-12-06 MED ORDER — LEFLUNOMIDE 20 MG PO TABS: 20.0000 mg | ORAL_TABLET | Freq: Every day | ORAL | 0 refills | Status: DC

## 2017-12-06 NOTE — Telephone Encounter (Signed)
Patient called requesting a 30 day supply of Leflunomide be sent to [facility] [place] so that she can use GoodRx and save $300.  Patient states the 90 day supply that was sent to [facility] was over $450 dollars.  The pharmacy [phone]

## 2017-12-06 NOTE — Telephone Encounter (Signed)
Left update on Pt's VM. Callback provided for any questions.

## 2017-12-06 NOTE — Telephone Encounter (Signed)
[facility] [place]. Patient is aware.

## 2017-12-08 ENCOUNTER — Other Ambulatory Visit: Payer: Self-pay | Admitting: *Deleted

## 2017-12-08 ENCOUNTER — Encounter: Payer: Self-pay | Admitting: Cardiovascular Disease

## 2017-12-08 ENCOUNTER — Ambulatory Visit (INDEPENDENT_AMBULATORY_CARE_PROVIDER_SITE_OTHER): Payer: Medicare Other | Admitting: Cardiovascular Disease

## 2017-12-08 ENCOUNTER — Other Ambulatory Visit: Payer: Self-pay

## 2017-12-08 VITALS — BP 138/64 | HR 111 | Ht 66.0 in | Wt 175.0 lb

## 2017-12-08 DIAGNOSIS — Z79899 Other long term (current) drug therapy: Secondary | ICD-10-CM

## 2017-12-08 DIAGNOSIS — I471 Supraventricular tachycardia: Secondary | ICD-10-CM

## 2017-12-08 DIAGNOSIS — R000 Tachycardia, unspecified: Secondary | ICD-10-CM | POA: Diagnosis not present

## 2017-12-08 LAB — COMPLETE METABOLIC PANEL WITH GFR
AG Ratio: 1.6 (calc) (ref 1.0–2.5)
ALT: 26 U/L (ref 6–29)
AST: 27 U/L (ref 10–35)
Albumin: 4.3 g/dL (ref 3.6–5.1)
Alkaline phosphatase (APISO): 89 U/L (ref 33–130)
BUN: 9 mg/dL (ref 7–25)
CO2: 28 mmol/L (ref 20–32)
Calcium: 10 mg/dL (ref 8.6–10.4)
Chloride: 99 mmol/L (ref 98–110)
Creat: 0.79 mg/dL (ref 0.50–0.99)
GFR, Est African American: 90 mL/min/{1.73_m2} (ref >=60)
GFR, Est Non African American: 78 mL/min/{1.73_m2} (ref >=60)
Globulin: 2.7 g/dL (calc) (ref 1.9–3.7)
Glucose, Bld: 115 mg/dL — ABNORMAL HIGH (ref 65–99)
Potassium: 4.2 mmol/L (ref 3.5–5.3)
Sodium: 136 mmol/L (ref 135–146)
Total Bilirubin: 0.5 mg/dL (ref 0.2–1.2)
Total Protein: 7 g/dL (ref 6.1–8.1)

## 2017-12-08 LAB — CBC WITH DIFFERENTIAL/PLATELET
Basophils Absolute: 81 cells/uL (ref 0–200)
Basophils Relative: 1.5 %
Eosinophils Absolute: 108 cells/uL (ref 15–500)
Eosinophils Relative: 2 %
HCT: 39.2 % (ref 35.0–45.0)
Hemoglobin: 13.5 g/dL (ref 11.7–15.5)
Lymphs Abs: 1123 cells/uL (ref 850–3900)
MCH: 30.9 pg (ref 27.0–33.0)
MCHC: 34.4 g/dL (ref 32.0–36.0)
MCV: 89.7 fL (ref 80.0–100.0)
MPV: 10.5 fL (ref 7.5–12.5)
Monocytes Relative: 12.7 %
Neutro Abs: 3402 cells/uL (ref 1500–7800)
Neutrophils Relative %: 63 %
Platelets: 362 10*3/uL (ref 140–400)
RBC: 4.37 10*6/uL (ref 3.80–5.10)
RDW: 12.7 % (ref 11.0–15.0)
Total Lymphocyte: 20.8 %
WBC mixed population: 686 cells/uL (ref 200–950)
WBC: 5.4 10*3/uL (ref 3.8–10.8)

## 2017-12-08 MED ORDER — METOPROLOL TARTRATE 100 MG PO TABS: 150.0000 mg | ORAL_TABLET | Freq: Two times a day (BID) | ORAL | 3 refills | Status: DC

## 2017-12-08 NOTE — Progress Notes (Signed)
Cardiology Office Note   Date:  12/08/2017   ID:  Mia [name], DOB Jul 21, 1951, MRN [id number]  PCP:  Hali Marry, MD  Cardiologist:   Mertie Moores, MD   Chief Complaint  Patient presents with  . Follow-up    SVT , hyperlipidemia    1. SVT 2. Raynaud's Phenomenon 3. Diabetes Mellitus 4. Minimal CAD by cath 2006 Mia [name]) 5. Sleep apnea - CPAP is currently not working  02/11/05 Right heart catheterization:  RA pressure: 8 mmHg  RV pressure: 25/7 mmHg  PA pressure: 20/9 mmHg  PWCP: 9 mmHg  CO: 4.6 liters per minute  LEFT HEART CATHETERIZATION RESULTS:  1. Left main: No significant disease.  2. LAD: Mild luminal irregularities.  3. First and second diagonal: Moderate size, mild luminal irregularities.  4. Left circumflex: Nondominant, mild luminal irregularities.  5. RCA: Dominant with mild luminal irregularities.  6. LV: EF is 55 to 60%, no wall motion abnormalities. LV EDP was 9 mmHg.   December 28, 2014:  Mia [name] is a 67 y.o. female who presents for follow-up with an episode of chest pain. She was seen by our nurse practitioner, Ignacia Bayley.    Diltiazem was DC'd.   She was started on Metoprolol 50 bid.   Myoview study was normal. She has normal left ventricle systolic function. She is going to the [facility] 3 times a week.  Doing well.     She is feeling much better.    Sept. 1, 2016:  Has had C diff for the past several months .  Doing well from a cardiac standpoint .  Has been fatigued.  Doing well with the metoprolol  Still has shortness of breath .   Nov. 3, 2017:    Mia [name] is seen today with her husband, [name].   Last week she started having some heart facing .  HR was 187 (using the app on her phone )  Could feel it in her ears. Got a little dizzy.  Sat down and relaxed and the episode resolved.  Had another episode this past Sunday .  HR stayed elevated for 2 hours   Aug. 24, 2018: Mia [name] is seen today with husband [name].    Followed for HTN and hyperlipidemia  Has had some elevated diastolic BP readings . Eats some salty foods still - likes ham. Complains of leg pain / ache when she is standing .    Nov. 16, 2018  Mia [name] was having more SVT, we have increased the metoprolol  Up to metoprolol 100 mg BID. She is much better controlled at this point Wearing the event monitor   December 08, 2017:  Mia [name] is seen today for follow-up of her supraventricular tachycardia  We had increased her metoprolol to 100 mg BID  Has occasional palpitations  No CP ,  No severe dyspnea. Does has some DOE that is likely due to generalized deconditioning  Needs left knee replacement this summer. Needs carple tunnel surgery in both wrists.  Has episodes of diaphoresis   Past Medical History:  Diagnosis Date  . Anal fissure   . Anemia   . Arthritis   . Blood transfusion   . C. difficile colitis   . Chest pain    a. 01/2013 MV: EF 59%, no ischemia.  . Chronic Dyspnea    a. 01/2013 Echo: EF 60-65%, Gr 1 DD, PASP 68mmHg.  . Diabetes mellitus   . Fibromyalgia   . Gall stones   .  GERD (gastroesophageal reflux disease)    gastritis  . Hemorrhoids   . Hypertension   . Interstitial cystitis   . MCI (mild cognitive impairment) 11/25/2017  . Memory difficulty 12/14/2013  . Neuromuscular disorder ([place])    sclerosis  . Osteoporosis   . Peptic ulcer   . PONV (postoperative nausea and vomiting)   . Pseudogout   . Raynaud phenomenon   . Rectal bleeding   . Sleep apnea    a. on cpap.  Mia [name] SVT (supraventricular tachycardia) ([place])   . Syncope 09/10/2015  . Thyroid disease    hypothyroidism  . Tremor, essential 05/14/2016    Past Surgical History:  Procedure Laterality Date  . APPENDECTOMY    . BLADDER SURGERY     x2  . BLADDER SURGERY    . BREAST BIOPSY    . CARDIAC CATHETERIZATION    . CATARACT EXTRACTION Bilateral   . CHOLECYSTECTOMY    . DILATION AND CURETTAGE OF UTERUS    . ENTEROCELE REPAIR     x2  . EYE  SURGERY     retina  . hysterectomy - unknown type    . KNEE ARTHROPLASTY    . KNEE SURGERY     x6  . NECK SURGERY     fusion  . OOPHORECTOMY    . QUADRICEPS REPAIR Right   . RECTOCELE REPAIR     x2  . SHOULDER ARTHROSCOPY WITH SUBACROMIAL DECOMPRESSION, ROTATOR CUFF REPAIR AND BICEP TENDON REPAIR  10/06/2012   Procedure: SHOULDER ARTHROSCOPY WITH SUBACROMIAL DECOMPRESSION, ROTATOR CUFF REPAIR AND BICEP TENDON REPAIR;  Surgeon: Nita Sells, MD;  Location: [facility];  Service: Orthopedics;  Laterality: Right;  Arthroscopic  Repair  of  Subscapularis, Open Biceps Tenodesis  . SHOULDER SURGERY     bilateral- bones spur  . TONSILLECTOMY    . TOTAL KNEE ARTHROPLASTY    . TOTAL SHOULDER ARTHROPLASTY       Current Outpatient Medications  Medication Sig Dispense Refill  . AMBULATORY NON FORMULARY MEDICATION Medication Name: One touch ultra strips. Check fasting blood sugar in the morning and as needed.  Dx - [id number] Fax to [phone] 50 strip 11  . atorvastatin (LIPITOR) 40 MG tablet Take 1 tablet (40 mg total) by mouth daily. 90 tablet 3  . calcium carbonate (OS-CAL) 600 MG TABS Take 600 mg by mouth daily with breakfast.     . cetirizine (ZYRTEC) 10 MG tablet Take 10 mg by mouth as needed for allergies.    . Cyanocobalamin (VITAMIN B-12 IJ) Inject 1,000 mg as directed every 28 (twenty-eight) days.    Mia [name] denosumab (PROLIA) 60 MG/ML SOLN injection Inject 60 mg into the skin every 6 (six) months. Administer in upper arm, thigh, or abdomen    . diclofenac sodium (VOLTAREN) 1 % GEL Apply topically 4 (four) times daily.    Mia [name] donepezil (ARICEPT) 5 MG tablet Take 1 tablet (5 mg total) by mouth at bedtime. 90 tablet 3  . fesoterodine (TOVIAZ) 8 MG TB24 tablet Take 8 mg by mouth daily.    . furosemide (LASIX) 20 MG tablet Take 1 tablet (20 mg total) by mouth daily. 90 tablet 3  . gabapentin (NEURONTIN) 300 MG capsule TAKE 1 TO 2 CAPSULES BY MOUTH UP TO TWO TIMES DAILY AS  NEEDED 180 capsule 3  . ibuprofen (ADVIL,MOTRIN) 800 MG tablet Take 1 tablet (800 mg total) by mouth daily with supper. 30 tablet 3  . ipratropium (ATROVENT) 0.03 % nasal spray Place  2 sprays into both nostrils every 12 (twelve) hours as needed for rhinitis.    Mia [name] leflunomide (ARAVA) 20 MG tablet Take 1 tablet (20 mg total) by mouth daily. 30 tablet 0  . Melatonin 5 MG TABS Take 5 mg by mouth at bedtime as needed.    . memantine (NAMENDA) 10 MG tablet Take 1 tablet (10 mg total) by mouth 2 (two) times daily. 180 tablet 4  . metFORMIN (GLUCOPHAGE-XR) 500 MG 24 hr tablet TAKE 1 TABLET EVERY DAY WITH BREAKFAST 90 tablet 1  . metoprolol tartrate (LOPRESSOR) 100 MG tablet Take 1 tablet (100 mg total) 2 (two) times daily by mouth. 180 tablet 3  . NON FORMULARY Place into the nose at bedtime. cpap machine    . ONE TOUCH ULTRA TEST test strip USE TO TEST BLOOD SUGAR ONCE DAILY 100 each 0  . pantoprazole (PROTONIX) 40 MG tablet Take 40 mg by mouth 2 (two) times daily.    . primidone (MYSOLINE) 50 MG tablet TAKE 1 TABLET AT BEDTIME 90 tablet 1  . Probiotic Product (PROBIOTIC ADVANCED PO) Take 1 capsule by mouth daily.    . propranolol (INDERAL) 10 MG tablet Take 1 tablet (10 mg total) by mouth 4 (four) times daily as needed. 90 tablet 6  . SUMAtriptan (IMITREX) 50 MG tablet Take 1 tablet (50 mg total) by mouth every 2 (two) hours as needed. 9 tablet 6  . venlafaxine XR (EFFEXOR-XR) 75 MG 24 hr capsule TAKE 1 CAPSULE BY MOUTH  DAILY WITH BREAKFAST 90 capsule 3  . vitamin D, CHOLECALCIFEROL, 400 UNITS tablet Take 400 Units by mouth daily.      No current facility-administered medications for this visit.     Allergies:   Myrbetriq  [mirabegron]; Percocet [oxycodone-acetaminophen]; Celebrex [celecoxib]; Codeine; Darvocet [propoxyphene n-acetaminophen]; Erythromycin; Hydrocodone; Lyrica [pregabalin]; Macrodantin [nitrofurantoin]; Toradol [ketorolac tromethamine]; Tramadol; and Verapamil    Social History:   The patient  reports that  has never smoked. she has never used smokeless tobacco. She reports that she does not drink alcohol or use drugs.   Family History:  The patient's family history includes Asthma in her father, mother, sister, and sister; Breast cancer in her maternal grandmother; Colon polyps in her mother; Dementia in her sister; Diabetes in her mother; Heart attack in her father and mother; Heart disease in her father and mother; Hypertension in her sister; Lupus in her sister; Pancreatic cancer in her maternal grandfather; Rheum arthritis in her sister; Stroke in her mother and sister; Wilson's disease in her maternal grandmother.    ROS: Noted in current history, all other systems are negative.   Physical Exam: Blood pressure 138/64, pulse (!) 111, height 5\' 6"  (1.676 m), weight 175 lb (79.4 kg), SpO2 98 %.  GEN:  Well nourished, well developed in no acute distress HEENT: Normal NECK: No JVD; No carotid bruits LYMPHATICS: No lymphadenopathy CARDIAC: RR,  Tachycardic  RESPIRATORY:  Clear to auscultation without rales, wheezing or rhonchi  ABDOMEN: Soft, non-tender, non-distended MUSCULOSKELETAL:  No edema; No deformity  SKIN: Warm and dry NEUROLOGIC:  Alert and oriented x 3  EKG:        Recent Labs: 08/18/2017: TSH 1.10 09/06/2017: ALT 20; BUN 10; Creat 0.92; Hemoglobin 13.7; Platelets 352; Potassium 4.6; Sodium 138    Lipid Panel    Component Value Date/Time   CHOL 119 06/18/2017 0951   TRIG 161 (H) 06/18/2017 0951   HDL 40 06/18/2017 0951   CHOLHDL 3.0 06/18/2017 0951   CHOLHDL  3.2 08/28/2016 1558   VLDL 22 08/28/2016 1558   LDLCALC 47 06/18/2017 0951      Wt Readings from Last 3 Encounters:  12/08/17 175 lb (79.4 kg)  12/03/17 174 lb (78.9 kg)  11/25/17 173 lb (78.5 kg)      Other studies Reviewed: Additional studies/ records that were reviewed today include: . Review of the above records demonstrates:    ASSESSMENT AND PLAN:  1. SVT -    Still  has occasional palpis. She remains tachycardic on metoprolol 100 mg BID Will increase metoprolol to 150 mg BID   3. Diabetes Mellitus  4. Minimal CAD by cath 2006 Mia [name])  5. Sleep apnea -    6. Dyspnea on exertion:  I suspect this is deconditioning . Ive asked her to start a regular exercise program   Current medicines are reviewed at length with the patient today.  The patient does not have concerns regarding medicines.  The following changes have been made:  no change  Disposition:   FU with me in 6  months     Mertie Moores, MD  12/08/2017 9:43 AM    [address], [place], [place]  [address] Phone: [phone]; Fax: [phone]

## 2017-12-08 NOTE — Patient Instructions (Signed)
Medication Instructions:  Your physician has recommended you make the following change in your medication:  INCREASE Metoprolol (Lopressor) to 150 mg twice daily   Labwork: None Ordered   Testing/Procedures: None Ordered   Follow-Up: Your physician recommends that you schedule a follow-up appointment in: 3 months with Dr. Acie Fredrickson   If you need a refill on your cardiac medications before your next appointment, please call your pharmacy.   Thank you for choosing [facility]! Christen Bame, RN [phone]

## 2017-12-14 ENCOUNTER — Telehealth

## 2017-12-14 NOTE — Telephone Encounter (Signed)
PT received a letter stating that she Needs a written order for bilateral diagnostic mammogram with U/S- her appt is 03/12 at [facility]

## 2017-12-14 NOTE — Telephone Encounter (Signed)
ordered

## 2017-12-15 DIAGNOSIS — Z86010 Personal history of colonic polyps: Secondary | ICD-10-CM | POA: Diagnosis not present

## 2017-12-15 DIAGNOSIS — K219 Gastro-esophageal reflux disease without esophagitis: Secondary | ICD-10-CM | POA: Diagnosis not present

## 2017-12-15 DIAGNOSIS — K582 Mixed irritable bowel syndrome: Secondary | ICD-10-CM | POA: Diagnosis not present

## 2017-12-17 DIAGNOSIS — G5602 Carpal tunnel syndrome, left upper limb: Secondary | ICD-10-CM | POA: Diagnosis not present

## 2017-12-17 DIAGNOSIS — M06232 Rheumatoid bursitis, left wrist: Secondary | ICD-10-CM | POA: Diagnosis not present

## 2017-12-19 MED ORDER — CITALOPRAM 10 MG TAB
10 mg | ORAL_TABLET | ORAL | 3 refills | Status: DC
Start: 2017-12-19 — End: 2018-12-09

## 2017-12-20 ENCOUNTER — Telehealth

## 2017-12-20 MED ORDER — ACARBOSE 25 MG TAB
25 mg | ORAL_TABLET | Freq: Three times a day (TID) | ORAL | 6 refills | Status: DC
Start: 2017-12-20 — End: 2018-02-22

## 2017-12-20 NOTE — Telephone Encounter (Signed)
Pt calling, says metformin is not working for her. Causing diarrhea. Wants to know if there is another med she can try?

## 2017-12-20 NOTE — Telephone Encounter (Signed)
Dc metformin  Trial precose, take w meals

## 2017-12-21 NOTE — Telephone Encounter (Signed)
Chief Complaint   Patient presents with   ??? Other   ??? Request For New Medication     per Dr Ky Barban check your pharmacy for new medication Precose 25 mg take as prescribed, and discontinue taking Metformin medication     12-21-17 Patient reached and 2 identifiers were used: Full Name, and Date of Birth.  The patient is already aware of the new medication Precose at her [employer], she will go today to pick up the medication, and understands to take as prescribed, she will also stop taking the Metformin medication.

## 2017-12-23 NOTE — Progress Notes (Signed)
Office Visit Note  Patient: Mia [name]             Date of Birth: 1951/07/08           MRN: [id number]             PCP: Hali Marry, MD Referring: Hali Marry, * Visit Date: 01/06/2018 Occupation: @GUAROCC @    Subjective:  Hand pain   History of Present Illness: Mia [name] is a 67 y.o. female with history of seronegative rheumatoid arthritis, osteoarthritis, DDD, pseudogout, fibromyalgia, and osteoporosis.  Patient states that 3 weeks ago she had a carpal tunnel release by Dr. Grandville Silos.  She has developed a swollen region and a burning sensation.  She denies any fevers or chills.  She denies any drainage from the wound.  She states she continues to have some numbness in her left index finger.  Patient states she also has carpal tunnel the right wrist.  She is can have this operated on once her left has healed fully.  Patient denies any pseudogout flares recently.  She states that she was off of her [place] for about 2 weeks due to the surgery.  She restarted her [place] last week.  She has been having increased pain and swelling in her bilateral hands.  She is also been having increased joint stiffness.  She states her bilateral knees been causing discomfort.  She states her right knee replacement remains slightly swollen.  She states she needs to get her left knee replaced but she is not ready at this time.   Patient states she has been experiencing increased fibromyalgia fog.  She states she has been more forgetful and feels "out of sorts." she denies any changes in medications.  Patient states she continues to have chronic lower back pain.  She has previous had facet joint injections and epidural injections, which helped.  She is going to schedule another injection.  She continues to have neck stiffness following C5-C6 spinal fusion.  She reports trapezius muscle tension and muscle tenderness.       Activities of Daily Living:  Patient reports morning stiffness for 30  minutes.   Patient Reports nocturnal pain.  Difficulty dressing/grooming: Denies Difficulty climbing stairs: Reports Difficulty getting out of chair: Reports Difficulty using hands for taps, buttons, cutlery, and/or writing: Reports   Review of Systems  Constitutional: Positive for fatigue and weakness.  HENT: Negative for trouble swallowing, trouble swallowing and mouth dryness.   Eyes: Positive for dryness.  Respiratory: Positive for shortness of breath. Negative for cough.   Cardiovascular: Positive for swelling in legs/feet. Negative for chest pain, palpitations and hypertension.  Gastrointestinal: Positive for constipation, diarrhea, heartburn and nausea. Negative for blood in stool.  Endocrine: Positive for cold intolerance, heat intolerance, excessive thirst and increased urination.  Genitourinary: Negative for difficulty urinating.  Musculoskeletal: Positive for arthralgias, gait problem, joint pain, joint swelling, muscle weakness, morning stiffness and muscle tenderness. Negative for myalgias and myalgias.  Skin: Negative for rash, hair loss and sensitivity to sunlight.  Allergic/Immunologic: Negative for susceptible to infections.  Neurological: Positive for dizziness, light-headedness, numbness, headaches and memory loss.  Hematological: Negative for bruising/bleeding tendency and swollen glands.  Psychiatric/Behavioral: Negative for depressed mood and sleep disturbance. The patient is not nervous/anxious.     PMFS History:  Patient Active Problem List   Diagnosis Date Noted  . MCI (mild cognitive impairment) 11/25/2017  . Restrictive lung disease 07/30/2017  . Mixed hyperlipidemia 06/18/2017  . Osteoporosis  04/18/2017  . Trochanteric bursitis of both hips 02/24/2017  . Stress fracture of left tibia 11/05/2016  . Inflammatory arthritis 09/30/2016  . High risk medication use 09/30/2016  . History of Clostridium difficile colitis 09/30/2016  . Seronegative rheumatoid  arthritis 09/30/2016  . Pseudogout 09/30/2016  . DDD cervical spine status post fusion 09/30/2016  . DDD thoracic spine 09/30/2016  . H/O total knee replacement, right 09/30/2016  . Age-related osteoporosis without current pathological fracture 09/30/2016  . Tremor, essential 05/14/2016  . Right foot pain 04/14/2016  . B12 deficiency 09/10/2015  . Syncope 09/10/2015  . Cervical facet joint syndrome 09/10/2015  . Chronic fatigue 09/10/2015  . Aortic atherosclerosis ([place]) 04/01/2015  . Primary osteoarthritis of left knee 02/27/2015  . Shortness of breath   . DOE (dyspnea on exertion)   . Benign head tremor 06/11/2014  . Lumbar degenerative disc disease 06/11/2014  . IFG (impaired fasting glucose) 03/09/2014  . Osteopenia 03/09/2014  . Hemorrhoid 03/09/2014  . Retinal wrinkling, right eye 03/09/2014  . Fibromyalgia 03/09/2014  . GERD (gastroesophageal reflux disease) 03/09/2014  . History of arthroplasty of right knee 01/31/2014  . Memory difficulty 12/14/2013  . Hemorrhoids, external, thrombosed 06/22/2011  . Hypertension 03/11/2011  . SVT (supraventricular tachycardia) ([place])   . Raynaud phenomenon   . EDEMA 08/25/2010  . Nonspecific (abnormal) findings on radiological and other examination of body structure 08/25/2010  . COMPUTERIZED TOMOGRAPHY, CHEST, ABNORMAL 08/25/2010  . OSA (obstructive sleep apnea) 04/24/2009  . Allergic rhinitis 06/11/2008  . Dyspnea 06/11/2008  . 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 Chronic diastolic CHF (congestive heart failure) ([place]) 06/08/2008  . INTERSTITIAL CYSTITIS 06/08/2008    Past Medical History:  Diagnosis Date  . Anal fissure   . Anemia   . Arthritis   . Blood transfusion   . C. difficile colitis   . Chest pain    a. 01/2013 MV: EF 59%, no ischemia.  . Chronic Dyspnea    a. 01/2013 Echo: EF 60-65%, Gr 1 DD, PASP 1mmHg.  . Diabetes mellitus   . Fibromyalgia   . Gall stones   . GERD (gastroesophageal reflux  disease)    gastritis  . Hemorrhoids   . Hypertension   . Interstitial cystitis   . MCI (mild cognitive impairment) 11/25/2017  . Memory difficulty 12/14/2013  . Neuromuscular disorder ([place])    sclerosis  . Osteoporosis   . Peptic ulcer   . PONV (postoperative nausea and vomiting)   . Pseudogout   . Raynaud phenomenon   . Rectal bleeding   . Sleep apnea    a. on cpap.  Marland Kitchen SVT (supraventricular tachycardia) ([place])   . Syncope 09/10/2015  . Thyroid disease    hypothyroidism  . Tremor, essential 05/14/2016    Family History  Problem Relation Age of Onset  . Heart attack Mother   . Stroke Mother   . Diabetes Mother   . Heart disease Mother   . Colon polyps Mother   . Asthma Mother   . Breast cancer Maternal Grandmother   . Wilson's disease Maternal Grandmother   . Pancreatic cancer Maternal Grandfather   . Heart disease Father   . Heart attack Father   . Asthma Father   . Stroke Sister   . Hypertension Sister   . Rheum arthritis Sister   . Dementia Sister   . Asthma Sister   . Lupus Sister   . Asthma Sister    Past Surgical History:  Procedure Laterality Date  .  APPENDECTOMY    . BLADDER SURGERY     x2  . BLADDER SURGERY    . BREAST BIOPSY    . CARDIAC CATHETERIZATION    . CARPAL TUNNEL RELEASE    . CATARACT EXTRACTION Bilateral   . CHOLECYSTECTOMY    . DILATION AND CURETTAGE OF UTERUS    . ENTEROCELE REPAIR     x2  . EYE SURGERY     retina  . hysterectomy - unknown type    . KNEE ARTHROPLASTY    . KNEE SURGERY     x6  . NECK SURGERY     fusion  . OOPHORECTOMY    . QUADRICEPS REPAIR Right   . RECTOCELE REPAIR     x2  . SHOULDER ARTHROSCOPY WITH SUBACROMIAL DECOMPRESSION, ROTATOR CUFF REPAIR AND BICEP TENDON REPAIR  10/06/2012   Procedure: SHOULDER ARTHROSCOPY WITH SUBACROMIAL DECOMPRESSION, ROTATOR CUFF REPAIR AND BICEP TENDON REPAIR;  Surgeon: Nita Sells, MD;  Location: [facility];  Service: Orthopedics;  Laterality: Right;   Arthroscopic  Repair  of  Subscapularis, Open Biceps Tenodesis  . SHOULDER SURGERY     bilateral- bones spur  . TONSILLECTOMY    . TOTAL KNEE ARTHROPLASTY    . TOTAL SHOULDER ARTHROPLASTY     Social History   Social History Narrative   Patient drinks about 3-4 cups of caffeine daily.   Patient is right handed.     Objective: Vital Signs: BP 120/63 (BP Location: Left Arm, Patient Position: Sitting, Cuff Size: Normal)   Pulse 74   Resp 16   Ht 5\' 6"  (1.676 m)   Wt 178 lb (80.7 kg)   BMI 28.73 kg/m    Physical Exam  Constitutional: She is oriented to person, place, and time. She appears well-developed and well-nourished.  HENT:  Head: Normocephalic and atraumatic.  Eyes: Conjunctivae and EOM are normal.  Neck: Normal range of motion.  Cardiovascular: Normal rate, regular rhythm, normal heart sounds and intact distal pulses.  Pulmonary/Chest: Effort normal and breath sounds normal.  Abdominal: Soft. Bowel sounds are normal.  Lymphadenopathy:    She has no cervical adenopathy.  Neurological: She is alert and oriented to person, place, and time.  Skin: Skin is warm and dry. Capillary refill takes less than 2 seconds.  Psychiatric: She has a normal mood and affect. Her behavior is normal.  Nursing note and vitals reviewed.    Musculoskeletal Exam: C-spine slightly limited ROM.  Thoracic and lumbar spine good range of motion.  No midline spinal tenderness.  She has mild SI joint tenderness.  Right shoulder slightly limited range of motion with internal rotation and abduction.  Left shoulder good ROM.  Elbow joints good ROM.  Left wrist slightly limited ROM.  Wound healing well from carpal tunnel release.  Left wrist flexor tenosynovitis. MCP tenderness but no synovitis.  Hip joints, knee joints, ankle joints, MTPs, PIPs, and DIPs good ROM with no synovitis.  She has warmth of right knee.  No effusion.  She has limited extension of bilateral knees. She has tenderness of bilateral  trochanteric bursa.     CDAI Exam: No CDAI exam completed.    Investigation: No additional findings. CBC Latest Ref Rng & Units 12/08/2017 09/06/2017 08/18/2017  WBC 3.8 - 10.8 Thousand/uL 5.4 5.1 6.7  Hemoglobin 11.7 - 15.5 g/dL 13.5 13.7 14.3  Hematocrit 35.0 - 45.0 % 39.2 40.9 43.3  Platelets 140 - 400 Thousand/uL 362 352 385   CMP Latest Ref Rng &  Units 12/08/2017 09/06/2017 08/23/2017  Glucose 65 - 99 mg/dL 115(H) 124(H) 87  BUN 7 - 25 mg/dL 9 10 9   Creatinine 0.50 - 0.99 mg/dL 0.79 0.92 0.92  Sodium 135 - 146 mmol/L 136 138 136  Potassium 3.5 - 5.3 mmol/L 4.2 4.6 4.0  Chloride 98 - 110 mmol/L 99 100 97(L)  CO2 20 - 32 mmol/L 28 31 28   Calcium 8.6 - 10.4 mg/dL 10.0 10.1 9.5  Total Protein 6.1 - 8.1 g/dL 7.0 7.0 6.7  Total Bilirubin 0.2 - 1.2 mg/dL 0.5 0.4 0.4  Alkaline Phos 33 - 130 U/L - - -  AST 10 - 35 U/L 27 21 26   ALT 6 - 29 U/L 26 20 20     Imaging: No results found.  Speciality Comments: No specialty comments available.    Procedures:  No procedures performed Allergies: Myrbetriq  [mirabegron]; Percocet [oxycodone-acetaminophen]; Celebrex [celecoxib]; Codeine; Darvocet [propoxyphene n-acetaminophen]; Erythromycin; Hydrocodone; Lyrica [pregabalin]; Macrodantin [nitrofurantoin]; Toradol [ketorolac tromethamine]; Tramadol; and Verapamil   Assessment / Plan:     Visit Diagnoses: Seronegative rheumatoid arthritis: She has left wrist flexor tenosynovitis.  She had a left carpal tunnel release with Dr. Grandville Silos 3 weeks ago.  She was off of Arava 20 mg for 2 weeks.  She restarted last week.  She has tenderness of MCPs but no synovitis noted.  She will continue on Arava 20 mg daily.  She is following up with Dr. Grandville Silos in a few weeks.  The wound is healing well.  She denies any fevers, chills, or wound drainage. She will return for labs in May and every 3 months.  She did not need any refills today.  She requested a sample of Arava due to the cost, but we do not have  samples in the office.   High risk medication use - Arava 20 mg by mouth daily.  CBC and CMP will be drawn in May and every 3 months to monitor for drug toxicity.    Primary osteoarthritis of left knee: She has limited extension.  No warmth or effusion on exam.  She needs a left knee replacement but she is not ready to schedule it.   H/O total knee replacement, right: Slight warmth.  No effusion noted.  She experiences occasional discomfort.   Pseudogout: No recent flares.   DDD (degenerative disc disease), cervical - status post fusion of C5-C6.  She has good ROM on exam with no discomfort.  She has trapezius muscle tension and tenderness.    DDD (degenerative disc disease), thoracic: Chronic pain.  No midline spinal tenderness.   DDD (degenerative disc disease), lumbar: Chronic pain.  No midline spinal tenderness.    Fibromyalgia: She has been having increased fibromyalgia fog.  She has muscle tension and muscle tenderness in trapezius region.  She is on Gabapentin.  She was encouraged to exercise on a regular basis.    S/p carpal tunnel release, left wrist: Performed by Dr. Grandville Silos.  She has flexor tenosynovitis.  She restarted her [place] last week.  She is following up with Dr. Grandville Silos in a few weeks.  She has not had any fevers, chills, or wound drainage.   Other osteoporosis without current pathological fracture - she is on Prolia.  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  No orders of the defined types were placed in this encounter.   Face-to-face time spent with patient was 40 minutes. >50% of time was spent in counseling and  coordination of care.  Follow-Up Instructions: Return in about 5 months (around 06/08/2018) for Rheumatoid arthritis, Osteoarthritis, Fibromyalgia, Osteoporosis.   [name], PA-C   I examined and evaluated the patient with Hazel Sams PA. The plan of care was discussed as noted above.  Bo Merino, MD  Note - This record has been created using Editor, commissioning.  Chart creation errors have been sought, but may not always  have been located. Such creation errors do not reflect on  the standard of medical care.

## 2017-12-24 DIAGNOSIS — R350 Frequency of micturition: Secondary | ICD-10-CM | POA: Diagnosis not present

## 2017-12-24 DIAGNOSIS — N3941 Urge incontinence: Secondary | ICD-10-CM | POA: Diagnosis not present

## 2017-12-29 DIAGNOSIS — G5602 Carpal tunnel syndrome, left upper limb: Secondary | ICD-10-CM | POA: Diagnosis not present

## 2017-12-31 ENCOUNTER — Ambulatory Visit (INDEPENDENT_AMBULATORY_CARE_PROVIDER_SITE_OTHER): Payer: Medicare Other | Admitting: Family Medicine

## 2017-12-31 VITALS — BP 140/66 | HR 73 | Wt 178.0 lb

## 2017-12-31 DIAGNOSIS — E538 Deficiency of other specified B group vitamins: Secondary | ICD-10-CM

## 2017-12-31 MED ORDER — CYANOCOBALAMIN 1000 MCG/ML IJ SOLN
1000.0000 ug | Freq: Once | INTRAMUSCULAR | Status: AC
  Administered 2017-12-31: 1000 ug via INTRAMUSCULAR

## 2017-12-31 NOTE — Progress Notes (Signed)
   Subjective:    Patient ID: Mia [name], female    DOB: May 11, 1951, 67 y.o.   MRN: [id number]  HPI  Rainey Rodger Eubanks is here for a vitamin B 12 injection. Denies muscle cramps, weakness or irregular heart rate.   Review of Systems     Objective:   Physical Exam        Assessment & Plan:  Patient tolerated injection well without complications. Patient advised to schedule next injection 30 days from today.

## 2017-12-31 NOTE — Progress Notes (Signed)
Agree with documentation as above.   Catherine Metheney, MD  

## 2018-01-04 ENCOUNTER — Telehealth

## 2018-01-04 ENCOUNTER — Encounter

## 2018-01-04 ENCOUNTER — Inpatient Hospital Stay: Admit: 2018-01-04 | Payer: MEDICARE | Attending: Internal Medicine | Primary: Internal Medicine

## 2018-01-04 DIAGNOSIS — N6320 Unspecified lump in the left breast, unspecified quadrant: Secondary | ICD-10-CM

## 2018-01-04 DIAGNOSIS — R928 Other abnormal and inconclusive findings on diagnostic imaging of breast: Secondary | ICD-10-CM

## 2018-01-04 NOTE — Telephone Encounter (Signed)
Need diagnostic mammo per [facility]

## 2018-01-05 NOTE — Telephone Encounter (Signed)
Patient stating she is getting a call from central scheduling to schedule some kind of test and she is unsure why. She doesn't know what test it is. She didn't want to give them any of her information when they called because she didn't know anything about it. She had a mammogram and ultrasound done yesterday. She said there was some confusion when she was there yesterday and they didn't have an order for the ultrasound. They made some calls and she was able to get it done. Please advise if there is need for additional testing.

## 2018-01-06 ENCOUNTER — Ambulatory Visit (INDEPENDENT_AMBULATORY_CARE_PROVIDER_SITE_OTHER): Payer: Medicare Other | Admitting: Rheumatology

## 2018-01-06 ENCOUNTER — Encounter: Payer: Self-pay | Admitting: Rheumatology

## 2018-01-06 VITALS — BP 120/63 | HR 74 | Resp 16 | Ht 66.0 in | Wt 178.0 lb

## 2018-01-06 DIAGNOSIS — Z8679 Personal history of other diseases of the circulatory system: Secondary | ICD-10-CM

## 2018-01-06 DIAGNOSIS — Z8619 Personal history of other infectious and parasitic diseases: Secondary | ICD-10-CM | POA: Diagnosis not present

## 2018-01-06 DIAGNOSIS — M5134 Other intervertebral disc degeneration, thoracic region: Secondary | ICD-10-CM

## 2018-01-06 DIAGNOSIS — M797 Fibromyalgia: Secondary | ICD-10-CM

## 2018-01-06 DIAGNOSIS — M818 Other osteoporosis without current pathological fracture: Secondary | ICD-10-CM | POA: Diagnosis not present

## 2018-01-06 DIAGNOSIS — Z87898 Personal history of other specified conditions: Secondary | ICD-10-CM

## 2018-01-06 DIAGNOSIS — G1223 Primary lateral sclerosis: Secondary | ICD-10-CM

## 2018-01-06 DIAGNOSIS — M0609 Rheumatoid arthritis without rheumatoid factor, multiple sites: Secondary | ICD-10-CM

## 2018-01-06 DIAGNOSIS — Z96651 Presence of right artificial knee joint: Secondary | ICD-10-CM

## 2018-01-06 DIAGNOSIS — M1120 Other chondrocalcinosis, unspecified site: Secondary | ICD-10-CM

## 2018-01-06 DIAGNOSIS — M1712 Unilateral primary osteoarthritis, left knee: Secondary | ICD-10-CM

## 2018-01-06 DIAGNOSIS — M5030 Other cervical disc degeneration, unspecified cervical region: Secondary | ICD-10-CM

## 2018-01-06 DIAGNOSIS — M5136 Other intervertebral disc degeneration, lumbar region: Secondary | ICD-10-CM | POA: Diagnosis not present

## 2018-01-06 DIAGNOSIS — Z8709 Personal history of other diseases of the respiratory system: Secondary | ICD-10-CM

## 2018-01-06 DIAGNOSIS — Z8719 Personal history of other diseases of the digestive system: Secondary | ICD-10-CM

## 2018-01-06 DIAGNOSIS — N3010 Interstitial cystitis (chronic) without hematuria: Secondary | ICD-10-CM | POA: Diagnosis not present

## 2018-01-06 DIAGNOSIS — Z79899 Other long term (current) drug therapy: Secondary | ICD-10-CM

## 2018-01-06 DIAGNOSIS — R5382 Chronic fatigue, unspecified: Secondary | ICD-10-CM

## 2018-01-06 DIAGNOSIS — Z9889 Other specified postprocedural states: Secondary | ICD-10-CM

## 2018-01-06 NOTE — Patient Instructions (Signed)
Standing Labs We placed an order today for your standing lab work.    Please come back and get your standing labs in May and every 3 months  We have open lab Monday through Friday from 8:30-11:30 AM and 1:30-4 PM at the office of Dr. Shaili Deveshwar.   The office is located at [address], [address], [place], [place] [address] No appointment is necessary.   Labs are drawn by [facility].  You may receive a bill from [facility] for your lab work. If you have any questions regarding directions or hours of operation,  please call [phone].    

## 2018-01-06 NOTE — Telephone Encounter (Signed)
LMTCB      Please tell patient to find out from central scheduling what they are trying to schedule her for. All I see is ultrasound and mammogram.

## 2018-01-06 NOTE — Telephone Encounter (Signed)
Pt returning call to [facility]. Says she has already spoken to [employer]. Says she had the ultra sound when she had the mammo. Says we put in another order the day after she had the test. Says it is a duplicate and needs to be cancelled since it is generating the phone calls she is getting to schedule appt.

## 2018-01-07 DIAGNOSIS — R112 Nausea with vomiting, unspecified: Secondary | ICD-10-CM | POA: Diagnosis not present

## 2018-01-07 DIAGNOSIS — R42 Dizziness and giddiness: Secondary | ICD-10-CM | POA: Diagnosis not present

## 2018-01-10 ENCOUNTER — Ambulatory Visit (INDEPENDENT_AMBULATORY_CARE_PROVIDER_SITE_OTHER): Payer: Medicare Other | Admitting: Family Medicine

## 2018-01-10 ENCOUNTER — Encounter: Payer: Self-pay | Admitting: Family Medicine

## 2018-01-10 VITALS — BP 121/47 | HR 65 | Temp 97.6°F | Ht 66.0 in | Wt 172.0 lb

## 2018-01-10 DIAGNOSIS — H8111 Benign paroxysmal vertigo, right ear: Secondary | ICD-10-CM

## 2018-01-10 DIAGNOSIS — R229 Localized swelling, mass and lump, unspecified: Secondary | ICD-10-CM | POA: Diagnosis not present

## 2018-01-10 NOTE — Progress Notes (Addendum)
Subjective:    Patient ID: Mia [name], female    DOB: 15-Oct-1951, 67 y.o.   MRN: [id number]  HPI 67 year old female with a history of hypertension, and impaired fasting glucose, and fibromyalgia comes in today complaining of nausea.  She reports that about 2 weeks ago she went to a luncheon meeting on a Saturday.  By the end of the meeting she started feeling very nauseated.  She says it really almost felt like motion sickness but yet she really was not dizzy with it.  It lasted several days, approximately 5 days.  She says she just rested and laid around mostly.  She actually started to feel better and then Thursday evening again after another meeting started to feel really bad again.  [facility]  [facility] diagnosed her with vertigo.  She was given Zofran and also took some over-the-counter Alka-Seltzer.  She does feel a little bit better today.  She did vomit once from the nausea which was on Thursday.  No abdominal pain. No change in stool.    She also has a lump on the anterior side of her left wrist just at the proximal end of her incision from carpal tunnel surgery that she just had done a few weeks ago.  She says it is not very painful but noticed that it was swollen.  No erythema.  No fevers or chills.  Review of Systems   BP (!) 121/47   Pulse 65   Temp 97.6 F (36.4 C)   Ht 5\' 6"  (1.676 m)   Wt 172 lb (78 kg)   SpO2 96%   BMI 27.76 kg/m     Allergies  Allergen Reactions  . Myrbetriq  [Mirabegron] Other (See Comments)    SEVERE HEADACHE  . Percocet [Oxycodone-Acetaminophen] Nausea And Vomiting  . Celebrex [Celecoxib] Other (See Comments)    Other reaction(s): Other flushed  . Codeine Nausea And Vomiting  . Darvocet [Propoxyphene N-Acetaminophen] Nausea And Vomiting  . Erythromycin Nausea And Vomiting    Nausea and vomiting   . Hydrocodone Nausea And Vomiting  . Lyrica [Pregabalin] Swelling    Legs swelling   . Macrodantin [Nitrofurantoin]  Nausea And Vomiting  . Toradol [Ketorolac Tromethamine] Nausea And Vomiting    Per patient, only PO form causes nausea and vomiting. Can take injection without issue  . Tramadol Nausea And Vomiting  . Verapamil Nausea And Vomiting    REACTION: intolerance    Past Medical History:  Diagnosis Date  . Anal fissure   . Anemia   . Arthritis   . Blood transfusion   . C. difficile colitis   . Chest pain    a. 01/2013 MV: EF 59%, no ischemia.  . Chronic Dyspnea    a. 01/2013 Echo: EF 60-65%, Gr 1 DD, PASP 72mmHg.  . Diabetes mellitus   . Fibromyalgia   . Gall stones   . GERD (gastroesophageal reflux disease)    gastritis  . Hemorrhoids   . Hypertension   . Interstitial cystitis   . MCI (mild cognitive impairment) 11/25/2017  . Memory difficulty 12/14/2013  . Neuromuscular disorder ([place])    sclerosis  . Osteoporosis   . Peptic ulcer   . PONV (postoperative nausea and vomiting)   . Pseudogout   . Raynaud phenomenon   . Rectal bleeding   . Sleep apnea    a. on cpap.  Marland Kitchen SVT (supraventricular tachycardia) ([place])   . Syncope 09/10/2015  . Thyroid disease  hypothyroidism  . Tremor, essential 05/14/2016    Past Surgical History:  Procedure Laterality Date  . APPENDECTOMY    . BLADDER SURGERY     x2  . BLADDER SURGERY    . BREAST BIOPSY    . CARDIAC CATHETERIZATION    . CARPAL TUNNEL RELEASE    . CATARACT EXTRACTION Bilateral   . CHOLECYSTECTOMY    . DILATION AND CURETTAGE OF UTERUS    . ENTEROCELE REPAIR     x2  . EYE SURGERY     retina  . hysterectomy - unknown type    . KNEE ARTHROPLASTY    . KNEE SURGERY     x6  . NECK SURGERY     fusion  . OOPHORECTOMY    . QUADRICEPS REPAIR Right   . RECTOCELE REPAIR     x2  . SHOULDER ARTHROSCOPY WITH SUBACROMIAL DECOMPRESSION, ROTATOR CUFF REPAIR AND BICEP TENDON REPAIR  10/06/2012   Procedure: SHOULDER ARTHROSCOPY WITH SUBACROMIAL DECOMPRESSION, ROTATOR CUFF REPAIR AND BICEP TENDON REPAIR;  Surgeon: Nita Sells, MD;  Location: [facility];  Service: Orthopedics;  Laterality: Right;  Arthroscopic  Repair  of  Subscapularis, Open Biceps Tenodesis  . SHOULDER SURGERY     bilateral- bones spur  . TONSILLECTOMY    . TOTAL KNEE ARTHROPLASTY    . TOTAL SHOULDER ARTHROPLASTY      Social History   Socioeconomic History  . Marital status: Married    Spouse name: Not on file  . Number of children: 2  . Years of education: 1  . Highest education level: Not on file  Social Needs  . Financial resource strain: Not on file  . Food insecurity - worry: Not on file  . Food insecurity - inability: Not on file  . Transportation needs - medical: Not on file  . Transportation needs - non-medical: Not on file  Occupational History  . Occupation: Retired  Tobacco Use  . Smoking status: Never Smoker  . Smokeless tobacco: Never Used  Substance and Sexual Activity  . Alcohol use: No    Alcohol/week: 0.0 oz  . Drug use: No  . Sexual activity: Not on file  Other Topics Concern  . Not on file  Social History Narrative   Patient drinks about 3-4 cups of caffeine daily.   Patient is right handed.    Family History  Problem Relation Age of Onset  . Heart attack Mother   . Stroke Mother   . Diabetes Mother   . Heart disease Mother   . Colon polyps Mother   . Asthma Mother   . Breast cancer Maternal Grandmother   . Wilson's disease Maternal Grandmother   . Pancreatic cancer Maternal Grandfather   . Heart disease Father   . Heart attack Father   . Asthma Father   . Stroke Sister   . Hypertension Sister   . Rheum arthritis Sister   . Dementia Sister   . Asthma Sister   . Lupus Sister   . Asthma Sister     Outpatient Encounter Medications as of 01/10/2018  Medication Sig  . AMBULATORY NON FORMULARY MEDICATION Medication Name: One touch ultra strips. Check fasting blood sugar in the morning and as needed.  Dx - [id number] Fax to [phone]  . atorvastatin (LIPITOR) 40 MG  tablet Take 1 tablet (40 mg total) by mouth daily.  . calcium carbonate (OS-CAL) 600 MG TABS Take 600 mg by mouth daily with breakfast.   . cetirizine (  ZYRTEC) 10 MG tablet Take 10 mg by mouth as needed for allergies.  . Cyanocobalamin (VITAMIN B-12 IJ) Inject 1,000 mg as directed every 28 (twenty-eight) days.  Marland Kitchen denosumab (PROLIA) 60 MG/ML SOLN injection Inject 60 mg into the skin every 6 (six) months. Administer in upper arm, thigh, or abdomen  . diclofenac sodium (VOLTAREN) 1 % GEL Apply topically as needed.   . donepezil (ARICEPT) 5 MG tablet Take 1 tablet (5 mg total) by mouth at bedtime.  . fesoterodine (TOVIAZ) 8 MG TB24 tablet Take 8 mg by mouth daily.  . furosemide (LASIX) 20 MG tablet Take 1 tablet (20 mg total) by mouth daily.  Marland Kitchen gabapentin (NEURONTIN) 300 MG capsule TAKE 1 TO 2 CAPSULES BY MOUTH UP TO TWO TIMES DAILY AS NEEDED  . ibuprofen (ADVIL,MOTRIN) 800 MG tablet Take 1 tablet (800 mg total) by mouth daily with supper.  Marland Kitchen ipratropium (ATROVENT) 0.03 % nasal spray Place 2 sprays into both nostrils every 12 (twelve) hours as needed for rhinitis.  Marland Kitchen leflunomide (ARAVA) 20 MG tablet Take 1 tablet (20 mg total) by mouth daily.  . Melatonin 5 MG TABS Take 5 mg by mouth at bedtime as needed.  . memantine (NAMENDA) 10 MG tablet Take 1 tablet (10 mg total) by mouth 2 (two) times daily.  . metFORMIN (GLUCOPHAGE-XR) 500 MG 24 hr tablet TAKE 1 TABLET EVERY DAY WITH BREAKFAST  . metoprolol tartrate (LOPRESSOR) 100 MG tablet Take 1.5 tablets (150 mg total) by mouth 2 (two) times daily.  . NON FORMULARY Place into the nose at bedtime. cpap machine  . ONE TOUCH ULTRA TEST test strip USE TO TEST BLOOD SUGAR ONCE DAILY  . pantoprazole (PROTONIX) 40 MG tablet Take 40 mg by mouth 2 (two) times daily.  . primidone (MYSOLINE) 50 MG tablet TAKE 1 TABLET AT BEDTIME  . Probiotic Product (PROBIOTIC ADVANCED PO) Take 1 capsule by mouth daily.  . propranolol (INDERAL) 10 MG tablet Take 1 tablet (10 mg  total) by mouth 4 (four) times daily as needed.  . SUMAtriptan (IMITREX) 50 MG tablet Take 1 tablet (50 mg total) by mouth every 2 (two) hours as needed.  . venlafaxine XR (EFFEXOR-XR) 75 MG 24 hr capsule TAKE 1 CAPSULE BY MOUTH  DAILY WITH BREAKFAST  . vitamin D, CHOLECALCIFEROL, 400 UNITS tablet Take 400 Units by mouth daily.    No facility-administered encounter medications on file as of 01/10/2018.         Objective:   Physical Exam  Constitutional: She is oriented to person, place, and time. She appears well-developed and well-nourished.  HENT:  Head: Normocephalic and atraumatic.  Right Ear: External ear normal.  Left Ear: External ear normal.  Nose: Nose normal.  Mouth/Throat: Oropharynx is clear and moist.  TMs and canals are clear.   Eyes: Conjunctivae and EOM are normal. Pupils are equal, round, and reactive to light.  Neck: Neck supple. No thyromegaly present.  Cardiovascular: Normal rate, regular rhythm and normal heart sounds.  Pulmonary/Chest: Effort normal and breath sounds normal. She has no wheezes.  Musculoskeletal:  Positive Dix-Hallpike maneuver with horizontal nystagmus to the right.  She felt nauseated when she sat up.  Lymphadenopathy:    She has no cervical adenopathy.  Neurological: She is alert and oriented to person, place, and time.  Skin: Skin is warm and dry.  The lump on the left anterior wrist is not indurated.  It is also not firm or hard like an abscess.  It is quite  soft.  And there is a little bit of bruising over the area.  Psychiatric: She has a normal mood and affect. Her behavior is normal.        Assessment & Plan:  Benign paroxysmal positional vertigo-discussed diagnosis.  Positive Dix-Hallpike maneuver to the right.  Given handout with exercises to do on her own at home and showed her how to do those today.  She Artie has some Antivert at home which she can certainly use as needed.  If she is not improving over the week please give [place] a  call we will put her into physical therapy for vestibular rehab.  Soft tissue swelling-recommend a compressive Ace wrap for comfort as well as icing a couple times a day.  If not improving, any erythema, or develops a fever then needs to see her orthopedist immediately otherwise I think it is just soft tissue swelling.  Her incision over her hand that she have her carpal tunnel is actually healing very nicely.  The skin is peeling a little.

## 2018-01-14 DIAGNOSIS — R350 Frequency of micturition: Secondary | ICD-10-CM | POA: Diagnosis not present

## 2018-01-14 DIAGNOSIS — N3941 Urge incontinence: Secondary | ICD-10-CM | POA: Diagnosis not present

## 2018-01-17 ENCOUNTER — Other Ambulatory Visit: Payer: Self-pay | Admitting: Adult Health

## 2018-01-21 ENCOUNTER — Other Ambulatory Visit: Payer: Self-pay

## 2018-01-21 DIAGNOSIS — H8111 Benign paroxysmal vertigo, right ear: Secondary | ICD-10-CM

## 2018-01-21 NOTE — Progress Notes (Signed)
Patient reports the home exercises have not helped. She would like to go ahead with PT. Order placed.

## 2018-01-24 ENCOUNTER — Other Ambulatory Visit: Payer: Self-pay | Admitting: Physician Assistant

## 2018-01-24 NOTE — Telephone Encounter (Signed)
Last Visit: 01/06/18 Next Visit: 06/09/18 Labs: 12/08/17 Glucose mildly elevated. All other labs are WNL  Okay to refill per Dr. [name]

## 2018-01-25 ENCOUNTER — Encounter

## 2018-01-25 NOTE — Telephone Encounter (Signed)
Pt aware of message below and verbalized understanding. No further questions or concerns from pt at this time.

## 2018-01-25 NOTE — Telephone Encounter (Signed)
Medicare will only cover for 1 test strip a day    Suggest checking once daily at most but vary the times    Fasting on day 1, 2 hrs after breakfast day2, 2 hrs after lunch day3, 2 hrs after dinner day4 then repeat the cycle  She doesn't have to check multiple times in a day, just vary the times

## 2018-01-25 NOTE — Telephone Encounter (Signed)
Pt asking if DR can increase the amount of test strips  Ordered , she said Dr Ky Barban wanted her to test more frequently during the day and that she runs out before it is time to refill again

## 2018-01-25 NOTE — Telephone Encounter (Signed)
Patient has refills at [facility].  Called to confirm and they said Rx is set to autofill April 8th.  No further action needed.

## 2018-01-27 DIAGNOSIS — G5602 Carpal tunnel syndrome, left upper limb: Secondary | ICD-10-CM | POA: Diagnosis not present

## 2018-01-27 DIAGNOSIS — H43393 Other vitreous opacities, bilateral: Secondary | ICD-10-CM | POA: Diagnosis not present

## 2018-01-27 DIAGNOSIS — E119 Type 2 diabetes mellitus without complications: Secondary | ICD-10-CM | POA: Diagnosis not present

## 2018-01-27 LAB — HM DIABETES EYE EXAM

## 2018-02-03 ENCOUNTER — Encounter: Payer: Self-pay | Admitting: Family Medicine

## 2018-02-03 ENCOUNTER — Ambulatory Visit: Payer: Medicare Other | Admitting: Rehabilitative and Restorative Service Providers"

## 2018-02-03 ENCOUNTER — Ambulatory Visit (INDEPENDENT_AMBULATORY_CARE_PROVIDER_SITE_OTHER): Payer: Medicare Other | Admitting: Family Medicine

## 2018-02-03 VITALS — BP 119/69 | Ht 66.0 in | Wt 177.0 lb

## 2018-02-03 DIAGNOSIS — E538 Deficiency of other specified B group vitamins: Secondary | ICD-10-CM

## 2018-02-03 DIAGNOSIS — M797 Fibromyalgia: Secondary | ICD-10-CM

## 2018-02-03 DIAGNOSIS — H9193 Unspecified hearing loss, bilateral: Secondary | ICD-10-CM | POA: Diagnosis not present

## 2018-02-03 DIAGNOSIS — H8111 Benign paroxysmal vertigo, right ear: Secondary | ICD-10-CM | POA: Diagnosis not present

## 2018-02-03 DIAGNOSIS — R7301 Impaired fasting glucose: Secondary | ICD-10-CM

## 2018-02-03 DIAGNOSIS — I10 Essential (primary) hypertension: Secondary | ICD-10-CM

## 2018-02-03 DIAGNOSIS — R0989 Other specified symptoms and signs involving the circulatory and respiratory systems: Secondary | ICD-10-CM | POA: Diagnosis not present

## 2018-02-03 LAB — POCT GLYCOSYLATED HEMOGLOBIN (HGB A1C): Hemoglobin A1C: 6

## 2018-02-03 MED ORDER — CYANOCOBALAMIN 1000 MCG/ML IJ SOLN
1000.0000 ug | Freq: Once | INTRAMUSCULAR | Status: AC
  Administered 2018-02-03: 1000 ug via INTRAMUSCULAR

## 2018-02-03 NOTE — Progress Notes (Signed)
B12 injection given in LUOQ pt tolerated well. She will RTC in 30 days for next injection.Mia [name], [place]

## 2018-02-03 NOTE — Progress Notes (Signed)
Subjective:    CC: BP, tremor, fibro, B12 shot  HPI:  Hypertension- Pt denies chest pain, SOB, dizziness, or heart palpitations.  Taking meds as directed w/o problems.  Denies medication side effects.    F/U fibromyalgia -she is actually felt really well over the last week she says in fact is the best she is gotten a really long time.  She was put on prednisone for some inflammation by Dr. Grandville Silos who did her wrist surgery.  She just finished her last tab.  She did note that it increased her appetite.  Impaired fasting glucose-no increased thirst or urination. No symptoms consistent with hypoglycemia.  Follow-up BPPV.  Unfortunately we no longer offer vestibular rehab here in our building so we have been trying to get in touch with her to see if she was willing to go to [place] or if she prefer to stay in [place].  She says she prefers to stay in [place].  She still continues to have a runny nose.  She ran out of her Atrovent and would like a refill.  She is also taking Claritin.  She also complains of still feeling like she is not hearing as well.  We irrigated her ears to see if that was the cause that she had a lot of cerumen bilaterally.  She says she still feels like she cannot quite hear well and has been asking her husband to turn up the TV.  Though she says she also has seasonal allergies which could be contributing.  Pernicious anemia-due for next B12 injection today.   Past medical history, Surgical history, Family history not pertinant except as noted below, Social history, Allergies, and medications have been entered into the medical record, reviewed, and corrections made.   Review of Systems: No fevers, chills, night sweats, weight loss, chest pain, or shortness of breath.   Objective:    General: Well Developed, well nourished, and in no acute distress.  Neuro: Alert and oriented x3, extra-ocular muscles intact, sensation grossly intact.  HEENT: Normocephalic,  atraumatic  Skin: Warm and dry, no rashes. Cardiac: Regular rate and rhythm, no murmurs rubs or gallops, no lower extremity edema.  Respiratory: Clear to auscultation bilaterally. Not using accessory muscles, speaking in full sentences.   Impression and Recommendations:    HTN - Well controlled. Continue current regimen. Follow up in  59months.   Fibromyalgia -stable.  In fact she is doing well overall.  No recent flare in symptoms.  IFG -hemoglobin A1c up today at 6.0 from previous of 5.8.  Discussed the importance of eating low-carb low sugar diet encouraged her to work on that.  Runny nose-recommended trial of Zyrtec instead of Claritin and will refill the Atrovent nasal spray.  Bilateral hearing loss-did recheck her ears today.  I do not see any signs of fluid infection.  We will do a screening hearing test on her today. She passed her hearing screen. Will refer to Audiometry.   BPPV -work on try to get her scheduled in [place] per her preference since we no longer offer it at our physical therapy here at the med center.  Pernicious anemia-B12 injection given today.

## 2018-02-04 DIAGNOSIS — N3941 Urge incontinence: Secondary | ICD-10-CM | POA: Diagnosis not present

## 2018-02-06 DIAGNOSIS — L989 Disorder of the skin and subcutaneous tissue, unspecified: Secondary | ICD-10-CM | POA: Diagnosis not present

## 2018-02-06 DIAGNOSIS — M25562 Pain in left knee: Secondary | ICD-10-CM | POA: Diagnosis not present

## 2018-02-06 DIAGNOSIS — T63461A Toxic effect of venom of wasps, accidental (unintentional), initial encounter: Secondary | ICD-10-CM | POA: Diagnosis not present

## 2018-02-15 ENCOUNTER — Encounter: Admit: 2018-02-15 | Discharge: 2018-02-15 | Payer: MEDICARE | Primary: Internal Medicine

## 2018-02-15 ENCOUNTER — Inpatient Hospital Stay: Admit: 2018-02-15 | Payer: MEDICARE | Primary: Internal Medicine

## 2018-02-15 DIAGNOSIS — E559 Vitamin D deficiency, unspecified: Secondary | ICD-10-CM

## 2018-02-16 LAB — HEMOGLOBIN A1C W/O EAG: Hemoglobin A1c: 7 % — ABNORMAL HIGH (ref 4.2–5.6)

## 2018-02-16 LAB — MICROALBUMIN, UR, RAND W/ MICROALB/CREAT RATIO
Creatinine, urine random: 150 mg/dL — ABNORMAL HIGH (ref 30–125)
Microalbumin,urine random: 1.81 MG/DL (ref 0–3.0)
Microalbumin/Creat ratio (mg/g creat): 12 mg/g (ref 0–30)

## 2018-02-16 LAB — LIPID PANEL
CHOL/HDL Ratio: 2.7 (ref 0–5.0)
Cholesterol, total: 124 MG/DL (ref <200)
HDL Cholesterol: 46 MG/DL (ref 40–60)
LDL, calculated: 56.2 MG/DL (ref 0–100)
Triglyceride: 109 MG/DL (ref <150)
VLDL, calculated: 21.8 MG/DL

## 2018-02-16 LAB — VITAMIN D, 25 HYDROXY: Vitamin D 25-Hydroxy: 56.1 ng/mL (ref 30–100)

## 2018-02-18 DIAGNOSIS — H8111 Benign paroxysmal vertigo, right ear: Secondary | ICD-10-CM | POA: Diagnosis not present

## 2018-02-21 DIAGNOSIS — H8111 Benign paroxysmal vertigo, right ear: Secondary | ICD-10-CM | POA: Diagnosis not present

## 2018-02-21 DIAGNOSIS — R1013 Epigastric pain: Secondary | ICD-10-CM | POA: Diagnosis not present

## 2018-02-21 DIAGNOSIS — R197 Diarrhea, unspecified: Secondary | ICD-10-CM | POA: Diagnosis not present

## 2018-02-21 DIAGNOSIS — R110 Nausea: Secondary | ICD-10-CM | POA: Diagnosis not present

## 2018-02-21 DIAGNOSIS — K219 Gastro-esophageal reflux disease without esophagitis: Secondary | ICD-10-CM | POA: Diagnosis not present

## 2018-02-21 DIAGNOSIS — K582 Mixed irritable bowel syndrome: Secondary | ICD-10-CM | POA: Diagnosis not present

## 2018-02-21 NOTE — Progress Notes (Signed)
The Prior Authorization request has been approved for Lansoprazole 30MG  OR CPDR.  The authorization is valid from 01/22/2018 through 02/21/2019. Approval faxed to [facility].

## 2018-02-22 ENCOUNTER — Encounter: Payer: Self-pay | Admitting: Cardiovascular Disease

## 2018-02-22 ENCOUNTER — Ambulatory Visit: Admit: 2018-02-22 | Discharge: 2018-02-22 | Payer: MEDICARE | Attending: Internal Medicine | Primary: Internal Medicine

## 2018-02-22 DIAGNOSIS — E119 Type 2 diabetes mellitus without complications: Secondary | ICD-10-CM

## 2018-02-22 MED ORDER — SITAGLIPTIN 100 MG TAB
100 mg | ORAL_TABLET | Freq: Every day | ORAL | 11 refills | Status: DC
Start: 2018-02-22 — End: 2019-01-16

## 2018-02-22 NOTE — Progress Notes (Signed)
Laura Roman presents today for   Chief Complaint   Patient presents with   ??? Diabetes     4 month follow up with labs              Depression Screening:  3 most recent PHQ Screens 02/22/2018   Little interest or pleasure in doing things Not at all   Feeling down, depressed, irritable, or hopeless Not at all   Total Score PHQ 2 0       Learning Assessment:  Learning Assessment 07/17/2015   PRIMARY LEARNER Patient   HIGHEST LEVEL OF EDUCATION - PRIMARY LEARNER  GRADUATED HIGH SCHOOL OR GED   BARRIERS PRIMARY LEARNER -   CO-LEARNER CAREGIVER -   PRIMARY LANGUAGE ENGLISH   LEARNER PREFERENCE PRIMARY READING     LISTENING     DEMONSTRATION   ANSWERED BY patient Laura Roman   RELATIONSHIP SELF       Abuse Screening:  Abuse Screening Questionnaire 11/23/2017   Do you ever feel afraid of your partner? N   Are you in a relationship with someone who physically or mentally threatens you? N   Is it safe for you to go home? Y       Fall Risk  Fall Risk Assessment, last 12 mths 02/22/2018   Able to walk? Yes   Fall in past 12 months? No           Coordination of Care:  1. Have you been to the ER, urgent care clinic since your last visit?   Hospitalized since your last visit? no    2. Have you seen or consulted any other health care providers outside of the [facility] since your last visit?Include any pap smears or colon screening. no

## 2018-02-22 NOTE — Progress Notes (Signed)
67 y.o. white female who presents for f/u    Denied any cardiovascular complaints.  She's trying to be active although no set exercise    No gi complaints. She continues to postpone the colo     We called DM at the last visit.  She had diarrhea with metformin so using acarbose at this time.  Denies polyuria, polydipsia, nocturia, vision change. Sugars are in the 120 range fasting mostly on review of her readings.  Not doing IF even though it worked while she was briefly on it    Vitals 02/22/2018 11/23/2017 05/11/2017 11/10/2016 08/25/2016   Weight 166 lb 166 lb 168 lb 168 lb 171 lb 9.6 oz     No sx referable to the thyroid    LAST MEDICARE WELLNESS EXAM: 11/10/16, 11/23/17    Past Medical History:   Diagnosis Date   ??? Allergic rhinitis    ??? Anxiety state, unspecified    ??? Arthritis     Dr. Synetta Shadow, Dr. Marisa Hua   ??? 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 Compression fx, thoracic spine ([place]) 2007    negative DEXA Dr. Marisa Hua   ??? Diabetes ([place]) 10/2017    on basis of fbs>125; intol metformin   ??? Dyslipidemia    ??? Dyspepsia    ??? Frozen shoulder     right Dr. Wynonia Hazard MRI   ??? Left thyroid nodule 04/2016    1cm nodule; apparently noted on CT 2008 and unchanged   ??? Multiple lung nodules     no change 10/06, 03/07, 03/08   ??? Overweight (BMI 25.0-29.9)     IF 7/18 start weight 168 lbs not doing    ??? Painless hematuria 04/2016    Dr Jimmye Norman, neg eval   ??? Palpitations 2008    neg thallium 2008, nl holter 2008, echo nl lv/ef 65%/tr mr/dd/nl pasp   ??? Prediabetes    ??? Syncope     neurocardiogenic by tilt 1994   ??? Venous insufficiency    ??? Vitamin D deficiency      Past Surgical History:   Procedure Laterality Date   ??? ECHO 2D ADULT  12/04    normal with EF 70%   ??? HX COLONOSCOPY      Dr Rosendo Gros 2007 negative   ??? HX GYN      s/p BTL    ??? HX HEMORRHOIDECTOMY      Dr. Rosendo Gros 2007   ??? HX HYSTERECTOMY  03/11/2017     Dr Sheral Flow   ??? HX ORTHOPAEDIC      DEXA -0.5 spine, -0.8 hip (1/18)   ??? HX UROLOGICAL  06/2016    Dr Jimmye Norman; bladder bx showed benign lesions   ??? STRESS TEST THALLIUM STUDY      2005 neg; 1/17 negative ef 75%   ??? [place] ABD COMP  9/05    negative   ??? VAS CAROTID DUPLEX BILATERAL  2004    negative     Social History     Socioeconomic History   ??? Marital status: MARRIED     Spouse name: Not on file   ??? Number of children: 2   ??? Years of education: Not on file   ??? Highest education level: Not on file   Occupational History   ??? Occupation: benefits specialist   Social Needs   ??? Financial  resource strain: Not on file   ??? Food insecurity:     Worry: Not on file     Inability: Not on file   ??? Transportation needs:     Medical: Not on file     Non-medical: Not on file   Tobacco Use   ??? Smoking status: Never Smoker   ??? Smokeless tobacco: Never Used   Substance and Sexual Activity   ??? Alcohol use: Yes     Alcohol/week: 2.4 oz     Types: 4 Glasses of wine per week   ??? Drug use: No   ??? Sexual activity: Not on file   Lifestyle   ??? Physical activity:     Days per week: Not on file     Minutes per session: Not on file   ??? Stress: Not on file   Relationships   ??? Social connections:     Talks on phone: Not on file     Gets together: Not on file     Attends religious service: Not on file     Active member of club or organization: Not on file     Attends meetings of clubs or organizations: Not on file     Relationship status: Not on file   ??? Intimate partner violence:     Fear of current or ex partner: Not on file     Emotionally abused: Not on file     Physically abused: Not on file     Forced sexual activity: Not on file   Other Topics Concern   ??? Not on file   Social History Narrative   ??? Not on file     Current Outpatient Medications   Medication Sig   ??? SITagliptin (JANUVIA) 100 mg tablet Take 1 Tab by mouth daily.   ??? citalopram (CELEXA) 10 mg tablet take 1 tablet by mouth once daily    ??? lansoprazole (PREVACID) 30 mg capsule TAKE 1 CAPSULE BY MOUTH BEFORE BREAKFAST DAILY.   ??? Blood-Glucose Meter monitoring kit Use daily as directed   ??? glucose blood VI test strips (BLOOD GLUCOSE TEST) strip Use daily as directed   ??? lancets misc Use daily as directed   ??? ergocalciferol (VITAMIN D2) 50,000 unit capsule Take 50,000 Units by mouth every thirty (30) days.   ??? atorvastatin (LIPITOR) 40 mg tablet take 1 tablet by mouth once daily   ??? omega-3 fatty acids (FISH OIL) cap Take 2 Tabs by mouth daily.   ??? aspirin 81 mg chewable tablet Take 81 mg by mouth daily.     No current facility-administered medications for this visit.      Allergies   Allergen Reactions   ??? Fish Oil Nausea Only   ??? Metformin Diarrhea   ??? Relafen [Nabumetone] Nausea Only     REVIEW OF SYSTEMS: gyn 4/18 Dr Andrew Au, Clotilde Dieter 3/18, colo 2007 Dr Rosendo Gros, DEXA 1/18 Dr Margaretann Loveless ??? no vision change or eye pain  Oral ??? no mouth pain, tongue or tooth problems  Ears ??? no hearing loss, ear pain, fullness, no swallowing problems  Cardiac ??? no CP, PND, orthopnea, edema, palpitations or syncope  Chest ??? no breast masses  Resp ??? no wheezing, chronic coughing, dyspnea  Urinary ??? no dysuria, hematuria, flank pain, urgency, frequency    Visit Vitals  BP 117/72   Pulse 83   Temp 98.7 ??F (37.1 ??C) (Oral)   Resp 14   Ht '5\' 4"'$  (1.626 m)  Wt 166 lb (75.3 kg)   SpO2 96%   BMI 28.49 kg/m??   Affect is appropriate.  Mood stable  No apparent distress  HEENT --Anicteric sclerae.  No JVD, or bruits.  Thyroid fullness on left  Lungs --Clear to auscultation and percussion, normal percussion.  Heart --Regular rate and rhythm, no murmurs, rubs, gallops, or clicks.  Abdomen -- Soft and nontender, no hepatosplenomegaly or masses.  Extremities -- Without cyanosis, clubbing, edema. 2+ pulses equally and bilaterally.    LABS  From 5/10 showed   gluc 110, cr 0.70,               alt 23,                                     chol 154, tg 143, hdl 45, ldl-c 80,                                                            tsh 1.91  From 5/10 showed                      2 hr GTT 89  From 8/10 showed                                                               vit d 23.0, ck 57, aldo 5.4  From 8/11 showed   gluc 108,                                     hba1c 6.3,                   chol 160, tg 172, hdl 39, ldl-c 87,  wbc 5.,7 hb 12.3, plt 235, ua neg,     tsh 2.28  From 5/12 showed   gluc 113, cr 0.71, gfr 94,  alt 16, hba1c 6.2, ldl-p 1989, chol 177, tg 161, hdl 43, ldl-c 102  From 11/12 showed                                                     hba1c 5.9, ldl-p 1218, chol 133, tg 116, hdl 45, ldl-c 65  From 5/13 showed   gluc 105, cr 0.55, gfr 102, alt 8,  hba1c 6.2,                   chol 148, tg 107, hdl 45, ldl-c 82,  wbc 5.0, hb 12.5, plt 172, vit d 40.3  From 11/13 showed        hba1c 6.2, ldl-p 1888, chol 176, tg 155, hdl 49, ldl-c 86,  wbc 6.2, hb 12.3, plt 182, vit d 34.4  From 5/14 showed        hba1c  6.2,     chol 152, tg 157, hdl 39, ldl-c 82  From 1/15 showed   gluc 113, cr 0.68, gfr>60, alt 11, hba1c 6.2,     chol 146, tg 130, hdl 43, ldl-c 77  From 7/15 showed        hba1c 6.3,     chol 133, tg 124, hdl 39, ldl-c 69, wbc 6.3, hb 12.2, plt 193  From 6/16 showed   gluc 106, cr 0.74, gfr>60, alt 26, hba1c 6.3,     chol 126, tg 125, hdl 46, ldl-c 55, wbc 5.7, hb 13.2, plt 203, vit d 7.2,   tsh 1.69  From 12/16 showed gluc 110, cr 0.68, gfr>60, alt 30, hba1c 6.1,     chol 135, tg 147, hdl 47, ldl-c 59, wbc 6.0, hb 12.7, plt 197  From 6/17 showed   gluc 122, cr 0.71, gfr>60, alt 33, hba1c 6.2,     chol 153, tg 140, hdl 46, ldl-c 79, wbc 6.3, hb 13.1, plt 190,        tsh 1.92, hep c neg  From 1/18 showed        hba1c 6.2,    chol 154, tg 181, hdl 41, ldl-c 77  From 7/18 showed   gluc 129, cr 0.63, gfr>60, alt 26, hba1c 6.1,              wbc 6.0, hb 12.4, plt 193, vit d 79.0   From 1/19 showed   gluc 132, cr 0.83, gfr>60,    hba1c 6.1    Results for orders placed or performed during the hospital encounter of 02/15/18   VITAMIN D, 25 HYDROXY   Result Value Ref Range    Vitamin D 25-Hydroxy 56.1 30 - 100 ng/mL   HEMOGLOBIN A1C W/O EAG   Result Value Ref Range    Hemoglobin A1c 7.0 (H) 4.2 - 5.6 %   MICROALBUMIN, UR, RAND W/ MICROALB/CREAT RATIO   Result Value Ref Range    Microalbumin,urine random 1.81 0 - 3.0 MG/DL    Creatinine, urine 150.00 (H) 30 - 125 mg/dL    Microalbumin/Creat ratio (mg/g creat) 12 0 - 30 mg/g   LIPID PANEL   Result Value Ref Range    LIPID PROFILE          Cholesterol, total 124 <200 MG/DL    Triglyceride 109 <150 MG/DL    HDL Cholesterol 46 40 - 60 MG/DL    LDL, calculated 56.2 0 - 100 MG/DL    VLDL, calculated 21.8 MG/DL    CHOL/HDL Ratio 2.7 0 - 5.0       Patient Active Problem List   Diagnosis Code   ??? Vitamin D deficiency E55.9   ??? Anxiety F41.9   ??? Dyslipidemia E78.5   ??? 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 Overweight (BMI 25.0-29.9) E66.3   ??? Controlled type 2 diabetes mellitus, without long-term current use of insulin (HCC) E11.9     Assessment and plan:  1. DM.  Stop acarbose.  Trial januvia after discussing possible sfx.  Declined glp and sglt2 for now due to cost issues  2. Hyperlipidemia. Continue current  3. Hypovitaminosis D. Continue current regimen.  4. Anxiety.  Continue celexa  5. Overweight.  Lifestyle and dietary measures.  Portion control reiterated.  6. Colo to be scheduled by her w Dr Rosendo Gros  7. Endometrial ca.  Per Dr Sheral Flow        RTC 8/19    Above conditions discussed  at length and patient vocalized understanding.  All questions answered to patient satisfaction

## 2018-02-22 NOTE — ACP (Advance Care Planning) (Signed)
Advance Directive:  1. Do you have an advance directive in place? Patient Reply:YES    2.  Per patient no changes to their ACP contact NO    Patient will bring in a copy on next visit. Marland Kitchen

## 2018-02-23 ENCOUNTER — Other Ambulatory Visit: Payer: Self-pay | Admitting: Family Medicine

## 2018-02-24 DIAGNOSIS — R1013 Epigastric pain: Secondary | ICD-10-CM | POA: Diagnosis not present

## 2018-02-24 DIAGNOSIS — K3189 Other diseases of stomach and duodenum: Secondary | ICD-10-CM | POA: Diagnosis not present

## 2018-02-25 DIAGNOSIS — N3941 Urge incontinence: Secondary | ICD-10-CM | POA: Diagnosis not present

## 2018-02-25 DIAGNOSIS — R350 Frequency of micturition: Secondary | ICD-10-CM | POA: Diagnosis not present

## 2018-02-28 ENCOUNTER — Other Ambulatory Visit: Payer: Self-pay | Admitting: Emergency Medicine

## 2018-02-28 ENCOUNTER — Ambulatory Visit (INDEPENDENT_AMBULATORY_CARE_PROVIDER_SITE_OTHER): Payer: Medicare Other | Admitting: Family Medicine

## 2018-02-28 ENCOUNTER — Other Ambulatory Visit: Payer: Self-pay

## 2018-02-28 VITALS — BP 114/57 | HR 66 | Temp 98.7°F | Resp 18

## 2018-02-28 DIAGNOSIS — M810 Age-related osteoporosis without current pathological fracture: Secondary | ICD-10-CM

## 2018-02-28 DIAGNOSIS — D510 Vitamin B12 deficiency anemia due to intrinsic factor deficiency: Secondary | ICD-10-CM

## 2018-02-28 MED ORDER — DENOSUMAB 60 MG/ML ~~LOC~~ SOSY
60.0000 mg | PREFILLED_SYRINGE | Freq: Once | SUBCUTANEOUS | Status: AC
  Administered 2018-02-28: 60 mg via SUBCUTANEOUS

## 2018-02-28 MED ORDER — CYANOCOBALAMIN 1000 MCG/ML IJ SOLN
1000.0000 ug | Freq: Once | INTRAMUSCULAR | Status: AC
  Administered 2018-02-28: 1000 ug via INTRAMUSCULAR

## 2018-02-28 MED ORDER — ONDANSETRON HCL 4 MG PO TABS: ORAL_TABLET | ORAL | 0 refills | Status: DC

## 2018-02-28 NOTE — Progress Notes (Signed)
Agree with documentation as above.   Catherine Metheney, MD  

## 2018-02-28 NOTE — Progress Notes (Signed)
   Subjective:    Patient ID: Mia [name], female    DOB: Feb 19, 1951, 67 y.o.   MRN: [id number]  HPI Patient here for injections of Prolia and B12. She denies any/all adverse side effects of these medications and is pleased with both. She is feeling well today and only request is for Zofran refill.    Review of Systems     Objective:   Physical Exam        Assessment & Plan:  Patient tolerated both injections well without complications.

## 2018-03-01 ENCOUNTER — Encounter: Payer: Self-pay | Admitting: Family Medicine

## 2018-03-02 DIAGNOSIS — J3489 Other specified disorders of nose and nasal sinuses: Secondary | ICD-10-CM | POA: Diagnosis not present

## 2018-03-02 DIAGNOSIS — H838X3 Other specified diseases of inner ear, bilateral: Secondary | ICD-10-CM | POA: Diagnosis not present

## 2018-03-02 DIAGNOSIS — H908 Mixed conductive and sensorineural hearing loss, unspecified: Secondary | ICD-10-CM | POA: Diagnosis not present

## 2018-03-02 DIAGNOSIS — H8111 Benign paroxysmal vertigo, right ear: Secondary | ICD-10-CM | POA: Diagnosis not present

## 2018-03-02 DIAGNOSIS — H90A32 Mixed conductive and sensorineural hearing loss, unilateral, left ear with restricted hearing on the contralateral side: Secondary | ICD-10-CM | POA: Diagnosis not present

## 2018-03-02 DIAGNOSIS — R51 Headache: Secondary | ICD-10-CM | POA: Diagnosis not present

## 2018-03-04 DIAGNOSIS — H8111 Benign paroxysmal vertigo, right ear: Secondary | ICD-10-CM | POA: Diagnosis not present

## 2018-03-07 ENCOUNTER — Other Ambulatory Visit: Payer: Self-pay | Admitting: Adult Health

## 2018-03-07 DIAGNOSIS — K219 Gastro-esophageal reflux disease without esophagitis: Secondary | ICD-10-CM | POA: Diagnosis not present

## 2018-03-07 DIAGNOSIS — K582 Mixed irritable bowel syndrome: Secondary | ICD-10-CM | POA: Diagnosis not present

## 2018-03-07 DIAGNOSIS — R1013 Epigastric pain: Secondary | ICD-10-CM | POA: Diagnosis not present

## 2018-03-07 DIAGNOSIS — R110 Nausea: Secondary | ICD-10-CM | POA: Diagnosis not present

## 2018-03-07 DIAGNOSIS — R197 Diarrhea, unspecified: Secondary | ICD-10-CM | POA: Diagnosis not present

## 2018-03-07 DIAGNOSIS — N281 Cyst of kidney, acquired: Secondary | ICD-10-CM | POA: Diagnosis not present

## 2018-03-09 DIAGNOSIS — G5602 Carpal tunnel syndrome, left upper limb: Secondary | ICD-10-CM | POA: Diagnosis not present

## 2018-03-11 ENCOUNTER — Encounter: Payer: Self-pay | Admitting: Cardiovascular Disease

## 2018-03-11 ENCOUNTER — Ambulatory Visit (INDEPENDENT_AMBULATORY_CARE_PROVIDER_SITE_OTHER): Payer: Medicare Other | Admitting: Cardiovascular Disease

## 2018-03-11 ENCOUNTER — Other Ambulatory Visit: Payer: Self-pay

## 2018-03-11 VITALS — BP 152/76 | HR 79 | Ht 66.6 in | Wt 182.0 lb

## 2018-03-11 DIAGNOSIS — I471 Supraventricular tachycardia: Secondary | ICD-10-CM

## 2018-03-11 DIAGNOSIS — I10 Essential (primary) hypertension: Secondary | ICD-10-CM | POA: Diagnosis not present

## 2018-03-11 DIAGNOSIS — Z79899 Other long term (current) drug therapy: Secondary | ICD-10-CM | POA: Diagnosis not present

## 2018-03-11 MED ORDER — LOSARTAN POTASSIUM 50 MG PO TABS: 50.0000 mg | ORAL_TABLET | Freq: Every day | ORAL | 3 refills | Status: DC

## 2018-03-11 NOTE — Patient Instructions (Signed)
Your physician has recommended you make the following change in your medication:  1.) start losartan 50 mg once a day  Your physician recommends that you return for lab work in: 3 weeks (BMET)  Your physician wants you to follow-up in: 6 months with Dr. Acie Fredrickson. You will receive a reminder letter in the mail two months in advance. If you don't receive a letter, please call our office to schedule the follow-up appointment.

## 2018-03-11 NOTE — Progress Notes (Signed)
Cardiology Office Note   Date:  03/11/2018   ID:  Mia [name], DOB 1951/09/19, MRN [id number]  PCP:  Hali Marry, MD  Cardiologist:   Mertie Moores, MD   Chief Complaint  Patient presents with  . Follow-up    SVT, hyperlipidemia    1. SVT 2. Raynaud's Phenomenon 3. Diabetes Mellitus 4. Minimal CAD by cath 2006 [name]) 5. Sleep apnea - CPAP is currently not working  02/11/05 Right heart catheterization:  RA pressure: 8 mmHg  RV pressure: 25/7 mmHg  PA pressure: 20/9 mmHg  PWCP: 9 mmHg  CO: 4.6 liters per minute  LEFT HEART CATHETERIZATION RESULTS:  1. Left main: No significant disease.  2. LAD: Mild luminal irregularities.  3. First and second diagonal: Moderate size, mild luminal irregularities.  4. Left circumflex: Nondominant, mild luminal irregularities.  5. RCA: Dominant with mild luminal irregularities.  6. LV: EF is 55 to 60%, no wall motion abnormalities. LV EDP was 9 mmHg.   December 28, 2014:  Mia [name] is a 67 y.o. female who presents for follow-up with an episode of chest pain. She was seen by our nurse practitioner, Ignacia Bayley.    Diltiazem was DC'd.   She was started on Metoprolol 50 bid.   Myoview study was normal. She has normal left ventricle systolic function. She is going to the [facility] 3 times a week.  Doing well.     She is feeling much better.    Sept. 1, 2016:  Has had C diff for the past several months .  Doing well from a cardiac standpoint .  Has been fatigued.  Doing well with the metoprolol  Still has shortness of breath .   Nov. 3, 2017:    Mia [name] is seen today with her husband, [name].   Last week she started having some heart facing .  HR was 187 (using the app on her phone )  Could feel it in her ears. Got a little dizzy.  Sat down and relaxed and the episode resolved.  Had another episode this past Sunday .  HR stayed elevated for 2 hours   Aug. 24, 2018: Mia [name] is seen today with husband [name].    Followed for HTN and hyperlipidemia  Has had some elevated diastolic BP readings . Eats some salty foods still - likes ham. Complains of leg pain / ache when she is standing .    Nov. 16, 2018  Mia [name] was having more SVT, we have increased the metoprolol  Up to metoprolol 100 mg BID. She is much better controlled at this point Wearing the event monitor   December 08, 2017:  Mia [name] is seen today for follow-up of her supraventricular tachycardia  We had increased her metoprolol to 100 mg BID  Has occasional palpitations  No CP ,  No severe dyspnea. Does has some DOE that is likely due to generalized deconditioning  Needs left knee replacement this summer. Needs carple tunnel surgery in both wrists.  Has episodes of diaphoresis   Mar 11, 2018: Mia [name] is seen today for follow-up of her supraventricular tachycardia.  We increased her metoprolol up to 150 mill grams twice a day during her last visit. Tolerating the metoprolol well  BP is still is elevated at times.     Eats lots of sandwiches, salami,  Her diet is "terrible"  According to her . - eats snacks instead of real meals, sandwiches,   She is surprised that she hasnt lost  any weight .    Past Medical History:  Diagnosis Date  . Anal fissure   . Anemia   . Arthritis   . Blood transfusion   . C. difficile colitis   . Chest pain    a. 01/2013 MV: EF 59%, no ischemia.  . Chronic Dyspnea    a. 01/2013 Echo: EF 60-65%, Gr 1 DD, PASP 33mmHg.  . Diabetes mellitus   . Fibromyalgia   . Gall stones   . GERD (gastroesophageal reflux disease)    gastritis  . Hemorrhoids   . Hypertension   . Interstitial cystitis   . MCI (mild cognitive impairment) 11/25/2017  . Memory difficulty 12/14/2013  . Neuromuscular disorder ([place])    sclerosis  . Osteoporosis   . Peptic ulcer   . PONV (postoperative nausea and vomiting)   . Pseudogout   . Raynaud phenomenon   . Rectal bleeding   . Sleep apnea    a. on cpap.  Marland Kitchen SVT (supraventricular  tachycardia) ([place])   . Syncope 09/10/2015  . Thyroid disease    hypothyroidism  . Tremor, essential 05/14/2016    Past Surgical History:  Procedure Laterality Date  . APPENDECTOMY    . BLADDER SURGERY     x2  . BLADDER SURGERY    . BREAST BIOPSY    . CARDIAC CATHETERIZATION    . CARPAL TUNNEL RELEASE    . CATARACT EXTRACTION Bilateral   . CHOLECYSTECTOMY    . DILATION AND CURETTAGE OF UTERUS    . ENTEROCELE REPAIR     x2  . EYE SURGERY     retina  . hysterectomy - unknown type    . KNEE ARTHROPLASTY    . KNEE SURGERY     x6  . NECK SURGERY     fusion  . OOPHORECTOMY    . QUADRICEPS REPAIR Right   . RECTOCELE REPAIR     x2  . SHOULDER ARTHROSCOPY WITH SUBACROMIAL DECOMPRESSION, ROTATOR CUFF REPAIR AND BICEP TENDON REPAIR  10/06/2012   Procedure: SHOULDER ARTHROSCOPY WITH SUBACROMIAL DECOMPRESSION, ROTATOR CUFF REPAIR AND BICEP TENDON REPAIR;  Surgeon: Nita Sells, MD;  Location: [facility];  Service: Orthopedics;  Laterality: Right;  Arthroscopic  Repair  of  Subscapularis, Open Biceps Tenodesis  . SHOULDER SURGERY     bilateral- bones spur  . TONSILLECTOMY    . TOTAL KNEE ARTHROPLASTY    . TOTAL SHOULDER ARTHROPLASTY       Current Outpatient Medications  Medication Sig Dispense Refill  . AMBULATORY NON FORMULARY MEDICATION Medication Name: One touch ultra strips. Check fasting blood sugar in the morning and as needed.  Dx - [id number] Fax to [phone] 50 strip 11  . atorvastatin (LIPITOR) 40 MG tablet Take 1 tablet (40 mg total) by mouth daily. 90 tablet 3  . calcium carbonate (OS-CAL) 600 MG TABS Take 600 mg by mouth daily with breakfast.     . cetirizine (ZYRTEC) 10 MG tablet Take 10 mg by mouth as needed for allergies.    Marland Kitchen denosumab (PROLIA) 60 MG/ML SOLN injection Inject 60 mg into the skin every 6 (six) months. Administer in upper arm, thigh, or abdomen    . donepezil (ARICEPT) 5 MG tablet Take 1 tablet (5 mg total) by mouth at  bedtime. 90 tablet 3  . fesoterodine (TOVIAZ) 8 MG TB24 tablet Take 8 mg by mouth daily.    . furosemide (LASIX) 20 MG tablet Take 1 tablet (20 mg total) by mouth daily.  90 tablet 3  . gabapentin (NEURONTIN) 300 MG capsule TAKE 1 TO 2 CAPSULES BY MOUTH UP TO TWO TIMES DAILY AS NEEDED 180 capsule 3  . ibuprofen (ADVIL,MOTRIN) 800 MG tablet Take 1 tablet (800 mg total) by mouth daily with supper. 30 tablet 3  . leflunomide (ARAVA) 20 MG tablet TAKE 1 TABLET BY MOUTH EVERY DAY 30 tablet 2  . Melatonin 5 MG TABS Take 5 mg by mouth at bedtime as needed.    . memantine (NAMENDA) 10 MG tablet TAKE 1 TABLET (10 MG TOTAL) 2 TIMES DAILY. 180 tablet 4  . metFORMIN (GLUCOPHAGE-XR) 500 MG 24 hr tablet TAKE 1 TABLET EVERY DAY WITH BREAKFAST 90 tablet 1  . metoprolol tartrate (LOPRESSOR) 100 MG tablet Take 1.5 tablets (150 mg total) by mouth 2 (two) times daily. 270 tablet 3  . NON FORMULARY Place into the nose at bedtime. cpap machine    . ondansetron (ZOFRAN) 4 MG tablet TAKE 2 TABLETS BY MOUTH 2 TIMES A DAY AS NEEDED FOR NAUSEA FOR UP TO 4 DOSES. 20 tablet 0  . ONE TOUCH ULTRA TEST test strip USE TO TEST FASTING BLOOD SUGAR IN THE MORNING AND AS NEEDED 50 each 11  . pantoprazole (PROTONIX) 40 MG tablet Take 40 mg by mouth 2 (two) times daily.    . primidone (MYSOLINE) 50 MG tablet TAKE 1 TABLET AT BEDTIME 90 tablet 1  . Probiotic Product (PROBIOTIC ADVANCED PO) Take 1 capsule by mouth daily.    . propranolol (INDERAL) 10 MG tablet Take 1 tablet (10 mg total) by mouth 4 (four) times daily as needed. 90 tablet 6  . venlafaxine XR (EFFEXOR-XR) 75 MG 24 hr capsule TAKE 1 CAPSULE BY MOUTH  DAILY WITH BREAKFAST 90 capsule 3  . vitamin D, CHOLECALCIFEROL, 400 UNITS tablet Take 400 Units by mouth daily.      No current facility-administered medications for this visit.     Allergies:   Codeine; Myrbetriq  [mirabegron]; Percocet [oxycodone-acetaminophen]; Celebrex [celecoxib]; Darvocet [propoxyphene  n-acetaminophen]; Erythromycin; Hydrocodone; Lyrica [pregabalin]; Macrodantin [nitrofurantoin]; Toradol [ketorolac tromethamine]; Tramadol; and Verapamil    Social History:  The patient  reports that she has never smoked. She has never used smokeless tobacco. She reports that she does not drink alcohol or use drugs.   Family History:  The patient's family history includes Asthma in her father, mother, sister, and sister; Breast cancer in her maternal grandmother; Colon polyps in her mother; Dementia in her sister; Diabetes in her mother; Heart attack in her father and mother; Heart disease in her father and mother; Hypertension in her sister; Lupus in her sister; Pancreatic cancer in her maternal grandfather; Rheum arthritis in her sister; Stroke in her mother and sister; Wilson's disease in her maternal grandmother.    ROS: Noted in current history, all other systems are negative.   Physical Exam: Blood pressure (!) 152/76, pulse 79, height 5' 6.6" (1.692 m), weight 182 lb (82.6 kg), SpO2 98 %.  GEN:  Well nourished, well developed in no acute distress HEENT: Normal NECK: No JVD; No carotid bruits LYMPHATICS: No lymphadenopathy CARDIAC: RRR   RESPIRATORY:  Clear to auscultation without rales, wheezing or rhonchi  ABDOMEN: Soft, non-tender, non-distended MUSCULOSKELETAL:  No edema; No deformity  SKIN: Warm and dry NEUROLOGIC:  Alert and oriented x 3  EKG:        Recent Labs: 08/18/2017: TSH 1.10 12/08/2017: ALT 26; BUN 9; Creat 0.79; Hemoglobin 13.5; Platelets 362; Potassium 4.2; Sodium 136  Lipid Panel    Component Value Date/Time   CHOL 119 06/18/2017 0951   TRIG 161 (H) 06/18/2017 0951   HDL 40 06/18/2017 0951   CHOLHDL 3.0 06/18/2017 0951   CHOLHDL 3.2 08/28/2016 1558   VLDL 22 08/28/2016 1558   LDLCALC 47 06/18/2017 0951      Wt Readings from Last 3 Encounters:  03/11/18 182 lb (82.6 kg)  02/03/18 177 lb (80.3 kg)  01/10/18 172 lb (78 kg)      Other studies  Reviewed: Additional studies/ records that were reviewed today include: . Review of the above records demonstrates:    ASSESSMENT AND PLAN:  1. SVT -    her supraventricular tachycardia seems to be well controlled on current dose of metoprolol   2.  Hypertension: Her blood pressure is elevated today.  She is not eating well at all.  She eats snacks and salty foods instead of real meals.  We had a long discussion about getting back to a good diet and exercise program.  4. Minimal CAD by cath 2006 [name])  5. Sleep apnea -    6. Dyspnea on exertion:  I suspect this is deconditioning . Ive asked her to start a regular exercise program   Current medicines are reviewed at length with the patient today.  The patient does not have concerns regarding medicines.  The following changes have been made:  no change  Disposition:   FU with me in 6  months     Mertie Moores, MD  03/11/2018 10:49 AM    [facility] [place], [place], [place]  [address] Phone: [phone]; Fax: [phone]

## 2018-03-12 LAB — COMPLETE METABOLIC PANEL WITH GFR
AG Ratio: 1.7 (calc) (ref 1.0–2.5)
ALT: 31 U/L — ABNORMAL HIGH (ref 6–29)
AST: 30 U/L (ref 10–35)
Albumin: 3.9 g/dL (ref 3.6–5.1)
Alkaline phosphatase (APISO): 70 U/L (ref 33–130)
BUN: 10 mg/dL (ref 7–25)
CO2: 31 mmol/L (ref 20–32)
Calcium: 9.5 mg/dL (ref 8.6–10.4)
Chloride: 103 mmol/L (ref 98–110)
Creat: 0.75 mg/dL (ref 0.50–0.99)
GFR, Est African American: 96 mL/min/{1.73_m2} (ref >=60)
GFR, Est Non African American: 83 mL/min/{1.73_m2} (ref >=60)
Globulin: 2.3 g/dL (calc) (ref 1.9–3.7)
Glucose, Bld: 85 mg/dL (ref 65–99)
Potassium: 4 mmol/L (ref 3.5–5.3)
Sodium: 140 mmol/L (ref 135–146)
Total Bilirubin: 0.3 mg/dL (ref 0.2–1.2)
Total Protein: 6.2 g/dL (ref 6.1–8.1)

## 2018-03-12 LAB — CBC WITH DIFFERENTIAL/PLATELET
Basophils Absolute: 53 cells/uL (ref 0–200)
Basophils Relative: 0.8 %
Eosinophils Absolute: 99 cells/uL (ref 15–500)
Eosinophils Relative: 1.5 %
HCT: 36.3 % (ref 35.0–45.0)
Hemoglobin: 12.3 g/dL (ref 11.7–15.5)
Lymphs Abs: 1577 cells/uL (ref 850–3900)
MCH: 31.2 pg (ref 27.0–33.0)
MCHC: 33.9 g/dL (ref 32.0–36.0)
MCV: 92.1 fL (ref 80.0–100.0)
MPV: 10.2 fL (ref 7.5–12.5)
Monocytes Relative: 11.7 %
Neutro Abs: 4099 cells/uL (ref 1500–7800)
Neutrophils Relative %: 62.1 %
Platelets: 311 10*3/uL (ref 140–400)
RBC: 3.94 10*6/uL (ref 3.80–5.10)
RDW: 13 % (ref 11.0–15.0)
Total Lymphocyte: 23.9 %
WBC mixed population: 772 cells/uL (ref 200–950)
WBC: 6.6 10*3/uL (ref 3.8–10.8)

## 2018-03-14 NOTE — Progress Notes (Signed)
ALT mildly elevated. She should avoid all NSAIDS.

## 2018-03-15 DIAGNOSIS — H8111 Benign paroxysmal vertigo, right ear: Secondary | ICD-10-CM | POA: Diagnosis not present

## 2018-03-18 DIAGNOSIS — R350 Frequency of micturition: Secondary | ICD-10-CM | POA: Diagnosis not present

## 2018-03-18 DIAGNOSIS — N3941 Urge incontinence: Secondary | ICD-10-CM | POA: Diagnosis not present

## 2018-03-18 DIAGNOSIS — H8111 Benign paroxysmal vertigo, right ear: Secondary | ICD-10-CM | POA: Diagnosis not present

## 2018-03-22 ENCOUNTER — Other Ambulatory Visit: Payer: Self-pay | Admitting: Family Medicine

## 2018-03-22 ENCOUNTER — Other Ambulatory Visit: Payer: Self-pay | Admitting: Adult Health

## 2018-03-22 DIAGNOSIS — H8111 Benign paroxysmal vertigo, right ear: Secondary | ICD-10-CM | POA: Diagnosis not present

## 2018-03-25 DIAGNOSIS — H8111 Benign paroxysmal vertigo, right ear: Secondary | ICD-10-CM | POA: Diagnosis not present

## 2018-03-26 DIAGNOSIS — M545 Low back pain: Secondary | ICD-10-CM | POA: Diagnosis not present

## 2018-03-28 DIAGNOSIS — M47816 Spondylosis without myelopathy or radiculopathy, lumbar region: Secondary | ICD-10-CM | POA: Diagnosis not present

## 2018-03-29 ENCOUNTER — Encounter: Primary: Internal Medicine

## 2018-03-31 ENCOUNTER — Ambulatory Visit (INDEPENDENT_AMBULATORY_CARE_PROVIDER_SITE_OTHER): Payer: Medicare Other | Admitting: Family Medicine

## 2018-03-31 VITALS — BP 89/57 | HR 69 | Ht 66.5 in | Wt 181.0 lb

## 2018-03-31 DIAGNOSIS — E538 Deficiency of other specified B group vitamins: Secondary | ICD-10-CM

## 2018-03-31 DIAGNOSIS — I952 Hypotension due to drugs: Secondary | ICD-10-CM

## 2018-03-31 MED ORDER — CYANOCOBALAMIN 1000 MCG/ML IJ SOLN
1000.0000 ug | Freq: Once | INTRAMUSCULAR | Status: AC
  Administered 2018-03-31: 1000 ug via INTRAMUSCULAR

## 2018-03-31 NOTE — Progress Notes (Signed)
   Subjective:    Patient ID: Mia [name], female    DOB: 1951-08-24, 67 y.o.   MRN: [id number]  HPI Pt here for Vitamin B12 injection. Pt has no complaints of decreased energy level or SOB or palpitations.  BP low and states Cardiologist Dr. Jasper Riling added Losartan 50mg  to her regimen at last visit. Pt feels a little light headed today otherwise complains of no other symptom.KG LPN   Review of Systems     Objective:   Physical Exam        Assessment & Plan:  Per Dr. Madilyn Fireman patient advised to cut the Losartan in half until sees cardiologist again. I advised pt to keep a log of BP reading at home in the am and pm and if noticing her BP trending upwards to give the office a call and to make sure she is hydrating well with water. Pt agrees with plan. KG LPN

## 2018-03-31 NOTE — Progress Notes (Signed)
Agree with documentation as above.   Catherine Metheney, MD  

## 2018-04-01 ENCOUNTER — Other Ambulatory Visit: Payer: Self-pay | Admitting: *Deleted

## 2018-04-01 ENCOUNTER — Telehealth: Payer: Self-pay | Admitting: Cardiovascular Disease

## 2018-04-01 ENCOUNTER — Other Ambulatory Visit: Payer: Medicare Other | Admitting: *Deleted

## 2018-04-01 DIAGNOSIS — M545 Low back pain: Secondary | ICD-10-CM | POA: Diagnosis not present

## 2018-04-01 DIAGNOSIS — I10 Essential (primary) hypertension: Secondary | ICD-10-CM

## 2018-04-01 DIAGNOSIS — E876 Hypokalemia: Secondary | ICD-10-CM

## 2018-04-01 DIAGNOSIS — I471 Supraventricular tachycardia: Secondary | ICD-10-CM | POA: Diagnosis not present

## 2018-04-01 LAB — BASIC METABOLIC PANEL
BUN/Creatinine Ratio: 7 — ABNORMAL LOW (ref 12–28)
BUN: 6 mg/dL — ABNORMAL LOW (ref 8–27)
CO2: 26 mmol/L (ref 20–29)
Calcium: 9.6 mg/dL (ref 8.7–10.3)
Chloride: 98 mmol/L (ref 96–106)
Creatinine, Ser: 0.85 mg/dL (ref 0.57–1.00)
GFR calc Af Amer: 83 mL/min/{1.73_m2} (ref >59)
GFR calc non Af Amer: 72 mL/min/{1.73_m2} (ref >59)
Glucose: 125 mg/dL — ABNORMAL HIGH (ref 65–99)
Potassium: 3.2 mmol/L — ABNORMAL LOW (ref 3.5–5.2)
Sodium: 138 mmol/L (ref 134–144)

## 2018-04-01 MED ORDER — POTASSIUM CHLORIDE CRYS ER 20 MEQ PO TBCR: 20.0000 meq | EXTENDED_RELEASE_TABLET | Freq: Every day | ORAL | 3 refills | Status: DC

## 2018-04-01 NOTE — Telephone Encounter (Signed)
New Message   Pt returning call for Mia [name] about labs

## 2018-04-01 NOTE — Telephone Encounter (Signed)
See lab results ./cy 

## 2018-04-02 IMAGING — DX DG FOOT COMPLETE 3+V*R*
3 series · 3 of 3 positions shown · non-contrast
Comparison: None.

CLINICAL DATA: Dorsal right foot pain.  Reported history is injury.

EXAM:
RIGHT FOOT COMPLETE - 3+ VIEW

[foot ap]
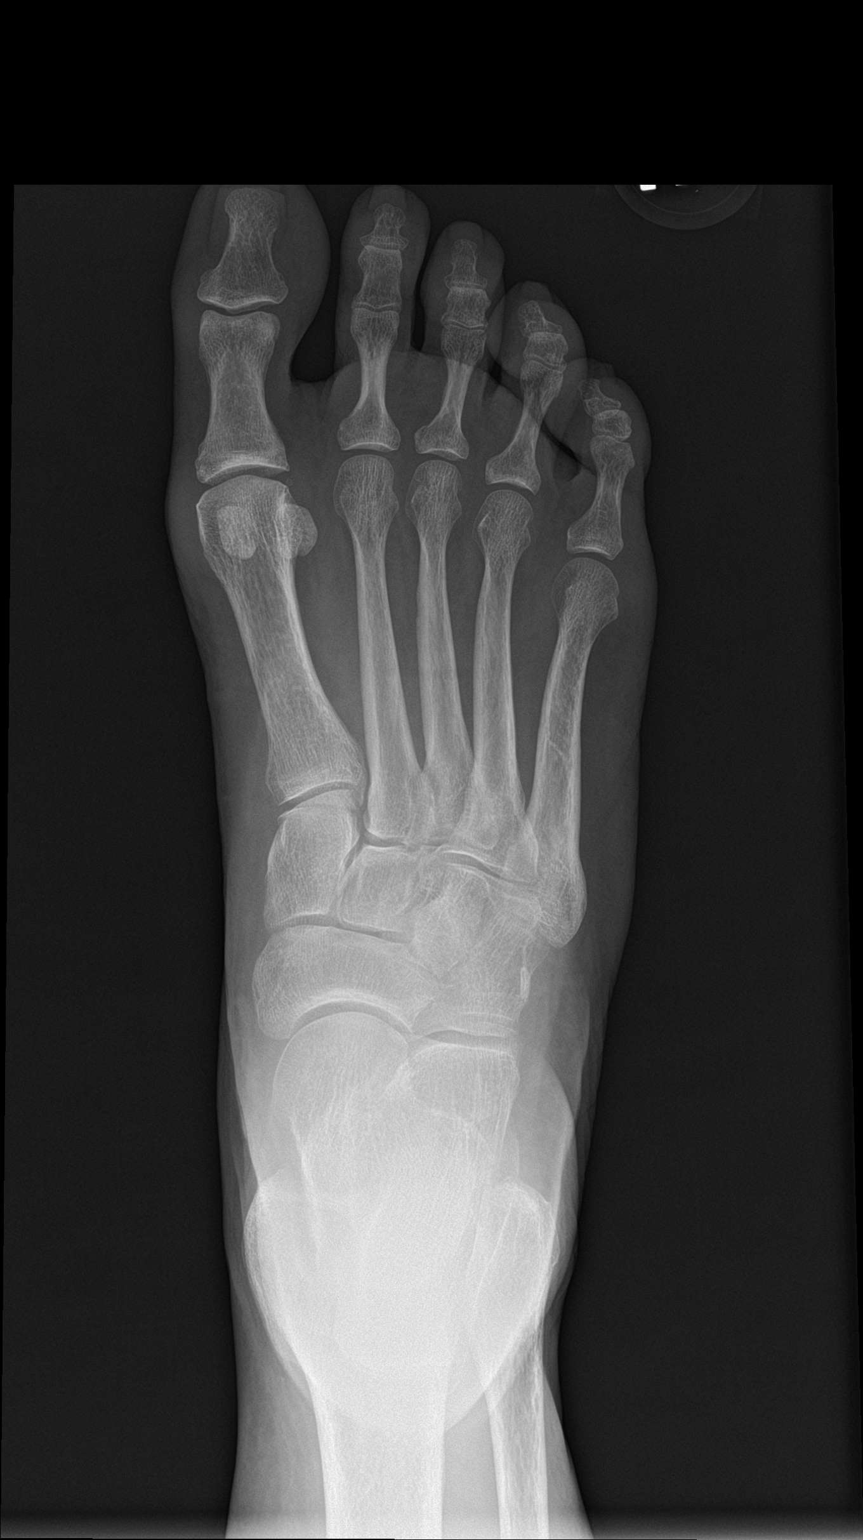

[foot obl]
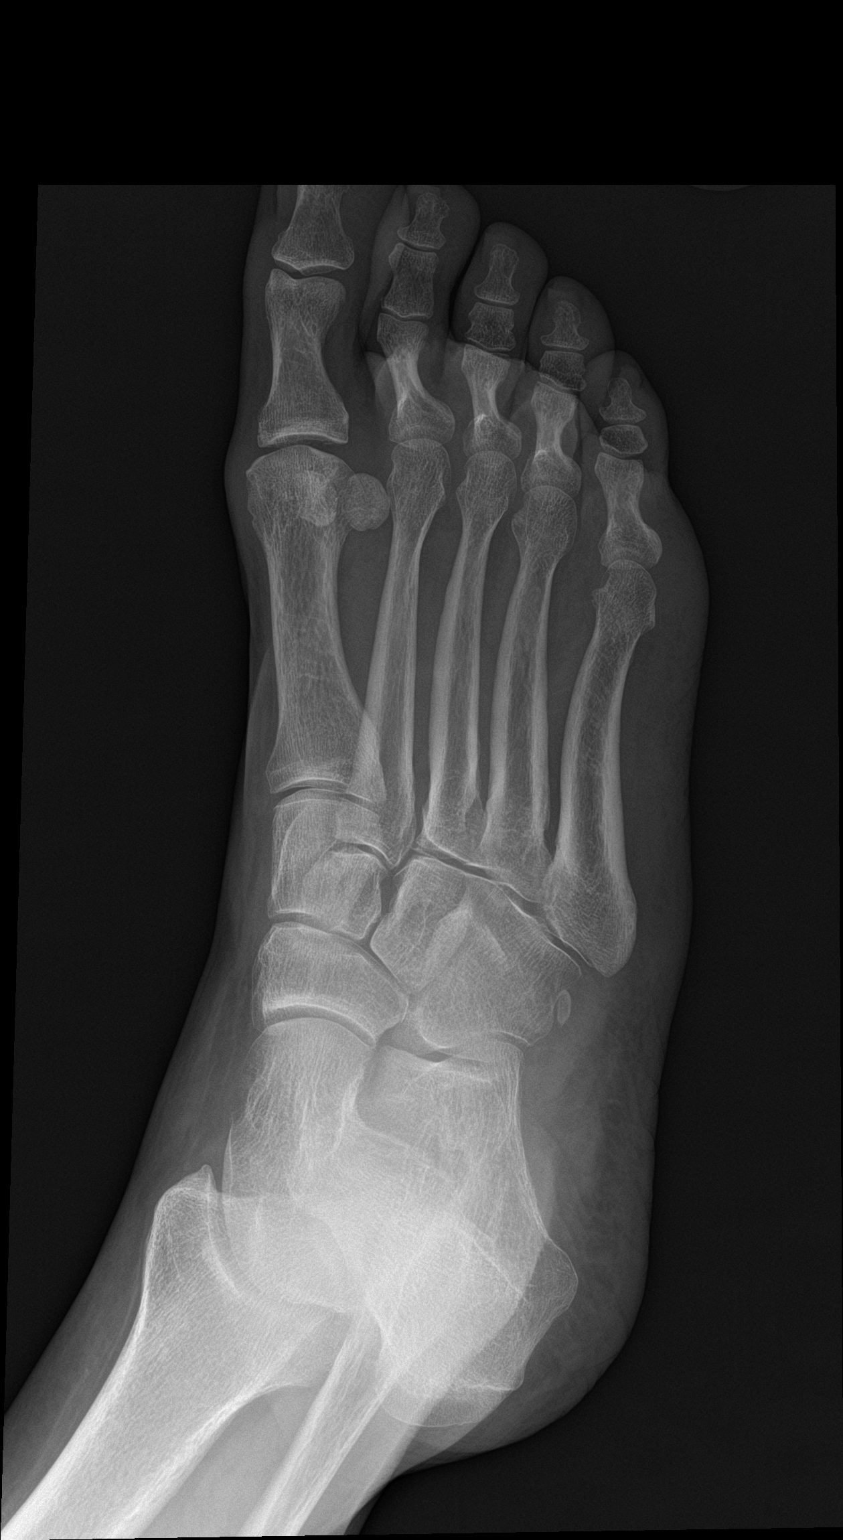

[foot lat]
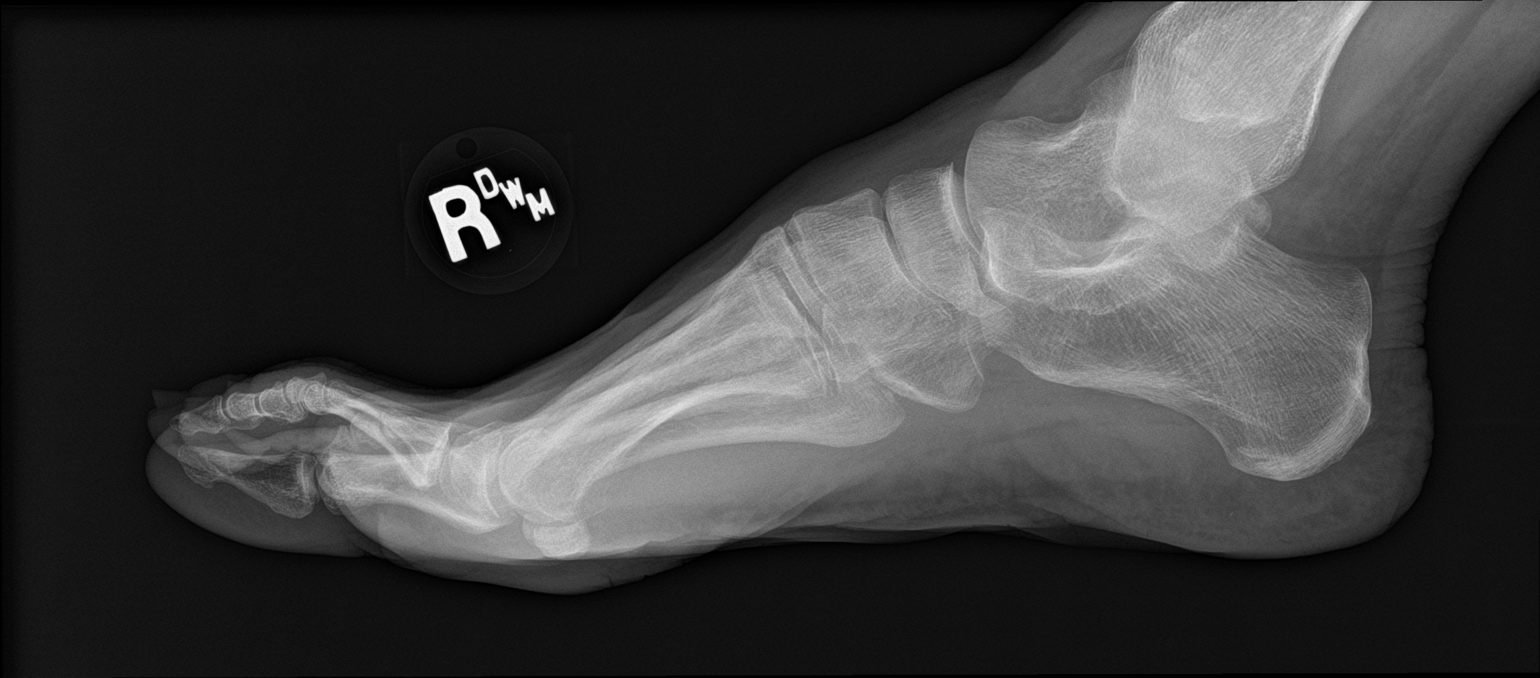

[3 of 3 positions shown; findings below may reference images not displayed]

FINDINGS: Mild soft tissue swelling in the dorsal right foot at the level of
the tarsal joints. No fracture, dislocation or suspicious focal
osseous lesion. Minimal first metatarsophalangeal joint
osteoarthritis. No radiopaque foreign bodies.
IMPRESSION: Mild dorsal right foot soft tissue swelling at the level of the
tarsal joints. No fracture or malalignment.

## 2018-04-05 ENCOUNTER — Encounter: Attending: Internal Medicine | Primary: Internal Medicine

## 2018-04-05 DIAGNOSIS — M47816 Spondylosis without myelopathy or radiculopathy, lumbar region: Secondary | ICD-10-CM | POA: Diagnosis not present

## 2018-04-08 ENCOUNTER — Other Ambulatory Visit: Payer: Self-pay | Admitting: Family Medicine

## 2018-04-08 DIAGNOSIS — Z1231 Encounter for screening mammogram for malignant neoplasm of breast: Secondary | ICD-10-CM | POA: Diagnosis not present

## 2018-04-08 DIAGNOSIS — N3010 Interstitial cystitis (chronic) without hematuria: Secondary | ICD-10-CM | POA: Diagnosis not present

## 2018-04-08 DIAGNOSIS — R351 Nocturia: Secondary | ICD-10-CM | POA: Diagnosis not present

## 2018-04-08 DIAGNOSIS — N3020 Other chronic cystitis without hematuria: Secondary | ICD-10-CM | POA: Diagnosis not present

## 2018-04-08 LAB — HM MAMMOGRAPHY

## 2018-04-13 DIAGNOSIS — K219 Gastro-esophageal reflux disease without esophagitis: Secondary | ICD-10-CM | POA: Diagnosis not present

## 2018-04-13 DIAGNOSIS — R1031 Right lower quadrant pain: Secondary | ICD-10-CM | POA: Diagnosis not present

## 2018-04-13 DIAGNOSIS — K582 Mixed irritable bowel syndrome: Secondary | ICD-10-CM | POA: Diagnosis not present

## 2018-04-15 ENCOUNTER — Other Ambulatory Visit: Payer: Medicare Other | Admitting: *Deleted

## 2018-04-15 DIAGNOSIS — E876 Hypokalemia: Secondary | ICD-10-CM | POA: Diagnosis not present

## 2018-04-15 LAB — BASIC METABOLIC PANEL
BUN/Creatinine Ratio: 9 — ABNORMAL LOW (ref 12–28)
BUN: 8 mg/dL (ref 8–27)
CO2: 24 mmol/L (ref 20–29)
Calcium: 9.9 mg/dL (ref 8.7–10.3)
Chloride: 102 mmol/L (ref 96–106)
Creatinine, Ser: 0.9 mg/dL (ref 0.57–1.00)
GFR calc Af Amer: 77 mL/min/{1.73_m2} (ref >59)
GFR calc non Af Amer: 67 mL/min/{1.73_m2} (ref >59)
Glucose: 124 mg/dL — ABNORMAL HIGH (ref 65–99)
Potassium: 4.1 mmol/L (ref 3.5–5.2)
Sodium: 139 mmol/L (ref 134–144)

## 2018-04-25 DIAGNOSIS — R112 Nausea with vomiting, unspecified: Secondary | ICD-10-CM | POA: Diagnosis not present

## 2018-04-25 DIAGNOSIS — R1031 Right lower quadrant pain: Secondary | ICD-10-CM | POA: Diagnosis not present

## 2018-04-25 DIAGNOSIS — K5730 Diverticulosis of large intestine without perforation or abscess without bleeding: Secondary | ICD-10-CM | POA: Diagnosis not present

## 2018-04-25 DIAGNOSIS — K5900 Constipation, unspecified: Secondary | ICD-10-CM | POA: Diagnosis not present

## 2018-04-26 DIAGNOSIS — N3941 Urge incontinence: Secondary | ICD-10-CM | POA: Diagnosis not present

## 2018-04-26 DIAGNOSIS — R350 Frequency of micturition: Secondary | ICD-10-CM | POA: Diagnosis not present

## 2018-04-27 DIAGNOSIS — M545 Low back pain: Secondary | ICD-10-CM | POA: Diagnosis not present

## 2018-04-29 ENCOUNTER — Ambulatory Visit (INDEPENDENT_AMBULATORY_CARE_PROVIDER_SITE_OTHER): Payer: Medicare Other | Admitting: Family Medicine

## 2018-04-29 VITALS — BP 126/67 | HR 69 | Wt 180.0 lb

## 2018-04-29 DIAGNOSIS — M545 Low back pain: Secondary | ICD-10-CM | POA: Diagnosis not present

## 2018-04-29 DIAGNOSIS — E538 Deficiency of other specified B group vitamins: Secondary | ICD-10-CM | POA: Diagnosis not present

## 2018-04-29 MED ORDER — CYANOCOBALAMIN 1000 MCG/ML IJ SOLN
1000.0000 ug | Freq: Once | INTRAMUSCULAR | Status: AC
  Administered 2018-04-29: 1000 ug via INTRAMUSCULAR

## 2018-04-29 NOTE — Progress Notes (Signed)
Agree with documentation as above.   Alyiah Ulloa, MD  

## 2018-04-29 NOTE — Progress Notes (Signed)
   Subjective:    Patient ID: Mia [name], female    DOB: 01/27/1951, 67 y.o.   MRN: [id number]  HPI Patient here today for Vitamin b12 injection. Patient has no complaints of lethargy, palpitations or problems with medication. KG LPN Patient tolerated injection well without any complications. KG LPN   Review of Systems     Objective:   Physical Exam        Assessment & Plan:  Advised patient to schedule appointment in 1 month for next Vitamin B12 injections. KGLPN

## 2018-05-02 DIAGNOSIS — M47816 Spondylosis without myelopathy or radiculopathy, lumbar region: Secondary | ICD-10-CM | POA: Diagnosis not present

## 2018-05-06 DIAGNOSIS — M545 Low back pain: Secondary | ICD-10-CM | POA: Diagnosis not present

## 2018-05-10 ENCOUNTER — Encounter: Payer: Self-pay | Admitting: Family Medicine

## 2018-05-10 ENCOUNTER — Ambulatory Visit (INDEPENDENT_AMBULATORY_CARE_PROVIDER_SITE_OTHER): Payer: Medicare Other | Admitting: Family Medicine

## 2018-05-10 ENCOUNTER — Telehealth: Payer: Self-pay | Admitting: Family Medicine

## 2018-05-10 VITALS — BP 120/65 | HR 65 | Ht 66.14 in | Wt 177.0 lb

## 2018-05-10 DIAGNOSIS — M797 Fibromyalgia: Secondary | ICD-10-CM | POA: Diagnosis not present

## 2018-05-10 DIAGNOSIS — E876 Hypokalemia: Secondary | ICD-10-CM | POA: Diagnosis not present

## 2018-05-10 DIAGNOSIS — E538 Deficiency of other specified B group vitamins: Secondary | ICD-10-CM | POA: Diagnosis not present

## 2018-05-10 DIAGNOSIS — M47816 Spondylosis without myelopathy or radiculopathy, lumbar region: Secondary | ICD-10-CM | POA: Diagnosis not present

## 2018-05-10 DIAGNOSIS — R7301 Impaired fasting glucose: Secondary | ICD-10-CM | POA: Diagnosis not present

## 2018-05-10 LAB — POCT GLYCOSYLATED HEMOGLOBIN (HGB A1C): Hemoglobin A1C: 6.5 % — AB (ref 4.0–5.6)

## 2018-05-10 MED ORDER — METFORMIN HCL ER 500 MG PO TB24: 1000.0000 mg | ORAL_TABLET | Freq: Every day | ORAL | 1 refills | Status: DC

## 2018-05-10 MED ORDER — AMBULATORY NON FORMULARY MEDICATION: 0 refills | Status: DC

## 2018-05-10 NOTE — Progress Notes (Signed)
Subjective:    CC:   HPI:  Follow-up fibromyalgia-he is actually doing okay overall.  She has not had any major flares they do she did report a week or so ago she was out in the heat sitting underneath a tent and drinking plenty of water and afterwards got very weak and dizzy.  She has been doing physical therapy for her vertigo initially and then to help with balance and gait.  More recently she is continued physical therapy for her low back.  She recently received facet injections and they are actually considering doing a nerve ablation as well.  B12 def -she is actually been feeling a little better since being on the B12 injections.  She is noticed that a couple of the little petechiae that she is to get on her arms have actually resolved and she is very happy about that.  Impaired fasting glucose-no increased thirst or urination. No symptoms consistent with hypoglycemia.  She was recently started on potassium tabs by her cardiologist for hypokalemia.   Past medical history, Surgical history, Family history not pertinant except as noted below, Social history, Allergies, and medications have been entered into the medical record, reviewed, and corrections made.   Review of Systems: No fevers, chills, night sweats, weight loss, chest pain, or shortness of breath.   Objective:    General: Well Developed, well nourished, and in no acute distress.  Neuro: Alert and oriented x3, extra-ocular muscles intact, sensation grossly intact.  HEENT: Normocephalic, atraumatic  Skin: Warm and dry, no rashes. Cardiac: Regular rate and rhythm, no murmurs rubs or gallops, no lower extremity edema.  Respiratory: Clear to auscultation bilaterally. Not using accessory muscles, speaking in full sentences.   Impression and Recommendations:    Fibromyalgia-stable.  Continue with current regimen.  She is currently on Effexor and gabapentin.  b12 def -continue with supplementation.  Due to recheck levels  soon.  IFG -unfortunately her hemoglobin L and A1c was elevated at 6.5 today.  Increase metformin to 1000 mg daily.  Prescription sent to mail order.  Hypokalemia-recently started on potassium supplementation.  Facet arthritis of the lumbar spine-has had facet injections, currently undergoing physical therapy, and considering ablation for future treatment

## 2018-05-10 NOTE — Telephone Encounter (Signed)
Call patient and let her know that her hemoglobin A1c did jump up slightly to 6.5 from her previous of 6.0 back in April.  Continue to work on healthy choices with her diet particularly low-carb and low sugar.  In addition, I would recommend that we increase her metformin to 2 tabs daily.  I will send a new prescription to her mail order.  She did mention to the nurse about her tremor.  If she would like to increase her primidone I can send over a new prescription and we can always bump her up to 100 mg if she would like.

## 2018-05-10 NOTE — Telephone Encounter (Signed)
Left VM for Pt to return clinic call regarding results, callback information provided. 

## 2018-05-11 NOTE — Telephone Encounter (Signed)
Spoke w/pt and advised her of recommendations she is ok with increasing metformin. She would like to wait on increasing the primidone for now. Mia [name], Mia [name], CMA

## 2018-05-17 DIAGNOSIS — N3941 Urge incontinence: Secondary | ICD-10-CM | POA: Diagnosis not present

## 2018-05-17 DIAGNOSIS — R350 Frequency of micturition: Secondary | ICD-10-CM | POA: Diagnosis not present

## 2018-05-19 ENCOUNTER — Inpatient Hospital Stay: Admit: 2018-05-19 | Payer: MEDICARE | Primary: Internal Medicine

## 2018-05-19 ENCOUNTER — Encounter: Admit: 2018-05-19 | Discharge: 2018-05-19 | Payer: MEDICARE | Primary: Internal Medicine

## 2018-05-19 DIAGNOSIS — M47816 Spondylosis without myelopathy or radiculopathy, lumbar region: Secondary | ICD-10-CM | POA: Diagnosis not present

## 2018-05-19 DIAGNOSIS — E119 Type 2 diabetes mellitus without complications: Secondary | ICD-10-CM

## 2018-05-19 NOTE — Progress Notes (Signed)
Prior auth approval for lansoprazole. Approval dates 01/22/18-02/21/19. Approval faxed to [facility].

## 2018-05-19 NOTE — Progress Notes (Signed)
Prior auth approval for lansoprazole. Approval dates 01/22/18-02/21/19. Approval faxed to [facility].

## 2018-05-20 ENCOUNTER — Other Ambulatory Visit: Payer: Self-pay | Admitting: Rheumatology

## 2018-05-20 LAB — METABOLIC PANEL, COMPREHENSIVE
A-G Ratio: 1.3 (ref 0.8–1.7)
ALT (SGPT): 25 U/L (ref 13–56)
AST (SGOT): 19 U/L (ref 10–38)
Albumin: 3.8 g/dL (ref 3.4–5.0)
Alk. phosphatase: 80 U/L (ref 45–117)
Anion gap: 4 mmol/L (ref 3.0–18)
BUN/Creatinine ratio: 18 (ref 12–20)
BUN: 12 MG/DL (ref 7.0–18)
Bilirubin, total: 0.3 MG/DL (ref 0.2–1.0)
CO2: 30 mmol/L (ref 21–32)
Calcium: 8.9 MG/DL (ref 8.5–10.1)
Chloride: 107 mmol/L (ref 100–111)
Creatinine: 0.67 MG/DL (ref 0.6–1.3)
GFR est AA: >60 mL/min/{1.73_m2} (ref >60)
GFR est non-AA: >60 mL/min/{1.73_m2} (ref >60)
Globulin: 3 g/dL (ref 2.0–4.0)
Glucose: 106 mg/dL — ABNORMAL HIGH (ref 74–99)
Potassium: 4.3 mmol/L (ref 3.5–5.5)
Protein, total: 6.8 g/dL (ref 6.4–8.2)
Sodium: 141 mmol/L (ref 136–145)

## 2018-05-20 LAB — CBC W/O DIFF
HCT: 41.8 % (ref 35.0–45.0)
HGB: 13 g/dL (ref 12.0–16.0)
MCH: 29.9 PG (ref 24.0–34.0)
MCHC: 31.1 g/dL (ref 31.0–37.0)
MCV: 96.1 FL (ref 74.0–97.0)
MPV: 10.2 FL (ref 9.2–11.8)
PLATELET: 183 10*3/uL (ref 135–420)
RBC: 4.35 M/uL (ref 4.20–5.30)
RDW: 13.4 % (ref 11.6–14.5)
WBC: 5.8 10*3/uL (ref 4.6–13.2)

## 2018-05-20 LAB — HEMOGLOBIN A1C W/O EAG
Hemoglobin A1C: 6 % — ABNORMAL HIGH (ref 4.2–5.6)
Hemoglobin A1c: 6 % — ABNORMAL HIGH (ref 4.2–5.6)

## 2018-05-20 LAB — COMPREHENSIVE METABOLIC PANEL
ALT: 25 U/L (ref 13–56)
AST: 19 U/L (ref 10–38)
Albumin/Globulin Ratio: 1.3 (ref 0.8–1.7)
Albumin: 3.8 g/dL (ref 3.4–5.0)
Alkaline Phosphatase: 80 U/L (ref 45–117)
Anion Gap: 4 mmol/L (ref 3.0–18)
BUN: 12 MG/DL (ref 7.0–18)
Bun/Cre Ratio: 18 (ref 12–20)
CO2: 30 mmol/L (ref 21–32)
Calcium: 8.9 MG/DL (ref 8.5–10.1)
Chloride: 107 mmol/L (ref 100–111)
Creatinine: 0.67 MG/DL (ref 0.6–1.3)
EGFR IF NonAfrican American: >60 mL/min/{1.73_m2} (ref >60)
GFR African American: >60 mL/min/{1.73_m2} (ref >60)
Globulin: 3 g/dL (ref 2.0–4.0)
Glucose: 106 mg/dL — ABNORMAL HIGH (ref 74–99)
Potassium: 4.3 mmol/L (ref 3.5–5.5)
Sodium: 141 mmol/L (ref 136–145)
Total Bilirubin: 0.3 MG/DL (ref 0.2–1.0)
Total Protein: 6.8 g/dL (ref 6.4–8.2)

## 2018-05-20 LAB — CBC
Hematocrit: 41.8 % (ref 35.0–45.0)
Hemoglobin: 13 g/dL (ref 12.0–16.0)
MCH: 29.9 PG (ref 24.0–34.0)
MCHC: 31.1 g/dL (ref 31.0–37.0)
MCV: 96.1 FL (ref 74.0–97.0)
MPV: 10.2 FL (ref 9.2–11.8)
Platelets: 183 10*3/uL (ref 135–420)
RBC: 4.35 M/uL (ref 4.20–5.30)
RDW: 13.4 % (ref 11.6–14.5)
WBC: 5.8 10*3/uL (ref 4.6–13.2)

## 2018-05-20 NOTE — Telephone Encounter (Signed)
Last Visit: 01/06/18 Next Visit: 06/09/18 Labs: 03/11/18 ALT mildly elevated. She should avoid all NSAIDS.  Okay to refill per Dr. [name]

## 2018-05-26 ENCOUNTER — Ambulatory Visit: Admit: 2018-05-26 | Discharge: 2018-05-26 | Payer: MEDICARE | Attending: Internal Medicine | Primary: Internal Medicine

## 2018-05-26 ENCOUNTER — Ambulatory Visit: Attending: Internal Medicine | Primary: Internal Medicine

## 2018-05-26 DIAGNOSIS — E119 Type 2 diabetes mellitus without complications: Secondary | ICD-10-CM

## 2018-05-26 NOTE — Progress Notes (Signed)
Office Visit Note  Patient: Mia [name]             Date of Birth: 02/23/1951           MRN: [id number]             PCP: Hali Marry, MD Referring: Hali Marry, * Visit Date: 06/09/2018 Occupation: @GUAROCC @  Subjective:  Lower back pain   History of Present Illness: Mia [name] is a 67 y.o. female with history of seronegative rheumatoid arthritis, osteoarthritis, DDD, and fibromyalgia. She is on Arava 20 mg po daily.  She has noticed since being on [place] consistently.  She denies any side effects.  She reports she has not had any rheumatoid arthritis flares recently. She continues to follow up with Dr. Grandville Silos for management of carpal tunnel.  She reports the left carpal tunnel release went well and she is asymptomatic at this time. She has some residual swelling, which she reports Dr. Grandville Silos said the swelling shoulder resolve within 1 year.  She denies any wrist pain at this time.  She has been experiencing symptoms of carpal tunnel in the right wrist recently, and she is planning to have surgery in a few months.  She states she has been having worsening back pain and has been following up with Dr. Mina Marble for facet joint injections.  She is planning on having a nerve ablation within the next few weeks. She reports the right knee replacement is doing well.  She has been having increased left knee pain, but she does not want to move forward with a knee replacement at this time.  She denies any joint swelling. She has been going to PT to work on balance.   She reports she has been having worsening mouth dryness and eye dryness.  She has tried biotene products without much relief.   Activities of Daily Living:  Patient reports morning stiffness for 30 minutes.   Patient Reports nocturnal pain.  Difficulty dressing/grooming: Denies Difficulty climbing stairs: Reports Difficulty getting out of chair: Reports Difficulty using hands for taps, buttons, cutlery, and/or writing:  Reports  Review of Systems  Constitutional: Positive for fatigue.  HENT: Positive for mouth dryness. Negative for mouth sores and nose dryness.   Eyes: Positive for dryness. Negative for pain and visual disturbance.  Respiratory: Negative for cough, hemoptysis, shortness of breath and difficulty breathing.   Cardiovascular: Positive for swelling in legs/feet. Negative for chest pain, palpitations and hypertension.  Gastrointestinal: Negative for blood in stool, constipation and diarrhea.  Endocrine: Negative for increased urination.  Genitourinary: Negative for painful urination.  Musculoskeletal: Positive for arthralgias, joint pain, joint swelling, muscle weakness, morning stiffness and muscle tenderness. Negative for myalgias and myalgias.  Skin: Positive for rash. Negative for color change, pallor, hair loss, nodules/bumps, skin tightness, ulcers and sensitivity to sunlight.  Allergic/Immunologic: Negative for susceptible to infections.  Neurological: Negative for dizziness, headaches and weakness.  Hematological: Negative for swollen glands.  Psychiatric/Behavioral: Negative for depressed mood and sleep disturbance. The patient is not nervous/anxious.     PMFS History:  Patient Active Problem List   Diagnosis Date Noted  . Facet arthritis of lumbar region 05/10/2018  . Hypokalemia 05/10/2018  . MCI (mild cognitive impairment) 11/25/2017  . Restrictive lung disease 07/30/2017  . Mixed hyperlipidemia 06/18/2017  . Osteoporosis 04/18/2017  . Trochanteric bursitis of both hips 02/24/2017  . Stress fracture of left tibia 11/05/2016  . Inflammatory arthritis 09/30/2016  . High risk medication use  09/30/2016  . History of Clostridium difficile colitis 09/30/2016  . 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 Pseudogout 09/30/2016  . DDD cervical spine status post fusion 09/30/2016  . DDD thoracic spine 09/30/2016  . H/O total knee replacement, right 09/30/2016  . Age-related  osteoporosis without current pathological fracture 09/30/2016  . Tremor, essential 05/14/2016  . Right foot pain 04/14/2016  . B12 deficiency 09/10/2015  . Syncope 09/10/2015  . Cervical facet joint syndrome 09/10/2015  . Chronic fatigue 09/10/2015  . Aortic atherosclerosis ([place]) 04/01/2015  . Primary osteoarthritis of left knee 02/27/2015  . DOE (dyspnea on exertion)   . Benign head tremor 06/11/2014  . Lumbar degenerative disc disease 06/11/2014  . IFG (impaired fasting glucose) 03/09/2014  . Osteopenia 03/09/2014  . Hemorrhoid 03/09/2014  . Retinal wrinkling, right eye 03/09/2014  . Fibromyalgia 03/09/2014  . GERD (gastroesophageal reflux disease) 03/09/2014  . History of arthroplasty of right knee 01/31/2014  . Memory difficulty 12/14/2013  . Hemorrhoids, external, thrombosed 06/22/2011  . Hypertension 03/11/2011  . SVT (supraventricular tachycardia) ([place])   . Raynaud phenomenon   . EDEMA 08/25/2010  . Nonspecific (abnormal) findings on radiological and other examination of body structure 08/25/2010  . COMPUTERIZED TOMOGRAPHY, CHEST, ABNORMAL 08/25/2010  . OSA (obstructive sleep apnea) 04/24/2009  . Allergic rhinitis 06/11/2008  . Dyspnea 06/11/2008  . 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 Chronic diastolic CHF (congestive heart failure) ([place]) 06/08/2008  . INTERSTITIAL CYSTITIS 06/08/2008    Past Medical History:  Diagnosis Date  . Anal fissure   . Anemia   . Arthritis   . Blood transfusion   . C. difficile colitis   . Chest pain    a. 01/2013 MV: EF 59%, no ischemia.  . Chronic Dyspnea    a. 01/2013 Echo: EF 60-65%, Gr 1 DD, PASP 28mmHg.  . Diabetes mellitus   . Fibromyalgia   . Gall stones   . GERD (gastroesophageal reflux disease)    gastritis  . Hemorrhoids   . Hypertension   . Interstitial cystitis   . MCI (mild cognitive impairment) 11/25/2017  . Memory difficulty 12/14/2013  . Neuromuscular disorder ([place])    sclerosis  . Osteoporosis     . Peptic ulcer   . PONV (postoperative nausea and vomiting)   . Pseudogout   . Raynaud phenomenon   . Rectal bleeding   . Sleep apnea    a. on cpap.  Marland Kitchen SVT (supraventricular tachycardia) ([place])   . Syncope 09/10/2015  . Thyroid disease    hypothyroidism  . Tremor, essential 05/14/2016    Family History  Problem Relation Age of Onset  . Heart attack Mother   . Stroke Mother   . Diabetes Mother   . Heart disease Mother   . Colon polyps Mother   . Asthma Mother   . Breast cancer Maternal Grandmother   . Wilson's disease Maternal Grandmother   . Pancreatic cancer Maternal Grandfather   . Heart disease Father   . Heart attack Father   . Asthma Father   . Stroke Sister   . Hypertension Sister   . Rheum arthritis Sister   . Dementia Sister   . Asthma Sister   . Lupus Sister   . Asthma Sister    Past Surgical History:  Procedure Laterality Date  . APPENDECTOMY    . BLADDER SURGERY     x2  . BLADDER SURGERY    . BREAST BIOPSY    . CARDIAC CATHETERIZATION    . CARPAL  TUNNEL RELEASE    . CATARACT EXTRACTION Bilateral   . CHOLECYSTECTOMY    . DILATION AND CURETTAGE OF UTERUS    . ENTEROCELE REPAIR     x2  . EYE SURGERY     retina  . hysterectomy - unknown type    . JOINT REPLACEMENT    . KNEE ARTHROPLASTY    . KNEE SURGERY     x6  . NECK SURGERY     fusion  . OOPHORECTOMY    . QUADRICEPS REPAIR Right   . RECTOCELE REPAIR     x2  . SHOULDER ARTHROSCOPY WITH SUBACROMIAL DECOMPRESSION, ROTATOR CUFF REPAIR AND BICEP TENDON REPAIR  10/06/2012   Procedure: SHOULDER ARTHROSCOPY WITH SUBACROMIAL DECOMPRESSION, ROTATOR CUFF REPAIR AND BICEP TENDON REPAIR;  Surgeon: Nita Sells, MD;  Location: [facility];  Service: Orthopedics;  Laterality: Right;  Arthroscopic  Repair  of  Subscapularis, Open Biceps Tenodesis  . SHOULDER SURGERY     bilateral- bones spur  . TONSILLECTOMY    . TOTAL KNEE ARTHROPLASTY    . TOTAL SHOULDER ARTHROPLASTY      Social History   Social History Narrative   Patient drinks about 3-4 cups of caffeine daily.   Patient is right handed.    Objective: Vital Signs: BP 106/75 (BP Location: Left Arm, Patient Position: Sitting, Cuff Size: Normal)   Pulse 72   Resp 16   Ht 5\' 6"  (1.676 m)   Wt 175 lb 12.8 oz (79.7 kg)   BMI 28.37 kg/m    Physical Exam  Constitutional: She is oriented to person, place, and time. She appears well-developed and well-nourished.  HENT:  Head: Normocephalic and atraumatic.  Eyes: Conjunctivae and EOM are normal.  Neck: Normal range of motion.  Cardiovascular: Normal rate, regular rhythm, normal heart sounds and intact distal pulses.  Pulmonary/Chest: Effort normal and breath sounds normal.  Abdominal: Soft. Bowel sounds are normal.  Lymphadenopathy:    She has no cervical adenopathy.  Neurological: She is alert and oriented to person, place, and time.  Skin: Skin is warm and dry. Capillary refill takes less than 2 seconds.  Psychiatric: She has a normal mood and affect. Her behavior is normal.  Nursing note and vitals reviewed.    Musculoskeletal Exam: C-spine limited ROM.  Thoracic and lumbar spine limited ROM.  Midline spinal tenderness in the lumbar region.  Left SI joint tenderness.  Shoulder joints, elbow joints, wrist joints, MCPs, PIPs, and DIPs good ROM with no synovitis or tenderness.  Left hip full ROM with discomfort.  Right hip full ROM with no discomfort.  Right knee replacement no warmth or effusion.  Left knee limited extension but no warmth or effusion.  No tenderness of trochanteric bursa bilaterally.   CDAI Exam: CDAI Homunculus Exam:   Joint Counts:  CDAI Tender Joint count: 0 CDAI Swollen Joint count: 0  Global Assessments:  Patient Global Assessment: 3 Provider Global Assessment: 3  CDAI Calculated Score: 6   Investigation: No additional findings.  Imaging: No results found.  Recent Labs: Lab Results  Component Value Date   WBC  6.6 03/11/2018   HGB 12.3 03/11/2018   PLT 311 03/11/2018   NA 139 04/15/2018   K 4.1 04/15/2018   CL 102 04/15/2018   CO2 24 04/15/2018   GLUCOSE 124 (H) 04/15/2018   BUN 8 04/15/2018   CREATININE 0.90 04/15/2018   BILITOT 0.3 03/11/2018   ALKPHOS 85 06/04/2017   AST 30 03/11/2018  ALT 31 (H) 03/11/2018   PROT 6.2 03/11/2018   ALBUMIN 4.3 06/04/2017   CALCIUM 9.9 04/15/2018   GFRAA 77 04/15/2018    Speciality Comments: No specialty comments available.  Procedures:  No procedures performed Allergies: Codeine; Myrbetriq  [mirabegron]; Percocet [oxycodone-acetaminophen]; Celebrex [celecoxib]; Darvocet [propoxyphene n-acetaminophen]; Erythromycin; Hydrocodone; Lyrica [pregabalin]; Macrodantin [nitrofurantoin]; Toradol [ketorolac tromethamine]; Tramadol; and Verapamil   Assessment / Plan:     Visit Diagnoses: Seronegative rheumatoid arthritis - She has no synovitis on exam.  She has no joint pain or joint swelling at this time.  She has not had any recent rheumatoid arthritis flares.  She has noticed significant benefit since being on Arava 20 mg po daily consistently. She is tolerating it well.  A refill was sent to the pharmacy. She was advised to notify us if she develops increased joint pain or joint swelling.  She will follow up in the office in 5 months.   High risk medication use - Arava 20 mg po daily -CBC and CMP were drawn today. She will return in November and every 3 months to monitor for drug toxicity. Plan: CBC with Differential/Platelet, COMPLETE METABOLIC PANEL WITH GFR  Primary osteoarthritis of left knee: Limited extension.  No warmth or effusion.  She does not want to proceed with a left knee replacement at this time.   H/O total knee replacement, right: Doing well.  No warmth or effusion.  Good ROM on exam.   Pseudogout: She has not had any flares recently.    DDD (degenerative disc disease), cervical - s/p fusion: She has limited ROM on exam.  She has no neck  pain or radiculopathy at this time.   DDD (degenerative disc disease), thoracic: Chronic pain.    DDD (degenerative disc disease), lumbar: Chronic pain.  She is seeing Dr. Mina Marble for facet joint injections and she is planning on having a nerve ablation within the next few weeks.   Fibromyalgia: Her fibromyalgia has been well controlled.   S/P carpal tunnel release - L, performed by Dr. Grandville Silos. Doing well.  She is symptomatic at this time.  She has been experiencing symptoms of carpal tunnel of the right wrist, and she plans on having surgery in the next few months.   Raynaud's phenomenon without gangrene: She has mild symptoms of Raynaud's.  No digital ulcerations or signs of gangrene.    Dry mouth - She has been having severe mouth dryness.  She has no parotid swelling on exam.  We will check Ro and La today.  I recommended the use of OTC biotene products.  Plan: Sjogrens syndrome-B extractable nuclear antibody, Sjogrens syndrome-A extractable nuclear antibody   Other osteoporosis without current pathological fracture: She receives Prolia injections every 6 months.   Other medical conditions are listed as follows:   IC (interstitial cystitis)  History of Clostridium difficile colitis  Chronic fatigue  History of gastroesophageal reflux (GERD)  History of CHF (congestive heart failure)  History of hypertension   Orders: Orders Placed This Encounter  Procedures  . CBC with Differential/Platelet  . COMPLETE METABOLIC PANEL WITH GFR  . Sjogrens syndrome-B extractable nuclear antibody  . Sjogrens syndrome-A extractable nuclear antibody   Meds ordered this encounter  Medications  . leflunomide (ARAVA) 20 MG tablet    Sig: Take 1 tablet (20 mg total) by mouth daily.    Dispense:  90 tablet    Refill:  0    Face-to-face time spent with patient was 30 minutes. Greater than 50% of  time was spent in counseling and coordination of care.  Follow-Up Instructions: Return in about 5  months (around 11/09/2018) for Rheumatoid arthritis, Osteoarthritis, DDD, Fibromyalgia.   [name], PA-C  Note - This record has been created using Dragon software.  Chart creation errors have been sought, but may not always  have been located. Such creation errors do not reflect on  the standard of medical care.

## 2018-05-26 NOTE — ACP (Advance Care Planning) (Signed)
Advance Directive:  1. Do you have an advance directive in place? Patient Reply:YES

## 2018-05-26 NOTE — Progress Notes (Signed)
67 y.o. white female who presents for f/u    Denied any cardiovascular complaints.  She's trying to be active although no set exercise    No gi or gu issues    She changed over to [place] after the last visit and her numbers are definitely down. Denies polyuria, polydipsia, nocturia, vision change.  Doing better with her diet and weight is down fractionally    Vitals 05/26/2018 02/22/2018 11/23/2017 05/11/2017 11/10/2016   Weight 164 lb 166 lb 166 lb 168 lb 168 lb     No sx referable to the thyroid    LAST MEDICARE WELLNESS EXAM: 11/10/16, 11/23/17    Past Medical History:   Diagnosis Date   ??? Allergic rhinitis    ??? Anxiety state, unspecified    ??? Arthritis     Dr. Synetta Shadow, Dr. Marisa Hua   ??? 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 Compression fx, thoracic spine ([place]) 2007    negative DEXA Dr. Marisa Hua   ??? Diabetes ([facility]) 10/2017    on basis of fbs>125; intol metformin   ??? Dyslipidemia    ??? Dyspepsia    ??? Frozen shoulder     right Dr. Wynonia Hazard MRI   ??? Left thyroid nodule 04/2016    1cm nodule; apparently noted on CT 2008 and unchanged   ??? Multiple lung nodules     no change 10/06, 03/07, 03/08   ??? Overweight (BMI 25.0-29.9)     IF 7/18 start weight 168 lbs not doing    ??? Painless hematuria 04/2016    Dr Jimmye Norman, neg eval   ??? Palpitations 2008    neg thallium 2008, nl holter 2008, echo nl lv/ef 65%/tr mr/dd/nl pasp   ??? Prediabetes    ??? Syncope     neurocardiogenic by tilt 1994   ??? Venous insufficiency    ??? Vitamin D deficiency      Past Surgical History:   Procedure Laterality Date   ??? CARDIAC SURG PROCEDURE UNLIST      thallium 2005 neg; NST 1/17 negative ef 75%   ??? ECHO 2D ADULT  12/04    normal with EF 70%   ??? HX COLONOSCOPY      Dr Rosendo Gros 2007 neg; 04/22/18 neg   ??? HX GYN      s/p BTL    ??? HX HEMORRHOIDECTOMY      Dr. Rosendo Gros 2007   ??? HX HYSTERECTOMY  03/11/2017    Dr Sheral Flow   ??? HX ORTHOPAEDIC       DEXA -0.5 spine, -0.8 hip (1/18)   ??? HX UROLOGICAL  06/2016    Dr Jimmye Norman; bladder bx showed benign lesions   ??? [place] ABD COMP  9/05    negative   ??? VAS CAROTID DUPLEX BILATERAL  2004    negative     Social History     Socioeconomic History   ??? Marital status: MARRIED     Spouse name: Not on file   ??? Number of children: 2   ??? Years of education: Not on file   ??? Highest education level: Not on file   Occupational History   ??? Occupation: benefits specialist   Social Needs   ??? Financial resource strain: Not on file   ??? Food insecurity:     Worry: Not on file     Inability: Not  on file   ??? Transportation needs:     Medical: Not on file     Non-medical: Not on file   Tobacco Use   ??? Smoking status: Never Smoker   ??? Smokeless tobacco: Never Used   Substance and Sexual Activity   ??? Alcohol use: Yes     Alcohol/week: 4.0 standard drinks     Types: 4 Glasses of wine per week   ??? Drug use: No   ??? Sexual activity: Not on file   Lifestyle   ??? Physical activity:     Days per week: Not on file     Minutes per session: Not on file   ??? Stress: Not on file   Relationships   ??? Social connections:     Talks on phone: Not on file     Gets together: Not on file     Attends religious service: Not on file     Active member of club or organization: Not on file     Attends meetings of clubs or organizations: Not on file     Relationship status: Not on file   ??? Intimate partner violence:     Fear of current or ex partner: Not on file     Emotionally abused: Not on file     Physically abused: Not on file     Forced sexual activity: Not on file   Other Topics Concern   ??? Not on file   Social History Narrative   ??? Not on file     Current Outpatient Medications   Medication Sig   ??? SITagliptin (JANUVIA) 100 mg tablet Take 1 Tab by mouth daily.   ??? citalopram (CELEXA) 10 mg tablet take 1 tablet by mouth once daily   ??? lansoprazole (PREVACID) 30 mg capsule TAKE 1 CAPSULE BY MOUTH BEFORE BREAKFAST DAILY.    ??? Blood-Glucose Meter monitoring kit Use daily as directed   ??? glucose blood VI test strips (BLOOD GLUCOSE TEST) strip Use daily as directed   ??? lancets misc Use daily as directed   ??? ergocalciferol (VITAMIN D2) 50,000 unit capsule Take 50,000 Units by mouth every thirty (30) days.   ??? atorvastatin (LIPITOR) 40 mg tablet take 1 tablet by mouth once daily   ??? omega-3 fatty acids (FISH OIL) cap Take 2 Tabs by mouth daily.   ??? aspirin 81 mg chewable tablet Take 81 mg by mouth daily.     No current facility-administered medications for this visit.      Allergies   Allergen Reactions   ??? Fish Oil Nausea Only   ??? Metformin Diarrhea   ??? Relafen [Nabumetone] Nausea Only     REVIEW OF SYSTEMS: gyn 4/18 Dr Andrew Au, mammo 3/19, colo 6/19 Dr Rosendo Gros, DEXA 1/18 Dr Margaretann Loveless ??? no vision change or eye pain  Oral ??? no mouth pain, tongue or tooth problems  Ears ??? no hearing loss, ear pain, fullness, no swallowing problems  Cardiac ??? no CP, PND, orthopnea, edema, palpitations or syncope  Chest ??? no breast masses  Resp ??? no wheezing, chronic coughing, dyspnea  Urinary ??? no dysuria, hematuria, flank pain, urgency, frequency    Visit Vitals  BP 121/78   Pulse 84   Temp 98.7 ??F (37.1 ??C) (Oral)   Resp 14   Ht '5\' 4"'$  (1.626 m)   Wt 164 lb (74.4 kg)   SpO2 96%   BMI 28.15 kg/m??   Affect is appropriate.  Mood stable  No apparent distress  HEENT --Anicteric sclerae.  No JVD, or bruits.  Thyroid fullness on left  Lungs --Clear to auscultation and percussion, normal percussion.  Heart --Regular rate and rhythm, no murmurs, rubs, gallops, or clicks.  Abdomen -- Soft and nontender, no hepatosplenomegaly or masses.  Extremities -- Without cyanosis, clubbing, edema. 2+ pulses equally and bilaterally.    LABS  From 5/10 showed   gluc 110, cr 0.70,               alt 23,                                    chol 154, tg 143, hdl 45, ldl-c 80,                                                            tsh 1.91   From 5/10 showed                      2 hr GTT 89  From 8/10 showed                                                               vit d 23.0, ck 57, aldo 5.4  From 8/11 showed   gluc 108,                                     hba1c 6.3,                   chol 160, tg 172, hdl 39, ldl-c 87,  wbc 5.,7 hb 12.3, plt 235, ua neg,     tsh 2.28  From 5/12 showed   gluc 113, cr 0.71, gfr 94,  alt 16, hba1c 6.2, ldl-p 1989, chol 177, tg 161, hdl 43, ldl-c 102  From 11/12 showed                                                     hba1c 5.9, ldl-p 1218, chol 133, tg 116, hdl 45, ldl-c 65  From 5/13 showed   gluc 105, cr 0.55, gfr 102, alt 8,  hba1c 6.2,                   chol 148, tg 107, hdl 45, ldl-c 82,  wbc 5.0, hb 12.5, plt 172, vit d 40.3  From 11/13 showed        hba1c 6.2, ldl-p 1888, chol 176, tg 155, hdl 49, ldl-c 86,  wbc 6.2, hb 12.3, plt 182, vit d 34.4  From 5/14 showed        hba1c 6.2,     chol 152, tg 157, hdl 39, ldl-c 82  From 1/15 showed   gluc 113, cr 0.68,  gfr>60, alt 11, hba1c 6.2,     chol 146, tg 130, hdl 43, ldl-c 77  From 7/15 showed        hba1c 6.3,     chol 133, tg 124, hdl 39, ldl-c 69, wbc 6.3, hb 12.2, plt 193  From 6/16 showed   gluc 106, cr 0.74, gfr>60, alt 26, hba1c 6.3,     chol 126, tg 125, hdl 46, ldl-c 55, wbc 5.7, hb 13.2, plt 203, vit d 7.2,   tsh 1.69  From 12/16 showed gluc 110, cr 0.68, gfr>60, alt 30, hba1c 6.1,     chol 135, tg 147, hdl 47, ldl-c 59, wbc 6.0, hb 12.7, plt 197  From 6/17 showed   gluc 122, cr 0.71, gfr>60, alt 33, hba1c 6.2,     chol 153, tg 140, hdl 46, ldl-c 79, wbc 6.3, hb 13.1, plt 190,        tsh 1.92, hep c neg  From 1/18 showed        hba1c 6.2,    chol 154, tg 181, hdl 41, ldl-c 77  From 7/18 showed   gluc 129, cr 0.63, gfr>60, alt 26, hba1c 6.1,              wbc 6.0, hb 12.4, plt 193, vit d 79.0  From 1/19 showed   gluc 132, cr 0.83, gfr>60,    hba1c 6.1  From 4/19 showed        hba1c 7.0, umar 12.0, chol 124, tg 109, hdl 46, ldl-c 56,      vit d 56.1     Results for orders placed or performed during the hospital encounter of 05/19/18   CBC W/O DIFF   Result Value Ref Range    WBC 5.8 4.6 - 13.2 K/uL    RBC 4.35 4.20 - 5.30 M/uL    HGB 13.0 12.0 - 16.0 g/dL    HCT 41.8 35.0 - 45.0 %    MCV 96.1 74.0 - 97.0 FL    MCH 29.9 24.0 - 34.0 PG    MCHC 31.1 31.0 - 37.0 g/dL    RDW 13.4 11.6 - 14.5 %    PLATELET 183 135 - 420 K/uL    MPV 10.2 9.2 - 47.8 FL   METABOLIC PANEL, COMPREHENSIVE   Result Value Ref Range    Sodium 141 136 - 145 mmol/L    Potassium 4.3 3.5 - 5.5 mmol/L    Chloride 107 100 - 111 mmol/L    CO2 30 21 - 32 mmol/L    Anion gap 4 3.0 - 18 mmol/L    Glucose 106 (H) 74 - 99 mg/dL    BUN 12 7.0 - 18 MG/DL    Creatinine 0.67 0.6 - 1.3 MG/DL    BUN/Creatinine ratio 18 12 - 20      GFR est AA >60 >60 ml/min/1.30m    GFR est non-AA >60 >60 ml/min/1.780m   Calcium 8.9 8.5 - 10.1 MG/DL    Bilirubin, total 0.3 0.2 - 1.0 MG/DL    ALT (SGPT) 25 13 - 56 U/L    AST (SGOT) 19 10 - 38 U/L    Alk. phosphatase 80 45 - 117 U/L    Protein, total 6.8 6.4 - 8.2 g/dL    Albumin 3.8 3.4 - 5.0 g/dL    Globulin 3.0 2.0 - 4.0 g/dL    A-G Ratio 1.3 0.8 - 1.7     HEMOGLOBIN A1C W/O EAG   Result  Value Ref Range    Hemoglobin A1c 6.0 (H) 4.2 - 5.6 %     Patient Active Problem List   Diagnosis Code   ??? Vitamin D deficiency E55.9   ??? Anxiety F41.9   ??? Dyslipidemia E78.5   ??? 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 Overweight (BMI 25.0-29.9) E66.3   ??? Controlled type 2 diabetes mellitus, without long-term current use of insulin (HCC) E11.9     Assessment and plan:  1. DM.  Continue current regimen.  F/u ophth  2. Hyperlipidemia. Continue current  3. Hypovitaminosis D. Continue current regimen.  4. Anxiety.  Continue celexa  5. Overweight.  Lifestyle and dietary measures.  Portion control reiterated.  6. Endometrial ca.  Per Dr Sheral Flow        RTC 2/20    Above conditions discussed at length and patient vocalized understanding.  All questions answered to patient satisfaction

## 2018-05-26 NOTE — Telephone Encounter (Signed)
At check out patient said she was supposed to get nutritional information and information on classes.    Wants to know if it can get mailed to her.

## 2018-05-26 NOTE — Progress Notes (Signed)
Laura Roman presents today for   Chief Complaint   Patient presents with   ??? Diabetes     3 month follow up with labs              Depression Screening:  3 most recent PHQ Screens 02/22/2018   Little interest or pleasure in doing things Not at all   Feeling down, depressed, irritable, or hopeless Not at all   Total Score PHQ 2 0       Learning Assessment:  Learning Assessment 07/17/2015   PRIMARY LEARNER Patient   HIGHEST LEVEL OF EDUCATION - PRIMARY LEARNER  GRADUATED HIGH SCHOOL OR GED   BARRIERS PRIMARY LEARNER -   CO-LEARNER CAREGIVER -   PRIMARY LANGUAGE ENGLISH   LEARNER PREFERENCE PRIMARY READING     LISTENING     DEMONSTRATION   ANSWERED BY patient Laura Roman   RELATIONSHIP SELF       Abuse Screening:  Abuse Screening Questionnaire 11/23/2017   Do you ever feel afraid of your partner? N   Are you in a relationship with someone who physically or mentally threatens you? N   Is it safe for you to go home? Y       Fall Risk  Fall Risk Assessment, last 12 mths 02/22/2018   Able to walk? Yes   Fall in past 12 months? No           Coordination of Care:  1. Have you been to the ER, urgent care clinic since your last visit?   Hospitalized since your last visit? no    2. Have you seen or consulted any other health care providers outside of the [place] since your last visit?Include any pap smears or colon screening. no

## 2018-05-26 NOTE — Progress Notes (Signed)
 Laura Roman presents today for   Chief Complaint   Patient presents with   . Diabetes     3 month follow up with labs              Depression Screening:  3 most recent PHQ Screens 02/22/2018   Little interest or pleasure in doing things Not at all   Feeling down, depressed, irritable, or hopeless Not at all   Total Score PHQ 2 0       Learning Assessment:  Learning Assessment 07/17/2015   PRIMARY LEARNER Patient   HIGHEST LEVEL OF EDUCATION - PRIMARY LEARNER  GRADUATED HIGH SCHOOL OR GED   BARRIERS PRIMARY LEARNER -   CO-LEARNER CAREGIVER -   PRIMARY LANGUAGE ENGLISH   LEARNER PREFERENCE PRIMARY READING     LISTENING     DEMONSTRATION   ANSWERED BY patient [name]   RELATIONSHIP SELF       Abuse Screening:  Abuse Screening Questionnaire 11/23/2017   Do you ever feel afraid of your partner? N   Are you in a relationship with someone who physically or mentally threatens you? N   Is it safe for you to go home? Y       Fall Risk  Fall Risk Assessment, last 12 mths 02/22/2018   Able to walk? Yes   Fall in past 12 months? No           Coordination of Care:  1. Have you been to the ER, urgent care clinic since your last visit?   Hospitalized since your last visit? no    2. Have you seen or consulted any other health care providers outside of the [facility] since your last visit?Include any pap smears or colon screening. no

## 2018-05-26 NOTE — Progress Notes (Signed)
67 y.o. white female who presents for f/u    Denied any cardiovascular complaints.  She's trying to be active although no set exercise    No gi or gu issues    She changed over to [place] after the last visit and her numbers are definitely down. Denies polyuria, polydipsia, nocturia, vision change.  Doing better with her diet and weight is down fractionally    Vitals 05/26/2018 02/22/2018 11/23/2017 05/11/2017 11/10/2016   Weight 164 lb 166 lb 166 lb 168 lb 168 lb     No sx referable to the thyroid    LAST MEDICARE WELLNESS EXAM: 11/10/16, 11/23/17    Past Medical History:   Diagnosis Date   ??? Allergic rhinitis    ??? Anxiety state, unspecified    ??? Arthritis     Dr. Synetta Shadow, Dr. Marisa Hua   ??? 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 Compression fx, thoracic spine ([place]) 2007    negative DEXA Dr. Marisa Hua   ??? Diabetes ([facility]) 10/2017    on basis of fbs>125; intol metformin   ??? Dyslipidemia    ??? Dyspepsia    ??? Frozen shoulder     right Dr. Wynonia Hazard MRI   ??? Left thyroid nodule 04/2016    1cm nodule; apparently noted on CT 2008 and unchanged   ??? Multiple lung nodules     no change 10/06, 03/07, 03/08   ??? Overweight (BMI 25.0-29.9)     IF 7/18 start weight 168 lbs not doing    ??? Painless hematuria 04/2016    Dr Jimmye Norman, neg eval   ??? Palpitations 2008    neg thallium 2008, nl holter 2008, echo nl lv/ef 65%/tr mr/dd/nl pasp   ??? Prediabetes    ??? Syncope     neurocardiogenic by tilt 1994   ??? Venous insufficiency    ??? Vitamin D deficiency      Past Surgical History:   Procedure Laterality Date   ??? CARDIAC SURG PROCEDURE UNLIST      thallium 2005 neg; NST 1/17 negative ef 75%   ??? ECHO 2D ADULT  12/04    normal with EF 70%   ??? HX COLONOSCOPY      Dr Rosendo Gros 2007 neg; 04/22/18 neg   ??? HX GYN      s/p BTL    ??? HX HEMORRHOIDECTOMY      Dr. Rosendo Gros 2007   ??? HX HYSTERECTOMY  03/11/2017    Dr Sheral Flow   ??? HX ORTHOPAEDIC       DEXA -0.5 spine, -0.8 hip (1/18)   ??? HX UROLOGICAL  06/2016    Dr Jimmye Norman; bladder bx showed benign lesions   ??? [place] ABD COMP  9/05    negative   ??? VAS CAROTID DUPLEX BILATERAL  2004    negative     Social History     Socioeconomic History   ??? Marital status: MARRIED     Spouse name: Not on file   ??? Number of children: 2   ??? Years of education: Not on file   ??? Highest education level: Not on file   Occupational History   ??? Occupation: benefits specialist   Social Needs   ??? Financial resource strain: Not on file   ??? Food insecurity:     Worry: Not on file     Inability: Not  on file   ??? Transportation needs:     Medical: Not on file     Non-medical: Not on file   Tobacco Use   ??? Smoking status: Never Smoker   ??? Smokeless tobacco: Never Used   Substance and Sexual Activity   ??? Alcohol use: Yes     Alcohol/week: 4.0 standard drinks     Types: 4 Glasses of wine per week   ??? Drug use: No   ??? Sexual activity: Not on file   Lifestyle   ??? Physical activity:     Days per week: Not on file     Minutes per session: Not on file   ??? Stress: Not on file   Relationships   ??? Social connections:     Talks on phone: Not on file     Gets together: Not on file     Attends religious service: Not on file     Active member of club or organization: Not on file     Attends meetings of clubs or organizations: Not on file     Relationship status: Not on file   ??? Intimate partner violence:     Fear of current or ex partner: Not on file     Emotionally abused: Not on file     Physically abused: Not on file     Forced sexual activity: Not on file   Other Topics Concern   ??? Not on file   Social History Narrative   ??? Not on file     Current Outpatient Medications   Medication Sig   ??? SITagliptin (JANUVIA) 100 mg tablet Take 1 Tab by mouth daily.   ??? citalopram (CELEXA) 10 mg tablet take 1 tablet by mouth once daily   ??? lansoprazole (PREVACID) 30 mg capsule TAKE 1 CAPSULE BY MOUTH BEFORE BREAKFAST DAILY.   ??? Blood-Glucose Meter monitoring kit Use  daily as directed   ??? glucose blood VI test strips (BLOOD GLUCOSE TEST) strip Use daily as directed   ??? lancets misc Use daily as directed   ??? ergocalciferol (VITAMIN D2) 50,000 unit capsule Take 50,000 Units by mouth every thirty (30) days.   ??? atorvastatin (LIPITOR) 40 mg tablet take 1 tablet by mouth once daily   ??? omega-3 fatty acids (FISH OIL) cap Take 2 Tabs by mouth daily.   ??? aspirin 81 mg chewable tablet Take 81 mg by mouth daily.     No current facility-administered medications for this visit.      Allergies   Allergen Reactions   ??? Fish Oil Nausea Only   ??? Metformin Diarrhea   ??? Relafen [Nabumetone] Nausea Only     REVIEW OF SYSTEMS: gyn 4/18 Dr Andrew Au, mammo 3/19, colo 6/19 Dr Rosendo Gros, DEXA 1/18 Dr Margaretann Loveless ??? no vision change or eye pain  Oral ??? no mouth pain, tongue or tooth problems  Ears ??? no hearing loss, ear pain, fullness, no swallowing problems  Cardiac ??? no CP, PND, orthopnea, edema, palpitations or syncope  Chest ??? no breast masses  Resp ??? no wheezing, chronic coughing, dyspnea  Urinary ??? no dysuria, hematuria, flank pain, urgency, frequency    Visit Vitals  BP 121/78   Pulse 84   Temp 98.7 ??F (37.1 ??C) (Oral)   Resp 14   Ht '5\' 4"'  (1.626 m)   Wt 164 lb (74.4 kg)   SpO2 96%   BMI 28.15 kg/m??   Affect is appropriate.  Mood stable  No apparent distress  HEENT --Anicteric sclerae.  No JVD, or bruits.  Thyroid fullness on left  Lungs --Clear to auscultation and percussion, normal percussion.  Heart --Regular rate and rhythm, no murmurs, rubs, gallops, or clicks.  Abdomen -- Soft and nontender, no hepatosplenomegaly or masses.  Extremities -- Without cyanosis, clubbing, edema. 2+ pulses equally and bilaterally.    LABS  From 5/10 showed   gluc 110, cr 0.70,               alt 23,                                    chol 154, tg 143, hdl 45, ldl-c 80,                                                            tsh 1.91  From 5/10 showed                      2 hr GTT 89  From 8/10 showed                                                                vit d 23.0, ck 57, aldo 5.4  From 8/11 showed   gluc 108,                                     hba1c 6.3,                   chol 160, tg 172, hdl 39, ldl-c 87,  wbc 5.,7 hb 12.3, plt 235, ua neg,     tsh 2.28  From 5/12 showed   gluc 113, cr 0.71, gfr 94,  alt 16, hba1c 6.2, ldl-p 1989, chol 177, tg 161, hdl 43, ldl-c 102  From 11/12 showed                                                     hba1c 5.9, ldl-p 1218, chol 133, tg 116, hdl 45, ldl-c 65  From 5/13 showed   gluc 105, cr 0.55, gfr 102, alt 8,  hba1c 6.2,                   chol 148, tg 107, hdl 45, ldl-c 82,  wbc 5.0, hb 12.5, plt 172, vit d 40.3  From 11/13 showed        hba1c 6.2, ldl-p 1888, chol 176, tg 155, hdl 49, ldl-c 86,  wbc 6.2, hb 12.3, plt 182, vit d 34.4  From 5/14 showed        hba1c 6.2,     chol 152, tg 157, hdl 39, ldl-c 82  From 1/15 showed   gluc 113, cr 0.68,  gfr>60, alt 11, hba1c 6.2,     chol 146, tg 130, hdl 43, ldl-c 77  From 7/15 showed        hba1c 6.3,     chol 133, tg 124, hdl 39, ldl-c 69, wbc 6.3, hb 12.2, plt 193  From 6/16 showed   gluc 106, cr 0.74, gfr>60, alt 26, hba1c 6.3,     chol 126, tg 125, hdl 46, ldl-c 55, wbc 5.7, hb 13.2, plt 203, vit d 7.2,   tsh 1.69  From 12/16 showed gluc 110, cr 0.68, gfr>60, alt 30, hba1c 6.1,     chol 135, tg 147, hdl 47, ldl-c 59, wbc 6.0, hb 12.7, plt 197  From 6/17 showed   gluc 122, cr 0.71, gfr>60, alt 33, hba1c 6.2,     chol 153, tg 140, hdl 46, ldl-c 79, wbc 6.3, hb 13.1, plt 190,        tsh 1.92, hep c neg  From 1/18 showed        hba1c 6.2,    chol 154, tg 181, hdl 41, ldl-c 77  From 7/18 showed   gluc 129, cr 0.63, gfr>60, alt 26, hba1c 6.1,              wbc 6.0, hb 12.4, plt 193, vit d 79.0  From 1/19 showed   gluc 132, cr 0.83, gfr>60,    hba1c 6.1  From 4/19 showed        hba1c 7.0, umar 12.0, chol 124, tg 109, hdl 46, ldl-c 56,      vit d 56.1    Results for orders placed or performed during the hospital encounter  of 05/19/18   CBC W/O DIFF   Result Value Ref Range    WBC 5.8 4.6 - 13.2 K/uL    RBC 4.35 4.20 - 5.30 M/uL    HGB 13.0 12.0 - 16.0 g/dL    HCT 41.8 35.0 - 45.0 %    MCV 96.1 74.0 - 97.0 FL    MCH 29.9 24.0 - 34.0 PG    MCHC 31.1 31.0 - 37.0 g/dL    RDW 13.4 11.6 - 14.5 %    PLATELET 183 135 - 420 K/uL    MPV 10.2 9.2 - 16.1 FL   METABOLIC PANEL, COMPREHENSIVE   Result Value Ref Range    Sodium 141 136 - 145 mmol/L    Potassium 4.3 3.5 - 5.5 mmol/L    Chloride 107 100 - 111 mmol/L    CO2 30 21 - 32 mmol/L    Anion gap 4 3.0 - 18 mmol/L    Glucose 106 (H) 74 - 99 mg/dL    BUN 12 7.0 - 18 MG/DL    Creatinine 0.67 0.6 - 1.3 MG/DL    BUN/Creatinine ratio 18 12 - 20      GFR est AA >60 >60 ml/min/1.67m    GFR est non-AA >60 >60 ml/min/1.729m   Calcium 8.9 8.5 - 10.1 MG/DL    Bilirubin, total 0.3 0.2 - 1.0 MG/DL    ALT (SGPT) 25 13 - 56 U/L    AST (SGOT) 19 10 - 38 U/L    Alk. phosphatase 80 45 - 117 U/L    Protein, total 6.8 6.4 - 8.2 g/dL    Albumin 3.8 3.4 - 5.0 g/dL    Globulin 3.0 2.0 - 4.0 g/dL    A-G Ratio 1.3 0.8 - 1.7     HEMOGLOBIN A1C W/O EAG   Result  Value Ref Range    Hemoglobin A1c 6.0 (H) 4.2 - 5.6 %     Patient Active Problem List   Diagnosis Code   ??? Vitamin D deficiency E55.9   ??? Anxiety F41.9   ??? Dyslipidemia E78.5   ??? 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 Overweight (BMI 25.0-29.9) E66.3   ??? Controlled type 2 diabetes mellitus, without long-term current use of insulin (HCC) E11.9     Assessment and plan:  1. DM.  Continue current regimen.  F/u ophth  2. Hyperlipidemia. Continue current  3. Hypovitaminosis D. Continue current regimen.  4. Anxiety.  Continue celexa  5. Overweight.  Lifestyle and dietary measures.  Portion control reiterated.  6. Endometrial ca.  Per Dr Sheral Flow        RTC 2/20    Above conditions discussed at length and patient vocalized understanding.  All questions answered to patient satisfaction

## 2018-05-27 ENCOUNTER — Ambulatory Visit (INDEPENDENT_AMBULATORY_CARE_PROVIDER_SITE_OTHER): Payer: Medicare Other | Admitting: Family Medicine

## 2018-05-27 DIAGNOSIS — E538 Deficiency of other specified B group vitamins: Secondary | ICD-10-CM | POA: Diagnosis not present

## 2018-05-27 MED ORDER — CYANOCOBALAMIN 1000 MCG/ML IJ SOLN
1000.0000 ug | Freq: Once | INTRAMUSCULAR | Status: AC
  Administered 2018-05-27: 1000 ug via INTRAMUSCULAR

## 2018-05-27 NOTE — Progress Notes (Signed)
Mia [name] presents to the clinic for a B-12 injection.  Patient has no complaints of lethargy, palpitations or problems with medication.  Patient tolerated injection well in the RUOQ without any complications. -EH/RMA

## 2018-05-27 NOTE — Telephone Encounter (Signed)
Info mailed.  

## 2018-05-30 ENCOUNTER — Ambulatory Visit: Payer: Medicare Other | Admitting: Adult Health

## 2018-05-31 ENCOUNTER — Encounter: Payer: Self-pay | Admitting: Adult Health

## 2018-06-02 ENCOUNTER — Ambulatory Visit (INDEPENDENT_AMBULATORY_CARE_PROVIDER_SITE_OTHER): Payer: Medicare Other | Admitting: Adult Health

## 2018-06-02 ENCOUNTER — Encounter: Payer: Self-pay | Admitting: Adult Health

## 2018-06-02 VITALS — BP 122/78 | HR 62 | Ht 66.14 in | Wt 180.0 lb

## 2018-06-02 DIAGNOSIS — R413 Other amnesia: Secondary | ICD-10-CM | POA: Diagnosis not present

## 2018-06-02 MED ORDER — DONEPEZIL HCL 10 MG PO TABS
10.0000 mg | ORAL_TABLET | Freq: Every day | ORAL | 3 refills | Status: DC
Start: 2018-06-02 — End: 2019-06-15

## 2018-06-02 NOTE — Progress Notes (Signed)
I have read the note, and I agree with the clinical assessment and plan.  Mia [name]   

## 2018-06-02 NOTE — Patient Instructions (Signed)
Your Plan:  Continue Namenda Increase Aricept to 10 mg  Memory score has decreased  If your symptoms worsen or you develop new symptoms please let us know.    Thank you for coming to see [place] at [facility]. I hope we have been able to provide you high quality care today.  You may receive a patient satisfaction survey over the next few weeks. We would appreciate your feedback and comments so that we may continue to improve ourselves and the health of our patients.

## 2018-06-02 NOTE — Progress Notes (Signed)
PATIENT: Mia [name] DOB: 1951-04-03  REASON FOR VISIT: follow up HISTORY FROM: patient  HISTORY OF PRESENT ILLNESS: Today 06/02/18 Mia [name] is a 67 year old female with a history of memory disturbance.  She returns today for follow-up.  She feels that her memory has gotten worse.  She states that she is very forgetful.  She tends to forget people's names and mixes up their names.  She is able to complete all ADLs independently.  She operates a Teacher, music.  She reports that she has gotten lost on 2 occasions but this is not the normal for her.  Her husband feels that she is easy to agitate now. She also has fibromyalgia and is being treated by Dr. [name].  She remains on Namenda 10 mg twice a day and Aricept 5 mg daily.  HISTORY Mia [name] is a 67 year old right-handed white female with a history of a mild memory disorder.  The patient has been relatively stable over time with her memory.  She continues to have word finding problems and mild forgetfulness.  She continues to complain of some troubles with her balance, this has been a problem for several years.  She has not had any falls, she will stagger on occasion.  The patient does have some neck pain, she had a recent MRI of the cervical spine and lumbar spine.  She has been told that she has bilateral carpal tunnel syndrome and that she will need to have surgery for this and she may need a left total knee replacement.  The patient does have some left knee and leg discomfort.  She has weakness in the legs.  She has recently had episodes of increased heart rate, her Toprol dose was increased.  She does have a head tremor that is a side to side tremor.  She claims that in the past she was given a diagnosis of primary lateral sclerosis, there is no evidence of this on clinical examination.   REVIEW OF SYSTEMS: Out of a complete 14 system review of symptoms, the patient complains only of the following symptoms, and all other reviewed systems  are negative.   Incontinence of bladder, urgency, joint pain, joint swelling, back pain, apnea, sleep talking, runny nose, hearing loss, excessive sweating, memory loss, dizziness, speech difficulty, tremors, excessive thirst  ALLERGIES: Allergies  Allergen Reactions  . Codeine Nausea And Vomiting  . Myrbetriq  [Mirabegron] Other (See Comments)    SEVERE HEADACHE  . Percocet [Oxycodone-Acetaminophen] Nausea And Vomiting  . Celebrex [Celecoxib] Other (See Comments)    Other reaction(s): Other flushed  . Darvocet [Propoxyphene N-Acetaminophen] Nausea And Vomiting  . Erythromycin Nausea And Vomiting    Nausea and vomiting   . Hydrocodone Nausea And Vomiting  . Lyrica [Pregabalin] Swelling    Legs swelling   . Macrodantin [Nitrofurantoin] Nausea And Vomiting  . Toradol [Ketorolac Tromethamine] Nausea And Vomiting    Per patient, only PO form causes nausea and vomiting. Can take injection without issue  . Tramadol Nausea And Vomiting  . Verapamil Nausea And Vomiting    REACTION: intolerance    HOME MEDICATIONS: Outpatient Medications Prior to Visit  Medication Sig Dispense Refill  . AMBULATORY NON FORMULARY MEDICATION Medication Name: One touch ultra strips. Check fasting blood sugar in the morning and as needed.  Dx - [id number] Fax to [phone] strip 11  . AMBULATORY NON FORMULARY MEDICATION Medication Name: tdap x1 IM 1 vial 0  . atorvastatin (LIPITOR) 40 MG tablet Take 1 tablet (  40 mg total) by mouth daily. 90 tablet 3  . calcium carbonate (OS-CAL) 600 MG TABS Take 600 mg by mouth daily with breakfast.     . cetirizine (ZYRTEC) 10 MG tablet Take 10 mg by mouth as needed for allergies.    Marland Kitchen denosumab (PROLIA) 60 MG/ML SOLN injection Inject 60 mg into the skin every 6 (six) months. Administer in upper arm, thigh, or abdomen    . dicyclomine (BENTYL) 10 MG capsule Take 10 mg by mouth 4 (four) times daily as needed.  5  . donepezil (ARICEPT) 5 MG tablet TAKE 1 TABLET AT BEDTIME  90 tablet 3  . fesoterodine (TOVIAZ) 8 MG TB24 tablet Take 8 mg by mouth daily.    . furosemide (LASIX) 20 MG tablet Take 1 tablet (20 mg total) by mouth daily. 90 tablet 3  . gabapentin (NEURONTIN) 300 MG capsule TAKE 1 TO 2 CAPSULES BY MOUTH UP TO TWO TIMES DAILY AS NEEDED 180 capsule 3  . ibuprofen (ADVIL,MOTRIN) 800 MG tablet Take 1 tablet (800 mg total) by mouth daily with supper. 30 tablet 3  . leflunomide (ARAVA) 20 MG tablet TAKE 1 TABLET EVERY DAY 90 tablet 0  . losartan (COZAAR) 50 MG tablet Take 1 tablet (50 mg total) by mouth daily. 90 tablet 3  . Melatonin 5 MG TABS Take 5 mg by mouth at bedtime as needed.    . memantine (NAMENDA) 10 MG tablet TAKE 1 TABLET (10 MG TOTAL) 2 TIMES DAILY. 180 tablet 4  . metFORMIN (GLUCOPHAGE-XR) 500 MG 24 hr tablet Take 2 tablets (1,000 mg total) by mouth daily with breakfast. 180 tablet 1  . metoprolol tartrate (LOPRESSOR) 100 MG tablet Take 1.5 tablets (150 mg total) by mouth 2 (two) times daily. 270 tablet 3  . NON FORMULARY Place into the nose at bedtime. cpap machine    . ondansetron (ZOFRAN) 4 MG tablet TAKE 2 TABLETS BY MOUTH 2 TIMES A DAY AS NEEDED FOR NAUSEA FOR UP TO 4 DOSES. 20 tablet 0  . ONE TOUCH ULTRA TEST test strip USE TO TEST FASTING BLOOD SUGAR IN THE MORNING AND AS NEEDED 50 each 11  . pantoprazole (PROTONIX) 40 MG tablet Take 40 mg by mouth 2 (two) times daily.    . polyethylene glycol powder (MIRALAX) powder Take 17 g by mouth daily.    . potassium chloride SA (K-DUR,KLOR-CON) 20 MEQ tablet Take 1 tablet (20 mEq total) by mouth daily. 90 tablet 3  . primidone (MYSOLINE) 50 MG tablet TAKE 1 TABLET AT BEDTIME 90 tablet 1  . Probiotic Product (PROBIOTIC ADVANCED PO) Take 1 capsule by mouth daily.    . propranolol (INDERAL) 10 MG tablet Take 1 tablet (10 mg total) by mouth 4 (four) times daily as needed. 90 tablet 6  . venlafaxine XR (EFFEXOR-XR) 75 MG 24 hr capsule TAKE 1 CAPSULE BY MOUTH  DAILY WITH BREAKFAST 90 capsule 3  .  vitamin D, CHOLECALCIFEROL, 400 UNITS tablet Take 400 Units by mouth daily.      No facility-administered medications prior to visit.     PAST MEDICAL HISTORY: Past Medical History:  Diagnosis Date  . Anal fissure   . Anemia   . Arthritis   . Blood transfusion   . C. difficile colitis   . Chest pain    a. 01/2013 MV: EF 59%, no ischemia.  . Chronic Dyspnea    a. 01/2013 Echo: EF 60-65%, Gr 1 DD, PASP 25mmHg.  . Diabetes mellitus   .  Fibromyalgia   . Gall stones   . GERD (gastroesophageal reflux disease)    gastritis  . Hemorrhoids   . Hypertension   . Interstitial cystitis   . MCI (mild cognitive impairment) 11/25/2017  . Memory difficulty 12/14/2013  . Neuromuscular disorder ([place])    sclerosis  . Osteoporosis   . Peptic ulcer   . PONV (postoperative nausea and vomiting)   . Pseudogout   . Raynaud phenomenon   . Rectal bleeding   . Sleep apnea    a. on cpap.  Marland Kitchen SVT (supraventricular tachycardia) ([place])   . Syncope 09/10/2015  . Thyroid disease    hypothyroidism  . Tremor, essential 05/14/2016    PAST SURGICAL HISTORY: Past Surgical History:  Procedure Laterality Date  . APPENDECTOMY    . BLADDER SURGERY     x2  . BLADDER SURGERY    . BREAST BIOPSY    . CARDIAC CATHETERIZATION    . CARPAL TUNNEL RELEASE    . CATARACT EXTRACTION Bilateral   . CHOLECYSTECTOMY    . DILATION AND CURETTAGE OF UTERUS    . ENTEROCELE REPAIR     x2  . EYE SURGERY     retina  . hysterectomy - unknown type    . KNEE ARTHROPLASTY    . KNEE SURGERY     x6  . NECK SURGERY     fusion  . OOPHORECTOMY    . QUADRICEPS REPAIR Right   . RECTOCELE REPAIR     x2  . SHOULDER ARTHROSCOPY WITH SUBACROMIAL DECOMPRESSION, ROTATOR CUFF REPAIR AND BICEP TENDON REPAIR  10/06/2012   Procedure: SHOULDER ARTHROSCOPY WITH SUBACROMIAL DECOMPRESSION, ROTATOR CUFF REPAIR AND BICEP TENDON REPAIR;  Surgeon: Nita Sells, MD;  Location: [facility];  Service: Orthopedics;   Laterality: Right;  Arthroscopic  Repair  of  Subscapularis, Open Biceps Tenodesis  . SHOULDER SURGERY     bilateral- bones spur  . TONSILLECTOMY    . TOTAL KNEE ARTHROPLASTY    . TOTAL SHOULDER ARTHROPLASTY      FAMILY HISTORY: Family History  Problem Relation Age of Onset  . Heart attack Mother   . Stroke Mother   . Diabetes Mother   . Heart disease Mother   . Colon polyps Mother   . Asthma Mother   . Breast cancer Maternal Grandmother   . Wilson's disease Maternal Grandmother   . Pancreatic cancer Maternal Grandfather   . Heart disease Father   . Heart attack Father   . Asthma Father   . Stroke Sister   . Hypertension Sister   . Rheum arthritis Sister   . Dementia Sister   . Asthma Sister   . Lupus Sister   . Asthma Sister     SOCIAL HISTORY: Social History   Socioeconomic History  . Marital status: Married    Spouse name: Not on file  . Number of children: 2  . Years of education: 29  . Highest education level: Not on file  Occupational History  . Occupation: Retired  Scientific laboratory technician  . Financial resource strain: Not on file  . Food insecurity:    Worry: Not on file    Inability: Not on file  . Transportation needs:    Medical: Not on file    Non-medical: Not on file  Tobacco Use  . Smoking status: Never Smoker  . Smokeless tobacco: Never Used  Substance and Sexual Activity  . Alcohol use: No    Alcohol/week: 0.0 standard drinks  .  Drug use: No  . Sexual activity: Not on file  Lifestyle  . Physical activity:    Days per week: Not on file    Minutes per session: Not on file  . Stress: Not on file  Relationships  . Social connections:    Talks on phone: Not on file    Gets together: Not on file    Attends religious service: Not on file    Active member of club or organization: Not on file    Attends meetings of clubs or organizations: Not on file    Relationship status: Not on file  . Intimate partner violence:    Fear of current or ex partner:  Not on file    Emotionally abused: Not on file    Physically abused: Not on file    Forced sexual activity: Not on file  Other Topics Concern  . Not on file  Social History Narrative   Patient drinks about 3-4 cups of caffeine daily.   Patient is right handed.      PHYSICAL EXAM  Vitals:   06/02/18 1344  BP: 122/78  Pulse: 62  Weight: 180 lb (81.6 kg)  Height: 5' 6.14" (1.68 m)   Body mass index is 28.93 kg/m.   MMSE - Mini Mental State Exam 06/02/2018 11/25/2017 05/20/2017  Orientation to time 4 4 4   Orientation to Place 5 5 5   Registration 3 3 3   Attention/ Calculation 2 4 1   Recall 1 3 1   Language- name 2 objects 2 2 2   Language- repeat 1 1 1   Language- follow 3 step command 3 2 3   Language- read & follow direction 1 1 1   Write a sentence 1 1 1   Copy design 1 1 0  Total score 24 27 22      Generalized: Well developed, in no acute distress   Neurological examination  Mentation: Alert oriented to time, place, history taking. Follows all commands speech and language fluent Cranial nerve II-XII: Pupils were equal round reactive to light. Extraocular movements were full, visual field were full on confrontational test. Facial sensation and strength were normal. Uvula tongue midline. Head turning and shoulder shrug  were normal and symmetric. Motor: The motor testing reveals 5 over 5 strength of all 4 extremities. Good symmetric motor tone is noted throughout.  Sensory: Sensory testing is intact to soft touch on all 4 extremities. No evidence of extinction is noted.  Coordination: Cerebellar testing reveals good finger-nose-finger and heel-to-shin bilaterally.  Gait and station: Gait is slightly unsteady.  Romberg is unsteady but the patient's gait was unsteady prior to her putting her feet together. Reflexes: Deep tendon reflexes are symmetric and normal bilaterally.   DIAGNOSTIC DATA (LABS, IMAGING, TESTING) - I reviewed patient records, labs, notes, testing and imaging  myself where available.  Lab Results  Component Value Date   WBC 6.6 03/11/2018   HGB 12.3 03/11/2018   HCT 36.3 03/11/2018   MCV 92.1 03/11/2018   PLT 311 03/11/2018      Component Value Date/Time   NA 139 04/15/2018 1131   K 4.1 04/15/2018 1131   CL 102 04/15/2018 1131   CO2 24 04/15/2018 1131   GLUCOSE 124 (H) 04/15/2018 1131   GLUCOSE 85 03/11/2018 1129   BUN 8 04/15/2018 1131   CREATININE 0.90 04/15/2018 1131   CREATININE 0.75 03/11/2018 1129   CALCIUM 9.9 04/15/2018 1131   PROT 6.2 03/11/2018 1129   PROT 7.4 11/15/2015 1304   ALBUMIN 4.3 06/04/2017  1459   ALBUMIN 4.8 11/15/2015 1304   AST 30 03/11/2018 1129   ALT 31 (H) 03/11/2018 1129   ALKPHOS 85 06/04/2017 1459   BILITOT 0.3 03/11/2018 1129   BILITOT <0.2 11/15/2015 1304   GFRNONAA 67 04/15/2018 1131   GFRNONAA 83 03/11/2018 1129   GFRAA 77 04/15/2018 1131   GFRAA 96 03/11/2018 1129   Lab Results  Component Value Date   CHOL 119 06/18/2017   HDL 40 06/18/2017   LDLCALC 47 06/18/2017   TRIG 161 (H) 06/18/2017   CHOLHDL 3.0 06/18/2017   Lab Results  Component Value Date   HGBA1C 6.5 (A) 05/10/2018   Lab Results  Component Value Date   VITAMINB12 441 03/29/2017   Lab Results  Component Value Date   TSH 1.10 08/18/2017      ASSESSMENT AND PLAN 67 y.o. year old female  has a past medical history of Anal fissure, Anemia, Arthritis, Blood transfusion, C. difficile colitis, Chest pain, Chronic Dyspnea, Diabetes mellitus, Fibromyalgia, Gall stones, GERD (gastroesophageal reflux disease), Hemorrhoids, Hypertension, Interstitial cystitis, MCI (mild cognitive impairment) (11/25/2017), Memory difficulty (12/14/2013), Neuromuscular disorder ([place]), Osteoporosis, Peptic ulcer, PONV (postoperative nausea and vomiting), Pseudogout, Raynaud phenomenon, Rectal bleeding, Sleep apnea, SVT (supraventricular tachycardia) ([place]), Syncope (09/10/2015), Thyroid disease, and Tremor, essential (05/14/2016). here with:  1.   Memory disturbance  The patient's memory score has declined since the last visit.  She will continue on Namenda 10 mg twice a day.  She will increase Aricept to 10 mg at bedtime.  She is advised that if her symptoms worsen or she develops new symptoms she should let us know.  She will follow-up in 8 months or sooner if needed.    Zirbes Givens, MSN, NP-C 06/02/2018, 2:08 PM [facility] [address], [place], [place] [address] [phone]

## 2018-06-07 DIAGNOSIS — N3941 Urge incontinence: Secondary | ICD-10-CM | POA: Diagnosis not present

## 2018-06-09 ENCOUNTER — Encounter: Payer: Self-pay | Admitting: Physician Assistant

## 2018-06-09 ENCOUNTER — Ambulatory Visit (INDEPENDENT_AMBULATORY_CARE_PROVIDER_SITE_OTHER): Payer: Medicare Other | Admitting: Physician Assistant

## 2018-06-09 VITALS — BP 106/75 | HR 72 | Resp 16 | Ht 66.0 in | Wt 175.8 lb

## 2018-06-09 DIAGNOSIS — M1120 Other chondrocalcinosis, unspecified site: Secondary | ICD-10-CM

## 2018-06-09 DIAGNOSIS — M5134 Other intervertebral disc degeneration, thoracic region: Secondary | ICD-10-CM

## 2018-06-09 DIAGNOSIS — M5136 Other intervertebral disc degeneration, lumbar region: Secondary | ICD-10-CM | POA: Diagnosis not present

## 2018-06-09 DIAGNOSIS — M0609 Rheumatoid arthritis without rheumatoid factor, multiple sites: Secondary | ICD-10-CM

## 2018-06-09 DIAGNOSIS — N3010 Interstitial cystitis (chronic) without hematuria: Secondary | ICD-10-CM | POA: Diagnosis not present

## 2018-06-09 DIAGNOSIS — M818 Other osteoporosis without current pathological fracture: Secondary | ICD-10-CM | POA: Diagnosis not present

## 2018-06-09 DIAGNOSIS — M5030 Other cervical disc degeneration, unspecified cervical region: Secondary | ICD-10-CM | POA: Diagnosis not present

## 2018-06-09 DIAGNOSIS — Z8619 Personal history of other infectious and parasitic diseases: Secondary | ICD-10-CM

## 2018-06-09 DIAGNOSIS — M797 Fibromyalgia: Secondary | ICD-10-CM

## 2018-06-09 DIAGNOSIS — Z8679 Personal history of other diseases of the circulatory system: Secondary | ICD-10-CM

## 2018-06-09 DIAGNOSIS — M1712 Unilateral primary osteoarthritis, left knee: Secondary | ICD-10-CM

## 2018-06-09 DIAGNOSIS — Z79899 Other long term (current) drug therapy: Secondary | ICD-10-CM

## 2018-06-09 DIAGNOSIS — R682 Dry mouth, unspecified: Secondary | ICD-10-CM | POA: Diagnosis not present

## 2018-06-09 DIAGNOSIS — R5382 Chronic fatigue, unspecified: Secondary | ICD-10-CM

## 2018-06-09 DIAGNOSIS — Z8719 Personal history of other diseases of the digestive system: Secondary | ICD-10-CM

## 2018-06-09 DIAGNOSIS — Z96651 Presence of right artificial knee joint: Secondary | ICD-10-CM

## 2018-06-09 DIAGNOSIS — Z9889 Other specified postprocedural states: Secondary | ICD-10-CM

## 2018-06-09 DIAGNOSIS — I7300 Raynaud's syndrome without gangrene: Secondary | ICD-10-CM

## 2018-06-09 MED ORDER — ATORVASTATIN 40 MG TAB
40 mg | ORAL_TABLET | ORAL | 3 refills | Status: DC
Start: 2018-06-09 — End: 2019-03-21

## 2018-06-09 MED ORDER — LEFLUNOMIDE 20 MG PO TABS: 20.0000 mg | ORAL_TABLET | Freq: Every day | ORAL | 0 refills | Status: DC

## 2018-06-09 NOTE — Patient Instructions (Signed)
Standing Labs We placed an order today for your standing lab work.    Please come back and get your standing labs in November and every 3 months   We have open lab Monday through Friday from 8:30-11:30 AM and 1:30-4:00 PM  at the office of Dr. Shaili Deveshwar.   You may experience shorter wait times on Monday and Friday afternoons. The office is located at [address], [address], [place], [place] [address] No appointment is necessary.   Labs are drawn by [facility].  You may receive a bill from [facility] for your lab work. If you have any questions regarding directions or hours of operation,  please call [phone].     

## 2018-06-10 LAB — CBC WITH DIFFERENTIAL/PLATELET
Basophils Absolute: 62 cells/uL (ref 0–200)
Basophils Relative: 0.9 %
Eosinophils Absolute: 138 cells/uL (ref 15–500)
Eosinophils Relative: 2 %
HCT: 43 % (ref 35.0–45.0)
Hemoglobin: 14.1 g/dL (ref 11.7–15.5)
Lymphs Abs: 1566 cells/uL (ref 850–3900)
MCH: 30.8 pg (ref 27.0–33.0)
MCHC: 32.8 g/dL (ref 32.0–36.0)
MCV: 93.9 fL (ref 80.0–100.0)
MPV: 10.8 fL (ref 7.5–12.5)
Monocytes Relative: 12.4 %
Neutro Abs: 4278 cells/uL (ref 1500–7800)
Neutrophils Relative %: 62 %
Platelets: 376 10*3/uL (ref 140–400)
RBC: 4.58 10*6/uL (ref 3.80–5.10)
RDW: 12.7 % (ref 11.0–15.0)
Total Lymphocyte: 22.7 %
WBC mixed population: 856 cells/uL (ref 200–950)
WBC: 6.9 10*3/uL (ref 3.8–10.8)

## 2018-06-10 LAB — COMPLETE METABOLIC PANEL WITH GFR
AG Ratio: 1.7 (calc) (ref 1.0–2.5)
ALT: 26 U/L (ref 6–29)
AST: 28 U/L (ref 10–35)
Albumin: 4.7 g/dL (ref 3.6–5.1)
Alkaline phosphatase (APISO): 82 U/L (ref 33–130)
BUN: 13 mg/dL (ref 7–25)
CO2: 29 mmol/L (ref 20–32)
Calcium: 10.5 mg/dL — ABNORMAL HIGH (ref 8.6–10.4)
Chloride: 100 mmol/L (ref 98–110)
Creat: 0.95 mg/dL (ref 0.50–0.99)
GFR, Est African American: 72 mL/min/{1.73_m2} (ref >=60)
GFR, Est Non African American: 62 mL/min/{1.73_m2} (ref >=60)
Globulin: 2.7 g/dL (calc) (ref 1.9–3.7)
Glucose, Bld: 87 mg/dL (ref 65–99)
Potassium: 4.6 mmol/L (ref 3.5–5.3)
Sodium: 136 mmol/L (ref 135–146)
Total Bilirubin: 0.3 mg/dL (ref 0.2–1.2)
Total Protein: 7.4 g/dL (ref 6.1–8.1)

## 2018-06-10 LAB — SJOGRENS SYNDROME-B EXTRACTABLE NUCLEAR ANTIBODY: SSB (La) (ENA) Antibody, IgG: <1 AI

## 2018-06-10 LAB — SJOGRENS SYNDROME-A EXTRACTABLE NUCLEAR ANTIBODY: SSA (Ro) (ENA) Antibody, IgG: <1 AI

## 2018-06-10 NOTE — Progress Notes (Signed)
Calcium borderline elevated. All other labs are WNL.

## 2018-06-14 DIAGNOSIS — M47816 Spondylosis without myelopathy or radiculopathy, lumbar region: Secondary | ICD-10-CM | POA: Diagnosis not present

## 2018-06-23 ENCOUNTER — Ambulatory Visit (INDEPENDENT_AMBULATORY_CARE_PROVIDER_SITE_OTHER): Payer: Medicare Other | Admitting: Family Medicine

## 2018-06-23 ENCOUNTER — Encounter: Payer: Self-pay | Admitting: Family Medicine

## 2018-06-23 VITALS — BP 139/67 | HR 64 | Temp 97.9°F

## 2018-06-23 DIAGNOSIS — E538 Deficiency of other specified B group vitamins: Secondary | ICD-10-CM

## 2018-06-23 DIAGNOSIS — Z23 Encounter for immunization: Secondary | ICD-10-CM | POA: Diagnosis not present

## 2018-06-23 MED ORDER — CYANOCOBALAMIN 1000 MCG/ML IJ SOLN
1000.0000 ug | Freq: Once | INTRAMUSCULAR | Status: AC
  Administered 2018-06-23: 1000 ug via INTRAMUSCULAR

## 2018-06-23 NOTE — Progress Notes (Signed)
Pt seen in clinic today for Vitamin B12 injection. Pt has no complaints, does report some decreased energy when she is due for her injection. Denies any chest pain, SOB or palpitations. Pt also wants her flu vaccination today. Patient was given a flu shot questionnaire prior to administration of immunization. All questions were answered appropriately. Patient tolerated injection of flu immunization in left deltoid and B12 in LUOQ well, with no immediate complications. Advised to contact our office with any questions/concerns.

## 2018-06-23 NOTE — Progress Notes (Signed)
Agree with documentation as above.   Kolston Lacount, MD  

## 2018-06-24 DIAGNOSIS — M533 Sacrococcygeal disorders, not elsewhere classified: Secondary | ICD-10-CM | POA: Diagnosis not present

## 2018-06-28 DIAGNOSIS — M47816 Spondylosis without myelopathy or radiculopathy, lumbar region: Secondary | ICD-10-CM | POA: Diagnosis not present

## 2018-06-30 ENCOUNTER — Encounter (HOSPITAL_COMMUNITY): Payer: Self-pay | Admitting: *Deleted

## 2018-06-30 ENCOUNTER — Encounter: Payer: Self-pay | Admitting: Family Medicine

## 2018-06-30 ENCOUNTER — Other Ambulatory Visit: Payer: Self-pay | Admitting: Orthopedic Surgery

## 2018-06-30 ENCOUNTER — Emergency Department (HOSPITAL_COMMUNITY)
Admission: EM | Admit: 2018-06-30 | Discharge: 2018-06-30 | Disposition: A | Discharge status: Home / Self Care | Payer: Medicare Other | Attending: Emergency Medicine | Admitting: Emergency Medicine

## 2018-06-30 DIAGNOSIS — I10 Essential (primary) hypertension: Secondary | ICD-10-CM | POA: Diagnosis not present

## 2018-06-30 DIAGNOSIS — G8929 Other chronic pain: Secondary | ICD-10-CM | POA: Diagnosis not present

## 2018-06-30 DIAGNOSIS — M545 Low back pain, unspecified: Secondary | ICD-10-CM

## 2018-06-30 DIAGNOSIS — N3941 Urge incontinence: Secondary | ICD-10-CM | POA: Diagnosis not present

## 2018-06-30 DIAGNOSIS — R350 Frequency of micturition: Secondary | ICD-10-CM | POA: Diagnosis not present

## 2018-06-30 DIAGNOSIS — Z79899 Other long term (current) drug therapy: Secondary | ICD-10-CM | POA: Insufficient documentation

## 2018-06-30 DIAGNOSIS — Z7984 Long term (current) use of oral hypoglycemic drugs: Secondary | ICD-10-CM | POA: Diagnosis not present

## 2018-06-30 DIAGNOSIS — M533 Sacrococcygeal disorders, not elsewhere classified: Secondary | ICD-10-CM

## 2018-06-30 MED ORDER — HYDROMORPHONE HCL 1 MG/ML IJ SOLN
1.0000 mg | Freq: Once | INTRAMUSCULAR | Status: AC
  Administered 2018-06-30: 1 mg via INTRAMUSCULAR
  Filled 2018-06-30: qty 1

## 2018-06-30 MED ORDER — ONDANSETRON 4 MG PO TBDP
4.0000 mg | ORAL_TABLET | Freq: Once | ORAL | Status: AC
  Administered 2018-06-30: 4 mg via ORAL
  Filled 2018-06-30: qty 1

## 2018-06-30 MED ORDER — DIAZEPAM 2 MG PO TABS
2.0000 mg | ORAL_TABLET | Freq: Once | ORAL | Status: AC
  Administered 2018-06-30: 2 mg via ORAL
  Filled 2018-06-30: qty 1

## 2018-06-30 MED ORDER — ONDANSETRON 4 MG PO TBDP: 4.0000 mg | ORAL_TABLET | Freq: Once | ORAL | Status: DC

## 2018-06-30 NOTE — ED Notes (Signed)
Pt c/o sweating and nausea. NP advised. Will repeat zofran

## 2018-06-30 NOTE — ED Triage Notes (Signed)
Pt w/ of facet arthritis complains of lower back pain radiating down her out left thigh. Pt has tried TENS unit, salonpas, tramadol w/o relief. Pt was advised to come to ED for pain control.

## 2018-06-30 NOTE — ED Notes (Signed)
Pt is very sleepy due to medication. Pt remains on O2 sat moniter

## 2018-06-30 NOTE — ED Provider Notes (Signed)
[facility] DEPT Provider Note   CSN: [id number] Arrival date & time: 06/30/18  1427     History   Chief Complaint Chief Complaint  Patient presents with  . Back Pain    HPI Mia [name] is a 67 y.o. female with multiple medical problems as listed in [place] who presents to the ED with left side back pain that radiates to the left thigh. Patient has been seeing her doctor and going through a series of injections, TENS unit and the doctor thinks the pain is from the SI joint. 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 Patient states she takes a lot of medications and is allergic to a lot of medications. She usually has to get IM medications due to nausea from PO medications. Patient called her doctor and spoke with the PA and he told her to come to the ED for pain management.   The history is provided by the patient. No language interpreter was used.  Back Pain   This is a chronic problem. The current episode started more than 1 week ago. The problem occurs constantly. The quality of the pain is described as burning. The symptoms are aggravated by bending, twisting and certain positions. Pertinent negatives include no bowel incontinence, no bladder incontinence and no dysuria.    Past Medical History:  Diagnosis Date  . Anal fissure   . Anemia   . Arthritis   . Blood transfusion   . C. difficile colitis   . Chest pain    a. 01/2013 MV: EF 59%, no ischemia.  . Chronic Dyspnea    a. 01/2013 Echo: EF 60-65%, Gr 1 DD, PASP 77mmHg.  . Diabetes mellitus   . Fibromyalgia   . Gall stones   . GERD (gastroesophageal reflux disease)    gastritis  . Hemorrhoids   . Hypertension   . Interstitial cystitis   . MCI (mild cognitive impairment) 11/25/2017  . Memory difficulty 12/14/2013  . Neuromuscular disorder ([place])    sclerosis  . Osteoporosis   . Peptic ulcer   . PONV (postoperative nausea and  vomiting)   . Pseudogout   . Raynaud phenomenon   . Rectal bleeding   . Sleep apnea    a. on cpap.  Marland Kitchen SVT (supraventricular tachycardia) ([place])   . Syncope 09/10/2015  . Thyroid disease    hypothyroidism  . Tremor, essential 05/14/2016    Patient Active Problem List   Diagnosis Date Noted  . Facet arthritis of lumbar region 05/10/2018  . Hypokalemia 05/10/2018  . MCI (mild cognitive impairment) 11/25/2017  . Restrictive lung disease 07/30/2017  . Mixed hyperlipidemia 06/18/2017  . Osteoporosis 04/18/2017  . Trochanteric bursitis of both hips 02/24/2017  . Stress fracture of left tibia 11/05/2016  . Inflammatory arthritis 09/30/2016  . High risk medication use 09/30/2016  . History of Clostridium difficile colitis 09/30/2016  . 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 Pseudogout 09/30/2016  . DDD cervical spine status post fusion 09/30/2016  . DDD thoracic spine 09/30/2016  . H/O total knee replacement, right 09/30/2016  . Age-related osteoporosis without current pathological fracture 09/30/2016  . Tremor, essential 05/14/2016  . Right foot pain 04/14/2016  . B12 deficiency 09/10/2015  . Syncope 09/10/2015  . Cervical facet joint syndrome 09/10/2015  . Chronic fatigue 09/10/2015  . Aortic atherosclerosis ([place]) 04/01/2015  . Primary osteoarthritis of left knee 02/27/2015  . DOE (dyspnea on exertion)   .  Benign head tremor 06/11/2014  . Lumbar degenerative disc disease 06/11/2014  . IFG (impaired fasting glucose) 03/09/2014  . Osteopenia 03/09/2014  . Hemorrhoid 03/09/2014  . Retinal wrinkling, right eye 03/09/2014  . Fibromyalgia 03/09/2014  . GERD (gastroesophageal reflux disease) 03/09/2014  . History of arthroplasty of right knee 01/31/2014  . Memory difficulty 12/14/2013  . Hemorrhoids, external, thrombosed 06/22/2011  . Hypertension 03/11/2011  . SVT (supraventricular tachycardia) ([place])   . Raynaud phenomenon   . EDEMA 08/25/2010  . Nonspecific  (abnormal) findings on radiological and other examination of body structure 08/25/2010  . COMPUTERIZED TOMOGRAPHY, CHEST, ABNORMAL 08/25/2010  . OSA (obstructive sleep apnea) 04/24/2009  . Allergic rhinitis 06/11/2008  . Dyspnea 06/11/2008  . 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 Chronic diastolic CHF (congestive heart failure) ([place]) 06/08/2008  . INTERSTITIAL CYSTITIS 06/08/2008    Past Surgical History:  Procedure Laterality Date  . APPENDECTOMY    . BLADDER SURGERY     x2  . BLADDER SURGERY    . BREAST BIOPSY    . CARDIAC CATHETERIZATION    . CARPAL TUNNEL RELEASE    . CATARACT EXTRACTION Bilateral   . CHOLECYSTECTOMY    . DILATION AND CURETTAGE OF UTERUS    . ENTEROCELE REPAIR     x2  . EYE SURGERY     retina  . hysterectomy - unknown type    . JOINT REPLACEMENT    . KNEE ARTHROPLASTY    . KNEE SURGERY     x6  . NECK SURGERY     fusion  . OOPHORECTOMY    . QUADRICEPS REPAIR Right   . RECTOCELE REPAIR     x2  . SHOULDER ARTHROSCOPY WITH SUBACROMIAL DECOMPRESSION, ROTATOR CUFF REPAIR AND BICEP TENDON REPAIR  10/06/2012   Procedure: SHOULDER ARTHROSCOPY WITH SUBACROMIAL DECOMPRESSION, ROTATOR CUFF REPAIR AND BICEP TENDON REPAIR;  Surgeon: Nita Sells, MD;  Location: [facility];  Service: Orthopedics;  Laterality: Right;  Arthroscopic  Repair  of  Subscapularis, Open Biceps Tenodesis  . SHOULDER SURGERY     bilateral- bones spur  . TONSILLECTOMY    . TOTAL KNEE ARTHROPLASTY    . TOTAL SHOULDER ARTHROPLASTY       OB History   None      Home Medications    Prior to Admission medications   Medication Sig Start Date End Date Taking? Authorizing Provider  AMBULATORY NON FORMULARY MEDICATION Medication Name: One touch ultra strips. Check fasting blood sugar in the morning and as needed.  Dx - [phone] Fax to [phone] 02/04/17   Hali Marry, MD  AMBULATORY NON FORMULARY MEDICATION Medication Name: tdap x1 IM  05/10/18   Hali Marry, MD  atorvastatin (LIPITOR) 40 MG tablet Take 1 tablet (40 mg total) by mouth daily. 06/21/17   Nahser, Wonda Cheng, MD  calcium carbonate (OS-CAL) 600 MG TABS Take 600 mg by mouth daily with breakfast.     [provider]  cetirizine (ZYRTEC) 10 MG tablet Take 10 mg by mouth as needed for allergies.    [provider]  Cyanocobalamin (VITAMIN B-12 IJ) Inject as directed.    [provider]  denosumab (PROLIA) 60 MG/ML SOLN injection Inject 60 mg into the skin every 6 (six) months. Administer in upper arm, thigh, or abdomen    [provider]  donepezil (ARICEPT) 10 MG tablet Take 1 tablet (10 mg total) by mouth at bedtime. 06/02/18   Royse Givens, NP  fesoterodine (TOVIAZ) 8  MG TB24 tablet Take 8 mg by mouth daily.    [provider]  furosemide (LASIX) 20 MG tablet Take 1 tablet (20 mg total) by mouth daily. 09/28/16   Nahser, Wonda Cheng, MD  gabapentin (NEURONTIN) 300 MG capsule TAKE 1 TO 2 CAPSULES BY MOUTH UP TO TWO TIMES DAILY AS NEEDED 09/21/17   Hali Marry, MD  ibuprofen (ADVIL,MOTRIN) 800 MG tablet Take 1 tablet (800 mg total) by mouth daily with supper. 12/03/17   Silverio Decamp, MD  leflunomide (ARAVA) 20 MG tablet Take 1 tablet (20 mg total) by mouth daily. 06/09/18   [name], PA-C  losartan (COZAAR) 50 MG tablet Take 1 tablet (50 mg total) by mouth daily. 03/11/18   Nahser, Wonda Cheng, MD  Melatonin 5 MG TABS Take 5 mg by mouth at bedtime as needed.    [provider]  memantine (NAMENDA) 10 MG tablet TAKE 1 TABLET (10 MG TOTAL) 2 TIMES DAILY. 01/17/18   Kathrynn Ducking, MD  metFORMIN (GLUCOPHAGE-XR) 500 MG 24 hr tablet Take 2 tablets (1,000 mg total) by mouth daily with breakfast. 05/10/18   Hali Marry, MD  metoprolol tartrate (LOPRESSOR) 100 MG tablet Take 1.5 tablets (150 mg total) by mouth 2 (two) times daily. 12/08/17   Nahser, Wonda Cheng, MD  NON FORMULARY Place into the  nose at bedtime. cpap machine    [provider]  ondansetron (ZOFRAN) 4 MG tablet TAKE 2 TABLETS BY MOUTH 2 TIMES A DAY AS NEEDED FOR NAUSEA FOR UP TO 4 DOSES. 02/28/18   Hali Marry, MD  ONE TOUCH ULTRA TEST test strip USE TO TEST FASTING BLOOD SUGAR IN THE MORNING AND AS NEEDED 02/23/18   Hali Marry, MD  pantoprazole (PROTONIX) 40 MG tablet Take 40 mg by mouth 2 (two) times daily.    [provider]  polyethylene glycol powder (MIRALAX) powder Take 17 g by mouth daily. 12/16/17   [provider]  potassium chloride SA (K-DUR,KLOR-CON) 20 MEQ tablet Take 1 tablet (20 mEq total) by mouth daily. 04/01/18   Nahser, Wonda Cheng, MD  primidone (MYSOLINE) 50 MG tablet TAKE 1 TABLET AT BEDTIME 03/22/18   Hali Marry, MD  Probiotic Product (PROBIOTIC ADVANCED PO) Take 1 capsule by mouth daily.    [provider]  venlafaxine XR (EFFEXOR-XR) 75 MG 24 hr capsule TAKE 1 CAPSULE BY MOUTH  DAILY WITH BREAKFAST 03/08/18   Kathrynn Ducking, MD  vitamin D, CHOLECALCIFEROL, 400 UNITS tablet Take 400 Units by mouth daily.     [provider]    Family History Family History  Problem Relation Age of Onset  . Heart attack Mother   . Stroke Mother   . Diabetes Mother   . Heart disease Mother   . Colon polyps Mother   . Asthma Mother   . Breast cancer Maternal Grandmother   . Wilson's disease Maternal Grandmother   . Pancreatic cancer Maternal Grandfather   . Heart disease Father   . Heart attack Father   . Asthma Father   . Stroke Sister   . Hypertension Sister   . Rheum arthritis Sister   . Dementia Sister   . Asthma Sister   . Lupus Sister   . Asthma Sister     Social History Social History   Tobacco Use  . Smoking status: Never Smoker  . Smokeless tobacco: Never Used  Substance Use Topics  . Alcohol use: No  Alcohol/week: 0.0 standard drinks  . Drug use: No     Allergies   Codeine; Myrbetriq  [mirabegron]; Percocet  [oxycodone-acetaminophen]; Celebrex [celecoxib]; Darvocet [propoxyphene n-acetaminophen]; Erythromycin; Hydrocodone; Lyrica [pregabalin]; Macrodantin [nitrofurantoin]; Toradol [ketorolac tromethamine]; Tramadol; and Verapamil   Review of Systems Review of Systems  Gastrointestinal: Negative for bowel incontinence.  Genitourinary: Negative for bladder incontinence and dysuria.  Musculoskeletal: Positive for arthralgias and back pain.  All other systems reviewed and are negative.    Physical Exam Updated Vital Signs BP (!) 139/97 (BP Location: Left Arm)   Pulse 70   Temp 97.8 F (36.6 C) (Oral)   Resp 16   SpO2 99%   Physical Exam  Constitutional: She appears well-developed and well-nourished.  HENT:  Head: Normocephalic.  Eyes: EOM are normal.  Neck: Neck supple.  Cardiovascular: Normal rate.  Pulmonary/Chest: Effort normal.  Abdominal: Soft. There is no tenderness.  Musculoskeletal:       Lumbar back: She exhibits decreased range of motion, tenderness and spasm. She exhibits normal pulse.  Neurological: She is alert. She has normal strength.  Reflex Scores:      Bicep reflexes are 2+ on the right side and 2+ on the left side.      Brachioradialis reflexes are 2+ on the right side and 2+ on the left side.      Patellar reflexes are 2+ on the right side and 2+ on the left side. Grips are equal, ambulatory without foot drag  Skin: Skin is warm and dry.  Psychiatric: She has a normal mood and affect. Her behavior is normal.  Nursing note and vitals reviewed.    ED Treatments / Results  Labs (all labs ordered are listed, but only abnormal results are displayed) Labs Reviewed - No data to display  Radiology No results found.  Procedures Procedures (including critical care time)  Medications Ordered in ED Medications  ondansetron (ZOFRAN-ODT) disintegrating tablet 4 mg (4 mg Oral Given 06/30/18 1718)  diazepam (VALIUM) tablet 2 mg (2 mg Oral Given 06/30/18 1718)    HYDROmorphone (DILAUDID) injection 1 mg (1 mg Intramuscular Given 06/30/18 1837)   Patient reported minimal pain relief with Valium. Will give Dilaudid 1 mg  IM.  Initial Impression / Assessment and Plan / ED Course  I have reviewed the triage vital signs and the nursing notes. Patient with back pain.  No neurological deficits and normal neuro exam.  Patient can walk but states is painful.  No loss of bowel or bladder control.  No concern for cauda equina.  No fever, night sweats, weight loss, h/o cancer, IVDU.  RICE protocol and pain medicine indicated and discussed with patient.   Final Clinical Impressions(s) / ED Diagnoses   Final diagnoses:  Acute exacerbation of chronic low back pain    ED Discharge Orders    None       Debroah Baller [place], NP 06/30/18 [place], [place], DO 06/30/18 2303

## 2018-06-30 NOTE — ED Notes (Signed)
Pt's O2 sat steady at 95% on RA. Pt speaking in full sentences and in no distress. RN asked pt if she would like to wait a little longer. Pt drinking soda. PT reports to RN she is ready to leave the ED and "go sleep in her bed". Pt assisted out in Wheelchair. Pt's husband reports there are no steps to get into the house.  Pt able to stand and pivot without difficulty to car and to wheelchair.  Pt assisted into car by nurse.

## 2018-06-30 NOTE — Discharge Instructions (Addendum)
Follow up with your doctor for further evaluation and treatment.

## 2018-07-11 ENCOUNTER — Ambulatory Visit
Admission: RE | Admit: 2018-07-11 | Discharge: 2018-07-11 | Disposition: A | Discharge status: Home / Self Care | Payer: Medicare Other | Source: Ambulatory Visit | Attending: Orthopedic Surgery | Admitting: Orthopedic Surgery

## 2018-07-11 DIAGNOSIS — M533 Sacrococcygeal disorders, not elsewhere classified: Secondary | ICD-10-CM

## 2018-07-18 ENCOUNTER — Other Ambulatory Visit: Payer: Self-pay | Admitting: Cardiovascular Disease

## 2018-07-18 DIAGNOSIS — M5416 Radiculopathy, lumbar region: Secondary | ICD-10-CM | POA: Diagnosis not present

## 2018-07-19 DIAGNOSIS — M545 Low back pain: Secondary | ICD-10-CM | POA: Diagnosis not present

## 2018-07-25 ENCOUNTER — Ambulatory Visit: Payer: Medicare Other

## 2018-07-26 DIAGNOSIS — M5416 Radiculopathy, lumbar region: Secondary | ICD-10-CM | POA: Diagnosis not present

## 2018-07-29 DIAGNOSIS — R350 Frequency of micturition: Secondary | ICD-10-CM | POA: Diagnosis not present

## 2018-07-29 DIAGNOSIS — N3941 Urge incontinence: Secondary | ICD-10-CM | POA: Diagnosis not present

## 2018-08-02 DIAGNOSIS — M5416 Radiculopathy, lumbar region: Secondary | ICD-10-CM | POA: Diagnosis not present

## 2018-08-09 ENCOUNTER — Ambulatory Visit (INDEPENDENT_AMBULATORY_CARE_PROVIDER_SITE_OTHER): Payer: Medicare Other | Admitting: Family Medicine

## 2018-08-09 ENCOUNTER — Encounter: Payer: Self-pay | Admitting: Family Medicine

## 2018-08-09 VITALS — BP 125/64 | HR 75 | Ht 66.0 in | Wt 172.0 lb

## 2018-08-09 DIAGNOSIS — M797 Fibromyalgia: Secondary | ICD-10-CM

## 2018-08-09 DIAGNOSIS — S60415A Abrasion of left ring finger, initial encounter: Secondary | ICD-10-CM | POA: Diagnosis not present

## 2018-08-09 DIAGNOSIS — I10 Essential (primary) hypertension: Secondary | ICD-10-CM

## 2018-08-09 DIAGNOSIS — R7301 Impaired fasting glucose: Secondary | ICD-10-CM | POA: Diagnosis not present

## 2018-08-09 DIAGNOSIS — E538 Deficiency of other specified B group vitamins: Secondary | ICD-10-CM

## 2018-08-09 LAB — POCT GLYCOSYLATED HEMOGLOBIN (HGB A1C): Hemoglobin A1C: 5.8 % — AB (ref 4.0–5.6)

## 2018-08-09 MED ORDER — CYANOCOBALAMIN 1000 MCG/ML IJ SOLN
1000.0000 ug | Freq: Once | INTRAMUSCULAR | Status: AC
  Administered 2018-08-09: 1000 ug via INTRAMUSCULAR

## 2018-08-09 NOTE — Progress Notes (Signed)
Subjective:    CC: Fibromyalgia   HPI:  F/U Fibromyalgia -overall she is stable and doing okay.  She has had some recent problems with her low back.  In fact she recently saw Dr. Lynann Bologna and they did an MRI which showed a cyst on her L5 nerve as well as some nerve compression at L4.  They decided to do an injection on L5 And she did get pain relief for about 2 days.  But then her pain returned.  Then they did an injection at L4 and she was no longer having foot drop by the end of the day but really did not get any significant pain relief.  She is now on Dilaudid for pain control.  So noted that she is having to take more naps and feels more sleepy and fatigued.  Hypertension- Pt denies chest pain, SOB, dizziness, or heart palpitations.  Taking meds as directed w/o problems.  Denies medication side effects.    Impaired fasting glucose-no increased thirst or urination. No symptoms consistent with hypoglycemia. We increased the metformin to 1000mg .  She says she is worked hard to cut back on her carb intake.  She also complains of a constant postnasal drip and runny nose.  But she may need to go up to 3 times a day for better control.  She also wanted me to look at a wound that is been present on her right hand on the lateral fourth finger for about 2 months.  She has been using Neosporin and triple antibiotic ointment on it and it just does not seem to be healing.  The wound is not open or draining.  No fevers chills or sweats.  But she says it has gotten swollen at times to the point where it actually wakes her up at night and feels quite uncomfortable and she has difficulty flexing that finger.    Past medical history, Surgical history, Family history not pertinant except as noted below, Social history, Allergies, and medications have been entered into the medical record, reviewed, and corrections made.   Review of Systems: No fevers, chills, night sweats, weight loss, chest pain, or shortness  of breath.   Objective:    General: Well Developed, well nourished, and in no acute distress.  Neuro: Alert and oriented x3, extra-ocular muscles intact, sensation grossly intact.  HEENT: Normocephalic, atraumatic  Skin: Warm and dry, no rashes. She has what looks like an abrasion on her 4th lateral left finger.  No draingae or open wound.   Cardiac: Regular rate and rhythm, no murmurs rubs or gallops, no lower extremity edema.  Respiratory: Clear to auscultation bilaterally. Not using accessory muscles, speaking in full sentences.   Impression and Recommendations:    Fibromyalgia -stable.  Continue current regimen.  HTN - Well controlled. Continue current regimen. Follow up in  4 months.    IFG - Well controlled.  Much improved on increased dose of metformin.  She is also done a great job and making some dietary changes.  Continue current regimen. Follow up in  4 months.    Pernicious anemia -12 injection given today.  She really is due for another B12 level we can try to obtain this before her next injection.  Rhinitis-okay to increase Atrovent nasal spray to 3 times a day.  Wound left hand-encouraged to discontinue Neosporin.  I explained that it can actually cause a contact dermatitis and prevent wound healing if used for prolonged periods of time.  Recommend that she leave  it uncovered and just apply Vaseline 2-3 times per day and if it is not healing over the next 2 weeks then please let us know.  It really most looks like an abrasion.  Fatigue -labs are up-to-date so likely just from increased pain as well as being on pain medications and she is 2 weeks behind on her B12 injection.

## 2018-08-09 NOTE — Progress Notes (Signed)
Patient here today for Vitamin b12 injection. Patient has no complaints of lethargy, palpitations or problems with medication.  Patient tolerated injection well, injection given in RUOQ without any complications.Mia [name], [name], CMA

## 2018-08-12 DIAGNOSIS — M25552 Pain in left hip: Secondary | ICD-10-CM | POA: Diagnosis not present

## 2018-08-16 DIAGNOSIS — M25552 Pain in left hip: Secondary | ICD-10-CM | POA: Diagnosis not present

## 2018-08-17 NOTE — Progress Notes (Signed)
Prior auth approval for lansoprazole. Approval dates 07/17/18-08/16/19. Approval faxed to [facility].

## 2018-08-17 NOTE — Progress Notes (Signed)
Prior auth approval for lansoprazole. Approval dates 07/17/18-08/16/19. Approval faxed to [facility].

## 2018-08-19 DIAGNOSIS — R350 Frequency of micturition: Secondary | ICD-10-CM | POA: Diagnosis not present

## 2018-08-19 DIAGNOSIS — N3941 Urge incontinence: Secondary | ICD-10-CM | POA: Diagnosis not present

## 2018-08-29 ENCOUNTER — Encounter: Payer: Self-pay | Admitting: Cardiovascular Disease

## 2018-08-29 ENCOUNTER — Ambulatory Visit (INDEPENDENT_AMBULATORY_CARE_PROVIDER_SITE_OTHER): Payer: Medicare Other | Admitting: Cardiovascular Disease

## 2018-08-29 VITALS — BP 140/80 | HR 60 | Ht 66.0 in | Wt 175.8 lb

## 2018-08-29 DIAGNOSIS — I471 Supraventricular tachycardia: Secondary | ICD-10-CM

## 2018-08-29 DIAGNOSIS — M25552 Pain in left hip: Secondary | ICD-10-CM | POA: Diagnosis not present

## 2018-08-29 DIAGNOSIS — I10 Essential (primary) hypertension: Secondary | ICD-10-CM

## 2018-08-29 MED ORDER — FUROSEMIDE 20 MG PO TABS: 20.0000 mg | ORAL_TABLET | Freq: Every day | ORAL | 3 refills | Status: DC

## 2018-08-29 MED ORDER — POTASSIUM CHLORIDE CRYS ER 20 MEQ PO TBCR: 20.0000 meq | EXTENDED_RELEASE_TABLET | Freq: Every day | ORAL | 3 refills | Status: DC

## 2018-08-29 NOTE — Progress Notes (Signed)
Cardiology Office Note   Date:  08/29/2018   ID:  Mia [name], DOB 04/14/1951, MRN [id number]  PCP:  Hali Marry, MD  Cardiologist:   Mertie Moores, MD   Chief Complaint  Patient presents with  . Hypertension   1. SVT 2. Raynaud's Phenomenon 3. Diabetes Mellitus 4. Minimal CAD by cath 2006 [name]) 5. Sleep apnea - CPAP is currently not working  02/11/05 Right heart catheterization:  RA pressure: 8 mmHg  RV pressure: 25/7 mmHg  PA pressure: 20/9 mmHg  PWCP: 9 mmHg  CO: 4.6 liters per minute  LEFT HEART CATHETERIZATION RESULTS:  1. Left main: No significant disease.  2. LAD: Mild luminal irregularities.  3. First and second diagonal: Moderate size, mild luminal irregularities.  4. Left circumflex: Nondominant, mild luminal irregularities.  5. RCA: Dominant with mild luminal irregularities.  6. LV: EF is 55 to 60%, no wall motion abnormalities. LV EDP was 9 mmHg.   December 28, 2014:  Mia [name] is a 67 y.o. female who presents for follow-up with an episode of chest pain. She was seen by our nurse practitioner, Ignacia Bayley.    Diltiazem was DC'd.   She was started on Metoprolol 50 bid.   Myoview study was normal. She has normal left ventricle systolic function. She is going to the [facility] 3 times a week.  Doing well.     She is feeling much better.    Sept. 1, 2016:  Has had C diff for the past several months .  Doing well from a cardiac standpoint .  Has been fatigued.  Doing well with the metoprolol  Still has shortness of breath .   Nov. 3, 2017:    Mia [name] is seen today with her husband, [name].   Last week she started having some heart facing .  HR was 187 (using the app on her phone )  Could feel it in her ears. Got a little dizzy.  Sat down and relaxed and the episode resolved.  Had another episode this past Sunday .  HR stayed elevated for 2 hours   Aug. 24, 2018: Mia [name] is seen today with husband [name].   Followed for HTN and  hyperlipidemia  Has had some elevated diastolic BP readings . Eats some salty foods still - likes ham. Complains of leg pain / ache when she is standing .    Nov. 16, 2018  Mia [name] was having more SVT, we have increased the metoprolol  Up to metoprolol 100 mg BID. She is much better controlled at this point Wearing the event monitor   December 08, 2017:  Mia [name] is seen today for follow-up of her supraventricular tachycardia  We had increased her metoprolol to 100 mg BID  Has occasional palpitations  No CP ,  No severe dyspnea. Does has some DOE that is likely due to generalized deconditioning  Needs left knee replacement this summer. Needs carple tunnel surgery in both wrists.  Has episodes of diaphoresis   Mar 11, 2018: Mia [name] is seen today for follow-up of her supraventricular tachycardia.  We increased her metoprolol up to 150 mill grams twice a day during her last visit. Tolerating the metoprolol well  BP is still is elevated at times.     Eats lots of sandwiches, salami,  Her diet is "terrible"  According to her . - eats snacks instead of real meals, sandwiches,   She is surprised that she hasnt lost any weight .   Nov.  4,, 2019:  Mia [name] is here follow-up with her supraventricular tachycardia and hypertension. Palpitations have improved . BP is still elevated.    Avoids salt for the most part  Still eats Nabs crackers regularly  Past Medical History:  Diagnosis Date  . Anal fissure   . Anemia   . Arthritis   . Blood transfusion   . C. difficile colitis   . Chest pain    a. 01/2013 MV: EF 59%, no ischemia.  . Chronic Dyspnea    a. 01/2013 Echo: EF 60-65%, Gr 1 DD, PASP 39mmHg.  . Diabetes mellitus   . Fibromyalgia   . Gall stones   . GERD (gastroesophageal reflux disease)    gastritis  . Hemorrhoids   . Hypertension   . Interstitial cystitis   . MCI (mild cognitive impairment) 11/25/2017  . Memory difficulty 12/14/2013  . Neuromuscular disorder ([place])    sclerosis    . Osteoporosis   . Peptic ulcer   . PONV (postoperative nausea and vomiting)   . Pseudogout   . Raynaud phenomenon   . Rectal bleeding   . Sleep apnea    a. on cpap.  Marland Kitchen SVT (supraventricular tachycardia) ([place])   . Syncope 09/10/2015  . Thyroid disease    hypothyroidism  . Tremor, essential 05/14/2016    Past Surgical History:  Procedure Laterality Date  . APPENDECTOMY    . BLADDER SURGERY     x2  . BLADDER SURGERY    . BREAST BIOPSY    . CARDIAC CATHETERIZATION    . CARPAL TUNNEL RELEASE    . CATARACT EXTRACTION Bilateral   . CHOLECYSTECTOMY    . DILATION AND CURETTAGE OF UTERUS    . ENTEROCELE REPAIR     x2  . EYE SURGERY     retina  . hysterectomy - unknown type    . JOINT REPLACEMENT    . KNEE ARTHROPLASTY    . KNEE SURGERY     x6  . NECK SURGERY     fusion  . OOPHORECTOMY    . QUADRICEPS REPAIR Right   . RECTOCELE REPAIR     x2  . SHOULDER ARTHROSCOPY WITH SUBACROMIAL DECOMPRESSION, ROTATOR CUFF REPAIR AND BICEP TENDON REPAIR  10/06/2012   Procedure: SHOULDER ARTHROSCOPY WITH SUBACROMIAL DECOMPRESSION, ROTATOR CUFF REPAIR AND BICEP TENDON REPAIR;  Surgeon: Nita Sells, MD;  Location: [facility];  Service: Orthopedics;  Laterality: Right;  Arthroscopic  Repair  of  Subscapularis, Open Biceps Tenodesis  . SHOULDER SURGERY     bilateral- bones spur  . TONSILLECTOMY    . TOTAL KNEE ARTHROPLASTY    . TOTAL SHOULDER ARTHROPLASTY       Current Outpatient Medications  Medication Sig Dispense Refill  . AMBULATORY NON FORMULARY MEDICATION Medication Name: One touch ultra strips. Check fasting blood sugar in the morning and as needed.  Dx - [id number] Fax to [phone] 50 strip 11  . atorvastatin (LIPITOR) 40 MG tablet TAKE 1 TABLET EVERY DAY 90 tablet 2  . calcium carbonate (OS-CAL) 600 MG TABS Take 600 mg by mouth daily with breakfast.     . cetirizine (ZYRTEC) 10 MG tablet Take 10 mg by mouth as needed for allergies.    . Cyanocobalamin  (VITAMIN B-12 IJ) Inject as directed.    . denosumab (PROLIA) 60 MG/ML SOLN injection Inject 60 mg into the skin every 6 (six) months. Administer in upper arm, thigh, or abdomen    . donepezil (ARICEPT) 10 MG tablet  Take 1 tablet (10 mg total) by mouth at bedtime. 90 tablet 3  . fesoterodine (TOVIAZ) 8 MG TB24 tablet Take 8 mg by mouth daily.    . furosemide (LASIX) 20 MG tablet Take 1 tablet (20 mg total) by mouth daily. 90 tablet 3  . gabapentin (NEURONTIN) 300 MG capsule TAKE 1 TO 2 CAPSULES BY MOUTH UP TO TWO TIMES DAILY AS NEEDED 180 capsule 3  . HYDROmorphone (DILAUDID) 2 MG tablet Take 2 mg by mouth daily as needed for moderate pain.   0  . ibuprofen (ADVIL,MOTRIN) 800 MG tablet Take 1 tablet (800 mg total) by mouth daily with supper. 30 tablet 3  . leflunomide (ARAVA) 20 MG tablet Take 1 tablet (20 mg total) by mouth daily. 90 tablet 0  . losartan (COZAAR) 50 MG tablet Take 1 tablet (50 mg total) by mouth daily. 90 tablet 3  . Melatonin 5 MG TABS Take 5 mg by mouth at bedtime as needed.    . memantine (NAMENDA) 10 MG tablet TAKE 1 TABLET (10 MG TOTAL) 2 TIMES DAILY. 180 tablet 4  . metFORMIN (GLUCOPHAGE-XR) 500 MG 24 hr tablet Take 2 tablets (1,000 mg total) by mouth daily with breakfast. 180 tablet 1  . metoprolol tartrate (LOPRESSOR) 100 MG tablet Take 1.5 tablets (150 mg total) by mouth 2 (two) times daily. 270 tablet 3  . NON FORMULARY Place into the nose at bedtime. cpap machine    . ondansetron (ZOFRAN) 4 MG tablet TAKE 2 TABLETS BY MOUTH 2 TIMES A DAY AS NEEDED FOR NAUSEA FOR UP TO 4 DOSES. 20 tablet 0  . ONE TOUCH ULTRA TEST test strip USE TO TEST FASTING BLOOD SUGAR IN THE MORNING AND AS NEEDED 50 each 11  . pantoprazole (PROTONIX) 40 MG tablet Take 40 mg by mouth 2 (two) times daily.    . polyethylene glycol powder (MIRALAX) powder Take 17 g by mouth daily.    . potassium chloride SA (K-DUR,KLOR-CON) 20 MEQ tablet Take 1 tablet (20 mEq total) by mouth daily. 90 tablet 3  .  primidone (MYSOLINE) 50 MG tablet TAKE 1 TABLET AT BEDTIME 90 tablet 1  . Probiotic Product (PROBIOTIC ADVANCED PO) Take 1 capsule by mouth daily.    Marland Kitchen venlafaxine XR (EFFEXOR-XR) 75 MG 24 hr capsule TAKE 1 CAPSULE BY MOUTH  DAILY WITH BREAKFAST 90 capsule 3  . vitamin D, CHOLECALCIFEROL, 400 UNITS tablet Take 400 Units by mouth daily.      No current facility-administered medications for this visit.     Allergies:   Codeine; Myrbetriq  [mirabegron]; Percocet [oxycodone-acetaminophen]; Celebrex [celecoxib]; Darvocet [propoxyphene n-acetaminophen]; Erythromycin; Hydrocodone; Lyrica [pregabalin]; Macrodantin [nitrofurantoin]; Toradol [ketorolac tromethamine]; Tramadol; and Verapamil    Social History:  The patient  reports that she has never smoked. She has never used smokeless tobacco. She reports that she does not drink alcohol or use drugs.   Family History:  The patient's family history includes Asthma in her father, mother, sister, and sister; Breast cancer in her maternal grandmother; Colon polyps in her mother; Dementia in her sister; Diabetes in her mother; Heart attack in her father and mother; Heart disease in her father and mother; Hypertension in her sister; Lupus in her sister; Pancreatic cancer in her maternal grandfather; Rheum arthritis in her sister; Stroke in her mother and sister; Wilson's disease in her maternal grandmother.    ROS: Noted in current history, all other systems are negative.  Physical Exam: Blood pressure 140/80, pulse 60, height 5\' 6"  (  1.676 m), weight 175 lb 12.8 oz (79.7 kg), SpO2 97 %.  GEN:  Well nourished, well developed in no acute distress HEENT: Normal NECK: No JVD; No carotid bruits LYMPHATICS: No lymphadenopathy CARDIAC: RRR , no murmurs, rubs, gallops RESPIRATORY:  Clear to auscultation without rales, wheezing or rhonchi  ABDOMEN: Soft, non-tender, non-distended MUSCULOSKELETAL:  No edema; No deformity  SKIN: Warm and dry NEUROLOGIC:  Alert  and oriented x 3  EKG:      August 29, 2018: Normal sinus rhythm at 60.  Normal EKG.  Recent Labs: 06/09/2018: ALT 26; BUN 13; Creat 0.95; Hemoglobin 14.1; Platelets 376; Potassium 4.6; Sodium 136    Lipid Panel    Component Value Date/Time   CHOL 119 06/18/2017 0951   TRIG 161 (H) 06/18/2017 0951   HDL 40 06/18/2017 0951   CHOLHDL 3.0 06/18/2017 0951   CHOLHDL 3.2 08/28/2016 1558   VLDL 22 08/28/2016 1558   LDLCALC 47 06/18/2017 0951      Wt Readings from Last 3 Encounters:  08/29/18 175 lb 12.8 oz (79.7 kg)  08/09/18 172 lb (78 kg)  06/09/18 175 lb 12.8 oz (79.7 kg)      Other studies Reviewed: Additional studies/ records that were reviewed today include: . Review of the above records demonstrates:    ASSESSMENT AND PLAN:  1. SVT -     she has not had any recent episodes of SVT.  She is on metoprolol 150 mg twice a day.  2.  Hypertension:   Blood pressure is a little elevated.   After much discussion, she informed me that she had run out of her Lasix about 2 months ago   Will refill lasix,   Asked her  To stop eating Nabs crackers.   4. Minimal CAD by cath 2006 [name]) Denies any recent episodes of angina.  5. Sleep apnea -    6. Dyspnea on exertion:     Current medicines are reviewed at length with the patient today.  The patient does not have concerns regarding medicines.  The following changes have been made:  no change  Disposition:   FU with  APP in 6  months     Mertie Moores, MD  08/29/2018 10:33 AM    [facility] [address], [place], [place]  [address] Phone: [phone]; Fax: [phone]

## 2018-08-29 NOTE — Patient Instructions (Signed)
Medication Instructions:  Your physician recommends that you continue on your current medications as directed. Please refer to the Current Medication list given to you today.  If you need a refill on your cardiac medications before your next appointment, please call your pharmacy.    Lab work: None Ordered  If you have labs (blood work) drawn today and your tests are completely normal, you will receive your results only by: . MyChart Message (if you have MyChart) OR . A paper copy in the mail If you have any lab test that is abnormal or we need to change your treatment, we will call you to review the results.   Testing/Procedures: None Ordered   Follow-Up: At [facility], you and your health needs are our priority.  As part of our continuing mission to provide you with exceptional heart care, we have created designated Provider Care Teams.  These Care Teams include your primary Cardiologist (physician) and Advanced Practice Providers (APPs -  Physician Assistants and Nurse Practitioners) who all work together to provide you with the care you need, when you need it. You will need a follow up appointment in:  6 months.  Please call our office 2 months in advance to schedule this appointment.  You may see Philip Nahser, MD or one of the following Advanced Practice Providers on your designated Care Team: Scott Weaver, PA-C Vin Bhagat, PA-C . Janine Hammond, NP   

## 2018-08-29 NOTE — Addendum Note (Signed)
Addended by: Emmaline Life on: 08/29/2018 10:55 AM   Modules accepted: Orders

## 2018-08-30 ENCOUNTER — Other Ambulatory Visit: Payer: Self-pay | Admitting: Family Medicine

## 2018-08-30 DIAGNOSIS — M810 Age-related osteoporosis without current pathological fracture: Secondary | ICD-10-CM

## 2018-08-30 DIAGNOSIS — I10 Essential (primary) hypertension: Secondary | ICD-10-CM

## 2018-08-30 NOTE — Progress Notes (Signed)
Lab order placed. Pt due for Prolia.

## 2018-08-31 ENCOUNTER — Ambulatory Visit: Payer: Medicare Other

## 2018-08-31 DIAGNOSIS — I10 Essential (primary) hypertension: Secondary | ICD-10-CM | POA: Diagnosis not present

## 2018-08-31 DIAGNOSIS — M810 Age-related osteoporosis without current pathological fracture: Secondary | ICD-10-CM | POA: Diagnosis not present

## 2018-09-01 DIAGNOSIS — M6281 Muscle weakness (generalized): Secondary | ICD-10-CM | POA: Diagnosis not present

## 2018-09-01 DIAGNOSIS — M47816 Spondylosis without myelopathy or radiculopathy, lumbar region: Secondary | ICD-10-CM | POA: Diagnosis not present

## 2018-09-01 DIAGNOSIS — M7062 Trochanteric bursitis, left hip: Secondary | ICD-10-CM | POA: Diagnosis not present

## 2018-09-01 LAB — COMPREHENSIVE METABOLIC PANEL
AG Ratio: 1.8 (calc) (ref 1.0–2.5)
ALT: 16 U/L (ref 6–29)
AST: 13 U/L (ref 10–35)
Albumin: 4.2 g/dL (ref 3.6–5.1)
Alkaline phosphatase (APISO): 73 U/L (ref 33–130)
BUN: 13 mg/dL (ref 7–25)
CO2: 30 mmol/L (ref 20–32)
Calcium: 9.6 mg/dL (ref 8.6–10.4)
Chloride: 100 mmol/L (ref 98–110)
Creat: 0.78 mg/dL (ref 0.50–0.99)
Globulin: 2.4 g/dL (calc) (ref 1.9–3.7)
Glucose, Bld: 140 mg/dL — ABNORMAL HIGH (ref 65–99)
Potassium: 4.1 mmol/L (ref 3.5–5.3)
Sodium: 139 mmol/L (ref 135–146)
Total Bilirubin: 0.3 mg/dL (ref 0.2–1.2)
Total Protein: 6.6 g/dL (ref 6.1–8.1)

## 2018-09-01 NOTE — Progress Notes (Signed)
All labs are normal. 

## 2018-09-07 MED ORDER — VITAMIN D2 1,250 MCG (50,000 UNIT) CAPSULE
1250 mcg (50,000 unit) | ORAL_CAPSULE | ORAL | 3 refills | Status: DC
Start: 2018-09-07 — End: 2019-12-14

## 2018-09-08 ENCOUNTER — Other Ambulatory Visit: Payer: Self-pay | Admitting: *Deleted

## 2018-09-08 DIAGNOSIS — Z79899 Other long term (current) drug therapy: Secondary | ICD-10-CM

## 2018-09-08 DIAGNOSIS — R197 Diarrhea, unspecified: Secondary | ICD-10-CM | POA: Diagnosis not present

## 2018-09-08 DIAGNOSIS — K219 Gastro-esophageal reflux disease without esophagitis: Secondary | ICD-10-CM | POA: Diagnosis not present

## 2018-09-08 DIAGNOSIS — R1013 Epigastric pain: Secondary | ICD-10-CM | POA: Diagnosis not present

## 2018-09-08 DIAGNOSIS — N816 Rectocele: Secondary | ICD-10-CM | POA: Diagnosis not present

## 2018-09-08 DIAGNOSIS — K582 Mixed irritable bowel syndrome: Secondary | ICD-10-CM | POA: Diagnosis not present

## 2018-09-08 DIAGNOSIS — K5900 Constipation, unspecified: Secondary | ICD-10-CM | POA: Diagnosis not present

## 2018-09-09 ENCOUNTER — Ambulatory Visit: Payer: Medicare Other | Admitting: Family Medicine

## 2018-09-09 VITALS — BP 107/55 | HR 74

## 2018-09-09 DIAGNOSIS — M6281 Muscle weakness (generalized): Secondary | ICD-10-CM | POA: Diagnosis not present

## 2018-09-09 DIAGNOSIS — D510 Vitamin B12 deficiency anemia due to intrinsic factor deficiency: Secondary | ICD-10-CM

## 2018-09-09 DIAGNOSIS — E538 Deficiency of other specified B group vitamins: Secondary | ICD-10-CM

## 2018-09-09 DIAGNOSIS — M810 Age-related osteoporosis without current pathological fracture: Secondary | ICD-10-CM | POA: Diagnosis not present

## 2018-09-09 DIAGNOSIS — M7062 Trochanteric bursitis, left hip: Secondary | ICD-10-CM | POA: Diagnosis not present

## 2018-09-09 LAB — COMPLETE METABOLIC PANEL WITH GFR
AG Ratio: 1.6 (calc) (ref 1.0–2.5)
ALT: 14 U/L (ref 6–29)
AST: 14 U/L (ref 10–35)
Albumin: 3.7 g/dL (ref 3.6–5.1)
Alkaline phosphatase (APISO): 71 U/L (ref 33–130)
BUN: 11 mg/dL (ref 7–25)
CO2: 32 mmol/L (ref 20–32)
Calcium: 9.6 mg/dL (ref 8.6–10.4)
Chloride: 101 mmol/L (ref 98–110)
Creat: 0.73 mg/dL (ref 0.50–0.99)
GFR, Est African American: 99 mL/min/{1.73_m2} (ref >=60)
GFR, Est Non African American: 85 mL/min/{1.73_m2} (ref >=60)
Globulin: 2.3 g/dL (calc) (ref 1.9–3.7)
Glucose, Bld: 90 mg/dL (ref 65–99)
Potassium: 4.2 mmol/L (ref 3.5–5.3)
Sodium: 140 mmol/L (ref 135–146)
Total Bilirubin: 0.4 mg/dL (ref 0.2–1.2)
Total Protein: 6 g/dL — ABNORMAL LOW (ref 6.1–8.1)

## 2018-09-09 LAB — CBC WITH DIFFERENTIAL/PLATELET
Basophils Absolute: 55 cells/uL (ref 0–200)
Basophils Relative: 0.5 %
Eosinophils Absolute: 120 cells/uL (ref 15–500)
Eosinophils Relative: 1.1 %
HCT: 37.4 % (ref 35.0–45.0)
Hemoglobin: 12.3 g/dL (ref 11.7–15.5)
Lymphs Abs: 1820 cells/uL (ref 850–3900)
MCH: 31.5 pg (ref 27.0–33.0)
MCHC: 32.9 g/dL (ref 32.0–36.0)
MCV: 95.7 fL (ref 80.0–100.0)
MPV: 10.4 fL (ref 7.5–12.5)
Monocytes Relative: 9.7 %
Neutro Abs: 7848 cells/uL — ABNORMAL HIGH (ref 1500–7800)
Neutrophils Relative %: 72 %
Platelets: 352 10*3/uL (ref 140–400)
RBC: 3.91 10*6/uL (ref 3.80–5.10)
RDW: 13.1 % (ref 11.0–15.0)
Total Lymphocyte: 16.7 %
WBC mixed population: 1057 cells/uL — ABNORMAL HIGH (ref 200–950)
WBC: 10.9 10*3/uL — ABNORMAL HIGH (ref 3.8–10.8)

## 2018-09-09 MED ORDER — DENOSUMAB 60 MG/ML ~~LOC~~ SOSY
60.0000 mg | PREFILLED_SYRINGE | Freq: Once | SUBCUTANEOUS | Status: AC
  Administered 2018-09-09: 60 mg via SUBCUTANEOUS

## 2018-09-09 MED ORDER — CYANOCOBALAMIN 1000 MCG/ML IJ SOLN
1000.0000 ug | Freq: Once | INTRAMUSCULAR | Status: AC
  Administered 2018-09-09: 1000 ug via INTRAMUSCULAR

## 2018-09-09 NOTE — Progress Notes (Signed)
Labs are stable.  WBC is mildly elevated.  We will continue to monitor.

## 2018-09-09 NOTE — Progress Notes (Signed)
Agree with documentation as above.   Lissette Schenk, MD  

## 2018-09-09 NOTE — Progress Notes (Signed)
Pt came into clinic today for B12 and Prolia injection. Pt reports no negative side effects from either medication. Denies any chest pain or palpitations. BP was low normal today, she will increase water intake. Tolerated B12 in LUOQ and Prolia in right arm. No immediate complications. Advised to follow up in 1 month for B12. Verbalized understanding.

## 2018-09-12 DIAGNOSIS — M6281 Muscle weakness (generalized): Secondary | ICD-10-CM | POA: Diagnosis not present

## 2018-09-12 DIAGNOSIS — M7062 Trochanteric bursitis, left hip: Secondary | ICD-10-CM | POA: Diagnosis not present

## 2018-09-14 ENCOUNTER — Ambulatory Visit (INDEPENDENT_AMBULATORY_CARE_PROVIDER_SITE_OTHER): Payer: Medicare Other | Admitting: Osteopathic Medicine

## 2018-09-14 ENCOUNTER — Encounter: Payer: Self-pay | Admitting: Osteopathic Medicine

## 2018-09-14 VITALS — BP 105/60 | HR 83 | Temp 98.0°F | Wt 175.0 lb

## 2018-09-14 DIAGNOSIS — J3489 Other specified disorders of nose and nasal sinuses: Secondary | ICD-10-CM | POA: Diagnosis not present

## 2018-09-14 MED ORDER — FLUTICASONE PROPIONATE 50 MCG/ACT NA SUSP: 2.0000 | Freq: Every day | NASAL | 6 refills | Status: DC

## 2018-09-14 NOTE — Patient Instructions (Addendum)
   Would try Claritin-D instead of plain Claritin  Can also take Sudafed one dose early morning instead  Try switching from Ipratropium nasal spray to Flonase or Nasonex steroid spray   Switch out pillows  If this isn't helping, let me know and I can send referral to Ear/Nose/Throat specialist

## 2018-09-14 NOTE — Progress Notes (Signed)
HPI: Mia [name] is a 67 y.o. female who  has a past medical history of Anal fissure, Anemia, Arthritis, Blood transfusion, C. difficile colitis, Chest pain, Chronic Dyspnea, Diabetes mellitus, Fibromyalgia, Gall stones, GERD (gastroesophageal reflux disease), Hemorrhoids, Hypertension, Interstitial cystitis, MCI (mild cognitive impairment) (11/25/2017), Memory difficulty (12/14/2013), Neuromuscular disorder ([place]), Osteoporosis, Peptic ulcer, PONV (postoperative nausea and vomiting), Pseudogout, Raynaud phenomenon, Rectal bleeding, Sleep apnea, SVT (supraventricular tachycardia) ([place]), Syncope (09/10/2015), Thyroid disease, and Tremor, essential (05/14/2016).  she presents to [facility] today, 09/14/18,  for chief complaint of: Sinus headache   Waking up every morning w/ sinus headache then it goes away throughout the day. Yesterday seemed to linger a bit longer. Forgot to mention to Dr Jerilynn Mages last visit but has been ongoing for several months. She takes claritin and atrovent nasal spray. Air filters in home HVAC recently exchanged. Pillows are ? Old.    Reports back pain - following with orthopedics    Past medical, surgical, social and family history reviewed and updated as necessary.   Current medication list and allergy/intolerance information reviewed:    Current Outpatient Medications  Medication Sig Dispense Refill  . AMBULATORY NON FORMULARY MEDICATION Medication Name: One touch ultra strips. Check fasting blood sugar in the morning and as needed.  Dx - E3154 Fax to [phone] 50 strip 11  . atorvastatin (LIPITOR) 40 MG tablet TAKE 1 TABLET EVERY DAY 90 tablet 2  . calcium carbonate (OS-CAL) 600 MG TABS Take 600 mg by mouth daily with breakfast.     . cetirizine (ZYRTEC) 10 MG tablet Take 10 mg by mouth as needed for allergies.    . Cyanocobalamin (VITAMIN B-12 IJ) Inject as directed.    . denosumab (PROLIA) 60 MG/ML SOLN injection Inject 60 mg into  the skin every 6 (six) months. Administer in upper arm, thigh, or abdomen    . donepezil (ARICEPT) 10 MG tablet Take 1 tablet (10 mg total) by mouth at bedtime. 90 tablet 3  . fesoterodine (TOVIAZ) 8 MG TB24 tablet Take 8 mg by mouth daily.    . furosemide (LASIX) 20 MG tablet Take 1 tablet (20 mg total) by mouth daily. 90 tablet 3  . gabapentin (NEURONTIN) 300 MG capsule TAKE 1 TO 2 CAPSULES BY MOUTH UP TO TWO TIMES DAILY AS NEEDED 180 capsule 3  . HYDROmorphone (DILAUDID) 2 MG tablet Take 2 mg by mouth daily as needed for moderate pain.   0  . ibuprofen (ADVIL,MOTRIN) 800 MG tablet Take 1 tablet (800 mg total) by mouth daily with supper. 30 tablet 3  . leflunomide (ARAVA) 20 MG tablet Take 1 tablet (20 mg total) by mouth daily. 90 tablet 0  . losartan (COZAAR) 50 MG tablet Take 1 tablet (50 mg total) by mouth daily. 90 tablet 3  . Melatonin 5 MG TABS Take 5 mg by mouth at bedtime as needed.    . memantine (NAMENDA) 10 MG tablet TAKE 1 TABLET (10 MG TOTAL) 2 TIMES DAILY. 180 tablet 4  . metFORMIN (GLUCOPHAGE-XR) 500 MG 24 hr tablet Take 2 tablets (1,000 mg total) by mouth daily with breakfast. 180 tablet 1  . metoprolol tartrate (LOPRESSOR) 100 MG tablet Take 1.5 tablets (150 mg total) by mouth 2 (two) times daily. 270 tablet 3  . NON FORMULARY Place into the nose at bedtime. cpap machine    . ondansetron (ZOFRAN) 4 MG tablet TAKE 2 TABLETS BY MOUTH 2 TIMES A DAY AS NEEDED FOR NAUSEA FOR  UP TO 4 DOSES. 20 tablet 0  . ONE TOUCH ULTRA TEST test strip USE TO TEST FASTING BLOOD SUGAR IN THE MORNING AND AS NEEDED 50 each 11  . pantoprazole (PROTONIX) 40 MG tablet Take 40 mg by mouth 2 (two) times daily.    . polyethylene glycol powder (MIRALAX) powder Take 17 g by mouth daily.    . potassium chloride SA (K-DUR,KLOR-CON) 20 MEQ tablet Take 1 tablet (20 mEq total) by mouth daily. 90 tablet 3  . primidone (MYSOLINE) 50 MG tablet TAKE 1 TABLET AT BEDTIME 90 tablet 1  . Probiotic Product (PROBIOTIC  ADVANCED PO) Take 1 capsule by mouth daily.    Marland Kitchen venlafaxine XR (EFFEXOR-XR) 75 MG 24 hr capsule TAKE 1 CAPSULE BY MOUTH  DAILY WITH BREAKFAST 90 capsule 3  . vitamin D, CHOLECALCIFEROL, 400 UNITS tablet Take 400 Units by mouth daily.      No current facility-administered medications for this visit.     Allergies  Allergen Reactions  . Codeine Nausea And Vomiting  . Myrbetriq  [Mirabegron] Other (See Comments)    SEVERE HEADACHE  . Percocet [Oxycodone-Acetaminophen] Nausea And Vomiting  . Celebrex [Celecoxib] Other (See Comments)    Other reaction(s): Other flushed  . Darvocet [Propoxyphene N-Acetaminophen] Nausea And Vomiting  . Erythromycin Nausea And Vomiting    Nausea and vomiting   . Hydrocodone Nausea And Vomiting  . Lyrica [Pregabalin] Swelling    Legs swelling   . Macrodantin [Nitrofurantoin] Nausea And Vomiting  . Toradol [Ketorolac Tromethamine] Nausea And Vomiting    Per patient, only PO form causes nausea and vomiting. Can take injection without issue  . Tramadol Nausea And Vomiting  . Verapamil Nausea And Vomiting    REACTION: intolerance      Review of Systems:  Constitutional:  No  fever, no chills, No recent illness, No unintentional weight changes. No significant fatigue.   HEENT: No  headache, no vision change, no hearing change, No sore throat, +sinus pressure  Cardiac: No  chest pain, No  pressure, No palpitations  Respiratory:  No  shortness of breath. No  Cough  Gastrointestinal: No  abdominal pain  Musculoskeletal: No new myalgia/arthralgia  Neurologic: No  weakness, No  dizziness  Exam:  BP 105/60 (BP Location: Left Arm, Patient Position: Sitting, Cuff Size: Normal)   Pulse 83   Temp 98 F (36.7 C) (Oral)   Wt 175 lb (79.4 kg)   BMI 28.25 kg/m   Constitutional: VS see above. General Appearance: alert, well-developed, well-nourished, NAD  Eyes: Normal lids and conjunctive, non-icteric sclera  Ears, Nose, Mouth, Throat: MMM, Normal  external inspection ears/nares/mouth/lips/gums. TM normal bilaterally. Pharynx/tonsils no erythema, no exudate. Nasal mucosa normal.   Neck: No masses, trachea midline. No tenderness/mass appreciated. No lymphadenopathy  Respiratory: Normal respiratory effort. no wheeze, no rhonchi, no rales  Cardiovascular: S1/S2 normal, no murmur, no rub/gallop auscultated. RRR. No lower extremity edema.   Gastrointestinal: Nontender, no masses. Bowel sounds normal.  Musculoskeletal: Gait normal.   Neurological: Normal balance/coordination. No tremor.   Skin: warm, dry, intact.   Psychiatric: Normal judgment/insight. Normal mood and affect.      ASSESSMENT/PLAN: The encounter diagnosis was Sinus pressure.   Meds ordered this encounter  Medications  . fluticasone (FLONASE) 50 MCG/ACT nasal spray    Sig: Place 2 sprays into both nostrils daily.    Dispense:  16 g    Refill:  6      Patient Instructions   Would try Claritin-D instead  of plain Claritin  Can also take Sudafed one dose early morning instead  Try switching from Ipratropium nasal spray to Flonase or Nasonex steroid spray   Switch out pillows  If this isn't helping, let me know and I can send referral to Ear/Nose/Throat specialist      Visit summary with medication list and pertinent instructions was printed for patient to review. All questions at time of visit were answered - patient instructed to contact office with any additional concerns or updates. ER/RTC precautions were reviewed with the patient.   Follow-up plan: Return if symptoms worsen or fail to improve, and as directed by Dr Madilyn Fireman .  Note: Total time spent 25 minutes, greater than 50% of the visit was spent face-to-face counseling and coordinating care for the following: The encounter diagnosis was Sinus pressure..  Please note: voice recognition software was used to produce this document, and typos may escape review. Please contact Dr. Sheppard Coil for  any needed clarifications.

## 2018-09-17 ENCOUNTER — Other Ambulatory Visit: Payer: Self-pay | Admitting: Family Medicine

## 2018-09-19 DIAGNOSIS — M6281 Muscle weakness (generalized): Secondary | ICD-10-CM | POA: Diagnosis not present

## 2018-09-19 DIAGNOSIS — M7062 Trochanteric bursitis, left hip: Secondary | ICD-10-CM | POA: Diagnosis not present

## 2018-09-20 ENCOUNTER — Other Ambulatory Visit: Payer: Self-pay | Admitting: Family Medicine

## 2018-09-20 ENCOUNTER — Other Ambulatory Visit: Payer: Self-pay | Admitting: Physician Assistant

## 2018-09-20 DIAGNOSIS — M47816 Spondylosis without myelopathy or radiculopathy, lumbar region: Secondary | ICD-10-CM | POA: Diagnosis not present

## 2018-09-20 NOTE — Telephone Encounter (Signed)
Last Visit: 06/09/18 Next Visit: 11/07/18 Labs: 09/08/18 stable. WBC is mildly elevated.   Okay to refill per Dr. [name]

## 2018-09-28 DIAGNOSIS — M25552 Pain in left hip: Secondary | ICD-10-CM | POA: Diagnosis not present

## 2018-09-30 DIAGNOSIS — M7062 Trochanteric bursitis, left hip: Secondary | ICD-10-CM | POA: Diagnosis not present

## 2018-09-30 DIAGNOSIS — M6281 Muscle weakness (generalized): Secondary | ICD-10-CM | POA: Diagnosis not present

## 2018-10-04 DIAGNOSIS — M25552 Pain in left hip: Secondary | ICD-10-CM | POA: Diagnosis not present

## 2018-10-06 ENCOUNTER — Other Ambulatory Visit: Payer: Self-pay | Admitting: *Deleted

## 2018-10-06 MED ORDER — LOSARTAN POTASSIUM 50 MG PO TABS: 50.0000 mg | ORAL_TABLET | Freq: Every day | ORAL | 3 refills | Status: DC

## 2018-10-10 ENCOUNTER — Ambulatory Visit (INDEPENDENT_AMBULATORY_CARE_PROVIDER_SITE_OTHER): Payer: Medicare Other | Admitting: Family Medicine

## 2018-10-10 VITALS — BP 112/71 | HR 80 | Temp 98.2°F | Wt 179.0 lb

## 2018-10-10 DIAGNOSIS — M25552 Pain in left hip: Secondary | ICD-10-CM | POA: Diagnosis not present

## 2018-10-10 DIAGNOSIS — E538 Deficiency of other specified B group vitamins: Secondary | ICD-10-CM

## 2018-10-10 MED ORDER — CYANOCOBALAMIN 1000 MCG/ML IJ SOLN
1000.0000 ug | Freq: Once | INTRAMUSCULAR | Status: AC
  Administered 2018-10-10: 1000 ug via INTRAMUSCULAR

## 2018-10-10 NOTE — Progress Notes (Signed)
Agree with documentation as above.   Catherine Metheney, MD  

## 2018-10-10 NOTE — Progress Notes (Signed)
   Subjective:    Patient ID: Mia [name], female    DOB: Mar 29, 1951, 67 y.o.   MRN: [id number]  HPI Patient is here for a Vitamin B12 injection. Denied any gastrointestional problems or dizziness.    Review of Systems     Objective:   Physical Exam        Assessment & Plan:  Patient tolerated injection well without complication. Patient advised to schedule next injection in 4 weeks

## 2018-10-11 DIAGNOSIS — M7062 Trochanteric bursitis, left hip: Secondary | ICD-10-CM | POA: Diagnosis not present

## 2018-10-11 DIAGNOSIS — M25552 Pain in left hip: Secondary | ICD-10-CM | POA: Diagnosis not present

## 2018-10-14 DIAGNOSIS — J301 Allergic rhinitis due to pollen: Secondary | ICD-10-CM | POA: Diagnosis not present

## 2018-10-14 DIAGNOSIS — R51 Headache: Secondary | ICD-10-CM | POA: Diagnosis not present

## 2018-10-14 DIAGNOSIS — J300 Vasomotor rhinitis: Secondary | ICD-10-CM | POA: Diagnosis not present

## 2018-10-20 ENCOUNTER — Other Ambulatory Visit: Payer: Self-pay | Admitting: Family Medicine

## 2018-10-20 ENCOUNTER — Other Ambulatory Visit: Payer: Self-pay | Admitting: Physician Assistant

## 2018-10-24 NOTE — Progress Notes (Deleted)
Office Visit Note  Patient: Mia [name]             Date of Birth: Feb 16, 1951           MRN: [id number]             PCP: Hali Marry, MD Referring: Hali Marry, * Visit Date: 11/07/2018 Occupation: @GUAROCC @  Subjective:  No chief complaint on file.   History of Present Illness: Mia [name] is a 67 y.o. female ***   Activities of Daily Living:  Patient reports morning stiffness for *** {minute/hour:19697}.   Patient {ACTIONS;DENIES/REPORTS:21021675::"Denies"} nocturnal pain.  Difficulty dressing/grooming: {ACTIONS;DENIES/REPORTS:21021675::"Denies"} Difficulty climbing stairs: {ACTIONS;DENIES/REPORTS:21021675::"Denies"} Difficulty getting out of chair: {ACTIONS;DENIES/REPORTS:21021675::"Denies"} Difficulty using hands for taps, buttons, cutlery, and/or writing: {ACTIONS;DENIES/REPORTS:21021675::"Denies"}  No Rheumatology ROS completed.   PMFS History:  Patient Active Problem List   Diagnosis Date Noted  . Facet arthritis of lumbar region 05/10/2018  . Hypokalemia 05/10/2018  . MCI (mild cognitive impairment) 11/25/2017  . Restrictive lung disease 07/30/2017  . Mixed hyperlipidemia 06/18/2017  . Osteoporosis 04/18/2017  . Trochanteric bursitis of both hips 02/24/2017  . Stress fracture of left tibia 11/05/2016  . Inflammatory arthritis 09/30/2016  . High risk medication use 09/30/2016  . History of Clostridium difficile colitis 09/30/2016  . 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 Pseudogout 09/30/2016  . DDD cervical spine status post fusion 09/30/2016  . DDD thoracic spine 09/30/2016  . H/O total knee replacement, right 09/30/2016  . Age-related osteoporosis without current pathological fracture 09/30/2016  . Tremor, essential 05/14/2016  . Right foot pain 04/14/2016  . B12 deficiency 09/10/2015  . Syncope 09/10/2015  . Cervical facet joint syndrome 09/10/2015  . Chronic fatigue 09/10/2015  . Aortic atherosclerosis ([place]) 04/01/2015    . Primary osteoarthritis of left knee 02/27/2015  . DOE (dyspnea on exertion)   . Benign head tremor 06/11/2014  . Lumbar degenerative disc disease 06/11/2014  . IFG (impaired fasting glucose) 03/09/2014  . Osteopenia 03/09/2014  . Hemorrhoid 03/09/2014  . Retinal wrinkling, right eye 03/09/2014  . Fibromyalgia 03/09/2014  . GERD (gastroesophageal reflux disease) 03/09/2014  . History of arthroplasty of right knee 01/31/2014  . Memory difficulty 12/14/2013  . Hemorrhoids, external, thrombosed 06/22/2011  . Hypertension 03/11/2011  . SVT (supraventricular tachycardia) ([place])   . Raynaud phenomenon   . EDEMA 08/25/2010  . Nonspecific (abnormal) findings on radiological and other examination of body structure 08/25/2010  . COMPUTERIZED TOMOGRAPHY, CHEST, ABNORMAL 08/25/2010  . OSA (obstructive sleep apnea) 04/24/2009  . Allergic rhinitis 06/11/2008  . Dyspnea 06/11/2008  . 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 Chronic diastolic CHF (congestive heart failure) ([place]) 06/08/2008  . INTERSTITIAL CYSTITIS 06/08/2008    Past Medical History:  Diagnosis Date  . Anal fissure   . Anemia   . Arthritis   . Blood transfusion   . C. difficile colitis   . Chest pain    a. 01/2013 MV: EF 59%, no ischemia.  . Chronic Dyspnea    a. 01/2013 Echo: EF 60-65%, Gr 1 DD, PASP 12mmHg.  . Diabetes mellitus   . Fibromyalgia   . Gall stones   . GERD (gastroesophageal reflux disease)    gastritis  . Hemorrhoids   . Hypertension   . Interstitial cystitis   . MCI (mild cognitive impairment) 11/25/2017  . Memory difficulty 12/14/2013  . Neuromuscular disorder ([place])    sclerosis  . Osteoporosis   . Peptic ulcer   . PONV (postoperative nausea and vomiting)   .  Pseudogout   . Raynaud phenomenon   . Rectal bleeding   . Sleep apnea    a. on cpap.  Marland Kitchen SVT (supraventricular tachycardia) ([place])   . Syncope 09/10/2015  . Thyroid disease    hypothyroidism  . Tremor, essential 05/14/2016     Family History  Problem Relation Age of Onset  . Heart attack Mother   . Stroke Mother   . Diabetes Mother   . Heart disease Mother   . Colon polyps Mother   . Asthma Mother   . Breast cancer Maternal Grandmother   . Wilson's disease Maternal Grandmother   . Pancreatic cancer Maternal Grandfather   . Heart disease Father   . Heart attack Father   . Asthma Father   . Stroke Sister   . Hypertension Sister   . Rheum arthritis Sister   . Dementia Sister   . Asthma Sister   . Lupus Sister   . Asthma Sister    Past Surgical History:  Procedure Laterality Date  . APPENDECTOMY    . BLADDER SURGERY     x2  . BLADDER SURGERY    . BREAST BIOPSY    . CARDIAC CATHETERIZATION    . CARPAL TUNNEL RELEASE    . CATARACT EXTRACTION Bilateral   . CHOLECYSTECTOMY    . DILATION AND CURETTAGE OF UTERUS    . ENTEROCELE REPAIR     x2  . EYE SURGERY     retina  . hysterectomy - unknown type    . JOINT REPLACEMENT    . KNEE ARTHROPLASTY    . KNEE SURGERY     x6  . NECK SURGERY     fusion  . OOPHORECTOMY    . QUADRICEPS REPAIR Right   . RECTOCELE REPAIR     x2  . SHOULDER ARTHROSCOPY WITH SUBACROMIAL DECOMPRESSION, ROTATOR CUFF REPAIR AND BICEP TENDON REPAIR  10/06/2012   Procedure: SHOULDER ARTHROSCOPY WITH SUBACROMIAL DECOMPRESSION, ROTATOR CUFF REPAIR AND BICEP TENDON REPAIR;  Surgeon: Nita Sells, MD;  Location: [facility];  Service: Orthopedics;  Laterality: Right;  Arthroscopic  Repair  of  Subscapularis, Open Biceps Tenodesis  . SHOULDER SURGERY     bilateral- bones spur  . TONSILLECTOMY    . TOTAL KNEE ARTHROPLASTY    . TOTAL SHOULDER ARTHROPLASTY     Social History   Social History Narrative   Patient drinks about 3-4 cups of caffeine daily.   Patient is right handed.    Objective: Vital Signs: There were no vitals taken for this visit.   Physical Exam   Musculoskeletal Exam: ***  CDAI Exam: CDAI Score: Not documented Patient Global  Assessment: Not documented; Provider Global Assessment: Not documented Swollen: Not documented; Tender: Not documented Joint Exam   Not documented   There is currently no information documented on the homunculus. Go to the Rheumatology activity and complete the homunculus joint exam.  Investigation: No additional findings.  Imaging: No results found.  Recent Labs: Lab Results  Component Value Date   WBC 10.9 (H) 09/08/2018   HGB 12.3 09/08/2018   PLT 352 09/08/2018   NA 140 09/08/2018   K 4.2 09/08/2018   CL 101 09/08/2018   CO2 32 09/08/2018   GLUCOSE 90 09/08/2018   BUN 11 09/08/2018   CREATININE 0.73 09/08/2018   BILITOT 0.4 09/08/2018   ALKPHOS 85 06/04/2017   AST 14 09/08/2018   ALT 14 09/08/2018   PROT 6.0 (L) 09/08/2018   ALBUMIN 4.3 06/04/2017  CALCIUM 9.6 09/08/2018   GFRAA 99 09/08/2018    Speciality Comments: No specialty comments available.  Procedures:  No procedures performed Allergies: Codeine; Myrbetriq  [mirabegron]; Percocet [oxycodone-acetaminophen]; Celebrex [celecoxib]; Darvocet [propoxyphene n-acetaminophen]; Erythromycin; Hydrocodone; Lyrica [pregabalin]; Macrodantin [nitrofurantoin]; Toradol [ketorolac tromethamine]; Tramadol; and Verapamil   Assessment / Plan:     Visit Diagnoses: No diagnosis found.   Orders: No orders of the defined types were placed in this encounter.  No orders of the defined types were placed in this encounter.   Face-to-face time spent with patient was *** minutes. Greater than 50% of time was spent in counseling and coordination of care.  Follow-Up Instructions: No follow-ups on file.   Earnestine Mealing, CMA  Note - This record has been created using Editor, commissioning.  Chart creation errors have been sought, but may not always  have been located. Such creation errors do not reflect on  the standard of medical care.

## 2018-10-28 DIAGNOSIS — M47816 Spondylosis without myelopathy or radiculopathy, lumbar region: Secondary | ICD-10-CM | POA: Diagnosis not present

## 2018-10-28 DIAGNOSIS — M533 Sacrococcygeal disorders, not elsewhere classified: Secondary | ICD-10-CM | POA: Diagnosis not present

## 2018-11-02 DIAGNOSIS — M533 Sacrococcygeal disorders, not elsewhere classified: Secondary | ICD-10-CM | POA: Diagnosis not present

## 2018-11-04 DIAGNOSIS — R350 Frequency of micturition: Secondary | ICD-10-CM | POA: Diagnosis not present

## 2018-11-04 DIAGNOSIS — N3941 Urge incontinence: Secondary | ICD-10-CM | POA: Diagnosis not present

## 2018-11-07 ENCOUNTER — Ambulatory Visit: Payer: Medicare Other | Admitting: Physician Assistant

## 2018-11-08 DIAGNOSIS — M533 Sacrococcygeal disorders, not elsewhere classified: Secondary | ICD-10-CM | POA: Diagnosis not present

## 2018-11-10 ENCOUNTER — Ambulatory Visit: Payer: Medicare Other | Admitting: Family Medicine

## 2018-11-12 ENCOUNTER — Encounter

## 2018-11-14 ENCOUNTER — Ambulatory Visit (INDEPENDENT_AMBULATORY_CARE_PROVIDER_SITE_OTHER): Payer: Medicare Other | Admitting: Family Medicine

## 2018-11-14 ENCOUNTER — Encounter: Payer: Self-pay | Admitting: Family Medicine

## 2018-11-14 VITALS — BP 116/57 | HR 62 | Ht 66.0 in | Wt 180.0 lb

## 2018-11-14 DIAGNOSIS — Z23 Encounter for immunization: Secondary | ICD-10-CM

## 2018-11-14 DIAGNOSIS — R7301 Impaired fasting glucose: Secondary | ICD-10-CM | POA: Diagnosis not present

## 2018-11-14 DIAGNOSIS — R61 Generalized hyperhidrosis: Secondary | ICD-10-CM | POA: Diagnosis not present

## 2018-11-14 DIAGNOSIS — L821 Other seborrheic keratosis: Secondary | ICD-10-CM

## 2018-11-14 DIAGNOSIS — E538 Deficiency of other specified B group vitamins: Secondary | ICD-10-CM

## 2018-11-14 DIAGNOSIS — R232 Flushing: Secondary | ICD-10-CM

## 2018-11-14 LAB — POCT GLYCOSYLATED HEMOGLOBIN (HGB A1C): Hemoglobin A1C: 5.8 % — AB (ref 4.0–5.6)

## 2018-11-14 MED ORDER — LANSOPRAZOLE 30 MG CAP, DELAYED RELEASE
30 mg | ORAL_CAPSULE | ORAL | 3 refills | Status: DC
Start: 2018-11-14 — End: 2020-03-04

## 2018-11-14 MED ORDER — CYANOCOBALAMIN 1000 MCG/ML IJ SOLN
1000.0000 ug | Freq: Once | INTRAMUSCULAR | Status: AC
  Administered 2018-11-14: 1000 ug via INTRAMUSCULAR

## 2018-11-14 NOTE — Patient Instructions (Signed)
Schedule to have your lesions frozen

## 2018-11-14 NOTE — Progress Notes (Signed)
Acute Office Visit  Subjective:    Patient ID: Mia [name], female    DOB: 02/04/1951, 68 y.o.   MRN: [id number]  Chief Complaint  Patient presents with  . Hot Flashes  . B12 Injection  . ifg    HPI Patient is in today for hot flashes and sweats. She says she will get really hot and then start sweating behind her head.  She denies any recent changes to her medications.  No new medicines and no dose adjustments.  She says the sensation will last for about 5 to 10 minutes and then resolves.  It mostly happens during the day but not at any specific time it can be with rest or activity.  Most often it is actually at rest.  It rarely happens at night.  Impaired fasting glucose-no increased thirst or urination. No symptoms consistent with hypoglycemia.  Also like to have her B12 level checked. Last lab was in 2018. She was supposed to go before last injection.    She has a skin lesion on the dorsum of her right hand that she would like me to look at today.  She says she also has similar lesions on her posterior thighs.  They are not painful but sometimes get irritated on her clothing.  Past Medical History:  Diagnosis Date  . Anal fissure   . Anemia   . Arthritis   . Blood transfusion   . C. difficile colitis   . Chest pain    a. 01/2013 MV: EF 59%, no ischemia.  . Chronic Dyspnea    a. 01/2013 Echo: EF 60-65%, Gr 1 DD, PASP 39mmHg.  . Diabetes mellitus   . Fibromyalgia   . Gall stones   . GERD (gastroesophageal reflux disease)    gastritis  . Hemorrhoids   . Hypertension   . Interstitial cystitis   . MCI (mild cognitive impairment) 11/25/2017  . Memory difficulty 12/14/2013  . Neuromuscular disorder ([place])    sclerosis  . Osteoporosis   . Peptic ulcer   . PONV (postoperative nausea and vomiting)   . Pseudogout   . Raynaud phenomenon   . Rectal bleeding   . Sleep apnea    a. on cpap.  Marland Kitchen SVT (supraventricular tachycardia) ([place])   . Syncope 09/10/2015  . Thyroid disease     hypothyroidism  . Tremor, essential 05/14/2016    Past Surgical History:  Procedure Laterality Date  . APPENDECTOMY    . BLADDER SURGERY     x2  . BLADDER SURGERY    . BREAST BIOPSY    . CARDIAC CATHETERIZATION    . CARPAL TUNNEL RELEASE    . CATARACT EXTRACTION Bilateral   . CHOLECYSTECTOMY    . DILATION AND CURETTAGE OF UTERUS    . ENTEROCELE REPAIR     x2  . EYE SURGERY     retina  . hysterectomy - unknown type    . JOINT REPLACEMENT    . KNEE ARTHROPLASTY    . KNEE SURGERY     x6  . NECK SURGERY     fusion  . OOPHORECTOMY    . QUADRICEPS REPAIR Right   . RECTOCELE REPAIR     x2  . SHOULDER ARTHROSCOPY WITH SUBACROMIAL DECOMPRESSION, ROTATOR CUFF REPAIR AND BICEP TENDON REPAIR  10/06/2012   Procedure: SHOULDER ARTHROSCOPY WITH SUBACROMIAL DECOMPRESSION, ROTATOR CUFF REPAIR AND BICEP TENDON REPAIR;  Surgeon: Nita Sells, MD;  Location: [facility];  Service: Orthopedics;  Laterality:  Right;  Arthroscopic  Repair  of  Subscapularis, Open Biceps Tenodesis  . SHOULDER SURGERY     bilateral- bones spur  . TONSILLECTOMY    . TOTAL KNEE ARTHROPLASTY    . TOTAL SHOULDER ARTHROPLASTY      Family History  Problem Relation Age of Onset  . Heart attack Mother   . Stroke Mother   . Diabetes Mother   . Heart disease Mother   . Colon polyps Mother   . Asthma Mother   . Breast cancer Maternal Grandmother   . Wilson's disease Maternal Grandmother   . Pancreatic cancer Maternal Grandfather   . Heart disease Father   . Heart attack Father   . Asthma Father   . Stroke Sister   . Hypertension Sister   . Rheum arthritis Sister   . Dementia Sister   . Asthma Sister   . Lupus Sister   . Asthma Sister     Social History   Socioeconomic History  . Marital status: Married    Spouse name: Not on file  . Number of children: 2  . Years of education: 35  . Highest education level: Not on file  Occupational History  . Occupation: Retired  Photographer  . Financial resource strain: Not on file  . Food insecurity:    Worry: Not on file    Inability: Not on file  . Transportation needs:    Medical: Not on file    Non-medical: Not on file  Tobacco Use  . Smoking status: Never Smoker  . Smokeless tobacco: Never Used  Substance and Sexual Activity  . Alcohol use: No    Alcohol/week: 0.0 standard drinks  . Drug use: No  . Sexual activity: Not on file  Lifestyle  . Physical activity:    Days per week: Not on file    Minutes per session: Not on file  . Stress: Not on file  Relationships  . Social connections:    Talks on phone: Not on file    Gets together: Not on file    Attends religious service: Not on file    Active member of club or organization: Not on file    Attends meetings of clubs or organizations: Not on file    Relationship status: Not on file  . Intimate partner violence:    Fear of current or ex partner: Not on file    Emotionally abused: Not on file    Physically abused: Not on file    Forced sexual activity: Not on file  Other Topics Concern  . Not on file  Social History Narrative   Patient drinks about 3-4 cups of caffeine daily.   Patient is right handed.    Outpatient Medications Prior to Visit  Medication Sig Dispense Refill  . AMBULATORY NON FORMULARY MEDICATION Medication Name: One touch ultra strips. Check fasting blood sugar in the morning and as needed.  Dx - [id number] Fax to [phone] 50 strip 11  . atorvastatin (LIPITOR) 40 MG tablet TAKE 1 TABLET EVERY DAY 90 tablet 2  . calcium carbonate (OS-CAL) 600 MG TABS Take 600 mg by mouth daily with breakfast.     . cetirizine (ZYRTEC) 10 MG tablet Take 10 mg by mouth as needed for allergies.    Marland Kitchen denosumab (PROLIA) 60 MG/ML SOLN injection Inject 60 mg into the skin every 6 (six) months. Administer in upper arm, thigh, or abdomen    . donepezil (ARICEPT) 10 MG tablet Take 1 tablet (  10 mg total) by mouth at bedtime. 90 tablet 3  . fesoterodine  (TOVIAZ) 8 MG TB24 tablet Take 8 mg by mouth daily.    . fluticasone (FLONASE) 50 MCG/ACT nasal spray Place 2 sprays into both nostrils daily. 16 g 6  . furosemide (LASIX) 20 MG tablet Take 1 tablet (20 mg total) by mouth daily. 90 tablet 3  . gabapentin (NEURONTIN) 300 MG capsule TAKE 1 TO 2 CAPSULES BY MOUTH UP TO TWO TIMES DAILY AS NEEDED 180 capsule 3  . HYDROmorphone (DILAUDID) 2 MG tablet Take 2 mg by mouth daily as needed for moderate pain.   0  . ibuprofen (ADVIL,MOTRIN) 800 MG tablet Take 1 tablet (800 mg total) by mouth daily with supper. 30 tablet 3  . leflunomide (ARAVA) 20 MG tablet TAKE 1 TABLET EVERY DAY 90 tablet 0  . losartan (COZAAR) 50 MG tablet Take 1 tablet (50 mg total) by mouth daily. 90 tablet 3  . Melatonin 5 MG TABS Take 5 mg by mouth at bedtime as needed.    . memantine (NAMENDA) 10 MG tablet TAKE 1 TABLET (10 MG TOTAL) 2 TIMES DAILY. 180 tablet 4  . metFORMIN (GLUCOPHAGE-XR) 500 MG 24 hr tablet TAKE 2 TABLETS EVERY DAY WITH BREAKFAST 180 tablet 1  . metoprolol tartrate (LOPRESSOR) 100 MG tablet Take 1.5 tablets (150 mg total) by mouth 2 (two) times daily. 270 tablet 3  . NON FORMULARY Place into the nose at bedtime. cpap machine    . ondansetron (ZOFRAN) 4 MG tablet TAKE 2 TABLETS BY MOUTH 2 TIMES A DAY AS NEEDED FOR NAUSEA FOR UP TO 4 DOSES. 20 tablet 0  . ONE TOUCH ULTRA TEST test strip USE TO TEST FASTING BLOOD SUGAR IN THE MORNING AND AS NEEDED 50 each 11  . pantoprazole (PROTONIX) 40 MG tablet Take 40 mg by mouth 2 (two) times daily.    . polyethylene glycol powder (MIRALAX) powder Take 17 g by mouth daily.    . potassium chloride SA (K-DUR,KLOR-CON) 20 MEQ tablet Take 1 tablet (20 mEq total) by mouth daily. 90 tablet 3  . primidone (MYSOLINE) 50 MG tablet TAKE 1 TABLET AT BEDTIME 90 tablet 0  . Probiotic Product (PROBIOTIC ADVANCED PO) Take 1 capsule by mouth daily.    . ranitidine (ZANTAC) 300 MG tablet Take 300 mg by mouth at bedtime.    Marland Kitchen venlafaxine XR  (EFFEXOR-XR) 75 MG 24 hr capsule TAKE 1 CAPSULE BY MOUTH  DAILY WITH BREAKFAST 90 capsule 3  . vitamin D, CHOLECALCIFEROL, 400 UNITS tablet Take 400 Units by mouth daily.     . Cyanocobalamin (VITAMIN B-12 IJ) Inject as directed.     No facility-administered medications prior to visit.     Allergies  Allergen Reactions  . Codeine Nausea And Vomiting  . Myrbetriq  [Mirabegron] Other (See Comments)    SEVERE HEADACHE  . Percocet [Oxycodone-Acetaminophen] Nausea And Vomiting  . Celebrex [Celecoxib] Other (See Comments)    Other reaction(s): Other flushed  . Darvocet [Propoxyphene N-Acetaminophen] Nausea And Vomiting  . Erythromycin Nausea And Vomiting    Nausea and vomiting   . Hydrocodone Nausea And Vomiting  . Lyrica [Pregabalin] Swelling    Legs swelling   . Macrodantin [Nitrofurantoin] Nausea And Vomiting  . Toradol [Ketorolac Tromethamine] Nausea And Vomiting    Per patient, only PO form causes nausea and vomiting. Can take injection without issue  . Tramadol Nausea And Vomiting  . Verapamil Nausea And Vomiting    REACTION:  intolerance    ROS     Objective:    Physical Exam  BP (!) 116/57   Pulse 62   Ht 5\' 6"  (1.676 m)   Wt 180 lb (81.6 kg)   SpO2 100%   BMI 29.05 kg/m  Wt Readings from Last 3 Encounters:  11/14/18 180 lb (81.6 kg)  10/10/18 179 lb (81.2 kg)  09/14/18 175 lb (79.4 kg)    There are no preventive care reminders to display for this patient.  There are no preventive care reminders to display for this patient.   Lab Results  Component Value Date   TSH 1.10 08/18/2017   Lab Results  Component Value Date   WBC 10.9 (H) 09/08/2018   HGB 12.3 09/08/2018   HCT 37.4 09/08/2018   MCV 95.7 09/08/2018   PLT 352 09/08/2018   Lab Results  Component Value Date   NA 140 09/08/2018   K 4.2 09/08/2018   CO2 32 09/08/2018   GLUCOSE 90 09/08/2018   BUN 11 09/08/2018   CREATININE 0.73 09/08/2018   BILITOT 0.4 09/08/2018   ALKPHOS 85  06/04/2017   AST 14 09/08/2018   ALT 14 09/08/2018   PROT 6.0 (L) 09/08/2018   ALBUMIN 4.3 06/04/2017   CALCIUM 9.6 09/08/2018   GFR 71.66 04/03/2015   Lab Results  Component Value Date   CHOL 119 06/18/2017   Lab Results  Component Value Date   HDL 40 06/18/2017   Lab Results  Component Value Date   LDLCALC 47 06/18/2017   Lab Results  Component Value Date   TRIG 161 (H) 06/18/2017   Lab Results  Component Value Date   CHOLHDL 3.0 06/18/2017   Lab Results  Component Value Date   HGBA1C 5.8 (A) 11/14/2018       Assessment & Plan:   Problem List Items Addressed This Visit      Endocrine   IFG (impaired fasting glucose) - Primary   Relevant Orders   POCT glycosylated hemoglobin (Hb A1C) (Completed)     Other   B12 deficiency   Relevant Medications   cyanocobalamin ((VITAMIN B-12)) injection 1,000 mcg (Completed)   Other Relevant Orders   B12    Other Visit Diagnoses    Need for prophylactic vaccination against Streptococcus pneumoniae (pneumococcus)       Relevant Orders   Pneumococcal polysaccharide vaccine 23-valent greater than or equal to 2yo subcutaneous/IM (Completed)   Hot flashes       Excessive sweating       Relevant Orders   CBC   T26   TSH   Follicle stimulating hormone   Luteinizing hormone   Estradiol   Progesterone   Seborrheic keratosis         B12 deficiency-Will c heck b12 level today before injection today.    Seborrheic keratoses-skin lesions are most consistent with seborrheic keratoses.  Recommend treatment with cryotherapy.  She will schedule an appointment to do so.  Also happy to refer her to dermatology if she prefers.  Excessive sweating/hot flashes - likely hormonal.  Though she is postmenopausal she had a complete hysterectomy in her 65s.  No new medication changes no new adjustments to her medications.  She is not currently on an SSRI.   IFG -controlled.  Hemoglobin A1c of 5.8 today which is fantastic.  Continue to  monitor diet and exercise and follow-up in 6 months.  Given Pneumovax 23 today.  Given.  Meds ordered this encounter  Medications  .  cyanocobalamin ((VITAMIN B-12)) injection 1,000 mcg     [name], MD

## 2018-11-14 NOTE — Progress Notes (Signed)
Patient here today for Vitamin b12 injection. Patient has no complaints of lethargy, palpitations or problems with medication.  Pt tolerated well, injection given in LUOQ w/o any complications.[name], [place]

## 2018-11-14 NOTE — Addendum Note (Signed)
Addended by: Teddy Spike on: 11/14/2018 08:34 AM   Modules accepted: Orders

## 2018-11-15 LAB — CBC
HCT: 40.5 % (ref 35.0–45.0)
Hemoglobin: 13.4 g/dL (ref 11.7–15.5)
MCH: 31.4 pg (ref 27.0–33.0)
MCHC: 33.1 g/dL (ref 32.0–36.0)
MCV: 94.8 fL (ref 80.0–100.0)
MPV: 10.1 fL (ref 7.5–12.5)
Platelets: 457 10*3/uL — ABNORMAL HIGH (ref 140–400)
RBC: 4.27 10*6/uL (ref 3.80–5.10)
RDW: 12.6 % (ref 11.0–15.0)
WBC: 8.8 10*3/uL (ref 3.8–10.8)

## 2018-11-15 LAB — FOLLICLE STIMULATING HORMONE: FSH: 72.7 m[IU]/mL

## 2018-11-15 LAB — TSH: TSH: 1.33 mIU/L (ref 0.40–4.50)

## 2018-11-15 LAB — VITAMIN B12: Vitamin B-12: 366 pg/mL (ref 200–1100)

## 2018-11-15 LAB — ESTRADIOL: Estradiol: <15 pg/mL

## 2018-11-15 LAB — PROGESTERONE: Progesterone: <0.5 ng/mL

## 2018-11-15 LAB — LUTEINIZING HORMONE: LH: 31.3 m[IU]/mL

## 2018-11-16 DIAGNOSIS — M25552 Pain in left hip: Secondary | ICD-10-CM | POA: Diagnosis not present

## 2018-11-21 ENCOUNTER — Other Ambulatory Visit: Payer: Self-pay | Admitting: Family Medicine

## 2018-11-21 ENCOUNTER — Other Ambulatory Visit: Payer: Self-pay | Admitting: Rheumatology

## 2018-11-21 ENCOUNTER — Encounter: Admit: 2018-11-21 | Discharge: 2018-11-21 | Payer: MEDICARE | Primary: Internal Medicine

## 2018-11-21 ENCOUNTER — Inpatient Hospital Stay: Admit: 2018-11-21 | Payer: MEDICARE | Primary: Internal Medicine

## 2018-11-21 DIAGNOSIS — E119 Type 2 diabetes mellitus without complications: Secondary | ICD-10-CM

## 2018-11-22 ENCOUNTER — Encounter: Primary: Internal Medicine

## 2018-11-22 LAB — LIPID PANEL
CHOL/HDL Ratio: 2.8 (ref 0–5.0)
Chol/HDL Ratio: 2.8 (ref 0–5.0)
Cholesterol, Total: 130 MG/DL (ref <200)
Cholesterol, total: 130 MG/DL (ref <200)
HDL Cholesterol: 46 MG/DL (ref 40–60)
HDL: 46 MG/DL (ref 40–60)
LDL Calculated: 61.4 MG/DL (ref 0–100)
LDL, calculated: 61.4 MG/DL (ref 0–100)
Triglyceride: 113 MG/DL (ref <150)
Triglycerides: 113 MG/DL (ref <150)
VLDL Cholesterol Calculated: 22.6 MG/DL
VLDL, calculated: 22.6 MG/DL

## 2018-11-22 LAB — MICROALBUMIN, UR, RAND W/ MICROALB/CREAT RATIO
Creatinine, urine random: 116 mg/dL (ref 30–125)
Microalbumin,urine random: 1.99 MG/DL (ref 0–3.0)
Microalbumin/Creat ratio (mg/g creat): 17 mg/g (ref 0–30)

## 2018-11-22 LAB — HEMOGLOBIN A1C W/O EAG
Hemoglobin A1C: 6 % — ABNORMAL HIGH (ref 4.2–5.6)
Hemoglobin A1c: 6 % — ABNORMAL HIGH (ref 4.2–5.6)

## 2018-11-22 LAB — MICROALBUMIN / CREATININE URINE RATIO
Creatinine, Ur: 116 mg/dL (ref 30–125)
Microalb, Ur: 1.99 MG/DL (ref 0–3.0)
Microalbumin Creatinine Ratio: 17 mg/g (ref 0–30)

## 2018-11-24 NOTE — Progress Notes (Signed)
Office Visit Note  Patient: Mia [name]             Date of Birth: 1951/10/24           MRN: [id number]             PCP: Hali Marry, MD Referring: Hali Marry, * Visit Date: 12/08/2018 Occupation: @GUAROCC @  Subjective:  Fatigue    History of Present Illness: Mia [name] is a 68 y.o. female with history of seronegative rheumatoid arthritis, DDD, osteoarthritis, and fibromyalgia.  She is on Arava 20 mg by mouth daily.  She denies any recent rheumatoid arthritis flares.  She states that [place] has been effective. She denies any joint swelling at this time. She denies any joint pain.  She is have more frequent fibromyalgia flares.  She has generalized muscle aches and muscle tenderness due to fibromyalgia.  She has been having increased fatigue recently.  She has been trying to sleep well at night.  She is been taking gabapentin 300 mg by mouth at bedtime.  She states that she does have access to a pool but has not been exercising recently due to the fatigue she has been experiencing.  She states that she continues to have chronic lower back pain.  She states that she has had an ablation as well as facet joint injections.  She is also had SI joint injections and continues to have pain.  She had an MRI that revealed a bulging disc at L4.  She is been evaluated by a neurosurgeon and they are scheduling an EMG.  She is been having bilateral lower extremity weakness worse on the left side.  She states that the pain has been radiating down her left lower extremity. She has intermittent symptoms of Raynaud's but no ulcerations  Activities of Daily Living:  Patient reports morning stiffness for 30 minutes.   Patient Reports nocturnal pain.  Difficulty dressing/grooming: Denies Difficulty climbing stairs: Reports Difficulty getting out of chair: Reports Difficulty using hands for taps, buttons, cutlery, and/or writing: Denies  Review of Systems  Constitutional: Positive for  fatigue.  HENT: Positive for mouth dryness. Negative for mouth sores and nose dryness.   Eyes: Negative for pain, visual disturbance and dryness.  Respiratory: Positive for cough. Negative for hemoptysis, shortness of breath and difficulty breathing.   Cardiovascular: Negative for chest pain, palpitations, hypertension and swelling in legs/feet.  Gastrointestinal: Positive for constipation. Negative for blood in stool and diarrhea.  Endocrine: Negative for increased urination.  Genitourinary: Negative for painful urination.  Musculoskeletal: Positive for arthralgias, joint pain, joint swelling, myalgias, morning stiffness, muscle tenderness and myalgias. Negative for muscle weakness.  Skin: Negative for color change, pallor, rash, hair loss, nodules/bumps, skin tightness, ulcers and sensitivity to sunlight.  Allergic/Immunologic: Negative for susceptible to infections.  Neurological: Negative for dizziness, numbness, headaches and weakness.  Hematological: Negative for swollen glands.  Psychiatric/Behavioral: Positive for sleep disturbance. Negative for depressed mood. The patient is not nervous/anxious.     PMFS History:  Patient Active Problem List   Diagnosis Date Noted  . Facet arthritis of lumbar region 05/10/2018  . Hypokalemia 05/10/2018  . MCI (mild cognitive impairment) 11/25/2017  . Restrictive lung disease 07/30/2017  . Mixed hyperlipidemia 06/18/2017  . Rectocele 04/26/2017  . Irritable bowel syndrome with both constipation and diarrhea 04/26/2017  . Osteoporosis 04/18/2017  . Trochanteric bursitis of both hips 02/24/2017  . Stress fracture of left tibia 11/05/2016  . Inflammatory arthritis 09/30/2016  .  High risk medication use 09/30/2016  . History of Clostridium difficile colitis 09/30/2016  . 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 Pseudogout 09/30/2016  . DDD cervical spine status post fusion 09/30/2016  . DDD thoracic spine 09/30/2016  . H/O total knee  replacement, right 09/30/2016  . Age-related osteoporosis without current pathological fracture 09/30/2016  . Tremor, essential 05/14/2016  . Right foot pain 04/14/2016  . B12 deficiency 09/10/2015  . Syncope 09/10/2015  . Cervical facet joint syndrome 09/10/2015  . Chronic fatigue 09/10/2015  . Aortic atherosclerosis ([place]) 04/01/2015  . DOE (dyspnea on exertion)   . Primary osteoarthritis of right knee 08/13/2014  . Benign head tremor 06/11/2014  . Lumbar degenerative disc disease 06/11/2014  . IFG (impaired fasting glucose) 03/09/2014  . Osteopenia 03/09/2014  . Hemorrhoid 03/09/2014  . Retinal wrinkling, right eye 03/09/2014  . Fibromyalgia 03/09/2014  . GERD (gastroesophageal reflux disease) 03/09/2014  . History of arthroplasty of right knee 01/31/2014  . Memory difficulty 12/14/2013  . Hemorrhoids, external, thrombosed 06/22/2011  . Hypertension 03/11/2011  . SVT (supraventricular tachycardia) ([place])   . Raynaud phenomenon   . EDEMA 08/25/2010  . Nonspecific (abnormal) findings on radiological and other examination of body structure 08/25/2010  . COMPUTERIZED TOMOGRAPHY, CHEST, ABNORMAL 08/25/2010  . OSA (obstructive sleep apnea) 04/24/2009  . Allergic rhinitis 06/11/2008  . Dyspnea 06/11/2008  . 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 Chronic diastolic CHF (congestive heart failure) ([place]) 06/08/2008  . INTERSTITIAL CYSTITIS 06/08/2008    Past Medical History:  Diagnosis Date  . Anal fissure   . Anemia   . Arthritis   . Blood transfusion   . C. difficile colitis   . Chest pain    a. 01/2013 MV: EF 59%, no ischemia.  . Chronic Dyspnea    a. 01/2013 Echo: EF 60-65%, Gr 1 DD, PASP 52mmHg.  . Diabetes mellitus   . Fibromyalgia   . Gall stones   . GERD (gastroesophageal reflux disease)    gastritis  . Hemorrhoids   . Hypertension   . Interstitial cystitis   . MCI (mild cognitive impairment) 11/25/2017  . Memory difficulty 12/14/2013  . Neuromuscular  disorder ([place])    sclerosis  . Osteoporosis   . Peptic ulcer   . PONV (postoperative nausea and vomiting)   . Pseudogout   . Raynaud phenomenon   . Rectal bleeding   . Sleep apnea    a. on cpap.  Marland Kitchen SVT (supraventricular tachycardia) ([place])   . Syncope 09/10/2015  . Thyroid disease    hypothyroidism  . Tremor, essential 05/14/2016    Family History  Problem Relation Age of Onset  . Heart attack Mother   . Stroke Mother   . Diabetes Mother   . Heart disease Mother   . Colon polyps Mother   . Asthma Mother   . Breast cancer Maternal Grandmother   . Wilson's disease Maternal Grandmother   . Pancreatic cancer Maternal Grandfather   . Heart disease Father   . Heart attack Father   . Asthma Father   . Stroke Sister   . Hypertension Sister   . Rheum arthritis Sister   . Dementia Sister   . Asthma Sister   . Lupus Sister   . Asthma Sister    Past Surgical History:  Procedure Laterality Date  . APPENDECTOMY    . BLADDER SURGERY     x2  . BLADDER SURGERY    . BREAST BIOPSY    . CARDIAC CATHETERIZATION    .  CARPAL TUNNEL RELEASE    . CATARACT EXTRACTION Bilateral   . CHOLECYSTECTOMY    . DILATION AND CURETTAGE OF UTERUS    . ENTEROCELE REPAIR     x2  . EYE SURGERY     retina  . hysterectomy - unknown type    . JOINT REPLACEMENT    . KNEE ARTHROPLASTY    . KNEE SURGERY     x6  . NECK SURGERY     fusion  . OOPHORECTOMY    . QUADRICEPS REPAIR Right   . RECTOCELE REPAIR     x2  . SHOULDER ARTHROSCOPY WITH SUBACROMIAL DECOMPRESSION, ROTATOR CUFF REPAIR AND BICEP TENDON REPAIR  10/06/2012   Procedure: SHOULDER ARTHROSCOPY WITH SUBACROMIAL DECOMPRESSION, ROTATOR CUFF REPAIR AND BICEP TENDON REPAIR;  Surgeon: Nita Sells, MD;  Location: [facility];  Service: Orthopedics;  Laterality: Right;  Arthroscopic  Repair  of  Subscapularis, Open Biceps Tenodesis  . SHOULDER SURGERY     bilateral- bones spur  . TONSILLECTOMY    . TOTAL KNEE  ARTHROPLASTY    . TOTAL SHOULDER ARTHROPLASTY     Social History   Social History Narrative   Patient drinks about 3-4 cups of caffeine daily.   Patient is right handed.   Immunization History  Administered Date(s) Administered  . Influenza Split 07/26/2012  . Influenza Whole 07/30/2009, 08/19/2010  . Influenza, High Dose Seasonal PF 07/06/2016, 07/05/2017  . Influenza,inj,Quad PF,6+ Mos 06/11/2014, 07/25/2015, 06/23/2018  . Influenza-Unspecified 09/05/2013  . Pneumococcal Conjugate-13 01/04/2017  . Pneumococcal Polysaccharide-23 07/30/2009, 11/14/2018  . Tdap 02/13/2008, 05/12/2018  . Zoster 09/01/2011     Objective: Vital Signs: BP 100/70 (BP Location: Left Arm, Patient Position: Sitting, Cuff Size: Normal)   Pulse 75   Resp 13   Ht 5\' 6"  (1.676 m)   Wt 180 lb (81.6 kg)   BMI 29.05 kg/m    Physical Exam Vitals signs and nursing note reviewed.  Constitutional:      Appearance: She is well-developed.  HENT:     Head: Normocephalic and atraumatic.  Eyes:     Conjunctiva/sclera: Conjunctivae normal.  Neck:     Musculoskeletal: Normal range of motion.  Cardiovascular:     Rate and Rhythm: Normal rate and regular rhythm.     Heart sounds: Normal heart sounds.  Pulmonary:     Effort: Pulmonary effort is normal.     Breath sounds: Normal breath sounds.  Abdominal:     General: Bowel sounds are normal.     Palpations: Abdomen is soft.  Lymphadenopathy:     Cervical: No cervical adenopathy.  Skin:    General: Skin is warm and dry.     Capillary Refill: Capillary refill takes less than 2 seconds.  Neurological:     Mental Status: She is alert and oriented to person, place, and time.  Psychiatric:        Behavior: Behavior normal.      Musculoskeletal Exam: C-spine good ROM.  Thoracic and lumbar spine limited ROM with discomfort.  Shoulder joints good ROM with no discomfort.  Left elbow good ROM with no synovitis. Right elbow joint contracture.  Wrist joints, Mcps,  PIPs, DIPs good ROM with no synovitis.  PIP and DIPs synovial thickening.  Hip joints, knee joints,ankle joints, MTPs, PIPs, and DIPs good ROM with no synovitis.  Right knee replacement doing well with no warmth or effusion.  Left knee no warmth or effusion.  No tenderness or swelling of ankle joints.  CDAI Exam: CDAI Score: Not documented Patient Global Assessment: Not documented; Provider Global Assessment: Not documented Swollen: Not documented; Tender: Not documented Joint Exam   Not documented   There is currently no information documented on the homunculus. Go to the Rheumatology activity and complete the homunculus joint exam.  Investigation: No additional findings.  Imaging: No results found.  Recent Labs: Lab Results  Component Value Date   WBC 8.8 11/14/2018   HGB 13.4 11/14/2018   PLT 457 (H) 11/14/2018   NA 140 09/08/2018   K 4.2 09/08/2018   CL 101 09/08/2018   CO2 32 09/08/2018   GLUCOSE 90 09/08/2018   BUN 11 09/08/2018   CREATININE 0.73 09/08/2018   BILITOT 0.4 09/08/2018   ALKPHOS 85 06/04/2017   AST 14 09/08/2018   ALT 14 09/08/2018   PROT 6.0 (L) 09/08/2018   ALBUMIN 4.3 06/04/2017   CALCIUM 9.6 09/08/2018   GFRAA 99 09/08/2018    Speciality Comments: No specialty comments available.  Procedures:  No procedures performed Allergies: Codeine; Myrbetriq  [mirabegron]; Percocet [oxycodone-acetaminophen]; Celebrex [celecoxib]; Darvocet [propoxyphene n-acetaminophen]; Erythromycin; Hydrocodone; Lyrica [pregabalin]; Macrodantin [nitrofurantoin]; Toradol [ketorolac tromethamine]; Tramadol; and Verapamil    Assessment / Plan:      Visit Diagnoses: Seronegative rheumatoid arthritis: She has no synovitis on exam.  Her rheumatoid arthritis is well controlled.  She has not had any recent flares.  She is clinically doing well on Arava 20 mg by mouth daily.  She has no joint pain or joint swelling at this time.  She is been having generalized muscle aches and  muscle tenderness due to fibromyalgia.  She will continue on Arava 20 mg by mouth daily.  She does not need any refills at this time.  She was advised to notify us if she does any increased joint pain or joint swelling.  She will follow-up in the office in 5 months.  High risk medication use - Arava 20 mg po daily.  CBC and CMP are done today to monitor for drug toxicity.  She will return in May and every 3 months for lab work.- Plan: COMPLETE METABOLIC PANEL WITH GFR, CBC with Differential/Platelet  Raynaud's phenomenon without gangrene: She has intermittent symptoms of Raynaud's.  She has no digital ulcerations or signs of gangrene.  We discussed the importance of wearing gloves and thick socks.  We also discussed keeping her core body temperature warm and drinking warm liquids.  She will notify us if she develops any signs or symptoms of gangrene.  Primary osteoarthritis of left knee: No warmth or effusion of knee joints.  Good ROM with no discomfort.   H/O total knee replacement, right: Doing well.  No warmth or effusion of knee joints.    Pseudogout: She has not had any recent flares.   DDD (degenerative disc disease), cervical: She has good range of motion with no discomfort.  No cervical radiculopathy.   DDD (degenerative disc disease), thoracic: No midline spinal tenderness  DDD (degenerative disc disease), lumbar - Dr. Mina Marble, facet jont injections, nerve ablation in the past: Chronic pain.  She had an MRI that revealed a bulging disc at L4.  She has been evaluated by a neurosurgeon who ordered an EMG.  She has been having left-sided radiculopathy.  She is also noticed increased left lower extremity muscle weakness.  Fibromyalgia: She has generalized hyperalgesia and positive tender points on exam.  She is been having more frequent and severe fibromyalgia flares.  She has been having significant fatigue.  She has been sleeping well at night.  She has been taking gabapentin 300 mg by mouth at  bedtime.  We discussed the importance of regular exercise and good sleep hygiene.  I encouraged her to start going to [employer] sne[employer] and the pool for exercise.  We discussed the benefits of water aerobics.  Other osteoporosis without current pathological fracture:She had a Prolia injection every 6 months managed by PCP. Previously treated with Fosamax for unknown duration.  Other medical conditions are listed as follows:   IC (interstitial cystitis)  History of Clostridium difficile colitis  Chronic fatigue  History of gastroesophageal reflux (GERD)  History of CHF (congestive heart failure)  History of hypertension  S/P carpal tunnel release - Left, Dr. Grandville Silos   Orders: Orders Placed This Encounter  Procedures  . COMPLETE METABOLIC PANEL WITH GFR  . CBC with Differential/Platelet   No orders of the defined types were placed in this encounter.   Face-to-face time spent with patient was 30 minutes. Greater than 50% of time was spent in counseling and coordination of care.  Follow-Up Instructions: Return in about 5 months (around 05/08/2019) for Rheumatoid arthritis, Fibromyalgia, DDD, Osteoarthritis.   [name], PA-C   I examined and evaluated the patient with Hazel Sams PA.  Patient had no synovitis on examination.  She complains of pain and discomfort in her bilateral hands.  The discomfort is coming from underlying osteoarthritis.  She has had no pseudogout flare.  She does have some underlying disc disease which causes discomfort.  The plan of care was discussed as noted above.  Bo Merino, MD  Note - This record has been created using Editor, commissioning.  Chart creation errors have been sought, but may not always  have been located. Such creation errors do not reflect on  the standard of medical care.

## 2018-11-29 ENCOUNTER — Ambulatory Visit: Admit: 2018-11-29 | Discharge: 2018-11-29 | Payer: MEDICARE | Attending: Internal Medicine | Primary: Internal Medicine

## 2018-11-29 ENCOUNTER — Ambulatory Visit: Attending: Internal Medicine | Primary: Internal Medicine

## 2018-11-29 DIAGNOSIS — Z0000 Encounter for general adult medical examination without abnormal findings: Secondary | ICD-10-CM

## 2018-11-29 NOTE — Progress Notes (Signed)
This is a subsequent medicare annual welness exam    I have reviewed the patient's medical history in detail and updated the computerized patient record.     History     Past Medical History:   Diagnosis Date   ??? Allergic rhinitis    ??? Anxiety state, unspecified    ??? Arthritis     Dr. Synetta Shadow, Dr. Marisa Hua   ??? Compression fx, thoracic spine [facility]) 2007    negative DEXA Dr. Marisa Hua   ??? Diabetes ([place]) 10/2017    on basis of fbs>125; intol metformin   ??? Dyslipidemia    ??? Dyspepsia    ??? 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 Frozen shoulder     right Dr. Wynonia Hazard MRI   ??? Left thyroid nodule 04/2016    1cm nodule; apparently noted on CT 2008 and unchanged   ??? Multiple lung nodules     no change 10/06, 03/07, 03/08   ??? Overweight (BMI 25.0-29.9)     IF 7/18 start weight 168 lbs not doing    ??? Painless hematuria 04/2016    Dr Jimmye Norman, neg eval   ??? Palpitations 2008    neg thallium 2008, nl holter 2008, echo nl lv/ef 65%/tr mr/dd/nl pasp   ??? Prediabetes    ??? Syncope     neurocardiogenic by tilt 1994   ??? Venous insufficiency    ??? Vitamin D deficiency       Past Surgical History:   Procedure Laterality Date   ??? CARDIAC SURG PROCEDURE UNLIST      thallium 2005 neg; NST 1/17 negative ef 75%   ??? ECHO 2D ADULT  12/04    normal with EF 70%   ??? HX COLONOSCOPY      Dr Rosendo Gros 2007 neg; 04/22/18 neg   ??? HX GYN      s/p BTL    ??? HX HEMORRHOIDECTOMY      Dr. Rosendo Gros 2007   ??? HX HYSTERECTOMY  03/11/2017    Dr Sheral Flow   ??? HX ORTHOPAEDIC      DEXA -0.5 spine, -0.8 hip (1/18)   ??? HX UROLOGICAL  06/2016    Dr Jimmye Norman; bladder bx showed benign lesions   ??? [place] ABD COMP  9/05    negative   ??? VAS CAROTID DUPLEX BILATERAL  2004    negative     Current Outpatient Medications   Medication Sig Dispense Refill   ??? lansoprazole (PREVACID) 30 mg capsule take 1 capsule by mouth BEFORE BREAKFAST DAILY 90 Cap 3    ??? VITAMIN D2 50,000 unit capsule take 1 capsule by mouth every week (Patient taking differently: every thirty (30) days.) 13 Cap 3   ??? atorvastatin (LIPITOR) 40 mg tablet take 1 tablet by mouth once daily 90 Tab 3   ??? SITagliptin (JANUVIA) 100 mg tablet Take 1 Tab by mouth daily. 30 Tab 11   ??? citalopram (CELEXA) 10 mg tablet take 1 tablet by mouth once daily 90 Tab 3   ??? Blood-Glucose Meter monitoring kit Use daily as directed 1 Kit 0   ??? glucose blood VI test strips (BLOOD GLUCOSE TEST) strip Use daily as directed 100 Strip 11   ??? lancets misc Use daily as directed 100 Each 11   ??? ergocalciferol (VITAMIN D2) 50,000 unit capsule Take 50,000 Units by mouth  every thirty (30) days.     ??? omega-3 fatty acids (FISH OIL) cap Take 2 Tabs by mouth daily.     ??? aspirin 81 mg chewable tablet Take 81 mg by mouth daily.       Allergies   Allergen Reactions   ??? Fish Oil Nausea Only   ??? Metformin Diarrhea   ??? Relafen [Nabumetone] Nausea Only     Family History   Problem Relation Age of Onset   ??? Diabetes Mother    ??? Elevated Lipids Father    ??? Stroke Paternal Grandmother    ??? Heart Disease Paternal Grandfather      Social History     Tobacco Use   ??? Smoking status: Never Smoker   ??? Smokeless tobacco: Never Used   Substance Use Topics   ??? Alcohol use: Yes     Alcohol/week: 4.0 standard drinks     Types: 4 Glasses of wine per week     Depression Risk Factor Screening:     3 most recent PHQ Screens 11/29/2018   Little interest or pleasure in doing things Not at all   Feeling down, depressed, irritable, or hopeless Not at all   Total Score PHQ 2 0     SCREENINGS  Colonoscopy last done 2007 Dr Rosendo Gros  Mammogram last done 3/19  DEXA last done 1/18  Gyn last done >5 yrs     Immunization History   Administered Date(s) Administered   ??? Influenza High Dose Vaccine PF 09/07/2016   ??? Influenza Vaccine 07/27/2011, 07/26/2014, 07/27/2015   ??? Influenza Vaccine (Tri) Adjuvanted 08/04/2017, 08/19/2018   ??? Influenza Vaccine PF 08/25/2013    ??? Influenza Vaccine Whole 09/14/2008   ??? Pneumococcal Conjugate (PCV-13) 11/10/2016   ??? Pneumococcal Polysaccharide (PPSV-23) 11/23/2017   ??? TDAP Vaccine 02/09/2011   ??? Zoster Vaccine, Live 03/22/2012     Alcohol Risk Factor Screening:   You do not drink alcohol or very rarely.    Functional Ability and Level of Safety:     Hearing Loss  Hearing is good.    Activities of Daily Living  The home contains: no safety equipment.  Patient does total self care    Fall Risk  Fall Risk Assessment, last 12 mths 11/29/2018   Able to walk? Yes   Fall in past 12 months? No       Abuse Screen  Patient is not abused    Cognitive Screening   Evaluation of Cognitive Function:  Has your family/caregiver stated any concerns about your memory: no  Normal    Patient Care Team   Patient Care Team:  Albin Felling, MD as PCP - General (Internal Medicine)  Albin Felling, MD as PCP - [facility] Empaneled Provider    Assessment/Plan   Education and counseling provided:  Are appropriate based on today's review and evaluation  End-of-Life planning (with patient's consent)  Pneumococcal Vaccine  Influenza Vaccine  Screening Mammography  Colorectal cancer screening tests  Cardiovascular screening blood test  Bone mass measurement (DEXA)  Diabetes screening test    Diagnoses and all orders for this visit:    1. Medicare annual wellness visit, subsequent    2. Controlled type 2 diabetes mellitus without complication, without long-term current use of insulin (HCC)  -     CBC W/O DIFF; Future  -     LIPID PANEL; Future  -     METABOLIC PANEL, COMPREHENSIVE; Future  -     HEMOGLOBIN A1C  W/O EAG; Future    3. Dyslipidemia    4. Overweight (BMI 25.0-29.9)    5. Osteoarthritis, unspecified osteoarthritis type, unspecified site    6. Vitamin D deficiency    7. Advanced directives, counseling/discussion  -     ADVANCE CARE PLANNING FIRST 30 MINS    8. Screening for alcoholism  -     PR ANNUAL ALCOHOL SCREEN 15 MIN    9. Screening for depression   -     [place] Maintenance Due   Topic Date Due   ??? FOOT EXAM Q1  08/29/1961   ??? GLAUCOMA SCREENING Q2Y  08/29/2016   ??? MEDICARE YEARLY EXAM  11/24/2018

## 2018-11-29 NOTE — Patient Instructions (Signed)
Medicare Wellness Visit, Female     The best way to live healthy is to have a lifestyle where you eat a well-balanced diet, exercise regularly, limit alcohol use, and quit all forms of tobacco/nicotine, if applicable.     Regular preventive services are another way to keep healthy. Preventive services (vaccines, screening tests, monitoring & exams) can help personalize your care plan, which helps you manage your own care. Screening tests can find health problems at the earliest stages, when they are easiest to treat.   [facility] follows the current, evidence-based guidelines published by the [place] [employer] (USPSTF) when recommending preventive services for our patients. Because we follow these guidelines, sometimes recommendations change over time as research supports it. (For example, mammograms used to be recommended annually. Even though Medicare will still pay for an annual mammogram, the newer guidelines recommend a mammogram every two years for women of average risk).  Of course, you and your doctor may decide to screen more often for some diseases, based on your risk and your co-morbidities (chronic disease you are already diagnosed with).     Preventive services for you include:  - Medicare offers their members a free annual wellness visit, which is time for you and your primary care provider to discuss and plan for your preventive service needs. Take advantage of this benefit every year!  -All adults over the age of 103 should receive the recommended pneumonia vaccines. Current USPSTF guidelines recommend a series of two vaccines for the best pneumonia protection.   -All adults should have a flu vaccine yearly and a tetanus vaccine every 10 years.   -All adults age 25 and older should receive the shingles vaccines (series of two vaccines).      -All adults age 63-70 who are overweight should have a diabetes screening test once every three years.    -All adults born between [birth year] and 1965 should be screened once for Hepatitis C.  -Other screening tests and preventive services for persons with diabetes include: an eye exam to screen for diabetic retinopathy, a kidney function test, a foot exam, and stricter control over your cholesterol.   -Cardiovascular screening for adults with routine risk involves an electrocardiogram (ECG) at intervals determined by your doctor.   -Colorectal cancer screenings should be done for adults age 43-75 with no increased risk factors for colorectal cancer.  There are a number of acceptable methods of screening for this type of cancer. Each test has its own benefits and drawbacks. Discuss with your doctor what is most appropriate for you during your annual wellness visit. The different tests include: colonoscopy (considered the best screening method), a fecal occult blood test, a fecal DNA test, and sigmoidoscopy.    -A bone mass density test is recommended when a woman turns 65 to screen for osteoporosis. This test is only recommended one time, as a screening. Some providers will use this same test as a disease monitoring tool if you already have osteoporosis.  -Breast cancer screenings are recommended every other year for women of normal risk, age 48-74.  -Cervical cancer screenings for women over age 6 are only recommended with certain risk factors.     Here is a list of your current Health Maintenance items (your personalized list of preventive services) with a due date:  Health Maintenance Due   Topic Date Due   ??? Diabetic Foot Care  08/29/1961   ??? Glaucoma Screening   08/29/2016   ???  Annual Well Visit  11/24/2018

## 2018-11-29 NOTE — Progress Notes (Signed)
68 y.o. white female who presents for f/u    Denied any cardiovascular complaints.  She's trying to be active although no set exercise manly due to motivational issues.  She has occasional edema late in the afternoons which resolves by the next day usually    No gi or gu issues    Denies polyuria, polydipsia, nocturia, vision change.  Doing better with her diet and weight is stable    Vitals 11/29/2018 05/26/2018 02/22/2018 11/23/2017   Weight 164 lb 164 lb 166 lb 166 lb     No sx referable to the thyroid    She's got the beginnings of a cold, sx controlled for now, ongoing maybe 2 days    LAST MEDICARE WELLNESS EXAM: 11/10/16, 11/23/17, 11/29/18    Past Medical History:   Diagnosis Date   ??? Allergic rhinitis    ??? Anxiety state, unspecified    ??? Arthritis     Dr. Synetta Shadow, Dr. Marisa Hua   ??? Compression fx, thoracic spine [facility]) 2007    negative DEXA Dr. Marisa Hua   ??? Diabetes ([place]) 10/2017    on basis of fbs>125; intol metformin   ??? Dyslipidemia    ??? Dyspepsia    ??? 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 Frozen shoulder     right Dr. Wynonia Hazard MRI   ??? Left thyroid nodule 04/2016    1cm nodule; apparently noted on CT 2008 and unchanged   ??? Multiple lung nodules     no change 10/06, 03/07, 03/08   ??? Overweight (BMI 25.0-29.9)     IF 7/18 start weight 168 lbs not doing    ??? Painless hematuria 04/2016    Dr Jimmye Norman, neg eval   ??? Palpitations 2008    neg thallium 2008, nl holter 2008, echo nl lv/ef 65%/tr mr/dd/nl pasp   ??? Prediabetes    ??? Syncope     neurocardiogenic by tilt 1994   ??? Venous insufficiency    ??? Vitamin D deficiency      Past Surgical History:   Procedure Laterality Date   ??? CARDIAC SURG PROCEDURE UNLIST      thallium 2005 neg; NST 1/17 negative ef 75%   ??? ECHO 2D ADULT  12/04    normal with EF 70%   ??? HX COLONOSCOPY      Dr Rosendo Gros 2007 neg; 04/22/18 neg   ??? HX GYN      s/p BTL     ??? HX HEMORRHOIDECTOMY      Dr. Rosendo Gros 2007   ??? HX HYSTERECTOMY  03/11/2017    Dr Sheral Flow   ??? HX ORTHOPAEDIC      DEXA -0.5 spine, -0.8 hip (1/18)   ??? HX UROLOGICAL  06/2016    Dr Jimmye Norman; bladder bx showed benign lesions   ??? [place] ABD COMP  9/05    negative   ??? VAS CAROTID DUPLEX BILATERAL  2004    negative     Social History     Socioeconomic History   ??? Marital status: MARRIED     Spouse name: Not on file   ??? Number of children: 2   ??? Years of education: Not on file   ??? Highest education level: Not on file   Occupational History   ??? Occupation: benefits specialist   Social Needs   ??? Financial resource strain:  Not on file   ??? Food insecurity:     Worry: Not on file     Inability: Not on file   ??? Transportation needs:     Medical: Not on file     Non-medical: Not on file   Tobacco Use   ??? Smoking status: Never Smoker   ??? Smokeless tobacco: Never Used   Substance and Sexual Activity   ??? Alcohol use: Yes     Alcohol/week: 4.0 standard drinks     Types: 4 Glasses of wine per week   ??? Drug use: No   ??? Sexual activity: Not on file   Lifestyle   ??? Physical activity:     Days per week: Not on file     Minutes per session: Not on file   ??? Stress: Not on file   Relationships   ??? Social connections:     Talks on phone: Not on file     Gets together: Not on file     Attends religious service: Not on file     Active member of club or organization: Not on file     Attends meetings of clubs or organizations: Not on file     Relationship status: Not on file   ??? Intimate partner violence:     Fear of current or ex partner: Not on file     Emotionally abused: Not on file     Physically abused: Not on file     Forced sexual activity: Not on file   Other Topics Concern   ??? Not on file   Social History Narrative   ??? Not on file     Current Outpatient Medications   Medication Sig   ??? lansoprazole (PREVACID) 30 mg capsule take 1 capsule by mouth BEFORE BREAKFAST DAILY   ??? VITAMIN D2 50,000 unit capsule take 1 capsule by mouth every week  (Patient taking differently: every thirty (30) days.)   ??? atorvastatin (LIPITOR) 40 mg tablet take 1 tablet by mouth once daily   ??? SITagliptin (JANUVIA) 100 mg tablet Take 1 Tab by mouth daily.   ??? citalopram (CELEXA) 10 mg tablet take 1 tablet by mouth once daily   ??? Blood-Glucose Meter monitoring kit Use daily as directed   ??? glucose blood VI test strips (BLOOD GLUCOSE TEST) strip Use daily as directed   ??? lancets misc Use daily as directed   ??? ergocalciferol (VITAMIN D2) 50,000 unit capsule Take 50,000 Units by mouth every thirty (30) days.   ??? omega-3 fatty acids (FISH OIL) cap Take 2 Tabs by mouth daily.   ??? aspirin 81 mg chewable tablet Take 81 mg by mouth daily.     No current facility-administered medications for this visit.      Allergies   Allergen Reactions   ??? Fish Oil Nausea Only   ??? Metformin Diarrhea   ??? Relafen [Nabumetone] Nausea Only     REVIEW OF SYSTEMS: gyn 4/18 Dr Andrew Au, mammo 3/19, colo 6/19 Dr Rosendo Gros, DEXA 1/18 Dr Margaretann Loveless ??? no vision change or eye pain  Oral ??? no mouth pain, tongue or tooth problems  Ears ??? no hearing loss, ear pain, fullness, no swallowing problems  Cardiac ??? no CP, PND, orthopnea, edema, palpitations or syncope  Chest ??? no breast masses  Resp ??? no wheezing, chronic coughing, dyspnea  Urinary ??? no dysuria, hematuria, flank pain, urgency, frequency    Visit Vitals  BP 114/72 (BP 1 Location: Left  arm, BP Patient Position: Sitting)   Pulse 86   Temp 98.7 ??F (37.1 ??C) (Oral)   Resp 16   Ht '5\' 4"'  (1.626 m)   Wt 164 lb (74.4 kg)   SpO2 97%   BMI 28.15 kg/m??   Affect is appropriate.  Mood stable  No apparent distress  HEENT --Anicteric sclerae.  No JVD, or bruits.  Thyroid fullness on left  Lungs --Clear to auscultation and percussion, normal percussion.  Heart --Regular rate and rhythm, no murmurs, rubs, gallops, or clicks.  Abdomen -- Soft and nontender, no hepatosplenomegaly or masses.  Extremities -- Without cyanosis, clubbing, edema. 2+ pulses equally and  bilaterally.  Varicose veins bilat, trace pedal edema symmetrically    LABS  From 5/10 showed   gluc 110, cr 0.70,               alt 23,                                    chol 154, tg 143, hdl 45, ldl-c 80,                                                            tsh 1.91  From 5/10 showed                      2 hr GTT 89  From 8/10 showed                                                               vit d 23.0, ck 57, aldo 5.4  From 8/11 showed   gluc 108,                                     hba1c 6.3,                   chol 160, tg 172, hdl 39, ldl-c 87,  wbc 5.,7 hb 12.3, plt 235, ua neg,     tsh 2.28  From 5/12 showed   gluc 113, cr 0.71, gfr 94,  alt 16, hba1c 6.2, ldl-p 1989, chol 177, tg 161, hdl 43, ldl-c 102  From 11/12 showed                                                     hba1c 5.9, ldl-p 1218, chol 133, tg 116, hdl 45, ldl-c 65  From 5/13 showed   gluc 105, cr 0.55, gfr 102, alt 8,  hba1c 6.2,                   chol 148, tg 107, hdl 45, ldl-c 82,  wbc 5.0, hb 12.5, plt 172, vit d 40.3  From 11/13 showed  hba1c 6.2, ldl-p 1888, chol 176, tg 155, hdl 49, ldl-c 86,  wbc 6.2, hb 12.3, plt 182, vit d 34.4  From 5/14 showed        hba1c 6.2,     chol 152, tg 157, hdl 39, ldl-c 82  From 1/15 showed   gluc 113, cr 0.68, gfr>60, alt 11, hba1c 6.2,     chol 146, tg 130, hdl 43, ldl-c 77  From 7/15 showed        hba1c 6.3,     chol 133, tg 124, hdl 39, ldl-c 69, wbc 6.3, hb 12.2, plt 193  From 6/16 showed   gluc 106, cr 0.74, gfr>60, alt 26, hba1c 6.3,     chol 126, tg 125, hdl 46, ldl-c 55, wbc 5.7, hb 13.2, plt 203, vit d 7.2,   tsh 1.69  From 12/16 showed gluc 110, cr 0.68, gfr>60, alt 30, hba1c 6.1,     chol 135, tg 147, hdl 47, ldl-c 59, wbc 6.0, hb 12.7, plt 197  From 6/17 showed   gluc 122, cr 0.71, gfr>60, alt 33, hba1c 6.2,     chol 153, tg 140, hdl 46, ldl-c 79, wbc 6.3, hb 13.1, plt 190,              tsh 1.92, hep c neg   From 1/18 showed        hba1c 6.2,    chol 154, tg 181, hdl 41, ldl-c 77  From 7/18 showed   gluc 129, cr 0.63, gfr>60, alt 26, hba1c 6.1,              wbc 6.0, hb 12.4, plt 193, vit d 79.0  From 1/19 showed   gluc 132, cr 0.83, gfr>60,    hba1c 6.1  From 4/19 showed        hba1c 7.0, umar 12.0, chol 124, tg 109, hdl 46, ldl-c 56,               vit d 56.1  From 7/19 showed  gluc 106, cr 0.67, gfr>60, alt 25, hba1c 6.0,               wbc 5.8, hb 13.0, plt 183    Results for orders placed or performed during the hospital encounter of 11/21/18   LIPID PANEL   Result Value Ref Range    LIPID PROFILE          Cholesterol, total 130 <200 MG/DL    Triglyceride 113 <150 MG/DL    HDL Cholesterol 46 40 - 60 MG/DL    LDL, calculated 61.4 0 - 100 MG/DL    VLDL, calculated 22.6 MG/DL    CHOL/HDL Ratio 2.8 0 - 5.0     HEMOGLOBIN A1C W/O EAG   Result Value Ref Range    Hemoglobin A1c 6.0 (H) 4.2 - 5.6 %   MICROALBUMIN, UR, RAND W/ MICROALB/CREAT RATIO   Result Value Ref Range    Microalbumin,urine random 1.99 0 - 3.0 MG/DL    Creatinine, urine 116.00 30 - 125 mg/dL    Microalbumin/Creat ratio (mg/g creat) 17 0 - 30 mg/g     Patient Active Problem List   Diagnosis Code   ??? Vitamin D deficiency E55.9   ??? Anxiety F41.9   ??? Dyslipidemia E78.5   ??? 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 Overweight (BMI 25.0-29.9) E66.3   ??? Controlled type 2 diabetes mellitus, without long-term current use of insulin (HCC) E11.9     Assessment  and plan:  1. DM.  Continue current regimen.  F/u ophth  2. Hyperlipidemia. Continue current.  We did talk about recent vascepa developments, will try to get covered at the next visit  3. Hypovitaminosis D. Continue current regimen.  4. Anxiety.  Continue celexa  5. Overweight.  Lifestyle and dietary measures.  Portion control reiterated.  6. Endometrial ca.  Per Dr Sheral Flow  7. Edema.  Cut out salt, inc activities, declined prn diuretic for now        RTC 8/20     Above conditions discussed at length and patient vocalized understanding.  All questions answered to patient satisfaction

## 2018-11-29 NOTE — Progress Notes (Signed)
Laura Roman presents today for   Chief Complaint   Patient presents with   ??? Physical     labs done              Depression Screening:  3 most recent PHQ Screens 11/29/2018   Little interest or pleasure in doing things Not at all   Feeling down, depressed, irritable, or hopeless Not at all   Total Score PHQ 2 0       Learning Assessment:  Learning Assessment 07/17/2015   PRIMARY LEARNER Patient   HIGHEST LEVEL OF EDUCATION - PRIMARY LEARNER  GRADUATED HIGH SCHOOL OR GED   BARRIERS PRIMARY LEARNER -   CO-LEARNER CAREGIVER -   PRIMARY LANGUAGE ENGLISH   LEARNER PREFERENCE PRIMARY READING     LISTENING     DEMONSTRATION   ANSWERED BY patient Laura Roman   RELATIONSHIP SELF       Abuse Screening:  Abuse Screening Questionnaire 11/29/2018   Do you ever feel afraid of your partner? N   Are you in a relationship with someone who physically or mentally threatens you? N   Is it safe for you to go home? Y       Fall Risk  Fall Risk Assessment, last 12 mths 11/29/2018   Able to walk? Yes   Fall in past 12 months? No           Coordination of Care:  1. Have you been to the ER, urgent care clinic since your last visit?   Hospitalized since your last visit? no    2. Have you seen or consulted any other health care providers outside of the [facility] since your last visit?Include any pap smears or colon screening. no

## 2018-11-29 NOTE — ACP (Advance Care Planning) (Signed)
Advance Care Planning    Advance Care Planning (ACP) Provider Note - Comprehensive     Date of ACP Conversation: 11/29/18  Persons included in Conversation:  patient  Length of ACP Conversation in minutes:  16 minutes    Authorized Media planner (if patient is incapable of making informed decisions):   This person is: husband  Other Conservation officer, nature (e.g. Next of Kin)          General ACP for ALL Patients with Decision Making Capacity:   Importance of advance care planning, including choosing a healthcare agent to communicate patient's healthcare decisions if patient lost the ability to make decisions, such as after a sudden illness or accident  Understanding of the healthcare agent role was assessed and information provided    Review of Existing Advance Directive:  Will bring in copy of amd    For Serious or Chronic Illness:  Understanding of medical condition      Interventions Provided:  Recommended completion of Advance Directive form after review of ACP materials and conversation with prospective healthcare agent   Recommended communicating the plan and making copies for the healthcare agent, personal physician, and others as appropriate (e.g., health system)  Recommended review of completed ACP document annually or upon change in health status

## 2018-11-29 NOTE — Progress Notes (Signed)
This is a subsequent medicare annual welness exam    I have reviewed the patient's medical history in detail and updated the computerized patient record.     History     Past Medical History:   Diagnosis Date   ??? Allergic rhinitis    ??? Anxiety state, unspecified    ??? Arthritis     Dr. Synetta Shadow, Dr. Marisa Hua   ??? Compression fx, thoracic spine [facility]) 2007    negative DEXA Dr. Marisa Hua   ??? Diabetes ([place]) 10/2017    on basis of fbs>125; intol metformin   ??? Dyslipidemia    ??? Dyspepsia    ??? 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 Frozen shoulder     right Dr. Wynonia Hazard MRI   ??? Left thyroid nodule 04/2016    1cm nodule; apparently noted on CT 2008 and unchanged   ??? Multiple lung nodules     no change 10/06, 03/07, 03/08   ??? Overweight (BMI 25.0-29.9)     IF 7/18 start weight 168 lbs not doing    ??? Painless hematuria 04/2016    Dr Jimmye Norman, neg eval   ??? Palpitations 2008    neg thallium 2008, nl holter 2008, echo nl lv/ef 65%/tr mr/dd/nl pasp   ??? Prediabetes    ??? Syncope     neurocardiogenic by tilt 1994   ??? Venous insufficiency    ??? Vitamin D deficiency       Past Surgical History:   Procedure Laterality Date   ??? CARDIAC SURG PROCEDURE UNLIST      thallium 2005 neg; NST 1/17 negative ef 75%   ??? ECHO 2D ADULT  12/04    normal with EF 70%   ??? HX COLONOSCOPY      Dr Rosendo Gros 2007 neg; 04/22/18 neg   ??? HX GYN      s/p BTL    ??? HX HEMORRHOIDECTOMY      Dr. Rosendo Gros 2007   ??? HX HYSTERECTOMY  03/11/2017    Dr Sheral Flow   ??? HX ORTHOPAEDIC      DEXA -0.5 spine, -0.8 hip (1/18)   ??? HX UROLOGICAL  06/2016    Dr Jimmye Norman; bladder bx showed benign lesions   ??? [place] ABD COMP  9/05    negative   ??? VAS CAROTID DUPLEX BILATERAL  2004    negative     Current Outpatient Medications   Medication Sig Dispense Refill   ??? lansoprazole (PREVACID) 30 mg capsule take 1 capsule by mouth BEFORE BREAKFAST DAILY 90 Cap 3   ??? VITAMIN  D2 50,000 unit capsule take 1 capsule by mouth every week (Patient taking differently: every thirty (30) days.) 13 Cap 3   ??? atorvastatin (LIPITOR) 40 mg tablet take 1 tablet by mouth once daily 90 Tab 3   ??? SITagliptin (JANUVIA) 100 mg tablet Take 1 Tab by mouth daily. 30 Tab 11   ??? citalopram (CELEXA) 10 mg tablet take 1 tablet by mouth once daily 90 Tab 3   ??? Blood-Glucose Meter monitoring kit Use daily as directed 1 Kit 0   ??? glucose blood VI test strips (BLOOD GLUCOSE TEST) strip Use daily as directed 100 Strip 11   ??? lancets misc Use daily as directed 100 Each 11   ??? ergocalciferol (VITAMIN D2) 50,000 unit capsule Take 50,000 Units by mouth  every thirty (30) days.     ??? omega-3 fatty acids (FISH OIL) cap Take 2 Tabs by mouth daily.     ??? aspirin 81 mg chewable tablet Take 81 mg by mouth daily.       Allergies   Allergen Reactions   ??? Fish Oil Nausea Only   ??? Metformin Diarrhea   ??? Relafen [Nabumetone] Nausea Only     Family History   Problem Relation Age of Onset   ??? Diabetes Mother    ??? Elevated Lipids Father    ??? Stroke Paternal Grandmother    ??? Heart Disease Paternal Grandfather      Social History     Tobacco Use   ??? Smoking status: Never Smoker   ??? Smokeless tobacco: Never Used   Substance Use Topics   ??? Alcohol use: Yes     Alcohol/week: 4.0 standard drinks     Types: 4 Glasses of wine per week     Depression Risk Factor Screening:     3 most recent PHQ Screens 11/29/2018   Little interest or pleasure in doing things Not at all   Feeling down, depressed, irritable, or hopeless Not at all   Total Score PHQ 2 0     SCREENINGS  Colonoscopy last done 2007 Dr Rosendo Gros  Mammogram last done 3/19  DEXA last done 1/18  Gyn last done >5 yrs     Immunization History   Administered Date(s) Administered   ??? Influenza High Dose Vaccine PF 09/07/2016   ??? Influenza Vaccine 07/27/2011, 07/26/2014, 07/27/2015   ??? Influenza Vaccine (Tri) Adjuvanted 08/04/2017, 08/19/2018   ??? Influenza Vaccine PF 08/25/2013   ??? Influenza  Vaccine Whole 09/14/2008   ??? Pneumococcal Conjugate (PCV-13) 11/10/2016   ??? Pneumococcal Polysaccharide (PPSV-23) 11/23/2017   ??? TDAP Vaccine 02/09/2011   ??? Zoster Vaccine, Live 03/22/2012     Alcohol Risk Factor Screening:   You do not drink alcohol or very rarely.    Functional Ability and Level of Safety:     Hearing Loss  Hearing is good.    Activities of Daily Living  The home contains: no safety equipment.  Patient does total self care    Fall Risk  Fall Risk Assessment, last 12 mths 11/29/2018   Able to walk? Yes   Fall in past 12 months? No       Abuse Screen  Patient is not abused    Cognitive Screening   Evaluation of Cognitive Function:  Has your family/caregiver stated any concerns about your memory: no  Normal    Patient Care Team   Patient Care Team:  Albin Felling, MD as PCP - General (Internal Medicine)  Albin Felling, MD as PCP - [facility] Empaneled Provider    Assessment/Plan   Education and counseling provided:  Are appropriate based on today's review and evaluation  End-of-Life planning (with patient's consent)  Pneumococcal Vaccine  Influenza Vaccine  Screening Mammography  Colorectal cancer screening tests  Cardiovascular screening blood test  Bone mass measurement (DEXA)  Diabetes screening test    Diagnoses and all orders for this visit:    1. Medicare annual wellness visit, subsequent    2. Controlled type 2 diabetes mellitus without complication, without long-term current use of insulin (HCC)  -     CBC W/O DIFF; Future  -     LIPID PANEL; Future  -     METABOLIC PANEL, COMPREHENSIVE; Future  -     HEMOGLOBIN A1C  W/O EAG; Future    3. Dyslipidemia    4. Overweight (BMI 25.0-29.9)    5. Osteoarthritis, unspecified osteoarthritis type, unspecified site    6. Vitamin D deficiency    7. Advanced directives, counseling/discussion  -     ADVANCE CARE PLANNING FIRST 30 MINS    8. Screening for alcoholism  -     PR ANNUAL ALCOHOL SCREEN 15 MIN    9. Screening for depression  -     [place] Maintenance Due   Topic Date Due   ??? FOOT EXAM Q1  08/29/1961   ??? GLAUCOMA SCREENING Q2Y  08/29/2016   ??? MEDICARE YEARLY EXAM  11/24/2018

## 2018-11-29 NOTE — Progress Notes (Signed)
 Laura Roman presents today for   Chief Complaint   Patient presents with   . Physical     labs done              Depression Screening:  3 most recent PHQ Screens 11/29/2018   Little interest or pleasure in doing things Not at all   Feeling down, depressed, irritable, or hopeless Not at all   Total Score PHQ 2 0       Learning Assessment:  Learning Assessment 07/17/2015   PRIMARY LEARNER Patient   HIGHEST LEVEL OF EDUCATION - PRIMARY LEARNER  GRADUATED HIGH SCHOOL OR GED   BARRIERS PRIMARY LEARNER -   CO-LEARNER CAREGIVER -   PRIMARY LANGUAGE ENGLISH   LEARNER PREFERENCE PRIMARY READING     LISTENING     DEMONSTRATION   ANSWERED BY patient Laura Roman   RELATIONSHIP SELF       Abuse Screening:  Abuse Screening Questionnaire 11/29/2018   Do you ever feel afraid of your partner? N   Are you in a relationship with someone who physically or mentally threatens you? N   Is it safe for you to go home? Y       Fall Risk  Fall Risk Assessment, last 12 mths 11/29/2018   Able to walk? Yes   Fall in past 12 months? No           Coordination of Care:  1. Have you been to the ER, urgent care clinic since your last visit?   Hospitalized since your last visit? no    2. Have you seen or consulted any other health care providers outside of the [facility] since your last visit?Include any pap smears or colon screening. no

## 2018-11-29 NOTE — Progress Notes (Signed)
68 y.o. white female who presents for f/u    Denied any cardiovascular complaints.  She's trying to be active although no set exercise manly due to motivational issues.  She has occasional edema late in the afternoons which resolves by the next day usually    No gi or gu issues    Denies polyuria, polydipsia, nocturia, vision change.  Doing better with her diet and weight is stable    Vitals 11/29/2018 05/26/2018 02/22/2018 11/23/2017   Weight 164 lb 164 lb 166 lb 166 lb     No sx referable to the thyroid    She's got the beginnings of a cold, sx controlled for now, ongoing maybe 2 days    LAST MEDICARE WELLNESS EXAM: 11/10/16, 11/23/17, 11/29/18    Past Medical History:   Diagnosis Date   ??? Allergic rhinitis    ??? Anxiety state, unspecified    ??? Arthritis     Dr. Synetta Shadow, Dr. Marisa Hua   ??? Compression fx, thoracic spine [facility]) 2007    negative DEXA Dr. Marisa Hua   ??? Diabetes ([place]) 10/2017    on basis of fbs>125; intol metformin   ??? Dyslipidemia    ??? Dyspepsia    ??? 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 Frozen shoulder     right Dr. Wynonia Hazard MRI   ??? Left thyroid nodule 04/2016    1cm nodule; apparently noted on CT 2008 and unchanged   ??? Multiple lung nodules     no change 10/06, 03/07, 03/08   ??? Overweight (BMI 25.0-29.9)     IF 7/18 start weight 168 lbs not doing    ??? Painless hematuria 04/2016    Dr Jimmye Norman, neg eval   ??? Palpitations 2008    neg thallium 2008, nl holter 2008, echo nl lv/ef 65%/tr mr/dd/nl pasp   ??? Prediabetes    ??? Syncope     neurocardiogenic by tilt 1994   ??? Venous insufficiency    ??? Vitamin D deficiency      Past Surgical History:   Procedure Laterality Date   ??? CARDIAC SURG PROCEDURE UNLIST      thallium 2005 neg; NST 1/17 negative ef 75%   ??? ECHO 2D ADULT  12/04    normal with EF 70%   ??? HX COLONOSCOPY      Dr Rosendo Gros 2007 neg; 04/22/18 neg   ??? HX GYN      s/p BTL    ??? HX  HEMORRHOIDECTOMY      Dr. Rosendo Gros 2007   ??? HX HYSTERECTOMY  03/11/2017    Dr Sheral Flow   ??? HX ORTHOPAEDIC      DEXA -0.5 spine, -0.8 hip (1/18)   ??? HX UROLOGICAL  06/2016    Dr Jimmye Norman; bladder bx showed benign lesions   ??? [place] ABD COMP  9/05    negative   ??? VAS CAROTID DUPLEX BILATERAL  2004    negative     Social History     Socioeconomic History   ??? Marital status: MARRIED     Spouse name: Not on file   ??? Number of children: 2   ??? Years of education: Not on file   ??? Highest education level: Not on file   Occupational History   ??? Occupation: benefits specialist   Social Needs   ??? Financial resource strain:  Not on file   ??? Food insecurity:     Worry: Not on file     Inability: Not on file   ??? Transportation needs:     Medical: Not on file     Non-medical: Not on file   Tobacco Use   ??? Smoking status: Never Smoker   ??? Smokeless tobacco: Never Used   Substance and Sexual Activity   ??? Alcohol use: Yes     Alcohol/week: 4.0 standard drinks     Types: 4 Glasses of wine per week   ??? Drug use: No   ??? Sexual activity: Not on file   Lifestyle   ??? Physical activity:     Days per week: Not on file     Minutes per session: Not on file   ??? Stress: Not on file   Relationships   ??? Social connections:     Talks on phone: Not on file     Gets together: Not on file     Attends religious service: Not on file     Active member of club or organization: Not on file     Attends meetings of clubs or organizations: Not on file     Relationship status: Not on file   ??? Intimate partner violence:     Fear of current or ex partner: Not on file     Emotionally abused: Not on file     Physically abused: Not on file     Forced sexual activity: Not on file   Other Topics Concern   ??? Not on file   Social History Narrative   ??? Not on file     Current Outpatient Medications   Medication Sig   ??? lansoprazole (PREVACID) 30 mg capsule take 1 capsule by mouth BEFORE BREAKFAST DAILY   ??? VITAMIN D2 50,000 unit capsule take 1 capsule by mouth every week  (Patient taking differently: every thirty (30) days.)   ??? atorvastatin (LIPITOR) 40 mg tablet take 1 tablet by mouth once daily   ??? SITagliptin (JANUVIA) 100 mg tablet Take 1 Tab by mouth daily.   ??? citalopram (CELEXA) 10 mg tablet take 1 tablet by mouth once daily   ??? Blood-Glucose Meter monitoring kit Use daily as directed   ??? glucose blood VI test strips (BLOOD GLUCOSE TEST) strip Use daily as directed   ??? lancets misc Use daily as directed   ??? ergocalciferol (VITAMIN D2) 50,000 unit capsule Take 50,000 Units by mouth every thirty (30) days.   ??? omega-3 fatty acids (FISH OIL) cap Take 2 Tabs by mouth daily.   ??? aspirin 81 mg chewable tablet Take 81 mg by mouth daily.     No current facility-administered medications for this visit.      Allergies   Allergen Reactions   ??? Fish Oil Nausea Only   ??? Metformin Diarrhea   ??? Relafen [Nabumetone] Nausea Only     REVIEW OF SYSTEMS: gyn 4/18 Dr Andrew Au, mammo 3/19, colo 6/19 Dr Rosendo Gros, DEXA 1/18 Dr Margaretann Loveless ??? no vision change or eye pain  Oral ??? no mouth pain, tongue or tooth problems  Ears ??? no hearing loss, ear pain, fullness, no swallowing problems  Cardiac ??? no CP, PND, orthopnea, edema, palpitations or syncope  Chest ??? no breast masses  Resp ??? no wheezing, chronic coughing, dyspnea  Urinary ??? no dysuria, hematuria, flank pain, urgency, frequency    Visit Vitals  BP 114/72 (BP 1 Location: Left  arm, BP Patient Position: Sitting)   Pulse 86   Temp 98.7 ??F (37.1 ??C) (Oral)   Resp 16   Ht '5\' 4"'  (1.626 m)   Wt 164 lb (74.4 kg)   SpO2 97%   BMI 28.15 kg/m??   Affect is appropriate.  Mood stable  No apparent distress  HEENT --Anicteric sclerae.  No JVD, or bruits.  Thyroid fullness on left  Lungs --Clear to auscultation and percussion, normal percussion.  Heart --Regular rate and rhythm, no murmurs, rubs, gallops, or clicks.  Abdomen -- Soft and nontender, no hepatosplenomegaly or masses.  Extremities -- Without cyanosis, clubbing, edema. 2+ pulses equally and  bilaterally.  Varicose veins bilat, trace pedal edema symmetrically    LABS  From 5/10 showed   gluc 110, cr 0.70,               alt 23,                                    chol 154, tg 143, hdl 45, ldl-c 80,                                                            tsh 1.91  From 5/10 showed                      2 hr GTT 89  From 8/10 showed                                                               vit d 23.0, ck 57, aldo 5.4  From 8/11 showed   gluc 108,                                     hba1c 6.3,                   chol 160, tg 172, hdl 39, ldl-c 87,  wbc 5.,7 hb 12.3, plt 235, ua neg,     tsh 2.28  From 5/12 showed   gluc 113, cr 0.71, gfr 94,  alt 16, hba1c 6.2, ldl-p 1989, chol 177, tg 161, hdl 43, ldl-c 102  From 11/12 showed                                                     hba1c 5.9, ldl-p 1218, chol 133, tg 116, hdl 45, ldl-c 65  From 5/13 showed   gluc 105, cr 0.55, gfr 102, alt 8,  hba1c 6.2,                   chol 148, tg 107, hdl 45, ldl-c 82,  wbc 5.0, hb 12.5, plt 172, vit d 40.3  From 11/13 showed  hba1c 6.2, ldl-p 1888, chol 176, tg 155, hdl 49, ldl-c 86,  wbc 6.2, hb 12.3, plt 182, vit d 34.4  From 5/14 showed        hba1c 6.2,     chol 152, tg 157, hdl 39, ldl-c 82  From 1/15 showed   gluc 113, cr 0.68, gfr>60, alt 11, hba1c 6.2,     chol 146, tg 130, hdl 43, ldl-c 77  From 7/15 showed        hba1c 6.3,     chol 133, tg 124, hdl 39, ldl-c 69, wbc 6.3, hb 12.2, plt 193  From 6/16 showed   gluc 106, cr 0.74, gfr>60, alt 26, hba1c 6.3,     chol 126, tg 125, hdl 46, ldl-c 55, wbc 5.7, hb 13.2, plt 203, vit d 7.2,   tsh 1.69  From 12/16 showed gluc 110, cr 0.68, gfr>60, alt 30, hba1c 6.1,     chol 135, tg 147, hdl 47, ldl-c 59, wbc 6.0, hb 12.7, plt 197  From 6/17 showed   gluc 122, cr 0.71, gfr>60, alt 33, hba1c 6.2,     chol 153, tg 140, hdl 46, ldl-c 79, wbc 6.3, hb 13.1, plt 190,              tsh 1.92, hep c neg  From 1/18 showed        hba1c 6.2,    chol 154, tg 181, hdl 41, ldl-c  77  From 7/18 showed   gluc 129, cr 0.63, gfr>60, alt 26, hba1c 6.1,              wbc 6.0, hb 12.4, plt 193, vit d 79.0  From 1/19 showed   gluc 132, cr 0.83, gfr>60,    hba1c 6.1  From 4/19 showed        hba1c 7.0, umar 12.0, chol 124, tg 109, hdl 46, ldl-c 56,               vit d 56.1  From 7/19 showed  gluc 106, cr 0.67, gfr>60, alt 25, hba1c 6.0,               wbc 5.8, hb 13.0, plt 183    Results for orders placed or performed during the hospital encounter of 11/21/18   LIPID PANEL   Result Value Ref Range    LIPID PROFILE          Cholesterol, total 130 <200 MG/DL    Triglyceride 113 <150 MG/DL    HDL Cholesterol 46 40 - 60 MG/DL    LDL, calculated 61.4 0 - 100 MG/DL    VLDL, calculated 22.6 MG/DL    CHOL/HDL Ratio 2.8 0 - 5.0     HEMOGLOBIN A1C W/O EAG   Result Value Ref Range    Hemoglobin A1c 6.0 (H) 4.2 - 5.6 %   MICROALBUMIN, UR, RAND W/ MICROALB/CREAT RATIO   Result Value Ref Range    Microalbumin,urine random 1.99 0 - 3.0 MG/DL    Creatinine, urine 116.00 30 - 125 mg/dL    Microalbumin/Creat ratio (mg/g creat) 17 0 - 30 mg/g     Patient Active Problem List   Diagnosis Code   ??? Vitamin D deficiency E55.9   ??? Anxiety F41.9   ??? Dyslipidemia E78.5   ??? 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 Overweight (BMI 25.0-29.9) E66.3   ??? Controlled type 2 diabetes mellitus, without long-term current use of insulin (HCC) E11.9     Assessment  and plan:  1. DM.  Continue current regimen.  F/u ophth  2. Hyperlipidemia. Continue current.  We did talk about recent vascepa developments, will try to get covered at the next visit  3. Hypovitaminosis D. Continue current regimen.  4. Anxiety.  Continue celexa  5. Overweight.  Lifestyle and dietary measures.  Portion control reiterated.  6. Endometrial ca.  Per Dr Sheral Flow  7. Edema.  Cut out salt, inc activities, declined prn diuretic for now        RTC 8/20    Above conditions discussed at length and patient vocalized understanding.  All questions answered to patient  satisfaction

## 2018-11-30 DIAGNOSIS — M5136 Other intervertebral disc degeneration, lumbar region: Secondary | ICD-10-CM | POA: Diagnosis not present

## 2018-11-30 DIAGNOSIS — G8929 Other chronic pain: Secondary | ICD-10-CM | POA: Diagnosis not present

## 2018-11-30 DIAGNOSIS — M47816 Spondylosis without myelopathy or radiculopathy, lumbar region: Secondary | ICD-10-CM | POA: Diagnosis not present

## 2018-11-30 DIAGNOSIS — M533 Sacrococcygeal disorders, not elsewhere classified: Secondary | ICD-10-CM | POA: Diagnosis not present

## 2018-11-30 DIAGNOSIS — M5442 Lumbago with sciatica, left side: Secondary | ICD-10-CM | POA: Diagnosis not present

## 2018-11-30 DIAGNOSIS — R29898 Other symptoms and signs involving the musculoskeletal system: Secondary | ICD-10-CM | POA: Diagnosis not present

## 2018-12-03 ENCOUNTER — Other Ambulatory Visit: Payer: Self-pay | Admitting: Rheumatology

## 2018-12-05 ENCOUNTER — Telehealth: Payer: Self-pay

## 2018-12-05 NOTE — Telephone Encounter (Signed)
Updated record.

## 2018-12-05 NOTE — Telephone Encounter (Signed)
Last Visit: 06/09/18 Next Visit: 12/08/18 Labs: 09/08/18 stable. WBC is mildly elevated.   Okay to refill per Dr. [name]

## 2018-12-08 ENCOUNTER — Encounter: Payer: Self-pay | Admitting: Physician Assistant

## 2018-12-08 ENCOUNTER — Ambulatory Visit (INDEPENDENT_AMBULATORY_CARE_PROVIDER_SITE_OTHER): Payer: Medicare Other | Admitting: Rheumatology

## 2018-12-08 VITALS — BP 100/70 | HR 75 | Resp 13 | Ht 66.0 in | Wt 180.0 lb

## 2018-12-08 DIAGNOSIS — M1120 Other chondrocalcinosis, unspecified site: Secondary | ICD-10-CM

## 2018-12-08 DIAGNOSIS — I7300 Raynaud's syndrome without gangrene: Secondary | ICD-10-CM | POA: Diagnosis not present

## 2018-12-08 DIAGNOSIS — Z8719 Personal history of other diseases of the digestive system: Secondary | ICD-10-CM

## 2018-12-08 DIAGNOSIS — N3010 Interstitial cystitis (chronic) without hematuria: Secondary | ICD-10-CM | POA: Diagnosis not present

## 2018-12-08 DIAGNOSIS — M5030 Other cervical disc degeneration, unspecified cervical region: Secondary | ICD-10-CM | POA: Diagnosis not present

## 2018-12-08 DIAGNOSIS — M818 Other osteoporosis without current pathological fracture: Secondary | ICD-10-CM

## 2018-12-08 DIAGNOSIS — M5134 Other intervertebral disc degeneration, thoracic region: Secondary | ICD-10-CM

## 2018-12-08 DIAGNOSIS — Z8619 Personal history of other infectious and parasitic diseases: Secondary | ICD-10-CM

## 2018-12-08 DIAGNOSIS — Z96651 Presence of right artificial knee joint: Secondary | ICD-10-CM | POA: Diagnosis not present

## 2018-12-08 DIAGNOSIS — M797 Fibromyalgia: Secondary | ICD-10-CM

## 2018-12-08 DIAGNOSIS — Z79899 Other long term (current) drug therapy: Secondary | ICD-10-CM | POA: Diagnosis not present

## 2018-12-08 DIAGNOSIS — M0609 Rheumatoid arthritis without rheumatoid factor, multiple sites: Secondary | ICD-10-CM

## 2018-12-08 DIAGNOSIS — Z8679 Personal history of other diseases of the circulatory system: Secondary | ICD-10-CM

## 2018-12-08 DIAGNOSIS — M5136 Other intervertebral disc degeneration, lumbar region: Secondary | ICD-10-CM | POA: Diagnosis not present

## 2018-12-08 DIAGNOSIS — R5382 Chronic fatigue, unspecified: Secondary | ICD-10-CM

## 2018-12-08 DIAGNOSIS — Z9889 Other specified postprocedural states: Secondary | ICD-10-CM

## 2018-12-08 DIAGNOSIS — M1712 Unilateral primary osteoarthritis, left knee: Secondary | ICD-10-CM | POA: Diagnosis not present

## 2018-12-09 LAB — CBC WITH DIFFERENTIAL/PLATELET
Absolute Monocytes: 655 cells/uL (ref 200–950)
Basophils Absolute: 84 cells/uL (ref 0–200)
Basophils Relative: 1 %
Eosinophils Absolute: 151 cells/uL (ref 15–500)
Eosinophils Relative: 1.8 %
HCT: 39.9 % (ref 35.0–45.0)
Hemoglobin: 13.5 g/dL (ref 11.7–15.5)
Lymphs Abs: 1966 cells/uL (ref 850–3900)
MCH: 31.6 pg (ref 27.0–33.0)
MCHC: 33.8 g/dL (ref 32.0–36.0)
MCV: 93.4 fL (ref 80.0–100.0)
MPV: 10.7 fL (ref 7.5–12.5)
Monocytes Relative: 7.8 %
Neutro Abs: 5544 cells/uL (ref 1500–7800)
Neutrophils Relative %: 66 %
Platelets: 510 10*3/uL — ABNORMAL HIGH (ref 140–400)
RBC: 4.27 10*6/uL (ref 3.80–5.10)
RDW: 12.5 % (ref 11.0–15.0)
Total Lymphocyte: 23.4 %
WBC: 8.4 10*3/uL (ref 3.8–10.8)

## 2018-12-09 LAB — COMPLETE METABOLIC PANEL WITH GFR
AG Ratio: 1.5 (calc) (ref 1.0–2.5)
ALT: 39 U/L — ABNORMAL HIGH (ref 6–29)
AST: 34 U/L (ref 10–35)
Albumin: 4.2 g/dL (ref 3.6–5.1)
Alkaline phosphatase (APISO): 120 U/L (ref 37–153)
BUN: 10 mg/dL (ref 7–25)
CO2: 29 mmol/L (ref 20–32)
Calcium: 10.2 mg/dL (ref 8.6–10.4)
Chloride: 100 mmol/L (ref 98–110)
Creat: 0.86 mg/dL (ref 0.50–0.99)
GFR, Est African American: 81 mL/min/{1.73_m2} (ref >=60)
GFR, Est Non African American: 70 mL/min/{1.73_m2} (ref >=60)
Globulin: 2.8 g/dL (calc) (ref 1.9–3.7)
Glucose, Bld: 104 mg/dL — ABNORMAL HIGH (ref 65–99)
Potassium: 4.6 mmol/L (ref 3.5–5.3)
Sodium: 138 mmol/L (ref 135–146)
Total Bilirubin: 0.5 mg/dL (ref 0.2–1.2)
Total Protein: 7 g/dL (ref 6.1–8.1)

## 2018-12-09 MED ORDER — CITALOPRAM 10 MG TAB
10 mg | ORAL_TABLET | ORAL | 3 refills | Status: DC
Start: 2018-12-09 — End: 2019-12-06

## 2018-12-09 NOTE — Progress Notes (Signed)
Glucose is 104. ALT borderline elevated.  She is taking Arava 20 mg po daily.  Dr. [name] would like to continue to monitor at this time.  Plts elevated trending up. Please notify patient and send to PCP and if she has a hematologist.

## 2018-12-15 ENCOUNTER — Ambulatory Visit: Payer: Medicare Other | Admitting: Family Medicine

## 2018-12-15 ENCOUNTER — Encounter: Payer: Self-pay | Admitting: Family Medicine

## 2018-12-15 VITALS — BP 128/69 | HR 72 | Ht 66.0 in | Wt 181.0 lb

## 2018-12-15 DIAGNOSIS — R29898 Other symptoms and signs involving the musculoskeletal system: Secondary | ICD-10-CM | POA: Diagnosis not present

## 2018-12-15 DIAGNOSIS — L821 Other seborrheic keratosis: Secondary | ICD-10-CM

## 2018-12-15 DIAGNOSIS — E538 Deficiency of other specified B group vitamins: Secondary | ICD-10-CM

## 2018-12-15 MED ORDER — CYANOCOBALAMIN 1000 MCG/ML IJ SOLN
1000.0000 ug | Freq: Once | INTRAMUSCULAR | Status: AC
  Administered 2018-12-15: 1000 ug via INTRAMUSCULAR

## 2018-12-15 NOTE — Progress Notes (Signed)
Cryotherapy Procedure Note  Pre-operative Diagnosis: Seborrheic keratoses  Post-operative Diagnosis: same  Locations: face, left arm and both upper thighs  Indications: itching and irritation  Anesthesia: not required    Procedure Details  Patient informed of risks (permanent scarring, infection, light or dark discoloration, bleeding, infection, weakness, numbness and recurrence of the lesion) and benefits of the procedure and verbal informed consent obtained.  The areas are treated with liquid nitrogen therapy, frozen until ice ball extended 1-2 mm beyond lesion, allowed to thaw, and treated again. The patient tolerated procedure well.  The patient was instructed on post-op care, warned that there may be blister formation, redness and pain. Recommend OTC analgesia as needed for pain.  Condition: Stable  Complications: none.  Plan: 1. Instructed to keep the area dry and covered for 24-48h and clean thereafter. 2. Warning signs of infection were reviewed.   3. Recommended that the patient use OTC acetaminophen as needed for pain.  4. Return  PRN.

## 2018-12-15 NOTE — Progress Notes (Signed)
Patient here today for Vitamin b12 injection. Patient has no complaints of lethargy, palpitations or problems with medication.  Pt tolerated well, injection given in RUOQ w/o any complications.Mia [name], [place]

## 2018-12-19 DIAGNOSIS — K581 Irritable bowel syndrome with constipation: Secondary | ICD-10-CM | POA: Diagnosis not present

## 2018-12-19 DIAGNOSIS — K594 Anal spasm: Secondary | ICD-10-CM | POA: Diagnosis not present

## 2018-12-19 DIAGNOSIS — K219 Gastro-esophageal reflux disease without esophagitis: Secondary | ICD-10-CM | POA: Diagnosis not present

## 2018-12-20 ENCOUNTER — Encounter: Payer: Self-pay | Admitting: Neurology

## 2018-12-20 ENCOUNTER — Ambulatory Visit (INDEPENDENT_AMBULATORY_CARE_PROVIDER_SITE_OTHER): Payer: Medicare Other | Admitting: Neurology

## 2018-12-20 VITALS — BP 106/64 | HR 73 | Ht 66.0 in | Wt 180.0 lb

## 2018-12-20 DIAGNOSIS — R413 Other amnesia: Secondary | ICD-10-CM | POA: Diagnosis not present

## 2018-12-20 DIAGNOSIS — M797 Fibromyalgia: Secondary | ICD-10-CM | POA: Diagnosis not present

## 2018-12-20 DIAGNOSIS — G250 Essential tremor: Secondary | ICD-10-CM

## 2018-12-20 NOTE — Progress Notes (Signed)
Reason for visit: Memory disturbance, essential tremor  Mia [name] is an 68 y.o. female  History of present illness:  Mia [name] is a 68 year old right-handed white female with a history of a mild memory disturbance, she believes that her memory is gradually worsening over time, she is on Aricept and Namenda, she tolerates these medications well.  The patient mainly is concerned today about her low back pain which is being evaluated by [name] from neurosurgery.  She went to the emergency room on 30 June 2018 with back pain, she has had a recent MRI of the lumbar spine, the results of this not available to me.  She has also recently had EMG and nerve conduction study evaluation, she is to go over the results with her doctor in the next day or 2.  She has pain in the left lower back that radiates down to the left knee, the pain is there with lying down, sitting, and standing.  The patient has chronic issues with weakness of the arms and legs.  She has a history of low B12 levels, she gets B12 shots once a month.  She is on Mysoline for the tremor.  She returns to this office for an evaluation.  Past Medical History:  Diagnosis Date  . Anal fissure   . Anemia   . Arthritis   . Blood transfusion   . C. difficile colitis   . Chest pain    a. 01/2013 MV: EF 59%, no ischemia.  . Chronic Dyspnea    a. 01/2013 Echo: EF 60-65%, Gr 1 DD, PASP 1mmHg.  . Diabetes mellitus   . Fibromyalgia   . Gall stones   . GERD (gastroesophageal reflux disease)    gastritis  . Hemorrhoids   . Hypertension   . Interstitial cystitis   . MCI (mild cognitive impairment) 11/25/2017  . Memory difficulty 12/14/2013  . Neuromuscular disorder ([place])    sclerosis  . Osteoporosis   . Peptic ulcer   . PONV (postoperative nausea and vomiting)   . Pseudogout   . Raynaud phenomenon   . Rectal bleeding   . Sleep apnea    a. on cpap.  Marland Kitchen SVT (supraventricular tachycardia) ([place])   . Syncope 09/10/2015  .  Thyroid disease    hypothyroidism  . Tremor, essential 05/14/2016    Past Surgical History:  Procedure Laterality Date  . APPENDECTOMY    . BLADDER SURGERY     x2  . BLADDER SURGERY    . BREAST BIOPSY    . CARDIAC CATHETERIZATION    . CARPAL TUNNEL RELEASE    . CATARACT EXTRACTION Bilateral   . CHOLECYSTECTOMY    . DILATION AND CURETTAGE OF UTERUS    . ENTEROCELE REPAIR     x2  . EYE SURGERY     retina  . hysterectomy - unknown type    . JOINT REPLACEMENT    . KNEE ARTHROPLASTY    . KNEE SURGERY     x6  . NECK SURGERY     fusion  . OOPHORECTOMY    . QUADRICEPS REPAIR Right   . RECTOCELE REPAIR     x2  . SHOULDER ARTHROSCOPY WITH SUBACROMIAL DECOMPRESSION, ROTATOR CUFF REPAIR AND BICEP TENDON REPAIR  10/06/2012   Procedure: SHOULDER ARTHROSCOPY WITH SUBACROMIAL DECOMPRESSION, ROTATOR CUFF REPAIR AND BICEP TENDON REPAIR;  Surgeon: Nita Sells, MD;  Location: [facility];  Service: Orthopedics;  Laterality: Right;  Arthroscopic  Repair  of  Subscapularis, Open Biceps Tenodesis  . SHOULDER SURGERY     bilateral- bones spur  . TONSILLECTOMY    . TOTAL KNEE ARTHROPLASTY    . TOTAL SHOULDER ARTHROPLASTY      Family History  Problem Relation Age of Onset  . Heart attack Mother   . Stroke Mother   . Diabetes Mother   . Heart disease Mother   . Colon polyps Mother   . Asthma Mother   . Breast cancer Maternal Grandmother   . Wilson's disease Maternal Grandmother   . Pancreatic cancer Maternal Grandfather   . Heart disease Father   . Heart attack Father   . Asthma Father   . Stroke Sister   . Hypertension Sister   . Rheum arthritis Sister   . Dementia Sister   . Asthma Sister   . Lupus Sister   . Asthma Sister     Social history:  reports that she has never smoked. She has never used smokeless tobacco. She reports that she does not drink alcohol or use drugs.    Allergies  Allergen Reactions  . Codeine Nausea And Vomiting  . Myrbetriq   [Mirabegron] Other (See Comments)    SEVERE HEADACHE  . Percocet [Oxycodone-Acetaminophen] Nausea And Vomiting  . Celebrex [Celecoxib] Other (See Comments)    Other reaction(s): Other flushed  . Darvocet [Propoxyphene N-Acetaminophen] Nausea And Vomiting  . Erythromycin Nausea And Vomiting    Nausea and vomiting   . Hydrocodone Nausea And Vomiting  . Lyrica [Pregabalin] Swelling    Legs swelling   . Macrodantin [Nitrofurantoin] Nausea And Vomiting  . Toradol [Ketorolac Tromethamine] Nausea And Vomiting    Per patient, only PO form causes nausea and vomiting. Can take injection without issue  . Tramadol Nausea And Vomiting  . Verapamil Nausea And Vomiting    REACTION: intolerance    Medications:  Prior to Admission medications   Medication Sig Start Date End Date Taking? Authorizing Provider  AMBULATORY NON FORMULARY MEDICATION Medication Name: One touch ultra strips. Check fasting blood sugar in the morning and as needed.  Dx - [id number] Fax to [phone] 02/04/17  Yes Hali Marry, MD  atorvastatin (LIPITOR) 40 MG tablet TAKE 1 TABLET EVERY DAY 07/18/18  Yes Nahser, Wonda Cheng, MD  Cyanocobalamin (VITAMIN B-12 IJ) Inject as directed every 30 (thirty) days.   Yes [provider]  denosumab (PROLIA) 60 MG/ML SOLN injection Inject 60 mg into the skin every 6 (six) months. Administer in upper arm, thigh, or abdomen   Yes [provider]  donepezil (ARICEPT) 10 MG tablet Take 1 tablet (10 mg total) by mouth at bedtime. 06/02/18  Yes Nier Givens, NP  fesoterodine (TOVIAZ) 8 MG TB24 tablet Take 8 mg by mouth daily.   Yes [provider]  fluticasone (FLONASE) 50 MCG/ACT nasal spray Place 2 sprays into both nostrils daily. Patient taking differently: Place 2 sprays into both nostrils as needed.  09/14/18  Yes Emeterio Reeve, DO  furosemide (LASIX) 20 MG tablet Take 1 tablet (20 mg total) by mouth daily. 08/29/18  Yes Nahser, Wonda Cheng, MD  gabapentin  (NEURONTIN) 300 MG capsule TAKE 1 TO 2 CAPSULES BY MOUTH UP TO TWO TIMES DAILY AS NEEDED 10/20/18  Yes Hali Marry, MD  HYDROmorphone (DILAUDID) 2 MG tablet Take 2 mg by mouth daily as needed for moderate pain.  07/18/18  Yes [provider]  ibuprofen (ADVIL,MOTRIN) 800 MG tablet Take 1 tablet (800 mg total) by mouth daily with  supper. Patient taking differently: Take 800 mg by mouth as needed.  12/03/17  Yes Silverio Decamp, MD  leflunomide (ARAVA) 20 MG tablet TAKE 1 TABLET EVERY DAY 12/05/18  Yes Deveshwar, Abel Presto, MD  Loratadine-Pseudoephedrine (CLARITIN-D 12 HOUR PO) Take by mouth as needed.   Yes [provider]  losartan (COZAAR) 50 MG tablet Take 1 tablet (50 mg total) by mouth daily. 10/06/18  Yes Nahser, Wonda Cheng, MD  Melatonin 5 MG TABS Take 5 mg by mouth at bedtime.    Yes [provider]  memantine (NAMENDA) 10 MG tablet TAKE 1 TABLET (10 MG TOTAL) 2 TIMES DAILY. 01/17/18  Yes Kathrynn Ducking, MD  metFORMIN (GLUCOPHAGE-XR) 500 MG 24 hr tablet TAKE 2 TABLETS EVERY DAY WITH BREAKFAST 09/20/18  Yes Hali Marry, MD  metoprolol tartrate (LOPRESSOR) 100 MG tablet Take 1.5 tablets (150 mg total) by mouth 2 (two) times daily. 12/08/17  Yes Nahser, Wonda Cheng, MD  NON FORMULARY Place into the nose at bedtime. cpap machine   Yes [provider]  ondansetron (ZOFRAN) 4 MG tablet TAKE 2 TABLETS BY MOUTH 2 TIMES A DAY AS NEEDED FOR NAUSEA FOR UP TO 4 DOSES. 02/28/18  Yes Hali Marry, MD  ONE TOUCH ULTRA TEST test strip USE TO TEST FASTING BLOOD SUGAR IN THE MORNING AND AS NEEDED 02/23/18  Yes Hali Marry, MD  pantoprazole (PROTONIX) 40 MG tablet Take 40 mg by mouth 2 (two) times daily.   Yes [provider]  polyethylene glycol powder (MIRALAX) powder Take 17 g by mouth daily. 12/16/17  Yes [provider]  potassium chloride SA (K-DUR,KLOR-CON) 20 MEQ tablet Take 1 tablet (20 mEq total) by mouth daily. 08/29/18   Yes Nahser, Wonda Cheng, MD  primidone (MYSOLINE) 50 MG tablet TAKE 1 TABLET AT BEDTIME 11/21/18  Yes Hali Marry, MD  Probiotic Product (PROBIOTIC ADVANCED PO) Take 1 capsule by mouth daily.   Yes [provider]  venlafaxine XR (EFFEXOR-XR) 75 MG 24 hr capsule TAKE 1 CAPSULE BY MOUTH  DAILY WITH BREAKFAST 03/08/18  Yes Kathrynn Ducking, MD    ROS:  Out of a complete 14 system review of symptoms, the patient complains only of the following symptoms, and all other reviewed systems are negative.  Fatigue, excessive sweating Runny nose Excessive thirst Swollen abdomen, constipation, rectal pain Sleep apnea Incontinence of the bladder, urinary urgency Joint pain, back pain, aching muscles, walking difficulty Memory loss  Blood pressure 106/64, pulse 73, height 5\' 6"  (1.676 m), weight 180 lb (81.6 kg), SpO2 93 %.  Physical Exam  General: The patient is alert and cooperative at the time of the examination.  Skin: No significant peripheral edema is noted.   Neurologic Exam  Mental status: The patient is alert and oriented x 3 at the time of the examination. The Mini-Mental status examination done today shows a total score of 22/30.   Cranial nerves: Facial symmetry is present. Speech is normal, no aphasia or dysarthria is noted. Extraocular movements are full. Visual fields are full.  Motor: The patient has good strength in the upper extremities, she does have some giveaway weakness with the shoulders bilaterally.  With the lower extremities she also has some giveaway weakness with hip flexion bilaterally otherwise, she has good strength throughout.  Sensory examination: Soft touch sensation is symmetric on the face, arms, and legs.  Coordination: The patient has good finger-nose-finger and heel-to-shin bilaterally.  Gait and station: The patient has a limping  type gait on the left leg.  Romberg is unsteady, the patient has a tendency to fall  Reflexes: Deep  tendon reflexes are symmetric.   Assessment/Plan:  1.  History of memory disturbance  2.  Low back pain, left leg pain  3.  Fibromyalgia  4.  Gait disturbance  5.  Essential tremor  The patient appears to have features of giveaway weakness on clinical examination, she has had recent evaluation of the lumbar spine and EMG and nerve conduction study evaluation, she is followed now by [name] from neurosurgery.  The patient be sent for cognitive training through speech therapy.  She will remain on Aricept and Namenda and she will remain on Mysoline.  She will follow-up here in about 6 months.  Jill Alexanders MD 12/20/2018 10:35 AM  [facility] [address] [place], [place] [address]  Phone [phone] Fax [phone]

## 2018-12-21 DIAGNOSIS — M47816 Spondylosis without myelopathy or radiculopathy, lumbar region: Secondary | ICD-10-CM | POA: Diagnosis not present

## 2018-12-21 DIAGNOSIS — G8929 Other chronic pain: Secondary | ICD-10-CM | POA: Diagnosis not present

## 2018-12-21 DIAGNOSIS — M5136 Other intervertebral disc degeneration, lumbar region: Secondary | ICD-10-CM | POA: Diagnosis not present

## 2018-12-21 DIAGNOSIS — M5442 Lumbago with sciatica, left side: Secondary | ICD-10-CM | POA: Diagnosis not present

## 2018-12-21 DIAGNOSIS — R29898 Other symptoms and signs involving the musculoskeletal system: Secondary | ICD-10-CM | POA: Diagnosis not present

## 2018-12-21 DIAGNOSIS — G5702 Lesion of sciatic nerve, left lower limb: Secondary | ICD-10-CM | POA: Diagnosis not present

## 2018-12-28 DIAGNOSIS — N3941 Urge incontinence: Secondary | ICD-10-CM | POA: Diagnosis not present

## 2018-12-28 DIAGNOSIS — N3010 Interstitial cystitis (chronic) without hematuria: Secondary | ICD-10-CM | POA: Diagnosis not present

## 2018-12-28 DIAGNOSIS — N3020 Other chronic cystitis without hematuria: Secondary | ICD-10-CM | POA: Diagnosis not present

## 2018-12-29 ENCOUNTER — Encounter

## 2019-01-10 ENCOUNTER — Inpatient Hospital Stay: Admit: 2019-01-10 | Payer: MEDICARE | Attending: Internal Medicine | Primary: Internal Medicine

## 2019-01-10 DIAGNOSIS — Z1231 Encounter for screening mammogram for malignant neoplasm of breast: Secondary | ICD-10-CM

## 2019-01-12 ENCOUNTER — Other Ambulatory Visit: Payer: Self-pay

## 2019-01-12 ENCOUNTER — Ambulatory Visit (INDEPENDENT_AMBULATORY_CARE_PROVIDER_SITE_OTHER): Payer: Medicare Other | Admitting: Family Medicine

## 2019-01-12 ENCOUNTER — Encounter: Payer: Self-pay | Admitting: Family Medicine

## 2019-01-12 ENCOUNTER — Telehealth

## 2019-01-12 VITALS — BP 127/65 | HR 80 | Wt 179.0 lb

## 2019-01-12 DIAGNOSIS — E538 Deficiency of other specified B group vitamins: Secondary | ICD-10-CM

## 2019-01-12 DIAGNOSIS — D510 Vitamin B12 deficiency anemia due to intrinsic factor deficiency: Secondary | ICD-10-CM

## 2019-01-12 MED ORDER — CYANOCOBALAMIN 1000 MCG/ML IJ SOLN
1000.0000 ug | Freq: Once | INTRAMUSCULAR | Status: AC
  Administered 2019-01-12: 1000 ug via INTRAMUSCULAR

## 2019-01-12 NOTE — Progress Notes (Signed)
Agree with documentation as above.   Maryjane Benedict, MD  

## 2019-01-12 NOTE — Progress Notes (Signed)
Established Patient Office Visit  Subjective:  Patient ID: Mia [name], female    DOB: 08-06-1951  Age: 68 y.o. MRN: [id number]  CC:  Chief Complaint  Patient presents with  . Injections    vitamin B12    HPI Mia [name] presents for vitamin b12 injection. Patient has no complaints of fatigue, SOB, chest pain or palpitations. Patient tolerated injection well without complications. KG LPN  Past Medical History:  Diagnosis Date  . Anal fissure   . Anemia   . Arthritis   . Blood transfusion   . C. difficile colitis   . Chest pain    a. 01/2013 MV: EF 59%, no ischemia.  . Chronic Dyspnea    a. 01/2013 Echo: EF 60-65%, Gr 1 DD, PASP 59mmHg.  . Diabetes mellitus   . Fibromyalgia   . Gall stones   . GERD (gastroesophageal reflux disease)    gastritis  . Hemorrhoids   . Hypertension   . Interstitial cystitis   . MCI (mild cognitive impairment) 11/25/2017  . Memory difficulty 12/14/2013  . Neuromuscular disorder ([place])    sclerosis  . Osteoporosis   . Peptic ulcer   . PONV (postoperative nausea and vomiting)   . Pseudogout   . Raynaud phenomenon   . Rectal bleeding   . Sleep apnea    a. on cpap.  Marland Kitchen SVT (supraventricular tachycardia) ([place])   . Syncope 09/10/2015  . Thyroid disease    hypothyroidism  . Tremor, essential 05/14/2016    Past Surgical History:  Procedure Laterality Date  . APPENDECTOMY    . BLADDER SURGERY     x2  . BLADDER SURGERY    . BREAST BIOPSY    . CARDIAC CATHETERIZATION    . CARPAL TUNNEL RELEASE    . CATARACT EXTRACTION Bilateral   . CHOLECYSTECTOMY    . DILATION AND CURETTAGE OF UTERUS    . ENTEROCELE REPAIR     x2  . EYE SURGERY     retina  . hysterectomy - unknown type    . JOINT REPLACEMENT    . KNEE ARTHROPLASTY    . KNEE SURGERY     x6  . NECK SURGERY     fusion  . OOPHORECTOMY    . QUADRICEPS REPAIR Right   . RECTOCELE REPAIR     x2  . SHOULDER ARTHROSCOPY WITH SUBACROMIAL DECOMPRESSION, ROTATOR CUFF REPAIR AND BICEP  TENDON REPAIR  10/06/2012   Procedure: SHOULDER ARTHROSCOPY WITH SUBACROMIAL DECOMPRESSION, ROTATOR CUFF REPAIR AND BICEP TENDON REPAIR;  Surgeon: Nita Sells, MD;  Location: [facility];  Service: Orthopedics;  Laterality: Right;  Arthroscopic  Repair  of  Subscapularis, Open Biceps Tenodesis  . SHOULDER SURGERY     bilateral- bones spur  . TONSILLECTOMY    . TOTAL KNEE ARTHROPLASTY    . TOTAL SHOULDER ARTHROPLASTY      Family History  Problem Relation Age of Onset  . Heart attack Mother   . Stroke Mother   . Diabetes Mother   . Heart disease Mother   . Colon polyps Mother   . Asthma Mother   . Breast cancer Maternal Grandmother   . Wilson's disease Maternal Grandmother   . Pancreatic cancer Maternal Grandfather   . Heart disease Father   . Heart attack Father   . Asthma Father   . Stroke Sister   . Hypertension Sister   . Rheum arthritis Sister   . Dementia Sister   . Asthma  Sister   . Lupus Sister   . Asthma Sister     Social History   Socioeconomic History  . Marital status: Married    Spouse name: Not on file  . Number of children: 2  . Years of education: 47  . Highest education level: Not on file  Occupational History  . Occupation: Retired  Scientific laboratory technician  . Financial resource strain: Not on file  . Food insecurity:    Worry: Not on file    Inability: Not on file  . Transportation needs:    Medical: Not on file    Non-medical: Not on file  Tobacco Use  . Smoking status: Never Smoker  . Smokeless tobacco: Never Used  Substance and Sexual Activity  . Alcohol use: No    Alcohol/week: 0.0 standard drinks  . Drug use: No  . Sexual activity: Not on file  Lifestyle  . Physical activity:    Days per week: Not on file    Minutes per session: Not on file  . Stress: Not on file  Relationships  . Social connections:    Talks on phone: Not on file    Gets together: Not on file    Attends religious service: Not on file    Active  member of club or organization: Not on file    Attends meetings of clubs or organizations: Not on file    Relationship status: Not on file  . Intimate partner violence:    Fear of current or ex partner: Not on file    Emotionally abused: Not on file    Physically abused: Not on file    Forced sexual activity: Not on file  Other Topics Concern  . Not on file  Social History Narrative   Patient drinks about 3-4 cups of caffeine daily.   Patient is right handed.    Outpatient Medications Prior to Visit  Medication Sig Dispense Refill  . AMBULATORY NON FORMULARY MEDICATION Medication Name: One touch ultra strips. Check fasting blood sugar in the morning and as needed.  Dx - [id number] Fax to [phone] 50 strip 11  . atorvastatin (LIPITOR) 40 MG tablet TAKE 1 TABLET EVERY DAY 90 tablet 2  . Cyanocobalamin (VITAMIN B-12 IJ) Inject as directed every 30 (thirty) days.    Marland Kitchen denosumab (PROLIA) 60 MG/ML SOLN injection Inject 60 mg into the skin every 6 (six) months. Administer in upper arm, thigh, or abdomen    . donepezil (ARICEPT) 10 MG tablet Take 1 tablet (10 mg total) by mouth at bedtime. 90 tablet 3  . fesoterodine (TOVIAZ) 8 MG TB24 tablet Take 8 mg by mouth daily.    . fluticasone (FLONASE) 50 MCG/ACT nasal spray Place 2 sprays into both nostrils daily. (Patient taking differently: Place 2 sprays into both nostrils as needed. ) 16 g 6  . furosemide (LASIX) 20 MG tablet Take 1 tablet (20 mg total) by mouth daily. 90 tablet 3  . gabapentin (NEURONTIN) 300 MG capsule TAKE 1 TO 2 CAPSULES BY MOUTH UP TO TWO TIMES DAILY AS NEEDED 180 capsule 3  . HYDROmorphone (DILAUDID) 2 MG tablet Take 2 mg by mouth daily as needed for moderate pain.   0  . ibuprofen (ADVIL,MOTRIN) 800 MG tablet Take 1 tablet (800 mg total) by mouth daily with supper. (Patient taking differently: Take 800 mg by mouth as needed. ) 30 tablet 3  . leflunomide (ARAVA) 20 MG tablet TAKE 1 TABLET EVERY DAY 90 tablet 0  .  Loratadine-Pseudoephedrine (CLARITIN-D 12 HOUR PO) Take by mouth as needed.    Marland Kitchen losartan (COZAAR) 50 MG tablet Take 1 tablet (50 mg total) by mouth daily. 90 tablet 3  . Melatonin 5 MG TABS Take 5 mg by mouth at bedtime.     . memantine (NAMENDA) 10 MG tablet TAKE 1 TABLET (10 MG TOTAL) 2 TIMES DAILY. 180 tablet 4  . metFORMIN (GLUCOPHAGE-XR) 500 MG 24 hr tablet TAKE 2 TABLETS EVERY DAY WITH BREAKFAST 180 tablet 1  . metoprolol tartrate (LOPRESSOR) 100 MG tablet Take 1.5 tablets (150 mg total) by mouth 2 (two) times daily. 270 tablet 3  . NON FORMULARY Place into the nose at bedtime. cpap machine    . ondansetron (ZOFRAN) 4 MG tablet TAKE 2 TABLETS BY MOUTH 2 TIMES A DAY AS NEEDED FOR NAUSEA FOR UP TO 4 DOSES. 20 tablet 0  . ONE TOUCH ULTRA TEST test strip USE TO TEST FASTING BLOOD SUGAR IN THE MORNING AND AS NEEDED 50 each 11  . pantoprazole (PROTONIX) 40 MG tablet Take 40 mg by mouth 2 (two) times daily.    . polyethylene glycol powder (MIRALAX) powder Take 17 g by mouth daily.    . potassium chloride SA (K-DUR,KLOR-CON) 20 MEQ tablet Take 1 tablet (20 mEq total) by mouth daily. 90 tablet 3  . primidone (MYSOLINE) 50 MG tablet TAKE 1 TABLET AT BEDTIME 90 tablet 1  . Probiotic Product (PROBIOTIC ADVANCED PO) Take 1 capsule by mouth daily.    Marland Kitchen venlafaxine XR (EFFEXOR-XR) 75 MG 24 hr capsule TAKE 1 CAPSULE BY MOUTH  DAILY WITH BREAKFAST 90 capsule 3   No facility-administered medications prior to visit.     Allergies  Allergen Reactions  . Codeine Nausea And Vomiting  . Myrbetriq  [Mirabegron] Other (See Comments)    SEVERE HEADACHE  . Percocet [Oxycodone-Acetaminophen] Nausea And Vomiting  . Celebrex [Celecoxib] Other (See Comments)    Other reaction(s): Other flushed  . Darvocet [Propoxyphene N-Acetaminophen] Nausea And Vomiting  . Erythromycin Nausea And Vomiting    Nausea and vomiting   . Hydrocodone Nausea And Vomiting  . Lyrica [Pregabalin] Swelling    Legs swelling   .  Macrodantin [Nitrofurantoin] Nausea And Vomiting  . Toradol [Ketorolac Tromethamine] Nausea And Vomiting    Per patient, only PO form causes nausea and vomiting. Can take injection without issue  . Tramadol Nausea And Vomiting  . Verapamil Nausea And Vomiting    REACTION: intolerance    ROS Review of Systems    Objective:    Physical Exam  BP 127/65 (BP Location: Right Arm, Patient Position: Sitting, Cuff Size: Normal)   Pulse 80   Wt 179 lb (81.2 kg)   SpO2 98%   BMI 28.89 kg/m  Wt Readings from Last 3 Encounters:  01/12/19 179 lb (81.2 kg)  12/20/18 180 lb (81.6 kg)  12/15/18 181 lb (82.1 kg)     There are no preventive care reminders to display for this patient.  There are no preventive care reminders to display for this patient.  Lab Results  Component Value Date   TSH 1.33 11/14/2018   Lab Results  Component Value Date   WBC 8.4 12/08/2018   HGB 13.5 12/08/2018   HCT 39.9 12/08/2018   MCV 93.4 12/08/2018   PLT 510 (H) 12/08/2018   Lab Results  Component Value Date   NA 138 12/08/2018   K 4.6 12/08/2018   CO2 29 12/08/2018   GLUCOSE 104 (H) 12/08/2018  BUN 10 12/08/2018   CREATININE 0.86 12/08/2018   BILITOT 0.5 12/08/2018   ALKPHOS 85 06/04/2017   AST 34 12/08/2018   ALT 39 (H) 12/08/2018   PROT 7.0 12/08/2018   ALBUMIN 4.3 06/04/2017   CALCIUM 10.2 12/08/2018   GFR 71.66 04/03/2015   Lab Results  Component Value Date   CHOL 119 06/18/2017   Lab Results  Component Value Date   HDL 40 06/18/2017   Lab Results  Component Value Date   LDLCALC 47 06/18/2017   Lab Results  Component Value Date   TRIG 161 (H) 06/18/2017   Lab Results  Component Value Date   CHOLHDL 3.0 06/18/2017   Lab Results  Component Value Date   HGBA1C 5.8 (A) 11/14/2018      Assessment & Plan:  Advised patient to schedule next injection in 30 days. KG LPN Problem List Items Addressed This Visit    B12 deficiency - Primary   Relevant Medications    cyanocobalamin ((VITAMIN B-12)) injection 1,000 mcg (Completed) (Start on 01/12/2019 10:15 AM)      Meds ordered this encounter  Medications  . cyanocobalamin ((VITAMIN B-12)) injection 1,000 mcg    Follow-up: No follow-ups on file.    Joanne Chars, LPN

## 2019-01-12 NOTE — Telephone Encounter (Signed)
pls call    Abn mammo on right - need dx mammo and possibly [place] - ordered

## 2019-01-13 DIAGNOSIS — G5702 Lesion of sciatic nerve, left lower limb: Secondary | ICD-10-CM | POA: Diagnosis not present

## 2019-01-14 ENCOUNTER — Other Ambulatory Visit: Payer: Self-pay | Admitting: Cardiovascular Disease

## 2019-01-16 ENCOUNTER — Encounter

## 2019-01-16 NOTE — Telephone Encounter (Signed)
Last Visit: 11/29/2018 with MD Ky Barban  Next Appointment: 06/06/2019 with Ky Barban  Previous Refill Encounter(s): 02/22/2018 per MD Dumaran #[id number]    Requested Prescriptions     Pending Prescriptions Disp Refills   ??? SITagliptin (JANUVIA) 100 mg tablet 90 Tab 3     Sig: Take 1 Tab by mouth daily.

## 2019-01-16 NOTE — Telephone Encounter (Signed)
She is requesting a 90 day supply.

## 2019-01-17 MED ORDER — SITAGLIPTIN 100 MG TAB
100 mg | ORAL_TABLET | Freq: Every day | ORAL | 3 refills | Status: DC
Start: 2019-01-17 — End: 2020-01-25

## 2019-01-19 ENCOUNTER — Telehealth: Payer: Self-pay | Admitting: Rheumatology

## 2019-01-19 ENCOUNTER — Inpatient Hospital Stay: Payer: MEDICARE | Attending: Internal Medicine | Primary: Internal Medicine

## 2019-01-19 DIAGNOSIS — G8929 Other chronic pain: Secondary | ICD-10-CM | POA: Diagnosis not present

## 2019-01-19 DIAGNOSIS — M5442 Lumbago with sciatica, left side: Secondary | ICD-10-CM | POA: Diagnosis not present

## 2019-01-19 DIAGNOSIS — M5136 Other intervertebral disc degeneration, lumbar region: Secondary | ICD-10-CM | POA: Diagnosis not present

## 2019-01-19 DIAGNOSIS — G5702 Lesion of sciatic nerve, left lower limb: Secondary | ICD-10-CM | POA: Diagnosis not present

## 2019-01-19 DIAGNOSIS — M47816 Spondylosis without myelopathy or radiculopathy, lumbar region: Secondary | ICD-10-CM | POA: Diagnosis not present

## 2019-01-19 DIAGNOSIS — R29898 Other symptoms and signs involving the musculoskeletal system: Secondary | ICD-10-CM | POA: Diagnosis not present

## 2019-01-19 NOTE — Telephone Encounter (Signed)
received fax from [facility].

## 2019-01-19 NOTE — Telephone Encounter (Signed)
Patient called to let you know [employer] will be faxing a Tier modification for her Leflutnomide. Per patient this is done every year for this medication, and it is time for it to be done this year.

## 2019-01-20 ENCOUNTER — Encounter

## 2019-01-20 NOTE — Telephone Encounter (Signed)
Filled out paperwork from [facility]. Faxed.

## 2019-01-20 NOTE — Telephone Encounter (Signed)
Q is calling from central scheduling asking if the Ultrasound is urgent or can it be done at a later time?

## 2019-01-20 NOTE — Telephone Encounter (Signed)
Left message below on voice mail

## 2019-01-20 NOTE — Telephone Encounter (Signed)
Would get done soon for abn mammo

## 2019-01-20 NOTE — Telephone Encounter (Addendum)
error 

## 2019-01-26 ENCOUNTER — Inpatient Hospital Stay: Admit: 2019-01-26 | Payer: MEDICARE | Attending: Internal Medicine | Primary: Internal Medicine

## 2019-01-26 DIAGNOSIS — N6310 Unspecified lump in the right breast, unspecified quadrant: Secondary | ICD-10-CM

## 2019-01-26 DIAGNOSIS — R928 Other abnormal and inconclusive findings on diagnostic imaging of breast: Secondary | ICD-10-CM

## 2019-01-28 DIAGNOSIS — G5702 Lesion of sciatic nerve, left lower limb: Secondary | ICD-10-CM | POA: Diagnosis not present

## 2019-01-30 ENCOUNTER — Telehealth: Payer: Self-pay | Admitting: Rheumatology

## 2019-01-30 NOTE — Telephone Encounter (Signed)
Patient called stating she received a letter from [facility] stating her prescription of Leflunomide was denied because it didn't state the reason for the medication.  Patient states they want her to try a "lower costing drug."  Patient states she has been taking the medication for a couple of years and it doesn't interact with the other medications that she is currently taking.  Patient requested the paperwork be refaxed.

## 2019-01-31 NOTE — Telephone Encounter (Signed)
Attempted to contact the patient and left message for patient to call the office.  

## 2019-01-31 NOTE — Telephone Encounter (Signed)
Patient states that [facility] sent her a letter stating the tier exemption has been denied. Patient provided number to contact [facility] [phone]. Will contact [employer] to see if they will reconsider tier exemption.

## 2019-02-08 DIAGNOSIS — M5136 Other intervertebral disc degeneration, lumbar region: Secondary | ICD-10-CM | POA: Diagnosis not present

## 2019-02-08 DIAGNOSIS — M47816 Spondylosis without myelopathy or radiculopathy, lumbar region: Secondary | ICD-10-CM | POA: Diagnosis not present

## 2019-02-08 DIAGNOSIS — M48061 Spinal stenosis, lumbar region without neurogenic claudication: Secondary | ICD-10-CM | POA: Diagnosis not present

## 2019-02-08 DIAGNOSIS — G5702 Lesion of sciatic nerve, left lower limb: Secondary | ICD-10-CM | POA: Diagnosis not present

## 2019-02-08 DIAGNOSIS — G894 Chronic pain syndrome: Secondary | ICD-10-CM | POA: Diagnosis not present

## 2019-02-13 ENCOUNTER — Ambulatory Visit (INDEPENDENT_AMBULATORY_CARE_PROVIDER_SITE_OTHER): Payer: Medicare Other | Admitting: Family Medicine

## 2019-02-13 VITALS — BP 109/62 | HR 65

## 2019-02-13 DIAGNOSIS — E538 Deficiency of other specified B group vitamins: Secondary | ICD-10-CM | POA: Diagnosis not present

## 2019-02-13 DIAGNOSIS — D510 Vitamin B12 deficiency anemia due to intrinsic factor deficiency: Secondary | ICD-10-CM

## 2019-02-13 MED ORDER — CYANOCOBALAMIN 1000 MCG/ML IJ SOLN
1000.0000 ug | Freq: Once | INTRAMUSCULAR | Status: AC
  Administered 2019-02-13: 1000 ug via INTRAMUSCULAR

## 2019-02-13 NOTE — Progress Notes (Signed)
Agree with documentation as above. Form completed andgiven to aptient.   [name], MD

## 2019-02-13 NOTE — Progress Notes (Signed)
Pt came into clinic today for B12 injection. Pt reports no negative side effects from medication. Denies any chest pain or palpitations. BP was low normal today, she will increase water intake. Tolerated B12 in RUOQ. No immediate complications. Advised to follow up in 1 month for B12. Verbalized understanding.  Pt does requested updated disability parking placard. New one signed by PCP.

## 2019-02-16 DIAGNOSIS — M5136 Other intervertebral disc degeneration, lumbar region: Secondary | ICD-10-CM | POA: Diagnosis not present

## 2019-02-16 DIAGNOSIS — M47816 Spondylosis without myelopathy or radiculopathy, lumbar region: Secondary | ICD-10-CM | POA: Diagnosis not present

## 2019-02-16 DIAGNOSIS — M5442 Lumbago with sciatica, left side: Secondary | ICD-10-CM | POA: Diagnosis not present

## 2019-02-16 DIAGNOSIS — G8929 Other chronic pain: Secondary | ICD-10-CM | POA: Diagnosis not present

## 2019-02-16 DIAGNOSIS — G5702 Lesion of sciatic nerve, left lower limb: Secondary | ICD-10-CM | POA: Diagnosis not present

## 2019-02-23 ENCOUNTER — Telehealth: Payer: Self-pay | Admitting: Cardiovascular Disease

## 2019-02-23 NOTE — Telephone Encounter (Signed)
Spoke with patient who confirmed all demographics. Patient has smart phone and uses My Chart. Will have vitals ready for visit. ° °

## 2019-03-06 ENCOUNTER — Other Ambulatory Visit: Payer: Self-pay

## 2019-03-06 ENCOUNTER — Encounter: Payer: Self-pay | Admitting: Cardiovascular Disease

## 2019-03-06 ENCOUNTER — Telehealth: Payer: Self-pay | Admitting: Family Medicine

## 2019-03-06 ENCOUNTER — Telehealth (INDEPENDENT_AMBULATORY_CARE_PROVIDER_SITE_OTHER): Payer: Medicare Other | Admitting: Cardiovascular Disease

## 2019-03-06 VITALS — BP 135/82 | HR 67 | Wt 178.0 lb

## 2019-03-06 DIAGNOSIS — Z7189 Other specified counseling: Secondary | ICD-10-CM | POA: Diagnosis not present

## 2019-03-06 DIAGNOSIS — E782 Mixed hyperlipidemia: Secondary | ICD-10-CM

## 2019-03-06 DIAGNOSIS — I10 Essential (primary) hypertension: Secondary | ICD-10-CM

## 2019-03-06 DIAGNOSIS — I471 Supraventricular tachycardia: Secondary | ICD-10-CM

## 2019-03-06 NOTE — Patient Instructions (Addendum)
Medication Instructions:  Your physician has recommended you make the following change in your medication:  STOP Lasix (Furosemide) STOP Kdur (Potassium Chloride) Continue to monitor your BP and call us if BP does not improve  If you need a refill on your cardiac medications before your next appointment, please call your pharmacy.    Lab work: Your physician recommends that you return for lab work in: 3 months on Thursday August 13. You may come into the office anytime between 7:30 am and 4:30 pm for your lab work.  You will need to FAST for this appointment - nothing to eat or drink after midnight the night before except water or black coffee. If this date does not work for your schedule, please call our office to reschedule.   Testing/Procedures: None Ordered    Follow-Up: At [facility], you and your health needs are our priority.  As part of our continuing mission to provide you with exceptional heart care, we have created designated Provider Care Teams.  These Care Teams include your primary Cardiologist (physician) and Advanced Practice Providers (APPs -  Physician Assistants and Nurse Practitioners) who all work together to provide you with the care you need, when you need it. You will need a follow up appointment in:  6 months.  Please call our office 2 months in advance to schedule this appointment.  You may see Mertie Moores, MD or one of the following Advanced Practice Providers on your designated Care Team: Richardson Dopp, PA-C Home, [place] . Daune Perch, NP

## 2019-03-06 NOTE — Telephone Encounter (Signed)
I still think it is quite high risk with the numbers of COVID out there and especially with travel where she will be stopping at restaurants and her gas stations and restrooms.  But certainly it is ultimately up to her.

## 2019-03-06 NOTE — Telephone Encounter (Signed)
Pt advised, she will hold on travel for now.

## 2019-03-06 NOTE — Telephone Encounter (Signed)
Pt called clinic to see if PCP feels it was safe for her to travel to [place] tomorrow via car. She would be traveling with people whom she has not been around so far. She would be gone 10-12 days. Will route.

## 2019-03-06 NOTE — Progress Notes (Signed)
Virtual Visit via Video Note   This visit type was conducted due to national recommendations for restrictions regarding the COVID-19 Pandemic (e.g. social distancing) in an effort to limit this patient's exposure and mitigate transmission in our community.  Due to her co-morbid illnesses, this patient is at least at moderate risk for complications without adequate follow up.  This format is felt to be most appropriate for this patient at this time.  All issues noted in this document were discussed and addressed.  A limited physical exam was performed with this format.  Please refer to the patient's chart for her consent to telehealth for [facility]   Date:  03/06/2019   ID:  Mia [name], DOB 07/16/1951, MRN [id number]  Patient Location: Home Provider Location: Home  PCP:  Hali Marry, MD  Cardiologist:  Mertie Moores, MD  Electrophysiologist:  None   Evaluation Performed:  Follow-Up Visit  Chief Complaint:   SVT   Mar 06, 2019    PCP:  Hali Marry, MD     Cardiologist:   Mertie Moores, MD      Chief Complaint  Patient presents with  . Hypertension   1. SVT 2. Raynaud's Phenomenon 3. Diabetes Mellitus 4. Minimal CAD by cath 2006 [name]) 5. Sleep apnea - CPAP is currently not working  02/11/05 Right heart catheterization:  RA pressure: 8 mmHg  RV pressure: 25/7 mmHg  PA pressure: 20/9 mmHg  PWCP: 9 mmHg  CO: 4.6 liters per minute  LEFT HEART CATHETERIZATION RESULTS:  1. Left main: No significant disease.  2. LAD: Mild luminal irregularities.  3. First and second diagonal: Moderate size, mild luminal irregularities.  4. Left circumflex: Nondominant, mild luminal irregularities.  5. RCA: Dominant with mild luminal irregularities.  6. LV: EF is 55 to 60%, no wall motion abnormalities. LV EDP was 9 mmHg.   December 28, 2014:  Mia [name] is a 68 y.o. female who presents for follow-up with an episode of chest pain. She was seen by  our nurse practitioner, Ignacia Bayley.    Diltiazem was DC'd.   She was started on Metoprolol 50 bid.   Myoview study was normal. She has normal left ventricle systolic function. She is going to the [facility] 3 times a week.  Doing well.     She is feeling much better.    Sept. 1, 2016:  Has had C diff for the past several months .  Doing well from a cardiac standpoint .  Has been fatigued.  Doing well with the metoprolol  Still has shortness of breath .   Nov. 3, 2017:    [name] is seen today with her husband, [name].   Last week she started having some heart facing .  HR was 187 (using the app on her phone )  Could feel it in her ears. Got a little dizzy.  Sat down and relaxed and the episode resolved.  Had another episode this past Sunday .  HR stayed elevated for 2 hours   Aug. 24, 2018: Mia [name] is seen today with husband [name].   Followed for HTN and hyperlipidemia  Has had some elevated diastolic BP readings . Eats some salty foods still - likes ham. Complains of leg pain / ache when she is standing .    Nov. 16, 2018  [name] was having more SVT, we have increased the metoprolol  Up to metoprolol 100 mg BID. She is much better controlled at this point Wearing  the event monitor   December 08, 2017:  Mia [name] is seen today for follow-up of her supraventricular tachycardia  We had increased her metoprolol to 100 mg BID  Has occasional palpitations  No CP ,  No severe dyspnea. Does has some DOE that is likely due to generalized deconditioning  Needs left knee replacement this summer. Needs carple tunnel surgery in both wrists.  Has episodes of diaphoresis   Mar 11, 2018: Mia [name] is seen today for follow-up of her supraventricular tachycardia.  We increased her metoprolol up to 150 mill grams twice a day during her last visit. Tolerating the metoprolol well  BP is still is elevated at times.     Eats lots of sandwiches, salami,  Her diet is "terrible"  According to  her . - eats snacks instead of real meals, sandwiches,   She is surprised that she hasnt lost any weight .   Nov. 4,, 2019:  Mia [name] is here follow-up with her supraventricular tachycardia and hypertension. Palpitations have improved . BP is still elevated.    Avoids salt for the most part  Still eats Nabs crackers regularly    Mar 06, 2019   Doing well Has started walking but she has developed an ortho issue. No CP or episodes of SVT .  Has been avoiding salt Has OSA,  Wears her CPAP    The patient does not have symptoms concerning for COVID-19 infection (fever, chills, cough, or new shortness of breath).  Social distancing Wears a mask    Past Medical History:  Diagnosis Date  . Anal fissure   . Anemia   . Arthritis   . Blood transfusion   . C. difficile colitis   . Chest pain    a. 01/2013 MV: EF 59%, no ischemia.  . Chronic Dyspnea    a. 01/2013 Echo: EF 60-65%, Gr 1 DD, PASP 1mmHg.  . Diabetes mellitus   . Fibromyalgia   . Gall stones   . GERD (gastroesophageal reflux disease)    gastritis  . Hemorrhoids   . Hypertension   . Interstitial cystitis   . MCI (mild cognitive impairment) 11/25/2017  . Memory difficulty 12/14/2013  . Neuromuscular disorder ([place])    sclerosis  . Osteoporosis   . Peptic ulcer   . PONV (postoperative nausea and vomiting)   . Pseudogout   . Raynaud phenomenon   . Rectal bleeding   . Sleep apnea    a. on cpap.  Marland Kitchen SVT (supraventricular tachycardia) ([place])   . Syncope 09/10/2015  . Thyroid disease    hypothyroidism  . Tremor, essential 05/14/2016   Past Surgical History:  Procedure Laterality Date  . APPENDECTOMY    . BLADDER SURGERY     x2  . BLADDER SURGERY    . BREAST BIOPSY    . CARDIAC CATHETERIZATION    . CARPAL TUNNEL RELEASE    . CATARACT EXTRACTION Bilateral   . CHOLECYSTECTOMY    . DILATION AND CURETTAGE OF UTERUS    . ENTEROCELE REPAIR     x2  . EYE SURGERY     retina  . hysterectomy - unknown type    .  JOINT REPLACEMENT    . KNEE ARTHROPLASTY    . KNEE SURGERY     x6  . NECK SURGERY     fusion  . OOPHORECTOMY    . QUADRICEPS REPAIR Right   . RECTOCELE REPAIR     x2  . SHOULDER ARTHROSCOPY WITH SUBACROMIAL DECOMPRESSION, ROTATOR CUFF  REPAIR AND BICEP TENDON REPAIR  10/06/2012   Procedure: SHOULDER ARTHROSCOPY WITH SUBACROMIAL DECOMPRESSION, ROTATOR CUFF REPAIR AND BICEP TENDON REPAIR;  Surgeon: Nita Sells, MD;  Location: [place];  Service: Orthopedics;  Laterality: Right;  Arthroscopic  Repair  of  Subscapularis, Open Biceps Tenodesis  . SHOULDER SURGERY     bilateral- bones spur  . TONSILLECTOMY    . TOTAL KNEE ARTHROPLASTY    . TOTAL SHOULDER ARTHROPLASTY       Current Meds  Medication Sig  . AMBULATORY NON FORMULARY MEDICATION Medication Name: One touch ultra strips. Check fasting blood sugar in the morning and as needed.  Dx - [id number] Fax to [phone]  . atorvastatin (LIPITOR) 40 MG tablet TAKE 1 TABLET EVERY DAY  . Cyanocobalamin (VITAMIN B-12 IJ) Inject as directed every 30 (thirty) days.  Marland Kitchen denosumab (PROLIA) 60 MG/ML SOLN injection Inject 60 mg into the skin every 6 (six) months. Administer in upper arm, thigh, or abdomen  . donepezil (ARICEPT) 10 MG tablet Take 1 tablet (10 mg total) by mouth at bedtime.  . fesoterodine (TOVIAZ) 8 MG TB24 tablet Take 8 mg by mouth daily.  . fluticasone (FLONASE) 50 MCG/ACT nasal spray Place 2 sprays into both nostrils daily. (Patient taking differently: Place 2 sprays into both nostrils as needed. )  . furosemide (LASIX) 20 MG tablet Take 1 tablet (20 mg total) by mouth daily.  Marland Kitchen gabapentin (NEURONTIN) 300 MG capsule TAKE 1 TO 2 CAPSULES BY MOUTH UP TO TWO TIMES DAILY AS NEEDED  . HYDROmorphone (DILAUDID) 2 MG tablet Take 2 mg by mouth daily as needed for moderate pain.   Marland Kitchen ibuprofen (ADVIL,MOTRIN) 800 MG tablet Take 1 tablet (800 mg total) by mouth daily with supper. (Patient taking differently: Take 800 mg  by mouth as needed. )  . leflunomide (ARAVA) 20 MG tablet TAKE 1 TABLET EVERY DAY  . Loratadine-Pseudoephedrine (CLARITIN-D 12 HOUR PO) Take by mouth as needed.  Marland Kitchen losartan (COZAAR) 50 MG tablet Take 1 tablet (50 mg total) by mouth daily.  . Melatonin 5 MG TABS Take 5 mg by mouth at bedtime.   . memantine (NAMENDA) 10 MG tablet TAKE 1 TABLET (10 MG TOTAL) 2 TIMES DAILY.  . metFORMIN (GLUCOPHAGE-XR) 500 MG 24 hr tablet TAKE 2 TABLETS EVERY DAY WITH BREAKFAST  . metoprolol tartrate (LOPRESSOR) 100 MG tablet TAKE 1 AND 1/2 TABLETS TWICE DAILY  . NON FORMULARY Place into the nose at bedtime. cpap machine  . ondansetron (ZOFRAN) 4 MG tablet TAKE 2 TABLETS BY MOUTH 2 TIMES A DAY AS NEEDED FOR NAUSEA FOR UP TO 4 DOSES.  . ONE TOUCH ULTRA TEST test strip USE TO TEST FASTING BLOOD SUGAR IN THE MORNING AND AS NEEDED  . pantoprazole (PROTONIX) 40 MG tablet Take 40 mg by mouth 2 (two) times daily.  . polyethylene glycol powder (MIRALAX) powder Take 17 g by mouth daily.  . potassium chloride SA (K-DUR,KLOR-CON) 20 MEQ tablet Take 1 tablet (20 mEq total) by mouth daily.  . primidone (MYSOLINE) 50 MG tablet TAKE 1 TABLET AT BEDTIME  . Probiotic Product (PROBIOTIC ADVANCED PO) Take 1 capsule by mouth daily.  Marland Kitchen venlafaxine XR (EFFEXOR-XR) 75 MG 24 hr capsule TAKE 1 CAPSULE BY MOUTH  DAILY WITH BREAKFAST     Allergies:   Codeine; Myrbetriq  [mirabegron]; Percocet [oxycodone-acetaminophen]; Celebrex [celecoxib]; Darvocet [propoxyphene n-acetaminophen]; Erythromycin; Hydrocodone; Lyrica [pregabalin]; Macrodantin [nitrofurantoin]; Toradol [ketorolac tromethamine]; Tramadol; and Verapamil   Social History   Tobacco Use  .  Smoking status: Never Smoker  . Smokeless tobacco: Never Used  Substance Use Topics  . Alcohol use: No    Alcohol/week: 0.0 standard drinks  . Drug use: No     Family Hx: The patient's family history includes Asthma in her father, mother, sister, and sister; Breast cancer in her maternal  grandmother; Colon polyps in her mother; Dementia in her sister; Diabetes in her mother; Heart attack in her father and mother; Heart disease in her father and mother; Hypertension in her sister; Lupus in her sister; Pancreatic cancer in her maternal grandfather; Rheum arthritis in her sister; Stroke in her mother and sister; Wilson's disease in her maternal grandmother.  ROS:   Please see the history of present illness.     All other systems reviewed and are negative.   Prior CV studies:   The following studies were reviewed today:    Labs/Other Tests and Data Reviewed:    EKG:  No ECG reviewed.  Recent Labs: 11/14/2018: TSH 1.33 12/08/2018: ALT 39; BUN 10; Creat 0.86; Hemoglobin 13.5; Platelets 510; Potassium 4.6; Sodium 138   Recent Lipid Panel Lab Results  Component Value Date/Time   CHOL 119 06/18/2017 09:51 AM   TRIG 161 (H) 06/18/2017 09:51 AM   HDL 40 06/18/2017 09:51 AM   CHOLHDL 3.0 06/18/2017 09:51 AM   CHOLHDL 3.2 08/28/2016 03:58 PM   LDLCALC 47 06/18/2017 09:51 AM    Wt Readings from Last 3 Encounters:  03/06/19 178 lb (80.7 kg)  01/12/19 179 lb (81.2 kg)  12/20/18 180 lb (81.6 kg)     Objective:    Vital Signs:  BP 135/82   Pulse 67   Wt 178 lb (80.7 kg)   BMI 28.73 kg/m    VITAL SIGNS:  reviewed GEN:  no acute distress EYES:  sclerae anicteric, EOMI - Extraocular Movements Intact RESPIRATORY:  normal respiratory effort, symmetric expansion CARDIOVASCULAR:  no peripheral edema SKIN:  no rash, lesions or ulcers. MUSCULOSKELETAL:  no obvious deformities. NEURO:  alert and oriented x 3, no obvious focal deficit PSYCH:  normal affect  ASSESSMENT & PLAN:    1. SVT -   No recent episodes of SVT.     2. HTN - BP has been low on occasion Has occasional episodes of orthostasis  BP was low at her primary medical doctors office Will hold Lasix and Kdur for now.    3. Hyperlipidemia _  No recent lipid levels.   Will check in 3 months   COVID-19  Education: The signs and symptoms of COVID-19 were discussed with the patient and how to seek care for testing (follow up with PCP or arrange E-visit).  The importance of social distancing was discussed today.  Time:   Today, I have spent  17  minutes with the patient with telehealth technology discussing the above problems.     Medication Adjustments/Labs and Tests Ordered: Current medicines are reviewed at length with the patient today.  Concerns regarding medicines are outlined above.   Tests Ordered: No orders of the defined types were placed in this encounter.   Medication Changes: No orders of the defined types were placed in this encounter.   Disposition:  Follow up in 6 month(s)  Signed, Mertie Moores, MD  03/06/2019 9:39 AM    [facility]

## 2019-03-08 ENCOUNTER — Telehealth: Payer: Self-pay | Admitting: Family Medicine

## 2019-03-08 ENCOUNTER — Other Ambulatory Visit: Payer: Self-pay | Admitting: *Deleted

## 2019-03-08 MED ORDER — LEFLUNOMIDE 20 MG PO TABS: 20.0000 mg | ORAL_TABLET | Freq: Every day | ORAL | 0 refills | Status: DC

## 2019-03-08 NOTE — Telephone Encounter (Signed)
Patient aware. Thank you

## 2019-03-08 NOTE — Telephone Encounter (Signed)
Patient is needing a refill on Arava and requesting it to be sent to the [facility] on [address] while waiting on a tier exception through her insurance.  Last Visit: 12/08/18 Next visit:  05/08/19 Labs: 12/08/18 Glucose is 104. ALT borderline elevated.   Okay to refill per Dr. [name]

## 2019-03-08 NOTE — Telephone Encounter (Signed)
That will be fine, she has to get labs done prior to Prolia administration. Lab order in.

## 2019-03-08 NOTE — Telephone Encounter (Signed)
Pt called. She has an appointment on May 18th with Dr Jerilynn Mages and wanted to know if she can get her prolia and b-12 on the same day. Thanks

## 2019-03-10 ENCOUNTER — Ambulatory Visit: Payer: Medicare Other

## 2019-03-13 ENCOUNTER — Telehealth: Payer: Self-pay | Admitting: Rheumatology

## 2019-03-13 ENCOUNTER — Ambulatory Visit (INDEPENDENT_AMBULATORY_CARE_PROVIDER_SITE_OTHER): Payer: Medicare Other | Admitting: Family Medicine

## 2019-03-13 ENCOUNTER — Other Ambulatory Visit: Payer: Self-pay | Admitting: *Deleted

## 2019-03-13 ENCOUNTER — Encounter: Payer: Self-pay | Admitting: Family Medicine

## 2019-03-13 VITALS — BP 122/65 | HR 72 | Ht 66.0 in | Wt 185.0 lb

## 2019-03-13 DIAGNOSIS — Z79899 Other long term (current) drug therapy: Secondary | ICD-10-CM

## 2019-03-13 DIAGNOSIS — M818 Other osteoporosis without current pathological fracture: Secondary | ICD-10-CM

## 2019-03-13 DIAGNOSIS — R7301 Impaired fasting glucose: Secondary | ICD-10-CM

## 2019-03-13 DIAGNOSIS — E538 Deficiency of other specified B group vitamins: Secondary | ICD-10-CM

## 2019-03-13 DIAGNOSIS — M8589 Other specified disorders of bone density and structure, multiple sites: Secondary | ICD-10-CM | POA: Diagnosis not present

## 2019-03-13 LAB — POCT GLYCOSYLATED HEMOGLOBIN (HGB A1C): Hemoglobin A1C: 5.9 % — AB (ref 4.0–5.6)

## 2019-03-13 MED ORDER — DENOSUMAB 60 MG/ML ~~LOC~~ SOSY
60.0000 mg | PREFILLED_SYRINGE | Freq: Once | SUBCUTANEOUS | Status: AC
  Administered 2019-03-13: 60 mg via SUBCUTANEOUS

## 2019-03-13 MED ORDER — TRIAMCINOLONE ACETONIDE 0.1 % EX CREA: 1.0000 "application " | TOPICAL_CREAM | Freq: Every day | CUTANEOUS | 0 refills | Status: DC

## 2019-03-13 MED ORDER — CYANOCOBALAMIN 1000 MCG/ML IJ SOLN
1000.0000 ug | Freq: Once | INTRAMUSCULAR | Status: AC
  Administered 2019-03-13: 1000 ug via INTRAMUSCULAR

## 2019-03-13 NOTE — Assessment & Plan Note (Signed)
Give B12 injection today.

## 2019-03-13 NOTE — Progress Notes (Signed)
Established Patient Office Visit  Subjective:  Patient ID: Mia [name], female    DOB: 1951/07/01  Age: 68 y.o. MRN: [id number]  CC:  Chief Complaint  Patient presents with  . ifg  . B12 Injection    HPI Mia [name] presents for B12 injection for pernicious anemia.  Reports she is also due for her Prolia injection for osteoporosis.  She did have her labs checked prior and is okay to receive the injection today.  She wanted me to take a look at the areas that we did cryotherapy on her legs.  She says the one on her face actually healed really well but the ones on her legs are still very darkly pigmented.  Wants to know if there is a cream that she could put on them that might help.  Impaired fasting glucose-no increased thirst or urination. No symptoms consistent with hypoglycemia.  She did want to let me know that she did see neurosurgery.  They have been doing some injections but did recommend that she consider using a walker.  She says she is been really resistant because it makes her feel old.   Past Medical History:  Diagnosis Date  . Anal fissure   . Anemia   . Arthritis   . Blood transfusion   . C. difficile colitis   . Chest pain    a. 01/2013 MV: EF 59%, no ischemia.  . Chronic Dyspnea    a. 01/2013 Echo: EF 60-65%, Gr 1 DD, PASP 30mmHg.  . Diabetes mellitus   . Fibromyalgia   . Gall stones   . GERD (gastroesophageal reflux disease)    gastritis  . Hemorrhoids   . Hypertension   . Interstitial cystitis   . MCI (mild cognitive impairment) 11/25/2017  . Memory difficulty 12/14/2013  . Neuromuscular disorder ([place])    sclerosis  . Osteoporosis   . Peptic ulcer   . PONV (postoperative nausea and vomiting)   . Pseudogout   . Raynaud phenomenon   . Rectal bleeding   . Sleep apnea    a. on cpap.  Marland Kitchen SVT (supraventricular tachycardia) ([place])   . Syncope 09/10/2015  . Thyroid disease    hypothyroidism  . Tremor, essential 05/14/2016    Past Surgical History:   Procedure Laterality Date  . APPENDECTOMY    . BLADDER SURGERY     x2  . BLADDER SURGERY    . BREAST BIOPSY    . CARDIAC CATHETERIZATION    . CARPAL TUNNEL RELEASE    . CATARACT EXTRACTION Bilateral   . CHOLECYSTECTOMY    . DILATION AND CURETTAGE OF UTERUS    . ENTEROCELE REPAIR     x2  . EYE SURGERY     retina  . hysterectomy - unknown type    . JOINT REPLACEMENT    . KNEE ARTHROPLASTY    . KNEE SURGERY     x6  . NECK SURGERY     fusion  . OOPHORECTOMY    . QUADRICEPS REPAIR Right   . RECTOCELE REPAIR     x2  . SHOULDER ARTHROSCOPY WITH SUBACROMIAL DECOMPRESSION, ROTATOR CUFF REPAIR AND BICEP TENDON REPAIR  10/06/2012   Procedure: SHOULDER ARTHROSCOPY WITH SUBACROMIAL DECOMPRESSION, ROTATOR CUFF REPAIR AND BICEP TENDON REPAIR;  Surgeon: Nita Sells, MD;  Location: [facility];  Service: Orthopedics;  Laterality: Right;  Arthroscopic  Repair  of  Subscapularis, Open Biceps Tenodesis  . SHOULDER SURGERY     bilateral- bones  spur  . TONSILLECTOMY    . TOTAL KNEE ARTHROPLASTY    . TOTAL SHOULDER ARTHROPLASTY      Family History  Problem Relation Age of Onset  . Heart attack Mother   . Stroke Mother   . Diabetes Mother   . Heart disease Mother   . Colon polyps Mother   . Asthma Mother   . Breast cancer Maternal Grandmother   . Wilson's disease Maternal Grandmother   . Pancreatic cancer Maternal Grandfather   . Heart disease Father   . Heart attack Father   . Asthma Father   . Stroke Sister   . Hypertension Sister   . Rheum arthritis Sister   . Dementia Sister   . Asthma Sister   . Lupus Sister   . Asthma Sister     Social History   Socioeconomic History  . Marital status: Married    Spouse name: Not on file  . Number of children: 2  . Years of education: 24  . Highest education level: Not on file  Occupational History  . Occupation: Retired  Scientific laboratory technician  . Financial resource strain: Not on file  . Food insecurity:     Worry: Not on file    Inability: Not on file  . Transportation needs:    Medical: Not on file    Non-medical: Not on file  Tobacco Use  . Smoking status: Never Smoker  . Smokeless tobacco: Never Used  Substance and Sexual Activity  . Alcohol use: No    Alcohol/week: 0.0 standard drinks  . Drug use: No  . Sexual activity: Not on file  Lifestyle  . Physical activity:    Days per week: Not on file    Minutes per session: Not on file  . Stress: Not on file  Relationships  . Social connections:    Talks on phone: Not on file    Gets together: Not on file    Attends religious service: Not on file    Active member of club or organization: Not on file    Attends meetings of clubs or organizations: Not on file    Relationship status: Not on file  . Intimate partner violence:    Fear of current or ex partner: Not on file    Emotionally abused: Not on file    Physically abused: Not on file    Forced sexual activity: Not on file  Other Topics Concern  . Not on file  Social History Narrative   Patient drinks about 3-4 cups of caffeine daily.   Patient is right handed.    Outpatient Medications Prior to Visit  Medication Sig Dispense Refill  . AMBULATORY NON FORMULARY MEDICATION Medication Name: One touch ultra strips. Check fasting blood sugar in the morning and as needed.  Dx - [id number] Fax to [phone] 50 strip 11  . atorvastatin (LIPITOR) 40 MG tablet TAKE 1 TABLET EVERY DAY 90 tablet 2  . Cyanocobalamin (VITAMIN B-12 IJ) Inject as directed every 30 (thirty) days.    Marland Kitchen denosumab (PROLIA) 60 MG/ML SOLN injection Inject 60 mg into the skin every 6 (six) months. Administer in upper arm, thigh, or abdomen    . donepezil (ARICEPT) 10 MG tablet Take 1 tablet (10 mg total) by mouth at bedtime. 90 tablet 3  . fesoterodine (TOVIAZ) 8 MG TB24 tablet Take 8 mg by mouth daily.    . fluticasone (FLONASE) 50 MCG/ACT nasal spray Place 2 sprays into both nostrils daily. (Patient taking  differently: Place 2 sprays into both nostrils as needed. ) 16 g 6  . gabapentin (NEURONTIN) 300 MG capsule TAKE 1 TO 2 CAPSULES BY MOUTH UP TO TWO TIMES DAILY AS NEEDED 180 capsule 3  . ibuprofen (ADVIL,MOTRIN) 800 MG tablet Take 1 tablet (800 mg total) by mouth daily with supper. (Patient taking differently: Take 800 mg by mouth as needed. ) 30 tablet 3  . leflunomide (ARAVA) 20 MG tablet Take 1 tablet (20 mg total) by mouth daily. 90 tablet 0  . Loratadine-Pseudoephedrine (CLARITIN-D 12 HOUR PO) Take by mouth as needed.    Marland Kitchen losartan (COZAAR) 50 MG tablet Take 1 tablet (50 mg total) by mouth daily. 90 tablet 3  . Melatonin 5 MG TABS Take 5 mg by mouth at bedtime.     . memantine (NAMENDA) 10 MG tablet TAKE 1 TABLET (10 MG TOTAL) 2 TIMES DAILY. 180 tablet 4  . metFORMIN (GLUCOPHAGE-XR) 500 MG 24 hr tablet TAKE 2 TABLETS EVERY DAY WITH BREAKFAST 180 tablet 1  . metoprolol tartrate (LOPRESSOR) 100 MG tablet TAKE 1 AND 1/2 TABLETS TWICE DAILY 270 tablet 3  . NON FORMULARY Place into the nose at bedtime. cpap machine    . ondansetron (ZOFRAN) 4 MG tablet TAKE 2 TABLETS BY MOUTH 2 TIMES A DAY AS NEEDED FOR NAUSEA FOR UP TO 4 DOSES. 20 tablet 0  . ONE TOUCH ULTRA TEST test strip USE TO TEST FASTING BLOOD SUGAR IN THE MORNING AND AS NEEDED 50 each 11  . pantoprazole (PROTONIX) 40 MG tablet Take 40 mg by mouth 2 (two) times daily.    . polyethylene glycol powder (MIRALAX) powder Take 17 g by mouth daily.    . primidone (MYSOLINE) 50 MG tablet TAKE 1 TABLET AT BEDTIME 90 tablet 1  . Probiotic Product (PROBIOTIC ADVANCED PO) Take 1 capsule by mouth daily.    Marland Kitchen venlafaxine XR (EFFEXOR-XR) 75 MG 24 hr capsule TAKE 1 CAPSULE BY MOUTH  DAILY WITH BREAKFAST 90 capsule 3  . HYDROmorphone (DILAUDID) 2 MG tablet Take 2 mg by mouth daily as needed for moderate pain.   0   No facility-administered medications prior to visit.     Allergies  Allergen Reactions  . Codeine Nausea And Vomiting  . Myrbetriq   [Mirabegron] Other (See Comments)    SEVERE HEADACHE  . Percocet [Oxycodone-Acetaminophen] Nausea And Vomiting  . Celebrex [Celecoxib] Other (See Comments)    Other reaction(s): Other flushed  . Darvocet [Propoxyphene N-Acetaminophen] Nausea And Vomiting  . Erythromycin Nausea And Vomiting    Nausea and vomiting   . Hydrocodone Nausea And Vomiting  . Lyrica [Pregabalin] Swelling    Legs swelling   . Macrodantin [Nitrofurantoin] Nausea And Vomiting  . Toradol [Ketorolac Tromethamine] Nausea And Vomiting    Per patient, only PO form causes nausea and vomiting. Can take injection without issue  . Tramadol Nausea And Vomiting  . Verapamil Nausea And Vomiting    REACTION: intolerance    ROS Review of Systems    Objective:    Physical Exam  BP 122/65   Pulse 72   Ht 5\' 6"  (1.676 m)   Wt 185 lb (83.9 kg)   SpO2 95%   BMI 29.86 kg/m  Wt Readings from Last 3 Encounters:  03/13/19 185 lb (83.9 kg)  03/06/19 178 lb (80.7 kg)  01/12/19 179 lb (81.2 kg)     Health Maintenance Due  Topic Date Due  . OPHTHALMOLOGY EXAM  01/28/2019    There  are no preventive care reminders to display for this patient.  Lab Results  Component Value Date   TSH 1.33 11/14/2018   Lab Results  Component Value Date   WBC 8.4 12/08/2018   HGB 13.5 12/08/2018   HCT 39.9 12/08/2018   MCV 93.4 12/08/2018   PLT 510 (H) 12/08/2018   Lab Results  Component Value Date   NA 138 12/08/2018   K 4.6 12/08/2018   CO2 29 12/08/2018   GLUCOSE 104 (H) 12/08/2018   BUN 10 12/08/2018   CREATININE 0.86 12/08/2018   BILITOT 0.5 12/08/2018   ALKPHOS 85 06/04/2017   AST 34 12/08/2018   ALT 39 (H) 12/08/2018   PROT 7.0 12/08/2018   ALBUMIN 4.3 06/04/2017   CALCIUM 10.2 12/08/2018   GFR 71.66 04/03/2015   Lab Results  Component Value Date   CHOL 119 06/18/2017   Lab Results  Component Value Date   HDL 40 06/18/2017   Lab Results  Component Value Date   LDLCALC 47 06/18/2017   Lab Results   Component Value Date   TRIG 161 (H) 06/18/2017   Lab Results  Component Value Date   CHOLHDL 3.0 06/18/2017   Lab Results  Component Value Date   HGBA1C 5.9 (A) 03/13/2019      Assessment & Plan:   Problem List Items Addressed This Visit      Endocrine   IFG (impaired fasting glucose) - Primary    A1c looks great today.  We will continue to monitor.  Continue to work on [employer] choices and staying as active as possible.  Follow-up in 6 months.      Relevant Orders   POCT glycosylated hemoglobin (Hb A1C) (Completed)     Musculoskeletal and Integument   Osteoporosis    Continue with Prolia.  Given injection today will need repeat in 6 months.  Taking adequate calcium and vitamin D.      Relevant Medications   denosumab (PROLIA) injection 60 mg (Completed)   Osteopenia   Relevant Medications   denosumab (PROLIA) injection 60 mg (Completed)     Other   B12 deficiency    Give B12 injection today.      Relevant Medications   cyanocobalamin ((VITAMIN B-12)) injection 1,000 mcg (Completed)     Skin lesions were we did cryotherapy.  Most of the lesions have a purplish discoloration to them on her inner thigh.  Just encourage her to give it time and it should improve on its own.  Did give her just a little bit of a topical steroid so that she is not picking or scratching at the areas.    Meds ordered this encounter  Medications  . cyanocobalamin ((VITAMIN B-12)) injection 1,000 mcg  . denosumab (PROLIA) injection 60 mg  . triamcinolone cream (KENALOG) 0.1 %    Sig: Apply 1 application topically at bedtime. Do not use more than 2 weeks.    Dispense:  30 g    Refill:  0    Follow-up: Return in about 6 months (around 09/13/2019) for glucose ans peolia.    [name], MD

## 2019-03-13 NOTE — Progress Notes (Signed)
Patient here today for Vitamin b12 injection. Patient has no complaints of lethargy, palpitations or problems with medication.  Pt tolerated well, injection given in LUOQ w/o any complications .  Prolia injection today.  Pt tolerated SQ injection into R  Arm well and without complications. Pt advised to schedule next injection in 6 months and to ensure CMP is done before injection.   Maryruth Eve, [name], CMA

## 2019-03-13 NOTE — Telephone Encounter (Signed)
Patient called stating she has an appointment to have labwork with Dr. Madilyn Fireman today and is requesting her labwork orders that Dr. [name] needs be sent.

## 2019-03-13 NOTE — Telephone Encounter (Signed)
Lab orders faxed.

## 2019-03-13 NOTE — Assessment & Plan Note (Signed)
A1c looks great today.  We will continue to monitor.  Continue to work on [employer] choices and staying as active as possible.  Follow-up in 6 months.

## 2019-03-13 NOTE — Assessment & Plan Note (Signed)
Continue with Prolia.  Given injection today will need repeat in 6 months.  Taking adequate calcium and vitamin D.

## 2019-03-14 DIAGNOSIS — Z79899 Other long term (current) drug therapy: Secondary | ICD-10-CM | POA: Diagnosis not present

## 2019-03-14 LAB — VITAMIN B12: Vitamin B-12: 438 pg/mL (ref 200–1100)

## 2019-03-15 ENCOUNTER — Encounter: Payer: Self-pay | Admitting: Pharmacist

## 2019-03-15 LAB — CBC WITH DIFFERENTIAL/PLATELET
Absolute Monocytes: 627 cells/uL (ref 200–950)
Basophils Absolute: 40 cells/uL (ref 0–200)
Basophils Relative: 0.6 %
Eosinophils Absolute: 132 cells/uL (ref 15–500)
Eosinophils Relative: 2 %
HCT: 37.6 % (ref 35.0–45.0)
Hemoglobin: 12.1 g/dL (ref 11.7–15.5)
Lymphs Abs: 1742 cells/uL (ref 850–3900)
MCH: 29.3 pg (ref 27.0–33.0)
MCHC: 32.2 g/dL (ref 32.0–36.0)
MCV: 91 fL (ref 80.0–100.0)
MPV: 10.9 fL (ref 7.5–12.5)
Monocytes Relative: 9.5 %
Neutro Abs: 4059 cells/uL (ref 1500–7800)
Neutrophils Relative %: 61.5 %
Platelets: 334 10*3/uL (ref 140–400)
RBC: 4.13 10*6/uL (ref 3.80–5.10)
RDW: 13.5 % (ref 11.0–15.0)
Total Lymphocyte: 26.4 %
WBC: 6.6 10*3/uL (ref 3.8–10.8)

## 2019-03-15 LAB — COMPLETE METABOLIC PANEL WITH GFR
AG Ratio: 1.7 (calc) (ref 1.0–2.5)
ALT: 34 U/L — ABNORMAL HIGH (ref 6–29)
AST: 39 U/L — ABNORMAL HIGH (ref 10–35)
Albumin: 4 g/dL (ref 3.6–5.1)
Alkaline phosphatase (APISO): 81 U/L (ref 37–153)
BUN: 9 mg/dL (ref 7–25)
CO2: 25 mmol/L (ref 20–32)
Calcium: 9.6 mg/dL (ref 8.6–10.4)
Chloride: 103 mmol/L (ref 98–110)
Creat: 0.88 mg/dL (ref 0.50–0.99)
GFR, Est African American: 79 mL/min/{1.73_m2} (ref >=60)
GFR, Est Non African American: 68 mL/min/{1.73_m2} (ref >=60)
Globulin: 2.4 g/dL (calc) (ref 1.9–3.7)
Glucose, Bld: 123 mg/dL — ABNORMAL HIGH (ref 65–99)
Potassium: 4.5 mmol/L (ref 3.5–5.3)
Sodium: 138 mmol/L (ref 135–146)
Total Bilirubin: 0.4 mg/dL (ref 0.2–1.2)
Total Protein: 6.4 g/dL (ref 6.1–8.1)

## 2019-03-15 NOTE — Progress Notes (Signed)
LFTs are mildly elevated.  We will continue to monitor.

## 2019-03-15 NOTE — Progress Notes (Signed)
Appeal Tier Exception denial per patient request.  Faxed to [facility] Department. Fax # [phone]

## 2019-03-16 NOTE — Telephone Encounter (Signed)
Appeal letter sent to [facility] after initial denial of tier exception.  Received fax notifying that it was again denied.  Called patient to notify that appeal was denied.  Patient verbalized understanding.  She is now using a GoodRx coupon which has made it more affordable.   All questions encouraged and answered.  Instructed patient to call with any further questions or concerns.  Mariella Saa, PharmD, [facility] Rheumatology Clinical Pharmacist  03/16/2019 10:22 AM

## 2019-03-18 ENCOUNTER — Other Ambulatory Visit: Payer: Self-pay | Admitting: Neurology

## 2019-03-21 DIAGNOSIS — G5702 Lesion of sciatic nerve, left lower limb: Secondary | ICD-10-CM | POA: Diagnosis not present

## 2019-03-21 DIAGNOSIS — M25451 Effusion, right hip: Secondary | ICD-10-CM | POA: Diagnosis not present

## 2019-03-21 DIAGNOSIS — M25452 Effusion, left hip: Secondary | ICD-10-CM | POA: Diagnosis not present

## 2019-03-21 MED ORDER — ATORVASTATIN 40 MG TAB
40 mg | ORAL_TABLET | ORAL | 3 refills | Status: DC
Start: 2019-03-21 — End: 2019-06-07

## 2019-03-23 ENCOUNTER — Other Ambulatory Visit: Payer: Self-pay | Admitting: Neurology

## 2019-03-24 ENCOUNTER — Encounter: Payer: Self-pay | Admitting: Physician Assistant

## 2019-03-24 ENCOUNTER — Ambulatory Visit (INDEPENDENT_AMBULATORY_CARE_PROVIDER_SITE_OTHER): Payer: Medicare Other | Admitting: Physician Assistant

## 2019-03-24 VITALS — BP 156/82 | HR 68 | Temp 98.7°F | Ht 66.0 in | Wt 182.0 lb

## 2019-03-24 DIAGNOSIS — H6123 Impacted cerumen, bilateral: Secondary | ICD-10-CM

## 2019-03-24 DIAGNOSIS — J029 Acute pharyngitis, unspecified: Secondary | ICD-10-CM | POA: Diagnosis not present

## 2019-03-24 DIAGNOSIS — H9201 Otalgia, right ear: Secondary | ICD-10-CM

## 2019-03-24 LAB — POCT RAPID STREP A (OFFICE): Rapid Strep A Screen: NEGATIVE

## 2019-03-24 MED ORDER — AMOXICILLIN-POT CLAVULANATE 875-125 MG PO TABS: 1.0000 | ORAL_TABLET | Freq: Two times a day (BID) | ORAL | 0 refills | Status: DC

## 2019-03-24 NOTE — Progress Notes (Addendum)
Subjective:    Patient ID: Mia [name], female    DOB: 25-Nov-1950, 68 y.o.   MRN: [id number]  HPI  Pt is a 68 yo female who presents to the clinic with ST and right ear pain for the last 4 days. She has not had any improvement. She has been using OTC sore throat remedies, tylenol, gargling with salt water. No trouble breathing, fever, cough. She has been chewing on ice. She also feels like her ears are "clogged". Overall her symptoms are worsening and now the weekend. She does not want to have to go to UC during [place] pandemic. She is starting to have some maxillary sinus pressure.   .. Active Ambulatory Problems    Diagnosis Date Noted  . 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 Chronic diastolic CHF (congestive heart failure) ([place]) 06/08/2008  . Allergic rhinitis 06/11/2008  . INTERSTITIAL CYSTITIS 06/08/2008  . OSA (obstructive sleep apnea) 04/24/2009  . EDEMA 08/25/2010  . Dyspnea 06/11/2008  . Nonspecific (abnormal) findings on radiological and other examination of body structure 08/25/2010  . COMPUTERIZED TOMOGRAPHY, CHEST, ABNORMAL 08/25/2010  . SVT (supraventricular tachycardia) ([place])   . Raynaud phenomenon   . Hypertension 03/11/2011  . Hemorrhoids, external, thrombosed 06/22/2011  . Memory difficulty 12/14/2013  . History of arthroplasty of right knee 01/31/2014  . IFG (impaired fasting glucose) 03/09/2014  . Osteopenia 03/09/2014  . Hemorrhoid 03/09/2014  . Retinal wrinkling, right eye 03/09/2014  . Fibromyalgia 03/09/2014  . GERD (gastroesophageal reflux disease) 03/09/2014  . Benign head tremor 06/11/2014  . Lumbar degenerative disc disease 06/11/2014  . DOE (dyspnea on exertion)   . Primary osteoarthritis of right knee 08/13/2014  . Aortic atherosclerosis ([employer]) 04/01/2015  . B12 deficiency 09/10/2015  . Syncope 09/10/2015  . Cervical facet joint syndrome 09/10/2015  . Chronic fatigue 09/10/2015  . Right foot pain 04/14/2016  . Tremor, essential  05/14/2016  . Inflammatory arthritis 09/30/2016  . High risk medication use 09/30/2016  . History of Clostridium difficile colitis 09/30/2016  . 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 Pseudogout 09/30/2016  . DDD cervical spine status post fusion 09/30/2016  . DDD thoracic spine 09/30/2016  . H/O total knee replacement, right 09/30/2016  . Age-related osteoporosis without current pathological fracture 09/30/2016  . Stress fracture of left tibia 11/05/2016  . Trochanteric bursitis of both hips 02/24/2017  . Osteoporosis 04/18/2017  . Mixed hyperlipidemia 06/18/2017  . Restrictive lung disease 07/30/2017  . MCI (mild cognitive impairment) 11/25/2017  . Facet arthritis of lumbar region 05/10/2018  . Hypokalemia 05/10/2018  . Rectocele 04/26/2017  . Irritable bowel syndrome with both constipation and diarrhea 04/26/2017   Resolved Ambulatory Problems    Diagnosis Date Noted  . PSEUDOGOUT 06/11/2008  . OSTEOARTHRITIS 06/11/2008  . Sleep apnea   . Chest tightness 01/24/2013  . Abnormality of gait 12/14/2013  . IC (interstitial cystitis) 03/09/2014  . SOB (shortness of breath) 06/11/2014  . Shortness of breath   . Chest pain   . Knee pain, left 02/27/2015  . Syncope and collapse 09/10/2015  . Foreign body of hand, right 02/10/2016  . DDD lumbar spine 09/30/2016   Past Medical History:  Diagnosis Date  . Anal fissure   . Anemia   . Arthritis   . Blood transfusion   . C. difficile colitis   . Diabetes mellitus   . Gall stones   . Hemorrhoids   . Interstitial cystitis   . Neuromuscular disorder ([place])   . Peptic ulcer   .  PONV (postoperative nausea and vomiting)   . Rectal bleeding   . Thyroid disease       Review of Systems See HPI.     Objective:   Physical Exam Vitals signs reviewed.  Constitutional:      Appearance: She is well-developed.  HENT:     Head: Normocephalic.     Ears:     Comments: Bilateral impacted cerumen.  After irrigation right  tM erythematous.     Nose: Congestion present.     Mouth/Throat:     Pharynx: Posterior oropharyngeal erythema present. No oropharyngeal exudate or uvula swelling.     Tonsils: No tonsillar exudate or tonsillar abscesses.  Eyes:     Conjunctiva/sclera: Conjunctivae normal.  Cardiovascular:     Rate and Rhythm: Normal rate and regular rhythm.  Pulmonary:     Effort: Pulmonary effort is normal.     Breath sounds: Normal breath sounds.  Neurological:     General: No focal deficit present.     Mental Status: She is alert.  Psychiatric:        Mood and Affect: Mood normal.        Behavior: Behavior normal.           Assessment & Plan:  Marland KitchenMarland KitchenLucia was seen today for sore throat.  Diagnoses and all orders for this visit:  Sore throat -     POCT rapid strep A -     amoxicillin-clavulanate (AUGMENTIN) 875-125 MG tablet; Take 1 tablet by mouth 2 (two) times daily.  Right ear pain  Bilateral impacted cerumen    Results for orders placed or performed in visit on 03/24/19  POCT rapid strep A  Result Value Ref Range   Rapid Strep A Screen Negative Negative   It is the weekend. Pt is on day 5 with no relief of ST. Would like for patient to give conservative treatment 24 more hours and then start augmentin. She is starting to have some sinus pressure and could be sinus infection forming.  I do think her ear pain could be due to some ETD. We did irrigate her ears out today. Start flonase daily. Follow up as needed.   Marland Kitchen.Cerumen Removal Template: Indication: Cerumen impaction of the ear(s) Medical necessity statement: On physical examination, cerumen impairs clinically significant portions of the external auditory canal, and tympanic membrane. Noted obstructive, copious cerumen that cannot be removed without magnification and instrumentations requiring physician skills Consent: Discussed benefits and risks of procedure and verbal consent obtained Procedure: Patient was prepped for the  procedure. Utilized an otoscope to assess and take note of the ear canal, the tympanic membrane, and the presence, amount, and placement of the cerumen. Gentle water irrigation and soft plastic curette was utilized to remove cerumen.  Post procedure examination shows cerumen was completely removed. Patient tolerated procedure well. The patient is made aware that they may experience temporary vertigo, temporary hearing loss, and temporary discomfort. If these symptom last for more than 24 hours to call the clinic or proceed to the ED.

## 2019-03-28 DIAGNOSIS — Z20828 Contact with and (suspected) exposure to other viral communicable diseases: Secondary | ICD-10-CM | POA: Diagnosis not present

## 2019-03-28 DIAGNOSIS — U071 COVID-19: Secondary | ICD-10-CM | POA: Diagnosis not present

## 2019-04-04 ENCOUNTER — Telehealth: Payer: Self-pay

## 2019-04-04 NOTE — Telephone Encounter (Signed)
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 I was not able to locate the form up front.  Do you know anything about this form?

## 2019-04-04 NOTE — Telephone Encounter (Signed)
Can you check [name]'s desk. I haven't seen it. I am here this afternoon. If you can find it I can sign it.

## 2019-04-04 NOTE — Telephone Encounter (Signed)
Spoke with Kenney Houseman, she has forms and is contacting patient.

## 2019-04-05 NOTE — Telephone Encounter (Signed)
Called and advised pt that we will need to have the rest of her paperwork to complete this. She stated that she will bring this by .[name], [place]

## 2019-04-10 ENCOUNTER — Ambulatory Visit: Payer: Medicare Other

## 2019-04-10 DIAGNOSIS — G5702 Lesion of sciatic nerve, left lower limb: Secondary | ICD-10-CM | POA: Diagnosis not present

## 2019-04-11 DIAGNOSIS — E119 Type 2 diabetes mellitus without complications: Secondary | ICD-10-CM | POA: Diagnosis not present

## 2019-04-11 DIAGNOSIS — H16223 Keratoconjunctivitis sicca, not specified as Sjogren's, bilateral: Secondary | ICD-10-CM | POA: Diagnosis not present

## 2019-04-11 DIAGNOSIS — H35371 Puckering of macula, right eye: Secondary | ICD-10-CM | POA: Diagnosis not present

## 2019-04-11 LAB — HM DIABETES EYE EXAM

## 2019-04-14 ENCOUNTER — Ambulatory Visit (INDEPENDENT_AMBULATORY_CARE_PROVIDER_SITE_OTHER): Payer: Medicare Other | Admitting: Sports Medicine

## 2019-04-14 VITALS — BP 141/71 | HR 71 | Ht 66.0 in | Wt 180.0 lb

## 2019-04-14 DIAGNOSIS — D510 Vitamin B12 deficiency anemia due to intrinsic factor deficiency: Secondary | ICD-10-CM | POA: Diagnosis not present

## 2019-04-14 MED ORDER — CYANOCOBALAMIN 1000 MCG/ML IJ SOLN
1000.0000 ug | Freq: Once | INTRAMUSCULAR | Status: AC
  Administered 2019-04-14: 1000 ug via INTRAMUSCULAR

## 2019-04-14 NOTE — Progress Notes (Signed)
   Subjective:    Patient ID: Mia [name], female    DOB: 11-30-1950, 68 y.o.   MRN: [id number]  HPI Patient is here for a Vitamin B12 injection. Denied any gastrointestional problems or dizziness.    Review of Systems     Objective:   Physical Exam        Assessment & Plan:  Patient tolerated injection well without complication. Patient advised to schedule next injection in 30 days

## 2019-04-24 NOTE — Progress Notes (Signed)
Office Visit Note  Patient: Mia [name]             Date of Birth: 11/10/1950           MRN: [id number]             PCP: Hali Marry, MD Referring: Hali Marry, * Visit Date: 05/08/2019 Occupation: @GUAROCC @  Subjective:  Chronic pain in both knee joints    History of Present Illness: Mia [name] is a 68 y.o. female with history of seronegative rheumatoid arthritis, osteoarthritis, and fibromyalgia.  Patient is taking Arava 20 mg 1 tablet by mouth daily.  Patient reports that about 2 to 3 months ago she was out of [place] for about 3 weeks.  She states that during that time she had increased deafness and discomfort in bilateral hands.  Since resuming her [place] she has not had any flares.  She experiences chronic pain in bilateral knee joints.  She has been having increased left CMC joint pain.  She denies any joint swelling at this time.  She states that she has recently been helping her daughter back to move to [place] which is been causing some increased fibromyalgia flares.  She is experiencing trapezius muscle tension and muscle tenderness bilaterally.  She has intermittent fatigue but has been sleeping well overall. She has a repeat DEXA scheduled in August 2020.    Activities of Daily Living:  Patient reports morning stiffness for 5-10 minutes.   Patient Denies nocturnal pain.  Difficulty dressing/grooming: Denies Difficulty climbing stairs: Reports Difficulty getting out of chair: Reports Difficulty using hands for taps, buttons, cutlery, and/or writing: Denies  Review of Systems  Constitutional: Negative for fatigue.  HENT: Positive for mouth dryness. Negative for mouth sores and nose dryness.   Eyes: Negative for pain, itching, visual disturbance and dryness.  Respiratory: Negative for cough, hemoptysis, shortness of breath, wheezing and difficulty breathing.   Cardiovascular: Negative for chest pain, palpitations, hypertension and swelling in legs/feet.   Gastrointestinal: Negative for abdominal pain, blood in stool, constipation and diarrhea.  Endocrine: Negative for increased urination.  Genitourinary: Negative for painful urination and pelvic pain.  Musculoskeletal: Positive for arthralgias, joint pain, joint swelling and morning stiffness. Negative for myalgias, muscle weakness, muscle tenderness and myalgias.  Skin: Negative for color change, pallor, rash, hair loss, nodules/bumps, redness, skin tightness, ulcers and sensitivity to sunlight.  Allergic/Immunologic: Negative for susceptible to infections.  Neurological: Positive for headaches. Negative for dizziness, light-headedness, numbness, memory loss and weakness.  Hematological: Negative for swollen glands.  Psychiatric/Behavioral: Negative for depressed mood, confusion and sleep disturbance. The patient is not nervous/anxious.     PMFS History:  Patient Active Problem List   Diagnosis Date Noted   Facet arthritis of lumbar region 05/10/2018   Hypokalemia 05/10/2018   MCI (mild cognitive impairment) 11/25/2017   Restrictive lung disease 07/30/2017   Mixed hyperlipidemia 06/18/2017   Rectocele 04/26/2017   Irritable bowel syndrome with both constipation and diarrhea 04/26/2017   Osteoporosis 04/18/2017   Trochanteric bursitis of both hips 02/24/2017   Stress fracture of left tibia 11/05/2016   Inflammatory arthritis 09/30/2016   High risk medication use 09/30/2016   History of Clostridium difficile colitis 09/30/2016   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   Pseudogout 09/30/2016   DDD cervical spine status post fusion 09/30/2016   DDD thoracic spine 09/30/2016   H/O total knee replacement, right 09/30/2016   Age-related osteoporosis without current pathological fracture 09/30/2016   Tremor, essential 05/14/2016  Right foot pain 04/14/2016   B12 deficiency 09/10/2015   Syncope 09/10/2015   Cervical facet joint syndrome 09/10/2015    Chronic fatigue 09/10/2015   Aortic atherosclerosis ([place]) 04/01/2015   DOE (dyspnea on exertion)    Primary osteoarthritis of right knee 08/13/2014   Benign head tremor 06/11/2014   Lumbar degenerative disc disease 06/11/2014   IFG (impaired fasting glucose) 03/09/2014   Osteopenia 03/09/2014   Hemorrhoid 03/09/2014   Retinal wrinkling, right eye 03/09/2014   Fibromyalgia 03/09/2014   GERD (gastroesophageal reflux disease) 03/09/2014   History of arthroplasty of right knee 01/31/2014   Memory difficulty 12/14/2013   Hemorrhoids, external, thrombosed 06/22/2011   Hypertension 03/11/2011   SVT (supraventricular tachycardia) ([place])    Raynaud phenomenon    EDEMA 08/25/2010   Nonspecific (abnormal) findings on radiological and other examination of body structure 08/25/2010   COMPUTERIZED TOMOGRAPHY, CHEST, ABNORMAL 08/25/2010   OSA (obstructive sleep apnea) 04/24/2009   Allergic rhinitis 06/11/2008   Dyspnea 06/11/2008   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   Chronic diastolic CHF (congestive heart failure) ([place]) 06/08/2008   INTERSTITIAL CYSTITIS 06/08/2008    Past Medical History:  Diagnosis Date   Anal fissure    Anemia    Arthritis    Blood transfusion    C. difficile colitis    Chest pain    a. 01/2013 MV: EF 59%, no ischemia.   Chronic Dyspnea    a. 01/2013 Echo: EF 60-65%, Gr 1 DD, PASP 21mmHg.   Diabetes mellitus    Fibromyalgia    Gall stones    GERD (gastroesophageal reflux disease)    gastritis   Hemorrhoids    Hypertension    Interstitial cystitis    MCI (mild cognitive impairment) 11/25/2017   Memory difficulty 12/14/2013   Neuromuscular disorder (HCC)    sclerosis   Osteoporosis    Peptic ulcer    PONV (postoperative nausea and vomiting)    Pseudogout    Raynaud phenomenon    Rectal bleeding    Sleep apnea    a. on cpap.   SVT (supraventricular tachycardia) (HCC)    Syncope 09/10/2015     Thyroid disease    hypothyroidism   Tremor, essential 05/14/2016    Family History  Problem Relation Age of Onset   Heart attack Mother    Stroke Mother    Diabetes Mother    Heart disease Mother    Colon polyps Mother    Asthma Mother    Breast cancer Maternal Grandmother    Wilson's disease Maternal Grandmother    Pancreatic cancer Maternal Grandfather    Heart disease Father    Heart attack Father    Asthma Father    Stroke Sister    Hypertension Sister    Rheum arthritis Sister    Dementia Sister    Asthma Sister    Lupus Sister    Asthma Sister    Past Surgical History:  Procedure Laterality Date   APPENDECTOMY     BLADDER SURGERY     x2   BLADDER SURGERY     BREAST BIOPSY     CARDIAC CATHETERIZATION     CARPAL TUNNEL RELEASE     CATARACT EXTRACTION Bilateral    CHOLECYSTECTOMY     DILATION AND CURETTAGE OF UTERUS     ENTEROCELE REPAIR     x2   EYE SURGERY     retina   hysterectomy - unknown type     JOINT REPLACEMENT  KNEE ARTHROPLASTY     KNEE SURGERY     x6   NECK SURGERY     fusion   OOPHORECTOMY     QUADRICEPS REPAIR Right    RECTOCELE REPAIR     x2   SHOULDER ARTHROSCOPY WITH SUBACROMIAL DECOMPRESSION, ROTATOR CUFF REPAIR AND BICEP TENDON REPAIR  10/06/2012   Procedure: SHOULDER ARTHROSCOPY WITH SUBACROMIAL DECOMPRESSION, ROTATOR CUFF REPAIR AND BICEP TENDON REPAIR;  Surgeon: Nita Sells, MD;  Location: [facility];  Service: Orthopedics;  Laterality: Right;  Arthroscopic  Repair  of  Subscapularis, Open Biceps Tenodesis   SHOULDER SURGERY     bilateral- bones spur   TONSILLECTOMY     TOTAL KNEE ARTHROPLASTY     TOTAL SHOULDER ARTHROPLASTY     Social History   Social History Narrative   Patient drinks about 3-4 cups of caffeine daily.   Patient is right handed.   Immunization History  Administered Date(s) Administered   Influenza Split 07/26/2012   Influenza  Whole 07/30/2009, 08/19/2010   Influenza, High Dose Seasonal PF 07/06/2016, 07/05/2017   Influenza,inj,Quad PF,6+ Mos 06/11/2014, 07/25/2015, 06/23/2018   Influenza-Unspecified 09/05/2013   Pneumococcal Conjugate-13 01/04/2017   Pneumococcal Polysaccharide-23 07/30/2009, 11/14/2018   Tdap 02/13/2008, 05/12/2018   Zoster 09/01/2011     Objective: Vital Signs: BP 102/71 (BP Location: Left Arm, Patient Position: Sitting, Cuff Size: Normal)    Pulse 64    Resp 13    Ht 5\' 6"  (1.676 m)    Wt 180 lb (81.6 kg)    BMI 29.05 kg/m    Physical Exam Vitals signs and nursing note reviewed.  Constitutional:      Appearance: She is well-developed.  HENT:     Head: Normocephalic and atraumatic.  Eyes:     Conjunctiva/sclera: Conjunctivae normal.  Neck:     Musculoskeletal: Normal range of motion.  Cardiovascular:     Rate and Rhythm: Normal rate and regular rhythm.     Heart sounds: Normal heart sounds.  Pulmonary:     Effort: Pulmonary effort is normal.     Breath sounds: Normal breath sounds.  Abdominal:     General: Bowel sounds are normal.     Palpations: Abdomen is soft.  Lymphadenopathy:     Cervical: No cervical adenopathy.  Skin:    General: Skin is warm and dry.     Capillary Refill: Capillary refill takes less than 2 seconds.  Neurological:     Mental Status: She is alert and oriented to person, place, and time.  Psychiatric:        Behavior: Behavior normal.      Musculoskeletal Exam: C-spine limited ROM with discomfort. Trapezius muscle tension and tenderness. Thoracic spine and lumbar spine limited ROM.  No midline spinal tenderness.  No SI joint tenderness.  Shoulder joints, elbow joints, wrist joints, MCPs, PIPs, and DIPs good ROM with no synovitis.  PIP and DIP synovial thickening.  Left CMC joint tenderness.  Tenderness of the right 1st MCP joint.  Hip joints, knee joints, ankle joints, MTPs, PIPs, and DIPs good good ROM.  Right knee replacement has good ROM with  no warmth or effusion.  Left knee crepitus, no warmth or effusion. PIP and DIP synovial thickening consistent with osteoarthritis of both feet.   CDAI Exam: CDAI Score: 0.8  Patient Global: 4 mm; Provider Global: 4 mm Swollen: 0 ; Tender: 0  Joint Exam   No joint exam has been documented for this visit   There  is currently no information documented on the homunculus. Go to the Rheumatology activity and complete the homunculus joint exam.  Investigation: No additional findings.  Imaging: No results found.  Recent Labs: Lab Results  Component Value Date   WBC 6.6 03/14/2019   HGB 12.1 03/14/2019   PLT 334 03/14/2019   NA 138 03/14/2019   K 4.5 03/14/2019   CL 103 03/14/2019   CO2 25 03/14/2019   GLUCOSE 123 (H) 03/14/2019   BUN 9 03/14/2019   CREATININE 0.88 03/14/2019   BILITOT 0.4 03/14/2019   ALKPHOS 85 06/04/2017   AST 39 (H) 03/14/2019   ALT 34 (H) 03/14/2019   PROT 6.4 03/14/2019   ALBUMIN 4.3 06/04/2017   CALCIUM 9.6 03/14/2019   GFRAA 79 03/14/2019    Speciality Comments: No specialty comments available.  Procedures:  No procedures performed Allergies: Codeine, Myrbetriq  [mirabegron], Percocet [oxycodone-acetaminophen], Celebrex [celecoxib], Darvocet [propoxyphene n-acetaminophen], Erythromycin, Hydrocodone, Lyrica [pregabalin], Macrodantin [nitrofurantoin], Toradol [ketorolac tromethamine], Tramadol, and Verapamil     Assessment / Plan:     Visit Diagnoses: Seronegative rheumatoid arthritis - She has no synovitis on exam. She has not had any recent rheumatoid arthritis flares.  She is clinically doing well on Arava 20 mg 1 tablet by mouth daily.  She has tenderness of the right 1st MCP joint but no synovitis was noted.  She was out of her prescription for [place] for 3 weeks 2-3 months ago, and she was having increased pain and stiffness in both hands.  She resumed [place] and has been taking it as prescribed and has not had any recent flares.  She will continue  on this current treatment regimen.  She does not need any refills at this time.  She was advised to notify us if she develops increased joint pain or joint swelling. She will follow up in 5 months.   High risk medication use - Arava 20 mg 1 tablet daily.  Last CBC/CMP within normal limits except for elevated LFTs on 03/14/2019. She was advised to avoid NSAIDs, tylenol, and alcohol.   Due for CBC/CMP in August and will monitor every 3 months.  Standing orders are in place.  She received the flu vaccine in August and previously Zostavax, Prevnar 13, and Pneumovax 23.  She was advised to hold [place] if she develops signs or symptoms of an infection and to resume once the infection has cleared.  We discussed the importance of social distancing and following the standard precautions recommended by the CDC.   Raynaud's phenomenon without gangrene -She has intermittent symptoms of Raynaud's.  No digital ulcerations or signs of gangrene.   Primary osteoarthritis of left knee - Chronic pain.  No warmth or effusion.  She has good ROM with no discomfort. She has left knee joint crepitus.   H/O total knee replacement, right - Chronic pain.  She has good ROM with no discomfort at this time.  No warmth or effusion noted.   Pseudogout - No recent flares.   DDD (degenerative disc disease), cervical - She has limited ROM with some discomfort.  She has no symptoms of radiculopathy at this time. She has no symptoms of radiculopathy at this time.   DDD (degenerative disc disease), thoracic - No midline spinal tenderness.   DDD (degenerative disc disease), lumbar - She follows up with Dr. Francesco Runner and Dr. Tobias Alexander.  She has had facet jont injections and a nerve ablation in the past.   Fibromyalgia -She has generalized muscle aches and muscle tenderness  due to fibromyalgia.  She is having trapezius muscle tension and muscle tenderness bilaterally.  She has an helping her daughter pack to move to [place] recently which  she feels has been exacerbating her fibromyalgia.  She states that she has intermittent fatigue but has been sleeping well at night.  She was encouraged to stay active and exercise on a regular basis  Other osteoporosis without current pathological fracture - She is on Prolia 60 mg injection every 6 months managed by her PCP.  Last injection 03/13/2019.  Previously treated with Fosamax for unknown duration.  Last DEXA June 2018 showed T score of -2.9 and BMD 0.532 at right hip.  Also -7% change in BMD at left hip.  Due for repeat DEXA.  She has repeat DEXA scheduled in August 2020.   S/P carpal tunnel release - Left, Dr. Grandville Silos    Chronic fatigue - She has intermittent fatigue. She has been sleeping well at night.   Other medical conditions are listed as follows:   IC (interstitial cystitis)  History of CHF (congestive heart failure)  History of hypertension   History of Clostridium difficile colitis  History of gastroesophageal reflux (GERD)  Orders: No orders of the defined types were placed in this encounter.  No orders of the defined types were placed in this encounter.     Follow-Up Instructions: Return in about 5 months (around 10/08/2019) for Rheumatoid arthritis, Osteoarthritis, Fibromyalgia.   [name], PA-C  Note - This record has been created using Dragon software.  Chart creation errors have been sought, but may not always  have been located. Such creation errors do not reflect on  the standard of medical care.

## 2019-04-29 ENCOUNTER — Other Ambulatory Visit: Payer: Self-pay | Admitting: Family Medicine

## 2019-05-04 ENCOUNTER — Encounter: Payer: Self-pay | Admitting: Family Medicine

## 2019-05-08 ENCOUNTER — Other Ambulatory Visit: Payer: Self-pay

## 2019-05-08 ENCOUNTER — Encounter: Payer: Self-pay | Admitting: Physician Assistant

## 2019-05-08 ENCOUNTER — Ambulatory Visit (INDEPENDENT_AMBULATORY_CARE_PROVIDER_SITE_OTHER): Payer: Medicare Other | Admitting: Physician Assistant

## 2019-05-08 VITALS — BP 102/71 | HR 64 | Resp 13 | Ht 66.0 in | Wt 180.0 lb

## 2019-05-08 DIAGNOSIS — M5136 Other intervertebral disc degeneration, lumbar region: Secondary | ICD-10-CM

## 2019-05-08 DIAGNOSIS — G5702 Lesion of sciatic nerve, left lower limb: Secondary | ICD-10-CM | POA: Diagnosis not present

## 2019-05-08 DIAGNOSIS — I7300 Raynaud's syndrome without gangrene: Secondary | ICD-10-CM | POA: Diagnosis not present

## 2019-05-08 DIAGNOSIS — G8929 Other chronic pain: Secondary | ICD-10-CM | POA: Diagnosis not present

## 2019-05-08 DIAGNOSIS — Z79899 Other long term (current) drug therapy: Secondary | ICD-10-CM | POA: Diagnosis not present

## 2019-05-08 DIAGNOSIS — M818 Other osteoporosis without current pathological fracture: Secondary | ICD-10-CM | POA: Diagnosis not present

## 2019-05-08 DIAGNOSIS — M797 Fibromyalgia: Secondary | ICD-10-CM

## 2019-05-08 DIAGNOSIS — N3010 Interstitial cystitis (chronic) without hematuria: Secondary | ICD-10-CM

## 2019-05-08 DIAGNOSIS — M0609 Rheumatoid arthritis without rheumatoid factor, multiple sites: Secondary | ICD-10-CM | POA: Diagnosis not present

## 2019-05-08 DIAGNOSIS — R5382 Chronic fatigue, unspecified: Secondary | ICD-10-CM

## 2019-05-08 DIAGNOSIS — M1712 Unilateral primary osteoarthritis, left knee: Secondary | ICD-10-CM

## 2019-05-08 DIAGNOSIS — Z96651 Presence of right artificial knee joint: Secondary | ICD-10-CM | POA: Diagnosis not present

## 2019-05-08 DIAGNOSIS — Z9889 Other specified postprocedural states: Secondary | ICD-10-CM

## 2019-05-08 DIAGNOSIS — M5134 Other intervertebral disc degeneration, thoracic region: Secondary | ICD-10-CM

## 2019-05-08 DIAGNOSIS — M1120 Other chondrocalcinosis, unspecified site: Secondary | ICD-10-CM

## 2019-05-08 DIAGNOSIS — Z8619 Personal history of other infectious and parasitic diseases: Secondary | ICD-10-CM

## 2019-05-08 DIAGNOSIS — M5030 Other cervical disc degeneration, unspecified cervical region: Secondary | ICD-10-CM

## 2019-05-08 DIAGNOSIS — Z8679 Personal history of other diseases of the circulatory system: Secondary | ICD-10-CM

## 2019-05-08 DIAGNOSIS — M47816 Spondylosis without myelopathy or radiculopathy, lumbar region: Secondary | ICD-10-CM | POA: Diagnosis not present

## 2019-05-08 DIAGNOSIS — Z8719 Personal history of other diseases of the digestive system: Secondary | ICD-10-CM

## 2019-05-08 DIAGNOSIS — M5442 Lumbago with sciatica, left side: Secondary | ICD-10-CM | POA: Diagnosis not present

## 2019-05-08 NOTE — Patient Instructions (Signed)
Standing Labs We placed an order today for your standing lab work.    Please come back and get your standing labs in August and every 3 months  We have open lab daily Monday through Thursday from 8:30-12:30 PM and 1:30-4:30 PM and Friday from 8:30-12:30 PM and 1:30 -4:00 PM at the office of Dr. Shaili Deveshwar.   You may experience shorter wait times on Monday and Friday afternoons. The office is located at [address], [address], [place], [place] [address] No appointment is necessary.   Labs are drawn by [facility].  You may receive a bill from [facility] for your lab work.  If you wish to have your labs drawn at another location, please call the office 24 hours in advance to send orders.  If you have any questions regarding directions or hours of operation,  please call [phone].   Just as a reminder please drink plenty of water prior to coming for your lab work. Thanks!   

## 2019-05-12 ENCOUNTER — Other Ambulatory Visit: Payer: Self-pay

## 2019-05-12 ENCOUNTER — Ambulatory Visit (INDEPENDENT_AMBULATORY_CARE_PROVIDER_SITE_OTHER): Payer: Medicare Other | Admitting: Family Medicine

## 2019-05-12 VITALS — BP 121/66 | HR 67 | Wt 183.0 lb

## 2019-05-12 DIAGNOSIS — D510 Vitamin B12 deficiency anemia due to intrinsic factor deficiency: Secondary | ICD-10-CM

## 2019-05-12 DIAGNOSIS — E538 Deficiency of other specified B group vitamins: Secondary | ICD-10-CM | POA: Diagnosis not present

## 2019-05-12 MED ORDER — CYANOCOBALAMIN 1000 MCG/ML IJ SOLN
1000.0000 ug | Freq: Once | INTRAMUSCULAR | Status: AC
  Administered 2019-05-12: 1000 ug via INTRAMUSCULAR

## 2019-05-12 NOTE — Progress Notes (Signed)
   Subjective:    Patient ID: Mia [name], female    DOB: 07/22/1951, 68 y.o.   MRN: [id number]  HPI Patient is here for a Vitamin B12 injection. Denied any gastrointestional problems or dizziness.    Review of Systems     Objective:   Physical Exam        Assessment & Plan:  Patient tolerated injection well without complication. Patient advised to schedule next injection in 30 days

## 2019-05-12 NOTE — Progress Notes (Signed)
Agree with documentation as above.   Zahlia Deshazer, MD  

## 2019-05-30 ENCOUNTER — Encounter: Primary: Internal Medicine

## 2019-06-01 ENCOUNTER — Other Ambulatory Visit: Admit: 2019-06-01 | Discharge: 2019-06-01 | Payer: MEDICARE | Primary: Internal Medicine

## 2019-06-01 ENCOUNTER — Inpatient Hospital Stay: Admit: 2019-06-01 | Payer: MEDICARE | Primary: Internal Medicine

## 2019-06-01 DIAGNOSIS — E119 Type 2 diabetes mellitus without complications: Secondary | ICD-10-CM

## 2019-06-01 LAB — LIPID PANEL
CHOL/HDL Ratio: 2.8 (ref 0–5.0)
Chol/HDL Ratio: 2.8 (ref 0–5.0)
Cholesterol, Total: 124 MG/DL (ref <200)
Cholesterol, total: 124 MG/DL (ref <200)
HDL Cholesterol: 44 MG/DL (ref 40–60)
HDL: 44 MG/DL (ref 40–60)
LDL Calculated: 50 MG/DL (ref 0–100)
LDL, calculated: 50 MG/DL (ref 0–100)
Triglyceride: 150 MG/DL — ABNORMAL HIGH (ref <150)
Triglycerides: 150 MG/DL — ABNORMAL HIGH (ref <150)
VLDL Cholesterol Calculated: 30 MG/DL
VLDL, calculated: 30 MG/DL

## 2019-06-01 LAB — METABOLIC PANEL, COMPREHENSIVE
A-G Ratio: 1 (ref 0.8–1.7)
ALT (SGPT): 27 U/L (ref 13–56)
AST (SGOT): 24 U/L (ref 10–38)
Albumin: 3.5 g/dL (ref 3.4–5.0)
Alk. phosphatase: 89 U/L (ref 45–117)
Anion gap: 4 mmol/L (ref 3.0–18)
BUN/Creatinine ratio: 12 (ref 12–20)
BUN: 9 MG/DL (ref 7.0–18)
Bilirubin, total: 0.4 MG/DL (ref 0.2–1.0)
CO2: 30 mmol/L (ref 21–32)
Calcium: 8.7 MG/DL (ref 8.5–10.1)
Chloride: 106 mmol/L (ref 100–111)
Creatinine: 0.73 MG/DL (ref 0.6–1.3)
GFR est AA: >60 mL/min/{1.73_m2} (ref >60)
GFR est non-AA: >60 mL/min/{1.73_m2} (ref >60)
Globulin: 3.5 g/dL (ref 2.0–4.0)
Glucose: 103 mg/dL — ABNORMAL HIGH (ref 74–99)
Potassium: 4 mmol/L (ref 3.5–5.5)
Protein, total: 7 g/dL (ref 6.4–8.2)
Sodium: 140 mmol/L (ref 136–145)

## 2019-06-01 LAB — CBC W/O DIFF
HCT: 40.8 % (ref 35.0–45.0)
HGB: 13 g/dL (ref 12.0–16.0)
MCH: 30.1 PG (ref 24.0–34.0)
MCHC: 31.9 g/dL (ref 31.0–37.0)
MCV: 94.4 FL (ref 74.0–97.0)
MPV: 10.1 FL (ref 9.2–11.8)
PLATELET: 190 10*3/uL (ref 135–420)
RBC: 4.32 M/uL (ref 4.20–5.30)
RDW: 13.6 % (ref 11.6–14.5)
WBC: 5 10*3/uL (ref 4.6–13.2)

## 2019-06-01 LAB — HEMOGLOBIN A1C W/O EAG
Hemoglobin A1C: 5.7 % — ABNORMAL HIGH (ref 4.2–5.6)
Hemoglobin A1c: 5.7 % — ABNORMAL HIGH (ref 4.2–5.6)

## 2019-06-01 LAB — COMPREHENSIVE METABOLIC PANEL
ALT: 27 U/L (ref 13–56)
AST: 24 U/L (ref 10–38)
Albumin/Globulin Ratio: 1 (ref 0.8–1.7)
Albumin: 3.5 g/dL (ref 3.4–5.0)
Alkaline Phosphatase: 89 U/L (ref 45–117)
Anion Gap: 4 mmol/L (ref 3.0–18)
BUN: 9 MG/DL (ref 7.0–18)
Bun/Cre Ratio: 12 (ref 12–20)
CO2: 30 mmol/L (ref 21–32)
Calcium: 8.7 MG/DL (ref 8.5–10.1)
Chloride: 106 mmol/L (ref 100–111)
Creatinine: 0.73 MG/DL (ref 0.6–1.3)
EGFR IF NonAfrican American: >60 mL/min/{1.73_m2} (ref >60)
GFR African American: >60 mL/min/{1.73_m2} (ref >60)
Globulin: 3.5 g/dL (ref 2.0–4.0)
Glucose: 103 mg/dL — ABNORMAL HIGH (ref 74–99)
Potassium: 4 mmol/L (ref 3.5–5.5)
Sodium: 140 mmol/L (ref 136–145)
Total Bilirubin: 0.4 MG/DL (ref 0.2–1.0)
Total Protein: 7 g/dL (ref 6.4–8.2)

## 2019-06-01 LAB — CBC
Hematocrit: 40.8 % (ref 35.0–45.0)
Hemoglobin: 13 g/dL (ref 12.0–16.0)
MCH: 30.1 PG (ref 24.0–34.0)
MCHC: 31.9 g/dL (ref 31.0–37.0)
MCV: 94.4 FL (ref 74.0–97.0)
MPV: 10.1 FL (ref 9.2–11.8)
Platelets: 190 10*3/uL (ref 135–420)
RBC: 4.32 M/uL (ref 4.20–5.30)
RDW: 13.6 % (ref 11.6–14.5)
WBC: 5 10*3/uL (ref 4.6–13.2)

## 2019-06-05 ENCOUNTER — Other Ambulatory Visit: Payer: Self-pay | Admitting: Family Medicine

## 2019-06-05 ENCOUNTER — Telehealth: Payer: Self-pay | Admitting: Cardiovascular Disease

## 2019-06-05 NOTE — Telephone Encounter (Signed)
New Message   Patients states that she has been having episodes where her entire chest feels like its shaking. She said yesterday her HR was 100+ and it was skipping.  She is wondering if this is part of her SVT. She said at the time that she had not taken her medication. Once she took her meds it felt more in contrail. Please call to discuss.Marland Kitchen

## 2019-06-05 NOTE — Telephone Encounter (Signed)
Agree with note by Christen Bame, RN.   Her tachycardia was certainly related to her missed doses of metoprolol.

## 2019-06-05 NOTE — Telephone Encounter (Signed)
Spoke with patient who states she was feeling poor yesterday morning with fast HR, feeling her chest shake, and HR skipping. States her daughter, who is a Passenger transport manager, came to help her. Patient missed 2 doses of metoprolol 150 mg on Saturday night and Sunday morning and I advised this likely contributed to her symptoms yesterday. She has felt better since resuming the metoprolol. Currently HR is 89 bpm and BP 125/65 mmHg. She states she experienced SOB during the episode of her heart racing but it has resolved. I advised her of the importance of taking the metoprolol every 12 hours and to continue to monitor symptoms. She is scheduled for a lab appointment on Wed. Aug. 12 and I advised that I will check her HR and BP per her request. I advised her to call back sooner with questions or concerns. She verbalized understanding and agreement and thanked me for the call.

## 2019-06-06 ENCOUNTER — Ambulatory Visit (INDEPENDENT_AMBULATORY_CARE_PROVIDER_SITE_OTHER): Payer: Medicare Other | Admitting: Emergency Medicine

## 2019-06-06 ENCOUNTER — Other Ambulatory Visit: Payer: Self-pay

## 2019-06-06 ENCOUNTER — Encounter: Payer: Self-pay | Admitting: Emergency Medicine

## 2019-06-06 ENCOUNTER — Ambulatory Visit: Admit: 2019-06-06 | Discharge: 2019-06-06 | Payer: MEDICARE | Attending: Internal Medicine | Primary: Internal Medicine

## 2019-06-06 ENCOUNTER — Ambulatory Visit: Attending: Internal Medicine | Primary: Internal Medicine

## 2019-06-06 DIAGNOSIS — E119 Type 2 diabetes mellitus without complications: Secondary | ICD-10-CM

## 2019-06-06 DIAGNOSIS — R0602 Shortness of breath: Secondary | ICD-10-CM | POA: Diagnosis not present

## 2019-06-06 DIAGNOSIS — G4733 Obstructive sleep apnea (adult) (pediatric): Secondary | ICD-10-CM

## 2019-06-06 NOTE — Assessment & Plan Note (Signed)
Discussed her compliance. She needs equipment, needed this visit to confirm follow up, get her equipment. We will forward notes to [facility] [place]. She is working on better compliance. Gets good clinical benefit.

## 2019-06-06 NOTE — Assessment & Plan Note (Signed)
She has had some exertional SOB, no evidence obstruction on her testing. I have asked her to to avoid albuterol because I do not want to exacerbate her tachycardia. She needs to work on her conditioning, slow steady exercise. Discussed this with her today

## 2019-06-06 NOTE — Progress Notes (Signed)
Virtual Visit via Telephone Note  I connected with Mia [name] on 06/06/19 at 10:15 AM EDT by telephone and verified that I am speaking with the correct person using two identifiers.  Location: Patient: Home Provider: Office   I discussed the limitations, risks, security and privacy concerns of performing an evaluation and management service by telephone and the availability of in person appointments. I also discussed with the patient that there may be a patient responsible charge related to this service. The patient expressed understanding and agreed to proceed.   History of Present Illness: Mia [name] is 82 with a history of paroxysmal SVT, and has been followed here for exertional dyspnea.  She is had reassuring pulmonary function testing methacholine challenge.  A cardiopulmonary exercise test showed exercise was limited by tachycardia.  She also has obstructive sleep apnea and is on CPAP.     Observations/Objective: She reports that she has very good compliance with her CPAP, wears it every night with exception of a time when she was travelling. She also had to take a break from it for a week when she had some skin irritation. Her machine is in good repair. She is waiting for equipment and needs this visit to obtain.  She tells me about an episode when she was hiking a few months ago when she had exertional SOB, heard some wheeze. She took her husband's albuterol - may have helped her breathing.   Compliance data last 30 days > 63% usage, 57% for > 4 hours.   Assessment and Plan: Dyspnea She has had some exertional SOB, no evidence obstruction on her testing. I have asked her to to avoid albuterol because I do not want to exacerbate her tachycardia. She needs to work on her conditioning, slow steady exercise. Discussed this with her today  OSA (obstructive sleep apnea) Discussed her compliance. She needs equipment, needed this visit to confirm follow up, get her equipment. We will  forward notes to [facility]. She is working on better compliance. Gets good clinical benefit.     Follow Up Instructions: 6 months.    I discussed the assessment and treatment plan with the patient. The patient was provided an opportunity to ask questions and all were answered. The patient agreed with the plan and demonstrated an understanding of the instructions.   The patient was advised to call back or seek an in-person evaluation if the symptoms worsen or if the condition fails to improve as anticipated.  I provided 23 minutes of non-face-to-face time during this encounter.   Mia Gobble, MD

## 2019-06-06 NOTE — Progress Notes (Signed)
68 y.o. white female who presents for f/u    Denied any cardiovascular complaints although she has been noticing more pedal edema intermittently.  No associated cp, sob, pnd, orthopnea.  They are laying low through the pandemic outside of going to the grocery store; no exercise    No gi or gu issues    Denies polyuria, polydipsia, nocturia, vision change.  Doing better with her diet and weight is stable    Vitals 06/06/2019 11/29/2018 05/26/2018 02/22/2018   Weight 163 lb 6.4 oz 164 lb 164 lb 166 lb     No sx referable to the thyroid    LAST MEDICARE WELLNESS EXAM: 11/10/16, 11/23/17, 11/29/18    Past Medical History:   Diagnosis Date   ??? Allergic rhinitis    ??? Anxiety state, unspecified    ??? Arthritis     Dr. Synetta Shadow, Dr. Marisa Hua   ??? Compression fx, thoracic spine [facility]) 2007    negative DEXA Dr. Marisa Hua   ??? Diabetes ([place]) 10/2017    on basis of fbs>125; intol metformin   ??? Dyslipidemia    ??? Dyspepsia    ??? 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 Frozen shoulder     right Dr. Wynonia Hazard MRI   ??? Left thyroid nodule 04/2016    1cm nodule; apparently noted on CT 2008 and unchanged   ??? Multiple lung nodules     no change 10/06, 03/07, 03/08   ??? Overweight (BMI 25.0-29.9)     IF 7/18 start weight 168 lbs not doing    ??? Painless hematuria 04/2016    Dr Jimmye Norman, neg eval   ??? Palpitations 2008    neg thallium 2008, nl holter 2008, echo nl lv/ef 65%/tr mr/dd/nl pasp   ??? Syncope     neurocardiogenic by tilt 1994   ??? Venous insufficiency    ??? Vitamin D deficiency      Past Surgical History:   Procedure Laterality Date   ??? CARDIAC SURG PROCEDURE UNLIST      thallium 2005 neg; NST 1/17 negative ef 75%   ??? ECHO 2D ADULT  12/04    normal with EF 70%   ??? HX COLONOSCOPY      Dr Rosendo Gros 2007 neg; 04/22/18 neg   ??? HX GYN      s/p BTL    ??? HX HEMORRHOIDECTOMY      Dr. Rosendo Gros 2007   ??? HX HYSTERECTOMY  03/11/2017     Dr Sheral Flow   ??? HX ORTHOPAEDIC      DEXA -0.5 spine, -0.8 hip (1/18)   ??? HX UROLOGICAL  06/2016    Dr Jimmye Norman; bladder bx showed benign lesions   ??? [place] ABD COMP  9/05    negative   ??? VAS CAROTID DUPLEX BILATERAL  2004    negative     Social History     Socioeconomic History   ??? Marital status: MARRIED     Spouse name: Not on file   ??? Number of children: 2   ??? Years of education: Not on file   ??? Highest education level: Not on file   Occupational History   ??? Occupation: benefits specialist   Social Needs   ??? Financial resource strain: Not on file   ??? Food insecurity     Worry: Not on file  Inability: Not on file   ??? Transportation needs     Medical: Not on file     Non-medical: Not on file   Tobacco Use   ??? Smoking status: Never Smoker   ??? Smokeless tobacco: Never Used   Substance and Sexual Activity   ??? Alcohol use: Yes     Alcohol/week: 4.0 standard drinks     Types: 4 Glasses of wine per week   ??? Drug use: No   ??? Sexual activity: Not on file   Lifestyle   ??? Physical activity     Days per week: Not on file     Minutes per session: Not on file   ??? Stress: Not on file   Relationships   ??? Social Product manager on phone: Not on file     Gets together: Not on file     Attends religious service: Not on file     Active member of club or organization: Not on file     Attends meetings of clubs or organizations: Not on file     Relationship status: Not on file   ??? Intimate partner violence     Fear of current or ex partner: Not on file     Emotionally abused: Not on file     Physically abused: Not on file     Forced sexual activity: Not on file   Other Topics Concern   ??? Not on file   Social History Narrative   ??? Not on file     Current Outpatient Medications   Medication Sig   ??? atorvastatin (LIPITOR) 40 mg tablet take 1 tablet by mouth once daily   ??? SITagliptin (JANUVIA) 100 mg tablet Take 1 Tab by mouth daily.   ??? citalopram (CELEXA) 10 mg tablet take 1 tablet by mouth once daily    ??? lansoprazole (PREVACID) 30 mg capsule take 1 capsule by mouth BEFORE BREAKFAST DAILY   ??? VITAMIN D2 50,000 unit capsule take 1 capsule by mouth every week (Patient taking differently: every thirty (30) days.)   ??? Blood-Glucose Meter monitoring kit Use daily as directed   ??? glucose blood VI test strips (BLOOD GLUCOSE TEST) strip Use daily as directed   ??? lancets misc Use daily as directed   ??? ergocalciferol (VITAMIN D2) 50,000 unit capsule Take 50,000 Units by mouth every thirty (30) days.   ??? omega-3 fatty acids (FISH OIL) cap Take 2 Tabs by mouth daily.   ??? aspirin 81 mg chewable tablet Take 81 mg by mouth daily.     No current facility-administered medications for this visit.      Allergies   Allergen Reactions   ??? Fish Oil Nausea Only   ??? Metformin Diarrhea   ??? Relafen [Nabumetone] Nausea Only     REVIEW OF SYSTEMS: gyn 4/18 Dr Andrew Au, mammo 3/19, colo 6/19 Dr Rosendo Gros, DEXA 1/18 Dr Margaretann Loveless ??? no vision change or eye pain  Oral ??? no mouth pain, tongue or tooth problems  Ears ??? no hearing loss, ear pain, fullness, no swallowing problems  Cardiac ??? no CP, PND, orthopnea, edema, palpitations or syncope  Chest ??? no breast masses  Resp ??? no wheezing, chronic coughing, dyspnea  Urinary ??? no dysuria, hematuria, flank pain, urgency, frequency    Visit Vitals  BP 106/70   Pulse 83   Temp 97.7 ??F (36.5 ??C) (Temporal)   Resp 14   Ht '5\' 4"'  (1.626 m)  Wt 163 lb 6.4 oz (74.1 kg)   SpO2 95%   BMI 28.05 kg/m??   Affect is appropriate.  Mood stable  No apparent distress  HEENT --Anicteric sclerae.  No JVD, or bruits.  Thyroid fullness on left  Lungs --Clear to auscultation and percussion, normal percussion.  Heart --Regular rate and rhythm, no murmurs, rubs, gallops, or clicks.  Abdomen -- Soft and nontender, no hepatosplenomegaly or masses.  Extremities -- Without cyanosis, clubbing, edema. 2+ pulses equally and bilaterally.  Varicose veins bilat, trace pedal edema symmetrically    LABS   From 5/10 showed   gluc 110, cr 0.70,               alt 23,                                    chol 154, tg 143, hdl 45, ldl-c 80,                                                           tsh 1.91  From 5/10 showed                     2 hr GTT 89  From 8/10 showed                                                              vit d 23.0, ck 57, aldo 5.4  From 8/11 showed   gluc 108,                                     hba1c 6.3,                   chol 160, tg 172, hdl 39, ldl-c 87,  wbc 5.,7 hb 12.3, plt 235, ua neg,     tsh 2.28  From 5/12 showed   gluc 113, cr 0.71, gfr 94,  alt 16, hba1c 6.2, ldl-p 1989, chol 177, tg 161, hdl 43, ldl-c 102  From 11/12 showed                                                     hba1c 5.9, ldl-p 1218, chol 133, tg 116, hdl 45, ldl-c 65  From 5/13 showed   gluc 105, cr 0.55, gfr 102, alt 8,  hba1c 6.2,                   chol 148, tg 107, hdl 45, ldl-c 82,  wbc 5.0, hb 12.5, plt 172, vit d 40.3  From 11/13 showed        hba1c 6.2, ldl-p 1888, chol 176, tg 155, hdl 49, ldl-c 86,  wbc 6.2, hb 12.3, plt 182, vit d 34.4  From 5/14 showed  hba1c 6.2,     chol 152, tg 157, hdl 39, ldl-c 82  From 1/15 showed   gluc 113, cr 0.68, gfr>60, alt 11, hba1c 6.2,     chol 146, tg 130, hdl 43, ldl-c 77  From 7/15 showed        hba1c 6.3,     chol 133, tg 124, hdl 39, ldl-c 69, wbc 6.3, hb 12.2, plt 193  From 6/16 showed   gluc 106, cr 0.74, gfr>60, alt 26, hba1c 6.3,     chol 126, tg 125, hdl 46, ldl-c 55, wbc 5.7, hb 13.2, plt 203, vit d 7.2,   tsh 1.69  From 12/16 showed gluc 110, cr 0.68, gfr>60, alt 30, hba1c 6.1,     chol 135, tg 147, hdl 47, ldl-c 59, wbc 6.0, hb 12.7, plt 197  From 6/17 showed   gluc 122, cr 0.71, gfr>60, alt 33, hba1c 6.2,     chol 153, tg 140, hdl 46, ldl-c 79, wbc 6.3, hb 13.1, plt 190,        tsh 1.92, hep c neg  From 1/18 showed        hba1c 6.2,    chol 154, tg 181, hdl 41, ldl-c 77   From 7/18 showed   gluc 129, cr 0.63, gfr>60, alt 26, hba1c 6.1,              wbc 6.0, hb 12.4, plt 193, vit d 79  From 1/19 showed   gluc 132, cr 0.83, gfr>60,    hba1c 6.1  From 4/19 showed        hba1c 7.0, umar 12.0, chol 124, tg 109, hdl 46, ldl-c 56,                 vit d 56.1  From 7/19 showed  gluc 106, cr 0.67, gfr>60, alt 25,  hba1c 6.0,               wbc 5.8, hb 13.0, plt 183  From 1/20 showed       hba1c 6.0, umar 17,    chol 130, tg 113, hdl 46, ldl-c 61    Results for orders placed or performed during the hospital encounter of 06/01/19   CBC W/O DIFF   Result Value Ref Range    WBC 5.0 4.6 - 13.2 K/uL    RBC 4.32 4.20 - 5.30 M/uL    HGB 13.0 12.0 - 16.0 g/dL    HCT 40.8 35.0 - 45.0 %    MCV 94.4 74.0 - 97.0 FL    MCH 30.1 24.0 - 34.0 PG    MCHC 31.9 31.0 - 37.0 g/dL    RDW 13.6 11.6 - 14.5 %    PLATELET 190 135 - 420 K/uL    MPV 10.1 9.2 - 11.8 FL   LIPID PANEL   Result Value Ref Range    LIPID PROFILE          Cholesterol, total 124 <200 MG/DL    Triglyceride 150 (H) <150 MG/DL    HDL Cholesterol 44 40 - 60 MG/DL    LDL, calculated 50 0 - 100 MG/DL    VLDL, calculated 30 MG/DL    CHOL/HDL Ratio 2.8 0 - 5.0     METABOLIC PANEL, COMPREHENSIVE   Result Value Ref Range    Sodium 140 136 - 145 mmol/L    Potassium 4.0 3.5 - 5.5 mmol/L    Chloride 106 100 - 111 mmol/L  CO2 30 21 - 32 mmol/L    Anion gap 4 3.0 - 18 mmol/L    Glucose 103 (H) 74 - 99 mg/dL    BUN 9 7.0 - 18 MG/DL    Creatinine 0.73 0.6 - 1.3 MG/DL    BUN/Creatinine ratio 12 12 - 20      GFR est AA >60 >60 ml/min/1.80m    GFR est non-AA >60 >60 ml/min/1.777m   Calcium 8.7 8.5 - 10.1 MG/DL    Bilirubin, total 0.4 0.2 - 1.0 MG/DL    ALT (SGPT) 27 13 - 56 U/L    AST (SGOT) 24 10 - 38 U/L    Alk. phosphatase 89 45 - 117 U/L    Protein, total 7.0 6.4 - 8.2 g/dL    Albumin 3.5 3.4 - 5.0 g/dL    Globulin 3.5 2.0 - 4.0 g/dL    A-G Ratio 1.0 0.8 - 1.7     HEMOGLOBIN A1C W/O EAG   Result Value Ref Range    Hemoglobin A1c 5.7 (H) 4.2 - 5.6 %      Patient Active Problem List   Diagnosis Code   ??? Vitamin D deficiency E55.9   ??? Anxiety F41.9   ??? Dyslipidemia E78.5   ??? 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 Overweight with body mass index (BMI) of 25 to 25.9 in adult E66.3, Z68.25   ??? Controlled type 2 diabetes mellitus, without long-term current use of insulin (HCC) E11.9     Assessment and plan:  1. DM.  Continue current regimen.  F/u ophth  2. Hyperlipidemia. Continue current.  vascepa once covered  3. Hypovitaminosis D. Continue current regimen.  4. Anxiety.  Continue celexa  5. Overweight.  Lifestyle and dietary measures.  Portion control reiterated.  6. Endometrial ca.  Per Dr SqSheral Flow7. Edema.  Declined diuretic for now; elev, cut salt, wt loss, etc        RTC 2/21    Above conditions discussed at length and patient vocalized understanding.  All questions answered to patient satisfaction

## 2019-06-06 NOTE — Progress Notes (Signed)
68 y.o. white female who presents for f/u    Denied any cardiovascular complaints although she has been noticing more pedal edema intermittently.  No associated cp, sob, pnd, orthopnea.  They are laying low through the pandemic outside of going to the grocery store; no exercise    No gi or gu issues    Denies polyuria, polydipsia, nocturia, vision change.  Doing better with her diet and weight is stable    Vitals 06/06/2019 11/29/2018 05/26/2018 02/22/2018   Weight 163 lb 6.4 oz 164 lb 164 lb 166 lb     No sx referable to the thyroid    LAST MEDICARE WELLNESS EXAM: 11/10/16, 11/23/17, 11/29/18    Past Medical History:   Diagnosis Date   ??? Allergic rhinitis    ??? Anxiety state, unspecified    ??? Arthritis     Dr. Synetta Shadow, Dr. Marisa Hua   ??? Compression fx, thoracic spine [facility]) 2007    negative DEXA Dr. Marisa Hua   ??? Diabetes ([place]) 10/2017    on basis of fbs>125; intol metformin   ??? Dyslipidemia    ??? Dyspepsia    ??? 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 Frozen shoulder     right Dr. Wynonia Hazard MRI   ??? Left thyroid nodule 04/2016    1cm nodule; apparently noted on CT 2008 and unchanged   ??? Multiple lung nodules     no change 10/06, 03/07, 03/08   ??? Overweight (BMI 25.0-29.9)     IF 7/18 start weight 168 lbs not doing    ??? Painless hematuria 04/2016    Dr Jimmye Norman, neg eval   ??? Palpitations 2008    neg thallium 2008, nl holter 2008, echo nl lv/ef 65%/tr mr/dd/nl pasp   ??? Syncope     neurocardiogenic by tilt 1994   ??? Venous insufficiency    ??? Vitamin D deficiency      Past Surgical History:   Procedure Laterality Date   ??? CARDIAC SURG PROCEDURE UNLIST      thallium 2005 neg; NST 1/17 negative ef 75%   ??? ECHO 2D ADULT  12/04    normal with EF 70%   ??? HX COLONOSCOPY      Dr Rosendo Gros 2007 neg; 04/22/18 neg   ??? HX GYN      s/p BTL    ??? HX HEMORRHOIDECTOMY      Dr. Rosendo Gros 2007   ??? HX HYSTERECTOMY  03/11/2017    Dr  Sheral Flow   ??? HX ORTHOPAEDIC      DEXA -0.5 spine, -0.8 hip (1/18)   ??? HX UROLOGICAL  06/2016    Dr Jimmye Norman; bladder bx showed benign lesions   ??? [place] ABD COMP  9/05    negative   ??? VAS CAROTID DUPLEX BILATERAL  2004    negative     Social History     Socioeconomic History   ??? Marital status: MARRIED     Spouse name: Not on file   ??? Number of children: 2   ??? Years of education: Not on file   ??? Highest education level: Not on file   Occupational History   ??? Occupation: benefits specialist   Social Needs   ??? Financial resource strain: Not on file   ??? Food insecurity     Worry: Not on file  Inability: Not on file   ??? Transportation needs     Medical: Not on file     Non-medical: Not on file   Tobacco Use   ??? Smoking status: Never Smoker   ??? Smokeless tobacco: Never Used   Substance and Sexual Activity   ??? Alcohol use: Yes     Alcohol/week: 4.0 standard drinks     Types: 4 Glasses of wine per week   ??? Drug use: No   ??? Sexual activity: Not on file   Lifestyle   ??? Physical activity     Days per week: Not on file     Minutes per session: Not on file   ??? Stress: Not on file   Relationships   ??? Social Product manager on phone: Not on file     Gets together: Not on file     Attends religious service: Not on file     Active member of club or organization: Not on file     Attends meetings of clubs or organizations: Not on file     Relationship status: Not on file   ??? Intimate partner violence     Fear of current or ex partner: Not on file     Emotionally abused: Not on file     Physically abused: Not on file     Forced sexual activity: Not on file   Other Topics Concern   ??? Not on file   Social History Narrative   ??? Not on file     Current Outpatient Medications   Medication Sig   ??? atorvastatin (LIPITOR) 40 mg tablet take 1 tablet by mouth once daily   ??? SITagliptin (JANUVIA) 100 mg tablet Take 1 Tab by mouth daily.   ??? citalopram (CELEXA) 10 mg tablet take 1 tablet by mouth once daily   ??? lansoprazole (PREVACID) 30 mg  capsule take 1 capsule by mouth BEFORE BREAKFAST DAILY   ??? VITAMIN D2 50,000 unit capsule take 1 capsule by mouth every week (Patient taking differently: every thirty (30) days.)   ??? Blood-Glucose Meter monitoring kit Use daily as directed   ??? glucose blood VI test strips (BLOOD GLUCOSE TEST) strip Use daily as directed   ??? lancets misc Use daily as directed   ??? ergocalciferol (VITAMIN D2) 50,000 unit capsule Take 50,000 Units by mouth every thirty (30) days.   ??? omega-3 fatty acids (FISH OIL) cap Take 2 Tabs by mouth daily.   ??? aspirin 81 mg chewable tablet Take 81 mg by mouth daily.     No current facility-administered medications for this visit.      Allergies   Allergen Reactions   ??? Fish Oil Nausea Only   ??? Metformin Diarrhea   ??? Relafen [Nabumetone] Nausea Only     REVIEW OF SYSTEMS: gyn 4/18 Dr Andrew Au, mammo 3/19, colo 6/19 Dr Rosendo Gros, DEXA 1/18 Dr Margaretann Loveless ??? no vision change or eye pain  Oral ??? no mouth pain, tongue or tooth problems  Ears ??? no hearing loss, ear pain, fullness, no swallowing problems  Cardiac ??? no CP, PND, orthopnea, edema, palpitations or syncope  Chest ??? no breast masses  Resp ??? no wheezing, chronic coughing, dyspnea  Urinary ??? no dysuria, hematuria, flank pain, urgency, frequency    Visit Vitals  BP 106/70   Pulse 83   Temp 97.7 ??F (36.5 ??C) (Temporal)   Resp 14   Ht '5\' 4"'  (1.626 m)  Wt 163 lb 6.4 oz (74.1 kg)   SpO2 95%   BMI 28.05 kg/m??   Affect is appropriate.  Mood stable  No apparent distress  HEENT --Anicteric sclerae.  No JVD, or bruits.  Thyroid fullness on left  Lungs --Clear to auscultation and percussion, normal percussion.  Heart --Regular rate and rhythm, no murmurs, rubs, gallops, or clicks.  Abdomen -- Soft and nontender, no hepatosplenomegaly or masses.  Extremities -- Without cyanosis, clubbing, edema. 2+ pulses equally and bilaterally.  Varicose veins bilat, trace pedal edema symmetrically    LABS  From 5/10 showed   gluc 110, cr 0.70,               alt 23,                                     chol 154, tg 143, hdl 45, ldl-c 80,                                                           tsh 1.91  From 5/10 showed                     2 hr GTT 89  From 8/10 showed                                                              vit d 23.0, ck 57, aldo 5.4  From 8/11 showed   gluc 108,                                     hba1c 6.3,                   chol 160, tg 172, hdl 39, ldl-c 87,  wbc 5.,7 hb 12.3, plt 235, ua neg,     tsh 2.28  From 5/12 showed   gluc 113, cr 0.71, gfr 94,  alt 16, hba1c 6.2, ldl-p 1989, chol 177, tg 161, hdl 43, ldl-c 102  From 11/12 showed                                                     hba1c 5.9, ldl-p 1218, chol 133, tg 116, hdl 45, ldl-c 65  From 5/13 showed   gluc 105, cr 0.55, gfr 102, alt 8,  hba1c 6.2,                   chol 148, tg 107, hdl 45, ldl-c 82,  wbc 5.0, hb 12.5, plt 172, vit d 40.3  From 11/13 showed        hba1c 6.2, ldl-p 1888, chol 176, tg 155, hdl 49, ldl-c 86,  wbc 6.2, hb 12.3, plt 182, vit d 34.4  From 5/14 showed  hba1c 6.2,     chol 152, tg 157, hdl 39, ldl-c 82  From 1/15 showed   gluc 113, cr 0.68, gfr>60, alt 11, hba1c 6.2,     chol 146, tg 130, hdl 43, ldl-c 77  From 7/15 showed        hba1c 6.3,     chol 133, tg 124, hdl 39, ldl-c 69, wbc 6.3, hb 12.2, plt 193  From 6/16 showed   gluc 106, cr 0.74, gfr>60, alt 26, hba1c 6.3,     chol 126, tg 125, hdl 46, ldl-c 55, wbc 5.7, hb 13.2, plt 203, vit d 7.2,   tsh 1.69  From 12/16 showed gluc 110, cr 0.68, gfr>60, alt 30, hba1c 6.1,     chol 135, tg 147, hdl 47, ldl-c 59, wbc 6.0, hb 12.7, plt 197  From 6/17 showed   gluc 122, cr 0.71, gfr>60, alt 33, hba1c 6.2,     chol 153, tg 140, hdl 46, ldl-c 79, wbc 6.3, hb 13.1, plt 190,        tsh 1.92, hep c neg  From 1/18 showed        hba1c 6.2,    chol 154, tg 181, hdl 41, ldl-c 77  From 7/18 showed   gluc 129, cr 0.63, gfr>60, alt 26, hba1c 6.1,              wbc 6.0, hb 12.4, plt 193, vit d 79  From 1/19 showed   gluc 132, cr  0.83, gfr>60,    hba1c 6.1  From 4/19 showed        hba1c 7.0, umar 12.0, chol 124, tg 109, hdl 46, ldl-c 56,                 vit d 56.1  From 7/19 showed  gluc 106, cr 0.67, gfr>60, alt 25,  hba1c 6.0,               wbc 5.8, hb 13.0, plt 183  From 1/20 showed       hba1c 6.0, umar 17,    chol 130, tg 113, hdl 46, ldl-c 61    Results for orders placed or performed during the hospital encounter of 06/01/19   CBC W/O DIFF   Result Value Ref Range    WBC 5.0 4.6 - 13.2 K/uL    RBC 4.32 4.20 - 5.30 M/uL    HGB 13.0 12.0 - 16.0 g/dL    HCT 40.8 35.0 - 45.0 %    MCV 94.4 74.0 - 97.0 FL    MCH 30.1 24.0 - 34.0 PG    MCHC 31.9 31.0 - 37.0 g/dL    RDW 13.6 11.6 - 14.5 %    PLATELET 190 135 - 420 K/uL    MPV 10.1 9.2 - 11.8 FL   LIPID PANEL   Result Value Ref Range    LIPID PROFILE          Cholesterol, total 124 <200 MG/DL    Triglyceride 150 (H) <150 MG/DL    HDL Cholesterol 44 40 - 60 MG/DL    LDL, calculated 50 0 - 100 MG/DL    VLDL, calculated 30 MG/DL    CHOL/HDL Ratio 2.8 0 - 5.0     METABOLIC PANEL, COMPREHENSIVE   Result Value Ref Range    Sodium 140 136 - 145 mmol/L    Potassium 4.0 3.5 - 5.5 mmol/L    Chloride 106 100 - 111 mmol/L  CO2 30 21 - 32 mmol/L    Anion gap 4 3.0 - 18 mmol/L    Glucose 103 (H) 74 - 99 mg/dL    BUN 9 7.0 - 18 MG/DL    Creatinine 0.73 0.6 - 1.3 MG/DL    BUN/Creatinine ratio 12 12 - 20      GFR est AA >60 >60 ml/min/1.16m    GFR est non-AA >60 >60 ml/min/1.751m   Calcium 8.7 8.5 - 10.1 MG/DL    Bilirubin, total 0.4 0.2 - 1.0 MG/DL    ALT (SGPT) 27 13 - 56 U/L    AST (SGOT) 24 10 - 38 U/L    Alk. phosphatase 89 45 - 117 U/L    Protein, total 7.0 6.4 - 8.2 g/dL    Albumin 3.5 3.4 - 5.0 g/dL    Globulin 3.5 2.0 - 4.0 g/dL    A-G Ratio 1.0 0.8 - 1.7     HEMOGLOBIN A1C W/O EAG   Result Value Ref Range    Hemoglobin A1c 5.7 (H) 4.2 - 5.6 %     Patient Active Problem List   Diagnosis Code   ??? Vitamin D deficiency E55.9   ??? Anxiety F41.9   ??? Dyslipidemia E78.5   ??? Arthritis, degenerative M19.90    ??? Overweight with body mass index (BMI) of 25 to 25.9 in adult E66.3, Z68.25   ??? Controlled type 2 diabetes mellitus, without long-term current use of insulin (HCC) E11.9     Assessment and plan:  1. DM.  Continue current regimen.  F/u ophth  2. Hyperlipidemia. Continue current.  vascepa once covered  3. Hypovitaminosis D. Continue current regimen.  4. Anxiety.  Continue celexa  5. Overweight.  Lifestyle and dietary measures.  Portion control reiterated.  6. Endometrial ca.  Per Dr SqSheral Flow7. Edema.  Declined diuretic for now; elev, cut salt, wt loss, etc        RTC 2/21    Above conditions discussed at length and patient vocalized understanding.  All questions answered to patient satisfaction

## 2019-06-07 ENCOUNTER — Other Ambulatory Visit: Payer: Self-pay

## 2019-06-07 ENCOUNTER — Other Ambulatory Visit: Payer: Medicare Other | Admitting: *Deleted

## 2019-06-07 DIAGNOSIS — I471 Supraventricular tachycardia, unspecified: Secondary | ICD-10-CM

## 2019-06-07 DIAGNOSIS — M069 Rheumatoid arthritis, unspecified: Secondary | ICD-10-CM | POA: Diagnosis not present

## 2019-06-07 DIAGNOSIS — I10 Essential (primary) hypertension: Secondary | ICD-10-CM | POA: Diagnosis not present

## 2019-06-07 DIAGNOSIS — Z90710 Acquired absence of both cervix and uterus: Secondary | ICD-10-CM | POA: Diagnosis not present

## 2019-06-07 DIAGNOSIS — Z96651 Presence of right artificial knee joint: Secondary | ICD-10-CM | POA: Diagnosis not present

## 2019-06-07 DIAGNOSIS — Z8262 Family history of osteoporosis: Secondary | ICD-10-CM | POA: Diagnosis not present

## 2019-06-07 DIAGNOSIS — M810 Age-related osteoporosis without current pathological fracture: Secondary | ICD-10-CM | POA: Diagnosis not present

## 2019-06-07 DIAGNOSIS — Z1231 Encounter for screening mammogram for malignant neoplasm of breast: Secondary | ICD-10-CM | POA: Diagnosis not present

## 2019-06-07 DIAGNOSIS — E782 Mixed hyperlipidemia: Secondary | ICD-10-CM | POA: Diagnosis not present

## 2019-06-07 LAB — HM DEXA SCAN

## 2019-06-07 LAB — BASIC METABOLIC PANEL
BUN/Creatinine Ratio: 12 (ref 12–28)
BUN: 9 mg/dL (ref 8–27)
CO2: 25 mmol/L (ref 20–29)
Calcium: 9.3 mg/dL (ref 8.7–10.3)
Chloride: 102 mmol/L (ref 96–106)
Creatinine, Ser: 0.78 mg/dL (ref 0.57–1.00)
GFR calc Af Amer: 91 mL/min/{1.73_m2} (ref >59)
GFR calc non Af Amer: 79 mL/min/{1.73_m2} (ref >59)
Glucose: 107 mg/dL — ABNORMAL HIGH (ref 65–99)
Potassium: 4.4 mmol/L (ref 3.5–5.2)
Sodium: 140 mmol/L (ref 134–144)

## 2019-06-07 LAB — LIPID PANEL
Chol/HDL Ratio: 2.5 ratio (ref 0.0–4.4)
Cholesterol, Total: 128 mg/dL (ref 100–199)
HDL: 52 mg/dL (ref >39)
LDL Calculated: 51 mg/dL (ref 0–99)
Triglycerides: 123 mg/dL (ref 0–149)
VLDL Cholesterol Cal: 25 mg/dL (ref 5–40)

## 2019-06-07 LAB — HEPATIC FUNCTION PANEL
ALT: 19 IU/L (ref 0–32)
AST: 21 IU/L (ref 0–40)
Albumin: 3.9 g/dL (ref 3.8–4.8)
Alkaline Phosphatase: 59 IU/L (ref 39–117)
Bilirubin Total: 0.3 mg/dL (ref 0.0–1.2)
Bilirubin, Direct: 0.12 mg/dL (ref 0.00–0.40)
Total Protein: 5.9 g/dL — ABNORMAL LOW (ref 6.0–8.5)

## 2019-06-07 LAB — HM MAMMOGRAPHY

## 2019-06-07 MED ORDER — ATORVASTATIN 40 MG TAB
40 mg | ORAL_TABLET | ORAL | 3 refills | Status: DC
Start: 2019-06-07 — End: 2020-08-28

## 2019-06-07 NOTE — Telephone Encounter (Signed)
Patient came in for lab work and states she feels much better since Monday BP today is 128/66 mmHg in left arm in sitting position with regular cuff Pulse is 68 bpm Patient thanked me for checking these things for her and is aware of the importance of taking metoprolol regularly as scheduled. She thanked me for my assistance.

## 2019-06-08 ENCOUNTER — Other Ambulatory Visit: Payer: Medicare Other

## 2019-06-09 ENCOUNTER — Telehealth: Payer: Self-pay | Admitting: Family Medicine

## 2019-06-09 NOTE — Telephone Encounter (Signed)
Call patient: I did receive her bone density test from [facility] mammography.  Date of test was 06/07/2019.  T score was -2.6.  Great news she has had a significant increase in bone density in the spine and hip.  Lets continue with the Prolia as it seems to be working well.  Plan to recheck bone density in 2 to 3 years.

## 2019-06-12 ENCOUNTER — Other Ambulatory Visit: Payer: Self-pay

## 2019-06-12 ENCOUNTER — Ambulatory Visit (INDEPENDENT_AMBULATORY_CARE_PROVIDER_SITE_OTHER): Payer: Medicare Other | Admitting: Family Medicine

## 2019-06-12 VITALS — BP 119/70 | HR 60 | Temp 97.7°F | Ht 66.0 in | Wt 181.0 lb

## 2019-06-12 DIAGNOSIS — D510 Vitamin B12 deficiency anemia due to intrinsic factor deficiency: Secondary | ICD-10-CM | POA: Diagnosis not present

## 2019-06-12 DIAGNOSIS — E538 Deficiency of other specified B group vitamins: Secondary | ICD-10-CM

## 2019-06-12 MED ORDER — CYANOCOBALAMIN 1000 MCG/ML IJ SOLN
1000.0000 ug | Freq: Once | INTRAMUSCULAR | Status: AC
  Administered 2019-06-12: 1000 ug via INTRAMUSCULAR

## 2019-06-12 NOTE — Progress Notes (Signed)
Agree with documentation as above.   Mari Battaglia, MD  

## 2019-06-12 NOTE — Progress Notes (Signed)
Patient presents to clinic for B12 injection. Patient prefers this in her hip. It was given in the [place] with no immediate complications. Patient denies any side effects of B12. Patient was instructed to make a follow up in 30 days.   Patient wanted to know the results of her Bone Density and it was given to patient at her request.

## 2019-06-12 NOTE — Telephone Encounter (Signed)
Patient was given the results of her Bone Density while in the office for a B12 injection. She did not have any questions.

## 2019-06-14 ENCOUNTER — Other Ambulatory Visit: Payer: Self-pay | Admitting: Adult Health

## 2019-06-19 NOTE — Progress Notes (Signed)
PATIENT: Mia [name] DOB: [birth year]  REASON FOR VISIT: follow up HISTORY FROM: patient  HISTORY OF PRESENT ILLNESS: Today 06/20/19  [name] is a 68 year old female with history of mild memory disturbance and essential tremor.  She remains on Aricept and Namenda.  Her last memory score was 23/30.  She is following with [name] neurosurgery for back pain and has been diagnosed with left piriformis syndrome.  MRI of her pelvis shows evidence of a stress fracture at the left femoral head, likely related to a diagnosis of osteoporosis.  She received a left piriformis injection that was beneficial.  On exam, she has continued to show diffuse bilateral lower extremity weakness, not explained by left piriformis syndrome.  She had EMG/NCS 12/16/2018 that was normal.  After last visit at this office, she was sent for cognitive training through speech therapy.  She remains on Mysoline for her essential tremor. She continues to report trouble with her balance. She had a fall yesterday. She frequently stumbles. Since last visit, she may have had 4 falls. She has had issues with her balance for several years. She is retired, during the day she does her housework, and does projects with her husband. She does all her own ADLs. She is able to cook. She is putting special attention into remembering things, makes notes. She reports wording finding difficulty. [name] is also filling Effexor-XR for her fibromyalgia. She was on Lyrica in the past and had swelling.  She presents for follow-up accompanied by her husband.  HISTORY  12/20/2018 [name]: [name] is a 68 year old right-handed white female with a history of a mild memory disturbance, she believes that her memory is gradually worsening over time, she is on Aricept and Namenda, she tolerates these medications well.  The patient mainly is concerned today about her low back pain which is being evaluated by [name] from neurosurgery.  She went  to the emergency room on 30 June 2018 with back pain, she has had a recent MRI of the lumbar spine, the results of this not available to me.  She has also recently had EMG and nerve conduction study evaluation, she is to go over the results with her doctor in the next day or 2.  She has pain in the left lower back that radiates down to the left knee, the pain is there with lying down, sitting, and standing.  The patient has chronic issues with weakness of the arms and legs.  She has a history of low B12 levels, she gets B12 shots once a month.  She is on Mysoline for the tremor.  She returns to this office for an evaluation  REVIEW OF SYSTEMS: Out of a complete 14 system review of symptoms, the patient complains only of the following symptoms, and all other reviewed systems are negative.  Gait instability, falls, weakness, memory loss  ALLERGIES: Allergies  Allergen Reactions  . Codeine Nausea And Vomiting  . Myrbetriq  [Mirabegron] Other (See Comments)    SEVERE HEADACHE  . Percocet [Oxycodone-Acetaminophen] Nausea And Vomiting  . Celebrex [Celecoxib] Other (See Comments)    Other reaction(s): Other flushed  . Darvocet [Propoxyphene N-Acetaminophen] Nausea And Vomiting  . Erythromycin Nausea And Vomiting    Nausea and vomiting   . Hydrocodone Nausea And Vomiting  . Lyrica [Pregabalin] Swelling    Legs swelling   . Macrodantin [Nitrofurantoin] Nausea And Vomiting  . Toradol [Ketorolac Tromethamine] Nausea And Vomiting    Per patient, only  PO form causes nausea and vomiting. Can take injection without issue  . Tramadol Nausea And Vomiting  . Verapamil Nausea And Vomiting    REACTION: intolerance    HOME MEDICATIONS: Outpatient Medications Prior to Visit  Medication Sig Dispense Refill  . AMBULATORY NON FORMULARY MEDICATION Medication Name: One touch ultra strips. Check fasting blood sugar in the morning and as needed.  Dx - [id number] Fax to [phone] 50 strip 11  .  atorvastatin (LIPITOR) 40 MG tablet TAKE 1 TABLET EVERY DAY 90 tablet 2  . Cyanocobalamin (VITAMIN B-12 IJ) Inject as directed every 30 (thirty) days.    Marland Kitchen denosumab (PROLIA) 60 MG/ML SOLN injection Inject 60 mg into the skin every 6 (six) months. Administer in upper arm, thigh, or abdomen    . donepezil (ARICEPT) 10 MG tablet TAKE 1 TABLET BY MOUTH AT BEDTIME 90 tablet 3  . fesoterodine (TOVIAZ) 8 MG TB24 tablet Take 8 mg by mouth daily.    . fluticasone (FLONASE) 50 MCG/ACT nasal spray Place 2 sprays into both nostrils daily. (Patient taking differently: Place 2 sprays into both nostrils as needed. ) 16 g 6  . gabapentin (NEURONTIN) 300 MG capsule TAKE 1 TO 2 CAPSULES BY MOUTH UP TO TWO TIMES DAILY AS NEEDED 180 capsule 3  . ibuprofen (ADVIL,MOTRIN) 800 MG tablet Take 1 tablet (800 mg total) by mouth daily with supper. (Patient taking differently: Take 800 mg by mouth as needed. ) 30 tablet 3  . leflunomide (ARAVA) 20 MG tablet Take 1 tablet (20 mg total) by mouth daily. 90 tablet 0  . Loratadine-Pseudoephedrine (CLARITIN-D 12 HOUR PO) Take by mouth as needed.    . Melatonin 5 MG TABS Take 5 mg by mouth at bedtime.     . memantine (NAMENDA) 10 MG tablet TAKE 1 TABLET TWICE DAILY 180 tablet 4  . metFORMIN (GLUCOPHAGE-XR) 500 MG 24 hr tablet TAKE 2 TABLETS EVERY DAY WITH BREAKFAST 180 tablet 1  . metoprolol tartrate (LOPRESSOR) 100 MG tablet TAKE 1 AND 1/2 TABLETS TWICE DAILY 270 tablet 3  . NON FORMULARY Place into the nose at bedtime. cpap machine    . ondansetron (ZOFRAN) 4 MG tablet TAKE 2 TABLETS BY MOUTH 2 TIMES A DAY AS NEEDED FOR NAUSEA FOR UP TO 4 DOSES. 20 tablet 0  . ONE TOUCH ULTRA TEST test strip USE TO TEST FASTING BLOOD SUGAR IN THE MORNING AND AS NEEDED 50 each 11  . pantoprazole (PROTONIX) 40 MG tablet Take 40 mg by mouth 2 (two) times daily.    . polyethylene glycol powder (MIRALAX) powder Take 17 g by mouth daily.    . primidone (MYSOLINE) 50 MG tablet TAKE 1 TABLET AT BEDTIME  90 tablet 1  . Probiotic Product (PROBIOTIC ADVANCED PO) Take 1 capsule by mouth daily.    Marland Kitchen venlafaxine XR (EFFEXOR-XR) 75 MG 24 hr capsule TAKE 1 CAPSULE EVERY DAY WITH BREAKFAST 90 capsule 3  . losartan (COZAAR) 50 MG tablet Take 1 tablet (50 mg total) by mouth daily. 90 tablet 3   No facility-administered medications prior to visit.     PAST MEDICAL HISTORY: Past Medical History:  Diagnosis Date  . Anal fissure   . Anemia   . Arthritis   . Blood transfusion   . C. difficile colitis   . Chest pain    a. 01/2013 MV: EF 59%, no ischemia.  . Chronic Dyspnea    a. 01/2013 Echo: EF 60-65%, Gr 1 DD, PASP 38mmHg.  . Diabetes  mellitus   . Fibromyalgia   . Gall stones   . GERD (gastroesophageal reflux disease)    gastritis  . Hemorrhoids   . Hypertension   . Interstitial cystitis   . MCI (mild cognitive impairment) 11/25/2017  . Memory difficulty 12/14/2013  . Neuromuscular disorder ([place])    sclerosis  . Osteoporosis   . Peptic ulcer   . PONV (postoperative nausea and vomiting)   . Pseudogout   . Raynaud phenomenon   . Rectal bleeding   . Sleep apnea    a. on cpap.  Marland Kitchen SVT (supraventricular tachycardia) ([place])   . Syncope 09/10/2015  . Thyroid disease    hypothyroidism  . Tremor, essential 05/14/2016    PAST SURGICAL HISTORY: Past Surgical History:  Procedure Laterality Date  . APPENDECTOMY    . BLADDER SURGERY     x2  . BLADDER SURGERY    . BREAST BIOPSY    . CARDIAC CATHETERIZATION    . CARPAL TUNNEL RELEASE    . CATARACT EXTRACTION Bilateral   . CHOLECYSTECTOMY    . DILATION AND CURETTAGE OF UTERUS    . ENTEROCELE REPAIR     x2  . EYE SURGERY     retina  . hysterectomy - unknown type    . JOINT REPLACEMENT    . KNEE ARTHROPLASTY    . KNEE SURGERY     x6  . NECK SURGERY     fusion  . OOPHORECTOMY    . QUADRICEPS REPAIR Right   . RECTOCELE REPAIR     x2  . SHOULDER ARTHROSCOPY WITH SUBACROMIAL DECOMPRESSION, ROTATOR CUFF REPAIR AND BICEP TENDON REPAIR   10/06/2012   Procedure: SHOULDER ARTHROSCOPY WITH SUBACROMIAL DECOMPRESSION, ROTATOR CUFF REPAIR AND BICEP TENDON REPAIR;  Surgeon: Nita Sells, MD;  Location: [facility];  Service: Orthopedics;  Laterality: Right;  Arthroscopic  Repair  of  Subscapularis, Open Biceps Tenodesis  . SHOULDER SURGERY     bilateral- bones spur  . TONSILLECTOMY    . TOTAL KNEE ARTHROPLASTY    . TOTAL SHOULDER ARTHROPLASTY      FAMILY HISTORY: Family History  Problem Relation Age of Onset  . Heart attack Mother   . Stroke Mother   . Diabetes Mother   . Heart disease Mother   . Colon polyps Mother   . Asthma Mother   . Breast cancer Maternal Grandmother   . Wilson's disease Maternal Grandmother   . Pancreatic cancer Maternal Grandfather   . Heart disease Father   . Heart attack Father   . Asthma Father   . Stroke Sister   . Hypertension Sister   . Rheum arthritis Sister   . Dementia Sister   . Asthma Sister   . Lupus Sister   . Asthma Sister     SOCIAL HISTORY: Social History   Socioeconomic History  . Marital status: Married    Spouse name: Not on file  . Number of children: 2  . Years of education: 45  . Highest education level: Not on file  Occupational History  . Occupation: Retired  Scientific laboratory technician  . Financial resource strain: Not on file  . Food insecurity    Worry: Not on file    Inability: Not on file  . Transportation needs    Medical: Not on file    Non-medical: Not on file  Tobacco Use  . Smoking status: Never Smoker  . Smokeless tobacco: Never Used  Substance and Sexual Activity  . Alcohol  use: No    Alcohol/week: 0.0 standard drinks  . Drug use: No  . Sexual activity: Not on file  Lifestyle  . Physical activity    Days per week: Not on file    Minutes per session: Not on file  . Stress: Not on file  Relationships  . Social Herbalist on phone: Not on file    Gets together: Not on file    Attends religious service: Not on  file    Active member of club or organization: Not on file    Attends meetings of clubs or organizations: Not on file    Relationship status: Not on file  . Intimate partner violence    Fear of current or ex partner: Not on file    Emotionally abused: Not on file    Physically abused: Not on file    Forced sexual activity: Not on file  Other Topics Concern  . Not on file  Social History Narrative   Patient drinks about 3-4 cups of caffeine daily.   Patient is right handed.    PHYSICAL EXAM  Vitals:   06/20/19 1106  BP: 108/65  Pulse: 62  Temp: (!) 97.3 F (36.3 C)  Weight: 181 lb 9.6 oz (82.4 kg)  Height: 5\' 6"  (1.676 m)   Body mass index is 29.31 kg/m.  Generalized: Well developed, in no acute distress  MMSE - Mini Mental State Exam 06/20/2019 12/20/2018 06/02/2018  Orientation to time 4 4 4   Orientation to Place 5 5 5   Registration 3 3 3   Attention/ Calculation 2 2 2   Recall 3 0 1  Language- name 2 objects 2 2 2   Language- repeat 1 1 1   Language- follow 3 step command 3 2 3   Language- read & follow direction 1 1 1   Write a sentence 1 1 1   Copy design 1 1 1   Copy design-comments named 9 animals - -  Total score 26 22 24     Neurological examination  Mentation: Alert oriented to time, place, history taking. Follows all commands speech and language fluent Cranial nerve II-XII: Pupils were equal round reactive to light. Extraocular movements were full, visual field were full on confrontational test. Facial sensation and strength were normal.  Head turning and shoulder shrug were normal and symmetric. Motor: The motor testing reveals 5 over 5 strength of bilateral upper extremities, 3/5 lower extremities, good symmetric motor tone is noted throughout.  Sensory: Sensory testing is intact to soft touch on all 4 extremities. No evidence of extinction is noted.  Coordination: Cerebellar testing reveals good finger-nose-finger and heel-to-shin bilaterally.  Gait and station:  She has a limping type gait on the left, has a tendency to cross the left foot over to the right side when stepping, Romberg is unsteady, unable to stand with feet side-by-side Reflexes: Deep tendon reflexes are symmetric  DIAGNOSTIC DATA (LABS, IMAGING, TESTING) - I reviewed patient records, labs, notes, testing and imaging myself where available.  Lab Results  Component Value Date   WBC 6.6 03/14/2019   HGB 12.1 03/14/2019   HCT 37.6 03/14/2019   MCV 91.0 03/14/2019   PLT 334 03/14/2019      Component Value Date/Time   NA 140 06/07/2019 0934   K 4.4 06/07/2019 0934   CL 102 06/07/2019 0934   CO2 25 06/07/2019 0934   GLUCOSE 107 (H) 06/07/2019 0934   GLUCOSE 123 (H) 03/14/2019 1421   BUN 9 06/07/2019 0934   CREATININE  0.78 06/07/2019 0934   CREATININE 0.88 03/14/2019 1421   CALCIUM 9.3 06/07/2019 0934   PROT 5.9 (L) 06/07/2019 0934   ALBUMIN 3.9 06/07/2019 0934   AST 21 06/07/2019 0934   ALT 19 06/07/2019 0934   ALKPHOS 59 06/07/2019 0934   BILITOT 0.3 06/07/2019 0934   GFRNONAA 79 06/07/2019 0934   GFRNONAA 68 03/14/2019 1421   GFRAA 91 06/07/2019 0934   GFRAA 79 03/14/2019 1421   Lab Results  Component Value Date   CHOL 128 06/07/2019   HDL 52 06/07/2019   LDLCALC 51 06/07/2019   TRIG 123 06/07/2019   CHOLHDL 2.5 06/07/2019   Lab Results  Component Value Date   HGBA1C 5.9 (A) 03/13/2019   Lab Results  Component Value Date   [id number] 03/13/2019   Lab Results  Component Value Date   TSH 1.33 11/14/2018   EMG/NCS 12/16/2018  Impression: This is a normal study. At this time there is no electrodiagnostic evidence of a widespread peripheral neuropathy, myopathy, or right/left lumbosacral radiculopathy. Possible there could be irritation of the sciatic nerve leading to diffuse muscle weakness and buttock pain that would be consistent with a piriformis syndrome, however no active denervation of the muscles that are innervated by the sciatic nerve.    ASSESSMENT AND PLAN 68 y.o. year old female  has a past medical history of Anal fissure, Anemia, Arthritis, Blood transfusion, C. difficile colitis, Chest pain, Chronic Dyspnea, Diabetes mellitus, Fibromyalgia, Gall stones, GERD (gastroesophageal reflux disease), Hemorrhoids, Hypertension, Interstitial cystitis, MCI (mild cognitive impairment) (11/25/2017), Memory difficulty (12/14/2013), Neuromuscular disorder ([place]), Osteoporosis, Peptic ulcer, PONV (postoperative nausea and vomiting), Pseudogout, Raynaud phenomenon, Rectal bleeding, Sleep apnea, SVT (supraventricular tachycardia) ([place]), Syncope (09/10/2015), Thyroid disease, and Tremor, essential (05/14/2016). here with:  1.  History of memory disturbance -She will continue taking Aricept and Namenda, does not need refills at this time -MMSE has improved since last visit was 26/30 today -She will continue making notes for herself   2.  Essential tremor -reported head titubation, tremor right hand, not noted on exam -Well-controlled with primidone prescribed by her primary doctor  3.  Gait instability -She has reported chronic issues of gait instability, weakness in her lower extremities, to my knowledge we have been unable to identify any definitive etiology for her symptoms -She gets monthly B12 injections -She has had 4 falls since last visit, most recent was yesterday -She follows with neurosurgery, for low back pain, diagnosed with left piriformis syndrome, but doesn't explain her balance and BLE weakness issues -She had an EMG/NCS in 01/13/2019 that was normal at [facility], no evidence of peripheral neuropathy, myopathy or lumbosacral radiculopathy. -She will use her walker at home if she is outside walking for extended distances -I offered physical therapy for gait training, she does not think it would be very helpful at this time  4.  Fibromyalgia -She will continue Effexor XR 75 mg, does not need refills -Under good control   She will  follow-up in 6 months or sooner if needed.   I spent 25 minutes with the patient. 50% of this time was spent discussing her plan of care.   Butler Denmark, AGNP-C, DNP 06/20/2019, 11:11 AM [facility] [address] [place], [place] [address] [phone]

## 2019-06-20 ENCOUNTER — Ambulatory Visit (INDEPENDENT_AMBULATORY_CARE_PROVIDER_SITE_OTHER): Payer: Medicare Other | Admitting: Neurology

## 2019-06-20 ENCOUNTER — Encounter: Payer: Self-pay | Admitting: Neurology

## 2019-06-20 ENCOUNTER — Other Ambulatory Visit: Payer: Self-pay

## 2019-06-20 VITALS — BP 108/65 | HR 62 | Temp 97.3°F | Ht 66.0 in | Wt 181.6 lb

## 2019-06-20 DIAGNOSIS — M797 Fibromyalgia: Secondary | ICD-10-CM | POA: Diagnosis not present

## 2019-06-20 DIAGNOSIS — R413 Other amnesia: Secondary | ICD-10-CM | POA: Diagnosis not present

## 2019-06-20 DIAGNOSIS — G250 Essential tremor: Secondary | ICD-10-CM

## 2019-06-20 DIAGNOSIS — R2681 Unsteadiness on feet: Secondary | ICD-10-CM | POA: Diagnosis not present

## 2019-06-20 NOTE — Progress Notes (Signed)
I have read the note, and I agree with the clinical assessment and plan.  Kanon Novosel K Neira Bentsen   

## 2019-06-20 NOTE — Patient Instructions (Signed)
1. Continue Aricept and Namenda 2. Continue Effexor for fibromyalgia  3. We may consider physical therapy for gait training 4. Follow-up in 6 months

## 2019-06-22 DIAGNOSIS — K219 Gastro-esophageal reflux disease without esophagitis: Secondary | ICD-10-CM | POA: Diagnosis not present

## 2019-06-22 DIAGNOSIS — K594 Anal spasm: Secondary | ICD-10-CM | POA: Diagnosis not present

## 2019-06-22 DIAGNOSIS — K582 Mixed irritable bowel syndrome: Secondary | ICD-10-CM | POA: Diagnosis not present

## 2019-07-12 ENCOUNTER — Telehealth

## 2019-07-12 NOTE — Telephone Encounter (Signed)
Patient needs to schedule another ultrasound of her right breast due in October. Please place the orders so she can get this scheduled.

## 2019-07-13 ENCOUNTER — Other Ambulatory Visit: Payer: Self-pay

## 2019-07-13 ENCOUNTER — Ambulatory Visit (INDEPENDENT_AMBULATORY_CARE_PROVIDER_SITE_OTHER): Payer: Medicare Other | Admitting: Family Medicine

## 2019-07-13 VITALS — BP 116/62 | HR 75 | Temp 98.0°F | Wt 177.0 lb

## 2019-07-13 DIAGNOSIS — E538 Deficiency of other specified B group vitamins: Secondary | ICD-10-CM | POA: Diagnosis not present

## 2019-07-13 DIAGNOSIS — D510 Vitamin B12 deficiency anemia due to intrinsic factor deficiency: Secondary | ICD-10-CM | POA: Diagnosis not present

## 2019-07-13 DIAGNOSIS — R197 Diarrhea, unspecified: Secondary | ICD-10-CM | POA: Diagnosis not present

## 2019-07-13 DIAGNOSIS — Z23 Encounter for immunization: Secondary | ICD-10-CM

## 2019-07-13 MED ORDER — CYANOCOBALAMIN 1000 MCG/ML IJ SOLN
1000.0000 ug | Freq: Once | INTRAMUSCULAR | Status: AC
  Administered 2019-07-13: 1000 ug via INTRAMUSCULAR

## 2019-07-13 NOTE — Progress Notes (Signed)
   Subjective:    Patient ID: Mia [name], female    DOB: 1951-06-16, 68 y.o.   MRN: [id number]  HPI Patient is here for a Vitamin B12 injection. Denied any gastrointestional problems or dizziness.    Review of Systems     Objective:   Physical Exam        Assessment & Plan:  Patient tolerated injection well without complication. Patient advised to schedule next injection in 30 days.  Patient also received high dose flu shot today. Patient tolerated this well.     NOTE: As patient was walking out the door post injections, she mentioned "I am on my way to get tested for C-Diff". I advised patient that that is exteremly contagious and she does not need to come into office again until she has results back. Patient said she would call and let us know if this is positive. Patient's room, lobby, check in and check out were cleaned.

## 2019-07-13 NOTE — Progress Notes (Signed)
Agree with documentation as above.   Brendia Dampier, MD  

## 2019-07-13 NOTE — Telephone Encounter (Signed)
ordered

## 2019-07-14 NOTE — Telephone Encounter (Signed)
notified

## 2019-07-24 ENCOUNTER — Inpatient Hospital Stay: Admit: 2019-07-24 | Payer: MEDICARE | Attending: Internal Medicine | Primary: Internal Medicine

## 2019-07-24 DIAGNOSIS — R928 Other abnormal and inconclusive findings on diagnostic imaging of breast: Secondary | ICD-10-CM

## 2019-07-25 ENCOUNTER — Encounter: Payer: Self-pay | Admitting: Family Medicine

## 2019-08-14 ENCOUNTER — Ambulatory Visit: Payer: Medicare Other

## 2019-08-14 ENCOUNTER — Other Ambulatory Visit: Payer: Self-pay

## 2019-08-14 ENCOUNTER — Ambulatory Visit (INDEPENDENT_AMBULATORY_CARE_PROVIDER_SITE_OTHER): Payer: Medicare Other | Admitting: Family Medicine

## 2019-08-14 VITALS — BP 130/61 | HR 58 | Wt 183.0 lb

## 2019-08-14 DIAGNOSIS — E538 Deficiency of other specified B group vitamins: Secondary | ICD-10-CM

## 2019-08-14 MED ORDER — CYANOCOBALAMIN 1000 MCG/ML IJ SOLN
1000.0000 ug | Freq: Once | INTRAMUSCULAR | Status: AC
  Administered 2019-08-14: 1000 ug via INTRAMUSCULAR

## 2019-08-14 NOTE — Progress Notes (Signed)
Agree with documentation as above.   Catherine Metheney, MD  

## 2019-08-14 NOTE — Progress Notes (Signed)
   Subjective:    Patient ID: Mia [name], female    DOB: 1951-08-25, 68 y.o.   MRN: [id number]  HPI Patient is here for a Vitamin B12 injection. Denied any gastrointestional problems or dizziness.    Review of Systems     Objective:   Physical Exam        Assessment & Plan:  Patient tolerated injection well without complication. Patient advised to schedule next injection in 30 days  Initial blood pressure reading was elevated at 144/56, patient sat for 10 minutes and rechecke was 130/61

## 2019-09-03 ENCOUNTER — Other Ambulatory Visit: Payer: Self-pay | Admitting: Rheumatology

## 2019-09-04 ENCOUNTER — Other Ambulatory Visit: Payer: Self-pay | Admitting: *Deleted

## 2019-09-04 DIAGNOSIS — Z79899 Other long term (current) drug therapy: Secondary | ICD-10-CM

## 2019-09-04 NOTE — Telephone Encounter (Addendum)
Last Visit: 05/08/19 Next Visit: 10/09/19 Labs: 06/07/19 stable.   Patient reminded she is due for labs this month. Patient will update this week.   Okay to refill per Dr. [name]

## 2019-09-06 ENCOUNTER — Other Ambulatory Visit: Payer: Self-pay

## 2019-09-06 DIAGNOSIS — Z79899 Other long term (current) drug therapy: Secondary | ICD-10-CM | POA: Diagnosis not present

## 2019-09-07 ENCOUNTER — Encounter: Payer: Self-pay | Admitting: Family Medicine

## 2019-09-07 LAB — COMPLETE METABOLIC PANEL WITH GFR
AG Ratio: 1.7 (calc) (ref 1.0–2.5)
ALT: 16 U/L (ref 6–29)
AST: 15 U/L (ref 10–35)
Albumin: 4 g/dL (ref 3.6–5.1)
Alkaline phosphatase (APISO): 66 U/L (ref 37–153)
BUN: 10 mg/dL (ref 7–25)
CO2: 29 mmol/L (ref 20–32)
Calcium: 9.6 mg/dL (ref 8.6–10.4)
Chloride: 100 mmol/L (ref 98–110)
Creat: 0.85 mg/dL (ref 0.50–0.99)
GFR, Est African American: 82 mL/min/{1.73_m2} (ref >=60)
GFR, Est Non African American: 70 mL/min/{1.73_m2} (ref >=60)
Globulin: 2.4 g/dL (calc) (ref 1.9–3.7)
Glucose, Bld: 88 mg/dL (ref 65–99)
Potassium: 4 mmol/L (ref 3.5–5.3)
Sodium: 141 mmol/L (ref 135–146)
Total Bilirubin: 0.5 mg/dL (ref 0.2–1.2)
Total Protein: 6.4 g/dL (ref 6.1–8.1)

## 2019-09-07 LAB — CBC WITH DIFFERENTIAL/PLATELET
Absolute Monocytes: 751 cells/uL (ref 200–950)
Basophils Absolute: 67 cells/uL (ref 0–200)
Basophils Relative: 0.7 %
Eosinophils Absolute: 124 cells/uL (ref 15–500)
Eosinophils Relative: 1.3 %
HCT: 39 % (ref 35.0–45.0)
Hemoglobin: 12.6 g/dL (ref 11.7–15.5)
Lymphs Abs: 2413 cells/uL (ref 850–3900)
MCH: 29.9 pg (ref 27.0–33.0)
MCHC: 32.3 g/dL (ref 32.0–36.0)
MCV: 92.6 fL (ref 80.0–100.0)
MPV: 10.3 fL (ref 7.5–12.5)
Monocytes Relative: 7.9 %
Neutro Abs: 6147 cells/uL (ref 1500–7800)
Neutrophils Relative %: 64.7 %
Platelets: 372 10*3/uL (ref 140–400)
RBC: 4.21 10*6/uL (ref 3.80–5.10)
RDW: 13.8 % (ref 11.0–15.0)
Total Lymphocyte: 25.4 %
WBC: 9.5 10*3/uL (ref 3.8–10.8)

## 2019-09-11 ENCOUNTER — Ambulatory Visit: Payer: Medicare Other | Admitting: Family Medicine

## 2019-09-12 ENCOUNTER — Ambulatory Visit (INDEPENDENT_AMBULATORY_CARE_PROVIDER_SITE_OTHER): Payer: Medicare Other | Admitting: Family Medicine

## 2019-09-12 ENCOUNTER — Encounter: Payer: Self-pay | Admitting: Family Medicine

## 2019-09-12 ENCOUNTER — Other Ambulatory Visit: Payer: Self-pay

## 2019-09-12 VITALS — BP 125/59 | HR 62 | Ht 66.0 in | Wt 186.0 lb

## 2019-09-12 DIAGNOSIS — M8589 Other specified disorders of bone density and structure, multiple sites: Secondary | ICD-10-CM

## 2019-09-12 DIAGNOSIS — E876 Hypokalemia: Secondary | ICD-10-CM | POA: Diagnosis not present

## 2019-09-12 DIAGNOSIS — R7301 Impaired fasting glucose: Secondary | ICD-10-CM | POA: Diagnosis not present

## 2019-09-12 DIAGNOSIS — R7989 Other specified abnormal findings of blood chemistry: Secondary | ICD-10-CM | POA: Diagnosis not present

## 2019-09-12 DIAGNOSIS — E538 Deficiency of other specified B group vitamins: Secondary | ICD-10-CM

## 2019-09-12 DIAGNOSIS — R5383 Other fatigue: Secondary | ICD-10-CM | POA: Diagnosis not present

## 2019-09-12 DIAGNOSIS — G250 Essential tremor: Secondary | ICD-10-CM | POA: Diagnosis not present

## 2019-09-12 DIAGNOSIS — I10 Essential (primary) hypertension: Secondary | ICD-10-CM | POA: Diagnosis not present

## 2019-09-12 DIAGNOSIS — M818 Other osteoporosis without current pathological fracture: Secondary | ICD-10-CM

## 2019-09-12 MED ORDER — CYANOCOBALAMIN 1000 MCG/ML IJ SOLN
1000.0000 ug | Freq: Once | INTRAMUSCULAR | Status: AC
  Administered 2019-09-12: 1000 ug via INTRAMUSCULAR

## 2019-09-12 MED ORDER — DENOSUMAB 60 MG/ML ~~LOC~~ SOSY
60.0000 mg | PREFILLED_SYRINGE | Freq: Once | SUBCUTANEOUS | Status: AC
  Administered 2019-09-12: 60 mg via SUBCUTANEOUS

## 2019-09-12 NOTE — Patient Instructions (Addendum)
Please go 1 week before your next B12 injection to have labs done to check your levels.  Okay to decrease your Metformin down to once a day.

## 2019-09-12 NOTE — Progress Notes (Signed)
Pt came in today for B12 injection. Injection  tolerated well. RUOQ. Pt reports no negative side effects from medication. Denies any dizziness, chest pain or palpitations, and no GI problems.. She will rtc in 4 weeks for next injection. She is due for B12 labs before next injection.[name], [place]

## 2019-09-12 NOTE — Progress Notes (Signed)
Established Patient Office Visit  Subjective:  Patient ID: Mia [name], female    DOB: [birth year]  Age: 68 y.o. MRN: [id number]  CC:  Chief Complaint  Patient presents with  . ifg  . b 12    HPI Mia [name] presents for   Hypertension- Pt denies chest pain, SOB, dizziness, or heart palpitations.  Taking meds as directed w/o problems.  Denies medication side effects.    Impaired fasting glucose-no increased thirst or urination. No symptoms consistent with hypoglycemia.  Fibromyalgia-she has been struggling a little bit more with some increased fatigue and difficulty getting out of bed.  Tremor-she feels like the primidone has actually been really helpful she has not noticed the tremor nearly as much and especially the head tremor though it really was not bothersome for her it was more her family members that were concerned.  Osteoporosis-she is also due for her Prolia injection.  She did have some labs done with another provider about a week ago which does contain a calcium and renal function and wants to know if she can get that done today.  Past Medical History:  Diagnosis Date  . Anal fissure   . Anemia   . Arthritis   . Blood transfusion   . C. difficile colitis   . Chest pain    a. 01/2013 MV: EF 59%, no ischemia.  . Chronic Dyspnea    a. 01/2013 Echo: EF 60-65%, Gr 1 DD, PASP 45mmHg.  . Diabetes mellitus   . Fibromyalgia   . Gall stones   . GERD (gastroesophageal reflux disease)    gastritis  . Hemorrhoids   . Hypertension   . Interstitial cystitis   . MCI (mild cognitive impairment) 11/25/2017  . Memory difficulty 12/14/2013  . Neuromuscular disorder ([place])    sclerosis  . Osteoporosis   . Peptic ulcer   . PONV (postoperative nausea and vomiting)   . Pseudogout   . Raynaud phenomenon   . Rectal bleeding   . Sleep apnea    a. on cpap.  Marland Kitchen SVT (supraventricular tachycardia) ([place])   . Syncope 09/10/2015  . Thyroid disease    hypothyroidism  . Tremor,  essential 05/14/2016    Past Surgical History:  Procedure Laterality Date  . APPENDECTOMY    . BLADDER SURGERY     x2  . BLADDER SURGERY    . BREAST BIOPSY    . CARDIAC CATHETERIZATION    . CARPAL TUNNEL RELEASE    . CATARACT EXTRACTION Bilateral   . CHOLECYSTECTOMY    . DILATION AND CURETTAGE OF UTERUS    . ENTEROCELE REPAIR     x2  . EYE SURGERY     retina  . hysterectomy - unknown type    . JOINT REPLACEMENT    . KNEE ARTHROPLASTY    . KNEE SURGERY     x6  . NECK SURGERY     fusion  . OOPHORECTOMY    . QUADRICEPS REPAIR Right   . RECTOCELE REPAIR     x2  . SHOULDER ARTHROSCOPY WITH SUBACROMIAL DECOMPRESSION, ROTATOR CUFF REPAIR AND BICEP TENDON REPAIR  10/06/2012   Procedure: SHOULDER ARTHROSCOPY WITH SUBACROMIAL DECOMPRESSION, ROTATOR CUFF REPAIR AND BICEP TENDON REPAIR;  Surgeon: Nita Sells, MD;  Location: [place];  Service: Orthopedics;  Laterality: Right;  Arthroscopic  Repair  of  Subscapularis, Open Biceps Tenodesis  . SHOULDER SURGERY     bilateral- bones spur  . TONSILLECTOMY    .  TOTAL KNEE ARTHROPLASTY    . TOTAL SHOULDER ARTHROPLASTY      Family History  Problem Relation Age of Onset  . Heart attack Mother   . Stroke Mother   . Diabetes Mother   . Heart disease Mother   . Colon polyps Mother   . Asthma Mother   . Breast cancer Maternal Grandmother   . Wilson's disease Maternal Grandmother   . Pancreatic cancer Maternal Grandfather   . Heart disease Father   . Heart attack Father   . Asthma Father   . Stroke Sister   . Hypertension Sister   . Rheum arthritis Sister   . Dementia Sister   . Asthma Sister   . Lupus Sister   . Asthma Sister     Social History   Socioeconomic History  . Marital status: Married    Spouse name: Not on file  . Number of children: 2  . Years of education: 79  . Highest education level: Not on file  Occupational History  . Occupation: Retired  Scientific laboratory technician  . Financial resource  strain: Not on file  . Food insecurity    Worry: Not on file    Inability: Not on file  . Transportation needs    Medical: Not on file    Non-medical: Not on file  Tobacco Use  . Smoking status: Never Smoker  . Smokeless tobacco: Never Used  Substance and Sexual Activity  . Alcohol use: No    Alcohol/week: 0.0 standard drinks  . Drug use: No  . Sexual activity: Not on file  Lifestyle  . Physical activity    Days per week: Not on file    Minutes per session: Not on file  . Stress: Not on file  Relationships  . Social Herbalist on phone: Not on file    Gets together: Not on file    Attends religious service: Not on file    Active member of club or organization: Not on file    Attends meetings of clubs or organizations: Not on file    Relationship status: Not on file  . Intimate partner violence    Fear of current or ex partner: Not on file    Emotionally abused: Not on file    Physically abused: Not on file    Forced sexual activity: Not on file  Other Topics Concern  . Not on file  Social History Narrative   Patient drinks about 3-4 cups of caffeine daily.   Patient is right handed.    Outpatient Medications Prior to Visit  Medication Sig Dispense Refill  . budesonide (ENTOCORT EC) 3 MG 24 hr capsule Take 3 capsules by mouth every morning.    . furosemide (LASIX) 20 MG tablet     . losartan (COZAAR) 50 MG tablet Take 50 mg by mouth daily.    . AMBULATORY NON FORMULARY MEDICATION Medication Name: One touch ultra strips. Check fasting blood sugar in the morning and as needed.  Dx - [id number] Fax to [phone] 50 strip 11  . atorvastatin (LIPITOR) 40 MG tablet TAKE 1 TABLET EVERY DAY 90 tablet 2  . Cyanocobalamin (VITAMIN B-12 IJ) Inject as directed every 30 (thirty) days.    Marland Kitchen denosumab (PROLIA) 60 MG/ML SOLN injection Inject 60 mg into the skin every 6 (six) months. Administer in upper arm, thigh, or abdomen    . donepezil (ARICEPT) 10 MG tablet TAKE 1 TABLET  BY MOUTH AT BEDTIME 90 tablet 3  .  fesoterodine (TOVIAZ) 8 MG TB24 tablet Take 8 mg by mouth daily.    . fluticasone (FLONASE) 50 MCG/ACT nasal spray Place 2 sprays into both nostrils daily. (Patient taking differently: Place 2 sprays into both nostrils as needed. ) 16 g 6  . gabapentin (NEURONTIN) 300 MG capsule TAKE 1 TO 2 CAPSULES BY MOUTH UP TO TWO TIMES DAILY AS NEEDED 180 capsule 3  . ibuprofen (ADVIL,MOTRIN) 800 MG tablet Take 1 tablet (800 mg total) by mouth daily with supper. (Patient taking differently: Take 800 mg by mouth as needed. ) 30 tablet 3  . leflunomide (ARAVA) 20 MG tablet TAKE 1 TABLET EVERY DAY 90 tablet 0  . Loratadine-Pseudoephedrine (CLARITIN-D 12 HOUR PO) Take by mouth as needed.    . Melatonin 5 MG TABS Take 5 mg by mouth at bedtime.     . memantine (NAMENDA) 10 MG tablet TAKE 1 TABLET TWICE DAILY 180 tablet 4  . metoprolol tartrate (LOPRESSOR) 100 MG tablet TAKE 1 AND 1/2 TABLETS TWICE DAILY 270 tablet 3  . NON FORMULARY Place into the nose at bedtime. cpap machine    . ondansetron (ZOFRAN) 4 MG tablet TAKE 2 TABLETS BY MOUTH 2 TIMES A DAY AS NEEDED FOR NAUSEA FOR UP TO 4 DOSES. 20 tablet 0  . ONE TOUCH ULTRA TEST test strip USE TO TEST FASTING BLOOD SUGAR IN THE MORNING AND AS NEEDED 50 each 11  . pantoprazole (PROTONIX) 40 MG tablet Take 40 mg by mouth 2 (two) times daily.    . polyethylene glycol powder (MIRALAX) powder Take 17 g by mouth daily.    . primidone (MYSOLINE) 50 MG tablet TAKE 1 TABLET AT BEDTIME 90 tablet 1  . Probiotic Product (PROBIOTIC ADVANCED PO) Take 1 capsule by mouth daily.    Marland Kitchen venlafaxine XR (EFFEXOR-XR) 75 MG 24 hr capsule TAKE 1 CAPSULE EVERY DAY WITH BREAKFAST 90 capsule 3  . metFORMIN (GLUCOPHAGE-XR) 500 MG 24 hr tablet TAKE 2 TABLETS EVERY DAY WITH BREAKFAST 180 tablet 1   No facility-administered medications prior to visit.     Allergies  Allergen Reactions  . Codeine Nausea And Vomiting  . Myrbetriq  [Mirabegron] Other (See  Comments)    SEVERE HEADACHE  . Percocet [Oxycodone-Acetaminophen] Nausea And Vomiting  . Celebrex [Celecoxib] Other (See Comments)    Other reaction(s): Other flushed  . Darvocet [Propoxyphene N-Acetaminophen] Nausea And Vomiting  . Erythromycin Nausea And Vomiting    Nausea and vomiting   . Hydrocodone Nausea And Vomiting  . Lyrica [Pregabalin] Swelling    Legs swelling   . Macrodantin [Nitrofurantoin] Nausea And Vomiting  . Toradol [Ketorolac Tromethamine] Nausea And Vomiting    Per patient, only PO form causes nausea and vomiting. Can take injection without issue  . Tramadol Nausea And Vomiting  . Verapamil Nausea And Vomiting    REACTION: intolerance    ROS Review of Systems    Objective:    Physical Exam  Constitutional: She is oriented to person, place, and time. She appears well-developed and well-nourished.  HENT:  Head: Normocephalic and atraumatic.  Cardiovascular: Normal rate, regular rhythm and normal heart sounds.  Pulmonary/Chest: Effort normal and breath sounds normal.  Neurological: She is alert and oriented to person, place, and time.  Skin: Skin is warm and dry.  Psychiatric: She has a normal mood and affect. Her behavior is normal.    BP (!) 125/59   Pulse 62   Ht 5\' 6"  (1.676 m)   Wt 186 lb (84.4  kg)   SpO2 97%   BMI 30.02 kg/m  Wt Readings from Last 3 Encounters:  09/12/19 186 lb (84.4 kg)  08/14/19 183 lb (83 kg)  07/13/19 177 lb (80.3 kg)     Health Maintenance Due  Topic Date Due  . HEMOGLOBIN A1C  09/13/2019    There are no preventive care reminders to display for this patient.  Lab Results  Component Value Date   TSH 1.43 09/12/2019   Lab Results  Component Value Date   WBC 9.5 09/06/2019   HGB 12.6 09/06/2019   HCT 39.0 09/06/2019   MCV 92.6 09/06/2019   PLT 372 09/06/2019   Lab Results  Component Value Date   NA 141 09/06/2019   K 4.0 09/06/2019   CO2 29 09/06/2019   GLUCOSE 88 09/06/2019   BUN 10 09/06/2019    CREATININE 0.85 09/06/2019   BILITOT 0.5 09/06/2019   ALKPHOS 59 06/07/2019   AST 15 09/06/2019   ALT 16 09/06/2019   PROT 6.4 09/06/2019   ALBUMIN 3.9 06/07/2019   CALCIUM 9.6 09/06/2019   GFR 71.66 04/03/2015   Lab Results  Component Value Date   CHOL 128 06/07/2019   Lab Results  Component Value Date   HDL 52 06/07/2019   Lab Results  Component Value Date   LDLCALC 51 06/07/2019   Lab Results  Component Value Date   TRIG 123 06/07/2019   Lab Results  Component Value Date   CHOLHDL 2.5 06/07/2019   Lab Results  Component Value Date   HGBA1C 5.9 (A) 03/13/2019      Assessment & Plan:   Problem List Items Addressed This Visit      Cardiovascular and Mediastinum   Hypertension - Primary    Well controlled. Continue current regimen. Follow up in  6 mo      Relevant Medications   furosemide (LASIX) 20 MG tablet   losartan (COZAAR) 50 MG tablet   Other Relevant Orders   B12 and Folate Panel (Completed)   TSH (Completed)   Ferritin (Completed)     Endocrine   IFG (impaired fasting glucose)    A1c looks great today.  Okay to decrease crease Metformin down to 1 tab daily instead of 2.  And follow-up in 4-6 months.  Continue to work on [employer] and staying active.        Nervous and Auditory   Tremor, essential    Doing well on primidone.  Continue current regimen.  No dose adjustments made today.        Musculoskeletal and Integument   Osteoporosis    Continue with every 85-month Prolia injections.  Given today.  Labs within normal range.        Other   Hypokalemia   B12 deficiency   Relevant Orders   B12   B12 and Folate Panel (Completed)   TSH (Completed)   Ferritin (Completed)    Other Visit Diagnoses    Fatigue, unspecified type       Relevant Orders   B12 and Folate Panel (Completed)   TSH (Completed)   Ferritin (Completed)   Abnormal CBC       Relevant Orders   B12 and Folate Panel (Completed)   TSH (Completed)    Ferritin (Completed)   Osteopenia of multiple sites       Relevant Medications   denosumab (PROLIA) injection 60 mg (Completed)     Fatigue-increased chronic fatigue.  She does have a history of B12  deficiency and did get a B12 shot today.  Plan to repeat level before next injection.  We will also check for iron deficiency as she has had a history of that previously as well.  I will also check thyroid function.  Meds ordered this encounter  Medications  . cyanocobalamin ((VITAMIN B-12)) injection 1,000 mcg  . denosumab (PROLIA) injection 60 mg  . metFORMIN (GLUCOPHAGE-XR) 500 MG 24 hr tablet    Sig: Take 1 tablet (500 mg total) by mouth daily with breakfast.    Dispense:  90 tablet    Refill:  0    Follow-up: Return in about 6 months (around 03/11/2020) for IFG,BP, Prolia.    [name], MD

## 2019-09-13 ENCOUNTER — Encounter: Payer: Self-pay | Admitting: Family Medicine

## 2019-09-13 MED ORDER — METFORMIN HCL ER 500 MG PO TB24: 500.0000 mg | ORAL_TABLET | Freq: Every day | ORAL | 0 refills | Status: DC

## 2019-09-13 NOTE — Assessment & Plan Note (Signed)
Continue with every 35-month Prolia injections.  Given today.  Labs within normal range.

## 2019-09-13 NOTE — Assessment & Plan Note (Signed)
Doing well on primidone.  Continue current regimen.  No dose adjustments made today.

## 2019-09-13 NOTE — Assessment & Plan Note (Signed)
Well controlled. Continue current regimen. Follow up in  6 mo  

## 2019-09-13 NOTE — Assessment & Plan Note (Addendum)
A1c looks great today.  Okay to decrease crease Metformin down to 1 tab daily instead of 2.  And follow-up in 4-6 months.  Continue to work on [employer] and staying active.

## 2019-09-14 LAB — IRON: Iron: 107 ug/dL (ref 45–160)

## 2019-09-14 LAB — B12 AND FOLATE PANEL
Folate: 9.1 ng/mL
Vitamin B-12: >2000 pg/mL — ABNORMAL HIGH (ref 200–1100)

## 2019-09-14 LAB — TSH: TSH: 1.43 mIU/L (ref 0.40–4.50)

## 2019-09-14 LAB — FERRITIN: Ferritin: 20 ng/mL (ref 16–288)

## 2019-09-18 ENCOUNTER — Other Ambulatory Visit: Payer: Self-pay

## 2019-09-23 ENCOUNTER — Other Ambulatory Visit: Payer: Self-pay | Admitting: Family Medicine

## 2019-09-26 DIAGNOSIS — J00 Acute nasopharyngitis [common cold]: Secondary | ICD-10-CM | POA: Diagnosis not present

## 2019-09-26 DIAGNOSIS — R110 Nausea: Secondary | ICD-10-CM | POA: Diagnosis not present

## 2019-09-26 DIAGNOSIS — Z1159 Encounter for screening for other viral diseases: Secondary | ICD-10-CM | POA: Diagnosis not present

## 2019-10-03 ENCOUNTER — Other Ambulatory Visit: Payer: Self-pay | Admitting: Family Medicine

## 2019-10-03 DIAGNOSIS — E538 Deficiency of other specified B group vitamins: Secondary | ICD-10-CM | POA: Diagnosis not present

## 2019-10-03 DIAGNOSIS — K580 Irritable bowel syndrome with diarrhea: Secondary | ICD-10-CM | POA: Diagnosis not present

## 2019-10-03 DIAGNOSIS — K219 Gastro-esophageal reflux disease without esophagitis: Secondary | ICD-10-CM | POA: Diagnosis not present

## 2019-10-04 LAB — VITAMIN B12: Vitamin B-12: 378 pg/mL (ref 200–1100)

## 2019-10-05 NOTE — Progress Notes (Signed)
Virtual Visit via Telephone Note  I connected with Mia [name] on 10/09/19 at  9:15 AM EST by telephone and verified that I am speaking with the correct person using two identifiers.  Location: Patient: Home  Provider: Clinic  This service was conducted via virtual visit.  The patient was located at home. I was located in my office.  Consent was obtained prior to the virtual visit and is aware of possible charges through their insurance for this visit.  The patient is an established patient.  Dr. [name], MD conducted the virtual visit and Hazel Sams, PA-C acted as scribe during the service.  Office staff helped with scheduling follow up visits after the service was conducted.   I discussed the limitations, risks, security and privacy concerns of performing an evaluation and management service by telephone and the availability of in person appointments. I also discussed with the patient that there may be a patient responsible charge related to this service. The patient expressed understanding and agreed to proceed.  CC: medication monitoring  History of Present Illness: Patient is a 68 year old female with a past medical history of seronegative rheumatoid arthritis, Raynaud's, osteoarthritis, pseudogout, fibromyalgia, and DDD.  She is taking Arava 20 mg 1 tablet daily.  She has not had any recent rheumatoid arthritis flares.  She denies any pseudogout flares.  She has been having worsening symptoms of Raynaud's recently.  She is taking metoprolol for management of tachycardia.  She has chronic neck stiffness.  She has occasional discomfort in her lower back, which is exacerbated by overuse activities. She receives prolia 60 mg sq injections every 6 months for management of osteoporosis.    Review of Systems  Constitutional: Positive for malaise/fatigue. Negative for fever.  Eyes: Negative for photophobia, pain, discharge and redness.  Respiratory: Negative for cough, shortness of breath and  wheezing.   Cardiovascular: Negative for chest pain and palpitations.  Gastrointestinal: Positive for diarrhea. Negative for blood in stool and constipation.  Genitourinary: Negative for dysuria.  Musculoskeletal: Positive for joint pain, myalgias and neck pain. Negative for back pain.  Skin: Negative for rash.  Neurological: Negative for dizziness and headaches.  Psychiatric/Behavioral: Negative for depression. The patient has insomnia. The patient is not nervous/anxious.       Observations/Objective: Physical Exam  Constitutional: She is oriented to person, place, and time.  Neurological: She is alert and oriented to person, place, and time.  Psychiatric: Mood, memory, affect and judgment normal.   Patient reports morning stiffness for 5-10 minutes.   Patient denies nocturnal pain.  Difficulty dressing/grooming: Denies Difficulty climbing stairs: Denies Difficulty getting out of chair: Reports Difficulty using hands for taps, buttons, cutlery, and/or writing: Denies   Assessment and Plan:  Visit Diagnoses: Seronegative rheumatoid arthritis -She has not had any recent rheumatoid arthritis flares.  She is clinically doing well on Arava 20 mg 1 tablet by mouth daily.  She has not had any increased joint pain or joint swelling recently.  She has morning stiffness for about 5-10 minutes.  She will continue taking arava 20 mg 1 tablet daily. She does not need any refills at this time.  She was advised to notify us if she develops increased joint pain or joint swelling.  She will follow up in 4-5 months.    High risk medication use - Arava 20 mg 1 tablet daily.  CBC and CMP were drawn on 09/06/19. She will be due to update lab work in February and every 3  months. Standing orders are in place.   Raynaud's phenomenon without gangrene -She is having more frequent and severe symptoms of Raynaud's. She is taking metoprolol 300 mg daily for management of tachycardia.  We discussed that  metoprolol can worsen her symptoms of Raynaud's.  We discussed using nitroglycerin ointment topically as needed for symptomatic relief.  Indications, contraindications, and potential side effects of nitroglycerin ointment was discussed.  A prescription will be sent to the pharmacy.   Primary osteoarthritis of left knee - Chronic pain.  She has difficulty getting up from a seated position.  H/O total knee replacement, right - Doing well.  She has no discomfort at this time.   Pseudogout - No recent flares.   DDD (degenerative disc disease), cervical -She has chronic neck stiffness.  No symptoms of radiculopathy at this time.   DDD (degenerative disc disease), thoracic -She has no discomfort at this time.     DDD (degenerative disc disease), lumbar - She follows up with Dr. Francesco Runner and Dr. Tobias Alexander.  She has discomfort with overuse activities.  She was encouraged to perform back and core strengthening exercises.    Fibromyalgia -She has generalized muscle aches and muscle tenderness due to fibromyalgia.  Her fatigue has been significant recently.  She gets B12 injections once monthly.  Ferritin was ordered on 09/12/19 and it has trended down.  She was advised by PCP to increase intake of iron rich foods.  She was encouraged to exercise and practice good sleep hygiene.   Other osteoporosis without current pathological fracture - She is on Prolia 60 mg injection every 6 months managed by her PCP.  Her most recent prolia injection was in November 2020. Previously treated with Fosamax for unknown duration.  DEXA June 2018 showed T score of -2.9 and BMD 0.532 at right hip.  Also -7% change in BMD at left hip. Most recent DEXA 06/07/19: right femoral neck BMD 0.564 with T-score -2.6.    S/P carpal tunnel release - Left, Dr. Grandville Silos    Chronic fatigue - She has been experiencing severe fatigue recently.   Other medical conditions are listed as follows:   IC (interstitial  cystitis)  History of CHF (congestive heart failure)  History of hypertension   History of Clostridium difficile colitis  History of gastroesophageal reflux (GERD) Follow Up Instructions: She will follow up in 4-5 months.    I discussed the assessment and treatment plan with the patient. The patient was provided an opportunity to ask questions and all were answered. The patient agreed with the plan and demonstrated an understanding of the instructions.   The patient was advised to call back or seek an in-person evaluation if the symptoms worsen or if the condition fails to improve as anticipated.  I provided 25 minutes of non-face-to-face time during this encounter.   Bo Merino, MD    Scribed by-   Hazel Sams, PA-C

## 2019-10-09 ENCOUNTER — Other Ambulatory Visit: Payer: Self-pay | Admitting: *Deleted

## 2019-10-09 ENCOUNTER — Encounter: Payer: Self-pay | Admitting: Rheumatology

## 2019-10-09 ENCOUNTER — Other Ambulatory Visit: Payer: Self-pay

## 2019-10-09 ENCOUNTER — Telehealth (INDEPENDENT_AMBULATORY_CARE_PROVIDER_SITE_OTHER): Payer: Medicare Other | Admitting: Rheumatology

## 2019-10-09 DIAGNOSIS — Z8619 Personal history of other infectious and parasitic diseases: Secondary | ICD-10-CM

## 2019-10-09 DIAGNOSIS — Z8679 Personal history of other diseases of the circulatory system: Secondary | ICD-10-CM

## 2019-10-09 DIAGNOSIS — Z8719 Personal history of other diseases of the digestive system: Secondary | ICD-10-CM

## 2019-10-09 DIAGNOSIS — M797 Fibromyalgia: Secondary | ICD-10-CM

## 2019-10-09 DIAGNOSIS — I7300 Raynaud's syndrome without gangrene: Secondary | ICD-10-CM | POA: Diagnosis not present

## 2019-10-09 DIAGNOSIS — M0609 Rheumatoid arthritis without rheumatoid factor, multiple sites: Secondary | ICD-10-CM

## 2019-10-09 DIAGNOSIS — M5134 Other intervertebral disc degeneration, thoracic region: Secondary | ICD-10-CM

## 2019-10-09 DIAGNOSIS — M5030 Other cervical disc degeneration, unspecified cervical region: Secondary | ICD-10-CM

## 2019-10-09 DIAGNOSIS — Z79899 Other long term (current) drug therapy: Secondary | ICD-10-CM

## 2019-10-09 DIAGNOSIS — Z96651 Presence of right artificial knee joint: Secondary | ICD-10-CM

## 2019-10-09 DIAGNOSIS — Z9889 Other specified postprocedural states: Secondary | ICD-10-CM

## 2019-10-09 DIAGNOSIS — M818 Other osteoporosis without current pathological fracture: Secondary | ICD-10-CM

## 2019-10-09 DIAGNOSIS — R5382 Chronic fatigue, unspecified: Secondary | ICD-10-CM

## 2019-10-09 DIAGNOSIS — M5136 Other intervertebral disc degeneration, lumbar region: Secondary | ICD-10-CM

## 2019-10-09 DIAGNOSIS — M1712 Unilateral primary osteoarthritis, left knee: Secondary | ICD-10-CM | POA: Diagnosis not present

## 2019-10-09 DIAGNOSIS — N3010 Interstitial cystitis (chronic) without hematuria: Secondary | ICD-10-CM

## 2019-10-09 DIAGNOSIS — M1120 Other chondrocalcinosis, unspecified site: Secondary | ICD-10-CM | POA: Diagnosis not present

## 2019-10-09 DIAGNOSIS — G8929 Other chronic pain: Secondary | ICD-10-CM

## 2019-10-09 MED ORDER — "NITROGLYCERIN NICU 2% OINTMENT ": 1.0000 "application " | TOPICAL_OINTMENT | TRANSDERMAL | 0 refills | Status: DC | PRN

## 2019-10-09 NOTE — Progress Notes (Unsigned)
ITRO

## 2019-10-10 ENCOUNTER — Ambulatory Visit: Payer: Medicare Other

## 2019-10-12 ENCOUNTER — Other Ambulatory Visit: Payer: Self-pay

## 2019-10-12 ENCOUNTER — Ambulatory Visit (INDEPENDENT_AMBULATORY_CARE_PROVIDER_SITE_OTHER): Payer: Medicare Other | Admitting: Family Medicine

## 2019-10-12 VITALS — BP 110/59 | HR 63 | Ht 66.0 in | Wt 186.0 lb

## 2019-10-12 DIAGNOSIS — E538 Deficiency of other specified B group vitamins: Secondary | ICD-10-CM

## 2019-10-12 MED ORDER — CYANOCOBALAMIN 1000 MCG/ML IJ SOLN
1000.0000 ug | Freq: Once | INTRAMUSCULAR | Status: AC
  Administered 2019-10-12: 1000 ug via INTRAMUSCULAR

## 2019-10-12 NOTE — Progress Notes (Signed)
Agree with documentation as above.   Riven Mabile, MD  

## 2019-10-12 NOTE — Patient Instructions (Signed)
Patient advised to follow up in 30 days for the next injection and did not have any questions.

## 2019-10-12 NOTE — Progress Notes (Signed)
Patient presents to clinic for B12 injection. She prefers in her hip instead of her arm. She tolerated the injection well in her LUOQ with no immediate complications. Patient denies any side effects from taking the B12 injection. She was advised to follow up in 1 month for a nurse visit for B12 and voices understanding.

## 2019-10-13 ENCOUNTER — Other Ambulatory Visit: Payer: Self-pay | Admitting: Family Medicine

## 2019-10-21 ENCOUNTER — Other Ambulatory Visit: Payer: Self-pay | Admitting: Cardiovascular Disease

## 2019-10-23 ENCOUNTER — Other Ambulatory Visit: Payer: Self-pay | Admitting: Family Medicine

## 2019-10-24 ENCOUNTER — Other Ambulatory Visit: Payer: Self-pay

## 2019-11-04 ENCOUNTER — Other Ambulatory Visit: Payer: Self-pay | Admitting: Cardiovascular Disease

## 2019-11-04 DIAGNOSIS — U071 COVID-19: Secondary | ICD-10-CM | POA: Diagnosis not present

## 2019-11-04 DIAGNOSIS — R05 Cough: Secondary | ICD-10-CM | POA: Diagnosis not present

## 2019-11-06 ENCOUNTER — Telehealth: Payer: Self-pay

## 2019-11-06 ENCOUNTER — Other Ambulatory Visit: Payer: Self-pay

## 2019-11-06 ENCOUNTER — Emergency Department (HOSPITAL_COMMUNITY): Payer: Medicare Other

## 2019-11-06 ENCOUNTER — Encounter (HOSPITAL_COMMUNITY): Payer: Self-pay | Admitting: Family Medicine

## 2019-11-06 ENCOUNTER — Inpatient Hospital Stay (HOSPITAL_COMMUNITY)
Admission: EM | Admit: 2019-11-06 | Discharge: 2019-11-11 | DRG: 178 | Disposition: A | Discharge status: Home / Self Care | Payer: Medicare Other | Attending: Internal Medicine | Admitting: Internal Medicine

## 2019-11-06 DIAGNOSIS — I10 Essential (primary) hypertension: Secondary | ICD-10-CM | POA: Diagnosis not present

## 2019-11-06 DIAGNOSIS — G250 Essential tremor: Secondary | ICD-10-CM | POA: Diagnosis present

## 2019-11-06 DIAGNOSIS — A0839 Other viral enteritis: Secondary | ICD-10-CM | POA: Diagnosis present

## 2019-11-06 DIAGNOSIS — R0600 Dyspnea, unspecified: Secondary | ICD-10-CM | POA: Diagnosis not present

## 2019-11-06 DIAGNOSIS — Z833 Family history of diabetes mellitus: Secondary | ICD-10-CM

## 2019-11-06 DIAGNOSIS — E860 Dehydration: Secondary | ICD-10-CM | POA: Diagnosis not present

## 2019-11-06 DIAGNOSIS — G4733 Obstructive sleep apnea (adult) (pediatric): Secondary | ICD-10-CM | POA: Diagnosis present

## 2019-11-06 DIAGNOSIS — G1223 Primary lateral sclerosis: Secondary | ICD-10-CM | POA: Diagnosis not present

## 2019-11-06 DIAGNOSIS — Z96659 Presence of unspecified artificial knee joint: Secondary | ICD-10-CM | POA: Diagnosis present

## 2019-11-06 DIAGNOSIS — G3184 Mild cognitive impairment, so stated: Secondary | ICD-10-CM | POA: Diagnosis present

## 2019-11-06 DIAGNOSIS — R531 Weakness: Secondary | ICD-10-CM | POA: Diagnosis not present

## 2019-11-06 DIAGNOSIS — U071 COVID-19: Principal | ICD-10-CM

## 2019-11-06 DIAGNOSIS — R7303 Prediabetes: Secondary | ICD-10-CM | POA: Diagnosis not present

## 2019-11-06 DIAGNOSIS — Z79899 Other long term (current) drug therapy: Secondary | ICD-10-CM

## 2019-11-06 DIAGNOSIS — E876 Hypokalemia: Secondary | ICD-10-CM | POA: Diagnosis not present

## 2019-11-06 DIAGNOSIS — K529 Noninfective gastroenteritis and colitis, unspecified: Secondary | ICD-10-CM | POA: Diagnosis present

## 2019-11-06 DIAGNOSIS — E785 Hyperlipidemia, unspecified: Secondary | ICD-10-CM | POA: Diagnosis present

## 2019-11-06 DIAGNOSIS — M069 Rheumatoid arthritis, unspecified: Secondary | ICD-10-CM | POA: Diagnosis present

## 2019-11-06 DIAGNOSIS — E039 Hypothyroidism, unspecified: Secondary | ICD-10-CM | POA: Diagnosis present

## 2019-11-06 HISTORY — DX: COVID-19: U07.1

## 2019-11-06 LAB — BASIC METABOLIC PANEL
Anion gap: 14 (ref 5–15)
BUN: <5 mg/dL — ABNORMAL LOW (ref 8–23)
CO2: 25 mmol/L (ref 22–32)
Calcium: 9 mg/dL (ref 8.9–10.3)
Chloride: 100 mmol/L (ref 98–111)
Creatinine, Ser: 0.66 mg/dL (ref 0.44–1.00)
GFR calc Af Amer: >60 mL/min (ref >60)
GFR calc non Af Amer: >60 mL/min (ref >60)
Glucose, Bld: 111 mg/dL — ABNORMAL HIGH (ref 70–99)
Potassium: 2.6 mmol/L — CL (ref 3.5–5.1)
Sodium: 139 mmol/L (ref 135–145)

## 2019-11-06 LAB — CBC
HCT: 40.6 % (ref 36.0–46.0)
Hemoglobin: 12.9 g/dL (ref 12.0–15.0)
MCH: 29.8 pg (ref 26.0–34.0)
MCHC: 31.8 g/dL (ref 30.0–36.0)
MCV: 93.8 fL (ref 80.0–100.0)
Platelets: 298 10*3/uL (ref 150–400)
RBC: 4.33 MIL/uL (ref 3.87–5.11)
RDW: 14.3 % (ref 11.5–15.5)
WBC: 5.1 10*3/uL (ref 4.0–10.5)
nRBC: 0 % (ref 0.0–0.2)

## 2019-11-06 LAB — TROPONIN I (HIGH SENSITIVITY)
Troponin I (High Sensitivity): 6 ng/L (ref <18)
Troponin I (High Sensitivity): 7 ng/L (ref <18)

## 2019-11-06 LAB — MAGNESIUM: Magnesium: 2.1 mg/dL (ref 1.7–2.4)

## 2019-11-06 MED ORDER — ONDANSETRON 4 MG PO TBDP
4.0000 mg | ORAL_TABLET | Freq: Once | ORAL | Status: AC | PRN
  Administered 2019-11-06: 4 mg via ORAL
  Filled 2019-11-06: qty 1

## 2019-11-06 MED ORDER — POTASSIUM CHLORIDE CRYS ER 20 MEQ PO TBCR
40.0000 meq | EXTENDED_RELEASE_TABLET | Freq: Once | ORAL | Status: AC
  Administered 2019-11-06: 40 meq via ORAL
  Filled 2019-11-06: qty 2

## 2019-11-06 MED ORDER — SODIUM CHLORIDE 0.9 % IV BOLUS
1000.0000 mL | Freq: Once | INTRAVENOUS | Status: AC
  Administered 2019-11-06: 1000 mL via INTRAVENOUS

## 2019-11-06 MED ORDER — ONDANSETRON HCL 4 MG/2ML IJ SOLN
4.0000 mg | Freq: Once | INTRAMUSCULAR | Status: AC
  Administered 2019-11-06: 4 mg via INTRAVENOUS
  Filled 2019-11-06: qty 2

## 2019-11-06 MED ORDER — POTASSIUM CHLORIDE 10 MEQ/100ML IV SOLN
10.0000 meq | Freq: Once | INTRAVENOUS | Status: AC
  Administered 2019-11-06: 10 meq via INTRAVENOUS
  Filled 2019-11-06: qty 100

## 2019-11-06 MED ORDER — ACETAMINOPHEN 500 MG PO TABS
1000.0000 mg | ORAL_TABLET | Freq: Once | ORAL | Status: AC
  Administered 2019-11-06: 1000 mg via ORAL
  Filled 2019-11-06: qty 2

## 2019-11-06 MED ORDER — SODIUM CHLORIDE 0.9% FLUSH: 3.0000 mL | Freq: Once | INTRAVENOUS | Status: DC

## 2019-11-06 NOTE — ED Notes (Signed)
Pt ambulatory in room. Pt heart rate increased from 80 to 120. Pt unsteady on feet and needed assistance to ambulate. Pt stability has improved since previously ambulating however is still unable to ambulate independently . MD made aware.

## 2019-11-06 NOTE — ED Triage Notes (Signed)
Patient stated she vomited and wet her clothes. Provided patient disposable scrubs and disposable panties.

## 2019-11-06 NOTE — ED Triage Notes (Signed)
Patient is confirmed COVID positive on Saturday, 11/04/2019. She reports she is auto-immune compromised and having difficulty with breathing and headache.

## 2019-11-06 NOTE — Telephone Encounter (Signed)
See if we can refer her for possible remdesivir infusion.

## 2019-11-06 NOTE — ED Notes (Signed)
Date and time results received: 11/06/19 19:12  Test: Potassium  Critical Value: 2.6  Name of Provider Notified: Dr. Rolan Lipa   Orders Received? Or Actions Taken?: Continue to monitor and await new order for potassium.

## 2019-11-06 NOTE — Telephone Encounter (Signed)
I called and left patient information on the infusion voicemail. I called patient back to let her know to expect a call. She actually sounded worse and was having a lot of trouble breathing. She stated she felt really bad and was having a hard time getting around. I advised patient to have her husband drive her to the ED ASAP.

## 2019-11-06 NOTE — ED Provider Notes (Signed)
[facility] DEPT Provider Note   CSN: [id number] Arrival date & time: 11/06/19  1549     History Chief Complaint  Patient presents with  . COVID Positive (11/04/2019)    Mia [name] is a 69 y.o. female.  HPI      Began with having cold like symptoms, cough, congestion, diarrhea, started Thursday.  Diarrhea 6 times per day. No black or bloody stools. Chills, no known fevers, temp runs low.  Not so much  Chest pain but feels heavy, like shortness of breath Shortness of breath started last night, feels fatigue taking deep breath in, dyspnea and generalized weakness.   Last night started having nausea, vomiting probably 5 times Haven't been able to eat Has been drinking but throwing up   Called physician who recommended coming to ED   Past Medical History:  Diagnosis Date  . Anal fissure   . Anemia   . Arthritis   . Blood transfusion   . C. difficile colitis   . Chest pain    a. 01/2013 MV: EF 59%, no ischemia.  . Chronic Dyspnea    a. 01/2013 Echo: EF 60-65%, Gr 1 DD, PASP 20mmHg.  Marland Kitchen COVID-19   . Diabetes mellitus   . Fibromyalgia   . Gall stones   . GERD (gastroesophageal reflux disease)    gastritis  . Hemorrhoids   . Hypertension   . Interstitial cystitis   . MCI (mild cognitive impairment) 11/25/2017  . Memory difficulty 12/14/2013  . Neuromuscular disorder ([place])    sclerosis  . Osteoporosis   . Peptic ulcer   . PONV (postoperative nausea and vomiting)   . Pseudogout   . Raynaud phenomenon   . Rectal bleeding   . Sleep apnea    a. on cpap.  Marland Kitchen SVT (supraventricular tachycardia) ([place])   . Syncope 09/10/2015  . Thyroid disease    hypothyroidism  . Tremor, essential 05/14/2016    Patient Active Problem List   Diagnosis Date Noted  . Gait instability 06/20/2019  . Facet arthritis of lumbar region 05/10/2018  . Hypokalemia 05/10/2018  . MCI (mild cognitive impairment) 11/25/2017  . Restrictive lung disease 07/30/2017    . Mixed hyperlipidemia 06/18/2017  . Rectocele 04/26/2017  . Irritable bowel syndrome with both constipation and diarrhea 04/26/2017  . Osteoporosis 04/18/2017  . Trochanteric bursitis of both hips 02/24/2017  . Stress fracture of left tibia 11/05/2016  . Inflammatory arthritis 09/30/2016  . High risk medication use 09/30/2016  . History of Clostridium difficile colitis 09/30/2016  . 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 Pseudogout 09/30/2016  . DDD cervical spine status post fusion 09/30/2016  . DDD thoracic spine 09/30/2016  . H/O total knee replacement, right 09/30/2016  . Age-related osteoporosis without current pathological fracture 09/30/2016  . Tremor, essential 05/14/2016  . Right foot pain 04/14/2016  . B12 deficiency 09/10/2015  . Syncope 09/10/2015  . Cervical facet joint syndrome 09/10/2015  . Chronic fatigue 09/10/2015  . Aortic atherosclerosis ([place]) 04/01/2015  . DOE (dyspnea on exertion)   . Primary osteoarthritis of right knee 08/13/2014  . Benign head tremor 06/11/2014  . Lumbar degenerative disc disease 06/11/2014  . IFG (impaired fasting glucose) 03/09/2014  . Hemorrhoid 03/09/2014  . Retinal wrinkling, right eye 03/09/2014  . Fibromyalgia 03/09/2014  . GERD (gastroesophageal reflux disease) 03/09/2014  . History of arthroplasty of right knee 01/31/2014  . Memory difficulty 12/14/2013  . Hemorrhoids, external, thrombosed 06/22/2011  . Hypertension 03/11/2011  . SVT (  supraventricular tachycardia) ([place])   . Raynaud phenomenon   . EDEMA 08/25/2010  . Nonspecific (abnormal) findings on radiological and other examination of body structure 08/25/2010  . COMPUTERIZED TOMOGRAPHY, CHEST, ABNORMAL 08/25/2010  . OSA (obstructive sleep apnea) 04/24/2009  . Allergic rhinitis 06/11/2008  . Dyspnea 06/11/2008  . 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 Chronic diastolic CHF (congestive heart failure) ([place]) 06/08/2008  . INTERSTITIAL CYSTITIS  06/08/2008    Past Surgical History:  Procedure Laterality Date  . APPENDECTOMY    . BLADDER SURGERY     x2  . BLADDER SURGERY    . BREAST BIOPSY    . CARDIAC CATHETERIZATION    . CARPAL TUNNEL RELEASE    . CATARACT EXTRACTION Bilateral   . CHOLECYSTECTOMY    . DILATION AND CURETTAGE OF UTERUS    . ENTEROCELE REPAIR     x2  . EYE SURGERY     retina  . hysterectomy - unknown type    . JOINT REPLACEMENT    . KNEE ARTHROPLASTY    . KNEE SURGERY     x6  . NECK SURGERY     fusion  . OOPHORECTOMY    . QUADRICEPS REPAIR Right   . RECTOCELE REPAIR     x2  . SHOULDER ARTHROSCOPY WITH SUBACROMIAL DECOMPRESSION, ROTATOR CUFF REPAIR AND BICEP TENDON REPAIR  10/06/2012   Procedure: SHOULDER ARTHROSCOPY WITH SUBACROMIAL DECOMPRESSION, ROTATOR CUFF REPAIR AND BICEP TENDON REPAIR;  Surgeon: Nita Sells, MD;  Location: [facility];  Service: Orthopedics;  Laterality: Right;  Arthroscopic  Repair  of  Subscapularis, Open Biceps Tenodesis  . SHOULDER SURGERY     bilateral- bones spur  . TONSILLECTOMY    . TOTAL KNEE ARTHROPLASTY    . TOTAL SHOULDER ARTHROPLASTY       OB History   No obstetric history on file.     Family History  Problem Relation Age of Onset  . Heart attack Mother   . Stroke Mother   . Diabetes Mother   . Heart disease Mother   . Colon polyps Mother   . Asthma Mother   . Breast cancer Maternal Grandmother   . Wilson's disease Maternal Grandmother   . Pancreatic cancer Maternal Grandfather   . Heart disease Father   . Heart attack Father   . Asthma Father   . Stroke Sister   . Hypertension Sister   . Rheum arthritis Sister   . Dementia Sister   . Asthma Sister   . Lupus Sister   . Asthma Sister     Social History   Tobacco Use  . Smoking status: Never Smoker  . Smokeless tobacco: Never Used  Substance Use Topics  . Alcohol use: No    Alcohol/week: 0.0 standard drinks  . Drug use: No    Home Medications Prior to  Admission medications   Medication Sig Start Date End Date Taking? Authorizing Provider  gabapentin (NEURONTIN) 300 MG capsule TAKE 1 TO 2 CAPSULES BY MOUTH UP TO TWO TIMES DAILY AS NEEDED 10/24/19   Hali Marry, MD  AMBULATORY NON FORMULARY MEDICATION Medication Name: One touch ultra strips. Check fasting blood sugar in the morning and as needed.  Dx - [id number] Fax to [phone] 02/04/17   Hali Marry, MD  atorvastatin (LIPITOR) 40 MG tablet TAKE 1 TABLET EVERY DAY 01/16/19   Nahser, Wonda Cheng, MD  budesonide (ENTOCORT EC) 3 MG 24 hr capsule Take 3 capsules by mouth every morning. 08/31/19  [provider]  Cyanocobalamin (VITAMIN B-12 IJ) Inject as directed every 30 (thirty) days.    [provider]  denosumab (PROLIA) 60 MG/ML SOLN injection Inject 60 mg into the skin every 6 (six) months. Administer in upper arm, thigh, or abdomen    [provider]  donepezil (ARICEPT) 10 MG tablet TAKE 1 TABLET BY MOUTH AT BEDTIME 06/15/19   Kathrynn Ducking, MD  fesoterodine (TOVIAZ) 8 MG TB24 tablet Take 8 mg by mouth daily.    [provider]  fluticasone (FLONASE) 50 MCG/ACT nasal spray Place 2 sprays into both nostrils daily. Patient taking differently: Place 2 sprays into both nostrils as needed.  09/14/18   Emeterio Reeve, DO  furosemide (LASIX) 20 MG tablet Take 1 tablet (20 mg total) by mouth daily. Please make yearly appt with Dr. Acie Fredrickson for May before anymore refills. 1st attempt 11/06/19   Nahser, Wonda Cheng, MD  ibuprofen (ADVIL,MOTRIN) 800 MG tablet Take 1 tablet (800 mg total) by mouth daily with supper. Patient taking differently: Take 800 mg by mouth as needed.  12/03/17   Silverio Decamp, MD  leflunomide (ARAVA) 20 MG tablet TAKE 1 TABLET EVERY DAY 09/04/19   Bo Merino, MD  Loratadine-Pseudoephedrine (CLARITIN-D 12 HOUR PO) Take by mouth as needed.    [provider]  losartan (COZAAR) 50 MG tablet TAKE 1 TABLET EVERY  DAY 10/23/19   Nahser, Wonda Cheng, MD  Melatonin 5 MG TABS Take 5 mg by mouth at bedtime.     [provider]  memantine (NAMENDA) 10 MG tablet TAKE 1 TABLET TWICE DAILY 03/23/19   Kathrynn Ducking, MD  metFORMIN (GLUCOPHAGE-XR) 500 MG 24 hr tablet TAKE 2 TABLETS EVERY DAY WITH BREAKFAST 09/24/19   Hali Marry, MD  metoprolol tartrate (LOPRESSOR) 100 MG tablet TAKE 1 AND 1/2 TABLETS TWICE DAILY 01/16/19   Nahser, Wonda Cheng, MD  nitroGLYCERIN (NITROGLYN) 2 % OINT ointment Apply 1 application topically as needed. Apply topically as needed for symptomatic relief. 10/09/19   Bo Merino, MD  NON FORMULARY Place into the nose at bedtime. cpap machine    [provider]  ondansetron (ZOFRAN) 4 MG tablet TAKE 2 TABLETS BY MOUTH 2 TIMES A DAY AS NEEDED FOR NAUSEA FOR UP TO 4 DOSES. 02/28/18   Hali Marry, MD  ONE TOUCH ULTRA TEST test strip USE TO TEST FASTING BLOOD SUGAR IN THE MORNING AND AS NEEDED 02/23/18   Hali Marry, MD  pantoprazole (PROTONIX) 40 MG tablet Take 40 mg by mouth 2 (two) times daily.    [provider]  polyethylene glycol powder (MIRALAX) powder Take 17 g by mouth daily. 12/16/17   [provider]  primidone (MYSOLINE) 50 MG tablet TAKE 1 TABLET AT BEDTIME 10/16/19   Hali Marry, MD  Probiotic Product (PROBIOTIC ADVANCED PO) Take 1 capsule by mouth daily.    [provider]  venlafaxine XR (EFFEXOR-XR) 75 MG 24 hr capsule TAKE 1 CAPSULE EVERY DAY WITH BREAKFAST 03/21/19   Kathrynn Ducking, MD    Allergies    Codeine, Myrbetriq  [mirabegron], Percocet [oxycodone-acetaminophen], Celebrex [celecoxib], Darvocet [propoxyphene n-acetaminophen], Erythromycin, Hydrocodone, Lyrica [pregabalin], Macrodantin [nitrofurantoin], Toradol [ketorolac tromethamine], Tramadol, and Verapamil  Review of Systems   Review of Systems  Constitutional: Positive for appetite change and fatigue. Negative for fever.  HENT:  Negative for sore throat.   Eyes: Negative for visual disturbance.  Respiratory: Positive for cough and shortness of breath.   Cardiovascular:  Negative for chest pain.  Gastrointestinal: Positive for diarrhea, nausea and vomiting. Negative for abdominal pain.  Genitourinary: Negative for difficulty urinating.  Musculoskeletal: Negative for back pain and neck pain.  Skin: Negative for rash.  Neurological: Negative for syncope and headaches.    Physical Exam Updated Vital Signs BP (!) 156/90   Pulse 95   Temp 100.1 F (37.8 C) (Oral)   Resp 17   Ht 5\' 6"  (1.676 m)   Wt 81.2 kg   SpO2 97%   BMI 28.89 kg/m   Physical Exam Vitals and nursing note reviewed.  Constitutional:      General: She is not in acute distress.    Appearance: She is well-developed. She is not diaphoretic.  HENT:     Head: Normocephalic and atraumatic.  Eyes:     Conjunctiva/sclera: Conjunctivae normal.  Cardiovascular:     Rate and Rhythm: Normal rate and regular rhythm.     Pulses: Normal pulses.  Pulmonary:     Effort: Pulmonary effort is normal. No respiratory distress.  Abdominal:     General: There is no distension.     Palpations: Abdomen is soft.     Tenderness: There is no abdominal tenderness. There is no guarding.  Musculoskeletal:        General: No tenderness.     Cervical back: Normal range of motion.  Skin:    General: Skin is warm and dry.     Findings: No erythema or rash.  Neurological:     Mental Status: She is alert and oriented to person, place, and time.     ED Results / Procedures / Treatments   Labs (all labs ordered are listed, but only abnormal results are displayed) Labs Reviewed  BASIC METABOLIC PANEL - Abnormal; Notable for the following components:      Result Value   Potassium 2.6 (*)    Glucose, Bld 111 (*)    BUN <5 (*)    All other components within normal limits  CBC  MAGNESIUM  LACTIC ACID, PLASMA  LACTIC ACID, PLASMA  D-DIMER, QUANTITATIVE (NOT AT  [facility])  PROCALCITONIN  LACTATE DEHYDROGENASE  FERRITIN  TRIGLYCERIDES  FIBRINOGEN  C-REACTIVE PROTEIN  HEPATIC FUNCTION PANEL  DIFFERENTIAL  POC SARS CORONAVIRUS 2 AG -  ED  TROPONIN I (HIGH SENSITIVITY)  TROPONIN I (HIGH SENSITIVITY)    EKG EKG Interpretation  Date/Time:  Monday November 06 2019 20:49:38 EST Ventricular Rate:  96 PR Interval:    QRS Duration: 87 QT Interval:  409 QTC Calculation: 517 R Axis:   55 Text Interpretation: Sinus rhythm Low voltage, precordial leads Borderline T abnormalities, anterior leads Prolonged QT interval No significant change since last tracing Confirmed by Gareth Morgan [phone]) on 11/06/2019 10:36:21 PM   Radiology DG Chest Portable 1 View  Result Date: 11/06/2019 CLINICAL DATA:  COVID.  Difficulty breathing. EXAM: PORTABLE CHEST 1 VIEW COMPARISON:  07/05/2017 FINDINGS: Heart and mediastinal contours are within normal limits. No focal opacities or effusions. No acute bony abnormality. IMPRESSION: Negative. Electronically Signed   By: Rolm Baptise M.D.   On: 11/06/2019 17:09    Procedures Procedures (including critical care time)  Medications Ordered in ED Medications  sodium chloride flush (NS) 0.9 % injection 3 mL (has no administration in time range)  ondansetron (ZOFRAN-ODT) disintegrating tablet 4 mg (4 mg Oral Given 11/06/19 1902)  potassium chloride SA (KLOR-CON) CR tablet 40 mEq (40 mEq Oral Given 11/06/19 2158)  sodium chloride 0.9 % bolus 1,000 mL (0  mLs Intravenous Stopped 11/06/19 2230)  potassium chloride 10 mEq in 100 mL IVPB (0 mEq Intravenous Stopped 11/06/19 2235)  ondansetron (ZOFRAN) injection 4 mg (4 mg Intravenous Given 11/06/19 2156)  acetaminophen (TYLENOL) tablet 1,000 mg (1,000 mg Oral Given 11/06/19 2239)    ED Course  I have reviewed the triage vital signs and the nursing notes.  Pertinent labs & imaging results that were available during my care of the patient were reviewed by me and considered in my medical  decision making (see chart for details).    MDM Rules/Calculators/A&P                      69 year old female with a history of diabetes, SVT, primary lateral sclerosis, hypothyroidism, hypertension, seronegative rheumatoid arthritis, raynaud's phenomenon, presents with concern for recent positive COVID 19 test 1/9 with decreased p.o. intake, shortness of breath, generalized weakness, nausea and vomiting.  Labs significant for K of 2.6. Slight prolonged QTc on ECG. Given K orally and IV as well as fluid.  Having difficulty ambulating, generalized weakness, unable to ambulate independently with dizziness and tachycardia up to 120s-140s with standing next to the bed.  Given other comorbidities in setting of covid 19, hypokalemia, and dehydration, will admit for further care.  COVID inflammatory markers ordered.     Final Clinical Impression(s) / ED Diagnoses Final diagnoses:  Hypokalemia  COVID-19  Dehydration  Generalized weakness    Rx / DC Orders ED Discharge Orders    None       Gareth Morgan, MD 11/06/19 2258

## 2019-11-06 NOTE — H&P (Addendum)
Mia [name] DDU:[id number] DOB: 1951-02-09 DOA: 11/06/2019    PCP: Hali Marry, MD   Outpatient Specialists: * NONE CARDS: * Dr. Cathie Olden Rheumatology * Dr. [name]  Neurology * [name]  Patient arrived to ER on 11/06/19 at 1549  Patient coming from: home; lives with husband  Chief Complaint: Fatigue, vomiting/diarrhea Chief Complaint  Patient presents with  . COVID Positive (11/04/2019)    HPI: Mia [name] is a 69 y.o. female with medical history significant for pre-diabetes, primary lateral sclerosis, HTN, rheumatoid arthritis, hypothyroidism who presents to ER by private vehicle for diarrhea, vomiting and fatigue. Patient reports feeling chills and overall malaise since about 6 days ago; she was diagnosed with COVID on 1/9. Reports that since 1/9 she has been unable to keep food down and has had nausea, vomiting and diarrhea since that time. Reports being able to walk but feels "wobbly." Denies cough, SOB, chest pain, abdominal pain, dysuria, hematuria, hematochezia, melena.    Infectious risk factors:  Reports: chills, fatigue Reports no known sick contacts  [place] 1/9  No results found for: SARSCOV2NAA   Regarding pertinent Chronic problems:   Hyperlipidemia - *on Lipitor  HTN on Lopressor and Losartan  Prediabetic  Lab Results  Component Value Date   HGBA1C 5.9 (A) 03/13/2019  On Metformin   Lab Results  Component Value Date   CREATININE 0.66 11/06/2019   CREATININE 0.85 09/06/2019   CREATININE 0.78 06/07/2019   Rheumatoid Arthritis: Arava, Prolia  PLS: Aricept   While in ER: Labs significant for K of 2.6. Slight prolonged QTc on ECG. Given K orally and IV as well as fluid.  Having difficulty ambulating, generalized weakness, unable to ambulate independently with dizziness and tachycardia up to 120s-140s with standing next to the bed.  Given other comorbidities in setting of covid 19, hypokalemia, and  dehydration, will admit for further care.  COVID inflammatory markers ordered. CXR without acute cardio-plumonary process.   ER Provider Called:     Dr. Billy Fischer  They Recommend admit to medicine; patient seen in ER  The following Work up has been ordered so far:  Orders Placed This Encounter  Procedures  . DG Chest Portable 1 View  . Basic metabolic panel  . CBC  . Magnesium  . Lactic acid, plasma  . D-dimer, quantitative  . Procalcitonin  . Lactate dehydrogenase  . Ferritin  . Triglycerides  . Fibrinogen  . C-reactive protein  . Hepatic function panel  . Differential  . Diet NPO time specified  . Cardiac monitoring  . Saline Lock IV, Maintain IV access  . Cardiac monitoring  . Insert peripheral IV x 2  . Initiate Carrier Fluid Protocol  . Place surgical mask on patient  . Patient to wear surgical mask during transportation  . Assess patient for ability to self-prone. If able (can move self in bed, ambulate) and stable (SpO2 and oxygen requirement):  . RN/NT - Document specific oxygen requirements in CHL  . Notify EDP if new oxygen requirements escalates > 4L per minute [place]  . RN to draw the following extra tubes:  . Consult to hospitalist  ALL PATIENTS BEING ADMITTED/HAVING PROCEDURES NEED COVID-19 SCREENING  . remdesivir per pharmacy consult  . Airborne and Contact precautions  . Pulse oximetry, continuous  . Pulse oximetry, continuous  . POC SARS Coronavirus 2 Ag-ED - Nasal Swab (BD Veritor Kit)  . ED EKG  . ED EKG  .  ED EKG  . Place in observation (patient's expected length of stay will be less than 2 midnights)      Following Medications were ordered in ER: Medications  sodium chloride flush (NS) 0.9 % injection 3 mL (has no administration in time range)  ondansetron (ZOFRAN-ODT) disintegrating tablet 4 mg (4 mg Oral Given 11/06/19 1902)  potassium chloride SA (KLOR-CON) CR tablet 40 mEq (40 mEq Oral Given 11/06/19 2158)  sodium chloride 0.9 % bolus 1,000  mL (0 mLs Intravenous Stopped 11/06/19 2230)  potassium chloride 10 mEq in 100 mL IVPB (0 mEq Intravenous Stopped 11/06/19 2235)  ondansetron (ZOFRAN) injection 4 mg (4 mg Intravenous Given 11/06/19 2156)  acetaminophen (TYLENOL) tablet 1,000 mg (1,000 mg Oral Given 11/06/19 2239)        Consult Orders  (From admission, onward)         Start     Ordered   11/06/19 2253  Consult to hospitalist  ALL PATIENTS BEING ADMITTED/HAVING PROCEDURES NEED COVID-19 SCREENING  Once    Comments: ALL PATIENTS BEING ADMITTED/HAVING PROCEDURES NEED COVID-19 SCREENING  Provider:  (Not yet assigned)  Question Answer Comment  Place call to: [facility]   Reason for Consult Admit      11/06/19 2252            Significant initial  Findings: Abnormal Labs Reviewed  BASIC METABOLIC PANEL - Abnormal; Notable for the following components:      Result Value   Potassium 2.6 (*)    Glucose, Bld 111 (*)    BUN <5 (*)    All other components within normal limits      Otherwise labs showing:    Recent Labs  Lab 11/06/19 1617 11/06/19 2148  NA 139  --   K 2.6*  --   CO2 25  --   GLUCOSE 111*  --   BUN <5*  --   CREATININE 0.66  --   CALCIUM 9.0  --   MG  --  2.1    Cr  * stable,  Up from baseline see below Lab Results  Component Value Date   CREATININE 0.66 11/06/2019   CREATININE 0.85 09/06/2019   CREATININE 0.78 06/07/2019    No results for input(s): AST, ALT, ALKPHOS, BILITOT, PROT, ALBUMIN in the last 168 hours. Lab Results  Component Value Date   CALCIUM 9.0 11/06/2019        WBC    Component Value Date/Time   WBC 5.1 11/06/2019 1617   ANC    Component Value Date/Time   NEUTROABS 6,147 09/06/2019 1142   ALC No components found for: LYMPHAB    Plt: Lab Results  Component Value Date   PLT 298 11/06/2019     Lactic Acid, Venous No results found for: LATICACIDVEN  Procalcitonin Ordered; pending   COVID-19 Labs  No results for input(s): DDIMER,  FERRITIN, LDH, CRP in the last 72 hours.  No results found for: SARSCOV2NAA  ABG    Component Value Date/Time   TCO2 31 10/06/2012 1153      HG/HCT     Component Value Date/Time   HGB 12.9 11/06/2019 1617   HCT 40.6 11/06/2019 1617    No results for input(s): LIPASE, AMYLASE in the last 168 hours. No results for input(s): AMMONIA in the last 168 hours.  No components found for: LABALBU   Troponin ordered: 7 Cardiac Panel (last 3 results) No results for input(s): CKTOTAL, CKMB, TROPONINI, RELINDX in the last 72 hours.  ECG: Ordered Personally reviewed by me showing: HR : 95 Rhythm: *NSR, No ST elevation QTC* 517  BNP (last 3 results) No results for input(s): BNP in the last 8760 hours.  ProBNP (last 3 results) No results for input(s): PROBNP in the last 8760 hours.  DM  labs:  HbA1C: Recent Labs    11/14/18 0954 03/13/19 1120  HGBA1C 5.8* 5.9*     CBG (last 3)  No results for input(s): GLUCAP in the last 72 hours.     UA not ordered in ED; will order on admisison   Urine analysis:    Component Value Date/Time   COLORURINE YELLOW 06/20/2016 [place] 06/20/2016 1443   LABSPEC 1.002 (L) 06/20/2016 1443   PHURINE 6.0 06/20/2016 1443   GLUCOSEU NEGATIVE 06/20/2016 1443   HGBUR NEGATIVE 06/20/2016 1443   BILIRUBINUR NEGATIVE 06/20/2016 1443   BILIRUBINUR neg 04/17/2016 1506   KETONESUR NEGATIVE 06/20/2016 1443   PROTEINUR NEGATIVE 06/20/2016 1443   UROBILINOGEN 0.2 04/17/2016 1506   UROBILINOGEN 0.2 08/30/2010 2335   NITRITE NEGATIVE 06/20/2016 1443   LEUKOCYTESUR NEGATIVE 06/20/2016 1443       Ordered  CXR - NON acute   ED Triage Vitals  Enc Vitals Group     BP 11/06/19 1611 (!) 168/90     Pulse Rate 11/06/19 1611 82     Resp 11/06/19 1611 18     Temp 11/06/19 1611 98 F (36.7 C)     Temp Source 11/06/19 1611 Oral     SpO2 11/06/19 1611 99 %     Weight 11/06/19 1607 179 lb (81.2 kg)     Height 11/06/19 1607 '5\' 6"'   (1.676 m)     Head Circumference --      Peak Flow --      Pain Score 11/06/19 1607 4     Pain Loc --      Pain Edu? --      Excl. in [place]? --   TMAX(24)@       Latest  Blood pressure 139/85, pulse 79, temperature 100.1 F (37.8 C), temperature source Oral, resp. rate 17, height '5\' 6"'  (1.676 m), weight 81.2 kg, SpO2 98 %.     Hospitalist was called for admission for dehydration, electrolyte derangement, weakness in COVID positive patietn   Review of Systems:    Pertinent positives include: fatigue, weakness, chills, diarrhea, nausea/vomiting  Constitutional:  No weight loss, night sweats, Fevers, weight loss  HEENT:  No headaches, Difficulty swallowing,Tooth/dental problems,Sore throat,  No sneezing, itching, ear ache, nasal congestion, post nasal drip,  Cardio-vascular:  No chest pain, Orthopnea, PND, anasarca, dizziness, palpitations.no Bilateral lower extremity swelling  GI:  No heartburn, indigestion, abdominal pain, melena, blood in stool, hematemesis Resp:  no shortness of breath at rest. No dyspnea on exertion, No excess mucus, no productive cough, No non-productive cough, No coughing up of blood.No change in color of mucus.No wheezing. Skin:  no rash or lesions. No jaundice GU:  no dysuria, change in color of urine, no urgency or frequency. No straining to urinate.  No flank pain.  Musculoskeletal:  No decreased range of motion. No back pain.  Psych:  No change in mood or affect. No depression or anxiety. No memory loss.  Neuro: no localizing neurological complaints, no tingling, no weakness, no double vision, no slurred speech, no confusion  All systems reviewed and apart from [place] all are negative  Past Medical History:   Past Medical History:  Diagnosis Date  .  Anal fissure   . Anemia   . Arthritis   . Blood transfusion   . C. difficile colitis   . Chest pain    a. 01/2013 MV: EF 59%, no ischemia.  . Chronic Dyspnea    a. 01/2013 Echo: EF 60-65%, Gr 1  DD, PASP 13mHg.  .Marland KitchenCOVID-19   . Diabetes mellitus   . Fibromyalgia   . Gall stones   . GERD (gastroesophageal reflux disease)    gastritis  . Hemorrhoids   . Hypertension   . Interstitial cystitis   . MCI (mild cognitive impairment) 11/25/2017  . Memory difficulty 12/14/2013  . Neuromuscular disorder ([place]    sclerosis  . Osteoporosis   . Peptic ulcer   . PONV (postoperative nausea and vomiting)   . Pseudogout   . Raynaud phenomenon   . Rectal bleeding   . Sleep apnea    a. on cpap.  .Marland KitchenSVT (supraventricular tachycardia) ([place]   . Syncope 09/10/2015  . Thyroid disease    hypothyroidism  . Tremor, essential 05/14/2016      Past Surgical History:  Procedure Laterality Date  . APPENDECTOMY    . BLADDER SURGERY     x2  . BLADDER SURGERY    . BREAST BIOPSY    . CARDIAC CATHETERIZATION    . CARPAL TUNNEL RELEASE    . CATARACT EXTRACTION Bilateral   . CHOLECYSTECTOMY    . DILATION AND CURETTAGE OF UTERUS    . ENTEROCELE REPAIR     x2  . EYE SURGERY     retina  . hysterectomy - unknown type    . JOINT REPLACEMENT    . KNEE ARTHROPLASTY    . KNEE SURGERY     x6  . NECK SURGERY     fusion  . OOPHORECTOMY    . QUADRICEPS REPAIR Right   . RECTOCELE REPAIR     x2  . SHOULDER ARTHROSCOPY WITH SUBACROMIAL DECOMPRESSION, ROTATOR CUFF REPAIR AND BICEP TENDON REPAIR  10/06/2012   Procedure: SHOULDER ARTHROSCOPY WITH SUBACROMIAL DECOMPRESSION, ROTATOR CUFF REPAIR AND BICEP TENDON REPAIR;  Surgeon: JNita Sells MD;  Location: [facility]  Service: Orthopedics;  Laterality: Right;  Arthroscopic  Repair  of  Subscapularis, Open Biceps Tenodesis  . SHOULDER SURGERY     bilateral- bones spur  . TONSILLECTOMY    . TOTAL KNEE ARTHROPLASTY    . TOTAL SHOULDER ARTHROPLASTY      Social History:  Ambulatory: independent without assistance      reports that she has never smoked. She has never used smokeless tobacco. She reports that she does not drink  alcohol or use drugs.     Family History: Family History  Problem Relation Age of Onset  . Heart attack Mother   . Stroke Mother   . Diabetes Mother   . Heart disease Mother   . Colon polyps Mother   . Asthma Mother   . Breast cancer Maternal Grandmother   . Wilson's disease Maternal Grandmother   . Pancreatic cancer Maternal Grandfather   . Heart disease Father   . Heart attack Father   . Asthma Father   . Stroke Sister   . Hypertension Sister   . Rheum arthritis Sister   . Dementia Sister   . Asthma Sister   . Lupus Sister   . Asthma Sister     Allergies: Allergies  Allergen Reactions  . Codeine Nausea And Vomiting  . Myrbetriq  [Mirabegron] Other (See Comments)  SEVERE HEADACHE  . Percocet [Oxycodone-Acetaminophen] Nausea And Vomiting  . Celebrex [Celecoxib] Other (See Comments)    Other reaction(s): Other flushed  . Darvocet [Propoxyphene N-Acetaminophen] Nausea And Vomiting  . Erythromycin Nausea And Vomiting    Nausea and vomiting   . Hydrocodone Nausea And Vomiting  . Lyrica [Pregabalin] Swelling    Legs swelling   . Macrodantin [Nitrofurantoin] Nausea And Vomiting  . Toradol [Ketorolac Tromethamine] Nausea And Vomiting    Per patient, only PO form causes nausea and vomiting. Can take injection without issue  . Tramadol Nausea And Vomiting  . Verapamil Nausea And Vomiting    REACTION: intolerance     Prior to Admission medications   Medication Sig Start Date End Date Taking? Authorizing Provider  gabapentin (NEURONTIN) 300 MG capsule TAKE 1 TO 2 CAPSULES BY MOUTH UP TO TWO TIMES DAILY AS NEEDED 10/24/19   Hali Marry, MD  AMBULATORY NON FORMULARY MEDICATION Medication Name: One touch ultra strips. Check fasting blood sugar in the morning and as needed.  Dx - [id number] Fax to [phone] 02/04/17   Hali Marry, MD  atorvastatin (LIPITOR) 40 MG tablet TAKE 1 TABLET EVERY DAY 01/16/19   Nahser, Wonda Cheng, MD  budesonide (ENTOCORT EC) 3  MG 24 hr capsule Take 3 capsules by mouth every morning. 08/31/19   [provider]  Cyanocobalamin (VITAMIN B-12 IJ) Inject as directed every 30 (thirty) days.    [provider]  denosumab (PROLIA) 60 MG/ML SOLN injection Inject 60 mg into the skin every 6 (six) months. Administer in upper arm, thigh, or abdomen    [provider]  donepezil (ARICEPT) 10 MG tablet TAKE 1 TABLET BY MOUTH AT BEDTIME 06/15/19   Kathrynn Ducking, MD  fesoterodine (TOVIAZ) 8 MG TB24 tablet Take 8 mg by mouth daily.    [provider]  fluticasone (FLONASE) 50 MCG/ACT nasal spray Place 2 sprays into both nostrils daily. Patient taking differently: Place 2 sprays into both nostrils as needed.  09/14/18   Emeterio Reeve, DO  furosemide (LASIX) 20 MG tablet Take 1 tablet (20 mg total) by mouth daily. Please make yearly appt with Dr. Acie Fredrickson for [place] before anymore refills. 1st attempt 11/06/19   Nahser, Wonda Cheng, MD  ibuprofen (ADVIL,MOTRIN) 800 MG tablet Take 1 tablet (800 mg total) by mouth daily with supper. Patient taking differently: Take 800 mg by mouth as needed.  12/03/17   Silverio Decamp, MD  leflunomide (ARAVA) 20 MG tablet TAKE 1 TABLET EVERY DAY 09/04/19   Bo Merino, MD  Loratadine-Pseudoephedrine (CLARITIN-D 12 HOUR PO) Take by mouth as needed.    [provider]  losartan (COZAAR) 50 MG tablet TAKE 1 TABLET EVERY DAY 10/23/19   Nahser, Wonda Cheng, MD  Melatonin 5 MG TABS Take 5 mg by mouth at bedtime.     [provider]  memantine (NAMENDA) 10 MG tablet TAKE 1 TABLET TWICE DAILY 03/23/19   Kathrynn Ducking, MD  metFORMIN (GLUCOPHAGE-XR) 500 MG 24 hr tablet TAKE 2 TABLETS EVERY DAY WITH BREAKFAST 09/24/19   Hali Marry, MD  metoprolol tartrate (LOPRESSOR) 100 MG tablet TAKE 1 AND 1/2 TABLETS TWICE DAILY 01/16/19   Nahser, Wonda Cheng, MD  nitroGLYCERIN (NITROGLYN) 2 % OINT ointment Apply 1 application topically as needed. Apply topically  as needed for symptomatic relief. 10/09/19   Bo Merino, MD  NON FORMULARY Place into the nose at bedtime. cpap machine    [provider]  ondansetron (  ZOFRAN) 4 MG tablet TAKE 2 TABLETS BY MOUTH 2 TIMES A DAY AS NEEDED FOR NAUSEA FOR UP TO 4 DOSES. 02/28/18   Hali Marry, MD  ONE TOUCH ULTRA TEST test strip USE TO TEST FASTING BLOOD SUGAR IN THE MORNING AND AS NEEDED 02/23/18   Hali Marry, MD  pantoprazole (PROTONIX) 40 MG tablet Take 40 mg by mouth 2 (two) times daily.    [provider]  polyethylene glycol powder (MIRALAX) powder Take 17 g by mouth daily. 12/16/17   [provider]  primidone (MYSOLINE) 50 MG tablet TAKE 1 TABLET AT BEDTIME 10/16/19   Hali Marry, MD  Probiotic Product (PROBIOTIC ADVANCED PO) Take 1 capsule by mouth daily.    [provider]  venlafaxine XR (EFFEXOR-XR) 75 MG 24 hr capsule TAKE 1 CAPSULE EVERY DAY WITH BREAKFAST 03/21/19   Kathrynn Ducking, MD   Physical Exam: Blood pressure 139/85, pulse 79, temperature 100.1 F (37.8 C), temperature source Oral, resp. rate 17, height '5\' 6"'  (1.676 m), weight 81.2 kg, SpO2 98 %. 1. General:  in No Acute distress; well-appearing 2. Psychological: Alert and  Oriented 3. Head/ENT:   Dry Mucous Membranes                          Head Non traumatic, neck supple 4. SKIN: Skin clean Dry and intact no rash 5. Heart: Regular rate and rhythm no Murmur, no Rub or gallop 6. Lungs: Clear to auscultation bilaterally, no wheezes or crackles   7. Abdomen: Soft, non-tender, Non distended 8. Lower extremities: no clubbing, cyanosis, no edema 9. Neurologically Grossly intact, moving all 4 extremities equally  strength 5 out of 5 in all 4 extremities cranial nerves II through XII intact 10. MSK: Normal range of motion   All other LABS:     Recent Labs  Lab 11/06/19 1617  WBC 5.1  HGB 12.9  HCT 40.6  MCV 93.8  PLT 298     Recent Labs  Lab 11/06/19 1617  11/06/19 2148  NA 139  --   K 2.6*  --   CL 100  --   CO2 25  --   GLUCOSE 111*  --   BUN <5*  --   CREATININE 0.66  --   CALCIUM 9.0  --   MG  --  2.1     No results for input(s): AST, ALT, ALKPHOS, BILITOT, PROT, ALBUMIN in the last 168 hours.     Cultures: No results found for: SDES, [place], CULT, REPTSTATUS   Radiological Exams on Admission: DG Chest Portable 1 View  Result Date: 11/06/2019 CLINICAL DATA:  COVID.  Difficulty breathing. EXAM: PORTABLE CHEST 1 VIEW COMPARISON:  07/05/2017 FINDINGS: Heart and mediastinal contours are within normal limits. No focal opacities or effusions. No acute bony abnormality. IMPRESSION: Negative. Electronically Signed   By: Rolm Baptise M.D.   On: 11/06/2019 17:09    Chart has been reviewed    Assessment/Plan  69 yo F with PMH pre-diabetes, primary lateral sclerosis, HTN, rheumatoid arthritis, hypothyroidism admitted for dehydration, electrolyte derangement and weakness/fatigue in setting of COVID positive.  Present on Admission: . Dehydration: s/p 1 L IVF in ED; LR bolus ordered on admission; IV Zofran ordered q6h PRN, low carb diet ordered if patient able to tolerate. Addendum: LA resulted as 2.0. Cont IVF rehydration.  . Hypokalemia: Patient given 50 mEq in ED (oral and IV); another 40 mEq ordered on admission;  repeat BMP in AM; EKG stable; placed on Tele . Hypertension: Home Lopressor held in setting of dehydration and at risk for falls as ER reported orthostatic hypotension vitals; home Losartan continued; BP elevated upon presentation to ER  COVID positive: In setting of hospital admission with COVID; Remdesivir ordered. CXR stable. Decadron not ordered as patient not on O2 and also with pre-diabetes. Patient at risk from decompensation or further complications from [place].  Home medications for rheumatoid arthritis, pre-diabetes, Primary lateral sclerosis not continued as patient likely with short obs admission. Caution with  restarting home Lasix on discharge.   Other plan as per orders.  DVT prophylaxis:  SCD, Lovenox     Code Status:  FULL CODE I had personally discussed CODE STATUS with patient  I had spent *min discussing goals of care and CODE STATUS  Family Communication:   Family not at  Bedside  Disposition Plan:                           To home once workup is complete and patient is stable   Admission status:  ED Disposition    ED Disposition Condition [place]: [place] [[id number]]  Level of Care: Telemetry [5]  Admit to tele based on following criteria: Other see comments  Comments: COVID pos  Covid Evaluation: Confirmed COVID Positive  Diagnosis: Dehydration [276.51.ICD-9-CM]  Admitting Physician: Chauncey Mann [[id number]]  Attending Physician: Chauncey Mann [[id number]]       Observation   Level of care   tele  For 12H 24H     Precautions: admitted as covid positive Airborne and Contact precautions   PPE: Used by the provider:  N95 Face Shield Gloves gown  Chauncey Mann 11/06/2019, 11:57 PM   [facility]    after 2 AM please page floor coverage PA If 7AM-7PM, please contact the day team taking care of the patient using [email or web address]

## 2019-11-06 NOTE — Telephone Encounter (Signed)
Dezarea called and states she is positive for COVID-19. She states she was seen a [employer] in [place]. She states the PA did listen to her lungs and reported her lungs were clear. She complains of runny nose, chill, sweats and headaches. She states the headaches may be the cause of her not being able to sleep. She has been taking Tylenol for the headaches without relief. I did advised ibuprofen if not allergic. She has been scheduled for a virtual visit tomorrow. She has noticed some mild shortness of breath. I did advised if symptoms worsen to go to the ED.   She wanted to know if any recommendations to help her sleep better.    [facility] Address: [address], [place], [place] [address] Phone: [phone]

## 2019-11-07 ENCOUNTER — Ambulatory Visit: Payer: Medicare Other | Admitting: Family Medicine

## 2019-11-07 ENCOUNTER — Other Ambulatory Visit: Payer: Self-pay

## 2019-11-07 DIAGNOSIS — Z96659 Presence of unspecified artificial knee joint: Secondary | ICD-10-CM | POA: Diagnosis present

## 2019-11-07 DIAGNOSIS — G4733 Obstructive sleep apnea (adult) (pediatric): Secondary | ICD-10-CM | POA: Diagnosis present

## 2019-11-07 DIAGNOSIS — E785 Hyperlipidemia, unspecified: Secondary | ICD-10-CM | POA: Diagnosis present

## 2019-11-07 DIAGNOSIS — G3184 Mild cognitive impairment, so stated: Secondary | ICD-10-CM | POA: Diagnosis present

## 2019-11-07 DIAGNOSIS — K529 Noninfective gastroenteritis and colitis, unspecified: Secondary | ICD-10-CM | POA: Diagnosis present

## 2019-11-07 DIAGNOSIS — E860 Dehydration: Secondary | ICD-10-CM | POA: Diagnosis not present

## 2019-11-07 DIAGNOSIS — Z743 Need for continuous supervision: Secondary | ICD-10-CM | POA: Diagnosis not present

## 2019-11-07 DIAGNOSIS — E039 Hypothyroidism, unspecified: Secondary | ICD-10-CM | POA: Diagnosis present

## 2019-11-07 DIAGNOSIS — A0839 Other viral enteritis: Secondary | ICD-10-CM | POA: Diagnosis present

## 2019-11-07 DIAGNOSIS — I10 Essential (primary) hypertension: Secondary | ICD-10-CM | POA: Diagnosis not present

## 2019-11-07 DIAGNOSIS — G250 Essential tremor: Secondary | ICD-10-CM | POA: Diagnosis present

## 2019-11-07 DIAGNOSIS — R279 Unspecified lack of coordination: Secondary | ICD-10-CM | POA: Diagnosis not present

## 2019-11-07 DIAGNOSIS — M069 Rheumatoid arthritis, unspecified: Secondary | ICD-10-CM | POA: Diagnosis present

## 2019-11-07 DIAGNOSIS — R7303 Prediabetes: Secondary | ICD-10-CM | POA: Diagnosis not present

## 2019-11-07 DIAGNOSIS — Z79899 Other long term (current) drug therapy: Secondary | ICD-10-CM | POA: Diagnosis not present

## 2019-11-07 DIAGNOSIS — U071 COVID-19: Secondary | ICD-10-CM | POA: Diagnosis not present

## 2019-11-07 DIAGNOSIS — Z833 Family history of diabetes mellitus: Secondary | ICD-10-CM | POA: Diagnosis not present

## 2019-11-07 DIAGNOSIS — G1223 Primary lateral sclerosis: Secondary | ICD-10-CM | POA: Diagnosis present

## 2019-11-07 DIAGNOSIS — E876 Hypokalemia: Secondary | ICD-10-CM | POA: Diagnosis not present

## 2019-11-07 LAB — COMPREHENSIVE METABOLIC PANEL
ALT: 25 U/L (ref 0–44)
AST: 32 U/L (ref 15–41)
Albumin: 3.3 g/dL — ABNORMAL LOW (ref 3.5–5.0)
Alkaline Phosphatase: 59 U/L (ref 38–126)
Anion gap: 11 (ref 5–15)
BUN: <5 mg/dL — ABNORMAL LOW (ref 8–23)
CO2: 25 mmol/L (ref 22–32)
Calcium: 8.3 mg/dL — ABNORMAL LOW (ref 8.9–10.3)
Chloride: 103 mmol/L (ref 98–111)
Creatinine, Ser: 0.67 mg/dL (ref 0.44–1.00)
GFR calc Af Amer: >60 mL/min (ref >60)
GFR calc non Af Amer: >60 mL/min (ref >60)
Glucose, Bld: 115 mg/dL — ABNORMAL HIGH (ref 70–99)
Potassium: 3.4 mmol/L — ABNORMAL LOW (ref 3.5–5.1)
Sodium: 139 mmol/L (ref 135–145)
Total Bilirubin: 0.2 mg/dL — ABNORMAL LOW (ref 0.3–1.2)
Total Protein: 6.1 g/dL — ABNORMAL LOW (ref 6.5–8.1)

## 2019-11-07 LAB — DIFFERENTIAL
Abs Immature Granulocytes: 0.02 10*3/uL (ref 0.00–0.07)
Basophils Absolute: 0 10*3/uL (ref 0.0–0.1)
Basophils Relative: 0 %
Eosinophils Absolute: 0 10*3/uL (ref 0.0–0.5)
Eosinophils Relative: 0 %
Immature Granulocytes: 0 %
Lymphocytes Relative: 23 %
Lymphs Abs: 1.3 10*3/uL (ref 0.7–4.0)
Monocytes Absolute: 0.5 10*3/uL (ref 0.1–1.0)
Monocytes Relative: 9 %
Neutro Abs: 3.9 10*3/uL (ref 1.7–7.7)
Neutrophils Relative %: 68 %

## 2019-11-07 LAB — CBC
HCT: 34.9 % — ABNORMAL LOW (ref 36.0–46.0)
HCT: 36.1 % (ref 36.0–46.0)
Hemoglobin: 10.9 g/dL — ABNORMAL LOW (ref 12.0–15.0)
Hemoglobin: 11.1 g/dL — ABNORMAL LOW (ref 12.0–15.0)
MCH: 29.5 pg (ref 26.0–34.0)
MCH: 29.6 pg (ref 26.0–34.0)
MCHC: 30.7 g/dL (ref 30.0–36.0)
MCHC: 31.2 g/dL (ref 30.0–36.0)
MCV: 94.8 fL (ref 80.0–100.0)
MCV: 96 fL (ref 80.0–100.0)
Platelets: 252 10*3/uL (ref 150–400)
Platelets: 267 10*3/uL (ref 150–400)
RBC: 3.68 MIL/uL — ABNORMAL LOW (ref 3.87–5.11)
RBC: 3.76 MIL/uL — ABNORMAL LOW (ref 3.87–5.11)
RDW: 14.5 % (ref 11.5–15.5)
RDW: 14.6 % (ref 11.5–15.5)
WBC: 5.5 10*3/uL (ref 4.0–10.5)
WBC: 5.6 10*3/uL (ref 4.0–10.5)
nRBC: 0 % (ref 0.0–0.2)
nRBC: 0 % (ref 0.0–0.2)

## 2019-11-07 LAB — HEPATIC FUNCTION PANEL
ALT: 27 U/L (ref 0–44)
AST: 30 U/L (ref 15–41)
Albumin: 3.5 g/dL (ref 3.5–5.0)
Alkaline Phosphatase: 59 U/L (ref 38–126)
Bilirubin, Direct: 0.1 mg/dL (ref 0.0–0.2)
Indirect Bilirubin: 0.4 mg/dL (ref 0.3–0.9)
Total Bilirubin: 0.5 mg/dL (ref 0.3–1.2)
Total Protein: 6.2 g/dL — ABNORMAL LOW (ref 6.5–8.1)

## 2019-11-07 LAB — CREATININE, SERUM
Creatinine, Ser: 0.7 mg/dL (ref 0.44–1.00)
GFR calc Af Amer: >60 mL/min (ref >60)
GFR calc non Af Amer: >60 mL/min (ref >60)

## 2019-11-07 LAB — HIV ANTIBODY (ROUTINE TESTING W REFLEX): HIV Screen 4th Generation wRfx: NONREACTIVE

## 2019-11-07 LAB — ABO/RH: ABO/RH(D): O POS

## 2019-11-07 LAB — TRIGLYCERIDES: Triglycerides: 97 mg/dL (ref <150)

## 2019-11-07 LAB — PROCALCITONIN: Procalcitonin: <0.1 ng/mL

## 2019-11-07 LAB — C-REACTIVE PROTEIN: CRP: 0.9 mg/dL (ref <1.0)

## 2019-11-07 LAB — D-DIMER, QUANTITATIVE: D-Dimer, Quant: 0.72 ug/mL-FEU — ABNORMAL HIGH (ref 0.00–0.50)

## 2019-11-07 LAB — LACTIC ACID, PLASMA: Lactic Acid, Venous: 2 mmol/L (ref 0.5–1.9)

## 2019-11-07 LAB — FERRITIN: Ferritin: 31 ng/mL (ref 11–307)

## 2019-11-07 LAB — FIBRINOGEN: Fibrinogen: 435 mg/dL (ref 210–475)

## 2019-11-07 LAB — LACTATE DEHYDROGENASE: LDH: 185 U/L (ref 98–192)

## 2019-11-07 MED ORDER — MEMANTINE HCL 10 MG PO TABS
10.0000 mg | ORAL_TABLET | Freq: Two times a day (BID) | ORAL | Status: DC
  Administered 2019-11-07 – 2019-11-11 (×8): 10 mg via ORAL
  Filled 2019-11-07 (×11): qty 1

## 2019-11-07 MED ORDER — SODIUM CHLORIDE 0.9 % IV SOLN: 200.0000 mg | Freq: Once | INTRAVENOUS | Status: DC

## 2019-11-07 MED ORDER — SODIUM CHLORIDE 0.9 % IV SOLN
100.0000 mg | Freq: Every day | INTRAVENOUS | Status: AC
  Administered 2019-11-07 – 2019-11-10 (×4): 100 mg via INTRAVENOUS
  Filled 2019-11-07 (×4): qty 20

## 2019-11-07 MED ORDER — ZOLPIDEM TARTRATE 5 MG PO TABS
5.0000 mg | ORAL_TABLET | Freq: Every evening | ORAL | Status: DC | PRN
  Administered 2019-11-07 – 2019-11-10 (×2): 5 mg via ORAL
  Filled 2019-11-07 (×2): qty 1

## 2019-11-07 MED ORDER — LACTATED RINGERS IV SOLN: Freq: Once | INTRAVENOUS | Status: AC

## 2019-11-07 MED ORDER — POTASSIUM CHLORIDE CRYS ER 20 MEQ PO TBCR
40.0000 meq | EXTENDED_RELEASE_TABLET | Freq: Once | ORAL | Status: AC
  Administered 2019-11-07: 40 meq via ORAL
  Filled 2019-11-07: qty 2

## 2019-11-07 MED ORDER — ENOXAPARIN SODIUM 40 MG/0.4ML ~~LOC~~ SOLN
40.0000 mg | SUBCUTANEOUS | Status: DC
  Administered 2019-11-07 – 2019-11-11 (×5): 40 mg via SUBCUTANEOUS
  Filled 2019-11-07 (×5): qty 0.4

## 2019-11-07 MED ORDER — GABAPENTIN 300 MG PO CAPS
300.0000 mg | ORAL_CAPSULE | Freq: Every day | ORAL | Status: DC
  Administered 2019-11-08 – 2019-11-10 (×3): 300 mg via ORAL
  Filled 2019-11-07 (×4): qty 1

## 2019-11-07 MED ORDER — BUTALBITAL-APAP-CAFFEINE 50-325-40 MG PO TABS
1.0000 | ORAL_TABLET | Freq: Four times a day (QID) | ORAL | Status: DC | PRN
  Administered 2019-11-07 – 2019-11-08 (×4): 1 via ORAL
  Filled 2019-11-07: qty 1
  Filled 2019-11-07: qty 2
  Filled 2019-11-07: qty 1

## 2019-11-07 MED ORDER — BUTALBITAL-APAP-CAFFEINE 50-325-40 MG PO TABS: 1.0000 | ORAL_TABLET | Freq: Four times a day (QID) | ORAL | Status: DC | PRN

## 2019-11-07 MED ORDER — ATORVASTATIN CALCIUM 40 MG PO TABS
40.0000 mg | ORAL_TABLET | Freq: Every day | ORAL | Status: DC
  Administered 2019-11-07 – 2019-11-11 (×5): 40 mg via ORAL
  Filled 2019-11-07 (×5): qty 1

## 2019-11-07 MED ORDER — MELATONIN 5 MG PO TABS
5.0000 mg | ORAL_TABLET | Freq: Every day | ORAL | Status: DC
  Administered 2019-11-08 – 2019-11-10 (×3): 5 mg via ORAL
  Filled 2019-11-07 (×5): qty 1

## 2019-11-07 MED ORDER — PANTOPRAZOLE SODIUM 40 MG PO TBEC
40.0000 mg | DELAYED_RELEASE_TABLET | Freq: Two times a day (BID) | ORAL | Status: DC
  Administered 2019-11-07 – 2019-11-11 (×8): 40 mg via ORAL
  Filled 2019-11-07 (×9): qty 1

## 2019-11-07 MED ORDER — RISAQUAD PO CAPS
ORAL_CAPSULE | Freq: Every day | ORAL | Status: DC
  Administered 2019-11-07 – 2019-11-10 (×4): 1 via ORAL
  Filled 2019-11-07 (×5): qty 1

## 2019-11-07 MED ORDER — PRIMIDONE 50 MG PO TABS
50.0000 mg | ORAL_TABLET | Freq: Every day | ORAL | Status: DC
  Administered 2019-11-08 – 2019-11-10 (×3): 50 mg via ORAL
  Filled 2019-11-07 (×6): qty 1

## 2019-11-07 MED ORDER — LOSARTAN POTASSIUM 25 MG PO TABS
50.0000 mg | ORAL_TABLET | Freq: Every day | ORAL | Status: DC
  Administered 2019-11-07 – 2019-11-11 (×5): 50 mg via ORAL
  Filled 2019-11-07 (×4): qty 2
  Filled 2019-11-07: qty 1

## 2019-11-07 MED ORDER — DONEPEZIL HCL 10 MG PO TABS
10.0000 mg | ORAL_TABLET | Freq: Every day | ORAL | Status: DC
  Administered 2019-11-08 – 2019-11-10 (×3): 10 mg via ORAL
  Filled 2019-11-07 (×4): qty 1

## 2019-11-07 MED ORDER — ONDANSETRON HCL 4 MG/2ML IJ SOLN
4.0000 mg | Freq: Four times a day (QID) | INTRAMUSCULAR | Status: DC | PRN
  Administered 2019-11-07 – 2019-11-09 (×6): 4 mg via INTRAVENOUS
  Filled 2019-11-07 (×6): qty 2

## 2019-11-07 MED ORDER — SODIUM CHLORIDE 0.9 % IV SOLN: 100.0000 mg | Freq: Every day | INTRAVENOUS | Status: DC

## 2019-11-07 MED ORDER — ACETAMINOPHEN 325 MG PO TABS
650.0000 mg | ORAL_TABLET | Freq: Four times a day (QID) | ORAL | Status: DC | PRN
  Administered 2019-11-07 – 2019-11-10 (×6): 650 mg via ORAL
  Filled 2019-11-07 (×6): qty 2

## 2019-11-07 MED ORDER — METOPROLOL TARTRATE 25 MG PO TABS
25.0000 mg | ORAL_TABLET | Freq: Two times a day (BID) | ORAL | Status: DC
  Administered 2019-11-07 – 2019-11-11 (×8): 25 mg via ORAL
  Filled 2019-11-07 (×9): qty 1

## 2019-11-07 MED ORDER — POTASSIUM CHLORIDE 20 MEQ PO PACK
40.0000 meq | PACK | Freq: Once | ORAL | Status: AC
  Administered 2019-11-07: 40 meq via ORAL
  Filled 2019-11-07: qty 2

## 2019-11-07 MED ORDER — VENLAFAXINE HCL ER 75 MG PO CP24
75.0000 mg | ORAL_CAPSULE | Freq: Every day | ORAL | Status: DC
  Administered 2019-11-07 – 2019-11-11 (×5): 75 mg via ORAL
  Filled 2019-11-07 (×7): qty 1

## 2019-11-07 MED ORDER — SODIUM CHLORIDE 0.9 % IV SOLN
200.0000 mg | Freq: Once | INTRAVENOUS | Status: AC
  Administered 2019-11-07: 200 mg via INTRAVENOUS
  Filled 2019-11-07: qty 200

## 2019-11-07 MED ORDER — FLUTICASONE PROPIONATE 50 MCG/ACT NA SUSP: 2.0000 | Freq: Every day | NASAL | Status: DC | PRN

## 2019-11-07 NOTE — ED Notes (Signed)
Fair, MD made aware of Pt lactic Acid of 2.0

## 2019-11-07 NOTE — Plan of Care (Signed)

## 2019-11-07 NOTE — ED Notes (Signed)
[facility] called for transport. 

## 2019-11-07 NOTE — Progress Notes (Signed)
PROGRESS NOTE    Mia [name]  [id number]  DOB: 09/10/1951  PCP: Hali Marry, MD Admit date:11/06/2019  69 y.o. female with medical history significant for pre-diabetes, primary lateral sclerosis, HTN, rheumatoid arthritis, hypothyroidism who was recently diagnosed with Covid 19 on 1/9 presents to the ED with complaints of diarrhea, vomiting, inability to tolerate p.o. intake and fatigue since diagnosed with Covid.  She also reports feeling wobbly on walking. ED Course: Afebrile, generalized weakness, unable to ambulate independently due to dizziness and tachycardia up to 120s-140s with standing next to the bed. Labs significant for K of 2.6. Slight prolonged QTc on ECG. CXR without acute cardio-plumonary process Received K orally and IV in the ED.  Hospital course: Patient admitted to [facility] fordehydration, electrolyte derangement and weakness/fatigue in setting of COVID positive status. Inflammatory markers ordered.  Started on remdesivir.  Not on steroids as patient not hypoxic, no infiltrate on chest x-ray and apparently is prediabetic.  Subjective:  Patient seen in ED room. Awaiting transport to [place]. Still nauseous, several bouts of vomiting. Wanted medicine for headache, ordered Fioricet but not tolerating po meds.   Objective: Vitals:   11/07/19 0450 11/07/19 0641 11/07/19 0712 11/07/19 0821  BP: (!) 150/60 (!) 156/73  (!) 178/91  Pulse: 86 89 85 90  Resp: 12 (!) 22 19 19   Temp:      TempSrc:      SpO2: 97% 97% 98% 96%  Weight:      Height:        Intake/Output Summary (Last 24 hours) at 11/07/2019 0840 Last data filed at 11/07/2019 [id number] Gross per 24 hour  Intake 2350 ml  Output -  Net 2350 ml   Filed Weights   11/06/19 1607  Weight: 81.2 kg    Physical Examination:  General exam: Appears calm and comfortable  Respiratory system: Clear to auscultation. Respiratory effort normal. Cardiovascular system: S1 & S2 heard, RRR. No JVD, murmurs, rubs, gallops or  clicks. No pedal edema. Gastrointestinal system: Abdomen is nondistended, soft and mild diffuse tenderness. Normal bowel sounds heard. Central nervous system: Alert and oriented. No new focal neurological deficits. Extremities: No contractures, edema or joint deformities.  Skin: No rashes, lesions or ulcers Psychiatry: Judgement and insight appear normal. Mood & affect appropriate.   Data Reviewed: I have personally reviewed following labs and imaging studies  CBC: Recent Labs  Lab 11/06/19 1617 11/07/19 0004 11/07/19 0756  WBC 5.1 5.6 5.5  NEUTROABS  --  3.9  --   HGB 12.9 11.1* 10.9*  HCT 40.6 36.1 34.9*  MCV 93.8 96.0 94.8  PLT 298 267 [id number]   Basic Metabolic Panel: Recent Labs  Lab 11/06/19 1617 11/06/19 2148 11/07/19 0245  NA 139  --   --   K 2.6*  --   --   CL 100  --   --   CO2 25  --   --   GLUCOSE 111*  --   --   BUN <5*  --   --   CREATININE 0.66  --  0.70  CALCIUM 9.0  --   --   MG  --  2.1  --    GFR: Estimated Creatinine Clearance: 72.4 mL/min (by C-G formula based on SCr of 0.7 mg/dL). Liver Function Tests: Recent Labs  Lab 11/07/19 0004  AST 30  ALT 27  ALKPHOS 59  BILITOT 0.5  PROT 6.2*  ALBUMIN 3.5   No results for input(s): LIPASE, AMYLASE in the last 168  hours. No results for input(s): AMMONIA in the last 168 hours. Coagulation Profile: No results for input(s): INR, PROTIME in the last 168 hours. Cardiac Enzymes: No results for input(s): CKTOTAL, CKMB, CKMBINDEX, TROPONINI in the last 168 hours. BNP (last 3 results) No results for input(s): PROBNP in the last 8760 hours. HbA1C: No results for input(s): HGBA1C in the last 72 hours. CBG: No results for input(s): GLUCAP in the last 168 hours. Lipid Profile: Recent Labs    11/07/19 0004  TRIG 97   Thyroid Function Tests: No results for input(s): TSH, T4TOTAL, FREET4, T3FREE, THYROIDAB in the last 72 hours. Anemia Panel: Recent Labs    11/07/19 0004  FERRITIN 31   Sepsis Labs:  Recent Labs  Lab 11/07/19 0004  PROCALCITON <0.10  LATICACIDVEN 2.0*    No results found for this or any previous visit (from the past 240 hour(s)).    Radiology Studies: DG Chest Portable 1 View  Result Date: 11/06/2019 CLINICAL DATA:  COVID.  Difficulty breathing. EXAM: PORTABLE CHEST 1 VIEW COMPARISON:  07/05/2017 FINDINGS: Heart and mediastinal contours are within normal limits. No focal opacities or effusions. No acute bony abnormality. IMPRESSION: Negative. Electronically Signed   By: Rolm Baptise M.D.   On: 11/06/2019 17:09        Scheduled Meds: . enoxaparin (LOVENOX) injection  40 mg Subcutaneous Q24H  . losartan  50 mg Oral Daily  . Melatonin  5 mg Oral QHS   Continuous Infusions: . remdesivir 100 mg in NS 100 mL      Assessment & Plan:   1.  Acute COVID-19 illness with gastroenteritis: Started on IV remdesivir.  Lactate at 2.0, ferritin 31, CRP 0.9, pro calcitonin less than 0.1, D-dimer 0.72.  Supportive management for now with IV fluids and slow advancement in diet.  2.  Hypertension:  Blood pressure elevated this morning.Resumed losartan on admission, resume metoprolol and monitor.   3.  Significant hypokalemia: Received p.o./IV replacement in ED.  Repeat labs this morning show potassium at 3.4.  Will start Potassium supplemented IV fluids  4.  Prediabetes: Hold Metformin for now.   5.  Rheumatoid arthritis: On Arava daily and Prolia subcu shots every 6 months.  6.  Primary lateral sclerosis: Resume home medications when able to tolerate p.o.  7.  Essential tremors: Resume primidone  8.  Mild cognitive impairment: Resume Aricept/Namenda  9.  Hyperlipidemia: Resume statins  10.  Obstructive sleep apnea: Will hold off on CPAP given problem #1   DVT prophylaxis: Lovenox Code Status: Full code Family / Patient Communication: Discussed with patient Disposition Plan: Home when able to tolerate p.o. Moving this patient to inpatient status as she has  failed observation. She is unable to tolerate PO/meds and needs to remain on IVF/IV meds     LOS: 0 days    Time spent: 35 minutes    Guilford Shi, MD [facility] Pager [phone]  If 7PM-7AM, please contact night-coverage [email or web address] Password [email or web address] 11/07/2019, 8:40 AM

## 2019-11-07 NOTE — ED Notes (Signed)
Pt has positive Covid test results at bedside.

## 2019-11-07 NOTE — ED Notes (Signed)
Pt assisted to bedpan. Pt provided with gingerale

## 2019-11-08 DIAGNOSIS — R7303 Prediabetes: Secondary | ICD-10-CM

## 2019-11-08 DIAGNOSIS — I10 Essential (primary) hypertension: Secondary | ICD-10-CM

## 2019-11-08 DIAGNOSIS — K529 Noninfective gastroenteritis and colitis, unspecified: Secondary | ICD-10-CM

## 2019-11-08 LAB — COMPREHENSIVE METABOLIC PANEL
ALT: 22 U/L (ref 0–44)
AST: 29 U/L (ref 15–41)
Albumin: 3.1 g/dL — ABNORMAL LOW (ref 3.5–5.0)
Alkaline Phosphatase: 51 U/L (ref 38–126)
Anion gap: 10 (ref 5–15)
BUN: 5 mg/dL — ABNORMAL LOW (ref 8–23)
CO2: 21 mmol/L — ABNORMAL LOW (ref 22–32)
Calcium: 8 mg/dL — ABNORMAL LOW (ref 8.9–10.3)
Chloride: 106 mmol/L (ref 98–111)
Creatinine, Ser: 0.64 mg/dL (ref 0.44–1.00)
GFR calc Af Amer: >60 mL/min (ref >60)
GFR calc non Af Amer: >60 mL/min (ref >60)
Glucose, Bld: 78 mg/dL (ref 70–99)
Potassium: 3.7 mmol/L (ref 3.5–5.1)
Sodium: 137 mmol/L (ref 135–145)
Total Bilirubin: 0.9 mg/dL (ref 0.3–1.2)
Total Protein: 6 g/dL — ABNORMAL LOW (ref 6.5–8.1)

## 2019-11-08 LAB — CBC
HCT: 34.4 % — ABNORMAL LOW (ref 36.0–46.0)
Hemoglobin: 11 g/dL — ABNORMAL LOW (ref 12.0–15.0)
MCH: 30 pg (ref 26.0–34.0)
MCHC: 32 g/dL (ref 30.0–36.0)
MCV: 93.7 fL (ref 80.0–100.0)
Platelets: 252 10*3/uL (ref 150–400)
RBC: 3.67 MIL/uL — ABNORMAL LOW (ref 3.87–5.11)
RDW: 14.6 % (ref 11.5–15.5)
WBC: 4.7 10*3/uL (ref 4.0–10.5)
nRBC: 0 % (ref 0.0–0.2)

## 2019-11-08 LAB — C-REACTIVE PROTEIN: CRP: 2.5 mg/dL — ABNORMAL HIGH (ref <1.0)

## 2019-11-08 LAB — D-DIMER, QUANTITATIVE: D-Dimer, Quant: 0.64 ug/mL-FEU — ABNORMAL HIGH (ref 0.00–0.50)

## 2019-11-08 MED ORDER — PROMETHAZINE HCL 25 MG/ML IJ SOLN
12.5000 mg | Freq: Four times a day (QID) | INTRAMUSCULAR | Status: DC | PRN
  Administered 2019-11-08 – 2019-11-11 (×7): 12.5 mg via INTRAVENOUS
  Filled 2019-11-08 (×7): qty 1

## 2019-11-08 MED ORDER — SODIUM CHLORIDE 0.9 % IV SOLN: INTRAVENOUS | Status: DC

## 2019-11-08 NOTE — Plan of Care (Signed)

## 2019-11-08 NOTE — Progress Notes (Signed)
PROGRESS NOTE    Mia [name]  [id number]  DOB: Jan 13, 1951  PCP: Hali Marry, MD Admit date:11/06/2019  69 y.o. female with medical history significant for pre-diabetes, primary lateral sclerosis, HTN, rheumatoid arthritis, hypothyroidism who was recently diagnosed with Covid 19 on 1/9 presents to the ED with complaints of diarrhea, vomiting, inability to tolerate p.o. intake and fatigue since diagnosed with Covid.  She also reports feeling wobbly on walking. In ED patient found to be afebrile, with generalized weakness, unable to ambulate independently due to dizziness and tachycardia up to 120s-140s with standing next to the bed. Labs significant for K of 2.6. Slight prolonged QTc on ECG. CXR without acute cardio-plumonary process Received K orally and IV in the ED.  Patient admitted to [facility] service for dehydration, electrolyte derangement and weakness/fatigue in setting of COVID positive status. Inflammatory markers ordered.  Started on remdesivir.  Not on steroids as patient not hypoxic, no infiltrate on chest x-ray and apparently is prediabetic.  Subjective: No acute issues or events overnight.  Patient continues to complain of nausea and vomiting well as frontotemporal headache which are poorly controlled with current medications.  Denies fever, chills, chest pain, shortness of breath.  Assessment & Plan: Active Problems:   Hypertension   Hypokalemia   COVID-19 virus infection   Dehydration   Prediabetes   Gastroenteritis   Acute COVID-19 illness with gastroenteritis, POA: Concurrent intractable nausea vomiting diarrhea Recent Labs    11/07/19 0004 11/08/19 0147  DDIMER 0.72* 0.64*  FERRITIN 31  --   LDH 185  --   CRP 0.9 2.5*  Started on IV remdesivir 1/11-1/15. Continue supportive care, IV fluids, advance diet slowly - downgraded to full liquid diet 13th  Hypertension:   Continue home losartan and metoprolol  Significant hypokalemia, resolved: Likely in  the setting of above intractable diarrhea with poor p.o. intake Continue to follow morning labs, previously repleted, now within normal limits  Prediabetes:  Hold Metformin for now in the setting of dehydration and poor p.o. intake  Rheumatoid arthritis: On Arava daily and Prolia subcu shots every 6 months.  Primary lateral sclerosis: Resume home medications when able to tolerate p.o.  Essential tremors: Resume primidone  Mild cognitive impairment: Resume Aricept/Namenda  Hyperlipidemia: Resume statins  Obstructive sleep apnea: Will hold off on CPAP given problem #1   DVT prophylaxis: Lovenox Code Status: Full code Family / Patient Communication: Discussed with patient Disposition Plan: Home when able to tolerate p.o. likely in the next 24 to 48 hours.  Patient continues as inpatient given a markedly poor p.o. intake, dehydration, need for IV fluids and IV medications to control her symptoms as she cannot tolerate p.o safely  Objective: Vitals:   11/08/19 0700 11/08/19 0858 11/08/19 1215 11/08/19 1551  BP: (!) 152/56 (!) 152/56 128/60 131/64  Pulse: 74 (!) 109 65 79  Resp: 16     Temp: 98.1 F (36.7 C)  98.6 F (37 C) 99 F (37.2 C)  TempSrc: Oral  Oral Oral  SpO2: 94%  94% 97%  Weight:      Height:        Intake/Output Summary (Last 24 hours) at 11/08/2019 1607 Last data filed at 11/08/2019 1245 Gross per 24 hour  Intake 680 ml  Output -  Net 680 ml   Filed Weights   11/06/19 1607 11/07/19 1253  Weight: 81.2 kg 88.6 kg    Physical Examination:  General:  Pleasantly resting in bed, No acute distress. HEENT:  Normocephalic  atraumatic.  Sclerae nonicteric, noninjected.  Extraocular movements intact bilaterally. Neck:  Without mass or deformity.  Trachea is midline. Lungs:  Clear to auscultate bilaterally without rhonchi, wheeze, or rales. Heart:  Regular rate and rhythm.  Without murmurs, rubs, or gallops. Abdomen:  Soft, nontender, nondistended.  Without  guarding or rebound. Extremities: Without cyanosis, clubbing, edema, or obvious deformity. Vascular:  Dorsalis pedis and posterior tibial pulses palpable bilaterally. Skin:  Warm and dry, no erythema, no ulcerations.  Data Reviewed: I have personally reviewed following labs and imaging studies  CBC: Recent Labs  Lab 11/06/19 1617 11/07/19 0004 11/07/19 0756 11/08/19 0147  WBC 5.1 5.6 5.5 4.7  NEUTROABS  --  3.9  --   --   HGB 12.9 11.1* 10.9* 11.0*  HCT 40.6 36.1 34.9* 34.4*  MCV 93.8 96.0 94.8 93.7  PLT 298 267 252 [id number]   Basic Metabolic Panel: Recent Labs  Lab 11/06/19 1617 11/06/19 2148 11/07/19 0245 11/07/19 0756 11/08/19 0147  NA 139  --   --  139 137  K 2.6*  --   --  3.4* 3.7  CL 100  --   --  103 106  CO2 25  --   --  25 21*  GLUCOSE 111*  --   --  115* 78  BUN <5*  --   --  <5* 5*  CREATININE 0.66  --  0.70 0.67 0.64  CALCIUM 9.0  --   --  8.3* 8.0*  MG  --  2.1  --   --   --    GFR: Estimated Creatinine Clearance: 75.4 mL/min (by C-G formula based on SCr of 0.64 mg/dL).   Liver Function Tests: Recent Labs  Lab 11/07/19 0004 11/07/19 0756 11/08/19 0147  AST 30 32 29  ALT 27 25 22   ALKPHOS 59 59 51  BILITOT 0.5 0.2* 0.9  PROT 6.2* 6.1* 6.0*  ALBUMIN 3.5 3.3* 3.1*   Lipid Profile: Recent Labs    11/07/19 0004  TRIG 97   Anemia Panel: Recent Labs    11/07/19 0004  FERRITIN 31   Sepsis Labs: Recent Labs  Lab 11/07/19 0004  PROCALCITON <0.10  LATICACIDVEN 2.0*    No results found for this or any previous visit (from the past 240 hour(s)).    Radiology Studies: DG Chest Portable 1 View  Result Date: 11/06/2019 CLINICAL DATA:  COVID.  Difficulty breathing. EXAM: PORTABLE CHEST 1 VIEW COMPARISON:  07/05/2017 FINDINGS: Heart and mediastinal contours are within normal limits. No focal opacities or effusions. No acute bony abnormality. IMPRESSION: Negative. Electronically Signed   By: Rolm Baptise M.D.   On: 11/06/2019 17:09     Scheduled Meds: . acidophilus   Oral Daily  . atorvastatin  40 mg Oral Daily  . donepezil  10 mg Oral QHS  . enoxaparin (LOVENOX) injection  40 mg Subcutaneous Q24H  . gabapentin  300 mg Oral QHS  . losartan  50 mg Oral Daily  . Melatonin  5 mg Oral QHS  . memantine  10 mg Oral BID  . metoprolol tartrate  25 mg Oral BID  . pantoprazole  40 mg Oral BID  . primidone  50 mg Oral QHS  . venlafaxine XR  75 mg Oral Q breakfast   Continuous Infusions: . sodium chloride 70 mL/hr at 11/08/19 0958  . remdesivir 100 mg in NS 100 mL 100 mg (11/08/19 0925)     LOS: 1 day    Time spent: 35 minutes  Little Ishikawa, DO [facility] Pager [phone]  If 7PM-7AM, please contact night-coverage [email or web address] Password [facility] 11/08/2019, 4:07 PM

## 2019-11-09 ENCOUNTER — Ambulatory Visit: Payer: Medicare Other

## 2019-11-09 LAB — COMPREHENSIVE METABOLIC PANEL
ALT: 20 U/L (ref 0–44)
AST: 25 U/L (ref 15–41)
Albumin: 2.9 g/dL — ABNORMAL LOW (ref 3.5–5.0)
Alkaline Phosphatase: 51 U/L (ref 38–126)
Anion gap: 8 (ref 5–15)
BUN: 7 mg/dL — ABNORMAL LOW (ref 8–23)
CO2: 26 mmol/L (ref 22–32)
Calcium: 8 mg/dL — ABNORMAL LOW (ref 8.9–10.3)
Chloride: 105 mmol/L (ref 98–111)
Creatinine, Ser: 0.62 mg/dL (ref 0.44–1.00)
GFR calc Af Amer: >60 mL/min (ref >60)
GFR calc non Af Amer: >60 mL/min (ref >60)
Glucose, Bld: 94 mg/dL (ref 70–99)
Potassium: 2.8 mmol/L — ABNORMAL LOW (ref 3.5–5.1)
Sodium: 139 mmol/L (ref 135–145)
Total Bilirubin: 0.2 mg/dL — ABNORMAL LOW (ref 0.3–1.2)
Total Protein: 5.5 g/dL — ABNORMAL LOW (ref 6.5–8.1)

## 2019-11-09 LAB — CBC
HCT: 34.1 % — ABNORMAL LOW (ref 36.0–46.0)
Hemoglobin: 10.8 g/dL — ABNORMAL LOW (ref 12.0–15.0)
MCH: 29.3 pg (ref 26.0–34.0)
MCHC: 31.7 g/dL (ref 30.0–36.0)
MCV: 92.7 fL (ref 80.0–100.0)
Platelets: 263 10*3/uL (ref 150–400)
RBC: 3.68 MIL/uL — ABNORMAL LOW (ref 3.87–5.11)
RDW: 14.6 % (ref 11.5–15.5)
WBC: 4.4 10*3/uL (ref 4.0–10.5)
nRBC: 0 % (ref 0.0–0.2)

## 2019-11-09 LAB — C-REACTIVE PROTEIN: CRP: 2.7 mg/dL — ABNORMAL HIGH (ref <1.0)

## 2019-11-09 LAB — D-DIMER, QUANTITATIVE: D-Dimer, Quant: 0.52 ug/mL-FEU — ABNORMAL HIGH (ref 0.00–0.50)

## 2019-11-09 MED ORDER — SODIUM CHLORIDE 0.9% FLUSH
10.0000 mL | Freq: Two times a day (BID) | INTRAVENOUS | Status: DC
  Administered 2019-11-10 – 2019-11-11 (×3): 10 mL

## 2019-11-09 MED ORDER — SODIUM CHLORIDE 0.9% FLUSH: 10.0000 mL | INTRAVENOUS | Status: DC | PRN

## 2019-11-09 MED ORDER — POTASSIUM CHLORIDE CRYS ER 20 MEQ PO TBCR
40.0000 meq | EXTENDED_RELEASE_TABLET | ORAL | Status: AC
  Administered 2019-11-09 (×2): 40 meq via ORAL
  Filled 2019-11-09 (×2): qty 2

## 2019-11-09 NOTE — Discharge Summary (Signed)
Physician Discharge Summary  Mia [name] [id number] DOB: 13-May-1951 DOA: 11/06/2019  PCP: Hali Marry, MD  Admit date: 11/06/2019 Discharge date: 11/11/2019  Admitted From: Home Disposition: Home  Recommendations for Outpatient Follow-up:  1. Follow up with PCP in 1-2 weeks 2. Please obtain BMP/CBC in one week  Discharge Condition: Stable CODE STATUS: Full Diet recommendation: Soft bland diet as tolerated  Brief/Interim Summary: 69 y.o.femalewith medical history significant for pre-diabetes, primary lateral sclerosis, HTN, rheumatoid arthritis, hypothyroidism who was recently diagnosed with Covid 19 on 1/9 presents to the ED with complaints of diarrhea, vomiting, inability to tolerate p.o. intake and fatigue since diagnosed with Covid.  She also reports feeling wobbly on walking. In ED patient found to be afebrile, with generalized weakness, unable to ambulate independently due to dizziness and tachycardia up to 120s-140s with standing next to the bed. Labs significant for K of 2.6. Slight prolonged QTc on ECG. CXR without acute cardio-plumonary process Received K orally and IV in the ED.  Patient admitted to [facility] service for dehydration, electrolyte derangement and weakness/fatigue in setting of COVID positive status. Inflammatory markers ordered.  Started on remdesivir.  Not on steroids as patient not hypoxic, no infiltrate on chest x-ray and apparently is prediabetic.  Patient admitted with acute intractable nausea vomiting diarrhea in the setting of COVID-19 illness.  Patient also had notable hypokalemia in the setting of diarrhea but has now resolved.  P.o. intake continues to improve daily, she has had 1 small episode of loose to watery stool over the past 24 hours, now able to maintain hydration levels and keep p.o. down without incident.  Patient will need close follow-up with PCP in the next 1 to 2 weeks for repeat labs most notably BMP to follow potassium.  Patient has  worsening abdominal pain discomfort worsening diarrhea or inability to take p.o due to nausea vomiting uncontrolled by medications she should return back to the hospital for further evaluation and treatment..  Discharge Diagnoses:  Active Problems:   Hypertension   Hypokalemia   COVID-19 virus infection   Dehydration   Prediabetes   Gastroenteritis  Discharge Instructions  Discharge Instructions    Call MD for:  difficulty breathing, headache or visual disturbances   Complete by: As directed    Call MD for:  difficulty breathing, headache or visual disturbances   Complete by: As directed    Call MD for:  extreme fatigue   Complete by: As directed    Call MD for:  extreme fatigue   Complete by: As directed    Call MD for:  hives   Complete by: As directed    Call MD for:  hives   Complete by: As directed    Call MD for:  persistant dizziness or light-headedness   Complete by: As directed    Call MD for:  persistant dizziness or light-headedness   Complete by: As directed    Call MD for:  persistant nausea and vomiting   Complete by: As directed    Call MD for:  persistant nausea and vomiting   Complete by: As directed    Call MD for:  severe uncontrolled pain   Complete by: As directed    Call MD for:  severe uncontrolled pain   Complete by: As directed    Call MD for:  temperature >100.4   Complete by: As directed    Call MD for:  temperature >100.4   Complete by: As directed    Diet -  low sodium heart healthy   Complete by: As directed    Increase activity slowly   Complete by: As directed    Increase activity slowly   Complete by: As directed      Allergies as of 11/11/2019      Reactions   Codeine Nausea And Vomiting   Myrbetriq  [mirabegron] Other (See Comments)   SEVERE HEADACHE   Percocet [oxycodone-acetaminophen] Nausea And Vomiting   Celebrex [celecoxib] Other (See Comments)   Other reaction(s): Other flushed   Darvocet [propoxyphene N-acetaminophen]  Nausea And Vomiting   Erythromycin Nausea And Vomiting   Nausea and vomiting   Hydrocodone Nausea And Vomiting   Lyrica [pregabalin] Swelling   Legs swelling   Macrodantin [nitrofurantoin] Nausea And Vomiting   Toradol [ketorolac Tromethamine] Nausea And Vomiting   Per patient, only PO form causes nausea and vomiting. Can take injection without issue   Tramadol Nausea And Vomiting   Verapamil Nausea And Vomiting   REACTION: intolerance      Medication List    STOP taking these medications   ibuprofen 800 MG tablet Commonly known as: ADVIL   promethazine-dextromethorphan 6.25-15 MG/5ML syrup Commonly known as: PROMETHAZINE-DM     TAKE these medications   AMBULATORY NON FORMULARY MEDICATION Medication Name: One touch ultra strips. Check fasting blood sugar in the morning and as needed.  Dx - [id number] Fax to [phone]   atorvastatin 40 MG tablet Commonly known as: LIPITOR TAKE 1 TABLET EVERY DAY What changed: when to take this   denosumab 60 MG/ML Soln injection Commonly known as: PROLIA Inject 60 mg into the skin every 6 (six) months. Administer in upper arm, thigh, or abdomen   donepezil 10 MG tablet Commonly known as: ARICEPT TAKE 1 TABLET BY MOUTH AT BEDTIME   fluticasone 50 MCG/ACT nasal spray Commonly known as: FLONASE Place 2 sprays into both nostrils daily. What changed:   when to take this  reasons to take this   furosemide 20 MG tablet Commonly known as: LASIX Take 1 tablet (20 mg total) by mouth daily. Please make yearly appt with Dr. Acie Fredrickson for May before anymore refills. 1st attempt Start taking on: November 15, 2019 What changed: These instructions start on November 15, 2019. If you are unsure what to do until then, ask your doctor or other care provider.   gabapentin 300 MG capsule Commonly known as: NEURONTIN TAKE 1 TO 2 CAPSULES BY MOUTH UP TO TWO TIMES DAILY AS NEEDED What changed: See the new instructions.   leflunomide 20 MG  tablet Commonly known as: ARAVA TAKE 1 TABLET EVERY DAY   losartan 50 MG tablet Commonly known as: COZAAR TAKE 1 TABLET EVERY DAY   Melatonin 5 MG Tabs Take 5 mg by mouth at bedtime.   memantine 10 MG tablet Commonly known as: NAMENDA TAKE 1 TABLET TWICE DAILY   metFORMIN 500 MG 24 hr tablet Commonly known as: GLUCOPHAGE-XR TAKE 2 TABLETS EVERY DAY WITH BREAKFAST What changed: See the new instructions.   metoprolol tartrate 100 MG tablet Commonly known as: LOPRESSOR TAKE 1 AND 1/2 TABLETS TWICE DAILY What changed: See the new instructions.   MiraLax 17 GM/SCOOP powder Generic drug: polyethylene glycol powder Take 17 g by mouth daily as needed for mild constipation or moderate constipation.   nitroGLYCERIN 2 % Oint ointment Commonly known as: NITROGLYN Apply 1 application topically as needed. Apply topically as needed for symptomatic relief.   NON FORMULARY Place into the nose at bedtime. cpap machine  ondansetron 4 MG tablet Commonly known as: Zofran Take 1 tablet (4 mg total) by mouth daily as needed for nausea or vomiting. What changed:   how much to take  how to take this  when to take this  reasons to take this  additional instructions   ONE TOUCH ULTRA TEST test strip Generic drug: glucose blood USE TO TEST FASTING BLOOD SUGAR IN THE MORNING AND AS NEEDED What changed: See the new instructions.   pantoprazole 40 MG tablet Commonly known as: PROTONIX Take 40 mg by mouth 2 (two) times daily.   primidone 50 MG tablet Commonly known as: MYSOLINE TAKE 1 TABLET AT BEDTIME   PROBIOTIC ADVANCED PO Take 1 capsule by mouth daily.   promethazine 25 MG suppository Commonly known as: Phenergan Place 1 suppository (25 mg total) rectally every 6 (six) hours as needed for nausea.   venlafaxine XR 75 MG 24 hr capsule Commonly known as: EFFEXOR-XR TAKE 1 CAPSULE EVERY DAY WITH BREAKFAST What changed:   how much to take  how to take this  when to  take this  additional instructions   VITAMIN B-12 IJ Inject 1 mL as directed every 30 (thirty) days.       Allergies  Allergen Reactions  . Codeine Nausea And Vomiting  . Myrbetriq  [Mirabegron] Other (See Comments)    SEVERE HEADACHE  . Percocet [Oxycodone-Acetaminophen] Nausea And Vomiting  . Celebrex [Celecoxib] Other (See Comments)    Other reaction(s): Other flushed  . Darvocet [Propoxyphene N-Acetaminophen] Nausea And Vomiting  . Erythromycin Nausea And Vomiting    Nausea and vomiting   . Hydrocodone Nausea And Vomiting  . Lyrica [Pregabalin] Swelling    Legs swelling   . Macrodantin [Nitrofurantoin] Nausea And Vomiting  . Toradol [Ketorolac Tromethamine] Nausea And Vomiting    Per patient, only PO form causes nausea and vomiting. Can take injection without issue  . Tramadol Nausea And Vomiting  . Verapamil Nausea And Vomiting    REACTION: intolerance    Procedures/Studies: DG Chest Portable 1 View  Result Date: 11/06/2019 CLINICAL DATA:  COVID.  Difficulty breathing. EXAM: PORTABLE CHEST 1 VIEW COMPARISON:  07/05/2017 FINDINGS: Heart and mediastinal contours are within normal limits. No focal opacities or effusions. No acute bony abnormality. IMPRESSION: Negative. Electronically Signed   By: Rolm Baptise M.D.   On: 11/06/2019 17:09    Subjective: No acute issues or events overnight, single episode of small volume diarrhea but otherwise tolerating p.o. well denies ongoing vomiting, intractable abdominal pain headache fevers or chills.   Discharge Exam: Vitals:   11/11/19 0400 11/11/19 0757  BP: (!) 183/75 (!) 177/70  Pulse: 68 67  Resp: 17 17  Temp: 98.9 F (37.2 C) 98.4 F (36.9 C)  SpO2: 97% 97%   Vitals:   11/10/19 2000 11/10/19 2125 11/11/19 0400 11/11/19 0757  BP: (!) 152/74 (!) 171/75 (!) 183/75 (!) 177/70  Pulse: 73 69 68 67  Resp: 18  17 17   Temp: 98.9 F (37.2 C)  98.9 F (37.2 C) 98.4 F (36.9 C)  TempSrc: Oral  Oral Oral  SpO2: 99%   97% 97%  Weight:      Height:        General:  Pleasantly resting in bed, No acute distress. HEENT:  Normocephalic atraumatic.  Sclerae nonicteric, noninjected.  Extraocular movements intact bilaterally. Neck:  Without mass or deformity.  Trachea is midline. Lungs:  Clear to auscultate bilaterally without rhonchi, wheeze, or rales. Heart:  Regular rate and  rhythm.  Without murmurs, rubs, or gallops. Abdomen:  Soft, nontender, nondistended.  Without guarding or rebound. Extremities: Without cyanosis, clubbing, edema, or obvious deformity. Vascular:  Dorsalis pedis and posterior tibial pulses palpable bilaterally. Skin:  Warm and dry, no erythema, no ulcerations.   The results of significant diagnostics from this hospitalization (including imaging, microbiology, ancillary and laboratory) are listed below for reference.     Microbiology: No results found for this or any previous visit (from the past 240 hour(s)).   Labs:  Basic Metabolic Panel: Recent Labs  Lab 11/06/19 2148 11/07/19 0245 11/07/19 0756 11/08/19 0147 11/09/19 0123 11/10/19 0056 11/11/19 0136  NA  --   --  139 137 139 139 137  K  --   --  3.4* 3.7 2.8* 3.5 3.5  CL  --   --  103 106 105 109 106  CO2  --   --  25 21* 26 23 23   GLUCOSE  --   --  115* 78 94 91 96  BUN  --   --  <5* 5* 7* 6* 8  CREATININE  --    < > 0.67 0.64 0.62 0.58 0.60  CALCIUM  --   --  8.3* 8.0* 8.0* 7.9* 7.8*  MG 2.1  --   --   --   --   --   --    < > = values in this interval not displayed.   Liver Function Tests: Recent Labs  Lab 11/07/19 0756 11/08/19 0147 11/09/19 0123 11/10/19 0056 11/11/19 0136  AST 32 29 25 20 18   ALT 25 22 20 17 16   ALKPHOS 59 51 51 50 56  BILITOT 0.2* 0.9 0.2* 0.5 0.2*  PROT 6.1* 6.0* 5.5* 5.4* 5.3*  ALBUMIN 3.3* 3.1* 2.9* 3.0* 2.8*   No results for input(s): LIPASE, AMYLASE in the last 168 hours. No results for input(s): AMMONIA in the last 168 hours. CBC: Recent Labs  Lab 11/07/19 0004  11/07/19 0004 11/07/19 0756 11/08/19 0147 11/09/19 0123 11/10/19 0056 11/11/19 0136  WBC 5.6   < > 5.5 4.7 4.4 4.2 6.3  NEUTROABS 3.9  --   --   --   --   --   --   HGB 11.1*   < > 10.9* 11.0* 10.8* 10.7* 10.5*  HCT 36.1   < > 34.9* 34.4* 34.1* 33.2* 33.3*  MCV 96.0   < > 94.8 93.7 92.7 93.3 93.3  PLT 267   < > 252 252 263 254 279   < > = values in this interval not displayed.   CBG: Recent Labs  Lab 11/10/19 2112  GLUCAP 96   D-Dimer Recent Labs    11/10/19 0056 11/11/19 0136  DDIMER 0.66* 0.59*   Urinalysis    Component Value Date/Time   COLORURINE YELLOW 06/20/2016 1443   APPEARANCEUR CLEAR 06/20/2016 1443   LABSPEC 1.002 (L) 06/20/2016 1443   PHURINE 6.0 06/20/2016 1443   GLUCOSEU NEGATIVE 06/20/2016 1443   HGBUR NEGATIVE 06/20/2016 1443   BILIRUBINUR NEGATIVE 06/20/2016 1443   BILIRUBINUR neg 04/17/2016 1506   KETONESUR NEGATIVE 06/20/2016 1443   PROTEINUR NEGATIVE 06/20/2016 1443   UROBILINOGEN 0.2 04/17/2016 1506   UROBILINOGEN 0.2 08/30/2010 2335   NITRITE NEGATIVE 06/20/2016 1443   LEUKOCYTESUR NEGATIVE 06/20/2016 1443   Time coordinating discharge: Over 30 minutes  SIGNED:   Little Ishikawa, DO [facility] 11/11/2019, 8:19 AM Pager   If 7PM-7AM, please contact night-coverage [email or web address] Password [email or web address]

## 2019-11-09 NOTE — Progress Notes (Signed)
PROGRESS NOTE    Mia [name]  [id number]  DOB: 1951-01-10  PCP: Hali Marry, MD Admit date:11/06/2019  69 y.o. female with medical history significant for pre-diabetes, primary lateral sclerosis, HTN, rheumatoid arthritis, hypothyroidism who was recently diagnosed with Covid 19 on 1/9 presents to the ED with complaints of diarrhea, vomiting, inability to tolerate p.o. intake and fatigue since diagnosed with Covid.  She also reports feeling wobbly on walking. In ED patient found to be afebrile, with generalized weakness, unable to ambulate independently due to dizziness and tachycardia up to 120s-140s with standing next to the bed. Labs significant for K of 2.6. Slight prolonged QTc on ECG. CXR without acute cardio-plumonary process Received K orally and IV in the ED.  Patient admitted to [facility] service for dehydration, electrolyte derangement and weakness/fatigue in setting of COVID positive status. Inflammatory markers ordered.  Started on remdesivir.  Not on steroids as patient not hypoxic, no infiltrate on chest x-ray and apparently is prediabetic.  Subjective: No acute issues or events overnight.  No acute issues or events overnight, headache ongoing, nausea vomiting ongoing diarrhea ongoing but marginally improved.  Assessment & Plan: Active Problems:   Hypertension   Hypokalemia   COVID-19 virus infection   Dehydration   Prediabetes   Gastroenteritis   Acute COVID-19 illness with gastroenteritis, POA: Concurrent intractable nausea vomiting diarrhea Recent Labs    11/07/19 0004 11/08/19 0147 11/09/19 0123  DDIMER 0.72* 0.64* 0.52*  FERRITIN 31  --   --   LDH 185  --   --   CRP 0.9 2.5* 2.7*  Remdesivir to complete on 1/15. Continue supportive care, IV fluids, advance diet slowly - downgraded to full liquid diet 13th  Hypertension:   Continue home losartan and metoprolol  Significant hypokalemia, ongoing 2/2 above: Likely in the setting of above intractable  diarrhea with poor p.o. intake Repleted today (2.8) - follow am labs  Prediabetes:  Hold Metformin for now in the setting of dehydration and poor p.o. intake  Rheumatoid arthritis: On Arava daily and Prolia subcu shots every 6 months.  Primary lateral sclerosis: Resume home medications when able to tolerate p.o.  Essential tremors: Resume primidone  Mild cognitive impairment: Resume Aricept/Namenda  Hyperlipidemia: Resume statins  Obstructive sleep apnea: Will hold off on CPAP given above (high risk of aspiration given nausea/vomiting)   DVT prophylaxis: Lovenox Code Status: Full code Family / Patient Communication: Discussed with patient Disposition Plan: Home when able to tolerate p.o. likely in the next 24 to 48 hours.  Patient continues as inpatient given a markedly poor p.o. intake, dehydration, need for IV fluids and IV medications to control her symptoms as she cannot tolerate p.o safely  Objective: Vitals:   11/09/19 0420 11/09/19 0740 11/09/19 1115 11/09/19 1120  BP: (!) 144/67  (!) 154/62   Pulse: 79  69   Resp: 20     Temp: 98.9 F (37.2 C) 98.8 F (37.1 C)  98.5 F (36.9 C)  TempSrc: Oral Axillary  Oral  SpO2: 91%     Weight:      Height:        Intake/Output Summary (Last 24 hours) at 11/09/2019 1612 Last data filed at 11/09/2019 1600 Gross per 24 hour  Intake 2996.55 ml  Output 200 ml  Net 2796.55 ml   Filed Weights   11/06/19 1607 11/07/19 1253  Weight: 81.2 kg 88.6 kg    Physical Examination:  General:  Pleasantly resting in bed, No acute distress. HEENT:  Normocephalic atraumatic.  Sclerae nonicteric, noninjected.  Extraocular movements intact bilaterally. Neck:  Without mass or deformity.  Trachea is midline. Lungs:  Clear to auscultate bilaterally without rhonchi, wheeze, or rales. Heart:  Regular rate and rhythm.  Without murmurs, rubs, or gallops. Abdomen:  Soft, nontender, nondistended.  Without guarding or rebound. Extremities: Without  cyanosis, clubbing, edema, or obvious deformity. Vascular:  Dorsalis pedis and posterior tibial pulses palpable bilaterally. Skin:  Warm and dry, no erythema, no ulcerations.  Data Reviewed: I have personally reviewed following labs and imaging studies  CBC: Recent Labs  Lab 11/06/19 1617 11/07/19 0004 11/07/19 0756 11/08/19 0147 11/09/19 0123  WBC 5.1 5.6 5.5 4.7 4.4  NEUTROABS  --  3.9  --   --   --   HGB 12.9 11.1* 10.9* 11.0* 10.8*  HCT 40.6 36.1 34.9* 34.4* 34.1*  MCV 93.8 96.0 94.8 93.7 92.7  PLT 298 267 252 252 [id number]   Basic Metabolic Panel: Recent Labs  Lab 11/06/19 1617 11/06/19 2148 11/07/19 0245 11/07/19 0756 11/08/19 0147 11/09/19 0123  NA 139  --   --  139 137 139  K 2.6*  --   --  3.4* 3.7 2.8*  CL 100  --   --  103 106 105  CO2 25  --   --  25 21* 26  GLUCOSE 111*  --   --  115* 78 94  BUN <5*  --   --  <5* 5* 7*  CREATININE 0.66  --  0.70 0.67 0.64 0.62  CALCIUM 9.0  --   --  8.3* 8.0* 8.0*  MG  --  2.1  --   --   --   --    GFR: Estimated Creatinine Clearance: 75.4 mL/min (by C-G formula based on SCr of 0.62 mg/dL).   Liver Function Tests: Recent Labs  Lab 11/07/19 0004 11/07/19 0756 11/08/19 0147 11/09/19 0123  AST 30 32 29 25  ALT 27 25 22 20   ALKPHOS 59 59 51 51  BILITOT 0.5 0.2* 0.9 0.2*  PROT 6.2* 6.1* 6.0* 5.5*  ALBUMIN 3.5 3.3* 3.1* 2.9*   Lipid Profile: Recent Labs    11/07/19 0004  TRIG 97   Anemia Panel: Recent Labs    11/07/19 0004  FERRITIN 31   Sepsis Labs: Recent Labs  Lab 11/07/19 0004  PROCALCITON <0.10  LATICACIDVEN 2.0*    No results found for this or any previous visit (from the past 240 hour(s)).    Radiology Studies: No results found.  Scheduled Meds: . acidophilus   Oral Daily  . atorvastatin  40 mg Oral Daily  . donepezil  10 mg Oral QHS  . enoxaparin (LOVENOX) injection  40 mg Subcutaneous Q24H  . gabapentin  300 mg Oral QHS  . losartan  50 mg Oral Daily  . Melatonin  5 mg Oral QHS  .  memantine  10 mg Oral BID  . metoprolol tartrate  25 mg Oral BID  . pantoprazole  40 mg Oral BID  . potassium chloride  40 mEq Oral Q2H  . primidone  50 mg Oral QHS  . venlafaxine XR  75 mg Oral Q breakfast   Continuous Infusions: . sodium chloride 70 mL/hr at 11/08/19 2125  . remdesivir 100 mg in NS 100 mL 100 mg (11/09/19 1114)     LOS: 2 days    Time spent: 55 minutes  Little Ishikawa, DO [facility] Pager [phone]  If 7PM-7AM, please contact night-coverage [email or web address] Password  [email or web address] 11/09/2019, 4:12 PM

## 2019-11-10 LAB — CBC
HCT: 33.2 % — ABNORMAL LOW (ref 36.0–46.0)
Hemoglobin: 10.7 g/dL — ABNORMAL LOW (ref 12.0–15.0)
MCH: 30.1 pg (ref 26.0–34.0)
MCHC: 32.2 g/dL (ref 30.0–36.0)
MCV: 93.3 fL (ref 80.0–100.0)
Platelets: 254 10*3/uL (ref 150–400)
RBC: 3.56 MIL/uL — ABNORMAL LOW (ref 3.87–5.11)
RDW: 14.6 % (ref 11.5–15.5)
WBC: 4.2 10*3/uL (ref 4.0–10.5)
nRBC: 0 % (ref 0.0–0.2)

## 2019-11-10 LAB — COMPREHENSIVE METABOLIC PANEL
ALT: 17 U/L (ref 0–44)
AST: 20 U/L (ref 15–41)
Albumin: 3 g/dL — ABNORMAL LOW (ref 3.5–5.0)
Alkaline Phosphatase: 50 U/L (ref 38–126)
Anion gap: 7 (ref 5–15)
BUN: 6 mg/dL — ABNORMAL LOW (ref 8–23)
CO2: 23 mmol/L (ref 22–32)
Calcium: 7.9 mg/dL — ABNORMAL LOW (ref 8.9–10.3)
Chloride: 109 mmol/L (ref 98–111)
Creatinine, Ser: 0.58 mg/dL (ref 0.44–1.00)
GFR calc Af Amer: >60 mL/min (ref >60)
GFR calc non Af Amer: >60 mL/min (ref >60)
Glucose, Bld: 91 mg/dL (ref 70–99)
Potassium: 3.5 mmol/L (ref 3.5–5.1)
Sodium: 139 mmol/L (ref 135–145)
Total Bilirubin: 0.5 mg/dL (ref 0.3–1.2)
Total Protein: 5.4 g/dL — ABNORMAL LOW (ref 6.5–8.1)

## 2019-11-10 LAB — C-REACTIVE PROTEIN: CRP: 2.7 mg/dL — ABNORMAL HIGH (ref <1.0)

## 2019-11-10 LAB — GLUCOSE, CAPILLARY: Glucose-Capillary: 96 mg/dL (ref 70–99)

## 2019-11-10 LAB — D-DIMER, QUANTITATIVE: D-Dimer, Quant: 0.66 ug/mL-FEU — ABNORMAL HIGH (ref 0.00–0.50)

## 2019-11-10 MED ORDER — FUROSEMIDE 20 MG PO TABS: 20.0000 mg | ORAL_TABLET | Freq: Every day | ORAL | 1 refills | Status: DC

## 2019-11-10 MED ORDER — ONDANSETRON HCL 4 MG PO TABS: 4.0000 mg | ORAL_TABLET | Freq: Every day | ORAL | 1 refills | Status: DC | PRN

## 2019-11-10 MED ORDER — PROMETHAZINE HCL 25 MG RE SUPP: 25.0000 mg | Freq: Four times a day (QID) | RECTAL | 1 refills | Status: DC | PRN

## 2019-11-10 NOTE — Progress Notes (Signed)
PROGRESS NOTE    Mia [name]  [id number]  DOB: Oct 28, 1950  PCP: Hali Marry, MD Admit date:11/06/2019  68 y.o. female with medical history significant for pre-diabetes, primary lateral sclerosis, HTN, rheumatoid arthritis, hypothyroidism who was recently diagnosed with Covid 19 on 1/9 presents to the ED with complaints of diarrhea, vomiting, inability to tolerate p.o. intake and fatigue since diagnosed with Covid.  She also reports feeling wobbly on walking. In ED patient found to be afebrile, with generalized weakness, unable to ambulate independently due to dizziness and tachycardia up to 120s-140s with standing next to the bed. Labs significant for K of 2.6. Slight prolonged QTc on ECG. CXR without acute cardio-plumonary process Received K orally and IV in the ED.  Patient admitted to [facility] service for dehydration, electrolyte derangement and weakness/fatigue in setting of COVID positive status. Inflammatory markers ordered.  Started on remdesivir.  Not on steroids as patient not hypoxic, no infiltrate on chest x-ray and apparently is prediabetic.  Subjective: No acute issues or events overnight.  No acute issues or events overnight, headache ongoing, nausea vomiting resolving; diarrhea ongoing but marginally improved. Still reporting 6+BM per day  Assessment & Plan: Active Problems:   Hypertension   Hypokalemia   COVID-19 virus infection   Dehydration   Prediabetes   Gastroenteritis   Acute COVID-19 illness with gastroenteritis, POA: Concurrent intractable nausea vomiting diarrhea Recent Labs    11/08/19 0147 11/09/19 0123 11/10/19 0056  DDIMER 0.64* 0.52* 0.66*  CRP 2.5* 2.7* 2.7*  Remdesivir to complete on 1/15. Continue supportive care, IV fluids, advance diet slowly - advance diet to soft  Hypertension:   Continue home losartan and metoprolol  Significant hypokalemia, ongoing 2/2 above: Lab Results  Component Value Date   K 3.5 11/10/2019  Likely in the  setting of above intractable diarrhea with poor p.o. intake Follow am labs  Prediabetes:  Hold Metformin for now in the setting of dehydration and poor p.o. intake  Rheumatoid arthritis: On Arava daily and Prolia subcu shots every 6 months.  Primary lateral sclerosis: Resume home medications when able to tolerate p.o.  Essential tremors: Resume primidone  Mild cognitive impairment: Resume Aricept/Namenda  Hyperlipidemia: Resume statins  Obstructive sleep apnea: Will hold off on CPAP given above (high risk of aspiration given nausea/vomiting)   DVT prophylaxis: Lovenox Code Status: Full code Family / Patient Communication: Discussed with patient Disposition Plan: Home when able to tolerate p.o. likely in the next 24 to 48 hours.  Patient continues as inpatient given a markedly poor p.o. intake, dehydration, need for IV fluids and IV medications to control her symptoms as she cannot tolerate p.o safely  Objective: Vitals:   11/09/19 2013 11/09/19 2208 11/10/19 0815 11/10/19 1046  BP: (!) 161/76 (!) 150/51  (!) 162/81  Pulse: 66 72 73   Resp:   (!) 21   Temp: 99.8 F (37.7 C)  98.7 F (37.1 C)   TempSrc: Oral  Oral   SpO2:   96%   Weight:      Height:        Intake/Output Summary (Last 24 hours) at 11/10/2019 1052 Last data filed at 11/10/2019 0145 Gross per 24 hour  Intake 1308.06 ml  Output 3 ml  Net 1305.06 ml   Filed Weights   11/06/19 1607 11/07/19 1253  Weight: 81.2 kg 88.6 kg    Physical Examination:  General:  Pleasantly resting in bed, No acute distress. HEENT:  Normocephalic atraumatic.  Sclerae nonicteric, noninjected.  Extraocular  movements intact bilaterally. Neck:  Without mass or deformity.  Trachea is midline. Lungs:  Clear to auscultate bilaterally without rhonchi, wheeze, or rales. Heart:  Regular rate and rhythm.  Without murmurs, rubs, or gallops. Abdomen:  Soft, nontender, nondistended.  Without guarding or rebound. Extremities: Without  cyanosis, clubbing, edema, or obvious deformity. Vascular:  Dorsalis pedis and posterior tibial pulses palpable bilaterally. Skin:  Warm and dry, no erythema, no ulcerations.  Data Reviewed: I have personally reviewed following labs and imaging studies  CBC: Recent Labs  Lab 11/07/19 0004 11/07/19 0756 11/08/19 0147 11/09/19 0123 11/10/19 0056  WBC 5.6 5.5 4.7 4.4 4.2  NEUTROABS 3.9  --   --   --   --   HGB 11.1* 10.9* 11.0* 10.8* 10.7*  HCT 36.1 34.9* 34.4* 34.1* 33.2*  MCV 96.0 94.8 93.7 92.7 93.3  PLT 267 252 252 263 0000000   Basic Metabolic Panel: Recent Labs  Lab 11/06/19 1617 11/06/19 1617 11/06/19 2148 11/07/19 0245 11/07/19 0756 11/08/19 0147 11/09/19 0123 11/10/19 0056  NA 139  --   --   --  139 137 139 139  K 2.6*  --   --   --  3.4* 3.7 2.8* 3.5  CL 100  --   --   --  103 106 105 109  CO2 25  --   --   --  25 21* 26 23  GLUCOSE 111*  --   --   --  115* 78 94 91  BUN <5*  --   --   --  <5* 5* 7* 6*  CREATININE 0.66   < >  --  0.70 0.67 0.64 0.62 0.58  CALCIUM 9.0  --   --   --  8.3* 8.0* 8.0* 7.9*  MG  --   --  2.1  --   --   --   --   --    < > = values in this interval not displayed.   GFR: Estimated Creatinine Clearance: 75.4 mL/min (by C-G formula based on SCr of 0.58 mg/dL).   Liver Function Tests: Recent Labs  Lab 11/07/19 0004 11/07/19 0756 11/08/19 0147 11/09/19 0123 11/10/19 0056  AST 30 32 29 25 20   ALT 27 25 22 20 17   ALKPHOS 59 59 51 51 50  BILITOT 0.5 0.2* 0.9 0.2* 0.5  PROT 6.2* 6.1* 6.0* 5.5* 5.4*  ALBUMIN 3.5 3.3* 3.1* 2.9* 3.0*   Lipid Profile: No results for input(s): CHOL, HDL, LDLCALC, TRIG, CHOLHDL, LDLDIRECT in the last 72 hours. Anemia Panel: No results for input(s): VITAMINB12, FOLATE, FERRITIN, TIBC, IRON, RETICCTPCT in the last 72 hours. Sepsis Labs: Recent Labs  Lab 11/07/19 0004  PROCALCITON <0.10  LATICACIDVEN 2.0*    No results found for this or any previous visit (from the past 240 hour(s)).    Radiology  Studies: No results found.  Scheduled Meds: . acidophilus   Oral Daily  . atorvastatin  40 mg Oral Daily  . donepezil  10 mg Oral QHS  . enoxaparin (LOVENOX) injection  40 mg Subcutaneous Q24H  . gabapentin  300 mg Oral QHS  . losartan  50 mg Oral Daily  . Melatonin  5 mg Oral QHS  . memantine  10 mg Oral BID  . metoprolol tartrate  25 mg Oral BID  . pantoprazole  40 mg Oral BID  . primidone  50 mg Oral QHS  . sodium chloride flush  10-40 mL Intracatheter Q12H  . venlafaxine XR  75 mg Oral Q breakfast   Continuous Infusions: . sodium chloride 70 mL/hr at 11/10/19 0939  . remdesivir 100 mg in NS 100 mL 100 mg (11/10/19 1049)     LOS: 3 days    Time spent: 66 minutes  Little Ishikawa, DO [facility] Pager [phone]  If 7PM-7AM, please contact night-coverage [email or web address] Password [facility] 11/10/2019, 10:52 AM

## 2019-11-11 LAB — COMPREHENSIVE METABOLIC PANEL
ALT: 16 U/L (ref 0–44)
AST: 18 U/L (ref 15–41)
Albumin: 2.8 g/dL — ABNORMAL LOW (ref 3.5–5.0)
Alkaline Phosphatase: 56 U/L (ref 38–126)
Anion gap: 8 (ref 5–15)
BUN: 8 mg/dL (ref 8–23)
CO2: 23 mmol/L (ref 22–32)
Calcium: 7.8 mg/dL — ABNORMAL LOW (ref 8.9–10.3)
Chloride: 106 mmol/L (ref 98–111)
Creatinine, Ser: 0.6 mg/dL (ref 0.44–1.00)
GFR calc Af Amer: >60 mL/min (ref >60)
GFR calc non Af Amer: >60 mL/min (ref >60)
Glucose, Bld: 96 mg/dL (ref 70–99)
Potassium: 3.5 mmol/L (ref 3.5–5.1)
Sodium: 137 mmol/L (ref 135–145)
Total Bilirubin: 0.2 mg/dL — ABNORMAL LOW (ref 0.3–1.2)
Total Protein: 5.3 g/dL — ABNORMAL LOW (ref 6.5–8.1)

## 2019-11-11 LAB — CBC
HCT: 33.3 % — ABNORMAL LOW (ref 36.0–46.0)
Hemoglobin: 10.5 g/dL — ABNORMAL LOW (ref 12.0–15.0)
MCH: 29.4 pg (ref 26.0–34.0)
MCHC: 31.5 g/dL (ref 30.0–36.0)
MCV: 93.3 fL (ref 80.0–100.0)
Platelets: 279 10*3/uL (ref 150–400)
RBC: 3.57 MIL/uL — ABNORMAL LOW (ref 3.87–5.11)
RDW: 14.6 % (ref 11.5–15.5)
WBC: 6.3 10*3/uL (ref 4.0–10.5)
nRBC: 0 % (ref 0.0–0.2)

## 2019-11-11 LAB — D-DIMER, QUANTITATIVE: D-Dimer, Quant: 0.59 ug/mL-FEU — ABNORMAL HIGH (ref 0.00–0.50)

## 2019-11-11 LAB — C-REACTIVE PROTEIN: CRP: 2.4 mg/dL — ABNORMAL HIGH (ref <1.0)

## 2019-11-11 NOTE — Discharge Instructions (Signed)
COVID-19 COVID-19 is a respiratory infection that is caused by a virus called severe acute respiratory syndrome coronavirus 2 (SARS-CoV-2). The disease is also known as coronavirus disease or novel coronavirus. In some people, the virus may not cause any symptoms. In others, it may cause a serious infection. The infection can get worse quickly and can lead to complications, such as:  Pneumonia, or infection of the lungs.  Acute respiratory distress syndrome or ARDS. This is a condition in which fluid build-up in the lungs prevents the lungs from filling with air and passing oxygen into the blood.  Acute respiratory failure. This is a condition in which there is not enough oxygen passing from the lungs to the body or when carbon dioxide is not passing from the lungs out of the body.  Sepsis or septic shock. This is a serious bodily reaction to an infection.  Blood clotting problems.  Secondary infections due to bacteria or fungus.  Organ failure. This is when your body's organs stop working. The virus that causes COVID-19 is contagious. This means that it can spread from person to person through droplets from coughs and sneezes (respiratory secretions). What are the causes? This illness is caused by a virus. You may catch the virus by:  Breathing in droplets from an infected person. Droplets can be spread by a person breathing, speaking, singing, coughing, or sneezing.  Touching something, like a table or a doorknob, that was exposed to the virus (contaminated) and then touching your mouth, nose, or eyes. What increases the risk? Risk for infection You are more likely to be infected with this virus if you:  Are within 6 feet (2 meters) of a person with COVID-19.  Provide care for or live with a person who is infected with COVID-19.  Spend time in crowded indoor spaces or live in shared housing. Risk for serious illness You are more likely to become seriously ill from the virus if  you:  Are 51 years of age or older. The higher your age, the more you are at risk for serious illness.  Live in a nursing home or long-term care facility.  Have cancer.  Have a long-term (chronic) disease such as: ? Chronic lung disease, including chronic obstructive pulmonary disease or asthma. ? A long-term disease that lowers your body's ability to fight infection (immunocompromised). ? Heart disease, including heart failure, a condition in which the arteries that lead to the heart become narrow or blocked (coronary artery disease), a disease which makes the heart muscle thick, weak, or stiff (cardiomyopathy). ? Diabetes. ? Chronic kidney disease. ? Sickle cell disease, a condition in which red blood cells have an abnormal "sickle" shape. ? Liver disease.  Are obese. What are the signs or symptoms? Symptoms of this condition can range from mild to severe. Symptoms may appear any time from 2 to 14 days after being exposed to the virus. They include:  A fever or chills.  A cough.  Difficulty breathing.  Headaches, body aches, or muscle aches.  Runny or stuffy (congested) nose.  A sore throat.  New loss of taste or smell. Some people may also have stomach problems, such as nausea, vomiting, or diarrhea. Other people may not have any symptoms of COVID-19. How is this diagnosed? This condition may be diagnosed based on:  Your signs and symptoms, especially if: ? You live in an area with a COVID-19 outbreak. ? You recently traveled to or from an area where the virus is common. ? You  provide care for or live with a person who was diagnosed with COVID-19. ? You were exposed to a person who was diagnosed with COVID-19.  A physical exam.  Lab tests, which may include: ? Taking a sample of fluid from the back of your nose and throat (nasopharyngeal fluid), your nose, or your throat using a swab. ? A sample of mucus from your lungs (sputum). ? Blood tests.  Imaging tests,  which may include, X-rays, CT scan, or ultrasound. How is this treated? At present, there is no medicine to treat COVID-19. Medicines that treat other diseases are being used on a trial basis to see if they are effective against COVID-19. Your health care provider will talk with you about ways to treat your symptoms. For most people, the infection is mild and can be managed at home with rest, fluids, and over-the-counter medicines. Treatment for a serious infection usually takes places in a hospital intensive care unit (ICU). It may include one or more of the following treatments. These treatments are given until your symptoms improve.  Receiving fluids and medicines through an IV.  Supplemental oxygen. Extra oxygen is given through a tube in the nose, a face mask, or a hood.  Positioning you to lie on your stomach (prone position). This makes it easier for oxygen to get into the lungs.  Continuous positive airway pressure (CPAP) or bi-level positive airway pressure (BPAP) machine. This treatment uses mild air pressure to keep the airways open. A tube that is connected to a motor delivers oxygen to the body.  Ventilator. This treatment moves air into and out of the lungs by using a tube that is placed in your windpipe.  Tracheostomy. This is a procedure to create a hole in the neck so that a breathing tube can be inserted.  Extracorporeal membrane oxygenation (ECMO). This procedure gives the lungs a chance to recover by taking over the functions of the heart and lungs. It supplies oxygen to the body and removes carbon dioxide. Follow these instructions at home: Lifestyle  If you are sick, stay home except to get medical care. Your health care provider will tell you how long to stay home. Call your health care provider before you go for medical care.  Rest at home as told by your health care provider.  Do not use any products that contain nicotine or tobacco, such as cigarettes,  e-cigarettes, and chewing tobacco. If you need help quitting, ask your health care provider.  Return to your normal activities as told by your health care provider. Ask your health care provider what activities are safe for you. General instructions  Take over-the-counter and prescription medicines only as told by your health care provider.  Drink enough fluid to keep your urine pale yellow.  Keep all follow-up visits as told by your health care provider. This is important. How is this prevented?  There is no vaccine to help prevent COVID-19 infection. However, there are steps you can take to protect yourself and others from this virus. To protect yourself:   Do not travel to areas where COVID-19 is a risk. The areas where COVID-19 is reported change often. To identify high-risk areas and travel restrictions, check the CDC travel website: FatFares.com.br  If you live in, or must travel to, an area where COVID-19 is a risk, take precautions to avoid infection. ? Stay away from people who are sick. ? Wash your hands often with soap and water for 20 seconds. If soap and water  are not available, use an alcohol-based hand sanitizer. ? Avoid touching your mouth, face, eyes, or nose. ? Avoid going out in public, follow guidance from your state and local health authorities. ? If you must go out in public, wear a cloth face covering or face mask. Make sure your mask covers your nose and mouth. ? Avoid crowded indoor spaces. Stay at least 6 feet (2 meters) away from others. ? Disinfect objects and surfaces that are frequently touched every day. This may include:  Counters and tables.  Doorknobs and light switches.  Sinks and faucets.  Electronics, such as phones, remote controls, keyboards, computers, and tablets. To protect others: If you have symptoms of COVID-19, take steps to prevent the virus from spreading to others.  If you think you have a COVID-19 infection, contact  your health care provider right away. Tell your health care team that you think you may have a COVID-19 infection.  Stay home. Leave your house only to seek medical care. Do not use public transport.  Do not travel while you are sick.  Wash your hands often with soap and water for 20 seconds. If soap and water are not available, use alcohol-based hand sanitizer.  Stay away from other members of your household. Let healthy household members care for children and pets, if possible. If you have to care for children or pets, wash your hands often and wear a mask. If possible, stay in your own room, separate from others. Use a different bathroom.  Make sure that all people in your household wash their hands well and often.  Cough or sneeze into a tissue or your sleeve or elbow. Do not cough or sneeze into your hand or into the air.  Wear a cloth face covering or face mask. Make sure your mask covers your nose and mouth. Where to find more information  Centers for Disease Control and Prevention: PurpleGadgets.be  World Health Organization: https://www.castaneda.info/ Contact a health care provider if:  You live in or have traveled to an area where COVID-19 is a risk and you have symptoms of the infection.  You have had contact with someone who has COVID-19 and you have symptoms of the infection. Get help right away if:  You have trouble breathing.  You have pain or pressure in your chest.  You have confusion.  You have bluish lips and fingernails.  You have difficulty waking from sleep.  You have symptoms that get worse. These symptoms may represent a serious problem that is an emergency. Do not wait to see if the symptoms will go away. Get medical help right away. Call your local emergency services (911 in the [place] Do not drive yourself to the hospital. Let the emergency medical personnel know if you think you have  COVID-19. Summary  COVID-19 is a respiratory infection that is caused by a virus. It is also known as coronavirus disease or novel coronavirus. It can cause serious infections, such as pneumonia, acute respiratory distress syndrome, acute respiratory failure, or sepsis.  The virus that causes COVID-19 is contagious. This means that it can spread from person to person through droplets from breathing, speaking, singing, coughing, or sneezing.  You are more likely to develop a serious illness if you are 70 years of age or older, have a weak immune system, live in a nursing home, or have chronic disease.  There is no medicine to treat COVID-19. Your health care provider will talk with you about ways to treat your symptoms.  Take steps to protect yourself and others from infection. Wash your hands often and disinfect objects and surfaces that are frequently touched every day. Stay away from people who are sick and wear a mask if you are sick. This information is not intended to replace advice given to you by your health care provider. Make sure you discuss any questions you have with your health care provider. Document Revised: 08/11/2019 Document Reviewed: 11/17/2018 Elsevier Patient Education  2020 Else[employer] Inc.  COVID-19: Quarantine vs. Isolation QUARANTINE keeps someone who was in close contact with someone who has COVID-19 away from others. If you had close contact with a person who has COVID-19  Stay home until 14 days after your last contact.  Check your temperature twice a day and watch for symptoms of COVID-19.  If possible, stay away from people who are at higher-risk for getting very sick from COVID-19. ISOLATION keeps someone who is sick or tested positive for COVID-19 without symptoms away from others, even in their own home. If you are sick and think or know you have COVID-19  Stay home until after ? At least 10 days since symptoms first appeared and ? At least 24 hours with  no fever without fever-reducing medication and ? Symptoms have improved If you tested positive for COVID-19 but do not have symptoms  Stay home until after ? 10 days have passed since your positive test If you live with others, stay in a specific "sick room" or area and away from other people or animals, including pets. Use a separate bathroom, if available. cdc.gov/coronavirus 05/15/2019 This information is not intended to replace advice given to you by your health care provider. Make sure you discuss any questions you have with your health care provider. Document Revised: 09/28/2019 Document Reviewed: 09/28/2019 Elsevier Patient Education  2020 Elsevier Inc.  

## 2019-11-15 ENCOUNTER — Telehealth: Payer: Self-pay | Admitting: Family Medicine

## 2019-11-15 MED ORDER — TRAZODONE HCL 50 MG PO TABS: 25.0000 mg | ORAL_TABLET | Freq: Every day | ORAL | 0 refills | Status: DC

## 2019-11-15 NOTE — Telephone Encounter (Signed)
Okay, prescription sent for trazodone.  Start with half a tab and then okay to go up to a whole tab if needed.

## 2019-11-15 NOTE — Telephone Encounter (Signed)
I called patient and she is aware and did not have any questions.

## 2019-11-15 NOTE — Telephone Encounter (Signed)
Patient called and was discharged from the hospital today and has a hospital follow up for next week. She is a virtual and is in 40 minute slot. She wants to know if you can send in something short term for sleep. [facility] on file is correct. Please advise.

## 2019-11-16 ENCOUNTER — Telehealth: Payer: Self-pay

## 2019-11-16 ENCOUNTER — Other Ambulatory Visit: Payer: Self-pay

## 2019-11-16 ENCOUNTER — Emergency Department (HOSPITAL_COMMUNITY)
Admission: EM | Admit: 2019-11-16 | Discharge: 2019-11-16 | Disposition: A | Discharge status: Home / Self Care | Payer: Medicare Other | Attending: Emergency Medicine | Admitting: Emergency Medicine

## 2019-11-16 ENCOUNTER — Encounter (HOSPITAL_COMMUNITY): Payer: Self-pay

## 2019-11-16 DIAGNOSIS — Z8616 Personal history of COVID-19: Secondary | ICD-10-CM | POA: Insufficient documentation

## 2019-11-16 DIAGNOSIS — E039 Hypothyroidism, unspecified: Secondary | ICD-10-CM | POA: Diagnosis not present

## 2019-11-16 DIAGNOSIS — I10 Essential (primary) hypertension: Secondary | ICD-10-CM | POA: Insufficient documentation

## 2019-11-16 DIAGNOSIS — R0902 Hypoxemia: Secondary | ICD-10-CM | POA: Diagnosis not present

## 2019-11-16 DIAGNOSIS — E876 Hypokalemia: Secondary | ICD-10-CM | POA: Diagnosis not present

## 2019-11-16 DIAGNOSIS — Z79899 Other long term (current) drug therapy: Secondary | ICD-10-CM | POA: Insufficient documentation

## 2019-11-16 DIAGNOSIS — Z7984 Long term (current) use of oral hypoglycemic drugs: Secondary | ICD-10-CM | POA: Insufficient documentation

## 2019-11-16 DIAGNOSIS — Z96619 Presence of unspecified artificial shoulder joint: Secondary | ICD-10-CM | POA: Diagnosis not present

## 2019-11-16 DIAGNOSIS — E119 Type 2 diabetes mellitus without complications: Secondary | ICD-10-CM | POA: Insufficient documentation

## 2019-11-16 DIAGNOSIS — Z96659 Presence of unspecified artificial knee joint: Secondary | ICD-10-CM | POA: Insufficient documentation

## 2019-11-16 DIAGNOSIS — R197 Diarrhea, unspecified: Secondary | ICD-10-CM

## 2019-11-16 DIAGNOSIS — U071 COVID-19: Secondary | ICD-10-CM | POA: Diagnosis not present

## 2019-11-16 DIAGNOSIS — W19XXXA Unspecified fall, initial encounter: Secondary | ICD-10-CM | POA: Diagnosis not present

## 2019-11-16 LAB — COMPREHENSIVE METABOLIC PANEL
ALT: 22 U/L (ref 0–44)
AST: 30 U/L (ref 15–41)
Albumin: 3.7 g/dL (ref 3.5–5.0)
Alkaline Phosphatase: 84 U/L (ref 38–126)
Anion gap: 12 (ref 5–15)
BUN: 6 mg/dL — ABNORMAL LOW (ref 8–23)
CO2: 24 mmol/L (ref 22–32)
Calcium: 8.8 mg/dL — ABNORMAL LOW (ref 8.9–10.3)
Chloride: 105 mmol/L (ref 98–111)
Creatinine, Ser: 0.66 mg/dL (ref 0.44–1.00)
GFR calc Af Amer: >60 mL/min (ref >60)
GFR calc non Af Amer: >60 mL/min (ref >60)
Glucose, Bld: 102 mg/dL — ABNORMAL HIGH (ref 70–99)
Potassium: 3 mmol/L — ABNORMAL LOW (ref 3.5–5.1)
Sodium: 141 mmol/L (ref 135–145)
Total Bilirubin: 0.4 mg/dL (ref 0.3–1.2)
Total Protein: 7.2 g/dL (ref 6.5–8.1)

## 2019-11-16 LAB — CBC
HCT: 39.4 % (ref 36.0–46.0)
Hemoglobin: 12.4 g/dL (ref 12.0–15.0)
MCH: 29.7 pg (ref 26.0–34.0)
MCHC: 31.5 g/dL (ref 30.0–36.0)
MCV: 94.5 fL (ref 80.0–100.0)
Platelets: 377 10*3/uL (ref 150–400)
RBC: 4.17 MIL/uL (ref 3.87–5.11)
RDW: 14.6 % (ref 11.5–15.5)
WBC: 7.4 10*3/uL (ref 4.0–10.5)
nRBC: 0 % (ref 0.0–0.2)

## 2019-11-16 MED ORDER — POTASSIUM CHLORIDE 10 MEQ/100ML IV SOLN
10.0000 meq | Freq: Once | INTRAVENOUS | Status: AC
  Administered 2019-11-16: 22:00:00 10 meq via INTRAVENOUS
  Filled 2019-11-16: qty 100

## 2019-11-16 MED ORDER — POTASSIUM CHLORIDE CRYS ER 20 MEQ PO TBCR: 20.0000 meq | EXTENDED_RELEASE_TABLET | Freq: Every day | ORAL | 0 refills | Status: DC

## 2019-11-16 MED ORDER — POTASSIUM CHLORIDE CRYS ER 20 MEQ PO TBCR
40.0000 meq | EXTENDED_RELEASE_TABLET | Freq: Once | ORAL | Status: AC
  Administered 2019-11-16: 40 meq via ORAL
  Filled 2019-11-16: qty 2

## 2019-11-16 MED ORDER — ACETAMINOPHEN 500 MG PO TABS
1000.0000 mg | ORAL_TABLET | Freq: Once | ORAL | Status: AC
  Administered 2019-11-16: 22:00:00 1000 mg via ORAL
  Filled 2019-11-16: qty 2

## 2019-11-16 MED ORDER — SODIUM CHLORIDE 0.9 % IV BOLUS
500.0000 mL | Freq: Once | INTRAVENOUS | Status: AC
  Administered 2019-11-16: 500 mL via INTRAVENOUS

## 2019-11-16 NOTE — ED Provider Notes (Signed)
[facility] DEPT Provider Note   CSN: [id number] Arrival date & time: 11/16/19  1929     History Chief Complaint  Patient presents with  . Diarrhea    Covid-19 Positive     Mia [name] is a 69 y.o. female.  Patient indicates covid+ on 1/9, recent admission for same, symptoms improved, but in past day, acute onset diarrhea. States 10-20 episodes last night, watery, not bloody. No abd pain or distension. Nausea, no vomiting. No fever or chills. Denies cough or sob. No chest pain. No dysuria or gu c/o. Notes recent low k when in hospital. Denies faintness or dizziness. No antibiotic use, no bad food ingestion or ill contacts.   The history is provided by the patient.  Diarrhea Associated symptoms: no abdominal pain, no fever, no headaches and no vomiting        Past Medical History:  Diagnosis Date  . Anal fissure   . Anemia   . Arthritis   . Blood transfusion   . C. difficile colitis   . Chest pain    a. 01/2013 MV: EF 59%, no ischemia.  . Chronic Dyspnea    a. 01/2013 Echo: EF 60-65%, Gr 1 DD, PASP 67mmHg.  Marland Kitchen COVID-19   . Diabetes mellitus   . Fibromyalgia   . Gall stones   . GERD (gastroesophageal reflux disease)    gastritis  . Hemorrhoids   . Hypertension   . Interstitial cystitis   . MCI (mild cognitive impairment) 11/25/2017  . Memory difficulty 12/14/2013  . Neuromuscular disorder ([place])    sclerosis  . Osteoporosis   . Peptic ulcer   . PONV (postoperative nausea and vomiting)   . Pseudogout   . Raynaud phenomenon   . Rectal bleeding   . Sleep apnea    a. on cpap.  Marland Kitchen SVT (supraventricular tachycardia) ([place])   . Syncope 09/10/2015  . Thyroid disease    hypothyroidism  . Tremor, essential 05/14/2016    Patient Active Problem List   Diagnosis Date Noted  . Gastroenteritis 11/07/2019  . COVID-19 virus infection 11/06/2019  . Dehydration 11/06/2019  . Prediabetes 11/06/2019  . Gait instability 06/20/2019  . Facet  arthritis of lumbar region 05/10/2018  . Hypokalemia 05/10/2018  . MCI (mild cognitive impairment) 11/25/2017  . Restrictive lung disease 07/30/2017  . Mixed hyperlipidemia 06/18/2017  . Rectocele 04/26/2017  . Irritable bowel syndrome with both constipation and diarrhea 04/26/2017  . Osteoporosis 04/18/2017  . Trochanteric bursitis of both hips 02/24/2017  . Stress fracture of left tibia 11/05/2016  . Inflammatory arthritis 09/30/2016  . High risk medication use 09/30/2016  . History of Clostridium difficile colitis 09/30/2016  . 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 Pseudogout 09/30/2016  . DDD cervical spine status post fusion 09/30/2016  . DDD thoracic spine 09/30/2016  . H/O total knee replacement, right 09/30/2016  . Age-related osteoporosis without current pathological fracture 09/30/2016  . Tremor, essential 05/14/2016  . Right foot pain 04/14/2016  . B12 deficiency 09/10/2015  . Syncope 09/10/2015  . Cervical facet joint syndrome 09/10/2015  . Chronic fatigue 09/10/2015  . Aortic atherosclerosis ([place]) 04/01/2015  . DOE (dyspnea on exertion)   . Primary osteoarthritis of right knee 08/13/2014  . Benign head tremor 06/11/2014  . Lumbar degenerative disc disease 06/11/2014  . IFG (impaired fasting glucose) 03/09/2014  . Hemorrhoid 03/09/2014  . Retinal wrinkling, right eye 03/09/2014  . Fibromyalgia 03/09/2014  . GERD (gastroesophageal reflux disease) 03/09/2014  . History  of arthroplasty of right knee 01/31/2014  . Memory difficulty 12/14/2013  . Hemorrhoids, external, thrombosed 06/22/2011  . Hypertension 03/11/2011  . SVT (supraventricular tachycardia) ([place])   . Raynaud phenomenon   . EDEMA 08/25/2010  . Nonspecific (abnormal) findings on radiological and other examination of body structure 08/25/2010  . COMPUTERIZED TOMOGRAPHY, CHEST, ABNORMAL 08/25/2010  . OSA (obstructive sleep apnea) 04/24/2009  . Allergic rhinitis 06/11/2008  . Dyspnea 06/11/2008   . 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 Chronic diastolic CHF (congestive heart failure) ([place]) 06/08/2008  . INTERSTITIAL CYSTITIS 06/08/2008    Past Surgical History:  Procedure Laterality Date  . APPENDECTOMY    . BLADDER SURGERY     x2  . BLADDER SURGERY    . BREAST BIOPSY    . CARDIAC CATHETERIZATION    . CARPAL TUNNEL RELEASE    . CATARACT EXTRACTION Bilateral   . CHOLECYSTECTOMY    . DILATION AND CURETTAGE OF UTERUS    . ENTEROCELE REPAIR     x2  . EYE SURGERY     retina  . hysterectomy - unknown type    . JOINT REPLACEMENT    . KNEE ARTHROPLASTY    . KNEE SURGERY     x6  . NECK SURGERY     fusion  . OOPHORECTOMY    . QUADRICEPS REPAIR Right   . RECTOCELE REPAIR     x2  . SHOULDER ARTHROSCOPY WITH SUBACROMIAL DECOMPRESSION, ROTATOR CUFF REPAIR AND BICEP TENDON REPAIR  10/06/2012   Procedure: SHOULDER ARTHROSCOPY WITH SUBACROMIAL DECOMPRESSION, ROTATOR CUFF REPAIR AND BICEP TENDON REPAIR;  Surgeon: Nita Sells, MD;  Location: [facility];  Service: Orthopedics;  Laterality: Right;  Arthroscopic  Repair  of  Subscapularis, Open Biceps Tenodesis  . SHOULDER SURGERY     bilateral- bones spur  . TONSILLECTOMY    . TOTAL KNEE ARTHROPLASTY    . TOTAL SHOULDER ARTHROPLASTY       OB History   No obstetric history on file.     Family History  Problem Relation Age of Onset  . Heart attack Mother   . Stroke Mother   . Diabetes Mother   . Heart disease Mother   . Colon polyps Mother   . Asthma Mother   . Breast cancer Maternal Grandmother   . Wilson's disease Maternal Grandmother   . Pancreatic cancer Maternal Grandfather   . Heart disease Father   . Heart attack Father   . Asthma Father   . Stroke Sister   . Hypertension Sister   . Rheum arthritis Sister   . Dementia Sister   . Asthma Sister   . Lupus Sister   . Asthma Sister     Social History   Tobacco Use  . Smoking status: Never Smoker  . Smokeless  tobacco: Never Used  Substance Use Topics  . Alcohol use: No    Alcohol/week: 0.0 standard drinks  . Drug use: No    Home Medications Prior to Admission medications   Medication Sig Start Date End Date Taking? Authorizing Provider  AMBULATORY NON FORMULARY MEDICATION Medication Name: One touch ultra strips. Check fasting blood sugar in the morning and as needed.  Dx - [id number] Fax to [phone] 02/04/17   Hali Marry, MD  atorvastatin (LIPITOR) 40 MG tablet TAKE 1 TABLET EVERY DAY Patient taking differently: Take 40 mg by mouth daily after breakfast.  01/16/19   Nahser, Wonda Cheng, MD  Cyanocobalamin (VITAMIN B-12 IJ) Inject 1 mL as  directed every 30 (thirty) days.     [provider]  denosumab (PROLIA) 60 MG/ML SOLN injection Inject 60 mg into the skin every 6 (six) months. Administer in upper arm, thigh, or abdomen    [provider]  donepezil (ARICEPT) 10 MG tablet TAKE 1 TABLET BY MOUTH AT BEDTIME Patient taking differently: Take 10 mg by mouth at bedtime.  06/15/19   Kathrynn Ducking, MD  fluticasone (FLONASE) 50 MCG/ACT nasal spray Place 2 sprays into both nostrils daily. Patient taking differently: Place 2 sprays into both nostrils daily as needed for allergies.  09/14/18   Emeterio Reeve, DO  furosemide (LASIX) 20 MG tablet Take 1 tablet (20 mg total) by mouth daily. Please make yearly appt with Dr. Acie Fredrickson for May before anymore refills. 1st attempt 11/15/19   Little Ishikawa, MD  gabapentin (NEURONTIN) 300 MG capsule TAKE 1 TO 2 CAPSULES BY MOUTH UP TO TWO TIMES DAILY AS NEEDED Patient taking differently: Take 300 mg by mouth at bedtime.  10/24/19   Hali Marry, MD  leflunomide (ARAVA) 20 MG tablet TAKE 1 TABLET EVERY DAY Patient taking differently: Take 20 mg by mouth daily.  09/04/19   Bo Merino, MD  losartan (COZAAR) 50 MG tablet TAKE 1 TABLET EVERY DAY Patient taking differently: Take 50 mg by mouth daily.  10/23/19   Nahser,  Wonda Cheng, MD  Melatonin 5 MG TABS Take 5 mg by mouth at bedtime.     [provider]  memantine (NAMENDA) 10 MG tablet TAKE 1 TABLET TWICE DAILY Patient taking differently: Take 10 mg by mouth 2 (two) times daily.  03/23/19   Kathrynn Ducking, MD  metFORMIN (GLUCOPHAGE-XR) 500 MG 24 hr tablet TAKE 2 TABLETS EVERY DAY WITH BREAKFAST Patient taking differently: Take 500 mg by mouth daily with breakfast.  09/24/19   Hali Marry, MD  metoprolol tartrate (LOPRESSOR) 100 MG tablet TAKE 1 AND 1/2 TABLETS TWICE DAILY Patient taking differently: Take 150 mg by mouth 2 (two) times daily.  01/16/19   Nahser, Wonda Cheng, MD  nitroGLYCERIN (NITROGLYN) 2 % OINT ointment Apply 1 application topically as needed. Apply topically as needed for symptomatic relief. Patient not taking: Reported on 11/07/2019 10/09/19   Bo Merino, MD  NON FORMULARY Place into the nose at bedtime. cpap machine    [provider]  ondansetron (ZOFRAN) 4 MG tablet Take 1 tablet (4 mg total) by mouth daily as needed for nausea or vomiting. 11/10/19 11/09/20  Little Ishikawa, MD  ONE TOUCH ULTRA TEST test strip USE TO TEST FASTING BLOOD SUGAR IN THE MORNING AND AS NEEDED Patient taking differently: 1 each by Other route as needed for other.  02/23/18   Hali Marry, MD  pantoprazole (PROTONIX) 40 MG tablet Take 40 mg by mouth 2 (two) times daily.    [provider]  polyethylene glycol powder (MIRALAX) powder Take 17 g by mouth daily as needed for mild constipation or moderate constipation.     [provider]  primidone (MYSOLINE) 50 MG tablet TAKE 1 TABLET AT BEDTIME Patient taking differently: Take 50 mg by mouth at bedtime.  10/16/19   Hali Marry, MD  Probiotic Product (PROBIOTIC ADVANCED PO) Take 1 capsule by mouth daily.    [provider]  promethazine (PHENERGAN) 25 MG suppository Place 1 suppository (25 mg total) rectally every 6 (six) hours as needed  for nausea. 11/10/19 11/09/20  Little Ishikawa, MD  traZODone (DESYREL) 50  MG tablet Take 0.5-1 tablets (25-50 mg total) by mouth at bedtime. 11/15/19   Hali Marry, MD  venlafaxine XR (EFFEXOR-XR) 75 MG 24 hr capsule TAKE 1 CAPSULE EVERY DAY WITH BREAKFAST Patient taking differently: Take 75 mg by mouth daily with breakfast.  03/21/19   Kathrynn Ducking, MD    Allergies    Codeine, Myrbetriq  [mirabegron], Percocet [oxycodone-acetaminophen], Celebrex [celecoxib], Darvocet [propoxyphene n-acetaminophen], Erythromycin, Hydrocodone, Lyrica [pregabalin], Macrodantin [nitrofurantoin], Toradol [ketorolac tromethamine], Tramadol, and Verapamil  Review of Systems   Review of Systems  Constitutional: Negative for fever.  HENT: Negative for sore throat.   Eyes: Negative for redness.  Respiratory: Negative for shortness of breath.   Cardiovascular: Negative for chest pain.  Gastrointestinal: Positive for diarrhea. Negative for abdominal pain and vomiting.  Endocrine: Negative for polyuria.  Genitourinary: Negative for dysuria and flank pain.  Musculoskeletal: Negative for back pain.  Skin: Negative for rash.  Neurological: Negative for headaches.  Hematological: Does not bruise/bleed easily.  Psychiatric/Behavioral: Negative for confusion.    Physical Exam Updated Vital Signs BP (!) 223/88 (BP Location: Left Arm)   Pulse 70   Temp 98.9 F (37.2 C) (Oral)   Resp 20   Ht 1.676 m (5\' 6" )   SpO2 95%   BMI 31.53 kg/m   Physical Exam Vitals and nursing note reviewed.  Constitutional:      Appearance: Normal appearance. She is well-developed.  HENT:     Head: Atraumatic.     Nose: Nose normal.     Mouth/Throat:     Mouth: Mucous membranes are moist.  Eyes:     General: No scleral icterus.    Conjunctiva/sclera: Conjunctivae normal.  Neck:     Trachea: No tracheal deviation.  Cardiovascular:     Rate and Rhythm: Normal rate and regular rhythm.     Pulses: Normal  pulses.     Heart sounds: Normal heart sounds. No murmur. No friction rub. No gallop.   Pulmonary:     Effort: Pulmonary effort is normal. No respiratory distress.     Breath sounds: Normal breath sounds.  Abdominal:     General: Bowel sounds are normal. There is no distension.     Palpations: Abdomen is soft.     Tenderness: There is no abdominal tenderness. There is no guarding.  Genitourinary:    Comments: No cva tenderness.  Musculoskeletal:        General: No swelling.     Cervical back: Normal range of motion and neck supple. No rigidity. No muscular tenderness.  Skin:    General: Skin is warm and dry.     Findings: No rash.  Neurological:     Mental Status: She is alert.     Comments: Alert, speech normal.   Psychiatric:        Mood and Affect: Mood normal.     ED Results / Procedures / Treatments   Labs (all labs ordered are listed, but only abnormal results are displayed) Results for orders placed or performed during the hospital encounter of 11/16/19  CBC  Result Value Ref Range   WBC 7.4 4.0 - 10.5 K/uL   RBC 4.17 3.87 - 5.11 MIL/uL   Hemoglobin 12.4 12.0 - 15.0 g/dL   HCT 39.4 36.0 - 46.0 %   MCV 94.5 80.0 - 100.0 fL   MCH 29.7 26.0 - 34.0 pg   MCHC 31.5 30.0 - 36.0 g/dL   RDW 14.6 11.5 - 15.5 %   Platelets 377  150 - 400 K/uL   nRBC 0.0 0.0 - 0.2 %  Comprehensive metabolic panel  Result Value Ref Range   Sodium 141 135 - 145 mmol/L   Potassium 3.0 (L) 3.5 - 5.1 mmol/L   Chloride 105 98 - 111 mmol/L   CO2 24 22 - 32 mmol/L   Glucose, Bld 102 (H) 70 - 99 mg/dL   BUN 6 (L) 8 - 23 mg/dL   Creatinine, Ser 0.66 0.44 - 1.00 mg/dL   Calcium 8.8 (L) 8.9 - 10.3 mg/dL   Total Protein 7.2 6.5 - 8.1 g/dL   Albumin 3.7 3.5 - 5.0 g/dL   AST 30 15 - 41 U/L   ALT 22 0 - 44 U/L   Alkaline Phosphatase 84 38 - 126 U/L   Total Bilirubin 0.4 0.3 - 1.2 mg/dL   GFR calc non Af Amer >60 >60 mL/min   GFR calc Af Amer >60 >60 mL/min   Anion gap 12 5 - 15   DG Chest  Portable 1 View  Result Date: 11/06/2019 CLINICAL DATA:  COVID.  Difficulty breathing. EXAM: PORTABLE CHEST 1 VIEW COMPARISON:  07/05/2017 FINDINGS: Heart and mediastinal contours are within normal limits. No focal opacities or effusions. No acute bony abnormality. IMPRESSION: Negative. Electronically Signed   By: Rolm Baptise M.D.   On: 11/06/2019 17:09    EKG None  Radiology No results found.  Procedures Procedures (including critical care time)  Medications Ordered in ED Medications  sodium chloride 0.9 % bolus 500 mL (has no administration in time range)    ED Course  I have reviewed the triage vital signs and the nursing notes.  Pertinent labs & imaging results that were available during my care of the patient were reviewed by me and considered in my medical decision making (see chart for details).    MDM Rules/Calculators/A&P                     Iv ns bolus. Labs sent.  Reviewed nursing notes and prior charts for additional history.   Labs reviewed/interpreted by me - k low, 3. kcl po and iv. Reviewed recent labs, mg normal then.   No recurrent diarrhea during stay in ED. Patient is tolerating po, and indicates feels ready for d/c.  Recheck abd soft nt.   rec pcp f/u re k and elev bp.   Return precautions provided.       Final Clinical Impression(s) / ED Diagnoses Final diagnoses:  None    Rx / DC Orders ED Discharge Orders    None       Lajean Saver, MD 11/16/19 2151

## 2019-11-16 NOTE — ED Notes (Signed)
Patient is tolerating fluids with no difficulty

## 2019-11-16 NOTE — Telephone Encounter (Signed)
Patient's daughter called, states patient is still having very bad symptoms. Trouble breathing, unable to eat/drink, diarrhea.  Advised daughter to take patient to hospital. She is agreeable and will take her now.   Pt's husband was admitted today also for COVID SX  FYI to PCP

## 2019-11-16 NOTE — ED Triage Notes (Signed)
Patient arrived via [facility], states she was recently discharged from [facility]. Patient had a low potassium of 2.5 during admission and PCP concerned with the diarrhea it may have dropped again. Patient also hypertensive with EMS, 180/100 reports stopped her blood pressure medication prior to admission.

## 2019-11-16 NOTE — Discharge Instructions (Addendum)
It was our pleasure to provide your ER care today - we hope that you feel better.  Rest. Drink plenty of fluids.  Your potassium level is low (3) - eat plenty of fruits and vegetables, take potassium supplement as prescribed, and follow up with primary care doctor in 1 week.  Return to ER if worse, new symptoms, fevers, new or severe pain, persistent vomiting, severe abdominal pain, weak/faint, or other concern.

## 2019-11-16 NOTE — ED Notes (Signed)
Patient ambulated around the room with no difficulty

## 2019-11-17 ENCOUNTER — Telehealth: Payer: Self-pay | Admitting: Cardiovascular Disease

## 2019-11-17 NOTE — Telephone Encounter (Signed)
Follow Up ° °Patient is returning call. Please give patient a call back.  °

## 2019-11-17 NOTE — Telephone Encounter (Signed)
Pt c/o BP issue: STAT if pt c/o blurred vision, one-sided weakness or slurred speech  1. What are your last 5 BP readings? 191/88, 196//could not remember   2. Are you having any other symptoms (ex. Dizziness, headache, blurred vision, passed out)? A little short of breath  3. What is your BP issue? Blood pressure running high- she was discharged Saturday- She Had COVID- pt said she would like to talk to Selinda Eon if possible- She had to go to [facility] ER last night- Diarrhea and Potassium was  3.0

## 2019-11-17 NOTE — Telephone Encounter (Signed)
lpmtcb 12/2 

## 2019-11-17 NOTE — Telephone Encounter (Signed)
I spoke to the patient who recently had CoVid.  She went to the ED on the evening of 1/21, because of diarrhea and dehydration.  Her Potassium was 3.0 and was started on Potassium 20 meq daily upon discharge.    Her BP has been elevated with a reading of 191/88, asymptomatic.  She was inquiring if she should restart her Lasix.  I told her to take her medication as prescribed, monitor her BP and symptoms over the weekend and we will reach out to her on 1/25.  She has a Virtual visit with Gae Bon on 2/12, but would like further advisement.

## 2019-11-20 ENCOUNTER — Telehealth: Payer: Self-pay | Admitting: Rheumatology

## 2019-11-20 NOTE — Telephone Encounter (Signed)
Called patient to assess how she is doing today. She states BP today is 193/89 mmHg 2 hours after taking metoprolol and losartan. She reports that vomiting has stopped and she is working on rehydrating herself. I advised her to continue to monitor BP and HR and write them down daily. She asks if she was supposed to be taking Lasix and I advised that it was stopped along with potassium chloride by Dr. Acie Fredrickson in May due to low BP. Patient was found to be hypokalemic during her recent hospital admission and has resumed K+ supplement at 20 mEq daily. I advised her to continue to monitor BP and if systolic BP is [id number] mmHg to take an additional losartan 50 mg. She agrees that the high BP is likely related to recent vomiting and dehydration and she understands to avoid high sodium diet and drink at least 64 oz water daily with some added electrolytes. I advised her to call back to report if high BP persists prior to her appointment with Dr. Acie Fredrickson on 2/9. She verbalized understanding and agreement and thanked me for the call.

## 2019-11-20 NOTE — Telephone Encounter (Signed)
Patient called stating she tested positive for COVID and spent 6  days in the hospital.  Patient states she was discharged on 11/18/19.  Patient states she was taken off [place] and is requesting a return call to let her know when she should restart the medication.

## 2019-11-20 NOTE — Telephone Encounter (Signed)
Patient states she tested positive for Covid. Patient spent 6 days in the hospital and is now home. Patient was advised when discharged to hold her [place]. Patient advised she would want to hold her medication for 4 weeks. Patient advised if she continues to have symptoms beyond that she will continue to hold medication.

## 2019-11-20 NOTE — Telephone Encounter (Signed)
I agree with note by Christen Bame, RN We will continue to monitor. We can consider adding amlodipine if needed for additional bp control

## 2019-11-22 ENCOUNTER — Telehealth: Payer: Self-pay | Admitting: Cardiovascular Disease

## 2019-11-22 ENCOUNTER — Ambulatory Visit: Payer: Medicare Other | Admitting: Cardiovascular Disease

## 2019-11-22 NOTE — Telephone Encounter (Signed)
I spoke to the patient and her daughter in regards to the patient's BP.  Apparently, they spoke to [facility] on Monday and were advised to take an extra Losartan if BP remains [id number] systolic after morning medications.  On Tuesday night the patient was experiencing a headache and SOB.  Her BP was 204/99, but they were not sure if they should take the Losartan in the evening.    Today 1/27 Wednesday, she experienced headache and SOB, checked her BP and it was 201/86, around 11:30 am.  She took another Losartan 50 mg and had not yet rechecked her BP.  She will recheck BP at some point and record value.  They would like to monitor for a few days and not yet come into office for appt.  I told her that I would have Sharyn Lull reach out to her on 1/28.

## 2019-11-22 NOTE — Telephone Encounter (Signed)
  Pt c/o BP issue: STAT if pt c/o blurred vision, one-sided weakness or slurred speech  1. What are your last 5 BP readings?  201/86 204/99   2. Are you having any other symptoms (ex. Dizziness, headache, blurred vision, passed out)? Dull headache  3. What is your BP issue? Patient is calling back to speak with Sharyn Lull to get some clarification on the directions that were given on 11/17/19. She states her blood pressure has been 201/86 at 11:20 am today and last night it was 204/99. She states she did take another losartan as she was told to do but she has some quesitons.

## 2019-11-22 NOTE — Telephone Encounter (Signed)
error 

## 2019-11-23 ENCOUNTER — Telehealth (INDEPENDENT_AMBULATORY_CARE_PROVIDER_SITE_OTHER): Payer: Medicare Other | Admitting: Family Medicine

## 2019-11-23 ENCOUNTER — Ambulatory Visit: Payer: Medicare Other | Admitting: Cardiovascular Disease

## 2019-11-23 VITALS — BP 192/89

## 2019-11-23 DIAGNOSIS — G4700 Insomnia, unspecified: Secondary | ICD-10-CM | POA: Diagnosis not present

## 2019-11-23 DIAGNOSIS — Z79899 Other long term (current) drug therapy: Secondary | ICD-10-CM | POA: Diagnosis not present

## 2019-11-23 DIAGNOSIS — R0602 Shortness of breath: Secondary | ICD-10-CM

## 2019-11-23 DIAGNOSIS — E876 Hypokalemia: Secondary | ICD-10-CM | POA: Diagnosis not present

## 2019-11-23 DIAGNOSIS — R197 Diarrhea, unspecified: Secondary | ICD-10-CM

## 2019-11-23 DIAGNOSIS — I10 Essential (primary) hypertension: Secondary | ICD-10-CM

## 2019-11-23 DIAGNOSIS — Z8616 Personal history of COVID-19: Secondary | ICD-10-CM

## 2019-11-23 MED ORDER — AMLODIPINE BESYLATE 5 MG PO TABS: 5.0000 mg | ORAL_TABLET | Freq: Every day | ORAL | 3 refills | Status: DC

## 2019-11-23 NOTE — Progress Notes (Signed)
Virtual Visit via Video Note  I connected with Mia [name] on 11/24/19 at  2:00 PM EST by a video enabled telemedicine application and verified that I am speaking with the correct person using two identifiers.   I discussed the limitations of evaluation and management by telemedicine and the availability of in person appointments. The patient expressed understanding and agreed to proceed.  Subjective:    CC: Concerns about high blood pressure and follow-up ED visit from [facility].  HPI: Follow-up today for recent emergency department visit at [facility] on January 21.  She was diagnosed with Covid on January 9 and was actually admitted for that.  She was discharged home but she started having acute onset of diarrhea.  She was reporting 10+ episodes per night of watery nonbloody stools.  She started to feel faint and dizzy. Her potassium was low.   Since then she has been having problems with her blood pressure going extremely high with systolics in the [id number].  He has consulted with cardiology and they have recommended increasing her losartan as well as recently starting amlodipine. She is on metoprolol 150 mg BID.  She took losartan 100mg  total yesterday.  BP this morning 199/87.   Says the Diarrhea and vomiting is better.  Diarrhea is better.    + SOB with activity since had COVID. No cough.  Still has some nasal congestion.    Not sleeping well at all. Really hard time falling asleep. Can take hours trazodone not helping and melatonin not helping.   Past medical history, Surgical history, Family history not pertinant except as noted below, Social history, Allergies, and medications have been entered into the medical record, reviewed, and corrections made.   Review of Systems: No fevers, chills, night sweats, weight loss, chest pain, or shortness of breath.   Objective:    General: Speaking clearly in complete sentences without any shortness of breath.  Alert and oriented x3.  Normal  judgment. No apparent acute distress.    Impression and Recommendations:    HTN- Not well controlled. Encouraged her to pick up the amlodipine ASAP and start now instead of waiting until tonight. Increase losartan to 100 mg and continue the metoprolol.    SOB - likely secondary to COVID.  Maybe also be from high BP.  Continue to walk.   Diarrhea - still having some mild diarrhea but better overall.  Still hold diuretic.  Ok to use  Imodium PRN. Try to eat foods like rice.   Hypokalemia - taking once potassium a day a day. Still has a week worth . Will need to check BMP at that time.    Insomnia - OK to increase trazodone to 100mg .  Since the 50 mg does not seem to be very effective.   I discussed the assessment and treatment plan with the patient. The patient was provided an opportunity to ask questions and all were answered. The patient agreed with the plan and demonstrated an understanding of the instructions.   The patient was advised to call back or seek an in-person evaluation if the symptoms worsen or if the condition fails to improve as anticipated.  Time spent 25 minutes in encounter.  [name], MD

## 2019-11-23 NOTE — Progress Notes (Signed)
Pt reports that her bp has been running over 200 and cardiology advised that she take Losartan 150 mg BID if her BP is 160 or above..they also d/c'd the Furosemide and started her on Amlodipine 5 mg.   She states that she has SOB with exertion and was told that this is due to her BP being elevated.

## 2019-11-23 NOTE — Telephone Encounter (Signed)
Called patient's home number and spoke with her daughter. She states her mother's BP remains elevated at 201/86 mmHg and 200/120 mmHg yesterday. She reports BP has been elevated on each occasion except Tuesday morning when it was 138/79 mmHg. She states patient has h/a and SOB when BP is elevated. States they are being careful to follow low sodium diet and good hydration and reports BP cuff has been validated by checking her own and her father's BP readings which have been WNL. Patient has taken 1 additional dose of losartan 50 mg yesterday; BP only improved slightly. At the time of the call daughter had not checked BP because medications were only give 1 hour prior. I advised her that Dr. Acie Fredrickson would like to add amlodipine 5 mg daily. I advised her to give patient losartan 50 mg, amlodipine 5 mg, and metoprolol 150 mg each morning for BP. I advised that if BP remains elevated in the evening patient may take additional losartan 50 mg. I advised her not to exceed 100 mg of losartan in 24 hours. I advised I will call back tomorrow afternoon to assess patient's BP and if needed, we will reschedule patient's appointment to next week from Feb. 9. I advised daughter to call back with questions or concerns at any time over the next few days. She verbalized understanding and agreement with plan and thanked me for the call.

## 2019-11-24 ENCOUNTER — Encounter: Payer: Self-pay | Admitting: Family Medicine

## 2019-11-24 NOTE — Telephone Encounter (Signed)
Called patient to check on her BP since yesterday Daughter reports BP remained high yesterday and patient had virtual visit with Dr. Madilyn Fireman, PCP. She advised patient to go get the amlodipine to start last night, which patient did. BP improved from 218/95 to 189/79 last night after first dose. Patient has consistent h/a and SOB with elevated BP.  This morning, patient took metoprolol 150 mg, amlodipine 5 mg and losartan 50 mg and 2 hours later her BP was 155/76. It is currently 163/82. I advised patient's daughter that if systolic BP remains 0000000 mmHg, that she may take losartan 50 mg again tonight for a maximum daily dose of 100 mg. I asked daughter if she misunderstood yesterday when I advised her of the max dose of losartan due to Dr. Gardiner Ramus note that we advised patient to take losartan 150 mg. Daughter states it must have been a mistype by Dr. Madilyn Fireman because she told her the information that she had been given.  I scheduled patient to see Pecolia Ades, NP on Monday. She has an appointment with Dr. Acie Fredrickson on 2/9 but needs sooner evaluation. Patient had a positive Covid test prior to hospital admission on 1/12 and has quarantined since 1/16 except for a return trip to the hospital on 1/21. Daughter verbalized understanding and agreement with plan and thanked me for the call.

## 2019-11-24 NOTE — Telephone Encounter (Signed)
I agree with plan and note by Christen Bame, RN

## 2019-11-26 DIAGNOSIS — R5383 Other fatigue: Secondary | ICD-10-CM | POA: Diagnosis not present

## 2019-11-26 DIAGNOSIS — Z20828 Contact with and (suspected) exposure to other viral communicable diseases: Secondary | ICD-10-CM | POA: Diagnosis not present

## 2019-11-26 DIAGNOSIS — Z03818 Encounter for observation for suspected exposure to other biological agents ruled out: Secondary | ICD-10-CM | POA: Diagnosis not present

## 2019-11-27 ENCOUNTER — Other Ambulatory Visit: Payer: Self-pay

## 2019-11-27 ENCOUNTER — Ambulatory Visit (INDEPENDENT_AMBULATORY_CARE_PROVIDER_SITE_OTHER): Payer: Medicare Other | Admitting: Cardiology

## 2019-11-27 ENCOUNTER — Encounter: Payer: Self-pay | Admitting: Cardiology

## 2019-11-27 VITALS — BP 108/68 | HR 70 | Ht 66.0 in | Wt 178.0 lb

## 2019-11-27 DIAGNOSIS — E876 Hypokalemia: Secondary | ICD-10-CM | POA: Diagnosis not present

## 2019-11-27 DIAGNOSIS — R5383 Other fatigue: Secondary | ICD-10-CM

## 2019-11-27 DIAGNOSIS — I10 Essential (primary) hypertension: Secondary | ICD-10-CM | POA: Diagnosis not present

## 2019-11-27 MED ORDER — LOSARTAN POTASSIUM 50 MG PO TABS: 50.0000 mg | ORAL_TABLET | Freq: Every day | ORAL | 3 refills | Status: DC

## 2019-11-27 NOTE — Patient Instructions (Signed)
Medication Instructions:  Your physician recommends that you continue on your current medications as directed. Please refer to the Current Medication list given to you today.   *If you need a refill on your cardiac medications before your next appointment, please call your pharmacy*  Lab Work: Bmet - Today  If you have labs (blood work) drawn today and your tests are completely normal, you will receive your results only by: Marland Kitchen MyChart Message (if you have MyChart) OR . A paper copy in the mail If you have any lab test that is abnormal or we need to change your treatment, we will call you to review the results.  Testing/Procedures: None ordered   Follow-Up: You are scheduled to see Mertie Moores, MD on 12/05/2019 @ 7:40 AM  Other Instructions None

## 2019-11-27 NOTE — Progress Notes (Signed)
Cardiology Office Note:    Date:  11/27/2019   ID:  Mia [name], DOB 06-26-1951, MRN [id number]  PCP:  Hali Marry, MD  Cardiologist:  Mertie Moores, MD  Referring MD: Hali Marry, *   Chief Complaint  Patient presents with  . Hypertension    History of Present Illness:    Mia [name] is a 69 y.o. female with a past medical history significant for prediabetes, fibromyalgia, hyperlipidemia, Rheumatoid arthritis, Raynaud's disease, OSA on CPAP and SVT. Mia [name] had positive Covid 19 test on 11/07/19 with symptoms of diarrhea, vomiting, inability to tolerate oral intake and fatigue. Mia [name] was in [place] for 6 days.   Mia [name] is being seen today for elevated BP.  In response to a phone call into the office per the patient's daughter on 1/27 with BP up to 201/120, Dr. Acie Fredrickson had added amlodipine 5 mg daily. Her BP had not come down significantly with an extra dose of losartan 50 mg. Mia [name] was reporting a consistent headache and shortness of breath with the elevated BP. The patient's daughter was advised not to exceed 100 mg losartan.   Mia [name] was last seen by virtual visit on 03/06/2019 by Dr. Acie Fredrickson who noted that Mia [name] was having occ low BP with occ episodes of orthostasis. Lasix and KDur were put on hold.   BP at [facility] yesterday was 106/73, HR 71 BP at ED visit on 11/16/2019 was 223/88  Today Mia [name] is seen with her daughter also on speaker phone from the parking lot. BP is very good in the office. Mia [name] reports that her BP was as high as 220's. Her home BP log shows BP's 140's-170's. Mia [name] had a low BP yesterday taken after being out and then walking around in the house. Her daughter says that the patient has been very sedentary while on Covid quarantine. Today Mia [name] feels like Mia [name] is "in slow motion". Mia [name] feels short of breath walking in from the parking lot. And Mia [name] was exhausted after taking her shower this morning.   Mia [name] has OSA on CPAP, has only used CPAP ~2 nights since home from  the hospital. Mia [name] has been having trouble sleeping. Trazadone has been prescitbed but it has not been helping much. Mia [name] is now taking 2 Tylenol PMs with trazadone. Mia [name] has had no energy to do her daily activities, just since having Covid 19. Mia [name] notes that Mia [name] had chest pressure when in the hospital Covid, but none since. Mia [name] has a mild rash, few ring like lesions on her chest and back.  Her vomiting and diarrhea have improved. No recent vomiting. Mia [name] did have some loose stools yesterday but none today.  Mia [name] is drinking gatorade and water, Coke zero. Mia [name] is trying to keep her fluid intake up. Mia [name] feels like her whole body is "out of sorts".   Mia [name] has no orthpnea, PND, edema.   Cardiac studies   No recent testing  Past Medical History:  Diagnosis Date  . Anal fissure   . Anemia   . Arthritis   . Blood transfusion   . C. difficile colitis   . Chest pain    a. 01/2013 MV: EF 59%, no ischemia.  . Chronic Dyspnea    a. 01/2013 Echo: EF 60-65%, Gr 1 DD, PASP 1mmHg.  Marland Kitchen COVID-19   . Diabetes mellitus   . Fibromyalgia   . Gall stones   . GERD (gastroesophageal reflux disease)    gastritis  . Hemorrhoids   .  Hypertension   . Interstitial cystitis   . MCI (mild cognitive impairment) 11/25/2017  . Memory difficulty 12/14/2013  . Neuromuscular disorder ([place])    sclerosis  . Osteoporosis   . Peptic ulcer   . PONV (postoperative nausea and vomiting)   . Pseudogout   . Raynaud phenomenon   . Rectal bleeding   . Sleep apnea    a. on cpap.  Marland Kitchen SVT (supraventricular tachycardia) ([place])   . Syncope 09/10/2015  . Thyroid disease    hypothyroidism  . Tremor, essential 05/14/2016    Past Surgical History:  Procedure Laterality Date  . APPENDECTOMY    . BLADDER SURGERY     x2  . BLADDER SURGERY    . BREAST BIOPSY    . CARDIAC CATHETERIZATION    . CARPAL TUNNEL RELEASE    . CATARACT EXTRACTION Bilateral   . CHOLECYSTECTOMY    . DILATION AND CURETTAGE OF UTERUS    . ENTEROCELE REPAIR      x2  . EYE SURGERY     retina  . hysterectomy - unknown type    . JOINT REPLACEMENT    . KNEE ARTHROPLASTY    . KNEE SURGERY     x6  . NECK SURGERY     fusion  . OOPHORECTOMY    . QUADRICEPS REPAIR Right   . RECTOCELE REPAIR     x2  . SHOULDER ARTHROSCOPY WITH SUBACROMIAL DECOMPRESSION, ROTATOR CUFF REPAIR AND BICEP TENDON REPAIR  10/06/2012   Procedure: SHOULDER ARTHROSCOPY WITH SUBACROMIAL DECOMPRESSION, ROTATOR CUFF REPAIR AND BICEP TENDON REPAIR;  Surgeon: Nita Sells, MD;  Location: [facility];  Service: Orthopedics;  Laterality: Right;  Arthroscopic  Repair  of  Subscapularis, Open Biceps Tenodesis  . SHOULDER SURGERY     bilateral- bones spur  . TONSILLECTOMY    . TOTAL KNEE ARTHROPLASTY    . TOTAL SHOULDER ARTHROPLASTY      Current Medications: Current Meds  Medication Sig  . AMBULATORY NON FORMULARY MEDICATION Medication Name: One touch ultra strips. Check fasting blood sugar in the morning and as needed.  Dx - [id number] Fax to [phone]  . amLODipine (NORVASC) 5 MG tablet Take 1 tablet (5 mg total) by mouth daily.  Marland Kitchen atorvastatin (LIPITOR) 40 MG tablet TAKE 1 TABLET EVERY DAY  . Cyanocobalamin (VITAMIN B-12 IJ) Inject 1 mL as directed every 30 (thirty) days.   Marland Kitchen denosumab (PROLIA) 60 MG/ML SOLN injection Inject 60 mg into the skin every 6 (six) months. Administer in upper arm, thigh, or abdomen  . donepezil (ARICEPT) 10 MG tablet TAKE 1 TABLET BY MOUTH AT BEDTIME  . fluticasone (FLONASE) 50 MCG/ACT nasal spray Place 2 sprays into both nostrils daily.  Marland Kitchen gabapentin (NEURONTIN) 300 MG capsule TAKE 1 TO 2 CAPSULES BY MOUTH UP TO TWO TIMES DAILY AS NEEDED  . losartan (COZAAR) 50 MG tablet Take 1 tablet (50 mg total) by mouth daily. Take 1 tablet in the morning and 1 tablet in evening as needed for blood pressure greater than 150  . Melatonin 5 MG TABS Take 5 mg by mouth at bedtime.   . memantine (NAMENDA) 10 MG tablet TAKE 1 TABLET TWICE DAILY  .  metFORMIN (GLUCOPHAGE-XR) 500 MG 24 hr tablet TAKE 2 TABLETS EVERY DAY WITH BREAKFAST  . metoprolol tartrate (LOPRESSOR) 100 MG tablet TAKE 1 AND 1/2 TABLETS TWICE DAILY  . NON FORMULARY Place into the nose at bedtime. cpap machine  . ondansetron (ZOFRAN) 4 MG tablet Take  1 tablet (4 mg total) by mouth daily as needed for nausea or vomiting.  . ONE TOUCH ULTRA TEST test strip USE TO TEST FASTING BLOOD SUGAR IN THE MORNING AND AS NEEDED  . pantoprazole (PROTONIX) 40 MG tablet Take 40 mg by mouth 2 (two) times daily.  . polyethylene glycol powder (MIRALAX) powder Take 17 g by mouth daily as needed for mild constipation or moderate constipation.   . potassium chloride SA (KLOR-CON) 20 MEQ tablet Take 1 tablet (20 mEq total) by mouth daily.  . primidone (MYSOLINE) 50 MG tablet TAKE 1 TABLET AT BEDTIME  . promethazine (PHENERGAN) 25 MG suppository Place 1 suppository (25 mg total) rectally every 6 (six) hours as needed for nausea.  . traZODone (DESYREL) 50 MG tablet Take 0.5-1 tablets (25-50 mg total) by mouth at bedtime.  Marland Kitchen venlafaxine XR (EFFEXOR-XR) 75 MG 24 hr capsule TAKE 1 CAPSULE EVERY DAY WITH BREAKFAST  . [DISCONTINUED] losartan (COZAAR) 50 MG tablet TAKE 1 TABLET EVERY DAY     Allergies:   Codeine, Myrbetriq  [mirabegron], Percocet [oxycodone-acetaminophen], Celebrex [celecoxib], Darvocet [propoxyphene n-acetaminophen], Erythromycin, Hydrocodone, Lyrica [pregabalin], Macrodantin [nitrofurantoin], Toradol [ketorolac tromethamine], Tramadol, and Verapamil   Social History   Socioeconomic History  . Marital status: Married    Spouse name: Not on file  . Number of children: 2  . Years of education: 53  . Highest education level: Not on file  Occupational History  . Occupation: Retired  Tobacco Use  . Smoking status: Never Smoker  . Smokeless tobacco: Never Used  Substance and Sexual Activity  . Alcohol use: No    Alcohol/week: 0.0 standard drinks  . Drug use: No  . Sexual activity:  Not on file  Other Topics Concern  . Not on file  Social History Narrative   Patient drinks about 3-4 cups of caffeine daily.   Patient is right handed.   Social Determinants of Health   Financial Resource Strain:   . Difficulty of Paying Living Expenses: Not on file  Food Insecurity:   . Worried About Charity fundraiser in the Last Year: Not on file  . Ran Out of Food in the Last Year: Not on file  Transportation Needs:   . Lack of Transportation (Medical): Not on file  . Lack of Transportation (Non-Medical): Not on file  Physical Activity:   . Days of Exercise per Week: Not on file  . Minutes of Exercise per Session: Not on file  Stress:   . Feeling of Stress : Not on file  Social Connections:   . Frequency of Communication with Friends and Family: Not on file  . Frequency of Social Gatherings with Friends and Family: Not on file  . Attends Religious Services: Not on file  . Active Member of Clubs or Organizations: Not on file  . Attends Archivist Meetings: Not on file  . Marital Status: Not on file     Family History: The patient's family history includes Asthma in her father, mother, sister, and sister; Breast cancer in her maternal grandmother; Colon polyps in her mother; Dementia in her sister; Diabetes in her mother; Heart attack in her father and mother; Heart disease in her father and mother; Hypertension in her sister; Lupus in her sister; Pancreatic cancer in her maternal grandfather; Rheum arthritis in her sister; Stroke in her mother and sister; Wilson's disease in her maternal grandmother. ROS:   Please see the history of present illness.     All other systems  reviewed and are negative.   EKG:  EKG is not ordered today.   Recent Labs: 09/12/2019: TSH 1.43 11/06/2019: Magnesium 2.1 11/16/2019: ALT 22; BUN 6; Creatinine, Ser 0.66; Hemoglobin 12.4; Platelets 377; Potassium 3.0; Sodium 141   Recent Lipid Panel    Component Value Date/Time   CHOL 128  06/07/2019 0934   TRIG 97 11/07/2019 0004   HDL 52 06/07/2019 0934   CHOLHDL 2.5 06/07/2019 0934   CHOLHDL 3.2 08/28/2016 1558   VLDL 22 08/28/2016 1558   LDLCALC 51 06/07/2019 0934    Physical Exam:    VS:  BP 108/68   Pulse 70   Ht 5\' 6"  (1.676 m)   Wt 178 lb (80.7 kg)   SpO2 96%   BMI 28.73 kg/m     Wt Readings from Last 6 Encounters:  11/27/19 178 lb (80.7 kg)  11/07/19 195 lb 5.2 oz (88.6 kg)  10/12/19 186 lb (84.4 kg)  09/12/19 186 lb (84.4 kg)  08/14/19 183 lb (83 kg)  07/13/19 177 lb (80.3 kg)     Physical Exam  Constitutional: Mia [name] is oriented to person, place, and time. Mia [name] appears well-developed and well-nourished. No distress.  HENT:  Head: Normocephalic and atraumatic.  Neck: No JVD present.  Cardiovascular: Normal rate, regular rhythm, normal heart sounds and intact distal pulses. Exam reveals no gallop and no friction rub.  No murmur heard. Pulmonary/Chest: Effort normal and breath sounds normal. No respiratory distress. Mia [name] has no wheezes. Mia [name] has no rales.  Abdominal: Soft. Bowel sounds are normal.  Musculoskeletal:        General: No edema. Normal range of motion.     Cervical back: Normal range of motion and neck supple.  Neurological: Mia [name] is alert and oriented to person, place, and time.  Skin: Skin is warm and dry.  Psychiatric: Mia [name] has a normal mood and affect. Her behavior is normal. Judgment and thought content normal.  Vitals reviewed.   ASSESSMENT:    1. Essential (primary) hypertension   2. Hypokalemia   3. Other fatigue    PLAN:    In order of problems listed above:  Hypertension -Mia [name] on losartan 50 mg daily, lopressor 150 mg BID. Amlodipine 5 mg added per phone call on 11/22/19 for uncontrolled hypertension.  Mia [name] has been taking an extra losartan dose in the evening if BP is running high. Today her BP is very good her at 108/68. It was higher at home this morning at 158 prior to meds.  -BP seems to be better since addition of  amlodipine. Will continue current regimen.  -I had a long discussion on blood pressure, medications to treat. We also discussed possible hypotension, symptoms and drinking water if BP is low.  -I advised that Mia [name] does not have to take her Bp several times per day as this likely makes her more anxious.  -Mia [name] will follow up with Dr. Acie Fredrickson.   Extreme fatigue/post Covid 19 -Mia [name] was very sedentary in hospital for 6 days then on COVID quarantine in one room at home. Mia [name] is now very fatigued, unable to sleep well, activity intolerant. No orthpnea, edema or other heart failure type symptoms.  -Could consider cardiac testing- echo/stress test if her symptoms persist after full recovery from COVID 19.   Hypokalemia -Mia [name] had hypokalemia in the hospital with Covid 19 likely related to vomiting and diarrhea. With her taking extra losartan as needed when BP elevated, I check BMet today for renal function and potassium.  Medication Adjustments/Labs and Tests Ordered: Current medicines are reviewed at length with the patient today.  Concerns regarding medicines are outlined above. Labs and tests ordered and medication changes are outlined in the patient instructions below:  Patient Instructions  Medication Instructions:  Your physician recommends that you continue on your current medications as directed. Please refer to the Current Medication list given to you today.   *If you need a refill on your cardiac medications before your next appointment, please call your pharmacy*  Lab Work: Bmet - Today  If you have labs (blood work) drawn today and your tests are completely normal, you will receive your results only by: Marland Kitchen MyChart Message (if you have MyChart) OR . A paper copy in the mail If you have any lab test that is abnormal or we need to change your treatment, we will call you to review the results.  Testing/Procedures: None ordered   Follow-Up: You are scheduled to see Mertie Moores, MD on  12/05/2019 @ 7:40 AM  Other Instructions None      Signed, Daune Perch, NP  11/27/2019 4:33 PM    [facility]

## 2019-11-28 ENCOUNTER — Telehealth: Payer: Self-pay | Admitting: Rheumatology

## 2019-11-28 LAB — BASIC METABOLIC PANEL
BUN/Creatinine Ratio: 13 (ref 12–28)
BUN: 10 mg/dL (ref 8–27)
CO2: 24 mmol/L (ref 20–29)
Calcium: 9.9 mg/dL (ref 8.7–10.3)
Chloride: 101 mmol/L (ref 96–106)
Creatinine, Ser: 0.8 mg/dL (ref 0.57–1.00)
GFR calc Af Amer: 88 mL/min/{1.73_m2} (ref >59)
GFR calc non Af Amer: 76 mL/min/{1.73_m2} (ref >59)
Glucose: 100 mg/dL — ABNORMAL HIGH (ref 65–99)
Potassium: 5.1 mmol/L (ref 3.5–5.2)
Sodium: 138 mmol/L (ref 134–144)

## 2019-11-28 NOTE — Telephone Encounter (Signed)
Patient states she was diagnosed with Covid on 11/04/19. Patient states she stopped Arava due to this. Patient states she is having diarrhea and joint pain. Patient states she is having trouble with sleeping as well. Patient has a prescription for that. Patient would like to know if she can restart her [place].

## 2019-11-28 NOTE — Telephone Encounter (Signed)
Patient requesting a call back in reference to having COVID, and discontinuing Arava. Per patient, she is having pain with back. Patient is having trouble straightening up when getting up to walk. Patient wants to go back on [place] if possible. Patient was diagnosised with COVID 11/04/19. Please call to advise.

## 2019-11-28 NOTE — Telephone Encounter (Signed)
Please advise patient to follow up with PCP regarding the etiology of diarrhea. If her PCP feels the diarrhea she is experiencing is related to covid-19 or another infectious cause she should not restart on Arava. She should not restart arava until all of her symptoms have resolved.

## 2019-11-28 NOTE — Telephone Encounter (Signed)
Patient advised to follow up with PCP regarding the etiology of diarrhea. If her PCP feels the diarrhea she is experiencing is related to covid-19 or another infectious cause she should not restart on Arava. She should not restart arava until all of her symptoms have resolved. Patient verbalized understanding.

## 2019-11-29 DIAGNOSIS — M25512 Pain in left shoulder: Secondary | ICD-10-CM | POA: Diagnosis not present

## 2019-11-29 DIAGNOSIS — M542 Cervicalgia: Secondary | ICD-10-CM | POA: Diagnosis not present

## 2019-12-01 ENCOUNTER — Ambulatory Visit (INDEPENDENT_AMBULATORY_CARE_PROVIDER_SITE_OTHER): Payer: Medicare Other | Admitting: Nurse Practitioner

## 2019-12-01 ENCOUNTER — Encounter: Payer: Self-pay | Admitting: Nurse Practitioner

## 2019-12-01 ENCOUNTER — Telehealth: Payer: Self-pay

## 2019-12-01 VITALS — BP 144/83 | HR 71

## 2019-12-01 DIAGNOSIS — G4700 Insomnia, unspecified: Secondary | ICD-10-CM | POA: Diagnosis not present

## 2019-12-01 MED ORDER — TRAZODONE HCL 100 MG PO TABS: 100.0000 mg | ORAL_TABLET | Freq: Every day | ORAL | 1 refills | Status: DC

## 2019-12-01 NOTE — Telephone Encounter (Signed)
-----   Message from Daune Perch, NP sent at 11/28/2019  8:21 AM EST ----- Kidney function is normal. Potassium level is now at the top of normal. Would decrease the potassium supplementation to 29mEq. Follow up BMet in 1 month.  Daune Perch, NP

## 2019-12-01 NOTE — Patient Instructions (Signed)

## 2019-12-01 NOTE — Telephone Encounter (Addendum)
Pt states that she is no longer taking the potassium and does not have plans to restart it. Pt has appointment to see Dr. Acie Fredrickson on 12/05/2019

## 2019-12-01 NOTE — Progress Notes (Signed)
rtual Visit via Video Note  I connected with Mia [name] on 12/01/19 at  4:20 PM EST by the video enabled telemedicine application, MyChart, and verified that I am speaking with the correct person using two identifiers.   I introduced myself as a Designer, jewellery with the practice. We discussed the limitations of evaluation and management by telemedicine and the availability of in person appointments. The patient expressed understanding and agreed to proceed.  The patient is: at home  I am: in the office   Subjective:    CC: difficulty sleeping   HPI: Mia [name] is a 69 y.o. y/o female presenting via [place] today for an insomnia that she has been experiencing for the past several weeks.  She reports she was hospitalized in [place] January with COVID-19 and since being discharged from the hospital she is found it incredibly difficult to sleep at night.  She reports she has difficulty with both falling asleep and staying asleep.  She reports her primary care provider Dr. Charise Carwin had started her on trazodone 25 to 50 mg/day to help with sleep which was increased to 100 mg last week.  She reports this is still not helping.  She reports that 2 nights ago she took an all natural sleep supplement containing melatonin, chamomile, and lavender in addition to 100 mg of trazodone and to Tylenol PM and that allowed her to get approximately 4 hours of sleep. She does reports she has a history of neck pain however, she had a cortisone injection 2 days ago and was started on a prednisone taper pack at that time as well and she reports her pain has resolved.  She reports good sleep hygiene and she denies problems with sleeping prior to being hospitalized for Covid.  Of note, she reports she was given Ambien while in the hospital and it did not help with her sleep either.  INSOMNIA Duration: couple weeks Satisfied with sleep quality: no Difficulty falling asleep: yes Difficulty staying asleep: yes Waking a  few hours after sleep onset: yes Kathrin Folden morning awakenings: yes Daytime hypersomnolence: no Wakes feeling refreshed: no Good sleep hygiene: yes Apnea: yes Snoring: yes Depressed/anxious mood: no Recent stress: yes Restless legs/nocturnal leg cramps: no Chronic pain/arthritis: yes History of sleep study: yes Treatments attempted: wears CPAP at night    Past medical history, Surgical history, Family history not pertinant except as noted below, Social history, Allergies, and medications have been entered into the medical record, reviewed, and corrections made.   Review of Systems:  General: No fevers, chills, fatigue, night sweats, weight loss.     Objective:    General: Speaking clearly in complete sentences without any shortness of breath.  Alert and oriented x3.  Normal judgment. No apparent acute distress.   Impression and Recommendations:    1. Insomnia, unspecified type Patient continues to experience insomnia after hospitalization from COVID-19 despite addition of trazodone.  An increase in her trazodone dosing to 100 mg has not been effective to helping with her sleep.  Discussed proper sleep hygiene measures with the patient. Discussed with the patient attempting 100 to 150 mg of trazodone (1-1.5 tab) at night to see if this helps with sleep.  The current addition of steroids could be exacerbating some of the symptoms however, her symptoms were present prior to their initiation.  The patient does use a CPAP at night. Discussed with the patient to try the new medication dose over the weekend and if she does not see any improvement  in symptoms by [facility] Ingham next week to touch base with Dr. Charise Carwin or myself and we will reevaluate.  May consider hydroxyzine if increased trazodone dose fails.  - traZODone (DESYREL) 100 MG tablet; Take 1-1.5 tablets (100-150 mg total) by mouth at bedtime.  Dispense: 45 tablet; Refill: 1   I discussed the assessment and treatment plan with the patient.  The patient was provided an opportunity to ask questions and all were answered. The patient agreed with the plan and demonstrated an understanding of the instructions.   The patient was advised to call back or seek an in-person evaluation if the symptoms worsen or if the condition fails to improve as anticipated.  40 minutes spent with chart review, paperwork, and patient visit.  Orma Render, NP

## 2019-12-04 NOTE — Progress Notes (Signed)
Cardiology Office Note   Date:  12/05/2019   ID:  Mia [name], DOB April 24, 1951, MRN [id number]  PCP:  Hali Marry, MD  Cardiologist:   Mertie Moores, MD   Chief Complaint  Patient presents with  . Hypotension   1. SVT 2. Raynaud's Phenomenon 3. Diabetes Mellitus 4. Minimal CAD by cath 2006 [name]) 5. Sleep apnea - CPAP is currently not working  02/11/05 Right heart catheterization:  RA pressure: 8 mmHg  RV pressure: 25/7 mmHg  PA pressure: 20/9 mmHg  PWCP: 9 mmHg  CO: 4.6 liters per minute  LEFT HEART CATHETERIZATION RESULTS:  1. Left main: No significant disease.  2. LAD: Mild luminal irregularities.  3. First and second diagonal: Moderate size, mild luminal irregularities.  4. Left circumflex: Nondominant, mild luminal irregularities.  5. RCA: Dominant with mild luminal irregularities.  6. LV: EF is 55 to 60%, no wall motion abnormalities. LV EDP was 9 mmHg.   December 28, 2014:  Mia [name] is a 69 y.o. female who presents for follow-up with an episode of chest pain. She was seen by our nurse practitioner, Ignacia Bayley.    Diltiazem was DC'd.   She was started on Metoprolol 50 bid.   Myoview study was normal. She has normal left ventricle systolic function. She is going to the [facility] 3 times a week.  Doing well.     She is feeling much better.    Sept. 1, 2016:  Has had C diff for the past several months .  Doing well from a cardiac standpoint .  Has been fatigued.  Doing well with the metoprolol  Still has shortness of breath .   Nov. 3, 2017:    Mia [name] is seen today with her husband, [name].   Last week she started having some heart facing .  HR was 187 (using the app on her phone )  Could feel it in her ears. Got a little dizzy.  Sat down and relaxed and the episode resolved.  Had another episode this past Sunday .  HR stayed elevated for 2 hours   Aug. 24, 2018: Mia [name] is seen today with husband [name].   Followed for HTN and  hyperlipidemia  Has had some elevated diastolic BP readings . Eats some salty foods still - likes ham. Complains of leg pain / ache when she is standing .    Nov. 16, 2018  Mia [name] was having more SVT, we have increased the metoprolol  Up to metoprolol 100 mg BID. She is much better controlled at this point Wearing the event monitor   December 08, 2017:  Mia [name] is seen today for follow-up of her supraventricular tachycardia  We had increased her metoprolol to 100 mg BID  Has occasional palpitations  No CP ,  No severe dyspnea. Does has some DOE that is likely due to generalized deconditioning  Needs left knee replacement this summer. Needs carple tunnel surgery in both wrists.  Has episodes of diaphoresis   Mar 11, 2018: Mia [name] is seen today for follow-up of her supraventricular tachycardia.  We increased her metoprolol up to 150 mill grams twice a day during her last visit. Tolerating the metoprolol well  BP is still is elevated at times.     Eats lots of sandwiches, salami,  Her diet is "terrible"  According to her . - eats snacks instead of real meals, sandwiches,   She is surprised that she hasnt lost any weight .   Nov.  4,, 2019:  Mia [name] is here follow-up with her supraventricular tachycardia and hypertension. Palpitations have improved . BP is still elevated.    Avoids salt for the most part  Still eats Nabs crackers regularly   Feb. 9. 2021  Mia [name] is seen today for follow-up visit. She has a history of supraventricular tachycardia.  She has a history of sleep apnea and is on CPAP. She tested positive for Covid on November 07, 2019.  She had symptoms of diarrhea, nausea, vomiting and inability to take p.o. food.  She was in [facility] for 6 days.  BP was elevated in the hospital  Was started on amlodipine.   Doubled her Losartan ( was to take at night)  She was seen by Pecolia Ades, NP on February 1  When she saw [name] last week she was having problems with  hypertension.  Amlodipine had been added. She continued to have lots of fatigue.  BP is  Is eating and drinking normally    Past Medical History:  Diagnosis Date  . Anal fissure   . Anemia   . Arthritis   . Blood transfusion   . C. difficile colitis   . Chest pain    a. 01/2013 MV: EF 59%, no ischemia.  . Chronic Dyspnea    a. 01/2013 Echo: EF 60-65%, Gr 1 DD, PASP 76mmHg.  Marland Kitchen COVID-19   . Diabetes mellitus   . Fibromyalgia   . Gall stones   . GERD (gastroesophageal reflux disease)    gastritis  . Hemorrhoids   . Hypertension   . Interstitial cystitis   . MCI (mild cognitive impairment) 11/25/2017  . Memory difficulty 12/14/2013  . Neuromuscular disorder ([place])    sclerosis  . Osteoporosis   . Peptic ulcer   . PONV (postoperative nausea and vomiting)   . Pseudogout   . Raynaud phenomenon   . Rectal bleeding   . Sleep apnea    a. on cpap.  Marland Kitchen SVT (supraventricular tachycardia) ([place])   . Syncope 09/10/2015  . Thyroid disease    hypothyroidism  . Tremor, essential 05/14/2016    Past Surgical History:  Procedure Laterality Date  . APPENDECTOMY    . BLADDER SURGERY     x2  . BLADDER SURGERY    . BREAST BIOPSY    . CARDIAC CATHETERIZATION    . CARPAL TUNNEL RELEASE    . CATARACT EXTRACTION Bilateral   . CHOLECYSTECTOMY    . DILATION AND CURETTAGE OF UTERUS    . ENTEROCELE REPAIR     x2  . EYE SURGERY     retina  . hysterectomy - unknown type    . JOINT REPLACEMENT    . KNEE ARTHROPLASTY    . KNEE SURGERY     x6  . NECK SURGERY     fusion  . OOPHORECTOMY    . QUADRICEPS REPAIR Right   . RECTOCELE REPAIR     x2  . SHOULDER ARTHROSCOPY WITH SUBACROMIAL DECOMPRESSION, ROTATOR CUFF REPAIR AND BICEP TENDON REPAIR  10/06/2012   Procedure: SHOULDER ARTHROSCOPY WITH SUBACROMIAL DECOMPRESSION, ROTATOR CUFF REPAIR AND BICEP TENDON REPAIR;  Surgeon: Nita Sells, MD;  Location: [facility];  Service: Orthopedics;  Laterality: Right;   Arthroscopic  Repair  of  Subscapularis, Open Biceps Tenodesis  . SHOULDER SURGERY     bilateral- bones spur  . TONSILLECTOMY    . TOTAL KNEE ARTHROPLASTY    . TOTAL SHOULDER ARTHROPLASTY  Current Outpatient Medications  Medication Sig Dispense Refill  . AMBULATORY NON FORMULARY MEDICATION Medication Name: One touch ultra strips. Check fasting blood sugar in the morning and as needed.  Dx - [id number] Fax to [phone] 50 strip 11  . amLODipine (NORVASC) 5 MG tablet Take 1 tablet (5 mg total) by mouth daily. 90 tablet 3  . atorvastatin (LIPITOR) 40 MG tablet TAKE 1 TABLET EVERY DAY 90 tablet 2  . Cyanocobalamin (VITAMIN B-12 IJ) Inject 1 mL as directed every 30 (thirty) days.     Marland Kitchen denosumab (PROLIA) 60 MG/ML SOLN injection Inject 60 mg into the skin every 6 (six) months. Administer in upper arm, thigh, or abdomen    . donepezil (ARICEPT) 10 MG tablet TAKE 1 TABLET BY MOUTH AT BEDTIME 90 tablet 3  . fluticasone (FLONASE) 50 MCG/ACT nasal spray Place 2 sprays into both nostrils daily. 16 g 6  . gabapentin (NEURONTIN) 300 MG capsule TAKE 1 TO 2 CAPSULES BY MOUTH UP TO TWO TIMES DAILY AS NEEDED 180 capsule 3  . losartan (COZAAR) 50 MG tablet Take 1 tablet (50 mg total) by mouth daily. Take 1 tablet in the morning and 1 tablet in evening as needed for blood pressure greater than 150 180 tablet 3  . Melatonin 5 MG TABS Take 5 mg by mouth at bedtime.     . memantine (NAMENDA) 10 MG tablet TAKE 1 TABLET TWICE DAILY 180 tablet 4  . metFORMIN (GLUCOPHAGE-XR) 500 MG 24 hr tablet Take 500 mg by mouth daily with breakfast.    . metoprolol tartrate (LOPRESSOR) 100 MG tablet TAKE 1 AND 1/2 TABLETS TWICE DAILY 270 tablet 3  . NON FORMULARY Place into the nose at bedtime. cpap machine    . ondansetron (ZOFRAN) 4 MG tablet Take 1 tablet (4 mg total) by mouth daily as needed for nausea or vomiting. 30 tablet 1  . ONE TOUCH ULTRA TEST test strip USE TO TEST FASTING BLOOD SUGAR IN THE MORNING AND AS NEEDED  50 each 11  . pantoprazole (PROTONIX) 40 MG tablet Take 40 mg by mouth 2 (two) times daily.    . polyethylene glycol powder (MIRALAX) powder Take 17 g by mouth daily as needed for mild constipation or moderate constipation.     . primidone (MYSOLINE) 50 MG tablet TAKE 1 TABLET AT BEDTIME 90 tablet 1  . promethazine (PHENERGAN) 25 MG suppository Place 1 suppository (25 mg total) rectally every 6 (six) hours as needed for nausea. 12 suppository 1  . traZODone (DESYREL) 100 MG tablet Take 1-1.5 tablets (100-150 mg total) by mouth at bedtime. 45 tablet 1  . venlafaxine XR (EFFEXOR-XR) 75 MG 24 hr capsule TAKE 1 CAPSULE EVERY DAY WITH BREAKFAST 90 capsule 3   No current facility-administered medications for this visit.    Allergies:   Codeine, Myrbetriq  [mirabegron], Percocet [oxycodone-acetaminophen], Celebrex [celecoxib], Darvocet [propoxyphene n-acetaminophen], Erythromycin, Hydrocodone, Lyrica [pregabalin], Macrodantin [nitrofurantoin], Toradol [ketorolac tromethamine], Tramadol, and Verapamil    Social History:  The patient  reports that she has never smoked. She has never used smokeless tobacco. She reports that she does not drink alcohol or use drugs.   Family History:  The patient's family history includes Asthma in her father, mother, sister, and sister; Breast cancer in her maternal grandmother; Colon polyps in her mother; Dementia in her sister; Diabetes in her mother; Heart attack in her father and mother; Heart disease in her father and mother; Hypertension in her sister; Lupus in her sister; Pancreatic cancer  in her maternal grandfather; Rheum arthritis in her sister; Stroke in her mother and sister; Wilson's disease in her maternal grandmother.    ROS: Noted in current history, all other systems are negative.   Physical Exam: Blood pressure (!) 94/48, pulse 70, height 5\' 6"  (1.676 m), weight 182 lb 3.2 oz (82.6 kg), SpO2 96 %.  GEN:  Well nourished, well developed in no acute  distress HEENT: Normal NECK: No JVD; No carotid bruits LYMPHATICS: No lymphadenopathy CARDIAC: RRR   RESPIRATORY:  Clear to auscultation without rales, wheezing or rhonchi  ABDOMEN: Soft, non-tender, non-distended MUSCULOSKELETAL:  No edema; No deformity  SKIN: Warm and dry NEUROLOGIC:  Alert and oriented x 3   EKG:        Recent Labs: 09/12/2019: TSH 1.43 11/06/2019: Magnesium 2.1 11/16/2019: ALT 22; Hemoglobin 12.4; Platelets 377 11/27/2019: BUN 10; Creatinine, Ser 0.80; Potassium 5.1; Sodium 138    Lipid Panel    Component Value Date/Time   CHOL 128 06/07/2019 0934   TRIG 97 11/07/2019 0004   HDL 52 06/07/2019 0934   CHOLHDL 2.5 06/07/2019 0934   CHOLHDL 3.2 08/28/2016 1558   VLDL 22 08/28/2016 1558   LDLCALC 51 06/07/2019 0934      Wt Readings from Last 3 Encounters:  12/05/19 182 lb 3.2 oz (82.6 kg)  11/27/19 178 lb (80.7 kg)  11/07/19 195 lb 5.2 oz (88.6 kg)      Other studies Reviewed: Additional studies/ records that were reviewed today include: . Review of the above records demonstrates:    ASSESSMENT AND PLAN:  1. SVT -    No recent episodes   2.  Hypertension:   BP is actully low today.  Dc amlodipine ,  Cont current dose of Losartan    4. Minimal CAD by cath 2006 [name]) No angina   5. Covid in fection :   Still has fatigued.   Dyspnea.   Told her to expect to be fatigued for several months   6. Dyspnea on exertion:     Current medicines are reviewed at length with the patient today.  The patient does not have concerns regarding medicines.  The following changes have been made:  no change  Disposition:   FU with  APP in 6  months     Mertie Moores, MD  12/05/2019 [facility] [place], [place], [place]  [address] Phone: [phone]; Fax: [phone]

## 2019-12-05 ENCOUNTER — Ambulatory Visit (INDEPENDENT_AMBULATORY_CARE_PROVIDER_SITE_OTHER): Payer: Medicare Other | Admitting: Family Medicine

## 2019-12-05 ENCOUNTER — Other Ambulatory Visit: Payer: Self-pay

## 2019-12-05 ENCOUNTER — Telehealth: Payer: Self-pay

## 2019-12-05 ENCOUNTER — Encounter: Payer: Self-pay | Admitting: Cardiovascular Disease

## 2019-12-05 ENCOUNTER — Encounter: Payer: Self-pay | Admitting: Family Medicine

## 2019-12-05 ENCOUNTER — Ambulatory Visit (INDEPENDENT_AMBULATORY_CARE_PROVIDER_SITE_OTHER): Payer: Medicare Other | Admitting: Cardiovascular Disease

## 2019-12-05 ENCOUNTER — Inpatient Hospital Stay: Admit: 2019-12-05 | Payer: MEDICARE | Primary: Internal Medicine

## 2019-12-05 ENCOUNTER — Ambulatory Visit: Admit: 2019-12-05 | Discharge: 2019-12-05 | Payer: MEDICARE | Primary: Internal Medicine

## 2019-12-05 VITALS — BP 145/81 | HR 68

## 2019-12-05 VITALS — BP 94/48 | HR 70 | Ht 66.0 in | Wt 182.2 lb

## 2019-12-05 DIAGNOSIS — I471 Supraventricular tachycardia: Secondary | ICD-10-CM | POA: Diagnosis not present

## 2019-12-05 DIAGNOSIS — G4700 Insomnia, unspecified: Secondary | ICD-10-CM

## 2019-12-05 DIAGNOSIS — E782 Mixed hyperlipidemia: Secondary | ICD-10-CM

## 2019-12-05 DIAGNOSIS — I10 Essential (primary) hypertension: Secondary | ICD-10-CM | POA: Diagnosis not present

## 2019-12-05 DIAGNOSIS — E119 Type 2 diabetes mellitus without complications: Secondary | ICD-10-CM

## 2019-12-05 LAB — MICROALBUMIN, UR, RAND W/ MICROALB/CREAT RATIO
Creatinine, urine random: 53 mg/dL (ref 30–125)
Microalbumin,urine random: <0.5 MG/DL (ref 0–3.0)

## 2019-12-05 LAB — HEMOGLOBIN A1C W/O EAG
Hemoglobin A1C: 5.9 % — ABNORMAL HIGH (ref 4.2–5.6)
Hemoglobin A1c: 5.9 % — ABNORMAL HIGH (ref 4.2–5.6)

## 2019-12-05 LAB — VITAMIN D, 25 HYDROXY: Vitamin D 25-Hydroxy: 88.9 ng/mL (ref 30–100)

## 2019-12-05 LAB — MICROALBUMIN / CREATININE URINE RATIO
Creatinine, Ur: 53 mg/dL (ref 30–125)
Microalb, Ur: <0.5 MG/DL (ref 0–3.0)

## 2019-12-05 LAB — VITAMIN D 25 HYDROXY: Vit D, 25-Hydroxy: 88.9 ng/mL (ref 30–100)

## 2019-12-05 LAB — BASIC METABOLIC PANEL
BUN/Creatinine Ratio: 19 (ref 12–28)
BUN: 15 mg/dL (ref 8–27)
CO2: 23 mmol/L (ref 20–29)
Calcium: 9.7 mg/dL (ref 8.7–10.3)
Chloride: 102 mmol/L (ref 96–106)
Creatinine, Ser: 0.81 mg/dL (ref 0.57–1.00)
GFR calc Af Amer: 86 mL/min/{1.73_m2} (ref >59)
GFR calc non Af Amer: 75 mL/min/{1.73_m2} (ref >59)
Glucose: 91 mg/dL (ref 65–99)
Potassium: 4.6 mmol/L (ref 3.5–5.2)
Sodium: 137 mmol/L (ref 134–144)

## 2019-12-05 MED ORDER — ZOLPIDEM TARTRATE 5 MG PO TABS: 5.0000 mg | ORAL_TABLET | Freq: Every evening | ORAL | 1 refills | Status: DC | PRN

## 2019-12-05 NOTE — Progress Notes (Signed)
   Pt reports that she has been taking 200 mg of Trazodone. (told that ) She has tried taking this 45 minutes before going to bed.  She doesn't feel anxious/worried. She doesn't understand why this is happening and never had this problem before. She said that this all started when she was in the hospital. She said that she was given Ambien while she was in the hospital.   Cardiology told her to take her BP BID and told her to stop taking Amlodipine 5 mg.told to call their office if it goes over 130/80.   She stopped the prednisone. She has an appt with a "Spine" surgeon next week

## 2019-12-05 NOTE — Progress Notes (Signed)
Virtual Visit via Video Note  I connected with Mia [name] on 12/05/19 at  1:20 PM EST by a video enabled telemedicine application and verified that I am speaking with the correct person using two identifiers.   I discussed the limitations of evaluation and management by telemedicine and the availability of in person appointments. The patient expressed understanding and agreed to proceed.  Subjective:    CC:   HPI:  Pt reports that she has been taking 200 mg of Trazodone. (told that ) She has tried taking this 45 minutes before going to bed. She doesn't feel anxious or worried.  She is staying asleep. She feels like her breathing is better. She had ambien at the hospital. Never had sleep issues since since in the hospital.   She doesn't feel anxious/worried. She doesn't understand why this is happening and never had this problem before. She said that this all started when she was in the hospital. She said that she was given Ambien while she was in the hospital.   Cardiology told her to take her BP BID and told her to stop taking Amlodipine 5 mg. told to call their office if it goes over 130/80. They are changing the timing of her medication.   She stopped the prednisone today. She has an appt with a "Spine" surgeon next week for consult for her back and neck.     Past medical history, Surgical history, Family history not pertinant except as noted below, Social history, Allergies, and medications have been entered into the medical record, reviewed, and corrections made.   Review of Systems: No fevers, chills, night sweats, weight loss, chest pain, or shortness of breath.   Objective:    General: Speaking clearly in complete sentences without any shortness of breath.  Alert and oriented x3.  Normal judgment. No apparent acute distress.    Impression and Recommendations:    Insomnia - D/C trazodone. Will change to low dose Ambien. Used sparingly.  Monitor carefully for respiratory  depression and also monitor for excess sedation in the mornings.  Did warn about potential interaction with other sedative drugs like her gabapentin.  Uncontrolled HTN - off amlodipine now  They have moved around timing of medications. Tends to be lower in the AM and higher in the evenings.    Hypokalemia - resolved on labs from 2/1. Will hold supplement for 2 weeks and recheck.   Neck and Back Pain -just completed her steroids today.  I think that might even improve her insomnia.  Has follow-up with spinal surgeon next week.  She will keep [place] in the loop what they decide.  Pernicious  Anemia  - plan to reschedule ASAP.  She missed her injection while she was hospitalized.    I discussed the assessment and treatment plan with the patient. The patient was provided an opportunity to ask questions and all were answered. The patient agreed with the plan and demonstrated an understanding of the instructions.   The patient was advised to call back or seek an in-person evaluation if the symptoms worsen or if the condition fails to improve as anticipated.  Time spent 25 minutes in encounter.  Mia Lecher, MD

## 2019-12-05 NOTE — Patient Instructions (Signed)
Medication Instructions:  Your physician has recommended you make the following change in your medication:  STOP Amlodipine (Norvasc) Take only Losartan 50 mg daily **Call is if BP is consistently >130/80 mmHg  *If you need a refill on your cardiac medications before your next appointment, please call your pharmacy*  Lab Work: TODAY - basic metabolic panel If you have labs (blood work) drawn today and your tests are completely normal, you will receive your results only by: Marland Kitchen MyChart Message (if you have MyChart) OR . A paper copy in the mail If you have any lab test that is abnormal or we need to change your treatment, we will call you to review the results.   Testing/Procedures: None Ordered   Follow-Up: At [facility], you and your health needs are our priority.  As part of our continuing mission to provide you with exceptional heart care, we have created designated Provider Care Teams.  These Care Teams include your primary Cardiologist (physician) and Advanced Practice Providers (APPs -  Physician Assistants and Nurse Practitioners) who all work together to provide you with the care you need, when you need it.  Your next appointment:   3 month(s) on Wed. May 12  The format for your next appointment:   In Person  Provider:   Daune Perch, NP

## 2019-12-05 NOTE — Telephone Encounter (Signed)
Mia [name] has been scheduled for today to follow up on insomnia. She states she has increased the Trazodone to 200 mg and she is still having trouble sleeping.

## 2019-12-06 MED ORDER — CITALOPRAM 10 MG TAB
10 mg | ORAL_TABLET | ORAL | 3 refills | Status: DC
Start: 2019-12-06 — End: 2021-01-16

## 2019-12-07 ENCOUNTER — Encounter: Primary: Internal Medicine

## 2019-12-08 ENCOUNTER — Telehealth: Payer: Medicare Other | Admitting: Cardiology

## 2019-12-14 ENCOUNTER — Ambulatory Visit: Admit: 2019-12-14 | Discharge: 2019-12-14 | Payer: MEDICARE | Attending: Internal Medicine | Primary: Internal Medicine

## 2019-12-14 ENCOUNTER — Ambulatory Visit: Attending: Internal Medicine | Primary: Internal Medicine

## 2019-12-14 DIAGNOSIS — Z0000 Encounter for general adult medical examination without abnormal findings: Secondary | ICD-10-CM

## 2019-12-14 DIAGNOSIS — E559 Vitamin D deficiency, unspecified: Secondary | ICD-10-CM

## 2019-12-14 NOTE — Progress Notes (Signed)
69 y.o. white female who presents for f/u    Denied any cardiovascular complaints, the edema is not problematic at this time.  No exercise outside of walking for adls    No gi or gu issues, has rare breakthrough dyspepsia with specific foods although no alarm complaints    Denies polyuria, polydipsia, nocturia, vision change.  Doing better with her diet and weight is stable    Vitals 12/14/2019 06/06/2019 11/29/2018 05/26/2018   Weight 167 lb 163 lb 6.4 oz 164 lb 164 lb     No sx referable to the thyroid    LAST MEDICARE WELLNESS EXAM: 11/10/16, 11/23/17, 11/29/18, 12/14/19    Past Medical History:   Diagnosis Date   ??? Allergic rhinitis    ??? Anxiety state, unspecified    ??? Arthritis     Dr. Synetta Shadow, Dr. Marisa Hua   ??? Compression fx, thoracic spine [facility]) 2007    negative DEXA Dr. Marisa Hua   ??? Diabetes ([place]) 10/2017    on basis of fbs>125; intol metformin   ??? Dyslipidemia    ??? Dyspepsia    ??? 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 Frozen shoulder     right Dr. Wynonia Hazard MRI   ??? Left thyroid nodule 04/2016    1cm nodule; apparently noted on CT 2008 and unchanged   ??? Multiple lung nodules     no change 10/06, 03/07, 03/08   ??? Overweight (BMI 25.0-29.9)     IF 7/18 start weight 168 lbs not doing    ??? Painless hematuria 04/2016    Dr Jimmye Norman, neg eval   ??? Palpitations 2008    neg thallium 2008, nl holter 2008, echo nl lv/ef 65%/tr mr/dd/nl pasp   ??? Syncope     neurocardiogenic by tilt 1994   ??? Venous insufficiency    ??? Vitamin D deficiency      Past Surgical History:   Procedure Laterality Date   ??? CARDIAC SURG PROCEDURE UNLIST      thallium 2005 neg; NST 1/17 negative ef 75%   ??? ECHO 2D ADULT  12/04    normal with EF 70%   ??? HX COLONOSCOPY      Dr Rosendo Gros 2007 neg; 04/22/18 neg   ??? HX GYN      s/p BTL    ??? HX HEMORRHOIDECTOMY      Dr. Rosendo Gros 2007   ??? HX HYSTERECTOMY  03/11/2017    Dr Sheral Flow    ??? HX ORTHOPAEDIC      DEXA -0.5 spine, -0.8 hip (1/18)   ??? HX UROLOGICAL  06/2016    Dr Jimmye Norman; bladder bx showed benign lesions   ??? [place] ABD COMP  9/05    negative   ??? VAS CAROTID DUPLEX BILATERAL  2004    negative     Social History     Socioeconomic History   ??? Marital status: MARRIED     Spouse name: Not on file   ??? Number of children: 2   ??? Years of education: Not on file   ??? Highest education level: Not on file   Occupational History   ??? Occupation: benefits specialist   Social Needs   ??? Financial resource strain: Not on file   ??? Food insecurity     Worry: Not on file     Inability: Not on file   ???  Transportation needs     Medical: Not on file     Non-medical: Not on file   Tobacco Use   ??? Smoking status: Never Smoker   ??? Smokeless tobacco: Never Used   Substance and Sexual Activity   ??? Alcohol use: Yes     Alcohol/week: 4.0 standard drinks     Types: 4 Glasses of wine per week   ??? Drug use: No   ??? Sexual activity: Not on file   Lifestyle   ??? Physical activity     Days per week: Not on file     Minutes per session: Not on file   ??? Stress: Not on file   Relationships   ??? Social Product manager on phone: Not on file     Gets together: Not on file     Attends religious service: Not on file     Active member of club or organization: Not on file     Attends meetings of clubs or organizations: Not on file     Relationship status: Not on file   ??? Intimate partner violence     Fear of current or ex partner: Not on file     Emotionally abused: Not on file     Physically abused: Not on file     Forced sexual activity: Not on file   Other Topics Concern   ??? Not on file   Social History Narrative   ??? Not on file     Current Outpatient Medications   Medication Sig   ??? ergocalciferol (Vitamin D2) 1,250 mcg (50,000 unit) capsule Take 1 Cap by mouth every month.   ??? multivitamin (ONE A DAY) tablet Take 1 Tab by mouth daily.   ??? citalopram (CELEXA) 10 mg tablet take 1 tablet by mouth once daily    ??? atorvastatin (LIPITOR) 40 mg tablet take 1 tablet by mouth once daily   ??? SITagliptin (JANUVIA) 100 mg tablet Take 1 Tab by mouth daily.   ??? lansoprazole (PREVACID) 30 mg capsule take 1 capsule by mouth BEFORE BREAKFAST DAILY   ??? Blood-Glucose Meter monitoring kit Use daily as directed   ??? glucose blood VI test strips (BLOOD GLUCOSE TEST) strip Use daily as directed   ??? lancets misc Use daily as directed   ??? omega-3 fatty acids (FISH OIL) cap Take 2 Tabs by mouth daily.   ??? aspirin 81 mg chewable tablet Take 81 mg by mouth daily.     No current facility-administered medications for this visit.      Allergies   Allergen Reactions   ??? Fish Oil Nausea Only   ??? Metformin Diarrhea   ??? Relafen [Nabumetone] Nausea Only     REVIEW OF SYSTEMS: gyn 4/18 Dr Andrew Au, mammo 3/19, colo 6/19 Dr Rosendo Gros, DEXA 1/18 Dr Margaretann Loveless ??? no vision change or eye pain  Oral ??? no mouth pain, tongue or tooth problems  Ears ??? no hearing loss, ear pain, fullness, no swallowing problems  Cardiac ??? no CP, PND, orthopnea, edema, palpitations or syncope  Chest ??? no breast masses  Resp ??? no wheezing, chronic coughing, dyspnea  Urinary ??? no dysuria, hematuria, flank pain, urgency, frequency    Visit Vitals  BP 121/67   Pulse 77   Temp 97 ??F (36.1 ??C) (Temporal)   Resp 14   Ht '5\' 4"'  (1.626 m)   Wt 167 lb (75.8 kg)   SpO2 96%   BMI 28.67 kg/m??  Affect is appropriate.  Mood stable  No apparent distress  HEENT --Anicteric sclerae.  No JVD, or bruits.  Thyroid fullness on left  Lungs --Clear to auscultation and percussion, normal percussion.  Heart --Regular rate and rhythm, no murmurs, rubs, gallops, or clicks.  Abdomen -- Soft and nontender, no hepatosplenomegaly or masses.  Extremities -- Without cyanosis, clubbing, edema. 2+ pulses equally and bilaterally.  Varicose veins bilat, trace pedal edema symmetrically    LABS   From 5/10 showed   gluc 110, cr 0.70,               alt 23,                                    chol 154, tg 143, hdl 45, ldl-c 80,                                                           tsh 1.91  From 5/10 showed                     2 hr GTT 89  From 8/10 showed                                                              vit d 23.0, ck 57, aldo 5.4  From 8/11 showed   gluc 108,                                     hba1c 6.3,                   chol 160, tg 172, hdl 39, ldl-c 87,  wbc 5.,7 hb 12.3, plt 235, ua neg,     tsh 2.28  From 5/12 showed   gluc 113, cr 0.71, gfr 94,  alt 16, hba1c 6.2, ldl-p 1989, chol 177, tg 161, hdl 43, ldl-c 102  From 11/12 showed                                                     hba1c 5.9, ldl-p 1218, chol 133, tg 116, hdl 45, ldl-c 65  From 5/13 showed   gluc 105, cr 0.55, gfr 102, alt 8,  hba1c 6.2,                   chol 148, tg 107, hdl 45, ldl-c 82,  wbc 5.0, hb 12.5, plt 172, vit d 40.3  From 11/13 showed        hba1c 6.2, ldl-p 1888, chol 176, tg 155, hdl 49, ldl-c 86,  wbc 6.2, hb 12.3, plt 182, vit d 34.4  From 5/14 showed        hba1c 6.2,     chol 152, tg 157, hdl 39,  ldl-c 82  From 1/15 showed   gluc 113, cr 0.68, gfr>60, alt 11, hba1c 6.2,     chol 146, tg 130, hdl 43, ldl-c 77  From 7/15 showed        hba1c 6.3,     chol 133, tg 124, hdl 39, ldl-c 69, wbc 6.3, hb 12.2, plt 193  From 6/16 showed   gluc 106, cr 0.74, gfr>60, alt 26, hba1c 6.3,     chol 126, tg 125, hdl 46, ldl-c 55, wbc 5.7, hb 13.2, plt 203, vit d 7.2,   tsh 1.69  From 12/16 showed gluc 110, cr 0.68, gfr>60, alt 30, hba1c 6.1,     chol 135, tg 147, hdl 47, ldl-c 59, wbc 6.0, hb 12.7, plt 197  From 6/17 showed   gluc 122, cr 0.71, gfr>60, alt 33, hba1c 6.2,     chol 153, tg 140, hdl 46, ldl-c 79, wbc 6.3, hb 13.1, plt 190,        tsh 1.92, hep c neg  From 1/18 showed        hba1c 6.2,    chol 154, tg 181, hdl 41, ldl-c 77   From 7/18 showed   gluc 129, cr 0.63, gfr>60, alt 26, hba1c 6.1,              wbc 6.0, hb 12.4, plt 193, vit d 79  From 1/19 showed   gluc 132, cr 0.83, gfr>60,    hba1c 6.1  From 4/19 showed        hba1c 7.0, umar 12 ,  chol 124, tg 109, hdl 46, ldl-c 56,                 vit d 56.1  From 7/19 showed   gluc 106, cr 0.67, gfr>60, alt 25, hba1c 6.0,               wbc 5.8, hb 13.0, plt 183  From 1/20 showed       hba1c 6.0, umar 17,   chol 130, tg 113, hdl 46, ldl-c 61  From 8/20 showed   gluc 103, cr 0.73, gfr>60, alt 27, hba1c 5.7,     chol 124, tg 150, hdl 44, ldl-c 50,  wbc 5.0, hb13.0, plt 190    Results for orders placed or performed during the hospital encounter of 12/05/19   HEMOGLOBIN A1C W/O EAG   Result Value Ref Range    Hemoglobin A1c 5.9 (H) 4.2 - 5.6 %   MICROALBUMIN, UR, RAND W/ MICROALB/CREAT RATIO   Result Value Ref Range    Microalbumin,urine random <0.50 0 - 3.0 MG/DL    Creatinine, urine 53.00 30 - 125 mg/dL    Microalbumin/Creat ratio (mg/g creat)  0 - 30 mg/g     Cannot calculate ratio due to microalbumin result outside reportable range.   VITAMIN D, 25 HYDROXY   Result Value Ref Range    Vitamin D 25-Hydroxy 88.9 30 - 100 ng/mL     We reviewed the patient's labs from the last several visits to point out trends in the numbers          Patient Active Problem List   Diagnosis Code   ??? Vitamin D deficiency E55.9   ??? Anxiety F41.9   ??? Dyslipidemia E78.5   ??? 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 Overweight with body mass index (BMI) of 25 to 25.9 in adult E66.3, Z68.25   ??? Controlled type 2 diabetes mellitus, without long-term current  use of insulin (HCC) E11.9     Assessment and plan:  1. DM.  Continue current regimen.  F/u ophth  2. Hyperlipidemia. Continue current.  vascepa once covered  3. Hypovitaminosis D. Continue current regimen.  4. Anxiety.  Continue celexa  5. Overweight.  Lifestyle and dietary measures.  Portion control reiterated.  6. Endometrial ca.  Per Dr Sheral Flow   7. Edema.  Declined diuretic for now; elev, cut salt, wt loss, etc        RTC 8/21    Above conditions discussed at length and patient vocalized understanding.  All questions answered to patient satisfaction

## 2019-12-14 NOTE — Patient Instructions (Signed)
Medicare Wellness Visit, Female     The best way to live healthy is to have a lifestyle where you eat a well-balanced diet, exercise regularly, limit alcohol use, and quit all forms of tobacco/nicotine, if applicable.     Regular preventive services are another way to keep healthy. Preventive services (vaccines, screening tests, monitoring & exams) can help personalize your care plan, which helps you manage your own care. Screening tests can find health problems at the earliest stages, when they are easiest to treat.   [facility] follows the current, evidence-based guidelines published by the [place] [employer] (USPSTF) when recommending preventive services for our patients. Because we follow these guidelines, sometimes recommendations change over time as research supports it. (For example, mammograms used to be recommended annually. Even though Medicare will still pay for an annual mammogram, the newer guidelines recommend a mammogram every two years for women of average risk).  Of course, you and your doctor may decide to screen more often for some diseases, based on your risk and your co-morbidities (chronic disease you are already diagnosed with).     Preventive services for you include:  - Medicare offers their members a free annual wellness visit, which is time for you and your primary care provider to discuss and plan for your preventive service needs. Take advantage of this benefit every year!  -All adults over the age of 80 should receive the recommended pneumonia vaccines. Current USPSTF guidelines recommend a series of two vaccines for the best pneumonia protection.   -All adults should have a flu vaccine yearly and a tetanus vaccine every 10 years.   -All adults age 84 and older should receive the shingles vaccines (series of two vaccines).      -All adults age 51-70 who are overweight should have a diabetes screening test once every three years.    -All adults born between [birth year] and 1965 should be screened once for Hepatitis C.  -Other screening tests and preventive services for persons with diabetes include: an eye exam to screen for diabetic retinopathy, a kidney function test, a foot exam, and stricter control over your cholesterol.   -Cardiovascular screening for adults with routine risk involves an electrocardiogram (ECG) at intervals determined by your doctor.   -Colorectal cancer screenings should be done for adults age 3-75 with no increased risk factors for colorectal cancer.  There are a number of acceptable methods of screening for this type of cancer. Each test has its own benefits and drawbacks. Discuss with your doctor what is most appropriate for you during your annual wellness visit. The different tests include: colonoscopy (considered the best screening method), a fecal occult blood test, a fecal DNA test, and sigmoidoscopy.    -A bone mass density test is recommended when a woman turns 65 to screen for osteoporosis. This test is only recommended one time, as a screening. Some providers will use this same test as a disease monitoring tool if you already have osteoporosis.  -Breast cancer screenings are recommended every other year for women of normal risk, age 19-74.  -Cervical cancer screenings for women over age 5 are only recommended with certain risk factors.     Here is a list of your current Health Maintenance items (your personalized list of preventive services) with a due date:  Health Maintenance Due   Topic Date Due   ??? Diabetic Foot Care  08/29/1961   ??? COVID-19 Vaccine (2 of 2 - Moderna  series) 12/18/2019

## 2019-12-14 NOTE — Progress Notes (Signed)
This is a subsequent medicare annual welness exam    I have reviewed the patient's medical history in detail and updated the computerized patient record.     Depression Risk Factor Screening:     3 most recent PHQ Screens 12/14/2019   Little interest or pleasure in doing things Not at all   Feeling down, depressed, irritable, or hopeless Not at all   Total Score PHQ 2 0     SCREENINGS  Colonoscopy last done 6/19  Mammogram last done 9/20  DEXA last done 1/18  Gyn last done >5 yrs     Immunization History   Administered Date(s) Administered   ??? Covid-19, MODERNA, Mrna, Lnp-s, Pf, 157mg/0.5mL 11/20/2019   ??? Influenza High Dose Vaccine PF 09/07/2016, 07/29/2019   ??? Influenza Vaccine 07/27/2011, 07/26/2014, 07/27/2015   ??? Influenza Vaccine (Tri) Adjuvanted (>65 Yrs FLUAD TRI [id number] 08/04/2017, 08/19/2018   ??? Influenza Vaccine PF 08/25/2013   ??? Influenza Vaccine Whole 09/14/2008   ??? Pneumococcal Conjugate (PCV-13) 11/10/2016   ??? Pneumococcal Polysaccharide (PPSV-23) 11/23/2017   ??? TDAP Vaccine 02/09/2011   ??? Zoster Recombinant 01/05/2019, 03/15/2019   ??? Zoster Vaccine, Live 03/22/2012     Alcohol Risk Screen    Do you average more than 1 drink per night or more than 7 drinks a week:  No    On any one occasion in the past three months have you have had more than 3 drinks containing alcohol:  No        Functional Ability and Level of Safety:    Hearing: Hearing is good.      Activities of Daily Living:  The home contains: no safety equipment.  Patient does total self care      Ambulation: with no difficulty     Fall Risk:  Fall Risk Assessment, last 12 mths 12/14/2019   Able to walk? Yes   Fall in past 12 months? 0   Do you feel unsteady? 0   Are you worried about falling 0      Abuse Screen:  Patient is not abused       Cognitive Screening    Has your family/caregiver stated any concerns about your memory: no       Assessment/Plan   Education and counseling provided:  Are appropriate based on today's review and evaluation   End-of-Life planning (with patient's consent)  Influenza Vaccine  Screening Mammography  Colorectal cancer screening tests  Cardiovascular screening blood test  Bone mass measurement (DEXA)    Diagnoses and all orders for this visit:    1. Vitamin D deficiency  -     ergocalciferol (Vitamin D2) 1,250 mcg (50,000 unit) capsule; Take 1 Cap by mouth every month.  -     VITAMIN D, 25 HYDROXY; Future    2. Controlled type 2 diabetes mellitus without complication, without long-term current use of insulin (HCC)  -     CBC W/O DIFF; Future  -     LIPID PANEL; Future  -     METABOLIC PANEL, COMPREHENSIVE; Future  -     HEMOGLOBIN A1C W/O EAG; Future    3. Osteoarthritis, unspecified osteoarthritis type, unspecified site    4. Anxiety    5. Dyslipidemia    6. Medicare annual wellness visit, subsequent    7. Advanced directives, counseling/discussion        Health Maintenance Due     Health Maintenance Due   Topic Date Due   ??? Foot Exam  Q1  08/29/1961   ??? COVID-19 Vaccine (2 of 2 - Moderna series) 12/18/2019       Patient Care Team   Patient Care Team:  Albin Felling, MD as PCP - General (Internal Medicine)  Albin Felling, MD as PCP - [facility] Empaneled Provider    History     Patient Active Problem List   Diagnosis Code   ??? Vitamin D deficiency E55.9   ??? Anxiety F41.9   ??? Dyslipidemia E78.5   ??? 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 Overweight with body mass index (BMI) of 25 to 25.9 in adult E66.3, Z68.25   ??? Controlled type 2 diabetes mellitus, without long-term current use of insulin (HCC) E11.9     Past Medical History:   Diagnosis Date   ??? Allergic rhinitis    ??? Anxiety state, unspecified    ??? Arthritis     Dr. Synetta Shadow, Dr. Marisa Hua   ??? Compression fx, thoracic spine [facility]) 2007    negative DEXA Dr. Marisa Hua   ??? Diabetes ([place]) 10/2017    on basis of fbs>125; intol metformin   ??? Dyslipidemia    ??? Dyspepsia    ??? 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 Frozen shoulder     right Dr. Wynonia Hazard MRI   ??? Left thyroid nodule 04/2016    1cm nodule; apparently noted on CT 2008 and unchanged   ??? Multiple lung nodules     no change 10/06, 03/07, 03/08   ??? Overweight (BMI 25.0-29.9)     IF 7/18 start weight 168 lbs not doing    ??? Painless hematuria 04/2016    Dr Jimmye Norman, neg eval   ??? Palpitations 2008    neg thallium 2008, nl holter 2008, echo nl lv/ef 65%/tr mr/dd/nl pasp   ??? Syncope     neurocardiogenic by tilt 1994   ??? Venous insufficiency    ??? Vitamin D deficiency       Past Surgical History:   Procedure Laterality Date   ??? CARDIAC SURG PROCEDURE UNLIST      thallium 2005 neg; NST 1/17 negative ef 75%   ??? ECHO 2D ADULT  12/04    normal with EF 70%   ??? HX COLONOSCOPY      Dr Rosendo Gros 2007 neg; 04/22/18 neg   ??? HX GYN      s/p BTL    ??? HX HEMORRHOIDECTOMY      Dr. Rosendo Gros 2007   ??? HX HYSTERECTOMY  03/11/2017    Dr Sheral Flow   ??? HX ORTHOPAEDIC      DEXA -0.5 spine, -0.8 hip (1/18)   ??? HX UROLOGICAL  06/2016    Dr Jimmye Norman; bladder bx showed benign lesions   ??? [place] ABD COMP  9/05    negative   ??? VAS CAROTID DUPLEX BILATERAL  2004    negative     Current Outpatient Medications   Medication Sig Dispense Refill   ??? ergocalciferol (Vitamin D2) 1,250 mcg (50,000 unit) capsule Take 1 Cap by mouth every month. 12 Cap 1   ??? multivitamin (ONE A DAY) tablet Take 1 Tab by mouth daily.     ??? citalopram (CELEXA) 10 mg tablet take 1 tablet by mouth once daily 90 Tab 3   ??? atorvastatin (LIPITOR) 40 mg tablet take 1 tablet by mouth once  daily 90 Tab 3   ??? SITagliptin (JANUVIA) 100 mg tablet Take 1 Tab by mouth daily. 90 Tab 3   ??? lansoprazole (PREVACID) 30 mg capsule take 1 capsule by mouth BEFORE BREAKFAST DAILY 90 Cap 3   ??? Blood-Glucose Meter monitoring kit Use daily as directed 1 Kit 0    ??? glucose blood VI test strips (BLOOD GLUCOSE TEST) strip Use daily as directed 100 Strip 11   ??? lancets misc Use daily as directed 100 Each 11   ??? omega-3 fatty acids (FISH OIL) cap Take 2 Tabs by mouth daily.     ??? aspirin 81 mg chewable tablet Take 81 mg by mouth daily.       Allergies   Allergen Reactions   ??? Fish Oil Nausea Only   ??? Metformin Diarrhea   ??? Relafen [Nabumetone] Nausea Only       Family History   Problem Relation Age of Onset   ??? Diabetes Mother    ??? Elevated Lipids Father    ??? Stroke Paternal Grandmother    ??? Heart Disease Paternal Grandfather      Social History     Tobacco Use   ??? Smoking status: Never Smoker   ??? Smokeless tobacco: Never Used   Substance Use Topics   ??? Alcohol use: Yes     Alcohol/week: 4.0 standard drinks     Types: 4 Glasses of wine per week

## 2019-12-14 NOTE — Progress Notes (Signed)
Laura Roman presents today for   Chief Complaint   Patient presents with   ??? Annual Wellness Visit   ??? Cholesterol Problem     f/u with labs              Depression Screening:  3 most recent PHQ Screens 11/29/2018   Little interest or pleasure in doing things Not at all   Feeling down, depressed, irritable, or hopeless Not at all   Total Score PHQ 2 0       Learning Assessment:  Learning Assessment 07/17/2015   PRIMARY LEARNER Patient   HIGHEST LEVEL OF EDUCATION - PRIMARY LEARNER  GRADUATED HIGH SCHOOL OR GED   BARRIERS PRIMARY LEARNER -   CO-LEARNER CAREGIVER -   PRIMARY LANGUAGE ENGLISH   LEARNER PREFERENCE PRIMARY READING     LISTENING     DEMONSTRATION   ANSWERED BY patient Laura Roman   RELATIONSHIP SELF       Abuse Screening:  Abuse Screening Questionnaire 11/29/2018   Do you ever feel afraid of your partner? N   Are you in a relationship with someone who physically or mentally threatens you? N   Is it safe for you to go home? Y       Fall Risk  Fall Risk Assessment, last 12 mths 11/29/2018   Able to walk? Yes   Fall in past 12 months? No           Coordination of Care:  1. Have you been to the ER, urgent care clinic since your last visit?   Hospitalized since your last visit? no    2. Have you seen or consulted any other health care providers outside of the [facility] since your last visit?Include any pap smears or colon screening. No

## 2019-12-14 NOTE — ACP (Advance Care Planning) (Signed)
Advance Care Planning     General Advance Care Planning (ACP) Conversation      Date of Conversation: 12/14/2019  Conducted with: Patient with Decision Making Capacity    Healthcare Decision Maker:     Click here to complete [place] including selection of the [place] Relationship (ie "Primary")  Today we documented Decision Maker(s) consistent with Legal Next of Kin hierarchy.    Content/Action Overview:   Has NO ACP documents/care preferences - information provided, considering goals and options  Reviewed DNR/DNI and patient elects Full Code (Attempt Resuscitation)  Topics discussed: ventilation preferences, hospitalization preferences and resuscitation preferences  Additional Comments: none     Length of Voluntary ACP Conversation in minutes:  16 minutes    Amijah Timothy Jene Every, MD

## 2019-12-14 NOTE — Progress Notes (Signed)
69 y.o. white female who presents for f/u    Denied any cardiovascular complaints, the edema is not problematic at this time.  No exercise outside of walking for adls    No gi or gu issues, has rare breakthrough dyspepsia with specific foods although no alarm complaints    Denies polyuria, polydipsia, nocturia, vision change.  Doing better with her diet and weight is stable    Vitals 12/14/2019 06/06/2019 11/29/2018 05/26/2018   Weight 167 lb 163 lb 6.4 oz 164 lb 164 lb     No sx referable to the thyroid    LAST MEDICARE WELLNESS EXAM: 11/10/16, 11/23/17, 11/29/18, 12/14/19    Past Medical History:   Diagnosis Date   ??? Allergic rhinitis    ??? Anxiety state, unspecified    ??? Arthritis     Dr. Synetta Shadow, Dr. Marisa Hua   ??? Compression fx, thoracic spine [facility]) 2007    negative DEXA Dr. Marisa Hua   ??? Diabetes ([place]) 10/2017    on basis of fbs>125; intol metformin   ??? Dyslipidemia    ??? Dyspepsia    ??? Endometrial ca ([place]) 02/2017    Dr Sheral Flow; stage 1B gr 1 endometroid adenoca w foci squamous diff of the endometrium; RA TLH BSO PLND, 0/13LN, MSI intact   ??? Frozen shoulder     right Dr. Wynonia Hazard MRI   ??? Left thyroid nodule 04/2016    1cm nodule; apparently noted on CT 2008 and unchanged   ??? Multiple lung nodules     no change 10/06, 03/07, 03/08   ??? Overweight (BMI 25.0-29.9)     IF 7/18 start weight 168 lbs not doing    ??? Painless hematuria 04/2016    Dr Jimmye Norman, neg eval   ??? Palpitations 2008    neg thallium 2008, nl holter 2008, echo nl lv/ef 65%/tr mr/dd/nl pasp   ??? Syncope     neurocardiogenic by tilt 1994   ??? Venous insufficiency    ??? Vitamin D deficiency      Past Surgical History:   Procedure Laterality Date   ??? CARDIAC SURG PROCEDURE UNLIST      thallium 2005 neg; NST 1/17 negative ef 75%   ??? ECHO 2D ADULT  12/04    normal with EF 70%   ??? HX COLONOSCOPY      Dr Rosendo Gros 2007 neg; 04/22/18 neg   ??? HX GYN      s/p BTL    ??? HX HEMORRHOIDECTOMY      Dr. Rosendo Gros 2007   ??? HX HYSTERECTOMY  03/11/2017    Dr Sheral Flow   ??? HX  ORTHOPAEDIC      DEXA -0.5 spine, -0.8 hip (1/18)   ??? HX UROLOGICAL  06/2016    Dr Jimmye Norman; bladder bx showed benign lesions   ??? [place] ABD COMP  9/05    negative   ??? VAS CAROTID DUPLEX BILATERAL  2004    negative     Social History     Socioeconomic History   ??? Marital status: MARRIED     Spouse name: Not on file   ??? Number of children: 2   ??? Years of education: Not on file   ??? Highest education level: Not on file   Occupational History   ??? Occupation: benefits specialist   Social Needs   ??? Financial resource strain: Not on file   ??? Food insecurity     Worry: Not on file     Inability: Not on file   ???  Transportation needs     Medical: Not on file     Non-medical: Not on file   Tobacco Use   ??? Smoking status: Never Smoker   ??? Smokeless tobacco: Never Used   Substance and Sexual Activity   ??? Alcohol use: Yes     Alcohol/week: 4.0 standard drinks     Types: 4 Glasses of wine per week   ??? Drug use: No   ??? Sexual activity: Not on file   Lifestyle   ??? Physical activity     Days per week: Not on file     Minutes per session: Not on file   ??? Stress: Not on file   Relationships   ??? Social Product manager on phone: Not on file     Gets together: Not on file     Attends religious service: Not on file     Active member of club or organization: Not on file     Attends meetings of clubs or organizations: Not on file     Relationship status: Not on file   ??? Intimate partner violence     Fear of current or ex partner: Not on file     Emotionally abused: Not on file     Physically abused: Not on file     Forced sexual activity: Not on file   Other Topics Concern   ??? Not on file   Social History Narrative   ??? Not on file     Current Outpatient Medications   Medication Sig   ??? ergocalciferol (Vitamin D2) 1,250 mcg (50,000 unit) capsule Take 1 Cap by mouth every month.   ??? multivitamin (ONE A DAY) tablet Take 1 Tab by mouth daily.   ??? citalopram (CELEXA) 10 mg tablet take 1 tablet by mouth once daily   ??? atorvastatin (LIPITOR) 40  mg tablet take 1 tablet by mouth once daily   ??? SITagliptin (JANUVIA) 100 mg tablet Take 1 Tab by mouth daily.   ??? lansoprazole (PREVACID) 30 mg capsule take 1 capsule by mouth BEFORE BREAKFAST DAILY   ??? Blood-Glucose Meter monitoring kit Use daily as directed   ??? glucose blood VI test strips (BLOOD GLUCOSE TEST) strip Use daily as directed   ??? lancets misc Use daily as directed   ??? omega-3 fatty acids (FISH OIL) cap Take 2 Tabs by mouth daily.   ??? aspirin 81 mg chewable tablet Take 81 mg by mouth daily.     No current facility-administered medications for this visit.      Allergies   Allergen Reactions   ??? Fish Oil Nausea Only   ??? Metformin Diarrhea   ??? Relafen [Nabumetone] Nausea Only     REVIEW OF SYSTEMS: gyn 4/18 Dr Andrew Au, mammo 3/19, colo 6/19 Dr Rosendo Gros, DEXA 1/18 Dr Margaretann Loveless ??? no vision change or eye pain  Oral ??? no mouth pain, tongue or tooth problems  Ears ??? no hearing loss, ear pain, fullness, no swallowing problems  Cardiac ??? no CP, PND, orthopnea, edema, palpitations or syncope  Chest ??? no breast masses  Resp ??? no wheezing, chronic coughing, dyspnea  Urinary ??? no dysuria, hematuria, flank pain, urgency, frequency    Visit Vitals  BP 121/67   Pulse 77   Temp 97 ??F (36.1 ??C) (Temporal)   Resp 14   Ht '5\' 4"'  (1.626 m)   Wt 167 lb (75.8 kg)   SpO2 96%   BMI 28.67 kg/m??  Affect is appropriate.  Mood stable  No apparent distress  HEENT --Anicteric sclerae.  No JVD, or bruits.  Thyroid fullness on left  Lungs --Clear to auscultation and percussion, normal percussion.  Heart --Regular rate and rhythm, no murmurs, rubs, gallops, or clicks.  Abdomen -- Soft and nontender, no hepatosplenomegaly or masses.  Extremities -- Without cyanosis, clubbing, edema. 2+ pulses equally and bilaterally.  Varicose veins bilat, trace pedal edema symmetrically    LABS  From 5/10 showed   gluc 110, cr 0.70,               alt 23,                                    chol 154, tg 143, hdl 45, ldl-c 80,                                                            tsh 1.91  From 5/10 showed                     2 hr GTT 89  From 8/10 showed                                                              vit d 23.0, ck 57, aldo 5.4  From 8/11 showed   gluc 108,                                     hba1c 6.3,                   chol 160, tg 172, hdl 39, ldl-c 87,  wbc 5.,7 hb 12.3, plt 235, ua neg,     tsh 2.28  From 5/12 showed   gluc 113, cr 0.71, gfr 94,  alt 16, hba1c 6.2, ldl-p 1989, chol 177, tg 161, hdl 43, ldl-c 102  From 11/12 showed                                                     hba1c 5.9, ldl-p 1218, chol 133, tg 116, hdl 45, ldl-c 65  From 5/13 showed   gluc 105, cr 0.55, gfr 102, alt 8,  hba1c 6.2,                   chol 148, tg 107, hdl 45, ldl-c 82,  wbc 5.0, hb 12.5, plt 172, vit d 40.3  From 11/13 showed        hba1c 6.2, ldl-p 1888, chol 176, tg 155, hdl 49, ldl-c 86,  wbc 6.2, hb 12.3, plt 182, vit d 34.4  From 5/14 showed        hba1c 6.2,     chol 152, tg 157, hdl 39,  ldl-c 82  From 1/15 showed   gluc 113, cr 0.68, gfr>60, alt 11, hba1c 6.2,     chol 146, tg 130, hdl 43, ldl-c 77  From 7/15 showed        hba1c 6.3,     chol 133, tg 124, hdl 39, ldl-c 69, wbc 6.3, hb 12.2, plt 193  From 6/16 showed   gluc 106, cr 0.74, gfr>60, alt 26, hba1c 6.3,     chol 126, tg 125, hdl 46, ldl-c 55, wbc 5.7, hb 13.2, plt 203, vit d 7.2,   tsh 1.69  From 12/16 showed gluc 110, cr 0.68, gfr>60, alt 30, hba1c 6.1,     chol 135, tg 147, hdl 47, ldl-c 59, wbc 6.0, hb 12.7, plt 197  From 6/17 showed   gluc 122, cr 0.71, gfr>60, alt 33, hba1c 6.2,     chol 153, tg 140, hdl 46, ldl-c 79, wbc 6.3, hb 13.1, plt 190,        tsh 1.92, hep c neg  From 1/18 showed        hba1c 6.2,    chol 154, tg 181, hdl 41, ldl-c 77  From 7/18 showed   gluc 129, cr 0.63, gfr>60, alt 26, hba1c 6.1,              wbc 6.0, hb 12.4, plt 193, vit d 79  From 1/19 showed   gluc 132, cr 0.83, gfr>60,    hba1c 6.1  From 4/19 showed        hba1c 7.0, umar 12 ,  chol 124, tg 109,  hdl 46, ldl-c 56,                 vit d 56.1  From 7/19 showed   gluc 106, cr 0.67, gfr>60, alt 25, hba1c 6.0,               wbc 5.8, hb 13.0, plt 183  From 1/20 showed       hba1c 6.0, umar 17,   chol 130, tg 113, hdl 46, ldl-c 61  From 8/20 showed   gluc 103, cr 0.73, gfr>60, alt 27, hba1c 5.7,     chol 124, tg 150, hdl 44, ldl-c 50,  wbc 5.0, hb13.0, plt 190    Results for orders placed or performed during the hospital encounter of 12/05/19   HEMOGLOBIN A1C W/O EAG   Result Value Ref Range    Hemoglobin A1c 5.9 (H) 4.2 - 5.6 %   MICROALBUMIN, UR, RAND W/ MICROALB/CREAT RATIO   Result Value Ref Range    Microalbumin,urine random <0.50 0 - 3.0 MG/DL    Creatinine, urine 53.00 30 - 125 mg/dL    Microalbumin/Creat ratio (mg/g creat)  0 - 30 mg/g     Cannot calculate ratio due to microalbumin result outside reportable range.   VITAMIN D, 25 HYDROXY   Result Value Ref Range    Vitamin D 25-Hydroxy 88.9 30 - 100 ng/mL     We reviewed the patient's labs from the last several visits to point out trends in the numbers          Patient Active Problem List   Diagnosis Code   ??? Vitamin D deficiency E55.9   ??? Anxiety F41.9   ??? Dyslipidemia E78.5   ??? 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 Overweight with body mass index (BMI) of 25 to 25.9 in adult E66.3, Z68.25   ??? Controlled type 2 diabetes mellitus, without long-term current  use of insulin (HCC) E11.9     Assessment and plan:  1. DM.  Continue current regimen.  F/u ophth  2. Hyperlipidemia. Continue current.  vascepa once covered  3. Hypovitaminosis D. Continue current regimen.  4. Anxiety.  Continue celexa  5. Overweight.  Lifestyle and dietary measures.  Portion control reiterated.  6. Endometrial ca.  Per Dr Sheral Flow  7. Edema.  Declined diuretic for now; elev, cut salt, wt loss, etc        RTC 8/21    Above conditions discussed at length and patient vocalized understanding.  All questions answered to patient satisfaction

## 2019-12-14 NOTE — Progress Notes (Signed)
This is a subsequent medicare annual welness exam    I have reviewed the patient's medical history in detail and updated the computerized patient record.     Depression Risk Factor Screening:     3 most recent PHQ Screens 12/14/2019   Little interest or pleasure in doing things Not at all   Feeling down, depressed, irritable, or hopeless Not at all   Total Score PHQ 2 0     SCREENINGS  Colonoscopy last done 6/19  Mammogram last done 9/20  DEXA last done 1/18  Gyn last done >5 yrs     Immunization History   Administered Date(s) Administered   ??? Covid-19, MODERNA, Mrna, Lnp-s, Pf, 110mg/0.5mL 11/20/2019   ??? Influenza High Dose Vaccine PF 09/07/2016, 07/29/2019   ??? Influenza Vaccine 07/27/2011, 07/26/2014, 07/27/2015   ??? Influenza Vaccine (Tri) Adjuvanted (>65 Yrs FLUAD TRI [id number] 08/04/2017, 08/19/2018   ??? Influenza Vaccine PF 08/25/2013   ??? Influenza Vaccine Whole 09/14/2008   ??? Pneumococcal Conjugate (PCV-13) 11/10/2016   ??? Pneumococcal Polysaccharide (PPSV-23) 11/23/2017   ??? TDAP Vaccine 02/09/2011   ??? Zoster Recombinant 01/05/2019, 03/15/2019   ??? Zoster Vaccine, Live 03/22/2012     Alcohol Risk Screen    Do you average more than 1 drink per night or more than 7 drinks a week:  No    On any one occasion in the past three months have you have had more than 3 drinks containing alcohol:  No        Functional Ability and Level of Safety:    Hearing: Hearing is good.      Activities of Daily Living:  The home contains: no safety equipment.  Patient does total self care      Ambulation: with no difficulty     Fall Risk:  Fall Risk Assessment, last 12 mths 12/14/2019   Able to walk? Yes   Fall in past 12 months? 0   Do you feel unsteady? 0   Are you worried about falling 0      Abuse Screen:  Patient is not abused       Cognitive Screening    Has your family/caregiver stated any concerns about your memory: no       Assessment/Plan   Education and counseling provided:  Are appropriate based on today's review and  evaluation  End-of-Life planning (with patient's consent)  Influenza Vaccine  Screening Mammography  Colorectal cancer screening tests  Cardiovascular screening blood test  Bone mass measurement (DEXA)    Diagnoses and all orders for this visit:    1. Vitamin D deficiency  -     ergocalciferol (Vitamin D2) 1,250 mcg (50,000 unit) capsule; Take 1 Cap by mouth every month.  -     VITAMIN D, 25 HYDROXY; Future    2. Controlled type 2 diabetes mellitus without complication, without long-term current use of insulin (HCC)  -     CBC W/O DIFF; Future  -     LIPID PANEL; Future  -     METABOLIC PANEL, COMPREHENSIVE; Future  -     HEMOGLOBIN A1C W/O EAG; Future    3. Osteoarthritis, unspecified osteoarthritis type, unspecified site    4. Anxiety    5. Dyslipidemia    6. Medicare annual wellness visit, subsequent    7. Advanced directives, counseling/discussion        Health Maintenance Due     Health Maintenance Due   Topic Date Due   ??? Foot Exam  Q1  08/29/1961   ??? COVID-19 Vaccine (2 of 2 - Moderna series) 12/18/2019       Patient Care Team   Patient Care Team:  Albin Felling, MD as PCP - General (Internal Medicine)  Albin Felling, MD as PCP - [facility] Empaneled Provider    History     Patient Active Problem List   Diagnosis Code   ??? Vitamin D deficiency E55.9   ??? Anxiety F41.9   ??? Dyslipidemia E78.5   ??? 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 Overweight with body mass index (BMI) of 25 to 25.9 in adult E66.3, Z68.25   ??? Controlled type 2 diabetes mellitus, without long-term current use of insulin (HCC) E11.9     Past Medical History:   Diagnosis Date   ??? Allergic rhinitis    ??? Anxiety state, unspecified    ??? Arthritis     Dr. Synetta Shadow, Dr. Marisa Hua   ??? Compression fx, thoracic spine [facility]) 2007    negative DEXA Dr. Marisa Hua   ??? Diabetes ([place]) 10/2017    on basis of fbs>125; intol metformin   ??? Dyslipidemia    ??? Dyspepsia    ??? 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 Frozen shoulder     right Dr. Wynonia Hazard MRI   ??? Left thyroid nodule 04/2016    1cm nodule; apparently noted on CT 2008 and unchanged   ??? Multiple lung nodules     no change 10/06, 03/07, 03/08   ??? Overweight (BMI 25.0-29.9)     IF 7/18 start weight 168 lbs not doing    ??? Painless hematuria 04/2016    Dr Jimmye Norman, neg eval   ??? Palpitations 2008    neg thallium 2008, nl holter 2008, echo nl lv/ef 65%/tr mr/dd/nl pasp   ??? Syncope     neurocardiogenic by tilt 1994   ??? Venous insufficiency    ??? Vitamin D deficiency       Past Surgical History:   Procedure Laterality Date   ??? CARDIAC SURG PROCEDURE UNLIST      thallium 2005 neg; NST 1/17 negative ef 75%   ??? ECHO 2D ADULT  12/04    normal with EF 70%   ??? HX COLONOSCOPY      Dr Rosendo Gros 2007 neg; 04/22/18 neg   ??? HX GYN      s/p BTL    ??? HX HEMORRHOIDECTOMY      Dr. Rosendo Gros 2007   ??? HX HYSTERECTOMY  03/11/2017    Dr Sheral Flow   ??? HX ORTHOPAEDIC      DEXA -0.5 spine, -0.8 hip (1/18)   ??? HX UROLOGICAL  06/2016    Dr Jimmye Norman; bladder bx showed benign lesions   ??? [place] ABD COMP  9/05    negative   ??? VAS CAROTID DUPLEX BILATERAL  2004    negative     Current Outpatient Medications   Medication Sig Dispense Refill   ??? ergocalciferol (Vitamin D2) 1,250 mcg (50,000 unit) capsule Take 1 Cap by mouth every month. 12 Cap 1   ??? multivitamin (ONE A DAY) tablet Take 1 Tab by mouth daily.     ??? citalopram (CELEXA) 10 mg tablet take 1 tablet by mouth once daily 90 Tab 3   ??? atorvastatin (LIPITOR) 40 mg tablet take 1 tablet by mouth once  daily 90 Tab 3   ??? SITagliptin (JANUVIA) 100 mg tablet Take 1 Tab by mouth daily. 90 Tab 3   ??? lansoprazole (PREVACID) 30 mg capsule take 1 capsule by mouth BEFORE BREAKFAST DAILY 90 Cap 3   ??? Blood-Glucose Meter monitoring kit Use daily as directed 1 Kit 0   ??? glucose blood VI test strips (BLOOD GLUCOSE TEST) strip Use daily as directed 100 Strip 11   ??? lancets misc Use daily as directed  100 Each 11   ??? omega-3 fatty acids (FISH OIL) cap Take 2 Tabs by mouth daily.     ??? aspirin 81 mg chewable tablet Take 81 mg by mouth daily.       Allergies   Allergen Reactions   ??? Fish Oil Nausea Only   ??? Metformin Diarrhea   ??? Relafen [Nabumetone] Nausea Only       Family History   Problem Relation Age of Onset   ??? Diabetes Mother    ??? Elevated Lipids Father    ??? Stroke Paternal Grandmother    ??? Heart Disease Paternal Grandfather      Social History     Tobacco Use   ??? Smoking status: Never Smoker   ??? Smokeless tobacco: Never Used   Substance Use Topics   ??? Alcohol use: Yes     Alcohol/week: 4.0 standard drinks     Types: 4 Glasses of wine per week

## 2019-12-14 NOTE — Progress Notes (Signed)
Laura Roman presents today for   Chief Complaint   Patient presents with   . Annual Wellness Visit   . Cholesterol Problem     f/u with labs              Depression Screening:  3 most recent PHQ Screens 11/29/2018   Little interest or pleasure in doing things Not at all   Feeling down, depressed, irritable, or hopeless Not at all   Total Score PHQ 2 0       Learning Assessment:  Learning Assessment 07/17/2015   PRIMARY LEARNER Patient   HIGHEST LEVEL OF EDUCATION - PRIMARY LEARNER  GRADUATED HIGH SCHOOL OR GED   BARRIERS PRIMARY LEARNER -   CO-LEARNER CAREGIVER -   PRIMARY LANGUAGE ENGLISH   LEARNER PREFERENCE PRIMARY READING     LISTENING     DEMONSTRATION   ANSWERED BY patient Laura Roman   RELATIONSHIP SELF       Abuse Screening:  Abuse Screening Questionnaire 11/29/2018   Do you ever feel afraid of your partner? N   Are you in a relationship with someone who physically or mentally threatens you? N   Is it safe for you to go home? Y       Fall Risk  Fall Risk Assessment, last 12 mths 11/29/2018   Able to walk? Yes   Fall in past 12 months? No           Coordination of Care:  1. Have you been to the ER, urgent care clinic since your last visit?   Hospitalized since your last visit? no    2. Have you seen or consulted any other health care providers outside of the [facility] since your last visit?Include any pap smears or colon screening. No

## 2019-12-14 NOTE — ACP (Advance Care Planning) (Signed)
Advance Care Planning     General Advance Care Planning (ACP) Conversation      Date of Conversation: 12/14/2019  Conducted with: Patient with Decision Making Capacity    Healthcare Decision Maker:     Click here to complete [place] including selection of the [place] Relationship (ie "Primary")  Today we documented Decision Maker(s) consistent with Legal Next of Kin hierarchy.    Content/Action Overview:   Has NO ACP documents/care preferences - information provided, considering goals and options  Reviewed DNR/DNI and patient elects Full Code (Attempt Resuscitation)  Topics discussed: ventilation preferences, hospitalization preferences and resuscitation preferences  Additional Comments: none     Length of Voluntary ACP Conversation in minutes:  16 minutes    Laura Gruetzmacher Jene Every, MD

## 2019-12-15 MED ORDER — ERGOCALCIFEROL (VITAMIN D2) 50,000 UNIT CAP
1250 mcg (50,000 unit) | ORAL_CAPSULE | ORAL | 1 refills | Status: DC
Start: 2019-12-15 — End: 2020-12-30

## 2019-12-21 ENCOUNTER — Other Ambulatory Visit: Payer: Self-pay

## 2019-12-21 ENCOUNTER — Ambulatory Visit (INDEPENDENT_AMBULATORY_CARE_PROVIDER_SITE_OTHER): Payer: Medicare Other | Admitting: Family Medicine

## 2019-12-21 VITALS — BP 165/74 | HR 63

## 2019-12-21 DIAGNOSIS — D510 Vitamin B12 deficiency anemia due to intrinsic factor deficiency: Secondary | ICD-10-CM

## 2019-12-21 DIAGNOSIS — M542 Cervicalgia: Secondary | ICD-10-CM | POA: Diagnosis not present

## 2019-12-21 DIAGNOSIS — Z981 Arthrodesis status: Secondary | ICD-10-CM | POA: Diagnosis not present

## 2019-12-21 DIAGNOSIS — M545 Low back pain: Secondary | ICD-10-CM | POA: Diagnosis not present

## 2019-12-21 DIAGNOSIS — M47812 Spondylosis without myelopathy or radiculopathy, cervical region: Secondary | ICD-10-CM | POA: Diagnosis not present

## 2019-12-21 MED ORDER — CYANOCOBALAMIN 1000 MCG/ML IJ SOLN
1000.0000 ug | Freq: Once | INTRAMUSCULAR | Status: AC
  Administered 2019-12-21: 1000 ug via INTRAMUSCULAR

## 2019-12-21 NOTE — Progress Notes (Signed)
Patient is here for a vitamin B12 injection. Denies gastrointestinal problems. She did have some dizziness today, but working with Cardiology on blood pressures. B12 injection to RUOQ per patient request with no apparent complications. Patient advised to schedule next injection in 30 days.

## 2019-12-21 NOTE — Progress Notes (Signed)
Agree with documentation as above.  Blood pressure not well controlled.  Please see if she can come back in about 2 weeks for nurse visit.  It was still high when she was here last time but not as high as it was this time.  [name], MD

## 2019-12-22 NOTE — Progress Notes (Signed)
2 week blood pressure check scheduled on nurse schedule with patient.

## 2019-12-25 DIAGNOSIS — M4726 Other spondylosis with radiculopathy, lumbar region: Secondary | ICD-10-CM | POA: Diagnosis not present

## 2019-12-25 DIAGNOSIS — M50221 Other cervical disc displacement at C4-C5 level: Secondary | ICD-10-CM | POA: Diagnosis not present

## 2019-12-25 DIAGNOSIS — M4186 Other forms of scoliosis, lumbar region: Secondary | ICD-10-CM | POA: Diagnosis not present

## 2019-12-25 DIAGNOSIS — M542 Cervicalgia: Secondary | ICD-10-CM | POA: Diagnosis not present

## 2019-12-25 DIAGNOSIS — M4312 Spondylolisthesis, cervical region: Secondary | ICD-10-CM | POA: Diagnosis not present

## 2019-12-25 DIAGNOSIS — M47812 Spondylosis without myelopathy or radiculopathy, cervical region: Secondary | ICD-10-CM | POA: Diagnosis not present

## 2019-12-25 DIAGNOSIS — M4802 Spinal stenosis, cervical region: Secondary | ICD-10-CM | POA: Diagnosis not present

## 2019-12-25 DIAGNOSIS — M5116 Intervertebral disc disorders with radiculopathy, lumbar region: Secondary | ICD-10-CM | POA: Diagnosis not present

## 2019-12-25 DIAGNOSIS — M545 Low back pain: Secondary | ICD-10-CM | POA: Diagnosis not present

## 2019-12-27 ENCOUNTER — Encounter: Payer: Self-pay | Admitting: Neurology

## 2019-12-27 ENCOUNTER — Ambulatory Visit (INDEPENDENT_AMBULATORY_CARE_PROVIDER_SITE_OTHER): Payer: Medicare Other | Admitting: Neurology

## 2019-12-27 ENCOUNTER — Other Ambulatory Visit: Payer: Self-pay

## 2019-12-27 VITALS — BP 135/73 | HR 70 | Temp 97.3°F | Ht 66.0 in | Wt 183.0 lb

## 2019-12-27 DIAGNOSIS — R5382 Chronic fatigue, unspecified: Secondary | ICD-10-CM

## 2019-12-27 DIAGNOSIS — G250 Essential tremor: Secondary | ICD-10-CM | POA: Diagnosis not present

## 2019-12-27 DIAGNOSIS — M797 Fibromyalgia: Secondary | ICD-10-CM

## 2019-12-27 DIAGNOSIS — R413 Other amnesia: Secondary | ICD-10-CM | POA: Diagnosis not present

## 2019-12-27 NOTE — Progress Notes (Signed)
Reason for visit: Fibromyalgia, mild essential tremor, mild memory disturbance  Mia [name] is an 69 y.o. female  History of present illness:  Mia [name] is a 69 year old right-handed white female with a history of a mild memory disturbance, she is on Aricept and Namenda.  She contracted the Covid virus and required hospitalization on 06 November 2019.  The patient has had some increase in fatigue and some increased cognitive slowing since that time.  She has had ongoing pain in the neck and low back, she is followed through an orthopedic surgeon.  She had a recent MRI of the cervical and lumbar spine done through [facility] in [place].  The results of the studies are not available to me.  The patient does report some gait instability, she has not had any recent falls.  She does not use a cane.  She returns the office today for an evaluation.  She is on very low-dose Mysoline taking 50 mg at night.  Past Medical History:  Diagnosis Date  . Anal fissure   . Anemia   . Arthritis   . Blood transfusion   . C. difficile colitis   . Chest pain    a. 01/2013 MV: EF 59%, no ischemia.  . Chronic Dyspnea    a. 01/2013 Echo: EF 60-65%, Gr 1 DD, PASP 67mmHg.  Marland Kitchen COVID-19   . Diabetes mellitus   . Fibromyalgia   . Gall stones   . GERD (gastroesophageal reflux disease)    gastritis  . Hemorrhoids   . Hypertension   . Interstitial cystitis   . MCI (mild cognitive impairment) 11/25/2017  . Memory difficulty 12/14/2013  . Neuromuscular disorder ([place])    sclerosis  . Osteoporosis   . Peptic ulcer   . PONV (postoperative nausea and vomiting)   . Pseudogout   . Raynaud phenomenon   . Rectal bleeding   . Sleep apnea    a. on cpap.  Marland Kitchen SVT (supraventricular tachycardia) ([place])   . Syncope 09/10/2015  . Thyroid disease    hypothyroidism  . Tremor, essential 05/14/2016    Past Surgical History:  Procedure Laterality Date  . APPENDECTOMY    . BLADDER SURGERY     x2  . BLADDER SURGERY    .  BREAST BIOPSY    . CARDIAC CATHETERIZATION    . CARPAL TUNNEL RELEASE    . CATARACT EXTRACTION Bilateral   . CHOLECYSTECTOMY    . DILATION AND CURETTAGE OF UTERUS    . ENTEROCELE REPAIR     x2  . EYE SURGERY     retina  . hysterectomy - unknown type    . JOINT REPLACEMENT    . KNEE ARTHROPLASTY    . KNEE SURGERY     x6  . NECK SURGERY     fusion  . OOPHORECTOMY    . QUADRICEPS REPAIR Right   . RECTOCELE REPAIR     x2  . SHOULDER ARTHROSCOPY WITH SUBACROMIAL DECOMPRESSION, ROTATOR CUFF REPAIR AND BICEP TENDON REPAIR  10/06/2012   Procedure: SHOULDER ARTHROSCOPY WITH SUBACROMIAL DECOMPRESSION, ROTATOR CUFF REPAIR AND BICEP TENDON REPAIR;  Surgeon: Nita Sells, MD;  Location: [facility];  Service: Orthopedics;  Laterality: Right;  Arthroscopic  Repair  of  Subscapularis, Open Biceps Tenodesis  . SHOULDER SURGERY     bilateral- bones spur  . TONSILLECTOMY    . TOTAL KNEE ARTHROPLASTY    . TOTAL SHOULDER ARTHROPLASTY      Family History  Problem Relation Age of Onset  . Heart attack Mother   . Stroke Mother   . Diabetes Mother   . Heart disease Mother   . Colon polyps Mother   . Asthma Mother   . Breast cancer Maternal Grandmother   . Wilson's disease Maternal Grandmother   . Pancreatic cancer Maternal Grandfather   . Heart disease Father   . Heart attack Father   . Asthma Father   . Stroke Sister   . Hypertension Sister   . Rheum arthritis Sister   . Dementia Sister   . Asthma Sister   . Lupus Sister   . Asthma Sister     Social history:  reports that she has never smoked. She has never used smokeless tobacco. She reports that she does not drink alcohol or use drugs.    Allergies  Allergen Reactions  . Codeine Nausea And Vomiting  . Myrbetriq  [Mirabegron] Other (See Comments)    SEVERE HEADACHE  . Percocet [Oxycodone-Acetaminophen] Nausea And Vomiting  . Celebrex [Celecoxib] Other (See Comments)    Other reaction(s):  Other flushed  . Darvocet [Propoxyphene N-Acetaminophen] Nausea And Vomiting  . Erythromycin Nausea And Vomiting    Nausea and vomiting   . Hydrocodone Nausea And Vomiting  . Lyrica [Pregabalin] Swelling    Legs swelling   . Macrodantin [Nitrofurantoin] Nausea And Vomiting  . Toradol [Ketorolac Tromethamine] Nausea And Vomiting    Per patient, only PO form causes nausea and vomiting. Can take injection without issue  . Tramadol Nausea And Vomiting  . Verapamil Nausea And Vomiting    REACTION: intolerance    Medications:  Prior to Admission medications   Medication Sig Start Date End Date Taking? Authorizing Provider  AMBULATORY NON FORMULARY MEDICATION Medication Name: One touch ultra strips. Check fasting blood sugar in the morning and as needed.  Dx - [id number] Fax to [phone] 02/04/17  Yes Hali Marry, MD  atorvastatin (LIPITOR) 40 MG tablet TAKE 1 TABLET EVERY DAY 01/16/19  Yes Nahser, Wonda Cheng, MD  Cyanocobalamin (VITAMIN B-12 IJ) Inject 1 mL as directed every 30 (thirty) days.    Yes [provider]  denosumab (PROLIA) 60 MG/ML SOLN injection Inject 60 mg into the skin every 6 (six) months. Administer in upper arm, thigh, or abdomen   Yes [provider]  donepezil (ARICEPT) 10 MG tablet TAKE 1 TABLET BY MOUTH AT BEDTIME 06/15/19  Yes Kathrynn Ducking, MD  fluticasone [facility]) 50 MCG/ACT nasal spray Place 2 sprays into both nostrils daily. 09/14/18  Yes Emeterio Reeve, DO  gabapentin (NEURONTIN) 300 MG capsule TAKE 1 TO 2 CAPSULES BY MOUTH UP TO TWO TIMES DAILY AS NEEDED 10/24/19  Yes Hali Marry, MD  leflunomide (ARAVA) 20 MG tablet Take 20 mg by mouth daily.   Yes [provider]  losartan (COZAAR) 50 MG tablet Take 1 tablet (50 mg total) by mouth daily. Take 1 tablet in the morning and 1 tablet in evening as needed for blood pressure greater than 150 11/27/19  Yes Daune Perch, NP  memantine (NAMENDA) 10 MG tablet TAKE 1  TABLET TWICE DAILY 03/23/19  Yes Kathrynn Ducking, MD  metFORMIN (GLUCOPHAGE-XR) 500 MG 24 hr tablet Take 500 mg by mouth daily with breakfast.   Yes [provider]  metoprolol tartrate (LOPRESSOR) 100 MG tablet TAKE 1 AND 1/2 TABLETS TWICE DAILY 01/16/19  Yes Nahser, Wonda Cheng, MD  NON FORMULARY Place into the nose at bedtime. cpap machine  Yes [provider]  ondansetron (ZOFRAN) 4 MG tablet Take 1 tablet (4 mg total) by mouth daily as needed for nausea or vomiting. 11/10/19 11/09/20 Yes Little Ishikawa, MD  ONE TOUCH ULTRA TEST test strip USE TO TEST FASTING BLOOD SUGAR IN THE MORNING AND AS NEEDED 02/23/18  Yes Hali Marry, MD  pantoprazole (PROTONIX) 40 MG tablet Take 40 mg by mouth 2 (two) times daily.   Yes [provider]  polyethylene glycol powder (MIRALAX) powder Take 17 g by mouth daily as needed for mild constipation or moderate constipation.    Yes [provider]  primidone (MYSOLINE) 50 MG tablet TAKE 1 TABLET AT BEDTIME 10/16/19  Yes Hali Marry, MD  promethazine (PHENERGAN) 25 MG suppository Place 1 suppository (25 mg total) rectally every 6 (six) hours as needed for nausea. 11/10/19 11/09/20 Yes Little Ishikawa, MD  venlafaxine XR (EFFEXOR-XR) 75 MG 24 hr capsule TAKE 1 CAPSULE EVERY DAY WITH BREAKFAST 03/21/19  Yes Kathrynn Ducking, MD  zolpidem (AMBIEN) 5 MG tablet Take 1 tablet (5 mg total) by mouth at bedtime as needed for sleep. 12/05/19  Yes Hali Marry, MD    ROS:  Out of a complete 14 system review of symptoms, the patient complains only of the following symptoms, and all other reviewed systems are negative.  Fatigue Memory problems Balance issues Neck pain, back pain  Blood pressure 135/73, pulse 70, temperature (!) 97.3 F (36.3 C), height 5\' 6"  (1.676 m), weight 183 lb (83 kg).  Physical Exam  General: The patient is alert and cooperative at the time of the examination.  The patient is  mildly obese.  Skin: No significant peripheral edema is noted.   Neurologic Exam  Mental status: The patient is alert and oriented x 3 at the time of the examination. The Mini-Mental status examination done today shows a total score of 27/30.   Cranial nerves: Facial symmetry is present. Speech is normal, no aphasia or dysarthria is noted. Extraocular movements are full. Visual fields are full.  Motor: The patient has good strength in all 4 extremities.  Sensory examination: Soft touch sensation is symmetric on the face, arms, and legs.  Coordination: The patient has good finger-nose-finger and heel-to-shin bilaterally.  Gait and station: The patient has a normal gait. Tandem gait is unsteady.  Romberg is positive, the patient tends to fall over.  Reflexes: Deep tendon reflexes are symmetric, with exception there is a slight decrease in the left ankle jerk reflex as compared to the right.   Assessment/Plan:  1.  Fibromyalgia  2.  Mild essential tremor  3.  Memory disturbance, stable  4.  Recent Covid virus infection  5.  Chronic gait disorder  The patient appears to be clinically stable, but she does report some increased problems with cognitive processing and fatigue following the Covid virus infection.  This could be a long-lasting symptom for her.  She will remain on Aricept and Namenda, she is on Effexor for her fibromyalgia symptoms.  She recently had some troubles with blood pressure.  She will follow-up here in 8 months.  Greater than 50% of the visit was spent in counseling and coordination of care.  Face-to-face time with the patient was 20 minutes.   Jill Alexanders MD 12/27/2019 10:15 AM  [facility] [address] [place], [place] [address]  Phone [phone] Fax [phone]

## 2019-12-28 DIAGNOSIS — M542 Cervicalgia: Secondary | ICD-10-CM | POA: Diagnosis not present

## 2019-12-28 DIAGNOSIS — M545 Low back pain: Secondary | ICD-10-CM | POA: Diagnosis not present

## 2020-01-03 DIAGNOSIS — M5416 Radiculopathy, lumbar region: Secondary | ICD-10-CM | POA: Diagnosis not present

## 2020-01-04 ENCOUNTER — Encounter: Payer: Self-pay | Admitting: Family Medicine

## 2020-01-04 ENCOUNTER — Ambulatory Visit (INDEPENDENT_AMBULATORY_CARE_PROVIDER_SITE_OTHER): Payer: Medicare Other | Admitting: Family Medicine

## 2020-01-04 VITALS — BP 139/82 | HR 65 | Ht 66.0 in | Wt 183.0 lb

## 2020-01-04 DIAGNOSIS — I10 Essential (primary) hypertension: Secondary | ICD-10-CM

## 2020-01-04 NOTE — Progress Notes (Signed)
Patient states she is not taking Losartan twice a day, only once. She will increase to twice daily. She has a nurse appt in two weeks for B12 injection and will recheck blood pressure at that time. She will call with any issues.

## 2020-01-04 NOTE — Progress Notes (Signed)
Patient is here for blood pressure check. Last reading in our office on 12/27/2019 was 135/73. She states at home it has been running in the 150's/80-90's. Last night it was 157/93. Today in the office on first reading it was 161/71 with a heart rate of 66. After sitting for 10 minutes 139/82 with a heart rate of 65.  Patient denies any issues except ongoing fatigue from Covid 19 virus.   Patient would like to be called with any changes to medications based on blood pressure readings. Note routed to Dr. Madilyn Fireman.

## 2020-01-04 NOTE — Progress Notes (Signed)
HTN -please verify if she is taking the losartan twice a day consistently.  If she has not been out encouraged her to go ahead and make it a daily regimen.  She can either take 1 twice a day or just take to 1 time a day. If she is already doing that and please let me know as we may need to add a different medication for better blood pressure control.

## 2020-01-12 ENCOUNTER — Telehealth: Payer: Self-pay

## 2020-01-12 NOTE — Telephone Encounter (Signed)
The CDC recommends waiting at least 90 days after recovering from [place] before receiving the vaccine, especially if you received monoclonal antibodies or convalescent plasma while you were hospitalized. Once she reaches the 90 day mark she should be fine to receive the vaccine as long as she is not having any other issues at that time.

## 2020-01-12 NOTE — Telephone Encounter (Signed)
[place] advised of recommendations.

## 2020-01-12 NOTE — Telephone Encounter (Signed)
Mia [name] was in the hospital in January for [place]. She would like to get the covid vaccine. She wanted to know if enough time has passed to get the vaccine. Please advise.

## 2020-01-17 ENCOUNTER — Other Ambulatory Visit: Payer: Self-pay | Admitting: Neurology

## 2020-01-18 ENCOUNTER — Other Ambulatory Visit: Payer: Self-pay

## 2020-01-18 ENCOUNTER — Ambulatory Visit: Payer: Medicare Other | Admitting: Family Medicine

## 2020-01-18 ENCOUNTER — Other Ambulatory Visit: Payer: Self-pay | Admitting: Cardiovascular Disease

## 2020-01-18 ENCOUNTER — Encounter: Payer: Self-pay | Admitting: Family Medicine

## 2020-01-18 ENCOUNTER — Other Ambulatory Visit: Payer: Self-pay | Admitting: Rheumatology

## 2020-01-18 VITALS — BP 153/74 | HR 63

## 2020-01-18 DIAGNOSIS — D510 Vitamin B12 deficiency anemia due to intrinsic factor deficiency: Secondary | ICD-10-CM

## 2020-01-18 DIAGNOSIS — I10 Essential (primary) hypertension: Secondary | ICD-10-CM | POA: Diagnosis not present

## 2020-01-18 MED ORDER — CYANOCOBALAMIN 1000 MCG/ML IJ SOLN
1000.0000 ug | Freq: Once | INTRAMUSCULAR | Status: AC
  Administered 2020-01-18: 1000 ug via INTRAMUSCULAR

## 2020-01-18 MED ORDER — HYDROCHLOROTHIAZIDE 25 MG PO TABS: 25.0000 mg | ORAL_TABLET | Freq: Every day | ORAL | 0 refills | Status: DC

## 2020-01-18 NOTE — Progress Notes (Signed)
Patient is here for blood pressure check and B12 injection.   She denies gastrointestinal problems or dizziness. B12 injection to [facility] (per patient request) with no apparent complications. Patient advised to schedule next injection in 30 days.   Patient also reports blood pressure readings at home with systolic numbers in the [id number]. Patient's first reading today is 183/74, her second reading is 153/74. She reports no missed blood pressure doses. She is having some bilateral lower leg swelling that gets worse at night. She is currently taking Losartan 50 mg - 1 tablet twice daily and Metoprolol 100 mg - 1.5 tablets twice daily. Dr. Madilyn Fireman was consulted and hydrochlorothiazide 25 mg was added - 1 tablet in the mornings. RX sent. Patient instructed to keep a blood pressure log at home and follow up in 2 weeks for a nurse visit to evaluate blood pressure.

## 2020-01-18 NOTE — Patient Instructions (Signed)
Continue current blood pressure medications (Losartan 50 mg twice daily and Metoprolol 100 mg - 1.5 tablets twice daily) Add HCTZ 25 mg - tablet daily in the morning. This has been sent to your pharmacy. Continue to check blood pressures at home. Follow up in 2 weeks for nurse visit for blood pressure recheck. Follow up in 1 month for B12 injection.

## 2020-01-18 NOTE — Progress Notes (Signed)
Hypertension-not well controlled it also sounds like she still getting some elevated blood pressures at home.  We will add hydrochlorothiazide 25 mg she is also noted some ankle swelling so this may help with that as well.  Follow-up in 2 weeks for blood pressure check.  Pernicious anemia-B12 injection given today.

## 2020-01-19 ENCOUNTER — Telehealth: Payer: Self-pay

## 2020-01-19 NOTE — Telephone Encounter (Signed)
Mia [name] called and left a message stating she had COVID in the past. She was exposed to [place] yesterday and wanted to know if she should isolate.

## 2020-01-19 NOTE — Telephone Encounter (Signed)
Yes, if she has had a known exposure that I would recommend that she isolate at least for the next 6 days to see if she herself starts to develop any symptoms.  If at that point she is doing okay then she can take herself off of isolation.  If any point she develops any symptoms then please let us know.

## 2020-01-19 NOTE — Telephone Encounter (Signed)
Patient advised.

## 2020-01-19 NOTE — Telephone Encounter (Signed)
Last Visit: 10/09/19 Next Visit: 03/08/20 Labs: 11/16/19 Potassium 3.0 Glucose 102, BUN 6, Calcium 8.8, BMP 12/05/19 WNL  Current Dose per office note on 10/08/20: Arava 20 mg 1 tablet daily  Okay to refill per Dr. [name]

## 2020-01-23 ENCOUNTER — Encounter: Payer: Self-pay | Admitting: Physician Assistant

## 2020-01-23 ENCOUNTER — Ambulatory Visit (INDEPENDENT_AMBULATORY_CARE_PROVIDER_SITE_OTHER): Payer: Medicare Other | Admitting: Physician Assistant

## 2020-01-23 ENCOUNTER — Other Ambulatory Visit: Payer: Self-pay

## 2020-01-23 ENCOUNTER — Ambulatory Visit: Payer: Medicare Other | Admitting: Cardiology

## 2020-01-23 VITALS — BP 120/80 | HR 58 | Ht 66.0 in | Wt 187.8 lb

## 2020-01-23 DIAGNOSIS — R0602 Shortness of breath: Secondary | ICD-10-CM | POA: Diagnosis not present

## 2020-01-23 DIAGNOSIS — I10 Essential (primary) hypertension: Secondary | ICD-10-CM

## 2020-01-23 DIAGNOSIS — I471 Supraventricular tachycardia: Secondary | ICD-10-CM

## 2020-01-23 NOTE — Progress Notes (Signed)
Cardiology Office Note:    Date:  01/23/2020   ID:  Mia [name], DOB 01/01/1951, MRN [id number]  PCP:  Mia Marry, MD  Cardiologist:  Mia Moores, MD   Electrophysiologist:  None   Referring MD: Mia [name], *   Chief Complaint:  Follow-up (Blood pressure) and Shortness of Breath    Patient Profile:    Mia [name] is a 69 y.o. female with:   Non-obstructive coronary artery disease ([place] 2006)  Borderline diabetes mellitus  Hypertension    Fibromyalgia   Hyperlipidemia   Rheumatoid arthritis   Raynaud's Dz  OSA on CPAP   PSVT  Hx of COVID-19 (admx 6 days) 1.2021  Prior CV studies:  Event monitor 08/2017 Normal sinus rhythm   CPET 05/29/16 Conclusion: The interpretation of this test is limited due to submaximal effort during the exercise. BP and HR were both blunted with incremental exercise, most likely in the nature of submaximal effort. Exercise testing with gas exchange demonstrates a mildly reduced functional capacity when compared to matched sedentary norms. There is no evidence of cardiopulmonary limitation or exercise-induced bronchospasm. At peak exercise, patient appears limited due to her body habitus and deconditioning.   Event Monitor 08/2015 Normal sinus rhythm   Myoview 11/30/14 EF 57, no ischemia  Echo 11/29/14 EF 55-60, no RWMA  Cardiac catheterization 02/11/05 LAD irregs, D1, D2 irregs LCx irregs RCA irregs EF 55-60  History of Present Illness:    Mia [name] was last seen by Dr. Acie [name] in 11/2019.  She had recently recovered from COVID-19 but was still fatigued. Her BP had been uncontrolled and Amlodipine was added.  When she saw Dr. Acie [name], her BP was running low and the Amlodipine was DC'd.   She returns for follow-up.  She notes that her primary care provider recently placed her on HCTZ and increased her losartan to 50 mg twice a day.  Her blood pressures, for the most part, have been better with this.  However, her  evening blood pressures tend to increase (150s/70s).   She has not had chest discomfort.  She does note significant lower extremity swelling this past weekend.  She had gone to [place].  Elevation did help improve her symptoms.  She has noted some dyspnea with activities.  Her fatigue is gradually getting better since recovering from COVID-19.  She has not had syncope.  She sometimes feels better sleeping on an incline.  She sleeps with a CPAP.  Past Medical History:  Diagnosis Date  . Anal fissure   . Anemia   . Arthritis   . Blood transfusion   . C. difficile colitis   . Chest pain    a. 01/2013 MV: EF 59%, no ischemia.  . Chronic Dyspnea    a. 01/2013 Echo: EF 60-65%, Gr 1 DD, PASP 11mmHg.  Marland Kitchen COVID-19   . Diabetes mellitus   . Fibromyalgia   . Gall stones   . GERD (gastroesophageal reflux disease)    gastritis  . Hemorrhoids   . Hypertension   . Interstitial cystitis   . MCI (mild cognitive impairment) 11/25/2017  . Memory difficulty 12/14/2013  . Neuromuscular disorder ([place])    sclerosis  . Osteoporosis   . Peptic ulcer   . PONV (postoperative nausea and vomiting)   . Pseudogout   . Raynaud phenomenon   . Rectal bleeding   . Sleep apnea    a. on cpap.  Marland Kitchen SVT (supraventricular tachycardia) ([place])   . Syncope 09/10/2015  .  Thyroid disease    hypothyroidism  . Tremor, essential 05/14/2016    Current Medications: Current Meds  Medication Sig  . AMBULATORY NON FORMULARY MEDICATION Medication Name: One touch ultra strips. Check fasting blood sugar in the morning and as needed.  Dx - [id number] Fax to [phone]  . atorvastatin (LIPITOR) 40 MG tablet TAKE 1 TABLET EVERY DAY  . Cyanocobalamin (VITAMIN B-12 IJ) Inject 1 mL as directed every 30 (thirty) days.   Marland Kitchen denosumab (PROLIA) 60 MG/ML SOLN injection Inject 60 mg into the skin every 6 (six) months. Administer in upper arm, thigh, or abdomen  . donepezil (ARICEPT) 10 MG tablet TAKE 1 TABLET BY MOUTH AT BEDTIME  .  fluticasone (FLONASE) 50 MCG/ACT nasal spray Place 2 sprays into both nostrils daily.  Marland Kitchen gabapentin (NEURONTIN) 300 MG capsule TAKE 1 TO 2 CAPSULES BY MOUTH UP TO TWO TIMES DAILY AS NEEDED  . hydrochlorothiazide (HYDRODIURIL) 25 MG tablet Take 1 tablet (25 mg total) by mouth daily.  Marland Kitchen leflunomide (ARAVA) 20 MG tablet TAKE 1 TABLET EVERY DAY  . losartan (COZAAR) 50 MG tablet Take 1 tablet (50 mg total) by mouth daily. Take 1 tablet in the morning and 1 tablet in evening as needed for blood pressure greater than 150  . memantine (NAMENDA) 10 MG tablet TAKE 1 TABLET TWICE DAILY  . metFORMIN (GLUCOPHAGE-XR) 500 MG 24 hr tablet Take 500 mg by mouth daily with breakfast.  . metoprolol tartrate (LOPRESSOR) 100 MG tablet TAKE 1 AND 1/2 TABLETS TWICE DAILY  . NON FORMULARY Place into the nose at bedtime. cpap machine  . ondansetron (ZOFRAN) 4 MG tablet Take 1 tablet (4 mg total) by mouth daily as needed for nausea or vomiting.  . ONE TOUCH ULTRA TEST test strip USE TO TEST FASTING BLOOD SUGAR IN THE MORNING AND AS NEEDED  . pantoprazole (PROTONIX) 40 MG tablet Take 40 mg by mouth 2 (two) times daily.  . polyethylene glycol powder (MIRALAX) powder Take 17 g by mouth daily as needed for mild constipation or moderate constipation.   . primidone (MYSOLINE) 50 MG tablet TAKE 1 TABLET AT BEDTIME  . promethazine (PHENERGAN) 25 MG suppository Place 1 suppository (25 mg total) rectally every 6 (six) hours as needed for nausea.  Marland Kitchen venlafaxine XR (EFFEXOR-XR) 75 MG 24 hr capsule TAKE 1 CAPSULE EVERY DAY WITH BREAKFAST     Allergies:   Codeine, Myrbetriq  [mirabegron], Percocet [oxycodone-acetaminophen], Celebrex [celecoxib], Darvocet [propoxyphene n-acetaminophen], Erythromycin, Hydrocodone, Lyrica [pregabalin], Macrodantin [nitrofurantoin], Toradol [ketorolac tromethamine], Tramadol, and Verapamil   Social History   Tobacco Use  . Smoking status: Never Smoker  . Smokeless tobacco: Never Used  Substance Use  Topics  . Alcohol use: No    Alcohol/week: 0.0 standard drinks  . Drug use: No     Family Hx: The patient's family history includes Asthma in her father, mother, sister, and sister; Breast cancer in her maternal grandmother; Colon polyps in her mother; Dementia in her sister; Diabetes in her mother; Heart attack in her father and mother; Heart disease in her father and mother; Hypertension in her sister; Lupus in her sister; Pancreatic cancer in her maternal grandfather; Rheum arthritis in her sister; Stroke in her mother and sister; Wilson's disease in her maternal grandmother.  ROS   EKGs/Labs/Other Test Reviewed:    EKG:  EKG is  not ordered today.  The ekg ordered today demonstrates n/a  Recent Labs: 09/12/2019: TSH 1.43 11/06/2019: Magnesium 2.1 11/16/2019: ALT 22; Hemoglobin 12.4; Platelets 377  12/05/2019: BUN 15; Creatinine, Ser 0.81; Potassium 4.6; Sodium 137   Recent Lipid Panel Lab Results  Component Value Date/Time   CHOL 128 06/07/2019 09:34 AM   TRIG 97 11/07/2019 12:04 AM   HDL 52 06/07/2019 09:34 AM   CHOLHDL 2.5 06/07/2019 09:34 AM   CHOLHDL 3.2 08/28/2016 03:58 PM   LDLCALC 51 06/07/2019 09:34 AM    Physical Exam:    VS:  BP 120/80   Pulse (!) 58   Ht 5\' 6"  (1.676 m)   Wt 187 lb 12.8 oz (85.2 kg)   SpO2 96%   BMI 30.31 kg/m     Wt Readings from Last 3 Encounters:  01/23/20 187 lb 12.8 oz (85.2 kg)  01/04/20 183 lb (83 kg)  12/27/19 183 lb (83 kg)     Constitutional:      Appearance: Healthy appearance. Not in distress.  Neck:     Vascular: JVD normal.  Pulmonary:     Effort: Pulmonary effort is normal.     Breath sounds: No wheezing. No rales.  Cardiovascular:     Normal rate. Regular rhythm. Normal S1. Normal S2.     Murmurs: There is no murmur.  Edema:    Peripheral edema present.    Pretibial: bilateral trace edema of the pretibial area. Abdominal:     Palpations: Abdomen is soft. There is no hepatomegaly.  Skin:    General: Skin is warm  and dry.  Neurological:     General: No focal deficit present.     Mental Status: Alert and oriented to person, place and time.       ASSESSMENT & PLAN:    1. Shortness of breath She notes lower extremity swelling as well as dyspnea exertion.  Her lower extremity swelling is a fairly new symptom.  She does have evidence of varicose veins on exam.  Her swelling did improve with elevation.  She also notes continued fatigue since her diagnosis of COVID-19.  I suspect her shortness of breath is more than likely related to deconditioning from her recent illness.  I also suspect her lower extremity swelling is related to venous insufficiency.  She does not really have evidence of volume excess on exam.   I have advised her to take an extra HCTZ if she has worsening swelling.    -Obtain BMET, BNP  -Obtain echocardiogram to rule out LV dysfunction  2. Essential hypertension Well-controlled on exam today.  She does note elevations in the evening prior to taking her evening dose of losartan.  I have recommended that she either continue to take losartan twice a day or change losartan to 100 mg in the morning or change losartan to 100 mg in the evening.  She is welcome to try any regimen which keeps her blood pressure controlled.  Check follow-up BMP today.  3. SVT (supraventricular tachycardia) (HCC) Controlled on current therapy.  Continue metoprolol tartrate 150 mg twice daily.   Dispo:  Return in about 3 months (around 04/24/2020) for Routine Follow Up, w/ Dr. Acie [name], or Richardson Dopp, PA-C, in person.   Medication Adjustments/Labs and Tests Ordered: Current medicines are reviewed at length with the patient today.  Concerns regarding medicines are outlined above.  Tests Ordered: Orders Placed This Encounter  Procedures  . Basic metabolic panel  . Pro b natriuretic peptide (BNP)  . ECHOCARDIOGRAM COMPLETE   Medication Changes: No orders of the defined types were placed in this  encounter.   Signed, Richardson Dopp, PA-C  01/23/2020  3:48 PM    [facility] [place], [place], [place]  [address] Phone: [phone]; Fax: [phone]

## 2020-01-23 NOTE — Patient Instructions (Addendum)
Medication Instructions:   Your physician recommends that you continue on your current medications as directed. Please refer to the Current Medication list given to you today.  *If you need a refill on your cardiac medications before your next appointment, please call your pharmacy*  Lab Work:  You will have labs drawn today: BMET/BNP  If you have labs (blood work) drawn today and your tests are completely normal, you will receive your results only by: Marland Kitchen MyChart Message (if you have MyChart) OR . A paper copy in the mail If you have any lab test that is abnormal or we need to change your treatment, we will call you to review the results.  Testing/Procedures:  Your physician has requested that you have an echocardiogram. Echocardiography is a painless test that uses sound waves to create images of your heart. It provides your doctor with information about the size and shape of your heart and how well your heart's chambers and valves are working. This procedure takes approximately one hour. There are no restrictions for this procedure.  Follow-Up: At [facility], you and your health needs are our priority.  As part of our continuing mission to provide you with exceptional heart care, we have created designated Provider Care Teams.  These Care Teams include your primary Cardiologist (physician) and Advanced Practice Providers (APPs -  Physician Assistants and Nurse Practitioners) who all work together to provide you with the care you need, when you need it.  We recommend signing up for the patient portal called "MyChart".  Sign up information is provided on this After Visit Summary.  MyChart is used to connect with patients for Virtual Visits (Telemedicine).  Patients are able to view lab/test results, encounter notes, upcoming appointments, etc.  Non-urgent messages can be sent to your provider as well.   To learn more about what you can do with MyChart, go to [email or web address].     Your next appointment:   3 month(s)  The format for your next appointment:   In Person  Provider:   You may see Mertie Moores, MD or Richardson Dopp, PA-C  Other Instructions  Ok to try taking Losartan 50 mg, 1 tablet by mouth twice a day, or 100 mg (2 tablets) by mouth in the morning or at night.

## 2020-01-24 ENCOUNTER — Encounter

## 2020-01-24 LAB — BASIC METABOLIC PANEL
BUN/Creatinine Ratio: 11 — ABNORMAL LOW (ref 12–28)
BUN: 9 mg/dL (ref 8–27)
CO2: 26 mmol/L (ref 20–29)
Calcium: 10.2 mg/dL (ref 8.7–10.3)
Chloride: 97 mmol/L (ref 96–106)
Creatinine, Ser: 0.8 mg/dL (ref 0.57–1.00)
GFR calc Af Amer: 88 mL/min/{1.73_m2} (ref >59)
GFR calc non Af Amer: 76 mL/min/{1.73_m2} (ref >59)
Glucose: 93 mg/dL (ref 65–99)
Potassium: 4.6 mmol/L (ref 3.5–5.2)
Sodium: 138 mmol/L (ref 134–144)

## 2020-01-24 LAB — PRO B NATRIURETIC PEPTIDE: NT-Pro BNP: 119 pg/mL (ref 0–301)

## 2020-01-25 DIAGNOSIS — M542 Cervicalgia: Secondary | ICD-10-CM | POA: Diagnosis not present

## 2020-01-25 DIAGNOSIS — M545 Low back pain: Secondary | ICD-10-CM | POA: Diagnosis not present

## 2020-01-25 MED ORDER — JANUVIA 100 MG TABLET
100 mg | ORAL_TABLET | ORAL | 3 refills | Status: DC
Start: 2020-01-25 — End: 2021-04-23

## 2020-01-27 ENCOUNTER — Other Ambulatory Visit: Payer: Self-pay | Admitting: Rheumatology

## 2020-01-30 ENCOUNTER — Telehealth

## 2020-01-30 NOTE — Telephone Encounter (Signed)
Pt needs an order for a right breast ultrasound for her 6 month follow-up per Marliss Czar    She also needs an order for a mammo of the left breast and an ultrasound of the left breast due to pain she is having in left breast (said she has some history of left breast issues)    Any questions, calls her at [phone]

## 2020-01-31 ENCOUNTER — Encounter

## 2020-01-31 NOTE — Telephone Encounter (Signed)
Pt aware of message below and verbalized understanding. No further questions or concerns from pt at this time.

## 2020-01-31 NOTE — Telephone Encounter (Signed)
ordered

## 2020-02-01 ENCOUNTER — Ambulatory Visit: Payer: Medicare Other

## 2020-02-01 ENCOUNTER — Ambulatory Visit (HOSPITAL_COMMUNITY): Payer: Medicare Other | Attending: Cardiology

## 2020-02-01 ENCOUNTER — Encounter: Payer: Self-pay | Admitting: Physician Assistant

## 2020-02-01 ENCOUNTER — Other Ambulatory Visit: Payer: Self-pay

## 2020-02-01 DIAGNOSIS — R0602 Shortness of breath: Secondary | ICD-10-CM | POA: Diagnosis not present

## 2020-02-02 DIAGNOSIS — M545 Low back pain: Secondary | ICD-10-CM | POA: Diagnosis not present

## 2020-02-02 DIAGNOSIS — M542 Cervicalgia: Secondary | ICD-10-CM | POA: Diagnosis not present

## 2020-02-05 ENCOUNTER — Telehealth: Payer: Self-pay | Admitting: Rheumatology

## 2020-02-05 ENCOUNTER — Telehealth: Payer: Self-pay | Admitting: Cardiovascular Disease

## 2020-02-05 ENCOUNTER — Telehealth

## 2020-02-05 MED ORDER — LEFLUNOMIDE 20 MG PO TABS: 20.0000 mg | ORAL_TABLET | Freq: Every day | ORAL | 0 refills | Status: DC

## 2020-02-05 NOTE — Telephone Encounter (Signed)
She a question about medication, she also has some questions about test results she would like to discuss with Sharyn Lull.

## 2020-02-05 NOTE — Telephone Encounter (Signed)
Patient advised we received the refill request from [facility]. Patient states her copay is too costly with [employer] and would like to have the prescription sent to [facility] instead because she can get it cheaper with a good rx coupon.   Last Visit: 10/09/19 Next Visit: 03/08/20 Labs: 11/16/19 Potassium 3.0 Glucose 102, BUN 6, Calcium 8.8, BMP 12/05/19 WNL  Current Dose per office note on 10/08/20: Arava 20 mg 1 tablet daily  Okay to refill per Dr. [name]

## 2020-02-05 NOTE — Telephone Encounter (Signed)
Agree.  Recent BNP was normal.  I suspect she is deconditioned from COVID-19 or having lingering symptoms.  Her CXR was normal in Jan 2021.  So, I doubt she has fibrosis.  If dyspnea continues, she should also follow up with primary care to make sure she does not need follow up imaging. Richardson Dopp, PA-C    02/05/2020 4:36 PM

## 2020-02-05 NOTE — Telephone Encounter (Signed)
Patient states that she has some general questions for [facility] in regards to hydrochlorothiazide (HYDRODIURIL) 25 MG tablet.

## 2020-02-05 NOTE — Telephone Encounter (Signed)
Spoke with patient who states she has been having bilateral foot and ankle swelling over the last week. She states she was started on HCTZ by PCP, Dr. Madilyn Fireman and her losartan was increased to 50 mg twice daily. She reports BP readings >150 mmHg but she has not been monitoring them consistently. She asks about the results of her echo done 4/8 and if the findings would cause her to have SOB. I explained what diastolic dysfunction means and advised that she should work on low sodium diet, weight loss, and increasing exercise. She has been less active since having Covid infection in January. I asked about sodium in her diet. She reports that she does not cook regularly, eats a very small breakfast and then one big meal daily which is usually something from a restaurant. She is not currently elevating her legs more than the height of a recliner and does not wear compression stockings. She says Richardson Dopp, PA mentioned switching from HCTZ to furosemide for her swelling. I advised that she work on reducing sodium in diet, elevating her legs, and getting some compression stockings before switching medications. Encouraged her to walk daily for exercise and report back to [place] if her SOB worsens. States she is about to run out of her HCTZ and I advised her to call Dr. Madilyn Fireman for refills. I answered questions to her satisfaction and she thanked me for the call.

## 2020-02-05 NOTE — Telephone Encounter (Signed)
Patient calling in refernce to refill on Leflunomide. Patient would like to discuss this with you. Patient thinks rx was sent to [facility], and patient wants to use [facility]. Patient in [place] now, and has been without it for two weeks now. Please call to discuss.

## 2020-02-05 NOTE — Telephone Encounter (Signed)
Duplicate encounters. See additional telephone encounter dated today.

## 2020-02-05 NOTE — Telephone Encounter (Signed)
dOne

## 2020-02-05 NOTE — Telephone Encounter (Signed)
-----   Message from Leanora Cover sent at 02/02/2020  2:16 PM EDT -----  Regarding: FW: MAMMOGRAM    ----- Message -----  From: Cay Schillings  Sent: 02/02/2020   1:21 PM EDT  To: [facility] Front Office  Subject: MAMMOGRAM                                        RIGHT ULTRA SOUND NEEDS TO BE ADDED TO THE ORDER AS WELL .... [facility]

## 2020-02-07 ENCOUNTER — Telehealth

## 2020-02-07 ENCOUNTER — Encounter

## 2020-02-07 NOTE — Telephone Encounter (Signed)
Scheduling Notes    4/9 per pt,there should be a right u/s only as well,,asked her to reach out to her dr,darlyn [phone]

## 2020-02-07 NOTE — Telephone Encounter (Signed)
Pt calling, says Marliss Czar is calling her again to say they need order changed. They now want it to say Bilateral Diagnostic Mammo.    They made her appt for Friday. Can you please update the order?

## 2020-02-07 NOTE — Telephone Encounter (Signed)
Ordered

## 2020-02-07 NOTE — Telephone Encounter (Signed)
Pt notified

## 2020-02-08 ENCOUNTER — Encounter

## 2020-02-09 ENCOUNTER — Inpatient Hospital Stay: Admit: 2020-02-09 | Payer: MEDICARE | Attending: Internal Medicine | Primary: Internal Medicine

## 2020-02-09 ENCOUNTER — Encounter

## 2020-02-09 DIAGNOSIS — N644 Mastodynia: Secondary | ICD-10-CM

## 2020-02-10 ENCOUNTER — Other Ambulatory Visit: Payer: Self-pay | Admitting: Family Medicine

## 2020-02-12 NOTE — Telephone Encounter (Signed)
Per chart she is scheduled for bx on 02/21/20

## 2020-02-12 NOTE — Telephone Encounter (Signed)
pls call    Abn mammo left  Has she been scheduled for bx by Marliss Czar as yet?

## 2020-02-19 ENCOUNTER — Other Ambulatory Visit: Payer: Self-pay | Admitting: Cardiovascular Disease

## 2020-02-21 ENCOUNTER — Ambulatory Visit: Payer: Medicare Other

## 2020-02-21 ENCOUNTER — Inpatient Hospital Stay: Admit: 2020-02-21 | Payer: MEDICARE | Attending: Internal Medicine | Primary: Internal Medicine

## 2020-02-21 ENCOUNTER — Encounter

## 2020-02-21 DIAGNOSIS — R928 Other abnormal and inconclusive findings on diagnostic imaging of breast: Secondary | ICD-10-CM

## 2020-02-21 DIAGNOSIS — C50512 Malignant neoplasm of lower-outer quadrant of left female breast: Secondary | ICD-10-CM

## 2020-02-21 MED ORDER — LIDOCAINE-EPINEPHRINE 1 %-1:100,000 IJ SOLN
1 %-:00,000 | Freq: Once | INTRAMUSCULAR | Status: AC
Start: 2020-02-21 — End: 2020-02-21
  Administered 2020-02-21: 13:00:00 via SUBCUTANEOUS

## 2020-02-21 MED ORDER — LIDOCAINE (PF) 10 MG/ML (1 %) IJ SOLN
10 mg/mL (1 %) | Freq: Once | INTRAMUSCULAR | Status: AC
Start: 2020-02-21 — End: 2020-02-21
  Administered 2020-02-21: 13:00:00 via SUBCUTANEOUS

## 2020-02-22 ENCOUNTER — Telehealth: Payer: Self-pay | Admitting: Cardiovascular Disease

## 2020-02-22 ENCOUNTER — Emergency Department (HOSPITAL_COMMUNITY): Payer: Medicare Other

## 2020-02-22 ENCOUNTER — Emergency Department (HOSPITAL_BASED_OUTPATIENT_CLINIC_OR_DEPARTMENT_OTHER)
Admit: 2020-02-22 | Discharge: 2020-02-22 | Disposition: A | Discharge status: Home / Self Care | Payer: Medicare Other | Attending: Emergency Medicine | Admitting: Emergency Medicine

## 2020-02-22 ENCOUNTER — Other Ambulatory Visit: Payer: Self-pay

## 2020-02-22 ENCOUNTER — Encounter (HOSPITAL_COMMUNITY): Payer: Self-pay | Admitting: Emergency Medicine

## 2020-02-22 ENCOUNTER — Emergency Department (HOSPITAL_COMMUNITY)
Admission: EM | Admit: 2020-02-22 | Discharge: 2020-02-22 | Disposition: A | Discharge status: Home / Self Care | Payer: Medicare Other | Attending: Emergency Medicine | Admitting: Emergency Medicine

## 2020-02-22 DIAGNOSIS — Z8616 Personal history of COVID-19: Secondary | ICD-10-CM | POA: Diagnosis not present

## 2020-02-22 DIAGNOSIS — R0602 Shortness of breath: Secondary | ICD-10-CM | POA: Insufficient documentation

## 2020-02-22 DIAGNOSIS — M7989 Other specified soft tissue disorders: Secondary | ICD-10-CM

## 2020-02-22 DIAGNOSIS — R000 Tachycardia, unspecified: Secondary | ICD-10-CM | POA: Diagnosis not present

## 2020-02-22 DIAGNOSIS — R600 Localized edema: Secondary | ICD-10-CM | POA: Insufficient documentation

## 2020-02-22 DIAGNOSIS — Z79899 Other long term (current) drug therapy: Secondary | ICD-10-CM | POA: Diagnosis not present

## 2020-02-22 DIAGNOSIS — E039 Hypothyroidism, unspecified: Secondary | ICD-10-CM | POA: Insufficient documentation

## 2020-02-22 DIAGNOSIS — R531 Weakness: Secondary | ICD-10-CM | POA: Diagnosis not present

## 2020-02-22 LAB — BASIC METABOLIC PANEL
Anion gap: 12 (ref 5–15)
BUN: <5 mg/dL — ABNORMAL LOW (ref 8–23)
CO2: 26 mmol/L (ref 22–32)
Calcium: 9.5 mg/dL (ref 8.9–10.3)
Chloride: 98 mmol/L (ref 98–111)
Creatinine, Ser: 0.78 mg/dL (ref 0.44–1.00)
GFR calc Af Amer: >60 mL/min (ref >60)
GFR calc non Af Amer: >60 mL/min (ref >60)
Glucose, Bld: 122 mg/dL — ABNORMAL HIGH (ref 70–99)
Potassium: 4.2 mmol/L (ref 3.5–5.1)
Sodium: 136 mmol/L (ref 135–145)

## 2020-02-22 LAB — CBC
HCT: 35.7 % — ABNORMAL LOW (ref 36.0–46.0)
Hemoglobin: 11.1 g/dL — ABNORMAL LOW (ref 12.0–15.0)
MCH: 28.8 pg (ref 26.0–34.0)
MCHC: 31.1 g/dL (ref 30.0–36.0)
MCV: 92.5 fL (ref 80.0–100.0)
Platelets: 401 10*3/uL — ABNORMAL HIGH (ref 150–400)
RBC: 3.86 MIL/uL — ABNORMAL LOW (ref 3.87–5.11)
RDW: 14.8 % (ref 11.5–15.5)
WBC: 5.8 10*3/uL (ref 4.0–10.5)
nRBC: 0 % (ref 0.0–0.2)

## 2020-02-22 LAB — TROPONIN I (HIGH SENSITIVITY)
Troponin I (High Sensitivity): 12 ng/L (ref <18)
Troponin I (High Sensitivity): 15 ng/L (ref <18)

## 2020-02-22 LAB — BRAIN NATRIURETIC PEPTIDE: B Natriuretic Peptide: 28 pg/mL (ref 0.0–100.0)

## 2020-02-22 MED ORDER — IBUPROFEN 800 MG PO TABS
800.0000 mg | ORAL_TABLET | Freq: Once | ORAL | Status: AC
  Administered 2020-02-22: 800 mg via ORAL
  Filled 2020-02-22: qty 1

## 2020-02-22 MED ORDER — SODIUM CHLORIDE 0.9% FLUSH: 3.0000 mL | Freq: Once | INTRAVENOUS | Status: DC

## 2020-02-22 MED ORDER — IOHEXOL 350 MG/ML SOLN
80.0000 mL | Freq: Once | INTRAVENOUS | Status: AC | PRN
  Administered 2020-02-22: 80 mL via INTRAVENOUS

## 2020-02-22 MED ORDER — ACETAMINOPHEN 325 MG PO TABS: 650.0000 mg | ORAL_TABLET | Freq: Once | ORAL | Status: DC

## 2020-02-22 NOTE — Progress Notes (Signed)
VASCULAR LAB PRELIMINARY  PRELIMINARY  PRELIMINARY  PRELIMINARY  Bilateral lower extremity venous duplex completed.    Preliminary report:  See CV proc for preliminary results.   Called report to Dr. Roni Bread, [facility], RVT 02/22/2020, 2:17 PM

## 2020-02-22 NOTE — Telephone Encounter (Signed)
Patient calling in c/o swelling and SOB. She just got back from a two week trip to [place]. She said that when she was out of town and walking around more, it was causing her legs to swell more than usual. When she took her HR while walking it was 183 bpm. She then began resting more and exerting herself less.   She is now back home and reports that her HR stays around 100-150s bpm Her legs are now sensitive to the touch and feels as if they are going to burst.  She has been elevating her lower extremities but the swelling has advanced from only her feet to now swelling up her shin. She has not been wearing her compression stockings and denies any additional salt in her diet. She reports using a salt substitute. The patient does not have  Pt is audibly SOB on the phone. She was doing laundry when I called her. She states she has difficulty breathing if she is doing anything other than sitting with her feet up.   Spoke with the DOD Dr. Radford Pax who advised to send the patient to the ED to rule out PE or DVT. Pt agreeable and said her husband is available to drive her there.

## 2020-02-22 NOTE — ED Triage Notes (Signed)
Pt states she was in [place] and felt like she could not breathe around Tuesday. Noticed her heart rate was 180s. Pt reports her feet/legs have been getting increasingly swollen. Endorses intermittent chest discomfort, shortness of breath that is worse with exertion.

## 2020-02-22 NOTE — Telephone Encounter (Signed)
Agree with plans to go to ER

## 2020-02-22 NOTE — Telephone Encounter (Signed)
Agree she should go to the ED.  Richardson Dopp, PA-C 02/22/2020 13:30

## 2020-02-22 NOTE — Telephone Encounter (Signed)
Pt c/o swelling: STAT is pt has developed SOB within 24 hours  1) How much weight have you gained and in what time span? Not sure  2) If swelling, where is the swelling located? Feet and legs  3) Are you currently taking a fluid pill? no  4) Are you currently SOB? Yes when moving around  5) Do you have a log of your daily weights (if so, list)? No   6) Have you gained 3 pounds in a day or 5 pounds in a week? No   7) Have you traveled recently? Yes patient last Wed and Thursday HR started going up to 183 BP going up and down

## 2020-02-22 NOTE — ED Provider Notes (Signed)
[facility] EMERGENCY DEPARTMENT Provider Note   CSN: [id number] Arrival date & time: 02/22/20  1237     History Chief Complaint  Patient presents with  . Shortness of Breath    Mia [name] is a 69 y.o. female.  HPI 69 year old female with a history of SVT, recent echo on 02/01/2020 with an EF of 60 to 65%, fibromyalgia, COVID-19 diagnosis in January with admission, presents to the ER for worsening shortness of breath on exertion for the last week.  History provided by patient.  She states that she went to [place] to visit her daughter approximately 2 weeks ago and noticed a episode of SVT with a heart rate in the 180s.  She did not think much of it as she has a history of this and has experienced this before.  However gradually over the following days she started to experience increased shortness of breath on exertion, and gradual swelling to the legs to the point that she could not ambulate anymore.  She notes that she is not an overly active person, but can make it through grocery shopping without feeling lightheaded or short of breath.  She states that a few days ago she went to [place] and could not make it through the shopping trip and had to sit down.  She does not have a history of CHF, COPD, asthma.  She is not on blood thinners.  She states that she did drive to [place] with her husband. She does states that her father died from "atherosclerosis" at the age of 49.  She follows with [facility], states she called Dr. Acie Fredrickson this morning reporting her symptoms and she was told to go to the ER, telephone encounter notes mention rule out DVT/PE.  She denies any fevers, nausea, vomiting, chills, shortness of breath at rest, chest pain, palpitations, abdominal pain, back pain, weakness, dysuria, hematuria, diarrhea, constipation.    Past Medical History:  Diagnosis Date  . Anal fissure   . Anemia   . Arthritis   . Blood transfusion   . C. difficile colitis   . Chest pain      a. 01/2013 MV: EF 59%, no ischemia.  . Chronic Dyspnea    a. 01/2013 Echo: EF 60-65%, Gr 1 DD, PASP 89mmHg.// Echo 01/2020: EF 60-65, no RWMA, GR 1 DD, GLS -21.6%, normal RV SF, trivial MR   . COVID-19   . Diabetes mellitus   . Fibromyalgia   . Gall stones   . GERD (gastroesophageal reflux disease)    gastritis  . Hemorrhoids   . Hypertension   . Interstitial cystitis   . MCI (mild cognitive impairment) 11/25/2017  . Memory difficulty 12/14/2013  . Neuromuscular disorder ([place])    sclerosis  . Osteoporosis   . Peptic ulcer   . PONV (postoperative nausea and vomiting)   . Pseudogout   . Raynaud phenomenon   . Rectal bleeding   . Sleep apnea    a. on cpap.  Marland Kitchen SVT (supraventricular tachycardia) ([place])   . Syncope 09/10/2015  . Thyroid disease    hypothyroidism  . Tremor, essential 05/14/2016    Patient Active Problem List   Diagnosis Date Noted  . Gastroenteritis 11/07/2019  . Dehydration 11/06/2019  . Prediabetes 11/06/2019  . Gait instability 06/20/2019  . Facet arthritis of lumbar region 05/10/2018  . Hypokalemia 05/10/2018  . MCI (mild cognitive impairment) 11/25/2017  . Restrictive lung disease 07/30/2017  . Mixed hyperlipidemia 06/18/2017  . Rectocele 04/26/2017  .  Irritable bowel syndrome with both constipation and diarrhea 04/26/2017  . Osteoporosis 04/18/2017  . Trochanteric bursitis of both hips 02/24/2017  . Stress fracture of left tibia 11/05/2016  . Inflammatory arthritis 09/30/2016  . High risk medication use 09/30/2016  . History of Clostridium difficile colitis 09/30/2016  . 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 Pseudogout 09/30/2016  . DDD cervical spine status post fusion 09/30/2016  . DDD thoracic spine 09/30/2016  . H/O total knee replacement, right 09/30/2016  . Age-related osteoporosis without current pathological fracture 09/30/2016  . Tremor, essential 05/14/2016  . Right foot pain 04/14/2016  . B12 deficiency 09/10/2015  . Syncope  09/10/2015  . Cervical facet joint syndrome 09/10/2015  . Chronic fatigue 09/10/2015  . Aortic atherosclerosis ([place]) 04/01/2015  . DOE (dyspnea on exertion)   . Primary osteoarthritis of right knee 08/13/2014  . Benign head tremor 06/11/2014  . Lumbar degenerative disc disease 06/11/2014  . IFG (impaired fasting glucose) 03/09/2014  . Hemorrhoid 03/09/2014  . Retinal wrinkling, right eye 03/09/2014  . Fibromyalgia 03/09/2014  . GERD (gastroesophageal reflux disease) 03/09/2014  . History of arthroplasty of right knee 01/31/2014  . Memory difficulty 12/14/2013  . Hemorrhoids, external, thrombosed 06/22/2011  . Hypertension 03/11/2011  . SVT (supraventricular tachycardia) ([place])   . Raynaud phenomenon   . EDEMA 08/25/2010  . Nonspecific (abnormal) findings on radiological and other examination of body structure 08/25/2010  . COMPUTERIZED TOMOGRAPHY, CHEST, ABNORMAL 08/25/2010  . OSA (obstructive sleep apnea) 04/24/2009  . Allergic rhinitis 06/11/2008  . Dyspnea 06/11/2008  . 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 Chronic diastolic CHF (congestive heart failure) ([place]) 06/08/2008  . INTERSTITIAL CYSTITIS 06/08/2008    Past Surgical History:  Procedure Laterality Date  . APPENDECTOMY    . BLADDER SURGERY     x2  . BLADDER SURGERY    . BREAST BIOPSY    . CARDIAC CATHETERIZATION    . CARPAL TUNNEL RELEASE    . CATARACT EXTRACTION Bilateral   . CHOLECYSTECTOMY    . DILATION AND CURETTAGE OF UTERUS    . ENTEROCELE REPAIR     x2  . EYE SURGERY     retina  . hysterectomy - unknown type    . JOINT REPLACEMENT    . KNEE ARTHROPLASTY    . KNEE SURGERY     x6  . NECK SURGERY     fusion  . OOPHORECTOMY    . QUADRICEPS REPAIR Right   . RECTOCELE REPAIR     x2  . SHOULDER ARTHROSCOPY WITH SUBACROMIAL DECOMPRESSION, ROTATOR CUFF REPAIR AND BICEP TENDON REPAIR  10/06/2012   Procedure: SHOULDER ARTHROSCOPY WITH SUBACROMIAL DECOMPRESSION, ROTATOR CUFF REPAIR AND  BICEP TENDON REPAIR;  Surgeon: Nita Sells, MD;  Location: [facility];  Service: Orthopedics;  Laterality: Right;  Arthroscopic  Repair  of  Subscapularis, Open Biceps Tenodesis  . SHOULDER SURGERY     bilateral- bones spur  . TONSILLECTOMY    . TOTAL KNEE ARTHROPLASTY    . TOTAL SHOULDER ARTHROPLASTY       OB History   No obstetric history on file.     Family History  Problem Relation Age of Onset  . Heart attack Mother   . Stroke Mother   . Diabetes Mother   . Heart disease Mother   . Colon polyps Mother   . Asthma Mother   . Breast cancer Maternal Grandmother   . Wilson's disease Maternal Grandmother   . Pancreatic cancer Maternal Grandfather   .  Heart disease Father   . Heart attack Father   . Asthma Father   . Stroke Sister   . Hypertension Sister   . Rheum arthritis Sister   . Dementia Sister   . Asthma Sister   . Lupus Sister   . Asthma Sister     Social History   Tobacco Use  . Smoking status: Never Smoker  . Smokeless tobacco: Never Used  Substance Use Topics  . Alcohol use: No    Alcohol/week: 0.0 standard drinks  . Drug use: No    Home Medications Prior to Admission medications   Medication Sig Start Date End Date Taking? Authorizing Provider  atorvastatin (LIPITOR) 40 MG tablet TAKE 1 TABLET EVERY DAY 01/19/20  Yes Nahser, Wonda Cheng, MD  Cyanocobalamin (VITAMIN B-12 IJ) Inject 1 mL as directed every 30 (thirty) days.    Yes [provider]  denosumab (PROLIA) 60 MG/ML SOLN injection Inject 60 mg into the skin every 6 (six) months. Administer in upper arm, thigh, or abdomen   Yes [provider]  donepezil (ARICEPT) 10 MG tablet TAKE 1 TABLET BY MOUTH AT BEDTIME Patient taking differently: Take 10 mg by mouth at bedtime.  06/15/19  Yes Kathrynn Ducking, MD  fluticasone [facility]) 50 MCG/ACT nasal spray Place 2 sprays into both nostrils daily. 09/14/18  Yes Emeterio Reeve, DO  gabapentin (NEURONTIN) 300 MG  capsule TAKE 1 TO 2 CAPSULES BY MOUTH UP TO TWO TIMES DAILY AS NEEDED Patient taking differently: Take 300 mg by mouth daily.  10/24/19  Yes Hali Marry, MD  leflunomide (ARAVA) 20 MG tablet Take 1 tablet (20 mg total) by mouth daily. 02/05/20  Yes Deveshwar, Abel Presto, MD  losartan (COZAAR) 50 MG tablet Take 1 tablet (50 mg total) by mouth daily. Take 1 tablet in the morning and 1 tablet in evening as needed for blood pressure greater than 150 Patient taking differently: Take 50 mg by mouth daily.  11/27/19  Yes Daune Perch, NP  Melatonin 10 MG CAPS Take 10 capsules by mouth daily.   Yes [provider]  memantine (NAMENDA) 10 MG tablet TAKE 1 TABLET TWICE DAILY Patient taking differently: Take 10 mg by mouth 2 (two) times daily.  03/23/19  Yes Kathrynn Ducking, MD  metFORMIN (GLUCOPHAGE-XR) 500 MG 24 hr tablet Take 500 mg by mouth daily with breakfast.   Yes [provider]  metoprolol tartrate (LOPRESSOR) 100 MG tablet TAKE 1 AND 1/2 TABLETS TWICE DAILY Patient taking differently: Take 150 mg by mouth 2 (two) times daily.  02/19/20  Yes Nahser, Wonda Cheng, MD  ondansetron (ZOFRAN) 4 MG tablet Take 1 tablet (4 mg total) by mouth daily as needed for nausea or vomiting. 11/10/19 11/09/20 Yes Little Ishikawa, MD  pantoprazole (PROTONIX) 40 MG tablet Take 40 mg by mouth 2 (two) times daily.   Yes [provider]  polyethylene glycol powder (MIRALAX) powder Take 17 g by mouth daily as needed for mild constipation or moderate constipation.    Yes [provider]  primidone (MYSOLINE) 50 MG tablet TAKE 1 TABLET AT BEDTIME Patient taking differently: Take 50 mg by mouth at bedtime.  10/16/19  Yes Hali Marry, MD  venlafaxine XR (EFFEXOR-XR) 75 MG 24 hr capsule TAKE 1 CAPSULE EVERY DAY WITH BREAKFAST Patient taking differently: Take 75 mg by mouth daily with breakfast.  01/17/20  Yes Kathrynn Ducking, MD  AMBULATORY NON FORMULARY MEDICATION Medication  Name: One touch ultra strips. Check fasting  blood sugar in the morning and as needed.  Dx - [phone] Fax to [phone] 02/04/17   Hali Marry, MD  ONE TOUCH ULTRA TEST test strip USE TO TEST FASTING BLOOD SUGAR IN THE MORNING AND AS NEEDED 02/23/18   Hali Marry, MD  promethazine (PHENERGAN) 25 MG suppository Place 1 suppository (25 mg total) rectally every 6 (six) hours as needed for nausea. Patient not taking: Reported on 02/22/2020 11/10/19 11/09/20  Little Ishikawa, MD    Allergies    Codeine, Myrbetriq  [mirabegron], Percocet [oxycodone-acetaminophen], Celebrex [celecoxib], Darvocet [propoxyphene n-acetaminophen], Erythromycin, Hydrocodone, Lyrica [pregabalin], Macrodantin [nitrofurantoin], Toradol [ketorolac tromethamine], Tramadol, and Verapamil  Review of Systems   Review of Systems  Constitutional: Negative for chills and fever.  HENT: Negative for ear pain and sore throat.   Eyes: Negative for pain and visual disturbance.  Respiratory: Positive for chest tightness and shortness of breath. Negative for cough.   Cardiovascular: Positive for leg swelling. Negative for chest pain and palpitations.  Gastrointestinal: Negative for abdominal pain, diarrhea, nausea and vomiting.  Genitourinary: Negative for dysuria and hematuria.  Musculoskeletal: Negative for arthralgias and back pain.  Skin: Negative for color change and rash.  Neurological: Positive for weakness. Negative for dizziness, seizures, syncope and headaches.  Psychiatric/Behavioral: Negative for confusion.  All other systems reviewed and are negative.   Physical Exam Updated Vital Signs BP (!) 165/88   Pulse 82   Temp 98.4 F (36.9 C) (Oral)   Resp 15   Ht 5' 6.5" (1.689 m)   Wt 81.6 kg   SpO2 98%   BMI 28.62 kg/m   Physical Exam Constitutional:      General: She is not in acute distress.    Appearance: She is well-developed. She is not ill-appearing, toxic-appearing or diaphoretic.  HENT:      Head: Normocephalic and atraumatic.     Mouth/Throat:     Mouth: Mucous membranes are moist.     Pharynx: Oropharynx is clear.  Eyes:     Extraocular Movements: Extraocular movements intact.     Pupils: Pupils are equal, round, and reactive to light.  Neck:     Vascular: No JVD.  Cardiovascular:     Rate and Rhythm: Normal rate and regular rhythm.     Pulses: Normal pulses.          Radial pulses are 2+ on the right side and 2+ on the left side.       Dorsalis pedis pulses are 2+ on the right side and 2+ on the left side.     Heart sounds: Heart sounds not distant.     Comments: No unilateral swelling, positive tenderness to palpation in calfs bilaterally. Pulmonary:     Effort: Pulmonary effort is normal.     Breath sounds: Normal breath sounds. No decreased breath sounds.  Chest:     Chest wall: No tenderness.  Abdominal:     General: Bowel sounds are normal.     Palpations: Abdomen is soft.     Tenderness: There is no abdominal tenderness.  Musculoskeletal:     Cervical back: Normal range of motion.     Right lower leg: 1+ Edema present.     Left lower leg: 1+ Edema present.  Skin:    General: Skin is warm and dry.     Coloration: Skin is not cyanotic or pale.     Findings: No rash.  Neurological:     General: No focal deficit present.  Mental Status: She is alert.  Psychiatric:        Mood and Affect: Mood normal.        Behavior: Behavior normal.     ED Results / Procedures / Treatments   Labs (all labs ordered are listed, but only abnormal results are displayed) Labs Reviewed  BASIC METABOLIC PANEL - Abnormal; Notable for the following components:      Result Value   Glucose, Bld 122 (*)    BUN <5 (*)    All other components within normal limits  CBC - Abnormal; Notable for the following components:   RBC 3.86 (*)    Hemoglobin 11.1 (*)    HCT 35.7 (*)    Platelets 401 (*)    All other components within normal limits  BRAIN NATRIURETIC PEPTIDE    TROPONIN I (HIGH SENSITIVITY)  TROPONIN I (HIGH SENSITIVITY)    EKG EKG Interpretation  Date/Time:  Thursday February 22 2020 12:55:45 EDT Ventricular Rate:  102 PR Interval:  138 QRS Duration: 82 QT Interval:  348 QTC Calculation: 453 R Axis:   25 Text Interpretation: Sinus tachycardia Otherwise normal ECG Confirmed by Madalyn Rob [phone]) on 02/22/2020 4:30:13 PM   Radiology DG Chest 2 View  Result Date: 02/22/2020 CLINICAL DATA:  Shortness of breath, prior history of COVID EXAM: CHEST - 2 VIEW COMPARISON:  11/06/2019 FINDINGS: The heart size and mediastinal contours are within normal limits. Both lungs are clear. No pleural effusion or pneumothorax. Two level anterior cervical spine fusion. IMPRESSION: No acute process in the chest. Electronically Signed   By: Macy Mis M.D.   On: 02/22/2020 13:25   CT Angio Chest PE W and/or Wo Contrast  Result Date: 02/22/2020 CLINICAL DATA:  Shortness of breath for 2 days, initial encounter EXAM: CT ANGIOGRAPHY CHEST WITH CONTRAST TECHNIQUE: Multidetector CT imaging of the chest was performed using the standard protocol during bolus administration of intravenous contrast. Multiplanar CT image reconstructions and MIPs were obtained to evaluate the vascular anatomy. CONTRAST:  28mL OMNIPAQUE IOHEXOL 350 MG/ML SOLN COMPARISON:  Chest x-ray from earlier in the same day. FINDINGS: Cardiovascular: Thoracic aorta shows no aneurysmal dilatation. Scattered atherosclerotic calcifications noted. No significant cardiomegaly is seen. No pericardial effusion is noted. The pulmonary artery shows a normal branching pattern without intraluminal filling defect to suggest pulmonary embolism. Mediastinum/Nodes: Thoracic inlet is within normal limits. No sizable hilar or mediastinal adenopathy is noted. The esophagus as visualized is within normal limits. Lungs/Pleura: Lungs are well aerated bilaterally with very mild dependent atelectatic changes. Some mosaic  changes are noted likely related air trapping. No focal confluent infiltrate or effusion is seen. Upper Abdomen: Visualized upper abdomen is within normal limits. Musculoskeletal: No acute bony abnormality is noted. Review of the MIP images confirms the above findings. IMPRESSION: No evidence of pulmonary emboli. Mild dependent atelectatic changes. Aortic Atherosclerosis (ICD10-I70.0) and Emphysema (ICD10-J43.9). Electronically Signed   By: Inez Catalina M.D.   On: 02/22/2020 19:43   VAS [place] LOWER EXTREMITY VENOUS (DVT) (ONLY [facility] & [facility])  Result Date: 02/22/2020  Lower Venous DVTStudy Indications: Swelling, and Tachycardia.  Risk Factors: History of SVT. Limitations: Tight edema. Comparison Study: Prior studies done 02/15/17 and 10/27/16 are available for                   comparison. Performing Technologist: Sharion Dove RVS  Examination Guidelines: A complete evaluation includes B-mode imaging, spectral Doppler, color Doppler, and power Doppler as needed of all accessible portions of each  vessel. Bilateral testing is considered an integral part of a complete examination. Limited examinations for reoccurring indications may be performed as noted. The reflux portion of the exam is performed with the patient in reverse Trendelenburg.  +---------+---------------+---------+-----------+----------+--------------+ RIGHT    CompressibilityPhasicitySpontaneityPropertiesThrombus Aging +---------+---------------+---------+-----------+----------+--------------+ CFV      Full           Yes      Yes                                 +---------+---------------+---------+-----------+----------+--------------+ SFJ      Full                                                        +---------+---------------+---------+-----------+----------+--------------+ FV Prox  Full                                                        +---------+---------------+---------+-----------+----------+--------------+ FV Mid    Full                                                        +---------+---------------+---------+-----------+----------+--------------+ FV DistalFull                                                        +---------+---------------+---------+-----------+----------+--------------+ PFV      Full                                                        +---------+---------------+---------+-----------+----------+--------------+ POP      Full           Yes      Yes                                 +---------+---------------+---------+-----------+----------+--------------+ PTV      Full                                                        +---------+---------------+---------+-----------+----------+--------------+ PERO     Full                                                        +---------+---------------+---------+-----------+----------+--------------+   +---------+---------------+---------+-----------+----------+--------------+ LEFT     CompressibilityPhasicitySpontaneityPropertiesThrombus Aging +---------+---------------+---------+-----------+----------+--------------+  CFV      Full           Yes      Yes                                 +---------+---------------+---------+-----------+----------+--------------+ SFJ      Full                                                        +---------+---------------+---------+-----------+----------+--------------+ FV Prox  Full                                                        +---------+---------------+---------+-----------+----------+--------------+ FV Mid   Full                                                        +---------+---------------+---------+-----------+----------+--------------+ FV DistalFull                                                        +---------+---------------+---------+-----------+----------+--------------+ PFV      Full                                                         +---------+---------------+---------+-----------+----------+--------------+ POP      Full           Yes      Yes                                 +---------+---------------+---------+-----------+----------+--------------+ PTV      Full                                                        +---------+---------------+---------+-----------+----------+--------------+ PERO     Full                                                        +---------+---------------+---------+-----------+----------+--------------+     Summary: RIGHT: - Findings appear essentially unchanged compared to previous examination. - There is no evidence of deep vein thrombosis in the lower extremity.  LEFT: - Findings appear essentially unchanged compared to previous examination. - There is no evidence of deep vein thrombosis in the lower extremity.  *See table(s)  above for measurements and observations. Electronically signed by Curt Jews MD on 02/22/2020 at 4:26:37 PM.    Final     Procedures Procedures (including critical care time)  Medications Ordered in ED Medications  sodium chloride flush (NS) 0.9 % injection 3 mL (3 mLs Intravenous Not Given 02/22/20 1631)  ibuprofen (ADVIL) tablet 800 mg (800 mg Oral Given 02/22/20 1833)  iohexol (OMNIPAQUE) 350 MG/ML injection 80 mL (80 mLs Intravenous Contrast Given 02/22/20 1933)    ED Course  I have reviewed the triage vital signs and the nursing notes.  Pertinent labs & imaging results that were available during my care of the patient were reviewed by me and considered in my medical decision making (see chart for details).    MDM Rules/Calculators/A&P                     69 year old female with increased exertional shortness of breath and bilateral leg swelling x1 week. On presentation to the ER, patient is alert and oriented, no acute distress, nontoxic appearing, speaking full sentences without increased work of breathing, nondiaphoretic.   Patient is mildly hypertensive with systolics in the [id number], diastolics in the [id number], which appears to be elevated from her baseline.  However, she is not tachycardic, sats 97 on room air, other vitals reassuring.  Physical exam positive for 1-2+ pitting edema in bilateral extremities.  No other acute physical exam findings.  The emergent differential diagnosis for shortness of breath includes, but is not limited to, Pulmonary edema, bronchoconstriction, Pneumonia, Pulmonary embolism, Pneumotherax/ Hemothorax, Dysrythmia, ACS, new onset HF.   BMP without significant electrolyte abnormalities, glucose mildly elevated 122, BUN less than 5.  CBC without leukocytosis, mildly decreased hemoglobin at 11.1 which seems consistent with her previous values.  DVT study negative.  Chest x-ray without acute cardiopulmonary process.  EKG normal sinus rhythm.  Initial troponin negative.  Highest concern for PE.  Will order BNP and CT PE study as the patient does have a history of COVID-19, with worsening shortness of breath.  She does not endorse a pleuritic chest pain however.   CT PE negative.  BNP normal.  Serial troponins normal.  On reevaluation, patient notes improvement of symptoms.  Work-up overall reassuring.  Patient was seen and evaluated by Dr. Roslynn Amble, we both agree as well-appearing, and is stable for discharge with close follow-up with cardiology.  Strict return precautions given.  Patient voices understanding is agreeable to this plan.  At this stage in the ED course the patient has been medically screened and is stable for discharge. Final Clinical Impression(s) / ED Diagnoses Final diagnoses:  SOB (shortness of breath)  Leg swelling    Rx / DC Orders ED Discharge Orders    None       Lyndel Safe 02/22/20 2111    Lucrezia Starch, MD 02/24/20 [phone]

## 2020-02-22 NOTE — Discharge Instructions (Addendum)
Please make sure to follow-up with your cardiologist within the week.  Your work-up overall was reassuring in the ER.  Please return if your symptoms worsen.

## 2020-02-23 ENCOUNTER — Ambulatory Visit: Payer: Medicare Other

## 2020-02-23 ENCOUNTER — Ambulatory Visit: Payer: Medicare Other | Admitting: Family Medicine

## 2020-02-23 ENCOUNTER — Encounter: Payer: Self-pay | Admitting: Family Medicine

## 2020-02-23 ENCOUNTER — Ambulatory Visit (INDEPENDENT_AMBULATORY_CARE_PROVIDER_SITE_OTHER): Payer: Medicare Other | Admitting: Family Medicine

## 2020-02-23 ENCOUNTER — Other Ambulatory Visit: Payer: Self-pay

## 2020-02-23 ENCOUNTER — Telehealth: Payer: Self-pay | Admitting: Cardiovascular Disease

## 2020-02-23 VITALS — BP 166/71 | HR 102 | Ht 66.0 in | Wt 192.0 lb

## 2020-02-23 DIAGNOSIS — R600 Localized edema: Secondary | ICD-10-CM | POA: Diagnosis not present

## 2020-02-23 DIAGNOSIS — R002 Palpitations: Secondary | ICD-10-CM

## 2020-02-23 DIAGNOSIS — R0602 Shortness of breath: Secondary | ICD-10-CM | POA: Diagnosis not present

## 2020-02-23 DIAGNOSIS — E538 Deficiency of other specified B group vitamins: Secondary | ICD-10-CM

## 2020-02-23 DIAGNOSIS — R0600 Dyspnea, unspecified: Secondary | ICD-10-CM

## 2020-02-23 DIAGNOSIS — R0609 Other forms of dyspnea: Secondary | ICD-10-CM

## 2020-02-23 DIAGNOSIS — I10 Essential (primary) hypertension: Secondary | ICD-10-CM | POA: Diagnosis not present

## 2020-02-23 MED ORDER — CYANOCOBALAMIN 1000 MCG/ML IJ SOLN
1000.0000 ug | Freq: Once | INTRAMUSCULAR | Status: AC
  Administered 2020-02-23: 1000 ug via INTRAMUSCULAR

## 2020-02-23 MED ORDER — HYDROCHLOROTHIAZIDE 25 MG PO TABS
25.0000 mg | ORAL_TABLET | Freq: Every day | ORAL | 1 refills | Status: DC
Start: 2020-02-23 — End: 2020-03-29

## 2020-02-23 MED ORDER — FUROSEMIDE 10 MG/ML IJ SOLN
40.0000 mg | Freq: Once | INTRAMUSCULAR | Status: AC
  Administered 2020-02-23: 40 mg via INTRAMUSCULAR

## 2020-02-23 NOTE — Telephone Encounter (Signed)
Patient called to speak with Christen [facility]. I advised that she was not in today. She stated "nevermind, I'll call by regular doctor." She declined me taking a message.

## 2020-02-23 NOTE — Progress Notes (Signed)
Established Patient Office Visit  Subjective:  Patient ID: Mia [name], female    DOB: [birth year]  Age: 69 y.o. MRN: [id number]  CC:  Chief Complaint  Patient presents with  . Edema  . B12 Injection    HPI Mia [name] presents for bilateral lower ext swelling.  She actually went to the ED yesterday for lower extremity swelling and shortness of breath.  She complained of shortness of breath with exertion for approximately 1 week.  Check she just had an echocardiogram earlier this month with normal ejection fraction.  EKG just showed sinus tachycardia.  Hemoglobin was a little bit low at 11.1, similar to previous. DVT study negative.  Chest x-ray without acute cardiopulmonary process.  EKG normal sinus rhythm.   troponin negative.  Chest CT negative for pulmonary embolism. Reports her weight is up to 12 lbs.    Pernicious anemia - due for her B12 today.     Past Medical History:  Diagnosis Date  . Anal fissure   . Anemia   . Arthritis   . Blood transfusion   . C. difficile colitis   . Chest pain    a. 01/2013 MV: EF 59%, no ischemia.  . Chronic Dyspnea    a. 01/2013 Echo: EF 60-65%, Gr 1 DD, PASP 47mmHg.// Echo 01/2020: EF 60-65, no RWMA, GR 1 DD, GLS -21.6%, normal RV SF, trivial MR   . COVID-19   . Diabetes mellitus   . Fibromyalgia   . Gall stones   . GERD (gastroesophageal reflux disease)    gastritis  . Hemorrhoids   . Hypertension   . Interstitial cystitis   . MCI (mild cognitive impairment) 11/25/2017  . Memory difficulty 12/14/2013  . Neuromuscular disorder ([place])    sclerosis  . Osteoporosis   . Peptic ulcer   . PONV (postoperative nausea and vomiting)   . Pseudogout   . Raynaud phenomenon   . Rectal bleeding   . Sleep apnea    a. on cpap.  Marland Kitchen SVT (supraventricular tachycardia) ([place])   . Syncope 09/10/2015  . Thyroid disease    hypothyroidism  . Tremor, essential 05/14/2016    Past Surgical History:  Procedure Laterality Date  . APPENDECTOMY    . BLADDER  SURGERY     x2  . BLADDER SURGERY    . BREAST BIOPSY    . CARDIAC CATHETERIZATION    . CARPAL TUNNEL RELEASE    . CATARACT EXTRACTION Bilateral   . CHOLECYSTECTOMY    . DILATION AND CURETTAGE OF UTERUS    . ENTEROCELE REPAIR     x2  . EYE SURGERY     retina  . hysterectomy - unknown type    . JOINT REPLACEMENT    . KNEE ARTHROPLASTY    . KNEE SURGERY     x6  . NECK SURGERY     fusion  . OOPHORECTOMY    . QUADRICEPS REPAIR Right   . RECTOCELE REPAIR     x2  . SHOULDER ARTHROSCOPY WITH SUBACROMIAL DECOMPRESSION, ROTATOR CUFF REPAIR AND BICEP TENDON REPAIR  10/06/2012   Procedure: SHOULDER ARTHROSCOPY WITH SUBACROMIAL DECOMPRESSION, ROTATOR CUFF REPAIR AND BICEP TENDON REPAIR;  Surgeon: Nita Sells, MD;  Location: [facility];  Service: Orthopedics;  Laterality: Right;  Arthroscopic  Repair  of  Subscapularis, Open Biceps Tenodesis  . SHOULDER SURGERY     bilateral- bones spur  . TONSILLECTOMY    . TOTAL KNEE ARTHROPLASTY    .  TOTAL SHOULDER ARTHROPLASTY      Family History  Problem Relation Age of Onset  . Heart attack Mother   . Stroke Mother   . Diabetes Mother   . Heart disease Mother   . Colon polyps Mother   . Asthma Mother   . Breast cancer Maternal Grandmother   . Wilson's disease Maternal Grandmother   . Pancreatic cancer Maternal Grandfather   . Heart disease Father   . Heart attack Father   . Asthma Father   . Stroke Sister   . Hypertension Sister   . Rheum arthritis Sister   . Dementia Sister   . Asthma Sister   . Lupus Sister   . Asthma Sister     Social History   Socioeconomic History  . Marital status: Married    Spouse name: Not on file  . Number of children: 2  . Years of education: 61  . Highest education level: Not on file  Occupational History  . Occupation: Retired  Tobacco Use  . Smoking status: Never Smoker  . Smokeless tobacco: Never Used  Substance and Sexual Activity  . Alcohol use: No     Alcohol/week: 0.0 standard drinks  . Drug use: No  . Sexual activity: Not on file  Other Topics Concern  . Not on file  Social History Narrative   Patient drinks about 3-4 cups of caffeine daily.   Patient is right handed.   Social Determinants of Health   Financial Resource Strain:   . Difficulty of Paying Living Expenses:   Food Insecurity:   . Worried About Charity fundraiser in the Last Year:   . Arboriculturist in the Last Year:   Transportation Needs:   . Film/video editor (Medical):   Marland Kitchen Lack of Transportation (Non-Medical):   Physical Activity:   . Days of Exercise per Week:   . Minutes of Exercise per Session:   Stress:   . Feeling of Stress :   Social Connections:   . Frequency of Communication with Friends and Family:   . Frequency of Social Gatherings with Friends and Family:   . Attends Religious Services:   . Active Member of Clubs or Organizations:   . Attends Archivist Meetings:   Marland Kitchen Marital Status:   Intimate Partner Violence:   . Fear of Current or Ex-Partner:   . Emotionally Abused:   Marland Kitchen Physically Abused:   . Sexually Abused:     Outpatient Medications Prior to Visit  Medication Sig Dispense Refill  . AMBULATORY NON FORMULARY MEDICATION Medication Name: One touch ultra strips. Check fasting blood sugar in the morning and as needed.  Dx - [id number] Fax to [phone] 50 strip 11  . atorvastatin (LIPITOR) 40 MG tablet TAKE 1 TABLET EVERY DAY 90 tablet 3  . Cyanocobalamin (VITAMIN B-12 IJ) Inject 1 mL as directed every 30 (thirty) days.     Marland Kitchen denosumab (PROLIA) 60 MG/ML SOLN injection Inject 60 mg into the skin every 6 (six) months. Administer in upper arm, thigh, or abdomen    . donepezil (ARICEPT) 10 MG tablet TAKE 1 TABLET BY MOUTH AT BEDTIME (Patient taking differently: Take 10 mg by mouth at bedtime. ) 90 tablet 3  . fluticasone (FLONASE) 50 MCG/ACT nasal spray Place 2 sprays into both nostrils daily. 16 g 6  . gabapentin (NEURONTIN)  300 MG capsule TAKE 1 TO 2 CAPSULES BY MOUTH UP TO TWO TIMES DAILY AS NEEDED (Patient taking differently: Take  300 mg by mouth daily. ) 180 capsule 3  . leflunomide (ARAVA) 20 MG tablet Take 1 tablet (20 mg total) by mouth daily. 90 tablet 0  . losartan (COZAAR) 50 MG tablet Take 1 tablet (50 mg total) by mouth daily. Take 1 tablet in the morning and 1 tablet in evening as needed for blood pressure greater than 150 (Patient taking differently: Take 50 mg by mouth daily. ) 180 tablet 3  . Melatonin 10 MG CAPS Take 10 capsules by mouth daily.    . memantine (NAMENDA) 10 MG tablet TAKE 1 TABLET TWICE DAILY (Patient taking differently: Take 10 mg by mouth 2 (two) times daily. ) 180 tablet 4  . metFORMIN (GLUCOPHAGE-XR) 500 MG 24 hr tablet Take 500 mg by mouth daily with breakfast.    . metoprolol tartrate (LOPRESSOR) 100 MG tablet TAKE 1 AND 1/2 TABLETS TWICE DAILY (Patient taking differently: Take 150 mg by mouth 2 (two) times daily. ) 270 tablet 3  . ondansetron (ZOFRAN) 4 MG tablet Take 1 tablet (4 mg total) by mouth daily as needed for nausea or vomiting. 30 tablet 1  . ONE TOUCH ULTRA TEST test strip USE TO TEST FASTING BLOOD SUGAR IN THE MORNING AND AS NEEDED 50 each 11  . pantoprazole (PROTONIX) 40 MG tablet Take 40 mg by mouth 2 (two) times daily.    . polyethylene glycol powder (MIRALAX) powder Take 17 g by mouth daily as needed for mild constipation or moderate constipation.     . primidone (MYSOLINE) 50 MG tablet TAKE 1 TABLET AT BEDTIME (Patient taking differently: Take 50 mg by mouth at bedtime. ) 90 tablet 1  . venlafaxine XR (EFFEXOR-XR) 75 MG 24 hr capsule TAKE 1 CAPSULE EVERY DAY WITH BREAKFAST (Patient taking differently: Take 75 mg by mouth daily with breakfast. ) 90 capsule 3  . promethazine (PHENERGAN) 25 MG suppository Place 1 suppository (25 mg total) rectally every 6 (six) hours as needed for nausea. (Patient not taking: Reported on 02/22/2020) 12 suppository 1   No  facility-administered medications prior to visit.    Allergies  Allergen Reactions  . Codeine Nausea And Vomiting  . Myrbetriq  [Mirabegron] Other (See Comments)    SEVERE HEADACHE  . Percocet [Oxycodone-Acetaminophen] Nausea And Vomiting  . Celebrex [Celecoxib] Other (See Comments)    Other reaction(s): Other flushed  . Darvocet [Propoxyphene N-Acetaminophen] Nausea And Vomiting  . Erythromycin Nausea And Vomiting    Nausea and vomiting   . Hydrocodone Nausea And Vomiting  . Lyrica [Pregabalin] Swelling    Legs swelling   . Macrodantin [Nitrofurantoin] Nausea And Vomiting  . Toradol [Ketorolac Tromethamine] Nausea And Vomiting    Per patient, only PO form causes nausea and vomiting. Can take injection without issue  . Tramadol Nausea And Vomiting  . Verapamil Nausea And Vomiting    REACTION: intolerance    ROS Review of Systems    Objective:    Physical Exam  Constitutional: She is oriented to person, place, and time. She appears well-developed and well-nourished.  HENT:  Head: Normocephalic and atraumatic.  Cardiovascular: Normal rate, regular rhythm and normal heart sounds.  Pulmonary/Chest: Effort normal and breath sounds normal.  Neurological: She is alert and oriented to person, place, and time.  Skin: Skin is warm and dry.  1+ pitting edema from the knee down on the left leg and trace pitting edema around the left lower leg and ankle.  Psychiatric: She has a normal mood and affect. Her behavior is  normal.    BP (!) 166/71   Pulse (!) 102   Ht 5\' 6"  (1.676 m)   Wt 192 lb (87.1 kg)   SpO2 98%   BMI 30.99 kg/m  Wt Readings from Last 3 Encounters:  02/23/20 192 lb (87.1 kg)  02/22/20 180 lb (81.6 kg)  01/23/20 187 lb 12.8 oz (85.2 kg)     Health Maintenance Due  Topic Date Due  . COVID-19 Vaccine (1) Never done  . HEMOGLOBIN A1C  09/13/2019  . FOOT EXAM  11/15/2019    There are no preventive care reminders to display for this patient.  Lab  Results  Component Value Date   TSH 1.43 09/12/2019   Lab Results  Component Value Date   WBC 5.8 02/22/2020   HGB 11.1 (L) 02/22/2020   HCT 35.7 (L) 02/22/2020   MCV 92.5 02/22/2020   PLT 401 (H) 02/22/2020   Lab Results  Component Value Date   NA 136 02/22/2020   K 4.2 02/22/2020   CO2 26 02/22/2020   GLUCOSE 122 (H) 02/22/2020   BUN <5 (L) 02/22/2020   CREATININE 0.78 02/22/2020   BILITOT 0.4 11/16/2019   ALKPHOS 84 11/16/2019   AST 30 11/16/2019   ALT 22 11/16/2019   PROT 7.2 11/16/2019   ALBUMIN 3.7 11/16/2019   CALCIUM 9.5 02/22/2020   ANIONGAP 12 02/22/2020   GFR 71.66 04/03/2015   Lab Results  Component Value Date   CHOL 128 06/07/2019   Lab Results  Component Value Date   HDL 52 06/07/2019   Lab Results  Component Value Date   LDLCALC 51 06/07/2019   Lab Results  Component Value Date   TRIG 97 11/07/2019   Lab Results  Component Value Date   CHOLHDL 2.5 06/07/2019   Lab Results  Component Value Date   HGBA1C 5.9 (A) 03/13/2019      Assessment & Plan:   Problem List Items Addressed This Visit      Cardiovascular and Mediastinum   Hypertension    Restart HCTZ, to help with BP and swelling.  Will monitor Cr/K+       Relevant Medications   hydrochlorothiazide (HYDRODIURIL) 25 MG tablet     Other   DOE (dyspnea on exertion)   B12 deficiency    Other Visit Diagnoses    Lower extremity edema    -  Primary   Relevant Medications   furosemide (LASIX) injection 40 mg (Completed)   Other Relevant Orders   LONG TERM MONITOR-LIVE TELEMETRY (3-14 DAYS)   ECHOCARDIOGRAM COMPLETE   SOB (shortness of breath)       Relevant Orders   LONG TERM MONITOR-LIVE TELEMETRY (3-14 DAYS)   ECHOCARDIOGRAM COMPLETE   Palpitations       Relevant Orders   LONG TERM MONITOR-LIVE TELEMETRY (3-14 DAYS)   ECHOCARDIOGRAM COMPLETE     Palpitations with exertion-we will get her scheduled for a Zio patch monitor for further work-up.  May just be sinus  tachycardia but could also be A. fib especially with the recent onset of shortness of breath and lower extremity swelling.  Shortness of breath-consider could be related to Covid infection as we are seeing some long-term chronic conditions from having had Covid.  Also could be cardiac related even though she just had an echocardiogram 3 weeks ago with recent onset lower extremity swelling with ruling out DVT I think it is reasonable to repeat echocardiogram to rule out heart failure she did have some known diastolic dysfunction  previously.  Lower extremity edema-we will go ahead and give her an injection of Lasix today to try to pull some the fluid off on hopeful that that will also lower her blood pressure which I think is probably contributing to the swelling as well as her long distance car ride 2 days ago though she was already noticing some shortness of breath and swelling even prior to travel.  She also ran out of her hydrochlorothiazide so encouraged her to pick that up today and go ahead and start that back tomorrow morning.   Meds ordered this encounter  Medications  . cyanocobalamin ((VITAMIN B-12)) injection 1,000 mcg  . hydrochlorothiazide (HYDRODIURIL) 25 MG tablet    Sig: Take 1 tablet (25 mg total) by mouth daily.    Dispense:  30 tablet    Refill:  1  . furosemide (LASIX) injection 40 mg    Follow-up: Return in about 2 weeks (around 03/08/2020) for swelling.    [name], MD

## 2020-02-23 NOTE — Progress Notes (Signed)
Pt came in today for B12 injection. Injection given in RUOQ tolerated well.  Pt reports no negative side effects from medication. Denies any dizziness, chest pain or palpitations, and no GI problems..  F/u in 1 month for next injection

## 2020-02-23 NOTE — Patient Instructions (Signed)
Restart your hydrochlorothiazide tomorrow morning.  I sent over a new prescription today.  Try to keep an eye on your blood pressures daily to make sure that they are trending back down into the 120s or 130s.

## 2020-02-23 NOTE — Assessment & Plan Note (Signed)
Restart HCTZ, to help with BP and swelling.  Will monitor Cr/K+

## 2020-02-26 ENCOUNTER — Telehealth: Payer: Self-pay

## 2020-02-26 NOTE — Telephone Encounter (Signed)
Patient advised.

## 2020-02-26 NOTE — Telephone Encounter (Signed)
Mia [name] called and states she is on the way to the dentist to have a tooth pulled. She wanted to to know if Dr Madilyn Fireman recommends the procedure.   Also she wanted to know if Dr Madilyn Fireman thinks it is a good time to schedule the COVID-19 vaccine.

## 2020-02-26 NOTE — Telephone Encounter (Signed)
Okay for dental extraction.  Okay to have Covid vaccine.

## 2020-02-28 ENCOUNTER — Ambulatory Visit: Admit: 2020-02-28 | Discharge: 2020-02-28 | Payer: MEDICARE | Attending: Surgery | Primary: Internal Medicine

## 2020-02-28 ENCOUNTER — Ambulatory Visit: Attending: Surgery | Primary: Internal Medicine

## 2020-02-28 DIAGNOSIS — C50512 Malignant neoplasm of lower-outer quadrant of left female breast: Secondary | ICD-10-CM

## 2020-02-28 DIAGNOSIS — M542 Cervicalgia: Secondary | ICD-10-CM | POA: Diagnosis not present

## 2020-02-28 DIAGNOSIS — M545 Low back pain: Secondary | ICD-10-CM | POA: Diagnosis not present

## 2020-02-28 DIAGNOSIS — Z171 Estrogen receptor negative status [ER-]: Secondary | ICD-10-CM

## 2020-02-28 NOTE — Progress Notes (Signed)
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 Laura Roman is being referred to Dr. Sula Rumple for medical oncology. SHe has a strong support system that includes her husband and a close group of seven girlfriends. Patient was assessed for the following barriers:    Communication:       Yes    No  -Ability to read/write,understand English       [x]       []     -Ability to talk with family/children,friends about diagnosis     [x]       []     -Other:  Comments/Referrals/Services provided: NA  Employment/Financial/Legal:     Yes    No  -Loss of employment/income         []       [x]     -Insurance coverage          [x]       []      -Needs help applying for Social Security/Disability/FMLA     []       [x]      -Help with Co-Pays for office visits         []       [x]     -Medication assistance/medical supplies or equipment     []       [x]     -Difficulty paying bills: utilities/housing       []       [x]     -Means to buy food                     [x]       []     -Legal issues           []       [x]     -Other:  Comments/Referrals/Services provided: NA  Psychosocial        Yes    No  Housing:  -Homeless           []       [x]     -Extended care needs: long term care/home care/Hospice     []       [x]     -Other:  Comments/Referrals/Services provided: NA  Transportation:       Yes    No  -Lack of vehicle or public transportation options      []       [x]     -Funds need for public transportation: bus/taxi      []       [x]     -Other:  Comments/Referrals/Services provided: NA  Support system:       Yes No  -Family members at home: spouse/significant other/children    [x]       []     -Has a support system         [x]       []     Other:  Comments/Referrals/Services provided: NA  Spirituality:        Yes No  -Spiritual issues          []       [x]     -Cultural concerns          []       [x]     -Other:  -Comments/Referrals/Services provided: NA  Sexuality:        Yes No  -Body image concerns                      []       [x]     -  Relationships/significant other issues        []       [x]     -Other:  Comments/Referrals/Services provided: NA   Coping:        Yes    No  -Able to manage emotions         [x]       []     -Feeling fearful or anxious         [x]       []     -Interest in attending support groups        []       [x]     -Cancer related pain/control         []       [x]     -Other:  Comments/Referrals/Services provided: NA  Tobacco dependency:      Yes No  -Currently smokes: cigarettes/cigars        []       [x]     -Uses smokeless tobacco         []       [x]     -Other:  Comments/Referrals/Services provided: NA    Education/review of Disease process and Management      Yes No  -Explanation of navigator role/contact information      [x]       []     New Patient Guide for Breast Cancer provided                      [x]      []          -Pathology/Staging          []       [x]     -Diagnostic tests          []       [x]     -Genetic testing needed         [x]       []     -Treatment options/plan: surgery/chemotherapy/radiation     []       [x]     -Mediport placement as needed                              []       [x]     -Tobacco cessation as needed        []       [x]     -Need for a second opinion         []       [x]     -Importance of bringing family/friends to medical appts     []       [x]    -Other:  Patient/family verbalized understanding of treatment plan: Yes.          NCCN Distress Tool    Laura Roman  was assessed for management of distress using the NCCN Distress Thermometer for Patients tool.      Mild to moderate distress  Scoring:  0-4        Yes    No    -Practical/physical issues       [x]       []       -Provide community resources      [x]       []       -Provide list of support groups      [x]       []       -Provide list of national organizations and websites               [  x]      []       -Provide ongoing supportive care by oncology medical team        [x]       []                   Comments/Referrals/Services provided:   Yes.    Moderate to severe distress  Scoring:  5 or greater                   Yes    No    -Navigator consulted with MD      []       [x]      -Navigator follow up         [x]       []      -Spiritual concerns        []       [x]      -Emotional/family concerns       []       [x]      -Refer to social worker/mental health professional/PCP   [x]       []       Comments/Referral/Services provided:  Yes.

## 2020-02-28 NOTE — Progress Notes (Signed)
Breast Cancer Consult      Ms. Laura Roman is a 69 year old woman who was referred for left cT2N0 triple negative breast cancer, s/p core biopsy 02/21/20. The area of concern was identified on diagnostic imaging obtained to follow probably benign right (contralateral) findings and left breast pain. She previously had a separate BIRADS 3 area in the right breast which had remained stable over two years. She denies palpable mass. She denies nipple discharge.There is no family history of breast cancer. Her most recent previous mammogram was 01/26/19.     She reports a personal history of uterine cancer for which she underwent surgery per Dr. Squatrito.  Chemotherapy and radiation were not recommended.     Breast/GYN history:    OB History    No obstetric history on file.          Past Medical History:   Diagnosis Date   ??? Allergic rhinitis    ??? Anxiety state, unspecified    ??? Arthritis     Dr. Wardell, Dr. Orlowski   ??? Compression fx, thoracic spine (HCC) 2007    negative DEXA Dr. Orlowski   ??? Diabetes (HCC) 10/2017    on basis of fbs>125; intol metformin   ??? Dyslipidemia    ??? Dyspepsia    ??? Endometrial ca (HCC) 02/2017    Dr Squatrito; stage 1B gr 1 endometroid adenoca w foci squamous diff of the endometrium; RA TLH BSO PLND, 0/13LN, MSI intact   ??? Frozen shoulder     right Dr. Fithian megative MRI   ??? Left thyroid nodule 04/2016    1cm nodule; apparently noted on CT 2008 and unchanged   ??? Multiple lung nodules     no change 10/06, 03/07, 03/08   ??? Overweight (BMI 25.0-29.9)     IF 7/18 start weight 168 lbs not doing    ??? Painless hematuria 04/2016    Dr Williams, neg eval   ??? Palpitations 2008    neg thallium 2008, nl holter 2008, echo nl lv/ef 65%/tr mr/dd/nl pasp   ??? Syncope     neurocardiogenic by tilt 1994   ??? Venous insufficiency    ??? Vitamin D deficiency        Past Surgical History:   Procedure Laterality Date   ??? ECHO 2D ADULT  12/04    normal with EF 70%   ??? HX COLONOSCOPY      Dr Ramirez 2007 neg; 04/22/18 neg    ??? HX GYN      s/p BTL    ??? HX HEMORRHOIDECTOMY      Dr. Ramirez 2007   ??? HX HYSTERECTOMY  03/11/2017    Dr Squatrito   ??? HX ORTHOPAEDIC      DEXA -0.5 spine, -0.8 hip (1/18)   ??? HX UROLOGICAL  06/2016    Dr Williams; bladder bx showed benign lesions   ??? PR CARDIAC SURG PROCEDURE UNLIST      thallium 2005 neg; NST 1/17 negative ef 75%   ??? US ABD COMP  9/05    negative   ??? VAS CAROTID DUPLEX BILATERAL  2004    negative       Current Outpatient Medications on File Prior to Visit   Medication Sig Dispense Refill   ??? Januvia 100 mg tablet take 1 tablet by mouth once daily 90 Tab 3   ??? ergocalciferol (Vitamin D2) 1,250 mcg (50,000 unit) capsule Take 1 Cap by mouth every month. 12 Cap 1   ???   multivitamin (ONE A DAY) tablet Take 1 Tab by mouth daily.     ??? citalopram (CELEXA) 10 mg tablet take 1 tablet by mouth once daily 90 Tab 3   ??? atorvastatin (LIPITOR) 40 mg tablet take 1 tablet by mouth once daily 90 Tab 3   ??? lansoprazole (PREVACID) 30 mg capsule take 1 capsule by mouth BEFORE BREAKFAST DAILY 90 Cap 3   ??? Blood-Glucose Meter monitoring kit Use daily as directed 1 Kit 0   ??? glucose blood VI test strips (BLOOD GLUCOSE TEST) strip Use daily as directed 100 Strip 11   ??? lancets misc Use daily as directed 100 Each 11   ??? omega-3 fatty acids (FISH OIL) cap Take 2 Tabs by mouth daily.     ??? aspirin 81 mg chewable tablet Take 81 mg by mouth daily.       No current facility-administered medications on file prior to visit.        Allergies   Allergen Reactions   ??? Fish Oil Nausea Only     Patient denies   ??? Metformin Diarrhea   ??? Relafen [Nabumetone] Nausea Only       Social History     Tobacco Use   ??? Smoking status: Never Smoker   ??? Smokeless tobacco: Never Used   Substance Use Topics   ??? Alcohol use: Yes     Alcohol/week: 4.0 standard drinks     Types: 4 Glasses of wine per week   ??? Drug use: No       Family History   Problem Relation Age of Onset   ??? Diabetes Mother    ??? Elevated Lipids Father    ??? Stroke Paternal  Grandmother    ??? Heart Disease Paternal Grandfather          ROS: positives are bolded  General: fevers, chills, night sweats, fatigue, weight loss, weight gain  GI: nausea, vomiting, abdominal pain, change in bowel habits, hematochezia, melena, GERD  Integ: dermatitis, abnormal moles  HEENT: visual changes, vertigo, epistaxis, dysphagia, hoarseness  Cardiac: chest pain, palpitations, HTN, edema, varicosities  Resp: cough, shortness of breath, wheezing, hemoptysis, snoring, reactive airway disease  GU: hematuria, dysuria, frequency, urgency, nocturia, stress urinary incontinence   MSK: weakness, joint pain, arthritis  Endocrine:  thyroid disease, polyuria, polydipsia, polyphagia, poor wound healing, heat intolerance, cold intolerance  Lymph/Heme: anemia, bruising, history of blood transfusions  Neuro: dizziness, headache, fainting, seizures, stroke  Psych: anxiety, depression    Physical Exam:  Visit Vitals  BP 128/78   Temp 97.4 ??F (36.3 ??C) (Skin)   Resp 16   Ht 5' 4" (1.626 m)   Wt 74.4 kg (164 lb)   BMI 28.15 kg/m??       Gen:  No distress  Head: normocephalic, atraumatic  Mouth: Clear, no overt lesions, oral mucosa pink and moist  Neck: supple, no masses, no adenopathy, trachea midline  Resp: clear bilaterally  Cardio: Regular rate and rhythm  Abdomen: soft, nontender, nondistended  Extremities: warm, well-perfused  Neuro: sensation and strength grossly intact and symmetrical  Psych: alert and oriented to person, place and time  Breasts:   Right: Examined in both the supine and upright positions.  There was no supraclavicular, infraclavicular, or axillary lymphadenopathy.   There were no dominant masses, no skin changes, no asymmetry identified   Left: Examined in both the supine and upright positions.  There was no supraclavicular, infraclavicular, or axillary lymphadenopathy.   There were no dominant masses,   no skin changes, no asymmetry identified core biopsy wound clean, ecchymosis and nodularity lower  outer, hematoma vs residual mass      Imaging:  02/09/20 bilateral mammogram/ultrasound  Recommend ultrasound-guided core biopsy of 1.7 cm left breast mass. Findings and  recommendations were discussed with the patient. Patient may be seen by breast  Center staff for further coordination of care.  ??  Borderline prominent lymph nodes in the left.  ??  Stable probably benign findings in the right breast. Recommend additional one  year follow-up diagnostic mammogram and ultrasound to document two-year  stability.  ??  Thank you for this referral.    Pathology:  02/21/20   LEFT BREAST, 4 O'CLOCK POSITION, CORE BIOPSY:   INVASIVE ADENOCARCINOMA, NUCLEAR GRADE 3.    Estrogen Receptor (ER): Negative (0%, Internal controls present and stain as expected).   Progesterone Receptor (PgR): Negative (0%, Internal controls present and stain as expected).   Her-2 (IHC Method): Negative (score 0)   Percentage of cells with intense complete membrane staining: 0%     Impression:  Patient Active Problem List   Diagnosis Code   ??? Vitamin D deficiency E55.9   ??? Anxiety F41.9   ??? Dyslipidemia E78.5   ??? 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 Overweight with body mass index (BMI) of 25 to 25.9 in adult E66.3, Z68.25   ??? Controlled type 2 diabetes mellitus, without long-term current use of insulin (HCC) E11.9   ??? Breast cancer of lower-outer quadrant of left female breast (HCC) C50.512          68 year old woman with left cT2N0 triple negative breast cancer, s/p core biopsy 02/21/20.        We discussed that there are many options for treatment of breast cancer.  Surgery, chemotherapy, radiation and hormone therapy are all tools that may be used in the treatment of breast cancer.  For each patient, we determine which will be most beneficial based on her individual set of circumstances.  For some patients all four treatment categories will be recommended.  For others one or more of these options are not appropriate.    We began by reviewing her  imaging thus far as well as pathology in detail.      Chemotherapy was addressed.  Often chemotherapy is administered after surgery, however due to triple negative status, Ms. Chole L Serna may benefit from neoadjuvant chemotherapy or treatment prior to surgery.  Chemotherapy is systemic treatment aimed largely to decrease chance of spread or recurrence of cancer.  It may also decrease the size of her cancer to facilitate surgery.  It is administered by a medical oncologist.  I have recommended referral to medical oncology for additional information regarding chemotherapy. Chemotherapy is generally administered via port.        Regarding surgery, there are two main options, lumpectomy or mastectomy.  We discussed both in detail.  Overall survival and distant recurrence rates are the same. The decision is generally a personal decision more so than a medical one.     Lumpectomy is also known as breast conservation surgery or partial mastectomy. The goal is to remove the area of concern as well as surrounding area of uninvolved tissue ("clear margins").   Radiation is almost always recommended with lumpectomy to allow for acceptable local recurrence rates.  Local recurrence rates are approximately 6% after lumpectomy with radiation.  Risks, benefits and options were discussed in detail to include, but not limited to, bleeding, infection, risks   of anesthesia, injury to surrounding structures and other unforeseen events such as stroke, heart attack or death.      Mastectomy was then addressed.  With mastectomy, almost all of the breast tissue is removed.  We are not able to remove 100% of the breast tissue.  The risk of local recurrence is approximately 2-4% after mastectomy.  The overlying skin is generally numb.  Most often, the numbness is permanent.  Mastectomy can be performed with or without reconstruction.  The reconstruction is performed by the plastic surgeon.  Commonly it is a multi-step process with placement  of tissue expanders as the first step. If she is interested in reconstruction, I will refer her to plastic surgery.  Often we are able to perform a nipple sparing mastectomy with reconstruction.  The reconstructed breast differs in many ways from the native breast.  The goal is that in a bra or clothing, no one can tell she has had a mastectomy.  Radiation generally is not needed after mastectomy.  There are some circumstances, usually based on size, margins, local extension or lymph node status, where post-mastectomy radiation is recommended.   Risks, benefits and options were discussed in detail to include, but not limited to, bleeding, infection, risks of anesthesia, injury to surrounding structures and other unforeseen events such as stroke, heart attack or death. Routine screening mammogram is not recommended after mastectomy.      As she is clinically node negative, she is a candidate for sentinel node biopsy. We discussed this procedure is a targeted sampling of the axillary lymph nodes to allow for staging of her disease.  She will be injected with radioactive isotope as well as blue dye to allow for mapping and removal of the sentinel nodes.  By removing only the sentinel nodes and not performing axillary node dissection, she has a decreased risk of lymphedema (arm swelling) and nerve injury.  We did specifically discuss that both of these risks still exist.   Risks, benefits and options were discussed in detail to include, but not limited to, bleeding, infection, risks of anesthesia, injury to surrounding structures and other unforeseen events such as stroke, heart attack or death.    If a significant amount of cancer is noted in her lymph nodes, an axillary lymph node dissection may be recommended.  In this procedure more, but not all, of the lymph nodes under the arm are removed.  The chance of both lymphedema and nerve injury are increased with axillary node dissection compared to sentinel node biopsy.  I generally recommend referral to physical therapy and a lymphedema specialist if an axillary node dissection is performed.     We discussed radiation.  Radiation is a local therapy aimed to decrease the chance of local recurrence.  It is administered under the direction of a radiation oncologist.  Radiation is almost always recommended with lumpectomy.  It generally is not needed after mastectomy.  There are some circumstances, usually based on size, margins, local extension or lymph node status, where post-mastectomy radiation is recommended.  Most commonly it is administered five days a week for up to seven weeks.  Most of the side effects, with the exception of fatigue, are local.      Anti-hormone or hormone blocking therapy is used in hormone sensitive, estrogen receptor positive breast cancer.  It is generally recommended for 5-10 years.  There are two categories of hormone blocking medications.  Tamoxifen is a selective estrogen reuptake modulator (SERM).  There are several   aromatase inhibitors.  These medications are generally prescribed by a medical oncologist.     We discussed genetic testing prior to proceeding with surgery. We discussed genetic testing in detail.   Health insurance should not be significantly affected by the results, though life and disability insurance may be.  Often these would be affected by a breast cancer diagnosis, but it may be more difficult to obtain with a known personal genetic mutation. Those pursuing genetic testing may consider obtaining or maximizing benefits prior to proceeding with genetic testing.  If she is discovered to harbor a germline mutation, bilateral mastectomy with or without reconstruction will be recommended as her operation.         We will schedule medical oncology consulation.  All questions were answered.  She was asked to call with any additional questions or concerns.

## 2020-02-28 NOTE — Progress Notes (Signed)
02/27/20 Initial meeting to discuss recent breast biopsy results.  Laura Roman is diagnosed with Triple Negative Invasive Ductal Carcinoma.  Dr. Dois Davenport reviewed results with patient and answered all questions to patient's satisfaction.  I provided emotional support, a patient guide with printed copy of pathology results, and additional breast cancer education.  Surgical consult scheduled with Dr. Wyonia Hough on 02/28/20.  Patient encouraged to contact me with any questions or concerns.

## 2020-02-28 NOTE — Progress Notes (Signed)
Oncology Social Work Follow-up Note   Patient name: Laura Roman   MRN: [id number]   Date of Birth: 1950-10-28     02/28/2020    MSW received referral from nurse navigator.  Patient has no needs at this time-MSW will provide letter of introduction and information on support services provided by MSW.  Information to be provided by [employer] (mail).  MSW will provide support and assistance as needed and/or requested.     Faren A. Albertina Parr, MSW, LMSW

## 2020-02-28 NOTE — Progress Notes (Signed)
Breast Cancer Consult      Laura Roman is a 69 year old woman who was referred for left cT2N0 triple negative breast cancer, s/p core biopsy 02/21/20. The area of concern was identified on diagnostic imaging obtained to follow probably benign right (contralateral) findings and left breast pain. She previously had a separate BIRADS 3 area in the right breast which had remained stable over two years. She denies palpable mass. She denies nipple discharge.There is no family history of breast cancer. Her most recent previous mammogram was 01/26/19.     She reports a personal history of uterine cancer for which she underwent surgery per Dr. Sheral Flow.  Chemotherapy and radiation were not recommended.     Breast/GYN history:    OB History    No obstetric history on file.          Past Medical History:   Diagnosis Date   ??? Allergic rhinitis    ??? Anxiety state, unspecified    ??? Arthritis     Dr. Synetta Shadow, Dr. Marisa Hua   ??? Compression fx, thoracic spine [facility]) 2007    negative DEXA Dr. Marisa Hua   ??? Diabetes ([place]) 10/2017    on basis of fbs>125; intol metformin   ??? Dyslipidemia    ??? Dyspepsia    ??? 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 Frozen shoulder     right Dr. Wynonia Hazard MRI   ??? Left thyroid nodule 04/2016    1cm nodule; apparently noted on CT 2008 and unchanged   ??? Multiple lung nodules     no change 10/06, 03/07, 03/08   ??? Overweight (BMI 25.0-29.9)     IF 7/18 start weight 168 lbs not doing    ??? Painless hematuria 04/2016    Dr Jimmye Norman, neg eval   ??? Palpitations 2008    neg thallium 2008, nl holter 2008, echo nl lv/ef 65%/tr mr/dd/nl pasp   ??? Syncope     neurocardiogenic by tilt 1994   ??? Venous insufficiency    ??? Vitamin D deficiency        Past Surgical History:   Procedure Laterality Date   ??? ECHO 2D ADULT  12/04    normal with EF 70%   ??? HX COLONOSCOPY      Dr Rosendo Gros 2007 neg; 04/22/18 neg    ??? HX GYN      s/p BTL    ??? HX HEMORRHOIDECTOMY      Dr. Rosendo Gros 2007   ??? HX HYSTERECTOMY  03/11/2017    Dr Sheral Flow   ??? HX ORTHOPAEDIC      DEXA -0.5 spine, -0.8 hip (1/18)   ??? HX UROLOGICAL  06/2016    Dr Jimmye Norman; bladder bx showed benign lesions   ??? PR CARDIAC SURG PROCEDURE UNLIST      thallium 2005 neg; NST 1/17 negative ef 75%   ??? [place] ABD COMP  9/05    negative   ??? VAS CAROTID DUPLEX BILATERAL  2004    negative       Current Outpatient Medications on File Prior to Visit   Medication Sig Dispense Refill   ??? Januvia 100 mg tablet take 1 tablet by mouth once daily 90 Tab 3   ??? ergocalciferol (Vitamin D2) 1,250 mcg (50,000 unit) capsule Take 1 Cap by mouth every month. 12 Cap 1   ???  multivitamin (ONE A DAY) tablet Take 1 Tab by mouth daily.     ??? citalopram (CELEXA) 10 mg tablet take 1 tablet by mouth once daily 90 Tab 3   ??? atorvastatin (LIPITOR) 40 mg tablet take 1 tablet by mouth once daily 90 Tab 3   ??? lansoprazole (PREVACID) 30 mg capsule take 1 capsule by mouth BEFORE BREAKFAST DAILY 90 Cap 3   ??? Blood-Glucose Meter monitoring kit Use daily as directed 1 Kit 0   ??? glucose blood VI test strips (BLOOD GLUCOSE TEST) strip Use daily as directed 100 Strip 11   ??? lancets misc Use daily as directed 100 Each 11   ??? omega-3 fatty acids (FISH OIL) cap Take 2 Tabs by mouth daily.     ??? aspirin 81 mg chewable tablet Take 81 mg by mouth daily.       No current facility-administered medications on file prior to visit.        Allergies   Allergen Reactions   ??? Fish Oil Nausea Only     Patient denies   ??? Metformin Diarrhea   ??? Relafen [Nabumetone] Nausea Only       Social History     Tobacco Use   ??? Smoking status: Never Smoker   ??? Smokeless tobacco: Never Used   Substance Use Topics   ??? Alcohol use: Yes     Alcohol/week: 4.0 standard drinks     Types: 4 Glasses of wine per week   ??? Drug use: No       Family History   Problem Relation Age of Onset   ??? Diabetes Mother    ??? Elevated Lipids Father    ??? Stroke Paternal  Grandmother    ??? Heart Disease Paternal Grandfather          ROS: positives are bolded  General: fevers, chills, night sweats, fatigue, weight loss, weight gain  GI: nausea, vomiting, abdominal pain, change in bowel habits, hematochezia, melena, GERD  Integ: dermatitis, abnormal moles  HEENT: visual changes, vertigo, epistaxis, dysphagia, hoarseness  Cardiac: chest pain, palpitations, HTN, edema, varicosities  Resp: cough, shortness of breath, wheezing, hemoptysis, snoring, reactive airway disease  GU: hematuria, dysuria, frequency, urgency, nocturia, stress urinary incontinence   MSK: weakness, joint pain, arthritis  Endocrine:  thyroid disease, polyuria, polydipsia, polyphagia, poor wound healing, heat intolerance, cold intolerance  Lymph/Heme: anemia, bruising, history of blood transfusions  Neuro: dizziness, headache, fainting, seizures, stroke  Psych: anxiety, depression    Physical Exam:  Visit Vitals  BP 128/78   Temp 97.4 ??F (36.3 ??C) (Skin)   Resp 16   Ht '5\' 4"'  (1.626 m)   Wt 74.4 kg (164 lb)   BMI 28.15 kg/m??       Gen:  No distress  Head: normocephalic, atraumatic  Mouth: Clear, no overt lesions, oral mucosa pink and moist  Neck: supple, no masses, no adenopathy, trachea midline  Resp: clear bilaterally  Cardio: Regular rate and rhythm  Abdomen: soft, nontender, nondistended  Extremities: warm, well-perfused  Neuro: sensation and strength grossly intact and symmetrical  Psych: alert and oriented to person, place and time  Breasts:   Right: Examined in both the supine and upright positions.  There was no supraclavicular, infraclavicular, or axillary lymphadenopathy.   There were no dominant masses, no skin changes, no asymmetry identified   Left: Examined in both the supine and upright positions.  There was no supraclavicular, infraclavicular, or axillary lymphadenopathy.   There were no dominant masses,  no skin changes, no asymmetry identified core biopsy wound clean, ecchymosis and nodularity lower  outer, hematoma vs residual mass      Imaging:  02/09/20 bilateral mammogram/ultrasound  Recommend ultrasound-guided core biopsy of 1.7 cm left breast mass. Findings and  recommendations were discussed with the patient. Patient may be seen by breast  Center staff for further coordination of care.  ??  Borderline prominent lymph nodes in the left.  ??  Stable probably benign findings in the right breast. Recommend additional one  year follow-up diagnostic mammogram and ultrasound to document two-year  stability.  ??  Thank you for this referral.    Pathology:  02/21/20   LEFT BREAST, 4 O'CLOCK POSITION, CORE BIOPSY:   INVASIVE ADENOCARCINOMA, NUCLEAR GRADE 3.    Estrogen Receptor (ER): Negative (0%, Internal controls present and stain as expected).   Progesterone Receptor (PgR): Negative (0%, Internal controls present and stain as expected).   Her-2 (IHC Method): Negative (score 0)   Percentage of cells with intense complete membrane staining: 0%     Impression:  Patient Active Problem List   Diagnosis Code   ??? Vitamin D deficiency E55.9   ??? Anxiety F41.9   ??? Dyslipidemia E78.5   ??? 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 Overweight with body mass index (BMI) of 25 to 25.9 in adult E66.3, Z68.25   ??? Controlled type 2 diabetes mellitus, without long-term current use of insulin (HCC) E11.9   ??? Breast cancer of lower-outer quadrant of left female breast [facility]) C45.512          69 year old woman with left cT2N0 triple negative breast cancer, s/p core biopsy 02/21/20.        We discussed that there are many options for treatment of breast cancer.  Surgery, chemotherapy, radiation and hormone therapy are all tools that may be used in the treatment of breast cancer.  For each patient, we determine which will be most beneficial based on her individual set of circumstances.  For some patients all four treatment categories will be recommended.  For others one or more of these options are not appropriate.    We began by reviewing her  imaging thus far as well as pathology in detail.      Chemotherapy was addressed.  Often chemotherapy is administered after surgery, however due to triple negative status, Ms. Afia Messenger Strandberg may benefit from neoadjuvant chemotherapy or treatment prior to surgery.  Chemotherapy is systemic treatment aimed largely to decrease chance of spread or recurrence of cancer.  It may also decrease the size of her cancer to facilitate surgery.  It is administered by a medical oncologist.  I have recommended referral to medical oncology for additional information regarding chemotherapy. Chemotherapy is generally administered via port.        Regarding surgery, there are two main options, lumpectomy or mastectomy.  We discussed both in detail.  Overall survival and distant recurrence rates are the same. The decision is generally a personal decision more so than a medical one.     Lumpectomy is also known as breast conservation surgery or partial mastectomy. The goal is to remove the area of concern as well as surrounding area of uninvolved tissue ("clear margins").   Radiation is almost always recommended with lumpectomy to allow for acceptable local recurrence rates.  Local recurrence rates are approximately 6% after lumpectomy with radiation.  Risks, benefits and options were discussed in detail to include, but not limited to, bleeding, infection, risks  of anesthesia, injury to surrounding structures and other unforeseen events such as stroke, heart attack or death.      Mastectomy was then addressed.  With mastectomy, almost all of the breast tissue is removed.  We are not able to remove 100% of the breast tissue.  The risk of local recurrence is approximately 2-4% after mastectomy.  The overlying skin is generally numb.  Most often, the numbness is permanent.  Mastectomy can be performed with or without reconstruction.  The reconstruction is performed by the plastic surgeon.  Commonly it is a multi-step process with placement  of tissue expanders as the first step. If she is interested in reconstruction, I will refer her to plastic surgery.  Often we are able to perform a nipple sparing mastectomy with reconstruction.  The reconstructed breast differs in many ways from the native breast.  The goal is that in a bra or clothing, no one can tell she has had a mastectomy.  Radiation generally is not needed after mastectomy.  There are some circumstances, usually based on size, margins, local extension or lymph node status, where post-mastectomy radiation is recommended.   Risks, benefits and options were discussed in detail to include, but not limited to, bleeding, infection, risks of anesthesia, injury to surrounding structures and other unforeseen events such as stroke, heart attack or death. Routine screening mammogram is not recommended after mastectomy.      As she is clinically node negative, she is a candidate for sentinel node biopsy. We discussed this procedure is a targeted sampling of the axillary lymph nodes to allow for staging of her disease.  She will be injected with radioactive isotope as well as blue dye to allow for mapping and removal of the sentinel nodes.  By removing only the sentinel nodes and not performing axillary node dissection, she has a decreased risk of lymphedema (arm swelling) and nerve injury.  We did specifically discuss that both of these risks still exist.   Risks, benefits and options were discussed in detail to include, but not limited to, bleeding, infection, risks of anesthesia, injury to surrounding structures and other unforeseen events such as stroke, heart attack or death.    If a significant amount of cancer is noted in her lymph nodes, an axillary lymph node dissection may be recommended.  In this procedure more, but not all, of the lymph nodes under the arm are removed.  The chance of both lymphedema and nerve injury are increased with axillary node dissection compared to sentinel node biopsy.  I generally recommend referral to physical therapy and a lymphedema specialist if an axillary node dissection is performed.     We discussed radiation.  Radiation is a local therapy aimed to decrease the chance of local recurrence.  It is administered under the direction of a radiation oncologist.  Radiation is almost always recommended with lumpectomy.  It generally is not needed after mastectomy.  There are some circumstances, usually based on size, margins, local extension or lymph node status, where post-mastectomy radiation is recommended.  Most commonly it is administered five days a week for up to seven weeks.  Most of the side effects, with the exception of fatigue, are local.      Anti-hormone or hormone blocking therapy is used in hormone sensitive, estrogen receptor positive breast cancer.  It is generally recommended for 5-10 years.  There are two categories of hormone blocking medications.  Tamoxifen is a selective estrogen reuptake modulator (SERM).  There are several  aromatase inhibitors.  These medications are generally prescribed by a medical oncologist.     We discussed genetic testing prior to proceeding with surgery. We discussed genetic testing in detail.   Health insurance should not be significantly affected by the results, though life and disability insurance may be.  Often these would be affected by a breast cancer diagnosis, but it may be more difficult to obtain with a known personal genetic mutation. Those pursuing genetic testing may consider obtaining or maximizing benefits prior to proceeding with genetic testing.  If she is discovered to harbor a germline mutation, bilateral mastectomy with or without reconstruction will be recommended as her operation.         We will schedule medical oncology consulation.  All questions were answered.  She was asked to call with any additional questions or concerns.

## 2020-02-28 NOTE — Progress Notes (Signed)
Oncology Social Work Follow-up Note   Patient name: Laura Roman   MRN: [id number]   Date of Birth: 25-Apr-1951     02/28/2020    MSW received referral from nurse navigator.  Patient has no needs at this time-MSW will provide letter of introduction and information on support services provided by MSW.  Information to be provided by [employer] (mail).  MSW will provide support and assistance as needed and/or requested.     Faren A. Albertina Parr, MSW, LMSW

## 2020-02-29 DIAGNOSIS — Z23 Encounter for immunization: Secondary | ICD-10-CM | POA: Diagnosis not present

## 2020-02-29 NOTE — Progress Notes (Signed)
Office Visit Note  Patient: Krisinda Tencza Geraci             Date of Birth: [birth year]           MRN: [id number]             PCP: Hali Marry, MD Referring: Hali Marry, * Visit Date: 03/08/2020 Occupation: @GUAROCC @  Subjective:  Medication monitoring  History of Present Illness: JAX JAQUEZ is a 69 y.o. female with history of seronegative rheumatoid arthritis, osteoarthritis, and fibromyalgia.  She is taking Arava 20 mg 1 tablet by mouth daily.  Patient reports that about a month and a half ago she missed 2 weeks of [place] due to being on vacation in [place].  She states that during that time she was experiencing increased pain and stiffness in both hands.  She states that her discomfort has improved significantly since resuming [place].  She states that her right knee replacement is doing well but she continues have intermittent discomfort in the left knee.  She states that her left knee joint will swell on occasion.  She experiences myalgias and muscle tenderness in bilateral lower extremities due to fibromyalgia.  She also has trapezius muscle tension and muscle tenderness bilaterally. Patient reports that she was diagnosed with Covid in January 2021.  She states that she continues to have shortness of breath with exertion and has had episodes of an irregular heartbeat.  She is currently wearing a Holter monitor.   Activities of Daily Living:  Patient reports morning stiffness for 15-20 minutes.   Patient Reports nocturnal pain.  Difficulty dressing/grooming: Denies Difficulty climbing stairs: Reports Difficulty getting out of chair: Reports Difficulty using hands for taps, buttons, cutlery, and/or writing: Denies  Review of Systems  Constitutional: Positive for fatigue.  HENT: Positive for mouth dryness. Negative for mouth sores and nose dryness.   Eyes: Positive for dryness. Negative for pain, itching and visual disturbance.  Respiratory: Negative for cough, hemoptysis  and difficulty breathing.   Cardiovascular: Positive for swelling in legs/feet. Negative for chest pain, palpitations and hypertension.  Gastrointestinal: Positive for diarrhea. Negative for blood in stool and constipation.  Endocrine: Negative for increased urination.  Genitourinary: Negative for difficulty urinating and painful urination.  Musculoskeletal: Positive for arthralgias, joint pain, joint swelling, myalgias, morning stiffness, muscle tenderness and myalgias. Negative for muscle weakness.  Skin: Negative for color change, pallor, rash, hair loss, nodules/bumps, redness, skin tightness, ulcers and sensitivity to sunlight.  Allergic/Immunologic: Negative for susceptible to infections.  Neurological: Negative for dizziness, headaches, memory loss and weakness.  Hematological: Negative for bruising/bleeding tendency and swollen glands.  Psychiatric/Behavioral: Negative for depressed mood, confusion and sleep disturbance. The patient is not nervous/anxious.     PMFS History:  Patient Active Problem List   Diagnosis Date Noted  . Gastroenteritis 11/07/2019  . Dehydration 11/06/2019  . Prediabetes 11/06/2019  . Gait instability 06/20/2019  . Facet arthritis of lumbar region 05/10/2018  . Hypokalemia 05/10/2018  . MCI (mild cognitive impairment) 11/25/2017  . Restrictive lung disease 07/30/2017  . Mixed hyperlipidemia 06/18/2017  . Rectocele 04/26/2017  . Irritable bowel syndrome with both constipation and diarrhea 04/26/2017  . Osteoporosis 04/18/2017  . Trochanteric bursitis of both hips 02/24/2017  . Stress fracture of left tibia 11/05/2016  . Inflammatory arthritis 09/30/2016  . High risk medication use 09/30/2016  . History of Clostridium difficile colitis 09/30/2016  . 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 Pseudogout 09/30/2016  . DDD cervical spine status post  fusion 09/30/2016  . DDD thoracic spine 09/30/2016  . H/O total knee replacement, right 09/30/2016    . Age-related osteoporosis without current pathological fracture 09/30/2016  . Tremor, essential 05/14/2016  . Right foot pain 04/14/2016  . B12 deficiency 09/10/2015  . Syncope 09/10/2015  . Cervical facet joint syndrome 09/10/2015  . Chronic fatigue 09/10/2015  . Aortic atherosclerosis ([place]) 04/01/2015  . DOE (dyspnea on exertion)   . Primary osteoarthritis of right knee 08/13/2014  . Benign head tremor 06/11/2014  . Lumbar degenerative disc disease 06/11/2014  . IFG (impaired fasting glucose) 03/09/2014  . Hemorrhoid 03/09/2014  . Retinal wrinkling, right eye 03/09/2014  . Fibromyalgia 03/09/2014  . GERD (gastroesophageal reflux disease) 03/09/2014  . History of arthroplasty of right knee 01/31/2014  . Memory difficulty 12/14/2013  . Hemorrhoids, external, thrombosed 06/22/2011  . Hypertension 03/11/2011  . SVT (supraventricular tachycardia) ([place])   . Raynaud phenomenon   . EDEMA 08/25/2010  . Nonspecific (abnormal) findings on radiological and other examination of body structure 08/25/2010  . COMPUTERIZED TOMOGRAPHY, CHEST, ABNORMAL 08/25/2010  . OSA (obstructive sleep apnea) 04/24/2009  . Allergic rhinitis 06/11/2008  . Dyspnea 06/11/2008  . 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 Chronic diastolic CHF (congestive heart failure) ([place]) 06/08/2008  . INTERSTITIAL CYSTITIS 06/08/2008    Past Medical History:  Diagnosis Date  . Anal fissure   . Anemia   . Arthritis   . Blood transfusion   . C. difficile colitis   . Chest pain    a. 01/2013 MV: EF 59%, no ischemia.  . Chronic Dyspnea    a. 01/2013 Echo: EF 60-65%, Gr 1 DD, PASP 35mmHg.// Echo 01/2020: EF 60-65, no RWMA, GR 1 DD, GLS -21.6%, normal RV SF, trivial MR   . COVID-19   . Diabetes mellitus   . Fibromyalgia   . Gall stones   . GERD (gastroesophageal reflux disease)    gastritis  . Hemorrhoids   . Hypertension   . Interstitial cystitis   . MCI (mild cognitive impairment) 11/25/2017  . Memory  difficulty 12/14/2013  . Neuromuscular disorder ([place])    sclerosis  . Osteoporosis   . Peptic ulcer   . PONV (postoperative nausea and vomiting)   . Pseudogout   . Raynaud phenomenon   . Rectal bleeding   . Sleep apnea    a. on cpap.  Marland Kitchen SVT (supraventricular tachycardia) ([place])   . Syncope 09/10/2015  . Thyroid disease    hypothyroidism  . Tremor, essential 05/14/2016    Family History  Problem Relation Age of Onset  . Heart attack Mother   . Stroke Mother   . Diabetes Mother   . Heart disease Mother   . Colon polyps Mother   . Asthma Mother   . Breast cancer Maternal Grandmother   . Wilson's disease Maternal Grandmother   . Pancreatic cancer Maternal Grandfather   . Heart disease Father   . Heart attack Father   . Asthma Father   . Stroke Sister   . Hypertension Sister   . Rheum arthritis Sister   . Dementia Sister   . Asthma Sister   . Lupus Sister   . Asthma Sister    Past Surgical History:  Procedure Laterality Date  . APPENDECTOMY    . BLADDER SURGERY     x2  . BLADDER SURGERY    . BREAST BIOPSY    . CARDIAC CATHETERIZATION    . CARPAL TUNNEL RELEASE    . CATARACT EXTRACTION Bilateral   .  CHOLECYSTECTOMY    . DILATION AND CURETTAGE OF UTERUS    . ENTEROCELE REPAIR     x2  . EYE SURGERY     retina  . hysterectomy - unknown type    . JOINT REPLACEMENT    . KNEE ARTHROPLASTY    . KNEE SURGERY     x6  . NECK SURGERY     fusion  . OOPHORECTOMY    . QUADRICEPS REPAIR Right   . RECTOCELE REPAIR     x2  . SHOULDER ARTHROSCOPY WITH SUBACROMIAL DECOMPRESSION, ROTATOR CUFF REPAIR AND BICEP TENDON REPAIR  10/06/2012   Procedure: SHOULDER ARTHROSCOPY WITH SUBACROMIAL DECOMPRESSION, ROTATOR CUFF REPAIR AND BICEP TENDON REPAIR;  Surgeon: Nita Sells, MD;  Location: [facility];  Service: Orthopedics;  Laterality: Right;  Arthroscopic  Repair  of  Subscapularis, Open Biceps Tenodesis  . SHOULDER SURGERY     bilateral- bones spur  .  TONSILLECTOMY    . TOTAL KNEE ARTHROPLASTY    . TOTAL SHOULDER ARTHROPLASTY     Social History   Social History Narrative   Patient drinks about 3-4 cups of caffeine daily.   Patient is right handed.   Immunization History  Administered Date(s) Administered  . Fluad Quad(high Dose 65+) 07/13/2019  . Influenza Split 07/26/2012  . Influenza Whole 07/30/2009, 08/19/2010  . Influenza, High Dose Seasonal PF 07/06/2016, 07/05/2017  . Influenza,inj,Quad PF,6+ Mos 06/11/2014, 07/25/2015, 06/23/2018  . Influenza-Unspecified 09/05/2013  . Pneumococcal Conjugate-13 01/04/2017  . Pneumococcal Polysaccharide-23 07/30/2009, 11/14/2018  . Tdap 02/13/2008, 05/12/2018  . Zoster 09/01/2011     Objective: Vital Signs: BP 108/63 (BP Location: Left Arm, Patient Position: Sitting, Cuff Size: Normal)   Pulse 64   Resp 14   Ht 5\' 6"  (1.676 m)   Wt 191 lb 6.4 oz (86.8 kg)   BMI 30.89 kg/m    Physical Exam Vitals and nursing note reviewed.  Constitutional:      Appearance: She is well-developed.  HENT:     Head: Normocephalic and atraumatic.  Eyes:     Conjunctiva/sclera: Conjunctivae normal.  Pulmonary:     Effort: Pulmonary effort is normal.  Abdominal:     General: Bowel sounds are normal.     Palpations: Abdomen is soft.  Musculoskeletal:     Cervical back: Normal range of motion.  Lymphadenopathy:     Cervical: No cervical adenopathy.  Skin:    General: Skin is warm and dry.     Capillary Refill: Capillary refill takes less than 2 seconds.  Neurological:     Mental Status: She is alert and oriented to person, place, and time.  Psychiatric:        Behavior: Behavior normal.      Musculoskeletal Exam: C-spine limited range of motion with lateral rotation.  She has trapezius muscle tension and muscle tenderness bilaterally.  Thoracic and lumbar spine have good range of motion with discomfort in the lumbar region.  Shoulder joints, wrist joints, MCPs, PIPs, DIPs have good range of  motion with no synovitis.  Right elbow mild flexion contracture and tenderness noted. She has complete fist formation bilaterally.  She has PIP and DIP thickening consistent with osteoarthritis of both hands.  Hip joints have good range of motion with no discomfort.  Right knee replacement has slightly limited extension but no warmth or effusion.  Left knee crepitus noted.  No warmth or effusion of the left knee joint noted.  Ankle joints have good range of motion with  no tenderness or inflammation.  She has pitting edema bilaterally.  She has tenderness of the right first MTP but no inflammation was noted.  She has PIP and DIP thickening consistent with osteoarthritis of both feet.  CDAI Exam: CDAI Score: 1.6  Patient Global: 4 mm; Provider Global: 2 mm Swollen: 0 ; Tender: 3  Joint Exam 03/08/2020      Right  Left  Elbow   Tender     Ankle      Tender  MTP 1   Tender        Investigation: No additional findings.  Imaging: DG Chest 2 View  Result Date: 02/22/2020 CLINICAL DATA:  Shortness of breath, prior history of COVID EXAM: CHEST - 2 VIEW COMPARISON:  11/06/2019 FINDINGS: The heart size and mediastinal contours are within normal limits. Both lungs are clear. No pleural effusion or pneumothorax. Two level anterior cervical spine fusion. IMPRESSION: No acute process in the chest. Electronically Signed   By: Macy Mis M.D.   On: 02/22/2020 13:25   CT Angio Chest PE W and/or Wo Contrast  Result Date: 02/22/2020 CLINICAL DATA:  Shortness of breath for 2 days, initial encounter EXAM: CT ANGIOGRAPHY CHEST WITH CONTRAST TECHNIQUE: Multidetector CT imaging of the chest was performed using the standard protocol during bolus administration of intravenous contrast. Multiplanar CT image reconstructions and MIPs were obtained to evaluate the vascular anatomy. CONTRAST:  30mL OMNIPAQUE IOHEXOL 350 MG/ML SOLN COMPARISON:  Chest x-ray from earlier in the same day. FINDINGS: Cardiovascular: Thoracic  aorta shows no aneurysmal dilatation. Scattered atherosclerotic calcifications noted. No significant cardiomegaly is seen. No pericardial effusion is noted. The pulmonary artery shows a normal branching pattern without intraluminal filling defect to suggest pulmonary embolism. Mediastinum/Nodes: Thoracic inlet is within normal limits. No sizable hilar or mediastinal adenopathy is noted. The esophagus as visualized is within normal limits. Lungs/Pleura: Lungs are well aerated bilaterally with very mild dependent atelectatic changes. Some mosaic changes are noted likely related air trapping. No focal confluent infiltrate or effusion is seen. Upper Abdomen: Visualized upper abdomen is within normal limits. Musculoskeletal: No acute bony abnormality is noted. Review of the MIP images confirms the above findings. IMPRESSION: No evidence of pulmonary emboli. Mild dependent atelectatic changes. Aortic Atherosclerosis (ICD10-I70.0) and Emphysema (ICD10-J43.9). Electronically Signed   By: Inez Catalina M.D.   On: 02/22/2020 19:43   ECHOCARDIOGRAM COMPLETE  Result Date: 03/05/2020    ECHOCARDIOGRAM REPORT   Patient Name:   HAVEN KEEZER Date of Exam: 03/04/2020 Medical Rec #:  [id number]    Height:       66.0 in Accession #:    [id number]   Weight:       192.0 lb Date of Birth:  Dec 06, 1950    BSA:          1.965 m Patient Age:    46 years     BP:           166/71 mmHg Patient Gender: F            HR:           56 bpm. Exam Location:  [facility] Procedure: 2D Echo, Cardiac Doppler and Color Doppler Indications:    Dyspnea  History:        Patient has prior history of Echocardiogram examinations, most                 recent 02/01/2020. CHF, Arrythmias:PAC, Signs/Symptoms:Syncope and  Chest Pain; Risk Factors:Hypertension and Diabetes.  Sonographer:    Cardell Peach RDCS (AE) Referring Phys: [id number] Rene Kocher [place]  1. Left ventricular ejection fraction, by estimation, is 60 to 65%. The left ventricle  has normal function. The left ventricle has no regional wall motion abnormalities. There is mild left ventricular hypertrophy. Left ventricular diastolic parameters were normal.  2. Right ventricular systolic function is normal. The right ventricular size is normal. There is normal pulmonary artery systolic pressure.  3. The mitral valve is normal in structure. Mild mitral valve regurgitation. No evidence of mitral stenosis.  4. The aortic valve is normal in structure. Aortic valve regurgitation is not visualized. No aortic stenosis is present.  5. The inferior vena cava is normal in size with greater than 50% respiratory variability, suggesting right atrial pressure of 3 mmHg. FINDINGS  Left Ventricle: Left ventricular ejection fraction, by estimation, is 60 to 65%. The left ventricle has normal function. The left ventricle has no regional wall motion abnormalities. The left ventricular internal cavity size was normal in size. There is  mild left ventricular hypertrophy. Left ventricular diastolic parameters were normal. Right Ventricle: The right ventricular size is normal. No increase in right ventricular wall thickness. Right ventricular systolic function is normal. There is normal pulmonary artery systolic pressure. The tricuspid regurgitant velocity is 1.94 m/s, and  with an assumed right atrial pressure of 3 mmHg, the estimated right ventricular systolic pressure is [id number] mmHg. Left Atrium: Left atrial size was normal in size. Right Atrium: Right atrial size was normal in size. Pericardium: There is no evidence of pericardial effusion. Mitral Valve: The mitral valve is normal in structure. Normal mobility of the mitral valve leaflets. Mild mitral valve regurgitation. No evidence of mitral valve stenosis. Tricuspid Valve: The tricuspid valve is normal in structure. Tricuspid valve regurgitation is not demonstrated. No evidence of tricuspid stenosis. Aortic Valve: The aortic valve is normal in structure. Aortic  valve regurgitation is not visualized. No aortic stenosis is present. Pulmonic Valve: The pulmonic valve was normal in structure. Pulmonic valve regurgitation is not visualized. No evidence of pulmonic stenosis. Aorta: The aortic root is normal in size and structure. Venous: The inferior vena cava is normal in size with greater than 50% respiratory variability, suggesting right atrial pressure of 3 mmHg. IAS/Shunts: No atrial level shunt detected by color flow Doppler.  LEFT VENTRICLE PLAX 2D LVIDd:         5.02 cm  Diastology LVIDs:         3.15 cm  LV e' lateral:   6.53 cm/s LV PW:         1.08 cm  LV E/e' lateral: 10.2 LV IVS:        1.19 cm  LV e' medial:    6.09 cm/s LVOT diam:     2.00 cm  LV E/e' medial:  10.9 LV SV:         56 LV SV Index:   28 LVOT Area:     3.14 cm  RIGHT VENTRICLE            IVC RV Basal diam:  3.26 cm    IVC diam: 1.84 cm RV S prime:     9.57 cm/s TAPSE (M-mode): 2.1 cm LEFT ATRIUM             Index       RIGHT ATRIUM           Index LA diam:  3.60 cm 1.83 cm/m  RA Area:     14.60 cm LA Vol (A2C):   49.9 ml 25.39 ml/m RA Volume:   35.70 ml  18.16 ml/m LA Vol (A4C):   50.8 ml 25.82 ml/m LA Biplane Vol: 56.9 ml 28.95 ml/m  AORTIC VALVE LVOT Vmax:   77.30 cm/s LVOT Vmean:  53.700 cm/s LVOT VTI:    0.177 m  AORTA Ao Root diam: 2.50 cm Ao Asc diam:  2.60 cm MITRAL VALVE               TRICUSPID VALVE MV Area (PHT): 2.73 cm    TR Peak grad:   15.1 mmHg MV Decel Time: 278 msec    TR Vmax:        194.00 cm/s MV E velocity: 66.40 cm/s MV A velocity: 61.50 cm/s  SHUNTS MV E/A ratio:  1.08        Systemic VTI:  0.18 m                            Systemic Diam: 2.00 cm Jenne Campus MD Electronically signed by Jenne Campus MD Signature Date/Time: 03/05/2020/8:55:11 AM    Final    VAS [place] LOWER EXTREMITY VENOUS (DVT) (ONLY [facility] & [facility])  Result Date: 02/22/2020  Lower Venous DVTStudy Indications: Swelling, and Tachycardia.  Risk Factors: History of SVT. Limitations: Tight edema.  Comparison Study: Prior studies done 02/15/17 and 10/27/16 are available for                   comparison. Performing Technologist: Sharion Dove RVS  Examination Guidelines: A complete evaluation includes B-mode imaging, spectral Doppler, color Doppler, and power Doppler as needed of all accessible portions of each vessel. Bilateral testing is considered an integral part of a complete examination. Limited examinations for reoccurring indications may be performed as noted. The reflux portion of the exam is performed with the patient in reverse Trendelenburg.  +---------+---------------+---------+-----------+----------+--------------+ RIGHT    CompressibilityPhasicitySpontaneityPropertiesThrombus Aging +---------+---------------+---------+-----------+----------+--------------+ CFV      Full           Yes      Yes                                 +---------+---------------+---------+-----------+----------+--------------+ SFJ      Full                                                        +---------+---------------+---------+-----------+----------+--------------+ FV Prox  Full                                                        +---------+---------------+---------+-----------+----------+--------------+ FV Mid   Full                                                        +---------+---------------+---------+-----------+----------+--------------+ FV DistalFull                                                        +---------+---------------+---------+-----------+----------+--------------+  PFV      Full                                                        +---------+---------------+---------+-----------+----------+--------------+ POP      Full           Yes      Yes                                 +---------+---------------+---------+-----------+----------+--------------+ PTV      Full                                                         +---------+---------------+---------+-----------+----------+--------------+ PERO     Full                                                        +---------+---------------+---------+-----------+----------+--------------+   +---------+---------------+---------+-----------+----------+--------------+ LEFT     CompressibilityPhasicitySpontaneityPropertiesThrombus Aging +---------+---------------+---------+-----------+----------+--------------+ CFV      Full           Yes      Yes                                 +---------+---------------+---------+-----------+----------+--------------+ SFJ      Full                                                        +---------+---------------+---------+-----------+----------+--------------+ FV Prox  Full                                                        +---------+---------------+---------+-----------+----------+--------------+ FV Mid   Full                                                        +---------+---------------+---------+-----------+----------+--------------+ FV DistalFull                                                        +---------+---------------+---------+-----------+----------+--------------+ PFV      Full                                                        +---------+---------------+---------+-----------+----------+--------------+  POP      Full           Yes      Yes                                 +---------+---------------+---------+-----------+----------+--------------+ PTV      Full                                                        +---------+---------------+---------+-----------+----------+--------------+ PERO     Full                                                        +---------+---------------+---------+-----------+----------+--------------+     Summary: RIGHT: - Findings appear essentially unchanged compared to previous examination. - There is no evidence of  deep vein thrombosis in the lower extremity.  LEFT: - Findings appear essentially unchanged compared to previous examination. - There is no evidence of deep vein thrombosis in the lower extremity.  *See table(s) above for measurements and observations. Electronically signed by Curt Jews MD on 02/22/2020 at 4:26:37 PM.    Final     Recent Labs: Lab Results  Component Value Date   WBC 5.8 02/22/2020   HGB 11.1 (L) 02/22/2020   PLT 401 (H) 02/22/2020   NA 136 02/22/2020   K 4.2 02/22/2020   CL 98 02/22/2020   CO2 26 02/22/2020   GLUCOSE 122 (H) 02/22/2020   BUN <5 (L) 02/22/2020   CREATININE 0.78 02/22/2020   BILITOT 0.4 11/16/2019   ALKPHOS 84 11/16/2019   AST 30 11/16/2019   ALT 22 11/16/2019   PROT 7.2 11/16/2019   ALBUMIN 3.7 11/16/2019   CALCIUM 9.5 02/22/2020   GFRAA >60 02/22/2020    Speciality Comments: No specialty comments available.  Procedures:  No procedures performed Allergies: Codeine, Myrbetriq  [mirabegron], Percocet [oxycodone-acetaminophen], Celebrex [celecoxib], Darvocet [propoxyphene n-acetaminophen], Erythromycin, Hydrocodone, Lyrica [pregabalin], Macrodantin [nitrofurantoin], Toradol [ketorolac tromethamine], Tramadol, and Verapamil   Assessment / Plan:     Visit Diagnoses: Seronegative rheumatoid arthritis: She has no synovitis on exam today.  She has tenderness of the right elbow, left ankle, and left first MTP joint but no inflammation was noted.  Overall she is clinically doing well on Arava 20 mg 1 tablet by mouth daily.  About a month and a half ago she missed 2 weeks of [place] due to being on vacation in [place].  She experienced increased pain and stiffness in both hands during that time.  Her discomfort has improved significantly since resuming Arava as prescribed.  She will continue taking Arava 20 mg 1 tablet by mouth daily.  She was advised to notify us if she develops increased joint pain or joint swelling.  She will follow-up in the office in 5  months.  High risk medication use - Arava 20 mg 1 tablet daily.  CBC was drawn on 02/22/2020 which revealed mild anemia.  Hemoglobin was 11.1.  BMP was ordered on 02/22/2020.  She would like to update CBC and CMP today.  She will return for lab work in August and every 3 months  to monitor for drug toxicity.  She was advised to hold [place] if she develops any signs or symptoms of an infection and to resume once the infection has completely cleared.- Plan: CBC with Differential/Platelet, COMPLETE METABOLIC PANEL WITH GFR  SOB (shortness of breath) on exertion: She has been experiencing shortness of breath on exertion since being diagnosed with Covid 19 on January 2021.  She has been undergoing a thorough work-up and had an echocardiogram performed on 03/04/2020. Echo results were unremarkable. She has also been experiencing irregular heart rate intermittently  and is currently wearing a Holter monitor.  Raynaud's phenomenon without gangrene: Not currently active.  No digital ulcerations or signs of gangrene were noted.  Primary osteoarthritis of left knee: She has chronic pain in the left knee joint.  She is slightly limited extension with discomfort.  No warmth or effusion was noted.  H/O total knee replacement, right: Doing well.  She is slightly limited extension with warmth but no effusion noted.  Pseudogout: She has not had any recent flares.  DDD (degenerative disc disease), cervical: She has limited range of motion with lateral rotation.  She has trapezius muscle tension and muscle tenderness bilaterally.  She was encouraged to perform neck exercises on a regular basis.  DDD (degenerative disc disease), thoracic: No midline spinal tenderness.  DDD (degenerative disc disease), lumbar -  Follows up with Dr. Francesco Runner and Dr. Tobias Alexander.  She is currently going to physical therapy twice a week which has been improving her symptoms.  She has no symptoms of radiculopathy at this time.  Fibromyalgia:  She experiences generalized myalgias and muscle tenderness due to fibromyalgia.  She is having trapezius muscle tension and muscle tenderness bilaterally.  She experiences neck stiffness and was encouraged to perform neck exercises.  She continues to have chronic lower back pain and is currently going to physical therapy twice a week which has been improving her symptoms significantly.  We discussed the importance of regular exercise and good sleep hygiene.  Other osteoporosis without current pathological fracture: DEXA on 06/07/2019 right femoral neck BMD 0.564 with T score -2.6.  She is on Prolia 60 mg subcutaneous injections every 6 months.  Her last Prolia injection was in June 2020.  S/P carpal tunnel release: Left: Asymptomatic   Carpal tunnel syndrome, right upper limb: NCV with EMG on 08/19/2017 moderate right median nerve entrapment at the wrist affecting sensory and motor components.  She has constant paresthesias in the right hand.  She experiences intermittent discomfort in the right elbow but declined a cortisone injection today.  She would like a referral to Dr. Grandville Silos to discuss treatment options.  Chronic fatigue: Stable   Other medical conditions are listed as follows:   IC (interstitial cystitis)  History of CHF (congestive heart failure)  History of hypertension  History of Clostridium difficile colitis  History of gastroesophageal reflux (GERD)  Orders: Orders Placed This Encounter  Procedures  . CBC with Differential/Platelet  . COMPLETE METABOLIC PANEL WITH GFR  . Ambulatory referral to Orthopedic Surgery   No orders of the defined types were placed in this encounter.   Follow-Up Instructions: Return in about 5 months (around 08/08/2020) for Rheumatoid arthritis, Osteoarthritis, Fibromyalgia.   [name], PA-C  Note - This record has been created using Dragon software.  Chart creation errors have been sought, but may not always  have been located.  Such creation errors do not reflect on  the standard of medical care.

## 2020-03-01 ENCOUNTER — Telehealth: Payer: Self-pay | Admitting: Radiology

## 2020-03-01 NOTE — Telephone Encounter (Signed)
Enrolled patient for a 14 day Zio AT monitor to be mailed to patients home. Brief instructions were gone over with patient and she knows to expect the monitor to arrive in 3-4 days.

## 2020-03-02 ENCOUNTER — Encounter

## 2020-03-04 ENCOUNTER — Other Ambulatory Visit: Payer: Self-pay

## 2020-03-04 ENCOUNTER — Ambulatory Visit (HOSPITAL_BASED_OUTPATIENT_CLINIC_OR_DEPARTMENT_OTHER)
Admission: RE | Admit: 2020-03-04 | Discharge: 2020-03-04 | Disposition: A | Discharge status: Home / Self Care | Payer: Medicare Other | Source: Ambulatory Visit | Attending: Family Medicine | Admitting: Family Medicine

## 2020-03-04 DIAGNOSIS — R002 Palpitations: Secondary | ICD-10-CM

## 2020-03-04 DIAGNOSIS — R0602 Shortness of breath: Secondary | ICD-10-CM

## 2020-03-04 DIAGNOSIS — R600 Localized edema: Secondary | ICD-10-CM

## 2020-03-04 MED ORDER — LANSOPRAZOLE 30 MG CAP, DELAYED RELEASE
30 mg | ORAL_CAPSULE | ORAL | 3 refills | Status: DC
Start: 2020-03-04 — End: 2021-07-03

## 2020-03-04 NOTE — Progress Notes (Signed)
  Echocardiogram 2D Echocardiogram has been performed.  Cardell Peach 03/04/2020, 2:41 PM

## 2020-03-05 ENCOUNTER — Encounter (INDEPENDENT_AMBULATORY_CARE_PROVIDER_SITE_OTHER): Payer: Medicare Other

## 2020-03-05 DIAGNOSIS — R600 Localized edema: Secondary | ICD-10-CM | POA: Diagnosis not present

## 2020-03-05 DIAGNOSIS — R0602 Shortness of breath: Secondary | ICD-10-CM

## 2020-03-05 DIAGNOSIS — R002 Palpitations: Secondary | ICD-10-CM

## 2020-03-05 NOTE — Progress Notes (Signed)
PA approval for Lansoprazole. Approval dates 02/03/20-03/04/21

## 2020-03-06 ENCOUNTER — Ambulatory Visit: Payer: Medicare Other | Admitting: Cardiology

## 2020-03-06 DIAGNOSIS — R002 Palpitations: Secondary | ICD-10-CM | POA: Diagnosis not present

## 2020-03-06 DIAGNOSIS — M542 Cervicalgia: Secondary | ICD-10-CM | POA: Diagnosis not present

## 2020-03-06 DIAGNOSIS — M545 Low back pain: Secondary | ICD-10-CM | POA: Diagnosis not present

## 2020-03-07 NOTE — Progress Notes (Signed)
Patient contacted with results of genetic testing.  Copy mailed to patient per her request.  Laura Roman reports that she is in the process of completing additional testing ordered by Dr Sula Rumple, in preperation for the start of chemotherapy, scheduled for May 26.

## 2020-03-08 ENCOUNTER — Ambulatory Visit (INDEPENDENT_AMBULATORY_CARE_PROVIDER_SITE_OTHER): Payer: Medicare Other | Admitting: Physician Assistant

## 2020-03-08 ENCOUNTER — Encounter: Payer: Self-pay | Admitting: Physician Assistant

## 2020-03-08 ENCOUNTER — Other Ambulatory Visit: Payer: Self-pay

## 2020-03-08 VITALS — BP 108/63 | HR 64 | Resp 14 | Ht 66.0 in | Wt 191.4 lb

## 2020-03-08 DIAGNOSIS — M5134 Other intervertebral disc degeneration, thoracic region: Secondary | ICD-10-CM

## 2020-03-08 DIAGNOSIS — M5030 Other cervical disc degeneration, unspecified cervical region: Secondary | ICD-10-CM

## 2020-03-08 DIAGNOSIS — M1712 Unilateral primary osteoarthritis, left knee: Secondary | ICD-10-CM

## 2020-03-08 DIAGNOSIS — G5601 Carpal tunnel syndrome, right upper limb: Secondary | ICD-10-CM

## 2020-03-08 DIAGNOSIS — M797 Fibromyalgia: Secondary | ICD-10-CM

## 2020-03-08 DIAGNOSIS — M5136 Other intervertebral disc degeneration, lumbar region: Secondary | ICD-10-CM | POA: Diagnosis not present

## 2020-03-08 DIAGNOSIS — M0609 Rheumatoid arthritis without rheumatoid factor, multiple sites: Secondary | ICD-10-CM

## 2020-03-08 DIAGNOSIS — M818 Other osteoporosis without current pathological fracture: Secondary | ICD-10-CM | POA: Diagnosis not present

## 2020-03-08 DIAGNOSIS — Z96651 Presence of right artificial knee joint: Secondary | ICD-10-CM

## 2020-03-08 DIAGNOSIS — Z9889 Other specified postprocedural states: Secondary | ICD-10-CM

## 2020-03-08 DIAGNOSIS — M542 Cervicalgia: Secondary | ICD-10-CM | POA: Diagnosis not present

## 2020-03-08 DIAGNOSIS — I7300 Raynaud's syndrome without gangrene: Secondary | ICD-10-CM

## 2020-03-08 DIAGNOSIS — Z8619 Personal history of other infectious and parasitic diseases: Secondary | ICD-10-CM

## 2020-03-08 DIAGNOSIS — Z79899 Other long term (current) drug therapy: Secondary | ICD-10-CM | POA: Diagnosis not present

## 2020-03-08 DIAGNOSIS — M1120 Other chondrocalcinosis, unspecified site: Secondary | ICD-10-CM | POA: Diagnosis not present

## 2020-03-08 DIAGNOSIS — N3010 Interstitial cystitis (chronic) without hematuria: Secondary | ICD-10-CM

## 2020-03-08 DIAGNOSIS — Z8679 Personal history of other diseases of the circulatory system: Secondary | ICD-10-CM

## 2020-03-08 DIAGNOSIS — Z8719 Personal history of other diseases of the digestive system: Secondary | ICD-10-CM

## 2020-03-08 DIAGNOSIS — R0602 Shortness of breath: Secondary | ICD-10-CM

## 2020-03-08 DIAGNOSIS — M545 Low back pain: Secondary | ICD-10-CM | POA: Diagnosis not present

## 2020-03-08 DIAGNOSIS — R5382 Chronic fatigue, unspecified: Secondary | ICD-10-CM

## 2020-03-08 LAB — CBC WITH DIFFERENTIAL/PLATELET
Absolute Monocytes: 608 cells/uL (ref 200–950)
Basophils Absolute: 77 cells/uL (ref 0–200)
Basophils Relative: 1.2 %
Eosinophils Absolute: 198 cells/uL (ref 15–500)
Eosinophils Relative: 3.1 %
HCT: 34.8 % — ABNORMAL LOW (ref 35.0–45.0)
Hemoglobin: 11 g/dL — ABNORMAL LOW (ref 11.7–15.5)
Lymphs Abs: 1690 cells/uL (ref 850–3900)
MCH: 27.7 pg (ref 27.0–33.0)
MCHC: 31.6 g/dL — ABNORMAL LOW (ref 32.0–36.0)
MCV: 87.7 fL (ref 80.0–100.0)
MPV: 10.9 fL (ref 7.5–12.5)
Monocytes Relative: 9.5 %
Neutro Abs: 3827 cells/uL (ref 1500–7800)
Neutrophils Relative %: 59.8 %
Platelets: 378 10*3/uL (ref 140–400)
RBC: 3.97 10*6/uL (ref 3.80–5.10)
RDW: 13.5 % (ref 11.0–15.0)
Total Lymphocyte: 26.4 %
WBC: 6.4 10*3/uL (ref 3.8–10.8)

## 2020-03-08 LAB — COMPLETE METABOLIC PANEL WITH GFR
AG Ratio: 1.9 (calc) (ref 1.0–2.5)
ALT: 15 U/L (ref 6–29)
AST: 20 U/L (ref 10–35)
Albumin: 4 g/dL (ref 3.6–5.1)
Alkaline phosphatase (APISO): 77 U/L (ref 37–153)
BUN: 9 mg/dL (ref 7–25)
CO2: 32 mmol/L (ref 20–32)
Calcium: 9.5 mg/dL (ref 8.6–10.4)
Chloride: 94 mmol/L — ABNORMAL LOW (ref 98–110)
Creat: 0.89 mg/dL (ref 0.50–0.99)
GFR, Est African American: 77 mL/min/{1.73_m2} (ref >=60)
GFR, Est Non African American: 67 mL/min/{1.73_m2} (ref >=60)
Globulin: 2.1 g/dL (calc) (ref 1.9–3.7)
Glucose, Bld: 139 mg/dL — ABNORMAL HIGH (ref 65–99)
Potassium: 3.5 mmol/L (ref 3.5–5.3)
Sodium: 133 mmol/L — ABNORMAL LOW (ref 135–146)
Total Bilirubin: 0.4 mg/dL (ref 0.2–1.2)
Total Protein: 6.1 g/dL (ref 6.1–8.1)

## 2020-03-08 NOTE — Patient Instructions (Signed)
Standing Labs We placed an order today for your standing lab work.    Please come back and get your standing labs in August and every 3 months   We have open lab daily Monday through Thursday from 8:30-12:30 PM and 1:30-4:30 PM and Friday from 8:30-12:30 PM and 1:30-4:00 PM at the office of Dr. Shaili Deveshwar.   You may experience shorter wait times on Monday and Friday afternoons. The office is located at [address], [address], [place], [place] [address] No appointment is necessary.   Labs are drawn by [facility].  You may receive a bill from [facility] for your lab work.  If you wish to have your labs drawn at another location, please call the office 24 hours in advance to send orders.  If you have any questions regarding directions or hours of operation,  please call [phone].   Just as a reminder please drink plenty of water prior to coming for your lab work. Thanks!   

## 2020-03-11 ENCOUNTER — Ambulatory Visit (INDEPENDENT_AMBULATORY_CARE_PROVIDER_SITE_OTHER): Payer: Medicare Other | Admitting: Family Medicine

## 2020-03-11 ENCOUNTER — Other Ambulatory Visit: Payer: Self-pay

## 2020-03-11 ENCOUNTER — Encounter: Payer: Self-pay | Admitting: Family Medicine

## 2020-03-11 ENCOUNTER — Encounter

## 2020-03-11 VITALS — BP 123/49 | HR 64 | Ht 66.0 in | Wt 171.0 lb

## 2020-03-11 DIAGNOSIS — R202 Paresthesia of skin: Secondary | ICD-10-CM

## 2020-03-11 DIAGNOSIS — I10 Essential (primary) hypertension: Secondary | ICD-10-CM | POA: Diagnosis not present

## 2020-03-11 DIAGNOSIS — M818 Other osteoporosis without current pathological fracture: Secondary | ICD-10-CM

## 2020-03-11 DIAGNOSIS — R7301 Impaired fasting glucose: Secondary | ICD-10-CM

## 2020-03-11 LAB — POCT GLYCOSYLATED HEMOGLOBIN (HGB A1C): Hemoglobin A1C: 6.3 % — AB (ref 4.0–5.6)

## 2020-03-11 MED ORDER — DENOSUMAB 60 MG/ML ~~LOC~~ SOSY
60.0000 mg | PREFILLED_SYRINGE | Freq: Once | SUBCUTANEOUS | Status: AC
  Administered 2020-03-11: 60 mg via SUBCUTANEOUS

## 2020-03-11 NOTE — Progress Notes (Signed)
Established Patient Office Visit  Subjective:  Patient ID: Mia [name], female    DOB: [birth year]  Age: 69 y.o. MRN: [id number]  CC:  Chief Complaint  Patient presents with  . ifg  . prolia injection    HPI Mia [name] presents for   Impaired fasting glucose-no increased thirst or urination. No symptoms consistent with hypoglycemia. She has been cutting back on carbs.    Hypertension- Pt denies chest pain, SOB, dizziness, or heart palpitations.  Taking meds as directed w/o problems.  Denies medication side effects.   Low up on lower extremity swelling-she has significant swelling and shortness of breath in fact resulted in an emergency department visit on April 29 I saw her the following day on the 30th.  We gave her an IM injection of Lasix that day and had her restart her hydrochlorothiazide which she had actually run out of. She is down 20 lbs.  Her SOB is much better.   Wanted to let me know that after physical therapy on Friday she had a significant increase in low back pain and started to get some numbness in her left anterior thigh.  By Saturday night it was really most of burning sensation and it bothered her throughout Sunday but then by today it actually just feels like it is back to the numbness sensation in the burning is resolved.  She was doing an exercise where she was lying on her back and working on some core strengthening with using a ball between her knees.  Past Medical History:  Diagnosis Date  . Anal fissure   . Anemia   . Arthritis   . Blood transfusion   . C. difficile colitis   . Chest pain    a. 01/2013 MV: EF 59%, no ischemia.  . Chronic Dyspnea    a. 01/2013 Echo: EF 60-65%, Gr 1 DD, PASP 35mmHg.// Echo 01/2020: EF 60-65, no RWMA, GR 1 DD, GLS -21.6%, normal RV SF, trivial MR   . COVID-19   . Diabetes mellitus   . Fibromyalgia   . Gall stones   . GERD (gastroesophageal reflux disease)    gastritis  . Hemorrhoids   . Hypertension   . Interstitial  cystitis   . MCI (mild cognitive impairment) 11/25/2017  . Memory difficulty 12/14/2013  . Neuromuscular disorder ([place])    sclerosis  . Osteoporosis   . Peptic ulcer   . PONV (postoperative nausea and vomiting)   . Pseudogout   . Raynaud phenomenon   . Rectal bleeding   . Sleep apnea    a. on cpap.  Marland Kitchen SVT (supraventricular tachycardia) ([place])   . Syncope 09/10/2015  . Thyroid disease    hypothyroidism  . Tremor, essential 05/14/2016    Past Surgical History:  Procedure Laterality Date  . APPENDECTOMY    . BLADDER SURGERY     x2  . BLADDER SURGERY    . BREAST BIOPSY    . CARDIAC CATHETERIZATION    . CARPAL TUNNEL RELEASE    . CATARACT EXTRACTION Bilateral   . CHOLECYSTECTOMY    . DILATION AND CURETTAGE OF UTERUS    . ENTEROCELE REPAIR     x2  . EYE SURGERY     retina  . hysterectomy - unknown type    . JOINT REPLACEMENT    . KNEE ARTHROPLASTY    . KNEE SURGERY     x6  . NECK SURGERY     fusion  . OOPHORECTOMY    .  QUADRICEPS REPAIR Right   . RECTOCELE REPAIR     x2  . SHOULDER ARTHROSCOPY WITH SUBACROMIAL DECOMPRESSION, ROTATOR CUFF REPAIR AND BICEP TENDON REPAIR  10/06/2012   Procedure: SHOULDER ARTHROSCOPY WITH SUBACROMIAL DECOMPRESSION, ROTATOR CUFF REPAIR AND BICEP TENDON REPAIR;  Surgeon: Nita Sells, MD;  Location: [facility];  Service: Orthopedics;  Laterality: Right;  Arthroscopic  Repair  of  Subscapularis, Open Biceps Tenodesis  . SHOULDER SURGERY     bilateral- bones spur  . TONSILLECTOMY    . TOTAL KNEE ARTHROPLASTY    . TOTAL SHOULDER ARTHROPLASTY      Family History  Problem Relation Age of Onset  . Heart attack Mother   . Stroke Mother   . Diabetes Mother   . Heart disease Mother   . Colon polyps Mother   . Asthma Mother   . Breast cancer Maternal Grandmother   . Wilson's disease Maternal Grandmother   . Pancreatic cancer Maternal Grandfather   . Heart disease Father   . Heart attack Father   . Asthma Father    . Stroke Sister   . Hypertension Sister   . Rheum arthritis Sister   . Dementia Sister   . Asthma Sister   . Lupus Sister   . Asthma Sister     Social History   Socioeconomic History  . Marital status: Married    Spouse name: Not on file  . Number of children: 2  . Years of education: 69  . Highest education level: Not on file  Occupational History  . Occupation: Retired  Tobacco Use  . Smoking status: Never Smoker  . Smokeless tobacco: Never Used  Substance and Sexual Activity  . Alcohol use: No    Alcohol/week: 0.0 standard drinks  . Drug use: No  . Sexual activity: Not on file  Other Topics Concern  . Not on file  Social History Narrative   Patient drinks about 3-4 cups of caffeine daily.   Patient is right handed.   Social Determinants of Health   Financial Resource Strain:   . Difficulty of Paying Living Expenses:   Food Insecurity:   . Worried About Charity fundraiser in the Last Year:   . Arboriculturist in the Last Year:   Transportation Needs:   . Film/video editor (Medical):   Marland Kitchen Lack of Transportation (Non-Medical):   Physical Activity:   . Days of Exercise per Week:   . Minutes of Exercise per Session:   Stress:   . Feeling of Stress :   Social Connections:   . Frequency of Communication with Friends and Family:   . Frequency of Social Gatherings with Friends and Family:   . Attends Religious Services:   . Active Member of Clubs or Organizations:   . Attends Archivist Meetings:   Marland Kitchen Marital Status:   Intimate Partner Violence:   . Fear of Current or Ex-Partner:   . Emotionally Abused:   Marland Kitchen Physically Abused:   . Sexually Abused:     Outpatient Medications Prior to Visit  Medication Sig Dispense Refill  . AMBULATORY NON FORMULARY MEDICATION Medication Name: One touch ultra strips. Check fasting blood sugar in the morning and as needed.  Dx - [id number] Fax to [phone] 50 strip 11  . atorvastatin (LIPITOR) 40 MG tablet TAKE 1  TABLET EVERY DAY 90 tablet 3  . Cyanocobalamin (VITAMIN B-12 IJ) Inject 1 mL as directed every 30 (thirty) days.     Marland Kitchen  denosumab (PROLIA) 60 MG/ML SOLN injection Inject 60 mg into the skin every 6 (six) months. Administer in upper arm, thigh, or abdomen    . donepezil (ARICEPT) 10 MG tablet TAKE 1 TABLET BY MOUTH AT BEDTIME (Patient taking differently: Take 10 mg by mouth at bedtime. ) 90 tablet 3  . fluticasone (FLONASE) 50 MCG/ACT nasal spray Place 2 sprays into both nostrils daily. (Patient taking differently: Place 2 sprays into both nostrils daily as needed. ) 16 g 6  . gabapentin (NEURONTIN) 300 MG capsule TAKE 1 TO 2 CAPSULES BY MOUTH UP TO TWO TIMES DAILY AS NEEDED (Patient taking differently: Take 300 mg by mouth daily. ) 180 capsule 3  . hydrochlorothiazide (HYDRODIURIL) 25 MG tablet Take 1 tablet (25 mg total) by mouth daily. 30 tablet 1  . leflunomide (ARAVA) 20 MG tablet Take 1 tablet (20 mg total) by mouth daily. 90 tablet 0  . losartan (COZAAR) 50 MG tablet Take 1 tablet (50 mg total) by mouth daily. Take 1 tablet in the morning and 1 tablet in evening as needed for blood pressure greater than 150 (Patient taking differently: Take 50 mg by mouth daily. ) 180 tablet 3  . Melatonin 10 MG CAPS Take 10 capsules by mouth daily.    . memantine (NAMENDA) 10 MG tablet TAKE 1 TABLET TWICE DAILY (Patient taking differently: Take 10 mg by mouth 2 (two) times daily. ) 180 tablet 4  . metFORMIN (GLUCOPHAGE-XR) 500 MG 24 hr tablet Take 500 mg by mouth daily with breakfast.    . metoprolol tartrate (LOPRESSOR) 100 MG tablet TAKE 1 AND 1/2 TABLETS TWICE DAILY (Patient taking differently: Take 150 mg by mouth 2 (two) times daily. ) 270 tablet 3  . ondansetron (ZOFRAN) 4 MG tablet Take 1 tablet (4 mg total) by mouth daily as needed for nausea or vomiting. 30 tablet 1  . ONE TOUCH ULTRA TEST test strip USE TO TEST FASTING BLOOD SUGAR IN THE MORNING AND AS NEEDED 50 each 11  . pantoprazole (PROTONIX) 40 MG  tablet Take 40 mg by mouth 2 (two) times daily.    . polyethylene glycol powder (MIRALAX) powder Take 17 g by mouth daily as needed for mild constipation or moderate constipation.     . primidone (MYSOLINE) 50 MG tablet TAKE 1 TABLET AT BEDTIME (Patient taking differently: Take 50 mg by mouth at bedtime. ) 90 tablet 1  . venlafaxine XR (EFFEXOR-XR) 75 MG 24 hr capsule TAKE 1 CAPSULE EVERY DAY WITH BREAKFAST (Patient taking differently: Take 75 mg by mouth daily with breakfast. ) 90 capsule 3   No facility-administered medications prior to visit.    Allergies  Allergen Reactions  . Codeine Nausea And Vomiting  . Myrbetriq  [Mirabegron] Other (See Comments)    SEVERE HEADACHE  . Percocet [Oxycodone-Acetaminophen] Nausea And Vomiting  . Celebrex [Celecoxib] Other (See Comments)    Other reaction(s): Other flushed  . Darvocet [Propoxyphene N-Acetaminophen] Nausea And Vomiting  . Erythromycin Nausea And Vomiting    Nausea and vomiting   . Hydrocodone Nausea And Vomiting  . Lyrica [Pregabalin] Swelling    Legs swelling   . Macrodantin [Nitrofurantoin] Nausea And Vomiting  . Toradol [Ketorolac Tromethamine] Nausea And Vomiting    Per patient, only PO form causes nausea and vomiting. Can take injection without issue  . Tramadol Nausea And Vomiting  . Verapamil Nausea And Vomiting    REACTION: intolerance    ROS Review of Systems    Objective:  Physical Exam  Constitutional: She is oriented to person, place, and time. She appears well-developed and well-nourished.  HENT:  Head: Normocephalic and atraumatic.  Cardiovascular: Normal rate, regular rhythm and normal heart sounds.  Pulmonary/Chest: Effort normal and breath sounds normal.  Musculoskeletal:     Comments: No ankle swelling today.    Neurological: She is alert and oriented to person, place, and time.  Skin: Skin is warm and dry.  Psychiatric: She has a normal mood and affect. Her behavior is normal.    BP (!)  123/49   Pulse 64   Ht 5\' 6"  (1.676 m)   Wt 171 lb (77.6 kg)   SpO2 96%   BMI 27.60 kg/m  Wt Readings from Last 3 Encounters:  03/11/20 171 lb (77.6 kg)  03/08/20 191 lb 6.4 oz (86.8 kg)  02/23/20 192 lb (87.1 kg)     Health Maintenance Due  Topic Date Due  . FOOT EXAM  11/15/2019    There are no preventive care reminders to display for this patient.  Lab Results  Component Value Date   TSH 1.43 09/12/2019   Lab Results  Component Value Date   WBC 6.4 03/08/2020   HGB 11.0 (L) 03/08/2020   HCT 34.8 (L) 03/08/2020   MCV 87.7 03/08/2020   PLT 378 03/08/2020   Lab Results  Component Value Date   NA 133 (L) 03/08/2020   K 3.5 03/08/2020   CO2 32 03/08/2020   GLUCOSE 139 (H) 03/08/2020   BUN 9 03/08/2020   CREATININE 0.89 03/08/2020   BILITOT 0.4 03/08/2020   ALKPHOS 84 11/16/2019   AST 20 03/08/2020   ALT 15 03/08/2020   PROT 6.1 03/08/2020   ALBUMIN 3.7 11/16/2019   CALCIUM 9.5 03/08/2020   ANIONGAP 12 02/22/2020   GFR 71.66 04/03/2015   Lab Results  Component Value Date   CHOL 128 06/07/2019   Lab Results  Component Value Date   HDL 52 06/07/2019   Lab Results  Component Value Date   LDLCALC 51 06/07/2019   Lab Results  Component Value Date   TRIG 97 11/07/2019   Lab Results  Component Value Date   CHOLHDL 2.5 06/07/2019   Lab Results  Component Value Date   HGBA1C 6.3 (A) 03/11/2020      Assessment & Plan:   Problem List Items Addressed This Visit      Cardiovascular and Mediastinum   Hypertension    Well controlled. Continue current regimen. Follow up in  3 months      Relevant Orders   BASIC METABOLIC PANEL WITH GFR     Endocrine   IFG (impaired fasting glucose) - Primary    Up from previous.  Now 6.3.  She has recently started cutting back on carbs.  Just encouraged her to continue to stick with this and work at it.  She really like to get down to about 160 pounds and work on some weight loss when she is feeling completely  back to herself again.      Relevant Orders   POCT glycosylated hemoglobin (Hb A1C) (Completed)     Musculoskeletal and Integument   Osteoporosis    Other Visit Diagnoses    Paresthesia of left leg          Paresthesia of left anterior thigh  -  still feels a little bit numb today.  Encouraged her to just cannot get it through the week with resting it especially since her back flared and she  was having significant increased pain over the weekend she says its been gradually getting better some hoping that the numbness will resolve as well.  Suspect that she probably has a herniated disc causing the symptoms.  If is not improving before the weekend then recommend a trial of oral prednisone.  Also encouraged her to go back to physical therapy and just let them know what happened but they can but we can continue to work on her back pain.  Meds ordered this encounter  Medications  . denosumab (PROLIA) injection 60 mg    Follow-up: Return in about 3 months (around 06/11/2020) for Hypertension.    [name], MD

## 2020-03-11 NOTE — Assessment & Plan Note (Signed)
Well controlled. Continue current regimen. Follow up in  3 months.  

## 2020-03-11 NOTE — Patient Instructions (Addendum)
Next B12 injection due: 03/22/2020 Prolia injection due: 09/11/2020

## 2020-03-11 NOTE — Assessment & Plan Note (Signed)
Up from previous.  Now 6.3.  She has recently started cutting back on carbs.  Just encouraged her to continue to stick with this and work at it.  She really like to get down to about 160 pounds and work on some weight loss when she is feeling completely back to herself again.

## 2020-03-11 NOTE — Progress Notes (Signed)
Anemia is stable.  Sodium and chloride borderline low. Glucose is elevated-139.  Please notify the patient and forward labs to PCP.

## 2020-03-12 DIAGNOSIS — M545 Low back pain: Secondary | ICD-10-CM | POA: Diagnosis not present

## 2020-03-12 DIAGNOSIS — M542 Cervicalgia: Secondary | ICD-10-CM | POA: Diagnosis not present

## 2020-03-15 DIAGNOSIS — M542 Cervicalgia: Secondary | ICD-10-CM | POA: Diagnosis not present

## 2020-03-15 DIAGNOSIS — M545 Low back pain: Secondary | ICD-10-CM | POA: Diagnosis not present

## 2020-03-16 ENCOUNTER — Other Ambulatory Visit: Payer: Self-pay | Admitting: Family Medicine

## 2020-03-19 DIAGNOSIS — M542 Cervicalgia: Secondary | ICD-10-CM | POA: Diagnosis not present

## 2020-03-19 DIAGNOSIS — M545 Low back pain: Secondary | ICD-10-CM | POA: Diagnosis not present

## 2020-03-21 DIAGNOSIS — M542 Cervicalgia: Secondary | ICD-10-CM | POA: Diagnosis not present

## 2020-03-21 DIAGNOSIS — M545 Low back pain: Secondary | ICD-10-CM | POA: Diagnosis not present

## 2020-03-22 ENCOUNTER — Ambulatory Visit: Payer: Medicare Other

## 2020-03-22 ENCOUNTER — Other Ambulatory Visit: Payer: Self-pay

## 2020-03-22 ENCOUNTER — Ambulatory Visit (INDEPENDENT_AMBULATORY_CARE_PROVIDER_SITE_OTHER): Payer: Medicare Other | Admitting: Family Medicine

## 2020-03-22 VITALS — BP 111/66 | HR 61

## 2020-03-22 DIAGNOSIS — D510 Vitamin B12 deficiency anemia due to intrinsic factor deficiency: Secondary | ICD-10-CM

## 2020-03-22 MED ORDER — CYANOCOBALAMIN 1000 MCG/ML IJ SOLN
1000.0000 ug | Freq: Once | INTRAMUSCULAR | Status: AC
  Administered 2020-03-22: 1000 ug via INTRAMUSCULAR

## 2020-03-22 NOTE — Progress Notes (Signed)
Patient is here for Vitamin b12 injection, denies gastrointestinal problems or dizziness. Patient tolerated injection well on RUOQ with complications. Patient advised to schedule next injection in 30 days.

## 2020-03-22 NOTE — Progress Notes (Signed)
Agree with documentation as above.   Zamoria Boss, MD  

## 2020-03-23 ENCOUNTER — Other Ambulatory Visit: Payer: Self-pay | Admitting: Family Medicine

## 2020-03-26 DIAGNOSIS — G5601 Carpal tunnel syndrome, right upper limb: Secondary | ICD-10-CM | POA: Diagnosis not present

## 2020-03-27 DIAGNOSIS — M542 Cervicalgia: Secondary | ICD-10-CM | POA: Diagnosis not present

## 2020-03-27 DIAGNOSIS — M545 Low back pain: Secondary | ICD-10-CM | POA: Diagnosis not present

## 2020-03-28 ENCOUNTER — Inpatient Hospital Stay: Attending: Hematology & Oncology | Primary: Internal Medicine

## 2020-03-28 ENCOUNTER — Ambulatory Visit: Payer: MEDICARE | Primary: Internal Medicine

## 2020-03-28 ENCOUNTER — Inpatient Hospital Stay: Payer: MEDICARE | Attending: Hematology & Oncology | Primary: Internal Medicine

## 2020-03-28 DIAGNOSIS — M48061 Spinal stenosis, lumbar region without neurogenic claudication: Secondary | ICD-10-CM | POA: Diagnosis not present

## 2020-03-28 DIAGNOSIS — M47816 Spondylosis without myelopathy or radiculopathy, lumbar region: Secondary | ICD-10-CM | POA: Diagnosis not present

## 2020-03-28 DIAGNOSIS — M545 Low back pain: Secondary | ICD-10-CM | POA: Diagnosis not present

## 2020-03-28 DIAGNOSIS — Z23 Encounter for immunization: Secondary | ICD-10-CM | POA: Diagnosis not present

## 2020-03-28 DIAGNOSIS — M7138 Other bursal cyst, other site: Secondary | ICD-10-CM | POA: Diagnosis not present

## 2020-03-29 ENCOUNTER — Other Ambulatory Visit: Payer: Self-pay | Admitting: Family Medicine

## 2020-04-19 ENCOUNTER — Ambulatory Visit (INDEPENDENT_AMBULATORY_CARE_PROVIDER_SITE_OTHER): Payer: Medicare Other | Admitting: Family Medicine

## 2020-04-19 ENCOUNTER — Other Ambulatory Visit: Payer: Self-pay

## 2020-04-19 ENCOUNTER — Encounter: Payer: Self-pay | Admitting: Cardiovascular Disease

## 2020-04-19 ENCOUNTER — Ambulatory Visit (INDEPENDENT_AMBULATORY_CARE_PROVIDER_SITE_OTHER): Payer: Medicare Other | Admitting: Cardiovascular Disease

## 2020-04-19 VITALS — BP 114/68 | HR 56 | Ht 66.0 in | Wt 187.5 lb

## 2020-04-19 VITALS — BP 125/70 | HR 58 | Wt 190.0 lb

## 2020-04-19 DIAGNOSIS — D510 Vitamin B12 deficiency anemia due to intrinsic factor deficiency: Secondary | ICD-10-CM

## 2020-04-19 DIAGNOSIS — I5032 Chronic diastolic (congestive) heart failure: Secondary | ICD-10-CM | POA: Diagnosis not present

## 2020-04-19 DIAGNOSIS — R000 Tachycardia, unspecified: Secondary | ICD-10-CM

## 2020-04-19 DIAGNOSIS — I471 Supraventricular tachycardia: Secondary | ICD-10-CM | POA: Diagnosis not present

## 2020-04-19 DIAGNOSIS — I10 Essential (primary) hypertension: Secondary | ICD-10-CM

## 2020-04-19 LAB — BASIC METABOLIC PANEL WITH GFR
BUN: 10 mg/dL (ref 7–25)
CO2: 29 mmol/L (ref 20–32)
Calcium: 9 mg/dL (ref 8.6–10.4)
Chloride: 91 mmol/L — ABNORMAL LOW (ref 98–110)
Creat: 0.85 mg/dL (ref 0.50–0.99)
GFR, Est African American: 82 mL/min/{1.73_m2} (ref >=60)
GFR, Est Non African American: 70 mL/min/{1.73_m2} (ref >=60)
Glucose, Bld: 96 mg/dL (ref 65–99)
Potassium: 4 mmol/L (ref 3.5–5.3)
Sodium: 130 mmol/L — ABNORMAL LOW (ref 135–146)

## 2020-04-19 MED ORDER — CYANOCOBALAMIN 1000 MCG/ML IJ SOLN
1000.0000 ug | Freq: Once | INTRAMUSCULAR | Status: AC
  Administered 2020-04-19: 1000 ug via INTRAMUSCULAR

## 2020-04-19 NOTE — Patient Instructions (Signed)
Medication Instructions:  No changes *If you need a refill on your cardiac medications before your next appointment, please call your pharmacy*   Lab Work: none  Testing/Procedures: none   Follow-Up: At [facility], you and your health needs are our priority.  As part of our continuing mission to provide you with exceptional heart care, we have created designated Provider Care Teams.  These Care Teams include your primary Cardiologist (physician) and Advanced Practice Providers (APPs -  Physician Assistants and Nurse Practitioners) who all work together to provide you with the care you need, when you need it.  Your next appointment:   6 month(s)  The format for your next appointment:   In Person  Provider:   You may see Mertie Moores, MD or one of the following Advanced Practice Providers on your designated Care Team:    Richardson Dopp, PA-C  Robbie Lis, [place]    Other Instructions You have been referred to Electrophysiology (Dr. Lovena Le)

## 2020-04-19 NOTE — Progress Notes (Signed)
Patient is here for Vitamin b12 injection, denies gastrointestinal problems or dizziness.  Patient tolerated injection well on LUOQ without complications.  Patient advised to schedule next injection in 30 days.

## 2020-04-19 NOTE — Progress Notes (Signed)
Cardiology Office Note   Date:  04/19/2020   ID:  Mia [name], DOB 02-03-1951, MRN [id number]  PCP:  Hali Marry, MD  Cardiologist:   Mertie Moores, MD   Chief Complaint  Patient presents with  . Hypertension  . Tachycardia  . Hyperlipidemia   1. SVT 2. Raynaud's Phenomenon 3. Diabetes Mellitus 4. Minimal CAD by cath 2006 [name]) 5. Sleep apnea - CPAP is currently not working  02/11/05 Right heart catheterization:  RA pressure: 8 mmHg  RV pressure: 25/7 mmHg  PA pressure: 20/9 mmHg  PWCP: 9 mmHg  CO: 4.6 liters per minute  LEFT HEART CATHETERIZATION RESULTS:  1. Left main: No significant disease.  2. LAD: Mild luminal irregularities.  3. First and second diagonal: Moderate size, mild luminal irregularities.  4. Left circumflex: Nondominant, mild luminal irregularities.  5. RCA: Dominant with mild luminal irregularities.  6. LV: EF is 55 to 60%, no wall motion abnormalities. LV EDP was 9 mmHg.   December 28, 2014:  Mia [name] is a 69 y.o. female who presents for follow-up with an episode of chest pain. She was seen by our nurse practitioner, Ignacia Bayley.    Diltiazem was DC'd.   She was started on Metoprolol 50 bid.   Myoview study was normal. She has normal left ventricle systolic function. She is going to the [facility] 3 times a week.  Doing well.     She is feeling much better.    Sept. 1, 2016:  Has had C diff for the past several months .  Doing well from a cardiac standpoint .  Has been fatigued.  Doing well with the metoprolol  Still has shortness of breath .   Nov. 3, 2017:    Mia [name] is seen today with her husband, [name].   Last week she started having some heart facing .  HR was 187 (using the app on her phone )  Could feel it in her ears. Got a little dizzy.  Sat down and relaxed and the episode resolved.  Had another episode this past Sunday .  HR stayed elevated for 2 hours   Aug. 24, 2018: Mia [name] is seen today with husband  [name].   Followed for HTN and hyperlipidemia  Has had some elevated diastolic BP readings . Eats some salty foods still - likes ham. Complains of leg pain / ache when she is standing .    Nov. 16, 2018  Mia [name] was having more SVT, we have increased the metoprolol  Up to metoprolol 100 mg BID. She is much better controlled at this point Wearing the event monitor   December 08, 2017:  Mia [name] is seen today for follow-up of her supraventricular tachycardia  We had increased her metoprolol to 100 mg BID  Has occasional palpitations  No CP ,  No severe dyspnea. Does has some DOE that is likely due to generalized deconditioning  Needs left knee replacement this summer. Needs carple tunnel surgery in both wrists.  Has episodes of diaphoresis   Mar 11, 2018: Mia [name] is seen today for follow-up of her supraventricular tachycardia.  We increased her metoprolol up to 150 mill grams twice a day during her last visit. Tolerating the metoprolol well  BP is still is elevated at times.     Eats lots of sandwiches, salami,  Her diet is "terrible"  According to her . - eats snacks instead of real meals, sandwiches,   She is surprised that she hasnt lost  any weight .   Nov. 4,, 2019:  Mia [name] is here follow-up with her supraventricular tachycardia and hypertension. Palpitations have improved . BP is still elevated.    Avoids salt for the most part  Still eats Nabs crackers regularly   Feb. 9. 2021  Mia [name] is seen today for follow-up visit. She has a history of supraventricular tachycardia.  She has a history of sleep apnea and is on CPAP. She tested positive for Covid on November 07, 2019.  She had symptoms of diarrhea, nausea, vomiting and inability to take p.o. food.  She was in [facility] for 6 days.  BP was elevated in the hospital  Was started on amlodipine.   Doubled her Losartan ( was to take at night)  She was seen by Pecolia Ades, NP on February 1  When she saw [name]'s last week  she was having problems with hypertension.  Amlodipine had been added. She continued to have lots of fatigue.  BP is  Is eating and drinking normally   April 19, 2020:  Mia [name] is seen today for follow-up of her hypertension and history of SVT.  She tested positive for Covid in January, 2021.  She has been gradually improving.  She had significant leg edema and dysnea .  Saw her primary MD who gave her some IV lasix and started HCTZ.  She felt better after diuresing 20-25 lbs.  Still having episodes of tachycardia - despite being on metoprolol 150 mg BID  We discussed referral to EP for further eval of this    Past Medical History:  Diagnosis Date  . Anal fissure   . Anemia   . Arthritis   . Blood transfusion   . C. difficile colitis   . Chest pain    a. 01/2013 MV: EF 59%, no ischemia.  . Chronic Dyspnea    a. 01/2013 Echo: EF 60-65%, Gr 1 DD, PASP 19mmHg.// Echo 01/2020: EF 60-65, no RWMA, GR 1 DD, GLS -21.6%, normal RV SF, trivial MR   . COVID-19   . Diabetes mellitus   . Fibromyalgia   . Gall stones   . GERD (gastroesophageal reflux disease)    gastritis  . Hemorrhoids   . Hypertension   . Interstitial cystitis   . MCI (mild cognitive impairment) 11/25/2017  . Memory difficulty 12/14/2013  . Neuromuscular disorder ([place])    sclerosis  . Osteoporosis   . Peptic ulcer   . PONV (postoperative nausea and vomiting)   . Pseudogout   . Raynaud phenomenon   . Rectal bleeding   . Sleep apnea    a. on cpap.  Marland Kitchen SVT (supraventricular tachycardia) ([place])   . Syncope 09/10/2015  . Thyroid disease    hypothyroidism  . Tremor, essential 05/14/2016    Past Surgical History:  Procedure Laterality Date  . APPENDECTOMY    . BLADDER SURGERY     x2  . BLADDER SURGERY    . BREAST BIOPSY    . CARDIAC CATHETERIZATION    . CARPAL TUNNEL RELEASE    . CATARACT EXTRACTION Bilateral   . CHOLECYSTECTOMY    . DILATION AND CURETTAGE OF UTERUS    . ENTEROCELE REPAIR     x2  . EYE SURGERY      retina  . hysterectomy - unknown type    . JOINT REPLACEMENT    . KNEE ARTHROPLASTY    . KNEE SURGERY     x6  . NECK SURGERY     fusion  .  OOPHORECTOMY    . QUADRICEPS REPAIR Right   . RECTOCELE REPAIR     x2  . SHOULDER ARTHROSCOPY WITH SUBACROMIAL DECOMPRESSION, ROTATOR CUFF REPAIR AND BICEP TENDON REPAIR  10/06/2012   Procedure: SHOULDER ARTHROSCOPY WITH SUBACROMIAL DECOMPRESSION, ROTATOR CUFF REPAIR AND BICEP TENDON REPAIR;  Surgeon: Nita Sells, MD;  Location: [place];  Service: Orthopedics;  Laterality: Right;  Arthroscopic  Repair  of  Subscapularis, Open Biceps Tenodesis  . SHOULDER SURGERY     bilateral- bones spur  . TONSILLECTOMY    . TOTAL KNEE ARTHROPLASTY    . TOTAL SHOULDER ARTHROPLASTY       Current Outpatient Medications  Medication Sig Dispense Refill  . AMBULATORY NON FORMULARY MEDICATION Medication Name: One touch ultra strips. Check fasting blood sugar in the morning and as needed.  Dx - [id number] Fax to [phone] 50 strip 11  . atorvastatin (LIPITOR) 40 MG tablet TAKE 1 TABLET EVERY DAY 90 tablet 3  . Cyanocobalamin (VITAMIN B-12 IJ) Inject 1 mL as directed every 30 (thirty) days.     Marland Kitchen denosumab (PROLIA) 60 MG/ML SOLN injection Inject 60 mg into the skin every 6 (six) months. Administer in upper arm, thigh, or abdomen    . donepezil (ARICEPT) 10 MG tablet TAKE 1 TABLET BY MOUTH AT BEDTIME 90 tablet 3  . fluticasone (FLONASE) 50 MCG/ACT nasal spray Place 2 sprays into both nostrils daily. 16 g 6  . gabapentin (NEURONTIN) 300 MG capsule TAKE 1 TO 2 CAPSULES BY MOUTH UP TO TWO TIMES DAILY AS NEEDED 180 capsule 3  . hydrochlorothiazide (HYDRODIURIL) 25 MG tablet TAKE 1 TABLET BY MOUTH EVERY DAY 30 tablet 1  . leflunomide (ARAVA) 20 MG tablet Take 1 tablet (20 mg total) by mouth daily. 90 tablet 0  . losartan (COZAAR) 50 MG tablet Take 1 tablet (50 mg total) by mouth daily. Take 1 tablet in the morning and 1 tablet in evening as  needed for blood pressure greater than 150 180 tablet 3  . Melatonin 10 MG CAPS Take 10 capsules by mouth daily.    . memantine (NAMENDA) 10 MG tablet TAKE 1 TABLET TWICE DAILY 180 tablet 4  . metFORMIN (GLUCOPHAGE-XR) 500 MG 24 hr tablet Take 500 mg by mouth daily with breakfast.    . metoprolol tartrate (LOPRESSOR) 100 MG tablet TAKE 1 AND 1/2 TABLETS TWICE DAILY 270 tablet 3  . ondansetron (ZOFRAN) 4 MG tablet Take 1 tablet (4 mg total) by mouth daily as needed for nausea or vomiting. 30 tablet 1  . ONETOUCH ULTRA test strip USE TO TEST FASTING BLOOD SUGAR IN THE MORNING AND AS NEEDED 50 strip 11  . pantoprazole (PROTONIX) 40 MG tablet Take 40 mg by mouth 2 (two) times daily.    . polyethylene glycol powder (MIRALAX) powder Take 17 g by mouth daily as needed for mild constipation or moderate constipation.     . primidone (MYSOLINE) 50 MG tablet TAKE 1 TABLET AT BEDTIME 90 tablet 1  . venlafaxine XR (EFFEXOR-XR) 75 MG 24 hr capsule TAKE 1 CAPSULE EVERY DAY WITH BREAKFAST 90 capsule 3   No current facility-administered medications for this visit.    Allergies:   Codeine, Myrbetriq  [mirabegron], Percocet [oxycodone-acetaminophen], Celebrex [celecoxib], Darvocet [propoxyphene n-acetaminophen], Erythromycin, Hydrocodone, Lyrica [pregabalin], Macrodantin [nitrofurantoin], Toradol [ketorolac tromethamine], Tramadol, and Verapamil    Social History:  The patient  reports that she has never smoked. She has never used smokeless tobacco. She reports that she does not  drink alcohol and does not use drugs.   Family History:  The patient's family history includes Asthma in her father, mother, sister, and sister; Breast cancer in her maternal grandmother; Colon polyps in her mother; Dementia in her sister; Diabetes in her mother; Heart attack in her father and mother; Heart disease in her father and mother; Hypertension in her sister; Lupus in her sister; Pancreatic cancer in her maternal grandfather; Rheum  arthritis in her sister; Stroke in her mother and sister; Wilson's disease in her maternal grandmother.    ROS: Noted in current history, all other systems are negative.  Physical Exam: Blood pressure 114/68, pulse (!) 56, height 5\' 6"  (1.676 m), weight 187 lb 8 oz (85 kg), SpO2 96 %.  GEN:  Well nourished, well developed in no acute distress HEENT: Normal NECK: No JVD; No carotid bruits LYMPHATICS: No lymphadenopathy CARDIAC: RRR , no murmurs, rubs, gallops RESPIRATORY:  Clear to auscultation without rales, wheezing or rhonchi  ABDOMEN: Soft, non-tender, non-distended MUSCULOSKELETAL:  No edema; No deformity  SKIN: Warm and dry NEUROLOGIC:  Alert and oriented x 3   EKG:        Recent Labs: 09/12/2019: TSH 1.43 11/06/2019: Magnesium 2.1 01/23/2020: NT-Pro BNP 119 02/22/2020: B Natriuretic Peptide 28.0 03/08/2020: ALT 15; BUN 9; Creat 0.89; Hemoglobin 11.0; Platelets 378; Potassium 3.5; Sodium 133    Lipid Panel    Component Value Date/Time   CHOL 128 06/07/2019 0934   TRIG 97 11/07/2019 0004   HDL 52 06/07/2019 0934   CHOLHDL 2.5 06/07/2019 0934   CHOLHDL 3.2 08/28/2016 1558   VLDL 22 08/28/2016 1558   LDLCALC 51 06/07/2019 0934      Wt Readings from Last 3 Encounters:  04/19/20 187 lb 8 oz (85 kg)  03/11/20 171 lb (77.6 kg)  03/08/20 191 lb 6.4 oz (86.8 kg)      Other studies Reviewed: Additional studies/ records that were reviewed today include: . Review of the above records demonstrates:    ASSESSMENT AND PLAN:  1. SVT -      Bridey continues to have episodes of supraventricular tachycardia despite being on metoprolol 300 mg a day.  I would like to refer her to EP to get an opinion on what other treatments.  Perhaps the addition of flecainide or another antiarrhythmic medication.  We will see what electrophysiology has to offer.  Since her episodes are relatively brief I am not sure that ablation would necessarily be indicated but that certainly a  possibility.  2.  Hypertension:    Blood pressure is much better with the addition of HCTZ.  We will continue for now.  She is having some episodes of orthostatic hypotension.  If she continues to have orthostatic hypotension or if it worsens we may need to decrease the HCTZ to every other day.  4. Minimal CAD by cath 2006 [name]): Her last LDL was 51.  Continue current medications.       6. Dyspnea on exertion:     Current medicines are reviewed at length with the patient today.  The patient does not have concerns regarding medicines.  The following changes have been made:  no change  Disposition:   FU with me in 6 months     Mertie Moores, MD  04/19/2020 10:50 AM    [facility] [place], [place], [place]  [address] Phone: [phone]; Fax: [phone]

## 2020-04-19 NOTE — Progress Notes (Signed)
Agree with documentation as above.   Tristin Vandeusen, MD  

## 2020-04-22 NOTE — Progress Notes (Signed)
Hey [name],   Dr. Jerilynn Mages is out of town. This is Mia [name]. Your sodium and chloride are low in labs. Any recent med changes? For now increase sodium in diet and recheck in 1 week. Are you symptomatic or feeling bad?

## 2020-04-23 ENCOUNTER — Other Ambulatory Visit: Payer: Self-pay | Admitting: Physician Assistant

## 2020-04-23 DIAGNOSIS — E871 Hypo-osmolality and hyponatremia: Secondary | ICD-10-CM

## 2020-04-23 NOTE — Progress Notes (Signed)
In response to labs, no change in meds. States "sometimes I feel drunk". When walking or standing up, she feels like she needs to stagger, uses her husband to keep balance.   Advised to increase sodium in diet and call if any new or worsening of SX. Blood work entered

## 2020-04-25 ENCOUNTER — Other Ambulatory Visit: Payer: Self-pay | Admitting: Family Medicine

## 2020-04-26 ENCOUNTER — Other Ambulatory Visit: Payer: Self-pay | Admitting: Family Medicine

## 2020-04-26 LAB — BASIC METABOLIC PANEL
BUN/Creatinine Ratio: 8 (calc) (ref 6–22)
BUN: 6 mg/dL — ABNORMAL LOW (ref 7–25)
CO2: 31 mmol/L (ref 20–32)
Calcium: 9.9 mg/dL (ref 8.6–10.4)
Chloride: 90 mmol/L — ABNORMAL LOW (ref 98–110)
Creat: 0.76 mg/dL (ref 0.50–0.99)
Glucose, Bld: 106 mg/dL (ref 65–139)
Potassium: 4.1 mmol/L (ref 3.5–5.3)
Sodium: 129 mmol/L — ABNORMAL LOW (ref 135–146)

## 2020-04-30 NOTE — Progress Notes (Signed)
This is a patient while you were out I had her increase her sodium in diet and recheck labs. These resulted this morning with no improvement. I thought you may want to start work up or you could have a better understanding of where the hyponatremia is coming from.

## 2020-05-03 DIAGNOSIS — G5601 Carpal tunnel syndrome, right upper limb: Secondary | ICD-10-CM | POA: Diagnosis not present

## 2020-05-06 ENCOUNTER — Other Ambulatory Visit: Payer: Self-pay | Admitting: Neurology

## 2020-05-07 ENCOUNTER — Other Ambulatory Visit: Payer: Self-pay | Admitting: *Deleted

## 2020-05-07 DIAGNOSIS — E871 Hypo-osmolality and hyponatremia: Secondary | ICD-10-CM

## 2020-05-14 DIAGNOSIS — M542 Cervicalgia: Secondary | ICD-10-CM | POA: Diagnosis not present

## 2020-05-14 DIAGNOSIS — M545 Low back pain: Secondary | ICD-10-CM | POA: Diagnosis not present

## 2020-05-16 ENCOUNTER — Other Ambulatory Visit: Payer: Self-pay

## 2020-05-16 ENCOUNTER — Ambulatory Visit (INDEPENDENT_AMBULATORY_CARE_PROVIDER_SITE_OTHER): Payer: Medicare Other | Admitting: Family Medicine

## 2020-05-16 VITALS — BP 145/62 | HR 56

## 2020-05-16 DIAGNOSIS — D510 Vitamin B12 deficiency anemia due to intrinsic factor deficiency: Secondary | ICD-10-CM | POA: Diagnosis not present

## 2020-05-16 MED ORDER — CYANOCOBALAMIN 1000 MCG/ML IJ SOLN
1000.0000 ug | Freq: Once | INTRAMUSCULAR | Status: AC
  Administered 2020-05-16: 1000 ug via INTRAMUSCULAR

## 2020-05-16 NOTE — Progress Notes (Signed)
Patient is here for a vitamin B12 injection. Denies gastrointestinal problems or dizziness. B12 injection to [facility] per patient request with no apparent complications. Patient advised to schedule next injection in 30 days.  ° °

## 2020-05-16 NOTE — Progress Notes (Signed)
Agree with documentation as above.   Arneshia Ade, MD  

## 2020-05-21 ENCOUNTER — Encounter: Payer: Self-pay | Admitting: Internal Medicine

## 2020-05-21 ENCOUNTER — Other Ambulatory Visit: Payer: Self-pay

## 2020-05-21 ENCOUNTER — Ambulatory Visit (INDEPENDENT_AMBULATORY_CARE_PROVIDER_SITE_OTHER): Payer: Medicare Other | Admitting: Internal Medicine

## 2020-05-21 VITALS — BP 148/72 | HR 62 | Ht 68.0 in | Wt 188.0 lb

## 2020-05-21 DIAGNOSIS — E871 Hypo-osmolality and hyponatremia: Secondary | ICD-10-CM

## 2020-05-21 DIAGNOSIS — M542 Cervicalgia: Secondary | ICD-10-CM | POA: Diagnosis not present

## 2020-05-21 DIAGNOSIS — M545 Low back pain: Secondary | ICD-10-CM | POA: Diagnosis not present

## 2020-05-21 LAB — COMPREHENSIVE METABOLIC PANEL
ALT: 25 U/L (ref 0–35)
AST: 26 U/L (ref 0–37)
Albumin: 4 g/dL (ref 3.5–5.2)
Alkaline Phosphatase: 73 U/L (ref 39–117)
BUN: 12 mg/dL (ref 6–23)
CO2: 29 mEq/L (ref 19–32)
Calcium: 9.8 mg/dL (ref 8.4–10.5)
Chloride: 92 mEq/L — ABNORMAL LOW (ref 96–112)
Creatinine, Ser: 0.77 mg/dL (ref 0.40–1.20)
GFR: 74.39 mL/min (ref >60.00)
Glucose, Bld: 113 mg/dL — ABNORMAL HIGH (ref 70–99)
Potassium: 3.5 mEq/L (ref 3.5–5.1)
Sodium: 130 mEq/L — ABNORMAL LOW (ref 135–145)
Total Bilirubin: 0.4 mg/dL (ref 0.2–1.2)
Total Protein: 6.4 g/dL (ref 6.0–8.3)

## 2020-05-21 LAB — TSH: TSH: 1.34 u[IU]/mL (ref 0.35–4.50)

## 2020-05-21 MED ORDER — LOSARTAN POTASSIUM 50 MG PO TABS: 100.0000 mg | ORAL_TABLET | Freq: Every day | ORAL | 0 refills | Status: DC

## 2020-05-21 NOTE — Patient Instructions (Addendum)
-   Please Limit liquid intake to 60 ounce daily  - STOP Hydrochlorothiazide - Increase Losartan 2 tablets daily

## 2020-05-21 NOTE — Progress Notes (Signed)
Name: Mia [name]  MRN/ DOB: [id number], February 02, 1951    Age/ Sex: 69 y.o., female    PCP: Hali Marry, MD   Reason for Endocrinology Evaluation: Hyponatremia      Date of Initial Endocrinology Evaluation: 05/21/2020     HPI: Mia [name] is a 69 y.o. female with a past medical history of emphysema, OSA on CPAP , SVT and Raynaud's phenomenon, RA ,and memory loss . The patient presented for initial endocrinology clinic visit on 05/21/2020 for consultative assistance with her hyponatremia   Pt has been noted with hyponatremia in 02/2020 with a nadir of 129 mmol/L.   Today Mia [name] is accompanied by her husband,who provided a lot of the history, Mia [name] has occasional dizziness and staggers around.  Has frequent nausea as well as polydipsia Has frequency with nocturia 1-4 x   Started eating crushed ice ~ 2 months ago, around 60- 80 ounce a day (pours water on it)  Drinks ~ 40 ounce a day ( diet coke) .   Had COVID infection in 10/2019 requiring hospitalization  Has occasional diarrhea   Had edema in the past this is when Mia [name] was started on HCTZ  HISTORY:  Past Medical History:  Past Medical History:  Diagnosis Date  . Anal fissure   . Anemia   . Arthritis   . Blood transfusion   . C. difficile colitis   . Chest pain    a. 01/2013 MV: EF 59%, no ischemia.  . Chronic Dyspnea    a. 01/2013 Echo: EF 60-65%, Gr 1 DD, PASP 39mmHg.// Echo 01/2020: EF 60-65, no RWMA, GR 1 DD, GLS -21.6%, normal RV SF, trivial MR   . COVID-19   . Diabetes mellitus   . Fibromyalgia   . Gall stones   . GERD (gastroesophageal reflux disease)    gastritis  . Hemorrhoids   . Hypertension   . Interstitial cystitis   . MCI (mild cognitive impairment) 11/25/2017  . Memory difficulty 12/14/2013  . Neuromuscular disorder ([place])    sclerosis  . Osteoporosis   . Peptic ulcer   . PONV (postoperative nausea and vomiting)   . Pseudogout   . Raynaud phenomenon   . Rectal bleeding   . Sleep apnea    a.  on cpap.  Marland Kitchen SVT (supraventricular tachycardia) ([place])   . Syncope 09/10/2015  . Thyroid disease    hypothyroidism  . Tremor, essential 05/14/2016   Past Surgical History:  Past Surgical History:  Procedure Laterality Date  . APPENDECTOMY    . BLADDER SURGERY     x2  . BLADDER SURGERY    . BREAST BIOPSY    . CARDIAC CATHETERIZATION    . CARPAL TUNNEL RELEASE    . CATARACT EXTRACTION Bilateral   . CHOLECYSTECTOMY    . DILATION AND CURETTAGE OF UTERUS    . ENTEROCELE REPAIR     x2  . EYE SURGERY     retina  . hysterectomy - unknown type    . JOINT REPLACEMENT    . KNEE ARTHROPLASTY    . KNEE SURGERY     x6  . NECK SURGERY     fusion  . OOPHORECTOMY    . QUADRICEPS REPAIR Right   . RECTOCELE REPAIR     x2  . SHOULDER ARTHROSCOPY WITH SUBACROMIAL DECOMPRESSION, ROTATOR CUFF REPAIR AND BICEP TENDON REPAIR  10/06/2012   Procedure: SHOULDER ARTHROSCOPY WITH SUBACROMIAL DECOMPRESSION, ROTATOR CUFF REPAIR AND BICEP TENDON REPAIR;  Surgeon:  Nita Sells, MD;  Location: [facility];  Service: Orthopedics;  Laterality: Right;  Arthroscopic  Repair  of  Subscapularis, Open Biceps Tenodesis  . SHOULDER SURGERY     bilateral- bones spur  . TONSILLECTOMY    . TOTAL KNEE ARTHROPLASTY    . TOTAL SHOULDER ARTHROPLASTY        Social History:  reports that Mia [name] has never smoked. Mia [name] has never used smokeless tobacco. Mia [name] reports that Mia [name] does not drink alcohol and does not use drugs.  Family History: family history includes Asthma in her father, mother, sister, and sister; Breast cancer in her maternal grandmother; Colon polyps in her mother; Dementia in her sister; Diabetes in her mother; Heart attack in her father and mother; Heart disease in her father and mother; Hypertension in her sister; Lupus in her sister; Pancreatic cancer in her maternal grandfather; Rheum arthritis in her sister; Stroke in her mother and sister; Wilson's disease in her maternal  grandmother.   HOME MEDICATIONS: Allergies as of 05/21/2020      Reactions   Codeine Nausea And Vomiting   Myrbetriq  [mirabegron] Other (See Comments)   SEVERE HEADACHE   Percocet [oxycodone-acetaminophen] Nausea And Vomiting   Celebrex [celecoxib] Other (See Comments)   Other reaction(s): Other flushed   Darvocet [propoxyphene N-acetaminophen] Nausea And Vomiting   Erythromycin Nausea And Vomiting   Nausea and vomiting   Hydrocodone Nausea And Vomiting   Lyrica [pregabalin] Swelling   Legs swelling   Macrodantin [nitrofurantoin] Nausea And Vomiting   Toradol [ketorolac Tromethamine] Nausea And Vomiting   Per patient, only PO form causes nausea and vomiting. Can take injection without issue   Tramadol Nausea And Vomiting   Verapamil Nausea And Vomiting   REACTION: intolerance      Medication List       Accurate as of May 21, 2020  8:03 AM. If you have any questions, ask your nurse or doctor.        AMBULATORY NON FORMULARY MEDICATION Medication Name: One touch ultra strips. Check fasting blood sugar in the morning and as needed.  Dx - [phone] Fax to [phone]   atorvastatin 40 MG tablet Commonly known as: LIPITOR TAKE 1 TABLET EVERY DAY   colestipol 1 g tablet Commonly known as: COLESTID   denosumab 60 MG/ML Soln injection Commonly known as: PROLIA Inject 60 mg into the skin every 6 (six) months. Administer in upper arm, thigh, or abdomen   donepezil 10 MG tablet Commonly known as: ARICEPT TAKE 1 TABLET BY MOUTH AT BEDTIME   fluticasone 50 MCG/ACT nasal spray Commonly known as: FLONASE Place 2 sprays into both nostrils daily.   gabapentin 300 MG capsule Commonly known as: NEURONTIN TAKE 1 TO 2 CAPSULES BY MOUTH UP TO TWO TIMES DAILY AS NEEDED   hydrochlorothiazide 25 MG tablet Commonly known as: HYDRODIURIL TAKE 1 TABLET BY MOUTH EVERY DAY   leflunomide 20 MG tablet Commonly known as: ARAVA Take 1 tablet (20 mg total) by mouth daily.   losartan  50 MG tablet Commonly known as: COZAAR Take 1 tablet (50 mg total) by mouth daily. Take 1 tablet in the morning and 1 tablet in evening as needed for blood pressure greater than 150   Melatonin 10 MG Caps Take 10 capsules by mouth daily.   memantine 10 MG tablet Commonly known as: NAMENDA TAKE 1 TABLET TWICE DAILY   metFORMIN 500 MG 24 hr tablet Commonly known as: GLUCOPHAGE-XR Take 500 mg by mouth daily  with breakfast.   metoprolol tartrate 100 MG tablet Commonly known as: LOPRESSOR TAKE 1 AND 1/2 TABLETS TWICE DAILY   MiraLax 17 GM/SCOOP powder Generic drug: polyethylene glycol powder Take 17 g by mouth daily as needed for mild constipation or moderate constipation.   ondansetron 4 MG tablet Commonly known as: Zofran Take 1 tablet (4 mg total) by mouth daily as needed for nausea or vomiting.   OneTouch Ultra test strip Generic drug: glucose blood USE TO TEST FASTING BLOOD SUGAR IN THE MORNING AND AS NEEDED   pantoprazole 40 MG tablet Commonly known as: PROTONIX Take 40 mg by mouth 2 (two) times daily.   primidone 50 MG tablet Commonly known as: MYSOLINE TAKE 1 TABLET AT BEDTIME   venlafaxine XR 75 MG 24 hr capsule Commonly known as: EFFEXOR-XR TAKE 1 CAPSULE EVERY DAY WITH BREAKFAST   VITAMIN B-12 IJ Inject 1 mL as directed every 30 (thirty) days.         REVIEW OF SYSTEMS: A comprehensive ROS was conducted with the patient and is negative except as per HPI    OBJECTIVE:  VS: BP (!) 148/72 (BP Location: Left Arm, Patient Position: Sitting, Cuff Size: Normal)   Pulse 62   Ht 5\' 8"  (1.727 m)   Wt 188 lb (85.3 kg)   SpO2 97%   BMI 28.59 kg/m    Wt Readings from Last 3 Encounters:  05/21/20 188 lb (85.3 kg)  04/19/20 190 lb (86.2 kg)  04/19/20 187 lb 8 oz (85 kg)     EXAM: General: Pt appears well and is in NAD  Neck: General: Supple without adenopathy. Thyroid: Thyroid size normal.  No goiter or nodules appreciated. No thyroid bruit.  Lungs:  Clear with good BS bilat with no rales, rhonchi, or wheezes  Heart: Auscultation: RRR.  Abdomen: Normoactive bowel sounds, soft, nontender, without masses or organomegaly palpable  Extremities:  LE: Trace pretibial edema  Skin: Hair: Texture and amount normal with gender appropriate distribution Skin Inspection: No rashes Skin Palpation: Skin temperature, texture, and thickness normal to palpation  Neuro: Cranial nerves: II - XII grossly intact  DTRs: 2+ and symmetric in UE without delay in relaxation phase  Mental Status: Judgment, insight: Intact Orientation: Oriented to time, place, and person Mood and affect: No depression, anxiety, or agitation     DATA REVIEWED: Results for RAINAH, KIRSHNER (MRN [id number]) as of 05/22/2020 09:23  Ref. Range 05/21/2020 08:19  Sodium Latest Ref Range: 135 - 145 mEq/L 130 (L)  Potassium Latest Ref Range: 3.5 - 5.1 mEq/L 3.5  Chloride Latest Ref Range: 96 - 112 mEq/L 92 (L)  CO2 Latest Ref Range: 19 - 32 mEq/L 29  Glucose Latest Ref Range: 70 - 99 mg/dL 113 (H)  BUN Latest Ref Range: 6 - 23 mg/dL 12  Creatinine Latest Ref Range: 0.40 - 1.20 mg/dL 0.77  Calcium Latest Ref Range: 8.4 - 10.5 mg/dL 9.8  Alkaline Phosphatase Latest Ref Range: 39 - 117 U/L 73  Albumin Latest Ref Range: 3.5 - 5.2 g/dL 4.0  AST Latest Ref Range: 0 - 37 U/L 26  ALT Latest Ref Range: 0 - 35 U/L 25  Total Protein Latest Ref Range: 6.0 - 8.3 g/dL 6.4  Total Bilirubin Latest Ref Range: 0.2 - 1.2 mg/dL 0.4  GFR Latest Ref Range: >60.00 mL/min 74.39  TSH Latest Ref Range: 0.35 - 4.50 uIU/mL 1.34      ASSESSMENT/PLAN/RECOMMENDATIONS:   1. Hyponatremia :  - D/D include diuretic use,  SIADH, primary polydipsia, low effective arterial blood volume or a combination of the above.  - At this point Mia [name] drinks > 100 ounce of liquid daily  - Pt advised to half her liquid intake to no more than  - Calculated serum osm 270 mOsm/kg, awaiting urine osm. And urine sodium ( keeping in mind  this is with HCTZ on board) - I will go ahead and stop HCTZ at this time, and will recheck labs in next visit in 2 weeks  - We discussed the risk of hyponatremia in causing cerebral edema and neurological symptoms    Medications : Stop HCTZ for now     F/U in 2 weeks     Signed electronically by: Mack Guise, MD  [facility]  [address] [place], [place] [address] Phone: [phone] FAX: [phone]   CC: Hali Marry, [address] [address] [place] [address] Phone: [phone] Fax: [phone]   Return to Endocrinology clinic as below: Future Appointments  Date Time Provider [place]  05/22/2020 12:00 PM Evans Lance, MD [facility]  06/11/2020 10:50 AM Hali Marry, MD [facility] None  06/13/2020 10:00 AM PCK NURSE [facility] None  08/09/2020 10:20 AM [name], PA-C [facility] None  08/28/2020 10:30 AM Langbehn Givens, NP [facility] None  09/11/2020 10:30 AM PCK NURSE [facility] None

## 2020-05-22 ENCOUNTER — Ambulatory Visit (INDEPENDENT_AMBULATORY_CARE_PROVIDER_SITE_OTHER): Payer: Medicare Other | Admitting: Internal Medicine

## 2020-05-22 ENCOUNTER — Encounter: Payer: Self-pay | Admitting: Internal Medicine

## 2020-05-22 VITALS — BP 116/72 | HR 77 | Ht 66.0 in | Wt 187.0 lb

## 2020-05-22 DIAGNOSIS — I10 Essential (primary) hypertension: Secondary | ICD-10-CM | POA: Diagnosis not present

## 2020-05-22 DIAGNOSIS — I471 Supraventricular tachycardia, unspecified: Secondary | ICD-10-CM

## 2020-05-22 DIAGNOSIS — R000 Tachycardia, unspecified: Secondary | ICD-10-CM

## 2020-05-22 DIAGNOSIS — E871 Hypo-osmolality and hyponatremia: Secondary | ICD-10-CM | POA: Insufficient documentation

## 2020-05-22 NOTE — Progress Notes (Signed)
HPI Mia [name] is referred today by Dr. Acie Fredrickson for evaluation of SVT. Her history dates back almost 10 years when she had unexplained sob and underwent left heart cath. The day after the procedure when she was on tele she was found to be in SVT at 214/min. Unclear if she got IV adenosine as she is not the best historian. She did/does not have palpitations. She notes that she will suddenly get sob. She has not had syncope. She has been gradually uptitrated on her beta blocker such that she is now on 150 bid. She wore a cardiac monitor and had SVT at over 160/min. Though these episodes were brief. She does have dyspnea with exertion and it is unclear to me if she is going into SVT with exertion or whether or not she has sinus tachy and exertional dyspnea.  Allergies  Allergen Reactions  . Codeine Nausea And Vomiting  . Myrbetriq  [Mirabegron] Other (See Comments)    SEVERE HEADACHE  . Percocet [Oxycodone-Acetaminophen] Nausea And Vomiting  . Celebrex [Celecoxib] Other (See Comments)    Other reaction(s): Other flushed  . Darvocet [Propoxyphene N-Acetaminophen] Nausea And Vomiting  . Erythromycin Nausea And Vomiting    Nausea and vomiting   . Hydrocodone Nausea And Vomiting  . Lyrica [Pregabalin] Swelling    Legs swelling   . Macrodantin [Nitrofurantoin] Nausea And Vomiting  . Toradol [Ketorolac Tromethamine] Nausea And Vomiting    Per patient, only PO form causes nausea and vomiting. Can take injection without issue  . Tramadol Nausea And Vomiting  . Verapamil Nausea And Vomiting    REACTION: intolerance     Current Outpatient Medications  Medication Sig Dispense Refill  . AMBULATORY NON FORMULARY MEDICATION Medication Name: One touch ultra strips. Check fasting blood sugar in the morning and as needed.  Dx - [id number] Fax to [phone] 50 strip 11  . atorvastatin (LIPITOR) 40 MG tablet TAKE 1 TABLET EVERY DAY 90 tablet 3  . colestipol (COLESTID) 1 g tablet     .  Cyanocobalamin (VITAMIN B-12 IJ) Inject 1 mL as directed every 30 (thirty) days.     Marland Kitchen denosumab (PROLIA) 60 MG/ML SOLN injection Inject 60 mg into the skin every 6 (six) months. Administer in upper arm, thigh, or abdomen    . donepezil (ARICEPT) 10 MG tablet TAKE 1 TABLET BY MOUTH AT BEDTIME 90 tablet 3  . fluticasone (FLONASE) 50 MCG/ACT nasal spray Place 2 sprays into both nostrils daily. 16 g 6  . gabapentin (NEURONTIN) 300 MG capsule TAKE 1 TO 2 CAPSULES BY MOUTH UP TO TWO TIMES DAILY AS NEEDED 180 capsule 3  . leflunomide (ARAVA) 20 MG tablet Take 1 tablet (20 mg total) by mouth daily. 90 tablet 0  . losartan (COZAAR) 50 MG tablet Take 2 tablets (100 mg total) by mouth daily. Take 1 tablet in the morning and 1 tablet in evening as needed for blood pressure greater than 150 60 tablet 0  . Melatonin 10 MG CAPS Take 10 capsules by mouth daily.    . memantine (NAMENDA) 10 MG tablet TAKE 1 TABLET TWICE DAILY 180 tablet 4  . metFORMIN (GLUCOPHAGE-XR) 500 MG 24 hr tablet Take 500 mg by mouth daily with breakfast.    . metoprolol tartrate (LOPRESSOR) 100 MG tablet TAKE 1 AND 1/2 TABLETS TWICE DAILY 270 tablet 3  . ondansetron (ZOFRAN) 4 MG tablet Take 1 tablet (4 mg total) by mouth daily as needed for nausea or vomiting. [place]  tablet 1  . ondansetron (ZOFRAN-ODT) 8 MG disintegrating tablet Take 1 tablet by mouth as needed for nausea.    Glory Rosebush ULTRA test strip USE TO TEST FASTING BLOOD SUGAR IN THE MORNING AND AS NEEDED 50 strip 11  . pantoprazole (PROTONIX) 40 MG tablet Take 40 mg by mouth 2 (two) times daily.    . polyethylene glycol powder (MIRALAX) powder Take 17 g by mouth daily as needed for mild constipation or moderate constipation.     . primidone (MYSOLINE) 50 MG tablet TAKE 1 TABLET AT BEDTIME 90 tablet 1  . venlafaxine XR (EFFEXOR-XR) 75 MG 24 hr capsule TAKE 1 CAPSULE EVERY DAY WITH BREAKFAST 90 capsule 3   No current facility-administered medications for this visit.     Past  Medical History:  Diagnosis Date  . Anal fissure   . Anemia   . Arthritis   . Blood transfusion   . C. difficile colitis   . Chest pain    a. 01/2013 MV: EF 59%, no ischemia.  . Chronic Dyspnea    a. 01/2013 Echo: EF 60-65%, Gr 1 DD, PASP 67mmHg.// Echo 01/2020: EF 60-65, no RWMA, GR 1 DD, GLS -21.6%, normal RV SF, trivial MR   . COVID-19   . Diabetes mellitus   . Fibromyalgia   . Gall stones   . GERD (gastroesophageal reflux disease)    gastritis  . Hemorrhoids   . Hypertension   . Interstitial cystitis   . MCI (mild cognitive impairment) 11/25/2017  . Memory difficulty 12/14/2013  . Neuromuscular disorder ([place])    sclerosis  . Osteoporosis   . Peptic ulcer   . PONV (postoperative nausea and vomiting)   . Pseudogout   . Raynaud phenomenon   . Rectal bleeding   . Sleep apnea    a. on cpap.  Marland Kitchen SVT (supraventricular tachycardia) ([place])   . Syncope 09/10/2015  . Thyroid disease    hypothyroidism  . Tremor, essential 05/14/2016    ROS:   All systems reviewed and negative except as noted in the HPI.   Past Surgical History:  Procedure Laterality Date  . APPENDECTOMY    . BLADDER SURGERY     x2  . BLADDER SURGERY    . BREAST BIOPSY    . CARDIAC CATHETERIZATION    . CARPAL TUNNEL RELEASE    . CATARACT EXTRACTION Bilateral   . CHOLECYSTECTOMY    . DILATION AND CURETTAGE OF UTERUS    . ENTEROCELE REPAIR     x2  . EYE SURGERY     retina  . hysterectomy - unknown type    . JOINT REPLACEMENT    . KNEE ARTHROPLASTY    . KNEE SURGERY     x6  . NECK SURGERY     fusion  . OOPHORECTOMY    . QUADRICEPS REPAIR Right   . RECTOCELE REPAIR     x2  . SHOULDER ARTHROSCOPY WITH SUBACROMIAL DECOMPRESSION, ROTATOR CUFF REPAIR AND BICEP TENDON REPAIR  10/06/2012   Procedure: SHOULDER ARTHROSCOPY WITH SUBACROMIAL DECOMPRESSION, ROTATOR CUFF REPAIR AND BICEP TENDON REPAIR;  Surgeon: Nita Sells, MD;  Location: [place];  Service: Orthopedics;   Laterality: Right;  Arthroscopic  Repair  of  Subscapularis, Open Biceps Tenodesis  . SHOULDER SURGERY     bilateral- bones spur  . TONSILLECTOMY    . TOTAL KNEE ARTHROPLASTY    . TOTAL SHOULDER ARTHROPLASTY       Family History  Problem Relation Age  of Onset  . Heart attack Mother   . Stroke Mother   . Diabetes Mother   . Heart disease Mother   . Colon polyps Mother   . Asthma Mother   . Breast cancer Maternal Grandmother   . Wilson's disease Maternal Grandmother   . Pancreatic cancer Maternal Grandfather   . Heart disease Father   . Heart attack Father   . Asthma Father   . Stroke Sister   . Hypertension Sister   . Rheum arthritis Sister   . Dementia Sister   . Asthma Sister   . Lupus Sister   . Asthma Sister      Social History   Socioeconomic History  . Marital status: Married    Spouse name: Not on file  . Number of children: 2  . Years of education: 75  . Highest education level: Not on file  Occupational History  . Occupation: Retired  Tobacco Use  . Smoking status: Never Smoker  . Smokeless tobacco: Never Used  Vaping Use  . Vaping Use: Never used  Substance and Sexual Activity  . Alcohol use: No    Alcohol/week: 0.0 standard drinks  . Drug use: No  . Sexual activity: Not on file  Other Topics Concern  . Not on file  Social History Narrative   Patient drinks about 3-4 cups of caffeine daily.   Patient is right handed.   Social Determinants of Health   Financial Resource Strain:   . Difficulty of Paying Living Expenses:   Food Insecurity:   . Worried About Charity fundraiser in the Last Year:   . Arboriculturist in the Last Year:   Transportation Needs:   . Film/video editor (Medical):   Marland Kitchen Lack of Transportation (Non-Medical):   Physical Activity:   . Days of Exercise per Week:   . Minutes of Exercise per Session:   Stress:   . Feeling of Stress :   Social Connections:   . Frequency of Communication with Friends and Family:   .  Frequency of Social Gatherings with Friends and Family:   . Attends Religious Services:   . Active Member of Clubs or Organizations:   . Attends Archivist Meetings:   Marland Kitchen Marital Status:   Intimate Partner Violence:   . Fear of Current or Ex-Partner:   . Emotionally Abused:   Marland Kitchen Physically Abused:   . Sexually Abused:      BP 116/72   Pulse 77   Ht 5\' 6"  (1.676 m)   Wt 187 lb (84.8 kg)   SpO2 90%   BMI 30.18 kg/m   Physical Exam:  Well appearing NAD HEENT: Unremarkable Neck:  No JVD, no thyromegally Lymphatics:  No adenopathy Back:  No CVA tenderness Lungs:  Clear with no wheezes HEART:  Regular rate rhythm, no murmurs, no rubs, no clicks Abd:  soft, positive bowel sounds, no organomegally, no rebound, no guarding Ext:  2 plus pulses, no edema, no cyanosis, no clubbing Skin:  No rashes no nodules Neuro:  CN II through XII intact, motor grossly intact  EKG - nsr with no ventricular pre-excitation.   Assess/Plan: 1. SVT - I have discussed the treatment options with the patient. It is a little bit difficult to know exactly how much SVT she is having because she does not have palpitations. I discussed the risks/benefits/goals/expectations of catheter ablation. She will consider this. 2. Sob - she has exertional dyspnea. Unclear if she is going  into SVT. I think we should have her undergo exercise testing to better understand this.  Mikle Bosworth.D.

## 2020-05-22 NOTE — Patient Instructions (Addendum)
Medication Instructions:  Your physician recommends that you continue on your current medications as directed. Please refer to the Current Medication list given to you today.  *If you need a refill on your cardiac medications before your next appointment, please call your pharmacy*  Lab Work: None ordered.  If you have labs (blood work) drawn today and your tests are completely normal, you will receive your results only by: Marland Kitchen MyChart Message (if you have MyChart) OR . A paper copy in the mail If you have any lab test that is abnormal or we need to change your treatment, we will call you to review the results.  Testing/Procedures:  Please schedule for an exercise treadmill test.  Follow-Up:  Your next appointment:   Your physician wants you to follow-up in: based on results of treadmill test.

## 2020-05-23 ENCOUNTER — Other Ambulatory Visit: Payer: Self-pay | Admitting: *Deleted

## 2020-05-23 DIAGNOSIS — M48062 Spinal stenosis, lumbar region with neurogenic claudication: Secondary | ICD-10-CM | POA: Diagnosis not present

## 2020-05-23 LAB — OSMOLALITY: Osmolality: 271 mOsm/kg — ABNORMAL LOW (ref 278–305)

## 2020-05-23 LAB — OSMOLALITY, URINE: Osmolality, Ur: 209 mOsm/kg (ref 50–1200)

## 2020-05-23 LAB — SODIUM, URINE, RANDOM: Sodium, Ur: <10 mmol/L — ABNORMAL LOW (ref 28–272)

## 2020-05-23 MED ORDER — LEFLUNOMIDE 20 MG PO TABS: 20.0000 mg | ORAL_TABLET | Freq: Every day | ORAL | 0 refills | Status: DC

## 2020-05-23 NOTE — Telephone Encounter (Signed)
Refill request received via fax  Last Visit: 03/08/2020 Next Visit: 08/09/2020 Labs: 03/08/2020 Anemia is stable. Sodium and chloride borderline low. Glucose is elevated-139.   Current Dose per office note 03/08/2020:  Arava 20 mg 1 tablet by mouth daily DX:  Seronegative rheumatoid arthritis  Okay to refill Arava?

## 2020-05-31 DIAGNOSIS — M545 Low back pain: Secondary | ICD-10-CM | POA: Diagnosis not present

## 2020-05-31 DIAGNOSIS — M542 Cervicalgia: Secondary | ICD-10-CM | POA: Diagnosis not present

## 2020-06-04 ENCOUNTER — Other Ambulatory Visit: Payer: Self-pay

## 2020-06-04 ENCOUNTER — Ambulatory Visit (INDEPENDENT_AMBULATORY_CARE_PROVIDER_SITE_OTHER): Payer: Medicare Other

## 2020-06-04 DIAGNOSIS — I471 Supraventricular tachycardia, unspecified: Secondary | ICD-10-CM

## 2020-06-04 DIAGNOSIS — M542 Cervicalgia: Secondary | ICD-10-CM | POA: Diagnosis not present

## 2020-06-04 DIAGNOSIS — M545 Low back pain: Secondary | ICD-10-CM | POA: Diagnosis not present

## 2020-06-04 LAB — EXERCISE TOLERANCE TEST
Estimated workload: 4.6 METS
Exercise duration (min): 2 min
Exercise duration (sec): 31 s
MPHR: 152 {beats}/min
Peak HR: 121 {beats}/min
Percent HR: 79 %
RPE: 19
Rest HR: 58 {beats}/min

## 2020-06-06 ENCOUNTER — Telehealth: Payer: Self-pay

## 2020-06-06 NOTE — Telephone Encounter (Signed)
Call placed to Pt.  Advised of results of treadmill test.  F/u scheduled.

## 2020-06-06 NOTE — Telephone Encounter (Signed)
-----   Message from Evans Lance, MD sent at 06/04/2020 10:08 PM EDT ----- No SVT during exercise. She can followup with me to discuss where to go next in the next 3-4 weeks.

## 2020-06-10 ENCOUNTER — Other Ambulatory Visit: Payer: Self-pay

## 2020-06-10 DIAGNOSIS — Z79899 Other long term (current) drug therapy: Secondary | ICD-10-CM | POA: Diagnosis not present

## 2020-06-11 ENCOUNTER — Other Ambulatory Visit: Payer: Self-pay

## 2020-06-11 ENCOUNTER — Ambulatory Visit (INDEPENDENT_AMBULATORY_CARE_PROVIDER_SITE_OTHER): Payer: Medicare Other | Admitting: Internal Medicine

## 2020-06-11 ENCOUNTER — Encounter: Payer: Self-pay | Admitting: Family Medicine

## 2020-06-11 ENCOUNTER — Ambulatory Visit (INDEPENDENT_AMBULATORY_CARE_PROVIDER_SITE_OTHER): Payer: Medicare Other | Admitting: Family Medicine

## 2020-06-11 ENCOUNTER — Encounter: Payer: Self-pay | Admitting: Internal Medicine

## 2020-06-11 VITALS — BP 134/80 | HR 64 | Ht 66.0 in | Wt 183.2 lb

## 2020-06-11 VITALS — BP 153/68 | HR 56 | Ht 66.0 in | Wt 183.0 lb

## 2020-06-11 DIAGNOSIS — R631 Polydipsia: Secondary | ICD-10-CM | POA: Diagnosis not present

## 2020-06-11 DIAGNOSIS — R29898 Other symptoms and signs involving the musculoskeletal system: Secondary | ICD-10-CM

## 2020-06-11 DIAGNOSIS — R10822 Left upper quadrant rebound abdominal tenderness: Secondary | ICD-10-CM

## 2020-06-11 DIAGNOSIS — F54 Psychological and behavioral factors associated with disorders or diseases classified elsewhere: Secondary | ICD-10-CM

## 2020-06-11 DIAGNOSIS — E538 Deficiency of other specified B group vitamins: Secondary | ICD-10-CM | POA: Diagnosis not present

## 2020-06-11 DIAGNOSIS — Z8619 Personal history of other infectious and parasitic diseases: Secondary | ICD-10-CM

## 2020-06-11 DIAGNOSIS — R7303 Prediabetes: Secondary | ICD-10-CM

## 2020-06-11 DIAGNOSIS — D510 Vitamin B12 deficiency anemia due to intrinsic factor deficiency: Secondary | ICD-10-CM

## 2020-06-11 DIAGNOSIS — M1711 Unilateral primary osteoarthritis, right knee: Secondary | ICD-10-CM

## 2020-06-11 DIAGNOSIS — R7301 Impaired fasting glucose: Secondary | ICD-10-CM | POA: Diagnosis not present

## 2020-06-11 DIAGNOSIS — E232 Diabetes insipidus: Secondary | ICD-10-CM

## 2020-06-11 LAB — CBC WITH DIFFERENTIAL/PLATELET
Absolute Monocytes: 410 cells/uL (ref 200–950)
Basophils Absolute: 50 cells/uL (ref 0–200)
Basophils Relative: 1 %
Eosinophils Absolute: 100 cells/uL (ref 15–500)
Eosinophils Relative: 2 %
HCT: 36.9 % (ref 35.0–45.0)
Hemoglobin: 11.6 g/dL — ABNORMAL LOW (ref 11.7–15.5)
Lymphs Abs: 1465 cells/uL (ref 850–3900)
MCH: 27.6 pg (ref 27.0–33.0)
MCHC: 31.4 g/dL — ABNORMAL LOW (ref 32.0–36.0)
MCV: 87.6 fL (ref 80.0–100.0)
MPV: 11.2 fL (ref 7.5–12.5)
Monocytes Relative: 8.2 %
Neutro Abs: 2975 cells/uL (ref 1500–7800)
Neutrophils Relative %: 59.5 %
Platelets: 384 10*3/uL (ref 140–400)
RBC: 4.21 10*6/uL (ref 3.80–5.10)
RDW: 15.1 % — ABNORMAL HIGH (ref 11.0–15.0)
Total Lymphocyte: 29.3 %
WBC: 5 10*3/uL (ref 3.8–10.8)

## 2020-06-11 LAB — COMPLETE METABOLIC PANEL WITH GFR
AG Ratio: 1.7 (calc) (ref 1.0–2.5)
ALT: 27 U/L (ref 6–29)
AST: 31 U/L (ref 10–35)
Albumin: 4 g/dL (ref 3.6–5.1)
Alkaline phosphatase (APISO): 72 U/L (ref 37–153)
BUN: 8 mg/dL (ref 7–25)
CO2: 28 mmol/L (ref 20–32)
Calcium: 9.8 mg/dL (ref 8.6–10.4)
Chloride: 102 mmol/L (ref 98–110)
Creat: 0.84 mg/dL (ref 0.50–0.99)
GFR, Est African American: 83 mL/min/{1.73_m2} (ref >=60)
GFR, Est Non African American: 71 mL/min/{1.73_m2} (ref >=60)
Globulin: 2.4 g/dL (calc) (ref 1.9–3.7)
Glucose, Bld: 130 mg/dL — ABNORMAL HIGH (ref 65–99)
Potassium: 4.2 mmol/L (ref 3.5–5.3)
Sodium: 137 mmol/L (ref 135–146)
Total Bilirubin: 0.4 mg/dL (ref 0.2–1.2)
Total Protein: 6.4 g/dL (ref 6.1–8.1)

## 2020-06-11 LAB — POCT GLYCOSYLATED HEMOGLOBIN (HGB A1C): Hemoglobin A1C: 6 % — AB (ref 4.0–5.6)

## 2020-06-11 MED ORDER — CYANOCOBALAMIN 1000 MCG/ML IJ SOLN
1000.0000 ug | Freq: Once | INTRAMUSCULAR | Status: AC
  Administered 2020-06-11: 1000 ug via INTRAMUSCULAR

## 2020-06-11 MED ORDER — BLOOD GLUCOSE METER KIT: PACK | 0 refills | Status: DC

## 2020-06-11 NOTE — Progress Notes (Signed)
Established Patient Office Visit  Subjective:  Patient ID: Mia [name], female    DOB: May 03, 1951  Age: 69 y.o. MRN: [id number]  CC:  Chief Complaint  Patient presents with  . Hypertension    HPI Mia [name] presents for 33-monthfollow-up.  Hypertension- Pt denies chest pain, SOB, dizziness, or heart palpitations.  Taking meds as directed w/o problems.  Denies medication side effects.    Impaired fasting glucose-no increased thirst or urination. No symptoms consistent with hypoglycemia.  He did consult with Dr. SKelton Pillarwith endocrinology for her hyponatremia.  They are putting her on fluid restriction and stopping her HCTZ currently.  She says she has really cut back significantly on her diet soda and fluid intake.  In fact she just had a metabolic panel done yesterday.  Her sodium was back to normal.  Liver enzymes were normal as well.  Hemoglobin borderline low at 11.6.  This was actually improved from previous of 3 months ago when it was 11.0.  B12 anemia-due for B12 injection.  She also reports that since last Thursday she is actually been nauseated.  She had more episodic nausea it occurred on Thursday and then again on Sunday.  She did vomit a couple of times she felt sweaty.  And also experienced some watery diarrhea.  No fever that she is aware of or chills.  No blood in the stool.  She said she did feel a little lightheaded around that time as well.  Also had a little pain in the left upper quadrant as well.  Also notes that the physical therapist has asked about having a further work-up for her lower extremity weakness.  In doing her therapy they have noted that her feet and ankles are actually pretty strong but her upper legs are actually quite weak.  They wondered if it could be related to her recent Covid infection or if it may be something else going on.  Past Medical History:  Diagnosis Date  . Anal fissure   . Anemia   . Arthritis   . Blood transfusion   . C.  difficile colitis   . Chest pain    a. 01/2013 MV: EF 59%, no ischemia.  . Chronic Dyspnea    a. 01/2013 Echo: EF 60-65%, Gr 1 DD, PASP 222mg.// Echo 01/2020: EF 60-65, no RWMA, GR 1 DD, GLS -21.6%, normal RV SF, trivial MR   . COVID-19   . Diabetes mellitus   . Fibromyalgia   . Gall stones   . GERD (gastroesophageal reflux disease)    gastritis  . Hemorrhoids   . Hypertension   . Interstitial cystitis   . MCI (mild cognitive impairment) 11/25/2017  . Memory difficulty 12/14/2013  . Neuromuscular disorder (H[place]   sclerosis  . Osteoporosis   . Peptic ulcer   . PONV (postoperative nausea and vomiting)   . Pseudogout   . Raynaud phenomenon   . Rectal bleeding   . Sleep apnea    a. on cpap.  . Marland KitchenVT (supraventricular tachycardia) (HCSublette  . Syncope 09/10/2015  . Thyroid disease    hypothyroidism  . Tremor, essential 05/14/2016    Past Surgical History:  Procedure Laterality Date  . APPENDECTOMY    . BLADDER SURGERY     x2  . BLADDER SURGERY    . BREAST BIOPSY    . CARDIAC CATHETERIZATION    . CARPAL TUNNEL RELEASE    . CATARACT EXTRACTION Bilateral   . CHOLECYSTECTOMY    .  DILATION AND CURETTAGE OF UTERUS    . ENTEROCELE REPAIR     x2  . EYE SURGERY     retina  . hysterectomy - unknown type    . JOINT REPLACEMENT    . KNEE ARTHROPLASTY    . KNEE SURGERY     x6  . NECK SURGERY     fusion  . OOPHORECTOMY    . QUADRICEPS REPAIR Right   . RECTOCELE REPAIR     x2  . SHOULDER ARTHROSCOPY WITH SUBACROMIAL DECOMPRESSION, ROTATOR CUFF REPAIR AND BICEP TENDON REPAIR  10/06/2012   Procedure: SHOULDER ARTHROSCOPY WITH SUBACROMIAL DECOMPRESSION, ROTATOR CUFF REPAIR AND BICEP TENDON REPAIR;  Surgeon: Nita Sells, MD;  Location: [facility];  Service: Orthopedics;  Laterality: Right;  Arthroscopic  Repair  of  Subscapularis, Open Biceps Tenodesis  . SHOULDER SURGERY     bilateral- bones spur  . TONSILLECTOMY    . TOTAL KNEE ARTHROPLASTY    . TOTAL  SHOULDER ARTHROPLASTY      Family History  Problem Relation Age of Onset  . Heart attack Mother   . Stroke Mother   . Diabetes Mother   . Heart disease Mother   . Colon polyps Mother   . Asthma Mother   . Breast cancer Maternal Grandmother   . Wilson's disease Maternal Grandmother   . Pancreatic cancer Maternal Grandfather   . Heart disease Father   . Heart attack Father   . Asthma Father   . Stroke Sister   . Hypertension Sister   . Rheum arthritis Sister   . Dementia Sister   . Asthma Sister   . Lupus Sister   . Asthma Sister     Social History   Socioeconomic History  . Marital status: Married    Spouse name: Not on file  . Number of children: 2  . Years of education: 28  . Highest education level: Not on file  Occupational History  . Occupation: Retired  Tobacco Use  . Smoking status: Never Smoker  . Smokeless tobacco: Never Used  Vaping Use  . Vaping Use: Never used  Substance and Sexual Activity  . Alcohol use: No    Alcohol/week: 0.0 standard drinks  . Drug use: No  . Sexual activity: Not on file  Other Topics Concern  . Not on file  Social History Narrative   Patient drinks about 3-4 cups of caffeine daily.   Patient is right handed.   Social Determinants of Health   Financial Resource Strain:   . Difficulty of Paying Living Expenses: Not on file  Food Insecurity:   . Worried About Charity fundraiser in the Last Year: Not on file  . Ran Out of Food in the Last Year: Not on file  Transportation Needs:   . Lack of Transportation (Medical): Not on file  . Lack of Transportation (Non-Medical): Not on file  Physical Activity:   . Days of Exercise per Week: Not on file  . Minutes of Exercise per Session: Not on file  Stress:   . Feeling of Stress : Not on file  Social Connections:   . Frequency of Communication with Friends and Family: Not on file  . Frequency of Social Gatherings with Friends and Family: Not on file  . Attends Religious  Services: Not on file  . Active Member of Clubs or Organizations: Not on file  . Attends Archivist Meetings: Not on file  . Marital Status: Not on  file  Intimate Partner Violence:   . Fear of Current or Ex-Partner: Not on file  . Emotionally Abused: Not on file  . Physically Abused: Not on file  . Sexually Abused: Not on file    Outpatient Medications Prior to Visit  Medication Sig Dispense Refill  . atorvastatin (LIPITOR) 40 MG tablet TAKE 1 TABLET EVERY DAY 90 tablet 3  . colestipol (COLESTID) 1 g tablet     . Cyanocobalamin (VITAMIN B-12 IJ) Inject 1 mL as directed every 30 (thirty) days.     Marland Kitchen denosumab (PROLIA) 60 MG/ML SOLN injection Inject 60 mg into the skin every 6 (six) months. Administer in upper arm, thigh, or abdomen    . donepezil (ARICEPT) 10 MG tablet TAKE 1 TABLET BY MOUTH AT BEDTIME 90 tablet 3  . fluticasone (FLONASE) 50 MCG/ACT nasal spray Place 2 sprays into both nostrils daily. 16 g 6  . gabapentin (NEURONTIN) 300 MG capsule TAKE 1 TO 2 CAPSULES BY MOUTH UP TO TWO TIMES DAILY AS NEEDED 180 capsule 3  . leflunomide (ARAVA) 20 MG tablet Take 1 tablet (20 mg total) by mouth daily. 30 tablet 0  . losartan (COZAAR) 50 MG tablet Take 2 tablets (100 mg total) by mouth daily. Take 1 tablet in the morning and 1 tablet in evening as needed for blood pressure greater than 150 60 tablet 0  . Melatonin 10 MG CAPS Take 10 capsules by mouth daily.    . memantine (NAMENDA) 10 MG tablet TAKE 1 TABLET TWICE DAILY 180 tablet 4  . metFORMIN (GLUCOPHAGE-XR) 500 MG 24 hr tablet Take 500 mg by mouth daily with breakfast.    . metoprolol tartrate (LOPRESSOR) 100 MG tablet TAKE 1 AND 1/2 TABLETS TWICE DAILY 270 tablet 3  . ondansetron (ZOFRAN) 4 MG tablet Take 1 tablet (4 mg total) by mouth daily as needed for nausea or vomiting. 30 tablet 1  . ondansetron (ZOFRAN-ODT) 8 MG disintegrating tablet Take 1 tablet by mouth as needed for nausea.    . pantoprazole (PROTONIX) 40 MG tablet  Take 40 mg by mouth 2 (two) times daily.    . polyethylene glycol powder (MIRALAX) powder Take 17 g by mouth daily as needed for mild constipation or moderate constipation.     . primidone (MYSOLINE) 50 MG tablet TAKE 1 TABLET AT BEDTIME 90 tablet 1  . venlafaxine XR (EFFEXOR-XR) 75 MG 24 hr capsule TAKE 1 CAPSULE EVERY DAY WITH BREAKFAST 90 capsule 3  . AMBULATORY NON FORMULARY MEDICATION Medication Name: One touch ultra strips. Check fasting blood sugar in the morning and as needed.  Dx - G1829 Fax to [phone] 50 strip 11  . ONETOUCH ULTRA test strip USE TO TEST FASTING BLOOD SUGAR IN THE MORNING AND AS NEEDED 50 strip 11   No facility-administered medications prior to visit.    Allergies  Allergen Reactions  . Codeine Nausea And Vomiting  . Myrbetriq  [Mirabegron] Other (See Comments)    SEVERE HEADACHE  . Percocet [Oxycodone-Acetaminophen] Nausea And Vomiting  . Celebrex [Celecoxib] Other (See Comments)    Other reaction(s): Other flushed  . Darvocet [Propoxyphene N-Acetaminophen] Nausea And Vomiting  . Erythromycin Nausea And Vomiting    Nausea and vomiting   . Hydrocodone Nausea And Vomiting  . Lyrica [Pregabalin] Swelling    Legs swelling   . Macrodantin [Nitrofurantoin] Nausea And Vomiting  . Toradol [Ketorolac Tromethamine] Nausea And Vomiting    Per patient, only PO form causes nausea and vomiting. Can take injection  without issue  . Tramadol Nausea And Vomiting  . Verapamil Nausea And Vomiting    REACTION: intolerance    ROS Review of Systems    Objective:    Physical Exam Constitutional:      Appearance: She is well-developed.  HENT:     Head: Normocephalic and atraumatic.  Cardiovascular:     Rate and Rhythm: Normal rate and regular rhythm.     Heart sounds: Normal heart sounds.  Pulmonary:     Effort: Pulmonary effort is normal.     Breath sounds: Normal breath sounds.  Abdominal:     General: Abdomen is flat. Bowel sounds are normal. There is  no distension.     Palpations: Abdomen is soft.     Tenderness: There is no abdominal tenderness. There is no guarding.  Skin:    General: Skin is warm and dry.  Neurological:     Mental Status: She is alert and oriented to person, place, and time.  Psychiatric:        Behavior: Behavior normal.     BP (!) 153/68   Pulse (!) 56   Ht _0  (1.676 m)   Wt 183 lb (83 kg)   SpO2 98%   BMI 29.54 kg/m  Wt Readings from Last 3 Encounters:  06/11/20 183 lb (83 kg)  06/11/20 183 lb 3.2 oz (83.1 kg)  05/22/20 187 lb (84.8 kg)     Health Maintenance Due  Topic Date Due  . FOOT EXAM  11/15/2019  . OPHTHALMOLOGY EXAM  04/10/2020  . INFLUENZA VACCINE  05/26/2020    There are no preventive care reminders to display for this patient.  Lab Results  Component Value Date   TSH 1.34 05/21/2020   Lab Results  Component Value Date   WBC 5.0 06/10/2020   HGB 11.6 (L) 06/10/2020   HCT 36.9 06/10/2020   MCV 87.6 06/10/2020   PLT 384 06/10/2020   Lab Results  Component Value Date   NA 137 06/10/2020   K 4.2 06/10/2020   CO2 28 06/10/2020   GLUCOSE 130 (H) 06/10/2020   BUN 8 06/10/2020   CREATININE 0.84 06/10/2020   BILITOT 0.4 06/10/2020   ALKPHOS 73 05/21/2020   AST 31 06/10/2020   ALT 27 06/10/2020   PROT 6.4 06/10/2020   ALBUMIN 4.0 05/21/2020   CALCIUM 9.8 06/10/2020   ANIONGAP 12 02/22/2020   GFR 74.39 05/21/2020   Lab Results  Component Value Date   CHOL 128 06/07/2019   Lab Results  Component Value Date   HDL 52 06/07/2019   Lab Results  Component Value Date   LDLCALC 51 06/07/2019   Lab Results  Component Value Date   TRIG 97 11/07/2019   Lab Results  Component Value Date   CHOLHDL 2.5 06/07/2019   Lab Results  Component Value Date   HGBA1C 6.0 (A) 06/11/2020      Assessment & Plan:   Problem List Items Addressed This Visit      Endocrine   IFG (impaired fasting glucose)    A1C improved to 6.0 this time. Continue to work on healthy  choices and reducing sugar and carb intake. F/U  In 6 mo       Relevant Orders   POCT glycosylated hemoglobin (Hb A1C) (Completed)     Nervous and Auditory   Weakness of both lower extremities    Unclear etiology at this point.  Contender could be related to her statin we discussed holding her statin  for 3 to 4 weeks to see if she notices a difference if not then please restart the medication and let me know and we can do additional work - up.  She does have significant atrophy of some of the muscles of her right thigh particularly her quadriceps.  So could consider nerve conduction study and or getting her in with the orthopedist.        Musculoskeletal and Integument   Primary osteoarthritis of right knee    Unclear etiology.  Will start by holding statin for 3-4 weeks and see if sxs improve.          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      B12 deficiency    Given B12 injection today. Schedule f/u       Other Visit Diagnoses    Pernicious anemia    -  Primary   Relevant Medications   cyanocobalamin ((VITAMIN B-12)) injection 1,000 mcg (Completed)   Left upper quadrant abdominal tenderness with rebound tenderness       Relevant Orders   Lipase (Completed)     Left liver quadrant pain with nausea and diarrhea-we will do labs including ruling out pancreatitis we will check a CBC as well.  She is feeling a little bit better today.  She wonders if this could be Covid.  That is a possibility.  She had somewhat similar GI symptoms when she was diagnosed the first time.  Meds ordered this encounter  Medications  . cyanocobalamin ((VITAMIN B-12)) injection 1,000 mcg  . blood glucose meter kit and supplies    Sig: Dispense based on patient  and insurance.  Check fasting blood sugar in the morning and as needed DX: R73.03    Dispense:  1 each    Refill:  0    Order Specific Question:   Number of strips    Answer:   100    Order Specific Question:   Number of lancets    Answer:   100    Follow-up: Return in about 3 months (around 09/11/2020).   Time spent 45 minutes in encounter.  [name], MD

## 2020-06-11 NOTE — Progress Notes (Signed)
Name: Mia [name]  MRN/ DOB: [id number], May 09, 1951    Age/ Sex: 69 y.o., female     PCP: Hali Marry, MD   Reason for Endocrinology Evaluation: Hyponatremia      Initial Endocrinology Clinic Visit: 05/21/2020    PATIENT IDENTIFIER: Mia [name] is a 69 y.o., female with a past medical history of emphysema, OSA on CPAP , SVT and Raynaud's phenomenon, RA ,and memory loss . She has followed with [facility] since 05/21/2020 for consultative assistance with management of her Hyponatremia    HISTORICAL SUMMARY:  Pt has been noted with hyponatremia in 02/2020 with a nadir of 129 mmol/L.   She was consuming ~ 60-80 oz of crushed ice as well as ~ 40 oz of fluid a day. She was on HCTZ at the time, which we stopped.    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     SUBJECTIVE:    Today (06/12/2020):  Mia [name] is here for a follow up on hyponatremia. She is accompanied by her husband today.    On her last visit she endorsed approximately  160 oz of liquid intake a day, per pt she has been limiting liquid/ice intake.   She has been c/o nausea triggered by eating or drinking, colestipol has helped initially . She also has been tired.  Thursday started with nausea again after drinking something, has not been able of drink coffee.     Continues with eating  crushed ice  ~42 ounce but stopped adding water to it.  She has been drinking gatorade and little water here and there.   Stopped HCTZ, denies swelling of the legs,  Has noted with weight loss.      ROS:  As per HPI.   HISTORY:  Past Medical History:  Past Medical History:  Diagnosis Date  . Anal fissure   . Anemia   . Arthritis   . Blood transfusion   . C. difficile colitis   . Chest pain    a. 01/2013 MV: EF 59%, no ischemia.  . Chronic Dyspnea    a. 01/2013 Echo: EF 60-65%, Gr 1 DD, PASP 60mHg.// Echo 01/2020: EF 60-65, no RWMA, GR 1 DD,  GLS -21.6%, normal RV SF, trivial MR   . COVID-19   . Diabetes mellitus   . Fibromyalgia   . Gall stones   . GERD (gastroesophageal reflux disease)    gastritis  . Hemorrhoids   . Hypertension   . Interstitial cystitis   . MCI (mild cognitive impairment) 11/25/2017  . Memory difficulty 12/14/2013  . Neuromuscular disorder ([place]    sclerosis  . Osteoporosis   . Peptic ulcer   . PONV (postoperative nausea and vomiting)   . Pseudogout   . Raynaud phenomenon   . Rectal bleeding   . Sleep apnea    a. on cpap.  .Marland KitchenSVT (supraventricular tachycardia) ([place]   . Syncope 09/10/2015  . Thyroid disease    hypothyroidism  . Tremor, essential 05/14/2016   Past Surgical History:  Past Surgical History:  Procedure Laterality Date  . APPENDECTOMY    . BLADDER SURGERY     x2  . BLADDER SURGERY    . BREAST BIOPSY    . CARDIAC CATHETERIZATION    . CARPAL TUNNEL RELEASE    . CATARACT EXTRACTION Bilateral   . CHOLECYSTECTOMY    . DILATION AND CURETTAGE OF UTERUS    .  ENTEROCELE REPAIR     x2  . EYE SURGERY     retina  . hysterectomy - unknown type    . JOINT REPLACEMENT    . KNEE ARTHROPLASTY    . KNEE SURGERY     x6  . NECK SURGERY     fusion  . OOPHORECTOMY    . QUADRICEPS REPAIR Right   . RECTOCELE REPAIR     x2  . SHOULDER ARTHROSCOPY WITH SUBACROMIAL DECOMPRESSION, ROTATOR CUFF REPAIR AND BICEP TENDON REPAIR  10/06/2012   Procedure: SHOULDER ARTHROSCOPY WITH SUBACROMIAL DECOMPRESSION, ROTATOR CUFF REPAIR AND BICEP TENDON REPAIR;  Surgeon: Nita Sells, MD;  Location: [facility];  Service: Orthopedics;  Laterality: Right;  Arthroscopic  Repair  of  Subscapularis, Open Biceps Tenodesis  . SHOULDER SURGERY     bilateral- bones spur  . TONSILLECTOMY    . TOTAL KNEE ARTHROPLASTY    . TOTAL SHOULDER ARTHROPLASTY      Social History:  reports that she has never smoked. She has never used smokeless tobacco. She reports that she does not drink alcohol and  does not use drugs. Family History:  Family History  Problem Relation Age of Onset  . Heart attack Mother   . Stroke Mother   . Diabetes Mother   . Heart disease Mother   . Colon polyps Mother   . Asthma Mother   . Breast cancer Maternal Grandmother   . Wilson's disease Maternal Grandmother   . Pancreatic cancer Maternal Grandfather   . Heart disease Father   . Heart attack Father   . Asthma Father   . Stroke Sister   . Hypertension Sister   . Rheum arthritis Sister   . Dementia Sister   . Asthma Sister   . Lupus Sister   . Asthma Sister      HOME MEDICATIONS: Allergies as of 06/11/2020      Reactions   Codeine Nausea And Vomiting   Myrbetriq  [mirabegron] Other (See Comments)   SEVERE HEADACHE   Percocet [oxycodone-acetaminophen] Nausea And Vomiting   Celebrex [celecoxib] Other (See Comments)   Other reaction(s): Other flushed   Darvocet [propoxyphene N-acetaminophen] Nausea And Vomiting   Erythromycin Nausea And Vomiting   Nausea and vomiting   Hydrocodone Nausea And Vomiting   Lyrica [pregabalin] Swelling   Legs swelling   Macrodantin [nitrofurantoin] Nausea And Vomiting   Toradol [ketorolac Tromethamine] Nausea And Vomiting   Per patient, only PO form causes nausea and vomiting. Can take injection without issue   Tramadol Nausea And Vomiting   Verapamil Nausea And Vomiting   REACTION: intolerance      Medication List       Accurate as of June 11, 2020 11:59 PM. If you have any questions, ask your nurse or doctor.        STOP taking these medications   AMBULATORY NON FORMULARY MEDICATION Stopped by: [name], MD   OneTouch Ultra test strip Generic drug: glucose blood Stopped by: [name], MD     TAKE these medications   atorvastatin 40 MG tablet Commonly known as: LIPITOR TAKE 1 TABLET EVERY DAY   blood glucose meter kit and supplies Dispense based on patient and insurance.  Check fasting blood sugar in the morning and  as needed DX: R73.03 Started by: [name], MD   colestipol 1 g tablet Commonly known as: COLESTID   denosumab 60 MG/ML Soln injection Commonly known as: PROLIA Inject 60 mg into the skin every  6 (six) months. Administer in upper arm, thigh, or abdomen   donepezil 10 MG tablet Commonly known as: ARICEPT TAKE 1 TABLET BY MOUTH AT BEDTIME   fluticasone 50 MCG/ACT nasal spray Commonly known as: FLONASE Place 2 sprays into both nostrils daily.   gabapentin 300 MG capsule Commonly known as: NEURONTIN TAKE 1 TO 2 CAPSULES BY MOUTH UP TO TWO TIMES DAILY AS NEEDED   leflunomide 20 MG tablet Commonly known as: ARAVA Take 1 tablet (20 mg total) by mouth daily.   losartan 50 MG tablet Commonly known as: COZAAR Take 2 tablets (100 mg total) by mouth daily. Take 1 tablet in the morning and 1 tablet in evening as needed for blood pressure greater than 150   Melatonin 10 MG Caps Take 10 capsules by mouth daily.   memantine 10 MG tablet Commonly known as: NAMENDA TAKE 1 TABLET TWICE DAILY   metFORMIN 500 MG 24 hr tablet Commonly known as: GLUCOPHAGE-XR Take 500 mg by mouth daily with breakfast.   metoprolol tartrate 100 MG tablet Commonly known as: LOPRESSOR TAKE 1 AND 1/2 TABLETS TWICE DAILY   MiraLax 17 GM/SCOOP powder Generic drug: polyethylene glycol powder Take 17 g by mouth daily as needed for mild constipation or moderate constipation.   ondansetron 4 MG tablet Commonly known as: Zofran Take 1 tablet (4 mg total) by mouth daily as needed for nausea or vomiting.   ondansetron 8 MG disintegrating tablet Commonly known as: ZOFRAN-ODT Take 1 tablet by mouth as needed for nausea.   pantoprazole 40 MG tablet Commonly known as: PROTONIX Take 40 mg by mouth 2 (two) times daily.   primidone 50 MG tablet Commonly known as: MYSOLINE TAKE 1 TABLET AT BEDTIME   venlafaxine XR 75 MG 24 hr capsule Commonly known as: EFFEXOR-XR TAKE 1 CAPSULE EVERY DAY WITH  BREAKFAST   VITAMIN B-12 IJ Inject 1 mL as directed every 30 (thirty) days.         OBJECTIVE:   PHYSICAL EXAM: VS: BP 134/80 (BP Location: Left Arm, Patient Position: Sitting, Cuff Size: Normal)   Pulse 64   Ht _0  (1.676 m)   Wt 183 lb 3.2 oz (83.1 kg)   SpO2 96%   BMI 29.57 kg/m    EXAM: General: Pt appears well and is in NAD  Lungs: Clear with good BS bilat with no rales, rhonchi, or wheezes  Heart: Auscultation: RRR.  Abdomen: Normoactive bowel sounds, soft, nontender, without masses or organomegaly palpable  Extremities:  BL LE: Trace pretibial edema   Mental Status: Judgment, insight: Intact Orientation: Oriented to time, place, and person Mood and affect: No depression, anxiety, or agitation     DATA REVIEWED: Results for Mia, [name] (MRN [id number]) as of 06/12/2020 07:32  Ref. Range 06/10/2020 12:00  Sodium Latest Ref Range: 135 - 146 mmol/L 137  Potassium Latest Ref Range: 3.5 - 5.3 mmol/L 4.2  Chloride Latest Ref Range: 98 - 110 mmol/L 102  CO2 Latest Ref Range: 20 - 32 mmol/L 28  Glucose Latest Ref Range: 65 - 99 mg/dL 130 (H)  BUN Latest Ref Range: 7 - 25 mg/dL 8  Creatinine Latest Ref Range: 0.50 - 0.99 mg/dL 0.84  Calcium Latest Ref Range: 8.6 - 10.4 mg/dL 9.8  BUN/Creatinine Ratio Latest Ref Range: 6 - 22 (calc) NOT APPLICABLE  AG Ratio Latest Ref Range: 1.0 - 2.5 (calc) 1.7  AST Latest Ref Range: 10 - 35 U/L 31  ALT Latest Ref Range: 6 - 29  U/L 27  Total Protein Latest Ref Range: 6.1 - 8.1 g/dL 6.4  Total Bilirubin Latest Ref Range: 0.2 - 1.2 mg/dL 0.4  GFR, Est Non African American Latest Ref Range: > OR = 60 mL/min/1.1m 71  GFR, Est African American Latest Ref Range: > OR = 60 mL/min/1.72m83  Results for Mia, WOGANMRN [id number] of 06/12/2020 07:32  Ref. Range 05/21/2020 08:19  Sodium Latest Ref Range: 135 - 145 mEq/L 130 (L)  Potassium Latest Ref Range: 3.5 - 5.1 mEq/L 3.5  Chloride Latest Ref Range: 96 - 112 mEq/L 92 (L)  CO2  Latest Ref Range: 19 - 32 mEq/L 29  Glucose Latest Ref Range: 70 - 99 mg/dL 113 (H)  BUN Latest Ref Range: 6 - 23 mg/dL 12  Creatinine Latest Ref Range: 0.40 - 1.20 mg/dL 0.77  Calcium Latest Ref Range: 8.4 - 10.5 mg/dL 9.8  Alkaline Phosphatase Latest Ref Range: 39 - 117 U/L 73  Albumin Latest Ref Range: 3.5 - 5.2 g/dL 4.0  AST Latest Ref Range: 0 - 37 U/L 26  ALT Latest Ref Range: 0 - 35 U/L 25  Total Protein Latest Ref Range: 6.0 - 8.3 g/dL 6.4  Total Bilirubin Latest Ref Range: 0.2 - 1.2 mg/dL 0.4  GFR Latest Ref Range: >60.00 mL/min 74.39  Osmolality Latest Ref Range: 278 - 305 mOsm/kg 271 (L)  TSH Latest Ref Range: 0.35 - 4.50 uIU/mL 1.34  Osmolality, Urine Latest Ref Range: 50 - 1,200 mOsm/kg 209  Sodium, Urine Latest Ref Range: 28 - 272 mmol/L <10 (L)      ASSESSMENT / PLAN / RECOMMENDATIONS:   1. Primary Polydipsia:   - This is her main cause for hyponatremia given low urine Osm and almost non detectable urine sodium. - HCTZ could also be a contributing factor but it is NOT the main cause.  - Pt used to dink up to 160 ounces of liquid daily. She has made conscious effort in limiting this, with normalization of her sodium  - I am also concerned that she has PICA given her "ice craving " pt with anemia that is improving.  - Primary Polydipsia is beyond the scope of endocrinology, Some of these patients may end up having to be referred to a mental health specialist, but at this time she is able to control her water intake.  - I have cautioned her against drinking Gatorade, as it has sodium which could exacerbate her swelling - Pt under the impression that her diarrhea is the cause of this, but I explained to her that potassium has been normal.   - Pt to limit liquid intake to 65 oz daily     F/U in 3 months    Signed electronically by: AbMack GuiseMD  [facility]  [address] [address]Owos[address] StLebamrNew Galilee[place]  [id number]: [phone]: [phone]    CC: MeHali MarryMDPhiladelphiaCBr[id number]: [phone]: [phone] Return to Endocrinology clinic as below: Future Appointments  Date Time Provider DeNanakuli8/26/2021 10:15 AM TaEvans LanceMD [facility]  07/09/2020 10:00 AM PCK NURSE [facility]K-PC[facility] None  08/09/2020 10:20 AM DaOfilia NeasPA-C [facility] None  08/28/2020 10:30 AM MiWard GivensNP [facility] None  09/11/2020 10:50 AM MeHali MarryMD PCK-PCK None  09/17/2020  9:50 AM Bobbette Eakes, IbMelanie CrazierMD [facility]PC[facility] [place]

## 2020-06-11 NOTE — Progress Notes (Signed)
Pt came in today for B12 injection. Injection  tolerated well given in RUOQ.  Pt reports no negative side effects from medication. Denies any dizziness, chest pain or palpitations, and no GI problems.   Pt to RTC in 30 days for next injection

## 2020-06-11 NOTE — Assessment & Plan Note (Signed)
Unclear etiology.  Will start by holding statin for 3-4 weeks and see if sxs improve.

## 2020-06-11 NOTE — Assessment & Plan Note (Signed)
Given B12 injection today. Schedule f/u

## 2020-06-11 NOTE — Patient Instructions (Signed)
Hold your atorvastatin for 3-4 weeks and see if you feels your leg strength is better.

## 2020-06-11 NOTE — Patient Instructions (Signed)
-   Your sodium is normal. Please continue to stay off hydrochlorothiazide and limiting your liquids to 65 ounce a day or less.

## 2020-06-11 NOTE — Assessment & Plan Note (Signed)
A1C improved to 6.0 this time. Continue to work on healthy choices and reducing sugar and carb intake. F/U  In 6 mo

## 2020-06-11 NOTE — Progress Notes (Signed)
CBC is stable.  Glucose is mildly elevated most likely not fasting.

## 2020-06-12 DIAGNOSIS — E232 Diabetes insipidus: Secondary | ICD-10-CM | POA: Insufficient documentation

## 2020-06-12 DIAGNOSIS — Z1231 Encounter for screening mammogram for malignant neoplasm of breast: Secondary | ICD-10-CM | POA: Diagnosis not present

## 2020-06-12 DIAGNOSIS — R631 Polydipsia: Secondary | ICD-10-CM | POA: Insufficient documentation

## 2020-06-12 LAB — HM MAMMOGRAPHY

## 2020-06-12 LAB — LIPASE: Lipase: 20 U/L (ref 7–60)

## 2020-06-13 ENCOUNTER — Ambulatory Visit: Payer: Medicare Other

## 2020-06-13 ENCOUNTER — Ambulatory Visit: Admit: 2020-06-13 | Discharge: 2020-06-13 | Payer: MEDICARE | Primary: Internal Medicine

## 2020-06-13 ENCOUNTER — Inpatient Hospital Stay: Admit: 2020-06-13 | Payer: MEDICARE | Primary: Internal Medicine

## 2020-06-13 DIAGNOSIS — E119 Type 2 diabetes mellitus without complications: Secondary | ICD-10-CM

## 2020-06-13 LAB — METABOLIC PANEL, COMPREHENSIVE
A-G Ratio: 1.1 (ref 0.8–1.7)
ALT (SGPT): 35 U/L (ref 13–56)
AST (SGOT): 21 U/L (ref 10–38)
Albumin: 3.5 g/dL (ref 3.4–5.0)
Alk. phosphatase: 71 U/L (ref 45–117)
Anion gap: 5 mmol/L (ref 3.0–18)
BUN/Creatinine ratio: 10 — ABNORMAL LOW (ref 12–20)
BUN: 7 MG/DL (ref 7.0–18)
Bilirubin, total: 0.4 MG/DL (ref 0.2–1.0)
CO2: 28 mmol/L (ref 21–32)
Calcium: 8.6 MG/DL (ref 8.5–10.1)
Chloride: 108 mmol/L (ref 100–111)
Creatinine: 0.68 MG/DL (ref 0.6–1.3)
GFR est AA: >60 mL/min/{1.73_m2} (ref >60)
GFR est non-AA: >60 mL/min/{1.73_m2} (ref >60)
Globulin: 3.1 g/dL (ref 2.0–4.0)
Glucose: 109 mg/dL — ABNORMAL HIGH (ref 74–99)
Potassium: 4 mmol/L (ref 3.5–5.5)
Protein, total: 6.6 g/dL (ref 6.4–8.2)
Sodium: 141 mmol/L (ref 136–145)

## 2020-06-13 LAB — CBC W/O DIFF
HCT: 32 % — ABNORMAL LOW (ref 35.0–45.0)
HGB: 9.9 g/dL — ABNORMAL LOW (ref 12.0–16.0)
MCH: 31.4 PG (ref 24.0–34.0)
MCHC: 30.9 g/dL — ABNORMAL LOW (ref 31.0–37.0)
MCV: 101.6 FL — ABNORMAL HIGH (ref 74.0–97.0)
MPV: 9.7 FL (ref 9.2–11.8)
PLATELET: 152 10*3/uL (ref 135–420)
RBC: 3.15 M/uL — ABNORMAL LOW (ref 4.20–5.30)
RDW: 15.2 % — ABNORMAL HIGH (ref 11.6–14.5)
WBC: 4.2 10*3/uL — ABNORMAL LOW (ref 4.6–13.2)

## 2020-06-13 LAB — HEMOGLOBIN A1C W/O EAG
Hemoglobin A1C: 5.5 % (ref 4.2–5.6)
Hemoglobin A1c: 5.5 % (ref 4.2–5.6)

## 2020-06-13 LAB — LIPID PANEL
CHOL/HDL Ratio: 3.1 (ref 0–5.0)
Chol/HDL Ratio: 3.1 (ref 0–5.0)
Cholesterol, Total: 156 MG/DL (ref <200)
Cholesterol, total: 156 MG/DL (ref <200)
HDL Cholesterol: 51 MG/DL (ref 40–60)
HDL: 51 MG/DL (ref 40–60)
LDL Calculated: 76.6 MG/DL (ref 0–100)
LDL, calculated: 76.6 MG/DL (ref 0–100)
Triglyceride: 142 MG/DL (ref <150)
Triglycerides: 142 MG/DL (ref <150)
VLDL Cholesterol Calculated: 28.4 MG/DL
VLDL, calculated: 28.4 MG/DL

## 2020-06-13 LAB — VITAMIN D, 25 HYDROXY: Vitamin D 25-Hydroxy: 34.8 ng/mL (ref 30–100)

## 2020-06-13 LAB — CBC
Hematocrit: 32 % — ABNORMAL LOW (ref 35.0–45.0)
Hemoglobin: 9.9 g/dL — ABNORMAL LOW (ref 12.0–16.0)
MCH: 31.4 PG (ref 24.0–34.0)
MCHC: 30.9 g/dL — ABNORMAL LOW (ref 31.0–37.0)
MCV: 101.6 FL — ABNORMAL HIGH (ref 74.0–97.0)
MPV: 9.7 FL (ref 9.2–11.8)
Platelets: 152 10*3/uL (ref 135–420)
RBC: 3.15 M/uL — ABNORMAL LOW (ref 4.20–5.30)
RDW: 15.2 % — ABNORMAL HIGH (ref 11.6–14.5)
WBC: 4.2 10*3/uL — ABNORMAL LOW (ref 4.6–13.2)

## 2020-06-13 LAB — COMPREHENSIVE METABOLIC PANEL
ALT: 35 U/L (ref 13–56)
AST: 21 U/L (ref 10–38)
Albumin/Globulin Ratio: 1.1 (ref 0.8–1.7)
Albumin: 3.5 g/dL (ref 3.4–5.0)
Alkaline Phosphatase: 71 U/L (ref 45–117)
Anion Gap: 5 mmol/L (ref 3.0–18)
BUN: 7 MG/DL (ref 7.0–18)
Bun/Cre Ratio: 10 — ABNORMAL LOW (ref 12–20)
CO2: 28 mmol/L (ref 21–32)
Calcium: 8.6 MG/DL (ref 8.5–10.1)
Chloride: 108 mmol/L (ref 100–111)
Creatinine: 0.68 MG/DL (ref 0.6–1.3)
EGFR IF NonAfrican American: >60 mL/min/{1.73_m2} (ref >60)
GFR African American: >60 mL/min/{1.73_m2} (ref >60)
Globulin: 3.1 g/dL (ref 2.0–4.0)
Glucose: 109 mg/dL — ABNORMAL HIGH (ref 74–99)
Potassium: 4 mmol/L (ref 3.5–5.5)
Sodium: 141 mmol/L (ref 136–145)
Total Bilirubin: 0.4 MG/DL (ref 0.2–1.0)
Total Protein: 6.6 g/dL (ref 6.4–8.2)

## 2020-06-13 LAB — VITAMIN D 25 HYDROXY: Vit D, 25-Hydroxy: 34.8 ng/mL (ref 30–100)

## 2020-06-14 NOTE — Assessment & Plan Note (Signed)
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

## 2020-06-14 NOTE — Assessment & Plan Note (Signed)
Unclear etiology at this point.  Contender could be related to her statin we discussed holding her statin for 3 to 4 weeks to see if she notices a difference if not then please restart the medication and let me know and we can do additional work - up.  She does have significant atrophy of some of the muscles of her right thigh particularly her quadriceps.  So could consider nerve conduction study and or getting her in with the orthopedist.

## 2020-06-17 NOTE — Progress Notes (Signed)
Patient presented to the office in person on 06/17/20 to notify Dr. Jennye Boroughs and staff that she has chosen to have surgery elsewhere in the [facility] system. She said that it is not a reflection on Dr. Jennye Boroughs but rather a personal choice to remain with [facility].    Will notify Dr. Jennye Boroughs.

## 2020-06-18 DIAGNOSIS — M545 Low back pain: Secondary | ICD-10-CM | POA: Diagnosis not present

## 2020-06-18 DIAGNOSIS — M542 Cervicalgia: Secondary | ICD-10-CM | POA: Diagnosis not present

## 2020-06-20 ENCOUNTER — Other Ambulatory Visit: Payer: Self-pay

## 2020-06-20 ENCOUNTER — Ambulatory Visit (INDEPENDENT_AMBULATORY_CARE_PROVIDER_SITE_OTHER): Payer: Medicare Other | Admitting: Internal Medicine

## 2020-06-20 ENCOUNTER — Encounter: Payer: Self-pay | Admitting: Internal Medicine

## 2020-06-20 ENCOUNTER — Ambulatory Visit: Admit: 2020-06-20 | Discharge: 2020-06-20 | Payer: MEDICARE | Attending: Internal Medicine | Primary: Internal Medicine

## 2020-06-20 ENCOUNTER — Ambulatory Visit: Attending: Internal Medicine | Primary: Internal Medicine

## 2020-06-20 VITALS — BP 160/92 | HR 64 | Ht 66.0 in | Wt 180.0 lb

## 2020-06-20 DIAGNOSIS — E119 Type 2 diabetes mellitus without complications: Secondary | ICD-10-CM

## 2020-06-20 DIAGNOSIS — R0609 Other forms of dyspnea: Secondary | ICD-10-CM

## 2020-06-20 DIAGNOSIS — R0600 Dyspnea, unspecified: Secondary | ICD-10-CM | POA: Diagnosis not present

## 2020-06-20 DIAGNOSIS — I10 Essential (primary) hypertension: Secondary | ICD-10-CM | POA: Diagnosis not present

## 2020-06-20 DIAGNOSIS — I471 Supraventricular tachycardia: Secondary | ICD-10-CM | POA: Diagnosis not present

## 2020-06-20 MED ORDER — AMLODIPINE BESYLATE 5 MG PO TABS
5.0000 mg | ORAL_TABLET | Freq: Every day | ORAL | 3 refills | Status: DC
Start: 2020-06-20 — End: 2021-09-30

## 2020-06-20 MED ORDER — AMLODIPINE BESYLATE 5 MG PO TABS
5.0000 mg | ORAL_TABLET | Freq: Every day | ORAL | 0 refills | Status: DC
Start: 2020-06-20 — End: 2020-09-11

## 2020-06-20 NOTE — Progress Notes (Signed)
HPI [name] returns today for followup.She is a pleasant 69 yo woman with HTN and SVT who I saw several weeks ago. Because she has had no symptoms of SVT. However, she notes that her bp has been elevated, especially at night time. She denies dietary indiscretion. She has not had peripheral edema. Because her symptoms occurred with exertion, I asked her to undergo exercise testing which was limited by her weakness. She did not have SVT.  Allergies  Allergen Reactions  . Codeine Nausea And Vomiting  . Myrbetriq  [Mirabegron] Other (See Comments)    SEVERE HEADACHE  . Percocet [Oxycodone-Acetaminophen] Nausea And Vomiting  . Celebrex [Celecoxib] Other (See Comments)    Other reaction(s): Other flushed  . Darvocet [Propoxyphene N-Acetaminophen] Nausea And Vomiting  . Erythromycin Nausea And Vomiting    Nausea and vomiting   . Hydrocodone Nausea And Vomiting  . Lyrica [Pregabalin] Swelling    Legs swelling   . Macrodantin [Nitrofurantoin] Nausea And Vomiting  . Toradol [Ketorolac Tromethamine] Nausea And Vomiting    Per patient, only PO form causes nausea and vomiting. Can take injection without issue  . Tramadol Nausea And Vomiting  . Verapamil Nausea And Vomiting    REACTION: intolerance     Current Outpatient Medications  Medication Sig Dispense Refill  . atorvastatin (LIPITOR) 40 MG tablet TAKE 1 TABLET EVERY DAY 90 tablet 3  . blood glucose meter kit and supplies Dispense based on patient and insurance.  Check fasting blood sugar in the morning and as needed DX: R73.03 1 each 0  . colestipol (COLESTID) 1 g tablet     . Cyanocobalamin (VITAMIN B-12 IJ) Inject 1 mL as directed every 30 (thirty) days.     Marland Kitchen denosumab (PROLIA) 60 MG/ML SOLN injection Inject 60 mg into the skin every 6 (six) months. Administer in upper arm, thigh, or abdomen    . donepezil (ARICEPT) 10 MG tablet TAKE 1 TABLET BY MOUTH AT BEDTIME 90 tablet 3  . fluticasone (FLONASE) 50 MCG/ACT nasal spray  Place 2 sprays into both nostrils daily. 16 g 6  . gabapentin (NEURONTIN) 300 MG capsule TAKE 1 TO 2 CAPSULES BY MOUTH UP TO TWO TIMES DAILY AS NEEDED 180 capsule 3  . leflunomide (ARAVA) 20 MG tablet Take 1 tablet (20 mg total) by mouth daily. 30 tablet 0  . losartan (COZAAR) 50 MG tablet Take 2 tablets (100 mg total) by mouth daily. Take 1 tablet in the morning and 1 tablet in evening as needed for blood pressure greater than 150 60 tablet 0  . Melatonin 10 MG CAPS Take 10 capsules by mouth daily.    . memantine (NAMENDA) 10 MG tablet TAKE 1 TABLET TWICE DAILY 180 tablet 4  . metFORMIN (GLUCOPHAGE-XR) 500 MG 24 hr tablet Take 500 mg by mouth daily with breakfast.    . metoprolol tartrate (LOPRESSOR) 100 MG tablet TAKE 1 AND 1/2 TABLETS TWICE DAILY 270 tablet 3  . ondansetron (ZOFRAN) 4 MG tablet Take 1 tablet (4 mg total) by mouth daily as needed for nausea or vomiting. 30 tablet 1  . ondansetron (ZOFRAN-ODT) 8 MG disintegrating tablet Take 1 tablet by mouth as needed for nausea.    . pantoprazole (PROTONIX) 40 MG tablet Take 40 mg by mouth 2 (two) times daily.    . polyethylene glycol powder (MIRALAX) powder Take 17 g by mouth daily as needed for mild constipation or moderate constipation.     . primidone (MYSOLINE) 50  MG tablet TAKE 1 TABLET AT BEDTIME 90 tablet 1  . venlafaxine XR (EFFEXOR-XR) 75 MG 24 hr capsule TAKE 1 CAPSULE EVERY DAY WITH BREAKFAST 90 capsule 3   No current facility-administered medications for this visit.     Past Medical History:  Diagnosis Date  . Anal fissure   . Anemia   . Arthritis   . Blood transfusion   . C. difficile colitis   . Chest pain    a. 01/2013 MV: EF 59%, no ischemia.  . Chronic Dyspnea    a. 01/2013 Echo: EF 60-65%, Gr 1 DD, PASP 24mmHg.// Echo 01/2020: EF 60-65, no RWMA, GR 1 DD, GLS -21.6%, normal RV SF, trivial MR   . COVID-19   . Diabetes mellitus   . Fibromyalgia   . Gall stones   . GERD (gastroesophageal reflux disease)    gastritis    . Hemorrhoids   . Hypertension   . Interstitial cystitis   . MCI (mild cognitive impairment) 11/25/2017  . Memory difficulty 12/14/2013  . Neuromuscular disorder (HCC)    sclerosis  . Osteoporosis   . Peptic ulcer   . PONV (postoperative nausea and vomiting)   . Pseudogout   . Raynaud phenomenon   . Rectal bleeding   . Sleep apnea    a. on cpap.  . SVT (supraventricular tachycardia) (HCC)   . Syncope 09/10/2015  . Thyroid disease    hypothyroidism  . Tremor, essential 05/14/2016    ROS:   All systems reviewed and negative except as noted in the HPI.   Past Surgical History:  Procedure Laterality Date  . APPENDECTOMY    . BLADDER SURGERY     x2  . BLADDER SURGERY    . BREAST BIOPSY    . CARDIAC CATHETERIZATION    . CARPAL TUNNEL RELEASE    . CATARACT EXTRACTION Bilateral   . CHOLECYSTECTOMY    . DILATION AND CURETTAGE OF UTERUS    . ENTEROCELE REPAIR     x2  . EYE SURGERY     retina  . hysterectomy - unknown type    . JOINT REPLACEMENT    . KNEE ARTHROPLASTY    . KNEE SURGERY     x6  . NECK SURGERY     fusion  . OOPHORECTOMY    . QUADRICEPS REPAIR Right   . RECTOCELE REPAIR     x2  . SHOULDER ARTHROSCOPY WITH SUBACROMIAL DECOMPRESSION, ROTATOR CUFF REPAIR AND BICEP TENDON REPAIR  10/06/2012   Procedure: SHOULDER ARTHROSCOPY WITH SUBACROMIAL DECOMPRESSION, ROTATOR CUFF REPAIR AND BICEP TENDON REPAIR;  Surgeon: Justin William Chandler, MD;  Location: [facility];  Service: Orthopedics;  Laterality: Right;  Arthroscopic  Repair  of  Subscapularis, Open Biceps Tenodesis  . SHOULDER SURGERY     bilateral- bones spur  . TONSILLECTOMY    . TOTAL KNEE ARTHROPLASTY    . TOTAL SHOULDER ARTHROPLASTY       Family History  Problem Relation Age of Onset  . Heart attack Mother   . Stroke Mother   . Diabetes Mother   . Heart disease Mother   . Colon polyps Mother   . Asthma Mother   . Breast cancer Maternal Grandmother   . Wilson's disease Maternal  Grandmother   . Pancreatic cancer Maternal Grandfather   . Heart disease Father   . Heart attack Father   . Asthma Father   . Stroke Sister   . Hypertension Sister   . Rheum arthritis Sister   .   Dementia Sister   . Asthma Sister   . Lupus Sister   . Asthma Sister      Social History   Socioeconomic History  . Marital status: Married    Spouse name: Not on file  . Number of children: 2  . Years of education: 55  . Highest education level: Not on file  Occupational History  . Occupation: Retired  Tobacco Use  . Smoking status: Never Smoker  . Smokeless tobacco: Never Used  Vaping Use  . Vaping Use: Never used  Substance and Sexual Activity  . Alcohol use: No    Alcohol/week: 0.0 standard drinks  . Drug use: No  . Sexual activity: Not on file  Other Topics Concern  . Not on file  Social History Narrative   Patient drinks about 3-4 cups of caffeine daily.   Patient is right handed.   Social Determinants of Health   Financial Resource Strain:   . Difficulty of Paying Living Expenses: Not on file  Food Insecurity:   . Worried About Charity fundraiser in the Last Year: Not on file  . Ran Out of Food in the Last Year: Not on file  Transportation Needs:   . Lack of Transportation (Medical): Not on file  . Lack of Transportation (Non-Medical): Not on file  Physical Activity:   . Days of Exercise per Week: Not on file  . Minutes of Exercise per Session: Not on file  Stress:   . Feeling of Stress : Not on file  Social Connections:   . Frequency of Communication with Friends and Family: Not on file  . Frequency of Social Gatherings with Friends and Family: Not on file  . Attends Religious Services: Not on file  . Active Member of Clubs or Organizations: Not on file  . Attends Archivist Meetings: Not on file  . Marital Status: Not on file  Intimate Partner Violence:   . Fear of Current or Ex-Partner: Not on file  . Emotionally Abused: Not on file  .  Physically Abused: Not on file  . Sexually Abused: Not on file     BP (!) 160/92   Pulse 64   Ht 5' 6" (1.676 m)   Wt 180 lb (81.6 kg)   SpO2 97%   BMI 29.05 kg/m   Physical Exam:  Well appearing NAD HEENT: Unremarkable Neck:  No JVD, no thyromegally Lymphatics:  No adenopathy Back:  No CVA tenderness Lungs:  Clear HEART:  Regular rate rhythm, no murmurs, no rubs, no clicks Abd:  soft, positive bowel sounds, no organomegally, no rebound, no guarding Ext:  2 plus pulses, no edema, no cyanosis, no clubbing Skin:  No rashes no nodules Neuro:  CN II through XII intact, motor grossly intact  EKG - nsr  Assess/Plan: 1. SVT - her symptoms appear mostly controlled. She will continue her beta blocker. 2. HTN - her bp is not controlled. I have asked her to start amlodipine 5 mg daily. 3. Obesity - she is encouraged to lose weight. 4. Dyslipidemia - she will continue her lipitor.   Carleene Overlie Brithney Bensen,MD

## 2020-06-20 NOTE — Patient Instructions (Addendum)
Medication Instructions:  Your physician has recommended you make the following change in your medication:   1.  Start taking amlodipine 5 mg- Take one tablet by mouth daily at bedtime.   Labwork: None ordered.  Testing/Procedures: None ordered.  Follow-Up: Your physician wants you to follow-up in: 6 months with Dr. Lovena Le.   You will receive a reminder letter in the mail two months in advance. If you don't receive a letter, please call our office to schedule the follow-up appointment.  Any Other Special Instructions Will Be Listed Below (If Applicable).  If you need a refill on your cardiac medications before your next appointment, please call your pharmacy.

## 2020-06-20 NOTE — Progress Notes (Signed)
69 y.o. white female who presents for f/u    She was dx'ed with breast cancer earlier this year and has been undergoing tx as below.  She is changing over to Dr Mariea Clonts and has consultation scheduled in th enear future as she finishes up her neoadjuvant chemo.  She had a rough course w at least one hospitalization for neutropenia    Denied any cardiovascular complaints, the edema is not problematic at this time.  She has not been able to exercise    No gi or gu issues outside of chemo related GI distress, currently constipation    She does note some tingling and numbness in the feet since chemo began, no priod dx;ed neuropathy with the DM    Denies polyuria, polydipsia, nocturia, vision change.      No sx referable to the thyroid    LAST MEDICARE WELLNESS EXAM: 11/10/16, 11/23/17, 11/29/18, 12/14/19    Past Medical History:   Diagnosis Date   ??? Allergic rhinitis    ??? Anxiety    ??? Breast cancer ([place]) 01/2020    Dr Jennye Boroughs; LEFT T2N0M0 invasive adenoca  ER/PR/Her2 neg Dr Meryl Crutch neoadjuvant AC-Taxol   ??? Compression fx, thoracic spine ([place]) 2007    negative DEXA Dr. Marisa Hua   ??? Diabetes ([place]) 10/2017    on basis of fbs>125; intol metformin   ??? Dyslipidemia    ??? Dyspepsia    ??? Endometrial cancer ([place]) 02/2017    Dr Sheral Flow; stage 1B gr 1 endometroid adenoca w foci squamous diff of the endometrium; RA TLH BSO PLND, 0/13LN, MSI intact   ??? Frozen shoulder     right Dr. Wynonia Hazard MRI   ??? Left thyroid nodule 04/2016    1cm nodule; apparently noted on CT 2008 and unchanged   ??? Multiple lung nodules     no change 10/06, 03/07, 03/08   ??? 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 Osteoarthritis     Dr. Synetta Shadow, Dr. Marisa Hua   ??? Overweight (BMI 25.0-29.9)     IF 7/18 start weight 168 lbs not doing    ??? Painless hematuria 04/2016    Dr Jimmye Norman, neg eval   ??? Palpitations 2008    neg thallium 2008, nl holter 2008, echo nl lv/ef 65%/tr mr/dd/nl pasp   ??? Syncope     neurocardiogenic by tilt 1994   ??? Venous  insufficiency    ??? Vitamin D deficiency      Past Surgical History:   Procedure Laterality Date   ??? ECHO 2D ADULT      EF 70% (12/04);  ef 60%, mild MR (7/21) [facility]   ??? HX BREAST BIOPSY Left 2021    Dr Jennye Boroughs   ??? HX COLONOSCOPY      Dr Rosendo Gros 2007 neg; 04/22/18 neg   ??? HX GYN      s/p BTL    ??? HX HEMORRHOIDECTOMY      Dr. Rosendo Gros 2007   ??? HX HYSTERECTOMY  03/11/2017    Dr Sheral Flow   ??? HX ORTHOPAEDIC      DEXA -0.5 spine, -0.8 hip (1/18)   ??? HX UROLOGICAL  06/2016    Dr Jimmye Norman; bladder bx showed benign lesions   ??? PR CARDIAC SURG PROCEDURE UNLIST      thallium 2005 neg; NST 1/17 negative ef 75%   ??? [place] ABD COMP  9/05    negative   ??? VAS CAROTID DUPLEX BILATERAL  2004  negative     Social History     Socioeconomic History   ??? Marital status: MARRIED     Spouse name: Not on file   ??? Number of children: 2   ??? Years of education: Not on file   ??? Highest education level: Not on file   Occupational History   ??? Occupation: benefits specialist   Tobacco Use   ??? Smoking status: Never Smoker   ??? Smokeless tobacco: Never Used   Substance and Sexual Activity   ??? Alcohol use: Yes     Alcohol/week: 4.0 standard drinks     Types: 4 Glasses of wine per week   ??? Drug use: No   ??? Sexual activity: Not on file   Other Topics Concern   ??? Not on file   Social History Narrative   ??? Not on file     Social Determinants of Health     Financial Resource Strain:    ??? Difficulty of Paying Living Expenses:    Food Insecurity:    ??? Worried About Charity fundraiser in the Last Year:    ??? Arboriculturist in the Last Year:    Transportation Needs:    ??? Film/video editor (Medical):    ??? Lack of Transportation (Non-Medical):    Physical Activity:    ??? Days of Exercise per Week:    ??? Minutes of Exercise per Session:    Stress:    ??? Feeling of Stress :    Social Connections:    ??? Frequency of Communication with Friends and Family:    ??? Frequency of Social Gatherings with Friends and Family:    ??? Attends Religious Services:    ??? Museum/gallery conservator or Organizations:    ??? Attends Music therapist:    ??? Marital Status:    Intimate Production manager Violence:    ??? Fear of Current or Ex-Partner:    ??? Emotionally Abused:    ??? Physically Abused:    ??? Sexually Abused:      Current Outpatient Medications   Medication Sig   ??? lansoprazole (PREVACID) 30 mg capsule take 1 capsule by mouth BEFORE BREAKFAST DAILY   ??? Januvia 100 mg tablet take 1 tablet by mouth once daily   ??? ergocalciferol (Vitamin D2) 1,250 mcg (50,000 unit) capsule Take 1 Cap by mouth every month.   ??? multivitamin (ONE A DAY) tablet Take 1 Tab by mouth daily.   ??? citalopram (CELEXA) 10 mg tablet take 1 tablet by mouth once daily   ??? atorvastatin (LIPITOR) 40 mg tablet take 1 tablet by mouth once daily   ??? Blood-Glucose Meter monitoring kit Use daily as directed   ??? glucose blood VI test strips (BLOOD GLUCOSE TEST) strip Use daily as directed   ??? lancets misc Use daily as directed   ??? omega-3 fatty acids (FISH OIL) cap Take 2 Tabs by mouth daily.   ??? aspirin 81 mg chewable tablet Take 81 mg by mouth daily.     No current facility-administered medications for this visit.     Allergies   Allergen Reactions   ??? Fish Oil Nausea Only     Patient denies   ??? Metformin Diarrhea   ??? Relafen [Nabumetone] Nausea Only     REVIEW OF SYSTEMS:  mammo 3/21, colo 6/19 Dr Rosendo Gros, DEXA 1/18 Dr Margaretann Loveless ??? no vision change or eye pain  Oral ??? no mouth pain,  tongue or tooth problems  Ears ??? no hearing loss, ear pain, fullness, no swallowing problems  Cardiac ??? no CP, PND, orthopnea, edema, palpitations or syncope  Chest ??? no breast masses  Resp ??? no wheezing, chronic coughing, dyspnea  Urinary ??? no dysuria, hematuria, flank pain, urgency, frequency    Visit Vitals  BP 114/68 (BP 1 Location: Right arm, BP Patient Position: Sitting, BP Cuff Size: Adult)   Pulse 99   Temp 97.3 ??F (36.3 ??C) (Temporal)   Resp 15   Wt 153 lb 9.6 oz (69.7 kg)   SpO2 98%   BMI 26.37 kg/m??   Affect is appropriate.  Mood stable  No  apparent distress  HEENT --Anicteric sclerae.  No JVD, or bruits.  Thyroid fullness on left  Lungs --Clear to auscultation and percussion, normal percussion.  Heart --Regular rate and rhythm, no murmurs, rubs, gallops, or clicks.  Abdomen -- Soft and nontender, no hepatosplenomegaly or masses.  Extremities -- Without cyanosis, clubbing, edema. 2+ pulses equally and bilaterally.  Varicose veins bilat, trace pedal edema symmetrically  Foot exam bilaterally showed  Reflexes 2+   Vibration dec in toes; proprioception, filament test intact  Pulses 1-2+ DP and PT  No deformities noted  No onychomycosis    LABS  From 5/10 showed   gluc 110, cr 0.70,               alt 23,                                    chol 154, tg 143, hdl 45, ldl-c 80,                                                           tsh 1.91  From 5/10 showed                     2 hr GTT 89  From 8/10 showed                                                              vit d 23.0, ck 57, aldo 5.4  From 8/11 showed   gluc 108,                                     hba1c 6.3,                   chol 160, tg 172, hdl 39, ldl-c 87,  wbc 5.,7 hb 12.3, plt 235, ua neg,     tsh 2.28  From 5/12 showed   gluc 113, cr 0.71, gfr 94,  alt 16, hba1c 6.2, ldl-p 1989, chol 177, tg 161, hdl 43, ldl-c 102  From 11/12 showed  hba1c 5.9, ldl-p 1218, chol 133, tg 116, hdl 45, ldl-c 65  From 5/13 showed   gluc 105, cr 0.55, gfr 102, alt 8,  hba1c 6.2,                   chol 148, tg 107, hdl 45, ldl-c 82,  wbc 5.0, hb 12.5, plt 172, vit d 40.3  From 11/13 showed        hba1c 6.2, ldl-p 1888, chol 176, tg 155, hdl 49, ldl-c 86,  wbc 6.2, hb 12.3, plt 182, vit d 34.4  From 5/14 showed        hba1c 6.2,     chol 152, tg 157, hdl 39, ldl-c 82  From 1/15 showed   gluc 113, cr 0.68, gfr>60, alt 11, hba1c 6.2,     chol 146, tg 130, hdl 43, ldl-c 77  From 7/15 showed        hba1c 6.3,     chol 133, tg 124, hdl 39, ldl-c 69, wbc 6.3, hb 12.2,  plt 193  From 6/16 showed   gluc 106, cr 0.74, gfr>60, alt 26, hba1c 6.3,     chol 126, tg 125, hdl 46, ldl-c 55, wbc 5.7, hb 13.2, plt 203, vit d 7.2,   tsh 1.69  From 12/16 showed gluc 110, cr 0.68, gfr>60, alt 30, hba1c 6.1,     chol 135, tg 147, hdl 47, ldl-c 59, wbc 6.0, hb 12.7, plt 197  From 6/17 showed   gluc 122, cr 0.71, gfr>60, alt 33, hba1c 6.2,     chol 153, tg 140, hdl 46, ldl-c 79, wbc 6.3, hb 13.1, plt 190,        tsh 1.92, hep c neg  From 1/18 showed        hba1c 6.2,    chol 154, tg 181, hdl 41, ldl-c 77  From 7/18 showed   gluc 129, cr 0.63, gfr>60, alt 26, hba1c 6.1,              wbc 6.0, hb 12.4, plt 193, vit d 79  From 1/19 showed   gluc 132, cr 0.83, gfr>60,    hba1c 6.1  From 4/19 showed        hba1c 7.0, umar 12 ,  chol 124, tg 109, hdl 46, ldl-c 56,                 vit d 56.1  From 7/19 showed   gluc 106, cr 0.67, gfr>60, alt 25, hba1c 6.0,               wbc 5.8, hb 13.0, plt 183  From 1/20 showed       hba1c 6.0, umar 17,   chol 130, tg 113, hdl 46, ldl-c 61  From 8/20 showed   gluc 103, cr 0.73, gfr>60, alt 27, hba1c 5.7,     chol 124, tg 150, hdl 44, ldl-c 50,  wbc 5.0, hb13.0, plt 190  Form 2/21 showed        hba1c 5.9, umar na,          vit d 88.9      Results for orders placed or performed during the hospital encounter of 06/13/20   CBC W/O DIFF   Result Value Ref Range    WBC 4.2 (L) 4.6 - 13.2 K/uL    RBC 3.15 (L) 4.20 - 5.30 M/uL    HGB 9.9 (L) 12.0 - 16.0 g/dL    HCT  32.0 (L) 35.0 - 45.0 %    MCV 101.6 (H) 74.0 - 97.0 FL    MCH 31.4 24.0 - 34.0 PG    MCHC 30.9 (L) 31.0 - 37.0 g/dL    RDW 15.2 (H) 11.6 - 14.5 %    PLATELET 152 135 - 420 K/uL    MPV 9.7 9.2 - 11.8 FL   LIPID PANEL   Result Value Ref Range    LIPID PROFILE          Cholesterol, total 156 <200 MG/DL    Triglyceride 142 <150 MG/DL    HDL Cholesterol 51 40 - 60 MG/DL    LDL, calculated 76.6 0 - 100 MG/DL    VLDL, calculated 28.4 MG/DL    CHOL/HDL Ratio 3.1 0 - 5.0     METABOLIC PANEL, COMPREHENSIVE   Result Value Ref  Range    Sodium 141 136 - 145 mmol/L    Potassium 4.0 3.5 - 5.5 mmol/L    Chloride 108 100 - 111 mmol/L    CO2 28 21 - 32 mmol/L    Anion gap 5 3.0 - 18 mmol/L    Glucose 109 (H) 74 - 99 mg/dL    BUN 7 7.0 - 18 MG/DL    Creatinine 0.68 0.6 - 1.3 MG/DL    BUN/Creatinine ratio 10 (L) 12 - 20      GFR est AA >60 >60 ml/min/1.80m    GFR est non-AA >60 >60 ml/min/1.782m   Calcium 8.6 8.5 - 10.1 MG/DL    Bilirubin, total 0.4 0.2 - 1.0 MG/DL    ALT (SGPT) 35 13 - 56 U/L    AST (SGOT) 21 10 - 38 U/L    Alk. phosphatase 71 45 - 117 U/L    Protein, total 6.6 6.4 - 8.2 g/dL    Albumin 3.5 3.4 - 5.0 g/dL    Globulin 3.1 2.0 - 4.0 g/dL    A-G Ratio 1.1 0.8 - 1.7     VITAMIN D, 25 HYDROXY   Result Value Ref Range    Vitamin D 25-Hydroxy 34.8 30 - 100 ng/mL   HEMOGLOBIN A1C W/O EAG   Result Value Ref Range    Hemoglobin A1c 5.5 4.2 - 5.6 %     We reviewed the patient's labs from the last several visits to point out trends in the numbers          Patient Active Problem List   Diagnosis Code   ??? Vitamin D deficiency E55.9   ??? Anxiety F41.9   ??? Dyslipidemia E78.5   ??? 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 Overweight with body mass index (BMI) of 25 to 25.9 in adult E66.3, Z68.25   ??? Controlled type 2 diabetes mellitus, without long-term current use of insulin (HCC) E11.9   ??? Breast cancer of lower-outer quadrant of left female breast (HC[place]C50.512   ??? Neuropathy due to chemotherapeutic drug ([place]CL[place]G62.0, T45.1X5A     Assessment and plan:  1. DM.  Continue januvia and controlled.  F/u ophth  2. Hyperlipidemia. Continue lipitor and at target; consider vascepa once covered  3. Hypovitaminosis D. Continue current regimen.  4. Anxiety.  Continue celexa  5. Overweight.  Lifestyle and dietary measures.  Portion control reiterated.  6. Endometrial ca.  Per Dr SqSheral Flow7. Breast ca.  Per Dr S Meryl Crutchnd Dr ReMariea Clonts8. Possible neuropathy from chemo?  She will discuss with Dr DaSula Rumple  RTC 3/22    Above conditions discussed at length and patient  vocalized understanding.  All questions answered to patient satisfaction        ICD-10-CM ICD-9-CM    1. Controlled type 2 diabetes mellitus without complication, without long-term current use of insulin (HCC)  E11.9 250.00 HM DIABETES FOOT EXAM      METABOLIC PANEL, COMPREHENSIVE      LIPID PANEL      HEMOGLOBIN A1C WITH EAG   2. Malignant neoplasm of lower-outer quadrant of left breast of female, estrogen receptor negative (HCC)  C50.512 174.5     Z17.1 V86.1    3. Dyslipidemia  E78.5 272.4    4. Anxiety  F41.9 300.00

## 2020-06-20 NOTE — Progress Notes (Signed)
Pt is here for   Chief Complaint   Patient presents with   . Diabetes     follow up   . Cholesterol Problem     1. Have you been to the ER, urgent care clinic or hospitalized since your last visit? YES, April 25, 2020 at [facility] for low blood count    2. Have you seen or consulted any other health care providers outside of the [facility] since your last visit (Include any pap smears or colon screening)? YES, Dr Eulogio Ditch (Oncologist)     Do you have an Advanced Directive? YES    Would you like information on Advanced Directives? NO

## 2020-06-21 DIAGNOSIS — M545 Low back pain: Secondary | ICD-10-CM | POA: Diagnosis not present

## 2020-06-21 DIAGNOSIS — M542 Cervicalgia: Secondary | ICD-10-CM | POA: Diagnosis not present

## 2020-06-25 ENCOUNTER — Telehealth: Payer: Self-pay

## 2020-06-25 DIAGNOSIS — M542 Cervicalgia: Secondary | ICD-10-CM | POA: Diagnosis not present

## 2020-06-25 DIAGNOSIS — M545 Low back pain: Secondary | ICD-10-CM | POA: Diagnosis not present

## 2020-06-25 NOTE — Telephone Encounter (Signed)
Physical therapy Reevaluation paperwork has been signed by MD and faxed back

## 2020-06-26 ENCOUNTER — Encounter

## 2020-07-04 DIAGNOSIS — M542 Cervicalgia: Secondary | ICD-10-CM | POA: Diagnosis not present

## 2020-07-04 DIAGNOSIS — M545 Low back pain: Secondary | ICD-10-CM | POA: Diagnosis not present

## 2020-07-09 ENCOUNTER — Ambulatory Visit: Payer: Medicare Other

## 2020-07-09 DIAGNOSIS — M542 Cervicalgia: Secondary | ICD-10-CM | POA: Diagnosis not present

## 2020-07-09 DIAGNOSIS — M545 Low back pain: Secondary | ICD-10-CM | POA: Diagnosis not present

## 2020-07-10 ENCOUNTER — Ambulatory Visit (INDEPENDENT_AMBULATORY_CARE_PROVIDER_SITE_OTHER): Payer: Medicare Other | Admitting: Family Medicine

## 2020-07-10 ENCOUNTER — Other Ambulatory Visit: Payer: Self-pay

## 2020-07-10 ENCOUNTER — Encounter

## 2020-07-10 ENCOUNTER — Inpatient Hospital Stay: Admit: 2020-07-10 | Payer: MEDICARE | Attending: Gastroenterology | Primary: Internal Medicine

## 2020-07-10 VITALS — BP 145/60 | HR 59 | Ht 66.0 in | Wt 183.0 lb

## 2020-07-10 DIAGNOSIS — D510 Vitamin B12 deficiency anemia due to intrinsic factor deficiency: Secondary | ICD-10-CM

## 2020-07-10 DIAGNOSIS — Z23 Encounter for immunization: Secondary | ICD-10-CM

## 2020-07-10 DIAGNOSIS — C50512 Malignant neoplasm of lower-outer quadrant of left female breast: Secondary | ICD-10-CM

## 2020-07-10 MED ORDER — CYANOCOBALAMIN 1000 MCG/ML IJ SOLN
1000.0000 ug | Freq: Once | INTRAMUSCULAR | Status: AC
  Administered 2020-07-10: 1000 ug via INTRAMUSCULAR

## 2020-07-10 NOTE — Progress Notes (Signed)
Agree with documentation as above.   Zechariah Bissonnette, MD  

## 2020-07-10 NOTE — Progress Notes (Signed)
Pt came in today for B12 injection. Injection  tolerated well. Given in [place]. Pt reports no negative side effects from medication. Denies any dizziness, chest pain or palpitations, and no GI problems.. Pt to RTC in 1 month for next injection   Flu shot also given while pt was here.

## 2020-07-15 ENCOUNTER — Other Ambulatory Visit: Payer: Self-pay | Admitting: Neurology

## 2020-07-29 DIAGNOSIS — Z8619 Personal history of other infectious and parasitic diseases: Secondary | ICD-10-CM | POA: Diagnosis not present

## 2020-07-29 DIAGNOSIS — R197 Diarrhea, unspecified: Secondary | ICD-10-CM | POA: Diagnosis not present

## 2020-07-29 DIAGNOSIS — K219 Gastro-esophageal reflux disease without esophagitis: Secondary | ICD-10-CM | POA: Diagnosis not present

## 2020-07-29 NOTE — Progress Notes (Addendum)
Office Visit Note  Patient: Mia [name]             Date of Birth: 1951-10-17           MRN: [id number]             PCP: Hali Marry, MD Referring: Hali Marry, * Visit Date: 08/09/2020 Occupation: @GUAROCC @  Subjective:  Neck pain   History of Present Illness: Mia [name] is a 69 y.o. female with history of seronegative rheumatoid arthritis, osteoarthritis, pseudogout, fibromyalgia, and osteoporosis. She is taking arava 20 mg 1 tablet by mouth daily.  She denies any recent rheumatoid arthritis flares.  Patient reports that she is currently taking a prednisone taper which was prescribed by Dr. Lurene Shadow (orthopedic spinal surgeon) for the increased neck pain and stiffness she has been experiencing.  She will be completing the prednisone taper today.  She has known spinal stenosis of the lumbar region and DDD of the C-spine.  Patient reports that about 3 months ago she had her right carpal tunnel release by Dr. Grandville Silos at [facility].  She states that she followed up with him recently and continues to have some stiffness and discomfort in her right hand.  He recommended the use of meloxicam 7.5 mg twice daily for pain relief.  She was advised to discuss starting on meloxicam with [place].  She has not started the meloxicam yet.  She has been taking ibuprofen as needed for pain relief.  She continues to have generalized myalgias and muscle tenderness due to underlying fibromyalgia.  She has trapezius muscle tension and muscle tenderness bilaterally.  She denies any recent pseudogout flares. Her PCP continues to monitor her bone density.  She is on Prolia 60 mg subcutaneous injections every 6 months.    Activities of Daily Living:  Patient reports morning stiffness for 20 minutes.   Patient Reports nocturnal pain.  Difficulty dressing/grooming: Denies Difficulty climbing stairs: Reports Difficulty getting out of chair: Reports Difficulty using hands for taps, buttons,  cutlery, and/or writing: Denies  Review of Systems  Constitutional: Positive for fatigue.  HENT: Positive for mouth dryness.   Eyes: Positive for dryness.  Respiratory: Negative for shortness of breath.   Cardiovascular: Positive for swelling in legs/feet.  Gastrointestinal: Positive for diarrhea.  Endocrine: Positive for heat intolerance and increased urination.  Genitourinary: Negative for painful urination.  Musculoskeletal: Positive for arthralgias, gait problem, joint pain, joint swelling, muscle weakness, morning stiffness and muscle tenderness.  Skin: Negative for rash.  Allergic/Immunologic: Negative for susceptible to infections.  Neurological: Positive for weakness.  Hematological: Negative for bruising/bleeding tendency.  Psychiatric/Behavioral: Negative for sleep disturbance.    PMFS History:  Patient Active Problem List   Diagnosis Date Noted  . 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 Weakness of both lower extremities 06/11/2020  . Gastroenteritis 11/07/2019  . Gait instability 06/20/2019  . Facet arthritis of lumbar region 05/10/2018  . MCI (mild cognitive impairment) 11/25/2017  . Restrictive lung disease 07/30/2017  . Mixed hyperlipidemia 06/18/2017  . Rectocele 04/26/2017  . Irritable bowel syndrome with both constipation and diarrhea 04/26/2017  . Osteoporosis 04/18/2017  . Trochanteric bursitis of both hips 02/24/2017  . Stress fracture of left tibia 11/05/2016  . Inflammatory arthritis 09/30/2016  . High risk medication use 09/30/2016  . History of Clostridium difficile colitis 09/30/2016  . 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 Pseudogout 09/30/2016  . DDD cervical spine status post fusion 09/30/2016  . DDD thoracic spine 09/30/2016  .  H/O total knee replacement, right 09/30/2016  . Age-related osteoporosis without current pathological fracture 09/30/2016  . Tremor, essential 05/14/2016  . Right foot pain 04/14/2016  . B12 deficiency  09/10/2015  . Syncope 09/10/2015  . Cervical facet joint syndrome 09/10/2015  . Chronic fatigue 09/10/2015  . Aortic atherosclerosis ([place]) 04/01/2015  . DOE (dyspnea on exertion)   . Primary osteoarthritis of right knee 08/13/2014  . Benign head tremor 06/11/2014  . Lumbar degenerative disc disease 06/11/2014  . IFG (impaired fasting glucose) 03/09/2014  . Hemorrhoid 03/09/2014  . Retinal wrinkling, right eye 03/09/2014  . Fibromyalgia 03/09/2014  . GERD (gastroesophageal reflux disease) 03/09/2014  . History of arthroplasty of right knee 01/31/2014  . Memory difficulty 12/14/2013  . Hemorrhoids, external, thrombosed 06/22/2011  . Hypertension 03/11/2011  . SVT (supraventricular tachycardia) ([place])   . Raynaud phenomenon   . EDEMA 08/25/2010  . Nonspecific (abnormal) findings on radiological and other examination of body structure 08/25/2010  . COMPUTERIZED TOMOGRAPHY, CHEST, ABNORMAL 08/25/2010  . OSA (obstructive sleep apnea) 04/24/2009  . Allergic rhinitis 06/11/2008  . Dyspnea 06/11/2008  . 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 Chronic diastolic CHF (congestive heart failure) ([place]) 06/08/2008  . INTERSTITIAL CYSTITIS 06/08/2008    Past Medical History:  Diagnosis Date  . Anal fissure   . Anemia   . Arthritis   . Blood transfusion   . C. difficile colitis   . Chest pain    a. 01/2013 MV: EF 59%, no ischemia.  . Chronic Dyspnea    a. 01/2013 Echo: EF 60-65%, Gr 1 DD, PASP 97mmHg.// Echo 01/2020: EF 60-65, no RWMA, GR 1 DD, GLS -21.6%, normal RV SF, trivial MR   . COVID-19   . DDD (degenerative disc disease), cervical   . DDD (degenerative disc disease), lumbar   . Diabetes mellitus   . Fibromyalgia   . Gall stones   . GERD (gastroesophageal reflux disease)    gastritis  . Hemorrhoids   . Hypertension   . Interstitial cystitis   . MCI (mild cognitive impairment) 11/25/2017  . Memory difficulty 12/14/2013  . Neuromuscular disorder ([place])    sclerosis    . Osteoporosis   . Peptic ulcer   . PONV (postoperative nausea and vomiting)   . Pseudogout   . Raynaud phenomenon   . Rectal bleeding   . Sleep apnea    a. on cpap.  Marland Kitchen SVT (supraventricular tachycardia) ([place])   . Syncope 09/10/2015  . Thyroid disease    hypothyroidism  . Tremor, essential 05/14/2016    Family History  Problem Relation Age of Onset  . Heart attack Mother   . Stroke Mother   . Diabetes Mother   . Heart disease Mother   . Colon polyps Mother   . Asthma Mother   . Breast cancer Maternal Grandmother   . Wilson's disease Maternal Grandmother   . Pancreatic cancer Maternal Grandfather   . Heart disease Father   . Heart attack Father   . Asthma Father   . Stroke Sister   . Hypertension Sister   . Rheum arthritis Sister   . Dementia Sister   . Asthma Sister   . Lupus Sister   . Asthma Sister    Past Surgical History:  Procedure Laterality Date  . APPENDECTOMY    . BLADDER SURGERY     x2  . BLADDER SURGERY    . BREAST BIOPSY    . CARDIAC CATHETERIZATION    . CARPAL TUNNEL RELEASE    .  CATARACT EXTRACTION Bilateral   . CHOLECYSTECTOMY    . DILATION AND CURETTAGE OF UTERUS    . ENTEROCELE REPAIR     x2  . EYE SURGERY     retina  . hysterectomy - unknown type    . JOINT REPLACEMENT    . KNEE ARTHROPLASTY    . KNEE SURGERY     x6  . NECK SURGERY     fusion  . OOPHORECTOMY    . QUADRICEPS REPAIR Right   . RECTOCELE REPAIR     x2  . SHOULDER ARTHROSCOPY WITH SUBACROMIAL DECOMPRESSION, ROTATOR CUFF REPAIR AND BICEP TENDON REPAIR  10/06/2012   Procedure: SHOULDER ARTHROSCOPY WITH SUBACROMIAL DECOMPRESSION, ROTATOR CUFF REPAIR AND BICEP TENDON REPAIR;  Surgeon: Nita Sells, MD;  Location: [facility];  Service: Orthopedics;  Laterality: Right;  Arthroscopic  Repair  of  Subscapularis, Open Biceps Tenodesis  . SHOULDER SURGERY     bilateral- bones spur  . TONSILLECTOMY    . TOTAL KNEE ARTHROPLASTY    . TOTAL SHOULDER  ARTHROPLASTY     Social History   Social History Narrative   Patient drinks about 3-4 cups of caffeine daily.   Patient is right handed.   Immunization History  Administered Date(s) Administered  . Fluad Quad(high Dose 65+) 07/13/2019, 07/10/2020  . Influenza Split 07/26/2012  . Influenza Whole 07/30/2009, 08/19/2010  . Influenza, High Dose Seasonal PF 07/06/2016, 07/05/2017  . Influenza,inj,Quad PF,6+ Mos 06/11/2014, 07/25/2015, 06/23/2018  . Influenza-Unspecified 09/05/2013  . Moderna SARS-COVID-2 Vaccination 02/29/2020, 03/28/2020  . Pneumococcal Conjugate-13 01/04/2017  . Pneumococcal Polysaccharide-23 07/30/2009, 11/14/2018  . Tdap 02/13/2008, 05/12/2018  . Zoster 09/01/2011     Objective: Vital Signs: BP 134/76 (BP Location: Left Arm, Patient Position: Sitting, Cuff Size: Normal)   Pulse 71   Resp 16   Ht 5\' 6"  (1.676 m)   Wt 179 lb (81.2 kg)   BMI 28.89 kg/m    Physical Exam Vitals and nursing note reviewed.  Constitutional:      Appearance: She is well-developed.  HENT:     Head: Normocephalic and atraumatic.  Eyes:     Conjunctiva/sclera: Conjunctivae normal.  Pulmonary:     Effort: Pulmonary effort is normal.  Abdominal:     Palpations: Abdomen is soft.  Musculoskeletal:     Cervical back: Normal range of motion.  Skin:    General: Skin is warm and dry.     Capillary Refill: Capillary refill takes less than 2 seconds.  Neurological:     Mental Status: She is alert and oriented to person, place, and time.  Psychiatric:        Behavior: Behavior normal.      Musculoskeletal Exam:  C-spine limited ROM with lateral rotation.  Discomfort with flexion and extension of the C-spine. Postural thoracic kyphosis noted.  No midline spinal tenderness or SI joint tenderness.  Shoulder joints, elbow joints, wrist joints, MCPs, PIPs, and DIPs good ROM with no synovitis.  Synovial thickening of the right 1st and 2nd MCP joints.  Hip joints good ROM with no discomfort.   Right knee replacement has limited extension with warmth.  Left knee has good ROM with no warmth or effusion.   CDAI Exam: CDAI Score: 0.6  Patient Global: 3 mm; Provider Global: 3 mm Swollen: 0 ; Tender: 0  Joint Exam 08/09/2020   No joint exam has been documented for this visit   There is currently no information documented on the homunculus. Go to the  Rheumatology activity and complete the homunculus joint exam.  Investigation: No additional findings.  Imaging: No results found.  Recent Labs: Lab Results  Component Value Date   WBC 5.0 06/10/2020   HGB 11.6 (L) 06/10/2020   PLT 384 06/10/2020   NA 137 06/10/2020   K 4.2 06/10/2020   CL 102 06/10/2020   CO2 28 06/10/2020   GLUCOSE 130 (H) 06/10/2020   BUN 8 06/10/2020   CREATININE 0.84 06/10/2020   BILITOT 0.4 06/10/2020   ALKPHOS 73 05/21/2020   AST 31 06/10/2020   ALT 27 06/10/2020   PROT 6.4 06/10/2020   ALBUMIN 4.0 05/21/2020   CALCIUM 9.8 06/10/2020   GFRAA 83 06/10/2020    Speciality Comments: No specialty comments available.  Procedures:  No procedures performed Allergies: Codeine, Myrbetriq  [mirabegron], Percocet [oxycodone-acetaminophen], Propoxyphene, Nystatin, Celebrex [celecoxib], Darvocet [propoxyphene n-acetaminophen], Erythromycin, Hydrocodone, Lyrica [pregabalin], Macrodantin [nitrofurantoin], Toradol [ketorolac tromethamine], Tramadol, and Verapamil   Assessment / Plan:     Visit Diagnoses: Seronegative rheumatoid arthritis: She has no joint tenderness or synovitis on exam.  She has not had any recent rheumatoid arthritis flares.  She is clinically doing well on Arava 20 mg 1 tablet by mouth daily.  She is tolerating [place] and has not missed any doses recently.  She has synovial thickening of the right first and second MCP joints but no tenderness or inflammation was noted.  She experiences intermittent pain and stiffness in her right hand.  She underwent right carpal tunnel release surgery by Dr.  Grandville Silos about 3 months ago.  She was given a prescription for meloxicam 7.5 mg twice daily as needed for pain relief.  She was encouraged to take meloxicam sparingly for pain relief.  We will monitor her lab work closely.  Her next labs are due November 2021.  Standing orders are in place.  She will continue taking Arava 20 mg 1 tablet by mouth daily.  She does not need any refills at this time.  She was advised to notify us if she develops increased joint pain or joint swelling.  She will follow-up in the office in 5 months.  High risk medication use - Arava 20 mg 1 tablet by mouth daily.  CBC and CMP were updated on 06/10/20.  She will be due to update lab work in November and every 3 months to monitor for drug toxicity. Standing orders for CBC and CMP are in place. She was hospitalized with covid in January 2021. She has received both covid-19 vaccine doses and was strongly encouraged to receive the 3rd dose.  She was advised to avoid taking NSAIDs and tylenol 24 hours prior to the 3rd dose.  She was advised to notify us or her PCP if she develops the covid-19 infection in order to receive the monoclonal antibody infusion.  She voiced understanding.  She is aware that she is to hold arava if she develops signs or symptoms of an infection and to resume once the infection has cleared.   She voiced understanding.  She has received the annual influenza vaccination.  Raynaud's phenomenon without gangrene: Not currently active.  No digital ulcerations or signs of gangrene noted.  No skin thickening or tightness noted.   Primary osteoarthritis of left knee: She has good ROM with no discomfort.  No warmth or effusion noted.   H/O total knee replacement, right: Doing well.  She has slightly limited extension and warmth on exam today.  Pseudogout: She has not had any recent flares.  DDD (degenerative disc disease), cervical: She has limited range of motion with lateral rotation.  She has discomfort with  flexion extension of the C-spine.  She follows up with Dr. Lurene Shadow on a regular basis for evaluation and management of chronic pain.  She is currently on a prednisone taper and will be taking her last dose today.  DDD (degenerative disc disease), thoracic: No midline spinal tenderness.  Postural thoracic kyphosis noted.  DDD (degenerative disc disease), lumbar - She has spinal stenosis of the lumbar region with neurogenic claudication.  She is followed by Dr. Lurene Shadow.    Fibromyalgia: She has generalized hyperalgesia and positive tender points on examination today.  She has trapezius muscle tension and muscle tenderness bilaterally.  She takes gabapentin as prescribed.  We discussed the importance of regular exercise and good sleep hygiene.  Other osteoporosis without current pathological fracture: DEXA on 06/07/19 right femoral neck BMD 0.564 with T-score -2.6.  DEXA ordered and monitored by PCP.  She is on Prolia 60 mg sq injections every 6 months.   S/P carpal tunnel release: Performed by Dr. Grandville Silos.  She continues to have some residual discomfort and stiffness in her right hand.  She followed up with Dr. Grandville Silos recently who recommended the use of meloxicam 7.5 mg twice daily for pain relief.  She was encouraged to take meloxicam sparingly for pain relief.  Carpal tunnel syndrome, right upper limb - NCV with EMG on 08/19/2017 moderate right median nerve entrapment at the wrist affecting sensory and motor components.  Carpal tunnel release surgery performed by Dr. Grandville Silos.  Chronic fatigue: Chronic but stable.  We discussed the importance of regular exercise and good sleep hygiene.  Other medical conditions are listed as follows:  History of gastroesophageal reflux (GERD)  History of CHF (congestive heart failure)  IC (interstitial cystitis)  History of hypertension  History of Clostridium difficile colitis  Orders: No orders of the defined types were placed in this  encounter.  No orders of the defined types were placed in this encounter.   Follow-Up Instructions: Return in about 5 months (around 01/07/2021) for Rheumatoid arthritis, Osteoarthritis, Fibromyalgia, Osteoporosis.   [name], PA-C  Note - This record has been created using Dragon software.  Chart creation errors have been sought, but may not always  have been located. Such creation errors do not reflect on  the standard of medical care.

## 2020-07-30 ENCOUNTER — Telehealth: Payer: Self-pay | Admitting: Family Medicine

## 2020-07-30 DIAGNOSIS — M545 Low back pain, unspecified: Secondary | ICD-10-CM | POA: Diagnosis not present

## 2020-07-30 DIAGNOSIS — M542 Cervicalgia: Secondary | ICD-10-CM | POA: Diagnosis not present

## 2020-07-30 DIAGNOSIS — S39012A Strain of muscle, fascia and tendon of lower back, initial encounter: Secondary | ICD-10-CM | POA: Diagnosis not present

## 2020-07-30 DIAGNOSIS — M6281 Muscle weakness (generalized): Secondary | ICD-10-CM | POA: Diagnosis not present

## 2020-07-30 NOTE — Telephone Encounter (Signed)
Pt left paperwork to be filled out, she did not elaborate what this was when asked. She said you knew about it and had filled it out before. She was informed of the 3-5 day turn around.

## 2020-08-01 DIAGNOSIS — M48062 Spinal stenosis, lumbar region with neurogenic claudication: Secondary | ICD-10-CM | POA: Diagnosis not present

## 2020-08-01 NOTE — Telephone Encounter (Signed)
DMV paperwork completed. [name]'s basket.

## 2020-08-05 NOTE — Telephone Encounter (Signed)
Form completed,faxed,confirmation received and scanned into patient's chart. 

## 2020-08-05 NOTE — Telephone Encounter (Signed)
LVM advising pt that her forms have been completed and that I can mail them or place up front for p/u.  Pt did call back and asked that the forms be left up front for p/u.

## 2020-08-06 DIAGNOSIS — G5601 Carpal tunnel syndrome, right upper limb: Secondary | ICD-10-CM | POA: Diagnosis not present

## 2020-08-07 ENCOUNTER — Ambulatory Visit (INDEPENDENT_AMBULATORY_CARE_PROVIDER_SITE_OTHER): Payer: Medicare Other | Admitting: Family Medicine

## 2020-08-07 VITALS — BP 133/55 | HR 58 | Wt 177.0 lb

## 2020-08-07 DIAGNOSIS — D510 Vitamin B12 deficiency anemia due to intrinsic factor deficiency: Secondary | ICD-10-CM

## 2020-08-07 MED ORDER — CYANOCOBALAMIN 1000 MCG/ML IJ SOLN
1000.0000 ug | Freq: Once | INTRAMUSCULAR | Status: AC
  Administered 2020-08-07: 1000 ug via INTRAMUSCULAR

## 2020-08-07 NOTE — Progress Notes (Signed)
   Subjective:    Patient ID: Mia [name], female    DOB: 1951-02-08, 69 y.o.   MRN: [id number]  HPI Patient here for a Vitamin B12 injection.  Denies gastrointestinal problems or dizziness.     Review of Systems     Objective:   Physical Exam        Assessment & Plan:  Patient tolerated injection well without complications.  Patient advised to schedule next injection in 30 days.

## 2020-08-07 NOTE — Progress Notes (Signed)
Agree with documentation as above.   Brileigh Sevcik, MD  

## 2020-08-09 ENCOUNTER — Ambulatory Visit (INDEPENDENT_AMBULATORY_CARE_PROVIDER_SITE_OTHER): Payer: Medicare Other | Admitting: Physician Assistant

## 2020-08-09 ENCOUNTER — Other Ambulatory Visit: Payer: Self-pay

## 2020-08-09 ENCOUNTER — Encounter: Payer: Self-pay | Admitting: Physician Assistant

## 2020-08-09 VITALS — BP 134/76 | HR 71 | Resp 16 | Ht 66.0 in | Wt 179.0 lb

## 2020-08-09 DIAGNOSIS — Z96651 Presence of right artificial knee joint: Secondary | ICD-10-CM

## 2020-08-09 DIAGNOSIS — N3010 Interstitial cystitis (chronic) without hematuria: Secondary | ICD-10-CM

## 2020-08-09 DIAGNOSIS — M1120 Other chondrocalcinosis, unspecified site: Secondary | ICD-10-CM

## 2020-08-09 DIAGNOSIS — Z79899 Other long term (current) drug therapy: Secondary | ICD-10-CM

## 2020-08-09 DIAGNOSIS — M797 Fibromyalgia: Secondary | ICD-10-CM | POA: Diagnosis not present

## 2020-08-09 DIAGNOSIS — M5136 Other intervertebral disc degeneration, lumbar region: Secondary | ICD-10-CM

## 2020-08-09 DIAGNOSIS — M5134 Other intervertebral disc degeneration, thoracic region: Secondary | ICD-10-CM

## 2020-08-09 DIAGNOSIS — M0609 Rheumatoid arthritis without rheumatoid factor, multiple sites: Secondary | ICD-10-CM

## 2020-08-09 DIAGNOSIS — M5030 Other cervical disc degeneration, unspecified cervical region: Secondary | ICD-10-CM | POA: Diagnosis not present

## 2020-08-09 DIAGNOSIS — Z8679 Personal history of other diseases of the circulatory system: Secondary | ICD-10-CM

## 2020-08-09 DIAGNOSIS — Z8619 Personal history of other infectious and parasitic diseases: Secondary | ICD-10-CM

## 2020-08-09 DIAGNOSIS — Z8719 Personal history of other diseases of the digestive system: Secondary | ICD-10-CM

## 2020-08-09 DIAGNOSIS — I7300 Raynaud's syndrome without gangrene: Secondary | ICD-10-CM

## 2020-08-09 DIAGNOSIS — G5601 Carpal tunnel syndrome, right upper limb: Secondary | ICD-10-CM

## 2020-08-09 DIAGNOSIS — M818 Other osteoporosis without current pathological fracture: Secondary | ICD-10-CM | POA: Diagnosis not present

## 2020-08-09 DIAGNOSIS — R5382 Chronic fatigue, unspecified: Secondary | ICD-10-CM

## 2020-08-09 DIAGNOSIS — Z9889 Other specified postprocedural states: Secondary | ICD-10-CM | POA: Diagnosis not present

## 2020-08-09 DIAGNOSIS — M1712 Unilateral primary osteoarthritis, left knee: Secondary | ICD-10-CM | POA: Diagnosis not present

## 2020-08-09 DIAGNOSIS — M51369 Other intervertebral disc degeneration, lumbar region without mention of lumbar back pain or lower extremity pain: Secondary | ICD-10-CM

## 2020-08-09 NOTE — Patient Instructions (Signed)
COVID-19 vaccine recommendations:  ° °COVID-19 vaccine is recommended for everyone (unless you are allergic to a vaccine component), even if you are on a medication that suppresses your immune system.  ° °If you are on Methotrexate, Cellcept (mycophenolate), Rinvoq, Xeljanz, and Olumiant- hold the medication for 1 week after each vaccine. Hold Methotrexate for 2 weeks after the single dose COVID-19 vaccine.  ° °If you are on Orencia subcutaneous injection - hold medication one week prior to and one week after the first COVID-19 vaccine dose (only).  ° °If you are on Orencia IV infusions- time vaccination administration so that the first COVID-19 vaccination will occur four weeks after the infusion and postpone the subsequent infusion by one week.  ° °If you are on Cyclophosphamide or Rituxan infusions please contact your doctor prior to receiving the COVID-19 vaccine.  ° °Do not take Tylenol or any anti-inflammatory medications (NSAIDs) 24 hours prior to the COVID-19 vaccination.  ° °There is no direct evidence about the efficacy of the COVID-19 vaccine in individuals who are on medications that suppress the immune system.  ° °Even if you are fully vaccinated, and you are on any medications that suppress your immune system, please continue to wear a mask, maintain at least six feet social distance and practice hand hygiene.  ° °If you develop a COVID-19 infection, please contact your PCP or our office to determine if you need antibody infusion. ° °The booster vaccine is now available for immunocompromised patients. It is advised that if you had Pfizer vaccine you should get Pfizer booster.  If you had a Moderna vaccine then you should get a Moderna booster. Johnson and Johnson does not have a booster vaccine at this time. ° °Please see the following web sites for updated information.   ° °https://www.[facility]umatology.org/Portals/0/Files/COVID-19-Vaccination-Patient-Resources.pdf ° °https://[email or web address].org/About-Us/Newsroom/Press-Releases/ID/1159 ° °Standing Labs °We placed an order today for your standing lab work.  ° °Please have your standing labs drawn in November and every 3 months  ° °If possible, please have your labs drawn 2 weeks prior to your appointment so that the provider can discuss your results at your appointment. ° °We have open lab daily °Monday through Thursday from 8:30-12:30 PM and 1:30-4:30 PM and Friday from 8:30-12:30 PM and 1:30-4:00 PM °at the office of Dr. Shaili Deveshwar, [facility].   °Please be advised, patients with office appointments requiring lab work will take precedents over walk-in lab work.  °If possible, please come for your lab work on Monday and Friday afternoons, as you may experience shorter wait times. °The office is located at [address] [facility] [address], [address], [place], [place] [address] °No appointment is necessary.   °Labs are drawn by [facility]. Please bring your co-pay at the time of your lab draw.  You may receive a bill from [facility] for your lab work. ° °If you wish to have your labs drawn at another location, please call the office 24 hours in advance to send orders. ° °If you have any questions regarding directions or hours of operation,  °please call [phone].   °As a reminder, please drink plenty of water prior to coming for your lab work. Thanks! ° ° °

## 2020-08-14 NOTE — Telephone Encounter (Signed)
-----   Message from Lanice Schwab sent at 08/14/2020 11:18 AM EDT -----  Regarding: DUMARAN  PLS CL PT HAVING PROBLEMS W BOTH OF HER EYES

## 2020-08-14 NOTE — Telephone Encounter (Signed)
Pt calling asking for appt. Says last night her eyes started itching. Today when she woke up the were crusty and they are red and itching still. Can you do a VV or call something in?

## 2020-08-14 NOTE — Telephone Encounter (Signed)
appt scheduled

## 2020-08-14 NOTE — Telephone Encounter (Signed)
sched VV [id number] thursday

## 2020-08-15 ENCOUNTER — Telehealth: Admit: 2020-08-15 | Discharge: 2020-08-15 | Payer: MEDICARE | Attending: Internal Medicine | Primary: Internal Medicine

## 2020-08-15 ENCOUNTER — Telehealth: Attending: Internal Medicine | Primary: Internal Medicine

## 2020-08-15 DIAGNOSIS — H1033 Unspecified acute conjunctivitis, bilateral: Secondary | ICD-10-CM

## 2020-08-15 MED ORDER — TOBRAMYCIN 0.3 % EYE DROPS
0.3 % | OPHTHALMIC | 0 refills | Status: DC
Start: 2020-08-15 — End: 2021-01-06

## 2020-08-15 NOTE — Progress Notes (Signed)
Laura Roman is a 69 y.o. female who was seen by synchronous (real-time) audio-video technology on 08/15/2020 for Eye Pain        Assessment & Plan:   Diagnoses and all orders for this visit:    1. Acute conjunctivitis of both eyes, unspecified acute conjunctivitis type  -     tobramycin (TOBREX) 0.3 % ophthalmic solution; 1 drop affected eye every 4 hrs        I spent at least 14 minutes on this visit with this established patient.  712  Subjective:       Objective:   No flowsheet data found.   General: alert, cooperative, no distress   Mental  status: normal mood, behavior, speech, dress, motor activity, and thought processes, able to follow commands   HENT: NCAT   Neck: no visualized mass   Resp: no respiratory distress   Neuro: no gross deficits   Skin: no discoloration or lesions of concern on visible areas   Psychiatric: normal affect, consistent with stated mood, no evidence of hallucinations     Additional exam findings:       We discussed the expected course, resolution and complications of the diagnosis(es) in detail.  Medication risks, benefits, costs, interactions, and alternatives were discussed as indicated.  I advised her to contact the office if her condition worsens, changes or fails to improve as anticipated. She expressed understanding with the diagnosis(es) and plan.      Laura Roman, was evaluated through a synchronous (real-time) audio-video encounter. The patient (or guardian if applicable) is aware that this is a billable service. Verbal consent to proceed has been obtained within the past 12 months. The visit was conducted pursuant to the emergency declaration under the [place], [place] waiver authority and the [employer] Act.  Patient identification was verified, and a caregiver was present when appropriate. The patient was located in a state where the provider was credentialed to provide  care.    Albin Felling, MD    She had successful lumpectomy on 08/02/2020.  She saw Dr. Mariea Clonts on 08/13/2020 and then they went out thereafter.  She thinks he got exposed to something then.  Yesterday, she noted onset of thick discolored material coming out of her right greater than left thigh, also some itching and irritation in her conjunctiva are somewhat reddish.  Denied any actual eye pain, vision is okay, no photophobia.She does not feel her allergies are flaring at this point    Past Medical History:   Diagnosis Date   ??? Allergic rhinitis    ??? Anxiety    ??? 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 Compression fx, thoracic spine ([place]) 2007    negative DEXA Dr. Marisa Hua   ??? Diabetes ([place]) 10/2017    on basis of fbs>125; intol metformin   ??? Dyslipidemia    ??? Dyspepsia    ??? Endometrial cancer ([place]) 02/2017    Dr Sheral Flow; stage 1B gr 1 endometroid adenoca w foci squamous diff of the endometrium; RA TLH BSO PLND, 0/13LN, MSI intact   ??? Frozen shoulder     right Dr. Wynonia Hazard MRI   ??? Left thyroid nodule 04/2016    1cm nodule; apparently noted on CT 2008 and unchanged   ??? Multiple lung nodules     no change 10/06, 03/07,  03/08   ??? 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 Osteoarthritis     Dr. Synetta Shadow, Dr. Marisa Hua   ??? Overweight (BMI 25.0-29.9)     IF 7/18 start weight 168 lbs not doing    ??? Painless hematuria 04/2016    Dr Jimmye Norman, neg eval   ??? Palpitations 2008    neg thallium 2008, nl holter 2008, echo nl lv/ef 65%/tr mr/dd/nl pasp   ??? Syncope     neurocardiogenic by tilt 1994   ??? Venous insufficiency    ??? Vitamin D deficiency      Current Outpatient Medications   Medication Sig   ??? tobramycin (TOBREX) 0.3 % ophthalmic solution 1 drop affected eye every 4 hrs   ??? lansoprazole (PREVACID) 30 mg capsule take 1 capsule by mouth BEFORE BREAKFAST DAILY   ??? Januvia 100 mg tablet take 1 tablet by mouth once  daily   ??? ergocalciferol (Vitamin D2) 1,250 mcg (50,000 unit) capsule Take 1 Cap by mouth every month.   ??? multivitamin (ONE A DAY) tablet Take 1 Tab by mouth daily.   ??? citalopram (CELEXA) 10 mg tablet take 1 tablet by mouth once daily   ??? atorvastatin (LIPITOR) 40 mg tablet take 1 tablet by mouth once daily   ??? Blood-Glucose Meter monitoring kit Use daily as directed   ??? glucose blood VI test strips (BLOOD GLUCOSE TEST) strip Use daily as directed   ??? lancets misc Use daily as directed   ??? omega-3 fatty acids (FISH OIL) cap Take 2 Tabs by mouth daily.   ??? aspirin 81 mg chewable tablet Take 81 mg by mouth daily.     No current facility-administered medications for this visit.     Allergies   Allergen Reactions   ??? Fish Oil Nausea Only     Patient denies   ??? Metformin Diarrhea   ??? Relafen [Nabumetone] Nausea Only     Assessment and plan:  1.  Probable conjunctivitis.  Tobramycin eyedrops given, avoidance measures emphasized.  Call if worsening symptoms          Above conditions discussed at length and patient vocalized understanding.  All questions answered to patient satisfaction

## 2020-08-27 NOTE — Progress Notes (Signed)
Formatting of this note is different from the original.  [name]    Laura Roman          12/26/1950   69yo.                                 ________________________________________________________________    Laura Roman is a 69 y.o. 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 She had negative genetic testing. She has had AC x 3 then stopped due to severe side effects then paclitaxol done 9//2021. The patient reports no new masses, nipple discharge or skin changes.  She has no family history of breast cancer. She has no family history of ovarian cancer.  Her first menses was at age 12 .Nulliparous. Very littlehormone replacement therapy.  She has amodifying factor of a biopsy with clip placement and neoadjuvant chemotherapy.     08/13/2020- PT returns post op from PM and SLNB and removal of sebaceous cyst. 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 Only wore ace wrap 3 days so some swelling under left breast.     08/29/2020- PT returns post op. Healing well. Seroma resolving. Seeing Rad onc Friday.     PAST HISTORY:    Past Medical History:   Diagnosis Date   ? Arthropathy    ? Depression     pt denies    ? DM (diabetes mellitus) ([place])    ? Endometrial cancer ([place])    ? Esophageal reflux    ? Exercise tolerance finding 07/31/2020    up 2 flights of stairs w/o CP, mild SOB r/t post-chemo symptoms   ? High cholesterol    ? Malignant neoplasm of lower-outer quadrant of left breast of female, estrogen receptor negative ([place]) 02/2020   ? Paroxysmal tachycardia ([place])     r/t chemo   ? PONV (postoperative nausea and vomiting)     prior to 2018   ? Shortness of breath     mild, r/t chemo       Past Surgical History:   Procedure Laterality Date   ? HX LYMPH NODE DISSECTION N/A 03/11/2017    Procedure: DAVINCI PELVIC LYMPH NODE DISSECTION;  Surgeon: Drucilla Schmidt,  MD   ? HYSTERECTOMY     ? HYSTERECTOMY TOTAL LAPAROSCOPIC Bilateral 03/11/2017    Procedure: DAVINCI LAPAROSCOPIC HYSTERECTOMY TOTAL; SALPINGO-OOPHORECTOMY BILATERAL;  Surgeon: Drucilla Schmidt, MD   ? LEEP PROCEDURE  2004   ? MASTECTOMY PARTIAL/ LUMPECTOMY Left 08/02/2020    Procedure: MASTECTOMY, PARTIAL, AFTER NEEDLE LOCALIZATION;  Surgeon: Mamie Nick, MD   ? ROBOTIC/ DAVINCI ASSISTED PROCEDURE N/A 03/11/2017    Procedure: ROBOT ASSISTED PROCEDURE DAVINCI "XI";  Surgeon: Drucilla Schmidt, MD   ? TRANSURETHRAL RESECTION OF BLADDER TUMOR N/A 07/10/2016    Procedure: CYSTOURETHROSCOPY WITH BIOPSY OF BLADDER;  Surgeon: Renford Dills, MD   ? [place]   ? VENOUS ACCESS CATHETER INSERTION  02/2020    Mediport       Home Medication List - Marked as Reviewed on 08/28/20 1212   Medication Sig   atorvastatin (LIPITOR) 40 mg PO TABS Every Night at Bedtime.   citalopram (CELEXA) 20 mg PO TABS Take 10 mg by Mouth Every Night at Bedtime.   lansoprazole (PREVACID)  30 mg PO CPDR take 1 capsule by mouth once daily BEFORE BREAKFAST   SITagliptin (JANUVIA) 100 mg PO TABS Take 100 mg by Mouth Every Night at Bedtime.   therapeutic multivitamin PO TABS Take 1 Tab by Mouth Once a Day.       Allergies   Allergen Reactions   ? Lidocaine other/intolerance     anxious   ? Metformin gi distress       Family History   Problem Relation Age of Onset   ? Anesthesia Reaction Sister         PONV       Social History     Socioeconomic History   ? Marital status: Married     Spouse name: Not on file   ? Number of children: Not on file   ? Years of education: Not on file   ? Highest education level: Not on file   Occupational History   ? Not on file   Tobacco Use   ? Smoking status: Former Smoker     Quit date: 10/26/1978     Years since quitting: 41.8   ? Smokeless tobacco: Never Used   ? Tobacco comment: quit many YRS AGO   Vaping Use   ? Vaping Use: Never used   Substance and Sexual Activity   ? Alcohol use: Not Currently      Alcohol/week: 2.5 standard drinks     Types: 3 Glasses of wine per week     Comment: no alcohol since 01/2020   ? Drug use: No   ? Sexual activity: Not on file   Other Topics Concern   ? Not on file   Social History Narrative   ? Not on file     Social Determinants of Health     Financial Resource Strain:    ? Difficulty of Paying Living Expenses: Not on file   Food Insecurity:    ? Worried About Running Out of Food in the Last Year: Not on file   ? Ran Out of Food in the Last Year: Not on file   Transportation Needs:    ? Lack of Transportation (Medical): Not on file   ? Lack of Transportation (Non-Medical): Not on file   Physical Activity:    ? Days of Exercise per Week: Not on file   ? Minutes of Exercise per Session: Not on file   Stress:    ? Feeling of Stress : Not on file   Social Connections:    ? Frequency of Communication with Friends and Family: Not on file   ? Frequency of Social Gatherings with Friends and Family: Not on file   ? Attends Religious Services: Not on file   ? Active Member of Clubs or Organizations: Not on file   ? Attends Archivist Meetings: Not on file   ? Marital Status: Not on file   Intimate Partner Violence:    ? Fear of Current or Ex-Partner: Not on file   ? Emotionally Abused: Not on file   ? Physically Abused: Not on file   ? Sexually Abused: Not on file       PHYSICAL EXAM:    Ht 5\' 4"  (1.626 m)   Wt 68 kg (150 lb)   BMI 25.75 kg/m     General Appearance:  No  acute distress, well nourished  Mental Status:  Oriented x 3-Person, Place and Time. Normal mood and affect    Breast:  Improved seroma and improved moisture under left breast.    Mammogram: Reviewed from PACS -   02/09/2020 - 1.7cm mass 4:00 5cm fn on [place] as there was a developing mass lower posterior left breast. [place] on right stable cysts.         Assessment & Plan:    This is a 69 year old female who is being seen for  breast cancer of the left lower outer side.  We began by reviewing her pathology report  in detail.  She does understand that she had IDC Triple negative type of breast cancer. She has completed neoadjuvant chemotherapy with Dr Sula Rumple with 3 AC then paclitaxol. 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 Will need radiation at [facility]. F/u with me in at time of bilateral diagnostic mammogram same day exam April 2022.       cT1cN0M0 triple negative.     Electronically signed by Clearnce Sorrel. Reed MD 08/29/2020, 8:26 AM    Electronically signed by Mamie Nick, MD at 08/29/2020  8:29 AM EDT

## 2020-08-28 ENCOUNTER — Ambulatory Visit (INDEPENDENT_AMBULATORY_CARE_PROVIDER_SITE_OTHER): Payer: Medicare Other | Admitting: Adult Health

## 2020-08-28 ENCOUNTER — Encounter: Payer: Self-pay | Admitting: Adult Health

## 2020-08-28 ENCOUNTER — Telehealth: Payer: Self-pay | Admitting: Adult Health

## 2020-08-28 ENCOUNTER — Other Ambulatory Visit: Payer: Self-pay | Admitting: Cardiovascular Disease

## 2020-08-28 VITALS — BP 128/82 | Ht 66.0 in | Wt 176.4 lb

## 2020-08-28 DIAGNOSIS — R413 Other amnesia: Secondary | ICD-10-CM | POA: Diagnosis not present

## 2020-08-28 DIAGNOSIS — M797 Fibromyalgia: Secondary | ICD-10-CM | POA: Diagnosis not present

## 2020-08-28 DIAGNOSIS — G250 Essential tremor: Secondary | ICD-10-CM

## 2020-08-28 MED ORDER — ATORVASTATIN 40 MG TAB
40 mg | ORAL_TABLET | ORAL | 3 refills | Status: DC
Start: 2020-08-28 — End: 2021-08-18

## 2020-08-28 NOTE — Progress Notes (Signed)
PATIENT: Shatana Saxton Helle DOB: 09-12-1951  REASON FOR VISIT: follow up HISTORY FROM: patient  HISTORY OF PRESENT ILLNESS: Today 08/28/20:  Ms. Nucci is a 69 year old female with a history of memory disturbance, tremor and fibromyalgia.  In regards to her memory she feels that it has gotten worse since having Covid in January.  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  She is able to complete all ADLs independently.  She has noticed some change in her balance.  States that she recently had a fall and injured her elbow.  Feels that her tremor has been well controlled with Mysoline.  Primarily just affects the hands  Reports that fibromyalgia continues to be fairly controlled with Effexor.  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  She returns today for an evaluation.  HISTORY Ms. Nigg is a 69 year old right-handed white female with a history of a mild memory disturbance, she is on Aricept and Namenda.  She contracted the Covid virus and required hospitalization on 06 November 2019.  The patient has had some increase in fatigue and some increased cognitive slowing since that time.  She has had ongoing pain in the neck and low back, she is followed through an orthopedic surgeon.  She had a recent MRI of the cervical and lumbar spine done through [facility] in [place].  The results of the studies are not available to me.  The patient does report some gait instability, she has not had any recent falls.  She does not use a cane.  She returns the office today for an evaluation.  She is on very low-dose Mysoline taking 50 mg at night.  REVIEW OF SYSTEMS: Out of a complete 14 system review of symptoms, the patient complains only of the following symptoms, and all other reviewed systems are negative.  See HPI  ALLERGIES: Allergies  Allergen Reactions  . Codeine Nausea And Vomiting  . Myrbetriq  [Mirabegron] Other  (See Comments)    SEVERE HEADACHE  . Percocet [Oxycodone-Acetaminophen] Nausea And Vomiting  . Propoxyphene Nausea And Vomiting  . Nystatin   . Celebrex [Celecoxib] Other (See Comments)    Other reaction(s): Other flushed  . Darvocet [Propoxyphene N-Acetaminophen] Nausea And Vomiting  . Erythromycin Nausea And Vomiting    Nausea and vomiting   . Hydrocodone Nausea And Vomiting  . Lyrica [Pregabalin] Swelling    Legs swelling   . Macrodantin [Nitrofurantoin] Nausea And Vomiting  . Toradol [Ketorolac Tromethamine] Nausea And Vomiting    Per patient, only PO form causes nausea and vomiting. Can take injection without issue  . Tramadol Nausea And Vomiting  . Verapamil Nausea And Vomiting    REACTION: intolerance    HOME MEDICATIONS: Outpatient Medications Prior to Visit  Medication Sig Dispense Refill  . amLODipine (NORVASC) 5 MG tablet Take 1 tablet (5 mg total) by mouth daily. 30 tablet 0  . amLODipine (NORVASC) 5 MG tablet Take 1 tablet (5 mg total) by mouth daily. (Patient not taking: Reported on 08/09/2020) 90 tablet 3  . atorvastatin (LIPITOR) 40 MG tablet TAKE 1 TABLET EVERY DAY (Patient not taking: Reported on 08/09/2020) 90 tablet 3  . blood glucose meter kit and supplies Dispense based on patient and insurance.  Check fasting blood sugar in the morning and as needed DX: R73.03 (Patient not taking: Reported on 08/09/2020) 1 each 0  . colestipol (COLESTID) 1 g tablet     .  Cyanocobalamin (VITAMIN B-12 IJ) Inject 1 mL as directed every 30 (thirty) days.     Marland Kitchen denosumab (PROLIA) 60 MG/ML SOLN injection Inject 60 mg into the skin every 6 (six) months. Administer in upper arm, thigh, or abdomen    . donepezil (ARICEPT) 10 MG tablet TAKE 1 TABLET AT BEDTIME 90 tablet 3  . fluticasone (FLONASE) 50 MCG/ACT nasal spray Place 2 sprays into both nostrils daily. 16 g 6  . gabapentin (NEURONTIN) 300 MG capsule TAKE 1 TO 2 CAPSULES BY MOUTH UP TO TWO TIMES DAILY AS NEEDED 180 capsule 3   . leflunomide (ARAVA) 20 MG tablet Take 1 tablet (20 mg total) by mouth daily. 30 tablet 0  . losartan (COZAAR) 50 MG tablet Take 2 tablets (100 mg total) by mouth daily. Take 1 tablet in the morning and 1 tablet in evening as needed for blood pressure greater than 150 60 tablet 0  . Melatonin 10 MG CAPS Take 10 capsules by mouth daily.    . memantine (NAMENDA) 10 MG tablet TAKE 1 TABLET TWICE DAILY 180 tablet 4  . metFORMIN (GLUCOPHAGE-XR) 500 MG 24 hr tablet Take 500 mg by mouth daily with breakfast.    . metoprolol tartrate (LOPRESSOR) 100 MG tablet TAKE 1 AND 1/2 TABLETS TWICE DAILY 270 tablet 3  . ondansetron (ZOFRAN) 4 MG tablet Take 1 tablet (4 mg total) by mouth daily as needed for nausea or vomiting. (Patient not taking: Reported on 08/09/2020) 30 tablet 1  . ondansetron (ZOFRAN-ODT) 8 MG disintegrating tablet Take 1 tablet by mouth as needed for nausea.    . pantoprazole (PROTONIX) 40 MG tablet Take 40 mg by mouth 2 (two) times daily.    . polyethylene glycol powder (MIRALAX) powder Take 17 g by mouth daily as needed for mild constipation or moderate constipation.     . primidone (MYSOLINE) 50 MG tablet TAKE 1 TABLET AT BEDTIME 90 tablet 1  . venlafaxine XR (EFFEXOR-XR) 75 MG 24 hr capsule TAKE 1 CAPSULE EVERY DAY WITH BREAKFAST 90 capsule 3   No facility-administered medications prior to visit.    PAST MEDICAL HISTORY: Past Medical History:  Diagnosis Date  . Anal fissure   . Anemia   . Arthritis   . Blood transfusion   . C. difficile colitis   . Chest pain    a. 01/2013 MV: EF 59%, no ischemia.  . Chronic Dyspnea    a. 01/2013 Echo: EF 60-65%, Gr 1 DD, PASP 79mHg.// Echo 01/2020: EF 60-65, no RWMA, GR 1 DD, GLS -21.6%, normal RV SF, trivial MR   . COVID-19   . DDD (degenerative disc disease), cervical   . DDD (degenerative disc disease), lumbar   . Diabetes mellitus   . Fibromyalgia   . Gall stones   . GERD (gastroesophageal reflux disease)    gastritis  . Hemorrhoids    . Hypertension   . Interstitial cystitis   . MCI (mild cognitive impairment) 11/25/2017  . Memory difficulty 12/14/2013  . Neuromuscular disorder [place]    sclerosis  . Osteoporosis   . Peptic ulcer   . PONV (postoperative nausea and vomiting)   . Pseudogout   . Raynaud phenomenon   . Rectal bleeding   . Sleep apnea    a. on cpap.  .Marland KitchenSVT (supraventricular tachycardia) ([place]   . Syncope 09/10/2015  . Thyroid disease    hypothyroidism  . Tremor, essential 05/14/2016    PAST SURGICAL HISTORY: Past Surgical History:  Procedure Laterality Date  .  APPENDECTOMY    . BLADDER SURGERY     x2  . BLADDER SURGERY    . BREAST BIOPSY    . CARDIAC CATHETERIZATION    . CARPAL TUNNEL RELEASE    . CATARACT EXTRACTION Bilateral   . CHOLECYSTECTOMY    . DILATION AND CURETTAGE OF UTERUS    . ENTEROCELE REPAIR     x2  . EYE SURGERY     retina  . hysterectomy - unknown type    . JOINT REPLACEMENT    . KNEE ARTHROPLASTY    . KNEE SURGERY     x6  . NECK SURGERY     fusion  . OOPHORECTOMY    . QUADRICEPS REPAIR Right   . RECTOCELE REPAIR     x2  . SHOULDER ARTHROSCOPY WITH SUBACROMIAL DECOMPRESSION, ROTATOR CUFF REPAIR AND BICEP TENDON REPAIR  10/06/2012   Procedure: SHOULDER ARTHROSCOPY WITH SUBACROMIAL DECOMPRESSION, ROTATOR CUFF REPAIR AND BICEP TENDON REPAIR;  Surgeon: Nita Sells, MD;  Location: [facility];  Service: Orthopedics;  Laterality: Right;  Arthroscopic  Repair  of  Subscapularis, Open Biceps Tenodesis  . SHOULDER SURGERY     bilateral- bones spur  . TONSILLECTOMY    . TOTAL KNEE ARTHROPLASTY    . TOTAL SHOULDER ARTHROPLASTY      FAMILY HISTORY: Family History  Problem Relation Age of Onset  . Heart attack Mother   . Stroke Mother   . Diabetes Mother   . Heart disease Mother   . Colon polyps Mother   . Asthma Mother   . Breast cancer Maternal Grandmother   . Wilson's disease Maternal Grandmother   . Pancreatic cancer Maternal  Grandfather   . Heart disease Father   . Heart attack Father   . Asthma Father   . Stroke Sister   . Hypertension Sister   . Rheum arthritis Sister   . Dementia Sister   . Asthma Sister   . Lupus Sister   . Asthma Sister     SOCIAL HISTORY: Social History   Socioeconomic History  . Marital status: Married    Spouse name: Not on file  . Number of children: 2  . Years of education: 47  . Highest education level: Not on file  Occupational History  . Occupation: Retired  Tobacco Use  . Smoking status: Never Smoker  . Smokeless tobacco: Never Used  Vaping Use  . Vaping Use: Never used  Substance and Sexual Activity  . Alcohol use: No    Alcohol/week: 0.0 standard drinks  . Drug use: No  . Sexual activity: Not on file  Other Topics Concern  . Not on file  Social History Narrative   Patient drinks about 3-4 cups of caffeine daily.   Patient is right handed.   Social Determinants of Health   Financial Resource Strain:   . Difficulty of Paying Living Expenses: Not on file  Food Insecurity:   . Worried About Charity fundraiser in the Last Year: Not on file  . Ran Out of Food in the Last Year: Not on file  Transportation Needs:   . Lack of Transportation (Medical): Not on file  . Lack of Transportation (Non-Medical): Not on file  Physical Activity:   . Days of Exercise per Week: Not on file  . Minutes of Exercise per Session: Not on file  Stress:   . Feeling of Stress : Not on file  Social Connections:   . Frequency of Communication with Friends  and Family: Not on file  . Frequency of Social Gatherings with Friends and Family: Not on file  . Attends Religious Services: Not on file  . Active Member of Clubs or Organizations: Not on file  . Attends Archivist Meetings: Not on file  . Marital Status: Not on file  Intimate Partner Violence:   . Fear of Current or Ex-Partner: Not on file  . Emotionally Abused: Not on file  . Physically Abused: Not on file   . Sexually Abused: Not on file      PHYSICAL EXAM  Vitals:   08/28/20 1026  Weight: 176 lb 6.4 oz (80 kg)  Height: _0  (1.676 m)   Body mass index is 28.47 kg/m.   MMSE - Mini Mental State Exam 08/28/2020 12/27/2019 06/20/2019  Orientation to time _1 Orientation to Place _2 Registration _3 Attention/ Calculation _4 Recall _5 Language- name 2 objects _6 Language- repeat _7 Language- follow 3 step command _8 Language- read & follow direction _9 Write a sentence _10 Copy design _11 Copy design-comments - - named 9 animals  Total score _12 Generalized: Well developed, in no acute distress   Neurological examination  Mentation: Alert oriented to time, place, history taking. Follows all commands speech and language fluent Cranial nerve II-XII: Pupils were equal round reactive to light. Extraocular movements were full, visual field were full on confrontational test. Facial sensation and strength were normal. Uvula tongue midline. Head turning and shoulder shrug  were normal and symmetric. Motor: The motor testing reveals 5 over 5 strength of all 4 extremities-giveaway weakness noted in all 4 extremities.  Good symmetric motor tone is noted throughout.  Sensory: Sensory testing is intact to soft touch on all 4 extremities. No evidence of extinction is noted.  Coordination: Cerebellar testing reveals good finger-nose-finger and heel-to-shin bilaterally.  Gait and station: Gait is normal.  Patient began staggering before putting her feet together.  She is unable to perform Romberg.  Tandem gait not attempted.   Reflexes: Deep tendon reflexes are symmetric and normal bilaterally.   DIAGNOSTIC DATA (LABS, IMAGING, TESTING) - I reviewed patient records, labs, notes, testing and imaging myself where available.  Lab Results  Component Value Date   WBC 5.0 06/10/2020   HGB 11.6 (L) 06/10/2020   HCT 36.9 06/10/2020   MCV 87.6  06/10/2020   PLT 384 06/10/2020      Component Value Date/Time   NA 137 06/10/2020 1200   NA 138 01/23/2020 1602   K 4.2 06/10/2020 1200   CL 102 06/10/2020 1200   CO2 28 06/10/2020 1200   GLUCOSE 130 (H) 06/10/2020 1200   BUN 8 06/10/2020 1200   BUN 9 01/23/2020 1602   CREATININE 0.84 06/10/2020 1200   CALCIUM 9.8 06/10/2020 1200   PROT 6.4 06/10/2020 1200   PROT 5.9 (L) 06/07/2019 0934   ALBUMIN 4.0 05/21/2020 0819   ALBUMIN 3.9 06/07/2019 0934   AST 31 06/10/2020 1200   ALT 27 06/10/2020 1200   ALKPHOS 73 05/21/2020 0819   BILITOT 0.4 06/10/2020 1200   BILITOT 0.3 06/07/2019 0934   GFRNONAA 71 06/10/2020 1200   GFRAA 83 06/10/2020 1200   Lab Results  Component Value Date   CHOL 128 06/07/2019   HDL 52 06/07/2019  LDLCALC 51 06/07/2019   TRIG 97 11/07/2019   CHOLHDL 2.5 06/07/2019   Lab Results  Component Value Date   HGBA1C 6.0 (A) 06/11/2020   Lab Results  Component Value Date   [id number] 818 10/03/2019   Lab Results  Component Value Date   TSH 1.34 05/21/2020      ASSESSMENT AND PLAN 69 y.o. year old female  has a past medical history of Anal fissure, Anemia, Arthritis, Blood transfusion, C. difficile colitis, Chest pain, Chronic Dyspnea, COVID-19, DDD (degenerative disc disease), cervical, DDD (degenerative disc disease), lumbar, Diabetes mellitus, Fibromyalgia, Gall stones, GERD (gastroesophageal reflux disease), Hemorrhoids, Hypertension, Interstitial cystitis, MCI (mild cognitive impairment) (11/25/2017), Memory difficulty (12/14/2013), Neuromuscular disorder ([place]), Osteoporosis, Peptic ulcer, PONV (postoperative nausea and vomiting), Pseudogout, Raynaud phenomenon, Rectal bleeding, Sleep apnea, SVT (supraventricular tachycardia) ([place]), Syncope (09/10/2015), Thyroid disease, and Tremor, essential (05/14/2016). here with :  1. Tremor  - Continue Mysoline 50 mg daily at bedtime  2.  Fibromyalgia   -Continue Effexor 75 mg daily  3.  Memory  disturbance  - MMSE 24/30-declined since last visit -MRI of the brain ordered -Referral for neuropsychological evaluation -Continue Aricept 10 mg at bedtime and Namenda 10 mg twice a day   Follow-up in 6 months or sooner if needed  I spent 30 minutes of face-to-face and non-face-to-face time with patient.  This included previsit chart review, lab review, study review, order entry, electronic health record documentation, patient education.  Heather Givens, MSN, NP-C 08/28/2020, 10:27 AM [facility] [address] [place], [place] [address] [phone]

## 2020-08-28 NOTE — Progress Notes (Signed)
I have read the note, and I agree with the clinical assessment and plan.  Dareion Kneece K Tandy Lewin   

## 2020-08-28 NOTE — Patient Instructions (Addendum)
Your Plan:  1. Tremor  - Continue Mysoline 50 mg daily at bedtime  2.  Fibromyalgia   -Continue Effexor 75 mg daily  3.  Memory disturbance  - MMSE 24/30- declined - MRI brain - referral for neuropsychological eval for memory -Continue Aricept 10 mg at bedtime and Namenda 10 mg twice a day     Thank you for coming to see [place] at [facility]. I hope we have been able to provide you high quality care today.  You may receive a patient satisfaction survey over the next few weeks. We would appreciate your feedback and comments so that we may continue to improve ourselves and the health of our patients.

## 2020-08-28 NOTE — Telephone Encounter (Signed)
Medicare/bcbs supp order sent to GI. No auth they will reach out to the patient to schedule.  

## 2020-08-29 ENCOUNTER — Encounter: Payer: Self-pay | Admitting: Psychology

## 2020-08-29 DIAGNOSIS — M48062 Spinal stenosis, lumbar region with neurogenic claudication: Secondary | ICD-10-CM | POA: Diagnosis not present

## 2020-08-29 DIAGNOSIS — M47812 Spondylosis without myelopathy or radiculopathy, cervical region: Secondary | ICD-10-CM | POA: Diagnosis not present

## 2020-08-30 ENCOUNTER — Inpatient Hospital Stay: Admit: 2020-08-30 | Payer: MEDICARE | Primary: Internal Medicine

## 2020-08-30 DIAGNOSIS — C50512 Malignant neoplasm of lower-outer quadrant of left female breast: Secondary | ICD-10-CM

## 2020-09-01 ENCOUNTER — Ambulatory Visit
Admission: RE | Admit: 2020-09-01 | Discharge: 2020-09-01 | Disposition: A | Discharge status: Home / Self Care | Payer: Medicare Other | Source: Ambulatory Visit | Attending: Adult Health | Admitting: Adult Health

## 2020-09-01 ENCOUNTER — Other Ambulatory Visit: Payer: Self-pay

## 2020-09-01 DIAGNOSIS — R413 Other amnesia: Secondary | ICD-10-CM | POA: Diagnosis not present

## 2020-09-01 MED ORDER — GADOBENATE DIMEGLUMINE 529 MG/ML IV SOLN
15.0000 mL | Freq: Once | INTRAVENOUS | Status: AC | PRN
  Administered 2020-09-01: 15 mL via INTRAVENOUS

## 2020-09-05 ENCOUNTER — Other Ambulatory Visit: Payer: Medicare Other

## 2020-09-05 ENCOUNTER — Inpatient Hospital Stay: Admit: 2020-09-05 | Payer: MEDICARE | Primary: Internal Medicine

## 2020-09-05 DIAGNOSIS — N3020 Other chronic cystitis without hematuria: Secondary | ICD-10-CM | POA: Diagnosis not present

## 2020-09-05 DIAGNOSIS — R8279 Other abnormal findings on microbiological examination of urine: Secondary | ICD-10-CM | POA: Diagnosis not present

## 2020-09-06 ENCOUNTER — Inpatient Hospital Stay: Admit: 2020-09-06 | Payer: MEDICARE | Primary: Internal Medicine

## 2020-09-09 ENCOUNTER — Other Ambulatory Visit: Payer: Self-pay | Admitting: *Deleted

## 2020-09-09 DIAGNOSIS — Z79899 Other long term (current) drug therapy: Secondary | ICD-10-CM

## 2020-09-10 LAB — CBC WITH DIFFERENTIAL/PLATELET
Absolute Monocytes: 873 cells/uL (ref 200–950)
Basophils Absolute: 89 cells/uL (ref 0–200)
Basophils Relative: 1.5 %
Eosinophils Absolute: 89 cells/uL (ref 15–500)
Eosinophils Relative: 1.5 %
HCT: 35.3 % (ref 35.0–45.0)
Hemoglobin: 11.3 g/dL — ABNORMAL LOW (ref 11.7–15.5)
Lymphs Abs: 1800 cells/uL (ref 850–3900)
MCH: 27.8 pg (ref 27.0–33.0)
MCHC: 32 g/dL (ref 32.0–36.0)
MCV: 86.9 fL (ref 80.0–100.0)
MPV: 11.1 fL (ref 7.5–12.5)
Monocytes Relative: 14.8 %
Neutro Abs: 3050 cells/uL (ref 1500–7800)
Neutrophils Relative %: 51.7 %
Platelets: 400 10*3/uL (ref 140–400)
RBC: 4.06 10*6/uL (ref 3.80–5.10)
RDW: 14.3 % (ref 11.0–15.0)
Total Lymphocyte: 30.5 %
WBC: 5.9 10*3/uL (ref 3.8–10.8)

## 2020-09-10 LAB — COMPLETE METABOLIC PANEL WITH GFR
AG Ratio: 1.7 (calc) (ref 1.0–2.5)
ALT: 20 U/L (ref 6–29)
AST: 22 U/L (ref 10–35)
Albumin: 4 g/dL (ref 3.6–5.1)
Alkaline phosphatase (APISO): 104 U/L (ref 37–153)
BUN: 9 mg/dL (ref 7–25)
CO2: 27 mmol/L (ref 20–32)
Calcium: 9.8 mg/dL (ref 8.6–10.4)
Chloride: 98 mmol/L (ref 98–110)
Creat: 0.8 mg/dL (ref 0.50–0.99)
GFR, Est African American: 87 mL/min/{1.73_m2} (ref >=60)
GFR, Est Non African American: 75 mL/min/{1.73_m2} (ref >=60)
Globulin: 2.3 g/dL (calc) (ref 1.9–3.7)
Glucose, Bld: 107 mg/dL (ref 65–139)
Potassium: 4.4 mmol/L (ref 3.5–5.3)
Sodium: 133 mmol/L — ABNORMAL LOW (ref 135–146)
Total Bilirubin: 0.3 mg/dL (ref 0.2–1.2)
Total Protein: 6.3 g/dL (ref 6.1–8.1)

## 2020-09-11 ENCOUNTER — Ambulatory Visit (INDEPENDENT_AMBULATORY_CARE_PROVIDER_SITE_OTHER): Payer: Medicare Other | Admitting: Family Medicine

## 2020-09-11 ENCOUNTER — Encounter: Payer: Self-pay | Admitting: Family Medicine

## 2020-09-11 ENCOUNTER — Ambulatory Visit: Payer: Medicare Other

## 2020-09-11 ENCOUNTER — Other Ambulatory Visit: Payer: Self-pay

## 2020-09-11 ENCOUNTER — Telehealth: Payer: Self-pay | Admitting: Family Medicine

## 2020-09-11 VITALS — BP 124/56 | HR 57 | Ht 66.0 in | Wt 176.0 lb

## 2020-09-11 DIAGNOSIS — L720 Epidermal cyst: Secondary | ICD-10-CM | POA: Diagnosis not present

## 2020-09-11 DIAGNOSIS — M810 Age-related osteoporosis without current pathological fracture: Secondary | ICD-10-CM

## 2020-09-11 DIAGNOSIS — M818 Other osteoporosis without current pathological fracture: Secondary | ICD-10-CM | POA: Diagnosis not present

## 2020-09-11 DIAGNOSIS — D510 Vitamin B12 deficiency anemia due to intrinsic factor deficiency: Secondary | ICD-10-CM | POA: Diagnosis not present

## 2020-09-11 DIAGNOSIS — G3184 Mild cognitive impairment, so stated: Secondary | ICD-10-CM | POA: Diagnosis not present

## 2020-09-11 MED ORDER — CYANOCOBALAMIN 1000 MCG/ML IJ SOLN
1000.0000 ug | Freq: Once | INTRAMUSCULAR | Status: AC
  Administered 2020-09-11: 1000 ug via INTRAMUSCULAR

## 2020-09-11 MED ORDER — DENOSUMAB 60 MG/ML ~~LOC~~ SOSY
60.0000 mg | PREFILLED_SYRINGE | Freq: Once | SUBCUTANEOUS | Status: AC
  Administered 2020-09-11: 60 mg via SUBCUTANEOUS

## 2020-09-11 NOTE — Assessment & Plan Note (Signed)
Given Prolia today.  Repeat in 6 months.  Continue taking adequate calcium with vitamin D supplementation.

## 2020-09-11 NOTE — Telephone Encounter (Signed)
Please call patient and remind her she needs to be taking adequate calcium with vitamin D for her bone health.  I did not see it on her medication list but we may just be missing it.  To just make sure that she is doing that.  Also want a get a up-to-date B12 so right before her next B12 in December I would like for her to go for labs so that we can make sure that her baseline looks good.  It will have been a year in December since the last time we checked it.

## 2020-09-11 NOTE — Patient Instructions (Addendum)
Follow up in 6 months for IFG and Prolia injection done.

## 2020-09-11 NOTE — Telephone Encounter (Signed)
Spoke w/pt and given recommendations for calcium and vit D supplementation. Also told to go before getting next B12 injection to have labs done.

## 2020-09-11 NOTE — Progress Notes (Signed)
Pt came in today for B12 injection. Injection  tolerated well. Given in [place] Pt reports no negative side effects from medication. Denies any dizziness, chest pain or palpitations, and no GI problems.. RTC in 4 weeks for next injection.

## 2020-09-11 NOTE — Assessment & Plan Note (Signed)
Will undergo neuropsychiatric examination in December.  Continue with her Aricept and Namenda for now.  Neurology is managing this condition.

## 2020-09-11 NOTE — Progress Notes (Signed)
Established Patient Office Visit  Subjective:  Patient ID: EMRI SAMPLE, female    DOB: 1951/06/07  Age: 69 y.o. MRN: [id number]  CC:  Chief Complaint  Patient presents with  . Hypertension  . prolia injection    HPI Mia [name] presents for Prolia injection for osteoporosis.  She is doing well with the injections this is her 22-month 1.  Labs are up-to-date including calcium and creatinine.  She is also here for her monthly B12 injection.  She also wanted to let me know that she recently had blood work done.  Dr. Inocente Salles liver, her endocrinologist has been managing her hyponatremia they stopped her diuretic and had her limit her fluids that she was taking in a fairly large amount of fluids.  The sodium corrected nicely but more recently the sodium has dropped a little bit.  Not as low as it was previously.  Is also been having a persistent headache for the last week or so it is little worse on the right side of her head compared to the left and around her eye.  He also wanted to let me know that she has had some significant change in her memory particularly over the last 9 months she said ever since having had Covid in January and being hospitalized she struggling more with her memory and having balance issues.  She had a significant decline on her memory testing.  She is already on Aricept and Namenda.  So they are getting her referred for neuropsychiatric testing to count of narrow down what might be going on she does have a family history of Alzheimer's dementia in her mother.  She also recently had a brain MRI on November 7.  Had some mild atrophy typical for age but slightly progressed compared to 2016.  Normal enhancement pattern but no acute findings.  Reports for the last 3 days she has had a little cyst in the groin area that she would like me to look at she says really not bothersome is only painful if she just sits a certain way and puts pressure on the area.   She reports that  her interstitial cystitis also recently started flaring up so she went to see her urologist.  She was also found to have an active UTI so she is currently being treated with that with vancomycin and cephalexin.   Past Medical History:  Diagnosis Date  . Anal fissure   . Anemia   . Arthritis   . Blood transfusion   . C. difficile colitis   . Chest pain    a. 01/2013 MV: EF 59%, no ischemia.  . Chronic Dyspnea    a. 01/2013 Echo: EF 60-65%, Gr 1 DD, PASP 18mmHg.// Echo 01/2020: EF 60-65, no RWMA, GR 1 DD, GLS -21.6%, normal RV SF, trivial MR   . COVID-19   . DDD (degenerative disc disease), cervical   . DDD (degenerative disc disease), lumbar   . Diabetes mellitus   . Fibromyalgia   . Gall stones   . GERD (gastroesophageal reflux disease)    gastritis  . Hemorrhoids   . Hypertension   . Interstitial cystitis   . MCI (mild cognitive impairment) 11/25/2017  . Memory difficulty 12/14/2013  . Neuromuscular disorder ([place])    sclerosis  . Osteoporosis   . Peptic ulcer   . PONV (postoperative nausea and vomiting)   . Pseudogout   . Raynaud phenomenon   . Rectal bleeding   . Sleep apnea  a. on cpap.  Marland Kitchen SVT (supraventricular tachycardia) ([place])   . Syncope 09/10/2015  . Thyroid disease    hypothyroidism  . Tremor, essential 05/14/2016    Past Surgical History:  Procedure Laterality Date  . APPENDECTOMY    . BLADDER SURGERY     x2  . BLADDER SURGERY    . BREAST BIOPSY    . CARDIAC CATHETERIZATION    . CARPAL TUNNEL RELEASE    . CATARACT EXTRACTION Bilateral   . CHOLECYSTECTOMY    . DILATION AND CURETTAGE OF UTERUS    . ENTEROCELE REPAIR     x2  . EYE SURGERY     retina  . hysterectomy - unknown type    . JOINT REPLACEMENT    . KNEE ARTHROPLASTY    . KNEE SURGERY     x6  . NECK SURGERY     fusion  . OOPHORECTOMY    . QUADRICEPS REPAIR Right   . RECTOCELE REPAIR     x2  . SHOULDER ARTHROSCOPY WITH SUBACROMIAL DECOMPRESSION, ROTATOR CUFF REPAIR AND BICEP TENDON  REPAIR  10/06/2012   Procedure: SHOULDER ARTHROSCOPY WITH SUBACROMIAL DECOMPRESSION, ROTATOR CUFF REPAIR AND BICEP TENDON REPAIR;  Surgeon: Nita Sells, MD;  Location: [place];  Service: Orthopedics;  Laterality: Right;  Arthroscopic  Repair  of  Subscapularis, Open Biceps Tenodesis  . SHOULDER SURGERY     bilateral- bones spur  . TONSILLECTOMY    . TOTAL KNEE ARTHROPLASTY    . TOTAL SHOULDER ARTHROPLASTY      Family History  Problem Relation Age of Onset  . Heart attack Mother   . Stroke Mother   . Diabetes Mother   . Heart disease Mother   . Colon polyps Mother   . Asthma Mother   . Breast cancer Maternal Grandmother   . Wilson's disease Maternal Grandmother   . Pancreatic cancer Maternal Grandfather   . Heart disease Father   . Heart attack Father   . Asthma Father   . Stroke Sister   . Hypertension Sister   . Rheum arthritis Sister   . Dementia Sister   . Asthma Sister   . Lupus Sister   . Asthma Sister     Social History   Socioeconomic History  . Marital status: Married    Spouse name: Not on file  . Number of children: 2  . Years of education: 16  . Highest education level: Not on file  Occupational History  . Occupation: Retired  Tobacco Use  . Smoking status: Never Smoker  . Smokeless tobacco: Never Used  Vaping Use  . Vaping Use: Never used  Substance and Sexual Activity  . Alcohol use: No    Alcohol/week: 0.0 standard drinks  . Drug use: No  . Sexual activity: Not on file  Other Topics Concern  . Not on file  Social History Narrative   Patient drinks about 3-4 cups of caffeine daily.   Patient is right handed.   Social Determinants of Health   Financial Resource Strain:   . Difficulty of Paying Living Expenses: Not on file  Food Insecurity:   . Worried About Charity fundraiser in the Last Year: Not on file  . Ran Out of Food in the Last Year: Not on file  Transportation Needs:   . Lack of Transportation  (Medical): Not on file  . Lack of Transportation (Non-Medical): Not on file  Physical Activity:   . Days of Exercise per Week:  Not on file  . Minutes of Exercise per Session: Not on file  Stress:   . Feeling of Stress : Not on file  Social Connections:   . Frequency of Communication with Friends and Family: Not on file  . Frequency of Social Gatherings with Friends and Family: Not on file  . Attends Religious Services: Not on file  . Active Member of Clubs or Organizations: Not on file  . Attends Archivist Meetings: Not on file  . Marital Status: Not on file  Intimate Partner Violence:   . Fear of Current or Ex-Partner: Not on file  . Emotionally Abused: Not on file  . Physically Abused: Not on file  . Sexually Abused: Not on file    Outpatient Medications Prior to Visit  Medication Sig Dispense Refill  . amLODipine (NORVASC) 5 MG tablet Take 1 tablet (5 mg total) by mouth daily. 90 tablet 3  . colestipol (COLESTID) 1 g tablet     . Cyanocobalamin (VITAMIN B-12 IJ) Inject 1 mL as directed every 30 (thirty) days.     Marland Kitchen denosumab (PROLIA) 60 MG/ML SOLN injection Inject 60 mg into the skin every 6 (six) months. Administer in upper arm, thigh, or abdomen    . donepezil (ARICEPT) 10 MG tablet TAKE 1 TABLET AT BEDTIME 90 tablet 3  . fluticasone (FLONASE) 50 MCG/ACT nasal spray Place 2 sprays into both nostrils daily. 16 g 6  . gabapentin (NEURONTIN) 300 MG capsule TAKE 1 TO 2 CAPSULES BY MOUTH UP TO TWO TIMES DAILY AS NEEDED 180 capsule 3  . leflunomide (ARAVA) 20 MG tablet Take 1 tablet (20 mg total) by mouth daily. 30 tablet 0  . losartan (COZAAR) 50 MG tablet TAKE 1 TABLET EVERY DAY 90 tablet 2  . Melatonin 10 MG CAPS Take 10 capsules by mouth daily.    . memantine (NAMENDA) 10 MG tablet TAKE 1 TABLET TWICE DAILY 180 tablet 4  . metFORMIN (GLUCOPHAGE-XR) 500 MG 24 hr tablet Take 500 mg by mouth daily with breakfast.    . metoprolol tartrate (LOPRESSOR) 100 MG tablet TAKE 1  AND 1/2 TABLETS TWICE DAILY 270 tablet 3  . ondansetron (ZOFRAN-ODT) 8 MG disintegrating tablet Take 1 tablet by mouth as needed for nausea.    . pantoprazole (PROTONIX) 40 MG tablet Take 40 mg by mouth 2 (two) times daily.    . polyethylene glycol powder (MIRALAX) powder Take 17 g by mouth daily as needed for mild constipation or moderate constipation.     . primidone (MYSOLINE) 50 MG tablet TAKE 1 TABLET AT BEDTIME 90 tablet 1  . vancomycin (VANCOCIN) 125 MG capsule Take by mouth.    . venlafaxine XR (EFFEXOR-XR) 75 MG 24 hr capsule TAKE 1 CAPSULE EVERY DAY WITH BREAKFAST 90 capsule 3  . amLODipine (NORVASC) 5 MG tablet Take 1 tablet (5 mg total) by mouth daily. 30 tablet 0   No facility-administered medications prior to visit.    Allergies  Allergen Reactions  . Codeine Nausea And Vomiting  . Myrbetriq  [Mirabegron] Other (See Comments)    SEVERE HEADACHE  . Percocet [Oxycodone-Acetaminophen] Nausea And Vomiting  . Propoxyphene Nausea And Vomiting  . Nystatin   . Celebrex [Celecoxib] Other (See Comments)    Other reaction(s): Other flushed  . Darvocet [Propoxyphene N-Acetaminophen] Nausea And Vomiting  . Erythromycin Nausea And Vomiting    Nausea and vomiting   . Hydrocodone Nausea And Vomiting  . Lyrica [Pregabalin] Swelling    Legs swelling   .  Macrodantin [Nitrofurantoin] Nausea And Vomiting  . Toradol [Ketorolac Tromethamine] Nausea And Vomiting    Per patient, only PO form causes nausea and vomiting. Can take injection without issue  . Tramadol Nausea And Vomiting  . Verapamil Nausea And Vomiting    REACTION: intolerance    ROS Review of Systems    Objective:    Physical Exam Vitals reviewed.  Constitutional:      Appearance: She is well-developed.  HENT:     Head: Normocephalic and atraumatic.  Eyes:     Conjunctiva/sclera: Conjunctivae normal.  Cardiovascular:     Rate and Rhythm: Normal rate.  Pulmonary:     Effort: Pulmonary effort is normal.   Skin:    General: Skin is dry.     Coloration: Skin is not pale.     Comments: Approx 2 cm on the left posterior labia near the vaginal opening.  There are 2 large sores in the center.  No active drainage.  Just some mild erythema but no streaking much.  Neurological:     Mental Status: She is alert and oriented to person, place, and time.  Psychiatric:        Behavior: Behavior normal.     BP (!) 124/56   Pulse (!) 57   Ht 5\' 6"  (1.676 m)   Wt 176 lb (79.8 kg)   SpO2 99%   BMI 28.41 kg/m  Wt Readings from Last 3 Encounters:  09/11/20 176 lb (79.8 kg)  08/28/20 176 lb 6.4 oz (80 kg)  08/09/20 179 lb (81.2 kg)     Health Maintenance Due  Topic Date Due  . FOOT EXAM  11/15/2019  . OPHTHALMOLOGY EXAM  04/10/2020    There are no preventive care reminders to display for this patient.  Lab Results  Component Value Date   TSH 1.34 05/21/2020   Lab Results  Component Value Date   WBC 5.9 09/09/2020   HGB 11.3 (L) 09/09/2020   HCT 35.3 09/09/2020   MCV 86.9 09/09/2020   PLT 400 09/09/2020   Lab Results  Component Value Date   NA 133 (L) 09/09/2020   K 4.4 09/09/2020   CO2 27 09/09/2020   GLUCOSE 107 09/09/2020   BUN 9 09/09/2020   CREATININE 0.80 09/09/2020   BILITOT 0.3 09/09/2020   ALKPHOS 73 05/21/2020   AST 22 09/09/2020   ALT 20 09/09/2020   PROT 6.3 09/09/2020   ALBUMIN 4.0 05/21/2020   CALCIUM 9.8 09/09/2020   ANIONGAP 12 02/22/2020   GFR 74.39 05/21/2020   Lab Results  Component Value Date   CHOL 128 06/07/2019   Lab Results  Component Value Date   HDL 52 06/07/2019   Lab Results  Component Value Date   LDLCALC 51 06/07/2019   Lab Results  Component Value Date   TRIG 97 11/07/2019   Lab Results  Component Value Date   CHOLHDL 2.5 06/07/2019   Lab Results  Component Value Date   HGBA1C 6.0 (A) 06/11/2020      Assessment & Plan:   Problem List Items Addressed This Visit      Musculoskeletal and Integument   Osteoporosis    Cyst of skin and subcutaneous tissue   Age-related osteoporosis without current pathological fracture    Given Prolia today.  Repeat in 6 months.  Continue taking adequate calcium with vitamin D supplementation.        Other   Pernicious anemia - Primary    Pernicious anemia.  Given B12 injection  today.  Repeat in 30 days.  Is been most a year since we last checked her level and we need to get that up-to-date. Lab Results  Component Value Date   [id number] 10/03/2019         Relevant Orders   B12   MCI (mild cognitive impairment)    Will undergo neuropsychiatric examination in December.  Continue with her Aricept and Namenda for now.  Neurology is managing this condition.         Cyst of the skin of the left labia near the vaginal opening-recommend warm compresses or sitz baths.  If it drains and goes away on its own then no further work-up needed.  If it is getting worse or does not improving after the weekend then please let me know.  She is actually already on Keflex and vancomycin for urinary tract infection.  Headaches-suspect they have been more frequent because of recent UTI and antibiotic use.  Continue to hydrate well but make sure not taking in too much fluids since her sodium was recently off.  Consider could also be sinus related but again she is currently on antibiotics so I would assume if it were a bacterial sinusitis she would actually be improving somewhat.  Hyponatremia-make sure not starting to take in too much fluids lites look back and okay but she does have a follow-up with Dr. Gayla Medicus for next week.  Meds ordered this encounter  Medications  . denosumab (PROLIA) injection 60 mg  . cyanocobalamin ((VITAMIN B-12)) injection 1,000 mcg    Follow-up: Return in about 4 weeks (around 10/09/2020) for B12 injection.   I spent 35  minutes on the day of the encounter to include pre-visit record review, face-to-face time with the patient and post visit ordering  of test.   [name], MD

## 2020-09-11 NOTE — Assessment & Plan Note (Signed)
Pernicious anemia.  Given B12 injection today.  Repeat in 30 days.  Is been most a year since we last checked her level and we need to get that up-to-date. Lab Results  Component Value Date   [id number] 327 10/03/2019

## 2020-09-12 ENCOUNTER — Inpatient Hospital Stay: Admit: 2020-09-12 | Payer: MEDICARE | Primary: Internal Medicine

## 2020-09-13 ENCOUNTER — Inpatient Hospital Stay: Admit: 2020-09-13 | Payer: MEDICARE | Primary: Internal Medicine

## 2020-09-16 ENCOUNTER — Inpatient Hospital Stay: Admit: 2020-09-16 | Payer: MEDICARE | Primary: Internal Medicine

## 2020-09-17 ENCOUNTER — Other Ambulatory Visit: Payer: Self-pay

## 2020-09-17 ENCOUNTER — Ambulatory Visit (INDEPENDENT_AMBULATORY_CARE_PROVIDER_SITE_OTHER): Payer: Medicare Other | Admitting: Internal Medicine

## 2020-09-17 ENCOUNTER — Encounter: Payer: Self-pay | Admitting: Internal Medicine

## 2020-09-17 ENCOUNTER — Inpatient Hospital Stay: Admit: 2020-09-17 | Payer: MEDICARE | Primary: Internal Medicine

## 2020-09-17 VITALS — BP 126/76 | HR 61 | Ht 66.0 in | Wt 176.1 lb

## 2020-09-17 DIAGNOSIS — E232 Diabetes insipidus: Secondary | ICD-10-CM

## 2020-09-17 DIAGNOSIS — R631 Polydipsia: Secondary | ICD-10-CM

## 2020-09-17 NOTE — Patient Instructions (Signed)
-   Your sodium is slightly low . Please continue to stay off hydrochlorothiazide and limiting your daily fluid intake to 65 ounce.

## 2020-09-17 NOTE — Progress Notes (Signed)
Name: Mia [name]  MRN/ DOB: [id number], Oct 16, 1951    Age/ Sex: 69 y.o., female     PCP: Hali Marry, MD   Reason for Endocrinology Evaluation: Hyponatremia      Initial Endocrinology Clinic Visit: 05/21/2020    PATIENT IDENTIFIER: Mia [name] is a 69 y.o., female with a past medical history of emphysema, OSA on CPAP , SVT and Raynaud's phenomenon, RA ,and memory loss . She has followed with [facility] since 05/21/2020 for consultative assistance with management of her Hyponatremia    HISTORICAL SUMMARY:  Pt has been noted with hyponatremia in 02/2020 with a nadir of 129 mmol/L.   She was consuming ~ 60-80 oz of crushed ice as well as ~ 40 oz of fluid a day. She was on HCTZ at the time, which we stopped.    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   She was drinking ~160 oz of fluids a day in addition to being on HCTZ. Sodium normalized with fluid reduction and stopping HCTZ.    SUBJECTIVE:    Today (09/17/2020):  Mia [name] is here for a follow up on hyponatremia. She is accompanied by her husband today.    Nausea has resolved with colestipol  Denies dizziness  Has chronic imbalance issues Drinks 42 ounce liquid plus a cup  crushed ice + 16 ounce of coffee   Has frequency that she attributes to interstitial cystitis.      HISTORY:  Past Medical History:  Past Medical History:  Diagnosis Date  . Anal fissure   . Anemia   . Arthritis   . Blood transfusion   . C. difficile colitis   . Chest pain    a. 01/2013 MV: EF 59%, no ischemia.  . Chronic Dyspnea    a. 01/2013 Echo: EF 60-65%, Gr 1 DD, PASP 55mmHg.// Echo 01/2020: EF 60-65, no RWMA, GR 1 DD, GLS -21.6%, normal RV SF, trivial MR   . COVID-19   . DDD (degenerative disc disease), cervical   . DDD (degenerative disc disease), lumbar   . Diabetes mellitus   . Fibromyalgia   . Gall stones   . GERD (gastroesophageal reflux  disease)    gastritis  . Hemorrhoids   . Hypertension   . Interstitial cystitis   . MCI (mild cognitive impairment) 11/25/2017  . Memory difficulty 12/14/2013  . Neuromuscular disorder ([place])    sclerosis  . Osteoporosis   . Peptic ulcer   . PONV (postoperative nausea and vomiting)   . Pseudogout   . Raynaud phenomenon   . Rectal bleeding   . Sleep apnea    a. on cpap.  Marland Kitchen SVT (supraventricular tachycardia) ([place])   . Syncope 09/10/2015  . Thyroid disease    hypothyroidism  . Tremor, essential 05/14/2016   Past Surgical History:  Past Surgical History:  Procedure Laterality Date  . APPENDECTOMY    . BLADDER SURGERY     x2  . BLADDER SURGERY    . BREAST BIOPSY    . CARDIAC CATHETERIZATION    . CARPAL TUNNEL RELEASE    . CATARACT EXTRACTION Bilateral   . CHOLECYSTECTOMY    . DILATION AND CURETTAGE OF UTERUS    . ENTEROCELE REPAIR     x2  . EYE SURGERY     retina  . hysterectomy - unknown type    . JOINT REPLACEMENT    . KNEE  ARTHROPLASTY    . KNEE SURGERY     x6  . NECK SURGERY     fusion  . OOPHORECTOMY    . QUADRICEPS REPAIR Right   . RECTOCELE REPAIR     x2  . SHOULDER ARTHROSCOPY WITH SUBACROMIAL DECOMPRESSION, ROTATOR CUFF REPAIR AND BICEP TENDON REPAIR  10/06/2012   Procedure: SHOULDER ARTHROSCOPY WITH SUBACROMIAL DECOMPRESSION, ROTATOR CUFF REPAIR AND BICEP TENDON REPAIR;  Surgeon: Nita Sells, MD;  Location: [place];  Service: Orthopedics;  Laterality: Right;  Arthroscopic  Repair  of  Subscapularis, Open Biceps Tenodesis  . SHOULDER SURGERY     bilateral- bones spur  . TONSILLECTOMY    . TOTAL KNEE ARTHROPLASTY    . TOTAL SHOULDER ARTHROPLASTY      Social History:  reports that she has never smoked. She has never used smokeless tobacco. She reports that she does not drink alcohol and does not use drugs. Family History:  Family History  Problem Relation Age of Onset  . Heart attack Mother   . Stroke Mother   . Diabetes  Mother   . Heart disease Mother   . Colon polyps Mother   . Asthma Mother   . Breast cancer Maternal Grandmother   . Wilson's disease Maternal Grandmother   . Pancreatic cancer Maternal Grandfather   . Heart disease Father   . Heart attack Father   . Asthma Father   . Stroke Sister   . Hypertension Sister   . Rheum arthritis Sister   . Dementia Sister   . Asthma Sister   . Lupus Sister   . Asthma Sister      HOME MEDICATIONS: Allergies as of 09/17/2020      Reactions   Codeine Nausea And Vomiting   Myrbetriq  [mirabegron] Other (See Comments)   SEVERE HEADACHE   Percocet [oxycodone-acetaminophen] Nausea And Vomiting   Propoxyphene Nausea And Vomiting   Nystatin    Celebrex [celecoxib] Other (See Comments)   Other reaction(s): Other flushed   Darvocet [propoxyphene N-acetaminophen] Nausea And Vomiting   Erythromycin Nausea And Vomiting   Nausea and vomiting   Hydrocodone Nausea And Vomiting   Lyrica [pregabalin] Swelling   Legs swelling   Macrodantin [nitrofurantoin] Nausea And Vomiting   Toradol [ketorolac Tromethamine] Nausea And Vomiting   Per patient, only PO form causes nausea and vomiting. Can take injection without issue   Tramadol Nausea And Vomiting   Verapamil Nausea And Vomiting   REACTION: intolerance      Medication List       Accurate as of September 17, 2020 12:16 PM. If you have any questions, ask your nurse or doctor.        amLODipine 5 MG tablet Commonly known as: NORVASC Take 1 tablet (5 mg total) by mouth daily.   colestipol 1 g tablet Commonly known as: COLESTID   denosumab 60 MG/ML Soln injection Commonly known as: PROLIA Inject 60 mg into the skin every 6 (six) months. Administer in upper arm, thigh, or abdomen   donepezil 10 MG tablet Commonly known as: ARICEPT TAKE 1 TABLET AT BEDTIME   fluticasone 50 MCG/ACT nasal spray Commonly known as: FLONASE Place 2 sprays into both nostrils daily.   gabapentin 300 MG  capsule Commonly known as: NEURONTIN TAKE 1 TO 2 CAPSULES BY MOUTH UP TO TWO TIMES DAILY AS NEEDED   leflunomide 20 MG tablet Commonly known as: ARAVA Take 1 tablet (20 mg total) by mouth daily.   losartan 50  MG tablet Commonly known as: COZAAR TAKE 1 TABLET EVERY DAY What changed: how much to take   Melatonin 10 MG Caps Take 10 capsules by mouth daily.   memantine 10 MG tablet Commonly known as: NAMENDA TAKE 1 TABLET TWICE DAILY   metFORMIN 500 MG 24 hr tablet Commonly known as: GLUCOPHAGE-XR Take 500 mg by mouth daily with breakfast.   metoprolol tartrate 100 MG tablet Commonly known as: LOPRESSOR TAKE 1 AND 1/2 TABLETS TWICE DAILY   MiraLax 17 GM/SCOOP powder Generic drug: polyethylene glycol powder Take 17 g by mouth daily as needed for mild constipation or moderate constipation.   ondansetron 8 MG disintegrating tablet Commonly known as: ZOFRAN-ODT Take 1 tablet by mouth as needed for nausea.   pantoprazole 40 MG tablet Commonly known as: PROTONIX Take 40 mg by mouth 2 (two) times daily.   primidone 50 MG tablet Commonly known as: MYSOLINE TAKE 1 TABLET AT BEDTIME   vancomycin 125 MG capsule Commonly known as: VANCOCIN Take by mouth.   venlafaxine XR 75 MG 24 hr capsule Commonly known as: EFFEXOR-XR TAKE 1 CAPSULE EVERY DAY WITH BREAKFAST   VITAMIN B-12 IJ Inject 1 mL as directed every 30 (thirty) days.         OBJECTIVE:   PHYSICAL EXAM: VS: BP 126/76   Pulse 61   Ht 5\' 6"  (1.676 m)   Wt 176 lb 2 oz (79.9 kg)   SpO2 95%   BMI 28.43 kg/m    EXAM: General: Pt appears well and is in NAD  Lungs: Clear with good BS bilat with no rales, rhonchi, or wheezes  Heart: Auscultation: RRR.  Abdomen: Normoactive bowel sounds, soft, nontender, without masses or organomegaly palpable  Extremities:  BL LE: Trace pretibial edema   Mental Status: Judgment, insight: Intact Orientation: Oriented to time, place, and person Mood and affect: No  depression, anxiety, or agitation     DATA REVIEWED:  Results for STEPHENY, CANAL (MRN [id number]) as of 09/17/2020 12:17  Ref. Range 09/09/2020 12:10  Sodium Latest Ref Range: 135 - 146 mmol/L 133 (L)  Potassium Latest Ref Range: 3.5 - 5.3 mmol/L 4.4  Chloride Latest Ref Range: 98 - 110 mmol/L 98  CO2 Latest Ref Range: 20 - 32 mmol/L 27  Glucose Latest Ref Range: 65 - 139 mg/dL 107  BUN Latest Ref Range: 7 - 25 mg/dL 9  Creatinine Latest Ref Range: 0.50 - 0.99 mg/dL 0.80  Calcium Latest Ref Range: 8.6 - 10.4 mg/dL 9.8  BUN/Creatinine Ratio Latest Ref Range: 6 - 22 (calc) NOT APPLICABLE  AG Ratio Latest Ref Range: 1.0 - 2.5 (calc) 1.7  AST Latest Ref Range: 10 - 35 U/L 22  ALT Latest Ref Range: 6 - 29 U/L 20  Total Protein Latest Ref Range: 6.1 - 8.1 g/dL 6.3  Total Bilirubin Latest Ref Range: 0.2 - 1.2 mg/dL 0.3  GFR, Est Non African American Latest Ref Range: > OR = 60 mL/min/1.43m2 75  GFR, Est African American Latest Ref Range: > OR = 60 mL/min/1.14m2 87     ASSESSMENT / PLAN / RECOMMENDATIONS:   1. Primary Polydipsia:   - This is her main cause for hyponatremia given low urine Osm and almost non detectable urine sodium. - HCTZ could also be a contributing factor - Sodium goal is above 130 mEq/L. Recent check showed slight reduction at 133 mEq/L which is acceptable  - Pt to limit liquid intake to 65 oz daily     F/U PRN  Signed electronically by: Mack Guise, MD  [facility] Endocrinology  [facility] [address], [place], [place] [address] Phone: [phone] FAX: [phone]      CC: Hali Marry, [place] [address] Phone: [phone]  Fax: [phone]   Return to Endocrinology clinic as below: Future Appointments  Date Time Provider [place]  10/02/2020  2:00 PM Alfonso Ellis CPR-[facility] CPR  10/09/2020  9:30 AM PCK NURSE [facility]PC[facility] None  11/21/2020 10:20 AM  Nahser, Wonda Cheng, MD [facility]  12/23/2020 10:45 AM Evans Lance, MD [facility]  01/10/2021 10:30 AM Bo Merino, MD [facility] None  03/04/2021 10:30 AM Mast Givens, NP [facility] None  03/11/2021 10:50 AM Hali Marry, MD [facility] None

## 2020-09-18 ENCOUNTER — Inpatient Hospital Stay: Admit: 2020-09-18 | Payer: MEDICARE | Primary: Internal Medicine

## 2020-09-18 DIAGNOSIS — M47812 Spondylosis without myelopathy or radiculopathy, cervical region: Secondary | ICD-10-CM | POA: Diagnosis not present

## 2020-09-19 ENCOUNTER — Encounter: Payer: MEDICARE | Primary: Internal Medicine

## 2020-09-20 ENCOUNTER — Encounter: Payer: MEDICARE | Primary: Internal Medicine

## 2020-09-23 ENCOUNTER — Inpatient Hospital Stay: Admit: 2020-09-23 | Payer: MEDICARE | Primary: Internal Medicine

## 2020-09-24 ENCOUNTER — Inpatient Hospital Stay: Admit: 2020-09-24 | Payer: MEDICARE | Primary: Internal Medicine

## 2020-09-25 ENCOUNTER — Inpatient Hospital Stay: Admit: 2020-09-25 | Payer: MEDICARE | Primary: Internal Medicine

## 2020-09-25 DIAGNOSIS — C50512 Malignant neoplasm of lower-outer quadrant of left female breast: Secondary | ICD-10-CM

## 2020-09-26 ENCOUNTER — Inpatient Hospital Stay: Admit: 2020-09-26 | Payer: MEDICARE | Primary: Internal Medicine

## 2020-09-27 ENCOUNTER — Inpatient Hospital Stay: Admit: 2020-09-27 | Payer: MEDICARE | Primary: Internal Medicine

## 2020-09-30 ENCOUNTER — Inpatient Hospital Stay: Admit: 2020-09-30 | Payer: MEDICARE | Primary: Internal Medicine

## 2020-10-01 ENCOUNTER — Inpatient Hospital Stay: Admit: 2020-10-01 | Payer: MEDICARE | Primary: Internal Medicine

## 2020-10-02 ENCOUNTER — Encounter: Payer: Medicare Other | Attending: Psychology | Admitting: Psychology

## 2020-10-02 ENCOUNTER — Encounter: Payer: Self-pay | Admitting: Psychology

## 2020-10-02 ENCOUNTER — Other Ambulatory Visit: Payer: Self-pay

## 2020-10-02 ENCOUNTER — Inpatient Hospital Stay: Admit: 2020-10-02 | Payer: MEDICARE | Primary: Internal Medicine

## 2020-10-02 DIAGNOSIS — R413 Other amnesia: Secondary | ICD-10-CM

## 2020-10-02 NOTE — Progress Notes (Signed)
NEUROBEHAVIORAL STATUS EXAM   Name: Mia [name] Date of Birth: 10-15-1951 Date of Interview: 10/02/2020  Reason for Referral:  Mia [name] is a 69 y.o. female who is referred for neuropsychological evaluation by Mia [name] of [facility] due to concerns about Mia [name] memory and neuromuscular function; several recent falls and pronated gait. This [name] is accompanied in Mia office by Mia [name] husband who supplements Mia history.   History of Presenting Problem: Mia [name] is a 69 year old right-handed, [place] American, female, with a history of neuromuscular changes (e.g., essential tremor, impaired gait w/left sided drift, recent falls, dropping things more, weakness, etc.) and memory decline for Mia past 10 to 11 years.  She is currently treated with Aricept and Namenda for Mia [name] memory complaints. She contracted Covid and required hospitalization circa 10/2019.  She has reported problems with fatigue and decreased information processing speed since. She has recurrent neck and lower back pain and currently being followed by an orthopedic surgeon.  She does not use a cane.    According to medical records, Mia [name] was seen by Mia Givens, NP (Neurology). This provider's note indicated that  In regards to Mia [name] memory she feels that it has gotten worse since having Covid in January.  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  She is able to complete all ADLs independently.  She has noticed some change in Mia [name] balance.  States that she recently had a fall and injured Mia [name] elbow. Feels that Mia [name] tremor has been well controlled with Mysoline.  Primarily just affects Mia hands. Reports that fibromyalgia continues to be fairly controlled with Effexor.  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    Onset/Course: Insidious with gradual worsening  Upon direct questioning, Mia [name]/caregiver reported:    Forgetting recent conversations/events: Yes, dates are reportedly "Mia worst". Onset circa 2009.  Repeating statements/questions: Described mild difficulties Misplacing/losing items: Yes, more recent worsening.  Forgetting appointments or other obligations: Uses calender without difficulty.  Forgetting to take medications: Yes, more recent difficulty. Uses pill organizer but admitted to instances of accidentally taking medications at Mia wrong times (mixes up am and pm on occasion)  Finances: Manages own finances. Some recent difficulty but denied any late payments. She reportedly has a system where she checks account balances and statements.   Difficulty concentrating: Denied  Starting but not finishing tasks: Denied  Distracted easily: Denied  Processing information more slowly: Mild change  Word-finding difficulty: Denied  Word substitutions: Yes, "drives me crazy", onset in last couple years Writing difficulty: Denied  Spelling difficulty: Denied  Comprehension difficulty: Denied   Getting lost when driving: Denied current difficulty  Making wrong turns when driving: Denied current difficulty Uncertain about directions when driving or passenger: Denied current difficulty  Family neuro hx: Unknown Any family hx dementia? Mother and older sister   Current Functioning:  Work: Retired Scientist, research (physical sciences)   Complex ADLs Driving: Currently able Medication management: Currently able to perform IND but with some increased difficulty  Management of finances: Currently able to perform IND but with some increased difficulty in last 1-2 years.  Appointments: Able to manage via [place].  Cooking: Performed less since retirement but still able to do much problem. One instance this past year where forgot to turn oven off.  Reportedly realized after 10-20 minutes and turned it off.   Medical/Physical complaints:  Any hx of stroke/TIA,  MI, LOC/TBI, Sz? Hx falls? Yes multiple in recent years. Has  fallen twice in past 2 months.  Balance, probs walking? Yes, drifts to left side and generally unsteady. Trouble starting and stopping movement.  Sleep: Insomnia? OSA? CPAP? REM sleep beh sx? Compliant with CPAP for OSA.  Longstanding history of REM sleep behavioral disturbance that includes talking, excessive movements, and vivid dreams.    Visual illusions/hallucinations? Denied  Appetite/Nutrition/Weight changes: Denied   Current mood: Euthymic   Behavioral disturbance/Personality change: Reportedly uses profanity more often when upset where she "would never curse before".   Suicidal Ideation/Intention: Denied   Psychiatric History: History of depression, anxiety, other MH disorder: Denied  History of MH treatment: Denied  History of SI: Denied  History of substance dependence/treatment: Denied   Social History: Born/Raised: [place], [place] Education: 20, 4.0 GPA from [employer], [place] Occupational history: HR Director  Marital history: Married 26 years Children: 2 Alcohol: Denied  Tobacco: Denied  SA: Denied   Medical History: Past Medical History:  Diagnosis Date  . Anal fissure   . Anemia   . Arthritis   . Blood transfusion   . C. difficile colitis   . Chest pain    a. 01/2013 MV: EF 59%, no ischemia.  . Chronic Dyspnea    a. 01/2013 Echo: EF 60-65%, Gr 1 DD, PASP 43mmHg.// Echo 01/2020: EF 60-65, no RWMA, GR 1 DD, GLS -21.6%, normal RV SF, trivial MR   . COVID-19   . DDD (degenerative disc disease), cervical   . DDD (degenerative disc disease), lumbar   . Diabetes mellitus   . Fibromyalgia   . Gall stones   . GERD (gastroesophageal reflux disease)    gastritis  . Hemorrhoids   . Hypertension   . Interstitial cystitis   . MCI (mild cognitive impairment) 11/25/2017  . Memory difficulty 12/14/2013  . Neuromuscular disorder ([place])    sclerosis  . Osteoporosis   . Peptic ulcer   . PONV (postoperative nausea and vomiting)   . Pseudogout   . Raynaud phenomenon   .  Rectal bleeding   . Sleep apnea    a. on cpap.  Marland Kitchen SVT (supraventricular tachycardia) ([place])   . Syncope 09/10/2015  . Thyroid disease    hypothyroidism  . Tremor, essential 05/14/2016   Current Medications:  Outpatient Encounter Medications as of 10/02/2020  Medication Sig  . amLODipine (NORVASC) 5 MG tablet Take 1 tablet (5 mg total) by mouth daily.  . colestipol (COLESTID) 1 g tablet   . Cyanocobalamin (VITAMIN B-12 IJ) Inject 1 mL as directed every 30 (thirty) days.   Marland Kitchen denosumab (PROLIA) 60 MG/ML SOLN injection Inject 60 mg into Mia skin every 6 (six) months. Administer in upper arm, thigh, or abdomen  . donepezil (ARICEPT) 10 MG tablet TAKE 1 TABLET AT BEDTIME  . fluticasone (FLONASE) 50 MCG/ACT nasal spray Place 2 sprays into both nostrils daily.  Marland Kitchen gabapentin (NEURONTIN) 300 MG capsule TAKE 1 TO 2 CAPSULES BY MOUTH UP TO TWO TIMES DAILY AS NEEDED  . leflunomide (ARAVA) 20 MG tablet Take 1 tablet (20 mg total) by mouth daily.  Marland Kitchen losartan (COZAAR) 50 MG tablet TAKE 1 TABLET EVERY DAY ([name] taking differently: Take 100 mg by mouth daily. )  . Melatonin 10 MG CAPS Take 10 capsules by mouth daily.  . memantine (NAMENDA) 10 MG tablet TAKE 1 TABLET TWICE DAILY  . metFORMIN (GLUCOPHAGE-XR) 500 MG 24 hr tablet Take 500 mg by mouth daily with breakfast.  .  metoprolol tartrate (LOPRESSOR) 100 MG tablet TAKE 1 AND 1/2 TABLETS TWICE DAILY  . ondansetron (ZOFRAN-ODT) 8 MG disintegrating tablet Take 1 tablet by mouth as needed for nausea.  . pantoprazole (PROTONIX) 40 MG tablet Take 40 mg by mouth 2 (two) times daily.  . polyethylene glycol powder (MIRALAX) powder Take 17 g by mouth daily as needed for mild constipation or moderate constipation.   . primidone (MYSOLINE) 50 MG tablet TAKE 1 TABLET AT BEDTIME  . venlafaxine XR (EFFEXOR-XR) 75 MG 24 hr capsule TAKE 1 CAPSULE EVERY DAY WITH BREAKFAST   No facility-administered encounter medications on file as of 10/02/2020.   Behavioral  Observations:   Appearance: Neatly, casually and appropriately dressed and groomed Gait: Ambulated independently but with some difficulty due to left sided drift and general unsteadiness. Slow to start and stop movements. Titubation noted worse on left side.   Speech: Fluent; normal rate, rhythm and volume. Mild word finding difficulty. Thought process: Linear, goal directed, logical Affect: Full, congruent with mood  Interpersonal: Pleasant, appropriate  60 minutes spent face-to-face with [name] completing neurobehavioral status exam. 60 minutes spent integrating medical records/clinical data and completing this report. [id number] unit; [id number].  TESTING: There is medical necessity to proceed with neuropsychological assessment as Mia results will be used to aid in differential diagnosis and clinical decision-making and to inform specific treatment recommendations. Per Mia [name], husband, and medical records reviewed, there has been a change in cognitive functioning and a reasonable suspicion of dementia.   Clinical Decision Making: In considering Mia [name]'s current level of functioning, level of presumed impairment, nature of symptoms, emotional and behavioral responses during Mia interview, level of literacy, and observed level of motivation, a battery of tests was selected for [name] to complete during 4 hour testing appointment.  PLAN: Mia [name] will return to complete Mia above referenced full battery of neuropsychological testing with this provider on 11/11/20.  Education regarding testing procedures was provided to Mia [name]. Subsequently, Mia [name] will see this provider for a follow-up session at which time Mia [name] test performances and my impressions and treatment recommendations will be reviewed in detail.   Evaluation ongoing; full report to follow.

## 2020-10-03 ENCOUNTER — Encounter: Payer: Self-pay | Admitting: Nurse Practitioner

## 2020-10-03 ENCOUNTER — Ambulatory Visit (INDEPENDENT_AMBULATORY_CARE_PROVIDER_SITE_OTHER): Payer: Medicare Other | Admitting: Nurse Practitioner

## 2020-10-03 ENCOUNTER — Ambulatory Visit (INDEPENDENT_AMBULATORY_CARE_PROVIDER_SITE_OTHER): Payer: Medicare Other

## 2020-10-03 ENCOUNTER — Inpatient Hospital Stay: Admit: 2020-10-03 | Payer: MEDICARE | Primary: Internal Medicine

## 2020-10-03 VITALS — BP 108/69 | HR 51 | Temp 98.1°F | Ht 66.0 in | Wt 177.7 lb

## 2020-10-03 DIAGNOSIS — M25562 Pain in left knee: Secondary | ICD-10-CM

## 2020-10-03 DIAGNOSIS — W19XXXA Unspecified fall, initial encounter: Secondary | ICD-10-CM

## 2020-10-03 DIAGNOSIS — M25512 Pain in left shoulder: Secondary | ICD-10-CM

## 2020-10-03 DIAGNOSIS — M1712 Unilateral primary osteoarthritis, left knee: Secondary | ICD-10-CM | POA: Diagnosis not present

## 2020-10-03 DIAGNOSIS — G8911 Acute pain due to trauma: Secondary | ICD-10-CM | POA: Diagnosis not present

## 2020-10-03 DIAGNOSIS — M11262 Other chondrocalcinosis, left knee: Secondary | ICD-10-CM | POA: Diagnosis not present

## 2020-10-03 DIAGNOSIS — M25462 Effusion, left knee: Secondary | ICD-10-CM | POA: Diagnosis not present

## 2020-10-03 DIAGNOSIS — M19012 Primary osteoarthritis, left shoulder: Secondary | ICD-10-CM | POA: Diagnosis not present

## 2020-10-03 NOTE — Patient Instructions (Addendum)
We will get an x-ray of your left knee and left shoulder today.   If there are no fractures then we will plan on starting the exercises and maybe physical therapy.   I will call you with the results.   In the meantime you can alternate ice and heat for 20 minutes each 3-4 times a day and gently stretch the leg and the shoulder.

## 2020-10-03 NOTE — Progress Notes (Signed)
Acute Office Visit  Subjective:    Patient ID: Mia [name], female    DOB: [birth year], 69 y.o.   MRN: [id number]  Chief Complaint  Patient presents with  . Fall    HPI Patient is in today for a fall in October of this year where she landed on her left knee and left upper extremity on concrete. She reports that she had immediate pain and inflammation of both the knee and the left shoulder area, but she assumed the injuries were bruises and that they would heal. She reports the pain has worsened since the original injury and she is concerned.   She endorses pain with walking, getting up and down from a seated position, using stairs, and putting pressure on her left knee. She localized the pain to the anterior portion of the knee cap.   She endorses decreased ROM and pain with lateral extension and posterior flexion of the left arm/shoulder. She also endorses weakness and significant pain at night when she rolls onto the affected shoulder.   She denies significant swelling or bruising to either extremity.   She does tell me that she had an injection into her neck for neck and shoulder pain provided by orthopedics she stated that this helped the neck and upper back pain, but her shoulder pain is still present.   She has not tried any therapy exercises or had any imaging. She has not been wearing a brace or limiting her ROM.  She is going out of town on the 21st to [place] to stay about a month and she would like to have improvement of symptoms before leaving.   Past Medical History:  Diagnosis Date  . Anal fissure   . Anemia   . Arthritis   . Blood transfusion   . C. difficile colitis   . Chest pain    a. 01/2013 MV: EF 59%, no ischemia.  . Chronic Dyspnea    a. 01/2013 Echo: EF 60-65%, Gr 1 DD, PASP 54mmHg.// Echo 01/2020: EF 60-65, no RWMA, GR 1 DD, GLS -21.6%, normal RV SF, trivial MR   . COVID-19   . DDD (degenerative disc disease), cervical   . DDD (degenerative disc disease),  lumbar   . Diabetes mellitus   . Fibromyalgia   . Gall stones   . GERD (gastroesophageal reflux disease)    gastritis  . Hemorrhoids   . Hypertension   . Interstitial cystitis   . MCI (mild cognitive impairment) 11/25/2017  . Memory difficulty 12/14/2013  . Neuromuscular disorder ([place])    sclerosis  . Osteoporosis   . Peptic ulcer   . PONV (postoperative nausea and vomiting)   . Pseudogout   . Raynaud phenomenon   . Rectal bleeding   . Sleep apnea    a. on cpap.  Marland Kitchen SVT (supraventricular tachycardia) ([place])   . Syncope 09/10/2015  . Thyroid disease    hypothyroidism  . Tremor, essential 05/14/2016    Past Surgical History:  Procedure Laterality Date  . APPENDECTOMY    . BLADDER SURGERY     x2  . BLADDER SURGERY    . BREAST BIOPSY    . CARDIAC CATHETERIZATION    . CARPAL TUNNEL RELEASE    . CATARACT EXTRACTION Bilateral   . CHOLECYSTECTOMY    . DILATION AND CURETTAGE OF UTERUS    . ENTEROCELE REPAIR     x2  . EYE SURGERY     retina  . hysterectomy - unknown type    .  JOINT REPLACEMENT    . KNEE ARTHROPLASTY    . KNEE SURGERY     x6  . NECK SURGERY     fusion  . OOPHORECTOMY    . QUADRICEPS REPAIR Right   . RECTOCELE REPAIR     x2  . SHOULDER ARTHROSCOPY WITH SUBACROMIAL DECOMPRESSION, ROTATOR CUFF REPAIR AND BICEP TENDON REPAIR  10/06/2012   Procedure: SHOULDER ARTHROSCOPY WITH SUBACROMIAL DECOMPRESSION, ROTATOR CUFF REPAIR AND BICEP TENDON REPAIR;  Surgeon: Nita Sells, MD;  Location: [facility];  Service: Orthopedics;  Laterality: Right;  Arthroscopic  Repair  of  Subscapularis, Open Biceps Tenodesis  . SHOULDER SURGERY     bilateral- bones spur  . TONSILLECTOMY    . TOTAL KNEE ARTHROPLASTY    . TOTAL SHOULDER ARTHROPLASTY      Family History  Problem Relation Age of Onset  . Heart attack Mother   . Stroke Mother   . Diabetes Mother   . Heart disease Mother   . Colon polyps Mother   . Asthma Mother   . Breast cancer  Maternal Grandmother   . Wilson's disease Maternal Grandmother   . Pancreatic cancer Maternal Grandfather   . Heart disease Father   . Heart attack Father   . Asthma Father   . Stroke Sister   . Hypertension Sister   . Rheum arthritis Sister   . Dementia Sister   . Asthma Sister   . Lupus Sister   . Asthma Sister     Social History   Socioeconomic History  . Marital status: Married    Spouse name: Not on file  . Number of children: 2  . Years of education: 62  . Highest education level: Not on file  Occupational History  . Occupation: Retired  Tobacco Use  . Smoking status: Never Smoker  . Smokeless tobacco: Never Used  Vaping Use  . Vaping Use: Never used  Substance and Sexual Activity  . Alcohol use: No    Alcohol/week: 0.0 standard drinks  . Drug use: No  . Sexual activity: Not on file  Other Topics Concern  . Not on file  Social History Narrative   Patient drinks about 3-4 cups of caffeine daily.   Patient is right handed.   Social Determinants of Health   Financial Resource Strain: Not on file  Food Insecurity: Not on file  Transportation Needs: Not on file  Physical Activity: Not on file  Stress: Not on file  Social Connections: Not on file  Intimate Partner Violence: Not on file    Outpatient Medications Prior to Visit  Medication Sig Dispense Refill  . colestipol (COLESTID) 1 g tablet     . Cyanocobalamin (VITAMIN B-12 IJ) Inject 1 mL as directed every 30 (thirty) days.     Marland Kitchen denosumab (PROLIA) 60 MG/ML SOLN injection Inject 60 mg into the skin every 6 (six) months. Administer in upper arm, thigh, or abdomen    . donepezil (ARICEPT) 10 MG tablet TAKE 1 TABLET AT BEDTIME 90 tablet 3  . fluticasone (FLONASE) 50 MCG/ACT nasal spray Place 2 sprays into both nostrils daily. 16 g 6  . gabapentin (NEURONTIN) 300 MG capsule TAKE 1 TO 2 CAPSULES BY MOUTH UP TO TWO TIMES DAILY AS NEEDED 180 capsule 3  . leflunomide (ARAVA) 20 MG tablet Take 1 tablet (20 mg  total) by mouth daily. 30 tablet 0  . losartan (COZAAR) 50 MG tablet TAKE 1 TABLET EVERY DAY (Patient taking differently: Take 100  mg by mouth daily.) 90 tablet 2  . Melatonin 10 MG CAPS Take 10 capsules by mouth daily.    . memantine (NAMENDA) 10 MG tablet TAKE 1 TABLET TWICE DAILY 180 tablet 4  . metFORMIN (GLUCOPHAGE-XR) 500 MG 24 hr tablet Take 500 mg by mouth daily with breakfast.    . metoprolol tartrate (LOPRESSOR) 100 MG tablet TAKE 1 AND 1/2 TABLETS TWICE DAILY 270 tablet 3  . ondansetron (ZOFRAN-ODT) 8 MG disintegrating tablet Take 1 tablet by mouth as needed for nausea.    . pantoprazole (PROTONIX) 40 MG tablet Take 40 mg by mouth 2 (two) times daily.    . polyethylene glycol powder (GLYCOLAX/MIRALAX) 17 GM/SCOOP powder Take 17 g by mouth daily as needed for mild constipation or moderate constipation.     . primidone (MYSOLINE) 50 MG tablet TAKE 1 TABLET AT BEDTIME 90 tablet 1  . venlafaxine XR (EFFEXOR-XR) 75 MG 24 hr capsule TAKE 1 CAPSULE EVERY DAY WITH BREAKFAST 90 capsule 3  . amLODipine (NORVASC) 5 MG tablet Take 1 tablet (5 mg total) by mouth daily. 90 tablet 3   No facility-administered medications prior to visit.    Allergies  Allergen Reactions  . Codeine Nausea And Vomiting  . Myrbetriq  [Mirabegron] Other (See Comments)    SEVERE HEADACHE  . Percocet [Oxycodone-Acetaminophen] Nausea And Vomiting  . Propoxyphene Nausea And Vomiting  . Nystatin   . Celebrex [Celecoxib] Other (See Comments)    Other reaction(s): Other flushed  . Darvocet [Propoxyphene N-Acetaminophen] Nausea And Vomiting  . Erythromycin Nausea And Vomiting    Nausea and vomiting   . Hydrocodone Nausea And Vomiting  . Lyrica [Pregabalin] Swelling    Legs swelling   . Macrodantin [Nitrofurantoin] Nausea And Vomiting  . Toradol [Ketorolac Tromethamine] Nausea And Vomiting    Per patient, only PO form causes nausea and vomiting. Can take injection without issue  . Tramadol Nausea And Vomiting  .  Verapamil Nausea And Vomiting    REACTION: intolerance    Review of Systems All review of systems negative except what is listed in the HPI     Objective:    Physical Exam Vitals and nursing note reviewed.  Constitutional:      Appearance: Normal appearance.  HENT:     Head: Normocephalic.  Eyes:     Extraocular Movements: Extraocular movements intact.     Conjunctiva/sclera: Conjunctivae normal.     Pupils: Pupils are equal, round, and reactive to light.  Cardiovascular:     Rate and Rhythm: Normal rate and regular rhythm.     Pulses: Normal pulses.  Pulmonary:     Effort: Pulmonary effort is normal.     Breath sounds: Normal breath sounds.  Musculoskeletal:        General: Tenderness and signs of injury present.     Right shoulder: Normal.     Left shoulder: Tenderness present. No swelling, effusion, bony tenderness or crepitus. Decreased range of motion. Decreased strength. Normal pulse.     Cervical back: Normal range of motion. Tenderness present.     Right knee: Normal.     Left knee: Swelling, bony tenderness and crepitus present. No erythema, ecchymosis or lacerations. Decreased range of motion. Tenderness present over the patellar tendon. No medial joint line, lateral joint line, MCL, LCL, ACL or PCL tenderness. No LCL laxity, MCL laxity, ACL laxity or PCL laxity.Abnormal patellar mobility. Normal pulse.     Right lower leg: No edema.     Left lower  leg: No edema.  Skin:    General: Skin is warm and dry.     Capillary Refill: Capillary refill takes less than 2 seconds.  Neurological:     General: No focal deficit present.     Mental Status: She is alert and oriented to person, place, and time.     Motor: Weakness present.     Coordination: Coordination normal.     Gait: Gait abnormal.  Psychiatric:        Mood and Affect: Mood normal.        Behavior: Behavior normal.        Thought Content: Thought content normal.        Judgment: Judgment normal.     BP  108/69   Pulse (!) 51   Temp 98.1 F (36.7 C)   Ht 5\' 6"  (1.676 m)   Wt 177 lb 11.2 oz (80.6 kg)   SpO2 96%   BMI 28.68 kg/m  Wt Readings from Last 3 Encounters:  10/03/20 177 lb 11.2 oz (80.6 kg)  09/17/20 176 lb 2 oz (79.9 kg)  09/11/20 176 lb (79.8 kg)    There are no preventive care reminders to display for this patient.  There are no preventive care reminders to display for this patient.   Lab Results  Component Value Date   TSH 1.34 05/21/2020   Lab Results  Component Value Date   WBC 5.9 09/09/2020   HGB 11.3 (L) 09/09/2020   HCT 35.3 09/09/2020   MCV 86.9 09/09/2020   PLT 400 09/09/2020   Lab Results  Component Value Date   NA 133 (L) 09/09/2020   K 4.4 09/09/2020   CO2 27 09/09/2020   GLUCOSE 107 09/09/2020   BUN 9 09/09/2020   CREATININE 0.80 09/09/2020   BILITOT 0.3 09/09/2020   ALKPHOS 73 05/21/2020   AST 22 09/09/2020   ALT 20 09/09/2020   PROT 6.3 09/09/2020   ALBUMIN 4.0 05/21/2020   CALCIUM 9.8 09/09/2020   ANIONGAP 12 02/22/2020   GFR 74.39 05/21/2020   Lab Results  Component Value Date   CHOL 128 06/07/2019   Lab Results  Component Value Date   HDL 52 06/07/2019   Lab Results  Component Value Date   LDLCALC 51 06/07/2019   Lab Results  Component Value Date   TRIG 97 11/07/2019   Lab Results  Component Value Date   CHOLHDL 2.5 06/07/2019   Lab Results  Component Value Date   HGBA1C 6.0 (A) 06/11/2020       Assessment & Plan:   1. Acute pain of left knee 2. Injury due to fall, initial encounter 3. Acute pain of left shoulder due to trauma Fall with injury to the left knee and left shoulder since October of this year.  Knee reveals bony tenderness over the knee cap and tenderness noted at the patellar tendon. Crepitus is present with movement and manipulation. Inflammation is present on the medial aspect of the patella. Her gait is altered due to pain with walking.  Left shoulder reveals limited ROM specifically with  lateral movement and internal rotation. Upper extremity is weaker on the left than the right.  Will obtain x-rays today to rule out possible fracture of the joints.  Recommend ibuprofen, ice, and heat to the joints until further information is obtained from imaging.  She will likely need physical therapy if acute fracture is ruled out and informal exercises at home.   Will determine follow-up with Sports Medicine once  results are received.    Orma Render, NP

## 2020-10-04 ENCOUNTER — Other Ambulatory Visit: Payer: Self-pay | Admitting: Family Medicine

## 2020-10-04 ENCOUNTER — Other Ambulatory Visit: Payer: Self-pay | Admitting: Rheumatology

## 2020-10-04 ENCOUNTER — Inpatient Hospital Stay: Admit: 2020-10-04 | Payer: MEDICARE | Primary: Internal Medicine

## 2020-10-04 DIAGNOSIS — Z23 Encounter for immunization: Secondary | ICD-10-CM | POA: Diagnosis not present

## 2020-10-04 NOTE — Progress Notes (Signed)
See shoulder x-ray result

## 2020-10-07 ENCOUNTER — Inpatient Hospital Stay: Admit: 2020-10-07 | Payer: MEDICARE | Primary: Internal Medicine

## 2020-10-07 NOTE — Telephone Encounter (Signed)
Last Visit: 08/09/2020 Next Visit: 01/10/2021 Labs: 09/09/2020 Sodium is mildly low but stable. Rest of CMP WNL. Hemoglobin is low but stable. Rest of CBC WNL.  Current Dose per office note 08/09/2020: Arava 20 mg 1 tablet by mouth daily. DX: Seronegative rheumatoid arthritis  Okay to refill Arava?

## 2020-10-08 ENCOUNTER — Inpatient Hospital Stay: Admit: 2020-10-08 | Payer: MEDICARE | Primary: Internal Medicine

## 2020-10-09 ENCOUNTER — Ambulatory Visit: Payer: Medicare Other

## 2020-10-09 ENCOUNTER — Inpatient Hospital Stay: Admit: 2020-10-09 | Payer: MEDICARE | Primary: Internal Medicine

## 2020-10-09 MED ORDER — LOSARTAN POTASSIUM 50 MG PO TABS: ORAL_TABLET | ORAL | 3 refills | Status: DC

## 2020-10-10 ENCOUNTER — Inpatient Hospital Stay: Admit: 2020-10-10 | Payer: MEDICARE | Primary: Internal Medicine

## 2020-10-11 ENCOUNTER — Telehealth: Payer: Self-pay | Admitting: Family Medicine

## 2020-10-11 ENCOUNTER — Other Ambulatory Visit: Payer: Self-pay

## 2020-10-11 ENCOUNTER — Ambulatory Visit (INDEPENDENT_AMBULATORY_CARE_PROVIDER_SITE_OTHER): Payer: Medicare Other | Admitting: Sports Medicine

## 2020-10-11 ENCOUNTER — Inpatient Hospital Stay: Admit: 2020-10-11 | Payer: MEDICARE | Primary: Internal Medicine

## 2020-10-11 VITALS — BP 117/60 | HR 62

## 2020-10-11 DIAGNOSIS — D510 Vitamin B12 deficiency anemia due to intrinsic factor deficiency: Secondary | ICD-10-CM | POA: Diagnosis not present

## 2020-10-11 MED ORDER — CYANOCOBALAMIN 1000 MCG/ML IJ SOLN
1000.0000 ug | Freq: Once | INTRAMUSCULAR | Status: AC
  Administered 2020-10-11: 1000 ug via INTRAMUSCULAR

## 2020-10-11 NOTE — Progress Notes (Signed)
Pt. Is here for a vItamin B12 injection denies GI, dizziness, sob. Pt. Tolerated well without complication pt. Advised to scheduled next injection in 1 month

## 2020-10-11 NOTE — Telephone Encounter (Signed)
Pt requested that I let SaraBeth know that she is still waiting to hear back from her Xray

## 2020-10-14 ENCOUNTER — Encounter: Payer: MEDICARE | Primary: Internal Medicine

## 2020-10-15 ENCOUNTER — Encounter: Payer: MEDICARE | Primary: Internal Medicine

## 2020-10-15 NOTE — Telephone Encounter (Signed)
Pt aware Mia Longs recommendations. A copy of these recommendations sent to her via MyChart message at her request. She states that she believes she has some meloxicam at home and her daughter will be heading from here to [place] in a few days, so she will have her daughter take it down with her when she goes to [place]. No further questions or concerns at this time.

## 2020-10-15 NOTE — Telephone Encounter (Signed)
Per Emeterio Reeve Early, DNP, AGNP-C:  Cyril Mourning, do you care to call Mrs. Cranford today. It looks like I resulted the x-rays on the 10th and it looks like you were able to verify that she saw the results, but she called on Friday to let us know she hadn't heard anything. I am not sure if she didn't see the MyChart messages or maybe was waiting for a call. Thank you!

## 2020-10-15 NOTE — Telephone Encounter (Signed)
Thank you for calling her and explaining. Please let her know I apologize for the miscommunication, when I saw that she had read my note and recommendations on MyChart, I assumed that would be sufficient in place of a call. In the future I will make sure to clarify.   For her knee pain, osteoarthritis is a chronic condition and will likely results from some pain and swelling to the knee on and off. The wearing of the joint itself causes the pain and swelling.   Some things that can be done for this are:  Stretching exercises, walking, water aerobics, and yoga  Ice to the knee for 20 minutes at a time 2-3 times a day and after exercise.  Supportive knee brace  Used when up walking and exercising, but should be taken off for bed.   Can be purchased over the counter at any pharmacy or on [place].   I recommend ones that strap around the knee as opposed to pulling on like a sleeve as they are easier to get on and off.   Meloxicam every morning with breakfast-  works to reduce the swelling and pain in the joint.   Just let me know what pharmacy to send to.   Diclofenac gel or [place] gel at bedtime   This is available over the counter at the pharmacy and helps with local pain and inflammation.   None of this will cure the pain, but should help reduce it and improve her ability to get around. If her pain improves with the recommendations, then she can continue these. If it does not improve at all, then she will need to make a follow-up appointment with Dr. Darene Lamer when she gets back in town for further recommendations. He may be able to do a joint injection with steroid, which can help with this type of pain.

## 2020-10-15 NOTE — Telephone Encounter (Signed)
Mia [name] (1st attempt)    Results of shoulder and knee x-rays were seen by pt on 10/04/2020 at 10:44 PM. When pt calls back, will review the results with her verbally

## 2020-10-15 NOTE — Telephone Encounter (Signed)
Spoke with pt who stated she did see her results but did not understand them. I read to her the patient result note that Emeterio Reeve sent to her and she said that she saw all of that information, but didn't understand the words in the report. I told her that she was most likely reading the radiologist report, and that when that comes back to [place] that the provider who ordered the test would read the radiologist report and then send the portal message to the patient in layman's terms. She said that she saw the note and said that she is having a hard time walking and that the pain will wake her up at night. I told her that Jomarie Longs recommendation was to see Dr. Darene Lamer for further evaluation. She said that she is in her car on her way to [place] and will be there for 3 weeks. I asked her if she called to make an appt with T but she said she did not because "Emeterio Reeve told me she would call me, so I was waiting on her to call me with the results." So she did see the report and the result note but did not call because she was waiting on [place] to call her as well. She wants to know what can be done to help with the pain. I told her that I would send a message to Emeterio Reeve asking what could be done, but wanted her to know up front that controlled substances cannot be called in across state lines. I asked her if she knew what pharmacy she would be using in [place] and she said she did not know but there is most likely [facility] where she is going. Please advise.

## 2020-10-16 ENCOUNTER — Encounter: Payer: MEDICARE | Primary: Internal Medicine

## 2020-10-17 ENCOUNTER — Encounter: Payer: MEDICARE | Primary: Internal Medicine

## 2020-10-18 DIAGNOSIS — Z20822 Contact with and (suspected) exposure to covid-19: Secondary | ICD-10-CM | POA: Diagnosis not present

## 2020-10-22 LAB — NOVEL CORONAVIRUS (COVID-19): SARS-CoV-2, NAA: NOT DETECTED

## 2020-10-22 LAB — COVID-19: SARS-CoV-2, NAA: NOT DETECTED

## 2020-11-04 ENCOUNTER — Ambulatory Visit: Payer: Medicare Other | Admitting: Podiatry

## 2020-11-04 DIAGNOSIS — E119 Type 2 diabetes mellitus without complications: Secondary | ICD-10-CM | POA: Diagnosis not present

## 2020-11-04 DIAGNOSIS — M19071 Primary osteoarthritis, right ankle and foot: Secondary | ICD-10-CM | POA: Diagnosis not present

## 2020-11-04 DIAGNOSIS — I10 Essential (primary) hypertension: Secondary | ICD-10-CM | POA: Diagnosis not present

## 2020-11-04 DIAGNOSIS — M79604 Pain in right leg: Secondary | ICD-10-CM | POA: Diagnosis not present

## 2020-11-04 DIAGNOSIS — M79661 Pain in right lower leg: Secondary | ICD-10-CM | POA: Diagnosis not present

## 2020-11-04 DIAGNOSIS — E785 Hyperlipidemia, unspecified: Secondary | ICD-10-CM | POA: Diagnosis not present

## 2020-11-04 DIAGNOSIS — M84374A Stress fracture, right foot, initial encounter for fracture: Secondary | ICD-10-CM | POA: Diagnosis not present

## 2020-11-04 DIAGNOSIS — Z7984 Long term (current) use of oral hypoglycemic drugs: Secondary | ICD-10-CM | POA: Diagnosis not present

## 2020-11-07 ENCOUNTER — Ambulatory Visit: Payer: Medicare Other | Admitting: Podiatry

## 2020-11-07 ENCOUNTER — Telehealth: Payer: Self-pay | Admitting: *Deleted

## 2020-11-07 NOTE — Telephone Encounter (Signed)
Patient is wanting advise on right leg pain but would not like to schedule an appointment. She has seen several doctors about the leg. Informed her that I could not give any information about how to treat the leg without being seen.last office since 2018.Verbalized understanding and said thank you.

## 2020-11-08 ENCOUNTER — Ambulatory Visit: Payer: Medicare Other

## 2020-11-08 ENCOUNTER — Ambulatory Visit: Payer: Medicare Other | Admitting: Podiatry

## 2020-11-08 DIAGNOSIS — M84376D Stress fracture, unspecified foot, subsequent encounter for fracture with routine healing: Secondary | ICD-10-CM | POA: Diagnosis not present

## 2020-11-08 DIAGNOSIS — M48062 Spinal stenosis, lumbar region with neurogenic claudication: Secondary | ICD-10-CM | POA: Diagnosis not present

## 2020-11-08 DIAGNOSIS — M47812 Spondylosis without myelopathy or radiculopathy, cervical region: Secondary | ICD-10-CM | POA: Diagnosis not present

## 2020-11-11 ENCOUNTER — Ambulatory Visit: Payer: Medicare Other

## 2020-11-11 ENCOUNTER — Ambulatory Visit: Payer: Medicare Other | Admitting: Psychology

## 2020-11-12 ENCOUNTER — Ambulatory Visit: Payer: Medicare Other | Admitting: Psychology

## 2020-11-14 ENCOUNTER — Ambulatory Visit: Payer: Medicare Other | Admitting: Psychology

## 2020-11-14 DIAGNOSIS — M25462 Effusion, left knee: Secondary | ICD-10-CM | POA: Diagnosis not present

## 2020-11-14 DIAGNOSIS — S0121XA Laceration without foreign body of nose, initial encounter: Secondary | ICD-10-CM | POA: Diagnosis not present

## 2020-11-14 DIAGNOSIS — S199XXA Unspecified injury of neck, initial encounter: Secondary | ICD-10-CM | POA: Diagnosis not present

## 2020-11-14 DIAGNOSIS — S0990XA Unspecified injury of head, initial encounter: Secondary | ICD-10-CM | POA: Diagnosis not present

## 2020-11-14 DIAGNOSIS — S0993XA Unspecified injury of face, initial encounter: Secondary | ICD-10-CM | POA: Diagnosis not present

## 2020-11-14 DIAGNOSIS — M810 Age-related osteoporosis without current pathological fracture: Secondary | ICD-10-CM | POA: Diagnosis not present

## 2020-11-14 DIAGNOSIS — S0181XA Laceration without foreign body of other part of head, initial encounter: Secondary | ICD-10-CM | POA: Diagnosis not present

## 2020-11-14 DIAGNOSIS — M25561 Pain in right knee: Secondary | ICD-10-CM | POA: Diagnosis not present

## 2020-11-14 DIAGNOSIS — M25562 Pain in left knee: Secondary | ICD-10-CM | POA: Diagnosis not present

## 2020-11-14 DIAGNOSIS — M549 Dorsalgia, unspecified: Secondary | ICD-10-CM | POA: Diagnosis not present

## 2020-11-14 DIAGNOSIS — R519 Headache, unspecified: Secondary | ICD-10-CM | POA: Diagnosis not present

## 2020-11-14 DIAGNOSIS — Z96651 Presence of right artificial knee joint: Secondary | ICD-10-CM | POA: Diagnosis not present

## 2020-11-14 DIAGNOSIS — M4312 Spondylolisthesis, cervical region: Secondary | ICD-10-CM | POA: Diagnosis not present

## 2020-11-15 ENCOUNTER — Telehealth: Payer: Self-pay | Admitting: *Deleted

## 2020-11-18 DIAGNOSIS — M84376D Stress fracture, unspecified foot, subsequent encounter for fracture with routine healing: Secondary | ICD-10-CM | POA: Diagnosis not present

## 2020-11-18 DIAGNOSIS — D510 Vitamin B12 deficiency anemia due to intrinsic factor deficiency: Secondary | ICD-10-CM | POA: Diagnosis not present

## 2020-11-19 ENCOUNTER — Other Ambulatory Visit: Payer: Self-pay

## 2020-11-19 ENCOUNTER — Encounter: Payer: Self-pay | Admitting: Physician Assistant

## 2020-11-19 ENCOUNTER — Ambulatory Visit (INDEPENDENT_AMBULATORY_CARE_PROVIDER_SITE_OTHER): Payer: Medicare Other | Admitting: Physician Assistant

## 2020-11-19 ENCOUNTER — Ambulatory Visit: Payer: Medicare Other

## 2020-11-19 VITALS — BP 124/75 | HR 85 | Temp 98.4°F | Ht 66.0 in | Wt 175.0 lb

## 2020-11-19 DIAGNOSIS — W19XXXD Unspecified fall, subsequent encounter: Secondary | ICD-10-CM

## 2020-11-19 DIAGNOSIS — J3489 Other specified disorders of nose and nasal sinuses: Secondary | ICD-10-CM | POA: Diagnosis not present

## 2020-11-19 DIAGNOSIS — S0121XD Laceration without foreign body of nose, subsequent encounter: Secondary | ICD-10-CM | POA: Diagnosis not present

## 2020-11-19 DIAGNOSIS — E538 Deficiency of other specified B group vitamins: Secondary | ICD-10-CM

## 2020-11-19 DIAGNOSIS — E232 Diabetes insipidus: Secondary | ICD-10-CM

## 2020-11-19 DIAGNOSIS — E871 Hypo-osmolality and hyponatremia: Secondary | ICD-10-CM | POA: Diagnosis not present

## 2020-11-19 DIAGNOSIS — Z20822 Contact with and (suspected) exposure to covid-19: Secondary | ICD-10-CM | POA: Diagnosis not present

## 2020-11-19 DIAGNOSIS — R631 Polydipsia: Secondary | ICD-10-CM

## 2020-11-19 DIAGNOSIS — R52 Pain, unspecified: Secondary | ICD-10-CM | POA: Diagnosis not present

## 2020-11-19 DIAGNOSIS — Z4802 Encounter for removal of sutures: Secondary | ICD-10-CM

## 2020-11-19 LAB — VITAMIN B12: Vitamin B-12: 322 pg/mL (ref 200–1100)

## 2020-11-19 MED ORDER — IPRATROPIUM BROMIDE 0.03 % NA SOLN: 2.0000 | Freq: Two times a day (BID) | NASAL | 1 refills | Status: DC

## 2020-11-19 MED ORDER — CYANOCOBALAMIN 1000 MCG/ML IJ SOLN
1000.0000 ug | Freq: Once | INTRAMUSCULAR | Status: AC
  Administered 2020-11-19: 1000 ug via INTRAMUSCULAR

## 2020-11-19 NOTE — Progress Notes (Addendum)
Acute Office Visit  Subjective:    Patient ID: Mia [name], female    DOB: 1951/06/09, 70 y.o.   MRN: [id number]  Chief Complaint  Patient presents with  . Fall    Patient presents 5 day post fall for removal of 3 sutures on nasal ridge.  She states that she fell last Thursday while pushing a shopping cart landing on her face.  She was seen same day in an ER in [place], and concussion/hemorrhage were ruled out with CT scan.  3 sutures were placed along her nasal ridge.  She reports feeling tender around her shoulders and left knee.    She now reports 3 day history of feeling achy all over her body, nasal congestion, one mild nose bleed yesterday, and a sore throat.  She does have chronic "runny nose" for which she takes an intranasal flonase.  She also reports that several members of her family have been getting sick recently, and although she hasn't been directly exposed to a positive case, one of her relatives that she just visited is sick but untested.    Additionally, 1 month ago on a trip to [place], she was diagnosed with a calcaneal bone spur and stress fx of her right foot.  She has seen [facility] (Dr. Ihor Gully) for her right foot fx, and plans to see him tomorrow for her knee, which has been giving her trouble since her fall.    Fall Pertinent negatives include no abdominal pain, fever, hematuria, nausea, numbness or vomiting.     Past Medical History:  Diagnosis Date  . Anal fissure   . Anemia   . Arthritis   . Blood transfusion   . C. difficile colitis   . Chest pain    a. 01/2013 MV: EF 59%, no ischemia.  . Chronic Dyspnea    a. 01/2013 Echo: EF 60-65%, Gr 1 DD, PASP 32mmHg.// Echo 01/2020: EF 60-65, no RWMA, GR 1 DD, GLS -21.6%, normal RV SF, trivial MR   . COVID-19   . DDD (degenerative disc disease), cervical   . DDD (degenerative disc disease), lumbar   . Diabetes mellitus   . Fibromyalgia   . Gall stones   . GERD (gastroesophageal reflux disease)     gastritis  . Hemorrhoids   . Hypertension   . Interstitial cystitis   . MCI (mild cognitive impairment) 11/25/2017  . Memory difficulty 12/14/2013  . Neuromuscular disorder (HCC)    sclerosis  . Osteoporosis   . Peptic ulcer   . PONV (postoperative nausea and vomiting)   . Pseudogout   . Raynaud phenomenon   . Rectal bleeding   . Sleep apnea    a. on cpap.  Marland Kitchen SVT (supraventricular tachycardia) (HCC)   . Syncope 09/10/2015  . Thyroid disease    hypothyroidism  . Tremor, essential 05/14/2016    Past Surgical History:  Procedure Laterality Date  . APPENDECTOMY    . BLADDER SURGERY     x2  . BLADDER SURGERY    . BREAST BIOPSY    . CARDIAC CATHETERIZATION    . CARPAL TUNNEL RELEASE    . CATARACT EXTRACTION Bilateral   . CHOLECYSTECTOMY    . DILATION AND CURETTAGE OF UTERUS    . ENTEROCELE REPAIR     x2  . EYE SURGERY     retina  . hysterectomy - unknown type    . JOINT REPLACEMENT    . KNEE ARTHROPLASTY    . KNEE SURGERY  x6  . NECK SURGERY     fusion  . OOPHORECTOMY    . QUADRICEPS REPAIR Right   . RECTOCELE REPAIR     x2  . SHOULDER ARTHROSCOPY WITH SUBACROMIAL DECOMPRESSION, ROTATOR CUFF REPAIR AND BICEP TENDON REPAIR  10/06/2012   Procedure: SHOULDER ARTHROSCOPY WITH SUBACROMIAL DECOMPRESSION, ROTATOR CUFF REPAIR AND BICEP TENDON REPAIR;  Surgeon: Nita Sells, MD;  Location: [facility];  Service: Orthopedics;  Laterality: Right;  Arthroscopic  Repair  of  Subscapularis, Open Biceps Tenodesis  . SHOULDER SURGERY     bilateral- bones spur  . TONSILLECTOMY    . TOTAL KNEE ARTHROPLASTY    . TOTAL SHOULDER ARTHROPLASTY      Family History  Problem Relation Age of Onset  . Heart attack Mother   . Stroke Mother   . Diabetes Mother   . Heart disease Mother   . Colon polyps Mother   . Asthma Mother   . Breast cancer Maternal Grandmother   . Wilson's disease Maternal Grandmother   . Pancreatic cancer Maternal Grandfather   . Heart  disease Father   . Heart attack Father   . Asthma Father   . Stroke Sister   . Hypertension Sister   . Rheum arthritis Sister   . Dementia Sister   . Asthma Sister   . Lupus Sister   . Asthma Sister     Social History   Socioeconomic History  . Marital status: Married    Spouse name: Not on file  . Number of children: 2  . Years of education: 61  . Highest education level: Not on file  Occupational History  . Occupation: Retired  Tobacco Use  . Smoking status: Never Smoker  . Smokeless tobacco: Never Used  Vaping Use  . Vaping Use: Never used  Substance and Sexual Activity  . Alcohol use: No    Alcohol/week: 0.0 standard drinks  . Drug use: No  . Sexual activity: Not on file  Other Topics Concern  . Not on file  Social History Narrative   Patient drinks about 3-4 cups of caffeine daily.   Patient is right handed.   Social Determinants of Health   Financial Resource Strain: Not on file  Food Insecurity: Not on file  Transportation Needs: Not on file  Physical Activity: Not on file  Stress: Not on file  Social Connections: Not on file  Intimate Partner Violence: Not on file    Outpatient Medications Prior to Visit  Medication Sig Dispense Refill  . meloxicam (MOBIC) 15 MG tablet Take by mouth.    Marland Kitchen amLODipine (NORVASC) 5 MG tablet Take 1 tablet (5 mg total) by mouth daily. 90 tablet 3  . colestipol (COLESTID) 1 g tablet     . Cyanocobalamin (VITAMIN B-12 IJ) Inject 1 mL as directed every 30 (thirty) days.     Marland Kitchen denosumab (PROLIA) 60 MG/ML SOLN injection Inject 60 mg into the skin every 6 (six) months. Administer in upper arm, thigh, or abdomen    . donepezil (ARICEPT) 10 MG tablet TAKE 1 TABLET AT BEDTIME 90 tablet 3  . fluticasone (FLONASE) 50 MCG/ACT nasal spray Place 2 sprays into both nostrils daily. 16 g 6  . gabapentin (NEURONTIN) 300 MG capsule TAKE 1 TO 2 CAPSULES BY MOUTH UP TO TWO TIMES DAILY AS NEEDED 180 capsule 3  . leflunomide (ARAVA) 20 MG  tablet TAKE ONE TABLET BY MOUTH DAILY 30 tablet 2  . losartan (COZAAR) 50 MG tablet  Take 2 tablets (100 mg total) by mouth daily. Take 1 tablet in the morning and 1 tablet in evening as needed for blood pressure greater than 150 180 tablet 3  . Melatonin 10 MG CAPS Take 10 capsules by mouth daily.    . memantine (NAMENDA) 10 MG tablet TAKE 1 TABLET TWICE DAILY 180 tablet 4  . metFORMIN (GLUCOPHAGE-XR) 500 MG 24 hr tablet TAKE 2 TABLETS EVERY DAY WITH BREAKFAST 180 tablet 1  . metoprolol tartrate (LOPRESSOR) 100 MG tablet TAKE 1 AND 1/2 TABLETS TWICE DAILY 270 tablet 3  . ondansetron (ZOFRAN-ODT) 8 MG disintegrating tablet Take 1 tablet by mouth as needed for nausea.    . pantoprazole (PROTONIX) 40 MG tablet Take 40 mg by mouth 2 (two) times daily.    . polyethylene glycol powder (GLYCOLAX/MIRALAX) 17 GM/SCOOP powder Take 17 g by mouth daily as needed for mild constipation or moderate constipation.     . primidone (MYSOLINE) 50 MG tablet TAKE 1 TABLET AT BEDTIME 90 tablet 1  . venlafaxine XR (EFFEXOR-XR) 75 MG 24 hr capsule TAKE 1 CAPSULE EVERY DAY WITH BREAKFAST 90 capsule 3   No facility-administered medications prior to visit.    Allergies  Allergen Reactions  . Codeine Nausea And Vomiting  . Myrbetriq  [Mirabegron] Other (See Comments)    SEVERE HEADACHE  . Percocet [Oxycodone-Acetaminophen] Nausea And Vomiting  . Propoxyphene Nausea And Vomiting  . Nystatin   . Celebrex [Celecoxib] Other (See Comments)    Other reaction(s): Other flushed  . Darvocet [Propoxyphene N-Acetaminophen] Nausea And Vomiting  . Erythromycin Nausea And Vomiting    Nausea and vomiting   . Hydrocodone Nausea And Vomiting  . Lyrica [Pregabalin] Swelling    Legs swelling   . Macrodantin [Nitrofurantoin] Nausea And Vomiting  . Toradol [Ketorolac Tromethamine] Nausea And Vomiting    Per patient, only PO form causes nausea and vomiting. Can take injection without issue  . Tramadol Nausea And Vomiting  .  Verapamil Nausea And Vomiting    REACTION: intolerance    Review of Systems  Constitutional: Negative for appetite change, chills and fever.  HENT: Positive for nosebleeds, postnasal drip, rhinorrhea and sore throat. Negative for ear pain.   Eyes: Negative for redness and itching.  Respiratory: Negative for cough, chest tightness and shortness of breath.   Cardiovascular: Negative for chest pain and palpitations.  Gastrointestinal: Negative for abdominal distention, abdominal pain, constipation, diarrhea, nausea and vomiting.  Endocrine: Negative for polydipsia and polyuria.  Genitourinary: Negative for dysuria, flank pain and hematuria.  Musculoskeletal: Positive for arthralgias, back pain, joint swelling and myalgias. Negative for gait problem.  Skin: Positive for wound. Negative for pallor and rash.  Neurological: Negative for tremors, syncope and numbness.  Psychiatric/Behavioral: Negative for confusion and self-injury. The patient is not nervous/anxious.        Objective:    Physical Exam Constitutional:      General: She is not in acute distress.    Appearance: Normal appearance.  HENT:     Head: Normocephalic.     Nose: Laceration, congestion and rhinorrhea present. No nasal deformity or septal deviation.     Right Nostril: No foreign body or epistaxis.     Left Nostril: No foreign body.     Right Turbinates: Enlarged and swollen.   Eyes:     Extraocular Movements: Extraocular movements intact.     Pupils: Pupils are equal, round, and reactive to light.  Cardiovascular:     Rate and Rhythm: Normal  rate and regular rhythm.     Pulses: Normal pulses.     Heart sounds: Normal heart sounds.  Pulmonary:     Effort: Pulmonary effort is normal.     Breath sounds: Normal breath sounds. No wheezing, rhonchi or rales.  Abdominal:     General: Abdomen is flat. There is no distension.  Musculoskeletal:        General: Tenderness present. Normal range of motion.     Cervical  back: Normal range of motion and neck supple.       Legs:     Comments: R foot placed in boot.    Skin:    General: Skin is warm and dry.     Capillary Refill: Capillary refill takes less than 2 seconds.  Neurological:     General: No focal deficit present.     Mental Status: She is alert and oriented to person, place, and time.  Psychiatric:        Mood and Affect: Mood normal.        Behavior: Behavior normal.     BP 124/75   Pulse 85   Temp 98.4 F (36.9 C) (Oral)   Ht 5\' 6"  (1.676 m)   Wt 79.4 kg Comment: has boot on foot  SpO2 98%   BMI 28.25 kg/m  Wt Readings from Last 3 Encounters:  11/19/20 79.4 kg  10/03/20 80.6 kg  09/17/20 79.9 kg   3 sutures removed from nasal bridge.      Assessment & Plan:  Marland Kitchen[place]land Kitchen[place] was seen today for fall.  Diagnoses and all orders for this visit:  Visit for suture removal  B12 deficiency -     cyanocobalamin ((VITAMIN B-12)) injection 1,000 mcg  Fall, subsequent encounter  Body aches -     Novel Coronavirus, NAA ([facility])  Hyponatremia -     COMPLETE METABOLIC PANEL WITH GFR  Rhinorrhea -     Novel Coronavirus, NAA ([facility])  Close exposure to COVID-19 virus -     Novel Coronavirus, NAA ([facility])  Laceration of nose, subsequent encounter  Other orders -     ipratropium (ATROVENT) 0.03 % nasal spray; Place 2 sprays into both nostrils every 12 (twelve) hours.    Laceration on nasal ridge, follow up:  3 sutures were removed.  Pt educated that injury should continue to heal, but to follow up with PCP if worsening of injury or recurrent bleeding from injury or epistaxis in next month.    Nasal congestion/Sore throat:  Swabbed for COVID in office, results pending.  Continue conservative treatment with rest and hydration.  Patient encouraged to isolate until test results are known.  Additionally, patient reports year round nasal drainage and use of Flonase.  Allergic rhinitis is likely, patient encouraged to continue use of  intranasal steroid and can add OTC antihistamine such as cetirizine.    Stress fx of right foot/calcaneal bone spur:  Patient is currently under care of [facility] (Dr. Jetta Lout) and should follow up with provider as previously recommended.   Swelling of L knee, point tenderness on lateral aspect/point tenderness on medial aspect of R knee:  Follow up with [facility] as scheduled for 1/26.  Right knee pain previously documented as secondary to chronic osteoarthritis in area.    Health maintenance/Hyponatremia:  Follow up with PCP and Endocrinology as scheduled, with recheck of electrolyte levels every 3 months. Keep to 64oz of fluid a day.    Marland KitchenVernetta Honey PA-C, have reviewed and agree with  the above documentation in it's entirety.

## 2020-11-19 NOTE — Patient Instructions (Signed)
Suture Removal, Care After This sheet gives you information about how to care for yourself after your procedure. Your health care provider may also give you more specific instructions. If you have problems or questions, contact your health care provider. What can I expect after the procedure? After your stitches (sutures) are removed, it is common to have:  Some discomfort and swelling in the area.  Slight redness in the area. Follow these instructions at home: If you have a bandage:  Wash your hands with soap and water before you change your bandage (dressing). If soap and water are not available, use hand sanitizer.  Change your dressing as told by your health care provider. If your dressing becomes wet or dirty, or develops a bad smell, change it as soon as possible.  If your dressing sticks to your skin, soak it in warm water to loosen it. Wound care  Check your wound every day for signs of infection. Check for: ? More redness, swelling, or pain. ? Fluid or blood. ? Warmth. ? Pus or a bad smell.  Wash your hands with soap and water before and after touching your wound.  Apply cream or ointment only as directed by your health care provider. If you are using cream or ointment, wash the area with soap and water 2 times a day to remove all the cream or ointment. Rinse off the soap and pat the area dry with a clean towel.  If you have skin glue or adhesive strips on your wound, leave these closures in place. They may need to stay in place for 2 weeks or longer. If adhesive strip edges start to loosen and curl up, you may trim the loose edges. Do not remove adhesive strips completely unless your health care provider tells you to do that.  Keep the wound area dry and clean. Do not take baths, swim, or use a hot tub until your health care provider approves.  Continue to protect the wound from injury.  Do not pick at your wound. Picking can cause an infection.  When your wound has  completely healed, wear sunscreen over it or cover it with clothing when you are outside. New scars get sunburned easily, which can make scarring worse.   General instructions  Take over-the-counter and prescription medicines only as told by your health care provider.  Keep all follow-up visits as told by your health care provider. This is important. Contact a health care provider if:  You have redness, swelling, or pain around your wound.  You have fluid or blood coming from your wound.  Your wound feels warm to the touch.  You have pus or a bad smell coming from your wound.  Your wound opens up. Get help right away if:  You have a fever.  You have redness that is spreading from your wound. Summary  After your sutures are removed, it is common to have some discomfort and swelling in the area.  Wash your hands with soap and water before you change your bandage (dressing).  Keep the wound area dry and clean. Do not take baths, swim, or use a hot tub until your health care provider approves. This information is not intended to replace advice given to you by your health care provider. Make sure you discuss any questions you have with your health care provider. Document Revised: 08/09/2020 Document Reviewed: 08/09/2020 Elsevier Patient Education  2021 [employer].

## 2020-11-20 ENCOUNTER — Encounter: Payer: Self-pay | Admitting: Cardiovascular Disease

## 2020-11-20 DIAGNOSIS — M25561 Pain in right knee: Secondary | ICD-10-CM | POA: Diagnosis not present

## 2020-11-20 DIAGNOSIS — M25562 Pain in left knee: Secondary | ICD-10-CM | POA: Diagnosis not present

## 2020-11-20 NOTE — Progress Notes (Signed)
Cardiology Office Note   Date:  11/21/2020   ID:  Mia [name] Current, DOB July 22, 1951, MRN [id number]  PCP:  Mia Marry, MD  Cardiologist:   Mia Moores, MD   Chief Complaint  Patient presents with  . Hypertension   1. SVT 2. Raynaud's Phenomenon 3. Diabetes Mellitus 4. Minimal CAD by cath 2006 Mia [name]) 5. Sleep apnea - CPAP is currently not working  02/11/05 Right heart catheterization:  RA pressure: 8 mmHg  RV pressure: 25/7 mmHg  PA pressure: 20/9 mmHg  PWCP: 9 mmHg  CO: 4.6 liters per minute  LEFT HEART CATHETERIZATION RESULTS:  1. Left main: No significant disease.  2. LAD: Mild luminal irregularities.  3. First and second diagonal: Moderate size, mild luminal irregularities.  4. Left circumflex: Nondominant, mild luminal irregularities.  5. RCA: Dominant with mild luminal irregularities.  6. LV: EF is 55 to 60%, no wall motion abnormalities. LV EDP was 9 mmHg.   December 28, 2014:  Mia [name] is a 70 y.o. female who presents for follow-up with an episode of chest pain. She was seen by our nurse practitioner, Mia [name].    Diltiazem was DC'd.   She was started on Metoprolol 50 bid.   Myoview study was normal. She has normal left ventricle systolic function. She is going to the [facility] 3 times a week.  Doing well.     She is feeling much better.    Sept. 1, 2016:  Has had C diff for the past several months .  Doing well from a cardiac standpoint .  Has been fatigued.  Doing well with the metoprolol  Still has shortness of breath .   Nov. 3, 2017:    Mia [name] is seen today with her husband, Mia [name].   Last week she started having some heart facing .  HR was 187 (using the app on her phone )  Could feel it in her ears. Got a little dizzy.  Sat down and relaxed and the episode resolved.  Had another episode this past Sunday .  HR stayed elevated for 2 hours   Aug. 24, 2018: Mia [name] is seen today with husband Mia [name].   Followed for HTN and  hyperlipidemia  Has had some elevated diastolic BP readings . Eats some salty foods still - likes ham. Complains of leg pain / ache when she is standing .    Nov. 16, 2018  Mia [name] was having more SVT, we have increased the metoprolol  Up to metoprolol 100 mg BID. She is much better controlled at this point Wearing the event monitor   December 08, 2017:  Mia [name] is seen today for follow-up of her supraventricular tachycardia  We had increased her metoprolol to 100 mg BID  Has occasional palpitations  No CP ,  No severe dyspnea. Does has some DOE that is likely due to generalized deconditioning  Needs left knee replacement this summer. Needs carple tunnel surgery in both wrists.  Has episodes of diaphoresis   Mar 11, 2018: Mia [name] is seen today for follow-up of her supraventricular tachycardia.  We increased her metoprolol up to 150 mill grams twice a day during her last visit. Tolerating the metoprolol well  BP is still is elevated at times.     Eats lots of sandwiches, salami,  Her diet is "terrible"  According to her . - eats snacks instead of real meals, sandwiches,   She is surprised that she hasnt lost any weight .   Nov.  4,, 2019:  [name] is here follow-up with her supraventricular tachycardia and hypertension. Palpitations have improved . BP is still elevated.    Avoids salt for the most part  Still eats Nabs crackers regularly   Feb. 9. 2021  Mia [name] is seen today for follow-up visit. She has a history of supraventricular tachycardia.  She has a history of sleep apnea and is on CPAP. She tested positive for Covid on November 07, 2019.  She had symptoms of diarrhea, nausea, vomiting and inability to take p.o. food.  She was in [facility] for 6 days.  BP was elevated in the hospital  Was started on amlodipine.   Doubled her Losartan ( was to take at night)  She was seen by Mia Ades, NP on February 1  When she saw Mia [name]'s last week she was having problems with  hypertension.  Amlodipine had been added. She continued to have lots of fatigue.  BP is  Is eating and drinking normally   April 19, 2020:  Mia [name] is seen today for follow-up of her hypertension and history of SVT.  She tested positive for Covid in January, 2021.  She has been gradually improving.  She had significant leg edema and dysnea .  Saw her primary MD who gave her some IV lasix and started HCTZ.  She felt better after diuresing 20-25 lbs.  Still having episodes of tachycardia - despite being on metoprolol 150 mg BID  We discussed referral to EP for further eval of this   Jan. 27, 2022: Mia [name] is seen back for follow up of her SVT and HTN She has been seen by Dr. Lovena Le of EP who determined that she did not seem to be having significant episodes of SVT.   Most of her episodes occurred with exertion ( and were likely sinus tach)   Dr. Lovena Le thinks she might be a good candidate for ablation  Wanted to wait and get BP down.  He added amlodipine and increased losartan  Is avoiding salt  Has lost weight ,   Her appetite has not been very good recently  Has knee arthritis which is limited her now   Has had occasional episodes  Of SVT  Is on a high dose of metoprolol    Past Medical History:  Diagnosis Date  . Anal fissure   . Anemia   . Arthritis   . Blood transfusion   . C. difficile colitis   . Chest pain    a. 01/2013 MV: EF 59%, no ischemia.  . Chronic Dyspnea    a. 01/2013 Echo: EF 60-65%, Gr 1 DD, PASP 36mmHg.// Echo 01/2020: EF 60-65, no RWMA, GR 1 DD, GLS -21.6%, normal RV SF, trivial MR   . COVID-19   . DDD (degenerative disc disease), cervical   . DDD (degenerative disc disease), lumbar   . Diabetes mellitus   . Fibromyalgia   . Gall stones   . GERD (gastroesophageal reflux disease)    gastritis  . Hemorrhoids   . Hypertension   . Interstitial cystitis   . MCI (mild cognitive impairment) 11/25/2017  . Memory difficulty 12/14/2013  . Neuromuscular disorder  ([place])    sclerosis  . Osteoporosis   . Peptic ulcer   . PONV (postoperative nausea and vomiting)   . Pseudogout   . Raynaud phenomenon   . Rectal bleeding   . Sleep apnea    a. on cpap.  Marland Kitchen SVT (supraventricular tachycardia) ([place])   . Syncope 09/10/2015  .  Thyroid disease    hypothyroidism  . Tremor, essential 05/14/2016    Past Surgical History:  Procedure Laterality Date  . APPENDECTOMY    . BLADDER SURGERY     x2  . BLADDER SURGERY    . BREAST BIOPSY    . CARDIAC CATHETERIZATION    . CARPAL TUNNEL RELEASE    . CATARACT EXTRACTION Bilateral   . CHOLECYSTECTOMY    . DILATION AND CURETTAGE OF UTERUS    . ENTEROCELE REPAIR     x2  . EYE SURGERY     retina  . hysterectomy - unknown type    . JOINT REPLACEMENT    . KNEE ARTHROPLASTY    . KNEE SURGERY     x6  . NECK SURGERY     fusion  . OOPHORECTOMY    . QUADRICEPS REPAIR Right   . RECTOCELE REPAIR     x2  . SHOULDER ARTHROSCOPY WITH SUBACROMIAL DECOMPRESSION, ROTATOR CUFF REPAIR AND BICEP TENDON REPAIR  10/06/2012   Procedure: SHOULDER ARTHROSCOPY WITH SUBACROMIAL DECOMPRESSION, ROTATOR CUFF REPAIR AND BICEP TENDON REPAIR;  Surgeon: Nita Sells, MD;  Location: [facility];  Service: Orthopedics;  Laterality: Right;  Arthroscopic  Repair  of  Subscapularis, Open Biceps Tenodesis  . SHOULDER SURGERY     bilateral- bones spur  . TONSILLECTOMY    . TOTAL KNEE ARTHROPLASTY    . TOTAL SHOULDER ARTHROPLASTY       Current Outpatient Medications  Medication Sig Dispense Refill  . amLODipine (NORVASC) 5 MG tablet Take 1 tablet (5 mg total) by mouth daily. 90 tablet 3  . Cyanocobalamin (VITAMIN B-12 IJ) Inject 1 mL as directed every 30 (thirty) days.     Marland Kitchen denosumab (PROLIA) 60 MG/ML SOLN injection Inject 60 mg into the skin every 6 (six) months. Administer in upper arm, thigh, or abdomen    . donepezil (ARICEPT) 10 MG tablet TAKE 1 TABLET AT BEDTIME 90 tablet 3  . fluticasone (FLONASE) 50  MCG/ACT nasal spray Place 2 sprays into both nostrils daily. 16 g 6  . gabapentin (NEURONTIN) 300 MG capsule TAKE 1 TO 2 CAPSULES BY MOUTH UP TO TWO TIMES DAILY AS NEEDED 180 capsule 3  . ipratropium (ATROVENT) 0.03 % nasal spray Place 2 sprays into both nostrils every 12 (twelve) hours. 30 mL 1  . leflunomide (ARAVA) 20 MG tablet TAKE ONE TABLET BY MOUTH DAILY 30 tablet 2  . losartan (COZAAR) 50 MG tablet Take 2 tablets (100 mg total) by mouth daily. Take 1 tablet in the morning and 1 tablet in evening as needed for blood pressure greater than 150 180 tablet 3  . Melatonin 10 MG CAPS Take 10 capsules by mouth daily.    . meloxicam (MOBIC) 15 MG tablet Take by mouth.    . memantine (NAMENDA) 10 MG tablet TAKE 1 TABLET TWICE DAILY 180 tablet 4  . metFORMIN (GLUCOPHAGE-XR) 500 MG 24 hr tablet TAKE 2 TABLETS EVERY DAY WITH BREAKFAST 180 tablet 1  . metoprolol tartrate (LOPRESSOR) 100 MG tablet TAKE 1 AND 1/2 TABLETS TWICE DAILY 270 tablet 3  . ondansetron (ZOFRAN-ODT) 8 MG disintegrating tablet Take 1 tablet by mouth as needed for nausea.    . pantoprazole (PROTONIX) 40 MG tablet Take 40 mg by mouth 2 (two) times daily.    . polyethylene glycol powder (GLYCOLAX/MIRALAX) 17 GM/SCOOP powder Take 17 g by mouth daily as needed for mild constipation or moderate constipation.     . primidone (MYSOLINE)  50 MG tablet TAKE 1 TABLET AT BEDTIME 90 tablet 1  . venlafaxine XR (EFFEXOR-XR) 75 MG 24 hr capsule TAKE 1 CAPSULE EVERY DAY WITH BREAKFAST 90 capsule 3   No current facility-administered medications for this visit.    Allergies:   Codeine, Myrbetriq  [mirabegron], Percocet [oxycodone-acetaminophen], Propoxyphene, Nystatin, Celebrex [celecoxib], Darvocet [propoxyphene n-acetaminophen], Erythromycin, Hydrocodone, Lyrica [pregabalin], Macrodantin [nitrofurantoin], Toradol [ketorolac tromethamine], Tramadol, and Verapamil    Social History:  The patient  reports that she has never smoked. She has never used  smokeless tobacco. She reports that she does not drink alcohol and does not use drugs.   Family History:  The patient's family history includes Asthma in her father, mother, sister, and sister; Breast cancer in her maternal grandmother; Colon polyps in her mother; Dementia in her sister; Diabetes in her mother; Heart attack in her father and mother; Heart disease in her father and mother; Hypertension in her sister; Lupus in her sister; Pancreatic cancer in her maternal grandfather; Rheum arthritis in her sister; Stroke in her mother and sister; Wilson's disease in her maternal grandmother.    ROS: Noted in current history, all other systems are negative.  Physical Exam: Blood pressure 134/72, pulse 64, height 5\' 6"  (1.676 m), weight 170 lb (77.1 kg), SpO2 97 %.  GEN:  Well nourished, well developed in no acute distress HEENT: Normal NECK: No JVD; No carotid bruits LYMPHATICS: No lymphadenopathy CARDIAC: RRR , no murmurs, rubs, gallops RESPIRATORY:  Clear to auscultation without rales, wheezing or rhonchi  ABDOMEN: Soft, non-tender, non-distended MUSCULOSKELETAL:  No edema; No deformity  SKIN: Warm and dry NEUROLOGIC:  Alert and oriented x 3  EKG:        Recent Labs: 01/23/2020: NT-Pro BNP 119 02/22/2020: B Natriuretic Peptide 28.0 05/21/2020: TSH 1.34 09/09/2020: ALT 20; BUN 9; Creat 0.80; Hemoglobin 11.3; Platelets 400; Potassium 4.4; Sodium 133    Lipid Panel    Component Value Date/Time   CHOL 128 06/07/2019 0934   TRIG 97 11/07/2019 0004   HDL 52 06/07/2019 0934   CHOLHDL 2.5 06/07/2019 0934   CHOLHDL 3.2 08/28/2016 1558   VLDL 22 08/28/2016 1558   LDLCALC 51 06/07/2019 0934      Wt Readings from Last 3 Encounters:  11/21/20 170 lb (77.1 kg)  11/19/20 175 lb (79.4 kg)  10/03/20 177 lb 11.2 oz (80.6 kg)      Other studies Reviewed: Additional studies/ records that were reviewed today include: . Review of the above records demonstrates:    ASSESSMENT AND  PLAN:  1. SVT -        Madelena still has episodes of tachycardia that is likely due to a short burst of SVT.  She is on a high dose of metoprolol.  She will return to see Dr. Lovena Le in approximately month.  Her blood pressure is much better.  He still considering SVT ablation.   2.  Hypertension:    Blood pressure is much better on the edition of amlodipine.  Continue losartan.  4. Minimal CAD by cath 2006 Mia [name]):      6. Dyspnea on exertion:   I think a good bit of her dyspnea on exertion is due to deconditioning.  I encouraged her to work on getting back into an exercise program once her foot and other orthopedic issues are better.  Current medicines are reviewed at length with the patient today.  The patient does not have concerns regarding medicines.  The following changes have been made:  no change  Disposition:   FU with me in 1 year     Mia Moores, MD  11/21/2020 11:07 AM    [facility] [address], [place], [place]  [address] Phone: [phone]; Fax: [phone]

## 2020-11-21 ENCOUNTER — Encounter: Payer: Self-pay | Admitting: Cardiovascular Disease

## 2020-11-21 ENCOUNTER — Other Ambulatory Visit: Payer: Self-pay

## 2020-11-21 ENCOUNTER — Ambulatory Visit (INDEPENDENT_AMBULATORY_CARE_PROVIDER_SITE_OTHER): Payer: Medicare Other | Admitting: Cardiovascular Disease

## 2020-11-21 ENCOUNTER — Inpatient Hospital Stay: Admit: 2020-11-21 | Payer: MEDICARE | Primary: Internal Medicine

## 2020-11-21 VITALS — BP 134/72 | HR 64 | Ht 66.0 in | Wt 170.0 lb

## 2020-11-21 DIAGNOSIS — I10 Essential (primary) hypertension: Secondary | ICD-10-CM | POA: Diagnosis not present

## 2020-11-21 DIAGNOSIS — I471 Supraventricular tachycardia: Secondary | ICD-10-CM

## 2020-11-21 DIAGNOSIS — C50512 Malignant neoplasm of lower-outer quadrant of left female breast: Secondary | ICD-10-CM

## 2020-11-21 LAB — NOVEL CORONAVIRUS, NAA: SARS-CoV-2, NAA: NOT DETECTED

## 2020-11-21 LAB — SARS-COV-2, NAA 2 DAY TAT

## 2020-11-21 LAB — SPECIMEN STATUS REPORT

## 2020-11-21 NOTE — Progress Notes (Signed)
Negative for Covid

## 2020-11-21 NOTE — Patient Instructions (Signed)
Medication Instructions:  Your physician recommends that you continue on your current medications as directed. Please refer to the Current Medication list given to you today.  *If you need a refill on your cardiac medications before your next appointment, please call your pharmacy*   Lab Work: None Ordered If you have labs (blood work) drawn today and your tests are completely normal, you will receive your results only by: MyChart Message (if you have MyChart) OR A paper copy in the mail If you have any lab test that is abnormal or we need to change your treatment, we will call you to review the results.   Testing/Procedures: None Ordered    Follow-Up: At [facility], you and your health needs are our priority.  As part of our continuing mission to provide you with exceptional heart care, we have created designated Provider Care Teams.  These Care Teams include your primary Cardiologist (physician) and Advanced Practice Providers (APPs -  Physician Assistants and Nurse Practitioners) who all work together to provide you with the care you need, when you need it.   Your next appointment:   1 year(s)  The format for your next appointment:   In Person  Provider:   You may see Philip Nahser, MD or one of the following Advanced Practice Providers on your designated Care Team:   Scott Weaver, PA-C Vin Bhagat, PA-C   

## 2020-11-26 NOTE — Progress Notes (Signed)
Called patient to review survivorship care plan. Patient was mailed a copy. Patient has my contact information in case a need arises.

## 2020-11-27 NOTE — Telephone Encounter (Signed)
err

## 2020-11-28 DIAGNOSIS — M67912 Unspecified disorder of synovium and tendon, left shoulder: Secondary | ICD-10-CM | POA: Diagnosis not present

## 2020-12-09 ENCOUNTER — Other Ambulatory Visit: Payer: Self-pay

## 2020-12-09 ENCOUNTER — Encounter: Payer: Self-pay | Admitting: Psychology

## 2020-12-09 ENCOUNTER — Encounter: Payer: Medicare Other | Attending: Psychology | Admitting: Psychology

## 2020-12-09 DIAGNOSIS — F0280 Dementia in other diseases classified elsewhere without behavioral disturbance: Secondary | ICD-10-CM | POA: Insufficient documentation

## 2020-12-09 DIAGNOSIS — R413 Other amnesia: Secondary | ICD-10-CM | POA: Diagnosis not present

## 2020-12-09 DIAGNOSIS — Z79899 Other long term (current) drug therapy: Secondary | ICD-10-CM | POA: Diagnosis not present

## 2020-12-09 NOTE — Progress Notes (Signed)
   Neuropsychology Note [place] completed 240 minutes of neuropsychological testing with this provider Health and safety inspector). The patient did not appear overtly distressed by the testing session, per behavioral observation or via self-report. Rest breaks were offered.   Clinical Decision Making: In considering the patient's current level of functioning, level of presumed impairment, nature of symptoms, emotional and behavioral responses during the interview, level of literacy, and observed level of motivation/effort, a battery of tests was selected.  Changes were made as deemed necessary based on patient performance on testing, behavioral observations and additional pertinent factors such as those listed above.  Tests Administered   Wechsler Adult Intelligence Scale, 4th Edition (WAIS-IV)  Wechsler Memory Scale, 4th Edition (WMS-IV); Older Adult Battery   Results:  To be included once scoring is complete.  Feedback with Patient: Mia [name] will return on 12/17/20 for an interactive feedback session with this provider at which time her test performances, clinical impressions and treatment recommendations will be reviewed in detail. The patient understands she can contact our office should she require our assistance before this time.  Full report to follow.

## 2020-12-10 LAB — CBC WITH DIFFERENTIAL/PLATELET
Absolute Monocytes: 739 cells/uL (ref 200–950)
Basophils Absolute: 54 cells/uL (ref 0–200)
Basophils Relative: 0.7 %
Eosinophils Absolute: 131 cells/uL (ref 15–500)
Eosinophils Relative: 1.7 %
HCT: 37.2 % (ref 35.0–45.0)
Hemoglobin: 12 g/dL (ref 11.7–15.5)
Lymphs Abs: 2125 cells/uL (ref 850–3900)
MCH: 27 pg (ref 27.0–33.0)
MCHC: 32.3 g/dL (ref 32.0–36.0)
MCV: 83.8 fL (ref 80.0–100.0)
MPV: 10.2 fL (ref 7.5–12.5)
Monocytes Relative: 9.6 %
Neutro Abs: 4651 cells/uL (ref 1500–7800)
Neutrophils Relative %: 60.4 %
Platelets: 394 10*3/uL (ref 140–400)
RBC: 4.44 10*6/uL (ref 3.80–5.10)
RDW: 14.9 % (ref 11.0–15.0)
Total Lymphocyte: 27.6 %
WBC: 7.7 10*3/uL (ref 3.8–10.8)

## 2020-12-10 LAB — COMPLETE METABOLIC PANEL WITH GFR
AG Ratio: 1.8 (calc) (ref 1.0–2.5)
ALT: 21 U/L (ref 6–29)
AST: 23 U/L (ref 10–35)
Albumin: 4.2 g/dL (ref 3.6–5.1)
Alkaline phosphatase (APISO): 86 U/L (ref 37–153)
BUN: 12 mg/dL (ref 7–25)
CO2: 28 mmol/L (ref 20–32)
Calcium: 9.6 mg/dL (ref 8.6–10.4)
Chloride: 99 mmol/L (ref 98–110)
Creat: 0.94 mg/dL (ref 0.50–0.99)
GFR, Est African American: 72 mL/min/{1.73_m2} (ref >=60)
GFR, Est Non African American: 62 mL/min/{1.73_m2} (ref >=60)
Globulin: 2.3 g/dL (calc) (ref 1.9–3.7)
Glucose, Bld: 86 mg/dL (ref 65–99)
Potassium: 4.7 mmol/L (ref 3.5–5.3)
Sodium: 134 mmol/L — ABNORMAL LOW (ref 135–146)
Total Bilirubin: 0.3 mg/dL (ref 0.2–1.2)
Total Protein: 6.5 g/dL (ref 6.1–8.1)

## 2020-12-10 NOTE — Progress Notes (Signed)
CBC and CMP are unremarkable.  Sodium is mildly decreased but not significant.

## 2020-12-11 ENCOUNTER — Other Ambulatory Visit: Payer: Self-pay | Admitting: Physician Assistant

## 2020-12-12 ENCOUNTER — Encounter (HOSPITAL_BASED_OUTPATIENT_CLINIC_OR_DEPARTMENT_OTHER): Payer: Medicare Other | Admitting: Psychology

## 2020-12-12 ENCOUNTER — Other Ambulatory Visit: Payer: Self-pay

## 2020-12-12 DIAGNOSIS — F0280 Dementia in other diseases classified elsewhere without behavioral disturbance: Secondary | ICD-10-CM | POA: Diagnosis not present

## 2020-12-12 DIAGNOSIS — F02A0 Dementia in other diseases classified elsewhere, mild, without behavioral disturbance, psychotic disturbance, mood disturbance, and anxiety: Secondary | ICD-10-CM

## 2020-12-12 DIAGNOSIS — R413 Other amnesia: Secondary | ICD-10-CM | POA: Diagnosis not present

## 2020-12-13 DIAGNOSIS — M25562 Pain in left knee: Secondary | ICD-10-CM | POA: Diagnosis not present

## 2020-12-16 ENCOUNTER — Encounter: Payer: Self-pay | Admitting: Psychology

## 2020-12-16 NOTE — Progress Notes (Unsigned)
NEUROPSYCHOLOGICAL EVALUATION - CONFIDENTIAL  STOP! If you are not the patient, the patient's legal guardian/caregiver, or an individual for whom the patient has signed a release of information then do not proceed and contact the above mentioned provider. Thank you!   PATIENT:    Mia [name]. Servidio DATE OF BIRTH:   03/28/1951 PROCEDURE:  Neuropsychological evaluation  DATE OF INTERVIEW: 10/02/2020 DATE OF TESTING:   12/09/2020 DATE OF REPORT:    12/12/2020 REFERRAL SOURCE:  Sterry Givens, NP (Neurology) of [facility]  MEDICAL NECESSITY:  To evaluate cognitive and emotional functioning in light of suspected memory loss and cognitive dysfunction and to assess for dementia.   SOURCES OF INFORMATION: The following information was gathered from a clinical interview with patient and husband as well as from a review of available medical records. The patient expressed understanding of the purpose of the evaluation and consented to all procedures.  BACKGROUND INFORMATION:  REASON FOR REFERRAL: Niyanna Asch Rampey is a 70 y.o. female referred by Derosa Givens, NP (Neurology) to assess her current level of cognitive functioning and assist in differential diagnosis. The current evaluation consisted of a review of available medical records, an interview with the patient and husband, and the completion of a neuropsychological testing battery. Informed consent was obtained.  HISTORY OF PRESENTING PROBLEM: Mrs. Ledee is a 70 year old right-handed, [place], female, with a history of neuromuscular changes (e.g., essential tremor, impaired gait w/left sided drift, recent falls, dropping things more, weakness, etc.) and memory decline for the past 10 to 11 years.  She is currently treated with Aricept and Namenda for her memory complaints. She contracted Covid and required hospitalization circa 10/2019. She has reported problems with fatigue and decreased information processing speed since.She  has recurrent neck and lower back pain and currently being followed by anorthopedic surgeon. She does not use a cane.   According to medical records, the patient was seen by Shoults Givens, NP of [facility]. This provider's note in EMR on 08/28/2020 indicates the following: "In regards to her memory she feels that it has gotten worse since having Covid in January. 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 She is able to complete all ADLs independently. She has noticed some change in her balance. States that she recently had a fall and injured her elbow. Feels that her tremor has been well controlled with Mysoline. Primarily just affects the hands. Reports that fibromyalgia continues to be fairly controlled with Effexor. She states that she does have fibrofog at times".   MEDICAL HISTORY:  Birth and developmental history are benign. Any history of traumatic brain injury was denied. According to medical records, the patient's problems list includes, but may not be limited, to the following:   Past Medical History:  Diagnosis Date  . Anal fissure   . Anemia   . Arthritis   . Blood transfusion   . C. difficile colitis   . Chest pain    a. 01/2013 MV: EF 59%, no ischemia.  . Chronic Dyspnea    a. 01/2013 Echo: EF 60-65%, Gr 1 DD, PASP 71mmHg.// Echo 01/2020: EF 60-65, no RWMA, GR 1 DD, GLS -21.6%, normal RV SF, trivial MR   . COVID-19   . DDD (degenerative disc disease), cervical   . DDD (degenerative disc disease), lumbar   . Diabetes mellitus   . Fibromyalgia   . Gall stones   .  GERD (gastroesophageal reflux disease)    gastritis  . Hemorrhoids   . Hypertension   . Interstitial cystitis   . MCI (mild cognitive impairment) 11/25/2017  . Memory difficulty 12/14/2013  . Neuromuscular disorder ([place])    sclerosis  . Osteoporosis   . Peptic ulcer   . PONV (postoperative nausea and  vomiting)   . Pseudogout   . Raynaud phenomenon   . Rectal bleeding   . Sleep apnea    a. on cpap.  Marland Kitchen SVT (supraventricular tachycardia) ([place])   . Syncope 09/10/2015  . Thyroid disease    hypothyroidism  . Tremor, essential 05/14/2016   IMAGING:   MRI of the Brain with and without contrast on 09/01/2020 showed the following: 1.   Minimal generalized cortical atrophy, typical for age but slightly progressed compared to the 2016 MRI 2.   Few punctate T2/FLAIR hyperintense foci the subcortical white matter.  This is a nonspecific finding but most likely represents minimal chronic microvascular ischemic change, typical for age. 3.   There is a normal enhancement pattern and no acute findings.   CURRENT MEDICATIONS:  Outpatient Encounter Medications as of 12/12/2020  Medication Sig  . ipratropium (ATROVENT) 0.03 % nasal spray Place 2 sprays into both nostrils every 12 (twelve) hours.  . Cyanocobalamin (VITAMIN B-12 IJ) Inject 1 mL as directed every 30 (thirty) days.   Marland Kitchen denosumab (PROLIA) 60 MG/ML SOLN injection Inject 60 mg into the skin every 6 (six) months. Administer in upper arm, thigh, or abdomen  . donepezil (ARICEPT) 10 MG tablet TAKE 1 TABLET AT BEDTIME  . fluticasone (FLONASE) 50 MCG/ACT nasal spray Place 2 sprays into both nostrils daily.  Marland Kitchen gabapentin (NEURONTIN) 300 MG capsule TAKE 1 TO 2 CAPSULES BY MOUTH UP TO TWO TIMES DAILY AS NEEDED  . leflunomide (ARAVA) 20 MG tablet TAKE ONE TABLET BY MOUTH DAILY  . losartan (COZAAR) 50 MG tablet Take 2 tablets (100 mg total) by mouth daily. Take 1 tablet in the morning and 1 tablet in evening as needed for blood pressure greater than 150  . Melatonin 10 MG CAPS Take 10 capsules by mouth daily.  . meloxicam (MOBIC) 15 MG tablet Take by mouth.  . memantine (NAMENDA) 10 MG tablet TAKE 1 TABLET TWICE DAILY  . metFORMIN (GLUCOPHAGE-XR) 500 MG 24 hr tablet TAKE 2 TABLETS EVERY DAY WITH BREAKFAST  . metoprolol tartrate (LOPRESSOR) 100 MG  tablet TAKE 1 AND 1/2 TABLETS TWICE DAILY  . ondansetron (ZOFRAN-ODT) 8 MG disintegrating tablet Take 1 tablet by mouth as needed for nausea.  . pantoprazole (PROTONIX) 40 MG tablet Take 40 mg by mouth 2 (two) times daily.  . polyethylene glycol powder (GLYCOLAX/MIRALAX) 17 GM/SCOOP powder Take 17 g by mouth daily as needed for mild constipation or moderate constipation.   . primidone (MYSOLINE) 50 MG tablet TAKE 1 TABLET AT BEDTIME  . venlafaxine XR (EFFEXOR-XR) 75 MG 24 hr capsule TAKE 1 CAPSULE EVERY DAY WITH BREAKFAST   No facility-administered encounter medications on file as of 12/12/2020.   PSYCHIATRIC HISTORY:  Patient denied ever being diagnosed with or treated for a mental health disorder, and the psychiatric history is purportedly benign with no prolonged periods of depression or anxiety ever experienced. Current mood was described as " ____"  There is no reported history of alcohol or illicit substance abuse/dependence. No suicidal/homicidal ideation, plan or intent endorsed. No manic or hypomanic episodes were reported. The patient denied ever experiencing any auditory/visual hallucinations. No behavioral or personality changes  were endorsed.  FAMILY HISTORY:  They were unaware of any family history of neurological or movement disorders, neurodegenerative conditions, substance abuse, or psychiatric issues.  PSYCHOSOCIAL HISTORY:  There are no indications of learning or intellectual disability, or ADHD.    CURRENT EXAMINATION:  BEHAVIORAL OBSERVATIONS:  Patient was appropriately dressed for season and situation and appeared tidy and well-groomed. Stature and height were unremarkable. Patient appeared well-nourished and chronological age. Sensory and motor abilities appeared abnormal.  Patient was friendly and rapport was established. Speech was as expected. The patient was able to understand test directions. Mood was euthymic and affect was mood congruent. Attention and motivation  were good. Insight was intact.  Optimal test taking conditions were maintained.  Mental Status: The patient was alert and fully oriented to person, place, time, and situation.  Attention and concentration were as expected.  Fund of information was typical.  Thinking was goal-directed and appeared normal from the perspective of productivity, relevance, and coherence with no preoccupations. The patient was able to form concepts well.  Judgment and decision-making appeared intact.  Insight was full.  Oriented to all spheres. Accurately named the current President and his predecessor.   TESTS ADMINISTERED:  . [place] (BNT) . Complex Ideational Material (CIM)  . Controlled Oral Word Association Test (COWAT; FAS & Animals)  . Finger Tapping Test (FTT) . Generalized Anxiety Scale, 7-Item (GAD-7) . Geriatric Depression Scale, (GDS-15); Short Form  . Geriatric Anxiety Scale (GAI) . Grooved Pegboard . Hand Dynamometer  . RBANS-C Line Orientation  . Rey-O Complex Figure Test; Copy trial . Trail Making Test (TMT)  . Wechsler Adult Intelligence Scale, 4th Edition (WAIS-IV) . Wechsler Memory Scale, 4th Edition (WMS-IV); Older Adult Battery  Test Results: Note: Standardized scores are presented only for use by appropriately trained professionals and to allow for any future test-retest comparison. These scores should not be interpreted without consideration of all the information that is contained in the rest of the report. The most recent standardization samples from the test publisher or other sources were used whenever possible to derive standard scores; scores were corrected for age, gender, ethnicity and education when available.   TEST SCORES:  Note: This summary of test scores accompanies the interpretive report and should not be considered in isolation without reference to the appropriate sections in the text. Descriptors are based on appropriate normative data and may be adjusted based on  clinical judgment. The terms "impaired" and "within normal limits (WNL)" are used when a more specific level of functioning cannot be determined.    Validity Testing:     Descriptor         WAIS-IV Reliable Digit Span: --- --- Within Expectation         Intellectual Functioning:                 Standard Score Percentile    Wechsler Adult Intelligence Scale (WAIS-IV):  Standard Score/ Scaled Score Percentile    Full Scale IQ  92 30 Average  GAI 92 30 Average  Verbal Comprehension Index: 98 45 Average  Similarities  9 37 Average  Vocabulary 11 63 Average  Information  9 37 Average  Perceptual Reasoning Index:  86 18 Below Average  Block Design  8 25 Average  Matrix Reasoning  6 9 Below Average  Visual Puzzles 9 37 Average  Working Memory Index: 102 55 Average  Digit Span 8 25 Average  Arithmetic  13 84 Above Average  Processing Speed Index: 89  23 Below Average  Symbol Search  8 25 Average  Coding 8 25 Average         Memory:               Wechsler Memory Scale (WMS-IV): Older Adult     Standard Score Percentile   Auditory Memory Index (AMI) 110 75 Average  Delayed Memory Index (DMI) 104 61 Average  Immediate Memory Index (IMI) 102 55 Average  Visual Memory Index (VMI) 89 23 Below Average        Raw Score (Scaled Score) Percentile    Logical Memory I 37/53 (12) 75 Above Average  Logical Memory II 26/39 (13) 84 Above Average  Logical Memory Recognition 20/23 51-75 Average         Verbal Paired Associates I 25/40 (11) 63 Average   Verbal Paired Associates II 7/10 (11) 63  Average  Verbal Paired Associates Recognition 28/30 26-50 Average       Visual Reproduction I 28/43 (8) 25 Average  Visual Reproduction II 14/43 (8) 25 Average  Visual Reproduction Recognition 4/7 26-50 Average         Attention/Executive Function:               Trail Making Test (TMT): Raw Score (T Score) Percentile    Part A 43 secs., 0 errors (41) 18 Below Average  Part B 109 secs., 1 errors (38)  12 Below Average           Scaled Score Percentile    WAIS-IV Coding: 8 25 Average           Scaled Score Percentile    WAIS-IV Digit Span: 8 25 Average  Forward 8 25 Average  Backward 10 50 Average  Sequencing 8 25 Average           Age-Scaled Score Percentile    Wechsler Memory Scale (WMS-IV) Symbol Span: 9 37 Average           Scaled Score Percentile    WAIS-IV Similarities: 9 37 Average         Language:               [name] Diagnostic Aphasia Examination (BDAE) Raw Score Percentile    Complex Ideational Material: 12/12 --- Average         Verbal Fluency Test: Raw Score (T Score) Percentile    Phonemic Fluency (FAS) 29 (39) 14 Below Average  Animal Fluency 19 (48) 42 Average           Raw Score (T Score) Percentile    [name] Naming Test (BNT): 57/60 (52) 58 Average         Visuospatial/Visuoconstruction:          Raw Score Percentile    Clock Drawing: 10/10 --- Within Normal Limits  RCFT, Copy: 29/36 6-10 Well Below Average  RBANS Line Orientation, Form C: 16/20 26-50 Average           Scaled Score Percentile    WAIS-IV Block Design: 8 25 Average  WAIS-IV Matrix Reasoning: 6 9 Below Average  WAIS-IV Visual Puzzles: 9 37 Average         Sensory-Motor:               [place] Grooved Pegboard Test: Raw Score Percentile    Dominant Hand 135 secs., 1 drops  1 Exceptionally Low  Non-Dominant Hand 140 secs., 0 drops  2 Exceptionally Low         Finger Tapping Test: Mean  Percentile    Dominant Hand 44 Taps 46 Average  Non-Dominant Hand 25 Taps 2 Exceptionally Low         Hand Dynamometer  Mean Percentile   Dominant Hand 10 Kg. - Below Average  Non-Dominant Hand 4 Kg. - Well Below Average       Mood and Personality:          Raw Score Percentile    Geriatric Depression Scale, Short Form: 1 --- Within Normal Limits  Geriatric Anxiety Inventory: 1 --- Within Normal Limits  GAD-7: 2 --- Within Normal Limits    VALIDITY OF EVALUATION: Scores on objective and  embedded measures of performance validity were within normal limits, and there were no behavioral manifestations that suggested suboptimal effort or poor test engagement. As such, the following test results are considered valid and interpretable.  SUMMARY OF TEST RESULTS:  Premorbid verbal intellectual abilities were estimated to have been within the average range based on a test of vocabulary ability. Current intellectual functioning was found to be average for her age. Verbal Comprehension Index Score of 98 (45th percentile) is average compared to age matched peers. Perceptual Reasoning Index Score of 86 (18th percentile) is below average and slightly weaker (but not statistically significant) than her verbal abilities. Her ability to arrange blocks to match a visual design was average. Psychomotor processing speed was also below average (23rd percentile). Auditory attention and working memory were average (55th percentile).   Other language abilities including confrontation naming and semantic verbal fluency were average. Auditory comprehension of complex ideational material was average. With regard to verbal memory, encoding and acquisition of a list of word pairs was average. After a 20 minute delay, free recall was average. Performance on a yes/no recognition task was average. On another verbal memory test, encoding and acquisition of contextual auditory information (i.e., short stories) was above average. After a delay, free recall was also above average. Performance on a yes/no recognition task was average. With regard to non-verbal memory, delayed free recall of visual information was average. Executive functioning was mixed overall. Mental flexibility and set-shifting were below average on Trails B. Verbal fluency with phonemic search restrictions was below average. Verbal abstract reasoning was average. Non-verbal abstract reasoning was below average. Performance on a clock drawing task was average.  Her ability to copy of complex visual spatial figure was  well below average. Spatial judgement was average. Motor coordination in non-dominant hand appears significantly weaker than dominant side.  On a self-report measure of mood, the patient's responses were not indicative of clinically significant depression at the present time. On self-report measures of anxiety, the patient did not endorse any clinically significant generalized anxiety at the present time.   CLINICAL IMPRESSIONS:   While normal neuropsychological performance does not necessarily rule out the possibility of some mild and/or transient cognitive problems, it does provide strong evidence against any very significant impairment.  REFERRING DIAGNOSIS:  Memory changes  FINAL DIAGNOSES (ICD-10 considerations):    RECOMMENDATIONS: 1. Follow-up with Mays Givens, NP (Neurology) and Dr. Madilyn Fireman.  . No treatment for cognition warranted.  . Consider treatment with cholinesterase inhibitor.  . Consider supplementation with memantine, an NMDA antagonist, after the cholinesterase inhibitor is recorded as well tolerated.  . Given report of visual hallucinations and delusions, could consider an off label trial of Nuplazid. . Encourage limiting or eliminating the use of XXX and other cognitively impairing medications or medications with strong anticholinergic side effects.  - Patient is encouraged to discuss  the current medication regimen with prescribing providers. There are several medications that are prescribed that can cause problems with memory and thinking. Of course, there is a cost-benefit analysis that must be considered carefully and it may not be possible to treat all of the patient's symptoms without some cognitive side effects. . Sinemet and possibly zonisamide for DLB (if needed). Dose donepezil in the morning. There is mixed evidence for cognitive and neuropsychiatric symptom improvement with memantine but is a better  option than antipsychotics; in this domain, 12.5 mg of Seroquel is the recommended starting dose, though this can worsen parkinsonism and cognition.  . While the U.S. Food and Drug Administration (FDA) has not approved any drugs specifically to treat symptoms of vascular induced cognitive deficits, there is evidence from clinical trials that drugs approved to treat Alzheimer's symptoms (i.e., cholinesterase inhibitors and memantine) may offer modest benefit. Whether this is initiated is best left determined by the patient's care providers.   . If agitation worsens, or begins to significantly affect daily life, then consider Nuedexta.  . Consider obtaining neuroimaging (i.e., brain MRI scan or head CT scan if an MRI is contraindicated).  . Could consider spinal tap to assess for amyloid beta and tau proteins in cerebrospinal fluid, though this procedure is invasive and adds marginal additive value over clinical diagnosis.  . Could consider spinal to assess for oligoclonal bands, elevated levels of IgG antibodies, or other proteins that are the breakdown products of myelin knowing that an abnormal immune response in CSF is found in other disease, and some 5-10% of patients with MS never exhibit these abnormalities.  . Consider ordering labs to assess for other possible etiologies of cognitive disruption (e.g., TSH, vitamin B-12, methylmalonic acid, homocysteine, RPR, folate, vitamin D).  . Considering the noted medical history, it is recommended that the patient strictly comply with prescribed medical treatments for cerebrovascular risk factors (e.g., high cholesterol, high blood pressure, sleep apnea, diabetes). . Considering the noted medical history, the patient is at increased risk for progression of cognitive impairment. Therefore, it is recommended that the patient aggressively manage any modifiable risk factors for further cognitive decline such as strict compliance with prescribed medical treatments  for any cerebrovascular risk factors (e.g., high cholesterol, high blood pressure, sleep apnea, diabetes). . Non-pharmacological treatments for Parkinson's disease:  - Exercise and stretching - Voice therapy - Speech/swallowing therapy - Balance-related exercises - Occupational therapy  2. This individual appears to be experiencing clinical symptoms of depression and anxiety.  As such, engaging in psychotherapy should be considered to better manage mood symptomology. Therein the patient can work on identifying triggers for depression and anxiety as well as work towards developing healthy coping mechanisms and implementing lifestyle modifications that can help to support ongoing mental health.    3. If XXX is able to obtain improved relief from depression and anxiety, the patient may consider a return to work in the future.  It is suggested that the return to work might initially be on a part-time basis.  Moreover, it is suggested that work responsibilities might be modified, such that the patient would have less of a managerial and supervisory role, with greater emphasis on routine clerical type tasks, with attention to the compensatory strategies noted.   4. The Memory Counseling Program at the [facility] in [place] provides counseling services for individuals diagnosed with mild cognitive impairment, Alzheimer's disease, or another form of dementia, as well as to their family members. Services include individual, couple, and family  counseling, as well as support groups, all of which provide a safe environment to talk about the journey with mild cognitive impairment or dementia, learn as much as possible about the disease, problem solve some of the common challenges encountered with memory and cognitive loss, and strengthen relationships. For more information, they can be contacted at [phone].   5. Due to the nature and severity of the symptoms noted during this evaluation, it is  recommended that the patient remain under 24-hour care and supervision, as the cognitive deficits noted represent a safety risk if left alone for extended periods of time.  As long as a spouse or family member that can stay with the patient remains healthy, no changes are needed in living situation.   6. Given the family reports of episodes of poor judgment resulting in possible safety issues, there may be benefit from a home safety evaluation with someone such as an occupational therapist.  7. Continued assistance with certain activities of daily living (e.g., medication and financial management) is recommended.    8. It is recommended that the severity of the patient's impairments is considered when assessing the level of asset management required. For example, given impairment higher-level reasoning and problem-solving skills, it is recommended that the patient consult with a family member or another trusted advisor prior to making any important medical, legal, or financial decisions. Establishing or continuing to rely upon someone who has Power of Musician and medical decision-making is recommended.    9. The patient should limit or refrain from driving, as deficits noted on testing could affect one's ability to safely operate a motor vehicle.  At minimum, it is recommended that this person undergo a formal driving evaluation. Could Wellsite geologist at: [phone].    10. It may be beneficial to contact the [employer] [facility] in [place] to find alternative methods of transportation and identify other services that may be beneficial for the patient now or in the future. They can be reached at [phone].  11. Regular medical care is important for an individual with dementia. Therefore, make sure to maintain regular appointments with all medical providers. In addition, schedule these appointments during the patient's best time  of day.  12. Reading the 36-Hour Day by Freddi Che and Rabins may be helpful in providing educational support for family members.  In addition, attending a dementia support group may help everyone share some of their feelings with others who are encountering similar problems.  13. [employer] is a link is to The Caregiver's Handbook: A comprehensive guide with tools, resources, and in-depth solutions to some of caregiving's toughest challenges.   14. The patient should wear identification at all times, in case the individual becomes lost and disoriented.   15. The cognitive and/or psychiatric symptoms noted put the patient at a significant vocational disadvantage. As such, it may be beneficial to contact a vocational rehabilitation service. They can assist with finding employment opportunities that are within a person's capabilities, or teach ways to perform work-related activities more effectively. This person may also require more intense retraining of procedures he or she has already learned.   16. The patient is encouraged to attend to lifestyle factors for brain health (e.g., regular physical exercise, good nutrition habits, regular participation in cognitively-stimulating activities, and general stress management techniques), which are likely to have benefits for both emotional adjustment and cognition.  In fact, in addition to promoting general good health, regular exercise incorporating aerobic  activities (e.g., brisk walking, jogging, bicycling, etc.) has been demonstrated to be a very effective treatment for depression and stress, with similar efficacy rates to both antidepressant medication and psychotherapy. And for those with orthopedic issues, water aerobics may be particularly beneficial.  17. Included with the report is a flyer that includes activities that have therapeutic value and might be useful to keep you cognitively stimulated; these lists  were compiled by therapists at the [facility], and are categorized by level of difficulty; this is not a substitute for therapy. This information can also be found at the following:  https://www.[facility].org/get-to-know-barrow/centers-programs/neurorehabilitation-center/neuro-rehab-apps-and-games/  18. Nutritional factors can have a significant effect on psychological and emotional status, as well as overall brain functioning.  The following general recommendations have been associated with improvements in depression and other psychological symptoms, as well as lower risk for dementia and other forms of cognitive impairment.  Please discuss these recommendations with your physician and/or dietitian before initiating:  . Consume a wide variety of fresh fruits and vegetables, particularly including brightly colored items such as berries, oranges, tomatoes, peppers, carrots, broccoli, spinach, dark green lettuces, sweet potatoes, etc., all of which are high in vitamins and antioxidants.  . Consume foods that are high in fiber, such as legumes (e.g., beans, peas, lentils) and foods made from whole grains (e.g., whole wheat bread and pasta)  . Consume a significant amount of omega-3 essential fats and oils.  These can be found in natural food sources such as salmon and other fatty fish, and also products made from flax seed and flax seed oil.  Alternately, dietary supplementation with fish oil capsules and flax seed oil capsules is a good way to boost one's level of omega-3 consumption.  It is important to check with your doctor before taking these supplements, especially if you take blood thinning medication.  . If you do not already do so, consider taking a quality multivitamin supplement under the guidance of your primary care physician.   . Consider keeping consumption of the following foods to a minimum: 1) foods made from white flour and white sugar; 2) artificial sweeteners; 3)  deep-fried foods; 4) animal fat other than fish; 5) any foods containing "hydrogenated" or "trans" fats; 6) most other types of highly processed packaged/prepared foods.   19. Neuropsychological re-evaluation is recommended in approximately one year (or sooner) to monitor treatment efficacy, to assist with the management of the patient (start or continue rehab or pharmacological therapy), to determine any clinical and functional significance of brain abnormality over time, as well as to document any potential improvement or decline in cognitive functioning. Lastly, any follow-up testing will help delineate the specific cognitive basis of any new functional complaints. If you wish to make this follow-up appointment, please contact our office at [phone].  20. Neuropsychological re-evaluation is recommended as needed to monitor treatment efficacy, to assist with the management of the patient (start or continue rehab or pharmacological therapy), to determine any clinical and functional significance of brain abnormality over time, as well as to document any potential improvement or decline in cognitive functioning. Lastly, any follow-up testing will help delineate the specific cognitive basis of any new functional complaints. If you wish to make a follow-up appointment, please contact our office at [phone].  21. Try to keep in mind that common word finding errors are not necessarily the start of a dreadful decline. Over-focusing on these errors can contribute to further distraction and emotions that can detract from effective retrieval of words;  this in turn, can lead to greater distress and more difficulties with recalling the specific word you were looking for to begin with.   The following are several strategies that may help:  . Performance will generally be best in a structured, routine, and familiar environment, as opposed to situations involving complex problems.  Marlene Lard a place to keep  your keys, wallet, cell phone, and other personal belongings. . Take time to register and process information to be remembered. Deeper encoding of information can be gained by forming a mental picture, making meaningful associations, connecting new information to previously learned and related information, paraphrasing and repetition.  . To the extent possible, multitasking should be avoided; break down tasks into smaller steps to help get started and to keep from feeling overwhelmed. And if there are difficulties in organization and planning, maintaining a daily organizer to help keep track of important appointments and information may be beneficial.   . Memory problems may at least be minimally addressed using compensatory strategies such as the use of a daily schedule to follow, memos, portable recorder, a centrally located bulletin board, or memory notebook. A large calendar, placed in a highly visible location would be valuable to keep track of dates and appointments.  In addition, it would be helpful to keep a log of all of medical appointments with the name of the doctor, date of visit, diagnoses, and treatments.  . Use of a medication box is recommended to ensure compliance and decrease confusion regarding medication dosages, times, and dates. . To aid in managing problems with attention, the patient may consider using some of the following strategies: o The patient should simplify tasks.  There may be a need to break overly complex activities into simple step-by-step tasks, keep these steps written down in a note book and then check them off as they are completed which will help to stay on task and make sure the whole task is finished.  o The patient should set deadlines for everything, even for seemingly small tasks, prioritize time-sensitive tasks and write down every assignment, message, or important thought. o The patient is encouraged to use timers and alarms to stay on track and take breaks at  regular intervals. Avoid piles of paperwork or procrastination by dealing with each item as it comes in.  Community resources are available regarding the care of adults with dementia. The Alzheimer's Association has a website that offers tips and recommendations to aide families in caring for family members with dementia (even for those without Alzheimer's disease). VerifiedMovies.de.  In addition, a document that contains valuable resources for dementia care is provided.   Community resources are available for those diagnosed with multiple sclerosis. The National Multiple Sclerosis Society has a website that offers numerous tips and recommendations to aide affected persons. There website is as follows: http://www.davis.biz/.   Community resources are available for those diagnosed with Parkinson's disease.  There website is as follows: OrderTeeth.com.cy.    Resources are available for those diagnosed with Lewy Body Dementia disease.  There website is as follows: SocialListing.com.br  Community resources are available for those diagnosed with atypical parkinsonism, including progressive supranuclear palsy and corticobasal degeneration. There is a support group for PSP/CBD/MSA patients and caregivers in [place] at the [facility]. Contact [phone] for more information. In addition, [employer] is an organization that provides information, resources, and online support groups for individuals diagnosed with PSP and other related disorders, and can be accessed at BelizeBus.at.  Techniques  for making living spaces safer:  . Make the stovetop and oven inaccessible by removing or covering the knobs . Secure all medications in a safe place . Unplug appliances when not in use . Reduce tripping hazards such as moving any cords out of the way . Could consider marking the edges of steps with colored tape . Install night lights throughout the  house  Preventing fraudulent activity: individuals with Alzheimer's disease and other dementias may be at increased risk of being taken advantage of. Consider the following:  . Limit access to credit cards or cash . Could consider placing a sign on the front door such as "No Solicitations" . Could contact the [facility] "Do Not Call" list and add the person's number. They can be reached at [phone]  Techniques of communicating with a person who has dementia/memory impairment:  . Maintain a regular schedule for as many activities as possible. . Practice reality orientation. . Repeat information quietly and firmly. Marland Kitchen Keep the voice low and calm and be rational and understanding. . Talk in a warm and encouraging manner. . Talk gently and calmly; smile and be relaxed. . Use normal voice tone, do not be condescending. . Talk in a quiet place without distraction. . Show respect and acceptance. . Use one sentence for each idea and check to see if the patient understands before proceeding. . Do not interrupt, signal acceptance of message by nods, or smiles when appropriate, and repeat what the patient has said when the message is complete. Marland Kitchen Keep stress, arguing, disagreements, and verbal tirades to a minimum, as any additional stressor on the patient will cause continued and more rapid deterioration.  In general, the best way to manage the behavioral and psychological symptoms of dementia such as agitation involves behavioral strategies such as using the three R's.  o Redirection (help distract your loved one by focusing their attention on something else, moving them to a new environment, or otherwise engaging them in something other than what is distressing to them)  o Reassurance (reassure them that you are there to take care of them and that there is nothing they need to be worried about), and  o Reconsidering (consider the situation from their perspective and try to  identify if there is something about the situation or environment that may be triggering their reaction).  Hallucinations are false perceptions of objects or events involving the senses. These false perceptions can be caused by changes within the brain that result from Alzheimer's, usually in the later stages of the disease, although they can also occur as a result of vision loss. The following strategies may be helpful in responding to the patient's hallucinations: o Offer reassurance - Respond in a calm, supportive manner. You may want to respond with, "Don't worry. I'm here. I'll protect you. I'll take care of you." - Gentle patting may turn the person's attention toward you and reduce the hallucination. - Acknowledge the feelings behind the hallucination and try to find out what the hallucination means to the individual. You might want to say, "It sounds as if you're worried" or "I know this is frightening for you." o Use distractions - Suggest a walk or move to another room. Frightening hallucinations often subside in well-lit areas where other people are present. - Try to turn the person's attention to music, conversation or activities you enjoy together. o Respond honestly - If the person asks you about a hallucination or delusion, be honest. For example, if he  or she asks, "Do you see him?" you may want to answer with, "I know you see something, but I don't see it." This way, you're not denying what the person sees or hears, but you avoid an argument. o Modify the environment - Check for sounds that might be misinterpreted, such as noise from a television or an air conditioner. - Look for lighting that casts shadows, reflections or distortions on the surfaces of floors, walls and furniture. Turn on lights to reduce shadows. - Cover mirrors with a cloth or remove them if the person thinks that he or she is looking at a stranger.  Delusions (firmly held beliefs in things that are not real) may  occur in middle-to-late-stage dementia. Confusion and memory loss - such as the inability to remember certain people or objects - can contribute to these untrue beliefs. A person with dementia may believe a family member is stealing his or her possessions or that he or she is being followed by the police. This kind of suspicious delusion is sometimes referred to as paranoia. Although not grounded in reality, the situation is very real to the person with dementia. Keep in mind that a person with dementia is trying to make sense of his or her world with declining cognitive function. A delusion is not the same thing as a hallucination. While delusions involve false beliefs, hallucinations are false perceptions of objects or events that are sensory in nature. The following strategies may be helpful in responding to delusions: o Don't take offense. Listen to what is troubling the person, and try to understand that reality. Then be reassuring, and let the person know you care. o Don't argue or try to convince. Allow the individual to express ideas. Acknowledge his or her opinions. o Offer a simple answer. Share your thoughts with the individual, but keep it simple. Don't overwhelm the person with lengthy explanations or reasons. o Switch the focus to another activity. Engage the individual in an activity, or ask for help with a chore. o Duplicate any lost items. If the person is often searching for a specific item, have several available (if possible). For example, if the individual is always looking for his or her wallet, purchase two of the same kind.  FEEDBACK TO PATIENT: Yudith Norlander Houser will return for a feedback appointment on 12/17/20 to review the results of her neuropsychological evaluation with this provider.  Thank you for your referral of [facility]. If you have any questions, please contact us at [phone].   This report is intended solely for the confidential review and use by the referring  professional to assist in diagnostic and medical decision making needs.  This report should not be released to a third party without proper consent. [NOTE: data can be made available to qualified professionals with permission from the patient or legal representative/caregiver]    ____________________________________ Alfonso Ellis, PsyD,  Licensed Psychologist (Provisional) Clinical Neuropsychologist         Billing/Service Summary:   Neurobehavioral Status Exam:  Base: [phone] Add-on: [id number]  Direct clinical assessment (interview) of the patient and collateral interviews (as appropriate) by the licensed psychologist  Total time: 120 minutes       Total units:  1 1  Neuropsychological Testing Evaluation Services:   Base: [id number] Add-on: [phone]  Records review & clarify referral question; Patient symptom management; clinical decision making/battery modification; Integration/report generation; and, post-service work   Total time:  120 minutes  Total units:  1 1  Designer, fashion/clothing by Psychologist:  Base: [id number] Add-on: [phone]  Test Administration (face-to-face) Scoring (Non-face-to-face)                                                                                       Total time: 240 minutes      Total units:  1 7

## 2020-12-16 NOTE — Progress Notes (Incomplete)
NEUROPSYCHOLOGICAL EVALUATION - CONFIDENTIAL  STOP! If you are not the patient, the patient's legal guardian/caregiver, or an individual for whom the patient has signed a release of information then do not proceed and contact the above mentioned provider. Thank you!   PATIENT:    Mia [name]. [place] DATE OF BIRTH:   November 12, 1950 PROCEDURE:  Neuropsychological evaluation  DATE OF INTERVIEW: 10/02/2020 DATE OF TESTING:   12/09/2020 DATE OF REPORT:    12/12/2020 REFERRAL SOURCE:  [name], NP (Neurology) of [facility]  MEDICAL NECESSITY:  To evaluate cognitive and emotional functioning in light of suspected memory loss and cognitive dysfunction and to assess for dementia.   SOURCES OF INFORMATION: The following information was gathered from a clinical interview with patient and husband as well as from a review of available medical records. The patient expressed understanding of the purpose of the evaluation and consented to all procedures.  BACKGROUND INFORMATION:  REASON FOR REFERRAL: Mia [name] is a 70 y.o. female referred by [name], NP (Neurology) to assess her current level of cognitive functioning and assist in differential diagnosis. The current evaluation consisted of a review of available medical records, an interview with the patient and husband, and the completion of a neuropsychological testing battery. Informed consent was obtained.  HISTORY OF PRESENTING PROBLEM: Mia [name] is a 70 year old right-handed, [place], female, with a history of neuromuscular changes (e.g., essential tremor, impaired gait w/left sided drift, recent falls, dropping things more, weakness, etc.) and memory decline for the past 10 to 11 years.  She is currently treated with Aricept and Namenda for her memory complaints. She contracted Covid and required hospitalization circa 10/2019. She has reported problems with fatigue and decreased information processing speed since.She  has recurrent neck and lower back pain and currently being followed by anorthopedic surgeon. She does not use a cane.   According to medical records, the patient has been followed by [name], MD of [facility] on since 2014. This provider's note in EMR on that date noted the following on neurological exam during that visit:   Mental status: The patient is alert and oriented x 3 at the time of the examination. The Mini-Mental status examination done today shows a total score of 27/30. Cranial nerves: Facial symmetry is present. Speech is normal, no aphasia or dysarthria is noted. Extraocular movements are full. Visual fields are full. Motor: The patient has good strength in all 4 extremities. Sensory examination: Soft touch sensation is symmetric on the face, arms, and legs. Coordination: The patient has good finger-nose-finger and heel-to-shin bilaterally. Gait and station: The patient has a normal gait. Tandem gait is unsteady.  Romberg is positive, the patient tends to fall over. Reflexes: Deep tendon reflexes are symmetric, with exception there is a slight decrease in the left ankle jerk reflex as compared to the right.  Assessment/Plan: 1.  Fibromyalgia 2.  Mild essential tremor 3.  Memory disturbance, stable 4.  Recent Covid virus infection 5.  Chronic gait disorder  The patient was seen by [name], NP of [facility]. This provider's note in EMR on 08/28/2020 indicates the following: "In regards to her memory she feels that it has gotten worse since having Covid in January. 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 She is able to complete all ADLs independently. She has noticed some change in her balance. States that she recently had  a fall and injured her elbow. Feels that her tremor has been well controlled with Mysoline. Primarily just affects the  hands. Reports that fibromyalgia continues to be fairly controlled with Effexor. She states that she does have fibrofog at times".   MEDICAL HISTORY:  Birth and developmental history are benign. Any history of traumatic brain injury was denied. According to medical records, the patient's problems list includes, but may not be limited, to the following:   Past Medical History:  Diagnosis Date  . Anal fissure   . Anemia   . Arthritis   . Blood transfusion   . C. difficile colitis   . Chest pain    a. 01/2013 MV: EF 59%, no ischemia.  . Chronic Dyspnea    a. 01/2013 Echo: EF 60-65%, Gr 1 DD, PASP 8mmHg.// Echo 01/2020: EF 60-65, no RWMA, GR 1 DD, GLS -21.6%, normal RV SF, trivial MR   . COVID-19   . DDD (degenerative disc disease), cervical   . DDD (degenerative disc disease), lumbar   . Diabetes mellitus   . Fibromyalgia   . Gall stones   . GERD (gastroesophageal reflux disease)    gastritis  . Hemorrhoids   . Hypertension   . Interstitial cystitis   . MCI (mild cognitive impairment) 11/25/2017  . Memory difficulty 12/14/2013  . Neuromuscular disorder ([place])    sclerosis  . Osteoporosis   . Peptic ulcer   . PONV (postoperative nausea and vomiting)   . Pseudogout   . Raynaud phenomenon   . Rectal bleeding   . Sleep apnea    a. on cpap.  Marland Kitchen SVT (supraventricular tachycardia) ([place])   . Syncope 09/10/2015  . Thyroid disease    hypothyroidism  . Tremor, essential 05/14/2016   IMAGING:   MRI of the Brain with and without contrast on 09/01/2020 showed the following: 1.   Minimal generalized cortical atrophy, typical for age but slightly progressed compared to the 2016 MRI 2.   Few punctate T2/FLAIR hyperintense foci the subcortical white matter.  This is a nonspecific finding but most likely represents minimal chronic microvascular ischemic change, typical for age. 3.   There is a normal enhancement pattern and no acute findings.   CURRENT MEDICATIONS:  Outpatient Encounter  Medications as of 12/12/2020  Medication Sig  . ipratropium (ATROVENT) 0.03 % nasal spray Place 2 sprays into both nostrils every 12 (twelve) hours.  . Cyanocobalamin (VITAMIN B-12 IJ) Inject 1 mL as directed every 30 (thirty) days.   Marland Kitchen denosumab (PROLIA) 60 MG/ML SOLN injection Inject 60 mg into the skin every 6 (six) months. Administer in upper arm, thigh, or abdomen  . donepezil (ARICEPT) 10 MG tablet TAKE 1 TABLET AT BEDTIME  . fluticasone (FLONASE) 50 MCG/ACT nasal spray Place 2 sprays into both nostrils daily.  Marland Kitchen gabapentin (NEURONTIN) 300 MG capsule TAKE 1 TO 2 CAPSULES BY MOUTH UP TO TWO TIMES DAILY AS NEEDED  . leflunomide (ARAVA) 20 MG tablet TAKE ONE TABLET BY MOUTH DAILY  . losartan (COZAAR) 50 MG tablet Take 2 tablets (100 mg total) by mouth daily. Take 1 tablet in the morning and 1 tablet in evening as needed for blood pressure greater than 150  . Melatonin 10 MG CAPS Take 10 capsules by mouth daily.  . meloxicam (MOBIC) 15 MG tablet Take by mouth.  . memantine (NAMENDA) 10 MG tablet TAKE 1 TABLET TWICE DAILY  . metFORMIN (GLUCOPHAGE-XR) 500 MG 24 hr tablet TAKE 2 TABLETS EVERY DAY WITH BREAKFAST  .  metoprolol tartrate (LOPRESSOR) 100 MG tablet TAKE 1 AND 1/2 TABLETS TWICE DAILY  . ondansetron (ZOFRAN-ODT) 8 MG disintegrating tablet Take 1 tablet by mouth as needed for nausea.  . pantoprazole (PROTONIX) 40 MG tablet Take 40 mg by mouth 2 (two) times daily.  . polyethylene glycol powder (GLYCOLAX/MIRALAX) 17 GM/SCOOP powder Take 17 g by mouth daily as needed for mild constipation or moderate constipation.   . primidone (MYSOLINE) 50 MG tablet TAKE 1 TABLET AT BEDTIME  . venlafaxine XR (EFFEXOR-XR) 75 MG 24 hr capsule TAKE 1 CAPSULE EVERY DAY WITH BREAKFAST   No facility-administered encounter medications on file as of 12/12/2020.   PSYCHIATRIC HISTORY:  Patient denied ever being diagnosed with or treated for a mental health disorder, and the psychiatric history is purportedly  benign with no prolonged periods of depression or anxiety ever experienced. Current mood was described as " ____"  There is no reported history of alcohol or illicit substance abuse/dependence. No suicidal/homicidal ideation, plan or intent endorsed. No manic or hypomanic episodes were reported. The patient denied ever experiencing any auditory/visual hallucinations. No behavioral or personality changes were endorsed.  FAMILY HISTORY:  They were unaware of any family history of neurological or movement disorders, neurodegenerative conditions, substance abuse, or psychiatric issues.  PSYCHOSOCIAL HISTORY:  There are no indications of learning or intellectual disability, or ADHD.    CURRENT EXAMINATION:  BEHAVIORAL OBSERVATIONS:  Patient was appropriately dressed for season and situation and appeared tidy and well-groomed. Stature and height were unremarkable. Patient appeared well-nourished and chronological age. Sensory and motor abilities appeared abnormal. She appeared with limping type gait on the left; crosses left foot over to  right side when stepping. Unsteady on feet.   Patient was friendly and rapport was established. Speech was as expected. The patient was able to understand test directions. Mood was euthymic and affect was mood congruent. Attention and motivation were good. Insight was intact.  Optimal test taking conditions were maintained.  Mental Status: The patient was alert and fully oriented to person, place, time, and situation.  Attention and concentration were as expected.  Fund of information was typical.  Thinking was goal-directed and appeared normal from the perspective of productivity, relevance, and coherence with no preoccupations. The patient was able to form concepts well.  Judgment and decision-making appeared intact.  Insight was full.  Oriented to all spheres. Accurately named the current President and his predecessor.   TESTS ADMINISTERED:  . [place]  (BNT) . Complex Ideational Material (CIM)  . Controlled Oral Word Association Test (COWAT; FAS & Animals)  . Finger Tapping Test (FTT) . Generalized Anxiety Disorder, 7-Item (GAD-7) . Geriatric Depression Scale, (GDS-15); Short Form  . Geriatric Anxiety Inventory (GAI) . Grooved Pegboard . Hand Dynamometer  . RBANS-C Line Orientation  . Rey-O Complex Figure Test (RCFT); Copy trial . Trail Making Test (TMT)  . Wechsler Adult Intelligence Scale, 4th Edition (WAIS-IV) . Wechsler Memory Scale, 4th Edition (WMS-IV); Older Adult Battery  Test Results: Note: Standardized scores are presented only for use by appropriately trained professionals and to allow for any future test-retest comparison. These scores should not be interpreted without consideration of all the information that is contained in the rest of the report. The most recent standardization samples from the test publisher or other sources were used whenever possible to derive standard scores; scores were corrected for age, gender, ethnicity and education when available.   TEST SCORES:  Note: This summary of test scores accompanies the interpretive  report and should not be considered in isolation without reference to the appropriate sections in the text. Descriptors are based on appropriate normative data and may be adjusted based on clinical judgment. The terms "impaired" and "within normal limits (WNL)" are used when a more specific level of functioning cannot be determined.    Validity Testing:     Descriptor         WAIS-IV Reliable Digit Span: --- --- Within Expectation         Intellectual Functioning:                 Standard Score Percentile    Wechsler Adult Intelligence Scale (WAIS-IV):  Standard Score/ Scaled Score Percentile    Full Scale IQ  92 30 Average  GAI 92 30 Average  Verbal Comprehension Index: 98 45 Average  Similarities  9 37 Average  Vocabulary 11 63 Average  Information  9 37 Average  Perceptual  Reasoning Index:  86 18 Below Average  Block Design  8 25 Average  Matrix Reasoning  6 9 Below Average  Visual Puzzles 9 37 Average  Working Memory Index: 102 55 Average  Digit Span 8 25 Average  Arithmetic  13 84 Above Average  Processing Speed Index: 89 23 Below Average  Symbol Search  8 25 Average  Coding 8 25 Average         Memory:               Wechsler Memory Scale (WMS-IV): Older Adult     Standard Score Percentile   Auditory Memory Index (AMI) 110 75 Average  Delayed Memory Index (DMI) 104 61 Average  Immediate Memory Index (IMI) 102 55 Average  Visual Memory Index (VMI) 89 23 Below Average        Raw Score (Scaled Score) Percentile    Logical Memory I 37/53 (12) 75 Above Average  Logical Memory II 26/39 (13) 84 Above Average  Logical Memory Recognition 20/23 51-75 Average         Verbal Paired Associates I 25/40 (11) 63 Average   Verbal Paired Associates II 7/10 (11) 63  Average  Verbal Paired Associates Recognition 28/30 26-50 Average       Visual Reproduction I 28/43 (8) 25 Average  Visual Reproduction II 14/43 (8) 25 Average  Visual Reproduction Recognition 4/7 26-50 Average         Attention/Executive Function:               Trail Making Test (TMT): Raw Score (T Score) Percentile    Part A 43 secs., 0 errors (41) 18 Below Average  Part B 109 secs., 1 errors (38) 12 Below Average           Scaled Score Percentile    WAIS-IV Coding: 8 25 Average           Scaled Score Percentile    WAIS-IV Digit Span: 8 25 Average  Forward 8 25 Average  Backward 10 50 Average  Sequencing 8 25 Average           Age-Scaled Score Percentile    Wechsler Memory Scale (WMS-IV) Symbol Span: 9 37 Average           Scaled Score Percentile    WAIS-IV Similarities: 9 37 Average         Language:               [name] Diagnostic Aphasia Examination (BDAE) Raw Score Percentile    Complex Ideational  Material: 12/12 --- Average         Verbal Fluency Test: Raw Score (T Score)  Percentile    Phonemic Fluency (FAS) 29 (39) 14 Below Average  Animal Fluency 19 (48) 42 Average           Raw Score (T Score) Percentile    [name] Naming Test (BNT): 57/60 (52) 58 Average         Visuospatial/Visuoconstruction:          Raw Score Percentile    Clock Drawing: 10/10 --- Within Normal Limits  RCFT, Copy: 29/36 6-10 Well Below Average  RBANS Line Orientation, Form C: 16/20 26-50 Average           Scaled Score Percentile    WAIS-IV Block Design: 8 25 Average  WAIS-IV Matrix Reasoning: 6 9 Below Average  WAIS-IV Visual Puzzles: 9 37 Average         Sensory-Motor:               [place] Grooved Pegboard Test: Raw Score Percentile    Dominant Hand 135 secs., 1 drops  1 Exceptionally Low  Non-Dominant Hand 140 secs., 0 drops  2 Exceptionally Low         Finger Tapping Test: Mean Percentile    Dominant Hand 44 Taps 46 Average  Non-Dominant Hand 25 Taps 2 Exceptionally Low         Hand Dynamometer  Mean Percentile   Dominant Hand 10 Kg. - Below Average  Non-Dominant Hand 4 Kg. - Well Below Average       Mood and Personality:          Raw Score Percentile    Geriatric Depression Scale, Short Form: 1 --- Within Normal Limits  Geriatric Anxiety Inventory: 1 --- Within Normal Limits  GAD-7: 2 --- Within Normal Limits    VALIDITY OF EVALUATION: Scores on objective and embedded measures of performance validity were within normal limits, and there were no behavioral manifestations that suggested suboptimal effort or poor test engagement. As such, the following test results are considered valid and interpretable.  SUMMARY OF TEST RESULTS:  Premorbid verbal intellectual abilities were estimated to have been within the average range based on a test of vocabulary ability. Current intellectual functioning was found to be average for her age. Verbal Comprehension Index Score of 98 (45th percentile) is average compared to age matched peers. Perceptual Reasoning Index Score of 86  (18th percentile) is below average and slightly weaker (but not statistically significant) than her verbal abilities. Her ability to arrange blocks to match a visual design was average. Psychomotor processing speed was also below average (23rd percentile). Auditory attention and working memory were average (55th percentile).   Other language abilities including confrontation naming and semantic verbal fluency were average. Auditory comprehension of complex ideational material was average. With regard to verbal memory, encoding and acquisition of a list of word pairs was average. After a 20 minute delay, free recall was average. Performance on a yes/no recognition task was average. On another verbal memory test, encoding and acquisition of contextual auditory information (i.e., short stories) was above average. After a delay, free recall was also above average. Performance on a yes/no recognition task was average. With regard to non-verbal memory, delayed free recall of visual information was average. Executive functioning was mixed overall. Mental flexibility and set-shifting were below average on Trails B. Verbal fluency with phonemic search restrictions was below average. Verbal abstract reasoning was average. Non-verbal abstract reasoning was  below average. Performance on a clock drawing task was average. Her ability to copy of complex visual spatial figure was  well below average. Spatial judgement was average. Motor coordination in non-dominant hand appears significantly weaker than dominant side.  On a self-report measure of mood, the patient's responses were not indicative of clinically significant depression at the present time. On self-report measures of anxiety, the patient did not endorse any clinically significant generalized anxiety at the present time.   CLINICAL IMPRESSIONS:   While normal neuropsychological performance does not necessarily rule out the possibility of some mild and/or  transient cognitive problems, it does provide strong evidence against any very significant impairment.  REFERRING DIAGNOSIS:  Memory changes  FINAL DIAGNOSES (ICD-10 considerations):    RECOMMENDATIONS: 1. Follow-up with Carreiro Givens, NP (Neurology) and Dr. Madilyn Fireman.  . No treatment for cognition warranted.  . Consider treatment with cholinesterase inhibitor.  . Consider supplementation with memantine, an NMDA antagonist, after the cholinesterase inhibitor is recorded as well tolerated.  . Given report of visual hallucinations and delusions, could consider an off label trial of Nuplazid. . Encourage limiting or eliminating the use of XXX and other cognitively impairing medications or medications with strong anticholinergic side effects.  - Patient is encouraged to discuss the current medication regimen with prescribing providers. There are several medications that are prescribed that can cause problems with memory and thinking. Of course, there is a cost-benefit analysis that must be considered carefully and it may not be possible to treat all of the patient's symptoms without some cognitive side effects. . Sinemet and possibly zonisamide for DLB (if needed). Dose donepezil in the morning. There is mixed evidence for cognitive and neuropsychiatric symptom improvement with memantine but is a better option than antipsychotics; in this domain, 12.5 mg of Seroquel is the recommended starting dose, though this can worsen parkinsonism and cognition.  . While the U.S. Food and Drug Administration (FDA) has not approved any drugs specifically to treat symptoms of vascular induced cognitive deficits, there is evidence from clinical trials that drugs approved to treat Alzheimer's symptoms (i.e., cholinesterase inhibitors and memantine) may offer modest benefit. Whether this is initiated is best left determined by the patient's care providers.   . If agitation worsens, or begins to significantly affect  daily life, then consider Nuedexta.  . Consider obtaining neuroimaging (i.e., brain MRI scan or head CT scan if an MRI is contraindicated).  . Could consider spinal tap to assess for amyloid beta and tau proteins in cerebrospinal fluid, though this procedure is invasive and adds marginal additive value over clinical diagnosis.  . Could consider spinal to assess for oligoclonal bands, elevated levels of IgG antibodies, or other proteins that are the breakdown products of myelin knowing that an abnormal immune response in CSF is found in other disease, and some 5-10% of patients with MS never exhibit these abnormalities.  . Consider ordering labs to assess for other possible etiologies of cognitive disruption (e.g., TSH, vitamin B-12, methylmalonic acid, homocysteine, RPR, folate, vitamin D).  . Considering the noted medical history, it is recommended that the patient strictly comply with prescribed medical treatments for cerebrovascular risk factors (e.g., high cholesterol, high blood pressure, sleep apnea, diabetes). . Considering the noted medical history, the patient is at increased risk for progression of cognitive impairment. Therefore, it is recommended that the patient aggressively manage any modifiable risk factors for further cognitive decline such as strict compliance with prescribed medical treatments for any cerebrovascular risk factors (e.g., high cholesterol,  high blood pressure, sleep apnea, diabetes). . Non-pharmacological treatments for Parkinson's disease:  - Exercise and stretching - Voice therapy - Speech/swallowing therapy - Balance-related exercises - Occupational therapy  2. This individual appears to be experiencing clinical symptoms of depression and anxiety.  As such, engaging in psychotherapy should be considered to better manage mood symptomology. Therein the patient can work on identifying triggers for depression and anxiety as well as work towards developing healthy coping  mechanisms and implementing lifestyle modifications that can help to support ongoing mental health.    3. If XXX is able to obtain improved relief from depression and anxiety, the patient may consider a return to work in the future.  It is suggested that the return to work might initially be on a part-time basis.  Moreover, it is suggested that work responsibilities might be modified, such that the patient would have less of a managerial and supervisory role, with greater emphasis on routine clerical type tasks, with attention to the compensatory strategies noted.   4. The Memory Counseling Program at the [facility] in [place] provides counseling services for individuals diagnosed with mild cognitive impairment, Alzheimer's disease, or another form of dementia, as well as to their family members. Services include individual, couple, and family counseling, as well as support groups, all of which provide a safe environment to talk about the journey with mild cognitive impairment or dementia, learn as much as possible about the disease, problem solve some of the common challenges encountered with memory and cognitive loss, and strengthen relationships. For more information, they can be contacted at [phone].   5. Due to the nature and severity of the symptoms noted during this evaluation, it is recommended that the patient remain under 24-hour care and supervision, as the cognitive deficits noted represent a safety risk if left alone for extended periods of time.  As long as a spouse or family member that can stay with the patient remains healthy, no changes are needed in living situation.   6. Given the family reports of episodes of poor judgment resulting in possible safety issues, there may be benefit from a home safety evaluation with someone such as an occupational therapist.  7. Continued assistance with certain activities of daily living (e.g., medication and financial management) is  recommended.    8. It is recommended that the severity of the patient's impairments is considered when assessing the level of asset management required. For example, given impairment higher-level reasoning and problem-solving skills, it is recommended that the patient consult with a family member or another trusted advisor prior to making any important medical, legal, or financial decisions. Establishing or continuing to rely upon someone who has Power of Musician and medical decision-making is recommended.    9. The patient should limit or refrain from driving, as deficits noted on testing could affect one's ability to safely operate a motor vehicle.  At minimum, it is recommended that this person undergo a formal driving evaluation. Could Wellsite geologist at: [phone].    10. It may be beneficial to contact the [employer] [employer] in [place] to find alternative methods of transportation and identify other services that may be beneficial for the patient now or in the future. They can be reached at [phone].  11. Regular medical care is important for an individual with dementia. Therefore, make sure to maintain regular appointments with all medical providers. In addition, schedule these appointments during the patient's best time  of day.  12. Reading the 36-Hour Day by Freddi Che and Rabins may be helpful in providing educational support for family members.  In addition, attending a dementia support group may help everyone share some of their feelings with others who are encountering similar problems.  13. SalonLookup.es is a link is to The Caregiver's Handbook: A comprehensive guide with tools, resources, and in-depth solutions to some of caregiving's toughest challenges.   14. The patient should wear identification at all times, in case the individual becomes lost and disoriented.    15. The cognitive and/or psychiatric symptoms noted put the patient at a significant vocational disadvantage. As such, it may be beneficial to contact a vocational rehabilitation service. They can assist with finding employment opportunities that are within a person's capabilities, or teach ways to perform work-related activities more effectively. This person may also require more intense retraining of procedures he or she has already learned.   16. The patient is encouraged to attend to lifestyle factors for brain health (e.g., regular physical exercise, good nutrition habits, regular participation in cognitively-stimulating activities, and general stress management techniques), which are likely to have benefits for both emotional adjustment and cognition.  In fact, in addition to promoting general good health, regular exercise incorporating aerobic activities (e.g., brisk walking, jogging, bicycling, etc.) has been demonstrated to be a very effective treatment for depression and stress, with similar efficacy rates to both antidepressant medication and psychotherapy. And for those with orthopedic issues, water aerobics may be particularly beneficial.  17. Included with the report is a flyer that includes activities that have therapeutic value and might be useful to keep you cognitively stimulated; these lists were compiled by therapists at the [facility], and are categorized by level of difficulty; this is not a substitute for therapy. This information can also be found at the following:  https://www.barrowneuro.org/get-to-know-barrow/centers-programs/neurorehabilitation-center/neuro-rehab-apps-and-games/  18. Nutritional factors can have a significant effect on psychological and emotional status, as well as overall brain functioning.  The following general recommendations have been associated with improvements in depression and other psychological symptoms, as well as lower risk for  dementia and other forms of cognitive impairment.  Please discuss these recommendations with your physician and/or dietitian before initiating:  . Consume a wide variety of fresh fruits and vegetables, particularly including brightly colored items such as berries, oranges, tomatoes, peppers, carrots, broccoli, spinach, dark green lettuces, sweet potatoes, etc., all of which are high in vitamins and antioxidants.  . Consume foods that are high in fiber, such as legumes (e.g., beans, peas, lentils) and foods made from whole grains (e.g., whole wheat bread and pasta)  . Consume a significant amount of omega-3 essential fats and oils.  These can be found in natural food sources such as salmon and other fatty fish, and also products made from flax seed and flax seed oil.  Alternately, dietary supplementation with fish oil capsules and flax seed oil capsules is a good way to boost one's level of omega-3 consumption.  It is important to check with your doctor before taking these supplements, especially if you take blood thinning medication.  . If you do not already do so, consider taking a quality multivitamin supplement under the guidance of your primary care physician.   . Consider keeping consumption of the following foods to a minimum: 1) foods made from white flour and white sugar; 2) artificial sweeteners; 3) deep-fried foods; 4) animal fat other than fish; 5) any foods containing "hydrogenated" or "trans" fats; 6) most other  types of highly processed packaged/prepared foods.   19. Neuropsychological re-evaluation is recommended in approximately one year (or sooner) to monitor treatment efficacy, to assist with the management of the patient (start or continue rehab or pharmacological therapy), to determine any clinical and functional significance of brain abnormality over time, as well as to document any potential improvement or decline in cognitive functioning. Lastly, any follow-up testing will help  delineate the specific cognitive basis of any new functional complaints. If you wish to make this follow-up appointment, please contact our office at [phone].  20. Neuropsychological re-evaluation is recommended as needed to monitor treatment efficacy, to assist with the management of the patient (start or continue rehab or pharmacological therapy), to determine any clinical and functional significance of brain abnormality over time, as well as to document any potential improvement or decline in cognitive functioning. Lastly, any follow-up testing will help delineate the specific cognitive basis of any new functional complaints. If you wish to make a follow-up appointment, please contact our office at [phone].  21. Try to keep in mind that common word finding errors are not necessarily the start of a dreadful decline. Over-focusing on these errors can contribute to further distraction and emotions that can detract from effective retrieval of words; this in turn, can lead to greater distress and more difficulties with recalling the specific word you were looking for to begin with.   The following are several strategies that may help:  . Performance will generally be best in a structured, routine, and familiar environment, as opposed to situations involving complex problems.  Marlene Lard a place to keep your keys, wallet, cell phone, and other personal belongings. . Take time to register and process information to be remembered. Deeper encoding of information can be gained by forming a mental picture, making meaningful associations, connecting new information to previously learned and related information, paraphrasing and repetition.  . To the extent possible, multitasking should be avoided; break down tasks into smaller steps to help get started and to keep from feeling overwhelmed. And if there are difficulties in organization and planning, maintaining a daily organizer to help keep track of  important appointments and information may be beneficial.   . Memory problems may at least be minimally addressed using compensatory strategies such as the use of a daily schedule to follow, memos, portable recorder, a centrally located bulletin board, or memory notebook. A large calendar, placed in a highly visible location would be valuable to keep track of dates and appointments.  In addition, it would be helpful to keep a log of all of medical appointments with the name of the doctor, date of visit, diagnoses, and treatments.  . Use of a medication box is recommended to ensure compliance and decrease confusion regarding medication dosages, times, and dates. . To aid in managing problems with attention, the patient may consider using some of the following strategies: o The patient should simplify tasks.  There may be a need to break overly complex activities into simple step-by-step tasks, keep these steps written down in a note book and then check them off as they are completed which will help to stay on task and make sure the whole task is finished.  o The patient should set deadlines for everything, even for seemingly small tasks, prioritize time-sensitive tasks and write down every assignment, message, or important thought. o The patient is encouraged to use timers and alarms to stay on track and take breaks at regular intervals. Avoid piles of  paperwork or procrastination by dealing with each item as it comes in.  Community resources are available regarding the care of adults with dementia. The Alzheimer's Association has a website that offers tips and recommendations to aide families in caring for family members with dementia (even for those without Alzheimer's disease). VerifiedMovies.de.  In addition, a document that contains valuable resources for dementia care is provided.   Community resources are available for those diagnosed with multiple sclerosis. The National Multiple Sclerosis Society  has a website that offers numerous tips and recommendations to aide affected persons. There website is as follows: http://www.davis.biz/.   Community resources are available for those diagnosed with Parkinson's disease.  There website is as follows: OrderTeeth.com.cy.    Resources are available for those diagnosed with Lewy Body Dementia disease.  There website is as follows: SocialListing.com.br  Community resources are available for those diagnosed with atypical parkinsonism, including progressive supranuclear palsy and corticobasal degeneration. There is a support group for PSP/CBD/MSA patients and caregivers in [place] at the [facility]. Contact [phone] for more information. In addition, [employer] is an organization that provides information, resources, and online support groups for individuals diagnosed with PSP and other related disorders, and can be accessed at BelizeBus.at.  Techniques for making living spaces safer:  . Make the stovetop and oven inaccessible by removing or covering the knobs . Secure all medications in a safe place . Unplug appliances when not in use . Reduce tripping hazards such as moving any cords out of the way . Could consider marking the edges of steps with colored tape . Install night lights throughout the house  Preventing fraudulent activity: individuals with Alzheimer's disease and other dementias may be at increased risk of being taken advantage of. Consider the following:  . Limit access to credit cards or cash . Could consider placing a sign on the front door such as "No Solicitations" . Could contact the [place] "Do Not Call" list and add the person's number. They can be reached at [phone]  Techniques of communicating with a person who has dementia/memory impairment:  . Maintain a regular schedule for as many activities as possible. . Practice reality  orientation. . Repeat information quietly and firmly. Marland Kitchen Keep the voice low and calm and be rational and understanding. . Talk in a warm and encouraging manner. . Talk gently and calmly; smile and be relaxed. . Use normal voice tone, do not be condescending. . Talk in a quiet place without distraction. . Show respect and acceptance. . Use one sentence for each idea and check to see if the patient understands before proceeding. . Do not interrupt, signal acceptance of message by nods, or smiles when appropriate, and repeat what the patient has said when the message is complete. Marland Kitchen Keep stress, arguing, disagreements, and verbal tirades to a minimum, as any additional stressor on the patient will cause continued and more rapid deterioration.  In general, the best way to manage the behavioral and psychological symptoms of dementia such as agitation involves behavioral strategies such as using the three R's.  o Redirection (help distract your loved one by focusing their attention on something else, moving them to a new environment, or otherwise engaging them in something other than what is distressing to them)  o Reassurance (reassure them that you are there to take care of them and that there is nothing they need to be worried about), and  o Reconsidering (consider the situation from their perspective and  try to identify if there is something about the situation or environment that may be triggering their reaction).  Hallucinations are false perceptions of objects or events involving the senses. These false perceptions can be caused by changes within the brain that result from Alzheimer's, usually in the later stages of the disease, although they can also occur as a result of vision loss. The following strategies may be helpful in responding to the patient's hallucinations: o Offer reassurance - Respond in a calm, supportive manner. You may want to respond with, "Don't worry. I'm here. I'll protect  you. I'll take care of you." - Gentle patting may turn the person's attention toward you and reduce the hallucination. - Acknowledge the feelings behind the hallucination and try to find out what the hallucination means to the individual. You might want to say, "It sounds as if you're worried" or "I know this is frightening for you." o Use distractions - Suggest a walk or move to another room. Frightening hallucinations often subside in well-lit areas where other people are present. - Try to turn the person's attention to music, conversation or activities you enjoy together. o Respond honestly - If the person asks you about a hallucination or delusion, be honest. For example, if he or she asks, "Do you see him?" you may want to answer with, "I know you see something, but I don't see it." This way, you're not denying what the person sees or hears, but you avoid an argument. o Modify the environment - Check for sounds that might be misinterpreted, such as noise from a television or an air conditioner. - Look for lighting that casts shadows, reflections or distortions on the surfaces of floors, walls and furniture. Turn on lights to reduce shadows. - Cover mirrors with a cloth or remove them if the person thinks that he or she is looking at a stranger.  Delusions (firmly held beliefs in things that are not real) may occur in middle-to-late-stage dementia. Confusion and memory loss - such as the inability to remember certain people or objects - can contribute to these untrue beliefs. A person with dementia may believe a family member is stealing his or her possessions or that he or she is being followed by the police. This kind of suspicious delusion is sometimes referred to as paranoia. Although not grounded in reality, the situation is very real to the person with dementia. Keep in mind that a person with dementia is trying to make sense of his or her world with declining cognitive function. A delusion is  not the same thing as a hallucination. While delusions involve false beliefs, hallucinations are false perceptions of objects or events that are sensory in nature. The following strategies may be helpful in responding to delusions: o Don't take offense. Listen to what is troubling the person, and try to understand that reality. Then be reassuring, and let the person know you care. o Don't argue or try to convince. Allow the individual to express ideas. Acknowledge his or her opinions. o Offer a simple answer. Share your thoughts with the individual, but keep it simple. Don't overwhelm the person with lengthy explanations or reasons. o Switch the focus to another activity. Engage the individual in an activity, or ask for help with a chore. o Duplicate any lost items. If the person is often searching for a specific item, have several available (if possible). For example, if the individual is always looking for his or her wallet, purchase two of the  same kind.  FEEDBACK TO PATIENT: Mia [name] will return for a feedback appointment on 12/17/20 to review the results of her neuropsychological evaluation with this provider.  Thank you for your referral of Mia [name]. If you have any questions, please contact us at [phone].   This report is intended solely for the confidential review and use by the referring professional to assist in diagnostic and medical decision making needs.  This report should not be released to a third party without proper consent. [NOTE: data can be made available to qualified professionals with permission from the patient or legal representative/caregiver]    ____________________________________ Alfonso Ellis, PsyD,  Licensed Psychologist (Provisional) Clinical Neuropsychologist         Billing/Service Summary:   Neurobehavioral Status Exam:  Base: [phone] Add-on: [phone]  Direct clinical assessment (interview) of the patient and collateral interviews (as  appropriate) by the licensed psychologist                                                                                      Total time: 120 minutes       Total units:  1 1  Neuropsychological Testing Evaluation Services:   Base: [id number] Add-on: [phone]  Records review & clarify referral question; Patient symptom management; clinical decision making/battery modification; Integration/report generation; and, post-service work   Total time:  120 minutes  Total units:  1 1  Designer, fashion/clothing by Psychologist:  Base: [id number] Add-on: [phone]  Test Administration (face-to-face) Scoring (Non-face-to-face)                                                                                       Total time: 240 minutes      Total units:  1 7

## 2020-12-17 ENCOUNTER — Encounter (HOSPITAL_BASED_OUTPATIENT_CLINIC_OR_DEPARTMENT_OTHER): Payer: Medicare Other | Admitting: Psychology

## 2020-12-17 ENCOUNTER — Other Ambulatory Visit: Payer: Self-pay

## 2020-12-17 ENCOUNTER — Ambulatory Visit: Payer: Medicare Other

## 2020-12-17 DIAGNOSIS — F0280 Dementia in other diseases classified elsewhere without behavioral disturbance: Secondary | ICD-10-CM | POA: Diagnosis not present

## 2020-12-17 DIAGNOSIS — R413 Other amnesia: Secondary | ICD-10-CM | POA: Diagnosis not present

## 2020-12-17 DIAGNOSIS — F02A0 Dementia in other diseases classified elsewhere, mild, without behavioral disturbance, psychotic disturbance, mood disturbance, and anxiety: Secondary | ICD-10-CM

## 2020-12-17 NOTE — Progress Notes (Signed)
NEUROPSYCHOLOGICAL EVALUATION - CONFIDENTIAL  STOP! If you are not the patient, the patient's legal guardian/caregiver, or an individual for whom the patient has signed a release of information then do not proceed and contact the above mentioned provider. Thank you!   PATIENT:    Mia [name]. Madill DATE OF BIRTH:   09-13-1951 PROCEDURE:  Neuropsychological evaluation  DATE OF INTERVIEW: 10/02/2020 DATE OF TESTING:   12/09/2020 DATE OF REPORT:    12/12/2020 REFERRAL SOURCE:  Vaquerano Givens, NP (Neurology) of [facility]  MEDICAL NECESSITY:  To evaluate cognitive and emotional functioning in light of suspected memory loss and cognitive dysfunction and to assess for dementia.   SOURCES OF INFORMATION: The following information was gathered from a clinical interview with patient and husband as well as from a review of available medical records. The patient expressed understanding of the purpose of the evaluation and consented to all procedures.   BACKGROUND INFORMATION:  REASON FOR REFERRAL: Lolly Glaus Nicholls is a 70 y.o. female referred by Ricotta Givens, NP (Neurology) to assess her current level of cognitive functioning and assist in differential diagnosis. The current evaluation consisted of a review of available medical records, an interview with the patient and husband, and the completion of a neuropsychological testing battery. Informed consent was obtained.  HISTORY OF PRESENTING PROBLEM: Mrs. Lader is a 70 year old right-handed, [place], female, who presented for a formal neuropsychological evaluation with neuromuscular (e.g., essential tremor, impaired gait w/left sided drift, recent falls, dropping things more, weakness, etc.) and cognitive complaints across domains; insidious onset (circa 2011) and gradual pattern of decline. In particular, complaints that she described include: recurrent falls and gait instability (w/left sided drift), essential tremor,  clumsiness/dropping things more, weakness in arms and legs,  forgetfulness and memory loss, reduced new learning, word finding difficulty and paraphasic errors,  slowed processing speed, fatigue and reduced energy daily, low frustration tolerance, and disinhibition.  problems walking due to right (dominant side) foot drop and hemiparesis, cerebellar ataxia,  She remains independent for most activities of daily living (ADLs) but requires assistance with cooking and grocery shopping; husband helps cook and perform other household chores and tasks. Most ADLs/IADLs take twice as long to complete. She manages her own finances and continues to drive.  She described one incident of getting lost while traveling down a familiar highway in [place] visiting her mother; recalled thinking to herself "something is not right". She endorsed some mild behavioral and/or personality changes such as getting angry over trivial matters and problems inhibiting profanity; recent change within the last 2-3 years.    She is currently treated with Aricept and Namenda for her memory complaints. She contracted Covid and required hospitalization circa 10/2019. She has reported problems with fatigue and decreased information processing speed since.She has recurrent neck and lower back pain and currently being followed by anorthopedic surgeon. Previously diagnosed with left piriformis syndrome. However, she has continued to show diffuse bilateral lower extremity weakness, not explained by left piriformis syndrome.  EMG/NCS 12/16/2018 was normal.  Followed by [name], MD of [facility]. Currently taking Effexor-XR for fibromyalgia. On Mysoline for essential tremor.   She has a history of low B12 levels, she gets B12 shots once a month.   The patient was recently seen by Bartok Givens, NP of [facility]. This provider's note in EMR on 08/28/2020 indicates the following: "In regards to her  memory she feels that it has gotten worse since having Covid in January. She states  that she may forget to pay bills sometimes forgets people's names that she is worked with over 10 years ago. She is able to complete all ADLs independently. She has noticed some change in her balance. States that she recently had a fall and injured her elbow. Feels that her tremor has been well controlled with Mysoline. Primarily just affects the hands. Reports that fibromyalgia continues to be fairly controlled with Effexor. She states that she does have fibrofog at times".   MEDICAL HISTORY:  Birth and developmental history are benign. Any history of traumatic brain injury was denied. According to medical records, the patient's problems list includes, but may not be limited, to the following:   Past Medical History:  Diagnosis Date  . Anal fissure   . Anemia   . Arthritis   . Blood transfusion   . C. difficile colitis   . Chest pain    a. 01/2013 MV: EF 59%, no ischemia.  . Chronic Dyspnea    a. 01/2013 Echo: EF 60-65%, Gr 1 DD, PASP 50mmHg.// Echo 01/2020: EF 60-65, no RWMA, GR 1 DD, GLS -21.6%, normal RV SF, trivial MR   . COVID-19   . DDD (degenerative disc disease), cervical   . DDD (degenerative disc disease), lumbar   . Diabetes mellitus   . Fibromyalgia   . Gall stones   . GERD (gastroesophageal reflux disease)    gastritis  . Hemorrhoids   . Hypertension   . Interstitial cystitis   . MCI (mild cognitive impairment) 11/25/2017  . Memory difficulty 12/14/2013  . Neuromuscular disorder ([place])    sclerosis  . Osteoporosis   . Peptic ulcer   . PONV (postoperative nausea and vomiting)   . Pseudogout   . Raynaud phenomenon   . Rectal bleeding   . Sleep apnea    a. on cpap.  Marland Kitchen SVT (supraventricular tachycardia) ([place])   . Syncope 09/10/2015  . Thyroid disease    hypothyroidism  . Tremor, essential 05/14/2016   IMAGING:   MRI of the brain without contrast (09/23/15)  showed the  following: 1. A few scattered T2/FLAIR hyperintense foci in the subcortical or deep white matter of the frontal lobes. This is a nonspecific finding most likely represents minimal age-related chronic microvascular ischemic changes. Migraine related changes or sequela of cardio-emboli or vasculitis are less likely. Demyelination would be unlikely to give this pattern. 2, There are no acute findings. 3. Brain volume appears normal for age.  MRI of lumbar spine without contrast (01/11/2017) showed the following:  1. Mild multilevel spondylosis of the lumbar spine as described. 2. Mild right foraminal narrowing at L2-3 and L3-4. 3. Mild foraminal narrowing bilaterally at L4-5 is worse on the left. 4. Multilevel facet disease.  MRI of cervical spine without contrast (09/19/2017) showed the following:  1. Severe left C4-5 facet arthrosis, which has slightly worsened compared to the study of 11/23/2015. The degree of left neural foraminal narrowing, however, remains mild. 2. C5-C6 ACDF with widely patent spinal canal. No foraminal encroachment.  MRI of the brain with and without contrast (09/01/2020) showed the following: 1.   Minimal generalized cortical atrophy, typical for age but slightly progressed compared to the 2016 MRI 2.   Few punctate T2/FLAIR hyperintense foci the subcortical white matter.  This is a nonspecific finding but most likely represents minimal chronic microvascular ischemic change, typical for age. 3.   There is a normal enhancement pattern and no acute findings.  CURRENT MEDICATIONS:  Outpatient Encounter Medications as  of 12/12/2020  Medication Sig  . ipratropium (ATROVENT) 0.03 % nasal spray Place 2 sprays into both nostrils every 12 (twelve) hours.  . Cyanocobalamin (VITAMIN B-12 IJ) Inject 1 mL as directed every 30 (thirty) days.   Marland Kitchen denosumab (PROLIA) 60 MG/ML SOLN injection Inject 60 mg into the skin every 6 (six) months. Administer in upper arm, thigh, or  abdomen  . donepezil (ARICEPT) 10 MG tablet TAKE 1 TABLET AT BEDTIME  . fluticasone (FLONASE) 50 MCG/ACT nasal spray Place 2 sprays into both nostrils daily.  Marland Kitchen gabapentin (NEURONTIN) 300 MG capsule TAKE 1 TO 2 CAPSULES BY MOUTH UP TO TWO TIMES DAILY AS NEEDED  . leflunomide (ARAVA) 20 MG tablet TAKE ONE TABLET BY MOUTH DAILY  . losartan (COZAAR) 50 MG tablet Take 2 tablets (100 mg total) by mouth daily. Take 1 tablet in the morning and 1 tablet in evening as needed for blood pressure greater than 150  . Melatonin 10 MG CAPS Take 10 capsules by mouth daily.  . meloxicam (MOBIC) 15 MG tablet Take by mouth.  . memantine (NAMENDA) 10 MG tablet TAKE 1 TABLET TWICE DAILY  . metFORMIN (GLUCOPHAGE-XR) 500 MG 24 hr tablet TAKE 2 TABLETS EVERY DAY WITH BREAKFAST  . metoprolol tartrate (LOPRESSOR) 100 MG tablet TAKE 1 AND 1/2 TABLETS TWICE DAILY  . ondansetron (ZOFRAN-ODT) 8 MG disintegrating tablet Take 1 tablet by mouth as needed for nausea.  . pantoprazole (PROTONIX) 40 MG tablet Take 40 mg by mouth 2 (two) times daily.  . polyethylene glycol powder (GLYCOLAX/MIRALAX) 17 GM/SCOOP powder Take 17 g by mouth daily as needed for mild constipation or moderate constipation.   . primidone (MYSOLINE) 50 MG tablet TAKE 1 TABLET AT BEDTIME  . venlafaxine XR (EFFEXOR-XR) 75 MG 24 hr capsule TAKE 1 CAPSULE EVERY DAY WITH BREAKFAST   No facility-administered encounter medications on file as of 12/12/2020.   PSYCHIATRIC HISTORY:  Patient denied ever being diagnosed with or treated for a mental health disorder, and the psychiatric history is purportedly benign with no prolonged periods of depression or anxiety ever experienced. Current mood was described as "joyful".    There is no reported history of alcohol or illicit substance abuse/dependence. No suicidal/homicidal ideation, plan or intent endorsed. No manic or hypomanic episodes were reported. The patient denied ever experiencing any auditory/visual  hallucinations. No behavioral or personality changes were endorsed.  FAMILY HISTORY:  Problem Relation Age of Onset  . Heart attack Mother   . Stroke Mother   . Diabetes Mother   . Heart disease Mother   . Colon polyps Mother   . Breast cancer Maternal Grandmother   . Wilson's disease Maternal Grandmother   . Pancreatic cancer Maternal Grandfather   . Heart disease Father   . Heart attack Father   . Motor neuron disease Sister   . Stroke Sister   . Lupus Sister   . Dementia Sister   . Arthritis/Rheumatoid Sister   . Asthma Sister   . Colon polyps Sister   . Irritable bowel syndrome Sister    PSYCHOSOCIAL HISTORY:  There are no indications of learning or intellectual disability, or ADHD.    CURRENT EXAMINATION:  BEHAVIORAL OBSERVATIONS:  Patient was appropriately dressed for season and situation and appeared tidy and well-groomed. Stature and height were unremarkable. Patient appeared well-nourished and chronological age. Sensory and motor abilities appeared abnormal. She drifted to left when walking and was observed with neuropathic gait; very unsteady on feet. Did not use cane or other  form of assistance. Patient was friendly and rapport was established. Speech was notable for some problems with pronunciation and word subsitution errors. The patient was able to understand test directions. Mood was euthymic and affect was mood congruent. Attention and motivation were good. Insight was intact.  Optimal test taking conditions were maintained.  Mental Status: The patient was alert and fully oriented to person, place, time, and situation.  Attention and concentration were as expected.  Fund of information was typical.  Thinking was goal-directed and appeared normal from the perspective of productivity, relevance, and coherence with no preoccupations. The patient was able to form concepts well.  Judgment and decision-making appeared intact.  Insight was full.  Oriented to  all spheres. Accurately named the current President and his predecessor.   TESTS ADMINISTERED:  . [place] (BNT) . Complex Ideational Material (CIM)  . Controlled Oral Word Association Test (COWAT; FAS & Animals)  . Finger Tapping Test (FTT) . Generalized Anxiety Disorder, 7-Item (GAD-7) . Geriatric Depression Scale, (GDS-15); Short Form  . Geriatric Anxiety Inventory (GAI) . Grooved Pegboard . Hand Dynamometer  . RBANS-C Line Orientation  . Rey-O Complex Figure Test (RCFT); Copy trial . Trail Making Test (TMT)  . Wechsler Adult Intelligence Scale, 4th Edition (WAIS-IV) . Wechsler Memory Scale, 4th Edition (WMS-IV); Older Adult Battery  Test Results: Note: Standardized scores are presented only for use by appropriately trained professionals and to allow for any future test-retest comparison. These scores should not be interpreted without consideration of all the information that is contained in the rest of the report. The most recent standardization samples from the test publisher or other sources were used whenever possible to derive standard scores; scores were corrected for age, gender, ethnicity and education when available.   TEST SCORES:  Note: This summary of test scores accompanies the interpretive report and should not be considered in isolation without reference to the appropriate sections in the text. Descriptors are based on appropriate normative data and may be adjusted based on clinical judgment. The terms "impaired" and "within normal limits (WNL)" are used when a more specific level of functioning cannot be determined.    Validity Testing:     Descriptor         WAIS-IV Reliable Digit Span: --- --- Within Expectation       Cognitive Screening       Raw Score Percentile   Brief Cognitive Status Exam (WMS-IV)     Total Score 55 - Average       Intellectual Functioning:               Wechsler Adult Intelligence Scale (WAIS-IV):  Standard Score/ Scaled Score  Percentile    Full Scale IQ  92 30 Average  GAI 92 30 Average  Verbal Comprehension Index: 98 45 Average  Similarities  9 37 Average  Vocabulary 11 63 Average  Information  9 37 Average  Perceptual Reasoning Index:  86 18 Below Average  Block Design  8 25 Average  Matrix Reasoning  6 9 Below Average  Visual Puzzles 9 37 Average  Working Memory Index: 102 55 Average  Digit Span 8 25 Average  Arithmetic  13 84 Above Average  Processing Speed Index: 89 23 Below Average  Symbol Search  8 25 Average  Coding 8 25 Average         Memory:               Wechsler Memory Scale (WMS-IV): Older Adult  Standard Score Percentile   Auditory Memory Index (AMI) 110 75 Average  Delayed Memory Index (DMI) 104 61 Average  Immediate Memory Index (IMI) 102 55 Average  Visual Memory Index (VMI) 89 23 Below Average        Raw Score (Scaled Score) Percentile    Logical Memory I 37/53 (12) 75 Above Average  Logical Memory II 26/39 (13) 84 Above Average  Logical Memory Recognition 20/23 51-75 Average         Verbal Paired Associates I 25/40 (11) 63 Average   Verbal Paired Associates II 7/10 (11) 63  Average  Verbal Paired Associates Recognition 28/30 26-50 Average       Visual Reproduction I 28/43 (8) 25 Average  Visual Reproduction II 14/43 (8) 25 Average  Visual Reproduction Recognition 4/7 26-50 Average         Attention/Executive Function:               Trail Making Test (TMT): Raw Score  (T-Score) Percentile    Part A 43 secs., 0 errors (41) 18 Below Average  Part B 109 secs., 1 errors (38) 12 Below Average           Scaled Score Percentile    WAIS-IV Coding: 8 25 Average           Scaled Score Percentile    WAIS-IV Digit Span: 8 25 Average  Forward 8 25 Average  Backward 10 50 Average  Sequencing 8 25 Average           Age-Scaled Score Percentile    Wechsler Memory Scale (WMS-IV) Symbol Span: 9 37 Average           Scaled Score Percentile    WAIS-IV Similarities: 9 37 Average          Language:               [name] Diagnostic Aphasia Examination (BDAE) Raw Score Percentile    Complex Ideational Material: 12/12 --- Average         Verbal Fluency Test: Raw Score  (T-Score) Percentile    Phonemic Fluency (FAS) 29 (39) 14 Below Average  Animal Fluency 19 (48) 42 Average           Raw Score  (T-Score) Percentile    [name] Naming Test (BNT): 57/60 (52) 58 Average         Visuospatial/Visuoconstruction:          Raw Score Percentile    Clock Drawing: 10/10 --- Within Normal Limits  RCFT, Copy: 29/36 6-10 Well Below Average  RBANS Line Orientation, Form C: 16/20 26-50 Average           Scaled Score Percentile    WAIS-IV Block Design: 8 25 Average  WAIS-IV Matrix Reasoning: 6 9 Below Average  WAIS-IV Visual Puzzles: 9 37 Average         Sensory-Motor:               [place] Grooved Pegboard Test: Raw Score Percentile    Dominant Hand 135 secs., 1 drops  1 Exceptionally Low  Non-Dominant Hand 140 secs., 0 drops  2 Exceptionally Low         Finger Tapping Test: Mean Percentile    Dominant Hand 44 Taps 46 Average  Non-Dominant Hand 25 Taps 2 Exceptionally Low         Hand Dynamometer  Mean Percentile   Dominant Hand 10 Kg. - Below Average  Non-Dominant Hand 4 Kg. - Well Below  Average       Mood and Personality:          Raw Score Percentile    Geriatric Depression Scale, Short Form: 1 --- Within Normal Limits  Geriatric Anxiety Inventory: 1 --- Within Normal Limits  GAD-7: 2 --- Within Normal Limits   VALIDITY OF EVALUATION: Scores on objective and embedded measures of performance validity were within normal limits, and there were no behavioral manifestations that suggested suboptimal effort or poor test engagement. As such, the following test results are considered valid and interpretable.  SUMMARY OF TEST RESULTS: Premorbid verbal intellectual abilities were estimated to have been within the average range based on education and occupational history.  Performance on a test of vocabulary ability was also average. Current intellectual functioning placed in the 30th percentile, which is average for her age. Her Verbal Comprehension Index Score of 98 (45th percentile) is average compared to age matched peers. Perceptual Reasoning Index Score of 86 (18th percentile) is below average and slightly weaker (but not statistically significant) than her verbal abilities. Her ability to arrange blocks to match a visual design was average. Psychomotor processing speed was also below average (23rd percentile). Auditory attention and working memory were average (55th percentile).   Other language abilities including confrontation naming and semantic verbal fluency were average. Auditory comprehension of complex ideational material was average. With regard to verbal memory, encoding and acquisition of a list of word pairs was average. After a 20 minute delay, free recall was average. Performance on a yes/no recognition task was average. On another verbal memory test, encoding and acquisition of contextual auditory information (i.e., short stories) was above average. After a delay, free recall was also above average. Performance on a yes/no recognition task was average. With regard to non-verbal memory, delayed free recall of visual information was average. Executive functioning was mixed overall. Mental flexibility and set-shifting were below average on Trails B. Verbal fluency with phonemic search restrictions was below average. Verbal abstract reasoning was average. Non-verbal abstract reasoning was below average. Performance on a clock drawing task was average. Her ability to copy of complex visual spatial figure was  well below average. Spatial judgement was average. Motor coordination in non-dominant hand appears significantly weaker than dominant side. On a self-report measure of mood, the patient's responses were not indicative of clinically significant depression at the  present time. On self-report measures of anxiety, the patient did not endorse any clinically significant generalized anxiety at the present time.   CLINICAL IMPRESSIONS: Results of the current cognitive evaluation are generally within normal limits, with most areas of function consistent with estimated premorbid intellectual abilities. However, fine and gross motor functions are exceptionally low and non-dominant side appears more negatively impacted. This findings points to possible involvement of upper motor neurons projecting from the motor cortex to spinal cord. Consistent with this, muscle weakness is pronounced in both legs and arms. There are also a few areas of mild impairment suggestive of mild fronto-subcortical dysfunction (e.g., mildly impaired phonemic verbal fluency, mildy impaired copy of complex figure). Her testing results do not warrant a diagnosis of dementia; however, a diagnosis mild neurocognitive impairment is appropriate at this time.  While normal performance on memory testing does not necessarily rule out the possibility of some mild and/or transient problems, it does provide strong evidence against any very significant impairment at this time.   The current results possibly reflect several etiological factors working in combination, including numerous cerebrovascular risk factors (e.g., CHF, hypothyroidism, hypertension,  diabetes, sleep apnea), subacute combined degeneration (B12 deficiency), and potential motor neuron disease. While cervical cord compression can also cause upper motor neuron findings in the arms and legs, as well as lower motor neurons findings in the arms due to local nerve root compression, it is also possible for degenerative changes of the cervical/lumbar spine to coexist with motor neuron disease in older adults; radiological findings show cervical spondylosis and spinal stenosis of lumbar region with neurogenic claudication. Patient is currently receiving B12  injections and tolerating well. Recent labs were unremarkable and B12 has significantly improved. The patient has been diagnosed with obstructive sleep apnea but is diligent about wearing CPAP device and uses it every night.     The pattern of deficits and weaknesses observed during interview and testing, insidious onset, slow progression of symptoms (10+years) that include upper motor dysfunction and pattern of weakness (more pronounced in legs) are consistent features of primary lateral sclerosis (PLS). Further, patient does not present with fasciculations, muscle atrophy, or lower motor neuron signs on past clinical exams; normal EMG. Cognition typically remains unaffected in this population, however some frontal lobe dysfunction can be seen in 10-20% of patients. Two common differential considerations for PLS would be an upper motor neuron presentation of ALS, or the hereditary spastic paraplegia; family medical history is unclear but include motor neuron disease per patient report. Transcranial magnetic stimulation (TMS) may be considered to further assist with differential diagnosis via assessing the excitability of the motor cortex. Nuclear imaging techniques may also be useful in clarifying the clinical picture. Patient's cognitive functioning should be monitored through repeat neuropsychological assessment.    Functionally, the patient's motor and balance impairments can be anticipated to cause difficulties in daily functioning. Weakness in executive functioning suggest possibility of difficulty with planning, organizing, following through with daily life activities, and coming up with appropriate solutions to problem situations.  Increased irritability and problems with self-management are also common manifestations of executive dysfunction. Thankfully, she is not indicating clinically significant depression or anxiety at the present time.  Taking Effexor for fibromyalgia symptoms and reportedly  tolerating well.   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    RECOMMENDATIONS: 1. Follow-up with Cortez Givens, NP (Neurology) and Dr. Madilyn Fireman.  . Consider limiting or eliminating the use of Aricept as it may worsen irritability in individuals with her neuropsychological profile. Of course, there is a cost-benefit analysis that must be considered carefully and it may not be possible to treat all of the patient's symptoms without some cognitive side effects. . Considering the noted medical history, the patient is at increased risk for progression of cognitive impairment. Therefore, it is recommended that the patient aggressively manage any modifiable risk factors for further cognitive decline such as strict compliance with prescribed medical treatments for any cerebrovascular risk factors (e.g., thyroid disease, high blood pressure, sleep apnea, diabetes, CHF). . Non-pharmacological treatments for PLS:    - Exercise and stretching - Speech therapy  - Balance-related exercises - Occupational therapy  2. Patient might consider using assistive devices, such as a cane or walker to prevent falls and injury.  3. The patient is encouraged to attend to lifestyle factors for brain health (e.g., regular physical exercise, good nutrition habits, regular participation in cognitively-stimulating activities, and general stress management techniques), which are likely to have benefits for both emotional adjustment and cognition.  In fact, in addition to promoting  general good health, regular exercise incorporating aerobic activities has been demonstrated to be a very effective treatment for depression and stress, with similar efficacy rates to both antidepressant medication and psychotherapy. And for those with orthopedic issues, water aerobics may be  particularly beneficial.  4. Activities that have therapeutic value and might be useful to keep you cognitively stimulated; https://www.[facility].org/get-to-know-barrow/centers-programs/neurorehabilitation-center/neuro-rehab-apps-and-games/ 1. lists compiled by therapists at [facility],  2. categorized by level of difficulty 3. NOT a substitute for therapy.   5. Nutritional factors can have a significant effect on psychological and emotional status, as well as overall brain functioning.  The following general recommendations have been associated with improvements in depression and other psychological symptoms, as well as lower risk for dementia and other forms of cognitive impairment.  Please discuss these recommendations with your physician and/or dietitian before initiating:  . Consume a wide variety of fresh fruits and vegetables, particularly including brightly colored items such as berries, oranges, tomatoes, peppers, carrots, broccoli, spinach, dark green lettuces, sweet potatoes, etc., all of which are high in vitamins and antioxidants.  . Consume foods that are high in fiber, such as legumes (e.g., beans, peas, lentils) and foods made from whole grains (e.g., whole wheat bread and pasta)  . Consume a significant amount of omega-3 essential fats and oils.  These can be found in natural food sources such as salmon and other fatty fish, and also products made from flax seed and flax seed oil.  Alternately, dietary supplementation with fish oil capsules and flax seed oil capsules is a good way to boost one's level of omega-3 consumption.  It is important to check with your doctor before taking these supplements, especially if you take blood thinning medication.  . If you do not already do so, consider taking a quality multivitamin supplement under the guidance of your primary care physician.   . Consider keeping consumption of the following foods to a minimum: 1) foods  made from white flour and white sugar; 2) artificial sweeteners; 3) deep-fried foods; 4) animal fat other than fish; 5) any foods containing "hydrogenated" or "trans" fats; 6) most other types of highly processed packaged/prepared foods.    6. Neuropsychological re-evaluation is recommended in approximately one year (or sooner) to monitor treatment efficacy, to assist with the management of the patient (start or continue rehab or pharmacological therapy), to determine any clinical and functional significance of brain abnormality over time, as well as to document any potential improvement or decline in cognitive functioning. Lastly, any follow-up testing will help delineate the specific cognitive basis of any new functional complaints. If you wish to make this follow-up appointment, please contact our office at [phone].   7. Try to keep in mind that over-focusing on word finding difficulties or other language errors can contribute to further distraction and emotions that can detract from effective retrieval of words; this in turn, can lead to greater distress and more difficulties with recalling the specific word you were looking for to begin with.   The following are several strategies that may help:  . Performance will generally be best in a structured, routine, and familiar environment, as opposed to situations involving complex problems.  Marlene Lard a place to keep your keys, wallet, cell phone, and other personal belongings. . Take time to register and process information to be remembered. Deeper encoding of information can be gained by forming a mental picture, making meaningful associations, connecting new information to previously learned and related information, paraphrasing and  repetition.  . To the extent possible, multitasking should be avoided; break down tasks into smaller steps to help get started and to keep from feeling overwhelmed. And if there are difficulties in organization and  planning, maintaining a daily organizer to help keep track of important appointments and information may be beneficial.   . Memory problems may at least be minimally addressed using compensatory strategies such as the use of a daily schedule to follow, memos, portable recorder, a centrally located bulletin board, or memory notebook. A large calendar, placed in a highly visible location would be valuable to keep track of dates and appointments.  In addition, it would be helpful to keep a log of all of medical appointments with the name of the doctor, date of visit, diagnoses, and treatments.  . Use of a medication box is recommended to ensure compliance and decrease confusion regarding medication dosages, times, and dates. . To aid in managing problems with attention, the patient may consider using some of the following strategies: o The patient should simplify tasks.  There may be a need to break overly complex activities into simple step-by-step tasks, keep these steps written down in a note book and then check them off as they are completed which will help to stay on task and make sure the whole task is finished.  o The patient should set deadlines for everything, even for seemingly small tasks, prioritize time-sensitive tasks and write down every assignment, message, or important thought. o The patient is encouraged to use timers and alarms to stay on track and take breaks at regular intervals. Avoid piles of paperwork or procrastination by dealing with each item as it comes in.  Techniques for making living spaces safer:  . Make the stovetop and oven inaccessible by removing or covering the knobs . Secure all medications in a safe place . Unplug appliances when not in use . Reduce tripping hazards such as moving any cords out of the way . Could consider marking the edges of steps with colored tape . Install night lights throughout the house  Techniques of communicating with a person with  cognitive and/or memory impairment:  . Maintain a regular schedule for as many activities as possible. . Practice reality orientation. . Repeat information quietly and firmly. Marland Kitchen Keep the voice low and calm and be rational and understanding. . Talk in a warm and encouraging manner. . Talk gently and calmly; smile and be relaxed. . Use normal voice tone, do not be condescending. . Talk in a quiet place without distraction. . Show respect and acceptance. . Use one sentence for each idea and check to see if the patient understands before proceeding. . Do not interrupt, signal acceptance of message by nods, or smiles when appropriate, and repeat what the patient has said when the message is complete. Marland Kitchen Keep stress, arguing, disagreements, and verbal tirades to a minimum, as any additional stressor on the patient will cause continued and more rapid deterioration.  FEEDBACK TO PATIENT: Corryn Madewell Holzheimer will return for a feedback appointment on 12/17/20 to review the results of her neuropsychological evaluation with this provider.  Thank you for your referral of Zarina Pe Barton. If you have any questions, please contact us at [phone].   This report is intended solely for the confidential review and use by the referring professional to assist in diagnostic and medical decision making needs.  This report should not be released to a third party without proper consent. [NOTE: data can  be made available to qualified professionals with permission from the patient or legal representative/caregiver]    ____________________________________ Alfonso Ellis, PsyD,  Licensed Psychologist (Provisional) Clinical Neuropsychologist         Billing/Service Summary:   Neurobehavioral Status Exam:  Base: [id number] Add-on: [phone]  Direct clinical assessment (interview) of the patient and collateral interviews (as appropriate) by the licensed psychologist                                                                                       Total time: 120 minutes       Total units:  1 1  Neuropsychological Testing Evaluation Services:   Base: [id number] Add-on: [phone]  Records review & clarify referral question; Patient symptom management; clinical decision making/battery modification; Integration/report generation; and, post-service work   Total time:  120 minutes  Total units:  1 1  Designer, fashion/clothing by Psychologist:  Base: [id number] Add-on: [phone]  Test Administration (face-to-face) Scoring (Non-face-to-face)                                                                                       Total time: 240 minutes      Total units:  1 7

## 2020-12-17 NOTE — Progress Notes (Signed)
PA for Lansoprazole approved 11/16/20-12/16/21

## 2020-12-18 ENCOUNTER — Ambulatory Visit (INDEPENDENT_AMBULATORY_CARE_PROVIDER_SITE_OTHER): Payer: Medicare Other | Admitting: Family Medicine

## 2020-12-18 ENCOUNTER — Encounter: Primary: Internal Medicine

## 2020-12-18 VITALS — BP 120/58 | HR 64 | Temp 97.3°F | Wt 174.4 lb

## 2020-12-18 DIAGNOSIS — D510 Vitamin B12 deficiency anemia due to intrinsic factor deficiency: Secondary | ICD-10-CM | POA: Diagnosis not present

## 2020-12-18 DIAGNOSIS — E538 Deficiency of other specified B group vitamins: Secondary | ICD-10-CM

## 2020-12-18 MED ORDER — CYANOCOBALAMIN 1000 MCG/ML IJ SOLN
1000.0000 ug | Freq: Once | INTRAMUSCULAR | Status: AC
  Administered 2020-12-18: 1000 ug via INTRAMUSCULAR

## 2020-12-18 NOTE — Progress Notes (Signed)
Agree with documentation as above.   Imogene Gravelle, MD  

## 2020-12-18 NOTE — Progress Notes (Signed)
Pt is here for a vitamin B 12 injection. Denies GI problems or dizziness.  Pt tolerated injection well without complications. Pt advised to schedule next injection in 1 month.  Location: [facility]

## 2020-12-19 DIAGNOSIS — M47812 Spondylosis without myelopathy or radiculopathy, cervical region: Secondary | ICD-10-CM | POA: Diagnosis not present

## 2020-12-20 ENCOUNTER — Encounter: Payer: Self-pay | Admitting: Psychology

## 2020-12-20 NOTE — Progress Notes (Signed)
   Neuropsychology Feedback Appointment  Eagle, husband, and daughter returned for a feedback appointment today to review the results of her recent neuropsychological evaluation with this provider. 60 minutes face-to-face time was spent reviewing her test results, my impressions and my recommendations as detailed in her report. Education was provided about PLS. The patient, husband, and daughter were given the opportunity to ask questions, and I did my best to answer these to their satisfaction. 9 month follow-up for repeat assessment was scheduled.

## 2020-12-23 ENCOUNTER — Ambulatory Visit (INDEPENDENT_AMBULATORY_CARE_PROVIDER_SITE_OTHER): Payer: Medicare Other | Admitting: Internal Medicine

## 2020-12-23 ENCOUNTER — Encounter: Payer: Self-pay | Admitting: Internal Medicine

## 2020-12-23 ENCOUNTER — Other Ambulatory Visit: Payer: Self-pay | Admitting: *Deleted

## 2020-12-23 ENCOUNTER — Other Ambulatory Visit: Payer: Self-pay

## 2020-12-23 VITALS — BP 136/80 | HR 52 | Ht 66.0 in | Wt 169.0 lb

## 2020-12-23 DIAGNOSIS — I10 Essential (primary) hypertension: Secondary | ICD-10-CM

## 2020-12-23 DIAGNOSIS — M25462 Effusion, left knee: Secondary | ICD-10-CM | POA: Diagnosis not present

## 2020-12-23 DIAGNOSIS — I471 Supraventricular tachycardia: Secondary | ICD-10-CM | POA: Diagnosis not present

## 2020-12-23 DIAGNOSIS — S8992XA Unspecified injury of left lower leg, initial encounter: Secondary | ICD-10-CM | POA: Diagnosis not present

## 2020-12-23 DIAGNOSIS — M25562 Pain in left knee: Secondary | ICD-10-CM | POA: Diagnosis not present

## 2020-12-23 MED ORDER — LEFLUNOMIDE 20 MG PO TABS
20.0000 mg | ORAL_TABLET | Freq: Every day | ORAL | 2 refills | Status: DC
Start: 2020-12-23 — End: 2021-05-06

## 2020-12-23 NOTE — Progress Notes (Signed)
HPI Mrs. Mcmurtry returns today for followup.She is a pleasant 70 yo woman with HTN and SVT who underwent ablation several years ago. She saw Dr. Acie Fredrickson for eval several weeks ago. Her bp has been fairly labile. She has had minutes of palpitations.  Allergies  Allergen Reactions  . Codeine Nausea And Vomiting  . Myrbetriq  [Mirabegron] Other (See Comments)    SEVERE HEADACHE  . Percocet [Oxycodone-Acetaminophen] Nausea And Vomiting  . Propoxyphene Nausea And Vomiting  . Nystatin   . Celebrex [Celecoxib] Other (See Comments)    Other reaction(s): Other flushed  . Darvocet [Propoxyphene N-Acetaminophen] Nausea And Vomiting  . Erythromycin Nausea And Vomiting    Nausea and vomiting   . Hydrocodone Nausea And Vomiting  . Lyrica [Pregabalin] Swelling    Legs swelling   . Macrodantin [Nitrofurantoin] Nausea And Vomiting  . Toradol [Ketorolac Tromethamine] Nausea And Vomiting    Per patient, only PO form causes nausea and vomiting. Can take injection without issue  . Tramadol Nausea And Vomiting  . Verapamil Nausea And Vomiting    REACTION: intolerance     Current Outpatient Medications  Medication Sig Dispense Refill  . amLODipine (NORVASC) 5 MG tablet Take 1 tablet (5 mg total) by mouth daily. 90 tablet 3  . Cyanocobalamin (VITAMIN B-12 IJ) Inject 1 mL as directed every 30 (thirty) days.     Marland Kitchen denosumab (PROLIA) 60 MG/ML SOLN injection Inject 60 mg into the skin every 6 (six) months. Administer in upper arm, thigh, or abdomen    . donepezil (ARICEPT) 10 MG tablet TAKE 1 TABLET AT BEDTIME 90 tablet 3  . fluticasone (FLONASE) 50 MCG/ACT nasal spray Place 2 sprays into both nostrils daily. 16 g 6  . gabapentin (NEURONTIN) 300 MG capsule TAKE 1 TO 2 CAPSULES BY MOUTH UP TO TWO TIMES DAILY AS NEEDED 180 capsule 3  . ipratropium (ATROVENT) 0.03 % nasal spray Place 2 sprays into both nostrils every 12 (twelve) hours. 30 mL 1  . leflunomide (ARAVA) 20 MG tablet Take 1 tablet (20 mg  total) by mouth daily. 30 tablet 2  . losartan (COZAAR) 50 MG tablet Take 2 tablets (100 mg total) by mouth daily. Take 1 tablet in the morning and 1 tablet in evening as needed for blood pressure greater than 150 180 tablet 3  . Melatonin 10 MG CAPS Take 10 capsules by mouth daily.    . meloxicam (MOBIC) 15 MG tablet Take by mouth.    . memantine (NAMENDA) 10 MG tablet TAKE 1 TABLET TWICE DAILY 180 tablet 4  . metFORMIN (GLUCOPHAGE-XR) 500 MG 24 hr tablet TAKE 2 TABLETS EVERY DAY WITH BREAKFAST 180 tablet 1  . metoprolol tartrate (LOPRESSOR) 100 MG tablet TAKE 1 AND 1/2 TABLETS TWICE DAILY 270 tablet 3  . ondansetron (ZOFRAN-ODT) 8 MG disintegrating tablet Take 1 tablet by mouth as needed for nausea.    . pantoprazole (PROTONIX) 40 MG tablet Take 40 mg by mouth 2 (two) times daily.    . polyethylene glycol powder (GLYCOLAX/MIRALAX) 17 GM/SCOOP powder Take 17 g by mouth daily as needed for mild constipation or moderate constipation.     . primidone (MYSOLINE) 50 MG tablet TAKE 1 TABLET AT BEDTIME 90 tablet 1  . venlafaxine XR (EFFEXOR-XR) 75 MG 24 hr capsule TAKE 1 CAPSULE EVERY DAY WITH BREAKFAST 90 capsule 3   No current facility-administered medications for this visit.     Past Medical History:  Diagnosis Date  . Anal  fissure   . Anemia   . Arthritis   . Blood transfusion   . C. difficile colitis   . Chest pain    a. 01/2013 MV: EF 59%, no ischemia.  . Chronic Dyspnea    a. 01/2013 Echo: EF 60-65%, Gr 1 DD, PASP 39mmHg.// Echo 01/2020: EF 60-65, no RWMA, GR 1 DD, GLS -21.6%, normal RV SF, trivial MR   . COVID-19   . DDD (degenerative disc disease), cervical   . DDD (degenerative disc disease), lumbar   . Diabetes mellitus   . Fibromyalgia   . Gall stones   . GERD (gastroesophageal reflux disease)    gastritis  . Hemorrhoids   . Hypertension   . Interstitial cystitis   . MCI (mild cognitive impairment) 11/25/2017  . Memory difficulty 12/14/2013  . Neuromuscular disorder ([place])     sclerosis  . Osteoporosis   . Peptic ulcer   . PONV (postoperative nausea and vomiting)   . Pseudogout   . Raynaud phenomenon   . Rectal bleeding   . Sleep apnea    a. on cpap.  Marland Kitchen SVT (supraventricular tachycardia) ([place])   . Syncope 09/10/2015  . Thyroid disease    hypothyroidism  . Tremor, essential 05/14/2016    ROS:   All systems reviewed and negative except as noted in the HPI.   Past Surgical History:  Procedure Laterality Date  . APPENDECTOMY    . BLADDER SURGERY     x2  . BLADDER SURGERY    . BREAST BIOPSY    . CARDIAC CATHETERIZATION    . CARPAL TUNNEL RELEASE    . CATARACT EXTRACTION Bilateral   . CHOLECYSTECTOMY    . DILATION AND CURETTAGE OF UTERUS    . ENTEROCELE REPAIR     x2  . EYE SURGERY     retina  . hysterectomy - unknown type    . JOINT REPLACEMENT    . KNEE ARTHROPLASTY    . KNEE SURGERY     x6  . NECK SURGERY     fusion  . OOPHORECTOMY    . QUADRICEPS REPAIR Right   . RECTOCELE REPAIR     x2  . SHOULDER ARTHROSCOPY WITH SUBACROMIAL DECOMPRESSION, ROTATOR CUFF REPAIR AND BICEP TENDON REPAIR  10/06/2012   Procedure: SHOULDER ARTHROSCOPY WITH SUBACROMIAL DECOMPRESSION, ROTATOR CUFF REPAIR AND BICEP TENDON REPAIR;  Surgeon: Nita Sells, MD;  Location: [facility];  Service: Orthopedics;  Laterality: Right;  Arthroscopic  Repair  of  Subscapularis, Open Biceps Tenodesis  . SHOULDER SURGERY     bilateral- bones spur  . TONSILLECTOMY    . TOTAL KNEE ARTHROPLASTY    . TOTAL SHOULDER ARTHROPLASTY       Family History  Problem Relation Age of Onset  . Heart attack Mother   . Stroke Mother   . Diabetes Mother   . Heart disease Mother   . Colon polyps Mother   . Asthma Mother   . Breast cancer Maternal Grandmother   . Wilson's disease Maternal Grandmother   . Pancreatic cancer Maternal Grandfather   . Heart disease Father   . Heart attack Father   . Asthma Father   . Stroke Sister   . Hypertension Sister    . Rheum arthritis Sister   . Dementia Sister   . Asthma Sister   . Lupus Sister   . Asthma Sister      Social History   Socioeconomic History  . Marital status: Married  Spouse name: Not on file  . Number of children: 2  . Years of education: 3  . Highest education level: Not on file  Occupational History  . Occupation: Retired  Tobacco Use  . Smoking status: Never Smoker  . Smokeless tobacco: Never Used  Vaping Use  . Vaping Use: Never used  Substance and Sexual Activity  . Alcohol use: No    Alcohol/week: 0.0 standard drinks  . Drug use: No  . Sexual activity: Not on file  Other Topics Concern  . Not on file  Social History Narrative   Patient drinks about 3-4 cups of caffeine daily.   Patient is right handed.   Social Determinants of Health   Financial Resource Strain: Not on file  Food Insecurity: Not on file  Transportation Needs: Not on file  Physical Activity: Not on file  Stress: Not on file  Social Connections: Not on file  Intimate Partner Violence: Not on file     Pulse (!) 52   Ht 5\' 6"  (1.676 m)   Wt 169 lb (76.7 kg)   SpO2 97%   BMI 27.28 kg/m   Physical Exam:  Well appearing NAD HEENT: Unremarkable Neck:  No JVD, no thyromegally Lymphatics:  No adenopathy Back:  No CVA tenderness Lungs:  Clear with no wheezes HEART:  Regular rate rhythm, no murmurs, no rubs, no clicks Abd:  soft, positive bowel sounds, no organomegally, no rebound, no guarding Ext:  2 plus pulses, no edema, no cyanosis, no clubbing Skin:  No rashes no nodules Neuro:  CN II through XII intact, motor grossly intact  EKG - sinus brady  Assess/Plan: 1. Palpitations - she has not had any documented sustained SVT since her ablation. She will continue her beta blocker. 2. HTN - this is her biggest problem. She is on losartan and metoprolol and norvasc. I strongly encouraged her to avoid salty food.  Carleene Overlie Taylor,MD

## 2020-12-23 NOTE — Telephone Encounter (Signed)
RX faxed from [facility] Visit: 08/09/2020 Next Visit: 01/10/2021 Labs: 12/09/2020, CBC and CMP are unremarkable. Sodium is mildly decreased but not significant.  Current Dose per office note 08/09/2020, Arava 20 mg 1 tablet by mouth daily DX: Seronegative rheumatoid arthritis  Last Fill: 10/07/2020  Okay to refill Leflunomide?

## 2020-12-23 NOTE — Patient Instructions (Signed)
Medication Instructions:  Your physician recommends that you continue on your current medications as directed. Please refer to the Current Medication list given to you today.  Labwork: None ordered.  Testing/Procedures: None ordered.  Follow-Up: Your physician wants you to follow-up in: one year with Gregg Taylor, MD or one of the following Advanced Practice Providers on your designated Care Team:    Amber Seiler, NP  Renee Ursuy, PA-C  Michael "Andy" Tillery, PA-C   Any Other Special Instructions Will Be Listed Below (If Applicable).  If you need a refill on your cardiac medications before your next appointment, please call your pharmacy.       

## 2020-12-25 ENCOUNTER — Inpatient Hospital Stay: Admit: 2020-12-25 | Payer: MEDICARE | Primary: Internal Medicine

## 2020-12-25 ENCOUNTER — Encounter: Attending: Internal Medicine | Primary: Internal Medicine

## 2020-12-25 ENCOUNTER — Ambulatory Visit: Admit: 2020-12-25 | Discharge: 2020-12-25 | Payer: MEDICARE | Primary: Internal Medicine

## 2020-12-25 DIAGNOSIS — E119 Type 2 diabetes mellitus without complications: Secondary | ICD-10-CM

## 2020-12-25 LAB — LIPID PANEL
CHOL/HDL Ratio: 2.7 (ref 0–5.0)
Chol/HDL Ratio: 2.7 (ref 0–5.0)
Cholesterol, Total: 129 MG/DL (ref <200)
Cholesterol, total: 129 MG/DL (ref <200)
HDL Cholesterol: 48 MG/DL (ref 40–60)
HDL: 48 MG/DL (ref 40–60)
LDL Calculated: 53.6 MG/DL (ref 0–100)
LDL, calculated: 53.6 MG/DL (ref 0–100)
Triglyceride: 137 MG/DL (ref <150)
Triglycerides: 137 MG/DL (ref <150)
VLDL Cholesterol Calculated: 27.4 MG/DL
VLDL, calculated: 27.4 MG/DL

## 2020-12-25 LAB — METABOLIC PANEL, COMPREHENSIVE
A-G Ratio: 1 (ref 0.8–1.7)
ALT (SGPT): 22 U/L (ref 13–56)
AST (SGOT): 18 U/L (ref 10–38)
Albumin: 3.4 g/dL (ref 3.4–5.0)
Alk. phosphatase: 78 U/L (ref 45–117)
Anion gap: 4 mmol/L (ref 3.0–18)
BUN/Creatinine ratio: 13 (ref 12–20)
BUN: 9 MG/DL (ref 7.0–18)
Bilirubin, total: 0.4 MG/DL (ref 0.2–1.0)
CO2: 30 mmol/L (ref 21–32)
Calcium: 9.3 MG/DL (ref 8.5–10.1)
Chloride: 105 mmol/L (ref 100–111)
Creatinine: 0.68 MG/DL (ref 0.6–1.3)
GFR est AA: >60 mL/min/{1.73_m2} (ref >60)
GFR est non-AA: >60 mL/min/{1.73_m2} (ref >60)
Globulin: 3.5 g/dL (ref 2.0–4.0)
Glucose: 100 mg/dL — ABNORMAL HIGH (ref 74–99)
Potassium: 4.1 mmol/L (ref 3.5–5.5)
Protein, total: 6.9 g/dL (ref 6.4–8.2)
Sodium: 139 mmol/L (ref 136–145)

## 2020-12-25 LAB — HEMOGLOBIN A1C WITH EAG
Est. average glucose: 117 mg/dL
Hemoglobin A1c: 5.7 % — ABNORMAL HIGH (ref 4.2–5.6)

## 2020-12-25 LAB — HEMOGLOBIN A1C W/EAG
Hemoglobin A1C: 5.7 % — ABNORMAL HIGH (ref 4.2–5.6)
eAG: 117 mg/dL

## 2020-12-25 LAB — COMPREHENSIVE METABOLIC PANEL
ALT: 22 U/L (ref 13–56)
AST: 18 U/L (ref 10–38)
Albumin/Globulin Ratio: 1 (ref 0.8–1.7)
Albumin: 3.4 g/dL (ref 3.4–5.0)
Alkaline Phosphatase: 78 U/L (ref 45–117)
Anion Gap: 4 mmol/L (ref 3.0–18)
BUN: 9 MG/DL (ref 7.0–18)
Bun/Cre Ratio: 13 (ref 12–20)
CO2: 30 mmol/L (ref 21–32)
Calcium: 9.3 MG/DL (ref 8.5–10.1)
Chloride: 105 mmol/L (ref 100–111)
Creatinine: 0.68 MG/DL (ref 0.6–1.3)
EGFR IF NonAfrican American: >60 mL/min/{1.73_m2} (ref >60)
GFR African American: >60 mL/min/{1.73_m2} (ref >60)
Globulin: 3.5 g/dL (ref 2.0–4.0)
Glucose: 100 mg/dL — ABNORMAL HIGH (ref 74–99)
Potassium: 4.1 mmol/L (ref 3.5–5.5)
Sodium: 139 mmol/L (ref 136–145)
Total Bilirubin: 0.4 MG/DL (ref 0.2–1.0)
Total Protein: 6.9 g/dL (ref 6.4–8.2)

## 2020-12-27 DIAGNOSIS — M25562 Pain in left knee: Secondary | ICD-10-CM | POA: Diagnosis not present

## 2020-12-27 DIAGNOSIS — M1712 Unilateral primary osteoarthritis, left knee: Secondary | ICD-10-CM | POA: Diagnosis not present

## 2020-12-27 NOTE — Progress Notes (Deleted)
Office Visit Note  Patient: Mia [name]             Date of Birth: Jun 30, 1951           MRN: [id number]             PCP: Hali Marry, MD Referring: Hali Marry, * Visit Date: 01/10/2021 Occupation: @GUAROCC @  Subjective:  No chief complaint on file.   History of Present Illness: Mia [name] is a 70 y.o. female ***   Activities of Daily Living:  Patient reports morning stiffness for *** {minute/hour:19697}.   Patient {ACTIONS;DENIES/REPORTS:21021675::"Denies"} nocturnal pain.  Difficulty dressing/grooming: {ACTIONS;DENIES/REPORTS:21021675::"Denies"} Difficulty climbing stairs: {ACTIONS;DENIES/REPORTS:21021675::"Denies"} Difficulty getting out of chair: {ACTIONS;DENIES/REPORTS:21021675::"Denies"} Difficulty using hands for taps, buttons, cutlery, and/or writing: {ACTIONS;DENIES/REPORTS:21021675::"Denies"}  No Rheumatology ROS completed.   PMFS History:  Patient Active Problem List   Diagnosis Date Noted  . Pernicious anemia 09/11/2020  . Cyst of skin and subcutaneous tissue 09/11/2020  . 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 Weakness of both lower extremities 06/11/2020  . Hyponatremia 05/22/2020  . Gait instability 06/20/2019  . Facet arthritis of lumbar region 05/10/2018  . MCI (mild cognitive impairment) 11/25/2017  . Restrictive lung disease 07/30/2017  . Mixed hyperlipidemia 06/18/2017  . Rectocele 04/26/2017  . Irritable bowel syndrome with both constipation and diarrhea 04/26/2017  . Osteoporosis 04/18/2017  . Trochanteric bursitis of both hips 02/24/2017  . Stress fracture of left tibia 11/05/2016  . Inflammatory arthritis 09/30/2016  . High risk medication use 09/30/2016  . History of Clostridium difficile colitis 09/30/2016  . 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 Pseudogout 09/30/2016  . DDD cervical spine status post fusion 09/30/2016  . DDD thoracic spine 09/30/2016  . H/O total knee replacement, right 09/30/2016  .  Age-related osteoporosis without current pathological fracture 09/30/2016  . Tremor, essential 05/14/2016  . Right foot pain 04/14/2016  . B12 deficiency 09/10/2015  . Syncope 09/10/2015  . Cervical facet joint syndrome 09/10/2015  . Chronic fatigue 09/10/2015  . Aortic atherosclerosis ([place]) 04/01/2015  . DOE (dyspnea on exertion)   . Primary osteoarthritis of right knee 08/13/2014  . Benign head tremor 06/11/2014  . Lumbar degenerative disc disease 06/11/2014  . IFG (impaired fasting glucose) 03/09/2014  . Hemorrhoid 03/09/2014  . Retinal wrinkling, right eye 03/09/2014  . Fibromyalgia 03/09/2014  . GERD (gastroesophageal reflux disease) 03/09/2014  . History of arthroplasty of right knee 01/31/2014  . Memory difficulty 12/14/2013  . Hemorrhoids, external, thrombosed 06/22/2011  . Hypertension 03/11/2011  . SVT (supraventricular tachycardia) ([place])   . Raynaud phenomenon   . EDEMA 08/25/2010  . Nonspecific (abnormal) findings on radiological and other examination of body structure 08/25/2010  . COMPUTERIZED TOMOGRAPHY, CHEST, ABNORMAL 08/25/2010  . OSA (obstructive sleep apnea) 04/24/2009  . Allergic rhinitis 06/11/2008  . Dyspnea 06/11/2008  . 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 Chronic diastolic CHF (congestive heart failure) ([place]) 06/08/2008  . INTERSTITIAL CYSTITIS 06/08/2008    Past Medical History:  Diagnosis Date  . Anal fissure   . Anemia   . Arthritis   . Blood transfusion   . C. difficile colitis   . Chest pain    a. 01/2013 MV: EF 59%, no ischemia.  . Chronic Dyspnea    a. 01/2013 Echo: EF 60-65%, Gr 1 DD, PASP 1mmHg.// Echo 01/2020: EF 60-65, no RWMA, GR 1 DD, GLS -21.6%, normal RV SF, trivial MR   . COVID-19   . DDD (degenerative disc disease), cervical   . DDD (  degenerative disc disease), lumbar   . Diabetes mellitus   . Fibromyalgia   . Gall stones   . GERD (gastroesophageal reflux disease)    gastritis  . Hemorrhoids   .  Hypertension   . Interstitial cystitis   . MCI (mild cognitive impairment) 11/25/2017  . Memory difficulty 12/14/2013  . Neuromuscular disorder ([place])    sclerosis  . Osteoporosis   . Peptic ulcer   . PONV (postoperative nausea and vomiting)   . Pseudogout   . Raynaud phenomenon   . Rectal bleeding   . Sleep apnea    a. on cpap.  Marland Kitchen SVT (supraventricular tachycardia) ([place])   . Syncope 09/10/2015  . Thyroid disease    hypothyroidism  . Tremor, essential 05/14/2016    Family History  Problem Relation Age of Onset  . Heart attack Mother   . Stroke Mother   . Diabetes Mother   . Heart disease Mother   . Colon polyps Mother   . Asthma Mother   . Breast cancer Maternal Grandmother   . Wilson's disease Maternal Grandmother   . Pancreatic cancer Maternal Grandfather   . Heart disease Father   . Heart attack Father   . Asthma Father   . Stroke Sister   . Hypertension Sister   . Rheum arthritis Sister   . Dementia Sister   . Asthma Sister   . Lupus Sister   . Asthma Sister    Past Surgical History:  Procedure Laterality Date  . APPENDECTOMY    . BLADDER SURGERY     x2  . BLADDER SURGERY    . BREAST BIOPSY    . CARDIAC CATHETERIZATION    . CARPAL TUNNEL RELEASE    . CATARACT EXTRACTION Bilateral   . CHOLECYSTECTOMY    . DILATION AND CURETTAGE OF UTERUS    . ENTEROCELE REPAIR     x2  . EYE SURGERY     retina  . hysterectomy - unknown type    . JOINT REPLACEMENT    . KNEE ARTHROPLASTY    . KNEE SURGERY     x6  . NECK SURGERY     fusion  . OOPHORECTOMY    . QUADRICEPS REPAIR Right   . RECTOCELE REPAIR     x2  . SHOULDER ARTHROSCOPY WITH SUBACROMIAL DECOMPRESSION, ROTATOR CUFF REPAIR AND BICEP TENDON REPAIR  10/06/2012   Procedure: SHOULDER ARTHROSCOPY WITH SUBACROMIAL DECOMPRESSION, ROTATOR CUFF REPAIR AND BICEP TENDON REPAIR;  Surgeon: Nita Sells, MD;  Location: [facility];  Service: Orthopedics;  Laterality: Right;  Arthroscopic  Repair   of  Subscapularis, Open Biceps Tenodesis  . SHOULDER SURGERY     bilateral- bones spur  . TONSILLECTOMY    . TOTAL KNEE ARTHROPLASTY    . TOTAL SHOULDER ARTHROPLASTY     Social History   Social History Narrative   Patient drinks about 3-4 cups of caffeine daily.   Patient is right handed.   Immunization History  Administered Date(s) Administered  . Fluad Quad(high Dose 65+) 07/13/2019, 07/10/2020  . Influenza Split 07/26/2012  . Influenza Whole 07/30/2009, 08/19/2010  . Influenza, High Dose Seasonal PF 07/06/2016, 07/05/2017  . Influenza,inj,Quad PF,6+ Mos 06/11/2014, 07/25/2015, 06/23/2018  . Influenza-Unspecified 09/05/2013  . Moderna Sars-Covid-2 Vaccination 02/29/2020, 03/28/2020  . Pneumococcal Conjugate-13 01/04/2017  . Pneumococcal Polysaccharide-23 07/30/2009, 11/14/2018  . Tdap 02/13/2008, 05/12/2018  . Zoster 09/01/2011     Objective: Vital Signs: There were no vitals taken for this visit.   Physical  Exam   Musculoskeletal Exam: ***  CDAI Exam: CDAI Score: -- Patient Global: --; Provider Global: -- Swollen: --; Tender: -- Joint Exam 01/10/2021   No joint exam has been documented for this visit   There is currently no information documented on the homunculus. Go to the Rheumatology activity and complete the homunculus joint exam.  Investigation: No additional findings.  Imaging: No results found.  Recent Labs: Lab Results  Component Value Date   WBC 7.7 12/09/2020   HGB 12.0 12/09/2020   PLT 394 12/09/2020   NA 134 (L) 12/09/2020   K 4.7 12/09/2020   CL 99 12/09/2020   CO2 28 12/09/2020   GLUCOSE 86 12/09/2020   BUN 12 12/09/2020   CREATININE 0.94 12/09/2020   BILITOT 0.3 12/09/2020   ALKPHOS 73 05/21/2020   AST 23 12/09/2020   ALT 21 12/09/2020   PROT 6.5 12/09/2020   ALBUMIN 4.0 05/21/2020   CALCIUM 9.6 12/09/2020   GFRAA 72 12/09/2020    Speciality Comments: No specialty comments available.  Procedures:  No procedures  performed Allergies: Codeine, Myrbetriq  [mirabegron], Percocet [oxycodone-acetaminophen], Propoxyphene, Nystatin, Celebrex [celecoxib], Darvocet [propoxyphene n-acetaminophen], Erythromycin, Hydrocodone, Lyrica [pregabalin], Macrodantin [nitrofurantoin], Toradol [ketorolac tromethamine], Tramadol, and Verapamil   Assessment / Plan:     Visit Diagnoses: No diagnosis found.  Orders: No orders of the defined types were placed in this encounter.  No orders of the defined types were placed in this encounter.   Face-to-face time spent with patient was *** minutes. Greater than 50% of time was spent in counseling and coordination of care.  Follow-Up Instructions: No follow-ups on file.   Earnestine Mealing, CMA  Note - This record has been created using Editor, commissioning.  Chart creation errors have been sought, but may not always  have been located. Such creation errors do not reflect on  the standard of medical care.

## 2020-12-30 ENCOUNTER — Encounter

## 2020-12-30 MED ORDER — VITAMIN D2 1,250 MCG (50,000 UNIT) CAPSULE
1250 mcg (50,000 unit) | ORAL_CAPSULE | ORAL | 1 refills | Status: AC
Start: 2020-12-30 — End: ?

## 2020-12-31 ENCOUNTER — Other Ambulatory Visit: Payer: Self-pay | Admitting: Sports Medicine

## 2020-12-31 DIAGNOSIS — M47812 Spondylosis without myelopathy or radiculopathy, cervical region: Secondary | ICD-10-CM | POA: Diagnosis not present

## 2021-01-01 ENCOUNTER — Encounter: Payer: MEDICARE | Attending: Internal Medicine | Primary: Internal Medicine

## 2021-01-01 NOTE — Telephone Encounter (Signed)
To PCP

## 2021-01-06 ENCOUNTER — Encounter: Payer: MEDICARE | Attending: Internal Medicine | Primary: Internal Medicine

## 2021-01-06 ENCOUNTER — Ambulatory Visit: Admit: 2021-01-06 | Discharge: 2021-01-06 | Payer: MEDICARE | Attending: Internal Medicine | Primary: Internal Medicine

## 2021-01-06 ENCOUNTER — Ambulatory Visit: Attending: Internal Medicine | Primary: Internal Medicine

## 2021-01-06 DIAGNOSIS — Z0000 Encounter for general adult medical examination without abnormal findings: Secondary | ICD-10-CM

## 2021-01-06 DIAGNOSIS — Z7189 Other specified counseling: Secondary | ICD-10-CM

## 2021-01-06 NOTE — ACP (Advance Care Planning) (Signed)
Advance Care Planning     Advance Care Planning (ACP) Physician/NP/PA Conversation      Date of Conversation: 01/06/2021  Conducted with: Patient with Decision Making Capacity    Healthcare Decision Maker:     Primary Decision Maker: [name] - Spouse - [phone]  Click here to complete [place] including selection of the Healthcare Decision Maker Relationship (ie "Primary")        Today we documented Decision Maker(s) consistent with Legal Next of Kin hierarchy.    Care Preferences:    Hospitalization: "If your health worsens and it becomes clear that your chance of recovery is unlikely, what would be your preference regarding hospitalization?"  The patient would prefer hospitalization.    Ventilation: "If you were unable to breathe on your own and your chance of recovery was unlikely, what would be your preference about the use of a ventilator (breathing machine) if it was available to you?"   The patient would desire the use of a ventilator.    Resuscitation: "In the event your heart stopped as a result of an underlying serious health condition, would you want attempts to be made to restart your heart, or would you prefer a natural death?"   Yes, attempt to resuscitate.    Additional topics discussed: ventilation preferences, hospitalization preferences and resuscitation preferences    Conversation Outcomes / Follow-Up Plan:   ACP in process - information provided, considering goals and options  Reviewed DNR/DNI and patient elects Full Code (Attempt Resuscitation)     Length of Voluntary ACP Conversation in minutes:  16 minutes    Kiona Blume Jene Every, MD

## 2021-01-06 NOTE — Progress Notes (Signed)
Progress Notes by Albin Felling, MD at 01/06/21 1020                Author: Albin Felling, MD  Service: --  Author Type: Physician       Filed: 01/06/21 1123  Encounter Date: 01/06/2021  Status: Signed          Editor: Albin Felling, MD (Physician)               This is a subsequent medicare annual welness exam      I have reviewed the patient's medical history in detail and updated the computerized patient record.            Assessment/Plan     Education and counseling provided:   Are appropriate based on today's review and evaluation   End-of-Life planning (with patient's consent)   Influenza Vaccine   Screening Mammography   Colorectal cancer screening tests   Cardiovascular screening blood test   Bone mass measurement (DEXA)      1. Medicare annual wellness visit, subsequent   2. Controlled type 2 diabetes mellitus without complication, without long-term current use of insulin ([place])   3. Neuropathy due to chemotherapeutic drug ([place])   4. Malignant neoplasm of lower-outer quadrant of left breast of female, estrogen receptor negative ([place])   5. Dyslipidemia   6. Advanced directives, counseling/discussion   -     ADVANCE CARE PLANNING FIRST 30 MINS   7. Postmenopausal state   -     DEXA BONE DENSITY STUDY AXIAL; Future            Depression Risk Factor Screening           3 most recent PHQ Screens  01/06/2021        Little interest or pleasure in doing things  Not at all     Feeling down, depressed, irritable, or hopeless  Not at all        Total Score PHQ 2  0             Alcohol &  Drug Abuse Risk Screen      Do you average more than 1 drink per night or more than 7 drinks a week:  No      On any one occasion in the past three months have you have had more than 3 drinks containing alcohol:  No               Functional Ability and Level of Safety      Hearing : Hearing is good.        Activities of Daily Living:   The home contains: no safety equipment.   Patient does total self care         Ambulation: with no difficulty       Fall Risk:      Fall Risk Assessment, last 12 mths  01/06/2021        Able to walk?  Yes     Fall in past 12 months?  0     Do you feel unsteady?  0        Are you worried about falling  0         Abuse Screen:   Patient is not abused            Cognitive Screening      Has your family/caregiver stated any concerns about your memory:  no  Cognitive Screening: Normal - Mini Cog Test        Health Maintenance Due          Health Maintenance Due        Topic  Date Due         ?  COVID-19 Vaccine (3 - Booster for Moderna series)  05/17/2020     ?  Flu Vaccine (1)  06/26/2020         ?  MICROALBUMIN Q1   12/04/2020             Patient Care Team     Patient Care Team:   Albin Felling, MD as PCP - General (Internal Medicine)   Albin Felling, MD as PCP - [facility] Empaneled Provider        History          Patient Active Problem List        Diagnosis  Code         ?  Vitamin D deficiency  E55.9     ?  Anxiety  F41.9     ?  Dyslipidemia  E78.5     ?  Arthritis, degenerative  M19.90     ?  Overweight with body mass index (BMI) of 25 to 25.9 in adult  E66.3, Z68.25     ?  Controlled type 2 diabetes mellitus, without long-term current use of insulin (HCC)  E11.9     ?  Breast cancer of lower-outer quadrant of left female breast ([place])  C50.512         ?  Neuropathy due to chemotherapeutic drug ([facility])  G62.0, T45.1X5A          Past Medical History:        Diagnosis  Date         ?  Allergic rhinitis       ?  Anxiety       ?  Breast cancer ([place])  01/2020          Dr Jennye Boroughs; LEFT T2N0M0 invasive adenoca  ER/PR/Her2 neg Dr Meryl Crutch neoadjuvant AC-Taxol         ?  Compression fx, thoracic spine ([place])  2007          negative DEXA Dr. Marisa Hua         ?  Diabetes ([place])  10/2017          on basis of fbs>125; intol metformin         ?  Dyslipidemia       ?  Dyspepsia       ?  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  Frozen shoulder            right Dr. Wynonia Hazard MRI         ?  H/O cardiovascular stress test            thallium (2005) neg; NST neg ef 75% (1/17)         ?  H/O echocardiogram            EF 70% (12/04);  ef 60%, mild MR (7/21) [facility]         ?  Left thyroid nodule  04/2016  1cm nodule; apparently noted on CT 2008 and unchanged         ?  Multiple lung nodules            no change 10/06, 03/07, 03/08         ?  Neuropathy due to chemotherapeutic drug ([place])  06/20/2020     ?  Osteoarthritis            Dr. Synetta Shadow, Dr. Marisa Hua         ?  Overweight (BMI 25.0-29.9)            IF 7/18 start weight 168 lbs not doing          ?  Painless hematuria  04/2016          Dr Jimmye Norman, neg eval         ?  Palpitations  2008          neg thallium 2008, nl holter 2008, echo nl lv/ef 65%/tr mr/dd/nl pasp         ?  Syncope            neurocardiogenic by tilt 1994         ?  Venous insufficiency           ?  Vitamin D deficiency             Past Surgical History:         Procedure  Laterality  Date          ?  HX BREAST BIOPSY  Left  2021          Dr Jennye Boroughs          ?  HX COLONOSCOPY              Dr Rosendo Gros 2007 neg; 04/22/18 neg          ?  HX HEMORRHOIDECTOMY              Dr. Rosendo Gros 2007          ?  HX HYSTERECTOMY    03/11/2017          Dr Sheral Flow          ?  HX ORTHOPAEDIC              DEXA -0.5 spine, -0.8 hip (1/18)          ?  HX TUBAL LIGATION         ?  HX UROLOGICAL    06/2016          Dr Jimmye Norman; bladder bx showed benign lesions          ?  [place] ABD COMP    9/05          negative          ?  VAS CAROTID DUPLEX BILATERAL    2004          negative          Current Outpatient Medications          Medication  Sig  Dispense  Refill           ?  gabapentin (NEURONTIN) 300 mg capsule  Take 300 mg by mouth daily.         ?  Vitamin D2 1,250 mcg (50,000 unit) capsule  take 1 capsule by mouth EVERY MONTH  12 Capsule  1     ?  atorvastatin (LIPITOR) 40 mg  tablet  take 1 tablet by mouth once daily  90 Tablet  3     ?  lansoprazole (PREVACID) 30 mg capsule  take 1 capsule by mouth BEFORE BREAKFAST DAILY  90 Cap  3     ?  Januvia 100 mg tablet  take 1 tablet by mouth once daily  90 Tab  3     ?  multivitamin (ONE A DAY) tablet  Take 1 Tab by mouth daily.         ?  citalopram (CELEXA) 10 mg tablet  take 1 tablet by mouth once daily  90 Tab  3     ?  Blood-Glucose Meter monitoring kit  Use daily as directed  1 Kit  0     ?  glucose blood VI test strips (BLOOD GLUCOSE TEST) strip  Use daily as directed  100 Strip  11     ?  lancets misc  Use daily as directed  100 Each  11           ?  aspirin 81 mg chewable tablet  Take 81 mg by mouth daily.              Allergies        Allergen  Reactions         ?  Fish Oil  Nausea Only             Patient denies         ?  Metformin  Diarrhea         ?  Relafen [Nabumetone]  Nausea Only             Family History         Problem  Relation  Age of Onset          ?  Diabetes  Mother       ?  Elevated Lipids  Father       ?  Stroke  Paternal Grandmother            ?  Heart Disease  Paternal Grandfather            Social History          Tobacco Use         ?  Smoking status:  Never Smoker     ?  Smokeless tobacco:  Never Used       Substance Use Topics         ?  Alcohol use:  Yes              Alcohol/week:  4.0 standard drinks              Types:  4 Glasses of wine per week              [place] Jene Every, MD

## 2021-01-06 NOTE — Progress Notes (Addendum)
Progress  Notes by Lacie Scotts at 01/06/21 1020                Author: Lacie Scotts  Service: --  Author Type: Licensed Nurse       Filed: 01/06/21 1123  Encounter Date: 01/06/2021  Status: Signed          Editor: Albin Felling, MD (Physician)               error

## 2021-01-06 NOTE — Progress Notes (Signed)
 Progress  Notes by Butler Niemann at 01/06/21 1020                Author: Butler Niemann  Service: --  Author Type: Medical Assistant       Filed: 01/06/21 1123  Encounter Date: 01/06/2021  Status: Signed          Editor: Butler Niemann (Medical Assistant)               Laura Roman presents today for      Chief Complaint       Patient presents with        ?  Annual Wellness Visit     ?  Cholesterol Problem     ?  Diabetes     ?  Labs             12-25-20           1. Have you been to the ER, urgent care clinic since your last visit?  Hospitalized since your last visit? no   n   2. Have you seen or consulted any other health care providers outside of the [facility] since your last visit? no       3. For patients aged 63-75: Has the patient had a colonoscopy / FIT/ Cologuard? Yes  - no Care Gap present         If the patient is female:      4. For patients aged 22-74: Has the patient had a mammogram within the past 2 years?  NA - based on age or sex   See top three      5. For patients aged 21-65: Has the patient had a pap smear? NA - based on age or  sex

## 2021-01-06 NOTE — Progress Notes (Signed)
Progress Notes by Albin Felling, MD at 01/06/21 1020                Author: Albin Felling, MD  Service: --  Author Type: Physician       Filed: 01/06/21 1123  Encounter Date: 01/06/2021  Status: Signed          Editor: Albin Felling, MD (Physician)               70 y.o. white female  who presents for f/u      She has finished up the chemoradiation, now seeing Dr Mariea Clonts.  The neuropathy is doing okay, taking the gaba at night only      Denied any cardiovascular complaints. Trying to be active but no set exercise      No gi or gu issues       Denies polyuria, polydipsia, nocturia, vision change.  Lost about 20 lbs with the treatment, she's regained about 5-7 lbs back      No sx referable to the thyroid      LAST MEDICARE WELLNESS EXAM: 11/10/16, 11/23/17, 11/29/18, 12/14/19, 01/06/21        Past Medical History:        Diagnosis  Date         ?  Allergic rhinitis       ?  Anxiety       ?  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  Compression fx, thoracic spine ([place])  2007          negative DEXA Dr. Marisa Hua         ?  Diabetes ([place])  10/2017          on basis of fbs>125; intol metformin         ?  Dyslipidemia       ?  Dyspepsia       ?  Endometrial cancer ([place])  02/2017          Dr Sheral Flow; stage 1B gr 1 endometroid adenoca w foci squamous diff of the endometrium; RA TLH BSO PLND, 0/13LN, MSI intact         ?  Frozen shoulder            right Dr. Wynonia Hazard MRI         ?  H/O cardiovascular stress test            thallium (2005) neg; NST neg ef 75% (1/17)         ?  H/O echocardiogram            EF 70% (12/04);  ef 60%, mild MR (7/21) [facility]         ?  Left thyroid nodule  04/2016          1cm nodule; apparently noted on CT 2008 and unchanged         ?  Multiple lung nodules            no change 10/06, 03/07, 03/08         ?  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   Osteoarthritis            Dr. Synetta Shadow, Dr. Marisa Hua         ?  Overweight (BMI 25.0-29.9)            IF 7/18 start weight 168 lbs not doing          ?  Painless hematuria  04/2016          Dr Jimmye Norman, neg eval         ?  Palpitations  2008          neg thallium 2008, nl holter 2008, echo nl lv/ef 65%/tr mr/dd/nl pasp         ?  Syncope            neurocardiogenic by tilt 1994         ?  Venous insufficiency           ?  Vitamin D deficiency            Past Surgical History:         Procedure  Laterality  Date          ?  HX BREAST BIOPSY  Left  2021          Dr Jennye Boroughs          ?  HX COLONOSCOPY              Dr Rosendo Gros 2007 neg; 04/22/18 neg          ?  HX HEMORRHOIDECTOMY              Dr. Rosendo Gros 2007          ?  HX HYSTERECTOMY    03/11/2017          Dr Sheral Flow          ?  HX ORTHOPAEDIC              DEXA -0.5 spine, -0.8 hip (1/18)          ?  HX TUBAL LIGATION         ?  HX UROLOGICAL    06/2016          Dr Jimmye Norman; bladder bx showed benign lesions          ?  [place] ABD COMP    9/05          negative          ?  VAS CAROTID DUPLEX BILATERAL    2004          negative          Social History          Socioeconomic History         ?  Marital status:  MARRIED              Spouse name:  Not on file         ?  Number of children:  2     ?  Years of education:  Not on file     ?  Highest education level:  Not on file       Occupational History         ?  Occupation:  benefits specialist       Tobacco Use         ?  Smoking status:  Never Smoker     ?  Smokeless tobacco:  Never Used       Substance and Sexual Activity         ?  Alcohol use:  Yes  Alcohol/week:  4.0 standard drinks         Types:  4 Glasses of wine per week         ?  Drug use:  No     ?  Sexual activity:  Not on file        Other Topics  Concern        ?  Not on file       Social History Narrative        ?  Not on file          Social Determinants of Health          Financial Resource Strain:         ?  Difficulty of Paying Living Expenses:  Not on file       Food Insecurity:         ?  Worried About Running Out of Food in the Last Year: Not on file     ?  Ran Out of Food in the Last Year: Not on file       Transportation Needs:         ?  Lack of Transportation (Medical): Not on file     ?  Lack of Transportation (Non-Medical): Not on file       Physical Activity:         ?  Days of Exercise per Week: Not on file     ?  Minutes of Exercise per Session: Not on file       Stress:         ?  Feeling of Stress : Not on file       Social Connections:         ?  Frequency of Communication with Friends and Family: Not on file     ?  Frequency of Social Gatherings with Friends and Family: Not on file     ?  Attends Religious Services: Not on file     ?  Active Member of Clubs or Organizations: Not on file     ?  Attends Archivist Meetings: Not on file     ?  Marital Status: Not on file       Intimate Partner Violence:         ?  Fear of Current or Ex-Partner: Not on file     ?  Emotionally Abused: Not on file     ?  Physically Abused: Not on file     ?  Sexually Abused: Not on file       Housing Stability:         ?  Unable to Pay for Housing in the Last Year: Not on file     ?  Number of Places Lived in the Last Year: Not on file        ?  Unstable Housing in the Last Year: Not on file          Current Outpatient Medications        Medication  Sig         ?  gabapentin (NEURONTIN) 300 mg capsule  Take 300 mg by mouth daily.     ?  Vitamin D2 1,250 mcg (50,000 unit) capsule  take 1 capsule by mouth EVERY MONTH     ?  atorvastatin (LIPITOR) 40 mg tablet  take 1 tablet by mouth once daily     ?  lansoprazole (PREVACID) 30  mg capsule  take 1 capsule by mouth BEFORE BREAKFAST DAILY     ?  Januvia 100 mg tablet  take 1 tablet by mouth once daily     ?  multivitamin (ONE A DAY) tablet  Take 1 Tab by mouth daily.     ?  citalopram (CELEXA) 10 mg tablet  take 1 tablet by mouth once daily     ?  Blood-Glucose Meter monitoring kit  Use daily as directed     ?   glucose blood VI test strips (BLOOD GLUCOSE TEST) strip  Use daily as directed     ?  lancets misc  Use daily as directed         ?  aspirin 81 mg chewable tablet  Take 81 mg by mouth daily.          No current facility-administered medications for this visit.          Allergies        Allergen  Reactions         ?  Fish Oil  Nausea Only             Patient denies         ?  Metformin  Diarrhea         ?  Relafen [Nabumetone]  Nausea Only        REVIEW OF SYSTEMS:  mammo 9/21, colo 6/19 Dr Rosendo Gros,  DEXA 1/18 Dr Margaretann Loveless - no vision change or eye pain   Oral - no mouth pain, tongue or tooth problems   Ears - no hearing loss, ear pain, fullness, no swallowing problems   Cardiac - no CP, PND, orthopnea, edema, palpitations or syncope   Chest - no breast masses   Resp - no wheezing, chronic coughing, dyspnea   Urinary - no dysuria, hematuria, flank pain, urgency, frequency      Visit Vitals      BP  114/60     Pulse  80     Temp  97.5 ??F (36.4 ??C) (Temporal)     Resp  16     Ht  '5\' 4"'  (1.626 m)     Wt  152 lb (68.9 kg)     SpO2  99%        BMI  26.09 kg/m??     Affect is appropriate.  Mood stable   No apparent distress   HEENT --Anicteric sclerae.  No JVD, or bruits.  Thyroid fullness on left   Lungs --Clear to auscultation and percussion, normal percussion.   Heart --Regular rate and rhythm, no murmurs, rubs, gallops, or clicks.   Abdomen -- Soft and nontender, no hepatosplenomegaly or masses.   Extremities -- Without cyanosis, clubbing, edema. 2+ pulses equally and bilaterally.  Varicose veins bilat      LABS   From 5/10 showed   gluc 110, cr 0.70,               alt 23,                                    chol 154, tg 143, hdl 45, ldl-c 80,  tsh 1.91   From 5/10 showed                     2 hr GTT 89   From 8/10 showed                                                              vit d 23.0, ck 57, aldo  5.4   From 8/11 showed   gluc 108,                                      hba1c 6.3,                   chol 160, tg 172, hdl 39, ldl-c 87,  wbc 5.,7  hb 12.3, plt 235, ua neg,     tsh 2.28   From 5/12 showed   gluc 113, cr 0.71, gfr 94,  alt 16, hba1c 6.2, ldl-p 1989, chol 177, tg 161, hdl 43, ldl-c 102   From 11/12 showed                                                     hba1c 5.9, ldl-p 1218, chol 133, tg 116, hdl 45, ldl-c 65   From 5/13 showed   gluc 105, cr 0.55, gfr 102, alt 8,  hba1c 6.2,                   chol 148, tg 107, hdl 45, ldl-c 82,  wbc 5.0, hb 12.5,  plt 172, vit d 40.3   From 11/13 showed        hba1c 6.2, ldl-p 1888, chol 176, tg 155, hdl 49, ldl-c 86,  wbc 6.2, hb 12.3, plt 182, vit d 34.4   From 5/14 showed        hba1c 6.2,     chol 152, tg 157, hdl 39, ldl-c 82   From 1/15 showed   gluc 113, cr 0.68, gfr>60, alt 11, hba1c 6.2,     chol 146, tg 130, hdl 43, ldl-c 77   From 7/15 showed        hba1c 6.3,     chol 133, tg 124, hdl 39, ldl-c 69, wbc 6.3, hb 12.2, plt 193   From 6/16 showed   gluc 106, cr 0.74, gfr>60, alt 26, hba1c 6.3,     chol 126, tg 125, hdl 46, ldl-c 55, wbc 5.7, hb 13.2, plt 203,  vit d 7.2,   tsh 1.69   From 12/16 showed gluc 110, cr 0.68, gfr>60, alt 30, hba1c 6.1,     chol 135, tg 147, hdl 47, ldl-c 59, wbc 6.0, hb 12.7, plt 197   From 6/17 showed   gluc 122, cr 0.71, gfr>60, alt 33, hba1c 6.2,     chol 153, tg 140, hdl 46, ldl-c 79, wbc 6.3, hb 13.1, plt 190,         tsh 1.92, hep c neg   From 1/18 showed  hba1c 6.2,    chol 154, tg 181, hdl 41, ldl-c 77   From 7/18 showed   gluc 129, cr 0.63, gfr>60, alt 26, hba1c 6.1,              wbc 6.0, hb 12.4, plt 193, vit d 79   From 1/19 showed   gluc 132, cr 0.83, gfr>60,    hba1c 6.1   From 4/19 showed        hba1c 7.0, umar 12 ,  chol 124, tg 109, hdl 46, ldl-c 56,                 vit d 56.1   From 7/19 showed   gluc 106, cr 0.67, gfr>60, alt 25, hba1c 6.0,               wbc 5.8, hb 13.0, plt 183   From 1/20 showed       hba1c 6.0, umar 17,   chol 130, tg 113, hdl 46,  ldl-c 61   From 8/20 showed   gluc 103, cr 0.73, gfr>60, alt 27, hba1c 5.7,     chol 124, tg 150, hdl 44, ldl-c 50,  wbc 5.0, hb13.0, plt 190   Form 2/21 showed        hba1c 5.9, umar na,          vit d 88.9   From 8/21 showed   gluc 109, cr 0.68, gfr>60, alt 35, hba1c 5.5,     chol 156, tg 142, hdl 51, ldl-c 77,  wbc 4.2, hb 9.9, plt 152,  vit d 34.8        Results for orders placed or performed during the hospital encounter of 62/37/62     METABOLIC PANEL, COMPREHENSIVE         Result  Value  Ref Range            Sodium  139  136 - 145 mmol/L       Potassium  4.1  3.5 - 5.5 mmol/L       Chloride  105  100 - 111 mmol/L       CO2  30  21 - 32 mmol/L       Anion gap  4  3.0 - 18 mmol/L       Glucose  100 (H)  74 - 99 mg/dL       BUN  9  7.0 - 18 MG/DL       Creatinine  0.68  0.6 - 1.3 MG/DL       BUN/Creatinine ratio  13  12 - 20         GFR est AA  >60  >60 ml/min/1.59m       GFR est non-AA  >60  >60 ml/min/1.767m      Calcium  9.3  8.5 - 10.1 MG/DL       Bilirubin, total  0.4  0.2 - 1.0 MG/DL       ALT (SGPT)  22  13 - 56 U/L       AST (SGOT)  18  10 - 38 U/L       Alk. phosphatase  78  45 - 117 U/L       Protein, total  6.9  6.4 - 8.2 g/dL       Albumin  3.4  3.4 - 5.0 g/dL       Globulin  3.5  2.0 - 4.0 g/dL  A-G Ratio  1.0  0.8 - 1.7         LIPID PANEL         Result  Value  Ref Range            LIPID PROFILE               Cholesterol, total  129  <200 MG/DL       Triglyceride  137  <150 MG/DL       HDL Cholesterol  48  40 - 60 MG/DL       LDL, calculated  53.6  0 - 100 MG/DL       VLDL, calculated  27.4  MG/DL       CHOL/HDL Ratio  2.7  0 - 5.0         HEMOGLOBIN A1C WITH EAG         Result  Value  Ref Range            Hemoglobin A1c  5.7 (H)  4.2 - 5.6 %            Est. average glucose  117  mg/dL        We reviewed the patient's labs from the last several visits to point out trends in the numbers              Patient Active Problem List        Diagnosis  Code         ?  Vitamin D deficiency  E55.9      ?  Anxiety  F41.9     ?  Dyslipidemia  E78.5     ?  Arthritis, degenerative  M19.90     ?  Overweight with body mass index (BMI) of 25 to 25.9 in adult  E66.3, Z68.25     ?  Controlled type 2 diabetes mellitus, without long-term current use of insulin (HCC)  E11.9     ?  Breast cancer of lower-outer quadrant of left female breast ([place])  C50.512         ?  Neuropathy due to chemotherapeutic drug (HCC)  G62.0, T45.1X5A        Assessment and plan:   1. DM.  Continue januvia and controlled.  F/u ophth   2. Hyperlipidemia. Continue lipitor and at target   3. Hypovitaminosis D. Continue current regimen.   4. Anxiety.  Continue celexa   5. Overweight.  Lifestyle and dietary measures.  Portion control reiterated.   6. Endometrial ca.  Per Dr Sheral Flow   7. Breast ca.  Per Dr Meryl Crutch and Dr Mariea Clonts   8. Possible neuropathy from chemo.  Gabapentin             RTC 9/22      Above conditions discussed at length and patient vocalized understanding.  All questions answered to patient satisfaction                   ICD-10-CM  ICD-9-CM             1.  Medicare annual wellness visit, subsequent   Z00.00  V70.0       2.  Controlled type 2 diabetes mellitus without complication, without long-term current use of insulin (HCC)   E11.9  250.00       3.  Neuropathy due to chemotherapeutic drug ([place])   G62.0  357.6  T45.1X5A  E933.1             4.  Malignant neoplasm of lower-outer quadrant of left breast of female, estrogen receptor negative (HCC)   C50.512  174.5              Z17.1  V86.1             5.  Dyslipidemia   E78.5  272.4       6.  Advanced directives, counseling/discussion   Z71.89  V65.49  ADVANCE CARE PLANNING FIRST 30 MINS           7.  Postmenopausal state   Z78.0  V49.81  DEXA BONE DENSITY STUDY AXIAL

## 2021-01-09 ENCOUNTER — Encounter

## 2021-01-10 ENCOUNTER — Ambulatory Visit: Payer: Medicare Other | Admitting: Rheumatology

## 2021-01-10 DIAGNOSIS — I7300 Raynaud's syndrome without gangrene: Secondary | ICD-10-CM

## 2021-01-10 DIAGNOSIS — Z8719 Personal history of other diseases of the digestive system: Secondary | ICD-10-CM

## 2021-01-10 DIAGNOSIS — Z8619 Personal history of other infectious and parasitic diseases: Secondary | ICD-10-CM

## 2021-01-10 DIAGNOSIS — M5134 Other intervertebral disc degeneration, thoracic region: Secondary | ICD-10-CM

## 2021-01-10 DIAGNOSIS — M1712 Unilateral primary osteoarthritis, left knee: Secondary | ICD-10-CM

## 2021-01-10 DIAGNOSIS — Z79899 Other long term (current) drug therapy: Secondary | ICD-10-CM

## 2021-01-10 DIAGNOSIS — Z96651 Presence of right artificial knee joint: Secondary | ICD-10-CM

## 2021-01-10 DIAGNOSIS — M1120 Other chondrocalcinosis, unspecified site: Secondary | ICD-10-CM

## 2021-01-10 DIAGNOSIS — Z9889 Other specified postprocedural states: Secondary | ICD-10-CM

## 2021-01-10 DIAGNOSIS — M0609 Rheumatoid arthritis without rheumatoid factor, multiple sites: Secondary | ICD-10-CM

## 2021-01-10 DIAGNOSIS — N3010 Interstitial cystitis (chronic) without hematuria: Secondary | ICD-10-CM

## 2021-01-10 DIAGNOSIS — M5030 Other cervical disc degeneration, unspecified cervical region: Secondary | ICD-10-CM

## 2021-01-10 DIAGNOSIS — M818 Other osteoporosis without current pathological fracture: Secondary | ICD-10-CM

## 2021-01-10 DIAGNOSIS — G5601 Carpal tunnel syndrome, right upper limb: Secondary | ICD-10-CM

## 2021-01-10 DIAGNOSIS — M797 Fibromyalgia: Secondary | ICD-10-CM

## 2021-01-10 DIAGNOSIS — R5382 Chronic fatigue, unspecified: Secondary | ICD-10-CM

## 2021-01-10 DIAGNOSIS — M5136 Other intervertebral disc degeneration, lumbar region: Secondary | ICD-10-CM

## 2021-01-10 DIAGNOSIS — Z8679 Personal history of other diseases of the circulatory system: Secondary | ICD-10-CM

## 2021-01-15 ENCOUNTER — Ambulatory Visit (INDEPENDENT_AMBULATORY_CARE_PROVIDER_SITE_OTHER): Payer: Medicare Other | Admitting: Family Medicine

## 2021-01-15 ENCOUNTER — Other Ambulatory Visit: Payer: Self-pay

## 2021-01-15 VITALS — BP 134/82 | HR 77 | Resp 20 | Ht 66.0 in | Wt 169.0 lb

## 2021-01-15 DIAGNOSIS — D510 Vitamin B12 deficiency anemia due to intrinsic factor deficiency: Secondary | ICD-10-CM | POA: Diagnosis not present

## 2021-01-15 DIAGNOSIS — E871 Hypo-osmolality and hyponatremia: Secondary | ICD-10-CM | POA: Diagnosis not present

## 2021-01-15 LAB — COMPLETE METABOLIC PANEL WITH GFR
AG Ratio: 1.4 (calc) (ref 1.0–2.5)
ALT: 166 U/L — ABNORMAL HIGH (ref 6–29)
AST: 78 U/L — ABNORMAL HIGH (ref 10–35)
Albumin: 4 g/dL (ref 3.6–5.1)
Alkaline phosphatase (APISO): 188 U/L — ABNORMAL HIGH (ref 37–153)
BUN: 16 mg/dL (ref 7–25)
CO2: 27 mmol/L (ref 20–32)
Calcium: 10 mg/dL (ref 8.6–10.4)
Chloride: 98 mmol/L (ref 98–110)
Creat: 0.77 mg/dL (ref 0.50–0.99)
GFR, Est African American: 91 mL/min/{1.73_m2} (ref >=60)
GFR, Est Non African American: 79 mL/min/{1.73_m2} (ref >=60)
Globulin: 2.8 g/dL (calc) (ref 1.9–3.7)
Glucose, Bld: 84 mg/dL (ref 65–99)
Potassium: 4.6 mmol/L (ref 3.5–5.3)
Sodium: 134 mmol/L — ABNORMAL LOW (ref 135–146)
Total Bilirubin: 0.6 mg/dL (ref 0.2–1.2)
Total Protein: 6.8 g/dL (ref 6.1–8.1)

## 2021-01-15 MED ORDER — CYANOCOBALAMIN 1000 MCG/ML IJ SOLN
1000.0000 ug | Freq: Once | INTRAMUSCULAR | Status: AC
  Administered 2021-01-15: 1000 ug via INTRAMUSCULAR

## 2021-01-15 NOTE — Progress Notes (Signed)
Agree with documentation as above.   Monroe Qin, MD  

## 2021-01-15 NOTE — Progress Notes (Signed)
Established Patient Office Visit  Subjective:  Patient ID: Mia [name], female    DOB: 1951-02-16  Age: 70 y.o. MRN: [id number]  CC:  Chief Complaint  Patient presents with  . Pernicious Anemia    HPI Mia [name] presents for a vitamin B12 injection. Denies gastrointestinal problems or dizziness. B12 injection to RUOQ (pt request) with no apparent complications. Patient advised to schedule next injection in 30 days.   Past Medical History:  Diagnosis Date  . Anal fissure   . Anemia   . Arthritis   . Blood transfusion   . C. difficile colitis   . Chest pain    a. 01/2013 MV: EF 59%, no ischemia.  . Chronic Dyspnea    a. 01/2013 Echo: EF 60-65%, Gr 1 DD, PASP 17mmHg.// Echo 01/2020: EF 60-65, no RWMA, GR 1 DD, GLS -21.6%, normal RV SF, trivial MR   . COVID-19   . DDD (degenerative disc disease), cervical   . DDD (degenerative disc disease), lumbar   . Diabetes mellitus   . Fibromyalgia   . Gall stones   . GERD (gastroesophageal reflux disease)    gastritis  . Hemorrhoids   . Hypertension   . Interstitial cystitis   . MCI (mild cognitive impairment) 11/25/2017  . Memory difficulty 12/14/2013  . Neuromuscular disorder ([place])    sclerosis  . Osteoporosis   . Peptic ulcer   . PONV (postoperative nausea and vomiting)   . Pseudogout   . Raynaud phenomenon   . Rectal bleeding   . Sleep apnea    a. on cpap.  Marland Kitchen SVT (supraventricular tachycardia) ([place])   . Syncope 09/10/2015  . Thyroid disease    hypothyroidism  . Tremor, essential 05/14/2016    Past Surgical History:  Procedure Laterality Date  . APPENDECTOMY    . BLADDER SURGERY     x2  . BLADDER SURGERY    . BREAST BIOPSY    . CARDIAC CATHETERIZATION    . CARPAL TUNNEL RELEASE    . CATARACT EXTRACTION Bilateral   . CHOLECYSTECTOMY    . DILATION AND CURETTAGE OF UTERUS    . ENTEROCELE REPAIR     x2  . EYE SURGERY     retina  . hysterectomy - unknown type    . JOINT REPLACEMENT    . KNEE ARTHROPLASTY    .  KNEE SURGERY     x6  . NECK SURGERY     fusion  . OOPHORECTOMY    . QUADRICEPS REPAIR Right   . RECTOCELE REPAIR     x2  . SHOULDER ARTHROSCOPY WITH SUBACROMIAL DECOMPRESSION, ROTATOR CUFF REPAIR AND BICEP TENDON REPAIR  10/06/2012   Procedure: SHOULDER ARTHROSCOPY WITH SUBACROMIAL DECOMPRESSION, ROTATOR CUFF REPAIR AND BICEP TENDON REPAIR;  Surgeon: Nita Sells, MD;  Location: [facility];  Service: Orthopedics;  Laterality: Right;  Arthroscopic  Repair  of  Subscapularis, Open Biceps Tenodesis  . SHOULDER SURGERY     bilateral- bones spur  . TONSILLECTOMY    . TOTAL KNEE ARTHROPLASTY    . TOTAL SHOULDER ARTHROPLASTY      Family History  Problem Relation Age of Onset  . Heart attack Mother   . Stroke Mother   . Diabetes Mother   . Heart disease Mother   . Colon polyps Mother   . Asthma Mother   . Breast cancer Maternal Grandmother   . Wilson's disease Maternal Grandmother   . Pancreatic cancer Maternal Grandfather   .  Heart disease Father   . Heart attack Father   . Asthma Father   . Stroke Sister   . Hypertension Sister   . Rheum arthritis Sister   . Dementia Sister   . Asthma Sister   . Lupus Sister   . Asthma Sister     Social History   Socioeconomic History  . Marital status: Married    Spouse name: Not on file  . Number of children: 2  . Years of education: 7  . Highest education level: Not on file  Occupational History  . Occupation: Retired  Tobacco Use  . Smoking status: Never Smoker  . Smokeless tobacco: Never Used  Vaping Use  . Vaping Use: Never used  Substance and Sexual Activity  . Alcohol use: No    Alcohol/week: 0.0 standard drinks  . Drug use: No  . Sexual activity: Not on file  Other Topics Concern  . Not on file  Social History Narrative   Patient drinks about 3-4 cups of caffeine daily.   Patient is right handed.   Social Determinants of Health   Financial Resource Strain: Not on file  Food Insecurity:  Not on file  Transportation Needs: Not on file  Physical Activity: Not on file  Stress: Not on file  Social Connections: Not on file  Intimate Partner Violence: Not on file    Outpatient Medications Prior to Visit  Medication Sig Dispense Refill  . amLODipine (NORVASC) 5 MG tablet Take 1 tablet (5 mg total) by mouth daily. 90 tablet 3  . Cyanocobalamin (VITAMIN B-12 IJ) Inject 1 mL as directed every 30 (thirty) days.     Marland Kitchen denosumab (PROLIA) 60 MG/ML SOLN injection Inject 60 mg into the skin every 6 (six) months. Administer in upper arm, thigh, or abdomen    . donepezil (ARICEPT) 10 MG tablet TAKE 1 TABLET AT BEDTIME 90 tablet 3  . fluticasone (FLONASE) 50 MCG/ACT nasal spray Place 2 sprays into both nostrils daily. 16 g 6  . gabapentin (NEURONTIN) 300 MG capsule TAKE 1 TO 2 CAPSULES BY MOUTH UP TO TWO TIMES DAILY AS NEEDED 180 capsule 3  . ipratropium (ATROVENT) 0.03 % nasal spray Place 2 sprays into both nostrils every 12 (twelve) hours. 30 mL 1  . leflunomide (ARAVA) 20 MG tablet Take 1 tablet (20 mg total) by mouth daily. 30 tablet 2  . losartan (COZAAR) 50 MG tablet Take 2 tablets (100 mg total) by mouth daily. Take 1 tablet in the morning and 1 tablet in evening as needed for blood pressure greater than 150 180 tablet 3  . Melatonin 10 MG CAPS Take 10 capsules by mouth daily.    . meloxicam (MOBIC) 15 MG tablet Take by mouth.    . memantine (NAMENDA) 10 MG tablet TAKE 1 TABLET TWICE DAILY 180 tablet 4  . metFORMIN (GLUCOPHAGE-XR) 500 MG 24 hr tablet TAKE 2 TABLETS EVERY DAY WITH BREAKFAST (Patient taking differently: Take 500 mg by mouth daily with breakfast.) 180 tablet 1  . metoprolol tartrate (LOPRESSOR) 100 MG tablet TAKE 1 AND 1/2 TABLETS TWICE DAILY 270 tablet 3  . ondansetron (ZOFRAN-ODT) 8 MG disintegrating tablet Take 1 tablet by mouth as needed for nausea.    . pantoprazole (PROTONIX) 40 MG tablet Take 40 mg by mouth 2 (two) times daily.    . polyethylene glycol powder  (GLYCOLAX/MIRALAX) 17 GM/SCOOP powder Take 17 g by mouth daily as needed for mild constipation or moderate constipation.     Marland Kitchen  primidone (MYSOLINE) 50 MG tablet TAKE 1 TABLET AT BEDTIME 90 tablet 1  . venlafaxine XR (EFFEXOR-XR) 75 MG 24 hr capsule TAKE 1 CAPSULE EVERY DAY WITH BREAKFAST 90 capsule 3   No facility-administered medications prior to visit.    Allergies  Allergen Reactions  . Codeine Nausea And Vomiting  . Myrbetriq  [Mirabegron] Other (See Comments)    SEVERE HEADACHE  . Percocet [Oxycodone-Acetaminophen] Nausea And Vomiting  . Propoxyphene Nausea And Vomiting  . Nystatin   . Celebrex [Celecoxib] Other (See Comments)    Other reaction(s): Other flushed  . Darvocet [Propoxyphene N-Acetaminophen] Nausea And Vomiting  . Erythromycin Nausea And Vomiting    Nausea and vomiting   . Hydrocodone Nausea And Vomiting  . Lyrica [Pregabalin] Swelling    Legs swelling   . Macrodantin [Nitrofurantoin] Nausea And Vomiting  . Toradol [Ketorolac Tromethamine] Nausea And Vomiting    Per patient, only PO form causes nausea and vomiting. Can take injection without issue  . Tramadol Nausea And Vomiting  . Verapamil Nausea And Vomiting    REACTION: intolerance    ROS Review of Systems    Objective:    Physical Exam  BP 134/82 (BP Location: Left Arm, Patient Position: Sitting, Cuff Size: Normal)   Pulse 77   Resp 20   Ht 5\' 6"  (1.676 m)   Wt 169 lb (76.7 kg)   SpO2 99%   BMI 27.28 kg/m  Wt Readings from Last 3 Encounters:  01/15/21 169 lb (76.7 kg)  12/23/20 169 lb (76.7 kg)  12/18/20 174 lb 6.4 oz (79.1 kg)     Health Maintenance Due  Topic Date Due  . OPHTHALMOLOGY EXAM  04/10/2020  . COVID-19 Vaccine (3 - Moderna risk 4-dose series) 04/25/2020  . HEMOGLOBIN A1C  12/12/2020    There are no preventive care reminders to display for this patient.  Lab Results  Component Value Date   TSH 1.34 05/21/2020   Lab Results  Component Value Date   WBC 7.7  12/09/2020   HGB 12.0 12/09/2020   HCT 37.2 12/09/2020   MCV 83.8 12/09/2020   PLT 394 12/09/2020   Lab Results  Component Value Date   NA 134 (L) 12/09/2020   K 4.7 12/09/2020   CO2 28 12/09/2020   GLUCOSE 86 12/09/2020   BUN 12 12/09/2020   CREATININE 0.94 12/09/2020   BILITOT 0.3 12/09/2020   ALKPHOS 73 05/21/2020   AST 23 12/09/2020   ALT 21 12/09/2020   PROT 6.5 12/09/2020   ALBUMIN 4.0 05/21/2020   CALCIUM 9.6 12/09/2020   ANIONGAP 12 02/22/2020   GFR 74.39 05/21/2020   Lab Results  Component Value Date   CHOL 128 06/07/2019   Lab Results  Component Value Date   HDL 52 06/07/2019   Lab Results  Component Value Date   LDLCALC 51 06/07/2019   Lab Results  Component Value Date   TRIG 97 11/07/2019   Lab Results  Component Value Date   CHOLHDL 2.5 06/07/2019   Lab Results  Component Value Date   HGBA1C 6.0 (A) 06/11/2020      Assessment & Plan:  B12 injection to RUOQ (pt request) with no apparent complications. Patient advised to schedule next injection in 30 days.  Problem List Items Addressed This Visit      Other   Pernicious anemia - Primary      Meds ordered this encounter  Medications  . cyanocobalamin ((VITAMIN B-12)) injection 1,000 mcg    Follow-up: Return  in about 1 month (around 02/15/2021) for B12 Injection.    Ninfa Meeker, CMA

## 2021-01-16 MED ORDER — CITALOPRAM 10 MG TAB
10 mg | ORAL_TABLET | ORAL | 3 refills | Status: DC
Start: 2021-01-16 — End: 2021-05-22

## 2021-01-16 NOTE — Progress Notes (Signed)
I have not seen her recently and your patient.

## 2021-01-16 NOTE — Progress Notes (Signed)
Office Visit Note  Patient: Mia [name]             Date of Birth: October 10, 1951           MRN: [id number]             PCP: Hali Marry, MD Referring: Hali Marry, * Visit Date: 01/29/2021 Occupation: @GUAROCC @  Subjective:  Other (Stress fractures in right foot in January 2022 and March 2022. Bilateral hip pain, left knee pain and left arm pain )   History of Present Illness: Mia [name] is a 70 y.o. female with a history of seronegative rheumatoid arthritis, osteoarthritis and degenerative disc disease.  She states she had another stress fracture in January 2022 and then in March 2022 in the right foot.  She is wearing a boot.  She has been on Prolia and has been taking calcium and vitamin D.  She states recently she was found to have elevated LFTs.  She had been taking meloxicam for neck pain.  She states she took the last tablet 2 days ago.  He is to have some discomfort in her hands and her knees.  She notices swelling in her knees and her thumbs.  Activities of Daily Living:  Patient reports morning stiffness for 20-30 minutes.   Patient Reports nocturnal pain.  Difficulty dressing/grooming: Denies Difficulty climbing stairs: Reports Difficulty getting out of chair: Reports Difficulty using hands for taps, buttons, cutlery, and/or writing: Denies  Review of Systems  Constitutional: Positive for fatigue.  HENT: Positive for mouth sores and mouth dryness. Negative for nose dryness.   Eyes: Negative for pain, itching and dryness.  Respiratory: Negative for shortness of breath and difficulty breathing.   Cardiovascular: Negative for chest pain and palpitations.  Gastrointestinal: Negative for blood in stool, constipation and diarrhea.  Endocrine: Negative for increased urination.  Genitourinary: Negative for difficulty urinating.  Musculoskeletal: Positive for arthralgias, joint pain, joint swelling, myalgias, morning stiffness, muscle tenderness and myalgias.   Skin: Positive for color change. Negative for rash and redness.  Allergic/Immunologic: Negative for susceptible to infections.  Neurological: Positive for numbness and memory loss. Negative for dizziness, headaches and weakness.  Hematological: Positive for bruising/bleeding tendency.  Psychiatric/Behavioral: Negative for confusion.    PMFS History:  Patient Active Problem List   Diagnosis Date Noted  . Pernicious anemia 09/11/2020  . Cyst of skin and subcutaneous tissue 09/11/2020  . 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 Weakness of both lower extremities 06/11/2020  . Hyponatremia 05/22/2020  . Gait instability 06/20/2019  . Facet arthritis of lumbar region 05/10/2018  . MCI (mild cognitive impairment) 11/25/2017  . Restrictive lung disease 07/30/2017  . Mixed hyperlipidemia 06/18/2017  . Rectocele 04/26/2017  . Irritable bowel syndrome with both constipation and diarrhea 04/26/2017  . Osteoporosis 04/18/2017  . Trochanteric bursitis of both hips 02/24/2017  . Stress fracture of left tibia 11/05/2016  . Inflammatory arthritis 09/30/2016  . High risk medication use 09/30/2016  . History of Clostridium difficile colitis 09/30/2016  . 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 Pseudogout 09/30/2016  . DDD cervical spine status post fusion 09/30/2016  . DDD thoracic spine 09/30/2016  . H/O total knee replacement, right 09/30/2016  . Age-related osteoporosis without current pathological fracture 09/30/2016  . Tremor, essential 05/14/2016  . Right foot pain 04/14/2016  . B12 deficiency 09/10/2015  . Syncope 09/10/2015  . Cervical facet joint syndrome 09/10/2015  . Chronic fatigue 09/10/2015  . Aortic atherosclerosis ([place]) 04/01/2015  .  DOE (dyspnea on exertion)   . Primary osteoarthritis of right knee 08/13/2014  . Benign head tremor 06/11/2014  . Lumbar degenerative disc disease 06/11/2014  . IFG (impaired fasting glucose) 03/09/2014  . Hemorrhoid 03/09/2014  .  Retinal wrinkling, right eye 03/09/2014  . Fibromyalgia 03/09/2014  . GERD (gastroesophageal reflux disease) 03/09/2014  . History of arthroplasty of right knee 01/31/2014  . Memory difficulty 12/14/2013  . Hemorrhoids, external, thrombosed 06/22/2011  . Hypertension 03/11/2011  . SVT (supraventricular tachycardia) ([place])   . Raynaud phenomenon   . EDEMA 08/25/2010  . Nonspecific (abnormal) findings on radiological and other examination of body structure 08/25/2010  . COMPUTERIZED TOMOGRAPHY, CHEST, ABNORMAL 08/25/2010  . OSA (obstructive sleep apnea) 04/24/2009  . Allergic rhinitis 06/11/2008  . Dyspnea 06/11/2008  . 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 Chronic diastolic CHF (congestive heart failure) ([place]) 06/08/2008  . INTERSTITIAL CYSTITIS 06/08/2008    Past Medical History:  Diagnosis Date  . Anal fissure   . Anemia   . Arthritis   . Blood transfusion   . C. difficile colitis   . Chest pain    a. 01/2013 MV: EF 59%, no ischemia.  . Chronic Dyspnea    a. 01/2013 Echo: EF 60-65%, Gr 1 DD, PASP 65mmHg.// Echo 01/2020: EF 60-65, no RWMA, GR 1 DD, GLS -21.6%, normal RV SF, trivial MR   . COVID-19   . DDD (degenerative disc disease), cervical   . DDD (degenerative disc disease), lumbar   . Diabetes mellitus   . Fibromyalgia   . Gall stones   . GERD (gastroesophageal reflux disease)    gastritis  . Hemorrhoids   . Hypertension   . Interstitial cystitis   . MCI (mild cognitive impairment) 11/25/2017  . Memory difficulty 12/14/2013  . Neuromuscular disorder ([place])    sclerosis  . Osteoporosis   . Peptic ulcer   . PONV (postoperative nausea and vomiting)   . Pseudogout   . Raynaud phenomenon   . Rectal bleeding   . Sleep apnea    a. on cpap.  Marland Kitchen SVT (supraventricular tachycardia) ([place])   . Syncope 09/10/2015  . Thyroid disease    hypothyroidism  . Tremor, essential 05/14/2016    Family History  Problem Relation Age of Onset  . Heart attack Mother   .  Stroke Mother   . Diabetes Mother   . Heart disease Mother   . Colon polyps Mother   . Asthma Mother   . Breast cancer Maternal Grandmother   . Wilson's disease Maternal Grandmother   . Pancreatic cancer Maternal Grandfather   . Heart disease Father   . Heart attack Father   . Asthma Father   . Stroke Sister   . Hypertension Sister   . Rheum arthritis Sister   . Dementia Sister   . Asthma Sister   . Lupus Sister   . Asthma Sister    Past Surgical History:  Procedure Laterality Date  . APPENDECTOMY    . BLADDER SURGERY     x2  . BLADDER SURGERY    . BREAST BIOPSY    . CARDIAC CATHETERIZATION    . CARPAL TUNNEL RELEASE    . CATARACT EXTRACTION Bilateral   . CHOLECYSTECTOMY    . DILATION AND CURETTAGE OF UTERUS    . ENTEROCELE REPAIR     x2  . EYE SURGERY     retina  . hysterectomy - unknown type    . JOINT REPLACEMENT    . KNEE  ARTHROPLASTY    . KNEE SURGERY     x6  . NECK SURGERY     fusion  . OOPHORECTOMY    . QUADRICEPS REPAIR Right   . RECTOCELE REPAIR     x2  . SHOULDER ARTHROSCOPY WITH SUBACROMIAL DECOMPRESSION, ROTATOR CUFF REPAIR AND BICEP TENDON REPAIR  10/06/2012   Procedure: SHOULDER ARTHROSCOPY WITH SUBACROMIAL DECOMPRESSION, ROTATOR CUFF REPAIR AND BICEP TENDON REPAIR;  Surgeon: Nita Sells, MD;  Location: [facility];  Service: Orthopedics;  Laterality: Right;  Arthroscopic  Repair  of  Subscapularis, Open Biceps Tenodesis  . SHOULDER SURGERY     bilateral- bones spur  . TONSILLECTOMY    . TOTAL KNEE ARTHROPLASTY    . TOTAL SHOULDER ARTHROPLASTY     Social History   Social History Narrative   Patient drinks about 3-4 cups of caffeine daily.   Patient is right handed.   Immunization History  Administered Date(s) Administered  . Fluad Quad(high Dose 65+) 07/13/2019, 07/10/2020  . Influenza Split 07/26/2012  . Influenza Whole 07/30/2009, 08/19/2010  . Influenza, High Dose Seasonal PF 07/06/2016, 07/05/2017  .  Influenza,inj,Quad PF,6+ Mos 06/11/2014, 07/25/2015, 06/23/2018  . Influenza-Unspecified 09/05/2013  . Moderna Sars-Covid-2 Vaccination 02/29/2020, 03/28/2020, 10/04/2020  . Pneumococcal Conjugate-13 01/04/2017  . Pneumococcal Polysaccharide-23 07/30/2009, 11/14/2018  . Tdap 02/13/2008, 05/12/2018  . Zoster 09/01/2011     Objective: Vital Signs: BP 114/69 (BP Location: Right Arm, Patient Position: Sitting, Cuff Size: Normal)   Pulse 73   Resp 15   Ht 5\' 6"  (1.676 m)   Wt 166 lb (75.3 kg)   BMI 26.79 kg/m    Physical Exam Vitals and nursing note reviewed.  Constitutional:      Appearance: She is well-developed.  HENT:     Head: Normocephalic and atraumatic.  Eyes:     Conjunctiva/sclera: Conjunctivae normal.  Cardiovascular:     Rate and Rhythm: Normal rate and regular rhythm.     Heart sounds: Normal heart sounds.  Pulmonary:     Effort: Pulmonary effort is normal.     Breath sounds: Normal breath sounds.  Abdominal:     General: Bowel sounds are normal.     Palpations: Abdomen is soft.  Musculoskeletal:     Cervical back: Normal range of motion.  Lymphadenopathy:     Cervical: No cervical adenopathy.  Skin:    General: Skin is warm and dry.     Capillary Refill: Capillary refill takes less than 2 seconds.  Neurological:     Mental Status: She is alert and oriented to person, place, and time.  Psychiatric:        Behavior: Behavior normal.      Musculoskeletal Exam: She had discomfort range of motion of her cervical spine.  Shoulder joints, elbow joints, wrist joints with good range of motion.  She has DIP and PIP thickening with no synovitis.  No synovitis was noted over wrist joint or MCPs.  Hip joints with good range of motion.  Right knee joint is replaced.  She had painful range of motion of her left knee joint without any warmth or swelling.  Her right foot was in a brace.  There was no tenderness over the left ankle or over MTPs.  CDAI Exam: CDAI Score: 0.4   Patient Global: 2 mm; Provider Global: 2 mm Swollen: 0 ; Tender: 0  Joint Exam 01/29/2021   No joint exam has been documented for this visit   There is currently no information  documented on the homunculus. Go to the Rheumatology activity and complete the homunculus joint exam.  Investigation: No additional findings.  Imaging: No results found.  Recent Labs: Lab Results  Component Value Date   WBC 7.7 12/09/2020   HGB 12.0 12/09/2020   PLT 394 12/09/2020   NA 134 (L) 01/15/2021   K 4.6 01/15/2021   CL 98 01/15/2021   CO2 27 01/15/2021   GLUCOSE 84 01/15/2021   BUN 16 01/15/2021   CREATININE 0.77 01/15/2021   BILITOT 0.6 01/15/2021   ALKPHOS 73 05/21/2020   AST 78 (H) 01/15/2021   ALT 166 (H) 01/15/2021   PROT 6.8 01/15/2021   ALBUMIN 4.0 05/21/2020   CALCIUM 10.0 01/15/2021   GFRAA 91 01/15/2021    Speciality Comments: No specialty comments available.  Procedures:  No procedures performed Allergies: Codeine, Myrbetriq  [mirabegron], Percocet [oxycodone-acetaminophen], Propoxyphene, Nystatin, Celebrex [celecoxib], Darvocet [propoxyphene n-acetaminophen], Erythromycin, Hydrocodone, Lyrica [pregabalin], Macrodantin [nitrofurantoin], Toradol [ketorolac tromethamine], Tramadol, and Verapamil   Assessment / Plan:     Visit Diagnoses: Seronegative rheumatoid arthritis-her rheumatoid arthritis is very well controlled on leflunomide.  She has been on it for many years.  She had no synovitis on my examination.  High risk medication use - Arava 20 mg 1 tablet by mouth daily.  Her most recent labs showed elevated LFTs.  She was advised to have repeat labs in May and then every 3 months to monitor for drug toxicity.  She was advised to discontinue leflunomide in case she gets an infection.  She may restart once infection resolves.  She is fully vaccinated against COVID-19.  Recommendation regarding the booster dose was also placed in the AVS.  Elevated LFTs-her LFTs were elevated  recently.  She has been taking meloxicam for the last few months due to neck pain.  She states she took the last dose 2 days ago.  I have advised her not to refill meloxicam.  She should discontinue all NSAIDs and Tylenol.  She had labs repeated today.  I would recommend for her to have repeat labs in 3 weeks after stopping meloxicam.  We may have to reduce the dose of leflunomide if LFTs are still elevated.  Raynaud's phenomenon without gangrene-keeping core temperature warm was discussed.  Primary osteoarthritis of left knee-she continues to have pain and discomfort in her left knee and difficulty walking.  She will have total knee replacement in the future.  H/O total knee replacement, right-doing well.  Pseudogout-she has not had any recent flare.  DDD (degenerative disc disease), cervical-she is been having increased pain and discomfort in her cervical region with left-sided radiculopathy.  She is scheduled to have MRI of her cervical spine.  DDD (degenerative disc disease), thoracic-chronic pain  DDD (degenerative disc disease), lumbar - She is followed by Dr. Lurene Shadow.    Fibromyalgia-she has generalized pain and discomfort and positive tender points.  Other osteoporosis without current pathological fracture - DEXA on 06/07/19 right femoral neck BMD 0.564 with T-score -2.6, +6%.  DEXA ordered and monitored by PCP.  She is on Prolia 60 mg sq injections every 6 months.  DEXA findings were discussed with the patient.  Use of calcium, vitamin D and resistive exercises were discussed.  S/P carpal tunnel release - Performed by Dr. Grandville Silos.  Carpal tunnel syndrome, right upper limb - NCV with EMG on 08/19/2017 moderate right median nerve entrapment at the wrist affecting sensory and motor components.  Carpal tunnel release surgery performed   Other medical problems are  listed as follows:  Chronic fatigue  History of CHF (congestive heart failure)  History of gastroesophageal reflux  (GERD)  History of hypertension-blood pressure is normal today.  Increased risk of heart disease with rheumatoid arthritis was discussed.  Dietary modifications and exercise were discussed.  Handout was placed in the AVS.  History of Clostridium difficile colitis  IC (interstitial cystitis)  Orders: No orders of the defined types were placed in this encounter.  No orders of the defined types were placed in this encounter.     Follow-Up Instructions: Return in about 5 months (around 07/01/2021) for Rheumatoid arthritis, Osteoarthritis.   Bo Merino, MD  Note - This record has been created using Editor, commissioning.  Chart creation errors have been sought, but may not always  have been located. Such creation errors do not reflect on  the standard of medical care.

## 2021-01-20 ENCOUNTER — Encounter: Payer: Self-pay | Admitting: Family Medicine

## 2021-01-23 DIAGNOSIS — M79671 Pain in right foot: Secondary | ICD-10-CM | POA: Diagnosis not present

## 2021-01-24 ENCOUNTER — Inpatient Hospital Stay: Admit: 2021-01-24 | Payer: MEDICARE | Attending: Internal Medicine | Primary: Internal Medicine

## 2021-01-24 DIAGNOSIS — E119 Type 2 diabetes mellitus without complications: Secondary | ICD-10-CM

## 2021-01-29 ENCOUNTER — Ambulatory Visit (INDEPENDENT_AMBULATORY_CARE_PROVIDER_SITE_OTHER): Payer: Medicare Other | Admitting: Rheumatology

## 2021-01-29 ENCOUNTER — Other Ambulatory Visit: Payer: Self-pay

## 2021-01-29 ENCOUNTER — Encounter: Payer: Self-pay | Admitting: Rheumatology

## 2021-01-29 VITALS — BP 114/69 | HR 73 | Resp 15 | Ht 66.0 in | Wt 166.0 lb

## 2021-01-29 DIAGNOSIS — I7300 Raynaud's syndrome without gangrene: Secondary | ICD-10-CM | POA: Diagnosis not present

## 2021-01-29 DIAGNOSIS — Z79899 Other long term (current) drug therapy: Secondary | ICD-10-CM

## 2021-01-29 DIAGNOSIS — Z8619 Personal history of other infectious and parasitic diseases: Secondary | ICD-10-CM

## 2021-01-29 DIAGNOSIS — Z96651 Presence of right artificial knee joint: Secondary | ICD-10-CM | POA: Diagnosis not present

## 2021-01-29 DIAGNOSIS — M818 Other osteoporosis without current pathological fracture: Secondary | ICD-10-CM

## 2021-01-29 DIAGNOSIS — M1712 Unilateral primary osteoarthritis, left knee: Secondary | ICD-10-CM | POA: Diagnosis not present

## 2021-01-29 DIAGNOSIS — Z8719 Personal history of other diseases of the digestive system: Secondary | ICD-10-CM

## 2021-01-29 DIAGNOSIS — M5134 Other intervertebral disc degeneration, thoracic region: Secondary | ICD-10-CM | POA: Diagnosis not present

## 2021-01-29 DIAGNOSIS — M1120 Other chondrocalcinosis, unspecified site: Secondary | ICD-10-CM | POA: Diagnosis not present

## 2021-01-29 DIAGNOSIS — M5030 Other cervical disc degeneration, unspecified cervical region: Secondary | ICD-10-CM

## 2021-01-29 DIAGNOSIS — R5382 Chronic fatigue, unspecified: Secondary | ICD-10-CM

## 2021-01-29 DIAGNOSIS — M797 Fibromyalgia: Secondary | ICD-10-CM | POA: Diagnosis not present

## 2021-01-29 DIAGNOSIS — M0609 Rheumatoid arthritis without rheumatoid factor, multiple sites: Secondary | ICD-10-CM | POA: Diagnosis not present

## 2021-01-29 DIAGNOSIS — R7989 Other specified abnormal findings of blood chemistry: Secondary | ICD-10-CM

## 2021-01-29 DIAGNOSIS — K580 Irritable bowel syndrome with diarrhea: Secondary | ICD-10-CM | POA: Diagnosis not present

## 2021-01-29 DIAGNOSIS — N3010 Interstitial cystitis (chronic) without hematuria: Secondary | ICD-10-CM

## 2021-01-29 DIAGNOSIS — M5136 Other intervertebral disc degeneration, lumbar region: Secondary | ICD-10-CM | POA: Diagnosis not present

## 2021-01-29 DIAGNOSIS — G5601 Carpal tunnel syndrome, right upper limb: Secondary | ICD-10-CM

## 2021-01-29 DIAGNOSIS — K219 Gastro-esophageal reflux disease without esophagitis: Secondary | ICD-10-CM | POA: Diagnosis not present

## 2021-01-29 DIAGNOSIS — Z8679 Personal history of other diseases of the circulatory system: Secondary | ICD-10-CM

## 2021-01-29 DIAGNOSIS — Z9889 Other specified postprocedural states: Secondary | ICD-10-CM | POA: Diagnosis not present

## 2021-01-29 NOTE — Patient Instructions (Signed)
Standing Labs We placed an order today for your standing lab work.   Please have your standing labs drawn in May and every 3 months  If possible, please have your labs drawn 2 weeks prior to your appointment so that the provider can discuss your results at your appointment.  We have open lab daily Monday through Thursday from 1:30-4:30 PM and Friday from 1:30-4:00 PM at the office of Dr. Bo Merino, [facility].   Please be advised, all patients with office appointments requiring lab work will take precedents over walk-in lab work.  If possible, please come for your lab work on Monday and Friday afternoons, as you may experience shorter wait times. The office is located at [address] [place], [place], [place] [address] No appointment is necessary.   Labs are drawn by [facility]. Please bring your co-pay at the time of your lab draw.  You may receive a bill from [facility] for your lab work.  If you wish to have your labs drawn at another location, please call the office 24 hours in advance to send orders.  If you have any questions regarding directions or hours of operation,  please call [phone].   As a reminder, please drink plenty of water prior to coming for your lab work. Thanks!   COVID-19 vaccine recommendations:   COVID-19 vaccine is recommended for everyone (unless you are allergic to a vaccine component), even if you are on a medication that suppresses your immune system.   The recommendations are that people on immunosuppressive agents should get first 3 COVID-19 vaccines a month apart and then a fourth dose (booster) 3 months after the third dose.  Do not take Tylenol or any anti-inflammatory medications (NSAIDs) 24 hours prior to the COVID-19 vaccination.   There is no direct evidence about the efficacy of the COVID-19 vaccine in individuals who are on medications that suppress the immune system.   Even if you are fully vaccinated, and you are on any  medications that suppress your immune system, please continue to wear a mask, maintain at least six feet social distance and practice hand hygiene.   If you develop a COVID-19 infection, please contact your PCP or our office to determine if you need monoclonal antibody infusion.  The booster vaccine is now available for immunocompromised patients.   Please see the following web sites for updated information.   [email or web address]   Vaccines You are taking a medication(s) that can suppress your immune system.  The following immunizations are recommended: . Flu annually . Covid-19  . Pneumonia (Pneumovax 23 and Prevnar 13 spaced at least 1 year apart) . Shingrix (after age 36)  Please check with your PCP to make sure you are up to date.   Heart Disease Prevention   Your inflammatory disease increases your risk of heart disease which includes heart attack, stroke, atrial fibrillation (irregular heartbeats), high blood pressure, heart failure and atherosclerosis (plaque in the arteries).  It is important to reduce your risk by:   . Keep blood pressure, cholesterol, and blood sugar at healthy levels   . Smoking Cessation   . Maintain a healthy weight  o BMI 20-25   . Eat a healthy diet  o Plenty of fresh fruit, vegetables, and whole grains  o Limit saturated fats, foods high in sodium, and added sugars  o DASH and Mediterranean diet   . Increase physical activity  o Recommend moderate physically activity for 150 minutes per week/ 30 minutes a  day for five days a week These can be broken up into three separate ten-minute sessions during the day.   . Reduce Stress  . Meditation, slow breathing exercises, yoga, coloring books  . Dental visits twice a year

## 2021-02-04 NOTE — Telephone Encounter (Signed)
Patient aware of message and verbalizes understanding.

## 2021-02-04 NOTE — Telephone Encounter (Signed)
pls call    DEXA shows unchanged osteopenia, recheck 2-3 years

## 2021-02-05 ENCOUNTER — Other Ambulatory Visit: Payer: Self-pay | Admitting: Neurology

## 2021-02-05 DIAGNOSIS — Z9049 Acquired absence of other specified parts of digestive tract: Secondary | ICD-10-CM | POA: Diagnosis not present

## 2021-02-05 DIAGNOSIS — R1011 Right upper quadrant pain: Secondary | ICD-10-CM | POA: Diagnosis not present

## 2021-02-05 DIAGNOSIS — R7989 Other specified abnormal findings of blood chemistry: Secondary | ICD-10-CM | POA: Diagnosis not present

## 2021-02-06 DIAGNOSIS — M4722 Other spondylosis with radiculopathy, cervical region: Secondary | ICD-10-CM | POA: Diagnosis not present

## 2021-02-06 DIAGNOSIS — Z981 Arthrodesis status: Secondary | ICD-10-CM | POA: Diagnosis not present

## 2021-02-06 DIAGNOSIS — M50121 Cervical disc disorder at C4-C5 level with radiculopathy: Secondary | ICD-10-CM | POA: Diagnosis not present

## 2021-02-06 DIAGNOSIS — M4802 Spinal stenosis, cervical region: Secondary | ICD-10-CM | POA: Diagnosis not present

## 2021-02-06 DIAGNOSIS — M5412 Radiculopathy, cervical region: Secondary | ICD-10-CM | POA: Diagnosis not present

## 2021-02-07 DIAGNOSIS — M79671 Pain in right foot: Secondary | ICD-10-CM | POA: Diagnosis not present

## 2021-02-12 ENCOUNTER — Telehealth: Payer: Self-pay

## 2021-02-12 ENCOUNTER — Other Ambulatory Visit: Payer: Self-pay

## 2021-02-12 ENCOUNTER — Ambulatory Visit (INDEPENDENT_AMBULATORY_CARE_PROVIDER_SITE_OTHER): Payer: Medicare Other | Admitting: Family Medicine

## 2021-02-12 VITALS — BP 133/74 | HR 79 | Temp 98.0°F | Resp 20 | Ht 66.0 in | Wt 164.0 lb

## 2021-02-12 DIAGNOSIS — E538 Deficiency of other specified B group vitamins: Secondary | ICD-10-CM | POA: Diagnosis not present

## 2021-02-12 DIAGNOSIS — M810 Age-related osteoporosis without current pathological fracture: Secondary | ICD-10-CM

## 2021-02-12 DIAGNOSIS — D510 Vitamin B12 deficiency anemia due to intrinsic factor deficiency: Secondary | ICD-10-CM | POA: Diagnosis not present

## 2021-02-12 DIAGNOSIS — M818 Other osteoporosis without current pathological fracture: Secondary | ICD-10-CM

## 2021-02-12 MED ORDER — CYANOCOBALAMIN 1000 MCG/ML IJ SOLN
1000.0000 ug | Freq: Once | INTRAMUSCULAR | Status: AC
  Administered 2021-02-12: 1000 ug via INTRAMUSCULAR

## 2021-02-12 NOTE — Telephone Encounter (Signed)
Emilya has Medicare. No PA required for Prolia. CMP done today.

## 2021-02-12 NOTE — Progress Notes (Signed)
Agree with documentation as above.   Rafal Archuleta, MD  

## 2021-02-12 NOTE — Progress Notes (Signed)
Established Patient Office Visit  Subjective:  Patient ID: Mia [name], female    DOB: 29-Dec-1950  Age: 70 y.o. MRN: [id number]  CC:  Chief Complaint  Patient presents with  . Pernicious Anemia    HPI Roxie Kreeger Carmical presents for .Patient is here for a vitamin B12 injection. Denies gastrointestinal problems or dizziness. B12 injection to LUOQ (pt request) with no apparent complications. Patient advised to schedule next injection in 30 days.   Past Medical History:  Diagnosis Date  . Anal fissure   . Anemia   . Arthritis   . Blood transfusion   . C. difficile colitis   . Chest pain    a. 01/2013 MV: EF 59%, no ischemia.  . Chronic Dyspnea    a. 01/2013 Echo: EF 60-65%, Gr 1 DD, PASP 34mmHg.// Echo 01/2020: EF 60-65, no RWMA, GR 1 DD, GLS -21.6%, normal RV SF, trivial MR   . COVID-19   . DDD (degenerative disc disease), cervical   . DDD (degenerative disc disease), lumbar   . Diabetes mellitus   . Fibromyalgia   . Gall stones   . GERD (gastroesophageal reflux disease)    gastritis  . Hemorrhoids   . Hypertension   . Interstitial cystitis   . MCI (mild cognitive impairment) 11/25/2017  . Memory difficulty 12/14/2013  . Neuromuscular disorder ([place])    sclerosis  . Osteoporosis   . Peptic ulcer   . PONV (postoperative nausea and vomiting)   . Pseudogout   . Raynaud phenomenon   . Rectal bleeding   . Sleep apnea    a. on cpap.  Marland Kitchen SVT (supraventricular tachycardia) ([place])   . Syncope 09/10/2015  . Thyroid disease    hypothyroidism  . Tremor, essential 05/14/2016    Past Surgical History:  Procedure Laterality Date  . APPENDECTOMY    . BLADDER SURGERY     x2  . BLADDER SURGERY    . BREAST BIOPSY    . CARDIAC CATHETERIZATION    . CARPAL TUNNEL RELEASE    . CATARACT EXTRACTION Bilateral   . CHOLECYSTECTOMY    . DILATION AND CURETTAGE OF UTERUS    . ENTEROCELE REPAIR     x2  . EYE SURGERY     retina  . hysterectomy - unknown type    . JOINT REPLACEMENT    . KNEE  ARTHROPLASTY    . KNEE SURGERY     x6  . NECK SURGERY     fusion  . OOPHORECTOMY    . QUADRICEPS REPAIR Right   . RECTOCELE REPAIR     x2  . SHOULDER ARTHROSCOPY WITH SUBACROMIAL DECOMPRESSION, ROTATOR CUFF REPAIR AND BICEP TENDON REPAIR  10/06/2012   Procedure: SHOULDER ARTHROSCOPY WITH SUBACROMIAL DECOMPRESSION, ROTATOR CUFF REPAIR AND BICEP TENDON REPAIR;  Surgeon: Nita Sells, MD;  Location: [facility];  Service: Orthopedics;  Laterality: Right;  Arthroscopic  Repair  of  Subscapularis, Open Biceps Tenodesis  . SHOULDER SURGERY     bilateral- bones spur  . TONSILLECTOMY    . TOTAL KNEE ARTHROPLASTY    . TOTAL SHOULDER ARTHROPLASTY      Family History  Problem Relation Age of Onset  . Heart attack Mother   . Stroke Mother   . Diabetes Mother   . Heart disease Mother   . Colon polyps Mother   . Asthma Mother   . Breast cancer Maternal Grandmother   . Wilson's disease Maternal Grandmother   . Pancreatic cancer  Maternal Grandfather   . Heart disease Father   . Heart attack Father   . Asthma Father   . Stroke Sister   . Hypertension Sister   . Rheum arthritis Sister   . Dementia Sister   . Asthma Sister   . Lupus Sister   . Asthma Sister     Social History   Socioeconomic History  . Marital status: Married    Spouse name: Not on file  . Number of children: 2  . Years of education: 78  . Highest education level: Not on file  Occupational History  . Occupation: Retired  Tobacco Use  . Smoking status: Never Smoker  . Smokeless tobacco: Never Used  Vaping Use  . Vaping Use: Never used  Substance and Sexual Activity  . Alcohol use: No    Alcohol/week: 0.0 standard drinks  . Drug use: No  . Sexual activity: Not on file  Other Topics Concern  . Not on file  Social History Narrative   Patient drinks about 3-4 cups of caffeine daily.   Patient is right handed.   Social Determinants of Health   Financial Resource Strain: Not on file   Food Insecurity: Not on file  Transportation Needs: Not on file  Physical Activity: Not on file  Stress: Not on file  Social Connections: Not on file  Intimate Partner Violence: Not on file    Outpatient Medications Prior to Visit  Medication Sig Dispense Refill  . amLODipine (NORVASC) 5 MG tablet Take 1 tablet (5 mg total) by mouth daily. 90 tablet 3  . Cyanocobalamin (VITAMIN B-12 IJ) Inject 1 mL as directed every 30 (thirty) days.     Marland Kitchen denosumab (PROLIA) 60 MG/ML SOLN injection Inject 60 mg into the skin every 6 (six) months. Administer in upper arm, thigh, or abdomen    . donepezil (ARICEPT) 10 MG tablet TAKE 1 TABLET AT BEDTIME 90 tablet 3  . gabapentin (NEURONTIN) 300 MG capsule TAKE 1 TO 2 CAPSULES BY MOUTH UP TO TWO TIMES DAILY AS NEEDED 180 capsule 3  . ipratropium (ATROVENT) 0.03 % nasal spray Place 2 sprays into both nostrils every 12 (twelve) hours. 30 mL 1  . leflunomide (ARAVA) 20 MG tablet Take 1 tablet (20 mg total) by mouth daily. 30 tablet 2  . losartan (COZAAR) 50 MG tablet Take 2 tablets (100 mg total) by mouth daily. Take 1 tablet in the morning and 1 tablet in evening as needed for blood pressure greater than 150 180 tablet 3  . Melatonin 10 MG CAPS Take 10 capsules by mouth daily.    . meloxicam (MOBIC) 15 MG tablet Take 15 mg by mouth daily.    . memantine (NAMENDA) 10 MG tablet TAKE 1 TABLET TWICE DAILY 180 tablet 4  . metFORMIN (GLUCOPHAGE-XR) 500 MG 24 hr tablet TAKE 2 TABLETS EVERY DAY WITH BREAKFAST (Patient taking differently: Take 500 mg by mouth daily with breakfast.) 180 tablet 1  . metoprolol tartrate (LOPRESSOR) 100 MG tablet TAKE 1 AND 1/2 TABLETS TWICE DAILY 270 tablet 3  . ondansetron (ZOFRAN-ODT) 8 MG disintegrating tablet Take 1 tablet by mouth as needed for nausea.    . pantoprazole (PROTONIX) 40 MG tablet Take 40 mg by mouth 2 (two) times daily.    . polyethylene glycol powder (GLYCOLAX/MIRALAX) 17 GM/SCOOP powder Take 17 g by mouth daily as  needed for mild constipation or moderate constipation.     . primidone (MYSOLINE) 50 MG tablet TAKE 1 TABLET AT BEDTIME  90 tablet 1  . venlafaxine XR (EFFEXOR-XR) 75 MG 24 hr capsule TAKE 1 CAPSULE EVERY DAY WITH BREAKFAST 90 capsule 3   No facility-administered medications prior to visit.    Allergies  Allergen Reactions  . Codeine Nausea And Vomiting  . Myrbetriq  [Mirabegron] Other (See Comments)    SEVERE HEADACHE  . Percocet [Oxycodone-Acetaminophen] Nausea And Vomiting  . Propoxyphene Nausea And Vomiting  . Nystatin   . Celebrex [Celecoxib] Other (See Comments)    Other reaction(s): Other flushed  . Darvocet [Propoxyphene N-Acetaminophen] Nausea And Vomiting  . Erythromycin Nausea And Vomiting    Nausea and vomiting   . Hydrocodone Nausea And Vomiting  . Lyrica [Pregabalin] Swelling    Legs swelling   . Macrodantin [Nitrofurantoin] Nausea And Vomiting  . Toradol [Ketorolac Tromethamine] Nausea And Vomiting    Per patient, only PO form causes nausea and vomiting. Can take injection without issue  . Tramadol Nausea And Vomiting  . Verapamil Nausea And Vomiting    REACTION: intolerance    ROS Review of Systems    Objective:    Physical Exam  BP 133/74 (BP Location: Left Arm, Patient Position: Sitting, Cuff Size: Normal)   Pulse 79   Temp 98 F (36.7 C) (Oral)   Resp 20   Ht 5\' 6"  (1.676 m)   Wt 164 lb (74.4 kg)   SpO2 99%   BMI 26.47 kg/m  Wt Readings from Last 3 Encounters:  02/12/21 164 lb (74.4 kg)  01/29/21 166 lb (75.3 kg)  01/15/21 169 lb (76.7 kg)     Health Maintenance Due  Topic Date Due  . OPHTHALMOLOGY EXAM  04/10/2020    There are no preventive care reminders to display for this patient.  Lab Results  Component Value Date   TSH 1.34 05/21/2020   Lab Results  Component Value Date   WBC 7.7 12/09/2020   HGB 12.0 12/09/2020   HCT 37.2 12/09/2020   MCV 83.8 12/09/2020   PLT 394 12/09/2020   Lab Results  Component Value Date    NA 134 (L) 01/15/2021   K 4.6 01/15/2021   CO2 27 01/15/2021   GLUCOSE 84 01/15/2021   BUN 16 01/15/2021   CREATININE 0.77 01/15/2021   BILITOT 0.6 01/15/2021   ALKPHOS 73 05/21/2020   AST 78 (H) 01/15/2021   ALT 166 (H) 01/15/2021   PROT 6.8 01/15/2021   ALBUMIN 4.0 05/21/2020   CALCIUM 10.0 01/15/2021   ANIONGAP 12 02/22/2020   GFR 74.39 05/21/2020   Lab Results  Component Value Date   CHOL 128 06/07/2019   Lab Results  Component Value Date   HDL 52 06/07/2019   Lab Results  Component Value Date   LDLCALC 51 06/07/2019   Lab Results  Component Value Date   TRIG 97 11/07/2019   Lab Results  Component Value Date   CHOLHDL 2.5 06/07/2019   Lab Results  Component Value Date   HGBA1C 6.0 (A) 06/11/2020      Assessment & Plan:  B12 injection to LUOQ (pt request) with no apparent complications. Patient advised to schedule next injection in 30 days. CMP ordered for labs required for upcoming Prolia injection.  Problem List Items Addressed This Visit      Musculoskeletal and Integument   Age-related osteoporosis without current pathological fracture   Osteoporosis - Primary   Relevant Orders   COMPLETE METABOLIC PANEL WITH GFR     Other   B12 deficiency   Pernicious anemia  Meds ordered this encounter  Medications  . cyanocobalamin ((VITAMIN B-12)) injection 1,000 mcg    Follow-up: Return in about 1 month (around 03/14/2021) for B12 injection.    Ninfa Meeker, CMA

## 2021-02-13 DIAGNOSIS — M542 Cervicalgia: Secondary | ICD-10-CM | POA: Diagnosis not present

## 2021-02-13 LAB — COMPLETE METABOLIC PANEL WITH GFR
AG Ratio: 1.8 (calc) (ref 1.0–2.5)
ALT: 234 U/L — ABNORMAL HIGH (ref 6–29)
AST: 372 U/L — ABNORMAL HIGH (ref 10–35)
Albumin: 4.1 g/dL (ref 3.6–5.1)
Alkaline phosphatase (APISO): 163 U/L — ABNORMAL HIGH (ref 37–153)
BUN: 12 mg/dL (ref 7–25)
CO2: 27 mmol/L (ref 20–32)
Calcium: 9.5 mg/dL (ref 8.6–10.4)
Chloride: 101 mmol/L (ref 98–110)
Creat: 0.73 mg/dL (ref 0.50–0.99)
GFR, Est African American: 97 mL/min/{1.73_m2} (ref >=60)
GFR, Est Non African American: 84 mL/min/{1.73_m2} (ref >=60)
Globulin: 2.3 g/dL (calc) (ref 1.9–3.7)
Glucose, Bld: 111 mg/dL (ref 65–139)
Potassium: 4.4 mmol/L (ref 3.5–5.3)
Sodium: 135 mmol/L (ref 135–146)
Total Bilirubin: 1.3 mg/dL — ABNORMAL HIGH (ref 0.2–1.2)
Total Protein: 6.4 g/dL (ref 6.1–8.1)

## 2021-02-17 DIAGNOSIS — R7989 Other specified abnormal findings of blood chemistry: Secondary | ICD-10-CM | POA: Diagnosis not present

## 2021-02-25 ENCOUNTER — Telehealth: Payer: Self-pay | Admitting: *Deleted

## 2021-02-25 DIAGNOSIS — Z23 Encounter for immunization: Secondary | ICD-10-CM | POA: Diagnosis not present

## 2021-02-25 NOTE — Telephone Encounter (Signed)
Returning pt's call. No information given on VM about what this was about.

## 2021-02-26 ENCOUNTER — Ambulatory Visit (INDEPENDENT_AMBULATORY_CARE_PROVIDER_SITE_OTHER): Payer: Medicare Other | Admitting: Physician Assistant

## 2021-02-26 DIAGNOSIS — Z0000 Encounter for general adult medical examination without abnormal findings: Secondary | ICD-10-CM

## 2021-02-26 DIAGNOSIS — Z780 Asymptomatic menopausal state: Secondary | ICD-10-CM | POA: Diagnosis not present

## 2021-02-26 DIAGNOSIS — Z1231 Encounter for screening mammogram for malignant neoplasm of breast: Secondary | ICD-10-CM

## 2021-02-26 NOTE — Progress Notes (Signed)
[place] VISIT  02/26/2021  Telephone Visit Disclaimer This Medicare AWV was conducted by telephone due to national recommendations for restrictions regarding the COVID-19 Pandemic (e.g. social distancing).  I verified, using two identifiers, that I am speaking with Mia [name] or their authorized healthcare agent. I discussed the limitations, risks, security, and privacy concerns of performing an evaluation and management service by telephone and the potential availability of an in-person appointment in the future. The patient expressed understanding and agreed to proceed.  Location of Patient: At home.  Location of Provider (nurse):  In the office.  Subjective:    Mia [name] is a 71 y.o. female patient of Metheney, Rene Kocher, MD who had a Medicare Annual Wellness Visit today via telephone. Mia [name] is Retired and lives with their spouse. she has 2 children. she reports that she is socially active and does interact with friends/family regularly. she is minimally physically active and enjoys hanging out with her friends.  Patient Care Team: Hali Marry, MD as PCP - General (Family Medicine) Nahser, Wonda Cheng, MD as PCP - Cardiology (Cardiology) Silverio Decamp, MD as Consulting Physician (Sports Medicine) Bo Merino, MD as Consulting Physician (Rheumatology) Carolan Clines, MD (Inactive) as Consulting Physician (Urology) Collene Gobble, MD as Consulting Physician (Pulmonary Disease) Richmond Campbell, MD as Consulting Physician (Gastroenterology) Zadie Rhine Clent Demark, MD as Consulting Physician (Ophthalmology) Dr. [name] as Referring Physician (Neurology) Daubert, Marlise Eves, MD as Consulting Physician (Orthopedic Surgery) Eusebio Friendly, MD as Referring Physician (Internal Medicine)  Advanced Directives 02/26/2021 02/22/2020 11/16/2019 11/07/2019 11/06/2019 06/30/2018 06/20/2016  Does Patient Have a Medical Advance Directive? Yes No Yes Yes No No  No  Type of Paramedic of [place];Living will - Living will Living will - - -  Does patient want to make changes to medical advance directive? No - Patient declined - - No - Patient declined - - -  Copy of [place] in Chart? No - copy requested - - - - - -  Would patient like information on creating a medical advance directive? - No - Patient declined No - Patient declined No - Patient declined Yes (ED - Information included in AVS) No - Patient declined No - patient declined information    Hospital Utilization Over the Past 12 Months: # of hospitalizations or ER visits: 0 # of surgeries: 0  Review of Systems    Patient reports that her overall health is worse compared to last year.  History obtained from chart review and the patient  Patient Reported Readings (BP, Pulse, CBG, Weight, etc) none  Pain Assessment Pain : No/denies pain     Current Medications & Allergies (verified) Allergies as of 02/26/2021      Reactions   Codeine Nausea And Vomiting   Myrbetriq  [mirabegron] Other (See Comments)   SEVERE HEADACHE   Percocet [oxycodone-acetaminophen] Nausea And Vomiting   Propoxyphene Nausea And Vomiting   Nystatin    Celebrex [celecoxib] Other (See Comments)   Other reaction(s): Other flushed   Darvocet [propoxyphene N-acetaminophen] Nausea And Vomiting   Erythromycin Nausea And Vomiting   Nausea and vomiting   Hydrocodone Nausea And Vomiting   Lyrica [pregabalin] Swelling   Legs swelling   Macrodantin [nitrofurantoin] Nausea And Vomiting   Toradol [ketorolac Tromethamine] Nausea And Vomiting   Per patient, only PO form causes nausea and vomiting. Can take injection without issue   Tramadol Nausea And Vomiting   Verapamil Nausea And  Vomiting   REACTION: intolerance      Medication List       Accurate as of Feb 26, 2021  3:20 PM. If you have any questions, ask your nurse or doctor.        amLODipine 5 MG  tablet Commonly known as: NORVASC Take 1 tablet (5 mg total) by mouth daily.   colestipol 1 g tablet Commonly known as: COLESTID Take 1 g by mouth 2 (two) times daily.   denosumab 60 MG/ML Soln injection Commonly known as: PROLIA Inject 60 mg into the skin every 6 (six) months. Administer in upper arm, thigh, or abdomen   donepezil 10 MG tablet Commonly known as: ARICEPT TAKE 1 TABLET AT BEDTIME   gabapentin 300 MG capsule Commonly known as: NEURONTIN TAKE 1 TO 2 CAPSULES BY MOUTH UP TO TWO TIMES DAILY AS NEEDED   ibuprofen 600 MG tablet Commonly known as: ADVIL ibuprofen 600 mg tablet  TAKE 1 TABLET BY MOUTH EVERY 6 HOURS AS NEEDED   ipratropium 0.03 % nasal spray Commonly known as: ATROVENT Place 2 sprays into both nostrils every 12 (twelve) hours.   leflunomide 20 MG tablet Commonly known as: ARAVA Take 1 tablet (20 mg total) by mouth daily.   losartan 50 MG tablet Commonly known as: COZAAR Take 2 tablets (100 mg total) by mouth daily. Take 1 tablet in the morning and 1 tablet in evening as needed for blood pressure greater than 150   Melatonin 10 MG Caps Take 10 capsules by mouth daily.   meloxicam 15 MG tablet Commonly known as: MOBIC Take 15 mg by mouth daily.   memantine 10 MG tablet Commonly known as: NAMENDA TAKE 1 TABLET TWICE DAILY   metFORMIN 500 MG 24 hr tablet Commonly known as: GLUCOPHAGE-XR TAKE 2 TABLETS EVERY DAY WITH BREAKFAST What changed: See the new instructions.   metoprolol tartrate 100 MG tablet Commonly known as: LOPRESSOR TAKE 1 AND 1/2 TABLETS TWICE DAILY   ondansetron 8 MG disintegrating tablet Commonly known as: ZOFRAN-ODT Take 1 tablet by mouth as needed for nausea.   pantoprazole 40 MG tablet Commonly known as: PROTONIX Take 40 mg by mouth 2 (two) times daily.   polyethylene glycol powder 17 GM/SCOOP powder Commonly known as: GLYCOLAX/MIRALAX Take 17 g by mouth daily as needed for mild constipation or moderate  constipation.   primidone 50 MG tablet Commonly known as: MYSOLINE TAKE 1 TABLET AT BEDTIME   venlafaxine XR 75 MG 24 hr capsule Commonly known as: EFFEXOR-XR TAKE 1 CAPSULE EVERY DAY WITH BREAKFAST   VITAMIN B-12 IJ Inject 1 mL as directed every 30 (thirty) days.       History (reviewed): Past Medical History:  Diagnosis Date  . Anal fissure   . Anemia   . Arthritis   . Blood transfusion   . C. difficile colitis   . Chest pain    a. 01/2013 MV: EF 59%, no ischemia.  . Chronic Dyspnea    a. 01/2013 Echo: EF 60-65%, Gr 1 DD, PASP 14mmHg.// Echo 01/2020: EF 60-65, no RWMA, GR 1 DD, GLS -21.6%, normal RV SF, trivial MR   . COVID-19   . DDD (degenerative disc disease), cervical   . DDD (degenerative disc disease), lumbar   . Diabetes mellitus   . Fibromyalgia   . Gall stones   . GERD (gastroesophageal reflux disease)    gastritis  . Hemorrhoids   . Hypertension   . Interstitial cystitis   . 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 Memory difficulty 12/14/2013  . Neuromuscular disorder ([place])    sclerosis  . Osteoporosis   . Peptic ulcer   . PONV (postoperative nausea and vomiting)   . Pseudogout   . Raynaud phenomenon   . Rectal bleeding   . Sleep apnea    a. on cpap.  Marland Kitchen SVT (supraventricular tachycardia) ([place])   . Syncope 09/10/2015  . Thyroid disease    hypothyroidism  . Tremor, essential 05/14/2016   Past Surgical History:  Procedure Laterality Date  . APPENDECTOMY    . BLADDER SURGERY     x2  . BLADDER SURGERY    . BREAST BIOPSY    . CARDIAC CATHETERIZATION    . CARPAL TUNNEL RELEASE    . CATARACT EXTRACTION Bilateral   . CHOLECYSTECTOMY    . DILATION AND CURETTAGE OF UTERUS    . ENTEROCELE REPAIR     x2  . EYE SURGERY     retina  . hysterectomy - unknown type    . JOINT REPLACEMENT    . KNEE ARTHROPLASTY    . KNEE SURGERY     x6  . NECK SURGERY     fusion  . OOPHORECTOMY    . QUADRICEPS REPAIR Right   . RECTOCELE REPAIR     x2  .  SHOULDER ARTHROSCOPY WITH SUBACROMIAL DECOMPRESSION, ROTATOR CUFF REPAIR AND BICEP TENDON REPAIR  10/06/2012   Procedure: SHOULDER ARTHROSCOPY WITH SUBACROMIAL DECOMPRESSION, ROTATOR CUFF REPAIR AND BICEP TENDON REPAIR;  Surgeon: Nita Sells, MD;  Location: [facility];  Service: Orthopedics;  Laterality: Right;  Arthroscopic  Repair  of  Subscapularis, Open Biceps Tenodesis  . SHOULDER SURGERY     bilateral- bones spur  . TONSILLECTOMY    . TOTAL KNEE ARTHROPLASTY    . TOTAL SHOULDER ARTHROPLASTY     Family History  Problem Relation Age of Onset  . Heart attack Mother   . Stroke Mother   . Diabetes Mother   . Heart disease Mother   . Colon polyps Mother   . Asthma Mother   . Breast cancer Maternal Grandmother   . Wilson's disease Maternal Grandmother   . Pancreatic cancer Maternal Grandfather   . Heart disease Father   . Heart attack Father   . Asthma Father   . Stroke Sister   . Hypertension Sister   . Rheum arthritis Sister   . Dementia Sister   . Asthma Sister   . Lupus Sister   . Asthma Sister    Social History   Socioeconomic History  . Marital status: Married    Spouse name: Clarnce Flock"  . Number of children: 2  . Years of education: 64  . Highest education level: Associate degree: academic program  Occupational History  . Occupation: Retired  Tobacco Use  . Smoking status: Never Smoker  . Smokeless tobacco: Never Used  Vaping Use  . Vaping Use: Never used  Substance and Sexual Activity  . Alcohol use: No    Alcohol/week: 0.0 standard drinks  . Drug use: No  . Sexual activity: Not on file  Other Topics Concern  . Not on file  Social History Narrative   Lives with her husband. She likes to hang out with her friends and play games and do bible study.   Social Determinants of Health   Financial Resource Strain: Low Risk   . Difficulty of Paying Living Expenses: Not hard at all  Food Insecurity: No Food Insecurity  .  Worried  About Charity fundraiser in the Last Year: Never true  . Ran Out of Food in the Last Year: Never true  Transportation Needs: No Transportation Needs  . Lack of Transportation (Medical): No  . Lack of Transportation (Non-Medical): No  Physical Activity: Inactive  . Days of Exercise per Week: 0 days  . Minutes of Exercise per Session: 0 min  Stress: No Stress Concern Present  . Feeling of Stress : Not at all  Social Connections: Socially Integrated  . Frequency of Communication with Friends and Family: More than three times a week  . Frequency of Social Gatherings with Friends and Family: More than three times a week  . Attends Religious Services: More than 4 times per year  . Active Member of Clubs or Organizations: Yes  . Attends Archivist Meetings: More than 4 times per year  . Marital Status: Married    Activities of Daily Living In your present state of health, do you have any difficulty performing the following activities: 02/26/2021 02/12/2021  Hearing? N N  Vision? N N  Difficulty concentrating or making decisions? N N  Walking or climbing stairs? Y N  Comment due to back pain and knee pain. -  Dressing or bathing? N N  Doing errands, shopping? N N  Preparing Food and eating ? N -  Using the Toilet? N -  In the past six months, have you accidently leaked urine? Y -  Comment hypersenstive bladder and intersitial cystitis -  Do you have problems with loss of bowel control? N -  Managing your Medications? N -  Managing your Finances? N -  Housekeeping or managing your Housekeeping? Y -  Comment her husband helps with that. -  Some recent data might be hidden    Patient Education/ Literacy How often do you need to have someone help you when you read instructions, pamphlets, or other written materials from your doctor or pharmacy?: 1 - Never What is the last grade level you completed in school?: Associates degree and extra certification  Exercise Current  Exercise Habits: The patient does not participate in regular exercise at present, Exercise limited by: None identified  Diet Patient reports consuming 2 meals a day and 3-4 snack(s) a day Patient reports that her primary diet is: Regular Patient reports that she does have regular access to food.   Depression Screen PHQ 2/9 Scores 02/26/2021 11/14/2018 08/09/2018 02/03/2018 07/30/2017 01/04/2017  PHQ - 2 Score 0 0 0 0 0 0     Fall Risk Fall Risk  02/26/2021 11/19/2020 09/18/2019 09/12/2019 06/02/2018  Falls in the past year? 1 1 1  0 No  Comment - - Emmi Telephone Survey: data to providers prior to load - -  Number falls in past yr: 1 1 1 1  -  Comment - - Emmi Telephone Survey Actual Response = 2 - -  Injury with Fall? 1 1 0 0 -  Risk Factor Category  - - - - -  Risk for fall due to : History of fall(s) Impaired balance/gait;History of fall(s) - Impaired balance/gait -  Follow up Falls evaluation completed;Education provided - - Falls prevention discussed -     Objective:  Mia [name] seemed alert and oriented and she participated appropriately during our telephone visit.  Blood Pressure Weight BMI  BP Readings from Last 3 Encounters:  02/12/21 133/74  01/29/21 114/69  01/15/21 134/82   Wt Readings from Last 3 Encounters:  02/12/21 164 lb (  74.4 kg)  01/29/21 166 lb (75.3 kg)  01/15/21 169 lb (76.7 kg)   BMI Readings from Last 1 Encounters:  02/12/21 26.47 kg/m    *Unable to obtain current vital signs, weight, and BMI due to telephone visit type  Hearing/Vision  . Horace did not seem to have difficulty with hearing/understanding during the telephone conversation . Reports that she has not had a formal eye exam by an eye care professional within the past year . Reports that she has not had a formal hearing evaluation within the past year *Unable to fully assess hearing and vision during telephone visit type  Cognitive Function: 6CIT Screen 02/26/2021  What Year? 0 points  What  month? 0 points  What time? 0 points  Count back from 20 0 points  Months in reverse 0 points  Repeat phrase 0 points  Total Score 0   (Normal:0-7, Significant for Dysfunction: >8)  Normal Cognitive Function Screening: Yes   Immunization & Health Maintenance Record Immunization History  Administered Date(s) Administered  . Fluad Quad(high Dose 65+) 07/13/2019, 07/10/2020  . Hepatitis B, adult 02/25/2021  . Influenza Split 07/26/2012, 06/11/2014, 07/25/2015, 06/23/2018  . Influenza Whole 07/30/2009, 08/19/2010  . Influenza, High Dose Seasonal PF 07/06/2016, 07/05/2017  . Influenza,inj,Quad PF,6+ Mos 06/11/2014, 07/25/2015, 06/23/2018  . Influenza-Unspecified 07/26/2013, 09/05/2013, 07/13/2019, 07/10/2020  . Moderna Sars-Covid-2 Vaccination 02/29/2020, 03/28/2020, 10/04/2020  . Pneumococcal Conjugate-13 01/04/2017  . Pneumococcal Polysaccharide-23 07/30/2009, 11/14/2018  . Pneumococcal-Unspecified 10/27/2015  . Tdap 02/13/2008, 05/12/2018  . Zoster 09/01/2011    Health Maintenance  Topic Date Due  . OPHTHALMOLOGY EXAM  02/26/2021 (Originally 04/10/2020)  . HEMOGLOBIN A1C  03/09/2021 (Originally 12/12/2020)  . FOOT EXAM  10/03/2021 (Originally 11/15/2019)  . COVID-19 Vaccine (4 - Booster for Moderna series) 04/04/2021  . INFLUENZA VACCINE  05/26/2021  . DEXA SCAN  06/06/2021  . MAMMOGRAM  06/12/2022  . COLONOSCOPY (Pts 45-44yrs Insurance coverage will need to be confirmed)  03/05/2027  . TETANUS/TDAP  05/12/2028  . Hepatitis C Screening  Completed  . PNA vac Low Risk Adult  Completed  . HPV VACCINES  Aged Out       Assessment  This is a routine wellness examination for Mia [name].  Health Maintenance: Due or Overdue There are no preventive care reminders to display for this patient.  Mia [name] does not need a referral for Community Assistance: Care Management:   no Social Work:    no Prescription Assistance:  no Nutrition/Diabetes Education:  no   Plan:   Personalized Goals Goals Addressed              This Visit's Progress   .  Patient Stated (pt-stated)        02/26/2021 AWV Goal: Fall Prevention  . Over the next year, patient will decrease their risk for falls by: o Using assistive devices, such as a cane or walker, as needed o Identifying fall risks within their home and correcting them by: - Removing throw rugs - Adding handrails to stairs or ramps - Removing clutter and keeping a clear pathway throughout the home - Increasing light, especially at night - Adding shower handles/bars - Raising toilet seat o Identifying potential personal risk factors for falls: - Medication side effects - Incontinence/urgency - Vestibular dysfunction - Hearing loss - Musculoskeletal disorders - Neurological disorders - Orthostatic hypotension        Personalized Health Maintenance & Screening Recommendations  Eye exam, Shingrix vaccine  Bone density and Mammogram due in  August, 2022.  Lung Cancer Screening Recommended: no (Low Dose CT Chest recommended if Age 8-80 years, 30 pack-year currently smoking OR have quit w/in past 15 years) Hepatitis C Screening recommended: no HIV Screening recommended: no  Advanced Directives: Written information was not prepared per patient's request.  Referrals & Orders Orders Placed This Encounter  Procedures  . DEXAScan  . Mammogram 3D SCREEN BREAST BILATERAL    Follow-up Plan . Follow-up with Hali Marry, MD as planned . Schedule your eye exam appointment. . Schedule your shingrix vaccine at your pharmacy.  . Referral for your bone density and mammogram has been sent.  . Medicare wellness in one year.   I have personally reviewed and noted the following in the patient's chart:   . Medical and social history . Use of alcohol, tobacco or illicit drugs  . Current medications and supplements . Functional ability and status . Nutritional status . Physical activity . Advanced  directives . List of other physicians . Hospitalizations, surgeries, and ER visits in previous 12 months . Vitals . Screenings to include cognitive, depression, and falls . Referrals and appointments  In addition, I have reviewed and discussed with Mia [name] certain preventive protocols, quality metrics, and best practice recommendations. A written personalized care plan for preventive services as well as general preventive health recommendations is available and can be mailed to the patient at her request.      Tinnie Gens, RN  02/26/2021

## 2021-02-26 NOTE — Patient Instructions (Addendum)
[place] Maintenance Summary and Written Plan of Care  Mia [name] ,  Thank you for allowing me to perform your Medicare Annual Wellness Visit and for your ongoing commitment to your health.   Health Maintenance & Immunization History Health Maintenance  Topic Date Due  . OPHTHALMOLOGY EXAM  02/26/2021 (Originally 04/10/2020)  . HEMOGLOBIN A1C  03/09/2021 (Originally 12/12/2020)  . FOOT EXAM  10/03/2021 (Originally 11/15/2019)  . COVID-19 Vaccine (4 - Booster for Moderna series) 04/04/2021  . INFLUENZA VACCINE  05/26/2021  . DEXA SCAN  06/06/2021  . MAMMOGRAM  06/12/2022  . COLONOSCOPY (Pts 45-37yrs Insurance coverage will need to be confirmed)  03/05/2027  . TETANUS/TDAP  05/12/2028  . Hepatitis C Screening  Completed  . PNA vac Low Risk Adult  Completed  . HPV VACCINES  Aged Out   Immunization History  Administered Date(s) Administered  . Fluad Quad(high Dose 65+) 07/13/2019, 07/10/2020  . Hepatitis B, adult 02/25/2021  . Influenza Split 07/26/2012, 06/11/2014, 07/25/2015, 06/23/2018  . Influenza Whole 07/30/2009, 08/19/2010  . Influenza, High Dose Seasonal PF 07/06/2016, 07/05/2017  . Influenza,inj,Quad PF,6+ Mos 06/11/2014, 07/25/2015, 06/23/2018  . Influenza-Unspecified 07/26/2013, 09/05/2013, 07/13/2019, 07/10/2020  . Moderna Sars-Covid-2 Vaccination 02/29/2020, 03/28/2020, 10/04/2020  . Pneumococcal Conjugate-13 01/04/2017  . Pneumococcal Polysaccharide-23 07/30/2009, 11/14/2018  . Pneumococcal-Unspecified 10/27/2015  . Tdap 02/13/2008, 05/12/2018  . Zoster 09/01/2011    These are the patient goals that we discussed: Goals Addressed              This Visit's Progress   .  Patient Stated (pt-stated)        02/26/2021 AWV Goal: Fall Prevention  . Over the next year, patient will decrease their risk for falls by: o Using assistive devices, such as a cane or walker, as needed o Identifying fall risks within their home and correcting them  by: - Removing throw rugs - Adding handrails to stairs or ramps - Removing clutter and keeping a clear pathway throughout the home - Increasing light, especially at night - Adding shower handles/bars - Raising toilet seat o Identifying potential personal risk factors for falls: - Medication side effects - Incontinence/urgency - Vestibular dysfunction - Hearing loss - Musculoskeletal disorders - Neurological disorders - Orthostatic hypotension          This is a list of Health Maintenance Items that are overdue or due now: Eye exam, Shingrix vaccine  Bone density and Mammogram due in August, 2022.  Orders/Referrals Placed Today: Orders Placed This Encounter  Procedures  . DEXAScan    Standing Status:   Future    Standing Expiration Date:   02/26/2022    Scheduling Instructions:     Please call patient to schedule. She will be due in August.    Order Specific Question:   Reason for exam:    Answer:   Post menopausal    Order Specific Question:   Preferred imaging location?    Answer:   [facility]  . Mammogram 3D SCREEN BREAST BILATERAL    Standing Status:   Future    Standing Expiration Date:   02/26/2022    Scheduling Instructions:     Please call patient to schedule. She will be due in August.    Order Specific Question:   Reason for Exam (SYMPTOM  OR DIAGNOSIS REQUIRED)    Answer:   breast cancer screening    Order Specific Question:   Preferred imaging location?    Answer:   [facility]   (  Contact our referral department at [phone] if you have not spoken with someone about your referral appointment within the next 5 days)    Follow-up Plan . Follow-up with Hali Marry, MD as planned . Schedule your eye exam appointment. . Schedule your shingrix vaccine at your pharmacy.  . Referral for your bone density and mammogram has been sent.  . Medicare wellness in one year.       Bone Density Test A bone density test uses a  type of X-ray to measure the amount of calcium and other minerals in a person's bones. It can measure bone density in the hip and the spine. The test is similar to having a regular X-ray. This test may also be called:  Bone densitometry.  Bone mineral density test.  Dual-energy X-ray absorptiometry (DEXA). You may have this test to:  Diagnose a condition that causes weak or thin bones (osteoporosis).  Screen you for osteoporosis.  Predict your risk for a broken bone (fracture).  Determine how well your osteoporosis treatment is working. Tell a health care provider about:  Any allergies you have.  All medicines you are taking, including vitamins, herbs, eye drops, creams, and over-the-counter medicines.  Any problems you or family members have had with anesthetic medicines.  Any blood disorders you have.  Any surgeries you have had.  Any medical conditions you have.  Whether you are pregnant or may be pregnant.  Any medical tests you have had within the past 14 days that used contrast material. What are the risks? Generally, this is a safe test. However, it does expose you to a small amount of radiation, which can slightly increase your cancer risk. What happens before the test?  Do not take any calcium supplements within the 24 hours before your test.  You will need to remove all metal jewelry, eyeglasses, removable dental appliances, and any other metal objects on your body. What happens during the test?  You will lie down on an exam table. There will be an X-ray generator below you and an imaging device above you.  Other devices, such as boxes or braces, may be used to position your body properly for the scan.  The machine will slowly scan your body. You will need to keep very still while the machine does the scan.  The images will show up on a screen in the room. Images will be examined by a specialist after your test is finished. The procedure may vary among  health care providers and hospitals.   What can I expect after the test? It is up to you to get the results of your test. Ask your health care provider, or the department that is doing the test, when your results will be ready. Summary  A bone density test is an imaging test that uses a type of X-ray to measure the amount of calcium and other minerals in your bones.  The test may be used to diagnose or screen you for a condition that causes weak or thin bones (osteoporosis), predict your risk for a broken bone (fracture), or determine how well your osteoporosis treatment is working.  Do not take any calcium supplements within 24 hours before your test.  Ask your health care provider, or the department that is doing the test, when your results will be ready. This information is not intended to replace advice given to you by your health care provider. Make sure you discuss any questions you have with your health care provider.  Document Revised: 03/28/2020 Document Reviewed: 03/28/2020 Elsevier Patient Education  2021 [place] Prevention in the Home, Adult Falls can cause injuries and can happen to people of all ages. There are many things you can do to make your home safe and to help prevent falls. Ask for help when making these changes. What actions can I take to prevent falls? General Instructions  Use good lighting in all rooms. Replace any light bulbs that burn out.  Turn on the lights in dark areas. Use night-lights.  Keep items that you use often in easy-to-reach places. Lower the shelves around your home if needed.  Set up your furniture so you have a clear path. Avoid moving your furniture around.  Do not have throw rugs or other things on the floor that can make you trip.  Avoid walking on wet floors.  If any of your floors are uneven, fix them.  Add color or contrast paint or tape to clearly mark and help you see: ? Grab bars or handrails. ? First and last  steps of staircases. ? Where the edge of each step is.  If you use a stepladder: ? Make sure that it is fully opened. Do not climb a closed stepladder. ? Make sure the sides of the stepladder are locked in place. ? Ask someone to hold the stepladder while you use it.  Know where your pets are when moving through your home. What can I do in the bathroom?  Keep the floor dry. Clean up any water on the floor right away.  Remove soap buildup in the tub or shower.  Use nonskid mats or decals on the floor of the tub or shower.  Attach bath mats securely with double-sided, nonslip rug tape.  If you need to sit down in the shower, use a plastic, nonslip stool.  Install grab bars by the toilet and in the tub and shower. Do not use towel bars as grab bars.      What can I do in the bedroom?  Make sure that you have a light by your bed that is easy to reach.  Do not use any sheets or blankets for your bed that hang to the floor.  Have a firm chair with side arms that you can use for support when you get dressed. What can I do in the kitchen?  Clean up any spills right away.  If you need to reach something above you, use a step stool with a grab bar.  Keep electrical cords out of the way.  Do not use floor polish or wax that makes floors slippery. What can I do with my stairs?  Do not leave any items on the stairs.  Make sure that you have a light switch at the top and the bottom of the stairs.  Make sure that there are handrails on both sides of the stairs. Fix handrails that are broken or loose.  Install nonslip stair treads on all your stairs.  Avoid having throw rugs at the top or bottom of the stairs.  Choose a carpet that does not hide the edge of the steps on the stairs.  Check carpeting to make sure that it is firmly attached to the stairs. Fix carpet that is loose or worn. What can I do on the outside of my home?  Use bright outdoor lighting.  Fix the edges of  walkways and driveways and fix any cracks.  Remove anything that might make you trip  as you walk through a door, such as a raised step or threshold.  Trim any bushes or trees on paths to your home.  Check to see if handrails are loose or broken and that both sides of all steps have handrails.  Install guardrails along the edges of any raised decks and porches.  Clear paths of anything that can make you trip, such as tools or rocks.  Have leaves, snow, or ice cleared regularly.  Use sand or salt on paths during winter.  Clean up any spills in your garage right away. This includes grease or oil spills. What other actions can I take?  Wear shoes that: ? Have a low heel. Do not wear high heels. ? Have rubber bottoms. ? Feel good on your feet and fit well. ? Are closed at the toe. Do not wear open-toe sandals.  Use tools that help you move around if needed. These include: ? Canes. ? Walkers. ? Scooters. ? Crutches.  Review your medicines with your doctor. Some medicines can make you feel dizzy. This can increase your chance of falling. Ask your doctor what else you can do to help prevent falls. Where to find more information  Centers for Disease Control and Prevention, [employer]: http://www.wolf.info/  National Institute on Aging: http://kim-miller.com/ Contact a doctor if:  You are afraid of falling at home.  You feel weak, drowsy, or dizzy at home.  You fall at home. Summary  There are many simple things that you can do to make your home safe and to help prevent falls.  Ways to make your home safe include removing things that can make you trip and installing grab bars in the bathroom.  Ask for help when making these changes in your home. This information is not intended to replace advice given to you by your health care provider. Make sure you discuss any questions you have with your health care provider. Document Revised: 05/15/2020 Document Reviewed: 05/15/2020 Elsevier Patient  Education  [place] Maintenance, Female Adopting a healthy lifestyle and getting preventive care are important in promoting health and wellness. Ask your health care provider about:  The right schedule for you to have regular tests and exams.  Things you can do on your own to prevent diseases and keep yourself healthy. What should I know about diet, weight, and exercise? Eat a healthy diet  Eat a diet that includes plenty of vegetables, fruits, low-fat dairy products, and lean protein.  Do not eat a lot of foods that are high in solid fats, added sugars, or sodium.   Maintain a healthy weight Body mass index (BMI) is used to identify weight problems. It estimates body fat based on height and weight. Your health care provider can help determine your BMI and help you achieve or maintain a healthy weight. Get regular exercise Get regular exercise. This is one of the most important things you can do for your health. Most adults should:  Exercise for at least 150 minutes each week. The exercise should increase your heart rate and make you sweat (moderate-intensity exercise).  Do strengthening exercises at least twice a week. This is in addition to the moderate-intensity exercise.  Spend less time sitting. Even light physical activity can be beneficial. Watch cholesterol and blood lipids Have your blood tested for lipids and cholesterol at 70 years of age, then have this test every 5 years. Have your cholesterol levels checked more often if:  Your lipid or cholesterol levels are  high.  You are older than 70 years of age.  You are at high risk for heart disease. What should I know about cancer screening? Depending on your health history and family history, you may need to have cancer screening at various ages. This may include screening for:  Breast cancer.  Cervical cancer.  Colorectal cancer.  Skin cancer.  Lung cancer. What should I know about heart  disease, diabetes, and high blood pressure? Blood pressure and heart disease  High blood pressure causes heart disease and increases the risk of stroke. This is more likely to develop in people who have high blood pressure readings, are of African descent, or are overweight.  Have your blood pressure checked: ? Every 3-5 years if you are 90-6 years of age. ? Every year if you are 69 years old or older. Diabetes Have regular diabetes screenings. This checks your fasting blood sugar level. Have the screening done:  Once every three years after age 6 if you are at a normal weight and have a low risk for diabetes.  More often and at a younger age if you are overweight or have a high risk for diabetes. What should I know about preventing infection? Hepatitis B If you have a higher risk for hepatitis B, you should be screened for this virus. Talk with your health care provider to find out if you are at risk for hepatitis B infection. Hepatitis C Testing is recommended for:  Everyone born from [birth year] through 1965.  Anyone with known risk factors for hepatitis C. Sexually transmitted infections (STIs)  Get screened for STIs, including gonorrhea and chlamydia, if: ? You are sexually active and are younger than 70 years of age. ? You are older than 70 years of age and your health care provider tells you that you are at risk for this type of infection. ? Your sexual activity has changed since you were last screened, and you are at increased risk for chlamydia or gonorrhea. Ask your health care provider if you are at risk.  Ask your health care provider about whether you are at high risk for HIV. Your health care provider may recommend a prescription medicine to help prevent HIV infection. If you choose to take medicine to prevent HIV, you should first get tested for HIV. You should then be tested every 3 months for as long as you are taking the medicine. Pregnancy  If you are about to stop  having your period (premenopausal) and you may become pregnant, seek counseling before you get pregnant.  Take 400 to 800 micrograms (mcg) of folic acid every day if you become pregnant.  Ask for birth control (contraception) if you want to prevent pregnancy. Osteoporosis and menopause Osteoporosis is a disease in which the bones lose minerals and strength with aging. This can result in bone fractures. If you are 58 years old or older, or if you are at risk for osteoporosis and fractures, ask your health care provider if you should:  Be screened for bone loss.  Take a calcium or vitamin D supplement to lower your risk of fractures.  Be given hormone replacement therapy (HRT) to treat symptoms of menopause. Follow these instructions at home: Lifestyle  Do not use any products that contain nicotine or tobacco, such as cigarettes, e-cigarettes, and chewing tobacco. If you need help quitting, ask your health care provider.  Do not use street drugs.  Do not share needles.  Ask your health care provider for help if you  need support or information about quitting drugs. Alcohol use  Do not drink alcohol if: ? Your health care provider tells you not to drink. ? You are pregnant, may be pregnant, or are planning to become pregnant.  If you drink alcohol: ? Limit how much you use to 0-1 drink a day. ? Limit intake if you are breastfeeding.  Be aware of how much alcohol is in your drink. In the [place] one drink equals one 12 oz bottle of beer (355 mL), one 5 oz glass of wine (148 mL), or one 1 oz glass of hard liquor (44 mL). General instructions  Schedule regular health, dental, and eye exams.  Stay current with your vaccines.  Tell your health care provider if: ? You often feel depressed. ? You have ever been abused or do not feel safe at home. Summary  Adopting a healthy lifestyle and getting preventive care are important in promoting health and wellness.  Follow your health  care provider's instructions about healthy diet, exercising, and getting tested or screened for diseases.  Follow your health care provider's instructions on monitoring your cholesterol and blood pressure. This information is not intended to replace advice given to you by your health care provider. Make sure you discuss any questions you have with your health care provider. Document Revised: 10/05/2018 Document Reviewed: 10/05/2018 Elsevier Patient Education  2021 [place] A mammogram is a low energy X-ray of the breasts that is done to check for abnormal changes. This procedure can screen for and detect any changes that may indicate breast cancer. Mammograms are regularly done on women. A man may have a mammogram if he has a lump or swelling in his breast. A mammogram can also identify other changes and variations in the breast, such as:  Inflammation of the breast tissue (mastitis).  An infected area that contains a collection of pus (abscess).  A fluid-filled sac (cyst).  Fibrocystic changes. This is when breast tissue becomes denser, which can make the tissue feel rope-like or uneven under the skin.  Tumors that are not cancerous (benign). Tell a health care provider:  About any allergies you have.  If you have breast implants.  If you have had previous breast disease, biopsy, or surgery.  If you are breastfeeding.  If you are younger than age 40.  If you have a family history of breast cancer.  Whether you are pregnant or may be pregnant. What are the risks? Generally, this is a safe procedure. However, problems may occur, including:  Exposure to radiation. Radiation levels are very low with this test.  The results being misinterpreted.  The need for further tests.  The inability of the mammogram to detect certain cancers. What happens before the procedure?  Schedule your test about 1-2 weeks after your menstrual period if you are still  menstruating. This is usually when your breasts are the least tender.  If you have had a mammogram done at a different facility in the past, get the mammogram X-rays or have them sent to your current exam facility. The new and old images will be compared.  Wash your breasts and underarms on the day of the test.  Do not wear deodorants, perfumes, lotions, or powders anywhere on your body on the day of the test.  Remove any jewelry from your neck.  Wear clothes that you can change into and out of easily. What happens during the procedure?  You will undress from the waist up and  put on a gown that opens in the front.  You will stand in front of the X-ray machine.  Each breast will be placed between two plastic or glass plates. The plates will compress your breast for a few seconds. Try to stay as relaxed as possible during the procedure. This does not cause any harm to your breasts and any discomfort you feel will be very brief.  X-rays will be taken from different angles of each breast. The procedure may vary among health care providers and hospitals.   What happens after the procedure?  The mammogram will be examined by a specialist (radiologist).  You may need to repeat certain parts of the test, depending on the quality of the images. This is commonly done if the radiologist needs a better view of the breast tissue.  You may resume your normal activities.  It is up to you to get the results of your procedure. Ask your health care provider, or the department that is doing the procedure, when your results will be ready. Summary  A mammogram is a low energy X-ray of the breasts that is done to check for abnormal changes. A man may have a mammogram if he has a lump or swelling in his breast.  If you have had a mammogram done at a different facility in the past, get the mammogram X-rays or have them sent to your current exam facility in order to compare them.  Schedule your test about  1-2 weeks after your menstrual period if you are still menstruating.  For this test, each breast will be placed between two plastic or glass plates. The plates will compress your breast for a few seconds.  Ask when your test results will be ready. Make sure you get your test results. This information is not intended to replace advice given to you by your health care provider. Make sure you discuss any questions you have with your health care provider. Document Revised: 06/02/2018 Document Reviewed: 06/02/2018 Elsevier Patient Education  [place].

## 2021-02-26 NOTE — Telephone Encounter (Signed)
Mia [name] called and wanted to know if we had the Hepatitis A. Is it ok to schedule a nurse visit for Hepatitis A vaccine.  She also states her GI doctor is not going to do a liver biopsy because her liver enzymes continue to be elevated. She wanted to know Dr Gardiner Ramus thoughts.

## 2021-02-27 NOTE — Telephone Encounter (Signed)
Yes, okay to start hepatitis A series.  In regards to the liver biopsy I would need to go back and look at the notes.  Maybe we can discuss further at her next office visit.

## 2021-02-27 NOTE — Telephone Encounter (Signed)
Patient advised of recommendations. She states she will call back to schedule a nurse visit.

## 2021-03-03 DIAGNOSIS — M25512 Pain in left shoulder: Secondary | ICD-10-CM | POA: Diagnosis not present

## 2021-03-04 ENCOUNTER — Encounter: Payer: Self-pay | Admitting: Adult Health

## 2021-03-04 ENCOUNTER — Ambulatory Visit (INDEPENDENT_AMBULATORY_CARE_PROVIDER_SITE_OTHER): Payer: Medicare Other | Admitting: Adult Health

## 2021-03-04 VITALS — BP 116/67 | HR 61 | Ht 66.0 in | Wt 170.0 lb

## 2021-03-04 DIAGNOSIS — G250 Essential tremor: Secondary | ICD-10-CM

## 2021-03-04 DIAGNOSIS — R413 Other amnesia: Secondary | ICD-10-CM | POA: Diagnosis not present

## 2021-03-04 DIAGNOSIS — M797 Fibromyalgia: Secondary | ICD-10-CM | POA: Diagnosis not present

## 2021-03-04 NOTE — Progress Notes (Signed)
I have read the note, and I agree with the clinical assessment and plan.  Nicolaos Mitrano K Adisa Vigeant   

## 2021-03-04 NOTE — Progress Notes (Signed)
PATIENT: Mia [name] DOB: 01/12/1951  REASON FOR VISIT: follow up HISTORY FROM: patient  HISTORY OF PRESENT ILLNESS: Today 03/04/21:  Mia [name] is a 70 year old female with a history of memory disturbance, tremor and fibromyalgia.  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  The patient reports today that years ago when she was at [facility] this was also mentioned as a diagnosis.  She has had normal nerve conduction with EMGs in the past.  In regards to her tremor she feels that Mysoline continues to work fairly well for her.    Fibromyalgia: Continues on Effexor feels that this works well for her.  In regards to her memory she feels that it has gotten worse since having Covid in January.  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  She is able to complete all ADLs independently.  She has noticed some change in her balance.  States that she recently had a fall and injured her elbow.  Feels that her tremor has been well controlled with Mysoline.  Primarily just affects the hands  Reports that fibromyalgia continues to be fairly controlled with Effexor.  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  She returns today for an evaluation.  HISTORY Mia [name] is a 70 year old right-handed white female with a history of a mild memory disturbance, she is on Aricept and Namenda.  She contracted the Covid virus and required hospitalization on 06 November 2019.  The patient has had some increase in fatigue and some increased cognitive slowing since that time.  She has had ongoing pain in the neck and low back, she is followed through an orthopedic surgeon.  She had a recent MRI of the cervical and lumbar spine done through [facility] in [place].  The results of the studies are not available to me.  The  patient does report some gait instability, she has not had any recent falls.  She does not use a cane.  She returns the office today for an evaluation.  She is on very low-dose Mysoline taking 50 mg at night.  REVIEW OF SYSTEMS: Out of a complete 14 system review of symptoms, the patient complains only of the following symptoms, and all other reviewed systems are negative.  See HPI  ALLERGIES: Allergies  Allergen Reactions  . Codeine Nausea And Vomiting  . Myrbetriq  [Mirabegron] Other (See Comments)    SEVERE HEADACHE  . Percocet [Oxycodone-Acetaminophen] Nausea And Vomiting  . Propoxyphene Nausea And Vomiting  . Nystatin   . Celebrex [Celecoxib] Other (See Comments)    Other reaction(s): Other flushed  . Darvocet [Propoxyphene N-Acetaminophen] Nausea And Vomiting  . Erythromycin Nausea And Vomiting    Nausea and vomiting   . Hydrocodone Nausea And Vomiting  . Lyrica [Pregabalin] Swelling    Legs swelling   . Macrodantin [Nitrofurantoin] Nausea And Vomiting  . Toradol [Ketorolac Tromethamine] Nausea And Vomiting    Per patient, only PO form causes nausea and vomiting. Can take injection without issue  . Tramadol Nausea And Vomiting  . Verapamil Nausea And Vomiting    REACTION: intolerance    HOME MEDICATIONS: Outpatient Medications Prior to Visit  Medication Sig Dispense Refill  . amLODipine (NORVASC) 5 MG tablet Take 1 tablet (5 mg total) by mouth daily. 90 tablet 3  .  colestipol (COLESTID) 1 g tablet Take 1 g by mouth 2 (two) times daily.    . Cyanocobalamin (VITAMIN B-12 IJ) Inject 1 mL as directed every 30 (thirty) days.     Marland Kitchen denosumab (PROLIA) 60 MG/ML SOLN injection Inject 60 mg into the skin every 6 (six) months. Administer in upper arm, thigh, or abdomen    . donepezil (ARICEPT) 10 MG tablet TAKE 1 TABLET AT BEDTIME 90 tablet 3  . gabapentin (NEURONTIN) 300 MG capsule TAKE 1 TO 2 CAPSULES BY MOUTH UP TO TWO TIMES DAILY AS NEEDED 180 capsule 3  . ibuprofen (ADVIL)  600 MG tablet ibuprofen 600 mg tablet  TAKE 1 TABLET BY MOUTH EVERY 6 HOURS AS NEEDED    . ipratropium (ATROVENT) 0.03 % nasal spray Place 2 sprays into both nostrils every 12 (twelve) hours. 30 mL 1  . leflunomide (ARAVA) 20 MG tablet Take 1 tablet (20 mg total) by mouth daily. 30 tablet 2  . losartan (COZAAR) 50 MG tablet Take 2 tablets (100 mg total) by mouth daily. Take 1 tablet in the morning and 1 tablet in evening as needed for blood pressure greater than 150 180 tablet 3  . Melatonin 10 MG CAPS Take 10 capsules by mouth daily.    . memantine (NAMENDA) 10 MG tablet TAKE 1 TABLET TWICE DAILY 180 tablet 4  . metFORMIN (GLUCOPHAGE-XR) 500 MG 24 hr tablet TAKE 2 TABLETS EVERY DAY WITH BREAKFAST (Patient taking differently: Take 500 mg by mouth daily with breakfast.) 180 tablet 1  . metoprolol tartrate (LOPRESSOR) 100 MG tablet TAKE 1 AND 1/2 TABLETS TWICE DAILY 270 tablet 3  . ondansetron (ZOFRAN-ODT) 8 MG disintegrating tablet Take 1 tablet by mouth as needed for nausea.    . pantoprazole (PROTONIX) 40 MG tablet Take 40 mg by mouth 2 (two) times daily.    . polyethylene glycol powder (GLYCOLAX/MIRALAX) 17 GM/SCOOP powder Take 17 g by mouth daily as needed for mild constipation or moderate constipation.     . primidone (MYSOLINE) 50 MG tablet TAKE 1 TABLET AT BEDTIME 90 tablet 1  . venlafaxine XR (EFFEXOR-XR) 75 MG 24 hr capsule TAKE 1 CAPSULE EVERY DAY WITH BREAKFAST 90 capsule 3  . meloxicam (MOBIC) 15 MG tablet Take 15 mg by mouth daily. (Patient not taking: Reported on 02/26/2021)     No facility-administered medications prior to visit.    PAST MEDICAL HISTORY: Past Medical History:  Diagnosis Date  . Anal fissure   . Anemia   . Arthritis   . Blood transfusion   . C. difficile colitis   . Chest pain    a. 01/2013 MV: EF 59%, no ischemia.  . Chronic Dyspnea    a. 01/2013 Echo: EF 60-65%, Gr 1 DD, PASP 104mmHg.// Echo 01/2020: EF 60-65, no RWMA, GR 1 DD, GLS -21.6%, normal RV SF, trivial  MR   . COVID-19   . DDD (degenerative disc disease), cervical   . DDD (degenerative disc disease), lumbar   . Diabetes mellitus   . Fibromyalgia   . Gall stones   . GERD (gastroesophageal reflux disease)    gastritis  . Hemorrhoids   . Hypertension   . Interstitial cystitis   . MCI (mild cognitive impairment) 11/25/2017  . Memory difficulty 12/14/2013  . Neuromuscular disorder ([place])    sclerosis  . Osteoporosis   . Peptic ulcer   . PONV (postoperative nausea and vomiting)   . Pseudogout   . Raynaud phenomenon   . Rectal bleeding   .  Sleep apnea    a. on cpap.  Marland Kitchen SVT (supraventricular tachycardia) ([place])   . Syncope 09/10/2015  . Thyroid disease    hypothyroidism  . Tremor, essential 05/14/2016    PAST SURGICAL HISTORY: Past Surgical History:  Procedure Laterality Date  . APPENDECTOMY    . BLADDER SURGERY     x2  . BLADDER SURGERY    . BREAST BIOPSY    . CARDIAC CATHETERIZATION    . CARPAL TUNNEL RELEASE    . CATARACT EXTRACTION Bilateral   . CHOLECYSTECTOMY    . DILATION AND CURETTAGE OF UTERUS    . ENTEROCELE REPAIR     x2  . EYE SURGERY     retina  . hysterectomy - unknown type    . JOINT REPLACEMENT    . KNEE ARTHROPLASTY    . KNEE SURGERY     x6  . NECK SURGERY     fusion  . OOPHORECTOMY    . QUADRICEPS REPAIR Right   . RECTOCELE REPAIR     x2  . SHOULDER ARTHROSCOPY WITH SUBACROMIAL DECOMPRESSION, ROTATOR CUFF REPAIR AND BICEP TENDON REPAIR  10/06/2012   Procedure: SHOULDER ARTHROSCOPY WITH SUBACROMIAL DECOMPRESSION, ROTATOR CUFF REPAIR AND BICEP TENDON REPAIR;  Surgeon: Nita Sells, MD;  Location: [facility];  Service: Orthopedics;  Laterality: Right;  Arthroscopic  Repair  of  Subscapularis, Open Biceps Tenodesis  . SHOULDER SURGERY     bilateral- bones spur  . TONSILLECTOMY    . TOTAL KNEE ARTHROPLASTY    . TOTAL SHOULDER ARTHROPLASTY      FAMILY HISTORY: Family History  Problem Relation Age of Onset  . Heart attack  Mother   . Stroke Mother   . Diabetes Mother   . Heart disease Mother   . Colon polyps Mother   . Asthma Mother   . Breast cancer Maternal Grandmother   . Wilson's disease Maternal Grandmother   . Pancreatic cancer Maternal Grandfather   . Heart disease Father   . Heart attack Father   . Asthma Father   . Stroke Sister   . Hypertension Sister   . Rheum arthritis Sister   . Dementia Sister   . Asthma Sister   . Lupus Sister   . Asthma Sister     SOCIAL HISTORY: Social History   Socioeconomic History  . Marital status: Married    Spouse name: Clarnce Flock"  . Number of children: 2  . Years of education: 82  . Highest education level: Associate degree: academic program  Occupational History  . Occupation: Retired  Tobacco Use  . Smoking status: Never Smoker  . Smokeless tobacco: Never Used  Vaping Use  . Vaping Use: Never used  Substance and Sexual Activity  . Alcohol use: No    Alcohol/week: 0.0 standard drinks  . Drug use: No  . Sexual activity: Not on file  Other Topics Concern  . Not on file  Social History Narrative   Lives with her husband. She likes to hang out with her friends and play games and do bible study.   Social Determinants of Health   Financial Resource Strain: Low Risk   . Difficulty of Paying Living Expenses: Not hard at all  Food Insecurity: No Food Insecurity  . Worried About Charity fundraiser in the Last Year: Never true  . Ran Out of Food in the Last Year: Never true  Transportation Needs: No Transportation Needs  . Lack of Transportation (Medical): No  .  Lack of Transportation (Non-Medical): No  Physical Activity: Inactive  . Days of Exercise per Week: 0 days  . Minutes of Exercise per Session: 0 min  Stress: No Stress Concern Present  . Feeling of Stress : Not at all  Social Connections: Socially Integrated  . Frequency of Communication with Friends and Family: More than three times a week  . Frequency of Social Gatherings with  Friends and Family: More than three times a week  . Attends Religious Services: More than 4 times per year  . Active Member of Clubs or Organizations: Yes  . Attends Archivist Meetings: More than 4 times per year  . Marital Status: Married  Human resources officer Violence: Not At Risk  . Fear of Current or Ex-Partner: No  . Emotionally Abused: No  . Physically Abused: No  . Sexually Abused: No      PHYSICAL EXAM  Vitals:   03/04/21 1020  BP: 116/67  Pulse: 61  Weight: 170 lb (77.1 kg)  Height: 5\' 6"  (1.676 m)   Body mass index is 27.44 kg/m.   MMSE - Mini Mental State Exam 03/04/2021 08/28/2020 12/27/2019  Orientation to time 2 3 4   Orientation to Place 5 5 5   Registration 3 3 3   Attention/ Calculation 5 1 5   Recall 2 3 1   Language- name 2 objects 2 2 2   Language- repeat 1 1 1   Language- follow 3 step command 3 3 3   Language- read & follow direction 1 1 1   Write a sentence 1 1 1   Copy design 1 1 1   Copy design-comments - - -  Total score 26 24 27      Generalized: Well developed, in no acute distress   Neurological examination  Mentation: Alert oriented to time, place, history taking. Follows all commands speech and language fluent Cranial nerve II-XII: Pupils were equal round reactive to light. Extraocular movements were full, visual field were full on confrontational test. Facial sensation and strength were normal. Uvula tongue midline. Head turning and shoulder shrug  were normal and symmetric. Motor: The motor testing reveals 5 over 5 strength.  The patient appears to have giveaway weakness noted in all extremities but more pronounced on the left upper extremity in the lower extremities.  Good symmetric motor tone is noted throughout.  Sensory: Sensory testing is intact to soft touch on all 4 extremities. No evidence of extinction is noted.  Coordination: Cerebellar testing reveals good finger-nose-finger and heel-to-shin bilaterally.  Gait and station: Gait is  normal.  Patient began staggering before putting her feet together.  She is unable to perform Romberg.  Tandem gait not attempted.   Reflexes: Deep tendon reflexes are symmetric and normal bilaterally.   DIAGNOSTIC DATA (LABS, IMAGING, TESTING) - I reviewed patient records, labs, notes, testing and imaging myself where available.  Lab Results  Component Value Date   WBC 7.7 12/09/2020   HGB 12.0 12/09/2020   HCT 37.2 12/09/2020   MCV 83.8 12/09/2020   PLT 394 12/09/2020      Component Value Date/Time   NA 135 02/12/2021 1022   NA 138 01/23/2020 1602   K 4.4 02/12/2021 1022   CL 101 02/12/2021 1022   CO2 27 02/12/2021 1022   GLUCOSE 111 02/12/2021 1022   BUN 12 02/12/2021 1022   BUN 9 01/23/2020 1602   CREATININE 0.73 02/12/2021 1022   CALCIUM 9.5 02/12/2021 1022   PROT 6.4 02/12/2021 1022   PROT 5.9 (L) 06/07/2019 8756  ALBUMIN 4.0 05/21/2020 0819   ALBUMIN 3.9 06/07/2019 0934   AST 372 (H) 02/12/2021 1022   ALT 234 (H) 02/12/2021 1022   ALKPHOS 73 05/21/2020 0819   BILITOT 1.3 (H) 02/12/2021 1022   BILITOT 0.3 06/07/2019 0934   GFRNONAA 84 02/12/2021 1022   GFRAA 97 02/12/2021 1022   Lab Results  Component Value Date   CHOL 128 06/07/2019   HDL 52 06/07/2019   LDLCALC 51 06/07/2019   TRIG 97 11/07/2019   CHOLHDL 2.5 06/07/2019   Lab Results  Component Value Date   HGBA1C 6.0 (A) 06/11/2020   Lab Results  Component Value Date   VITAMINB12 322 11/18/2020   Lab Results  Component Value Date   TSH 1.34 05/21/2020      ASSESSMENT AND PLAN 70 y.o. year old female  has a past medical history of Anal fissure, Anemia, Arthritis, Blood transfusion, C. difficile colitis, Chest pain, Chronic Dyspnea, COVID-19, DDD (degenerative disc disease), cervical, DDD (degenerative disc disease), lumbar, Diabetes mellitus, Fibromyalgia, Gall stones, GERD (gastroesophageal reflux disease), Hemorrhoids, Hypertension, Interstitial cystitis, MCI (mild cognitive impairment)  (11/25/2017), Memory difficulty (12/14/2013), Neuromuscular disorder ([place]), Osteoporosis, Peptic ulcer, PONV (postoperative nausea and vomiting), Pseudogout, Raynaud phenomenon, Rectal bleeding, Sleep apnea, SVT (supraventricular tachycardia) ([place]), Syncope (09/10/2015), Thyroid disease, and Tremor, essential (05/14/2016). here with :  1. Tremor  - Continue Mysoline 50 mg daily at bedtime  2.  Fibromyalgia   -Continue Effexor 75 mg daily  3.  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  I advised the patient that I would discuss this with [name] and we will be in touch.   Follow-up in 6 months or sooner if needed    Crossley Givens, MSN, NP-C 03/04/2021, 10:42 AM [facility] [address], [place], [place] [address] [phone]

## 2021-03-06 DIAGNOSIS — R945 Abnormal results of liver function studies: Secondary | ICD-10-CM | POA: Diagnosis not present

## 2021-03-06 DIAGNOSIS — R7989 Other specified abnormal findings of blood chemistry: Secondary | ICD-10-CM | POA: Diagnosis not present

## 2021-03-06 DIAGNOSIS — K7689 Other specified diseases of liver: Secondary | ICD-10-CM | POA: Diagnosis not present

## 2021-03-10 ENCOUNTER — Other Ambulatory Visit: Payer: Self-pay | Admitting: *Deleted

## 2021-03-10 DIAGNOSIS — M0609 Rheumatoid arthritis without rheumatoid factor, multiple sites: Secondary | ICD-10-CM

## 2021-03-10 DIAGNOSIS — Z79899 Other long term (current) drug therapy: Secondary | ICD-10-CM | POA: Diagnosis not present

## 2021-03-10 DIAGNOSIS — I7300 Raynaud's syndrome without gangrene: Secondary | ICD-10-CM

## 2021-03-11 ENCOUNTER — Ambulatory Visit: Payer: Medicare Other | Admitting: Family Medicine

## 2021-03-11 LAB — COMPLETE METABOLIC PANEL WITH GFR
AG Ratio: 1.6 (calc) (ref 1.0–2.5)
ALT: 21 U/L (ref 6–29)
AST: 22 U/L (ref 10–35)
Albumin: 3.9 g/dL (ref 3.6–5.1)
Alkaline phosphatase (APISO): 102 U/L (ref 37–153)
BUN: 9 mg/dL (ref 7–25)
CO2: 27 mmol/L (ref 20–32)
Calcium: 10 mg/dL (ref 8.6–10.4)
Chloride: 101 mmol/L (ref 98–110)
Creat: 0.75 mg/dL (ref 0.50–0.99)
GFR, Est African American: 94 mL/min/{1.73_m2} (ref >=60)
GFR, Est Non African American: 81 mL/min/{1.73_m2} (ref >=60)
Globulin: 2.4 g/dL (calc) (ref 1.9–3.7)
Glucose, Bld: 100 mg/dL — ABNORMAL HIGH (ref 65–99)
Potassium: 4.4 mmol/L (ref 3.5–5.3)
Sodium: 138 mmol/L (ref 135–146)
Total Bilirubin: 0.2 mg/dL (ref 0.2–1.2)
Total Protein: 6.3 g/dL (ref 6.1–8.1)

## 2021-03-11 NOTE — Progress Notes (Signed)
CMP is normal.

## 2021-03-12 ENCOUNTER — Telehealth: Payer: Self-pay | Admitting: *Deleted

## 2021-03-12 NOTE — Telephone Encounter (Signed)
-----   Message from Weimer Givens, NP sent at 03/10/2021 11:38 AM EDT ----- Please call the patient and let them know that [name] does not feel that PLS is her diagnosis for the following reasons. We will continue to monitor the memory and her symptoms. FU in 6 months  Megan  ----- Message ----- From: Kathrynn Ducking, MD Sent: 03/04/2021   4:01 PM EDT To: Stiggers Givens, NP  I think PLS is not exactly what I would be thinking in regards to her clinical picture.  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  Individuals with primary lateral sclerosis have progression over 10 to 15 years and are quite disabled usually after 5 to 7 years.  These individuals have severe spasticity which is not the case here.  I probably would not put a lot of weight on the primary lateral sclerosis diagnosis. ----- Message ----- From: Zhan Givens, NP Sent: 03/04/2021   3:31 PM EDT To: Kathrynn Ducking, MD  Please review: this is the patient I was discussing with you early regarding PLS

## 2021-03-13 DIAGNOSIS — S46012A Strain of muscle(s) and tendon(s) of the rotator cuff of left shoulder, initial encounter: Secondary | ICD-10-CM | POA: Diagnosis not present

## 2021-03-13 DIAGNOSIS — M25512 Pain in left shoulder: Secondary | ICD-10-CM | POA: Diagnosis not present

## 2021-03-13 DIAGNOSIS — S43432A Superior glenoid labrum lesion of left shoulder, initial encounter: Secondary | ICD-10-CM | POA: Diagnosis not present

## 2021-03-18 ENCOUNTER — Ambulatory Visit (INDEPENDENT_AMBULATORY_CARE_PROVIDER_SITE_OTHER): Payer: Medicare Other | Admitting: Family Medicine

## 2021-03-18 ENCOUNTER — Encounter: Payer: Self-pay | Admitting: Family Medicine

## 2021-03-18 ENCOUNTER — Other Ambulatory Visit: Payer: Self-pay

## 2021-03-18 VITALS — BP 135/61 | HR 81 | Ht 66.0 in | Wt 165.4 lb

## 2021-03-18 DIAGNOSIS — F5089 Other specified eating disorder: Secondary | ICD-10-CM

## 2021-03-18 DIAGNOSIS — L989 Disorder of the skin and subcutaneous tissue, unspecified: Secondary | ICD-10-CM

## 2021-03-18 DIAGNOSIS — E538 Deficiency of other specified B group vitamins: Secondary | ICD-10-CM

## 2021-03-18 DIAGNOSIS — M810 Age-related osteoporosis without current pathological fracture: Secondary | ICD-10-CM

## 2021-03-18 DIAGNOSIS — R7301 Impaired fasting glucose: Secondary | ICD-10-CM

## 2021-03-18 DIAGNOSIS — Z780 Asymptomatic menopausal state: Secondary | ICD-10-CM | POA: Diagnosis not present

## 2021-03-18 DIAGNOSIS — L821 Other seborrheic keratosis: Secondary | ICD-10-CM

## 2021-03-18 LAB — POCT GLYCOSYLATED HEMOGLOBIN (HGB A1C): Hemoglobin A1C: 5.5 % (ref 4.0–5.6)

## 2021-03-18 MED ORDER — DENOSUMAB 60 MG/ML ~~LOC~~ SOSY
60.0000 mg | PREFILLED_SYRINGE | Freq: Once | SUBCUTANEOUS | Status: AC
  Administered 2021-03-18: 60 mg via SUBCUTANEOUS

## 2021-03-18 MED ORDER — CYANOCOBALAMIN 1000 MCG/ML IJ SOLN
1000.0000 ug | Freq: Once | INTRAMUSCULAR | Status: AC
  Administered 2021-03-18: 1000 ug via INTRAMUSCULAR

## 2021-03-18 NOTE — Progress Notes (Signed)
Pt came in today for B12 injection. Injection given in [facility] and  tolerated well.  Pt reports no negative side effects from medication. Denies any dizziness, chest pain or palpitations, and no GI problems. Pt to RTC in 3 weeks for next injection.   Prolia injection given for osteoporosis.  She is doing well with the injections this is her 66-month. Labs are up-to-date including calcium and creatinine. Injection given in RD.

## 2021-03-18 NOTE — Patient Instructions (Addendum)
Okay to bump up B12 injections to every 3 weeks. Okay to stop your metformin.

## 2021-03-18 NOTE — Progress Notes (Signed)
Established Patient Office Visit  Subjective:  Patient ID: Mia [name], female    DOB: 1951-05-22  Age: 70 y.o. MRN: [id number]  CC:  Chief Complaint  Patient presents with  . ifg  . B12 Injection  . prolia    HPI Mia [name] presents for   Impaired fasting glucose-no increased thirst or urination. No symptoms consistent with hypoglycemia.  Reports that she has been eating a little bit more healthy.  B12 def -due for B12 injection today she gets them monthly.  Last B12 check was earlier this year  Osteoporosis-due for Prolia today.  Recent CMP and CBC were normal.  Recently had a significant elevation in liver enzymes consistent with hepatitis.  She did have a liver biopsy biopsy was essentially negative.  Nightly liver enzymes have returned to normal as of repeat blood work about a week ago.  He is working with Dr. Cindee Salt on this.  He has been having intermittent "attacks" in that right upper quadrant area she says they do usually eventually ease off.  Has still felt really fatigued.  One of her providers has recommended an iron infusion.  Her last hemoglobin was normal at 12.0.  Also has several skin lesions that she would like me to look at she wants the one on her arm and shoulder frozen and the one on her right facial cheek frozen as well if possible.  Past Medical History:  Diagnosis Date  . Anal fissure   . Anemia   . Arthritis   . Blood transfusion   . C. difficile colitis   . Chest pain    a. 01/2013 MV: EF 59%, no ischemia.  . Chronic Dyspnea    a. 01/2013 Echo: EF 60-65%, Gr 1 DD, PASP 41mmHg.// Echo 01/2020: EF 60-65, no RWMA, GR 1 DD, GLS -21.6%, normal RV SF, trivial MR   . COVID-19   . DDD (degenerative disc disease), cervical   . DDD (degenerative disc disease), lumbar   . Diabetes mellitus   . Fibromyalgia   . Gall stones   . GERD (gastroesophageal reflux disease)    gastritis  . Hemorrhoids   . Hypertension   . Interstitial cystitis   . MCI (mild  cognitive impairment) 11/25/2017  . Memory difficulty 12/14/2013  . Neuromuscular disorder ([place])    sclerosis  . Osteoporosis   . Peptic ulcer   . PONV (postoperative nausea and vomiting)   . Pseudogout   . Raynaud phenomenon   . Rectal bleeding   . Sleep apnea    a. on cpap.  Marland Kitchen SVT (supraventricular tachycardia) ([place])   . Syncope 09/10/2015  . Thyroid disease    hypothyroidism  . Tremor, essential 05/14/2016    Past Surgical History:  Procedure Laterality Date  . APPENDECTOMY    . BLADDER SURGERY     x2  . BLADDER SURGERY    . BREAST BIOPSY    . CARDIAC CATHETERIZATION    . CARPAL TUNNEL RELEASE    . CATARACT EXTRACTION Bilateral   . CHOLECYSTECTOMY    . DILATION AND CURETTAGE OF UTERUS    . ENTEROCELE REPAIR     x2  . EYE SURGERY     retina  . hysterectomy - unknown type    . JOINT REPLACEMENT    . KNEE ARTHROPLASTY    . KNEE SURGERY     x6  . NECK SURGERY     fusion  . OOPHORECTOMY    . QUADRICEPS REPAIR Right   .  RECTOCELE REPAIR     x2  . SHOULDER ARTHROSCOPY WITH SUBACROMIAL DECOMPRESSION, ROTATOR CUFF REPAIR AND BICEP TENDON REPAIR  10/06/2012   Procedure: SHOULDER ARTHROSCOPY WITH SUBACROMIAL DECOMPRESSION, ROTATOR CUFF REPAIR AND BICEP TENDON REPAIR;  Surgeon: Nita Sells, MD;  Location: [place];  Service: Orthopedics;  Laterality: Right;  Arthroscopic  Repair  of  Subscapularis, Open Biceps Tenodesis  . SHOULDER SURGERY     bilateral- bones spur  . TONSILLECTOMY    . TOTAL KNEE ARTHROPLASTY    . TOTAL SHOULDER ARTHROPLASTY      Family History  Problem Relation Age of Onset  . Heart attack Mother   . Stroke Mother   . Diabetes Mother   . Heart disease Mother   . Colon polyps Mother   . Asthma Mother   . Breast cancer Maternal Grandmother   . Wilson's disease Maternal Grandmother   . Pancreatic cancer Maternal Grandfather   . Heart disease Father   . Heart attack Father   . Asthma Father   . Stroke Sister   .  Hypertension Sister   . Rheum arthritis Sister   . Dementia Sister   . Asthma Sister   . Lupus Sister   . Asthma Sister     Social History   Socioeconomic History  . Marital status: Married    Spouse name: Clarnce Flock"  . Number of children: 2  . Years of education: 82  . Highest education level: Associate degree: academic program  Occupational History  . Occupation: Retired  Tobacco Use  . Smoking status: Never Smoker  . Smokeless tobacco: Never Used  Vaping Use  . Vaping Use: Never used  Substance and Sexual Activity  . Alcohol use: No    Alcohol/week: 0.0 standard drinks  . Drug use: No  . Sexual activity: Not on file  Other Topics Concern  . Not on file  Social History Narrative   Lives with her husband. She likes to hang out with her friends and play games and do bible study.   Social Determinants of Health   Financial Resource Strain: Low Risk   . Difficulty of Paying Living Expenses: Not hard at all  Food Insecurity: No Food Insecurity  . Worried About Charity fundraiser in the Last Year: Never true  . Ran Out of Food in the Last Year: Never true  Transportation Needs: No Transportation Needs  . Lack of Transportation (Medical): No  . Lack of Transportation (Non-Medical): No  Physical Activity: Inactive  . Days of Exercise per Week: 0 days  . Minutes of Exercise per Session: 0 min  Stress: No Stress Concern Present  . Feeling of Stress : Not at all  Social Connections: Socially Integrated  . Frequency of Communication with Friends and Family: More than three times a week  . Frequency of Social Gatherings with Friends and Family: More than three times a week  . Attends Religious Services: More than 4 times per year  . Active Member of Clubs or Organizations: Yes  . Attends Archivist Meetings: More than 4 times per year  . Marital Status: Married  Human resources officer Violence: Not At Risk  . Fear of Current or Ex-Partner: No  . Emotionally  Abused: No  . Physically Abused: No  . Sexually Abused: No    Outpatient Medications Prior to Visit  Medication Sig Dispense Refill  . amLODipine (NORVASC) 5 MG tablet Take 1 tablet (5 mg total) by mouth daily.  90 tablet 3  . colestipol (COLESTID) 1 g tablet Take 1 g by mouth 2 (two) times daily.    . Cyanocobalamin (VITAMIN B-12 IJ) Inject 1 mL as directed every 30 (thirty) days.     Marland Kitchen denosumab (PROLIA) 60 MG/ML SOLN injection Inject 60 mg into the skin every 6 (six) months. Administer in upper arm, thigh, or abdomen    . donepezil (ARICEPT) 10 MG tablet TAKE 1 TABLET AT BEDTIME 90 tablet 3  . gabapentin (NEURONTIN) 300 MG capsule TAKE 1 TO 2 CAPSULES BY MOUTH UP TO TWO TIMES DAILY AS NEEDED 180 capsule 3  . ibuprofen (ADVIL) 600 MG tablet ibuprofen 600 mg tablet  TAKE 1 TABLET BY MOUTH EVERY 6 HOURS AS NEEDED    . ipratropium (ATROVENT) 0.03 % nasal spray Place 2 sprays into both nostrils every 12 (twelve) hours. 30 mL 1  . leflunomide (ARAVA) 20 MG tablet Take 1 tablet (20 mg total) by mouth daily. 30 tablet 2  . losartan (COZAAR) 50 MG tablet Take 2 tablets (100 mg total) by mouth daily. Take 1 tablet in the morning and 1 tablet in evening as needed for blood pressure greater than 150 180 tablet 3  . Melatonin 10 MG CAPS Take 10 capsules by mouth daily.    . memantine (NAMENDA) 10 MG tablet TAKE 1 TABLET TWICE DAILY 180 tablet 4  . metFORMIN (GLUCOPHAGE-XR) 500 MG 24 hr tablet TAKE 2 TABLETS EVERY DAY WITH BREAKFAST (Patient taking differently: Take 500 mg by mouth daily with breakfast.) 180 tablet 1  . metoprolol tartrate (LOPRESSOR) 100 MG tablet TAKE 1 AND 1/2 TABLETS TWICE DAILY 270 tablet 3  . ondansetron (ZOFRAN-ODT) 8 MG disintegrating tablet Take 1 tablet by mouth as needed for nausea.    . pantoprazole (PROTONIX) 40 MG tablet Take 40 mg by mouth 2 (two) times daily.    . polyethylene glycol powder (GLYCOLAX/MIRALAX) 17 GM/SCOOP powder Take 17 g by mouth daily as needed for mild  constipation or moderate constipation.     . primidone (MYSOLINE) 50 MG tablet TAKE 1 TABLET AT BEDTIME 90 tablet 1  . venlafaxine XR (EFFEXOR-XR) 75 MG 24 hr capsule TAKE 1 CAPSULE EVERY DAY WITH BREAKFAST 90 capsule 3   No facility-administered medications prior to visit.    Allergies  Allergen Reactions  . Codeine Nausea And Vomiting  . Myrbetriq  [Mirabegron] Other (See Comments)    SEVERE HEADACHE  . Percocet [Oxycodone-Acetaminophen] Nausea And Vomiting  . Propoxyphene Nausea And Vomiting  . Nystatin   . Celebrex [Celecoxib] Other (See Comments)    Other reaction(s): Other flushed  . Darvocet [Propoxyphene N-Acetaminophen] Nausea And Vomiting  . Erythromycin Nausea And Vomiting    Nausea and vomiting   . Hydrocodone Nausea And Vomiting  . Lyrica [Pregabalin] Swelling    Legs swelling   . Macrodantin [Nitrofurantoin] Nausea And Vomiting  . Toradol [Ketorolac Tromethamine] Nausea And Vomiting    Per patient, only PO form causes nausea and vomiting. Can take injection without issue  . Tramadol Nausea And Vomiting  . Verapamil Nausea And Vomiting    REACTION: intolerance    ROS Review of Systems    Objective:    Physical Exam Constitutional:      Appearance: She is well-developed.  HENT:     Head: Normocephalic and atraumatic.  Cardiovascular:     Rate and Rhythm: Normal rate and regular rhythm.     Heart sounds: Normal heart sounds.  Pulmonary:  Effort: Pulmonary effort is normal.     Breath sounds: Normal breath sounds.  Skin:    General: Skin is warm and dry.     Comments: Can lesion on right facial cheek is oval-shaped and has a slightly elevated pink border that is pearly in appearance  Neurological:     Mental Status: She is alert and oriented to person, place, and time.  Psychiatric:        Behavior: Behavior normal.     BP 135/61   Pulse 81   Ht 5\' 6"  (1.676 m)   Wt 165 lb 6.4 oz (75 kg)   SpO2 100%   BMI 26.70 kg/m  Wt Readings from  Last 3 Encounters:  03/18/21 165 lb 6.4 oz (75 kg)  03/04/21 170 lb (77.1 kg)  02/12/21 164 lb (74.4 kg)     Health Maintenance Due  Topic Date Due  . OPHTHALMOLOGY EXAM  04/10/2020  . COVID-19 Vaccine (4 - Booster for Moderna series) 01/02/2021    There are no preventive care reminders to display for this patient.  Lab Results  Component Value Date   TSH 1.34 05/21/2020   Lab Results  Component Value Date   WBC 7.7 12/09/2020   HGB 12.0 12/09/2020   HCT 37.2 12/09/2020   MCV 83.8 12/09/2020   PLT 394 12/09/2020   Lab Results  Component Value Date   NA 138 03/10/2021   K 4.4 03/10/2021   CO2 27 03/10/2021   GLUCOSE 100 (H) 03/10/2021   BUN 9 03/10/2021   CREATININE 0.75 03/10/2021   BILITOT 0.2 03/10/2021   ALKPHOS 73 05/21/2020   AST 22 03/10/2021   ALT 21 03/10/2021   PROT 6.3 03/10/2021   ALBUMIN 4.0 05/21/2020   CALCIUM 10.0 03/10/2021   ANIONGAP 12 02/22/2020   GFR 74.39 05/21/2020   Lab Results  Component Value Date   CHOL 128 06/07/2019   Lab Results  Component Value Date   HDL 52 06/07/2019   Lab Results  Component Value Date   LDLCALC 51 06/07/2019   Lab Results  Component Value Date   TRIG 97 11/07/2019   Lab Results  Component Value Date   CHOLHDL 2.5 06/07/2019   Lab Results  Component Value Date   HGBA1C 5.5 03/18/2021      Assessment & Plan:   Problem List Items Addressed This Visit      Endocrine   IFG (impaired fasting glucose)    Well-controlled.  She says she is eating more healthy and doing well.  She would like to try coming off of her metformin which I think would be completely reasonable so we will stop it for now and then plan to recheck her A1c in 3 to 4 months.      Relevant Orders   POCT glycosylated hemoglobin (Hb A1C) (Completed)     Other   B12 deficiency - Primary    Gust plan to increase B12 injections every 3 weeks to see if this helps with her energy levels.       Other Visit Diagnoses     Post-menopausal       Relevant Medications   denosumab (PROLIA) injection 60 mg (Completed)   Pica       Relevant Orders   Fe+TIBC+Fer   Skin lesion       Relevant Orders   Ambulatory referral to Dermatology   Seborrheic keratoses          Skin lesion on posterior left forearm  most consistent with a wartlike seborrheic keratosis.  Lesion on the right clavicle most consistent with a seborrheic keratosis.  Lesion on her right facial cheek most consistent with basal cell so recommend dermatology referral for further work-up.  Like a-we will check iron levels.  I do not see any in the system it has been years since it was last checked she has been crunching on ice a lot lately.  Shoulder pain she also just wanted to give me an update that she is seeing Dr. Maurene Capes tomorrow she is had an MRI and there and I discussed treatment plan options.  Meds ordered this encounter  Medications  . cyanocobalamin ((VITAMIN B-12)) injection 1,000 mcg  . denosumab (PROLIA) injection 60 mg    Cryotherapy Procedure Note  Pre-operative Diagnosis: Seborrheic keratosis/wart on left posterior forearm and small seborrheic keratosis on right upper shoulder near the clavicle.  Post-operative Diagnosis: same  Indications: irritation  Anesthesia: not required    Procedure Details  Patient informed of risks (permanent scarring, infection, light or dark discoloration, bleeding, infection, weakness, numbness and recurrence of the lesion) and benefits of the procedure and verbal informed consent obtained.  The areas are treated with liquid nitrogen therapy, frozen until ice ball extended 2 mm beyond lesion, allowed to thaw, and treated again. The patient tolerated procedure well.  The patient was instructed on post-op care, warned that there may be blister formation, redness and pain. Recommend OTC analgesia as needed for pain.  Condition: Stable  Complications: none.  Plan: 1. Instructed to keep the area  dry and covered for 24-48h and clean thereafter. 2. Warning signs of infection were reviewed.   3. Recommended that the patient use OTC acetaminophen as needed for pain.  4. Return PRN.    Follow-up: Return in about 4 months (around 07/19/2021) for Pre-diabetes check.    [name], MD

## 2021-03-18 NOTE — Assessment & Plan Note (Signed)
Well-controlled.  She says she is eating more healthy and doing well.  She would like to try coming off of her metformin which I think would be completely reasonable so we will stop it for now and then plan to recheck her A1c in 3 to 4 months.

## 2021-03-18 NOTE — Assessment & Plan Note (Signed)
Gust plan to increase B12 injections every 3 weeks to see if this helps with her energy levels.

## 2021-03-19 ENCOUNTER — Telehealth: Payer: Self-pay | Admitting: *Deleted

## 2021-03-19 DIAGNOSIS — M25512 Pain in left shoulder: Secondary | ICD-10-CM | POA: Diagnosis not present

## 2021-03-19 LAB — IRON,TIBC AND FERRITIN PANEL
%SAT: 6 % (calc) — ABNORMAL LOW (ref 16–45)
Ferritin: 15 ng/mL — ABNORMAL LOW (ref 16–288)
Iron: 33 ug/dL — ABNORMAL LOW (ref 45–160)
TIBC: 543 mcg/dL (calc) — ABNORMAL HIGH (ref 250–450)

## 2021-03-19 NOTE — Telephone Encounter (Signed)
Spoke w/pt and advised her of recommendations.  She was very grateful.

## 2021-03-19 NOTE — Telephone Encounter (Signed)
Okay to move forward with iron infusion actually think it would be helpful for her.  In regards to the surgery I think she should do it when it is convenient for her it sounds like he has a surgery coming up, so she may want to schedule it after that.

## 2021-03-19 NOTE — Telephone Encounter (Signed)
Pt called and wanted to know Dr. Gardiner Ramus opinion about whether or not she should have the infusion tomorrow. This is scheduled for 9 am and she wanted her to look over her results to know if she should cancel this.  Pt advised that it is Dr. Gardiner Ramus half day and that she may not see this until later. She voiced understanding.   /25/2022 12:30 AM - Interface, [facility] Lab Results In  Component Value Flag Ref Range Units Status  Iron 33  Low  45 - 160 mcg/dL Final  TIBC 543  High  250 - 450 mcg/dL (calc) Final  %SAT 6  Low  16 - 45 % (calc) Final  Ferritin 15  Low  16 - 288         She also saw the orthopedic surgeon today and would like to know her opinion on if she should have the surgery on her L shoulder 7/19 or wait. She said that she is not in that much pain and that her husband is scheduled to have surgery done 6/9 and she trusts Dr. Gardiner Ramus opinion/advice.

## 2021-03-20 ENCOUNTER — Encounter: Payer: Self-pay | Admitting: Family Medicine

## 2021-03-20 DIAGNOSIS — D509 Iron deficiency anemia, unspecified: Secondary | ICD-10-CM | POA: Diagnosis not present

## 2021-03-24 ENCOUNTER — Other Ambulatory Visit: Payer: Self-pay | Admitting: Cardiovascular Disease

## 2021-03-25 DIAGNOSIS — H6591 Unspecified nonsuppurative otitis media, right ear: Secondary | ICD-10-CM | POA: Diagnosis not present

## 2021-03-26 ENCOUNTER — Other Ambulatory Visit: Payer: Self-pay

## 2021-03-26 MED ORDER — METOPROLOL TARTRATE 100 MG PO TABS: ORAL_TABLET | ORAL | 0 refills | Status: DC

## 2021-03-26 MED ORDER — METOPROLOL TARTRATE 100 MG PO TABS: ORAL_TABLET | ORAL | 1 refills | Status: DC

## 2021-03-26 NOTE — Telephone Encounter (Signed)
Pt's medication was sent to pt's pharmacy [facility] order [facility] and [facility] local pharmacy 15 day supply until mail order arrives. Confirmation for both received.

## 2021-03-28 DIAGNOSIS — Z23 Encounter for immunization: Secondary | ICD-10-CM | POA: Diagnosis not present

## 2021-03-31 DIAGNOSIS — S92355A Nondisplaced fracture of fifth metatarsal bone, left foot, initial encounter for closed fracture: Secondary | ICD-10-CM | POA: Diagnosis not present

## 2021-04-02 ENCOUNTER — Other Ambulatory Visit: Payer: Self-pay | Admitting: Cardiovascular Disease

## 2021-04-04 ENCOUNTER — Other Ambulatory Visit: Payer: Self-pay

## 2021-04-07 DIAGNOSIS — L579 Skin changes due to chronic exposure to nonionizing radiation, unspecified: Secondary | ICD-10-CM | POA: Diagnosis not present

## 2021-04-07 DIAGNOSIS — D485 Neoplasm of uncertain behavior of skin: Secondary | ICD-10-CM | POA: Diagnosis not present

## 2021-04-07 DIAGNOSIS — L570 Actinic keratosis: Secondary | ICD-10-CM | POA: Diagnosis not present

## 2021-04-07 DIAGNOSIS — D0439 Carcinoma in situ of skin of other parts of face: Secondary | ICD-10-CM | POA: Diagnosis not present

## 2021-04-08 ENCOUNTER — Ambulatory Visit (INDEPENDENT_AMBULATORY_CARE_PROVIDER_SITE_OTHER): Payer: Medicare Other | Admitting: Physician Assistant

## 2021-04-08 ENCOUNTER — Other Ambulatory Visit: Payer: Self-pay

## 2021-04-08 VITALS — BP 118/60 | HR 77 | Resp 20 | Ht 66.0 in | Wt 165.0 lb

## 2021-04-08 DIAGNOSIS — D510 Vitamin B12 deficiency anemia due to intrinsic factor deficiency: Secondary | ICD-10-CM | POA: Diagnosis not present

## 2021-04-08 DIAGNOSIS — E538 Deficiency of other specified B group vitamins: Secondary | ICD-10-CM

## 2021-04-08 MED ORDER — CYANOCOBALAMIN 1000 MCG/ML IJ SOLN
1000.0000 ug | Freq: Once | INTRAMUSCULAR | Status: AC
  Administered 2021-04-08: 1000 ug via INTRAMUSCULAR

## 2021-04-08 NOTE — Progress Notes (Signed)
Established Patient Office Visit  Subjective:  Patient ID: Mia [name], female    DOB: 13-Sep-1951  Age: 70 y.o. MRN: [id number]  CC:  Chief Complaint  Patient presents with   B12 deficiency    HPI Kelcey Korus Ricco presents for for a B12 injection. Denies muscle cramps, weakness or irregular heart rate. Injection given in LUOQ, pt tolerated well with no apparent complications.   Past Medical History:  Diagnosis Date   Anal fissure    Anemia    Arthritis    Blood transfusion    C. difficile colitis    Chest pain    a. 01/2013 MV: EF 59%, no ischemia.   Chronic Dyspnea    a. 01/2013 Echo: EF 60-65%, Gr 1 DD, PASP 69mmHg.// Echo 01/2020: EF 60-65, no RWMA, GR 1 DD, GLS -21.6%, normal RV SF, trivial MR    COVID-19    DDD (degenerative disc disease), cervical    DDD (degenerative disc disease), lumbar    Diabetes mellitus    Fibromyalgia    Gall stones    GERD (gastroesophageal reflux disease)    gastritis   Hemorrhoids    Hypertension    Interstitial cystitis    MCI (mild cognitive impairment) 11/25/2017   Memory difficulty 12/14/2013   Neuromuscular disorder (HCC)    sclerosis   Osteoporosis    Peptic ulcer    PONV (postoperative nausea and vomiting)    Pseudogout    Raynaud phenomenon    Rectal bleeding    Sleep apnea    a. on cpap.   SVT (supraventricular tachycardia) ([place])    Syncope 09/10/2015   Thyroid disease    hypothyroidism   Tremor, essential 05/14/2016    Past Surgical History:  Procedure Laterality Date   APPENDECTOMY     BLADDER SURGERY     x2   BLADDER SURGERY     BREAST BIOPSY     CARDIAC CATHETERIZATION     CARPAL TUNNEL RELEASE     CATARACT EXTRACTION Bilateral    CHOLECYSTECTOMY     DILATION AND CURETTAGE OF UTERUS     ENTEROCELE REPAIR     x2   EYE SURGERY     retina   hysterectomy - unknown type     JOINT REPLACEMENT     KNEE ARTHROPLASTY     KNEE SURGERY     x6   NECK SURGERY     fusion   OOPHORECTOMY     QUADRICEPS REPAIR Right     RECTOCELE REPAIR     x2   SHOULDER ARTHROSCOPY WITH SUBACROMIAL DECOMPRESSION, ROTATOR CUFF REPAIR AND BICEP TENDON REPAIR  10/06/2012   Procedure: SHOULDER ARTHROSCOPY WITH SUBACROMIAL DECOMPRESSION, ROTATOR CUFF REPAIR AND BICEP TENDON REPAIR;  Surgeon: Nita Sells, MD;  Location: [facility];  Service: Orthopedics;  Laterality: Right;  Arthroscopic  Repair  of  Subscapularis, Open Biceps Tenodesis   SHOULDER SURGERY     bilateral- bones spur   TONSILLECTOMY     TOTAL KNEE ARTHROPLASTY     TOTAL SHOULDER ARTHROPLASTY      Family History  Problem Relation Age of Onset   Heart attack Mother    Stroke Mother    Diabetes Mother    Heart disease Mother    Colon polyps Mother    Asthma Mother    Breast cancer Maternal Grandmother    Wilson's disease Maternal Grandmother    Pancreatic cancer Maternal Grandfather    Heart disease Father  Heart attack Father    Asthma Father    Stroke Sister    Hypertension Sister    Rheum arthritis Sister    Dementia Sister    Asthma Sister    Lupus Sister    Asthma Sister     Social History   Socioeconomic History   Marital status: Married    Spouse name: Clarnce Flock"   Number of children: 2   Years of education: 14   Highest education level: Associate degree: academic program  Occupational History   Occupation: Retired  Tobacco Use   Smoking status: Never   Smokeless tobacco: Never  Vaping Use   Vaping Use: Never used  Substance and Sexual Activity   Alcohol use: No    Alcohol/week: 0.0 standard drinks   Drug use: No   Sexual activity: Not on file  Other Topics Concern   Not on file  Social History Narrative   Lives with her husband. She likes to hang out with her friends and play games and do bible study.   Social Determinants of Health   Financial Resource Strain: Low Risk    Difficulty of Paying Living Expenses: Not hard at all  Food Insecurity: No Food Insecurity   Worried About Paediatric nurse in the Last Year: Never true   [place] in the Last Year: Never true  Transportation Needs: No Transportation Needs   Lack of Transportation (Medical): No   Lack of Transportation (Non-Medical): No  Physical Activity: Inactive   Days of Exercise per Week: 0 days   Minutes of Exercise per Session: 0 min  Stress: No Stress Concern Present   Feeling of Stress : Not at all  Social Connections: Socially Integrated   Frequency of Communication with Friends and Family: More than three times a week   Frequency of Social Gatherings with Friends and Family: More than three times a week   Attends Religious Services: More than 4 times per year   Active Member of [employer] or Organizations: Yes   Attends Music therapist: More than 4 times per year   Marital Status: Married  Human resources officer Violence: Not At Risk   Fear of Current or Ex-Partner: No   Emotionally Abused: No   Physically Abused: No   Sexually Abused: No    Outpatient Medications Prior to Visit  Medication Sig Dispense Refill   amLODipine (NORVASC) 5 MG tablet Take 1 tablet (5 mg total) by mouth daily. 90 tablet 3   colestipol (COLESTID) 1 g tablet Take 1 g by mouth 2 (two) times daily.     Cyanocobalamin (VITAMIN B-12 IJ) Inject 1 mL as directed every 30 (thirty) days.      denosumab (PROLIA) 60 MG/ML SOLN injection Inject 60 mg into the skin every 6 (six) months. Administer in upper arm, thigh, or abdomen     donepezil (ARICEPT) 10 MG tablet TAKE 1 TABLET AT BEDTIME 90 tablet 3   gabapentin (NEURONTIN) 300 MG capsule TAKE 1 TO 2 CAPSULES BY MOUTH UP TO TWO TIMES DAILY AS NEEDED 180 capsule 3   ibuprofen (ADVIL) 600 MG tablet ibuprofen 600 mg tablet  TAKE 1 TABLET BY MOUTH EVERY 6 HOURS AS NEEDED     ipratropium (ATROVENT) 0.03 % nasal spray Place 2 sprays into both nostrils every 12 (twelve) hours. 30 mL 1   leflunomide (ARAVA) 20 MG tablet Take 1 tablet (20 mg total) by mouth daily. 30 tablet 2    losartan (  COZAAR) 50 MG tablet Take 2 tablets (100 mg total) by mouth daily. Take 1 tablet in the morning and 1 tablet in evening as needed for blood pressure greater than 150 180 tablet 3   Melatonin 10 MG CAPS Take 10 capsules by mouth daily.     memantine (NAMENDA) 10 MG tablet TAKE 1 TABLET TWICE DAILY 180 tablet 4   metFORMIN (GLUCOPHAGE-XR) 500 MG 24 hr tablet TAKE 2 TABLETS EVERY DAY WITH BREAKFAST (Patient taking differently: Take 500 mg by mouth daily with breakfast.) 180 tablet 1   metoprolol tartrate (LOPRESSOR) 100 MG tablet TAKE 1 AND 1/2 TABLETS TWICE DAILY 270 tablet 3   metoprolol tartrate (LOPRESSOR) 100 MG tablet TAKE 1 AND 1/2 TABLETS TWICE DAILY 45 tablet 0   ondansetron (ZOFRAN-ODT) 8 MG disintegrating tablet Take 1 tablet by mouth as needed for nausea.     pantoprazole (PROTONIX) 40 MG tablet Take 40 mg by mouth 2 (two) times daily.     polyethylene glycol powder (GLYCOLAX/MIRALAX) 17 GM/SCOOP powder Take 17 g by mouth daily as needed for mild constipation or moderate constipation.      primidone (MYSOLINE) 50 MG tablet TAKE 1 TABLET AT BEDTIME 90 tablet 1   venlafaxine XR (EFFEXOR-XR) 75 MG 24 hr capsule TAKE 1 CAPSULE EVERY DAY WITH BREAKFAST 90 capsule 3   No facility-administered medications prior to visit.    Allergies  Allergen Reactions   Codeine Nausea And Vomiting   Myrbetriq  [Mirabegron] Other (See Comments)    SEVERE HEADACHE   Percocet [Oxycodone-Acetaminophen] Nausea And Vomiting   Propoxyphene Nausea And Vomiting   Nystatin    Celebrex [Celecoxib] Other (See Comments)    Other reaction(s): Other flushed   Darvocet [Propoxyphene N-Acetaminophen] Nausea And Vomiting   Erythromycin Nausea And Vomiting    Nausea and vomiting    Hydrocodone Nausea And Vomiting   Lyrica [Pregabalin] Swelling    Legs swelling    Macrodantin [Nitrofurantoin] Nausea And Vomiting   Toradol [Ketorolac Tromethamine] Nausea And Vomiting    Per patient, only PO form causes  nausea and vomiting. Can take injection without issue   Tramadol Nausea And Vomiting   Verapamil Nausea And Vomiting    REACTION: intolerance    ROS Review of Systems    Objective:    Physical Exam  BP 118/60 (BP Location: Left Arm, Patient Position: Sitting, Cuff Size: Normal)   Pulse 77   Resp 20   Ht 5\' 6"  (1.676 m)   Wt 165 lb (74.8 kg)   SpO2 98%   BMI 26.63 kg/m  Wt Readings from Last 3 Encounters:  04/08/21 165 lb (74.8 kg)  03/18/21 165 lb 6.4 oz (75 kg)  03/04/21 170 lb (77.1 kg)     Health Maintenance Due  Topic Date Due   Zoster Vaccines- Shingrix (1 of 2) Never done   OPHTHALMOLOGY EXAM  04/10/2020   COVID-19 Vaccine (4 - Booster for Moderna series) 01/02/2021    There are no preventive care reminders to display for this patient.  Lab Results  Component Value Date   TSH 1.34 05/21/2020   Lab Results  Component Value Date   WBC 7.7 12/09/2020   HGB 12.0 12/09/2020   HCT 37.2 12/09/2020   MCV 83.8 12/09/2020   PLT 394 12/09/2020   Lab Results  Component Value Date   NA 138 03/10/2021   K 4.4 03/10/2021   CO2 27 03/10/2021   GLUCOSE 100 (H) 03/10/2021   BUN 9 03/10/2021  CREATININE 0.75 03/10/2021   BILITOT 0.2 03/10/2021   ALKPHOS 73 05/21/2020   AST 22 03/10/2021   ALT 21 03/10/2021   PROT 6.3 03/10/2021   ALBUMIN 4.0 05/21/2020   CALCIUM 10.0 03/10/2021   ANIONGAP 12 02/22/2020   GFR 74.39 05/21/2020   Lab Results  Component Value Date   CHOL 128 06/07/2019   Lab Results  Component Value Date   HDL 52 06/07/2019   Lab Results  Component Value Date   LDLCALC 51 06/07/2019   Lab Results  Component Value Date   TRIG 97 11/07/2019   Lab Results  Component Value Date   CHOLHDL 2.5 06/07/2019   Lab Results  Component Value Date   HGBA1C 5.5 03/18/2021      Assessment & Plan:  Injection given in LUOQ, pt tolerated well with no apparent complications. Pt will return in 3 weeks for next injection. Problem List Items  Addressed This Visit       Other   B12 deficiency - Primary   Pernicious anemia    Meds ordered this encounter  Medications   cyanocobalamin ((VITAMIN B-12)) injection 1,000 mcg    Follow-up: Return in about 3 weeks (around 04/29/2021) for B12 Injection.    Ninfa Meeker, CMA

## 2021-04-08 NOTE — Progress Notes (Signed)
Patient ID: Mia [name], female   DOB: 12-Jul-1951, 70 y.o.   MRN: [id number] Agree with the above plan

## 2021-04-09 ENCOUNTER — Encounter: Payer: Self-pay | Admitting: Family Medicine

## 2021-04-10 MED ORDER — FLUCONAZOLE 150 MG PO TABS: 150.0000 mg | ORAL_TABLET | Freq: Once | ORAL | 1 refills | Status: AC

## 2021-04-14 DIAGNOSIS — M79672 Pain in left foot: Secondary | ICD-10-CM | POA: Diagnosis not present

## 2021-04-14 DIAGNOSIS — S92355A Nondisplaced fracture of fifth metatarsal bone, left foot, initial encounter for closed fracture: Secondary | ICD-10-CM | POA: Diagnosis not present

## 2021-04-14 DIAGNOSIS — M79671 Pain in right foot: Secondary | ICD-10-CM | POA: Diagnosis not present

## 2021-04-15 DIAGNOSIS — M19012 Primary osteoarthritis, left shoulder: Secondary | ICD-10-CM | POA: Diagnosis not present

## 2021-04-15 DIAGNOSIS — M75112 Incomplete rotator cuff tear or rupture of left shoulder, not specified as traumatic: Secondary | ICD-10-CM | POA: Diagnosis not present

## 2021-04-15 DIAGNOSIS — M948X1 Other specified disorders of cartilage, shoulder: Secondary | ICD-10-CM | POA: Diagnosis not present

## 2021-04-15 DIAGNOSIS — M7542 Impingement syndrome of left shoulder: Secondary | ICD-10-CM | POA: Diagnosis not present

## 2021-04-15 DIAGNOSIS — S43432A Superior glenoid labrum lesion of left shoulder, initial encounter: Secondary | ICD-10-CM | POA: Diagnosis not present

## 2021-04-15 DIAGNOSIS — G8918 Other acute postprocedural pain: Secondary | ICD-10-CM | POA: Diagnosis not present

## 2021-04-15 HISTORY — PX: SHOULDER SURGERY: SHX246

## 2021-04-21 ENCOUNTER — Encounter

## 2021-04-23 MED ORDER — JANUVIA 100 MG TABLET
100 mg | ORAL_TABLET | ORAL | 3 refills | Status: AC
Start: 2021-04-23 — End: ?

## 2021-04-29 ENCOUNTER — Ambulatory Visit: Payer: Medicare Other

## 2021-05-01 ENCOUNTER — Other Ambulatory Visit: Payer: Self-pay

## 2021-05-01 ENCOUNTER — Ambulatory Visit (INDEPENDENT_AMBULATORY_CARE_PROVIDER_SITE_OTHER): Payer: Medicare Other | Admitting: Family Medicine

## 2021-05-01 VITALS — BP 129/67 | HR 65

## 2021-05-01 DIAGNOSIS — E538 Deficiency of other specified B group vitamins: Secondary | ICD-10-CM

## 2021-05-01 DIAGNOSIS — Z4789 Encounter for other orthopedic aftercare: Secondary | ICD-10-CM | POA: Diagnosis not present

## 2021-05-01 MED ORDER — CYANOCOBALAMIN 1000 MCG/ML IJ SOLN
1000.0000 ug | Freq: Once | INTRAMUSCULAR | Status: AC
  Administered 2021-05-01: 1000 ug via INTRAMUSCULAR

## 2021-05-01 NOTE — Progress Notes (Signed)
Agree with documentation as above.   Eisley Barber, MD  

## 2021-05-01 NOTE — Progress Notes (Signed)
Established Patient Office Visit  Subjective:  Patient ID: Mia [name], female    DOB: 06/02/1951  Age: 70 y.o. MRN: [id number]  CC:  Chief Complaint  Patient presents with   Pernicious Anemia    HPI Mia [name] is here for a vitamin B 12 injection. Denies muscle cramps, weakness or irregular heart rate.    Past Medical History:  Diagnosis Date   Anal fissure    Anemia    Arthritis    Blood transfusion    C. difficile colitis    Chest pain    a. 01/2013 MV: EF 59%, no ischemia.   Chronic Dyspnea    a. 01/2013 Echo: EF 60-65%, Gr 1 DD, PASP 67mHg.// Echo 01/2020: EF 60-65, no RWMA, GR 1 DD, GLS -21.6%, normal RV SF, trivial MR    COVID-19    DDD (degenerative disc disease), cervical    DDD (degenerative disc disease), lumbar    Diabetes mellitus    Fibromyalgia    Gall stones    GERD (gastroesophageal reflux disease)    gastritis   Hemorrhoids    Hypertension    Interstitial cystitis    MCI (mild cognitive impairment) 11/25/2017   Memory difficulty 12/14/2013   Neuromuscular disorder (HCC)    sclerosis   Osteoporosis    Peptic ulcer    PONV (postoperative nausea and vomiting)    Pseudogout    Raynaud phenomenon    Rectal bleeding    Sleep apnea    a. on cpap.   SVT (supraventricular tachycardia) ([place]    Syncope 09/10/2015   Thyroid disease    hypothyroidism   Tremor, essential 05/14/2016    Past Surgical History:  Procedure Laterality Date   APPENDECTOMY     BLADDER SURGERY     x2   BLADDER SURGERY     BREAST BIOPSY     CARDIAC CATHETERIZATION     CARPAL TUNNEL RELEASE     CATARACT EXTRACTION Bilateral    CHOLECYSTECTOMY     DILATION AND CURETTAGE OF UTERUS     ENTEROCELE REPAIR     x2   EYE SURGERY     retina   hysterectomy - unknown type     JOINT REPLACEMENT     KNEE ARTHROPLASTY     KNEE SURGERY     x6   NECK SURGERY     fusion   OOPHORECTOMY     QUADRICEPS REPAIR Right    RECTOCELE REPAIR     x2   SHOULDER ARTHROSCOPY WITH  SUBACROMIAL DECOMPRESSION, ROTATOR CUFF REPAIR AND BICEP TENDON REPAIR  10/06/2012   Procedure: SHOULDER ARTHROSCOPY WITH SUBACROMIAL DECOMPRESSION, ROTATOR CUFF REPAIR AND BICEP TENDON REPAIR;  Surgeon: JNita Sells MD;  Location: [facility]  Service: Orthopedics;  Laterality: Right;  Arthroscopic  Repair  of  Subscapularis, Open Biceps Tenodesis   SHOULDER SURGERY     bilateral- bones spur   TONSILLECTOMY     TOTAL KNEE ARTHROPLASTY     TOTAL SHOULDER ARTHROPLASTY      Family History  Problem Relation Age of Onset   Heart attack Mother    Stroke Mother    Diabetes Mother    Heart disease Mother    Colon polyps Mother    Asthma Mother    Breast cancer Maternal Grandmother    Wilson's disease Maternal Grandmother    Pancreatic cancer Maternal Grandfather    Heart disease Father    Heart attack Father  Asthma Father    Stroke Sister    Hypertension Sister    Rheum arthritis Sister    Dementia Sister    Asthma Sister    Lupus Sister    Asthma Sister     Social History   Socioeconomic History   Marital status: Married    Spouse name: Clarnce Flock"   Number of children: 2   Years of education: 14   Highest education level: Associate degree: academic program  Occupational History   Occupation: Retired  Tobacco Use   Smoking status: Never   Smokeless tobacco: Never  Vaping Use   Vaping Use: Never used  Substance and Sexual Activity   Alcohol use: No    Alcohol/week: 0.0 standard drinks   Drug use: No   Sexual activity: Not on file  Other Topics Concern   Not on file  Social History Narrative   Lives with her husband. She likes to hang out with her friends and play games and do bible study.   Social Determinants of Health   Financial Resource Strain: Low Risk    Difficulty of Paying Living Expenses: Not hard at all  Food Insecurity: No Food Insecurity   Worried About Charity fundraiser in the Last Year: Never true   [place]  in the Last Year: Never true  Transportation Needs: No Transportation Needs   Lack of Transportation (Medical): No   Lack of Transportation (Non-Medical): No  Physical Activity: Inactive   Days of Exercise per Week: 0 days   Minutes of Exercise per Session: 0 min  Stress: No Stress Concern Present   Feeling of Stress : Not at all  Social Connections: Socially Integrated   Frequency of Communication with Friends and Family: More than three times a week   Frequency of Social Gatherings with Friends and Family: More than three times a week   Attends Religious Services: More than 4 times per year   Active Member of [employer] or Organizations: Yes   Attends Music therapist: More than 4 times per year   Marital Status: Married  Human resources officer Violence: Not At Risk   Fear of Current or Ex-Partner: No   Emotionally Abused: No   Physically Abused: No   Sexually Abused: No    Outpatient Medications Prior to Visit  Medication Sig Dispense Refill   colestipol (COLESTID) 1 g tablet Take 1 g by mouth 2 (two) times daily.     Cyanocobalamin (VITAMIN B-12 IJ) Inject 1 mL as directed every 30 (thirty) days.      denosumab (PROLIA) 60 MG/ML SOLN injection Inject 60 mg into the skin every 6 (six) months. Administer in upper arm, thigh, or abdomen     donepezil (ARICEPT) 10 MG tablet TAKE 1 TABLET AT BEDTIME 90 tablet 3   gabapentin (NEURONTIN) 300 MG capsule TAKE 1 TO 2 CAPSULES BY MOUTH UP TO TWO TIMES DAILY AS NEEDED 180 capsule 3   ibuprofen (ADVIL) 600 MG tablet ibuprofen 600 mg tablet  TAKE 1 TABLET BY MOUTH EVERY 6 HOURS AS NEEDED     ipratropium (ATROVENT) 0.03 % nasal spray Place 2 sprays into both nostrils every 12 (twelve) hours. 30 mL 1   leflunomide (ARAVA) 20 MG tablet Take 1 tablet (20 mg total) by mouth daily. 30 tablet 2   losartan (COZAAR) 50 MG tablet Take 2 tablets (100 mg total) by mouth daily. Take 1 tablet in the morning and 1 tablet in evening as needed  for blood  pressure greater than 150 180 tablet 3   Melatonin 10 MG CAPS Take 10 capsules by mouth daily.     memantine (NAMENDA) 10 MG tablet TAKE 1 TABLET TWICE DAILY 180 tablet 4   metoprolol tartrate (LOPRESSOR) 100 MG tablet TAKE 1 AND 1/2 TABLETS TWICE DAILY 45 tablet 0   ondansetron (ZOFRAN-ODT) 8 MG disintegrating tablet Take 1 tablet by mouth as needed for nausea.     pantoprazole (PROTONIX) 40 MG tablet Take 40 mg by mouth 2 (two) times daily.     polyethylene glycol powder (GLYCOLAX/MIRALAX) 17 GM/SCOOP powder Take 17 g by mouth daily as needed for mild constipation or moderate constipation.      primidone (MYSOLINE) 50 MG tablet TAKE 1 TABLET AT BEDTIME 90 tablet 1   venlafaxine XR (EFFEXOR-XR) 75 MG 24 hr capsule TAKE 1 CAPSULE EVERY DAY WITH BREAKFAST 90 capsule 3   amLODipine (NORVASC) 5 MG tablet Take 1 tablet (5 mg total) by mouth daily. 90 tablet 3   metFORMIN (GLUCOPHAGE-XR) 500 MG 24 hr tablet TAKE 2 TABLETS EVERY DAY WITH BREAKFAST (Patient taking differently: Take 500 mg by mouth daily with breakfast.) 180 tablet 1   metoprolol tartrate (LOPRESSOR) 100 MG tablet TAKE 1 AND 1/2 TABLETS TWICE DAILY 270 tablet 3   No facility-administered medications prior to visit.    Allergies  Allergen Reactions   Codeine Nausea And Vomiting   Myrbetriq  [Mirabegron] Other (See Comments)    SEVERE HEADACHE   Percocet [Oxycodone-Acetaminophen] Nausea And Vomiting   Propoxyphene Nausea And Vomiting   Nystatin    Celebrex [Celecoxib] Other (See Comments)    Other reaction(s): Other flushed   Darvocet [Propoxyphene N-Acetaminophen] Nausea And Vomiting   Erythromycin Nausea And Vomiting    Nausea and vomiting    Hydrocodone Nausea And Vomiting   Lyrica [Pregabalin] Swelling    Legs swelling    Macrodantin [Nitrofurantoin] Nausea And Vomiting   Toradol [Ketorolac Tromethamine] Nausea And Vomiting    Per patient, only PO form causes nausea and vomiting. Can take injection without issue    Tramadol Nausea And Vomiting   Verapamil Nausea And Vomiting    REACTION: intolerance    ROS Review of Systems    Objective:    Physical Exam  BP 129/67   Pulse 65   SpO2 98%  Wt Readings from Last 3 Encounters:  04/08/21 165 lb (74.8 kg)  03/18/21 165 lb 6.4 oz (75 kg)  03/04/21 170 lb (77.1 kg)     Health Maintenance Due  Topic Date Due   Zoster Vaccines- Shingrix (1 of 2) Never done   OPHTHALMOLOGY EXAM  04/10/2020   COVID-19 Vaccine (4 - Booster for Moderna series) 01/02/2021    There are no preventive care reminders to display for this patient.  Lab Results  Component Value Date   TSH 1.34 05/21/2020   Lab Results  Component Value Date   WBC 7.7 12/09/2020   HGB 12.0 12/09/2020   HCT 37.2 12/09/2020   MCV 83.8 12/09/2020   PLT 394 12/09/2020   Lab Results  Component Value Date   NA 138 03/10/2021   K 4.4 03/10/2021   CO2 27 03/10/2021   GLUCOSE 100 (H) 03/10/2021   BUN 9 03/10/2021   CREATININE 0.75 03/10/2021   BILITOT 0.2 03/10/2021   ALKPHOS 73 05/21/2020   AST 22 03/10/2021   ALT 21 03/10/2021   PROT 6.3 03/10/2021   ALBUMIN 4.0 05/21/2020   CALCIUM 10.0 03/10/2021  ANIONGAP 12 02/22/2020   GFR 74.39 05/21/2020   Lab Results  Component Value Date   CHOL 128 06/07/2019   Lab Results  Component Value Date   HDL 52 06/07/2019   Lab Results  Component Value Date   LDLCALC 51 06/07/2019   Lab Results  Component Value Date   TRIG 97 11/07/2019   Lab Results  Component Value Date   CHOLHDL 2.5 06/07/2019   Lab Results  Component Value Date   HGBA1C 5.5 03/18/2021      Assessment & Plan:  B12 deficiency - Patient tolerated injection well without complications. Patient advised to schedule next injection 21 days from today. Unless directed by Dr Madilyn Fireman. Labs for B12 drawn today.    Problem List Items Addressed This Visit     B12 deficiency - Primary   Relevant Orders   B12    Meds ordered this encounter  Medications    cyanocobalamin ((VITAMIN B-12)) injection 1,000 mcg    Follow-up: Return in about 3 weeks (around 05/22/2021) for B12 injection. Unless directed by Dr Madilyn Fireman. Durene Romans, Monico Blitz, [place]

## 2021-05-02 LAB — VITAMIN B12: Vitamin B-12: 479 pg/mL (ref 200–1100)

## 2021-05-06 ENCOUNTER — Other Ambulatory Visit: Payer: Self-pay | Admitting: Physician Assistant

## 2021-05-06 DIAGNOSIS — D0439 Carcinoma in situ of skin of other parts of face: Secondary | ICD-10-CM | POA: Diagnosis not present

## 2021-05-06 DIAGNOSIS — M25512 Pain in left shoulder: Secondary | ICD-10-CM | POA: Diagnosis not present

## 2021-05-06 NOTE — Telephone Encounter (Signed)
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 03/06/2021 CBC: Hgb 11.7, Platelet Count 408, RDW 53.8  Okay to refill Arava?

## 2021-05-15 DIAGNOSIS — M25512 Pain in left shoulder: Secondary | ICD-10-CM | POA: Diagnosis not present

## 2021-05-19 DIAGNOSIS — S92352K Displaced fracture of fifth metatarsal bone, left foot, subsequent encounter for fracture with nonunion: Secondary | ICD-10-CM | POA: Diagnosis not present

## 2021-05-19 DIAGNOSIS — M79672 Pain in left foot: Secondary | ICD-10-CM | POA: Diagnosis not present

## 2021-05-20 ENCOUNTER — Other Ambulatory Visit (HOSPITAL_COMMUNITY): Payer: Self-pay | Admitting: Orthopedic Surgery

## 2021-05-21 ENCOUNTER — Telehealth: Payer: Self-pay | Admitting: Family Medicine

## 2021-05-21 NOTE — Chronic Care Management (AMB) (Signed)
  Chronic Care Management   Note  05/21/2021 Name: NATALIA GALLOP MRN: [id number] DOB: November 23, 1950  Cristy Friedlander Ginther is a 70 y.o. year old female who is a primary care patient of Madilyn Fireman, Rene Kocher, MD. I reached out to [place] by phone today in response to a referral sent by Ms. Dutch Gray PCP, Hali Marry, MD.   Ms. Rasnick was given information about Chronic Care Management services today including:  CCM service includes personalized support from designated clinical staff supervised by her physician, including individualized plan of care and coordination with other care providers 24/7 contact phone numbers for assistance for urgent and routine care needs. Service will only be billed when office clinical staff spend 20 minutes or more in a month to coordinate care. Only one practitioner may furnish and bill the service in a calendar month. The patient may stop CCM services at any time (effective at the end of the month) by phone call to the office staff.   Patient agreed to services and verbal consent obtained.   Follow up plan:   Lauretta Grill [facility] Scheduler

## 2021-05-22 ENCOUNTER — Ambulatory Visit: Payer: Medicare Other

## 2021-05-22 ENCOUNTER — Inpatient Hospital Stay: Admit: 2021-05-22 | Payer: MEDICARE | Primary: Internal Medicine

## 2021-05-22 DIAGNOSIS — C50512 Malignant neoplasm of lower-outer quadrant of left female breast: Secondary | ICD-10-CM

## 2021-05-22 MED ORDER — CITALOPRAM 10 MG TAB
10 mg | ORAL_TABLET | ORAL | 3 refills | Status: AC
Start: 2021-05-22 — End: ?

## 2021-05-23 DIAGNOSIS — S92352K Displaced fracture of fifth metatarsal bone, left foot, subsequent encounter for fracture with nonunion: Secondary | ICD-10-CM | POA: Diagnosis not present

## 2021-05-27 DIAGNOSIS — M25512 Pain in left shoulder: Secondary | ICD-10-CM | POA: Diagnosis not present

## 2021-05-29 ENCOUNTER — Telehealth: Payer: Self-pay

## 2021-05-29 ENCOUNTER — Ambulatory Visit (INDEPENDENT_AMBULATORY_CARE_PROVIDER_SITE_OTHER): Payer: Medicare Other | Admitting: Family Medicine

## 2021-05-29 VITALS — BP 118/75 | HR 61

## 2021-05-29 DIAGNOSIS — E538 Deficiency of other specified B group vitamins: Secondary | ICD-10-CM | POA: Diagnosis not present

## 2021-05-29 DIAGNOSIS — E611 Iron deficiency: Secondary | ICD-10-CM

## 2021-05-29 DIAGNOSIS — D510 Vitamin B12 deficiency anemia due to intrinsic factor deficiency: Secondary | ICD-10-CM

## 2021-05-29 MED ORDER — CYANOCOBALAMIN 1000 MCG/ML IJ SOLN
1000.0000 ug | Freq: Once | INTRAMUSCULAR | Status: AC
Start: 2021-05-29 — End: 2021-05-29
  Administered 2021-05-29: 1000 ug via INTRAMUSCULAR

## 2021-05-29 NOTE — Telephone Encounter (Signed)
Patient aware the orders are in and she can come at her earliest convenience. She also is aware that she can come to our office instead of the draw station downstairs.

## 2021-05-29 NOTE — Telephone Encounter (Signed)
Was in today for her Monthly B12 injection. She wanted to know when she was supposed to follow up on her iron levels. I let her know when she had them done last which was 03/18/21. She states she had an iron infusion after that and was told to have labs done in approx 6 weeks.

## 2021-05-29 NOTE — Progress Notes (Signed)
   Subjective:    Patient ID: Mia [name], female    DOB: 1951/04/07, 70 y.o.   MRN: [id number]  HPI Patient here today for a B12 injection. Denies muscle cramps, weakness or irregular heart rate.    Review of Systems     Objective:   Physical Exam        Assessment & Plan:  Patient tolerated injection well without complication in the LUOQ. Patient will return in one month for next injection.

## 2021-05-29 NOTE — Progress Notes (Signed)
Agree with documentation as above.   Mia Lecher, MD   Lab Results  Component Value Date   [id number] 05/01/2021

## 2021-05-29 NOTE — Telephone Encounter (Signed)
He can go anytime  Orders Placed This Encounter  Procedures   Fe+TIBC+Fer   CBC

## 2021-06-02 ENCOUNTER — Other Ambulatory Visit: Payer: Self-pay

## 2021-06-02 DIAGNOSIS — E611 Iron deficiency: Secondary | ICD-10-CM | POA: Diagnosis not present

## 2021-06-02 MED ORDER — MEMANTINE HCL 10 MG PO TABS
10.0000 mg | ORAL_TABLET | Freq: Two times a day (BID) | ORAL | 4 refills | Status: DC
Start: 2021-06-02 — End: 2021-11-13

## 2021-06-03 LAB — CBC
HCT: 41.3 % (ref 35.0–45.0)
Hemoglobin: 13 g/dL (ref 11.7–15.5)
MCH: 28.3 pg (ref 27.0–33.0)
MCHC: 31.5 g/dL — ABNORMAL LOW (ref 32.0–36.0)
MCV: 90 fL (ref 80.0–100.0)
MPV: 10.1 fL (ref 7.5–12.5)
Platelets: 377 10*3/uL (ref 140–400)
RBC: 4.59 10*6/uL (ref 3.80–5.10)
RDW: 15.8 % — ABNORMAL HIGH (ref 11.0–15.0)
WBC: 5.9 10*3/uL (ref 3.8–10.8)

## 2021-06-03 LAB — IRON,TIBC AND FERRITIN PANEL
%SAT: 37 % (calc) (ref 16–45)
Ferritin: 31 ng/mL (ref 16–288)
Iron: 179 ug/dL — ABNORMAL HIGH (ref 45–160)
TIBC: 482 mcg/dL (calc) — ABNORMAL HIGH (ref 250–450)

## 2021-06-05 DIAGNOSIS — K580 Irritable bowel syndrome with diarrhea: Secondary | ICD-10-CM | POA: Diagnosis not present

## 2021-06-05 DIAGNOSIS — D509 Iron deficiency anemia, unspecified: Secondary | ICD-10-CM | POA: Diagnosis not present

## 2021-06-05 DIAGNOSIS — K219 Gastro-esophageal reflux disease without esophagitis: Secondary | ICD-10-CM | POA: Diagnosis not present

## 2021-06-06 DIAGNOSIS — M25512 Pain in left shoulder: Secondary | ICD-10-CM | POA: Diagnosis not present

## 2021-06-06 DIAGNOSIS — R6889 Other general symptoms and signs: Secondary | ICD-10-CM | POA: Diagnosis not present

## 2021-06-10 ENCOUNTER — Other Ambulatory Visit: Payer: Self-pay

## 2021-06-10 DIAGNOSIS — Z79899 Other long term (current) drug therapy: Secondary | ICD-10-CM

## 2021-06-10 DIAGNOSIS — I7300 Raynaud's syndrome without gangrene: Secondary | ICD-10-CM | POA: Diagnosis not present

## 2021-06-10 DIAGNOSIS — M0609 Rheumatoid arthritis without rheumatoid factor, multiple sites: Secondary | ICD-10-CM

## 2021-06-10 LAB — COMPLETE METABOLIC PANEL WITH GFR
AG Ratio: 1.7 (calc) (ref 1.0–2.5)
ALT: 31 U/L — ABNORMAL HIGH (ref 6–29)
AST: 34 U/L (ref 10–35)
Albumin: 4 g/dL (ref 3.6–5.1)
Alkaline phosphatase (APISO): 88 U/L (ref 37–153)
BUN: 8 mg/dL (ref 7–25)
CO2: 27 mmol/L (ref 20–32)
Calcium: 9.4 mg/dL (ref 8.6–10.4)
Chloride: 102 mmol/L (ref 98–110)
Creat: 0.71 mg/dL (ref 0.50–1.05)
Globulin: 2.4 g/dL (calc) (ref 1.9–3.7)
Glucose, Bld: 119 mg/dL — ABNORMAL HIGH (ref 65–99)
Potassium: 4.5 mmol/L (ref 3.5–5.3)
Sodium: 135 mmol/L (ref 135–146)
Total Bilirubin: 0.4 mg/dL (ref 0.2–1.2)
Total Protein: 6.4 g/dL (ref 6.1–8.1)
eGFR: 92 mL/min/{1.73_m2} (ref >=60)

## 2021-06-10 LAB — CBC WITH DIFFERENTIAL/PLATELET
Absolute Monocytes: 541 cells/uL (ref 200–950)
Basophils Absolute: 61 cells/uL (ref 0–200)
Basophils Relative: 1.2 %
Eosinophils Absolute: 92 cells/uL (ref 15–500)
Eosinophils Relative: 1.8 %
HCT: 39.4 % (ref 35.0–45.0)
Hemoglobin: 12.1 g/dL (ref 11.7–15.5)
Lymphs Abs: 1754 cells/uL (ref 850–3900)
MCH: 27.8 pg (ref 27.0–33.0)
MCHC: 30.7 g/dL — ABNORMAL LOW (ref 32.0–36.0)
MCV: 90.6 fL (ref 80.0–100.0)
MPV: 10.9 fL (ref 7.5–12.5)
Monocytes Relative: 10.6 %
Neutro Abs: 2652 cells/uL (ref 1500–7800)
Neutrophils Relative %: 52 %
Platelets: 334 10*3/uL (ref 140–400)
RBC: 4.35 10*6/uL (ref 3.80–5.10)
RDW: 15.4 % — ABNORMAL HIGH (ref 11.0–15.0)
Total Lymphocyte: 34.4 %
WBC: 5.1 10*3/uL (ref 3.8–10.8)

## 2021-06-11 DIAGNOSIS — M25512 Pain in left shoulder: Secondary | ICD-10-CM | POA: Diagnosis not present

## 2021-06-11 DIAGNOSIS — R6889 Other general symptoms and signs: Secondary | ICD-10-CM | POA: Diagnosis not present

## 2021-06-11 NOTE — Progress Notes (Signed)
CBC is normal.  Glucose is mildly elevated probably not a fasting sample.  Liver functions are mildly elevated.  Please advise patient to avoid all NSAIDs and alcohol use.

## 2021-06-13 DIAGNOSIS — Z1231 Encounter for screening mammogram for malignant neoplasm of breast: Secondary | ICD-10-CM | POA: Diagnosis not present

## 2021-06-18 NOTE — Progress Notes (Addendum)
Office Visit Note  Patient: Mia [name]             Date of Birth: [birth year]           MRN: [id number]             PCP: Hali Marry, MD Referring: Hali Marry, * Visit Date: 07/02/2021 Occupation: '@GUAROCC'$ @  Subjective:  Medication monitoring   History of Present Illness: Mia [name] is a 70 y.o. female with history of seronegative rheumatoid arthritis, osteoarthritis, fibromyalgia, and DDD.  Patient is currently taking Arava 20 mg 1 tablet by mouth daily.  She continues to experience intermittent arthralgias but has no joint swelling at this time.  She denies any signs or symptoms of a rheumatoid arthritis flare recently.  She states that most of her discomfort comes from chronic lower back pain.  She will be following up with her spine specialist soon.  She states that she recently was evaluated by her orthopedist due to having a flareup trochanteric bursitis of the left hip.  Last week she had a cortisone injection which has alleviated some of her discomfort.  She was also given a Medrol Dosepak which has been helping her overall joint pain and fatigue.  She states that she has been going to physical therapy once a week for left shoulder range of motion and strengthening exercises status post surgery performed by Dr. Norwood Levo several months ago.  Her passive ROM continues to improve.  She remains on a lifting restriction.  She states she was also recently diagnosed with a Jones fracture, left foot.  She wore a walker boot for several weeks.  She has been following up with Dr. Doran Durand and was scheduled for surgery on 05/23/2021 which she had to postpone in order to allow her left shoulder to heal.  She will have to be nonweightbearing after surgery.  Patient reports that she has been advised to hold [place] for 3 weeks prior to surgery but then her surgery got canceled so she resumed taking Arava as prescribed.  She continues to find Arava to be effective at managing her  rheumatoid arthritis. She states that she has been having more frequent falls due to instability in her lower extremities.  She attributes the instability to muscular deconditioning.  She remains on prolia every 6 months by her PCP.        Activities of Daily Living:  Patient reports morning stiffness for 30 minutes  Patient Reports nocturnal pain.  Difficulty dressing/grooming: Denies Difficulty climbing stairs: Reports Difficulty getting out of chair: Reports Difficulty using hands for taps, buttons, cutlery, and/or writing: Denies  Review of Systems  Constitutional:  Negative for fatigue.  HENT:  Negative for mouth sores, mouth dryness and nose dryness.   Eyes:  Negative for pain, visual disturbance and dryness.  Respiratory:  Negative for cough, hemoptysis, shortness of breath and difficulty breathing.   Cardiovascular:  Negative for chest pain, palpitations, hypertension and swelling in legs/feet.  Gastrointestinal:  Positive for diarrhea (Followed by GI). Negative for blood in stool and constipation.  Endocrine: Negative for increased urination.  Genitourinary:  Negative for painful urination.  Musculoskeletal:  Positive for joint pain, joint pain and morning stiffness. Negative for joint swelling, myalgias, muscle weakness, muscle tenderness and myalgias.  Skin:  Negative for color change, pallor, rash, hair loss, nodules/bumps, skin tightness, ulcers and sensitivity to sunlight.  Allergic/Immunologic: Negative for susceptible to infections.  Neurological:  Negative for dizziness, numbness, headaches and weakness.  Hematological:  Negative for swollen glands.  Psychiatric/Behavioral:  Negative for depressed mood and sleep disturbance. The patient is not nervous/anxious.    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   Weakness of both  lower extremities 06/11/2020   Hyponatremia 05/22/2020   Gait instability 06/20/2019   Facet arthritis of lumbar region 05/10/2018   MCI (mild cognitive impairment) 11/25/2017   Restrictive lung disease 07/30/2017   Mixed hyperlipidemia 06/18/2017   Rectocele 04/26/2017   Irritable bowel syndrome with both constipation and diarrhea 04/26/2017   Osteoporosis 04/18/2017   Trochanteric bursitis of both hips 02/24/2017   Stress fracture of left tibia 11/05/2016   Inflammatory arthritis 09/30/2016   High risk medication use 09/30/2016   History of Clostridium difficile colitis 09/30/2016   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   Pseudogout 09/30/2016   DDD cervical spine status post fusion 09/30/2016   DDD thoracic spine 09/30/2016   H/O total knee replacement, right 09/30/2016   Age-related osteoporosis without current pathological fracture 09/30/2016   Tremor, essential 05/14/2016   Right foot pain 04/14/2016   B12 deficiency 09/10/2015   Syncope 09/10/2015   Cervical facet joint syndrome 09/10/2015   Chronic fatigue 09/10/2015   Aortic atherosclerosis ([place]) 04/01/2015   DOE (dyspnea on exertion)    Primary osteoarthritis of right knee 08/13/2014   Benign head tremor 06/11/2014   Lumbar degenerative disc disease 06/11/2014   IFG (impaired fasting glucose) 03/09/2014   Hemorrhoid 03/09/2014   Retinal wrinkling, right eye 03/09/2014   Fibromyalgia 03/09/2014   GERD (gastroesophageal reflux disease) 03/09/2014   History of arthroplasty of right knee 01/31/2014   Memory difficulty 12/14/2013   Hemorrhoids, external, thrombosed 06/22/2011   Hypertension 03/11/2011   SVT (supraventricular tachycardia) ([place])    Raynaud phenomenon    EDEMA 08/25/2010   Nonspecific (abnormal) findings on radiological and other examination of body structure 08/25/2010   COMPUTERIZED TOMOGRAPHY, CHEST, ABNORMAL 08/25/2010   OSA (obstructive sleep apnea) 04/24/2009   Allergic rhinitis  06/11/2008   Dyspnea 06/11/2008   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 difficile colitis    Chest pain    a. 01/2013 MV: EF 59%, no ischemia.   Chronic Dyspnea    a. 01/2013 Echo: EF 60-65%, Gr 1 DD, PASP 37mHg.// Echo 01/2020: EF 60-65, no RWMA, GR 1 DD, GLS -21.6%, normal RV SF, trivial MR    COVID-19    DDD (degenerative disc disease), cervical    DDD (degenerative disc disease), lumbar    Diabetes mellitus    Fibromyalgia    Gall stones    GERD (gastroesophageal reflux disease)    gastritis   Hemorrhoids    Hypertension    Interstitial cystitis    MCI (mild cognitive impairment) 11/25/2017   Memory difficulty 12/14/2013   Neuromuscular disorder ([facility])    sclerosis   Osteoporosis    Peptic ulcer    PONV (postoperative nausea and vomiting)    Pseudogout    Raynaud phenomenon    Rectal bleeding    Sleep apnea    a. on cpap.   SVT (supraventricular tachycardia) ([place]  Syncope 09/10/2015   Thyroid disease    hypothyroidism   Tremor, essential 05/14/2016    Family History  Problem Relation Age of Onset   Heart attack Mother    Stroke Mother    Diabetes Mother    Heart disease Mother    Colon polyps Mother    Asthma Mother    Breast cancer Maternal Grandmother    Wilson's disease Maternal Grandmother    Pancreatic cancer Maternal Grandfather    Heart disease Father    Heart attack Father    Asthma Father    Stroke Sister    Hypertension Sister    Rheum arthritis Sister    Dementia Sister    Asthma Sister    Lupus Sister    Asthma Sister    Past Surgical History:  Procedure Laterality Date   APPENDECTOMY     BLADDER SURGERY     x2   BLADDER SURGERY     BREAST BIOPSY     CARDIAC CATHETERIZATION     CARPAL TUNNEL RELEASE      CATARACT EXTRACTION Bilateral    CHOLECYSTECTOMY     DILATION AND CURETTAGE OF UTERUS     ENTEROCELE REPAIR     x2   EYE SURGERY     retina   hysterectomy - unknown type     JOINT REPLACEMENT     KNEE ARTHROPLASTY     KNEE SURGERY     x6   NECK SURGERY     fusion   OOPHORECTOMY     QUADRICEPS REPAIR Right    RECTOCELE REPAIR     x2   SHOULDER ARTHROSCOPY WITH SUBACROMIAL DECOMPRESSION, ROTATOR CUFF REPAIR AND BICEP TENDON REPAIR  10/06/2012   Procedure: SHOULDER ARTHROSCOPY WITH SUBACROMIAL DECOMPRESSION, ROTATOR CUFF REPAIR AND BICEP TENDON REPAIR;  Surgeon: Nita Sells, MD;  Location: [facility];  Service: Orthopedics;  Laterality: Right;  Arthroscopic  Repair  of  Subscapularis, Open Biceps Tenodesis   SHOULDER SURGERY     bilateral- bones spur   SHOULDER SURGERY Left 04/15/2021   TONSILLECTOMY     TOTAL KNEE ARTHROPLASTY     TOTAL SHOULDER ARTHROPLASTY     Social History   Social History Narrative   Lives with her husband. She likes to hang out with her friends and play games and do bible study.   Immunization History  Administered Date(s) Administered   Fluad Quad(high Dose 65+) 07/13/2019, 07/10/2020   Hepatitis B, adult 02/25/2021   Influenza Split 07/26/2012, 06/11/2014, 07/25/2015, 06/23/2018   Influenza Whole 07/30/2009, 08/19/2010   Influenza, High Dose Seasonal PF 07/06/2016, 07/05/2017   Influenza,inj,Quad PF,6+ Mos 06/11/2014, 07/25/2015, 06/23/2018   Influenza-Unspecified 07/26/2013, 09/05/2013, 07/13/2019, 07/10/2020   Moderna Sars-Covid-2 Vaccination 02/29/2020, 03/28/2020, 10/04/2020   Pneumococcal Conjugate-13 01/04/2017   Pneumococcal Polysaccharide-23 07/30/2009, 11/14/2018   Pneumococcal-Unspecified 10/27/2015   Tdap 02/13/2008, 05/12/2018   Zoster, Live 09/01/2011     Objective: Vital Signs: BP 127/75 (BP Location: Left Arm, Patient Position: Sitting, Cuff Size: Normal)   Pulse (!) 58   Ht '5\' 6"'$  (1.676 m)   Wt 172  lb (78 kg)   BMI 27.76 kg/m    Physical Exam Vitals and nursing note reviewed.  Constitutional:      Appearance: She is well-developed.  HENT:     Head: Normocephalic and atraumatic.  Eyes:     Conjunctiva/sclera: Conjunctivae normal.  Pulmonary:     Effort: Pulmonary effort is normal.  Abdominal:     Palpations: Abdomen  is soft.  Musculoskeletal:     Cervical back: Normal range of motion.  Skin:    General: Skin is warm and dry.     Capillary Refill: Capillary refill takes less than 2 seconds.  Neurological:     Mental Status: She is alert and oriented to person, place, and time.  Psychiatric:        Behavior: Behavior normal.     Musculoskeletal Exam: C-spine has good ROM.  Painful ROM of lumbar spine.  Right shoulder has good ROM.  Left shoulder has good passive ROM but limited active forward flexion and abduction.  Elbow joints, wrist joints, MCPs, PIPs, and DIPs good ROM with no synovitis.  Complete fist formation bilaterally.  PIP and DIP thickening consistent with osteoarthritis of both hands.  Hip joints have good ROM bilaterally.  Tenderness over the left trochanteric bursa. Right knee replacement has good ROM.  Left knee has good ROM with no discomfort.  No warmth or effusion noted. Ankle joints have good ROM with no tenderness or joint swelling. Tenderness over the base of the left 5th metatarsal.   CDAI Exam: CDAI Score: 0.4  Patient Global: 2 mm; Provider Global: 2 mm Swollen: 0 ; Tender: 0  Joint Exam 07/02/2021   No joint exam has been documented for this visit   There is currently no information documented on the homunculus. Go to the Rheumatology activity and complete the homunculus joint exam.  Investigation: No additional findings.  Imaging: No results found.  Recent Labs: Lab Results  Component Value Date   WBC 5.1 06/10/2021   HGB 12.1 06/10/2021   PLT 334 06/10/2021   NA 135 06/10/2021   K 4.5 06/10/2021   CL 102 06/10/2021   CO2 27  06/10/2021   GLUCOSE 119 (H) 06/10/2021   BUN 8 06/10/2021   CREATININE 0.71 06/10/2021   BILITOT 0.4 06/10/2021   ALKPHOS 73 05/21/2020   AST 34 06/10/2021   ALT 31 (H) 06/10/2021   PROT 6.4 06/10/2021   ALBUMIN 4.0 05/21/2020   CALCIUM 9.4 06/10/2021   GFRAA 94 03/10/2021    Speciality Comments: No specialty comments available.  Procedures:  No procedures performed Allergies: Codeine, Myrbetriq  [mirabegron], Percocet [oxycodone-acetaminophen], Propoxyphene, Nystatin, Celebrex [celecoxib], Darvocet [propoxyphene n-acetaminophen], Erythromycin, Hydrocodone, Lyrica [pregabalin], Macrodantin [nitrofurantoin], Toradol [ketorolac tromethamine], Tramadol, and Verapamil   Assessment / Plan:     Visit Diagnoses: Seronegative rheumatoid arthritis ([place]): She has no synovitis on examination today.  Her rheumatoid arthritis seems well controlled on Arava 20 mg 1 tablet by mouth daily.  She continues to tolerate Arava and has not missed any doses recently.  She has not had any recent rheumatoid arthritis flares.  She was recently scheduled for repair of a Jones fracture with Dr. Doran Durand on 05/23/2021 at which time she held [place] for 3 weeks prior to surgery.  The surgery had to be canceled so she resumed taking Arava as prescribed.  While having a gap in therapy she did notice some increased pain and inflammation especially in her hands and wrists which is since resolved.  She continues to find Arava to be effective at managing her rheumatoid arthritis.  She will continue on the current treatment regimen.  She was advised to notify us if she develops increased joint pain or joint swelling.  She will follow-up in the office in 5 months.  High risk medication use - Arava 20 mg 1 tablet by mouth daily.CBC and CMP were drawn on 06/10/2021.  Her next  lab work will be due in November and every 3 months to monitor for drug toxicity.  Standing orders for CBC and CMP remain in place. Discussed the importance of  holding [place] if she develops signs or symptoms of an infection and to resume once the infection has completely cleared.  Elevated LFTs: ALT was 31 and AST was 34 on 06/10/2021.  Followed by gastroenterology.  She will continue to require updated lab work every 3 months.  Raynaud's phenomenon without gangrene: Not currently active.  No digital ulcerations or signs of sclerodactyly noted.  Primary osteoarthritis of left knee: Chronic pain.  She has good range of motion of the left knee joint with no warmth or effusion on examination today.  She has had some increased instability in bilateral lower extremities due to muscular deconditioning.  Discussed that she would benefit from physical therapy for lower extremity muscle strengthening and fall prevention.  We also discussed the use of a cane or walker to assist with ambulation.   H/O total knee replacement, right: Doing well.  She has good ROM of the right knee replacement.   Pseudogout: She has not had any recent flares.    S/P arthroscopy of left shoulder: Performed by Dr. Norwood Levo on 04/15/2021.  She had an incomplete rotator cuff tear.  She continues to go to physical therapy once a week.  Overall her discomfort has become tolerable.  DDD (degenerative disc disease), cervical: She has good range of motion of the C-spine with no discomfort currently.  No symptoms of radiculopathy.  DDD (degenerative disc disease), thoracic: Chronic pain   DDD (degenerative disc disease), lumbar - She is followed by Dr. Lurene Shadow.  Chronic pain.  She is been experiencing some lower extremity instability.  A referral to physical therapy was discussed for lower extremity muscle strengthening and fall prevention.  We also discussed the use of a cane or walker to assist with ambulation.  Fibromyalgia: She experiences intermittent myalgias and muscle tenderness due to fibromyalgia.  She remains on gabapentin as prescribed.  Discussed the importance of regular exercise  and good sleep hygiene.  Other osteoporosis without current pathological fracture - DEXA on 06/07/19 right femoral neck BMD 0.564 with T-score -2.6, +6%.  DEXA ordered and monitored by PCP. Hx of stress fractures and recent Jones fracture.  She had an updated DEXA on 06/20/2021 5, neck BMD 0.565 with T score -2.6.  Statistically significant increase in all BMD in all sites measured.  She remains on prolia 60 mg sq injections every 6 months.  Previously treated with fosamax.   History of foot fracture: She has been following up closely with Dr. Doran Durand for a closed fracture of the fifth metatarsal.  She was scheduled for surgical repair on 05/23/2021 but her surgery had to be canceled due to her having left shoulder arthroscopic surgery on 04/15/2021.  She has not rescheduled her surgery with Dr. Doran Durand yet.  According to the patient Dr. Doran Durand recommended holding [place] for 3 weeks prior to surgery in the future.  S/P carpal tunnel release - Performed by Dr. Grandville Silos.  Carpal tunnel syndrome, right upper limb - NCV with EMG on 08/19/2017 moderate right median nerve entrapment at the wrist affecting sensory and motor components.    Chronic fatigue: Stable  Other medical conditions are listed as follows:  History of hypertension: Blood pressure is 125/75 today in the office.  History of gastroesophageal reflux (GERD)  History of CHF (congestive heart failure)  IC (interstitial cystitis)  History of  Clostridium difficile colitis  Orders: No orders of the defined types were placed in this encounter.  No orders of the defined types were placed in this encounter.    Follow-Up Instructions: Return in about 5 months (around 12/02/2021) for Rheumatoid arthritis, Osteoarthritis, Fibromyalgia, DDD.   Mia Neas, PA-C  Note - This record has been created using Dragon software.  Chart creation errors have been sought, but may not always  have been located. Such creation errors do not reflect on   the standard of medical care.

## 2021-06-19 ENCOUNTER — Encounter (HOSPITAL_BASED_OUTPATIENT_CLINIC_OR_DEPARTMENT_OTHER): Payer: Self-pay

## 2021-06-19 ENCOUNTER — Ambulatory Visit (HOSPITAL_BASED_OUTPATIENT_CLINIC_OR_DEPARTMENT_OTHER): Admit: 2021-06-19 | Payer: Medicare Other | Admitting: Orthopedic Surgery

## 2021-06-19 DIAGNOSIS — M25512 Pain in left shoulder: Secondary | ICD-10-CM | POA: Diagnosis not present

## 2021-06-19 DIAGNOSIS — R6889 Other general symptoms and signs: Secondary | ICD-10-CM | POA: Diagnosis not present

## 2021-06-19 SURGERY — OPEN REDUCTION INTERNAL FIXATION (ORIF) METATARSAL (TOE) FRACTURE
Anesthesia: General | Site: Toe | Laterality: Right

## 2021-06-20 ENCOUNTER — Encounter: Payer: Self-pay | Admitting: Family Medicine

## 2021-06-20 DIAGNOSIS — M810 Age-related osteoporosis without current pathological fracture: Secondary | ICD-10-CM | POA: Diagnosis not present

## 2021-06-20 DIAGNOSIS — M85852 Other specified disorders of bone density and structure, left thigh: Secondary | ICD-10-CM | POA: Diagnosis not present

## 2021-06-24 DIAGNOSIS — H35371 Puckering of macula, right eye: Secondary | ICD-10-CM | POA: Diagnosis not present

## 2021-06-24 DIAGNOSIS — E119 Type 2 diabetes mellitus without complications: Secondary | ICD-10-CM | POA: Diagnosis not present

## 2021-06-24 LAB — HM DIABETES EYE EXAM

## 2021-06-25 DIAGNOSIS — M5442 Lumbago with sciatica, left side: Secondary | ICD-10-CM | POA: Diagnosis not present

## 2021-06-25 DIAGNOSIS — M545 Low back pain, unspecified: Secondary | ICD-10-CM | POA: Diagnosis not present

## 2021-06-25 DIAGNOSIS — M7062 Trochanteric bursitis, left hip: Secondary | ICD-10-CM | POA: Diagnosis not present

## 2021-06-25 DIAGNOSIS — R6889 Other general symptoms and signs: Secondary | ICD-10-CM | POA: Diagnosis not present

## 2021-06-25 DIAGNOSIS — M25512 Pain in left shoulder: Secondary | ICD-10-CM | POA: Diagnosis not present

## 2021-06-26 ENCOUNTER — Other Ambulatory Visit: Payer: Self-pay

## 2021-06-26 ENCOUNTER — Ambulatory Visit (INDEPENDENT_AMBULATORY_CARE_PROVIDER_SITE_OTHER): Payer: Medicare Other | Admitting: Family Medicine

## 2021-06-26 VITALS — BP 140/66 | HR 65

## 2021-06-26 DIAGNOSIS — D510 Vitamin B12 deficiency anemia due to intrinsic factor deficiency: Secondary | ICD-10-CM

## 2021-06-26 MED ORDER — CYANOCOBALAMIN 1000 MCG/ML IJ SOLN
1000.0000 ug | Freq: Once | INTRAMUSCULAR | Status: AC
  Administered 2021-06-26: 1000 ug via INTRAMUSCULAR

## 2021-06-26 NOTE — Progress Notes (Signed)
Established Patient Office Visit  Subjective:  Patient ID: Mia [name], female    DOB: 06/02/1951  Age: 70 y.o. MRN: [id number]  CC:  Chief Complaint  Patient presents with   Pernicious Anemia    HPI Mia [name] is here for a vitamin B 12 injection. Denies muscle cramps, weakness or irregular heart rate.    Past Medical History:  Diagnosis Date   Anal fissure    Anemia    Arthritis    Blood transfusion    C. difficile colitis    Chest pain    a. 01/2013 MV: EF 59%, no ischemia.   Chronic Dyspnea    a. 01/2013 Echo: EF 60-65%, Gr 1 DD, PASP 67mHg.// Echo 01/2020: EF 60-65, no RWMA, GR 1 DD, GLS -21.6%, normal RV SF, trivial MR    COVID-19    DDD (degenerative disc disease), cervical    DDD (degenerative disc disease), lumbar    Diabetes mellitus    Fibromyalgia    Gall stones    GERD (gastroesophageal reflux disease)    gastritis   Hemorrhoids    Hypertension    Interstitial cystitis    MCI (mild cognitive impairment) 11/25/2017   Memory difficulty 12/14/2013   Neuromuscular disorder (HCC)    sclerosis   Osteoporosis    Peptic ulcer    PONV (postoperative nausea and vomiting)    Pseudogout    Raynaud phenomenon    Rectal bleeding    Sleep apnea    a. on cpap.   SVT (supraventricular tachycardia) ([facility]    Syncope 09/10/2015   Thyroid disease    hypothyroidism   Tremor, essential 05/14/2016    Past Surgical History:  Procedure Laterality Date   APPENDECTOMY     BLADDER SURGERY     x2   BLADDER SURGERY     BREAST BIOPSY     CARDIAC CATHETERIZATION     CARPAL TUNNEL RELEASE     CATARACT EXTRACTION Bilateral    CHOLECYSTECTOMY     DILATION AND CURETTAGE OF UTERUS     ENTEROCELE REPAIR     x2   EYE SURGERY     retina   hysterectomy - unknown type     JOINT REPLACEMENT     KNEE ARTHROPLASTY     KNEE SURGERY     x6   NECK SURGERY     fusion   OOPHORECTOMY     QUADRICEPS REPAIR Right    RECTOCELE REPAIR     x2   SHOULDER ARTHROSCOPY WITH  SUBACROMIAL DECOMPRESSION, ROTATOR CUFF REPAIR AND BICEP TENDON REPAIR  10/06/2012   Procedure: SHOULDER ARTHROSCOPY WITH SUBACROMIAL DECOMPRESSION, ROTATOR CUFF REPAIR AND BICEP TENDON REPAIR;  Surgeon: JNita Sells MD;  Location: [facility]  Service: Orthopedics;  Laterality: Right;  Arthroscopic  Repair  of  Subscapularis, Open Biceps Tenodesis   SHOULDER SURGERY     bilateral- bones spur   TONSILLECTOMY     TOTAL KNEE ARTHROPLASTY     TOTAL SHOULDER ARTHROPLASTY      Family History  Problem Relation Age of Onset   Heart attack Mother    Stroke Mother    Diabetes Mother    Heart disease Mother    Colon polyps Mother    Asthma Mother    Breast cancer Maternal Grandmother    Wilson's disease Maternal Grandmother    Pancreatic cancer Maternal Grandfather    Heart disease Father    Heart attack Father  Asthma Father    Stroke Sister    Hypertension Sister    Rheum arthritis Sister    Dementia Sister    Asthma Sister    Lupus Sister    Asthma Sister     Social History   Socioeconomic History   Marital status: Married    Spouse name: Clarnce Flock"   Number of children: 2   Years of education: 14   Highest education level: Associate degree: academic program  Occupational History   Occupation: Retired  Tobacco Use   Smoking status: Never   Smokeless tobacco: Never  Vaping Use   Vaping Use: Never used  Substance and Sexual Activity   Alcohol use: No    Alcohol/week: 0.0 standard drinks   Drug use: No   Sexual activity: Not on file  Other Topics Concern   Not on file  Social History Narrative   Lives with her husband. She likes to hang out with her friends and play games and do bible study.   Social Determinants of Health   Financial Resource Strain: Low Risk    Difficulty of Paying Living Expenses: Not hard at all  Food Insecurity: No Food Insecurity   Worried About Charity fundraiser in the Last Year: Never true   [place]  in the Last Year: Never true  Transportation Needs: No Transportation Needs   Lack of Transportation (Medical): No   Lack of Transportation (Non-Medical): No  Physical Activity: Inactive   Days of Exercise per Week: 0 days   Minutes of Exercise per Session: 0 min  Stress: No Stress Concern Present   Feeling of Stress : Not at all  Social Connections: Socially Integrated   Frequency of Communication with Friends and Family: More than three times a week   Frequency of Social Gatherings with Friends and Family: More than three times a week   Attends Religious Services: More than 4 times per year   Active Member of [employer] or Organizations: Yes   Attends Music therapist: More than 4 times per year   Marital Status: Married  Human resources officer Violence: Not At Risk   Fear of Current or Ex-Partner: No   Emotionally Abused: No   Physically Abused: No   Sexually Abused: No    Outpatient Medications Prior to Visit  Medication Sig Dispense Refill   colestipol (COLESTID) 1 g tablet Take 1 g by mouth 2 (two) times daily.     Cyanocobalamin (VITAMIN B-12 IJ) Inject 1 mL as directed every 30 (thirty) days.      denosumab (PROLIA) 60 MG/ML SOLN injection Inject 60 mg into the skin every 6 (six) months. Administer in upper arm, thigh, or abdomen     donepezil (ARICEPT) 10 MG tablet TAKE 1 TABLET AT BEDTIME 90 tablet 3   gabapentin (NEURONTIN) 300 MG capsule TAKE 1 TO 2 CAPSULES BY MOUTH UP TO TWO TIMES DAILY AS NEEDED 180 capsule 3   ibuprofen (ADVIL) 600 MG tablet ibuprofen 600 mg tablet  TAKE 1 TABLET BY MOUTH EVERY 6 HOURS AS NEEDED     ipratropium (ATROVENT) 0.03 % nasal spray Place 2 sprays into both nostrils every 12 (twelve) hours. 30 mL 1   leflunomide (ARAVA) 20 MG tablet TAKE ONE TABLET BY MOUTH DAILY 90 tablet 0   losartan (COZAAR) 50 MG tablet Take 2 tablets (100 mg total) by mouth daily. Take 1 tablet in the morning and 1 tablet in evening as needed for blood pressure  greater  than 150 180 tablet 3   Melatonin 10 MG CAPS Take 10 capsules by mouth daily.     memantine (NAMENDA) 10 MG tablet Take 1 tablet (10 mg total) by mouth 2 (two) times daily. 180 tablet 4   metoprolol tartrate (LOPRESSOR) 100 MG tablet TAKE 1 AND 1/2 TABLETS TWICE DAILY 45 tablet 0   ondansetron (ZOFRAN-ODT) 8 MG disintegrating tablet Take 1 tablet by mouth as needed for nausea.     pantoprazole (PROTONIX) 40 MG tablet Take 40 mg by mouth 2 (two) times daily.     polyethylene glycol powder (GLYCOLAX/MIRALAX) 17 GM/SCOOP powder Take 17 g by mouth daily as needed for mild constipation or moderate constipation.      primidone (MYSOLINE) 50 MG tablet TAKE 1 TABLET AT BEDTIME 90 tablet 1   venlafaxine XR (EFFEXOR-XR) 75 MG 24 hr capsule TAKE 1 CAPSULE EVERY DAY WITH BREAKFAST 90 capsule 3   amLODipine (NORVASC) 5 MG tablet Take 1 tablet (5 mg total) by mouth daily. 90 tablet 3   No facility-administered medications prior to visit.    Allergies  Allergen Reactions   Codeine Nausea And Vomiting   Myrbetriq  [Mirabegron] Other (See Comments)    SEVERE HEADACHE   Percocet [Oxycodone-Acetaminophen] Nausea And Vomiting   Propoxyphene Nausea And Vomiting   Nystatin    Celebrex [Celecoxib] Other (See Comments)    Other reaction(s): Other flushed   Darvocet [Propoxyphene N-Acetaminophen] Nausea And Vomiting   Erythromycin Nausea And Vomiting    Nausea and vomiting    Hydrocodone Nausea And Vomiting   Lyrica [Pregabalin] Swelling    Legs swelling    Macrodantin [Nitrofurantoin] Nausea And Vomiting   Toradol [Ketorolac Tromethamine] Nausea And Vomiting    Per patient, only PO form causes nausea and vomiting. Can take injection without issue   Tramadol Nausea And Vomiting   Verapamil Nausea And Vomiting    REACTION: intolerance    ROS Review of Systems    Objective:    Physical Exam  BP 140/66   Pulse 65   SpO2 99%  Wt Readings from Last 3 Encounters:  04/08/21 165 lb (74.8 kg)   03/18/21 165 lb 6.4 oz (75 kg)  03/04/21 170 lb (77.1 kg)     Health Maintenance Due  Topic Date Due   Zoster Vaccines- Shingrix (1 of 2) Never done   OPHTHALMOLOGY EXAM  04/10/2020   COVID-19 Vaccine (4 - Booster for Moderna series) 01/02/2021   INFLUENZA VACCINE  05/26/2021   DEXA SCAN  06/06/2021    There are no preventive care reminders to display for this patient.  Lab Results  Component Value Date   TSH 1.34 05/21/2020   Lab Results  Component Value Date   WBC 5.1 06/10/2021   HGB 12.1 06/10/2021   HCT 39.4 06/10/2021   MCV 90.6 06/10/2021   PLT 334 06/10/2021   Lab Results  Component Value Date   NA 135 06/10/2021   K 4.5 06/10/2021   CO2 27 06/10/2021   GLUCOSE 119 (H) 06/10/2021   BUN 8 06/10/2021   CREATININE 0.71 06/10/2021   BILITOT 0.4 06/10/2021   ALKPHOS 73 05/21/2020   AST 34 06/10/2021   ALT 31 (H) 06/10/2021   PROT 6.4 06/10/2021   ALBUMIN 4.0 05/21/2020   CALCIUM 9.4 06/10/2021   ANIONGAP 12 02/22/2020   EGFR 92 06/10/2021   GFR 74.39 05/21/2020   Lab Results  Component Value Date   CHOL 128 06/07/2019   Lab  Results  Component Value Date   HDL 52 06/07/2019   Lab Results  Component Value Date   LDLCALC 51 06/07/2019   Lab Results  Component Value Date   TRIG 97 11/07/2019   Lab Results  Component Value Date   CHOLHDL 2.5 06/07/2019   Lab Results  Component Value Date   HGBA1C 5.5 03/18/2021      Assessment & Plan:  Pernicious Anemia - Patient tolerated injection well without complications. Patient advised to schedule next injection 30 days from today.    Problem List Items Addressed This Visit     Pernicious anemia - Primary    Meds ordered this encounter  Medications   cyanocobalamin ((VITAMIN B-12)) injection 1,000 mcg    Follow-up: Return in about 4 weeks (around 07/24/2021) for B12 injection. Durene Romans, Monico Blitz, [place]

## 2021-06-26 NOTE — Progress Notes (Signed)
Agree with documentation as above.   Ewel Lona, MD  

## 2021-06-30 ENCOUNTER — Other Ambulatory Visit: Payer: Self-pay | Admitting: Family Medicine

## 2021-07-02 ENCOUNTER — Telehealth: Payer: Self-pay | Admitting: Family Medicine

## 2021-07-02 ENCOUNTER — Ambulatory Visit (INDEPENDENT_AMBULATORY_CARE_PROVIDER_SITE_OTHER): Payer: Medicare Other | Admitting: Physician Assistant

## 2021-07-02 ENCOUNTER — Other Ambulatory Visit: Payer: Self-pay

## 2021-07-02 ENCOUNTER — Encounter: Payer: Self-pay | Admitting: Physician Assistant

## 2021-07-02 ENCOUNTER — Encounter

## 2021-07-02 VITALS — BP 127/75 | HR 58 | Ht 66.0 in | Wt 172.0 lb

## 2021-07-02 DIAGNOSIS — M0600 Rheumatoid arthritis without rheumatoid factor, unspecified site: Secondary | ICD-10-CM | POA: Diagnosis not present

## 2021-07-02 DIAGNOSIS — Z780 Asymptomatic menopausal state: Secondary | ICD-10-CM

## 2021-07-02 DIAGNOSIS — N3010 Interstitial cystitis (chronic) without hematuria: Secondary | ICD-10-CM

## 2021-07-02 DIAGNOSIS — Z8719 Personal history of other diseases of the digestive system: Secondary | ICD-10-CM

## 2021-07-02 DIAGNOSIS — M5136 Other intervertebral disc degeneration, lumbar region: Secondary | ICD-10-CM

## 2021-07-02 DIAGNOSIS — M1120 Other chondrocalcinosis, unspecified site: Secondary | ICD-10-CM

## 2021-07-02 DIAGNOSIS — M818 Other osteoporosis without current pathological fracture: Secondary | ICD-10-CM

## 2021-07-02 DIAGNOSIS — M5030 Other cervical disc degeneration, unspecified cervical region: Secondary | ICD-10-CM

## 2021-07-02 DIAGNOSIS — Z8679 Personal history of other diseases of the circulatory system: Secondary | ICD-10-CM

## 2021-07-02 DIAGNOSIS — R7989 Other specified abnormal findings of blood chemistry: Secondary | ICD-10-CM

## 2021-07-02 DIAGNOSIS — M5134 Other intervertebral disc degeneration, thoracic region: Secondary | ICD-10-CM

## 2021-07-02 DIAGNOSIS — G5601 Carpal tunnel syndrome, right upper limb: Secondary | ICD-10-CM

## 2021-07-02 DIAGNOSIS — M1712 Unilateral primary osteoarthritis, left knee: Secondary | ICD-10-CM | POA: Diagnosis not present

## 2021-07-02 DIAGNOSIS — Z8619 Personal history of other infectious and parasitic diseases: Secondary | ICD-10-CM

## 2021-07-02 DIAGNOSIS — Z79899 Other long term (current) drug therapy: Secondary | ICD-10-CM | POA: Diagnosis not present

## 2021-07-02 DIAGNOSIS — I7300 Raynaud's syndrome without gangrene: Secondary | ICD-10-CM | POA: Diagnosis not present

## 2021-07-02 DIAGNOSIS — Z96651 Presence of right artificial knee joint: Secondary | ICD-10-CM

## 2021-07-02 DIAGNOSIS — Z9889 Other specified postprocedural states: Secondary | ICD-10-CM

## 2021-07-02 DIAGNOSIS — M51369 Other intervertebral disc degeneration, lumbar region without mention of lumbar back pain or lower extremity pain: Secondary | ICD-10-CM

## 2021-07-02 DIAGNOSIS — R5382 Chronic fatigue, unspecified: Secondary | ICD-10-CM

## 2021-07-02 DIAGNOSIS — M797 Fibromyalgia: Secondary | ICD-10-CM

## 2021-07-02 DIAGNOSIS — Z8781 Personal history of (healed) traumatic fracture: Secondary | ICD-10-CM

## 2021-07-02 NOTE — Patient Instructions (Signed)
Standing Labs We placed an order today for your standing lab work.   Please have your standing labs drawn in November and every 3 months   If possible, please have your labs drawn 2 weeks prior to your appointment so that the provider can discuss your results at your appointment.  Please note that you may see your imaging and lab results in [place] before we have reviewed them. We may be awaiting multiple results to interpret others before contacting you. Please allow our office up to 72 hours to thoroughly review all of the results before contacting the office for clarification of your results.  We have open lab daily: Monday through Thursday from 1:30-4:30 PM and Friday from 1:30-4:00 PM at the office of Dr. Bo Merino, [facility].   Please be advised, all patients with office appointments requiring lab work will take precedent over walk-in lab work.  If possible, please come for your lab work on Monday and Friday afternoons, as you may experience shorter wait times. The office is located at [address], [place], [place], [place] [address] No appointment is necessary.   Labs are drawn by [facility]. Please bring your co-pay at the time of your lab draw.  You may receive a bill from [facility] for your lab work.  If you wish to have your labs drawn at another location, please call the office 24 hours in advance to send orders.  If you have any questions regarding directions or hours of operation,  please call [phone].   As a reminder, please drink plenty of water prior to coming for your lab work. Thanks!

## 2021-07-02 NOTE — Telephone Encounter (Signed)
Call pt: bone density shows osteoporosis with T score of -2.6. continue Prolia and calcium with Vitamin D

## 2021-07-02 NOTE — Telephone Encounter (Signed)
Pt advised of results/recommendations.  She states that she has been having a lot of stress fractures. She was advised to ask you by Dr. Doran Durand why she is having so many stress fractures. Is there anything else she should be taking to help strengthen her bones. She states she hasn't had any injury/trauma that would cause this.   Will fwd to pcp for recommendations.

## 2021-07-03 DIAGNOSIS — M25512 Pain in left shoulder: Secondary | ICD-10-CM | POA: Diagnosis not present

## 2021-07-03 DIAGNOSIS — R6889 Other general symptoms and signs: Secondary | ICD-10-CM | POA: Diagnosis not present

## 2021-07-03 MED ORDER — LANSOPRAZOLE 30 MG CAP, DELAYED RELEASE
30 mg | ORAL_CAPSULE | ORAL | 3 refills | Status: AC
Start: 2021-07-03 — End: ?

## 2021-07-03 NOTE — Telephone Encounter (Signed)
LVM w/Dr. Gardiner Ramus recommendations and asked that pt return call to let us know if she is ok with the referral to ENDO in [place].

## 2021-07-03 NOTE — Telephone Encounter (Signed)
That is a great question.  There is a new option on the market instead of Prolia but it is only being prescribed by endocrinology at this point.  I think may be a consultation with endocrinology to see if they would recommend making any changes or whether or not this newer medication would be of benefit for you would be a great idea.  We have a great endocrinologist in the area over at her [name] location called Dr. Rosina Lowenstein her.  She is really great.  If she is okay with referral then please let me know.

## 2021-07-04 ENCOUNTER — Telehealth: Payer: Self-pay

## 2021-07-04 NOTE — Progress Notes (Signed)
Chronic Care Management Pharmacy Assistant   Name: MARCHE GRANIER  MRN: [id number] DOB: Jan 29, 1951  Mia [name] is an 70 y.o. year old female who presents for his initial CCM visit with the clinical pharmacist.   Recent office visits:  06/26/21 Morrie Sheldon MD - Seen for Pernicious anemia - Vitamin B12 injection given - Follow up in 4 weeks  05/29/21 Joanette Gula MD - Seen for B12 Deficiency - Vitamin B12 injection given - Follow up in 4 weeks 05/01/21 Seen for B12 Deficiency - Vitamin B12 injection given - Follow up in 3 weeks 04/08/21 [name] PA - Seen for B12 Deficiency - Vitamin B12 injection given - Follow up in 3 weeks 03/18/21 Morrie Sheldon MD - Seen for B12 Deficiency - Vitamin B12 injection and prolia injection given - Stopped metformin - Labs ordered - Referral to dermatology - Follow up in 3 weeks  02/26/21 [name] - Seen for medicare annual wellness visit - No medication changes noted - Follow up in 1 year  02/12/21 Morrie Sheldon MD - Seen for osteoporosis - Labs ordered - Vitamin B12 injection given - Follow up in 1 month  01/15/21 Hali Marry MD - Seen for Pernicious anemia - Vitamin B12 injection given - Follow up in 1 month     Recent consult visits:  07/02/21 [name] PA - Rheumatology - Seen for Seronegative rheumatoid arthritis - No medication changes noted -  Follow up in 5 months  06/25/21 [name] PA - [facility] - Seen for Greater tronchanteric bursitis - Started predniSONE 10 MG tablet  [id number] 6 tablets (60 mg) by mouth in the morning for 2 days, THEN 5 tablets (50 mg) in the morning for 2 days, THEN 4 tablets (40 mg) in the morning for 2 days, THEN 3 tablets (30 mg) in the morning for 2 days, THEN 2 tablets (20 mg) in the morning for 2 days, THEN 1 tablet (10 mg) in the morning for 2 days - X rays ordered - corticosteroid injection given - No follow up noted  06/18/21 [name] MD - [name] - Seen for  acute pain of left shoulder - No medication changes noted - Follow up in 4-6 weeks  06/05/21 Eusebio Friendly MD - [facility] - Seen for gastroesophageal reflux - Stop Amlodipine Besylate 5 Mg Tablet, and Primidone (Mysoline) 50 Mg Tablet. Start pantoprazole, Lomotil, and ferrous sulfate 325 mg - Follow up in 6 months 05/23/21 [name] MD - [name] - Seen for Surgery aftercare - No medication changes - Follow up in 1 month 05/19/21 - Steffanie Rainwater Physical therapist - [facility] - Seen for closed fracture of fifth metatarsal bone - No notes available  05/01/21 [name] PA - [facility] - Seen for aftercare following surgery - No medication changes - No follow up noted  04/22/21 [name] PA - [facility] - Seen for aftercare following surgery - Started tiZANidine (Zanaflex) 4 MG capsule, [id number] 1 capsule (4 mg) by mouth if needed at bedtime for muscle spasms - follow up in 3- 4 weeks  04/15/21 [name] - Sports medicine - Seen for shoulder arthroscopy - Notes not available  04/14/21 [name] - Orthopedic surgery - Seen for pain in right foot - Notes not available  04/07/21 [name] - Dermatology - Seen for carcinoma in situ of skin - No notes available  03/31/21 [name] PA - Seen for pain in left  foot - No notes available  03/28/21 Eusebio Friendly - Gastroenterology - Seen for immunization - No notes available  03/25/21 [name] PA  - Seen for unspecified nonsuppurative otitis media - No notes available  03/20/21 Eusebio Friendly - Gastroenterology - Seen for iron deficiency - No notes available  03/19/21 [name] MD - [name] - Seen for chronic left shoulder pain - No medication changes noted - No follow up noted  03/13/21 [name] MD - [name] - Seen for pain in left shoulder - No notes available  03/06/21 Eusebio Friendly - Radiology department - Seen for elevated liver function tests - Injections given  fentaNYL citrate 25 mcg and 50 mcg, lidocaine 2%, ondansetron 4 mg - Given 1 dose of LORazepam 1 mg tablet while in office - Follow up not noted  03/04/21 Marion Givens NP - Neurology - Seen for fibromyalgia - No medication changes - Follow up in 6  months  03/03/21 [name] MD - [name] - Seen for Left shoulder pain - no notes available  02/25/21 Eusebio Friendly - Gastroenterology - Immunization encounter - No notes available  02/13/21 Nathaniel Man MD - [name] - Seen for cervical spine pain - No medication changes - Follow up in 6 months  02/07/21 Luanna Cole - [facility] - Seen for pain in right foot - No notes available  02/06/21 Theadora Rama B Daubert - Seen for Radiculopathy - No notes available  02/05/21 Eusebio Friendly - Radiology department - Seen for elevated liver function tests - No medication changes noted - No follow up noted  01/29/21 Bo Merino MD - Rheumatology - Seen for seronegative rheumatoid arthritis - No medication changes - Follow up in 5 months  01/29/21 Eusebio Friendly - Gastroenterology - Start Lomotil once a day - changes pantoprazole from 40 mg to 20 mg, 2 times daily. Modified colestipol to 1 g tablet 2 times daily - Follow up in 4 months  01/23/21 Dylan Plocki PA - [facility] - Seen for pain in right foot - No notes available      Hospital visits:  None in previous 6 months  Medications: Outpatient Encounter Medications as of 07/04/2021  Medication Sig   amLODipine (NORVASC) 5 MG tablet Take 1 tablet (5 mg total) by mouth daily.   colestipol (COLESTID) 1 g tablet Take 1 g by mouth 2 (two) times daily.   Cyanocobalamin (VITAMIN B-12 IJ) Inject 1 mL as directed every 30 (thirty) days.    denosumab (PROLIA) 60 MG/ML SOLN injection Inject 60 mg into the skin every 6 (six) months. Administer in upper arm, thigh, or abdomen   donepezil (ARICEPT) 10 MG tablet TAKE 1 TABLET AT BEDTIME   gabapentin (NEURONTIN) 300 MG capsule TAKE 1 TO 2 CAPSULES BY MOUTH UP TO  TWO TIMES DAILY AS NEEDED   ibuprofen (ADVIL) 600 MG tablet ibuprofen 600 mg tablet  TAKE 1 TABLET BY MOUTH EVERY 6 HOURS AS NEEDED   ipratropium (ATROVENT) 0.03 % nasal spray Place 2 sprays into both nostrils every 12 (twelve) hours.   leflunomide (ARAVA) 20 MG tablet TAKE ONE TABLET BY MOUTH DAILY   losartan (COZAAR) 50 MG tablet Take 2 tablets (100 mg total) by mouth daily. Take 1 tablet in the morning and 1 tablet in evening as needed for blood pressure greater than 150   Melatonin 10 MG CAPS Take 10 capsules by mouth daily.   memantine (NAMENDA) 10 MG tablet Take 1 tablet (10 mg total) by mouth 2 (two) times  daily.   metoprolol tartrate (LOPRESSOR) 100 MG tablet TAKE 1 AND 1/2 TABLETS TWICE DAILY   ondansetron (ZOFRAN-ODT) 8 MG disintegrating tablet Take 1 tablet by mouth as needed for nausea.   pantoprazole (PROTONIX) 40 MG tablet Take 40 mg by mouth 2 (two) times daily.   polyethylene glycol powder (GLYCOLAX/MIRALAX) 17 GM/SCOOP powder Take 17 g by mouth daily as needed for mild constipation or moderate constipation.    primidone (MYSOLINE) 50 MG tablet TAKE 1 TABLET AT BEDTIME   venlafaxine XR (EFFEXOR-XR) 75 MG 24 hr capsule TAKE 1 CAPSULE EVERY DAY WITH BREAKFAST   No facility-administered encounter medications on file as of 07/04/2021.    Care Gaps: Zoster Vaccines- Shingrix  OPHTHALMOLOGY EXAM  COVID-19 Vaccine INFLUENZA VACCINE  DEXA SCAN    amLODipine (NORVASC) 5 MG tablet (Expired) - Last filled 06/20/20 90 DS colestipol (COLESTID) 1 g tablet- Last filled 02/26/21 DS unknown  Cyanocobalamin (VITAMIN B-12 IJ)- Last filled 01/15/21 denosumab (PROLIA) 60 MG/ML SOLN injection- Last filled 03/18/21 donepezil (ARICEPT) 10 MG tablet- Last filled 07/16/20 90 DS gabapentin (NEURONTIN) 300 MG capsule- Last filled 10/24/19 90 DS ibuprofen (ADVIL) 600 MG tablet- Last filled 02/26/21 DS unknown  ipratropium (ATROVENT) 0.03 % nasal spray- Last filled 12/11/20 leflunomide (ARAVA) 20 MG  tablet- Last filled 05/06/21  90 DS losartan (COZAAR) 50 MG tablet- Last filled 10/09/20 90 DS Melatonin 10 MG CAPS- Last filled 02/22/20 DS unknown  memantine (NAMENDA) 10 MG tablet- Last filled 06/02/21 90 DS metoprolol tartrate (LOPRESSOR) 100 MG tablet- Last filled 03/26/21 0 DS ondansetron (ZOFRAN-ODT) 8 MG disintegrating tablet- Last filled 05/22/20 DS unknown  pantoprazole (PROTONIX) 40 MG tablet- Last filled 06/18/17 DS unknown  polyethylene glycol powder (GLYCOLAX/MIRALAX) 17 GM/SCOOP powder- Last filled 05/10/18 DS unknown  primidone (MYSOLINE) 50 MG tablet- Last filled 07/01/21 9DS venlafaxine XR (EFFEXOR-XR) 75 MG 24 hr capsule- Last filled 02/06/21 90 DS   [facility]: losartan (COZAAR) 50 MG tablet- Last filled 10/09/20 90 DS   Andee Poles, CMA

## 2021-07-07 ENCOUNTER — Inpatient Hospital Stay: Admit: 2021-07-07 | Payer: MEDICARE | Primary: Internal Medicine

## 2021-07-07 ENCOUNTER — Ambulatory Visit: Admit: 2021-07-07 | Discharge: 2021-07-07 | Payer: MEDICARE | Primary: Internal Medicine

## 2021-07-07 DIAGNOSIS — Z09 Encounter for follow-up examination after completed treatment for conditions other than malignant neoplasm: Secondary | ICD-10-CM

## 2021-07-07 LAB — METABOLIC PANEL, COMPREHENSIVE
A-G Ratio: 1 (ref 0.8–1.7)
ALT (SGPT): 24 U/L (ref 13–56)
AST (SGOT): 19 U/L (ref 10–38)
Albumin: 3.5 g/dL (ref 3.4–5.0)
Alk. phosphatase: 73 U/L (ref 45–117)
Anion gap: 4 mmol/L (ref 3.0–18)
BUN/Creatinine ratio: 13 (ref 12–20)
BUN: 9 MG/DL (ref 7.0–18)
Bilirubin, total: 0.3 MG/DL (ref 0.2–1.0)
CO2: 28 mmol/L (ref 21–32)
Calcium: 8.7 MG/DL (ref 8.5–10.1)
Chloride: 108 mmol/L (ref 100–111)
Creatinine: 0.72 MG/DL (ref 0.6–1.3)
GFR est AA: >60 mL/min/{1.73_m2} (ref >60)
GFR est non-AA: >60 mL/min/{1.73_m2} (ref >60)
Globulin: 3.4 g/dL (ref 2.0–4.0)
Glucose: 110 mg/dL — ABNORMAL HIGH (ref 74–99)
Potassium: 4.1 mmol/L (ref 3.5–5.5)
Protein, total: 6.9 g/dL (ref 6.4–8.2)
Sodium: 140 mmol/L (ref 136–145)

## 2021-07-07 LAB — CBC W/O DIFF
ABSOLUTE NRBC: 0 10*3/uL (ref 0.00–0.01)
HCT: 39.6 % (ref 35.0–45.0)
HGB: 12 g/dL (ref 12.0–16.0)
MCH: 29.5 PG (ref 24.0–34.0)
MCHC: 30.3 g/dL — ABNORMAL LOW (ref 31.0–37.0)
MCV: 97.3 FL (ref 78.0–100.0)
MPV: 9.6 FL (ref 9.2–11.8)
NRBC: 0 PER 100 WBC
PLATELET: 184 10*3/uL (ref 135–420)
RBC: 4.07 M/uL — ABNORMAL LOW (ref 4.20–5.30)
RDW: 13.7 % (ref 11.6–14.5)
WBC: 5.6 10*3/uL (ref 4.6–13.2)

## 2021-07-07 LAB — LIPID PANEL
CHOL/HDL Ratio: 2.9 (ref 0–5.0)
Chol/HDL Ratio: 2.9 (ref 0–5.0)
Cholesterol, Total: 128 MG/DL (ref <200)
Cholesterol, total: 128 MG/DL (ref <200)
HDL Cholesterol: 44 MG/DL (ref 40–60)
HDL: 44 MG/DL (ref 40–60)
LDL Calculated: 58.2 MG/DL (ref 0–100)
LDL, calculated: 58.2 MG/DL (ref 0–100)
Triglyceride: 129 MG/DL (ref <150)
Triglycerides: 129 MG/DL (ref <150)
VLDL Cholesterol Calculated: 25.8 MG/DL
VLDL, calculated: 25.8 MG/DL

## 2021-07-07 LAB — CBC
Hematocrit: 39.6 % (ref 35.0–45.0)
Hemoglobin: 12 g/dL (ref 12.0–16.0)
MCH: 29.5 PG (ref 24.0–34.0)
MCHC: 30.3 g/dL — ABNORMAL LOW (ref 31.0–37.0)
MCV: 97.3 FL (ref 78.0–100.0)
MPV: 9.6 FL (ref 9.2–11.8)
NRBC Absolute: 0 10*3/uL (ref 0.00–0.01)
Nucleated RBCs: 0 PER 100 WBC
Platelets: 184 10*3/uL (ref 135–420)
RBC: 4.07 M/uL — ABNORMAL LOW (ref 4.20–5.30)
RDW: 13.7 % (ref 11.6–14.5)
WBC: 5.6 10*3/uL (ref 4.6–13.2)

## 2021-07-07 LAB — COMPREHENSIVE METABOLIC PANEL
ALT: 24 U/L (ref 13–56)
AST: 19 U/L (ref 10–38)
Albumin/Globulin Ratio: 1 (ref 0.8–1.7)
Albumin: 3.5 g/dL (ref 3.4–5.0)
Alkaline Phosphatase: 73 U/L (ref 45–117)
Anion Gap: 4 mmol/L (ref 3.0–18)
BUN: 9 MG/DL (ref 7.0–18)
Bun/Cre Ratio: 13 (ref 12–20)
CO2: 28 mmol/L (ref 21–32)
Calcium: 8.7 MG/DL (ref 8.5–10.1)
Chloride: 108 mmol/L (ref 100–111)
Creatinine: 0.72 MG/DL (ref 0.6–1.3)
EGFR IF NonAfrican American: >60 mL/min/{1.73_m2} (ref >60)
GFR African American: >60 mL/min/{1.73_m2} (ref >60)
Globulin: 3.4 g/dL (ref 2.0–4.0)
Glucose: 110 mg/dL — ABNORMAL HIGH (ref 74–99)
Potassium: 4.1 mmol/L (ref 3.5–5.5)
Sodium: 140 mmol/L (ref 136–145)
Total Bilirubin: 0.3 MG/DL (ref 0.2–1.0)
Total Protein: 6.9 g/dL (ref 6.4–8.2)

## 2021-07-08 LAB — HEMOGLOBIN A1C W/O EAG
Hemoglobin A1C: 5.8 % — ABNORMAL HIGH (ref 4.2–5.6)
Hemoglobin A1c: 5.8 % — ABNORMAL HIGH (ref 4.2–5.6)

## 2021-07-09 ENCOUNTER — Other Ambulatory Visit: Payer: Self-pay

## 2021-07-09 ENCOUNTER — Ambulatory Visit (INDEPENDENT_AMBULATORY_CARE_PROVIDER_SITE_OTHER): Payer: Medicare Other | Admitting: Pharmacist

## 2021-07-09 DIAGNOSIS — M818 Other osteoporosis without current pathological fracture: Secondary | ICD-10-CM

## 2021-07-09 DIAGNOSIS — R7301 Impaired fasting glucose: Secondary | ICD-10-CM

## 2021-07-09 DIAGNOSIS — I10 Essential (primary) hypertension: Secondary | ICD-10-CM

## 2021-07-09 DIAGNOSIS — Z0000 Encounter for general adult medical examination without abnormal findings: Secondary | ICD-10-CM

## 2021-07-09 DIAGNOSIS — Z780 Asymptomatic menopausal state: Secondary | ICD-10-CM

## 2021-07-09 NOTE — Telephone Encounter (Signed)
LVM for pt to return call re: decision to see ENDO or not.  Charyl Bigger, CMA

## 2021-07-09 NOTE — Patient Instructions (Signed)
Visit Information   PATIENT GOALS:   Goals Addressed             This Visit's Progress    Medication Management       Patient Goals/Self-Care Activities Over the next 90 days, patient will:  take medications as prescribed and focus on medication adherence by setting an alarm for 9am and 9pm as we discussed  Follow Up Plan: Telephone follow up appointment with care management team member scheduled for:  3 months         Consent to CCM Services: [name] was given information about Chronic Care Management services including:  CCM service includes personalized support from designated clinical staff supervised by her physician, including individualized plan of care and coordination with other care providers 24/7 contact phone numbers for assistance for urgent and routine care needs. Service will only be billed when office clinical staff spend 20 minutes or more in a month to coordinate care. Only one practitioner may furnish and bill the service in a calendar month. The patient may stop CCM services at any time (effective at the end of the month) by phone call to the office staff. The patient will be responsible for cost sharing (co-pay) of up to 20% of the service fee (after annual deductible is met).  Patient agreed to services and verbal consent obtained.   Patient verbalizes understanding of instructions provided today and agrees to view in Mia [name].   Telephone follow up appointment with care management team member scheduled for: 3 months  [place]: Patient Care Plan: Medication Management     Problem Identified: HTN, IFG, osteoporosis      Long-Range Goal: Disease Progression Prevention   Start Date: 07/09/2021  This Visit's Progress: On track  Priority: High  Note:   Current Barriers:  Occasionally forgets to take medication  Pharmacist Clinical Goal(s):  Over the next 90 days, patient will adhere to plan to optimize therapeutic  regimen for chronic conditions as evidenced by report of adherence to recommended medication management changes through collaboration with PharmD and provider.   Interventions: 1:1 collaboration with Hali Marry, MD regarding development and update of comprehensive plan of care as evidenced by provider attestation and co-signature Inter-disciplinary care team collaboration (see longitudinal plan of care) Comprehensive medication review performed; medication list updated in electronic medical record  Impaired Fasting Glucose:  Controlled; current treatment:lifestyle modifications only; a1c 5.5. Hx of DM in mother & grandmother. Has taken metformin in previous years  Current glucose readings: not currently checking  Denies/reports hypoglycemic/hyperglycemic symptoms  Current meal patterns: breakfast: 1/2 bagel; & cup of coffee; lunch: peaches or candy bar/snack; dinner: chicken or pasta, sometimes just a cup of coffee; snacks: pork rinds, nuts, potato chips; drinks: coke zero (2-3 per day),   Current exercise: limited by shoulder surgery, taking it easy recovering  Counseled on diet/lifestyle options to continue preventing progression in to diabetes Recommended continue aiming for carb/sugars in moderation,  Hypertension:  Controlled; current treatment:losartan 35m BID, amlodipine 570m metoprolol 15062mID;   Current home readings: not currently checking  Reports occasional hypertensive symptoms, unclear if related to BP or not  Counseled on importance of checking BP at home since already has a monitor and may have possible symptoms Recommended continue current regimen Osteoporosis:   Current treatment: prolia Q6mo75month DEXA/ t-score: 06/07/2019, - 2.6  Supplement: not currently taking  Recommended calcium + vitamin D supplement  Patient Goals/Self-Care Activities Over the  next 90 days, patient will:  take medications as prescribed and focus on medication adherence by setting  an alarm for 9am and 9pm as we discussed  Follow Up Plan: Telephone follow up appointment with care management team member scheduled for:  3 months

## 2021-07-09 NOTE — Addendum Note (Signed)
Addended by: Teddy Spike on: 07/09/2021 03:34 PM   Modules accepted: Orders

## 2021-07-09 NOTE — Progress Notes (Signed)
Chronic Care Management Pharmacy Note  07/09/2021 Name:  [name] MRN:  [id number] DOB:  1951/05/18  Summary: addressed HTN, impaired fasting glucose, and osteoporosis. Patient reports difficulty with remembering to take medicines.  Recommendations/Changes made from today's visit: Recommended she set an alarm to aid in remembering to take medications, after discussing her schedule, she will do 9am and 9am on her watch.   Plan: f/u with pharmacist in 3 months for adherence  Subjective: Mia [name] is an 70 y.o. year old female who is a primary patient of Mia [name], Mia Kocher, MD.  The CCM team was consulted for assistance with disease management and care coordination needs.    Engaged with patient by telephone for initial visit in response to provider referral for pharmacy case management and/or care coordination services.   Consent to Services:  The patient was given information about Chronic Care Management services, agreed to services, and gave verbal consent prior to initiation of services.  Please see initial visit note for detailed documentation.   Patient Care Team: Mia Marry, MD as PCP - General (Family Medicine) Mia, Wonda Cheng, MD as PCP - Cardiology (Cardiology) Mia Decamp, MD as Consulting Physician (Sports Medicine) Bo Merino, MD as Consulting Physician (Rheumatology) Mia Clines, MD (Inactive) as Consulting Physician (Urology) Mia Gobble, MD as Consulting Physician (Pulmonary Disease) Mia Campbell, MD as Consulting Physician (Gastroenterology) Mia Rhine Clent Demark, MD as Consulting Physician (Ophthalmology) Dr. Floyde [name] as Referring Physician (Neurology) Daubert, Marlise Eves, MD as Consulting Physician (Orthopedic Surgery) Eusebio Friendly, MD as Referring Physician (Internal Medicine) Mia [name], [facility] as Pharmacist (Pharmacist)  Recent office visits:  06/26/21 Morrie Sheldon MD - Seen for Pernicious anemia -  Vitamin B12 injection given - Follow up in 4 weeks  05/29/21 Joanette Gula MD - Seen for B12 Deficiency - Vitamin B12 injection given - Follow up in 4 weeks 05/01/21 Seen for B12 Deficiency - Vitamin B12 injection given - Follow up in 3 weeks 04/08/21 [name] PA - Seen for B12 Deficiency - Vitamin B12 injection given - Follow up in 3 weeks 03/18/21 Morrie Sheldon MD - Seen for B12 Deficiency - Vitamin B12 injection and prolia injection given - Stopped metformin - Labs ordered - Referral to dermatology - Follow up in 3 weeks  02/26/21 [name] - Seen for medicare annual wellness visit - No medication changes noted - Follow up in 1 year  02/12/21 Morrie Sheldon MD - Seen for osteoporosis - Labs ordered - Vitamin B12 injection given - Follow up in 1 month  01/15/21 Mia Marry MD - Seen for Pernicious anemia - Vitamin B12 injection given - Follow up in 1 month        Recent consult visits:  07/02/21 Mia Neas PA - Rheumatology - Seen for Seronegative rheumatoid arthritis - No medication changes noted -  Follow up in 5 months  06/25/21 Mia Peer PA - [facility] - Seen for Greater tronchanteric bursitis - Started predniSONE 10 MG tablet  WUJ:WJXB 6 tablets (60 mg) by mouth in the morning for 2 days, THEN 5 tablets (50 mg) in the morning for 2 days, THEN 4 tablets (40 mg) in the morning for 2 days, THEN 3 tablets (30 mg) in the morning for 2 days, THEN 2 tablets (20 mg) in the morning for 2 days, THEN 1 tablet (10 mg) in the morning for 2 days - X rays ordered - corticosteroid injection given -  No follow up noted  06/18/21 Mia Loser MD - [place] for acute pain of left shoulder - No medication changes noted - Follow up in 4-6 weeks  06/05/21 Eusebio Friendly MD - [facility] - Seen for gastroesophageal reflux - Stop Amlodipine Besylate 5 Mg Tablet, and Primidone (Mysoline) 50 Mg Tablet. Start pantoprazole, Lomotil, and ferrous sulfate 325  mg - Follow up in 6 months 05/23/21 Mia Loser MD - Mia [name] - Seen for Surgery aftercare - No medication changes - Follow up in 1 month 05/19/21 - Steffanie Rainwater Physical therapist - [facility] - Seen for closed fracture of fifth metatarsal bone - No notes available  05/01/21 Mia Peer PA - [facility] - Seen for aftercare following surgery - No medication changes - No follow up noted  04/22/21 Mia Peer PA - [facility] - Seen for aftercare following surgery - Started tiZANidine (Zanaflex) 4 MG capsule, UDJ:SHFW 1 capsule (4 mg) by mouth if needed at bedtime for muscle spasms - follow up in 3- 4 weeks  04/15/21 Mia [name] - Sports medicine - Seen for shoulder arthroscopy - Notes not available  04/14/21 Mia [name] - Orthopedic surgery - Seen for pain in right foot - Notes not available  04/07/21 Mia [name] - Dermatology - Seen for carcinoma in situ of skin - No notes available  03/31/21 Mia Nixon PA - Seen for pain in left foot - No notes available  03/28/21 Eusebio Friendly - Gastroenterology - Seen for immunization - No notes available  03/25/21 Mia Cage PA  - Seen for unspecified nonsuppurative otitis media - No notes available  03/20/21 Eusebio Friendly - Gastroenterology - Seen for iron deficiency - No notes available  03/19/21 Mia Loser MD - Mia [name] - Seen for chronic left shoulder pain - No medication changes noted - No follow up noted  03/13/21 Mia Loser MD - Mia [name] - Seen for pain in left shoulder - No notes available  03/06/21 Eusebio Friendly - Radiology department - Seen for elevated liver function tests - Injections given fentaNYL citrate 25 mcg and 50 mcg, lidocaine 2%, ondansetron 4 mg - Given 1 dose of LORazepam 1 mg tablet while in office - Follow up not noted  03/04/21 Barz Givens NP - Neurology - Seen for fibromyalgia - No medication changes - Follow up in 6  months  03/03/21 Mia Loser MD - Mia [name] - Seen for  Left shoulder pain - no notes available  02/25/21 Eusebio Friendly - Gastroenterology - Immunization encounter - No notes available  02/13/21 Mia Man MD - Mia [name] - Seen for cervical spine pain - No medication changes - Follow up in 6 months  02/07/21 Mia [name] - [facility] - Seen for pain in right foot - No notes available  02/06/21 Theadora Rama B Daubert - Seen for Radiculopathy - No notes available  02/05/21 Eusebio Friendly - Radiology department - Seen for elevated liver function tests - No medication changes noted - No follow up noted  01/29/21 Bo Merino MD - Rheumatology - Seen for seronegative rheumatoid arthritis - No medication changes - Follow up in 5 months  01/29/21 Eusebio Friendly - Gastroenterology - Start Lomotil once a day - changes pantoprazole from 40 mg to 20 mg, 2 times daily. Modified colestipol to 1 g tablet 2 times daily - Follow up in 4 months  01/23/21 Dylan Plocki PA - [facility] - Seen for pain in right foot - No notes available  Hospital visits:  None in previous 6 months  Objective:  Lab Results  Component Value Date   CREATININE 0.71 06/10/2021   CREATININE 0.75 03/10/2021   CREATININE 0.73 02/12/2021    Lab Results  Component Value Date   HGBA1C 5.5 03/18/2021   Last diabetic Eye exam:  Lab Results  Component Value Date/Time   HMDIABEYEEXA No Retinopathy 04/11/2019 12:00 AM    Last diabetic Foot exam: No results found for: HMDIABFOOTEX      Component Value Date/Time   CHOL 128 06/07/2019 0934   TRIG 97 11/07/2019 0004   HDL 52 06/07/2019 0934   CHOLHDL 2.5 06/07/2019 0934   CHOLHDL 3.2 08/28/2016 1558   VLDL 22 08/28/2016 1558   LDLCALC 51 06/07/2019 0934    Hepatic Function Latest Ref Rng & Units 06/10/2021 03/10/2021 02/12/2021  Total Protein 6.1 - 8.1 g/dL 6.4 6.3 6.4  Albumin 3.5 - 5.2 g/dL - - -  AST 10 - 35 U/L 34 22 372(H)  ALT 6 - 29 U/L 31(H) 21 234(H)  Alk Phosphatase 39 - 117 U/L - - -  Total Bilirubin 0.2 -  1.2 mg/dL 0.4 0.2 1.3(H)  Bilirubin, Direct 0.0 - 0.2 mg/dL - - -    Lab Results  Component Value Date/Time   TSH 1.34 05/21/2020 08:19 AM   TSH 1.43 09/12/2019 11:44 AM   FREET4 0.96 08/31/2010 01:08 AM    CBC Latest Ref Rng & Units 06/10/2021 06/02/2021 12/09/2020  WBC 3.8 - 10.8 Thousand/uL 5.1 5.9 7.7  Hemoglobin 11.7 - 15.5 g/dL 12.1 13.0 12.0  Hematocrit 35.0 - 45.0 % 39.4 41.3 37.2  Platelets 140 - 400 Thousand/uL 334 377 394    Lab Results  Component Value Date/Time   VD25OH 76 09/09/2015 02:57 PM    Social History   Tobacco Use  Smoking Status Never  Smokeless Tobacco Never   BP Readings from Last 3 Encounters:  07/02/21 127/75  06/26/21 140/66  05/29/21 118/75   Pulse Readings from Last 3 Encounters:  07/02/21 (!) 58  06/26/21 65  05/29/21 61   Wt Readings from Last 3 Encounters:  07/02/21 172 lb (78 kg)  04/08/21 165 lb (74.8 kg)  03/18/21 165 lb 6.4 oz (75 kg)    Assessment: Review of patient past medical history, allergies, medications, health status, including review of consultants reports, laboratory and other test data, was performed as part of comprehensive evaluation and provision of chronic care management services.   SDOH:  (Social Determinants of Health) assessments and interventions performed:    CCM Care Plan  Allergies  Allergen Reactions   Codeine Nausea And Vomiting   Myrbetriq  [Mirabegron] Other (See Comments)    SEVERE HEADACHE   Percocet [Oxycodone-Acetaminophen] Nausea And Vomiting   Propoxyphene Nausea And Vomiting   Nystatin    Celebrex [Celecoxib] Other (See Comments)    Other reaction(s): Other flushed   Darvocet [Propoxyphene N-Acetaminophen] Nausea And Vomiting   Erythromycin Nausea And Vomiting    Nausea and vomiting    Hydrocodone Nausea And Vomiting   Lyrica [Pregabalin] Swelling    Legs swelling    Macrodantin [Nitrofurantoin] Nausea And Vomiting   Toradol [Ketorolac Tromethamine] Nausea And Vomiting    Per  patient, only PO form causes nausea and vomiting. Can take injection without issue   Tramadol Nausea And Vomiting   Verapamil Nausea And Vomiting    REACTION: intolerance    Medications Reviewed Today     Reviewed by Mia [name], [facility] (Pharmacist)  on 07/09/21 at 0909  Med List Status: <None>   Medication Order Taking? Sig Documenting Provider Last Dose Status Informant  amLODipine (NORVASC) 5 MG tablet [id number]  Take 1 tablet (5 mg total) by mouth daily. Evans Lance, MD  Expired 07/02/21 2359   colestipol (COLESTID) 1 g tablet [id number] Yes Take 1 g by mouth 2 (two) times daily. [provider] Taking Active   Cyanocobalamin (VITAMIN B-12 IJ) [id number] Yes Inject 1 mL as directed every 30 (thirty) days.  [provider] Taking Active Self  denosumab (PROLIA) 60 MG/ML SOLN injection [id number] Yes Inject 60 mg into the skin every 6 (six) months. Administer in upper arm, thigh, or abdomen [provider] Taking Active Self  donepezil (ARICEPT) 10 MG tablet [id number] Yes TAKE 1 TABLET AT BEDTIME Kathrynn Ducking, MD Taking Active   gabapentin (NEURONTIN) 300 MG capsule [id number] Yes TAKE 1 TO 2 CAPSULES BY MOUTH UP TO TWO TIMES DAILY AS NEEDED Mia Marry, MD Taking Active Self  ibuprofen (ADVIL) 600 MG tablet [id number] Yes ibuprofen 600 mg tablet  TAKE 1 TABLET BY MOUTH EVERY 6 HOURS AS NEEDED [provider] Taking Active   ipratropium (ATROVENT) 0.03 % nasal spray [id number] Yes Place 2 sprays into both nostrils every 12 (twelve) hours. [name], PA-C Taking Active   leflunomide (ARAVA) 20 MG tablet [id number] Yes TAKE ONE TABLET BY MOUTH DAILY Mia Neas, PA-C Taking Active   losartan (COZAAR) 50 MG tablet [id number] Yes Take 2 tablets (100 mg total) by mouth daily. Take 1 tablet in the morning and 1 tablet in evening as needed for blood pressure greater than 150 Evans Lance, MD Taking Active   Melatonin 10 MG CAPS [id number]  Yes Take 10 capsules by mouth daily. [provider] Taking Active Self  memantine (NAMENDA) 10 MG tablet [id number] Yes Take 1 tablet (10 mg total) by mouth 2 (two) times daily. Kathrynn Ducking, MD Taking Active   metoprolol tartrate (LOPRESSOR) 100 MG tablet [id number] Yes TAKE 1 AND 1/2 TABLETS TWICE DAILY Mia, Wonda Cheng, MD Taking Active   ondansetron (ZOFRAN-ODT) 8 MG disintegrating tablet [id number] Yes Take 1 tablet by mouth as needed for nausea. [provider] Taking Active   pantoprazole (PROTONIX) 40 MG tablet [id number] Yes Take 40 mg by mouth 2 (two) times daily. [provider] Taking Active Self  polyethylene glycol powder (GLYCOLAX/MIRALAX) 17 GM/SCOOP powder [id number] Yes Take 17 g by mouth daily as needed for mild constipation or moderate constipation.  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   Weakness of both lower extremities 06/11/2020   Hyponatremia 05/22/2020   Gait instability 06/20/2019   Facet arthritis of lumbar region 05/10/2018   MCI (mild cognitive impairment) 11/25/2017   Restrictive lung disease 07/30/2017   Mixed hyperlipidemia 06/18/2017   Rectocele 04/26/2017   Irritable bowel syndrome with both constipation and diarrhea 04/26/2017   Osteoporosis 04/18/2017   Trochanteric bursitis of both hips 02/24/2017   Stress fracture of  left tibia 11/05/2016   Inflammatory arthritis 09/30/2016   High risk medication use 09/30/2016   History of Clostridium difficile colitis 09/30/2016   Seronegative  rheumatoid arthritis 09/30/2016   Pseudogout 09/30/2016   DDD cervical spine status post fusion 09/30/2016   DDD thoracic spine 09/30/2016   H/O total knee replacement, right 09/30/2016   Age-related osteoporosis without current pathological fracture 09/30/2016   Tremor, essential 05/14/2016   Right foot pain 04/14/2016   B12 deficiency 09/10/2015   Syncope 09/10/2015   Cervical facet joint syndrome 09/10/2015   Chronic fatigue 09/10/2015   Aortic atherosclerosis ([place]) 04/01/2015   DOE (dyspnea on exertion)    Primary osteoarthritis of right knee 08/13/2014   Benign head tremor 06/11/2014   Lumbar degenerative disc disease 06/11/2014   IFG (impaired fasting glucose) 03/09/2014   Hemorrhoid 03/09/2014   Retinal wrinkling, right eye 03/09/2014   Fibromyalgia 03/09/2014   GERD (gastroesophageal reflux disease) 03/09/2014   History of arthroplasty of right knee 01/31/2014   Memory difficulty 12/14/2013   Hemorrhoids, external, thrombosed 06/22/2011   Hypertension 03/11/2011   SVT (supraventricular tachycardia) ([place])    Raynaud phenomenon    EDEMA 08/25/2010   Nonspecific (abnormal) findings on radiological and other examination of body structure 08/25/2010   COMPUTERIZED TOMOGRAPHY, CHEST, ABNORMAL 08/25/2010   OSA (obstructive sleep apnea) 04/24/2009   Allergic rhinitis 06/11/2008   Dyspnea 06/11/2008   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   Chronic diastolic CHF (congestive heart failure) ([place]) 06/08/2008   INTERSTITIAL CYSTITIS 06/08/2008    Immunization History  Administered Date(s) Administered   Fluad Quad(high Dose 65+) 07/13/2019, 07/10/2020   Hepatitis B, adult 02/25/2021   Influenza Split 07/26/2012, 06/11/2014, 07/25/2015, 06/23/2018   Influenza Whole 07/30/2009, 08/19/2010   Influenza, High Dose Seasonal PF 07/06/2016, 07/05/2017   Influenza,inj,Quad PF,6+ Mos 06/11/2014, 07/25/2015, 06/23/2018   Influenza-Unspecified 07/26/2013, 09/05/2013,  07/13/2019, 07/10/2020   Moderna Sars-Covid-2 Vaccination 02/29/2020, 03/28/2020, 10/04/2020   Pneumococcal Conjugate-13 01/04/2017   Pneumococcal Polysaccharide-23 07/30/2009, 11/14/2018   Pneumococcal-Unspecified 10/27/2015   Tdap 02/13/2008, 05/12/2018   Zoster, Live 09/01/2011    Conditions to be addressed/monitored: HTN and preDM, osteoporosis  Care Plan : Medication Management  Updates made by Mia [name], [place] since 07/09/2021 12:00 AM     Problem: HTN, IFG, osteoporosis      Long-Range Goal: Disease Progression Prevention   Start Date: 07/09/2021  This Visit's Progress: On track  Priority: High  Note:   Current Barriers:  Occasionally forgets to take medication  Pharmacist Clinical Goal(s):  Over the next 90 days, patient will adhere to plan to optimize therapeutic regimen for chronic conditions as evidenced by report of adherence to recommended medication management changes through collaboration with PharmD and provider.   Interventions: 1:1 collaboration with Mia Marry, MD regarding development and update of comprehensive plan of care as evidenced by provider attestation and co-signature Inter-disciplinary care team collaboration (see longitudinal plan of care) Comprehensive medication review performed; medication list updated in electronic medical record  Impaired Fasting Glucose:  Controlled; current treatment:lifestyle modifications only; a1c 5.5. Hx of DM in mother & grandmother. Has taken metformin in previous years  Current glucose readings: not currently checking  Denies/reports hypoglycemic/hyperglycemic symptoms  Current meal patterns: breakfast: 1/2 bagel; & cup of coffee; lunch: peaches or candy bar/snack; dinner: chicken or pasta, sometimes just a cup of coffee; snacks: pork rinds, nuts, potato chips; drinks: coke zero (2-3 per day),   Current exercise: limited by shoulder surgery, taking it easy recovering  Counseled  on diet/lifestyle  options to continue preventing progression in to diabetes Recommended continue aiming for carb/sugars in moderation,  Hypertension:  Controlled; current treatment:losartan 58m BID, amlodipine 517m metoprolol 15024mID;   Current home readings: not currently checking  Reports occasional hypertensive symptoms, unclear if related to BP or not  Counseled on importance of checking BP at home since already has a monitor and may have possible symptoms Recommended continue current regimen Osteoporosis:   Current treatment: prolia Q6mo48month DEXA/ t-score: 06/07/2019, - 2.6  Supplement: not currently taking  Recommended calcium + vitamin D supplement  Patient Goals/Self-Care Activities Over the next 90 days, patient will:  take medications as prescribed and focus on medication adherence by setting an alarm for 9am and 9pm as we discussed  Follow Up Plan: Telephone follow up appointment with care management team member scheduled for:  3 months      Medication Assistance: None required.  Patient affirms current coverage meets needs.  Patient's preferred pharmacy is:  [id number] [place] - [address] [place] [id number]: [phone] - [place] [id number]: [phone] [id number] [place]- [place] [id number]: [phone] pill box? Yes Pt endorses 60% compliance  Follow Up:  Patient agrees to Care Plan and Follow-up.  Plan: Telephone follow up appointment with care management team member scheduled for:  3 months  KeeshDarius [name]

## 2021-07-09 NOTE — Telephone Encounter (Signed)
Order for endo placed.

## 2021-07-14 ENCOUNTER — Ambulatory Visit: Admit: 2021-07-14 | Discharge: 2021-07-14 | Payer: MEDICARE | Attending: Internal Medicine | Primary: Internal Medicine

## 2021-07-14 ENCOUNTER — Ambulatory Visit: Attending: Internal Medicine | Primary: Internal Medicine

## 2021-07-14 DIAGNOSIS — E119 Type 2 diabetes mellitus without complications: Secondary | ICD-10-CM

## 2021-07-14 NOTE — Progress Notes (Signed)
70 y.o. white female who presents for f/u    She has finished up the chemoradiation, seeing Dr Mariea Clonts.  The neuropathy is doing okay, taking the gaba at night only.  Affects mostly the soles and her legs feel cold    Denied any cardiovascular complaints. Trying to be active but no set exercise,  she has noted more afternoon ankle swelling without pnd, orthopnea.  She is known to have venous disease    No gi or gu issues .  Still seeing Dr Sheral Flow in follow up    Denies polyuria, polydipsia, nocturia, vision change.     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 Marisa Hua    DM (diabetes mellitus) ([place]) 10/2017    on basis of fbs>125; intol metformin    Dyslipidemia     Endometrial cancer ([place]) 02/2017    Dr Sheral Flow; stage 1B gr 1 endometroid adenoca w foci squamous diff of the endometrium; RA TLH BSO PLND, 0/13LN, MSI intact    Frozen shoulder     right Dr. 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 Synetta Shadow, Dr. Marisa Hua    Osteopenia     DEXA -0.5 spine, -0.8 hip (1/18); 0.5 spine, -1.3 hip (3/22)    Overweight (BMI 25.0-29.9)     IF 7/18 start weight 168 lbs not doing     Painless hematuria 04/2016    Dr Jimmye Norman, neg eval    Palpitations 2008    neg thallium 2008, nl holter 2008, echo nl lv/ef 65%/tr mr/dd/nl pasp    Syncope      neurocardiogenic by tilt 1994    Venous insufficiency     Vitamin D deficiency      Past Surgical History:   Procedure Laterality Date    HX BREAST BIOPSY Left 2021    Dr Jennye Boroughs    HX COLONOSCOPY      Dr Rosendo Gros 2007 neg; 04/22/18 neg    HX HEMORRHOIDECTOMY      Dr. Rosendo Gros 2007    HX HYSTERECTOMY  03/11/2017    Dr Sheral Flow    HX TUBAL LIGATION      HX UROLOGICAL  06/2016    Dr Jimmye Norman; bladder bx showed benign lesions     Social History     Socioeconomic History    Marital status: MARRIED     Spouse name: Not on file    Number of children: 2    Years of education: Not on file    Highest education level: Not on file   Occupational History    Occupation: benefits specialist   Tobacco  Use    Smoking status: Never    Smokeless tobacco: Never   Substance and Sexual Activity    Alcohol use: Yes     Alcohol/week: 4.0 standard drinks     Types: 4 Glasses of wine per week    Drug use: No    Sexual activity: Not on file   Other Topics Concern    Not on file   Social History Narrative    Not on file     Social Determinants of Health     Financial Resource Strain: Not on file   Food Insecurity: Not on file   Transportation Needs: Not on file   Physical Activity: Not on file   Stress: Not on file   Social Connections: Not on file   Intimate Partner Violence: Not on file   Housing Stability: Not on file     Current Outpatient Medications   Medication Sig    lansoprazole (PREVACID) 30 mg capsule take 1 capsule by mouth BEFORE BREAKFAST DAILY    citalopram (CELEXA) 10 mg tablet take 1 tablet by mouth once daily    Januvia 100 mg tablet take 1 tablet by mouth once daily    gabapentin (NEURONTIN) 300 mg capsule Take 300 mg by mouth daily.    Vitamin D2 1,250 mcg (50,000 unit) capsule take 1 capsule by mouth EVERY MONTH    atorvastatin (LIPITOR) 40 mg tablet take 1 tablet by mouth once daily    multivitamin (ONE A DAY) tablet Take 1 Tab by mouth daily.    Blood-Glucose Meter monitoring kit Use daily as directed    glucose blood VI  test strips (BLOOD GLUCOSE TEST) strip Use daily as directed    lancets misc Use daily as directed    aspirin 81 mg chewable tablet Take 81 mg by mouth daily.     No current facility-administered medications for this visit.     Allergies   Allergen Reactions    Fish Oil Nausea Only     Patient denies  Other reaction(s): gi distress  Patient denies    Lidocaine Other (comments)     anxious    Metformin Diarrhea    Relafen [Nabumetone] Nausea Only    Thimerosal Hives     REVIEW OF SYSTEMS:  mammo 9/21, colo 6/19 Dr Rosendo Gros, DEXA 1/18 Dr Margaretann Loveless - no vision change or eye pain  Oral - no mouth pain, tongue or tooth problems  Ears - no hearing loss, ear pain, fullness, no swallowing problems  Cardiac - no CP, PND, orthopnea, edema, palpitations or syncope  Chest - no breast masses  Resp - no wheezing, chronic coughing, dyspnea  Urinary - no dysuria, hematuria, flank pain, urgency, frequency    Visit Vitals  BP 115/67   Pulse 78   Temp 97.7 ??F (36.5 ??C) (Temporal)   Resp 16   Ht 5' 4" (1.626 m)   Wt 154 lb (69.9 kg)   SpO2 98%   BMI 26.43 kg/m??     Affect is appropriate.  Mood stable  No apparent distress  HEENT --Anicteric sclerae.  No JVD, or bruits.  Thyroid fullness on left  Lungs --Clear to auscultation and percussion, normal percussion.  Heart --Regular rate and rhythm, no murmurs, rubs, gallops, or clicks.  Abdomen -- Soft and nontender, no hepatosplenomegaly or masses.  Extremities -- Without cyanosis, clubbing, tr ankle edema notd. 2+ pulses equally and bilaterally.  Varicose veins bilat    LABS  From 5/10 showed  gluc 110, cr 0.70,               alt 23,                                   chol 154, tg 143, hdl 45, ldl-c 80,                                                            tsh 1.91  From 5/10 showed                       2 hr GTT 89  From 8/10 showed                                                                vit d 23.0, ck 57, aldo 5.4  From 8/11 showed   gluc 108,                                      hba1c 6.3,                   chol 160, tg 172, hdl 39, ldl-c 87,  wbc 5.,7 hb 12.3, plt 235, ua neg,     tsh 2.28  From 5/12 showed   gluc 113, cr 0.71, gfr 94,  alt 16, hba1c 6.2, ldl-p 1989, chol 177, tg 161, hdl 43, ldl-c 102  From 11/12 showed                                                     hba1c 5.9, ldl-p 1218, chol 133, tg 116, hdl 45, ldl-c 65  From 5/13 showed   gluc 105, cr 0.55, gfr 102, alt 8,  hba1c 6.2,                   chol 148, tg 107, hdl 45, ldl-c 82,  wbc 5.0, hb 12.5, plt 172, vit d 40.3  From 11/13 showed         hba1c 6.2, ldl-p 1888, chol 176, tg 155, hdl 49, ldl-c 86,  wbc 6.2, hb 12.3, plt 182, vit d 34.4  From 5/14 showed         hba1c 6.2,      chol 152, tg 157, hdl 39, ldl-c 82  From 1/15 showed   gluc 113, cr 0.68, gfr>60, alt 11, hba1c 6.2,      chol 146, tg 130, hdl 43, ldl-c 77  From 7/15 showed         hba1c 6.3,      chol 133, tg 124, hdl 39, ldl-c 69,  wbc 6.3, hb 12.2, plt 193  From 6/16 showed   gluc 106, cr 0.74, gfr>60, alt 26, hba1c  6.3,      chol 126, tg 125, hdl 46, ldl-c 55,  wbc 5.7, hb 13.2, plt 203, vit d 7.2,   tsh 1.69  From 12/16 showed gluc 110, cr 0.68, gfr>60, alt 30, hba1c 6.1,      chol 135, tg 147, hdl 47, ldl-c 59,  wbc 6.0, hb 12.7, plt 197  From 6/17 showed   gluc 122, cr 0.71, gfr>60, alt 33, hba1c 6.2,      chol 153, tg 140, hdl 46, ldl-c 79,  wbc 6.3, hb 13.1, plt 190,           tsh 1.92, hep c-  From 1/18 showed         hba1c 6.2,     chol 154, tg 181, hdl 41, ldl-c 77  From 7/18 showed   gluc 129, cr 0.63, gfr>60, alt 26, hba1c 6.1,                 wbc 6.0, hb 12.4, plt 193, vit d 79  From 1/19 showed   gluc 132, cr 0.83, gfr>60,     hba1c 6.1  From 4/19 showed         hba1c 7.0, umar 12 ,   chol 124, tg 109, hdl 46, ldl-c 56,                    vit d 56.1  From 7/19 showed   gluc 106, cr 0.67, gfr>60, alt 25, hba1c 6.0,                 wbc 5.8, hb 13.0, plt 183  From 1/20 showed        hba1c 6.0, umar 17,    chol 130, tg 113, hdl 46, ldl-c  61  From 8/20 showed   gluc 103, cr 0.73, gfr>60, alt 27, hba1c 5.7,       chol 124, tg 150, hdl 44, ldl-c 50,  wbc 5.0, hb13.0, plt 190  Form 2/21 showed         hba1c 5.9, umar na,             vit d 88.9  From 8/21 showed   gluc 109, cr 0.68, gfr>60, alt 35, hba1c 5.5,       chol 156, tg 142, hdl 51, ldl-c 77, wbc 4.2, hb 9.9,   plt 152, vit d 34.8  From 3/22 showed   gluc 100, cr 0.68, gfr>60, alt 22, hba1c 5.7,       chol 129, tg 137, hdl 48, ldl-c 54    Results for orders placed or performed during the hospital encounter of 53/61/44   METABOLIC PANEL, COMPREHENSIVE   Result Value Ref Range    Sodium 140 136 - 145 mmol/L    Potassium 4.1 3.5 - 5.5 mmol/L    Chloride 108 100 - 111 mmol/L    CO2 28 21 - 32 mmol/L    Anion gap 4 3.0 - 18 mmol/L    Glucose 110 (H) 74 - 99 mg/dL    BUN 9 7.0 - 18 MG/DL    Creatinine 0.72 0.6 - 1.3 MG/DL    BUN/Creatinine ratio 13 12 - 20      GFR est AA >60 >60 ml/min/1.3m    GFR est non-AA >60 >60 ml/min/1.72m   Calcium 8.7 8.5 - 10.1 MG/DL    Bilirubin, total 0.3 0.2 - 1.0 MG/DL    ALT (SGPT) 24 13 - 56 U/L  AST (SGOT) 19 10 - 38 U/L    Alk. phosphatase 73 45 - 117 U/L    Protein, total 6.9 6.4 - 8.2 g/dL    Albumin 3.5 3.4 - 5.0 g/dL    Globulin 3.4 2.0 - 4.0 g/dL    A-G Ratio 1.0 0.8 - 1.7     LIPID PANEL   Result Value Ref Range    LIPID PROFILE          Cholesterol, total 128 <200 MG/DL    Triglyceride 129 <150 MG/DL    HDL Cholesterol 44 40 - 60 MG/DL    LDL, calculated 58.2 0 - 100 MG/DL    VLDL, calculated 25.8 MG/DL    CHOL/HDL Ratio 2.9 0 - 5.0     CBC W/O DIFF   Result Value Ref Range    WBC 5.6 4.6 - 13.2 K/uL    RBC 4.07 (L) 4.20 - 5.30 M/uL    HGB 12.0 12.0 - 16.0 g/dL    HCT 39.6 35.0 - 45.0 %    MCV 97.3 78.0 - 100.0 FL    MCH 29.5 24.0 - 34.0 PG    MCHC 30.3 (L) 31.0 - 37.0 g/dL    RDW 13.7 11.6 - 14.5 %    PLATELET 184 135 - 420 K/uL    MPV 9.6 9.2 - 11.8 FL    NRBC 0.0 0 PER 100 WBC    ABSOLUTE NRBC 0.00 0.00 - 0.01 K/uL   HEMOGLOBIN A1C W/O EAG   Result  Value Ref Range    Hemoglobin A1c 5.8 (H) 4.2 - 5.6 %     We reviewed the patient's labs from the last several visits to point out trends in the numbers        Patient Active Problem List   Diagnosis Code    Vitamin D deficiency E55.9    Anxiety F41.9    Dyslipidemia E78.5    Arthritis, degenerative M19.90    Overweight with body mass index (BMI) of 25 to 25.9 in adult E66.3, Z68.25    Controlled type 2 diabetes mellitus, without long-term current use of insulin (HCC) E11.9    Breast cancer of lower-outer quadrant of left female breast (HCC) C50.512    Neuropathy due to chemotherapeutic drug (HCC) G62.0, T45.1X5A     Assessment and plan:  1.  DM.  Continue januvia and controlled.  F/u ophth  2.  Hyperlipidemia. Continue lipitor and at target  3.  Hypovitaminosis D. Check levels next draw  4.  Anxiety.  Continue celexa  5.  Overweight.  Lifestyle and dietary measures.  Portion control reiterated.  6.  Endometrial ca.  Per Dr Sheral Flow  7.  Breast ca.  Per Dr Meryl Crutch and Dr Mariea Clonts  8.  Possible neuropathy from chemo.  Gabapentin   9.  Venous disease.  She will elevated, walk, consider stockings, declined diuretic  10.  Flu given, covid booster at pharmacy      RTC 3/23    Above conditions discussed at length and patient vocalized understanding.  All questions answered to patient satisfaction        ICD-10-CM ICD-9-CM    1. Controlled type 2 diabetes mellitus without complication, without long-term current use of insulin (HCC)  E11.9 250.00 MICROALBUMIN, UR, RAND W/ MICROALB/CREAT RATIO      METABOLIC PANEL, BASIC      HEMOGLOBIN A1C WITH EAG      2. Neuropathy due to chemotherapeutic drug ([place])  G62.0 357.6  T45.1X5A E933.1       3. Vitamin D deficiency  E55.9 268.9 VITAMIN D, 25 HYDROXY      4. Malignant neoplasm of lower-outer quadrant of left breast of female, estrogen receptor negative (HCC)  C50.512 174.5     Z17.1 V86.1

## 2021-07-14 NOTE — Progress Notes (Signed)
 Laura Roman presents with   Chief Complaint   Patient presents with    Follow-up     6 months    Cholesterol Problem    Diabetes    Labs     07-07-21            1. Have you been to the ER, urgent care clinic since your last visit?  Hospitalized since your last visit?  [facility] 02-18-21 for breast surgery    2. Have you seen or consulted any other health care providers outside of the [facility] since your last visit?  YES, [facility] for breast imaging and diagnosis      3. For patients aged 82-75: Has the patient had a colonoscopy / FIT/ Cologuard? Yes - no Care Gap present      If the patient is female:    4. For patients aged 55-74: Has the patient had a mammogram within the past 2 years? Yes - no Care Gap present      5. For patients aged 21-65: Has the patient had a pap smear? NA - based on age or sex

## 2021-07-17 DIAGNOSIS — R6889 Other general symptoms and signs: Secondary | ICD-10-CM | POA: Diagnosis not present

## 2021-07-17 DIAGNOSIS — M25512 Pain in left shoulder: Secondary | ICD-10-CM | POA: Diagnosis not present

## 2021-07-18 DIAGNOSIS — M25512 Pain in left shoulder: Secondary | ICD-10-CM | POA: Diagnosis not present

## 2021-07-22 ENCOUNTER — Ambulatory Visit (INDEPENDENT_AMBULATORY_CARE_PROVIDER_SITE_OTHER): Payer: Medicare Other | Admitting: Family Medicine

## 2021-07-22 ENCOUNTER — Other Ambulatory Visit: Payer: Self-pay

## 2021-07-22 ENCOUNTER — Telehealth: Payer: Self-pay | Admitting: Family Medicine

## 2021-07-22 ENCOUNTER — Encounter: Payer: Self-pay | Admitting: Family Medicine

## 2021-07-22 VITALS — BP 130/57 | HR 51 | Ht 66.0 in | Wt 170.0 lb

## 2021-07-22 DIAGNOSIS — R2689 Other abnormalities of gait and mobility: Secondary | ICD-10-CM

## 2021-07-22 DIAGNOSIS — Z23 Encounter for immunization: Secondary | ICD-10-CM

## 2021-07-22 DIAGNOSIS — E782 Mixed hyperlipidemia: Secondary | ICD-10-CM

## 2021-07-22 DIAGNOSIS — D510 Vitamin B12 deficiency anemia due to intrinsic factor deficiency: Secondary | ICD-10-CM | POA: Diagnosis not present

## 2021-07-22 DIAGNOSIS — R42 Dizziness and giddiness: Secondary | ICD-10-CM

## 2021-07-22 DIAGNOSIS — M818 Other osteoporosis without current pathological fracture: Secondary | ICD-10-CM | POA: Diagnosis not present

## 2021-07-22 DIAGNOSIS — E611 Iron deficiency: Secondary | ICD-10-CM

## 2021-07-22 DIAGNOSIS — R7301 Impaired fasting glucose: Secondary | ICD-10-CM

## 2021-07-22 LAB — POCT GLYCOSYLATED HEMOGLOBIN (HGB A1C): Hemoglobin A1C: 5.8 % — AB (ref 4.0–5.6)

## 2021-07-22 MED ORDER — CYANOCOBALAMIN 1000 MCG/ML IJ SOLN
1000.0000 ug | Freq: Once | INTRAMUSCULAR | Status: AC
  Administered 2021-07-22: 1000 ug via INTRAMUSCULAR

## 2021-07-22 NOTE — Assessment & Plan Note (Signed)
Due to recheck lipids. 

## 2021-07-22 NOTE — Assessment & Plan Note (Addendum)
She was concerned that her bone density really has not improved even though she is on Prolia.  After further discussion I realized she was not taking calcium with vitamin D so we did discuss starting that.  She has been dealing with multiple stress fractures and even a Jones fracture in one of her feet.  Which she will likely have surgically pinned later this year.  DEXA 04/07/17: T-score -2.9.  Was on fosamax, now on Prolia DEXA Aug 2020, T score -2.6. DEXA Aug 2022: T score -2.6

## 2021-07-22 NOTE — Patient Instructions (Signed)
For osteoporosis I recommend daily calcium with vitamin D.  There are several good over-the-counter products such as Caltrate D or Viactiv chews.  Please take 1 twice a day.  Should have enough calcium and vitamin D.

## 2021-07-22 NOTE — Progress Notes (Signed)
Established Patient Office Visit  Subjective:  Patient ID: Mia [name], female    DOB: [birth year]  Age: 70 y.o. MRN: [id number]  CC:  Chief Complaint  Patient presents with   ifg   b12 deficiency    HPI Mia [name] presents for   Impaired fasting glucose-no increased thirst or urination. No symptoms consistent with hypoglycemia.  She has been off of her metformin and doing well.  F/U  pernicious anemia-due for B12 injection today.  She also had left shoulder surgery recently and is starting physical therapy she still only able to lift about 1 pound weight so she is not able to do a lot with the shoulder but she says she still has pretty good range of motion which is great.  Is a concerned because her balance has been off.  She says its been particularly worse the last 2 months in fact she is fallen 2-3 times since then and no significant injuries.  She denies any dizziness or lightheadedness.  She says she just feels strange when it happens but it is becoming more more frequent.    Past Medical History:  Diagnosis Date   Anal fissure    Anemia    Arthritis    Blood transfusion    C. difficile colitis    Chest pain    a. 01/2013 MV: EF 59%, no ischemia.   Chronic Dyspnea    a. 01/2013 Echo: EF 60-65%, Gr 1 DD, PASP 61mHg.// Echo 01/2020: EF 60-65, no RWMA, GR 1 DD, GLS -21.6%, normal RV SF, trivial MR    COVID-19    DDD (degenerative disc disease), cervical    DDD (degenerative disc disease), lumbar    Diabetes mellitus    Fibromyalgia    Gall stones    GERD (gastroesophageal reflux disease)    gastritis   Hemorrhoids    Hypertension    Interstitial cystitis    MCI (mild cognitive impairment) 11/25/2017   Memory difficulty 12/14/2013   Neuromuscular disorder (HCC)    sclerosis   Osteoporosis    Peptic ulcer    PONV (postoperative nausea and vomiting)    Pseudogout    Raynaud phenomenon    Rectal bleeding    Sleep apnea    a. on cpap.   [place]    Syncope 09/10/2015   Thyroid disease    hypothyroidism   Tremor, essential 05/14/2016    Past Surgical History:  Procedure Laterality Date   APPENDECTOMY     BLADDER SURGERY     x2   BLADDER SURGERY     BREAST BIOPSY     CARDIAC CATHETERIZATION     CARPAL TUNNEL RELEASE     CATARACT EXTRACTION Bilateral    CHOLECYSTECTOMY     DILATION AND CURETTAGE OF UTERUS     ENTEROCELE REPAIR     x2   EYE SURGERY     retina   hysterectomy - unknown type     JOINT REPLACEMENT     KNEE ARTHROPLASTY     KNEE SURGERY     x6   NECK SURGERY     fusion   OOPHORECTOMY     QUADRICEPS REPAIR Right    RECTOCELE REPAIR     x2   SHOULDER ARTHROSCOPY WITH SUBACROMIAL DECOMPRESSION, ROTATOR CUFF REPAIR AND BICEP TENDON REPAIR  10/06/2012   Procedure: SHOULDER ARTHROSCOPY WITH SUBACROMIAL DECOMPRESSION, ROTATOR CUFF REPAIR AND BICEP TENDON REPAIR;  Surgeon: JNita Sells MD;  Location:  [facility];  Service: Orthopedics;  Laterality: Right;  Arthroscopic  Repair  of  Subscapularis, Open Biceps Tenodesis   SHOULDER SURGERY     bilateral- bones spur   SHOULDER SURGERY Left 04/15/2021   TONSILLECTOMY     TOTAL KNEE ARTHROPLASTY     TOTAL SHOULDER ARTHROPLASTY      Family History  Problem Relation Age of Onset   Heart attack Mother    Stroke Mother    Diabetes Mother    Heart disease Mother    Colon polyps Mother    Asthma Mother    Breast cancer Maternal Grandmother    Wilson's disease Maternal Grandmother    Pancreatic cancer Maternal Grandfather    Heart disease Father    Heart attack Father    Asthma Father    Stroke Sister    Hypertension Sister    Rheum arthritis Sister    Dementia Sister    Asthma Sister    Lupus Sister    Asthma Sister     Social History   Socioeconomic History   Marital status: Married    Spouse name: Jenny Reichmann "[name]"   Number of children: 2   Years of education: 14   Highest education level: Associate  degree: academic program  Occupational History   Occupation: Retired  Tobacco Use   Smoking status: Never   Smokeless tobacco: Never  Vaping Use   Vaping Use: Never used  Substance and Sexual Activity   Alcohol use: No    Alcohol/week: 0.0 standard drinks   Drug use: No   Sexual activity: Not on file  Other Topics Concern   Not on file  Social History Narrative   Lives with her husband. She likes to hang out with her friends and play games and do bible study.   Social Determinants of Health   Financial Resource Strain: Low Risk    Difficulty of Paying Living Expenses: Not hard at all  Food Insecurity: No Food Insecurity   Worried About Charity fundraiser in the Last Year: Never true   [place] in the Last Year: Never true  Transportation Needs: No Transportation Needs   Lack of Transportation (Medical): No   Lack of Transportation (Non-Medical): No  Physical Activity: Inactive   Days of Exercise per Week: 0 days   Minutes of Exercise per Session: 0 min  Stress: No Stress Concern Present   Feeling of Stress : Not at all  Social Connections: Socially Integrated   Frequency of Communication with Friends and Family: More than three times a week   Frequency of Social Gatherings with Friends and Family: More than three times a week   Attends Religious Services: More than 4 times per year   Active Member of [employer] or Organizations: Yes   Attends Music therapist: More than 4 times per year   Marital Status: Married  Human resources officer Violence: Not At Risk   Fear of Current or Ex-Partner: No   Emotionally Abused: No   Physically Abused: No   Sexually Abused: No    Outpatient Medications Prior to Visit  Medication Sig Dispense Refill   amLODipine (NORVASC) 5 MG tablet Take 1 tablet (5 mg total) by mouth daily. 90 tablet 3   colestipol (COLESTID) 1 g tablet Take 1 g by mouth 2 (two) times daily.     Cyanocobalamin (VITAMIN B-12 IJ) Inject 1 mL as directed  every 30 (thirty) days.      denosumab (  PROLIA) 60 MG/ML SOLN injection Inject 60 mg into the skin every 6 (six) months. Administer in upper arm, thigh, or abdomen     donepezil (ARICEPT) 10 MG tablet TAKE 1 TABLET AT BEDTIME 90 tablet 3   gabapentin (NEURONTIN) 300 MG capsule TAKE 1 TO 2 CAPSULES BY MOUTH UP TO TWO TIMES DAILY AS NEEDED 180 capsule 3   ibuprofen (ADVIL) 600 MG tablet ibuprofen 600 mg tablet  TAKE 1 TABLET BY MOUTH EVERY 6 HOURS AS NEEDED     ipratropium (ATROVENT) 0.03 % nasal spray Place 2 sprays into both nostrils every 12 (twelve) hours. 30 mL 1   leflunomide (ARAVA) 20 MG tablet TAKE ONE TABLET BY MOUTH DAILY 90 tablet 0   losartan (COZAAR) 50 MG tablet Take 2 tablets (100 mg total) by mouth daily. Take 1 tablet in the morning and 1 tablet in evening as needed for blood pressure greater than 150 180 tablet 3   Melatonin 10 MG CAPS Take 10 capsules by mouth daily.     memantine (NAMENDA) 10 MG tablet Take 1 tablet (10 mg total) by mouth 2 (two) times daily. 180 tablet 4   metoprolol tartrate (LOPRESSOR) 100 MG tablet TAKE 1 AND 1/2 TABLETS TWICE DAILY 45 tablet 0   ondansetron (ZOFRAN-ODT) 8 MG disintegrating tablet Take 1 tablet by mouth as needed for nausea.     pantoprazole (PROTONIX) 40 MG tablet Take 40 mg by mouth 2 (two) times daily.     polyethylene glycol powder (GLYCOLAX/MIRALAX) 17 GM/SCOOP powder Take 17 g by mouth daily as needed for mild constipation or moderate constipation.      primidone (MYSOLINE) 50 MG tablet TAKE 1 TABLET AT BEDTIME 90 tablet 1   venlafaxine XR (EFFEXOR-XR) 75 MG 24 hr capsule TAKE 1 CAPSULE EVERY DAY WITH BREAKFAST 90 capsule 3   No facility-administered medications prior to visit.    Allergies  Allergen Reactions   Codeine Nausea And Vomiting   Myrbetriq  [Mirabegron] Other (See Comments)    SEVERE HEADACHE   Percocet [Oxycodone-Acetaminophen] Nausea And Vomiting   Propoxyphene Nausea And Vomiting   Nystatin    Celebrex  [Celecoxib] Other (See Comments)    Other reaction(s): Other flushed   Darvocet [Propoxyphene N-Acetaminophen] Nausea And Vomiting   Erythromycin Nausea And Vomiting    Nausea and vomiting    Hydrocodone Nausea And Vomiting   Lyrica [Pregabalin] Swelling    Legs swelling    Macrodantin [Nitrofurantoin] Nausea And Vomiting   Toradol [Ketorolac Tromethamine] Nausea And Vomiting    Per patient, only PO form causes nausea and vomiting. Can take injection without issue   Tramadol Nausea And Vomiting   Verapamil Nausea And Vomiting    REACTION: intolerance    ROS Review of Systems    Objective:    Physical Exam Constitutional:      Appearance: Normal appearance. She is well-developed.  HENT:     Head: Normocephalic and atraumatic.  Cardiovascular:     Rate and Rhythm: Normal rate and regular rhythm.     Heart sounds: Normal heart sounds.  Pulmonary:     Effort: Pulmonary effort is normal.     Breath sounds: Normal breath sounds.  Skin:    General: Skin is warm and dry.  Neurological:     Mental Status: She is alert and oriented to person, place, and time.  Psychiatric:        Behavior: Behavior normal.    BP (!) 130/57   Pulse (!) 51  Ht 5' 6" (1.676 m)   Wt 170 lb (77.1 kg)   SpO2 100%   BMI 27.44 kg/m  Wt Readings from Last 3 Encounters:  07/22/21 170 lb (77.1 kg)  07/02/21 172 lb (78 kg)  04/08/21 165 lb (74.8 kg)     Health Maintenance Due  Topic Date Due   Zoster Vaccines- Shingrix (1 of 2) Never done   OPHTHALMOLOGY EXAM  04/10/2020   COVID-19 Vaccine (4 - Booster for Moderna series) 12/27/2020    There are no preventive care reminders to display for this patient.  Lab Results  Component Value Date   TSH 1.34 05/21/2020   Lab Results  Component Value Date   WBC 5.1 06/10/2021   HGB 12.1 06/10/2021   HCT 39.4 06/10/2021   MCV 90.6 06/10/2021   PLT 334 06/10/2021   Lab Results  Component Value Date   NA 135 06/10/2021   K 4.5 06/10/2021    CO2 27 06/10/2021   GLUCOSE 119 (H) 06/10/2021   BUN 8 06/10/2021   CREATININE 0.71 06/10/2021   BILITOT 0.4 06/10/2021   ALKPHOS 73 05/21/2020   AST 34 06/10/2021   ALT 31 (H) 06/10/2021   PROT 6.4 06/10/2021   ALBUMIN 4.0 05/21/2020   CALCIUM 9.4 06/10/2021   ANIONGAP 12 02/22/2020   EGFR 92 06/10/2021   GFR 74.39 05/21/2020   Lab Results  Component Value Date   CHOL 128 06/07/2019   Lab Results  Component Value Date   HDL 52 06/07/2019   Lab Results  Component Value Date   LDLCALC 51 06/07/2019   Lab Results  Component Value Date   TRIG 97 11/07/2019   Lab Results  Component Value Date   CHOLHDL 2.5 06/07/2019   Lab Results  Component Value Date   HGBA1C 5.8 (A) 07/22/2021      Assessment & Plan:   Problem List Items Addressed This Visit       Endocrine   IFG (impaired fasting glucose) - Primary    A1c is stable continue to monitor.  Repeat in 6 months.      Relevant Orders   POCT glycosylated hemoglobin (Hb A1C) (Completed)   Lipid Panel w/reflex Direct LDL   COMPLETE METABOLIC PANEL WITH GFR   CBC     Musculoskeletal and Integument   Osteoporosis    She was concerned that her bone density really has not improved even though she is on Prolia.  After further discussion I realized she was not taking calcium with vitamin D so we did discuss starting that.  She has been dealing with multiple stress fractures and even a Jones fracture in one of her feet.  Which she will likely have surgically pinned later this year.  DEXA 04/07/17: T-score -2.9.  Was on fosamax, now on Prolia DEXA Aug 2020, T score -2.6. DEXA Aug 2022: T score -2.6        Other   Pernicious anemia    B12 injection given today.  Lab Results  Component Value Date   [id number] 283 05/01/2021         Mixed hyperlipidemia    Due to recheck lipids.      Relevant Orders   Lipid Panel w/reflex Direct LDL   Other Visit Diagnoses     Need for immunization against influenza        Relevant Orders   Flu Vaccine QUAD High Dose(Fluad) (Completed)   Iron deficiency       Relevant Orders  CBC   Balance problem           Balance disorder-I do feel like she is having some issues with balance which could be vestibular.  I like to refer her to vestibular rehab specialist for further work-up evaluation and treatment.  She is currently getting rehab on her shoulder here in [place] through her orthopedist.  So we will see if they have someone there who has her extra certification and vestibular rehab.  Meds ordered this encounter  Medications   cyanocobalamin ((VITAMIN B-12)) injection 1,000 mcg    Follow-up: Return in about 6 months (around 01/19/2022).     [name], MD

## 2021-07-22 NOTE — Telephone Encounter (Signed)
Please call the rehab in [place] that I believe is owned by [facility] patient is getting shoulder rehab they are currently.  I want a know if somebody there at that location is doing vestibular rehab as she has been also having some balance issues please let me know if so we can place referral and if not we may be able to refer her elsewhere.

## 2021-07-22 NOTE — Assessment & Plan Note (Signed)
B12 injection given today.  Lab Results  Component Value Date   VITAMINB12 479 05/01/2021

## 2021-07-22 NOTE — Assessment & Plan Note (Signed)
A1c is stable continue to monitor.  Repeat in 6 months.

## 2021-07-24 DIAGNOSIS — M5416 Radiculopathy, lumbar region: Secondary | ICD-10-CM | POA: Diagnosis not present

## 2021-07-24 DIAGNOSIS — M25512 Pain in left shoulder: Secondary | ICD-10-CM | POA: Diagnosis not present

## 2021-07-24 DIAGNOSIS — R6889 Other general symptoms and signs: Secondary | ICD-10-CM | POA: Diagnosis not present

## 2021-07-24 DIAGNOSIS — M5442 Lumbago with sciatica, left side: Secondary | ICD-10-CM | POA: Diagnosis not present

## 2021-07-25 DIAGNOSIS — I10 Essential (primary) hypertension: Secondary | ICD-10-CM

## 2021-07-25 DIAGNOSIS — M818 Other osteoporosis without current pathological fracture: Secondary | ICD-10-CM | POA: Diagnosis not present

## 2021-07-29 ENCOUNTER — Other Ambulatory Visit: Payer: Self-pay | Admitting: Physician Assistant

## 2021-07-29 NOTE — Telephone Encounter (Signed)
Next Visit: 12/04/2021  Last Visit: 07/02/2021  Last Fill: 05/06/2021  DX: Seronegative rheumatoid arthritis   Current Dose per office note 07/02/2021:  Arava 20 mg 1 tablet by mouth daily  Labs: 06/10/2021 CBC is normal.  Glucose is mildly elevated probably not a fasting sample.  Liver functions are mildly elevated  Okay to refill Arava?

## 2021-07-29 NOTE — Telephone Encounter (Signed)
Lady Gary call the patient and see if maybe she has a direct contact number or business card.

## 2021-07-29 NOTE — Telephone Encounter (Signed)
I called and left a message for patient to return my call so I could see if she has a number for [facility]

## 2021-07-30 NOTE — Telephone Encounter (Signed)
Dr. [name] called me back and stated they do not do the vestibular rehab there and she would like to come here to have that done. She also wanted to know if you thought maybe her potassium or sodium could be low she states she isn't dizzy just out of it and her balance is off and the other night she went home and and felt so bad she laid down at 5 and went to sleep.   Mia [name]

## 2021-07-30 NOTE — Telephone Encounter (Signed)
Pt informed of PT referral and need for labs.  Pt expressed understanding and is agreeable.  Charyl Bigger, CMA

## 2021-07-30 NOTE — Telephone Encounter (Signed)
Please place order for PT for here, as well as a BMP.

## 2021-07-31 DIAGNOSIS — Z20822 Contact with and (suspected) exposure to covid-19: Secondary | ICD-10-CM | POA: Diagnosis not present

## 2021-08-01 DIAGNOSIS — E782 Mixed hyperlipidemia: Secondary | ICD-10-CM | POA: Diagnosis not present

## 2021-08-01 DIAGNOSIS — E611 Iron deficiency: Secondary | ICD-10-CM | POA: Diagnosis not present

## 2021-08-01 DIAGNOSIS — R7301 Impaired fasting glucose: Secondary | ICD-10-CM | POA: Diagnosis not present

## 2021-08-01 LAB — CBC
HCT: 43.5 % (ref 35.0–45.0)
Hemoglobin: 14.2 g/dL (ref 11.7–15.5)
MCH: 29 pg (ref 27.0–33.0)
MCHC: 32.6 g/dL (ref 32.0–36.0)
MCV: 88.8 fL (ref 80.0–100.0)
MPV: 10.1 fL (ref 7.5–12.5)
Platelets: 416 10*3/uL — ABNORMAL HIGH (ref 140–400)
RBC: 4.9 10*6/uL (ref 3.80–5.10)
RDW: 14.2 % (ref 11.0–15.0)
WBC: 5.4 10*3/uL (ref 3.8–10.8)

## 2021-08-01 LAB — COMPLETE METABOLIC PANEL WITH GFR
AG Ratio: 1.7 (calc) (ref 1.0–2.5)
ALT: 25 U/L (ref 6–29)
AST: 24 U/L (ref 10–35)
Albumin: 4.3 g/dL (ref 3.6–5.1)
Alkaline phosphatase (APISO): 86 U/L (ref 37–153)
BUN: 7 mg/dL (ref 7–25)
CO2: 27 mmol/L (ref 20–32)
Calcium: 10.2 mg/dL (ref 8.6–10.4)
Chloride: 93 mmol/L — ABNORMAL LOW (ref 98–110)
Creat: 0.81 mg/dL (ref 0.50–1.05)
Globulin: 2.6 g/dL (calc) (ref 1.9–3.7)
Glucose, Bld: 90 mg/dL (ref 65–99)
Potassium: 5 mmol/L (ref 3.5–5.3)
Sodium: 131 mmol/L — ABNORMAL LOW (ref 135–146)
Total Bilirubin: 0.4 mg/dL (ref 0.2–1.2)
Total Protein: 6.9 g/dL (ref 6.1–8.1)
eGFR: 79 mL/min/{1.73_m2} (ref >=60)

## 2021-08-01 LAB — LIPID PANEL W/REFLEX DIRECT LDL
Cholesterol: 215 mg/dL — ABNORMAL HIGH (ref <200)
HDL: 68 mg/dL (ref >=50)
LDL Cholesterol (Calc): 123 mg/dL (calc) — ABNORMAL HIGH
Non-HDL Cholesterol (Calc): 147 mg/dL (calc) — ABNORMAL HIGH (ref <130)
Total CHOL/HDL Ratio: 3.2 (calc) (ref <5.0)
Triglycerides: 125 mg/dL (ref <150)

## 2021-08-02 ENCOUNTER — Other Ambulatory Visit (HOSPITAL_COMMUNITY): Payer: Self-pay | Admitting: Orthopedic Surgery

## 2021-08-04 ENCOUNTER — Telehealth: Payer: Self-pay | Admitting: Adult Health

## 2021-08-04 DIAGNOSIS — M5416 Radiculopathy, lumbar region: Secondary | ICD-10-CM | POA: Diagnosis not present

## 2021-08-04 DIAGNOSIS — M5116 Intervertebral disc disorders with radiculopathy, lumbar region: Secondary | ICD-10-CM | POA: Diagnosis not present

## 2021-08-04 DIAGNOSIS — M4727 Other spondylosis with radiculopathy, lumbosacral region: Secondary | ICD-10-CM | POA: Diagnosis not present

## 2021-08-04 DIAGNOSIS — M4316 Spondylolisthesis, lumbar region: Secondary | ICD-10-CM | POA: Diagnosis not present

## 2021-08-04 DIAGNOSIS — M4726 Other spondylosis with radiculopathy, lumbar region: Secondary | ICD-10-CM | POA: Diagnosis not present

## 2021-08-04 MED ORDER — VENLAFAXINE HCL ER 75 MG PO CP24: ORAL_CAPSULE | ORAL | 0 refills | Status: DC

## 2021-08-04 MED ORDER — DONEPEZIL HCL 10 MG PO TABS: 10.0000 mg | ORAL_TABLET | Freq: Every day | ORAL | 0 refills | Status: DC

## 2021-08-04 NOTE — Telephone Encounter (Signed)
Pt called requesting refill for donepezil (ARICEPT) 10 MG tablet and venlafaxine XR (EFFEXOR-XR) 75 MG 24 hr capsule, a 90 day supply for both medications. Pharmacy [facility] [phone].

## 2021-08-04 NOTE — Progress Notes (Signed)
Kenndra, total cholesterol and LDL are mildly elevated just continue work on [employer] and regular exercise.  Sodium is a little low this time at 131 it has been low before but it was normal a month ago so doing to keep an eye on this and plan to recheck again in a month.  Okay to liberalize sodium in the diet.  But do not go to crazy with it.  Liver enzymes are normal.  Blood count is normal.  No anemia.

## 2021-08-04 NOTE — Telephone Encounter (Signed)
Refill sent.

## 2021-08-06 ENCOUNTER — Ambulatory Visit (INDEPENDENT_AMBULATORY_CARE_PROVIDER_SITE_OTHER): Payer: Medicare Other | Admitting: Physical Therapy

## 2021-08-06 ENCOUNTER — Encounter: Payer: Self-pay | Admitting: Physical Therapy

## 2021-08-06 ENCOUNTER — Other Ambulatory Visit: Payer: Self-pay

## 2021-08-06 DIAGNOSIS — R42 Dizziness and giddiness: Secondary | ICD-10-CM

## 2021-08-06 DIAGNOSIS — R2681 Unsteadiness on feet: Secondary | ICD-10-CM

## 2021-08-06 NOTE — Patient Instructions (Signed)
Access Code: [email or web address] URL: [email or web address] Date: 08/06/2021 Prepared by: Isabelle Course  Exercises Seated Gaze Stabilization with Head Rotation - 3-5 x daily - 7 x weekly - 3 sets - 10 reps Seated Horizontal Saccades - 3-5 x daily - 7 x weekly - 3 sets - 10 reps

## 2021-08-06 NOTE — Therapy (Signed)
[place], [place], [address] Phone: [phone]   Fax:  [phone]  Physical Therapy Evaluation  Patient Details  Name: HODAN WURTZ MRN: [id number] Date of Birth: 27-Oct-1950 Referring Provider (PT): Metheney   Encounter Date: 08/06/2021   PT End of Session - 08/06/21 1244     Visit Number 1    Number of Visits 6    Date for PT Re-Evaluation 09/17/21    PT Start Time 9622    PT Stop Time 2979    PT Time Calculation (min) 50 min    Activity Tolerance Patient tolerated treatment well    Behavior During Therapy [facility] for tasks assessed/performed             Past Medical History:  Diagnosis Date   Anal fissure    Anemia    Arthritis    Blood transfusion    C. difficile colitis    Chest pain    a. 01/2013 MV: EF 59%, no ischemia.   Chronic Dyspnea    a. 01/2013 Echo: EF 60-65%, Gr 1 DD, PASP 64mmHg.// Echo 01/2020: EF 60-65, no RWMA, GR 1 DD, GLS -21.6%, normal RV SF, trivial MR    COVID-19    DDD (degenerative disc disease), cervical    DDD (degenerative disc disease), lumbar    Diabetes mellitus    Fibromyalgia    Gall stones    GERD (gastroesophageal reflux disease)    gastritis   Hemorrhoids    Hypertension    Interstitial cystitis    MCI (mild cognitive impairment) 11/25/2017   Memory difficulty 12/14/2013   Neuromuscular disorder (HCC)    sclerosis   Osteoporosis    Peptic ulcer    PONV (postoperative nausea and vomiting)    Pseudogout    Raynaud phenomenon    Rectal bleeding    Sleep apnea    a. on cpap.   SVT (supraventricular tachycardia) ([place])    Syncope 09/10/2015   Thyroid disease    hypothyroidism   Tremor, essential 05/14/2016    Past Surgical History:  Procedure Laterality Date   APPENDECTOMY     BLADDER SURGERY     x2   BLADDER SURGERY     BREAST BIOPSY     CARDIAC CATHETERIZATION     CARPAL TUNNEL RELEASE     CATARACT EXTRACTION Bilateral     CHOLECYSTECTOMY     DILATION AND CURETTAGE OF UTERUS     ENTEROCELE REPAIR     x2   EYE SURGERY     retina   hysterectomy - unknown type     JOINT REPLACEMENT     KNEE ARTHROPLASTY     KNEE SURGERY     x6   NECK SURGERY     fusion   OOPHORECTOMY     QUADRICEPS REPAIR Right    RECTOCELE REPAIR     x2   SHOULDER ARTHROSCOPY WITH SUBACROMIAL DECOMPRESSION, ROTATOR CUFF REPAIR AND BICEP TENDON REPAIR  10/06/2012   Procedure: SHOULDER ARTHROSCOPY WITH SUBACROMIAL DECOMPRESSION, ROTATOR CUFF REPAIR AND BICEP TENDON REPAIR;  Surgeon: Nita Sells, MD;  Location: [place];  Service: Orthopedics;  Laterality: Right;  Arthroscopic  Repair  of  Subscapularis, Open Biceps Tenodesis   SHOULDER SURGERY     bilateral- bones spur   SHOULDER SURGERY Left 04/15/2021   TONSILLECTOMY     TOTAL KNEE ARTHROPLASTY     TOTAL SHOULDER ARTHROPLASTY      There  were no vitals filed for this visit.    Subjective Assessment - 08/06/21 1155     Subjective For the past "month or 2" pt has been having balance problems, increased "dizziness" and increased headaches. Pt has had multiple falls in the past few months but no major injury. Pt had Lt shoulder surgery 05/15/21 and [facility] but they do not treat vestibular issues. Pt states she has had vertigo before and "this feels different". Pt states symptoms are unpredictable and are not associated with certain movements or positions.    Pertinent History Lt shoulder surgery 7/22, history of vertigo    Patient Stated Goals improve balance to prevent falls    Currently in Pain? No/denies                [facility] PT Assessment - 08/06/21 0001       Assessment   Medical Diagnosis dizziness, balance problem    Referring Provider (PT) Metheney    Onset Date/Surgical Date 06/06/21    Next MD Visit 08/2021    Prior Therapy for Lt shoulder      Precautions   Precautions None      Balance  Screen   Has the patient fallen in the past 6 months Yes    How many times? multiple    Has the patient had a decrease in activity level because of a fear of falling?  Yes    Is the patient reluctant to leave their home because of a fear of falling?  No      Prior Function   Level of Independence Independent      Observation/Other Assessments   Focus on Therapeutic Outcomes (FOTO)  not assessed due to diagnosis                    Vestibular Assessment - 08/06/21 0001       Symptom Behavior   Subjective history of current problem for 1-2 months symptoms of "nausea and "wooziness""    Type of Dizziness  Imbalance    Frequency of Dizziness every day    Duration of Dizziness nausea lasts hours, dizziness lasts minutes    Symptom Nature Spontaneous    Aggravating Factors No known aggravating factors    Relieving Factors Rest    Progression of Symptoms Worse      Oculomotor Exam   Oculomotor Alignment Normal    Ocular ROM normal    Spontaneous Absent    Gaze-induced  Absent    Head shaking Horizontal Comment   unable to tolerate due to symptoms   Head Shaking Vertical Comment   unable to tolerate due to symptoms   Smooth Pursuits Intact   pt begins to get headache with testing   Saccades Slow    Comment begins to get headache with saccades and smooth pursuits      Vestibulo-Ocular Reflex   VOR 1 Head Only (x 1 viewing) able to perform x 3 before symptoms onset      Positional Testing   Sidelying Test Sidelying Right;Sidelying Left    Horizontal Canal Testing Horizontal Canal Left;Horizontal Canal Left Intensity      Sidelying Right   Sidelying Right Duration none    Sidelying Right Symptoms No nystagmus      Sidelying Left   Sidelying Left Duration none    Sidelying Left Symptoms No nystagmus      Horizontal Canal Left   Horizontal Canal Left Duration 1  minute    Horizontal Canal Left Symptoms Nystagmus      Horizontal Canal Left Intensity   Horizontal  Canal Left Intensity Moderate      Positional Sensitivities   Rolling Left Moderate dizziness    Positional Sensitivities Comments up from left sidelying symptoms 5/10                Objective measurements completed on examination: See above findings.        Vestibular Treatment/Exercise - 08/06/21 0001       Vestibular Treatment/Exercise   Vestibular Treatment Provided Canalith Repositioning;Gaze    Canalith Repositioning Canal Roll Left    Gaze Exercises X1 Viewing Horizontal      Canal Roll Left   Number of Reps  1    Overall Response  No change    Response Details  able to tolerate with moderate symptoms      X1 Viewing Horizontal   Foot Position seated    Reps 10    Comments moderate dizziness. then saccades horizontal x 10      X1 Viewing Vertical   Foot Position --    Reps --                    PT Education - 08/06/21 1241     Education Details PT POC and goals, HEP    Person(s) Educated Patient    Methods Explanation;Demonstration;Handout    Comprehension Returned demonstration;Verbalized understanding                 PT Long Term Goals - 08/06/21 1334       PT LONG TERM GOAL #1   Title Pt will be independent with HEP    Time 6    Period Weeks    Status New    Target Date 09/17/21      PT LONG TERM GOAL #2   Title Pt will report a 50% reduction in vestibular symptoms    Time 6    Period Weeks    Status New    Target Date 09/17/21      PT LONG TERM GOAL #3   Title PT will perform BERG balance test and DGI to assess dynamic gait and balance    Time 2    Period Weeks    Status New    Target Date 08/20/21                    Plan - 08/06/21 1244     Clinical Impression Statement Pt is a 70 y/o female who is referred for balance problems and dizziness. Pt presents with vestibular hypofunction with some positional sensitivities to Lt sidelying and Lt rolling. Pt with decreased balance as she reports multiple  falls in the past few months. Pt will benefit from skilled PT to address deficits and improve balance and reduce risk of falls    Personal Factors and Comorbidities Age;Past/Current Experience;Time since onset of injury/illness/exacerbation    Examination-Activity Limitations Locomotion Level;Bed Mobility    Examination-Participation Restrictions Community Activity;Driving    Stability/Clinical Decision Making Evolving/Moderate complexity    Clinical Decision Making Moderate    Rehab Potential Good    PT Frequency 1x / week    PT Duration 6 weeks    PT Treatment/Interventions Canalith Repostioning;Gait training;Functional mobility training;Neuromuscular re-education;Balance training;Therapeutic exercise;Therapeutic activities;Patient/family education;Vestibular    PT Next Visit Plan assess balance, assess HEP, progress habituation as tolerated    PT Home Exercise Plan [phone]  Consulted and Agree with Plan of Care Patient             Patient will benefit from skilled therapeutic intervention in order to improve the following deficits and impairments:  Difficulty walking, Dizziness, Decreased balance  Visit Diagnosis: Unsteadiness on feet - Plan: PT plan of care cert/re-cert  Dizziness and giddiness - Plan: PT plan of care cert/re-cert     Problem List Patient Active Problem List   Diagnosis Date Noted   Pernicious anemia 09/11/2020   Cyst of skin and subcutaneous tissue 09/11/2020   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   Weakness of both lower extremities 06/11/2020   Hyponatremia 05/22/2020   Gait instability 06/20/2019   Facet arthritis of lumbar region 05/10/2018   MCI (mild cognitive impairment) 11/25/2017   Restrictive lung disease 07/30/2017   Mixed hyperlipidemia 06/18/2017   Rectocele 04/26/2017   Irritable bowel syndrome with both constipation and diarrhea 04/26/2017   Osteoporosis 04/18/2017   Trochanteric bursitis of both hips 02/24/2017   Stress fracture of left  tibia 11/05/2016   Inflammatory arthritis 09/30/2016   High risk medication use 09/30/2016   History of Clostridium difficile colitis 09/30/2016   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   Pseudogout 09/30/2016   DDD cervical spine status post fusion 09/30/2016   DDD thoracic spine 09/30/2016   H/O total knee replacement, right 09/30/2016   Age-related osteoporosis without current pathological fracture 09/30/2016   Tremor, essential 05/14/2016   Right foot pain 04/14/2016   B12 deficiency 09/10/2015   Syncope 09/10/2015   Cervical facet joint syndrome 09/10/2015   Chronic fatigue 09/10/2015   Aortic atherosclerosis ([place]) 04/01/2015   DOE (dyspnea on exertion)    Primary osteoarthritis of right knee 08/13/2014   Benign head tremor 06/11/2014   Lumbar degenerative disc disease 06/11/2014   IFG (impaired fasting glucose) 03/09/2014   Hemorrhoid 03/09/2014   Retinal wrinkling, right eye 03/09/2014   Fibromyalgia 03/09/2014   GERD (gastroesophageal reflux disease) 03/09/2014   History of arthroplasty of right knee 01/31/2014   Memory difficulty 12/14/2013   Hemorrhoids, external, thrombosed 06/22/2011   Hypertension 03/11/2011   SVT (supraventricular tachycardia) ([place])    Raynaud phenomenon    EDEMA 08/25/2010   Nonspecific (abnormal) findings on radiological and other examination of body structure 08/25/2010   COMPUTERIZED TOMOGRAPHY, CHEST, ABNORMAL 08/25/2010   OSA (obstructive sleep apnea) 04/24/2009   Allergic rhinitis 06/11/2008   Dyspnea 06/11/2008   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   Chronic diastolic CHF (congestive heart failure) ([place]) 06/08/2008   INTERSTITIAL CYSTITIS 06/08/2008    Britaney Espaillat, PT 08/06/2021, 1:38 PM  [facility] [address] [place], [place], [address] Phone: [phone]   Fax:  [phone]  Name: LAMIRA BORIN MRN: [id number] Date of Birth: Sep 21, 1951

## 2021-08-07 DIAGNOSIS — M5416 Radiculopathy, lumbar region: Secondary | ICD-10-CM | POA: Diagnosis not present

## 2021-08-13 ENCOUNTER — Ambulatory Visit (INDEPENDENT_AMBULATORY_CARE_PROVIDER_SITE_OTHER): Payer: Medicare Other | Admitting: Physical Therapy

## 2021-08-13 ENCOUNTER — Other Ambulatory Visit: Payer: Self-pay

## 2021-08-13 DIAGNOSIS — R2681 Unsteadiness on feet: Secondary | ICD-10-CM

## 2021-08-13 DIAGNOSIS — R42 Dizziness and giddiness: Secondary | ICD-10-CM

## 2021-08-13 NOTE — Patient Instructions (Signed)
Access Code: [email or web address] URL: [email or web address] Date: 08/13/2021 Prepared by: Isabelle Course  Exercises Seated Gaze Stabilization with Head Rotation - 3-5 x daily - 7 x weekly - 3 sets - 10 reps Seated Horizontal Saccades - 3-5 x daily - 7 x weekly - 3 sets - 10 reps Heel Raises with Counter Support - 1 x daily - 7 x weekly - 2 sets - 10 reps Standing Hip Abduction with Counter Support - 1 x daily - 7 x weekly - 2 sets - 10 reps Standing Knee Flexion with Counter Support - 1 x daily - 7 x weekly - 2 sets - 10 reps

## 2021-08-13 NOTE — Therapy (Signed)
[place] [facility] [place], [place], [address] Phone: [phone]   Fax:  [phone]  Physical Therapy Treatment  Patient Details  Name: NAVAYA WIATREK MRN: [id number] Date of Birth: 1951-02-20 Referring Provider (PT): Metheney   Encounter Date: 08/13/2021   PT End of Session - 08/13/21 1013     Visit Number 2    Number of Visits 6    Date for PT Re-Evaluation 09/17/21    PT Start Time 0930    PT Stop Time 1012    PT Time Calculation (min) 42 min    Activity Tolerance Patient tolerated treatment well    Behavior During Therapy [facility] for tasks assessed/performed             Past Medical History:  Diagnosis Date   Anal fissure    Anemia    Arthritis    Blood transfusion    C. difficile colitis    Chest pain    a. 01/2013 MV: EF 59%, no ischemia.   Chronic Dyspnea    a. 01/2013 Echo: EF 60-65%, Gr 1 DD, PASP 27mmHg.// Echo 01/2020: EF 60-65, no RWMA, GR 1 DD, GLS -21.6%, normal RV SF, trivial MR    COVID-19    DDD (degenerative disc disease), cervical    DDD (degenerative disc disease), lumbar    Diabetes mellitus    Fibromyalgia    Gall stones    GERD (gastroesophageal reflux disease)    gastritis   Hemorrhoids    Hypertension    Interstitial cystitis    MCI (mild cognitive impairment) 11/25/2017   Memory difficulty 12/14/2013   Neuromuscular disorder (HCC)    sclerosis   Osteoporosis    Peptic ulcer    PONV (postoperative nausea and vomiting)    Pseudogout    Raynaud phenomenon    Rectal bleeding    Sleep apnea    a. on cpap.   SVT (supraventricular tachycardia) ([place])    Syncope 09/10/2015   Thyroid disease    hypothyroidism   Tremor, essential 05/14/2016    Past Surgical History:  Procedure Laterality Date   APPENDECTOMY     BLADDER SURGERY     x2   BLADDER SURGERY     BREAST BIOPSY     CARDIAC CATHETERIZATION     CARPAL TUNNEL RELEASE     CATARACT EXTRACTION Bilateral     CHOLECYSTECTOMY     DILATION AND CURETTAGE OF UTERUS     ENTEROCELE REPAIR     x2   EYE SURGERY     retina   hysterectomy - unknown type     JOINT REPLACEMENT     KNEE ARTHROPLASTY     KNEE SURGERY     x6   NECK SURGERY     fusion   OOPHORECTOMY     QUADRICEPS REPAIR Right    RECTOCELE REPAIR     x2   SHOULDER ARTHROSCOPY WITH SUBACROMIAL DECOMPRESSION, ROTATOR CUFF REPAIR AND BICEP TENDON REPAIR  10/06/2012   Procedure: SHOULDER ARTHROSCOPY WITH SUBACROMIAL DECOMPRESSION, ROTATOR CUFF REPAIR AND BICEP TENDON REPAIR;  Surgeon: Nita Sells, MD;  Location: [place];  Service: Orthopedics;  Laterality: Right;  Arthroscopic  Repair  of  Subscapularis, Open Biceps Tenodesis   SHOULDER SURGERY     bilateral- bones spur   SHOULDER SURGERY Left 04/15/2021   TONSILLECTOMY     TOTAL KNEE ARTHROPLASTY     TOTAL SHOULDER ARTHROPLASTY      There  were no vitals filed for this visit.   Subjective Assessment - 08/13/21 0935     Subjective Pt states she went to a buffet restaurant this weekend and had multiple LOB which required her husband to help her prevent falls. Pt states she felt it took "hours" to recover due to feeling "off balance and nauseous"    Patient Stated Goals improve balance to prevent falls    Currently in Pain? No/denies                [facility] PT Assessment - 08/13/21 0001       Assessment   Medical Diagnosis dizziness, balance problem    Referring Provider (PT) Metheney    Onset Date/Surgical Date 06/06/21    Next MD Visit 08/2021    Prior Therapy for Lt shoulder      Standardized Balance Assessment   Standardized Balance Assessment Berg Balance Test      Berg Balance Test   Sit to Stand Able to stand  independently using hands    Standing Unsupported Able to stand 30 seconds unsupported    Sitting with Back Unsupported but Feet Supported on Floor or Stool Able to sit safely and securely 2 minutes    Stand to Sit Uses backs of  legs against chair to control descent    Transfers Able to transfer safely, definite need of hands    Standing Unsupported with Eyes Closed Needs help to keep from falling    Standing Unsupported with Feet Together Needs help to attain position and unable to hold for 15 seconds    From Standing, Reach Forward with Outstretched Arm Reaches forward but needs supervision    From Standing Position, Pick up Object from Floor Unable to try/needs assist to keep balance    From Standing Position, Turn to Look Behind Over each Shoulder Needs supervision when turning    Turn 360 Degrees Needs assistance while turning    Standing Unsupported, Alternately Place Feet on Step/Stool Needs assistance to keep from falling or unable to try    Standing Unsupported, One Foot in Front Needs help to step but can hold 15 seconds    Standing on One Leg Unable to try or needs assist to prevent fall    Total Score 17                            Vestibular Treatment/Exercise - 08/13/21 0001       X1 Viewing Horizontal   Foot Position seated    Reps --   5 x 5 reps (until dizziness 4-5/10)   Comments moderate dizziness                Balance Exercises - 08/13/21 0001       OTAGO PROGRAM   Knee Flexor 20 reps    Hip ABductor 20 reps    Ankle Plantorflexors 20 reps, support                PT Education - 08/13/21 1011     Education Details updated HEP    Person(s) Educated Patient    Methods Explanation;Demonstration;Handout    Comprehension Verbalized understanding;Returned demonstration                 PT Long Term Goals - 08/13/21 1012       PT LONG TERM GOAL #4   Title Pt will improve BERG balance score to >= 30/56 to demo decreased  risk of falls    Time 6    Period Weeks    Status New    Target Date 09/17/21                   Plan - 08/13/21 1014     Clinical Impression Statement Pt continues with moderate dizziness with frequent LOB in  community and busy environments. She demonstrates decreased balance and scored 17/56 on BERG balance test demonstrating high risk of falls. Added balance goal and updated HEP    PT Next Visit Plan progress habituation and balance    PT Home Exercise Plan [phone]    Consulted and Agree with Plan of Care Patient             Patient will benefit from skilled therapeutic intervention in order to improve the following deficits and impairments:     Visit Diagnosis: Unsteadiness on feet  Dizziness and giddiness     Problem List Patient Active Problem List   Diagnosis Date Noted   Pernicious anemia 09/11/2020   Cyst of skin and subcutaneous tissue 09/11/2020   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   Weakness of both lower extremities 06/11/2020   Hyponatremia 05/22/2020   Gait instability 06/20/2019   Facet arthritis of lumbar region 05/10/2018   MCI (mild cognitive impairment) 11/25/2017   Restrictive lung disease 07/30/2017   Mixed hyperlipidemia 06/18/2017   Rectocele 04/26/2017   Irritable bowel syndrome with both constipation and diarrhea 04/26/2017   Osteoporosis 04/18/2017   Trochanteric bursitis of both hips 02/24/2017   Stress fracture of left tibia 11/05/2016   Inflammatory arthritis 09/30/2016   High risk medication use 09/30/2016   History of Clostridium difficile colitis 09/30/2016   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   Pseudogout 09/30/2016   DDD cervical spine status post fusion 09/30/2016   DDD thoracic spine 09/30/2016   H/O total knee replacement, right 09/30/2016   Age-related osteoporosis without current pathological fracture 09/30/2016   Tremor, essential 05/14/2016   Right foot pain 04/14/2016   B12 deficiency 09/10/2015   Syncope 09/10/2015   Cervical facet joint syndrome 09/10/2015   Chronic fatigue 09/10/2015   Aortic atherosclerosis ([place]) 04/01/2015   DOE (dyspnea on exertion)    Primary osteoarthritis of right knee 08/13/2014   Benign  head tremor 06/11/2014   Lumbar degenerative disc disease 06/11/2014   IFG (impaired fasting glucose) 03/09/2014   Hemorrhoid 03/09/2014   Retinal wrinkling, right eye 03/09/2014   Fibromyalgia 03/09/2014   GERD (gastroesophageal reflux disease) 03/09/2014   History of arthroplasty of right knee 01/31/2014   Memory difficulty 12/14/2013   Hemorrhoids, external, thrombosed 06/22/2011   Hypertension 03/11/2011   SVT (supraventricular tachycardia) ([place])    Raynaud phenomenon    EDEMA 08/25/2010   Nonspecific (abnormal) findings on radiological and other examination of body structure 08/25/2010   COMPUTERIZED TOMOGRAPHY, CHEST, ABNORMAL 08/25/2010   OSA (obstructive sleep apnea) 04/24/2009   Allergic rhinitis 06/11/2008   Dyspnea 06/11/2008   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   Chronic diastolic CHF (congestive heart failure) ([place]) 06/08/2008   INTERSTITIAL CYSTITIS 06/08/2008    Nashia Remus, PT 08/13/2021, 10:15 AM  [facility] [place], [place], [address] Phone: [phone]   Fax:  [phone]  Name: TYLIYAH MCMEEKIN MRN: [id number] Date of Birth: [birth year]

## 2021-08-18 MED ORDER — ATORVASTATIN 40 MG TAB
40 mg | ORAL_TABLET | ORAL | 3 refills | Status: AC
Start: 2021-08-18 — End: ?

## 2021-08-19 DIAGNOSIS — M5416 Radiculopathy, lumbar region: Secondary | ICD-10-CM | POA: Diagnosis not present

## 2021-08-20 ENCOUNTER — Other Ambulatory Visit: Payer: Self-pay

## 2021-08-20 ENCOUNTER — Encounter: Payer: Medicare Other | Admitting: Physical Therapy

## 2021-08-20 ENCOUNTER — Ambulatory Visit (INDEPENDENT_AMBULATORY_CARE_PROVIDER_SITE_OTHER): Payer: Medicare Other | Admitting: Family Medicine

## 2021-08-20 VITALS — BP 140/80 | HR 67 | Temp 97.6°F | Wt 171.0 lb

## 2021-08-20 DIAGNOSIS — D510 Vitamin B12 deficiency anemia due to intrinsic factor deficiency: Secondary | ICD-10-CM | POA: Diagnosis not present

## 2021-08-20 MED ORDER — CYANOCOBALAMIN 1000 MCG/ML IJ SOLN
1000.0000 ug | Freq: Once | INTRAMUSCULAR | Status: AC
  Administered 2021-08-20: 1000 ug via INTRAMUSCULAR

## 2021-08-20 NOTE — Progress Notes (Signed)
Patient was here for a vitamin B12 injection. Denies any gastrointestinal problems or dizziness. Patient tolerated injection well without any complications on the LUOQ. Patient advised to schedule next injection in 30 days.

## 2021-08-20 NOTE — Progress Notes (Signed)
Agree with documentation as above.   Star Cheese, MD  

## 2021-08-21 DIAGNOSIS — R7989 Other specified abnormal findings of blood chemistry: Secondary | ICD-10-CM | POA: Diagnosis not present

## 2021-08-26 DIAGNOSIS — R7989 Other specified abnormal findings of blood chemistry: Secondary | ICD-10-CM | POA: Diagnosis not present

## 2021-08-26 DIAGNOSIS — R197 Diarrhea, unspecified: Secondary | ICD-10-CM | POA: Diagnosis not present

## 2021-08-26 DIAGNOSIS — Z20822 Contact with and (suspected) exposure to covid-19: Secondary | ICD-10-CM | POA: Diagnosis not present

## 2021-08-26 DIAGNOSIS — Z23 Encounter for immunization: Secondary | ICD-10-CM | POA: Diagnosis not present

## 2021-08-26 DIAGNOSIS — K219 Gastro-esophageal reflux disease without esophagitis: Secondary | ICD-10-CM | POA: Diagnosis not present

## 2021-08-27 ENCOUNTER — Encounter: Payer: Medicare Other | Admitting: Physical Therapy

## 2021-08-29 DIAGNOSIS — M25512 Pain in left shoulder: Secondary | ICD-10-CM | POA: Diagnosis not present

## 2021-09-03 ENCOUNTER — Encounter: Payer: Medicare Other | Admitting: Physical Therapy

## 2021-09-04 DIAGNOSIS — M47816 Spondylosis without myelopathy or radiculopathy, lumbar region: Secondary | ICD-10-CM | POA: Diagnosis not present

## 2021-09-09 ENCOUNTER — Ambulatory Visit: Payer: Medicare Other | Admitting: Psychology

## 2021-09-09 ENCOUNTER — Other Ambulatory Visit: Payer: Self-pay | Admitting: *Deleted

## 2021-09-09 ENCOUNTER — Encounter: Payer: Medicare Other | Attending: Psychology | Admitting: Psychology

## 2021-09-09 ENCOUNTER — Other Ambulatory Visit: Payer: Self-pay

## 2021-09-09 DIAGNOSIS — F02A0 Dementia in other diseases classified elsewhere, mild, without behavioral disturbance, psychotic disturbance, mood disturbance, and anxiety: Secondary | ICD-10-CM | POA: Insufficient documentation

## 2021-09-09 DIAGNOSIS — Z79899 Other long term (current) drug therapy: Secondary | ICD-10-CM | POA: Diagnosis not present

## 2021-09-09 DIAGNOSIS — M0609 Rheumatoid arthritis without rheumatoid factor, multiple sites: Secondary | ICD-10-CM

## 2021-09-09 DIAGNOSIS — I7300 Raynaud's syndrome without gangrene: Secondary | ICD-10-CM | POA: Diagnosis not present

## 2021-09-09 LAB — CBC WITH DIFFERENTIAL/PLATELET
Absolute Monocytes: 906 cells/uL (ref 200–950)
Basophils Absolute: 80 cells/uL (ref 0–200)
Basophils Relative: 1.4 %
Eosinophils Absolute: 137 cells/uL (ref 15–500)
Eosinophils Relative: 2.4 %
HCT: 43 % (ref 35.0–45.0)
Hemoglobin: 14.4 g/dL (ref 11.7–15.5)
Lymphs Abs: 992 cells/uL (ref 850–3900)
MCH: 30.7 pg (ref 27.0–33.0)
MCHC: 33.5 g/dL (ref 32.0–36.0)
MCV: 91.7 fL (ref 80.0–100.0)
MPV: 10 fL (ref 7.5–12.5)
Monocytes Relative: 15.9 %
Neutro Abs: 3585 cells/uL (ref 1500–7800)
Neutrophils Relative %: 62.9 %
Platelets: 405 10*3/uL — ABNORMAL HIGH (ref 140–400)
RBC: 4.69 10*6/uL (ref 3.80–5.10)
RDW: 14.3 % (ref 11.0–15.0)
Total Lymphocyte: 17.4 %
WBC: 5.7 10*3/uL (ref 3.8–10.8)

## 2021-09-09 LAB — COMPLETE METABOLIC PANEL WITH GFR
AG Ratio: 1.7 (calc) (ref 1.0–2.5)
ALT: 28 U/L (ref 6–29)
AST: 29 U/L (ref 10–35)
Albumin: 4.4 g/dL (ref 3.6–5.1)
Alkaline phosphatase (APISO): 99 U/L (ref 37–153)
BUN: 11 mg/dL (ref 7–25)
CO2: 30 mmol/L (ref 20–32)
Calcium: 10 mg/dL (ref 8.6–10.4)
Chloride: 98 mmol/L (ref 98–110)
Creat: 0.86 mg/dL (ref 0.60–1.00)
Globulin: 2.6 g/dL (calc) (ref 1.9–3.7)
Glucose, Bld: 69 mg/dL (ref 65–99)
Potassium: 4.6 mmol/L (ref 3.5–5.3)
Sodium: 134 mmol/L — ABNORMAL LOW (ref 135–146)
Total Bilirubin: 0.4 mg/dL (ref 0.2–1.2)
Total Protein: 7 g/dL (ref 6.1–8.1)
eGFR: 73 mL/min/{1.73_m2} (ref >=60)

## 2021-09-09 NOTE — Progress Notes (Signed)
08/09/2021 9 AM-10 AM:  Today's visit was an in person visit that was conducted in my outpatient clinic office.  This is a follow-up appointment after previous neuropsychological evaluation by Dr. Darol Destine.  The patient and her husband both report that there has been no observed changes in cognitive functioning and other than balance issues and frustration everything is remained stable since previous testing.  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  The patient acknowledges some short-term memory issues and difficulties with retrieval of information but this is generally stable.  This is likely due to a combination of things and is not consistent with any cortically mediated progressive types of conditions.  Combination of history of PLS, changes in cervical/lumbar spine with history of motor neuron disease, chronic low B12, sleep apnea (compliant with CPAP device), etc.  At this point, I do not think there is any need to conduct any repeat neuropsychological testing.  She is being managed well by her neurology and other medical providers.  The patient is doing some physical therapy and has been followed by orthopedics for her shoulder and Jones fracture in her foot.  The patient should engage with physical therapy and has been going to physical therapy to strengthen leg, shoulder and arm strength.

## 2021-09-10 ENCOUNTER — Telehealth: Payer: Self-pay | Admitting: *Deleted

## 2021-09-10 ENCOUNTER — Ambulatory Visit (INDEPENDENT_AMBULATORY_CARE_PROVIDER_SITE_OTHER): Payer: Medicare Other | Admitting: Physical Therapy

## 2021-09-10 DIAGNOSIS — R2681 Unsteadiness on feet: Secondary | ICD-10-CM | POA: Diagnosis not present

## 2021-09-10 DIAGNOSIS — R42 Dizziness and giddiness: Secondary | ICD-10-CM

## 2021-09-10 NOTE — Progress Notes (Signed)
Sodium is mildly decreased and is stable.  Platelets are mildly elevated.

## 2021-09-10 NOTE — Telephone Encounter (Signed)
Please call pt and advise her that her DMV forms have been completed.  Please be sure to make copies for pt's chart.  Forms placed up front

## 2021-09-10 NOTE — Therapy (Addendum)
[facility]  [place], [place], [address] Phone: [phone]   Fax:  [phone]  Physical Therapy Treatment and Discharge  Patient Details  Name: Mia [name] MRN: [id number] Date of Birth: 08/08/1951 Referring Provider (PT): Metheney   Encounter Date: 09/10/2021   PT End of Session - 09/10/21 1007     Visit Number 3    Number of Visits 6    Date for PT Re-Evaluation 09/17/21    PT Start Time 0930    PT Stop Time 1009    PT Time Calculation (min) 39 min    Activity Tolerance Patient tolerated treatment well    Behavior During Therapy [facility] for tasks assessed/performed             Past Medical History:  Diagnosis Date   Anal fissure    Anemia    Arthritis    Blood transfusion    C. difficile colitis    Chest pain    a. 01/2013 MV: EF 59%, no ischemia.   Chronic Dyspnea    a. 01/2013 Echo: EF 60-65%, Gr 1 DD, PASP 88mHg.// Echo 01/2020: EF 60-65, no RWMA, GR 1 DD, GLS -21.6%, normal RV SF, trivial MR    COVID-19    DDD (degenerative disc disease), cervical    DDD (degenerative disc disease), lumbar    Diabetes mellitus    Fibromyalgia    Gall stones    GERD (gastroesophageal reflux disease)    gastritis   Hemorrhoids    Hypertension    Interstitial cystitis    MCI (mild cognitive impairment) 11/25/2017   Memory difficulty 12/14/2013   Neuromuscular disorder (HCC)    sclerosis   Osteoporosis    Peptic ulcer    PONV (postoperative nausea and vomiting)    Pseudogout    Raynaud phenomenon    Rectal bleeding    Sleep apnea    a. on cpap.   SVT (supraventricular tachycardia) ([place]    Syncope 09/10/2015   Thyroid disease    hypothyroidism   Tremor, essential 05/14/2016    Past Surgical History:  Procedure Laterality Date   APPENDECTOMY     BLADDER SURGERY     x2   BLADDER SURGERY     BREAST BIOPSY     CARDIAC CATHETERIZATION     CARPAL TUNNEL RELEASE     CATARACT EXTRACTION  Bilateral    CHOLECYSTECTOMY     DILATION AND CURETTAGE OF UTERUS     ENTEROCELE REPAIR     x2   EYE SURGERY     retina   hysterectomy - unknown type     JOINT REPLACEMENT     KNEE ARTHROPLASTY     KNEE SURGERY     x6   NECK SURGERY     fusion   OOPHORECTOMY     QUADRICEPS REPAIR Right    RECTOCELE REPAIR     x2   SHOULDER ARTHROSCOPY WITH SUBACROMIAL DECOMPRESSION, ROTATOR CUFF REPAIR AND BICEP TENDON REPAIR  10/06/2012   Procedure: SHOULDER ARTHROSCOPY WITH SUBACROMIAL DECOMPRESSION, ROTATOR CUFF REPAIR AND BICEP TENDON REPAIR;  Surgeon: JNita Sells MD;  [facility]  Service: Orthopedics;  Laterality: Right;  Arthroscopic  Repair  of  Subscapularis, Open Biceps Tenodesis   SHOULDER SURGERY     bilateral- bones spur   SHOULDER SURGERY Left 04/15/2021   TONSILLECTOMY     TOTAL KNEE ARTHROPLASTY     TOTAL SHOULDER ARTHROPLASTY  There were no vitals filed for this visit.   Subjective Assessment - 09/10/21 0937     Subjective Pt states she has just gotten over a bad viral infection and is feeling less "out of sorts".  Pt states she saw doctor yesterday and that he thinks her balance problems are all coming from her brain    Patient Stated Goals improve balance to prevent falls    Currently in Pain? No/denies                [facility] PT Assessment - 09/10/21 0001       Assessment   Medical Diagnosis dizziness, balance problem    Referring Provider (PT) Metheney    Onset Date/Surgical Date 06/06/21    Next MD Visit 08/2021    Prior Therapy for Lt shoulder      Berg Balance Test   Sit to Stand Able to stand using hands after several tries    Standing Unsupported Able to stand 30 seconds unsupported    Sitting with Back Unsupported but Feet Supported on Floor or Stool Able to sit safely and securely 2 minutes    Stand to Sit Uses backs of legs against chair to control descent    Transfers Able to transfer safely, definite need  of hands    Standing Unsupported with Eyes Closed Needs help to keep from falling    Standing Unsupported with Feet Together Needs help to attain position and unable to hold for 15 seconds    From Standing, Reach Forward with Outstretched Arm Reaches forward but needs supervision    From Standing Position, Pick up Object from Floor Unable to pick up shoe, but reaches 2-5 cm (1-2") from shoe and balances independently    From Standing Position, Turn to Look Behind Over each Shoulder Needs supervision when turning    Turn 360 Degrees Needs assistance while turning    Standing Unsupported, Alternately Place Feet on Step/Stool Able to complete >2 steps/needs minimal assist    Standing Unsupported, One Foot in Front Loses balance while stepping or standing    Standing on One Leg Unable to try or needs assist to prevent fall    Total Score 18                 Vestibular Assessment - 09/10/21 0001       Oculomotor Exam   Smooth Pursuits Intact    Saccades Slow    Comment dizziness after 3 reps of saccades                      [facility] Adult PT Treatment/Exercise - 09/10/21 0001       Exercises   Exercises --   nustep x 5 min for warm up            Vestibular Treatment/Exercise - 09/10/21 0001       X1 Viewing Horizontal   Foot Position seated    Reps 8    Comments moderate dizziness after 8 reps      X1 Viewing Vertical   Foot Position seated    Reps 10    Comments mild dizziness                Balance Exercises - 09/10/21 0001       Balance Exercises: Standing   SLS Intermittent upper extremity support;2 reps;30 secs    Other Standing Exercises standing narrow BOS 2 x 30 sec      OTAGO  PROGRAM   Knee Flexor 20 reps   2#   Hip ABductor 20 reps;Weight (comment)   2#   Ankle Plantorflexors 20 reps, support   2#                    PT Long Term Goals - 08/13/21 1012       PT LONG TERM GOAL #4   Title Pt will improve BERG balance  score to >= 30/56 to demo decreased risk of falls    Time 6    Period Weeks    Status New    Target Date 09/17/21                   Plan - 09/10/21 1010     Clinical Impression Statement Pt has not been seen in 4 weeks due to illness. Berg balance score unchanged, pt with some increased tolerance to VOR x 1 and saccades although stil increased dizziness with minimal reps.  Session focused on balance and vestibular habituation with frequent rests due to pt with decreased exercise tolerance this visit    PT Next Visit Plan progress habituation and balance, RECERT    PT Home Exercise Plan [phone]    Consulted and Agree with Plan of Care Patient             Patient will benefit from skilled therapeutic intervention in order to improve the following deficits and impairments:     Visit Diagnosis: Unsteadiness on feet  Dizziness and giddiness     Problem List Patient Active Problem List   Diagnosis Date Noted   Pernicious anemia 09/11/2020   Cyst of skin and subcutaneous tissue 09/11/2020   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   Weakness of both lower extremities 06/11/2020   Hyponatremia 05/22/2020   Gait instability 06/20/2019   Facet arthritis of lumbar region 05/10/2018   MCI (mild cognitive impairment) 11/25/2017   Restrictive lung disease 07/30/2017   Mixed hyperlipidemia 06/18/2017   Rectocele 04/26/2017   Irritable bowel syndrome with both constipation and diarrhea 04/26/2017   Osteoporosis 04/18/2017   Trochanteric bursitis of both hips 02/24/2017   Stress fracture of left tibia 11/05/2016   Inflammatory arthritis 09/30/2016   High risk medication use 09/30/2016   History of Clostridium difficile colitis 09/30/2016   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   Pseudogout 09/30/2016   DDD cervical spine status post fusion 09/30/2016   DDD thoracic spine 09/30/2016   H/O total knee replacement, right 09/30/2016   Age-related osteoporosis without current  pathological fracture 09/30/2016   Tremor, essential 05/14/2016   Right foot pain 04/14/2016   B12 deficiency 09/10/2015   Syncope 09/10/2015   Cervical facet joint syndrome 09/10/2015   Chronic fatigue 09/10/2015   Aortic atherosclerosis ([place]) 04/01/2015   DOE (dyspnea on exertion)    Primary osteoarthritis of right knee 08/13/2014   Benign head tremor 06/11/2014   Lumbar degenerative disc disease 06/11/2014   IFG (impaired fasting glucose) 03/09/2014   Hemorrhoid 03/09/2014   Retinal wrinkling, right eye 03/09/2014   Fibromyalgia 03/09/2014   GERD (gastroesophageal reflux disease) 03/09/2014   History of arthroplasty of right knee 01/31/2014   Memory difficulty 12/14/2013   Hemorrhoids, external, thrombosed 06/22/2011   Hypertension 03/11/2011   SVT (supraventricular tachycardia) ([place])    Raynaud phenomenon    EDEMA 08/25/2010   Nonspecific (abnormal) findings on radiological and other examination of body structure 08/25/2010   COMPUTERIZED TOMOGRAPHY, CHEST, ABNORMAL 08/25/2010   OSA (obstructive sleep apnea) 04/24/2009  Allergic rhinitis 06/11/2008   Dyspnea 06/11/2008   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   Chronic diastolic CHF (congestive heart failure) ([place]) 06/08/2008   INTERSTITIAL CYSTITIS 06/08/2008   PHYSICAL THERAPY DISCHARGE SUMMARY  Visits from Start of Care: 3  Current functional level related to goals / functional outcomes: Pt with minimal progress due to sporadic attendance of PT sessions   Remaining deficits: See above   Education / Equipment: HEP   Patient agrees to discharge. Patient goals were not met. Patient is being discharged due to not returning since the last visit. Isabelle Course, PT,DPT01/08/2309:01 AM   Isabelle Course, PT 09/10/2021, 10:12 AM  [facility] [place] [place], [place], [address] Phone: [phone]   Fax:  [phone]  Name: HARLYN RATHMANN MRN: [id number] Date of Birth: Sep 22, 1951

## 2021-09-10 NOTE — Telephone Encounter (Signed)
Called patient and she needed paperwork faxed. Took payment over the phone and patient was a little upset about the payment, stating she has never had to make a payment before and that she wanted to wait until she came in for her B12 shot but I told her I couldn't fax until I got the payment. Took payment over phone, faxed paperwork for patient. Made copies for patient's chart and placed original in accordion for patient pick up. AM

## 2021-09-16 ENCOUNTER — Telehealth: Payer: Medicare Other | Admitting: Physician Assistant

## 2021-09-16 ENCOUNTER — Telehealth: Payer: Self-pay | Admitting: *Deleted

## 2021-09-16 DIAGNOSIS — B9689 Other specified bacterial agents as the cause of diseases classified elsewhere: Secondary | ICD-10-CM

## 2021-09-16 DIAGNOSIS — J069 Acute upper respiratory infection, unspecified: Secondary | ICD-10-CM

## 2021-09-16 MED ORDER — AZITHROMYCIN 250 MG PO TABS: ORAL_TABLET | ORAL | 0 refills | Status: AC

## 2021-09-16 MED ORDER — BENZONATATE 100 MG PO CAPS: 100.0000 mg | ORAL_CAPSULE | Freq: Three times a day (TID) | ORAL | 0 refills | Status: DC | PRN

## 2021-09-16 MED ORDER — AZITHROMYCIN 250 MG PO TABS: ORAL_TABLET | ORAL | 0 refills | Status: DC

## 2021-09-16 NOTE — Patient Instructions (Signed)
Mia [name], thank you for joining Mar Daring, PA-C for today's virtual visit.  While this provider is not your primary care provider (PCP), if your PCP is located in our provider database this encounter information will be shared with them immediately following your visit.  Consent: (Patient) Mia [name] provided verbal consent for this virtual visit at the beginning of the encounter.  Current Medications:  Current Outpatient Medications:    amLODipine (NORVASC) 5 MG tablet, Take 1 tablet (5 mg total) by mouth daily., Disp: 90 tablet, Rfl: 3   azithromycin (ZITHROMAX) 250 MG tablet, Take 2 tablets on day 1, then 1 tablet daily on days 2 through 5, Disp: 6 tablet, Rfl: 0   benzonatate (TESSALON) 100 MG capsule, Take 1 capsule (100 mg total) by mouth 3 (three) times daily as needed., Disp: 30 capsule, Rfl: 0   colestipol (COLESTID) 1 g tablet, Take 1 g by mouth 2 (two) times daily., Disp: , Rfl:    Cyanocobalamin (VITAMIN B-12 IJ), Inject 1 mL as directed every 30 (thirty) days. , Disp: , Rfl:    denosumab (PROLIA) 60 MG/ML SOLN injection, Inject 60 mg into the skin every 6 (six) months. Administer in upper arm, thigh, or abdomen, Disp: , Rfl:    donepezil (ARICEPT) 10 MG tablet, Take 1 tablet (10 mg total) by mouth at bedtime. Must be seen for further refills, call [phone]., Disp: 90 tablet, Rfl: 0   gabapentin (NEURONTIN) 300 MG capsule, TAKE 1 TO 2 CAPSULES BY MOUTH UP TO TWO TIMES DAILY AS NEEDED, Disp: 180 capsule, Rfl: 3   ibuprofen (ADVIL) 600 MG tablet, ibuprofen 600 mg tablet  TAKE 1 TABLET BY MOUTH EVERY 6 HOURS AS NEEDED, Disp: , Rfl:    ipratropium (ATROVENT) 0.03 % nasal spray, Place 2 sprays into both nostrils every 12 (twelve) hours., Disp: 30 mL, Rfl: 1   leflunomide (ARAVA) 20 MG tablet, TAKE ONE TABLET BY MOUTH DAILY, Disp: 90 tablet, Rfl: 0   losartan (COZAAR) 50 MG tablet, Take 2 tablets (100 mg total) by mouth daily. Take 1 tablet in the morning and 1 tablet  in evening as needed for blood pressure greater than 150, Disp: 180 tablet, Rfl: 3   Melatonin 10 MG CAPS, Take 10 capsules by mouth daily., Disp: , Rfl:    memantine (NAMENDA) 10 MG tablet, Take 1 tablet (10 mg total) by mouth 2 (two) times daily., Disp: 180 tablet, Rfl: 4   metoprolol tartrate (LOPRESSOR) 100 MG tablet, TAKE 1 AND 1/2 TABLETS TWICE DAILY, Disp: 45 tablet, Rfl: 0   ondansetron (ZOFRAN-ODT) 8 MG disintegrating tablet, Take 1 tablet by mouth as needed for nausea., Disp: , Rfl:    pantoprazole (PROTONIX) 40 MG tablet, Take 40 mg by mouth 2 (two) times daily., Disp: , Rfl:    polyethylene glycol powder (GLYCOLAX/MIRALAX) 17 GM/SCOOP powder, Take 17 g by mouth daily as needed for mild constipation or moderate constipation. , Disp: , Rfl:    primidone (MYSOLINE) 50 MG tablet, TAKE 1 TABLET AT BEDTIME, Disp: 90 tablet, Rfl: 1   venlafaxine XR (EFFEXOR-XR) 75 MG 24 hr capsule, TAKE 1 CAPSULE EVERY DAY WITH BREAKFAST. Must be seen for further refills, call [phone]., Disp: 90 capsule, Rfl: 0   Medications ordered in this encounter:  Meds ordered this encounter  Medications   DISCONTD: azithromycin (ZITHROMAX) 250 MG tablet    Sig: Take 2 tablets on day 1, then 1 tablet daily on days 2 through 5  Dispense:  6 tablet    Refill:  0    Order Specific Question:   Supervising Provider    Answer:   Noemi Chapel [[id number]]   DISCONTD: benzonatate (TESSALON) 100 MG capsule    Sig: Take 1 capsule (100 mg total) by mouth 3 (three) times daily as needed.    Dispense:  30 capsule    Refill:  0    Order Specific Question:   Supervising Provider    Answer:   MILLER, BRIAN [[id number]]   azithromycin (ZITHROMAX) 250 MG tablet    Sig: Take 2 tablets on day 1, then 1 tablet daily on days 2 through 5    Dispense:  6 tablet    Refill:  0    Order Specific Question:   Supervising Provider    Answer:   MILLER, BRIAN [[id number]]   benzonatate (TESSALON) 100 MG capsule    Sig: Take 1 capsule (100 mg total)  by mouth 3 (three) times daily as needed.    Dispense:  30 capsule    Refill:  0    Order Specific Question:   Supervising Provider    Answer:   Sabra Heck, [place]     *If you need refills on other medications prior to your next appointment, please contact your pharmacy*  Follow-Up: Call back or seek an in-person evaluation if the symptoms worsen or if the condition fails to improve as anticipated.  Other Instructions Upper Respiratory Infection, Adult An upper respiratory infection (URI) affects the nose, throat, and upper airways that lead to the lungs. The most common type of URI is often called the common cold. URIs usually get better on their own, without medical treatment. What are the causes? A URI is caused by a germ (virus). You may catch these germs by: Breathing in droplets from an infected person's cough or sneeze. Touching something that has the germ on it (is contaminated) and then touching your mouth, nose, or eyes. What increases the risk? You are more likely to get a URI if: You are very young or very old. You have close contact with others, such as at work, school, or a health care facility. You smoke. You have long-term (chronic) heart or lung disease. You have a weakened disease-fighting system (immune system). You have nasal allergies or asthma. You have a lot of stress. You have poor nutrition. What are the signs or symptoms? Runny or stuffy (congested) nose. Cough. Sneezing. Sore throat. Headache. Feeling tired (fatigue). Fever. Not wanting to eat as much as usual. Pain in your forehead, behind your eyes, and over your cheekbones (sinus pain). Muscle aches. Redness or irritation of the eyes. Pressure in the ears or face. How is this treated? URIs usually get better on their own within 7-10 days. Medicines cannot cure URIs, but your doctor may recommend certain medicines to help relieve symptoms, such as: Over-the-counter cold medicines. Medicines  to reduce coughing (cough suppressants). Coughing is a type of defense against infection that helps to clear the nose, throat, windpipe, and lungs (respiratory system). Take these medicines only as told by your doctor. Medicines to lower your fever. Follow these instructions at home: Activity Rest as needed. If you have a fever, stay home from work or school until your fever is gone, or until your doctor says you may return to work or school. You should stay home until you cannot spread the infection anymore (you are not contagious). Your doctor may have you wear a face mask so you  have less risk of spreading the infection. Relieving symptoms Rinse your mouth often with salt water. To make salt water, dissolve -1 tsp (3-6 g) of salt in 1 cup (237 mL) of warm water. Use a cool-mist humidifier to add moisture to the air. This can help you breathe more easily. Eating and drinking  Drink enough fluid to keep your pee (urine) pale yellow. Eat soups and other clear broths. General instructions  Take over-the-counter and prescription medicines only as told by your doctor. Do not smoke or use any products that contain nicotine or tobacco. If you need help quitting, ask your doctor. Avoid being where people are smoking (avoid secondhand smoke). Stay up to date on all your shots (immunizations), and get the flu shot every year. Keep all follow-up visits. How to prevent the spread of infection to others  Wash your hands with soap and water for at least 20 seconds. If you cannot use soap and water, use hand sanitizer. Avoid touching your mouth, face, eyes, or nose. Cough or sneeze into a tissue or your sleeve or elbow. Do not cough or sneeze into your hand or into the air. Contact a doctor if: You are getting worse, not better. You have any of these: A fever or chills. Brown or red mucus in your nose. Yellow or brown fluid (discharge)coming from your nose. Pain in your face, especially when you  bend forward. Swollen neck glands. Pain when you swallow. White areas in the back of your throat. Get help right away if: You have shortness of breath that gets worse. You have very bad or constant: Headache. Ear pain. Pain in your forehead, behind your eyes, and over your cheekbones (sinus pain). Chest pain. You have long-lasting (chronic) lung disease along with any of these: Making high-pitched whistling sounds when you breathe, most often when you breathe out (wheezing). Long-lasting cough (more than 14 days). Coughing up blood. A change in your usual mucus. You have a stiff neck. You have changes in your: Vision. Hearing. Thinking. Mood. These symptoms may be an emergency. Get help right away. Call 911. Do not wait to see if the symptoms will go away. Do not drive yourself to the hospital. Summary An upper respiratory infection (URI) is caused by a germ (virus). The most common type of URI is often called the common cold. URIs usually get better within 7-10 days. Take over-the-counter and prescription medicines only as told by your doctor. This information is not intended to replace advice given to you by your health care provider. Make sure you discuss any questions you have with your health care provider. Document Revised: 05/14/2021 Document Reviewed: 05/14/2021 Elsevier Patient Education  2022 [employer].    If you have been instructed to have an in-person evaluation today at a local Urgent Care facility, please use the link below. It will take you to a list of all of our available [facility], including address, phone number and hours of operation. Please do not delay care.  [facility]  If you or a family member do not have a primary care provider, use the link below to schedule a visit and establish care. When you choose a [place] primary care physician or advanced practice provider, you gain a long-term partner in health. Find a  Primary Care Provider  Learn more about 's in-office and virtual care options: [facility]

## 2021-09-16 NOTE — Telephone Encounter (Signed)
error 

## 2021-09-16 NOTE — Progress Notes (Signed)
Virtual Visit Consent   Mia [name], you are scheduled for a virtual visit with a [facility] provider today.     Just as with appointments in the office, your consent must be obtained to participate.  Your consent will be active for this visit and any virtual visit you may have with one of our providers in the next 365 days.     If you have a MyChart account, a copy of this consent can be sent to you electronically.  All virtual visits are billed to your insurance company just like a traditional visit in the office.    As this is a virtual visit, video technology does not allow for your provider to perform a traditional examination.  This may limit your provider's ability to fully assess your condition.  If your provider identifies any concerns that need to be evaluated in person or the need to arrange testing (such as labs, EKG, etc.), we will make arrangements to do so.     Although advances in technology are sophisticated, we cannot ensure that it will always work on either your end or our end.  If the connection with a video visit is poor, the visit may have to be switched to a telephone visit.  With either a video or telephone visit, we are not always able to ensure that we have a secure connection.     I need to obtain your verbal consent now.   Are you willing to proceed with your visit today?    Mia [name] has provided verbal consent on 09/16/2021 for a virtual visit (video or telephone).   Mar Daring, PA-C   Date: 09/16/2021 3:05 PM   Virtual Visit via Video Note   I, Mar Daring, connected with  Mia [name]  ([id number], 05/10/1951) on 09/16/21 at  3:00 PM EST by a video-enabled telemedicine application and verified that I am speaking with the correct person using two identifiers.  Location: Patient: Virtual Visit Location Patient: Home Provider: Virtual Visit Location Provider: Home Office   I discussed the limitations of evaluation and management by  telemedicine and the availability of in person appointments. The patient expressed understanding and agreed to proceed.    History of Present Illness: Mia [name] is a 70 y.o. who identifies as a female who was assigned female at birth, and is being seen today for URI symptoms.  HPI: URI  This is a new problem. The current episode started 1 to 4 weeks ago (3.5 weeks). Associated symptoms include congestion, coughing (dry), headaches, a plugged ear sensation, rhinorrhea, sinus pain and a sore throat (scratchy tickle). Pertinent negatives include no ear pain. She has tried decongestant, sleep and increased fluids (cough medications) for the symptoms. The treatment provided mild relief.   Covid testing has been negative.  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   Weakness of both lower extremities 06/11/2020   Hyponatremia 05/22/2020   Gait instability 06/20/2019   Facet arthritis of lumbar region 05/10/2018   MCI (mild cognitive impairment) 11/25/2017   Restrictive lung disease 07/30/2017   Mixed hyperlipidemia 06/18/2017   Rectocele 04/26/2017   Irritable bowel syndrome with both constipation and diarrhea 04/26/2017   Osteoporosis 04/18/2017   Trochanteric bursitis of both hips 02/24/2017   Stress fracture of left tibia 11/05/2016   Inflammatory arthritis 09/30/2016  High risk medication use 09/30/2016   History of Clostridium difficile colitis 09/30/2016   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   Pseudogout 09/30/2016   DDD cervical spine status post fusion 09/30/2016   DDD thoracic spine 09/30/2016   H/O total knee replacement, right 09/30/2016   Age-related osteoporosis without current pathological fracture 09/30/2016   Tremor, essential 05/14/2016   Right foot pain 04/14/2016   B12 deficiency 09/10/2015   Syncope 09/10/2015    Cervical facet joint syndrome 09/10/2015   Chronic fatigue 09/10/2015   Aortic atherosclerosis ([place]) 04/01/2015   DOE (dyspnea on exertion)    Primary osteoarthritis of right knee 08/13/2014   Benign head tremor 06/11/2014   Lumbar degenerative disc disease 06/11/2014   IFG (impaired fasting glucose) 03/09/2014   Hemorrhoid 03/09/2014   Retinal wrinkling, right eye 03/09/2014   Fibromyalgia 03/09/2014   GERD (gastroesophageal reflux disease) 03/09/2014   History of arthroplasty of right knee 01/31/2014   Memory difficulty 12/14/2013   Hemorrhoids, external, thrombosed 06/22/2011   Hypertension 03/11/2011   SVT (supraventricular tachycardia) ([place])    Raynaud phenomenon    EDEMA 08/25/2010   Nonspecific (abnormal) findings on radiological and other examination of body structure 08/25/2010   COMPUTERIZED TOMOGRAPHY, CHEST, ABNORMAL 08/25/2010   OSA (obstructive sleep apnea) 04/24/2009   Allergic rhinitis 06/11/2008   Dyspnea 06/11/2008   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   Chronic diastolic CHF (congestive heart failure) ([place]) 06/08/2008   INTERSTITIAL CYSTITIS 06/08/2008    Allergies:  Allergies  Allergen Reactions   Codeine Nausea And Vomiting   Myrbetriq  [Mirabegron] Other (See Comments)    SEVERE HEADACHE   Percocet [Oxycodone-Acetaminophen] Nausea And Vomiting   Propoxyphene Nausea And Vomiting   Nystatin    Celebrex [Celecoxib] Other (See Comments)    Other reaction(s): Other flushed   Darvocet [Propoxyphene N-Acetaminophen] Nausea And Vomiting   Erythromycin Nausea And Vomiting    Nausea and vomiting    Hydrocodone Nausea And Vomiting   Lyrica [Pregabalin] Swelling    Legs swelling    Macrodantin [Nitrofurantoin] Nausea And Vomiting   Toradol [Ketorolac Tromethamine] Nausea And Vomiting    Per patient, only PO form causes nausea and vomiting. Can take injection without issue   Tramadol Nausea And Vomiting   Verapamil Nausea And Vomiting     REACTION: intolerance   Medications:  Current Outpatient Medications:    amLODipine (NORVASC) 5 MG tablet, Take 1 tablet (5 mg total) by mouth daily., Disp: 90 tablet, Rfl: 3   azithromycin (ZITHROMAX) 250 MG tablet, Take 2 tablets on day 1, then 1 tablet daily on days 2 through 5, Disp: 6 tablet, Rfl: 0   benzonatate (TESSALON) 100 MG capsule, Take 1 capsule (100 mg total) by mouth 3 (three) times daily as needed., Disp: 30 capsule, Rfl: 0   colestipol (COLESTID) 1 g tablet, Take 1 g by mouth 2 (two) times daily., Disp: , Rfl:    Cyanocobalamin (VITAMIN B-12 IJ), Inject 1 mL as directed every 30 (thirty) days. , Disp: , Rfl:    denosumab (PROLIA) 60 MG/ML SOLN injection, Inject 60 mg into the skin every 6 (six) months. Administer in upper arm, thigh, or abdomen, Disp: , Rfl:    donepezil (ARICEPT) 10 MG tablet, Take 1 tablet (10 mg total) by mouth at bedtime. Must be seen for further refills, call [phone]., Disp: 90 tablet, Rfl: 0   gabapentin (NEURONTIN) 300 MG capsule, TAKE 1 TO 2 CAPSULES BY MOUTH UP TO TWO TIMES DAILY AS NEEDED, Disp: 180  capsule, Rfl: 3   ibuprofen (ADVIL) 600 MG tablet, ibuprofen 600 mg tablet  TAKE 1 TABLET BY MOUTH EVERY 6 HOURS AS NEEDED, Disp: , Rfl:    ipratropium (ATROVENT) 0.03 % nasal spray, Place 2 sprays into both nostrils every 12 (twelve) hours., Disp: 30 mL, Rfl: 1   leflunomide (ARAVA) 20 MG tablet, TAKE ONE TABLET BY MOUTH DAILY, Disp: 90 tablet, Rfl: 0   losartan (COZAAR) 50 MG tablet, Take 2 tablets (100 mg total) by mouth daily. Take 1 tablet in the morning and 1 tablet in evening as needed for blood pressure greater than 150, Disp: 180 tablet, Rfl: 3   Melatonin 10 MG CAPS, Take 10 capsules by mouth daily., Disp: , Rfl:    memantine (NAMENDA) 10 MG tablet, Take 1 tablet (10 mg total) by mouth 2 (two) times daily., Disp: 180 tablet, Rfl: 4   metoprolol tartrate (LOPRESSOR) 100 MG tablet, TAKE 1 AND 1/2 TABLETS TWICE DAILY, Disp: 45 tablet, Rfl: 0    ondansetron (ZOFRAN-ODT) 8 MG disintegrating tablet, Take 1 tablet by mouth as needed for nausea., Disp: , Rfl:    pantoprazole (PROTONIX) 40 MG tablet, Take 40 mg by mouth 2 (two) times daily., Disp: , Rfl:    polyethylene glycol powder (GLYCOLAX/MIRALAX) 17 GM/SCOOP powder, Take 17 g by mouth daily as needed for mild constipation or moderate constipation. , Disp: , Rfl:    primidone (MYSOLINE) 50 MG tablet, TAKE 1 TABLET AT BEDTIME, Disp: 90 tablet, Rfl: 1   venlafaxine XR (EFFEXOR-XR) 75 MG 24 hr capsule, TAKE 1 CAPSULE EVERY DAY WITH BREAKFAST. Must be seen for further refills, call [phone]., Disp: 90 capsule, Rfl: 0  Observations/Objective: Patient is well-developed, well-nourished in no acute distress.  Resting comfortably at home.  Head is normocephalic, atraumatic.  No labored breathing.  Speech is clear and coherent with logical content.  Patient is alert and oriented at baseline.    Assessment and Plan: 1. Bacterial upper respiratory infection - azithromycin (ZITHROMAX) 250 MG tablet; Take 2 tablets on day 1, then 1 tablet daily on days 2 through 5  Dispense: 6 tablet; Refill: 0 - benzonatate (TESSALON) 100 MG capsule; Take 1 capsule (100 mg total) by mouth 3 (three) times daily as needed.  Dispense: 30 capsule; Refill: 0  - Symptoms present over 3 weeks - Will treat with Zpak and tessalon perles - Push fluids - Rest - Seek in person evaluation if not improving or continues to worsen  Follow Up Instructions: I discussed the assessment and treatment plan with the patient. The patient was provided an opportunity to ask questions and all were answered. The patient agreed with the plan and demonstrated an understanding of the instructions.  A copy of instructions were sent to the patient via MyChart unless otherwise noted below.   The patient was advised to call back or seek an in-person evaluation if the symptoms worsen or if the condition fails to improve as  anticipated.  Time:  I spent 12 minutes with the patient via telehealth technology discussing the above problems/concerns.    Mar Daring, PA-C

## 2021-09-17 ENCOUNTER — Encounter: Payer: Medicare Other | Admitting: Physical Therapy

## 2021-09-23 ENCOUNTER — Encounter: Payer: Medicare Other | Admitting: Physical Therapy

## 2021-09-24 ENCOUNTER — Telehealth: Payer: Self-pay | Admitting: *Deleted

## 2021-09-24 ENCOUNTER — Ambulatory Visit: Payer: Medicare Other

## 2021-09-24 NOTE — Telephone Encounter (Signed)
Form completed,faxed,confirmation received and scanned into patient's chart. 

## 2021-09-25 ENCOUNTER — Ambulatory Visit (INDEPENDENT_AMBULATORY_CARE_PROVIDER_SITE_OTHER): Payer: Medicare Other | Admitting: Family Medicine

## 2021-09-25 ENCOUNTER — Other Ambulatory Visit: Payer: Self-pay

## 2021-09-25 ENCOUNTER — Ambulatory Visit (HOSPITAL_BASED_OUTPATIENT_CLINIC_OR_DEPARTMENT_OTHER): Admission: RE | Admit: 2021-09-25 | Payer: Medicare Other | Source: Ambulatory Visit | Admitting: Orthopedic Surgery

## 2021-09-25 ENCOUNTER — Encounter (HOSPITAL_BASED_OUTPATIENT_CLINIC_OR_DEPARTMENT_OTHER): Admission: RE | Payer: Self-pay | Source: Ambulatory Visit

## 2021-09-25 VITALS — BP 108/63 | HR 70 | Temp 97.5°F

## 2021-09-25 DIAGNOSIS — Z20822 Contact with and (suspected) exposure to covid-19: Secondary | ICD-10-CM | POA: Diagnosis not present

## 2021-09-25 DIAGNOSIS — E538 Deficiency of other specified B group vitamins: Secondary | ICD-10-CM | POA: Diagnosis not present

## 2021-09-25 DIAGNOSIS — D510 Vitamin B12 deficiency anemia due to intrinsic factor deficiency: Secondary | ICD-10-CM | POA: Diagnosis not present

## 2021-09-25 SURGERY — OPEN REDUCTION INTERNAL FIXATION (ORIF) METATARSAL (TOE) FRACTURE
Anesthesia: General | Site: Toe | Laterality: Right

## 2021-09-25 MED ORDER — CYANOCOBALAMIN 1000 MCG/ML IJ SOLN
1000.0000 ug | Freq: Once | INTRAMUSCULAR | Status: AC
  Administered 2021-09-25: 1000 ug via INTRAMUSCULAR

## 2021-09-25 NOTE — Progress Notes (Signed)
Patient presents today for cyanocobalamin 1000 mcg/1 ml injection. Patient is scheduled to get this injection every 30 days. Patients last injection was given on 08/20/21 in the [facility].   Patient denies CP, palpitations, ShOB, dizziness/lightheadedness, abdominal pain, GI upset, headache, and mood swings.   Injection given in [facility] at pts request. Pt tolerated injection well without complications. Pt instructed to stop at the front desk to schedule a nurse visit for the next injection that will be due in 30 days.

## 2021-09-25 NOTE — Patient Instructions (Signed)

## 2021-09-29 ENCOUNTER — Other Ambulatory Visit: Payer: Self-pay | Admitting: Cardiovascular Disease

## 2021-10-02 DIAGNOSIS — Z90710 Acquired absence of both cervix and uterus: Secondary | ICD-10-CM | POA: Diagnosis not present

## 2021-10-02 DIAGNOSIS — Z888 Allergy status to other drugs, medicaments and biological substances status: Secondary | ICD-10-CM | POA: Diagnosis not present

## 2021-10-02 DIAGNOSIS — Z9049 Acquired absence of other specified parts of digestive tract: Secondary | ICD-10-CM | POA: Diagnosis not present

## 2021-10-02 DIAGNOSIS — K219 Gastro-esophageal reflux disease without esophagitis: Secondary | ICD-10-CM | POA: Diagnosis not present

## 2021-10-02 DIAGNOSIS — Z886 Allergy status to analgesic agent status: Secondary | ICD-10-CM | POA: Diagnosis not present

## 2021-10-02 DIAGNOSIS — I10 Essential (primary) hypertension: Secondary | ICD-10-CM | POA: Diagnosis not present

## 2021-10-02 DIAGNOSIS — Z881 Allergy status to other antibiotic agents status: Secondary | ICD-10-CM | POA: Diagnosis not present

## 2021-10-02 DIAGNOSIS — R1011 Right upper quadrant pain: Secondary | ICD-10-CM | POA: Diagnosis not present

## 2021-10-02 DIAGNOSIS — Z79899 Other long term (current) drug therapy: Secondary | ICD-10-CM | POA: Diagnosis not present

## 2021-10-02 DIAGNOSIS — Z885 Allergy status to narcotic agent status: Secondary | ICD-10-CM | POA: Diagnosis not present

## 2021-10-02 DIAGNOSIS — E119 Type 2 diabetes mellitus without complications: Secondary | ICD-10-CM | POA: Diagnosis not present

## 2021-10-02 DIAGNOSIS — Z9981 Dependence on supplemental oxygen: Secondary | ICD-10-CM | POA: Diagnosis not present

## 2021-10-02 DIAGNOSIS — G4733 Obstructive sleep apnea (adult) (pediatric): Secondary | ICD-10-CM | POA: Diagnosis not present

## 2021-10-02 DIAGNOSIS — K580 Irritable bowel syndrome with diarrhea: Secondary | ICD-10-CM | POA: Diagnosis not present

## 2021-10-02 DIAGNOSIS — I471 Supraventricular tachycardia: Secondary | ICD-10-CM | POA: Diagnosis not present

## 2021-10-02 DIAGNOSIS — R7989 Other specified abnormal findings of blood chemistry: Secondary | ICD-10-CM | POA: Diagnosis not present

## 2021-10-02 DIAGNOSIS — I2510 Atherosclerotic heart disease of native coronary artery without angina pectoris: Secondary | ICD-10-CM | POA: Diagnosis not present

## 2021-10-02 DIAGNOSIS — E039 Hypothyroidism, unspecified: Secondary | ICD-10-CM | POA: Diagnosis not present

## 2021-10-02 DIAGNOSIS — M069 Rheumatoid arthritis, unspecified: Secondary | ICD-10-CM | POA: Diagnosis not present

## 2021-10-06 ENCOUNTER — Telehealth: Payer: Self-pay | Admitting: Adult Health

## 2021-10-06 ENCOUNTER — Encounter: Payer: Self-pay | Admitting: Cardiovascular Disease

## 2021-10-06 ENCOUNTER — Telehealth: Payer: Self-pay | Admitting: *Deleted

## 2021-10-06 DIAGNOSIS — Z85828 Personal history of other malignant neoplasm of skin: Secondary | ICD-10-CM | POA: Diagnosis not present

## 2021-10-06 DIAGNOSIS — L821 Other seborrheic keratosis: Secondary | ICD-10-CM | POA: Diagnosis not present

## 2021-10-06 DIAGNOSIS — D235 Other benign neoplasm of skin of trunk: Secondary | ICD-10-CM | POA: Diagnosis not present

## 2021-10-06 DIAGNOSIS — L981 Factitial dermatitis: Secondary | ICD-10-CM | POA: Diagnosis not present

## 2021-10-06 DIAGNOSIS — L579 Skin changes due to chronic exposure to nonionizing radiation, unspecified: Secondary | ICD-10-CM | POA: Diagnosis not present

## 2021-10-06 DIAGNOSIS — M25512 Pain in left shoulder: Secondary | ICD-10-CM | POA: Diagnosis not present

## 2021-10-06 DIAGNOSIS — L814 Other melanin hyperpigmentation: Secondary | ICD-10-CM | POA: Diagnosis not present

## 2021-10-06 NOTE — Telephone Encounter (Signed)
Pt has called to report that she has received a letter that threatens to end her driving privileges as a result of her being on memantine (NAMENDA) 10 MG tablet  & donepezil (ARICEPT) 10 MG tablet & venlafaxine XR (EFFEXOR-XR) 75 MG 24 hr capsule Pt has asked if she brings the form first thing in the morning can Jinny Blossom, NP complete the form for her sine she is expected to surrender her license by  this coming Sunday.  Pt was informed providers are permitted a few days to complete forms & letter request.  Pt stated she will stop taking the above mentioned medications before she stops driving. Pt is asking for a call .

## 2021-10-06 NOTE — Telephone Encounter (Signed)
Pt stated that she is so upset and she doesn't know what to do.   She stated that she received a letter from [facility] that states that she will need to turn in her license. She stated that the form that was filled out stated that she has emotional and mental issues, cardiovascular issues (SVT),   She is asking that Dr. Madilyn Fireman write a letter on her behalf.  Spoke w/Dr. Madilyn Fireman she stated that she filled out her forms the same as she did last year and that she should f/u with Neurology to get this cleared for her to drive.

## 2021-10-07 NOTE — Telephone Encounter (Signed)
I spoke to the patient. She has been scheduled for a follow up appt and placed on wait list. She will hold on to the paperwork until after she is seen for an updated evaluation.

## 2021-10-09 ENCOUNTER — Telehealth: Payer: Medicare Other

## 2021-10-09 DIAGNOSIS — M5126 Other intervertebral disc displacement, lumbar region: Secondary | ICD-10-CM | POA: Diagnosis not present

## 2021-10-13 ENCOUNTER — Telehealth: Payer: Medicare Other | Admitting: Physician Assistant

## 2021-10-13 DIAGNOSIS — J208 Acute bronchitis due to other specified organisms: Secondary | ICD-10-CM

## 2021-10-13 DIAGNOSIS — J019 Acute sinusitis, unspecified: Secondary | ICD-10-CM

## 2021-10-13 DIAGNOSIS — B9689 Other specified bacterial agents as the cause of diseases classified elsewhere: Secondary | ICD-10-CM

## 2021-10-13 MED ORDER — PREDNISONE 20 MG PO TABS: 40.0000 mg | ORAL_TABLET | Freq: Every day | ORAL | 0 refills | Status: DC

## 2021-10-13 MED ORDER — PROMETHAZINE-DM 6.25-15 MG/5ML PO SYRP: 5.0000 mL | ORAL_SOLUTION | Freq: Four times a day (QID) | ORAL | 0 refills | Status: DC | PRN

## 2021-10-13 MED ORDER — DOXYCYCLINE HYCLATE 100 MG PO TABS
100.0000 mg | ORAL_TABLET | Freq: Two times a day (BID) | ORAL | 0 refills | Status: DC
Start: 2021-10-13 — End: 2021-11-20

## 2021-10-13 NOTE — Progress Notes (Signed)
Virtual Visit Consent   Mia [name], you are scheduled for a virtual visit with a [facility] provider today.     Just as with appointments in the office, your consent must be obtained to participate.  Your consent will be active for this visit and any virtual visit you may have with one of our providers in the next 365 days.     If you have a MyChart account, a copy of this consent can be sent to you electronically.  All virtual visits are billed to your insurance company just like a traditional visit in the office.    As this is a virtual visit, video technology does not allow for your provider to perform a traditional examination.  This may limit your provider's ability to fully assess your condition.  If your provider identifies any concerns that need to be evaluated in person or the need to arrange testing (such as labs, EKG, etc.), we will make arrangements to do so.     Although advances in technology are sophisticated, we cannot ensure that it will always work on either your end or our end.  If the connection with a video visit is poor, the visit may have to be switched to a telephone visit.  With either a video or telephone visit, we are not always able to ensure that we have a secure connection.     I need to obtain your verbal consent now.   Are you willing to proceed with your visit today?    Lanetra Hartley Retana has provided verbal consent on 10/13/2021 for a virtual visit (video or telephone).   Mar Daring, PA-C   Date: 10/13/2021 4:19 PM   Virtual Visit via Video Note   I, Mar Daring, connected with  Daizy Outen Albanese  ([id number], [birth year]) on 10/13/21 at  4:15 PM EST by a video-enabled telemedicine application and verified that I am speaking with the correct person using two identifiers.  Location: Patient: Virtual Visit Location Patient: Home Provider: Virtual Visit Location Provider: Home Office   I discussed the limitations of evaluation and management by  telemedicine and the availability of in person appointments. The patient expressed understanding and agreed to proceed.    History of Present Illness: BRINSLEY WENCE is a 70 y.o. who identifies as a female who was assigned female at birth, and is being seen today for cough and headache.  HPI: Cough This is a new problem. Episode onset: Was seen on 09/16/21 and was treated for bacterial bronchitis with zpack and tessalon perles. Symptoms improved, and started to returning about a week ago. The problem has been gradually worsening. The problem occurs every few minutes. The cough is Productive of sputum and productive of purulent sputum. Associated symptoms include headaches, nasal congestion, postnasal drip, rhinorrhea, shortness of breath, sweats and wheezing. Pertinent negatives include no chills, fever, hemoptysis, myalgias or sore throat. She has tried prescription cough suppressant and OTC cough suppressant (tessalon, robitussin) for the symptoms. The treatment provided no relief. Her past medical history is significant for bronchitis. 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   Weakness of both lower extremities 06/11/2020   Hyponatremia 05/22/2020   Gait instability 06/20/2019   Facet arthritis of lumbar region 05/10/2018   MCI (mild cognitive impairment) 11/25/2017   Restrictive lung disease 07/30/2017  Mixed hyperlipidemia 06/18/2017   Rectocele 04/26/2017   Irritable bowel syndrome with both constipation and diarrhea 04/26/2017   Osteoporosis 04/18/2017   Trochanteric bursitis of both hips 02/24/2017   Stress fracture of left tibia 11/05/2016   Inflammatory arthritis 09/30/2016   High risk medication use 09/30/2016   History of Clostridium difficile colitis 09/30/2016   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   Pseudogout 09/30/2016    DDD cervical spine status post fusion 09/30/2016   DDD thoracic spine 09/30/2016   H/O total knee replacement, right 09/30/2016   Age-related osteoporosis without current pathological fracture 09/30/2016   Tremor, essential 05/14/2016   Right foot pain 04/14/2016   B12 deficiency 09/10/2015   Syncope 09/10/2015   Cervical facet joint syndrome 09/10/2015   Chronic fatigue 09/10/2015   Aortic atherosclerosis ([place]) 04/01/2015   DOE (dyspnea on exertion)    Primary osteoarthritis of right knee 08/13/2014   Benign head tremor 06/11/2014   Lumbar degenerative disc disease 06/11/2014   IFG (impaired fasting glucose) 03/09/2014   Hemorrhoid 03/09/2014   Retinal wrinkling, right eye 03/09/2014   Fibromyalgia 03/09/2014   GERD (gastroesophageal reflux disease) 03/09/2014   History of arthroplasty of right knee 01/31/2014   Memory difficulty 12/14/2013   Hemorrhoids, external, thrombosed 06/22/2011   Hypertension 03/11/2011   SVT (supraventricular tachycardia) ([place])    Raynaud phenomenon    EDEMA 08/25/2010   Nonspecific (abnormal) findings on radiological and other examination of body structure 08/25/2010   COMPUTERIZED TOMOGRAPHY, CHEST, ABNORMAL 08/25/2010   OSA (obstructive sleep apnea) 04/24/2009   Allergic rhinitis 06/11/2008   Dyspnea 06/11/2008   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   Chronic diastolic CHF (congestive heart failure) ([place]) 06/08/2008   INTERSTITIAL CYSTITIS 06/08/2008    Allergies:  Allergies  Allergen Reactions   Codeine Nausea And Vomiting   Myrbetriq  [Mirabegron] Other (See Comments)    SEVERE HEADACHE   Nystatin    Percocet [Oxycodone-Acetaminophen] Nausea And Vomiting   Propoxyphene Nausea And Vomiting   Celebrex [Celecoxib] Other (See Comments)    Other reaction(s): Other flushed   Darvocet [Propoxyphene N-Acetaminophen] Nausea And Vomiting   Erythromycin Nausea And Vomiting    Nausea and vomiting    Hydrocodone Nausea And Vomiting    Lyrica [Pregabalin] Swelling    Legs swelling    Macrodantin [Nitrofurantoin] Nausea And Vomiting   Toradol [Ketorolac Tromethamine] Nausea And Vomiting    Per patient, only PO form causes nausea and vomiting. Can take injection without issue   Tramadol Nausea And Vomiting   Verapamil Nausea And Vomiting    REACTION: intolerance   Medications:  Current Outpatient Medications:    doxycycline (VIBRA-TABS) 100 MG tablet, Take 1 tablet (100 mg total) by mouth 2 (two) times daily., Disp: 20 tablet, Rfl: 0   predniSONE (DELTASONE) 20 MG tablet, Take 2 tablets (40 mg total) by mouth daily with breakfast., Disp: 10 tablet, Rfl: 0   promethazine-dextromethorphan (PROMETHAZINE-DM) 6.25-15 MG/5ML syrup, Take 5 mLs by mouth 4 (four) times daily as needed for cough., Disp: 118 mL, Rfl: 0   amLODipine (NORVASC) 5 MG tablet, Take 1 tablet (5 mg total) by mouth daily. Please make yearly appt with Dr. Lovena Le for February 2023 for future refills. Thank you 1st attempt, Disp: 90 tablet, Rfl: 0   benzonatate (TESSALON) 100 MG capsule, Take 1 capsule (100 mg total) by mouth 3 (three) times daily as needed., Disp: 30 capsule, Rfl: 0   colestipol (COLESTID) 1 g tablet, Take 1 g by mouth 2 (two) times  daily., Disp: , Rfl:    Cyanocobalamin (VITAMIN B-12 IJ), Inject 1 mL as directed every 30 (thirty) days. , Disp: , Rfl:    denosumab (PROLIA) 60 MG/ML SOLN injection, Inject 60 mg into the skin every 6 (six) months. Administer in upper arm, thigh, or abdomen, Disp: , Rfl:    donepezil (ARICEPT) 10 MG tablet, Take 1 tablet (10 mg total) by mouth at bedtime. Must be seen for further refills, call [phone]., Disp: 90 tablet, Rfl: 0   gabapentin (NEURONTIN) 300 MG capsule, TAKE 1 TO 2 CAPSULES BY MOUTH UP TO TWO TIMES DAILY AS NEEDED, Disp: 180 capsule, Rfl: 3   ibuprofen (ADVIL) 600 MG tablet, ibuprofen 600 mg tablet  TAKE 1 TABLET BY MOUTH EVERY 6 HOURS AS NEEDED, Disp: , Rfl:    ipratropium (ATROVENT) 0.03 %  nasal spray, Place 2 sprays into both nostrils every 12 (twelve) hours., Disp: 30 mL, Rfl: 1   leflunomide (ARAVA) 20 MG tablet, TAKE ONE TABLET BY MOUTH DAILY, Disp: 90 tablet, Rfl: 0   losartan (COZAAR) 50 MG tablet, Take 2 tablets (100 mg total) by mouth daily. Take 1 tablet in the morning and 1 tablet in evening as needed for blood pressure greater than 150, Disp: 180 tablet, Rfl: 3   Melatonin 10 MG CAPS, Take 10 capsules by mouth daily., Disp: , Rfl:    memantine (NAMENDA) 10 MG tablet, Take 1 tablet (10 mg total) by mouth 2 (two) times daily., Disp: 180 tablet, Rfl: 4   metoprolol tartrate (LOPRESSOR) 100 MG tablet, TAKE 1 AND 1/2 TABLETS TWICE DAILY, Disp: 45 tablet, Rfl: 0   ondansetron (ZOFRAN-ODT) 8 MG disintegrating tablet, Take 1 tablet by mouth as needed for nausea., Disp: , Rfl:    pantoprazole (PROTONIX) 40 MG tablet, Take 40 mg by mouth 2 (two) times daily., Disp: , Rfl:    polyethylene glycol powder (GLYCOLAX/MIRALAX) 17 GM/SCOOP powder, Take 17 g by mouth daily as needed for mild constipation or moderate constipation. , Disp: , Rfl:    primidone (MYSOLINE) 50 MG tablet, TAKE 1 TABLET AT BEDTIME, Disp: 90 tablet, Rfl: 1   venlafaxine XR (EFFEXOR-XR) 75 MG 24 hr capsule, TAKE 1 CAPSULE EVERY DAY WITH BREAKFAST. Must be seen for further refills, call [phone]., Disp: 90 capsule, Rfl: 0  Observations/Objective: Patient is well-developed, well-nourished in no acute distress.  Resting comfortably at home.  Head is normocephalic, atraumatic.  No labored breathing.  Speech is clear and coherent with logical content.  Patient is alert and oriented at baseline.  Dry cough heard a few times  Assessment and Plan: 1. Acute bacterial bronchitis - doxycycline (VIBRA-TABS) 100 MG tablet; Take 1 tablet (100 mg total) by mouth 2 (two) times daily.  Dispense: 20 tablet; Refill: 0 - promethazine-dextromethorphan (PROMETHAZINE-DM) 6.25-15 MG/5ML syrup; Take 5 mLs by mouth 4 (four) times  daily as needed for cough.  Dispense: 118 mL; Refill: 0 - predniSONE (DELTASONE) 20 MG tablet; Take 2 tablets (40 mg total) by mouth daily with breakfast.  Dispense: 10 tablet; Refill: 0  2. Acute bacterial sinusitis - doxycycline (VIBRA-TABS) 100 MG tablet; Take 1 tablet (100 mg total) by mouth 2 (two) times daily.  Dispense: 20 tablet; Refill: 0 - predniSONE (DELTASONE) 20 MG tablet; Take 2 tablets (40 mg total) by mouth daily with breakfast.  Dispense: 10 tablet; Refill: 0  - Suspect recurrent bacterial bronchitis and sinusitis - Will treat with Doxycycline - Add Promethazine DM for cough - May continue tessalon perles she  has at home already - Add prednisone for inflammation since recurrent - Push fluids - Rest - Seek in person evaluation if not improving or worsening  Follow Up Instructions: I discussed the assessment and treatment plan with the patient. The patient was provided an opportunity to ask questions and all were answered. The patient agreed with the plan and demonstrated an understanding of the instructions.  A copy of instructions were sent to the patient via MyChart unless otherwise noted below.    The patient was advised to call back or seek an in-person evaluation if the symptoms worsen or if the condition fails to improve as anticipated.  Time:  I spent 11 minutes with the patient via telehealth technology discussing the above problems/concerns.    Mar Daring, PA-C

## 2021-10-13 NOTE — Patient Instructions (Signed)
Mia [name], thank you for joining Mar Daring, PA-C for today's virtual visit.  While this provider is not your primary care provider (PCP), if your PCP is located in our provider database this encounter information will be shared with them immediately following your visit.  Consent: (Patient) Mia Friedlander Satterwhite provided verbal consent for this virtual visit at the beginning of the encounter.  Current Medications:  Current Outpatient Medications:    doxycycline (VIBRA-TABS) 100 MG tablet, Take 1 tablet (100 mg total) by mouth 2 (two) times daily., Disp: 20 tablet, Rfl: 0   predniSONE (DELTASONE) 20 MG tablet, Take 2 tablets (40 mg total) by mouth daily with breakfast., Disp: 10 tablet, Rfl: 0   promethazine-dextromethorphan (PROMETHAZINE-DM) 6.25-15 MG/5ML syrup, Take 5 mLs by mouth 4 (four) times daily as needed for cough., Disp: 118 mL, Rfl: 0   amLODipine (NORVASC) 5 MG tablet, Take 1 tablet (5 mg total) by mouth daily. Please make yearly appt with Dr. Lovena Le for February 2023 for future refills. Thank you 1st attempt, Disp: 90 tablet, Rfl: 0   benzonatate (TESSALON) 100 MG capsule, Take 1 capsule (100 mg total) by mouth 3 (three) times daily as needed., Disp: 30 capsule, Rfl: 0   colestipol (COLESTID) 1 g tablet, Take 1 g by mouth 2 (two) times daily., Disp: , Rfl:    Cyanocobalamin (VITAMIN B-12 IJ), Inject 1 mL as directed every 30 (thirty) days. , Disp: , Rfl:    denosumab (PROLIA) 60 MG/ML SOLN injection, Inject 60 mg into the skin every 6 (six) months. Administer in upper arm, thigh, or abdomen, Disp: , Rfl:    donepezil (ARICEPT) 10 MG tablet, Take 1 tablet (10 mg total) by mouth at bedtime. Must be seen for further refills, call [phone]., Disp: 90 tablet, Rfl: 0   gabapentin (NEURONTIN) 300 MG capsule, TAKE 1 TO 2 CAPSULES BY MOUTH UP TO TWO TIMES DAILY AS NEEDED, Disp: 180 capsule, Rfl: 3   ibuprofen (ADVIL) 600 MG tablet, ibuprofen 600 mg tablet  TAKE 1 TABLET BY MOUTH EVERY  6 HOURS AS NEEDED, Disp: , Rfl:    ipratropium (ATROVENT) 0.03 % nasal spray, Place 2 sprays into both nostrils every 12 (twelve) hours., Disp: 30 mL, Rfl: 1   leflunomide (ARAVA) 20 MG tablet, TAKE ONE TABLET BY MOUTH DAILY, Disp: 90 tablet, Rfl: 0   losartan (COZAAR) 50 MG tablet, Take 2 tablets (100 mg total) by mouth daily. Take 1 tablet in the morning and 1 tablet in evening as needed for blood pressure greater than 150, Disp: 180 tablet, Rfl: 3   Melatonin 10 MG CAPS, Take 10 capsules by mouth daily., Disp: , Rfl:    memantine (NAMENDA) 10 MG tablet, Take 1 tablet (10 mg total) by mouth 2 (two) times daily., Disp: 180 tablet, Rfl: 4   metoprolol tartrate (LOPRESSOR) 100 MG tablet, TAKE 1 AND 1/2 TABLETS TWICE DAILY, Disp: 45 tablet, Rfl: 0   ondansetron (ZOFRAN-ODT) 8 MG disintegrating tablet, Take 1 tablet by mouth as needed for nausea., Disp: , Rfl:    pantoprazole (PROTONIX) 40 MG tablet, Take 40 mg by mouth 2 (two) times daily., Disp: , Rfl:    polyethylene glycol powder (GLYCOLAX/MIRALAX) 17 GM/SCOOP powder, Take 17 g by mouth daily as needed for mild constipation or moderate constipation. , Disp: , Rfl:    primidone (MYSOLINE) 50 MG tablet, TAKE 1 TABLET AT BEDTIME, Disp: 90 tablet, Rfl: 1   venlafaxine XR (EFFEXOR-XR) 75 MG 24 hr capsule, TAKE  1 CAPSULE EVERY DAY WITH BREAKFAST. Must be seen for further refills, call [phone]., Disp: 90 capsule, Rfl: 0   Medications ordered in this encounter:  Meds ordered this encounter  Medications   doxycycline (VIBRA-TABS) 100 MG tablet    Sig: Take 1 tablet (100 mg total) by mouth 2 (two) times daily.    Dispense:  20 tablet    Refill:  0    Order Specific Question:   Supervising Provider    Answer:   Noemi Chapel [[id number]]   promethazine-dextromethorphan (PROMETHAZINE-DM) 6.25-15 MG/5ML syrup    Sig: Take 5 mLs by mouth 4 (four) times daily as needed for cough.    Dispense:  118 mL    Refill:  0    Order Specific Question:   Supervising  Provider    Answer:   MILLER, BRIAN [[id number]]   predniSONE (DELTASONE) 20 MG tablet    Sig: Take 2 tablets (40 mg total) by mouth daily with breakfast.    Dispense:  10 tablet    Refill:  0    Order Specific Question:   Supervising Provider    Answer:   Sabra Heck, BRIAN [[id number]]     *If you need refills on other medications prior to your next appointment, please contact your pharmacy*  Follow-Up: Call back or seek an in-person evaluation if the symptoms worsen or if the condition fails to improve as anticipated.  Other Instructions Acute Bronchitis, Adult Acute bronchitis is when air tubes in the lungs (bronchi) suddenly get swollen. The condition can make it hard for you to breathe. In adults, acute bronchitis usually goes away within 2 weeks. A cough caused by bronchitis may last up to 3 weeks. Smoking, allergies, and asthma can make the condition worse. What are the causes? Germs that cause cold and flu (viruses). The most common cause of this condition is the virus that causes the common cold. Bacteria. Substances that bother (irritate) the lungs, including: Smoke from cigarettes and other types of tobacco. Dust and pollen. Fumes from chemicals, gases, or burned fuel. Indoor or outdoor air pollution. What increases the risk? A weak body's defense system. This is also called the immune system. Any condition that affects your lungs and breathing, such as asthma. What are the signs or symptoms? A cough. Coughing up clear, yellow, or green mucus. Making high-pitched whistling sounds when you breathe, most often when you breathe out (wheezing). Runny or stuffy nose. Having too much mucus in your lungs (chest congestion). Shortness of breath. Body aches. A sore throat. How is this treated? Acute bronchitis may go away over time without treatment. Your doctor may tell you to: Drink more fluids. This will help thin your mucus so it is easier to cough up. Use a device that gets medicine  into your lungs (inhaler). Use a vaporizer or a humidifier. These are machines that add water to the air. This helps with coughing and poor breathing. Take a medicine that thins mucus and helps clear it from your lungs. Take a medicine that prevents or stops coughing. It is not common to take an antibiotic medicine for this condition. Follow these instructions at home:  Take over-the-counter and prescription medicines only as told by your doctor. Use an inhaler, vaporizer, or humidifier as told by your doctor. Take two teaspoons (10 mL) of honey at bedtime. This helps lessen your coughing at night. Drink enough fluid to keep your pee (urine) pale yellow. Do not smoke or use any products that contain nicotine or  tobacco. If you need help quitting, ask your doctor. Get a lot of rest. Return to your normal activities when your doctor says that it is safe. Keep all follow-up visits. How is this prevented?  Wash your hands often with soap and water for at least 20 seconds. If you cannot use soap and water, use hand sanitizer. Avoid contact with people who have cold symptoms. Try not to touch your mouth, nose, or eyes with your hands. Avoid breathing in smoke or chemical fumes. Make sure to get the flu shot every year. Contact a doctor if: Your symptoms do not get better in 2 weeks. You have trouble coughing up the mucus. Your cough keeps you awake at night. You have a fever. Get help right away if: You cough up blood. You have chest pain. You have very bad shortness of breath. You faint or keep feeling like you are going to faint. You have a very bad headache. Your fever or chills get worse. These symptoms may be an emergency. Get help right away. Call your local emergency services (911 in the [place] Do not wait to see if the symptoms will go away. Do not drive yourself to the hospital. Summary Acute bronchitis is when air tubes in the lungs (bronchi) suddenly get swollen. In adults,  acute bronchitis usually goes away within 2 weeks. Drink more fluids. This will help thin your mucus so it is easier to cough up. Take over-the-counter and prescription medicines only as told by your doctor. Contact a doctor if your symptoms do not improve after 2 weeks of treatment. This information is not intended to replace advice given to you by your health care provider. Make sure you discuss any questions you have with your health care provider. Document Revised: 02/12/2021 Document Reviewed: 02/12/2021 Elsevier Patient Education  [place].   Sinusitis, Adult Sinusitis is soreness and swelling (inflammation) of your sinuses. Sinuses are hollow spaces in the bones around your face. They are located: Around your eyes. In the middle of your forehead. Behind your nose. In your cheekbones. Your sinuses and nasal passages are lined with a fluid called mucus. Mucus drains out of your sinuses. Swelling can trap mucus in your sinuses. This lets germs (bacteria, virus, or fungus) grow, which leads to infection. Most of the time, this condition is caused by a virus. What are the causes? This condition is caused by: Allergies. Asthma. Germs. Things that block your nose or sinuses. Growths in the nose (nasal polyps). Chemicals or irritants in the air. Fungus (rare). What increases the risk? You are more likely to develop this condition if: You have a weak body defense system (immune system). You do a lot of swimming or diving. You use nasal sprays too much. You smoke. What are the signs or symptoms? The main symptoms of this condition are pain and a feeling of pressure around the sinuses. Other symptoms include: Stuffy nose (congestion). Runny nose (drainage). Swelling and warmth in the sinuses. Headache. Toothache. A cough that may get worse at night. Mucus that collects in the throat or the back of the nose (postnasal drip). Being unable to smell and taste. Being very  tired (fatigue). A fever. Sore throat. Bad breath. How is this diagnosed? This condition is diagnosed based on: Your symptoms. Your medical history. A physical exam. Tests to find out if your condition is short-term (acute) or long-term (chronic). Your doctor may: Check your nose for growths (polyps). Check your sinuses using a tool that has  a light (endoscope). Check for allergies or germs. Do imaging tests, such as an MRI or CT scan. How is this treated? Treatment for this condition depends on the cause and whether it is short-term or long-term. If caused by a virus, your symptoms should go away on their own within 10 days. You may be given medicines to relieve symptoms. They include: Medicines that shrink swollen tissue in the nose. Medicines that treat allergies (antihistamines). A spray that treats swelling of the nostrils.  Rinses that help get rid of thick mucus in your nose (nasal saline washes). If caused by bacteria, your doctor may wait to see if you will get better without treatment. You may be given antibiotic medicine if you have: A very bad infection. A weak body defense system. If caused by growths in the nose, you may need to have surgery. Follow these instructions at home: Medicines Take, use, or apply over-the-counter and prescription medicines only as told by your doctor. These may include nasal sprays. If you were prescribed an antibiotic medicine, take it as told by your doctor. Do not stop taking the antibiotic even if you start to feel better. Hydrate and humidify  Drink enough water to keep your pee (urine) pale yellow. Use a cool mist humidifier to keep the humidity level in your home above 50%. Breathe in steam for 10-15 minutes, 3-4 times a day, or as told by your doctor. You can do this in the bathroom while a hot shower is running. Try not to spend time in cool or dry air. Rest Rest as much as you can. Sleep with your head raised (elevated). Make  sure you get enough sleep each night. General instructions  Put a warm, moist washcloth on your face 3-4 times a day, or as often as told by your doctor. This will help with discomfort. Wash your hands often with soap and water. If there is no soap and water, use hand sanitizer. Do not smoke. Avoid being around people who are smoking (secondhand smoke). Keep all follow-up visits as told by your doctor. This is important. Contact a doctor if: You have a fever. Your symptoms get worse. Your symptoms do not get better within 10 days. Get help right away if: You have a very bad headache. You cannot stop throwing up (vomiting). You have very bad pain or swelling around your face or eyes. You have trouble seeing. You feel confused. Your neck is stiff. You have trouble breathing. Summary Sinusitis is swelling of your sinuses. Sinuses are hollow spaces in the bones around your face. This condition is caused by tissues in your nose that become inflamed or swollen. This traps germs. These can lead to infection. If you were prescribed an antibiotic medicine, take it as told by your doctor. Do not stop taking it even if you start to feel better. Keep all follow-up visits as told by your doctor. This is important. This information is not intended to replace advice given to you by your health care provider. Make sure you discuss any questions you have with your health care provider. Document Revised: 03/14/2018 Document Reviewed: 03/14/2018 Elsevier Patient Education  2022 [employer].    If you have been instructed to have an in-person evaluation today at a local Urgent Care facility, please use the link below. It will take you to a list of all of our available [facility], including address, phone number and hours of operation. Please do not delay care.  [facility]  If you or a family member do not have a primary care provider, use the link below to schedule a visit  and establish care. When you choose a [facility] primary care physician or advanced practice provider, you gain a long-term partner in health. Find a Primary Care Provider  Learn more about [facility]'s in-office and virtual care options: [facility]

## 2021-10-14 ENCOUNTER — Other Ambulatory Visit: Payer: Self-pay

## 2021-10-14 ENCOUNTER — Ambulatory Visit (INDEPENDENT_AMBULATORY_CARE_PROVIDER_SITE_OTHER): Payer: Medicare Other | Admitting: Pharmacist

## 2021-10-14 DIAGNOSIS — R7301 Impaired fasting glucose: Secondary | ICD-10-CM

## 2021-10-14 DIAGNOSIS — I10 Essential (primary) hypertension: Secondary | ICD-10-CM

## 2021-10-14 DIAGNOSIS — M818 Other osteoporosis without current pathological fracture: Secondary | ICD-10-CM

## 2021-10-14 NOTE — Progress Notes (Signed)
Chronic Care Management Pharmacy Note  10/14/2021 Name:  Mia [name] MRN:  [id number] DOB:  1951/05/17  Summary: addressed HTN, impaired fasting glucose, and osteoporosis. Patient is working on recovering from bronchitis. Otherwise doing well & preparing for surgery in January for her back.   Recommendations/Changes made from today's visit: Answered patient questions about OTC medications and symptomatic relief, as well as proper antibiotic use. No changes at this time.   Plan: f/u with pharmacist in 6 months for adherence  Subjective: Mia [name] is an 70 y.o. year old female who is a primary patient of Metheney, Rene Kocher, MD.  The CCM team was consulted for assistance with disease management and care coordination needs.    Engaged with patient by telephone for follow up visit in response to provider referral for pharmacy case management and/or care coordination services.   Consent to Services:  The patient was given information about Chronic Care Management services, agreed to services, and gave verbal consent prior to initiation of services.  Please see initial visit note for detailed documentation.   Patient Care Team: Hali Marry, MD as PCP - General (Family Medicine) Nahser, Wonda Cheng, MD as PCP - Cardiology (Cardiology) Silverio Decamp, MD as Consulting Physician (Sports Medicine) Bo Merino, MD as Consulting Physician (Rheumatology) Carolan Clines, MD (Inactive) as Consulting Physician (Urology) Collene Gobble, MD as Consulting Physician (Pulmonary Disease) Richmond Campbell, MD as Consulting Physician (Gastroenterology) Zadie Rhine Clent Demark, MD as Consulting Physician (Ophthalmology) Dr. [name] as Referring Physician (Neurology) Daubert, Marlise Eves, MD as Consulting Physician (Orthopedic Surgery) Eusebio Friendly, MD as Referring Physician (Internal Medicine) Darius Bump, [facility] as Pharmacist (Pharmacist) Otelia Sergeant, OD as Referring  Physician (Optometry)  Recent office visits:  06/26/21 Morrie Sheldon MD - Seen for Pernicious anemia - Vitamin B12 injection given - Follow up in 4 weeks  05/29/21 Joanette Gula MD - Seen for B12 Deficiency - Vitamin B12 injection given - Follow up in 4 weeks 05/01/21 Seen for B12 Deficiency - Vitamin B12 injection given - Follow up in 3 weeks 04/08/21 [name] PA - Seen for B12 Deficiency - Vitamin B12 injection given - Follow up in 3 weeks 03/18/21 Morrie Sheldon MD - Seen for B12 Deficiency - Vitamin B12 injection and prolia injection given - Stopped metformin - Labs ordered - Referral to dermatology - Follow up in 3 weeks  02/26/21 [name] - Seen for medicare annual wellness visit - No medication changes noted - Follow up in 1 year  02/12/21 Morrie Sheldon MD - Seen for osteoporosis - Labs ordered - Vitamin B12 injection given - Follow up in 1 month  01/15/21 Hali Marry MD - Seen for Pernicious anemia - Vitamin B12 injection given - Follow up in 1 month        Recent consult visits:  07/02/21 [name] PA - Rheumatology - Seen for Seronegative rheumatoid arthritis - No medication changes noted -  Follow up in 5 months  06/25/21 [name] PA - [facility] - Seen for Greater tronchanteric bursitis - Started predniSONE 10 MG tablet  XHB:ZJIR 6 tablets (60 mg) by mouth in the morning for 2 days, THEN 5 tablets (50 mg) in the morning for 2 days, THEN 4 tablets (40 mg) in the morning for 2 days, THEN 3 tablets (30 mg) in the morning for 2 days, THEN 2 tablets (20 mg) in the morning for 2 days, THEN 1 tablet (  10 mg) in the morning for 2 days - X rays ordered - corticosteroid injection given - No follow up noted  06/18/21 [name] MD - [place] for acute pain of left shoulder - No medication changes noted - Follow up in 4-6 weeks  06/05/21 Eusebio Friendly MD - [facility] - Seen for gastroesophageal reflux - Stop Amlodipine  Besylate 5 Mg Tablet, and Primidone (Mysoline) 50 Mg Tablet. Start pantoprazole, Lomotil, and ferrous sulfate 325 mg - Follow up in 6 months 05/23/21 [name] MD - [name] - Seen for Surgery aftercare - No medication changes - Follow up in 1 month 05/19/21 - Steffanie Rainwater Physical therapist - [facility] - Seen for closed fracture of fifth metatarsal bone - No notes available  05/01/21 [name] PA - [facility] - Seen for aftercare following surgery - No medication changes - No follow up noted  04/22/21 [name] PA - [facility] - Seen for aftercare following surgery - Started tiZANidine (Zanaflex) 4 MG capsule, ULA:GTXM 1 capsule (4 mg) by mouth if needed at bedtime for muscle spasms - follow up in 3- 4 weeks  04/15/21 [name] - Sports medicine - Seen for shoulder arthroscopy - Notes not available  04/14/21 [name] - Orthopedic surgery - Seen for pain in right foot - Notes not available  04/07/21 [name] - Dermatology - Seen for carcinoma in situ of skin - No notes available  03/31/21 [name] PA - Seen for pain in left foot - No notes available  03/28/21 Eusebio Friendly - Gastroenterology - Seen for immunization - No notes available  03/25/21 [name] PA  - Seen for unspecified nonsuppurative otitis media - No notes available  03/20/21 Eusebio Friendly - Gastroenterology - Seen for iron deficiency - No notes available  03/19/21 [name] MD - [name] - Seen for chronic left shoulder pain - No medication changes noted - No follow up noted  03/13/21 [name] MD - [name] - Seen for pain in left shoulder - No notes available  03/06/21 Eusebio Friendly - Radiology department - Seen for elevated liver function tests - Injections given fentaNYL citrate 25 mcg and 50 mcg, lidocaine 2%, ondansetron 4 mg - Given 1 dose of LORazepam 1 mg tablet while in office - Follow up not noted  03/04/21 Buchholz Givens NP - Neurology - Seen for  fibromyalgia - No medication changes - Follow up in 6  months  03/03/21 [name] MD - [name] - Seen for Left shoulder pain - no notes available  02/25/21 Eusebio Friendly - Gastroenterology - Immunization encounter - No notes available  02/13/21 Nathaniel Man MD - [name] - Seen for cervical spine pain - No medication changes - Follow up in 6 months  02/07/21 Luanna Cole - [facility] - Seen for pain in right foot - No notes available  02/06/21 Theadora Rama B Daubert - Seen for Radiculopathy - No notes available  02/05/21 Eusebio Friendly - Radiology department - Seen for elevated liver function tests - No medication changes noted - No follow up noted  01/29/21 Bo Merino MD - Rheumatology - Seen for seronegative rheumatoid arthritis - No medication changes - Follow up in 5 months  01/29/21 Eusebio Friendly - Gastroenterology - Start Lomotil once a day - changes pantoprazole from 40 mg to 20 mg, 2 times daily. Modified colestipol to 1 g tablet 2 times daily - Follow up in 4 months  01/23/21 Dylan Plocki PA - [facility] - Seen  for pain in right foot - No notes available          Hospital visits:  None in previous 6 months  Objective:  Lab Results  Component Value Date   CREATININE 0.86 09/09/2021   CREATININE 0.81 08/01/2021   CREATININE 0.71 06/10/2021    Lab Results  Component Value Date   HGBA1C 5.8 (A) 07/22/2021   Last diabetic Eye exam:  Lab Results  Component Value Date/Time   HMDIABEYEEXA No Retinopathy 06/24/2021 12:00 AM    Last diabetic Foot exam: No results found for: HMDIABFOOTEX      Component Value Date/Time   CHOL 215 (H) 08/01/2021 0000   CHOL 128 06/07/2019 0934   TRIG 125 08/01/2021 0000   HDL 68 08/01/2021 0000   HDL 52 06/07/2019 0934   CHOLHDL 3.2 08/01/2021 0000   VLDL 22 08/28/2016 1558   LDLCALC 123 (H) 08/01/2021 0000    Hepatic Function Latest Ref Rng & Units 09/09/2021 08/01/2021 06/10/2021  Total Protein 6.1 - 8.1 g/dL 7.0 6.9 6.4   Albumin 3.5 - 5.2 g/dL - - -  AST 10 - 35 U/L 29 24 34  ALT 6 - 29 U/L 28 25 31(H)  Alk Phosphatase 39 - 117 U/L - - -  Total Bilirubin 0.2 - 1.2 mg/dL 0.4 0.4 0.4  Bilirubin, Direct 0.0 - 0.2 mg/dL - - -    Lab Results  Component Value Date/Time   TSH 1.34 05/21/2020 08:19 AM   TSH 1.43 09/12/2019 11:44 AM   FREET4 0.96 08/31/2010 01:08 AM    CBC Latest Ref Rng & Units 09/09/2021 08/01/2021 06/10/2021  WBC 3.8 - 10.8 Thousand/uL 5.7 5.4 5.1  Hemoglobin 11.7 - 15.5 g/dL 14.4 14.2 12.1  Hematocrit 35.0 - 45.0 % 43.0 43.5 39.4  Platelets 140 - 400 Thousand/uL 405(H) 416(H) 334    Lab Results  Component Value Date/Time   VD25OH 76 09/09/2015 02:57 PM    Social History   Tobacco Use  Smoking Status Never  Smokeless Tobacco Never   BP Readings from Last 3 Encounters:  09/25/21 108/63  08/20/21 140/80  07/22/21 (!) 130/57   Pulse Readings from Last 3 Encounters:  09/25/21 70  08/20/21 67  07/22/21 (!) 51   Wt Readings from Last 3 Encounters:  08/20/21 171 lb 0.6 oz (77.6 kg)  07/22/21 170 lb (77.1 kg)  07/02/21 172 lb (78 kg)    Assessment: Review of patient past medical history, allergies, medications, health status, including review of consultants reports, laboratory and other test data, was performed as part of comprehensive evaluation and provision of chronic care management services.   SDOH:  (Social Determinants of Health) assessments and interventions performed:    CCM Care Plan  Allergies  Allergen Reactions   Codeine Nausea And Vomiting   Myrbetriq  [Mirabegron] Other (See Comments)    SEVERE HEADACHE   Nystatin    Percocet [Oxycodone-Acetaminophen] Nausea And Vomiting   Propoxyphene Nausea And Vomiting   Celebrex [Celecoxib] Other (See Comments)    Other reaction(s): Other flushed   Darvocet [Propoxyphene N-Acetaminophen] Nausea And Vomiting   Erythromycin Nausea And Vomiting    Nausea and vomiting    Hydrocodone Nausea And Vomiting   Lyrica  [Pregabalin] Swelling    Legs swelling    Macrodantin [Nitrofurantoin] Nausea And Vomiting   Toradol [Ketorolac Tromethamine] Nausea And Vomiting    Per patient, only PO form causes nausea and vomiting. Can take injection without issue   Tramadol Nausea And Vomiting  Verapamil Nausea And Vomiting    REACTION: intolerance    Medications Reviewed Today     Reviewed by Rubye Beach (Physician Assistant Certified) on 77/82/42 at [place] List Status: <None>   Medication Order Taking? Sig Documenting Provider Last Dose Status Informant  amLODipine (NORVASC) 5 MG tablet [id number]  Take 1 tablet (5 mg total) by mouth daily. Please make yearly appt with Dr. Lovena Le for February 2023 for future refills. Thank you 1st attempt Evans Lance, MD  Active   benzonatate (TESSALON) 100 MG capsule [id number] No Take 1 capsule (100 mg total) by mouth 3 (three) times daily as needed. Fenton Malling M, PA-C Taking Active   colestipol (COLESTID) 1 g tablet [id number] No Take 1 g by mouth 2 (two) times daily. [provider] Taking Active   Cyanocobalamin (VITAMIN B-12 IJ) [id number] No Inject 1 mL as directed every 30 (thirty) days.  [provider] Taking Active Self  denosumab (PROLIA) 60 MG/ML SOLN injection [id number] No Inject 60 mg into the skin every 6 (six) months. Administer in upper arm, thigh, or abdomen [provider] Taking Active Self  donepezil (ARICEPT) 10 MG tablet [id number] No Take 1 tablet (10 mg total) by mouth at bedtime. Must be seen for further refills, call [phone]. Myers Givens, NP Taking Active   doxycycline (VIBRA-TABS) 100 MG tablet [id number] Yes Take 1 tablet (100 mg total) by mouth 2 (two) times daily. Fenton Malling M, PA-C  Active   gabapentin (NEURONTIN) 300 MG capsule [id number] No TAKE 1 TO 2 CAPSULES BY MOUTH UP TO TWO TIMES DAILY AS NEEDED Hali Marry, MD Taking Active Self  ibuprofen (ADVIL) 600 MG tablet  [id number] No ibuprofen 600 mg tablet  TAKE 1 TABLET BY MOUTH EVERY 6 HOURS AS NEEDED [provider] Taking Active   ipratropium (ATROVENT) 0.03 % nasal spray [id number] No Place 2 sprays into both nostrils every 12 (twelve) hours. [name], PA-C Taking Active   leflunomide (ARAVA) 20 MG tablet [id number] No TAKE ONE TABLET BY MOUTH DAILY [name], PA-C Taking Active   losartan (COZAAR) 50 MG tablet [id number] No Take 2 tablets (100 mg total) by mouth daily. Take 1 tablet in the morning and 1 tablet in evening as needed for blood pressure greater than 150 Evans Lance, MD Taking Active   Melatonin 10 MG CAPS [id number] No Take 10 capsules by mouth daily. [provider] Taking Active Self  memantine (NAMENDA) 10 MG tablet [id number] No Take 1 tablet (10 mg total) by mouth 2 (two) times daily. Kathrynn Ducking, MD Taking Active   metoprolol tartrate (LOPRESSOR) 100 MG tablet [id number] No TAKE 1 AND 1/2 TABLETS TWICE DAILY Nahser, Wonda Cheng, MD Taking Active   ondansetron (ZOFRAN-ODT) 8 MG disintegrating tablet [id number] No Take 1 tablet by mouth as needed for nausea. [provider] Taking Active   pantoprazole (PROTONIX) 40 MG tablet [id number] No Take 40 mg by mouth 2 (two) times daily. [provider] Taking Active Self  polyethylene glycol powder (GLYCOLAX/MIRALAX) 17 GM/SCOOP powder [id number] No Take 17 g by mouth daily as needed for mild constipation or moderate constipation.  [provider] Taking Active Self  predniSONE (DELTASONE) 20 MG tablet [id number] Yes Take 2 tablets (40 mg total) by mouth daily with breakfast. Mar Daring, PA-C  Active   primidone (MYSOLINE) 50 MG tablet [id number] No TAKE 1 TABLET AT BEDTIME Hali Marry, MD Taking Active   promethazine-dextromethorphan (  PROMETHAZINE-DM) 6.25-15 MG/5ML syrup [id number] Yes Take 5 mLs by mouth 4 (four) times daily as needed for cough. Mar Daring, [place]   Active   venlafaxine XR (EFFEXOR-XR) 75 MG 24 hr capsule [id number] No TAKE 1 CAPSULE EVERY DAY WITH BREAKFAST. Must be seen for further refills, call [phone]. 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   Weakness of both lower extremities 06/11/2020   Hyponatremia 05/22/2020   Gait instability 06/20/2019   Facet arthritis of lumbar region 05/10/2018   MCI (mild cognitive impairment) 11/25/2017   Restrictive lung disease 07/30/2017   Mixed hyperlipidemia 06/18/2017   Rectocele 04/26/2017   Irritable bowel syndrome with both constipation and diarrhea 04/26/2017   Osteoporosis 04/18/2017   Trochanteric bursitis of both hips 02/24/2017   Stress fracture of left tibia 11/05/2016   Inflammatory arthritis 09/30/2016   High risk medication use 09/30/2016   History of Clostridium difficile colitis 09/30/2016   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   Pseudogout 09/30/2016   DDD cervical spine status post fusion 09/30/2016   DDD thoracic spine 09/30/2016   H/O total knee replacement, right 09/30/2016   Age-related osteoporosis without current pathological fracture 09/30/2016   Tremor, essential 05/14/2016   Right foot pain 04/14/2016   B12 deficiency 09/10/2015   Syncope 09/10/2015   Cervical facet joint syndrome 09/10/2015   Chronic fatigue 09/10/2015   Aortic atherosclerosis ([place]) 04/01/2015   DOE (dyspnea on exertion)    Primary osteoarthritis of right knee 08/13/2014   Benign head tremor 06/11/2014   Lumbar degenerative disc disease 06/11/2014   IFG (impaired fasting glucose) 03/09/2014   Hemorrhoid 03/09/2014   Retinal wrinkling, right eye 03/09/2014   Fibromyalgia 03/09/2014   GERD (gastroesophageal reflux disease) 03/09/2014   History of arthroplasty of right knee 01/31/2014   Memory  difficulty 12/14/2013   Hemorrhoids, external, thrombosed 06/22/2011   Hypertension 03/11/2011   SVT (supraventricular tachycardia) ([place])    Raynaud phenomenon    EDEMA 08/25/2010   Nonspecific (abnormal) findings on radiological and other examination of body structure 08/25/2010   COMPUTERIZED TOMOGRAPHY, CHEST, ABNORMAL 08/25/2010   OSA (obstructive sleep apnea) 04/24/2009   Allergic rhinitis 06/11/2008   Dyspnea 06/11/2008   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   Chronic diastolic CHF (congestive heart failure) ([place]) 06/08/2008   INTERSTITIAL CYSTITIS 06/08/2008    Immunization History  Administered Date(s) Administered   Fluad Quad(high Dose 65+) 07/13/2019, 07/10/2020, 07/22/2021   Hepatitis B, adult 02/25/2021, 03/28/2021, 08/26/2021   Influenza Split 07/26/2012, 06/11/2014, 07/25/2015, 06/23/2018   Influenza Whole 07/30/2009, 08/19/2010   Influenza, High Dose Seasonal PF 07/06/2016, 07/05/2017   Influenza,inj,Quad PF,6+ Mos 06/11/2014, 07/25/2015, 06/23/2018   Influenza-Unspecified 07/26/2013, 09/05/2013, 07/13/2019, 07/10/2020, 07/22/2021   Moderna Sars-Covid-2 Vaccination 02/29/2020, 03/28/2020, 10/04/2020   Pneumococcal Conjugate-13 01/04/2017   Pneumococcal Polysaccharide-23 07/30/2009, 11/14/2018   Pneumococcal-Unspecified 10/27/2015   Tdap 02/13/2008, 05/12/2018   Zoster, Live 09/01/2011    Conditions to be addressed/monitored: HTN and preDM, osteoporosis  There are no care plans that you recently modified to display for this patient.    Medication Assistance: None required.  Patient affirms current coverage meets needs.  Patient's preferred pharmacy is:  [id number] [place], [place] - [address]  [place] Phone: [phone] Fax: [phone]  [facility], [place]  [address] Phone: [phone] Fax: [phone] PHARMACY [id number], [place] [address] Phone: [phone] Fax: [phone]   Uses pill box? Yes Pt endorses 60% compliance  Follow Up:  Patient agrees to Care Plan and Follow-up.  Plan: Telephone follow up appointment with care management team member scheduled for:  6 months  Larinda Buttery, PharmD Clinical Pharmacist [facility] [phone]

## 2021-10-14 NOTE — Patient Instructions (Signed)
Visit Information  Thank you for taking time to visit with me today. Please don't hesitate to contact me if I can be of assistance to you before our next scheduled telephone appointment.  Following are the goals we discussed today:   Patient Goals/Self-Care Activities Over the next 180 days, patient will:  take medications as prescribed and focus on medication adherence by setting an alarm for 9am and 9pm as we discussed  Follow Up Plan: Telephone follow up appointment with care management team member scheduled for:  6 months   Please call the care guide team at [phone] if you need to cancel or reschedule your appointment.   Patient verbalizes understanding of instructions provided today and agrees to view in [place].   Larinda Buttery, PharmD Clinical Pharmacist [facility] [phone]

## 2021-10-17 DIAGNOSIS — R059 Cough, unspecified: Secondary | ICD-10-CM | POA: Diagnosis not present

## 2021-10-17 DIAGNOSIS — R062 Wheezing: Secondary | ICD-10-CM | POA: Diagnosis not present

## 2021-10-17 DIAGNOSIS — R0981 Nasal congestion: Secondary | ICD-10-CM | POA: Diagnosis not present

## 2021-10-24 ENCOUNTER — Ambulatory Visit (INDEPENDENT_AMBULATORY_CARE_PROVIDER_SITE_OTHER): Payer: Medicare Other | Admitting: Physician Assistant

## 2021-10-24 ENCOUNTER — Other Ambulatory Visit: Payer: Self-pay

## 2021-10-24 VITALS — BP 106/47 | HR 63

## 2021-10-24 DIAGNOSIS — E538 Deficiency of other specified B group vitamins: Secondary | ICD-10-CM | POA: Diagnosis not present

## 2021-10-24 MED ORDER — CYANOCOBALAMIN 1000 MCG/ML IJ SOLN
1000.0000 ug | Freq: Once | INTRAMUSCULAR | Status: AC
Start: 2021-10-24 — End: 2021-10-24
  Administered 2021-10-24: 10:00:00 1000 ug via INTRAMUSCULAR

## 2021-10-24 NOTE — Progress Notes (Signed)
Agree with above plan. 

## 2021-10-24 NOTE — Progress Notes (Signed)
Pt is here for a vitamin B12 injection.  Pt denies gastrointestinal problems or dizziness.  Injection administered in [place].  Pt tolerated injection well.   Pt states that she feels good, however her diastolic BP is low today in the office.  Pt states that she has not had much to drink this morning other than coffee.  Advised pt to drink lots of water today.  Pt expressed understanding and is agreeable.  Charyl Bigger, CMA

## 2021-10-26 DIAGNOSIS — Z20822 Contact with and (suspected) exposure to covid-19: Secondary | ICD-10-CM | POA: Diagnosis not present

## 2021-11-02 ENCOUNTER — Other Ambulatory Visit: Payer: Self-pay | Admitting: Adult Health

## 2021-11-05 ENCOUNTER — Ambulatory Visit: Payer: Medicare Other | Admitting: Adult Health

## 2021-11-05 ENCOUNTER — Other Ambulatory Visit: Payer: Self-pay | Admitting: Physician Assistant

## 2021-11-06 NOTE — Telephone Encounter (Signed)
Next Visit: 12/04/2021   Last Visit: 07/02/2021   Last Fill: 07/29/2021   DX: Seronegative rheumatoid arthritis    Current Dose per office note 07/02/2021:  Arava 20 mg 1 tablet by mouth daily   Labs: 09/09/2021 Sodium is mildly decreased and is stable.  Platelets are mildly elevated.  Okay to refill Arava?

## 2021-11-13 ENCOUNTER — Encounter: Payer: Self-pay | Admitting: Adult Health

## 2021-11-13 ENCOUNTER — Other Ambulatory Visit: Payer: Self-pay

## 2021-11-13 ENCOUNTER — Ambulatory Visit (INDEPENDENT_AMBULATORY_CARE_PROVIDER_SITE_OTHER): Payer: Medicare Other | Admitting: Adult Health

## 2021-11-13 VITALS — BP 132/81 | HR 58 | Ht 66.0 in | Wt 174.4 lb

## 2021-11-13 DIAGNOSIS — R413 Other amnesia: Secondary | ICD-10-CM

## 2021-11-13 DIAGNOSIS — M797 Fibromyalgia: Secondary | ICD-10-CM | POA: Diagnosis not present

## 2021-11-13 DIAGNOSIS — G250 Essential tremor: Secondary | ICD-10-CM

## 2021-11-13 MED ORDER — VENLAFAXINE HCL ER 75 MG PO CP24: ORAL_CAPSULE | ORAL | 3 refills | Status: DC

## 2021-11-13 MED ORDER — DONEPEZIL HCL 10 MG PO TABS: ORAL_TABLET | ORAL | 3 refills | Status: DC

## 2021-11-13 MED ORDER — PRIMIDONE 50 MG PO TABS: 50.0000 mg | ORAL_TABLET | Freq: Every day | ORAL | 3 refills | Status: DC

## 2021-11-13 MED ORDER — MEMANTINE HCL 10 MG PO TABS: 10.0000 mg | ORAL_TABLET | Freq: Two times a day (BID) | ORAL | 4 refills | Status: DC

## 2021-11-13 NOTE — Progress Notes (Signed)
PATIENT: Mia [name] DOB: 09-Nov-1950  REASON FOR VISIT: follow up HISTORY FROM: patient  HISTORY OF PRESENT ILLNESS: Today 11/13/21:  Mia [name] is a 71 year old female with a history of memory disturbance, tremor and fibromyalgia.  She returns today for follow-up.  Tremor: Reports it is stable.  Continues on primidone 50 mg at bedtime.  Husband reports that he has not noticed a tremor in the neck like he used to.  Fibromyalgia: Stable on Effexor.  Memory disturbance: Reports that she sometimes has trouble remembering dates has to use a calendar.  Did not remember a wedding that she was in 18 years ago.  She lives at home with her husband.  Able to complete all ADLs independently.  She manages her own medications and appointments.  Denies any trouble sleeping.  Reports good appetite.  Does report that she will have back surgery January 31 for ruptured disc.  03/04/21: Mia [name] is a 71 year old female with a history of memory disturbance, tremor and fibromyalgia.  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  The patient reports today that years ago when she was at [facility] this was also mentioned as a diagnosis.  She has had normal nerve conduction with EMGs in the past.  In regards to her tremor she feels that Mysoline continues to work fairly well for her.    Fibromyalgia: Continues on Effexor feels that this works well for her.  In regards to her memory she feels that it has gotten worse since having Covid in January.  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  She is able to complete all ADLs independently.  She has noticed some change in her balance.  States that she recently had a fall and injured her elbow. HISTORY:  Feels that her tremor has been well controlled with Mysoline.   Primarily just affects the hands  Reports that fibromyalgia continues to be fairly controlled with Effexor.  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  She returns today for an evaluation.  HISTORY Mia [name] is a 71 year old right-handed white female with a history of a mild memory disturbance, she is on Aricept and Namenda.  She contracted the Covid virus and required hospitalization on 06 November 2019.  The patient has had some increase in fatigue and some increased cognitive slowing since that time.  She has had ongoing pain in the neck and low back, she is followed through an orthopedic surgeon.  She had a recent MRI of the cervical and lumbar spine done through [facility] in [place].  The results of the studies are not available to me.  The patient does report some gait instability, she has not had any recent falls.  She does not use a cane.  She returns the office today for an evaluation.  She is on very low-dose Mysoline taking 50 mg at night.  REVIEW OF SYSTEMS: Out of a complete 14 system review of symptoms, the patient complains only of the following symptoms, and all other reviewed systems are negative.  See HPI  ALLERGIES: Allergies  Allergen Reactions   Codeine Nausea And Vomiting   Myrbetriq  [Mirabegron] Other (See Comments)    SEVERE HEADACHE   Nystatin    Percocet [Oxycodone-Acetaminophen] Nausea And Vomiting   Propoxyphene Nausea And Vomiting  Celebrex [Celecoxib] Other (See Comments)    Other reaction(s): Other flushed   Darvocet [Propoxyphene N-Acetaminophen] Nausea And Vomiting   Erythromycin Nausea And Vomiting    Nausea and vomiting    Hydrocodone Nausea And Vomiting   Lyrica [Pregabalin] Swelling    Legs swelling    Macrodantin [Nitrofurantoin] Nausea And Vomiting   Toradol [Ketorolac Tromethamine] Nausea And Vomiting    Per patient, only PO form causes nausea and vomiting. Can take injection without issue   Tramadol Nausea And Vomiting   Verapamil  Nausea And Vomiting    REACTION: intolerance    HOME MEDICATIONS: Outpatient Medications Prior to Visit  Medication Sig Dispense Refill   amLODipine (NORVASC) 5 MG tablet Take 1 tablet (5 mg total) by mouth daily. Please make yearly appt with Dr. Lovena Le for February 2023 for future refills. Thank you 1st attempt 90 tablet 0   colestipol (COLESTID) 1 g tablet Take 1 g by mouth 2 (two) times daily.     Cyanocobalamin (VITAMIN B-12 IJ) Inject 1 mL as directed every 30 (thirty) days.      denosumab (PROLIA) 60 MG/ML SOLN injection Inject 60 mg into the skin every 6 (six) months. Administer in upper arm, thigh, or abdomen     donepezil (ARICEPT) 10 MG tablet TAKE ONE TABLET BY MOUTH EVERY NIGHT AT BEDTIME **MUST BE SEEN FOR MORE REFILLS** 90 tablet 0   gabapentin (NEURONTIN) 300 MG capsule TAKE 1 TO 2 CAPSULES BY MOUTH UP TO TWO TIMES DAILY AS NEEDED (Patient taking differently: 300 mg 3 (three) times daily. TAKE 2 tablets 3x daily) 180 capsule 3   ibuprofen (ADVIL) 600 MG tablet ibuprofen 600 mg tablet  TAKE 1 TABLET BY MOUTH EVERY 6 HOURS AS NEEDED     ipratropium (ATROVENT) 0.03 % nasal spray Place 2 sprays into both nostrils every 12 (twelve) hours. 30 mL 1   leflunomide (ARAVA) 20 MG tablet TAKE ONE TABLET BY MOUTH DAILY 90 tablet 0   losartan (COZAAR) 50 MG tablet Take 2 tablets (100 mg total) by mouth daily. Take 1 tablet in the morning and 1 tablet in evening as needed for blood pressure greater than 150 180 tablet 3   Melatonin 10 MG CAPS Take 10 capsules by mouth daily.     memantine (NAMENDA) 10 MG tablet Take 1 tablet (10 mg total) by mouth 2 (two) times daily. 180 tablet 4   metoprolol tartrate (LOPRESSOR) 100 MG tablet TAKE 1 AND 1/2 TABLETS TWICE DAILY 45 tablet 0   ondansetron (ZOFRAN-ODT) 8 MG disintegrating tablet Take 1 tablet by mouth as needed for nausea.     pantoprazole (PROTONIX) 40 MG tablet Take 40 mg by mouth 2 (two) times daily.     polyethylene glycol powder  (GLYCOLAX/MIRALAX) 17 GM/SCOOP powder Take 17 g by mouth daily as needed for mild constipation or moderate constipation.      primidone (MYSOLINE) 50 MG tablet TAKE 1 TABLET AT BEDTIME 90 tablet 1   venlafaxine XR (EFFEXOR-XR) 75 MG 24 hr capsule TAKE ONE CAPSULE BY MOUTH DAILY WITH BREAFAST **MUST BE SEEN FOR FURTHER REFILLS** 90 capsule 0   benzonatate (TESSALON) 100 MG capsule Take 1 capsule (100 mg total) by mouth 3 (three) times daily as needed. 30 capsule 0   doxycycline (VIBRA-TABS) 100 MG tablet Take 1 tablet (100 mg total) by mouth 2 (two) times daily. 20 tablet 0   predniSONE (DELTASONE) 20 MG tablet Take 2 tablets (40 mg total) by mouth daily with breakfast.  10 tablet 0   promethazine-dextromethorphan (PROMETHAZINE-DM) 6.25-15 MG/5ML syrup Take 5 mLs by mouth 4 (four) times daily as needed for cough. 118 mL 0   No facility-administered medications prior to visit.    PAST MEDICAL HISTORY: Past Medical History:  Diagnosis Date   Anal fissure    Anemia    Arthritis    Blood transfusion    C. difficile colitis    Chest pain    a. 01/2013 MV: EF 59%, no ischemia.   Chronic Dyspnea    a. 01/2013 Echo: EF 60-65%, Gr 1 DD, PASP 65mmHg.// Echo 01/2020: EF 60-65, no RWMA, GR 1 DD, GLS -21.6%, normal RV SF, trivial MR    COVID-19    DDD (degenerative disc disease), cervical    DDD (degenerative disc disease), lumbar    Diabetes mellitus    Fibromyalgia    Gall stones    GERD (gastroesophageal reflux disease)    gastritis   Hemorrhoids    Hypertension    Interstitial cystitis    MCI (mild cognitive impairment) 11/25/2017   Memory difficulty 12/14/2013   Neuromuscular disorder (HCC)    sclerosis   Osteoporosis    Peptic ulcer    PONV (postoperative nausea and vomiting)    Pseudogout    Raynaud phenomenon    Rectal bleeding    Sleep apnea    a. on cpap.   SVT (supraventricular tachycardia) ([facility])    Syncope 09/10/2015   Thyroid disease    hypothyroidism   Tremor, essential  05/14/2016    PAST SURGICAL HISTORY: Past Surgical History:  Procedure Laterality Date   APPENDECTOMY     BLADDER SURGERY     x2   BLADDER SURGERY     BREAST BIOPSY     CARDIAC CATHETERIZATION     CARPAL TUNNEL RELEASE     CATARACT EXTRACTION Bilateral    CHOLECYSTECTOMY     DILATION AND CURETTAGE OF UTERUS     ENTEROCELE REPAIR     x2   EYE SURGERY     retina   hysterectomy - unknown type     JOINT REPLACEMENT     KNEE ARTHROPLASTY     KNEE SURGERY     x6   NECK SURGERY     fusion   OOPHORECTOMY     QUADRICEPS REPAIR Right    RECTOCELE REPAIR     x2   SHOULDER ARTHROSCOPY WITH SUBACROMIAL DECOMPRESSION, ROTATOR CUFF REPAIR AND BICEP TENDON REPAIR  10/06/2012   Procedure: SHOULDER ARTHROSCOPY WITH SUBACROMIAL DECOMPRESSION, ROTATOR CUFF REPAIR AND BICEP TENDON REPAIR;  Surgeon: Nita Sells, MD;  Location: [facility];  Service: Orthopedics;  Laterality: Right;  Arthroscopic  Repair  of  Subscapularis, Open Biceps Tenodesis   SHOULDER SURGERY     bilateral- bones spur   SHOULDER SURGERY Left 04/15/2021   TONSILLECTOMY     TOTAL KNEE ARTHROPLASTY     TOTAL SHOULDER ARTHROPLASTY      FAMILY HISTORY: Family History  Problem Relation Age of Onset   Heart attack Mother    Stroke Mother    Diabetes Mother    Heart disease Mother    Colon polyps Mother    Asthma Mother    Dementia Mother    Heart disease Father    Heart attack Father    Asthma Father    Stroke Sister    Hypertension Sister    Rheum arthritis Sister    Dementia Sister    Asthma Sister  Lupus Sister    Asthma Sister    Breast cancer Maternal Grandmother    Wilson's disease Maternal Grandmother    Pancreatic cancer Maternal Grandfather     SOCIAL HISTORY: Social History   Socioeconomic History   Marital status: Married    Spouse name: Clarnce Flock"   Number of children: 2   Years of education: 14   Highest education level: Associate degree: academic program   Occupational History   Occupation: Retired  Tobacco Use   Smoking status: Never   Smokeless tobacco: Never  Vaping Use   Vaping Use: Never used  Substance and Sexual Activity   Alcohol use: No    Alcohol/week: 0.0 standard drinks   Drug use: No   Sexual activity: Not on file  Other Topics Concern   Not on file  Social History Narrative   Lives with her husband. She likes to hang out with her friends and play games and do bible study.   Social Determinants of Health   Financial Resource Strain: Low Risk    Difficulty of Paying Living Expenses: Not hard at all  Food Insecurity: No Food Insecurity   Worried About Charity fundraiser in the Last Year: Never true   Bend in the Last Year: Never true  Transportation Needs: No Transportation Needs   Lack of Transportation (Medical): No   Lack of Transportation (Non-Medical): No  Physical Activity: Inactive   Days of Exercise per Week: 0 days   Minutes of Exercise per Session: 0 min  Stress: No Stress Concern Present   Feeling of Stress : Not at all  Social Connections: Socially Integrated   Frequency of Communication with Friends and Family: More than three times a week   Frequency of Social Gatherings with Friends and Family: More than three times a week   Attends Religious Services: More than 4 times per year   Active Member of [employer] or Organizations: Yes   Attends Music therapist: More than 4 times per year   Marital Status: Married  Human resources officer Violence: Not At Risk   Fear of Current or Ex-Partner: No   Emotionally Abused: No   Physically Abused: No   Sexually Abused: No      PHYSICAL EXAM  Vitals:   11/13/21 1358  BP: 132/81  Pulse: (!) 58  Weight: 174 lb 6.4 oz (79.1 kg)  Height: 5\' 6"  (1.676 m)   Body mass index is 28.15 kg/m.   MMSE - Mini Mental State Exam 11/13/2021 03/04/2021 08/28/2020  Orientation to time 5 2 3   Orientation to Place 5 5 5   Registration 3 3 3   Attention/  Calculation 4 5 1   Recall 2 2 3   Language- name 2 objects 2 2 2   Language- repeat 1 1 1   Language- follow 3 step command 3 3 3   Language- read & follow direction 1 1 1   Write a sentence 1 1 1   Copy design 1 1 1   Copy design-comments - - -  Total score 28 26 24      Generalized: Well developed, in no acute distress   Neurological examination  Mentation: Alert oriented to time, place, history taking. Follows all commands speech and language fluent Cranial nerve II-XII: Pupils were equal round reactive to light. Extraocular movements were full, visual field were full on confrontational test. Facial sensation and strength were normal. Uvula tongue midline. Head turning and shoulder shrug  were normal and symmetric. Motor: The motor testing  reveals 5 over 5 strength.  Patient has giveaway weakness noted in the left upper extremity.  Good symmetric motor tone is noted throughout.  Sensory: Sensory testing is intact to soft touch on all 4 extremities. No evidence of extinction is noted.  Coordination: Cerebellar testing reveals good finger-nose-finger and heel-to-shin bilaterally.  Gait and station: Gait is normal. Reflexes: Deep tendon reflexes are symmetric and normal bilaterally.   DIAGNOSTIC DATA (LABS, IMAGING, TESTING) - I reviewed patient records, labs, notes, testing and imaging myself where available.  Lab Results  Component Value Date   WBC 5.7 09/09/2021   HGB 14.4 09/09/2021   HCT 43.0 09/09/2021   MCV 91.7 09/09/2021   PLT 405 (H) 09/09/2021      Component Value Date/Time   NA 134 (L) 09/09/2021 1107   NA 138 01/23/2020 1602   K 4.6 09/09/2021 1107   CL 98 09/09/2021 1107   CO2 30 09/09/2021 1107   GLUCOSE 69 09/09/2021 1107   BUN 11 09/09/2021 1107   BUN 9 01/23/2020 1602   CREATININE 0.86 09/09/2021 1107   CALCIUM 10.0 09/09/2021 1107   PROT 7.0 09/09/2021 1107   PROT 5.9 (L) 06/07/2019 0934   ALBUMIN 4.0 05/21/2020 0819   ALBUMIN 3.9 06/07/2019 0934   AST 29  09/09/2021 1107   ALT 28 09/09/2021 1107   ALKPHOS 73 05/21/2020 0819   BILITOT 0.4 09/09/2021 1107   BILITOT 0.3 06/07/2019 0934   GFRNONAA 81 03/10/2021 1008   GFRAA 94 03/10/2021 1008   Lab Results  Component Value Date   CHOL 215 (H) 08/01/2021   HDL 68 08/01/2021   LDLCALC 123 (H) 08/01/2021   TRIG 125 08/01/2021   CHOLHDL 3.2 08/01/2021   Lab Results  Component Value Date   HGBA1C 5.8 (A) 07/22/2021   Lab Results  Component Value Date   VITAMINB12 479 05/01/2021   Lab Results  Component Value Date   TSH 1.34 05/21/2020      ASSESSMENT AND PLAN 71 y.o. year old female  has a past medical history of Anal fissure, Anemia, Arthritis, Blood transfusion, C. difficile colitis, Chest pain, Chronic Dyspnea, COVID-19, DDD (degenerative disc disease), cervical, DDD (degenerative disc disease), lumbar, Diabetes mellitus, Fibromyalgia, Gall stones, GERD (gastroesophageal reflux disease), Hemorrhoids, Hypertension, Interstitial cystitis, MCI (mild cognitive impairment) (11/25/2017), Memory difficulty (12/14/2013), Neuromuscular disorder ([facility]), Osteoporosis, Peptic ulcer, PONV (postoperative nausea and vomiting), Pseudogout, Raynaud phenomenon, Rectal bleeding, Sleep apnea, SVT (supraventricular tachycardia) ([facility]), Syncope (09/10/2015), Thyroid disease, and Tremor, essential (05/14/2016). here with :  1. Tremor  - Stable - Continue Mysoline 50 mg daily at bedtime  2.  Fibromyalgia   -Continue Effexor 75 mg daily  3.  Memory disturbance  - MMSE 28/30-stable -Continue Namenda 10 mg twice a day -Continue Aricept 10 mg at bedtime   Follow-up in 6 months or sooner if needed.  Patient was established with [name].  He has now retired.  Her primary neurologist will be Dr. Leta Baptist however the patient symptoms have remained relatively stable.  She will continue to follow-up with me for now.    Rother Givens, MSN, NP-C 11/13/2021, 2:10 PM [facility] [address], [place], [place] [address] [phone]

## 2021-11-20 ENCOUNTER — Ambulatory Visit (INDEPENDENT_AMBULATORY_CARE_PROVIDER_SITE_OTHER): Payer: Medicare Other | Admitting: Family Medicine

## 2021-11-20 ENCOUNTER — Other Ambulatory Visit: Payer: Self-pay

## 2021-11-20 ENCOUNTER — Encounter: Payer: Self-pay | Admitting: Cardiovascular Disease

## 2021-11-20 ENCOUNTER — Inpatient Hospital Stay: Admit: 2021-11-20 | Payer: MEDICARE | Primary: Internal Medicine

## 2021-11-20 VITALS — BP 98/52 | HR 70

## 2021-11-20 DIAGNOSIS — E538 Deficiency of other specified B group vitamins: Secondary | ICD-10-CM | POA: Diagnosis not present

## 2021-11-20 DIAGNOSIS — C50512 Malignant neoplasm of lower-outer quadrant of left female breast: Secondary | ICD-10-CM

## 2021-11-20 MED ORDER — CYANOCOBALAMIN 1000 MCG/ML IJ SOLN
1000.0000 ug | Freq: Once | INTRAMUSCULAR | Status: AC
  Administered 2021-11-20: 1000 ug via INTRAMUSCULAR

## 2021-11-20 NOTE — Progress Notes (Signed)
Established Patient Office Visit  Subjective:  Patient ID: Mia [name], female    DOB: 1950/12/13  Age: 71 y.o. MRN: [id number]  CC:  Chief Complaint  Patient presents with   Pernicious Anemia    HPI Mia [name] is here for a vitamin B 12 injection. Denies muscle cramps, weakness or irregular heart rate.    Low blood pressure today.   Past Medical History:  Diagnosis Date   Anal fissure    Anemia    Arthritis    Blood transfusion    C. difficile colitis    Chest pain    a. 01/2013 MV: EF 59%, no ischemia.   Chronic Dyspnea    a. 01/2013 Echo: EF 60-65%, Gr 1 DD, PASP 36mHg.// Echo 01/2020: EF 60-65, no RWMA, GR 1 DD, GLS -21.6%, normal RV SF, trivial MR    COVID-19    DDD (degenerative disc disease), cervical    DDD (degenerative disc disease), lumbar    Diabetes mellitus    Fibromyalgia    Gall stones    GERD (gastroesophageal reflux disease)    gastritis   Hemorrhoids    Hypertension    Interstitial cystitis    MCI (mild cognitive impairment) 11/25/2017   Memory difficulty 12/14/2013   Neuromuscular disorder (HCC)    sclerosis   Osteoporosis    Peptic ulcer    PONV (postoperative nausea and vomiting)    Pseudogout    Raynaud phenomenon    Rectal bleeding    Sleep apnea    a. on cpap.   SVT (supraventricular tachycardia) ([facility]    Syncope 09/10/2015   Thyroid disease    hypothyroidism   Tremor, essential 05/14/2016    Past Surgical History:  Procedure Laterality Date   APPENDECTOMY     BLADDER SURGERY     x2   BLADDER SURGERY     BREAST BIOPSY     CARDIAC CATHETERIZATION     CARPAL TUNNEL RELEASE     CATARACT EXTRACTION Bilateral    CHOLECYSTECTOMY     DILATION AND CURETTAGE OF UTERUS     ENTEROCELE REPAIR     x2   EYE SURGERY     retina   hysterectomy - unknown type     JOINT REPLACEMENT     KNEE ARTHROPLASTY     KNEE SURGERY     x6   NECK SURGERY     fusion   OOPHORECTOMY     QUADRICEPS REPAIR Right    RECTOCELE REPAIR     x2    SHOULDER ARTHROSCOPY WITH SUBACROMIAL DECOMPRESSION, ROTATOR CUFF REPAIR AND BICEP TENDON REPAIR  10/06/2012   Procedure: SHOULDER ARTHROSCOPY WITH SUBACROMIAL DECOMPRESSION, ROTATOR CUFF REPAIR AND BICEP TENDON REPAIR;  Surgeon: JNita Sells MD;  [facility]  Service: Orthopedics;  Laterality: Right;  Arthroscopic  Repair  of  Subscapularis, Open Biceps Tenodesis   SHOULDER SURGERY     bilateral- bones spur   SHOULDER SURGERY Left 04/15/2021   TONSILLECTOMY     TOTAL KNEE ARTHROPLASTY     TOTAL SHOULDER ARTHROPLASTY      Family History  Problem Relation Age of Onset   Heart attack Mother    Stroke Mother    Diabetes Mother    Heart disease Mother    Colon polyps Mother    Asthma Mother    Dementia Mother    Heart disease Father    Heart attack Father    Asthma Father  Stroke Sister    Hypertension Sister    Rheum arthritis Sister    Dementia Sister    Asthma Sister    Lupus Sister    Asthma Sister    Breast cancer Maternal Grandmother    Wilson's disease Maternal Grandmother    Pancreatic cancer Maternal Grandfather     Social History   Socioeconomic History   Marital status: Married    Spouse name: Clarnce Flock"   Number of children: 2   Years of education: 14   Highest education level: Associate degree: academic program  Occupational History   Occupation: Retired  Tobacco Use   Smoking status: Never   Smokeless tobacco: Never  Vaping Use   Vaping Use: Never used  Substance and Sexual Activity   Alcohol use: No    Alcohol/week: 0.0 standard drinks   Drug use: No   Sexual activity: Not on file  Other Topics Concern   Not on file  Social History Narrative   Lives with her husband. She likes to hang out with her friends and play games and do bible study.   Social Determinants of Health   Financial Resource Strain: Low Risk    Difficulty of Paying Living Expenses: Not hard at all  Food Insecurity: No Food Insecurity    Worried About Charity fundraiser in the Last Year: Never true   [place] in the Last Year: Never true  Transportation Needs: No Transportation Needs   Lack of Transportation (Medical): No   Lack of Transportation (Non-Medical): No  Physical Activity: Inactive   Days of Exercise per Week: 0 days   Minutes of Exercise per Session: 0 min  Stress: No Stress Concern Present   Feeling of Stress : Not at all  Social Connections: Socially Integrated   Frequency of Communication with Friends and Family: More than three times a week   Frequency of Social Gatherings with Friends and Family: More than three times a week   Attends Religious Services: More than 4 times per year   Active Member of [employer] or Organizations: Yes   Attends Music therapist: More than 4 times per year   Marital Status: Married  Human resources officer Violence: Not At Risk   Fear of Current or Ex-Partner: No   Emotionally Abused: No   Physically Abused: No   Sexually Abused: No    Outpatient Medications Prior to Visit  Medication Sig Dispense Refill   amLODipine (NORVASC) 5 MG tablet Take 1 tablet (5 mg total) by mouth daily. Please make yearly appt with Dr. Lovena Le for February 2023 for future refills. Thank you 1st attempt 90 tablet 0   cetirizine (ZYRTEC) 10 MG tablet Take 1 tablet by mouth in the morning.     colestipol (COLESTID) 1 g tablet Take 1 g by mouth 2 (two) times daily.     Cyanocobalamin (VITAMIN B-12 IJ) Inject 1 mL as directed every 30 (thirty) days.      denosumab (PROLIA) 60 MG/ML SOLN injection Inject 60 mg into the skin every 6 (six) months. Administer in upper arm, thigh, or abdomen     donepezil (ARICEPT) 10 MG tablet TAKE ONE TABLET BY MOUTH EVERY NIGHT AT BEDTIME 90 tablet 3   gabapentin (NEURONTIN) 300 MG capsule TAKE 1 TO 2 CAPSULES BY MOUTH UP TO TWO TIMES DAILY AS NEEDED (Patient taking differently: 300 mg 3 (three) times daily. TAKE 2 tablets 3x daily) 180 capsule 3   ibuprofen  (ADVIL) 600  MG tablet ibuprofen 600 mg tablet  TAKE 1 TABLET BY MOUTH EVERY 6 HOURS AS NEEDED     ipratropium (ATROVENT) 0.03 % nasal spray Place 2 sprays into both nostrils every 12 (twelve) hours. 30 mL 1   ipratropium (ATROVENT) 0.06 % nasal spray two sprays by Both Nostrils route as needed for Rhinitis.     leflunomide (ARAVA) 20 MG tablet TAKE ONE TABLET BY MOUTH DAILY 90 tablet 0   losartan (COZAAR) 50 MG tablet Take 2 tablets (100 mg total) by mouth daily. Take 1 tablet in the morning and 1 tablet in evening as needed for blood pressure greater than 150 180 tablet 3   Melatonin 10 MG CAPS Take 10 capsules by mouth daily.     memantine (NAMENDA) 10 MG tablet Take 1 tablet (10 mg total) by mouth 2 (two) times daily. 180 tablet 4   metoprolol tartrate (LOPRESSOR) 100 MG tablet TAKE 1 AND 1/2 TABLETS TWICE DAILY 45 tablet 0   ondansetron (ZOFRAN-ODT) 8 MG disintegrating tablet Take 1 tablet by mouth as needed for nausea.     pantoprazole (PROTONIX) 40 MG tablet Take 40 mg by mouth 2 (two) times daily.     polyethylene glycol powder (GLYCOLAX/MIRALAX) 17 GM/SCOOP powder Take 17 g by mouth daily as needed for mild constipation or moderate constipation.      primidone (MYSOLINE) 50 MG tablet Take 1 tablet (50 mg total) by mouth at bedtime. 90 tablet 3   promethazine-dextromethorphan (PROMETHAZINE-DM) 6.25-15 MG/5ML syrup Take 5 mLs by mouth 4 (four) times daily as needed for cough. 118 mL 0   venlafaxine XR (EFFEXOR-XR) 75 MG 24 hr capsule TAKE ONE CAPSULE BY MOUTH DAILY WITH BREAFAST 90 capsule 3   benzonatate (TESSALON) 100 MG capsule Take 1 capsule (100 mg total) by mouth 3 (three) times daily as needed. 30 capsule 0   doxycycline (VIBRA-TABS) 100 MG tablet Take 1 tablet (100 mg total) by mouth 2 (two) times daily. 20 tablet 0   predniSONE (DELTASONE) 20 MG tablet Take 2 tablets (40 mg total) by mouth daily with breakfast. 10 tablet 0   No facility-administered medications prior to visit.     Allergies  Allergen Reactions   Codeine Nausea And Vomiting   Myrbetriq  [Mirabegron] Other (See Comments)    SEVERE HEADACHE   Nystatin    Percocet [Oxycodone-Acetaminophen] Nausea And Vomiting   Propoxyphene Nausea And Vomiting   Celebrex [Celecoxib] Other (See Comments)    Other reaction(s): Other flushed   Darvocet [Propoxyphene N-Acetaminophen] Nausea And Vomiting   Erythromycin Nausea And Vomiting    Nausea and vomiting    Hydrocodone Nausea And Vomiting   Lyrica [Pregabalin] Swelling    Legs swelling    Macrodantin [Nitrofurantoin] Nausea And Vomiting   Toradol [Ketorolac Tromethamine] Nausea And Vomiting    Per patient, only PO form causes nausea and vomiting. Can take injection without issue   Tramadol Nausea And Vomiting   Verapamil Nausea And Vomiting    REACTION: intolerance    ROS Review of Systems    Objective:    Physical Exam  BP (!) 98/52    Pulse 70    SpO2 98%  Wt Readings from Last 3 Encounters:  11/13/21 174 lb 6.4 oz (79.1 kg)  08/20/21 171 lb 0.6 oz (77.6 kg)  07/22/21 170 lb (77.1 kg)     Health Maintenance Due  Topic Date Due   FOOT EXAM  11/15/2019   COVID-19 Vaccine (4 - Booster for [employer]  series) 11/29/2020    There are no preventive care reminders to display for this patient.  Lab Results  Component Value Date   TSH 1.34 05/21/2020   Lab Results  Component Value Date   WBC 5.7 09/09/2021   HGB 14.4 09/09/2021   HCT 43.0 09/09/2021   MCV 91.7 09/09/2021   PLT 405 (H) 09/09/2021   Lab Results  Component Value Date   NA 134 (L) 09/09/2021   K 4.6 09/09/2021   CO2 30 09/09/2021   GLUCOSE 69 09/09/2021   BUN 11 09/09/2021   CREATININE 0.86 09/09/2021   BILITOT 0.4 09/09/2021   ALKPHOS 73 05/21/2020   AST 29 09/09/2021   ALT 28 09/09/2021   PROT 7.0 09/09/2021   ALBUMIN 4.0 05/21/2020   CALCIUM 10.0 09/09/2021   ANIONGAP 12 02/22/2020   EGFR 73 09/09/2021   GFR 74.39 05/21/2020   Lab Results  Component  Value Date   CHOL 215 (H) 08/01/2021   Lab Results  Component Value Date   HDL 68 08/01/2021   Lab Results  Component Value Date   LDLCALC 123 (H) 08/01/2021   Lab Results  Component Value Date   TRIG 125 08/01/2021   Lab Results  Component Value Date   CHOLHDL 3.2 08/01/2021   Lab Results  Component Value Date   HGBA1C 5.8 (A) 07/22/2021      Assessment & Plan:  B 12 deficiency - Patient tolerated injection well without complications. Patient advised to schedule next injection 30 days from today.    Low blood pressure - Patient advised to stop the amlodipine and half the losartan to 25 mg daily, per Dr Madilyn Fireman. Check blood pressure readings daily and call if elevated.   Problem List Items Addressed This Visit     B12 deficiency - Primary    Meds ordered this encounter  Medications   cyanocobalamin ((VITAMIN B-12)) injection 1,000 mcg    Follow-up: Return in about 4 weeks (around 12/18/2021) for B12 injection. Durene Romans, Monico Blitz, Beatrice

## 2021-11-20 NOTE — Progress Notes (Signed)
Cardiology Office Note   Date:  11/21/2021   ID:  Mia [name], DOB [birth year], MRN [id number]  PCP:  Mia Marry, MD  Cardiologist:   Mia Moores, MD   Chief Complaint  Patient presents with   Hypertension        Tachycardia   1. SVT 2. Raynaud's Phenomenon 3. Diabetes Mellitus 4. Minimal CAD by cath 2006 Mia [name]) 5. Sleep apnea - CPAP is currently not working  02/11/05 Right heart catheterization:   RA pressure: 8 mmHg   RV pressure: 25/7 mmHg   PA pressure: 20/9 mmHg   PWCP: 9 mmHg   CO: 4.6 liters per minute  LEFT HEART CATHETERIZATION RESULTS:  1. Left main: No significant disease.   2. LAD: Mild luminal irregularities.   3. First and second diagonal: Moderate size, mild luminal irregularities.   4. Left circumflex: Nondominant, mild luminal irregularities.   5. RCA: Dominant with mild luminal irregularities.   6. LV: EF is 55 to 60%, no wall motion abnormalities. LV EDP was 9 mmHg.   December 28, 2014:  Mia [name] is a 71 y.o. female who presents for follow-up with an episode of chest pain. She was seen by our nurse practitioner, Mia [name].    Diltiazem was DC'd.   She was started on Metoprolol 50 bid.   Myoview study was normal. She has normal left ventricle systolic function. She is going to the [facility] 3 times a week.  Doing well.     She is feeling much better.    Sept. 1, 2016:  Has had C diff for the past several months .  Doing well from a cardiac standpoint .  Has been fatigued.  Doing well with the metoprolol  Still has shortness of breath .   Nov. 3, 2017:    Mia [name] is seen today with her husband, Mia [name].   Last week she started having some heart facing .  HR was 187 (using the app on her phone )  Could feel it in her ears. Got a little dizzy.  Sat down and relaxed and the episode resolved.  Had another episode this past Sunday .  HR stayed elevated for 2 hours   Aug. 24, 2018: Mia [name] is seen today with husband Mia [name].    Followed for HTN and hyperlipidemia  Has had some elevated diastolic BP readings . Eats some salty foods still - likes ham. Complains of leg pain / ache when she is standing .    Nov. 16, 2018  Mia [name] was having more SVT, we have increased the metoprolol  Up to metoprolol 100 mg BID. She is much better controlled at this point Wearing the event monitor   December 08, 2017:  Mia [name] is seen today for follow-up of her supraventricular tachycardia  We had increased her metoprolol to 100 mg BID  Has occasional palpitations  No CP ,  No severe dyspnea. Does has some DOE that is likely due to generalized deconditioning  Needs left knee replacement this summer. Needs carple tunnel surgery in both wrists.  Has episodes of diaphoresis   Mar 11, 2018: Mia [name] is seen today for follow-up of her supraventricular tachycardia.  We increased her metoprolol up to 150 mill grams twice a day during her last visit. Tolerating the metoprolol well  BP is still is elevated at times.     Eats lots of sandwiches, salami,  Her diet is "terrible"  According to her . - eats snacks instead of  real meals, sandwiches,   She is surprised that she hasnt lost any weight .   Nov. 4,, 2019:  Mia [name] is here follow-up with her supraventricular tachycardia and hypertension. Palpitations have improved . BP is still elevated.    Avoids salt for the most part  Still eats Nabs crackers regularly   Feb. 9. 2021  Mia [name] is seen today for follow-up visit. She has a history of supraventricular tachycardia.  She has a history of sleep apnea and is on CPAP. She tested positive for Covid on November 07, 2019.  She had symptoms of diarrhea, nausea, vomiting and inability to take p.o. food.  She was in [facility] for 6 days.  BP was elevated in the hospital  Was started on amlodipine.   Doubled her Losartan ( was to take at night)  She was seen by Mia Ades, NP on February 1  When she saw Mia [name]'s last week she was  having problems with hypertension.  Amlodipine had been added. She continued to have lots of fatigue.  BP is  Is eating and drinking normally   April 19, 2020:  Mia [name] is seen today for follow-up of her hypertension and history of SVT.  She tested positive for Covid in January, 2021.  She has been gradually improving.  She had significant leg edema and dysnea .  Saw her primary MD who gave her some IV lasix and started HCTZ.  She felt better after diuresing 20-25 lbs.  Still having episodes of tachycardia - despite being on metoprolol 150 mg BID  We discussed referral to EP for further eval of this   Jan. 27, 2022: Mia [name] is seen back for follow up of her SVT and HTN She has been seen by Dr. Lovena Le of EP who determined that she did not seem to be having significant episodes of SVT.   Most of her episodes occurred with exertion ( and were likely sinus tach)   Dr. Lovena Le thinks she might be a good candidate for ablation  Wanted to wait and get BP down.  He added amlodipine and increased losartan  Is avoiding salt  Has lost weight ,   Her appetite has not been very good recently  Has knee arthritis which is limited her now   Has had occasional episodes  Of SVT  Is on a high dose of metoprolol    Jan. 27, 2023 Mia [name] is seen today for follow up of her SVT, HTN Seen with husband , Mia [name]   She had some DOE at her last visit that we thought may be due to generalized deconditioning.  Has had shoulder surgery  Now has a ruptured disc and is going to have surgery Tuesday  Has had  a few episodes of shortness of breath Occasional while sitting ,  Has some palpitations - "hard heart beat " No real exercise with her back issues   Has random episodes of dizziness. Occasionally while walking  Woozy like feeling  Has had an event monitor,  baseline bradycardia Has seen Dr. Lovena Le of EP - she is on a high dose of metoprolol which is controlling her SVT fairly well.  At some point, she may  need an SVT ablation and then we could be able to reduce her dose of metoprolol     Past Medical History:  Diagnosis Date   Anal fissure    Anemia    Arthritis    Blood transfusion    C. difficile colitis  Chest pain    a. 01/2013 MV: EF 59%, no ischemia.   Chronic Dyspnea    a. 01/2013 Echo: EF 60-65%, Gr 1 DD, PASP 25mmHg.// Echo 01/2020: EF 60-65, no RWMA, GR 1 DD, GLS -21.6%, normal RV SF, trivial MR    COVID-19    DDD (degenerative disc disease), cervical    DDD (degenerative disc disease), lumbar    Diabetes mellitus    Fibromyalgia    Gall stones    GERD (gastroesophageal reflux disease)    gastritis   Hemorrhoids    Hypertension    Interstitial cystitis    MCI (mild cognitive impairment) 11/25/2017   Memory difficulty 12/14/2013   Neuromuscular disorder (HCC)    sclerosis   Osteoporosis    Peptic ulcer    PONV (postoperative nausea and vomiting)    Pseudogout    Raynaud phenomenon    Rectal bleeding    Sleep apnea    a. on cpap.   SVT (supraventricular tachycardia) ([place])    Syncope 09/10/2015   Thyroid disease    hypothyroidism   Tremor, essential 05/14/2016    Past Surgical History:  Procedure Laterality Date   APPENDECTOMY     BLADDER SURGERY     x2   BLADDER SURGERY     BREAST BIOPSY     CARDIAC CATHETERIZATION     CARPAL TUNNEL RELEASE     CATARACT EXTRACTION Bilateral    CHOLECYSTECTOMY     DILATION AND CURETTAGE OF UTERUS     ENTEROCELE REPAIR     x2   EYE SURGERY     retina   hysterectomy - unknown type     JOINT REPLACEMENT     KNEE ARTHROPLASTY     KNEE SURGERY     x6   NECK SURGERY     fusion   OOPHORECTOMY     QUADRICEPS REPAIR Right    RECTOCELE REPAIR     x2   SHOULDER ARTHROSCOPY WITH SUBACROMIAL DECOMPRESSION, ROTATOR CUFF REPAIR AND BICEP TENDON REPAIR  10/06/2012   Procedure: SHOULDER ARTHROSCOPY WITH SUBACROMIAL DECOMPRESSION, ROTATOR CUFF REPAIR AND BICEP TENDON REPAIR;  Surgeon: Nita Sells, MD;  Location:  [facility];  Service: Orthopedics;  Laterality: Right;  Arthroscopic  Repair  of  Subscapularis, Open Biceps Tenodesis   SHOULDER SURGERY     bilateral- bones spur   SHOULDER SURGERY Left 04/15/2021   TONSILLECTOMY     TOTAL KNEE ARTHROPLASTY     TOTAL SHOULDER ARTHROPLASTY       Current Outpatient Medications  Medication Sig Dispense Refill   amLODipine (NORVASC) 5 MG tablet Take 1 tablet (5 mg total) by mouth daily. Please make yearly appt with Dr. Lovena Le for February 2023 for future refills. Thank you 1st attempt 90 tablet 0   cetirizine (ZYRTEC) 10 MG tablet Take 1 tablet by mouth in the morning.     colestipol (COLESTID) 1 g tablet Take 1 g by mouth 2 (two) times daily.     Cyanocobalamin (VITAMIN B-12 IJ) Inject 1 mL as directed every 30 (thirty) days.      denosumab (PROLIA) 60 MG/ML SOLN injection Inject 60 mg into the skin every 6 (six) months. Administer in upper arm, thigh, or abdomen     donepezil (ARICEPT) 10 MG tablet TAKE ONE TABLET BY MOUTH EVERY NIGHT AT BEDTIME 90 tablet 3   gabapentin (NEURONTIN) 300 MG capsule TAKE 1 TO 2 CAPSULES BY MOUTH UP TO TWO TIMES DAILY AS NEEDED (Patient  taking differently: 300 mg 3 (three) times daily. TAKE 2 tablets 3x daily) 180 capsule 3   ibuprofen (ADVIL) 600 MG tablet ibuprofen 600 mg tablet  TAKE 1 TABLET BY MOUTH EVERY 6 HOURS AS NEEDED     ipratropium (ATROVENT) 0.03 % nasal spray Place 2 sprays into both nostrils every 12 (twelve) hours. 30 mL 1   leflunomide (ARAVA) 20 MG tablet TAKE ONE TABLET BY MOUTH DAILY 90 tablet 0   losartan (COZAAR) 50 MG tablet Take 2 tablets (100 mg total) by mouth daily. Take 1 tablet in the morning and 1 tablet in evening as needed for blood pressure greater than 150 180 tablet 3   Melatonin 10 MG CAPS Take 10 capsules by mouth daily.     memantine (NAMENDA) 10 MG tablet Take 1 tablet (10 mg total) by mouth 2 (two) times daily. 180 tablet 4   metoprolol tartrate (LOPRESSOR) 100 MG tablet  TAKE 1 AND 1/2 TABLETS TWICE DAILY 45 tablet 0   ondansetron (ZOFRAN-ODT) 8 MG disintegrating tablet Take 1 tablet by mouth as needed for nausea.     pantoprazole (PROTONIX) 40 MG tablet Take 40 mg by mouth 2 (two) times daily.     polyethylene glycol powder (GLYCOLAX/MIRALAX) 17 GM/SCOOP powder Take 17 g by mouth daily as needed for mild constipation or moderate constipation.      primidone (MYSOLINE) 50 MG tablet Take 1 tablet (50 mg total) by mouth at bedtime. 90 tablet 3   venlafaxine XR (EFFEXOR-XR) 75 MG 24 hr capsule TAKE ONE CAPSULE BY MOUTH DAILY WITH BREAFAST 90 capsule 3   No current facility-administered medications for this visit.    Allergies:   Codeine, Myrbetriq  [mirabegron], Nystatin, Percocet [oxycodone-acetaminophen], Propoxyphene, Celebrex [celecoxib], Darvocet [propoxyphene n-acetaminophen], Erythromycin, Hydrocodone, Lyrica [pregabalin], Macrodantin [nitrofurantoin], Toradol [ketorolac tromethamine], Tramadol, and Verapamil    Social History:  The patient  reports that she has never smoked. She has never used smokeless tobacco. She reports that she does not drink alcohol and does not use drugs.   Family History:  The patient's family history includes Asthma in her father, mother, sister, and sister; Breast cancer in her maternal grandmother; Colon polyps in her mother; Dementia in her mother and sister; Diabetes in her mother; Heart attack in her father and mother; Heart disease in her father and mother; Hypertension in her sister; Lupus in her sister; Pancreatic cancer in her maternal grandfather; Rheum arthritis in her sister; Stroke in her mother and sister; Wilson's disease in her maternal grandmother.    ROS: Noted in current history, all other systems are negative.   Physical Exam: Blood pressure 124/72, pulse (!) 59, height 5\' 6"  (1.676 m), weight 172 lb 9.6 oz (78.3 kg), SpO2 99 %.  GEN:  Well nourished, well developed in no acute distress HEENT: Normal NECK:  No JVD; No carotid bruits LYMPHATICS: No lymphadenopathy CARDIAC: RRR , no murmurs, rubs, gallops RESPIRATORY:  Clear to auscultation without rales, wheezing or rhonchi  ABDOMEN: Soft, non-tender, non-distended MUSCULOSKELETAL:  No edema; No deformity  SKIN: Warm and dry NEUROLOGIC:  Alert and oriented x 3   EKG:      November 21, 2021: Sinus bradycardia 59.  No ST or T wave changes.  Recent Labs: 09/09/2021: ALT 28; BUN 11; Creat 0.86; Hemoglobin 14.4; Platelets 405; Potassium 4.6; Sodium 134    Lipid Panel    Component Value Date/Time   CHOL 215 (H) 08/01/2021 0000   CHOL 128 06/07/2019 0934   TRIG 125  08/01/2021 0000   HDL 68 08/01/2021 0000   HDL 52 06/07/2019 0934   CHOLHDL 3.2 08/01/2021 0000   VLDL 22 08/28/2016 1558   LDLCALC 123 (H) 08/01/2021 0000      Wt Readings from Last 3 Encounters:  11/21/21 172 lb 9.6 oz (78.3 kg)  11/13/21 174 lb 6.4 oz (79.1 kg)  08/20/21 171 lb 0.6 oz (77.6 kg)      Other studies Reviewed: Additional studies/ records that were reviewed today include: . Review of the above records demonstrates:    ASSESSMENT AND PLAN:  1. SVT -        Has had an event monitor in June, 2021.  baseline bradycardia Has seen Dr. Lovena Le of EP - she is on a high dose of metoprolol ( 300 mg a day _ which is controlling her SVT fairly well.  At some point, she may need an SVT ablation and then we could be able to reduce her dose of metoprolol  2.  Hypertension:    BP is well controlled   4. Minimal CAD by cath 2006 Mia [name]):      6. Dyspnea on exertion:    I suspect this is largely due to generalized deconditioning .  Have encouraged her to start walking as soon has her back is healed    Current medicines are reviewed at length with the patient today.  The patient does not have concerns regarding medicines.  The following changes have been made:  no change  Disposition:   FU with an APP in 1 year.    Mia Moores, MD  11/21/2021 9:26 AM     [address], [place], [place]  [address] Phone: [phone]; Fax: [phone]

## 2021-11-21 ENCOUNTER — Ambulatory Visit (INDEPENDENT_AMBULATORY_CARE_PROVIDER_SITE_OTHER): Payer: Medicare Other | Admitting: Cardiovascular Disease

## 2021-11-21 ENCOUNTER — Encounter: Payer: Self-pay | Admitting: Cardiovascular Disease

## 2021-11-21 VITALS — BP 124/72 | HR 59 | Ht 66.0 in | Wt 172.6 lb

## 2021-11-21 DIAGNOSIS — I10 Essential (primary) hypertension: Secondary | ICD-10-CM | POA: Diagnosis not present

## 2021-11-21 DIAGNOSIS — I471 Supraventricular tachycardia: Secondary | ICD-10-CM

## 2021-11-21 DIAGNOSIS — R0609 Other forms of dyspnea: Secondary | ICD-10-CM | POA: Diagnosis not present

## 2021-11-21 NOTE — Patient Instructions (Signed)
Medication Instructions:  Your physician recommends that you continue on your current medications as directed. Please refer to the Current Medication list given to you today.  *If you need a refill on your cardiac medications before your next appointment, please call your pharmacy*   Lab Work: None ordered  If you have labs (blood work) drawn today and your tests are completely normal, you will receive your results only by: [place] (if you have MyChart) OR A paper copy in the mail If you have any lab test that is abnormal or we need to change your treatment, we will call you to review the results.   Testing/Procedures: None ordered   Follow-Up: At [facility], you and your health needs are our priority.  As part of our continuing mission to provide you with exceptional heart care, we have created designated Provider Care Teams.  These Care Teams include your primary Cardiologist (physician) and Advanced Practice Providers (APPs -  Physician Assistants and Nurse Practitioners) who all work together to provide you with the care you need, when you need it.  We recommend signing up for the patient portal called "MyChart".  Sign up information is provided on this After Visit Summary.  MyChart is used to connect with patients for Virtual Visits (Telemedicine).  Patients are able to view lab/test results, encounter notes, upcoming appointments, etc.  Non-urgent messages can be sent to your provider as well.   To learn more about what you can do with MyChart, go to [email or web address].    Your next appointment:   12 month(s)  The format for your next appointment:   In Person  Provider:   Robbie Lis, PA-C, Christen Bame, NP, or Richardson Dopp, PA-C         Other Instructions

## 2021-11-21 NOTE — Progress Notes (Signed)
Agree with documentation as above.   Mia Dupuis, MD  

## 2021-11-21 NOTE — Progress Notes (Deleted)
Office Visit Note  Patient: Mia [name]             Date of Birth: [birth year]           MRN: [id number]             PCP: Hali Marry, MD Referring: Hali Marry, * Visit Date: 12/04/2021 Occupation: @GUAROCC @  Subjective:  No chief complaint on file.   History of Present Illness: Mia [name] is a 71 y.o. female ***   Activities of Daily Living:  Patient reports morning stiffness for *** {minute/hour:19697}.   Patient {ACTIONS;DENIES/REPORTS:21021675::"Denies"} nocturnal pain.  Difficulty dressing/grooming: {ACTIONS;DENIES/REPORTS:21021675::"Denies"} Difficulty climbing stairs: {ACTIONS;DENIES/REPORTS:21021675::"Denies"} Difficulty getting out of chair: {ACTIONS;DENIES/REPORTS:21021675::"Denies"} Difficulty using hands for taps, buttons, cutlery, and/or writing: {ACTIONS;DENIES/REPORTS:21021675::"Denies"}  No Rheumatology ROS completed.   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   Weakness of both lower extremities 06/11/2020   Hyponatremia 05/22/2020   Gait instability 06/20/2019   Facet arthritis of lumbar region 05/10/2018   MCI (mild cognitive impairment) 11/25/2017   Restrictive lung disease 07/30/2017   Mixed hyperlipidemia 06/18/2017   Rectocele 04/26/2017   Irritable bowel syndrome with both constipation and diarrhea 04/26/2017   Osteoporosis 04/18/2017   Trochanteric bursitis of both hips 02/24/2017   Stress fracture of left tibia 11/05/2016   Inflammatory arthritis 09/30/2016   High risk medication use 09/30/2016   History of Clostridium difficile colitis 09/30/2016   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   Pseudogout 09/30/2016   DDD cervical spine status post fusion 09/30/2016   DDD thoracic spine 09/30/2016   H/O total knee replacement, right 09/30/2016   Age-related osteoporosis without  current pathological fracture 09/30/2016   Tremor, essential 05/14/2016   Right foot pain 04/14/2016   B12 deficiency 09/10/2015   Syncope 09/10/2015   Cervical facet joint syndrome 09/10/2015   Chronic fatigue 09/10/2015   Aortic atherosclerosis ([place]) 04/01/2015   DOE (dyspnea on exertion)    Primary osteoarthritis of right knee 08/13/2014   Benign head tremor 06/11/2014   Lumbar degenerative disc disease 06/11/2014   IFG (impaired fasting glucose) 03/09/2014   Hemorrhoid 03/09/2014   Retinal wrinkling, right eye 03/09/2014   Fibromyalgia 03/09/2014   GERD (gastroesophageal reflux disease) 03/09/2014   History of arthroplasty of right knee 01/31/2014   Memory difficulty 12/14/2013   Hemorrhoids, external, thrombosed 06/22/2011   Hypertension 03/11/2011   SVT (supraventricular tachycardia) ([place])    Raynaud phenomenon    EDEMA 08/25/2010   Nonspecific (abnormal) findings on radiological and other examination of body structure 08/25/2010   COMPUTERIZED TOMOGRAPHY, CHEST, ABNORMAL 08/25/2010   OSA (obstructive sleep apnea) 04/24/2009   Allergic rhinitis 06/11/2008   Dyspnea 06/11/2008   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 difficile colitis    Chest pain    a. 01/2013 MV: EF 59%, no ischemia.   Chronic Dyspnea    a. 01/2013 Echo: EF 60-65%, Gr 1 DD, PASP 50mmHg.// Echo 01/2020: EF 60-65, no RWMA, GR 1 DD, GLS -21.6%, normal RV SF, trivial MR    COVID-19    DDD (degenerative disc disease), cervical    DDD (degenerative  disc disease), lumbar    Diabetes mellitus    Fibromyalgia    Gall stones    GERD (gastroesophageal reflux disease)    gastritis   Hemorrhoids    Hypertension    Interstitial cystitis    MCI (mild cognitive impairment) 11/25/2017    Memory difficulty 12/14/2013   Neuromuscular disorder (HCC)    sclerosis   Osteoporosis    Peptic ulcer    PONV (postoperative nausea and vomiting)    Pseudogout    Raynaud phenomenon    Rectal bleeding    Sleep apnea    a. on cpap.   SVT (supraventricular tachycardia) ([place])    Syncope 09/10/2015   Thyroid disease    hypothyroidism   Tremor, essential 05/14/2016    Family History  Problem Relation Age of Onset   Heart attack Mother    Stroke Mother    Diabetes Mother    Heart disease Mother    Colon polyps Mother    Asthma Mother    Dementia Mother    Heart disease Father    Heart attack Father    Asthma Father    Stroke Sister    Hypertension Sister    Rheum arthritis Sister    Dementia Sister    Asthma Sister    Lupus Sister    Asthma Sister    Breast cancer Maternal Grandmother    Wilson's disease Maternal Grandmother    Pancreatic cancer Maternal Grandfather    Past Surgical History:  Procedure Laterality Date   APPENDECTOMY     BLADDER SURGERY     x2   BLADDER SURGERY     BREAST BIOPSY     CARDIAC CATHETERIZATION     CARPAL TUNNEL RELEASE     CATARACT EXTRACTION Bilateral    CHOLECYSTECTOMY     DILATION AND CURETTAGE OF UTERUS     ENTEROCELE REPAIR     x2   EYE SURGERY     retina   hysterectomy - unknown type     JOINT REPLACEMENT     KNEE ARTHROPLASTY     KNEE SURGERY     x6   NECK SURGERY     fusion   OOPHORECTOMY     QUADRICEPS REPAIR Right    RECTOCELE REPAIR     x2   SHOULDER ARTHROSCOPY WITH SUBACROMIAL DECOMPRESSION, ROTATOR CUFF REPAIR AND BICEP TENDON REPAIR  10/06/2012   Procedure: SHOULDER ARTHROSCOPY WITH SUBACROMIAL DECOMPRESSION, ROTATOR CUFF REPAIR AND BICEP TENDON REPAIR;  Surgeon: Nita Sells, MD;  Location: [facility];  Service: Orthopedics;  Laterality: Right;  Arthroscopic  Repair  of  Subscapularis, Open Biceps Tenodesis   SHOULDER SURGERY     bilateral- bones spur   SHOULDER SURGERY Left  04/15/2021   TONSILLECTOMY     TOTAL KNEE ARTHROPLASTY     TOTAL SHOULDER ARTHROPLASTY     Social History   Social History Narrative   Lives with her husband. She likes to hang out with her friends and play games and do bible study.   Immunization History  Administered Date(s) Administered   Fluad Quad(high Dose 65+) 07/13/2019, 07/10/2020, 07/22/2021   Hepatitis B, adult 02/25/2021, 03/28/2021, 08/26/2021   Influenza Split 07/26/2012, 06/11/2014, 07/25/2015, 06/23/2018   Influenza Whole 07/30/2009, 08/19/2010   Influenza, High Dose Seasonal PF 07/06/2016, 07/05/2017   Influenza,inj,Quad PF,6+ Mos 06/11/2014, 07/25/2015, 06/23/2018   Influenza-Unspecified 07/26/2013, 09/05/2013, 07/13/2019, 07/10/2020, 07/22/2021   Moderna Sars-Covid-2 Vaccination 02/29/2020, 03/28/2020, 10/04/2020   Pneumococcal Conjugate-13 01/04/2017  Pneumococcal Polysaccharide-23 07/30/2009, 11/14/2018   Pneumococcal-Unspecified 10/27/2015   Tdap 02/13/2008, 05/12/2018   Zoster, Live 09/01/2011     Objective: Vital Signs: There were no vitals taken for this visit.   Physical Exam   Musculoskeletal Exam: ***  CDAI Exam: CDAI Score: -- Patient Global: --; Provider Global: -- Swollen: --; Tender: -- Joint Exam 12/04/2021   No joint exam has been documented for this visit   There is currently no information documented on the homunculus. Go to the Rheumatology activity and complete the homunculus joint exam.  Investigation: No additional findings.  Imaging: No results found.  Recent Labs: Lab Results  Component Value Date   WBC 5.7 09/09/2021   HGB 14.4 09/09/2021   PLT 405 (H) 09/09/2021   NA 134 (L) 09/09/2021   K 4.6 09/09/2021   CL 98 09/09/2021   CO2 30 09/09/2021   GLUCOSE 69 09/09/2021   BUN 11 09/09/2021   CREATININE 0.86 09/09/2021   BILITOT 0.4 09/09/2021   ALKPHOS 73 05/21/2020   AST 29 09/09/2021   ALT 28 09/09/2021   PROT 7.0 09/09/2021   ALBUMIN 4.0 05/21/2020    CALCIUM 10.0 09/09/2021   GFRAA 94 03/10/2021    Speciality Comments: No specialty comments available.  Procedures:  No procedures performed Allergies: Codeine, Myrbetriq  [mirabegron], Nystatin, Percocet [oxycodone-acetaminophen], Propoxyphene, Celebrex [celecoxib], Darvocet [propoxyphene n-acetaminophen], Erythromycin, Hydrocodone, Lyrica [pregabalin], Macrodantin [nitrofurantoin], Toradol [ketorolac tromethamine], Tramadol, and Verapamil   Assessment / Plan:     Visit Diagnoses: Rheumatoid arthritis of multiple sites with negative rheumatoid factor (HCC)  High risk medication use  Raynaud's phenomenon without gangrene  Elevated LFTs  Primary osteoarthritis of left knee  H/O total knee replacement, right  Pseudogout  DDD (degenerative disc disease), cervical  DDD (degenerative disc disease), thoracic  DDD (degenerative disc disease), lumbar  Fibromyalgia  Other osteoporosis without current pathological fracture  S/P carpal tunnel release  Chronic fatigue  History of hypertension  History of gastroesophageal reflux (GERD)  History of CHF (congestive heart failure)  IC (interstitial cystitis)  History of Clostridium difficile colitis  Orders: No orders of the defined types were placed in this encounter.  No orders of the defined types were placed in this encounter.   Face-to-face time spent with patient was *** minutes. Greater than 50% of time was spent in counseling and coordination of care.  Follow-Up Instructions: No follow-ups on file.   [name], PA-C  Note - This record has been created using Dragon software.  Chart creation errors have been sought, but may not always  have been located. Such creation errors do not reflect on  the standard of medical care.

## 2021-11-24 ENCOUNTER — Telehealth: Payer: Self-pay | Admitting: Emergency Medicine

## 2021-11-24 NOTE — Telephone Encounter (Signed)
Spoke to [place] with [facility]. Mia [name] is requesting last OV note and PFT to be faxed to  [phone]. Requested records have been faxed.  Nothing further needed.

## 2021-11-25 DIAGNOSIS — M5416 Radiculopathy, lumbar region: Secondary | ICD-10-CM | POA: Diagnosis not present

## 2021-11-25 DIAGNOSIS — M5116 Intervertebral disc disorders with radiculopathy, lumbar region: Secondary | ICD-10-CM | POA: Diagnosis not present

## 2021-11-25 DIAGNOSIS — M5126 Other intervertebral disc displacement, lumbar region: Secondary | ICD-10-CM | POA: Diagnosis not present

## 2021-11-25 HISTORY — PX: OTHER SURGICAL HISTORY: SHX169

## 2021-11-29 ENCOUNTER — Encounter

## 2021-11-30 ENCOUNTER — Encounter

## 2021-12-01 ENCOUNTER — Encounter

## 2021-12-02 ENCOUNTER — Ambulatory Visit: Payer: Medicare Other | Admitting: Adult Health

## 2021-12-02 ENCOUNTER — Encounter

## 2021-12-04 ENCOUNTER — Ambulatory Visit: Payer: Medicare Other | Admitting: Rheumatology

## 2021-12-04 DIAGNOSIS — M797 Fibromyalgia: Secondary | ICD-10-CM

## 2021-12-04 DIAGNOSIS — R7989 Other specified abnormal findings of blood chemistry: Secondary | ICD-10-CM

## 2021-12-04 DIAGNOSIS — M5136 Other intervertebral disc degeneration, lumbar region: Secondary | ICD-10-CM

## 2021-12-04 DIAGNOSIS — I7300 Raynaud's syndrome without gangrene: Secondary | ICD-10-CM

## 2021-12-04 DIAGNOSIS — Z79899 Other long term (current) drug therapy: Secondary | ICD-10-CM

## 2021-12-04 DIAGNOSIS — Z8679 Personal history of other diseases of the circulatory system: Secondary | ICD-10-CM

## 2021-12-04 DIAGNOSIS — M0609 Rheumatoid arthritis without rheumatoid factor, multiple sites: Secondary | ICD-10-CM

## 2021-12-04 DIAGNOSIS — M5134 Other intervertebral disc degeneration, thoracic region: Secondary | ICD-10-CM

## 2021-12-04 DIAGNOSIS — M1712 Unilateral primary osteoarthritis, left knee: Secondary | ICD-10-CM

## 2021-12-04 DIAGNOSIS — M1120 Other chondrocalcinosis, unspecified site: Secondary | ICD-10-CM

## 2021-12-04 DIAGNOSIS — M818 Other osteoporosis without current pathological fracture: Secondary | ICD-10-CM

## 2021-12-04 DIAGNOSIS — N3010 Interstitial cystitis (chronic) without hematuria: Secondary | ICD-10-CM

## 2021-12-04 DIAGNOSIS — Z96651 Presence of right artificial knee joint: Secondary | ICD-10-CM

## 2021-12-04 DIAGNOSIS — Z8719 Personal history of other diseases of the digestive system: Secondary | ICD-10-CM

## 2021-12-04 DIAGNOSIS — Z9889 Other specified postprocedural states: Secondary | ICD-10-CM

## 2021-12-04 DIAGNOSIS — R5382 Chronic fatigue, unspecified: Secondary | ICD-10-CM

## 2021-12-04 DIAGNOSIS — M5030 Other cervical disc degeneration, unspecified cervical region: Secondary | ICD-10-CM

## 2021-12-04 DIAGNOSIS — Z8619 Personal history of other infectious and parasitic diseases: Secondary | ICD-10-CM

## 2021-12-05 NOTE — Progress Notes (Signed)
Office Visit Note  Patient: Mia [name]             Date of Birth: 1950/12/31           MRN: [id number]             PCP: Hali Marry, MD Referring: Hali Marry, * Visit Date: 12/18/2021 Occupation: @GUAROCC @  Subjective:  Pain in multiple joints   History of Present Illness: Mia [name] is a 71 y.o. female with history of seronegative rheumatoid arthritis, pseudogout, osteoarthritis, and DDD.  Patient remains on Arava 20 mg 1 tablet by mouth daily.  She is tolerating Arava without any side effects and has not missed any doses recently.  She denies any recent rheumatoid arthritis flares. She states that she underwent a lateral lumbar microdiscectomy at L4-L5 by Dr. Lurene Shadow on 11/25/2021.  Overall her symptoms have improved significantly.  She is unable to do any bending lifting or twisting at this time.  She states that she was evaluated by Dr. Norwood Levo on 12/15/2021 for ongoing pain in her left shoulder.  She had a left shoulder joint cortisone injection at that time which is started to alleviate her discomfort. Patient reports that she has intermittent stiffness in both hands but denies any joint swelling.  She has noticed some decreased grip strength.  She also has intermittent symptoms of Raynaud's but denies any digital ulcerations or signs of gangrene.  She continues to experience intermittent myalgias and muscle tenderness due to fibromyalgia.  She is working on reducing the dose of gabapentin and has been taking Tylenol very sparingly for pain relief. Patient reports that she had a recent appointment with her endocrinologist Dr. Kelton Pillar who has recommended switching from Prolia to Reclast since she has been on Prolia for 5 years.  She denies any recent falls or fractures.  She has not had any recent infections.    Activities of Daily Living:  Patient reports morning stiffness for  30 minutes.   Patient Denies nocturnal pain.  Difficulty dressing/grooming:  Denies Difficulty climbing stairs: Reports Difficulty getting out of chair: Reports Difficulty using hands for taps, buttons, cutlery, and/or writing: Denies  Review of Systems  Constitutional:  Positive for fatigue.  HENT:  Positive for mouth dryness. Negative for mouth sores and nose dryness.   Eyes:  Positive for dryness. Negative for pain and itching.  Respiratory:  Positive for shortness of breath. Negative for difficulty breathing.   Cardiovascular:  Negative for chest pain and palpitations.  Gastrointestinal:  Negative for blood in stool, constipation and diarrhea.  Endocrine: Negative for increased urination.  Genitourinary:  Negative for difficulty urinating.  Musculoskeletal:  Positive for joint pain, joint pain, myalgias, morning stiffness, muscle tenderness and myalgias. Negative for joint swelling.  Skin:  Positive for rash. Negative for color change.  Allergic/Immunologic: Negative for susceptible to infections.  Neurological:  Positive for memory loss. Negative for dizziness, numbness and headaches.  Hematological:  Negative for bruising/bleeding tendency.  Psychiatric/Behavioral:  Positive for confusion.    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   Weakness of both lower extremities 06/11/2020   Hyponatremia 05/22/2020   Gait instability 06/20/2019   Facet arthritis of lumbar region 05/10/2018   MCI (mild cognitive impairment) 11/25/2017   Restrictive lung disease 07/30/2017   Mixed hyperlipidemia 06/18/2017   Rectocele 04/26/2017  Irritable bowel syndrome with both constipation and diarrhea 04/26/2017   Osteoporosis 04/18/2017   Trochanteric bursitis of both hips 02/24/2017   Stress fracture of left tibia 11/05/2016   Inflammatory arthritis 09/30/2016   High risk medication use 09/30/2016   History of Clostridium  difficile colitis 09/30/2016   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   Pseudogout 09/30/2016   DDD cervical spine status post fusion 09/30/2016   DDD thoracic spine 09/30/2016   H/O total knee replacement, right 09/30/2016   Age-related osteoporosis without current pathological fracture 09/30/2016   Tremor, essential 05/14/2016   Right foot pain 04/14/2016   B12 deficiency 09/10/2015   Syncope 09/10/2015   Cervical facet joint syndrome 09/10/2015   Chronic fatigue 09/10/2015   Aortic atherosclerosis ([place]) 04/01/2015   DOE (dyspnea on exertion)    Primary osteoarthritis of right knee 08/13/2014   Benign head tremor 06/11/2014   Lumbar degenerative disc disease 06/11/2014   IFG (impaired fasting glucose) 03/09/2014   Hemorrhoid 03/09/2014   Retinal wrinkling, right eye 03/09/2014   Fibromyalgia 03/09/2014   GERD (gastroesophageal reflux disease) 03/09/2014   History of arthroplasty of right knee 01/31/2014   Memory difficulty 12/14/2013   Hemorrhoids, external, thrombosed 06/22/2011   Hypertension 03/11/2011   SVT (supraventricular tachycardia) ([place])    Raynaud phenomenon    EDEMA 08/25/2010   Nonspecific (abnormal) findings on radiological and other examination of body structure 08/25/2010   COMPUTERIZED TOMOGRAPHY, CHEST, ABNORMAL 08/25/2010   OSA (obstructive sleep apnea) 04/24/2009   Allergic rhinitis 06/11/2008   Dyspnea 06/11/2008   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   Chronic diastolic CHF (congestive heart failure) ([place]) 06/08/2008   INTERSTITIAL CYSTITIS 06/08/2008    Past Medical History:  Diagnosis Date   Anal fissure    Anemia    Arthritis    Blood transfusion    C. difficile colitis    Chest pain    a. 01/2013 MV: EF 59%, no ischemia.   Chronic Dyspnea    a. 01/2013 Echo: EF 60-65%, Gr 1 DD, PASP 39mmHg.// Echo 01/2020: EF 60-65, no RWMA, GR 1 DD, GLS -21.6%, normal RV SF, trivial MR    COVID-19    DDD (degenerative disc disease),  cervical    DDD (degenerative disc disease), lumbar    Diabetes mellitus    Fibromyalgia    Gall stones    GERD (gastroesophageal reflux disease)    gastritis   Hemorrhoids    Hypertension    Interstitial cystitis    MCI (mild cognitive impairment) 11/25/2017   Memory difficulty 12/14/2013   Neuromuscular disorder (HCC)    sclerosis   Osteoporosis    Peptic ulcer    PONV (postoperative nausea and vomiting)    Pseudogout    Raynaud phenomenon    Rectal bleeding    Sleep apnea    a. on cpap.   SVT (supraventricular tachycardia) (H[facility])    Syncope 09/10/2015   Thyroid disease    hypothyroidism   Tremor, essential 05/14/2016    Family History  Problem Relation Age of Onset   Heart attack Mother    Stroke Mother    Diabetes Mother    Heart disease Mother    Colon polyps Mother    Asthma Mother    Dementia Mother    Heart disease Father    Heart attack Father    Asthma Father    Stroke Sister    Hypertension Sister    Rheum arthritis Sister    Dementia Sister    Asthma  Sister    Lupus Sister    Asthma Sister    Breast cancer Maternal Grandmother    Wilson's disease Maternal Grandmother    Pancreatic cancer Maternal Grandfather    Past Surgical History:  Procedure Laterality Date   APPENDECTOMY     BLADDER SURGERY     x2   BLADDER SURGERY     BREAST BIOPSY     CARDIAC CATHETERIZATION     CARPAL TUNNEL RELEASE     CATARACT EXTRACTION Bilateral    CHOLECYSTECTOMY     DILATION AND CURETTAGE OF UTERUS     ENTEROCELE REPAIR     x2   EYE SURGERY     retina   herniated disc  11/25/2021   L4 and L5   hysterectomy - unknown type     JOINT REPLACEMENT     KNEE ARTHROPLASTY     KNEE SURGERY     x6   NECK SURGERY     fusion   OOPHORECTOMY     QUADRICEPS REPAIR Right    RECTOCELE REPAIR     x2   SHOULDER ARTHROSCOPY WITH SUBACROMIAL DECOMPRESSION, ROTATOR CUFF REPAIR AND BICEP TENDON REPAIR  10/06/2012   Procedure: SHOULDER ARTHROSCOPY WITH SUBACROMIAL  DECOMPRESSION, ROTATOR CUFF REPAIR AND BICEP TENDON REPAIR;  Surgeon: Nita Sells, MD;  Location: [place];  Service: Orthopedics;  Laterality: Right;  Arthroscopic  Repair  of  Subscapularis, Open Biceps Tenodesis   SHOULDER SURGERY     bilateral- bones spur   SHOULDER SURGERY Left 04/15/2021   TONSILLECTOMY     TOTAL KNEE ARTHROPLASTY     TOTAL SHOULDER ARTHROPLASTY     Social History   Social History Narrative   Lives with her husband. She likes to hang out with her friends and play games and do bible study.   Immunization History  Administered Date(s) Administered   Fluad Quad(high Dose 65+) 07/13/2019, 07/10/2020, 07/22/2021   Hepatitis B, adult 02/25/2021, 03/28/2021, 08/26/2021   Influenza Split 07/26/2012, 06/11/2014, 07/25/2015, 06/23/2018   Influenza Whole 07/30/2009, 08/19/2010   Influenza, High Dose Seasonal PF 07/06/2016, 07/05/2017   Influenza,inj,Quad PF,6+ Mos 06/11/2014, 07/25/2015, 06/23/2018   Influenza-Unspecified 07/26/2013, 09/05/2013, 07/13/2019, 07/10/2020, 07/22/2021   Moderna Sars-Covid-2 Vaccination 02/29/2020, 03/28/2020, 10/04/2020   Pneumococcal Conjugate-13 01/04/2017   Pneumococcal Polysaccharide-23 07/30/2009, 11/14/2018   Pneumococcal-Unspecified 10/27/2015   Tdap 02/13/2008, 05/12/2018   Zoster, Live 09/01/2011     Objective: Vital Signs: BP (!) 145/89 (BP Location: Left Arm, Patient Position: Sitting, Cuff Size: Normal)    Pulse (!) 55    Ht 5\' 6"  (1.676 m)    Wt 171 lb 9.6 oz (77.8 kg)    BMI 27.70 kg/m    Physical Exam Vitals and nursing note reviewed.  Constitutional:      Appearance: She is well-developed.  HENT:     Head: Normocephalic and atraumatic.  Eyes:     Conjunctiva/sclera: Conjunctivae normal.  Cardiovascular:     Rate and Rhythm: Normal rate and regular rhythm.     Heart sounds: Normal heart sounds.  Pulmonary:     Effort: Pulmonary effort is normal.     Breath sounds: Normal breath sounds.   Abdominal:     General: Bowel sounds are normal.     Palpations: Abdomen is soft.  Musculoskeletal:     Cervical back: Normal range of motion.  Lymphadenopathy:     Cervical: No cervical adenopathy.  Skin:    General: Skin is warm and dry.  Capillary Refill: Capillary refill takes less than 2 seconds.  Neurological:     Mental Status: She is alert and oriented to person, place, and time.  Psychiatric:        Behavior: Behavior normal.     Musculoskeletal Exam: Limited ROM of the C-spine.  Both shoulders have limited abduction to about 120 degrees.  Tenderness over the medial epicondyle of both elbows.  Wrist joints have good ROM with no tenderness or synovitis. PIP and DIP thickening consistent with osteoarthritis of both hands.  Hip joints difficult to assess in seated position.  Right knee replacement has good ROM.  Painful ROM of the left knee joint but no warmth or effusion.  Ankle joints have good ROM with no tenderness or joint swelling.   CDAI Exam: CDAI Score: 0.4  Patient Global: 2 mm; Provider Global: 2 mm Swollen: 0 ; Tender: 1  Joint Exam 12/18/2021      Right  Left  Acromioclavicular      Tender   There is currently no information documented on the homunculus. Go to the Rheumatology activity and complete the homunculus joint exam.  Investigation: No additional findings.  Imaging: No results found.  Recent Labs: Lab Results  Component Value Date   WBC 5.7 09/09/2021   HGB 14.4 09/09/2021   PLT 405 (H) 09/09/2021   NA 137 12/15/2021   K 4.2 12/15/2021   CL 102 12/15/2021   CO2 32 12/15/2021   GLUCOSE 96 12/15/2021   BUN 9 12/15/2021   CREATININE 0.80 12/15/2021   BILITOT 0.4 09/09/2021   ALKPHOS 73 05/21/2020   AST 29 09/09/2021   ALT 28 09/09/2021   PROT 7.0 09/09/2021   ALBUMIN 3.8 12/15/2021   CALCIUM 9.7 12/15/2021   GFRAA 94 03/10/2021    Speciality Comments: No specialty comments available.  Procedures:  No procedures  performed Allergies: Codeine, Myrbetriq  [mirabegron], Nystatin, Percocet [oxycodone-acetaminophen], Propoxyphene, Celebrex [celecoxib], Darvocet [propoxyphene n-acetaminophen], Erythromycin, Hydrocodone, Lyrica [pregabalin], Macrodantin [nitrofurantoin], Toradol [ketorolac tromethamine], Tramadol, and Verapamil   Assessment / Plan:     Visit Diagnoses: Seronegative rheumatoid arthritis ([place]): She has no synovitis on examination today.  She has not had any clinical features of a rheumatoid arthritis flare recently.  She is clinically doing well taking Arava 20 mg 1 tablet by mouth daily.  She continues to tolerate her Rey[place]ow without any side effects and has not missed any doses recently.  She has not had any recent infections.  She experiences intermittent stiffness in both hands due to underlying osteoarthritis.  She is also noticed some decreased grip strength.  Discussed the importance of joint protection and muscle strengthening.  She will remain on her Reyvow as monotherapy.  She was advised to notify us if she develops increased joint pain or joint swelling.  She will follow-up in the office in 5 months.  High risk medication use - Arava 20 mg 1 tablet by mouth daily. BMP updated on 12/15/21.  CBC and hepatic function panel will be updated today.  She will continue to require lab work every 3 months to monitor for drug toxicity.- Plan: CBC with Differential/Platelet, Hepatic function panel She has not had any recent infections. Discussed the importance of holding [place] if she develops signs or symptoms of infection and to resume once infection is completely cleared.  Raynaud's phenomenon without gangrene: She experiences intermittent symptoms of Raynaud's.  No signs of sclerodactyly were noted.  No signs of digital ulcerations or gangrene were noted.  Good capillary  refill noted.  Elevated LFTs - Followed by gastroenterology.  AST was 29 and ALT was 28 on 09/09/2021 hepatic function panel will be  rechecked today.- Plan: Hepatic function panel  Primary osteoarthritis of left knee: She experiences intermittent pain and stiffness in the left knee joint.  On examination today she has good range of motion of the left knee with no warmth or effusion.  H/O total knee replacement, right: Doing well.  She has good range of motion of the right knee replacement with no discomfort at this time.  Pseudogout: She has not had any recent pseudogout flares.  DDD (degenerative disc disease), cervical: She has limited range of motion of the C-spine with some discomfort.  S/P arthroscopy of left shoulder - Performed by Dr. Norwood Levo on 04/15/2021.  She had an incomplete rotator cuff tear.  She recently had increased discomfort in the left shoulder joint and was reevaluated by Dr. Norwood Levo on 12/15/2021 at which time she had a left shoulder cortisone injection which has started to alleviate her discomfort.  DDD (degenerative disc disease), thoracic: No midline spinal tenderness was noted.  DDD (degenerative disc disease), lumbar - She is followed by Dr. Lurene Shadow.  She underwent a lateral lumbar microdiscectomy left L4-L5 on 11/25/2021.  She is currently restricted with her activity level and is unable to bend, lift, or twist.  Fibromyalgia: She experiences intermittent myalgias and muscle tenderness due to fibromyalgia.  She has positive tender points on examination today.  Other osteoporosis without current pathological fracture - DEXA on 06/07/19 right femoral neck BMD 0.564 with T-score -2.6, +6%.  History of multiple stress fractures.  Followed by Dr. Malachy Mood.  She had a follow-up appointment scheduled on 12/15/2021 and the office visit note was reviewed today in the office.  Previous therapies include alendronate in 2016 followed by Prolia in 2018.  The plan is to switch the patient to IV Reclast x1 dose.  History of foot fracture - She has been following up closely with Dr. Doran Durand for a closed  fracture of the fifth metatarsal.  She had delayed healing but did not proceed with surgical repair per patient.  She wears proper fitting shoes.  S/P carpal tunnel release - Performed by Dr. Grandville Silos.  Carpal tunnel syndrome, right upper limb - NCV with EMG on 08/19/2017 moderate right median nerve entrapment at the wrist affecting sensory and motor components.  Asymptomatic at this time  Other medical conditions are listed as follows:  IC (interstitial cystitis)  History of Clostridium difficile colitis  History of hypertension  Chronic fatigue  History of CHF (congestive heart failure)  History of gastroesophageal reflux (GERD)  Orders: Orders Placed This Encounter  Procedures   CBC with Differential/Platelet   Hepatic function panel   No orders of the defined types were placed in this encounter.   Follow-Up Instructions: Return in about 5 months (around 05/17/2022) for Rheumatoid arthritis, Osteoarthritis, DDD.   [name], PA-C  Note - This record has been created using Dragon software.  Chart creation errors have been sought, but may not always  have been located. Such creation errors do not reflect on  the standard of medical care.

## 2021-12-11 ENCOUNTER — Other Ambulatory Visit: Payer: Self-pay

## 2021-12-11 ENCOUNTER — Ambulatory Visit (INDEPENDENT_AMBULATORY_CARE_PROVIDER_SITE_OTHER): Payer: Medicare Other | Admitting: Emergency Medicine

## 2021-12-11 ENCOUNTER — Encounter: Payer: Self-pay | Admitting: Emergency Medicine

## 2021-12-11 VITALS — BP 122/76 | HR 59 | Temp 97.8°F | Ht 66.0 in | Wt 174.2 lb

## 2021-12-11 DIAGNOSIS — R0602 Shortness of breath: Secondary | ICD-10-CM | POA: Diagnosis not present

## 2021-12-11 DIAGNOSIS — G4733 Obstructive sleep apnea (adult) (pediatric): Secondary | ICD-10-CM | POA: Diagnosis not present

## 2021-12-11 NOTE — Progress Notes (Signed)
History of Present Illness:  71 yo woman never smoker with hx paroxismal SVT, Holter showed correlation at the time with exertional dyspnea and lightheadedness. PFT, methacholine and CPEX were reassuring except exercise limited by tachycardia. Underwent CPAP titration and needed 14cm H2O pressure   ROV 12/11/2021 --follow-up visit 71 year old woman whom I have followed for shortness of breath and obstructive sleep apnea for which she is on CPAP.  PMH significant for PSVT.  Her pulmonary function testing, methacholine and CPX have all been reassuring except for tachycardia. Today she reports that she has good compliance w CPAP, has been dealing w dryness even though she is using humidity. Using full face mask. Good compliance, wears every night. She believes she gets benefit > no HA in am, less daytime sleepiness. Her device is making a weird noise when it is running. She is wondering about a hypoglossal nerve stimulator, whether this could be an option for her.  Her functional capacity is limited by dyspnea - walking, even showering sometimes. She had an episode that was was treated as asthmatic bronchitis a few month ago in setting of URI. She was treated transiently w Advair and albuterol - did feel that it helped her.   CT-PA done 02/22/2020 reviewed by me shows no evidence of PE.  Some mild dependent atelectasis without any other significant abnormality.  No mediastinal or hilar adenopathy.  Echocardiogram 03/04/2020 reviewed, showed normal LVEF 60-65% with mild LV hypertrophy and normal diastolic function.  The RV is normal size and function.  PASP normal.     Vitals:   12/11/21 0930  BP: 122/76  Pulse: (!) 59  Temp: 97.8 F (36.6 C)  TempSrc: Oral  SpO2: 96%  Weight: 174 lb 3.2 oz (79 kg)  Height: 5\' 6"  (1.676 m)    Gen: Pleasant, well-nourished, in no distress,  normal affect  ENT: No lesions,  mouth clear,  oropharynx clear, no postnasal drip  Neck: No JVD, no stridor  Lungs:  No use of accessory muscles, clear without rales or rhonch.  Cardiovascular: RRR, heart sounds normal, no murmur or gallops, no peripheral edema  Musculoskeletal: No deformities, no cyanosis or clubbing  Neuro: alert, non focal  Skin: Warm, no lesions or rashes     OSA (obstructive sleep apnea) Good compliance with CPAP.  She is getting clinical benefit.  Her device is old and probably will need to be replaced soon.  She is also wondering whether she may qualify for, benefit from hypoglossal stimulator device.  I think we should repeat her sleep study and then consider referral to sleep medicine to discuss those options further depending on the results.   Please continue to use your CPAP every night as you have been using it.  We confirmed good compliance and clinical benefit today. We will arrange for a repeat sleep study Depending on results we will decide whether to discuss options for sleep apnea treatment with Sleep Medicine, including possible hypoglossal nerve stimulator Follow Dr. Lamonte Sakai in 6 to 8 weeks to review your sleep study.  Dyspnea Her pulmonary function testing has not supported obstructive lung disease but she does still intermittently feel that she gets benefit from albuterol.  Interesting she also has asthma-like symptoms and flares intermittently, often associated with URI.  For now she should keep albuterol available.  I do not think she needs to be on scheduled bronchodilator therapy.  We may decide to repeat her pulmonary function testing at some point going forward to revisit any evidence for  obstruction.  Baltazar Apo, MD, PhD 12/11/2021, 12:35 PM  Pulmonary and Critical Care [phone] or if no answer [phone]

## 2021-12-11 NOTE — Assessment & Plan Note (Signed)
Her pulmonary function testing has not supported obstructive lung disease but she does still intermittently feel that she gets benefit from albuterol.  Interesting she also has asthma-like symptoms and flares intermittently, often associated with URI.  For now she should keep albuterol available.  I do not think she needs to be on scheduled bronchodilator therapy.  We may decide to repeat her pulmonary function testing at some point going forward to revisit any evidence for obstruction.

## 2021-12-11 NOTE — Assessment & Plan Note (Signed)
Good compliance with CPAP.  She is getting clinical benefit.  Her device is old and probably will need to be replaced soon.  She is also wondering whether she may qualify for, benefit from hypoglossal stimulator device.  I think we should repeat her sleep study and then consider referral to sleep medicine to discuss those options further depending on the results.   Please continue to use your CPAP every night as you have been using it.  We confirmed good compliance and clinical benefit today. We will arrange for a repeat sleep study Depending on results we will decide whether to discuss options for sleep apnea treatment with Sleep Medicine, including possible hypoglossal nerve stimulator Follow Dr. Lamonte Sakai in 6 to 8 weeks to review your sleep study.

## 2021-12-11 NOTE — Patient Instructions (Signed)
Please continue to use your CPAP every night as you have been using it.  We confirmed good compliance and clinical benefit today. We will arrange for a repeat sleep study Depending on results we will decide whether to discuss options for sleep apnea treatment with Sleep Medicine, including possible hypoglossal nerve stimulator Okay for you to use albuterol 2 puffs if you need it for shortness of breath, chest tightness, wheezing.  Keep track of whether this medication helps you so we can discuss when you follow-up. Work on your exercise and conditioning.  This will probably help your breathing and functional capacity. Follow Dr. Lamonte Sakai in 6 to 8 weeks to review your sleep study.

## 2021-12-12 DIAGNOSIS — M79605 Pain in left leg: Secondary | ICD-10-CM | POA: Diagnosis not present

## 2021-12-15 ENCOUNTER — Other Ambulatory Visit (INDEPENDENT_AMBULATORY_CARE_PROVIDER_SITE_OTHER): Payer: Medicare Other

## 2021-12-15 ENCOUNTER — Other Ambulatory Visit: Payer: Self-pay

## 2021-12-15 ENCOUNTER — Ambulatory Visit (INDEPENDENT_AMBULATORY_CARE_PROVIDER_SITE_OTHER): Payer: Medicare Other | Admitting: Internal Medicine

## 2021-12-15 ENCOUNTER — Encounter: Payer: Self-pay | Admitting: Internal Medicine

## 2021-12-15 VITALS — BP 118/74 | HR 64 | Ht 66.0 in | Wt 174.0 lb

## 2021-12-15 DIAGNOSIS — M810 Age-related osteoporosis without current pathological fracture: Secondary | ICD-10-CM

## 2021-12-15 DIAGNOSIS — M816 Localized osteoporosis [Lequesne]: Secondary | ICD-10-CM | POA: Diagnosis not present

## 2021-12-15 DIAGNOSIS — M25512 Pain in left shoulder: Secondary | ICD-10-CM | POA: Diagnosis not present

## 2021-12-15 LAB — BASIC METABOLIC PANEL
BUN: 9 mg/dL (ref 6–23)
CO2: 32 mEq/L (ref 19–32)
Calcium: 9.7 mg/dL (ref 8.4–10.5)
Chloride: 102 mEq/L (ref 96–112)
Creatinine, Ser: 0.8 mg/dL (ref 0.40–1.20)
GFR: 74.72 mL/min (ref >60.00)
Glucose, Bld: 96 mg/dL (ref 70–99)
Potassium: 4.2 mEq/L (ref 3.5–5.1)
Sodium: 137 mEq/L (ref 135–145)

## 2021-12-15 LAB — TSH: TSH: 0.97 u[IU]/mL (ref 0.35–5.50)

## 2021-12-15 LAB — VITAMIN D 25 HYDROXY (VIT D DEFICIENCY, FRACTURES): VITD: 33.06 ng/mL (ref 30.00–100.00)

## 2021-12-15 LAB — MAGNESIUM: Magnesium: 2 mg/dL (ref 1.5–2.5)

## 2021-12-15 LAB — PHOSPHORUS: Phosphorus: 3.2 mg/dL (ref 2.3–4.6)

## 2021-12-15 LAB — ALBUMIN: Albumin: 3.8 g/dL (ref 3.5–5.2)

## 2021-12-15 NOTE — Progress Notes (Signed)
Name: Mia [name]  MRN/ DOB: [id number], January 20, 1951    Age/ Sex: 71 y.o., female     PCP: Hali Marry, MD   Reason for Endocrinology Evaluation: Hyponatremia      Initial Endocrinology Clinic Visit: 05/21/2020    PATIENT IDENTIFIER: Mia [name] is a 71 y.o., female with a past medical history of emphysema, OSA on CPAP , SVT and Raynaud's phenomenon, RA ,and memory loss . She has followed with [facility] since 05/21/2020 for consultative assistance with management of her Hyponatremia       HISTORICAL SUMMARY:  Pt has been noted with hyponatremia in 02/2020 with a nadir of 129 mmol/L.  She was consuming ~ 60-80 oz of crushed ice as well as ~ 40 oz of fluid a day. She was on HCTZ at the time, which we stopped.    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   She was drinking ~160 oz of fluids a day in addition to being on HCTZ. Sodium normalized with fluid reduction and stopping HCTZ.   Patient returned to endocrinology clinic for consultation osteoporosis in 12/15/2021   OSTEOPOROSIS HISTORY:  Pt was diagnosed with osteoporosis:2020  Menarche at age : 2 Menopausal at age : Hysterectomy at age 29 due to bleeding . Oophorectomy at age 35 Fracture Hx: Probable right third metatarsal stress fracture 10/2020 (status post fall )left foot "jones fracture " Hx of HRT: Yes, for years  FH of osteoporosis or hip fracture:  sister  Prior Hx of anti-estrogenic therapy :no Prior Hx of anti-resorptive therapy : Alendronate in 2016 switched to  Prolia in 08/2017   SUBJECTIVE:    Today (12/15/2021):  Ms. Iannello is here for a follow up on hyponatremia. She is accompanied by her husband today.   She is here for a osteoporosis.  She had a stress fracture requiring boot  Had left shoulder sx in 03/2021  Had back sx 2 weeks , requiring glucocorticoids . Prior to that had 2 courses of prednisone for bronchitis   Has hx of multiple stress fractures    She is not on calcium and Vitamin D     HISTORY:  Past Medical History:  Past Medical History:  Diagnosis Date   Anal fissure    Anemia    Arthritis    Blood transfusion    C. difficile colitis    Chest pain    a. 01/2013 MV: EF 59%, no ischemia.   Chronic Dyspnea    a. 01/2013 Echo: EF 60-65%, Gr 1 DD, PASP 75mmHg.// Echo 01/2020: EF 60-65, no RWMA, GR 1 DD, GLS -21.6%, normal RV SF, trivial MR    COVID-19    DDD (degenerative disc disease), cervical    DDD (degenerative disc disease), lumbar    Diabetes mellitus    Fibromyalgia    Gall stones    GERD (gastroesophageal reflux disease)    gastritis   Hemorrhoids    Hypertension    Interstitial cystitis    MCI (mild cognitive impairment) 11/25/2017   Memory difficulty 12/14/2013   Neuromuscular disorder (HCC)    sclerosis   Osteoporosis    Peptic ulcer    PONV (postoperative nausea and vomiting)    Pseudogout    Raynaud phenomenon    Rectal bleeding    Sleep apnea    a. on cpap.   SVT (supraventricular tachycardia) ([place])    Syncope 09/10/2015  Thyroid disease    hypothyroidism   Tremor, essential 05/14/2016   Past Surgical History:  Past Surgical History:  Procedure Laterality Date   APPENDECTOMY     BLADDER SURGERY     x2   BLADDER SURGERY     BREAST BIOPSY     CARDIAC CATHETERIZATION     CARPAL TUNNEL RELEASE     CATARACT EXTRACTION Bilateral    CHOLECYSTECTOMY     DILATION AND CURETTAGE OF UTERUS     ENTEROCELE REPAIR     x2   EYE SURGERY     retina   hysterectomy - unknown type     JOINT REPLACEMENT     KNEE ARTHROPLASTY     KNEE SURGERY     x6   NECK SURGERY     fusion   OOPHORECTOMY     QUADRICEPS REPAIR Right    RECTOCELE REPAIR     x2   SHOULDER ARTHROSCOPY WITH SUBACROMIAL DECOMPRESSION, ROTATOR CUFF REPAIR AND BICEP TENDON REPAIR  10/06/2012   Procedure: SHOULDER ARTHROSCOPY WITH SUBACROMIAL DECOMPRESSION, ROTATOR CUFF REPAIR AND BICEP TENDON  REPAIR;  Surgeon: Nita Sells, MD;  Location: [facility];  Service: Orthopedics;  Laterality: Right;  Arthroscopic  Repair  of  Subscapularis, Open Biceps Tenodesis   SHOULDER SURGERY     bilateral- bones spur   SHOULDER SURGERY Left 04/15/2021   TONSILLECTOMY     TOTAL KNEE ARTHROPLASTY     TOTAL SHOULDER ARTHROPLASTY     Social History:  reports that she has never smoked. She has never used smokeless tobacco. She reports that she does not drink alcohol and does not use drugs. Family History:  Family History  Problem Relation Age of Onset   Heart attack Mother    Stroke Mother    Diabetes Mother    Heart disease Mother    Colon polyps Mother    Asthma Mother    Dementia Mother    Heart disease Father    Heart attack Father    Asthma Father    Stroke Sister    Hypertension Sister    Rheum arthritis Sister    Dementia Sister    Asthma Sister    Lupus Sister    Asthma Sister    Breast cancer Maternal Grandmother    Wilson's disease Maternal Grandmother    Pancreatic cancer Maternal Grandfather      HOME MEDICATIONS: Allergies as of 12/15/2021       Reactions   Codeine Nausea And Vomiting   Myrbetriq  [mirabegron] Other (See Comments)   SEVERE HEADACHE   Nystatin    Percocet [oxycodone-acetaminophen] Nausea And Vomiting   Propoxyphene Nausea And Vomiting   Celebrex [celecoxib] Other (See Comments)   Other reaction(s): Other flushed   Darvocet [propoxyphene N-acetaminophen] Nausea And Vomiting   Erythromycin Nausea And Vomiting   Nausea and vomiting   Hydrocodone Nausea And Vomiting   Lyrica [pregabalin] Swelling   Legs swelling   Macrodantin [nitrofurantoin] Nausea And Vomiting   Toradol [ketorolac Tromethamine] Nausea And Vomiting   Per patient, only PO form causes nausea and vomiting. Can take injection without issue   Tramadol Nausea And Vomiting   Verapamil Nausea And Vomiting   REACTION: intolerance        Medication List         Accurate as of December 15, 2021 11:06 AM. If you have any questions, ask your nurse or doctor.          amLODipine  5 MG tablet Commonly known as: NORVASC Take 1 tablet (5 mg total) by mouth daily. Please make yearly appt with Dr. Lovena Le for February 2023 for future refills. Thank you 1st attempt   cetirizine 10 MG tablet Commonly known as: ZYRTEC Take 1 tablet by mouth in the morning.   colestipol 1 g tablet Commonly known as: COLESTID Take 1 g by mouth 2 (two) times daily.   denosumab 60 MG/ML Soln injection Commonly known as: PROLIA Inject 60 mg into the skin every 6 (six) months. Administer in upper arm, thigh, or abdomen   donepezil 10 MG tablet Commonly known as: ARICEPT TAKE ONE TABLET BY MOUTH EVERY NIGHT AT BEDTIME   gabapentin 300 MG capsule Commonly known as: NEURONTIN TAKE 1 TO 2 CAPSULES BY MOUTH UP TO TWO TIMES DAILY AS NEEDED What changed: See the new instructions.   ibuprofen 600 MG tablet Commonly known as: ADVIL ibuprofen 600 mg tablet  TAKE 1 TABLET BY MOUTH EVERY 6 HOURS AS NEEDED   ipratropium 0.03 % nasal spray Commonly known as: ATROVENT Place 2 sprays into both nostrils every 12 (twelve) hours.   leflunomide 20 MG tablet Commonly known as: ARAVA TAKE ONE TABLET BY MOUTH DAILY What changed: Another medication with the same name was removed. Continue taking this medication, and follow the directions you see here. Changed by: Dorita Sciara, MD   losartan 50 MG tablet Commonly known as: COZAAR Take 2 tablets (100 mg total) by mouth daily. Take 1 tablet in the morning and 1 tablet in evening as needed for blood pressure greater than 150   Melatonin 10 MG Caps Take 10 capsules by mouth daily.   memantine 10 MG tablet Commonly known as: NAMENDA Take 1 tablet (10 mg total) by mouth 2 (two) times daily.   metoprolol tartrate 100 MG tablet Commonly known as: LOPRESSOR TAKE 1 AND 1/2 TABLETS TWICE DAILY   ondansetron 4 MG  tablet Commonly known as: ZOFRAN Take by mouth.   ondansetron 8 MG disintegrating tablet Commonly known as: ZOFRAN-ODT Take 1 tablet by mouth as needed for nausea.   pantoprazole 40 MG tablet Commonly known as: PROTONIX Take 40 mg by mouth 2 (two) times daily.   polyethylene glycol powder 17 GM/SCOOP powder Commonly known as: GLYCOLAX/MIRALAX Take 17 g by mouth daily as needed for mild constipation or moderate constipation.   primidone 50 MG tablet Commonly known as: MYSOLINE Take 1 tablet (50 mg total) by mouth at bedtime.   venlafaxine XR 75 MG 24 hr capsule Commonly known as: EFFEXOR-XR TAKE ONE CAPSULE BY MOUTH DAILY WITH BREAFAST   VITAMIN B-12 IJ Inject 1 mL as directed every 30 (thirty) days.          OBJECTIVE:   PHYSICAL EXAM: VS: BP 118/74 (BP Location: Left Arm, Patient Position: Sitting, Cuff Size: Small)    Pulse 64    Ht 5\' 6"  (1.676 m)    Wt 174 lb (78.9 kg)    SpO2 95%    BMI 28.08 kg/m    EXAM: General: Pt appears well and is in NAD  Lungs: Clear with good BS bilat with no rales, rhonchi, or wheezes  Heart: Auscultation: RRR.  Abdomen: Normoactive bowel sounds, soft, nontender, without masses or organomegaly palpable  Extremities:  BL LE: Trace pretibial edema   Mental Status: Judgment, insight: Intact Orientation: Oriented to time, place, and person Mood and affect: No depression, anxiety, or agitation     DATA REVIEWED:  Latest Reference Range &  Units 12/15/21 11:42  Sodium 135 - 145 mEq/L 137  Potassium 3.5 - 5.1 mEq/L 4.2  Chloride 96 - 112 mEq/L 102  CO2 19 - 32 mEq/L 32  Glucose 70 - 99 mg/dL 96  BUN 6 - 23 mg/dL 9  Creatinine 0.40 - 1.20 mg/dL 0.80  Calcium 8.4 - 10.5 mg/dL 9.7  Phosphorus 2.3 - 4.6 mg/dL 3.2  Magnesium 1.5 - 2.5 mg/dL 2.0  Albumin 3.5 - 5.2 g/dL 3.8    Latest Reference Range & Units 12/15/21 11:42  GFR >60.00 mL/min 74.72    Latest Reference Range & Units 12/15/21 11:42  VITD 30.00 - 100.00 ng/mL 33.06     Latest Reference Range & Units 12/15/21 11:42  PTH, Intact 16 - 77 pg/mL 23  TSH 0.35 - 5.50 uIU/mL 0.97      06/07/2019 06/20/2021 Change from prior  RFN NA -2.6   Right total hip -1.3 -1.10 Up 4%  LFN NA -1.6   Left total hip -1.4 -0.9 Up to 8%  AP spine +0.00 +1.1 Up 11%    ASSESSMENT / PLAN / RECOMMENDATIONS:   Osteoporosis   -In review of her chart the patient has been on alendronate in 2016, she believes she started having jaw pains and also because it was once weekly dosing she was forgetting it and switch to Prolia which worked better for her. -Patient also states that her sister had several jaw issues while on Fosamax, I did explain to the patient that the risk of osteonecrosis is extremely rare. -We also discussed that the patient has been on Prolia since 2018 and despite improvement in bone density from 2020 I did recommend taking a drug holiday, but due to the short half-life of Prolia I have recommended either 1 year of oral alendronate versus 1 injection of zoledronic acid. -Patient in agreement of proceeding with zoledronic acid, will order this through the infusion suite -Emphasized the importance of calcium 1200 mg daily -BMP, PTH, vitamin D, magnesium, and phosphorus have all come back normal -Awaiting on 24-hour urine calcium excretion  2.  Primary Polydipsia:   - This is her main cause for hyponatremia given low urine Osm and almost non detectable urine sodium. - HCTZ could also be a contributing factor - Sodium has been stable     F/U 6 months  Addendum: Discussed lab results and the plan to proceed with zoledronic acid on 12/17/2021 at 1215.  Patient expressed understanding  Signed electronically by: Mack Guise, MD  [facility]  [facility] [address] [place], [place] [address] Phone: [phone] FAX: [phone]      CC: Hali Marry, [address] [place] [address] Phone: [phone]  Fax: [phone]   Return to Endocrinology clinic as below: Future Appointments  Date Time Provider [place]  12/18/2021 10:30 AM PCK NURSE PCK-PCK None  12/18/2021  2:00 PM [name], PA-C CR-G[facility] None  01/11/2022  8:00 PM Chesley Mires, MD MSD-SLEEL MSD  01/20/2022  9:50 AM Hali Marry, MD PCK-PCK None  02/06/2022 10:15 AM Collene Gobble, MD LBPU[facility]PULCARE None  04/13/2022 11:00 AM PCK-CCM PHARMACIST PCK-PCK None  05/14/2022 10:00 AM Eppolito Givens, NP GNA-GNA None

## 2021-12-15 NOTE — Patient Instructions (Addendum)
Start Calcium supplements  1000-1200 mg daily    24-Hour Urine Collection  You will be collecting your urine for a 24-hour period of time. Your timer starts with your first urine of the morning (For example - If you first pee at [place], your timer will start at [place]) [place] away your first urine of the morning Collect your urine every time you pee for the next 24 hours STOP your urine collection 24 hours after you started the collection (For example - You would stop at 9AM the day after you started)

## 2021-12-16 LAB — PARATHYROID HORMONE, INTACT (NO CA): PTH: 23 pg/mL (ref 16–77)

## 2021-12-17 ENCOUNTER — Encounter: Payer: Self-pay | Admitting: Internal Medicine

## 2021-12-17 ENCOUNTER — Other Ambulatory Visit: Payer: Medicare Other

## 2021-12-17 DIAGNOSIS — M810 Age-related osteoporosis without current pathological fracture: Secondary | ICD-10-CM

## 2021-12-18 ENCOUNTER — Ambulatory Visit: Payer: Medicare Other | Admitting: Sports Medicine

## 2021-12-18 ENCOUNTER — Ambulatory Visit (INDEPENDENT_AMBULATORY_CARE_PROVIDER_SITE_OTHER): Payer: Medicare Other | Admitting: Physician Assistant

## 2021-12-18 ENCOUNTER — Ambulatory Visit (INDEPENDENT_AMBULATORY_CARE_PROVIDER_SITE_OTHER): Payer: Medicare Other | Admitting: Family Medicine

## 2021-12-18 ENCOUNTER — Other Ambulatory Visit: Payer: Self-pay

## 2021-12-18 ENCOUNTER — Encounter: Payer: Self-pay | Admitting: Physician Assistant

## 2021-12-18 VITALS — BP 145/89 | HR 55 | Ht 66.0 in | Wt 171.6 lb

## 2021-12-18 VITALS — BP 158/71 | HR 60

## 2021-12-18 DIAGNOSIS — M5134 Other intervertebral disc degeneration, thoracic region: Secondary | ICD-10-CM

## 2021-12-18 DIAGNOSIS — E538 Deficiency of other specified B group vitamins: Secondary | ICD-10-CM | POA: Diagnosis not present

## 2021-12-18 DIAGNOSIS — G5601 Carpal tunnel syndrome, right upper limb: Secondary | ICD-10-CM

## 2021-12-18 DIAGNOSIS — I7300 Raynaud's syndrome without gangrene: Secondary | ICD-10-CM | POA: Diagnosis not present

## 2021-12-18 DIAGNOSIS — Z8719 Personal history of other diseases of the digestive system: Secondary | ICD-10-CM

## 2021-12-18 DIAGNOSIS — M5030 Other cervical disc degeneration, unspecified cervical region: Secondary | ICD-10-CM | POA: Diagnosis not present

## 2021-12-18 DIAGNOSIS — R5382 Chronic fatigue, unspecified: Secondary | ICD-10-CM

## 2021-12-18 DIAGNOSIS — M1712 Unilateral primary osteoarthritis, left knee: Secondary | ICD-10-CM

## 2021-12-18 DIAGNOSIS — Z8679 Personal history of other diseases of the circulatory system: Secondary | ICD-10-CM

## 2021-12-18 DIAGNOSIS — M818 Other osteoporosis without current pathological fracture: Secondary | ICD-10-CM

## 2021-12-18 DIAGNOSIS — M797 Fibromyalgia: Secondary | ICD-10-CM | POA: Diagnosis not present

## 2021-12-18 DIAGNOSIS — M1120 Other chondrocalcinosis, unspecified site: Secondary | ICD-10-CM | POA: Diagnosis not present

## 2021-12-18 DIAGNOSIS — Z8781 Personal history of (healed) traumatic fracture: Secondary | ICD-10-CM

## 2021-12-18 DIAGNOSIS — Z8619 Personal history of other infectious and parasitic diseases: Secondary | ICD-10-CM

## 2021-12-18 DIAGNOSIS — M5136 Other intervertebral disc degeneration, lumbar region: Secondary | ICD-10-CM

## 2021-12-18 DIAGNOSIS — Z79899 Other long term (current) drug therapy: Secondary | ICD-10-CM | POA: Diagnosis not present

## 2021-12-18 DIAGNOSIS — M0600 Rheumatoid arthritis without rheumatoid factor, unspecified site: Secondary | ICD-10-CM | POA: Diagnosis not present

## 2021-12-18 DIAGNOSIS — Z96651 Presence of right artificial knee joint: Secondary | ICD-10-CM | POA: Diagnosis not present

## 2021-12-18 DIAGNOSIS — Z9889 Other specified postprocedural states: Secondary | ICD-10-CM

## 2021-12-18 DIAGNOSIS — M51369 Other intervertebral disc degeneration, lumbar region without mention of lumbar back pain or lower extremity pain: Secondary | ICD-10-CM

## 2021-12-18 DIAGNOSIS — R7989 Other specified abnormal findings of blood chemistry: Secondary | ICD-10-CM | POA: Diagnosis not present

## 2021-12-18 DIAGNOSIS — N3010 Interstitial cystitis (chronic) without hematuria: Secondary | ICD-10-CM

## 2021-12-18 LAB — CBC WITH DIFFERENTIAL/PLATELET
Absolute Monocytes: 927 cells/uL (ref 200–950)
Basophils Absolute: 84 cells/uL (ref 0–200)
Basophils Relative: 1.1 %
Eosinophils Absolute: 122 cells/uL (ref 15–500)
Eosinophils Relative: 1.6 %
HCT: 39.3 % (ref 35.0–45.0)
Hemoglobin: 12.8 g/dL (ref 11.7–15.5)
Lymphs Abs: 1892 cells/uL (ref 850–3900)
MCH: 30.3 pg (ref 27.0–33.0)
MCHC: 32.6 g/dL (ref 32.0–36.0)
MCV: 92.9 fL (ref 80.0–100.0)
MPV: 10.5 fL (ref 7.5–12.5)
Monocytes Relative: 12.2 %
Neutro Abs: 4575 cells/uL (ref 1500–7800)
Neutrophils Relative %: 60.2 %
Platelets: 377 10*3/uL (ref 140–400)
RBC: 4.23 10*6/uL (ref 3.80–5.10)
RDW: 13 % (ref 11.0–15.0)
Total Lymphocyte: 24.9 %
WBC: 7.6 10*3/uL (ref 3.8–10.8)

## 2021-12-18 LAB — HEPATIC FUNCTION PANEL
AG Ratio: 1.5 (calc) (ref 1.0–2.5)
ALT: 42 U/L — ABNORMAL HIGH (ref 6–29)
AST: 24 U/L (ref 10–35)
Albumin: 4.1 g/dL (ref 3.6–5.1)
Alkaline phosphatase (APISO): 98 U/L (ref 37–153)
Bilirubin, Direct: 0.1 mg/dL (ref 0.0–0.2)
Globulin: 2.7 g/dL (calc) (ref 1.9–3.7)
Indirect Bilirubin: 0.4 mg/dL (calc) (ref 0.2–1.2)
Total Bilirubin: 0.5 mg/dL (ref 0.2–1.2)
Total Protein: 6.8 g/dL (ref 6.1–8.1)

## 2021-12-18 MED ORDER — CYANOCOBALAMIN 1000 MCG/ML IJ SOLN
1000.0000 ug | Freq: Once | INTRAMUSCULAR | Status: AC
  Administered 2021-12-18: 1000 ug via INTRAMUSCULAR

## 2021-12-18 NOTE — Progress Notes (Signed)
Patient is here for a vitamin B12 injection. Denies gastrointestinal problems or dizziness. B12 injection to LUOQ per patient request with no apparent complications. Patient advised to schedule next injection in 30 days.

## 2021-12-18 NOTE — Progress Notes (Signed)
Agree with documentation as above.   Bradyn Soward, MD  

## 2021-12-19 ENCOUNTER — Ambulatory Visit: Payer: Medicare Other | Admitting: Physician Assistant

## 2021-12-19 ENCOUNTER — Telehealth: Payer: Self-pay | Admitting: *Deleted

## 2021-12-19 DIAGNOSIS — K580 Irritable bowel syndrome with diarrhea: Secondary | ICD-10-CM | POA: Diagnosis not present

## 2021-12-19 DIAGNOSIS — K219 Gastro-esophageal reflux disease without esophagitis: Secondary | ICD-10-CM | POA: Diagnosis not present

## 2021-12-19 DIAGNOSIS — R7989 Other specified abnormal findings of blood chemistry: Secondary | ICD-10-CM

## 2021-12-19 DIAGNOSIS — Z79899 Other long term (current) drug therapy: Secondary | ICD-10-CM

## 2021-12-19 NOTE — Progress Notes (Signed)
CBC is normal, ALT is elevated.  Please advise patient to avoid NSAIDs and alcohol use.  Repeat AST and ALT in 1 month.

## 2021-12-19 NOTE — Telephone Encounter (Signed)
-----   Message from Bo Merino, MD sent at 12/19/2021  2:37 PM EST ----- CBC is normal, ALT is elevated.  Please advise patient to avoid NSAIDs and alcohol use.  Repeat AST and ALT in 1 month.

## 2021-12-23 ENCOUNTER — Other Ambulatory Visit: Payer: Self-pay | Admitting: Internal Medicine

## 2021-12-23 DIAGNOSIS — D692 Other nonthrombocytopenic purpura: Secondary | ICD-10-CM | POA: Diagnosis not present

## 2021-12-23 DIAGNOSIS — L720 Epidermal cyst: Secondary | ICD-10-CM | POA: Diagnosis not present

## 2021-12-23 DIAGNOSIS — L920 Granuloma annulare: Secondary | ICD-10-CM | POA: Diagnosis not present

## 2021-12-26 LAB — CALCIUM, URINE, 24 HOUR: Calcium, 24H Urine: 56 mg/24 h

## 2021-12-26 LAB — CORTISOL, URINE, 24 HOUR
24 Hour urine volume (VMAHVA): 4000 mL
CREATININE, URINE: 0.68 g/(24.h) (ref 0.50–2.15)

## 2021-12-30 ENCOUNTER — Other Ambulatory Visit: Payer: Self-pay | Admitting: Pharmacy Technician

## 2021-12-30 ENCOUNTER — Encounter: Payer: Self-pay | Admitting: Internal Medicine

## 2021-12-30 ENCOUNTER — Telehealth: Payer: Self-pay | Admitting: Pharmacy Technician

## 2021-12-30 NOTE — Telephone Encounter (Addendum)
Auth Submission: no auth needed ?Payer: MEDICARE / BCBS SUPP ?Medication & CPT/J Code(s) submitted: RECLAST J3489 ?Route of submission (phone, fax, portal): PHONE ?Auth type: Buy/Bill ?Units/visits requested: '5MG'$  X1 DOSE ?Reference number: [id number] - medicare ?[place] 12/30/21'@1'$ :47 - BCBS SUPP ?Approval from: 12/30/21 to 12/31/22  ? ?Patient will be scheduled as soon as possible. ?

## 2022-01-06 ENCOUNTER — Ambulatory Visit (INDEPENDENT_AMBULATORY_CARE_PROVIDER_SITE_OTHER): Payer: Medicare Other

## 2022-01-06 ENCOUNTER — Telehealth: Payer: Self-pay | Admitting: Emergency Medicine

## 2022-01-06 ENCOUNTER — Other Ambulatory Visit: Payer: Self-pay

## 2022-01-06 ENCOUNTER — Encounter: Primary: Internal Medicine

## 2022-01-06 ENCOUNTER — Inpatient Hospital Stay: Admit: 2022-01-06 | Payer: MEDICARE | Primary: Internal Medicine

## 2022-01-06 ENCOUNTER — Ambulatory Visit: Admit: 2022-01-06 | Discharge: 2022-01-06 | Payer: MEDICARE | Primary: Internal Medicine

## 2022-01-06 VITALS — BP 151/75 | HR 55 | Temp 97.9°F | Resp 18 | Ht 66.0 in | Wt 174.8 lb

## 2022-01-06 DIAGNOSIS — E559 Vitamin D deficiency, unspecified: Secondary | ICD-10-CM

## 2022-01-06 DIAGNOSIS — M810 Age-related osteoporosis without current pathological fracture: Secondary | ICD-10-CM | POA: Diagnosis not present

## 2022-01-06 DIAGNOSIS — R29898 Other symptoms and signs involving the musculoskeletal system: Secondary | ICD-10-CM

## 2022-01-06 DIAGNOSIS — L819 Disorder of pigmentation, unspecified: Secondary | ICD-10-CM | POA: Diagnosis not present

## 2022-01-06 DIAGNOSIS — L509 Urticaria, unspecified: Secondary | ICD-10-CM | POA: Diagnosis not present

## 2022-01-06 DIAGNOSIS — L920 Granuloma annulare: Secondary | ICD-10-CM | POA: Diagnosis not present

## 2022-01-06 LAB — BASIC METABOLIC PANEL
Anion Gap: 3 mmol/L (ref 3.0–18)
BUN: 12 MG/DL (ref 7.0–18)
Bun/Cre Ratio: 18 (ref 12–20)
CO2: 30 mmol/L (ref 21–32)
Calcium: 9 MG/DL (ref 8.5–10.1)
Chloride: 109 mmol/L (ref 100–111)
Creatinine: 0.65 MG/DL (ref 0.6–1.3)
Est, Glom Filt Rate: >60 mL/min/{1.73_m2} (ref >60)
Glucose: 115 mg/dL — ABNORMAL HIGH (ref 74–99)
Potassium: 4.2 mmol/L (ref 3.5–5.5)
Sodium: 142 mmol/L (ref 136–145)

## 2022-01-06 LAB — HEMOGLOBIN A1C
Hemoglobin A1C: 5.7 % — ABNORMAL HIGH (ref 4.2–5.6)
eAG: 117 mg/dL

## 2022-01-06 MED ORDER — ZOLEDRONIC ACID 5 MG/100ML IV SOLN
5.0000 mg | Freq: Once | INTRAVENOUS | Status: AC
  Administered 2022-01-06: 5 mg via INTRAVENOUS
  Filled 2022-01-06: qty 100

## 2022-01-06 MED ORDER — SODIUM CHLORIDE 0.9 % IV SOLN: INTRAVENOUS | Status: DC

## 2022-01-06 MED ORDER — DIPHENHYDRAMINE HCL 25 MG PO CAPS
25.0000 mg | ORAL_CAPSULE | Freq: Once | ORAL | Status: AC
  Administered 2022-01-06: 25 mg via ORAL
  Filled 2022-01-06: qty 1

## 2022-01-06 MED ORDER — ACETAMINOPHEN 325 MG PO TABS
650.0000 mg | ORAL_TABLET | Freq: Once | ORAL | Status: AC
  Administered 2022-01-06: 650 mg via ORAL
  Filled 2022-01-06: qty 2

## 2022-01-06 NOTE — Telephone Encounter (Signed)
Created in error. Patient calling on behalf of husband not herself.  ?

## 2022-01-06 NOTE — Progress Notes (Signed)
Diagnosis: Osteoporosis ? ?Provider:  Marshell Garfinkel, MD ? ?Procedure: Infusion ? ?IV Type: Peripheral, IV Location: R Forearm ? ?Reclast (Zolendronic Acid), Dose: '5mg'$  ? ?Infusion Start Time: 14.45 ? ?Infusion Stop Time: 15.19 ? ?Post Infusion IV Care: Observation period completed and Peripheral IV Discontinued ? ?Discharge: Condition: Good, Destination: Home . AVS provided to patient.  ? ?Performed by:  Magdalena Skilton, RN  ?  ?

## 2022-01-07 ENCOUNTER — Other Ambulatory Visit: Payer: Self-pay | Admitting: Internal Medicine

## 2022-01-07 ENCOUNTER — Other Ambulatory Visit: Payer: Self-pay | Admitting: Family Medicine

## 2022-01-07 LAB — VITAMIN D 25 HYDROXY: Vit D, 25-Hydroxy: 93.3 ng/mL (ref 30–100)

## 2022-01-09 ENCOUNTER — Ambulatory Visit: Payer: Medicare Other

## 2022-01-11 ENCOUNTER — Ambulatory Visit (HOSPITAL_BASED_OUTPATIENT_CLINIC_OR_DEPARTMENT_OTHER): Payer: Medicare Other | Attending: Emergency Medicine | Admitting: Pulmonary Disease

## 2022-01-11 ENCOUNTER — Other Ambulatory Visit: Payer: Self-pay

## 2022-01-11 DIAGNOSIS — R0683 Snoring: Secondary | ICD-10-CM | POA: Diagnosis not present

## 2022-01-11 DIAGNOSIS — G4733 Obstructive sleep apnea (adult) (pediatric): Secondary | ICD-10-CM | POA: Diagnosis not present

## 2022-01-12 DIAGNOSIS — J069 Acute upper respiratory infection, unspecified: Secondary | ICD-10-CM | POA: Diagnosis not present

## 2022-01-12 DIAGNOSIS — Z03818 Encounter for observation for suspected exposure to other biological agents ruled out: Secondary | ICD-10-CM | POA: Diagnosis not present

## 2022-01-12 DIAGNOSIS — J189 Pneumonia, unspecified organism: Secondary | ICD-10-CM | POA: Diagnosis not present

## 2022-01-12 DIAGNOSIS — Z1159 Encounter for screening for other viral diseases: Secondary | ICD-10-CM | POA: Diagnosis not present

## 2022-01-12 DIAGNOSIS — J209 Acute bronchitis, unspecified: Secondary | ICD-10-CM | POA: Diagnosis not present

## 2022-01-13 ENCOUNTER — Ambulatory Visit: Payer: MEDICARE | Attending: Internal Medicine | Primary: Internal Medicine

## 2022-01-13 ENCOUNTER — Ambulatory Visit: Admit: 2022-01-13 | Discharge: 2022-01-13 | Payer: MEDICARE | Attending: Internal Medicine | Primary: Internal Medicine

## 2022-01-13 DIAGNOSIS — Z0000 Encounter for general adult medical examination without abnormal findings: Secondary | ICD-10-CM

## 2022-01-13 NOTE — Patient Instructions (Signed)
Advance Directives: Care Instructions  Overview  An advance directive is a legal way to state your wishes at the end of your life. It tells your family and your doctor what to do if you can't say what you want.  There are two main types of advance directives. You can change them any time your wishes change.  Living will.  This form tells your family and your doctor your wishes about life support and other treatment. The form is also called a declaration.  Medical power of attorney.  This form lets you name a person to make treatment decisions for you when you can't speak for yourself. This person is called a health care agent (health care proxy, health care surrogate). The form is also called a durable power of attorney for health care.  If you do not have an advance directive, decisions about your medical care may be made by a family member, or by a doctor or a judge who doesn't know you.  It may help to think of an advance directive as a gift to the people who care for you. If you have one, they won't have to make tough decisions by themselves.  For more information, including forms for your state, see the [place] website (RebankingSpace.hu).  Follow-up care is a key part of your treatment and safety. Be sure to make and go to all appointments, and call your doctor if you are having problems. It's also a good idea to know your test results and keep a list of the medicines you take.  What should you include in an advance directive?  Many states have a unique advance directive form. (It may ask you to address specific issues.) Or you might use a universal form that's approved by many states.  If your form doesn't tell you what to address, it may be hard to know what to include in your advance directive. Use the questions below to help you get started.  Who do you want to make decisions about your medical care if you are not able to?  What life-support measures do you want if you  have a serious illness that gets worse over time or can't be cured?  What are you most afraid of that might happen? (Maybe you're afraid of having pain, losing your independence, or being kept alive by machines.)  Where would you prefer to die? (Your home? A hospital? A nursing home?)  Do you want to donate your organs when you die?  Do you want certain religious practices performed before you die?  When should you call for help?  Be sure to contact your doctor if you have any questions.  Where can you learn more?  Go to https://www.bennett.info/ and enter R264 to learn more about "Advance Directives: Care Instructions."  Current as of: April 10, 2021??????????????????????????????Content Version: 13.6  ?? 2006-2023 Healthwise, Incorporated.   Care instructions adapted under license by [facility]. If you have questions about a medical condition or this instruction, always ask your healthcare professional. [place] any warranty or liability for your use of this information.           A Healthy Heart: Care Instructions  Your Care Instructions     Coronary artery disease, also called heart disease, occurs when a substance called plaque builds up in the vessels that supply oxygen-rich blood to your heart muscle. This can narrow the blood vessels and reduce blood flow. A heart attack happens when blood flow is completely blocked.  A high-fat diet, smoking, and other factors increase the risk of heart disease.  Your doctor has found that you have a chance of having heart disease. You can do lots of things to keep your heart healthy. It may not be easy, but you can change your diet, exercise more, and quit smoking. These steps really work to lower your chance of heart disease.  Follow-up care is a key part of your treatment and safety. Be sure to make and go to all appointments, and call your doctor if you are having problems. It's also a good idea to know your test results and keep a list of the  medicines you take.  How can you care for yourself at home?  Diet  ?? Use less salt when you cook and eat. This helps lower your blood pressure. Taste food before salting. Add only a little salt when you think you need it. With time, your taste buds will adjust to less salt.   ?? Eat fewer snack items, fast foods, canned soups, and other high-salt, high-fat, processed foods.   ?? Read food labels and try to avoid saturated and trans fats. They increase your risk of heart disease by raising cholesterol levels.   ?? Limit the amount of solid fat-butter, margarine, and shortening-you eat. Use olive, peanut, or canola oil when you cook. Bake, broil, and steam foods instead of frying them.   ?? Eat a variety of fruit and vegetables every day. Dark green, deep orange, red, or yellow fruits and vegetables are especially good for you. Examples include spinach, carrots, peaches, and berries.   ?? Foods high in fiber can reduce your cholesterol and provide important vitamins and minerals. High-fiber foods include whole-grain cereals and breads, oatmeal, beans, brown rice, citrus fruits, and apples.   ?? Eat lean proteins. Heart-healthy proteins include seafood, lean meats and poultry, eggs, beans, peas, nuts, seeds, and soy products.   ?? Limit drinks and foods with added sugar. These include candy, desserts, and soda pop.   Lifestyle changes  ?? If your doctor recommends it, get more exercise. Walking is a good choice. Bit by bit, increase the amount you walk every day. Try for at least 30 minutes on most days of the week. You also may want to swim, bike, or do other activities.   ?? Do not smoke. If you need help quitting, talk to your doctor about stop-smoking programs and medicines. These can increase your chances of quitting for good. Quitting smoking may be the most important step you can take to protect your heart. It is never too late to quit.   ?? Limit alcohol to 2 drinks a day for men and 1 drink a day for women. Too much  alcohol can cause health problems.   ?? Manage other health problems such as diabetes, high blood pressure, and high cholesterol. If you think you may have a problem with alcohol or drug use, talk to your doctor.   Medicines  ?? Take your medicines exactly as prescribed. Call your doctor if you think you are having a problem with your medicine.   ?? If your doctor recommends aspirin, take the amount directed each day. Make sure you take aspirin and not another kind of pain reliever, such as acetaminophen (Tylenol).   When should you call for help?   Call 911 if you have symptoms of a heart attack. These may include:  ?? Chest pain or pressure, or a strange feeling in the chest.   ??  Sweating.   ?? Shortness of breath.   ?? Pain, pressure, or a strange feeling in the back, neck, jaw, or upper belly or in one or both shoulders or arms.   ?? Lightheadedness or sudden weakness.   ?? A fast or irregular heartbeat.   After you call 911, the operator may tell you to chew 1 adult-strength or 2 to 4 low-dose aspirin. Wait for an ambulance. Do not try to drive yourself.  Watch closely for changes in your health, and be sure to contact your doctor if you have any problems.  Where can you learn more?  Go to https://www.bennett.info/ and enter F075 to learn more about "A Healthy Heart: Care Instructions."  Current as of: July 02, 2021??????????????????????????????Content Version: 13.6  ?? 2006-2023 Healthwise, Incorporated.   Care instructions adapted under license by [facility]. If you have questions about a medical condition or this instruction, always ask your healthcare professional. [place] any warranty or liability for your use of this information.      Personalized Preventive Plan for Laura Roman - 01/13/2022  Medicare offers a range of preventive health benefits. Some of the tests and screenings are paid in full while other may be subject to a deductible, co-insurance, and/or copay.    Some of these  benefits include a comprehensive review of your medical history including lifestyle, illnesses that may run in your family, and various assessments and screenings as appropriate.    After reviewing your medical record and screening and assessments performed today your provider may have ordered immunizations, labs, imaging, and/or referrals for you.  A list of these orders (if applicable) as well as your Preventive Care list are included within your After Visit Summary for your review.    Other Preventive Recommendations:    A preventive eye exam performed by an eye specialist is recommended every 1-2 years to screen for glaucoma; cataracts, macular degeneration, and other eye disorders.  A preventive dental visit is recommended every 6 months.  Try to get at least 150 minutes of exercise per week or 10,000 steps per day on a pedometer .  Order or download the FREE "Exercise & Physical Activity: Your Everyday Guide" from [employer]. Call (781) 161-6894 or search [employer] online.  You need 1200-1500 mg of calcium and 1000-2000 IU of vitamin D per day. It is possible to meet your calcium requirement with diet alone, but a vitamin D supplement is usually necessary to meet this goal.  When exposed to the sun, use a sunscreen that protects against both UVA and UVB radiation with an SPF of 30 or greater. Reapply every 2 to 3 hours or after sweating, drying off with a towel, or swimming.  Always wear a seat belt when traveling in a car. Always wear a helmet when riding a bicycle or motorcycle.

## 2022-01-13 NOTE — ACP (Advance Care Planning) (Signed)
Advance Care Planning     Advance Care Planning (ACP) Physician/NP/PA Conversation    Date of Conversation: 01/13/2022  Conducted with: Patient with Decision Making Capacity    Healthcare Decision Maker:      Primary Decision Maker: [name] - Spouse - [phone]    Click here to complete Healthcare Decision Makers including selection of the Healthcare Decision Maker Relationship (ie "Primary")  Today we documented Decision Maker(s) consistent with Legal Next of Kin hierarchy.    Care Preferences:    Hospitalization:  "If your health worsens and it becomes clear that your chance of recovery is unlikely, what would be your preference regarding hospitalization?"  The patient would prefer hospitalization.    Ventilation:  "If you were unable to breath on your own and your chance of recovery was unlikely, what would be your preference about the use of a ventilator (breathing machine) if it was available to you?"  The patient would desire the use of a ventilator.    Resuscitation:  "In the event your heart stopped as a result of an underlying serious health condition, would you want attempts made to restart your heart, or would you prefer a natural death?"  Yes, attempt to resuscitate.    ventilation preferences, hospitalization preferences, and resuscitation preferences    Conversation Outcomes / Follow-Up Plan:  ACP in process - information provided, considering goals and options  Reviewed DNR/DNI and patient elects Full Code (Attempt Resuscitation)    Length of Voluntary ACP Conversation in minutes:  16 minutes    Laura Bittinger Jene Every, MD

## 2022-01-13 NOTE — Progress Notes (Signed)
71 y.o. female who presents for evaluation.    She has finished up the chemoradiation, seeing Dr Mariea Clonts.  The neuropathy is doing okay, taking the gaba at night only.  Has mammo and CT scheduled in the near future    Denied any cardiovascular complaints. Not much exercise as she has her mom and husband to help take care of plus chores, cooking, etc. The edema is controlled    No gi or gu issues .  Still seeing Dr Sheral Flow in follow up and CT scheduled for summer    Denies polyuria, polydipsia, nocturia, vision change.     No sx referable to the thyroid    Of note, oncology wants her on the high dose vit d apparently    LAST MEDICARE WELLNESS EXAM: 11/10/16, 11/23/17, 11/29/18, 12/14/19, 01/06/21, 01/13/22    Past Medical History:   Diagnosis Date    Allergic rhinitis     Anxiety     Breast cancer ([place]) 01/2020    Dr Jennye Boroughs; LEFT T2N0M0 invasive adenoca  ER/PR/Her2 neg Dr Meryl Crutch neoadjuvant AC-Taxol    Compression fx, thoracic spine [facility]) 2007    negative DEXA Dr. Marisa Hua    DM (diabetes mellitus) ([place]) 10/2017    on basis of fbs>125; intol metformin    Dyslipidemia     Endometrial cancer ([place]) 02/2017    Dr Sheral Flow; stage 1B gr 1 endometroid adenoca w foci squamous diff of the endometrium; RA TLH BSO PLND, 0/13LN, MSI intact    Frozen shoulder     right Dr. 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 Synetta Shadow, Dr. Marisa Hua    Osteopenia     DEXA -0.5 spine, -0.8 hip (1/18); 0.5 spine, -1.3 hip (3/22)    Overweight (BMI 25.0-29.9)     IF 7/18 start weight 168 lbs not doing     Painless hematuria 04/2016    Dr Jimmye Norman, neg eval    Palpitations 2008    neg thallium  2008, nl holter 2008, echo nl lv/ef 65%/tr mr/dd/nl pasp    Syncope     neurocardiogenic by tilt 1994    Venous insufficiency     Vitamin D deficiency      Past Surgical History:   Procedure Laterality Date    BREAST BIOPSY Left 2021    Dr Jennye Boroughs    COLONOSCOPY      Dr Rosendo Gros (2007) neg; (04/22/18) neg    HEMORRHOID SURGERY      Dr. Rosendo Gros 2007    HYSTERECTOMY (CERVIX STATUS UNKNOWN)  03/11/2017    Dr Sheral Flow    TUBAL LIGATION      UROLOGICAL SURGERY  06/2016    Dr Jimmye Norman; bladder bx showed benign lesions    US BREAST BIOPSY W LOC DEVICE 1ST LESION LEFT Left 02/21/2020    US BREAST NEEDLE BIOPSY LEFT 02/21/2020 [facility] [place]     Social History     Socioeconomic History    Marital status: Married     Spouse name:  Not on file    Number of children: 2    Years of education: Not on file    Highest education level: Not on file   Occupational History    Occupation: ret benefits specialist   Tobacco Use    Smoking status: Never    Smokeless tobacco: Never   Substance and Sexual Activity    Alcohol use: Yes     Alcohol/week: 4.0 standard drinks    Drug use: No    Sexual activity: Not on file   Other Topics Concern    Not on file   Social History Narrative    Not on file     Social Determinants of Health     Financial Resource Strain: Low Risk     Difficulty of Paying Living Expenses: Not hard at all   Food Insecurity: No Food Insecurity    Worried About [place] in the Last Year: Never true    [place] in the Last Year: Never true   Transportation Needs: Unknown    Lack of Transportation (Medical): Not on file    Lack of Transportation (Non-Medical): No   Physical Activity: Insufficiently Active    Days of Exercise per Week: 3 days    Minutes of Exercise per Session: 20 min   Stress: Not on file   Social Connections: Not on file   Intimate Partner Violence: Not on file   Housing Stability: Unknown    Unable to Pay for Housing in the Last Year: Not on file    Number of Places Lived in the Last Year: Not on  file    Unstable Housing in the Last Year: No     Current Outpatient Medications   Medication Sig    Cholecalciferol (VITAMIN D3) 1.25 MG (50000 UT) CAPS Take 1 capsule by mouth once a week    Lancets MISC Use daily as directed    aspirin 81 MG chewable tablet Take 81 mg by mouth daily    atorvastatin (LIPITOR) 40 MG tablet take 1 tablet by mouth once daily    citalopram (CELEXA) 10 MG tablet take 1 tablet by mouth once daily    gabapentin (NEURONTIN) 300 MG capsule Take 300 mg by mouth daily.    lansoprazole (PREVACID) 30 MG delayed release capsule take 1 capsule by mouth BEFORE BREAKFAST DAILY    SITagliptin (JANUVIA) 100 MG tablet take 1 tablet by mouth once daily     No current facility-administered medications for this visit.     Allergies   Allergen Reactions    Fish Oil Nausea Only     Patient denies  Other reaction(s): gi distress  Patient denies    Lidocaine Other (See Comments)     anxious    Metformin Diarrhea    Nabumetone Nausea Only    Thimerosal Hives     REVIEW OF SYSTEMS:  mammo 9/21, colo 6/19 Dr Rosendo Gros, DEXA 1/18 Dr Margaretann Loveless - no vision change or eye pain  Oral - no mouth pain, tongue or tooth problems  Ears - no hearing loss, ear pain, fullness, no swallowing problems  Cardiac - no CP, PND, orthopnea, edema, palpitations or syncope  Chest - no breast masses  Resp - no wheezing, chronic coughing, dyspnea  Urinary - no dysuria, hematuria, flank pain, urgency, frequency    Vitals:    01/13/22 0937   BP: 119/68   Pulse: 84   Resp: 17   Temp: 97.6 ??F (  36.4 ??C)   SpO2: 97%     Affect is appropriate.  Mood stable  No apparent distress  HEENT --Anicteric sclerae.  No JVD, or bruits.  Thyroid fullness on left  Lungs --Clear to auscultation and percussion, normal percussion.  Heart --Regular rate and rhythm, no murmurs, rubs, gallops, or clicks.  Abdomen -- Soft and nontender, no hepatosplenomegaly or masses.  Extremities -- Without cyanosis, clubbing, tr ankle edema notd. 2+ pulses equally and  bilaterally.  Varicose veins bilat    LABS  From 5/10 showed   gluc 110, cr 0.70,               alt 23,                                   chol 154, tg 143, hdl 45, ldl-c 80,                                                            tsh 1.91  From 5/10 showed                       2 hr GTT 89  From 8/10 showed                                                                vit d 23.0, ck 57, aldo 5.4  From 8/11 showed   gluc 108,                                     hba1c 6.3,                   chol 160, tg 172, hdl 39, ldl-c 87,  wbc 5.,7 hb 12.3, plt 235, ua neg,     tsh 2.28  From 5/12 showed   gluc 113, cr 0.71, gfr 94,  alt 16, hba1c 6.2, ldl-p 1989, chol 177, tg 161, hdl 43, ldl-c 102  From 11/12 showed                                                     hba1c 5.9, ldl-p 1218, chol 133, tg 116, hdl 45, ldl-c 65  From 5/13 showed   gluc 105, cr 0.55, gfr 102, alt 8,  hba1c 6.2,                   chol 148, tg 107, hdl 45, ldl-c 82,  wbc 5.0, hb 12.5, plt 172, vit d 40.3  From 11/13 showed         hba1c 6.2, ldl-p 1888, chol 176, tg 155, hdl 49, ldl-c 86,  wbc 6.2, hb 12.3, plt 182, vit d 34.4  From 5/14 showed  hba1c 6.2,      chol 152, tg 157, hdl 39, ldl-c 82  From 1/15 showed   gluc 113, cr 0.68, gfr>60, alt 11, hba1c 6.2,      chol 146, tg 130, hdl 43, ldl-c 77  From 7/15 showed         hba1c 6.3,      chol 133, tg 124, hdl 39, ldl-c 69,  wbc 6.3, hb 12.2, plt 193  From 6/16 showed   gluc 106, cr 0.74, gfr>60, alt 26, hba1c 6.3,      chol 126, tg 125, hdl 46, ldl-c 55,  wbc 5.7, hb 13.2, plt 203, vit d 7.2,   tsh 1.69  From 12/16 showed gluc 110, cr 0.68, gfr>60, alt 30, hba1c 6.1,      chol 135, tg 147, hdl 47, ldl-c 59,  wbc 6.0, hb 12.7, plt 197  From 6/17 showed   gluc 122, cr 0.71, gfr>60, alt 33, hba1c 6.2,      chol 153, tg 140, hdl 46, ldl-c 79,  wbc 6.3, hb 13.1, plt 190,           tsh 1.92, hep c-  From 1/18 showed         hba1c 6.2,     chol 154, tg 181, hdl 41, ldl-c 77  From 7/18 showed    gluc 129, cr 0.63, gfr>60, alt 26, hba1c 6.1,                 wbc 6.0, hb 12.4, plt 193, vit d 79  From 1/19 showed   gluc 132, cr 0.83, gfr>60,     hba1c 6.1  From 4/19 showed         hba1c 7.0, umar 12 ,   chol 124, tg 109, hdl 46, ldl-c 56,                    vit d 56.1  From 7/19 showed   gluc 106, cr 0.67, gfr>60, alt 25, hba1c 6.0,                 wbc 5.8, hb 13.0, plt 183  From 1/20 showed        hba1c 6.0, umar 17,    chol 130, tg 113, hdl 46, ldl-c 61  From 8/20 showed   gluc 103, cr 0.73, gfr>60, alt 27, hba1c 5.7,       chol 124, tg 150, hdl 44, ldl-c 50,  wbc 5.0, hb13.0, plt 190  Form 2/21 showed         hba1c 5.9, umar na,             vit d 88.9  From 8/21 showed   gluc 109, cr 0.68, gfr>60, alt 35, hba1c 5.5,       chol 156, tg 142, hdl 51, ldl-c 77, wbc 4.2, hb 9.9,   plt 152, vit d 34.8  From 3/22 showed   gluc 100, cr 0.68, gfr>60, alt 22, hba1c 5.7,       chol 129, tg 137, hdl 48, ldl-c 54  From 9/22 showed   gluc 110, cr 0.72, gfr>60, alt 24, hba1c 5.8,       chol 128, tg 129, hdl 44, ldl-c 58, wbc 5.6, hb 12.0, plt 184    Results for orders placed or performed during the hospital encounter of 01/06/22 (from the past 2160 hour(s))   Vitamin D 25 Hydroxy   Result Value Ref Range  Vit D, 25-Hydroxy 93.3 30 - 100 ng/mL   Basic Metabolic Panel   Result Value Ref Range    Sodium 142 136 - 145 mmol/L    Potassium 4.2 3.5 - 5.5 mmol/L    Chloride 109 100 - 111 mmol/L    CO2 30 21 - 32 mmol/L    Anion Gap 3 3.0 - 18 mmol/L    Glucose 115 (H) 74 - 99 mg/dL    BUN 12 7.0 - 18 MG/DL    Creatinine 0.65 0.6 - 1.3 MG/DL    Bun/Cre Ratio 18 12 - 20      Est, Glom Filt Rate >60 >60 ml/min/1.50m    Calcium 9.0 8.5 - 10.1 MG/DL   Hemoglobin A1C   Result Value Ref Range    Hemoglobin A1C 5.7 (H) 4.2 - 5.6 %    eAG 117 mg/dL     We reviewed the patient's labs from the last several visits to point out trends in the numbers      Patient Active Problem List   Diagnosis    Dyslipidemia    Anxiety    Vitamin D  deficiency    History of breast cancer    Neuropathy due to chemotherapeutic drug (HCC)    Overweight with body mass index (BMI) of 25 to 25.9 in adult    Arthritis, degenerative    Controlled type 2 diabetes mellitus, without long-term current use of insulin (HCC)       Assessment and plan:  1.  DM.  Continue januvia and controlled.  F/u ophth  2.  Hyperlipidemia. Continue lipitor and at target  3.  Hypovitaminosis D. Discussed risk for stones at higher levels, she will check with oncology  4.  Anxiety.  Continue celexa  5.  Overweight.  Lifestyle and dietary measures.  Portion control reiterated.  6.  Endometrial ca.  Per Dr SSheral Flow 7.  Breast ca.  Per Dr SMeryl Crutchand Dr RMariea Clonts 8.  Possible neuropathy from chemo.  Gabapentin   9.  Venous disease.  She will elevate, declined diuretic      RTC 9/23    Above conditions discussed at length and patient vocalized understanding.  All questions answered to patient satisfaction     Diagnosis Orders   1. Advanced care planning/counseling discussion        2. Controlled type 2 diabetes mellitus without complication, without long-term current use of insulin (HCC)  Microalbumin / Creatinine Urine Ratio    Comprehensive Metabolic Panel    HEMOGLOBIN A1C W/O EAG      3. [place] drug [place]        4. Dyslipidemia  Lipid Panel    CBC with Auto Differential      5. Osteoarthritis, unspecified osteoarthritis type, unspecified site        6. Vitamin D deficiency  Vitamin D 25 Hydroxy      7. History of breast cancer

## 2022-01-13 NOTE — Progress Notes (Signed)
Medicare Annual Wellness Visit    Laura Roman is here for Medicare AWV, Discuss Labs (01-06-22), and Diabetes    Assessment & Plan   Advanced care planning/counseling discussion  Controlled type 2 diabetes mellitus without complication, without long-term current use of insulin ([place])  -     Microalbumin / Creatinine Urine Ratio; Future  -     Comprehensive Metabolic Panel; Future  -     HEMOGLOBIN A1C W/O EAG; Future  Neuropathy due to chemotherapeutic drug ([place])  Dyslipidemia  -     Lipid Panel; Future  -     CBC with Auto Differential; Future  Osteoarthritis, unspecified osteoarthritis type, unspecified site  Vitamin D deficiency  -     Vitamin D 25 Hydroxy; Future  History of breast cancer  Medicare annual wellness visit, subsequent      Recommendations for Preventive Services Due: see orders and patient instructions/AVS.  Recommended screening schedule for the next 5-10 years is provided to the patient in written form: see Patient Instructions/AVS.     Return for Medicare Annual Wellness Visit in 1 year.     Subjective       Patient's complete Health Risk Assessment and screening values have been reviewed and are found in Flowsheets. The following problems were reviewed today and where indicated follow up appointments were made and/or referrals ordered.    Positive Risk Factor Screenings with Interventions:                                       Objective   Vitals:    01/13/22 0937   BP: 119/68   Pulse: 84   Resp: 17   Temp: 97.6 ??F (36.4 ??C)   TempSrc: Temporal   SpO2: 97%   Weight: 161 lb (73 kg)   Height: '5\' 4"'$  (1.626 m)      Body mass index is 27.64 kg/m??.               Allergies   Allergen Reactions    Fish Oil Nausea Only     Patient denies  Other reaction(s): gi distress  Patient denies    Lidocaine Other (See Comments)     anxious    Metformin Diarrhea    Nabumetone Nausea Only    Thimerosal Hives     Prior to Visit Medications    Medication Sig Taking? Authorizing Provider   Cholecalciferol (VITAMIN  D3) 1.25 MG (50000 UT) CAPS Take 1 capsule by mouth once a week Yes Historical Provider, MD   Lancets MISC Use daily as directed Yes Ar Automatic Reconciliation   aspirin 81 MG chewable tablet Take 81 mg by mouth daily Yes Ar Automatic Reconciliation   atorvastatin (LIPITOR) 40 MG tablet take 1 tablet by mouth once daily Yes Ar Automatic Reconciliation   citalopram (CELEXA) 10 MG tablet take 1 tablet by mouth once daily Yes Ar Automatic Reconciliation   gabapentin (NEURONTIN) 300 MG capsule Take 300 mg by mouth daily. Yes Ar Automatic Reconciliation   lansoprazole (PREVACID) 30 MG delayed release capsule take 1 capsule by mouth BEFORE BREAKFAST DAILY Yes Ar Automatic Reconciliation   SITagliptin (JANUVIA) 100 MG tablet take 1 tablet by mouth once daily Yes Ar Automatic Reconciliation       CareTeam (Including outside providers/suppliers regularly involved in providing care):   Patient Care Team:  Albin Felling, MD as PCP - General  Nguyet Mercer Jene Every, MD as PCP - Empaneled Provider     Reviewed and updated this visit:  Tobacco   Allergies   Meds   Problems   Med Hx   Surg Hx   Soc Hx   Fam Hx               Laura [place] Jene Every, MD

## 2022-01-13 NOTE — Progress Notes (Signed)
Laura Roman presents today for   Chief Complaint   Patient presents with    Medicare AWV    Discuss Labs     01-06-22    Diabetes     1. "Have you been to the ER, urgent care clinic since your last visit?  Hospitalized since your last visit?" no    2. "Have you seen or consulted any other health care providers outside of the [place] since your last visit?" no     3. For patients aged 71-75: Has the patient had a colonoscopy / FIT/ Cologuard? Yes - no Care Gap present      If the patient is female:    4. For patients aged 89-74: Has the patient had a mammogram within the past 2 years? Yes - no Care Gap present      5. For patients aged 21-65: Has the patient had a pap smear? NA - based on age or sex

## 2022-01-14 DIAGNOSIS — G4733 Obstructive sleep apnea (adult) (pediatric): Secondary | ICD-10-CM | POA: Diagnosis not present

## 2022-01-14 NOTE — Procedures (Signed)
? ? ? ?  Patient Name: Mia [name], Mia [name] ?Study Date: 01/11/2022 ?Gender: Female ?D.O.B: September 02, 1951 ?Age (years): 40 ?Referring Provider: Baltazar Apo ?Height (inches): 66 ?Interpreting Physician: Chesley Mires MD, ABSM ?Weight (lbs): 170 ?RPSGT: Steffey, Lennette Bihari ?BMI: 27 ?MRN: [id number] ?Neck Size: 17.00 ? ?CLINICAL INFORMATION ?Sleep Study Type: NPSG ? ?Indication for sleep study: Hypertension, OSA ? ?Epworth Sleepiness Score: 3 ? ?SLEEP STUDY TECHNIQUE ?As per the AASM Manual for the Scoring of Sleep and Associated Events v2.3 (April 2016) with a hypopnea requiring 4% desaturations. ? ?The channels recorded and monitored were frontal, central and occipital EEG, electrooculogram (EOG), submentalis EMG (chin), nasal and oral airflow, thoracic and abdominal wall motion, anterior tibialis EMG, snore microphone, electrocardiogram, and pulse oximetry. ? ?MEDICATIONS ?Medications self-administered by patient taken the night of the study : promethazine -DM, MELATONIN ? ?SLEEP ARCHITECTURE ?The study was initiated at 11:10:45 PM and ended at 5:12:11 AM. ? ?Sleep onset time was 9.9 minutes and the sleep efficiency was 75.1%%. The total sleep time was 271.5 minutes. ? ?Stage REM latency was 78.0 minutes. ? ?The patient spent 7.4%% of the night in stage N1 sleep, 65.6%% in stage N2 sleep, 0.0%% in stage N3 and 27.1% in REM. ? ?Alpha intrusion was absent. ? ?Supine sleep was 0.00%. ? ?RESPIRATORY PARAMETERS ?The overall apnea/hypopnea index (AHI) was 0.9 per hour. There were 2 total apneas, including 2 obstructive, 0 central and 0 mixed apneas. There were 2 hypopneas and 10 RERAs. ? ?The AHI during Stage REM sleep was 0.0 per hour. ? ?AHI while supine was N/A per hour. ? ?The mean oxygen saturation was 91.8%. The minimum SpO2 during sleep was 88.0%. ? ?soft snoring was noted during this study. ? ?CARDIAC DATA ?The 2 lead EKG demonstrated sinus rhythm. The mean heart rate was 57.8 beats per minute. Other EKG findings include:  None. ? ?LEG MOVEMENT DATA ?The total PLMS were 0 with a resulting PLMS index of 0.0. Associated arousal with leg movement index was 0.0 . ? ?IMPRESSIONS ?- No significant obstructive sleep apnea occurred during this study (AHI = 0.9/h). ?- The patient had minimal or no oxygen desaturation during the study (Min O2 = 88.0%) ?- The patient snored with soft snoring volume. ? ?DIAGNOSIS ?- Snoring. ? ?RECOMMENDATIONS ?- Avoid alcohol, sedatives and other CNS depressants that may worsen sleep apnea and disrupt normal sleep architecture. ?- Sleep hygiene should be reviewed to assess factors that may improve sleep quality. ?- Weight management and regular exercise should be initiated or continued if appropriate. ? ?[Electronically signed] 01/14/2022 09:08 AM ? ?Chesley Mires MD, ABSM ?Diplomate, Tax adviser of Sleep Medicine ?NPI: [id number] ? ?[place] ?PH: [phone]   FX: [phone] ?ACCREDITED BY THE AMERICAN ACADEMY OF SLEEP MEDICINE ? ?

## 2022-01-15 ENCOUNTER — Other Ambulatory Visit: Payer: Self-pay

## 2022-01-15 ENCOUNTER — Ambulatory Visit (INDEPENDENT_AMBULATORY_CARE_PROVIDER_SITE_OTHER): Payer: Medicare Other | Admitting: Family Medicine

## 2022-01-15 VITALS — BP 135/64 | HR 58

## 2022-01-15 DIAGNOSIS — E538 Deficiency of other specified B group vitamins: Secondary | ICD-10-CM | POA: Diagnosis not present

## 2022-01-15 MED ORDER — CYANOCOBALAMIN 1000 MCG/ML IJ SOLN
1000.0000 ug | Freq: Once | INTRAMUSCULAR | Status: AC
  Administered 2022-01-15: 1000 ug via INTRAMUSCULAR

## 2022-01-15 NOTE — Progress Notes (Signed)
Patient is here for a vitamin B12 injection. Denies gastrointestinal problems or dizziness.  ? ?Patient had last B12 level drawn in July and was to have redrawn in 4 months. Patient had level drawn today prior to injection.  ? ?B12 injection to RUOQ per patient request with no apparent complications. Patient advised to schedule next injection in 30 days.  ? ?

## 2022-01-15 NOTE — Progress Notes (Signed)
Agree with documentation as above. Agree lets check B12.  ? ?Mia Lecher, MD ? ?

## 2022-01-16 LAB — VITAMIN B12: Vitamin B-12: 553 pg/mL (ref 200–1100)

## 2022-01-16 NOTE — Progress Notes (Signed)
12 levels are looking good!  Continue with current protocol.

## 2022-01-20 ENCOUNTER — Other Ambulatory Visit: Payer: Self-pay | Admitting: *Deleted

## 2022-01-20 ENCOUNTER — Ambulatory Visit: Payer: Medicare Other | Admitting: Family Medicine

## 2022-01-20 DIAGNOSIS — Z79899 Other long term (current) drug therapy: Secondary | ICD-10-CM | POA: Diagnosis not present

## 2022-01-20 DIAGNOSIS — R7989 Other specified abnormal findings of blood chemistry: Secondary | ICD-10-CM | POA: Diagnosis not present

## 2022-01-20 NOTE — Progress Notes (Deleted)
? ?Established Patient Office Visit ? ?Subjective:  ?Patient ID: Mia [name], female    DOB: 1951-04-01  Age: 71 y.o. MRN: [id number] ? ?CC: No chief complaint on file. ? ? ?HPI ?Cristy Friedlander Weygandt presents for  ? ?Hypertension- Pt denies chest pain, SOB, dizziness, or heart palpitations.  Taking meds as directed w/o problems.  Denies medication side effects.   ? ? ?Impaired fasting glucose-no increased thirst or urination. No symptoms consistent with hypoglycemia. ? ? ?Past Medical History:  ?Diagnosis Date  ? Anal fissure   ? Anemia   ? Arthritis   ? Blood transfusion   ? C. difficile colitis   ? Chest pain   ? a. 01/2013 MV: EF 59%, no ischemia.  ? Chronic Dyspnea   ? a. 01/2013 Echo: EF 60-65%, Gr 1 DD, PASP 58mHg.// Echo 01/2020: EF 60-65, no RWMA, GR 1 DD, GLS -21.6%, normal RV SF, trivial MR   ? COVID-19   ? DDD (degenerative disc disease), cervical   ? DDD (degenerative disc disease), lumbar   ? Diabetes mellitus   ? Fibromyalgia   ? Gall stones   ? GERD (gastroesophageal reflux disease)   ? gastritis  ? Hemorrhoids   ? Hypertension   ? Interstitial cystitis   ? MCI (mild cognitive impairment) 11/25/2017  ? Memory difficulty 12/14/2013  ? Neuromuscular disorder ([facility]   ? sclerosis  ? Osteoporosis   ? Peptic ulcer   ? PONV (postoperative nausea and vomiting)   ? Pseudogout   ? Raynaud phenomenon   ? Rectal bleeding   ? Sleep apnea   ? a. on cpap.  ? [place]   ? Syncope 09/10/2015  ? Thyroid disease   ? hypothyroidism  ? Tremor, essential 05/14/2016  ? ? ?Past Surgical History:  ?Procedure Laterality Date  ? APPENDECTOMY    ? BLADDER SURGERY    ? x2  ? BLADDER SURGERY    ? BREAST BIOPSY    ? CARDIAC CATHETERIZATION    ? CARPAL TUNNEL RELEASE    ? CATARACT EXTRACTION Bilateral   ? CHOLECYSTECTOMY    ? DILATION AND CURETTAGE OF UTERUS    ? ENTEROCELE REPAIR    ? x2  ? EYE SURGERY    ? retina  ? herniated disc  11/25/2021  ? L4 and L5  ? hysterectomy - unknown type    ? JOINT REPLACEMENT     ? KNEE ARTHROPLASTY    ? KNEE SURGERY    ? x6  ? NECK SURGERY    ? fusion  ? OOPHORECTOMY    ? QUADRICEPS REPAIR Right   ? RECTOCELE REPAIR    ? x2  ? SHOULDER ARTHROSCOPY WITH SUBACROMIAL DECOMPRESSION, ROTATOR CUFF REPAIR AND BICEP TENDON REPAIR  10/06/2012  ? Procedure: SHOULDER ARTHROSCOPY WITH SUBACROMIAL DECOMPRESSION, ROTATOR CUFF REPAIR AND BICEP TENDON REPAIR;  Surgeon: JNita Sells MD;  [facility]  Service: Orthopedics;  Laterality: Right;  Arthroscopic  Repair  of  Subscapularis, Open Biceps Tenodesis  ? SHOULDER SURGERY    ? bilateral- bones spur  ? SHOULDER SURGERY Left 04/15/2021  ? TONSILLECTOMY    ? TOTAL KNEE ARTHROPLASTY    ? TOTAL SHOULDER ARTHROPLASTY    ? ? ?Family History  ?Problem Relation Age of Onset  ? Heart attack Mother   ? Stroke Mother   ? Diabetes Mother   ? Heart disease Mother   ? Colon polyps Mother   ? Asthma Mother   ?  Dementia Mother   ? Heart disease Father   ? Heart attack Father   ? Asthma Father   ? Stroke Sister   ? Hypertension Sister   ? Rheum arthritis Sister   ? Dementia Sister   ? Asthma Sister   ? Lupus Sister   ? Asthma Sister   ? Breast cancer Maternal Grandmother   ? Wilson's disease Maternal Grandmother   ? Pancreatic cancer Maternal Grandfather   ? ? ?Social History  ? ?Socioeconomic History  ? Marital status: Married  ?  Spouse name: Jenny Reichmann "[name]"  ? Number of children: 2  ? Years of education: 63  ? Highest education level: Associate degree: academic program  ?Occupational History  ? Occupation: Retired  ?Tobacco Use  ? Smoking status: Never  ? Smokeless tobacco: Never  ?Vaping Use  ? Vaping Use: Never used  ?Substance and Sexual Activity  ? Alcohol use: No  ?  Alcohol/week: 0.0 standard drinks  ? Drug use: No  ? Sexual activity: Not on file  ?Other Topics Concern  ? Not on file  ?Social History Narrative  ? Lives with her husband. She likes to hang out with her friends and play games and do bible study.  ? ?Social  Determinants of Health  ? ?Financial Resource Strain: Low Risk   ? Difficulty of Paying Living Expenses: Not hard at all  ?Food Insecurity: No Food Insecurity  ? Worried About Charity fundraiser in the Last Year: Never true  ? Ran Out of Food in the Last Year: Never true  ?Transportation Needs: No Transportation Needs  ? Lack of Transportation (Medical): No  ? Lack of Transportation (Non-Medical): No  ?Physical Activity: Inactive  ? Days of Exercise per Week: 0 days  ? Minutes of Exercise per Session: 0 min  ?Stress: No Stress Concern Present  ? Feeling of Stress : Not at all  ?Social Connections: Socially Integrated  ? Frequency of Communication with Friends and Family: More than three times a week  ? Frequency of Social Gatherings with Friends and Family: More than three times a week  ? Attends Religious Services: More than 4 times per year  ? Active Member of Clubs or Organizations: Yes  ? Attends Archivist Meetings: More than 4 times per year  ? Marital Status: Married  ?Intimate Partner Violence: Not At Risk  ? Fear of Current or Ex-Partner: No  ? Emotionally Abused: No  ? Physically Abused: No  ? Sexually Abused: No  ? ? ?Outpatient Medications Prior to Visit  ?Medication Sig Dispense Refill  ? cetirizine (ZYRTEC) 10 MG tablet Take 1 tablet by mouth in the morning.    ? colestipol (COLESTID) 1 g tablet Take 1 g by mouth 2 (two) times daily.    ? Cyanocobalamin (VITAMIN B-12 IJ) Inject 1 mL as directed every 30 (thirty) days.     ? donepezil (ARICEPT) 10 MG tablet TAKE ONE TABLET BY MOUTH EVERY NIGHT AT BEDTIME 90 tablet 3  ? gabapentin (NEURONTIN) 300 MG capsule TAKE 1 TO 2 CAPSULES BY MOUTH UP TO TWO TIMES DAILY AS NEEDED (Patient taking differently: Take 300 mg by mouth 2 (two) times daily.) 180 capsule 3  ? ibuprofen (ADVIL) 600 MG tablet ibuprofen 600 mg tablet ? TAKE 1 TABLET BY MOUTH EVERY 6 HOURS AS NEEDED    ? ipratropium (ATROVENT) 0.03 % nasal spray Place 2 sprays into both nostrils every  12 (twelve) hours. 30 mL 1  ? leflunomide (  ARAVA) 20 MG tablet TAKE ONE TABLET BY MOUTH DAILY 90 tablet 0  ? losartan (COZAAR) 50 MG tablet TAKE 1 TABLET BY MOUTH IN THE MORNING AND 1 TABLET IN EVENING AS NEEDED FOR BLOOD PRESSURE OVER 150 180 tablet 0  ? Melatonin 10 MG CAPS Take 10 capsules by mouth daily.    ? memantine (NAMENDA) 10 MG tablet Take 1 tablet (10 mg total) by mouth 2 (two) times daily. 180 tablet 4  ? metoprolol tartrate (LOPRESSOR) 100 MG tablet TAKE 1 AND 1/2 TABLETS TWICE DAILY 45 tablet 0  ? ondansetron (ZOFRAN) 4 MG tablet Take by mouth.    ? ondansetron (ZOFRAN-ODT) 8 MG disintegrating tablet Take 1 tablet by mouth as needed for nausea.    ? pantoprazole (PROTONIX) 40 MG tablet Take 40 mg by mouth 2 (two) times daily.    ? polyethylene glycol powder (GLYCOLAX/MIRALAX) 17 GM/SCOOP powder Take 17 g by mouth daily as needed for mild constipation or moderate constipation.     ? primidone (MYSOLINE) 50 MG tablet TAKE 1 TABLET AT BEDTIME 90 tablet 1  ? venlafaxine XR (EFFEXOR-XR) 75 MG 24 hr capsule TAKE ONE CAPSULE BY MOUTH DAILY WITH BREAFAST 90 capsule 3  ? ?No facility-administered medications prior to visit.  ? ? ?Allergies  ?Allergen Reactions  ? Codeine Nausea And Vomiting  ? Myrbetriq  [Mirabegron] Other (See Comments)  ?  SEVERE HEADACHE  ? Nystatin   ? Percocet [Oxycodone-Acetaminophen] Nausea And Vomiting  ? Propoxyphene Nausea And Vomiting  ? Celebrex [Celecoxib] Other (See Comments)  ?  Other reaction(s): Other ?flushed  ? Darvocet [Propoxyphene N-Acetaminophen] Nausea And Vomiting  ? Erythromycin Nausea And Vomiting  ?  Nausea and vomiting ?  ? Hydrocodone Nausea And Vomiting  ? Lyrica [Pregabalin] Swelling  ?  Legs swelling ?  ? Macrodantin [Nitrofurantoin] Nausea And Vomiting  ? Toradol [Ketorolac Tromethamine] Nausea And Vomiting  ?  Per patient, only PO form causes nausea and vomiting. Can take injection without issue  ? Tramadol Nausea And Vomiting  ? Verapamil Nausea And Vomiting   ?  REACTION: intolerance  ? ? ?ROS ?Review of Systems ? ?  ?Objective:  ?  ?Physical Exam ? ?There were no vitals taken for this visit. ?Wt Readings from Last 3 Encounters:  ?01/11/22 170 lb (77.1 kg)  ?03/1

## 2022-01-21 LAB — ALT: ALT: 22 U/L (ref 6–29)

## 2022-01-21 LAB — AST: AST: 20 U/L (ref 10–35)

## 2022-01-21 NOTE — Progress Notes (Signed)
AST and ALT are normal now.

## 2022-01-26 DIAGNOSIS — M25512 Pain in left shoulder: Secondary | ICD-10-CM | POA: Diagnosis not present

## 2022-02-04 ENCOUNTER — Other Ambulatory Visit: Payer: Self-pay | Admitting: Physician Assistant

## 2022-02-04 NOTE — Telephone Encounter (Signed)
Next Visit: 05/19/2022 ? ?Last Visit: 12/18/2021 ? ?Last Fill: 11/06/2021 ? ?DX: Seronegative rheumatoid arthritis  ? ?Current Dose per office note 12/18/2021: Arava 20 mg 1 tablet by mouth daily ? ?Labs: 12/18/2021 CBC is normal, ALT is elevated. 12/15/2021 BMP WNL ? ?Okay to refill Arava?  ?

## 2022-02-06 ENCOUNTER — Ambulatory Visit (INDEPENDENT_AMBULATORY_CARE_PROVIDER_SITE_OTHER): Payer: Medicare Other | Admitting: Emergency Medicine

## 2022-02-06 ENCOUNTER — Encounter: Payer: Self-pay | Admitting: Emergency Medicine

## 2022-02-06 DIAGNOSIS — G4733 Obstructive sleep apnea (adult) (pediatric): Secondary | ICD-10-CM

## 2022-02-06 DIAGNOSIS — R0602 Shortness of breath: Secondary | ICD-10-CM

## 2022-02-06 NOTE — Assessment & Plan Note (Signed)
Her PFT, CPX, methacholine challenge were all reassuring in the past.  She does get some subjective benefit from albuterol and its okay for her to continue it if needed.  She had a recent diagnosis of right CAP that was treated, still has some fatigue.  If she continues to have short windedness then I think repeat pulmonary function testing would be indicated.  We can discuss next time. ?

## 2022-02-06 NOTE — Patient Instructions (Addendum)
Go ahead and stop your CPAP for now.  Keep track of how your sleep does, how you are daytime energy does. ?Keep albuterol available to use 2 puffs if needed for shortness of breath, chest tightness, wheezing. ?Depending on how your breathing progresses we may decide to repeat your pulmonary function testing at some point going forward. ?Follow with Dr Lamonte Sakai in 6 months or sooner if you have any problems ? ?

## 2022-02-06 NOTE — Progress Notes (Signed)
?History of Present Illness:  ?71 yo woman never smoker with hx paroxismal SVT, Holter showed correlation at the time with exertional dyspnea and lightheadedness. PFT, methacholine and CPEX were reassuring except exercise limited by tachycardia. Underwent CPAP titration and needed 14cm H2O pressure  ? ?ROV 12/11/2021 --follow-up visit 71 year old woman whom I have followed for shortness of breath and obstructive sleep apnea for which she is on CPAP.  PMH significant for PSVT.  Her pulmonary function testing, methacholine and CPX have all been reassuring except for tachycardia. ?Today she reports that she has good compliance w CPAP, has been dealing w dryness even though she is using humidity. Using full face mask. Good compliance, wears every night. She believes she gets benefit > no HA in am, less daytime sleepiness. Her device is making a weird noise when it is running. She is wondering about a hypoglossal nerve stimulator, whether this could be an option for her.  ?Her functional capacity is limited by dyspnea - walking, even showering sometimes. She had an episode that was was treated as asthmatic bronchitis a few month ago in setting of URI. She was treated transiently w Advair and albuterol - did feel that it helped her.  ? ?CT-PA done 02/22/2020 reviewed by me shows no evidence of PE.  Some mild dependent atelectasis without any other significant abnormality.  No mediastinal or hilar adenopathy. ? ?Echocardiogram 03/04/2020 reviewed, showed normal LVEF 60-65% with mild LV hypertrophy and normal diastolic function.  The RV is normal size and function.  PASP normal. ? ? ?ROV 02/06/22 --follow-up visit for 71 year old woman for obstructive sleep apnea.  She also has a history of paroxysmal SVT.  She has had reassuring PFT and methacholine challenge, CPX in the past.  She has had intermittent bronchitic symptoms and has albuterol available to use if needed.  I have not supported having her on scheduled  bronchodilator therapy.  We were concerned that she may need a new CPAP machine so we repeated her PSG.  This was done 01/11/2022 as below. ?She is on Zyrtec, Atrovent nasal spray, Protonix ?She was treated for RLL CAP in march, treated w abx and steroids at [facility]. CXR was done, but I do not have access. Still fatigued quite a bit. Used some albuterol - does intermittently help her.  ? ?PSG 01/11/2022 showed an overall AHI 0.9/h, mean oxygen saturation 92% with a minimum of 88% and soft snoring.  No CPAP was recommended. ? ? ?Vitals:  ? 02/06/22 1024  ?BP: 130/72  ?Pulse: 66  ?Temp: 97.6 ?F (36.4 ?C)  ?TempSrc: Oral  ?SpO2: 96%  ?Weight: 175 lb (79.4 kg)  ?Height: '5\' 6"'$  (1.676 m)  ? ? ? ?Gen: Pleasant, well-nourished, in no distress,  normal affect ? ?ENT: No lesions,  mouth clear,  oropharynx clear, no postnasal drip ? ?Neck: No JVD, no stridor ? ?Lungs: No use of accessory muscles, clear without rales or rhonch. ? ?Cardiovascular: RRR, heart sounds normal, no murmur or gallops, no peripheral edema ? ?Musculoskeletal: No deformities, no cyanosis or clubbing ? ?Neuro: alert, non focal ? ?Skin: Warm, no lesions or rashes ? ? ? ? ?OSA (obstructive sleep apnea) ?Her PSG did not show any sleep apnea.  We will do a trial off CPAP to see how she does.  On discussion today it appears that a lot of her sleep symptoms in the past were nighttime awakenings.  May need to have her see sleep medicine if this starts to return. ? ?Dyspnea ?Her PFT,  CPX, methacholine challenge were all reassuring in the past.  She does get some subjective benefit from albuterol and its okay for her to continue it if needed.  She had a recent diagnosis of right CAP that was treated, still has some fatigue.  If she continues to have short windedness then I think repeat pulmonary function testing would be indicated.  We can discuss next time. ? ? ?Baltazar Apo, MD, PhD ?02/06/2022, 10:40 AM ?[facility] ?[phone] or if no answer  [phone] ? ? ?

## 2022-02-06 NOTE — Assessment & Plan Note (Signed)
Her PSG did not show any sleep apnea.  We will do a trial off CPAP to see how she does.  On discussion today it appears that a lot of her sleep symptoms in the past were nighttime awakenings.  May need to have her see sleep medicine if this starts to return. ?

## 2022-02-18 NOTE — Progress Notes (Addendum)
? ?Established Patient Office Visit ? ?Subjective   ?Patient ID: Mia [name], female    DOB: 1951-01-31  Age: 71 y.o. MRN: [id number] ? ?Chief Complaint  ?Patient presents with  ? Follow-up  ?  6 month for B12   ? Impaired Fasting Glucose   ?  Follow up   ? Fatigue  ?  Sleeping a lot for 1 month since having Pneumonia.   ? Tremors  ?  Patient states Primidone is helping but is still having some tremors in hands.   ? Mammogram  ?  Patient will call to schedule appt.   ? ? ?HPI ? ?Impaired fasting glucose-no increased thirst or urination. No symptoms consistent with hypoglycemia. ? ?F/U B12 anemia - Patient is here for a vitamin B12 injection. ? ?F/U Fatigue - Sleeping a lot for 1 month since having Pneumonia. ? ?Tremor - Patient states Primidone is helping but is still having some tremors in hands.  She says she notices it for example when she is trying to pay bills or do something more fine motor coordinated.  But most days it actually is really well controlled.  Not noticed any side effects on the medication. ? ?Osteoporosis-Dr. Gayla Medicus for is changing her over to Reclast yearly infusion. ? ?Reports that gastroenterology, Dr. Cindee Salt started her on a really low-dose of amitriptyline at bedtime to help with the intermittent right upper quadrant spasms that she was getting related to her gallbladder.  They feel like that might even have been triggering her fluctuations in liver enzymes.  She says she has noticed that its been helpful.  It has not completely gotten rid of her symptoms but they are not nearly as intense. ? ?She says she still feels like she is getting over her recent diagnosis of pneumonia.  She had bronchitis initially and then diagnosed with pneumonia.  She still just felt really tired over the last month. ? ? ? ? ?ROS ? ?  ?Objective:  ?  ? ?BP 108/60   Pulse 61   Resp 16   Ht '5\' 6"'$  (1.676 m)   Wt 176 lb (79.8 kg)   SpO2 98%   BMI 28.41 kg/m?  ? ? ?Physical Exam ?Vitals and nursing note reviewed.   ?Constitutional:   ?   Appearance: She is well-developed.  ?HENT:  ?   Head: Normocephalic and atraumatic.  ?Cardiovascular:  ?   Rate and Rhythm: Normal rate and regular rhythm.  ?   Heart sounds: Normal heart sounds.  ?Pulmonary:  ?   Effort: Pulmonary effort is normal.  ?   Breath sounds: Normal breath sounds.  ?Skin: ?   General: Skin is warm and dry.  ?Neurological:  ?   Mental Status: She is alert and oriented to person, place, and time.  ?Psychiatric:     ?   Behavior: Behavior normal.  ? ? ? ?Results for orders placed or performed in visit on 02/19/22  ?POCT HgB A1C  ?Result Value Ref Range  ? Hemoglobin A1C 5.5 4.0 - 5.6 %  ? HbA1c POC (<> result, manual entry)    ? HbA1c, POC (prediabetic range)    ? HbA1c, POC (controlled diabetic range)    ? ? ? ? ?The 10-year ASCVD risk score (Arnett DK, et al., 2019) is: 16.6% ? ?  ?Assessment & Plan:  ? ?Problem List Items Addressed This Visit   ? ?  ? Cardiovascular and Mediastinum  ? Hypertension  ?  Blood pressure at goal  in fact slightly on the low end today.  She says she has noticed a few other low blood pressures as well so we discussed cutting her losartan in half to take 25 mg total for the next 2 weeks and track her blood pressure at home.  Normally she runs in the 130s.  ? ?  ?  ? Relevant Medications  ? losartan (COZAAR) 50 MG tablet  ? Chronic diastolic CHF (congestive heart failure) ([place])  ?  Controlled.  Asymptomatic. ? ?  ?  ? Relevant Medications  ? losartan (COZAAR) 50 MG tablet  ?  ? Endocrine  ? IFG (impaired fasting glucose)  ?  A1c looks phenomenal today.  Down to 5.5, from previous of 5.8.  She is doing a great job.  She is very interested in trying to work on weight loss as well.  She did have some questions about some over-the-counter products as well.  Plan to recheck again in 6 to 12 months. ? ?  ?  ? Relevant Orders  ? POCT HgB A1C (Completed)  ?  ? Nervous and Auditory  ? Tremor, essential  ?  Doing well on primidone overall.  She just  has occasional days where it seems a little bit more exacerbated.  Okay to take an extra half a tab on those days as needed.  Just encouraged her to keep track of how often she takes an extra half a tab.  If it becomes more frequent then we can always adjust her dose if needed but I think she is getting phenomenal results on the 50 mg daily.  No evidence of tremor on exam today in her hands or in her head. ? ?  ?  ?  ? Other  ? B12 deficiency - Primary  ?  Monthly B12 injection administered today. ? ?  ?  ? ? ?Follow-up pneumonia-discussed that the fatigue can actually last for weeks to months even.  But lungs are clear on exam today which is very reassuring. ? ?Return in about 6 months (around 08/21/2022) for Hypertension.  ? ?I spent 45 minutes on the day of the encounter to include pre-visit record review, face-to-face time with the patient and post visit ordering of test. ? ? ?[name], MD ? ?

## 2022-02-19 ENCOUNTER — Ambulatory Visit (INDEPENDENT_AMBULATORY_CARE_PROVIDER_SITE_OTHER): Payer: Medicare Other | Admitting: Family Medicine

## 2022-02-19 ENCOUNTER — Encounter: Payer: Self-pay | Admitting: Family Medicine

## 2022-02-19 VITALS — BP 108/60 | HR 61 | Resp 16 | Ht 66.0 in | Wt 176.0 lb

## 2022-02-19 DIAGNOSIS — I10 Essential (primary) hypertension: Secondary | ICD-10-CM

## 2022-02-19 DIAGNOSIS — I5032 Chronic diastolic (congestive) heart failure: Secondary | ICD-10-CM

## 2022-02-19 DIAGNOSIS — E538 Deficiency of other specified B group vitamins: Secondary | ICD-10-CM | POA: Diagnosis not present

## 2022-02-19 DIAGNOSIS — G250 Essential tremor: Secondary | ICD-10-CM

## 2022-02-19 DIAGNOSIS — R7301 Impaired fasting glucose: Secondary | ICD-10-CM

## 2022-02-19 LAB — POCT GLYCOSYLATED HEMOGLOBIN (HGB A1C): Hemoglobin A1C: 5.5 % (ref 4.0–5.6)

## 2022-02-19 MED ORDER — CYANOCOBALAMIN 1000 MCG/ML IJ SOLN
1000.0000 ug | Freq: Once | INTRAMUSCULAR | Status: AC
  Administered 2022-02-19: 1000 ug via INTRAMUSCULAR

## 2022-02-19 MED ORDER — LOSARTAN POTASSIUM 50 MG PO TABS: 50.0000 mg | ORAL_TABLET | Freq: Every day | ORAL | 0 refills | Status: DC

## 2022-02-19 NOTE — Assessment & Plan Note (Signed)
Doing well on primidone overall.  She just has occasional days where it seems a little bit more exacerbated.  Okay to take an extra half a tab on those days as needed.  Just encouraged her to keep track of how often she takes an extra half a tab.  If it becomes more frequent then we can always adjust her dose if needed but I think she is getting phenomenal results on the 50 mg daily.  No evidence of tremor on exam today in her hands or in her head. ?

## 2022-02-19 NOTE — Progress Notes (Signed)
Pt came in today for B12 injection. Injection given in LUOQ tolerated well.  ?Pt reports no negative side effects from medication. Denies any dizziness, chest pain or palpitations, and no GI problems.. she will RTC in 30 days for next injection. ?

## 2022-02-19 NOTE — Assessment & Plan Note (Signed)
A1c looks phenomenal today.  Down to 5.5, from previous of 5.8.  She is doing a great job.  She is very interested in trying to work on weight loss as well.  She did have some questions about some over-the-counter products as well.  Plan to recheck again in 6 to 12 months. ?

## 2022-02-19 NOTE — Assessment & Plan Note (Addendum)
Blood pressure at goal in fact slightly on the low end today.  She says she has noticed a few other low blood pressures as well so we discussed cutting her losartan in half to take 25 mg total for the next 2 weeks and track her blood pressure at home.  Normally she runs in the 130s.  ?

## 2022-02-19 NOTE — Assessment & Plan Note (Signed)
Monthly B12 injection administered today. ?

## 2022-02-19 NOTE — Assessment & Plan Note (Signed)
Controlled.  Asymptomatic. ?

## 2022-02-19 NOTE — Patient Instructions (Signed)
Okay to take an extra half a tab of primidone as needed.  Keep general track of how often you are using an extra tab.  If it becomes frequent then please let me know.

## 2022-02-23 ENCOUNTER — Ambulatory Visit: Payer: Medicare Other | Admitting: Adult Health

## 2022-02-23 ENCOUNTER — Other Ambulatory Visit: Payer: Self-pay | Admitting: Physician Assistant

## 2022-02-23 DIAGNOSIS — Z1231 Encounter for screening mammogram for malignant neoplasm of breast: Secondary | ICD-10-CM

## 2022-02-23 DIAGNOSIS — Z780 Asymptomatic menopausal state: Secondary | ICD-10-CM

## 2022-02-24 ENCOUNTER — Ambulatory Visit: Payer: Medicare Other | Admitting: Family Medicine

## 2022-03-05 ENCOUNTER — Ambulatory Visit: Payer: Medicare Other | Admitting: Adult Health

## 2022-03-24 ENCOUNTER — Other Ambulatory Visit: Payer: Self-pay | Admitting: *Deleted

## 2022-03-24 ENCOUNTER — Ambulatory Visit: Payer: Medicare Other

## 2022-03-24 DIAGNOSIS — Z79899 Other long term (current) drug therapy: Secondary | ICD-10-CM | POA: Diagnosis not present

## 2022-03-25 ENCOUNTER — Ambulatory Visit (INDEPENDENT_AMBULATORY_CARE_PROVIDER_SITE_OTHER): Payer: Medicare Other | Admitting: Family Medicine

## 2022-03-25 VITALS — Temp 97.9°F | Ht 66.0 in | Wt 179.0 lb

## 2022-03-25 DIAGNOSIS — E538 Deficiency of other specified B group vitamins: Secondary | ICD-10-CM

## 2022-03-25 LAB — COMPLETE METABOLIC PANEL WITH GFR
AG Ratio: 1.8 (calc) (ref 1.0–2.5)
ALT: 25 U/L (ref 6–29)
AST: 28 U/L (ref 10–35)
Albumin: 4.2 g/dL (ref 3.6–5.1)
Alkaline phosphatase (APISO): 93 U/L (ref 37–153)
BUN: 9 mg/dL (ref 7–25)
CO2: 24 mmol/L (ref 20–32)
Calcium: 9.8 mg/dL (ref 8.6–10.4)
Chloride: 96 mmol/L — ABNORMAL LOW (ref 98–110)
Creat: 0.65 mg/dL (ref 0.60–1.00)
Globulin: 2.3 g/dL (calc) (ref 1.9–3.7)
Glucose, Bld: 83 mg/dL (ref 65–99)
Potassium: 4.8 mmol/L (ref 3.5–5.3)
Sodium: 131 mmol/L — ABNORMAL LOW (ref 135–146)
Total Bilirubin: 0.3 mg/dL (ref 0.2–1.2)
Total Protein: 6.5 g/dL (ref 6.1–8.1)
eGFR: 95 mL/min/{1.73_m2} (ref >=60)

## 2022-03-25 LAB — CBC WITH DIFFERENTIAL/PLATELET
Absolute Monocytes: 1050 cells/uL — ABNORMAL HIGH (ref 200–950)
Basophils Absolute: 83 cells/uL (ref 0–200)
Basophils Relative: 1.3 %
Eosinophils Absolute: 179 cells/uL (ref 15–500)
Eosinophils Relative: 2.8 %
HCT: 39.2 % (ref 35.0–45.0)
Hemoglobin: 12.5 g/dL (ref 11.7–15.5)
Lymphs Abs: 2016 cells/uL (ref 850–3900)
MCH: 29.4 pg (ref 27.0–33.0)
MCHC: 31.9 g/dL — ABNORMAL LOW (ref 32.0–36.0)
MCV: 92.2 fL (ref 80.0–100.0)
MPV: 10.1 fL (ref 7.5–12.5)
Monocytes Relative: 16.4 %
Neutro Abs: 3072 cells/uL (ref 1500–7800)
Neutrophils Relative %: 48 %
Platelets: 359 10*3/uL (ref 140–400)
RBC: 4.25 10*6/uL (ref 3.80–5.10)
RDW: 13.2 % (ref 11.0–15.0)
Total Lymphocyte: 31.5 %
WBC: 6.4 10*3/uL (ref 3.8–10.8)

## 2022-03-25 MED ORDER — CYANOCOBALAMIN 1000 MCG/ML IJ SOLN
1000.0000 ug | Freq: Once | INTRAMUSCULAR | Status: AC
  Administered 2022-03-25: 1000 ug via INTRAMUSCULAR

## 2022-03-25 NOTE — Progress Notes (Signed)
Pt is here for a vitamin B12 injection. Pt tolerated injection well without complications. Pt advised to schedule next injection in 4 weeks.   Next B12 lab due in September 2023.

## 2022-03-25 NOTE — Progress Notes (Signed)
Agree with documentation as above.   Jearldean Gutt, MD  

## 2022-03-25 NOTE — Progress Notes (Signed)
CBC and CMP are stable.

## 2022-03-31 DIAGNOSIS — I2510 Atherosclerotic heart disease of native coronary artery without angina pectoris: Secondary | ICD-10-CM | POA: Diagnosis not present

## 2022-03-31 DIAGNOSIS — R197 Diarrhea, unspecified: Secondary | ICD-10-CM | POA: Diagnosis not present

## 2022-03-31 DIAGNOSIS — D123 Benign neoplasm of transverse colon: Secondary | ICD-10-CM | POA: Diagnosis not present

## 2022-03-31 LAB — HM COLONOSCOPY

## 2022-04-01 ENCOUNTER — Telehealth: Payer: Self-pay | Admitting: Rheumatology

## 2022-04-01 NOTE — Telephone Encounter (Signed)
Patient called stating she has pain in her hips, knees, and elbows.  Patient states her left leg is so painful she has difficulty walking.  Patient states she has been taking Methocarbamol and Ibuprofen, but it isn't helping.  Patient states she is not sure if the pain is from her fibromyalgia or RA and requested to speak with Lovena Le.

## 2022-04-01 NOTE — Telephone Encounter (Signed)
I called the patient to discuss the symptoms she is currently experiencing.  She states that for the past 2 weeks she has had pain in both hips, both knees, both elbows.  She states she has not noticed any improvement as the days have gone on despite taking ibuprofen and methocarbamol.  She states she has noticed swelling in both knees including her right knee replacement.  She is having tenderness in her legs consistent with fibromyalgia.  She has also had significant pain in the left hip consistent with trochanteric bursitis.  She has had difficulty walking for long distances and laying on her left side at night.  She is also having increased brain fog and fatigue.  Today she has remained in her pajamas in her recliner due to the severity of her pain and stiffness.     Please call the patient to schedule a follow-up visit tomorrow or Friday for a left trochanteric bursa cortisone injection.

## 2022-04-01 NOTE — Telephone Encounter (Signed)
Patient advised the only appointment we have available is on 04/03/2022 at 11:20 am. Patient advised she will encounter a wait. Patient expressed understanding and accepted appointment date and time.

## 2022-04-02 NOTE — Progress Notes (Signed)
Office Visit Note  Patient: Mia [name]             Date of Birth: 1950-12-12           MRN: [id number]             PCP: Hali Marry, MD Referring: Hali Marry, * Visit Date: 04/03/2022 Occupation: '@GUAROCC'$ @  Subjective:  Generalized pain and left hip pain  History of Present Illness: Mia [name] is a 71 y.o. female with history of history of seronegative rheumatoid arthritis, osteoarthritis, degenerative disc disease and fibromyalgia syndrome.  She states she has been having a flare of fibromyalgia for the last 3 weeks with generalized pain and discomfort.  She describes pain in her entire spine, shoulders, elbows, hips, knees and ankles.  She describes pain over the left lateral epicondyle region and left trochanteric bursa.  She states the pain from the left trochanteric bursa has been radiating down into her thigh.  She is having difficulty walking.  Right total knee replacement is doing well.  Activities of Daily Living:  Patient reports morning stiffness for 30 minutes.   Patient Reports nocturnal pain.  Difficulty dressing/grooming: Denies Difficulty climbing stairs: Reports Difficulty getting out of chair: Reports Difficulty using hands for taps, buttons, cutlery, and/or writing: Denies  Review of Systems  Constitutional:  Positive for fatigue.  HENT:  Positive for mouth dryness.   Eyes:  Positive for dryness.  Respiratory:  Negative for difficulty breathing.   Cardiovascular:  Positive for swelling in legs/feet.  Gastrointestinal:  Negative for constipation.  Endocrine: Positive for heat intolerance and excessive thirst.  Genitourinary:  Negative for difficulty urinating.  Musculoskeletal:  Positive for joint pain, gait problem, joint pain, joint swelling, muscle weakness, morning stiffness and muscle tenderness.  Skin:  Negative for color change, rash and sensitivity to sunlight.  Allergic/Immunologic: Positive for susceptible to infections.   Neurological:  Positive for weakness.  Hematological:  Negative for bruising/bleeding tendency.  Psychiatric/Behavioral:  Positive for sleep disturbance.     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   Weakness of both lower extremities 06/11/2020   Hyponatremia 05/22/2020   Gait instability 06/20/2019   Facet arthritis of lumbar region 05/10/2018   MCI (mild cognitive impairment) 11/25/2017   Restrictive lung disease 07/30/2017   Mixed hyperlipidemia 06/18/2017   Rectocele 04/26/2017   Irritable bowel syndrome with both constipation and diarrhea 04/26/2017   Osteoporosis 04/18/2017   Trochanteric bursitis of both hips 02/24/2017   Stress fracture of left tibia 11/05/2016   Inflammatory arthritis 09/30/2016   High risk medication use 09/30/2016   History of Clostridium difficile colitis 09/30/2016   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   Pseudogout 09/30/2016   DDD cervical spine status post fusion 09/30/2016   DDD thoracic spine 09/30/2016   H/O total knee replacement, right 09/30/2016   Age-related osteoporosis without current pathological fracture 09/30/2016   Tremor, essential 05/14/2016   Right foot pain 04/14/2016   B12 deficiency 09/10/2015   Syncope 09/10/2015   Cervical facet joint syndrome 09/10/2015   Chronic fatigue 09/10/2015   Aortic atherosclerosis ([place]) 04/01/2015   DOE (dyspnea on exertion)    Primary osteoarthritis of right knee 08/13/2014   Benign head tremor 06/11/2014   Lumbar degenerative disc disease 06/11/2014   IFG (impaired fasting glucose) 03/09/2014   Hemorrhoid 03/09/2014  Retinal wrinkling, right eye 03/09/2014   Fibromyalgia 03/09/2014   GERD (gastroesophageal reflux disease) 03/09/2014   History of arthroplasty of right knee 01/31/2014   Memory difficulty 12/14/2013   Hemorrhoids, external,  thrombosed 06/22/2011   Hypertension 03/11/2011   SVT (supraventricular tachycardia) ([place])    Raynaud phenomenon    EDEMA 08/25/2010   Nonspecific (abnormal) findings on radiological and other examination of body structure 08/25/2010   COMPUTERIZED TOMOGRAPHY, CHEST, ABNORMAL 08/25/2010   OSA (obstructive sleep apnea) 04/24/2009   Allergic rhinitis 06/11/2008   Dyspnea 06/11/2008   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 difficile colitis    Chest pain    a. 01/2013 MV: EF 59%, no ischemia.   Chronic Dyspnea    a. 01/2013 Echo: EF 60-65%, Gr 1 DD, PASP 79mHg.// Echo 01/2020: EF 60-65, no RWMA, GR 1 DD, GLS -21.6%, normal RV SF, trivial MR    COVID-19    DDD (degenerative disc disease), cervical    DDD (degenerative disc disease), lumbar    Diabetes mellitus    Fibromyalgia    Gall stones    GERD (gastroesophageal reflux disease)    gastritis   Hemorrhoids    Hypertension    Interstitial cystitis    MCI (mild cognitive impairment) 11/25/2017   Memory difficulty 12/14/2013   Neuromuscular disorder (HCC)    sclerosis   Osteoporosis    Peptic ulcer    PONV (postoperative nausea and vomiting)    Pseudogout    Raynaud phenomenon    Rectal bleeding    Sleep apnea    a. on cpap.   SVT (supraventricular tachycardia) ([place]    Syncope 09/10/2015   Thyroid disease    hypothyroidism   Tremor, essential 05/14/2016    Family History  Problem Relation Age of Onset   Heart attack Mother    Stroke Mother    Diabetes Mother    Heart disease Mother    Colon polyps Mother    Asthma Mother    Dementia Mother    Heart disease Father    Heart attack Father    Asthma Father    Stroke Sister    Hypertension Sister    Rheum arthritis Sister    Dementia Sister     Asthma Sister    Lupus Sister    Asthma Sister    Breast cancer Maternal Grandmother    Wilson's disease Maternal Grandmother    Pancreatic cancer Maternal Grandfather    Past Surgical History:  Procedure Laterality Date   APPENDECTOMY     BLADDER SURGERY     x2   BLADDER SURGERY     BREAST BIOPSY     CARDIAC CATHETERIZATION     CARPAL TUNNEL RELEASE     CATARACT EXTRACTION Bilateral    CHOLECYSTECTOMY     DILATION AND CURETTAGE OF UTERUS     ENTEROCELE REPAIR     x2   EYE SURGERY     retina   herniated disc  11/25/2021   L4 and L5   hysterectomy - unknown type     JOINT REPLACEMENT     KNEE ARTHROPLASTY     KNEE SURGERY     x6   NECK SURGERY     fusion  OOPHORECTOMY     QUADRICEPS REPAIR Right    RECTOCELE REPAIR     x2   SHOULDER ARTHROSCOPY WITH SUBACROMIAL DECOMPRESSION, ROTATOR CUFF REPAIR AND BICEP TENDON REPAIR  10/06/2012   Procedure: SHOULDER ARTHROSCOPY WITH SUBACROMIAL DECOMPRESSION, ROTATOR CUFF REPAIR AND BICEP TENDON REPAIR;  Surgeon: Nita Sells, MD;  Location: [facility];  Service: Orthopedics;  Laterality: Right;  Arthroscopic  Repair  of  Subscapularis, Open Biceps Tenodesis   SHOULDER SURGERY     bilateral- bones spur   SHOULDER SURGERY Left 04/15/2021   TONSILLECTOMY     TOTAL KNEE ARTHROPLASTY     TOTAL SHOULDER ARTHROPLASTY     Social History   Social History Narrative   Lives with her husband. She likes to hang out with her friends and play games and do bible study.   Immunization History  Administered Date(s) Administered   Fluad Quad(high Dose 65+) 07/13/2019, 07/10/2020, 07/22/2021   Hepatitis B, adult 02/25/2021, 03/28/2021, 08/26/2021   Influenza Split 07/26/2012, 06/11/2014, 07/25/2015, 06/23/2018   Influenza Whole 07/30/2009, 08/19/2010   Influenza, High Dose Seasonal PF 07/06/2016, 07/05/2017   Influenza,inj,Quad PF,6+ Mos 06/11/2014, 07/25/2015, 06/23/2018   Influenza-Unspecified 07/26/2013,  09/05/2013, 07/13/2019, 07/10/2020, 07/22/2021   Moderna Sars-Covid-2 Vaccination 02/29/2020, 03/28/2020, 10/04/2020   Pneumococcal Conjugate-13 01/04/2017   Pneumococcal Polysaccharide-23 07/30/2009, 11/14/2018   Pneumococcal-Unspecified 10/27/2015   Tdap 02/13/2008, 05/12/2018   Zoster, Live 09/01/2011     Objective: Vital Signs: BP (!) 163/79 (BP Location: Left Arm, Patient Position: Sitting, Cuff Size: Normal)   Pulse 69   Resp 16   Ht '5\' 6"'$  (1.676 m)   Wt 178 lb (80.7 kg)   BMI 28.73 kg/m    Physical Exam Vitals and nursing note reviewed.  Constitutional:      Appearance: She is well-developed.  HENT:     Head: Normocephalic and atraumatic.  Eyes:     Conjunctiva/sclera: Conjunctivae normal.  Cardiovascular:     Rate and Rhythm: Normal rate and regular rhythm.     Heart sounds: Normal heart sounds.  Pulmonary:     Effort: Pulmonary effort is normal.     Breath sounds: Normal breath sounds.  Abdominal:     General: Bowel sounds are normal.     Palpations: Abdomen is soft.  Musculoskeletal:     Cervical back: Normal range of motion.  Lymphadenopathy:     Cervical: No cervical adenopathy.  Skin:    General: Skin is warm and dry.     Capillary Refill: Capillary refill takes less than 2 seconds.  Neurological:     Mental Status: She is alert and oriented to person, place, and time.  Psychiatric:        Behavior: Behavior normal.      Musculoskeletal Exam: She had painful limited range of motion of her cervical and lumbar spine.  Shoulder joints, elbow joints, wrist joints, MCPs PIPs and DIPs with good range of motion.  She had tenderness over left lateral epicondyle region.  Synovial thickening over MCPs with no synovitis was noted.  PIP and DIP prominence was noted.  Hip joints were in good range of motion.  She had tenderness over bilateral trochanteric bursa both prominent on the left side.  Right knee joint was replaced.  Left knee joint was in good range of  motion.  She had some tenderness over ankle joints without any synovitis.  There was no tenderness over ankle joints.  She had generalized hyperalgesia and positive tender points.  CDAI Exam:  CDAI Score: -- Patient Global: --; Provider Global: -- Swollen: --; Tender: -- Joint Exam 04/03/2022   No joint exam has been documented for this visit   There is currently no information documented on the homunculus. Go to the Rheumatology activity and complete the homunculus joint exam.  Investigation: No additional findings.  Imaging: No results found.  Recent Labs: Lab Results  Component Value Date   WBC 6.4 03/24/2022   HGB 12.5 03/24/2022   PLT 359 03/24/2022   NA 131 (L) 03/24/2022   K 4.8 03/24/2022   CL 96 (L) 03/24/2022   CO2 24 03/24/2022   GLUCOSE 83 03/24/2022   BUN 9 03/24/2022   CREATININE 0.65 03/24/2022   BILITOT 0.3 03/24/2022   ALKPHOS 73 05/21/2020   AST 28 03/24/2022   ALT 25 03/24/2022   PROT 6.5 03/24/2022   ALBUMIN 3.8 12/15/2021   CALCIUM 9.8 03/24/2022   GFRAA 94 03/10/2021    Speciality Comments: No specialty comments available.  Procedures:  Large Joint Inj on 04/03/2022 11:48 AM Indications: pain Details: 27 G 1.5 in needle, lateral approach  Arthrogram: No  Medications: 40 mg triamcinolone acetonide 40 MG/ML; 1.5 mL lidocaine 1 % Aspirate: 0 mL Outcome: tolerated well, no immediate complications Procedure, treatment alternatives, risks and benefits explained, specific risks discussed. Consent was given by the patient. Immediately prior to procedure a time out was called to verify the correct patient, procedure, equipment, support staff and site/side marked as required. Patient was prepped and draped in the usual sterile fashion.     Allergies: Codeine, Myrbetriq  [mirabegron], Nystatin, Percocet [oxycodone-acetaminophen], Propoxyphene, Celebrex [celecoxib], Darvocet [propoxyphene n-acetaminophen], Erythromycin, Hydrocodone, Lyrica [pregabalin],  Macrodantin [nitrofurantoin], Toradol [ketorolac tromethamine], Tramadol, and Verapamil   Assessment / Plan:     Visit Diagnoses: Seronegative rheumatoid arthritis (HCC)-she complains of pain and discomfort in multiple joints.  No synovitis was noted on the examination today.  She is on leflunomide 20 mg p.o. daily which she has been tolerating well.  She denies any side effects from the medication.  She complains of pain and stiffness in almost all of her joints.  High risk medication use - Arava 20 mg p.o. daily.  Labs obtained on Mar 24, 2022 were within normal limits.  LFTs have normalized.  She was advised to get labs in August and then every 3 months to monitor for drug toxicity.  Information regarding medication was provided.  She was advised to stop leflunomide in case she develops an infection and resume after the infection resolves.  Raynaud's phenomenon without gangrene-only bothersome during the winter months.  S/P arthroscopy of left shoulder - by Dr. Norwood Levo on 04/15/2021.  She had left shoulder joint cortisone injection by Dr. Norwood Levo in February 2023.  Lateral epicondylitis, left elbow-she has having pain and discomfort over the lateral epicondyle region.  A prescription for tennis elbow brace was given.  I also advised her to use topical Aspercreme.  A handout and forearm exercises was given.  Trochanteric bursitis, left hip-she had tenderness over left trochanteric area.  She states she has been having pain and discomfort in the trochanteric region for the last 3 weeks.  She is having nocturnal pain and difficulty sleeping on the left side.  The pain has been radiating into her thigh.  Patient requested a cortisone injection.  After informed consent was obtained and side effects were discussed left trochanteric bursa was injected as described above.  Patient tolerated the procedure well.  Postprocedure instructions were given.  A  handout on IT band stretches was given.  H/O total knee  replacement, right-she continues to have chronic discomfort.  Primary osteoarthritis of left knee-chronic pain  DDD (degenerative disc disease), cervical-she has limitation with lateral rotation.  She continues to have discomfort in her neck.  DDD (degenerative disc disease), thoracic-chronic pain  DDD (degenerative disc disease), lumbar - Status post lumbar microdiscectomy left L4-5 on November 25, 2021.  She is followed by Dr. Lurene Shadow.  She is in chronic discomfort.  Pseudogout-she has not had a pseudogout flare.  History of foot fracture - History of fifth metatarsal fracture treated by Dr. Sterling Big.  Other osteoporosis without current pathological fracture - Followed by Dr. Donneta Romberg for.  Treated with alendronate, Prolia and then IV Reclast.  Fibromyalgia-she is having a flare of fibromyalgia with generalized pain and discomfort.  She had positive tender points.  Need for regular exercise and stretching was emphasized.  Benefits of aerobics were also discussed.  Chronic fatigue-related to fibromyalgia.  Other medical problems are listed as follows:  IC (interstitial cystitis)  History of hypertension-blood pressure was elevated today.  Advised her to monitor blood pressure closely.  History of CHF (congestive heart failure)  History of Clostridium difficile colitis  History of gastroesophageal reflux (GERD)  Orders: Orders Placed This Encounter  Procedures   Large Joint Inj   No orders of the defined types were placed in this encounter.    Follow-Up Instructions: Return in about 5 months (around 09/03/2022) for Rheumatoid arthritis, Osteoarthritis, Osteoporosis.   Bo Merino, MD  Note - This record has been created using Editor, commissioning.  Chart creation errors have been sought, but may not always  have been located. Such creation errors do not reflect on  the standard of medical care.

## 2022-04-03 ENCOUNTER — Ambulatory Visit (INDEPENDENT_AMBULATORY_CARE_PROVIDER_SITE_OTHER): Payer: Medicare Other | Admitting: Rheumatology

## 2022-04-03 ENCOUNTER — Other Ambulatory Visit: Payer: Self-pay | Admitting: Internal Medicine

## 2022-04-03 ENCOUNTER — Encounter: Payer: Self-pay | Admitting: Rheumatology

## 2022-04-03 VITALS — BP 163/79 | HR 69 | Resp 16 | Ht 66.0 in | Wt 178.0 lb

## 2022-04-03 DIAGNOSIS — N3010 Interstitial cystitis (chronic) without hematuria: Secondary | ICD-10-CM

## 2022-04-03 DIAGNOSIS — Z9889 Other specified postprocedural states: Secondary | ICD-10-CM

## 2022-04-03 DIAGNOSIS — M51369 Other intervertebral disc degeneration, lumbar region without mention of lumbar back pain or lower extremity pain: Secondary | ICD-10-CM

## 2022-04-03 DIAGNOSIS — M5030 Other cervical disc degeneration, unspecified cervical region: Secondary | ICD-10-CM | POA: Diagnosis not present

## 2022-04-03 DIAGNOSIS — Z8719 Personal history of other diseases of the digestive system: Secondary | ICD-10-CM

## 2022-04-03 DIAGNOSIS — M5136 Other intervertebral disc degeneration, lumbar region: Secondary | ICD-10-CM

## 2022-04-03 DIAGNOSIS — Z8679 Personal history of other diseases of the circulatory system: Secondary | ICD-10-CM

## 2022-04-03 DIAGNOSIS — R5382 Chronic fatigue, unspecified: Secondary | ICD-10-CM

## 2022-04-03 DIAGNOSIS — M0600 Rheumatoid arthritis without rheumatoid factor, unspecified site: Secondary | ICD-10-CM

## 2022-04-03 DIAGNOSIS — Z96651 Presence of right artificial knee joint: Secondary | ICD-10-CM

## 2022-04-03 DIAGNOSIS — M1120 Other chondrocalcinosis, unspecified site: Secondary | ICD-10-CM

## 2022-04-03 DIAGNOSIS — M1712 Unilateral primary osteoarthritis, left knee: Secondary | ICD-10-CM

## 2022-04-03 DIAGNOSIS — Z79899 Other long term (current) drug therapy: Secondary | ICD-10-CM | POA: Diagnosis not present

## 2022-04-03 DIAGNOSIS — M5134 Other intervertebral disc degeneration, thoracic region: Secondary | ICD-10-CM

## 2022-04-03 DIAGNOSIS — M797 Fibromyalgia: Secondary | ICD-10-CM

## 2022-04-03 DIAGNOSIS — M7062 Trochanteric bursitis, left hip: Secondary | ICD-10-CM

## 2022-04-03 DIAGNOSIS — Z8781 Personal history of (healed) traumatic fracture: Secondary | ICD-10-CM

## 2022-04-03 DIAGNOSIS — Z8619 Personal history of other infectious and parasitic diseases: Secondary | ICD-10-CM

## 2022-04-03 DIAGNOSIS — M7712 Lateral epicondylitis, left elbow: Secondary | ICD-10-CM | POA: Diagnosis not present

## 2022-04-03 DIAGNOSIS — I7300 Raynaud's syndrome without gangrene: Secondary | ICD-10-CM

## 2022-04-03 DIAGNOSIS — M818 Other osteoporosis without current pathological fracture: Secondary | ICD-10-CM

## 2022-04-03 MED ORDER — TRIAMCINOLONE ACETONIDE 40 MG/ML IJ SUSP
40.0000 mg | INTRAMUSCULAR | Status: AC | PRN
  Administered 2022-04-03: 40 mg via INTRA_ARTICULAR

## 2022-04-03 MED ORDER — LIDOCAINE HCL 1 % IJ SOLN
1.5000 mL | INTRAMUSCULAR | Status: AC | PRN
  Administered 2022-04-03: 1.5 mL

## 2022-04-03 NOTE — Patient Instructions (Signed)
Standing Labs We placed an order today for your standing lab work.   Please have your standing labs drawn in August and every 3 months  If possible, please have your labs drawn 2 weeks prior to your appointment so that the provider can discuss your results at your appointment.  Please note that you may see your imaging and lab results in [facility] before we have reviewed them. We may be awaiting multiple results to interpret others before contacting you. Please allow our office up to 72 hours to thoroughly review all of the results before contacting the office for clarification of your results.  We have open lab daily: Monday through Thursday from 1:30-4:30 PM and Friday from 1:30-4:00 PM at the office of Dr. Bo Merino, [facility].   Please be advised, all patients with office appointments requiring lab work will take precedent over walk-in lab work.  If possible, please come for your lab work on Monday and Friday afternoons, as you may experience shorter wait times. The office is located at [address] [place], [place], [place] [address] No appointment is necessary.   Labs are drawn by [facility]. Please bring your co-pay at the time of your lab draw.  You may receive a bill from [facility] for your lab work.  Please note if you are on Hydroxychloroquine and and an order has been placed for a Hydroxychloroquine level, you will need to have it drawn 4 hours or more after your last dose.  If you wish to have your labs drawn at another location, please call the office 24 hours in advance to send orders.  If you have any questions regarding directions or hours of operation,  please call [phone].   As a reminder, please drink plenty of water prior to coming for your lab work. Thanks!   Vaccines You are taking a medication(s) that can suppress your immune system.  The following immunizations are recommended: Flu annually Covid-19  Td/Tdap (tetanus, diphtheria,  pertussis) every 10 years Pneumonia (Prevnar 15 then Pneumovax 23 at least 1 year apart.  Alternatively, can take Prevnar 20 without needing additional dose) Shingrix: 2 doses from 4 weeks to 6 months apart  Please check with your PCP to make sure you are up to date.   If you have signs or symptoms of an infection or start antibiotics: First, call your PCP for workup of your infection. Hold your medication through the infection, until you complete your antibiotics, and until symptoms resolve if you take the following: Injectable medication (Actemra, Benlysta, Cimzia, Cosentyx, Enbrel, Humira, Kevzara, Orencia, Remicade, Simponi, Stelara, Taltz, Tremfya) Methotrexate Leflunomide (Arava) Mycophenolate (Cellcept) Roma Kayser, or Rinvoq  Iliotibial Band Syndrome Rehab Ask your health care provider which exercises are safe for you. Do exercises exactly as told by your health care provider and adjust them as directed. It is normal to feel mild stretching, pulling, tightness, or discomfort as you do these exercises. Stop right away if you feel sudden pain or your pain gets significantly worse. Do not begin these exercises until told by your health care provider. Stretching and range-of-motion exercises These exercises warm up your muscles and joints and improve the movement and flexibility of your hip and pelvis. Quadriceps stretch, prone  Lie on your abdomen (prone position) on a firm surface, such as a bed or padded floor. Bend your left / right knee and reach back to hold your ankle or pant leg. If you cannot reach your ankle or pant leg, loop a  belt around your foot and grab the belt instead. Gently pull your heel toward your buttocks. Your knee should not slide out to the side. You should feel a stretch in the front of your thigh and knee (quadriceps). Hold this position for __________ seconds. Repeat __________ times. Complete this exercise __________ times a day. Iliotibial band  stretch An iliotibial band is a strong band of muscle tissue that runs from the outer side of your hip to the outer side of your thigh and knee. Lie on your side with your left / right leg in the top position. Bend both of your knees and grab your left / right ankle. Stretch out your bottom arm to help you balance. Slowly bring your top knee back so your thigh goes behind your trunk. Slowly lower your top leg toward the floor until you feel a gentle stretch on the outside of your left / right hip and thigh. If you do not feel a stretch and your knee will not fall farther, place the heel of your other foot on top of your knee and pull your knee down toward the floor with your foot. Hold this position for __________ seconds. Repeat __________ times. Complete this exercise __________ times a day. Strengthening exercises These exercises build strength and endurance in your hip and pelvis. Endurance is the ability to use your muscles for a long time, even after they get tired. Straight leg raises, side-lying This exercise strengthens the muscles that rotate the leg at the hip and move it away from your body (hip abductors). Lie on your side with your left / right leg in the top position. Lie so your head, shoulder, hip, and knee line up. You may bend your bottom knee to help you balance. Roll your hips slightly forward so your hips are stacked directly over each other and your left / right knee is facing forward. Tense the muscles in your outer thigh and lift your top leg 4-6 inches (10-15 cm). Hold this position for __________ seconds. Slowly lower your leg to return to the starting position. Let your muscles relax completely before doing another repetition. Repeat __________ times. Complete this exercise __________ times a day. Leg raises, prone This exercise strengthens the muscles that move the hips backward (hip extensors). Lie on your abdomen (prone position) on your bed or a firm surface. You  can put a pillow under your hips if that is more comfortable for your lower back. Bend your left / right knee so your foot is straight up in the air. Squeeze your buttocks muscles and lift your left / right thigh off the bed. Do not let your back arch. Tense your thigh muscle as hard as you can without increasing any knee pain. Hold this position for __________ seconds. Slowly lower your leg to return to the starting position and allow it to relax completely. Repeat __________ times. Complete this exercise __________ times a day. Hip hike Stand sideways on a bottom step. Stand on your left / right leg with your other foot unsupported next to the step. You can hold on to a railing or wall for balance if needed. Keep your knees straight and your torso square. Then lift your left / right hip up toward the ceiling. Slowly let your left / right hip lower toward the floor, past the starting position. Your foot should get closer to the floor. Do not lean or bend your knees. Repeat __________ times. Complete this exercise __________ times a day. This  information is not intended to replace advice given to you by your health care provider. Make sure you discuss any questions you have with your health care provider. Document Revised: 12/20/2019 Document Reviewed: 12/20/2019 Elsevier Patient Education  [place]. Elbow and Forearm Exercises Ask your health care provider which exercises are safe for you. Do exercises exactly as told by your health care provider and adjust them as directed. It is normal to feel mild stretching, pulling, tightness, or discomfort as you do these exercises. Stop right away if you feel sudden pain or your pain gets worse. Do not begin these exercises until told by your health care provider. Range-of-motion exercises These exercises warm up your muscles and joints and improve the movement and flexibility of your injured elbow and forearm. The exercises also help to relieve  pain, numbness, and tingling. These exercises are done using the muscles in your injured elbow and forearm (active). Elbow flexion, active Hold your left / right arm at your side, and bend your elbow (flexion) as far as you can using only your arm muscles. Hold this position for __________ seconds. Slowly return to the starting position. Repeat __________ times. Complete this exercise __________ times a day. Elbow extension, active Hold your left / right arm at your side, and straighten your elbow (extension) as much as you can using only your arm muscles. Hold this position for __________ seconds. Slowly return to the starting position. Repeat __________ times. Complete this exercise __________ times a day. Active forearm rotation, supination This is an exercise in which you turn (rotate) your forearm palm up (supination). Stand or sit with your elbows at your sides. Bend your left / right elbow to a 90-degree angle (right angle). Rotate your palm up until you feel a gentle stretch on the inside of your forearm. Hold this position for __________ seconds. Slowly return to the starting position. Repeat __________ times. Complete this exercise __________ times a day. Active forearm rotation, pronation This is an exercise in which you turn (rotate) your forearm palm down (pronation). Stand or sit with your elbows at your sides. Bend your left / right elbow to a 90-degree angle (right angle). Rotate your palm down until you feel a gentle stretch on the top of your forearm. Hold this position for __________ seconds. Slowly return to the starting position. Repeat __________ times. Complete this exercise __________ times a day. Stretching exercises These exercises warm up your muscles and joints and improve the movement and flexibility of your injured elbow and forearm. These exercises also help to relieve pain, numbness, and tingling. These exercises are done using your healthy elbow and  forearm to help stretch the muscles in your injured elbow and forearm (active-assisted). Elbow flexion, active-assisted  Hold your left / right arm at your side, and bend your elbow (flexion) as much as you can using your left / right arm muscles. Use your other hand to bend your left / right elbow farther. To do this, gently push up on your forearm until you feel a gentle stretch on the back of your elbow. Hold this position for __________ seconds. Slowly return to the starting position. Repeat __________ times. Complete this exercise __________ times a day. Elbow extension, active-assisted  Hold your left / right arm at your side, and straighten your elbow (extension) as much as you can using your left / right arm muscles. Use your other hand to straighten the left / right elbow farther. To do this, gently push down on  your forearm until you feel a gentle stretch on the inside of your elbow. Hold this position for __________ seconds. Slowly return to the starting position. Repeat __________ times. Complete this exercise __________ times a day. Active-assisted forearm rotation, supination This is an exercise in which you turn (rotate) your forearm palm up (supination). Sit with your left / right elbow bent in a 90-degree angle (right angle) with your forearm resting on a table. Keeping your upper body and shoulder still, rotate your forearm so your palm faces upward. Use your other hand to help rotate your forearm further until you feel a gentle to moderate stretch. Hold this position for __________ seconds. Slowly release the stretch and return to the starting position. Repeat __________ times. Complete this exercise __________ times a day. Active-assisted forearm rotation, pronation This is an exercise in which you turn (rotate) your forearm palm down (pronation). Sit with your left / right elbow bent in a 90-degree angle (right angle) with your forearm resting on a table. Keeping your  upper body and shoulder still, rotate your forearm so your palm faces the tabletop. Use your other hand to help rotate your forearm further until you feel a gentle to moderate stretch. Hold this position for __________ seconds. Slowly release the stretch and return to the starting position. Repeat __________ times. Complete this exercise __________ times a day. Passive elbow flexion, supine Lie on your back (supine position). Extend your left / right arm up in the air, bracing it with your other hand. Let your left / right hand slowly lower toward your shoulder (passive flexion), while your elbow stays pointed toward the ceiling. You should feel a gentle stretch along the back of your upper arm and elbow. If instructed by your health care provider, you may increase the intensity of your stretch by adding a small wrist weight or hand weight. Hold this position for __________ seconds. Slowly return to the starting position. Repeat __________ times. Complete this exercise __________ times a day. Passive elbow extension, supine  Lie on your back (supine position). Make sure that you are in a comfortable position that lets you relax your arm muscles. Place a folded towel under your left / right upper arm so your elbow and shoulder are at the same height. Straighten your left / right arm so your elbow does not rest on the bed or towel. Let the weight of your hand stretch your elbow (passive extension). Keep your arm and chest muscles relaxed. You should feel a stretch on the inside of your elbow. If told by your health care provider, you may increase the intensity of your stretch by adding a small wrist weight or hand weight. Hold this position for __________ seconds. Slowly release the stretch. Repeat __________ times. Complete this exercise __________ times a day. Strengthening exercises These exercises build strength and endurance in your elbow and forearm. Endurance is the ability to use your  muscles for a long time, even after they get tired. Elbow flexion, isometric  Stand or sit up straight. Bend your left / right elbow in a 90-degree angle (right angle), and keep your forearm at the height of your waist. Your thumb should be pointed toward the ceiling (neutral forearm). Place your other hand on top of your left / right forearm. Gently push down while you resist with your left / right arm (isometric flexion). Push as hard as you can with both arms without causing any pain or movement at your left / right elbow. Hold  this position for __________ seconds. Slowly release the tension in both arms. Let your muscles relax completely before you repeat the exercise. Repeat __________ times. Complete this exercise __________ times a day. Elbow extension, isometric  Stand or sit up straight. Place your left / right arm so your palm faces your abdomen and is at the height of your waist. Place your other hand on the underside of your left / right forearm. Gently push up while you resist with your left / right arm (isometric extension). Push as hard as you can with both arms without causing any pain or movement at your left / right elbow. Hold this position for __________ seconds. Slowly release the tension in both arms. Let your muscles relax completely before you repeat the exercise. Repeat __________ times. Complete this exercise __________ times a day. Elbow flexion with forearm palm up  Sit on a firm chair without armrests, or stand up. Place your left / right arm at your side with your elbow straight and your palm facing forward. Holding a __________weight or gripping a rubber exercise band or tubing, bend your elbow to bring your hand toward your shoulder (flexion). Hold this position for __________ seconds. Slowly return to the starting position. Repeat __________ times. Complete this exercise __________ times a day. Elbow extension, active  Sit on a firm chair without armrests,  or stand up. Hold a rubber exercise band or tubing in both hands. Keeping your upper arms at your sides, bring both hands up to your left / right shoulder. Keep your left / right hand just below your other hand. Straighten your left / right elbow (extension) while keeping your other arm still. Hold this position for __________ seconds. Control the resistance of the band or tubing as you return to the starting position. Repeat __________ times. Complete this exercise __________ times a day. Forearm rotation, supination  Sit with your left / right forearm supported on a table. Your elbow should be at waist height and bent at a 90-degree angle (right angle). Gently grasp a lightweight hammer. Rest your hand over the edge of the table with your palm facing down. Without moving your left / right elbow, slowly rotate your forearm to turn your palm up toward the ceiling (supination). Hold this position for __________ seconds. Slowly return to the starting position. Repeat __________ times. Complete this exercise __________ times a day. Forearm rotation, pronation  Sit with your left / right forearm supported on a table. Keep your elbow below shoulder height. Gently grasp a lightweight hammer. Rest your hand over the edge of the table with your palm facing up. Without moving your left / right elbow, slowly rotate your forearm to turn your palm down toward the floor (pronation). Hold this position for __________seconds. Slowly return to the starting position. Repeat __________ times. Complete this exercise __________ times a day. This information is not intended to replace advice given to you by your health care provider. Make sure you discuss any questions you have with your health care provider. Document Revised: 02/02/2019 Document Reviewed: 11/02/2018 Elsevier Patient Education  [place].

## 2022-04-05 ENCOUNTER — Other Ambulatory Visit: Payer: Self-pay | Admitting: Internal Medicine

## 2022-04-13 ENCOUNTER — Ambulatory Visit (INDEPENDENT_AMBULATORY_CARE_PROVIDER_SITE_OTHER): Payer: Medicare Other | Admitting: Internal Medicine

## 2022-04-13 ENCOUNTER — Encounter: Payer: Self-pay | Admitting: Internal Medicine

## 2022-04-13 ENCOUNTER — Telehealth: Payer: Medicare Other

## 2022-04-13 ENCOUNTER — Ambulatory Visit (INDEPENDENT_AMBULATORY_CARE_PROVIDER_SITE_OTHER): Payer: Medicare Other

## 2022-04-13 VITALS — BP 124/72 | HR 61 | Ht 66.0 in | Wt 173.8 lb

## 2022-04-13 DIAGNOSIS — I10 Essential (primary) hypertension: Secondary | ICD-10-CM | POA: Diagnosis not present

## 2022-04-13 DIAGNOSIS — I471 Supraventricular tachycardia: Secondary | ICD-10-CM

## 2022-04-13 NOTE — Progress Notes (Unsigned)
Enrolled for [employer] to mail a ZIO XT long term holter monitor to the patients address on file.  

## 2022-04-13 NOTE — Progress Notes (Signed)
HPI Mrs. Bachus returns today for followup.She is a pleasant 71 yo woman with HTN and SVT who has remained with medical therapy over the years. Her bp has been fairly labile. She has had minutes of palpitations. She has a cardia mobile app and she has had ST in the low 100's. She describes episodes where she feels like her body is shaking.  Allergies  Allergen Reactions   Codeine Nausea And Vomiting   Myrbetriq  [Mirabegron] Other (See Comments)    SEVERE HEADACHE   Nystatin    Percocet [Oxycodone-Acetaminophen] Nausea And Vomiting   Propoxyphene Nausea And Vomiting   Celebrex [Celecoxib] Other (See Comments)    Other reaction(s): Other flushed   Darvocet [Propoxyphene N-Acetaminophen] Nausea And Vomiting   Erythromycin Nausea And Vomiting    Nausea and vomiting    Hydrocodone Nausea And Vomiting   Lyrica [Pregabalin] Swelling    Legs swelling    Macrodantin [Nitrofurantoin] Nausea And Vomiting   Toradol [Ketorolac Tromethamine] Nausea And Vomiting    Per patient, only PO form causes nausea and vomiting. Can take injection without issue   Tramadol Nausea And Vomiting   Verapamil Nausea And Vomiting    REACTION: intolerance     Current Outpatient Medications  Medication Sig Dispense Refill   albuterol (VENTOLIN HFA) 108 (90 Base) MCG/ACT inhaler Inhale 2 puffs into the lungs every 4 (four) hours.     amitriptyline (ELAVIL) 10 MG tablet Take by mouth.     amoxicillin (AMOXIL) 500 MG capsule Before dental procedures     colestipol (COLESTID) 1 g tablet Take 1 g by mouth 2 (two) times daily.     Cyanocobalamin (VITAMIN B-12 IJ) Inject 1 mL as directed every 30 (thirty) days.      donepezil (ARICEPT) 10 MG tablet TAKE ONE TABLET BY MOUTH EVERY NIGHT AT BEDTIME 90 tablet 3   gabapentin (NEURONTIN) 300 MG capsule TAKE 1 TO 2 CAPSULES BY MOUTH UP TO TWO TIMES DAILY AS NEEDED (Patient taking differently: Take 300 mg by mouth 2 (two) times daily.) 180 capsule 3   ibuprofen  (ADVIL) 600 MG tablet ibuprofen 600 mg tablet  TAKE 1 TABLET BY MOUTH EVERY 6 HOURS AS NEEDED     ipratropium (ATROVENT) 0.03 % nasal spray Place 2 sprays into both nostrils every 12 (twelve) hours. 30 mL 1   leflunomide (ARAVA) 20 MG tablet TAKE ONE TABLET BY MOUTH DAILY 90 tablet 0   Loratadine (CLARITIN PO) Take by mouth.     losartan (COZAAR) 50 MG tablet Take 1 tablet (50 mg total) by mouth daily. 30 tablet 0   Melatonin 10 MG CAPS Take 10 capsules by mouth daily.     memantine (NAMENDA) 10 MG tablet Take 1 tablet (10 mg total) by mouth 2 (two) times daily. 180 tablet 4   metoprolol tartrate (LOPRESSOR) 100 MG tablet TAKE 1 AND 1/2 TABLETS TWICE DAILY 45 tablet 0   ondansetron (ZOFRAN) 4 MG tablet Take by mouth.     ondansetron (ZOFRAN-ODT) 8 MG disintegrating tablet Take 1 tablet by mouth as needed for nausea.     pantoprazole (PROTONIX) 40 MG tablet Take 40 mg by mouth 2 (two) times daily.     polyethylene glycol powder (GLYCOLAX/MIRALAX) 17 GM/SCOOP powder Take 17 g by mouth daily as needed for mild constipation or moderate constipation.      primidone (MYSOLINE) 50 MG tablet TAKE 1 TABLET AT BEDTIME 90 tablet 1   venlafaxine XR (EFFEXOR-XR) 75  MG 24 hr capsule TAKE ONE CAPSULE BY MOUTH DAILY WITH BREAFAST 90 capsule 3   zoledronic acid (RECLAST) 5 MG/100ML SOLN injection      No current facility-administered medications for this visit.     Past Medical History:  Diagnosis Date   Anal fissure    Anemia    Arthritis    Blood transfusion    C. difficile colitis    Chest pain    a. 01/2013 MV: EF 59%, no ischemia.   Chronic Dyspnea    a. 01/2013 Echo: EF 60-65%, Gr 1 DD, PASP 22mHg.// Echo 01/2020: EF 60-65, no RWMA, GR 1 DD, GLS -21.6%, normal RV SF, trivial MR    COVID-19    DDD (degenerative disc disease), cervical    DDD (degenerative disc disease), lumbar    Diabetes mellitus    Fibromyalgia    Gall stones    GERD (gastroesophageal reflux disease)    gastritis    Hemorrhoids    Hypertension    Interstitial cystitis    MCI (mild cognitive impairment) 11/25/2017   Memory difficulty 12/14/2013   Neuromuscular disorder (HCC)    sclerosis   Osteoporosis    Peptic ulcer    PONV (postoperative nausea and vomiting)    Pseudogout    Raynaud phenomenon    Rectal bleeding    Sleep apnea    a. on cpap.   SVT (supraventricular tachycardia) ([facility]    Syncope 09/10/2015   Thyroid disease    hypothyroidism   Tremor, essential 05/14/2016    ROS:   All systems reviewed and negative except as noted in the HPI.   Past Surgical History:  Procedure Laterality Date   APPENDECTOMY     BLADDER SURGERY     x2   BLADDER SURGERY     BREAST BIOPSY     CARDIAC CATHETERIZATION     CARPAL TUNNEL RELEASE     CATARACT EXTRACTION Bilateral    CHOLECYSTECTOMY     DILATION AND CURETTAGE OF UTERUS     ENTEROCELE REPAIR     x2   EYE SURGERY     retina   herniated disc  11/25/2021   L4 and L5   hysterectomy - unknown type     JOINT REPLACEMENT     KNEE ARTHROPLASTY     KNEE SURGERY     x6   NECK SURGERY     fusion   OOPHORECTOMY     QUADRICEPS REPAIR Right    RECTOCELE REPAIR     x2   SHOULDER ARTHROSCOPY WITH SUBACROMIAL DECOMPRESSION, ROTATOR CUFF REPAIR AND BICEP TENDON REPAIR  10/06/2012   Procedure: SHOULDER ARTHROSCOPY WITH SUBACROMIAL DECOMPRESSION, ROTATOR CUFF REPAIR AND BICEP TENDON REPAIR;  Surgeon: JNita Sells MD;  [place] [facility]  Service: Orthopedics;  Laterality: Right;  Arthroscopic  Repair  of  Subscapularis, Open Biceps Tenodesis   SHOULDER SURGERY     bilateral- bones spur   SHOULDER SURGERY Left 04/15/2021   TONSILLECTOMY     TOTAL KNEE ARTHROPLASTY     TOTAL SHOULDER ARTHROPLASTY       Family History  Problem Relation Age of Onset   Heart attack Mother    Stroke Mother    Diabetes Mother    Heart disease Mother    Colon polyps Mother    Asthma Mother    Dementia Mother    Heart disease  Father    Heart attack Father    Asthma Father  Stroke Sister    Hypertension Sister    Rheum arthritis Sister    Dementia Sister    Asthma Sister    Lupus Sister    Asthma Sister    Breast cancer Maternal Grandmother    Wilson's disease Maternal Grandmother    Pancreatic cancer Maternal Grandfather      Social History   Socioeconomic History   Marital status: Married    Spouse name: Clarnce Flock"   Number of children: 2   Years of education: 14   Highest education level: Associate degree: academic program  Occupational History   Occupation: Retired  Tobacco Use   Smoking status: Never   Smokeless tobacco: Never  Vaping Use   Vaping Use: Never used  Substance and Sexual Activity   Alcohol use: No    Alcohol/week: 0.0 standard drinks of alcohol   Drug use: No   Sexual activity: Not on file  Other Topics Concern   Not on file  Social History Narrative   Lives with her husband. She likes to hang out with her friends and play games and do bible study.   Social Determinants of Health   Financial Resource Strain: Low Risk  (02/26/2021)   Overall Financial Resource Strain (CARDIA)    Difficulty of Paying Living Expenses: Not hard at all  Food Insecurity: No Food Insecurity (02/26/2021)   Hunger Vital Sign    Worried About Running Out of Food in the Last Year: Never true    Ran Out of Food in the Last Year: Never true  Transportation Needs: No Transportation Needs (02/26/2021)   PRAPARE - Hydrologist (Medical): No    Lack of Transportation (Non-Medical): No  Physical Activity: Inactive (02/26/2021)   Exercise Vital Sign    Days of Exercise per Week: 0 days    Minutes of Exercise per Session: 0 min  Stress: No Stress Concern Present (02/26/2021)   [place]    Feeling of Stress : Not at all  Social Connections: Socially Integrated (02/26/2021)   Social Connection and Isolation  Panel [NHANES]    Frequency of Communication with Friends and Family: More than three times a week    Frequency of Social Gatherings with Friends and Family: More than three times a week    Attends Religious Services: More than 4 times per year    Active Member of [employer] or Organizations: Yes    Attends Music therapist: More than 4 times per year    Marital Status: Married  Human resources officer Violence: Not At Risk (02/26/2021)   Humiliation, Afraid, Rape, and Kick questionnaire    Fear of Current or Ex-Partner: No    Emotionally Abused: No    Physically Abused: No    Sexually Abused: No     BP 124/72   Pulse 61   Ht '5\' 6"'$  (1.676 m)   Wt 173 lb 12.8 oz (78.8 kg)   SpO2 96%   BMI 28.05 kg/m   Physical Exam:  Well appearing NAD HEENT: Unremarkable Neck:  No JVD, no thyromegally Lymphatics:  No adenopathy Back:  No CVA tenderness Lungs:  Clear with no wheezes HEART:  Regular rate rhythm, no murmurs, no rubs, no clicks Abd:  soft, positive bowel sounds, no organomegally, no rebound, no guarding Ext:  2 plus pulses, no edema, no cyanosis, no clubbing Skin:  No rashes no nodules Neuro:  CN II through XII intact, motor grossly  intact   Assess/Plan:  1. Palpitations - she has not had any documented SVT in many years. I was confused that she had catheter ablation years ago before EMR but she states that she has never had an ablation. She has remotely had documented SVT.  She will continue her beta blocker. I have recommended she wear a 14 day zio monitor.  2. HTN - this is her biggest problem. She is on losartan and metoprolol and norvasc. I strongly encouraged her to avoid salty food. We might consider switching to coreg in the future. 3. Dementia - she is on both aricept and namenda. We will follow. 4. Syncope - she has not had any recently. I suspect that this was related to her h/o SVT.   Carleene Overlie Shealeigh Dunstan,MD

## 2022-04-13 NOTE — Patient Instructions (Addendum)
Medication Instructions:  Your physician recommends that you continue on your current medications as directed. Please refer to the Current Medication list given to you today.  Labwork: None ordered.  Testing/Procedures:  Your physician has recommended that you wear a holter monitor. Holter monitors are medical devices that record the heart's electrical activity. Doctors most often use these monitors to diagnose arrhythmias. Arrhythmias are problems with the speed or rhythm of the heartbeat. The monitor is a small, portable device. You can wear one while you do your normal daily activities. This is usually used to diagnose what is causing palpitations/syncope (passing out).  You will wear a 14 day ZIO Monitor.    Follow-Up: Your physician wants you to follow-up in: 6-8 weeks with Cristopher Peru, M  Any Other Special Instructions Will Be Listed Below (If Applicable).  If you need a refill on your cardiac medications before your next appointment, please call your pharmacy.    Your physician has recommended that you wear a Zio monitor.   This monitor is a medical device that records the heart's electrical activity. Doctors most often use these monitors to diagnose arrhythmias. Arrhythmias are problems with the speed or rhythm of the heartbeat. The monitor is a small device applied to your chest. You can wear one while you do your normal daily activities. While wearing this monitor if you have any symptoms to push the button and record what you felt. Once you have worn this monitor for the period of time provider prescribed (Usually 14 days), you will return the monitor device in the postage paid box. Once it is returned they will download the data collected and provide [place] with a report which the provider will then review and we will call you with those results. Important tips:  Avoid showering during the first 24 hours of wearing the monitor. Avoid excessive sweating to help maximize wear  time. Do not submerge the device, no hot tubs, and no swimming pools. Keep any lotions or oils away from the patch. After 24 hours you may shower with the patch on. Take brief showers with your back facing the shower head.  Do not remove patch once it has been placed because that will interrupt data and decrease adhesive wear time. Push the button when you have any symptoms and write down what you were feeling. Once you have completed wearing your monitor, remove and place into box which has postage paid and place in your outgoing mailbox.  If for some reason you have misplaced your box then call our office and we can provide another box and/or mail it off for you.

## 2022-04-14 DIAGNOSIS — L821 Other seborrheic keratosis: Secondary | ICD-10-CM | POA: Diagnosis not present

## 2022-04-14 DIAGNOSIS — B078 Other viral warts: Secondary | ICD-10-CM | POA: Diagnosis not present

## 2022-04-14 DIAGNOSIS — D692 Other nonthrombocytopenic purpura: Secondary | ICD-10-CM | POA: Diagnosis not present

## 2022-04-14 DIAGNOSIS — D235 Other benign neoplasm of skin of trunk: Secondary | ICD-10-CM | POA: Diagnosis not present

## 2022-04-14 DIAGNOSIS — L579 Skin changes due to chronic exposure to nonionizing radiation, unspecified: Secondary | ICD-10-CM | POA: Diagnosis not present

## 2022-04-14 DIAGNOSIS — S50812A Abrasion of left forearm, initial encounter: Secondary | ICD-10-CM | POA: Diagnosis not present

## 2022-04-14 DIAGNOSIS — Z85828 Personal history of other malignant neoplasm of skin: Secondary | ICD-10-CM | POA: Diagnosis not present

## 2022-04-14 DIAGNOSIS — L814 Other melanin hyperpigmentation: Secondary | ICD-10-CM | POA: Diagnosis not present

## 2022-04-16 ENCOUNTER — Ambulatory Visit (INDEPENDENT_AMBULATORY_CARE_PROVIDER_SITE_OTHER): Payer: Medicare Other | Admitting: Pharmacist

## 2022-04-16 DIAGNOSIS — M818 Other osteoporosis without current pathological fracture: Secondary | ICD-10-CM

## 2022-04-16 DIAGNOSIS — R7301 Impaired fasting glucose: Secondary | ICD-10-CM

## 2022-04-16 DIAGNOSIS — I10 Essential (primary) hypertension: Secondary | ICD-10-CM

## 2022-04-16 NOTE — Patient Instructions (Signed)
Visit Information  Thank you for taking time to visit with me today. Please don't hesitate to contact me if I can be of assistance to you before our next scheduled telephone appointment.  Following are the goals we discussed today:  Patient Goals/Self-Care Activities Over the next 365 days, patient will:  take medications as prescribed  Follow Up Plan: Telephone follow up appointment with care management team member scheduled for:  8-12 months   Please call the care guide team at [phone] if you need to cancel or reschedule your appointment.    Patient verbalizes understanding of instructions and care plan provided today and agrees to view in [place]. Active MyChart status and patient understanding of how to access instructions and care plan via MyChart confirmed with patient.     Darius Bump

## 2022-04-16 NOTE — Progress Notes (Signed)
Chronic Care Management Pharmacy Note  04/16/2022 Name:  Mia [name] MRN:  [id number] DOB:  25-Jan-1951  Summary: addressed HTN, impaired fasting glucose, and osteoporosis. Dealing with bursitis flare up, works with rheumatologist for this.  BP checks at home run "occasionally a little high," takes an extra losartan if SBP > 150. This is under the advice of Dr Lovena Le, her electrophysiologist.  Recommendations/Changes made from today's visit: No changes at this time.   Plan: f/u with pharmacist in 8-12 months  Subjective: Mia [name] is an 71 y.o. year old female who is a primary patient of Metheney, Rene Kocher, MD.  The CCM team was consulted for assistance with disease management and care coordination needs.    Engaged with patient by telephone for follow up visit in response to provider referral for pharmacy case management and/or care coordination services.   Consent to Services:  The patient was given information about Chronic Care Management services, agreed to services, and gave verbal consent prior to initiation of services.  Please see initial visit note for detailed documentation.   Patient Care Team: Hali Marry, MD as PCP - General (Family Medicine) Nahser, Wonda Cheng, MD as PCP - Cardiology (Cardiology) Silverio Decamp, MD as Consulting Physician (Sports Medicine) Bo Merino, MD as Consulting Physician (Rheumatology) Carolan Clines, MD (Inactive) as Consulting Physician (Urology) Collene Gobble, MD as Consulting Physician (Pulmonary Disease) Richmond Campbell, MD as Consulting Physician (Gastroenterology) Zadie Rhine Clent Demark, MD as Consulting Physician (Ophthalmology) Dr. [name] as Referring Physician (Neurology) Daubert, Marlise Eves, MD as Consulting Physician (Orthopedic Surgery) Eusebio Friendly, MD as Referring Physician (Internal Medicine) Darius Bump, [facility] as Pharmacist (Pharmacist) Otelia Sergeant, OD as Referring Physician  (Optometry)  Recent office visits:  06/26/21 Morrie Sheldon MD - Seen for Pernicious anemia - Vitamin B12 injection given - Follow up in 4 weeks  05/29/21 Joanette Gula MD - Seen for B12 Deficiency - Vitamin B12 injection given - Follow up in 4 weeks 05/01/21 Seen for B12 Deficiency - Vitamin B12 injection given - Follow up in 3 weeks 04/08/21 [name] PA - Seen for B12 Deficiency - Vitamin B12 injection given - Follow up in 3 weeks 03/18/21 Morrie Sheldon MD - Seen for B12 Deficiency - Vitamin B12 injection and prolia injection given - Stopped metformin - Labs ordered - Referral to dermatology - Follow up in 3 weeks  02/26/21 [name] - Seen for medicare annual wellness visit - No medication changes noted - Follow up in 1 year  02/12/21 Morrie Sheldon MD - Seen for osteoporosis - Labs ordered - Vitamin B12 injection given - Follow up in 1 month  01/15/21 Hali Marry MD - Seen for Pernicious anemia - Vitamin B12 injection given - Follow up in 1 month        Recent consult visits:  07/02/21 [name] PA - Rheumatology - Seen for Seronegative rheumatoid arthritis - No medication changes noted -  Follow up in 5 months  06/25/21 [name] PA - [facility] - Seen for Greater tronchanteric bursitis - Started predniSONE 10 MG tablet  KWI:OXBD 6 tablets (60 mg) by mouth in the morning for 2 days, THEN 5 tablets (50 mg) in the morning for 2 days, THEN 4 tablets (40 mg) in the morning for 2 days, THEN 3 tablets (30 mg) in the morning for 2 days, THEN 2 tablets (20 mg) in the morning for 2 days, THEN 1  tablet (10 mg) in the morning for 2 days - X rays ordered - corticosteroid injection given - No follow up noted  06/18/21 [name] MD - [place] for acute pain of left shoulder - No medication changes noted - Follow up in 4-6 weeks  06/05/21 Eusebio Friendly MD - [facility] - Seen for gastroesophageal reflux - Stop Amlodipine Besylate 5 Mg  Tablet, and Primidone (Mysoline) 50 Mg Tablet. Start pantoprazole, Lomotil, and ferrous sulfate 325 mg - Follow up in 6 months 05/23/21 [name] MD - [name] - Seen for Surgery aftercare - No medication changes - Follow up in 1 month 05/19/21 - Steffanie Rainwater Physical therapist - [facility] - Seen for closed fracture of fifth metatarsal bone - No notes available  05/01/21 [name] PA - [facility] - Seen for aftercare following surgery - No medication changes - No follow up noted  04/22/21 [name] PA - [facility] - Seen for aftercare following surgery - Started tiZANidine (Zanaflex) 4 MG capsule, ZSW:FUXN 1 capsule (4 mg) by mouth if needed at bedtime for muscle spasms - follow up in 3- 4 weeks  04/15/21 [name] - Sports medicine - Seen for shoulder arthroscopy - Notes not available  04/14/21 [name] - Orthopedic surgery - Seen for pain in right foot - Notes not available  04/07/21 [name] - Dermatology - Seen for carcinoma in situ of skin - No notes available  03/31/21 [name] PA - Seen for pain in left foot - No notes available  03/28/21 Eusebio Friendly - Gastroenterology - Seen for immunization - No notes available  03/25/21 [name] PA  - Seen for unspecified nonsuppurative otitis media - No notes available  03/20/21 Eusebio Friendly - Gastroenterology - Seen for iron deficiency - No notes available  03/19/21 [name] MD - [name] - Seen for chronic left shoulder pain - No medication changes noted - No follow up noted  03/13/21 [name] MD - [name] - Seen for pain in left shoulder - No notes available  03/06/21 Eusebio Friendly - Radiology department - Seen for elevated liver function tests - Injections given fentaNYL citrate 25 mcg and 50 mcg, lidocaine 2%, ondansetron 4 mg - Given 1 dose of LORazepam 1 mg tablet while in office - Follow up not noted  03/04/21 Chancy Givens NP - Neurology - Seen for fibromyalgia -  No medication changes - Follow up in 6  months  03/03/21 [name] MD - [name] - Seen for Left shoulder pain - no notes available  02/25/21 Eusebio Friendly - Gastroenterology - Immunization encounter - No notes available  02/13/21 Nathaniel Man MD - [name] - Seen for cervical spine pain - No medication changes - Follow up in 6 months  02/07/21 Luanna Cole - [facility] - Seen for pain in right foot - No notes available  02/06/21 Theadora Rama B Daubert - Seen for Radiculopathy - No notes available  02/05/21 Eusebio Friendly - Radiology department - Seen for elevated liver function tests - No medication changes noted - No follow up noted  01/29/21 Bo Merino MD - Rheumatology - Seen for seronegative rheumatoid arthritis - No medication changes - Follow up in 5 months  01/29/21 Eusebio Friendly - Gastroenterology - Start Lomotil once a day - changes pantoprazole from 40 mg to 20 mg, 2 times daily. Modified colestipol to 1 g tablet 2 times daily - Follow up in 4 months  01/23/21 Dylan Plocki PA - [facility] -  Seen for pain in right foot - No notes available          Hospital visits:  None in previous 6 months  Objective:  Lab Results  Component Value Date   CREATININE 0.65 03/24/2022   CREATININE 0.80 12/15/2021   CREATININE 0.86 09/09/2021    Lab Results  Component Value Date   HGBA1C 5.5 02/19/2022   Last diabetic Eye exam:  Lab Results  Component Value Date/Time   HMDIABEYEEXA No Retinopathy 06/24/2021 12:00 AM    Last diabetic Foot exam: No results found for: "HMDIABFOOTEX"      Component Value Date/Time   CHOL 215 (H) 08/01/2021 0000   CHOL 128 06/07/2019 0934   TRIG 125 08/01/2021 0000   HDL 68 08/01/2021 0000   HDL 52 06/07/2019 0934   CHOLHDL 3.2 08/01/2021 0000   VLDL 22 08/28/2016 1558   LDLCALC 123 (H) 08/01/2021 0000       Latest Ref Rng & Units 03/24/2022   10:38 AM 01/20/2022    1:51 PM 12/18/2021    1:49 PM  Hepatic Function  Total Protein 6.1 -  8.1 g/dL 6.5   6.8   AST 10 - 35 U/L '28  20  24   '$ ALT 6 - 29 U/L 25  22  42   Total Bilirubin 0.2 - 1.2 mg/dL 0.3   0.5   Bilirubin, Direct 0.0 - 0.2 mg/dL   0.1     Lab Results  Component Value Date/Time   TSH 0.97 12/15/2021 11:42 AM   TSH 1.34 05/21/2020 08:19 AM   FREET4 0.96 08/31/2010 01:08 AM       Latest Ref Rng & Units 03/24/2022   10:38 AM 12/18/2021    1:49 PM 09/09/2021   11:07 AM  CBC  WBC 3.8 - 10.8 Thousand/uL 6.4  7.6  5.7   Hemoglobin 11.7 - 15.5 g/dL 12.5  12.8  14.4   Hematocrit 35.0 - 45.0 % 39.2  39.3  43.0   Platelets 140 - 400 Thousand/uL 359  377  405     Lab Results  Component Value Date/Time   VD25OH 33.06 12/15/2021 11:42 AM   VD25OH 76 09/09/2015 02:57 PM    Social History   Tobacco Use  Smoking Status Never  Smokeless Tobacco Never   BP Readings from Last 3 Encounters:  04/13/22 124/72  04/03/22 (!) 163/79  02/19/22 108/60   Pulse Readings from Last 3 Encounters:  04/13/22 61  04/03/22 69  02/19/22 61   Wt Readings from Last 3 Encounters:  04/13/22 173 lb 12.8 oz (78.8 kg)  04/03/22 178 lb (80.7 kg)  03/25/22 179 lb (81.2 kg)    Assessment: Review of patient past medical history, allergies, medications, health status, including review of consultants reports, laboratory and other test data, was performed as part of comprehensive evaluation and provision of chronic care management services.   SDOH:  (Social Determinants of Health) assessments and interventions performed:    CCM Care Plan  Allergies  Allergen Reactions   Codeine Nausea And Vomiting   Myrbetriq  [Mirabegron] Other (See Comments)    SEVERE HEADACHE   Nystatin    Percocet [Oxycodone-Acetaminophen] Nausea And Vomiting   Propoxyphene Nausea And Vomiting   Celebrex [Celecoxib] Other (See Comments)    Other reaction(s): Other flushed   Darvocet [Propoxyphene N-Acetaminophen] Nausea And Vomiting   Erythromycin Nausea And Vomiting    Nausea and vomiting     Hydrocodone Nausea And Vomiting   Lyrica [Pregabalin]  Swelling    Legs swelling    Macrodantin [Nitrofurantoin] Nausea And Vomiting   Toradol [Ketorolac Tromethamine] Nausea And Vomiting    Per patient, only PO form causes nausea and vomiting. Can take injection without issue   Tramadol Nausea And Vomiting   Verapamil Nausea And Vomiting    REACTION: intolerance    Medications Reviewed Today     Reviewed by Darius Bump, [facility] (Pharmacist) on 04/16/22 at 1112  Med List Status: <None>   Medication Order Taking? Sig Documenting Provider Last Dose Status Informant  albuterol (VENTOLIN HFA) 108 (90 Base) MCG/ACT inhaler [id number] Yes Inhale 2 puffs into the lungs every 4 (four) hours. [provider] Taking Active   amitriptyline (ELAVIL) 10 MG tablet [id number] Yes Take by mouth. [provider] Taking Active   amoxicillin (AMOXIL) 500 MG capsule [id number] Yes Before dental procedures [provider] Taking Active   colestipol (COLESTID) 1 g tablet [id number] Yes Take 1 g by mouth 2 (two) times daily. [provider] Taking Active   Cyanocobalamin (VITAMIN B-12 IJ) [id number] Yes Inject 1 mL as directed every 30 (thirty) days.  [provider] Taking Active Self  donepezil (ARICEPT) 10 MG tablet [id number] Yes TAKE ONE TABLET BY MOUTH EVERY NIGHT AT BEDTIME Kaeser Givens, NP Taking Active   gabapentin (NEURONTIN) 300 MG capsule [id number] Yes TAKE 1 TO 2 CAPSULES BY MOUTH UP TO TWO TIMES DAILY AS NEEDED  Patient taking differently: Take 300 mg by mouth 2 (two) times daily.   Hali Marry, MD Taking Active Self  ibuprofen (ADVIL) 600 MG tablet [id number] Yes ibuprofen 600 mg tablet  TAKE 1 TABLET BY MOUTH EVERY 6 HOURS AS NEEDED [provider] Taking Active   ipratropium (ATROVENT) 0.03 % nasal spray [id number] Yes Place 2 sprays into both nostrils every 12 (twelve) hours. [name], PA-C Taking Active   leflunomide (ARAVA)  20 MG tablet [id number] Yes TAKE ONE TABLET BY MOUTH DAILY Bo Merino, MD Taking Active   Loratadine (CLARITIN PO) [id number] Yes Take by mouth. [provider] Taking Active   losartan (COZAAR) 50 MG tablet [id number] Yes Take 1 tablet (50 mg total) by mouth daily. Evans Lance, MD Taking Active   Melatonin 10 MG CAPS [id number] Yes Take 10 capsules by mouth daily. [provider] Taking Active Self  memantine (NAMENDA) 10 MG tablet [id number] Yes Take 1 tablet (10 mg total) by mouth 2 (two) times daily. [place] Givens, NP Taking Active   metoprolol tartrate (LOPRESSOR) 100 MG tablet [id number] Yes TAKE 1 AND 1/2 TABLETS TWICE DAILY Nahser, Wonda Cheng, MD Taking Active   ondansetron [facility]) 4 MG tablet [id number] Yes Take by mouth. [provider] Taking Active   ondansetron (ZOFRAN-ODT) 8 MG disintegrating tablet [id number] Yes Take 1 tablet by mouth as needed for nausea. [provider] Taking Active   pantoprazole (PROTONIX) 40 MG tablet [id number] Yes Take 40 mg by mouth 2 (two) times daily. [provider] Taking Active Self  polyethylene glycol powder (GLYCOLAX/MIRALAX) 17 GM/SCOOP powder [id number] Yes Take 17 g by mouth daily as needed for mild constipation or moderate constipation.  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   Weakness of both lower extremities 06/11/2020   Hyponatremia 05/22/2020    Gait instability 06/20/2019   Facet arthritis of lumbar region 05/10/2018   MCI (mild cognitive impairment) 11/25/2017   Restrictive lung disease 07/30/2017   Mixed hyperlipidemia 06/18/2017   Rectocele 04/26/2017   Irritable bowel syndrome with both constipation and diarrhea 04/26/2017   Osteoporosis 04/18/2017   Trochanteric bursitis of both hips 02/24/2017   Stress fracture of left tibia 11/05/2016   Inflammatory arthritis 09/30/2016   High risk medication use 09/30/2016   History of Clostridium difficile colitis 09/30/2016   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   Pseudogout 09/30/2016   DDD cervical spine status post fusion 09/30/2016   DDD thoracic spine 09/30/2016   H/O total knee replacement, right 09/30/2016   Age-related osteoporosis without current pathological fracture 09/30/2016   Tremor, essential 05/14/2016   Right foot pain 04/14/2016   B12 deficiency 09/10/2015   Syncope 09/10/2015   Cervical facet joint syndrome 09/10/2015   Chronic fatigue 09/10/2015   Aortic atherosclerosis ([place]) 04/01/2015   DOE (dyspnea on exertion)    Primary osteoarthritis of right knee 08/13/2014   Benign head tremor 06/11/2014   Lumbar degenerative disc disease 06/11/2014   IFG (impaired fasting glucose) 03/09/2014   Hemorrhoid 03/09/2014   Retinal wrinkling, right eye 03/09/2014   Fibromyalgia 03/09/2014   GERD (gastroesophageal reflux disease) 03/09/2014   History of arthroplasty of right knee 01/31/2014   Memory difficulty 12/14/2013   Hemorrhoids, external, thrombosed 06/22/2011   Hypertension 03/11/2011   SVT (supraventricular tachycardia) ([place])    Raynaud phenomenon    EDEMA 08/25/2010   Nonspecific (abnormal) findings on radiological and other examination of body structure 08/25/2010   COMPUTERIZED TOMOGRAPHY, CHEST, ABNORMAL 08/25/2010   OSA (obstructive sleep apnea) 04/24/2009   Allergic rhinitis 06/11/2008   Dyspnea 06/11/2008   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/2008   Chronic diastolic CHF (congestive heart failure) ([place]) 06/08/2008   INTERSTITIAL CYSTITIS 06/08/2008    Immunization History  Administered Date(s) Administered   Fluad Quad(high Dose 65+) 07/13/2019, 07/10/2020, 07/22/2021   Hepatitis B, adult 02/25/2021, 03/28/2021, 08/26/2021   Influenza Split 07/26/2012, 06/11/2014, 07/25/2015, 06/23/2018   Influenza Whole 07/30/2009, 08/19/2010   Influenza, High Dose Seasonal PF 07/06/2016, 07/05/2017   Influenza,inj,Quad PF,6+ Mos 06/11/2014, 07/25/2015, 06/23/2018   Influenza-Unspecified 07/26/2013, 09/05/2013, 07/13/2019, 07/10/2020, 07/22/2021   Moderna Sars-Covid-2 Vaccination 02/29/2020, 03/28/2020, 10/04/2020   Pneumococcal Conjugate-13 01/04/2017   Pneumococcal Polysaccharide-23 07/30/2009, 11/14/2018   Pneumococcal-Unspecified 10/27/2015   Tdap 02/13/2008, 05/12/2018   Zoster, Live 09/01/2011    Conditions to be addressed/monitored: HTN and preDM, osteoporosis  Care Plan : Medication Management  Updates made by Darius Bump, [place] since 04/16/2022 12:00 AM     Problem: HTN, IFG, osteoporosis      Long-Range Goal: Disease Progression Prevention   Start Date: 07/09/2021  Recent Progress: On track  Priority: High  Note:   Current Barriers:  Occasionally forgets to take medication  Pharmacist Clinical Goal(s):  Over the next 365 days, patient will adhere to plan to optimize therapeutic regimen for chronic conditions as evidenced by report of adherence to recommended medication management changes through collaboration with PharmD and provider.   Interventions: 1:1 collaboration with Hali Marry,  MD regarding development and update of comprehensive plan of care as evidenced by provider attestation and co-signature Inter-disciplinary care team collaboration (see longitudinal plan of care) Comprehensive medication review performed; medication list updated in electronic medical record  Impaired  Fasting Glucose:  Controlled; current treatment:lifestyle modifications only; a1c 5.5. Hx of DM in mother & grandmother. Has taken metformin in previous years  Current glucose readings: not currently checking  Denies/reports hypoglycemic/hyperglycemic symptoms  Current meal patterns: breakfast: 1/2 bagel; & cup of coffee; lunch: peaches or candy bar/snack; dinner: chicken or pasta, sometimes just a cup of coffee; snacks: pork rinds, nuts, potato chips; drinks: coke zero (2-3 per day),   Current exercise: limited by shoulder surgery, taking it easy recovering  Counseled on diet/lifestyle options to continue preventing progression in to diabetes Recommended continue aiming for carb/sugars in moderation,  Hypertension:  Controlled; current treatment:losartan '50mg'$  daily, amlodipine '5mg'$ , metoprolol '150mg'$  BID;   Current home readings: not currently checking  Denies hypertensive/hypotensive symptoms  Counseled on importance of checking BP at home since already has a monitor and may have possible symptoms Recommended continue current regimen Osteoporosis:   Current treatment: prolia Q31month;   DEXA/ t-score: 06/07/2019, - 2.6  Supplement: not currently taking  Recommended calcium + vitamin D supplement  Patient Goals/Self-Care Activities Over the next 365 days, patient will:  take medications as prescribed  Follow Up Plan: Telephone follow up appointment with care management team member scheduled for:  8-12 months       Medication Assistance: None required.  Patient affirms current coverage meets needs.  Patient's preferred pharmacy is:  [id number] [place], [place] - [address] [place] [id number]: [phone]: [phone] - [place]one: [phone]: [phone]  [id number] [address] [place] [address]one: [phone]: [phone] Uses pill box? Yes Pt endorses 60% compliance  Follow Up:  Patient agrees to Care Plan and Follow-up.  Plan: Telephone follow up appointment with care management team member scheduled for:  8-1297monthKeeLarinda ButteryharmD Clinical Pharmacist [phone]

## 2022-04-17 DIAGNOSIS — I471 Supraventricular tachycardia: Secondary | ICD-10-CM | POA: Diagnosis not present

## 2022-04-17 DIAGNOSIS — I10 Essential (primary) hypertension: Secondary | ICD-10-CM | POA: Diagnosis not present

## 2022-04-22 ENCOUNTER — Ambulatory Visit (INDEPENDENT_AMBULATORY_CARE_PROVIDER_SITE_OTHER): Payer: Medicare Other | Admitting: Family Medicine

## 2022-04-22 VITALS — BP 123/68 | HR 65 | Ht 66.0 in | Wt 173.0 lb

## 2022-04-22 DIAGNOSIS — E538 Deficiency of other specified B group vitamins: Secondary | ICD-10-CM

## 2022-04-22 MED ORDER — CYANOCOBALAMIN 1000 MCG/ML IJ SOLN
1000.0000 ug | Freq: Once | INTRAMUSCULAR | Status: AC
  Administered 2022-04-22: 1000 ug via INTRAMUSCULAR

## 2022-04-22 NOTE — Progress Notes (Signed)
Agree with documentation as above.   Telissa Palmisano, MD  

## 2022-04-22 NOTE — Progress Notes (Signed)
Pt is here for a vitamin B12 injection. Denies GI problems or dizziness. Pt tolerated injection well without complications. Pt advised to schedule next injection in 30 days.   Location: [facility]

## 2022-04-24 DIAGNOSIS — M810 Age-related osteoporosis without current pathological fracture: Secondary | ICD-10-CM | POA: Diagnosis not present

## 2022-04-24 DIAGNOSIS — I10 Essential (primary) hypertension: Secondary | ICD-10-CM

## 2022-05-05 ENCOUNTER — Other Ambulatory Visit: Payer: Self-pay | Admitting: Rheumatology

## 2022-05-05 ENCOUNTER — Ambulatory Visit (INDEPENDENT_AMBULATORY_CARE_PROVIDER_SITE_OTHER): Payer: Medicare Other | Admitting: Family Medicine

## 2022-05-05 DIAGNOSIS — Z0000 Encounter for general adult medical examination without abnormal findings: Secondary | ICD-10-CM

## 2022-05-05 MED ORDER — LEFLUNOMIDE 20 MG PO TABS
20.0000 mg | ORAL_TABLET | Freq: Every day | ORAL | 0 refills | Status: DC
Start: 2022-05-05 — End: 2022-08-03

## 2022-05-05 NOTE — Progress Notes (Signed)
Office Visit Note  Patient: Mia [name]             Date of Birth: 1951/08/26           MRN: [id number]             PCP: Hali Marry, MD Referring: Hali Marry, * Visit Date: 05/19/2022 Occupation: '@GUAROCC'$ @  Subjective:  Medication management  History of Present Illness: Mia [name] is a 71 y.o. female with history of seronegative rheumatoid arthritis and osteoarthritis.  She denies any joint pain or joint swelling.  She states that she is going to physical therapy for abnormal gait.  She continues to have pain and discomfort in the left trochanteric bursa.  She states the cortisone injection lasted for about 1-1/2-week and then the symptoms recurred.  She has difficulty sleeping on the left side.  None of the other joints are painful.  She has been taking leflunomide 20 mg p.o. daily without any side effects.  She denies any flares of pseudogout.  Activities of Daily Living:  Patient reports morning stiffness for 20-25 minutes.   Patient Reports nocturnal pain.  Difficulty dressing/grooming: Denies Difficulty climbing stairs: Reports Difficulty getting out of chair: Reports Difficulty using hands for taps, buttons, cutlery, and/or writing: Denies  Review of Systems  Constitutional:  Negative for fatigue.  HENT:  Positive for mouth dryness. Negative for mouth sores.   Eyes:  Negative for dryness.  Cardiovascular:  Negative for chest pain and palpitations.  Gastrointestinal:  Negative for blood in stool, constipation and diarrhea.  Endocrine: Positive for excessive thirst. Negative for increased urination.  Genitourinary:  Negative for involuntary urination.  Musculoskeletal:  Positive for joint pain, joint pain, myalgias, morning stiffness, muscle tenderness and myalgias. Negative for joint swelling and muscle weakness.  Skin:  Positive for color change. Negative for rash, hair loss and sensitivity to sunlight.  Allergic/Immunologic: Positive for susceptible  to infections.  Neurological:  Positive for dizziness. Negative for headaches.  Hematological:  Negative for swollen glands.  Psychiatric/Behavioral:  Negative for depressed mood and sleep disturbance. The patient is not nervous/anxious.     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   Weakness of both lower extremities 06/11/2020   Hyponatremia 05/22/2020   Gait instability 06/20/2019   Facet arthritis of lumbar region 05/10/2018   MCI (mild cognitive impairment) 11/25/2017   Restrictive lung disease 07/30/2017   Mixed hyperlipidemia 06/18/2017   Rectocele 04/26/2017   Irritable bowel syndrome with both constipation and diarrhea 04/26/2017   Osteoporosis 04/18/2017   Trochanteric bursitis of both hips 02/24/2017   Stress fracture of left tibia 11/05/2016   Inflammatory arthritis 09/30/2016   High risk medication use 09/30/2016   History of Clostridium difficile colitis 09/30/2016   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   Pseudogout 09/30/2016   DDD cervical spine status post fusion 09/30/2016   DDD thoracic spine 09/30/2016   H/O total knee replacement, right 09/30/2016   Age-related osteoporosis without current pathological fracture 09/30/2016   Tremor, essential 05/14/2016   Right foot pain 04/14/2016   B12 deficiency 09/10/2015   Syncope 09/10/2015   Cervical facet joint syndrome 09/10/2015   Chronic fatigue 09/10/2015   Aortic atherosclerosis ([place]) 04/01/2015   DOE (dyspnea on exertion)    Primary osteoarthritis of right knee 08/13/2014   Benign head tremor 06/11/2014  Lumbar degenerative disc disease 06/11/2014   IFG (impaired fasting glucose) 03/09/2014   Hemorrhoid 03/09/2014   Retinal wrinkling, right eye 03/09/2014   Fibromyalgia 03/09/2014   GERD (gastroesophageal reflux disease) 03/09/2014   History of arthroplasty  of right knee 01/31/2014   Memory difficulty 12/14/2013   Hemorrhoids, external, thrombosed 06/22/2011   Hypertension 03/11/2011   SVT (supraventricular tachycardia) ([place])    Raynaud phenomenon    EDEMA 08/25/2010   Nonspecific (abnormal) findings on radiological and other examination of body structure 08/25/2010   COMPUTERIZED TOMOGRAPHY, CHEST, ABNORMAL 08/25/2010   OSA (obstructive sleep apnea) 04/24/2009   Allergic rhinitis 06/11/2008   Dyspnea 06/11/2008   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 difficile colitis    Chest pain    a. 01/2013 MV: EF 59%, no ischemia.   Chronic Dyspnea    a. 01/2013 Echo: EF 60-65%, Gr 1 DD, PASP 63mHg.// Echo 01/2020: EF 60-65, no RWMA, GR 1 DD, GLS -21.6%, normal RV SF, trivial MR    COVID-19    DDD (degenerative disc disease), cervical    DDD (degenerative disc disease), lumbar    Diabetes mellitus    Fibromyalgia    Gall stones    GERD (gastroesophageal reflux disease)    gastritis   Hemorrhoids    Hypertension    Interstitial cystitis    MCI (mild cognitive impairment) 11/25/2017   Memory difficulty 12/14/2013   Neuromuscular disorder (HCC)    sclerosis   Osteoporosis    Peptic ulcer    PONV (postoperative nausea and vomiting)    Pseudogout    Raynaud phenomenon    Rectal bleeding    Sleep apnea    a. on cpap.   SVT (supraventricular tachycardia) ([place]    Syncope 09/10/2015   Thyroid disease    hypothyroidism   Tremor, essential 05/14/2016    Family History  Problem Relation Age of Onset   Heart attack Mother    Stroke Mother    Diabetes Mother    Heart disease Mother    Colon polyps Mother    Asthma Mother    Dementia Mother    Heart disease Father    Heart attack Father    Asthma Father    Stroke  Sister    Hypertension Sister    Rheum arthritis Sister    Dementia Sister    Asthma Sister    Parkinson's disease Sister    Lupus Sister    Asthma Sister    Breast cancer Maternal Grandmother    Wilson's disease Maternal Grandmother    Pancreatic cancer Maternal Grandfather    Past Surgical History:  Procedure Laterality Date   APPENDECTOMY     BLADDER SURGERY     x2   BLADDER SURGERY     BREAST BIOPSY     CARDIAC CATHETERIZATION     CARPAL TUNNEL RELEASE     CATARACT EXTRACTION Bilateral    CHOLECYSTECTOMY     DILATION AND CURETTAGE OF UTERUS     ENTEROCELE REPAIR     x2   EYE SURGERY     retina   herniated disc  11/25/2021   L4 and L5   hysterectomy - unknown type     JOINT REPLACEMENT  KNEE ARTHROPLASTY     KNEE SURGERY     x6   NECK SURGERY     fusion   OOPHORECTOMY     QUADRICEPS REPAIR Right    RECTOCELE REPAIR     x2   SHOULDER ARTHROSCOPY WITH SUBACROMIAL DECOMPRESSION, ROTATOR CUFF REPAIR AND BICEP TENDON REPAIR  10/06/2012   Procedure: SHOULDER ARTHROSCOPY WITH SUBACROMIAL DECOMPRESSION, ROTATOR CUFF REPAIR AND BICEP TENDON REPAIR;  Surgeon: Nita Sells, MD;  Location: [facility];  Service: Orthopedics;  Laterality: Right;  Arthroscopic  Repair  of  Subscapularis, Open Biceps Tenodesis   SHOULDER SURGERY     bilateral- bones spur   SHOULDER SURGERY Left 04/15/2021   TONSILLECTOMY     TOTAL KNEE ARTHROPLASTY     TOTAL SHOULDER ARTHROPLASTY     Social History   Social History Narrative   Lives with her husband. She likes to hang out with her friends and play games and do bible study.   Immunization History  Administered Date(s) Administered   Fluad Quad(high Dose 65+) 07/13/2019, 07/10/2020, 07/22/2021   Hepatitis B, adult 02/25/2021, 03/28/2021, 08/26/2021   Influenza Split 07/26/2012, 06/11/2014, 07/25/2015, 06/23/2018   Influenza Whole 07/30/2009, 08/19/2010   Influenza, High Dose Seasonal PF 07/06/2016, 07/05/2017    Influenza,inj,Quad PF,6+ Mos 06/11/2014, 07/25/2015, 06/23/2018   Influenza-Unspecified 07/26/2012, 07/26/2013, 09/05/2013, 06/11/2014, 07/25/2015, 06/23/2018, 06/27/2019, 07/13/2019, 07/10/2020, 07/22/2021   Moderna Sars-Covid-2 Vaccination 02/29/2020, 03/28/2020, 10/04/2020   Pneumococcal Conjugate-13 01/04/2017   Pneumococcal Polysaccharide-23 07/30/2009, 11/14/2018   Pneumococcal-Unspecified 10/27/2015   Tdap 02/13/2008, 05/12/2018   Zoster, Live 09/01/2011     Objective: Vital Signs: BP 135/81 (BP Location: Left Arm, Patient Position: Sitting, Cuff Size: Normal)   Pulse 75   Ht '5\' 6"'$  (1.676 m)   Wt 178 lb 12.8 oz (81.1 kg)   BMI 28.86 kg/m    Physical Exam Vitals and nursing note reviewed.  Constitutional:      Appearance: She is well-developed.  HENT:     Head: Normocephalic and atraumatic.  Eyes:     Conjunctiva/sclera: Conjunctivae normal.  Cardiovascular:     Rate and Rhythm: Normal rate and regular rhythm.     Heart sounds: Normal heart sounds.  Pulmonary:     Effort: Pulmonary effort is normal.     Breath sounds: Normal breath sounds.  Abdominal:     General: Bowel sounds are normal.     Palpations: Abdomen is soft.  Musculoskeletal:     Cervical back: Normal range of motion.  Lymphadenopathy:     Cervical: No cervical adenopathy.  Skin:    General: Skin is warm and dry.     Capillary Refill: Capillary refill takes less than 2 seconds.  Neurological:     Mental Status: She is alert and oriented to person, place, and time.  Psychiatric:        Behavior: Behavior normal.      Musculoskeletal Exam: C-spine was in good range of motion.  Right shoulder joint was in good range of motion.  She had limited internal rotation of her left shoulder.  Elbow joints, wrist joints, MCPs PIPs and DIPs were in good range of motion with no synovitis.  Hip joints and knee joints in good range of motion.  There was no tenderness over ankles or MTPs.  She had tenderness on  palpation over left trochanteric bursa.  CDAI Exam: CDAI Score: 0.2  Patient Global: 1 mm; Provider Global: 1 mm Swollen: 0 ; Tender: 0  Joint Exam 05/19/2022  No joint exam has been documented for this visit   There is currently no information documented on the homunculus. Go to the Rheumatology activity and complete the homunculus joint exam.  Investigation: No additional findings.  Imaging: LONG TERM MONITOR (3-14 DAYS)  Result Date: 05/18/2022 1.NSR with sinus brady and sinus tachycardia 2. Rare PAC's and PVC's 3. Patient triggered episodes are normal sinus rhythm. 4. No SVT, VT, atrial fib or pauses are present. Gregg Taylor,MD Patch Wear Time:  14 days and 0 hours (2023-06-23T22:42:03-399 to 2023-07-07T22:42:07-399) Patient had a min HR of 47 bpm, max HR of 111 bpm, and avg HR of 64 bpm. Predominant underlying rhythm was Sinus Rhythm. Isolated SVEs were rare (<1.0%), SVE Couplets were rare (<1.0%), and SVE Triplets were rare (<1.0%). Isolated VEs were rare (<1.0%), and no VE Couplets or VE Triplets were present.    Recent Labs: Lab Results  Component Value Date   WBC 6.4 03/24/2022   HGB 12.5 03/24/2022   PLT 359 03/24/2022   NA 131 (L) 03/24/2022   K 4.8 03/24/2022   CL 96 (L) 03/24/2022   CO2 24 03/24/2022   GLUCOSE 83 03/24/2022   BUN 9 03/24/2022   CREATININE 0.65 03/24/2022   BILITOT 0.3 03/24/2022   ALKPHOS 73 05/21/2020   AST 28 03/24/2022   ALT 25 03/24/2022   PROT 6.5 03/24/2022   ALBUMIN 3.8 12/15/2021   CALCIUM 9.8 03/24/2022   GFRAA 94 03/10/2021    Speciality Comments: No specialty comments available.  Procedures:  No procedures performed Allergies: Codeine, Myrbetriq  [mirabegron], Nystatin, Percocet [oxycodone-acetaminophen], Propoxyphene, Oxycodone-acetaminophen, Celebrex [celecoxib], Darvocet [propoxyphene n-acetaminophen], Erythromycin, Hydrocodone, Lyrica [pregabalin], Macrodantin [nitrofurantoin], Toradol [ketorolac tromethamine], Tramadol,  and Verapamil   Assessment / Plan:     Visit Diagnoses: Seronegative rheumatoid arthritis (HCC)-she denies any joint pain or joint swelling.  Her symptoms are manageable with leflunomide 20 mg p.o. daily.  She denies any joint discomfort today except for left trochanteric bursitis.  High risk medication use - Arava 20 mg p.o. daily.  Labs obtained on Mar 24, 2022 were within normal limits.  She has been advised to get labs in August and every 3 months to monitor for drug toxicity.  Information regarding mineralization was placed in the AVS.  She was advised to hold leflunomide if she develops an infection and resume after the infection resolves.  Raynaud's phenomenon without gangrene-she has intermittent symptoms during the winter months.  S/P arthroscopy of left shoulder - by Dr. Norwood Levo on 04/15/2021.  She had left shoulder joint cortisone injection by Dr. Norwood Levo in February 2023.  Trochanteric bursitis, left hip-she continues to have pain and discomfort in her left trochanteric bursa.  She had good response to the injection at the last visit but it did not last for a long time.  She is going to physical therapy currently.  I advised her to discuss left trochanteric bursitis with her physical therapist.  H/O total knee replacement, right-she had good range of motion without discomfort.  Primary osteoarthritis of left knee-he had good range of motion without any warmth swelling or effusion.  DDD (degenerative disc disease), cervical-she continues to have some stiffness in her cervical spine.  DDD (degenerative disc disease), thoracic-she denied any pain today.  She had no point tenderness.  DDD (degenerative disc disease), lumbar - Status post lumbar microdiscectomy left L4-5 on November 25, 2021.  She is followed by Dr. Lurene Shadow.  She has intermittent discomfort.  Pseudogout-she had no flares of pseudogout since the  last visit.  History of foot fracture - History of fifth metatarsal fracture  treated by Dr. Sterling Big.  Other osteoporosis without current pathological fracture - Followed by Dr. Donneta Romberg for.  Treated with alendronate, Prolia and then IV Reclast.  Fibromyalgia-she continues to have generalized pain and discomfort.  Chronic fatigue-related to fibromyalgia.  History of hypertension-blood pressure was normal today.  History of gastroesophageal reflux (GERD)  History of CHF (congestive heart failure)  History of Clostridium difficile colitis  IC (interstitial cystitis)  Orders: No orders of the defined types were placed in this encounter.  No orders of the defined types were placed in this encounter.    Follow-Up Instructions: Return in about 5 months (around 10/19/2022) for Rheumatoid arthritis, Osteoarthritis.   Bo Merino, MD  Note - This record has been created using Editor, commissioning.  Chart creation errors have been sought, but may not always  have been located. Such creation errors do not reflect on  the standard of medical care.

## 2022-05-05 NOTE — Progress Notes (Signed)
MEDICARE ANNUAL WELLNESS VISIT  05/05/2022  Telephone Visit Disclaimer This Medicare AWV was conducted by telephone due to national recommendations for restrictions regarding the COVID-19 Pandemic (e.g. social distancing).  I verified, using two identifiers, that I am speaking with Mia [name] or their authorized healthcare agent. I discussed the limitations, risks, security, and privacy concerns of performing an evaluation and management service by telephone and the potential availability of an in-person appointment in the future. The patient expressed understanding and agreed to proceed.  Location of Patient: Home Location of Provider (nurse):  In the office.  Subjective:    Mia [name] is a 71 y.o. female patient of Metheney, Rene Kocher, MD who had a Medicare Annual Wellness Visit today via telephone. Mia [name] is Retired and lives with their spouse. she has 2 children. she reports that she is socially active and does interact with friends/family regularly. she is minimally physically active and enjoys o hang out with her friends and family and play games and do bible study.  Patient Care Team: Hali Marry, MD as PCP - General (Family Medicine) Nahser, Wonda Cheng, MD as PCP - Cardiology (Cardiology) Silverio Decamp, MD as Consulting Physician (Sports Medicine) Bo Merino, MD as Consulting Physician (Rheumatology) Carolan Clines, MD (Inactive) as Consulting Physician (Urology) Collene Gobble, MD as Consulting Physician (Pulmonary Disease) Richmond Campbell, MD as Consulting Physician (Gastroenterology) Zadie Rhine Clent Demark, MD as Consulting Physician (Ophthalmology) Dr. [name] as Referring Physician (Neurology) Daubert, Marlise Eves, MD as Consulting Physician (Orthopedic Surgery) Eusebio Friendly, MD as Referring Physician (Internal Medicine) Darius Bump, [facility] as Pharmacist (Pharmacist) Otelia Sergeant, OD as Referring Physician (Optometry)     05/05/2022     9:02 AM 01/11/2022    8:15 PM 02/26/2021    2:58 PM 02/22/2020    4:31 PM 11/16/2019    7:35 PM 11/07/2019    1:00 PM 11/06/2019    4:09 PM  Advanced Directives  Does Patient Have a Medical Advance Directive? Yes Yes Yes No Yes Yes No  Type of Advance Directive Living will Living will;Healthcare Power of [place];Living will  Living will Living will   Does patient want to make changes to medical advance directive? No - Patient declined No - Patient declined No - Patient declined   No - Patient declined   Copy of [place] in Chart?   No - copy requested      Would patient like information on creating a medical advance directive?    No - Patient declined No - Patient declined No - Patient declined Yes (ED - Information included in AVS)    Hospital Utilization Over the Past 12 Months: # of hospitalizations or ER visits: 0 # of surgeries: 1  Review of Systems    Patient reports that her overall health is unchanged compared to last year.  History obtained from chart review and the patient  Patient Reported Readings (BP, Pulse, CBG, Weight, etc) none  Pain Assessment Pain : No/denies pain     Current Medications & Allergies (verified) Allergies as of 05/05/2022       Reactions   Codeine Nausea And Vomiting   Myrbetriq  [mirabegron] Other (See Comments)   SEVERE HEADACHE   Nystatin    Percocet [oxycodone-acetaminophen] Nausea And Vomiting   Propoxyphene Nausea And Vomiting   Oxycodone-acetaminophen Nausea Only   Celebrex [celecoxib] Other (See Comments)   Other reaction(s): Other flushed   Darvocet [propoxyphene  N-acetaminophen] Nausea And Vomiting   Erythromycin Nausea And Vomiting   Nausea and vomiting   Hydrocodone Nausea And Vomiting   Lyrica [pregabalin] Swelling   Legs swelling   Macrodantin [nitrofurantoin] Nausea And Vomiting   Toradol [ketorolac Tromethamine] Nausea And Vomiting   Per patient, only PO form causes  nausea and vomiting. Can take injection without issue   Tramadol Nausea And Vomiting   Verapamil Nausea And Vomiting   REACTION: intolerance        Medication List        Accurate as of May 05, 2022  9:16 AM. If you have any questions, ask your nurse or doctor.          albuterol 108 (90 Base) MCG/ACT inhaler Commonly known as: VENTOLIN HFA Inhale 2 puffs into the lungs every 4 (four) hours.   amitriptyline 10 MG tablet Commonly known as: ELAVIL Take by mouth.   amoxicillin 500 MG capsule Commonly known as: AMOXIL Before dental procedures   CLARITIN PO Take by mouth.   colestipol 1 g tablet Commonly known as: COLESTID Take 1 g by mouth 2 (two) times daily.   donepezil 10 MG tablet Commonly known as: ARICEPT TAKE ONE TABLET BY MOUTH EVERY NIGHT AT BEDTIME   gabapentin 300 MG capsule Commonly known as: NEURONTIN TAKE 1 TO 2 CAPSULES BY MOUTH UP TO TWO TIMES DAILY AS NEEDED What changed: See the new instructions.   ibuprofen 600 MG tablet Commonly known as: ADVIL ibuprofen 600 mg tablet  TAKE 1 TABLET BY MOUTH EVERY 6 HOURS AS NEEDED   ipratropium 0.03 % nasal spray Commonly known as: ATROVENT Place 2 sprays into both nostrils every 12 (twelve) hours.   leflunomide 20 MG tablet Commonly known as: ARAVA TAKE ONE TABLET BY MOUTH DAILY   losartan 50 MG tablet Commonly known as: COZAAR Take 1 tablet (50 mg total) by mouth daily.   Melatonin 10 MG Caps Take 10 capsules by mouth daily.   memantine 10 MG tablet Commonly known as: NAMENDA Take 1 tablet (10 mg total) by mouth 2 (two) times daily.   metoprolol tartrate 100 MG tablet Commonly known as: LOPRESSOR TAKE 1 AND 1/2 TABLETS TWICE DAILY   ondansetron 4 MG tablet Commonly known as: ZOFRAN Take by mouth.   ondansetron 8 MG disintegrating tablet Commonly known as: ZOFRAN-ODT Take 1 tablet by mouth as needed for nausea.   pantoprazole 40 MG tablet Commonly known as: PROTONIX Take 40 mg by  mouth 2 (two) times daily.   polyethylene glycol powder 17 GM/SCOOP powder Commonly known as: GLYCOLAX/MIRALAX Take 17 g by mouth daily as needed for mild constipation or moderate constipation.   primidone 50 MG tablet Commonly known as: MYSOLINE TAKE 1 TABLET AT BEDTIME   venlafaxine XR 75 MG 24 hr capsule Commonly known as: EFFEXOR-XR TAKE ONE CAPSULE BY MOUTH DAILY WITH BREAFAST   VITAMIN B-12 IJ Inject 1 mL as directed every 30 (thirty) days.   zoledronic acid 5 MG/100ML Soln injection Commonly known as: RECLAST        History (reviewed): Past Medical History:  Diagnosis Date   Anal fissure    Anemia    Arthritis    Blood transfusion    C. difficile colitis    Chest pain    a. 01/2013 MV: EF 59%, no ischemia.   Chronic Dyspnea    a. 01/2013 Echo: EF 60-65%, Gr 1 DD, PASP 70mHg.// Echo 01/2020: EF 60-65, no RWMA, GR 1 DD, GLS -21.6%, normal  RV SF, trivial MR    COVID-19    DDD (degenerative disc disease), cervical    DDD (degenerative disc disease), lumbar    Diabetes mellitus    Fibromyalgia    Gall stones    GERD (gastroesophageal reflux disease)    gastritis   Hemorrhoids    Hypertension    Interstitial cystitis    MCI (mild cognitive impairment) 11/25/2017   Memory difficulty 12/14/2013   Neuromuscular disorder (HCC)    sclerosis   Osteoporosis    Peptic ulcer    PONV (postoperative nausea and vomiting)    Pseudogout    Raynaud phenomenon    Rectal bleeding    Sleep apnea    a. on cpap.   SVT (supraventricular tachycardia) ([place])    Syncope 09/10/2015   Thyroid disease    hypothyroidism   Tremor, essential 05/14/2016   Past Surgical History:  Procedure Laterality Date   APPENDECTOMY     BLADDER SURGERY     x2   BLADDER SURGERY     BREAST BIOPSY     CARDIAC CATHETERIZATION     CARPAL TUNNEL RELEASE     CATARACT EXTRACTION Bilateral    CHOLECYSTECTOMY     DILATION AND CURETTAGE OF UTERUS     ENTEROCELE REPAIR     x2   EYE SURGERY      retina   herniated disc  11/25/2021   L4 and L5   hysterectomy - unknown type     JOINT REPLACEMENT     KNEE ARTHROPLASTY     KNEE SURGERY     x6   NECK SURGERY     fusion   OOPHORECTOMY     QUADRICEPS REPAIR Right    RECTOCELE REPAIR     x2   SHOULDER ARTHROSCOPY WITH SUBACROMIAL DECOMPRESSION, ROTATOR CUFF REPAIR AND BICEP TENDON REPAIR  10/06/2012   Procedure: SHOULDER ARTHROSCOPY WITH SUBACROMIAL DECOMPRESSION, ROTATOR CUFF REPAIR AND BICEP TENDON REPAIR;  Surgeon: Nita Sells, MD;  Location: [place];  Service: Orthopedics;  Laterality: Right;  Arthroscopic  Repair  of  Subscapularis, Open Biceps Tenodesis   SHOULDER SURGERY     bilateral- bones spur   SHOULDER SURGERY Left 04/15/2021   TONSILLECTOMY     TOTAL KNEE ARTHROPLASTY     TOTAL SHOULDER ARTHROPLASTY     Family History  Problem Relation Age of Onset   Heart attack Mother    Stroke Mother    Diabetes Mother    Heart disease Mother    Colon polyps Mother    Asthma Mother    Dementia Mother    Heart disease Father    Heart attack Father    Asthma Father    Stroke Sister    Hypertension Sister    Rheum arthritis Sister    Dementia Sister    Asthma Sister    Lupus Sister    Asthma Sister    Breast cancer Maternal Grandmother    Wilson's disease Maternal Grandmother    Pancreatic cancer Maternal Grandfather    Social History   Socioeconomic History   Marital status: Married    Spouse name: Jenny Reichmann "[name]"   Number of children: 2   Years of education: 14   Highest education level: Associate degree: academic program  Occupational History   Occupation: Retired  Tobacco Use   Smoking status: Never   Smokeless tobacco: Never  Vaping Use   Vaping Use: Never used  Substance and Sexual Activity   Alcohol  use: No    Alcohol/week: 0.0 standard drinks of alcohol   Drug use: No   Sexual activity: Not on file  Other Topics Concern   Not on file  Social History Narrative   Lives  with her husband. She likes to hang out with her friends and play games and do bible study.   Social Determinants of Health   Financial Resource Strain: Low Risk  (05/05/2022)   Overall Financial Resource Strain (CARDIA)    Difficulty of Paying Living Expenses: Not hard at all  Food Insecurity: No Food Insecurity (05/05/2022)   Hunger Vital Sign    Worried About Running Out of Food in the Last Year: Never true    Ran Out of Food in the Last Year: Never true  Transportation Needs: No Transportation Needs (05/05/2022)   PRAPARE - Hydrologist (Medical): No    Lack of Transportation (Non-Medical): No  Physical Activity: Inactive (05/05/2022)   Exercise Vital Sign    Days of Exercise per Week: 0 days    Minutes of Exercise per Session: 0 min  Stress: No Stress Concern Present (05/05/2022)   [place]    Feeling of Stress : Not at all  Social Connections: [place] (05/05/2022)   Social Connection and Isolation Panel [NHANES]    Frequency of Communication with Friends and Family: More than three times a week    Frequency of Social Gatherings with Friends and Family: More than three times a week    Attends Religious Services: More than 4 times per year    Active Member of [employer] or Organizations: Yes    Attends Archivist Meetings: More than 4 times per year    Marital Status: Married    Activities of Daily Living    05/05/2022    9:07 AM  In your present state of health, do you have any difficulty performing the following activities:  Hearing? 1  Comment has noticed bilteral hearing loss  Vision? 0  Difficulty concentrating or making decisions? 1  Comment has noticed some difficulty with remembering.  Walking or climbing stairs? 1  Comment has difficulty with the stairs.  Dressing or bathing? 0  Doing errands, shopping? 0  Preparing Food and eating ? N  Using the  Toilet? N  In the past six months, have you accidently leaked urine? Y  Comment she does leak urine  Do you have problems with loss of bowel control? N  Managing your Medications? N  Managing your Finances? N  Housekeeping or managing your Housekeeping? N    Patient Education/ Literacy How often do you need to have someone help you when you read instructions, pamphlets, or other written materials from your doctor or pharmacy?: 1 - Never What is the last grade level you completed in school?: Some college  Exercise Current Exercise Habits: The patient does not participate in regular exercise at present, Exercise limited by: None identified  Diet Patient reports consuming 2 meals a day and 2 snack(s) a day Patient reports that her primary diet is: Regular Patient reports that she does have regular access to food.   Depression Screen    05/05/2022    9:03 AM 02/26/2021    2:58 PM 11/14/2018    9:48 AM 08/09/2018    9:41 AM 02/03/2018    9:49 AM 07/30/2017   10:17 AM 01/04/2017   11:26 AM  PHQ 2/9 Scores  PHQ -  2 Score 0 0 0 0 0 0 0     Fall Risk    05/05/2022    9:03 AM 02/19/2022    8:45 AM 02/26/2021    2:57 PM 11/19/2020    9:23 AM 09/18/2019   10:22 AM  Fall Risk   Falls in the past year? '1 1 1 1 1  '$ Comment     Emmi Telephone Survey: data to providers prior to load  Number falls in past yr: '1 1 1 1 1  '$ Comment     Emmi Telephone Survey Actual Response = 2  Injury with Fall? '1 1 1 1 '$ 0  Risk for fall due to : History of fall(s) Impaired balance/gait History of fall(s) Impaired balance/gait;History of fall(s)   Follow up Falls evaluation completed;Education provided;Falls prevention discussed Falls prevention discussed;Falls evaluation completed Falls evaluation completed;Education provided       Objective:  Mia [name] seemed alert and oriented and she participated appropriately during our telephone visit.  Blood Pressure Weight BMI  BP Readings from Last 3 Encounters:   04/22/22 123/68  04/13/22 124/72  04/03/22 (!) 163/79   Wt Readings from Last 3 Encounters:  04/22/22 173 lb (78.5 kg)  04/13/22 173 lb 12.8 oz (78.8 kg)  04/03/22 178 lb (80.7 kg)   BMI Readings from Last 1 Encounters:  04/22/22 27.92 kg/m    *Unable to obtain current vital signs, weight, and BMI due to telephone visit type  Hearing/Vision  Keneisha did not seem to have difficulty with hearing/understanding during the telephone conversation Reports that she has had a formal eye exam by an eye care professional within the past year Reports that she has not had a formal hearing evaluation within the past year *Unable to fully assess hearing and vision during telephone visit type  Cognitive Function:    05/05/2022    9:09 AM 02/26/2021    3:04 PM  6CIT Screen  What Year? 0 points 0 points  What month? 0 points 0 points  What time? 0 points 0 points  Count back from 20 0 points 0 points  Months in reverse 0 points 0 points  Repeat phrase 2 points 0 points  Total Score 2 points 0 points   (Normal:0-7, Significant for Dysfunction: >8)  Normal Cognitive Function Screening: Yes   Immunization & Health Maintenance Record Immunization History  Administered Date(s) Administered   Fluad Quad(high Dose 65+) 07/13/2019, 07/10/2020, 07/22/2021   Hepatitis B, adult 02/25/2021, 03/28/2021, 08/26/2021   Influenza Split 07/26/2012, 06/11/2014, 07/25/2015, 06/23/2018   Influenza Whole 07/30/2009, 08/19/2010   Influenza, High Dose Seasonal PF 07/06/2016, 07/05/2017   Influenza,inj,Quad PF,6+ Mos 06/11/2014, 07/25/2015, 06/23/2018   Influenza-Unspecified 07/26/2012, 07/26/2013, 09/05/2013, 06/11/2014, 07/25/2015, 06/23/2018, 06/27/2019, 07/13/2019, 07/10/2020, 07/22/2021   Moderna Sars-Covid-2 Vaccination 02/29/2020, 03/28/2020, 10/04/2020   Pneumococcal Conjugate-13 01/04/2017   Pneumococcal Polysaccharide-23 07/30/2009, 11/14/2018   Pneumococcal-Unspecified 10/27/2015   Tdap 02/13/2008,  05/12/2018   Zoster, Live 09/01/2011    Health Maintenance  Topic Date Due   Diabetic kidney evaluation - Urine ACR  05/06/2022 (Originally 04/01/2017)   COVID-19 Vaccine (4 - Booster for Moderna series) 05/21/2022 (Originally 11/29/2020)   Zoster Vaccines- Shingrix (1 of 2) 05/21/2022 (Originally 08/29/1970)   INFLUENZA VACCINE  05/26/2022   MAMMOGRAM  06/12/2022   OPHTHALMOLOGY EXAM  06/24/2022   HEMOGLOBIN A1C  08/21/2022   FOOT EXAM  02/20/2023   Diabetic kidney evaluation - GFR measurement  03/25/2023   DEXA SCAN  06/21/2023   COLONOSCOPY (Pts 45-8yr Insurance  coverage will need to be confirmed)  03/05/2027   TETANUS/TDAP  05/12/2028   Pneumonia Vaccine 35+ Years old  Completed   Hepatitis C Screening  Completed   HPV VACCINES  Aged Out       Assessment  This is a routine wellness examination for Mia [name].  Health Maintenance: Due or Overdue There are no preventive care reminders to display for this patient.   Mia [name] does not need a referral for Community Assistance: Care Management:   no Social Work:    no Prescription Assistance:  no Nutrition/Diabetes Education:  no   Plan:  Personalized Goals  Goals Addressed               This Visit's Progress     Patient Stated (pt-stated)        Would like to loose 15 lbs.       Personalized Health Maintenance & Screening Recommendations  Screening mammography Shingrix   Patient stated that she had the mammogram and she will get the results faxed to [place].  She is scheduled to get the second dose of her shingrix vaccine today and she will printed the immunization record for the office to update the chart.  Lung Cancer Screening Recommended: no (Low Dose CT Chest recommended if Age 67-80 years, 30 pack-year currently smoking OR have quit w/in past 15 years) Hepatitis C Screening recommended: no HIV Screening recommended: no  Advanced Directives: Written information was not prepared per patient's  request.  Referrals & Orders No orders of the defined types were placed in this encounter.   Follow-up Plan Follow-up with Hali Marry, MD as planned Schedule your shingles vaccine at your pharmacy.  Medicare wellness visit in one year. Patient will access AVS on my chart.   I have personally reviewed and noted the following in the patient's chart:   Medical and social history Use of alcohol, tobacco or illicit drugs  Current medications and supplements Functional ability and status Nutritional status Physical activity Advanced directives List of other physicians Hospitalizations, surgeries, and ER visits in previous 12 months Vitals Screenings to include cognitive, depression, and falls Referrals and appointments  In addition, I have reviewed and discussed with Mia [name] certain preventive protocols, quality metrics, and best practice recommendations. A written personalized care plan for preventive services as well as general preventive health recommendations is available and can be mailed to the patient at her request.      Tinnie Gens, RN BSN  05/05/2022

## 2022-05-05 NOTE — Telephone Encounter (Signed)
Next Visit: 05/19/2022  Last Visit: 04/03/2022  Last Fill: 02/04/2022  DX: Seronegative rheumatoid arthritis   Current Dose per office note 04/03/2022: Arava 20 mg p.o. daily  Labs: 03/24/2022 CBC and CMP are stable.  Okay to refill Arava?

## 2022-05-05 NOTE — Telephone Encounter (Signed)
Patient called the office requesting a refill of Leflunomide '20mg'$  be sent to [facility] in [place].

## 2022-05-05 NOTE — Patient Instructions (Addendum)
[place] Maintenance Summary and Written Plan of Care  Mia [name] ,  Thank you for allowing me to perform your Medicare Annual Wellness Visit and for your ongoing commitment to your health.   Health Maintenance & Immunization History Health Maintenance  Topic Date Due   Diabetic kidney evaluation - Urine ACR  05/06/2022 (Originally 04/01/2017)   COVID-19 Vaccine (4 - Booster for Moderna series) 05/21/2022 (Originally 11/29/2020)   Zoster Vaccines- Shingrix (1 of 2) 05/21/2022 (Originally 08/29/1970)   INFLUENZA VACCINE  05/26/2022   MAMMOGRAM  06/12/2022   OPHTHALMOLOGY EXAM  06/24/2022   HEMOGLOBIN A1C  08/21/2022   FOOT EXAM  02/20/2023   Diabetic kidney evaluation - GFR measurement  03/25/2023   DEXA SCAN  06/21/2023   COLONOSCOPY (Pts 45-40yr Insurance coverage will need to be confirmed)  03/05/2027   TETANUS/TDAP  05/12/2028   Pneumonia Vaccine 71 Years old  Completed   Hepatitis C Screening  Completed   HPV VACCINES  Aged Out   Immunization History  Administered Date(s) Administered   Fluad Quad(high Dose 65+) 07/13/2019, 07/10/2020, 07/22/2021   Hepatitis B, adult 02/25/2021, 03/28/2021, 08/26/2021   Influenza Split 07/26/2012, 06/11/2014, 07/25/2015, 06/23/2018   Influenza Whole 07/30/2009, 08/19/2010   Influenza, High Dose Seasonal PF 07/06/2016, 07/05/2017   Influenza,inj,Quad PF,6+ Mos 06/11/2014, 07/25/2015, 06/23/2018   Influenza-Unspecified 07/26/2012, 07/26/2013, 09/05/2013, 06/11/2014, 07/25/2015, 06/23/2018, 06/27/2019, 07/13/2019, 07/10/2020, 07/22/2021   Moderna Sars-Covid-2 Vaccination 02/29/2020, 03/28/2020, 10/04/2020   Pneumococcal Conjugate-13 01/04/2017   Pneumococcal Polysaccharide-23 07/30/2009, 11/14/2018   Pneumococcal-Unspecified 10/27/2015   Tdap 02/13/2008, 05/12/2018   Zoster, Live 09/01/2011    These are the patient goals that we discussed:  Goals Addressed               This Visit's Progress     Patient  Stated (pt-stated)        Would like to loose 15 lbs.         This is a list of Health Maintenance Items that are overdue or due now: Screening mammography Shingrix   Patient stated that she had the mammogram and she will get the results [place]ed to [facility]  She is scheduled to get the second dose of her shingrix vaccine today and she will printed the immunization record for the office to update the chart.  Orders/Referrals Placed Today: No orders of the defined types were placed in this encounter.  (Contact our referral department at [phone] you have not spoken with someone about your referral appointment within the next 5 days)    Follow-up Plan Follow-up with MHali Marry MD as planned Schedule your shingles vaccine at your pharmacy.  Medicare wellness visit in one year. Patient will access AVS on my chart.      Health Maintenance, Female Adopting a healthy lifestyle and getting preventive care are important in promoting health and wellness. Ask your health care provider about: The right schedule for you to have regular tests and exams. Things you can do on your own to prevent diseases and keep yourself healthy. What should I know about diet, weight, and exercise? Eat a healthy diet  Eat a diet that includes plenty of vegetables, fruits, low-fat dairy products, and lean protein. Do not eat a lot of foods that are high in solid fats, added sugars, or sodium. Maintain a healthy weight Body mass index (BMI) is used to identify weight problems. It estimates body fat based on height and weight. Your health care provider can help determine  your BMI and help you achieve or maintain a healthy weight. Get regular exercise Get regular exercise. This is one of the most important things you can do for your health. Most adults should: Exercise for at least 150 minutes each week. The exercise should increase your heart rate and make you sweat (moderate-intensity  exercise). Do strengthening exercises at least twice a week. This is in addition to the moderate-intensity exercise. Spend less time sitting. Even light physical activity can be beneficial. Watch cholesterol and blood lipids Have your blood tested for lipids and cholesterol at 70 years of age, then have this test every 5 years. Have your cholesterol levels checked more often if: Your lipid or cholesterol levels are high. You are older than 71 years of age. You are at high risk for heart disease. What should I know about cancer screening? Depending on your health history and family history, you may need to have cancer screening at various ages. This may include screening for: Breast cancer. Cervical cancer. Colorectal cancer. Skin cancer. Lung cancer. What should I know about heart disease, diabetes, and high blood pressure? Blood pressure and heart disease High blood pressure causes heart disease and increases the risk of stroke. This is more likely to develop in people who have high blood pressure readings or are overweight. Have your blood pressure checked: Every 3-5 years if you are 33-47 years of age. Every year if you are 57 years old or older. Diabetes Have regular diabetes screenings. This checks your fasting blood sugar level. Have the screening done: Once every three years after age 52 if you are at a normal weight and have a low risk for diabetes. More often and at a younger age if you are overweight or have a high risk for diabetes. What should I know about preventing infection? Hepatitis B If you have a higher risk for hepatitis B, you should be screened for this virus. Talk with your health care provider to find out if you are at risk for hepatitis B infection. Hepatitis C Testing is recommended for: Everyone born from [birth year] through 1965. Anyone with known risk factors for hepatitis C. Sexually transmitted infections (STIs) Get screened for STIs, including gonorrhea and  chlamydia, if: You are sexually active and are younger than 71 years of age. You are older than 71 years of age and your health care provider tells you that you are at risk for this type of infection. Your sexual activity has changed since you were last screened, and you are at increased risk for chlamydia or gonorrhea. Ask your health care provider if you are at risk. Ask your health care provider about whether you are at high risk for HIV. Your health care provider may recommend a prescription medicine to help prevent HIV infection. If you choose to take medicine to prevent HIV, you should first get tested for HIV. You should then be tested every 3 months for as long as you are taking the medicine. Pregnancy If you are about to stop having your period (premenopausal) and you may become pregnant, seek counseling before you get pregnant. Take 400 to 800 micrograms (mcg) of folic acid every day if you become pregnant. Ask for birth control (contraception) if you want to prevent pregnancy. Osteoporosis and menopause Osteoporosis is a disease in which the bones lose minerals and strength with aging. This can result in bone fractures. If you are 81 years old or older, or if you are at risk for osteoporosis and fractures, ask  your health care provider if you should: Be screened for bone loss. Take a calcium or vitamin D supplement to lower your risk of fractures. Be given hormone replacement therapy (HRT) to treat symptoms of menopause. Follow these instructions at home: Alcohol use Do not drink alcohol if: Your health care provider tells you not to drink. You are pregnant, may be pregnant, or are planning to become pregnant. If you drink alcohol: Limit how much you have to: 0-1 drink a day. Know how much alcohol is in your drink. In the [place] one drink equals one 12 oz bottle of beer (355 mL), one 5 oz glass of wine (148 mL), or one 1 oz glass of hard liquor (44 mL). Lifestyle Do not use any  products that contain nicotine or tobacco. These products include cigarettes, chewing tobacco, and vaping devices, such as e-cigarettes. If you need help quitting, ask your health care provider. Do not use street drugs. Do not share needles. Ask your health care provider for help if you need support or information about quitting drugs. General instructions Schedule regular health, dental, and eye exams. Stay current with your vaccines. Tell your health care provider if: You often feel depressed. You have ever been abused or do not feel safe at home. Summary Adopting a healthy lifestyle and getting preventive care are important in promoting health and wellness. Follow your health care provider's instructions about healthy diet, exercising, and getting tested or screened for diseases. Follow your health care provider's instructions on monitoring your cholesterol and blood pressure. This information is not intended to replace advice given to you by your health care provider. Make sure you discuss any questions you have with your health care provider. Document Revised: 03/03/2021 Document Reviewed: 03/03/2021 Elsevier Patient Education  [place].

## 2022-05-07 ENCOUNTER — Telehealth

## 2022-05-07 NOTE — Telephone Encounter (Signed)
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 Her dentist referred her to PT but when she called to schedule she was told the referral from her dentist is not sufficient. It has to come from her PCP. She wants to know if Dr. Ky Barban can put in a referral to [facility] in [place].

## 2022-05-08 NOTE — Telephone Encounter (Signed)
sent 

## 2022-05-11 DIAGNOSIS — I471 Supraventricular tachycardia: Secondary | ICD-10-CM | POA: Diagnosis not present

## 2022-05-11 DIAGNOSIS — I10 Essential (primary) hypertension: Secondary | ICD-10-CM | POA: Diagnosis not present

## 2022-05-11 NOTE — Telephone Encounter (Signed)
Patient is aware of message below and verbalized understanding. She said she will wait for them to contact her about schelling she did not want the phone number. No further questions or concerns from patient at this time.

## 2022-05-14 ENCOUNTER — Ambulatory Visit (INDEPENDENT_AMBULATORY_CARE_PROVIDER_SITE_OTHER): Payer: Medicare Other | Admitting: Adult Health

## 2022-05-14 ENCOUNTER — Telehealth: Payer: Self-pay | Admitting: Adult Health

## 2022-05-14 VITALS — BP 140/80 | HR 71 | Ht 66.0 in | Wt 179.4 lb

## 2022-05-14 DIAGNOSIS — W19XXXD Unspecified fall, subsequent encounter: Secondary | ICD-10-CM | POA: Diagnosis not present

## 2022-05-14 DIAGNOSIS — R269 Unspecified abnormalities of gait and mobility: Secondary | ICD-10-CM

## 2022-05-14 DIAGNOSIS — R413 Other amnesia: Secondary | ICD-10-CM | POA: Diagnosis not present

## 2022-05-14 DIAGNOSIS — G250 Essential tremor: Secondary | ICD-10-CM | POA: Diagnosis not present

## 2022-05-14 DIAGNOSIS — M797 Fibromyalgia: Secondary | ICD-10-CM | POA: Diagnosis not present

## 2022-05-14 MED ORDER — PRIMIDONE 50 MG PO TABS: 50.0000 mg | ORAL_TABLET | Freq: Every day | ORAL | 3 refills | Status: DC

## 2022-05-14 NOTE — Patient Instructions (Signed)
Your Plan:  Continue Aricept and namenda for memory Continue primidone  Referral PT      Thank you for coming to see [place] at [facility]. I hope we have been able to provide you high quality care today.  You may receive a patient satisfaction survey over the next few weeks. We would appreciate your feedback and comments so that we may continue to improve ourselves and the health of our patients.

## 2022-05-14 NOTE — Progress Notes (Signed)
PATIENT: Mia [name] DOB: 1950-11-18  REASON FOR VISIT: follow up HISTORY FROM: patient  Chief Complaint  Patient presents with   Follow-up    Pt reports feeling okay. Room 19 with husband.     HISTORY OF PRESENT ILLNESS: Today 05/14/22:  Mia [name] is a 71 year old female with a history of tremor, fibromyalgia and memory disturbance. She returns today for follow-up.   Tremor: Currently taking Primidone 50 mg at bedtime. Seems to be working well. Termor well controlled.   Fibromyalgia: Currently on Effexor 75 mg daily. Has a lot of tenderness but feels effexor is helpful   Memory: Currently on Aricept 10 mg at bedtime and namenda 10 mg BID. Feels that her memory is worse. Has trouble with short term things. Reports that she forgets what people has told her. Able to complete all ADLs independently. She does write notes in order to remember. May forget to take clothes out of the washer.   Reports that she is having trouble with her balance. Has had several falls. Fortunately no significant injuries. Reports that her BP gets higher in the afternoon and evenings. SBP <180. Going to cardiology and they are aware. Also reports they are monitoring for A.fib.    11/13/2021 Mia [name] is a 71 year old female with a history of memory disturbance, tremor and fibromyalgia.  She returns today for follow-up.  Tremor: Reports it is stable.  Continues on primidone 50 mg at bedtime.  Husband reports that he has not noticed a tremor in the neck like he used to.  Fibromyalgia: Stable on Effexor.  Memory disturbance: Reports that she sometimes has trouble remembering dates has to use a calendar.  Did not remember a wedding that she was in 18 years ago.  She lives at home with her husband.  Able to complete all ADLs independently.  She manages her own medications and appointments.  Denies any trouble sleeping.  Reports good appetite.  Does report that she will have back surgery January 31 for ruptured  disc.  03/04/21: Mia [name] is a 71 year old female with a history of memory disturbance, tremor and fibromyalgia.  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  The patient reports today that years ago when she was at [place] this was also mentioned as a diagnosis.  She has had normal nerve conduction with EMGs in the past.  In regards to her tremor she feels that Mysoline continues to work fairly well for her.    Fibromyalgia: Continues on Effexor feels that this works well for her.  In regards to her memory she feels that it has gotten worse since having Covid in January.  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  She is able to complete all ADLs independently.  She has noticed some change in her balance.  States that she recently had a fall and injured her elbow. HISTORY:  Feels that her tremor has been well controlled with Mysoline.  Primarily just affects the hands  Reports that fibromyalgia continues to be fairly controlled with Effexor.  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  She returns today for an evaluation.  HISTORY Mia [name] is a 71 year old right-handed white female with a history of a mild memory disturbance, she is on Aricept and Namenda.  She contracted  the Covid virus and required hospitalization on 06 November 2019.  The patient has had some increase in fatigue and some increased cognitive slowing since that time.  She has had ongoing pain in the neck and low back, she is followed through an orthopedic surgeon.  She had a recent MRI of the cervical and lumbar spine done through [facility] in [place].  The results of the studies are not available to me.  The patient does report some gait instability, she has not had any recent falls.  She does not use a cane.  She returns the  office today for an evaluation.  She is on very low-dose Mysoline taking 50 mg at night.  REVIEW OF SYSTEMS: Out of a complete 14 system review of symptoms, the patient complains only of the following symptoms, and all other reviewed systems are negative.  See HPI  ALLERGIES: Allergies  Allergen Reactions   Codeine Nausea And Vomiting   Myrbetriq  [Mirabegron] Other (See Comments)    SEVERE HEADACHE   Nystatin    Percocet [Oxycodone-Acetaminophen] Nausea And Vomiting   Propoxyphene Nausea And Vomiting   Oxycodone-Acetaminophen Nausea Only   Celebrex [Celecoxib] Other (See Comments)    Other reaction(s): Other flushed   Darvocet [Propoxyphene N-Acetaminophen] Nausea And Vomiting   Erythromycin Nausea And Vomiting    Nausea and vomiting    Hydrocodone Nausea And Vomiting   Lyrica [Pregabalin] Swelling    Legs swelling    Macrodantin [Nitrofurantoin] Nausea And Vomiting   Toradol [Ketorolac Tromethamine] Nausea And Vomiting    Per patient, only PO form causes nausea and vomiting. Can take injection without issue   Tramadol Nausea And Vomiting   Verapamil Nausea And Vomiting    REACTION: intolerance    HOME MEDICATIONS: Outpatient Medications Prior to Visit  Medication Sig Dispense Refill   albuterol (VENTOLIN HFA) 108 (90 Base) MCG/ACT inhaler Inhale 2 puffs into the lungs every 4 (four) hours.     amitriptyline (ELAVIL) 10 MG tablet Take by mouth.     amoxicillin (AMOXIL) 500 MG capsule Before dental procedures     colestipol (COLESTID) 1 g tablet Take 1 g by mouth 2 (two) times daily.     Cyanocobalamin (VITAMIN B-12 IJ) Inject 1 mL as directed every 30 (thirty) days.      donepezil (ARICEPT) 10 MG tablet TAKE ONE TABLET BY MOUTH EVERY NIGHT AT BEDTIME 90 tablet 3   gabapentin (NEURONTIN) 300 MG capsule TAKE 1 TO 2 CAPSULES BY MOUTH UP TO TWO TIMES DAILY AS NEEDED (Patient taking differently: Take 300 mg by mouth 2 (two) times daily.) 180 capsule 3   ibuprofen (ADVIL) 600  MG tablet ibuprofen 600 mg tablet  TAKE 1 TABLET BY MOUTH EVERY 6 HOURS AS NEEDED     ipratropium (ATROVENT) 0.03 % nasal spray Place 2 sprays into both nostrils every 12 (twelve) hours. 30 mL 1   leflunomide (ARAVA) 20 MG tablet Take 1 tablet (20 mg total) by mouth daily. 90 tablet 0   Loratadine (CLARITIN PO) Take by mouth.     losartan (COZAAR) 50 MG tablet Take 1 tablet (50 mg total) by mouth daily. 30 tablet 0   Melatonin 10 MG CAPS Take 10 capsules by mouth daily.     memantine (NAMENDA) 10 MG tablet Take 1 tablet (10 mg total) by mouth 2 (two) times daily. 180 tablet 4   metoprolol tartrate (LOPRESSOR) 100 MG tablet TAKE 1 AND 1/2 TABLETS TWICE DAILY 45 tablet 0   ondansetron (  ZOFRAN) 4 MG tablet Take by mouth.     ondansetron (ZOFRAN-ODT) 8 MG disintegrating tablet Take 1 tablet by mouth as needed for nausea.     pantoprazole (PROTONIX) 40 MG tablet Take 40 mg by mouth 2 (two) times daily.     polyethylene glycol powder (GLYCOLAX/MIRALAX) 17 GM/SCOOP powder Take 17 g by mouth daily as needed for mild constipation or moderate constipation.      primidone (MYSOLINE) 50 MG tablet TAKE 1 TABLET AT BEDTIME 90 tablet 1   venlafaxine XR (EFFEXOR-XR) 75 MG 24 hr capsule TAKE ONE CAPSULE BY MOUTH DAILY WITH BREAFAST 90 capsule 3   zoledronic acid (RECLAST) 5 MG/100ML SOLN injection      No facility-administered medications prior to visit.    PAST MEDICAL HISTORY: Past Medical History:  Diagnosis Date   Anal fissure    Anemia    Arthritis    Blood transfusion    C. difficile colitis    Chest pain    a. 01/2013 MV: EF 59%, no ischemia.   Chronic Dyspnea    a. 01/2013 Echo: EF 60-65%, Gr 1 DD, PASP 84mHg.// Echo 01/2020: EF 60-65, no RWMA, GR 1 DD, GLS -21.6%, normal RV SF, trivial MR    COVID-19    DDD (degenerative disc disease), cervical    DDD (degenerative disc disease), lumbar    Diabetes mellitus    Fibromyalgia    Gall stones    GERD (gastroesophageal reflux disease)     gastritis   Hemorrhoids    Hypertension    Interstitial cystitis    MCI (mild cognitive impairment) 11/25/2017   Memory difficulty 12/14/2013   Neuromuscular disorder (HCC)    sclerosis   Osteoporosis    Peptic ulcer    PONV (postoperative nausea and vomiting)    Pseudogout    Raynaud phenomenon    Rectal bleeding    Sleep apnea    a. on cpap.   [place]) [place]    Syncope 09/10/2015   Thyroid disease    hypothyroidism   Tremor, essential 05/14/2016    PAST SURGICAL HISTORY: Past Surgical History:  Procedure Laterality Date   APPENDECTOMY     BLADDER SURGERY     x2   BLADDER SURGERY     BREAST BIOPSY     CARDIAC CATHETERIZATION     CARPAL TUNNEL RELEASE     CATARACT EXTRACTION Bilateral    CHOLECYSTECTOMY     DILATION AND CURETTAGE OF UTERUS     ENTEROCELE REPAIR     x2   EYE SURGERY     retina   herniated disc  11/25/2021   L4 and L5   hysterectomy - unknown type     JOINT REPLACEMENT     KNEE ARTHROPLASTY     KNEE SURGERY     x6   NECK SURGERY     fusion   OOPHORECTOMY     QUADRICEPS REPAIR Right    RECTOCELE REPAIR     x2   SHOULDER ARTHROSCOPY WITH SUBACROMIAL DECOMPRESSION, ROTATOR CUFF REPAIR AND BICEP TENDON REPAIR  10/06/2012   Procedure: SHOULDER ARTHROSCOPY WITH SUBACROMIAL DECOMPRESSION, ROTATOR CUFF REPAIR AND BICEP TENDON REPAIR;  Surgeon: JNita Sells MD;  [facility]  Service: Orthopedics;  Laterality: Right;  Arthroscopic  Repair  of  Subscapularis, Open Biceps Tenodesis   SHOULDER SURGERY     bilateral- bones spur   SHOULDER SURGERY Left 04/15/2021   TONSILLECTOMY     TOTAL  KNEE ARTHROPLASTY     TOTAL SHOULDER ARTHROPLASTY      FAMILY HISTORY: Family History  Problem Relation Age of Onset   Heart attack Mother    Stroke Mother    Diabetes Mother    Heart disease Mother    Colon polyps Mother    Asthma Mother    Dementia Mother    Heart disease Father    Heart attack  Father    Asthma Father    Stroke Sister    Hypertension Sister    Rheum arthritis Sister    Dementia Sister    Asthma Sister    Lupus Sister    Asthma Sister    Breast cancer Maternal Grandmother    Wilson's disease Maternal Grandmother    Pancreatic cancer Maternal Grandfather     SOCIAL HISTORY: Social History   Socioeconomic History   Marital status: Married    Spouse name: Jenny Reichmann "[name]"   Number of children: 2   Years of education: 14   Highest education level: Associate degree: academic program  Occupational History   Occupation: Retired  Tobacco Use   Smoking status: Never   Smokeless tobacco: Never  Vaping Use   Vaping Use: Never used  Substance and Sexual Activity   Alcohol use: No    Alcohol/week: 0.0 standard drinks of alcohol   Drug use: No   Sexual activity: Not on file  Other Topics Concern   Not on file  Social History Narrative   Lives with her husband. She likes to hang out with her friends and play games and do bible study.   Social Determinants of Health   Financial Resource Strain: Low Risk  (05/05/2022)   Overall Financial Resource Strain (CARDIA)    Difficulty of Paying Living Expenses: Not hard at all  Food Insecurity: No Food Insecurity (05/05/2022)   Hunger Vital Sign    Worried About Running Out of Food in the Last Year: Never true    Ran Out of Food in the Last Year: Never true  Transportation Needs: No Transportation Needs (05/05/2022)   PRAPARE - Hydrologist (Medical): No    Lack of Transportation (Non-Medical): No  Physical Activity: Inactive (05/05/2022)   Exercise Vital Sign    Days of Exercise per Week: 0 days    Minutes of Exercise per Session: 0 min  Stress: No Stress Concern Present (05/05/2022)   [place]    Feeling of Stress : Not at all  Social Connections: [place] (05/05/2022)   Social Connection and Isolation Panel  [NHANES]    Frequency of Communication with Friends and Family: More than three times a week    Frequency of Social Gatherings with Friends and Family: More than three times a week    Attends Religious Services: More than 4 times per year    Active Member of [employer] or Organizations: Yes    Attends Music therapist: More than 4 times per year    Marital Status: Married  Human resources officer Violence: Not At Risk (05/05/2022)   Humiliation, Afraid, Rape, and Kick questionnaire    Fear of Current or Ex-Partner: No    Emotionally Abused: No    Physically Abused: No    Sexually Abused: No      PHYSICAL EXAM  There were no vitals filed for this visit.  There is no height or weight on file to calculate BMI.  11/13/2021    2:01 PM 03/04/2021   10:26 AM 08/28/2020   10:34 AM  MMSE - Mini Mental State Exam  Orientation to time '5 2 3  '$ Orientation to Place '5 5 5  '$ Registration '3 3 3  '$ Attention/ Calculation '4 5 1  '$ Recall '2 2 3  '$ Language- name 2 objects '2 2 2  '$ Language- repeat '1 1 1  '$ Language- follow 3 step command '3 3 3  '$ Language- read & follow direction '1 1 1  '$ Write a sentence '1 1 1  '$ Copy design '1 1 1  '$ Total score '28 26 24     '$ Generalized: Well developed, in no acute distress   Neurological examination  Mentation: Alert oriented to time, place, history taking. Follows all commands speech and language fluent Cranial nerve II-XII: Pupils were equal round reactive to light. Extraocular movements were full, visual field were full on confrontational test. Facial sensation and strength were normal. Uvula tongue midline. Head turning and shoulder shrug  were normal and symmetric. Motor: The motor testing reveals 5 over 5 strength.  Patient has giveaway weakness noted in the left upper extremity.  Good symmetric motor tone is noted throughout.  Sensory: Sensory testing is intact to soft touch on all 4 extremities. No evidence of extinction is noted.  Coordination: Cerebellar  testing reveals good finger-nose-finger and heel-to-shin bilaterally.  Gait and station: Gait is unsteady. Uses a cane. Tandem gait no attempted.    DIAGNOSTIC DATA (LABS, IMAGING, TESTING) - I reviewed patient records, labs, notes, testing and imaging myself where available.  Lab Results  Component Value Date   WBC 6.4 03/24/2022   HGB 12.5 03/24/2022   HCT 39.2 03/24/2022   MCV 92.2 03/24/2022   PLT 359 03/24/2022      Component Value Date/Time   NA 131 (L) 03/24/2022 1038   NA 138 01/23/2020 1602   K 4.8 03/24/2022 1038   CL 96 (L) 03/24/2022 1038   CO2 24 03/24/2022 1038   GLUCOSE 83 03/24/2022 1038   BUN 9 03/24/2022 1038   BUN 9 01/23/2020 1602   CREATININE 0.65 03/24/2022 1038   CALCIUM 9.8 03/24/2022 1038   PROT 6.5 03/24/2022 1038   PROT 5.9 (L) 06/07/2019 0934   ALBUMIN 3.8 12/15/2021 1142   ALBUMIN 3.9 06/07/2019 0934   AST 28 03/24/2022 1038   ALT 25 03/24/2022 1038   ALKPHOS 73 05/21/2020 0819   BILITOT 0.3 03/24/2022 1038   BILITOT 0.3 06/07/2019 0934   GFRNONAA 81 03/10/2021 1008   GFRAA 94 03/10/2021 1008   Lab Results  Component Value Date   CHOL 215 (H) 08/01/2021   HDL 68 08/01/2021   LDLCALC 123 (H) 08/01/2021   TRIG 125 08/01/2021   CHOLHDL 3.2 08/01/2021   Lab Results  Component Value Date   HGBA1C 5.5 02/19/2022   Lab Results  Component Value Date   VITAMINB12 553 01/15/2022   Lab Results  Component Value Date   TSH 0.97 12/15/2021      ASSESSMENT AND PLAN 71 y.o. year old female  has a past medical history of Anal fissure, Anemia, Arthritis, Blood transfusion, C. difficile colitis, Chest pain, Chronic Dyspnea, COVID-19, DDD (degenerative disc disease), cervical, DDD (degenerative disc disease), lumbar, Diabetes mellitus, Fibromyalgia, Gall stones, GERD (gastroesophageal reflux disease), Hemorrhoids, Hypertension, Interstitial cystitis, MCI (mild cognitive impairment) (11/25/2017), Memory difficulty (12/14/2013), Neuromuscular  disorder ([place]), Osteoporosis, Peptic ulcer, PONV (postoperative nausea and vomiting), Pseudogout, Raynaud phenomenon, Rectal bleeding, Sleep apnea, SVT (supraventricular tachycardia) ([place]), Syncope (  09/10/2015), Thyroid disease, and Tremor, essential (05/14/2016). here with :  1. Tremor  - Stable - Continue Mysoline 50 mg daily at bedtime  2.  Fibromyalgia   -Continue Effexor 75 mg daily  3.  Memory disturbance  -Continue Namenda 10 mg twice a day -Continue Aricept 10 mg at bedtime  4. Gait abnormality  - referral to PT   FU in 6 months or sooner if needed  Gannett Givens, MSN, NP-C 05/14/2022, 9:40 AM [facility] [address] [place], [place] [address] [phone]

## 2022-05-14 NOTE — Telephone Encounter (Signed)
Referral for Physical Therapy sent to [facility] [phone]. 

## 2022-05-18 ENCOUNTER — Encounter: Payer: Self-pay | Admitting: Internal Medicine

## 2022-05-18 MED ORDER — VALSARTAN 160 MG PO TABS: 160.0000 mg | ORAL_TABLET | Freq: Two times a day (BID) | ORAL | 3 refills | Status: DC

## 2022-05-18 NOTE — Telephone Encounter (Signed)
Spoke with pt and advised per Dr Acie Fredrickson to stop Losartan and begin Valsartan '160mg'$  - 1 tablet by mouth twice daily.  Continue to monitor BP 1-2 hours after taking medication, get some regular exercise and watch salt intake with things like processed meats, canned foods and fast food.  Pt verbalizes understanding and agrees with current plan.  Rx sent to pharmacy requested and Losartan removed from pt's medication list.      Nahser, Wonda Cheng, MD  P Cv Div Ch St Triage Lets try Valsartan 160 mg BID instead of the Losartan  This should work better .   Please remind her that the proper time to take her BP is 1 hr after taking her meds.   Continue regular exercise, watch salt, ( processed meats, take out food, canned foods)   PN

## 2022-05-19 ENCOUNTER — Ambulatory Visit (INDEPENDENT_AMBULATORY_CARE_PROVIDER_SITE_OTHER): Payer: Medicare Other | Admitting: Rheumatology

## 2022-05-19 ENCOUNTER — Encounter: Payer: Self-pay | Admitting: Rheumatology

## 2022-05-19 VITALS — BP 135/81 | HR 75 | Ht 66.0 in | Wt 178.8 lb

## 2022-05-19 DIAGNOSIS — M5030 Other cervical disc degeneration, unspecified cervical region: Secondary | ICD-10-CM

## 2022-05-19 DIAGNOSIS — M7712 Lateral epicondylitis, left elbow: Secondary | ICD-10-CM

## 2022-05-19 DIAGNOSIS — Z8781 Personal history of (healed) traumatic fracture: Secondary | ICD-10-CM

## 2022-05-19 DIAGNOSIS — Z96651 Presence of right artificial knee joint: Secondary | ICD-10-CM

## 2022-05-19 DIAGNOSIS — Z79899 Other long term (current) drug therapy: Secondary | ICD-10-CM

## 2022-05-19 DIAGNOSIS — M797 Fibromyalgia: Secondary | ICD-10-CM

## 2022-05-19 DIAGNOSIS — R5382 Chronic fatigue, unspecified: Secondary | ICD-10-CM

## 2022-05-19 DIAGNOSIS — I7300 Raynaud's syndrome without gangrene: Secondary | ICD-10-CM | POA: Diagnosis not present

## 2022-05-19 DIAGNOSIS — N3010 Interstitial cystitis (chronic) without hematuria: Secondary | ICD-10-CM

## 2022-05-19 DIAGNOSIS — Z8719 Personal history of other diseases of the digestive system: Secondary | ICD-10-CM

## 2022-05-19 DIAGNOSIS — M5134 Other intervertebral disc degeneration, thoracic region: Secondary | ICD-10-CM

## 2022-05-19 DIAGNOSIS — M818 Other osteoporosis without current pathological fracture: Secondary | ICD-10-CM

## 2022-05-19 DIAGNOSIS — M0600 Rheumatoid arthritis without rheumatoid factor, unspecified site: Secondary | ICD-10-CM

## 2022-05-19 DIAGNOSIS — M7062 Trochanteric bursitis, left hip: Secondary | ICD-10-CM

## 2022-05-19 DIAGNOSIS — Z8679 Personal history of other diseases of the circulatory system: Secondary | ICD-10-CM

## 2022-05-19 DIAGNOSIS — M1712 Unilateral primary osteoarthritis, left knee: Secondary | ICD-10-CM

## 2022-05-19 DIAGNOSIS — M1120 Other chondrocalcinosis, unspecified site: Secondary | ICD-10-CM

## 2022-05-19 DIAGNOSIS — Z9889 Other specified postprocedural states: Secondary | ICD-10-CM

## 2022-05-19 DIAGNOSIS — Z8619 Personal history of other infectious and parasitic diseases: Secondary | ICD-10-CM

## 2022-05-19 DIAGNOSIS — M5136 Other intervertebral disc degeneration, lumbar region: Secondary | ICD-10-CM

## 2022-05-19 NOTE — Patient Instructions (Addendum)
Standing Labs We placed an order today for your standing lab work.   Please have your standing labs drawn in August and every 3 months  If possible, please have your labs drawn 2 weeks prior to your appointment so that the provider can discuss your results at your appointment.  Please note that you may see your imaging and lab results in [place] before we have reviewed them. We may be awaiting multiple results to interpret others before contacting you. Please allow our office up to 72 hours to thoroughly review all of the results before contacting the office for clarification of your results.  We have open lab daily: Monday through Thursday from 1:30-4:30 PM and Friday from 1:30-4:00 PM at the office of Dr. Bo Merino, [facility].   Please be advised, all patients with office appointments requiring lab work will take precedent over walk-in lab work.  If possible, please come for your lab work on Monday and Friday afternoons, as you may experience shorter wait times. The office is located at [address] [place], [place], [place] [address] No appointment is necessary.   Labs are drawn by [facility]. Please bring your co-pay at the time of your lab draw.  You may receive a bill from [facility] for your lab work.  Please note if you are on Hydroxychloroquine and and an order has been placed for a Hydroxychloroquine level, you will need to have it drawn 4 hours or more after your last dose.  If you wish to have your labs drawn at another location, please call the office 24 hours in advance to send orders.  If you have any questions regarding directions or hours of operation,  please call [phone].   As a reminder, please drink plenty of water prior to coming for your lab work. Thanks!   Vaccines You are taking a medication(s) that can suppress your immune system.  The following immunizations are recommended: Flu annually Covid-19  Td/Tdap (tetanus, diphtheria,  pertussis) every 10 years Pneumonia (Prevnar 15 then Pneumovax 23 at least 1 year apart.  Alternatively, can take Prevnar 20 without needing additional dose) Shingrix: 2 doses from 4 weeks to 6 months apart  Please check with your PCP to make sure you are up to date.   If you have signs or symptoms of an infection or start antibiotics: First, call your PCP for workup of your infection. Hold your medication through the infection, until you complete your antibiotics, and until symptoms resolve if you take the following: Injectable medication (Actemra, Benlysta, Cimzia, Cosentyx, Enbrel, Humira, Kevzara, Orencia, Remicade, Simponi, Stelara, Taltz, Tremfya) Methotrexate Leflunomide (Arava) Mycophenolate (Cellcept) Roma Kayser, or Rinvoq  Iliotibial Band Syndrome Rehab Ask your health care provider which exercises are safe for you. Do exercises exactly as told by your health care provider and adjust them as directed. It is normal to feel mild stretching, pulling, tightness, or discomfort as you do these exercises. Stop right away if you feel sudden pain or your pain gets significantly worse. Do not begin these exercises until told by your health care provider. Stretching and range-of-motion exercises These exercises warm up your muscles and joints and improve the movement and flexibility of your hip and pelvis. Quadriceps stretch, prone  Lie on your abdomen (prone position) on a firm surface, such as a bed or padded floor. Bend your left / right knee and reach back to hold your ankle or pant leg. If you cannot reach your ankle or pant leg, loop a  belt around your foot and grab the belt instead. Gently pull your heel toward your buttocks. Your knee should not slide out to the side. You should feel a stretch in the front of your thigh and knee (quadriceps). Hold this position for __________ seconds. Repeat __________ times. Complete this exercise __________ times a day. Iliotibial band  stretch An iliotibial band is a strong band of muscle tissue that runs from the outer side of your hip to the outer side of your thigh and knee. Lie on your side with your left / right leg in the top position. Bend both of your knees and grab your left / right ankle. Stretch out your bottom arm to help you balance. Slowly bring your top knee back so your thigh goes behind your trunk. Slowly lower your top leg toward the floor until you feel a gentle stretch on the outside of your left / right hip and thigh. If you do not feel a stretch and your knee will not fall farther, place the heel of your other foot on top of your knee and pull your knee down toward the floor with your foot. Hold this position for __________ seconds. Repeat __________ times. Complete this exercise __________ times a day. Strengthening exercises These exercises build strength and endurance in your hip and pelvis. Endurance is the ability to use your muscles for a long time, even after they get tired. Straight leg raises, side-lying This exercise strengthens the muscles that rotate the leg at the hip and move it away from your body (hip abductors). Lie on your side with your left / right leg in the top position. Lie so your head, shoulder, hip, and knee line up. You may bend your bottom knee to help you balance. Roll your hips slightly forward so your hips are stacked directly over each other and your left / right knee is facing forward. Tense the muscles in your outer thigh and lift your top leg 4-6 inches (10-15 cm). Hold this position for __________ seconds. Slowly lower your leg to return to the starting position. Let your muscles relax completely before doing another repetition. Repeat __________ times. Complete this exercise __________ times a day. Leg raises, prone This exercise strengthens the muscles that move the hips backward (hip extensors). Lie on your abdomen (prone position) on your bed or a firm surface. You  can put a pillow under your hips if that is more comfortable for your lower back. Bend your left / right knee so your foot is straight up in the air. Squeeze your buttocks muscles and lift your left / right thigh off the bed. Do not let your back arch. Tense your thigh muscle as hard as you can without increasing any knee pain. Hold this position for __________ seconds. Slowly lower your leg to return to the starting position and allow it to relax completely. Repeat __________ times. Complete this exercise __________ times a day. Hip hike Stand sideways on a bottom step. Stand on your left / right leg with your other foot unsupported next to the step. You can hold on to a railing or wall for balance if needed. Keep your knees straight and your torso square. Then lift your left / right hip up toward the ceiling. Slowly let your left / right hip lower toward the floor, past the starting position. Your foot should get closer to the floor. Do not lean or bend your knees. Repeat __________ times. Complete this exercise __________ times a day. This  information is not intended to replace advice given to you by your health care provider. Make sure you discuss any questions you have with your health care provider. Document Revised: 12/20/2019 Document Reviewed: 12/20/2019 Elsevier Patient Education  [employer].

## 2022-05-20 ENCOUNTER — Ambulatory Visit (INDEPENDENT_AMBULATORY_CARE_PROVIDER_SITE_OTHER): Payer: Medicare Other | Admitting: Family Medicine

## 2022-05-20 VITALS — BP 139/73 | HR 62

## 2022-05-20 DIAGNOSIS — E538 Deficiency of other specified B group vitamins: Secondary | ICD-10-CM | POA: Diagnosis not present

## 2022-05-20 MED ORDER — CYANOCOBALAMIN 1000 MCG/ML IJ SOLN
1000.0000 ug | Freq: Once | INTRAMUSCULAR | Status: AC
  Administered 2022-05-20: 1000 ug via INTRAMUSCULAR

## 2022-05-20 NOTE — Progress Notes (Signed)
Pt came in today for B12 injection. Injection given in RUOQ she tolerated well.  Pt reports no negative side effects from medication. Denies any dizziness, chest pain or palpitations, and no GI problems.  Pt will RTC in 4 weeks for next injection

## 2022-05-20 NOTE — Progress Notes (Signed)
Agree with documentation as above.   Ladean Steinmeyer, MD  

## 2022-05-21 ENCOUNTER — Encounter: Payer: MEDICARE | Attending: Radiation Oncology | Primary: Internal Medicine

## 2022-05-21 DIAGNOSIS — C50512 Malignant neoplasm of lower-outer quadrant of left female breast: Secondary | ICD-10-CM

## 2022-05-25 ENCOUNTER — Encounter: Payer: Self-pay | Admitting: Physical Therapy

## 2022-05-25 ENCOUNTER — Ambulatory Visit: Payer: Medicare Other | Attending: Adult Health | Admitting: Physical Therapy

## 2022-05-25 DIAGNOSIS — R262 Difficulty in walking, not elsewhere classified: Secondary | ICD-10-CM

## 2022-05-25 DIAGNOSIS — R269 Unspecified abnormalities of gait and mobility: Secondary | ICD-10-CM | POA: Diagnosis not present

## 2022-05-25 DIAGNOSIS — R296 Repeated falls: Secondary | ICD-10-CM | POA: Insufficient documentation

## 2022-05-25 DIAGNOSIS — M79605 Pain in left leg: Secondary | ICD-10-CM | POA: Diagnosis not present

## 2022-05-25 DIAGNOSIS — R2681 Unsteadiness on feet: Secondary | ICD-10-CM | POA: Diagnosis not present

## 2022-05-25 DIAGNOSIS — M6281 Muscle weakness (generalized): Secondary | ICD-10-CM | POA: Insufficient documentation

## 2022-05-25 DIAGNOSIS — M25562 Pain in left knee: Secondary | ICD-10-CM | POA: Diagnosis not present

## 2022-05-25 DIAGNOSIS — M25552 Pain in left hip: Secondary | ICD-10-CM | POA: Diagnosis not present

## 2022-05-25 NOTE — Therapy (Signed)
OUTPATIENT PHYSICAL THERAPY NEURO EVALUATION   Patient Name: Mia [name] MRN: [id number] DOB:May 23, 1951, 71 y.o., female Today's Date: 05/25/2022   PCP: Mia [name]  REFERRING PROVIDER: Tienda Givens, NP    PT End of Session - 05/25/22 1118     Visit Number 1    Number of Visits 25    Date for PT Re-Evaluation 08/17/22    Authorization Type MCR/BCBS    Authorization Time Period 05/25/22 to 08/17/22    Progress Note Due on Visit 10    PT Start Time 1017    PT Stop Time 1057    PT Time Calculation (min) 40 min    Activity Tolerance Patient limited by pain    Behavior During Therapy [facility] for tasks assessed/performed             Past Medical History:  Diagnosis Date   Anal fissure    Anemia    Arthritis    Blood transfusion    C. difficile colitis    Chest pain    a. 01/2013 MV: EF 59%, no ischemia.   Chronic Dyspnea    a. 01/2013 Echo: EF 60-65%, Gr 1 DD, PASP 74mHg.// Echo 01/2020: EF 60-65, no RWMA, GR 1 DD, GLS -21.6%, normal RV SF, trivial MR    COVID-19    DDD (degenerative disc disease), cervical    DDD (degenerative disc disease), lumbar    Diabetes mellitus    Fibromyalgia    Gall stones    GERD (gastroesophageal reflux disease)    gastritis   Hemorrhoids    Hypertension    Interstitial cystitis    MCI (mild cognitive impairment) 11/25/2017   Memory difficulty 12/14/2013   Neuromuscular disorder (HCC)    sclerosis   Osteoporosis    Peptic ulcer    PONV (postoperative nausea and vomiting)    Pseudogout    Raynaud phenomenon    Rectal bleeding    Sleep apnea    a. on cpap.   SVT (supraventricular tachycardia) ([place]    Syncope 09/10/2015   Thyroid disease    hypothyroidism   Tremor, essential 05/14/2016   Past Surgical History:  Procedure Laterality Date   APPENDECTOMY     BLADDER SURGERY     x2   BLADDER SURGERY     BREAST BIOPSY     CARDIAC CATHETERIZATION     CARPAL TUNNEL RELEASE     CATARACT EXTRACTION Bilateral     CHOLECYSTECTOMY     DILATION AND CURETTAGE OF UTERUS     ENTEROCELE REPAIR     x2   EYE SURGERY     retina   herniated disc  11/25/2021   L4 and L5   hysterectomy - unknown type     JOINT REPLACEMENT     KNEE ARTHROPLASTY     KNEE SURGERY     x6   NECK SURGERY     fusion   OOPHORECTOMY     QUADRICEPS REPAIR Right    RECTOCELE REPAIR     x2   SHOULDER ARTHROSCOPY WITH SUBACROMIAL DECOMPRESSION, ROTATOR CUFF REPAIR AND BICEP TENDON REPAIR  10/06/2012   Procedure: SHOULDER ARTHROSCOPY WITH SUBACROMIAL DECOMPRESSION, ROTATOR CUFF REPAIR AND BICEP TENDON REPAIR;  Surgeon: JNita Sells MD;  [facility]  Service: Orthopedics;  Laterality: Right;  Arthroscopic  Repair  of  Subscapularis, Open Biceps Tenodesis   SHOULDER SURGERY     bilateral- bones spur   SHOULDER SURGERY Left 04/15/2021   TONSILLECTOMY  TOTAL KNEE ARTHROPLASTY     TOTAL SHOULDER ARTHROPLASTY     Patient Active Problem List   Diagnosis Date Noted   Pernicious anemia 09/11/2020   Cyst of skin and subcutaneous tissue 09/11/2020   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   Weakness of both lower extremities 06/11/2020   Hyponatremia 05/22/2020   Gait instability 06/20/2019   Facet arthritis of lumbar region 05/10/2018   MCI (mild cognitive impairment) 11/25/2017   Restrictive lung disease 07/30/2017   Mixed hyperlipidemia 06/18/2017   Rectocele 04/26/2017   Irritable bowel syndrome with both constipation and diarrhea 04/26/2017   Osteoporosis 04/18/2017   Trochanteric bursitis of both hips 02/24/2017   Stress fracture of left tibia 11/05/2016   Inflammatory arthritis 09/30/2016   High risk medication use 09/30/2016   History of Clostridium difficile colitis 09/30/2016   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   Pseudogout 09/30/2016   DDD cervical spine status post fusion 09/30/2016   DDD thoracic spine 09/30/2016   H/O total knee replacement, right 09/30/2016   Age-related  osteoporosis without current pathological fracture 09/30/2016   Tremor, essential 05/14/2016   Right foot pain 04/14/2016   B12 deficiency 09/10/2015   Syncope 09/10/2015   Cervical facet joint syndrome 09/10/2015   Chronic fatigue 09/10/2015   Aortic atherosclerosis ([place]) 04/01/2015   DOE (dyspnea on exertion)    Primary osteoarthritis of right knee 08/13/2014   Benign head tremor 06/11/2014   Lumbar degenerative disc disease 06/11/2014   IFG (impaired fasting glucose) 03/09/2014   Hemorrhoid 03/09/2014   Retinal wrinkling, right eye 03/09/2014   Fibromyalgia 03/09/2014   GERD (gastroesophageal reflux disease) 03/09/2014   History of arthroplasty of right knee 01/31/2014   Memory difficulty 12/14/2013   Hemorrhoids, external, thrombosed 06/22/2011   Hypertension 03/11/2011   SVT (supraventricular tachycardia) ([employer])    Raynaud phenomenon    EDEMA 08/25/2010   Nonspecific (abnormal) findings on radiological and other examination of body structure 08/25/2010   COMPUTERIZED TOMOGRAPHY, CHEST, ABNORMAL 08/25/2010   OSA (obstructive sleep apnea) 04/24/2009   Allergic rhinitis 06/11/2008   Dyspnea 06/11/2008   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   Chronic diastolic CHF (congestive heart failure) ([place]) 06/08/2008   INTERSTITIAL CYSTITIS 06/08/2008    ONSET DATE: 05/14/2022   REFERRING DIAG: R26.9 (ICD-10-CM) - Abnormality of gait and mobility W19.XXXD (ICD-10-CM) - Fall, subsequent encounter   THERAPY DIAG:  Muscle weakness (generalized)  Unsteadiness on feet  Pain in left leg  Difficulty in walking, not elsewhere classified  Repeated falls  Rationale for Evaluation and Treatment Rehabilitation  SUBJECTIVE:  SUBJECTIVE STATEMENT: I've been having a lot of falls, I lose  my balance. If I get anywhere off balance at all, I tend to fall, I can't right myself. Its been a problem for awhile. I have RA too and I've had some back surgeries. 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 BP tends to get high the highest its been has been 209/110, MD changed some meds  Pt accompanied by: significant other  PERTINENT HISTORY: [name] is a 71 year old female with a history of tremor, fibromyalgia and memory disturbance. She returns today for follow-up.    Tremor: Currently taking Primidone 50 mg at bedtime. Seems to be working well. Termor well controlled.    Fibromyalgia: Currently on Effexor 75 mg daily. Has a lot of tenderness but feels effexor is helpful    Memory: Currently on Aricept 10 mg at bedtime and namenda 10 mg BID. Feels that her memory is worse. Has trouble with short term things. Reports that she forgets what people has told her. Able to complete all ADLs independently. She does write notes in order to remember. May forget to take clothes out of the washer.    Reports that she is having trouble with her balance. Has had several falls. Fortunately no significant injuries. Reports that her BP gets higher in the afternoon and evenings. SBP <180. Going to cardiology and they are aware. Also reports they are monitoring for A.fib.   PAIN:  Are you having pain? Yes: NPRS scale: 2/10; when walking pain can get to 7-8/10 Pain location: L hip and down the back of L LE  Pain description: sometimes more of a sharp ache  Aggravating factors: walking too much, bending over Relieving factors: rest   PRECAUTIONS: Fall and Other: hx elevated BP   WEIGHT BEARING RESTRICTIONS No  FALLS: Has patient fallen in last 6 months? Yes. Number of falls 4  LIVING ENVIRONMENT: Lives with: lives with their spouse Lives in: House/apartment Stairs: No Has following equipment at home: Single point cane, Environmental consultant - 2 wheeled,  Crutches, Grab bars, Ramped entry, and raised toilet, hybrid bed due to fibromyalgia    PLOF: Independent, Independent with basic ADLs, Independent with gait, and Independent with transfers  PATIENT GOALS  "for people to stop telling me I look drunk when I walk", be more active and be able to travel again, no more falls   OBJECTIVE:   DIAGNOSTIC FINDINGS:   COGNITION: Overall cognitive status: Within functional limits for tasks assessed   SENSATION: Not tested  COORDINATION:   PALPATION  L perispinals tight and tender in lumbar region, L lateral thigh tight and tender as well  MUSCLE LENGTH: B hamstrings moderate limited, piriformis groups OK    LOWER EXTREMITY ROM:     Active  Right Eval Left Eval  Hip flexion    Hip extension    Hip abduction    Hip adduction    Hip internal rotation    Hip external rotation    Knee flexion    Knee extension    Ankle dorsiflexion    Ankle plantarflexion    Ankle inversion    Ankle eversion     (Blank rows = not tested)  LOWER EXTREMITY MMT:    MMT Right Eval Left Eval  Hip flexion 3- 2+ pain limited L knee   Hip extension    Hip abduction 3 2 pain limited L knee  Hip adduction  Hip internal rotation    Hip external rotation    Knee flexion 3+ 2 pain limited  L knee   Knee extension 3+ 2 pain limited L knee   Ankle dorsiflexion 5 4+  Ankle plantarflexion    Ankle inversion    Ankle eversion    (Blank rows = not tested)  BED MOBILITY:  Sit to supine Complete Independence Supine to sit Complete Independence Rolling to Right Complete Independence Rolling to Left Complete Independence   GAIT: Gait pattern: step through pattern, decreased step length- Right, decreased stance time- Left, decreased stride length, Left hip hike, knee flexed in stance- Left, antalgic, trendelenburg, decreased trunk rotation, trunk flexed, and narrow BOS, L knee buckled often during gait as she became fatigued  Distance walked:  420f  Assistive device utilized: None Level of assistance: CGA   FUNCTIONAL TESTs:  3 minute walk test: [id number] device one standing rest break  [employer]cale: 24/56  PATIENT SURVEYS:   TODAY'S TREATMENT:     PATIENT EDUCATION: Education details: Education on POC recommendation for imaging L knee, use of RW at all times  Person educated: Patient and Spouse Education method: ExCustomer service managerducation comprehension: verbalized understanding, returned demonstration, and needs further education   HOME EXERCISE PROGRAM: Needs at second session     GOALS: Goals reviewed with patient? No  SHORT TERM GOALS: Target date: 07/06/2022  Will be compliant with appropriate progressive HEP  Baseline: Goal status: INITIAL  2.  Pain to be no more than 5/10 when walking distances >50037fBaseline:  Goal status: INITIAL  3.  Will be compliant with safe use of appropriate LRAD at all times  Baseline:  Goal status: INITIAL  4.  Will be able to name 3 ways to reduce fall risk at home and in the community  Baseline:  Goal status: INITIAL  5.  Will demonstrate improved ability to perform straight line gait without deviation from path with in clinic distances  Baseline:  Goal status: INITIAL   LONG TERM GOALS: Target date: 08/17/2022  MMT to improve by at least 1 grade in all weak groups  Baseline:  Goal status: INITIAL  2.  Will score at least 40/56 on the [place] in order to reduce fall risk  Baseline:  Goal status: INITIAL  3.  Will be able to ambulate at least 600f60f 3MWT without increased fatigue and unsteadiness to improve safe community access  Baseline:  Goal status: INITIAL  4.  Will be compliant with appropriate gym based exercise program to maintain functional gains from therapy  Baseline:  Goal status: INITIAL  5.  Will be able to ambulate outside over grass and mulch with appropriate LRAD and no assist beyond CGA  Baseline:  Goal status:  INITIAL   ASSESSMENT:  CLINICAL IMPRESSION: Patient is a 70 y78. female  who was seen today for physical therapy evaluation and treatment for gait abnormality and falls. Of note she did have a major fall at the pool on Friday, since that time she has been having a lot of L LE pain and increased difficulty with gait; L LE very tender in general (could not tolerate MMT) and reports catching in her knee as well as severe sciatic pain on the left side as well, also gait quickly becomes antalgic due to LLE pain. Advised her to f/u with PCP, given symptoms I do have some concern about possible meniscal tear after this fall. Exam reveals severe deficits in functional strength,  balance, gait pattern, and functional activity tolerance. Interestingly, when she loses her balance she is not able to really recover and requires extended ModA for her balance to "settle" before therapist could reduce level of assist provided to SBA/min guard. I did advise her and her spouse that she must use RW at all times due to extreme fall risk. Will benefit from skilled PT services to address impairments and reduce fall risk moving forward.    OBJECTIVE IMPAIRMENTS Abnormal gait, decreased activity tolerance, decreased balance, decreased coordination, decreased endurance, decreased knowledge of use of DME, decreased mobility, difficulty walking, decreased strength, decreased safety awareness, increased fascial restrictions, improper body mechanics, postural dysfunction, and pain.   ACTIVITY LIMITATIONS standing, squatting, stairs, transfers, continence, bathing, reach over head, and locomotion level  PARTICIPATION LIMITATIONS: meal prep, cleaning, laundry, interpersonal relationship, shopping, community activity, yard work, and church  PERSONAL FACTORS Age, Behavior pattern, Education, Fitness, Past/current experiences, and Time since onset of injury/illness/exacerbation are also affecting patient's functional outcome.   REHAB  POTENTIAL: Good  CLINICAL DECISION MAKING: Evolving/moderate complexity  EVALUATION COMPLEXITY: Moderate  PLAN: PT FREQUENCY: 2x/week  PT DURATION: 12 weeks  PLANNED INTERVENTIONS: Therapeutic exercises, Therapeutic activity, Neuromuscular re-education, Balance training, Gait training, Patient/Family education, Self Care, Joint mobilization, Stair training, DME instructions, Dry Needling, Electrical stimulation, Spinal mobilization, Cryotherapy, Moist heat, Taping, Vasopneumatic device, Ultrasound, Ionotophoresis '4mg'$ /ml Dexamethasone, Manual therapy, and Re-evaluation  PLAN FOR NEXT SESSION: needs HEP. Gait training with RW, strength and balance as tolerated and appropriate. Was she to f/u with MD about imaging for her LE? Consider hamstring stretching and core strength. Try lumbar extensions to try to reduce pain as flexion makes pain worse.    Mikko Lewellen  U PT DPT PN2  05/25/2022, 11:20 AM

## 2022-05-26 MED ORDER — JANUVIA 100 MG PO TABS
100 MG | ORAL_TABLET | ORAL | 3 refills | Status: AC
Start: 2022-05-26 — End: 2023-06-07

## 2022-05-26 MED ORDER — CITALOPRAM HYDROBROMIDE 10 MG PO TABS
10 MG | ORAL_TABLET | ORAL | 3 refills | Status: AC
Start: 2022-05-26 — End: 2023-08-31

## 2022-05-27 DIAGNOSIS — M25562 Pain in left knee: Secondary | ICD-10-CM | POA: Diagnosis not present

## 2022-05-27 DIAGNOSIS — S83282D Other tear of lateral meniscus, current injury, left knee, subsequent encounter: Secondary | ICD-10-CM | POA: Diagnosis not present

## 2022-05-28 ENCOUNTER — Telehealth: Payer: Self-pay | Admitting: *Deleted

## 2022-05-28 ENCOUNTER — Ambulatory Visit: Payer: Medicare Other | Admitting: Physical Therapy

## 2022-05-28 NOTE — Telephone Encounter (Signed)
   Pre-operative Risk Assessment    Patient Name: Mia [name]  DOB: 07-14-1951 MRN: [id number]      Request for Surgical Clearance    Procedure:   LEFT KNEE SCOPE, PLM, CHONDROPLASTY  Date of Surgery:  Clearance TBD                                 Surgeon:  DR. Jonn Shingles Surgeon's Group or Practice Name:  [facility] Phone number:  [phone] Fax number:  [phone]  ATTN: KERRI MAZE   Type of Clearance Requested:   - Medical    Type of Anesthesia:   GENERAL ANESTHESIA WITH KNEE BLOCK   Additional requests/questions:    Astrid Divine   05/28/2022, 1:22 PM

## 2022-05-29 NOTE — Telephone Encounter (Signed)
   Patient Name: Mia [name]  DOB: [birth year] MRN: [id number]  Primary Cardiologist: Mertie Moores, MD  Chart reviewed as part of pre-operative protocol coverage.   Patient is scheduled for follow-up with Dr. Lovena Le on 06/02/2022.  Preoperative clearance can be addressed during this visit.  Patient will be removed from preoperative pool.  Mable Fill, Marissa Nestle, NP 05/29/2022, 11:20 AM

## 2022-06-02 ENCOUNTER — Ambulatory Visit (INDEPENDENT_AMBULATORY_CARE_PROVIDER_SITE_OTHER): Payer: Medicare Other | Admitting: Internal Medicine

## 2022-06-02 ENCOUNTER — Telehealth: Payer: Self-pay | Admitting: *Deleted

## 2022-06-02 ENCOUNTER — Encounter: Payer: Self-pay | Admitting: Internal Medicine

## 2022-06-02 VITALS — BP 146/86 | HR 64 | Ht 66.0 in | Wt 180.4 lb

## 2022-06-02 DIAGNOSIS — R002 Palpitations: Secondary | ICD-10-CM | POA: Insufficient documentation

## 2022-06-02 DIAGNOSIS — I10 Essential (primary) hypertension: Secondary | ICD-10-CM | POA: Diagnosis not present

## 2022-06-02 DIAGNOSIS — I471 Supraventricular tachycardia: Secondary | ICD-10-CM

## 2022-06-02 NOTE — Telephone Encounter (Signed)
Received clearance form for Left Knee Scope, PLM, Chondroplasty by Dr. Jonn Shingles.  Pt last seen 05-14-2022.

## 2022-06-02 NOTE — Patient Instructions (Addendum)
Medication Instructions:  Your physician recommends that you continue on your current medications as directed. Please refer to the Current Medication list given to you today.  *If you need a refill on your cardiac medications before your next appointment, please call your pharmacy*  Check your Blood pressure DAILY.  2.  Continue current Medications, Valsartan and Metoprolol Tartrate AS ORDERED.    Lab Work: None ordered.  If you have labs (blood work) drawn today and your tests are completely normal, you will receive your results only by: [place] (if you have MyChart) OR A paper copy in the mail If you have any lab test that is abnormal or we need to change your treatment, we will call you to review the results.  Testing/Procedures: None ordered.  Follow-Up: In 1 Year with Dr. Lovena Le    We recommend signing up for the patient portal called "MyChart".  Sign up information is provided on this After Visit Summary.  MyChart is used to connect with patients for Virtual Visits (Telemedicine).  Patients are able to view lab/test results, encounter notes, upcoming appointments, etc.  Non-urgent messages can be sent to your provider as well.   To learn more about what you can do with MyChart, go to [email or web address].     Provider:   Cristopher Peru, MD{or one of the following Advanced Practice Providers on your designated Care Team:   Tommye Standard, [place] Legrand Como "Jonni Sanger" Chalmers Cater, [place]   Important Information About Sugar

## 2022-06-02 NOTE — Progress Notes (Signed)
HPI [name] returns today for followup.She is a pleasant 71 yo woman with HTN and SVT who has remained with medical therapy over the years. Her bp has been fairly labile. She has had minutes of palpitations. She has a cardia mobile app and she has had ST in the low 100's. She describes episodes where she feels like her body is shaking. She wore a zio monitor and had no arrhythmias. Her bp was up a couple of weeks ago and she was started on valsartan and her bp has been better.  Allergies  Allergen Reactions   Codeine Nausea And Vomiting   Myrbetriq  [Mirabegron] Other (See Comments)    SEVERE HEADACHE   Nystatin    Percocet [Oxycodone-Acetaminophen] Nausea And Vomiting   Propoxyphene Nausea And Vomiting   Oxycodone-Acetaminophen Nausea Only   Celebrex [Celecoxib] Other (See Comments)    Other reaction(s): Other flushed   Darvocet [Propoxyphene N-Acetaminophen] Nausea And Vomiting   Erythromycin Nausea And Vomiting    Nausea and vomiting    Hydrocodone Nausea And Vomiting   Lyrica [Pregabalin] Swelling    Legs swelling    Macrodantin [Nitrofurantoin] Nausea And Vomiting   Toradol [Ketorolac Tromethamine] Nausea And Vomiting    Per patient, only PO form causes nausea and vomiting. Can take injection without issue   Tramadol Nausea And Vomiting   Verapamil Nausea And Vomiting    REACTION: intolerance     Current Outpatient Medications  Medication Sig Dispense Refill   albuterol (VENTOLIN HFA) 108 (90 Base) MCG/ACT inhaler Inhale 2 puffs into the lungs as needed.     amitriptyline (ELAVIL) 10 MG tablet Take by mouth.     amoxicillin (AMOXIL) 500 MG capsule Before dental procedures     colestipol (COLESTID) 1 g tablet Take 1 g by mouth 2 (two) times daily.     Cyanocobalamin (VITAMIN B-12 IJ) Inject 1 mL as directed every 30 (thirty) days.      donepezil (ARICEPT) 10 MG tablet TAKE ONE TABLET BY MOUTH EVERY NIGHT AT BEDTIME 90 tablet 3   gabapentin (NEURONTIN) 300 MG  capsule TAKE 1 TO 2 CAPSULES BY MOUTH UP TO TWO TIMES DAILY AS NEEDED (Patient taking differently: Take 300 mg by mouth 2 (two) times daily.) 180 capsule 3   ibuprofen (ADVIL) 600 MG tablet ibuprofen 600 mg tablet  TAKE 1 TABLET BY MOUTH EVERY 6 HOURS AS NEEDED     ipratropium (ATROVENT) 0.03 % nasal spray Place 2 sprays into both nostrils every 12 (twelve) hours. 30 mL 1   leflunomide (ARAVA) 20 MG tablet Take 1 tablet (20 mg total) by mouth daily. 90 tablet 0   Loratadine (CLARITIN PO) Take by mouth.     Melatonin 10 MG CAPS Take 10 capsules by mouth daily.     memantine (NAMENDA) 10 MG tablet Take 1 tablet (10 mg total) by mouth 2 (two) times daily. 180 tablet 4   metoprolol tartrate (LOPRESSOR) 100 MG tablet TAKE 1 AND 1/2 TABLETS TWICE DAILY 45 tablet 0   ondansetron (ZOFRAN-ODT) 8 MG disintegrating tablet Take 1 tablet by mouth as needed for nausea.     pantoprazole (PROTONIX) 40 MG tablet Take 40 mg by mouth 2 (two) times daily.     polyethylene glycol powder (GLYCOLAX/MIRALAX) 17 GM/SCOOP powder Take 17 g by mouth daily as needed for mild constipation or moderate constipation.      primidone (MYSOLINE) 50 MG tablet Take 1 tablet (50 mg total) by mouth at  bedtime. 90 tablet 3   valsartan (DIOVAN) 160 MG tablet Take 1 tablet (160 mg total) by mouth 2 (two) times daily. 180 tablet 3   venlafaxine XR (EFFEXOR-XR) 75 MG 24 hr capsule TAKE ONE CAPSULE BY MOUTH DAILY WITH BREAFAST 90 capsule 3   zoledronic acid (RECLAST) 5 MG/100ML SOLN injection      ondansetron (ZOFRAN) 4 MG tablet Take by mouth as needed. (Patient not taking: Reported on 06/02/2022)     No current facility-administered medications for this visit.     Past Medical History:  Diagnosis Date   Anal fissure    Anemia    Arthritis    Blood transfusion    C. difficile colitis    Chest pain    a. 01/2013 MV: EF 59%, no ischemia.   Chronic Dyspnea    a. 01/2013 Echo: EF 60-65%, Gr 1 DD, PASP 23mHg.// Echo 01/2020: EF 60-65, no  RWMA, GR 1 DD, GLS -21.6%, normal RV SF, trivial MR    COVID-19    DDD (degenerative disc disease), cervical    DDD (degenerative disc disease), lumbar    Diabetes mellitus    Fibromyalgia    Gall stones    GERD (gastroesophageal reflux disease)    gastritis   Hemorrhoids    Hypertension    Interstitial cystitis    MCI (mild cognitive impairment) 11/25/2017   Memory difficulty 12/14/2013   Neuromuscular disorder (HCC)    sclerosis   Osteoporosis    Peptic ulcer    PONV (postoperative nausea and vomiting)    Pseudogout    Raynaud phenomenon    Rectal bleeding    Sleep apnea    a. on cpap.   SVT (supraventricular tachycardia) ([facility]    Syncope 09/10/2015   Thyroid disease    hypothyroidism   Tremor, essential 05/14/2016    ROS:   All systems reviewed and negative except as noted in the HPI.   Past Surgical History:  Procedure Laterality Date   APPENDECTOMY     BLADDER SURGERY     x2   BLADDER SURGERY     BREAST BIOPSY     CARDIAC CATHETERIZATION     CARPAL TUNNEL RELEASE     CATARACT EXTRACTION Bilateral    CHOLECYSTECTOMY     DILATION AND CURETTAGE OF UTERUS     ENTEROCELE REPAIR     x2   EYE SURGERY     retina   herniated disc  11/25/2021   L4 and L5   hysterectomy - unknown type     JOINT REPLACEMENT     KNEE ARTHROPLASTY     KNEE SURGERY     x6   NECK SURGERY     fusion   OOPHORECTOMY     QUADRICEPS REPAIR Right    RECTOCELE REPAIR     x2   SHOULDER ARTHROSCOPY WITH SUBACROMIAL DECOMPRESSION, ROTATOR CUFF REPAIR AND BICEP TENDON REPAIR  10/06/2012   Procedure: SHOULDER ARTHROSCOPY WITH SUBACROMIAL DECOMPRESSION, ROTATOR CUFF REPAIR AND BICEP TENDON REPAIR;  Surgeon: JNita Sells MD;  Location: [facility]  Service: Orthopedics;  Laterality: Right;  Arthroscopic  Repair  of  Subscapularis, Open Biceps Tenodesis   SHOULDER SURGERY     bilateral- bones spur   SHOULDER SURGERY Left 04/15/2021   TONSILLECTOMY     TOTAL KNEE  ARTHROPLASTY     TOTAL SHOULDER ARTHROPLASTY       Family History  Problem Relation Age of Onset   Heart attack Mother  Stroke Mother    Diabetes Mother    Heart disease Mother    Colon polyps Mother    Asthma Mother    Dementia Mother    Heart disease Father    Heart attack Father    Asthma Father    Stroke Sister    Hypertension Sister    Rheum arthritis Sister    Dementia Sister    Asthma Sister    Parkinson's disease Sister    Lupus Sister    Asthma Sister    Breast cancer Maternal Grandmother    Wilson's disease Maternal Grandmother    Pancreatic cancer Maternal Grandfather      Social History   Socioeconomic History   Marital status: Married    Spouse name: Jenny Reichmann "[name]"   Number of children: 2   Years of education: 14   Highest education level: Associate degree: academic program  Occupational History   Occupation: Retired  Tobacco Use   Smoking status: Never    Passive exposure: Never   Smokeless tobacco: Never  Vaping Use   Vaping Use: Never used  Substance and Sexual Activity   Alcohol use: No    Alcohol/week: 0.0 standard drinks of alcohol   Drug use: No   Sexual activity: Not on file  Other Topics Concern   Not on file  Social History Narrative   Lives with her husband. She likes to hang out with her friends and play games and do bible study.   Social Determinants of Health   Financial Resource Strain: Low Risk  (05/05/2022)   Overall Financial Resource Strain (CARDIA)    Difficulty of Paying Living Expenses: Not hard at all  Food Insecurity: No Food Insecurity (05/05/2022)   Hunger Vital Sign    Worried About Running Out of Food in the Last Year: Never true    Ran Out of Food in the Last Year: Never true  Transportation Needs: No Transportation Needs (05/05/2022)   PRAPARE - Hydrologist (Medical): No    Lack of Transportation (Non-Medical): No  Physical Activity: Inactive (05/05/2022)   Exercise Vital Sign     Days of Exercise per Week: 0 days    Minutes of Exercise per Session: 0 min  Stress: No Stress Concern Present (05/05/2022)   [place]    Feeling of Stress : Not at all  Social Connections: [place] (05/05/2022)   Social Connection and Isolation Panel [NHANES]    Frequency of Communication with Friends and Family: More than three times a week    Frequency of Social Gatherings with Friends and Family: More than three times a week    Attends Religious Services: More than 4 times per year    Active Member of [employer] or Organizations: Yes    Attends Music therapist: More than 4 times per year    Marital Status: Married  Human resources officer Violence: Not At Risk (05/05/2022)   Humiliation, Afraid, Rape, and Kick questionnaire    Fear of Current or Ex-Partner: No    Emotionally Abused: No    Physically Abused: No    Sexually Abused: No     BP (!) 146/86   Pulse 64   Ht '5\' 6"'$  (1.676 m)   Wt 180 lb 6.4 oz (81.8 kg)   SpO2 94%   BMI 29.12 kg/m    BP check by me was 138/86.  Physical Exam:  Well appearing NAD HEENT: Unremarkable  Neck:  No JVD, no thyromegally Lymphatics:  No adenopathy Back:  No CVA tenderness Lungs:  Clear with no wheezes HEART:  Regular rate rhythm, no murmurs, no rubs, no clicks Abd:  soft, positive bowel sounds, no organomegally, no rebound, no guarding Ext:  2 plus pulses, no edema, no cyanosis, no clubbing Skin:  No rashes no nodules Neuro:  CN II through XII intact, motor grossly intact  EKG - NSR    Assess/Plan:  1. Palpitations - she has not had any documented SVT in many years. I was confused that she had catheter ablation years ago before EMR but she states that she has never had an ablation. She has remotely had documented SVT.  She will continue her beta blocker. I have recommended she wear a 14 day zio monitor.  2. HTN - this is her biggest problem. She is on  losartan and metoprolol and norvasc. I strongly encouraged her to avoid salty food. We might consider switching to coreg in the future. 3. Dementia - she is on both aricept and namenda. We will follow. 4. Preop eval - she is low risk for pending knee surgery. Restart her beta blocker as soon as possible after surgery.    Carleene Overlie Baylen Dea,MD

## 2022-06-03 ENCOUNTER — Ambulatory Visit: Payer: Medicare Other

## 2022-06-04 ENCOUNTER — Inpatient Hospital Stay: Admit: 2022-06-04 | Payer: MEDICARE | Primary: Internal Medicine

## 2022-06-04 DIAGNOSIS — M542 Cervicalgia: Secondary | ICD-10-CM

## 2022-06-04 NOTE — Progress Notes (Signed)
PHYSICAL / OCCUPATIONAL THERAPY - DAILY TREATMENT NOTE (updated 2/23)    Patient Name: Laura Roman    Date: 06/04/2022    DOB: May 05, 1951  Insurance: Payor: MEDICARE / Plan: MEDICARE PART A AND B / Product Type: *No Product type* /      Patient DOB verified Yes     Visit #   Current / Total 1 4   Time   In / Out 1034 1114   Pain   In / Out 1 1   Subjective Functional Status/Changes: See eval   Changes to:  Meds, Allergies, Med Hx, Sx Hx?  If yes, update Summary List no       TREATMENT AREA =  Neck pain [M54.2]    OBJECTIVE    Therapeutic Procedures:  Tx Min Billable or 1:1 Min (if diff from Tx Min) Procedure, Rationale, Specifics   15  97110 Therapeutic Exercise (timed):  increase ROM, strength, coordination, balance, and proprioception to improve patient's ability to progress to PLOF and address remaining functional goals. (see flow sheet as applicable)     Details if applicable:       8  62694 Self Care/Home Management (timed):  improve patient knowledge and understanding of pain reducing techniques, positioning, diagnosis/prognosis, and physical therapy expectations, procedures and progression  to improve patient's ability to progress to PLOF and address remaining functional goals.  (see flow sheet as applicable)     Details if applicable:            Details if applicable:            Details if applicable:            Details if applicable:     68  MC BC Totals Reminder: bill using total billable min of TIMED therapeutic procedures (example: do not include dry needle or estim unattended, both untimed codes, in totals to left)  8-22 min = 1 unit; 23-37 min = 2 units; 38-52 min = 3 units; 53-67 min = 4 units; 68-82 min = 5 units   Total Total     TOTAL TREATMENT TIME:        40     '[x]'$   Patient Education billed concurrently with other procedures   '[x]'$  Review HEP    '[]'$  Progressed/Changed HEP, detail:    '[]'$  Other detail:       Objective Information/Functional Measures/Assessment    See POC    Patient will continue to  benefit from skilled PT / OT services to modify and progress therapeutic interventions, analyze and address functional mobility deficits, analyze and address ROM deficits, analyze and address soft tissue restrictions, analyze and cue for proper movement patterns, and analyze and modify for postural abnormalities to address functional deficits and attain remaining goals.    Progress toward goals / Updated goals:  '[]'$   See Progress Note/Recertification    Short Term Goals: To be accomplished in 2 weeks  1. I and compliant with HEP for self management of symptoms.   IE-issued HEP  Long Term Goals: To be accomplished in 4 weeks  1. Improve FOTO to 52 to indicate improved function with daily activities.   [id number]  2. Increase C/S B lateral flex and rotation by 5 deg to increase ease with ADLs.  IE-right lat flex 17; left lat flex 11; right rotation 44; left rot 42  3. Increase mandibular opening to 3 fingers to increase ease with eating sandwiches.  IE-2 fingers  4. Patient will report >=  50% improvement to increase ease with ADLs.  IE-0%    PLAN  Yes  Continue plan of care  '[]'$   Upgrade activities as tolerated  '[]'$   Discharge due to :  '[]'$   Other:    Weston Settle, PT, CMTPT    06/04/2022    1:02 PM    Future Appointments   Date Time Provider [place]   06/15/2022  1:50 PM Weston Settle, PT [facility]   06/23/2022 11:10 AM Weston Settle, PT [facility]   07/01/2022 11:10 AM Weston Settle, PT [facility]   07/08/2022 11:10 AM Weston Settle, PT [facility]   07/09/2022  9:25 AM [facility] VISIT [facility]   07/16/2022  9:20 AM Raymund Jene Every, MD [facility]   11/19/2022 10:30 AM Clancy Gourd, MD [facility]

## 2022-06-04 NOTE — Progress Notes (Signed)
[address] [place] [address] UE:[phone] Fx: [phone]    PLAN OF CARE/ Statement of Necessity for Physical Therapy Services           Patient name: Laura Roman Start of Care: 06/04/2022   Referral source: Albin Felling, MD DOB: 09-13-1951    Medical Diagnosis: Neck pain [M54.2]       Onset Date:March 2023    Treatment Diagnosis: M54.2  NECK PAIN                                     Prior Hospitalization: see medical history Provider#: [id number]   Medications: Verified on Patient Summary List     Comorbidities: Breast CA with radiation; allergies; anxiety; DM; neuropathy  Prior Level of Function: Able to eat all food without pain    The Plan of Care and following information is based on the information from the initial evaluation.    Assessment / key information:  71 y.o. year old female presents with CC of left sided neck and jaw pain that began in march 2023 after patient had left wisdom tooth removed. Signs and symptoms are consistent with mechanical neck and jaw pain. Impairments noted today: decreased C/S lateral flex B; decreased B C/S rotation with pain on left with B rotation; increased mm restrictions  in left upper quadrant, specifically in left masseter, scalenes, B SCMs;decreased mandibular opening with pain at end-range. Patient will benefit from physical therapy to address deficits, and ultimately to return patient to prior level of function.      Evaluation Complexity:  History:  HIGH Complexity :3+ comorbidities / personal factors will impact the outcome/ POC ; Examination:  MEDIUM Complexity : 3 Standardized tests and measures addressin body structure, function, activity limitation and / or participation in recreation  ;Presentation:  MEDIUM Complexity : Evolving with changing characteristics  ;Clinical Decision Making:  MEDIUM Complexity : FOTO score of 26-74 FOTO score = an established functional score where  100 = no disability  Overall Complexity Rating: MEDIUM  Problem List: pain affecting function, decrease ROM, decrease strength, decrease ADL/functional abilities, decrease activity tolerance, and decrease flexibility/joint mobility   Treatment Plan may include any combination of the following: 97110 Therapeutic Exercise, 97112 Neuromuscular Re-Education, 97140 Manual Therapy, 97530 Therapeutic Activity, 97535 Self Care/Home Management, and [phone] Electrical Stim attended  Patient / Family readiness to learn indicated by: asking questions, trying to perform skills, interest, return verbalization , and return demonstration   Persons(s) to be included in education: patient (P)  Barriers to Learning/Limitations: none  Measures taken if barriers to learning present: none  Patient Goal (s): no pain when opening my mouth  Patient Self Reported Health Status: good  Rehabilitation Potential: good    Short Term Goals: To be accomplished in 2 weeks  1. I and compliant with HEP for self management of symptoms.   Long Term Goals: To be accomplished in 4 weeks  1. Improve FOTO to 52 to indicate improved function with daily activities.   2. Increase C/S B lateral flex and rotation by 5 deg to increase ease with ADLs.  3. Increase mandibular opening to 3 fingers to increase ease with eating sandwiches.  4. Patient will report >=50% improvement to increase ease with ADLs.    Frequency / Duration: Patient to be seen 1 times per week for  4 weeks  Goals will be assigned and reassessed every 10 visits/ 30 days per guidelines .    Patient/ Caregiver education and instruction: Diagnosis, prognosis, self care and exercises '[x]'$   Plan of care has been reviewed with PTA    Certification Period: 06/04/22-09/02/22    Weston Settle, PT       06/04/2022       81:85 PM    I certify that the above Therapy Services are being furnished while the patient is under my care. I agree with the treatment plan and certify that this therapy is  necessary.    Physician's Signature:_________________________   DATE:_________   TIME:________                           Albin Felling, MD  Insurance: Payor: MEDICARE / Plan: MEDICARE PART A AND B / Product Type: *No Product type* /      ** Signature, Date and Time must be completed for valid certification **  Please sign and return to [facility] or you may fax the signed copy to [phone]. Thank you.

## 2022-06-05 ENCOUNTER — Ambulatory Visit: Payer: Medicare Other

## 2022-06-06 ENCOUNTER — Ambulatory Visit
Admission: EM | Admit: 2022-06-06 | Discharge: 2022-06-06 | Disposition: A | Discharge status: Home / Self Care | Payer: Medicare Other | Attending: Family Medicine | Admitting: Family Medicine

## 2022-06-06 ENCOUNTER — Other Ambulatory Visit: Payer: Self-pay

## 2022-06-06 DIAGNOSIS — R059 Cough, unspecified: Secondary | ICD-10-CM

## 2022-06-06 DIAGNOSIS — R519 Headache, unspecified: Secondary | ICD-10-CM | POA: Diagnosis not present

## 2022-06-06 DIAGNOSIS — U071 COVID-19: Secondary | ICD-10-CM

## 2022-06-06 DIAGNOSIS — R509 Fever, unspecified: Secondary | ICD-10-CM

## 2022-06-06 MED ORDER — BENZONATATE 200 MG PO CAPS: 200.0000 mg | ORAL_CAPSULE | Freq: Three times a day (TID) | ORAL | 0 refills | Status: AC | PRN

## 2022-06-06 MED ORDER — ACETAMINOPHEN 325 MG PO TABS
650.0000 mg | ORAL_TABLET | Freq: Once | ORAL | Status: AC
  Administered 2022-06-06: 650 mg via ORAL

## 2022-06-06 MED ORDER — NIRMATRELVIR/RITONAVIR (PAXLOVID)TABLET: 3.0000 | ORAL_TABLET | Freq: Two times a day (BID) | ORAL | 0 refills | Status: AC

## 2022-06-06 MED ORDER — METOCLOPRAMIDE HCL 5 MG/ML IJ SOLN
10.0000 mg | INTRAMUSCULAR | Status: AC
  Administered 2022-06-06: 10 mg via INTRAMUSCULAR

## 2022-06-06 MED ORDER — KETOROLAC TROMETHAMINE 60 MG/2ML IM SOLN
60.0000 mg | Freq: Once | INTRAMUSCULAR | Status: AC
  Administered 2022-06-06: 60 mg via INTRAMUSCULAR

## 2022-06-06 NOTE — ED Provider Notes (Signed)
[name]    CSN: [id number] Arrival date & time: 06/06/22  1104      History   Chief Complaint Chief Complaint  Patient presents with   Cough    Cough, chills, vomiting, body aches and headache. X5 days    HPI Mia [name] is a 71 y.o. female.   HPI 71 year old female presents with cough, chills, vomiting, body aches and headache for 5 days.  Patient reports she was exposed to COVID-19 and has tested positive for COVID-19.  Patient is currently febrile at 100.4.  PMH significant for obesity, fibromyalgia, and T2DM.  Patient is accompanied by her friend this morning.  Past Medical History:  Diagnosis Date   Anal fissure    Anemia    Arthritis    Blood transfusion    C. difficile colitis    Chest pain    a. 01/2013 MV: EF 59%, no ischemia.   Chronic Dyspnea    a. 01/2013 Echo: EF 60-65%, Gr 1 DD, PASP 47mHg.// Echo 01/2020: EF 60-65, no RWMA, GR 1 DD, GLS -21.6%, normal RV SF, trivial MR    COVID-19    DDD (degenerative disc disease), cervical    DDD (degenerative disc disease), lumbar    Diabetes mellitus    Fibromyalgia    Gall stones    GERD (gastroesophageal reflux disease)    gastritis   Hemorrhoids    Hypertension    Interstitial cystitis    MCI (mild cognitive impairment) 11/25/2017   Memory difficulty 12/14/2013   Neuromuscular disorder (HCC)    sclerosis   Osteoporosis    Peptic ulcer    PONV (postoperative nausea and vomiting)    Pseudogout    Raynaud phenomenon    Rectal bleeding    Sleep apnea    a. on cpap.   SVT (supraventricular tachycardia) (HCC)    Syncope 09/10/2015   Thyroid disease    hypothyroidism   Tremor, essential 05/14/2016    Patient Active Problem List   Diagnosis Date Noted   Palpitations 06/02/2022   Pernicious anemia 09/11/2020   Cyst of skin and subcutaneous tissue 09/11/2020   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   Weakness of both lower extremities 06/11/2020   Hyponatremia 05/22/2020   Gait instability  06/20/2019   Facet arthritis of lumbar region 05/10/2018   MCI (mild cognitive impairment) 11/25/2017   Restrictive lung disease 07/30/2017   Mixed hyperlipidemia 06/18/2017   Rectocele 04/26/2017   Irritable bowel syndrome with both constipation and diarrhea 04/26/2017   Osteoporosis 04/18/2017   Trochanteric bursitis of both hips 02/24/2017   Stress fracture of left tibia 11/05/2016   Inflammatory arthritis 09/30/2016   High risk medication use 09/30/2016   History of Clostridium difficile colitis 09/30/2016   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   Pseudogout 09/30/2016   DDD cervical spine status post fusion 09/30/2016   DDD thoracic spine 09/30/2016   H/O total knee replacement, right 09/30/2016   Age-related osteoporosis without current pathological fracture 09/30/2016   Tremor, essential 05/14/2016   Right foot pain 04/14/2016   B12 deficiency 09/10/2015   Syncope 09/10/2015   Cervical facet joint syndrome 09/10/2015   Chronic fatigue 09/10/2015   Aortic atherosclerosis ([place] 04/01/2015   DOE (dyspnea on exertion)    Primary osteoarthritis of right knee 08/13/2014   Benign head tremor 06/11/2014   Lumbar degenerative disc disease 06/11/2014   IFG (impaired fasting glucose) 03/09/2014   Hemorrhoid 03/09/2014   Retinal wrinkling, right eye 03/09/2014   Fibromyalgia 03/09/2014  GERD (gastroesophageal reflux disease) 03/09/2014   History of arthroplasty of right knee 01/31/2014   Memory difficulty 12/14/2013   Hemorrhoids, external, thrombosed 06/22/2011   Hypertension 03/11/2011   SVT (supraventricular tachycardia) ([place])    Raynaud phenomenon    EDEMA 08/25/2010   Nonspecific (abnormal) findings on radiological and other examination of body structure 08/25/2010   COMPUTERIZED TOMOGRAPHY, CHEST, ABNORMAL 08/25/2010   OSA (obstructive sleep apnea) 04/24/2009   Allergic rhinitis 06/11/2008   Dyspnea 06/11/2008   PAROXYSMAL SUPRAVENTRICULAR TACHYCARDIA  06/08/2008   Chronic diastolic CHF (congestive heart failure) ([place]) 06/08/2008   INTERSTITIAL CYSTITIS 06/08/2008    Past Surgical History:  Procedure Laterality Date   APPENDECTOMY     BLADDER SURGERY     x2   BLADDER SURGERY     BREAST BIOPSY     CARDIAC CATHETERIZATION     CARPAL TUNNEL RELEASE     CATARACT EXTRACTION Bilateral    CHOLECYSTECTOMY     DILATION AND CURETTAGE OF UTERUS     ENTEROCELE REPAIR     x2   EYE SURGERY     retina   herniated disc  11/25/2021   L4 and L5   hysterectomy - unknown type     JOINT REPLACEMENT     KNEE ARTHROPLASTY     KNEE SURGERY     x6   NECK SURGERY     fusion   OOPHORECTOMY     QUADRICEPS REPAIR Right    RECTOCELE REPAIR     x2   SHOULDER ARTHROSCOPY WITH SUBACROMIAL DECOMPRESSION, ROTATOR CUFF REPAIR AND BICEP TENDON REPAIR  10/06/2012   Procedure: SHOULDER ARTHROSCOPY WITH SUBACROMIAL DECOMPRESSION, ROTATOR CUFF REPAIR AND BICEP TENDON REPAIR;  Surgeon: Nita Sells, MD;  Location: [facility];  Service: Orthopedics;  Laterality: Right;  Arthroscopic  Repair  of  Subscapularis, Open Biceps Tenodesis   SHOULDER SURGERY     bilateral- bones spur   SHOULDER SURGERY Left 04/15/2021   TONSILLECTOMY     TOTAL KNEE ARTHROPLASTY     TOTAL SHOULDER ARTHROPLASTY      OB History   No obstetric history on file.      Home Medications    Prior to Admission medications   Medication Sig Start Date End Date Taking? Authorizing Provider  albuterol (VENTOLIN HFA) 108 (90 Base) MCG/ACT inhaler Inhale 2 puffs into the lungs as needed. 10/17/21  Yes [provider]  amitriptyline (ELAVIL) 10 MG tablet Take by mouth. 01/15/22  Yes [provider]  benzonatate (TESSALON) 200 MG capsule Take 1 capsule (200 mg total) by mouth 3 (three) times daily as needed for up to 7 days. 06/06/22 06/13/22 Yes Eliezer Lofts, FNP  colestipol (COLESTID) 1 g tablet Take 1 g by mouth 2 (two) times daily.   Yes [provider]  Cyanocobalamin (VITAMIN B-12 IJ) Inject 1 mL as directed every 30 (thirty) days.    Yes [provider]  donepezil (ARICEPT) 10 MG tablet TAKE ONE TABLET BY MOUTH EVERY NIGHT AT BEDTIME 11/13/21  Yes Millikan, Megan, NP  gabapentin (NEURONTIN) 300 MG capsule TAKE 1 TO 2 CAPSULES BY MOUTH UP TO TWO TIMES DAILY AS NEEDED Patient taking differently: Take 300 mg by mouth 2 (two) times daily. 10/24/19  Yes Hali Marry, MD  ibuprofen (ADVIL) 600 MG tablet ibuprofen 600 mg tablet  TAKE 1 TABLET BY MOUTH EVERY 6 HOURS AS NEEDED   Yes [provider]  ipratropium (ATROVENT) 0.03 % nasal spray Place  2 sprays into both nostrils every 12 (twelve) hours. 12/11/20  Yes Breeback, Jade L, PA-C  leflunomide (ARAVA) 20 MG tablet Take 1 tablet (20 mg total) by mouth daily. 05/05/22  Yes Deveshwar, Abel Presto, MD  Loratadine (CLARITIN PO) Take by mouth.   Yes [provider]  Melatonin 10 MG CAPS Take 10 capsules by mouth daily.   Yes [provider]  memantine (NAMENDA) 10 MG tablet Take 1 tablet (10 mg total) by mouth 2 (two) times daily. 11/13/21  Yes Ade Givens, NP  metoprolol tartrate (LOPRESSOR) 100 MG tablet TAKE 1 AND 1/2 TABLETS TWICE DAILY 03/26/21  Yes Nahser, Wonda Cheng, MD  nirmatrelvir/ritonavir EUA (PAXLOVID) 20 x 150 MG & 10 x '100MG'$  TABS Take 3 tablets by mouth 2 (two) times daily for 5 days. Patient GFR is 95. Take nirmatrelvir (150 mg) two tablets twice daily for 5 days and ritonavir (100 mg) one tablet twice daily for 5 days. 06/06/22 06/11/22 Yes Eliezer Lofts, FNP  ondansetron (ZOFRAN) 4 MG tablet Take by mouth as needed. 11/25/21  Yes [provider]  ondansetron (ZOFRAN-ODT) 8 MG disintegrating tablet Take 1 tablet by mouth as needed for nausea. 04/26/20  Yes [provider]  pantoprazole (PROTONIX) 40 MG tablet Take 40 mg by mouth 2 (two) times daily.   Yes [provider]  polyethylene glycol powder  (GLYCOLAX/MIRALAX) 17 GM/SCOOP powder Take 17 g by mouth daily as needed for mild constipation or moderate constipation.    Yes [provider]  primidone (MYSOLINE) 50 MG tablet Take 1 tablet (50 mg total) by mouth at bedtime. 05/14/22  Yes Puccio Givens, NP  valsartan (DIOVAN) 160 MG tablet Take 1 tablet (160 mg total) by mouth 2 (two) times daily. 05/18/22  Yes Nahser, Wonda Cheng, MD  venlafaxine XR (EFFEXOR-XR) 75 MG 24 hr capsule TAKE ONE CAPSULE BY MOUTH DAILY WITH BREAFAST 11/13/21  Yes Alphin Givens, NP  zoledronic acid (RECLAST) 5 MG/100ML SOLN injection  01/07/22  Yes [provider]  amoxicillin (AMOXIL) 500 MG capsule Before dental procedures 12/16/21   [provider]    Family History Family History  Problem Relation Age of Onset   Heart attack Mother    Stroke Mother    Diabetes Mother    Heart disease Mother    Colon polyps Mother    Asthma Mother    Dementia Mother    Heart disease Father    Heart attack Father    Asthma Father    Stroke Sister    Hypertension Sister    Rheum arthritis Sister    Dementia Sister    Asthma Sister    Parkinson's disease Sister    Lupus Sister    Asthma Sister    Breast cancer Maternal Grandmother    Wilson's disease Maternal Grandmother    Pancreatic cancer Maternal Grandfather     Social History Social History   Tobacco Use   Smoking status: Never    Passive exposure: Never   Smokeless tobacco: Never  Vaping Use   Vaping Use: Never used  Substance Use Topics   Alcohol use: No    Alcohol/week: 0.0 standard drinks of alcohol   Drug use: No     Allergies   Codeine, Myrbetriq  [mirabegron], Nystatin, Percocet [oxycodone-acetaminophen], Propoxyphene, Oxycodone-acetaminophen, Celebrex [celecoxib], Darvocet [propoxyphene n-acetaminophen], Erythromycin, Hydrocodone, Lyrica [pregabalin], Macrodantin [nitrofurantoin], Toradol [ketorolac tromethamine], Tramadol, and Verapamil   Review of  Systems Review of Systems   Physical Exam Triage Vital Signs ED Triage  Vitals  Enc Vitals Group     BP 06/06/22 1120 129/85     Pulse Rate 06/06/22 1120 96     Resp 06/06/22 1120 18     Temp 06/06/22 1120 (!) 100.4 F (38 C)     Temp Source 06/06/22 1120 Oral     SpO2 06/06/22 1120 94 %     Weight 06/06/22 1117 177 lb (80.3 kg)     Height 06/06/22 1117 '5\' 6"'$  (1.676 m)     Head Circumference --      Peak Flow --      Pain Score 06/06/22 1117 7     Pain Loc --      Pain Edu? --      Excl. in [place]? --    No data found.  Updated Vital Signs BP 129/85 (BP Location: Right Arm)   Pulse 96   Temp (!) 100.4 F (38 C) (Oral)   Resp 18   Ht '5\' 6"'$  (1.676 m)   Wt 177 lb (80.3 kg)   SpO2 94%   BMI 28.57 kg/m      Physical Exam Vitals and nursing note reviewed. Exam conducted with a chaperone present.  Constitutional:      Appearance: Normal appearance. She is normal weight. She is ill-appearing.  HENT:     Head: Normocephalic and atraumatic.     Right Ear: Tympanic membrane and external ear normal.     Left Ear: Tympanic membrane and external ear normal.     Ears:     Comments: Moderate eustachian tube dysfunction noted bilaterally    Mouth/Throat:     Mouth: Mucous membranes are moist.     Pharynx: Oropharynx is clear.  Eyes:     Extraocular Movements: Extraocular movements intact.     Conjunctiva/sclera: Conjunctivae normal.     Pupils: Pupils are equal, round, and reactive to light.  Cardiovascular:     Rate and Rhythm: Normal rate and regular rhythm.     Pulses: Normal pulses.     Heart sounds: Normal heart sounds.  Pulmonary:     Effort: Pulmonary effort is normal.     Breath sounds: Normal breath sounds. No wheezing, rhonchi or rales.     Comments: Infrequent nonproductive cough noted on exam Musculoskeletal:     Cervical back: Normal range of motion and neck supple.  Lymphadenopathy:     Cervical: Cervical adenopathy present.  Skin:    General: Skin is  warm and dry.  Neurological:     General: No focal deficit present.     Mental Status: She is alert and oriented to person, place, and time. Mental status is at baseline.      UC Treatments / Results  Labs (all labs ordered are listed, but only abnormal results are displayed) Labs Reviewed - No data to display  EKG   Radiology No results found.  Procedures Procedures (including critical care time)  Medications Ordered in UC Medications  acetaminophen (TYLENOL) tablet 650 mg (650 mg Oral Given 06/06/22 1147)  ketorolac (TORADOL) injection 60 mg (60 mg Intramuscular Given 06/06/22 1231)  metoCLOPramide (REGLAN) injection 10 mg (10 mg Intramuscular Given 06/06/22 1233)    Initial Impression / Assessment and Plan / UC Course  I have reviewed the triage vital signs and the nursing notes.  Pertinent labs & imaging results that were available during my care of the patient were reviewed by me and considered in my medical decision making (see chart for details).  MDM: 1.  COVID-19-Rx'd Paxlovid; 2.  Cough-Rx'd Tessalon Perles; 3.  Bad headache-IM Toradol 60 mg, IM Reglan 10 mg given once in clinic prior to discharge; 4.  Fever-Tylenol 1000 mg given to patient while here in clinic. Advised patient to take medication as directed with food to completion.  Encouraged patient to increase daily water intake while taking this medication.  Advised patient may take OTC Tylenol 1000 mg 1-3 times daily.  Advised may take Tessalon Perles daily or as needed for cough.  Advised patient to remain self quarantine for the next 5 days or until Thursday, 06/11/2022.  Advised if symptoms worsen and/or unresolved please follow-up with PCP or here for further evaluation.  Patient discharged home, hemodynamically stable. Final Clinical Impressions(s) / UC Diagnoses   Final diagnoses:  COVID-19  Cough, unspecified type  Bad headache  Fever, unspecified     Discharge Instructions      Advised patient  to take medication as directed with food to completion.  Encouraged patient to increase daily water intake while taking this medication.  Advised patient may take OTC Tylenol 1000 mg 1-3 times daily.  Advised may take Tessalon Perles daily or as needed for cough.  Advised patient to remain self quarantine for the next 5 days or until Thursday, 06/11/2022.  Advised if symptoms worsen and/or unresolved please follow-up with PCP or here for further evaluation.     ED Prescriptions     Medication Sig Dispense Auth. Provider   nirmatrelvir/ritonavir EUA (PAXLOVID) 20 x 150 MG & 10 x '100MG'$  TABS Take 3 tablets by mouth 2 (two) times daily for 5 days. Patient GFR is 95. Take nirmatrelvir (150 mg) two tablets twice daily for 5 days and ritonavir (100 mg) one tablet twice daily for 5 days. 30 tablet Eliezer Lofts, FNP   benzonatate (TESSALON) 200 MG capsule Take 1 capsule (200 mg total) by mouth 3 (three) times daily as needed for up to 7 days. 40 capsule Eliezer Lofts, FNP      PDMP not reviewed this encounter.   Eliezer Lofts, [place] 06/06/22 1257

## 2022-06-06 NOTE — ED Triage Notes (Signed)
Pt states that she has a cough, chills, vomiting, body aches and headache. X5 days  Pt states that she has been exposed to covid.  Pt states that she had a positive covid test.  Pt states that she is vaccinated for covid.

## 2022-06-06 NOTE — Discharge Instructions (Addendum)
Advised patient to take medication as directed with food to completion.  Encouraged patient to increase daily water intake while taking this medication.  Advised patient may take OTC Tylenol 1000 mg 1-3 times daily.  Advised may take Tessalon Perles daily or as needed for cough.  Advised patient to remain self quarantine for the next 5 days or until Thursday, 06/11/2022.  Advised if symptoms worsen and/or unresolved please follow-up with PCP or here for further evaluation.

## 2022-06-07 ENCOUNTER — Telehealth: Payer: Self-pay

## 2022-06-07 NOTE — Telephone Encounter (Signed)
TCT pt to f/u from recent visit. Pt denied any questions, concerns, or needs at this time.

## 2022-06-09 ENCOUNTER — Ambulatory Visit: Payer: Medicare Other

## 2022-06-09 NOTE — Telephone Encounter (Signed)
Clearance form and last office note faxed [facility] attn Clerance Lav. Received a receipt of confirmation.

## 2022-06-09 NOTE — Telephone Encounter (Signed)
Form signed.

## 2022-06-11 ENCOUNTER — Telehealth: Payer: Self-pay | Admitting: Neurology

## 2022-06-11 NOTE — Telephone Encounter (Signed)
Patient LVM stating was diagnosed with covid last Saturday.   She states she has not been able to sleep and asking if we have any suggestions.   I called patient to see what seems to be keeping her up (cough, etc) she states no cough. She is just having a hard time falling asleep. She already takes Melatonin 10 mg nightly. She is wondering what she can do to help get some rest.   Current symptoms include: fatigue, diarrhea, and headaches.   Please advise.

## 2022-06-11 NOTE — Telephone Encounter (Signed)
I would just recommend an antihistamine such as Benadryl if she can take that and if she already takes Claritin so it is okay to take the Benadryl on top of that and see if it helps.

## 2022-06-12 ENCOUNTER — Ambulatory Visit: Payer: Medicare Other

## 2022-06-12 NOTE — Telephone Encounter (Signed)
Patient made aware of recommendations and agrees with plan.

## 2022-06-12 NOTE — Telephone Encounter (Signed)
LVM for patient to call back. ?

## 2022-06-15 ENCOUNTER — Inpatient Hospital Stay: Admit: 2022-06-15 | Payer: MEDICARE | Primary: Internal Medicine

## 2022-06-15 NOTE — Progress Notes (Signed)
PHYSICAL / OCCUPATIONAL THERAPY - DAILY TREATMENT NOTE (updated 2/23)    Patient Name: Laura Roman    Date: 06/15/2022    DOB: [birth year]  Insurance: Payor: MEDICARE / Plan: MEDICARE PART A AND B / Product Type: *No Product type* /      Patient DOB verified Yes     Visit #   Current / Total 2 4   Time   In / Out 150                                    230   Pain   In / Out 3 3   Subjective Functional Status/Changes: Reports her HEP is "fair", when she gets to it.    Changes to:  Meds, Allergies, Med Hx, Sx Hx?  If yes, update Summary List no       TREATMENT AREA =  Neck pain [M54.2]    OBJECTIVE    Modalities Rationale:     decrease pain and increase tissue extensibility to improve patient's ability to progress to PLOF and address remaining functional goals.     min '[]'$  Estim Unattended, type/location:                                      '[]'$   w/ice    '[]'$   w/heat   8 min '[x]'$  Estim Attended, type/location: Left masseter; and SCM                                    '[x]'$   w/US     '[]'$   w/ice    '[]'$   w/heat    '[]'$   TENS insruct      min '[]'$   Mechanical Traction: type/lbs                   '[]'$   pro   '[]'$   sup   '[]'$   int   '[]'$   cont    '[]'$   before manual    '[]'$   after manual    min '[]'$   Ultrasound, settings/location:      min '[]'$   Iontophoresis w/ dexamethasone, location:                                               '[]'$   take home patch       '[]'$   in clinic        min  unbilled '[]'$   Ice     '[]'$   Heat    location/position:     min '[]'$   Paraffin,  details:     min '[]'$   Vasopneumatic Device, press/temp:     min '[]'$   Whirlpool / Fluido:    If using vaso (only need to measure limb vaso being performed on)      pre-treatment girth :       post-treatment girth :       measured at (landmark location) :      min '[]'$   Other:    Skin assessment post-treatment (if applicable):    '[]'$   intact    '[]'$   redness- no adverse reaction                 '[]'$   redness - adverse reaction:         Therapeutic Procedures:  Tx Min Billable or 1:1 Min (if diff from Tx Min)  Procedure, Rationale, Specifics   24  [id number] Therapeutic Exercise (timed):  increase ROM, strength, coordination, balance, and proprioception to improve patient's ability to progress to PLOF and address remaining functional goals. (see flow sheet as applicable)     Details if applicable:       8  [id number] Manual Therapy (timed):  decrease pain, increase ROM, and increase tissue extensibility to improve patient's ability to progress to PLOF and address remaining functional goals.  The manual therapy interventions were performed at a separate and distinct time from the therapeutic activities interventions . (see flow sheet as applicable)     Details if applicable:  left C/S UPAs grade 3; manual isometrics with mandibular opening and lateral deviation to the right          Details if applicable:            Details if applicable:            Details if applicable:     72  MC BC Totals Reminder: bill using total billable min of TIMED therapeutic procedures (example: do not include dry needle or estim unattended, both untimed codes, in totals to left)  8-22 min = 1 unit; 23-37 min = 2 units; 38-52 min = 3 units; 53-67 min = 4 units; 68-82 min = 5 units   Total Total     TOTAL TREATMENT TIME:        40     '[x]'$   Patient Education billed concurrently with other procedures   '[x]'$  Review HEP    '[]'$  Progressed/Changed HEP, detail:    '[]'$  Other detail:       Objective Information/Functional Measures/Assessment    Fairly good posture in supine with improvement noted in C/S left rotation, but posture reverts immediately upon sitting. Anticipate little carryover with patient attending therapy only 1x a week and being inconsistent with HEP.    Patient will continue to benefit from skilled PT / OT services to modify and progress therapeutic interventions, analyze and address functional mobility deficits, analyze and address ROM deficits, analyze and address strength deficits, analyze and address soft tissue restrictions, analyze and cue for  proper movement patterns, and analyze and modify for postural abnormalities to address functional deficits and attain remaining goals.    Progress toward goals / Updated goals:  '[]'$   See Progress Note/Recertification    Short Term Goals: To be accomplished in 2 weeks  1. I and compliant with HEP for self management of symptoms.   IE-issued HEP  Current: semi-compliant 06/15/22  Long Term Goals: To be accomplished in 4 weeks  1. Improve FOTO to 52 to indicate improved function with daily activities.   IE-46  2. Increase C/S B lateral flex and rotation by 5 deg to increase ease with ADLs.  IE-right lat flex 17; left lat flex 11; right rotation 44; left rot 42  3. Increase mandibular opening to 3 fingers to increase ease with eating sandwiches.  IE-2 fingers  4. Patient will report >=50% improvement to increase ease with ADLs.  IE-0%    PLAN  Yes  Continue plan of care  '[]'$   Upgrade activities as tolerated  '[]'$   Discharge due to :  '[]'$   Other:    Weston Settle, PT, CMTPT    06/15/2022    10:48 AM  Future Appointments   Date Time Provider [place]   06/15/2022  1:50 PM Weston Settle, PT [facility]   06/23/2022 11:10 AM Weston Settle, PT [facility]   07/01/2022 11:10 AM Weston Settle, PT [facility]   07/08/2022 11:10 AM Weston Settle, PT [facility]   07/09/2022  9:25 AM [facility] VISIT [facility]   07/16/2022  9:20 AM Raymund Jene Every, MD [facility]   11/19/2022 10:30 AM Clancy Gourd, MD [facility] [place]

## 2022-06-16 ENCOUNTER — Ambulatory Visit: Payer: Medicare Other

## 2022-06-17 ENCOUNTER — Ambulatory Visit: Payer: Medicare Other | Admitting: Internal Medicine

## 2022-06-18 ENCOUNTER — Telehealth (INDEPENDENT_AMBULATORY_CARE_PROVIDER_SITE_OTHER): Payer: Medicare Other | Admitting: Family Medicine

## 2022-06-18 ENCOUNTER — Encounter: Payer: Self-pay | Admitting: Family Medicine

## 2022-06-18 VITALS — Ht 66.0 in | Wt 177.0 lb

## 2022-06-18 DIAGNOSIS — R102 Pelvic and perineal pain: Secondary | ICD-10-CM

## 2022-06-18 DIAGNOSIS — R8290 Unspecified abnormal findings in urine: Secondary | ICD-10-CM

## 2022-06-18 LAB — POCT URINALYSIS DIP (CLINITEK)
Bilirubin, UA: NEGATIVE
Glucose, UA: NEGATIVE mg/dL
Ketones, POC UA: NEGATIVE mg/dL
Nitrite, UA: NEGATIVE
POC PROTEIN,UA: NEGATIVE
Spec Grav, UA: <=1.005 — AB (ref 1.010–1.025)
Urobilinogen, UA: 0.2 E.U./dL
pH, UA: 6 (ref 5.0–8.0)

## 2022-06-18 NOTE — Progress Notes (Signed)
   Virtual Visit via Telephone Note  I connected with Mia [name] on 06/18/22 at  4:20 PM EDT by telephone and verified that I am speaking with the correct person using two identifiers.   I discussed the limitations, risks, security and privacy concerns of performing an evaluation and management service by telephone and the availability of in person appointments. I also discussed with the patient that there may be a patient responsible charge related to this service. The patient expressed understanding and agreed to proceed.  Patient location: in car Provider loccation: In office   Subjective:    CC:   Chief Complaint  Patient presents with   Pelvic Pain    HPI: On Day 10 after COVID with fever, chills, and diarrhea.  Normally drinks Coke 0 but switched to water bc of COVID to stay hydrated.  Pelvic pain and pressure before urinates.  No dysuria.  Sxs started 3 days ago.  Tried some AZO.   Has been going to Neuro PT bc of worsening gait and fell recently and injured her left knee.   Past medical history, Surgical history, Family history not pertinant except as noted below, Social history, Allergies, and medications have been entered into the medical record, reviewed, and corrections made.   Review of Systems: No fevers, chills, night sweats, weight loss, chest pain, or shortness of breath.   Objective:    General: Speaking clearly in complete sentences without any shortness of breath.  Alert and oriented x3.  Normal judgment. No apparent acute distress.    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  Ok to use the AZO. Call if not better after the weekend.  Will send culture since neg for nitrates.    Fraser Din came by and gave  a UA sample   No orders of the defined types were placed in this encounter.   No orders of the defined types were placed in this encounter.    I discussed the assessment and treatment plan with the patient. The patient was provided an opportunity to ask questions and all were answered. The patient agreed with the plan and demonstrated an understanding of the instructions.   The patient was advised to call back or seek an in-person evaluation if the symptoms worsen or if the condition fails to improve as anticipated.  I provided 11 minutes of non-face-to-face time during this encounter.   [name], MD

## 2022-06-19 ENCOUNTER — Ambulatory Visit: Payer: Medicare Other

## 2022-06-20 LAB — URINE CULTURE
MICRO NUMBER:: 13831159
SPECIMEN QUALITY:: ADEQUATE

## 2022-06-22 ENCOUNTER — Ambulatory Visit (INDEPENDENT_AMBULATORY_CARE_PROVIDER_SITE_OTHER): Payer: Medicare Other | Admitting: Family Medicine

## 2022-06-22 VITALS — BP 128/70 | HR 72

## 2022-06-22 DIAGNOSIS — E538 Deficiency of other specified B group vitamins: Secondary | ICD-10-CM

## 2022-06-22 MED ORDER — CYANOCOBALAMIN 1000 MCG/ML IJ SOLN
1000.0000 ug | Freq: Once | INTRAMUSCULAR | Status: AC
  Administered 2022-06-22: 1000 ug via INTRAMUSCULAR

## 2022-06-22 NOTE — Progress Notes (Signed)
[name], urine culture is negative which is reassuring.

## 2022-06-22 NOTE — Progress Notes (Signed)
Agree with documentation as above.   Noel Rodier, MD  

## 2022-06-22 NOTE — Progress Notes (Signed)
Pt is here for a vitamin B12 injection.  Pt denies gastrointestinal problems or dizziness.  Pt tolerated injection well.  Advised to return in 4 weeks.  Charyl Bigger, CMA

## 2022-06-23 ENCOUNTER — Ambulatory Visit: Payer: Medicare Other

## 2022-06-23 ENCOUNTER — Inpatient Hospital Stay: Admit: 2022-06-23 | Payer: MEDICARE | Primary: Internal Medicine

## 2022-06-23 NOTE — Progress Notes (Signed)
PHYSICAL / OCCUPATIONAL THERAPY - DAILY TREATMENT NOTE (updated 2/23)    Patient Name: Laura Roman    Date: 06/23/2022    DOB: December 30, 1950  Insurance: Payor: MEDICARE / Plan: MEDICARE PART A AND B / Product Type: *No Product type* /      Patient DOB verified Yes     Visit #   Current / Total 3 4   Time   In / Out 1110 1153   Pain   In / Out 3 2-2.5   Subjective Functional Status/Changes: No changes. It feels the same. Reports only doing her exercises once since her last treatment.   Changes to:  Meds, Allergies, Med Hx, Sx Hx?  If yes, update Summary List no       TREATMENT AREA =  Neck pain [M54.2]    OBJECTIVE    Modalities Rationale:     decrease pain and increase tissue extensibility to improve patient's ability to progress to PLOF and address remaining functional goals.     min '[]'$  Estim Unattended, type/location:                                      '[]'$   w/ice    '[]'$   w/heat   8 min '[x]'$  Estim Attended, type/location: Left SCM, scalenes, masseter                                    '[x]'$   w/US     '[]'$   w/ice    '[]'$   w/heat    '[]'$   TENS insruct      min '[]'$   Mechanical Traction: type/lbs                   '[]'$   pro   '[]'$   sup   '[]'$   int   '[]'$   cont    '[]'$   before manual    '[]'$   after manual    min '[]'$   Ultrasound, settings/location:      min '[]'$   Iontophoresis w/ dexamethasone, location:                                               '[]'$   take home patch       '[]'$   in clinic        min  unbilled '[]'$   Ice     '[]'$   Heat    location/position:     min '[]'$   Paraffin,  details:     min '[]'$   Vasopneumatic Device, press/temp:     min '[]'$   Whirlpool / Fluido:    If using vaso (only need to measure limb vaso being performed on)      pre-treatment girth :       post-treatment girth :       measured at (landmark location) :      min '[]'$   Other:    Skin assessment post-treatment (if applicable):    '[]'$   intact    '[]'$   redness- no adverse reaction                 '[]'$ redness - adverse reaction:         Therapeutic Procedures:  Tx  Min Billable or 1:1 Min (if  diff from Tx Min) Procedure, Rationale, Specifics   27  97110 Therapeutic Exercise (timed):  increase ROM, strength, coordination, balance, and proprioception to improve patient's ability to progress to PLOF and address remaining functional goals. (see flow sheet as applicable)     Details if applicable:       8  09983 Manual Therapy (timed):  decrease pain, increase tissue extensibility, and decrease trigger points to improve patient's ability to progress to PLOF and address remaining functional goals.  The manual therapy interventions were performed at a separate and distinct time from the therapeutic activities interventions . (see flow sheet as applicable)     Details if applicable:  STM left SCM, scalenes,; left grade III C/S UPAs          Details if applicable:            Details if applicable:            Details if applicable:     67  MC BC Totals Reminder: bill using total billable min of TIMED therapeutic procedures (example: do not include dry needle or estim unattended, both untimed codes, in totals to left)  8-22 min = 1 unit; 23-37 min = 2 units; 38-52 min = 3 units; 53-67 min = 4 units; 68-82 min = 5 units   Total Total     TOTAL TREATMENT TIME:        43     '[x]'$   Patient Education billed concurrently with other procedures   '[x]'$  Review HEP    '[]'$  Progressed/Changed HEP, detail:    '[]'$  Other detail:       Objective Information/Functional Measures/Assessment    Informed patient that she would not progress in therapy if she remains non-complaint with PT and only attends 1x a week. Reported a decreased post treatment, but reported the same pain level until therapist went over the pain scale with the patient. Able to easily get 3 fingers inside mouth with mandibular opening, but complains of pain on left side.    Patient will continue to benefit from skilled PT / OT services to modify and progress therapeutic interventions, analyze and address functional mobility deficits, analyze and address ROM deficits,  analyze and address strength deficits, analyze and address soft tissue restrictions, analyze and cue for proper movement patterns, and analyze and modify for postural abnormalities to address functional deficits and attain remaining goals.    Progress toward goals / Updated goals:  '[]'$   See Progress Note/Recertification    Short Term Goals: To be accomplished in 2 weeks  1. I and compliant with HEP for self management of symptoms.   IE-issued HEP  Current: semi-compliant 06/15/22; no change 06/23/22  Long Term Goals: To be accomplished in 4 weeks  1. Improve FOTO to 52 to indicate improved function with daily activities.   IE-46  2. Increase C/S B lateral flex and rotation by 5 deg to increase ease with ADLs.  IE-right lat flex 17; left lat flex 11; right rotation 44; left rot 42  Current:  3. Increase mandibular opening to 3 fingers to increase ease with eating sandwiches.  IE-2 fingers  4. Patient will report >=50% improvement to increase ease with ADLs.  IE-0%    PLAN  Yes  Continue plan of care  '[]'$   Upgrade activities as tolerated  '[]'$   Discharge due to :  '[]'$   Other:    Weston Settle, PT, CMTPT  06/23/2022    9:04 AM    Future Appointments   Date Time Provider [place]   06/23/2022 11:10 AM Weston Settle, PT [facility]   07/01/2022 11:10 AM Weston Settle, PT [facility]   07/08/2022 11:10 AM Weston Settle, PT [facility]   07/09/2022  9:30 AM [facility] V[facility]   07/16/2022  9:20 AM Raymund Jene Every, MD [facility]   11/19/2022 10:30 AM Clancy Gourd, MD [facility]

## 2022-06-24 ENCOUNTER — Other Ambulatory Visit: Payer: Self-pay | Admitting: *Deleted

## 2022-06-24 DIAGNOSIS — Z79899 Other long term (current) drug therapy: Secondary | ICD-10-CM | POA: Diagnosis not present

## 2022-06-25 DIAGNOSIS — H16223 Keratoconjunctivitis sicca, not specified as Sjogren's, bilateral: Secondary | ICD-10-CM | POA: Diagnosis not present

## 2022-06-25 DIAGNOSIS — H35371 Puckering of macula, right eye: Secondary | ICD-10-CM | POA: Diagnosis not present

## 2022-06-25 LAB — CBC WITH DIFFERENTIAL/PLATELET
Absolute Monocytes: 891 cells/uL (ref 200–950)
Basophils Absolute: 61 cells/uL (ref 0–200)
Basophils Relative: 0.9 %
Eosinophils Absolute: 163 cells/uL (ref 15–500)
Eosinophils Relative: 2.4 %
HCT: 35.2 % (ref 35.0–45.0)
Hemoglobin: 11.4 g/dL — ABNORMAL LOW (ref 11.7–15.5)
Lymphs Abs: 2060 cells/uL (ref 850–3900)
MCH: 28.8 pg (ref 27.0–33.0)
MCHC: 32.4 g/dL (ref 32.0–36.0)
MCV: 88.9 fL (ref 80.0–100.0)
MPV: 9.6 fL (ref 7.5–12.5)
Monocytes Relative: 13.1 %
Neutro Abs: 3624 cells/uL (ref 1500–7800)
Neutrophils Relative %: 53.3 %
Platelets: 387 10*3/uL (ref 140–400)
RBC: 3.96 10*6/uL (ref 3.80–5.10)
RDW: 13.8 % (ref 11.0–15.0)
Total Lymphocyte: 30.3 %
WBC: 6.8 10*3/uL (ref 3.8–10.8)

## 2022-06-25 LAB — COMPLETE METABOLIC PANEL WITH GFR
AG Ratio: 1.4 (calc) (ref 1.0–2.5)
ALT: 29 U/L (ref 6–29)
AST: 29 U/L (ref 10–35)
Albumin: 3.7 g/dL (ref 3.6–5.1)
Alkaline phosphatase (APISO): 111 U/L (ref 37–153)
BUN: 9 mg/dL (ref 7–25)
CO2: 29 mmol/L (ref 20–32)
Calcium: 9.2 mg/dL (ref 8.6–10.4)
Chloride: 95 mmol/L — ABNORMAL LOW (ref 98–110)
Creat: 0.8 mg/dL (ref 0.60–1.00)
Globulin: 2.6 g/dL (calc) (ref 1.9–3.7)
Glucose, Bld: 91 mg/dL (ref 65–99)
Potassium: 5 mmol/L (ref 3.5–5.3)
Sodium: 132 mmol/L — ABNORMAL LOW (ref 135–146)
Total Bilirubin: 0.3 mg/dL (ref 0.2–1.2)
Total Protein: 6.3 g/dL (ref 6.1–8.1)
eGFR: 79 mL/min/{1.73_m2} (ref >=60)

## 2022-06-25 NOTE — Progress Notes (Signed)
CBC and CMP are stable.  Hemoglobin is low.  Please advise patient to take multivitamin with iron.

## 2022-06-26 ENCOUNTER — Ambulatory Visit: Payer: Medicare Other

## 2022-06-29 ENCOUNTER — Other Ambulatory Visit: Payer: Self-pay | Admitting: Cardiovascular Disease

## 2022-06-30 ENCOUNTER — Ambulatory Visit: Payer: Medicare Other

## 2022-07-01 ENCOUNTER — Inpatient Hospital Stay: Admit: 2022-07-01 | Payer: MEDICARE | Primary: Internal Medicine

## 2022-07-01 NOTE — Progress Notes (Addendum)
[address] [place] [address] - Ph: [phone]   Fx: [phone]    PHYSICAL THERAPY PROGRESS NOTE      Patient name: Neela Zecca Seymore Start of Care: 06/04/22   Referral source: Albin Felling, MD DOB: 1951/06/21    Medical Diagnosis: Neck pain [M54.2]  Payor: MEDICARE / Plan: MEDICARE PART A AND B / Product Type: *No Product type* /  Onset Date:March 2023    Treatment Diagnosis: M54.2  NECK PAIN                                     Prior Hospitalization: see medical history Provider#: [id number]   Medications: Verified on Patient summary List   Comorbidities: Breast CA with radiation; allergies; anxiety; DM; neuropathy  Prior Level of Function: Able to eat all food without pain  Visits from Start of Care: 4    Missed Visits: 0    Goals/Measure of Progress: To be achieved in 4 weeks:    1. I and compliant with HEP for self management of symptoms.   IE-issued HEP  PN: reports compliance 07/01/22  2. Improve FOTO to 52 to indicate improved function with daily activities.   IE-46  PN:47  3. Increase C/S B lateral flex and rotation by 5 deg to increase ease with ADLs.  IE-right lat flex 17; left lat flex 11; right rotation 44; left rot 42  BZ:JIRCV lat flex 18; 15 deg; right rot 49 deg; 48 left rot- partially met  4. Increase mandibular opening to 3 fingers to increase ease with eating sandwiches.  IE-2 fingers  PN:2.5 fingers- progressing  5. Patient will report >=50% improvement to increase ease with ADLs.  IE-0%  PN:30%    Summary of Care/ Key Functional Changes:    Overall, neck and jaw mobility is improving. Patient is able to fit 3 fingers into mouth; however experiences pain at end-range. Patient will benefit from continuing with PT to further improve neck and jaw mobility and improve scapular strength/stability.       ASSESSMENT/RECOMMENDATIONS:    Continue per plan of care. 1x a week x 4 weeks    Thank you for this referral.   Weston Settle, PT 07/01/2022 11:41 AM

## 2022-07-01 NOTE — Progress Notes (Signed)
PHYSICAL / OCCUPATIONAL THERAPY - DAILY TREATMENT NOTE (updated 2/23)    Patient Name: Laura Roman    Date: 07/01/2022    DOB: 03/05/1951  Insurance: Payor: MEDICARE / Plan: MEDICARE PART A AND B / Product Type: *No Product type* /      Patient DOB verified Yes     Visit #   Current / Total 4 4   Time   In / Out 1114 1205   Pain   In / Out 2 0   Subjective Functional Status/Changes: I think I'm getting better. Sometimes it hurts and sometimes it doesn't.    Changes to:  Meds, Allergies, Med Hx, Sx Hx?  If yes, update Summary List no       TREATMENT AREA =  Neck pain [M54.2]    OBJECTIVE    Modalities Rationale:     decrease pain and increase tissue extensibility to improve patient's ability to progress to PLOF and address remaining functional goals.     min '[]'  Estim Unattended, type/location:                                      '[]'   w/ice    '[]'   w/heat   8 min '[x]'  Estim Attended, type/location: Left masseter/ SCM                                    '[x]'   w/US     '[]'   w/ice    '[]'   w/heat    '[]'   TENS insruct      min '[]'   Mechanical Traction: type/lbs                   '[]'   pro   '[]'   sup   '[]'   int   '[]'   cont    '[]'   before manual    '[]'   after manual    min '[]'   Ultrasound, settings/location:      min '[]'   Iontophoresis w/ dexamethasone, location:                                               '[]'   take home patch       '[]'   in clinic        min  unbilled '[]'   Ice     '[]'   Heat    location/position:     min '[]'   Paraffin,  details:     min '[]'   Vasopneumatic Device, press/temp:     min '[]'   Whirlpool / Fluido:    If using vaso (only need to measure limb vaso being performed on)      pre-treatment girth :       post-treatment girth :       measured at (landmark location) :      min '[]'   Other:    Skin assessment post-treatment (if applicable):    '[]'   intact    '[]'   redness- no adverse reaction                 '[]' redness - adverse reaction:         Therapeutic Procedures:  Tx Min Billable or 1:1  Min (if diff from Tx Min) Procedure,  Rationale, Specifics   35  97110 Therapeutic Exercise (timed):  increase ROM, strength, coordination, balance, and proprioception to improve patient's ability to progress to PLOF and address remaining functional goals. (see flow sheet as applicable)     Details if applicable:       8  26834 Manual Therapy (timed):  reassessment of goals to improve patient's ability to progress to PLOF and address remaining functional goals.  The manual therapy interventions were performed at a separate and distinct time from the therapeutic activities interventions . (see flow sheet as applicable)     Details if applicable:            Details if applicable:            Details if applicable:            Details if applicable:     80  MC BC Totals Reminder: bill using total billable min of TIMED therapeutic procedures (example: do not include dry needle or estim unattended, both untimed codes, in totals to left)  8-22 min = 1 unit; 23-37 min = 2 units; 38-52 min = 3 units; 53-67 min = 4 units; 68-82 min = 5 units   Total Total     TOTAL TREATMENT TIME:        51     '[x]'   Patient Education billed concurrently with other procedures   '[x]'  Review HEP    '[]'  Progressed/Changed HEP, detail:    '[]'  Other detail:       Objective Information/Functional Measures/Assessment    Overall, neck and jaw mobility is improving. Patient is able to fit 3 fingers into mouth; however experiences pain at end-range. Patient will benefit from continuing with PT to further improve neck and jaw mobility and improve scapular strength/stability.     Patient will continue to benefit from skilled PT / OT services to modify and progress therapeutic interventions, analyze and address functional mobility deficits, analyze and address ROM deficits, analyze and address strength deficits, analyze and address soft tissue restrictions, analyze and cue for proper movement patterns, and analyze and modify for postural abnormalities to address functional deficits and attain  remaining goals.    Progress toward goals / Updated goals:  '[x]'   See Progress Note/Recertification    Short Term Goals: To be accomplished in 2 weeks  1. I and compliant with HEP for self management of symptoms.   IE-issued HEP  Current: reports compliance 07/01/22  Long Term Goals: To be accomplished in 4 weeks  1. Improve FOTO to 52 to indicate improved function with daily activities.   IE-46  Current:47  2. Increase C/S B lateral flex and rotation by 5 deg to increase ease with ADLs.  IE-right lat flex 17; left lat flex 11; right rotation 44; left rot 42  Current:right lat flex 18; 15 deg; right rot 49 deg; 48 left rot- partially met  3. Increase mandibular opening to 3 fingers to increase ease with eating sandwiches.  IE-2 fingers  Current:2.5 fingers- progressing  4. Patient will report >=50% improvement to increase ease with ADLs.  IE-0%  Current:30%  PLAN  Yes  Continue plan of care  '[]'   Upgrade activities as tolerated  '[]'   Discharge due to :  '[]'   Other:    Weston Settle, PT, CMTPT    07/01/2022    9:47 AM    Future Appointments   Date Time Provider [place]  07/01/2022 11:10 AM Weston Settle, PT [facility]   07/08/2022 11:10 AM Weston Settle, PT [facility]   07/09/2022  9:30 AM [facility]   07/16/2022  9:20 AM Raymund Jene Every, MD [facility]   11/19/2022 10:30 AM Clancy Gourd, MD [facility]

## 2022-07-02 DIAGNOSIS — K219 Gastro-esophageal reflux disease without esophagitis: Secondary | ICD-10-CM | POA: Diagnosis not present

## 2022-07-02 DIAGNOSIS — D509 Iron deficiency anemia, unspecified: Secondary | ICD-10-CM | POA: Diagnosis not present

## 2022-07-02 DIAGNOSIS — K580 Irritable bowel syndrome with diarrhea: Secondary | ICD-10-CM | POA: Diagnosis not present

## 2022-07-02 DIAGNOSIS — Z1231 Encounter for screening mammogram for malignant neoplasm of breast: Secondary | ICD-10-CM | POA: Diagnosis not present

## 2022-07-02 DIAGNOSIS — R7989 Other specified abnormal findings of blood chemistry: Secondary | ICD-10-CM | POA: Diagnosis not present

## 2022-07-02 LAB — HM MAMMOGRAPHY

## 2022-07-03 ENCOUNTER — Ambulatory Visit: Payer: Medicare Other

## 2022-07-07 ENCOUNTER — Ambulatory Visit: Payer: Medicare Other

## 2022-07-08 ENCOUNTER — Inpatient Hospital Stay: Admit: 2022-07-08 | Payer: MEDICARE | Primary: Internal Medicine

## 2022-07-08 NOTE — Progress Notes (Signed)
PHYSICAL / OCCUPATIONAL THERAPY - DAILY TREATMENT NOTE (updated 2/23)    Patient Name: Laura Roman    Date: 07/08/2022    DOB: [birth year]  Insurance: Payor: MEDICARE / Plan: MEDICARE PART A AND B / Product Type: *No Product type* /      Patient DOB verified Yes     Visit #   Current / Total 5 8   Time   In / Out 1114 1152   Pain   In / Out 2 2   Subjective Functional Status/Changes: It comes and goes. I'm starting to think that I'll just have to live with this.    Changes to:  Meds, Allergies, Med Hx, Sx Hx?  If yes, update Summary List no       TREATMENT AREA =  Neck pain [M54.2]    OBJECTIVE    Modalities Rationale:     decrease pain and increase tissue extensibility to improve patient's ability to progress to PLOF and address remaining functional goals.     min [] Estim Unattended, type/location:                                      []  w/ice    []  w/heat   8 min [x] Estim Attended, type/location: Left masseter                                    [x]  w/US     []  w/ice    []  w/heat    []  TENS insruct      min []  Mechanical Traction: type/lbs                   []  pro   []  sup   []  int   []  cont    []  before manual    []  after manual    min []  Ultrasound, settings/location:      min []  Iontophoresis w/ dexamethasone, location:                                               []  take home patch       []  in clinic        min  unbilled []  Ice     []  Heat    location/position:     min []  Paraffin,  details:     min []  Vasopneumatic Device, press/temp:     min []  Whirlpool / Fluido:    If using vaso (only need to measure limb vaso being performed on)      pre-treatment girth :       post-treatment girth :       measured at (landmark location) :      min []  Other:    Skin assessment post-treatment (if applicable):    []  intact    []  redness- no adverse reaction                 []redness - adverse reaction:         Therapeutic Procedures:  Tx Min  Billable or 1:1 Min (if diff from Tx Min) Procedure,  Rationale, Specifics   22  97110 Therapeutic Exercise (timed):  increase ROM, strength, coordination, balance, and proprioception to improve patient's ability to progress to PLOF and address remaining functional goals. (see flow sheet as applicable)     Details if applicable:       8  84665 Neuromuscular Re-Education (timed):  improve balance, coordination, kinesthetic sense, posture, core stability and proprioception to improve patient's ability to develop conscious control of individual muscles and awareness of position of extremities in order to progress to PLOF and address remaining functional goals. (see flow sheet as applicable)     Details if applicable:            Details if applicable:            Details if applicable:            Details if applicable:     26  MC BC Totals Reminder: bill using total billable min of TIMED therapeutic procedures (example: do not include dry needle or estim unattended, both untimed codes, in totals to left)  8-22 min = 1 unit; 23-37 min = 2 units; 38-52 min = 3 units; 53-67 min = 4 units; 68-82 min = 5 units   Total Total     TOTAL TREATMENT TIME:        38     [x]  Patient Education billed concurrently with other procedures   [x] Review HEP    [] Progressed/Changed HEP, detail:    [] Other detail:       Objective Information/Functional Measures/Assessment    Encourage patient to perform mandibular exercises in front of the mirror to observe any deviation. TC with some exercises as patient arrived 78' late.     Patient will continue to benefit from skilled PT / OT services to modify and progress therapeutic interventions, analyze and address functional mobility deficits, analyze and address ROM deficits, analyze and address strength deficits, analyze and address soft tissue restrictions, analyze and cue for proper movement patterns, and analyze and modify for postural abnormalities to address functional deficits and attain remaining goals.    Progress toward goals / Updated  goals:  []  See Progress Note/Recertification    1. I and compliant with HEP for self management of symptoms.   IE-issued HEP  PN: reports compliance 07/01/22  2. Improve FOTO to 52 to indicate improved function with daily activities.   IE-46  PN:47  3. Increase C/S B lateral flex and rotation by 5 deg to increase ease with ADLs.  IE-right lat flex 17; left lat flex 11; right rotation 44; left rot 42  LD:JTTSV lat flex 18; 15 deg; right rot 49 deg; 48 left rot- partially met  4. Increase mandibular opening to 3 fingers to increase ease with eating sandwiches.  IE-2 fingers  PN:2.5 fingers- progressing  5. Patient will report >=50% improvement to increase ease with ADLs.  IE-0%  PN:30%    PLAN  Yes  Continue plan of care  []  Upgrade activities as tolerated  []  Discharge due to :  []  Other:    Weston Settle, PT, CMTPT    07/08/2022    9:28 AM    Future Appointments   Date Time Provider [place]   07/08/2022 11:10 AM Weston Settle, PT [facility]   07/09/2022  9:30 AM [facility] VISIT [facility]   07/16/2022  9:20 AM  Albin Felling, MD [facility]   07/17/2022  9:50 AM Weston Settle, PT [facility]   07/21/2022 11:50 AM Weston Settle, PT [facility]   07/27/2022 11:10 AM Weston Settle, PT [facility]   11/19/2022 10:30 AM Clancy Gourd, MD [facility] [place]

## 2022-07-09 ENCOUNTER — Ambulatory Visit: Admit: 2022-07-09 | Discharge: 2022-07-09 | Payer: MEDICARE | Primary: Internal Medicine

## 2022-07-09 ENCOUNTER — Inpatient Hospital Stay: Admit: 2022-07-09 | Payer: MEDICARE | Primary: Internal Medicine

## 2022-07-09 DIAGNOSIS — E785 Hyperlipidemia, unspecified: Secondary | ICD-10-CM

## 2022-07-09 DIAGNOSIS — K148 Other diseases of tongue: Secondary | ICD-10-CM | POA: Diagnosis not present

## 2022-07-09 LAB — COMPREHENSIVE METABOLIC PANEL
ALT: 24 U/L (ref 13–56)
AST: 17 U/L (ref 10–38)
Albumin/Globulin Ratio: 1 (ref 0.8–1.7)
Albumin: 3.3 g/dL — ABNORMAL LOW (ref 3.4–5.0)
Alk Phosphatase: 89 U/L (ref 45–117)
Anion Gap: 4 mmol/L (ref 3.0–18)
BUN: 8 MG/DL (ref 7.0–18)
Bun/Cre Ratio: 11 — ABNORMAL LOW (ref 12–20)
CO2: 29 mmol/L (ref 21–32)
Calcium: 8.9 MG/DL (ref 8.5–10.1)
Chloride: 106 mmol/L (ref 100–111)
Creatinine: 0.71 MG/DL (ref 0.6–1.3)
Est, Glom Filt Rate: >60 mL/min/{1.73_m2} (ref >60)
Globulin: 3.4 g/dL (ref 2.0–4.0)
Glucose: 128 mg/dL — ABNORMAL HIGH (ref 74–99)
Potassium: 4.3 mmol/L (ref 3.5–5.5)
Sodium: 139 mmol/L (ref 136–145)
Total Bilirubin: 0.4 MG/DL (ref 0.2–1.0)
Total Protein: 6.7 g/dL (ref 6.4–8.2)

## 2022-07-09 LAB — CBC WITH AUTO DIFFERENTIAL
Absolute Immature Granulocyte: 0 10*3/uL (ref 0.00–0.04)
Basophils %: 1 % (ref 0–2)
Basophils Absolute: 0 10*3/uL (ref 0.0–0.1)
Eosinophils %: 1 % (ref 0–5)
Eosinophils Absolute: 0.1 10*3/uL (ref 0.0–0.4)
Hematocrit: 39.1 % (ref 35.0–45.0)
Hemoglobin: 12.2 g/dL (ref 12.0–16.0)
Immature Granulocytes: 0 % (ref 0.0–0.5)
Lymphocytes %: 16 % — ABNORMAL LOW (ref 21–52)
Lymphocytes Absolute: 1 10*3/uL (ref 0.9–3.6)
MCH: 30 PG (ref 24.0–34.0)
MCHC: 31.2 g/dL (ref 31.0–37.0)
MCV: 96.1 FL (ref 78.0–100.0)
MPV: 9.6 FL (ref 9.2–11.8)
Monocytes %: 8 % (ref 3–10)
Monocytes Absolute: 0.5 10*3/uL (ref 0.05–1.2)
Neutrophils %: 74 % — ABNORMAL HIGH (ref 40–73)
Neutrophils Absolute: 4.8 10*3/uL (ref 1.8–8.0)
Nucleated RBCs: 0 PER 100 WBC
Platelets: 196 10*3/uL (ref 135–420)
RBC: 4.07 M/uL — ABNORMAL LOW (ref 4.20–5.30)
RDW: 13.3 % (ref 11.6–14.5)
WBC: 6.4 10*3/uL (ref 4.6–13.2)
nRBC: 0 10*3/uL (ref 0.00–0.01)

## 2022-07-09 LAB — LIPID PANEL
Chol/HDL Ratio: 2.7 (ref 0–5.0)
Cholesterol, Total: 120 MG/DL (ref <200)
HDL: 44 MG/DL (ref 40–60)
LDL Calculated: 46.2 MG/DL (ref 0–100)
Triglycerides: 149 MG/DL (ref <150)
VLDL Cholesterol Calculated: 29.8 MG/DL

## 2022-07-09 LAB — HEMOGLOBIN A1C W/O EAG: Hemoglobin A1C: 5.8 % — ABNORMAL HIGH (ref 4.2–5.6)

## 2022-07-10 ENCOUNTER — Ambulatory Visit: Payer: Medicare Other

## 2022-07-10 LAB — VITAMIN D 25 HYDROXY: Vit D, 25-Hydroxy: 67.6 ng/mL (ref 30–100)

## 2022-07-11 ENCOUNTER — Encounter: Payer: Self-pay | Admitting: Family Medicine

## 2022-07-14 ENCOUNTER — Ambulatory Visit: Payer: Medicare Other

## 2022-07-16 ENCOUNTER — Telehealth: Payer: Self-pay | Admitting: Adult Health

## 2022-07-16 ENCOUNTER — Inpatient Hospital Stay: Admit: 2022-07-16 | Payer: MEDICARE | Primary: Internal Medicine

## 2022-07-16 ENCOUNTER — Ambulatory Visit: Admit: 2022-07-16 | Discharge: 2022-07-16 | Payer: MEDICARE | Attending: Internal Medicine | Primary: Internal Medicine

## 2022-07-16 DIAGNOSIS — E119 Type 2 diabetes mellitus without complications: Secondary | ICD-10-CM

## 2022-07-16 LAB — MICROALBUMIN / CREATININE URINE RATIO
Creatinine, Ur: 49 mg/dL (ref 30–125)
Microalbumin Creatinine Ratio: 23 mg/g (ref 0–30)
Microalbumin, Random Urine: 1.12 MG/DL (ref 0–3.0)

## 2022-07-16 MED ORDER — GABAPENTIN 100 MG PO CAPS
100 MG | ORAL_CAPSULE | Freq: Two times a day (BID) | ORAL | 5 refills | Status: DC
Start: 2022-07-16 — End: 2023-08-10

## 2022-07-16 NOTE — Telephone Encounter (Signed)
Pt has called to report that the Neuro PT that she was supposed to start she tore her miniscus the Sunday before.  Pt has been told that no PT would be signed off on until her knee was better.  Pt states she is being told now that nothing can be done before her balance is better.  Pt is asking for a call to discuss the PT

## 2022-07-16 NOTE — Progress Notes (Signed)
71 y.o. female who presents for evaluation.    She finished up the chemoradiation and is seeing Dr Mariea Clonts and Dr Sula Rumple.  The neuropathy is controlled by taking the gaba at night only but might be interested in taking lower doses in the daytime as she has vague more upper arm nerve sx that bother her occasionally which they feel could be a sequellae of the surgery.  She also has intermittent 'side cramps' that get triggered when she is twisting to the right.  Follow up mammo was fine    Denied any cardiovascular complaints. Not much exercise as she has her mom and husband to help take care of plus chores, cooking, etc.     No gi or gu issues .  Seeing Dr Sheral Flow yearly for the endomet ca    Denies polyuria, polydipsia, nocturia, vision change.     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 Marisa Hua    DM (diabetes mellitus) ([place]) 10/2017    on basis of fbs>125; intol metformin    Dyslipidemia     Endometrial cancer ([place]) 02/2017    Dr Sheral Flow; stage 1B gr 1 endometroid adenoca w foci squamous diff of the endometrium; RA TLH BSO PLND, 0/13LN, MSI intact    Frozen shoulder     right Dr. 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 Synetta Shadow, Dr. Marisa Hua    Osteopenia     DEXA -0.5 spine, -0.8 hip (1/18); 0.5 spine, -1.3 hip (3/22)    Overweight (BMI 25.0-29.9)     IF 7/18 start weight 168 lbs not doing     Painless hematuria 04/2016    Dr Jimmye Norman, neg eval    Palpitations 2008    neg thallium 2008, nl holter 2008, echo nl lv/ef 65%/tr mr/dd/nl pasp    Syncope     neurocardiogenic by tilt 1994    TMJ syndrome 2023    LEFT after [place] tooth surgery    Venous insufficiency     Vitamin D deficiency      Past Surgical History:   Procedure Laterality Date    BREAST LUMPECTOMY  07/2020    and LN dissection Dr Mariea Clonts    COLONOSCOPY      Dr Rosendo Gros (2007) neg; (04/22/18) neg    HEMORRHOID SURGERY      Dr. Rosendo Gros 2007    HYSTERECTOMY (CERVIX STATUS UNKNOWN)  03/11/2017  Dr Sheral Flow    TUBAL LIGATION      UROLOGICAL SURGERY  06/2016    Dr Jimmye Norman; bladder bx showed benign lesions    US BREAST BIOPSY W LOC DEVICE 1ST LESION LEFT Left 02/21/2020    US BREAST NEEDLE BIOPSY LEFT 02/21/2020 [facility] [place]     Social History     Socioeconomic History    Marital status: Married     Spouse name: Not on file    Number of children: 2    Years of education: Not on file    Highest education level: Not on file   Occupational History    Occupation: ret benefits specialist   Tobacco Use    Smoking status: Never    Smokeless tobacco: Never   Vaping Use    Vaping Use: Never used   Substance and Sexual Activity    Alcohol use: Yes     Alcohol/week: 4.0 standard drinks of alcohol    Drug use: No    Sexual activity: Not on file   Other Topics Concern    Not on file   Social History Narrative    Not on file     Social Determinants of Health     Financial Resource Strain: Low Risk  (07/16/2022)    Overall Financial Resource Strain (CARDIA)     Difficulty of Paying Living Expenses: Not hard at all   Food Insecurity: No Food Insecurity (07/16/2022)    Hunger Vital Sign     Worried About Running Out of Food in the  Last Year: Never true     Ran Out of Food in the Last Year: Never true   Transportation Needs: Unknown (07/16/2022)    PRAPARE - Armed forces logistics/support/administrative officer (Medical): Not on file     Lack of Transportation (Non-Medical): No   Physical Activity: Insufficiently Active (01/13/2022)    Exercise Vital Sign     Days of Exercise per Week: 3 days     Minutes of Exercise per Session: 20 min   Stress: Not on file   Social Connections: Not on file   Intimate Partner Violence: Not on file   Housing Stability: Unknown (07/16/2022)    Housing Stability Vital Sign     Unable to Pay for Housing in the Last Year: Not on file     Number of [place] in the Last Year: Not on file     Unstable Housing in the Last Year: No     Current Outpatient Medications   Medication Sig    gabapentin (NEURONTIN) 100 MG capsule Take 3 capsules by mouth in the morning and at bedtime for 180 days. Intended supply: 30 days Max Daily Amount: 600 mg    JANUVIA 100 MG tablet take 1 tablet by mouth once daily    citalopram (CELEXA) 10 MG tablet take 1 tablet by mouth once daily    Cholecalciferol (VITAMIN D3) 1.25 MG (50000 UT) CAPS Take 1 capsule by mouth once a week    Lancets MISC Use daily as directed    aspirin 81 MG chewable tablet Take 1 tablet by mouth daily    atorvastatin (LIPITOR) 40 MG tablet take 1 tablet by mouth once daily    lansoprazole (PREVACID) 30 MG delayed release capsule take 1 capsule by mouth BEFORE BREAKFAST DAILY     No current facility-administered medications for this visit.     Allergies   Allergen Reactions    Fish Oil Nausea  Only     Patient denies  Other reaction(s): gi distress  Patient denies    Lidocaine Other (See Comments)     anxious    Metformin Diarrhea    Nabumetone Nausea Only    Thimerosal (Thiomersal) Hives     REVIEW OF SYSTEMS:  mammo 4/23, colo 6/19 Dr Rosendo Gros, DEXA 1/18 Dr Marisa Hua      Vitals:    07/16/22 0931   BP: 117/73   Pulse: 94   Resp: 16   Temp: 98.2 F (36.8 C)   TempSrc: Temporal    SpO2: 92%   Weight: 162 lb (73.5 kg)   Height: '5\' 4"'$  (1.626 m)   Affect is appropriate.  Mood stable  No apparent distress  HEENT --Anicteric sclerae.  No JVD, or bruits.  Thyroid fullness on left  Lungs --Clear to auscultation and percussion, normal percussion.  Heart --Regular rate and rhythm, no murmurs, rubs, gallops, or clicks.  Abdomen -- Soft and nontender, no hepatosplenomegaly or masses.  Extremities -- Without cyanosis, clubbing, tr ankle edema notd. 2+ pulses equally and bilaterally.  Varicose veins bilat    LABS  From 5/10 showed   gluc 110, cr 0.70,               alt 23,                                   chol 154, tg 143, hdl 45, ldl-c 80,                                                            tsh 1.91  From 5/10 showed                       2 hr GTT 89  From 8/10 showed                                                                vit d 23.0, ck 57, aldo 5.4  From 8/11 showed   gluc 108,                                     hba1c 6.3,                   chol 160, tg 172, hdl 39, ldl-c 87,  wbc 5.,7 hb 12.3, plt 235, ua neg,     tsh 2.28  From 5/12 showed   gluc 113, cr 0.71, gfr 94,  alt 16, hba1c 6.2, ldl-p 1989, chol 177, tg 161, hdl 43, ldl-c 102  From 11/12 showed                                                     hba1c 5.9,  ldl-p 1218, chol 133, tg 116, hdl 45, ldl-c 65  From 5/13 showed   gluc 105, cr 0.55, gfr 102, alt 8,  hba1c 6.2,                   chol 148, tg 107, hdl 45, ldl-c 82,  wbc 5.0, hb 12.5, plt 172, vit d 40.3  From 11/13 showed         hba1c 6.2, ldl-p 1888, chol 176, tg 155, hdl 49, ldl-c 86,  wbc 6.2, hb 12.3, plt 182, vit d 34.4  From 5/14 showed         hba1c 6.2,      chol 152, tg 157, hdl 39, ldl-c 82  From 1/15 showed   gluc 113, cr 0.68, gfr>60, alt 11, hba1c 6.2,      chol 146, tg 130, hdl 43, ldl-c 77  From 7/15 showed         hba1c 6.3,      chol 133, tg 124, hdl 39, ldl-c 69,  wbc 6.3, hb 12.2, plt 193  From 6/16 showed   gluc 106, cr 0.74, gfr>60, alt 26, hba1c  6.3,      chol 126, tg 125, hdl 46, ldl-c 55,  wbc 5.7, hb 13.2, plt 203, vit d 7.2,   tsh 1.69  From 12/16 showed gluc 110, cr 0.68, gfr>60, alt 30, hba1c 6.1,      chol 135, tg 147, hdl 47, ldl-c 59,  wbc 6.0, hb 12.7, plt 197  From 6/17 showed   gluc 122, cr 0.71, gfr>60, alt 33, hba1c 6.2,      chol 153, tg 140, hdl 46, ldl-c 79,  wbc 6.3, hb 13.1, plt 190,           tsh 1.92, hep c-  From 1/18 showed         hba1c 6.2,     chol 154, tg 181, hdl 41, ldl-c 77  From 7/18 showed   gluc 129, cr 0.63, gfr>60, alt 26, hba1c 6.1,                 wbc 6.0, hb 12.4, plt 193, vit d 79  From 1/19 showed   gluc 132, cr 0.83, gfr>60,     hba1c 6.1  From 4/19 showed         hba1c 7.0, umar 12 ,   chol 124, tg 109, hdl 46, ldl-c 56,                    vit d 56.1  From 7/19 showed   gluc 106, cr 0.67, gfr>60, alt 25, hba1c 6.0,                 wbc 5.8, hb 13.0, plt 183  From 1/20 showed        hba1c 6.0, umar 17,    chol 130, tg 113, hdl 46, ldl-c 61  From 8/20 showed   gluc 103, cr 0.73, gfr>60, alt 27, hba1c 5.7,       chol 124, tg 150, hdl 44, ldl-c 50,  wbc 5.0, hb13.0, plt 190  Form 2/21 showed         hba1c 5.9, umar na,             vit d 88.9  From 8/21 showed   gluc 109, cr 0.68, gfr>60, alt 35, hba1c 5.5,       chol 156, tg 142, hdl 51, ldl-c  77, wbc 4.2, hb 9.9,   plt 152, vit d 34.8  From 3/22 showed   gluc 100, cr 0.68, gfr>60, alt 22, hba1c 5.7,       chol 129, tg 137, hdl 48, ldl-c 54  From 9/22 showed   gluc 110, cr 0.72, gfr>60, alt 24, hba1c 5.8,       chol 128, tg 129, hdl 44, ldl-c 58, wbc 5.6, hb 12.0, plt 184  From 3/23 showed   gluc 115, cr 0.65, gfr>60,     hba1c 5.7,               vit d 93.3    Results for orders placed or performed during the hospital encounter of 07/09/22 (from the past 2160 hour(s))   Lipid Panel   Result Value Ref Range    LIPID PANEL        Cholesterol, Total 120 <200 MG/DL    Triglycerides 149 <150 MG/DL    HDL 44 40 - 60 MG/DL    LDL Calculated 46.2 0 - 100 MG/DL    VLDL  Cholesterol Calculated 29.8 MG/DL    Chol/HDL Ratio 2.7 0 - 5.0     Comprehensive Metabolic Panel   Result Value Ref Range    Sodium 139 136 - 145 mmol/L    Potassium 4.3 3.5 - 5.5 mmol/L    Chloride 106 100 - 111 mmol/L    CO2 29 21 - 32 mmol/L    Anion Gap 4 3.0 - 18 mmol/L    Glucose 128 (H) 74 - 99 mg/dL    BUN 8 7.0 - 18 MG/DL    Creatinine 0.71 0.6 - 1.3 MG/DL    Bun/Cre Ratio 11 (L) 12 - 20      Est, Glom Filt Rate >60 >60 ml/min/1.70m    Calcium 8.9 8.5 - 10.1 MG/DL    Total Bilirubin 0.4 0.2 - 1.0 MG/DL    ALT 24 13 - 56 U/L    AST 17 10 - 38 U/L    Alk Phosphatase 89 45 - 117 U/L    Total Protein 6.7 6.4 - 8.2 g/dL    Albumin 3.3 (L) 3.4 - 5.0 g/dL    Globulin 3.4 2.0 - 4.0 g/dL    Albumin/Globulin Ratio 1.0 0.8 - 1.7     CBC with Auto Differential   Result Value Ref Range    WBC 6.4 4.6 - 13.2 K/uL    RBC 4.07 (L) 4.20 - 5.30 M/uL    Hemoglobin 12.2 12.0 - 16.0 g/dL    Hematocrit 39.1 35.0 - 45.0 %    MCV 96.1 78.0 - 100.0 FL    MCH 30.0 24.0 - 34.0 PG    MCHC 31.2 31.0 - 37.0 g/dL    RDW 13.3 11.6 - 14.5 %    Platelets 196 135 - 420 K/uL    MPV 9.6 9.2 - 11.8 FL    Nucleated RBCs 0.0 0 PER 100 WBC    nRBC 0.00 0.00 - 0.01 K/uL    Neutrophils % 74 (H) 40 - 73 %    Lymphocytes % 16 (L) 21 - 52 %    Monocytes % 8 3 - 10 %    Eosinophils % 1 0 - 5 %    Basophils % 1 0 - 2 %    Immature Granulocytes 0 0.0 - 0.5 %    Neutrophils Absolute 4.8 1.8 - 8.0 K/UL    Lymphocytes Absolute 1.0 0.9 - 3.6  K/UL    Monocytes Absolute 0.5 0.05 - 1.2 K/UL    Eosinophils Absolute 0.1 0.0 - 0.4 K/UL    Basophils Absolute 0.0 0.0 - 0.1 K/UL    Absolute Immature Granulocyte 0.0 0.00 - 0.04 K/UL    Differential Type AUTOMATED     Vitamin D 25 Hydroxy   Result Value Ref Range    Vit D, 25-Hydroxy 67.6 30 - 100 ng/mL   HEMOGLOBIN A1C W/O EAG   Result Value Ref Range    Hemoglobin A1C 5.8 (H) 4.2 - 5.6 %     We reviewed the patient's labs from the last several visits to point out trends in the numbers        Patient Active Problem  List   Diagnosis    Dyslipidemia    Anxiety    Vitamin D deficiency    History of breast cancer    Neuropathy due to chemotherapeutic drug (HCC)    Overweight with body mass index (BMI) of 25 to 25.9 in adult    Arthritis, degenerative    Controlled type 2 diabetes mellitus, without long-term current use of insulin (HCC)       Assessment and plan:  1.  DM.  Continue januvia and controlled.  F/u ophth  2.  Hyperlipidemia. Continue lipitor   3.  Hypovitaminosis D. Supplementation per onco  4.  Anxiety.  Continue celexa  5.  Overweight.  Lifestyle and dietary measures.  Portion control reiterated.  6.  Endometrial ca.  Per Dr Sheral Flow  7.  Breast ca.  Per Dr Meryl Crutch and Dr Mariea Clonts  8.  Possible neuropathy from chemo.  Change gaba to 100 mg tabs and she will titrate the dosing herself to effect  9.  Venous disease.  Elevate, declined diuretic  10.  TMJ syndrome.  PT    RTC 3/24    Above conditions discussed at length and patient vocalized understanding.  All questions answered to patient satisfaction     Diagnosis Orders   1. Controlled type 2 diabetes mellitus without complication, without long-term current use of insulin (HCC)  Basic Metabolic Panel    HEMOGLOBIN A1C W/O EAG    Microalbumin / Creatinine Urine Ratio      2. Neuropathy due to chemotherapeutic drug (HCC)  gabapentin (NEURONTIN) 100 MG capsule      3. Vitamin D deficiency        4. History of breast cancer        5. Dyslipidemia        6. Anxiety

## 2022-07-16 NOTE — Progress Notes (Signed)
Chief Complaint   Patient presents with    Follow-up         Laura Roman presents today for   Chief Complaint   Patient presents with    Follow-up           1. "Have you been to the ER, urgent care clinic since your last visit?  Hospitalized since your last visit?" no    2. "Have you seen or consulted any other health care providers outside of the [facility] since your last visit?" Oncology     3. For patients aged 71-75: Has the patient had a colonoscopy / FIT/ Cologuard? No      If the patient is female:    4. For patients aged 56-74: Has the patient had a mammogram within the past 2 years? Yes - no Care Gap present      5. For patients aged 21-65: Has the patient had a pap smear? No

## 2022-07-17 ENCOUNTER — Ambulatory Visit: Payer: Medicare Other

## 2022-07-17 ENCOUNTER — Inpatient Hospital Stay: Admit: 2022-07-17 | Payer: MEDICARE | Primary: Internal Medicine

## 2022-07-17 DIAGNOSIS — M546 Pain in thoracic spine: Secondary | ICD-10-CM | POA: Diagnosis not present

## 2022-07-17 NOTE — Progress Notes (Signed)
PT DISCHARGE DAILY NOTE AND SUMMARY 01-23    Date:07/17/2022  Patient name: Laura Roman Start of Care: 06/04/22   Referral source: Albin Felling, MD DOB: 1951-06-25   Medical/Treatment Diagnosis: Neck pain [M54.2] Onset Date:March 2023     Prior Hospitalization: see medical history Provider#: [id number]   Medications: Verified on Patient Summary List    Comorbidities: Breast CA with radiation; allergies; anxiety; DM; neuropathy  Prior Level of Function: Able to eat all food without pain  Insurance: Payor: MEDICARE / Plan: MEDICARE PART A AND B / Product Type: *No Product type* /      Visits from Start of Care: 6 Missed Visits: 0    Reporting Period : 07/01/22 to 07/17/22     Patient DOB verified yes     Visit #   Current / Total 6 8   Time   In / Out 954 1032   Pain   In / Out 2 0   Subjective Functional Status/Changes: Reports no change. I still can't put 3 fingers in my mouth. Reports she doesn't know if PT is helping.      TREATMENT AREA =  Neck pain [M54.2]    OBJECTIVE    Modalities Rationale:     decrease pain and increase tissue extensibility to improve patient's ability to progress to PLOF and address remaining functional goals.     min '[]'  Estim Unattended, type/location:                                      '[]'   w/ice    '[]'   w/heat    min '[]'  Estim Attended, type/location:                                     '[]'   w/US     '[]'   w/ice    '[]'   w/heat    '[]'   TENS insruct      min '[]'   Mechanical Traction: type/lbs                   '[]'   pro   '[]'   sup   '[]'   int   '[]'   cont    '[]'   before manual    '[]'   after manual   8 min '[x]'   Ultrasound, settings/location:  1.0 left masseter     min  unbill '[]'   Ice     '[]'   Heat    location/position:     min '[]'   Paraffin,  details:     min '[]'   Vasopneumatic Device, press/temp:     min '[]'   Whirlpool / Fluido:    If using vaso (only need to measure limb vaso being performed on)      pre-treatment girth :       post-treatment girth :       measured at (landmark location) :      min '[]'   Other:     Skin assessment post-treatment:   Intact      Therapeutic Procedures:    Tx Min Billable or 1:1 Min (if diff from Tx Min) Procedure, Rationale, Specifics   20  [id number] Therapeutic Exercise (timed):  increase ROM, strength, coordination, balance, and proprioception to improve patient's ability to progress to PLOF and address remaining functional goals. (see flow sheet as  applicable)     Details if applicable:       10  [id number] Neuromuscular Re-Education (timed):  improve balance, coordination, kinesthetic sense, posture, core stability and proprioception to improve patient's ability to develop conscious control of individual muscles and awareness of position of extremities in order to progress to PLOF and address remaining functional goals. (see flow sheet as applicable)     Details if applicable:            Details if applicable:            Details if applicable:            Details if applicable:     41  MC BC Totals Reminder: bill using total billable min of TIMED therapeutic procedures (example: do not include dry needle or estim unattended, both untimed codes, in totals to left)  8-22 min = 1 unit; 23-37 min = 2 units; 38-52 min = 3 units; 53-67 min = 4 units; 68-82 min = 5 units   Total Total   38 TOTAL TREATMENT TIME    '[x]'   Patient Education billed concurrently with other procedures   '[x]'  Review HEP    '[]'  Progressed/Changed HEP, detail:    '[]'  Other detail:       Objective Information/Functional Measures/Assessment  Patient has reached a plateau with therapy and will continue with home maintenance program and monthly massages.    GOALS  1. I and compliant with HEP for self management of symptoms.   IE-issued HEP  PN: reports compliance  2. Improve FOTO to 52 to indicate improved function with daily activities.   IE-46  PN:47  3. Increase C/S B lateral flex and rotation by 5 deg to increase ease with ADLs.  IE-right lat flex 17; left lat flex 11; right rotation 44; left rot 42  HQ:IONGE lat flex 18; 15 deg; right  rot 49 deg; 48 left rot- partially met  4. Increase mandibular opening to 3 fingers to increase ease with eating sandwiches.  IE-2 fingers  PN:2.5 fingers- progressing  Current: no change   5. Patient will report >=50% improvement to increase ease with ADLs.  IE-0%  PN:30%  Current: no change     RECOMMENDATIONS  Discontinue therapy due to lack of appreciable progress towards goals.    Weston Settle, PT       07/17/2022       10:11 AM    Payor: MEDICARE / Plan: MEDICARE PART A AND B / Product Type: *No Product type* /      Not Medicaid Ins, no signature required for DC

## 2022-07-20 NOTE — Telephone Encounter (Signed)
I think she would need some type of clearance from Dr. Theda Sers at [facility] to put dissipate in physical therapy.  I do not feel comfortable reordering until he gets clearance since surgery is not an option

## 2022-07-20 NOTE — Telephone Encounter (Signed)
I called pt.  She said that needs new referral for PT, was not able to start back in 04/2022 when initially ordered due to a fall and hurting her knee.  She said that Dr. Theda Sers from [facility] said did not need surgery.  I relayed that PT may need that from him, but for now would ask MM/NP her questions of why is her balance off?  Other then aging.  Would like to renew referral.  (This may need a F2F within the last 30days?

## 2022-07-20 NOTE — Telephone Encounter (Signed)
I called pt relayed to pt that MM/NP is asking for clearance from Dr. Margretta Sidle [facility] that no surgery is needed for pt and may proceed with PT.  Pt verbalized understanding.

## 2022-07-21 ENCOUNTER — Ambulatory Visit: Payer: Medicare Other

## 2022-07-21 ENCOUNTER — Encounter: Payer: MEDICARE | Primary: Internal Medicine

## 2022-07-22 ENCOUNTER — Ambulatory Visit (INDEPENDENT_AMBULATORY_CARE_PROVIDER_SITE_OTHER): Payer: Medicare Other | Admitting: Family Medicine

## 2022-07-22 VITALS — BP 140/80 | HR 90

## 2022-07-22 DIAGNOSIS — Z23 Encounter for immunization: Secondary | ICD-10-CM

## 2022-07-22 DIAGNOSIS — D510 Vitamin B12 deficiency anemia due to intrinsic factor deficiency: Secondary | ICD-10-CM | POA: Diagnosis not present

## 2022-07-22 MED ORDER — CYANOCOBALAMIN 1000 MCG/ML IJ SOLN
1000.0000 ug | Freq: Once | INTRAMUSCULAR | Status: AC
  Administered 2022-07-22: 1000 ug via INTRAMUSCULAR

## 2022-07-22 NOTE — Progress Notes (Signed)
   Established Patient Office Visit  Subjective   Patient ID: Mia [name], female    DOB: 1951-10-25  Age: 71 y.o. MRN: [id number]  No chief complaint on file.   HPI    ROS    Objective:     BP (!) 140/80   Pulse 90   SpO2 97%    Physical Exam   No results found for any visits on 07/22/22.    The 10-year ASCVD risk score (Arnett DK, et al., 2019) is: 26.4%    Assessment & Plan:  Patient came in today for her B12 injections she denies any irregular heartbeat or muscle cramps and I administered the injection and patient tolerated well and she also got her flu shot high dose today. Problem List Items Addressed This Visit       Other   Pernicious anemia - Primary   Relevant Medications   cyanocobalamin (VITAMIN B12) injection 1,000 mcg (Start on 07/22/2022 10:45 AM)   Other Visit Diagnoses     Need for influenza vaccination       Relevant Orders   Flu Vaccine QUAD High Dose(Fluad)       No follow-ups on file.    Tarri Glenn, CMA

## 2022-07-22 NOTE — Progress Notes (Signed)
Agree with documentation as above.   Marissa Lowrey, MD  

## 2022-07-27 ENCOUNTER — Encounter: Payer: Self-pay | Admitting: Family Medicine

## 2022-07-27 ENCOUNTER — Encounter: Payer: MEDICARE | Primary: Internal Medicine

## 2022-07-29 ENCOUNTER — Encounter: Payer: Self-pay | Admitting: Internal Medicine

## 2022-07-29 ENCOUNTER — Ambulatory Visit (INDEPENDENT_AMBULATORY_CARE_PROVIDER_SITE_OTHER): Payer: Medicare Other | Admitting: Internal Medicine

## 2022-07-29 VITALS — BP 160/80 | HR 62 | Ht 66.0 in | Wt 179.4 lb

## 2022-07-29 DIAGNOSIS — M810 Age-related osteoporosis without current pathological fracture: Secondary | ICD-10-CM

## 2022-07-29 NOTE — Progress Notes (Signed)
Name: Mia [name]  MRN/ DOB: [id number], Mar 10, 1951    Age/ Sex: 71 y.o., female     PCP: Hali Marry, MD   Reason for Endocrinology Evaluation: Hyponatremia      Initial Endocrinology Clinic Visit: 05/21/2020    PATIENT IDENTIFIER: Mia [name] is a 71 y.o., female with a past medical history of emphysema, OSA on CPAP , SVT and Raynaud's phenomenon, RA ,and memory loss . She has followed with [facility] since 05/21/2020 for consultative assistance with management of her Hyponatremia       HISTORICAL SUMMARY:  Pt has been noted with hyponatremia in 02/2020 with a nadir of 129 mmol/L.  She was consuming ~ 60-80 oz of crushed ice as well as ~ 40 oz of fluid a day. She was on HCTZ at the time, which we stopped.    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   She was drinking ~160 oz of fluids a day in addition to being on HCTZ. Sodium normalized with fluid reduction and stopping HCTZ.   Patient returned to endocrinology clinic for consultation osteoporosis in 12/15/2021   OSTEOPOROSIS HISTORY:  Pt was diagnosed with osteoporosis:2020  Menarche at age : 76 Menopausal at age : Hysterectomy at age 26 due to bleeding . Oophorectomy at age 71 Fracture Hx: Probable right third metatarsal stress fracture 10/2020 (status post fall )left foot "jones fracture " Hx of HRT: Yes, for years  FH of osteoporosis or hip fracture:  sister  Prior Hx of anti-estrogenic therapy :no Prior Hx of anti-resorptive therapy : Alendronate in 2016 switched to  Prolia in 08/2017 until 11/2021    Received 1st dose of zoledronic acid 01/06/2022   24-hr urinary calcium 56 mg 11/2021  SUBJECTIVE:    Today (07/29/2022):  Mia [name] is here for a follow up on osteoporosis. She is accompanied by her husband today.     Sleep study was negative 12/2021 Continues to follow with Ortho for shoulder pain, she had a fall a few weeks  ago with meniscal tear but no new leg fracture.  Despite not having a hip fracture on x-ray, per patient she was diagnosed with a rib fracture based on the severity of her pain She follows with rheumatology 03/2022 for seronegative RA, OA and fibromyalgia  Was seen by cardiology 03/2022 for palpitations and Hx of SVT She had COVID in 05/2022, she has been tired since then   She is not on calcium, she has forgotten to take it    HISTORY:  Past Medical History:  Past Medical History:  Diagnosis Date   Anal fissure    Anemia    Arthritis    Blood transfusion    C. difficile colitis    Chest pain    a. 01/2013 MV: EF 59%, no ischemia.   Chronic Dyspnea    a. 01/2013 Echo: EF 60-65%, Gr 1 DD, PASP 41mHg.// Echo 01/2020: EF 60-65, no RWMA, GR 1 DD, GLS -21.6%, normal RV SF, trivial MR    COVID-19    DDD (degenerative disc disease), cervical    DDD (degenerative disc disease), lumbar    Diabetes mellitus    Fibromyalgia    Gall stones    GERD (gastroesophageal reflux disease)    gastritis   Hemorrhoids    Hypertension    Interstitial cystitis    MCI (mild cognitive impairment) 11/25/2017   Memory difficulty 12/14/2013  Neuromuscular disorder (HCC)    sclerosis   Osteoporosis    Peptic ulcer    PONV (postoperative nausea and vomiting)    Pseudogout    Raynaud phenomenon    Rectal bleeding    Sleep apnea    a. on cpap.   SVT (supraventricular tachycardia)    Syncope 09/10/2015   Thyroid disease    hypothyroidism   Tremor, essential 05/14/2016   Past Surgical History:  Past Surgical History:  Procedure Laterality Date   APPENDECTOMY     BLADDER SURGERY     x2   BLADDER SURGERY     BREAST BIOPSY     CARDIAC CATHETERIZATION     CARPAL TUNNEL RELEASE     CATARACT EXTRACTION Bilateral    CHOLECYSTECTOMY     DILATION AND CURETTAGE OF UTERUS     ENTEROCELE REPAIR     x2   EYE SURGERY     retina   herniated disc  11/25/2021   L4 and L5   hysterectomy - unknown type      JOINT REPLACEMENT     KNEE ARTHROPLASTY     KNEE SURGERY     x6   NECK SURGERY     fusion   OOPHORECTOMY     QUADRICEPS REPAIR Right    RECTOCELE REPAIR     x2   SHOULDER ARTHROSCOPY WITH SUBACROMIAL DECOMPRESSION, ROTATOR CUFF REPAIR AND BICEP TENDON REPAIR  10/06/2012   Procedure: SHOULDER ARTHROSCOPY WITH SUBACROMIAL DECOMPRESSION, ROTATOR CUFF REPAIR AND BICEP TENDON REPAIR;  Surgeon: Nita Sells, MD;  Location: [place];  Service: Orthopedics;  Laterality: Right;  Arthroscopic  Repair  of  Subscapularis, Open Biceps Tenodesis   SHOULDER SURGERY     bilateral- bones spur   SHOULDER SURGERY Left 04/15/2021   TONSILLECTOMY     TOTAL KNEE ARTHROPLASTY     TOTAL SHOULDER ARTHROPLASTY     Social History:  reports that she has never smoked. She has never been exposed to tobacco smoke. She has never used smokeless tobacco. She reports that she does not drink alcohol and does not use drugs. Family History:  Family History  Problem Relation Age of Onset   Heart attack Mother    Stroke Mother    Diabetes Mother    Heart disease Mother    Colon polyps Mother    Asthma Mother    Dementia Mother    Heart disease Father    Heart attack Father    Asthma Father    Stroke Sister    Hypertension Sister    Rheum arthritis Sister    Dementia Sister    Asthma Sister    Parkinson's disease Sister    Lupus Sister    Asthma Sister    Breast cancer Maternal Grandmother    Wilson's disease Maternal Grandmother    Pancreatic cancer Maternal Grandfather      HOME MEDICATIONS: Allergies as of 07/29/2022       Reactions   Codeine Nausea And Vomiting   Myrbetriq  [mirabegron] Other (See Comments)   SEVERE HEADACHE   Nystatin    Percocet [oxycodone-acetaminophen] Nausea And Vomiting   Propoxyphene Nausea And Vomiting   Oxycodone-acetaminophen Nausea Only   Celebrex [celecoxib] Other (See Comments)   Other reaction(s): Other flushed   Darvocet [propoxyphene  N-acetaminophen] Nausea And Vomiting   Erythromycin Nausea And Vomiting   Nausea and vomiting   Hydrocodone Nausea And Vomiting   Lyrica [pregabalin] Swelling   Legs swelling  Macrodantin [nitrofurantoin] Nausea And Vomiting   Toradol [ketorolac Tromethamine] Nausea And Vomiting   Per patient, only PO form causes nausea and vomiting. Can take injection without issue   Tramadol Nausea And Vomiting   Verapamil Nausea And Vomiting   REACTION: intolerance        Medication List        Accurate as of July 29, 2022 12:51 PM. If you have any questions, ask your nurse or doctor.          albuterol 108 (90 Base) MCG/ACT inhaler Commonly known as: VENTOLIN HFA Inhale 2 puffs into the lungs as needed.   amitriptyline 10 MG tablet Commonly known as: ELAVIL Take by mouth.   CLARITIN PO Take by mouth.   colestipol 1 g tablet Commonly known as: COLESTID Take 1 g by mouth 2 (two) times daily.   donepezil 10 MG tablet Commonly known as: ARICEPT TAKE ONE TABLET BY MOUTH EVERY NIGHT AT BEDTIME   gabapentin 300 MG capsule Commonly known as: NEURONTIN TAKE 1 TO 2 CAPSULES BY MOUTH UP TO TWO TIMES DAILY AS NEEDED What changed: See the new instructions.   ibuprofen 600 MG tablet Commonly known as: ADVIL ibuprofen 600 mg tablet  TAKE 1 TABLET BY MOUTH EVERY 6 HOURS AS NEEDED   ipratropium 0.03 % nasal spray Commonly known as: ATROVENT Place 2 sprays into both nostrils every 12 (twelve) hours.   leflunomide 20 MG tablet Commonly known as: ARAVA Take 1 tablet (20 mg total) by mouth daily.   Melatonin 10 MG Caps Take 10 capsules by mouth daily.   memantine 10 MG tablet Commonly known as: NAMENDA Take 1 tablet (10 mg total) by mouth 2 (two) times daily.   metoprolol tartrate 100 MG tablet Commonly known as: LOPRESSOR 1 AND 1/2 TABLET BY MOUTH 2 TWICE DAILY   ondansetron 4 MG tablet Commonly known as: ZOFRAN Take by mouth as needed.   ondansetron 8 MG  disintegrating tablet Commonly known as: ZOFRAN-ODT Take 1 tablet by mouth as needed for nausea.   pantoprazole 40 MG tablet Commonly known as: PROTONIX Take 40 mg by mouth 2 (two) times daily.   polyethylene glycol powder 17 GM/SCOOP powder Commonly known as: GLYCOLAX/MIRALAX Take 17 g by mouth daily as needed for mild constipation or moderate constipation.   primidone 50 MG tablet Commonly known as: MYSOLINE Take 1 tablet (50 mg total) by mouth at bedtime.   valsartan 160 MG tablet Commonly known as: DIOVAN Take 1 tablet (160 mg total) by mouth 2 (two) times daily.   venlafaxine XR 75 MG 24 hr capsule Commonly known as: EFFEXOR-XR TAKE ONE CAPSULE BY MOUTH DAILY WITH BREAFAST   VITAMIN B-12 IJ Inject 1 mL as directed every 30 (thirty) days.   zoledronic acid 5 MG/100ML Soln injection Commonly known as: RECLAST          OBJECTIVE:   PHYSICAL EXAM: VS: BP (!) 160/80   Pulse 62   Ht '5\' 6"'  (1.676 m)   Wt 179 lb 6.4 oz (81.4 kg)   SpO2 93%   BMI 28.96 kg/m    EXAM: General: Pt appears well and is in NAD  Lungs: Clear with good BS bilat with no rales, rhonchi, or wheezes  Heart: Auscultation: RRR.  Abdomen: Normoactive bowel sounds, soft, nontender, without masses or organomegaly palpable  Extremities:  BL LE: Trace pretibial edema   Mental Status: Judgment, insight: Intact Orientation: Oriented to time, place, and person Mood and affect: No depression, anxiety,  or agitation     DATA REVIEWED:   Latest Reference Range & Units 06/24/22 13:47  Sodium 135 - 146 mmol/L 132 (L)  Potassium 3.5 - 5.3 mmol/L 5.0  Chloride 98 - 110 mmol/L 95 (L)  CO2 20 - 32 mmol/L 29  Glucose 65 - 99 mg/dL 91  BUN 7 - 25 mg/dL 9  Creatinine 0.60 - 1.00 mg/dL 0.80  Calcium 8.6 - 10.4 mg/dL 9.2  BUN/Creatinine Ratio 6 - 22 (calc) SEE NOTE:  eGFR > OR = 60 mL/min/1.44m 79  AG Ratio 1.0 - 2.5 (calc) 1.4  AST 10 - 35 U/L 29  ALT 6 - 29 U/L 29  Total Protein 6.1 - 8.1 g/dL  6.3  Total Bilirubin 0.2 - 1.2 mg/dL 0.3    Latest Reference Range & Units 06/24/22 13:47  WBC 3.8 - 10.8 Thousand/uL 6.8  RBC 3.80 - 5.10 Million/uL 3.96  Hemoglobin 11.7 - 15.5 g/dL 11.4 (L)  HCT 35.0 - 45.0 % 35.2  MCV 80.0 - 100.0 fL 88.9  MCH 27.0 - 33.0 pg 28.8  MCHC 32.0 - 36.0 g/dL 32.4  RDW 11.0 - 15.0 % 13.8  Platelets 140 - 400 Thousand/uL 387  MPV 7.5 - 12.5 fL 9.6  Neutrophils % 53.3  Monocytes Relative % 13.1  Eosinophil % 2.4  Basophil % 0.9  NEUT# 1,500 - 7,800 cells/uL 3,624  Lymphocyte # 850 - 3,900 cells/uL 2,060  Total Lymphocyte % 30.3  Eosinophils Absolute 15 - 500 cells/uL 163  Basophils Absolute 0 - 200 cells/uL 61  Absolute Monocytes 200 - 950 cells/uL 891         06/07/2019 06/20/2021 Change from prior  RFN NA -2.6   Right total hip -1.3 -1.10 Up 4%  LFN NA -1.6   Left total hip -1.4 -0.9 Up to 8%  AP spine +0.00 +1.1 Up 11%    ASSESSMENT / PLAN / RECOMMENDATIONS:   Osteoporosis     - She is using a walker due to unsteady gait  -In review of her chart the patient has been on alendronate in 2016, she believes she started having jaw pains and also because it was once weekly dosing she was forgetting it  -She was on Prolia from 2018 until 2023, we opted to start drug holiday -She received her first zoledronic acid injection 01/06/2022 without any side effects -We again emphasized the importance of optimizing calcium intake -Also discussed weightbearing exercises but she has an steady gait and high risk of falling -We will repeat DXA scan next year -No evidence of secondary causes for osteoporosis based on prior testing.     F/U 1 year  Signed electronically by: AMack Guise MD  [facility]  [address] [place] [address], [place] [address]Phone: [phone]: [phone]     CC: MHali Marry [id number]:  [phone] Fax: [phone]  Return to Endocrinology clinic as below: Future Appointments  Date Time Provider DByhalia 08/20/2022  9:10 AM MHali Marry MD PCK-PCK None  10/06/2022 10:20 AM DOfilia Neas PA-C CR-GSO None  12/02/2022 10:00 AM MWard Givens NP GNA-GNA None  12/17/2022 11:00 AM PCK-CCM PHARMACIST PCK-PCK None  05/11/2023  9:00 AM PCK-NURSE HEALTH ADVISOR PCK-PCK None  08/04/2023 11:30 AM Jenin Birdsall, IMelanie Crazier MD LBPC-LBENDO None

## 2022-07-29 NOTE — Patient Instructions (Signed)
Start Calcium supplements  1000-1200 mg daily

## 2022-07-31 ENCOUNTER — Other Ambulatory Visit: Payer: Self-pay | Admitting: Rheumatology

## 2022-08-03 NOTE — Telephone Encounter (Signed)
Next Visit: 10/06/2022  Last Visit: 05/19/2022  Last Fill: 05/05/2022  DX: Seronegative rheumatoid arthritis   Current Dose per office note 05/19/2022: Arava 20 mg p.o. daily  Labs: 06/24/2022 Hgb 11.4, Sodium 132, Chloride 95  Okay to refill Arava?

## 2022-08-10 NOTE — Telephone Encounter (Signed)
Requesting office sent a duplicate request. I have reviewed Dr. Garner Gavel note and he has given clearance. I will re-fax these notes to requesting office.

## 2022-08-11 ENCOUNTER — Encounter: Payer: Self-pay | Admitting: Adult Health

## 2022-08-12 NOTE — Telephone Encounter (Signed)
I do not see notes from [facility] do you guys see this?

## 2022-08-17 NOTE — Telephone Encounter (Signed)
Ok to order 

## 2022-08-17 NOTE — Telephone Encounter (Signed)
Looking at the order from 04/2022 it looks like this was a POC from [facility].  There recommendation.  There is a place to sign, update the date.  Will see if will work or need new order.

## 2022-08-17 NOTE — Telephone Encounter (Signed)
  Copied and pasted letter submitted by patient as attachment

## 2022-08-18 DIAGNOSIS — R42 Dizziness and giddiness: Secondary | ICD-10-CM | POA: Diagnosis not present

## 2022-08-18 DIAGNOSIS — G8929 Other chronic pain: Secondary | ICD-10-CM | POA: Diagnosis not present

## 2022-08-18 DIAGNOSIS — E119 Type 2 diabetes mellitus without complications: Secondary | ICD-10-CM | POA: Diagnosis not present

## 2022-08-18 DIAGNOSIS — I160 Hypertensive urgency: Secondary | ICD-10-CM | POA: Diagnosis not present

## 2022-08-18 DIAGNOSIS — R0600 Dyspnea, unspecified: Secondary | ICD-10-CM | POA: Diagnosis not present

## 2022-08-18 DIAGNOSIS — F39 Unspecified mood [affective] disorder: Secondary | ICD-10-CM | POA: Diagnosis not present

## 2022-08-18 DIAGNOSIS — M79671 Pain in right foot: Secondary | ICD-10-CM | POA: Diagnosis not present

## 2022-08-18 DIAGNOSIS — G319 Degenerative disease of nervous system, unspecified: Secondary | ICD-10-CM | POA: Diagnosis not present

## 2022-08-18 DIAGNOSIS — M797 Fibromyalgia: Secondary | ICD-10-CM | POA: Diagnosis not present

## 2022-08-18 DIAGNOSIS — R600 Localized edema: Secondary | ICD-10-CM | POA: Diagnosis not present

## 2022-08-18 DIAGNOSIS — T300 Burn of unspecified body region, unspecified degree: Secondary | ICD-10-CM | POA: Diagnosis not present

## 2022-08-18 DIAGNOSIS — Z981 Arthrodesis status: Secondary | ICD-10-CM | POA: Diagnosis not present

## 2022-08-18 DIAGNOSIS — M79672 Pain in left foot: Secondary | ICD-10-CM | POA: Diagnosis not present

## 2022-08-18 DIAGNOSIS — R531 Weakness: Secondary | ICD-10-CM | POA: Diagnosis not present

## 2022-08-18 DIAGNOSIS — E785 Hyperlipidemia, unspecified: Secondary | ICD-10-CM | POA: Diagnosis not present

## 2022-08-18 DIAGNOSIS — I10 Essential (primary) hypertension: Secondary | ICD-10-CM | POA: Diagnosis not present

## 2022-08-18 DIAGNOSIS — M84475A Pathological fracture, left foot, initial encounter for fracture: Secondary | ICD-10-CM | POA: Diagnosis not present

## 2022-08-18 DIAGNOSIS — K219 Gastro-esophageal reflux disease without esophagitis: Secondary | ICD-10-CM | POA: Diagnosis not present

## 2022-08-18 DIAGNOSIS — L03011 Cellulitis of right finger: Secondary | ICD-10-CM | POA: Diagnosis not present

## 2022-08-20 ENCOUNTER — Ambulatory Visit: Payer: Medicare Other | Admitting: Family Medicine

## 2022-08-23 DIAGNOSIS — S6992XA Unspecified injury of left wrist, hand and finger(s), initial encounter: Secondary | ICD-10-CM | POA: Diagnosis not present

## 2022-08-23 DIAGNOSIS — S99921A Unspecified injury of right foot, initial encounter: Secondary | ICD-10-CM | POA: Diagnosis not present

## 2022-08-23 DIAGNOSIS — S92901A Unspecified fracture of right foot, initial encounter for closed fracture: Secondary | ICD-10-CM | POA: Diagnosis not present

## 2022-08-24 ENCOUNTER — Telehealth: Payer: Self-pay | Admitting: *Deleted

## 2022-08-24 NOTE — Telephone Encounter (Signed)
Pt in [place] she has been there for the past 3 weeks.   She stated that she burned herself getting a cup out of the microwave. She [facility] but because of her BP being so elevated she had to be sent to an ER. She went to the ER and was given ABX and Hydralazine via IV. She was transferred to the hospital because her BP still wasn't going down. She said that the doctor at the hospital added hydralazine po tid,valsartan Qam, metoprolol 100 mg, told to take bp 3x daily.  She was told that if her bp is over 110 to take the hydralazine. In the evening take her bp if her bp is over 110 take valsartan,hydralazine, and metoprolol wait 90 mins if it gets over 180 to the ED to be treated.  She said that during this time she fell over her daughters 2 dogs. She also fell again on Saturday walking thru the parking lot. She has been using a walker.   She went to a UC on yesterday ahd has Fractures in her foot. She is now in a boot.  Pt advised that she should continue taking the medication as directed by the Hospital. And to keep her f/u appt with Dr. Madilyn Fireman continue to keep log of BP's and times of these.   Pt also advised to contact her Rheumatologist and Ortho for follow up.

## 2022-08-24 NOTE — Telephone Encounter (Signed)
Please refill if appropriate or refuse medication if not appropriate.    PCP: Albin Felling, MD     Last appt: 07/16/22    Last refill: 08/18/21      Future Appointments   Date Time Provider [place]   11/19/2022 10:30 AM Clancy Gourd, MD [facility]   01/08/2023  9:00 AM [facility] LAB VISIT [facility] AMB   01/15/2023  9:20 AM Dumaran, Micheal Likens, MD [facility] AMB         Requested Prescriptions     Pending Prescriptions Disp Refills    atorvastatin (LIPITOR) 40 MG tablet [Pharmacy Med Name: ATORVASTATIN 40 MG TABLET] 90 tablet      Sig: take 1 tablet by mouth once daily

## 2022-08-25 MED ORDER — ATORVASTATIN CALCIUM 40 MG PO TABS
40 MG | ORAL_TABLET | ORAL | 3 refills | Status: DC
Start: 2022-08-25 — End: 2023-09-03

## 2022-08-28 ENCOUNTER — Ambulatory Visit: Payer: Medicare Other

## 2022-09-02 ENCOUNTER — Ambulatory Visit (INDEPENDENT_AMBULATORY_CARE_PROVIDER_SITE_OTHER): Payer: Medicare Other | Admitting: Family Medicine

## 2022-09-02 VITALS — BP 123/52 | HR 58 | Ht 66.0 in

## 2022-09-02 DIAGNOSIS — E538 Deficiency of other specified B group vitamins: Secondary | ICD-10-CM

## 2022-09-02 MED ORDER — CYANOCOBALAMIN 1000 MCG/ML IJ SOLN
1000.0000 ug | Freq: Once | INTRAMUSCULAR | Status: AC
  Administered 2022-09-02: 1000 ug via INTRAMUSCULAR

## 2022-09-02 NOTE — Progress Notes (Signed)
   Established Patient Office Visit  Subjective   Patient ID: MIRREN GEST, female    DOB: [birth year]  Age: 71 y.o. MRN: [id number]  Chief Complaint  Patient presents with   nurse visit for B12 injection    HPI B12 injection- patient denies GI problems, muscle cramps, dizziness or problems with the medication.   ROS    Objective:     BP (!) 123/52   Pulse (!) 58   Ht '5\' 6"'$  (1.676 m)   SpO2 100%   BMI 28.96 kg/m    Physical Exam   No results found for any visits on 09/02/22.    The 10-year ASCVD risk score (Arnett DK, et al., 2019) is: 23.3%    Assessment & Plan:  B12 injection- patient tolerated injection well without complications.  Patient to return in 4 weeks for nurse visit for B12 injection- patient mentioned that she had been in ER in [place] recently and a new medication was added. She does not remember this medication name. Spoke with Dr. Madilyn Fireman who reviewed patient chart and approved her to wait for f/u at next scheduled appointment on 09/15/22 Problem List Items Addressed This Visit       Other   B12 deficiency - Primary    Return in about 4 weeks (around 09/30/2022).    [name], MD

## 2022-09-02 NOTE — Patient Instructions (Signed)
Patient to  return in 4 weeks for B12 injection.

## 2022-09-04 MED ORDER — LANSOPRAZOLE 30 MG PO CPDR
30 MG | ORAL_CAPSULE | ORAL | 3 refills | Status: DC
Start: 2022-09-04 — End: 2023-09-03

## 2022-09-07 NOTE — Telephone Encounter (Signed)
Ordered signed and faxed this am to [facility].  Received per fax confirmation. [phone], fax [phone].

## 2022-09-15 ENCOUNTER — Encounter: Payer: Self-pay | Admitting: Family Medicine

## 2022-09-15 ENCOUNTER — Ambulatory Visit (INDEPENDENT_AMBULATORY_CARE_PROVIDER_SITE_OTHER): Payer: Medicare Other | Admitting: Family Medicine

## 2022-09-15 VITALS — BP 108/76 | HR 63 | Ht 66.0 in | Wt 174.0 lb

## 2022-09-15 DIAGNOSIS — R197 Diarrhea, unspecified: Secondary | ICD-10-CM

## 2022-09-15 DIAGNOSIS — I5032 Chronic diastolic (congestive) heart failure: Secondary | ICD-10-CM

## 2022-09-15 DIAGNOSIS — R7301 Impaired fasting glucose: Secondary | ICD-10-CM | POA: Diagnosis not present

## 2022-09-15 DIAGNOSIS — I10 Essential (primary) hypertension: Secondary | ICD-10-CM | POA: Diagnosis not present

## 2022-09-15 LAB — POCT GLYCOSYLATED HEMOGLOBIN (HGB A1C): Hemoglobin A1C: 5.2 % (ref 4.0–5.6)

## 2022-09-15 MED ORDER — AMLODIPINE BESYLATE 2.5 MG PO TABS: 2.5000 mg | ORAL_TABLET | Freq: Every evening | ORAL | 0 refills | Status: DC

## 2022-09-15 NOTE — Assessment & Plan Note (Signed)
She denies any recent swelling or chest pain or shortness of breath.

## 2022-09-15 NOTE — Progress Notes (Signed)
Established Patient Office Visit  Subjective   Patient ID: Mia [name], female    DOB: [birth year]  Age: 70 y.o. MRN: [id number]  Chief Complaint  Patient presents with   Hypertension   Diarrhea    HPI She is here today for hospital follow-up.  She was recently traveling in [place] and had a burn on one of her fingers she ended up [facility] to have it evaluated because it did not look like it was healing well and while there her blood pressure systolic was greater than 200.  She was then transferred to the emergency department where blood pressures were still greater than 200.  She was started on hydralazine.  And was given IV antibiotics while there.  She was hospitalized for 3 days.  She then came back home in about a week and a half later, starting last Tuesday, she started having vomiting and diarrhea.  She vomited about 3 times overnight and then that resolved but she has continued to have loose watery diarrhea since then.  She denies any blood in her urine or stool.  She did run a low-grade temp around 100.3 last week.  But none this week.  She has had decreased appetite.  No significant abdominal pain.  History of irritable bowel syndrome with diarrhea predominance.   Hypertension- Pt denies chest pain, SOB, dizziness, or heart palpitations.  Taking meds as directed w/o problems.  Denies medication side effects.  Concerned because her blood pressures have been running a little bit lower in the mornings and have been high at night.     ROS    Objective:     BP 108/76   Pulse 63   Ht '5\' 6"'$  (1.676 m)   Wt 174 lb (78.9 kg)   SpO2 98%   BMI 28.08 kg/m    Physical Exam Vitals and nursing note reviewed.  Constitutional:      Appearance: She is well-developed.  HENT:     Head: Normocephalic and atraumatic.  Cardiovascular:     Rate and Rhythm: Normal rate and regular rhythm.     Heart sounds: Normal heart sounds.  Pulmonary:     Effort: Pulmonary effort  is normal.     Breath sounds: Normal breath sounds.  Skin:    General: Skin is warm and dry.  Neurological:     Mental Status: She is alert and oriented to person, place, and time.  Psychiatric:        Behavior: Behavior normal.      Results for orders placed or performed in visit on 09/15/22  POCT glycosylated hemoglobin (Hb A1C)  Result Value Ref Range   Hemoglobin A1C 5.2 4.0 - 5.6 %   HbA1c POC (<> result, manual entry)     HbA1c, POC (prediabetic range)     HbA1c, POC (controlled diabetic range)        The 10-year ASCVD risk score (Arnett DK, et al., 2019) is: 18.5%    Assessment & Plan:   Problem List Items Addressed This Visit       Cardiovascular and Mediastinum   Hypertension - Primary    Blood pressure looks good in fact it is a little bit on the lower end today.  Her home cuff is pretty accurate with our cuff really only differing by about 4 points.  The blood pressures have been elevated in the evenings.  Is been using the hydralazine twice a day.  We will discontinue the hydralazine  that was given in the emergency department and add a low-dose of amlodipine for her to take in the late afternoon to see if that helps with the evening pressures.      Relevant Medications   amLODipine (NORVASC) 2.5 MG tablet   Other Relevant Orders   CBC with Differential/Platelet   COMPLETE METABOLIC PANEL WITH GFR   Lipase   C. difficile GDH and Toxin A/B   GI pathogen panel by PCR, stool   Chronic diastolic CHF (congestive heart failure) ([place])    She denies any recent swelling or chest pain or shortness of breath.      Relevant Medications   amLODipine (NORVASC) 2.5 MG tablet   Other Relevant Orders   CBC with Differential/Platelet   COMPLETE METABOLIC PANEL WITH GFR   Lipase   C. difficile GDH and Toxin A/B   GI pathogen panel by PCR, stool     Endocrine   IFG (impaired fasting glucose)   Relevant Orders   POCT glycosylated hemoglobin (Hb A1C) (Completed)    CBC with Differential/Platelet   COMPLETE METABOLIC PANEL WITH GFR   Lipase   C. difficile GDH and Toxin A/B   GI pathogen panel by PCR, stool   Other Visit Diagnoses     Diarrhea of presumed infectious origin       Relevant Orders   CBC with Differential/Platelet   COMPLETE METABOLIC PANEL WITH GFR   Lipase   C. difficile GDH and Toxin A/B   GI pathogen panel by PCR, stool      Diarrhea-possibly is needed for other type of infectious process such as Salmonella or Shigella.  It started about a week and a half after getting IV antibiotics for a infection on her finger secondary to a burn.  Been having some sinus congestion and pressure for about the last week as well but I would not recommend treat with antibiotics at this point until we know for sure if she has C. difficile or not.  Return if symptoms worsen or fail to improve.    [name], MD

## 2022-09-15 NOTE — Assessment & Plan Note (Addendum)
Blood pressure looks good in fact it is a little bit on the lower end today.  Her home cuff is pretty accurate with our cuff really only differing by about 4 points.  The blood pressures have been elevated in the evenings.  Is been using the hydralazine twice a day.  We will discontinue the hydralazine that was given in the emergency department and add a low-dose of amlodipine for her to take in the late afternoon to see if that helps with the evening pressures.

## 2022-09-15 NOTE — Patient Instructions (Signed)
OK to stop the hydralazine Please start amlodipine in the late afternoon to see if helps with evening pressures being behind.   Try Flonase or Nasonex for the nasal pressure and congestion and see if that helps.  Also recommend nasal saline irrigation twice a day to help remove any excess mucus.  But we really want to hold off on antibiotics at this point until we know for sure whether or not you might have C. difficile.

## 2022-09-15 NOTE — Assessment & Plan Note (Deleted)
Pressure is a little elevated today.  Repeat was still elevated like to have her come back in about 2 weeks for nurse visit.  She says she is not falling back asleep this morning and so just woke up before coming in.

## 2022-09-16 LAB — COMPLETE METABOLIC PANEL WITH GFR
AG Ratio: 1.2 (calc) (ref 1.0–2.5)
ALT: 38 U/L — ABNORMAL HIGH (ref 6–29)
AST: 38 U/L — ABNORMAL HIGH (ref 10–35)
Albumin: 3.6 g/dL (ref 3.6–5.1)
Alkaline phosphatase (APISO): 190 U/L — ABNORMAL HIGH (ref 37–153)
BUN: 7 mg/dL (ref 7–25)
CO2: 29 mmol/L (ref 20–32)
Calcium: 9.3 mg/dL (ref 8.6–10.4)
Chloride: 99 mmol/L (ref 98–110)
Creat: 0.77 mg/dL (ref 0.60–1.00)
Globulin: 3 g/dL (calc) (ref 1.9–3.7)
Glucose, Bld: 112 mg/dL — ABNORMAL HIGH (ref 65–99)
Potassium: 4.3 mmol/L (ref 3.5–5.3)
Sodium: 135 mmol/L (ref 135–146)
Total Bilirubin: 0.9 mg/dL (ref 0.2–1.2)
Total Protein: 6.6 g/dL (ref 6.1–8.1)
eGFR: 82 mL/min/{1.73_m2} (ref >=60)

## 2022-09-16 LAB — CBC WITH DIFFERENTIAL/PLATELET
Absolute Monocytes: 791 cells/uL (ref 200–950)
Basophils Absolute: 84 cells/uL (ref 0–200)
Basophils Relative: 0.9 %
Eosinophils Absolute: 549 cells/uL — ABNORMAL HIGH (ref 15–500)
Eosinophils Relative: 5.9 %
HCT: 34.6 % — ABNORMAL LOW (ref 35.0–45.0)
Hemoglobin: 11.3 g/dL — ABNORMAL LOW (ref 11.7–15.5)
Lymphs Abs: 1135 cells/uL (ref 850–3900)
MCH: 29.3 pg (ref 27.0–33.0)
MCHC: 32.7 g/dL (ref 32.0–36.0)
MCV: 89.6 fL (ref 80.0–100.0)
MPV: 11.6 fL (ref 7.5–12.5)
Monocytes Relative: 8.5 %
Neutro Abs: 6743 cells/uL (ref 1500–7800)
Neutrophils Relative %: 72.5 %
Platelets: 449 10*3/uL — ABNORMAL HIGH (ref 140–400)
RBC: 3.86 10*6/uL (ref 3.80–5.10)
RDW: 13.6 % (ref 11.0–15.0)
Total Lymphocyte: 12.2 %
WBC: 9.3 10*3/uL (ref 3.8–10.8)

## 2022-09-16 LAB — LIPASE: Lipase: 33 U/L (ref 7–60)

## 2022-09-16 NOTE — Progress Notes (Signed)
Hi Nila, hemoglobin looks stable at 11.3.  Eosinophils are elevated which usually indicates some allergies going on.  Blood count looks good as far as your electrolytes are normal nothing looks to out of whack which is actually reassuring considering how much diarrhea you have been having.  Your liver enzymes are mildly elevated.  So just make sure staying hydrated.  And I would like to recheck your liver enzymes in 1 week to see if they are trending back down or if they are trending upward.  If they remain elevated then we may get an ultrasound of your liver.  Right now they are just a little borderline elevated.  No sign of pancreatitis.

## 2022-09-19 ENCOUNTER — Encounter (HOSPITAL_COMMUNITY): Payer: Self-pay

## 2022-09-19 ENCOUNTER — Other Ambulatory Visit: Payer: Self-pay

## 2022-09-19 ENCOUNTER — Emergency Department (HOSPITAL_COMMUNITY)
Admission: EM | Admit: 2022-09-19 | Discharge: 2022-09-19 | Disposition: A | Discharge status: Home / Self Care | Payer: Medicare Other | Attending: Emergency Medicine | Admitting: Emergency Medicine

## 2022-09-19 ENCOUNTER — Emergency Department (HOSPITAL_COMMUNITY): Payer: Medicare Other

## 2022-09-19 DIAGNOSIS — R0602 Shortness of breath: Secondary | ICD-10-CM | POA: Diagnosis not present

## 2022-09-19 DIAGNOSIS — I5032 Chronic diastolic (congestive) heart failure: Secondary | ICD-10-CM | POA: Insufficient documentation

## 2022-09-19 DIAGNOSIS — Z96651 Presence of right artificial knee joint: Secondary | ICD-10-CM | POA: Diagnosis not present

## 2022-09-19 DIAGNOSIS — I10 Essential (primary) hypertension: Secondary | ICD-10-CM | POA: Diagnosis not present

## 2022-09-19 DIAGNOSIS — I5189 Other ill-defined heart diseases: Secondary | ICD-10-CM

## 2022-09-19 DIAGNOSIS — Z79899 Other long term (current) drug therapy: Secondary | ICD-10-CM | POA: Insufficient documentation

## 2022-09-19 DIAGNOSIS — I158 Other secondary hypertension: Secondary | ICD-10-CM | POA: Insufficient documentation

## 2022-09-19 DIAGNOSIS — I110 Hypertensive heart disease with heart failure: Secondary | ICD-10-CM | POA: Insufficient documentation

## 2022-09-19 DIAGNOSIS — I959 Hypotension, unspecified: Secondary | ICD-10-CM | POA: Diagnosis not present

## 2022-09-19 DIAGNOSIS — Z8616 Personal history of COVID-19: Secondary | ICD-10-CM | POA: Diagnosis not present

## 2022-09-19 DIAGNOSIS — R0689 Other abnormalities of breathing: Secondary | ICD-10-CM | POA: Diagnosis not present

## 2022-09-19 LAB — BASIC METABOLIC PANEL
Anion gap: 13 (ref 5–15)
BUN: 7 mg/dL — ABNORMAL LOW (ref 8–23)
CO2: 25 mmol/L (ref 22–32)
Calcium: 9.7 mg/dL (ref 8.9–10.3)
Chloride: 100 mmol/L (ref 98–111)
Creatinine, Ser: 0.85 mg/dL (ref 0.44–1.00)
GFR, Estimated: >60 mL/min (ref >60)
Glucose, Bld: 121 mg/dL — ABNORMAL HIGH (ref 70–99)
Potassium: 3.9 mmol/L (ref 3.5–5.1)
Sodium: 138 mmol/L (ref 135–145)

## 2022-09-19 LAB — HEPATIC FUNCTION PANEL
ALT: 32 U/L (ref 0–44)
AST: 38 U/L (ref 15–41)
Albumin: 2.9 g/dL — ABNORMAL LOW (ref 3.5–5.0)
Alkaline Phosphatase: 137 U/L — ABNORMAL HIGH (ref 38–126)
Bilirubin, Direct: 0.2 mg/dL (ref 0.0–0.2)
Indirect Bilirubin: 0.1 mg/dL — ABNORMAL LOW (ref 0.3–0.9)
Total Bilirubin: 0.3 mg/dL (ref 0.3–1.2)
Total Protein: 6.4 g/dL — ABNORMAL LOW (ref 6.5–8.1)

## 2022-09-19 LAB — CBC WITH DIFFERENTIAL/PLATELET
Abs Immature Granulocytes: 0.07 10*3/uL (ref 0.00–0.07)
Basophils Absolute: 0.1 10*3/uL (ref 0.0–0.1)
Basophils Relative: 1 %
Eosinophils Absolute: 0.5 10*3/uL (ref 0.0–0.5)
Eosinophils Relative: 7 %
HCT: 35.5 % — ABNORMAL LOW (ref 36.0–46.0)
Hemoglobin: 11.3 g/dL — ABNORMAL LOW (ref 12.0–15.0)
Immature Granulocytes: 1 %
Lymphocytes Relative: 30 %
Lymphs Abs: 2.3 10*3/uL (ref 0.7–4.0)
MCH: 29.2 pg (ref 26.0–34.0)
MCHC: 31.8 g/dL (ref 30.0–36.0)
MCV: 91.7 fL (ref 80.0–100.0)
Monocytes Absolute: 0.8 10*3/uL (ref 0.1–1.0)
Monocytes Relative: 10 %
Neutro Abs: 3.9 10*3/uL (ref 1.7–7.7)
Neutrophils Relative %: 51 %
Platelets: 508 10*3/uL — ABNORMAL HIGH (ref 150–400)
RBC: 3.87 MIL/uL (ref 3.87–5.11)
RDW: 15.4 % (ref 11.5–15.5)
WBC: 7.7 10*3/uL (ref 4.0–10.5)
nRBC: 0 % (ref 0.0–0.2)

## 2022-09-19 LAB — BRAIN NATRIURETIC PEPTIDE: B Natriuretic Peptide: 182.6 pg/mL — ABNORMAL HIGH (ref 0.0–100.0)

## 2022-09-19 MED ORDER — FUROSEMIDE 20 MG PO TABS: 20.0000 mg | ORAL_TABLET | Freq: Every day | ORAL | 0 refills | Status: DC

## 2022-09-19 NOTE — ED Provider Notes (Signed)
[name] Provider Note  CSN: [id number] Arrival date & time: 09/19/22 0035  Chief Complaint(s) Hypertension (BIB by EMS for HTN, pt reports BP changes have been all over the place, recent medication dosage changes, with EMS 220/100, pt reports no pain or vision changes, negative for stroke sx, pt c/o small HA)  HPI M[name] is a 71 y.o. female with a past medical history listed below including hypertension, diastolic heart failure who presents to the emergency department with elevated blood pressures noted this evening.  Initially check her blood pressures around 10 PM and noted systolics in the 503T.  Patient took her regular nighttime blood pressure medication and waited an hour before checking in again.  She noted that her systolic blood pressure was in the 200s now.  This prompted a call to EMS to ensure her blood pressure cuff was accurate.  With EMS her blood pressures were 220s over 100s.  She complained of mild headache and dyspnea on exertion whenever she has elevated blood pressures.  Reports that she recently had medication change of her blood pressure medication.  States that earlier in the month she was in [place] and had to be hospitalized for finger infection elevated blood pressures.  At that time she was started on hydralazine.  Earlier this week, the patient saw her primary care provider who discontinued hydralazine and put her on amlodipine.  Patient denies any associated visual disturbance, focal deficits, chest pain or shortness of breath at rest.  No lower extremity edema.  The history is provided by the patient.    Past Medical History Past Medical History:  Diagnosis Date   Anal fissure    Anemia    Arthritis    Blood transfusion    C. difficile colitis    Chest pain    a. 01/2013 MV: EF 59%, no ischemia.   Chronic Dyspnea    a. 01/2013 Echo: EF 60-65%, Gr 1 DD, PASP 29mHg.// Echo 01/2020: EF 60-65, no RWMA, GR 1 DD, GLS  -21.6%, normal RV SF, trivial MR    COVID-19    DDD (degenerative disc disease), cervical    DDD (degenerative disc disease), lumbar    Diabetes mellitus    Fibromyalgia    Gall stones    GERD (gastroesophageal reflux disease)    gastritis   Hemorrhoids    Hypertension    Interstitial cystitis    MCI (mild cognitive impairment) 11/25/2017   Memory difficulty 12/14/2013   Neuromuscular disorder (HCC)    sclerosis   Osteoporosis    Peptic ulcer    PONV (postoperative nausea and vomiting)    Pseudogout    Raynaud phenomenon    Rectal bleeding    Sleep apnea    a. on cpap.   SVT (supraventricular tachycardia)    Syncope 09/10/2015   Thyroid disease    hypothyroidism   Tremor, essential 05/14/2016   Patient Active Problem List   Diagnosis Date Noted   Palpitations 06/02/2022   Pernicious anemia 09/11/2020   Cyst of skin and subcutaneous tissue 09/11/2020   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   Weakness of both lower extremities 06/11/2020   Hyponatremia 05/22/2020   Gait instability 06/20/2019   Facet arthritis of lumbar region 05/10/2018   MCI (mild cognitive impairment) 11/25/2017   Restrictive lung disease 07/30/2017   Mixed hyperlipidemia 06/18/2017   Rectocele 04/26/2017   Irritable bowel syndrome with both constipation and diarrhea 04/26/2017   Osteoporosis 04/18/2017   Trochanteric bursitis of  both hips 02/24/2017   Stress fracture of left tibia 11/05/2016   Inflammatory arthritis 09/30/2016   High risk medication use 09/30/2016   History of Clostridium difficile colitis 09/30/2016   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   Pseudogout 09/30/2016   DDD cervical spine status post fusion 09/30/2016   DDD thoracic spine 09/30/2016   H/O total knee replacement, right 09/30/2016   Age-related osteoporosis without current pathological fracture 09/30/2016   Tremor, essential 05/14/2016   Right foot pain 04/14/2016   B12 deficiency 09/10/2015   Syncope 09/10/2015    Cervical facet joint syndrome 09/10/2015   Chronic fatigue 09/10/2015   Aortic atherosclerosis ([name]) 04/01/2015   DOE (dyspnea on exertion)    Primary osteoarthritis of right knee 08/13/2014   Benign head tremor 06/11/2014   Lumbar degenerative disc disease 06/11/2014   IFG (impaired fasting glucose) 03/09/2014   Hemorrhoid 03/09/2014   Retinal wrinkling, right eye 03/09/2014   Fibromyalgia 03/09/2014   GERD (gastroesophageal reflux disease) 03/09/2014   History of arthroplasty of right knee 01/31/2014   Memory difficulty 12/14/2013   Hemorrhoids, external, thrombosed 06/22/2011   Hypertension 03/11/2011   SVT (supraventricular tachycardia)    Raynaud phenomenon    EDEMA 08/25/2010   Nonspecific (abnormal) findings on radiological and other examination of body structure 08/25/2010   COMPUTERIZED TOMOGRAPHY, CHEST, ABNORMAL 08/25/2010   OSA (obstructive sleep apnea) 04/24/2009   Allergic rhinitis 06/11/2008   Dyspnea 06/11/2008   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 Authorizing Provider  furosemide (LASIX) 20 MG tablet Take 1 tablet (20 mg total) by mouth daily. 09/19/22 10/19/22 Yes [name], Mia Sessions, MD  albuterol (VENTOLIN HFA) 108 (90 Base) MCG/ACT inhaler Inhale 2 puffs into the lungs as needed. 10/17/21   [provider]  amLODipine (NORVASC) 2.5 MG tablet Take 1 tablet (2.5 mg total) by mouth every evening. 09/15/22   Mia Marry, MD  colestipol (COLESTID) 1 g tablet Take 1 g by mouth 2 (two) times daily.    [provider]  Cyanocobalamin (VITAMIN B-12 IJ) Inject 1 mL as directed every 30 (thirty) days.     [provider]  donepezil (ARICEPT) 10 MG tablet TAKE ONE TABLET BY MOUTH EVERY NIGHT AT BEDTIME 11/13/21   Rabalais Givens, NP  gabapentin (NEURONTIN) 300 MG capsule TAKE 1 TO 2 CAPSULES BY MOUTH UP TO TWO TIMES DAILY AS NEEDED Patient taking differently: Take 300 mg by mouth 2 (two) times daily. 10/24/19   Mia Marry, MD  ibuprofen (ADVIL) 600 MG tablet ibuprofen 600 mg tablet  TAKE 1 TABLET BY MOUTH EVERY 6 HOURS AS NEEDED    [provider]  ipratropium (ATROVENT) 0.03 % nasal spray Place 2 sprays into both nostrils every 12 (twelve) hours. 12/11/20   [name], PA-C  leflunomide (ARAVA) 20 MG tablet TAKE 1 TABLET BY MOUTH EVERY DAY 08/03/22   [name], PA-C  Loratadine (CLARITIN PO) Take by mouth.    [provider]  Melatonin 10 MG CAPS Take 10 capsules by mouth daily.    [provider]  memantine (NAMENDA) 10 MG tablet Take 1 tablet (10 mg total) by mouth 2 (two) times daily. 11/13/21   Bryant Givens, NP  metoprolol tartrate (LOPRESSOR) 100 MG tablet 1 AND 1/2 TABLET BY MOUTH 2 TWICE DAILY Patient taking differently:  Take 100 mg by mouth 2 (two) times daily. 07/01/22   Evans Lance, MD  ondansetron (ZOFRAN) 4 MG tablet Take by mouth as needed. 11/25/21   [provider]  pantoprazole (PROTONIX) 40 MG tablet Take 40 mg by mouth 2 (two) times daily.    [provider]  polyethylene glycol powder (GLYCOLAX/MIRALAX) 17 GM/SCOOP powder Take 17 g by mouth daily as needed for mild constipation or moderate constipation.     [provider]  primidone (MYSOLINE) 50 MG tablet Take 1 tablet (50 mg total) by mouth at bedtime. 05/14/22   Gonzaga Givens, NP  valsartan (DIOVAN) 160 MG tablet Take 1 tablet (160 mg total) by mouth 2 (two) times daily. 05/18/22   Nahser, Wonda Cheng, MD  venlafaxine XR (EFFEXOR-XR) 75 MG 24 hr capsule TAKE ONE CAPSULE BY MOUTH DAILY WITH BREAFAST 11/13/21   Buczkowski Givens, NP  zoledronic acid (RECLAST) 5 MG/100ML SOLN injection  01/07/22   [provider]                                                                                                                                     Allergies Codeine, Myrbetriq  [mirabegron], Nystatin, Percocet [oxycodone-acetaminophen], Propoxyphene, Oxycodone-acetaminophen, Celebrex [celecoxib], Darvocet [propoxyphene n-acetaminophen], Erythromycin, Hydrocodone, Lyrica [pregabalin], Macrodantin [nitrofurantoin], Toradol [ketorolac tromethamine], Tramadol, and Verapamil  Review of Systems Review of Systems As noted in HPI  Physical Exam Vital Signs  I have reviewed the triage vital signs BP 131/74   Pulse 65   Temp 98.1 F (36.7 C) (Oral)   Resp 17   Ht '5\' 6"'$  (1.676 m)   Wt 78.5 kg   SpO2 94%   BMI 27.92 kg/m   Physical Exam Vitals reviewed.  Constitutional:      General: She is not in acute distress.    Appearance: She is well-developed. She is not diaphoretic.  HENT:     Head: Normocephalic and atraumatic.     Nose: Nose normal.  Eyes:     General: No scleral icterus.       Right eye: No discharge.        Left eye: No discharge.     Conjunctiva/sclera: Conjunctivae normal.     Pupils: Pupils are equal, round, and reactive to light.  Cardiovascular:     Rate and Rhythm: Normal rate and regular rhythm.     Heart sounds: No murmur heard.    No friction rub. No gallop.  Pulmonary:     Effort: Pulmonary effort is normal. No respiratory distress.     Breath sounds: Normal breath sounds. No stridor. No rales.  Abdominal:     General: There is no distension.     Palpations: Abdomen is soft.     Tenderness: There is no abdominal tenderness.  Musculoskeletal:        General: No tenderness.     Cervical back: Normal range of motion and neck  supple.  Skin:    General: Skin is warm and dry.     Findings: No erythema or rash.  Neurological:     Mental Status: She is alert and oriented to person, place, and time.     Cranial Nerves: Cranial nerves 2-12 are intact.     Sensory: Sensation is intact.     Motor: Motor function is intact.      ED Results and Treatments Labs (all labs ordered are listed, but only abnormal results are displayed) Labs Reviewed  CBC WITH DIFFERENTIAL/PLATELET - Abnormal; Notable for the following components:      Result Value   Hemoglobin 11.3 (*)    HCT 35.5 (*)    Platelets 508 (*)    All other components within normal limits  BASIC METABOLIC PANEL - Abnormal; Notable for the following components:   Glucose, Bld 121 (*)    BUN 7 (*)    All other components within normal limits  BRAIN NATRIURETIC PEPTIDE - Abnormal; Notable for the following components:   B Natriuretic Peptide 182.6 (*)    All other components within normal limits  HEPATIC FUNCTION PANEL - Abnormal; Notable for the following components:   Total Protein 6.4 (*)    Albumin 2.9 (*)    Alkaline Phosphatase 137 (*)    Indirect Bilirubin 0.1 (*)    All other components within normal limits                                                                                                                         EKG  EKG Interpretation  Date/Time:  Saturday September 19 2022 00:42:04 EST Ventricular Rate:  65 PR Interval:  145 QRS Duration: 94 QT Interval:  408 QTC Calculation: 425 R Axis:   60 Text Interpretation: Sinus rhythm Confirmed by Addison Lank [phone]) on 09/19/2022 1:10:09 AM       Radiology DG Chest Port 1 View  Result Date: 09/19/2022 CLINICAL DATA:  Elevated blood pressure and shortness of breath. EXAM: PORTABLE CHEST 1 VIEW COMPARISON:  February 22, 2020 FINDINGS: The heart size and mediastinal contours are within normal limits. Both lungs are clear. A radiopaque fusion plate and screws are seen overlying the lower cervical spine. The visualized skeletal structures are unremarkable. IMPRESSION: No active disease. Electronically Signed   By: Virgina Norfolk M.D.   On: 09/19/2022 01:24    Medications Ordered in ED Medications - No data to display  Procedures Procedures  (including critical care time)  Medical Decision Making / ED Course   Medical Decision Making Amount and/or Complexity of Data Reviewed Labs: ordered. Decision-making details documented in ED Course. Radiology: ordered and independent interpretation performed. Decision-making details documented in ED Course. ECG/medicine tests: ordered and independent interpretation performed. Decision-making details documented in ED Course.    Patient presents with elevated blood pressure readings at home.  Initial blood pressure here was in the 170s. She is currently asymptomatic but did report dyspnea on exertion Given her history of diastolic heart dysfunction, will get screening labs to rule out CHF exacerbation.  EKG without acute ischemic changes, dysrhythmias or blocks. Chest x-ray without evidence of pneumonia, pneumothorax, pulmonary edema or pleural effusions.  CBC without leukocytosis.  Stable hemoglobin. Metabolic panel without significant electrolyte derangements or renal sufficiency. BNP slightly elevated but less than 200  Ambulated and patient had mild shortness of breath without hypoxia.  We will start patient on low-dose Lasix which will also assist with patient's blood pressure.  Patient reports that she has previously been on diuretic in the past and has tolerated well.  Not currently on any at the moment.  Recommended close follow-up with primary care provider      Final Clinical Impression(s) / ED Diagnoses Final diagnoses:  Other secondary hypertension  Diastolic dysfunction   The patient appears reasonably screened and/or stabilized for discharge and I doubt any other medical condition or other [facility] requiring further screening, evaluation, or treatment in the ED at this time. I have discussed the findings, Dx and Tx plan with the patient/family who expressed understanding and  agree(s) with the plan. Discharge instructions discussed at length. The patient/family was given strict return precautions who verbalized understanding of the instructions. No further questions at time of discharge.  Disposition: Discharge  Condition: Good  ED Discharge Orders          Ordered    furosemide (LASIX) 20 MG tablet  Daily        09/19/22 0559              Follow Up: Mia Marry, MD [address] [place] [address] [phone]  Call  to schedule an appointment for close follow up           This chart was dictated using voice recognition software.  Despite best efforts to proofread,  errors can occur which can change the documentation meaning.    Fatima Blank, MD 09/19/22 [phone]

## 2022-09-19 NOTE — ED Notes (Addendum)
This RN called lab regarding tubes sent at 0118. Lab confirmed they received tubes and will be in process soon

## 2022-09-21 ENCOUNTER — Other Ambulatory Visit (INDEPENDENT_AMBULATORY_CARE_PROVIDER_SITE_OTHER): Payer: Medicare Other | Admitting: Neurology

## 2022-09-21 DIAGNOSIS — D510 Vitamin B12 deficiency anemia due to intrinsic factor deficiency: Secondary | ICD-10-CM | POA: Diagnosis not present

## 2022-09-21 DIAGNOSIS — I10 Essential (primary) hypertension: Secondary | ICD-10-CM | POA: Diagnosis not present

## 2022-09-21 DIAGNOSIS — R197 Diarrhea, unspecified: Secondary | ICD-10-CM

## 2022-09-21 DIAGNOSIS — I5032 Chronic diastolic (congestive) heart failure: Secondary | ICD-10-CM | POA: Diagnosis not present

## 2022-09-21 DIAGNOSIS — R7301 Impaired fasting glucose: Secondary | ICD-10-CM | POA: Diagnosis not present

## 2022-09-21 LAB — HEMOCCULT GUIAC POC 1CARD (OFFICE)
Card #1 Date: 11242023
Fecal Occult Blood, POC: NEGATIVE

## 2022-09-22 ENCOUNTER — Encounter: Payer: Self-pay | Admitting: Family Medicine

## 2022-09-22 NOTE — Progress Notes (Signed)
HI Anayia your are negative for C diff!! This is great news!!!!  The additional culture is still pending.  I hope you are feeling some better.

## 2022-09-23 LAB — C. DIFFICILE GDH AND TOXIN A/B
GDH ANTIGEN: NOT DETECTED
MICRO NUMBER:: 14234538
SPECIMEN QUALITY:: ADEQUATE
TOXIN A AND B: NOT DETECTED

## 2022-09-23 LAB — GASTROINTESTINAL PATHOGEN PNL

## 2022-09-23 NOTE — Progress Notes (Signed)
Hi [name], the additional stool test was canceled.  So if you are still having diarrhea please let me know as we may need to recollect that particular specimen.

## 2022-09-24 NOTE — Progress Notes (Unsigned)
Office Visit Note  Patient: Mia [name]             Date of Birth: 1951-08-09           MRN: [id number]             PCP: Hali Marry, MD Referring: Hali Marry, * Visit Date: 10/06/2022 Occupation: '@GUAROCC'$ @  Subjective:  Lower back pain   History of Present Illness: Mia [name] is a 71 y.o. female with history of seronegative rheumatoid arthritis, DDD, and osteoporosis.  She is taking arava 20 mg 1 tablet by mouth daily.  She is tolerating Arava without any side effects and has not missed any doses recently.  Patient denies any recent rheumatoid arthritis flares.  She continues to experience chronic pain in her lower back which is exacerbated by bending and twisting. Yesterday she was more active than usual and this morning she had difficulty getting out of bed.  She takes tylenol and aleve as needed for pain relief.  She remains on gabapentin as prescribed.  She presents today with discomfort in her lower back and on the lateral aspect of her left hip consistent with trochanteric bursitis.  She requested a left trochanteric bursa injection today.  Of note she was seen in the ED on 09/19/22 for high blood pressure.  At that time her BP was in the 220s/100s.  She was treated with IV hydralazine.   She continues to experience intermittent muscle fatigue and muscle tenderness secondary to fibromyalgia.  She received her first IV reclast infusion on 01/06/22. She continues to experience intermittent falls but no recent fractures.      Activities of Daily Living:  Patient reports morning stiffness for 15-20 minutes.   Patient Reports nocturnal pain.  Difficulty dressing/grooming: Denies Difficulty climbing stairs: Reports Difficulty getting out of chair: Reports Difficulty using hands for taps, buttons, cutlery, and/or writing: Denies  Review of Systems  Constitutional:  Positive for fatigue.  HENT:  Positive for mouth dryness. Negative for mouth sores.   Eyes:   Positive for dryness.  Respiratory:  Negative for shortness of breath.   Cardiovascular:  Negative for chest pain and palpitations.  Gastrointestinal:  Positive for constipation and diarrhea. Negative for blood in stool.  Endocrine: Positive for increased urination.  Genitourinary:  Negative for involuntary urination.  Musculoskeletal:  Positive for joint pain, gait problem, joint pain, joint swelling, myalgias, muscle weakness, morning stiffness, muscle tenderness and myalgias.  Skin:  Positive for color change. Negative for rash, hair loss and sensitivity to sunlight.  Allergic/Immunologic: Negative for susceptible to infections.  Neurological:  Positive for dizziness and headaches.  Hematological:  Negative for swollen glands.  Psychiatric/Behavioral:  Negative for depressed mood and sleep disturbance. The patient is not nervous/anxious.     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   Weakness of both lower extremities 06/11/2020   Hyponatremia 05/22/2020   Gait instability 06/20/2019   Facet arthritis of lumbar region 05/10/2018   MCI (mild cognitive impairment) 11/25/2017   Restrictive lung disease 07/30/2017   Mixed hyperlipidemia 06/18/2017   Rectocele 04/26/2017   Irritable bowel syndrome with both constipation and diarrhea 04/26/2017   Osteoporosis 04/18/2017   Trochanteric bursitis of both hips 02/24/2017   Stress fracture of left tibia 11/05/2016   Inflammatory arthritis 09/30/2016  High risk medication use 09/30/2016   History of Clostridium difficile colitis 09/30/2016   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   Pseudogout 09/30/2016   DDD cervical spine status post fusion 09/30/2016   DDD thoracic spine 09/30/2016   H/O total knee replacement, right 09/30/2016   Age-related osteoporosis without  current pathological fracture 09/30/2016   Tremor, essential 05/14/2016   Right foot pain 04/14/2016   B12 deficiency 09/10/2015   Syncope 09/10/2015   Cervical facet joint syndrome 09/10/2015   Chronic fatigue 09/10/2015   Aortic atherosclerosis ([place]) 04/01/2015   DOE (dyspnea on exertion)    Primary osteoarthritis of right knee 08/13/2014   Benign head tremor 06/11/2014   Lumbar degenerative disc disease 06/11/2014   IFG (impaired fasting glucose) 03/09/2014   Hemorrhoid 03/09/2014   Retinal wrinkling, right eye 03/09/2014   Fibromyalgia 03/09/2014   GERD (gastroesophageal reflux disease) 03/09/2014   History of arthroplasty of right knee 01/31/2014   Memory difficulty 12/14/2013   Hemorrhoids, external, thrombosed 06/22/2011   Hypertension 03/11/2011   SVT (supraventricular tachycardia)    Raynaud phenomenon    EDEMA 08/25/2010   Nonspecific (abnormal) findings on radiological and other examination of body structure 08/25/2010   COMPUTERIZED TOMOGRAPHY, CHEST, ABNORMAL 08/25/2010   OSA (obstructive sleep apnea) 04/24/2009   Allergic rhinitis 06/11/2008   Dyspnea 06/11/2008   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   Chronic diastolic CHF (congestive heart failure) ([place]) 06/08/2008   INTERSTITIAL CYSTITIS 06/08/2008    Past Medical History:  Diagnosis Date   Anal fissure    Anemia    Arthritis    Blood transfusion    C. difficile colitis    Chest pain    a. 01/2013 MV: EF 59%, no ischemia.   Chronic Dyspnea    a. 01/2013 Echo: EF 60-65%, Gr 1 DD, PASP 16mHg.// Echo 01/2020: EF 60-65, no RWMA, GR 1 DD, GLS -21.6%, normal RV SF, trivial MR    COVID-19    DDD (degenerative disc disease), cervical    DDD (degenerative disc disease), lumbar    Diabetes mellitus    Fibromyalgia    Gall stones    GERD (gastroesophageal reflux disease)    gastritis   Hemorrhoids    Hypertension    Interstitial cystitis    MCI (mild cognitive impairment) 11/25/2017   Memory  difficulty 12/14/2013   Neuromuscular disorder (H[facility])    sclerosis   Osteoporosis    Peptic ulcer    PONV (postoperative nausea and vomiting)    Pseudogout    Raynaud phenomenon    Rectal bleeding    Sleep apnea    a. on cpap.   SVT (supraventricular tachycardia)    Syncope 09/10/2015   Thyroid disease    hypothyroidism   Tremor, essential 05/14/2016    Family History  Problem Relation Age of Onset   Heart attack Mother    Stroke Mother    Diabetes Mother    Heart disease Mother    Colon polyps Mother    Asthma Mother    Dementia Mother    Heart disease Father    Heart attack Father    Asthma Father    Stroke Sister    Hypertension Sister    Rheum arthritis Sister    Dementia Sister    Asthma Sister    Parkinson's disease Sister    Lupus Sister    Asthma Sister    Breast cancer Maternal Grandmother    Wilson's disease Maternal Grandmother    Pancreatic cancer Maternal Grandfather  Past Surgical History:  Procedure Laterality Date   APPENDECTOMY     BLADDER SURGERY     x2   BLADDER SURGERY     BREAST BIOPSY     CARDIAC CATHETERIZATION     CARPAL TUNNEL RELEASE     CATARACT EXTRACTION Bilateral    CHOLECYSTECTOMY     DILATION AND CURETTAGE OF UTERUS     ENTEROCELE REPAIR     x2   EYE SURGERY     retina   herniated disc  11/25/2021   L4 and L5   hysterectomy - unknown type     JOINT REPLACEMENT     KNEE ARTHROPLASTY     KNEE SURGERY     x6   NECK SURGERY     fusion   OOPHORECTOMY     QUADRICEPS REPAIR Right    RECTOCELE REPAIR     x2   SHOULDER ARTHROSCOPY WITH SUBACROMIAL DECOMPRESSION, ROTATOR CUFF REPAIR AND BICEP TENDON REPAIR  10/06/2012   Procedure: SHOULDER ARTHROSCOPY WITH SUBACROMIAL DECOMPRESSION, ROTATOR CUFF REPAIR AND BICEP TENDON REPAIR;  Surgeon: Nita Sells, MD;  Location: [facility];  Service: Orthopedics;  Laterality: Right;  Arthroscopic  Repair  of  Subscapularis, Open Biceps Tenodesis   SHOULDER  SURGERY     bilateral- bones spur   SHOULDER SURGERY Left 04/15/2021   TONSILLECTOMY     TOTAL KNEE ARTHROPLASTY     TOTAL SHOULDER ARTHROPLASTY     Social History   Social History Narrative   Lives with her husband. She likes to hang out with her friends and play games and do bible study.   Immunization History  Administered Date(s) Administered   Fluad Quad(high Dose 65+) 07/13/2019, 07/10/2020, 07/22/2021, 07/22/2022   Hepatitis B, adult 02/25/2021, 03/28/2021, 08/26/2021   Influenza Split 07/26/2012, 06/11/2014, 07/25/2015, 06/23/2018   Influenza Whole 07/30/2009, 08/19/2010   Influenza, High Dose Seasonal PF 07/06/2016, 07/05/2017   Influenza,inj,Quad PF,6+ Mos 06/11/2014, 07/25/2015, 06/23/2018   Influenza-Unspecified 07/26/2012, 07/26/2013, 09/05/2013, 06/11/2014, 07/25/2015, 06/23/2018, 06/27/2019, 07/13/2019, 07/10/2020, 07/22/2021   Moderna Sars-Covid-2 Vaccination 02/29/2020, 03/28/2020, 10/04/2020   Pneumococcal Conjugate-13 01/04/2017   Pneumococcal Polysaccharide-23 07/30/2009, 11/14/2018   Pneumococcal-Unspecified 10/27/2015   Tdap 02/13/2008, 05/12/2018   Zoster, Live 09/01/2011     Objective: Vital Signs: BP 103/66 (BP Location: Right Arm, Patient Position: Sitting, Cuff Size: Normal)   Pulse 68   Resp 15   Ht '5\' 6"'$  (1.676 m)   Wt 173 lb 3.2 oz (78.6 kg)   BMI 27.96 kg/m    Physical Exam Vitals and nursing note reviewed.  Constitutional:      Appearance: She is well-developed.  HENT:     Head: Normocephalic and atraumatic.  Eyes:     Conjunctiva/sclera: Conjunctivae normal.  Cardiovascular:     Rate and Rhythm: Normal rate and regular rhythm.     Heart sounds: Normal heart sounds.  Pulmonary:     Effort: Pulmonary effort is normal.     Breath sounds: Normal breath sounds.  Abdominal:     General: Bowel sounds are normal.     Palpations: Abdomen is soft.  Musculoskeletal:     Cervical back: Normal range of motion.  Skin:    General: Skin is  warm and dry.     Capillary Refill: Capillary refill takes less than 2 seconds.  Neurological:     Mental Status: She is alert and oriented to person, place, and time.  Psychiatric:        Behavior: Behavior normal.  Musculoskeletal Exam: Generalized hyperalgesia and positive tender points on examination.  C-spine has slightly limited range of motion with lateral rotation.  Painful range of motion of the lumbar spine.  Tenderness in the lumbar region noted.  Shoulder joint stiffness with range of motion bilaterally.  Elbow joints have good range of motion with no tenderness or inflammation.  Wrist joints, MCPs, PIPs, DIPs have good range of motion with no synovitis.  She has PIP and DIP thickening consistent with osteoarthritis of both hands.  Hip joints have good range of motion bilaterally.  Tenderness over the left trochanteric bursa.  Right knee replacement has good ROM.  Left knee has good ROM with no warmth or effusion.  Ankle joints have good ROM with no tenderness or synovitis.   CDAI Exam: CDAI Score: -- Patient Global: --; Provider Global: -- Swollen: --; Tender: -- Joint Exam 10/06/2022   No joint exam has been documented for this visit   There is currently no information documented on the homunculus. Go to the Rheumatology activity and complete the homunculus joint exam.  Investigation: No additional findings.  Imaging: DG Chest Port 1 View  Result Date: 09/19/2022 CLINICAL DATA:  Elevated blood pressure and shortness of breath. EXAM: PORTABLE CHEST 1 VIEW COMPARISON:  February 22, 2020 FINDINGS: The heart size and mediastinal contours are within normal limits. Both lungs are clear. A radiopaque fusion plate and screws are seen overlying the lower cervical spine. The visualized skeletal structures are unremarkable. IMPRESSION: No active disease. Electronically Signed   By: Virgina Norfolk M.D.   On: 09/19/2022 01:24    Recent Labs: Lab Results  Component Value Date    WBC 7.7 09/19/2022   HGB 11.3 (L) 09/19/2022   PLT 508 (H) 09/19/2022   NA 138 09/19/2022   K 3.9 09/19/2022   CL 100 09/19/2022   CO2 25 09/19/2022   GLUCOSE 121 (H) 09/19/2022   BUN 7 (L) 09/19/2022   CREATININE 0.85 09/19/2022   BILITOT 0.3 09/19/2022   ALKPHOS 137 (H) 09/19/2022   AST 38 09/19/2022   ALT 32 09/19/2022   PROT 6.4 (L) 09/19/2022   ALBUMIN 2.9 (L) 09/19/2022   CALCIUM 9.7 09/19/2022   GFRAA 94 03/10/2021    Speciality Comments: No specialty comments available.  Procedures:  No procedures performed Allergies: Codeine, Myrbetriq  [mirabegron], Nystatin, Percocet [oxycodone-acetaminophen], Propoxyphene, Oxycodone-acetaminophen, Celebrex [celecoxib], Darvocet [propoxyphene n-acetaminophen], Erythromycin, Hydrocodone, Lyrica [pregabalin], Macrodantin [nitrofurantoin], Toradol [ketorolac tromethamine], Tramadol, and Verapamil      Assessment / Plan:     Visit Diagnoses: Seronegative rheumatoid arthritis ([place]): She has no synovitis on examination today.  Her rheumatoid arthritis has been well-controlled taking Arava 20 mg 1 tablet by mouth daily.  She is tolerating Arava without any side effects and has not missed any doses recently.  Her morning stiffness lasts 15 to 20 minutes daily.  She has no active inflammation on examination today.  She will remain on [place] as monotherapy.  She was advised to notify us if she develops increased joint pain or joint swelling.  She will follow-up in the office in 5 months or sooner if needed.  High risk medication use - Arava 20 mg 1 tablet by mouth daily. - Plan: CBC with Differential/Platelet, COMPLETE METABOLIC PANEL WITH GFR CBC, BMP, and hepatic function panel updated on 09/19/22.  Patient requested to have updated lab work today.  Orders for CBC and CMP were released.  Her next lab work will be due in March and every 3  months to monitor for drug toxicity. She recently burned her finger and developed a blister which became  infected.  She was treated with IV antibiotics.  Discussed the importance of holding arava if she develops signs or symptoms of an infection and to resume once the infection has completely cleared.   Raynaud's phenomenon without gangrene: Not currently active.  No digital ulcers.  No signs of sclerodactyly.   S/P arthroscopy of left shoulder - by Dr. Norwood Levo on 04/15/2021.  She had left shoulder joint cortisone injection by Dr. Norwood Levo in February 2023.  Trochanteric bursitis, left hip: She has tenderness on the lateral aspect of the left hip consistent with trochanteric bursitis.  Her blood pressure has been significantly elevated recently to the point that she was seen in the emergency department on 09/19/22.  According to the patient while she was being treated and supported by EMS her blood pressure was in the 220s over 100s.  Her blood pressure was well-controlled today in the office but in the evenings her blood pressure has been more elevated.  Discussed that I would recommend checking her blood pressure in the morning and evening for 2 weeks and if her blood pressure is well-controlled she can call us back to schedule a left trochanteric bursa cortisone injection.  H/O total knee replacement, right: Doing well.  Good ROM with no discomfort.    Primary osteoarthritis of left knee: No warmth or effusion.   DDD (degenerative disc disease), cervical: C-spine has slightly limited ROM.  No symptoms of radiculopathy at this time.   DDD (degenerative disc disease), thoracic: No midline spinal tenderness in the thoracic spine.   DDD (degenerative disc disease), lumbar - Status post lumbar microdiscectomy left L4-5 on November 25, 2021.  She is followed by Dr. Lurene Shadow.  Chronic pain.  Her discomfort is exacerbated by twisting and bending.  Yesterday she was more physically active than usual and is having increased discomfort.  She woke up this morning experiencing muscle spasms and had difficulty  walking.  She took Aleve which alleviated her discomfort enough to get to her appointment today.  Prescription for methocarbamol 500 mg 1 tablet daily as needed for muscle spasms was sent to the pharmacy today.  She previously tolerated metacarpal without any side effects.  Discussed potential side effects as well as the importance of avoiding driving or operating machinery after taking methocarbamol.  She voiced understanding.  Advised patient to schedule follow up visit with Dr. Lurene Shadow for further evaluation.  Offered a referral to pain management.  She will notify us when and if she would like the referral placed.   Pseudogout:  No pseudogout flares.  History of foot fracture - History of fifth metatarsal fracture treated by Dr. Sterling Big.  Other osteoporosis without current pathological fracture - Followed by Dr. Kelton Pillar for management of osteoporosis.  Previous therapy alendronate, Prolia and then IV Reclast. Received 1st dose of zoledronic acid 01/06/2022   Fibromyalgia: She continues to experience generalized hyperalgesia and positive tender points on exam.  She experiences intermittent myalgias, muscle spasms, and muscle fatigue.  She remains on gabapentin as prescribed.  She previously took Lyrica which was helpful.  A prescription for methocarbamol 500 mg 1 tablet daily as needed during muscle spasms was sent to the pharmacy.  Offered referral to pain management but she would like to hold off at this time.  Chronic fatigue: Stable.   Other medical conditions are listed as follows:   History of gastroesophageal reflux (GERD)  History  of hypertension: BP was 103/66 today in the office.   History of CHF (congestive heart failure)  IC (interstitial cystitis)  History of Clostridium difficile colitis  Orders: Orders Placed This Encounter  Procedures   CBC with Differential/Platelet   COMPLETE METABOLIC PANEL WITH GFR   Meds ordered this encounter  Medications   methocarbamol  (ROBAXIN) 500 MG tablet    Sig: Take 1 tablet (500 mg total) by mouth daily as needed for muscle spasms.    Dispense:  15 tablet    Refill:  0     Follow-Up Instructions: Return in about 5 months (around 03/07/2023) for Rheumatoid arthritis, DDD, Osteoporosis.   [name], PA-C  Note - This record has been created using Dragon software.  Chart creation errors have been sought, but may not always  have been located. Such creation errors do not reflect on  the standard of medical care.

## 2022-09-28 ENCOUNTER — Ambulatory Visit (INDEPENDENT_AMBULATORY_CARE_PROVIDER_SITE_OTHER): Payer: Medicare Other | Admitting: Family Medicine

## 2022-09-28 VITALS — BP 111/69 | HR 71 | Resp 20

## 2022-09-28 DIAGNOSIS — D510 Vitamin B12 deficiency anemia due to intrinsic factor deficiency: Secondary | ICD-10-CM

## 2022-09-28 MED ORDER — CYANOCOBALAMIN 1000 MCG/ML IJ SOLN
1000.0000 ug | Freq: Once | INTRAMUSCULAR | Status: AC
  Administered 2022-09-28: 1000 ug via INTRAMUSCULAR

## 2022-09-28 NOTE — Progress Notes (Signed)
Agree with documentation as above.   Temari Schooler, MD  

## 2022-09-28 NOTE — Progress Notes (Signed)
Patient here for B12 injection. Denies muscle cramps, weakness or irregular heart rate.  Patient advised to return in 4 weeks for B12 injection.  Location: [facility]

## 2022-10-06 ENCOUNTER — Ambulatory Visit: Payer: Medicare Other | Attending: Physician Assistant | Admitting: Physician Assistant

## 2022-10-06 ENCOUNTER — Encounter: Payer: Self-pay | Admitting: Physician Assistant

## 2022-10-06 VITALS — BP 103/66 | HR 68 | Resp 15 | Ht 66.0 in | Wt 173.2 lb

## 2022-10-06 DIAGNOSIS — N3010 Interstitial cystitis (chronic) without hematuria: Secondary | ICD-10-CM | POA: Diagnosis not present

## 2022-10-06 DIAGNOSIS — M5030 Other cervical disc degeneration, unspecified cervical region: Secondary | ICD-10-CM | POA: Diagnosis not present

## 2022-10-06 DIAGNOSIS — M1712 Unilateral primary osteoarthritis, left knee: Secondary | ICD-10-CM

## 2022-10-06 DIAGNOSIS — Z8719 Personal history of other diseases of the digestive system: Secondary | ICD-10-CM

## 2022-10-06 DIAGNOSIS — M0600 Rheumatoid arthritis without rheumatoid factor, unspecified site: Secondary | ICD-10-CM

## 2022-10-06 DIAGNOSIS — Z8679 Personal history of other diseases of the circulatory system: Secondary | ICD-10-CM | POA: Diagnosis not present

## 2022-10-06 DIAGNOSIS — Z8781 Personal history of (healed) traumatic fracture: Secondary | ICD-10-CM

## 2022-10-06 DIAGNOSIS — R5382 Chronic fatigue, unspecified: Secondary | ICD-10-CM

## 2022-10-06 DIAGNOSIS — M5136 Other intervertebral disc degeneration, lumbar region: Secondary | ICD-10-CM | POA: Insufficient documentation

## 2022-10-06 DIAGNOSIS — M7062 Trochanteric bursitis, left hip: Secondary | ICD-10-CM | POA: Diagnosis not present

## 2022-10-06 DIAGNOSIS — Z8619 Personal history of other infectious and parasitic diseases: Secondary | ICD-10-CM | POA: Diagnosis not present

## 2022-10-06 DIAGNOSIS — M5134 Other intervertebral disc degeneration, thoracic region: Secondary | ICD-10-CM | POA: Diagnosis not present

## 2022-10-06 DIAGNOSIS — Z96651 Presence of right artificial knee joint: Secondary | ICD-10-CM

## 2022-10-06 DIAGNOSIS — M1120 Other chondrocalcinosis, unspecified site: Secondary | ICD-10-CM | POA: Diagnosis not present

## 2022-10-06 DIAGNOSIS — M51369 Other intervertebral disc degeneration, lumbar region without mention of lumbar back pain or lower extremity pain: Secondary | ICD-10-CM

## 2022-10-06 DIAGNOSIS — Z79899 Other long term (current) drug therapy: Secondary | ICD-10-CM | POA: Diagnosis not present

## 2022-10-06 DIAGNOSIS — I7300 Raynaud's syndrome without gangrene: Secondary | ICD-10-CM

## 2022-10-06 DIAGNOSIS — M797 Fibromyalgia: Secondary | ICD-10-CM | POA: Diagnosis not present

## 2022-10-06 DIAGNOSIS — Z9889 Other specified postprocedural states: Secondary | ICD-10-CM | POA: Diagnosis not present

## 2022-10-06 DIAGNOSIS — M818 Other osteoporosis without current pathological fracture: Secondary | ICD-10-CM

## 2022-10-06 MED ORDER — METHOCARBAMOL 500 MG PO TABS: 500.0000 mg | ORAL_TABLET | Freq: Every day | ORAL | 0 refills | Status: DC | PRN

## 2022-10-06 NOTE — Patient Instructions (Signed)
Standing Labs We placed an order today for your standing lab work.   Please have your standing labs drawn in March and every 3 months  Please have your labs drawn 2 weeks prior to your appointment so that the provider can discuss your lab results at your appointment.  Please note that you may see your imaging and lab results in MyChart before we have reviewed them. We will contact you once all results are reviewed. Please allow our office up to 72 hours to thoroughly review all of the results before contacting the office for clarification of your results.  Lab hours are:   Monday through Thursday from 8:00 am -12:30 pm and 1:00 pm-5:00 pm and Friday from 8:00 am-12:00 pm.  Please be advised, all patients with office appointments requiring lab work will take precedent over walk-in lab work.   Labs are drawn by [facility]. Please bring your co-pay at the time of your lab draw.  You may receive a bill from [facility] for your lab work.  Please note if you are on Hydroxychloroquine and and an order has been placed for a Hydroxychloroquine level, you will need to have it drawn 4 hours or more after your last dose.  If you wish to have your labs drawn at another location, please call the office 24 hours in advance so we can fax the orders.  The office is located at [address], [address], [place], [place] [address] No appointment is necessary.    If you have any questions regarding directions or hours of operation,  please call [phone].   As a reminder, please drink plenty of water prior to coming for your lab work. Thanks!  

## 2022-10-07 LAB — CBC WITH DIFFERENTIAL/PLATELET
Absolute Monocytes: 601 cells/uL (ref 200–950)
Basophils Absolute: 59 cells/uL (ref 0–200)
Basophils Relative: 0.9 %
Eosinophils Absolute: 191 cells/uL (ref 15–500)
Eosinophils Relative: 2.9 %
HCT: 37.5 % (ref 35.0–45.0)
Hemoglobin: 11.9 g/dL (ref 11.7–15.5)
Lymphs Abs: 1419 cells/uL (ref 850–3900)
MCH: 28.1 pg (ref 27.0–33.0)
MCHC: 31.7 g/dL — ABNORMAL LOW (ref 32.0–36.0)
MCV: 88.7 fL (ref 80.0–100.0)
MPV: 10.9 fL (ref 7.5–12.5)
Monocytes Relative: 9.1 %
Neutro Abs: 4330 cells/uL (ref 1500–7800)
Neutrophils Relative %: 65.6 %
Platelets: 376 10*3/uL (ref 140–400)
RBC: 4.23 10*6/uL (ref 3.80–5.10)
RDW: 13.4 % (ref 11.0–15.0)
Total Lymphocyte: 21.5 %
WBC: 6.6 10*3/uL (ref 3.8–10.8)

## 2022-10-07 LAB — COMPLETE METABOLIC PANEL WITH GFR
AG Ratio: 1.3 (calc) (ref 1.0–2.5)
ALT: 29 U/L (ref 6–29)
AST: 33 U/L (ref 10–35)
Albumin: 3.8 g/dL (ref 3.6–5.1)
Alkaline phosphatase (APISO): 113 U/L (ref 37–153)
BUN: 8 mg/dL (ref 7–25)
CO2: 27 mmol/L (ref 20–32)
Calcium: 9.9 mg/dL (ref 8.6–10.4)
Chloride: 103 mmol/L (ref 98–110)
Creat: 0.75 mg/dL (ref 0.60–1.00)
Globulin: 2.9 g/dL (calc) (ref 1.9–3.7)
Glucose, Bld: 127 mg/dL — ABNORMAL HIGH (ref 65–99)
Potassium: 4.7 mmol/L (ref 3.5–5.3)
Sodium: 139 mmol/L (ref 135–146)
Total Bilirubin: 0.5 mg/dL (ref 0.2–1.2)
Total Protein: 6.7 g/dL (ref 6.1–8.1)
eGFR: 85 mL/min/{1.73_m2} (ref >=60)

## 2022-10-07 NOTE — Progress Notes (Signed)
Glucose is 127. Rest of CMP WNL.  CBC WNL. No medication changes recommended at this time.

## 2022-10-12 DIAGNOSIS — M545 Low back pain, unspecified: Secondary | ICD-10-CM | POA: Diagnosis not present

## 2022-10-14 DIAGNOSIS — M479 Spondylosis, unspecified: Secondary | ICD-10-CM | POA: Diagnosis not present

## 2022-10-14 DIAGNOSIS — M259 Joint disorder, unspecified: Secondary | ICD-10-CM | POA: Diagnosis not present

## 2022-10-14 DIAGNOSIS — M5451 Vertebrogenic low back pain: Secondary | ICD-10-CM | POA: Diagnosis not present

## 2022-10-14 DIAGNOSIS — M1388 Other specified arthritis, other site: Secondary | ICD-10-CM | POA: Diagnosis not present

## 2022-10-15 DIAGNOSIS — M533 Sacrococcygeal disorders, not elsewhere classified: Secondary | ICD-10-CM | POA: Insufficient documentation

## 2022-10-27 ENCOUNTER — Ambulatory Visit (INDEPENDENT_AMBULATORY_CARE_PROVIDER_SITE_OTHER): Payer: Medicare Other | Admitting: Family Medicine

## 2022-10-27 VITALS — BP 120/57 | HR 70 | Ht 66.0 in | Wt 180.0 lb

## 2022-10-27 DIAGNOSIS — E538 Deficiency of other specified B group vitamins: Secondary | ICD-10-CM

## 2022-10-27 MED ORDER — CYANOCOBALAMIN 1000 MCG/ML IJ SOLN
1000.0000 ug | Freq: Once | INTRAMUSCULAR | Status: AC
  Administered 2022-10-27: 1000 ug via INTRAMUSCULAR

## 2022-10-27 NOTE — Progress Notes (Signed)
Agree with documentation as above.  Is due for B12 blood draw before next injection.  [name], MD

## 2022-10-27 NOTE — Progress Notes (Signed)
   Subjective:    Patient ID: Mia [name], female    DOB: [birth year], 72 y.o.   MRN: [id number]  HPI Pt here for B12 injection, pt denies muscle cramps, weakness or irregular heart rate.    Review of Systems     Objective:   Physical Exam        Assessment & Plan:   Pt tolerated injection well, [facility], pt advised to return in 42 for next injection.

## 2022-10-29 ENCOUNTER — Other Ambulatory Visit: Payer: Self-pay | Admitting: Physician Assistant

## 2022-10-29 NOTE — Telephone Encounter (Signed)
Next Visit: 03/08/2023  Last Visit: 10/06/2022  Last Fill: 08/03/2022  DX: Seronegative rheumatoid arthritis   Current Dose per office note 10/06/2022:  Arava 20 mg 1 tablet by mouth daily   Labs: 10/06/2022, Glucose is 127. Rest of CMP WNL. CBC WNL. No medication changes recommended at this time.  Okay to refill Arava?

## 2022-11-04 DIAGNOSIS — M1388 Other specified arthritis, other site: Secondary | ICD-10-CM | POA: Diagnosis not present

## 2022-11-04 DIAGNOSIS — M47816 Spondylosis without myelopathy or radiculopathy, lumbar region: Secondary | ICD-10-CM | POA: Diagnosis not present

## 2022-11-04 DIAGNOSIS — M47896 Other spondylosis, lumbar region: Secondary | ICD-10-CM | POA: Diagnosis not present

## 2022-11-04 DIAGNOSIS — M533 Sacrococcygeal disorders, not elsewhere classified: Secondary | ICD-10-CM | POA: Diagnosis not present

## 2022-11-12 ENCOUNTER — Ambulatory Visit: Payer: Medicare Other | Attending: Cardiovascular Disease | Admitting: Cardiovascular Disease

## 2022-11-12 ENCOUNTER — Encounter: Payer: Self-pay | Admitting: Cardiovascular Disease

## 2022-11-12 VITALS — BP 134/76 | HR 65 | Ht 66.0 in | Wt 178.0 lb

## 2022-11-12 DIAGNOSIS — I471 Supraventricular tachycardia, unspecified: Secondary | ICD-10-CM | POA: Diagnosis not present

## 2022-11-12 DIAGNOSIS — I10 Essential (primary) hypertension: Secondary | ICD-10-CM | POA: Insufficient documentation

## 2022-11-12 MED ORDER — AMLODIPINE BESYLATE 5 MG PO TABS: 5.0000 mg | ORAL_TABLET | Freq: Every day | ORAL | 3 refills | Status: DC

## 2022-11-12 NOTE — Progress Notes (Signed)
Cardiology Office Note   Date:  11/12/2022   ID:  Mia [name], DOB Sep 19, 1951, MRN [id number]  PCP:  Hali Marry, MD  Cardiologist:   Mertie Moores, MD   Chief Complaint  Patient presents with   Tachycardia   1. SVT 2. Raynaud's Phenomenon 3. Diabetes Mellitus 4. Minimal CAD by cath 2006 [name]) 5. Sleep apnea - CPAP is currently not working  02/11/05 Right heart catheterization:   RA pressure: 8 mmHg   RV pressure: 25/7 mmHg   PA pressure: 20/9 mmHg   PWCP: 9 mmHg   CO: 4.6 liters per minute  LEFT HEART CATHETERIZATION RESULTS:  1. Left main: No significant disease.   2. LAD: Mild luminal irregularities.   3. First and second diagonal: Moderate size, mild luminal irregularities.   4. Left circumflex: Nondominant, mild luminal irregularities.   5. RCA: Dominant with mild luminal irregularities.   6. LV: EF is 55 to 60%, no wall motion abnormalities. LV EDP was 9 mmHg.   December 28, 2014:  Mia [name] is a 72 y.o. female who presents for follow-up with an episode of chest pain. She was seen by our nurse practitioner, Ignacia Bayley.    Diltiazem was DC'd.   She was started on Metoprolol 50 bid.   Myoview study was normal. She has normal left ventricle systolic function. She is going to the [facility] 3 times a week.  Doing well.     She is feeling much better.    Sept. 1, 2016:  Has had C diff for the past several months .  Doing well from a cardiac standpoint .  Has been fatigued.  Doing well with the metoprolol  Still has shortness of breath .   Nov. 3, 2017:    Mia [name] is seen today with her husband, [name].   Last week she started having some heart facing .  HR was 187 (using the app on her phone )  Could feel it in her ears. Got a little dizzy.  Sat down and relaxed and the episode resolved.  Had another episode this past Sunday .  HR stayed elevated for 2 hours   Aug. 24, 2018: Mia [name] is seen today with husband [name].   Followed for HTN and  hyperlipidemia  Has had some elevated diastolic BP readings . Eats some salty foods still - likes ham. Complains of leg pain / ache when she is standing .    Nov. 16, 2018  Mia [name] was having more SVT, we have increased the metoprolol  Up to metoprolol 100 mg BID. She is much better controlled at this point Wearing the event monitor   December 08, 2017:  Mia [name] is seen today for follow-up of her supraventricular tachycardia  We had increased her metoprolol to 100 mg BID  Has occasional palpitations  No CP ,  No severe dyspnea. Does has some DOE that is likely due to generalized deconditioning  Needs left knee replacement this summer. Needs carple tunnel surgery in both wrists.  Has episodes of diaphoresis   Mar 11, 2018: Mia [name] is seen today for follow-up of her supraventricular tachycardia.  We increased her metoprolol up to 150 mill grams twice a day during her last visit. Tolerating the metoprolol well  BP is still is elevated at times.     Eats lots of sandwiches, salami,  Her diet is "terrible"  According to her . - eats snacks instead of real meals, sandwiches,   She is surprised  that she hasnt lost any weight .   Nov. 4,, 2019:  Mia [name] is here follow-up with her supraventricular tachycardia and hypertension. Palpitations have improved . BP is still elevated.    Avoids salt for the most part  Still eats Nabs crackers regularly   Feb. 9. 2021  Mia [name] is seen today for follow-up visit. She has a history of supraventricular tachycardia.  She has a history of sleep apnea and is on CPAP. She tested positive for Covid on November 07, 2019.  She had symptoms of diarrhea, nausea, vomiting and inability to take p.o. food.  She was in [facility] for 6 days.  BP was elevated in the hospital  Was started on amlodipine.   Doubled her Losartan ( was to take at night)  She was seen by Pecolia Ades, NP on February 1  When she saw [name]'s last week she was having problems with  hypertension.  Amlodipine had been added. She continued to have lots of fatigue.  BP is  Is eating and drinking normally   April 19, 2020:  Mia [name] is seen today for follow-up of her hypertension and history of SVT.  She tested positive for Covid in January, 2021.  She has been gradually improving.  She had significant leg edema and dysnea .  Saw her primary MD who gave her some IV lasix and started HCTZ.  She felt better after diuresing 20-25 lbs.  Still having episodes of tachycardia - despite being on metoprolol 150 mg BID  We discussed referral to EP for further eval of this   Jan. 27, 2022: Mia [name] is seen back for follow up of her SVT and HTN She has been seen by Dr. Lovena Le of EP who determined that she did not seem to be having significant episodes of SVT.   Most of her episodes occurred with exertion ( and were likely sinus tach)   Dr. Lovena Le thinks she might be a good candidate for ablation  Wanted to wait and get BP down.  He added amlodipine and increased losartan  Is avoiding salt  Has lost weight ,   Her appetite has not been very good recently  Has knee arthritis which is limited her now   Has had occasional episodes  Of SVT  Is on a high dose of metoprolol    Jan. 27, 2023 Mia [name] is seen today for follow up of her SVT, HTN Seen with husband , [name]   She had some DOE at her last visit that we thought may be due to generalized deconditioning.  Has had shoulder surgery  Now has a ruptured disc and is going to have surgery Tuesday  Has had  a few episodes of shortness of breath Occasional while sitting ,  Has some palpitations - "hard heart beat " No real exercise with her back issues   Has random episodes of dizziness. Occasionally while walking  Woozy like feeling  Has had an event monitor,  baseline bradycardia Has seen Dr. Lovena Le of EP - she is on a high dose of metoprolol which is controlling her SVT fairly well.  At some point, she may need an SVT ablation  and then we could be able to reduce her dose of metoprolol   November 12, 2022:   Mia [name] is seen today for follow-up of her supraventricular tachycardia.  She has had some deconditioning issues. Has seen Dr. Lovena Le.  They discussed ablation  Dr. Lovena Le changed Losartan to Valsartan for elevated BP  BP  has still been elevated.   Was [place] with severe HTN [place]  Was sent home on hydralazine . Primary MD changed to amlodipine  BP tends to gradually rise through the day   She snacks through the day  ( Nabs )  Eats bagels frequently ( 1/2 bagel in the morning )  Drinks lots of soda  - perhaps 2 liters a day   Encouraged better diet .      Past Medical History:  Diagnosis Date   Anal fissure    Anemia    Arthritis    Blood transfusion    C. difficile colitis    Chest pain    a. 01/2013 MV: EF 59%, no ischemia.   Chronic Dyspnea    a. 01/2013 Echo: EF 60-65%, Gr 1 DD, PASP 65mHg.// Echo 01/2020: EF 60-65, no RWMA, GR 1 DD, GLS -21.6%, normal RV SF, trivial MR    COVID-19    DDD (degenerative disc disease), cervical    DDD (degenerative disc disease), lumbar    Diabetes mellitus    Fibromyalgia    Gall stones    GERD (gastroesophageal reflux disease)    gastritis   Hemorrhoids    Hypertension    Interstitial cystitis    MCI (mild cognitive impairment) 11/25/2017   Memory difficulty 12/14/2013   Neuromuscular disorder (HCC)    sclerosis   Osteoporosis    Peptic ulcer    PONV (postoperative nausea and vomiting)    Pseudogout    Raynaud phenomenon    Rectal bleeding    Sleep apnea    a. on cpap.   SVT (supraventricular tachycardia)    Syncope 09/10/2015   Thyroid disease    hypothyroidism   Tremor, essential 05/14/2016    Past Surgical History:  Procedure Laterality Date   APPENDECTOMY     BLADDER SURGERY     x2   BLADDER SURGERY     BREAST BIOPSY     CARDIAC CATHETERIZATION     CARPAL TUNNEL RELEASE     CATARACT EXTRACTION  Bilateral    CHOLECYSTECTOMY     DILATION AND CURETTAGE OF UTERUS     ENTEROCELE REPAIR     x2   EYE SURGERY     retina   herniated disc  11/25/2021   L4 and L5   hysterectomy - unknown type     JOINT REPLACEMENT     KNEE ARTHROPLASTY     KNEE SURGERY     x6   NECK SURGERY     fusion   OOPHORECTOMY     QUADRICEPS REPAIR Right    RECTOCELE REPAIR     x2   SHOULDER ARTHROSCOPY WITH SUBACROMIAL DECOMPRESSION, ROTATOR CUFF REPAIR AND BICEP TENDON REPAIR  10/06/2012   Procedure: SHOULDER ARTHROSCOPY WITH SUBACROMIAL DECOMPRESSION, ROTATOR CUFF REPAIR AND BICEP TENDON REPAIR;  Surgeon: JNita Sells MD;  Location: [facility]  Service: Orthopedics;  Laterality: Right;  Arthroscopic  Repair  of  Subscapularis, Open Biceps Tenodesis   SHOULDER SURGERY     bilateral- bones spur   SHOULDER SURGERY Left 04/15/2021   TONSILLECTOMY     TOTAL KNEE ARTHROPLASTY     TOTAL SHOULDER ARTHROPLASTY       Current Outpatient Medications  Medication Sig Dispense Refill   albuterol (VENTOLIN HFA) 108 (90 Base) MCG/ACT inhaler Inhale 2 puffs into the lungs as needed.     amLODipine (NORVASC) 2.5 MG tablet Take 1 tablet (2.5 mg  total) by mouth every evening. (Patient taking differently: Take 5 mg by mouth every evening.) 90 tablet 0   colestipol (COLESTID) 1 g tablet Take 1 g by mouth 2 (two) times daily.     Cyanocobalamin (VITAMIN B-12 IJ) Inject 1 mL as directed every 30 (thirty) days.      cyclobenzaprine (FLEXERIL) 5 MG tablet Take 5 mg by mouth 3 (three) times daily as needed for muscle spasms.     donepezil (ARICEPT) 10 MG tablet TAKE ONE TABLET BY MOUTH EVERY NIGHT AT BEDTIME 90 tablet 3   gabapentin (NEURONTIN) 300 MG capsule TAKE 1 TO 2 CAPSULES BY MOUTH UP TO TWO TIMES DAILY AS NEEDED 180 capsule 3   ibuprofen (ADVIL) 600 MG tablet ibuprofen 600 mg tablet  TAKE 1 TABLET BY MOUTH EVERY 6 HOURS AS NEEDED     ipratropium (ATROVENT) 0.03 % nasal spray Place 2 sprays  into both nostrils every 12 (twelve) hours. 30 mL 1   leflunomide (ARAVA) 20 MG tablet TAKE 1 TABLET BY MOUTH EVERY DAY 90 tablet 0   Loratadine (CLARITIN PO) Take by mouth.     Melatonin 10 MG CAPS Take 10 capsules by mouth daily.     memantine (NAMENDA) 10 MG tablet Take 1 tablet (10 mg total) by mouth 2 (two) times daily. 180 tablet 4   metoprolol tartrate (LOPRESSOR) 100 MG tablet 1 AND 1/2 TABLET BY MOUTH 2 TWICE DAILY (Patient taking differently: Take 100 mg by mouth 2 (two) times daily.) 270 tablet 2   ondansetron (ZOFRAN) 4 MG tablet Take by mouth as needed.     pantoprazole (PROTONIX) 20 MG tablet Take 20 mg by mouth 2 (two) times daily.     polyethylene glycol powder (GLYCOLAX/MIRALAX) 17 GM/SCOOP powder Take 17 g by mouth daily as needed for mild constipation or moderate constipation.      primidone (MYSOLINE) 50 MG tablet Take 1 tablet (50 mg total) by mouth at bedtime. 90 tablet 3   valsartan (DIOVAN) 160 MG tablet Take 1 tablet (160 mg total) by mouth 2 (two) times daily. 180 tablet 3   venlafaxine XR (EFFEXOR-XR) 75 MG 24 hr capsule TAKE ONE CAPSULE BY MOUTH DAILY WITH BREAFAST 90 capsule 3   zoledronic acid (RECLAST) 5 MG/100ML SOLN injection      furosemide (LASIX) 20 MG tablet Take 1 tablet (20 mg total) by mouth daily. (Patient not taking: Reported on 10/06/2022) 30 tablet 0   hydrALAZINE (APRESOLINE) 25 MG tablet TAKE 1 Tablet BY MOUTH THREE TIMES DAILY (HOLD IF SYSTOLIC BLOOD PRESSURE IS LESS THEN 110 (Patient not taking: Reported on 11/12/2022)     methocarbamol (ROBAXIN) 500 MG tablet Take 1 tablet (500 mg total) by mouth daily as needed for muscle spasms. (Patient not taking: Reported on 11/12/2022) 15 tablet 0   No current facility-administered medications for this visit.    Allergies:   Codeine, Myrbetriq  [mirabegron], Nystatin, Percocet [oxycodone-acetaminophen], Propoxyphene, Oxycodone-acetaminophen, Celebrex [celecoxib], Darvocet [propoxyphene n-acetaminophen],  Erythromycin, Hydrocodone, Lyrica [pregabalin], Macrodantin [nitrofurantoin], Toradol [ketorolac tromethamine], Tramadol, and Verapamil    Social History:  The patient  reports that she has never smoked. She has never been exposed to tobacco smoke. She has never used smokeless tobacco. She reports that she does not drink alcohol and does not use drugs.   Family History:  The patient's family history includes Asthma in her father, mother, sister, and sister; Breast cancer in her maternal grandmother; Colon polyps in her mother; Dementia in her mother and sister;  Diabetes in her mother; Heart attack in her father and mother; Heart disease in her father and mother; Hypertension in her sister; Lupus in her sister; Pancreatic cancer in her maternal grandfather; Parkinson's disease in her sister; Rheum arthritis in her sister; Stroke in her mother and sister; Wilson's disease in her maternal grandmother.    ROS: Noted in current history, all other systems are negative.   Physical Exam: Blood pressure 134/76, pulse 65, height '5\' 6"'$  (1.676 m), weight 178 lb (80.7 kg), SpO2 93 %.       GEN:  Well nourished, well developed in no acute distress HEENT: Normal NECK: No JVD; No carotid bruits LYMPHATICS: No lymphadenopathy CARDIAC: RRR , no murmurs, rubs, gallops RESPIRATORY:  Clear to auscultation without rales, wheezing or rhonchi  ABDOMEN: Soft, non-tender, non-distended MUSCULOSKELETAL:  No edema; No deformity  SKIN: Warm and dry NEUROLOGIC:  Alert and oriented x 3    EKG:       Recent Labs: 12/15/2021: Magnesium 2.0; TSH 0.97 09/19/2022: B Natriuretic Peptide 182.6 10/06/2022: ALT 29; BUN 8; Creat 0.75; Hemoglobin 11.9; Platelets 376; Potassium 4.7; Sodium 139    Lipid Panel    Component Value Date/Time   CHOL 215 (H) 08/01/2021 0000   CHOL 128 06/07/2019 0934   TRIG 125 08/01/2021 0000   HDL 68 08/01/2021 0000   HDL 52 06/07/2019 0934   CHOLHDL 3.2 08/01/2021 0000   VLDL 22  08/28/2016 1558   LDLCALC 123 (H) 08/01/2021 0000      Wt Readings from Last 3 Encounters:  11/12/22 178 lb (80.7 kg)  10/27/22 180 lb (81.6 kg)  10/06/22 173 lb 3.2 oz (78.6 kg)      Other studies Reviewed: Additional studies/ records that were reviewed today include: . Review of the above records demonstrates:    ASSESSMENT AND PLAN:  1. SVT -        Has had an event monitor in June, 2021.  baseline bradycardia Has seen Dr. Lovena Le of EP - she is on a high dose of metoprolol ( 300 mg a day _ which is controlling her SVT fairly well.     2.  Hypertension:     still not eating right  Increaed amlodipine to 5 mg a day , ok to take aonther amlopidone if needed.  Will recheck in 6 months   4. Minimal CAD by cath 2006 [name]):      6. Dyspnea on exertion:        Current medicines are reviewed at length with the patient today.  The patient does not have concerns regarding medicines.  The following changes have been made:  no change  Disposition:   FU with an APP in 6 months     Mertie Moores, MD  11/12/2022 10:56 AM    [facility] [address], [place], [place]  [address] Phone: [phone]; Fax: [phone]

## 2022-11-12 NOTE — Patient Instructions (Addendum)
Medication Instructions:  INCREASE Amlodipine to '5mg'$  daily *If you need a refill on your cardiac medications before your next appointment, please call your pharmacy*   Lab Work: NONE If you have labs (blood work) drawn today and your tests are completely normal, you will receive your results only by: [place] (if you have MyChart) OR A paper copy in the mail If you have any lab test that is abnormal or we need to change your treatment, we will call you to review the results.   Testing/Procedures: NONE   Follow-Up: At [facility], you and your health needs are our priority.  As part of our continuing mission to provide you with exceptional heart care, we have created designated Provider Care Teams.  These Care Teams include your primary Cardiologist (physician) and Advanced Practice Providers (APPs -  Physician Assistants and Nurse Practitioners) who all work together to provide you with the care you need, when you need it.  We recommend signing up for the patient portal called "MyChart".  Sign up information is provided on this After Visit Summary.  MyChart is used to connect with patients for Virtual Visits (Telemedicine).  Patients are able to view lab/test results, encounter notes, upcoming appointments, etc.  Non-urgent messages can be sent to your provider as well.   To learn more about what you can do with MyChart, go to [email or web address].    Your next appointment:   6 month(s)  Provider:   Lynnea Maizes, or Dick     Adopting a Healthy Lifestyle.   Weight: Know what a healthy weight is for you (roughly BMI <25) and aim to maintain this. You can calculate your body mass index on your smart phone  Diet: Aim for 7+ servings of fruits and vegetables daily Limit animal fats in diet for cholesterol and heart health - choose grass fed whenever available Avoid highly processed foods (fast food burgers, tacos, fried chicken, pizza, hot  dogs, french fries)  Saturated fat comes in the form of butter, lard, coconut oil, margarine, partially hydrogenated oils, and fat in meat. These increase your risk of cardiovascular disease.  Use healthy plant oils, such as olive, canola, soy, corn, sunflower and peanut.  Whole foods such as fruits, vegetables and whole grains have fiber  Men need > 38 grams of fiber per day Women need > 25 grams of fiber per day  Load up on vegetables and fruits - one-half of your plate: Aim for color and variety, and remember that potatoes dont count. Go for whole grains - one-quarter of your plate: Whole wheat, barley, wheat berries, quinoa, oats, brown rice, and foods made with them. If you want pasta, go with whole wheat pasta. Protein power - one-quarter of your plate: Fish, chicken, beans, and nuts are all healthy, versatile protein sources. Limit red meat. You need carbohydrates for energy! The type of carbohydrate is more important than the amount. Choose carbohydrates such as vegetables, fruits, whole grains, beans, and nuts in the place of white rice, white pasta, potatoes (baked or fried), macaroni and cheese, cakes, cookies, and donuts.  If youre thirsty, drink water. Coffee and tea are good in moderation, but skip sugary drinks and limit milk and dairy products to one or two daily servings. Keep sugar intake at 6 teaspoons or 24 grams or LESS       Exercise: Aim for 150 min of moderate intensity exercise weekly for heart health, and weights twice weekly for bone health Stay  active - any steps are better than no steps! Aim for 7-9 hours of sleep daily

## 2022-11-16 ENCOUNTER — Encounter: Payer: Self-pay | Admitting: Family Medicine

## 2022-11-16 ENCOUNTER — Ambulatory Visit (INDEPENDENT_AMBULATORY_CARE_PROVIDER_SITE_OTHER): Payer: Medicare Other | Admitting: Family Medicine

## 2022-11-16 VITALS — BP 139/65 | HR 89 | Temp 99.3°F | Ht 66.0 in | Wt 174.0 lb

## 2022-11-16 DIAGNOSIS — I10 Essential (primary) hypertension: Secondary | ICD-10-CM | POA: Diagnosis not present

## 2022-11-16 DIAGNOSIS — J069 Acute upper respiratory infection, unspecified: Secondary | ICD-10-CM

## 2022-11-16 DIAGNOSIS — R059 Cough, unspecified: Secondary | ICD-10-CM

## 2022-11-16 LAB — POCT INFLUENZA A/B
Influenza A, POC: NEGATIVE
Influenza B, POC: NEGATIVE

## 2022-11-16 LAB — POC COVID19 BINAXNOW: SARS Coronavirus 2 Ag: NEGATIVE

## 2022-11-16 MED ORDER — PREDNISONE 20 MG PO TABS: 40.0000 mg | ORAL_TABLET | Freq: Every day | ORAL | 0 refills | Status: DC

## 2022-11-16 NOTE — Progress Notes (Signed)
   Established Patient Office Visit  Subjective   Patient ID: Mia [name], female    DOB: Jun 23, 1951  Age: 72 y.o. MRN: [id number]  Chief Complaint  Patient presents with   Hypertension    HPI  Started feeling bad on Friday night, so about 2-1/2 days ago.  She has had a productive cough, lots of pressure in her chest.  She had a temp of 99.5.  No diarrhea or loose stools.  Hypertension- Pt denies chest pain, SOB, dizziness, or heart palpitations.  Taking meds as directed w/o problems.  Denies medication side effects.  Pressures have been running high on and off ever since she went to the ED about 3 weeks ago.  She is on a total of 320 mg of valsartan and amlodipine 5 mg.     ROS    Objective:     BP 139/65 (BP Location: Left Arm, Patient Position: Sitting, Cuff Size: Large)   Pulse 89   Temp 99.3 F (37.4 C)   Ht '5\' 6"'$  (1.676 m)   Wt 174 lb 0.6 oz (78.9 kg)   SpO2 100%   BMI 28.09 kg/m    Physical Exam Vitals and nursing note reviewed.  Constitutional:      Appearance: She is well-developed.  HENT:     Head: Normocephalic and atraumatic.  Cardiovascular:     Rate and Rhythm: Normal rate and regular rhythm.     Heart sounds: Normal heart sounds.  Pulmonary:     Effort: Pulmonary effort is normal.     Breath sounds: Normal breath sounds.  Skin:    General: Skin is warm and dry.  Neurological:     Mental Status: She is alert and oriented to person, place, and time.  Psychiatric:        Behavior: Behavior normal.      Results for orders placed or performed in visit on 11/16/22  POC COVID-19  Result Value Ref Range   SARS Coronavirus 2 Ag Negative Negative  POCT Influenza A/B  Result Value Ref Range   Influenza A, POC Negative Negative   Influenza B, POC Negative Negative      The 10-year ASCVD risk score (Arnett DK, et al., 2019) is: 28.8%    Assessment & Plan:   Problem List Items Addressed This Visit       Cardiovascular and Mediastinum    Hypertension    BP initially quite elevated but repeat was better.  She has been getting some higher blood pressures at home.  Plan to increase amlodipine to 10 mg daily follow for the next couple of weeks if she is doing well we can always send in the tens.      Other Visit Diagnoses     Cough, unspecified type    -  Primary   Relevant Orders   RSV screen (nasopharyngeal)not at [facility]   POC COVID-19 (Completed)   POCT Influenza A/B (Completed)   Viral upper respiratory tract infection          Service visit note viral upper respiratory infection-negative for COVID and flu RSV pending.  I am going to put her on prednisone for now.  Suspect this could be viral but she is immunocompromised so have a low threshold to treat her with an antibiotic if the RSV is negative.  No follow-ups on file.    [name], MD

## 2022-11-16 NOTE — Assessment & Plan Note (Signed)
BP initially quite elevated but repeat was better.  She has been getting some higher blood pressures at home.  Plan to increase amlodipine to 10 mg daily follow for the next couple of weeks if she is doing well we can always send in the tens.

## 2022-11-17 LAB — RSV SCREEN (NASOPHARYNGEAL) NOT AT ARMC
MICRO NUMBER:: 14455130
RESULT:: NOT DETECTED
SPECIMEN QUALITY:: ADEQUATE

## 2022-11-17 MED ORDER — AZITHROMYCIN 250 MG PO TABS: ORAL_TABLET | ORAL | 0 refills | Status: AC

## 2022-11-17 NOTE — Progress Notes (Signed)
HI Mia [name] you are negative for RSV.  How you are feeling today?

## 2022-11-17 NOTE — Progress Notes (Signed)
Ok to stop the prednisone since causing insomnia and making her feel worse.  I am going to go ahead and send over a Z-Pak to the pharmacy.

## 2022-11-17 NOTE — Addendum Note (Signed)
Addended by: [name] D on: 11/17/2022 05:26 PM   Modules accepted: Orders

## 2022-11-17 NOTE — Progress Notes (Signed)
It looks like she cannot take any of the prescription cough syrups because they have codeine or hydrocodone.  She can certainly use Robitussin.  If there is a cough syrup that she is used in the past I am happy to call it in.  But they are all coming up as contraindicated based on her allergy list.

## 2022-11-19 ENCOUNTER — Encounter: Payer: Self-pay | Admitting: Family Medicine

## 2022-11-19 ENCOUNTER — Inpatient Hospital Stay: Admit: 2022-11-19 | Payer: MEDICARE | Attending: Radiation Oncology | Primary: Internal Medicine

## 2022-11-19 DIAGNOSIS — C50512 Malignant neoplasm of lower-outer quadrant of left female breast: Secondary | ICD-10-CM

## 2022-11-20 MED ORDER — IPRATROPIUM BROMIDE 0.03 % NA SOLN: 2.0000 | Freq: Two times a day (BID) | NASAL | 1 refills | Status: DC

## 2022-11-20 NOTE — Telephone Encounter (Signed)
Meds ordered this encounter  Medications   ipratropium (ATROVENT) 0.03 % nasal spray    Sig: Place 2 sprays into both nostrils every 12 (twelve) hours.    Dispense:  30 mL    Refill:  1

## 2022-11-24 ENCOUNTER — Ambulatory Visit: Payer: Medicare Other

## 2022-12-01 ENCOUNTER — Ambulatory Visit (INDEPENDENT_AMBULATORY_CARE_PROVIDER_SITE_OTHER): Payer: Medicare Other | Admitting: Family Medicine

## 2022-12-01 VITALS — BP 97/50 | HR 66 | Ht 66.0 in

## 2022-12-01 DIAGNOSIS — D510 Vitamin B12 deficiency anemia due to intrinsic factor deficiency: Secondary | ICD-10-CM

## 2022-12-01 DIAGNOSIS — E538 Deficiency of other specified B group vitamins: Secondary | ICD-10-CM

## 2022-12-01 MED ORDER — CYANOCOBALAMIN 1000 MCG/ML IJ SOLN
1000.0000 ug | Freq: Once | INTRAMUSCULAR | Status: AC
  Administered 2022-12-01: 1000 ug via INTRAMUSCULAR

## 2022-12-01 NOTE — Progress Notes (Signed)
   Established Patient Office Visit  Subjective   Patient ID: Mia [name], female    DOB: [birth year]  Age: 72 y.o. MRN: [id number]  Chief Complaint  Patient presents with   B12 deficiency    Patient in office for nurse visit for B12 injection. Last given 10/27/2022.patient will be due for B12 lab work in March 2024.     HPI  B12 injection - nurse visit -  patient denies  GI problems, dizziness,  or problems with medicaiton.  ROS    Objective:     BP (!) 97/50   Pulse 66   Ht '5\' 6"'$  (1.676 m)   SpO2 98%   BMI 28.09 kg/m    Physical Exam   No results found for any visits on 12/01/22.    The 10-year ASCVD risk score (Arnett DK, et al., 2019) is: 15.2%    Assessment & Plan:  B12 injection - administered B12 injection RUOQ  IM - patient tolerated injection well without complications. Patient will return in 4 weeks for nurse visit B12 injection. ( Patient BP low  at check in -patient had only had a sip of water with medications and 1 cup of coffee this morning _-she also mentions that BP is always low in a.m. but high by the afternoon.she denies feeling  poorly  or dizzy or short of breath -per Dr. Madilyn Fireman patient BP acceptable for patient at this time.-)  Problem List Items Addressed This Visit       Other   B12 deficiency - Primary   Pernicious anemia    Return in about 4 weeks (around 12/29/2022) for nurse visit for B12 injection.    Rae Lips, LPN

## 2022-12-01 NOTE — Progress Notes (Signed)
Agree with documentation as above.   Elka Satterfield, MD  

## 2022-12-02 ENCOUNTER — Other Ambulatory Visit: Payer: Self-pay | Admitting: Adult Health

## 2022-12-02 ENCOUNTER — Ambulatory Visit: Payer: Medicare Other | Admitting: Adult Health

## 2022-12-02 ENCOUNTER — Encounter: Payer: Self-pay | Admitting: Adult Health

## 2022-12-09 DIAGNOSIS — M545 Low back pain, unspecified: Secondary | ICD-10-CM | POA: Diagnosis not present

## 2022-12-09 DIAGNOSIS — M546 Pain in thoracic spine: Secondary | ICD-10-CM | POA: Diagnosis not present

## 2022-12-12 ENCOUNTER — Other Ambulatory Visit: Payer: Self-pay | Admitting: Family Medicine

## 2022-12-15 ENCOUNTER — Other Ambulatory Visit: Payer: Self-pay | Admitting: Family Medicine

## 2022-12-16 DIAGNOSIS — M545 Low back pain, unspecified: Secondary | ICD-10-CM | POA: Diagnosis not present

## 2022-12-16 DIAGNOSIS — M546 Pain in thoracic spine: Secondary | ICD-10-CM | POA: Diagnosis not present

## 2022-12-17 ENCOUNTER — Telehealth: Payer: Self-pay | Admitting: Pharmacist

## 2022-12-17 ENCOUNTER — Telehealth: Payer: Medicare Other

## 2022-12-17 ENCOUNTER — Other Ambulatory Visit: Payer: Medicare Other | Admitting: Pharmacist

## 2022-12-17 NOTE — Progress Notes (Signed)
Pharmacy Documentation  Unsuccessful attempt x2 to contact patient for follow-up phone call for legacy CCM services with pharmacist.  Left voicemail for patient to return call if desiring reschedule or ongoing pharmacy outreach.   Larinda Buttery, PharmD Clinical Pharmacist [facility] [phone]

## 2022-12-18 ENCOUNTER — Other Ambulatory Visit: Payer: Medicare Other | Admitting: Pharmacist

## 2022-12-18 DIAGNOSIS — M546 Pain in thoracic spine: Secondary | ICD-10-CM | POA: Diagnosis not present

## 2022-12-18 DIAGNOSIS — M545 Low back pain, unspecified: Secondary | ICD-10-CM | POA: Diagnosis not present

## 2022-12-18 NOTE — Progress Notes (Signed)
12/18/2022 Name: Mia [name] MRN: [id number] DOB: 02-01-1951  Chief Complaint  Patient presents with   Hypertension    Mia [name] is a 72 y.o. year old female who presented for a telephone visit.   They were referred to the pharmacist by their PCP for assistance in managing hypertension via [place].   Subjective:  Care Team: Primary Care Provider: Hali Marry, MD ;  Medication Access/Adherence  Current Pharmacy:  [id number] [place], [place] - [address] [place] [address]Phone: [phone]: 3[place]:[id number]-[id number]: [phone]: [phone]  Hypertension:  Current medications:  amlodipine '5mg'$  daily (mid afternoon 5pm) Metoprolol '100mg'$  BID (~10am and 10pm) Valsartan '160mg'$  BID (~10am and 10pm)  (Venlafaxine not BP med but can impact- taking in mornings)    Patient has a validated, automated, upper arm home BP cuff Current blood pressure readings readings: SBP 160s in evening, but in mornings it is low  Patient denies hypotensive s/sx including dizziness, lightheadedness.  Patient reports occasional hypertensive symptoms including headache, chest pain, shortness of breath   Objective:  Lab Results  Component Value Date   HGBA1C 5.2 09/15/2022    Lab Results  Component Value Date   CREATININE 0.75 10/06/2022   BUN 8 10/06/2022   NA 139 10/06/2022   K 4.7 10/06/2022   CL 103 10/06/2022   CO2 27 10/06/2022    Lab Results  Component Value Date   CHOL 215 (H) 08/01/2021   HDL 68 08/01/2021   LDLCALC 123 (H) 08/01/2021   TRIG 125 08/01/2021   CHOLHDL 3.2 08/01/2021    Medications Reviewed Today     Reviewed by KDarius Bump RPH (Pharmacist) on 12/18/22 at [place]: <None>   Medication Order Taking? Sig Documenting  Provider Last Dose Status Informant  albuterol (VENTOLIN HFA) 108 (90 Base) MCG/ACT inhaler [id number] Inhale 2 puffs into the lungs as needed. [provider] Taking Active   amLODipine (NORVASC) 5 MG tablet [id number] Take 1 tablet (5 mg total) by mouth daily. Nahser, PWonda Cheng MD Taking Active   colestipol (COLESTID) 1 g tablet [id number] Take 1 g by mouth 2 (two) times daily.  Patient not taking: Reported on 12/18/2022   [provider] Not Taking Active   cyclobenzaprine (FLEXERIL) 5 MG tablet [id number] Take 5 mg by mouth 3 (three) times daily as needed for muscle spasms.  Patient not taking: Reported on 12/18/2022   [provider] Not Taking Active   gabapentin (NEURONTIN) 300 MG capsule [id number] TAKE 1 TO 2 CAPSULES BY MOUTH UP TO TWO TIMES DAILY AS NEEDED MHali Marry MD Taking Active Self  ibuprofen (ADVIL) 600 MG tablet [id number] ibuprofen 600 mg tablet  TAKE 1 TABLET BY MOUTH EVERY 6 HOURS AS NEEDED  Patient not taking: Reported on 12/18/2022   [provider] Not Taking Active   ipratropium (ATROVENT) 0.03 % nasal spray [id number] PLACE 2 SPRAYS INTO BOTH NOSTRILS EVERY 12 (TWELVE) HOURS. MHali Marry MD Taking Active   leflunomide (ARAVA) 20 MG tablet [id number] TAKE 1 TABLET BY MOUTH EVERY DAY DOfilia Neas PA-C Taking Active   Loratadine (CLARITIN PO) [id number] Take by mouth. [provider] Taking Active   Melatonin 10 MG  CAPS [id number] Yes Take 10 capsules by mouth daily. [provider] Taking Active Self  memantine (NAMENDA) 10 MG tablet [id number] Yes Take 1 tablet (10 mg total) by mouth 2 (two) times daily. Must be seen for further refills. Friedhoff Givens, NP Taking Active   metoprolol tartrate (LOPRESSOR) 100 MG tablet [id number] Yes 1 AND 1/2 TABLET BY MOUTH 2 TWICE DAILY  Patient taking differently: Take 100 mg by mouth 2 (two) times daily.   Evans Lance, MD Taking  Active   ondansetron [facility]) 4 MG tablet [id number] Yes Take by mouth as needed. [provider] Taking Active   pantoprazole (PROTONIX) 20 MG tablet [id number] Yes Take 20 mg by mouth 2 (two) times daily. [provider] Taking Active   polyethylene glycol powder (GLYCOLAX/MIRALAX) 17 GM/SCOOP powder [id number] No Take 17 g by mouth daily as needed for mild constipation or moderate constipation.   Patient not taking: Reported on 12/18/2022   [provider] Not Taking Active Self  primidone (MYSOLINE) 50 MG tablet [id number] Yes Take 1 tablet (50 mg total) by mouth at bedtime. Goldner Givens, NP Taking Active   valsartan (DIOVAN) 160 MG tablet [id number] Yes Take 1 tablet (160 mg total) by mouth 2 (two) times daily. Nahser, Wonda Cheng, MD Taking Active   venlafaxine XR (EFFEXOR-XR) 75 MG 24 hr capsule [id number] Yes TAKE ONE CAPSULE BY MOUTH DAILY WITH Minerva Fester, Megan, NP Taking Active   zoledronic acid (RECLAST) 5 MG/100ML SOLN injection [id number] Yes  [provider] Taking Active               Assessment/Plan:   Hypertension: - Currently controlled, but patient has very drastic lows and highs depending upon time of day.  - Reviewed appropriate blood pressure monitoring technique and reviewed goal blood pressure. Recommended to check home blood pressure and heart rate in AM and PM, 2-3x per week - Recommend to continue current medications    Follow Up Plan: 2-3 weeks  Larinda Buttery, PharmD Clinical Pharmacist [facility] At [facility] [phone]

## 2022-12-18 NOTE — Patient Instructions (Signed)
Mia [name] speaking with you today. As discussed, keep taking medications the same as you are doing, and check blood pressure in the morning, as well as evening, 2-3x per week. Write the numbers down and we will discuss at our next call.  Below are some helpful tips about checking blood pressure:  Check your blood pressure twice weekly, and any time you have concerning symptoms like headache, chest pain, dizziness, shortness of breath, or vision changes.   Our goal is less than 140/90.  To appropriately check your blood pressure, make sure you do the following:  1) Avoid caffeine, exercise, or tobacco products for 30 minutes before checking. Empty your bladder. 2) Sit with your back supported in a flat-backed chair. Rest your arm on something flat (arm of the chair, table, etc). 3) Sit still with your feet flat on the floor, resting, for at least 5 minutes.  4) Check your blood pressure. Take 1-2 readings.  5) Write down these readings and bring with you to any provider appointments.  Bring your home blood pressure machine with you to a provider's office for accuracy comparison at least once a year.   Make sure you take your blood pressure medications before you come to any office visit, even if you were asked to fast for labs.   Take care, Luana Shu, PharmD Clinical Pharmacist [facility] Primary [facility] At [facility] [phone]

## 2022-12-21 ENCOUNTER — Other Ambulatory Visit: Payer: Self-pay | Admitting: Adult Health

## 2022-12-21 NOTE — Progress Notes (Unsigned)
PATIENT: Mia [name] DOB: August 28, 1951  REASON FOR VISIT: follow up HISTORY FROM: patient  No chief complaint on file.    HISTORY OF PRESENT ILLNESS: Today 12/21/22:  Mia [name] is a 72 y.o. female with a history of tremor, fibromyalgia and memory disturbance. Returns today for follow-up.      Mia [name] is a 72 year old female with a history of tremor, fibromyalgia and memory disturbance. She returns today for follow-up.   Tremor: Currently taking Primidone 50 mg at bedtime. Seems to be working well. Termor well controlled.   Fibromyalgia: Currently on Effexor 75 mg daily. Has a lot of tenderness but feels effexor is helpful   Memory: Currently on Aricept 10 mg at bedtime and namenda 10 mg BID. Feels that her memory is worse. Has trouble with short term things. Reports that she forgets what people has told her. Able to complete all ADLs independently. She does write notes in order to remember. May forget to take clothes out of the washer.   Reports that she is having trouble with her balance. Has had several falls. Fortunately no significant injuries. Reports that her BP gets higher in the afternoon and evenings. SBP <180. Going to cardiology and they are aware. Also reports they are monitoring for A.fib.    11/13/2021 Mia [name] is a 72 year old female with a history of memory disturbance, tremor and fibromyalgia.  She returns today for follow-up.  Tremor: Reports it is stable.  Continues on primidone 50 mg at bedtime.  Husband reports that he has not noticed a tremor in the neck like he used to.  Fibromyalgia: Stable on Effexor.  Memory disturbance: Reports that she sometimes has trouble remembering dates has to use a calendar.  Did not remember a wedding that she was in 18 years ago.  She lives at home with her husband.  Able to complete all ADLs independently.  She manages her own medications and appointments.  Denies any trouble sleeping.  Reports good appetite.  Does report  that she will have back surgery January 31 for ruptured disc.  03/04/21: Mia [name] is a 72 year old female with a history of memory disturbance, tremor and fibromyalgia.  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  The patient reports today that years ago when she was at [place] this was also mentioned as a diagnosis.  She has had normal nerve conduction with EMGs in the past.  In regards to her tremor she feels that Mysoline continues to work fairly well for her.    Fibromyalgia: Continues on Effexor feels that this works well for her.  In regards to her memory she feels that it has gotten worse since having Covid in January.  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  She is able to complete all ADLs independently.  She has noticed some change in her balance.  States that she recently had a fall and injured her elbow. HISTORY:  Feels that her tremor has been well controlled with Mysoline.  Primarily just affects the hands  Reports that fibromyalgia continues to be fairly controlled with Effexor.  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  She returns today for an evaluation.  HISTORY Mia [name] is a 72 year old right-handed white female with a history of a mild memory  disturbance, she is on Aricept and Namenda.  She contracted the Covid virus and required hospitalization on 06 November 2019.  The patient has had some increase in fatigue and some increased cognitive slowing since that time.  She has had ongoing pain in the neck and low back, she is followed through an orthopedic surgeon.  She had a recent MRI of the cervical and lumbar spine done through [facility] in [place].  The results of the studies are not available to me.  The patient does report some gait instability, she has not had any  recent falls.  She does not use a cane.  She returns the office today for an evaluation.  She is on very low-dose Mysoline taking 50 mg at night.  REVIEW OF SYSTEMS: Out of a complete 14 system review of symptoms, the patient complains only of the following symptoms, and all other reviewed systems are negative.  See HPI  ALLERGIES: Allergies  Allergen Reactions   Codeine Nausea And Vomiting   Myrbetriq  [Mirabegron] Other (See Comments)    SEVERE HEADACHE   Nystatin    Percocet [Oxycodone-Acetaminophen] Nausea And Vomiting   Propoxyphene Nausea And Vomiting   Oxycodone-Acetaminophen Nausea Only   Celebrex [Celecoxib] Other (See Comments)    Other reaction(s): Other flushed   Darvocet [Propoxyphene N-Acetaminophen] Nausea And Vomiting   Erythromycin Nausea And Vomiting    Nausea and vomiting    Hydrocodone Nausea And Vomiting   Lyrica [Pregabalin] Swelling    Legs swelling    Macrodantin [Nitrofurantoin] Nausea And Vomiting   Toradol [Ketorolac Tromethamine] Nausea And Vomiting    Per patient, only PO form causes nausea and vomiting. Can take injection without issue   Tramadol Nausea And Vomiting   Verapamil Nausea And Vomiting    REACTION: intolerance    HOME MEDICATIONS: Outpatient Medications Prior to Visit  Medication Sig Dispense Refill   albuterol (VENTOLIN HFA) 108 (90 Base) MCG/ACT inhaler Inhale 2 puffs into the lungs as needed.     amLODipine (NORVASC) 5 MG tablet Take 1 tablet (5 mg total) by mouth daily. 90 tablet 3   colestipol (COLESTID) 1 g tablet Take 1 g by mouth 2 (two) times daily. (Patient not taking: Reported on 12/18/2022)     cyclobenzaprine (FLEXERIL) 5 MG tablet Take 5 mg by mouth 3 (three) times daily as needed for muscle spasms. (Patient not taking: Reported on 12/18/2022)     gabapentin (NEURONTIN) 300 MG capsule TAKE 1 TO 2 CAPSULES BY MOUTH UP TO TWO TIMES DAILY AS NEEDED 180 capsule 3   ibuprofen (ADVIL) 600 MG tablet ibuprofen 600 mg tablet   TAKE 1 TABLET BY MOUTH EVERY 6 HOURS AS NEEDED (Patient not taking: Reported on 12/18/2022)     ipratropium (ATROVENT) 0.03 % nasal spray PLACE 2 SPRAYS INTO BOTH NOSTRILS EVERY 12 (TWELVE) HOURS. 30 mL 1   leflunomide (ARAVA) 20 MG tablet TAKE 1 TABLET BY MOUTH EVERY DAY 90 tablet 0   Loratadine (CLARITIN PO) Take by mouth.     Melatonin 10 MG CAPS Take 10 capsules by mouth daily.     memantine (NAMENDA) 10 MG tablet Take 1 tablet (10 mg total) by mouth 2 (two) times daily. Must be seen for further refills. 30 tablet 0   metoprolol tartrate (LOPRESSOR) 100 MG tablet 1 AND 1/2 TABLET BY MOUTH 2 TWICE DAILY (Patient taking differently: Take 100 mg by mouth 2 (two) times daily.) 270 tablet 2   ondansetron (ZOFRAN) 4 MG tablet Take by  mouth as needed.     pantoprazole (PROTONIX) 20 MG tablet Take 20 mg by mouth 2 (two) times daily.     polyethylene glycol powder (GLYCOLAX/MIRALAX) 17 GM/SCOOP powder Take 17 g by mouth daily as needed for mild constipation or moderate constipation.  (Patient not taking: Reported on 12/18/2022)     primidone (MYSOLINE) 50 MG tablet Take 1 tablet (50 mg total) by mouth at bedtime. 90 tablet 3   valsartan (DIOVAN) 160 MG tablet Take 1 tablet (160 mg total) by mouth 2 (two) times daily. 180 tablet 3   venlafaxine XR (EFFEXOR-XR) 75 MG 24 hr capsule TAKE ONE CAPSULE BY MOUTH DAILY WITH BREAFAST 90 capsule 3   zoledronic acid (RECLAST) 5 MG/100ML SOLN injection      No facility-administered medications prior to visit.    PAST MEDICAL HISTORY: Past Medical History:  Diagnosis Date   Anal fissure    Anemia    Arthritis    Blood transfusion    C. difficile colitis    Chest pain    a. 01/2013 MV: EF 59%, no ischemia.   Chronic Dyspnea    a. 01/2013 Echo: EF 60-65%, Gr 1 DD, PASP 74mHg.// Echo 01/2020: EF 60-65, no RWMA, GR 1 DD, GLS -21.6%, normal RV SF, trivial MR    COVID-19    DDD (degenerative disc disease), cervical    DDD (degenerative disc disease), lumbar     Diabetes mellitus    Fibromyalgia    Gall stones    GERD (gastroesophageal reflux disease)    gastritis   Hemorrhoids    Hypertension    Interstitial cystitis    MCI (mild cognitive impairment) 11/25/2017   Memory difficulty 12/14/2013   Neuromuscular disorder (HCC)    sclerosis   Osteoporosis    Peptic ulcer    PONV (postoperative nausea and vomiting)    Pseudogout    Raynaud phenomenon    Rectal bleeding    Sleep apnea    a. on cpap.   SVT (supraventricular tachycardia)    Syncope 09/10/2015   Thyroid disease    hypothyroidism   Tremor, essential 05/14/2016    PAST SURGICAL HISTORY: Past Surgical History:  Procedure Laterality Date   APPENDECTOMY     BLADDER SURGERY     x2   BLADDER SURGERY     BREAST BIOPSY     CARDIAC CATHETERIZATION     CARPAL TUNNEL RELEASE     CATARACT EXTRACTION Bilateral    CHOLECYSTECTOMY     DILATION AND CURETTAGE OF UTERUS     ENTEROCELE REPAIR     x2   EYE SURGERY     retina   herniated disc  11/25/2021   L4 and L5   hysterectomy - unknown type     JOINT REPLACEMENT     KNEE ARTHROPLASTY     KNEE SURGERY     x6   NECK SURGERY     fusion   OOPHORECTOMY     QUADRICEPS REPAIR Right    RECTOCELE REPAIR     x2   SHOULDER ARTHROSCOPY WITH SUBACROMIAL DECOMPRESSION, ROTATOR CUFF REPAIR AND BICEP TENDON REPAIR  10/06/2012   Procedure: SHOULDER ARTHROSCOPY WITH SUBACROMIAL DECOMPRESSION, ROTATOR CUFF REPAIR AND BICEP TENDON REPAIR;  Surgeon: JNita Sells MD;  [place]  Service: Orthopedics;  Laterality: Right;  Arthroscopic  Repair  of  Subscapularis, Open Biceps Tenodesis   SHOULDER SURGERY     bilateral- bones spur   SHOULDER SURGERY Left  04/15/2021   TONSILLECTOMY     TOTAL KNEE ARTHROPLASTY     TOTAL SHOULDER ARTHROPLASTY      FAMILY HISTORY: Family History  Problem Relation Age of Onset   Heart attack Mother    Stroke Mother    Diabetes Mother    Heart disease Mother    Colon  polyps Mother    Asthma Mother    Dementia Mother    Heart disease Father    Heart attack Father    Asthma Father    Stroke Sister    Hypertension Sister    Rheum arthritis Sister    Dementia Sister    Asthma Sister    Parkinson's disease Sister    Lupus Sister    Asthma Sister    Breast cancer Maternal Grandmother    Wilson's disease Maternal Grandmother    Pancreatic cancer Maternal Grandfather     SOCIAL HISTORY: Social History   Socioeconomic History   Marital status: Married    Spouse name: Jenny Reichmann "[name]"   Number of children: 2   Years of education: 14   Highest education level: Associate degree: academic program  Occupational History   Occupation: Retired  Tobacco Use   Smoking status: Never    Passive exposure: Never   Smokeless tobacco: Never  Vaping Use   Vaping Use: Never used  Substance and Sexual Activity   Alcohol use: No    Alcohol/week: 0.0 standard drinks of alcohol   Drug use: No   Sexual activity: Not on file  Other Topics Concern   Not on file  Social History Narrative   Lives with her husband. She likes to hang out with her friends and play games and do bible study.   Social Determinants of Health   Financial Resource Strain: Low Risk  (05/05/2022)   Overall Financial Resource Strain (CARDIA)    Difficulty of Paying Living Expenses: Not hard at all  Food Insecurity: No Food Insecurity (05/05/2022)   Hunger Vital Sign    Worried About Running Out of Food in the Last Year: Never true    Ran Out of Food in the Last Year: Never true  Transportation Needs: No Transportation Needs (05/05/2022)   PRAPARE - Hydrologist (Medical): No    Lack of Transportation (Non-Medical): No  Physical Activity: Inactive (05/05/2022)   Exercise Vital Sign    Days of Exercise per Week: 0 days    Minutes of Exercise per Session: 0 min  Stress: No Stress Concern Present (05/05/2022)   [place]    Feeling of Stress : Not at all  Social Connections: [place] (05/05/2022)   Social Connection and Isolation Panel [NHANES]    Frequency of Communication with Friends and Family: More than three times a week    Frequency of Social Gatherings with Friends and Family: More than three times a week    Attends Religious Services: More than 4 times per year    Active Member of [employer] or Organizations: Yes    Attends Archivist Meetings: More than 4 times per year    Marital Status: Married  Human resources officer Violence: Not At Risk (05/05/2022)   Humiliation, Afraid, Rape, and Kick questionnaire    Fear of Current or Ex-Partner: No    Emotionally Abused: No    Physically Abused: No    Sexually Abused: No      PHYSICAL EXAM  There were no  vitals filed for this visit.  There is no height or weight on file to calculate BMI.      11/13/2021    2:01 PM 03/04/2021   10:26 AM 08/28/2020   10:34 AM  MMSE - Mini Mental State Exam  Orientation to time '5 2 3  '$ Orientation to Place '5 5 5  '$ Registration '3 3 3  '$ Attention/ Calculation '4 5 1  '$ Recall '2 2 3  '$ Language- name 2 objects '2 2 2  '$ Language- repeat '1 1 1  '$ Language- follow 3 step command '3 3 3  '$ Language- read & follow direction '1 1 1  '$ Write a sentence '1 1 1  '$ Copy design '1 1 1  '$ Total score '28 26 24     '$ Generalized: Well developed, in no acute distress   Neurological examination  Mentation: Alert oriented to time, place, history taking. Follows all commands speech and language fluent Cranial nerve II-XII: Pupils were equal round reactive to light. Extraocular movements were full, visual field were full on confrontational test. Facial sensation and strength were normal. Uvula tongue midline. Head turning and shoulder shrug  were normal and symmetric. Motor: The motor testing reveals 5 over 5 strength.  Patient has giveaway weakness noted in the left upper extremity.  Good symmetric motor  tone is noted throughout.  Sensory: Sensory testing is intact to soft touch on all 4 extremities. No evidence of extinction is noted.  Coordination: Cerebellar testing reveals good finger-nose-finger and heel-to-shin bilaterally.  Gait and station: Gait is unsteady. Uses a cane. Tandem gait no attempted.    DIAGNOSTIC DATA (LABS, IMAGING, TESTING) - I reviewed patient records, labs, notes, testing and imaging myself where available.  Lab Results  Component Value Date   WBC 6.6 10/06/2022   HGB 11.9 10/06/2022   HCT 37.5 10/06/2022   MCV 88.7 10/06/2022   PLT 376 10/06/2022      Component Value Date/Time   NA 139 10/06/2022 1123   NA 138 01/23/2020 1602   K 4.7 10/06/2022 1123   CL 103 10/06/2022 1123   CO2 27 10/06/2022 1123   GLUCOSE 127 (H) 10/06/2022 1123   BUN 8 10/06/2022 1123   BUN 9 01/23/2020 1602   CREATININE 0.75 10/06/2022 1123   CALCIUM 9.9 10/06/2022 1123   PROT 6.7 10/06/2022 1123   PROT 5.9 (L) 06/07/2019 0934   ALBUMIN 2.9 (L) 09/19/2022 0041   ALBUMIN 3.9 06/07/2019 0934   AST 33 10/06/2022 1123   ALT 29 10/06/2022 1123   ALKPHOS 137 (H) 09/19/2022 0041   BILITOT 0.5 10/06/2022 1123   BILITOT 0.3 06/07/2019 0934   GFRNONAA >60 09/19/2022 0041   GFRNONAA 81 03/10/2021 1008   GFRAA 94 03/10/2021 1008   Lab Results  Component Value Date   CHOL 215 (H) 08/01/2021   HDL 68 08/01/2021   LDLCALC 123 (H) 08/01/2021   TRIG 125 08/01/2021   CHOLHDL 3.2 08/01/2021   Lab Results  Component Value Date   HGBA1C 5.2 09/15/2022   Lab Results  Component Value Date   VITAMINB12 553 01/15/2022   Lab Results  Component Value Date   TSH 0.97 12/15/2021      ASSESSMENT AND PLAN 73 y.o. year old female  has a past medical history of Anal fissure, Anemia, Arthritis, Blood transfusion, C. difficile colitis, Chest pain, Chronic Dyspnea, COVID-19, DDD (degenerative disc disease), cervical, DDD (degenerative disc disease), lumbar, Diabetes mellitus,  Fibromyalgia, Gall stones, GERD (gastroesophageal reflux disease), Hemorrhoids, Hypertension, Interstitial cystitis, MCI (mild  cognitive impairment) (11/25/2017), Memory difficulty (12/14/2013), Neuromuscular disorder ([place]), Osteoporosis, Peptic ulcer, PONV (postoperative nausea and vomiting), Pseudogout, Raynaud phenomenon, Rectal bleeding, Sleep apnea, SVT (supraventricular tachycardia), Syncope (09/10/2015), Thyroid disease, and Tremor, essential (05/14/2016). here with :  1. Tremor  - Stable - Continue Mysoline 50 mg daily at bedtime  2.  Fibromyalgia   -Continue Effexor 75 mg daily  3.  Memory disturbance  -Continue Namenda 10 mg twice a day -Continue Aricept 10 mg at bedtime  4. Gait abnormality  - referral to PT   FU in 6 months or sooner if needed  Hakeem Givens, MSN, NP-C 12/21/2022, 2:40 PM [facility] [address] [place], [place] [address] [phone]

## 2022-12-22 ENCOUNTER — Ambulatory Visit (INDEPENDENT_AMBULATORY_CARE_PROVIDER_SITE_OTHER): Payer: Medicare Other | Admitting: Adult Health

## 2022-12-22 ENCOUNTER — Encounter: Payer: Self-pay | Admitting: Adult Health

## 2022-12-22 VITALS — BP 104/60 | HR 64 | Ht 66.0 in | Wt 175.8 lb

## 2022-12-22 DIAGNOSIS — R413 Other amnesia: Secondary | ICD-10-CM | POA: Diagnosis not present

## 2022-12-22 DIAGNOSIS — G250 Essential tremor: Secondary | ICD-10-CM | POA: Diagnosis not present

## 2022-12-22 DIAGNOSIS — M797 Fibromyalgia: Secondary | ICD-10-CM

## 2022-12-22 NOTE — Patient Instructions (Signed)
Your Plan:  Continue Mysoline 50 mg at bedtime Continue Effexor 75 mg daily  Continue Namenda and Aricept  Thank you for coming to see [place] at [facility]. I hope we have been able to provide you high quality care today.  You may receive a patient satisfaction survey over the next few weeks. We would appreciate your feedback and comments so that we may continue to improve ourselves and the health of our patients.

## 2022-12-23 DIAGNOSIS — M545 Low back pain, unspecified: Secondary | ICD-10-CM | POA: Diagnosis not present

## 2022-12-23 DIAGNOSIS — M546 Pain in thoracic spine: Secondary | ICD-10-CM | POA: Diagnosis not present

## 2022-12-25 DIAGNOSIS — M546 Pain in thoracic spine: Secondary | ICD-10-CM | POA: Diagnosis not present

## 2022-12-25 DIAGNOSIS — M545 Low back pain, unspecified: Secondary | ICD-10-CM | POA: Diagnosis not present

## 2022-12-29 ENCOUNTER — Ambulatory Visit (INDEPENDENT_AMBULATORY_CARE_PROVIDER_SITE_OTHER): Payer: Medicare Other | Admitting: Family Medicine

## 2022-12-29 VITALS — BP 106/58 | HR 62 | Ht 66.0 in

## 2022-12-29 DIAGNOSIS — E538 Deficiency of other specified B group vitamins: Secondary | ICD-10-CM

## 2022-12-29 DIAGNOSIS — M545 Low back pain, unspecified: Secondary | ICD-10-CM | POA: Diagnosis not present

## 2022-12-29 DIAGNOSIS — M546 Pain in thoracic spine: Secondary | ICD-10-CM | POA: Diagnosis not present

## 2022-12-29 MED ORDER — CYANOCOBALAMIN 1000 MCG/ML IJ SOLN
1000.0000 ug | Freq: Once | INTRAMUSCULAR | Status: AC
  Administered 2022-12-29: 1000 ug via INTRAMUSCULAR

## 2022-12-29 NOTE — Progress Notes (Signed)
Agree with documentation as above.   Warden Buffa, MD  

## 2022-12-29 NOTE — Addendum Note (Signed)
Addended by: Rae Lips on: 12/29/2022 11:34 AM   Modules accepted: Level of Service

## 2022-12-29 NOTE — Patient Instructions (Signed)
Patient will schedule  nurse visit  in 30 days and will  have B12 lab work drawn before that B12 injection given  Problem List Items Addressed This Visit

## 2022-12-29 NOTE — Progress Notes (Signed)
   Established Patient Office Visit  Subjective   Patient ID: Mia [name], female    DOB: Feb 20, 1951  Age: 72 y.o. MRN: [id number]  Chief Complaint  Patient presents with   B12 deficiency    Nurse visit B12 injection.     HPI  B12 deficiency- B12 injection- nurse visit -patient denies GI problems or problems with medicaiton. Patient does state she has had some dizziness that she felt was related to her BP .   ROS    Objective:     BP (!) 106/58 (BP Location: Left Arm, Patient Position: Sitting, Cuff Size: Normal)   Pulse 62   Ht '5\' 6"'$  (1.676 m)   SpO2 97%   BMI 28.37 kg/m    Physical Exam   No results found for any visits on 12/29/22.    The 10-year ASCVD risk score (Arnett DK, et al., 2019) is: 17.8%    Assessment & Plan:  B12 injection nurse visit. Administered IM LUOQ . Patient tolerated injection well without complications.  Dr. Madilyn Fireman  reccomends checking BP when feeling the dizziness to verify that it is not going too low.  Patient will schedule  nurse visit  in 30 days and will  have B12 lab work drawn before that B12 injection given  Problem List Items Addressed This Visit       Other   B12 deficiency - Primary    Return in about 30 days (around 01/28/2023) for nurse visit B12 inj - BLood work for B12 level to be drawn before the injection given. Rae Lips, LPN

## 2023-01-01 ENCOUNTER — Other Ambulatory Visit: Payer: Medicare Other | Admitting: Pharmacist

## 2023-01-01 NOTE — Progress Notes (Signed)
01/01/2023 Name: Mia [name] MRN: [id number] DOB: [birth year]  No chief complaint on file.   Mia [name] is a 72 y.o. year old female who presented for a telephone visit.   They were referred to the pharmacist by their PCP for assistance in managing hypertension via [place].   Subjective:  Care Team: Primary Care Provider: Hali Marry, MD ;  Medication Access/Adherence  Current Pharmacy:  [id number] [place], [place] - [address] [place] [address]Phone: [phone]: [phone] [id number] [address] [place]: [phone]: [phone]  Hypertension:  Current medications:  amlodipine '5mg'$  daily (mid afternoon 5pm) Metoprolol '100mg'$  BID (~10am and 10pm) Valsartan '160mg'$  BID (~10am and 10pm)  (Venlafaxine not BP med but can impact- taking in mornings)    Patient has a validated, automated, upper arm home BP cuff Current blood pressure readings readings: SBP 160-170s in evening, but in mornings it is low  Patient denies hypotensive s/sx including dizziness, lightheadedness.  Patient reports occasional hypertensive symptoms including headache, chest pain, shortness of breath   Objective:  Lab Results  Component Value Date   HGBA1C 5.2 09/15/2022    Lab Results  Component Value Date   CREATININE 0.75 10/06/2022   BUN 8 10/06/2022   NA 139 10/06/2022   K 4.7 10/06/2022   CL 103 10/06/2022   CO2 27 10/06/2022    Lab Results  Component Value Date   CHOL 215 (H) 08/01/2021   HDL 68 08/01/2021   LDLCALC 123 (H) 08/01/2021   TRIG 125 08/01/2021   CHOLHDL 3.2 08/01/2021    Medications Reviewed Today     Reviewed by MHali Marry MD (Physician) on 12/29/22 at 167 Med List Status: <None>   Medication Order Taking? Sig Documenting Provider Last Dose  Status Informant  albuterol (VENTOLIN HFA) 108 (90 Base) MCG/ACT inhaler [id number] Inhale 2 puffs into the lungs as needed. [provider] Taking Active   amLODipine (NORVASC) 5 MG tablet [id number] Take 1 tablet (5 mg total) by mouth daily. Nahser, PWonda Cheng MD Taking Active   Patient not taking:  Discontinued 12/29/22 1115   Cyanocobalamin (B-12 COMPLIANCE INJECTION IJ) [id number] Inject as directed every 30 (thirty) days. [provider] Taking Active   cyanocobalamin (VITAMIN B12) injection 1,000 mcg [id number]  MHali Marry MD  Expired 12/29/22 1035   cyclobenzaprine (FLEXERIL) 5 MG tablet [id number] Take 5 mg by mouth as needed for muscle spasms. [provider] Taking Active   gabapentin (NEURONTIN) 300 MG capsule [id number] TAKE 1 TO 2 CAPSULES BY MOUTH UP TO TWO TIMES DAILY AS NEEDED MHali Marry MD Taking Active Self  ibuprofen (ADVIL) 600 MG tablet [id number] as needed. [provider] Taking Active   ipratropium (ATROVENT) 0.03 % nasal spray [id number] PLACE 2 SPRAYS INTO BOTH NOSTRILS EVERY 12 (TWELVE) HOURS. MHali Marry MD Taking Active   leflunomide (ARAVA) 20 MG tablet [id number] TAKE 1 TABLET BY MOUTH EVERY DAY DOfilia Neas PA-C Taking Active   Loratadine (CLARITIN PO) [id number] Take by mouth. [provider] Taking Active   Melatonin 10 MG CAPS [id number] Take 10 capsules by mouth daily. [provider] Taking Active Self  memantine (NAMENDA) 10 MG tablet [id number] TAKE 1 TABLET  BY MOUTH TWO TIMES A DAY **MUST SEE DR FOR FURTHER REFILLSWard Givens, NP  Active   metoprolol tartrate (LOPRESSOR) 100 MG tablet [id number] No 1 AND 1/2 TABLET BY MOUTH 2 TWICE DAILY  Patient taking differently: Take 100 mg by mouth 2 (two) times daily.   Evans Lance, MD Taking Active   ondansetron [facility]) 4 MG tablet [id number] No Take by mouth as needed. [provider]  Taking Active   pantoprazole (PROTONIX) 20 MG tablet [id number] No Take 20 mg by mouth 2 (two) times daily. [provider] Taking Active   Patient not taking:  Discontinued 12/29/22 1115 primidone (MYSOLINE) 50 MG tablet [id number] No Take 1 tablet (50 mg total) by mouth at bedtime. Delay Givens, NP Taking Active   valsartan (DIOVAN) 160 MG tablet [id number] No Take 1 tablet (160 mg total) by mouth 2 (two) times daily. Nahser, Wonda Cheng, MD Taking Active   venlafaxine XR (EFFEXOR-XR) 75 MG 24 hr capsule [id number] No TAKE ONE CAPSULE BY MOUTH DAILY WITH Minerva Fester, Megan, NP Taking Active   zoledronic acid (RECLAST) 5 MG/100ML SOLN injection [id number] No  [provider] Taking Active               Assessment/Plan:   Hypertension: - Currently controlled, still with variation in blood pressure but counseled on symptoms and safety to avoid too extremes of highs or lows  - Recommend to continue current medications    Follow Up Plan: PRN  Larinda Buttery, PharmD Clinical Pharmacist [facility] [phone]

## 2023-01-05 ENCOUNTER — Other Ambulatory Visit: Payer: Self-pay | Admitting: *Deleted

## 2023-01-05 ENCOUNTER — Encounter: Payer: Self-pay | Admitting: Medical-Surgical

## 2023-01-05 ENCOUNTER — Ambulatory Visit (INDEPENDENT_AMBULATORY_CARE_PROVIDER_SITE_OTHER): Payer: Medicare Other | Admitting: Medical-Surgical

## 2023-01-05 VITALS — BP 134/66 | HR 57 | Resp 20 | Ht 66.0 in | Wt 177.1 lb

## 2023-01-05 DIAGNOSIS — H6122 Impacted cerumen, left ear: Secondary | ICD-10-CM | POA: Diagnosis not present

## 2023-01-05 DIAGNOSIS — Z79899 Other long term (current) drug therapy: Secondary | ICD-10-CM

## 2023-01-05 DIAGNOSIS — R519 Headache, unspecified: Secondary | ICD-10-CM

## 2023-01-05 MED ORDER — KETOROLAC TROMETHAMINE 30 MG/ML IJ SOLN
30.0000 mg | Freq: Once | INTRAMUSCULAR | Status: AC
  Administered 2023-01-05: 30 mg via INTRAMUSCULAR

## 2023-01-05 MED ORDER — METHYLPREDNISOLONE ACETATE 80 MG/ML IJ SUSP
80.0000 mg | Freq: Once | INTRAMUSCULAR | Status: AC
  Administered 2023-01-05: 80 mg via INTRAMUSCULAR

## 2023-01-05 NOTE — Progress Notes (Signed)
Established Patient Office Visit  Subjective   Patient ID: Mia [name], female   DOB: 05-May-1951 Age: 72 y.o. MRN: [id number]   Chief Complaint  Patient presents with   Headache    HPI Pleasant 72 year old female accompanied by her husband presenting today with complaints of pain along the left side of her head behind the ear as well as above her left eye.  Feels like the pain is deep and is described as sharp and stabbing.  Behind her ear, she feels like the pain is in the bone.  Notes some photophobia but no nausea, vomiting, or phonophobia.  Has been taking multiple agents including Tylenol, ibuprofen, migraine relief, and Claritin.  Has used heat and cold without relief.  Tried Sudafed but that did not seem to make a difference.  Notes that she does have rhinorrhea and feels like she may have had some chills but no fever.  Has not tested for COVID but has no other viral type symptoms.   Objective:    Vitals:   01/05/23 1331  BP: 134/66  Pulse: (!) 57  Resp: 20  Height: '5\' 6"'$  (1.676 m)  Weight: 177 lb 1.9 oz (80.3 kg)  SpO2: 97%  BMI (Calculated): 28.6    Physical Exam Vitals reviewed.  Constitutional:      General: She is not in acute distress.    Appearance: Normal appearance. She is not ill-appearing.  HENT:     Head: Normocephalic and atraumatic.  Cardiovascular:     Rate and Rhythm: Normal rate and regular rhythm.     Pulses: Normal pulses.     Heart sounds: Normal heart sounds.  Pulmonary:     Effort: Pulmonary effort is normal. No respiratory distress.     Breath sounds: Normal breath sounds. No wheezing, rhonchi or rales.  Skin:    General: Skin is warm and dry.  Neurological:     Mental Status: She is alert and oriented to person, place, and time.  Psychiatric:        Mood and Affect: Mood normal.        Behavior: Behavior normal.        Thought Content: Thought content normal.        Judgment: Judgment normal.   No results found. However, due to the  size of the patient record, not all encounters were searched. Please check Results Review for a complete set of results.     The 10-year ASCVD risk score (Arnett DK, et al., 2019) is: 27.1%   Values used to calculate the score:     Age: 75 years     Sex: Female     Is Non-Hispanic African American: No     Diabetic: Yes     Tobacco smoker: No     Systolic Blood Pressure: Q000111Q mmHg     Is BP treated: Yes     HDL Cholesterol: 68 mg/dL     Total Cholesterol: 215 mg/dL   Assessment & Plan:   1. Left-sided headache Exam benign today with no neurological changes noted.  Consider possible trigeminal neuralgia versus migraine.  Discussed possible measures to provide relief of symptoms.  Recommend she increase her gabapentin to twice daily for now as this may help some.  Depo-Medrol 80 mg and Toradol 30 mg IM given in office x 1.  Advised to monitor symptoms and if these do not respond to the treatment provided, she should return for further evaluation.  Patient and husband both verbalized  understanding and are agreeable to the plan. - methylPREDNISolone acetate (DEPO-MEDROL) injection 80 mg - ketorolac (TORADOL) 30 MG/ML injection 30 mg  2. Impacted cerumen of left ear She did have a large amount of cerumen in her left ear so we went ahead and did some irrigation on this today.  Ear exam benign after cerumen removed.  Return if symptoms worsen or fail to improve.  ___________________________________________ Clearnce Sorrel, DNP, APRN, FNP-BC Primary [facility] and [place]

## 2023-01-06 DIAGNOSIS — M47816 Spondylosis without myelopathy or radiculopathy, lumbar region: Secondary | ICD-10-CM | POA: Diagnosis not present

## 2023-01-06 LAB — CBC WITH DIFFERENTIAL/PLATELET
Absolute Monocytes: 845 cells/uL (ref 200–950)
Basophils Absolute: 78 cells/uL (ref 0–200)
Basophils Relative: 1.2 %
Eosinophils Absolute: 221 cells/uL (ref 15–500)
Eosinophils Relative: 3.4 %
HCT: 35.6 % (ref 35.0–45.0)
Hemoglobin: 11.3 g/dL — ABNORMAL LOW (ref 11.7–15.5)
Lymphs Abs: 2327 cells/uL (ref 850–3900)
MCH: 27.2 pg (ref 27.0–33.0)
MCHC: 31.7 g/dL — ABNORMAL LOW (ref 32.0–36.0)
MCV: 85.8 fL (ref 80.0–100.0)
MPV: 10.6 fL (ref 7.5–12.5)
Monocytes Relative: 13 %
Neutro Abs: 3029 cells/uL (ref 1500–7800)
Neutrophils Relative %: 46.6 %
Platelets: 374 10*3/uL (ref 140–400)
RBC: 4.15 10*6/uL (ref 3.80–5.10)
RDW: 14.7 % (ref 11.0–15.0)
Total Lymphocyte: 35.8 %
WBC: 6.5 10*3/uL (ref 3.8–10.8)

## 2023-01-06 LAB — COMPLETE METABOLIC PANEL WITH GFR
AG Ratio: 1.7 (calc) (ref 1.0–2.5)
ALT: 30 U/L — ABNORMAL HIGH (ref 6–29)
AST: 32 U/L (ref 10–35)
Albumin: 4 g/dL (ref 3.6–5.1)
Alkaline phosphatase (APISO): 109 U/L (ref 37–153)
BUN: 10 mg/dL (ref 7–25)
CO2: 25 mmol/L (ref 20–32)
Calcium: 9.4 mg/dL (ref 8.6–10.4)
Chloride: 98 mmol/L (ref 98–110)
Creat: 0.86 mg/dL (ref 0.60–1.00)
Globulin: 2.4 g/dL (calc) (ref 1.9–3.7)
Glucose, Bld: 96 mg/dL (ref 65–99)
Potassium: 5 mmol/L (ref 3.5–5.3)
Sodium: 131 mmol/L — ABNORMAL LOW (ref 135–146)
Total Bilirubin: 0.3 mg/dL (ref 0.2–1.2)
Total Protein: 6.4 g/dL (ref 6.1–8.1)
eGFR: 72 mL/min/{1.73_m2} (ref >=60)

## 2023-01-06 NOTE — Progress Notes (Signed)
Sodium is low at 131.  Liver function is mildly elevated at 30.  Patient should avoid NSAIDs.  We will continue to monitor labs.  CBC shows mild anemia at 11.3.  Please forward the lab results to her PCP .

## 2023-01-07 ENCOUNTER — Encounter: Payer: Self-pay | Admitting: *Deleted

## 2023-01-07 ENCOUNTER — Ambulatory Visit: Admit: 2023-01-07 | Discharge: 2023-01-07 | Payer: MEDICARE | Primary: Internal Medicine

## 2023-01-07 ENCOUNTER — Inpatient Hospital Stay: Admit: 2023-01-07 | Payer: MEDICARE | Primary: Internal Medicine

## 2023-01-07 DIAGNOSIS — E119 Type 2 diabetes mellitus without complications: Secondary | ICD-10-CM

## 2023-01-07 LAB — BASIC METABOLIC PANEL
Anion Gap: 5 mmol/L (ref 3.0–18)
BUN: 11 MG/DL (ref 7.0–18)
Bun/Cre Ratio: 16 (ref 12–20)
CO2: 29 mmol/L (ref 21–32)
Calcium: 8.9 MG/DL (ref 8.5–10.1)
Chloride: 107 mmol/L (ref 100–111)
Creatinine: 0.7 MG/DL (ref 0.6–1.3)
Est, Glom Filt Rate: >60 mL/min/{1.73_m2} (ref >60)
Glucose: 120 mg/dL — ABNORMAL HIGH (ref 74–99)
Potassium: 4 mmol/L (ref 3.5–5.5)
Sodium: 141 mmol/L (ref 136–145)

## 2023-01-07 LAB — HEMOGLOBIN A1C W/O EAG: Hemoglobin A1C: 5.7 % — ABNORMAL HIGH (ref 4.2–5.6)

## 2023-01-08 ENCOUNTER — Ambulatory Visit: Payer: MEDICARE | Primary: Internal Medicine

## 2023-01-12 ENCOUNTER — Ambulatory Visit (INDEPENDENT_AMBULATORY_CARE_PROVIDER_SITE_OTHER): Payer: Medicare Other | Admitting: Medical-Surgical

## 2023-01-12 ENCOUNTER — Encounter: Payer: Self-pay | Admitting: Medical-Surgical

## 2023-01-12 ENCOUNTER — Ambulatory Visit: Admit: 2023-01-12 | Discharge: 2023-01-12 | Payer: MEDICARE | Attending: Internal Medicine | Primary: Internal Medicine

## 2023-01-12 VITALS — BP 134/71 | HR 61 | Resp 20 | Ht 66.0 in | Wt 177.6 lb

## 2023-01-12 DIAGNOSIS — Z0000 Encounter for general adult medical examination without abnormal findings: Secondary | ICD-10-CM

## 2023-01-12 DIAGNOSIS — E538 Deficiency of other specified B group vitamins: Secondary | ICD-10-CM | POA: Diagnosis not present

## 2023-01-12 DIAGNOSIS — J0100 Acute maxillary sinusitis, unspecified: Secondary | ICD-10-CM

## 2023-01-12 MED ORDER — AMOXICILLIN-POT CLAVULANATE 875-125 MG PO TABS: 1.0000 | ORAL_TABLET | Freq: Two times a day (BID) | ORAL | 0 refills | Status: DC

## 2023-01-12 NOTE — Progress Notes (Signed)
Established Patient Office Visit  Subjective   Patient ID: Mia [name], female   DOB: 10/14/1951 Age: 72 y.o. MRN: [id number]   Chief Complaint  Patient presents with   Headache   Eye Pain    OVER LEFT EYE PAIN   Ear Pain    BEHIND LEFT EAR PAIN   HPI Pleasant 72 year old female accompanied by her husband presenting today with continued complaints of pain just behind the left ear.  She was seen 1 week ago with similar symptoms but also had a pain above and behind her left eye.  That seems to have improved however the pain behind the left ear is still present.  At the time of the appointment, she rates the pain as 2/10 however at night she has flares that wake her up from a dead sleep.  She does have a slight "buzzing" in the left ear but no hearing loss, popping, clicking, or ringing.  Denies significant issues with teeth grinding, jaw clicking, or jaw pain.  Does have chronic rhinorrhea but notes that her sinuses on the left side are a bit uncomfortable.   Objective:    Vitals:   01/12/23 1312 01/12/23 1345  BP: (!) 151/70 134/71  Pulse: (!) 58 61  Resp: 20 20  Height: 5\' 6"  (1.676 m)   Weight: 177 lb 9.6 oz (80.6 kg)   SpO2: 99% 99%  BMI (Calculated): 28.68    Physical Exam Vitals reviewed.  Constitutional:      General: She is not in acute distress.    Appearance: Normal appearance. She is not ill-appearing.  HENT:     Head: Normocephalic and atraumatic.  Cardiovascular:     Rate and Rhythm: Normal rate and regular rhythm.     Pulses: Normal pulses.     Heart sounds: Normal heart sounds.  Pulmonary:     Effort: Pulmonary effort is normal. No respiratory distress.     Breath sounds: Normal breath sounds. No wheezing, rhonchi or rales.  Skin:    General: Skin is warm and dry.  Neurological:     Mental Status: She is alert and oriented to person, place, and time.  Psychiatric:        Mood and Affect: Mood normal.        Behavior: Behavior normal.        Thought  Content: Thought content normal.        Judgment: Judgment normal.   No results found. However, due to the size of the patient record, not all encounters were searched. Please check Results Review for a complete set of results.     The 10-year ASCVD risk score (Arnett DK, et al., 2019) is: 27.1%   Values used to calculate the score:     Age: 45 years     Sex: Female     Is Non-Hispanic African American: No     Diabetic: Yes     Tobacco smoker: No     Systolic Blood Pressure: Q000111Q mmHg     Is BP treated: Yes     HDL Cholesterol: 68 mg/dL     Total Cholesterol: 215 mg/dL   Assessment & Plan:   1. Acute non-recurrent maxillary sinusitis Unclear etiology of pain behind the left ear.  Exam is benign for the most part although the left TM and external ear canal are a bit erythematous.  On exam, she does have left maxillary sinus tenderness with mild left frontal sinus tenderness to palpation.  Plan to  go ahead and treat for maxillary sinusitis with Augmentin twice daily x 7 days.  If no improvement on this, recommend she follow-up with her neurologist regarding the continued pain.  Patient and husband verbalized understanding and is agreeable to the plan.  Return if symptoms worsen or fail to improve.  ___________________________________________ Clearnce Sorrel, DNP, APRN, FNP-BC Primary Care and [place]

## 2023-01-12 NOTE — Patient Instructions (Signed)
Learning About Being Active as an Older Adult  Why is being active important as you get older?     Being active is one of the best things you can do for your health. And it's never too late to start. Being active--or getting active, if you aren't already--has definite benefits. It can:  Give you more energy,  Keep your mind sharp.  Improve balance to reduce your risk of falls.  Help you manage chronic illness with fewer medicines.  No matter how old you are, how fit you are, or what health problems you have, there is a form of activity that will work for you. And the more physical activity you can do, the better your overall health will be.  What kinds of activity can help you stay healthy?  Being more active will make your daily activities easier. Physical activity includes planned exercise and things you do in daily life. There are four types of activity:  Aerobic.  Doing aerobic activity makes your heart and lungs strong.  Includes walking, dancing, and gardening.  Aim for at least 2 hours spread throughout the week.  It improves your energy and can help you sleep better.  Muscle-strengthening.  This type of activity can help maintain muscle and strengthen bones.  Includes climbing stairs, using resistance bands, and lifting or carrying heavy loads.  Aim for at least twice a week.  It can help protect the knees and other joints.  Stretching.  Stretching gives you better range of motion in joints and muscles.  Includes upper arm stretches, calf stretches, and gentle yoga.  Aim for at least twice a week, preferably after your muscles are warmed up from other activities.  It can help you function better in daily life.  Balancing.  This helps you stay coordinated and have good posture.  Includes heel-to-toe walking, tai chi, and certain types of yoga.  Aim for at least 3 days a week.  It can reduce your risk of falling.  Even if you have a hard time meeting the recommendations, it's better to be more active  than less active. All activity done in each category counts toward your weekly total. You'd be surprised how daily things like carrying groceries, keeping up with grandchildren, and taking the stairs can add up.  What keeps you from being active?  If you've had a hard time being more active, you're not alone. Maybe you remember being able to do more. Or maybe you've never thought of yourself as being active. It's frustrating when you can't do the things you want. Being more active can help. What's holding you back?  Getting started.  Have a goal, but break it into easy tasks. Small steps build into big accomplishments.  Staying motivated.  If you feel like skipping your activity, remember your goal. Maybe you want to move better and stay independent. Every activity gets you one step closer.  Not feeling your best.  Start with 5 minutes of an activity you enjoy. Prove to yourself you can do it. As you get comfortable, increase your time.  You may not be where you want to be. But you're in the process of getting there. Everyone starts somewhere.  How can you find safe ways to stay active?  Talk with your doctor about any physical challenges you're facing. Make a plan with your doctor if you have a health problem or aren't sure how to get started with activity.  If you're already active, ask your doctor if  there is anything you should change to stay safe as your body and health change.  If you tend to feel dizzy after you take medicine, avoid activity at that time. Try being active before you take your medicine. This will reduce your risk of falls.  If you plan to be active at home, make sure to clear your space before you get started. Remove things like TV cords, coffee tables, and throw rugs. It's safest to have plenty of space to move freely.  The key to getting more active is to take it slow and steady. Try to improve only a little bit at a time. Pick just one area to improve on at first. And if an activity hurts,  stop and talk to your doctor.  Where can you learn more?  Go to https://www.bennett.info/ and enter P600 to learn more about "Learning About Being Active as an Older Adult."  Current as of: June 5, 2023Content Version: 14.0   2006-2024 Healthwise, Incorporated.   Care instructions adapted under license by [facility]. If you have questions about a medical condition or this instruction, always ask your healthcare professional. [place] any warranty or liability for your use of this information.           Learning About Dental Care for Older Adults  Dental care for older adults: Overview  Dental care for older people is much the same as for younger adults. But older adults do have concerns that younger adults do not. Older adults may have problems with gum disease and decay on the roots of their teeth. They may need missing teeth replaced or broken fillings fixed. Or they may have dentures that need to be cared for. Some older adults may have trouble holding a toothbrush.  You can help remind the person you are caring for to brush and floss their teeth or to clean their dentures. In some cases, you may need to do the brushing and other dental care tasks. People who have trouble using their hands or who have dementia may need this extra help.  How can you help with dental care?  Normal dental care  To keep the teeth and gums healthy:  Brush the teeth with fluoride toothpaste twice a day--in the morning and at night--and floss at least once a day. Plaque can quickly build up on the teeth of older adults.  Watch for the signs of gum disease. These signs include gums that bleed after brushing or after eating hard foods, such as apples.  See a dentist regularly. Many experts recommend checkups every 6 months.  Keep the dentist up to date on any new medications the person is taking.  Encourage a balanced diet that includes whole grains, vegetables, and fruits, and that  is low in saturated fat and sodium.  Encourage the person you're caring for not to use tobacco products. They can affect dental and general health.  Many older adults have a fixed income and feel that they can't afford dental care. But most towns and cities have programs in which dentists help older adults by lowering fees. Contact your area's public health offices or social services for information about dental care in your area.  Using a toothbrush  Older adults with arthritis sometimes have trouble brushing their teeth because they can't easily hold the toothbrush. Their hands and fingers may be stiff, painful, or weak. If this is the case, you can:  Offer an IT trainer toothbrush.  Enlarge the handle of a non-electric  toothbrush by wrapping a sponge, an elastic bandage, or adhesive tape around it.  Push the toothbrush handle through a ball made of rubber or soft foam.  Make the handle longer and thicker by taping Popsicle sticks or tongue depressors to it.  You may also be able to buy special toothbrushes, toothpaste dispensers, and floss holders.  Your doctor may recommend a soft-bristle toothbrush if the person you care for bleeds easily. Bleeding can happen because of a health problem or from certain medicines.  A toothpaste for sensitive teeth may help if the person you care for has sensitive teeth.  How do you brush and floss someone's teeth?  If the person you are caring for has a hard time cleaning their teeth on their own, you may need to brush and floss their teeth for them. It may be easiest to have the person sit and face away from you, and to sit or stand behind them. That way you can steady their head against your arm as you reach around to floss and brush their teeth. Choose a place that has good lighting and is comfortable for both of you.  Before you begin, gather your supplies. You will need gloves, floss, a toothbrush, and a container to hold water if you are not near a sink. Wash and dry your  hands well and put on gloves. Start by flossing:  Gently work a piece of floss between each of the teeth toward the gums. A plastic flossing tool may make this easier, and they are available at most drugstores.  Curve the floss around each tooth into a U-shape and gently slide it under the gum line.  Move the floss firmly up and down several times to scrape off the plaque.  After you've finished flossing, throw away the used floss and begin brushing:  Wet the brush and apply toothpaste.  Place the brush at a 45-degree angle where the teeth meet the gums. Press firmly, and move the brush in small circles over the surface of the teeth.  Be careful not to brush too hard. Vigorous brushing can make the gums pull away from the teeth and can scratch the tooth enamel.  Brush all surfaces of the teeth, on the tongue side and on the cheek side. Pay special attention to the front teeth and all surfaces of the back teeth.  Brush chewing surfaces with short back-and-forth strokes.  After you've finished, help the person rinse the remaining toothpaste from their mouth.  Where can you learn more?  Go to https://www.bennett.info/ and enter F944 to learn more about "[place] for Older Adults."  Current as of: August 6, 2023Content Version: 14.0   2006-2024 Healthwise, Incorporated.   Care instructions adapted under license by [facility]. If you have questions about a medical condition or this instruction, always ask your healthcare professional. [place] any warranty or liability for your use of this information.           Advance Directives: Care Instructions  Overview  An advance directive is a legal way to state your wishes at the end of your life. It tells your family and your doctor what to do if you can't say what you want.  There are two main types of advance directives. You can change them any time your wishes change.  Living will.  This form tells  your family and your doctor your wishes about life support and other treatment. The form is also called a declaration.  Medical power of attorney.  This form lets you name a person to make treatment decisions for you when you can't speak for yourself. This person is called a health care agent (health care proxy, health care surrogate). The form is also called a durable power of attorney for health care.  If you do not have an advance directive, decisions about your medical care may be made by a family member, or by a doctor or a judge who doesn't know you.  It may help to think of an advance directive as a gift to the people who care for you. If you have one, they won't have to make tough decisions by themselves.  For more information, including forms for your state, see the [employer] website (RebankingSpace.hu).  Follow-up care is a key part of your treatment and safety. Be sure to make and go to all appointments, and call your doctor if you are having problems. It's also a good idea to know your test results and keep a list of the medicines you take.  What should you include in an advance directive?  Many states have a unique advance directive form. (It may ask you to address specific issues.) Or you might use a universal form that's approved by many states.  If your form doesn't tell you what to address, it may be hard to know what to include in your advance directive. Use the questions below to help you get started.  Who do you want to make decisions about your medical care if you are not able to?  What life-support measures do you want if you have a serious illness that gets worse over time or can't be cured?  What are you most afraid of that might happen? (Maybe you're afraid of having pain, losing your independence, or being kept alive by machines.)  Where would you prefer to die? (Your home? A hospital? A nursing home?)  Do you want to donate your organs when you die?  Do you  want certain religious practices performed before you die?  When should you call for help?  Be sure to contact your doctor if you have any questions.  Where can you learn more?  Go to https://www.bennett.info/ and enter R264 to learn more about "Advance Directives: Care Instructions."  Current as of: November 16, 2023Content Version: 14.0   2006-2024 Healthwise, Incorporated.   Care instructions adapted under license by [facility]. If you have questions about a medical condition or this instruction, always ask your healthcare professional. [place] any warranty or liability for your use of this information.           A Healthy Heart: Care Instructions  Overview     Coronary artery disease, also called heart disease, occurs when a substance called plaque builds up in the vessels that supply oxygen-rich blood to your heart muscle. This can narrow the blood vessels and reduce blood flow. A heart attack happens when blood flow is completely blocked. A high-fat diet, smoking, and other factors increase the risk of heart disease.  Your doctor has found that you have a chance of having heart disease. A heart-healthy lifestyle can help keep your heart healthy and prevent heart disease. This lifestyle includes eating healthy, being active, staying at a weight that's healthy for you, and not smoking or using tobacco. It also includes taking medicines as directed, managing other health conditions, and trying to get a healthy amount of sleep.  Follow-up care is a key part of  your treatment and safety. Be sure to make and go to all appointments, and call your doctor if you are having problems. It's also a good idea to know your test results and keep a list of the medicines you take.  How can you care for yourself at home?  Diet   Use less salt when you cook and eat. This helps lower your blood pressure. Taste food before salting. Add only a little salt when you think you  need it. With time, your taste buds will adjust to less salt.    Eat fewer snack items, fast foods, canned soups, and other high-salt, high-fat, processed foods.    Read food labels and try to avoid saturated and trans fats. They increase your risk of heart disease by raising cholesterol levels.    Limit the amount of solid fat--butter, margarine, and shortening--you eat. Use olive, peanut, or canola oil when you cook. Bake, broil, and steam foods instead of frying them.    Eat a variety of fruit and vegetables every day. Dark green, deep orange, red, or yellow fruits and vegetables are especially good for you. Examples include spinach, carrots, peaches, and berries.    Foods high in fiber can reduce your cholesterol and provide important vitamins and minerals. High-fiber foods include whole-grain cereals and breads, oatmeal, beans, brown rice, citrus fruits, and apples.    Eat lean proteins. Heart-healthy proteins include seafood, lean meats and poultry, eggs, beans, peas, nuts, seeds, and soy products.    Limit drinks and foods with added sugar. These include candy, desserts, and soda pop.   Heart-healthy lifestyle   If your doctor recommends it, get more exercise. For many people, walking is a good choice. Or you may want to swim, bike, or do other activities. Bit by bit, increase the time you're active every day. Try for at least 30 minutes on most days of the week.    Try to quit or cut back on using tobacco and other nicotine products. This includes smoking and vaping. If you need help quitting, talk to your doctor about stop-smoking programs and medicines. These can increase your chances of quitting for good. Quitting is one of the most important things you can do to protect your heart. It is never too late to quit. Try to avoid secondhand smoke too.    Stay at a weight that's healthy for you. Talk to your doctor if you need help losing weight.    Try to get 7 to 9 hours of sleep each night.     Limit alcohol to 2 drinks a day for men and 1 drink a day for women. Too much alcohol can cause health problems.    Manage other health problems such as diabetes, high blood pressure, and high cholesterol. If you think you may have a problem with alcohol or drug use, talk to your doctor.   Medicines   Take your medicines exactly as prescribed. Call your doctor if you think you are having a problem with your medicine.    If your doctor recommends aspirin, take the amount directed each day. Make sure you take aspirin and not another kind of pain reliever, such as acetaminophen (Tylenol).   When should you call for help?   Call 911 if you have symptoms of a heart attack. These may include:   Chest pain or pressure, or a strange feeling in the chest.    Sweating.    Shortness of breath.    Pain, pressure,  or a strange feeling in the back, neck, jaw, or upper belly or in one or both shoulders or arms.    Lightheadedness or sudden weakness.    A fast or irregular heartbeat.   After you call 911, the operator may tell you to chew 1 adult-strength or 2 to 4 low-dose aspirin. Wait for an ambulance. Do not try to drive yourself.  Watch closely for changes in your health, and be sure to contact your doctor if you have any problems.  Where can you learn more?  Go to https://www.bennett.info/ and enter F075 to learn more about "A Healthy Heart: Care Instructions."  Current as of: June 24, 2023Content Version: 14.0   2006-2024 Healthwise, Incorporated.   Care instructions adapted under license by [facility]. If you have questions about a medical condition or this instruction, always ask your healthcare professional. [place] any warranty or liability for your use of this information.      Personalized Preventive Plan for [place] - 01/12/2023  Medicare offers a range of preventive health benefits. Some of the tests and screenings are paid in full while other may  be subject to a deductible, co-insurance, and/or copay.    Some of these benefits include a comprehensive review of your medical history including lifestyle, illnesses that may run in your family, and various assessments and screenings as appropriate.    After reviewing your medical record and screening and assessments performed today your provider may have ordered immunizations, labs, imaging, and/or referrals for you.  A list of these orders (if applicable) as well as your Preventive Care list are included within your After Visit Summary for your review.    Other Preventive Recommendations:    A preventive eye exam performed by an eye specialist is recommended every 1-2 years to screen for glaucoma; cataracts, macular degeneration, and other eye disorders.  A preventive dental visit is recommended every 6 months.  Try to get at least 150 minutes of exercise per week or 10,000 steps per day on a pedometer .  Order or download the FREE "Exercise & Physical Activity: Your Everyday Guide" from [employer]. Call [phone] or search [employer] online.  You need 1200-1500 mg of calcium and 1000-2000 IU of vitamin D per day. It is possible to meet your calcium requirement with diet alone, but a vitamin D supplement is usually necessary to meet this goal.  When exposed to the sun, use a sunscreen that protects against both UVA and UVB radiation with an SPF of 30 or greater. Reapply every 2 to 3 hours or after sweating, drying off with a towel, or swimming.  Always wear a seat belt when traveling in a car. Always wear a helmet when riding a bicycle or motorcycle.

## 2023-01-12 NOTE — Progress Notes (Signed)
Medicare Annual Wellness Visit    Laura Roman is here for Medicare AWV    Assessment & Plan   Encounter for immunization  -     Pneumococcal, PCV20, PREVNAR 20, (age 6w+), IM, PF  Advanced care planning/counseling discussion  Controlled type 2 diabetes mellitus without complication, without long-term current use of insulin ([place])  -     CBC with Auto Differential; Future  -     Comprehensive Metabolic Panel; Future  -     HEMOGLOBIN A1C W/O EAG; Future  -     Lipid Panel; Future  -     Microalbumin / Creatinine Urine Ratio; Future  Neuropathy due to chemotherapeutic drug ([place])  Anxiety  Dyslipidemia  History of breast cancer  Overweight with body mass index (BMI) of 25 to 25.9 in adult  Menopausal and perimenopausal disorder  -     DEXA BONE DENSITY AXIAL SKELETON; Future  Medicare annual wellness visit, subsequent    Recommendations for Preventive Services Due: see orders and patient instructions/AVS.  Recommended screening schedule for the next 5-10 years is provided to the patient in written form: see Patient Instructions/AVS.     Return for Medicare Annual Wellness Visit in 1 year.     Subjective       Patient's complete Health Risk Assessment and screening values have been reviewed and are found in Flowshe[facility]. The following problems were reviewed today and where indicated follow up appointments were made and/or referrals ordered.    Positive Risk Factor Screenings with Interventions:                Activity, Diet, and Weight:  On average, how many days per week do you engage in moderate to strenuous exercise (like a brisk walk)?: 0 days  On average, how many minutes do you engage in exercise at this level?: 0 min    Do you eat balanced/healthy meals regularly?: Yes    Body mass index is 27.29 kg/m.      Inactivity Interventions:  Patient declined any further interventions or treatment        Dentist Screen:  Have you seen the dentist within the past year?: (!) No    Intervention:  Patient declines any  further evaluation or treatment                      Objective   Vitals:    01/12/23 1002   BP: 127/68   Site: Left Upper Arm   Position: Sitting   Cuff Size: Medium Adult   Pulse: 82   Resp: 14   Temp: 98 F (36.7 C)   TempSrc: Temporal   SpO2: 96%   Weight: 72.1 kg (159 lb)   Height: 1.626 m (5\' 4" )      Body mass index is 27.29 kg/m.               Allergies   Allergen Reactions    Fish Oil Nausea Only     Patient denies  Other reaction(s): gi distress  Patient denies    Lidocaine Other (See Comments)     anxious    Metformin Diarrhea    Nabumetone Nausea Only    Thimerosal (Thiomersal) Hives     Prior to Visit Medications    Medication Sig Taking? Authorizing Provider   Multiple Vitamins-Minerals (MULTIVITAMIN ADULTS 50+ PO) Take 1 tablet by mouth daily Yes [provider]   lansoprazole (PREVACID) 30 MG delayed release capsule take  1 capsule by mouth BEFORE BREAKFAST DAILY Yes Deloris Mittag S, MD   atorvastatin (LIPITOR) 40 MG tablet take 1 tablet by mouth once daily Yes Sherrye Puga S, MD   gabapentin (NEURONTIN) 100 MG capsule Take 3 capsules by mouth in the morning and at bedtime for 180 days. Intended supply: 30 days Max Daily Amount: 600 mg  Patient taking differently: Take 3 capsules by mouth nightly. Intended supply: 30 days Yes Annali Lybrand S, MD   JANUVIA 100 MG tablet take 1 tablet by mouth once daily Yes Rannie Craney S, MD   citalopram (CELEXA) 10 MG tablet take 1 tablet by mouth once daily Yes Nolie Bignell S, MD   Cholecalciferol (VITAMIN D3) 1.25 MG (50000 UT) CAPS Take 1 capsule by mouth once a week Yes [provider]   Lancets MISC Use daily as directed Yes Automatic Reconciliation, Ar   aspirin 81 MG chewable tablet Take 1 tablet by mouth daily Yes Automatic Reconciliation, Ar       CareTeam (Including outside providers/suppliers regularly involved in providing care):   Patient Care Team:  Albin Felling, MD as PCP - General  Albin Felling, MD as  PCP - Empaneled Provider     Reviewed and updated this visit:  Tobacco  Allergies  Meds  Problems  Med Hx  Surg Hx  Soc Hx  Fam Hx

## 2023-01-12 NOTE — Progress Notes (Signed)
Laura Roman presents today for   Chief Complaint   Patient presents with    Medicare AWV       "Have you been to the ER, urgent care clinic since your last visit?  Hospitalized since your last visit?"    NO    "Have you seen or consulted any other health care providers outside of [place] since your last visit?"    NO

## 2023-01-12 NOTE — ACP (Advance Care Planning) (Signed)
Advance Care Planning     Advance Care Planning (ACP) Physician/NP/PA Conversation    Date of Conversation: 01/12/2023  Conducted with: Patient with Decision Making Capacity    Healthcare Decision Maker:      Primary Decision Maker: Laura Roman - Spouse - [id number]    Click here to complete Healthcare Decision Makers including selection of the Healthcare Decision Maker Relationship (ie "Primary")  Today we documented Decision Maker(s) consistent with Legal Next of Kin hierarchy.    Care Preferences:    Hospitalization:  "If your health worsens and it becomes clear that your chance of recovery is unlikely, what would be your preference regarding hospitalization?"  The patient would prefer hospitalization.    Ventilation:  "If you were unable to breath on your own and your chance of recovery was unlikely, what would be your preference about the use of a ventilator (breathing machine) if it was available to you?"  The patient would desire the use of a ventilator.    Resuscitation:  "In the event your heart stopped as a result of an underlying serious health condition, would you want attempts made to restart your heart, or would you prefer a natural death?"  Yes, attempt to resuscitate.    ventilation preferences, hospitalization preferences, and resuscitation preferences    Conversation Outcomes / Follow-Up Plan:  ACP in process - information provided, considering goals and options  Reviewed DNR/DNI and patient elects Full Code (Attempt Resuscitation)    Length of Voluntary ACP Conversation in minutes:  16 minutes    [name], MD

## 2023-01-12 NOTE — Progress Notes (Signed)
72 y.o. female who presents for evaluation.    She continues to see Dr Mariea Clonts and Dr Sula Rumple for the LEFT breast ca.  The neuropathy is controlled by taking the gaba at night, rarey takes it in the daytime.  She has mammo upcoming next month    Denied any cardiovascular complaints. Her husband had heart bypass back in Nov and then her mom had hip fx a few weeks later.  Her day is taken up helping both as mom is now at [facility] on hospice program apparently    No gi or gu issues and the constipation issues are controlled on fiber and hydration.  Seeing Dr Sheral Flow yearly for the endomet ca    Denies polyuria, polydipsia, nocturia, vision change.     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 Marisa Hua    DM (diabetes mellitus) ([place]) 10/2017    on basis of fbs>125; intol metformin    Dyslipidemia     Endometrial cancer ([place]) 02/2017    Dr Sheral Flow; stage 1B gr 1 endometroid adenoca w foci squamous diff of the endometrium; RA TLH BSO PLND, 0/13LN, MSI intact    Frozen shoulder     right Dr. 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 Synetta Shadow, Dr. Marisa Hua    Osteopenia     DEXA -0.5 spine, -0.8 hip (1/18); 0.5 spine, -1.3 hip (3/22)    Overweight (BMI 25.0-29.9)     IF 7/18 start weight 168  lbs not doing     Painless hematuria 04/2016    Dr Jimmye Norman, neg eval    Palpitations 2008    neg thallium 2008, nl holter 2008, echo nl lv/ef 65%/tr mr/dd/nl pasp    Syncope     neurocardiogenic by tilt 1994    TMJ syndrome 2023    LEFT after [place] tooth surgery    Venous insufficiency     Vitamin D deficiency      Past Surgical History:   Procedure Laterality Date    BREAST LUMPECTOMY  07/2020    and LN dissection Dr Mariea Clonts    COLONOSCOPY      Dr Rosendo Gros (2007) neg; (04/22/18) neg    HEMORRHOID SURGERY      Dr. Rosendo Gros 2007    HYSTERECTOMY (CERVIX STATUS UNKNOWN)  03/11/2017    Dr Sheral Flow    TUBAL LIGATION      UROLOGICAL SURGERY  06/2016    Dr Jimmye Norman; bladder bx showed benign lesions    US BREAST BIOPSY W LOC DEVICE 1ST LESION LEFT Left 02/21/2020  US BREAST NEEDLE BIOPSY LEFT 02/21/2020 [facility] [place]     Social History     Socioeconomic History    Marital status: Married     Spouse name: Not on file    Number of children: 2    Years of education: Not on file    Highest education level: Not on file   Occupational History    Occupation: ret benefits specialist   Tobacco Use    Smoking status: Never     Passive exposure: Never    Smokeless tobacco: Never   Vaping Use    Vaping Use: Never used   Substance and Sexual Activity    Alcohol use: Yes     Alcohol/week: 4.0 standard drinks of alcohol    Drug use: No    Sexual activity: Not Currently     Partners: Male   Other Topics Concern    Not on file   Social History Narrative    Not on file     Social Determinants of Health     Financial Resource Strain: Low Risk  (07/16/2022)    Overall Financial Resource Strain (CARDIA)     Difficulty of Paying Living Expenses: Not hard at all   Food Insecurity: Not on file (07/16/2022)   Transportation Needs: Unknown (07/16/2022)    PRAPARE - Armed forces logistics/support/administrative officer (Medical): Not on file     Lack of Transportation (Non-Medical): No   Physical Activity: Inactive (01/12/2023)    Exercise Vital Sign     Days of Exercise  per Week: 0 days     Minutes of Exercise per Session: 0 min   Stress: Not on file   Social Connections: Not on file   Intimate Partner Violence: Not on file   Housing Stability: Unknown (07/16/2022)    Housing Stability Vital Sign     Unable to Pay for Housing in the Last Year: Not on file     Number of [place] in the Last Year: Not on file     Unstable Housing in the Last Year: No     Current Outpatient Medications   Medication Sig    Multiple Vitamins-Minerals (MULTIVITAMIN ADULTS 50+ PO) Take 1 tablet by mouth daily    lansoprazole (PREVACID) 30 MG delayed release capsule take 1 capsule by mouth BEFORE BREAKFAST DAILY    atorvastatin (LIPITOR) 40 MG tablet take 1 tablet by mouth once daily    gabapentin (NEURONTIN) 100 MG capsule Take 3 capsules by mouth in the morning and at bedtime for 180 days. Intended supply: 30 days Max Daily Amount: 600 mg (Patient taking differently: Take 3 capsules by mouth nightly. Intended supply: 30 days)    JANUVIA 100 MG tablet take 1 tablet by mouth once daily    citalopram (CELEXA) 10 MG tablet take 1 tablet by mouth once daily    Cholecalciferol (VITAMIN D3) 1.25 MG (50000 UT) CAPS Take 1 capsule by mouth once a week    Lancets MISC Use daily as directed    aspirin 81 MG chewable tablet Take 1 tablet by mouth daily     No current facility-administered medications for this visit.     Allergies   Allergen Reactions    Fish Oil Nausea Only     Patient denies  Other reaction(s): gi distress  Patient denies    Lidocaine Other (See Comments)     anxious    Metformin Diarrhea    Nabumetone Nausea Only  Thimerosal (Thiomersal) Hives     REVIEW OF SYSTEMS:  mammo 4/23, colo 6/19 Dr Rosendo Gros, DEXA 3/22    Vitals:    01/12/23 1002   BP: 127/68   Site: Left Upper Arm   Position: Sitting   Cuff Size: Medium Adult   Pulse: 82   Resp: 14   Temp: 98 F (36.7 C)   TempSrc: Temporal   SpO2: 96%   Weight: 72.1 kg (159 lb)   Height: 1.626 m (5\' 4" )   Affect is appropriate.  Mood stable  No  apparent distress  HEENT --Anicteric sclerae.  No JVD, or bruits.  Thyroid fullness on left  Lungs --Clear to auscultation and percussion, normal percussion.  Heart --Regular rate and rhythm, no murmurs, rubs, gallops, or clicks.  Abdomen -- Soft and nontender, no hepatosplenomegaly or masses.  Extremities -- Without cyanosis, clubbing, tr ankle edema notd. 2+ pulses equally and bilaterally.  Varicose veins bilat    LABS  From 5/10 showed   gluc 110, cr 0.70,               alt 23,                                   chol 154, tg 143, hdl 45, ldl-c 80,                                                            tsh 1.91  From 5/10 showed                       2 hr GTT 89  From 8/10 showed                                                                vit d 23.0, ck 57, aldo 5.4  From 8/11 showed   gluc 108,                                     hba1c 6.3,                   chol 160, tg 172, hdl 39, ldl-c 87,  wbc 5.,7 hb 12.3, plt 235, ua neg,     tsh 2.28  From 5/12 showed   gluc 113, cr 0.71, gfr 94,  alt 16, hba1c 6.2, ldl-p 1989, chol 177, tg 161, hdl 43, ldl-c 102  From 11/12 showed                                                     hba1c 5.9, ldl-p 1218, chol 133, tg 116, hdl 45, ldl-c 65  From 5/13 showed   gluc 105, cr 0.55, gfr 102, alt 8,  hba1c 6.2,  chol 148, tg 107, hdl 45, ldl-c 82,  wbc 5.0, hb 12.5, plt 172, vit d 40.3  From 11/13 showed         hba1c 6.2, ldl-p 1888, chol 176, tg 155, hdl 49, ldl-c 86,  wbc 6.2, hb 12.3, plt 182, vit d 34.4  From 5/14 showed         hba1c 6.2,      chol 152, tg 157, hdl 39, ldl-c 82  From 1/15 showed   gluc 113, cr 0.68, gfr>60, alt 11, hba1c 6.2,      chol 146, tg 130, hdl 43, ldl-c 77  From 7/15 showed         hba1c 6.3,      chol 133, tg 124, hdl 39, ldl-c 69,  wbc 6.3, hb 12.2, plt 193  From 6/16 showed   gluc 106, cr 0.74, gfr>60, alt 26, hba1c 6.3,      chol 126, tg 125, hdl 46, ldl-c 55,  wbc 5.7, hb 13.2, plt 203, vit d 7.2,   tsh 1.69  From  12/16 showed gluc 110, cr 0.68, gfr>60, alt 30, hba1c 6.1,      chol 135, tg 147, hdl 47, ldl-c 59,  wbc 6.0, hb 12.7, plt 197  From 6/17 showed   gluc 122, cr 0.71, gfr>60, alt 33, hba1c 6.2,      chol 153, tg 140, hdl 46, ldl-c 79,  wbc 6.3, hb 13.1, plt 190,           tsh 1.92, hep c-  From 1/18 showed         hba1c 6.2,     chol 154, tg 181, hdl 41, ldl-c 77  From 7/18 showed   gluc 129, cr 0.63, gfr>60, alt 26, hba1c 6.1,                 wbc 6.0, hb 12.4, plt 193, vit d 79  From 1/19 showed   gluc 132, cr 0.83, gfr>60,     hba1c 6.1  From 4/19 showed         hba1c 7.0, umar 12 ,   chol 124, tg 109, hdl 46, ldl-c 56,                    vit d 56.1  From 7/19 showed   gluc 106, cr 0.67, gfr>60, alt 25, hba1c 6.0,                 wbc 5.8, hb 13.0, plt 183  From 1/20 showed        hba1c 6.0, umar 17,    chol 130, tg 113, hdl 46, ldl-c 61  From 8/20 showed   gluc 103, cr 0.73, gfr>60, alt 27, hba1c 5.7,       chol 124, tg 150, hdl 44, ldl-c 50,  wbc 5.0, hb13.0, plt 190  Form 2/21 showed         hba1c 5.9, umar na,             vit d 88.9  From 8/21 showed   gluc 109, cr 0.68, gfr>60, alt 35, hba1c 5.5,       chol 156, tg 142, hdl 51, ldl-c 77, wbc 4.2, hb 9.9,   plt 152, vit d 34.8  From 3/22 showed   gluc 100, cr 0.68, gfr>60, alt 22, hba1c 5.7,       chol 129, tg 137, hdl 48, ldl-c 54  From 9/22 showed  gluc 110, cr 0.72, gfr>60, alt 24, hba1c 5.8,       chol 128, tg 129, hdl 44, ldl-c 58, wbc 5.6, hb 12.0, plt 184  From 3/23 showed   gluc 115, cr 0.65, gfr>60,     hba1c 5.7,               vit d 93.3    Results for orders placed or performed during the hospital encounter of 01/07/23 (from the past 2160 hour(s))   Basic Metabolic Panel   Result Value Ref Range    Sodium 141 136 - 145 mmol/L    Potassium 4.0 3.5 - 5.5 mmol/L    Chloride 107 100 - 111 mmol/L    CO2 29 21 - 32 mmol/L    Anion Gap 5 3.0 - 18 mmol/L    Glucose 120 (H) 74 - 99 mg/dL    BUN 11 7.0 - 18 MG/DL    Creatinine 0.70 0.6 - 1.3 MG/DL    Bun/Cre  Ratio 16 12 - 20      Est, Glom Filt Rate >60 >60 ml/min/1.20m2    Calcium 8.9 8.5 - 10.1 MG/DL   HEMOGLOBIN A1C W/O EAG   Result Value Ref Range    Hemoglobin A1C 5.7 (H) 4.2 - 5.6 %     We reviewed the patient's labs from the last several visits to point out trends in the numbers        Patient Active Problem List   Diagnosis    Dyslipidemia    Anxiety    Vitamin D deficiency    History of breast cancer    Neuropathy due to chemotherapeutic drug (HCC)    Overweight with body mass index (BMI) of 25 to 25.9 in adult    Arthritis, degenerative    Controlled type 2 diabetes mellitus, without long-term current use of insulin (HCC)       Assessment and plan:  1.  DM.  Continue januvia and controlled.  F/u ophth  2.  Hyperlipidemia. Continue lipitor   3.  Hypovitaminosis D. Supplementation per onco  4.  Anxiety.  Continue celexa  5.  Overweight.  Lifestyle and dietary measures.  Portion control reiterated.  6.  Endometrial ca.  Per Dr Sheral Flow  7.  Breast ca.  Per Dr Meryl Crutch and Dr Mariea Clonts  8.  Possible neuropathy from chemo.  Continue gaba  9.  Venous disease.  Elevate, declined diuretic  10.  Prevnar 20 given.  Advocated RSV  17.  DEXA ordered      RTC 9/24    Above conditions discussed at length and patient vocalized understanding.  All questions answered to patient satisfaction     Diagnosis Orders   1. Encounter for immunization  Pneumococcal, PCV20, PREVNAR 41, (age 6w+), IM, PF      2. Advanced care planning/counseling discussion        3. Controlled type 2 diabetes mellitus without complication, without long-term current use of insulin (HCC)  CBC with Auto Differential    Comprehensive Metabolic Panel    HEMOGLOBIN A1C W/O EAG    Lipid Panel    Microalbumin / Creatinine Urine Ratio      4. Neuropathy due to chemotherapeutic drug ([place])        5. Anxiety        6. Dyslipidemia        7. History of breast cancer        8. Overweight with body mass index (BMI) of  25 to 25.9 in adult        9. Menopausal and  perimenopausal disorder  DEXA BONE DENSITY AXIAL SKELETON

## 2023-01-12 NOTE — Progress Notes (Signed)
Verbal order read back per Dr. Dumaran.  Patient received Prevnar 20 vaccine in right deltoid.  Patient tolerated well and left without complaints.  Patient received VIS.

## 2023-01-13 LAB — VITAMIN B12: Vitamin B-12: 464 pg/mL (ref 200–1100)

## 2023-01-13 NOTE — Progress Notes (Signed)
[place], your B12 looks great.  Continue current regimen.

## 2023-01-15 ENCOUNTER — Ambulatory Visit: Payer: MEDICARE | Attending: Internal Medicine | Primary: Internal Medicine

## 2023-01-15 DIAGNOSIS — M545 Low back pain, unspecified: Secondary | ICD-10-CM | POA: Diagnosis not present

## 2023-01-18 ENCOUNTER — Telehealth: Payer: Self-pay | Admitting: Cardiovascular Disease

## 2023-01-18 NOTE — Telephone Encounter (Signed)
Returned call to patient who states that she dropped off a form to be signed and needs a letter for DOT clearance. [place] office staff told her they placed in Dr [employer] box. Will have completed for her to pick up next week.

## 2023-01-18 NOTE — Telephone Encounter (Signed)
Patient need Department of Transportation from signed.

## 2023-01-19 ENCOUNTER — Inpatient Hospital Stay: Payer: MEDICARE | Attending: Internal Medicine | Primary: Internal Medicine

## 2023-01-19 DIAGNOSIS — N959 Unspecified menopausal and perimenopausal disorder: Secondary | ICD-10-CM

## 2023-01-20 DIAGNOSIS — M47816 Spondylosis without myelopathy or radiculopathy, lumbar region: Secondary | ICD-10-CM | POA: Diagnosis not present

## 2023-01-25 ENCOUNTER — Encounter: Payer: Self-pay | Admitting: Cardiovascular Disease

## 2023-01-25 NOTE — Telephone Encounter (Signed)
Form pulled from box, completed and signed by MD. Letter and supporting documentation complete as well. Spoke with patient who states she will plan to come by office tomorrow to pick up. Placed in envelope and put in accordion folder at front office for pick-up.

## 2023-01-26 ENCOUNTER — Inpatient Hospital Stay: Admit: 2023-01-26 | Payer: MEDICARE | Attending: Internal Medicine | Primary: Internal Medicine

## 2023-01-26 DIAGNOSIS — N959 Unspecified menopausal and perimenopausal disorder: Secondary | ICD-10-CM

## 2023-01-27 DIAGNOSIS — M5416 Radiculopathy, lumbar region: Secondary | ICD-10-CM | POA: Diagnosis not present

## 2023-01-29 ENCOUNTER — Other Ambulatory Visit: Payer: Self-pay | Admitting: Adult Health

## 2023-01-29 ENCOUNTER — Ambulatory Visit: Payer: Medicare Other

## 2023-01-30 ENCOUNTER — Other Ambulatory Visit: Payer: Self-pay | Admitting: Physician Assistant

## 2023-01-30 ENCOUNTER — Other Ambulatory Visit: Payer: Self-pay | Admitting: Family Medicine

## 2023-01-30 DIAGNOSIS — I10 Essential (primary) hypertension: Secondary | ICD-10-CM

## 2023-02-01 ENCOUNTER — Ambulatory Visit (INDEPENDENT_AMBULATORY_CARE_PROVIDER_SITE_OTHER): Payer: Medicare Other | Admitting: Family Medicine

## 2023-02-01 VITALS — BP 140/76 | HR 52 | Temp 97.7°F | Ht 66.0 in | Wt 174.0 lb

## 2023-02-01 DIAGNOSIS — E538 Deficiency of other specified B group vitamins: Secondary | ICD-10-CM

## 2023-02-01 MED ORDER — CYANOCOBALAMIN 1000 MCG/ML IJ SOLN
1000.0000 ug | Freq: Once | INTRAMUSCULAR | Status: AC
Start: 2023-02-01 — End: 2023-02-01
  Administered 2023-02-01: 1000 ug via INTRAMUSCULAR

## 2023-02-01 NOTE — Telephone Encounter (Signed)
Last Fill: 10/29/2022  Labs: 01/05/2023 Sodium is low at 131.  Liver function is mildly elevated at 30.  CBC shows mild anemia at 11.3.   Next Visit: 03/08/2023  Last Visit: 10/06/2022  DX: Seronegative rheumatoid arthritis   Current Dose per office note 10/06/2022:  Arava 20 mg 1 tablet by mouth daily   Okay to refill Arava ?

## 2023-02-01 NOTE — Progress Notes (Signed)
Agree with documentation as above.   Janai Brannigan, MD  

## 2023-02-01 NOTE — Progress Notes (Signed)
Patient is here for a vitamin B 12 injection. Denies gastrointestinal problems or dizziness.   Patient tolerated injection well without complications on the LUOQ (patient preference). Patient advised to schedule next injection in 30 days.

## 2023-02-02 DIAGNOSIS — Z85828 Personal history of other malignant neoplasm of skin: Secondary | ICD-10-CM | POA: Diagnosis not present

## 2023-02-02 DIAGNOSIS — L821 Other seborrheic keratosis: Secondary | ICD-10-CM | POA: Diagnosis not present

## 2023-02-02 DIAGNOSIS — S90862A Insect bite (nonvenomous), left foot, initial encounter: Secondary | ICD-10-CM | POA: Diagnosis not present

## 2023-02-02 DIAGNOSIS — L579 Skin changes due to chronic exposure to nonionizing radiation, unspecified: Secondary | ICD-10-CM | POA: Diagnosis not present

## 2023-02-02 DIAGNOSIS — D235 Other benign neoplasm of skin of trunk: Secondary | ICD-10-CM | POA: Diagnosis not present

## 2023-02-02 DIAGNOSIS — L570 Actinic keratosis: Secondary | ICD-10-CM | POA: Diagnosis not present

## 2023-02-02 DIAGNOSIS — L814 Other melanin hyperpigmentation: Secondary | ICD-10-CM | POA: Diagnosis not present

## 2023-02-03 NOTE — Telephone Encounter (Signed)
Pls call    pls call    DEXA shows osteopenia but not qualifying for tx  Calcium 1000, vit d 1000-2000, weight bearing exercise and recheck in 2-3 years

## 2023-02-03 NOTE — Telephone Encounter (Signed)
Called and spoke to patient, relayed message from Dr. Dumaran; patient verbalized understanding.    No further action required.

## 2023-02-11 ENCOUNTER — Encounter: Payer: Self-pay | Admitting: Family Medicine

## 2023-02-12 NOTE — Telephone Encounter (Signed)
TB you check on this.  I think I have completed this on either Tuesday or Wednesday not sure.  If we have not faxed it yet if you can do that ASAP that would be wonderful.

## 2023-02-17 DIAGNOSIS — M47896 Other spondylosis, lumbar region: Secondary | ICD-10-CM | POA: Diagnosis not present

## 2023-02-17 DIAGNOSIS — M7062 Trochanteric bursitis, left hip: Secondary | ICD-10-CM | POA: Diagnosis not present

## 2023-02-17 DIAGNOSIS — M47816 Spondylosis without myelopathy or radiculopathy, lumbar region: Secondary | ICD-10-CM | POA: Diagnosis not present

## 2023-02-18 DIAGNOSIS — M7062 Trochanteric bursitis, left hip: Secondary | ICD-10-CM | POA: Diagnosis not present

## 2023-02-19 ENCOUNTER — Encounter: Payer: Self-pay | Admitting: Adult Health

## 2023-02-22 NOTE — Telephone Encounter (Signed)
I called the patient.  She states that last Thursday morning, she woke up with the symptoms noted below.  She states she fell twice.  She fell out of the bed and hit the back of her head on the nightstand and the other time she lost her balance while going down her back steps and fell.  She states her shoulder was sore and her husband gave her some ice for her head but otherwise she states she was "not hurt".  She reports that sometime Wednesday night she was very cold and her husband had to put multiple blankets on her.  She states that on Thursday she went and got a shot in her left hip for bursitis despite her husband encouraging her not to go, and then she came home and rested in the recliner and later started to feel better into the early evening Thursday.  She states that currently she feels back to her baseline.  She denies any symptoms.  She states that her balance was already off prior to the event and she does not feel like it is any different than prior to the event.  She states that her friend was upset with her and her husband for not calling the friend and going to the hospital because she believes the patient may have had a TIA.  I advised the patient that anytime there are stroke symptoms this is considered an emergency and she should call 911 or her husband should call 911 right away for help.  I also advised her that if she ever has urgent questions such as the one she sent to [place] through MyChart, she should give [place] a call to speak with an on-call doctor sooner for advice as our office was closed.  The patient is asking that since she feels better to please see what Aundra Millet NP advises before we recommend ER today.

## 2023-02-22 NOTE — Telephone Encounter (Signed)
I called the patient back and relayed the message from [facility] NP.  The patient verbalized understanding and said she will call her primary care.  She states she feels she is "pretty much" back to her baseline.  I did reiterate to her that if she feels she is not completely back to her baseline then our recommendation is that she go to the ER.  I also told her that if she has any new symptoms or if any symptoms come back to call 911 and she said she would. We discussed examples of stroke symptoms including but not limited to numbness, weakness, loss of vision or difficulty seeing, facial droop, weakness in arm or leg, worst headache of life, slurred speech.  She verbalized understanding and appreciation for the call.

## 2023-02-22 NOTE — Telephone Encounter (Signed)
If she is completely back to her baseline with no continued deficits then I recommend that she make an appointment with her PCP for evaluation.

## 2023-02-22 NOTE — Progress Notes (Deleted)
Office Visit Note  Patient: Mia [name]             Date of Birth: January 13, 1951           MRN: [id number]             PCP: Agapito Games, MD Referring: Agapito Games, * Visit Date: 03/08/2023 Occupation: @GUAROCC @  Subjective:    History of Present Illness: Serena Nugen Quintin is a 72 y.o. female with history of seronegative rheumatoid arthritis.  Patient is taking Arava 20 mg 1 tablet by mouth daily.   CBC and CMP updated on 02/24/23.  Her next lab work will be due in August and every 3 months.  Discussed the importance of holding arava if she develops signs or symptoms of an infection and to resume once the infection has completely cleared.   Activities of Daily Living:  Patient reports morning stiffness for *** {minute/hour:19697}.   Patient {ACTIONS;DENIES/REPORTS:21021675::"Denies"} nocturnal pain.  Difficulty dressing/grooming: {ACTIONS;DENIES/REPORTS:21021675::"Denies"} Difficulty climbing stairs: {ACTIONS;DENIES/REPORTS:21021675::"Denies"} Difficulty getting out of chair: {ACTIONS;DENIES/REPORTS:21021675::"Denies"} Difficulty using hands for taps, buttons, cutlery, and/or writing: {ACTIONS;DENIES/REPORTS:21021675::"Denies"}  No Rheumatology ROS completed.   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   Weakness of both lower extremities 06/11/2020   Hyponatremia 05/22/2020   Gait instability 06/20/2019   Facet arthritis of lumbar region 05/10/2018   MCI (mild cognitive impairment) 11/25/2017   Restrictive lung disease 07/30/2017   Mixed hyperlipidemia 06/18/2017   Rectocele 04/26/2017   Irritable bowel syndrome with both constipation and diarrhea 04/26/2017   Osteoporosis 04/18/2017   Trochanteric bursitis of both hips 02/24/2017   Stress fracture of  left tibia 11/05/2016   Inflammatory arthritis 09/30/2016   High risk medication use 09/30/2016   History of Clostridium difficile colitis 09/30/2016   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   Pseudogout 09/30/2016   DDD cervical spine status post fusion 09/30/2016   DDD thoracic spine 09/30/2016   H/O total knee replacement, right 09/30/2016   Age-related osteoporosis without current pathological fracture 09/30/2016   Tremor, essential 05/14/2016   Right foot pain 04/14/2016   B12 deficiency 09/10/2015   Syncope 09/10/2015   Cervical facet joint syndrome 09/10/2015   Chronic fatigue 09/10/2015   Aortic atherosclerosis (HCC) 04/01/2015   DOE (dyspnea on exertion)    Primary osteoarthritis of right knee 08/13/2014   Benign head tremor 06/11/2014   Lumbar degenerative disc disease 06/11/2014   Hemorrhoid 03/09/2014   Retinal wrinkling, right eye 03/09/2014   Fibromyalgia 03/09/2014   GERD (gastroesophageal reflux disease) 03/09/2014   History of arthroplasty of right knee 01/31/2014   Memory difficulty 12/14/2013   Hemorrhoids, external, thrombosed 06/22/2011   Hypertension 03/11/2011   SVT (supraventricular tachycardia)    Raynaud phenomenon    Nonspecific (abnormal) findings on radiological and other examination of body structure 08/25/2010   COMPUTERIZED TOMOGRAPHY, CHEST, ABNORMAL 08/25/2010   Allergic rhinitis 06/11/2008   Dyspnea 06/11/2008   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 difficile colitis    Chest pain    a. 01/2013 MV: EF 59%,  no ischemia.   Chronic Dyspnea    a. 01/2013 Echo: EF 60-65%, Gr 1 DD, PASP 74mmHg.// Echo 01/2020: EF 60-65, no RWMA, GR 1 DD, GLS -21.6%, normal RV SF, trivial MR    COVID-19    DDD (degenerative disc  disease), cervical    DDD (degenerative disc disease), lumbar    Diabetes mellitus    Fibromyalgia    Gall stones    GERD (gastroesophageal reflux disease)    gastritis   Hemorrhoids    Hypertension    Interstitial cystitis    MCI (mild cognitive impairment) 11/25/2017   Memory difficulty 12/14/2013   Neuromuscular disorder (HCC)    sclerosis   Osteoporosis    Peptic ulcer    PONV (postoperative nausea and vomiting)    Pseudogout    Raynaud phenomenon    Rectal bleeding    Sleep apnea    a. on cpap.   SVT (supraventricular tachycardia)    Syncope 09/10/2015   Thyroid disease    hypothyroidism   Tremor, essential 05/14/2016    Family History  Problem Relation Age of Onset   Heart attack Mother    Stroke Mother    Diabetes Mother    Heart disease Mother    Colon polyps Mother    Asthma Mother    Dementia Mother    Alzheimer's disease Mother    Heart disease Father    Heart attack Father    Asthma Father    Stroke Sister    Hypertension Sister    Rheum arthritis Sister    Dementia Sister    Asthma Sister    Parkinson's disease Sister    Lupus Sister    Asthma Sister    Breast cancer Maternal Grandmother    Wilson's disease Maternal Grandmother    Pancreatic cancer Maternal Grandfather    Past Surgical History:  Procedure Laterality Date   APPENDECTOMY     BLADDER SURGERY     x2   BLADDER SURGERY     BREAST BIOPSY     CARDIAC CATHETERIZATION     CARPAL TUNNEL RELEASE     CATARACT EXTRACTION Bilateral    CHOLECYSTECTOMY     DILATION AND CURETTAGE OF UTERUS     ENTEROCELE REPAIR     x2   EYE SURGERY     retina   herniated disc  11/25/2021   L4 and L5   hysterectomy - unknown type     JOINT REPLACEMENT     KNEE ARTHROPLASTY     KNEE SURGERY     x6   NECK SURGERY     fusion   OOPHORECTOMY     QUADRICEPS REPAIR Right    RECTOCELE REPAIR     x2   SHOULDER ARTHROSCOPY WITH SUBACROMIAL DECOMPRESSION, ROTATOR CUFF REPAIR AND BICEP TENDON REPAIR   10/06/2012   Procedure: SHOULDER ARTHROSCOPY WITH SUBACROMIAL DECOMPRESSION, ROTATOR CUFF REPAIR AND BICEP TENDON REPAIR;  Surgeon: Mable Paris, MD;  Location: [facility];  Service: Orthopedics;  Laterality: Right;  Arthroscopic  Repair  of  Subscapularis, Open Biceps Tenodesis   SHOULDER SURGERY     bilateral- bones spur   SHOULDER SURGERY Left 04/15/2021   TONSILLECTOMY     TOTAL KNEE ARTHROPLASTY     TOTAL SHOULDER ARTHROPLASTY     Social History   Social History Narrative   Lives with her husband. She likes to hang out with her friends and play games and do bible study.   Immunization History  Administered Date(s) Administered   Fluad Quad(high Dose 65+) 07/13/2019, 07/10/2020, 07/22/2021, 07/22/2022   Hepatitis B, ADULT 02/25/2021, 03/28/2021, 08/26/2021   Influenza Split 07/26/2012, 06/11/2014, 07/25/2015, 06/23/2018   Influenza Whole 07/30/2009, 08/19/2010   Influenza, High Dose Seasonal PF 07/06/2016, 07/05/2017   Influenza,inj,Quad PF,6+ Mos 06/11/2014, 07/25/2015, 06/23/2018   Influenza-Unspecified 07/26/2012, 07/26/2013, 09/05/2013, 06/11/2014, 07/25/2015, 06/23/2018, 06/27/2019, 07/13/2019, 07/10/2020, 07/22/2021   Moderna Sars-Covid-2 Vaccination 02/29/2020, 03/28/2020, 10/04/2020   Pneumococcal Conjugate-13 01/04/2017   Pneumococcal Polysaccharide-23 07/30/2009, 11/14/2018   Pneumococcal-Unspecified 10/27/2015   Tdap 02/13/2008, 05/12/2018   Zoster, Live 09/01/2011     Objective: Vital Signs: There were no vitals taken for this visit.   Physical Exam Vitals and nursing note reviewed.  Constitutional:      Appearance: She is well-developed.  HENT:     Head: Normocephalic and atraumatic.  Eyes:     Conjunctiva/sclera: Conjunctivae normal.  Cardiovascular:     Rate and Rhythm: Normal rate and regular rhythm.     Heart sounds: Normal heart sounds.  Pulmonary:     Effort: Pulmonary effort is normal.     Breath sounds: Normal breath  sounds.  Abdominal:     General: Bowel sounds are normal.     Palpations: Abdomen is soft.  Musculoskeletal:     Cervical back: Normal range of motion.  Lymphadenopathy:     Cervical: No cervical adenopathy.  Skin:    General: Skin is warm and dry.     Capillary Refill: Capillary refill takes less than 2 seconds.  Neurological:     Mental Status: She is alert and oriented to person, place, and time.  Psychiatric:        Behavior: Behavior normal.      Musculoskeletal Exam: ***  CDAI Exam: CDAI Score: -- Patient Global: --; Provider Global: -- Swollen: --; Tender: -- Joint Exam 03/08/2023   No joint exam has been documented for this visit   There is currently no information documented on the homunculus. Go to the Rheumatology activity and complete the homunculus joint exam.  Investigation: No additional findings.  Imaging: No results found.  Recent Labs: Lab Results  Component Value Date   WBC 6.5 01/05/2023   HGB 11.3 (L) 01/05/2023   PLT 374 01/05/2023   NA 131 (L) 01/05/2023   K 5.0 01/05/2023   CL 98 01/05/2023   CO2 25 01/05/2023   GLUCOSE 96 01/05/2023   BUN 10 01/05/2023   CREATININE 0.86 01/05/2023   BILITOT 0.3 01/05/2023   ALKPHOS 137 (H) 09/19/2022   AST 32 01/05/2023   ALT 30 (H) 01/05/2023   PROT 6.4 01/05/2023   ALBUMIN 2.9 (L) 09/19/2022   CALCIUM 9.4 01/05/2023   GFRAA 94 03/10/2021    Speciality Comments: No specialty comments available.  Procedures:  No procedures performed Allergies: Codeine, Myrbetriq  [mirabegron], Nystatin, Percocet [oxycodone-acetaminophen], Propoxyphene, Oxycodone-acetaminophen, Celebrex [celecoxib], Darvocet [propoxyphene n-acetaminophen], Erythromycin, Hydrocodone, Lyrica [pregabalin], Macrodantin [nitrofurantoin], Toradol [ketorolac tromethamine], Tramadol, and Verapamil   Assessment / Plan:     Visit Diagnoses: Seronegative rheumatoid arthritis (HCC)  High risk medication use  Raynaud's phenomenon  without gangrene  S/P arthroscopy of left shoulder  Trochanteric bursitis, left hip  H/O total knee replacement, right  Primary osteoarthritis of left knee  DDD (degenerative disc disease), cervical  DDD (degenerative disc disease), thoracic  DDD (degenerative disc disease), lumbar  Pseudogout  History of foot fracture  Other osteoporosis without current pathological fracture  Fibromyalgia  Chronic fatigue  S/P carpal tunnel release  History  of gastroesophageal reflux (GERD)  History of hypertension  History of CHF (congestive heart failure)  IC (interstitial cystitis)  History of Clostridium difficile colitis  Orders: No orders of the defined types were placed in this encounter.  No orders of the defined types were placed in this encounter.   Face-to-face time spent with patient was *** minutes. Greater than 50% of time was spent in counseling and coordination of care.  Follow-Up Instructions: No follow-ups on file.   Gearldine Bienenstock, PA-C  Note - This record has been created using Dragon software.  Chart creation errors have been sought, but may not always  have been located. Such creation errors do not reflect on  the standard of medical care.

## 2023-02-24 ENCOUNTER — Encounter: Payer: Self-pay | Admitting: Family Medicine

## 2023-02-24 ENCOUNTER — Ambulatory Visit (INDEPENDENT_AMBULATORY_CARE_PROVIDER_SITE_OTHER): Payer: Medicare Other | Admitting: Family Medicine

## 2023-02-24 VITALS — BP 120/63 | HR 63 | Ht 66.0 in | Wt 173.0 lb

## 2023-02-24 DIAGNOSIS — W19XXXA Unspecified fall, initial encounter: Secondary | ICD-10-CM | POA: Diagnosis not present

## 2023-02-24 DIAGNOSIS — E538 Deficiency of other specified B group vitamins: Secondary | ICD-10-CM | POA: Diagnosis not present

## 2023-02-24 DIAGNOSIS — E871 Hypo-osmolality and hyponatremia: Secondary | ICD-10-CM | POA: Diagnosis not present

## 2023-02-24 DIAGNOSIS — R42 Dizziness and giddiness: Secondary | ICD-10-CM | POA: Diagnosis not present

## 2023-02-24 DIAGNOSIS — N3000 Acute cystitis without hematuria: Secondary | ICD-10-CM | POA: Diagnosis not present

## 2023-02-24 DIAGNOSIS — R531 Weakness: Secondary | ICD-10-CM

## 2023-02-24 DIAGNOSIS — I10 Essential (primary) hypertension: Secondary | ICD-10-CM | POA: Diagnosis not present

## 2023-02-24 DIAGNOSIS — R410 Disorientation, unspecified: Secondary | ICD-10-CM

## 2023-02-24 LAB — CBC WITH DIFFERENTIAL/PLATELET
Absolute Monocytes: 1115 cells/uL — ABNORMAL HIGH (ref 200–950)
Eosinophils Relative: 2.1 %
HCT: 39.9 % (ref 35.0–45.0)
Lymphs Abs: 1903 cells/uL (ref 850–3900)
MCHC: 31.6 g/dL — ABNORMAL LOW (ref 32.0–36.0)
Monocytes Relative: 15.7 %
RBC: 4.63 10*6/uL (ref 3.80–5.10)
RDW: 14.7 % (ref 11.0–15.0)
Total Lymphocyte: 26.8 %
WBC: 7.1 10*3/uL (ref 3.8–10.8)

## 2023-02-24 NOTE — Progress Notes (Signed)
Established Patient Office Visit  Subjective   Patient ID: Mia [name], female    DOB: April 18, 1951  Age: 72 y.o. MRN: [id number]  Chief Complaint  Patient presents with   Dizziness    HPI Here Today with her husband and daughter.  She is here today for feeling dizzy and for slurring her speech.  She says it started on Thursday early morning on April 25.  She felt like she was having some chills in the middle the night then the next day she was out of it and confused.  A family member had mentioned that they felt like she was slurring but other family members felt like she was not.  By Friday she actually felt much better and reached out to her neurologist who recommended that she go to the emergency room.  She did not end up going.  Then on Saturday she fell a couple of times.  She does have some more chronic gait instability but had not been falling recently.  She says she still feels just a little bit off but feels mostly like she is back to herself now.  On recent labs she was noted to have a low sodium at 131 on March 12.  She was encouraged to avoid NSAIDs and she had a mild bump in her liver function.    ROS    Objective:     BP 120/63   Pulse 63   Ht 5\' 6"  (1.676 m)   Wt 173 lb (78.5 kg)   SpO2 97%   BMI 27.92 kg/m    Physical Exam Constitutional:      Appearance: She is well-developed.  HENT:     Head: Normocephalic and atraumatic.     Right Ear: Tympanic membrane, ear canal and external ear normal.     Left Ear: Tympanic membrane, ear canal and external ear normal.     Nose: Nose normal.     Mouth/Throat:     Pharynx: Oropharynx is clear.  Eyes:     Conjunctiva/sclera: Conjunctivae normal.     Pupils: Pupils are equal, round, and reactive to light.  Neck:     Thyroid: No thyromegaly.  Cardiovascular:     Rate and Rhythm: Normal rate and regular rhythm.     Heart sounds: Normal heart sounds.  Pulmonary:     Effort: Pulmonary effort is normal.     Breath  sounds: Normal breath sounds. No wheezing.  Musculoskeletal:     Cervical back: Neck supple.  Lymphadenopathy:     Cervical: No cervical adenopathy.  Skin:    General: Skin is warm and dry.  Neurological:     General: No focal deficit present.     Mental Status: She is alert and oriented to person, place, and time.     Cranial Nerves: No cranial nerve deficit.  Psychiatric:        Mood and Affect: Mood normal.        Behavior: Behavior normal.      No results found for any visits on 02/24/23.    The 10-year ASCVD risk score (Arnett DK, et al., 2019) is: 22.3%    Assessment & Plan:   Problem List Items Addressed This Visit       Cardiovascular and Mediastinum   Hypertension   Relevant Orders   Urine Microalbumin w/creat. ratio   CBC with Differential/Platelet   COMPLETE METABOLIC PANEL WITH GFR   B12   Urinalysis, Routine w reflex microscopic  TSH   MR Brain W Wo Contrast     Other   Hyponatremia - Primary   Relevant Orders   Urine Microalbumin w/creat. ratio   CBC with Differential/Platelet   COMPLETE METABOLIC PANEL WITH GFR   B12   Urinalysis, Routine w reflex microscopic   TSH   B12 deficiency   Relevant Orders   Urine Microalbumin w/creat. ratio   CBC with Differential/Platelet   COMPLETE METABOLIC PANEL WITH GFR   B12   Urinalysis, Routine w reflex microscopic   TSH   Other Visit Diagnoses     Dizziness       Relevant Orders   Urine Microalbumin w/creat. ratio   CBC with Differential/Platelet   COMPLETE METABOLIC PANEL WITH GFR   B12   Urinalysis, Routine w reflex microscopic   TSH   MR Brain W Wo Contrast   EKG 12-Lead   Weakness       Relevant Orders   MR Brain W Wo Contrast   Confusion       Relevant Orders   MR Brain W Wo Contrast   Fall, initial encounter       Relevant Orders   MR Brain W Wo Contrast      Very concerned about the episode that she had with confusion, possible slurred speech, then felt better for 24 hours  and then started to have some increased weakness and falls.  We discussed evaluating for couple of potential causes.  She most recently was found to be hyponatremic so would like to recheck that.  Also noted to have a mild elevation in liver enzymes.  I like to get brain MRI for further workup as well as well as check a urinalysis she has been noticing an increase in her interstitial cystitis symptoms including incontinence she just has not made an appointment with her urologist again.  The statics were okay today.  EKG today shows rate of 60 bpm, normal sinus rhythm with no acute ST-T wave changes.  No follow-ups on file.   I spent 40 minutes on the day of the encounter to include pre-visit record review, face-to-face time with the patient and post visit ordering of test.   Nani Gasser, MD

## 2023-02-25 LAB — MICROSCOPIC MESSAGE

## 2023-02-25 LAB — COMPLETE METABOLIC PANEL WITH GFR
AG Ratio: 1.4 (calc) (ref 1.0–2.5)
AST: 36 U/L — ABNORMAL HIGH (ref 10–35)
CO2: 27 mmol/L (ref 20–32)
Calcium: 9.9 mg/dL (ref 8.6–10.4)
Globulin: 3 g/dL (calc) (ref 1.9–3.7)

## 2023-02-25 LAB — CBC WITH DIFFERENTIAL/PLATELET
Basophils Absolute: 128 cells/uL (ref 0–200)
Basophils Relative: 1.8 %
MCV: 86.2 fL (ref 80.0–100.0)
MPV: 10.2 fL (ref 7.5–12.5)
Neutro Abs: 3806 cells/uL (ref 1500–7800)

## 2023-02-25 LAB — URINALYSIS, ROUTINE W REFLEX MICROSCOPIC
Bilirubin Urine: NEGATIVE
Hgb urine dipstick: NEGATIVE

## 2023-02-25 MED ORDER — SULFAMETHOXAZOLE-TRIMETHOPRIM 800-160 MG PO TABS
1.0000 | ORAL_TABLET | Freq: Two times a day (BID) | ORAL | 0 refills | Status: DC
Start: 2023-02-25 — End: 2023-03-29

## 2023-02-25 NOTE — Progress Notes (Signed)
Hi Teonna, no anemia.  In fact the hemoglobin looks little bit better than it did a month ago.  No elevated white count to suspect systemic infection.  Your blood sugar was actually really low when you came in that day it was 61.  I am not sure if maybe you had not eaten.  Just but make sure you are eating regularly throughout the day and not skipping any meals.  Your sodium was actually back to normal which is great.  Liver function is up a little bit from a few days ago.  Not in a dangerous range but it is definitely higher so organ need to keep an eye on that.  And to recheck again in 3 weeks.  B12 looks good.  Thyroid is normal.  Urine sample does show a little bit of white blood cells and bacteria some actually going to put you on an antibiotic to cover for a possible UTI

## 2023-02-25 NOTE — Addendum Note (Signed)
Addended by: Nani Gasser D on: 02/25/2023 07:46 AM   Modules accepted: Orders

## 2023-02-27 ENCOUNTER — Encounter: Payer: Self-pay | Admitting: Family Medicine

## 2023-02-27 LAB — URINALYSIS, ROUTINE W REFLEX MICROSCOPIC
Glucose, UA: NEGATIVE
Hyaline Cast: NONE SEEN /LPF
Ketones, ur: NEGATIVE
Nitrite: NEGATIVE
RBC / HPF: NONE SEEN /HPF (ref 0–2)
Specific Gravity, Urine: 1.023 (ref 1.001–1.035)
pH: 6 (ref 5.0–8.0)

## 2023-02-27 LAB — CBC WITH DIFFERENTIAL/PLATELET
Eosinophils Absolute: 149 cells/uL (ref 15–500)
Hemoglobin: 12.6 g/dL (ref 11.7–15.5)
MCH: 27.2 pg (ref 27.0–33.0)
Neutrophils Relative %: 53.6 %
Platelets: 466 10*3/uL — ABNORMAL HIGH (ref 140–400)

## 2023-02-27 LAB — MICROALBUMIN / CREATININE URINE RATIO
Creatinine, Urine: 191 mg/dL (ref 20–275)
Microalb Creat Ratio: 231 mg/g creat — ABNORMAL HIGH (ref <30)
Microalb, Ur: 44.1 mg/dL

## 2023-02-27 LAB — COMPLETE METABOLIC PANEL WITH GFR
ALT: 52 U/L — ABNORMAL HIGH (ref 6–29)
Albumin: 4.1 g/dL (ref 3.6–5.1)
Alkaline phosphatase (APISO): 134 U/L (ref 37–153)
BUN: 12 mg/dL (ref 7–25)
Chloride: 97 mmol/L — ABNORMAL LOW (ref 98–110)
Creat: 0.8 mg/dL (ref 0.60–1.00)
Glucose, Bld: 61 mg/dL — ABNORMAL LOW (ref 65–99)
Potassium: 4.8 mmol/L (ref 3.5–5.3)
Sodium: 135 mmol/L (ref 135–146)
Total Bilirubin: 0.4 mg/dL (ref 0.2–1.2)
Total Protein: 7.1 g/dL (ref 6.1–8.1)
eGFR: 79 mL/min/{1.73_m2} (ref >=60)

## 2023-02-27 LAB — VITAMIN B12: Vitamin B-12: 439 pg/mL (ref 200–1100)

## 2023-02-27 LAB — TSH: TSH: 1.3 mIU/L (ref 0.40–4.50)

## 2023-02-28 ENCOUNTER — Ambulatory Visit (HOSPITAL_BASED_OUTPATIENT_CLINIC_OR_DEPARTMENT_OTHER)
Admission: RE | Admit: 2023-02-28 | Discharge: 2023-02-28 | Disposition: A | Discharge status: Home / Self Care | Payer: Medicare Other | Source: Ambulatory Visit | Attending: Family Medicine | Admitting: Family Medicine

## 2023-02-28 DIAGNOSIS — I6782 Cerebral ischemia: Secondary | ICD-10-CM | POA: Insufficient documentation

## 2023-02-28 DIAGNOSIS — W19XXXA Unspecified fall, initial encounter: Secondary | ICD-10-CM | POA: Insufficient documentation

## 2023-02-28 DIAGNOSIS — I10 Essential (primary) hypertension: Secondary | ICD-10-CM | POA: Insufficient documentation

## 2023-02-28 DIAGNOSIS — R531 Weakness: Secondary | ICD-10-CM | POA: Diagnosis not present

## 2023-02-28 DIAGNOSIS — R410 Disorientation, unspecified: Secondary | ICD-10-CM | POA: Insufficient documentation

## 2023-02-28 DIAGNOSIS — R42 Dizziness and giddiness: Secondary | ICD-10-CM | POA: Insufficient documentation

## 2023-02-28 MED ORDER — GADOBUTROL 1 MMOL/ML IV SOLN
7.5000 mL | Freq: Once | INTRAVENOUS | Status: AC | PRN
  Administered 2023-02-28: 7.5 mL via INTRAVENOUS

## 2023-03-01 ENCOUNTER — Ambulatory Visit (INDEPENDENT_AMBULATORY_CARE_PROVIDER_SITE_OTHER): Payer: Medicare Other | Admitting: Family Medicine

## 2023-03-01 ENCOUNTER — Other Ambulatory Visit: Payer: Self-pay | Admitting: Family Medicine

## 2023-03-01 VITALS — BP 125/61 | HR 52 | Temp 97.7°F | Ht 66.0 in | Wt 174.0 lb

## 2023-03-01 DIAGNOSIS — E538 Deficiency of other specified B group vitamins: Secondary | ICD-10-CM | POA: Diagnosis not present

## 2023-03-01 DIAGNOSIS — R7301 Impaired fasting glucose: Secondary | ICD-10-CM

## 2023-03-01 MED ORDER — LANCETS 30G MISC: 11 refills | Status: DC

## 2023-03-01 MED ORDER — CYANOCOBALAMIN 1000 MCG/ML IJ SOLN
1000.0000 ug | Freq: Once | INTRAMUSCULAR | Status: AC
Start: 2023-03-01 — End: 2023-03-01
  Administered 2023-03-01: 1000 ug via INTRAMUSCULAR

## 2023-03-01 MED ORDER — ONETOUCH ULTRA VI STRP: ORAL_STRIP | 12 refills | Status: DC

## 2023-03-01 NOTE — Progress Notes (Signed)
Agree with documentation as above.   Aashvi Rezabek, MD  

## 2023-03-01 NOTE — Progress Notes (Signed)
Patient is here for a vitamin B 12 injection. Denies gastrointestinal problems or dizziness.   Patient tolerated injection well without complications on the RUOQ. Patient advised to schedule next injection in 30 days.

## 2023-03-02 NOTE — Progress Notes (Signed)
She needs to f/u with her NEUROLOGISt ASAP and not driving until released by Neurology

## 2023-03-02 NOTE — Progress Notes (Signed)
Hiyab, MRI shows no sign of acute stroke or infarct which is very reassuring.  This does not rule out a mini stroke but at least there is no evidence of something larger going on which again is very reassuring.  You do have some chronic small vessel changes.  But these are similar to what was seen back in 2021 so no major changes there.

## 2023-03-03 NOTE — Telephone Encounter (Signed)
Patient called back today and said she did see PCP (on 02/24/23), had labs done, MRI brain which showed no acute stroke or other acute finding. Patient reported to PCP that she has been continuing to have bad problems with her balance (but denies dizziness or confusion) and patient said she feels like she looks drunk while walking.  Patient states she has not had any episodes like she had prior, just the balance issue.  Her primary care told her that she needs to follow-up with neurology asap. I let her know I would discuss with Megan NP and call her back with the next steps. Patient was appreciative.

## 2023-03-03 NOTE — Telephone Encounter (Signed)
I was able to schedule the patient with Dr Frances Furbish tomorrow morning 5/9 at 8:45 AM arrival 8:15 AM.

## 2023-03-04 ENCOUNTER — Institutional Professional Consult (permissible substitution): Payer: Medicare Other | Admitting: Neurology

## 2023-03-08 ENCOUNTER — Ambulatory Visit: Payer: Medicare Other | Admitting: Physician Assistant

## 2023-03-08 DIAGNOSIS — M1120 Other chondrocalcinosis, unspecified site: Secondary | ICD-10-CM

## 2023-03-08 DIAGNOSIS — M0600 Rheumatoid arthritis without rheumatoid factor, unspecified site: Secondary | ICD-10-CM

## 2023-03-08 DIAGNOSIS — M5134 Other intervertebral disc degeneration, thoracic region: Secondary | ICD-10-CM

## 2023-03-08 DIAGNOSIS — M7062 Trochanteric bursitis, left hip: Secondary | ICD-10-CM

## 2023-03-08 DIAGNOSIS — Z8719 Personal history of other diseases of the digestive system: Secondary | ICD-10-CM

## 2023-03-08 DIAGNOSIS — M1712 Unilateral primary osteoarthritis, left knee: Secondary | ICD-10-CM

## 2023-03-08 DIAGNOSIS — N3010 Interstitial cystitis (chronic) without hematuria: Secondary | ICD-10-CM

## 2023-03-08 DIAGNOSIS — Z8781 Personal history of (healed) traumatic fracture: Secondary | ICD-10-CM

## 2023-03-08 DIAGNOSIS — Z8679 Personal history of other diseases of the circulatory system: Secondary | ICD-10-CM

## 2023-03-08 DIAGNOSIS — R5382 Chronic fatigue, unspecified: Secondary | ICD-10-CM

## 2023-03-08 DIAGNOSIS — M5136 Other intervertebral disc degeneration, lumbar region: Secondary | ICD-10-CM

## 2023-03-08 DIAGNOSIS — Z79899 Other long term (current) drug therapy: Secondary | ICD-10-CM

## 2023-03-08 DIAGNOSIS — M818 Other osteoporosis without current pathological fracture: Secondary | ICD-10-CM

## 2023-03-08 DIAGNOSIS — Z8619 Personal history of other infectious and parasitic diseases: Secondary | ICD-10-CM

## 2023-03-08 DIAGNOSIS — Z9889 Other specified postprocedural states: Secondary | ICD-10-CM

## 2023-03-08 DIAGNOSIS — Z96651 Presence of right artificial knee joint: Secondary | ICD-10-CM

## 2023-03-08 DIAGNOSIS — M5030 Other cervical disc degeneration, unspecified cervical region: Secondary | ICD-10-CM

## 2023-03-08 DIAGNOSIS — I7300 Raynaud's syndrome without gangrene: Secondary | ICD-10-CM

## 2023-03-08 DIAGNOSIS — M797 Fibromyalgia: Secondary | ICD-10-CM

## 2023-03-12 NOTE — Progress Notes (Signed)
Office Visit Note  Patient: Mia [name]             Date of Birth: [birth year]           MRN: [id number]             PCP: Agapito Games, MD Referring: Agapito Games, * Visit Date: 03/26/2023 Occupation: @GUAROCC @  Subjective:  Lower back pain   History of Present Illness: SHONTRELL FEDOROV is a 72 y.o. female with history of seronegative rheumatoid arthritis and DDD.  Patient remains on arava 20 mg 1 tablet by mouth daily.  She is tolerating Arava without any side effects and has not missed any doses recently.  Patient states that she has been trying to avoid the use of NSAIDs and rarely takes Tylenol.  She continues to have chronic pain in her lower back and is followed closely by Dr. Franky Macho.  According to the patient she will require a lumbar fusion in the future but is planning to postpone surgery for now.  She continues to have generalized myalgias and muscle tenderness due to fibromyalgia.  She continues to have ongoing fatigue and occasional fibro fog.  Overall she has been sleeping well at night.  She has ongoing pain in both thumbs especially in the first MCP joints bilaterally.  She denies any joint swelling at this time.   Activities of Daily Living:  Patient reports morning stiffness for 15-30 minutes.   Patient Reports nocturnal pain.  Difficulty dressing/grooming: Denies Difficulty climbing stairs: Reports Difficulty getting out of chair: Reports Difficulty using hands for taps, buttons, cutlery, and/or writing: Denies  Review of Systems  Constitutional:  Positive for fatigue.  HENT:  Positive for mouth dryness. Negative for mouth sores.   Eyes:  Negative for dryness.  Respiratory:  Negative for shortness of breath.   Cardiovascular:  Negative for chest pain and palpitations.  Gastrointestinal:  Positive for constipation and diarrhea. Negative for blood in stool.  Endocrine: Positive for increased urination.  Genitourinary:  Negative for involuntary  urination.  Musculoskeletal:  Positive for joint pain, gait problem, joint pain, myalgias, muscle weakness, morning stiffness, muscle tenderness and myalgias. Negative for joint swelling.  Skin:  Positive for color change. Negative for rash, hair loss and sensitivity to sunlight.  Allergic/Immunologic: Negative for susceptible to infections.  Neurological:  Negative for dizziness and headaches.  Hematological:  Negative for swollen glands.  Psychiatric/Behavioral:  Negative for depressed mood and sleep disturbance. The patient is not nervous/anxious.     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   Weakness of both lower extremities 06/11/2020   Hyponatremia 05/22/2020   Gait instability 06/20/2019   Facet arthritis of lumbar region 05/10/2018   MCI (mild cognitive impairment) 11/25/2017   Restrictive lung disease 07/30/2017   Mixed hyperlipidemia 06/18/2017   Rectocele 04/26/2017   Irritable bowel syndrome with both constipation and diarrhea 04/26/2017   Osteoporosis 04/18/2017   Trochanteric bursitis of both hips 02/24/2017   Stress fracture of left tibia 11/05/2016   Inflammatory arthritis 09/30/2016   High risk medication use 09/30/2016   History of Clostridium difficile colitis 09/30/2016   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   Pseudogout 09/30/2016   DDD cervical spine status post fusion 09/30/2016   DDD thoracic spine 09/30/2016  H/O total knee replacement, right 09/30/2016   Age-related osteoporosis without current pathological fracture 09/30/2016   Tremor, essential 05/14/2016   Right foot pain 04/14/2016   B12 deficiency 09/10/2015   Syncope 09/10/2015   Cervical facet joint syndrome 09/10/2015   Chronic fatigue 09/10/2015   Aortic atherosclerosis  (HCC) 04/01/2015   DOE (dyspnea on exertion)    Primary osteoarthritis of right knee 08/13/2014   Benign head tremor 06/11/2014   Lumbar degenerative disc disease 06/11/2014   Hemorrhoid 03/09/2014   Retinal wrinkling, right eye 03/09/2014   Fibromyalgia 03/09/2014   GERD (gastroesophageal reflux disease) 03/09/2014   History of arthroplasty of right knee 01/31/2014   Memory difficulty 12/14/2013   Hemorrhoids, external, thrombosed 06/22/2011   Hypertension 03/11/2011   SVT (supraventricular tachycardia)    Raynaud phenomenon    Nonspecific (abnormal) findings on radiological and other examination of body structure 08/25/2010   COMPUTERIZED TOMOGRAPHY, CHEST, ABNORMAL 08/25/2010   Allergic rhinitis 06/11/2008   Dyspnea 06/11/2008   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 difficile colitis    Chest pain    a. 01/2013 MV: EF 59%, no ischemia.   Chronic Dyspnea    a. 01/2013 Echo: EF 60-65%, Gr 1 DD, PASP 66mmHg.// Echo 01/2020: EF 60-65, no RWMA, GR 1 DD, GLS -21.6%, normal RV SF, trivial MR    COVID-19    DDD (degenerative disc disease), cervical    DDD (degenerative disc disease), lumbar    Diabetes mellitus    Fibromyalgia    Gall stones    GERD (gastroesophageal reflux disease)    gastritis   Hemorrhoids    Hypertension    Interstitial cystitis    MCI (mild cognitive impairment) 11/25/2017   Memory difficulty 12/14/2013   Neuromuscular disorder (HCC)    sclerosis   Osteoporosis    Peptic ulcer    PONV (postoperative nausea and vomiting)    Pseudogout    Raynaud phenomenon    Rectal bleeding    Sleep apnea    a. on cpap.   SVT (supraventricular tachycardia)    Syncope 09/10/2015   Thyroid disease    hypothyroidism   Tremor, essential 05/14/2016     Family History  Problem Relation Age of Onset   Heart attack Mother    Stroke Mother    Diabetes Mother    Heart disease Mother    Colon polyps Mother    Asthma Mother    Dementia Mother    Alzheimer's disease Mother    Heart disease Father    Heart attack Father    Asthma Father    Stroke Sister    Hypertension Sister    Rheum arthritis Sister    Dementia Sister    Asthma Sister    Parkinson's disease Sister    Lupus Sister    Asthma Sister    Breast cancer Maternal Grandmother    Wilson's disease Maternal Grandmother    Pancreatic cancer Maternal Grandfather    Autoimmune disease Daughter        in eyes   Arthritis Daughter    Deep vein thrombosis Daughter    Past Surgical History:  Procedure Laterality Date   APPENDECTOMY     BLADDER SURGERY     x2  BLADDER SURGERY     BREAST BIOPSY     CARDIAC CATHETERIZATION     CARPAL TUNNEL RELEASE     CATARACT EXTRACTION Bilateral    CHOLECYSTECTOMY     DILATION AND CURETTAGE OF UTERUS     ENTEROCELE REPAIR     x2   EYE SURGERY     retina   herniated disc  11/25/2021   L4 and L5   hysterectomy - unknown type     JOINT REPLACEMENT     KNEE ARTHROPLASTY     KNEE SURGERY     x6   NECK SURGERY     fusion   OOPHORECTOMY     QUADRICEPS REPAIR Right    RECTOCELE REPAIR     x2   SHOULDER ARTHROSCOPY WITH SUBACROMIAL DECOMPRESSION, ROTATOR CUFF REPAIR AND BICEP TENDON REPAIR  10/06/2012   Procedure: SHOULDER ARTHROSCOPY WITH SUBACROMIAL DECOMPRESSION, ROTATOR CUFF REPAIR AND BICEP TENDON REPAIR;  Surgeon: Mable Paris, MD;  Location: [facility];  Service: Orthopedics;  Laterality: Right;  Arthroscopic  Repair  of  Subscapularis, Open Biceps Tenodesis   SHOULDER SURGERY     bilateral- bones spur   SHOULDER SURGERY Left 04/15/2021   TONSILLECTOMY     TOTAL KNEE ARTHROPLASTY     TOTAL SHOULDER ARTHROPLASTY     Social History   Social History Narrative   Lives with her husband. She likes  to hang out with her friends and play games and do bible study.   Immunization History  Administered Date(s) Administered   Fluad Quad(high Dose 65+) 07/13/2019, 07/10/2020, 07/22/2021, 07/22/2022   Hepatitis B, ADULT 02/25/2021, 03/28/2021, 08/26/2021   Influenza Split 07/26/2012, 06/11/2014, 07/25/2015, 06/23/2018   Influenza Whole 07/30/2009, 08/19/2010   Influenza, High Dose Seasonal PF 07/06/2016, 07/05/2017   Influenza,inj,Quad PF,6+ Mos 06/11/2014, 07/25/2015, 06/23/2018   Influenza-Unspecified 07/26/2012, 07/26/2013, 09/05/2013, 06/11/2014, 07/25/2015, 06/23/2018, 06/27/2019, 07/13/2019, 07/10/2020, 07/22/2021   Moderna Sars-Covid-2 Vaccination 02/29/2020, 03/28/2020, 10/04/2020   Pneumococcal Conjugate-13 01/04/2017   Pneumococcal Polysaccharide-23 07/30/2009, 11/14/2018   Pneumococcal-Unspecified 10/27/2015   Tdap 02/13/2008, 05/12/2018   Zoster, Live 09/01/2011     Objective: Vital Signs: BP 126/78 (BP Location: Left Arm, Patient Position: Sitting, Cuff Size: Normal)   Pulse (!) 58   Resp 16   Ht 5\' 6"  (1.676 m)   Wt 176 lb (79.8 kg)   BMI 28.41 kg/m    Physical Exam Vitals and nursing note reviewed.  Constitutional:      Appearance: She is well-developed.  HENT:     Head: Normocephalic and atraumatic.  Eyes:     Conjunctiva/sclera: Conjunctivae normal.  Cardiovascular:     Rate and Rhythm: Normal rate and regular rhythm.     Heart sounds: Normal heart sounds.  Pulmonary:     Effort: Pulmonary effort is normal.     Breath sounds: Normal breath sounds.  Abdominal:     General: Bowel sounds are normal.     Palpations: Abdomen is soft.  Musculoskeletal:     Cervical back: Normal range of motion.  Lymphadenopathy:     Cervical: No cervical adenopathy.  Skin:    General: Skin is warm and dry.     Capillary Refill: Capillary refill takes less than 2 seconds.  Neurological:     Mental Status: She is alert and oriented to person, place, and time.  Psychiatric:         Behavior: Behavior normal.      Musculoskeletal Exam: Generalized hyperalgesia and positive tender points on  exam.  C-spine has limited lateral rotation especially to the left.  Trapezius muscle tension and tenderness bilaterally.  Painful and limited mobility of the lumbar spine.  Shoulder joints have good range of motion with some stiffness bilaterally.  Elbow joints have good range of motion with no tenderness or inflammation along the elbow joint line.  Tenderness over the medial epicondyle of both elbows.  Wrist joints, MCPs, PIPs, DIPs have good range of motion with no synovitis.  PIP and DIP thickening consistent with osteoarthritis of both hands.  Tenderness over bilateral first MCP joints and the right third MCP joint.  Hip joints have good range of motion with no groin pain.  Some tenderness over bilateral trochanteric bursa.  Right knee replacement has good range of motion.  Left knee joint has good range of motion with some discomfort but no warmth or effusion noted.  Ankle joints have good range of motion with no tenderness or joint swelling.  CDAI Exam: CDAI Score: 3.6  Patient Global: 3 mm; Provider Global: 3 mm Swollen: 0 ; Tender: 3  Joint Exam 03/26/2023      Right  Left  MCP 1   Tender   Tender  MCP 3   Tender        Investigation: No additional findings.  Imaging: MR Brain W Wo Contrast  Result Date: 03/01/2023 CLINICAL DATA:  Provided history: Neuro deficit, acute, stroke suspected. Additional history provided: Episode of acute confusion and dizziness on 04/25. EXAM: MRI HEAD WITHOUT AND WITH CONTRAST TECHNIQUE: Multiplanar, multiecho pulse sequences of the brain and surrounding structures were obtained without and with intravenous contrast. CONTRAST:  7.11mL GADAVIST GADOBUTROL 1 MMOL/ML IV SOLN COMPARISON:  Brain MRI 09/01/2020. FINDINGS: Brain: No age advanced or lobar predominant parenchymal atrophy. Multifocal T2 FLAIR hyperintense signal abnormality within the  cerebral white matter, nonspecific but compatible with minimal chronic small vessel ischemic disease. Punctate focus of T2* signal loss within the left basal ganglia, which may reflect mineralization or a chronic microhemorrhage. There is no acute infarct. No evidence of an intracranial mass. No extra-axial fluid collection. No midline shift. No pathologic intracranial enhancement identified. Vascular: Maintained flow voids within the proximal large arterial vessels. Skull and upper cervical spine: No focal suspicious marrow lesion. Sinuses/Orbits: No mass or acute finding within the imaged orbits. Prior bilateral ocular lens replacement. No significant paranasal sinus disease. IMPRESSION: 1.  No evidence of an acute intracranial abnormality. 2. Minimal chronic small vessel ischemic changes within the cerebral white matter, similar to the prior brain MRI of 09/01/2020. Electronically Signed   By: Jackey Loge D.O.   On: 03/01/2023 16:59    Recent Labs: Lab Results  Component Value Date   WBC 7.1 02/24/2023   HGB 12.6 02/24/2023   PLT 466 (H) 02/24/2023   NA 135 02/24/2023   K 4.8 02/24/2023   CL 97 (L) 02/24/2023   CO2 27 02/24/2023   GLUCOSE 61 (L) 02/24/2023   BUN 12 02/24/2023   CREATININE 0.80 02/24/2023   BILITOT 0.4 02/24/2023   ALKPHOS 137 (H) 09/19/2022   AST 36 (H) 02/24/2023   ALT 52 (H) 02/24/2023   PROT 7.1 02/24/2023   ALBUMIN 2.9 (L) 09/19/2022   CALCIUM 9.9 02/24/2023   GFRAA 94 03/10/2021    Speciality Comments: No specialty comments available.  Procedures:  No procedures performed Allergies: Codeine, Myrbetriq  [mirabegron], Nystatin, Percocet [oxycodone-acetaminophen], Propoxyphene, Erythromycin base, Ketorolac, Oxycodone-acetaminophen, Celecoxib, Darvocet [propoxyphene n-acetaminophen], Erythromycin, Hydrocodone, Nitrofurantoin, Pregabalin, Toradol [ketorolac tromethamine], Tramadol, and  Verapamil   Assessment / Plan:     Visit Diagnoses: Seronegative rheumatoid  arthritis (HCC): No synovitis on examination today.  Synovial thickening and tenderness of bilateral first MCP joints.  Patient remains on Arava 20 mg 1 tablet by mouth daily.  She is tolerating Arava without any side effects and has not missed any doses recently.  She has been trying to avoid the use of Tylenol and NSAID use.  Discussed that if her symptoms persist or worsen we can schedule an ultrasound-guided left first MCP joint cortisone injection in the future.  She will remain on her Reyvow as prescribed for now.  Hepatic function panel will be rechecked today.  She will follow-up in the office in 5 months or sooner if needed.  High risk medication use - Arava 20 mg 1 tablet by mouth daily.  CBC and CMP updated on 02/24/23.  Her next lab work will be due in August and every 3 months.  Hepatic function panel will be rechecked today.  Patient has rarely been taking Tylenol and avoids NSAID use.  She has not been drinking any alcohol recently. No recent or recurrent infections. Discussed the importance of holding arava if she develops signs or symptoms of an infection and to resume once the infection has completely cleared.  - Plan: Hepatic function panel  Elevated LFTs -AST 36 and ALT 52 on 03/05/2023.  She has not been taking any Tylenol, NSAID use, or alcohol use recently.  Hepatic function panel updated today. Plan: Hepatic function panel  Raynaud's phenomenon without gangrene: Not currently active.  No digital ulcers.   S/P arthroscopy of left shoulder - Dr. Theophilus Bones on 04/15/2021.  She had left shoulder joint cortisone injection by Dr. Theophilus Bones in February 2023.  Stiffness with ROM.  Tenderness upon palpation.   Trochanteric bursitis of both hips: She has intermittent tenderness to palpation over bilateral trochanteric bursa.  Discussed that she could benefit from physical therapy.  She will notify us when and if she would like referral placed to physical therapy.  H/O total knee replacement,  right: Good range of motion of the right knee replacement.  No warmth or effusion noted.  Primary osteoarthritis of left knee: Chronic pain.  No warmth or effusion noted.  DDD (degenerative disc disease), cervical: C-spine has limited range of motion with lateral rotation.  DDD (degenerative disc disease), thoracic: Posterior thoracic kyphosis noted.  DDD (degenerative disc disease), lumbar - - Status post lumbar microdiscectomy left L4-5 on November 25, 2021.  She is followed by Dr. Franky Macho.  Chronic pain.  Patient states that she will require a lumbar fusion in the future but is not ready to proceed with surgery at this time.  Pseudogout: No recent pseudogout flares.   History of foot fracture  Other osteoporosis without current pathological fracture - Followed by Dr. Lonzo Cloud for management of osteoporosis.  Previous therapy alendronate, Prolia IV Reclast.Received 1st dose of zoledronic acid 01/06/2022  Fibromyalgia: She has generalized hyperalgesia and positive tender points on exam.  She continues to have chronic fatigue and intermittent bouts of fibro fog.  Overall she has been sleeping well at night.  Discussed that she may benefit from going to integrative therapies for massage therapy and dry needling.  She could also try incorporating massage therapy on a maintenance basis once monthly.  I also offered a referral to [facility] for aquatic therapy.  She will notify us when and if she would like referral placed to physical therapy in the future.  Discussed the importance of regular exercise and good sleep hygiene.  Chronic fatigue: Chronic.  Discussed the importance of regular exercise and good sleep hygiene.   Other medical conditions are listed as follows:  History of gastroesophageal reflux (GERD)  History of hypertension: BP 126/78 today in the office.   History of CHF (congestive heart failure)  IC (interstitial cystitis)  History of Clostridium difficile colitis  S/P  carpal tunnel release  Orders: Orders Placed This Encounter  Procedures   Hepatic function panel   No orders of the defined types were placed in this encounter.    Follow-Up Instructions: Return in about 5 months (around 08/26/2023) for Rheumatoid arthritis, DDD.   Gearldine Bienenstock, PA-C  Note - This record has been created using Dragon software.  Chart creation errors have been sought, but may not always  have been located. Such creation errors do not reflect on  the standard of medical care.

## 2023-03-16 DIAGNOSIS — Z6828 Body mass index (BMI) 28.0-28.9, adult: Secondary | ICD-10-CM | POA: Diagnosis not present

## 2023-03-16 DIAGNOSIS — M5126 Other intervertebral disc displacement, lumbar region: Secondary | ICD-10-CM | POA: Diagnosis not present

## 2023-03-16 DIAGNOSIS — M4126 Other idiopathic scoliosis, lumbar region: Secondary | ICD-10-CM | POA: Diagnosis not present

## 2023-03-24 DIAGNOSIS — R7989 Other specified abnormal findings of blood chemistry: Secondary | ICD-10-CM | POA: Diagnosis not present

## 2023-03-24 DIAGNOSIS — K580 Irritable bowel syndrome with diarrhea: Secondary | ICD-10-CM | POA: Diagnosis not present

## 2023-03-24 DIAGNOSIS — K219 Gastro-esophageal reflux disease without esophagitis: Secondary | ICD-10-CM | POA: Diagnosis not present

## 2023-03-26 ENCOUNTER — Ambulatory Visit: Payer: Medicare Other | Attending: Physician Assistant | Admitting: Physician Assistant

## 2023-03-26 ENCOUNTER — Encounter: Payer: Self-pay | Admitting: Physician Assistant

## 2023-03-26 VITALS — BP 126/78 | HR 58 | Resp 16 | Ht 66.0 in | Wt 176.0 lb

## 2023-03-26 DIAGNOSIS — M0600 Rheumatoid arthritis without rheumatoid factor, unspecified site: Secondary | ICD-10-CM | POA: Diagnosis not present

## 2023-03-26 DIAGNOSIS — M7061 Trochanteric bursitis, right hip: Secondary | ICD-10-CM

## 2023-03-26 DIAGNOSIS — M5136 Other intervertebral disc degeneration, lumbar region: Secondary | ICD-10-CM | POA: Diagnosis not present

## 2023-03-26 DIAGNOSIS — M1120 Other chondrocalcinosis, unspecified site: Secondary | ICD-10-CM | POA: Diagnosis not present

## 2023-03-26 DIAGNOSIS — Z8679 Personal history of other diseases of the circulatory system: Secondary | ICD-10-CM | POA: Diagnosis not present

## 2023-03-26 DIAGNOSIS — M7062 Trochanteric bursitis, left hip: Secondary | ICD-10-CM

## 2023-03-26 DIAGNOSIS — Z8781 Personal history of (healed) traumatic fracture: Secondary | ICD-10-CM

## 2023-03-26 DIAGNOSIS — M818 Other osteoporosis without current pathological fracture: Secondary | ICD-10-CM

## 2023-03-26 DIAGNOSIS — I7300 Raynaud's syndrome without gangrene: Secondary | ICD-10-CM | POA: Diagnosis not present

## 2023-03-26 DIAGNOSIS — R5382 Chronic fatigue, unspecified: Secondary | ICD-10-CM | POA: Diagnosis not present

## 2023-03-26 DIAGNOSIS — M1712 Unilateral primary osteoarthritis, left knee: Secondary | ICD-10-CM

## 2023-03-26 DIAGNOSIS — N3010 Interstitial cystitis (chronic) without hematuria: Secondary | ICD-10-CM | POA: Diagnosis not present

## 2023-03-26 DIAGNOSIS — M797 Fibromyalgia: Secondary | ICD-10-CM

## 2023-03-26 DIAGNOSIS — M5134 Other intervertebral disc degeneration, thoracic region: Secondary | ICD-10-CM | POA: Diagnosis not present

## 2023-03-26 DIAGNOSIS — M5030 Other cervical disc degeneration, unspecified cervical region: Secondary | ICD-10-CM

## 2023-03-26 DIAGNOSIS — Z96651 Presence of right artificial knee joint: Secondary | ICD-10-CM | POA: Diagnosis not present

## 2023-03-26 DIAGNOSIS — Z79899 Other long term (current) drug therapy: Secondary | ICD-10-CM | POA: Diagnosis not present

## 2023-03-26 DIAGNOSIS — Z8619 Personal history of other infectious and parasitic diseases: Secondary | ICD-10-CM | POA: Diagnosis not present

## 2023-03-26 DIAGNOSIS — Z9889 Other specified postprocedural states: Secondary | ICD-10-CM | POA: Diagnosis not present

## 2023-03-26 DIAGNOSIS — R7989 Other specified abnormal findings of blood chemistry: Secondary | ICD-10-CM

## 2023-03-26 DIAGNOSIS — Z8719 Personal history of other diseases of the digestive system: Secondary | ICD-10-CM

## 2023-03-26 DIAGNOSIS — M51369 Other intervertebral disc degeneration, lumbar region without mention of lumbar back pain or lower extremity pain: Secondary | ICD-10-CM

## 2023-03-26 NOTE — Patient Instructions (Signed)
Standing Labs We placed an order today for your standing lab work.   Please have your standing labs drawn in August and every 3 months   Please have your labs drawn 2 weeks prior to your appointment so that the provider can discuss your lab results at your appointment, if possible.  Please note that you may see your imaging and lab results in MyChart before we have reviewed them. We will contact you once all results are reviewed. Please allow our office up to 72 hours to thoroughly review all of the results before contacting the office for clarification of your results.  WALK-IN LAB HOURS  Monday through Thursday from 8:00 am -12:30 pm and 1:00 pm-5:00 pm and Friday from 8:00 am-12:00 pm.  Patients with office visits requiring labs will be seen before walk-in labs.  You may encounter longer than normal wait times. Please allow additional time. Wait times may be shorter on  Monday and Thursday afternoons.  We do not book appointments for walk-in labs. We appreciate your patience and understanding with our staff.   Labs are drawn by [facility]. Please bring your co-pay at the time of your lab draw.  You may receive a bill from [facility] for your lab work.  Please note if you are on Hydroxychloroquine and and an order has been placed for a Hydroxychloroquine level,  you will need to have it drawn 4 hours or more after your last dose.  If you wish to have your labs drawn at another location, please call the office 24 hours in advance so we can fax the orders.  The office is located at [address], [address], [place], [place] [address]   If you have any questions regarding directions or hours of operation,  please call [phone].   As a reminder, please drink plenty of water prior to coming for your lab work. Thanks!  

## 2023-03-27 LAB — HEPATIC FUNCTION PANEL
AG Ratio: 1.7 (calc) (ref 1.0–2.5)
ALT: 47 U/L — ABNORMAL HIGH (ref 6–29)
AST: 37 U/L — ABNORMAL HIGH (ref 10–35)
Albumin: 4 g/dL (ref 3.6–5.1)
Alkaline phosphatase (APISO): 121 U/L (ref 37–153)
Bilirubin, Direct: 0.1 mg/dL (ref 0.0–0.2)
Globulin: 2.3 g/dL (calc) (ref 1.9–3.7)
Indirect Bilirubin: 0.3 mg/dL (calc) (ref 0.2–1.2)
Total Bilirubin: 0.4 mg/dL (ref 0.2–1.2)
Total Protein: 6.3 g/dL (ref 6.1–8.1)

## 2023-03-29 ENCOUNTER — Ambulatory Visit (INDEPENDENT_AMBULATORY_CARE_PROVIDER_SITE_OTHER): Payer: Medicare Other | Admitting: Family Medicine

## 2023-03-29 ENCOUNTER — Telehealth: Payer: Self-pay | Admitting: Family Medicine

## 2023-03-29 VITALS — BP 107/71 | HR 82

## 2023-03-29 DIAGNOSIS — E538 Deficiency of other specified B group vitamins: Secondary | ICD-10-CM

## 2023-03-29 MED ORDER — CYANOCOBALAMIN 1000 MCG/ML IJ SOLN
1000.0000 ug | Freq: Once | INTRAMUSCULAR | Status: AC
Start: 2023-03-29 — End: 2023-03-29
  Administered 2023-03-29: 1000 ug via INTRAMUSCULAR

## 2023-03-29 NOTE — Progress Notes (Signed)
Agree with documentation as above.   Doneta Bayman, MD  

## 2023-03-29 NOTE — Telephone Encounter (Signed)
Call patient and see where she had her last eye exam done.  If she is to then please encourage her to schedule.  Or we can replace the referral if needed.

## 2023-03-29 NOTE — Progress Notes (Signed)
   Established Patient Office Visit  Subjective   Patient ID: Mia [name], female    DOB: Sep 12, 1951  Age: 72 y.o. MRN: [id number]  Chief Complaint  Patient presents with   Pernicious Anemia    HPI  Mia [name] is here for a vitamin B 12 injection. Denies muscle cramps, weakness or irregular heart rate.  ROS    Objective:     BP 107/71   Pulse 82   SpO2 97%    Physical Exam   No results found for any visits on 03/29/23.    The 10-year ASCVD risk score (Arnett DK, et al., 2019) is: 18.2%    Assessment & Plan:  B12 injection - Patient tolerated injection well without complications. Patient advised to schedule next injection 30 days from today.    Problem List Items Addressed This Visit       Unprioritized   B12 deficiency - Primary    Return in about 1 month (around 04/28/2023) for B12 injection. Earna Coder, Janalyn Harder, CMA

## 2023-03-29 NOTE — Progress Notes (Signed)
AST and ALT remain elevated-recommend reducing arava from 20 mg daily to 10 mg alternating with 20 mg every other day.  Repeat hepatic function panel in 3-4 weeks.  Avoid tylenol and alcohol use.  Please also forward results to Dr. Linford Arnold as requested by the patient.

## 2023-03-30 ENCOUNTER — Telehealth: Payer: Self-pay | Admitting: *Deleted

## 2023-03-30 DIAGNOSIS — Z79899 Other long term (current) drug therapy: Secondary | ICD-10-CM

## 2023-03-30 NOTE — Telephone Encounter (Signed)
Faxed request to [facility]  for last DM eye exam.

## 2023-03-30 NOTE — Telephone Encounter (Signed)
-----   Message from Gearldine Bienenstock, PA-C sent at 03/29/2023  4:26 PM EDT ----- AST and ALT remain elevated-recommend reducing arava from 20 mg daily to 10 mg alternating with 20 mg every other day.  Repeat hepatic function panel in 3-4 weeks.  Avoid tylenol and alcohol use.  Please also forward results to Dr. Linford Arnold as requested by the patient.

## 2023-04-05 ENCOUNTER — Other Ambulatory Visit: Payer: Self-pay | Admitting: Neurosurgery

## 2023-04-20 ENCOUNTER — Ambulatory Visit: Payer: Medicare Other | Admitting: Adult Health

## 2023-04-21 ENCOUNTER — Other Ambulatory Visit: Payer: Self-pay | Admitting: Family Medicine

## 2023-04-21 ENCOUNTER — Other Ambulatory Visit: Payer: Self-pay | Admitting: Physician Assistant

## 2023-04-21 ENCOUNTER — Other Ambulatory Visit: Payer: Self-pay | Admitting: Medical-Surgical

## 2023-04-21 DIAGNOSIS — N3000 Acute cystitis without hematuria: Secondary | ICD-10-CM

## 2023-04-27 ENCOUNTER — Encounter: Payer: Self-pay | Admitting: Neurology

## 2023-04-27 ENCOUNTER — Ambulatory Visit (INDEPENDENT_AMBULATORY_CARE_PROVIDER_SITE_OTHER): Payer: Medicare Other | Admitting: Neurology

## 2023-04-27 ENCOUNTER — Telehealth: Payer: Self-pay | Admitting: Family Medicine

## 2023-04-27 ENCOUNTER — Ambulatory Visit (INDEPENDENT_AMBULATORY_CARE_PROVIDER_SITE_OTHER): Payer: Medicare Other | Admitting: Family Medicine

## 2023-04-27 VITALS — BP 107/65 | HR 71 | Temp 97.6°F | Ht 66.0 in | Wt 169.0 lb

## 2023-04-27 VITALS — BP 137/75 | HR 57 | Ht 66.0 in | Wt 169.0 lb

## 2023-04-27 DIAGNOSIS — E538 Deficiency of other specified B group vitamins: Secondary | ICD-10-CM

## 2023-04-27 DIAGNOSIS — Z9181 History of falling: Secondary | ICD-10-CM | POA: Diagnosis not present

## 2023-04-27 DIAGNOSIS — R2689 Other abnormalities of gait and mobility: Secondary | ICD-10-CM | POA: Diagnosis not present

## 2023-04-27 MED ORDER — CYANOCOBALAMIN 1000 MCG/ML IJ SOLN
1000.0000 ug | Freq: Once | INTRAMUSCULAR | Status: AC
Start: 2023-04-27 — End: 2023-04-27
  Administered 2023-04-27: 1000 ug via INTRAMUSCULAR

## 2023-04-27 NOTE — Patient Instructions (Signed)
I believe you have a multifactorial gait disorder, meaning, that it is Mia [name] is due to a combination of factors. These factors include:   Orthopedic issues including knee problems, low back pain with degenerative arthritis of your back, and medication effects from gabapentin and mysoline/primidone.   You had a recent brain MRI; I do not see a reason to repeat a brain scan.   Please remember to stand up slowly and get your bearings first turn slowly, no bending down to pick anything, no heavy lifting, be extra careful at night and first thing in the morning. Also, be careful in the Bathroom and the kitchen.   Remember to drink plenty of fluid, eat healthy meals and do not skip any meals. Try to eat protein with a every meal and eat a healthy snack such as fruit or nuts or yogurt in between meals. Try to keep a regular sleep-wake schedule and try to exercise daily, within your limitation of course.   I recommend you use your walker at all times.   Talk to your prescribing provider about reducing the gabapentin.   We can consider reducing the primidone.   As far as your medications are concerned, I would like to suggest no new medications.   You can follow up with Megan in about 6 months.

## 2023-04-27 NOTE — Progress Notes (Signed)
Subjective:    Patient ID: Mia [name], female    DOB: January 01, 1951, 72 y.o.   MRN: [id number]  HPI    Interim history:   [name] is a 72 year old female with an underlying complex medical history of arthritis, with degenerative cervical and lumbar disc disease, fibromyalgia, diabetes, reflux disease, hypertension, interstitial cystitis, memory loss, tremors, osteoporosis, pseudogout, SVT, syncope, anemia, history of C. difficile colitis, and overweight state, who presents for a new problem visit of dizziness and balance issues. The patient is accompanied by her daughter today. She missed an appointment on 03/04/2023. She has been followed in our clinic previously by Dr. Anne Hahn and recently by Everlene Other, NP for memory loss and tremors.  She last saw Dr. Anne Hahn on 12/27/2019, she was last seen by Everlene Other, NP on 12/22/2022.  I reviewed the notes and copied the notes below for reference.  Today, 04/27/2023: She reports feeling off balance for at least a year.  She has fallen several times.  She does not typically use a walker but has 2 types of walkers, she has a rolling walker with seat and also a 2 wheeled walker.  She has fallen at night, she is also fallen out of bed.  She now has a bed rail.  She was advised at the last visit in our clinic to use a walker but has not consistently used a walker, typically does not use any walking aid.  She has significant knee issues, including meniscus issues on the left, multiple knee surgeries altogether, needs low back surgery and is actually scheduled for lumbar spine surgery in August 2024 with Dr. Coletta Memos.   She has been on Mysoline for tremor particularly head tremor.  She has been on gabapentin which is currently 300 mg 3 times daily.  She reports that she was on 1200 mg before.  She reports hydrating well.  She drinks about 4-5 tall glasses of water per day.  She drinks 1 cup of coffee in the morning, no alcohol.   She saw her primary care  on 02/24/2023 and I reviewed the office note.  She was complaining of dizziness at the time.  She reported an episode of chills in the middle of the night in late April and felt confused the next day.  She felt like she was slurring her speech.  A brain MRI was ordered.  She had a brain MRI with and without contrast on 02/28/2023 and I reviewed the results:   IMPRESSION: 1.  No evidence of an acute intracranial abnormality. 2. Minimal chronic small vessel ischemic changes within the cerebral white matter, similar to the prior brain MRI of 09/01/2020.  She had a prior brain MRI with and without contrast on 09/01/2020 and I reviewed the results:  IMPRESSION: This MRI of the brain with and without contrast shows the following: 1.   Minimal generalized cortical atrophy, typical for age but slightly progressed compared to the 2016 MRI 2.   Few punctate T2/FLAIR hyperintense foci the subcortical white matter.  This is a nonspecific finding but most likely represents minimal chronic microvascular ischemic change, typical for age. 3.   There is a normal enhancement pattern and no acute findings.    She has been receiving vitamin B12 injections.  Last injection was on 03/01/2023.  Recent laboratory testing from 02/24/2023 was reviewed.  CBC with differential showed mildly elevated platelets at 466 and elevated monocytes.  CMP showed low glucose at 61, BUN 12, creatinine 0.8,  chloride 97, borderline below normal.  AST elevated at 36 which is borderline high, ALT mildly high at 52.  Vitamin B12 level 439.  TSH was normal at 1.3.  Of note, she is on multiple medications including potentially sedating medications.  She is currently on primidone 50 mg at bedtime for tremor, she is on Effexor long-acting 75 mg daily, she takes a beta-blocker per cardiology.  She is on Namenda generic 10 mg twice daily as well as Aricept generic 10 mg once daily.  She is on gabapentin 300 mg strength up to 600 mg twice daily.    Previously:  12/21/22 Butch Penny, NP): <<Mia [name] is a 72 y.o. female with a history of tremor, fibromyalgia and memory disturbance. Returns today for follow-up.      Tremor: Currently taking Primidone 50 mg at bedtime. Has good days and bad days. Definitely has more good days than bad days.    Fibromyalgia: Currently on Effexor 75 mg daily. Has a lot of tenderness in the legs. Was on Lyrica for a while and it worked great but then she couldn't tolerate it. Cymbalta increased her blood pressure. She does see Dr. Corliss Skains who treats her RA.    Memory: Currently on Aricept 10 mg at bedtime and namenda 10 mg BID. Feels that she has good days and bad days. Has trouble with short term things and some long term memory. . Able to complete all ADLs independently. Needs some help with household chores but this is due her back. She does write notes in order to remember.  Manages her medications, appointments, and finances.    Has been having trouble with HTN- PCP is adjusting medication.  Continues to have back pain but this is currently being managed by [facility]>>  12/27/2019 (Dr. Stephanie Acre): <<[name] is a 68 year old right-handed white female with a history of a mild memory disturbance, she is on Aricept and Namenda.  She contracted the Covid virus and required hospitalization on 06 November 2019.  The patient has had some increase in fatigue and some increased cognitive slowing since that time.  She has had ongoing pain in the neck and low back, she is followed through an orthopedic surgeon.  She had a recent MRI of the cervical and lumbar spine done through [facility] in [place].  The results of the studies are not available to me.  The patient does report some gait instability, she has not had any recent falls.  She does not use a cane.  She returns the office today for an evaluation.  She is on very low-dose Mysoline taking 50 mg at night.>> The patient's allergies, current  medications, family history, past medical history, past social history, past surgical history and problem list were reviewed and updated as appropriate.    Her Past Medical History Is Significant For: Past Medical History:  Diagnosis Date   Anal fissure    Anemia    Arthritis    Blood transfusion    C. difficile colitis    Chest pain    a. 01/2013 MV: EF 59%, no ischemia.   Chronic Dyspnea    a. 01/2013 Echo: EF 60-65%, Gr 1 DD, PASP 2mmHg.// Echo 01/2020: EF 60-65, no RWMA, GR 1 DD, GLS -21.6%, normal RV SF, trivial MR    COVID-19    DDD (degenerative disc disease), cervical    DDD (degenerative disc disease), lumbar    Diabetes mellitus    Fibromyalgia    Gall stones  GERD (gastroesophageal reflux disease)    gastritis   Hemorrhoids    Hypertension    Interstitial cystitis    MCI (mild cognitive impairment) 11/25/2017   Memory difficulty 12/14/2013   Neuromuscular disorder (HCC)    sclerosis   Osteoporosis    Peptic ulcer    PONV (postoperative nausea and vomiting)    Pseudogout    Raynaud phenomenon    Rectal bleeding    Sleep apnea    a. on cpap.   SVT (supraventricular tachycardia)    Syncope 09/10/2015   Thyroid disease    hypothyroidism   Tremor, essential 05/14/2016    Her Past Surgical History Is Significant For: Past Surgical History:  Procedure Laterality Date   APPENDECTOMY     BLADDER SURGERY     x2   BLADDER SURGERY     BREAST BIOPSY     CARDIAC CATHETERIZATION     CARPAL TUNNEL RELEASE     CATARACT EXTRACTION Bilateral    CHOLECYSTECTOMY     DILATION AND CURETTAGE OF UTERUS     ENTEROCELE REPAIR     x2   EYE SURGERY     retina   herniated disc  11/25/2021   L4 and L5   hysterectomy - unknown type     JOINT REPLACEMENT     KNEE ARTHROPLASTY     KNEE SURGERY     x6   NECK SURGERY     fusion   OOPHORECTOMY     QUADRICEPS REPAIR Right    RECTOCELE REPAIR     x2   SHOULDER ARTHROSCOPY WITH SUBACROMIAL DECOMPRESSION, ROTATOR CUFF REPAIR  AND BICEP TENDON REPAIR  10/06/2012   Procedure: SHOULDER ARTHROSCOPY WITH SUBACROMIAL DECOMPRESSION, ROTATOR CUFF REPAIR AND BICEP TENDON REPAIR;  Surgeon: Mable Paris, MD;  Location: [facility];  Service: Orthopedics;  Laterality: Right;  Arthroscopic  Repair  of  Subscapularis, Open Biceps Tenodesis   SHOULDER SURGERY     bilateral- bones spur   SHOULDER SURGERY Left 04/15/2021   TONSILLECTOMY     TOTAL KNEE ARTHROPLASTY     TOTAL SHOULDER ARTHROPLASTY      Her Family History Is Significant For: Family History  Problem Relation Age of Onset   Heart attack Mother    Stroke Mother    Diabetes Mother    Heart disease Mother    Colon polyps Mother    Asthma Mother    Dementia Mother    Alzheimer's disease Mother    Heart disease Father    Heart attack Father    Asthma Father    Stroke Sister    Hypertension Sister    Rheum arthritis Sister    Dementia Sister    Asthma Sister    Parkinson's disease Sister    Lupus Sister    Asthma Sister    Breast cancer Maternal Grandmother    Wilson's disease Maternal Grandmother    Pancreatic cancer Maternal Grandfather    Autoimmune disease Daughter        in eyes   Arthritis Daughter    Deep vein thrombosis Daughter     Her Social History Is Significant For: Social History   Socioeconomic History   Marital status: Married    Spouse name: Benedetto Goad"   Number of children: 2   Years of education: 14   Highest education level: Associate degree: academic program  Occupational History   Occupation: Retired  Tobacco Use   Smoking status: Never    Passive  exposure: Never   Smokeless tobacco: Never  Vaping Use   Vaping Use: Never used  Substance and Sexual Activity   Alcohol use: No    Alcohol/week: 0.0 standard drinks of alcohol   Drug use: No   Sexual activity: Not on file  Other Topics Concern   Not on file  Social History Narrative   Lives with her husband. She likes to hang out with her  friends and play games and do bible study.   Social Determinants of Health   Financial Resource Strain: Low Risk  (05/05/2022)   Overall Financial Resource Strain (CARDIA)    Difficulty of Paying Living Expenses: Not hard at all  Food Insecurity: No Food Insecurity (05/05/2022)   Hunger Vital Sign    Worried About Running Out of Food in the Last Year: Never true    Ran Out of Food in the Last Year: Never true  Transportation Needs: No Transportation Needs (05/05/2022)   PRAPARE - Administrator, Civil Service (Medical): No    Lack of Transportation (Non-Medical): No  Physical Activity: Inactive (05/05/2022)   Exercise Vital Sign    Days of Exercise per Week: 0 days    Minutes of Exercise per Session: 0 min  Stress: No Stress Concern Present (05/05/2022)   [employer] of Occupational Health - Occupational Stress Questionnaire    Feeling of Stress : Not at all  Social Connections: Socially Integrated (05/05/2022)   Social Connection and Isolation Panel [NHANES]    Frequency of Communication with Friends and Family: More than three times a week    Frequency of Social Gatherings with Friends and Family: More than three times a week    Attends Religious Services: More than 4 times per year    Active Member of [name] or Organizations: Yes    Attends Engineer, structural: More than 4 times per year    Marital Status: Married    Her Allergies Are:  Allergies  Allergen Reactions   Codeine Nausea And Vomiting    Other Reaction(s): UNKNOWN   Myrbetriq  [Mirabegron] Other (See Comments)    SEVERE HEADACHE   Nystatin    Percocet [Oxycodone-Acetaminophen] Nausea And Vomiting   Propoxyphene Nausea And Vomiting    Other Reaction(s): UNKNOWN   Erythromycin Base     Other Reaction(s): UNKNOWN   Ketorolac     Other Reaction(s): UNKNOWN   Oxycodone-Acetaminophen Nausea Only   Celecoxib Other (See Comments)    Other reaction(s): Other  flushed  Other Reaction(s):  UNKNOWN   Darvocet [Propoxyphene N-Acetaminophen] Nausea And Vomiting   Erythromycin Nausea And Vomiting    Nausea and vomiting    Hydrocodone Nausea And Vomiting   Nitrofurantoin Nausea And Vomiting    Other Reaction(s): UNKNOWN   Pregabalin Swelling    Legs swelling  Other Reaction(s): UNKNOWN   Toradol [Ketorolac Tromethamine] Nausea And Vomiting    Per patient, only PO form causes nausea and vomiting. Can take injection without issue   Tramadol Nausea And Vomiting   Verapamil Nausea And Vomiting    REACTION: intolerance  Other Reaction(s): UNKNOWN  :   Her Current Medications Are:  Outpatient Encounter Medications as of 04/27/2023  Medication Sig   albuterol (VENTOLIN HFA) 108 (90 Base) MCG/ACT inhaler Inhale 2 puffs into the lungs as needed.   amLODipine (NORVASC) 5 MG tablet Take 1 tablet (5 mg total) by mouth daily.   Cyanocobalamin (B-12 COMPLIANCE INJECTION IJ) Inject as directed every 30 (thirty) days.  donepezil (ARICEPT) 10 MG tablet TAKE 1 TABLET IN THE EVENING AT BEDTIME   gabapentin (NEURONTIN) 300 MG capsule TAKE 1 TO 2 CAPSULES BY MOUTH UP TO TWO TIMES DAILY AS NEEDED   ibuprofen (ADVIL) 600 MG tablet as needed.   ipratropium (ATROVENT) 0.03 % nasal spray PLACE 2 SPRAYS INTO BOTH NOSTRILS EVERY 12 (TWELVE) HOURS.   leflunomide (ARAVA) 20 MG tablet TAKE 1 TABLET BY MOUTH EVERY DAY   Loratadine (CLARITIN PO) Take by mouth.   Melatonin 10 MG CAPS Take 10 capsules by mouth daily.   memantine (NAMENDA) 10 MG tablet TAKE 1 TABLET BY MOUTH TWO TIMES A DAY **MUST SEE DR FOR FURTHER REFILLS**   metoprolol tartrate (LOPRESSOR) 100 MG tablet 1 AND 1/2 TABLET BY MOUTH 2 TWICE DAILY (Patient taking differently: Take 100 mg by mouth 2 (two) times daily.)   ondansetron (ZOFRAN) 4 MG tablet Take by mouth as needed.   ONETOUCH ULTRA test strip ULTRA 2. CHECK BLOOD GLUCOSE 3 TIMES DAILY.   pantoprazole (PROTONIX) 20 MG tablet Take 20 mg by mouth 2 (two) times daily.   primidone  (MYSOLINE) 50 MG tablet Take 1 tablet (50 mg total) by mouth at bedtime.   valsartan (DIOVAN) 160 MG tablet Take 1 tablet (160 mg total) by mouth 2 (two) times daily.   venlafaxine XR (EFFEXOR-XR) 75 MG 24 hr capsule TAKE 1 CAPSULE DAILY WITH BREAKFAST   zoledronic acid (RECLAST) 5 MG/100ML SOLN injection    [DISCONTINUED] cyclobenzaprine (FLEXERIL) 5 MG tablet Take 5 mg by mouth as needed for muscle spasms.   [DISCONTINUED] Lancets 30G MISC Check blood glucose 3 times daily.   No facility-administered encounter medications on file as of 04/27/2023.  :  Review of Systems:  Out of a complete 14 point review of systems, all are reviewed and negative with the exception of these symptoms as listed below:  Review of Systems  Neurological:        Rm 9. Accompanied by daughter. Worsening balance, slurred speech, confusion.       Objective:   Physical Exam  Physical Examination:   Vitals:   04/27/23 1531  BP: 137/75  Pulse: (!) 57    General Examination: The patient is a very pleasant 72 y.o. female in no acute distress. She appears well-developed and well-nourished and well groomed.   HEENT: Normocephalic, atraumatic, pupils are equal, round and reactive to light, extraocular tracking is well-preserved.  Hearing grossly intact.  Neck is supple with full range of motion, no carotid bruits.  She has an intermittent very mild head tremor.  Face is symmetric with normal facial animation, speech is clear without dysarthria, hypophonia or voice tremor.  Airway examination reveals mild to moderate mouth dryness, tongue protrudes centrally and palate elevates symmetrically.   Chest: Clear to auscultation without wheezing, rhonchi or crackles noted.  Heart: S1+S2+0, regular and normal without murmurs, rubs or gallops noted.  Mild bradycardia noted.  Abdomen: Soft, non-tender and non-distended.  Extremities: There is no pitting edema in the distal lower extremities bilaterally.   Skin: Warm  and dry without trophic changes noted.   Musculoskeletal: exam reveals multiple abdominal scars both knees.  Neurologically:  Mental status: The patient is awake, alert and oriented, she is able to provide her history but details are provided by her daughter.  Thought process is linear. Mood is normal and affect is normal.  Cranial nerves II - XII are as described above under HEENT exam.  Motor exam: Normal bulk, strength and  tone is noted. There is no obvious action or resting tremor.  Fine motor skills and coordination: Finger taps and fine motor skills altogether are mildly impaired in the upper and lower extremities, no lateralization, no decrement in amplitude.   Reflexes are 1+ in the upper extremities and absent in both knees, trace in the ankles, toes are downgoing bilaterally.   Cerebellar testing: No dysmetria or intention tremor. There is no truncal or gait ataxia.  Sensory exam: intact to light touch in the upper and lower extremities.  Gait, station and balance: She stands with mild difficulty and pushes herself up.  She has no walking aid and walks slowly and cautiously, no shuffling, has preserved arm swing.  She has a mild limp on the left.      Assessment & Plan:  In summary, Keria Dlugosz Soman is a very pleasant 72 y.o.-year old female with an underlying complex medical history of arthritis, with degenerative cervical and lumbar disc disease, fibromyalgia, diabetes, reflux disease, hypertension, interstitial cystitis, memory loss, tremors, osteoporosis, pseudogout, SVT, syncope, anemia, history of C. difficile colitis, and overweight state, who presents for presents for evaluation of her gait and balance problems and recurrent falls.  History and examination are not in keeping with parkinsonism.  Her gait and balance issues are mostly multifactorial in nature, particularly secondary to orthopedic issues including bilateral knee problems, knee pain on the left, low back pain with  degenerative arthritis and need for surgery, with surgery pending for August 2024.  She also will need surgery for meniscus repair on the left as I understand.  Medication effect is also likely at play, medications that can affect her balance adversely include gabapentin and Mysoline.  She is on a relatively high dose of gabapentin, she is advised to talk to her prescriber about cutting back on it.  She is advised to use her walker at all times and we talked about the importance of fall prevention.  She already has a bed rail.  She is advised to stay well-hydrated, avoid any muscle relaxer and any sedating medications is much as possible, we can even cut back on the Mysoline which is currently 50 mg once daily.  She had a brain MRI in May 2024, I do not see a pressing reason to repeat a brain scan.   She is a has to follow-up in this clinic to see Butch Penny, NP routinely in 6 months, sooner if needed.  I answered all their questions today and the patient and her daughter were in agreement.  Huston Foley, MD, PhD  I spent 40 minutes in total face-to-face time and in reviewing records during pre-charting, more than 50% of which was spent in counseling and coordination of care, reviewing test results, reviewing medications and treatment regimen and/or in discussing or reviewing the diagnosis of gait disorder, fall risk, the prognosis and treatment options. Pertinent laboratory and imaging test results that were available during this visit with the patient were reviewed by me and considered in my medical decision making (see chart for details).

## 2023-04-27 NOTE — Progress Notes (Signed)
Agree with documentation as above.   Odie Edmonds, MD  

## 2023-04-27 NOTE — Telephone Encounter (Signed)
HI Mia [name], I have the note from the neurologist and they suggest trying to decrease the gabapentin a little bit to see if that would help with some your symptoms.  High doses of gabapentin can affect gait and cause some sedation.  I think it would be worthwhile to try to decrease your medication to see if you notice any improvement.  If you are okay with this then please let me know and I can send over an adjusted prescription.

## 2023-04-27 NOTE — Progress Notes (Signed)
Patient is here for a vitamin B 12 injection. Denies gastrointestinal problems or dizziness.   Patient tolerated injection well without complications on the RUOQ. Patient advised to schedule next injection in 30 days.   

## 2023-04-30 ENCOUNTER — Other Ambulatory Visit: Payer: Self-pay | Admitting: Physician Assistant

## 2023-04-30 ENCOUNTER — Encounter: Payer: Self-pay | Admitting: Family Medicine

## 2023-04-30 NOTE — Telephone Encounter (Signed)
Called pt and LVM advising her of the recommendations and asked that she either return call or send a my chart .

## 2023-04-30 NOTE — Telephone Encounter (Signed)
Last Fill: 02/01/2023  Labs: 02/24/2023  MCHC 31.6 Platelets 466 Absolute Monocytes 1,115 Glucose 61 Chloride 97 AST 36 ALT 52  Next Visit: 08/26/2023  Last Visit: 03/26/2023  DX: Seronegative rheumatoid arthritis   Current Dose per office note 03/26/2023: Arava 20 mg 1 tablet by mouth daily.   Okay to refill Arava ?

## 2023-05-03 NOTE — Telephone Encounter (Signed)
Patient advised.

## 2023-05-03 NOTE — Telephone Encounter (Signed)
Yes okay to decrease down to 1 capsule twice a day or I can send over the lower strength and we can keep it at 3 times a day if she would like.  Either way is fine.

## 2023-05-03 NOTE — Telephone Encounter (Signed)
Pt sent a my chart with the following on 04/27/2023:  I am ok with reducing gabapentin. I now taking 1 3x a day. Can I just cut down to 1 2x a day? I know some got them refilled. Thanks

## 2023-05-03 NOTE — Telephone Encounter (Signed)
There is already a separate note in this regard.

## 2023-05-06 ENCOUNTER — Encounter: Payer: MEDICARE | Attending: Radiation Oncology | Primary: Internal Medicine

## 2023-05-06 DIAGNOSIS — C50512 Malignant neoplasm of lower-outer quadrant of left female breast: Principal | ICD-10-CM

## 2023-05-11 ENCOUNTER — Other Ambulatory Visit: Payer: Self-pay | Admitting: Cardiovascular Disease

## 2023-05-12 DIAGNOSIS — H524 Presbyopia: Secondary | ICD-10-CM | POA: Diagnosis not present

## 2023-05-12 DIAGNOSIS — E119 Type 2 diabetes mellitus without complications: Secondary | ICD-10-CM | POA: Diagnosis not present

## 2023-05-12 DIAGNOSIS — H35371 Puckering of macula, right eye: Secondary | ICD-10-CM | POA: Diagnosis not present

## 2023-05-12 DIAGNOSIS — H18513 Endothelial corneal dystrophy, bilateral: Secondary | ICD-10-CM | POA: Diagnosis not present

## 2023-05-18 ENCOUNTER — Ambulatory Visit (INDEPENDENT_AMBULATORY_CARE_PROVIDER_SITE_OTHER): Payer: Medicare Other | Admitting: Family Medicine

## 2023-05-18 ENCOUNTER — Ambulatory Visit: Payer: Self-pay | Admitting: Licensed Clinical Social Worker

## 2023-05-18 ENCOUNTER — Encounter: Payer: Self-pay | Admitting: Family Medicine

## 2023-05-18 VITALS — BP 108/51 | HR 51 | Wt 177.1 lb

## 2023-05-18 DIAGNOSIS — R748 Abnormal levels of other serum enzymes: Secondary | ICD-10-CM | POA: Diagnosis not present

## 2023-05-18 DIAGNOSIS — R7301 Impaired fasting glucose: Secondary | ICD-10-CM

## 2023-05-18 DIAGNOSIS — R29898 Other symptoms and signs involving the musculoskeletal system: Secondary | ICD-10-CM

## 2023-05-18 DIAGNOSIS — I10 Essential (primary) hypertension: Secondary | ICD-10-CM | POA: Diagnosis not present

## 2023-05-18 DIAGNOSIS — I5032 Chronic diastolic (congestive) heart failure: Secondary | ICD-10-CM | POA: Diagnosis not present

## 2023-05-18 DIAGNOSIS — E538 Deficiency of other specified B group vitamins: Secondary | ICD-10-CM

## 2023-05-18 LAB — POCT GLYCOSYLATED HEMOGLOBIN (HGB A1C): Hemoglobin A1C: 5.8 % — AB (ref 4.0–5.6)

## 2023-05-18 NOTE — Progress Notes (Signed)
Established Patient Office Visit  Subjective   Patient ID: Mia [name], female    DOB: 06/30/1951  Age: 72 y.o. MRN: [id number]  Chief Complaint  Patient presents with   Hypertension   B12 Injection    HPI Hypertension- Pt denies chest pain, SOB, dizziness, or heart palpitations.  Taking meds as directed w/o problems.  Denies medication side effects.    B12 def - recent B12 at goal. Continue monthly injections.   F/U CHF - No change in swelling. Stable   Impaired fasting glucose-no increased thirst or urination. No symptoms consistent with hypoglycemia.  She is concerned about her elevation in liver enyzmes .    ROS    Objective:     BP (!) 108/51 (BP Location: Left Arm, Patient Position: Sitting, Cuff Size: Normal)   Pulse (!) 51   Wt 177 lb 1.3 oz (80.3 kg)   SpO2 98%   BMI 28.58 kg/m     Physical Exam Vitals and nursing note reviewed.  Constitutional:      Appearance: She is well-developed.  HENT:     Head: Normocephalic and atraumatic.  Cardiovascular:     Rate and Rhythm: Normal rate and regular rhythm.     Heart sounds: Normal heart sounds.  Pulmonary:     Effort: Pulmonary effort is normal.     Breath sounds: Normal breath sounds.  Skin:    General: Skin is warm and dry.  Neurological:     Mental Status: She is alert and oriented to person, place, and time.  Psychiatric:        Behavior: Behavior normal.     Results for orders placed or performed in visit on 05/18/23  POCT HgB A1C  Result Value Ref Range   Hemoglobin A1C 5.8 (A) 4.0 - 5.6 %   HbA1c POC (<> result, manual entry)     HbA1c, POC (prediabetic range)     HbA1c, POC (controlled diabetic range)         The 10-year ASCVD risk score (Arnett DK, et al., 2019) is: 18.5%    Assessment & Plan:   Problem List Items Addressed This Visit       Cardiovascular and Mediastinum   Hypertension - Primary    Blood pressure is little borderline low today but at other times she has not  spiked.  In fact recently she said she just was not feeling well and felt a little lightheaded and checked it and it was over 200.  Nothing was different or unusual that day she had not missed any of her medications.  She did take an extra amlodipine.  But was asking about the valsartan.  We did discuss that she is already on the max dose of valsartan as she does take it twice a day so the only 1 that she really has extra wiggle room on would be the amlodipine and it is safe to take an extra 1 if needed.      Chronic diastolic CHF (congestive heart failure) (HCC)    No sign of volume overload on exams.  No significant edema of either ankle.        Endocrine   IFG (impaired fasting glucose)    1C today was 5.8 which is significantly elevated from prior last fall.  Just encouraged her to continue to work on healthy food choices and staying active.  Plan to recheck in 6 months.  Lab Results  Component Value Date   HGBA1C 5.8 (A) 05/18/2023  Relevant Orders   POCT HgB A1C (Completed)     Other   Weakness of both lower extremities    A long discussion we had a long discussion today about using a walker.  She really continues to fall multiple times in part because of balance issues and lower extremity weakness.  She is likely going to have spine surgery in the next month.  Hopefully will help her pain but I do not suspect that it would probably help with her gait or her weakness.  Hopefully she will get some formal physical therapy afterwards.  But again she does need to be using her walker      B12 deficiency    Lasb B12 looks great continue monthly injections.      Other Visit Diagnoses     Elevated liver enzymes       Relevant Orders   US ABDOMEN COMPLETE W/ELASTOGRAPHY       Return in about 6 months (around 11/18/2023) for Hypertension.   I spent 40 minutes on the day of the encounter to include pre-visit record review, face-to-face time with the patient and post visit  ordering of test.   Nani Gasser, MD

## 2023-05-18 NOTE — Patient Outreach (Signed)
  Care Coordination   Initial Visit Note   05/18/2023 Name: Mia [name] MRN: [id number] DOB: 05/30/1951  Mia [name] is a 72 y.o. year old female who sees Metheney, Barbarann Ehlers, MD for primary care. I spoke with  Mia [name] by phone today.  What matters to the patients health and wellness today?  Patient wants to talk with PCP about program support     Goals Addressed             This Visit's Progress    Patient Stated she wanted to talk with PCP about program support       Interventions:  Spoke with client via phone today about client needs Discussed program support with client. Described roles of RN, LCSW, Pharmacist in program support Discussed PCP support of client.  Client sees Dr. Nani Gasser as PCP Client said she had a recent appointment with PCP Encouraged Mia [name] to talk with PCP about program support Gave client LCSW name and phone number.  Encouraged [name] to call LCSW as needed to discuss program support        SDOH assessments and interventions completed:  Yes  SDOH Interventions Today    Flowsheet Row Most Recent Value  SDOH Interventions   Physical Activity Interventions Other (Comments)  [gait instability]  Stress Interventions Other (Comment)  [may have some stress in managing medical needs]        Care Coordination Interventions:  Yes, provided   Interventions Today    Flowsheet Row Most Recent Value  Chronic Disease   Chronic disease during today's visit Other  [spoke with client about client needs]  General Interventions   General Interventions Discussed/Reviewed General Interventions Discussed, [employer]  [discussed program support]  Exercise Interventions   Exercise Discussed/Reviewed Physical Activity  Education Interventions   Education Provided Provided Education  Provided Verbal Education On [employer]  Mental Health Interventions   Mental Health Discussed/Reviewed Coping Strategies  [no mood issues  noted]  Pharmacy Interventions   Pharmacy Dicussed/Reviewed Pharmacy Topics Discussed        Follow up plan: Follow up call scheduled for 07/12/23 at 2:00 PM     Encounter Outcome:  Pt. Visit Completed   Kelton Pillar.Zaidan Keeble MSW, LCSW Licensed Visual merchandiser [facility] Care Management [phone]

## 2023-05-18 NOTE — Assessment & Plan Note (Signed)
Lasb B12 looks great continue monthly injections.

## 2023-05-18 NOTE — Assessment & Plan Note (Signed)
Blood pressure is little borderline low today but at other times she has not spiked.  In fact recently she said she just was not feeling well and felt a little lightheaded and checked it and it was over 200.  Nothing was different or unusual that day she had not missed any of her medications.  She did take an extra amlodipine.  But was asking about the valsartan.  We did discuss that she is already on the max dose of valsartan as she does take it twice a day so the only 1 that she really has extra wiggle room on would be the amlodipine and it is safe to take an extra 1 if needed.

## 2023-05-18 NOTE — Assessment & Plan Note (Signed)
A long discussion we had a long discussion today about using a walker.  She really continues to fall multiple times in part because of balance issues and lower extremity weakness.  She is likely going to have spine surgery in the next month.  Hopefully will help her pain but I do not suspect that it would probably help with her gait or her weakness.  Hopefully she will get some formal physical therapy afterwards.  But again she does need to be using her walker

## 2023-05-18 NOTE — Patient Instructions (Signed)
Visit Information  Thank you for taking time to visit with me today. Please don't hesitate to contact me if I can be of assistance to you.   Following are the goals we discussed today:   Goals Addressed             This Visit's Progress    Patient Stated she wanted to talk with PCP about program support       Interventions:  Spoke with client via phone today about client needs Discussed program support with client. Described roles of RN, LCSW, Pharmacist in program support Discussed PCP support of client.  Client sees Dr. Nani Gasser as PCP Client said she had a recent appointment with PCP Encouraged Shauna Hugh to talk with PCP about program support Gave client LCSW name and phone number.  Encouraged Safa to call LCSW as needed to discuss program support        Our next appointment is by telephone on 07/12/23 at 2:00 PM   Please call the care guide team at [phone] if you need to cancel or reschedule your appointment.   If you are experiencing a Mental Health or Behavioral Health Crisis or need someone to talk to, please go to [facility] [address], [place] [phone])   The patient verbalized understanding of instructions, educational materials, and care plan provided today and DECLINED offer to receive copy of patient instructions, educational materials, and care plan.   The patient has been provided with contact information for the care management team and has been advised to call with any health related questions or concerns.   Kelton Pillar.Hendrik Donath MSW, LCSW Licensed Visual merchandiser [facility] Care Management [phone]

## 2023-05-18 NOTE — Assessment & Plan Note (Signed)
No sign of volume overload on exams.  No significant edema of either ankle.

## 2023-05-18 NOTE — Assessment & Plan Note (Addendum)
1C today was 5.8 which is significantly elevated from prior last fall.  Just encouraged her to continue to work on healthy food choices and staying active.  Plan to recheck in 6 months.  Lab Results  Component Value Date   HGBA1C 5.8 (A) 05/18/2023

## 2023-05-20 ENCOUNTER — Ambulatory Visit (INDEPENDENT_AMBULATORY_CARE_PROVIDER_SITE_OTHER): Payer: Medicare Other

## 2023-05-20 DIAGNOSIS — Z944 Liver transplant status: Secondary | ICD-10-CM | POA: Diagnosis not present

## 2023-05-20 DIAGNOSIS — K760 Fatty (change of) liver, not elsewhere classified: Secondary | ICD-10-CM | POA: Diagnosis not present

## 2023-05-20 DIAGNOSIS — R932 Abnormal findings on diagnostic imaging of liver and biliary tract: Secondary | ICD-10-CM | POA: Diagnosis not present

## 2023-05-20 DIAGNOSIS — R748 Abnormal levels of other serum enzymes: Secondary | ICD-10-CM | POA: Diagnosis not present

## 2023-05-21 DIAGNOSIS — M5416 Radiculopathy, lumbar region: Secondary | ICD-10-CM | POA: Diagnosis not present

## 2023-05-25 ENCOUNTER — Ambulatory Visit (INDEPENDENT_AMBULATORY_CARE_PROVIDER_SITE_OTHER): Payer: Medicare Other | Admitting: Family Medicine

## 2023-05-25 ENCOUNTER — Institutional Professional Consult (permissible substitution): Payer: Medicare Other | Admitting: Neurology

## 2023-05-25 VITALS — BP 105/50 | HR 59 | Ht 66.0 in | Wt 169.0 lb

## 2023-05-25 DIAGNOSIS — Z0000 Encounter for general adult medical examination without abnormal findings: Secondary | ICD-10-CM | POA: Diagnosis not present

## 2023-05-25 DIAGNOSIS — Z1231 Encounter for screening mammogram for malignant neoplasm of breast: Secondary | ICD-10-CM

## 2023-05-25 DIAGNOSIS — E538 Deficiency of other specified B group vitamins: Secondary | ICD-10-CM | POA: Diagnosis not present

## 2023-05-25 MED ORDER — CYANOCOBALAMIN 1000 MCG/ML IJ SOLN
1000.0000 ug | Freq: Once | INTRAMUSCULAR | Status: AC
Start: 2023-05-25 — End: 2023-05-25
  Administered 2023-05-25: 1000 ug via INTRAMUSCULAR

## 2023-05-25 NOTE — Patient Instructions (Addendum)
MEDICARE ANNUAL WELLNESS VISIT Health Maintenance Summary and Written Plan of Care  [name] ,  Thank you for allowing me to perform your Medicare Annual Wellness Visit and for your ongoing commitment to your health.   Health Maintenance & Immunization History Health Maintenance  Topic Date Due   COVID-19 Vaccine (4 - 2023-24 season) 06/10/2023 (Originally 06/26/2022)   Zoster Vaccines- Shingrix (1 of 2) 08/25/2023 (Originally [birth year]/1970)   INFLUENZA VACCINE  05/27/2023   DEXA SCAN  06/21/2023   Medicare Annual Wellness (AWV)  05/24/2024   MAMMOGRAM  07/02/2024   DTaP/Tdap/Td (3 - Td or Tdap) 05/12/2028   Colonoscopy  03/31/2032   Pneumonia Vaccine 4+ Years old  Completed   Hepatitis C Screening  Completed   HPV VACCINES  Aged Out   Immunization History  Administered Date(s) Administered   Fluad Quad(high Dose 65+) 07/13/2019, 07/10/2020, 07/22/2021, 07/22/2022   Hepatitis B, ADULT 02/25/2021, 03/28/2021, 08/26/2021   Influenza Split 07/26/2012, 06/11/2014, 07/25/2015, 06/23/2018   Influenza Whole 07/30/2009, 08/19/2010   Influenza, High Dose Seasonal PF 07/06/2016, 07/05/2017   Influenza,inj,Quad PF,6+ Mos 06/11/2014, 07/25/2015, 06/23/2018   Influenza-Unspecified 07/26/2012, 07/26/2013, 09/05/2013, 06/11/2014, 07/25/2015, 06/23/2018, 06/27/2019, 07/13/2019, 07/10/2020, 07/22/2021   Moderna Sars-Covid-2 Vaccination 02/29/2020, 03/28/2020, 10/04/2020   Pneumococcal Conjugate-13 01/04/2017   Pneumococcal Polysaccharide-23 07/30/2009, 11/14/2018   Pneumococcal-Unspecified 10/27/2015   Tdap 02/13/2008, 05/12/2018   Zoster, Live 09/01/2011    These are the patient goals that we discussed:  Goals Addressed               This Visit's Progress     Patient Stated (pt-stated)        Patient stated that she would like to loose weight and her work on her balance.          This is a list of Health Maintenance Items that are overdue or due now: Screening mammography Bone  densitometry screening Shingles vaccine - patient will send the dates.   Mammogram ordered Bone density- patient's endocrinologist will order.     Orders/Referrals Placed Today: Orders Placed This Encounter  Procedures   Mammogram 3D SCREEN BREAST BILATERAL    Standing Status:   Future    Standing Expiration Date:   05/24/2024    Scheduling Instructions:     Please call patient to reschedule.    Order Specific Question:   Reason for Exam (SYMPTOM  OR DIAGNOSIS REQUIRED)    Answer:   Breast cancer screening    Order Specific Question:   Preferred imaging location?    Answer:   [facility]    (Contact our referral department at [phone] if you have not spoken with someone about your referral appointment within the next 5 days)    Follow-up Plan Follow-up with Agapito Games, MD as planned Medicare wellness visit in one year.  AVS printed and given to the patient.      Health Maintenance, Female Adopting a healthy lifestyle and getting preventive care are important in promoting health and wellness. Ask your health care provider about: The right schedule for you to have regular tests and exams. Things you can do on your own to prevent diseases and keep yourself healthy. What should I know about diet, weight, and exercise? Eat a healthy diet  Eat a diet that includes plenty of vegetables, fruits, low-fat dairy products, and lean protein. Do not eat a lot of foods that are high in solid fats, added sugars, or sodium. Maintain a healthy weight Body mass index (BMI) is  used to identify weight problems. It estimates body fat based on height and weight. Your health care provider can help determine your BMI and help you achieve or maintain a healthy weight. Get regular exercise Get regular exercise. This is one of the most important things you can do for your health. Most adults should: Exercise for at least 150 minutes each week. The exercise should increase  your heart rate and make you sweat (moderate-intensity exercise). Do strengthening exercises at least twice a week. This is in addition to the moderate-intensity exercise. Spend less time sitting. Even light physical activity can be beneficial. Watch cholesterol and blood lipids Have your blood tested for lipids and cholesterol at 72 years of age, then have this test every 5 years. Have your cholesterol levels checked more often if: Your lipid or cholesterol levels are high. You are older than 72 years of age. You are at high risk for heart disease. What should I know about cancer screening? Depending on your health history and family history, you may need to have cancer screening at various ages. This may include screening for: Breast cancer. Cervical cancer. Colorectal cancer. Skin cancer. Lung cancer. What should I know about heart disease, diabetes, and high blood pressure? Blood pressure and heart disease High blood pressure causes heart disease and increases the risk of stroke. This is more likely to develop in people who have high blood pressure readings or are overweight. Have your blood pressure checked: Every 3-5 years if you are 75-58 years of age. Every year if you are 46 years old or older. Diabetes Have regular diabetes screenings. This checks your fasting blood sugar level. Have the screening done: Once every three years after age 65 if you are at a normal weight and have a low risk for diabetes. More often and at a younger age if you are overweight or have a high risk for diabetes. What should I know about preventing infection? Hepatitis B If you have a higher risk for hepatitis B, you should be screened for this virus. Talk with your health care provider to find out if you are at risk for hepatitis B infection. Hepatitis C Testing is recommended for: Everyone born from [birth year] through 1965. Anyone with known risk factors for hepatitis C. Sexually transmitted infections  (STIs) Get screened for STIs, including gonorrhea and chlamydia, if: You are sexually active and are younger than 72 years of age. You are older than 72 years of age and your health care provider tells you that you are at risk for this type of infection. Your sexual activity has changed since you were last screened, and you are at increased risk for chlamydia or gonorrhea. Ask your health care provider if you are at risk. Ask your health care provider about whether you are at high risk for HIV. Your health care provider may recommend a prescription medicine to help prevent HIV infection. If you choose to take medicine to prevent HIV, you should first get tested for HIV. You should then be tested every 3 months for as long as you are taking the medicine. Pregnancy If you are about to stop having your period (premenopausal) and you may become pregnant, seek counseling before you get pregnant. Take 400 to 800 micrograms (mcg) of folic acid every day if you become pregnant. Ask for birth control (contraception) if you want to prevent pregnancy. Osteoporosis and menopause Osteoporosis is a disease in which the bones lose minerals and strength with aging. This can result in  bone fractures. If you are 58 years old or older, or if you are at risk for osteoporosis and fractures, ask your health care provider if you should: Be screened for bone loss. Take a calcium or vitamin D supplement to lower your risk of fractures. Be given hormone replacement therapy (HRT) to treat symptoms of menopause. Follow these instructions at home: Alcohol use Do not drink alcohol if: Your health care provider tells you not to drink. You are pregnant, may be pregnant, or are planning to become pregnant. If you drink alcohol: Limit how much you have to: 0-1 drink a day. Know how much alcohol is in your drink. In the [place] one drink equals one 12 oz bottle of beer (355 mL), one 5 oz glass of wine (148 mL), or one 1 oz glass  of hard liquor (44 mL). Lifestyle Do not use any products that contain nicotine or tobacco. These products include cigarettes, chewing tobacco, and vaping devices, such as e-cigarettes. If you need help quitting, ask your health care provider. Do not use street drugs. Do not share needles. Ask your health care provider for help if you need support or information about quitting drugs. General instructions Schedule regular health, dental, and eye exams. Stay current with your vaccines. Tell your health care provider if: You often feel depressed. You have ever been abused or do not feel safe at home. Summary Adopting a healthy lifestyle and getting preventive care are important in promoting health and wellness. Follow your health care provider's instructions about healthy diet, exercising, and getting tested or screened for diseases. Follow your health care provider's instructions on monitoring your cholesterol and blood pressure. This information is not intended to replace advice given to you by your health care provider. Make sure you discuss any questions you have with your health care provider. Document Revised: 03/03/2021 Document Reviewed: 03/03/2021 Elsevier Patient Education  2024 [employer].

## 2023-05-25 NOTE — Progress Notes (Signed)
MEDICARE ANNUAL WELLNESS VISIT  05/25/2023  Subjective:  Mia [name] is a 72 y.o. female patient of Metheney, Barbarann Ehlers, MD who had a Medicare Annual Wellness Visit today. Exilda is Retired and lives with their spouse. she has 2 children. she reports that she is socially active and does interact with friends/family regularly. she is moderately physically active and enjoys  to hang out with her friends and play games and do bible study.  Patient Care Team: Agapito Games, MD as PCP - General (Family Medicine) Nahser, Deloris Ping, MD as PCP - Cardiology (Cardiology) Monica Becton, MD as Consulting Physician (Sports Medicine) Pollyann Savoy, MD as Consulting Physician (Rheumatology) Jethro Bolus, MD (Inactive) as Consulting Physician (Urology) Leslye Peer, MD as Consulting Physician (Pulmonary Disease) Sharrell Ku, MD as Consulting Physician (Gastroenterology) Luciana Axe Alford Highland, MD as Consulting Physician (Ophthalmology) Dr. Lesia Sago as Referring Physician (Neurology) Daubert, Leverne Humbles, MD as Consulting Physician (Orthopedic Surgery) Concepcion Elk, MD as Referring Physician (Internal Medicine) Gabriel Carina, [facility] as Pharmacist (Pharmacist) Jill Side, OD as Referring Physician (Optometry) Agapito Games, MD as Consulting Physician (Family Medicine)     05/25/2023    9:22 AM 05/25/2022   10:21 AM 05/05/2022    9:02 AM 01/11/2022    8:15 PM 02/26/2021    2:58 PM 02/22/2020    4:31 PM 11/16/2019    7:35 PM  Advanced Directives  Does Patient Have a Medical Advance Directive? Yes Yes Yes Yes Yes No Yes  Type of Advance Directive Living will Healthcare Power of [place];Living will Living will Living will;Healthcare Power of [employer] Power of [place];Living will  Living will  Does patient want to make changes to medical advance directive? No - Patient declined No - Patient declined No - Patient declined No - Patient declined No - Patient  declined    Copy of Healthcare Power of Attorney in Chart?  No - copy requested   No - copy requested    Would patient like information on creating a medical advance directive?      No - Patient declined No - Patient declined    Hospital Utilization Over the Past 12 Months: # of hospitalizations or ER visits: 2 # of surgeries: 0  Review of Systems    Patient reports that her overall health is slightly worse when compared to last year.  Review of Systems: History obtained from chart review and the patient  All other systems negative.  Pain Assessment Pain : 0-10 Pain Score: 6  Pain Type: Chronic pain Pain Location: Back Pain Radiating Towards: her sarum Pain Descriptors / Indicators: Constant Pain Onset: More than a month ago Pain Frequency: Intermittent Pain Relieving Factors: rest  Pain Relieving Factors: rest  Current Medications & Allergies (verified) Allergies as of 05/25/2023       Reactions   Codeine Nausea And Vomiting   Other Reaction(s): UNKNOWN   Myrbetriq  [mirabegron] Other (See Comments)   SEVERE HEADACHE   Nystatin    Percocet [oxycodone-acetaminophen] Nausea And Vomiting   Propoxyphene Nausea And Vomiting   Other Reaction(s): UNKNOWN   Erythromycin Base    Other Reaction(s): UNKNOWN   Ketorolac    Other Reaction(s): UNKNOWN   Oxycodone-acetaminophen Nausea Only   Celecoxib Other (See Comments)   Other reaction(s): Other flushed Other Reaction(s): UNKNOWN   Darvocet [propoxyphene N-acetaminophen] Nausea And Vomiting   Erythromycin Nausea And Vomiting   Nausea and vomiting   Hydrocodone Nausea And Vomiting   Nitrofurantoin  Nausea And Vomiting   Other Reaction(s): UNKNOWN   Pregabalin Swelling   Legs swelling Other Reaction(s): UNKNOWN   Toradol [ketorolac Tromethamine] Nausea And Vomiting   Per patient, only PO form causes nausea and vomiting. Can take injection without issue   Tramadol Nausea And Vomiting   Verapamil Nausea And Vomiting    REACTION: intolerance Other Reaction(s): UNKNOWN        Medication List        Accurate as of May 25, 2023 11:31 AM. If you have any questions, ask your nurse or doctor.          albuterol 108 (90 Base) MCG/ACT inhaler Commonly known as: VENTOLIN HFA Inhale 2 puffs into the lungs as needed.   amLODipine 5 MG tablet Commonly known as: NORVASC Take 1 tablet (5 mg total) by mouth daily.   B-12 COMPLIANCE INJECTION IJ Inject as directed every 30 (thirty) days.   CLARITIN PO Take by mouth.   donepezil 10 MG tablet Commonly known as: ARICEPT TAKE 1 TABLET IN THE EVENING AT BEDTIME   gabapentin 300 MG capsule Commonly known as: NEURONTIN TAKE 1 TO 2 CAPSULES BY MOUTH UP TO TWO TIMES DAILY AS NEEDED   ibuprofen 600 MG tablet Commonly known as: ADVIL as needed.   ipratropium 0.03 % nasal spray Commonly known as: ATROVENT PLACE 2 SPRAYS INTO BOTH NOSTRILS EVERY 12 (TWELVE) HOURS.   leflunomide 20 MG tablet Commonly known as: ARAVA TAKE 1 TABLET BY MOUTH EVERY DAY   Melatonin 10 MG Caps Take 10 capsules by mouth daily.   memantine 10 MG tablet Commonly known as: NAMENDA TAKE 1 TABLET BY MOUTH TWO TIMES A DAY **MUST SEE DR FOR FURTHER REFILLS**   metoprolol tartrate 100 MG tablet Commonly known as: LOPRESSOR 1 AND 1/2 TABLET BY MOUTH 2 TWICE DAILY What changed:  how much to take how to take this when to take this additional instructions   ondansetron 4 MG tablet Commonly known as: ZOFRAN Take by mouth as needed.   OneTouch Ultra test strip Generic drug: glucose blood ULTRA 2. CHECK BLOOD GLUCOSE 3 TIMES DAILY.   pantoprazole 20 MG tablet Commonly known as: PROTONIX Take 20 mg by mouth 2 (two) times daily.   primidone 50 MG tablet Commonly known as: MYSOLINE Take 1 tablet (50 mg total) by mouth at bedtime.   sodium chloride 5 % ophthalmic solution Commonly known as: MURO 128 Place 1 drop into both eyes every 4 (four) hours as needed for eye  irritation.   valsartan 160 MG tablet Commonly known as: DIOVAN TAKE 1 TABLET BY MOUTH 2 TIMES DAILY.   venlafaxine XR 75 MG 24 hr capsule Commonly known as: EFFEXOR-XR TAKE 1 CAPSULE DAILY WITH BREAKFAST   zoledronic acid 5 MG/100ML Soln injection Commonly known as: RECLAST        History (reviewed): Past Medical History:  Diagnosis Date   Anal fissure    Anemia    Arthritis    Blood transfusion    C. difficile colitis    Chest pain    a. 01/2013 MV: EF 59%, no ischemia.   Chronic Dyspnea    a. 01/2013 Echo: EF 60-65%, Gr 1 DD, PASP 28mmHg.// Echo 01/2020: EF 60-65, no RWMA, GR 1 DD, GLS -21.6%, normal RV SF, trivial MR    COVID-19    DDD (degenerative disc disease), cervical    DDD (degenerative disc disease), lumbar    Diabetes mellitus    Fibromyalgia    Gall stones  GERD (gastroesophageal reflux disease)    gastritis   Hemorrhoids    Hypertension    Interstitial cystitis    MCI (mild cognitive impairment) 11/25/2017   Memory difficulty 12/14/2013   Neuromuscular disorder (HCC)    sclerosis   Osteoporosis    Peptic ulcer    PONV (postoperative nausea and vomiting)    Pseudogout    Raynaud phenomenon    Rectal bleeding    Sleep apnea    a. on cpap.   Stress fracture of left tibia 11/05/2016   SVT (supraventricular tachycardia)    Syncope 09/10/2015   Thyroid disease    hypothyroidism   Tremor, essential 05/14/2016   Past Surgical History:  Procedure Laterality Date   APPENDECTOMY     BLADDER SURGERY     x2   BLADDER SURGERY     BREAST BIOPSY     CARDIAC CATHETERIZATION     CARPAL TUNNEL RELEASE     CATARACT EXTRACTION Bilateral    CHOLECYSTECTOMY     DILATION AND CURETTAGE OF UTERUS     ENTEROCELE REPAIR     x2   EYE SURGERY     retina   herniated disc  11/25/2021   L4 and L5   hysterectomy - unknown type     JOINT REPLACEMENT     KNEE ARTHROPLASTY     KNEE SURGERY     x6   NECK SURGERY     fusion   OOPHORECTOMY     QUADRICEPS  REPAIR Right    RECTOCELE REPAIR     x2   SHOULDER ARTHROSCOPY WITH SUBACROMIAL DECOMPRESSION, ROTATOR CUFF REPAIR AND BICEP TENDON REPAIR  10/06/2012   Procedure: SHOULDER ARTHROSCOPY WITH SUBACROMIAL DECOMPRESSION, ROTATOR CUFF REPAIR AND BICEP TENDON REPAIR;  Surgeon: Mable Paris, MD;  Location: [facility];  Service: Orthopedics;  Laterality: Right;  Arthroscopic  Repair  of  Subscapularis, Open Biceps Tenodesis   SHOULDER SURGERY     bilateral- bones spur   SHOULDER SURGERY Left 04/15/2021   TONSILLECTOMY     TOTAL KNEE ARTHROPLASTY     TOTAL SHOULDER ARTHROPLASTY     Family History  Problem Relation Age of Onset   Heart attack Mother    Stroke Mother    Diabetes Mother    Heart disease Mother    Colon polyps Mother    Asthma Mother    Dementia Mother    Alzheimer's disease Mother    Heart disease Father    Heart attack Father    Asthma Father    Stroke Sister    Hypertension Sister    Rheum arthritis Sister    Dementia Sister    Asthma Sister    Parkinson's disease Sister    Lupus Sister    Asthma Sister    Breast cancer Maternal Grandmother    Wilson's disease Maternal Grandmother    Pancreatic cancer Maternal Grandfather    Autoimmune disease Daughter        in eyes   Arthritis Daughter    Deep vein thrombosis Daughter    Social History   Socioeconomic History   Marital status: Married    Spouse name: Benedetto Goad"   Number of children: 2   Years of education: 14   Highest education level: Associate degree: academic program  Occupational History   Occupation: Retired  Tobacco Use   Smoking status: Never    Passive exposure: Never   Smokeless tobacco: Never  Vaping Use   Vaping status:  Never Used  Substance and Sexual Activity   Alcohol use: No    Alcohol/week: 0.0 standard drinks of alcohol   Drug use: No   Sexual activity: Not on file  Other Topics Concern   Not on file  Social History Narrative   Lives with her  husband. Her grand daughter is living with them.  She likes to hang out with her friends and play games and do bible study.   Social Determinants of Health   Financial Resource Strain: Low Risk  (05/25/2023)   Overall Financial Resource Strain (CARDIA)    Difficulty of Paying Living Expenses: Not hard at all  Food Insecurity: No Food Insecurity (05/25/2023)   Hunger Vital Sign    Worried About Running Out of Food in the Last Year: Never true    Ran Out of Food in the Last Year: Never true  Transportation Needs: No Transportation Needs (05/25/2023)   PRAPARE - Administrator, Civil Service (Medical): No    Lack of Transportation (Non-Medical): No  Physical Activity: Inactive (05/25/2023)   Exercise Vital Sign    Days of Exercise per Week: 0 days    Minutes of Exercise per Session: 0 min  Stress: No Stress Concern Present (05/25/2023)   [employer] of Occupational Health - Occupational Stress Questionnaire    Feeling of Stress : Not at all  Recent Concern: Stress - Stress Concern Present (05/18/2023)   [employer] of Occupational Health - Occupational Stress Questionnaire    Feeling of Stress : To some extent  Social Connections: Socially Integrated (05/25/2023)   Social Connection and Isolation Panel [NHANES]    Frequency of Communication with Friends and Family: More than three times a week    Frequency of Social Gatherings with Friends and Family: More than three times a week    Attends Religious Services: More than 4 times per year    Active Member of [name] or Organizations: Yes    Attends Banker Meetings: More than 4 times per year    Marital Status: Married    Activities of Daily Living    05/25/2023    9:32 AM  In your present state of health, do you have any difficulty performing the following activities:  Hearing? 1  Comment has an ENT appt scheduled  Vision? 0  Difficulty concentrating or making decisions? 0  Walking or climbing stairs?  1  Comment climbing stairs are difficult  Dressing or bathing? 0  Doing errands, shopping? 0  Preparing Food and eating ? N  Using the Toilet? N  In the past six months, have you accidently leaked urine? Y  Comment due to interstitial cysitis  Do you have problems with loss of bowel control? Y  Comment sometimes  Managing your Medications? N  Managing your Finances? N  Housekeeping or managing your Housekeeping? N    Patient Education/Literacy How often do you need to have someone help you when you read instructions, pamphlets, or other written materials from your doctor or pharmacy?: 1 - Never What is the last grade level you completed in school?: some college, PHR  Exercise    Diet Patient reports consuming 2 meals a day and 1-2 snack(s) a day Patient reports that her primary diet is: Regular Patient reports that she does have regular access to food.   Depression Screen    05/25/2023    9:23 AM 05/18/2023   10:54 AM 01/12/2023    1:13 PM 01/05/2023  1:35 PM 11/16/2022   10:39 AM 05/05/2022    9:03 AM 02/26/2021    2:58 PM  PHQ 2/9 Scores  PHQ - 2 Score 0 0 0 0 0 0 0     Fall Risk    05/25/2023    9:23 AM 05/18/2023   10:55 AM 01/12/2023    1:13 PM 01/05/2023    1:34 PM 11/16/2022   10:40 AM  Fall Risk   Falls in the past year? 1 1 1 1 1   Number falls in past yr: 1 1 1 1 1   Injury with Fall? 1 1 0 1 1  Risk for fall due to : History of fall(s);Impaired balance/gait;Impaired mobility History of fall(s) History of fall(s) History of fall(s) History of fall(s)  Follow up Falls evaluation completed;Education provided;Falls prevention discussed Falls evaluation completed Falls evaluation completed Falls evaluation completed Falls evaluation completed     Objective:   BP (!) 105/50 (BP Location: Left Arm, Patient Position: Sitting, Cuff Size: Normal)   Pulse (!) 59   Ht 5\' 6"  (1.676 m)   Wt 169 lb (76.7 kg)   SpO2 94%   BMI 27.28 kg/m   Last Weight  Most recent  update: 05/25/2023  9:17 AM    Weight  76.7 kg (169 lb)             Body mass index is 27.28 kg/m.  Hearing/Vision  Louanna did not have difficulty with hearing/understanding during the face-to-face interview Azucena did not have difficulty with her vision during the face-to-face interview Reports that she has had a formal eye exam by an eye care professional within the past year Reports that she has not had a formal hearing evaluation within the past year  Cognitive Function:    05/25/2023    9:39 AM 05/05/2022    9:09 AM 02/26/2021    3:04 PM  6CIT Screen  What Year? 0 points 0 points 0 points  What month? 0 points 0 points 0 points  What time? 0 points 0 points 0 points  Count back from 20 0 points 0 points 0 points  Months in reverse 0 points 0 points 0 points  Repeat phrase 0 points 2 points 0 points  Total Score 0 points 2 points 0 points    Normal Cognitive Function Screening: Yes (Normal:0-7, Significant for Dysfunction: >8)  Immunization & Health Maintenance Record Immunization History  Administered Date(s) Administered   Fluad Quad(high Dose 65+) 07/13/2019, 07/10/2020, 07/22/2021, 07/22/2022   Hepatitis B, ADULT 02/25/2021, 03/28/2021, 08/26/2021   Influenza Split 07/26/2012, 06/11/2014, 07/25/2015, 06/23/2018   Influenza Whole 07/30/2009, 08/19/2010   Influenza, High Dose Seasonal PF 07/06/2016, 07/05/2017   Influenza,inj,Quad PF,6+ Mos 06/11/2014, 07/25/2015, 06/23/2018   Influenza-Unspecified 07/26/2012, 07/26/2013, 09/05/2013, 06/11/2014, 07/25/2015, 06/23/2018, 06/27/2019, 07/13/2019, 07/10/2020, 07/22/2021   Moderna Sars-Covid-2 Vaccination 02/29/2020, 03/28/2020, 10/04/2020   Pneumococcal Conjugate-13 01/04/2017   Pneumococcal Polysaccharide-23 07/30/2009, 11/14/2018   Pneumococcal-Unspecified 10/27/2015   Tdap 02/13/2008, 05/12/2018   Zoster, Live 09/01/2011    Health Maintenance  Topic Date Due   COVID-19 Vaccine (4 - 2023-24 season) 06/10/2023  (Originally 06/26/2022)   Zoster Vaccines- Shingrix (1 of 2) 08/25/2023 (Originally 08/29/1970)   INFLUENZA VACCINE  05/27/2023   DEXA SCAN  06/21/2023   Medicare Annual Wellness (AWV)  05/24/2024   MAMMOGRAM  07/02/2024   DTaP/Tdap/Td (3 - Td or Tdap) 05/12/2028   Colonoscopy  03/31/2032   Pneumonia Vaccine 39+ Years old  Completed   Hepatitis C Screening  Completed  HPV VACCINES  Aged Out       Assessment  This is a routine wellness examination for Malaika Mellem Bowlds.  Health Maintenance: Due or Overdue There are no preventive care reminders to display for this patient.   [place] does not need a referral for Community Assistance: Care Management:   no Social Work:    no Prescription Assistance:  no Nutrition/Diabetes Education:  no   Plan:  Personalized Goals  Goals Addressed               This Visit's Progress     Patient Stated (pt-stated)        Patient stated that she would like to loose weight and her work on her balance.        Personalized Health Maintenance & Screening Recommendations  Screening mammography Bone densitometry screening Shingles vaccine  - patient will send the dates.   Mammogram ordered Bone density- patient's endocrinologist will order.   Lung Cancer Screening Recommended: no (Low Dose CT Chest recommended if Age 61-80 years, 20 pack-year currently smoking OR have quit w/in past 15 years) Hepatitis C Screening recommended: no HIV Screening recommended: no  Advanced Directives: Written information was not given per the patient's request.  Referrals & Orders Orders Placed This Encounter  Procedures   Mammogram 3D SCREEN BREAST BILATERAL    Follow-up Plan Follow-up with Agapito Games, MD as planned Medicare wellness visit in one year.  AVS printed and given to the patient.   I have personally reviewed and noted the following in the patient's chart:   Medical and social history Use of alcohol, tobacco or illicit drugs   Current medications and supplements Functional ability and status Nutritional status Physical activity Advanced directives List of other physicians Hospitalizations, surgeries, and ER visits in previous 12 months Vitals Screenings to include cognitive, depression, and falls Referrals and appointments  In addition, I have reviewed and discussed with patient certain preventive protocols, quality metrics, and best practice recommendations. A written personalized care plan for preventive services as well as general preventive health recommendations were provided to patient.     Modesto Charon, RN BSN  05/25/2023

## 2023-05-27 ENCOUNTER — Other Ambulatory Visit: Payer: Self-pay | Admitting: Orthopedic Surgery

## 2023-05-27 DIAGNOSIS — J300 Vasomotor rhinitis: Secondary | ICD-10-CM | POA: Diagnosis not present

## 2023-05-27 DIAGNOSIS — D682 Hereditary deficiency of other clotting factors: Secondary | ICD-10-CM | POA: Diagnosis not present

## 2023-05-27 DIAGNOSIS — H903 Sensorineural hearing loss, bilateral: Secondary | ICD-10-CM | POA: Diagnosis not present

## 2023-05-27 DIAGNOSIS — H838X3 Other specified diseases of inner ear, bilateral: Secondary | ICD-10-CM | POA: Diagnosis not present

## 2023-05-27 DIAGNOSIS — H90A32 Mixed conductive and sensorineural hearing loss, unilateral, left ear with restricted hearing on the contralateral side: Secondary | ICD-10-CM | POA: Diagnosis not present

## 2023-05-27 DIAGNOSIS — R42 Dizziness and giddiness: Secondary | ICD-10-CM | POA: Diagnosis not present

## 2023-05-28 NOTE — Progress Notes (Signed)
Hi Mia [name], ultrasound of the liver shows fatty liver.  They did note a few cysts on the right kidney that do not look concerning.  No indication thus far of early cirrhosis.  Treatment for fatty liver is eating a low-fat diet with more vegetables and lean proteins.

## 2023-05-31 ENCOUNTER — Emergency Department (HOSPITAL_COMMUNITY)
Admission: EM | Admit: 2023-05-31 | Discharge: 2023-06-01 | Disposition: A | Discharge status: Home / Self Care | Payer: Medicare Other | Attending: Emergency Medicine | Admitting: Emergency Medicine

## 2023-05-31 ENCOUNTER — Emergency Department (HOSPITAL_COMMUNITY): Payer: Medicare Other

## 2023-05-31 ENCOUNTER — Ambulatory Visit (INDEPENDENT_AMBULATORY_CARE_PROVIDER_SITE_OTHER)
Admission: RE | Admit: 2023-05-31 | Discharge: 2023-05-31 | Disposition: A | Discharge status: Home / Self Care | Payer: Medicare Other | Source: Ambulatory Visit | Attending: Surgery | Admitting: Surgery

## 2023-05-31 ENCOUNTER — Other Ambulatory Visit (HOSPITAL_COMMUNITY): Payer: Self-pay | Admitting: Medical

## 2023-05-31 ENCOUNTER — Encounter (HOSPITAL_COMMUNITY): Payer: Self-pay

## 2023-05-31 ENCOUNTER — Other Ambulatory Visit: Payer: Self-pay

## 2023-05-31 DIAGNOSIS — M25562 Pain in left knee: Secondary | ICD-10-CM

## 2023-05-31 DIAGNOSIS — Z79899 Other long term (current) drug therapy: Secondary | ICD-10-CM | POA: Insufficient documentation

## 2023-05-31 DIAGNOSIS — I700 Atherosclerosis of aorta: Secondary | ICD-10-CM | POA: Diagnosis not present

## 2023-05-31 DIAGNOSIS — Z7901 Long term (current) use of anticoagulants: Secondary | ICD-10-CM | POA: Diagnosis not present

## 2023-05-31 DIAGNOSIS — I82452 Acute embolism and thrombosis of left peroneal vein: Secondary | ICD-10-CM | POA: Diagnosis not present

## 2023-05-31 DIAGNOSIS — I10 Essential (primary) hypertension: Secondary | ICD-10-CM | POA: Diagnosis not present

## 2023-05-31 DIAGNOSIS — R0602 Shortness of breath: Secondary | ICD-10-CM | POA: Diagnosis not present

## 2023-05-31 LAB — CBC WITH DIFFERENTIAL/PLATELET
Abs Immature Granulocytes: 0.03 10*3/uL (ref 0.00–0.07)
Basophils Absolute: 0.1 10*3/uL (ref 0.0–0.1)
Basophils Relative: 1 %
Eosinophils Absolute: 0.2 10*3/uL (ref 0.0–0.5)
Eosinophils Relative: 2 %
HCT: 34.9 % — ABNORMAL LOW (ref 36.0–46.0)
Hemoglobin: 11.1 g/dL — ABNORMAL LOW (ref 12.0–15.0)
Immature Granulocytes: 0 %
Lymphocytes Relative: 21 %
Lymphs Abs: 2 10*3/uL (ref 0.7–4.0)
MCH: 28.7 pg (ref 26.0–34.0)
MCHC: 31.8 g/dL (ref 30.0–36.0)
MCV: 90.2 fL (ref 80.0–100.0)
Monocytes Absolute: 1.2 10*3/uL — ABNORMAL HIGH (ref 0.1–1.0)
Monocytes Relative: 12 %
Neutro Abs: 6.2 10*3/uL (ref 1.7–7.7)
Neutrophils Relative %: 64 %
Platelets: 335 10*3/uL (ref 150–400)
RBC: 3.87 MIL/uL (ref 3.87–5.11)
RDW: 15.2 % (ref 11.5–15.5)
WBC: 9.6 10*3/uL (ref 4.0–10.5)
nRBC: 0 % (ref 0.0–0.2)

## 2023-05-31 LAB — I-STAT CHEM 8, ED
BUN: 7 mg/dL — ABNORMAL LOW (ref 8–23)
Calcium, Ion: 0.96 mmol/L — ABNORMAL LOW (ref 1.15–1.40)
Chloride: 95 mmol/L — ABNORMAL LOW (ref 98–111)
Creatinine, Ser: 0.7 mg/dL (ref 0.44–1.00)
Glucose, Bld: 103 mg/dL — ABNORMAL HIGH (ref 70–99)
HCT: 36 % (ref 36.0–46.0)
Hemoglobin: 12.2 g/dL (ref 12.0–15.0)
Potassium: 4 mmol/L (ref 3.5–5.1)
Sodium: 129 mmol/L — ABNORMAL LOW (ref 135–145)
TCO2: 25 mmol/L (ref 22–32)

## 2023-05-31 LAB — BASIC METABOLIC PANEL
Anion gap: 9 (ref 5–15)
BUN: 9 mg/dL (ref 8–23)
CO2: 24 mmol/L (ref 22–32)
Calcium: 9.1 mg/dL (ref 8.9–10.3)
Chloride: 95 mmol/L — ABNORMAL LOW (ref 98–111)
Creatinine, Ser: 0.75 mg/dL (ref 0.44–1.00)
GFR, Estimated: >60 mL/min (ref >60)
Glucose, Bld: 103 mg/dL — ABNORMAL HIGH (ref 70–99)
Potassium: 4 mmol/L (ref 3.5–5.1)
Sodium: 128 mmol/L — ABNORMAL LOW (ref 135–145)

## 2023-05-31 LAB — TROPONIN I (HIGH SENSITIVITY): Troponin I (High Sensitivity): 4 ng/L (ref <18)

## 2023-05-31 LAB — BRAIN NATRIURETIC PEPTIDE: B Natriuretic Peptide: 48.5 pg/mL (ref 0.0–100.0)

## 2023-05-31 MED ORDER — APIXABAN (ELIQUIS) VTE STARTER PACK (10MG AND 5MG): ORAL_TABLET | ORAL | 0 refills | Status: DC

## 2023-05-31 MED ORDER — APIXABAN 5 MG PO TABS
10.0000 mg | ORAL_TABLET | Freq: Once | ORAL | Status: AC
  Administered 2023-06-01: 10 mg via ORAL
  Filled 2023-05-31: qty 2

## 2023-05-31 MED ORDER — IOHEXOL 350 MG/ML SOLN: 75.0000 mL | Freq: Once | INTRAVENOUS | Status: DC | PRN

## 2023-05-31 MED ORDER — SODIUM CHLORIDE 0.9 % IV BOLUS
500.0000 mL | Freq: Once | INTRAVENOUS | Status: AC
  Administered 2023-05-31: 500 mL via INTRAVENOUS

## 2023-05-31 MED ORDER — FENTANYL CITRATE PF 50 MCG/ML IJ SOSY
50.0000 ug | PREFILLED_SYRINGE | Freq: Once | INTRAMUSCULAR | Status: AC
  Administered 2023-05-31: 50 ug via INTRAVENOUS
  Filled 2023-05-31: qty 1

## 2023-05-31 MED ORDER — IOHEXOL 350 MG/ML SOLN
75.0000 mL | Freq: Once | INTRAVENOUS | Status: AC | PRN
  Administered 2023-05-31: 75 mL via INTRAVENOUS

## 2023-05-31 NOTE — Discharge Instructions (Addendum)
Information on my medicine - ELIQUIS? (apixaban) ? ?This medication education was reviewed with me or my healthcare representative as part of my discharge preparation.  ? ?Why was Eliquis? prescribed for you? ?Eliquis? was prescribed for you to reduce the risk of forming blood clots that can cause a stroke if you have a medical condition called atrial fibrillation (a type of irregular heartbeat) OR to reduce the risk of a blood clots forming after orthopedic surgery. ? ?What do You need to know about Eliquis? ? ?Take your Eliquis? TWICE DAILY - one tablet in the morning and one tablet in the evening with or without food.  It would be best to take the doses about the same time each day. ? ?If you have difficulty swallowing the tablet whole please discuss with your pharmacist how to take the medication safely. ? ?Take Eliquis? exactly as prescribed by your doctor and DO NOT stop taking Eliquis? without talking to the doctor who prescribed the medication.  Stopping may increase your risk of developing a new clot or stroke.  Refill your prescription before you run out. ? ?After discharge, you should have regular check-up appointments with your healthcare provider that is prescribing your Eliquis?.  In the future your dose may need to be changed if your kidney function or weight changes by a significant amount or as you get older. ? ?What do you do if you miss a dose? ?If you miss a dose, take it as soon as you remember on the same day and resume taking twice daily.  Do not take more than one dose of ELIQUIS at the same time. ? ?Important Safety Information ?A possible side effect of Eliquis? is bleeding. You should call your healthcare provider right away if you experience any of the following: ?Bleeding from an injury or your nose that does not stop. ?Unusual colored urine (red or dark brown) or unusual colored stools (red or black). ?Unusual bruising for unknown reasons. ?A serious fall or if you hit your head (even  if there is no bleeding). ? ?Some medicines may interact with Eliquis? and might increase your risk of bleeding or clotting while on Eliquis?. To help avoid this, consult your healthcare provider or pharmacist prior to using any new prescription or non-prescription medications, including herbals, vitamins, non-steroidal anti-inflammatory drugs (NSAIDs) and supplements. ? ?This website has more information on Eliquis? (apixaban): http://www.eliquis.com/eliquis/home ?  ?

## 2023-05-31 NOTE — ED Provider Notes (Signed)
[place] [facility] AT [facility] Provider Note   CSN: [id number] Arrival date & time: 05/31/23  1618     History  Chief Complaint  Patient presents with   Knee Pain    Mia [name] is a 72 y.o. female history of known left meniscal tear here for evaluation of left knee pain.  Patient states she has had chronic pain to her left knee however on Friday her pain changed and started going into the back of her knee into her left calf.  Pain worse with ambulation.  No recent falls or injuries.  No history of PE or DVT.  No recent surgery, immobilization or malignancy.  Was seen by [facility] today.  She was sent for an ultrasound to rule out DVT.  This was positive and she was subsequently sent here.  Patient states she has had some shortness of breath, feels like she cannot take a deep breath.  No fever, cough, PND, orthopnea, abdominal pain.  Has never had anything like this previously.  HPI     Home Medications Prior to Admission medications   Medication Sig Start Date End Date Taking? Authorizing Provider  APIXABAN Everlene Balls) VTE STARTER PACK (10MG  AND 5MG ) Take as directed on package: start with two-5mg  tablets twice daily for 7 days. On day 8, switch to one-5mg  tablet twice daily. 05/31/23  Yes Murice Barbar A, PA-C  acetaminophen (TYLENOL) 500 MG tablet Take 2,000 mg by mouth every 6 (six) hours as needed (pain.).    [provider]  albuterol (VENTOLIN HFA) 108 (90 Base) MCG/ACT inhaler Inhale 1-2 puffs into the lungs every 6 (six) hours as needed for shortness of breath or wheezing. 10/17/21   [provider]  amLODipine (NORVASC) 5 MG tablet Take 1 tablet (5 mg total) by mouth daily. Patient taking differently: Take 5 mg by mouth daily at 6 PM. (1700) 11/12/22   Nahser, Deloris Ping, MD  cetirizine (ZYRTEC) 10 MG tablet Take 10 mg by mouth in the morning.    [provider]  colestipol (COLESTID) 1 g tablet Take 1 g by mouth daily at 12  noon. (1100)    [provider]  cyanocobalamin (VITAMIN B12) 1000 MCG/ML injection Inject 1,000 mcg into the muscle every 28 (twenty-eight) days.    [provider]  donepezil (ARICEPT) 10 MG tablet TAKE 1 TABLET IN THE EVENING AT BEDTIME 02/04/23   Butch Penny, NP  gabapentin (NEURONTIN) 300 MG capsule TAKE 1 TO 2 CAPSULES BY MOUTH UP TO TWO TIMES DAILY AS NEEDED Patient taking differently: Take 300 mg by mouth in the morning and at bedtime. 10/24/19   Agapito Games, MD  ipratropium (ATROVENT) 0.06 % nasal spray Place 2 sprays into both nostrils 2 (two) times daily as needed for rhinitis.    [provider]  leflunomide (ARAVA) 20 MG tablet TAKE 1 TABLET BY MOUTH EVERY DAY 05/02/23   Gearldine Bienenstock, PA-C  Melatonin 10 MG CAPS Take 10 mg by mouth at bedtime.    [provider]  memantine (NAMENDA) 10 MG tablet TAKE 1 TABLET BY MOUTH TWO TIMES A DAY **MUST SEE DR FOR FURTHER REFILLS** 12/23/22   Butch Penny, NP  metoprolol tartrate (LOPRESSOR) 100 MG tablet Take 100 mg by mouth 2 (two) times daily.    [provider]  ondansetron (ZOFRAN-ODT) 8 MG disintegrating tablet Take 8 mg by mouth every 8 (eight) hours as needed for nausea or vomiting.    [provider]  [facility] ULTRA test strip ULTRA 2. CHECK BLOOD GLUCOSE 3 TIMES DAILY. 03/01/23   Agapito Games, MD  pantoprazole (PROTONIX) 20 MG tablet Take 20 mg by mouth daily before breakfast. 10/30/22   [provider]  primidone (MYSOLINE) 50 MG tablet Take 1 tablet (50 mg total) by mouth at bedtime. 05/14/22   Butch Penny, NP  Probiotic Product (ALIGN PO) Take 1 capsule by mouth in the morning.    [provider]  sodium chloride (MURO 128) 5 % ophthalmic solution Place 1 drop into both eyes in the morning, at noon, in the evening, and at bedtime.    [provider]  valsartan (DIOVAN) 160 MG tablet TAKE 1 TABLET BY MOUTH 2 TIMES DAILY. 05/11/23    Nahser, Deloris Ping, MD  venlafaxine XR (EFFEXOR-XR) 75 MG 24 hr capsule TAKE 1 CAPSULE DAILY WITH BREAKFAST 02/04/23   Butch Penny, NP  zoledronic acid (RECLAST) 5 MG/100ML SOLN injection Inject 5 mg into the vein as directed. Received infusion once yearly 01/07/22   [provider]      Allergies    Codeine, Myrbetriq  [mirabegron], Nystatin, Percocet [oxycodone-acetaminophen], Propoxyphene, Erythromycin base, Ketorolac, Oxycodone-acetaminophen, Celecoxib, Darvocet [propoxyphene n-acetaminophen], Erythromycin, Hydrocodone, Nitrofurantoin, Pregabalin, Toradol [ketorolac tromethamine], Tramadol, and Verapamil    Review of Systems   Review of Systems  Constitutional: Negative.   HENT: Negative.    Respiratory:  Positive for shortness of breath. Negative for apnea, cough, choking, chest tightness, wheezing and stridor.   Cardiovascular:  Positive for leg swelling. Negative for chest pain and palpitations.  Gastrointestinal: Negative.   Genitourinary: Negative.   Musculoskeletal:        Left posterior knee and calf pain.  Abnormal gait due to leg pain  Skin: Negative.   Neurological: Negative.   All other systems reviewed and are negative.   Physical Exam Updated Vital Signs BP (!) 152/75   Pulse 68   Temp (!) 97.4 F (36.3 C) (Oral)   Resp 14   SpO2 96%  Physical Exam Vitals and nursing note reviewed.  Constitutional:      General: She is not in acute distress.    Appearance: She is well-developed. She is not ill-appearing, toxic-appearing or diaphoretic.  HENT:     Head: Normocephalic and atraumatic.     Nose: Nose normal.     Mouth/Throat:     Mouth: Mucous membranes are moist.  Eyes:     Pupils: Pupils are equal, round, and reactive to light.  Cardiovascular:     Rate and Rhythm: Normal rate.     Pulses:          Radial pulses are 2+ on the right side and 2+ on the left side.       Dorsalis pedis pulses are 2+ on the right side and 2+ on the left side.      Heart sounds: Normal heart sounds.  Pulmonary:     Effort: Pulmonary effort is normal. No respiratory distress.     Breath sounds: Normal breath sounds.     Comments: Clear Bilaterally, speaks in full sentences Abdominal:     General: Bowel sounds are normal. There is no distension.     Palpations: Abdomen is soft.     Tenderness: There is no abdominal tenderness. There is no right CVA tenderness, left CVA tenderness, guarding or rebound.  Musculoskeletal:        General: Normal range of motion.     Cervical back: Normal range of  motion.     Comments: Has tenderness about left knee, significant pain left popliteal fossa.  Compartments soft.  Tenderness to calf.  Able to flex and extend at knee, foot and ankle.  Skin:    General: Skin is warm and dry.     Capillary Refill: Capillary refill takes less than 2 seconds.     Comments: No erythema, warmth, fluctuance induration  Neurological:     General: No focal deficit present.     Mental Status: She is alert and oriented to person, place, and time. Mental status is at baseline.     Cranial Nerves: No cranial nerve deficit.     Sensory: No sensory deficit.     Motor: No weakness.     Comments: Ambulatory with limp secondary to pain  Psychiatric:        Mood and Affect: Mood normal.    ED Results / Procedures / Treatments   Labs (all labs ordered are listed, but only abnormal results are displayed) Labs Reviewed  CBC WITH DIFFERENTIAL/PLATELET - Abnormal; Notable for the following components:      Result Value   Hemoglobin 11.1 (*)    HCT 34.9 (*)    Monocytes Absolute 1.2 (*)    All other components within normal limits  BASIC METABOLIC PANEL - Abnormal; Notable for the following components:   Sodium 128 (*)    Chloride 95 (*)    Glucose, Bld 103 (*)    All other components within normal limits  I-STAT CHEM 8, ED - Abnormal; Notable for the following components:   Sodium 129 (*)    Chloride 95 (*)    BUN 7 (*)    Glucose,  Bld 103 (*)    Calcium, Ion 0.96 (*)    All other components within normal limits  BRAIN NATRIURETIC PEPTIDE  TROPONIN I (HIGH SENSITIVITY)    EKG None  Radiology CT Angio Chest PE W and/or Wo Contrast  Result Date: 05/31/2023 CLINICAL DATA:  Pulmonary embolism (PE) suspected, high prob sob, known DVT. Shortness of breath. DVT. EXAM: CT ANGIOGRAPHY CHEST WITH CONTRAST TECHNIQUE: Multidetector CT imaging of the chest was performed using the standard protocol during bolus administration of intravenous contrast. Multiplanar CT image reconstructions and MIPs were obtained to evaluate the vascular anatomy. RADIATION DOSE REDUCTION: This exam was performed according to the departmental dose-optimization program which includes automated exposure control, adjustment of the mA and/or kV according to patient size and/or use of iterative reconstruction technique. CONTRAST:  75mL OMNIPAQUE IOHEXOL 350 MG/ML SOLN COMPARISON:  02/22/2020 FINDINGS: Cardiovascular: Heart is normal size. Aorta is normal caliber. No filling defects in the pulmonary arteries to suggest pulmonary emboli. Scattered aortic calcifications. Mediastinum/Nodes: No mediastinal, hilar, or axillary adenopathy. Trachea and esophagus are unremarkable. Thyroid unremarkable. Lungs/Pleura: No confluent airspace opacities, effusions or pulmonary nodules. Upper Abdomen: No acute findings.  Pneumobilia. Musculoskeletal: Chest wall soft tissues are unremarkable. No acute bony abnormality. Review of the MIP images confirms the above findings. IMPRESSION: No evidence of pulmonary embolus. No acute cardiopulmonary disease. Aortic Atherosclerosis (ICD10-I70.0). Electronically Signed   By: Charlett Nose M.D.   On: 05/31/2023 21:05   DG Chest 2 View  Result Date: 05/31/2023 CLINICAL DATA:  Shortness of breath EXAM: CHEST - 2 VIEW COMPARISON:  09/19/2022 FINDINGS: The heart size and mediastinal contours are within normal limits. Both lungs are clear. The  visualized skeletal structures are unremarkable. IMPRESSION: No active cardiopulmonary disease. Electronically Signed   By: Charlett Nose M.D.  On: 05/31/2023 21:02   VAS [place] LOWER EXTREMITY VENOUS (DVT)  Result Date: 05/31/2023  Lower Venous DVT Study Patient Name:  YALIXA GIUFFRE  Date of Exam:   05/31/2023 Medical Rec #: [id number]     Accession #:    [id number] Date of Birth: Feb 16, 1951     Patient Gender: F Patient Age:   31 years Exam Location:  [facility] Procedure:      VAS [place] LOWER EXTREMITY VENOUS (DVT) Referring Phys: Harlene Salts --------------------------------------------------------------------------------  Indications: Left lower extremity pain, shortness of breath, left knee injury.  Limitations: Poor ultrasound/tissue interface. Comparison Study: No prior study Performing Technologist: Gertie Fey MHA, RDMS, RVT, RDCS  Examination Guidelines: A complete evaluation includes B-mode imaging, spectral Doppler, color Doppler, and power Doppler as needed of all accessible portions of each vessel. Bilateral testing is considered an integral part of a complete examination. Limited examinations for reoccurring indications may be performed as noted. The reflux portion of the exam is performed with the patient in reverse Trendelenburg.  +-----+---------------+---------+-----------+----------+--------------+ RIGHTCompressibilityPhasicitySpontaneityPropertiesThrombus Aging +-----+---------------+---------+-----------+----------+--------------+ CFV  Full           Yes      Yes                                 +-----+---------------+---------+-----------+----------+--------------+   +---------+---------------+---------+-----------+----------+--------------+ LEFT     CompressibilityPhasicitySpontaneityPropertiesThrombus Aging +---------+---------------+---------+-----------+----------+--------------+ CFV      Full           Yes      Yes                                  +---------+---------------+---------+-----------+----------+--------------+ SFJ      Full                                                        +---------+---------------+---------+-----------+----------+--------------+ FV Prox  Full                                                        +---------+---------------+---------+-----------+----------+--------------+ FV Mid   Full                                                        +---------+---------------+---------+-----------+----------+--------------+ FV DistalFull                                                        +---------+---------------+---------+-----------+----------+--------------+ PFV      Full                                                        +---------+---------------+---------+-----------+----------+--------------+  POP      Full           Yes      Yes                                 +---------+---------------+---------+-----------+----------+--------------+ PTV      Full                                                        +---------+---------------+---------+-----------+----------+--------------+ PERO     None                    No                   Acute          +---------+---------------+---------+-----------+----------+--------------+   Summary: RIGHT: - No evidence of common femoral vein obstruction.  LEFT: - Findings consistent with acute deep vein thrombosis involving the left peroneal veins. - No cystic structure found in the popliteal fossa.  *See table(s) above for measurements and observations.    Preliminary     Procedures Procedures    Medications Ordered in ED Medications  iohexol (OMNIPAQUE) 350 MG/ML injection 75 mL (has no administration in time range)  apixaban (ELIQUIS) tablet 10 mg (has no administration in time range)  sodium chloride 0.9 % bolus 500 mL (500 mLs Intravenous New Bag/Given 05/31/23 2021)  fentaNYL (SUBLIMAZE) injection 50 mcg (50 mcg  Intravenous Given 05/31/23 2021)  iohexol (OMNIPAQUE) 350 MG/ML injection 75 mL (75 mLs Intravenous Contrast Given 05/31/23 2059)    ED Course/ Medical Decision Making/ A&P   72 year old here for evaluation of positive DVT ultrasound.  Has known chronic pain to the left knee from known torn meniscus.  Pain changed on Friday.  Start extending down her posterior calf.  She also noted some shortness of breath at that time as well as sensation of inability to take a deep breath.  No overt chest pain.  No PND, orthopnea.  No prior history of PE, DVT, recent surgery, immobilization or malignancy.  Some diffuse tenderness about her left posterior calf, compartments soft.  Neurovascularly intact.  No evidence of ischemia, cerulea dolens.  No erythema, warmth to suggest septic joint, gout.  She had ultrasound performed outpatient that showed left peroneal vein.  Her heart and lungs are clear.  Her abdomen is soft, nontender.  Will plan on labs, CTA of her chest to rule out PE as cause of her shortness of breath.  Labs and imaging personally viewed and interpreted:  Ultrasound DVT left peroneal vein CBC no leukocytosis, hemoglobin 11.1 BMP sodium 128 small bolus IV fluids Troponin 4 BNP 48.5 EKG no STEMI Chest x-ray no cardiomegaly, pulm edema, pneumothorax CTA no PE, edema, infection  Patient reassessed.  Ambulatory without any hypoxia.  We discussed her labs and imaging.  Will be started on anticoagulation for her DVT.  She is no evidence of compartment syndrome, ischemia central large vessel VTE on her imaging.  First dose anticoagulation given here in the emergency department.  She has no contraindications to anticoagulation.  We discussed close follow-up with PCP, return for new or worsening symptoms.  We also discussed risk of anticoagulation which would require her to return to the emergency department  The patient has been appropriately medically screened and/or stabilized in the ED. I have low  suspicion for any other emergent medical condition which would require further screening, evaluation or treatment in the ED or require inpatient management.  Patient is hemodynamically stable and in no acute distress.  Patient able to ambulate in department prior to ED.  Evaluation does not show acute pathology that would require ongoing or additional emergent interventions while in the emergency department or further inpatient treatment.  I have discussed the diagnosis with the patient and answered all questions.  Pain is been managed while in the emergency department and patient has no further complaints prior to discharge.  Patient is comfortable with plan discussed in room and is stable for discharge at this time.  I have discussed strict return precautions for returning to the emergency department.  Patient was encouraged to follow-up with PCP/specialist refer to at discharge.                                 Medical Decision Making Amount and/or Complexity of Data Reviewed Independent Historian: spouse External Data Reviewed: labs, radiology, ECG and notes. Labs: ordered. Decision-making details documented in ED Course. Radiology: ordered and independent interpretation performed. Decision-making details documented in ED Course. ECG/medicine tests: ordered and independent interpretation performed. Decision-making details documented in ED Course.  Risk OTC drugs. Prescription drug management. Parenteral controlled substances. Decision regarding hospitalization. Diagnosis or treatment significantly limited by social determinants of health.          Final Clinical Impression(s) / ED Diagnoses Final diagnoses:  Acute deep vein thrombosis (DVT) of left peroneal vein (HCC)    Rx / DC Orders ED Discharge Orders          Ordered    APIXABAN (ELIQUIS) VTE STARTER PACK (10MG  AND 5MG )       Note to Pharmacy: If starter pack unavailable, substitute with seventy-four 5 mg apixaban tabs  following the above SIG directions.   05/31/23 2157              Shahrzad Koble A, PA-C 05/31/23 2241    Elayne Snare K, DO 05/31/23 2356

## 2023-05-31 NOTE — ED Triage Notes (Signed)
Pt to ED with for further evaluation of DVT to left calf. Had [place] completed this afternoon that showed clot. Reports pain and difficulty ambulatory x3 days.

## 2023-05-31 NOTE — Progress Notes (Signed)
Left lower extremity venous duplex completed. Critical results, as well as patient's current shortness of breath, has been discussed with Harlene Salts, PA-C. Patient has been instructed to go to [facility] Emergency Department. Patient expresses understanding.   05/31/2023 4:01 PM  Gertie Fey, MHA, RVT, RDCS, RDMS

## 2023-06-01 ENCOUNTER — Telehealth: Payer: Self-pay | Admitting: Family Medicine

## 2023-06-01 DIAGNOSIS — I82452 Acute embolism and thrombosis of left peroneal vein: Secondary | ICD-10-CM | POA: Diagnosis not present

## 2023-06-01 NOTE — Telephone Encounter (Signed)
Patient seen at [facility] sent her to the [facility] they dx her with a HVT in left calf she has some questions and would like a call back please [phone]

## 2023-06-02 ENCOUNTER — Telehealth: Payer: Self-pay | Admitting: Family Medicine

## 2023-06-02 ENCOUNTER — Ambulatory Visit: Admit: 2023-06-02 | Payer: MEDICARE | Attending: Physician Assistant | Primary: Internal Medicine

## 2023-06-02 DIAGNOSIS — R319 Hematuria, unspecified: Secondary | ICD-10-CM

## 2023-06-02 LAB — AMB POC URINALYSIS DIP STICK AUTO W/O MICRO
Bilirubin, Urine, POC: NEGATIVE
Glucose, Urine, POC: NEGATIVE
Nitrite, Urine, POC: NEGATIVE
Protein, Urine, POC: NEGATIVE
Specific Gravity, Urine, POC: 1.02 (ref 1.001–1.035)
Urobilinogen, POC: 0.2
pH, Urine, POC: 6 (ref 4.6–8.0)

## 2023-06-02 NOTE — Telephone Encounter (Signed)
Pt has f/u tomorrow

## 2023-06-02 NOTE — Progress Notes (Unsigned)
Dr. Nathaneil Canary is the supervising physician today.            Laura Roman  07-27-1951        {No diagnosis found. (Refresh or delete this SmartLink)}    Assessment:  UA today trace blood, trace leuks    1. ?Gross Hematuria   Negative hematuria work-up in 2017- found to have endometrial cancer later   CT w/ cont 02/06/23- Kidneys normal, Bladder is poorly distended, limiting evaluation. Minimal thickening of the anterior wall is likely accentuated by poor distention. Consider correlation with urinalysis to exclude cystitis.      2. Breast Cancer   Radiation and chemo    3. Endometrial cancer   Hx of hysterectomy 2018      Patient defers cysto, notes she had a lot of pain with previous cysto.     Plan:   Reviewed CT from 01/2023  Send urine for cytology        There is no height or weight on file to calculate BMI.     Discussion        No chief complaint on file.        History of Present Illness:  Laura Roman is a 72 y.o. female who presents today in consultation for gross hematuria and has been referred by Bennie Hind, MD.     Patient notes blood after hard bowel movement in April, unsure if it was from [facility] or bladder. She notes blood in the toilet the following day w/o BM. Saw gynecologist who stated this is not coming vaginally. She was told from GI that she has an anal fissure so she cannot have a colonoscopy yet.     No family history of  bladder or kidney cancer  Former smoker: quit 50 years ago, off in on in 20s, social smoking  Radiation and chemo for breast cancer      Past Medical History:   Diagnosis Date    Allergic rhinitis     Anxiety     Breast cancer (HCC) 01/2020    Dr Brock Ra; LEFT T2N0M0 invasive adenoca  ER/PR/Her2 neg Dr Fidela Juneau neoadjuvant AC-Taxol; lumpectomy and Ln dissection 10/21    Compression fx, thoracic spine (HCC) 2007    negative DEXA Dr. Parks Ranger    DM (diabetes mellitus) (HCC) 10/2017    on basis of fbs>125; intol metformin    Dyslipidemia     Endometrial cancer (HCC) 02/2017    Dr  Isac Sarna; stage 1B gr 1 endometroid adenoca w foci squamous diff of the endometrium; RA TLH BSO PLND, 0/13LN, MSI intact    Frozen shoulder     right Dr. 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 Paulina Fusi, Dr. Parks Ranger    Osteopenia     DEXA -0.5 spine, -0.8 hip (1/18); 0.5 spine, -1.3 hip (3/22); -1.1 wrist, -1.1 hip (4/23)    Overweight (BMI 25.0-29.9)     IF 7/18 start weight 168 lbs not doing  Painless hematuria 04/2016    Dr Mayford Knife, neg eval    Palpitations 2008    neg thallium 2008, nl holter 2008, echo nl lv/ef 65%/tr mr/dd/nl pasp    Syncope     neurocardiogenic by tilt 1994    TMJ syndrome 2023    LEFT after wisdon tooth surgery    Venous insufficiency     Vitamin D deficiency        Past Surgical History:   Procedure Laterality Date    BREAST LUMPECTOMY  07/2020    and LN dissection Dr Renato Gails    COLONOSCOPY      Dr Derrell Lolling (2007) neg; (04/22/18) neg    HEMORRHOID SURGERY      Dr. Derrell Lolling 2007    HYSTERECTOMY (CERVIX STATUS UNKNOWN)  03/11/2017    Dr Isac Sarna    TUBAL LIGATION      UROLOGICAL SURGERY  06/2016    Dr Mayford Knife; bladder bx showed benign lesions    US BREAST BIOPSY W LOC DEVICE 1ST LESION LEFT Left 02/21/2020    US BREAST NEEDLE BIOPSY LEFT 02/21/2020 [facility] [place]         Current Outpatient Medications:     Multiple Vitamins-Minerals (MULTIVITAMIN ADULTS 50+ PO), Take 1 tablet by mouth daily, Disp: , Rfl:     lansoprazole (PREVACID) 30 MG delayed release capsule, take 1 capsule by mouth BEFORE BREAKFAST DAILY, Disp: 90 capsule, Rfl: 3    atorvastatin (LIPITOR) 40 MG tablet, take 1 tablet by mouth once daily, Disp: 90 tablet, Rfl: 3     gabapentin (NEURONTIN) 100 MG capsule, Take 3 capsules by mouth in the morning and at bedtime for 180 days. Intended supply: 30 days Max Daily Amount: 600 mg (Patient taking differently: Take 3 capsules by mouth nightly. Intended supply: 30 days), Disp: 180 capsule, Rfl: 5    JANUVIA 100 MG tablet, take 1 tablet by mouth once daily, Disp: 90 tablet, Rfl: 3    citalopram (CELEXA) 10 MG tablet, take 1 tablet by mouth once daily, Disp: 90 tablet, Rfl: 3    Cholecalciferol (VITAMIN D3) 1.25 MG (50000 UT) CAPS, Take 1 capsule by mouth once a week, Disp: , Rfl:     Lancets MISC, Use daily as directed, Disp: , Rfl:     aspirin 81 MG chewable tablet, Take 1 tablet by mouth daily, Disp: , Rfl:     Allergies   Allergen Reactions    Fish Oil Nausea Only     Patient denies  Other reaction(s): gi distress  Patient denies    Lidocaine Other (See Comments)     anxious    Metformin Diarrhea    Nabumetone Nausea Only    Thimerosal (Thiomersal) Hives       Social History     Socioeconomic History    Marital status: Married     Spouse name: Not on file    Number of children: 2    Years of education: Not on file    Highest education level: Not on file   Occupational History    Occupation: ret benefits specialist   Tobacco Use    Smoking status: Never     Passive exposure: Never    Smokeless tobacco: Never   Vaping Use    Vaping Use: Never used   Substance and Sexual Activity    Alcohol use: Yes     Alcohol/week: 4.0 standard drinks of alcohol    Drug use: No    Sexual activity: Not Currently  Partners: Male   Other Topics Concern    Not on file   Social History Narrative    Not on file     Social Determinants of Health     Financial Resource Strain: Low Risk  (07/16/2022)    Overall Financial Resource Strain (CARDIA)     Difficulty of Paying Living Expenses: Not hard at all   Food Insecurity: Not on file (07/16/2022)   Transportation Needs: Unknown (07/16/2022)    PRAPARE - Therapist, art (Medical): Not on  file     Lack of Transportation (Non-Medical): No   Physical Activity: Inactive (01/12/2023)    Exercise Vital Sign     Days of Exercise per Week: 0 days     Minutes of Exercise per Session: 0 min   Stress: Not on file   Social Connections: Not on file   Intimate Partner Violence: Not on file   Housing Stability: Unknown (07/16/2022)    Housing Stability Vital Sign     Unable to Pay for Housing in the Last Year: Not on file     Number of Places Lived in the Last Year: Not on file     Unstable Housing in the Last Year: No       Family History   Problem Relation Age of Onset    Heart Disease Paternal Grandfather     Stroke Paternal Grandmother     Diabetes Mother     Elevated Lipids Father            PHYSICAL EXAMINATION:   LMP  (LMP Unknown)   Constitutional: WDWN, Pleasant and appropriate affect, No acute distress.    CV:  No peripheral swelling noted  Respiratory: No respiratory distress or difficulties  Abdomen: No abdominal distention. No CVA tenderness ***   Skin: No jaundice.    Neuro/Psych:  Alert and oriented x 3. Affect appropriate.   Lymphatic:   No enlarged supraclavicular lymph nodes.       Labs & Imaging:   No results found for this visit on 06/02/23.      A copy of today's office visit with all pertinent imaging results and labs were sent to the referring physician,Dumaran, Murvin Donning, MD    London Sheer, PA-C  [facility]   [address] [place], [address]  [place], [place] [address]  P: [phone]   F: [phone]

## 2023-06-02 NOTE — Progress Notes (Incomplete)
Dr. Nathaneil Canary is the supervising physician today.            Laura Roman  06/01/1951         Diagnosis Orders   1. Hematuria, unspecified type  AMB POC URINALYSIS DIP STICK AUTO W/O MICRO          Assessment:  UA today trace blood, trace leuks    1. ?Gross Hematuria   Negative hematuria work-up in 2017- found to have endometrial cancer later   CT w/ cont 02/06/23- Kidneys normal, Bladder is poorly distended, limiting evaluation. Minimal thickening of the anterior wall is likely accentuated by poor distention. Consider correlation with urinalysis to exclude cystitis.      2. Breast Cancer   Radiation and chemo    3. Endometrial cancer   Hx of hysterectomy 2018      Patient unsure if blood coming from rectum, vaginally, or from bladder. Patient defers cysto, notes she had a lot of pain with previous cysto.     Plan:   Reviewed CT from 01/2023  Send urine for cytology if + recommend CTU and cysto  RTC pending cytology        Body mass index is 26.61 kg/m.     Discussion        Chief Complaint   Patient presents with   . Hematuria         History of Present Illness:  Laura Roman is a 72 y.o. female who presents today in consultation for gross hematuria and has been referred by Bennie Hind, MD.     Patient notes blood after hard bowel movement in April, unsure if it was from [facility] or bladder. She notes blood in the toilet the following day w/o BM. Saw gynecologist who stated this is not coming vaginally. She was told from GI that she has an anal fissure so she cannot have a colonoscopy yet.     No family history of  bladder or kidney cancer  Former smoker: quit 50 years ago, off in on in 20s, social smoking  Radiation and chemo for breast cancer      Past Medical History:   Diagnosis Date   . Allergic rhinitis    . Anxiety    . Breast cancer (HCC) 01/2020    Dr Brock Ra; LEFT T2N0M0 invasive adenoca  ER/PR/Her2 neg Dr Fidela Juneau neoadjuvant AC-Taxol; lumpectomy and Ln dissection 10/21   . Compression fx, thoracic spine (HCC)  2007    negative DEXA Dr. Parks Ranger   . DM (diabetes mellitus) (HCC) 10/2017    on basis of fbs>125; intol metformin   . Dyslipidemia    . Endometrial cancer (HCC) 02/2017    Dr Isac Sarna; stage 1B gr 1 endometroid adenoca w foci squamous diff of the endometrium; RA TLH BSO PLND, 0/13LN, MSI intact   . Frozen shoulder     right Dr. Jackqulyn Livings MRI   . H/O cardiovascular stress test     thallium (2005) neg; NST neg ef 75% (1/17)   . H/O echocardiogram     EF 70% (12/04);  ef 60%, mild MR (7/21) [facility]   . Left thyroid nodule 04/2016    1cm nodule; apparently noted on CT 2008 and unchanged   . Multiple lung nodules     no change 10/06, 03/07, 03/08   . 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 Osteoarthritis     Dr. Paulina Fusi, Dr. Parks Ranger   . Osteopenia  DEXA -0.5 spine, -0.8 hip (1/18); 0.5 spine, -1.3 hip (3/22); -1.1 wrist, -1.1 hip (4/23)   . Overweight (BMI 25.0-29.9)     IF 7/18 start weight 168 lbs not doing    . Painless hematuria 04/2016    Dr Mayford Knife, neg eval   . Palpitations 2008    neg thallium 2008, nl holter 2008, echo nl lv/ef 65%/tr mr/dd/nl pasp   . Syncope     neurocardiogenic by tilt 1994   . TMJ syndrome 2023    LEFT after wisdon tooth surgery   . Venous insufficiency    . Vitamin D deficiency        Past Surgical History:   Procedure Laterality Date   . BREAST LUMPECTOMY  07/2020    and LN dissection Dr Renato Gails   . COLONOSCOPY      Dr Derrell Lolling (2007) neg; (04/22/18) neg   . HEMORRHOID SURGERY      Dr. Derrell Lolling 2007   . HYSTERECTOMY (CERVIX STATUS UNKNOWN)  03/11/2017    Dr Isac Sarna   . TUBAL LIGATION     . UROLOGICAL SURGERY  06/2016    Dr Mayford Knife; bladder bx showed benign lesions   . US BREAST BIOPSY W LOC DEVICE 1ST LESION LEFT Left 02/21/2020    US BREAST NEEDLE BIOPSY LEFT 02/21/2020 [facility] [place]         Current Outpatient Medications:   Marland Kitchen  Multiple Vitamins-Minerals (MULTIVITAMIN ADULTS 50+ PO), Take 1 tablet by mouth daily, Disp: , Rfl:   .  lansoprazole (PREVACID) 30 MG  delayed release capsule, take 1 capsule by mouth BEFORE BREAKFAST DAILY, Disp: 90 capsule, Rfl: 3  .  atorvastatin (LIPITOR) 40 MG tablet, take 1 tablet by mouth once daily, Disp: 90 tablet, Rfl: 3  .  JANUVIA 100 MG tablet, take 1 tablet by mouth once daily, Disp: 90 tablet, Rfl: 3  .  citalopram (CELEXA) 10 MG tablet, take 1 tablet by mouth once daily, Disp: 90 tablet, Rfl: 3  .  Cholecalciferol (VITAMIN D3) 1.25 MG (50000 UT) CAPS, Take 1 capsule by mouth once a week, Disp: , Rfl:   .  Lancets MISC, Use daily as directed, Disp: , Rfl:   .  aspirin 81 MG chewable tablet, Take 1 tablet by mouth daily, Disp: , Rfl:   .  gabapentin (NEURONTIN) 100 MG capsule, Take 3 capsules by mouth in the morning and at bedtime for 180 days. Intended supply: 30 days Max Daily Amount: 600 mg (Patient taking differently: Take 3 capsules by mouth nightly. Intended supply: 30 days), Disp: 180 capsule, Rfl: 5    Allergies   Allergen Reactions   . Fish Oil Nausea Only     Patient denies  Other reaction(s): gi distress  Patient denies   . Lidocaine Other (See Comments)     anxious   . Metformin Diarrhea   . Nabumetone Nausea Only   . Thimerosal (Thiomersal) Hives       Social History     Socioeconomic History   . Marital status: Married     Spouse name: Not on file   . Number of children: 2   . Years of education: Not on file   . Highest education level: Not on file   Occupational History   . 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 Smoking status: Never     Passive exposure: Never   . Smokeless tobacco: Never   Vaping Use   . Vaping Use:  Never used   Substance and Sexual Activity   . Alcohol use: Yes     Alcohol/week: 4.0 standard drinks of alcohol   . Drug use: No   . Sexual activity: Not Currently     Partners: Male   Other Topics Concern   . Not on file   Social History Narrative   . Not on file     Social Determinants of Health     Financial Resource Strain: Low Risk  (07/16/2022)    Overall Financial Resource Strain  (CARDIA)    . Difficulty of Paying Living Expenses: Not hard at all   Food Insecurity: Not on file (07/16/2022)   Transportation Needs: Unknown (07/16/2022)    PRAPARE - Transportation    . Lack of Transportation (Medical): Not on file    . Lack of Transportation (Non-Medical): No   Physical Activity: Inactive (01/12/2023)    Exercise Vital Sign    . Days of Exercise per Week: 0 days    . Minutes of Exercise per Session: 0 min   Stress: Not on file   Social Connections: Not on file   Intimate Partner Violence: Not on file   Housing Stability: Unknown (07/16/2022)    Housing Stability Vital Sign    . Unable to Pay for Housing in the Last Year: Not on file    . Number of Places Lived in the Last Year: Not on file    . Unstable Housing in the Last Year: No       Family History   Problem Relation Age of Onset   . Heart Disease Paternal Grandfather    . Stroke Paternal Grandmother    . Diabetes Mother    . Elevated Lipids Father            PHYSICAL EXAMINATION:   Ht 1.626 m (5\' 4" )   Wt 70.3 kg (155 lb)   LMP  (LMP Unknown)   BMI 26.61 kg/m   Constitutional: WDWN, Pleasant and appropriate affect, No acute distress.    CV:  No peripheral swelling noted  Respiratory: No respiratory distress or difficulties  Abdomen: No abdominal distention. No CVA tenderness ***   Skin: No jaundice.    Neuro/Psych:  Alert and oriented x 3. Affect appropriate.   Lymphatic:   No enlarged supraclavicular lymph nodes.       Labs & Imaging:   No results found for this visit on 06/02/23.      A copy of today's office visit with all pertinent imaging results and labs were sent to the referring physician,Dumaran, Murvin Donning, MD    London Sheer, PA-C  [facility]   [address] [place], [address]  [place], [place] [address]  P: [phone]   F: [phone]

## 2023-06-03 ENCOUNTER — Encounter: Payer: Self-pay | Admitting: Family Medicine

## 2023-06-03 ENCOUNTER — Ambulatory Visit (INDEPENDENT_AMBULATORY_CARE_PROVIDER_SITE_OTHER): Payer: Medicare Other | Admitting: Family Medicine

## 2023-06-03 VITALS — BP 104/54 | HR 69 | Ht 66.0 in | Wt 169.0 lb

## 2023-06-03 DIAGNOSIS — I82452 Acute embolism and thrombosis of left peroneal vein: Secondary | ICD-10-CM | POA: Diagnosis not present

## 2023-06-03 MED ORDER — GABAPENTIN 300 MG PO CAPS: 300.0000 mg | ORAL_CAPSULE | Freq: Two times a day (BID) | ORAL | 1 refills | Status: DC

## 2023-06-03 NOTE — Progress Notes (Signed)
Established Patient Office Visit  Subjective   Patient ID: Mia [name], female    DOB: 1951-06-26  Age: 72 y.o. MRN: [id number]  Chief Complaint  Patient presents with   Hospitalization Follow-up    DVT      HPI  She is here today for follow-up from emergency room visit on August 5..  She has some chronic left knee pain but the pain changed in nature and actually started radiating to the back of her knee on August 2 and so she decided to go to the emergency room. Had LE dopplers and revealed DVT of the left peroneal vein.  CT angio was negative for pulmonary embolus.  He reports that she still having significant pain behind the left knee and going into her upper half Of her calf muscle.  No known triggers for the DVT.  No recent prolonged sitting or travel, not on any type of hormone therapy.  She is a non-smoker.  She was supposed to have back surgery at the end of the month and so has had to delay that.    Lower Venous DVT Study  Patient Name:  Mia [name]  Date of Exam:   05/31/2023 Medical Rec #: [id number]     Accession #:    [id number] Date of Birth: [birth year]     Patient Gender: F Patient Age:   72 years Exam Location:  [facility] Procedure:      VAS [place] LOWER EXTREMITY VENOUS (DVT) Referring Phys: Apolinar Junes MORELLI   --------------------------------------------------------------------------- -----   Indications: Left lower extremity pain, shortness of breath, left knee injury.   Limitations: Poor ultrasound/tissue interface. Comparison Study: No prior study  Performing Technologist: Gertie Fey MHA, RDMS, RVT, RDCS    Examination Guidelines: A complete evaluation includes B-mode imaging, spectral Doppler, color Doppler, and power Doppler as needed of all accessible portions of each vessel. Bilateral testing is considered an integral part of a complete examination. Limited examinations for reoccurring indications may be performed as noted.  The reflux portion of the exam is performed with the patient in reverse Trendelenburg.     +-----+---------------+---------+-----------+----------+--------------+ RIGHTCompressibilityPhasicitySpontaneityPropertiesThrombus Aging +-----+---------------+---------+-----------+----------+--------------+ CFV  Full           Yes      Yes                                 +-----+---------------+---------+-----------+----------+--------------+        +---------+---------------+---------+-----------+----------+--------------+  LEFT     CompressibilityPhasicitySpontaneityPropertiesThrombus Aging +---------+---------------+---------+-----------+----------+--------------+  CFV      Full           Yes      Yes                                  +---------+---------------+---------+-----------+----------+--------------+  SFJ      Full                                                         +---------+---------------+---------+-----------+----------+--------------+  FV Prox  Full                                                         +---------+---------------+---------+-----------+----------+--------------+  FV Mid   Full                                                         +---------+---------------+---------+-----------+----------+--------------+  FV DistalFull                                                         +---------+---------------+---------+-----------+----------+--------------+  PFV      Full                                                         +---------+---------------+---------+-----------+----------+--------------+  POP      Full           Yes      Yes                                  +---------+---------------+---------+-----------+----------+--------------+  PTV      Full                                                          +---------+---------------+---------+-----------+----------+--------------+  PERO     None                    No                   Acute          +---------+---------------+---------+-----------+----------+--------------+        Summary: RIGHT: - No evidence of common femoral vein obstruction.   LEFT: - Findings consistent with acute deep vein thrombosis involving the left peroneal veins. - No cystic structure found in the popliteal fossa.        ROS    Objective:     BP (!) 104/54   Pulse 69   Ht 5\' 6"  (1.676 m)   Wt 169 lb (76.7 kg)   SpO2 98%   BMI 27.28 kg/m    Physical Exam Vitals and nursing note reviewed.  Constitutional:      Appearance: She is well-developed.  HENT:     Head: Normocephalic and atraumatic.  Pulmonary:     Effort: Pulmonary effort is normal.  Musculoskeletal:     Comments: Left leg in general looks more pale compared to her right.  She has some edema trace, from the thigh down.  Skin:    General: Skin is warm and dry.  Neurological:     Mental Status: She is alert and oriented to person, place, and time.  Psychiatric:        Behavior: Behavior normal.      No results found for any visits on 06/03/23.    The 10-year ASCVD risk score (Arnett DK, et al., 2019) is: 17.2%    Assessment &  Plan:   Problem List Items Addressed This Visit   None Visit Diagnoses     Acute deep vein thrombosis (DVT) of left peroneal vein (HCC)    -  Primary      Recommend dental movements of that leg.  She still having a fair amount of pain so we discussed starting Tylenol around-the-clock she still has some old hydromorphone tabs and okay to try those and see if they are helpful if they are I do not mind calling some in.  Also make sure not wearing any type of compressive device or knee brace on that leg.  Plan to follow-up in 3 months.  Will likely do a repeat ultrasound at that point in time.  She can let me know when she needs  refills on the Eliquis she has enough for at least a month  Return in about 3 months (around 08/23/2023) for follow up DVT. Marland Kitchen    Nani Gasser, MD

## 2023-06-03 NOTE — Patient Instructions (Signed)
Recommend starting 2 extra strength Tylenol 3 times a day around-the-clock for the next 1 to 2 weeks. Okay to try your hydromorphone.  If you feel like it is helpful and happy to send in some extra tabs to the pharmacy.  You will just need to let me know what dose or strength you have at home.

## 2023-06-04 ENCOUNTER — Inpatient Hospital Stay (HOSPITAL_COMMUNITY): Admission: RE | Admit: 2023-06-04 | Payer: Medicare Other | Source: Ambulatory Visit

## 2023-06-04 LAB — NON GYN CYTOLOGY

## 2023-06-04 NOTE — Telephone Encounter (Signed)
error 

## 2023-06-07 MED ORDER — JANUVIA 100 MG PO TABS
100 | ORAL_TABLET | ORAL | 3 refills | Status: DC
Start: 2023-06-07 — End: 2024-07-05

## 2023-06-07 NOTE — Other (Signed)
Cytology of urine is negative for high grade bladder cancer. Since she is not interested in cystoscopy, recommend to follow up as needed. If she sees more blood then again strongly recommend cysto.

## 2023-06-09 ENCOUNTER — Encounter: Payer: Self-pay | Admitting: Family Medicine

## 2023-06-09 ENCOUNTER — Telehealth: Payer: Self-pay | Admitting: Family Medicine

## 2023-06-09 ENCOUNTER — Other Ambulatory Visit: Payer: Self-pay | Admitting: *Deleted

## 2023-06-09 DIAGNOSIS — Z79899 Other long term (current) drug therapy: Secondary | ICD-10-CM

## 2023-06-09 NOTE — Telephone Encounter (Signed)
Form completed and given bqack to Lincoln Brigham

## 2023-06-09 NOTE — Telephone Encounter (Signed)
Form started for pcp and placed in providers box for review and signature. Will await return back to me to be faxed to [facility] Squibb [facility].

## 2023-06-09 NOTE — Telephone Encounter (Signed)
Pt walked in and dropped off a Rx form to be filled out so she can get her medication and I gave that form to [name] B and instructed her to call pt when it is ready for pick up.

## 2023-06-09 NOTE — Telephone Encounter (Signed)
Patient called and taked to Eliquis and she is dropping by a form to be filled out and needed to filled out

## 2023-06-09 NOTE — Telephone Encounter (Signed)
-----   Message from Laura Roman, [place] sent at 06/07/2023 12:11 PM EDT -----  Cytology of urine is negative for high grade bladder cancer. Since she is not interested in cystoscopy, recommend to follow up as needed. If she sees more blood then again strongly recommend cysto.

## 2023-06-09 NOTE — Telephone Encounter (Signed)
Lvm for pt to call office back. Please inform pt of what Pa Aloia stated, thank you .    ----- Message from London Sheer, PA sent at 06/07/2023 12:11 PM EDT -----  Cytology of urine is negative for high grade bladder cancer. Since she is not interested in cystoscopy, recommend to follow up as needed. If she sees more blood then again strongly recommend cysto.

## 2023-06-10 LAB — CBC WITH DIFFERENTIAL/PLATELET
Absolute Monocytes: 817 {cells}/uL (ref 200–950)
Basophils Absolute: 74 {cells}/uL (ref 0–200)
Basophils Relative: 1.1 %
Eosinophils Absolute: 221 {cells}/uL (ref 15–500)
Eosinophils Relative: 3.3 %
HCT: 35.7 % (ref 35.0–45.0)
Hemoglobin: 11.2 g/dL — ABNORMAL LOW (ref 11.7–15.5)
Lymphs Abs: 1802 {cells}/uL (ref 850–3900)
MCH: 27.5 pg (ref 27.0–33.0)
MCHC: 31.4 g/dL — ABNORMAL LOW (ref 32.0–36.0)
MCV: 87.7 fL (ref 80.0–100.0)
MPV: 10.4 fL (ref 7.5–12.5)
Monocytes Relative: 12.2 %
Neutro Abs: 3786 {cells}/uL (ref 1500–7800)
Neutrophils Relative %: 56.5 %
Platelets: 464 10*3/uL — ABNORMAL HIGH (ref 140–400)
RBC: 4.07 10*6/uL (ref 3.80–5.10)
RDW: 13.8 % (ref 11.0–15.0)
Total Lymphocyte: 26.9 %
WBC: 6.7 10*3/uL (ref 3.8–10.8)

## 2023-06-10 LAB — COMPLETE METABOLIC PANEL WITH GFR
AG Ratio: 1.5 (calc) (ref 1.0–2.5)
ALT: 26 U/L (ref 6–29)
AST: 26 U/L (ref 10–35)
Albumin: 4 g/dL (ref 3.6–5.1)
Alkaline phosphatase (APISO): 100 U/L (ref 37–153)
BUN: 8 mg/dL (ref 7–25)
CO2: 28 mmol/L (ref 20–32)
Calcium: 9.5 mg/dL (ref 8.6–10.4)
Chloride: 98 mmol/L (ref 98–110)
Creat: 0.76 mg/dL (ref 0.60–1.00)
Globulin: 2.7 g/dL (ref 1.9–3.7)
Glucose, Bld: 73 mg/dL (ref 65–99)
Potassium: 4.9 mmol/L (ref 3.5–5.3)
Sodium: 135 mmol/L (ref 135–146)
Total Bilirubin: 0.3 mg/dL (ref 0.2–1.2)
Total Protein: 6.7 g/dL (ref 6.1–8.1)
eGFR: 84 mL/min/{1.73_m2} (ref >=60)

## 2023-06-10 NOTE — Telephone Encounter (Signed)
Form faxed, confirmation received and scanned into pt's chart.

## 2023-06-10 NOTE — Progress Notes (Signed)
Hemoglobin is low and stable.  Patient should take multivitamin with iron.  CMP is normal.

## 2023-06-10 NOTE — Telephone Encounter (Signed)
Form signed and given back to [place] B box

## 2023-06-13 ENCOUNTER — Other Ambulatory Visit: Payer: Self-pay | Admitting: Internal Medicine

## 2023-06-15 ENCOUNTER — Ambulatory Visit: Payer: Medicare Other | Admitting: Physician Assistant

## 2023-06-15 DIAGNOSIS — J300 Vasomotor rhinitis: Secondary | ICD-10-CM | POA: Diagnosis not present

## 2023-06-15 DIAGNOSIS — M1712 Unilateral primary osteoarthritis, left knee: Secondary | ICD-10-CM | POA: Diagnosis not present

## 2023-06-17 ENCOUNTER — Ambulatory Visit (HOSPITAL_COMMUNITY): Admit: 2023-06-17 | Payer: Medicare Other | Admitting: Orthopedic Surgery

## 2023-06-17 SURGERY — TRANSFORAMINAL LUMBAR INTERBODY FUSION (TLIF) WITH PEDICLE SCREW FIXATION 1 LEVEL
Anesthesia: General | Laterality: Left

## 2023-06-18 ENCOUNTER — Telehealth: Payer: Self-pay | Admitting: Family Medicine

## 2023-06-18 NOTE — Telephone Encounter (Signed)
Pharmacy called in regards to new prescription of eliquis 5mg  they need verification that this is the medication because the fax was cut off please advise [phone]

## 2023-06-21 ENCOUNTER — Encounter: Payer: Self-pay | Admitting: Family Medicine

## 2023-06-21 MED ORDER — APIXABAN 5 MG PO TABS: 5.0000 mg | ORAL_TABLET | Freq: Two times a day (BID) | ORAL | 3 refills | Status: DC

## 2023-06-23 DIAGNOSIS — I82409 Acute embolism and thrombosis of unspecified deep veins of unspecified lower extremity: Secondary | ICD-10-CM

## 2023-06-23 HISTORY — DX: Acute embolism and thrombosis of unspecified deep veins of unspecified lower extremity: I82.409

## 2023-06-23 NOTE — Telephone Encounter (Signed)
Rx has been resent 

## 2023-06-25 ENCOUNTER — Ambulatory Visit: Admit: 2023-06-25 | Payer: Medicare Other | Admitting: Neurosurgery

## 2023-06-25 ENCOUNTER — Ambulatory Visit (INDEPENDENT_AMBULATORY_CARE_PROVIDER_SITE_OTHER): Payer: Medicare Other | Admitting: Family Medicine

## 2023-06-25 VITALS — BP 126/64 | HR 59 | Ht 66.0 in | Wt 178.0 lb

## 2023-06-25 DIAGNOSIS — E538 Deficiency of other specified B group vitamins: Secondary | ICD-10-CM | POA: Diagnosis not present

## 2023-06-25 SURGERY — POSTERIOR LUMBAR FUSION 1 LEVEL
Anesthesia: General

## 2023-06-25 MED ORDER — CYANOCOBALAMIN 1000 MCG/ML IJ SOLN
1000.0000 ug | Freq: Once | INTRAMUSCULAR | Status: AC
Start: 2023-06-25 — End: 2023-06-25
  Administered 2023-06-25: 1000 ug via INTRAMUSCULAR

## 2023-06-25 NOTE — Progress Notes (Signed)
Patient is here for a vitamin B12 injection. Denies GI problems or dizziness.   Patient tolerated injection well without complications. Pt is advised to schedule next injection in 30 days.  Location:  [facility]

## 2023-06-25 NOTE — Progress Notes (Signed)
Agree with documentation as above.   Catherine Metheney, MD  

## 2023-06-28 ENCOUNTER — Other Ambulatory Visit: Payer: Self-pay | Admitting: Adult Health

## 2023-06-29 ENCOUNTER — Encounter: Payer: Self-pay | Admitting: Family Medicine

## 2023-07-12 ENCOUNTER — Ambulatory Visit: Payer: Self-pay | Admitting: Licensed Clinical Social Worker

## 2023-07-12 NOTE — Patient Instructions (Signed)
Visit Information  Thank you for taking time to visit with me today. Please don't hesitate to contact me if I can be of assistance to you.   Following are the goals we discussed today:   Goals Addressed             This Visit's Progress    Patient Stated she wanted to talk with PCP about program support       Interventions:  Spoke with client via phone today about  her current status and needs faced Discussed program support with client. Described roles of RN, LCSW, Pharmacist in program support Discussed PCP support of client.  Client sees Dr. Nani Gasser as PCP. Encouraged client to talk with PCP about program support Discussed medication procurement of client . Discussed appetite of client. Discussed sleeping issues of client Discussed pain issues of client. She said she has occasional knee pain issue Client resides with her spouse. She likes to play game for relaxation. She enjoys going to [employer] Thanked client for phone call with [employer] client LCSW name and phone number.  Encouraged Aza to call LCSW as needed to discuss program support        Our next appointment is by telephone on 09/14/23 at 10:00 AM   Please call the care guide team at [phone] if you need to cancel or reschedule your appointment.   If you are experiencing a Mental Health or Behavioral Health Crisis or need someone to talk to, please go to [facility] [address], [place] [phone])   The patient verbalized understanding of instructions, educational materials, and care plan provided today and DECLINED offer to receive copy of patient instructions, educational materials, and care plan.   The patient has been provided with contact information for the care management team and has been advised to call with any health related questions or concerns.   Kelton Pillar.Tyrihanna Wingert MSW, LCSW Licensed Visual merchandiser [facility] Care  Management [phone]

## 2023-07-12 NOTE — Patient Outreach (Signed)
Care Coordination   Follow Up Visit Note   07/12/2023 Name: Mia [name] MRN: [id number] DOB: [birth year]  Mia [name] is a 72 y.o. year old female who sees Metheney, Barbarann Ehlers, MD for primary care. I spoke with  Mia [name] by phone today.  What matters to the patients health and wellness today?  Patient wants to talk with PCP about program support      Goals Addressed             This Visit's Progress    Patient Stated she wanted to talk with PCP about program support       Interventions:  Spoke with client via phone today about  her current status and needs faced Discussed program support with client. Described roles of RN, LCSW, Pharmacist in program support Discussed PCP support of client.  Client sees Dr. Nani Gasser as PCP. Encouraged client to talk with PCP about program support Discussed medication procurement of client . Discussed appetite of client. Discussed sleeping issues of client Discussed pain issues of client. She said she has occasional knee pain issue Client resides with her spouse. She likes to play game for relaxation. She enjoys going to [employer] Thanked client for phone call with [employer] client LCSW name and phone number.  Encouraged Mia [name] to call LCSW as needed to discuss program support        SDOH assessments and interventions completed:  Yes  SDOH Interventions Today    Flowsheet Row Most Recent Value  SDOH Interventions   Physical Activity Interventions Other (Comments)  [sometimes uses a walker to help her walk]  Stress Interventions Other (Comment)  [has stress occasionally in managing her medical needs]        Care Coordination Interventions:  Yes, provided   Interventions Today    Flowsheet Row Most Recent Value  Chronic Disease   Chronic disease during today's visit Other  [spoke with client about client needs]  General Interventions   General Interventions Discussed/Reviewed General Interventions  Discussed, [employer] program support with RN, LCSW, Pharmacist]  Exercise Interventions   Exercise Discussed/Reviewed Physical Activity  [uses walker to help her walk]  Physical Activity Discussed/Reviewed Physical Activity Discussed  Education Interventions   Education Provided Provided Education  Provided Verbal Education On Community Resources  Mental Health Interventions   Mental Health Discussed/Reviewed Coping Strategies  [no mood issues noted]  Nutrition Interventions   Nutrition Discussed/Reviewed Nutrition Discussed  Pharmacy Interventions   Pharmacy Dicussed/Reviewed Pharmacy Topics Discussed        Follow up plan: Follow up call scheduled for 09/14/23 at 10:00 AM    Encounter Outcome:  Patient Visit Completed   Kelton Pillar.Kalan Rinn MSW, LCSW Licensed Visual merchandiser [facility] Care Management [phone]

## 2023-07-14 DIAGNOSIS — Z1231 Encounter for screening mammogram for malignant neoplasm of breast: Secondary | ICD-10-CM | POA: Diagnosis not present

## 2023-07-14 LAB — HM MAMMOGRAPHY

## 2023-07-15 ENCOUNTER — Encounter: Admit: 2023-07-15 | Discharge: 2023-07-15 | Payer: MEDICARE | Primary: Internal Medicine

## 2023-07-15 ENCOUNTER — Inpatient Hospital Stay: Admit: 2023-07-15 | Payer: Medicare Other | Primary: Internal Medicine

## 2023-07-15 DIAGNOSIS — E119 Type 2 diabetes mellitus without complications: Secondary | ICD-10-CM

## 2023-07-15 LAB — LIPID PANEL
Chol/HDL Ratio: 2.5 (ref 0–5.0)
Cholesterol, Total: 114 mg/dL (ref <200)
HDL: 46 mg/dL (ref 40–60)
LDL Cholesterol: 41 mg/dL (ref 0–100)
Triglycerides: 135 mg/dL (ref <150)
VLDL Cholesterol Calculated: 27 mg/dL

## 2023-07-15 LAB — CBC WITH AUTO DIFFERENTIAL
Basophils %: 0 % (ref 0–2)
Basophils Absolute: 0 10*3/uL (ref 0.0–0.1)
Eosinophils %: 2 % (ref 0–5)
Eosinophils Absolute: 0.1 10*3/uL (ref 0.0–0.4)
Hematocrit: 42.4 % (ref 35.0–45.0)
Hemoglobin: 12.8 g/dL (ref 12.0–16.0)
Immature Granulocytes %: 0 % (ref 0.0–0.5)
Immature Granulocytes Absolute: 0 10*3/uL (ref 0.00–0.04)
Lymphocytes %: 15 % — ABNORMAL LOW (ref 21–52)
Lymphocytes Absolute: 1.3 10*3/uL (ref 0.9–3.6)
MCH: 29.8 pg (ref 24.0–34.0)
MCHC: 30.2 g/dL — ABNORMAL LOW (ref 31.0–37.0)
MCV: 98.8 fL (ref 78.0–100.0)
MPV: 9.5 fL (ref 9.2–11.8)
Monocytes %: 8 % (ref 3–10)
Monocytes Absolute: 0.7 10*3/uL (ref 0.05–1.2)
Neutrophils %: 75 % — ABNORMAL HIGH (ref 40–73)
Neutrophils Absolute: 6.2 10*3/uL (ref 1.8–8.0)
Nucleated RBCs: 0 /100{WBCs}
Platelets: 208 10*3/uL (ref 135–420)
RBC: 4.29 M/uL (ref 4.20–5.30)
RDW: 13.7 % (ref 11.6–14.5)
WBC: 8.4 10*3/uL (ref 4.6–13.2)
nRBC: 0 10*3/uL (ref 0.00–0.01)

## 2023-07-15 LAB — COMPREHENSIVE METABOLIC PANEL
ALT: 16 U/L (ref 13–56)
AST: 16 U/L (ref 10–38)
Albumin/Globulin Ratio: 1.1 (ref 0.8–1.7)
Albumin: 3.6 g/dL (ref 3.4–5.0)
Alk Phosphatase: 84 U/L (ref 45–117)
Anion Gap: 6 mmol/L (ref 3.0–18)
BUN/Creatinine Ratio: 14 (ref 12–20)
BUN: 11 mg/dL (ref 7.0–18)
CO2: 28 mmol/L (ref 21–32)
Calcium: 8.8 mg/dL (ref 8.5–10.1)
Chloride: 105 mmol/L (ref 100–111)
Creatinine: 0.78 mg/dL (ref 0.6–1.3)
Est, Glom Filt Rate: 81 mL/min/{1.73_m2} (ref >60)
Globulin: 3.2 g/dL (ref 2.0–4.0)
Glucose: 108 mg/dL — ABNORMAL HIGH (ref 74–99)
Potassium: 4.1 mmol/L (ref 3.5–5.5)
Sodium: 139 mmol/L (ref 136–145)
Total Bilirubin: 0.4 mg/dL (ref 0.2–1.0)
Total Protein: 6.8 g/dL (ref 6.4–8.2)

## 2023-07-15 LAB — ALBUMIN/CREATININE RATIO, URINE
Albumin Urine: 3.78 mg/dL — ABNORMAL HIGH (ref 0–3.0)
Albumin/Creatinine Ratio: 22 mg/g (ref 0–30)
Creatinine, Ur: 172 mg/dL — ABNORMAL HIGH (ref 30–125)

## 2023-07-15 LAB — HEMOGLOBIN A1C W/O EAG: Hemoglobin A1C: 5.9 % — ABNORMAL HIGH (ref 4.2–5.6)

## 2023-07-21 DIAGNOSIS — R2689 Other abnormalities of gait and mobility: Secondary | ICD-10-CM | POA: Diagnosis not present

## 2023-07-21 DIAGNOSIS — R42 Dizziness and giddiness: Secondary | ICD-10-CM | POA: Diagnosis not present

## 2023-07-22 ENCOUNTER — Encounter: Payer: MEDICARE | Attending: Internal Medicine | Primary: Internal Medicine

## 2023-07-22 NOTE — Progress Notes (Deleted)
 72 y.o. female who presents for evaluation.    She continues to see Dr Cloria and Dr Jannine for the LEFT breast ca.  The neuropathy is controlled by taking the gaba at night, rarey takes it in the daytime.  She has mammo upcoming next month    Denied any cardiovascular complaints. Her husband had heart bypass back in Nov and then her mom had hip fx a few weeks later.  Her day is taken up helping both as mom is now at [place] on hospice program apparently    No gi or gu issues and the constipation issues are controlled on fiber and hydration.  Seeing Dr Squatrito yearly for the endomet ca    Denies polyuria, polydipsia, nocturia, vision change.     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 Larri    DM (diabetes mellitus) (HCC) 10/2017    on basis of fbs>125; intol metformin    Dyslipidemia     Endometrial cancer (HCC) 02/2017    Dr Margret; stage 1B gr 1 endometroid adenoca w foci squamous diff of the endometrium; RA TLH BSO PLND, 0/13LN, MSI intact    Frozen shoulder     right Dr. 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 Arlice, Dr. Larri    Osteopenia     DEXA -0.5 spine, -0.8 hip (1/18); 0.5 spine, -1.3 hip (3/22); -1.1 wrist, -1.1 hip (4/23)    Overweight (BMI 25.0-29.9)      IF 7/18 start weight 168 lbs not doing     Painless hematuria 04/2016    Dr Trudy, neg eval    Palpitations 2008    neg thallium 2008, nl holter 2008, echo nl lv/ef 65%/tr mr/dd/nl pasp    Syncope     neurocardiogenic by tilt 1994    TMJ syndrome 2023    LEFT after [place] tooth surgery    Venous insufficiency     Vitamin D deficiency      Past Surgical History:   Procedure Laterality Date    BREAST LUMPECTOMY  07/2020    and LN dissection Dr Cloria    COLONOSCOPY      Dr Rubin (2007) neg; (04/22/18) neg    HEMORRHOID SURGERY      Dr. Rubin 2007    HYSTERECTOMY (CERVIX STATUS UNKNOWN)  03/11/2017    Dr Margret    TUBAL LIGATION      UROLOGICAL SURGERY  06/2016    Dr Trudy; bladder bx showed benign lesions    US  BREAST BIOPSY W LOC DEVICE 1ST LESION  LEFT Left 02/21/2020    US  BREAST NEEDLE BIOPSY LEFT 02/21/2020 [facility] US      Social History     Socioeconomic History    Marital status: Married     Spouse name: Not on file    Number of children: 2    Years of education: Not on file    Highest education level: Not on file   Occupational History    Occupation: ret benefits specialist   Tobacco Use    Smoking status: Never     Passive exposure: Never    Smokeless tobacco: Never   Vaping Use    Vaping status: Never Used   Substance and Sexual Activity    Alcohol use: Yes     Alcohol/week: 4.0 standard drinks of alcohol    Drug use: No    Sexual activity: Not Currently     Partners: Male   Other Topics Concern    Not on file   Social History Narrative    Not on file     Social Determinants of Health     Financial Resource Strain: Low Risk  (07/16/2022)    Overall Financial Resource Strain (CARDIA)     Difficulty of Paying Living Expenses: Not hard at all   Food Insecurity: Not on file (07/16/2022)   Transportation Needs: Unknown (07/16/2022)    PRAPARE - Therapist, Art (Medical): Not on file     Lack of Transportation (Non-Medical): No   Physical Activity: Inactive (01/12/2023)    Exercise  Vital Sign     Days of Exercise per Week: 0 days     Minutes of Exercise per Session: 0 min   Stress: Not on file   Social Connections: Not on file   Intimate Partner Violence: Not on file   Housing Stability: Unknown (07/16/2022)    Housing Stability Vital Sign     Unable to Pay for Housing in the Last Year: Not on file     Number of Places Lived in the Last Year: Not on file     Unstable Housing in the Last Year: No     Current Outpatient Medications   Medication Sig    JANUVIA  100 MG tablet take 1 tablet by mouth once daily    Multiple Vitamins-Minerals (MULTIVITAMIN ADULTS 50+ PO) Take 1 tablet by mouth daily    lansoprazole  (PREVACID ) 30 MG delayed release capsule take 1 capsule by mouth BEFORE BREAKFAST DAILY    atorvastatin  (LIPITOR) 40 MG tablet take 1 tablet by mouth once daily    gabapentin  (NEURONTIN ) 100 MG capsule Take 3 capsules by mouth in the morning and at bedtime for 180 days. Intended supply: 30 days Max Daily Amount: 600 mg (Patient taking differently: Take 3 capsules by mouth nightly. Intended supply: 30 days)    citalopram  (CELEXA ) 10 MG tablet take 1 tablet by mouth once daily    Cholecalciferol (VITAMIN D3) 1.25 MG (50000 UT) CAPS Take 1 capsule by mouth once a week    Lancets MISC Use daily as directed    aspirin 81 MG chewable tablet Take 1 tablet by mouth daily     No current facility-administered medications for this visit.     Allergies   Allergen Reactions    Fish Oil Nausea Only     Patient denies  Other reaction(s): gi distress  Patient denies    Lidocaine Other (See Comments)     anxious    Metformin Diarrhea  Nabumetone Nausea Only    Thimerosal (Thiomersal) Hives     REVIEW OF SYSTEMS:  mammo 4/23, colo 6/19 Dr Rubin, DEXA 3/22    There were no vitals filed for this visit.  Affect is appropriate.  Mood stable  No apparent distress  HEENT --Anicteric sclerae.  No JVD, or bruits.  Thyroid fullness on left  Lungs --Clear to auscultation and percussion, normal percussion.  Heart  --Regular rate and rhythm, no murmurs, rubs, gallops, or clicks.  Abdomen -- Soft and nontender, no hepatosplenomegaly or masses.  Extremities -- Without cyanosis, clubbing, tr ankle edema notd. 2+ pulses equally and bilaterally.  Varicose veins bilat    LABS  From 5/10 showed   gluc 110, cr 0.70,               alt 23,                                   chol 154, tg 143, hdl 45, ldl-c 80,                                                            tsh 1.91  From 5/10 showed                       2 hr GTT 89  From 8/10 showed                                                                vit d 23.0, ck 57, aldo 5.4  From 8/11 showed   gluc 108,                                     hba1c 6.3,                   chol 160, tg 172, hdl 39, ldl-c 87,  wbc 5.,7 hb 12.3, plt 235, ua neg,     tsh 2.28  From 5/12 showed   gluc 113, cr 0.71, gfr 94,  alt 16, hba1c 6.2, ldl-p 1989, chol 177, tg 161, hdl 43, ldl-c 102  From 11/12 showed                                                     hba1c 5.9, ldl-p 1218, chol 133, tg 116, hdl 45, ldl-c 65  From 5/13 showed   gluc 105, cr 0.55, gfr 102, alt 8,  hba1c 6.2,                   chol 148, tg 107, hdl 45, ldl-c 82,  wbc 5.0, hb 12.5, plt 172, vit d 40.3  From 11/13 showed         hba1c 6.2, ldl-p 1888,  chol 176, tg 155, hdl 49, ldl-c 86,  wbc 6.2, hb 12.3, plt 182, vit d 34.4  From 5/14 showed         hba1c 6.2,      chol 152, tg 157, hdl 39, ldl-c 82  From 1/15 showed   gluc 113, cr 0.68, gfr>60, alt 11, hba1c 6.2,      chol 146, tg 130, hdl 43, ldl-c 77  From 7/15 showed         hba1c 6.3,      chol 133, tg 124, hdl 39, ldl-c 69,  wbc 6.3, hb 12.2, plt 193  From 6/16 showed   gluc 106, cr 0.74, gfr>60, alt 26, hba1c 6.3,      chol 126, tg 125, hdl 46, ldl-c 55,  wbc 5.7, hb 13.2, plt 203, vit d 7.2,   tsh 1.69  From 12/16 showed gluc 110, cr 0.68, gfr>60, alt 30, hba1c 6.1,      chol 135, tg 147, hdl 47, ldl-c 59,  wbc 6.0, hb 12.7, plt 197  From 6/17 showed   gluc 122, cr 0.71,  gfr>60, alt 33, hba1c 6.2,      chol 153, tg 140, hdl 46, ldl-c 79,  wbc 6.3, hb 13.1, plt 190,           tsh 1.92, hep c-  From 1/18 showed         hba1c 6.2,     chol 154, tg 181, hdl 41, ldl-c 77  From 7/18 showed   gluc 129, cr 0.63, gfr>60, alt 26, hba1c 6.1,                 wbc 6.0, hb 12.4, plt 193, vit d 79  From 1/19 showed   gluc 132, cr 0.83, gfr>60,     hba1c 6.1  From 4/19 showed         hba1c 7.0, umar 12 ,   chol 124, tg 109, hdl 46, ldl-c 56,                    vit d 56.1  From 7/19 showed   gluc 106, cr 0.67, gfr>60, alt 25, hba1c 6.0,                 wbc 5.8, hb 13.0, plt 183  From 1/20 showed        hba1c 6.0, umar 17,    chol 130, tg 113, hdl 46, ldl-c 61  From 8/20 showed   gluc 103, cr 0.73, gfr>60, alt 27, hba1c 5.7,       chol 124, tg 150, hdl 44, ldl-c 50,  wbc 5.0, hb13.0, plt 190  Form 2/21 showed         hba1c 5.9, umar na,             vit d 88.9  From 8/21 showed   gluc 109, cr 0.68, gfr>60, alt 35, hba1c 5.5,       chol 156, tg 142, hdl 51, ldl-c 77, wbc 4.2, hb 9.9,   plt 152, vit d 34.8  From 3/22 showed   gluc 100, cr 0.68, gfr>60, alt 22, hba1c 5.7,       chol 129, tg 137, hdl 48, ldl-c 54  From 9/22 showed   gluc 110, cr 0.72, gfr>60, alt 24, hba1c 5.8,       chol 128, tg 129, hdl 44, ldl-c 58, wbc 5.6, hb 12.0, plt 184  From 3/23  showed   gluc 115, cr 0.65, gfr>60,     hba1c 5.7,               vit d 93.3  From 3/24 showed   gluc 120, cr 0.70, gfr>60,     hba1c 5.7    Results for orders placed or performed during the hospital encounter of 07/15/23 (from the past 2160 hour(s))   CBC with Auto Differential   Result Value Ref Range    WBC 8.4 4.6 - 13.2 K/uL    RBC 4.29 4.20 - 5.30 M/uL    Hemoglobin 12.8 12.0 - 16.0 g/dL    Hematocrit 57.5 64.9 - 45.0 %    MCV 98.8 78.0 - 100.0 FL    MCH 29.8 24.0 - 34.0 PG    MCHC 30.2 (L) 31.0 - 37.0 g/dL    RDW 86.2 88.3 - 85.4 %    Platelets 208 135 - 420 K/uL    MPV 9.5 9.2 - 11.8 FL    Nucleated RBCs 0.0 0 PER 100 WBC    nRBC 0.00 0.00 - 0.01  K/uL    Neutrophils % 75 (H) 40 - 73 %    Lymphocytes % 15 (L) 21 - 52 %    Monocytes % 8 3 - 10 %    Eosinophils % 2 0 - 5 %    Basophils % 0 0 - 2 %    Immature Granulocytes % 0 0.0 - 0.5 %    Neutrophils Absolute 6.2 1.8 - 8.0 K/UL    Lymphocytes Absolute 1.3 0.9 - 3.6 K/UL    Monocytes Absolute 0.7 0.05 - 1.2 K/UL    Eosinophils Absolute 0.1 0.0 - 0.4 K/UL    Basophils Absolute 0.0 0.0 - 0.1 K/UL    Immature Granulocytes Absolute 0.0 0.00 - 0.04 K/UL    Differential Type AUTOMATED     Comprehensive Metabolic Panel   Result Value Ref Range    Sodium 139 136 - 145 mmol/L    Potassium 4.1 3.5 - 5.5 mmol/L    Chloride 105 100 - 111 mmol/L    CO2 28 21 - 32 mmol/L    Anion Gap 6 3.0 - 18 mmol/L    Glucose 108 (H) 74 - 99 mg/dL    BUN 11 7.0 - 18 MG/DL    Creatinine 9.21 0.6 - 1.3 MG/DL    BUN/Creatinine Ratio 14 12 - 20      Est, Glom Filt Rate 81 >60 ml/min/1.11m2    Calcium  8.8 8.5 - 10.1 MG/DL    Total Bilirubin 0.4 0.2 - 1.0 MG/DL    ALT 16 13 - 56 U/L    AST 16 10 - 38 U/L    Alk Phosphatase 84 45 - 117 U/L    Total Protein 6.8 6.4 - 8.2 g/dL    Albumin 3.6 3.4 - 5.0 g/dL    Globulin 3.2 2.0 - 4.0 g/dL    Albumin/Globulin Ratio 1.1 0.8 - 1.7     HEMOGLOBIN A1C W/O EAG   Result Value Ref Range    Hemoglobin A1C 5.9 (H) 4.2 - 5.6 %   Lipid Panel   Result Value Ref Range    LIPID PANEL        Cholesterol, Total 114 <200 MG/DL    Triglycerides 864 <849 MG/DL    HDL 46 40 - 60 MG/DL    LDL Cholesterol 41 0 - 100 MG/DL    VLDL Cholesterol Calculated 27 MG/DL  Chol/HDL Ratio 2.5 0 - 5.0     Microalbumin / Creatinine Urine Ratio   Result Value Ref Range    Microalb, Ur 3.78 (H) 0 - 3.0 MG/DL    Creatinine, Ur 827.99 (H) 30 - 125 mg/dL    Microalb/Creat Ratio 22 0 - 30 mg/g   Results for orders placed or performed in visit on 06/02/23 (from the past 2160 hour(s))   Non Gyn Cytology   Result Value Ref Range    APRESULT AP results    AMB POC URINALYSIS DIP STICK AUTO W/O MICRO   Result Value Ref Range    Color, Urine,  POC Yellow     Clarity, Urine, POC Clear     Glucose, Urine, POC Negative     Bilirubin, Urine, POC Negative     Ketones, Urine, POC Trace     Specific Gravity, Urine, POC 1.020 1.001 - 1.035    Blood, Urine, POC Trace-intact     pH, Urine, POC 6.0 4.6 - 8.0    Protein, Urine, POC Negative     Urobilinogen, POC 0.2 mg/dL     Nitrite, Urine, POC Negative     Leukocyte Esterase, Urine, POC Trace      We reviewed the patient's labs from the last several visits to point out trends in the numbers        Patient Active Problem List   Diagnosis    Dyslipidemia    Anxiety    Vitamin D deficiency    History of breast cancer    Neuropathy due to chemotherapeutic drug (HCC)    Overweight with body mass index (BMI) of 25 to 25.9 in adult    Arthritis, degenerative    Controlled type 2 diabetes mellitus, without long-term current use of insulin (HCC)       Assessment and plan:  1.  DM.  Continue januvia  and controlled.  F/u ophth  2.  Hyperlipidemia. Continue lipitor   3.  Hypovitaminosis D. Supplementation per onco  4.  Anxiety.  Continue celexa   5.  Overweight.  Lifestyle and dietary measures.  Portion control reiterated.  6.  Endometrial ca.  Per Dr Margret  7.  Breast ca.  Per Dr GORMAN Spray and Dr Cloria  8.  Possible neuropathy from chemo.  Continue gaba  9.  Venous disease.  Elevate, declined diuretic  10.  Prevnar 20 given.  Advocated RSV  11.  DEXA ordered      RTC 9/24    Above conditions discussed at length and patient vocalized understanding.  All questions answered to patient satisfaction    {No diagnosis found. (Refresh or delete this SmartLink)}

## 2023-07-26 ENCOUNTER — Encounter: Payer: Self-pay | Admitting: Family Medicine

## 2023-07-26 ENCOUNTER — Ambulatory Visit (INDEPENDENT_AMBULATORY_CARE_PROVIDER_SITE_OTHER): Payer: Medicare Other

## 2023-07-26 VITALS — BP 106/57 | HR 63 | Ht 66.0 in

## 2023-07-26 DIAGNOSIS — Z23 Encounter for immunization: Secondary | ICD-10-CM

## 2023-07-26 DIAGNOSIS — E538 Deficiency of other specified B group vitamins: Secondary | ICD-10-CM | POA: Diagnosis not present

## 2023-07-26 MED ORDER — CYANOCOBALAMIN 1000 MCG/ML IJ SOLN
1000.0000 ug | Freq: Once | INTRAMUSCULAR | Status: AC
Start: 2023-07-26 — End: 2023-07-26
  Administered 2023-07-26: 1000 ug via INTRAMUSCULAR

## 2023-07-26 NOTE — Patient Instructions (Signed)
Return in 30 days for next B12 injection as nurse visit.

## 2023-07-26 NOTE — Progress Notes (Signed)
Established Patient Office Visit  Subjective   Patient ID: Mia [name], female    DOB: 01-Sep-1951  Age: 72 y.o. MRN: [id number]  Chief Complaint  Patient presents with   B12 deficiency    B12 injection nurse visit.     HPI  B12 deficiency - B12 injection nurse visit. Patient denies irregular heart rate  or GI issues.   ROS    Objective:     BP (!) 106/57   Pulse 63   Ht 5\' 6"  (1.676 m)   SpO2 99%   BMI 28.73 kg/m    Physical Exam   No results found for any visits on 07/26/23.    The 10-year ASCVD risk score (Arnett DK, et al., 2019) is: 17.8%    Assessment & Plan:  B12 inj admin 1,02mcg IM LUOQ. Patient tolerated injection well without complications.  Patient will return in 30 days for next B12 injection.  Problem List Items Addressed This Visit       Other   B12 deficiency - Primary   Other Visit Diagnoses     Encounter for immunization       Relevant Orders   Flu Vaccine Trivalent High Dose (Fluad) (Completed)       Return in about 1 month (around 08/25/2023) for B12 injection as nurse visit. Elizabeth Palau, LPN

## 2023-07-29 ENCOUNTER — Ambulatory Visit: Payer: Medicare Other | Admitting: Adult Health

## 2023-08-04 ENCOUNTER — Encounter: Payer: Self-pay | Admitting: Internal Medicine

## 2023-08-04 ENCOUNTER — Ambulatory Visit: Payer: Medicare Other | Admitting: Internal Medicine

## 2023-08-04 VITALS — BP 126/80 | HR 81 | Ht 66.0 in | Wt 180.0 lb

## 2023-08-04 DIAGNOSIS — M810 Age-related osteoporosis without current pathological fracture: Secondary | ICD-10-CM

## 2023-08-04 DIAGNOSIS — Z862 Personal history of diseases of the blood and blood-forming organs and certain disorders involving the immune mechanism: Secondary | ICD-10-CM | POA: Diagnosis not present

## 2023-08-04 LAB — CBC
HCT: 33 % — ABNORMAL LOW (ref 36.0–46.0)
Hemoglobin: 10.6 g/dL — ABNORMAL LOW (ref 12.0–15.0)
MCHC: 32.1 g/dL (ref 30.0–36.0)
MCV: 85.7 fL (ref 78.0–100.0)
Platelets: 338 10*3/uL (ref 150.0–400.0)
RBC: 3.85 Mil/uL — ABNORMAL LOW (ref 3.87–5.11)
RDW: 17.5 % — ABNORMAL HIGH (ref 11.5–15.5)
WBC: 5 10*3/uL (ref 4.0–10.5)

## 2023-08-04 LAB — VITAMIN D 25 HYDROXY (VIT D DEFICIENCY, FRACTURES): VITD: 22.28 ng/mL — ABNORMAL LOW (ref 30.00–100.00)

## 2023-08-04 LAB — ALBUMIN: Albumin: 4 g/dL (ref 3.5–5.2)

## 2023-08-04 NOTE — Patient Instructions (Signed)
Start Calcium citrate 1200 mg daily

## 2023-08-04 NOTE — Progress Notes (Signed)
Name: Mia [name]  MRN/ DOB: [id number], 01/29/1951    Age/ Sex: 72 y.o., female     PCP: Agapito Games, MD   Reason for Endocrinology Evaluation: Hyponatremia      Initial Endocrinology Clinic Visit: 05/21/2020    PATIENT IDENTIFIER: Mia [name] is a 72 y.o., female with a past medical history of emphysema, OSA on CPAP , SVT and Raynaud's phenomenon, RA ,and memory loss . She has followed with [facility] since 05/21/2020 for consultative assistance with management of her Hyponatremia       HISTORICAL SUMMARY:  Pt has been noted with hyponatremia in 02/2020 with a nadir of 129 mmol/L.  She was consuming ~ 60-80 oz of crushed ice as well as ~ 40 oz of fluid a day. She was on HCTZ at the time, which we stopped.    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   She was drinking ~160 oz of fluids a day in addition to being on HCTZ. Sodium normalized with fluid reduction and stopping HCTZ.   Patient returned to endocrinology clinic for consultation osteoporosis in 12/15/2021   OSTEOPOROSIS HISTORY:  Pt was diagnosed with osteoporosis:2020  Menarche at age : 60 Menopausal at age : Hysterectomy at age 8 due to bleeding . Oophorectomy at age 5 Fracture Hx: Probable right third metatarsal stress fracture 10/2020 (status post fall )left foot "jones fracture " Hx of HRT: Yes, for years  FH of osteoporosis or hip fracture:  sister  Prior Hx of anti-estrogenic therapy :no Prior Hx of anti-resorptive therapy : Alendronate in 2016 switched to  Prolia in 08/2017 until 11/2021    Received 1st dose of zoledronic acid 01/06/2022   24-hr urinary calcium 56 mg 11/2021  SUBJECTIVE:    Today (08/04/2023):  [name] is here for a follow up on osteoporosis.  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  Pending back and knee replacement  Has noted multiple falls  Denies bone fractures  Has occasional heartburn , on PPI  Has back pain, radiates to the LE due to DJD   She is not on MVI, calcium nor vitamin D       HISTORY:  Past Medical History:  Past Medical History:  Diagnosis Date   Anal fissure    Anemia    Arthritis    Blood transfusion    C. difficile colitis    Chest pain    a. 01/2013 MV: EF 59%, no ischemia.   Chronic Dyspnea    a. 01/2013 Echo: EF 60-65%, Gr 1 DD, PASP 37mmHg.// Echo 01/2020: EF 60-65, no RWMA, GR 1 DD, GLS -21.6%, normal RV SF, trivial MR    COVID-19    DDD (degenerative disc disease), cervical    DDD (degenerative disc disease), lumbar    Diabetes mellitus    Fibromyalgia    Gall stones    GERD (gastroesophageal reflux disease)    gastritis   Hemorrhoids    Hypertension    Interstitial cystitis    MCI (mild cognitive impairment) 11/25/2017   Memory difficulty 12/14/2013   Neuromuscular disorder (HCC)    sclerosis   Osteoporosis    Peptic ulcer    PONV (postoperative nausea  and vomiting)    Pseudogout    Raynaud phenomenon    Rectal bleeding    Sleep apnea    a. on cpap.   Stress fracture of left tibia 11/05/2016   SVT (supraventricular tachycardia) (HCC)    Syncope 09/10/2015   Thyroid disease    hypothyroidism   Tremor, essential 05/14/2016   Past Surgical History:  Past Surgical History:  Procedure Laterality Date   APPENDECTOMY     BLADDER SURGERY     x2   BLADDER SURGERY     BREAST BIOPSY     CARDIAC CATHETERIZATION     CARPAL TUNNEL RELEASE     CATARACT EXTRACTION Bilateral    CHOLECYSTECTOMY     DILATION AND CURETTAGE OF UTERUS     ENTEROCELE REPAIR     x2   EYE SURGERY     retina   herniated disc  11/25/2021   L4 and L5   hysterectomy - unknown type     JOINT REPLACEMENT     KNEE  ARTHROPLASTY     KNEE SURGERY     x6   NECK SURGERY     fusion   OOPHORECTOMY     QUADRICEPS REPAIR Right    RECTOCELE REPAIR     x2   SHOULDER ARTHROSCOPY WITH SUBACROMIAL DECOMPRESSION, ROTATOR CUFF REPAIR AND BICEP TENDON REPAIR  10/06/2012   Procedure: SHOULDER ARTHROSCOPY WITH SUBACROMIAL DECOMPRESSION, ROTATOR CUFF REPAIR AND BICEP TENDON REPAIR;  Surgeon: Mable Paris, MD;  Location: [facility];  Service: Orthopedics;  Laterality: Right;  Arthroscopic  Repair  of  Subscapularis, Open Biceps Tenodesis   SHOULDER SURGERY     bilateral- bones spur   SHOULDER SURGERY Left 04/15/2021   TONSILLECTOMY     TOTAL KNEE ARTHROPLASTY     TOTAL SHOULDER ARTHROPLASTY     Social History:  reports that she has never smoked. She has never been exposed to tobacco smoke. She has never used smokeless tobacco. She reports that she does not drink alcohol and does not use drugs. Family History:  Family History  Problem Relation Age of Onset   Heart attack Mother    Stroke Mother    Diabetes Mother    Heart disease Mother    Colon polyps Mother    Asthma Mother    Dementia Mother    Alzheimer's disease Mother    Heart disease Father    Heart attack Father    Asthma Father    Stroke Sister    Hypertension Sister    Rheum arthritis Sister    Dementia Sister    Asthma Sister    Parkinson's disease Sister    Lupus Sister    Asthma Sister    Breast cancer Maternal Grandmother    Wilson's disease Maternal Grandmother    Pancreatic cancer Maternal Grandfather    Autoimmune disease Daughter        in eyes   Arthritis Daughter    Deep vein thrombosis Daughter      HOME MEDICATIONS: Allergies as of 08/04/2023       Reactions   Codeine Nausea And Vomiting   Other Reaction(s): UNKNOWN   Myrbetriq  [mirabegron] Other (See Comments)   SEVERE HEADACHE   Nystatin Other (See Comments)   Unknown reaction   Percocet [oxycodone-acetaminophen] Nausea And Vomiting    Propoxyphene Nausea And Vomiting   Other Reaction(s): UNKNOWN   Erythromycin Base    Other Reaction(s): UNKNOWN   Ketorolac    Other  Reaction(s): UNKNOWN   Oxycodone-acetaminophen Nausea Only   Celecoxib Other (See Comments)   Headache/flushed   Darvocet [propoxyphene N-acetaminophen] Nausea And Vomiting   Erythromycin Nausea And Vomiting   Nausea and vomiting   Hydrocodone Nausea And Vomiting   Nitrofurantoin Nausea And Vomiting   Other Reaction(s): UNKNOWN   Pregabalin Swelling   Legs swelling   Toradol [ketorolac Tromethamine] Nausea And Vomiting   Per patient, only PO form causes nausea and vomiting. Can take injection without issue   Tramadol Nausea And Vomiting   Verapamil Nausea And Vomiting        Medication List        Accurate as of August 04, 2023 10:53 AM. If you have any questions, ask your nurse or doctor.          acetaminophen 500 MG tablet Commonly known as: TYLENOL Take 2,000 mg by mouth every 6 (six) hours as needed (pain.).   albuterol 108 (90 Base) MCG/ACT inhaler Commonly known as: VENTOLIN HFA Inhale 1-2 puffs into the lungs every 6 (six) hours as needed for shortness of breath or wheezing.   ALIGN PO Take 1 capsule by mouth in the morning.   amLODipine 5 MG tablet Commonly known as: NORVASC Take 1 tablet (5 mg total) by mouth daily. What changed:  when to take this additional instructions   apixaban 5 MG Tabs tablet Commonly known as: ELIQUIS Take 1 tablet (5 mg total) by mouth 2 (two) times daily.   cetirizine 10 MG tablet Commonly known as: ZYRTEC Take 10 mg by mouth in the morning.   colestipol 1 g tablet Commonly known as: COLESTID Take 1 g by mouth daily at 12 noon. (1100)   cyanocobalamin 1000 MCG/ML injection Commonly known as: VITAMIN B12 Inject 1,000 mcg into the muscle every 28 (twenty-eight) days.   donepezil 10 MG tablet Commonly known as: ARICEPT TAKE 1 TABLET IN THE EVENING AT BEDTIME   gabapentin 300 MG  capsule Commonly known as: NEURONTIN Take 1 capsule (300 mg total) by mouth in the morning and at bedtime.   ipratropium 0.06 % nasal spray Commonly known as: ATROVENT Place 2 sprays into both nostrils 2 (two) times daily as needed for rhinitis.   leflunomide 20 MG tablet Commonly known as: ARAVA TAKE 1 TABLET BY MOUTH EVERY DAY   Melatonin 10 MG Caps Take 10 mg by mouth at bedtime.   memantine 10 MG tablet Commonly known as: NAMENDA TAKE 1 TABLET BY MOUTH TWO TIMES A DAY **MUST SEE DR FOR FURTHER REFILLS**   metoprolol tartrate 100 MG tablet Commonly known as: LOPRESSOR Take 1 tablet (100 mg total) by mouth 2 (two) times daily.   ondansetron 8 MG disintegrating tablet Commonly known as: ZOFRAN-ODT Take 8 mg by mouth every 8 (eight) hours as needed for nausea or vomiting.   OneTouch Ultra test strip Generic drug: glucose blood ULTRA 2. CHECK BLOOD GLUCOSE 3 TIMES DAILY.   pantoprazole 20 MG tablet Commonly known as: PROTONIX Take 20 mg by mouth daily before breakfast.   primidone 50 MG tablet Commonly known as: MYSOLINE TAKE 1 TABLET BY MOUTH EVERYDAY AT BEDTIME   sodium chloride 5 % ophthalmic solution Commonly known as: MURO 128 Place 1 drop into both eyes in the morning, at noon, in the evening, and at bedtime.   valsartan 160 MG tablet Commonly known as: DIOVAN TAKE 1 TABLET BY MOUTH 2 TIMES DAILY.   venlafaxine XR 75 MG 24 hr capsule Commonly known as: EFFEXOR-XR TAKE  1 CAPSULE DAILY WITH BREAKFAST   zoledronic acid 5 MG/100ML Soln injection Commonly known as: RECLAST Inject 5 mg into the vein as directed. Received infusion once yearly          OBJECTIVE:   PHYSICAL EXAM: VS: BP 126/80 (BP Location: Left Arm, Patient Position: Sitting, Cuff Size: Large)   Pulse 81   Ht 5\' 6"  (1.676 m)   Wt 180 lb (81.6 kg)   SpO2 99%   BMI 29.05 kg/m    EXAM: General: Pt appears well and is in NAD  Lungs: Clear with good BS bilat   Heart: Auscultation:  RRR.  Extremities:  BL LE: Trace pretibial on the left  Mental Status: Judgment, insight: Intact Orientation: Oriented to time, place, and person Mood and affect: No depression, anxiety, or agitation     DATA REVIEWED:   Latest Reference Range & Units 08/04/23 11:20  Calcium 8.6 - 10.4 mg/dL 9.5  Albumin 3.5 - 5.2 g/dL 4.0    Latest Reference Range & Units 08/04/23 11:20  VITD 30.00 - 100.00 ng/mL 22.28 (L)  WBC 4.0 - 10.5 K/uL 5.0  RBC 3.87 - 5.11 Mil/uL 3.85 (L)  Hemoglobin 12.0 - 15.0 g/dL 19.1 (L)  HCT 47.8 - 29.5 % 33.0 (L)  MCV 78.0 - 100.0 fl 85.7  MCHC 30.0 - 36.0 g/dL 62.1  RDW 30.8 - 65.7 % 17.5 (H)  Platelets 150.0 - 400.0 K/uL 338.0    Latest Reference Range & Units 08/04/23 11:20  PTH, Intact 16 - 77 pg/mL 34      Latest Reference Range & Units 06/09/23 13:31  Sodium 135 - 146 mmol/L 135  Potassium 3.5 - 5.3 mmol/L 4.9  Chloride 98 - 110 mmol/L 98  CO2 20 - 32 mmol/L 28  Glucose 65 - 99 mg/dL 73  BUN 7 - 25 mg/dL 8  Creatinine 8.46 - 9.62 mg/dL 9.52  Calcium 8.6 - 84.1 mg/dL 9.5  BUN/Creatinine Ratio 6 - 22 (calc) SEE NOTE:  eGFR > OR = 60 mL/min/1.23m2 84  AG Ratio 1.0 - 2.5 (calc) 1.5  AST 10 - 35 U/L 26  ALT 6 - 29 U/L 26  Total Protein 6.1 - 8.1 g/dL 6.7  Total Bilirubin 0.2 - 1.2 mg/dL 0.3      01/16/4009 2/72/5366 Change from prior  RFN NA -2.6   Right total hip -1.3 -1.10 Up 4%  LFN NA -1.6   Left total hip -1.4 -0.9 Up to 8%  AP spine +0.00 +1.1 Up 11%    ASSESSMENT / PLAN / RECOMMENDATIONS:   Osteoporosis    -Patient continues to be on a low calcium diet, I have encouraged patient again to start calcium citrate due to better absorption profile --In review of her chart the patient has been on alendronate in 2016, she believes she started having jaw pains and also because it was once weekly dosing she was forgetting it  -She was on Prolia from 2018 until 11/2021 we opted to start drug holiday -She received her first zoledronic acid  injection 01/06/2022 without any side effects -We again emphasized the importance of optimizing calcium intake -Also discussed weightbearing exercises but she has an steady gait and high risk of falling -We will repeat DXA  -No evidence of secondary causes for osteoporosis based on prior testing. -Repeat labs show normal calcium, PTH and low vitamin D   2.  Vitamin D insufficiency:  -Patient to start OTC vitamin D 3 2000 IU daily   3.  Anemia:  -This was done based on patient request -This is chronic in nature -Will defer to PCP     F/U 1 year  I spent 25 minutes preparing to see the patient by review of recent labs, imaging and procedures, obtaining and reviewing separately obtained history, communicating with the patient, ordering medications, tests or procedures, and documenting clinical information in the EHR including the differential Dx, treatment, and any further evaluation and other management    Signed electronically by: Lyndle Herrlich, MD  [facility] End[facility]  [facility] [address] [place] [place], [place] [address] Phone: [phone] FAX: [phone]      CC: Agapito Games, MD [address] [place] [address] Phone: [phone]  Fax: [phone]   Return to Endocrinology clinic as below: Future Appointments  Date Time Provider Department Center  08/04/2023 11:30 AM Janes Colegrove, Konrad Dolores, MD [facility] None  08/23/2023 10:30 AM Agapito Games, MD [facility] None  08/26/2023  9:50 AM Gearldine Bienenstock, PA-C [facility] None  08/27/2023 11:20 AM Sharlene Dory, PA-C [facility]  09/14/2023 10:00 AM Isaiah Blakes, LCSW [facility] None  11/03/2023 10:00 AM Butch Penny, NP [facility] None  11/18/2023  9:30 AM Agapito Games, MD [facility] None  05/29/2024  9:00 AM [facility]-ANNUAL WELLNESS VISIT [facility] None

## 2023-08-05 LAB — PTH, INTACT AND CALCIUM
Calcium: 9.5 mg/dL (ref 8.6–10.4)
PTH: 34 pg/mL (ref 16–77)

## 2023-08-10 ENCOUNTER — Ambulatory Visit: Admit: 2023-08-10 | Discharge: 2023-08-10 | Payer: MEDICARE | Attending: Internal Medicine | Primary: Internal Medicine

## 2023-08-10 DIAGNOSIS — G250 Essential tremor: Secondary | ICD-10-CM

## 2023-08-10 DIAGNOSIS — B078 Other viral warts: Secondary | ICD-10-CM | POA: Diagnosis not present

## 2023-08-10 DIAGNOSIS — L821 Other seborrheic keratosis: Secondary | ICD-10-CM | POA: Diagnosis not present

## 2023-08-10 DIAGNOSIS — L579 Skin changes due to chronic exposure to nonionizing radiation, unspecified: Secondary | ICD-10-CM | POA: Diagnosis not present

## 2023-08-10 DIAGNOSIS — B070 Plantar wart: Secondary | ICD-10-CM | POA: Diagnosis not present

## 2023-08-10 DIAGNOSIS — L814 Other melanin hyperpigmentation: Secondary | ICD-10-CM | POA: Diagnosis not present

## 2023-08-10 NOTE — Progress Notes (Signed)
72 y.o. female who presents for evaluation.    She continues to see Dr Renato Gails and Dr Eulogio Ditch for the LEFT breast ca.  The neuropathy is controlled by taking the gaba at night, could not tolerate daytime dosing due to sedation    Denied any cardiovascular complaints. She has not been following her bp outside but no syncope or recurring presyncope/lightheadedness    Her husband recently had back surgery and mom is now at [facility] after she fractured the other hip and will likely be staying there now.  She is stressed out!    No gi or gu issues and the constipation issues are controlled on fiber and hydration.  Seeing Dr Isac Sarna yearly for the endomet ca    Denies polyuria, polydipsia, nocturia, vision change. Diet controlled, wt stable     01/13/2022 07/16/2022 01/12/2023 06/02/2023 08/10/2023   Vitals        Weight - Scale 161 lb  162 lb  159 lb  155 lb  158 lb      No sx referable to the thyroid    She does report what sounds like action tremor; her dad and brother have something similar. Only bothers her when she's doing things with her arms, no resting component    LAST MEDICARE WELLNESS EXAM: 11/10/16, 11/23/17, 11/29/18, 12/14/19, 01/06/21, 01/13/22, 01/12/23    Past Medical History:   Diagnosis Date    Allergic rhinitis     Anxiety     Breast cancer (HCC) 01/2020    Dr Brock Ra; LEFT [id number] invasive adenoca  ER/PR/Her2 neg Dr Fidela Juneau neoadjuvant AC-Taxol; lumpectomy and Ln dissection 10/21    Compression fx, thoracic spine (HCC) 2007    negative DEXA Dr. Parks Ranger    DM (diabetes mellitus) (HCC) 10/2017    on basis of fbs>125; intol metformin    Dyslipidemia     Endometrial cancer (HCC) 02/2017    Dr Isac Sarna; stage 1B gr 1 endometroid adenoca w foci squamous diff of the endometrium; RA TLH BSO PLND, 0/13LN, MSI intact    Familial tremor     bro/dad have it as well    Frozen shoulder     right Dr. 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 Paulina Fusi, Dr. Parks Ranger    Osteopenia     DEXA -0.5 spine, -0.8 hip (1/18); 0.5 spine, -1.3 hip (3/22); -1.1 wrist, -1.1 hip (4/23)    Overweight (BMI 25.0-29.9)     IF 7/18 start weight 168 lbs not doing     Painless hematuria 04/2016    Dr Mayford Knife, neg eval    Palpitations 2008    neg thallium 2008, nl holter 2008, echo nl lv/ef 65%/tr mr/dd/nl pasp    Syncope     neurocardiogenic by tilt 1994    TMJ syndrome 2023    LEFT after [place]isdon tooth surgery    Venous insufficiency     Vitamin D deficiency      Past Surgical History:   Procedure Laterality Date  BREAST LUMPECTOMY  07/2020    and LN dissection Dr Renato Gails    COLONOSCOPY      Dr Derrell Lolling (2007) neg; (04/22/18) neg    HEMORRHOID SURGERY      Dr. Derrell Lolling 2007    HYSTERECTOMY (CERVIX STATUS UNKNOWN)  03/11/2017    Dr Isac Sarna    HYSTERECTOMY, TOTAL ABDOMINAL (CERVIX REMOVED)      TUBAL LIGATION      UROLOGICAL SURGERY  06/2016    Dr Mayford Knife; bladder bx showed benign lesions    US BREAST BIOPSY W LOC DEVICE 1ST LESION LEFT Left 02/21/2020    US BREAST NEEDLE BIOPSY LEFT 02/21/2020 [facility] [place]     Social History     Socioeconomic History    Marital status: Married     Spouse name: Not on file    Number of children: 2    Years of education: Not on file    Highest education level: Not on file   Occupational History    Occupation: ret benefits specialist   Tobacco Use    Smoking status: Former     Passive exposure: Never    Smokeless tobacco: Never   Vaping Use    Vaping status: Never Used   Substance and Sexual Activity    Alcohol use: Yes     Alcohol/week: 1.0 standard drink of alcohol     Types: 1 Shots of liquor per week     Comment: One drink a week since breast cancer recovery    Drug use:  No    Sexual activity: Not Currently     Partners: Male   Other Topics Concern    Not on file   Social History Narrative    Not on file     Social Determinants of Health     Financial Resource Strain: Low Risk  (08/10/2023)    Overall Financial Resource Strain (CARDIA)     Difficulty of Paying Living Expenses: Not hard at all   Food Insecurity: No Food Insecurity (08/10/2023)    Hunger Vital Sign     Worried About Running Out of Food in the Last Year: Never true     Ran Out of Food in the Last Year: Never true   Transportation Needs: Unknown (08/10/2023)    PRAPARE - Therapist, art (Medical): Not on file     Lack of Transportation (Non-Medical): No   Physical Activity: Inactive (01/12/2023)    Exercise Vital Sign     Days of Exercise per Week: 0 days     Minutes of Exercise per Session: 0 min   Stress: Not on file   Social Connections: Not on file   Intimate Partner Violence: Not on file   Housing Stability: Unknown (08/10/2023)    Housing Stability Vital Sign     Unable to Pay for Housing in the Last Year: Not on file     Number of Times Moved in the Last Year: Not on file     Homeless in the Last Year: No     Current Outpatient Medications   Medication Sig    gabapentin (NEURONTIN) 300 MG capsule Take 1 capsule by mouth daily. Max Daily Amount: 300 mg    Probiotic CHEW Take by mouth    ELDERBERRY PO Take by mouth    JANUVIA 100 MG tablet take 1 tablet by mouth once daily    Multiple Vitamins-Minerals (MULTIVITAMIN ADULTS 50+ PO) Take 1 tablet by mouth daily  lansoprazole (PREVACID) 30 MG delayed release capsule take 1 capsule by mouth BEFORE BREAKFAST DAILY    atorvastatin (LIPITOR) 40 MG tablet take 1 tablet by mouth once daily    citalopram (CELEXA) 10 MG tablet take 1 tablet by mouth once daily    Cholecalciferol (VITAMIN D3) 1.25 MG (50000 UT) CAPS Take 1 capsule by mouth once a week    Lancets MISC Use daily as directed    aspirin 81 MG chewable tablet Take 1 tablet by mouth daily      No current facility-administered medications for this visit.     Allergies   Allergen Reactions    Lidocaine Other (See Comments)     anxious    Metformin Diarrhea    Nabumetone Nausea Only    Thimerosal (Thiomersal) Hives     REVIEW OF SYSTEMS:  mammo 4/23, colo 6/19 Dr Derrell Lolling, DEXA 3/22    Vitals:    08/10/23 1138   BP: 95/65   Site: Left Upper Arm   Position: Sitting   Cuff Size: Medium Adult   Pulse: 96   Resp: 16   Temp: 98.4 F (36.9 C)   TempSrc: Temporal   SpO2: 99%   Weight: 71.7 kg (158 lb)   Height: 1.626 m (5\' 4" )   Affect is appropriate.  Mood stable  No apparent distress  HEENT --Anicteric sclerae.  No JVD, or bruits.  Thyroid fullness on left  Lungs --Clear to auscultation and percussion, normal percussion.  Heart --Regular rate and rhythm, no murmurs, rubs, gallops, or clicks.  Abdomen -- Soft and nontender, no hepatosplenomegaly or masses.  Extremities -- Without cyanosis, clubbing, tr ankle edema notd. 2+ pulses equally and bilaterally.  Varicose veins bilat  Mild action tremor noted intermittently bilat hands    LABS  From 5/10 showed   gluc 110, cr 0.70,               alt 23,                                   chol 154, tg 143, hdl 45, ldl-c 80,                                                            tsh 1.91  From 5/10 showed                       2 hr GTT 89  From 8/10 showed                                                                vit d 23.0, ck 57, aldo 5.4  From 8/11 showed   gluc 108,                                     hba1c 6.3,  chol 160, tg 172, hdl 39, ldl-c 87,  wbc 5.,7 hb 12.3, plt 235, ua neg,     tsh 2.28  From 5/12 showed   gluc 113, cr 0.71, gfr 94,  alt 16, hba1c 6.2, ldl-p 1989, chol 177, tg 161, hdl 43, ldl-c 102  From 11/12 showed                                                     hba1c 5.9, ldl-p 1218, chol 133, tg 116, hdl 45, ldl-c 65  From 5/13 showed   gluc 105, cr 0.55, gfr 102, alt 8,  hba1c 6.2,                   chol 148, tg 107, hdl  45, ldl-c 82,  wbc 5.0, hb 12.5, plt 172, vit d 40.3  From 11/13 showed         hba1c 6.2, ldl-p 1888, chol 176, tg 155, hdl 49, ldl-c 86,  wbc 6.2, hb 12.3, plt 182, vit d 34.4  From 5/14 showed         hba1c 6.2,      chol 152, tg 157, hdl 39, ldl-c 82  From 1/15 showed   gluc 113, cr 0.68, gfr>60, alt 11, hba1c 6.2,      chol 146, tg 130, hdl 43, ldl-c 77  From 7/15 showed         hba1c 6.3,      chol 133, tg 124, hdl 39, ldl-c 69,  wbc 6.3, hb 12.2, plt 193  From 6/16 showed   gluc 106, cr 0.74, gfr>60, alt 26, hba1c 6.3,      chol 126, tg 125, hdl 46, ldl-c 55,  wbc 5.7, hb 13.2, plt 203, vit d 7.2,   tsh 1.69  From 12/16 showed gluc 110, cr 0.68, gfr>60, alt 30, hba1c 6.1,      chol 135, tg 147, hdl 47, ldl-c 59,  wbc 6.0, hb 12.7, plt 197  From 6/17 showed   gluc 122, cr 0.71, gfr>60, alt 33, hba1c 6.2,      chol 153, tg 140, hdl 46, ldl-c 79,  wbc 6.3, hb 13.1, plt 190,           tsh 1.92, hep c-  From 1/18 showed         hba1c 6.2,     chol 154, tg 181, hdl 41, ldl-c 77  From 7/18 showed   gluc 129, cr 0.63, gfr>60, alt 26, hba1c 6.1,                 wbc 6.0, hb 12.4, plt 193, vit d 79  From 1/19 showed   gluc 132, cr 0.83, gfr>60,     hba1c 6.1  From 4/19 showed         hba1c 7.0, umar 12 ,   chol 124, tg 109, hdl 46, ldl-c 56,                    vit d 56.1  From 7/19 showed   gluc 106, cr 0.67, gfr>60, alt 25, hba1c 6.0,                 wbc 5.8, hb 13.0, plt 183  From 1/20 showed        hba1c  6.0, umar 17,    chol 130, tg 113, hdl 46, ldl-c 61  From 8/20 showed   gluc 103, cr 0.73, gfr>60, alt 27, hba1c 5.7,       chol 124, tg 150, hdl 44, ldl-c 50,  wbc 5.0, hb13.0, plt 190  Form 2/21 showed         hba1c 5.9, umar na,             vit d 88.9  From 8/21 showed   gluc 109, cr 0.68, gfr>60, alt 35, hba1c 5.5,       chol 156, tg 142, hdl 51, ldl-c 77, wbc 4.2, hb 9.9,   plt 152, vit d 34.8  From 3/22 showed   gluc 100, cr 0.68, gfr>60, alt 22, hba1c 5.7,       chol 129, tg 137, hdl 48, ldl-c 54  From 9/22  showed   gluc 110, cr 0.72, gfr>60, alt 24, hba1c 5.8,       chol 128, tg 129, hdl 44, ldl-c 58, wbc 5.6, hb 12.0, plt 184  From 3/23 showed   gluc 115, cr 0.65, gfr>60,     hba1c 5.7,               vit d 93.3  From 3/24 showed   gluc 120, cr 0.70, gfr>60,     hba1c 5.7    Results for orders placed or performed during the hospital encounter of 07/15/23 (from the past 2160 hour(s))   CBC with Auto Differential   Result Value Ref Range    WBC 8.4 4.6 - 13.2 K/uL    RBC 4.29 4.20 - 5.30 M/uL    Hemoglobin 12.8 12.0 - 16.0 g/dL    Hematocrit 16.1 09.6 - 45.0 %    MCV 98.8 78.0 - 100.0 FL    MCH 29.8 24.0 - 34.0 PG    MCHC 30.2 (L) 31.0 - 37.0 g/dL    RDW 04.5 40.9 - 81.1 %    Platelets 208 135 - 420 K/uL    MPV 9.5 9.2 - 11.8 FL    Nucleated RBCs 0.0 0 PER 100 WBC    nRBC 0.00 0.00 - 0.01 K/uL    Neutrophils % 75 (H) 40 - 73 %    Lymphocytes % 15 (L) 21 - 52 %    Monocytes % 8 3 - 10 %    Eosinophils % 2 0 - 5 %    Basophils % 0 0 - 2 %    Immature Granulocytes % 0 0.0 - 0.5 %    Neutrophils Absolute 6.2 1.8 - 8.0 K/UL    Lymphocytes Absolute 1.3 0.9 - 3.6 K/UL    Monocytes Absolute 0.7 0.05 - 1.2 K/UL    Eosinophils Absolute 0.1 0.0 - 0.4 K/UL    Basophils Absolute 0.0 0.0 - 0.1 K/UL    Immature Granulocytes Absolute 0.0 0.00 - 0.04 K/UL    Differential Type AUTOMATED     Comprehensive Metabolic Panel   Result Value Ref Range    Sodium 139 136 - 145 mmol/L    Potassium 4.1 3.5 - 5.5 mmol/L    Chloride 105 100 - 111 mmol/L    CO2 28 21 - 32 mmol/L    Anion Gap 6 3.0 - 18 mmol/L    Glucose 108 (H) 74 - 99 mg/dL    BUN 11 7.0 - 18 MG/DL    Creatinine 9.14 0.6 - 1.3 MG/DL    BUN/Creatinine Ratio  14 12 - 20      Est, Glom Filt Rate 81 >60 ml/min/1.59m2    Calcium 8.8 8.5 - 10.1 MG/DL    Total Bilirubin 0.4 0.2 - 1.0 MG/DL    ALT 16 13 - 56 U/L    AST 16 10 - 38 U/L    Alk Phosphatase 84 45 - 117 U/L    Total Protein 6.8 6.4 - 8.2 g/dL    Albumin 3.6 3.4 - 5.0 g/dL    Globulin 3.2 2.0 - 4.0 g/dL    Albumin/Globulin Ratio 1.1  0.8 - 1.7     HEMOGLOBIN A1C W/O EAG   Result Value Ref Range    Hemoglobin A1C 5.9 (H) 4.2 - 5.6 %   Lipid Panel   Result Value Ref Range    LIPID PANEL        Cholesterol, Total 114 <200 MG/DL    Triglycerides 409 <811 MG/DL    HDL 46 40 - 60 MG/DL    LDL Cholesterol 41 0 - 100 MG/DL    VLDL Cholesterol Calculated 27 MG/DL    Chol/HDL Ratio 2.5 0 - 5.0     Microalbumin / Creatinine Urine Ratio   Result Value Ref Range    Microalb, Ur 3.78 (H) 0 - 3.0 MG/DL    Creatinine, Ur 914.78 (H) 30 - 125 mg/dL    Microalb/Creat Ratio 22 0 - 30 mg/g   Results for orders placed or performed in visit on 06/02/23 (from the past 2160 hour(s))   Non Gyn Cytology   Result Value Ref Range    APRESULT AP results    AMB POC URINALYSIS DIP STICK AUTO W/O MICRO   Result Value Ref Range    Color, Urine, POC Yellow     Clarity, Urine, POC Clear     Glucose, Urine, POC Negative     Bilirubin, Urine, POC Negative     Ketones, Urine, POC Trace     Specific Gravity, Urine, POC 1.020 1.001 - 1.035    Blood, Urine, POC Trace-intact     pH, Urine, POC 6.0 4.6 - 8.0    Protein, Urine, POC Negative     Urobilinogen, POC 0.2 mg/dL     Nitrite, Urine, POC Negative     Leukocyte Esterase, Urine, POC Trace      We reviewed the patient's labs from the last several visits to point out trends in the numbers        Patient Active Problem List   Diagnosis    Dyslipidemia    Anxiety    Vitamin D deficiency    History of breast cancer    Neuropathy due to chemotherapeutic drug (HCC)    Overweight with body mass index (BMI) of 25 to 25.9 in adult    Arthritis, degenerative    Controlled type 2 diabetes mellitus, without long-term current use of insulin (HCC)    Familial tremor       Assessment and plan:  1.  DM.  Continue januvia and controlled.  F/u ophth  2.  Hyperlipidemia. Continue lipitor   3.  Hypovitaminosis D. Supplementation per onco  4.  Anxiety.  Continue celexa  5.  Overweight.  Lifestyle and dietary measures.  Portion control reiterated.  6.   Endometrial ca.  Per Dr Isac Sarna  7.  Breast ca.  Per Dr Fidela Juneau and Dr Renato Gails  8.  Possible neuropathy from chemo.  Continue gaba  9.  Venous disease.  Elevate, declined diuretic  10.  Prevnar 20 given.  Advocated RSV  11.  Familiar tremor.  Quick discussion, declined meds or neuro consult and will observe      RTC 4/25    Above conditions discussed at length and patient vocalized understanding.  All questions answered to patient satisfaction     Diagnosis Orders   1. Familial tremor        2. Controlled type 2 diabetes mellitus without complication, without long-term current use of insulin (HCC)  HEMOGLOBIN A1C W/O EAG    Basic Metabolic Panel      3. Neuropathy due to chemotherapeutic drug (HCC)        4. Dyslipidemia  TSH + Free T4 Panel      5. History of breast cancer        6. Anxiety

## 2023-08-10 NOTE — Progress Notes (Signed)
Laura Roman is a 72 y.o. female (DOB: [birth year]) presenting to address:    Chief Complaint   Patient presents with    6 Month Follow-Up       Vitals:    08/10/23 1138   BP: 95/65   Pulse: 96   Resp: 16   Temp: 98.4 F (36.9 C)   SpO2: 99%       "Have you been to the ER, urgent care clinic since your last visit?  Hospitalized since your last visit?"    NO    "Have you seen or consulted any other health care providers outside of [facility] since your last visit?"    NO

## 2023-08-12 ENCOUNTER — Other Ambulatory Visit: Payer: Self-pay | Admitting: Physician Assistant

## 2023-08-12 NOTE — Telephone Encounter (Signed)
Last Fill: 05/02/2023  Labs: 06/09/2023 Hemoglobin is low and stable.  Patient should take multivitamin with iron.  CMP is normal.   Next Visit: 08/26/2023  Last Visit: 03/26/2023  DX: Seronegative rheumatoid arthritis   Current Dose per office note 03/26/2023: Arava 20 mg 1 tablet by mouth daily   Okay to refill Arava ?

## 2023-08-12 NOTE — Progress Notes (Unsigned)
Eyes:     Conjunctiva/sclera: Conjunctivae normal.  Cardiovascular:     Rate and Rhythm: Normal rate and regular rhythm.     Heart sounds: Normal heart sounds.  Pulmonary:     Effort: Pulmonary effort is normal.     Breath sounds: Normal breath sounds.  Abdominal:     General: Bowel sounds are normal.     Palpations: Abdomen is soft.  Musculoskeletal:     Cervical back: Normal range of motion.  Lymphadenopathy:     Cervical: No cervical adenopathy.  Skin:    General: Skin is warm and dry.     Capillary Refill: Capillary refill takes less than 2 seconds.  Neurological:     Mental Status: She is alert and oriented to person, place, and time.  Psychiatric:        Behavior: Behavior normal.     Musculoskeletal Exam: C-spine has limited ROM. Postural thoracic kyphosis. Limited mobility of lumbar spine.  Painful ROM of the left shoulder.  Elbow joints, wrist joints, MCPs, PIPs, DIPs have good range of motion with no synovitis.  Tenderness over the MCPs of the left hand and the right second MCP joint.  PIP and DIP thickening noted.  Hip joints have good range of motion with no groin pain.  Painful range of motion of both knees especially the left knee.  Right knee replacement has no warmth or effusion. No left knee joint effusion noted.  Ankle joints have good range of motion with no joint tenderness or synovitis.  CDAI Exam: CDAI Score: -- Patient Global: --; Provider Global: -- Swollen: --; Tender: -- Joint Exam 08/26/2023   No joint exam has been documented for this visit    There is currently no information documented on the homunculus. Go to the Rheumatology activity and complete the homunculus joint exam.  Investigation: No additional findings.  Imaging: DG Bone Density  Result Date: 08/25/2023 EXAM: DUAL X-RAY ABSORPTIOMETRY (DXA) FOR BONE MINERAL DENSITY IMPRESSION: Konrad Dolores [facility] Your patient Elianna Sengupta completed a BMD test on 08/25/2023 using the Lunar IDXA DXA System (analysis version: 16.SP2) manufactured by [employer]. The following summarizes the results of our evaluation. [facility] PATIENT: Name: Mia [name], Mia [name] Patient ID: [id number] Birth Date: 08-Feb-1951 Height: 65.0 in. Gender: Female Measured: 08/25/2023 Weight: 175.2 lbs. Indications: Advanced Age, Caucasian, Early Menopause, Estrogen Deficiency, History of Fracture (Adult), History of Osteoporosis, Hysterectomy, Long Term use of Steriods, Low Calcium Intake, Oophorectomy (Bilateral), Postmenopausal Fractures: Foot Treatments: Anastrozole, Prednisone, Prolia, Reclast ASSESSMENT: The BMD measured at Femur Neck Right is 0.744 g/cm2 with a T-score of -2.1. This patient's diagnostic category is LOW BONE MASS/OSTEOPENIA according to World Health Organization [facility]) criteria.L- 2-3 was excluded due to degenerative changes. The patient will not get a frax because she is on BBM. The scan quality is good. Site Region Measured Date Measured Age WHO YA BMD Classification T-score AP Spine L1-L4 (L2,L3) 08/25/2023 71.9 Normal 0.2 1.190 g/cm2 DualFemur Total Mean 08/25/2023 71.9 years Low Bone Mass -1.7 0.799 g/cm2 Left Forearm Radius 33% 08/25/2023 71.9 Normal -0.3 0.848 g/cm2 World Health Organization [facility]) criteria for post-menopausal, Caucasian Women: Normal        T-score at or above -1 SD Low Bone Mass T-score between -1 and -2.5 SD Osteoporosis  T-score at or below -2.5 SD RECOMMENDATION: 1. All patients should optimize calcium and vitamin D intake. 2. Consider FDA-approved medical therapies in  postmenopausal women and men age 28 years and older, based on the following: a. A hip  Office Visit Note  Patient: Mauriana Fogleman Brashier             Date of Birth: [birth year]           MRN: [id number]             PCP: Agapito Games, MD Referring: Agapito Games, * Visit Date: 08/26/2023 Occupation: @GUAROCC @  Subjective:  Pain in multiple joints   History of Present Illness: BRITNEY BENEKE is a 72 y.o. female with history of seronegative rheumatoid arthritis and osteoarthritis.  Patient remains on Arava 20 mg 1 tablet by mouth daily.  She continues to tolerate Arava without any side effects and has not had any interruptions in therapy.  Patient continues to have chronic pain involving multiple joints.  Patient states that she was supposed to have back surgery performed in August but had to postpone after being diagnosed with a DVT involving her left lower extremity on 06/24/2023.  Patient has been taking Eliquis as prescribed.  Patient has postponed her back surgery as well as her left knee replacement.  She will be following up with Dr. Yevette Edwards in January 2025 to discuss a timeline for proceeding with surgery.  Patient states that she continues to have issues with balance.  She was referred to ENT to rule out an underlying vestibular process.  Her symptoms were felt to be due to a central processing issue and it was recommended for her to follow-up with neurology.  She has a follow-up visit with neurology scheduled in January 2025 but she plans on calling to schedule a sooner office visit. Patient states for the past 2 to 3 months has been experiencing increased pain involving both hands.  She has had increased tenderness involving the left MCP joints.  She has noticed joint swelling intermittently as well as increased joint stiffness especially first thing in the mornings.  She has been taking Tylenol as needed for symptomatic relief. She denies any recent or recurrent infections.     Activities of Daily Living:  Patient reports morning stiffness for 15-20 minutes.    Patient Denies nocturnal pain.  Difficulty dressing/grooming: Denies Difficulty climbing stairs: Reports Difficulty getting out of chair: Denies Difficulty using hands for taps, buttons, cutlery, and/or writing: Denies  Review of Systems  Constitutional:  Positive for fatigue.  HENT:  Positive for mouth dryness. Negative for mouth sores.   Eyes:  Negative for dryness.  Respiratory:  Positive for shortness of breath.        On exertion   Cardiovascular:  Negative for chest pain and palpitations.  Gastrointestinal:  Negative for blood in stool, constipation and diarrhea.  Endocrine: Negative for increased urination.  Genitourinary:  Negative for involuntary urination.  Musculoskeletal:  Positive for joint pain, gait problem, joint pain, myalgias, muscle weakness, morning stiffness, muscle tenderness and myalgias. Negative for joint swelling.  Skin:  Positive for color change. Negative for rash, hair loss and sensitivity to sunlight.  Allergic/Immunologic: Negative for susceptible to infections.  Neurological:  Positive for numbness. Negative for dizziness and headaches.  Hematological:  Negative for swollen glands.  Psychiatric/Behavioral:  Negative for depressed mood and sleep disturbance. The patient is not nervous/anxious.     PMFS History:  Patient Active Problem List   Diagnosis Date Noted  . Acute deep vein thrombosis (DVT) of left peroneal vein (HCC) 08/23/2023  . Pain of right sacroiliac joint 10/15/2022  . Palpitations 06/02/2022  . Pernicious anemia 09/11/2020  . 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  Eyes:     Conjunctiva/sclera: Conjunctivae normal.  Cardiovascular:     Rate and Rhythm: Normal rate and regular rhythm.     Heart sounds: Normal heart sounds.  Pulmonary:     Effort: Pulmonary effort is normal.     Breath sounds: Normal breath sounds.  Abdominal:     General: Bowel sounds are normal.     Palpations: Abdomen is soft.  Musculoskeletal:     Cervical back: Normal range of motion.  Lymphadenopathy:     Cervical: No cervical adenopathy.  Skin:    General: Skin is warm and dry.     Capillary Refill: Capillary refill takes less than 2 seconds.  Neurological:     Mental Status: She is alert and oriented to person, place, and time.  Psychiatric:        Behavior: Behavior normal.     Musculoskeletal Exam: C-spine has limited ROM. Postural thoracic kyphosis. Limited mobility of lumbar spine.  Painful ROM of the left shoulder.  Elbow joints, wrist joints, MCPs, PIPs, DIPs have good range of motion with no synovitis.  Tenderness over the MCPs of the left hand and the right second MCP joint.  PIP and DIP thickening noted.  Hip joints have good range of motion with no groin pain.  Painful range of motion of both knees especially the left knee.  Right knee replacement has no warmth or effusion. No left knee joint effusion noted.  Ankle joints have good range of motion with no joint tenderness or synovitis.  CDAI Exam: CDAI Score: -- Patient Global: --; Provider Global: -- Swollen: --; Tender: -- Joint Exam 08/26/2023   No joint exam has been documented for this visit    There is currently no information documented on the homunculus. Go to the Rheumatology activity and complete the homunculus joint exam.  Investigation: No additional findings.  Imaging: DG Bone Density  Result Date: 08/25/2023 EXAM: DUAL X-RAY ABSORPTIOMETRY (DXA) FOR BONE MINERAL DENSITY IMPRESSION: [facility] Dol[facility] Your patient Elianna Sengupta completed a BMD test on 08/25/2023 using the Lunar IDXA DXA System (analysis version: 16.SP2) manufactured by [employer]. The following summarizes the results of our evaluation. [facility] PATIENT: Name: Mia [name], Mia [name] Patient ID: [id number] Birth Date: 08-Feb-1951 Height: 65.0 in. Gender: Female Measured: 08/25/2023 Weight: 175.2 lbs. Indications: Advanced Age, Caucasian, Early Menopause, Estrogen Deficiency, History of Fracture (Adult), History of Osteoporosis, Hysterectomy, Long Term use of Steriods, Low Calcium Intake, Oophorectomy (Bilateral), Postmenopausal Fractures: Foot Treatments: Anastrozole, Prednisone, Prolia, Reclast ASSESSMENT: The BMD measured at Femur Neck Right is 0.744 g/cm2 with a T-score of -2.1. This patient's diagnostic category is LOW BONE MASS/OSTEOPENIA according to World Health Organization [facility]) criteria.L- 2-3 was excluded due to degenerative changes. The patient will not get a frax because she is on BBM. The scan quality is good. Site Region Measured Date Measured Age WHO YA BMD Classification T-score AP Spine L1-L4 (L2,L3) 08/25/2023 71.9 Normal 0.2 1.190 g/cm2 DualFemur Total Mean 08/25/2023 71.9 years Low Bone Mass -1.7 0.799 g/cm2 Left Forearm Radius 33% 08/25/2023 71.9 Normal -0.3 0.848 g/cm2 World Health Organization [facility]) criteria for post-menopausal, Caucasian Women: Normal        T-score at or above -1 SD Low Bone Mass T-score between -1 and -2.5 SD Osteoporosis  T-score at or below -2.5 SD RECOMMENDATION: 1. All patients should optimize calcium and vitamin D intake. 2. Consider FDA-approved medical therapies in  postmenopausal women and men age 28 years and older, based on the following: a. A hip  Office Visit Note  Patient: Mauriana Fogleman Brashier             Date of Birth: [birth year]           MRN: [id number]             PCP: Agapito Games, MD Referring: Agapito Games, * Visit Date: 08/26/2023 Occupation: @GUAROCC @  Subjective:  Pain in multiple joints   History of Present Illness: BRITNEY BENEKE is a 72 y.o. female with history of seronegative rheumatoid arthritis and osteoarthritis.  Patient remains on Arava 20 mg 1 tablet by mouth daily.  She continues to tolerate Arava without any side effects and has not had any interruptions in therapy.  Patient continues to have chronic pain involving multiple joints.  Patient states that she was supposed to have back surgery performed in August but had to postpone after being diagnosed with a DVT involving her left lower extremity on 06/24/2023.  Patient has been taking Eliquis as prescribed.  Patient has postponed her back surgery as well as her left knee replacement.  She will be following up with Dr. Yevette Edwards in January 2025 to discuss a timeline for proceeding with surgery.  Patient states that she continues to have issues with balance.  She was referred to ENT to rule out an underlying vestibular process.  Her symptoms were felt to be due to a central processing issue and it was recommended for her to follow-up with neurology.  She has a follow-up visit with neurology scheduled in January 2025 but she plans on calling to schedule a sooner office visit. Patient states for the past 2 to 3 months has been experiencing increased pain involving both hands.  She has had increased tenderness involving the left MCP joints.  She has noticed joint swelling intermittently as well as increased joint stiffness especially first thing in the mornings.  She has been taking Tylenol as needed for symptomatic relief. She denies any recent or recurrent infections.     Activities of Daily Living:  Patient reports morning stiffness for 15-20 minutes.    Patient Denies nocturnal pain.  Difficulty dressing/grooming: Denies Difficulty climbing stairs: Reports Difficulty getting out of chair: Denies Difficulty using hands for taps, buttons, cutlery, and/or writing: Denies  Review of Systems  Constitutional:  Positive for fatigue.  HENT:  Positive for mouth dryness. Negative for mouth sores.   Eyes:  Negative for dryness.  Respiratory:  Positive for shortness of breath.        On exertion   Cardiovascular:  Negative for chest pain and palpitations.  Gastrointestinal:  Negative for blood in stool, constipation and diarrhea.  Endocrine: Negative for increased urination.  Genitourinary:  Negative for involuntary urination.  Musculoskeletal:  Positive for joint pain, gait problem, joint pain, myalgias, muscle weakness, morning stiffness, muscle tenderness and myalgias. Negative for joint swelling.  Skin:  Positive for color change. Negative for rash, hair loss and sensitivity to sunlight.  Allergic/Immunologic: Negative for susceptible to infections.  Neurological:  Positive for numbness. Negative for dizziness and headaches.  Hematological:  Negative for swollen glands.  Psychiatric/Behavioral:  Negative for depressed mood and sleep disturbance. The patient is not nervous/anxious.     PMFS History:  Patient Active Problem List   Diagnosis Date Noted  . Acute deep vein thrombosis (DVT) of left peroneal vein (HCC) 08/23/2023  . Pain of right sacroiliac joint 10/15/2022  . Palpitations 06/02/2022  . Pernicious anemia 09/11/2020  . 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  Office Visit Note  Patient: Mauriana Fogleman Brashier             Date of Birth: [birth year]           MRN: [id number]             PCP: Agapito Games, MD Referring: Agapito Games, * Visit Date: 08/26/2023 Occupation: @GUAROCC @  Subjective:  Pain in multiple joints   History of Present Illness: BRITNEY BENEKE is a 72 y.o. female with history of seronegative rheumatoid arthritis and osteoarthritis.  Patient remains on Arava 20 mg 1 tablet by mouth daily.  She continues to tolerate Arava without any side effects and has not had any interruptions in therapy.  Patient continues to have chronic pain involving multiple joints.  Patient states that she was supposed to have back surgery performed in August but had to postpone after being diagnosed with a DVT involving her left lower extremity on 06/24/2023.  Patient has been taking Eliquis as prescribed.  Patient has postponed her back surgery as well as her left knee replacement.  She will be following up with Dr. Yevette Edwards in January 2025 to discuss a timeline for proceeding with surgery.  Patient states that she continues to have issues with balance.  She was referred to ENT to rule out an underlying vestibular process.  Her symptoms were felt to be due to a central processing issue and it was recommended for her to follow-up with neurology.  She has a follow-up visit with neurology scheduled in January 2025 but she plans on calling to schedule a sooner office visit. Patient states for the past 2 to 3 months has been experiencing increased pain involving both hands.  She has had increased tenderness involving the left MCP joints.  She has noticed joint swelling intermittently as well as increased joint stiffness especially first thing in the mornings.  She has been taking Tylenol as needed for symptomatic relief. She denies any recent or recurrent infections.     Activities of Daily Living:  Patient reports morning stiffness for 15-20 minutes.    Patient Denies nocturnal pain.  Difficulty dressing/grooming: Denies Difficulty climbing stairs: Reports Difficulty getting out of chair: Denies Difficulty using hands for taps, buttons, cutlery, and/or writing: Denies  Review of Systems  Constitutional:  Positive for fatigue.  HENT:  Positive for mouth dryness. Negative for mouth sores.   Eyes:  Negative for dryness.  Respiratory:  Positive for shortness of breath.        On exertion   Cardiovascular:  Negative for chest pain and palpitations.  Gastrointestinal:  Negative for blood in stool, constipation and diarrhea.  Endocrine: Negative for increased urination.  Genitourinary:  Negative for involuntary urination.  Musculoskeletal:  Positive for joint pain, gait problem, joint pain, myalgias, muscle weakness, morning stiffness, muscle tenderness and myalgias. Negative for joint swelling.  Skin:  Positive for color change. Negative for rash, hair loss and sensitivity to sunlight.  Allergic/Immunologic: Negative for susceptible to infections.  Neurological:  Positive for numbness. Negative for dizziness and headaches.  Hematological:  Negative for swollen glands.  Psychiatric/Behavioral:  Negative for depressed mood and sleep disturbance. The patient is not nervous/anxious.     PMFS History:  Patient Active Problem List   Diagnosis Date Noted  . Acute deep vein thrombosis (DVT) of left peroneal vein (HCC) 08/23/2023  . Pain of right sacroiliac joint 10/15/2022  . Palpitations 06/02/2022  . Pernicious anemia 09/11/2020  . 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  Eyes:     Conjunctiva/sclera: Conjunctivae normal.  Cardiovascular:     Rate and Rhythm: Normal rate and regular rhythm.     Heart sounds: Normal heart sounds.  Pulmonary:     Effort: Pulmonary effort is normal.     Breath sounds: Normal breath sounds.  Abdominal:     General: Bowel sounds are normal.     Palpations: Abdomen is soft.  Musculoskeletal:     Cervical back: Normal range of motion.  Lymphadenopathy:     Cervical: No cervical adenopathy.  Skin:    General: Skin is warm and dry.     Capillary Refill: Capillary refill takes less than 2 seconds.  Neurological:     Mental Status: She is alert and oriented to person, place, and time.  Psychiatric:        Behavior: Behavior normal.     Musculoskeletal Exam: C-spine has limited ROM. Postural thoracic kyphosis. Limited mobility of lumbar spine.  Painful ROM of the left shoulder.  Elbow joints, wrist joints, MCPs, PIPs, DIPs have good range of motion with no synovitis.  Tenderness over the MCPs of the left hand and the right second MCP joint.  PIP and DIP thickening noted.  Hip joints have good range of motion with no groin pain.  Painful range of motion of both knees especially the left knee.  Right knee replacement has no warmth or effusion. No left knee joint effusion noted.  Ankle joints have good range of motion with no joint tenderness or synovitis.  CDAI Exam: CDAI Score: -- Patient Global: --; Provider Global: -- Swollen: --; Tender: -- Joint Exam 08/26/2023   No joint exam has been documented for this visit    There is currently no information documented on the homunculus. Go to the Rheumatology activity and complete the homunculus joint exam.  Investigation: No additional findings.  Imaging: DG Bone Density  Result Date: 08/25/2023 EXAM: DUAL X-RAY ABSORPTIOMETRY (DXA) FOR BONE MINERAL DENSITY IMPRESSION: [facility]rad Dolores [facility] Your patient Elianna Sengupta completed a BMD test on 08/25/2023 using the Lunar IDXA DXA System (analysis version: 16.SP2) manufactured by [employer]. The following summarizes the results of our evaluation. [facility] PATIENT: Name: Mia [name], Mia [name] Patient ID: [id number] Birth Date: 08-Feb-1951 Height: 65.0 in. Gender: Female Measured: 08/25/2023 Weight: 175.2 lbs. Indications: Advanced Age, Caucasian, Early Menopause, Estrogen Deficiency, History of Fracture (Adult), History of Osteoporosis, Hysterectomy, Long Term use of Steriods, Low Calcium Intake, Oophorectomy (Bilateral), Postmenopausal Fractures: Foot Treatments: Anastrozole, Prednisone, Prolia, Reclast ASSESSMENT: The BMD measured at Femur Neck Right is 0.744 g/cm2 with a T-score of -2.1. This patient's diagnostic category is LOW BONE MASS/OSTEOPENIA according to World Health Organization [facility]) criteria.L- 2-3 was excluded due to degenerative changes. The patient will not get a frax because she is on BBM. The scan quality is good. Site Region Measured Date Measured Age WHO YA BMD Classification T-score AP Spine L1-L4 (L2,L3) 08/25/2023 71.9 Normal 0.2 1.190 g/cm2 DualFemur Total Mean 08/25/2023 71.9 years Low Bone Mass -1.7 0.799 g/cm2 Left Forearm Radius 33% 08/25/2023 71.9 Normal -0.3 0.848 g/cm2 World Health Organization [facility]) criteria for post-menopausal, Caucasian Women: Normal        T-score at or above -1 SD Low Bone Mass T-score between -1 and -2.5 SD Osteoporosis  T-score at or below -2.5 SD RECOMMENDATION: 1. All patients should optimize calcium and vitamin D intake. 2. Consider FDA-approved medical therapies in  postmenopausal women and men age 28 years and older, based on the following: a. A hip  Office Visit Note  Patient: Mauriana Fogleman Brashier             Date of Birth: [birth year]           MRN: [id number]             PCP: Agapito Games, MD Referring: Agapito Games, * Visit Date: 08/26/2023 Occupation: @GUAROCC @  Subjective:  Pain in multiple joints   History of Present Illness: BRITNEY BENEKE is a 72 y.o. female with history of seronegative rheumatoid arthritis and osteoarthritis.  Patient remains on Arava 20 mg 1 tablet by mouth daily.  She continues to tolerate Arava without any side effects and has not had any interruptions in therapy.  Patient continues to have chronic pain involving multiple joints.  Patient states that she was supposed to have back surgery performed in August but had to postpone after being diagnosed with a DVT involving her left lower extremity on 06/24/2023.  Patient has been taking Eliquis as prescribed.  Patient has postponed her back surgery as well as her left knee replacement.  She will be following up with Dr. Yevette Edwards in January 2025 to discuss a timeline for proceeding with surgery.  Patient states that she continues to have issues with balance.  She was referred to ENT to rule out an underlying vestibular process.  Her symptoms were felt to be due to a central processing issue and it was recommended for her to follow-up with neurology.  She has a follow-up visit with neurology scheduled in January 2025 but she plans on calling to schedule a sooner office visit. Patient states for the past 2 to 3 months has been experiencing increased pain involving both hands.  She has had increased tenderness involving the left MCP joints.  She has noticed joint swelling intermittently as well as increased joint stiffness especially first thing in the mornings.  She has been taking Tylenol as needed for symptomatic relief. She denies any recent or recurrent infections.     Activities of Daily Living:  Patient reports morning stiffness for 15-20 minutes.    Patient Denies nocturnal pain.  Difficulty dressing/grooming: Denies Difficulty climbing stairs: Reports Difficulty getting out of chair: Denies Difficulty using hands for taps, buttons, cutlery, and/or writing: Denies  Review of Systems  Constitutional:  Positive for fatigue.  HENT:  Positive for mouth dryness. Negative for mouth sores.   Eyes:  Negative for dryness.  Respiratory:  Positive for shortness of breath.        On exertion   Cardiovascular:  Negative for chest pain and palpitations.  Gastrointestinal:  Negative for blood in stool, constipation and diarrhea.  Endocrine: Negative for increased urination.  Genitourinary:  Negative for involuntary urination.  Musculoskeletal:  Positive for joint pain, gait problem, joint pain, myalgias, muscle weakness, morning stiffness, muscle tenderness and myalgias. Negative for joint swelling.  Skin:  Positive for color change. Negative for rash, hair loss and sensitivity to sunlight.  Allergic/Immunologic: Negative for susceptible to infections.  Neurological:  Positive for numbness. Negative for dizziness and headaches.  Hematological:  Negative for swollen glands.  Psychiatric/Behavioral:  Negative for depressed mood and sleep disturbance. The patient is not nervous/anxious.     PMFS History:  Patient Active Problem List   Diagnosis Date Noted  . Acute deep vein thrombosis (DVT) of left peroneal vein (HCC) 08/23/2023  . Pain of right sacroiliac joint 10/15/2022  . Palpitations 06/02/2022  . Pernicious anemia 09/11/2020  . 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

## 2023-08-19 ENCOUNTER — Other Ambulatory Visit: Payer: Self-pay | Admitting: Medical Genetics

## 2023-08-19 DIAGNOSIS — Z006 Encounter for examination for normal comparison and control in clinical research program: Secondary | ICD-10-CM

## 2023-08-23 ENCOUNTER — Encounter: Payer: Self-pay | Admitting: Family Medicine

## 2023-08-23 ENCOUNTER — Ambulatory Visit (INDEPENDENT_AMBULATORY_CARE_PROVIDER_SITE_OTHER): Payer: Medicare Other | Admitting: Family Medicine

## 2023-08-23 VITALS — BP 119/54 | HR 62 | Ht 66.0 in | Wt 180.0 lb

## 2023-08-23 DIAGNOSIS — Z86718 Personal history of other venous thrombosis and embolism: Secondary | ICD-10-CM | POA: Insufficient documentation

## 2023-08-23 DIAGNOSIS — D649 Anemia, unspecified: Secondary | ICD-10-CM

## 2023-08-23 DIAGNOSIS — E538 Deficiency of other specified B group vitamins: Secondary | ICD-10-CM | POA: Diagnosis not present

## 2023-08-23 DIAGNOSIS — I82452 Acute embolism and thrombosis of left peroneal vein: Secondary | ICD-10-CM | POA: Insufficient documentation

## 2023-08-23 DIAGNOSIS — R7301 Impaired fasting glucose: Secondary | ICD-10-CM | POA: Diagnosis not present

## 2023-08-23 DIAGNOSIS — E559 Vitamin D deficiency, unspecified: Secondary | ICD-10-CM

## 2023-08-23 LAB — POCT GLYCOSYLATED HEMOGLOBIN (HGB A1C): Hemoglobin A1C: 5.5 % (ref 4.0–5.6)

## 2023-08-23 MED ORDER — CYANOCOBALAMIN 1000 MCG/ML IJ SOLN
1000.0000 ug | Freq: Once | INTRAMUSCULAR | Status: AC
Start: 2023-08-23 — End: 2023-08-23
  Administered 2023-08-23: 1000 ug via INTRAMUSCULAR

## 2023-08-23 MED ORDER — VITAMIN D (ERGOCALCIFEROL) 1.25 MG (50000 UNIT) PO CAPS: 50000.0000 [IU] | ORAL_CAPSULE | ORAL | 1 refills | Status: DC

## 2023-08-23 NOTE — Assessment & Plan Note (Signed)
Due to recheck B12

## 2023-08-23 NOTE — Assessment & Plan Note (Signed)
A1C looks much better at 5.5.

## 2023-08-23 NOTE — Assessment & Plan Note (Addendum)
DVT 05/2023:Will repeat ultrasound today we did discuss that there could be some remnant clot still present she does still occasionally get some pain and some swelling in that leg.  The swelling usually more at the end of the day she is not currently wearing any type of compression stocking.

## 2023-08-23 NOTE — Progress Notes (Signed)
Established Patient Office Visit  Subjective   Patient ID: Mia [name], female    DOB: Mar 06, 1951  Age: 72 y.o. MRN: [id number]  Chief Complaint  Patient presents with   Medical Management of Chronic Issues   B12 Injection    HPI  30-month follow-up for B12.  He was diagnosed with a DVT on August 5 after experiencing worsening left knee pain.  It revealed a DVT of the left peroneal vein.  CT angio of the chest was negative for PE.  He was actually scheduled for surgery before this happened.  Plan was to do a repeat ultrasound.  Follow-up pernicious anemia-she had a repeat CBC done on October 9 her hemoglobin had dropped from 11.2 down to 10.6.  She has been consistently getting her B12 injections.  So wanted to give me an update that she did see ENT for some vestibular testing.  She said sometimes she notes when she looks down her vision will get blurry it is not necessarily a vertigo sensation.  She went to her eye doctor first to then told her her version was great and referred her to ENT.  ENT said that it was not an inner ear issue it was a central processing issue and so has recommended that she follow-up with her neurologist for this she actually does have a routine appointment with her neurologist in January.  Has a tickle sensation in the right side of her throat it sometimes even wakes her up at night it has been going on for several weeks.  Also reports that she has not been sleeping well for a few weeks she has been waking up in the middle the night and then having a hard time going back to sleep.  She was diagnosed with obstructive sleep apnea at 1 point and even wore CPAP but said she had repeat testing done about a year ago with [facility]    ROS    Objective:     BP (!) 119/54   Pulse 62   Ht 5\' 6"  (1.676 m)   Wt 180 lb (81.6 kg)   SpO2 98%   BMI 29.05 kg/m    Physical Exam Vitals and nursing note reviewed.  Constitutional:      Appearance: Normal  appearance.  HENT:     Head: Normocephalic and atraumatic.  Eyes:     Conjunctiva/sclera: Conjunctivae normal.  Cardiovascular:     Rate and Rhythm: Normal rate and regular rhythm.  Pulmonary:     Effort: Pulmonary effort is normal.     Breath sounds: Normal breath sounds.  Skin:    General: Skin is warm and dry.  Neurological:     Mental Status: She is alert.  Psychiatric:        Mood and Affect: Mood normal.      Results for orders placed or performed in visit on 08/23/23  POCT HgB A1C  Result Value Ref Range   Hemoglobin A1C 5.5 4.0 - 5.6 %   HbA1c POC (<> result, manual entry)     HbA1c, POC (prediabetic range)     HbA1c, POC (controlled diabetic range)        The 10-year ASCVD risk score (Arnett DK, et al., 2019) is: 22%    Assessment & Plan:   Problem List Items Addressed This Visit       Cardiovascular and Mediastinum   Acute deep vein thrombosis (DVT) of left peroneal vein (HCC)    DVT 05/2023:Will repeat ultrasound  today we did discuss that there could be some remnant clot still present she does still occasionally get some pain and some swelling in that leg.  The swelling usually more at the end of the day she is not currently wearing any type of compression stocking.      Relevant Orders   US Venous Img Lower Unilateral Left     Endocrine   IFG (impaired fasting glucose) - Primary    A1C looks much better at 5.5.        Relevant Orders   POCT HgB A1C (Completed)     Other   B12 deficiency    Due to recheck B12      Relevant Orders   Fe+TIBC+Fer   B12 and Folate Panel   Other Visit Diagnoses     Vitamin D deficiency       Relevant Medications   Vitamin D, Ergocalciferol, (DRISDOL) 1.25 MG (50000 UNIT) CAPS capsule   Anemia, unspecified type       Relevant Medications   cyanocobalamin (VITAMIN B12) injection 1,000 mcg (Completed)   Other Relevant Orders   Fe+TIBC+Fer   B12 and Folate Panel      Anemia -Will recheck B12 levels just  to make sure that they are adequate we will also check for iron deficiency and folic acid deficiency.  Left knee pain-she says she is now questioning whether or not she truly wants to have left knee surgery.  Her husband just had left knee replacement and she is seeing everything that he has gone through and she really worries about her ability to do well with her knee replacement so right now she is going to probably put it off.  Consider discontinuing Eliquis at that point as she had no precipitating factors at that time except for knee pain so it is possible she had been a little bit more sedentary than usual.  No follow-ups on file.    Nani Gasser, MD

## 2023-08-24 ENCOUNTER — Ambulatory Visit: Payer: Medicare Other

## 2023-08-24 ENCOUNTER — Encounter: Payer: Self-pay | Admitting: Family Medicine

## 2023-08-24 DIAGNOSIS — I82452 Acute embolism and thrombosis of left peroneal vein: Secondary | ICD-10-CM | POA: Diagnosis not present

## 2023-08-24 DIAGNOSIS — R0683 Snoring: Secondary | ICD-10-CM

## 2023-08-24 DIAGNOSIS — I82812 Embolism and thrombosis of superficial veins of left lower extremities: Secondary | ICD-10-CM | POA: Diagnosis not present

## 2023-08-24 LAB — IRON,TIBC AND FERRITIN PANEL
Ferritin: 26 ng/mL (ref 15–150)
Iron Saturation: 11 % — ABNORMAL LOW (ref 15–55)
Iron: 52 ug/dL (ref 27–139)
Total Iron Binding Capacity: 479 ug/dL — ABNORMAL HIGH (ref 250–450)
UIBC: 427 ug/dL — ABNORMAL HIGH (ref 118–369)

## 2023-08-24 LAB — B12 AND FOLATE PANEL
Folate: 2.5 ng/mL — ABNORMAL LOW (ref >3.0)
Vitamin B-12: >2000 pg/mL — ABNORMAL HIGH (ref 232–1245)

## 2023-08-24 NOTE — Progress Notes (Signed)
Hi Mia [name], iron/ferritin levels are down a little bit.  So I think we need to get you back on the iron if you have not been taking it as regularly or consistently and then plan to recheck your levels again in 2 to 3 months.  Vitamin B12 was high but you just got your injection.  Your folate is low so I would like you to pick up some over-the-counter folic acid as well we need to get that level above 3.0 we can plan to recheck that in 2 to 3 months as well.

## 2023-08-25 ENCOUNTER — Ambulatory Visit: Payer: Medicare Other

## 2023-08-25 DIAGNOSIS — M810 Age-related osteoporosis without current pathological fracture: Secondary | ICD-10-CM | POA: Diagnosis not present

## 2023-08-25 DIAGNOSIS — Z780 Asymptomatic menopausal state: Secondary | ICD-10-CM | POA: Diagnosis not present

## 2023-08-25 DIAGNOSIS — M85851 Other specified disorders of bone density and structure, right thigh: Secondary | ICD-10-CM | POA: Diagnosis not present

## 2023-08-25 NOTE — Progress Notes (Signed)
Hi Mia [name], ultrasound shows just a real subtle residual part of the clot that was there.  They feel like it is chronic so this may stay there permanently but overall looks much better.  There was also a little bit of inflammation in one of your more superficial veins.  This is not harmful but sometimes can cause a little bit of tenderness or soreness.  So at this point we can stop the Eliquis since she done a full 56-month treatment and we will just need to monitor carefully for any signs and symptoms of new clot.  Make sure that you are staying active not sitting for long periods without getting up moving stretching and exercising your legs and calf muscles.

## 2023-08-26 ENCOUNTER — Encounter: Payer: Self-pay | Admitting: Physician Assistant

## 2023-08-26 ENCOUNTER — Ambulatory Visit: Payer: Medicare Other | Attending: Physician Assistant | Admitting: Physician Assistant

## 2023-08-26 VITALS — BP 110/70 | HR 61 | Resp 14 | Ht 65.0 in | Wt 177.8 lb

## 2023-08-26 DIAGNOSIS — M51369 Other intervertebral disc degeneration, lumbar region without mention of lumbar back pain or lower extremity pain: Secondary | ICD-10-CM

## 2023-08-26 DIAGNOSIS — Z8619 Personal history of other infectious and parasitic diseases: Secondary | ICD-10-CM

## 2023-08-26 DIAGNOSIS — M797 Fibromyalgia: Secondary | ICD-10-CM

## 2023-08-26 DIAGNOSIS — R5382 Chronic fatigue, unspecified: Secondary | ICD-10-CM | POA: Diagnosis not present

## 2023-08-26 DIAGNOSIS — G5601 Carpal tunnel syndrome, right upper limb: Secondary | ICD-10-CM

## 2023-08-26 DIAGNOSIS — M7061 Trochanteric bursitis, right hip: Secondary | ICD-10-CM | POA: Diagnosis not present

## 2023-08-26 DIAGNOSIS — I7300 Raynaud's syndrome without gangrene: Secondary | ICD-10-CM | POA: Diagnosis not present

## 2023-08-26 DIAGNOSIS — R7989 Other specified abnormal findings of blood chemistry: Secondary | ICD-10-CM | POA: Diagnosis not present

## 2023-08-26 DIAGNOSIS — Z8781 Personal history of (healed) traumatic fracture: Secondary | ICD-10-CM | POA: Diagnosis not present

## 2023-08-26 DIAGNOSIS — Z8719 Personal history of other diseases of the digestive system: Secondary | ICD-10-CM

## 2023-08-26 DIAGNOSIS — M5030 Other cervical disc degeneration, unspecified cervical region: Secondary | ICD-10-CM | POA: Diagnosis not present

## 2023-08-26 DIAGNOSIS — M818 Other osteoporosis without current pathological fracture: Secondary | ICD-10-CM

## 2023-08-26 DIAGNOSIS — M1120 Other chondrocalcinosis, unspecified site: Secondary | ICD-10-CM | POA: Diagnosis not present

## 2023-08-26 DIAGNOSIS — N3010 Interstitial cystitis (chronic) without hematuria: Secondary | ICD-10-CM

## 2023-08-26 DIAGNOSIS — Z8679 Personal history of other diseases of the circulatory system: Secondary | ICD-10-CM

## 2023-08-26 DIAGNOSIS — M0600 Rheumatoid arthritis without rheumatoid factor, unspecified site: Secondary | ICD-10-CM

## 2023-08-26 DIAGNOSIS — Z86718 Personal history of other venous thrombosis and embolism: Secondary | ICD-10-CM | POA: Diagnosis not present

## 2023-08-26 DIAGNOSIS — Z9889 Other specified postprocedural states: Secondary | ICD-10-CM

## 2023-08-26 DIAGNOSIS — M5134 Other intervertebral disc degeneration, thoracic region: Secondary | ICD-10-CM

## 2023-08-26 DIAGNOSIS — Z79899 Other long term (current) drug therapy: Secondary | ICD-10-CM

## 2023-08-26 DIAGNOSIS — Z96651 Presence of right artificial knee joint: Secondary | ICD-10-CM | POA: Diagnosis not present

## 2023-08-26 DIAGNOSIS — M7062 Trochanteric bursitis, left hip: Secondary | ICD-10-CM | POA: Insufficient documentation

## 2023-08-26 DIAGNOSIS — M1712 Unilateral primary osteoarthritis, left knee: Secondary | ICD-10-CM | POA: Diagnosis not present

## 2023-08-26 DIAGNOSIS — M7712 Lateral epicondylitis, left elbow: Secondary | ICD-10-CM

## 2023-08-26 NOTE — Patient Instructions (Signed)
Standing Labs We placed an order today for your standing lab work.   Please have your standing labs drawn at end of January and every 3 months   Please have your labs drawn 2 weeks prior to your appointment so that the provider can discuss your lab results at your appointment, if possible.  Please note that you may see your imaging and lab results in MyChart before we have reviewed them. We will contact you once all results are reviewed. Please allow our office up to 72 hours to thoroughly review all of the results before contacting the office for clarification of your results.  WALK-IN LAB HOURS  Monday through Thursday from 8:00 am -12:30 pm and 1:00 pm-5:00 pm and Friday from 8:00 am-12:00 pm.  Patients with office visits requiring labs will be seen before walk-in labs.  You may encounter longer than normal wait times. Please allow additional time. Wait times may be shorter on  Monday and Thursday afternoons.  We do not book appointments for walk-in labs. We appreciate your patience and understanding with our staff.   Labs are drawn by [facility]. Please bring your co-pay at the time of your lab draw.  You may receive a bill from [facility] for your lab work.  Please note if you are on Hydroxychloroquine and and an order has been placed for a Hydroxychloroquine level,  you will need to have it drawn 4 hours or more after your last dose.  If you wish to have your labs drawn at another location, please call the office 24 hours in advance so we can fax the orders.  The office is located at [address], [address], [place], [place] [address]   If you have any questions regarding directions or hours of operation,  please call [phone].   As a reminder, please drink plenty of water prior to coming for your lab work. Thanks!

## 2023-08-27 ENCOUNTER — Encounter: Payer: Self-pay | Admitting: Physician Assistant

## 2023-08-27 ENCOUNTER — Other Ambulatory Visit: Payer: Self-pay | Admitting: *Deleted

## 2023-08-27 ENCOUNTER — Ambulatory Visit: Payer: Medicare Other | Attending: Physician Assistant | Admitting: Physician Assistant

## 2023-08-27 VITALS — BP 110/62 | HR 62 | Ht 65.0 in | Wt 176.2 lb

## 2023-08-27 DIAGNOSIS — I10 Essential (primary) hypertension: Secondary | ICD-10-CM

## 2023-08-27 DIAGNOSIS — E639 Nutritional deficiency, unspecified: Secondary | ICD-10-CM | POA: Diagnosis not present

## 2023-08-27 DIAGNOSIS — R002 Palpitations: Secondary | ICD-10-CM | POA: Insufficient documentation

## 2023-08-27 DIAGNOSIS — R0609 Other forms of dyspnea: Secondary | ICD-10-CM | POA: Insufficient documentation

## 2023-08-27 DIAGNOSIS — I471 Supraventricular tachycardia, unspecified: Secondary | ICD-10-CM | POA: Insufficient documentation

## 2023-08-27 LAB — COMPLETE METABOLIC PANEL WITH GFR
AG Ratio: 1.4 (calc) (ref 1.0–2.5)
ALT: 44 U/L — ABNORMAL HIGH (ref 6–29)
AST: 53 U/L — ABNORMAL HIGH (ref 10–35)
Albumin: 4 g/dL (ref 3.6–5.1)
Alkaline phosphatase (APISO): 96 U/L (ref 37–153)
BUN: 9 mg/dL (ref 7–25)
CO2: 28 mmol/L (ref 20–32)
Calcium: 9.7 mg/dL (ref 8.6–10.4)
Chloride: 102 mmol/L (ref 98–110)
Creat: 0.69 mg/dL (ref 0.60–1.00)
Globulin: 2.8 g/dL (ref 1.9–3.7)
Glucose, Bld: 118 mg/dL — ABNORMAL HIGH (ref 65–99)
Potassium: 4.3 mmol/L (ref 3.5–5.3)
Sodium: 136 mmol/L (ref 135–146)
Total Bilirubin: 0.4 mg/dL (ref 0.2–1.2)
Total Protein: 6.8 g/dL (ref 6.1–8.1)
eGFR: 93 mL/min/{1.73_m2} (ref >=60)

## 2023-08-27 LAB — C-REACTIVE PROTEIN: CRP: 5.9 mg/L (ref <8.0)

## 2023-08-27 LAB — SEDIMENTATION RATE: Sed Rate: 17 mm/h (ref 0–30)

## 2023-08-27 NOTE — Patient Instructions (Signed)
Medication Instructions:   Your physician recommends that you continue on your current medications as directed. Please refer to the Current Medication list given to you today.   *If you need a refill on your cardiac medications before your next appointment, please call your pharmacy*   Lab Work:  Please get fasting labs at your Rheumatologist and fax to [phone].   If you have labs (blood work) drawn today and your tests are completely normal, you will receive your results only by: MyChart Message (if you have MyChart) OR A paper copy in the mail If you have any lab test that is abnormal or we need to change your treatment, we will call you to review the results.   Testing/Procedures:  None ordered.   Follow-Up: At [facility], you and your health needs are our priority.  As part of our continuing mission to provide you with exceptional heart care, we have created designated Provider Care Teams.  These Care Teams include your primary Cardiologist (physician) and Advanced Practice Providers (APPs -  Physician Assistants and Nurse Practitioners) who all work together to provide you with the care you need, when you need it.  We recommend signing up for the patient portal called "MyChart".  Sign up information is provided on this After Visit Summary.  MyChart is used to connect with patients for Virtual Visits (Telemedicine).  Patients are able to view lab/test results, encounter notes, upcoming appointments, etc.  Non-urgent messages can be sent to your provider as well.   To learn more about what you can do with MyChart, go to [email or web address].    Your next appointment:   1 year(s)  Provider:   Kristeen Miss, MD     Other Instructions Your physician wants you to follow-up in: 1 year.  You will receive a reminder letter in the mail two months in advance. If you don't receive a letter, please call our office to schedule the follow-up appointment.

## 2023-08-27 NOTE — Addendum Note (Signed)
Addended by: Chalmers Cater on: 08/27/2023 08:37 AM   Modules accepted: Orders

## 2023-08-27 NOTE — Progress Notes (Signed)
ESR and CRP WNL AST and ALT are elevated-patient has been taking tylenol for pain relief and remains on arava 20 mg daily.  Tylenol should be avoided. Her pain reliever options are limited because she is also currently taking eliquis.  She has been experiencing increased pain and intermittent swelling in both hands--please clarify if she would like to schedule an ultrasound of both hands to assess for synovitis?

## 2023-08-27 NOTE — Addendum Note (Signed)
Addended by: Nani Gasser D on: 08/27/2023 09:16 AM   Modules accepted: Orders

## 2023-08-31 MED ORDER — CITALOPRAM HYDROBROMIDE 10 MG PO TABS
10 | ORAL_TABLET | ORAL | 3 refills | Status: DC
Start: 2023-08-31 — End: 2024-10-13

## 2023-09-03 MED ORDER — LANSOPRAZOLE 30 MG PO CPDR
30 | ORAL_CAPSULE | ORAL | 3 refills | 60.00000 days | Status: DC
Start: 2023-09-03 — End: 2024-11-14

## 2023-09-03 MED ORDER — ATORVASTATIN CALCIUM 40 MG PO TABS
40 | ORAL_TABLET | ORAL | 3 refills | 90.00000 days | Status: DC
Start: 2023-09-03 — End: 2024-10-13

## 2023-09-13 ENCOUNTER — Other Ambulatory Visit (HOSPITAL_COMMUNITY): Payer: Self-pay | Attending: Medical Genetics

## 2023-09-14 ENCOUNTER — Ambulatory Visit: Payer: Self-pay | Admitting: Licensed Clinical Social Worker

## 2023-09-14 NOTE — Patient Instructions (Signed)
Visit Information  Thank you for taking time to visit with me today. Please don't hesitate to contact me if I can be of assistance to you.   Following are the goals we discussed today:   Goals Addressed             This Visit's Progress    Patient Stated she has periodic falls. No serious injuries. She gets dizzy occasionally. Left knee pain       Interventions: Spoke with client about client needs Discussed medication procurement Discussed walking challenges. She has periodic falls. She has had no serious injury from fall. She said she does have cane, walker, wheelchair but at present is not using a device to help her walk She sees PCP for support. PCP is Dr. Nani Gasser. Discussed pain issues. Discussed sleeping issues. Discussed appetite of client Used Active Listening skills to hear client needs Provided counseling support Discussed program support with RN, LCSW, Pharmacist Discussed vision issues. She said she sometimes has blurry vision ; she does see eye specialist for support Discussed visits of client with cardiologist, neurologist, gastroenterologist. Discussed client management of DVT. She has talked with medical provider about  DVT and is taking Eliquis as prescribed. She said she will probably take Eliquis for about 3 months Discussed transport needs. Her spouse helps transport client at needed to and from appointments Thanked client for phone call today with LCSW Encouraged client or spouse to call LCSW as needed for SW support for client.  LCSW phone number was provided to client        Our next appointment is by telephone on 10/25/23 at 3:30 PM   Please call the care guide team at [phone] if you need to cancel or reschedule your appointment.   If you are experiencing a Mental Health or Behavioral Health Crisis or need someone to talk to, please go to [facility] [address] [place] [phone])   The  patient verbalized understanding of instructions, educational materials, and care plan provided today and DECLINED offer to receive copy of patient instructions, educational materials, and care plan.   The patient has been provided with contact information for the care management team and has been advised to call with any health related questions or concerns.   Kelton Pillar.Xoie Kreuser MSW, LCSW Licensed Visual merchandiser [facility] Care Management [phone]

## 2023-09-14 NOTE — Patient Outreach (Signed)
  Care Coordination   Follow Up Visit Note   09/14/2023 Name: Mia [name] MRN: [id number] DOB: 09-May-1951  Mia [name] is a 72 y.o. year old female who sees Metheney, Barbarann Ehlers, MD for primary care. I spoke with  Mia [name] by phone today.  What matters to the patients health and wellness today?  Patient Stated she has periodic falls. No serious injuries. She gets dizzy occasionally. Left knee pain    Goals Addressed             This Visit's Progress    Patient Stated she has periodic falls. No serious injuries. She gets dizzy occasionally. Left knee pain       Interventions: Spoke with client about client needs Discussed medication procurement Discussed walking challenges. She has periodic falls. She has had no serious injury from fall. She said she does have cane, walker, wheelchair but at present is not using a device to help her walk She sees PCP for support. PCP is Dr. Nani Gasser. Discussed pain issues. Discussed sleeping issues. Discussed appetite of client Used Active Listening skills to hear client needs Provided counseling support Discussed program support with RN, LCSW, Pharmacist Discussed vision issues. She said she sometimes has blurry vision ; she does see eye specialist for support Discussed visits of client with cardiologist, neurologist, gastroenterologist. Discussed client management of DVT. She has talked with medical provider about  DVT and is taking Eliquis as prescribed. She said she will probably take Eliquis for about 3 months Discussed transport needs. Her spouse helps transport client at needed to and from appointments Thanked client for phone call today with LCSW Encouraged client or spouse to call LCSW as needed for SW support for client.  LCSW phone number was provided to client        SDOH assessments and interventions completed:  Yes  SDOH Interventions Today    Flowsheet Row Most Recent Value  SDOH Interventions   Physical  Activity Interventions Other (Comments)  [fall risk. has walking difficulty]  Stress Interventions Provide Counseling  [has stress in managing medical needs]        Care Coordination Interventions:  Yes, provided  Interventions Today    Flowsheet Row Most Recent Value  Chronic Disease   Chronic disease during today's visit Other  [spoke with client about client needs]  General Interventions   General Interventions Discussed/Reviewed General Interventions Discussed, Community Resources  Exercise Interventions   Exercise Discussed/Reviewed Physical Activity  Education Interventions   Education Provided Provided Education  Provided Verbal Education On Community Resources  Mental Health Interventions   Mental Health Discussed/Reviewed Coping Strategies  [no mood issues noted]  Nutrition Interventions   Nutrition Discussed/Reviewed Nutrition Discussed  Pharmacy Interventions   Pharmacy Dicussed/Reviewed Pharmacy Topics Discussed  Safety Interventions   Safety Discussed/Reviewed Fall Risk  [risk for falls]        Follow up plan: Follow up call scheduled for 10/25/23 at 3:30 PM    Encounter Outcome:  Patient Visit Completed   Kelton Pillar.Elonda Giuliano MSW, LCSW Licensed Visual merchandiser [facility] Care Management [phone]

## 2023-09-20 ENCOUNTER — Ambulatory Visit: Payer: Medicare Other

## 2023-09-22 ENCOUNTER — Ambulatory Visit (INDEPENDENT_AMBULATORY_CARE_PROVIDER_SITE_OTHER): Payer: Medicare Other | Admitting: Family Medicine

## 2023-09-22 VITALS — BP 118/68 | HR 82

## 2023-09-22 DIAGNOSIS — E538 Deficiency of other specified B group vitamins: Secondary | ICD-10-CM | POA: Diagnosis not present

## 2023-09-22 MED ORDER — CYANOCOBALAMIN 1000 MCG/ML IJ SOLN
1000.0000 ug | Freq: Once | INTRAMUSCULAR | Status: AC
  Administered 2023-09-22: 1000 ug via INTRAMUSCULAR

## 2023-09-22 NOTE — Progress Notes (Signed)
Injection was tolerated well by the patient. (See MAR for injection details)

## 2023-09-22 NOTE — Progress Notes (Signed)
Agree with documentation as above.   Teonna Coonan, MD  

## 2023-10-01 ENCOUNTER — Ambulatory Visit (HOSPITAL_BASED_OUTPATIENT_CLINIC_OR_DEPARTMENT_OTHER): Payer: Medicare Other | Attending: Family Medicine | Admitting: Internal Medicine

## 2023-10-01 DIAGNOSIS — R0683 Snoring: Secondary | ICD-10-CM | POA: Insufficient documentation

## 2023-10-01 DIAGNOSIS — G4733 Obstructive sleep apnea (adult) (pediatric): Secondary | ICD-10-CM | POA: Insufficient documentation

## 2023-10-09 DIAGNOSIS — G4733 Obstructive sleep apnea (adult) (pediatric): Secondary | ICD-10-CM

## 2023-10-09 DIAGNOSIS — R0683 Snoring: Secondary | ICD-10-CM

## 2023-10-09 NOTE — Procedures (Signed)
   Patient Name: Mia [name], Mia [name] Date: 10/04/2023 Gender: Female D.O.B: 07-28-1951 Age (years): 72 Referring Provider: Nani Gasser Height (inches): 66 Interpreting Physician: Jetty Duhamel MD, ABSM Weight (lbs): 176 RPSGT: Lamont Sink BMI: 28 MRN: [id number] Neck Size: 16.00  CLINICAL INFORMATION Sleep Study Type: HST Indication for sleep study: Hypertension, OSA Epworth Sleepiness Score: 3  Most recent polysomnogram dated 01/11/2022 revealed an AHI of 0.9/h and RDI of 3.1/h.  SLEEP STUDY TECHNIQUE A multi-channel overnight portable sleep study was performed. The channels recorded were: nasal airflow, thoracic respiratory movement, and oxygen saturation with a pulse oximetry. Snoring was also monitored.  MEDICATIONS Patient self administered medications include: MELATONIN, promethazine -DM.  SLEEP ARCHITECTURE Patient was studied for 364.9 minutes. The sleep efficiency was 100.0 % and the patient was supine for 0%. The arousal index was 0.0 per hour.  RESPIRATORY PARAMETERS The overall AHI was 57.2 per hour, with a central apnea index of 0 per hour. The oxygen nadir was 87% during sleep.  CARDIAC DATA Mean heart rate during sleep was 58.8 bpm.  IMPRESSIONS - Severe obstructive sleep apnea occurred during this study (AHI = 57.2/h). - Mild oxygen desaturation was noted during this study (Min O2 = 87%, Mean 93%). - Patient snored.  DIAGNOSIS - Obstructive Sleep Apnea (G47.33)  RECOMMENDATIONS - Suggest CPAP titration sleep study or autopap. Other options would be based on clinical judgment. - Be careful with alcohol, sedatives and other CNS depressants that may worsen sleep apnea and disrupt normal sleep architecture. - Sleep hygiene should be reviewed to assess factors that may improve sleep quality. - Weight management and regular exercise should be initiated or continued.  [Electronically signed] 10/09/2023 11:29 AM  Jetty Duhamel MD,  ABSM Diplomate, American Board of Sleep Medicine NPI: [id number]                          Jetty Duhamel Diplomate, American Board of Sleep Medicine  ELECTRONICALLY SIGNED ON:  10/09/2023, 11:26 AM [facility] PH: [phone]   FX: [phone] ACCREDITED BY THE AMERICAN ACADEMY OF SLEEP MEDICINE

## 2023-10-13 ENCOUNTER — Other Ambulatory Visit (HOSPITAL_COMMUNITY)
Admission: RE | Admit: 2023-10-13 | Discharge: 2023-10-13 | Disposition: A | Discharge status: Home / Self Care | Payer: Self-pay | Source: Ambulatory Visit | Attending: Oncology | Admitting: Oncology

## 2023-10-13 DIAGNOSIS — Z006 Encounter for examination for normal comparison and control in clinical research program: Secondary | ICD-10-CM | POA: Insufficient documentation

## 2023-10-14 ENCOUNTER — Encounter: Payer: Self-pay | Admitting: Family Medicine

## 2023-10-18 DIAGNOSIS — K580 Irritable bowel syndrome with diarrhea: Secondary | ICD-10-CM | POA: Diagnosis not present

## 2023-10-18 DIAGNOSIS — I82452 Acute embolism and thrombosis of left peroneal vein: Secondary | ICD-10-CM | POA: Diagnosis not present

## 2023-10-18 DIAGNOSIS — D509 Iron deficiency anemia, unspecified: Secondary | ICD-10-CM | POA: Diagnosis not present

## 2023-10-18 DIAGNOSIS — K219 Gastro-esophageal reflux disease without esophagitis: Secondary | ICD-10-CM | POA: Diagnosis not present

## 2023-10-21 ENCOUNTER — Ambulatory Visit (INDEPENDENT_AMBULATORY_CARE_PROVIDER_SITE_OTHER): Payer: Medicare Other

## 2023-10-21 DIAGNOSIS — E538 Deficiency of other specified B group vitamins: Secondary | ICD-10-CM | POA: Diagnosis not present

## 2023-10-21 MED ORDER — CYANOCOBALAMIN 1000 MCG/ML IJ SOLN
1000.0000 ug | Freq: Once | INTRAMUSCULAR | Status: AC
  Administered 2023-10-21: 1000 ug via INTRAMUSCULAR

## 2023-10-21 NOTE — Patient Instructions (Signed)
Return in 30 days for next B12 injection.  

## 2023-10-21 NOTE — Progress Notes (Signed)
   Established Patient Office Visit  Subjective   Patient ID: Mia [name], female    DOB: Jan 12, 1951  Age: 72 y.o. MRN: [id number]  Chief Complaint  Patient presents with   B12 deficiency    B12 injection nurse visit.     HPI  B12 inejction nurse visit. Pateint denies irregular HR, GI problems or weakness.   ROS    Objective:     There were no vitals taken for this visit.   Physical Exam   No results found for any visits on 10/21/23.    The 10-year ASCVD risk score (Arnett DK, et al., 2019) is: 23.7%    Assessment & Plan:  B12 injection nurse visit. Admin 1,063mcg IM RUOQ . Patient tolerated injection well without complications. Patient will return in  30 days for next B12 injection.  Problem List Items Addressed This Visit   None   No follow-ups on file.    Elizabeth Palau, LPN

## 2023-10-25 ENCOUNTER — Ambulatory Visit: Payer: Self-pay | Admitting: Licensed Clinical Social Worker

## 2023-10-25 LAB — GENECONNECT MOLECULAR SCREEN: Genetic Analysis Overall Interpretation: NEGATIVE

## 2023-10-25 NOTE — Patient Outreach (Signed)
  Care Coordination   Follow Up Visit Note   10/25/2023 Name: Mia [name] MRN: [id number] DOB: 02/24/1951  Mia [name] is a 72 y.o. year old female who sees Mia [name], Mia Ehlers, MD for primary care. I spoke with  Mia [name] by phone today.  What matters to the patients health and wellness today?  Patient Stated she has periodic falls. No serious injuries. She gets dizzy occasionally. Left knee pain    Goals Addressed             This Visit's Progress    Patient Stated she has periodic falls. No serious injuries. She gets dizzy occasionally. Left knee pain       Interventions: Spoke with client via phone today about client needs Discussed medication procurement of client Discussed walking challenges. She has periodic falls. She has had no serious injury from fall. She said she does have cane, walker, wheelchair but at present is not using a device to help her walk She sees PCP for support. PCP is Dr. Nani [name]. Discussed pain issues. She has knee pain occasionally. Client has back pain issue Used Active Listening skills to hear client needs Discussed program support with RN, LCSW, Pharmacist Discussed vision issues. She said she sometimes has blurry vision ; she does see eye specialist for support Client said she has appointment scheduled with neurologist in January of 2025 Thanked client for phone call today with LCSW Encouraged client or spouse to call LCSW as needed for SW support for client.at [phone]         SDOH assessments and interventions completed:  Yes  SDOH Interventions Today    Flowsheet Row Most Recent Value  SDOH Interventions   Physical Activity Interventions Other (Comments)  [does have knee pain issues. Fall risk]  Stress Interventions Other (Comment)  [has stress in managing medical needs]        Care Coordination Interventions:  Yes, provided   Interventions Today    Flowsheet Row Most Recent Value  Chronic Disease    Chronic disease during today's visit Other  [spoke with client about client needs]  General Interventions   General Interventions Discussed/Reviewed General Interventions Discussed, Community Resources  Education Interventions   Education Provided Provided Education  Provided Engineer, [employer]  Mental Health Interventions   Mental Health Discussed/Reviewed Coping Strategies  [no mood issues noted]  Nutrition Interventions   Nutrition Discussed/Reviewed Nutrition Discussed  Pharmacy Interventions   Pharmacy Dicussed/Reviewed Pharmacy Topics Discussed  Safety Interventions   Safety Discussed/Reviewed Fall Risk        Follow up plan: Follow up call scheduled for 12/27/23 at 3:00 PM     Encounter Outcome:  Patient Visit Completed   Mia [name].Mia [name] MSW, LCSW Licensed Visual merchandiser [facility] Care Management [phone]

## 2023-10-25 NOTE — Patient Instructions (Signed)
Visit Information  Thank you for taking time to visit with me today. Please don't hesitate to contact me if I can be of assistance to you.   Following are the goals we discussed today:   Goals Addressed             This Visit's Progress    Patient Stated she has periodic falls. No serious injuries. She gets dizzy occasionally. Left knee pain       Interventions: Spoke with client via phone today about client needs Discussed medication procurement of client Discussed walking challenges. She has periodic falls. She has had no serious injury from fall. She said she does have cane, walker, wheelchair but at present is not using a device to help her walk She sees PCP for support. PCP is Dr. Nani Gasser. Discussed pain issues. She has knee pain occasionally. Client has back pain issue Used Active Listening skills to hear client needs Discussed program support with RN, LCSW, Pharmacist Discussed vision issues. She said she sometimes has blurry vision ; she does see eye specialist for support Client said she has appointment scheduled with neurologist in January of 2025 Thanked client for phone call today with LCSW Encouraged client or spouse to call LCSW as needed for SW support for client.at [phone]         Our next appointment is by telephone on 12/27/23 at 3:00 PM   Please call the care guide team at [phone] if you need to cancel or reschedule your appointment.   If you are experiencing a Mental Health or Behavioral Health Crisis or need someone to talk to, please go to [facility] [address] [place] [phone])   The patient verbalized understanding of instructions, educational materials, and care plan provided today and DECLINED offer to receive copy of patient instructions, educational materials, and care plan.   The patient has been provided with contact information for the care management team and has been advised  to call with any health related questions or concerns.   Kelton Pillar.Ferne Ellingwood MSW, LCSW Licensed Visual merchandiser [facility] [phone]

## 2023-10-31 NOTE — Progress Notes (Signed)
 PATIENT: Mia [name] DOB: Jan 05, 1951  REASON FOR VISIT: follow up HISTORY FROM: patient  Chief Complaint  Patient presents with   Follow-up    [name], here with husband [name] is here for a follow up. Pt states she has been very off balance. Pt states she stumbles a lot.      HISTORY OF PRESENT ILLNESS: Today 10/31/23:  Mia [name] is a 73 y.o. female with a history of remor, fibromyalgia and memory disturbance. Returns today for follow-up.  Overall she is doing relatively well.  She states that she did go to [facility] and saw ENT and they did testing and told her she had a central processing issue that is affecting her balance.  She is asking that the results be sent to her office.  She was told that she should see neurology.  States that she stumbles quite frequently.  Has only had 1 recent fall.  Fortunately no significant injuries other than bruising.  She continues to have trouble remembering things that she has been told.  Reports that sometimes she cannot remember if she takes her medications.  Or sometimes she will take her evening medications instead of the morning medications.  She does have a pillbox.  Her husband is there to assist her if needed.  Husband reports that she does not typically drive but does on occasion drive alone.  Patient reports no trouble getting lost while driving.     Blood clot- DVT (August 2024). Off blood thinners today   04/27/23: (copied from Dr. Obie note) 04/27/2023: She reports feeling off balance for at least a year.  She has fallen several times.  She does not typically use a walker but has 2 types of walkers, she has a rolling walker with seat and also a 2 wheeled walker.  She has fallen at night, she is also fallen out of bed.  She now has a bed rail.  She was advised at the last visit in our clinic to use a walker but has not consistently used a walker, typically does not use any walking aid.  She has significant knee issues, including  meniscus issues on the left, multiple knee surgeries altogether, needs low back surgery and is actually scheduled for lumbar spine surgery in August 2024 with Dr. Rockey Peru.   She has been on Mysoline  for tremor particularly head tremor.  She has been on gabapentin  which is currently 300 mg 3 times daily.  She reports that she was on 1200 mg before.  She reports hydrating well.  She drinks about 4-5 tall glasses of water per day.  She drinks 1 cup of coffee in the morning, no alcohol.    She saw her primary care on 02/24/2023 and I reviewed the office note.  She was complaining of dizziness at the time.  She reported an episode of chills in the middle of the night in late April and felt confused the next day.  She felt like she was slurring her speech.  A brain MRI was ordered.  She had a brain MRI with and without contrast on 02/28/2023 and I reviewed the results:   IMPRESSION: 1.  No evidence of an acute intracranial abnormality. 2. Minimal chronic small vessel ischemic changes within the cerebral white matter, similar to the prior brain MRI of 09/01/2020.   She had a prior brain MRI with and without contrast on 09/01/2020 and I reviewed the results:  IMPRESSION: This MRI of the  brain with and without contrast shows the following: 1.   Minimal generalized cortical atrophy, typical for age but slightly progressed compared to the 2016 MRI 2.   Few punctate T2/FLAIR hyperintense foci the subcortical white matter.  This is a nonspecific finding but most likely represents minimal chronic microvascular ischemic change, typical for age. 3.   There is a normal enhancement pattern and no acute findings.     She has been receiving vitamin B12 injections.  Last injection was on 03/01/2023.   Recent laboratory testing from 02/24/2023 was reviewed.  CBC with differential showed mildly elevated platelets at 466 and elevated monocytes.  CMP showed low glucose at 61, BUN 12, creatinine 0.8, chloride 97, borderline  below normal.  AST elevated at 36 which is borderline high, ALT mildly high at 52.  Vitamin B12 level 439.  TSH was normal at 1.3.   Of note, she is on multiple medications including potentially sedating medications.  She is currently on primidone  50 mg at bedtime for tremor, she is on Effexor  long-acting 75 mg daily, she takes a beta-blocker per cardiology.  She is on Namenda  generic 10 mg twice daily as well as Aricept  generic 10 mg once daily.  She is on gabapentin  300 mg strength up to 600 mg twice daily.   12/22/22: Mia [name] is a 73 y.o. female with a history of tremor, fibromyalgia and memory disturbance. Returns today for follow-up.    Tremor: Currently taking Primidone  50 mg at bedtime. Has good days and bad days. Definitely has more good days than bad days.   Fibromyalgia: Currently on Effexor  75 mg daily. Has a lot of tenderness in the legs. Was on Lyrica  for a while and it worked great but then she couldn't tolerate it. Cymbalta increased her blood pressure. She does see Dr. Dolphus who treats her RA.   Memory: Currently on Aricept  10 mg at bedtime and namenda  10 mg BID. Feels that she has good days and bad days. Has trouble with short term things and some long term memory. . Able to complete all ADLs independently. Needs some help with household chores but this is due her back. She does write notes in order to remember.  Manages her medications, appointments, and finances.   Has been having trouble with HTN- PCP is adjusting medication.  Continues to have back pain but this is currently being managed by [facility]   05/14/22: [name] is a 73 year old female with a history of tremor, fibromyalgia and memory disturbance. She returns today for follow-up.    Tremor: Currently taking Primidone  50 mg at bedtime. Seems to be working well. Termor well controlled.    Fibromyalgia: Currently on Effexor  75 mg daily. Has a lot of tenderness but feels effexor  is helpful    Memory:  Currently on Aricept  10 mg at bedtime and namenda  10 mg BID. Feels that her memory is worse. Has trouble with short term things. Reports that she forgets what people has told her. Able to complete all ADLs independently. She does write notes in order to remember. May forget to take clothes out of the washer.    Reports that she is having trouble with her balance. Has had several falls. Fortunately no significant injuries. Reports that her BP gets higher in the afternoon and evenings. SBP <180. Going to cardiology and they are aware. Also reports they are monitoring for A.fib.    11/13/2021 Ms. Sizelove is a 73 year old female with a history of memory  disturbance, tremor and fibromyalgia.  She returns today for follow-up.  Tremor: Reports it is stable.  Continues on primidone  50 mg at bedtime.  Husband reports that he has not noticed a tremor in the neck like he used to.  Fibromyalgia: Stable on Effexor .  Memory disturbance: Reports that she sometimes has trouble remembering dates has to use a calendar.  Did not remember a wedding that she was in 18 years ago.  She lives at home with her husband.  Able to complete all ADLs independently.  She manages her own medications and appointments.  Denies any trouble sleeping.  Reports good appetite.  Does report that she will have back surgery January 31 for ruptured disc.  03/04/21: Ms. Hachey is a 73 year old female with a history of memory disturbance, tremor and fibromyalgia.  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  The patient reports today that years ago when she was at [facility] this was also mentioned as a diagnosis.  She has had normal nerve conduction with EMGs in the past.  In regards to her tremor she feels that Mysoline  continues to work fairly well for her.    Fibromyalgia: Continues on Effexor  feels that this works well for  her.  In regards to her memory she feels that it has gotten worse since having Covid in January.  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  She is able to complete all ADLs independently.  She has noticed some change in her balance.  States that she recently had a fall and injured her elbow. HISTORY:  Feels that her tremor has been well controlled with Mysoline .  Primarily just affects the hands  Reports that fibromyalgia continues to be fairly controlled with Effexor .  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  She returns today for an evaluation.  HISTORY Ms. Leverett is a 72 year old right-handed white female with a history of a mild memory disturbance, she is on Aricept  and Namenda .  She contracted the Covid virus and required hospitalization on 06 November 2019.  The patient has had some increase in fatigue and some increased cognitive slowing since that time.  She has had ongoing pain in the neck and low back, she is followed through an orthopedic surgeon.  She had a recent MRI of the cervical and lumbar spine done through [facility] in [place].  The results of the studies are not available to me.  The patient does report some gait instability, she has not had any recent falls.  She does not use a cane.  She returns the office today for an evaluation.  She is on very low-dose Mysoline  taking 50 mg at night.  REVIEW OF SYSTEMS: Out of a complete 14 system review of symptoms, the patient complains only of the following symptoms, and all other reviewed systems are negative.  See HPI  ALLERGIES: Allergies  Allergen Reactions   Codeine Nausea And Vomiting    Other Reaction(s): UNKNOWN   Myrbetriq  [Mirabegron] Other (See Comments)    SEVERE HEADACHE   Nystatin Other (See Comments)    Unknown reaction   Percocet Baha.blonder ] Nausea And Vomiting   Propoxyphene Nausea And Vomiting    Other  Reaction(s): UNKNOWN   Erythromycin Base     Other Reaction(s): UNKNOWN   Ketorolac   Other Reaction(s): UNKNOWN   Oxycodone-Acetaminophen  Nausea Only   Celecoxib Other (See Comments)    Headache/flushed     Darvocet [Propoxyphene N-Acetaminophen ] Nausea And Vomiting   Erythromycin Nausea And Vomiting    Nausea and vomiting    Hydrocodone  Nausea And Vomiting   Nitrofurantoin Nausea And Vomiting    Other Reaction(s): UNKNOWN   Pregabalin  Swelling    Legs swelling     Toradol  [Ketorolac  Tromethamine ] Nausea And Vomiting    Per patient, only PO form causes nausea and vomiting. Can take injection without issue   Tramadol Nausea And Vomiting   Verapamil Nausea And Vomiting    HOME MEDICATIONS: Outpatient Medications Prior to Visit  Medication Sig Dispense Refill   acetaminophen  (TYLENOL ) 500 MG tablet Take 2,000 mg by mouth every 6 (six) hours as needed (pain.).     albuterol  (VENTOLIN  HFA) 108 (90 Base) MCG/ACT inhaler Inhale 1-2 puffs into the lungs every 6 (six) hours as needed for shortness of breath or wheezing.     amLODipine  (NORVASC ) 5 MG tablet Take 1 tablet (5 mg total) by mouth daily. (Patient taking differently: Take 5 mg by mouth daily at 6 PM. (1700)) 90 tablet 3   apixaban  (ELIQUIS ) 5 MG TABS tablet Take 1 tablet (5 mg total) by mouth 2 (two) times daily. 180 tablet 3   cetirizine (ZYRTEC) 10 MG tablet Take 10 mg by mouth in the morning.     colestipol  (COLESTID ) 1 g tablet Take 1 g by mouth daily at 12 noon. (1100)     cyanocobalamin  (VITAMIN B12) 1000 MCG/ML injection Inject 1,000 mcg into the muscle every 28 (twenty-eight) days.     donepezil  (ARICEPT ) 10 MG tablet TAKE 1 TABLET IN THE EVENING AT BEDTIME 90 tablet 3   gabapentin  (NEURONTIN ) 300 MG capsule Take 1 capsule (300 mg total) by mouth in the morning and at bedtime. 180 capsule 1   ipratropium (ATROVENT ) 0.06 % nasal spray Place 2 sprays into both nostrils 2 (two) times daily as needed for rhinitis.      leflunomide  (ARAVA ) 20 MG tablet TAKE 1 TABLET BY MOUTH EVERY DAY 90 tablet 0   Melatonin 10 MG CAPS Take 10 mg by mouth at bedtime.     memantine  (NAMENDA ) 10 MG tablet TAKE 1 TABLET BY MOUTH TWO TIMES A DAY **MUST SEE DR FOR FURTHER REFILLS** 180 tablet 3   metoprolol  tartrate (LOPRESSOR ) 100 MG tablet Take 1 tablet (100 mg total) by mouth 2 (two) times daily. 180 tablet 1   ondansetron  (ZOFRAN -ODT) 8 MG disintegrating tablet Take 8 mg by mouth every 8 (eight) hours as needed for nausea or vomiting.     ONETOUCH ULTRA test strip ULTRA 2. CHECK BLOOD GLUCOSE 3 TIMES DAILY. 100 strip 12   pantoprazole  (PROTONIX ) 20 MG tablet Take 20 mg by mouth daily before breakfast.     primidone  (MYSOLINE ) 50 MG tablet TAKE 1 TABLET BY MOUTH EVERYDAY AT BEDTIME 90 tablet 3   Probiotic Product (ALIGN PO) Take 1 capsule by mouth in the morning.     sodium chloride  (MURO 128) 5 % ophthalmic solution Place 1 drop into both eyes in the morning, at noon, in the evening, and at bedtime.     valsartan  (DIOVAN ) 160 MG tablet TAKE 1 TABLET BY MOUTH 2 TIMES DAILY. 180 tablet 3   venlafaxine  XR (EFFEXOR -XR) 75 MG 24 hr capsule TAKE 1 CAPSULE DAILY WITH BREAKFAST 90 capsule 3   Vitamin D , Ergocalciferol , (DRISDOL ) 1.25 MG (50000 UNIT)  CAPS capsule Take 1 capsule (50,000 Units total) by mouth every 7 (seven) days. 12 capsule 1   zoledronic  acid (RECLAST ) 5 MG/100ML SOLN injection Inject 5 mg into the vein as directed. Received infusion once yearly     No facility-administered medications prior to visit.    PAST MEDICAL HISTORY: Past Medical History:  Diagnosis Date   Anal fissure    Anemia    Arthritis    Blood transfusion    C. difficile colitis    Chest pain    a. 01/2013 MV: EF 59%, no ischemia.   Chronic Dyspnea    a. 01/2013 Echo: EF 60-65%, Gr 1 DD, PASP 57mmHg.// Echo 01/2020: EF 60-65, no RWMA, GR 1 DD, GLS -21.6%, normal RV SF, trivial MR    COVID-19    DDD (degenerative disc disease), cervical    DDD  (degenerative disc disease), lumbar    Diabetes mellitus    DVT (deep venous thrombosis) (HCC) 06/23/2023   left lower extremity   Fibromyalgia    Gall stones    GERD (gastroesophageal reflux disease)    gastritis   Hemorrhoids    Hypertension    Interstitial cystitis    MCI (mild cognitive impairment) 11/25/2017   Memory difficulty 12/14/2013   Neuromuscular disorder (HCC)    sclerosis   Osteoporosis    Peptic ulcer    PONV (postoperative nausea and vomiting)    Pseudogout    Raynaud phenomenon    Rectal bleeding    Sleep apnea    a. on cpap.   Stress fracture of left tibia 11/05/2016   SVT (supraventricular tachycardia) (HCC)    Syncope 09/10/2015   Thyroid  disease    hypothyroidism   Tremor, essential 05/14/2016    PAST SURGICAL HISTORY: Past Surgical History:  Procedure Laterality Date   APPENDECTOMY     BLADDER SURGERY     x2   BLADDER SURGERY     BREAST BIOPSY     CARDIAC CATHETERIZATION     CARPAL TUNNEL RELEASE     CATARACT EXTRACTION Bilateral    CHOLECYSTECTOMY     DILATION AND CURETTAGE OF UTERUS     ENTEROCELE REPAIR     x2   EYE SURGERY     retina   herniated disc  11/25/2021   L4 and L5   hysterectomy - unknown type     JOINT REPLACEMENT     KNEE ARTHROPLASTY     KNEE SURGERY     x6   NECK SURGERY     fusion   OOPHORECTOMY     QUADRICEPS REPAIR Right    RECTOCELE REPAIR     x2   SHOULDER ARTHROSCOPY WITH SUBACROMIAL DECOMPRESSION, ROTATOR CUFF REPAIR AND BICEP TENDON REPAIR  10/06/2012   Procedure: SHOULDER ARTHROSCOPY WITH SUBACROMIAL DECOMPRESSION, ROTATOR CUFF REPAIR AND BICEP TENDON REPAIR;  Surgeon: Eva Elsie Herring, MD;  Location: [facility];  Service: Orthopedics;  Laterality: Right;  Arthroscopic  Repair  of  Subscapularis, Open Biceps Tenodesis   SHOULDER SURGERY     bilateral- bones spur   SHOULDER SURGERY Left 04/15/2021   TONSILLECTOMY     TOTAL KNEE ARTHROPLASTY     TOTAL SHOULDER ARTHROPLASTY       FAMILY HISTORY: Family History  Problem Relation Age of Onset   Heart attack Mother    Stroke Mother    Diabetes Mother    Heart disease Mother    Colon polyps Mother    Asthma Mother  Dementia Mother    Alzheimer's disease Mother    Heart disease Father    Heart attack Father    Asthma Father    Stroke Sister    Hypertension Sister    Rheum arthritis Sister    Dementia Sister    Asthma Sister    Parkinson's disease Sister    Lupus Sister    Asthma Sister    Breast cancer Maternal Grandmother    Wilson's disease Maternal Grandmother    Pancreatic cancer Maternal Grandfather    Autoimmune disease Daughter        in eyes   Arthritis Daughter    Deep vein thrombosis Daughter     SOCIAL HISTORY: Social History   Socioeconomic History   Marital status: Married    Spouse name: Norleen Kemps   Number of children: 2   Years of education: 14   Highest education level: Associate degree: academic program  Occupational History   Occupation: Retired  Tobacco Use   Smoking status: Never    Passive exposure: Never   Smokeless tobacco: Never  Vaping Use   Vaping status: Never Used  Substance and Sexual Activity   Alcohol use: No    Alcohol/week: 0.0 standard drinks of alcohol   Drug use: No   Sexual activity: Not on file  Other Topics Concern   Not on file  Social History Narrative   Lives with her husband. Her grand daughter is living with them.  She likes to hang out with her friends and play games and do bible study.   Social Drivers of Corporate Investment Banker Strain: Low Risk  (05/25/2023)   Overall Financial Resource Strain (CARDIA)    Difficulty of Paying Living Expenses: Not hard at all  Food Insecurity: No Food Insecurity (05/25/2023)   Hunger Vital Sign    Worried About Running Out of Food in the Last Year: Never true    Ran Out of Food in the Last Year: Never true  Transportation Needs: No Transportation Needs (05/25/2023)   PRAPARE -  Administrator, Civil Service (Medical): No    Lack of Transportation (Non-Medical): No  Physical Activity: Inactive (10/25/2023)   Exercise Vital Sign    Days of Exercise per Week: 0 days    Minutes of Exercise per Session: 0 min  Stress: Stress Concern Present (10/25/2023)   [employer] of Occupational Health - Occupational Stress Questionnaire    Feeling of Stress : To some extent  Social Connections: Socially Integrated (05/25/2023)   Social Connection and Isolation Panel [NHANES]    Frequency of Communication with Friends and Family: More than three times a week    Frequency of Social Gatherings with Friends and Family: More than three times a week    Attends Religious Services: More than 4 times per year    Active Member of [name] or Organizations: Yes    Attends Engineer, Structural: More than 4 times per year    Marital Status: Married  Catering Manager Violence: Not At Risk (05/25/2023)   Humiliation, Afraid, Rape, and Kick questionnaire    Fear of Current or Ex-Partner: No    Emotionally Abused: No    Physically Abused: No    Sexually Abused: No      PHYSICAL EXAM  Vitals:   11/03/23 1028  BP: 122/70  Pulse: 80  Weight: 177 lb (80.3 kg)  Height: 5' 6 (1.676 m)     Body mass index is 28.57 kg/m.  11/03/2023   11:19 AM 12/22/2022    9:01 AM 11/13/2021    2:01 PM  MMSE - Mini Mental State Exam  Orientation to time 5 4 5   Orientation to Place 5 5 5   Registration 3 3 3   Attention/ Calculation 5 1 4   Recall 2 3 2   Language- name 2 objects 2 2 2   Language- repeat 1 1 1   Language- follow 3 step command 3 3 3   Language- read & follow direction 1 1 1   Write a sentence 1 1 1   Copy design 0 0 1  Total score 28 24 28      Generalized: Well developed, in no acute distress   Neurological examination  Mentation: Alert oriented to time, place, history taking. Follows all commands speech and language fluent Cranial nerve II-XII: Pupils  were equal round reactive to light. Extraocular movements were full, visual field were full on confrontational test.  Dizziness reported with eye movements.  Facial sensation and strength were normal.  Head turning and shoulder shrug  were normal and symmetric. Motor: The motor testing reveals 5 over 5 strength.  Patient has giveaway weakness noted in the left upper extremity.  Good symmetric motor tone is noted throughout.  Sensory: Sensory testing is intact to soft touch on all 4 extremities. No evidence of extinction is noted.  Increased sensitivity in the lower extremities due to fibromyalgia Coordination: Cerebellar testing reveals good finger-nose-finger and heel-to-shin bilaterally.  Gait and station: Gait is unsteady.   Tandem gait no attempted.    DIAGNOSTIC DATA (LABS, IMAGING, TESTING) - I reviewed patient records, labs, notes, testing and imaging myself where available.  Lab Results  Component Value Date   WBC 5.0 08/04/2023   HGB 10.6 (L) 08/04/2023   HCT 33.0 (L) 08/04/2023   MCV 85.7 08/04/2023   PLT 338.0 08/04/2023      Component Value Date/Time   NA 136 08/26/2023 1103   NA 138 01/23/2020 1602   K 4.3 08/26/2023 1103   CL 102 08/26/2023 1103   CO2 28 08/26/2023 1103   GLUCOSE 118 (H) 08/26/2023 1103   BUN 9 08/26/2023 1103   BUN 9 01/23/2020 1602   CREATININE 0.69 08/26/2023 1103   CALCIUM  9.7 08/26/2023 1103   PROT 6.8 08/26/2023 1103   PROT 5.9 (L) 06/07/2019 0934   ALBUMIN  4.0 08/04/2023 1120   ALBUMIN  3.9 06/07/2019 0934   AST 53 (H) 08/26/2023 1103   ALT 44 (H) 08/26/2023 1103   ALKPHOS 137 (H) 09/19/2022 0041   BILITOT 0.4 08/26/2023 1103   BILITOT 0.3 06/07/2019 0934   GFRNONAA >60 05/31/2023 2017   GFRNONAA 81 03/10/2021 1008   GFRAA 94 03/10/2021 1008   Lab Results  Component Value Date   CHOL 215 (H) 08/01/2021   HDL 68 08/01/2021   LDLCALC 123 (H) 08/01/2021   TRIG 125 08/01/2021   CHOLHDL 3.2 08/01/2021   Lab Results  Component Value  Date   HGBA1C 5.5 08/23/2023   Lab Results  Component Value Date   VITAMINB12 >2000 (H) 08/23/2023   Lab Results  Component Value Date   TSH 1.30 02/24/2023      ASSESSMENT AND PLAN 73 y.o. year old female  has a past medical history of Anal fissure, Anemia, Arthritis, Blood transfusion, C. difficile colitis, Chest pain, Chronic Dyspnea, COVID-19, DDD (degenerative disc disease), cervical, DDD (degenerative disc disease), lumbar, Diabetes mellitus, DVT (deep venous thrombosis) (HCC) (06/23/2023), Fibromyalgia, Gall stones, GERD (gastroesophageal reflux disease), Hemorrhoids, Hypertension, Interstitial cystitis,  MCI (mild cognitive impairment) (11/25/2017), Memory difficulty (12/14/2013), Neuromuscular disorder (HCC), Osteoporosis, Peptic ulcer, PONV (postoperative nausea and vomiting), Pseudogout, Raynaud phenomenon, Rectal bleeding, Sleep apnea, Stress fracture of left tibia (11/05/2016), SVT (supraventricular tachycardia) (HCC), Syncope (09/10/2015), Thyroid  disease, and Tremor, essential (05/14/2016). here with :  1. Tremor  - Stable - Continue Mysoline  50 mg daily at bedtime  2.  Fibromyalgia   -Continue Effexor  75 mg daily  3.  Memory disturbance  - MMSE 28/30 previously 24/30 -Continue Namenda  10 mg twice a day -Continue Aricept  10 mg at bedtime  4. Dizziness/ balance disturbance  - Requesting records from ENT     FU in 6-8 months or sooner if needed  Duwaine Russell, MSN, NP-C 10/31/2023, 6:53 PM [facility] [address] [place], [place] [address] [phone]

## 2023-11-03 ENCOUNTER — Encounter: Payer: Self-pay | Admitting: Adult Health

## 2023-11-03 ENCOUNTER — Ambulatory Visit: Payer: Medicare Other | Admitting: Adult Health

## 2023-11-03 VITALS — BP 122/70 | HR 80 | Ht 66.0 in | Wt 177.0 lb

## 2023-11-03 DIAGNOSIS — R2689 Other abnormalities of gait and mobility: Secondary | ICD-10-CM | POA: Diagnosis not present

## 2023-11-03 DIAGNOSIS — R413 Other amnesia: Secondary | ICD-10-CM | POA: Diagnosis not present

## 2023-11-03 DIAGNOSIS — G250 Essential tremor: Secondary | ICD-10-CM | POA: Diagnosis not present

## 2023-11-03 DIAGNOSIS — M797 Fibromyalgia: Secondary | ICD-10-CM | POA: Diagnosis not present

## 2023-11-04 ENCOUNTER — Encounter: Payer: MEDICARE | Attending: Radiation Oncology | Primary: Internal Medicine

## 2023-11-05 DIAGNOSIS — M5416 Radiculopathy, lumbar region: Secondary | ICD-10-CM | POA: Diagnosis not present

## 2023-11-05 DIAGNOSIS — M545 Low back pain, unspecified: Secondary | ICD-10-CM | POA: Diagnosis not present

## 2023-11-10 ENCOUNTER — Ambulatory Visit: Payer: Medicare Other | Attending: Internal Medicine | Admitting: Internal Medicine

## 2023-11-10 ENCOUNTER — Other Ambulatory Visit: Payer: Self-pay | Admitting: Physician Assistant

## 2023-11-10 VITALS — BP 130/78 | HR 60 | Resp 16 | Ht 66.0 in | Wt 174.0 lb

## 2023-11-10 DIAGNOSIS — I471 Supraventricular tachycardia, unspecified: Secondary | ICD-10-CM | POA: Diagnosis not present

## 2023-11-10 NOTE — Telephone Encounter (Signed)
 Last Fill: 08/12/2023  Labs: 08/26/2023 ESR and CRP WNL AST and ALT are elevated. 08/04/2023 RBC 3.85, Hgb 10.6, Hct 33.0, RDW 17.5  Next Visit: 02/02/2024  Last Visit: 08/26/2023  DX: Seronegative rheumatoid arthritis   Current Dose per office note 08/26/2023: Arava  20 mg 1 tablet by mouth daily.   Okay to refill Arava  ?

## 2023-11-10 NOTE — Progress Notes (Signed)
 HPI [name] returns today for followup.She is a pleasant 73 yo woman with HTN and SVT who has remained with medical therapy over the years. Her bp has been fairly labile, occasionally as high as 200 systolic. She has had minutes of palpitations. She has a cardia mobile app and she has had ST in the low 100's. She wore a zio monitor and had no arrhythmias.  Allergies  Allergen Reactions   Codeine Nausea And Vomiting    Other Reaction(s): UNKNOWN   Myrbetriq  [Mirabegron] Other (See Comments)    SEVERE HEADACHE   Nystatin Other (See Comments)    Unknown reaction   Percocet [Oxycodone-Acetaminophen ] Nausea And Vomiting   Propoxyphene Nausea And Vomiting    Other Reaction(s): UNKNOWN   Erythromycin Base     Other Reaction(s): UNKNOWN   Ketorolac      Other Reaction(s): UNKNOWN   Oxycodone-Acetaminophen  Nausea Only   Celecoxib Other (See Comments)    Headache/flushed     Darvocet [Propoxyphene N-Acetaminophen ] Nausea And Vomiting   Erythromycin Nausea And Vomiting    Nausea and vomiting    Hydrocodone  Nausea And Vomiting   Nitrofurantoin Nausea And Vomiting    Other Reaction(s): UNKNOWN   Pregabalin  Swelling    Legs swelling     Toradol  [Ketorolac  Tromethamine ] Nausea And Vomiting    Per patient, only PO form causes nausea and vomiting. Can take injection without issue   Tramadol Nausea And Vomiting   Verapamil Nausea And Vomiting     Current Outpatient Medications  Medication Sig Dispense Refill   acetaminophen  (TYLENOL ) 500 MG tablet Take 2,000 mg by mouth every 6 (six) hours as needed (pain.).     albuterol  (VENTOLIN  HFA) 108 (90 Base) MCG/ACT inhaler Inhale 1-2 puffs into the lungs every 6 (six) hours as needed for shortness of breath or wheezing.     amLODipine  (NORVASC ) 5 MG tablet Take 1 tablet (5 mg total) by mouth daily. (Patient taking differently: Take 5 mg by mouth daily at 6 PM. (1700)) 90 tablet 3   cetirizine (ZYRTEC) 10 MG tablet Take 10 mg by mouth  in the morning.     colestipol (COLESTID) 1 g tablet Take 1 g by mouth daily at 12 noon. (1100)     cyanocobalamin  (VITAMIN B12) 1000 MCG/ML injection Inject 1,000 mcg into the muscle every 28 (twenty-eight) days.     donepezil  (ARICEPT ) 10 MG tablet TAKE 1 TABLET IN THE EVENING AT BEDTIME 90 tablet 3   gabapentin  (NEURONTIN ) 300 MG capsule Take 1 capsule (300 mg total) by mouth in the morning and at bedtime. 180 capsule 1   ipratropium (ATROVENT ) 0.06 % nasal spray Place 2 sprays into both nostrils 2 (two) times daily as needed for rhinitis.     Melatonin 10 MG CAPS Take 10 mg by mouth at bedtime.     memantine  (NAMENDA ) 10 MG tablet TAKE 1 TABLET BY MOUTH TWO TIMES A DAY **MUST SEE DR FOR FURTHER REFILLS** 180 tablet 3   metoprolol  tartrate (LOPRESSOR ) 100 MG tablet Take 1 tablet (100 mg total) by mouth 2 (two) times daily. 180 tablet 1   ondansetron  (ZOFRAN -ODT) 8 MG disintegrating tablet Take 8 mg by mouth every 8 (eight) hours as needed for nausea or vomiting.     pantoprazole  (PROTONIX ) 20 MG tablet Take 20 mg by mouth daily before breakfast.     primidone  (MYSOLINE ) 50 MG tablet TAKE 1 TABLET BY MOUTH EVERYDAY AT BEDTIME 90 tablet 3  Probiotic Product (ALIGN PO) Take 1 capsule by mouth in the morning.     sodium chloride  (MURO 128) 5 % ophthalmic solution Place 1 drop into both eyes in the morning, at noon, in the evening, and at bedtime.     valsartan  (DIOVAN ) 160 MG tablet TAKE 1 TABLET BY MOUTH 2 TIMES DAILY. 180 tablet 3   venlafaxine  XR (EFFEXOR -XR) 75 MG 24 hr capsule TAKE 1 CAPSULE DAILY WITH BREAKFAST 90 capsule 3   Vitamin D , Ergocalciferol , (DRISDOL ) 1.25 MG (50000 UNIT) CAPS capsule Take 1 capsule (50,000 Units total) by mouth every 7 (seven) days. 12 capsule 1   leflunomide  (ARAVA ) 20 MG tablet TAKE 1 TABLET BY MOUTH EVERY DAY 90 tablet 0   No current facility-administered medications for this visit.     Past Medical History:  Diagnosis Date   Anal fissure    Anemia     Arthritis    Blood transfusion    C. difficile colitis    Chest pain    a. 01/2013 MV: EF 59%, no ischemia.   Chronic Dyspnea    a. 01/2013 Echo: EF 60-65%, Gr 1 DD, PASP 39mmHg.// Echo 01/2020: EF 60-65, no RWMA, GR 1 DD, GLS -21.6%, normal RV SF, trivial MR    COVID-19    DDD (degenerative disc disease), cervical    DDD (degenerative disc disease), lumbar    Diabetes mellitus    DVT (deep venous thrombosis) (HCC) 06/23/2023   left lower extremity   Fibromyalgia    Gall stones    GERD (gastroesophageal reflux disease)    gastritis   Hemorrhoids    Hypertension    Interstitial cystitis    MCI (mild cognitive impairment) 11/25/2017   Memory difficulty 12/14/2013   Neuromuscular disorder (HCC)    sclerosis   Osteoporosis    Peptic ulcer    PONV (postoperative nausea and vomiting)    Pseudogout    Raynaud phenomenon    Rectal bleeding    Sleep apnea    a. on cpap.   Stress fracture of left tibia 11/05/2016   SVT (supraventricular tachycardia) (HCC)    Syncope 09/10/2015   Thyroid  disease    hypothyroidism   Tremor, essential 05/14/2016    ROS:   All systems reviewed and negative except as noted in the HPI.   Past Surgical History:  Procedure Laterality Date   APPENDECTOMY     BLADDER SURGERY     x2   BLADDER SURGERY     BREAST BIOPSY     CARDIAC CATHETERIZATION     CARPAL TUNNEL RELEASE     CATARACT EXTRACTION Bilateral    CHOLECYSTECTOMY     DILATION AND CURETTAGE OF UTERUS     ENTEROCELE REPAIR     x2   EYE SURGERY     retina   herniated disc  11/25/2021   L4 and L5   hysterectomy - unknown type     JOINT REPLACEMENT     KNEE ARTHROPLASTY     KNEE SURGERY     x6   NECK SURGERY     fusion   OOPHORECTOMY     QUADRICEPS REPAIR Right    RECTOCELE REPAIR     x2   SHOULDER ARTHROSCOPY WITH SUBACROMIAL DECOMPRESSION, ROTATOR CUFF REPAIR AND BICEP TENDON REPAIR  10/06/2012   Procedure: SHOULDER ARTHROSCOPY WITH SUBACROMIAL DECOMPRESSION, ROTATOR CUFF  REPAIR AND BICEP TENDON REPAIR;  Surgeon: Derald Flattery, MD;  Location: [facility];  Service: Orthopedics;  Laterality: Right;  Arthroscopic  Repair  of  Subscapularis, Open Biceps Tenodesis   SHOULDER SURGERY     bilateral- bones spur   SHOULDER SURGERY Left 04/15/2021   TONSILLECTOMY     TOTAL KNEE ARTHROPLASTY     TOTAL SHOULDER ARTHROPLASTY       Family History  Problem Relation Age of Onset   Heart attack Mother    Stroke Mother    Diabetes Mother    Heart disease Mother    Colon polyps Mother    Asthma Mother    Dementia Mother    Alzheimer's disease Mother    Heart disease Father    Heart attack Father    Asthma Father    Stroke Sister    Hypertension Sister    Rheum arthritis Sister    Dementia Sister    Asthma Sister    Parkinson's disease Sister    Lupus Sister    Asthma Sister    Breast cancer Maternal Grandmother    Wilson's disease Maternal Grandmother    Pancreatic cancer Maternal Grandfather    Autoimmune disease Daughter        in eyes   Arthritis Daughter    Deep vein thrombosis Daughter      Social History   Socioeconomic History   Marital status: Married    Spouse name: Autry Legions "Siegfried Dress"   Number of children: 2   Years of education: 14   Highest education level: Associate degree: academic program  Occupational History   Occupation: Retired  Tobacco Use   Smoking status: Never    Passive exposure: Never   Smokeless tobacco: Never  Vaping Use   Vaping status: Never Used  Substance and Sexual Activity   Alcohol use: No    Alcohol/week: 0.0 standard drinks of alcohol   Drug use: No   Sexual activity: Not on file  Other Topics Concern   Not on file  Social History Narrative   Lives with her husband. Her grand daughter is living with them.  She likes to hang out with her friends and play games and do bible study.   Social Drivers of Corporate investment banker Strain: Low Risk  (05/25/2023)   Overall Financial  Resource Strain (CARDIA)    Difficulty of Paying Living Expenses: Not hard at all  Food Insecurity: No Food Insecurity (05/25/2023)   Hunger Vital Sign    Worried About Running Out of Food in the Last Year: Never true    Ran Out of Food in the Last Year: Never true  Transportation Needs: No Transportation Needs (05/25/2023)   PRAPARE - Administrator, Civil Service (Medical): No    Lack of Transportation (Non-Medical): No  Physical Activity: Inactive (10/25/2023)   Exercise Vital Sign    Days of Exercise per Week: 0 days    Minutes of Exercise per Session: 0 min  Stress: Stress Concern Present (10/25/2023)   [employer] of Occupational Health - Occupational Stress Questionnaire    Feeling of Stress : To some extent  Social Connections: Socially Integrated (05/25/2023)   Social Connection and Isolation Panel [NHANES]    Frequency of Communication with Friends and Family: More than three times a week    Frequency of Social Gatherings with Friends and Family: More than three times a week    Attends Religious Services: More than 4 times per year    Active Member of [name] or Organizations: Yes    Attends Banker Meetings: More  than 4 times per year    Marital Status: Married  Catering manager Violence: Not At Risk (05/25/2023)   Humiliation, Afraid, Rape, and Kick questionnaire    Fear of Current or Ex-Partner: No    Emotionally Abused: No    Physically Abused: No    Sexually Abused: No     BP 130/78 (BP Location: Left Arm, Patient Position: Sitting, Cuff Size: Normal)   Pulse 60   Resp 16   Ht 5\' 6"  (1.676 m)   Wt 174 lb (78.9 kg)   SpO2 95%   BMI 28.08 kg/m   Physical Exam:  Well appearing NAD HEENT: Unremarkable Neck:  No JVD, no thyromegally Lymphatics:  No adenopathy Back:  No CVA tenderness Lungs:  Clear with no wheezes HEART:  Regular rate rhythm, no murmurs, no rubs, no clicks Abd:  soft, positive bowel sounds, no organomegally, no  rebound, no guarding Ext:  2 plus pulses, no edema, no cyanosis, no clubbing Skin:  No rashes no nodules Neuro:  CN II through XII intact, motor grossly intact   Assess/Plan:  Palpitations - she has not had any documented SVT in many years. She has remotely had documented SVT.  She will continue her beta blocker.  HTN - this is her biggest problem. She is on losartan  and metoprolol  and norvasc . I strongly encouraged her to avoid salty food. We might consider switching to coreg in the future. 3. Dementia - she is on both aricept  and namenda . We will follow.    Pete Brand Latiya Navia,MD

## 2023-11-10 NOTE — Patient Instructions (Addendum)
 Medication Instructions:  Your physician recommends that you continue on your current medications as directed. Please refer to the Current Medication list given to you today.  *If you need a refill on your cardiac medications before your next appointment, please call your pharmacy*  Lab Work: None ordered.  If you have labs (blood work) drawn today and your tests are completely normal, you will receive your results only by: MyChart Message (if you have MyChart) OR A paper copy in the mail If you have any lab test that is abnormal or we need to change your treatment, we will call you to review the results.  Testing/Procedures: None ordered.  Follow-Up: At [facility], you and your health needs are our priority.  As part of our continuing mission to provide you with exceptional heart care, we have created designated Provider Care Teams.  These Care Teams include your primary Cardiologist (physician) and Advanced Practice Providers (APPs -  Physician Assistants and Nurse Practitioners) who all work together to provide you with the care you need, when you need it.  We recommend signing up for the patient portal called "MyChart".  Sign up information is provided on this After Visit Summary.  MyChart is used to connect with patients for Virtual Visits (Telemedicine).  Patients are able to view lab/test results, encounter notes, upcoming appointments, etc.  Non-urgent messages can be sent to your provider as well.   To learn more about what you can do with MyChart, go to [email or web address].    Your next appointment:   1 year(s)  The format for your next appointment:   In Person  Provider:   Dr Sena Dam one of the following Advanced Practice Providers on your designated Care Team:   Mertha Abrahams, PA-C Bambi Lever [address]" [place], [place] Neda Balk, NP    Important Information About Sugar

## 2023-11-11 ENCOUNTER — Encounter: Payer: Self-pay | Admitting: Adult Health

## 2023-11-12 ENCOUNTER — Other Ambulatory Visit: Payer: Self-pay | Admitting: Orthopedic Surgery

## 2023-11-12 DIAGNOSIS — M545 Low back pain, unspecified: Secondary | ICD-10-CM

## 2023-11-16 ENCOUNTER — Ambulatory Visit: Payer: Self-pay | Admitting: Licensed Clinical Social Worker

## 2023-11-16 NOTE — Patient Instructions (Signed)
Visit Information  Thank you for taking time to visit with me today. Please don't hesitate to contact me if I can be of assistance to you.   Following are the goals we discussed today:   Goals Addressed             This Visit's Progress    Patient Stated she has periodic falls. No serious injuries. She gets dizzy occasionally. Left knee pain       Interventions: Spoke with client via phone today about client needs and status.   Client is at risk for falls. She has cane, wheelchair and a walker to use as needed to help her walk Discussed medication procurement of client Discussed client support from PCP, Dr. Nani Gasser. Used Active Listening skills to hear client needs Discussed program support with RN, LCSW, Pharmacist Client said she has a scheduled appointment for MRI tomorrow She said she has appointment with PCP next week Discussed pain issues of client. Client spoke of back pain issue. She spoke of knee pain issue Provided counseling support for client Client has support from her spouse and from other family members. Discussed transport of client. She said she has no transport needs at present Thanked client for phone call today with LCSW Encouraged client or spouse to call LCSW as needed for SW support for client.at [phone]          LCSW to call client as scheduled to assess client needs at that time  Please call the care guide team at [phone] if you need to cancel or reschedule your appointment.   If you are experiencing a Mental Health or Behavioral Health Crisis or need someone to talk to, please go to [facility] [address], [place] [phone])   The patient verbalized understanding of instructions, educational materials, and care plan provided today and DECLINED offer to receive copy of patient instructions, educational materials, and care plan.   The patient has been provided with contact information  for the care management team and has been advised to call with any health related questions or concerns.    Lorna Few  MSW, LCSW [place] Licensed Clinical Social Worker Direct Dial:  [phone] Fax:  [phone] Website:  [email or web address]

## 2023-11-16 NOTE — Patient Outreach (Signed)
  Care Coordination   Follow Up Visit Note   11/16/2023 Name: Mia [name]  MRN: [id number] DOB: Jun 01, 1951  Mia [name] is a 73 y.o. year old female who sees Metheney, Barbarann Ehlers, MD for primary care. I spoke with  Mia [name] by phone today.  What matters to the patients health and wellness today?  Patient Stated she has periodic falls. No serious injuries. She gets dizzy occasionally. Left knee pain    Goals Addressed             This Visit's Progress    Patient Stated she has periodic falls. No serious injuries. She gets dizzy occasionally. Left knee pain       Interventions: Spoke with client via phone today about client needs and status.   Client is at risk for falls. She has cane, wheelchair and a walker to use as needed to help her walk Discussed medication procurement of client Discussed client support from PCP, Dr. Nani Gasser. Used Active Listening skills to hear client needs Discussed program support with RN, LCSW, Pharmacist Client said she has a scheduled appointment for MRI tomorrow She said she has appointment with PCP next week Discussed pain issues of client. Client spoke of back pain issue. She spoke of knee pain issue Provided counseling support for client Client has support from her spouse and from other family members. Discussed transport of client. She said she has no transport needs at present Thanked client for phone call today with LCSW Encouraged client or spouse to call LCSW as needed for SW support for client.at [phone]          SDOH assessments and interventions completed:  Yes  SDOH Interventions Today    Flowsheet Row Most Recent Value  SDOH Interventions   Physical Activity Interventions Other (Comments)  [at risk for falls. she has cane, wheelchair, and walker to use as needed to help her walk]  Stress Interventions Provide Counseling  [client has knee pain issues and back pain issues. She has MRI scheduled for tomorrow.  she has stress in managing medical needs]        Care Coordination Interventions:  Yes, provided   Interventions Today    Flowsheet Row Most Recent Value  Chronic Disease   Chronic disease during today's visit Other  [spoke with client about client needs]  General Interventions   General Interventions Discussed/Reviewed General Interventions Discussed, Community Resources  Education Interventions   Education Provided Provided Education  Provided Verbal Education On [facility]  Mental Health Interventions   Mental Health Discussed/Reviewed Coping Strategies  [using coping skills to help manage pain issues and to manage stress issues]  Nutrition Interventions   Nutrition Discussed/Reviewed Nutrition Discussed  Pharmacy Interventions   Pharmacy Dicussed/Reviewed Pharmacy Topics Discussed  Safety Interventions   Safety Discussed/Reviewed Fall Risk        Follow up plan: LCSW to call client as scheduled to assess client needs at that time   Encounter Outcome:  Patient Visit Completed    Lorna Few  MSW, LCSW /[facility] Licensed Clinical Social Worker Direct Dial:  [phone] Fax:  [phone] Website:  [email or web address]

## 2023-11-17 ENCOUNTER — Ambulatory Visit
Admission: RE | Admit: 2023-11-17 | Discharge: 2023-11-17 | Disposition: A | Discharge status: Home / Self Care | Payer: Medicare Other | Source: Ambulatory Visit | Attending: Orthopedic Surgery | Admitting: Orthopedic Surgery

## 2023-11-17 DIAGNOSIS — M545 Low back pain, unspecified: Secondary | ICD-10-CM

## 2023-11-17 DIAGNOSIS — M48061 Spinal stenosis, lumbar region without neurogenic claudication: Secondary | ICD-10-CM | POA: Diagnosis not present

## 2023-11-17 DIAGNOSIS — M47816 Spondylosis without myelopathy or radiculopathy, lumbar region: Secondary | ICD-10-CM | POA: Diagnosis not present

## 2023-11-18 ENCOUNTER — Encounter: Payer: Self-pay | Admitting: Family Medicine

## 2023-11-18 ENCOUNTER — Telehealth: Payer: Self-pay | Admitting: Family Medicine

## 2023-11-18 ENCOUNTER — Ambulatory Visit: Payer: Medicare Other | Admitting: Family Medicine

## 2023-11-18 ENCOUNTER — Inpatient Hospital Stay: Admit: 2023-11-18 | Discharge: 2023-11-29 | Payer: MEDICARE | Attending: Radiation Oncology | Primary: Internal Medicine

## 2023-11-18 VITALS — BP 112/55 | HR 65 | Ht 66.0 in | Wt 170.0 lb

## 2023-11-18 DIAGNOSIS — L853 Xerosis cutis: Secondary | ICD-10-CM

## 2023-11-18 DIAGNOSIS — E538 Deficiency of other specified B group vitamins: Secondary | ICD-10-CM

## 2023-11-18 DIAGNOSIS — I10 Essential (primary) hypertension: Secondary | ICD-10-CM

## 2023-11-18 DIAGNOSIS — R7301 Impaired fasting glucose: Secondary | ICD-10-CM | POA: Diagnosis not present

## 2023-11-18 DIAGNOSIS — D509 Iron deficiency anemia, unspecified: Secondary | ICD-10-CM | POA: Diagnosis not present

## 2023-11-18 DIAGNOSIS — M51369 Other intervertebral disc degeneration, lumbar region without mention of lumbar back pain or lower extremity pain: Secondary | ICD-10-CM | POA: Diagnosis not present

## 2023-11-18 DIAGNOSIS — G4733 Obstructive sleep apnea (adult) (pediatric): Secondary | ICD-10-CM | POA: Diagnosis not present

## 2023-11-18 LAB — POCT GLYCOSYLATED HEMOGLOBIN (HGB A1C): Hemoglobin A1C: 5.6 % (ref 4.0–5.6)

## 2023-11-18 MED ORDER — CYANOCOBALAMIN 1000 MCG/ML IJ SOLN
1000.0000 ug | Freq: Once | INTRAMUSCULAR | Status: AC
  Administered 2023-11-18: 1000 ug via INTRAMUSCULAR

## 2023-11-18 MED ORDER — AMBULATORY NON FORMULARY MEDICATION: 0 refills | Status: DC

## 2023-11-18 MED ORDER — TRIAMCINOLONE ACETONIDE 0.1 % EX CREA: 1.0000 | TOPICAL_CREAM | Freq: Two times a day (BID) | CUTANEOUS | 0 refills | Status: DC

## 2023-11-18 NOTE — Assessment & Plan Note (Signed)
A1c looks fantastic today at 5.6.  Continue to work on [employer] and staying active.  Lab Results  Component Value Date   HGBA1C 5.6 11/18/2023

## 2023-11-18 NOTE — Progress Notes (Signed)
B12 injection given to pt at OV today.   Pt reports no negative side effects from medication. Denies any dizziness, chest pain or palpitations, and no GI problems.  Injection given in LUOQ she tolerated well . She will RTC in 30 days for next B12 injection.

## 2023-11-18 NOTE — Assessment & Plan Note (Signed)
Did review the radiology report for the MRI.  Encouraged her to keep appoint with Dr. Yevette Edwards on Monday to discuss the areas particularly that are more moderate to severe and if he feels that these are amenable to surgery and would truly benefit her.

## 2023-11-18 NOTE — Progress Notes (Signed)
Established Patient Office Visit  Subjective  Patient ID: Mia [name], female    DOB: 1951/06/18  Age: 73 y.o. MRN: [id number]  Chief Complaint  Patient presents with   ifg   Hypertension   B12 Injection    HPI  Impaired fasting glucose-no increased thirst or urination. No symptoms consistent with hypoglycemia.  Hypertension- Pt denies chest pain, SOB, dizziness, or heart palpitations.  Taking meds as directed w/o problems.  Denies medication side effects.    Due for B12 inj today. .    Getting dry patches on her hands and then they crack and bleed.   She says the neurologist has been trying to get the notes from [facility] in regards to her testing that was done for her dizziness and hearing loss.  She is seeing Dr. Estill Bamberg and had her MRI done yesterday.  She follows up with him next Monday to go over the results.  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  They are discussing possible surgery.  She is also here to go over her recent sleep study results.    ROS    Objective:     BP (!) 112/55   Pulse 65   Ht 5\' 6"  (1.676 m)   Wt 170 lb (77.1 kg)   SpO2 99%   BMI 27.44 kg/m    Physical Exam Vitals and nursing note reviewed.  Constitutional:      Appearance: Normal appearance.  HENT:     Head: Normocephalic and atraumatic.  Eyes:     Conjunctiva/sclera: Conjunctivae normal.  Cardiovascular:     Rate and Rhythm: Normal rate and regular rhythm.  Pulmonary:     Effort: Pulmonary effort is normal.     Breath sounds: Normal breath sounds.  Skin:    General: Skin is warm and dry.  Neurological:     Mental Status: She is alert.  Psychiatric:        Mood and Affect: Mood normal.      Results for orders placed or performed in visit on 11/18/23  POCT HgB A1C  Result Value Ref Range   Hemoglobin A1C 5.6 4.0 - 5.6 %    HbA1c POC (<> result, manual entry)     HbA1c, POC (prediabetic range)     HbA1c, POC (controlled diabetic range)        The 10-year ASCVD risk score (Arnett DK, et al., 2019) is: 21.9%    Assessment & Plan:   Problem List Items Addressed This Visit       Cardiovascular and Mediastinum   Hypertension   Well controlled. Continue current regimen. Follow up in  37mo         Respiratory   OSA (obstructive sleep apnea)   Relevant Medications   AMBULATORY NON FORMULARY MEDICATION     Endocrine   IFG (impaired fasting glucose)   A1c looks fantastic today at 5.6.  Continue to work on [employer] and staying active.  Lab Results  Component Value Date   HGBA1C 5.6 11/18/2023         Relevant Orders   POCT HgB A1C (Completed)     Musculoskeletal and Integument   Lumbar degenerative disc disease   Did review the radiology report for the MRI.  Encouraged her to keep appoint with Dr. Yevette Edwards on Monday to discuss the areas particularly that are more moderate  to severe and if he feels that these are amenable to surgery and would truly benefit her.        Other   B12 deficiency - Primary   Other Visit Diagnoses       Dry skin dermatitis       Relevant Medications   triamcinolone cream (KENALOG) 0.1 %       Skin dermatitis-do a thin layer of triamcinolone and apply thick hydrating lotion on top.  Can apply the moisturizer several times a day and see if the skin appears to heal over the next couple of weeks avoid scratching at the areas.  Return in about 4 months (around 03/17/2024) for IFG, Hypertension.    I spent 40 minutes on the day of the encounter to include pre-visit record review, face-to-face time with the patient and post visit ordering of test.   Nani Gasser, MD

## 2023-11-18 NOTE — Assessment & Plan Note (Signed)
Well controlled. Continue current regimen. Follow up in  6 mo  

## 2023-11-19 ENCOUNTER — Other Ambulatory Visit: Payer: Self-pay | Admitting: *Deleted

## 2023-11-19 DIAGNOSIS — M818 Other osteoporosis without current pathological fracture: Secondary | ICD-10-CM

## 2023-11-19 DIAGNOSIS — E782 Mixed hyperlipidemia: Secondary | ICD-10-CM | POA: Diagnosis not present

## 2023-11-19 DIAGNOSIS — Z79899 Other long term (current) drug therapy: Secondary | ICD-10-CM

## 2023-11-20 ENCOUNTER — Encounter: Payer: Self-pay | Admitting: Physician Assistant

## 2023-11-20 ENCOUNTER — Encounter: Payer: Self-pay | Admitting: Family Medicine

## 2023-11-20 DIAGNOSIS — E785 Hyperlipidemia, unspecified: Secondary | ICD-10-CM

## 2023-11-20 LAB — COMPLETE METABOLIC PANEL WITH GFR
AG Ratio: 1.5 (calc) (ref 1.0–2.5)
ALT: 44 U/L — ABNORMAL HIGH (ref 6–29)
AST: 48 U/L — ABNORMAL HIGH (ref 10–35)
Albumin: 4.2 g/dL (ref 3.6–5.1)
Alkaline phosphatase (APISO): 157 U/L — ABNORMAL HIGH (ref 37–153)
BUN/Creatinine Ratio: 8 (calc) (ref 6–22)
BUN: 6 mg/dL — ABNORMAL LOW (ref 7–25)
CO2: 27 mmol/L (ref 20–32)
Calcium: 10.1 mg/dL (ref 8.6–10.4)
Chloride: 95 mmol/L — ABNORMAL LOW (ref 98–110)
Creat: 0.78 mg/dL (ref 0.60–1.00)
Globulin: 2.8 g/dL (ref 1.9–3.7)
Glucose, Bld: 89 mg/dL (ref 65–99)
Potassium: 4.7 mmol/L (ref 3.5–5.3)
Sodium: 130 mmol/L — ABNORMAL LOW (ref 135–146)
Total Bilirubin: 0.5 mg/dL (ref 0.2–1.2)
Total Protein: 7 g/dL (ref 6.1–8.1)
eGFR: 81 mL/min/{1.73_m2} (ref >=60)

## 2023-11-20 LAB — CBC WITH DIFFERENTIAL/PLATELET
Absolute Lymphocytes: 1485 {cells}/uL (ref 850–3900)
Absolute Monocytes: 784 {cells}/uL (ref 200–950)
Basophils Absolute: 69 {cells}/uL (ref 0–200)
Basophils Relative: 1.4 %
Eosinophils Absolute: 123 {cells}/uL (ref 15–500)
Eosinophils Relative: 2.5 %
HCT: 34.9 % — ABNORMAL LOW (ref 35.0–45.0)
Hemoglobin: 11.3 g/dL — ABNORMAL LOW (ref 11.7–15.5)
MCH: 27 pg (ref 27.0–33.0)
MCHC: 32.4 g/dL (ref 32.0–36.0)
MCV: 83.5 fL (ref 80.0–100.0)
MPV: 10.9 fL (ref 7.5–12.5)
Monocytes Relative: 16 %
Neutro Abs: 2440 {cells}/uL (ref 1500–7800)
Neutrophils Relative %: 49.8 %
Platelets: 415 10*3/uL — ABNORMAL HIGH (ref 140–400)
RBC: 4.18 10*6/uL (ref 3.80–5.10)
RDW: 15 % (ref 11.0–15.0)
Total Lymphocyte: 30.3 %
WBC: 4.9 10*3/uL (ref 3.8–10.8)

## 2023-11-20 LAB — LIPID PANEL
Cholesterol: 194 mg/dL (ref <200)
HDL: 56 mg/dL (ref >=50)
LDL Cholesterol (Calc): 116 mg/dL — ABNORMAL HIGH
Non-HDL Cholesterol (Calc): 138 mg/dL — ABNORMAL HIGH (ref <130)
Total CHOL/HDL Ratio: 3.5 (calc) (ref <5.0)
Triglycerides: 108 mg/dL (ref <150)

## 2023-11-20 LAB — VITAMIN D 25 HYDROXY (VIT D DEFICIENCY, FRACTURES): Vit D, 25-Hydroxy: 86 ng/mL (ref 30–100)

## 2023-11-22 DIAGNOSIS — M5416 Radiculopathy, lumbar region: Secondary | ICD-10-CM | POA: Diagnosis not present

## 2023-11-23 NOTE — Telephone Encounter (Signed)
See MyChart note.

## 2023-11-25 ENCOUNTER — Other Ambulatory Visit (HOSPITAL_COMMUNITY): Payer: Self-pay

## 2023-11-25 MED ORDER — ROSUVASTATIN CALCIUM 5 MG PO TABS
5.0000 mg | ORAL_TABLET | Freq: Every day | ORAL | 3 refills | Status: DC
Start: 2023-11-25 — End: 2023-11-25
  Filled 2023-11-25: qty 90, 90d supply, fill #0

## 2023-11-25 MED ORDER — ROSUVASTATIN CALCIUM 5 MG PO TABS: 5.0000 mg | ORAL_TABLET | Freq: Every day | ORAL | 3 refills | Status: DC

## 2023-11-30 ENCOUNTER — Encounter: Payer: Self-pay | Admitting: Family Medicine

## 2023-12-03 ENCOUNTER — Other Ambulatory Visit: Payer: Self-pay | Admitting: Family Medicine

## 2023-12-08 ENCOUNTER — Other Ambulatory Visit: Payer: Self-pay | Admitting: Orthopedic Surgery

## 2023-12-10 ENCOUNTER — Encounter: Payer: Self-pay | Admitting: Family Medicine

## 2023-12-11 ENCOUNTER — Other Ambulatory Visit: Payer: Self-pay | Admitting: Cardiovascular Disease

## 2023-12-13 ENCOUNTER — Telehealth: Payer: Self-pay

## 2023-12-13 NOTE — Telephone Encounter (Signed)
Contacted the patient and patient states she believes she has had some swelling, but cannot tell. Patient states she would like to schedule an ultrasound.

## 2023-12-13 NOTE — Telephone Encounter (Signed)
[place] scheduled 12/15/2023

## 2023-12-13 NOTE — Telephone Encounter (Signed)
Patient contacted the office and states for the last two weeks she has had a lot of stiffness in her hands, specifically in her right hand. Patient states the stiffness in her right hand lasts all day. Patient states she has pain in her ring finger on her right hand. Patient states she has been taking her gabapentin and Tylenol Arthritis. Patient states she has been taking her leflunomide as prescribed. Patient states she does not know what is going on. Please advise.

## 2023-12-13 NOTE — Telephone Encounter (Signed)
Please clarify if she is having any joint swelling? Would she like to schedule an ultrasound to assess for synovitis?

## 2023-12-13 NOTE — Telephone Encounter (Signed)
Forwarding to tonya B to check portal again this week.

## 2023-12-14 ENCOUNTER — Ambulatory Visit: Payer: Medicare Other

## 2023-12-14 ENCOUNTER — Telehealth: Payer: Self-pay | Admitting: Cardiovascular Disease

## 2023-12-14 ENCOUNTER — Encounter: Payer: Self-pay | Admitting: Rheumatology

## 2023-12-14 ENCOUNTER — Ambulatory Visit: Payer: Medicare Other | Attending: Rheumatology | Admitting: Rheumatology

## 2023-12-14 VITALS — BP 160/77 | HR 61 | Resp 17 | Ht 66.0 in | Wt 174.4 lb

## 2023-12-14 DIAGNOSIS — Z8781 Personal history of (healed) traumatic fracture: Secondary | ICD-10-CM | POA: Diagnosis not present

## 2023-12-14 DIAGNOSIS — Z9889 Other specified postprocedural states: Secondary | ICD-10-CM | POA: Diagnosis not present

## 2023-12-14 DIAGNOSIS — R5382 Chronic fatigue, unspecified: Secondary | ICD-10-CM

## 2023-12-14 DIAGNOSIS — I7300 Raynaud's syndrome without gangrene: Secondary | ICD-10-CM | POA: Diagnosis not present

## 2023-12-14 DIAGNOSIS — Z5181 Encounter for therapeutic drug level monitoring: Secondary | ICD-10-CM | POA: Diagnosis not present

## 2023-12-14 DIAGNOSIS — R7989 Other specified abnormal findings of blood chemistry: Secondary | ICD-10-CM | POA: Diagnosis not present

## 2023-12-14 DIAGNOSIS — Z79899 Other long term (current) drug therapy: Secondary | ICD-10-CM

## 2023-12-14 DIAGNOSIS — Z8679 Personal history of other diseases of the circulatory system: Secondary | ICD-10-CM | POA: Diagnosis not present

## 2023-12-14 DIAGNOSIS — M65342 Trigger finger, left ring finger: Secondary | ICD-10-CM

## 2023-12-14 DIAGNOSIS — M79641 Pain in right hand: Secondary | ICD-10-CM | POA: Diagnosis not present

## 2023-12-14 DIAGNOSIS — M0600 Rheumatoid arthritis without rheumatoid factor, unspecified site: Secondary | ICD-10-CM

## 2023-12-14 DIAGNOSIS — M79642 Pain in left hand: Secondary | ICD-10-CM

## 2023-12-14 DIAGNOSIS — Z8719 Personal history of other diseases of the digestive system: Secondary | ICD-10-CM

## 2023-12-14 DIAGNOSIS — M818 Other osteoporosis without current pathological fracture: Secondary | ICD-10-CM

## 2023-12-14 DIAGNOSIS — M797 Fibromyalgia: Secondary | ICD-10-CM | POA: Diagnosis not present

## 2023-12-14 DIAGNOSIS — M51369 Other intervertebral disc degeneration, lumbar region without mention of lumbar back pain or lower extremity pain: Secondary | ICD-10-CM

## 2023-12-14 DIAGNOSIS — M1120 Other chondrocalcinosis, unspecified site: Secondary | ICD-10-CM

## 2023-12-14 DIAGNOSIS — N3010 Interstitial cystitis (chronic) without hematuria: Secondary | ICD-10-CM | POA: Diagnosis not present

## 2023-12-14 DIAGNOSIS — M65341 Trigger finger, right ring finger: Secondary | ICD-10-CM

## 2023-12-14 DIAGNOSIS — M5134 Other intervertebral disc degeneration, thoracic region: Secondary | ICD-10-CM

## 2023-12-14 DIAGNOSIS — M5030 Other cervical disc degeneration, unspecified cervical region: Secondary | ICD-10-CM | POA: Diagnosis not present

## 2023-12-14 DIAGNOSIS — R0602 Shortness of breath: Secondary | ICD-10-CM

## 2023-12-14 DIAGNOSIS — M1712 Unilateral primary osteoarthritis, left knee: Secondary | ICD-10-CM | POA: Diagnosis not present

## 2023-12-14 DIAGNOSIS — I1A0 Resistant hypertension: Secondary | ICD-10-CM

## 2023-12-14 DIAGNOSIS — Z8619 Personal history of other infectious and parasitic diseases: Secondary | ICD-10-CM | POA: Diagnosis not present

## 2023-12-14 DIAGNOSIS — Z86718 Personal history of other venous thrombosis and embolism: Secondary | ICD-10-CM | POA: Diagnosis not present

## 2023-12-14 DIAGNOSIS — Z96651 Presence of right artificial knee joint: Secondary | ICD-10-CM | POA: Diagnosis not present

## 2023-12-14 MED ORDER — LIDOCAINE HCL 1 % IJ SOLN
0.5000 mL | INTRAMUSCULAR | Status: AC | PRN
Start: 2023-12-14 — End: 2023-12-14
  Administered 2023-12-14: .5 mL

## 2023-12-14 MED ORDER — TRIAMCINOLONE ACETONIDE 40 MG/ML IJ SUSP
20.0000 mg | INTRAMUSCULAR | Status: AC | PRN
  Administered 2023-12-14: 20 mg

## 2023-12-14 MED ORDER — TRIAMTERENE-HCTZ 37.5-25 MG PO TABS: 1.0000 | ORAL_TABLET | Freq: Every morning | ORAL | 3 refills | Status: DC

## 2023-12-14 NOTE — Telephone Encounter (Signed)
Pt c/o BP issue: STAT if pt c/o blurred vision, one-sided weakness or slurred speech  1. What are your last 5 BP readings?   180/76   2. Are you having any other symptoms (ex. Dizziness, headache, blurred vision, passed out)? Headache and she said a little blurred vision but nothing currently. She states Sunday night she felt very dizzy.   3. What is your BP issue?  Pt states her bp has been high and with her medications she is not sure of a good regimen to keep her bp down. Please advise.

## 2023-12-14 NOTE — Telephone Encounter (Signed)
Spoke to patient and she reports that starting on Sunday she has been having elevated blood pressures ranging from 170s to 180 systolic with diastolic in the 80s. Pt reports that she has [facility] currently and when her blood pressure becomes elevated she has a severe headache, "swimmy headed", and nausea. Pt denies those symptoms currently and denies swelling. Pt reports that she taker her Valsartan and metoprolol in the mornings and takes Norvasc around 3:15 pm. Mia [name] and spoke with DOD (Dr. Jacinto Halim) and he states for the patient to start Maxzide 37.5-25 mg take one tablet by mouth in the morning. Also, has BMP,  BNP, and Renal duplex ordered. Pt contacted and advised plan of care. Dr. Jacinto Halim would like pt to follow up in 2 weeks. Appt made with Jari Favre, PA-C on 3/7. Pt has been made aware of above information and agrees with plan of care.   Also verified with pharmacist Phylis Bougie) that it is okay to proceed with order for Maxzide due to Celebrex allergy. Verbal advised okay to proceed with order.

## 2023-12-14 NOTE — Patient Instructions (Addendum)
Please reduce dose of leflunomide to 20 mg alternating with 10 mg by mouth every other day.  Standing Labs We placed an order today for your standing lab work.   Please have your standing labs drawn in April and every 3 months  Please have your labs drawn 2 weeks prior to your appointment so that the provider can discuss your lab results at your appointment, if possible.  Please note that you may see your imaging and lab results in MyChart before we have reviewed them. We will contact you once all results are reviewed. Please allow our office up to 72 hours to thoroughly review all of the results before contacting the office for clarification of your results.  WALK-IN LAB HOURS  Monday through Thursday from 8:00 am -12:30 pm and 1:00 pm-5:00 pm and Friday from 8:00 am-12:00 pm.  Patients with office visits requiring labs will be seen before walk-in labs.  You may encounter longer than normal wait times. Please allow additional time. Wait times may be shorter on  Monday and Thursday afternoons.  We do not book appointments for walk-in labs. We appreciate your patience and understanding with our staff.   Labs are drawn by [facility]. Please bring your co-pay at the time of your lab draw.  You may receive a bill from [facility] for your lab work.  Please note if you are on Hydroxychloroquine and and an order has been placed for a Hydroxychloroquine level,  you will need to have it drawn 4 hours or more after your last dose.  If you wish to have your labs drawn at another location, please call the office 24 hours in advance so we can fax the orders.  The office is located at [address], [place], [place] [address]   If you have any questions regarding directions or hours of operation,  please call [phone].   As a reminder, please drink plenty of water prior to coming for your lab work. Thanks!   Vaccines You are taking a medication(s) that can suppress your immune system.  The  following immunizations are recommended: Flu annually Covid-19 RSV Td/Tdap (tetanus, diphtheria, pertussis) every 10 years Pneumonia (Prevnar 15 then Pneumovax 23 at least 1 year apart.  Alternatively, can take Prevnar 20 without needing additional dose) Shingrix: 2 doses from 4 weeks to 6 months apart  Please check with your PCP to make sure you are up to date.   If you have signs or symptoms of an infection or start antibiotics: First, call your PCP for workup of your infection. Hold your medication through the infection, until you complete your antibiotics, and until symptoms resolve if you take the following: Injectable medication (Actemra, Benlysta, Cimzia, Cosentyx, Enbrel, Humira, Kevzara, Orencia, Remicade, Simponi, Stelara, Taltz, Tremfya) Methotrexate Leflunomide (Arava) Mycophenolate (Cellcept) Harriette Ohara, Olumiant, or Rinvoq

## 2023-12-14 NOTE — Progress Notes (Signed)
Office Visit Note  Patient: Mia [name]             Date of Birth: 04/19/1951           MRN: [id number]             PCP: Agapito Games, MD Referring: Agapito Games, * Visit Date: 12/14/2023 Occupation: @GUAROCC @  Subjective:  Pain in hands  History of Present Illness: Mia [name] is a 73 y.o. female with seronegative rheumatoid arthritis and osteoarthritis.  She returns today after her last visit on August 26, 2023.  She states has been having pain and discomfort in her bilateral hands with morning stiffness.  She states her right ring finger has been very painful and the triggering.  She states she has been having nocturnal pain in her right ring finger.  Continues to have some discomfort in her shoulders and her knees.  She is scheduled to have back fusion on January 12, 2024 by Dr. Yevette Edwards.  She states her blood pressure has been running high and she has an appointment with the cardiologist.    Activities of Daily Living:  Patient reports morning stiffness for 15 minutes.   Patient Reports nocturnal pain.  Difficulty dressing/grooming: Denies Difficulty climbing stairs: Reports Difficulty getting out of chair: Reports Difficulty using hands for taps, buttons, cutlery, and/or writing: Denies  Review of Systems  Constitutional:  Positive for fatigue.  HENT:  Positive for mouth dryness. Negative for mouth sores.   Eyes:  Negative for dryness.  Respiratory:  Positive for shortness of breath.   Cardiovascular:  Negative for chest pain and palpitations.  Gastrointestinal:  Negative for blood in stool, constipation and diarrhea.  Endocrine: Negative for increased urination.  Genitourinary:  Negative for involuntary urination.  Musculoskeletal:  Positive for joint pain, gait problem, joint pain, joint swelling, myalgias, muscle weakness, morning stiffness, muscle tenderness and myalgias.  Skin:  Positive for color change. Negative for rash, hair loss and sensitivity  to sunlight.  Allergic/Immunologic: Positive for susceptible to infections.  Neurological:  Positive for dizziness and headaches.  Hematological:  Negative for swollen glands.  Psychiatric/Behavioral:  Positive for sleep disturbance. Negative for depressed mood. The patient is not nervous/anxious.     PMFS History:  Patient Active Problem List   Diagnosis Date Noted   Acute deep vein thrombosis (DVT) of left peroneal vein (HCC) 08/23/2023   Pain of right sacroiliac joint 10/15/2022   Palpitations 06/02/2022   Pernicious anemia 09/11/2020   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   Weakness of both lower extremities 06/11/2020   Hyponatremia 05/22/2020   Gait instability 06/20/2019   Facet arthritis of lumbar region 05/10/2018   MCI (mild cognitive impairment) 11/25/2017   Restrictive lung disease 07/30/2017   Mixed hyperlipidemia 06/18/2017   Rectocele 04/26/2017   Irritable bowel syndrome with both constipation and diarrhea 04/26/2017   Osteoporosis 04/18/2017   Trochanteric bursitis of both hips 02/24/2017   Inflammatory arthritis 09/30/2016   High risk medication use 09/30/2016   History of Clostridium difficile colitis 09/30/2016   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   Pseudogout 09/30/2016   DDD cervical spine status post fusion 09/30/2016   DDD thoracic spine 09/30/2016   H/O total knee replacement, right 09/30/2016   Age-related osteoporosis without current pathological fracture 09/30/2016   Tremor, essential 05/14/2016   Right foot pain 04/14/2016   B12 deficiency 09/10/2015   Syncope 09/10/2015   Cervical facet joint syndrome 09/10/2015   Chronic fatigue 09/10/2015   Aortic  atherosclerosis (HCC) 04/01/2015   DOE (dyspnea on exertion)    Primary osteoarthritis of right knee 08/13/2014   Benign head tremor 06/11/2014   Lumbar degenerative disc disease 06/11/2014   IFG (impaired fasting glucose) 03/09/2014   Hemorrhoid 03/09/2014   Retinal wrinkling, right eye  03/09/2014   Fibromyalgia 03/09/2014   GERD (gastroesophageal reflux disease) 03/09/2014   History of arthroplasty of right knee 01/31/2014   Memory difficulty 12/14/2013   Hemorrhoids, external, thrombosed 06/22/2011   Hypertension 03/11/2011   SVT (supraventricular tachycardia) (HCC)    Raynaud phenomenon    Nonspecific (abnormal) findings on radiological and other examination of body structure 08/25/2010   COMPUTERIZED TOMOGRAPHY, CHEST, ABNORMAL 08/25/2010   OSA (obstructive sleep apnea) 04/24/2009   Allergic rhinitis 06/11/2008   Dyspnea 06/11/2008   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 difficile colitis    Chest pain    a. 01/2013 MV: EF 59%, no ischemia.   Chronic Dyspnea    a. 01/2013 Echo: EF 60-65%, Gr 1 DD, PASP 36mmHg.// Echo 01/2020: EF 60-65, no RWMA, GR 1 DD, GLS -21.6%, normal RV SF, trivial MR    COVID-19    DDD (degenerative disc disease), cervical    DDD (degenerative disc disease), lumbar    Diabetes mellitus    DVT (deep venous thrombosis) (HCC) 06/23/2023   left lower extremity   Fibromyalgia    Gall stones    GERD (gastroesophageal reflux disease)    gastritis   Hemorrhoids    Hypertension    Interstitial cystitis    MCI (mild cognitive impairment) 11/25/2017   Memory difficulty 12/14/2013   Neuromuscular disorder (HCC)    sclerosis   Osteoporosis    Peptic ulcer    PONV (postoperative nausea and vomiting)    Pseudogout    Raynaud phenomenon    Rectal bleeding    Sleep apnea    a. on cpap.   Stress fracture of left tibia 11/05/2016   SVT (supraventricular tachycardia) (HCC)    Syncope 09/10/2015   Thyroid disease    hypothyroidism   Tremor, essential 05/14/2016    Family History  Problem Relation Age of Onset    Heart attack Mother    Stroke Mother    Diabetes Mother    Heart disease Mother    Colon polyps Mother    Asthma Mother    Dementia Mother    Alzheimer's disease Mother    Heart disease Father    Heart attack Father    Asthma Father    Stroke Sister    Hypertension Sister    Rheum arthritis Sister    Dementia Sister    Asthma Sister    Parkinson's disease Sister    Lupus Sister    Asthma Sister    Breast cancer Maternal Grandmother    Wilson's disease Maternal Grandmother    Pancreatic cancer Maternal Grandfather    Autoimmune disease Daughter        in eyes   Arthritis Daughter    Deep vein thrombosis Daughter    Past Surgical History:  Procedure Laterality Date   APPENDECTOMY     BLADDER SURGERY     x2   BLADDER SURGERY     BREAST BIOPSY     CARDIAC  CATHETERIZATION     CARPAL TUNNEL RELEASE     CATARACT EXTRACTION Bilateral    CHOLECYSTECTOMY     DILATION AND CURETTAGE OF UTERUS     ENTEROCELE REPAIR     x2   EYE SURGERY     retina   herniated disc  11/25/2021   L4 and L5   hysterectomy - unknown type     JOINT REPLACEMENT     KNEE ARTHROPLASTY     KNEE SURGERY     x6   NECK SURGERY     fusion   OOPHORECTOMY     QUADRICEPS REPAIR Right    RECTOCELE REPAIR     x2   SHOULDER ARTHROSCOPY WITH SUBACROMIAL DECOMPRESSION, ROTATOR CUFF REPAIR AND BICEP TENDON REPAIR  10/06/2012   Procedure: SHOULDER ARTHROSCOPY WITH SUBACROMIAL DECOMPRESSION, ROTATOR CUFF REPAIR AND BICEP TENDON REPAIR;  Surgeon: Mable Paris, MD;  Location: [facility];  Service: Orthopedics;  Laterality: Right;  Arthroscopic  Repair  of  Subscapularis, Open Biceps Tenodesis   SHOULDER SURGERY     bilateral- bones spur   SHOULDER SURGERY Left 04/15/2021   TONSILLECTOMY     TOTAL KNEE ARTHROPLASTY     TOTAL SHOULDER ARTHROPLASTY     Social History   Social History Narrative   Lives with her husband. Her grand daughter is living with them.  She likes to hang out  with her friends and play games and do bible study.   Immunization History  Administered Date(s) Administered   Fluad Quad(high Dose 65+) 07/13/2019, 07/10/2020, 07/22/2021, 07/22/2022   Fluad Trivalent(High Dose 65+) 07/26/2023   Hepatitis B, ADULT 02/25/2021, 03/28/2021, 08/26/2021   Influenza Split 07/26/2012, 06/11/2014, 07/25/2015, 06/23/2018   Influenza Whole 07/30/2009, 08/19/2010   Influenza, High Dose Seasonal PF 07/06/2016, 07/05/2017   Influenza,inj,Quad PF,6+ Mos 06/11/2014, 07/25/2015, 06/23/2018   Influenza-Unspecified 07/26/2012, 07/26/2013, 09/05/2013, 06/11/2014, 07/25/2015, 06/23/2018, 06/27/2019, 07/13/2019, 07/10/2020, 07/22/2021   Moderna Sars-Covid-2 Vaccination 02/29/2020, 03/28/2020, 10/04/2020   Pneumococcal Conjugate-13 01/04/2017   Pneumococcal Polysaccharide-23 07/30/2009, 11/14/2018   Pneumococcal-Unspecified 10/27/2015   Tdap 02/13/2008, 05/12/2018   Zoster, Live 09/01/2011     Objective: Vital Signs: BP (!) 160/77 (BP Location: Left Arm, Patient Position: Sitting, Cuff Size: Normal)   Pulse 61   Resp 17   Ht 5\' 6"  (1.676 m)   Wt 174 lb 6.4 oz (79.1 kg)   BMI 28.15 kg/m    Physical Exam Vitals and nursing note reviewed.  Constitutional:      Appearance: She is well-developed.  HENT:     Head: Normocephalic and atraumatic.  Eyes:     Conjunctiva/sclera: Conjunctivae normal.  Cardiovascular:     Rate and Rhythm: Normal rate and regular rhythm.     Heart sounds: Normal heart sounds.  Pulmonary:     Effort: Pulmonary effort is normal.     Breath sounds: Normal breath sounds.  Abdominal:     General: Bowel sounds are normal.     Palpations: Abdomen is soft.  Musculoskeletal:     Cervical back: Normal range of motion.  Lymphadenopathy:     Cervical: No cervical adenopathy.  Skin:    General: Skin is warm and dry.     Capillary Refill: Capillary refill takes less than 2 seconds.  Neurological:     Mental Status: She is alert and oriented to  person, place, and time.  Psychiatric:        Behavior: Behavior normal.      Musculoskeletal Exam: She had limited  lateral rotation of the cervical spine.  Thoracic kyphosis was noted.  Shoulders, elbows, wrist joints, MCPs PIPs and DIPs Juengel range of motion with no synovitis.  She had thickening of bilateral MCP joints but no synovitis was noted.  Her right ring finger flexor tendon thickening was noted.  Hip joints and knee joints were in good range of motion.  There was no tenderness over ankles or MTPs.  CDAI Exam: CDAI Score: -- Patient Global: 40 / 100; Provider Global: 40 / 100 Swollen: --; Tender: -- Joint Exam 12/14/2023   No joint exam has been documented for this visit   There is currently no information documented on the homunculus. Go to the Rheumatology activity and complete the homunculus joint exam.  Investigation: No additional findings.  Imaging: US Guided Needle Placement Result Date: 12/14/2023 Ultrasound guided injection is preferred based studies that show increased duration, increased effect, greater accuracy, decreased procedural pain, increased response rate, and decreased cost with ultrasound guided versus blind injection.   Verbal informed consent obtained.  Time-out conducted.  Noted no overlying erythema, induration, or other signs of local infection. Ultrasound-guided trigger finger injection: After sterile prep with Betadine, injected 0.5 mL of 1% lidocaine and 20 mg Kenalog using a 27-gauge needle, in the flexor tendon sheath.    [place] LIMITED JOINT SPACE STRUCTURES UP BILAT Result Date: 12/14/2023 Ultrasound examination of bilateral hands was performed per EULAR recommendations. Using 15 MHz transducer, grayscale and power Doppler bilateral second and third MCP joints both dorsal and volar aspects were evaluated to look for synovitis or tenosynovitis. The findings were there was no synovitis or tenosynovitis on ultrasound examination. Impression: No  synovitis was noted on the limited ultrasound examination of both hands.  MR LUMBAR SPINE WO CONTRAST Result Date: 11/17/2023 CLINICAL DATA:  Low back pain with left-sided radiculopathy EXAM: MRI LUMBAR SPINE WITHOUT CONTRAST TECHNIQUE: Multiplanar, multisequence MR imaging of the lumbar spine was performed. No intravenous contrast was administered. COMPARISON:  01/15/2023 FINDINGS: Segmentation:  Standard. Alignment: Levocurvature. Minimal grade 1 anterolisthesis at L4-5 and L5-S1. Vertebrae:  No fracture, evidence of discitis, or bone lesion. Conus medullaris and cauda equina: Conus extends to the L1-2 level. Conus and cauda equina appear normal. Paraspinal and other soft tissues: Negative. Disc levels: T12-L1: No disc protrusion. Mild facet hypertrophy. No foraminal or canal stenosis. Unchanged. L1-L2: Minimal disc bulge and mild facet hypertrophy. No foraminal or canal stenosis. Unchanged. L2-L3: Disc bulge and endplate spurring, eccentric to the right. Mild facet hypertrophy. Moderate right subarticular recess stenosis without significant canal stenosis. Moderate-severe right foraminal stenosis. Unchanged. L3-L4: Annular disc bulge with mild-to-moderate bilateral facet arthropathy. No canal stenosis. Mild bilateral foraminal stenosis, right greater than left. Unchanged. L4-L5: Annular disc bulge with broad base cranially extending central disc protrusion. Moderate bilateral facet arthropathy. Left greater than right subarticular recess stenosis without canal stenosis. Severe left and moderate right foraminal stenosis. Unchanged. L5-S1: Unremarkable disc. Left worse than right facet arthropathy. No canal stenosis. Mild left foraminal stenosis. Unchanged. IMPRESSION: 1. Multilevel lumbar spondylosis, not significantly changed compared to the prior study. 2. Severe left and moderate right foraminal stenosis at L4-5. 3. Moderate-severe right foraminal stenosis at L2-3. 4. Moderate right subarticular recess  stenosis at L2-3. 5. No significant canal stenosis at any level. Electronically Signed   By: Duanne Guess D.O.   On: 11/17/2023 12:48    Recent Labs: Lab Results  Component Value Date   WBC 4.9 11/19/2023   HGB 11.3 (L) 11/19/2023   PLT  415 (H) 11/19/2023   NA 130 (L) 11/19/2023   K 4.7 11/19/2023   CL 95 (L) 11/19/2023   CO2 27 11/19/2023   GLUCOSE 89 11/19/2023   BUN 6 (L) 11/19/2023   CREATININE 0.78 11/19/2023   BILITOT 0.5 11/19/2023   ALKPHOS 137 (H) 09/19/2022   AST 48 (H) 11/19/2023   ALT 44 (H) 11/19/2023   PROT 7.0 11/19/2023   ALBUMIN 4.0 08/04/2023   CALCIUM 10.1 11/19/2023   GFRAA 94 03/10/2021    Speciality Comments: No specialty comments available.  Procedures:  Ultrasound guided injection is preferred based studies that show increased duration, increased effect, greater accuracy, decreased procedural pain, increased response rate, and decreased cost with ultrasound guided versus blind injection.   Verbal informed consent obtained.  Time-out conducted.  Noted no overlying erythema, induration, or other signs of local infection. Ultrasound-guided trigger finger injection: After sterile prep with Betadine, injected 0.5 mL of 1% lidocaine and 20 mg Kenalog using a 27-gauge needle, in the flexor tendon sheath.    Hand/UE Inj: R ring A1 for trigger finger on 12/14/2023 4:31 PM Indications: pain, tendon swelling and therapeutic Details: 27 G needle, ultrasound-guided volar approach Medications: 0.5 mL lidocaine 1 %; 20 mg triamcinolone acetonide 40 MG/ML Aspirate: 0 mL Consent was given by the patient. Immediately prior to procedure a time out was called to verify the correct patient, procedure, equipment, support staff and site/side marked as required. Patient was prepped and draped in the usual sterile fashion.     Allergies: Codeine, Myrbetriq  [mirabegron], Nystatin, Percocet [oxycodone-acetaminophen], Propoxyphene, Erythromycin base, Ketorolac,  Oxycodone-acetaminophen, Celecoxib, Darvocet [propoxyphene n-acetaminophen], Erythromycin, Hydrocodone, Nitrofurantoin, Pregabalin, Toradol [ketorolac tromethamine], Tramadol, and Verapamil   Assessment / Plan:     Visit Diagnoses: Seronegative rheumatoid arthritis (HCC)-patient complains of ongoing pain and stiffness in her hands.  She has not noticed any swelling.  She states her right middle finger has been extremely painful and is causing nocturnal pain at night.  She continues to take leflunomide 20 mg daily without any interruption.  Ultrasound examination today did not show any synovitis.  Her LFTs have been elevated.  I advised her to reduce the leflunomide to 20 mg alternating with 10 mg every other day.  If she does well we may consider reducing leflunomide to 10 mg daily.  I also discussed with the patient that leflunomide can contribute to hypertension.  Bilateral hand pain -she complains of pain and discomfort in the bilateral hands.  We obtained ultrasound examination of bilateral hands to look for synovitis.  A limited ultrasound examination was performed to evaluate bilateral second and third MCP joints.  There was no synovitis on the examination.  Plan: [place] LIMITED JOINT SPACE STRUCTURES UP BILAT  High risk medication use - Arava 20 mg 1 tablet by mouth daily.  November 19, 2023 CBC showed hemoglobin of 11.3 platelets 415.  CMP shows AST 48 and ALT 44.  Patient advised to repeat labs in 3 months which will be due in April.  Elevated LFTs-most likely due to combination of Crestor and leflunomide.  Trigger finger, left ring finger -patient complains of severe pain and discomfort in her right ring finger.  She has been experiencing nocturnal pain.  Patient requested a cortisone injection.  After informed consent was obtained the right trigger finger injection was performed as described above.  Patient tolerated the procedure well.  Postprocedure instructions were given.  A splint was applied.   Plan: US Guided Needle Placement  History of  DVT (deep vein thrombosis) - 06/23/23-Left lower extremity--taking Eliquis as prescribed.  Raynaud's phenomenon without gangrene-she continues to have Raynaud's symptoms.  Keeping core temperature warm and warm clothing was discussed.  S/P arthroscopy of left shoulder -she denied discomfort today.  Dr. Theophilus Bones on 04/15/2021.  She had left shoulder joint cortisone injection by Dr. Theophilus Bones in February 2023.  H/O total knee replacement, right-doing well.  Primary osteoarthritis of left knee-no warmth swelling or effusion was noted.  DDD (degenerative disc disease), cervical-she had limited range of motion.  DDD (degenerative disc disease), thoracic-chronic discomfort.  Degeneration of intervertebral disc of lumbar region without discogenic back pain or lower extremity pain - Status post lumbar microdiscectomy left L4-5 on November 25, 2021.  According the patient she has surgery scheduled with Dr. Yevette Edwards.  She was advised to stop Ro by week prior to the surgery and resume 2 weeks after the surgery if there is no infection and she is clearance by the surgeon.  Pseudogout-she denies any recent flares.  History of foot fracture  Other osteoporosis without current pathological fracture - DEXA 08/25/23: The BMD measured at Femur Neck Right is 0.744 g/cm2 with a T-score of -2.1.  She continues to take calcium and vitamin D.  Medication monitoring encounter - Followed by Dr. Lonzo Cloud for management of osteoporosis.  Previous therapy alendronate, Prolia IV Reclast.Received 1st dose of zoledronic acid 01/06/2022.  Fibromyalgia-she continues to have generalized pain and discomfort from fibromyalgia.  Chronic fatigue-related to fibromyalgia.  History of hypertension-blood pressure was elevated at 185/97 and repeat blood pressure was 160/77.  Patient states she has been followed closely by her PCP and cardiologist.  She will be adding new medications for  blood pressure control.  Other medical problems are listed as follows:  History of gastroesophageal reflux (GERD)  History of CHF (congestive heart failure)  IC (interstitial cystitis)  History of Clostridium difficile colitis  Orders: Orders Placed This Encounter  Procedures   Hand/UE Inj   [place] LIMITED JOINT SPACE STRUCTURES UP BILAT   US Guided Needle Placement   No orders of the defined types were placed in this encounter.    Follow-Up Instructions: Return in about 5 months (around 05/12/2024) for Rheumatoid arthritis, Osteoarthritis.   Pollyann Savoy, MD  Note - This record has been created using Animal nutritionist.  Chart creation errors have been sought, but may not always  have been located. Such creation errors do not reflect on  the standard of medical care.

## 2023-12-15 ENCOUNTER — Other Ambulatory Visit: Payer: Medicare Other | Admitting: Rheumatology

## 2023-12-15 ENCOUNTER — Other Ambulatory Visit: Payer: Self-pay | Admitting: Adult Health

## 2023-12-17 DIAGNOSIS — I1A0 Resistant hypertension: Secondary | ICD-10-CM | POA: Diagnosis not present

## 2023-12-17 DIAGNOSIS — R0602 Shortness of breath: Secondary | ICD-10-CM | POA: Diagnosis not present

## 2023-12-18 LAB — BASIC METABOLIC PANEL
BUN/Creatinine Ratio: 17 (ref 12–28)
BUN: 15 mg/dL (ref 8–27)
CO2: 22 mmol/L (ref 20–29)
Calcium: 10.4 mg/dL — ABNORMAL HIGH (ref 8.7–10.3)
Chloride: 89 mmol/L — ABNORMAL LOW (ref 96–106)
Creatinine, Ser: 0.88 mg/dL (ref 0.57–1.00)
Glucose: 66 mg/dL — ABNORMAL LOW (ref 70–99)
Potassium: 4.7 mmol/L (ref 3.5–5.2)
Sodium: 129 mmol/L — ABNORMAL LOW (ref 134–144)
eGFR: 70 mL/min/{1.73_m2} (ref >59)

## 2023-12-18 LAB — BRAIN NATRIURETIC PEPTIDE: BNP: 25.6 pg/mL (ref 0.0–100.0)

## 2023-12-19 NOTE — Progress Notes (Signed)
 Patient started on Leflunamide and whether this caused the hyponatremia not sure. But the timing of hyponatremia appears to coincide with something new stared  4-6 weeks ago

## 2023-12-20 ENCOUNTER — Ambulatory Visit: Payer: Medicare Other

## 2023-12-21 ENCOUNTER — Telehealth: Payer: Self-pay | Admitting: Adult Health

## 2023-12-21 ENCOUNTER — Encounter: Payer: Self-pay | Admitting: Family Medicine

## 2023-12-21 ENCOUNTER — Ambulatory Visit: Payer: Self-pay | Admitting: Family Medicine

## 2023-12-21 DIAGNOSIS — E861 Hypovolemia: Secondary | ICD-10-CM | POA: Diagnosis present

## 2023-12-21 DIAGNOSIS — N281 Cyst of kidney, acquired: Secondary | ICD-10-CM | POA: Diagnosis not present

## 2023-12-21 DIAGNOSIS — N3289 Other specified disorders of bladder: Secondary | ICD-10-CM | POA: Diagnosis not present

## 2023-12-21 DIAGNOSIS — Z8669 Personal history of other diseases of the nervous system and sense organs: Secondary | ICD-10-CM | POA: Diagnosis not present

## 2023-12-21 DIAGNOSIS — K529 Noninfective gastroenteritis and colitis, unspecified: Secondary | ICD-10-CM | POA: Diagnosis not present

## 2023-12-21 DIAGNOSIS — I10 Essential (primary) hypertension: Secondary | ICD-10-CM | POA: Diagnosis present

## 2023-12-21 DIAGNOSIS — R413 Other amnesia: Secondary | ICD-10-CM | POA: Diagnosis present

## 2023-12-21 DIAGNOSIS — M797 Fibromyalgia: Secondary | ICD-10-CM | POA: Diagnosis present

## 2023-12-21 DIAGNOSIS — A0811 Acute gastroenteropathy due to Norwalk agent: Secondary | ICD-10-CM | POA: Diagnosis present

## 2023-12-21 DIAGNOSIS — R251 Tremor, unspecified: Secondary | ICD-10-CM | POA: Diagnosis present

## 2023-12-21 DIAGNOSIS — E663 Overweight: Secondary | ICD-10-CM | POA: Diagnosis present

## 2023-12-21 DIAGNOSIS — M1990 Unspecified osteoarthritis, unspecified site: Secondary | ICD-10-CM | POA: Diagnosis present

## 2023-12-21 DIAGNOSIS — Z981 Arthrodesis status: Secondary | ICD-10-CM | POA: Diagnosis not present

## 2023-12-21 DIAGNOSIS — E871 Hypo-osmolality and hyponatremia: Secondary | ICD-10-CM | POA: Diagnosis present

## 2023-12-21 DIAGNOSIS — I2510 Atherosclerotic heart disease of native coronary artery without angina pectoris: Secondary | ICD-10-CM | POA: Diagnosis present

## 2023-12-21 DIAGNOSIS — G4730 Sleep apnea, unspecified: Secondary | ICD-10-CM | POA: Diagnosis present

## 2023-12-21 DIAGNOSIS — Z8679 Personal history of other diseases of the circulatory system: Secondary | ICD-10-CM | POA: Diagnosis not present

## 2023-12-21 DIAGNOSIS — Z1152 Encounter for screening for COVID-19: Secondary | ICD-10-CM | POA: Diagnosis not present

## 2023-12-21 DIAGNOSIS — Z8739 Personal history of other diseases of the musculoskeletal system and connective tissue: Secondary | ICD-10-CM | POA: Diagnosis not present

## 2023-12-21 DIAGNOSIS — E785 Hyperlipidemia, unspecified: Secondary | ICD-10-CM | POA: Diagnosis not present

## 2023-12-21 DIAGNOSIS — I471 Supraventricular tachycardia, unspecified: Secondary | ICD-10-CM | POA: Diagnosis not present

## 2023-12-21 DIAGNOSIS — M069 Rheumatoid arthritis, unspecified: Secondary | ICD-10-CM | POA: Diagnosis present

## 2023-12-21 DIAGNOSIS — Z79899 Other long term (current) drug therapy: Secondary | ICD-10-CM | POA: Diagnosis not present

## 2023-12-21 DIAGNOSIS — Z9981 Dependence on supplemental oxygen: Secondary | ICD-10-CM | POA: Diagnosis not present

## 2023-12-21 DIAGNOSIS — Z6828 Body mass index (BMI) 28.0-28.9, adult: Secondary | ICD-10-CM | POA: Diagnosis not present

## 2023-12-21 DIAGNOSIS — Z96651 Presence of right artificial knee joint: Secondary | ICD-10-CM | POA: Diagnosis present

## 2023-12-21 DIAGNOSIS — N3000 Acute cystitis without hematuria: Secondary | ICD-10-CM | POA: Diagnosis present

## 2023-12-21 DIAGNOSIS — K5730 Diverticulosis of large intestine without perforation or abscess without bleeding: Secondary | ICD-10-CM | POA: Diagnosis not present

## 2023-12-21 NOTE — Telephone Encounter (Addendum)
  Chief Complaint: Nausea Symptoms: n/v, diarrhea, headache, pallor Frequency: Constant Pertinent Negatives: Patient denies Fever Disposition: [x] ED /[] Urgent Care (no appt availability in office) / [] Appointment(In office/virtual)/ []  [facility]/ [] Home Care/ [] Refused Recommended Disposition /[] [facility]/ []  Follow-up with PCP Additional Notes: Patient called with complaints of n/v that started on Saturday. Patient states she went to a women's event on Saturday at her church and got really hot, which lead her to feeling nauseous before vomiting. Patient states she proceeded to vomit several more times that night and has been vomiting a few times a day since then. Patient states that she has vomited 3x/today and is now only vomiting bile. Patient states that she also developed diarrhea along with the vomiting on Saturday and has been going daily since, twice today. Patient states that diarrhea ranges from large to small amounts, and she is unable to keep any fluids or foods down, stating, "I'm sipping on a ginger ale now but it's coming up. I had some saltine crackers earlier but those came up." Patient states that she is weak and wobbly when walking and has required her husband's help sometimes when ambulating. Patient has been taking zofran with no effectiveness and also had complaints of a headache. Patient denies current fever and was advised by this RN that she should go to the ER per protocol, to which the patient was agreeable. Patient advised by this RN that note will be routed to office for PCP review. Patient verbalized understanding.  Copied from CRM [id number]. Topic: Clinical - Red Word Triage >> Dec 21, 2023  4:53 PM Mia [name] wrote: Red Word that prompted transfer to Nurse Triage: Since Saturday the patient has been having diarrhea, nausea and vomiting. Patient was taking Zofran for the nausea Reason for Disposition  Unable to walk, or can only walk with assistance  (e.g., requires support)  Answer Assessment - Initial Assessment Questions 1. NAUSEA SEVERITY: "How bad is the nausea?" (e.g., mild, moderate, severe; dehydration, weight loss)   - MILD: loss of appetite without change in eating habits   - MODERATE: decreased oral intake without significant weight loss, dehydration, or malnutrition   - SEVERE: inadequate caloric or fluid intake, significant weight loss, symptoms of dehydration     Severe 2. ONSET: "When did the nausea begin?"     Saturday 3. VOMITING: "Any vomiting?" If Yes, ask: "How many times today?"     Bile now, 3x/today "On Saturday I went several times and and last night." 4. RECURRENT SYMPTOM: "Have you had nausea before?" If Yes, ask: "When was the last time?" "What happened that time?"     Denies 5. CAUSE: "What do you think is causing the nausea?"      I have no idea "I was feeling fine I was at a women's even at my church then I got really hot and I threw up. That was on Saturday."  Taking Zofran, has headache, also having diarrhea (started Sunday, gone twice today).  Protocols used: Nausea-A-AH

## 2023-12-21 NOTE — Telephone Encounter (Signed)
 LVM for pt to call back and discuss vestibular function testing recommended by ENT

## 2023-12-21 NOTE — Telephone Encounter (Signed)
 Please let patient know that I did receive her ENT notes. it looks like they recommended vestibular function testing.  I am not familiar with what all this includes.  Has she completed this yet?

## 2023-12-22 DIAGNOSIS — Z8739 Personal history of other diseases of the musculoskeletal system and connective tissue: Secondary | ICD-10-CM | POA: Diagnosis not present

## 2023-12-22 DIAGNOSIS — Z79899 Other long term (current) drug therapy: Secondary | ICD-10-CM | POA: Diagnosis not present

## 2023-12-22 DIAGNOSIS — Z8669 Personal history of other diseases of the nervous system and sense organs: Secondary | ICD-10-CM | POA: Diagnosis not present

## 2023-12-22 DIAGNOSIS — I2510 Atherosclerotic heart disease of native coronary artery without angina pectoris: Secondary | ICD-10-CM | POA: Diagnosis present

## 2023-12-22 DIAGNOSIS — E663 Overweight: Secondary | ICD-10-CM | POA: Diagnosis present

## 2023-12-22 DIAGNOSIS — E871 Hypo-osmolality and hyponatremia: Secondary | ICD-10-CM | POA: Diagnosis present

## 2023-12-22 DIAGNOSIS — Z9981 Dependence on supplemental oxygen: Secondary | ICD-10-CM | POA: Diagnosis not present

## 2023-12-22 DIAGNOSIS — M797 Fibromyalgia: Secondary | ICD-10-CM | POA: Diagnosis present

## 2023-12-22 DIAGNOSIS — I471 Supraventricular tachycardia, unspecified: Secondary | ICD-10-CM | POA: Diagnosis not present

## 2023-12-22 DIAGNOSIS — I10 Essential (primary) hypertension: Secondary | ICD-10-CM | POA: Diagnosis present

## 2023-12-22 DIAGNOSIS — M069 Rheumatoid arthritis, unspecified: Secondary | ICD-10-CM | POA: Diagnosis present

## 2023-12-22 DIAGNOSIS — K529 Noninfective gastroenteritis and colitis, unspecified: Secondary | ICD-10-CM | POA: Diagnosis not present

## 2023-12-22 DIAGNOSIS — Z981 Arthrodesis status: Secondary | ICD-10-CM | POA: Diagnosis not present

## 2023-12-22 DIAGNOSIS — M1990 Unspecified osteoarthritis, unspecified site: Secondary | ICD-10-CM | POA: Diagnosis present

## 2023-12-22 DIAGNOSIS — Z8679 Personal history of other diseases of the circulatory system: Secondary | ICD-10-CM | POA: Diagnosis not present

## 2023-12-22 DIAGNOSIS — A0811 Acute gastroenteropathy due to Norwalk agent: Secondary | ICD-10-CM | POA: Diagnosis present

## 2023-12-22 DIAGNOSIS — E785 Hyperlipidemia, unspecified: Secondary | ICD-10-CM | POA: Diagnosis present

## 2023-12-22 DIAGNOSIS — E861 Hypovolemia: Secondary | ICD-10-CM | POA: Diagnosis present

## 2023-12-22 DIAGNOSIS — Z1152 Encounter for screening for COVID-19: Secondary | ICD-10-CM | POA: Diagnosis not present

## 2023-12-22 DIAGNOSIS — G4730 Sleep apnea, unspecified: Secondary | ICD-10-CM | POA: Diagnosis present

## 2023-12-22 DIAGNOSIS — R251 Tremor, unspecified: Secondary | ICD-10-CM | POA: Diagnosis present

## 2023-12-22 DIAGNOSIS — N3000 Acute cystitis without hematuria: Secondary | ICD-10-CM | POA: Diagnosis present

## 2023-12-22 DIAGNOSIS — R413 Other amnesia: Secondary | ICD-10-CM | POA: Diagnosis present

## 2023-12-22 DIAGNOSIS — Z96651 Presence of right artificial knee joint: Secondary | ICD-10-CM | POA: Diagnosis present

## 2023-12-22 DIAGNOSIS — Z6828 Body mass index (BMI) 28.0-28.9, adult: Secondary | ICD-10-CM | POA: Diagnosis not present

## 2023-12-22 NOTE — Telephone Encounter (Signed)
 LMVM for pt to return call about vestiular functioning testing.  What does this include?

## 2023-12-23 ENCOUNTER — Telehealth: Payer: Self-pay | Admitting: Family Medicine

## 2023-12-23 NOTE — Telephone Encounter (Signed)
 Pt would like a call back from [place], [place] to discuss her vestiular functioning testing

## 2023-12-23 NOTE — Telephone Encounter (Signed)
 I called pt and she stated she had seen Dr. Marzella Schlein at [facility] in [place] end last yr or this year.   Had testing done and stated was a processing problem and see neuro.  [phone] to get report.

## 2023-12-23 NOTE — Telephone Encounter (Addendum)
 I called [facility], spoke to receptionist, they will ask MR to get records to [place] at [phone] relating to vestibular functioning testing.

## 2023-12-23 NOTE — Telephone Encounter (Signed)
 Contacted [name] and updated her on CPAP machine order status. Patient aware orders were re-routed to [facility].

## 2023-12-23 NOTE — Telephone Encounter (Signed)
 Copied from CRM [id number]. Topic: General - Other >> Dec 23, 2023 11:25 AM Nila Nephew wrote: Reason for CRM: [name] Matte calling in to speak to [facility]. Refused to leave message or call back number. States will call cell phone number provided instead?

## 2023-12-24 ENCOUNTER — Ambulatory Visit: Payer: Medicare Other

## 2023-12-24 NOTE — Telephone Encounter (Signed)
 [name] states she is currently admitted to [facility] with Colitis, low potassium and sodium. [name] also states she has received her B12 injection while at the hospital.

## 2023-12-27 ENCOUNTER — Telehealth: Payer: Self-pay | Admitting: Family Medicine

## 2023-12-27 ENCOUNTER — Ambulatory Visit: Payer: Self-pay | Admitting: Licensed Clinical Social Worker

## 2023-12-27 NOTE — Telephone Encounter (Signed)
 Copied from CRM [id number]. Topic: Clinical - Prescription Issue >> Dec 24, 2023  2:10 PM Clayton Bibles wrote: Reason for CRM: [facility] needs Order for CPAP, Chart notes about why she needs CPAP. Please fax to [phone] Attn: Angelica Chessman

## 2023-12-27 NOTE — Patient Instructions (Signed)
 Visit Information  Thank you for taking time to visit with me today. Please don't hesitate to contact me if I can be of assistance to you.   Following are the goals we discussed today:   Goals Addressed             This Visit's Progress    Patient Stated she has periodic falls. No serious injuries. She gets dizzy occasionally. Left knee pain       Interventions: Spoke with client via phone today about client needs and status.  Client said she had received care for one week at hospital in [place], [place]. She said she had just returned home today from the hospital in [place], [place] She has support of her spouse and other family members Client is at risk for falls. She has cane, wheelchair and a walker to use as needed to help her walk. She said she sometimes gets dizzy when walking Client has support of PCP, Dr. Nani Gasser. Used Active Listening skills to hear client needs Discussed program support with RN, LCSW, Pharmacist. Discussed pain issues of client. Client spoke of back pain issue. She spoke of knee pain issue.  Provided counseling support for client Client has transport support from her spouse and from other family members as needed.   Thanked client for phone call today with LCSW Encouraged client or spouse to call LCSW as needed for SW support for client.at [phone]          Our next appointment is by telephone on 02/07/24 at 2:30 PM   Please call the care guide team at [phone] if you need to cancel or reschedule your appointment.   If you are experiencing a Mental Health or Behavioral Health Crisis or need someone to talk to, please go to [facility] [address], [place] [phone])   The patient verbalized understanding of instructions, educational materials, and care plan provided today and DECLINED offer to receive copy of patient instructions, educational materials, and care plan.   The patient has  been provided with contact information for the care management team and has been advised to call with any health related questions or concerns.    Lorna Few  MSW, LCSW [place] Licensed Clinical Social Worker Direct Dial:  [phone] Fax:  [phone] Website:  [email or web address]

## 2023-12-27 NOTE — Patient Outreach (Signed)
 Care Coordination   Follow Up Visit Note   12/27/2023 Name: VAUDIE ENGEBRETSEN MRN: [id number] DOB: 10/23/1951  Junius Roads Rena is a 73 y.o. year old female who sees Metheney, Barbarann Ehlers, MD for primary care. I spoke with  Junius Roads Riedesel by phone today.  What matters to the patients health and wellness today? Patient Stated she has periodic falls. No serious injuries. She gets dizzy occasionally. Left knee pain     Goals Addressed             This Visit's Progress    Patient Stated she has periodic falls. No serious injuries. She gets dizzy occasionally. Left knee pain       Interventions: Spoke with client via phone today about client needs and status.  Client said she had received care for one week at hospital in [place], [place]. She said she had just returned home today from the hospital in [place], [place] She has support of her spouse and other family members Client is at risk for falls. She has cane, wheelchair and a walker to use as needed to help her walk. She said she sometimes gets dizzy when walking Client has support of PCP, Dr. Nani Gasser. Used Active Listening skills to hear client needs Discussed program support with RN, LCSW, Pharmacist. Discussed pain issues of client. Client spoke of back pain issue. She spoke of knee pain issue.  Provided counseling support for client Client has transport support from her spouse and from other family members as needed.   Thanked client for phone call today with LCSW Encouraged client or spouse to call LCSW as needed for SW support for client.at [phone]          SDOH assessments and interventions completed:  Yes  SDOH Interventions Today    Flowsheet Row Most Recent Value  SDOH Interventions   Physical Activity Interventions Other (Comments)  [risk for falls. Has wheelchair , walker and cane to use as needed to help with ambulation]  Stress Interventions Provide Counseling  [client has stress in managing medical needs.  She is at risk for falls. Needs help with ADLs]        Care Coordination Interventions:  Yes, provided   Interventions Today    Flowsheet Row Most Recent Value  Chronic Disease   Chronic disease during today's visit Other  [spoke with client about client needs]  General Interventions   General Interventions Discussed/Reviewed General Interventions Discussed, Community Resources  Education Interventions   Education Provided Provided Education  Provided Verbal Education On [employer]  Mental Health Interventions   Mental Health Discussed/Reviewed Coping Strategies  [client has anxiety and stress related to managing medical needs of client. she is at risk for falls. Has family support]  Nutrition Interventions   Nutrition Discussed/Reviewed Nutrition Discussed  Pharmacy Interventions   Pharmacy Dicussed/Reviewed Pharmacy Topics Discussed  Safety Interventions   Safety Discussed/Reviewed Fall Risk        Follow up plan: Follow up call scheduled for 02/07/24 at 2:30 PM     Encounter Outcome:  Patient Visit Completed    Lorna Few  MSW, LCSW [place] Licensed Clinical Social Worker Direct Dial:  [phone] Fax:  [phone] Website:  [email or web address]

## 2023-12-28 ENCOUNTER — Telehealth: Payer: Self-pay

## 2023-12-28 NOTE — Transitions of Care (Post Inpatient/ED Visit) (Deleted)
 12/28/2023  Name: Mia [name] MRN: [id number] DOB: January 14, 1951  Today's TOC FU Call Status: Today's TOC FU Call Status:: Successful TOC FU Call Completed TOC FU Call Complete Date: 12/28/23 [name] and Date of Birth confirmed.  Transition Care Management Follow-up Telephone Call Date of Discharge: 12/27/23 Discharge Facility: Other [facility]) Name of Other (Non[facility]) Discharge Facility: [facility] Type of Discharge: Inpatient Admission Primary Inpatient Discharge Diagnosis:: Nausea and vomiting, unspecified vomiting type How have you been since you were released from the hospital?: Better Any questions or concerns?: No (Patient denies)  Items Reviewed: Did you receive and understand the discharge instructions provided?: Yes (patient denies any questions) Medications obtained,verified, and reconciled?: Yes (Medications Reviewed) Any new allergies since your discharge?: No Dietary orders reviewed?: Yes Type of Diet Ordered:: patient reports low fiber and avoid fried or raw foods Do you have support at home?: Yes People in Home: spouse Name of Support/Comfort Primary Source: husband helps and granddaughter 21 years stays with patient and helps as needed  Medications Reviewed Today: Medications Reviewed Today     Reviewed by Jessy Oto, RN (Registered Nurse) on 12/28/23 at 1236  Med List Status: <None>   Medication Order Taking? Sig Documenting Provider Last Dose Status Informant  acetaminophen (TYLENOL) 500 MG tablet [id number] Yes Take 2,000 mg by mouth every 6 (six) hours as needed (pain.). [provider] Taking Active Self  albuterol (VENTOLIN HFA) 108 (90 Base) MCG/ACT inhaler [id number] Yes Inhale 1-2 puffs into the lungs every 6 (six) hours as needed for shortness of breath or wheezing. [provider] Taking Active Self  AMBULATORY NON FORMULARY MEDICATION [id number] No Medication Name: CPAP with humidifier set to  5 to 20 cm of water pressure.  Fullface mask.  Please fax download back to [phone] 3:06 weeks.  Patient not taking: Reported on 12/28/2023   Agapito Games, MD Not Taking Active            Med Note [facility] [place] [facility]   Tue Dec 28, 2023 12:25 PM) Patient states this is on order but has not received it yet  amLODipine (NORVASC) 5 MG tablet [id number] Yes Take 1 tablet (5 mg total) by mouth daily.  Patient taking differently: Take 5 mg by mouth daily at 6 PM. (1700)   Nahser, Deloris Ping, MD Taking Active Self  cetirizine (ZYRTEC) 10 MG tablet [id number] No Take 10 mg by mouth in the morning.  Patient not taking: Reported on 12/28/2023   [provider] Not Taking Active Self  cholecalciferol (VITAMIN D3) 25 MCG (1000 UNIT) tablet [id number] No Take 1,000 Units by mouth daily.  Patient not taking: Reported on 12/28/2023   [provider] Not Taking Active   ciprofloxacin (CIPRO) 500 MG tablet [id number] Yes Take 500 mg by mouth 2 (two) times daily. [provider] Taking Active   colestipol (COLESTID) 1 g tablet [id number] Yes Take 1 g by mouth daily at 12 noon. (1100) [provider] Taking Active Self  cyanocobalamin (VITAMIN B12) 1000 MCG/ML injection [id number] Yes Inject 1,000 mcg into the muscle every 28 (twenty-eight) days. [provider] Taking Active Self  donepezil (ARICEPT) 10 MG tablet [id number] Yes TAKE 1 TABLET IN THE EVENING AT BEDTIME Butch Penny, NP Taking Active Self  gabapentin (NEURONTIN) 300 MG capsule [id number] Yes TAKE 1 CAPSULE BY MOUTH EVERY MORNING AND AT BEDTIME Agapito Games, MD Taking Active   ipratropium (ATROVENT) 0.06 % nasal spray [id number] Yes Place  2 sprays into both nostrils 2 (two) times daily as needed for rhinitis. [provider] Taking Active Self  leflunomide (ARAVA) 20 MG tablet [id number] Yes TAKE 1 TABLET BY MOUTH EVERY DAY Pollyann Savoy, MD Taking Active   Melatonin 10 MG CAPS [id number]  Yes Take 10 mg by mouth at bedtime. [provider] Taking Active Self  memantine (NAMENDA) 10 MG tablet [id number] Yes TAKE 1 TABLET BY MOUTH TWO TIMES A DAY ***MUST SEE DR FOR FURTHER REFILLS*** Butch Penny, NP Taking Active   metoprolol tartrate (LOPRESSOR) 100 MG tablet [id number] Yes TAKE 1 TABLET BY MOUTH TWICE A DAY Marinus Maw, MD Taking Active   metroNIDAZOLE (FLAGYL) 500 MG tablet [id number] Yes Take 500 mg by mouth 3 (three) times daily. [provider] Taking Active   ondansetron (ZOFRAN-ODT) 8 MG disintegrating tablet [id number] Yes Take 8 mg by mouth every 8 (eight) hours as needed for nausea or vomiting. [provider] Taking Active Self  pantoprazole (PROTONIX) 20 MG tablet [id number] Yes Take 20 mg by mouth daily before breakfast. [provider] Taking Active Self  primidone (MYSOLINE) 50 MG tablet [id number] Yes TAKE 1 TABLET BY MOUTH EVERYDAY AT BEDTIME Huston Foley, MD Taking Active   Probiotic Product (ALIGN PO) [id number] No Take 1 capsule by mouth in the morning.  Patient not taking: Reported on 12/28/2023   [provider] Not Taking Active Self  rosuvastatin (CRESTOR) 5 MG tablet [id number] Yes Take 1 tablet (5 mg total) by mouth daily. Sharlene Dory, PA-C Taking Active   sodium chloride (MURO 128) 5 % ophthalmic solution [id number] No Place 1 drop into both eyes in the morning, at noon, in the evening, and at bedtime.  Patient not taking: Reported on 12/28/2023   [provider] Not Taking Active Self  triamcinolone cream (KENALOG) 0.1 % [id number] No Apply 1 Application topically 2 (two) times daily.  Patient not taking: Reported on 12/28/2023   Agapito Games, MD Not Taking Active   triamterene-hydrochlorothiazide [facility]) 37.5-25 MG tablet [id number] No Take 1 tablet by mouth in the morning.  Patient not taking: Reported on 12/28/2023   Yates Decamp, MD Not Taking Active   valsartan (DIOVAN) 160 MG tablet  [id number] Yes TAKE 1 TABLET BY MOUTH 2 TIMES DAILY. Nahser, Deloris Ping, MD Taking Active Self  venlafaxine XR (EFFEXOR-XR) 75 MG 24 hr capsule [id number] Yes TAKE 1 CAPSULE DAILY WITH BREAKFAST Millikan, Megan, NP Taking Active Self  Vitamin D, Ergocalciferol, (DRISDOL) 1.25 MG (50000 UNIT) CAPS capsule [id number]  Take 1 capsule (50,000 Units total) by mouth every 7 (seven) days.  Patient not taking: Reported on 12/14/2023   Agapito Games, MD  Consider Medication Status and Discontinue (Duplicate)             Home Care and Equipment/Supplies: Were Home Health Services Ordered?: Yes Name of Home Health Agency:: [facility] PT Has Agency set up a time to come to your home?: Yes First Home Health Visit Date:  (Patient states she has spoken with them and is waiting on a call back to schedule first visit) Any new equipment or medical supplies ordered?: Yes Name of Medical supply agency?: [employer] Were you able to get the equipment/medical supplies?: Yes (Patient states she was discharged with rollator) Do you have any questions related to the use of the equipment/supplies?: No  Functional Questionnaire: Do you need assistance with bathing/showering or dressing?: No Do you need assistance with meal preparation?: Yes (husband is  assisting) Do you need assistance with eating?: No Do you need assistance with getting out of bed/getting out of a chair/moving?: No Do you have difficulty managing or taking your medications?: No  Follow up appointments reviewed: PCP Follow-up appointment confirmed?: No (Patient was educated on importance of PCP follow up and TOC RN offered to assist with scheduling and patient states she will call) MD Provider Line Number:[phone] Given: No Specialist Hospital Follow-up appointment confirmed?: Yes Date of Specialist follow-up appointment?: 12/31/23 Follow-Up Specialty Provider:: Cardiology Do you need transportation to your follow-up  appointment?: No (husband will take her to appointments) Do you understand care options if your condition(s) worsen?: Yes-patient verbalized understanding  SDOH Interventions Today    Flowsheet Row Most Recent Value  SDOH Interventions   Food Insecurity Interventions Intervention Not Indicated  Housing Interventions Intervention Not Indicated  Transportation Interventions Intervention Not Indicated  Utilities Interventions Intervention Not Indicated       Goals Addressed               This Visit's Progress     TOC Care Plan - Patient will report no readmissions in the next 30 days (pt-stated)        Current Barriers:  Patient was unaware of need to schedule PCP hospital follow up  Patient unaware if she should postpone her planned 01/12/24 lumbar surgery  RNCM Clinical Goal(s):  Patient will work with the Care Management team over the next 30 days to address Transition of Care Barriers:PCP hospital follow up and plan for lumbar surgery Patient will take all medications exactly as prescribed and will call provider for medication related questions as evidenced by patient report and health record  Patient will attend all scheduled medical appointments: cardiology appointment 12/31/23 as evidenced by patient scheduling PCP follow up appointment  Interventions: Evaluation of current treatment plan related to  self management and patient's adherence to plan as established by provider  Transitions of Care:  New goal. Doctor Visits  - discussed the importance of doctor visits  Patient Goals/Self-Care Activities: Participate in Transition of Care Program/Attend [facility] scheduled calls Notify RN Care Manager of [facility] call rescheduling needs Take all medications as prescribed Attend all scheduled provider appointments - patient to call and schedule PCP hospital follow up Patient will call ortho surgeon to see if MD recommends patient postpone 01/12/24 lumbar surgery  Follow Up Plan:   Telephone follow up appointment with care management team member scheduled for:  01/04/24 10 am  The patient has been provided with contact information for the care management team and has been advised to call with any health related questions or concerns.          Hilbert Odor RN, CCM [place]  VBCI-Population Health RN Care Manager [phone]

## 2023-12-28 NOTE — Transitions of Care (Post Inpatient/ED Visit) (Signed)
 12/28/2023  Name: Mia [name] MRN: [id number] DOB: January 14, 1951  Today's TOC FU Call Status: Today's TOC FU Call Status:: Successful TOC FU Call Completed TOC FU Call Complete Date: 12/28/23 [name] and Date of Birth confirmed.  Transition Care Management Follow-up Telephone Call Date of Discharge: 12/27/23 Discharge Facility: Other [facility]) Name of Other (Non-[facility]) Discharge Facility: [facility] Type of Discharge: Inpatient Admission Primary Inpatient Discharge Diagnosis:: Nausea and vomiting, unspecified vomiting type How have you been since you were released from the hospital?: Better Any questions or concerns?: No (Patient denies)  Items Reviewed: Did you receive and understand the discharge instructions provided?: Yes (patient denies any questions) Medications obtained,verified, and reconciled?: Yes (Medications Reviewed) Any new allergies since your discharge?: No Dietary orders reviewed?: Yes Type of Diet Ordered:: patient reports low fiber and avoid fried or raw foods Do you have support at home?: Yes People in Home: spouse Name of Support/Comfort Primary Source: husband helps and granddaughter 21 years stays with patient and helps as needed  Medications Reviewed Today: Medications Reviewed Today     Reviewed by Jessy Oto, RN (Registered Nurse) on 12/28/23 at 1236  Med List Status: <None>   Medication Order Taking? Sig Documenting Provider Last Dose Status Informant  acetaminophen (TYLENOL) 500 MG tablet [id number] Yes Take 2,000 mg by mouth every 6 (six) hours as needed (pain.). [provider] Taking Active Self  albuterol (VENTOLIN HFA) 108 (90 Base) MCG/ACT inhaler [id number] Yes Inhale 1-2 puffs into the lungs every 6 (six) hours as needed for shortness of breath or wheezing. [provider] Taking Active Self  AMBULATORY NON FORMULARY MEDICATION [id number] No Medication Name: CPAP with humidifier set to  5 to 20 cm of water pressure.  Fullface mask.  Please fax download back to [phone] 3:06 weeks.  Patient not taking: Reported on 12/28/2023   Agapito Games, MD Not Taking Active            Med Note [facility] [place] A   Tue Dec 28, 2023 12:25 PM) Patient states this is on order but has not received it yet  amLODipine (NORVASC) 5 MG tablet [id number] Yes Take 1 tablet (5 mg total) by mouth daily.  Patient taking differently: Take 5 mg by mouth daily at 6 PM. (1700)   Nahser, Deloris Ping, MD Taking Active Self  cetirizine (ZYRTEC) 10 MG tablet [id number] No Take 10 mg by mouth in the morning.  Patient not taking: Reported on 12/28/2023   [provider] Not Taking Active Self  cholecalciferol (VITAMIN D3) 25 MCG (1000 UNIT) tablet [id number] No Take 1,000 Units by mouth daily.  Patient not taking: Reported on 12/28/2023   [provider] Not Taking Active   ciprofloxacin (CIPRO) 500 MG tablet [id number] Yes Take 500 mg by mouth 2 (two) times daily. [provider] Taking Active   colestipol (COLESTID) 1 g tablet [id number] Yes Take 1 g by mouth daily at 12 noon. (1100) [provider] Taking Active Self  cyanocobalamin (VITAMIN B12) 1000 MCG/ML injection [id number] Yes Inject 1,000 mcg into the muscle every 28 (twenty-eight) days. [provider] Taking Active Self  donepezil (ARICEPT) 10 MG tablet [id number] Yes TAKE 1 TABLET IN THE EVENING AT BEDTIME Butch Penny, NP Taking Active Self  gabapentin (NEURONTIN) 300 MG capsule [id number] Yes TAKE 1 CAPSULE BY MOUTH EVERY MORNING AND AT BEDTIME Agapito Games, MD Taking Active   ipratropium (ATROVENT) 0.06 % nasal spray [id number] Yes Place  2 sprays into both nostrils 2 (two) times daily as needed for rhinitis. [provider] Taking Active Self  leflunomide (ARAVA) 20 MG tablet [id number] Yes TAKE 1 TABLET BY MOUTH EVERY DAY Pollyann Savoy, MD Taking Active   Melatonin 10 MG CAPS [id number]  Yes Take 10 mg by mouth at bedtime. [provider] Taking Active Self  memantine (NAMENDA) 10 MG tablet [id number] Yes TAKE 1 TABLET BY MOUTH TWO TIMES A DAY ***MUST SEE DR FOR FURTHER REFILLS*** Butch Penny, NP Taking Active   metoprolol tartrate (LOPRESSOR) 100 MG tablet [id number] Yes TAKE 1 TABLET BY MOUTH TWICE A DAY Marinus Maw, MD Taking Active   metroNIDAZOLE (FLAGYL) 500 MG tablet [id number] Yes Take 500 mg by mouth 3 (three) times daily. [provider] Taking Active   ondansetron (ZOFRAN-ODT) 8 MG disintegrating tablet [id number] Yes Take 8 mg by mouth every 8 (eight) hours as needed for nausea or vomiting. [provider] Taking Active Self  pantoprazole (PROTONIX) 20 MG tablet [id number] Yes Take 20 mg by mouth daily before breakfast. [provider] Taking Active Self  primidone (MYSOLINE) 50 MG tablet [id number] Yes TAKE 1 TABLET BY MOUTH EVERYDAY AT BEDTIME Huston Foley, MD Taking Active   Probiotic Product (ALIGN PO) [id number] No Take 1 capsule by mouth in the morning.  Patient not taking: Reported on 12/28/2023   [provider] Not Taking Active Self  rosuvastatin (CRESTOR) 5 MG tablet [id number] Yes Take 1 tablet (5 mg total) by mouth daily. Sharlene Dory, PA-C Taking Active   sodium chloride (MURO 128) 5 % ophthalmic solution [id number] No Place 1 drop into both eyes in the morning, at noon, in the evening, and at bedtime.  Patient not taking: Reported on 12/28/2023   [provider] Not Taking Active Self  triamcinolone cream (KENALOG) 0.1 % [id number] No Apply 1 Application topically 2 (two) times daily.  Patient not taking: Reported on 12/28/2023   Agapito Games, MD Not Taking Active   triamterene-hydrochlorothiazide [facility]) 37.5-25 MG tablet [id number] No Take 1 tablet by mouth in the morning.  Patient not taking: Reported on 12/28/2023   Yates Decamp, MD Not Taking Active   valsartan (DIOVAN) 160 MG tablet  [id number] Yes TAKE 1 TABLET BY MOUTH 2 TIMES DAILY. Nahser, Deloris Ping, MD Taking Active Self  venlafaxine XR (EFFEXOR-XR) 75 MG 24 hr capsule [id number] Yes TAKE 1 CAPSULE DAILY WITH BREAKFAST [place]ikan, Megan, NP Taking Active Self  Vitamin D, Ergocalciferol, (DRISDOL) 1.25 MG (50000 UNIT) CAPS capsule [id number]  Take 1 capsule (50,000 Units total) by mouth every 7 (seven) days.  Patient not taking: Reported on 12/14/2023   Agapito Games, MD  Consider Medication Status and Discontinue (Duplicate)             Home Care and Equipment/Supplies: Were Home Health Services Ordered?: Yes Name of Home Health Agency:: [facility] PT Has Agency set up a time to come to your home?: Yes First Home Health Visit Date:  (Patient states she has spoken with them and is waiting on a call back to schedule first visit) Any new equipment or medical supplies ordered?: Yes Name of Medical supply agency?: [employer] Were you able to get the equipment/medical supplies?: Yes (Patient states she was discharged with rollator) Do you have any questions related to the use of the equipment/supplies?: No  Functional Questionnaire: Do you need assistance with bathing/showering or dressing?: No Do you need assistance with meal preparation?: Yes (husband is  assisting) Do you need assistance with eating?: No Do you need assistance with getting out of bed/getting out of a chair/moving?: No Do you have difficulty managing or taking your medications?: No  Follow up appointments reviewed: PCP Follow-up appointment confirmed?: No (Patient was educated on importance of PCP follow up and TOC RN offered to assist with scheduling and patient states she will call) MD Provider Line Number:[phone] Given: No Specialist Hospital Follow-up appointment confirmed?: Yes Date of Specialist follow-up appointment?: 12/31/23 Follow-Up Specialty Provider:: Cardiology Do you need transportation to your follow-up  appointment?: No (husband will take her to appointments) Do you understand care options if your condition(s) worsen?: Yes-patient verbalized understanding  SDOH Interventions Today    Flowsheet Row Most Recent Value  SDOH Interventions   Food Insecurity Interventions Intervention Not Indicated  Housing Interventions Intervention Not Indicated  Transportation Interventions Intervention Not Indicated  Utilities Interventions Intervention Not Indicated       Goals Addressed               This Visit's Progress     TOC Care Plan - Patient will report no readmissions in the next 30 days (pt-stated)        Current Barriers:  Patient was unaware of need to schedule PCP hospital follow up  Patient unaware if she should postpone her planned 01/12/24 lumbar surgery  RNCM Clinical Goal(s):  Patient will work with the Care Management team over the next 30 days to address Transition of Care Barriers:PCP hospital follow up and plan for lumbar surgery Patient will take all medications exactly as prescribed and will call provider for medication related questions as evidenced by patient report and health record  Patient will attend all scheduled medical appointments: cardiology appointment 12/31/23 as evidenced by patient scheduling PCP follow up appointment  Interventions: Evaluation of current treatment plan related to  self management and patient's adherence to plan as established by provider  Transitions of Care:  New goal. Doctor Visits  - discussed the importance of doctor visits  Patient Goals/Self-Care Activities: Participate in Transition of Care Program/Attend [facility] scheduled calls Notify RN Care Manager of [facility] call rescheduling needs Take all medications as prescribed Attend all scheduled provider appointments - patient to call and schedule PCP hospital follow up Patient will call ortho surgeon to see if MD recommends patient postpone 01/12/24 lumbar surgery  Follow Up Plan:   Telephone follow up appointment with care management team member scheduled for:  01/04/24 10 am  The patient has been provided with contact information for the care management team and has been advised to call with any health related questions or concerns.          Hilbert Odor RN, CCM [place]  VBCI-Population Health RN Care Manager [phone]

## 2023-12-29 NOTE — Telephone Encounter (Signed)
 Received frp, [facility] record for diagnostic testing.

## 2023-12-30 ENCOUNTER — Encounter: Payer: Self-pay | Admitting: Emergency Medicine

## 2023-12-30 DIAGNOSIS — Z96651 Presence of right artificial knee joint: Secondary | ICD-10-CM | POA: Diagnosis not present

## 2023-12-30 DIAGNOSIS — M1990 Unspecified osteoarthritis, unspecified site: Secondary | ICD-10-CM | POA: Diagnosis not present

## 2023-12-30 DIAGNOSIS — G43909 Migraine, unspecified, not intractable, without status migrainosus: Secondary | ICD-10-CM | POA: Diagnosis not present

## 2023-12-30 DIAGNOSIS — I2510 Atherosclerotic heart disease of native coronary artery without angina pectoris: Secondary | ICD-10-CM | POA: Diagnosis not present

## 2023-12-30 DIAGNOSIS — Z981 Arthrodesis status: Secondary | ICD-10-CM | POA: Diagnosis not present

## 2023-12-30 DIAGNOSIS — E079 Disorder of thyroid, unspecified: Secondary | ICD-10-CM | POA: Diagnosis not present

## 2023-12-30 DIAGNOSIS — E119 Type 2 diabetes mellitus without complications: Secondary | ICD-10-CM | POA: Diagnosis not present

## 2023-12-30 DIAGNOSIS — Z87440 Personal history of urinary (tract) infections: Secondary | ICD-10-CM | POA: Diagnosis not present

## 2023-12-30 DIAGNOSIS — E871 Hypo-osmolality and hyponatremia: Secondary | ICD-10-CM | POA: Diagnosis not present

## 2023-12-30 DIAGNOSIS — Z860100 Personal history of colon polyps, unspecified: Secondary | ICD-10-CM | POA: Diagnosis not present

## 2023-12-30 DIAGNOSIS — I5032 Chronic diastolic (congestive) heart failure: Secondary | ICD-10-CM | POA: Diagnosis not present

## 2023-12-30 DIAGNOSIS — Z9181 History of falling: Secondary | ICD-10-CM | POA: Diagnosis not present

## 2023-12-30 DIAGNOSIS — M069 Rheumatoid arthritis, unspecified: Secondary | ICD-10-CM | POA: Diagnosis not present

## 2023-12-30 DIAGNOSIS — G4730 Sleep apnea, unspecified: Secondary | ICD-10-CM | POA: Diagnosis not present

## 2023-12-30 DIAGNOSIS — M797 Fibromyalgia: Secondary | ICD-10-CM | POA: Diagnosis not present

## 2023-12-30 DIAGNOSIS — I110 Hypertensive heart disease with heart failure: Secondary | ICD-10-CM | POA: Diagnosis not present

## 2023-12-30 DIAGNOSIS — E785 Hyperlipidemia, unspecified: Secondary | ICD-10-CM | POA: Diagnosis not present

## 2023-12-30 DIAGNOSIS — I471 Supraventricular tachycardia, unspecified: Secondary | ICD-10-CM | POA: Diagnosis not present

## 2023-12-31 ENCOUNTER — Ambulatory Visit: Payer: Medicare Other | Admitting: Physician Assistant

## 2024-01-03 NOTE — Telephone Encounter (Signed)
 It looks like one recommendation they had was vestibular rehab with focus on ocular motor function and habituation exercises.  You can ask if she wants a referral to vestibular rehab?  Please let patient know also let Dr. Frances Furbish review the results.  Dr. Frances Furbish I will put these results in your box

## 2024-01-04 ENCOUNTER — Telehealth: Payer: Self-pay

## 2024-01-04 ENCOUNTER — Other Ambulatory Visit: Payer: Self-pay

## 2024-01-04 ENCOUNTER — Encounter: Payer: Self-pay | Admitting: *Deleted

## 2024-01-04 NOTE — Telephone Encounter (Signed)
 I have placed the testing in your inbox to review.

## 2024-01-04 NOTE — Telephone Encounter (Signed)
 I reviewed audiology records.  I recommend follow-up with ENT.

## 2024-01-04 NOTE — Telephone Encounter (Signed)
 I am happy to review records.  If vestibular rehab was recommended by ENT, would recommend that patient request referral from ENT as they had some specific recommendations.

## 2024-01-04 NOTE — Patient Instructions (Signed)
 Visit Information  Thank you for taking time to visit with me today. Please don't hesitate to contact me if I can be of assistance to you before our next scheduled telephone appointment.  Our next appointment is by telephone on 01/12/24 at 2pm  Following is a copy of your care plan:   Goals Addressed               This Visit's Progress     TOC Care Plan - Patient will report no readmissions in the next 30 days (pt-stated)        Current Barriers:  Patient was unaware of need to schedule PCP hospital follow up 01/04/24 Follow up appointment with PCP is scheduled 01/12/24 Patient unaware if she should postpone her planned 01/12/24 lumbar surgery - 01/04/24 surgery currently cancelled  RNCM Clinical Goal(s):  Patient will work with the Care Management team over the next 30 days to address Transition of Care Barriers:PCP hospital follow up and plan for lumbar surgery 01/04/24 Follow up scheduled and surgery cancelled  Patient will take all medications exactly as prescribed and will call provider for medication related questions as evidenced by patient report and health record - 01/04/24 reconciled and patient reported new medication that was added to list  Patient will attend all scheduled medical appointments: cardiology appointment 12/31/23 as evidenced by patient scheduling PCP follow up appointment - 01/04/24 Patient reports 01/12/24 PCP hospital follow up  - cardiology appointment was not discussed - will discuss in future call   Interventions: Evaluation of current treatment plan related to  self management and patient's adherence to plan as established by provider 01/04/24 ONGOING  Transitions of Care:  New goal. Doctor Visits  - discussed the importance of doctor visits - 01/04/24 Patient has 01/12/24 hospital follow up with PCP   Patient Goals/Self-Care Activities: Participate in Transition of Care Program/Attend [facility] scheduled calls Notify RN Care Manager of TOC call rescheduling needs Take  all medications as prescribed - 01/04/24 medications reviewed and Sucralfate added to medication list Attend all scheduled provider appointments - patient to call and schedule PCP hospital follow up 01/04/24 - patient has PCP hospital follow up 01/12/24  Patient will call ortho surgeon to see if MD recommends patient postpone 01/12/24 lumbar surgery - 01/04/24 surgery is canceled at this time Patient will continue using rollator as directed  Follow Up Plan:  Telephone follow up appointment with care management team member scheduled for:  01/12/24 2pm  The patient has been provided with contact information for the care management team and has been advised to call with any health related questions or concerns.          Patient verbalizes understanding of instructions and care plan provided today and agrees to view in MyChart. Active MyChart status and patient understanding of how to access instructions and care plan via MyChart confirmed with patient.     Telephone follow up appointment with care management team member scheduled for: 01/12/24 2pm The patient has been provided with contact information for the care management team and has been advised to call with any health related questions or concerns.   Please call the care guide team at [phone] if you need to cancel or reschedule your appointment.   Please call the Suicide and Crisis Lifeline: 988 call the [place] National Suicide Prevention Lifeline: 310-018-0267 or TTY: 629-366-2397 TTY 408-865-3259) to talk to a trained counselor call 1-800-273-TALK (toll free, 24 hour hotline) call 911 if you are experiencing a Mental Health  or Behavioral Health Crisis or need someone to talk to.  Hilbert Odor RN, CCM [place]  VBCI-Population Health RN Care Manager [phone]

## 2024-01-04 NOTE — Patient Outreach (Signed)
 Care Management  Transitions of Care Program Transitions of Care Post-discharge week 2   01/04/2024 Name: Mia [name] MRN: [id number] DOB: January 31, 1951  Subjective: Mia [name] is a 73 y.o. year old female who is a primary care patient of Agapito Games, MD. The Care Management team Engaged with patient Engaged with patient by telephone to assess and address transitions of care needs.   Consent to Services:  Patient was given information about care management services, agreed to services, and gave verbal consent to participate.   Assessment:     Patient reports she is doing well and states she contacted GI, Dr Caryl Never regarding her GI burning and was given Sucralfate which she reports is helping.   Patient has PCP hospital follow up scheduled 01/12/24 and has canceled the pending lumbar surgery. Patient reports diarrhea has resolved and reports no BM for 3 days. Reviewed with patient to monitor closely and contact PCP if she needs a stool softener.     SDOH Interventions    Flowsheet Row Telephone from 12/28/2023 in [place] POPULATION HEALTH DEPARTMENT Care Coordination from 12/27/2023 in [facility] Care Coordination Care Coordination from 11/16/2023 in [facility] Care Coordination Care Coordination from 10/25/2023 in [facility] Care Coordination Care Coordination from 09/14/2023 in [facility] Care Coordination Care Coordination from 07/12/2023 in [facility] Care Coordination  SDOH Interventions        Food Insecurity Interventions Intervention Not Indicated -- -- -- -- --  Housing Interventions Intervention Not Indicated -- -- -- -- --  Transportation Interventions Intervention Not Indicated -- -- -- -- --  Utilities Interventions Intervention Not Indicated -- -- -- -- --  Physical Activity Interventions -- Other (Comments)  [risk for falls. Has wheelchair , walker and  cane to use as needed to help with ambulation] Other (Comments)  [at risk for falls. she has cane, wheelchair, and walker to use as needed to help her walk] Other (Comments)  [does have knee pain issues. Fall risk] Other (Comments)  [fall risk. has walking difficulty] Other (Comments)  [sometimes uses a walker to help her walk]  Stress Interventions -- Provide Counseling  [client has stress in managing medical needs. She is at risk for falls. Needs help with ADLs] Provide Counseling  [client has knee pain issues and back pain issues. She has MRI scheduled for tomorrow. she has stress in managing medical needs] Other (Comment)  [has stress in managing medical needs] Provide Counseling  [has stress in managing medical needs] Other (Comment)  [has stress occasionally in managing her medical needs]        Goals Addressed               This Visit's Progress     TOC Care Plan - Patient will report no readmissions in the next 30 days (pt-stated)        Current Barriers:  Patient was unaware of need to schedule PCP hospital follow up 01/04/24 Follow up appointment with PCP is scheduled 01/12/24 Patient unaware if she should postpone her planned 01/12/24 lumbar surgery - 01/04/24 surgery currently cancelled  RNCM Clinical Goal(s):  Patient will work with the Care Management team over the next 30 days to address Transition of Care Barriers:PCP hospital follow up and plan for lumbar surgery 01/04/24 Follow up scheduled and surgery cancelled  Patient will take all medications exactly as prescribed and will call provider for medication related questions as evidenced by  patient report and health record - 01/04/24 reconciled and patient reported new medication that was added to list  Patient will attend all scheduled medical appointments: cardiology appointment 12/31/23 as evidenced by patient scheduling PCP follow up appointment - 01/04/24 Patient reports 01/12/24 PCP hospital follow up  - cardiology appointment was  not discussed - will discuss in future call   Interventions: Evaluation of current treatment plan related to  self management and patient's adherence to plan as established by provider 01/04/24 ONGOING  Transitions of Care:  New goal. Doctor Visits  - discussed the importance of doctor visits - 01/04/24 Patient has 01/12/24 hospital follow up with PCP   Patient Goals/Self-Care Activities: Participate in Transition of Care Program/Attend [facility] scheduled calls Notify RN Care Manager of TOC call rescheduling needs Take all medications as prescribed - 01/04/24 medications reviewed and Sucralfate added to medication list Attend all scheduled provider appointments - patient to call and schedule PCP hospital follow up 01/04/24 - patient has PCP hospital follow up 01/12/24  Patient will call ortho surgeon to see if MD recommends patient postpone 01/12/24 lumbar surgery - 01/04/24 surgery is canceled at this time Patient will continue using rollator as directed  Follow Up Plan:  Telephone follow up appointment with care management team member scheduled for:  01/12/24 2pm  The patient has been provided with contact information for the care management team and has been advised to call with any health related questions or concerns.          Plan: Telephone follow up appointment with care management team member scheduled for: 01/12/24  The patient has been provided with contact information for the care management team and has been advised to call with any health related questions or concerns.   Hilbert Odor RN, CCM [place]  VBCI-Population Health RN Care Manager [phone]

## 2024-01-05 DIAGNOSIS — I471 Supraventricular tachycardia, unspecified: Secondary | ICD-10-CM | POA: Diagnosis not present

## 2024-01-05 DIAGNOSIS — I5032 Chronic diastolic (congestive) heart failure: Secondary | ICD-10-CM | POA: Diagnosis not present

## 2024-01-05 DIAGNOSIS — I2510 Atherosclerotic heart disease of native coronary artery without angina pectoris: Secondary | ICD-10-CM | POA: Diagnosis not present

## 2024-01-05 DIAGNOSIS — E119 Type 2 diabetes mellitus without complications: Secondary | ICD-10-CM | POA: Diagnosis not present

## 2024-01-05 DIAGNOSIS — I110 Hypertensive heart disease with heart failure: Secondary | ICD-10-CM | POA: Diagnosis not present

## 2024-01-05 DIAGNOSIS — M797 Fibromyalgia: Secondary | ICD-10-CM | POA: Diagnosis not present

## 2024-01-05 NOTE — Telephone Encounter (Signed)
 Pt called and was following up on the message sent to her, to see f/u ENT for vestibular rehab that was requested by ENT.  Pt was confused as the Ent said to see neuro.   I then relayed that Aundra Millet, NP had recommended to pt that :   It looks like one recommendation they had was vestibular rehab with focus on ocular motor function and habituation exercises.  You can ask if she wants a referral to vestibular rehab?  I told pt that I had sent the response from Dr. Frances Furbish but not Megan/NP's and I apologized.  Pt had been in the hospital for 5 days from the NORO virus.  Now at at home getting HHPT.  Maurine Minister was the PT there that arrived while on phone with me.  He said that pt was getting vestibular rehab [facility] along with the PT that was ordered when she was discharged from the hospital.  1 wk x 8 wks or 2x wk for 4 wks. [employer] pending) that determination.  She wanted Megan NP to know and if she had anything to add then we would call her back otherwise no need to call.  Pt said that was ok.

## 2024-01-05 NOTE — Telephone Encounter (Signed)
 Noted.

## 2024-01-06 ENCOUNTER — Telehealth: Payer: Self-pay

## 2024-01-06 NOTE — Telephone Encounter (Signed)
 Task completed. Spoke with Marylene Land at [facility] at [phone]. Patient's list of diagnosis has been review as needed for home care.

## 2024-01-06 NOTE — Telephone Encounter (Signed)
 Copied from CRM [id number]. Topic: Clinical - Home Health Verbal Orders >> Jan 05, 2024  9:26 AM Jannette Spanner L wrote: Marylene Land with [facility] is requesting to verify an admitting diagnosis for the patient. The orginial diagnoses were resolved since being discharged from the hospital. Their diagnosis recommendations are hypertension heart disease with heart failure, history of heart failure, fibromyalgia, and rheumatoid arthritis. Their call back number is [phone].

## 2024-01-07 DIAGNOSIS — E119 Type 2 diabetes mellitus without complications: Secondary | ICD-10-CM | POA: Diagnosis not present

## 2024-01-07 DIAGNOSIS — I5032 Chronic diastolic (congestive) heart failure: Secondary | ICD-10-CM | POA: Diagnosis not present

## 2024-01-07 DIAGNOSIS — I110 Hypertensive heart disease with heart failure: Secondary | ICD-10-CM | POA: Diagnosis not present

## 2024-01-07 DIAGNOSIS — I2510 Atherosclerotic heart disease of native coronary artery without angina pectoris: Secondary | ICD-10-CM | POA: Diagnosis not present

## 2024-01-07 DIAGNOSIS — M797 Fibromyalgia: Secondary | ICD-10-CM | POA: Diagnosis not present

## 2024-01-07 DIAGNOSIS — I471 Supraventricular tachycardia, unspecified: Secondary | ICD-10-CM | POA: Diagnosis not present

## 2024-01-10 ENCOUNTER — Ambulatory Visit (HOSPITAL_COMMUNITY)
Admission: RE | Admit: 2024-01-10 | Discharge: 2024-01-10 | Disposition: A | Discharge status: Home / Self Care | Payer: Medicare Other | Source: Ambulatory Visit | Attending: Cardiology | Admitting: Cardiology

## 2024-01-10 ENCOUNTER — Encounter: Payer: Self-pay | Admitting: Cardiology

## 2024-01-10 DIAGNOSIS — I10 Essential (primary) hypertension: Secondary | ICD-10-CM | POA: Diagnosis not present

## 2024-01-10 DIAGNOSIS — I1A0 Resistant hypertension: Secondary | ICD-10-CM | POA: Insufficient documentation

## 2024-01-10 DIAGNOSIS — R0602 Shortness of breath: Secondary | ICD-10-CM | POA: Insufficient documentation

## 2024-01-10 NOTE — Progress Notes (Signed)
 Normal Renal artery duplex   No suggestion of blockage in the kidney arteries to explain elevated BP

## 2024-01-12 ENCOUNTER — Ambulatory Visit (HOSPITAL_COMMUNITY): Admit: 2024-01-12 | Payer: Medicare Other | Admitting: Orthopedic Surgery

## 2024-01-12 ENCOUNTER — Telehealth: Payer: Self-pay

## 2024-01-12 ENCOUNTER — Ambulatory Visit (INDEPENDENT_AMBULATORY_CARE_PROVIDER_SITE_OTHER): Admitting: Family Medicine

## 2024-01-12 ENCOUNTER — Encounter: Payer: Self-pay | Admitting: Family Medicine

## 2024-01-12 ENCOUNTER — Other Ambulatory Visit: Payer: Self-pay

## 2024-01-12 VITALS — BP 106/63 | HR 59 | Ht 66.0 in | Wt 155.0 lb

## 2024-01-12 DIAGNOSIS — K529 Noninfective gastroenteritis and colitis, unspecified: Secondary | ICD-10-CM

## 2024-01-12 DIAGNOSIS — I110 Hypertensive heart disease with heart failure: Secondary | ICD-10-CM | POA: Diagnosis not present

## 2024-01-12 DIAGNOSIS — E876 Hypokalemia: Secondary | ICD-10-CM

## 2024-01-12 DIAGNOSIS — I10 Essential (primary) hypertension: Secondary | ICD-10-CM | POA: Diagnosis not present

## 2024-01-12 DIAGNOSIS — E119 Type 2 diabetes mellitus without complications: Secondary | ICD-10-CM | POA: Diagnosis not present

## 2024-01-12 DIAGNOSIS — I5032 Chronic diastolic (congestive) heart failure: Secondary | ICD-10-CM | POA: Diagnosis not present

## 2024-01-12 DIAGNOSIS — M797 Fibromyalgia: Secondary | ICD-10-CM | POA: Diagnosis not present

## 2024-01-12 DIAGNOSIS — I2510 Atherosclerotic heart disease of native coronary artery without angina pectoris: Secondary | ICD-10-CM | POA: Diagnosis not present

## 2024-01-12 DIAGNOSIS — I471 Supraventricular tachycardia, unspecified: Secondary | ICD-10-CM | POA: Diagnosis not present

## 2024-01-12 SURGERY — TRANSFORAMINAL LUMBAR INTERBODY FUSION (TLIF) WITH PEDICLE SCREW FIXATION 1 LEVEL
Anesthesia: General | Laterality: Left

## 2024-01-12 NOTE — Patient Outreach (Signed)
 Care Management  Transitions of Care Program Transitions of Care Post-discharge week 3   01/12/2024 Name: Mia [name] MRN: [id number] DOB: 1951/06/07  Subjective: Mia [name] is a 73 y.o. year old female who is a primary care patient of Mia Games, MD. The Care Management team Engaged with patient Engaged with patient by telephone to assess and address transitions of care needs.   Consent to Services:  Patient was given information about care management services, agreed to services, and gave verbal consent to participate.   Assessment:     Patient doing well reports saw PCP this morning as planned and states labs drawn and urine recheck completed - Patient states PCP plans to follow up regarding CPAP. Patient reviewed medications with PCP today and did a review with TOC RN. Patient pleased to be feeling well enough to be at the dog park during call. Patient states cardiology appointment is actually 01/17/24 and she had Renal US 01/10/24 with good results. Patient agreeable to follow up call 01/18/24.       SDOH Interventions    Flowsheet Row Telephone from 12/28/2023 in [facility] Care Coordination from 12/27/2023 in [facility] Care Coordination Care Coordination from 11/16/2023 in [facility] Care Coordination Care Coordination from 10/25/2023 in [facility] Care Coordination Care Coordination from 09/14/2023 in [facility] Care Coordination Care Coordination from 07/12/2023 in [facility]  SDOH Interventions        Food Insecurity Interventions Intervention Not Indicated -- -- -- -- --  Housing Interventions Intervention Not Indicated -- -- -- -- --  Transportation Interventions Intervention Not Indicated -- -- -- -- --  Utilities Interventions Intervention Not Indicated -- -- -- -- --  Physical Activity Interventions --  Other (Comments)  [risk for falls. Has wheelchair , walker and cane to use as needed to help with ambulation] Other (Comments)  [at risk for falls. she has cane, wheelchair, and walker to use as needed to help her walk] Other (Comments)  [does have knee pain issues. Fall risk] Other (Comments)  [fall risk. has walking difficulty] Other (Comments)  [sometimes uses a walker to help her walk]  Stress Interventions -- Provide Counseling  [client has stress in managing medical needs. She is at risk for falls. Needs help with ADLs] Provide Counseling  [client has knee pain issues and back pain issues. She has MRI scheduled for tomorrow. she has stress in managing medical needs] Other (Comment)  [has stress in managing medical needs] Provide Counseling  [has stress in managing medical needs] Other (Comment)  [has stress occasionally in managing her medical needs]        Goals Addressed               This Visit's Progress     TOC Care Plan - Patient will report no readmissions in the next 30 days (pt-stated)        Current Barriers:  Patient was unaware of need to schedule PCP hospital follow up 01/04/24 Follow up appointment with PCP is scheduled 01/12/24 - Patient completed PCP appointment as scheduled - had blood work, repeat urine - MD checking on CPAP Patient unaware if she should postpone her planned 01/12/24 lumbar surgery - 01/04/24 surgery currently cancelled  RNCM Clinical Goal(s):  Patient will work with the Care Management team over the next 30 days to address Transition of Care Barriers:PCP hospital follow up and plan for  lumbar surgery 01/04/24 Follow up scheduled and surgery cancelled 01/12/24 not rescheduled Patient will take all medications exactly as prescribed and will call provider for medication related questions as evidenced by patient report and health record - 01/04/24 reconciled and patient reported new medication that was added to list - 01/12/24 patient reports she reviewed with PCP  at appointment today Patient will attend all scheduled medical appointments: cardiology appointment 12/31/23 as evidenced by patient scheduling PCP follow up appointment -01/12/24 Patient reports having PCP appointment today and cardiology appointment is 01/17/24 was not 3/7 - states she had rental [place] 3/17 and results were good  Interventions: Evaluation of current treatment plan related to  self management and patient's adherence to plan as established by provider 01/12/24 ONGOING  Transitions of Care:  goal on track. Doctor Visits  - discussed the importance of doctor visits - 01/04/24 Patient has 01/12/24 hospital follow up with PCP - Patient completed PCP appointment as planned - awaiting results of labs/urine - PCP is going to follow up re: CPAP  Patient Goals/Self-Care Activities: Ongoing TOC weekly calls for 30 day program  Participate in Transition of Care Program/Attend [facility] scheduled calls Notify RN Care Manager of TOC call rescheduling needs Take all medications as prescribed - 01/12/24 medications reviewed with PCP today and with [facility] RN Attend all scheduled provider appointments - patient to call and schedule PCP hospital follow up - Appointment completed 01/12/24 awaiting lab results urine results Patient will call ortho surgeon to see if MD recommends patient postpone 01/12/24 lumbar surgery - 01/12/24 surgery is canceled at this time and has not been rescheduled Patient will continue using rollator as directed  Follow Up Plan:  Telephone follow up appointment with care management team member scheduled for:  01/18/24 10am  The patient has been provided with contact information for the care management team and has been advised to call with any health related questions or concerns.          Plan: Telephone follow up appointment with care management team member scheduled for: 01/18/24 10am post cardiology appointment The patient has been provided with contact information for the care management  team and has been advised to call with any health related questions or concerns.   Hilbert Odor RN, CCM [place]  VBCI-Population Health RN Care Manager [phone]

## 2024-01-12 NOTE — Progress Notes (Signed)
 Established Patient Office Visit  Subjective  Patient ID: Mia [name], female    DOB: April 18, 1951  Age: 73 y.o. MRN: [id number]  Chief Complaint  Patient presents with   Hospitalization Follow-up    HPI  Pt stated that her potassium was low in the hospital.    She is doing PT 2x weekly for balance at home.   Currently experiencing L sided throat pain x3 days.   Pt hasn't heard about CPAP will check on this for her.    She is here today for hospital follow-up.  She was admitted to [facility] on February 25 and discharged home 5 days later on March 3 diagnosed with acute colitis and acute cystitis.  She initially presented with recurrent nausea vomiting and diarrhea.  She ended up testing positive for norovirus.  They treated her for UTI and gave her IV fluids.  She says her potassium was low while she was there.  Since being home she is feeling a lot better.  Appetite slowly coming back but still not normal yesterday she was able to eat a bagel.  She still having a little bit of diarrhea in the mornings but then it seems to be better during the day.  No cramping.  She just feels tired and exhausted.  Also noted that her blood pressure was running really high when she was in the hospital.  Still has not heard back about getting the updated CPAP.  Range and that she had had an initial sleep study that was positive for sleep apnea then a repeat sleep study in lab that came back negative and then had a repeat home study in December that came back showing severe sleep apnea.    ROS    Objective:     BP 106/63   Pulse (!) 59   Ht 5\' 6"  (1.676 m)   Wt 155 lb (70.3 kg)   SpO2 95%   BMI 25.02 kg/m    Physical Exam Vitals and nursing note reviewed.  Constitutional:      Appearance: Normal appearance.  HENT:     Head: Normocephalic and atraumatic.     Left Ear: Tympanic membrane, ear canal and external ear normal.     Mouth/Throat:     Pharynx: Oropharynx is clear. No  oropharyngeal exudate or posterior oropharyngeal erythema.  Eyes:     Conjunctiva/sclera: Conjunctivae normal.  Cardiovascular:     Rate and Rhythm: Normal rate and regular rhythm.  Pulmonary:     Effort: Pulmonary effort is normal.     Breath sounds: Normal breath sounds.  Musculoskeletal:     Cervical back: Neck supple.  Lymphadenopathy:     Cervical: No cervical adenopathy.  Skin:    General: Skin is warm and dry.  Neurological:     Mental Status: She is alert.  Psychiatric:        Mood and Affect: Mood normal.      No results found for any visits on 01/12/24.    The 10-year ASCVD risk score (Arnett DK, et al., 2019) is: 19.8%    Assessment & Plan:   Problem List Items Addressed This Visit       Cardiovascular and Mediastinum   Hypertension - Primary   Blood pressure looks good.  She has not had any really high blood pressure since being home it does tend to fluctuate though.      Relevant Orders   Basic Metabolic Panel (BMET)   CBC with Differential/Platelet  Urinalysis, Routine w reflex microscopic   Other Visit Diagnoses       Colitis       Relevant Orders   Basic Metabolic Panel (BMET)   CBC with Differential/Platelet   Urinalysis, Routine w reflex microscopic     Hypokalemia       Relevant Orders   Basic Metabolic Panel (BMET)   CBC with Differential/Platelet   Urinalysis, Routine w reflex microscopic      Colitis and cystitis she did complete her antibiotics and is feeling better still having a little bit of loose stools first thing in the mornings I would like to recheck potassium today just to make sure that it still adequate.  With the fatigue we will also just check another CBC just to make sure everything is normalized.  Hopefully she will just continue to regain her strength.  No follow-ups on file.    Nani Gasser, MD

## 2024-01-12 NOTE — Progress Notes (Signed)
 Pt stated that her potassium was low in the hospital.   She is doing PT 2x weekly for balance at home.  Currently experiencing L sided throat pain x3 days.  Pt hasn't heard about CPAP will check on this for her.

## 2024-01-12 NOTE — Assessment & Plan Note (Signed)
 Blood pressure looks good.  She has not had any really high blood pressure since being home it does tend to fluctuate though.

## 2024-01-13 ENCOUNTER — Other Ambulatory Visit: Payer: Self-pay | Admitting: Cardiovascular Disease

## 2024-01-13 ENCOUNTER — Encounter: Payer: Self-pay | Admitting: Family Medicine

## 2024-01-13 LAB — CBC WITH DIFFERENTIAL/PLATELET
Basophils Absolute: 0.1 10*3/uL (ref 0.0–0.2)
Basos: 1 %
EOS (ABSOLUTE): 0.2 10*3/uL (ref 0.0–0.4)
Eos: 4 %
Hematocrit: 39.2 % (ref 34.0–46.6)
Hemoglobin: 12.9 g/dL (ref 11.1–15.9)
Immature Grans (Abs): 0 10*3/uL (ref 0.0–0.1)
Immature Granulocytes: 1 %
Lymphocytes Absolute: 1.2 10*3/uL (ref 0.7–3.1)
Lymphs: 20 %
MCH: 30.2 pg (ref 26.6–33.0)
MCHC: 32.9 g/dL (ref 31.5–35.7)
MCV: 92 fL (ref 79–97)
Monocytes Absolute: 0.7 10*3/uL (ref 0.1–0.9)
Monocytes: 11 %
Neutrophils Absolute: 3.9 10*3/uL (ref 1.4–7.0)
Neutrophils: 63 %
Platelets: 284 10*3/uL (ref 150–450)
RBC: 4.27 x10E6/uL (ref 3.77–5.28)
RDW: 17.5 % — ABNORMAL HIGH (ref 11.7–15.4)
WBC: 6.1 10*3/uL (ref 3.4–10.8)

## 2024-01-13 LAB — BASIC METABOLIC PANEL
BUN/Creatinine Ratio: 10 — ABNORMAL LOW (ref 12–28)
BUN: 8 mg/dL (ref 8–27)
CO2: 24 mmol/L (ref 20–29)
Calcium: 9.8 mg/dL (ref 8.7–10.3)
Chloride: 97 mmol/L (ref 96–106)
Creatinine, Ser: 0.83 mg/dL (ref 0.57–1.00)
Glucose: 96 mg/dL (ref 70–99)
Potassium: 4.7 mmol/L (ref 3.5–5.2)
Sodium: 135 mmol/L (ref 134–144)
eGFR: 75 mL/min/{1.73_m2} (ref >59)

## 2024-01-13 LAB — MICROSCOPIC EXAMINATION
Epithelial Cells (non renal): >10 /HPF — AB (ref 0–10)
RBC, Urine: NONE SEEN /HPF (ref 0–2)

## 2024-01-13 LAB — URINALYSIS, ROUTINE W REFLEX MICROSCOPIC
Glucose, UA: NEGATIVE
Nitrite, UA: NEGATIVE
RBC, UA: NEGATIVE
Specific Gravity, UA: 1.028 (ref 1.005–1.030)
Urobilinogen, Ur: 1 mg/dL (ref 0.2–1.0)
pH, UA: 5 (ref 5.0–7.5)

## 2024-01-13 NOTE — Progress Notes (Signed)
 Hi Mia [name], your metabolic panel is OK. Electrolytes look good. Your blood count is normal. No anemia. There is protein in your urine that I want to recheck in 2 weeks with a urine microalbumin/.

## 2024-01-14 DIAGNOSIS — I5032 Chronic diastolic (congestive) heart failure: Secondary | ICD-10-CM | POA: Diagnosis not present

## 2024-01-14 DIAGNOSIS — I2510 Atherosclerotic heart disease of native coronary artery without angina pectoris: Secondary | ICD-10-CM | POA: Diagnosis not present

## 2024-01-14 DIAGNOSIS — I471 Supraventricular tachycardia, unspecified: Secondary | ICD-10-CM | POA: Diagnosis not present

## 2024-01-14 DIAGNOSIS — M797 Fibromyalgia: Secondary | ICD-10-CM | POA: Diagnosis not present

## 2024-01-14 DIAGNOSIS — I110 Hypertensive heart disease with heart failure: Secondary | ICD-10-CM | POA: Diagnosis not present

## 2024-01-14 DIAGNOSIS — E119 Type 2 diabetes mellitus without complications: Secondary | ICD-10-CM | POA: Diagnosis not present

## 2024-01-14 NOTE — Telephone Encounter (Signed)
 Andy from [facility] came into the office requesting F2F notes prior to sleep study, Signed CPAP orders and split night sleep study.  [name] Matte states Medicare requires at least 4% AHI in order to qualify patient. Requested information was printed and given to [place]. [name] Matte states that the office will try again to get approval. If not approved, another sleep study may be required or patient may have to pay out of pocket for a new CPAP machine.

## 2024-01-16 NOTE — Progress Notes (Unsigned)
 Cardiology Office Note:  .   Date:  01/17/2024  ID:  Junius Roads Kipper, DOB 1950-12-03, MRN [id number] PCP: Agapito Games, MD  [facility] Providers Cardiologist:  Kristeen Miss, MD {  History of Present Illness: .   Anavey Coombes Constancio is a 73 y.o. female with a past medical history of SVT, Raynaud's phenomena, diabetes mellitus, minimal CAD by cath 2006, and sleep apnea here for follow-up appointment.  She has been followed by Dr. Elease Hashimoto since 2016.  She had a normal Myoview study at that time.  She had a heart rate of 187 November 2017.  Was having some dizziness and multiple episodes of elevated heart rate.  Found to have SVT.  Having more SVT November 2018 and metoprolol was increased.  She was up to 100 mg twice daily at that point.  2019 her palpitations essentially resolved and were only occasional.  Then, May 2019 increase metoprolol up to 250 mg twice a day due to increase in SVT episodes.  Her diet was terrible and eating lots of snacks instead of real meals.  Unfortunately, test positive for COVID November 07, 2019.  She was in [facility] for 6 days.  Blood pressure was elevated.  Started on amlodipine.  Double her losartan.  June 2021 she was having some lower extremity edema so her primary gave her some IV Lasix and started her on HCTZ.  She felt better after diuresing 20 to 25 pounds.  She was then seen January 2022 and she was evaluated by Dr. Ladona Ridgel for possible ablation.  He wanted to get her blood pressure down before pursuing.  She has lost some weight and was avoiding salt.  Occasional episodes of SVT.  January 2023 she was having some DOE and it was thought to be generalized deconditioning.  Had shoulder surgery.  Some palpitations which were "hard heartbeats".  No real exercise of back issues.  Occasionally when walking she would experience a woozy like feeling.  Had an event monitor with baseline tachycardia.  Seeing Dr. Ladona Ridgel for EP.  High doses of metoprolol  which helps control her SVT fairly well.  May need SVT ablation.  Seen November 12, 2022.  This was her last visit.  Saw Dr. Ladona Ridgel discussed ablation.  Dr. Ladona Ridgel change losartan to valsartan for elevated BP and BP is still been an issue.  Hospitalized for severe HTN when she was down in [place].  Started on hydralazine primary care changed to amlodipine.  BP tends to gradually rise throughout the day.  Drinking a lot of soda about 2 L a day and eating salty snacks throughout her day.  She was last seen by me 08/2023, she has a history of hypertension, atrial fibrillation, rheumatoid arthritis, and interstitial cystitis, presents for a routine follow-up. The patient reports a good response to the current medication regimen, with blood pressure well-controlled in the mornings. However, the patient notes occasional spikes in blood pressure in the evenings, which have been managed by adjusting the timing of medication intake.  The patient also reports experiencing dizziness and balance issues, which have led to multiple falls. These symptoms are not constant but occur sporadically, sometimes affecting the patient's ability to read or focus. The patient has undergone vestibular testing, which revealed a problem in central processing. She has follow-up with neurology.  In addition to these symptoms, the patient experiences shortness of breath, particularly after physical exertion such as showering. The patient is also scheduled for a sleep apnea test  due to a history of the condition.  Reports no chest pain, pressure, or tightness. No edema, orthopnea, PND. Reports no palpitations.   Today, she presents with a history of hypertension, viral infection, low potassium levels, and bladder infection, for a follow-up visit. She was recently hospitalized due to a severe viral infection, during which her potassium levels dropped significantly and she developed a bladder infection. She reports that her blood pressure  had been fluctuating significantly during her hospital stay, reaching as high as 197, and then dropping to low levels. She was prescribed Maxzide by her primary care provider's PA, but has since stopped taking it as her blood pressure has stabilized.  The patient also reports having balance issues, for which she is currently undergoing vestibular rehab. She notes that the rehab is exhausting but is helping with her balance. She also mentions that she has started taking Pristiq and is due for a cholesterol test at the end of the month.  Reports no shortness of breath nor dyspnea on exertion. Reports no chest pain, pressure, or tightness. No edema, orthopnea, PND. Reports no palpitations.   Discussed the use of AI scribe software for clinical note transcription with the patient, who gave verbal consent to proceed.  ROS: Pertinent ROS in HPI  Studies R eviewed: Marland Kitchen       CTA to rule out PE 05/31/2023 FINDINGS: Cardiovascular: Heart is normal size. Aorta is normal caliber. No filling defects in the pulmonary arteries to suggest pulmonary emboli. Scattered aortic calcifications.   Mediastinum/Nodes: No mediastinal, hilar, or axillary adenopathy. Trachea and esophagus are unremarkable. Thyroid unremarkable.   Lungs/Pleura: No confluent airspace opacities, effusions or pulmonary nodules.   Upper Abdomen: No acute findings.  Pneumobilia.   Musculoskeletal: Chest wall soft tissues are unremarkable. No acute bony abnormality.   Review of the MIP images confirms the above findings.   IMPRESSION: No evidence of pulmonary embolus.   No acute cardiopulmonary disease.   Aortic Atherosclerosis (ICD10-I70.0).       Physical Exam:   VS:  BP 120/78   Pulse (!) 56   Ht 5\' 6"  (1.676 m)   Wt 162 lb 9.6 oz (73.8 kg)   SpO2 96%   BMI 26.24 kg/m    GEN: Well nourished, well developed in no acute distress NECK: No JVD; No carotid bruits CARDIAC: RRR, no murmurs, rubs, gallops RESPIRATORY:  Clear  to auscultation without rales, wheezing or rhonchi  ABDOMEN: Soft, non-tender, non-distended EXTREMITIES:  No edema; No deformity   ASSESSMENT AND PLAN: .    Hypertension Blood pressure stabilized post-hospitalization with medication adjustment. Discontinued Maxzide. Variability likely due to recent infection and hospitalization. - Monitor blood pressure regularly. - Reassess antihypertensive medication if instability occurs. -for now continue norvasc, diovan, and metoprolol   Hyperlipidemia LDL cholesterol at 116 mg/dL, above target due to family history of coronary artery disease. HDL cholesterol at 56 mg/dL is satisfactory. - Order lipid panel and liver function tests. - Review results and adjust treatment as necessary.  Vestibular dysfunction Undergoing vestibular rehabilitation for balance issues. Rehabilitation is exhausting but necessary. - Continue vestibular rehabilitation therapy.  Chronic Thrombosis Noted tenderness and occasional swelling in the lower extremities. Currently on Eliquis 5mg  BID. -Continue Eliquis 5mg  BID. -Encourage elevation of feet, especially at night. -Follow up with primary care provider in 3 months.      Dispo: She can follow-up in 6 months with Dr. Anne Fu  Signed, Sharlene Dory, PA-C

## 2024-01-17 ENCOUNTER — Ambulatory Visit: Attending: Physician Assistant | Admitting: Physician Assistant

## 2024-01-17 ENCOUNTER — Encounter: Payer: Self-pay | Admitting: Physician Assistant

## 2024-01-17 VITALS — BP 120/78 | HR 56 | Ht 66.0 in | Wt 162.6 lb

## 2024-01-17 DIAGNOSIS — Z79899 Other long term (current) drug therapy: Secondary | ICD-10-CM | POA: Diagnosis not present

## 2024-01-17 DIAGNOSIS — I1A0 Resistant hypertension: Secondary | ICD-10-CM | POA: Insufficient documentation

## 2024-01-17 DIAGNOSIS — R0602 Shortness of breath: Secondary | ICD-10-CM | POA: Diagnosis not present

## 2024-01-17 DIAGNOSIS — E785 Hyperlipidemia, unspecified: Secondary | ICD-10-CM | POA: Diagnosis not present

## 2024-01-17 LAB — HEPATIC FUNCTION PANEL
ALT: 66 IU/L — ABNORMAL HIGH (ref 0–32)
AST: 68 IU/L — ABNORMAL HIGH (ref 0–40)
Albumin: 4.3 g/dL (ref 3.8–4.8)
Alkaline Phosphatase: 215 IU/L — ABNORMAL HIGH (ref 44–121)
Bilirubin Total: 0.5 mg/dL (ref 0.0–1.2)
Bilirubin, Direct: 0.2 mg/dL (ref 0.00–0.40)
Total Protein: 7 g/dL (ref 6.0–8.5)

## 2024-01-17 LAB — LIPID PANEL
Chol/HDL Ratio: 2.3 ratio (ref 0.0–4.4)
Cholesterol, Total: 153 mg/dL (ref 100–199)
HDL: 66 mg/dL (ref >39)
LDL Chol Calc (NIH): 69 mg/dL (ref 0–99)
Triglycerides: 100 mg/dL (ref 0–149)
VLDL Cholesterol Cal: 18 mg/dL (ref 5–40)

## 2024-01-17 MED ORDER — METOPROLOL TARTRATE 100 MG PO TABS: 100.0000 mg | ORAL_TABLET | Freq: Two times a day (BID) | ORAL | 3 refills | Status: AC

## 2024-01-17 MED ORDER — ROSUVASTATIN CALCIUM 5 MG PO TABS: 5.0000 mg | ORAL_TABLET | Freq: Every day | ORAL | 3 refills | Status: AC

## 2024-01-17 MED ORDER — AMLODIPINE BESYLATE 5 MG PO TABS: 5.0000 mg | ORAL_TABLET | Freq: Every day | ORAL | 3 refills | Status: DC

## 2024-01-17 MED ORDER — VALSARTAN 160 MG PO TABS: 160.0000 mg | ORAL_TABLET | Freq: Two times a day (BID) | ORAL | 3 refills | Status: AC

## 2024-01-17 NOTE — Patient Instructions (Signed)
 Medication Instructions:  Your physician recommends that you continue on your current medications as directed. Please refer to the Current Medication list given to you today.  *If you need a refill on your cardiac medications before your next appointment, please call your pharmacy*   Lab Work: Fasting Lipid panel, LFT today If you have labs (blood work) drawn today and your tests are completely normal, you will receive your results only by: MyChart Message (if you have MyChart) OR A paper copy in the mail If you have any lab test that is abnormal or we need to change your treatment, we will call you to review the results.   Testing/Procedures: NONE   Follow-Up: At [facility], you and your health needs are our priority.  As part of our continuing mission to provide you with exceptional heart care, we have created designated Provider Care Teams.  These Care Teams include your primary Cardiologist (physician) and Advanced Practice Providers (APPs -  Physician Assistants and Nurse Practitioners) who all work together to provide you with the care you need, when you need it.  We recommend signing up for the patient portal called "MyChart".  Sign up information is provided on this After Visit Summary.  MyChart is used to connect with patients for Virtual Visits (Telemedicine).  Patients are able to view lab/test results, encounter notes, upcoming appointments, etc.  Non-urgent messages can be sent to your provider as well.   To learn more about what you can do with MyChart, go to [email or web address].    Your next appointment:   6 month(s)  Provider:   Anne Fu, MD  Other Instructions   1st Floor: - Lobby - Registration  - Pharmacy  - Lab - Cafe  2nd Floor: - PV Lab - Diagnostic Testing (echo, CT, nuclear med)  3rd Floor: - Vacant  4th Floor: - TCTS (cardiothoracic surgery) - AFib Clinic - Structural Heart Clinic - Vascular Surgery  - Vascular  Ultrasound  5th Floor: - HeartCare Cardiology (general and EP) - Clinical Pharmacy for coumadin, hypertension, lipid, weight-loss medications, and med management appointments    Valet parking services will be available as well.

## 2024-01-18 ENCOUNTER — Encounter: Payer: Self-pay | Admitting: Physician Assistant

## 2024-01-18 ENCOUNTER — Other Ambulatory Visit: Payer: Self-pay

## 2024-01-18 ENCOUNTER — Telehealth: Payer: Self-pay

## 2024-01-18 NOTE — Patient Outreach (Signed)
 Care Management  Transitions of Care Program Transitions of Care Post-discharge week 4   01/18/2024 Name: Mia [name] MRN: [id number] DOB: 14-Mar-1951  Subjective: Mia [name] is a 73 y.o. year old female who is a primary care patient of Mia Games, MD. The Care Management team Engaged with patient Engaged with patient by telephone to assess and address transitions of care needs.   Consent to Services:  Patient was given information about care management services, agreed to services, and gave verbal consent to participate.   Assessment:     Patient was resting at time of call. Patient confirms she saw cardiology yesterday, 01/17/24 as planned and states blood work was done - [facility] RN and patient reviewed hepatic results which were elevated and higher than last checked in January. Patient states she will wait to see if she hears from MD office today regarding results and if she does not, she will call tomorrow to discuss. Patient also confirms she will be having urine rechecked as directed by PCP. Patient states vestibular rehab causes her to be tired but she understands the importance. Patient states she plans to call DME company today regarding CPAP and will conference PCP office in on the call if needed. Patient agrees to contact [facility] RN with any questions/concerns prior to next scheduled call 01/27/24      SDOH Interventions    Flowsheet Row Telephone from 12/28/2023 in [place] [facility] DEPART[facility] Care Coordination from 12/27/2023 in [facility] Community Care Coordination Care Coordination from 11/16/2023 in [facility] Care Coordination Care Coordination from 10/25/2023 in [facility] Care Coordination Care Coordination from 09/14/2023 in [facility] [employer] [facility] Care Coordination Care Coordination from 07/12/2023 in [facility] [employer] [facility] Care Coordination  SDOH Interventions        Food  Insecurity Interventions Intervention Not Indicated -- -- -- -- --  Housing Interventions Intervention Not Indicated -- -- -- -- --  Transportation Interventions Intervention Not Indicated -- -- -- -- --  Utilities Interventions Intervention Not Indicated -- -- -- -- --  Physical Activity Interventions -- Other (Comments)  [risk for falls. Has wheelchair , walker and cane to use as needed to help with ambulation] Other (Comments)  [at risk for falls. she has cane, wheelchair, and walker to use as needed to help her walk] Other (Comments)  [does have knee pain issues. Fall risk] Other (Comments)  [fall risk. has walking difficulty] Other (Comments)  [sometimes uses a walker to help her walk]  Stress Interventions -- Provide Counseling  [client has stress in managing medical needs. She is at risk for falls. Needs help with ADLs] Provide Counseling  [client has knee pain issues and back pain issues. She has MRI scheduled for tomorrow. she has stress in managing medical needs] Other (Comment)  [has stress in managing medical needs] Provide Counseling  [has stress in managing medical needs] Other (Comment)  [has stress occasionally in managing her medical needs]        Goals Addressed               This Visit's Progress     TOC Care Plan - Patient will report no readmissions in the next 30 days (pt-stated)        Current Barriers:  Patient was unaware of need to schedule PCP hospital follow up 01/18/24 Patient completed Cardiology appointment as scheduled 01/17/24 - had blood work, ALT, AST, Alkaline elevated - educated patient if they don't  call her then she should call them to discuss - All levels increased further from 11/19/23 where they were already higher than normal - Patient will also have repeat urine - Patient previously said MD checking on CPAP and 3/25 patient states she is calling the company again today because they said they are waiting on something from MD   [facility] Clinical Goal(s):   Patient will work with the Care Management team over the next 30 days to address Transition of Care Barriers:PCP hospital follow up and plan for lumbar surgery - Ongoing 01/18/24 Patient will take all medications exactly as prescribed and will call provider for medication related questions as evidenced by patient report and health record - 01/18/24 reconciled with patient Patient will attend all scheduled medical appointments: 01/18/24 Patient reports cardiology appointment is 01/17/24 and VSS but hepatic function was elevated - patient will contact MD if she does not hear from them   Interventions: Evaluation of current treatment plan related to  self management and patient's adherence to plan as established by provider 01/12/24 ONGOING  Transitions of Care:  goal on track. Doctor Visits  - discussed the importance of doctor visits - 01/04/24 Patient has 01/18/24 awaiting results of labs/urine - PCP is going to follow up re: CPAP (patient is calling them again today about CPAP and states she will conference in MD if they tell her they still need something from MD  Patient Goals/Self-Care Activities: Ongoing TOC weekly calls for 30 day program  Participate in Transition of Care Program/Attend [facility] scheduled calls Notify RN Care Manager of TOC call rescheduling needs Take all medications as prescribed - 01/18/24 medications reviewed with PCP today and with [facility] RN Attend all scheduled provider appointments - patient to call and schedule PCP hospital follow up - Patient will have urine rechecked as instructed  Patient will continue using rollator as directed  Follow Up Plan:  Telephone follow up appointment with care management team member scheduled for:  01/27/24 11am  The patient has been provided with contact information for the care management team and has been advised to call with any health related questions or concerns.          Plan: Telephone follow up appointment with care management team  member scheduled for: 01/27/24 11am The patient has been provided with contact information for the care management team and has been advised to call with any health related questions or concerns.   Hilbert Odor RN, CCM [place]  VBCI-Population Health RN Care Manager [phone]

## 2024-01-18 NOTE — Patient Instructions (Signed)
 Visit Information  Thank you for taking time to visit with me today. Please don't hesitate to contact me if I can be of assistance to you before our next scheduled telephone appointment.  Our next appointment is by telephone on 01/27/24 at 11am  Following is a copy of your care plan:   Goals Addressed               This Visit's Progress     TOC Care Plan - Patient will report no readmissions in the next 30 days (pt-stated)        Current Barriers:  Patient was unaware of need to schedule PCP hospital follow up 01/18/24 Patient completed Cardiology appointment as scheduled 01/17/24 - had blood work, ALT, AST, Alkaline elevated - educated patient if they don't call her then she should call them to discuss - All levels increased further from 11/19/23 where they were already higher than normal - Patient will also have repeat urine - Patient previously said MD checking on CPAP and 3/25 patient states she is calling the company again today because they said they are waiting on something from MD   [facility] Clinical Goal(s):  Patient will work with the Care Management team over the next 30 days to address Transition of Care Barriers:PCP hospital follow up and plan for lumbar surgery - Ongoing 01/18/24 Patient will take all medications exactly as prescribed and will call provider for medication related questions as evidenced by patient report and health record - 01/18/24 reconciled with patient Patient will attend all scheduled medical appointments: 01/18/24 Patient reports cardiology appointment is 01/17/24 and VSS but hepatic function was elevated - patient will contact MD if she does not hear from them   Interventions: Evaluation of current treatment plan related to  self management and patient's adherence to plan as established by provider 01/12/24 ONGOING  Transitions of Care:  goal on track. Doctor Visits  - discussed the importance of doctor visits - 01/04/24 Patient has 01/18/24 awaiting results of  labs/urine - PCP is going to follow up re: CPAP (patient is calling them again today about CPAP and states she will conference in MD if they tell her they still need something from MD  Patient Goals/Self-Care Activities: Ongoing TOC weekly calls for 30 day program  Participate in Transition of Care Program/Attend [facility] scheduled calls Notify RN Care Manager of TOC call rescheduling needs Take all medications as prescribed - 01/18/24 medications reviewed with PCP today and with [facility] RN Attend all scheduled provider appointments - patient to call and schedule PCP hospital follow up - Patient will have urine rechecked as instructed  Patient will continue using rollator as directed  Follow Up Plan:  Telephone follow up appointment with care management team member scheduled for:  01/27/24 11am  The patient has been provided with contact information for the care management team and has been advised to call with any health related questions or concerns.          Patient verbalizes understanding of instructions and care plan provided today and agrees to view in MyChart. Active MyChart status and patient understanding of how to access instructions and care plan via MyChart confirmed with patient.     Telephone follow up appointment with care management team member scheduled for:01/27/24 The patient has been provided with contact information for the care management team and has been advised to call with any health related questions or concerns.   Please call the care guide team at [phone] if you  need to cancel or reschedule your appointment.   Please call the Suicide and Crisis Lifeline: 988 call the [place] National Suicide Prevention Lifeline: 614-040-5668 or TTY: 331-012-5761 TTY 210-731-7019) to talk to a trained counselor call 1-800-273-TALK (toll free, 24 hour hotline) call 911 if you are experiencing a Mental Health or Behavioral Health Crisis or need someone to talk to.  Hilbert Odor RN,  CCM [place]  VBCI-Population Health RN Care Manager [phone]

## 2024-01-19 DIAGNOSIS — I471 Supraventricular tachycardia, unspecified: Secondary | ICD-10-CM | POA: Diagnosis not present

## 2024-01-19 DIAGNOSIS — M797 Fibromyalgia: Secondary | ICD-10-CM | POA: Diagnosis not present

## 2024-01-19 DIAGNOSIS — I110 Hypertensive heart disease with heart failure: Secondary | ICD-10-CM | POA: Diagnosis not present

## 2024-01-19 DIAGNOSIS — I5032 Chronic diastolic (congestive) heart failure: Secondary | ICD-10-CM | POA: Diagnosis not present

## 2024-01-19 DIAGNOSIS — I2510 Atherosclerotic heart disease of native coronary artery without angina pectoris: Secondary | ICD-10-CM | POA: Diagnosis not present

## 2024-01-19 DIAGNOSIS — E119 Type 2 diabetes mellitus without complications: Secondary | ICD-10-CM | POA: Diagnosis not present

## 2024-01-20 ENCOUNTER — Encounter: Payer: Self-pay | Admitting: Family Medicine

## 2024-01-21 ENCOUNTER — Ambulatory Visit: Admitting: Family Medicine

## 2024-01-21 ENCOUNTER — Encounter: Payer: Self-pay | Admitting: Family Medicine

## 2024-01-21 VITALS — BP 128/82 | HR 59 | Ht 66.0 in | Wt 162.0 lb

## 2024-01-21 DIAGNOSIS — I5032 Chronic diastolic (congestive) heart failure: Secondary | ICD-10-CM | POA: Diagnosis not present

## 2024-01-21 DIAGNOSIS — E119 Type 2 diabetes mellitus without complications: Secondary | ICD-10-CM | POA: Diagnosis not present

## 2024-01-21 DIAGNOSIS — I2510 Atherosclerotic heart disease of native coronary artery without angina pectoris: Secondary | ICD-10-CM | POA: Diagnosis not present

## 2024-01-21 DIAGNOSIS — E538 Deficiency of other specified B group vitamins: Secondary | ICD-10-CM | POA: Diagnosis not present

## 2024-01-21 DIAGNOSIS — I110 Hypertensive heart disease with heart failure: Secondary | ICD-10-CM | POA: Diagnosis not present

## 2024-01-21 DIAGNOSIS — N3000 Acute cystitis without hematuria: Secondary | ICD-10-CM | POA: Diagnosis not present

## 2024-01-21 DIAGNOSIS — M797 Fibromyalgia: Secondary | ICD-10-CM | POA: Diagnosis not present

## 2024-01-21 DIAGNOSIS — I471 Supraventricular tachycardia, unspecified: Secondary | ICD-10-CM | POA: Diagnosis not present

## 2024-01-21 LAB — POCT URINALYSIS DIP (CLINITEK)
Blood, UA: NEGATIVE
Glucose, UA: NEGATIVE mg/dL
Ketones, POC UA: NEGATIVE mg/dL
Nitrite, UA: NEGATIVE
POC PROTEIN,UA: 100 — AB
Spec Grav, UA: >=1.03 — AB (ref 1.010–1.025)
Urobilinogen, UA: 0.2 U/dL
pH, UA: 6 (ref 5.0–8.0)

## 2024-01-21 MED ORDER — CYANOCOBALAMIN 1000 MCG/ML IJ SOLN
1000.0000 ug | Freq: Once | INTRAMUSCULAR | Status: AC
Start: 2024-01-21 — End: 2024-01-21
  Administered 2024-01-21: 1000 ug via INTRAMUSCULAR

## 2024-01-21 NOTE — Progress Notes (Signed)
 Pt here today to get B12 injection. And UA and culture. Pt denies CP,SOB, medication changes.                         Pt given B12 in RUOQ per the pt request. Tolerated well no redness or swelling noted at the site. UA and culture also done on pt. Pt advised to RTC in 30 days around 02/20/24

## 2024-01-25 ENCOUNTER — Encounter: Payer: Self-pay | Admitting: Family Medicine

## 2024-01-26 LAB — URINE CULTURE

## 2024-01-27 ENCOUNTER — Telehealth: Payer: Self-pay

## 2024-01-27 ENCOUNTER — Other Ambulatory Visit: Payer: Self-pay

## 2024-01-27 ENCOUNTER — Encounter: Payer: Self-pay | Admitting: Family Medicine

## 2024-01-27 MED ORDER — SULFAMETHOXAZOLE-TRIMETHOPRIM 800-160 MG PO TABS: 1.0000 | ORAL_TABLET | Freq: Two times a day (BID) | ORAL | 0 refills | Status: DC

## 2024-01-27 NOTE — Progress Notes (Signed)
 Hi Sevana, your urine culture came back positive for a bacteria called Klebsiella.  I am going to send over an antibiotic to get this cleared up if you continue to have symptoms please let us know and we can always repeat the culture if needed.  Make sure you are drinking plenty of water

## 2024-01-27 NOTE — Patient Outreach (Signed)
 Care Management  Transitions of Care Program Transitions of Care Post-discharge 5 TOC 30 day program closure note   01/27/2024 Name: Mia [name] MRN: [id number] DOB: May 23, 1951  Subjective: Mia [name] is a 73 y.o. year old female who is a primary care patient of Agapito Games, MD. The Care Management team Engaged with patient Engaged with patient by telephone to assess and address transitions of care needs.   Consent to Services:  Patient was given information about care management services, agreed to services, and gave verbal consent to participate.   Assessment:     Patient reports continued discomfort with bladder and asked about results of recent urine culture. TOC RN sent message to PCP, Dr Linford Arnold who confirmed culture was positive and she is sending prescription for antibiotic. TOC RN made patient aware. Patient verbalized appreciation. TOC RN discussed with patient the 30 day program and patient feels she is good to be closed out of program and follow up with her PCP. Patient states she understands to call PCP with any new issues, questions, or concerns.       SDOH Interventions    Flowsheet Row Telephone from 12/28/2023 in [place] POPULATION HEALTH DEPARTMENT Care Coordination from 12/27/2023 in [facility] Care Coordination Care Coordination from 11/16/2023 in [facility] Care Coordination Care Coordination from 10/25/2023 in [facility] Care Coordination Care Coordination from 09/14/2023 in [facility] Care Coordination Care Coordination from 07/12/2023 in [facility] [employer] Care Coordination  SDOH Interventions        Food Insecurity Interventions Intervention Not Indicated -- -- -- -- --  Housing Interventions Intervention Not Indicated -- -- -- -- --  Transportation Interventions Intervention Not Indicated -- -- -- -- --  Utilities Interventions Intervention Not  Indicated -- -- -- -- --  Physical Activity Interventions -- Other (Comments)  [risk for falls. Has wheelchair , walker and cane to use as needed to help with ambulation] Other (Comments)  [at risk for falls. she has cane, wheelchair, and walker to use as needed to help her walk] Other (Comments)  [does have knee pain issues. Fall risk] Other (Comments)  [fall risk. has walking difficulty] Other (Comments)  [sometimes uses a walker to help her walk]  Stress Interventions -- Provide Counseling  [client has stress in managing medical needs. She is at risk for falls. Needs help with ADLs] Provide Counseling  [client has knee pain issues and back pain issues. She has MRI scheduled for tomorrow. she has stress in managing medical needs] Other (Comment)  [has stress in managing medical needs] Provide Counseling  [has stress in managing medical needs] Other (Comment)  [has stress occasionally in managing her medical needs]        Goals Addressed               This Visit's Progress     COMPLETED: TOC Care Plan - Patient will report no readmissions in the next 30 days (pt-stated)        Current Barriers:  Patient was unaware of need to schedule PCP hospital follow up 01/18/24 Patient completed Cardiology appointment as scheduled 01/17/24 - had blood work, ALT, AST, Alkaline elevated - educated patient if they don't call her then she should call them to discuss - All levels increased further from 11/19/23 where they were already higher than normal - Patient will also have repeat urine - Patient previously said MD checking on CPAP and 3/25 patient  states she is calling the company again today because they said they are waiting on something from MD - (01/27/24 patient reporting she is awaiting results of urine culture - [facility] RN reviewed labs and noted positive result and sent message to Dr Linford Arnold who confirmed positive culture and will send script for antibiotic. Patient voiced appreciation as did MD)  RNCM  Clinical Goal(s): 01/27/24 30 day program complete - patient feels she has been educated and understands to reach out to MD with new questions/concerns and feels she is well supported by her PCP Patient will work with the Care Management team over the next 30 days to address Transition of Care Barriers:PCP hospital follow up and plan for lumbar surgery (01/27/24 30 days complete and patient does not feel she needs continued calls and will reach out to her PCP if needed-no plans for surgery at this time) Patient will take all medications exactly as prescribed and will call provider for medication related questions as evidenced by patient report and health record - 01/27/24 reconciled with patient Patient will attend all scheduled medical appointments: 01/18/24 Patient reports cardiology appointment is 01/17/24 and VSS but hepatic function was elevated - 01/27/24 reviewed with patient closing TOC program and patient will contact MD moving forward with any new questions/concerns  Interventions: Evaluation of current treatment plan related to  self management and patient's adherence to plan as established by provider  (01/27/24 30 days complete and patient does not feel she needs continued calls and will reach out to her PCP if needed)  Transitions of Care: Goal Met Doctor Visits  - discussed the importance of doctor visits -  (01/27/24 Patient had B12 injection and urine test at PCP office - Urine culture positive and patient  is being sent new prescription of antibiotic - Patient understands to contact PCP if symptoms worsen or do not improve - Patient also reports she is picking up CPAP next week and feels she does not need further calls - 30 day program being closed  Patient Goals/Self-Care Activities: Completion of 30 day TOC program Participate in Transition of Care Program/Attend [facility] scheduled calls Notify RN Care Manager of TOC call rescheduling needs Take all medications as prescribed - 01/18/24 medications  reviewed with PCP today and with [facility] RN Attend all scheduled provider appointments - patient to call and schedule PCP hospital follow up - Patient will have urine rechecked as instructed  Patient will continue using rollator as directed  Follow Up Plan:  Patient has completed 30 day TOC Program and denies need for further calls - will reach out to PCP with any new or worsening symptoms The patient has been provided with contact information for the care management team and has been advised to call with any health related questions or concerns.          Plan: Closing TOC 30 day program  The patient has been provided with contact information for the care management team and has been advised to call with any health related questions or concerns.   Hilbert Odor RN, CCM [place]  VBCI-Population Health RN Care Manager [phone]

## 2024-01-27 NOTE — Addendum Note (Signed)
 Addended by: Nani Gasser D on: 01/27/2024 11:35 AM   Modules accepted: Orders

## 2024-01-27 NOTE — Patient Instructions (Signed)
 Visit Information  Thank you for taking time to visit with me today. Please don't hesitate to contact me if I can be of assistance to you.   Following is a copy of your care plan:   Goals Addressed               This Visit's Progress     COMPLETED: TOC Care Plan - Patient will report no readmissions in the next 30 days (pt-stated)        Current Barriers:  Patient was unaware of need to schedule PCP hospital follow up 01/18/24 Patient completed Cardiology appointment as scheduled 01/17/24 - had blood work, ALT, AST, Alkaline elevated - educated patient if they don't call her then she should call them to discuss - All levels increased further from 11/19/23 where they were already higher than normal - Patient will also have repeat urine - Patient previously said MD checking on CPAP and 3/25 patient states she is calling the company again today because they said they are waiting on something from MD - (01/27/24 patient reporting she is awaiting results of urine culture - TOC RN reviewed labs and noted positive result and sent message to Dr Linford Arnold who confirmed positive culture and will send script for antibiotic. Patient voiced appreciation as did MD)  RNCM Clinical Goal(s): 01/27/24 30 day program complete - patient feels she has been educated and understands to reach out to MD with new questions/concerns and feels she is well supported by her PCP Patient will work with the Care Management team over the next 30 days to address Transition of Care Barriers:PCP hospital follow up and plan for lumbar surgery (01/27/24 30 days complete and patient does not feel she needs continued calls and will reach out to her PCP if needed-no plans for surgery at this time) Patient will take all medications exactly as prescribed and will call provider for medication related questions as evidenced by patient report and health record - 01/27/24 reconciled with patient Patient will attend all scheduled medical appointments:  01/18/24 Patient reports cardiology appointment is 01/17/24 and VSS but hepatic function was elevated - 01/27/24 reviewed with patient closing TOC program and patient will contact MD moving forward with any new questions/concerns  Interventions: Evaluation of current treatment plan related to  self management and patient's adherence to plan as established by provider  (01/27/24 30 days complete and patient does not feel she needs continued calls and will reach out to her PCP if needed)  Transitions of Care: Goal Met Doctor Visits  - discussed the importance of doctor visits -  (01/27/24 Patient had B12 injection and urine test at PCP office - Urine culture positive and patient  is being sent new prescription of antibiotic - Patient understands to contact PCP if symptoms worsen or do not improve - Patient also reports she is picking up CPAP next week and feels she does not need further calls - 30 day program being closed  Patient Goals/Self-Care Activities: Completion of 30 day TOC program Participate in Transition of Care Program/Attend [facility] scheduled calls Notify RN Care Manager of TOC call rescheduling needs Take all medications as prescribed - 01/18/24 medications reviewed with PCP today and with [facility] RN Attend all scheduled provider appointments - patient to call and schedule PCP hospital follow up - Patient will have urine rechecked as instructed  Patient will continue using rollator as directed  Follow Up Plan:  Patient has completed 30 day TOC Program and denies need for further calls -  will reach out to PCP with any new or worsening symptoms The patient has been provided with contact information for the care management team and has been advised to call with any health related questions or concerns.          Patient verbalizes understanding of instructions and care plan provided today and agrees to view in MyChart. Active MyChart status and patient understanding of how to access instructions and  care plan via MyChart confirmed with patient.     The patient has been provided with contact information for the care management team and has been advised to call with any health related questions or concerns.    Please call the Suicide and Crisis Lifeline: 988 call the [place] National Suicide Prevention Lifeline: 620-276-5059 or TTY: (605)255-0753 TTY (484)110-1849) to talk to a trained counselor call 1-800-273-TALK (toll free, 24 hour hotline) call 911 if you are experiencing a Mental Health or Behavioral Health Crisis or need someone to talk to.  Hilbert Odor RN, CCM [place]  VBCI-Population Health RN Care Manager [phone]

## 2024-01-28 DIAGNOSIS — I471 Supraventricular tachycardia, unspecified: Secondary | ICD-10-CM | POA: Diagnosis not present

## 2024-01-28 DIAGNOSIS — I5032 Chronic diastolic (congestive) heart failure: Secondary | ICD-10-CM | POA: Diagnosis not present

## 2024-01-28 DIAGNOSIS — M797 Fibromyalgia: Secondary | ICD-10-CM | POA: Diagnosis not present

## 2024-01-28 DIAGNOSIS — I2510 Atherosclerotic heart disease of native coronary artery without angina pectoris: Secondary | ICD-10-CM | POA: Diagnosis not present

## 2024-01-28 DIAGNOSIS — E119 Type 2 diabetes mellitus without complications: Secondary | ICD-10-CM | POA: Diagnosis not present

## 2024-01-28 DIAGNOSIS — I110 Hypertensive heart disease with heart failure: Secondary | ICD-10-CM | POA: Diagnosis not present

## 2024-01-28 NOTE — Progress Notes (Signed)
 Mia [name], we already did an ultrasound last year on your liver because of the persistent elevation but it did go up a little higher this time.  Just make sure you are avoiding any alcohol or Tylenol products.  I think at this point the neck step would be to get you in with GI.  Do you feel comfortable with that.  Do you already have a GI specialist?

## 2024-01-29 DIAGNOSIS — E785 Hyperlipidemia, unspecified: Secondary | ICD-10-CM | POA: Diagnosis not present

## 2024-01-29 DIAGNOSIS — E079 Disorder of thyroid, unspecified: Secondary | ICD-10-CM | POA: Diagnosis not present

## 2024-01-29 DIAGNOSIS — E119 Type 2 diabetes mellitus without complications: Secondary | ICD-10-CM | POA: Diagnosis not present

## 2024-01-29 DIAGNOSIS — M1990 Unspecified osteoarthritis, unspecified site: Secondary | ICD-10-CM | POA: Diagnosis not present

## 2024-01-29 DIAGNOSIS — I2510 Atherosclerotic heart disease of native coronary artery without angina pectoris: Secondary | ICD-10-CM | POA: Diagnosis not present

## 2024-01-29 DIAGNOSIS — G43909 Migraine, unspecified, not intractable, without status migrainosus: Secondary | ICD-10-CM | POA: Diagnosis not present

## 2024-01-29 DIAGNOSIS — I5032 Chronic diastolic (congestive) heart failure: Secondary | ICD-10-CM | POA: Diagnosis not present

## 2024-01-29 DIAGNOSIS — I110 Hypertensive heart disease with heart failure: Secondary | ICD-10-CM | POA: Diagnosis not present

## 2024-01-29 DIAGNOSIS — Z96651 Presence of right artificial knee joint: Secondary | ICD-10-CM | POA: Diagnosis not present

## 2024-01-29 DIAGNOSIS — Z9181 History of falling: Secondary | ICD-10-CM | POA: Diagnosis not present

## 2024-01-29 DIAGNOSIS — E871 Hypo-osmolality and hyponatremia: Secondary | ICD-10-CM | POA: Diagnosis not present

## 2024-01-29 DIAGNOSIS — G4730 Sleep apnea, unspecified: Secondary | ICD-10-CM | POA: Diagnosis not present

## 2024-01-29 DIAGNOSIS — M069 Rheumatoid arthritis, unspecified: Secondary | ICD-10-CM | POA: Diagnosis not present

## 2024-01-29 DIAGNOSIS — Z981 Arthrodesis status: Secondary | ICD-10-CM | POA: Diagnosis not present

## 2024-01-29 DIAGNOSIS — M797 Fibromyalgia: Secondary | ICD-10-CM | POA: Diagnosis not present

## 2024-01-29 DIAGNOSIS — Z87440 Personal history of urinary (tract) infections: Secondary | ICD-10-CM | POA: Diagnosis not present

## 2024-01-29 DIAGNOSIS — I471 Supraventricular tachycardia, unspecified: Secondary | ICD-10-CM | POA: Diagnosis not present

## 2024-01-29 DIAGNOSIS — Z860100 Personal history of colon polyps, unspecified: Secondary | ICD-10-CM | POA: Diagnosis not present

## 2024-01-31 ENCOUNTER — Telehealth: Payer: Self-pay | Admitting: Family Medicine

## 2024-01-31 NOTE — Telephone Encounter (Signed)
 Attempted call to patient. Left a voice mail message requesting a return call.

## 2024-01-31 NOTE — Telephone Encounter (Signed)
 Patietn informed of lab results.

## 2024-01-31 NOTE — Telephone Encounter (Signed)
 Copied from CRM [id number]. Topic: General - Other >> Jan 31, 2024  8:47 AM Maree Krabbe H wrote: Reason for CRM: Patient returned a call and said a message was left about her labs, I reviewed the chart and only seen labs that were already reviewed with the patient, not sure who called but the call back number is [phone].

## 2024-02-01 ENCOUNTER — Inpatient Hospital Stay: Admit: 2024-02-01 | Payer: MEDICARE | Primary: Internal Medicine

## 2024-02-01 ENCOUNTER — Encounter: Admit: 2024-02-01 | Discharge: 2024-02-01 | Payer: BLUE CROSS/BLUE SHIELD | Primary: Internal Medicine

## 2024-02-01 DIAGNOSIS — E785 Hyperlipidemia, unspecified: Secondary | ICD-10-CM

## 2024-02-01 LAB — BASIC METABOLIC PANEL
Anion Gap: 4 mmol/L (ref 3.0–18)
BUN/Creatinine Ratio: 13 (ref 12–20)
BUN: 9 mg/dL (ref 7.0–18)
CO2: 30 mmol/L (ref 21–32)
Calcium: 9.1 mg/dL (ref 8.5–10.1)
Chloride: 104 mmol/L (ref 100–111)
Creatinine: 0.72 mg/dL (ref 0.6–1.3)
Est, Glom Filt Rate: 89 mL/min/{1.73_m2} (ref >60)
Glucose: 114 mg/dL — ABNORMAL HIGH (ref 74–99)
Potassium: 4 mmol/L (ref 3.5–5.5)
Sodium: 138 mmol/L (ref 136–145)

## 2024-02-01 LAB — TSH + FREE T4 PANEL
T4 Free: 1 ng/dL (ref 0.7–1.5)
TSH, 3rd Generation: 2.05 u[IU]/mL (ref 0.36–3.74)

## 2024-02-01 LAB — HEMOGLOBIN A1C W/O EAG: Hemoglobin A1C: 5.7 % — ABNORMAL HIGH (ref 4.2–5.6)

## 2024-02-02 ENCOUNTER — Ambulatory Visit: Payer: Medicare Other | Admitting: Rheumatology

## 2024-02-03 DIAGNOSIS — I110 Hypertensive heart disease with heart failure: Secondary | ICD-10-CM | POA: Diagnosis not present

## 2024-02-03 DIAGNOSIS — E119 Type 2 diabetes mellitus without complications: Secondary | ICD-10-CM | POA: Diagnosis not present

## 2024-02-03 DIAGNOSIS — I471 Supraventricular tachycardia, unspecified: Secondary | ICD-10-CM | POA: Diagnosis not present

## 2024-02-03 DIAGNOSIS — I5032 Chronic diastolic (congestive) heart failure: Secondary | ICD-10-CM | POA: Diagnosis not present

## 2024-02-03 DIAGNOSIS — I2510 Atherosclerotic heart disease of native coronary artery without angina pectoris: Secondary | ICD-10-CM | POA: Diagnosis not present

## 2024-02-03 DIAGNOSIS — M797 Fibromyalgia: Secondary | ICD-10-CM | POA: Diagnosis not present

## 2024-02-03 NOTE — Telephone Encounter (Signed)
 This has been addressed.

## 2024-02-07 ENCOUNTER — Encounter: Payer: Self-pay | Admitting: Licensed Clinical Social Worker

## 2024-02-08 ENCOUNTER — Encounter: Payer: BLUE CROSS/BLUE SHIELD | Attending: Internal Medicine | Primary: Internal Medicine

## 2024-02-08 DIAGNOSIS — D225 Melanocytic nevi of trunk: Secondary | ICD-10-CM | POA: Diagnosis not present

## 2024-02-08 DIAGNOSIS — Z85828 Personal history of other malignant neoplasm of skin: Secondary | ICD-10-CM | POA: Diagnosis not present

## 2024-02-08 DIAGNOSIS — B078 Other viral warts: Secondary | ICD-10-CM | POA: Diagnosis not present

## 2024-02-08 DIAGNOSIS — L814 Other melanin hyperpigmentation: Secondary | ICD-10-CM | POA: Diagnosis not present

## 2024-02-08 DIAGNOSIS — S76191A Other specified injury of right quadriceps muscle, fascia and tendon, initial encounter: Secondary | ICD-10-CM | POA: Diagnosis not present

## 2024-02-08 DIAGNOSIS — D235 Other benign neoplasm of skin of trunk: Secondary | ICD-10-CM | POA: Diagnosis not present

## 2024-02-08 DIAGNOSIS — L821 Other seborrheic keratosis: Secondary | ICD-10-CM | POA: Diagnosis not present

## 2024-02-08 DIAGNOSIS — L579 Skin changes due to chronic exposure to nonionizing radiation, unspecified: Secondary | ICD-10-CM | POA: Diagnosis not present

## 2024-02-08 DIAGNOSIS — L720 Epidermal cyst: Secondary | ICD-10-CM | POA: Diagnosis not present

## 2024-02-08 NOTE — Progress Notes (Unsigned)
 73 y.o. female who presents for evaluation.    She continues to see Dr Renato Gails and Dr Eulogio Ditch for the LEFT breast ca.  The neuropathy is controlled by taking the gaba at night, could not tolerate daytime dosing due to sedation    Denied any cardiovascular complaints. She has not been following her bp outside but no syncope or recurring presyncope/lightheadedness    Her husband recently had back surgery and mom is now at [facility] after she fractured the other hip and will likely be staying there now.  She is stressed out!    No gi or gu issues and the constipation issues are controlled on fiber and hydration.  Seeing Dr Isac Sarna yearly for the endomet ca    Denies polyuria, polydipsia, nocturia, vision change. Diet controlled, wt stable     01/13/2022 07/16/2022 01/12/2023 06/02/2023 08/10/2023   Vitals        Weight - Scale 161 lb  162 lb  159 lb  155 lb  158 lb      No sx referable to the thyroid    She does report what sounds like action tremor; her dad and brother have something similar. Only bothers her when she's doing things with her arms, no resting component    LAST MEDICARE WELLNESS EXAM: 11/10/16, 11/23/17, 11/29/18, 12/14/19, 01/06/21, 01/13/22, 01/12/23    Past Medical History:   Diagnosis Date    Allergic rhinitis     Anxiety     Breast cancer (HCC) 01/2020    Dr Brock Ra; LEFT T2N0M0 invasive adenoca  ER/PR/Her2 neg Dr Fidela Juneau neoadjuvant AC-Taxol; lumpectomy and Ln dissection 10/21    Compression fx, thoracic spine (HCC) 2007    negative DEXA Dr. Parks Ranger    DM (diabetes mellitus) (HCC) 10/2017    on basis of fbs>125; intol metformin    Dyslipidemia     Endometrial cancer (HCC) 02/2017    Dr Isac Sarna; stage 1B gr 1 endometroid adenoca w foci squamous diff of the endometrium; RA TLH BSO PLND, 0/13LN, MSI intact    Familial tremor     bro/dad have it as well    Frozen shoulder     right Dr. 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 Paulina Fusi, Dr. Parks Ranger    Osteopenia     DEXA -0.5 spine, -0.8 hip (1/18); 0.5 spine, -1.3 hip (3/22); -1.1 wrist, -1.1 hip (4/23)    Overweight (BMI 25.0-29.9)     IF 7/18 start weight 168 lbs not doing     Painless hematuria 04/2016    Dr Mayford Knife, neg eval    Palpitations 2008    neg thallium 2008, nl holter 2008, echo nl lv/ef 65%/tr mr/dd/nl pasp    Syncope     neurocardiogenic by tilt 1994    TMJ syndrome 2023    LEFT after wisdon tooth surgery    Venous insufficiency     Vitamin D deficiency      Past Surgical History:   Procedure Laterality Date  BREAST LUMPECTOMY  07/2020    and LN dissection Dr Renato Gails    COLONOSCOPY      Dr Derrell Lolling (2007) neg; (04/22/18) neg    HEMORRHOID SURGERY      Dr. Derrell Lolling 2007    HYSTERECTOMY (CERVIX STATUS UNKNOWN)  03/11/2017    Dr Isac Sarna    HYSTERECTOMY, TOTAL ABDOMINAL (CERVIX REMOVED)      TUBAL LIGATION      UROLOGICAL SURGERY  06/2016    Dr Mayford Knife; bladder bx showed benign lesions    US BREAST BIOPSY W LOC DEVICE 1ST LESION LEFT Left 02/21/2020    US BREAST NEEDLE BIOPSY LEFT 02/21/2020 [facility] [place]     Social History     Socioeconomic History    Marital status: Married     Spouse name: Not on file    Number of children: 2    Years of education: Not on file    Highest education level: Not on file   Occupational History    Occupation: ret benefits specialist   Tobacco Use    Smoking status: Former     Passive exposure: Never    Smokeless tobacco: Never   Vaping Use    Vaping status: Never Used   Substance and Sexual Activity    Alcohol use: Yes     Alcohol/week: 1.0 standard drink of alcohol     Types: 1 Shots of liquor per week     Comment: One drink a week since breast cancer recovery    Drug use: No     Sexual activity: Not Currently     Partners: Male   Other Topics Concern    Not on file   Social History Narrative    Not on file     Social Drivers of Health     Financial Resource Strain: Low Risk  (08/10/2023)    Overall Financial Resource Strain (CARDIA)     Difficulty of Paying Living Expenses: Not hard at all   Food Insecurity: No Food Insecurity (08/10/2023)    Hunger Vital Sign     Worried About Running Out of Food in the Last Year: Never true     Ran Out of Food in the Last Year: Never true   Transportation Needs: Unknown (08/10/2023)    PRAPARE - Therapist, art (Medical): Not on file     Lack of Transportation (Non-Medical): No   Physical Activity: Inactive (01/12/2023)    Exercise Vital Sign     Days of Exercise per Week: 0 days     Minutes of Exercise per Session: 0 min   Stress: Not on file   Social Connections: Not on file   Intimate Partner Violence: Not on file   Housing Stability: Unknown (08/10/2023)    Housing Stability Vital Sign     Unable to Pay for Housing in the Last Year: Not on file     Number of Times Moved in the Last Year: Not on file     Homeless in the Last Year: No     Current Outpatient Medications   Medication Sig    atorvastatin (LIPITOR) 40 MG tablet take 1 tablet by mouth once daily    lansoprazole (PREVACID) 30 MG delayed release capsule take 1 capsule by mouth once daily before breakfast    citalopram (CELEXA) 10 MG tablet take 1 tablet by mouth once daily    gabapentin (NEURONTIN) 300 MG capsule Take 1 capsule by mouth daily. Max Daily  Amount: 300 mg    Probiotic CHEW Take by mouth    ELDERBERRY PO Take by mouth    JANUVIA 100 MG tablet take 1 tablet by mouth once daily    Multiple Vitamins-Minerals (MULTIVITAMIN ADULTS 50+ PO) Take 1 tablet by mouth daily    Cholecalciferol (VITAMIN D3) 1.25 MG (50000 UT) CAPS Take 1 capsule by mouth once a week    Lancets MISC Use daily as directed    aspirin 81 MG chewable tablet Take 1 tablet by mouth daily     No  current facility-administered medications for this visit.     Allergies   Allergen Reactions    Lidocaine Other (See Comments)     anxious    Metformin Diarrhea    Nabumetone Nausea Only    Thimerosal (Thiomersal) Hives     REVIEW OF SYSTEMS:  mammo 4/23, colo 6/19 Dr Derrell Lolling, DEXA 3/22    There were no vitals filed for this visit.  Affect is appropriate.  Mood stable  No apparent distress  HEENT --Anicteric sclerae.  No JVD, or bruits.  Thyroid fullness on left  Lungs --Clear to auscultation and percussion, normal percussion.  Heart --Regular rate and rhythm, no murmurs, rubs, gallops, or clicks.  Abdomen -- Soft and nontender, no hepatosplenomegaly or masses.  Extremities -- Without cyanosis, clubbing, tr ankle edema notd. 2+ pulses equally and bilaterally.  Varicose veins bilat  Mild action tremor noted intermittently bilat hands    LABS  From 5/10 showed   gluc 110, cr 0.70,               alt 23,                                   chol 154, tg 143, hdl 45, ldl-c 80,                                                            tsh 1.91  From 5/10 showed                       2 hr GTT 89  From 8/10 showed                                                                vit d 23.0, ck 57, aldo 5.4  From 8/11 showed   gluc 108,                                     hba1c 6.3,                   chol 160, tg 172, hdl 39, ldl-c 87,  wbc 5.,7 hb 12.3, plt 235, ua neg,     tsh 2.28  From 5/12 showed   gluc 113, cr 0.71, gfr 94,  alt 16, hba1c 6.2, ldl-p 1989, chol 177, tg 161, hdl 43, ldl-c  102  From 11/12 showed                                                     hba1c 5.9, ldl-p 1218, chol 133, tg 116, hdl 45, ldl-c 65  From 5/13 showed   gluc 105, cr 0.55, gfr 102, alt 8,  hba1c 6.2,                   chol 148, tg 107, hdl 45, ldl-c 82,  wbc 5.0, hb 12.5, plt 172, vit d 40.3  From 11/13 showed         hba1c 6.2, ldl-p 1888, chol 176, tg 155, hdl 49, ldl-c 86,  wbc 6.2, hb 12.3, plt 182, vit d 34.4  From 5/14 showed          hba1c 6.2,      chol 152, tg 157, hdl 39, ldl-c 82  From 1/15 showed   gluc 113, cr 0.68, gfr>60, alt 11, hba1c 6.2,      chol 146, tg 130, hdl 43, ldl-c 77  From 7/15 showed         hba1c 6.3,      chol 133, tg 124, hdl 39, ldl-c 69,  wbc 6.3, hb 12.2, plt 193  From 6/16 showed   gluc 106, cr 0.74, gfr>60, alt 26, hba1c 6.3,      chol 126, tg 125, hdl 46, ldl-c 55,  wbc 5.7, hb 13.2, plt 203, vit d 7.2,   tsh 1.69  From 12/16 showed gluc 110, cr 0.68, gfr>60, alt 30, hba1c 6.1,      chol 135, tg 147, hdl 47, ldl-c 59,  wbc 6.0, hb 12.7, plt 197  From 6/17 showed   gluc 122, cr 0.71, gfr>60, alt 33, hba1c 6.2,      chol 153, tg 140, hdl 46, ldl-c 79,  wbc 6.3, hb 13.1, plt 190,           tsh 1.92, hep c-  From 1/18 showed         hba1c 6.2,     chol 154, tg 181, hdl 41, ldl-c 77  From 7/18 showed   gluc 129, cr 0.63, gfr>60, alt 26, hba1c 6.1,                 wbc 6.0, hb 12.4, plt 193, vit d 79  From 1/19 showed   gluc 132, cr 0.83, gfr>60,     hba1c 6.1  From 4/19 showed         hba1c 7.0, umar 12 ,   chol 124, tg 109, hdl 46, ldl-c 56,                    vit d 56.1  From 7/19 showed   gluc 106, cr 0.67, gfr>60, alt 25, hba1c 6.0,                 wbc 5.8, hb 13.0, plt 183  From 1/20 showed        hba1c 6.0, umar 17,    chol 130, tg 113, hdl 46, ldl-c 61  From 8/20 showed   gluc 103, cr 0.73, gfr>60, alt 27, hba1c 5.7,       chol 124, tg 150, hdl 44, ldl-c 50, wbc 5.0, hb13.0, plt 190  Form 2/21 showed         hba1c 5.9, umar na,             vit d 88.9  From 8/21 showed   gluc 109, cr 0.68, gfr>60, alt 35, hba1c 5.5,       chol 156, tg 142, hdl 51, ldl-c 77, wbc 4.2, hb 9.9,   plt 152, vit d 34.8  From 3/22 showed   gluc 100, cr 0.68, gfr>60, alt 22, hba1c 5.7,       chol 129, tg 137, hdl 48, ldl-c 54  From 9/22 showed   gluc 110, cr 0.72, gfr>60, alt 24, hba1c 5.8,       chol 128, tg 129, hdl 44, ldl-c 58, wbc 5.6, hb 12.0, plt 184  From 3/23 showed   gluc 115, cr 0.65, gfr>60,     hba1c 5.7,               vit d  93.3  From 3/24 showed   gluc 120, cr 0.70, gfr>60,     hba1c 5.7  From 10/24 showed gluc 108, cr 0.78, gfr>60, alt 16, hba1c 5.9, umar 22,     chol 114, tg 135, hdl 46, ldl-c 41, wbc 8.4, hb 12.8, plt 208    No results found for this or any previous visit (from the past 2160 hours).    We reviewed the patient's labs from the last several visits to point out trends in the numbers        Patient Active Problem List   Diagnosis    Dyslipidemia    Anxiety    Vitamin D deficiency    History of breast cancer    Neuropathy due to chemotherapeutic drug    Overweight with body mass index (BMI) of 25 to 25.9 in adult    Arthritis, degenerative    Controlled type 2 diabetes mellitus, without long-term current use of insulin (HCC)    Familial tremor       Assessment and plan:  1.  DM.  Continue januvia and controlled.  F/u ophth  2.  Hyperlipidemia. Continue lipitor   3.  Hypovitaminosis D. Supplementation per onco  4.  Anxiety.  Continue celexa  5.  Overweight.  Lifestyle and dietary measures.  Portion control reiterated.  6.  Endometrial ca.  Per Dr Isac Sarna  7.  Breast ca.  Per Dr Fidela Juneau and Dr Renato Gails  8.  Possible neuropathy from chemo.  Continue gaba  9.  Venous disease.  Elevate, declined diuretic  10.  Prevnar 20 given.  Advocated RSV  11.  Familiar tremor.  Quick discussion, declined meds or neuro consult and will observe      RTC 4/25    Above conditions discussed at length and patient vocalized understanding.  All questions answered to patient satisfaction    {No diagnosis found. (Refresh or delete this SmartLink)}

## 2024-02-09 ENCOUNTER — Ambulatory Visit: Payer: Self-pay | Admitting: Licensed Clinical Social Worker

## 2024-02-09 NOTE — Patient Outreach (Signed)
 Complex Care Management   Visit Note  02/09/2024  Name:  Mia [name] MRN: [id number] DOB: June 11, 1951  Situation: Referral received for Complex Care Management related to  client management of ongoing medical needs  I obtained verbal consent from Patient.  Visit completed with patient  on the phone  Background:   Past Medical History:  Diagnosis Date   Anal fissure    Anemia    Arthritis    Blood transfusion    C. difficile colitis    Chest pain    a. 01/2013 MV: EF 59%, no ischemia.   Chronic Dyspnea    a. 01/2013 Echo: EF 60-65%, Gr 1 DD, PASP 61mmHg.// Echo 01/2020: EF 60-65, no RWMA, GR 1 DD, GLS -21.6%, normal RV SF, trivial MR    COVID-19    DDD (degenerative disc disease), cervical    DDD (degenerative disc disease), lumbar    Diabetes mellitus    DVT (deep venous thrombosis) (HCC) 06/23/2023   left lower extremity   Fibromyalgia    Gall stones    GERD (gastroesophageal reflux disease)    gastritis   Hemorrhoids    Hypertension    Interstitial cystitis    MCI (mild cognitive impairment) 11/25/2017   Memory difficulty 12/14/2013   Neuromuscular disorder (HCC)    sclerosis   Osteoporosis    Peptic ulcer    PONV (postoperative nausea and vomiting)    Pseudogout    Raynaud phenomenon    Rectal bleeding    Sleep apnea    a. on cpap.   Stress fracture of left tibia 11/05/2016   SVT (supraventricular tachycardia) (HCC)    Syncope 09/10/2015   Thyroid disease    hypothyroidism   Tremor, essential 05/14/2016    Assessment: Patient Reported Symptoms:  Cognitive    Memory issues. Uses calendar to remind her of upcoming appointments    Neurological  Fibromyalgia    HEENT    No issues noted    Cardiovascular  DVT; anemia Weight: 163 lb (73.9 kg)  Respiratory  Dyspnea    Endocrine  B12 deficiency    Gastrointestinal    GERD    Genitourinary  No issues noted    Integumentary    No issues noted  Musculoskeletal      Some walking challenges. Has right  knee pain. Uses a walker to help her walk    Psychosocial  Has great support from spouse and from her family; no transport issues. No food issues. Mood stable (per client) Has prescribed medications. Good home environment     Quality of Family Relationships: supportive, helpful Do you feel physically threatened by others?: No      02/09/2024   10:27 AM  Depression screen PHQ 2/9  Decreased Interest 0  Down, Depressed, Hopeless 0  PHQ - 2 Score 0    Vitals:  No BP issues per client report  Medications Reviewed Today     Reviewed by Afton Horse (Social Worker) on 02/09/24 at 1026  Med List Status: <None>   Medication Order Taking? Sig Documenting Provider Last Dose Status Informant  acetaminophen (TYLENOL) 500 MG tablet [id number] No Take 2,000 mg by mouth every 6 (six) hours as needed (pain.). [provider] Taking Active Self  albuterol (VENTOLIN HFA) 108 (90 Base) MCG/ACT inhaler [id number] No Inhale 1-2 puffs into the lungs every 6 (six) hours as needed for shortness of breath or wheezing. [provider] Taking Active Self  AMBULATORY NON FORMULARY MEDICATION [id number]  No Medication Name: CPAP with humidifier set to 5 to 20 cm of water pressure.  Fullface mask.  Please fax download back to [phone] 3:06 weeks.  Patient not taking: Reported on 01/27/2024   Agapito Games, MD Not Taking Active              amLODipine (NORVASC) 5 MG tablet [id number] No Take 1 tablet (5 mg total) by mouth daily. Sharlene Dory, PA-C Taking Active   colestipol (COLESTID) 1 g tablet [id number] No Take 1 g by mouth daily at 12 noon. (1100) [provider] Taking Active Self  cyanocobalamin (VITAMIN B12) 1000 MCG/ML injection [id number] No Inject 1,000 mcg into the muscle every 28 (twenty-eight) days. [provider] Taking Active Self  donepezil (ARICEPT) 10 MG tablet [id number] No TAKE 1 TABLET IN THE EVENING AT BEDTIME Butch Penny, NP Taking  Active Self  gabapentin (NEURONTIN) 300 MG capsule [id number] No TAKE 1 CAPSULE BY MOUTH EVERY MORNING AND AT BEDTIME Agapito Games, MD Taking Active   ipratropium (ATROVENT) 0.06 % nasal spray [id number] No Place 2 sprays into both nostrils 2 (two) times daily as needed for rhinitis. [provider] Taking Active Self  leflunomide (ARAVA) 20 MG tablet [id number] No TAKE 1 TABLET BY MOUTH EVERY DAY Deveshwar, Janalyn Rouse, MD Taking Active   Melatonin 10 MG CAPS [id number] No Take 10 mg by mouth at bedtime. [provider] Taking Active Self  memantine (NAMENDA) 10 MG tablet [id number] No TAKE 1 TABLET BY MOUTH TWO TIMES A DAY MUST SEE DR FOR FURTHER REFILLS Butch Penny, NP Taking Active   metoprolol tartrate (LOPRESSOR) 100 MG tablet [id number] No Take 1 tablet (100 mg total) by mouth 2 (two) times daily. Sharlene Dory, PA-C Taking Active   ondansetron (ZOFRAN-ODT) 8 MG disintegrating tablet [id number] No Take 8 mg by mouth every 8 (eight) hours as needed for nausea or vomiting.  Patient not taking: Reported on 01/18/2024   [provider] Not Taking Active Self  pantoprazole (PROTONIX) 20 MG tablet [id number] No Take 20 mg by mouth daily before breakfast. [provider] Taking Active Self  primidone (MYSOLINE) 50 MG tablet [id number] No TAKE 1 TABLET BY MOUTH EVERYDAY AT BEDTIME Huston Foley, MD Taking Active   rosuvastatin (CRESTOR) 5 MG tablet [id number] No Take 1 tablet (5 mg total) by mouth daily. Sharlene Dory, PA-C Taking Active   sodium chloride (MURO 128) 5 % ophthalmic solution [id number] No Place 1 drop into both eyes in the morning, at noon, in the evening, and at bedtime. [provider] Taking Active Self  sucralfate (CARAFATE) 1 g tablet [id number] No Take 1 g by mouth 4 (four) times daily. Patient reports every 6 hours as needed for GI burning by GI, Dr Concepcion Elk [provider] Taking Active   sulfamethoxazole-trimethoprim  (BACTRIM DS) 800-160 MG tablet [id number]  Take 1 tablet by mouth 2 (two) times daily. Agapito Games, MD  Active   valsartan (DIOVAN) 160 MG tablet [id number] No Take 1 tablet (160 mg total) by mouth 2 (two) times daily. Sharlene Dory, PA-C Taking Active   venlafaxine XR (EFFEXOR-XR) 75 MG 24 hr capsule [id number] No TAKE 1 CAPSULE DAILY WITH BREAKFAST Butch Penny, NP Taking Active Self            Recommendation:   Client to attend scheduled medical appointments Client to meet tomorrow with Orthopedist as scheduled to discuss right knee pain issues of client Client to take medications as  prescribed Client to practice self care Client to socialize with friends as a way to relax Client to allow extra time for ADLs completion  Follow Up Plan:   Client has LCSW name and phone number. LCSW has encouraged client to call LCSW as needed for SW support at [phone]   Alexandria Angel  MSW, LCSW [place] Licensed Clinical Social Worker Direct Dial:  [phone] Fax:  [phone] Website:  [email or web address]

## 2024-02-09 NOTE — Patient Instructions (Signed)
 Visit Information  Thank you for taking time to visit with me today. Please don't hesitate to contact me if I can be of assistance to you before our next scheduled appointment.  LCSW has provided client with LCSW name and phone number and encouraged client to call LCSW as needed for SW support at [phone]  Please call the care guide team at [phone] if you need to cancel or reschedule your appointment.   Following is a copy of your care plan:   Goals Addressed             This Visit's Progress    [facility] Social Work Care Plan       Problems:  Managing knee pain issues on right knee. Finding out cause of right knee pain Allow time for ADLs completion Walks with use of walker as needed  CSW Clinical Goal(s):   Over the next 30 days , Amandajo will attend scheduled medical appointment AEB patient report and EPIC record documentation                Over next 30 days, patient will practice self care AEB allowing time to rest, eat meals on schedule, take medications as prescribed, socializing with friends to help her relax.  Interventions:  Discussed medication procurement of client             Discussed family support for client. Her spouse is support. Her family is supportive             Discussed transport needs of client. She has support from spouse with transport needs             Discussed pain issues of client . She spoke of right knee pain issues                              Discussed upcoming client medical appointments. She has appointment tomorrow with Orthopedist to discuss right knee pain issues          Discussed vision of client; discussed sleeping issues of client. Discussed appetite of client          Discussed mood of client. She feels that her mood is stable at present          Discussed memory issues. She uses calendar to help her remember her appointments. She writes notes to self to remind her of activities to complete           Discussed ADLs completion.            Discussed support with PCP, Dr. Greer Leak.           Provided counseling support for client           Encouraged client to call LCSW as needed for SW support at [phone]  Patient Goals/Self-Care Activities:  Continue taking your medication as prescribed.               Attend scheduled medical appointments in next 30 days             Get adequate rest, eat meals as scheduled, spend time socializing with friends to help you relax and manage stress issues             Communicate as needed with medical providers to help her manage her medical needs  Plan:   The patient has been provided with contact information for the care management team and has been advised to call  with any health related questions or concerns.         Please go to [facility] [address] [place] [phone]) if you are experiencing a Mental Health or Behavioral Health Crisis or need someone to talk to.  The patient verbalized understanding of instructions, educational materials, and care plan provided today and DECLINED offer to receive copy of patient instructions, educational materials, and care plan.   Patient has been provided information on how to contact Care team for support. LCSW has given client LCSW name and phone number and encouraged client to call LCSW as needed for SW support at [phone]   Alexandria Angel  MSW, LCSW [place] Licensed Clinical Social Worker Direct Dial:  [phone] Fax:  [phone] Website:  [email or web address]

## 2024-02-10 DIAGNOSIS — Z471 Aftercare following joint replacement surgery: Secondary | ICD-10-CM | POA: Diagnosis not present

## 2024-02-10 DIAGNOSIS — Z96651 Presence of right artificial knee joint: Secondary | ICD-10-CM | POA: Diagnosis not present

## 2024-02-10 DIAGNOSIS — I2510 Atherosclerotic heart disease of native coronary artery without angina pectoris: Secondary | ICD-10-CM | POA: Diagnosis not present

## 2024-02-10 DIAGNOSIS — I5032 Chronic diastolic (congestive) heart failure: Secondary | ICD-10-CM | POA: Diagnosis not present

## 2024-02-10 DIAGNOSIS — M797 Fibromyalgia: Secondary | ICD-10-CM | POA: Diagnosis not present

## 2024-02-10 DIAGNOSIS — M1712 Unilateral primary osteoarthritis, left knee: Secondary | ICD-10-CM | POA: Diagnosis not present

## 2024-02-10 DIAGNOSIS — M25561 Pain in right knee: Secondary | ICD-10-CM | POA: Diagnosis not present

## 2024-02-10 DIAGNOSIS — I471 Supraventricular tachycardia, unspecified: Secondary | ICD-10-CM | POA: Diagnosis not present

## 2024-02-10 DIAGNOSIS — E119 Type 2 diabetes mellitus without complications: Secondary | ICD-10-CM | POA: Diagnosis not present

## 2024-02-10 DIAGNOSIS — I110 Hypertensive heart disease with heart failure: Secondary | ICD-10-CM | POA: Diagnosis not present

## 2024-02-11 ENCOUNTER — Other Ambulatory Visit: Payer: Self-pay | Admitting: Adult Health

## 2024-02-12 ENCOUNTER — Other Ambulatory Visit: Payer: Self-pay | Admitting: Rheumatology

## 2024-02-12 ENCOUNTER — Encounter: Payer: Self-pay | Admitting: Family Medicine

## 2024-02-12 DIAGNOSIS — R42 Dizziness and giddiness: Secondary | ICD-10-CM

## 2024-02-14 ENCOUNTER — Other Ambulatory Visit: Payer: Self-pay | Admitting: Physician Assistant

## 2024-02-14 MED ORDER — LEFLUNOMIDE 10 MG PO TABS: ORAL_TABLET | ORAL | 0 refills | Status: DC

## 2024-02-14 MED ORDER — MECLIZINE HCL 25 MG PO TABS: 25.0000 mg | ORAL_TABLET | Freq: Three times a day (TID) | ORAL | 0 refills | Status: DC | PRN

## 2024-02-14 NOTE — Telephone Encounter (Signed)
 Looks like dr Alvira Josephs advised reducing the dose of arava  to 10 mg alternating with 20 mg every other day at the last visit on 12/14/23--please change prescription accordingly

## 2024-02-14 NOTE — Telephone Encounter (Signed)
Meds ordered this encounter  Medications   meclizine (ANTIVERT) 25 MG tablet    Sig: Take 1 tablet (25 mg total) by mouth 3 (three) times daily as needed for dizziness.    Dispense:  30 tablet    Refill:  0    

## 2024-02-14 NOTE — Addendum Note (Signed)
 Addended by: Adrianne Horn on: 02/14/2024 10:35 AM   Modules accepted: Orders

## 2024-02-14 NOTE — Telephone Encounter (Signed)
 Last Fill: 11/10/2023  Labs: 01/12/2024 RDW 17.5, BUN/Creat. Ratio 10, Alk. Phos 215, AST 68, ALT 66  Next Visit: 05/25/2024  Last Visit: 12/14/2023  DX: Seronegative rheumatoid arthritis   Current Dose per office note 12/14/2023: Arava  20 mg 1 tablet by mouth daily   Okay to refill Arava  ?

## 2024-02-15 ENCOUNTER — Telehealth: Payer: Self-pay | Admitting: *Deleted

## 2024-02-15 ENCOUNTER — Ambulatory Visit
Admit: 2024-02-15 | Discharge: 2024-02-15 | Payer: BLUE CROSS/BLUE SHIELD | Attending: Internal Medicine | Primary: Internal Medicine

## 2024-02-15 VITALS — BP 94/68 | HR 94 | Temp 97.80000°F | Resp 16 | Ht 64.0 in | Wt 157.0 lb

## 2024-02-15 DIAGNOSIS — Z0000 Encounter for general adult medical examination without abnormal findings: Secondary | ICD-10-CM

## 2024-02-15 NOTE — Telephone Encounter (Signed)
 Submitted a Prior Authorization request to [facility] for Arava  via CoverMyMeds. Will update once we receive a response.

## 2024-02-15 NOTE — Progress Notes (Signed)
 73 y.o. female who presents for evaluation.    She continues to see Dr Lamount Pimple and Dr Janeice Medal for the LEFT breast ca.  The neuropathy is controlled by taking the gaba at night, flares up periodically with numbness ensations    Denied any cardiovascular complaints. She contineus top help care for her husband.  Her mom had fractures in fall and finally died in 12/24/2023 at the [facility]    No gu issues.  The constipation issues sometimes flare even with fiber and hydration.  She did have anal fissures and saw Dr Ramirez's NP and given topicals.  Seeing Dr Squatrito yearly for the endomet ca    Denies polyuria, polydipsia, nocturia, vision change. 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 Aron Bien    DM (diabetes mellitus) (HCC) December 23, 2017    on basis of fbs>125; intol metformin    Dyslipidemia     Endometrial cancer (HCC) 02/2017    Dr Rena Carnes; stage 1B gr 1 endometroid adenoca w foci squamous diff of the endometrium; RA TLH BSO PLND, 0/13LN, MSI intact    Familial tremor     bro/dad have it as well    Frozen shoulder     right Dr. 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 Jolie Neat, Dr. Aron Bien    Osteopenia      DEXA -0.5 spine, -0.8 hip (1/18); 0.5 spine, -1.3 hip (3/22); -1.1 wrist, -1.1 hip (4/23)    Overweight (BMI 25.0-29.9)     IF 7/18 start weight 168 lbs not doing     Painless hematuria 04/2016    Dr Broadus Canes, neg eval    Palpitations 2008    neg thallium 2008, nl holter 2008, echo nl lv/ef 65%/tr mr/dd/nl pasp    Syncope     neurocardiogenic by tilt 1994    TMJ syndrome 2023    LEFT after wisdon tooth surgery    Venous insufficiency     Vitamin D deficiency      Past Surgical History:   Procedure Laterality Date    BREAST LUMPECTOMY  07/2020    and LN dissection Dr Lamount Pimple    COLONOSCOPY      Dr Melton Squires (2007) neg; (04/22/18) neg    HEMORRHOID SURGERY      Dr. Melton Squires 2007    HYSTERECTOMY (CERVIX STATUS UNKNOWN)  03/11/2017  Dr Rena Carnes    HYSTERECTOMY, TOTAL ABDOMINAL (CERVIX REMOVED)      TUBAL LIGATION      UROLOGICAL SURGERY  06/2016    Dr Broadus Canes; bladder bx showed benign lesions    US  BREAST BIOPSY W LOC DEVICE 1ST LESION LEFT Left 02/21/2020    US  BREAST NEEDLE BIOPSY LEFT 02/21/2020 [facility] US      Social History     Socioeconomic History    Marital status: Married     Spouse name: Not on file    Number of children: 2    Years of education: Not on file    Highest education level: Not on file   Occupational History    Occupation: ret benefits specialist   Tobacco Use    Smoking status: Former     Passive exposure: Never    Smokeless tobacco: Never   Vaping Use    Vaping status: Never Used   Substance and Sexual Activity    Alcohol use: Yes     Alcohol/week: 1.0 standard drink of alcohol     Types: 1 Shots of liquor per week     Comment: One drink a week since breast cancer recovery    Drug use: No    Sexual activity: Not Currently     Partners: Male   Other Topics Concern    Not on file   Social History Narrative    Not on file     Social Drivers of Health     Financial Resource Strain: Low Risk  (08/10/2023)    Overall Financial Resource Strain (CARDIA)     Difficulty of Paying Living Expenses: Not hard at  all   Food Insecurity: No Food Insecurity (02/14/2024)    Hunger Vital Sign     Worried About Running Out of Food in the Last Year: Never true     Ran Out of Food in the Last Year: Never true   Transportation Needs: No Transportation Needs (02/14/2024)    PRAPARE - Therapist, art (Medical): No     Lack of Transportation (Non-Medical): No   Physical Activity: Unknown (02/14/2024)    Exercise Vital Sign     Days of Exercise per Week: 0 days     Minutes of Exercise per Session: Patient declined   Stress: Not on file   Social Connections: Not on file   Intimate Partner Violence: Not on file   Housing Stability: Low Risk  (02/14/2024)    Housing Stability Vital Sign     Unable to Pay for Housing in the Last Year: No     Number of Times Moved in the Last Year: 0     Homeless in the Last Year: No     Current Outpatient Medications   Medication Sig    atorvastatin  (LIPITOR) 40 MG tablet take 1 tablet by mouth once daily    lansoprazole  (PREVACID ) 30 MG delayed release capsule take 1 capsule by mouth once daily before breakfast    citalopram  (CELEXA ) 10 MG tablet take 1 tablet by mouth once daily    gabapentin  (NEURONTIN ) 300 MG capsule Take 1 capsule by mouth daily.    Probiotic CHEW Take by mouth    ELDERBERRY PO Take by mouth    JANUVIA  100 MG tablet take 1 tablet by mouth once daily    Multiple Vitamins-Minerals (MULTIVITAMIN ADULTS 50+ PO) Take 1 tablet by mouth daily    Cholecalciferol (VITAMIN D3) 1.25 MG (50000 UT) CAPS  Take 1 capsule by mouth once a week    Lancets MISC Use daily as directed    aspirin 81 MG chewable tablet Take 1 tablet by mouth daily     No current facility-administered medications for this visit.     Allergies   Allergen Reactions    Lidocaine Other (See Comments)     anxious    Metformin Diarrhea    Nabumetone Nausea Only    Thimerosal (Thiomersal) Hives     REVIEW OF SYSTEMS:  mammo 5/24, colo 6/19 Dr Melton Squires, DEXA 3/24    Vitals:    02/15/24 0851   BP: 94/68   Pulse: 94    Resp: 16   Temp: 97.8 F (36.6 C)   TempSrc: Temporal   SpO2: 96%   Weight: 71.2 kg (157 lb)   Height: 1.626 m (5\' 4" )   Affect is appropriate.  Mood stable  No apparent distress  HEENT --Anicteric sclerae.  No JVD, or bruits.  Thyroid fullness on left  Lungs --Clear to auscultation and percussion, normal percussion.  Heart --Regular rate and rhythm, no murmurs, rubs, gallops, or clicks.  Abdomen -- Soft and nontender, no hepatosplenomegaly or masses.  Extremities -- Without cyanosis, clubbing, tr ankle edema notd. 2+ pulses equally and bilaterally.  Varicose veins bilat  Mild action tremor noted intermittently bilat hands    LABS  From 5/10 showed   gluc 110, cr 0.70,               alt 23,                                   chol 154, tg 143, hdl 45, ldl-c 80,                                                            tsh 1.91  From 5/10 showed                       2 hr GTT 89  From 8/10 showed                                                                vit d 23.0, ck 57, aldo 5.4  From 8/11 showed   gluc 108,                                     hba1c 6.3,                   chol 160, tg 172, hdl 39, ldl-c 87,  wbc 5.,7 hb 12.3, plt 235, ua neg,     tsh 2.28  From 5/12 showed   gluc 113, cr 0.71, gfr 94,  alt 16, hba1c 6.2, ldl-p 1989, chol 177, tg 161, hdl 43, ldl-c 102  From 11/12 showed  hba1c 5.9, ldl-p 1218, chol 133, tg 116, hdl 45, ldl-c 65  From 5/13 showed   gluc 105, cr 0.55, gfr 102, alt 8,  hba1c 6.2,                   chol 148, tg 107, hdl 45, ldl-c 82,  wbc 5.0, hb 12.5, plt 172, vit d 40.3  From 11/13 showed         hba1c 6.2, ldl-p 1888, chol 176, tg 155, hdl 49, ldl-c 86,  wbc 6.2, hb 12.3, plt 182, vit d 34.4  From 5/14 showed         hba1c 6.2,      chol 152, tg 157, hdl 39, ldl-c 82  From 1/15 showed   gluc 113, cr 0.68, gfr>60, alt 11, hba1c 6.2,      chol 146, tg 130, hdl 43, ldl-c 77  From 7/15 showed         hba1c 6.3,      chol 133, tg  124, hdl 39, ldl-c 69,  wbc 6.3, hb 12.2, plt 193  From 6/16 showed   gluc 106, cr 0.74, gfr>60, alt 26, hba1c 6.3,      chol 126, tg 125, hdl 46, ldl-c 55,  wbc 5.7, hb 13.2, plt 203, vit d 7.2,   tsh 1.69  From 12/16 showed gluc 110, cr 0.68, gfr>60, alt 30, hba1c 6.1,      chol 135, tg 147, hdl 47, ldl-c 59,  wbc 6.0, hb 12.7, plt 197  From 6/17 showed   gluc 122, cr 0.71, gfr>60, alt 33, hba1c 6.2,      chol 153, tg 140, hdl 46, ldl-c 79,  wbc 6.3, hb 13.1, plt 190,           tsh 1.92, hep c-  From 1/18 showed         hba1c 6.2,     chol 154, tg 181, hdl 41, ldl-c 77  From 7/18 showed   gluc 129, cr 0.63, gfr>60, alt 26, hba1c 6.1,                 wbc 6.0, hb 12.4, plt 193, vit d 79  From 1/19 showed   gluc 132, cr 0.83, gfr>60,     hba1c 6.1  From 4/19 showed         hba1c 7.0, umar 12 ,   chol 124, tg 109, hdl 46, ldl-c 56,                    vit d 56.1  From 7/19 showed   gluc 106, cr 0.67, gfr>60, alt 25, hba1c 6.0,                 wbc 5.8, hb 13.0, plt 183  From 1/20 showed        hba1c 6.0, umar 17,    chol 130, tg 113, hdl 46, ldl-c 61  From 8/20 showed   gluc 103, cr 0.73, gfr>60, alt 27, hba1c 5.7,       chol 124, tg 150, hdl 44, ldl-c 50, wbc 5.0, hb13.0, plt 190  Form 2/21 showed         hba1c 5.9, umar na,             vit d 88.9  From 8/21 showed   gluc 109, cr 0.68, gfr>60, alt 35, hba1c 5.5,       chol 156, tg 142, hdl 51,  ldl-c 77, wbc 4.2, hb 9.9,   plt 152, vit d 34.8  From 3/22 showed   gluc 100, cr 0.68, gfr>60, alt 22, hba1c 5.7,       chol 129, tg 137, hdl 48, ldl-c 54  From 9/22 showed   gluc 110, cr 0.72, gfr>60, alt 24, hba1c 5.8,       chol 128, tg 129, hdl 44, ldl-c 58, wbc 5.6, hb 12.0, plt 184  From 3/23 showed   gluc 115, cr 0.65, gfr>60,     hba1c 5.7,               vit d 93.3  From 3/24 showed   gluc 120, cr 0.70, gfr>60,     hba1c 5.7  From 10/24 showed gluc 108, cr 0.78, gfr>60, alt 16, hba1c 5.9, umar 22,     chol 114, tg 135, hdl 46, ldl-c 41, wbc 8.4, hb 12.8, plt  208    Results for orders placed or performed during the hospital encounter of 02/01/24 (from the past 2160 hours)   TSH + Free T4 Panel   Result Value Ref Range    TSH, 3rd Generation 2.05 0.36 - 3.74 uIU/mL    T4 Free 1.0 0.7 - 1.5 NG/DL   HEMOGLOBIN Z6X W/O EAG   Result Value Ref Range    Hemoglobin A1C 5.7 (H) 4.2 - 5.6 %   Basic Metabolic Panel   Result Value Ref Range    Sodium 138 136 - 145 mmol/L    Potassium 4.0 3.5 - 5.5 mmol/L    Chloride 104 100 - 111 mmol/L    CO2 30 21 - 32 mmol/L    Anion Gap 4 3.0 - 18 mmol/L    Glucose 114 (H) 74 - 99 mg/dL    BUN 9 7.0 - 18 MG/DL    Creatinine 0.96 0.6 - 1.3 MG/DL    BUN/Creatinine Ratio 13 12 - 20      Est, Glom Filt Rate 89 >60 ml/min/1.9m2    Calcium  9.1 8.5 - 10.1 MG/DL     We reviewed the patient's labs from the last several visits to point out trends in the numbers        Patient Active Problem List   Diagnosis    Dyslipidemia    Anxiety    Vitamin D deficiency    History of breast cancer    Neuropathy due to chemotherapeutic drug    Overweight with body mass index (BMI) of 25 to 25.9 in adult    Arthritis, degenerative    Controlled type 2 diabetes mellitus, without long-term current use of insulin (HCC)    Familial tremor       Assessment and plan:  1.  DM.  Continue januvia  and controlled.  F/u ophth  2.  Hyperlipidemia. Continue lipitor   3.  Hypovitaminosis D. Supplementation per onco  4.  Anxiety.  Continue celexa   5.  Overweight.  Lifestyle and dietary measures.  Portion control reiterated.  6.  Endometrial ca.  Per Dr Rena Carnes  7.  Breast ca.  Per Dr Wray Heady and Dr Lamount Pimple  8.  Possible neuropathy from chemo.  Continue gaba  9.  Venous disease.  Elevate, declined diuretic  10.  Familiar tremor.  Declined meds or neuro consult and will observe      RTC 10/25    Above conditions discussed at length and patient vocalized understanding.  All questions answered to patient satisfaction     Diagnosis  Orders   1. Controlled type 2 diabetes mellitus without  complication, without long-term current use of insulin  HEMOGLOBIN A1C W/O EAG    Comprehensive Metabolic Panel    CBC with Auto Differential    Lipid Panel      2. Familial tremor        3. Dyslipidemia        4. History of breast cancer

## 2024-02-15 NOTE — Progress Notes (Signed)
 Laura Roman presents today for   Chief Complaint   Patient presents with    Medicare AWV       "Have you been to the ER, urgent care clinic since your last visit?  Hospitalized since your last visit?"    NO    "Have you seen or consulted any other health care providers outside of [facility] since your last visit?"    YES - When: approximately 1  weeks ago.  Where and Why: [facility] GYN[facility]cology.

## 2024-02-15 NOTE — ACP (Advance Care Planning) (Signed)
 Advance Care Planning     Advance Care Planning (ACP) Physician/NP/PA Conversation    Date of Conversation: 02/15/2024  Conducted with: Patient with Decision Making Capacity    Healthcare Decision Maker:      Primary Decision Maker: Laura Roman - Spouse - [phone]    Click here to complete Healthcare Decision Makers including selection of the Healthcare Decision Maker Relationship (ie "Primary")  Today we documented Decision Maker(s) consistent with Legal Next of Kin hierarchy.    Care Preferences:    Hospitalization:  "If your health worsens and it becomes clear that your chance of recovery is unlikely, what would be your preference regarding hospitalization?"  The patient would prefer hospitalization.    Ventilation:  "If you were unable to breath on your own and your chance of recovery was unlikely, what would be your preference about the use of a ventilator (breathing machine) if it was available to you?"  The patient would desire the use of a ventilator.    Resuscitation:  "In the event your heart stopped as a result of an underlying serious health condition, would you want attempts made to restart your heart, or would you prefer a natural death?"  Yes, attempt to resuscitate.    ventilation preferences, hospitalization preferences, and resuscitation preferences    Conversation Outcomes / Follow-Up Plan:  ACP in process - information provided, considering goals and options  Reviewed DNR/DNI and patient elects Full Code (Attempt Resuscitation)    Length of Voluntary ACP Conversation in minutes:  16 minutes    Laura Murri Della Fent, MD

## 2024-02-15 NOTE — Progress Notes (Signed)
 Medicare Annual Wellness Visit    Glenda Spelman is here for Medicare AWV    Assessment & Plan   Controlled type 2 diabetes mellitus without complication, without long-term current use of insulin  -     HEMOGLOBIN A1C W/O EAG; Future  -     Comprehensive Metabolic Panel; Future  -     CBC with Auto Differential; Future  -     Lipid Panel; Future  Familial tremor  Dyslipidemia  History of breast cancer  Medicare annual wellness visit, subsequent       Return in 6 months (on 08/16/2024).     Subjective       Patient's complete Health Risk Assessment and screening values have been reviewed and are found in Flowsheets. The following problems were reviewed today and where indicated follow up appointments were made and/or referrals ordered.    Positive Risk Factor Screenings with Interventions:              Inactivity:  On average, how many days per week do you engage in moderate to strenuous exercise (like a brisk walk)?: (Patient-Rptd) 0 days (!) Abnormal  On average, how many minutes do you engage in exercise at this level?: (Patient-Rptd) Patient declined  Interventions:  Patient declined any further interventions or treatment                 Pt is sexually active        Objective   Vitals:    02/15/24 0851   BP: 94/68   Pulse: 94   Resp: 16   Temp: 97.8 F (36.6 C)   TempSrc: Temporal   SpO2: 96%   Weight: 71.2 kg (157 lb)   Height: 1.626 m (5\' 4" )      Body mass index is 26.95 kg/m.                    Allergies   Allergen Reactions    Lidocaine Other (See Comments)     anxious    Metformin Diarrhea    Nabumetone Nausea Only    Thimerosal (Thiomersal) Hives     Prior to Visit Medications    Medication Sig Taking? Authorizing Provider   atorvastatin  (LIPITOR) 40 MG tablet take 1 tablet by mouth once daily Yes Lyndsee Casa S, MD   lansoprazole  (PREVACID ) 30 MG delayed release capsule take 1 capsule by mouth once daily before breakfast Yes Jamauri Kruzel S, MD   citalopram  (CELEXA ) 10 MG tablet take 1 tablet by  mouth once daily Yes Lorena Benham S, MD   gabapentin  (NEURONTIN ) 300 MG capsule Take 1 capsule by mouth daily. Yes [provider]   Probiotic CHEW Take by mouth Yes [provider]   ELDERBERRY PO Take by mouth Yes [provider]   JANUVIA  100 MG tablet take 1 tablet by mouth once daily Yes Cloee Dunwoody S, MD   Multiple Vitamins-Minerals (MULTIVITAMIN ADULTS 50+ PO) Take 1 tablet by mouth daily Yes [provider]   Cholecalciferol (VITAMIN D3) 1.25 MG (50000 UT) CAPS Take 1 capsule by mouth once a week Yes [provider]   Lancets MISC Use daily as directed Yes Automatic Reconciliation, Ar   aspirin 81 MG chewable tablet Take 1 tablet by mouth daily Yes Automatic Reconciliation, Ar       CareTeam (Including outside providers/suppliers regularly involved in providing care):   Patient Care Team:  Grover Woodfield S, MD as PCP - General  Yaffa Seckman  S, MD as PCP - Empaneled Provider     Recommendations for Preventive Services Due: see orders and patient instructions/AVS.  Recommended screening schedule for the next 5-10 years is provided to the patient in written form: see Patient Instructions/AVS.     Reviewed and updated this visit:  Tobacco  Allergies  Meds  Problems  Sexual Hx

## 2024-02-15 NOTE — Patient Instructions (Signed)
 Learning About Being Active as an Older Adult  Why is being active important as you get older?     Being active is one of the best things you can do for your health. And it's never too late to start. Being active--or getting active, if you aren't already--has definite benefits. It can:  Give you more energy,  Keep your mind sharp.  Improve balance to reduce your risk of falls.  Help you manage chronic illness with fewer medicines.  No matter how old you are, how fit you are, or what health problems you have, there is a form of activity that will work for you. And the more physical activity you can do, the better your overall health will be.  What kinds of activity can help you stay healthy?  Being more active will make your daily activities easier. Physical activity includes planned exercise and things you do in daily life. There are four types of activity:  Aerobic.  Doing aerobic activity makes your heart and lungs strong.  Includes walking, dancing, and gardening.  Aim for at least 2 hours spread throughout the week.  It improves your energy and can help you sleep better.  Muscle-strengthening.  This type of activity can help maintain muscle and strengthen bones.  Includes climbing stairs, using resistance bands, and lifting or carrying heavy loads.  Aim for at least twice a week.  It can help protect the knees and other joints.  Stretching.  Stretching gives you better range of motion in joints and muscles.  Includes upper arm stretches, calf stretches, and gentle yoga.  Aim for at least twice a week, preferably after your muscles are warmed up from other activities.  It can help you function better in daily life.  Balancing.  This helps you stay coordinated and have good posture.  Includes heel-to-toe walking, tai chi, and certain types of yoga.  Aim for at least 3 days a week.  It can reduce your risk of falling.  Even if you have a hard time meeting the recommendations, it's better to be more active  than less active. All activity done in each category counts toward your weekly total. You'd be surprised how daily things like carrying groceries, keeping up with grandchildren, and taking the stairs can add up.  What keeps you from being active?  If you've had a hard time being more active, you're not alone. Maybe you remember being able to do more. Or maybe you've never thought of yourself as being active. It's frustrating when you can't do the things you want. Being more active can help. What's holding you back?  Getting started.  Have a goal, but break it into easy tasks. Small steps build into big accomplishments.  Staying motivated.  If you feel like skipping your activity, remember your goal. Maybe you want to move better and stay independent. Every activity gets you one step closer.  Not feeling your best.  Start with 5 minutes of an activity you enjoy. Prove to yourself you can do it. As you get comfortable, increase your time.  You may not be where you want to be. But you're in the process of getting there. Everyone starts somewhere.  How can you find safe ways to stay active?  Talk with your doctor about any physical challenges you're facing. Make a plan with your doctor if you have a health problem or aren't sure how to get started with activity.  If you're already active, ask your doctor if  there is anything you should change to stay safe as your body and health change.  If you tend to feel dizzy after you take medicine, avoid activity at that time. Try being active before you take your medicine. This will reduce your risk of falls.  If you plan to be active at home, make sure to clear your space before you get started. Remove things like TV cords, coffee tables, and throw rugs. It's safest to have plenty of space to move freely.  The key to getting more active is to take it slow and steady. Try to improve only a little bit at a time. Pick just one area to improve on at first. And if an activity hurts,  stop and talk to your doctor.  Where can you learn more?  Go to [employer] and enter P600 to learn more about "Learning About Being Active as an Older Adult."  Current as of: May 26, 2023  Content Version: 14.4   2024-2025 [place], [place].   Care instructions adapted under license by [facility]. If you have questions about a medical condition or this instruction, always ask your healthcare professional. Laren Player, [facility], disclaims any warranty or liability for your use of this information.         A Healthy Heart: Care Instructions  Overview     Coronary artery disease, also called heart disease, occurs when a substance called plaque builds up in the vessels that supply oxygen-rich blood to your heart muscle. This can narrow the blood vessels and reduce blood flow. A heart attack happens when blood flow is completely blocked. A high-fat diet, smoking, and other factors increase the risk of heart disease.  Your doctor has found that you have a chance of having heart disease. A heart-healthy lifestyle can help keep your heart healthy and prevent heart disease. This lifestyle includes eating healthy, being active, staying at a weight that's healthy for you, and not smoking or using tobacco. It also includes taking medicines as directed, managing other health conditions, and trying to get a healthy amount of sleep.  Follow-up care is a key part of your treatment and safety. Be sure to make and go to all appointments, and call your doctor if you are having problems. It's also a good idea to know your test results and keep a list of the medicines you take.  How can you care for yourself at home?  Diet    Use less salt when you cook and eat. This helps lower your blood pressure. Taste food before salting. Add only a little salt when you think you need it. With time, your taste buds will adjust to less salt.     Eat fewer snack items, fast foods, canned soups, and other high-salt,  high-fat, processed foods.     Read food labels and try to avoid saturated and trans fats. They increase your risk of heart disease by raising cholesterol levels.     Limit the amount of solid fat--butter, margarine, and shortening--you eat. Use olive, peanut, or canola oil when you cook. Bake, broil, and steam foods instead of frying them.     Eat a variety of fruit and vegetables every day. Dark green, deep orange, red, or yellow fruits and vegetables are especially good for you. Examples include spinach, carrots, peaches, and berries.     Foods high in fiber can reduce your cholesterol and provide important vitamins and minerals. High-fiber foods include whole-grain cereals and breads, oatmeal, beans, brown  rice, citrus fruits, and apples.     Eat lean proteins. Heart-healthy proteins include seafood, lean meats and poultry, eggs, beans, peas, nuts, seeds, and soy products.     Limit drinks and foods with added sugar. These include candy, desserts, and soda pop.   Heart-healthy lifestyle    If your doctor recommends it, get more exercise. For many people, walking is a good choice. Or you may want to swim, bike, or do other activities. Bit by bit, increase the time you're active every day. Try for at least 30 minutes on most days of the week.     Try to quit or cut back on using tobacco and other nicotine products. This includes smoking and vaping. If you need help quitting, talk to your doctor about stop-smoking programs and medicines. These can increase your chances of quitting for good. Quitting is one of the most important things you can do to protect your heart. It is never too late to quit. Try to avoid secondhand smoke too.     Stay at a weight that's healthy for you. Talk to your doctor if you need help losing weight.     Try to get 7 to 9 hours of sleep each night.     Limit alcohol to 2 drinks a day for men and 1 drink a day for women. Too much alcohol can cause health problems.     Manage other health  problems such as diabetes, high blood pressure, and high cholesterol. If you think you may have a problem with alcohol or drug use, talk to your doctor.   Medicines    Take your medicines exactly as prescribed. Call your doctor if you think you are having a problem with your medicine.     If your doctor recommends aspirin, take the amount directed each day. Make sure you take aspirin and not another kind of pain reliever, such as acetaminophen (Tylenol).   When should you call for help?   Call 911 if you have symptoms of a heart attack. These may include:    Chest pain or pressure, or a strange feeling in the chest.     Sweating.     Shortness of breath.     Pain, pressure, or a strange feeling in the back, neck, jaw, or upper belly or in one or both shoulders or arms.     Lightheadedness or sudden weakness.     A fast or irregular heartbeat.   After you call 911, the operator may tell you to chew 1 adult-strength or 2 to 4 low-dose aspirin. Wait for an ambulance. Do not try to drive yourself.  Watch closely for changes in your health, and be sure to contact your doctor if you have any problems.  Where can you learn more?  Go to [employer] and enter F075 to learn more about "A Healthy Heart: Care Instructions."  Current as of: May 26, 2023  Content Version: 14.4   2024-2025 [place], [place].   Care instructions adapted under license by [facility]. If you have questions about a medical condition or this instruction, always ask your healthcare professional. Laren Player, [facility], disclaims any warranty or liability for your use of this information.    Personalized Preventive Plan for Laura Roman - 02/15/2024  Medicare offers a range of preventive health benefits. Some of the tests and screenings are paid in full while other may be subject to a deductible, co-insurance, and/or copay.  Some of these benefits include  a comprehensive review of your medical history including lifestyle,  illnesses that may run in your family, and various assessments and screenings as appropriate.  After reviewing your medical record and screening and assessments performed today your provider may have ordered immunizations, labs, imaging, and/or referrals for you.  A list of these orders (if applicable) as well as your Preventive Care list are included within your After Visit Summary for your review.

## 2024-02-17 DIAGNOSIS — M797 Fibromyalgia: Secondary | ICD-10-CM | POA: Diagnosis not present

## 2024-02-17 DIAGNOSIS — I2510 Atherosclerotic heart disease of native coronary artery without angina pectoris: Secondary | ICD-10-CM | POA: Diagnosis not present

## 2024-02-17 DIAGNOSIS — E119 Type 2 diabetes mellitus without complications: Secondary | ICD-10-CM | POA: Diagnosis not present

## 2024-02-17 DIAGNOSIS — I471 Supraventricular tachycardia, unspecified: Secondary | ICD-10-CM | POA: Diagnosis not present

## 2024-02-17 DIAGNOSIS — I5032 Chronic diastolic (congestive) heart failure: Secondary | ICD-10-CM | POA: Diagnosis not present

## 2024-02-17 DIAGNOSIS — I110 Hypertensive heart disease with heart failure: Secondary | ICD-10-CM | POA: Diagnosis not present

## 2024-02-17 NOTE — Telephone Encounter (Signed)
 Received notification from [facility] regarding a prior authorization for Arava . Authorization has been APPROVED from 02/16/2024 until further notice.    Authorization # [id number] Phone # [phone]

## 2024-02-18 ENCOUNTER — Ambulatory Visit (INDEPENDENT_AMBULATORY_CARE_PROVIDER_SITE_OTHER): Admitting: Family Medicine

## 2024-02-18 VITALS — BP 102/56 | HR 62 | Ht 66.0 in | Wt 162.0 lb

## 2024-02-18 DIAGNOSIS — E538 Deficiency of other specified B group vitamins: Secondary | ICD-10-CM

## 2024-02-18 MED ORDER — CYANOCOBALAMIN 1000 MCG/ML IJ SOLN
1000.0000 ug | Freq: Once | INTRAMUSCULAR | Status: AC
  Administered 2024-02-18: 1000 ug via INTRAMUSCULAR

## 2024-02-18 NOTE — Progress Notes (Signed)
 Pt came in today for B12 injection.   Injection given in [facility]OQ. Pt tolerated well.   Pt reports no negative side effects from medication.   Denies any dizziness, chest pain or palpitations, and no GI problems.  Pt to RTC in 30 days for next injection.

## 2024-02-21 ENCOUNTER — Other Ambulatory Visit: Payer: Self-pay | Admitting: Adult Health

## 2024-02-28 ENCOUNTER — Telehealth: Payer: Self-pay | Admitting: Rheumatology

## 2024-02-28 NOTE — Telephone Encounter (Signed)
 Patient left a voicemail stating her insurance only approved her Leflunomide  10 mg for 1 tablet a day.  Patient states she was told she needed to alternate 10 mg and 20 mg dose every other day.  Patient is [facility] 20 mg [facility] [place] so she can take the medication the way it is written.

## 2024-02-28 NOTE — Telephone Encounter (Signed)
 Spoke with patient and she states the pharmacy advised her the insurance PA only approved 1 10 mg tablet daily. Patient advised the PA was approved for alternating 1 tabs and 2 tablets every other day. Patient advised I would contact the pharmacy to figure out what is going on. [facility]ist. She ran the prescription and advised it did go through but a 90 day supply is $248 and a 30 day supply is $82.94. Looked up medication on goodrx and found coupon for Arava . The price for a 90 day supply with coupon is $47.65.  Spoke with patient and advised. Patient advised of information and text good rx coupon to her for the Arava . Advised patient to let the pharmacy know she wants to use the coupon and not her insurance. Patient expressed understanding.

## 2024-03-02 ENCOUNTER — Telehealth: Payer: Self-pay | Admitting: Family Medicine

## 2024-03-02 NOTE — Telephone Encounter (Signed)
 Please call patient let her know that we did get her download from her CPAP machine.  Usage was 28 out of 30 days so 87% which is great she also used it for greater than 4 hours 23 of those days is 77% which is great.  Just encouraged her to continue to work on trying to use it for at least 4 hours a day.  The BN pressure was around 9.1 with the 95th percentile being at 15.7.  The AHI is difficult to interpret on the paper.  So we may have to call them and get them to refax it.

## 2024-03-06 NOTE — Telephone Encounter (Signed)
 Compliance report being re-faxed to office.

## 2024-03-10 ENCOUNTER — Other Ambulatory Visit: Payer: Self-pay | Admitting: Cardiology

## 2024-03-10 ENCOUNTER — Ambulatory Visit: Payer: Self-pay

## 2024-03-10 DIAGNOSIS — M549 Dorsalgia, unspecified: Secondary | ICD-10-CM | POA: Diagnosis not present

## 2024-03-10 DIAGNOSIS — I10 Essential (primary) hypertension: Secondary | ICD-10-CM | POA: Diagnosis not present

## 2024-03-10 DIAGNOSIS — R0609 Other forms of dyspnea: Secondary | ICD-10-CM | POA: Diagnosis not present

## 2024-03-10 DIAGNOSIS — R600 Localized edema: Secondary | ICD-10-CM | POA: Diagnosis not present

## 2024-03-10 DIAGNOSIS — R0600 Dyspnea, unspecified: Secondary | ICD-10-CM | POA: Diagnosis not present

## 2024-03-10 DIAGNOSIS — R079 Chest pain, unspecified: Secondary | ICD-10-CM | POA: Diagnosis not present

## 2024-03-10 NOTE — Telephone Encounter (Signed)
 Patient called back in from dropped call, patient no longer on line after transfer. Attempted to reach back out to patient, left voice mail for her to return call.

## 2024-03-10 NOTE — Telephone Encounter (Signed)
 Chief Complaint: HTN Symptoms: HTN, back pain, SOB on exertion, visual changes Pertinent Negatives: Patient denies CP, palpitations, diaphoresis, vomiting, one-sided weakness or numbness, dizziness, H/A Disposition: [x] ED /[] Urgent Care (no appt availability in office) / [] Appointment(In office/virtual)/ []  [place] [facility]/ [] Home Care/ [] Refused Recommended Disposition /[] [place] [facility]/ []  Follow-up with PCP Additional Notes: Pt reports recent elevated blood pressure in the setting of chronic back pain d/t bulging discs. Pt states she was supposed to have surgery last year but it got pushed back because she was diagnosed with a DVT and norovirus. 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 Pt reports her BP yesterday was 178/86 and today it is 159/80. Pt reports some SOB on exertion with stairs and walking and states she used her inhaler which helped. Denies SOB at rest. 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 Pt endorses some weakness d/t back pain but none that is new or worse. Denies one-sided weakness, CP, palpitations, nausea/vomiting, dizziness, H/A. Pt reports for 2-3 wks she stopped taking amlodipine  because her BP was well-controlled. Pt resumed taking it yesterday and also takes valsartan  and metoprolol . Pt asks for pain medication to be sent to the pharmacy for her to assist with her pain while she is in [place] . Pt states pain patches and ibuprofen  take the edge off but it is still painful. 7/10 pain with sitting.   Given elevated BP, SOB on exertion, visual changes and pt's history of DVT RN advised pt she should go to the ED. Pt states she will discuss it with her daughter. RN advised pt if she develops SOB at rest or CP she should call 911. Pt verbalized understanding.  RN advised pt RN would send a note to the office that pt is requesting pain meds while in [place]  for her  back pain.    Copied from CRM [id number]. Topic: Clinical - Red Word Triage >> Mar 10, 2024  3:03 PM Lenon Radar A wrote: Red Word that prompted transfer to Nurse Triage: High BP, patient traveling in [place]  and needs pain medication. Last checked it was 178/86 last night, this morning 159/80+      Reason for Disposition  [1] Systolic BP  >= 160 OR Diastolic >= 100 AND [2] cardiac (e.g., breathing difficulty, chest pain) or neurologic symptoms (e.g., new-onset blurred or double vision, unsteady gait)  Additional Information  Commented on: [1] Systolic BP  >= 200 OR Diastolic >= 120 AND [2] having NO cardiac or neurologic symptoms    159/80 with SOB only on exertion and visual changes  Answer Assessment - Initial Assessment Questions 1. BLOOD PRESSURE: "What is the blood pressure?" "Did you take at least two measurements 5 minutes apart?"     159/80, 178/86 last night  2. ONSET: "When did you take your blood pressure?"     Last night and today 3. HOW: "How did you take your blood pressure?" (e.g., automatic home BP monitor, visiting nurse)     Automatic cuff  4. HISTORY: "Do you have a history of high blood pressure?"     HTN  5. MEDICINES: "Are you taking any medicines for blood pressure?" "Have you missed any doses recently?"     Amlodipine , valsartan , metoprolol ; did not take amlodipine  for 2-3 wks but restarted it yesterday  6. OTHER SYMPTOMS: "Do you have any symptoms?" (e.g., blurred vision, chest pain, difficulty breathing, headache, weakness)  Recent SOB with stairs and activity, improved with inhaler. No SOB at rest. No CP. No dizziness. No nausea, vomiting. 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 Denies palpitations. "Weakness is hard to tell because with my ruptured disc and  overall muscle weakness", nothing new. Denies headaches. Denies one-sided weakness. Denies slurred speech and difficulty finding her words.  Back pain d/t bulging discs. Will have surgery end of June. Surgery was delayed due to a blood clot and norovirus. Thinks pain is increasing BP. Using pain patches, heat patches, ibuprofen . 7/10 pain while sitting. Worse with walking or standing - 15/10. Pt states OTC medication and patches take the edge off. Pt is able to walk.  Protocols used: Blood Pressure - High-A-AH

## 2024-03-10 NOTE — Telephone Encounter (Signed)
 Copied from CRM [id number]. Topic: Clinical - Red Word Triage >> Mar 10, 2024  3:03 PM Lenon Radar A wrote: Red Word that prompted transfer to Nurse Triage: High BP, patient traveling in [place]  and needs pain medication. Last checked it was 178/86 last night, this morning 159/80+

## 2024-03-23 ENCOUNTER — Ambulatory Visit: Payer: Medicare Other | Admitting: Family Medicine

## 2024-03-27 ENCOUNTER — Telehealth: Payer: Self-pay | Admitting: Rheumatology

## 2024-03-27 NOTE — Telephone Encounter (Signed)
 Patient contacted the office to request a medication refill.   1. Name of Medication: Arava   2. How are you currently taking this medication (dosage and times per day)? One pill one day and 2 pills the next    3. What pharmacy would you like for that to be [facility] [place]    Pt stated there was a 3 month refill approved for this medication.

## 2024-03-27 NOTE — Telephone Encounter (Signed)
 Contacted patient about her Arava , she states she is currently in [place] . She asked if we could send her in a prescription for 135 tables (3 months) advised that we did send it for that amount in April. Patient stated the pharmacy only gave her 45 pills, but she would like to get the whole 3 month supply at one time because it is cheaper that way. Patient is going to contact her pharmacy.

## 2024-03-31 DIAGNOSIS — M5416 Radiculopathy, lumbar region: Secondary | ICD-10-CM | POA: Diagnosis not present

## 2024-03-31 DIAGNOSIS — M25561 Pain in right knee: Secondary | ICD-10-CM | POA: Diagnosis not present

## 2024-04-05 ENCOUNTER — Telehealth: Payer: Self-pay | Admitting: Cardiovascular Disease

## 2024-04-05 NOTE — Telephone Encounter (Signed)
 I returned pt's call and left message that we have not received a clearance request in her behalf. If she is having surgery, the surgeon office will need to fax over a request to fax# [phone] attn: preop team.

## 2024-04-05 NOTE — Telephone Encounter (Signed)
 Hello this patient need to speak with you or someone about her Pre- op clearance for a surgery coming up. I told her this is all I can do to help. The number in the chart is correct, so please reach out ASAP! Thank you and have a good day!

## 2024-04-06 ENCOUNTER — Other Ambulatory Visit: Payer: Self-pay | Admitting: Orthopedic Surgery

## 2024-04-10 ENCOUNTER — Encounter: Payer: Self-pay | Admitting: Family Medicine

## 2024-04-11 NOTE — Progress Notes (Signed)
 Surgical Instructions   Your procedure is scheduled on April 20, 2024. Report to [facility] Main Entrance A at 5:30 A.M., then check in with the Admitting office. Any questions or running late day of surgery: call [phone]  Questions prior to your surgery date: call [phone], Monday-Friday, 8am-4pm. If you experience any cold or flu symptoms such as cough, fever, chills, shortness of breath, etc. between now and your scheduled surgery, please notify us  at the above number.     Remember:  Do not eat after midnight the night before your surgery  You may drink clear liquids until 4:30 the morning of your surgery.   Clear liquids allowed are: Water, Non-Citrus Juices (without pulp), Carbonated Beverages, Clear Tea (no milk, honey, etc.), Black Coffee Only (NO MILK, CREAM OR POWDERED CREAMER of any kind), and Gatorade. Patient Instructions  The night before surgery:  No food after midnight. ONLY clear liquids after midnight  The day of surgery (if you do NOT have diabetes):  Drink ONE (1) Pre-Surgery Clear Ensure by 4:30 the morning of surgery. Drink in one sitting. Do not sip.  This drink was given to you during your hospital  pre-op appointment visit.  Nothing else to drink after completing the  Pre-Surgery Clear Ensure.  questions, please contact your surgeon's office.    Take these medicines the morning of surgery with A SIP OF WATER amLODipine  (NORVASC )  gabapentin  (NEURONTIN )  leflunomide  (ARAVA )  memantine  (NAMENDA )  metoprolol  tartrate (LOPRESSOR )  pantoprazole  (PROTONIX )  rosuvastatin  (CRESTOR )  sucralfate  (CARAFATE )  venlafaxine  XR (EFFEXOR -XR)   May take these medicines IF NEEDED: acetaminophen  (TYLENOL )  albuterol   inhaler  ipratropium nasal spray  meclizine  (ANTIVERT )    One week prior to surgery, STOP taking any Aspirin (unless otherwise instructed by your surgeon) Aleve, Naproxen, Ibuprofen , Motrin , Advil , Goody's, BC's, all herbal medications, fish  oil, and non-prescription vitamins.                     Do NOT Smoke (Tobacco/Vaping) for 24 hours prior to your procedure.  If you use a CPAP at night, you may bring your mask/headgear for your overnight stay.   You will be asked to remove any contacts, glasses, piercing's, hearing aid's, dentures/partials prior to surgery. Please bring cases for these items if needed.    Patients discharged the day of surgery will not be allowed to drive home, and someone needs to stay with them for 24 hours.  SURGICAL WAITING ROOM VISITATION Patients may have no more than 2 support people in the waiting area - these visitors may rotate.   Pre-op nurse will coordinate an appropriate time for 1 ADULT support person, who may not rotate, to accompany patient in pre-op.  Children under the age of 54 must have an adult with them who is not the patient and must remain in the main waiting area with an adult.  If the patient needs to stay at the hospital during part of their recovery, the visitor guidelines for inpatient rooms apply.  Please refer to the [facility] website for the visitor guidelines for any additional information.   If you received a COVID test during your pre-op visit  it is requested that you wear a mask when out in public, stay away from anyone that may not be feeling well and notify your surgeon if you develop symptoms. If you have been in contact with anyone that has tested positive in the last 10 days please notify you surgeon.  Pre-operative 5 CHG Bathing Instructions   You can play a key role in reducing the risk of infection after surgery. Your skin needs to be as free of germs as possible. You can reduce the number of germs on your skin by washing with CHG (chlorhexidine gluconate) soap before surgery. CHG is an antiseptic soap that kills germs and continues to kill germs even after washing.   DO NOT use if you have an allergy to chlorhexidine/CHG or antibacterial soaps. If  your skin becomes reddened or irritated, stop using the CHG and notify one of our RNs at [phone].   Please shower with the CHG soap starting 4 days before surgery using the following schedule:     Please keep in mind the following:  DO NOT shave, including legs and underarms, starting the day of your first shower.   You may shave your face at any point before/day of surgery.  Place clean sheets on your bed the day you start using CHG soap. Use a clean washcloth (not used since being washed) for each shower. DO NOT sleep with pets once you start using the CHG.   CHG Shower Instructions:  Wash your face and private area with normal soap. If you choose to wash your hair, wash first with your normal shampoo.  After you use shampoo/soap, rinse your hair and body thoroughly to remove shampoo/soap residue.  Turn the water OFF and apply about 3 tablespoons (45 ml) of CHG soap to a CLEAN washcloth.  Apply CHG soap ONLY FROM YOUR NECK DOWN TO YOUR TOES (washing for 3-5 minutes)  DO NOT use CHG soap on face, private areas, open wounds, or sores.  Pay special attention to the area where your surgery is being performed.  If you are having back surgery, having someone wash your back for you may be helpful. Wait 2 minutes after CHG soap is applied, then you may rinse off the CHG soap.  Pat dry with a clean towel  Put on clean clothes/pajamas   If you choose to wear lotion, please use ONLY the CHG-compatible lotions that are listed below.  Additional instructions for the day of surgery: DO NOT APPLY any lotions, deodorants, cologne, or perfumes.   Do not bring valuables to the hospital. [facility] is not responsible for any belongings/valuables. Do not wear nail polish, gel polish, artificial nails, or any other type of covering on natural nails (fingers and toes) Do not wear jewelry or makeup Put on clean/comfortable clothes.  Please brush your teeth.  Ask your nurse before applying any  prescription medications to the skin.     CHG Compatible Lotions   Aveeno Moisturizing lotion  Cetaphil Moisturizing Cream  Cetaphil Moisturizing Lotion  Clairol Herbal Essence Moisturizing Lotion, Dry Skin  Clairol Herbal Essence Moisturizing Lotion, Extra Dry Skin  Clairol Herbal Essence Moisturizing Lotion, Normal Skin  Curel Age Defying Therapeutic Moisturizing Lotion with Alpha Hydroxy  Curel Extreme Care Body Lotion  Curel Soothing Hands Moisturizing Hand Lotion  Curel Therapeutic Moisturizing Cream, Fragrance-Free  Curel Therapeutic Moisturizing Lotion, Fragrance-Free  Curel Therapeutic Moisturizing Lotion, Original Formula  Eucerin Daily Replenishing Lotion  Eucerin Dry Skin Therapy Plus Alpha Hydroxy Crme  Eucerin Dry Skin Therapy Plus Alpha Hydroxy Lotion  Eucerin Original Crme  Eucerin Original Lotion  Eucerin Plus Crme Eucerin Plus Lotion  Eucerin TriLipid Replenishing Lotion  Keri Anti-Bacterial Hand Lotion  Keri Deep Conditioning Original Lotion Dry Skin Formula Softly Scented  Keri Deep Conditioning Original Lotion, Fragrance Free Sensitive  Skin Formula  Keri Lotion Fast Absorbing Fragrance Free Sensitive Skin Formula  Keri Lotion Fast Absorbing Softly Scented Dry Skin Formula  Keri Original Lotion  Keri Skin Renewal Lotion Keri Silky Smooth Lotion  Keri Silky Smooth Sensitive Skin Lotion  Nivea Body Creamy Conditioning Oil  Nivea Body Extra Enriched Lotion  Nivea Body Original Lotion  Nivea Body Sheer Moisturizing Lotion Nivea Crme  Nivea Skin Firming Lotion  NutraDerm 30 Skin Lotion  NutraDerm Skin Lotion  NutraDerm Therapeutic Skin Cream  NutraDerm Therapeutic Skin Lotion  ProShield Protective Hand Cream  Provon moisturizing lotion  Please read over the following fact sheets that you were given.

## 2024-04-12 ENCOUNTER — Other Ambulatory Visit: Payer: Self-pay

## 2024-04-12 ENCOUNTER — Encounter (HOSPITAL_COMMUNITY)
Admission: RE | Admit: 2024-04-12 | Discharge: 2024-04-12 | Disposition: A | Discharge status: Home / Self Care | Source: Ambulatory Visit | Attending: Orthopedic Surgery | Admitting: Orthopedic Surgery

## 2024-04-12 ENCOUNTER — Encounter (HOSPITAL_COMMUNITY): Payer: Self-pay

## 2024-04-12 VITALS — BP 141/69 | HR 57 | Temp 98.2°F | Resp 17 | Ht 66.0 in | Wt 165.0 lb

## 2024-04-12 DIAGNOSIS — R251 Tremor, unspecified: Secondary | ICD-10-CM | POA: Diagnosis not present

## 2024-04-12 DIAGNOSIS — M069 Rheumatoid arthritis, unspecified: Secondary | ICD-10-CM | POA: Diagnosis not present

## 2024-04-12 DIAGNOSIS — Z79899 Other long term (current) drug therapy: Secondary | ICD-10-CM | POA: Diagnosis not present

## 2024-04-12 DIAGNOSIS — K219 Gastro-esophageal reflux disease without esophagitis: Secondary | ICD-10-CM | POA: Diagnosis not present

## 2024-04-12 DIAGNOSIS — Z01812 Encounter for preprocedural laboratory examination: Secondary | ICD-10-CM | POA: Insufficient documentation

## 2024-04-12 DIAGNOSIS — G4733 Obstructive sleep apnea (adult) (pediatric): Secondary | ICD-10-CM | POA: Diagnosis not present

## 2024-04-12 DIAGNOSIS — I5032 Chronic diastolic (congestive) heart failure: Secondary | ICD-10-CM | POA: Insufficient documentation

## 2024-04-12 DIAGNOSIS — E039 Hypothyroidism, unspecified: Secondary | ICD-10-CM | POA: Diagnosis not present

## 2024-04-12 DIAGNOSIS — Z01818 Encounter for other preprocedural examination: Secondary | ICD-10-CM

## 2024-04-12 DIAGNOSIS — Z7969 Long term (current) use of other immunomodulators and immunosuppressants: Secondary | ICD-10-CM | POA: Diagnosis not present

## 2024-04-12 DIAGNOSIS — Z86718 Personal history of other venous thrombosis and embolism: Secondary | ICD-10-CM | POA: Diagnosis not present

## 2024-04-12 DIAGNOSIS — H8190 Unspecified disorder of vestibular function, unspecified ear: Secondary | ICD-10-CM | POA: Diagnosis not present

## 2024-04-12 DIAGNOSIS — G3184 Mild cognitive impairment, so stated: Secondary | ICD-10-CM | POA: Insufficient documentation

## 2024-04-12 DIAGNOSIS — I7300 Raynaud's syndrome without gangrene: Secondary | ICD-10-CM | POA: Insufficient documentation

## 2024-04-12 LAB — TYPE AND SCREEN
ABO/RH(D): O POS
Antibody Screen: NEGATIVE

## 2024-04-12 LAB — SURGICAL PCR SCREEN
MRSA, PCR: NEGATIVE
Staphylococcus aureus: NEGATIVE

## 2024-04-12 LAB — BASIC METABOLIC PANEL WITH GFR
Anion gap: 8 (ref 5–15)
BUN: 5 mg/dL — ABNORMAL LOW (ref 8–23)
CO2: 29 mmol/L (ref 22–32)
Calcium: 9.8 mg/dL (ref 8.9–10.3)
Chloride: 101 mmol/L (ref 98–111)
Creatinine, Ser: 0.7 mg/dL (ref 0.44–1.00)
GFR, Estimated: >60 mL/min (ref >60)
Glucose, Bld: 118 mg/dL — ABNORMAL HIGH (ref 70–99)
Potassium: 4.6 mmol/L (ref 3.5–5.1)
Sodium: 138 mmol/L (ref 135–145)

## 2024-04-12 LAB — CBC
HCT: 42.4 % (ref 36.0–46.0)
Hemoglobin: 13.4 g/dL (ref 12.0–15.0)
MCH: 31.7 pg (ref 26.0–34.0)
MCHC: 31.6 g/dL (ref 30.0–36.0)
MCV: 100.2 fL — ABNORMAL HIGH (ref 80.0–100.0)
Platelets: 250 10*3/uL (ref 150–400)
RBC: 4.23 MIL/uL (ref 3.87–5.11)
RDW: 13.7 % (ref 11.5–15.5)
WBC: 5.7 10*3/uL (ref 4.0–10.5)
nRBC: 0 % (ref 0.0–0.2)

## 2024-04-12 LAB — GLUCOSE, CAPILLARY: Glucose-Capillary: 109 mg/dL — ABNORMAL HIGH (ref 70–99)

## 2024-04-12 NOTE — Progress Notes (Addendum)
 PCP - Dr. Duaine German Cardiologist - Dr. Ahmad Alert, last OV, 11/10/2023  Saw PA, Lovette Rud 01/17/2024.  No clearance received Neurologist: Andrew Banister, Kirby Peoples, [facility]  PPM/ICD - denies Device Orders - na Rep Notified - na  Chest x-ray - 05/31/2023 EKG - 05/31/2023 Stress Test - 06/04/2020 ECHO - 03/04/2020 Cardiac Cath - 02/11/2005  Sleep Study - OSA with nightly CPAP  Non-diabetic  Last dose of GLP1 agonist-  denies GLP1 instructions: na  Blood Thinner Instructions:denies. Cardiology note 01/17/2024 said to continue Eliquis , patient states she stopped it 09/2023.  Aspirin Instructions:denies  ERAS Protcol -Ensure until 0430  Anesthesia review: Yes. SVT, OSA with CPAP, HTN, Hx DVT  Patient denies shortness of breath, fever, cough and chest pain at PAT appointment   All instructions explained to the patient, with a verbal understanding of the material. Patient agrees to go over the instructions while at home for a better understanding. Patient also instructed to self quarantine after being tested for COVID-19. The opportunity to ask questions was provided.

## 2024-04-13 DIAGNOSIS — R7989 Other specified abnormal findings of blood chemistry: Secondary | ICD-10-CM | POA: Diagnosis not present

## 2024-04-13 DIAGNOSIS — K580 Irritable bowel syndrome with diarrhea: Secondary | ICD-10-CM | POA: Diagnosis not present

## 2024-04-13 DIAGNOSIS — I10 Essential (primary) hypertension: Secondary | ICD-10-CM | POA: Diagnosis not present

## 2024-04-13 DIAGNOSIS — K219 Gastro-esophageal reflux disease without esophagitis: Secondary | ICD-10-CM | POA: Diagnosis not present

## 2024-04-14 ENCOUNTER — Telehealth: Payer: Self-pay

## 2024-04-14 ENCOUNTER — Telehealth: Payer: Self-pay | Admitting: *Deleted

## 2024-04-14 DIAGNOSIS — M25562 Pain in left knee: Secondary | ICD-10-CM | POA: Diagnosis not present

## 2024-04-14 DIAGNOSIS — G8929 Other chronic pain: Secondary | ICD-10-CM | POA: Diagnosis not present

## 2024-04-14 NOTE — Telephone Encounter (Signed)
 I s/w the pt and she has been Monday 04/17/24 tele preop appt per Lawana Pray, FNP. Med rec and consent are done.

## 2024-04-14 NOTE — Telephone Encounter (Signed)
 I s/w the pt and she has been Monday 04/17/24 tele preop appt per Lawana Pray, FNP. Med rec and consent are done.     Patient Consent for Virtual Visit        Jarrod Bodkins Rewis has provided verbal consent on 04/14/2024 for a virtual visit (video or telephone).   CONSENT FOR VIRTUAL VISIT FOR:  Mia [name]  By participating in this virtual visit I agree to the following:  I hereby voluntarily request, consent and authorize [facility] and its employed or contracted physicians, physician assistants, nurse practitioners or other licensed health care professionals (the Practitioner), to provide me with telemedicine health care services (the "Services) as deemed necessary by the treating Practitioner. I acknowledge and consent to receive the Services by the Practitioner via telemedicine. I understand that the telemedicine visit will involve communicating with the Practitioner through live audiovisual communication technology and the disclosure of certain medical information by electronic transmission. I acknowledge that I have been given the opportunity to request an in-person assessment or other available alternative prior to the telemedicine visit and am voluntarily participating in the telemedicine visit.  I understand that I have the right to withhold or withdraw my consent to the use of telemedicine in the course of my care at any time, without affecting my right to future care or treatment, and that the Practitioner or I may terminate the telemedicine visit at any time. I understand that I have the right to inspect all information obtained and/or recorded in the course of the telemedicine visit and may receive copies of available information for a reasonable fee.  I understand that some of the potential risks of receiving the Services via telemedicine include:  Delay or interruption in medical evaluation due to technological equipment failure or disruption; Information transmitted may not  be sufficient (e.g. poor resolution of images) to allow for appropriate medical decision making by the Practitioner; and/or  In rare instances, security protocols could fail, causing a breach of personal health information.  Furthermore, I acknowledge that it is my responsibility to provide information about my medical history, conditions and care that is complete and accurate to the best of my ability. I acknowledge that Practitioner's advice, recommendations, and/or decision may be based on factors not within their control, such as incomplete or inaccurate data provided by me or distortions of diagnostic images or specimens that may result from electronic transmissions. I understand that the practice of medicine is not an exact science and that Practitioner makes no warranties or guarantees regarding treatment outcomes. I acknowledge that a copy of this consent can be made available to me via my patient portal [facility] MyChart), or I can request a printed copy by calling the office of [facility].    I understand that my insurance will be billed for this visit.   I have read or had this consent read to me. I understand the contents of this consent, which adequately explains the benefits and risks of the Services being provided via telemedicine.  I have been provided ample opportunity to ask questions regarding this consent and the Services and have had my questions answered to my satisfaction. I give my informed consent for the services to be provided through the use of telemedicine in my medical care

## 2024-04-14 NOTE — Telephone Encounter (Signed)
   Pre-operative Risk Assessment    Patient Name: Mia [name]  DOB: [birth year] MRN: [id number]   Date of last office visit: 01/17/24 RESSA CONTE, PA-C Date of next office visit: NONE   Request for Surgical Clearance    Procedure:  LEFT-SIDED LUMBAR 4 - LUMBAR 5 TRANSFORAMINAL LUMBAR INTERBODY FUSION AND DECOMPRESSION WITH INSTRUMENTATION AND ALLOGRAFT   Date of Surgery:  Clearance 04/20/24      (URGENT)                           Surgeon:  Virl Grimes, MD Surgeon's Group or Practice Name:  [facility] Phone number:  [phone] Fax number:  [phone]  ATTN: DONNA MCCAIN   Type of Clearance Requested:   - Medical    Type of Anesthesia:  Not Indicated   Additional requests/questions:    SignedCollin Deal   04/14/2024, 11:40 AM

## 2024-04-14 NOTE — Telephone Encounter (Signed)
   Name: Mia [name]  DOB: 14-Dec-1950  MRN: [id number]  Primary Cardiologist: Ahmad Alert, MD   Preoperative team, please contact this patient and set up a phone call appointment for further preoperative risk assessment. Please obtain consent and complete medication review. Thank you for your help. Last seen by Lovette Rud on 01/17/2024  I confirm that guidance regarding antiplatelet and oral anticoagulation therapy has been completed and, if necessary, noted below. Not on antiplatelet or anticoagulation.  I also confirmed the patient resides in the state of [place] . As per [facility] Medical Board telemedicine laws, the patient must reside in the state in which the provider is licensed.   Friddie Jetty, NP 04/14/2024, 11:45 AM  [facility]

## 2024-04-17 ENCOUNTER — Ambulatory Visit (INDEPENDENT_AMBULATORY_CARE_PROVIDER_SITE_OTHER): Admitting: Family Medicine

## 2024-04-17 ENCOUNTER — Ambulatory Visit: Attending: Internal Medicine

## 2024-04-17 VITALS — BP 116/62 | HR 64 | Ht 66.0 in | Wt 163.0 lb

## 2024-04-17 DIAGNOSIS — I10 Essential (primary) hypertension: Secondary | ICD-10-CM

## 2024-04-17 DIAGNOSIS — E538 Deficiency of other specified B group vitamins: Secondary | ICD-10-CM

## 2024-04-17 DIAGNOSIS — Z86718 Personal history of other venous thrombosis and embolism: Secondary | ICD-10-CM

## 2024-04-17 DIAGNOSIS — I1A0 Resistant hypertension: Secondary | ICD-10-CM

## 2024-04-17 DIAGNOSIS — R7301 Impaired fasting glucose: Secondary | ICD-10-CM

## 2024-04-17 DIAGNOSIS — Z01810 Encounter for preprocedural cardiovascular examination: Secondary | ICD-10-CM | POA: Diagnosis not present

## 2024-04-17 DIAGNOSIS — G4733 Obstructive sleep apnea (adult) (pediatric): Secondary | ICD-10-CM

## 2024-04-17 DIAGNOSIS — M797 Fibromyalgia: Secondary | ICD-10-CM

## 2024-04-17 LAB — POCT GLYCOSYLATED HEMOGLOBIN (HGB A1C): Hemoglobin A1C: 5.2 % (ref 4.0–5.6)

## 2024-04-17 MED ORDER — AMLODIPINE BESYLATE 5 MG PO TABS
7.5000 mg | ORAL_TABLET | Freq: Every day | ORAL | 3 refills | Status: DC
Start: 2024-04-17 — End: 2024-07-25

## 2024-04-17 MED ORDER — CYANOCOBALAMIN 1000 MCG/ML IJ SOLN
1000.0000 ug | Freq: Once | INTRAMUSCULAR | Status: AC
  Administered 2024-04-17: 1000 ug via INTRAMUSCULAR

## 2024-04-17 NOTE — Assessment & Plan Note (Signed)
 A1c looks fantastic today at 5.2.  Continue current regimen.

## 2024-04-17 NOTE — Progress Notes (Signed)
 Pt came in today for B12 injection. Injection given in RUOQ pt tolerated well.  Pt reports no negative side effects from medication. Denies any dizziness, chest pain or palpitations, and no GI problems.Mia [name]

## 2024-04-17 NOTE — Assessment & Plan Note (Signed)
 Blood pressure on target today.  Continue current regimen.

## 2024-04-17 NOTE — Assessment & Plan Note (Signed)
 I did encourage her to speak with a cardiologist about her prior history of DVT they might want to consider some short-term prophylaxis right after surgery.

## 2024-04-17 NOTE — Progress Notes (Signed)
 Anesthesia Chart Review:  73 year old female follows with cardiology for history of paroxysmal SVT, chronic diastolic CHF.  Per preop eval by Josefa Beauvais, NP on 04/17/2024, Chart reviewed as part of pre-operative protocol coverage. Given past medical history and time since last visit, based on ACC/AHA guidelines, Heleena Miceli Teale would be at acceptable risk for the planned procedure without further cardiovascular testing. Her RCRI is low risk, 0.9% risk of major cardiac event.  She is able to complete greater than 4 METS of physical activity. Patient was advised that if she develops new symptoms prior to surgery to contact our office to arrange a follow-up appointment.  He verbalized understanding.  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  Preop labs reviewed, unremarkable.  EKG 05/31/2023: NSR.  Rate 68.  Exercise tolerance test 06/04/2020: Blood pressure demonstrated a normal response to exercise. There was no ST segment deviation noted during stress.   ETT with poor exercise tolerance (2:31); no chest pain; normal blood pressure response; no diagnostic ST changes; no exercise-induced arrhythmias; study terminated secondary to leg pain and dyspnea; negative inadequate exercise tolerance test (patient only achieved 79% target heart rate).  TTE 03/04/2020:  1. Left ventricular ejection fraction, by estimation, is 60 to 65%. The  left ventricle has normal function. The left ventricle has no regional  wall motion abnormalities. There is mild left ventricular hypertrophy.  Left ventricular diastolic parameters  were normal.   2. Right ventricular systolic function is normal. The right ventricular  size is normal. There is normal pulmonary artery systolic pressure.   3. The mitral valve is normal in structure. Mild mitral valve   regurgitation. No evidence of mitral stenosis.   4. The aortic valve is normal in structure. Aortic valve regurgitation is  not visualized. No aortic stenosis is present.   5. The inferior vena cava is normal in size with greater than 50%  respiratory variability, suggesting right atrial pressure of 3 mmHg.     Lynwood Geofm RIGGERS [facility] Stay [facility]/Anesthesiology Phone [phone] 04/17/2024 3:49 PM

## 2024-04-17 NOTE — Anesthesia Preprocedure Evaluation (Signed)
 Anesthesia Evaluation  Patient identified by MRN, date of birth, ID band Patient awake    Reviewed: Allergy & Precautions, H&P , NPO status , Patient's Chart, lab work & pertinent test results  History of Anesthesia Complications (+) PONV and history of anesthetic complications  Airway Mallampati: I  TM Distance: >3 FB Neck ROM: Full    Dental no notable dental hx.    Pulmonary shortness of breath, sleep apnea    Pulmonary exam normal breath sounds clear to auscultation       Cardiovascular hypertension, + CAD, + Peripheral Vascular Disease, +CHF and + DOE  Normal cardiovascular exam+ dysrhythmias  Rhythm:Regular Rate:Normal     Neuro/Psych  Neuromuscular disease    GI/Hepatic ,GERD  ,,  Endo/Other  diabetes    Renal/GU      Musculoskeletal  (+) Arthritis , Osteoarthritis,  Fibromyalgia -  Abdominal   Peds  Hematology   Anesthesia Other Findings   Reproductive/Obstetrics                             Anesthesia Physical Anesthesia Plan  ASA: III  Anesthesia Plan: General   Post-op Pain Management:    Induction: Intravenous  PONV Risk Score and Plan: 4 or greater and Ondansetron , Dexamethasone , Midazolam , Droperidol and Treatment may vary due to age or medical condition  Airway Management Planned: Oral ETT  Additional Equipment:   Intra-op Plan:   Post-operative Plan: Extubation in OR  Informed Consent: I have reviewed the patients History and Physical, chart, labs and discussed the procedure including the risks, benefits and alternatives for the proposed anesthesia with the patient or authorized representative who has indicated his/her understanding and acceptance.       Plan Discussed with: CRNA and Surgeon  Anesthesia Plan Comments: (PAT note by Mia Hope, PA-C: 73 year old female follows with cardiology for history of paroxysmal SVT, chronic diastolic CHF.  Per  preop eval by Mia Beauvais, NP on 04/17/2024, Chart reviewed as part of pre-operative protocol coverage. Given past medical history and time since last visit, based on ACC/AHA guidelines, Mia [name] would be at acceptable risk for the planned procedure without further cardiovascular testing. Her RCRI is low risk, 0.9% risk of major cardiac event.  She is able to complete greater than 4 METS of physical activity. Patient was advised that if she develops new symptoms prior to surgery to contact our office to arrange a follow-up appointment.  He verbalized understanding.  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  Preop labs reviewed, unremarkable.  EKG 05/31/2023: NSR.  Rate 68.  Exercise tolerance test 06/04/2020:  Blood pressure demonstrated a normal response to exercise.  There was no ST segment deviation noted during stress.   ETT with poor exercise tolerance (2:31); no chest pain; normal blood pressure response; no diagnostic ST changes; no exercise-induced arrhythmias; study terminated secondary to leg pain and dyspnea; negative inadequate exercise tolerance test (patient only achieved 79% target heart rate).  TTE 03/04/2020: 1. Left ventricular ejection fraction, by estimation, is 60 to 65%. The  left ventricle has normal function. The left ventricle has no regional  wall motion abnormalities. There is mild left ventricular hypertrophy.  Left ventricular diastolic parameters  were normal.  2. Right ventricular systolic function is normal. The right ventricular  size is normal. There is normal pulmonary  artery systolic pressure.  3. The mitral valve is normal in structure. Mild mitral valve  regurgitation. No evidence of mitral stenosis.  4. The aortic valve is normal in structure. Aortic valve regurgitation is  not  visualized. No aortic stenosis is present.  5. The inferior vena cava is normal in size with greater than 50%  respiratory variability, suggesting right atrial pressure of 3 mmHg.   )        Anesthesia Quick Evaluation

## 2024-04-17 NOTE — Progress Notes (Signed)
 Established Patient Office Visit  Subjective  Patient ID: Mia [name], female    DOB: 03/16/1951  Age: 73 y.o. MRN: [id number]  Chief Complaint  Patient presents with   Hypertension   ifg   B12 Injection    HPI  Here for follow-up blood pressure and prediabetes she is scheduled for a transforaminal lumbar interbody fusion with pedicle screw fixation with Dr. Oneil Priestly at orthopedics.  She just had a tremendous amount of pain she is mostly been using NSAIDs and Tylenol .  She said she was given a prescription for the hydromorphone  but she has barely touched it.  But now she knows she has to be completely off of NSAIDs before her surgery.  She does have an upcoming telephonic visit for cardiac clearance.  She says her blood pressure still going up and down and she says that she takes her valsartan  and her metoprolol  in the morning and then by midmorning her blood pressure looks great similar to what it is right now 116/62 but then by the afternoon it starts to go high again so she doses her amlodipine  around 3:30 in the afternoon and then takes her second dose of valsartan  and metoprolol  at night she cannot quite figure out why it tends to go higher in the afternoon but looks great in the mornings.   He would also like to go ahead and get her B12 injection today.  He also wanted to let me know that she actually has torn the tendon in her left knee that she previously had repair it is not completely ruptured but it is torn again.     Mia    Objective:     BP 116/62   Pulse 64   Ht 5' 6 (1.676 m)   Wt 163 lb 0.6 oz (74 kg)   SpO2 97%   BMI 26.32 kg/m    Physical Exam Vitals and nursing note reviewed.  Constitutional:      Appearance: Normal appearance.  HENT:     Head: Normocephalic and atraumatic.   Eyes:     Conjunctiva/sclera: Conjunctivae normal.    Cardiovascular:     Rate and Rhythm: Normal rate and regular rhythm.  Pulmonary:     Effort: Pulmonary effort  is normal.     Breath sounds: Normal breath sounds.   Skin:    General: Skin is warm and dry.   Neurological:     Mental Status: She is alert.   Psychiatric:        Mood and Affect: Mood normal.      Results for orders placed or performed in visit on 04/17/24  POCT HgB A1C  Result Value Ref Range   Hemoglobin A1C 5.2 4.0 - 5.6 %   HbA1c POC (<> result, manual entry)     HbA1c, POC (prediabetic range)     HbA1c, POC (controlled diabetic range)        The 10-year ASCVD risk score (Arnett DK, et al., 2019) is: 21.9%    Assessment & Plan:   Problem List Items Addressed This Visit       Cardiovascular and Mediastinum   Hypertension   Blood pressure on target today.  Continue current regimen.        Respiratory   OSA (obstructive sleep apnea)   She currently gets her supplies through [facility] for her CPAP we will call to see if we cannot get an updated download to make sure that her settings are adequate she  does use it nightly and does notice improvement when she uses it.        Endocrine   IFG (impaired fasting glucose)   A1c looks fantastic today at 5.2.  Continue current regimen.      Relevant Orders   POCT HgB A1C (Completed)     Other   History of DVT of lower extremity   I did encourage her to speak with a cardiologist about her prior history of DVT they might want to consider some short-term prophylaxis right after surgery.      Fibromyalgia   She does report that some days she just wakes up hurting all over and she is not sure if it is related to her fibromyalgia or not it sounds like she has a flare we did discuss that sometimes increased activity can trigger this especially if she is hurting all over.  And sometimes that can even take multiple days to recover.      B12 deficiency - Primary   Routine B12 injection given today.  Continue every 4 weeks.       No follow-ups on file.    Dorothyann Byars, MD

## 2024-04-17 NOTE — Assessment & Plan Note (Signed)
 Routine B12 injection given today.  Continue every 4 weeks.

## 2024-04-17 NOTE — Assessment & Plan Note (Signed)
 She currently gets her supplies through [facility] for her CPAP we will call to see if we cannot get an updated download to make sure that her settings are adequate she does use it nightly and does notice improvement when she uses it.

## 2024-04-17 NOTE — Assessment & Plan Note (Signed)
 She does report that some days she just wakes up hurting all over and she is not sure if it is related to her fibromyalgia or not it sounds like she has a flare we did discuss that sometimes increased activity can trigger this especially if she is hurting all over.  And sometimes that can even take multiple days to recover.

## 2024-04-17 NOTE — Progress Notes (Signed)
 Virtual Visit via Telephone Note   Because of Mia [name] co-morbid illnesses, she is at least at moderate risk for complications without adequate follow up.  This format is felt to be most appropriate for this patient at this time.  Due to technical limitations with video connection (technology), today's appointment will be conducted as an audio only telehealth visit, and Mia [name] verbally agreed to proceed in this manner.   All issues noted in this document were discussed and addressed.  No physical exam could be performed with this format.  Evaluation Performed:  Preoperative cardiovascular risk assessment _____________   Date:  04/17/2024   Patient ID:  Mia [name], DOB 11-11-1950, MRN [id number] Patient Location:  Home Provider location:   Office  Primary Care Provider:  Alvan Dorothyann BIRCH, MD Primary Cardiologist:  Aleene Passe, MD  Chief Complaint / Patient Profile   73 y.o. y/o female with a h/o paroxysmal SVT, chronic diastolic CHF who is pending lumbar interbody fusion and decompression and presents today for telephonic preoperative cardiovascular risk assessment.  History of Present Illness    Mia [name] is a 73 y.o. female who presents via audio/video conferencing for a telehealth visit today.  Pt was last seen in cardiology clinic on 01/17/2024 by Orren Fabry, PA-C.  At that time Mia [name] was doing well .  The patient is now pending procedure as outlined above. Since her last visit, she continues to be stable from a cardiac standpoint.  She does note somewhat elevated blood pressures that are labile.  I will increase her amlodipine  from 5 mg to 7 and half milligrams daily.  Today she denies chest pain, shortness of breath, lower extremity edema, fatigue, palpitations, melena, hematuria, hemoptysis, diaphoresis, weakness, presyncope, syncope, orthopnea, and PND.   Past Medical History    Past Medical History:  Diagnosis Date   Anal fissure    Anemia     Arthritis    Blood transfusion    C. difficile colitis    Chest pain    a. 01/2013 MV: EF 59%, no ischemia.   Chronic Dyspnea    a. 01/2013 Echo: EF 60-65%, Gr 1 DD, PASP 42mmHg.// Echo 01/2020: EF 60-65, no RWMA, GR 1 DD, GLS -21.6%, normal RV SF, trivial MR    COVID-19    DDD (degenerative disc disease), cervical    DDD (degenerative disc disease), lumbar    DVT (deep venous thrombosis) (HCC) 06/23/2023   left lower extremity   Fibromyalgia    Gall stones    GERD (gastroesophageal reflux disease)    gastritis   Hemorrhoids    Hypertension    Interstitial cystitis    MCI (mild cognitive impairment) 11/25/2017   Memory difficulty 12/14/2013   Neuromuscular disorder (HCC)    sclerosis   Osteoporosis    Peptic ulcer    PONV (postoperative nausea and vomiting)    Pseudogout    Raynaud phenomenon    Rectal bleeding    Sleep apnea    a. on cpap.   Stress fracture of left tibia 11/05/2016   SVT (supraventricular tachycardia) (HCC)    Syncope 09/10/2015   Thyroid  disease    hypothyroidism   Tremor, essential 05/14/2016   Past Surgical History:  Procedure Laterality Date   APPENDECTOMY     BLADDER SURGERY     x2   BLADDER SURGERY     BREAST BIOPSY     CARDIAC CATHETERIZATION     CARPAL TUNNEL RELEASE  CATARACT EXTRACTION Bilateral    CHOLECYSTECTOMY     DILATION AND CURETTAGE OF UTERUS     ENTEROCELE REPAIR     x2   EYE SURGERY     retina   herniated disc  11/25/2021   L4 and L5   hysterectomy - unknown type     JOINT REPLACEMENT     KNEE ARTHROPLASTY     KNEE SURGERY     x6   NECK SURGERY     fusion   OOPHORECTOMY     QUADRICEPS REPAIR Right    RECTOCELE REPAIR     x2   SHOULDER ARTHROSCOPY WITH SUBACROMIAL DECOMPRESSION, ROTATOR CUFF REPAIR AND BICEP TENDON REPAIR  10/06/2012   Procedure: SHOULDER ARTHROSCOPY WITH SUBACROMIAL DECOMPRESSION, ROTATOR CUFF REPAIR AND BICEP TENDON REPAIR;  Surgeon: Eva Elsie Herring, MD;  Location: [facility];  Service: Orthopedics;  Laterality: Right;  Arthroscopic  Repair  of  Subscapularis, Open Biceps Tenodesis   SHOULDER SURGERY     bilateral- bones spur   SHOULDER SURGERY Left 04/15/2021   TONSILLECTOMY     TOTAL KNEE ARTHROPLASTY     TOTAL SHOULDER ARTHROPLASTY      Allergies  Allergies  Allergen Reactions   Codeine Nausea And Vomiting    Other Reaction(s): UNKNOWN   Myrbetriq  [Mirabegron] Other (See Comments)    SEVERE HEADACHE   Nystatin Other (See Comments)    Unknown reaction   Percocet [Oxycodone-Acetaminophen ] Nausea And Vomiting   Propoxyphene Nausea And Vomiting    Other Reaction(s): UNKNOWN   Erythromycin Base     Other Reaction(s): UNKNOWN   Ketorolac      Other Reaction(s): UNKNOWN   Oxycodone-Acetaminophen  Nausea Only   Celecoxib Other (See Comments)    Headache/flushed     Darvocet [Propoxyphene N-Acetaminophen ] Nausea And Vomiting   Erythromycin Nausea And Vomiting    Nausea and vomiting    Hydrocodone  Nausea And Vomiting   Nitrofurantoin Nausea And Vomiting    Other Reaction(s): UNKNOWN   Pregabalin  Swelling    Legs swelling     Toradol  [Ketorolac  Tromethamine ] Nausea And Vomiting    Per patient, only PO form causes nausea and vomiting. Can take injection without issue   Tramadol Nausea And Vomiting   Verapamil Nausea And Vomiting    Home Medications    Prior to Admission medications   Medication Sig Start Date End Date Taking? Authorizing Provider  acetaminophen  (TYLENOL ) 500 MG tablet Take 1,000 mg by mouth every 6 (six) hours as needed for moderate pain (pain score 4-6) (pain.).    [provider]  albuterol  (VENTOLIN  HFA) 108 (90 Base) MCG/ACT inhaler Inhale 1-2 puffs into the lungs every 6 (six) hours as needed for shortness of breath or wheezing. 10/17/21   [provider]  AMBULATORY NON FORMULARY MEDICATION Medication Name: CPAP with humidifier set to 5 to 20 cm of water pressure.  Fullface mask.  Please fax  download back to [phone] 3:06 weeks. 11/18/23   Alvan Dorothyann BIRCH, MD  amLODipine  (NORVASC ) 5 MG tablet Take 1 tablet (5 mg total) by mouth daily. 01/17/24   Lucien Orren SAILOR, PA-C  amoxicillin  (AMOXIL ) 500 MG capsule Take 2,000 mg by mouth as needed (BEFORE DENTAL PROCEDURES).    [provider]  colestipol (COLESTID) 1 g tablet Take 1 g by mouth daily at 12 noon.    [provider]  cyanocobalamin  (VITAMIN B12) 1000 MCG/ML injection Inject 1,000 mcg into the muscle every 28 (twenty-eight) days.  [provider]  donepezil  (ARICEPT ) 10 MG tablet TAKE 1 TABLET IN THE EVENING AT BEDTIME 02/22/24   Millikan, Megan, NP  gabapentin  (NEURONTIN ) 300 MG capsule TAKE 1 CAPSULE BY MOUTH EVERY MORNING AND AT BEDTIME Patient taking differently: Take 300 mg by mouth 2 (two) times daily. 12/03/23   Metheney, Catherine D, MD  ipratropium (ATROVENT ) 0.06 % nasal spray Place 2 sprays into both nostrils 2 (two) times daily as needed for rhinitis.    [provider]  leflunomide  (ARAVA ) 10 MG tablet Take 10 mg po every other day alternating with 20 mg po every other day 02/14/24   Dale, Taylor M, PA-C  meclizine  (ANTIVERT ) 25 MG tablet Take 1 tablet (25 mg total) by mouth 3 (three) times daily as needed for dizziness. 02/14/24   Alvan Dorothyann BIRCH, MD  Melatonin 10 MG CAPS Take 10 mg by mouth at bedtime.    [provider]  memantine  (NAMENDA ) 10 MG tablet TAKE 1 TABLET BY MOUTH TWO TIMES A DAY MUST SEE DR FOR FURTHER REFILLS Patient taking differently: Take 10 mg by mouth 2 (two) times daily. 12/15/23   Millikan, Megan, NP  metoprolol  tartrate (LOPRESSOR ) 100 MG tablet Take 1 tablet (100 mg total) by mouth 2 (two) times daily. 01/17/24   Lucien Orren SAILOR, PA-C  pantoprazole  (PROTONIX ) 20 MG tablet Take 20 mg by mouth 2 (two) times daily. 10/30/22   [provider]  primidone  (MYSOLINE ) 50 MG tablet TAKE 1 TABLET BY MOUTH EVERYDAY AT BEDTIME 07/01/23   Buck Saucer, MD   rosuvastatin  (CRESTOR ) 5 MG tablet Take 1 tablet (5 mg total) by mouth daily. 01/17/24   Lucien Orren SAILOR, PA-C  sodium chloride  (MURO 128) 5 % ophthalmic solution Place 1 drop into both eyes in the morning, at noon, in the evening, and at bedtime.    [provider]  sucralfate  (CARAFATE ) 1 g tablet Take 1 g by mouth 4 (four) times daily as needed (GI burninh). 12/30/23   [provider]  valsartan  (DIOVAN ) 160 MG tablet Take 1 tablet (160 mg total) by mouth 2 (two) times daily. 01/17/24   Lucien Orren SAILOR, PA-C  vancomycin (VANCOCIN) 125 MG capsule Take 125 mg by mouth as directed. PER PT TO TAKE BEFORE HER SURGERY. 04/13/24 04/23/24  [provider]  venlafaxine  XR (EFFEXOR -XR) 75 MG 24 hr capsule TAKE 1 CAPSULE DAILY WITH BREAKFAST 02/14/24   Sherryl Bouchard, NP    Physical Exam    Vital Signs:  Mia [name] does not have vital signs available for review today.  Given telephonic nature of communication, physical exam is limited. AAOx3. NAD. Normal affect.  Speech and respirations are unlabored.  Accessory Clinical Findings    None  Assessment & Plan    1.  Preoperative Cardiovascular Risk Assessment:Procedure:  LEFT-SIDED LUMBAR 4 - LUMBAR 5 TRANSFORAMINAL LUMBAR INTERBODY FUSION AND DECOMPRESSION WITH INSTRUMENTATION AND ALLOGRAFT    Date of Surgery:  Clearance 04/20/24      (URGENT)                             Surgeon:  ONEIL PRIESTLY, MD Surgeon's Group or Practice Name:  [facility] Phone number:  [phone] Fax number:  [phone]      Primary Cardiologist: Aleene Passe, MD  Chart reviewed as part of pre-operative protocol coverage. Given past medical history and time since last visit, based on ACC/AHA guidelines, Mia [name] would be at acceptable risk for the planned procedure without further cardiovascular testing.   Her RCRI is low risk, 0.9% risk of major cardiac event.  She is able to complete greater than 4  METS of physical activity.  Patient was advised that if she develops new symptoms prior to surgery to contact our office to arrange a follow-up appointment.  He verbalized understanding.  I will route this recommendation to the requesting party via Epic fax function and remove from pre-op pool.      Time:   Today, I have spent  16  minutes with the patient with telehealth technology discussing medical history, symptoms, and management plan.  I spent 10 minutes reviewing patient's past cardiac history and cardiac medications.    Mia CHRISTELLA Beauvais, NP  04/17/2024, 7:00 AM

## 2024-04-20 ENCOUNTER — Observation Stay (HOSPITAL_COMMUNITY)
Admission: RE | Admit: 2024-04-20 | Discharge: 2024-04-21 | Disposition: A | Discharge status: Home Health | Attending: Orthopedic Surgery | Admitting: Orthopedic Surgery

## 2024-04-20 ENCOUNTER — Encounter (HOSPITAL_COMMUNITY)
Admission: RE | Disposition: A | Discharge status: Home Health | Payer: Self-pay | Source: Home / Self Care | Attending: Orthopedic Surgery

## 2024-04-20 ENCOUNTER — Ambulatory Visit (HOSPITAL_COMMUNITY): Payer: Self-pay | Admitting: Physician Assistant

## 2024-04-20 ENCOUNTER — Ambulatory Visit (HOSPITAL_BASED_OUTPATIENT_CLINIC_OR_DEPARTMENT_OTHER): Payer: Self-pay | Admitting: Physician Assistant

## 2024-04-20 ENCOUNTER — Ambulatory Visit (HOSPITAL_COMMUNITY)

## 2024-04-20 ENCOUNTER — Encounter (HOSPITAL_COMMUNITY): Payer: Self-pay | Admitting: Orthopedic Surgery

## 2024-04-20 DIAGNOSIS — I509 Heart failure, unspecified: Secondary | ICD-10-CM

## 2024-04-20 DIAGNOSIS — M4316 Spondylolisthesis, lumbar region: Principal | ICD-10-CM | POA: Insufficient documentation

## 2024-04-20 DIAGNOSIS — M48061 Spinal stenosis, lumbar region without neurogenic claudication: Secondary | ICD-10-CM | POA: Insufficient documentation

## 2024-04-20 DIAGNOSIS — I110 Hypertensive heart disease with heart failure: Secondary | ICD-10-CM | POA: Diagnosis not present

## 2024-04-20 DIAGNOSIS — M5416 Radiculopathy, lumbar region: Secondary | ICD-10-CM | POA: Diagnosis not present

## 2024-04-20 DIAGNOSIS — Z96619 Presence of unspecified artificial shoulder joint: Secondary | ICD-10-CM | POA: Insufficient documentation

## 2024-04-20 DIAGNOSIS — Z79899 Other long term (current) drug therapy: Secondary | ICD-10-CM | POA: Diagnosis not present

## 2024-04-20 DIAGNOSIS — I2510 Atherosclerotic heart disease of native coronary artery without angina pectoris: Secondary | ICD-10-CM

## 2024-04-20 DIAGNOSIS — I10 Essential (primary) hypertension: Secondary | ICD-10-CM | POA: Insufficient documentation

## 2024-04-20 DIAGNOSIS — Z86718 Personal history of other venous thrombosis and embolism: Secondary | ICD-10-CM | POA: Diagnosis not present

## 2024-04-20 DIAGNOSIS — Z8616 Personal history of COVID-19: Secondary | ICD-10-CM | POA: Insufficient documentation

## 2024-04-20 DIAGNOSIS — Z96659 Presence of unspecified artificial knee joint: Secondary | ICD-10-CM | POA: Insufficient documentation

## 2024-04-20 DIAGNOSIS — M5410 Radiculopathy, site unspecified: Principal | ICD-10-CM | POA: Diagnosis present

## 2024-04-20 HISTORY — PX: TRANSFORAMINAL LUMBAR INTERBODY FUSION (TLIF) WITH PEDICLE SCREW FIXATION 1 LEVEL: SHX6141

## 2024-04-20 SURGERY — TRANSFORAMINAL LUMBAR INTERBODY FUSION (TLIF) WITH PEDICLE SCREW FIXATION 1 LEVEL
Anesthesia: General | Laterality: Left

## 2024-04-20 MED ORDER — SUCRALFATE 1 G PO TABS: 1.0000 g | ORAL_TABLET | Freq: Four times a day (QID) | ORAL | Status: DC | PRN

## 2024-04-20 MED ORDER — BUPIVACAINE LIPOSOME 1.3 % IJ SUSP
INTRAMUSCULAR | Status: AC
  Filled 2024-04-20: qty 20

## 2024-04-20 MED ORDER — SODIUM CHLORIDE 0.9% FLUSH: 3.0000 mL | Freq: Two times a day (BID) | INTRAVENOUS | Status: DC

## 2024-04-20 MED ORDER — EPHEDRINE SULFATE-NACL 50-0.9 MG/10ML-% IV SOSY
PREFILLED_SYRINGE | INTRAVENOUS | Status: DC | PRN
Start: 2024-04-20 — End: 2024-04-20
  Administered 2024-04-20: 5 mg via INTRAVENOUS
  Administered 2024-04-20: 10 mg via INTRAVENOUS
  Administered 2024-04-20: 25 mg via INTRAVENOUS

## 2024-04-20 MED ORDER — MELATONIN 5 MG PO TABS
5.0000 mg | ORAL_TABLET | Freq: Every day | ORAL | Status: DC
  Administered 2024-04-20: 5 mg via ORAL
  Filled 2024-04-20: qty 1

## 2024-04-20 MED ORDER — HYDROMORPHONE HCL 1 MG/ML IJ SOLN
0.2500 mg | INTRAMUSCULAR | Status: DC | PRN
  Administered 2024-04-20 (×2): 0.25 mg via INTRAVENOUS
  Administered 2024-04-20: 0.5 mg via INTRAVENOUS
  Administered 2024-04-20: 0.25 mg via INTRAVENOUS

## 2024-04-20 MED ORDER — HYDROMORPHONE HCL 2 MG PO TABS
1.0000 mg | ORAL_TABLET | Freq: Four times a day (QID) | ORAL | Status: DC | PRN
  Administered 2024-04-20: 2 mg via ORAL
  Filled 2024-04-20: qty 1

## 2024-04-20 MED ORDER — HYDROMORPHONE HCL 1 MG/ML IJ SOLN
INTRAMUSCULAR | Status: AC
  Filled 2024-04-20: qty 1

## 2024-04-20 MED ORDER — IPRATROPIUM BROMIDE 0.06 % NA SOLN: 2.0000 | Freq: Two times a day (BID) | NASAL | Status: DC | PRN

## 2024-04-20 MED ORDER — ZOLPIDEM TARTRATE 5 MG PO TABS: 5.0000 mg | ORAL_TABLET | Freq: Every evening | ORAL | Status: DC | PRN

## 2024-04-20 MED ORDER — ONDANSETRON HCL 4 MG/2ML IJ SOLN: 4.0000 mg | Freq: Four times a day (QID) | INTRAMUSCULAR | Status: DC | PRN

## 2024-04-20 MED ORDER — ALUM & MAG HYDROXIDE-SIMETH 200-200-20 MG/5ML PO SUSP: 30.0000 mL | Freq: Four times a day (QID) | ORAL | Status: DC | PRN

## 2024-04-20 MED ORDER — PANTOPRAZOLE SODIUM 20 MG PO TBEC
20.0000 mg | DELAYED_RELEASE_TABLET | Freq: Two times a day (BID) | ORAL | Status: DC
  Administered 2024-04-20 – 2024-04-21 (×2): 20 mg via ORAL
  Filled 2024-04-20 (×2): qty 1

## 2024-04-20 MED ORDER — AMISULPRIDE (ANTIEMETIC) 5 MG/2ML IV SOLN: 10.0000 mg | Freq: Once | INTRAVENOUS | Status: DC | PRN

## 2024-04-20 MED ORDER — BSS IO SOLN
INTRAOCULAR | Status: AC
  Filled 2024-04-20: qty 15

## 2024-04-20 MED ORDER — PROPOFOL 10 MG/ML IV BOLUS
INTRAVENOUS | Status: AC
  Filled 2024-04-20: qty 20

## 2024-04-20 MED ORDER — HYDROMORPHONE HCL 1 MG/ML IJ SOLN
INTRAMUSCULAR | Status: AC
  Filled 2024-04-20: qty 0.5

## 2024-04-20 MED ORDER — 0.9 % SODIUM CHLORIDE (POUR BTL) OPTIME
TOPICAL | Status: DC | PRN
  Administered 2024-04-20 (×2): 1000 mL

## 2024-04-20 MED ORDER — VENLAFAXINE HCL ER 75 MG PO CP24
75.0000 mg | ORAL_CAPSULE | Freq: Every day | ORAL | Status: DC
  Administered 2024-04-21: 75 mg via ORAL
  Filled 2024-04-20: qty 1

## 2024-04-20 MED ORDER — FENTANYL CITRATE (PF) 250 MCG/5ML IJ SOLN
INTRAMUSCULAR | Status: DC | PRN
  Administered 2024-04-20: 50 ug via INTRAVENOUS
  Administered 2024-04-20: 100 ug via INTRAVENOUS

## 2024-04-20 MED ORDER — PHENYLEPHRINE 80 MCG/ML (10ML) SYRINGE FOR IV PUSH (FOR BLOOD PRESSURE SUPPORT)
PREFILLED_SYRINGE | INTRAVENOUS | Status: DC | PRN
  Administered 2024-04-20 (×2): 40 ug via INTRAVENOUS

## 2024-04-20 MED ORDER — BUPIVACAINE LIPOSOME 1.3 % IJ SUSP
INTRAMUSCULAR | Status: DC | PRN
  Administered 2024-04-20: 20 mL

## 2024-04-20 MED ORDER — METHOCARBAMOL 500 MG PO TABS
500.0000 mg | ORAL_TABLET | Freq: Four times a day (QID) | ORAL | Status: DC | PRN
  Administered 2024-04-21 (×2): 500 mg via ORAL
  Filled 2024-04-20 (×2): qty 1

## 2024-04-20 MED ORDER — SENNOSIDES-DOCUSATE SODIUM 8.6-50 MG PO TABS: 1.0000 | ORAL_TABLET | Freq: Every evening | ORAL | Status: DC | PRN

## 2024-04-20 MED ORDER — BUPIVACAINE-EPINEPHRINE 0.25% -1:200000 IJ SOLN
INTRAMUSCULAR | Status: DC | PRN
  Administered 2024-04-20: 20 mL
  Administered 2024-04-20: 5 mL

## 2024-04-20 MED ORDER — CHLORHEXIDINE GLUCONATE 0.12 % MT SOLN
15.0000 mL | Freq: Once | OROMUCOSAL | Status: AC
  Administered 2024-04-20: 15 mL via OROMUCOSAL
  Filled 2024-04-20: qty 15

## 2024-04-20 MED ORDER — DONEPEZIL HCL 10 MG PO TABS
10.0000 mg | ORAL_TABLET | Freq: Every day | ORAL | Status: DC
  Administered 2024-04-20: 10 mg via ORAL
  Filled 2024-04-20 (×2): qty 1

## 2024-04-20 MED ORDER — THROMBIN 20000 UNITS EX SOLR
CUTANEOUS | Status: AC
  Filled 2024-04-20: qty 20000

## 2024-04-20 MED ORDER — ONDANSETRON HCL 4 MG PO TABS: 4.0000 mg | ORAL_TABLET | Freq: Four times a day (QID) | ORAL | Status: DC | PRN

## 2024-04-20 MED ORDER — SODIUM CHLORIDE 0.9% FLUSH: 3.0000 mL | INTRAVENOUS | Status: DC | PRN

## 2024-04-20 MED ORDER — GLYCOPYRROLATE PF 0.2 MG/ML IJ SOSY
PREFILLED_SYRINGE | INTRAMUSCULAR | Status: DC | PRN
Start: 2024-04-20 — End: 2024-04-20
  Administered 2024-04-20: .2 mg via INTRAVENOUS

## 2024-04-20 MED ORDER — BISACODYL 5 MG PO TBEC: 5.0000 mg | DELAYED_RELEASE_TABLET | Freq: Every day | ORAL | Status: DC | PRN

## 2024-04-20 MED ORDER — ONDANSETRON HCL 4 MG/2ML IJ SOLN
INTRAMUSCULAR | Status: AC
  Filled 2024-04-20: qty 2

## 2024-04-20 MED ORDER — MIDAZOLAM HCL 2 MG/2ML IJ SOLN
INTRAMUSCULAR | Status: AC
  Filled 2024-04-20: qty 2

## 2024-04-20 MED ORDER — FLEET ENEMA RE ENEM: 1.0000 | ENEMA | Freq: Once | RECTAL | Status: DC | PRN

## 2024-04-20 MED ORDER — MORPHINE SULFATE (PF) 2 MG/ML IV SOLN: 1.0000 mg | INTRAVENOUS | Status: DC | PRN

## 2024-04-20 MED ORDER — POVIDONE-IODINE 7.5 % EX SOLN
Freq: Once | CUTANEOUS | Status: AC
Start: 2024-04-20 — End: 2024-04-20
  Filled 2024-04-20: qty 118

## 2024-04-20 MED ORDER — VANCOMYCIN HCL 500 MG IV SOLR
INTRAVENOUS | Status: DC | PRN
  Administered 2024-04-20: 500 mg via TOPICAL

## 2024-04-20 MED ORDER — SODIUM CHLORIDE 0.9 % IV SOLN: 250.0000 mL | INTRAVENOUS | Status: DC

## 2024-04-20 MED ORDER — ACETAMINOPHEN 650 MG RE SUPP
650.0000 mg | RECTAL | Status: DC | PRN
Start: 2024-04-20 — End: 2024-04-21

## 2024-04-20 MED ORDER — ALBUTEROL SULFATE (2.5 MG/3ML) 0.083% IN NEBU: 3.0000 mL | INHALATION_SOLUTION | Freq: Four times a day (QID) | RESPIRATORY_TRACT | Status: DC | PRN

## 2024-04-20 MED ORDER — IRBESARTAN 150 MG PO TABS
150.0000 mg | ORAL_TABLET | Freq: Every day | ORAL | Status: DC
  Administered 2024-04-21: 150 mg via ORAL
  Filled 2024-04-20: qty 1

## 2024-04-20 MED ORDER — MEMANTINE HCL 10 MG PO TABS
10.0000 mg | ORAL_TABLET | Freq: Two times a day (BID) | ORAL | Status: DC
  Administered 2024-04-20 – 2024-04-21 (×2): 10 mg via ORAL
  Filled 2024-04-20 (×3): qty 1

## 2024-04-20 MED ORDER — ROCURONIUM BROMIDE 10 MG/ML (PF) SYRINGE
PREFILLED_SYRINGE | INTRAVENOUS | Status: AC
  Filled 2024-04-20: qty 10

## 2024-04-20 MED ORDER — HYDROMORPHONE HCL 2 MG PO TABS
1.0000 mg | ORAL_TABLET | ORAL | Status: DC | PRN
  Administered 2024-04-20 – 2024-04-21 (×4): 2 mg via ORAL
  Filled 2024-04-20 (×4): qty 1

## 2024-04-20 MED ORDER — METOPROLOL TARTRATE 50 MG PO TABS
100.0000 mg | ORAL_TABLET | Freq: Two times a day (BID) | ORAL | Status: DC
  Administered 2024-04-20 – 2024-04-21 (×2): 100 mg via ORAL
  Filled 2024-04-20 (×2): qty 2

## 2024-04-20 MED ORDER — COLESTIPOL HCL 1 G PO TABS
1.0000 g | ORAL_TABLET | Freq: Every day | ORAL | Status: DC
  Filled 2024-04-20: qty 1

## 2024-04-20 MED ORDER — BUPIVACAINE-EPINEPHRINE (PF) 0.25% -1:200000 IJ SOLN
INTRAMUSCULAR | Status: AC
Start: 2024-04-20 — End: 2024-04-20
  Filled 2024-04-20: qty 30

## 2024-04-20 MED ORDER — METHOCARBAMOL 1000 MG/10ML IJ SOLN: 500.0000 mg | Freq: Four times a day (QID) | INTRAMUSCULAR | Status: DC | PRN

## 2024-04-20 MED ORDER — FENTANYL CITRATE (PF) 250 MCG/5ML IJ SOLN
INTRAMUSCULAR | Status: AC
  Filled 2024-04-20: qty 5

## 2024-04-20 MED ORDER — ACETAMINOPHEN 325 MG PO TABS: 650.0000 mg | ORAL_TABLET | ORAL | Status: DC | PRN

## 2024-04-20 MED ORDER — CEFAZOLIN SODIUM-DEXTROSE 2-4 GM/100ML-% IV SOLN
2.0000 g | Freq: Three times a day (TID) | INTRAVENOUS | Status: AC
  Administered 2024-04-20 (×2): 2 g via INTRAVENOUS
  Filled 2024-04-20 (×2): qty 100

## 2024-04-20 MED ORDER — VANCOMYCIN HCL 500 MG IV SOLR
INTRAVENOUS | Status: AC
  Filled 2024-04-20: qty 10

## 2024-04-20 MED ORDER — AMLODIPINE BESYLATE 5 MG PO TABS
7.5000 mg | ORAL_TABLET | Freq: Every day | ORAL | Status: DC
  Administered 2024-04-20 – 2024-04-21 (×2): 7.5 mg via ORAL
  Filled 2024-04-20 (×2): qty 2

## 2024-04-20 MED ORDER — PHENOL 1.4 % MT LIQD: 1.0000 | OROMUCOSAL | Status: DC | PRN

## 2024-04-20 MED ORDER — DEXAMETHASONE SODIUM PHOSPHATE 10 MG/ML IJ SOLN
INTRAMUSCULAR | Status: AC
  Filled 2024-04-20: qty 1

## 2024-04-20 MED ORDER — PROPOFOL 10 MG/ML IV BOLUS
INTRAVENOUS | Status: DC | PRN
  Administered 2024-04-20 (×2): 80 mg via INTRAVENOUS

## 2024-04-20 MED ORDER — BSS IO SOLN
15.0000 mL | Freq: Once | INTRAOCULAR | Status: AC
  Administered 2024-04-20: 15 mL

## 2024-04-20 MED ORDER — ORAL CARE MOUTH RINSE: 15.0000 mL | Freq: Once | OROMUCOSAL | Status: AC

## 2024-04-20 MED ORDER — LEFLUNOMIDE 10 MG PO TABS
10.0000 mg | ORAL_TABLET | Freq: Every day | ORAL | Status: DC
  Filled 2024-04-20: qty 2

## 2024-04-20 MED ORDER — PHENYLEPHRINE 80 MCG/ML (10ML) SYRINGE FOR IV PUSH (FOR BLOOD PRESSURE SUPPORT)
PREFILLED_SYRINGE | INTRAVENOUS | Status: AC
  Filled 2024-04-20: qty 10

## 2024-04-20 MED ORDER — DEXAMETHASONE SODIUM PHOSPHATE 10 MG/ML IJ SOLN
INTRAMUSCULAR | Status: DC | PRN
Start: 2024-04-20 — End: 2024-04-20
  Administered 2024-04-20: 10 mg via INTRAVENOUS

## 2024-04-20 MED ORDER — PHENYLEPHRINE HCL-NACL 20-0.9 MG/250ML-% IV SOLN
INTRAVENOUS | Status: DC | PRN
  Administered 2024-04-20: 15 ug/min via INTRAVENOUS

## 2024-04-20 MED ORDER — ROSUVASTATIN CALCIUM 5 MG PO TABS
5.0000 mg | ORAL_TABLET | Freq: Every day | ORAL | Status: DC
  Administered 2024-04-21: 5 mg via ORAL
  Filled 2024-04-20: qty 1

## 2024-04-20 MED ORDER — CEFAZOLIN SODIUM-DEXTROSE 2-4 GM/100ML-% IV SOLN
2.0000 g | INTRAVENOUS | Status: AC
  Administered 2024-04-20: 2 g via INTRAVENOUS
  Filled 2024-04-20: qty 100

## 2024-04-20 MED ORDER — THROMBIN 20000 UNITS EX KIT
PACK | CUTANEOUS | Status: DC | PRN
  Administered 2024-04-20: 10 mL via TOPICAL

## 2024-04-20 MED ORDER — ONDANSETRON HCL 4 MG/2ML IJ SOLN
INTRAMUSCULAR | Status: DC | PRN
Start: 2024-04-20 — End: 2024-04-20
  Administered 2024-04-20: 4 mg via INTRAVENOUS

## 2024-04-20 MED ORDER — SUGAMMADEX SODIUM 200 MG/2ML IV SOLN
INTRAVENOUS | Status: DC | PRN
Start: 2024-04-20 — End: 2024-04-20
  Administered 2024-04-20: 200 mg via INTRAVENOUS

## 2024-04-20 MED ORDER — LIDOCAINE HCL (CARDIAC) PF 100 MG/5ML IV SOSY
PREFILLED_SYRINGE | INTRAVENOUS | Status: DC | PRN
  Administered 2024-04-20: 60 mg via INTRATRACHEAL

## 2024-04-20 MED ORDER — SODIUM CHLORIDE (HYPERTONIC) 5 % OP SOLN: 1.0000 [drp] | OPHTHALMIC | Status: DC | PRN

## 2024-04-20 MED ORDER — PRIMIDONE 50 MG PO TABS
50.0000 mg | ORAL_TABLET | Freq: Every day | ORAL | Status: DC
  Administered 2024-04-20: 50 mg via ORAL
  Filled 2024-04-20 (×2): qty 1

## 2024-04-20 MED ORDER — LACTATED RINGERS IV SOLN: INTRAVENOUS | Status: DC

## 2024-04-20 MED ORDER — POLYMYXIN B-TRIMETHOPRIM 10000-0.1 UNIT/ML-% OP SOLN
1.0000 [drp] | Freq: Four times a day (QID) | OPHTHALMIC | Status: DC
Start: 2024-04-20 — End: 2024-04-20
  Administered 2024-04-20: 1 [drp] via OPHTHALMIC
  Filled 2024-04-20: qty 10

## 2024-04-20 MED ORDER — SODIUM CHLORIDE 0.9 % IV SOLN: INTRAVENOUS | Status: DC | PRN

## 2024-04-20 MED ORDER — ALBUMIN HUMAN 5 % IV SOLN
INTRAVENOUS | Status: DC | PRN
Start: 2024-04-20 — End: 2024-04-20

## 2024-04-20 MED ORDER — SODIUM CHLORIDE 0.9 % IV SOLN: 12.5000 mg | INTRAVENOUS | Status: DC | PRN

## 2024-04-20 MED ORDER — LIDOCAINE 2% (20 MG/ML) 5 ML SYRINGE
INTRAMUSCULAR | Status: AC
  Filled 2024-04-20: qty 5

## 2024-04-20 MED ORDER — MECLIZINE HCL 25 MG PO TABS: 25.0000 mg | ORAL_TABLET | Freq: Three times a day (TID) | ORAL | Status: DC | PRN

## 2024-04-20 MED ORDER — SODIUM CHLORIDE 0.9% FLUSH
3.0000 mL | Freq: Two times a day (BID) | INTRAVENOUS | Status: DC
  Administered 2024-04-20 – 2024-04-21 (×2): 3 mL via INTRAVENOUS

## 2024-04-20 MED ORDER — GABAPENTIN 300 MG PO CAPS
300.0000 mg | ORAL_CAPSULE | Freq: Two times a day (BID) | ORAL | Status: DC
  Administered 2024-04-20 – 2024-04-21 (×2): 300 mg via ORAL
  Filled 2024-04-20 (×2): qty 1

## 2024-04-20 MED ORDER — SODIUM CHLORIDE (PF) 0.9 % IJ SOLN
INTRAMUSCULAR | Status: AC
  Filled 2024-04-20: qty 10

## 2024-04-20 MED ORDER — ROCURONIUM BROMIDE 10 MG/ML (PF) SYRINGE
PREFILLED_SYRINGE | INTRAVENOUS | Status: DC | PRN
Start: 2024-04-20 — End: 2024-04-20
  Administered 2024-04-20 (×2): 20 mg via INTRAVENOUS
  Administered 2024-04-20: 80 mg via INTRAVENOUS
  Administered 2024-04-20: 20 mg via INTRAVENOUS

## 2024-04-20 MED ORDER — MENTHOL 3 MG MT LOZG: 1.0000 | LOZENGE | OROMUCOSAL | Status: DC | PRN

## 2024-04-20 MED ORDER — DOCUSATE SODIUM 100 MG PO CAPS
100.0000 mg | ORAL_CAPSULE | Freq: Two times a day (BID) | ORAL | Status: DC
  Administered 2024-04-20 – 2024-04-21 (×2): 100 mg via ORAL
  Filled 2024-04-20 (×2): qty 1

## 2024-04-20 SURGICAL SUPPLY — 76 items
BAG COUNTER SPONGE SURGICOUNT (BAG) ×1 IMPLANT
BENZOIN TINCTURE PRP APPL 2/3 (GAUZE/BANDAGES/DRESSINGS) ×1 IMPLANT
BUR PRECISION FLUTE 5.0 (BURR) ×1 IMPLANT
BUR PRESCISION 1.7 ELITE (BURR) ×1 IMPLANT
BUR ROUND FLUTED 5 RND (BURR) ×1 IMPLANT
CAGE SABLE 10X22 6-12 8D (Cage) IMPLANT
CLSR STERI-STRIP ANTIMIC 1/2X4 (GAUZE/BANDAGES/DRESSINGS) IMPLANT
CNTNR URN SCR LID CUP LEK RST (MISCELLANEOUS) ×1 IMPLANT
CORD BIPOLAR FORCEPS 12FT (ELECTRODE) IMPLANT
COVER BACK TABLE 60X90IN (DRAPES) ×1 IMPLANT
COVER MAYO STAND STRL (DRAPES) ×2 IMPLANT
COVER SURGICAL LIGHT HANDLE (MISCELLANEOUS) ×1 IMPLANT
DISPENSER GRAFT BNE VG (MISCELLANEOUS) IMPLANT
DISPENSER VIVIGEN BONE GRAFT (MISCELLANEOUS) ×1 IMPLANT
DRAPE C-ARM 42X72 X-RAY (DRAPES) ×1 IMPLANT
DRAPE INCISE IOBAN 66X45 STRL (DRAPES) IMPLANT
DRAPE POUCH INSTRU U-SHP 10X18 (DRAPES) ×1 IMPLANT
DRAPE SURG 17X23 STRL (DRAPES) ×4 IMPLANT
DURAPREP 26ML APPLICATOR (WOUND CARE) ×1 IMPLANT
ELECT CAUTERY BLADE 6.4 (BLADE) ×1 IMPLANT
ELECTRODE BLDE 4.0 EZ CLN MEGD (MISCELLANEOUS) ×1 IMPLANT
ELECTRODE REM PT RTRN 9FT ADLT (ELECTROSURGICAL) ×1 IMPLANT
FILTER STRAW FLUID ASPIR (MISCELLANEOUS) ×1 IMPLANT
GAUZE 4X4 16PLY ~~LOC~~+RFID DBL (SPONGE) ×1 IMPLANT
GAUZE SPONGE 4X4 12PLY STRL (GAUZE/BANDAGES/DRESSINGS) ×1 IMPLANT
GLOVE BIO SURGEON STRL SZ 6.5 (GLOVE) ×1 IMPLANT
GLOVE BIO SURGEON STRL SZ8 (GLOVE) ×1 IMPLANT
GLOVE BIOGEL PI IND STRL 7.0 (GLOVE) ×1 IMPLANT
GLOVE BIOGEL PI IND STRL 8 (GLOVE) ×1 IMPLANT
GLOVE SURG ENC MOIS LTX SZ6.5 (GLOVE) ×1 IMPLANT
GOWN STRL REUS W/ TWL LRG LVL3 (GOWN DISPOSABLE) ×2 IMPLANT
GOWN STRL REUS W/ TWL XL LVL3 (GOWN DISPOSABLE) ×1 IMPLANT
GRAFT TISS 40X70 3 THK DERM (Tissue) IMPLANT
IV CATH 14GX2 1/4 (CATHETERS) ×1 IMPLANT
KIT BASIN OR (CUSTOM PROCEDURE TRAY) ×1 IMPLANT
KIT POSITIONER JACKSON TABLE (MISCELLANEOUS) ×1 IMPLANT
KIT TURNOVER KIT B (KITS) ×1 IMPLANT
MARKER SKIN DUAL TIP RULER LAB (MISCELLANEOUS) ×2 IMPLANT
NDL 18GX1X1/2 (RX/OR ONLY) (NEEDLE) ×1 IMPLANT
NDL 22X1.5 STRL (OR ONLY) (MISCELLANEOUS) ×2 IMPLANT
NDL HYPO 25GX1X1/2 BEV (NEEDLE) ×1 IMPLANT
NDL SPNL 18GX3.5 QUINCKE PK (NEEDLE) ×2 IMPLANT
NEEDLE 18GX1X1/2 (RX/OR ONLY) (NEEDLE) ×1 IMPLANT
NEEDLE 22X1.5 STRL (OR ONLY) (MISCELLANEOUS) ×2 IMPLANT
NEEDLE HYPO 25GX1X1/2 BEV (NEEDLE) ×1 IMPLANT
NEEDLE SPNL 18GX3.5 QUINCKE PK (NEEDLE) ×2 IMPLANT
NS IRRIG 1000ML POUR BTL (IV SOLUTION) ×1 IMPLANT
PACK LAMINECTOMY ORTHO (CUSTOM PROCEDURE TRAY) ×1 IMPLANT
PACK UNIVERSAL I (CUSTOM PROCEDURE TRAY) ×1 IMPLANT
PAD ARMBOARD POSITIONER FOAM (MISCELLANEOUS) ×2 IMPLANT
PATTIES SURGICAL .5 X.5 (GAUZE/BANDAGES/DRESSINGS) ×1 IMPLANT
PUTTY DBX 2.5CC DEPUY (Putty) IMPLANT
ROD PRE BENT EXP 40MM (Rod) IMPLANT
SCREW CORT FIX FEN 5.5X6X30MM (Screw) IMPLANT
SCREW CORT FIX FEN 5.5X6X35MM (Screw) IMPLANT
SCREW SET SINGLE INNER (Screw) IMPLANT
SPONGE INTESTINAL PEANUT (DISPOSABLE) ×1 IMPLANT
SPONGE SURGIFOAM ABS GEL 100 (HEMOSTASIS) ×1 IMPLANT
STRIP CLOSURE SKIN 1/2X4 (GAUZE/BANDAGES/DRESSINGS) ×2 IMPLANT
SUT MNCRL AB 4-0 PS2 18 (SUTURE) ×1 IMPLANT
SUT VIC AB 0 CT1 18XCR BRD 8 (SUTURE) ×1 IMPLANT
SUT VIC AB 1 CT1 18XCR BRD 8 (SUTURE) ×1 IMPLANT
SUT VIC AB 2-0 CT2 18 VCP726D (SUTURE) ×1 IMPLANT
SYR 20ML LL LF (SYRINGE) ×2 IMPLANT
SYR BULB IRRIG 60ML STRL (SYRINGE) ×1 IMPLANT
SYR CONTROL 10ML LL (SYRINGE) ×2 IMPLANT
SYR TB 1ML LUER SLIP (SYRINGE) ×1 IMPLANT
TAP CANN VIPER2 DL 5.0 (TAP) IMPLANT
TAP CANN VIPER2 DL 6.0 (TAP) IMPLANT
TAP VIPER MIS 4.35MM (TAP) IMPLANT
TAPE CLOTH SURG 4X10 WHT LF (GAUZE/BANDAGES/DRESSINGS) IMPLANT
TISSUE ARTHOFLEX THICK 3MM (Tissue) ×2 IMPLANT
TRAY FOLEY MTR SLVR 16FR STAT (SET/KITS/TRAYS/PACK) ×1 IMPLANT
TUBE FUNNEL GL DISP (ORTHOPEDIC DISPOSABLE SUPPLIES) IMPLANT
WATER STERILE IRR 1000ML POUR (IV SOLUTION) ×1 IMPLANT
YANKAUER SUCT BULB TIP NO VENT (SUCTIONS) ×1 IMPLANT

## 2024-04-20 NOTE — Transfer of Care (Signed)
 Immediate Anesthesia Transfer of Care Note  Patient: Mia [name]  Procedure(s) Performed: TRANSFORAMINAL LUMBAR INTERBODY FUSION (TLIF) WITH PEDICLE SCREW FIXATION 1 LEVEL (Left)  Patient Location: PACU  Anesthesia Type:General  Level of Consciousness: oriented and patient cooperative  Airway & Oxygen Therapy: Patient Spontanous Breathing and Patient connected to nasal cannula oxygen  Post-op Assessment: Report given to RN, Post -op Vital signs reviewed and stable, Patient moving all extremities X 4, and Patient able to stick tongue midline  Post vital signs: Reviewed and stable  Last Vitals:  Vitals Value Taken Time  BP 108/78   Temp 98.4   Pulse 75 04/20/24 11:15  Resp 15 04/20/24 11:15  SpO2 96 % 04/20/24 11:15  Vitals shown include unfiled device data.  Last Pain:  Vitals:   04/20/24 0625  TempSrc:   PainSc: 9       Patients Stated Pain Goal: 3 (04/20/24 9374)  Complications: No notable events documented.

## 2024-04-20 NOTE — H&P (Signed)
 PREOPERATIVE H&P  Chief Complaint: Left leg pain  HPI: Mia [name] is a 73 y.o. female who presents with ongoing pain in the left leg  MRI reveals spinal stenosis and instability at L4/5   Patient has failed multiple forms of conservative care and continues to have pain (see office notes for additional details regarding the patient's full course of treatment)  Past Medical History:  Diagnosis Date   Anal fissure    Anemia    Arthritis    Blood transfusion    C. difficile colitis    Chest pain    a. 01/2013 MV: EF 59%, no ischemia.   Chronic Dyspnea    a. 01/2013 Echo: EF 60-65%, Gr 1 DD, PASP 42mmHg.// Echo 01/2020: EF 60-65, no RWMA, GR 1 DD, GLS -21.6%, normal RV SF, trivial MR    COVID-19    DDD (degenerative disc disease), cervical    DDD (degenerative disc disease), lumbar    DVT (deep venous thrombosis) (HCC) 06/23/2023   left lower extremity   Fibromyalgia    Gall stones    GERD (gastroesophageal reflux disease)    gastritis   Hemorrhoids    Hypertension    Interstitial cystitis    MCI (mild cognitive impairment) 11/25/2017   Memory difficulty 12/14/2013   Neuromuscular disorder (HCC)    sclerosis   Osteoporosis    Peptic ulcer    PONV (postoperative nausea and vomiting)    Pseudogout    Raynaud phenomenon    Rectal bleeding    Sleep apnea    a. on cpap.   Stress fracture of left tibia 11/05/2016   SVT (supraventricular tachycardia) (HCC)    Syncope 09/10/2015   Thyroid  disease    hypothyroidism   Tremor, essential 05/14/2016   Past Surgical History:  Procedure Laterality Date   APPENDECTOMY     BLADDER SURGERY     x2   BLADDER SURGERY     BREAST BIOPSY     CARDIAC CATHETERIZATION     CARPAL TUNNEL RELEASE     CATARACT EXTRACTION Bilateral    CHOLECYSTECTOMY     DILATION AND CURETTAGE OF UTERUS     ENTEROCELE REPAIR     x2   EYE SURGERY     retina   herniated disc  11/25/2021   L4 and L5   hysterectomy - unknown type     JOINT  REPLACEMENT     KNEE ARTHROPLASTY     KNEE SURGERY     x6   NECK SURGERY     fusion   OOPHORECTOMY     QUADRICEPS REPAIR Right    RECTOCELE REPAIR     x2   SHOULDER ARTHROSCOPY WITH SUBACROMIAL DECOMPRESSION, ROTATOR CUFF REPAIR AND BICEP TENDON REPAIR  10/06/2012   Procedure: SHOULDER ARTHROSCOPY WITH SUBACROMIAL DECOMPRESSION, ROTATOR CUFF REPAIR AND BICEP TENDON REPAIR;  Surgeon: Eva Elsie Herring, MD;  [facility]:  [facility];  Service: Orthopedics;  Laterality: Right;  Arthroscopic  Repair  of  Subscapularis, Open Biceps Tenodesis   SHOULDER SURGERY     bilateral- bones spur   SHOULDER SURGERY Left 04/15/2021   TONSILLECTOMY     TOTAL KNEE ARTHROPLASTY     TOTAL SHOULDER ARTHROPLASTY     Social History   Socioeconomic History   Marital status: Married    Spouse name: Norleen Kemps   Number of children: 2   Years of education: 14   Highest education level: Associate degree: academic program  Occupational  History   Occupation: Retired  Tobacco Use   Smoking status: Never    Passive exposure: Never   Smokeless tobacco: Never  Vaping Use   Vaping status: Never Used  Substance and Sexual Activity   Alcohol use: No    Alcohol/week: 0.0 standard drinks of alcohol   Drug use: No   Sexual activity: Not on file  Other Topics Concern   Not on file  Social History Narrative   Lives with her husband. Her grand daughter is living with them.  She likes to hang out with her friends and play games and do bible study.   Social Drivers of Corporate investment banker Strain: Low Risk  (05/25/2023)   Overall Financial Resource Strain (CARDIA)    Difficulty of Paying Living Expenses: Not hard at all  Food Insecurity: No Food Insecurity (02/09/2024)   Hunger Vital Sign    Worried About Running Out of Food in the Last Year: Never true    Ran Out of Food in the Last Year: Never true  Transportation Needs: No Transportation Needs (02/09/2024)   PRAPARE - Therapist, art (Medical): No    Lack of Transportation (Non-Medical): No  Physical Activity: Inactive (02/09/2024)   Exercise Vital Sign    Days of Exercise per Week: 0 days    Minutes of Exercise per Session: 0 min  Stress: Stress Concern Present (02/09/2024)   [employer] of Occupational Health - Occupational Stress Questionnaire    Feeling of Stress : To some extent  Social Connections: Socially Integrated (05/25/2023)   Social Connection and Isolation Panel    Frequency of Communication with Friends and Family: More than three times a week    Frequency of Social Gatherings with Friends and Family: More than three times a week    Attends Religious Services: More than 4 times per year    Active Member of [name] or Organizations: Yes    Attends Engineer, structural: More than 4 times per year    Marital Status: Married   Family History  Problem Relation Age of Onset   Heart attack Mother    Stroke Mother    Diabetes Mother    Heart disease Mother    Colon polyps Mother    Asthma Mother    Dementia Mother    Alzheimer's disease Mother    Heart disease Father    Heart attack Father    Asthma Father    Stroke Sister    Hypertension Sister    Rheum arthritis Sister    Dementia Sister    Asthma Sister    Parkinson's disease Sister    Lupus Sister    Asthma Sister    Breast cancer Maternal Grandmother    Wilson's disease Maternal Grandmother    Pancreatic cancer Maternal Grandfather    Autoimmune disease Daughter        in eyes   Arthritis Daughter    Deep vein thrombosis Daughter    Allergies  Allergen Reactions   Codeine Nausea And Vomiting    Other Reaction(s): UNKNOWN   Myrbetriq  [Mirabegron] Other (See Comments)    SEVERE HEADACHE   Nystatin Other (See Comments)    Unknown reaction   Percocet [Oxycodone-Acetaminophen ] Nausea And Vomiting   Propoxyphene Nausea And Vomiting    Other Reaction(s): UNKNOWN   Erythromycin Base     Other  Reaction(s): UNKNOWN   Ketorolac      Other Reaction(s): UNKNOWN   Oxycodone-Acetaminophen   Nausea Only   Celecoxib Other (See Comments)    Headache/flushed     Darvocet [Propoxyphene N-Acetaminophen ] Nausea And Vomiting   Erythromycin Nausea And Vomiting    Nausea and vomiting    Hydrocodone  Nausea And Vomiting   Nitrofurantoin Nausea And Vomiting    Other Reaction(s): UNKNOWN   Pregabalin  Swelling    Legs swelling     Toradol  [Ketorolac  Tromethamine ] Nausea And Vomiting    Per patient, only PO form causes nausea and vomiting. Can take injection without issue   Tramadol Nausea And Vomiting   Verapamil Nausea And Vomiting   Prior to Admission medications   Medication Sig Start Date End Date Taking? Authorizing Provider  acetaminophen  (TYLENOL ) 500 MG tablet Take 1,000 mg by mouth every 6 (six) hours as needed for moderate pain (pain score 4-6) (pain.).   Yes [provider]  amLODipine  (NORVASC ) 5 MG tablet Take 1.5 tablets (7.5 mg total) by mouth daily. 04/17/24  Yes Cleaver, Josefa HERO, NP  colestipol (COLESTID) 1 g tablet Take 1 g by mouth daily at 12 noon.   Yes [provider]  cyanocobalamin  (VITAMIN B12) 1000 MCG/ML injection Inject 1,000 mcg into the muscle every 28 (twenty-eight) days.   Yes [provider]  donepezil  (ARICEPT ) 10 MG tablet TAKE 1 TABLET IN THE EVENING AT BEDTIME 02/22/24  Yes Millikan, Megan, NP  gabapentin  (NEURONTIN ) 300 MG capsule TAKE 1 CAPSULE BY MOUTH EVERY MORNING AND AT BEDTIME Patient taking differently: Take 300 mg by mouth 2 (two) times daily. 12/03/23  Yes Alvan Dorothyann BIRCH, MD  ipratropium (ATROVENT ) 0.06 % nasal spray Place 2 sprays into both nostrils 2 (two) times daily as needed for rhinitis.   Yes [provider]  leflunomide  (ARAVA ) 10 MG tablet Take 10 mg po every other day alternating with 20 mg po every other day 02/14/24  Yes Cheryl Waddell HERO, PA-C  meclizine  (ANTIVERT ) 25 MG tablet Take 1 tablet (25 mg  total) by mouth 3 (three) times daily as needed for dizziness. 02/14/24  Yes Alvan Dorothyann BIRCH, MD  Melatonin 10 MG CAPS Take 10 mg by mouth at bedtime.   Yes [provider]  memantine  (NAMENDA ) 10 MG tablet TAKE 1 TABLET BY MOUTH TWO TIMES A DAY MUST SEE DR FOR FURTHER REFILLS Patient taking differently: Take 10 mg by mouth 2 (two) times daily. 12/15/23  Yes Millikan, Megan, NP  metoprolol  tartrate (LOPRESSOR ) 100 MG tablet Take 1 tablet (100 mg total) by mouth 2 (two) times daily. 01/17/24  Yes Conte, Tessa N, PA-C  pantoprazole  (PROTONIX ) 20 MG tablet Take 20 mg by mouth 2 (two) times daily. 10/30/22  Yes [provider]  primidone  (MYSOLINE ) 50 MG tablet TAKE 1 TABLET BY MOUTH EVERYDAY AT BEDTIME 07/01/23  Yes Buck Saucer, MD  rosuvastatin  (CRESTOR ) 5 MG tablet Take 1 tablet (5 mg total) by mouth daily. 01/17/24  Yes Conte, Tessa N, PA-C  sodium chloride  (MURO 128) 5 % ophthalmic solution Place 1 drop into both eyes in the morning, at noon, in the evening, and at bedtime.   Yes [provider]  sucralfate  (CARAFATE ) 1 g tablet Take 1 g by mouth 4 (four) times daily as needed (GI burninh). 12/30/23  Yes [provider]  valsartan  (DIOVAN ) 160 MG tablet Take 1 tablet (160 mg total) by mouth 2 (two) times daily. 01/17/24  Yes Conte, Tessa N, PA-C  vancomycin (VANCOCIN) 125 MG capsule Take 125 mg by mouth as directed. PER PT TO TAKE BEFORE  HER SURGERY. 04/13/24 04/23/24 Yes [provider]  venlafaxine  XR (EFFEXOR -XR) 75 MG 24 hr capsule TAKE 1 CAPSULE DAILY WITH BREAKFAST 02/14/24  Yes Millikan, Megan, NP  albuterol  (VENTOLIN  HFA) 108 (90 Base) MCG/ACT inhaler Inhale 1-2 puffs into the lungs every 6 (six) hours as needed for shortness of breath or wheezing. 10/17/21   [provider]  AMBULATORY NON FORMULARY MEDICATION Medication Name: CPAP with humidifier set to 5 to 20 cm of water pressure.  Fullface mask.  Please fax download back to [phone] 3:06  weeks. 11/18/23   Alvan Dorothyann BIRCH, MD  amoxicillin  (AMOXIL ) 500 MG capsule Take 2,000 mg by mouth as needed (BEFORE DENTAL PROCEDURES).    [provider]     All other systems have been reviewed and were otherwise negative with the exception of those mentioned in the HPI and as above.  Physical Exam: Vitals:   04/20/24 0610  BP: 124/65  Pulse: (!) 57  Resp: 17  Temp: 98.6 F (37 C)  SpO2: 95%    Body mass index is 26.31 kg/m.  General: Alert, no acute distress Cardiovascular: No pedal edema Respiratory: No cyanosis, no use of accessory musculature Skin: No lesions in the area of chief complaint Neurologic: Sensation intact distally Psychiatric: Patient is competent for consent with normal mood and affect Lymphatic: No axillary or cervical lymphadenopathy   Assessment/Plan: SPINAL STENOSIS, INSTABILITY AT L4/5 Plan for Procedure(s): TRANSFORAMINAL LUMBAR INTERBODY FUSION (TLIF) WITH PEDICLE SCREW FIXATION 1 LEVEL L4/5   Oneil LITTIE Priestly, MD 04/20/2024 6:39 AM

## 2024-04-20 NOTE — Anesthesia Procedure Notes (Signed)
 Procedure Name: Intubation Date/Time: 04/20/2024 7:49 AM  Performed by: Viviana Almarie DASEN, CRNAPre-anesthesia Checklist: Patient identified, Emergency Drugs available, Suction available and Patient being monitored Patient Re-evaluated:Patient Re-evaluated prior to induction Oxygen Delivery Method: Circle system utilized Preoxygenation: Pre-oxygenation with 100% oxygen Induction Type: IV induction Ventilation: Mask ventilation without difficulty Laryngoscope Size: Glidescope and 3 Grade View: Grade I Tube type: Oral Tube size: 7.0 mm Number of attempts: 1 Airway Equipment and Method: Stylet, Oral airway and Bite block Placement Confirmation: ETT inserted through vocal cords under direct vision, positive ETCO2 and breath sounds checked- equal and bilateral Secured at: 21 cm Tube secured with: Tape Dental Injury: Teeth and Oropharynx as per pre-operative assessment

## 2024-04-20 NOTE — Op Note (Signed)
 PATIENT NAME: Mia [name]   MEDICAL RECORD NO.:   [id number]   DATE OF BIRTH: 09-01-1951   DATE OF PROCEDURE: 04/20/2024                               OPERATIVE REPORT     PREOPERATIVE DIAGNOSES: 1. Left-sided lumbar radiculopathy 2. L4-5 spinal stenosis 3. L4-5 spondylolisthesis   POSTOPERATIVE DIAGNOSES: 1. Left-sided lumbar radiculopathy 2. L4-5 spinal stenosis 3. L4-5 spondylolisthesis   PROCEDURES: 1. L4/5 decompression, including removal of bilateral facet hypertrophy, and removal of left-sided L4-5 disc herniation 2. Left-sided L4-5 transforaminal lumbar interbody fusion. 3. Right-sided L4-5 posterolateral fusion. 4. Insertion of interbody device x1 (Globus expandable intervertebral spacer). 5. Placement of segmental posterior instrumentation L4, L5 bilaterally  6. Use of local autograft. 7. Use of morselized allograft - Vivigen 8. Intraoperative use of fluoroscopy.   SURGEON:  Oneil Priestly, MD.   ASSISTANTBETHA Ileana Clara, PA-C.   ANESTHESIA:  General endotracheal anesthesia.   COMPLICATIONS:  None.   DISPOSITION:  Stable.   ESTIMATED BLOOD LOSS:  100cc   INDICATIONS FOR SURGERY:  Briefly,  Mia [name] is a pleasant 73 year old female, who did present to me with severe and ongoing pain in the left leg. I did feel that the symptoms were secondary to the findings noted above.  She has had symptoms for well over a year.  We did previously planned surgery, but this had to be delayed as she was diagnosed with a DVT.  The patient failed conservative care and did wish to proceed with the procedure noted above.    OPERATIVE DETAILS:  On 04/20/2024, the patient was brought to surgery and general endotracheal anesthesia was administered.  The patient was placed prone on a well-padded flat Jackson bed with a spinal frame.  Antibiotics were given and a time-out procedure was performed. The back was prepped and draped in the usual fashion.  A midline incision was made overlying  the L4-5 intervertebral spaces.  The fascia was incised at the midline.  The paraspinal musculature was bluntly swept laterally.  Anatomic landmarks for the pedicles were exposed. Using fluoroscopy, I did cannulate the L4 and L5 pedicles bilaterally, using a medial to lateral cortical trajectory technique.  At this point, 6 x 30 mm screws were placed into the right pedicles, and a 40 mm rod was placed into the tulip heads of the screw, and caps were also placed.  Distraction was then applied across the L4-5 intervertebral space, and the caps were then provisionally tightened.  On the left side, bone wax was placed into the cannulated pedicle holes.  I then proceeded with the decompressive aspect of the procedure at the L4-5 level.  A partial facetectomy was performed bilaterally at L4-5, decompressing the L4-5 intervertebral space.  A thorough left-sided neuroforaminal decompression was performed at L4-5 as well.  At this point, the dura and traversing left L5 nerve was medially retracted, and a very prominent left-sided L4-5 disc herniation was encountered, occupying the lateral recess.  This did migrate superiorly, behind the L4 vertebral body.  The disc fragments were removed in their entirety, entirely decompressing the traversing left L5 nerve. I was very pleased with the decompression. With an assistant holding medial retraction of the traversing left L5 nerve, I did perform an annulotomy at the posterolateral aspect of the L4-5 intervertebral space.  I then used a series of curettes and pituitary rongeurs to perform a thorough  and complete intervertebral diskectomy.  The intervertebral space was then liberally packed with autograft as well as allograft in the form of Vivigen, as was the appropriate-sized intervertebral spacer.  The spacer was then tamped into position in the usual fashion, and expanded to 8.5 mm in height. I was very pleased with the press-fit of the spacer.  I then placed 6 mm  screws on the right at L4 and L5. A 40-mm rod was then placed and caps were placed. The distraction was then released on the contralateral side.  All caps were then locked.  The wound was copiously irrigated with a total of approximately 3 L prior to placing the bone graft.    At this point, I did turned my attention to the posterolateral fusion on the right side.  A high-speed bur was used to decorticate the right L4-5 facet joint, and the right L4 and L5 transverse processes.  Autograft in the decompression, in addition to allograft, was packed into the posterolateral gutter and region of the right L4-5 facet joint, in order to accomplish a fusion across L4-5 on the right.  The wound was explored for any undue bleeding and there was no substantial bleeding encountered. Gel-Foam was placed over the laminectomy site.  The wound was then closed in layers using #1 Vicryl followed by 2-0 Vicryl, followed by 4-0 Monocryl.  Benzoin and Steri-Strips were applied followed by sterile dressing.     Of note, Ileana Clara was my assistant throughout surgery, and did aid in retraction, suctioning, the decompression, placement of the hardware, and closure.     Oneil Priestly, MD

## 2024-04-21 ENCOUNTER — Telehealth: Payer: Self-pay | Admitting: Family Medicine

## 2024-04-21 ENCOUNTER — Encounter (HOSPITAL_COMMUNITY): Payer: Self-pay | Admitting: Orthopedic Surgery

## 2024-04-21 DIAGNOSIS — I10 Essential (primary) hypertension: Secondary | ICD-10-CM | POA: Diagnosis not present

## 2024-04-21 DIAGNOSIS — M5416 Radiculopathy, lumbar region: Secondary | ICD-10-CM | POA: Diagnosis not present

## 2024-04-21 DIAGNOSIS — Z86718 Personal history of other venous thrombosis and embolism: Secondary | ICD-10-CM | POA: Diagnosis not present

## 2024-04-21 DIAGNOSIS — Z8616 Personal history of COVID-19: Secondary | ICD-10-CM | POA: Diagnosis not present

## 2024-04-21 DIAGNOSIS — M48061 Spinal stenosis, lumbar region without neurogenic claudication: Secondary | ICD-10-CM | POA: Diagnosis not present

## 2024-04-21 DIAGNOSIS — M4316 Spondylolisthesis, lumbar region: Secondary | ICD-10-CM | POA: Diagnosis not present

## 2024-04-21 MED ORDER — METHOCARBAMOL 500 MG PO TABS: 500.0000 mg | ORAL_TABLET | Freq: Four times a day (QID) | ORAL | 0 refills | Status: DC | PRN

## 2024-04-21 MED ORDER — HYDROMORPHONE HCL 2 MG PO TABS: 1.0000 mg | ORAL_TABLET | ORAL | 0 refills | Status: DC | PRN

## 2024-04-21 MED ORDER — LEFLUNOMIDE 10 MG PO TABS
10.0000 mg | ORAL_TABLET | ORAL | Status: DC
  Administered 2024-04-21: 10 mg via ORAL
  Filled 2024-04-21: qty 1

## 2024-04-21 MED ORDER — LEFLUNOMIDE 10 MG PO TABS: 20.0000 mg | ORAL_TABLET | ORAL | Status: DC

## 2024-04-21 NOTE — Evaluation (Signed)
 Physical Therapy Evaluation  Patient Details Name: Mia [name] MRN: [id number] DOB: 1951/04/14 Today's Date: 04/21/2024  History of Present Illness  Pt is a 73 yo female who presents to [facility] on 04/20/24 with spinal stenosis of L4/L5, s/p elective L4/L5 decompression and fusion. PMH of DDD, DVT, Fibromylagia, HTN, mild cognitive impairment, Sclerosis, osteoporosis, PONV, Raynaud phenomenon.   Clinical Impression  Pt admitted with above diagnosis. At the time of PT eval, pt was able to demonstrate transfers and ambulation with gross min assist to CGA and RW for support. Pt was educated on precautions, brace application/wearing schedule, appropriate activity progression, and car transfer. Pt currently with functional limitations due to the deficits listed below (see PT Problem List). Pt will benefit from skilled PT to increase their independence and safety with mobility to allow discharge to the venue listed below.          If plan is discharge home, recommend the following: A little help with walking and/or transfers;A little help with bathing/dressing/bathroom;Assistance with cooking/housework;Assist for transportation;Help with stairs or ramp for entrance   Can travel by private vehicle        Equipment Recommendations None recommended by PT  Recommendations for Other Services       Functional Status Assessment Patient has had a recent decline in their functional status and demonstrates the ability to make significant improvements in function in a reasonable and predictable amount of time.     Precautions / Restrictions Precautions Precautions: Back Precaution Booklet Issued: Yes (comment) Recall of Precautions/Restrictions: Intact Precaution/Restrictions Comments: Reviewed handout and pt was cued for precautions during functional mobility. Required Braces or Orthoses: Spinal Brace Spinal Brace: Applied in sitting position;Thoracolumbosacral orthotic Restrictions Weight Bearing  Restrictions Per Provider Order: No      Mobility  Bed Mobility Overal bed mobility: Needs Assistance Bed Mobility: Rolling, Sidelying to Sit Rolling: Supervision Sidelying to sit: Supervision       General bed mobility comments: VC's for log roll technique. No assist required but increased time taken    Transfers Overall transfer level: Needs assistance Equipment used: Rolling walker (2 wheels) Transfers: Sit to/from Stand Sit to Stand: Contact guard assist           General transfer comment: VC's for hand placement on seated surface for safety. No assist required but hands on guarding provided for safety.    Ambulation/Gait Ambulation/Gait assistance: Contact guard assist, Min assist Gait Distance (Feet): 200 Feet Assistive device: Rolling walker (2 wheels) Gait Pattern/deviations: Step-through pattern, Decreased stride length, Trunk flexed Gait velocity: Decreased Gait velocity interpretation: <1.31 ft/sec, indicative of household ambulator   General Gait Details: VC's for improved posture, closer walker proximity and forward gaze. Pt occasionally unsteady and requiring light assist to recover.  Stairs            Wheelchair Mobility     Tilt Bed    Modified Rankin (Stroke Patients Only)       Balance Overall balance assessment: Needs assistance Sitting-balance support: Feet supported, No upper extremity supported Sitting balance-Leahy Scale: Fair       Standing balance-Leahy Scale: Fair Standing balance comment: static standing; poor when dynamic                             Pertinent Vitals/Pain Pain Assessment Pain Assessment: Faces Faces Pain Scale: Hurts little more Pain Location: back, op site Pain Descriptors / Indicators: Aching Pain Intervention(s): Limited activity within patient's tolerance,  Monitored during session, Repositioned    Home Living Family/patient expects to be discharged to:: Private residence Living  Arrangements: Spouse/significant other;Other relatives Available Help at Discharge: Family;Available 24 hours/day Type of Home: House Home Access: Ramped entrance       Home Layout: One level Home Equipment: BSC/3in1;Shower seat Additional Comments: BSC to go over toilet.    Prior Function Prior Level of Function : Independent/Modified Independent             Mobility Comments: Occasional use of RW due to baseline vertigo/unsteadiness ADLs Comments: Independent     Extremity/Trunk Assessment   Upper Extremity Assessment Upper Extremity Assessment: Overall WFL for tasks assessed    Lower Extremity Assessment Lower Extremity Assessment: Generalized weakness    Cervical / Trunk Assessment Cervical / Trunk Assessment: Back Surgery  Communication   Communication Communication: No apparent difficulties    Cognition Arousal: Alert Behavior During Therapy: WFL for tasks assessed/performed   PT - Cognitive impairments: History of cognitive impairments                         Following commands: Intact       Cueing Cueing Techniques: Verbal cues     General Comments General comments (skin integrity, edema, etc.): Pt spouse present at end of sessions.    Exercises     Assessment/Plan    PT Assessment Patient needs continued PT services  PT Problem List Decreased strength;Decreased activity tolerance;Decreased balance;Decreased mobility;Decreased knowledge of use of DME;Decreased safety awareness;Decreased knowledge of precautions;Pain       PT Treatment Interventions DME instruction;Gait training;Stair training;Functional mobility training;Therapeutic activities;Therapeutic exercise;Balance training;Patient/family education    PT Goals (Current goals can be found in the Care Plan section)  Acute Rehab PT Goals Patient Stated Goal: Home today PT Goal Formulation: With patient Time For Goal Achievement: 04/28/24 Potential to Achieve Goals: Good     Frequency Min 5X/week     Co-evaluation               AM-PAC PT 6 Clicks Mobility  Outcome Measure Help needed turning from your back to your side while in a flat bed without using bedrails?: A Little Help needed moving from lying on your back to sitting on the side of a flat bed without using bedrails?: A Little Help needed moving to and from a bed to a chair (including a wheelchair)?: A Little Help needed standing up from a chair using your arms (e.g., wheelchair or bedside chair)?: A Little Help needed to walk in hospital room?: A Little Help needed climbing 3-5 steps with a railing? : A Little 6 Click Score: 18    End of Session Equipment Utilized During Treatment: Gait belt;Back brace Activity Tolerance: Patient tolerated treatment well Patient left: in chair;with call bell/phone within reach Nurse Communication: Mobility status PT Visit Diagnosis: Unsteadiness on feet (R26.81);Pain Pain - part of body:  (back)    Time: 9091-9062 PT Time Calculation (min) (ACUTE ONLY): 29 min   Charges:   PT Evaluation $PT Eval Low Complexity: 1 Low PT Treatments $Gait Training: 8-22 mins PT General Charges $$ ACUTE PT VISIT: 1 Visit         Leita Sable, PT, DPT Acute Rehabilitation Services Secure Chat Preferred Office: [phone]   Leita JONETTA Sable 04/21/2024, 12:01 PM

## 2024-04-21 NOTE — TOC Transition Note (Signed)
 Transition of Care [facility]) - Discharge Note   Patient Details  Name: Mia [name] MRN: [id number] Date of Birth: 1951-09-23  [facility] CM/SW Contact:  Rosalva Jon Bloch, RN Phone Number: 04/21/2024, 12:22 PM   Clinical Narrative:    Patient will DC to: home Anticipated DC date: 04/21/2024 Family notified: yes Transport by: car      - s/p elective L4/L5 decompression and fusion 6/26 Per MD patient ready for DC today. RN, patient, patient's  husband notified of DC.   Orders noted for home health services. Pt agreeable. Referral made with [facility] and accepted. Pt without DME needs. Husband to provide transportation to home. Post hospital f/u noted on AVS.   RNCM will sign off for now as intervention is no longer needed. Please consult us  again if new needs arise.    Final next level of care: Home w Home Health Services     Patient Goals and CMS Choice     Choice offered to / list presented to : Patient      Discharge Placement                       Discharge Plan and Services Additional resources added to the After Visit Summary for                            [facility] Arranged: PT, OT [facility] Agency: [facility] Date [facility] Agency Contacted: 04/21/24 Time HH Agency Contacted: 1221 Representative spoke with at [facility] Agency: [place]  Social Drivers of Health (SDOH) Interventions SDOH Screenings   Food Insecurity: No Food Insecurity (02/09/2024)  Housing: Low Risk  (02/09/2024)  Transportation Needs: No Transportation Needs (02/09/2024)  Utilities: Not At Risk (02/09/2024)  Alcohol Screen: Low Risk  (05/25/2023)  Depression (PHQ2-9): Low Risk  (02/09/2024)  Financial Resource Strain: Low Risk  (05/25/2023)  Physical Activity: Inactive (02/09/2024)  Social Connections: Socially Integrated (05/25/2023)  Stress: Stress Concern Present (02/09/2024)  Tobacco Use: Low Risk  (04/20/2024)  Health Literacy: Adequate Health Literacy (05/25/2023)      Readmission Risk Interventions     No data to display

## 2024-04-21 NOTE — Anesthesia Postprocedure Evaluation (Signed)
 Anesthesia Post Note  Patient: Mia [name]  Procedure(s) Performed: TRANSFORAMINAL LUMBAR INTERBODY FUSION (TLIF) WITH PEDICLE SCREW FIXATION 1 LEVEL (Left)     Patient location during evaluation: PACU Anesthesia Type: General Level of consciousness: awake and alert Pain management: pain level controlled Vital Signs Assessment: post-procedure vital signs reviewed and stable Respiratory status: spontaneous breathing, nonlabored ventilation and respiratory function stable Cardiovascular status: blood pressure returned to baseline and stable Postop Assessment: no apparent nausea or vomiting Anesthetic complications: no   No notable events documented.  Last Vitals:  Vitals:   04/21/24 0353 04/21/24 0729  BP: 125/62 (!) 130/59  Pulse: (!) 56 (!) 56  Resp: 18 19  Temp: 36.4 C 36.8 C  SpO2: 95% 99%    Last Pain:  Vitals:   04/21/24 0729  TempSrc: Oral  PainSc:                  Butler Levander Pinal

## 2024-04-21 NOTE — Telephone Encounter (Signed)
 Please call her CPAP supplier and let them know that we need to set her CPAP to 12 cm of water pressure and then have them do a download after 2 weeks.  Also let patient know that right now she is set on AutoPap.  I just recently went to a lecture about CPAP and they are strongly recommending not using AutoPap anymore because it does not seem to be as effective in reducing long-term complications from sleep apnea.  So organ to call her CPAP company and set her to 12 and then do a download in a couple of weeks to see if it is adequate.  I think she will actually feel better as well.

## 2024-04-21 NOTE — Progress Notes (Signed)
 Patient alert and oriented, mae's well, voiding adequate amount of urine, swallowing without difficulty, no c/o pain at time of discharge. Patient discharged home with family. Script and discharged instructions given to patient. Patient and family stated understanding of instructions given. Room was checked and accounted for all patient's belongings; discharge instructions concerning her medications, incision care, follow up appointment and when to call the doctor as needed were all discussed with patient by RN and she expressed understanding on the instructions given.

## 2024-04-21 NOTE — Progress Notes (Signed)
 Physical Therapy Treatment  Patient Details Name: Mia [name] MRN: [id number] DOB: 01-Dec-1950 Today's Date: 04/21/2024   History of Present Illness Pt is a 73 yo female who presents to [facility] on 04/20/24 with spinal stenosis of L4/L5, s/p elective L4/L5 decompression and fusion. PMH of DDD, DVT, Fibromylagia, HTN, mild cognitive impairment, Sclerosis, osteoporosis, PONV, Raynaud phenomenon.    PT Comments  Received call in office from pt requesting PT return to answer husband's questions who is now present. PT reviewed precautions, brace application/wearing schedule and appropriate activity progression with pt and husband. Brace adjusted for optimal fit and comfort and spouse was educated on assisting pt to don. PT answered all questions during this time. Will continue to follow.     If plan is discharge home, recommend the following: A little help with walking and/or transfers;A little help with bathing/dressing/bathroom;Assistance with cooking/housework;Assist for transportation;Help with stairs or ramp for entrance   Can travel by private vehicle        Equipment Recommendations  None recommended by PT    Recommendations for Other Services       Precautions / Restrictions Precautions Precautions: Back Precaution Booklet Issued: Yes (comment) Recall of Precautions/Restrictions: Intact Precaution/Restrictions Comments: Reviewed handout and pt was cued for precautions during functional mobility. Required Braces or Orthoses: Spinal Brace Spinal Brace: Applied in sitting position;Thoracolumbosacral orthotic Restrictions Weight Bearing Restrictions Per Provider Order: No     Mobility  Bed Mobility Overal bed mobility: Needs Assistance Bed Mobility: Rolling, Sidelying to Sit Rolling: Supervision Sidelying to sit: Supervision       General bed mobility comments: Pt received in chair    Transfers Overall transfer level: Needs assistance Equipment used: Rolling walker (2  wheels) Transfers: Sit to/from Stand Sit to Stand: Contact guard assist           General transfer comment: VC's for hand placement on seated surface for safety. No assist required but hands on guarding provided for safety.    Ambulation/Gait Ambulation/Gait assistance: Contact guard assist, Min assist Gait Distance (Feet): 200 Feet Assistive device: Rolling walker (2 wheels) Gait Pattern/deviations: Step-through pattern, Decreased stride length, Trunk flexed Gait velocity: Decreased Gait velocity interpretation: <1.31 ft/sec, indicative of household ambulator   General Gait Details: Not tested this session   Stairs             Wheelchair Mobility     Tilt Bed    Modified Rankin (Stroke Patients Only)       Balance Overall balance assessment: Needs assistance Sitting-balance support: Feet supported, No upper extremity supported Sitting balance-Leahy Scale: Fair Sitting balance - Comments: in recliner.     Standing balance-Leahy Scale: Fair Standing balance comment: static standing; poor when dynamic                            Communication Communication Communication: No apparent difficulties  Cognition Arousal: Alert Behavior During Therapy: WFL for tasks assessed/performed   PT - Cognitive impairments: History of cognitive impairments                         Following commands: Intact      Cueing Cueing Techniques: Verbal cues  Exercises      General Comments General comments (skin integrity, edema, etc.): Pt spouse present at end of sessions.      Pertinent Vitals/Pain Pain Assessment Pain Assessment: Faces Faces Pain Scale: Hurts little more Pain Location: back,  op site Pain Descriptors / Indicators: Aching Pain Intervention(s): Limited activity within patient's tolerance, Monitored during session, Repositioned    Home Living Family/patient expects to be discharged to:: Private residence Living Arrangements:  Spouse/significant other;Other relatives Available Help at Discharge: Family;Available 24 hours/day Type of Home: House Home Access: Ramped entrance       Home Layout: One level Home Equipment: BSC/3in1;Shower seat Additional Comments: BSC to go over toilet.    Prior Function            PT Goals (current goals can now be found in the care plan section) Acute Rehab PT Goals Patient Stated Goal: Home today PT Goal Formulation: With patient Time For Goal Achievement: 04/28/24 Potential to Achieve Goals: Good Progress towards PT goals: Progressing toward goals    Frequency    Min 5X/week      PT Plan      Co-evaluation              AM-PAC PT 6 Clicks Mobility   Outcome Measure  Help needed turning from your back to your side while in a flat bed without using bedrails?: A Little Help needed moving from lying on your back to sitting on the side of a flat bed without using bedrails?: A Little Help needed moving to and from a bed to a chair (including a wheelchair)?: A Little Help needed standing up from a chair using your arms (e.g., wheelchair or bedside chair)?: A Little Help needed to walk in hospital room?: A Little Help needed climbing 3-5 steps with a railing? : A Little 6 Click Score: 18    End of Session Equipment Utilized During Treatment: Gait belt;Back brace Activity Tolerance: Patient tolerated treatment well Patient left: in chair;with call bell/phone within reach Nurse Communication: Mobility status PT Visit Diagnosis: Unsteadiness on feet (R26.81);Pain Pain - part of body:  (back)     Time: 8894-8877 PT Time Calculation (min) (ACUTE ONLY): 17 min  Charges:    $Gait Training: 8-22 mins $Self Care/Home Management: 8-22 PT General Charges $$ ACUTE PT VISIT: 1 Visit                     Leita Sable, PT, DPT Acute Rehabilitation Services Secure Chat Preferred Office: [phone]    Leita JONETTA Sable 04/21/2024, 12:04 PM

## 2024-04-21 NOTE — Progress Notes (Signed)
    Patient doing well PO Day 1 S/P L4-5 TLIF . She reports her leg feels much better and she has been up and walking several times. She has expected PO LBP and reports it is well controlled with her medications. She cannot tolerate codeine products and is on Dilaudid  and is mildly impaired in her speech and sedated from this but answers question appropriately. She has eaten breakfast and been to bathroom without difficulty.   Physical Exam: Vitals:   04/21/24 0353 04/21/24 0729  BP: 125/62 (!) 130/59  Pulse: (!) 56 (!) 56  Resp: 18 19  Temp: 97.6 F (36.4 C) 98.2 F (36.8 C)  SpO2: 95% 99%    Dressing in place, CDI, brace at bedside. Pt sitting up in bed comfortably  NVI  POD #1 s/p L4/5 TLIF with improved leg pain and controlled LBP, doing well  - up with PT/OT, encourage ambulation - Dilaudid  for pain, Robaxin  for muscle spasms  -Sent to pharmacy and encouraged PRN dosing 1mg  only  - likely d/c home today with f/u in 2 weeks

## 2024-04-21 NOTE — Evaluation (Signed)
 Occupational Therapy Evaluation and DC Summary  Patient Details Name: Mia [name] MRN: [id number] DOB: 30-Jul-1951 Today's Date: 04/21/2024   History of Present Illness   Pt is a 73 yo female who presents to [facility] on 04/20/24 with spinal stenosis of L4/L5, s/p elective L4/L5 decompression and fusion. PMH of DDD, DVT, Fibromylagia, HTN, mild cognitive impairment, Sclerosis, osteoporosis, PONV, Raynaud phenomenon.     Clinical Impressions Pt admitted for above, PTA pt reports being ind with ADLs/iADLs and living with spouse. Pt currently c/o increased back pain with transfers but able to stand and ambulate with CGA +RW, educated pt on compensatory strategies for ADLs and she demonstrated ability to complete them with CGA to setup, min  A needed to don brace accurately. Anticipate to be function more independently pending progress with pain tolerance. OT signing off on pt as this time, she would benefit from HHPT at DC to progress with balance/gait.       If plan is discharge home, recommend the following:   A little help with walking and/or transfers;A little help with bathing/dressing/bathroom     Functional Status Assessment   Patient has had a recent decline in their functional status and demonstrates the ability to make significant improvements in function in a reasonable and predictable amount of time.     Equipment Recommendations   None recommended by OT     Recommendations for Other Services         Precautions/Restrictions   Precautions Precautions: Back Precaution Booklet Issued: Yes (comment) Recall of Precautions/Restrictions: Intact Required Braces or Orthoses: Spinal Brace Spinal Brace: Lumbar corset;Applied in sitting position Restrictions Weight Bearing Restrictions Per Provider Order: No     Mobility Bed Mobility               General bed mobility comments: Pt in recliner on arrival    Transfers Overall transfer level: Needs  assistance Equipment used: Rolling walker (2 wheels) Transfers: Sit to/from Stand Sit to Stand: Contact guard assist           General transfer comment: STSx3 from recliner, increased time needed to rise into standing. Increase in pain with transition      Balance Overall balance assessment: Needs assistance Sitting-balance support: Feet supported, No upper extremity supported Sitting balance-Leahy Scale: Good Sitting balance - Comments: in recliner.     Standing balance-Leahy Scale: Fair Standing balance comment: static standing.                           ADL either performed or assessed with clinical judgement   ADL Overall ADL's : Needs assistance/impaired Eating/Feeding: Independent;Sitting   Grooming: Standing;Contact guard assist Grooming Details (indicate cue type and reason): discussed use of cups for oral rinse Upper Body Bathing: Sitting;Set up   Lower Body Bathing: Sitting/lateral leans;Contact guard assist Lower Body Bathing Details (indicate cue type and reason): pt owns shower seat, discussed figure four position for LBB Upper Body Dressing : Sitting;Set up   Lower Body Dressing: Sit to/from stand;Sitting/lateral leans;Contact guard assist Lower Body Dressing Details (indicate cue type and reason): using figure four position to don underwear and socks. Toilet Transfer: Contact guard assist;Rolling walker (2 wheels)   Toileting- Clothing Manipulation and Hygiene: Contact guard assist;Sit to/from stand Toileting - Clothing Manipulation Details (indicate cue type and reason): Demonstrated ability to reach bottom       General ADL Comments: min A to don brace, reinforced setup of brace with pt.  Vision         Perception         Praxis         Pertinent Vitals/Pain Pain Assessment Pain Assessment: Faces Faces Pain Scale: Hurts little more Pain Location: back, op site Pain Descriptors / Indicators: Aching Pain Intervention(s):  Monitored during session, Limited activity within patient's tolerance     Extremity/Trunk Assessment Upper Extremity Assessment Upper Extremity Assessment: Overall WFL for tasks assessed   Lower Extremity Assessment Lower Extremity Assessment: Defer to PT evaluation   Cervical / Trunk Assessment Cervical / Trunk Assessment: Back Surgery   Communication Communication Communication: No apparent difficulties   Cognition Arousal: Alert Behavior During Therapy: WFL for tasks assessed/performed Cognition: History of cognitive impairments             OT - Cognition Comments: mild cognitive impairments at baseline.                 Following commands: Intact       Cueing  General Comments   Cueing Techniques: Verbal cues  Pt spouse present at end of sessions.   Exercises     Shoulder Instructions      Home Living Family/patient expects to be discharged to:: Private residence Living Arrangements: Spouse/significant other;Other relatives                 Bathroom Shower/Tub: Producer, television/film/video: Standard     Home Equipment: BSC/3in1;Shower seat   Additional Comments: BSC to go over toilet.      Prior Functioning/Environment Prior Level of Function : Independent/Modified Independent               ADLs Comments: ind    OT Problem List: Decreased knowledge of precautions;Impaired balance (sitting and/or standing);Decreased activity tolerance;Pain   OT Treatment/Interventions:        OT Goals(Current goals can be found in the care plan section)   Acute Rehab OT Goals OT Goal Formulation: All assessment and education complete, DC therapy Time For Goal Achievement: 05/05/24 Potential to Achieve Goals: Good   OT Frequency:       Co-evaluation              AM-PAC OT 6 Clicks Daily Activity     Outcome Measure Help from another person eating meals?: None Help from another person taking care of personal grooming?: A  Little Help from another person toileting, which includes using toliet, bedpan, or urinal?: A Little Help from another person bathing (including washing, rinsing, drying)?: A Little Help from another person to put on and taking off regular upper body clothing?: A Little Help from another person to put on and taking off regular lower body clothing?: A Little 6 Click Score: 19   End of Session Equipment Utilized During Treatment: Gait belt;Rolling walker (2 wheels);Back brace Nurse Communication: Mobility status  Activity Tolerance: Patient tolerated treatment well Patient left: in chair;with call bell/phone within reach;with family/visitor present  OT Visit Diagnosis: Unsteadiness on feet (R26.81);Other abnormalities of gait and mobility (R26.89);Pain Pain - part of body:  (back)                Time: 9052-8977 OT Time Calculation (min): 35 min Charges:  OT General Charges $OT Visit: 1 Visit OT Evaluation $OT Eval Low Complexity: 1 Low OT Treatments $Therapeutic Activity: 8-22 mins  04/21/2024  AB, OTR/L  Acute Rehabilitation Services  Office: [phone]   Curtistine JONETTA Das 04/21/2024, 11:32 AM

## 2024-04-22 DIAGNOSIS — M1990 Unspecified osteoarthritis, unspecified site: Secondary | ICD-10-CM | POA: Diagnosis not present

## 2024-04-22 DIAGNOSIS — K649 Unspecified hemorrhoids: Secondary | ICD-10-CM | POA: Diagnosis not present

## 2024-04-22 DIAGNOSIS — A0472 Enterocolitis due to Clostridium difficile, not specified as recurrent: Secondary | ICD-10-CM | POA: Diagnosis not present

## 2024-04-22 DIAGNOSIS — M810 Age-related osteoporosis without current pathological fracture: Secondary | ICD-10-CM | POA: Diagnosis not present

## 2024-04-22 DIAGNOSIS — D649 Anemia, unspecified: Secondary | ICD-10-CM | POA: Diagnosis not present

## 2024-04-22 DIAGNOSIS — I471 Supraventricular tachycardia, unspecified: Secondary | ICD-10-CM | POA: Diagnosis not present

## 2024-04-22 DIAGNOSIS — I7300 Raynaud's syndrome without gangrene: Secondary | ICD-10-CM | POA: Diagnosis not present

## 2024-04-22 DIAGNOSIS — I10 Essential (primary) hypertension: Secondary | ICD-10-CM | POA: Diagnosis not present

## 2024-04-22 DIAGNOSIS — M1120 Other chondrocalcinosis, unspecified site: Secondary | ICD-10-CM | POA: Diagnosis not present

## 2024-04-22 DIAGNOSIS — M48061 Spinal stenosis, lumbar region without neurogenic claudication: Secondary | ICD-10-CM | POA: Diagnosis not present

## 2024-04-22 DIAGNOSIS — M5030 Other cervical disc degeneration, unspecified cervical region: Secondary | ICD-10-CM | POA: Diagnosis not present

## 2024-04-22 DIAGNOSIS — K219 Gastro-esophageal reflux disease without esophagitis: Secondary | ICD-10-CM | POA: Diagnosis not present

## 2024-04-22 DIAGNOSIS — E039 Hypothyroidism, unspecified: Secondary | ICD-10-CM | POA: Diagnosis not present

## 2024-04-22 DIAGNOSIS — M797 Fibromyalgia: Secondary | ICD-10-CM | POA: Diagnosis not present

## 2024-04-22 DIAGNOSIS — I82409 Acute embolism and thrombosis of unspecified deep veins of unspecified lower extremity: Secondary | ICD-10-CM | POA: Diagnosis not present

## 2024-04-22 DIAGNOSIS — K279 Peptic ulcer, site unspecified, unspecified as acute or chronic, without hemorrhage or perforation: Secondary | ICD-10-CM | POA: Diagnosis not present

## 2024-04-22 DIAGNOSIS — K625 Hemorrhage of anus and rectum: Secondary | ICD-10-CM | POA: Diagnosis not present

## 2024-04-22 DIAGNOSIS — G250 Essential tremor: Secondary | ICD-10-CM | POA: Diagnosis not present

## 2024-04-22 DIAGNOSIS — M79652 Pain in left thigh: Secondary | ICD-10-CM | POA: Diagnosis not present

## 2024-04-22 DIAGNOSIS — G709 Myoneural disorder, unspecified: Secondary | ICD-10-CM | POA: Diagnosis not present

## 2024-04-22 DIAGNOSIS — G4730 Sleep apnea, unspecified: Secondary | ICD-10-CM | POA: Diagnosis not present

## 2024-04-22 DIAGNOSIS — K602 Anal fissure, unspecified: Secondary | ICD-10-CM | POA: Diagnosis not present

## 2024-04-22 DIAGNOSIS — Z4789 Encounter for other orthopedic aftercare: Secondary | ICD-10-CM | POA: Diagnosis not present

## 2024-04-22 DIAGNOSIS — M51369 Other intervertebral disc degeneration, lumbar region without mention of lumbar back pain or lower extremity pain: Secondary | ICD-10-CM | POA: Diagnosis not present

## 2024-04-22 DIAGNOSIS — G3184 Mild cognitive impairment, so stated: Secondary | ICD-10-CM | POA: Diagnosis not present

## 2024-04-23 ENCOUNTER — Other Ambulatory Visit: Payer: Self-pay | Admitting: Physician Assistant

## 2024-04-23 MED FILL — Thrombin For Soln 20000 Unit: CUTANEOUS | Qty: 1 | Status: AC

## 2024-04-25 DIAGNOSIS — A0472 Enterocolitis due to Clostridium difficile, not specified as recurrent: Secondary | ICD-10-CM | POA: Diagnosis not present

## 2024-04-25 DIAGNOSIS — I82409 Acute embolism and thrombosis of unspecified deep veins of unspecified lower extremity: Secondary | ICD-10-CM | POA: Diagnosis not present

## 2024-04-25 DIAGNOSIS — Z4789 Encounter for other orthopedic aftercare: Secondary | ICD-10-CM | POA: Diagnosis not present

## 2024-04-25 DIAGNOSIS — M48061 Spinal stenosis, lumbar region without neurogenic claudication: Secondary | ICD-10-CM | POA: Diagnosis not present

## 2024-04-25 DIAGNOSIS — G709 Myoneural disorder, unspecified: Secondary | ICD-10-CM | POA: Diagnosis not present

## 2024-04-25 DIAGNOSIS — I10 Essential (primary) hypertension: Secondary | ICD-10-CM | POA: Diagnosis not present

## 2024-04-27 DIAGNOSIS — A0472 Enterocolitis due to Clostridium difficile, not specified as recurrent: Secondary | ICD-10-CM | POA: Diagnosis not present

## 2024-04-27 DIAGNOSIS — I82409 Acute embolism and thrombosis of unspecified deep veins of unspecified lower extremity: Secondary | ICD-10-CM | POA: Diagnosis not present

## 2024-04-27 DIAGNOSIS — G709 Myoneural disorder, unspecified: Secondary | ICD-10-CM | POA: Diagnosis not present

## 2024-04-27 DIAGNOSIS — M48061 Spinal stenosis, lumbar region without neurogenic claudication: Secondary | ICD-10-CM | POA: Diagnosis not present

## 2024-04-27 DIAGNOSIS — I10 Essential (primary) hypertension: Secondary | ICD-10-CM | POA: Diagnosis not present

## 2024-04-27 DIAGNOSIS — Z4789 Encounter for other orthopedic aftercare: Secondary | ICD-10-CM | POA: Diagnosis not present

## 2024-04-30 ENCOUNTER — Encounter: Payer: Self-pay | Admitting: Family Medicine

## 2024-05-01 ENCOUNTER — Encounter: Payer: Self-pay | Admitting: Medical-Surgical

## 2024-05-01 ENCOUNTER — Ambulatory Visit: Payer: Self-pay | Admitting: Medical-Surgical

## 2024-05-01 ENCOUNTER — Ambulatory Visit

## 2024-05-01 ENCOUNTER — Ambulatory Visit (INDEPENDENT_AMBULATORY_CARE_PROVIDER_SITE_OTHER): Admitting: Medical-Surgical

## 2024-05-01 VITALS — BP 146/75 | HR 70 | Resp 20 | Ht 66.0 in | Wt 166.0 lb

## 2024-05-01 DIAGNOSIS — A0472 Enterocolitis due to Clostridium difficile, not specified as recurrent: Secondary | ICD-10-CM | POA: Diagnosis not present

## 2024-05-01 DIAGNOSIS — R059 Cough, unspecified: Secondary | ICD-10-CM | POA: Diagnosis not present

## 2024-05-01 DIAGNOSIS — R051 Acute cough: Secondary | ICD-10-CM | POA: Diagnosis not present

## 2024-05-01 DIAGNOSIS — M48061 Spinal stenosis, lumbar region without neurogenic claudication: Secondary | ICD-10-CM | POA: Diagnosis not present

## 2024-05-01 DIAGNOSIS — I82409 Acute embolism and thrombosis of unspecified deep veins of unspecified lower extremity: Secondary | ICD-10-CM | POA: Diagnosis not present

## 2024-05-01 DIAGNOSIS — G709 Myoneural disorder, unspecified: Secondary | ICD-10-CM | POA: Diagnosis not present

## 2024-05-01 DIAGNOSIS — Z981 Arthrodesis status: Secondary | ICD-10-CM | POA: Diagnosis not present

## 2024-05-01 DIAGNOSIS — Z4789 Encounter for other orthopedic aftercare: Secondary | ICD-10-CM | POA: Diagnosis not present

## 2024-05-01 DIAGNOSIS — I10 Essential (primary) hypertension: Secondary | ICD-10-CM | POA: Diagnosis not present

## 2024-05-01 MED ORDER — PROMETHAZINE-DM 6.25-15 MG/5ML PO SYRP
5.0000 mL | ORAL_SOLUTION | Freq: Four times a day (QID) | ORAL | 0 refills | Status: DC | PRN
Start: 2024-05-01 — End: 2024-05-30

## 2024-05-01 NOTE — Telephone Encounter (Signed)
 Task completed. Contacted [facility] at [phone]. Per the provider's note, the setting will be changed to 12 cm of water and is aware to send a report in two weeks. Attempted to contact the patient, her husband answered the call. He mentioned the patient was currently doing an Xray for excessive coughing. Per patient's husband, will send a MyChart msg with the following information.

## 2024-05-01 NOTE — Progress Notes (Signed)
        Established patient visit  History, exam, impression, and plan:  1. Acute cough (Primary) Pleasant 73 year old female presenting today for evaluation of an acute cough that started after having extensive back surgery on 6/26.  She was discharged from the hospital on 6/27 in stable condition with no concerns.  On 6/28, she awoke with a hacking cough that is intermittent but persistent during the day.  At night, it is much worse and she is coughing constantly.  Her cough started out productive of thick green mucus however this has now turned back to white.  She is unable to tolerate lying down because of the cough.  Denies any associated symptoms including fever, chills, myalgias, postnasal drip, rhinorrhea, shortness of breath, and chest pain.  She does have reports of a severely dry mouth as well as some itching in the throat, likely caused by intubation for surgery.  Suspect this may have something to do with her symptoms.  She has been trying Delsym which helps a bit during the day but nothing seems to control her symptoms at night.  Notes that she received a message from her PCP stating to try taking the Dilaudid  at bedtime however she has not taken her Dilaudid  at all since her surgery.  On evaluation today she is alert and oriented x 3.  Respirations are even and unlabored however she does become a bit dyspneic after severe coughing episodes.  Her cough is hacking and intractable at times.  Seems to be triggered by speech and laughter.  Lungs clear in the upper lobes however in the lower lobes she does have a little bit of rhonchi.  Getting chest x-ray for further evaluation and to make sure there is no infectious process.  Discussed symptom management.  She is having difficulty sleeping and would like something that is not codeine-based.  Sending and Promethazine  DM 4 times daily as needed.  Offered Tessalon  Perles however she does not think they work for her.  Advised to continue staying very  well-hydrated.  Okay to use Delsym in the daytime to avoid excessive drowsiness.  Update: Stat chest x-ray report back showing no acute cardiopulmonary process.  Results sent to patient via MyChart with instructions to continue with the plan above and give it some time. - DG Chest 2 View; Future  Procedures performed this visit: None.  Return if symptoms worsen or fail to improve.  __________________________________ Mia FREDRIK Palin, DNP, APRN, FNP-BC Primary Care and Sports Medicine [facility]

## 2024-05-03 DIAGNOSIS — I10 Essential (primary) hypertension: Secondary | ICD-10-CM | POA: Diagnosis not present

## 2024-05-03 DIAGNOSIS — I82409 Acute embolism and thrombosis of unspecified deep veins of unspecified lower extremity: Secondary | ICD-10-CM | POA: Diagnosis not present

## 2024-05-03 DIAGNOSIS — Z4789 Encounter for other orthopedic aftercare: Secondary | ICD-10-CM | POA: Diagnosis not present

## 2024-05-03 DIAGNOSIS — A0472 Enterocolitis due to Clostridium difficile, not specified as recurrent: Secondary | ICD-10-CM | POA: Diagnosis not present

## 2024-05-03 DIAGNOSIS — G709 Myoneural disorder, unspecified: Secondary | ICD-10-CM | POA: Diagnosis not present

## 2024-05-03 DIAGNOSIS — M48061 Spinal stenosis, lumbar region without neurogenic claudication: Secondary | ICD-10-CM | POA: Diagnosis not present

## 2024-05-03 NOTE — Discharge Summary (Signed)
 Patient ID: Mia [name] MRN: [id number] DOB/AGE: 1951-10-08 73 y.o.  Admit date: 04/20/2024 Discharge date: 04/21/2024  Admission Diagnoses:  Principal Problem:   Radiculopathy   Discharge Diagnoses:  Same  Past Medical History:  Diagnosis Date   Anal fissure    Anemia    Arthritis    Blood transfusion    C. difficile colitis    Chest pain    a. 01/2013 MV: EF 59%, no ischemia.   Chronic Dyspnea    a. 01/2013 Echo: EF 60-65%, Gr 1 DD, PASP 52mmHg.// Echo 01/2020: EF 60-65, no RWMA, GR 1 DD, GLS -21.6%, normal RV SF, trivial MR    COVID-19    DDD (degenerative disc disease), cervical    DDD (degenerative disc disease), lumbar    DVT (deep venous thrombosis) (HCC) 06/23/2023   left lower extremity   Fibromyalgia    Gall stones    GERD (gastroesophageal reflux disease)    gastritis   Hemorrhoids    Hypertension    Interstitial cystitis    MCI (mild cognitive impairment) 11/25/2017   Memory difficulty 12/14/2013   Neuromuscular disorder (HCC)    sclerosis   Osteoporosis    Peptic ulcer    PONV (postoperative nausea and vomiting)    Pseudogout    Raynaud phenomenon    Rectal bleeding    Sleep apnea    a. on cpap.   Stress fracture of left tibia 11/05/2016   SVT (supraventricular tachycardia) (HCC)    Syncope 09/10/2015   Thyroid  disease    hypothyroidism   Tremor, essential 05/14/2016    Surgeries: Procedure(s): TRANSFORAMINAL LUMBAR INTERBODY FUSION (TLIF) WITH PEDICLE SCREW FIXATION 1 LEVEL on 04/20/2024   Consultants: None  Discharged Condition: Improved  Hospital Course: Mia [name] is an 73 y.o. female who was admitted 04/20/2024 for operative treatment of Radiculopathy. Patient has severe unremitting pain that affects sleep, daily activities, and work/hobbies. After pre-op clearance the patient was taken to the operating room on 04/20/2024 and underwent  Procedure(s): TRANSFORAMINAL LUMBAR INTERBODY FUSION (TLIF) WITH PEDICLE SCREW FIXATION 1 LEVEL.     Patient was given perioperative antibiotics:  Anti-infectives (From admission, onward)    Start     Dose/Rate Route Frequency Ordered Stop   04/20/24 1630  ceFAZolin  (ANCEF ) IVPB 2g/100 mL premix        2 g 200 mL/hr over 30 Minutes Intravenous Every 8 hours 04/20/24 1541 04/21/24 1323   04/20/24 1012  vancomycin  (VANCOCIN ) powder  Status:  Discontinued          As needed 04/20/24 1012 04/20/24 1110   04/20/24 0600  ceFAZolin  (ANCEF ) IVPB 2g/100 mL premix        2 g 200 mL/hr over 30 Minutes Intravenous On call to O.R. 04/20/24 9445 04/20/24 0815        Patient was given sequential compression devices, early ambulation to prevent DVT.  Patient benefited maximally from hospital stay and there were no complications.    Recent vital signs: BP (!) 130/59 (BP Location: Left Arm)   Pulse (!) 56   Temp 98.2 F (36.8 C) (Oral)   Resp 19   Ht 5' 6 (1.676 m)   Wt 73.9 kg   SpO2 99%   BMI 26.31 kg/m    Discharge Medications:  Dilaudid  and Robaxin  in addition to home meds   Diagnostic Studies: DG Chest 2 View Result Date: 05/01/2024 CLINICAL DATA:  cough after surgery EXAM: CHEST - 2 VIEW COMPARISON:  May 31, 2023 FINDINGS: No focal airspace consolidation, pleural effusion, or pneumothorax. No cardiomegaly.No acute fracture or destructive lesion. Cervical fusion hardware again noted. Cholecystectomy clips. Multilevel thoracic osteophytosis. IMPRESSION: No acute cardiopulmonary abnormality. Electronically Signed   By: Rogelia Myers M.D.   On: 05/01/2024 16:42   DG Lumbar Spine 2-3 Views Result Date: 04/20/2024 CLINICAL DATA:  L4-5 fusion EXAM: LUMBAR SPINE - 2-3 VIEW COMPARISON:  Earlier today.  Lumbar spine MRI dated 01/15/2023. FINDINGS: PA and lateral C-arm images of the lower lumbar spine demonstrate interval interbody and pedicle screw and rod fusion at the L4-5 level with stable minimal anterolisthesis at that level. IMPRESSION: Interval L4-5 fusion. Electronically Signed   By:  Elspeth Bathe M.D.   On: 04/20/2024 12:09   DG Lumbar Spine 1 View Result Date: 04/20/2024 CLINICAL DATA:  L4-5 fusion EXAM: LUMBAR SPINE - 1 VIEW COMPARISON:  10/12/2022 FINDINGS: A portable cross-table lateral view of the lumbar spine demonstrates a metallic localizer with its tip posterior to the L3 spinous process and a metallic localizer with its tip posterior to the inferior aspect of the L4 spinous process. Multilevel degenerative changes are stable. IMPRESSION: Localizers as described above. Electronically Signed   By: Elspeth Bathe M.D.   On: 04/20/2024 12:06   DG C-Arm 1-60 Min-No Report Result Date: 04/20/2024 Fluoroscopy was utilized by the requesting physician.  No radiographic interpretation.   DG C-Arm 1-60 Min-No Report Result Date: 04/20/2024 Fluoroscopy was utilized by the requesting physician.  No radiographic interpretation.   DG C-Arm 1-60 Min-No Report Result Date: 04/20/2024 Fluoroscopy was utilized by the requesting physician.  No radiographic interpretation.    Disposition: Discharge disposition: 01-Home or Self Care       Discharge Instructions     Ambulatory referral to Home Health   Complete by: As directed    Please evaluate Amaree Loisel Cogan for admission to [facility].  Disciplines requested: Physical Therapy  Services to provide: Strengthening Exercises  Physician to follow patient's care (the person listed here will be responsible for signing ongoing orders): Referring Provider  Requested Start of Care Date: Tomorrow  I certify that this patient is under my care and that I, or a Nurse Practitioner or Physician's Assistant working with me, had a face-to-face encounter that meets the physician face-to-face requirements with patient on 04/21/2024. The encounter with the patient was in whole, or in part for the following medical condition(s) which is the primary reason for home health care (List medical condition). PO Lumbar fusion  Special Instructions:   No lift > 10#, no bend/twist of core/back. TLSO brace at all times when sitting or standing upright   Does the patient have Medicare or Medicaid?: Yes   The encounter with the patient was in whole, or in part, for the following medical condition, which is the primary reason for home health care: PO lumbar fusion   Reason for Medically Necessary Home Health Services: Therapy- Investment banker, operational, Patent examiner   My clinical findings support the need for the above services: Unable to leave home safely without assistance and/or assistive device   I certify that, based on my findings, the following services are medically necessary home health services: Physical therapy   Further, I certify that my clinical findings support that this patient is homebound due to: Unable to leave home safely without assistance      POD #1 s/p L4/5 TLIF with improved leg pain and controlled LBP, doing well   - up with  PT/OT, encourage ambulation - Dilaudid  for pain, Robaxin  for muscle spasms -Scripts for pain sent to pharmacy electronically  -D/C instructions sheet printed and in chart -D/C today  -F/U in office 2 weeks   Signed: Greydis Stlouis J Shonte Soderlund 05/03/2024, 8:16 AM

## 2024-05-05 DIAGNOSIS — I82409 Acute embolism and thrombosis of unspecified deep veins of unspecified lower extremity: Secondary | ICD-10-CM | POA: Diagnosis not present

## 2024-05-05 DIAGNOSIS — M48061 Spinal stenosis, lumbar region without neurogenic claudication: Secondary | ICD-10-CM | POA: Diagnosis not present

## 2024-05-05 DIAGNOSIS — G709 Myoneural disorder, unspecified: Secondary | ICD-10-CM | POA: Diagnosis not present

## 2024-05-05 DIAGNOSIS — A0472 Enterocolitis due to Clostridium difficile, not specified as recurrent: Secondary | ICD-10-CM | POA: Diagnosis not present

## 2024-05-05 DIAGNOSIS — I10 Essential (primary) hypertension: Secondary | ICD-10-CM | POA: Diagnosis not present

## 2024-05-05 DIAGNOSIS — Z4789 Encounter for other orthopedic aftercare: Secondary | ICD-10-CM | POA: Diagnosis not present

## 2024-05-08 DIAGNOSIS — I82409 Acute embolism and thrombosis of unspecified deep veins of unspecified lower extremity: Secondary | ICD-10-CM | POA: Diagnosis not present

## 2024-05-08 DIAGNOSIS — G709 Myoneural disorder, unspecified: Secondary | ICD-10-CM | POA: Diagnosis not present

## 2024-05-08 DIAGNOSIS — I10 Essential (primary) hypertension: Secondary | ICD-10-CM | POA: Diagnosis not present

## 2024-05-08 DIAGNOSIS — M48061 Spinal stenosis, lumbar region without neurogenic claudication: Secondary | ICD-10-CM | POA: Diagnosis not present

## 2024-05-08 DIAGNOSIS — A0472 Enterocolitis due to Clostridium difficile, not specified as recurrent: Secondary | ICD-10-CM | POA: Diagnosis not present

## 2024-05-08 DIAGNOSIS — Z4789 Encounter for other orthopedic aftercare: Secondary | ICD-10-CM | POA: Diagnosis not present

## 2024-05-11 NOTE — Progress Notes (Unsigned)
 Office Visit Note  Patient: Mia [name]             Date of Birth: Feb 05, 1951           MRN: [id number]             PCP: Alvan Dorothyann BIRCH, MD Referring: Alvan Dorothyann BIRCH, * Visit Date: 05/25/2024 Occupation: @GUAROCC @  Subjective:  Recovering from back surgery   History of Present Illness: Mia [name] is a 73 y.o. female with history of seronegative rheumatoid arthritis.  Patient has been taking Arava  10 mg alternating with 20 mg every other day.  She is tolerating Arava  without any side effects and has not had any recent gaps in therapy.  Patient reports that she underwent a lumbar decompression and fusion on 04/21/2024 by Dr. Beuford.  Patient states that her symptoms of radiculopathy have completely resolved.  She has to wear a brace for 6 weeks but states that her pain levels have been very well-controlled.  Patient states that she is unable to bend, twist, or lift at this time but overall has been doing well.  She denies any complications or signs of infection.  She is been using a walker to help with stabilization.  Patient states that she has had recurrent bouts of vertigo and has been taking meclizine  as needed.  Patient continues to have chronic pain in the left knee and is aware that she will require knee replacement in the future.  Patient states that she has torn her right quadriceps muscle but is unsure when she will proceed with surgical invention in the future.  Activities of Daily Living:  Patient reports morning stiffness for  30 minutes.   Patient Denies nocturnal pain.  Difficulty dressing/grooming: Denies Difficulty climbing stairs: Reports Difficulty getting out of chair: Reports Difficulty using hands for taps, buttons, cutlery, and/or writing: Denies  Review of Systems  Constitutional:  Positive for fatigue.  HENT:  Positive for mouth dryness. Negative for mouth sores.   Eyes:  Positive for dryness.  Respiratory:  Negative for shortness of breath.    Cardiovascular:  Negative for chest pain and palpitations.  Gastrointestinal:  Negative for blood in stool, constipation and diarrhea.  Endocrine: Negative for increased urination.  Genitourinary:  Positive for involuntary urination.  Musculoskeletal:  Positive for joint pain, gait problem, joint pain, joint swelling, myalgias, muscle weakness, morning stiffness, muscle tenderness and myalgias.  Skin:  Negative for color change, rash, hair loss and sensitivity to sunlight.  Allergic/Immunologic: Negative for susceptible to infections.  Neurological:  Positive for dizziness. Negative for headaches.  Hematological:  Negative for swollen glands.  Psychiatric/Behavioral:  Negative for depressed mood and sleep disturbance. The patient is not nervous/anxious.     PMFS History:  Patient Active Problem List   Diagnosis Date Noted   Radiculopathy 04/20/2024   History of DVT of lower extremity 08/23/2023   Pain of right sacroiliac joint 10/15/2022   Palpitations 06/02/2022   Pernicious anemia 09/11/2020   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   Weakness of both lower extremities 06/11/2020   Hyponatremia 05/22/2020   Gait instability 06/20/2019   Facet arthritis of lumbar region 05/10/2018   MCI (mild cognitive impairment) 11/25/2017   Restrictive lung disease 07/30/2017   Mixed hyperlipidemia 06/18/2017   Rectocele 04/26/2017   Irritable bowel syndrome with both constipation and diarrhea 04/26/2017   Osteoporosis 04/18/2017   Trochanteric bursitis of both hips 02/24/2017   Inflammatory arthritis 09/30/2016   High risk medication use 09/30/2016  History of Clostridium difficile colitis 09/30/2016   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   Pseudogout 09/30/2016   DDD cervical spine status post fusion 09/30/2016   DDD thoracic spine 09/30/2016   H/O total knee replacement, right 09/30/2016   Age-related osteoporosis without current pathological fracture 09/30/2016   Tremor, essential  05/14/2016   Right foot pain 04/14/2016   B12 deficiency 09/10/2015   Cervical facet joint syndrome 09/10/2015   Chronic fatigue 09/10/2015   Aortic atherosclerosis (HCC) 04/01/2015   DOE (dyspnea on exertion)    Primary osteoarthritis of right knee 08/13/2014   Benign head tremor 06/11/2014   Lumbar degenerative disc disease 06/11/2014   IFG (impaired fasting glucose) 03/09/2014   Hemorrhoid 03/09/2014   Retinal wrinkling, right eye 03/09/2014   Fibromyalgia 03/09/2014   GERD (gastroesophageal reflux disease) 03/09/2014   History of arthroplasty of right knee 01/31/2014   Hemorrhoids, external, thrombosed 06/22/2011   Hypertension 03/11/2011   SVT (supraventricular tachycardia) (HCC)    Raynaud phenomenon    Nonspecific (abnormal) findings on radiological and other examination of body structure 08/25/2010   COMPUTERIZED TOMOGRAPHY, CHEST, ABNORMAL 08/25/2010   OSA (obstructive sleep apnea) 04/24/2009   Allergic rhinitis 06/11/2008   Dyspnea 06/11/2008   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 difficile colitis    Chest pain    a. 01/2013 MV: EF 59%, no ischemia.   Chronic Dyspnea    a. 01/2013 Echo: EF 60-65%, Gr 1 DD, PASP 67mmHg.// Echo 01/2020: EF 60-65, no RWMA, GR 1 DD, GLS -21.6%, normal RV SF, trivial MR    COVID-19    DDD (degenerative disc disease), cervical    DDD (degenerative disc disease), lumbar    DVT (deep venous thrombosis) (HCC) 06/23/2023   left lower extremity   Fibromyalgia    Gall stones    GERD (gastroesophageal reflux disease)    gastritis   Hemorrhoids    Hypertension    Interstitial cystitis    MCI (mild cognitive impairment) 11/25/2017   Memory difficulty 12/14/2013   Neuromuscular disorder (HCC)    sclerosis    Osteoporosis    Peptic ulcer    PONV (postoperative nausea and vomiting)    Pseudogout    Raynaud phenomenon    Rectal bleeding    Sleep apnea    a. on cpap.   Stress fracture of left tibia 11/05/2016   SVT (supraventricular tachycardia) (HCC)    Syncope 09/10/2015   Thyroid  disease    hypothyroidism   Tremor, essential 05/14/2016    Family History  Problem Relation Age of Onset   Heart attack Mother    Stroke Mother    Diabetes Mother    Heart disease Mother    Colon polyps Mother    Asthma Mother    Dementia Mother    Alzheimer's disease Mother    Heart disease Father    Heart attack Father    Asthma Father    Stroke Sister    Hypertension Sister    Rheum arthritis Sister    Dementia Sister    Asthma Sister    Parkinson's disease Sister    Lupus Sister    Asthma Sister    Breast cancer Maternal Grandmother    Wilson's disease Maternal Grandmother  Pancreatic cancer Maternal Grandfather    Autoimmune disease Daughter        in eyes   Arthritis Daughter    Deep vein thrombosis Daughter    Past Surgical History:  Procedure Laterality Date   APPENDECTOMY     BLADDER SURGERY     x2   BLADDER SURGERY     BREAST BIOPSY     CARDIAC CATHETERIZATION     CARPAL TUNNEL RELEASE     CATARACT EXTRACTION Bilateral    CHOLECYSTECTOMY     DILATION AND CURETTAGE OF UTERUS     ENTEROCELE REPAIR     x2   EYE SURGERY     retina   herniated disc  11/25/2021   L4 and L5   hysterectomy - unknown type     JOINT REPLACEMENT     KNEE ARTHROPLASTY     KNEE SURGERY     x6   NECK SURGERY     fusion   OOPHORECTOMY     QUADRICEPS REPAIR Right    RECTOCELE REPAIR     x2   SHOULDER ARTHROSCOPY WITH SUBACROMIAL DECOMPRESSION, ROTATOR CUFF REPAIR AND BICEP TENDON REPAIR  10/06/2012   Procedure: SHOULDER ARTHROSCOPY WITH SUBACROMIAL DECOMPRESSION, ROTATOR CUFF REPAIR AND BICEP TENDON REPAIR;  Surgeon: Eva Elsie Herring, MD;  Location: [facility];  Service:  Orthopedics;  Laterality: Right;  Arthroscopic  Repair  of  Subscapularis, Open Biceps Tenodesis   SHOULDER SURGERY     bilateral- bones spur   SHOULDER SURGERY Left 04/15/2021   TONSILLECTOMY     TOTAL KNEE ARTHROPLASTY     TOTAL SHOULDER ARTHROPLASTY     TRANSFORAMINAL LUMBAR INTERBODY FUSION (TLIF) WITH PEDICLE SCREW FIXATION 1 LEVEL Left 04/20/2024   Procedure: TRANSFORAMINAL LUMBAR INTERBODY FUSION (TLIF) WITH PEDICLE SCREW FIXATION 1 LEVEL;  Surgeon: Beuford Anes, MD;  Location: [facility];  Service: Orthopedics;  Laterality: Left;  LEFT-SIDED LUMBAR 4 - LUMBAR 5 TRANSFORAMINAL LUMBAR INTERBODY FUSION AND DECOMPRESSION WITH INSTRUMENTATION AND ALLOGRAFT   Social History   Social History Narrative   Lives with her husband. Her grand daughter is living with them.  She likes to hang out with her friends and play games and do bible study.   Immunization History  Administered Date(s) Administered   Fluad Quad(high Dose 65+) 07/13/2019, 07/10/2020, 07/22/2021, 07/22/2022   Fluad Trivalent(High Dose 65+) 07/26/2023   Hepatitis B, ADULT 02/25/2021, 03/28/2021, 08/26/2021   Influenza Split 07/26/2012, 06/11/2014, 07/25/2015, 06/23/2018   Influenza Whole 07/30/2009, 08/19/2010   Influenza, High Dose Seasonal PF 07/06/2016, 07/05/2017   Influenza,inj,Quad PF,6+ Mos 06/11/2014, 07/25/2015, 06/23/2018   Influenza-Unspecified 07/26/2012, 07/26/2013, 09/05/2013, 06/11/2014, 07/25/2015, 06/23/2018, 06/27/2019, 07/13/2019, 07/10/2020, 07/22/2021   Moderna Sars-Covid-2 Vaccination 02/29/2020, 03/28/2020, 10/04/2020   PNEUMOCOCCAL CONJUGATE-20 05/16/2024   Pneumococcal Conjugate-13 01/04/2017   Pneumococcal Polysaccharide-23 07/30/2009, 11/14/2018   Pneumococcal-Unspecified 10/27/2015   Tdap 02/13/2008, 05/12/2018   Zoster Recombinant(Shingrix) 02/23/2022, 05/05/2022   Zoster, Live 09/01/2011     Objective: Vital Signs: BP 112/74 (BP Location: Left Arm, Patient Position: Sitting, Cuff Size:  Normal)   Pulse (!) 57   Resp 15   Ht 5' 6 (1.676 m)   Wt 168 lb (76.2 kg)   BMI 27.12 kg/m    Physical Exam Vitals and nursing note reviewed.  Constitutional:      Appearance: She is well-developed.  HENT:     Head: Normocephalic and atraumatic.  Eyes:     Conjunctiva/sclera: Conjunctivae normal.  Cardiovascular:     Rate and Rhythm:  Normal rate and regular rhythm.     Heart sounds: Normal heart sounds.  Pulmonary:     Effort: Pulmonary effort is normal.     Breath sounds: Normal breath sounds.  Abdominal:     General: Bowel sounds are normal.     Palpations: Abdomen is soft.  Musculoskeletal:     Cervical back: Normal range of motion.  Lymphadenopathy:     Cervical: No cervical adenopathy.  Skin:    General: Skin is warm and dry.     Capillary Refill: Capillary refill takes less than 2 seconds.  Neurological:     Mental Status: She is alert and oriented to person, place, and time.  Psychiatric:        Behavior: Behavior normal.      Musculoskeletal Exam: Patient remained seated during examination today due to being in a full back brace.  C-spine has limited range of motion with lateral rotation.  Shoulder joints have good range of motion.  Elbow joints, wrist joints, MCPs, PIPs, DIPs have good range of motion with no synovitis.  Synovial thickening of MCP joints but no synovitis noted.  Hip joints have good range of motion with no groin pain.  Knee joints have good range of motion no warmth or effusion.  CDAI Exam: CDAI Score: -- Patient Global: --; Provider Global: -- Swollen: --; Tender: -- Joint Exam 05/25/2024   No joint exam has been documented for this visit   There is currently no information documented on the homunculus. Go to the Rheumatology activity and complete the homunculus joint exam.  Investigation: No additional findings.  Imaging: DG Chest 2 View Result Date: 05/01/2024 CLINICAL DATA:  cough after surgery EXAM: CHEST - 2 VIEW COMPARISON:   May 31, 2023 FINDINGS: No focal airspace consolidation, pleural effusion, or pneumothorax. No cardiomegaly.No acute fracture or destructive lesion. Cervical fusion hardware again noted. Cholecystectomy clips. Multilevel thoracic osteophytosis. IMPRESSION: No acute cardiopulmonary abnormality. Electronically Signed   By: Rogelia Myers M.D.   On: 05/01/2024 16:42    Recent Labs: Lab Results  Component Value Date   WBC 5.7 04/12/2024   HGB 13.4 04/12/2024   PLT 250 04/12/2024   NA 138 04/12/2024   K 4.6 04/12/2024   CL 101 04/12/2024   CO2 29 04/12/2024   GLUCOSE 118 (H) 04/12/2024   BUN 5 (L) 04/12/2024   CREATININE 0.70 04/12/2024   BILITOT 0.5 01/17/2024   ALKPHOS 215 (H) 01/17/2024   AST 68 (H) 01/17/2024   ALT 66 (H) 01/17/2024   PROT 7.0 01/17/2024   ALBUMIN  4.3 01/17/2024   CALCIUM  9.8 04/12/2024   GFRAA 94 03/10/2021    Speciality Comments: No specialty comments available.  Procedures:  No procedures performed Allergies: Codeine, Myrbetriq  [mirabegron], Nystatin, Percocet [oxycodone-acetaminophen ], Propoxyphene, Acetaminophen , Erythromycin base, Ketorolac , Methadone, Oxycodone-acetaminophen , Celecoxib, Darvocet [propoxyphene n-acetaminophen ], Erythromycin, Hydrocodone , Nitrofurantoin, Pregabalin , Toradol  [ketorolac  tromethamine ], Tramadol, and Verapamil     Assessment / Plan:     Visit Diagnoses: Seronegative rheumatoid arthritis (HCC): She has no synovitis on examination today.  She has not had any signs or symptoms of a rheumatoid arthritis flare.  She has clinically been doing well alternating Arava  10 mg and 20 mg every other day.  She is tolerating Arava  without any side effects and has not had any gaps in therapy.  No medication changes will be made at this time.  A refill of arava  will be sent to the pharmacy today.  She was advised to notify us  if she develops  any signs or symptoms of a flare.  She will follow-up in the office in 5 months or sooner if  needed.  High risk medication use - Arava  10 mg alternating with 20 mg every other day.  CBC and BMP updated on 04/12/24.  CHEM profile obtained on 04/13/2024.  She will return for updated lab work in September and every 3 months.  No recent or recurrent infections.  Discussed the importance of holding arava  if she develops signs or symptoms of an infection and to resume once the infection has completely cleared.   Elevated LFTs: AST and ALT were elevated at 68 and 66 respectively on 01/17/2024.  CMP rechecked on 04/13/2024: AST 42, ALT 39. Evaluated by gastroenterology on 04/13/2024  Trigger finger, right ring finger: Not currently symptomatic.  Underwent an ultrasound-guided injection on 12/14/2023.  History of DVT (deep vein thrombosis) - 06/23/23-Left lower extremity--taking Eliquis  as prescribed.  Raynaud's phenomenon without gangrene: Not currently symptomatic.  No signs of sclerodactyly noted.  S/P arthroscopy of left shoulder: Dr. Inocencio on 04/15/2021. She had left shoulder joint cortisone injection by Dr. Inocencio in February 2023.   H/O total knee replacement, right: She has a torn quadricep but is not yet scheduled for surgical intervention.   Primary osteoarthritis of left knee: Chronic pain.  Using a walker to assist with ambulation.  She is planning to proceed with a left knee replacement in the future.  No effusion noted on examination today.  DDD (degenerative disc disease), cervical: C-spine has limited range of motion.  DDD (degenerative disc disease), thoracic: Thoracic kyphosis noted.  Currently wearing a back brace.  S/P lumbar fusion:   Status post lumbar microdiscectomy left L4-5 on November 25, 2021.   Underwent decompression and fusion on 04/21/2024 performed by Dr. Beuford.  She has been having to wear a back brace for the first 6 weeks.  She has been unable to bend, twist, or lift so her activities have been limited.  Her pain levels have been well-controlled.  She has no  symptoms of radiculopathy.  Pseudogout: No signs or symptoms of a pseudogout flare.  History of foot fracture  Other osteoporosis without current pathological fracture - DEXA 08/25/23: The BMD measured at Femur Neck Right is 0.744 g/cm2 with a T-score of -2.1.  She continues to take calcium  and vitamin D . Due to update DEXA in October 2026.  Medication monitoring encounter: Followed by Dr. Sam for management of osteoporosis. Previous therapy alendronate , Prolia , IV Reclast .Received 1st dose of zoledronic  acid 01/06/2022.   Fibromyalgia: Patient experiences intermittent myalgias and muscle tenderness due to fibromyalgia.  She has had more frequent flares recently which she attributes to her recent surgery as well as weather changes.  Chronic fatigue: Patient has been more sedentary than usual while recovering from surgery.  Other medical conditions are listed as follows:  History of hypertension: Blood pressure was 112/74 today in the office.  History of gastroesophageal reflux (GERD)  History of CHF (congestive heart failure)  IC (interstitial cystitis)  History of Clostridium difficile colitis  Mixed hyperlipidemia  S/P carpal tunnel release  Orders: No orders of the defined types were placed in this encounter.  No orders of the defined types were placed in this encounter.    Follow-Up Instructions: Return in about 5 months (around 10/25/2024) for Rheumatoid arthritis.   Waddell CHRISTELLA Craze, PA-C  Note - This record has been created using Dragon software.  Chart creation errors have been sought, but may not always  have  been located. Such creation errors do not reflect on  the standard of medical care.

## 2024-05-15 ENCOUNTER — Ambulatory Visit

## 2024-05-16 ENCOUNTER — Ambulatory Visit (INDEPENDENT_AMBULATORY_CARE_PROVIDER_SITE_OTHER)

## 2024-05-16 VITALS — BP 121/62 | HR 69 | Ht 66.0 in | Wt 164.0 lb

## 2024-05-16 DIAGNOSIS — E538 Deficiency of other specified B group vitamins: Secondary | ICD-10-CM

## 2024-05-16 DIAGNOSIS — Z23 Encounter for immunization: Secondary | ICD-10-CM | POA: Diagnosis not present

## 2024-05-16 MED ORDER — CYANOCOBALAMIN 1000 MCG/ML IJ SOLN
1000.0000 ug | Freq: Once | INTRAMUSCULAR | Status: AC
  Administered 2024-05-16: 1000 ug via INTRAMUSCULAR

## 2024-05-16 NOTE — Patient Instructions (Signed)
Return in 30 days for next B12 injection as nurse visit.

## 2024-05-16 NOTE — Progress Notes (Signed)
   Established Patient Office Visit  Subjective   Patient ID: Mia [name], female    DOB: 06-03-1951  Age: 73 y.o. MRN: [id number]  Chief Complaint  Patient presents with   B12 deficiency    B12 injection nurse visit   Immunizations    Prevnar 20 vaccine    HPI  B12 deficiency - B12 injection nurse visit. Patient denies irregular heart rate, weakness or GI problems. Last B12 lab work done on 08/23/2023 showed B!@ >2000. Per Dr. Alvan will redraw B12 lab work before injection today.  Patient inquiring about pneumonia vaccine- chart shows 4 given previously  ( prevnar 13 on 01/04/2017; two pneumovax 23 11/14/2018 and 08/09/2009; one unsecified 10/27/2015) -per Dr. Alvan patient is in need of prevnar 20.   ROS    Objective:     BP 121/62 (BP Location: Left Arm, Patient Position: Sitting, Cuff Size: Normal)   Pulse 69   Ht 5' 6 (1.676 m)   Wt 164 lb (74.4 kg)   SpO2 97%   BMI 26.47 kg/m    Physical Exam   No results found for any visits on 05/16/24.    The 10-year ASCVD risk score (Arnett DK, et al., 2019) is: 23.6%    Assessment & Plan:  Vitamin B12 lab work drawn before injection of B12 administered in office. Cyanocobalamin  1000mcg admin IM  LUOQ. Patient tolerated injection well without complication. Will return in 30 days for next B12 injection as nurse visit.  Also administered Prevnar 20 IM Left deltoid. Patient also tolerated this injection well without complication.  Problem List Items Addressed This Visit       Other   B12 deficiency - Primary   Relevant Orders   B12   Other Visit Diagnoses       Pneumococcal vaccination given       Relevant Orders   Pneumococcal conjugate vaccine 20-valent (Prevnar 20) (Completed)       Return in about 30 days (around 06/15/2024) for next B12 injection as nurse visit.    Mia SHAUNNA Plenty, LPN

## 2024-05-17 ENCOUNTER — Ambulatory Visit: Payer: Self-pay | Admitting: Family Medicine

## 2024-05-17 DIAGNOSIS — H524 Presbyopia: Secondary | ICD-10-CM | POA: Diagnosis not present

## 2024-05-17 DIAGNOSIS — H35371 Puckering of macula, right eye: Secondary | ICD-10-CM | POA: Diagnosis not present

## 2024-05-17 DIAGNOSIS — E119 Type 2 diabetes mellitus without complications: Secondary | ICD-10-CM | POA: Diagnosis not present

## 2024-05-17 DIAGNOSIS — Z7984 Long term (current) use of oral hypoglycemic drugs: Secondary | ICD-10-CM | POA: Diagnosis not present

## 2024-05-17 LAB — HM DIABETES EYE EXAM

## 2024-05-17 LAB — VITAMIN B12: Vitamin B-12: 648 pg/mL (ref 232–1245)

## 2024-05-17 NOTE — Progress Notes (Signed)
 B12 looks good!

## 2024-05-22 DIAGNOSIS — G3184 Mild cognitive impairment, so stated: Secondary | ICD-10-CM | POA: Diagnosis not present

## 2024-05-22 DIAGNOSIS — I10 Essential (primary) hypertension: Secondary | ICD-10-CM | POA: Diagnosis not present

## 2024-05-22 DIAGNOSIS — M810 Age-related osteoporosis without current pathological fracture: Secondary | ICD-10-CM | POA: Diagnosis not present

## 2024-05-22 DIAGNOSIS — M79652 Pain in left thigh: Secondary | ICD-10-CM | POA: Diagnosis not present

## 2024-05-22 DIAGNOSIS — I7300 Raynaud's syndrome without gangrene: Secondary | ICD-10-CM | POA: Diagnosis not present

## 2024-05-22 DIAGNOSIS — K219 Gastro-esophageal reflux disease without esophagitis: Secondary | ICD-10-CM | POA: Diagnosis not present

## 2024-05-22 DIAGNOSIS — K602 Anal fissure, unspecified: Secondary | ICD-10-CM | POA: Diagnosis not present

## 2024-05-22 DIAGNOSIS — K279 Peptic ulcer, site unspecified, unspecified as acute or chronic, without hemorrhage or perforation: Secondary | ICD-10-CM | POA: Diagnosis not present

## 2024-05-22 DIAGNOSIS — Z4789 Encounter for other orthopedic aftercare: Secondary | ICD-10-CM | POA: Diagnosis not present

## 2024-05-22 DIAGNOSIS — M5030 Other cervical disc degeneration, unspecified cervical region: Secondary | ICD-10-CM | POA: Diagnosis not present

## 2024-05-22 DIAGNOSIS — I471 Supraventricular tachycardia, unspecified: Secondary | ICD-10-CM | POA: Diagnosis not present

## 2024-05-22 DIAGNOSIS — D649 Anemia, unspecified: Secondary | ICD-10-CM | POA: Diagnosis not present

## 2024-05-22 DIAGNOSIS — M48061 Spinal stenosis, lumbar region without neurogenic claudication: Secondary | ICD-10-CM | POA: Diagnosis not present

## 2024-05-22 DIAGNOSIS — K649 Unspecified hemorrhoids: Secondary | ICD-10-CM | POA: Diagnosis not present

## 2024-05-22 DIAGNOSIS — A0472 Enterocolitis due to Clostridium difficile, not specified as recurrent: Secondary | ICD-10-CM | POA: Diagnosis not present

## 2024-05-22 DIAGNOSIS — M1990 Unspecified osteoarthritis, unspecified site: Secondary | ICD-10-CM | POA: Diagnosis not present

## 2024-05-22 DIAGNOSIS — G709 Myoneural disorder, unspecified: Secondary | ICD-10-CM | POA: Diagnosis not present

## 2024-05-22 DIAGNOSIS — G4730 Sleep apnea, unspecified: Secondary | ICD-10-CM | POA: Diagnosis not present

## 2024-05-22 DIAGNOSIS — G250 Essential tremor: Secondary | ICD-10-CM | POA: Diagnosis not present

## 2024-05-22 DIAGNOSIS — M51369 Other intervertebral disc degeneration, lumbar region without mention of lumbar back pain or lower extremity pain: Secondary | ICD-10-CM | POA: Diagnosis not present

## 2024-05-22 DIAGNOSIS — M797 Fibromyalgia: Secondary | ICD-10-CM | POA: Diagnosis not present

## 2024-05-22 DIAGNOSIS — M1120 Other chondrocalcinosis, unspecified site: Secondary | ICD-10-CM | POA: Diagnosis not present

## 2024-05-22 DIAGNOSIS — I82409 Acute embolism and thrombosis of unspecified deep veins of unspecified lower extremity: Secondary | ICD-10-CM | POA: Diagnosis not present

## 2024-05-22 DIAGNOSIS — E039 Hypothyroidism, unspecified: Secondary | ICD-10-CM | POA: Diagnosis not present

## 2024-05-22 DIAGNOSIS — K625 Hemorrhage of anus and rectum: Secondary | ICD-10-CM | POA: Diagnosis not present

## 2024-05-24 ENCOUNTER — Encounter: Payer: Self-pay | Admitting: Adult Health

## 2024-05-24 ENCOUNTER — Other Ambulatory Visit: Payer: Self-pay | Admitting: Family Medicine

## 2024-05-24 ENCOUNTER — Ambulatory Visit (INDEPENDENT_AMBULATORY_CARE_PROVIDER_SITE_OTHER): Payer: Medicare Other | Admitting: Adult Health

## 2024-05-24 VITALS — BP 124/73 | HR 68 | Ht 66.0 in | Wt 166.6 lb

## 2024-05-24 DIAGNOSIS — M797 Fibromyalgia: Secondary | ICD-10-CM

## 2024-05-24 DIAGNOSIS — G250 Essential tremor: Secondary | ICD-10-CM

## 2024-05-24 DIAGNOSIS — R413 Other amnesia: Secondary | ICD-10-CM

## 2024-05-24 DIAGNOSIS — R2689 Other abnormalities of gait and mobility: Secondary | ICD-10-CM | POA: Diagnosis not present

## 2024-05-24 MED ORDER — PRIMIDONE 50 MG PO TABS: 50.0000 mg | ORAL_TABLET | Freq: Every day | ORAL | 3 refills | Status: AC

## 2024-05-24 NOTE — Progress Notes (Signed)
 PATIENT: Mia [name] DOB: 12-04-1950  REASON FOR VISIT: follow up HISTORY FROM: patient  Chief Complaint  Patient presents with   Follow-up    Rm 20, husband.  Doing ok,  (she sais had some dizziness).       HISTORY OF PRESENT ILLNESS: Today 05/24/24:  Mia [name] is a 73 y.o. female with a history of essential tremor, fibromyalgia and memory disturbance. Returns today for follow-up.  She is here today with her husband.  Reports that she had recent back surgery and decompression.  Currently in a brace.  Overall she reports that she has been healing well.  She states in regards to her memory it waxes and wanes.  She has trouble remembering names.  At home she is able to complete all ADLs independently she does fill her pillbox but her husband helps her to ensure that she takes the medication.  Husband does most of the driving.  She states he still has some dizziness.  She did do vestibular rehab and feel that it is helped but since she had back surgery she has not been doing the exercises.  Reports that she has been having more episodes of dizziness.  She feels that her tremor is relatively stable.  Remains on primidone .  Also is on Effexor  for fibromyalgia.  MRI brain 02/2023 IMPRESSION: 1.  No evidence of an acute intracranial abnormality. 2. Minimal chronic small vessel ischemic changes within the cerebral white matter, similar to the prior brain MRI of 09/01/2020.   11/03/23: Mia [name] is a 73 y.o. female with a history of remor, fibromyalgia and memory disturbance. Returns today for follow-up.  Overall she is doing relatively well.  She states that she did go to [facility] and saw ENT and they did testing and told her she had a central processing issue that is affecting her balance.  She is asking that the results be sent to her office.  She was told that she should see neurology.  States that she stumbles quite frequently.  Has only had 1 recent fall.  Fortunately no significant  injuries other than bruising.  She continues to have trouble remembering things that she has been told.  Reports that sometimes she cannot remember if she takes her medications.  Or sometimes she will take her evening medications instead of the morning medications.  She does have a pillbox.  Her husband is there to assist her if needed.  Husband reports that she does not typically drive but does on occasion drive alone.  Patient reports no trouble getting lost while driving.     Blood clot- DVT (August 2024). Off blood thinners today   04/27/23: (copied from Dr. Obie note) 04/27/2023: She reports feeling off balance for at least a year.  She has fallen several times.  She does not typically use a walker but has 2 types of walkers, she has a rolling walker with seat and also a 2 wheeled walker.  She has fallen at night, she is also fallen out of bed.  She now has a bed rail.  She was advised at the last visit in our clinic to use a walker but has not consistently used a walker, typically does not use any walking aid.  She has significant knee issues, including meniscus issues on the left, multiple knee surgeries altogether, needs low back surgery and is actually scheduled for lumbar spine surgery in August 2024 with Dr. Rockey Peru.   She has been on  Mysoline  for tremor particularly head tremor.  She has been on gabapentin  which is currently 300 mg 3 times daily.  She reports that she was on 1200 mg before.  She reports hydrating well.  She drinks about 4-5 tall glasses of water per day.  She drinks 1 cup of coffee in the morning, no alcohol.    She saw her primary care on 02/24/2023 and I reviewed the office note.  She was complaining of dizziness at the time.  She reported an episode of chills in the middle of the night in late April and felt confused the next day.  She felt like she was slurring her speech.  A brain MRI was ordered.  She had a brain MRI with and without contrast on 02/28/2023 and I reviewed  the results:   IMPRESSION: 1.  No evidence of an acute intracranial abnormality. 2. Minimal chronic small vessel ischemic changes within the cerebral white matter, similar to the prior brain MRI of 09/01/2020.   She had a prior brain MRI with and without contrast on 09/01/2020 and I reviewed the results:  IMPRESSION: This MRI of the brain with and without contrast shows the following: 1.   Minimal generalized cortical atrophy, typical for age but slightly progressed compared to the 2016 MRI 2.   Few punctate T2/FLAIR hyperintense foci the subcortical white matter.  This is a nonspecific finding but most likely represents minimal chronic microvascular ischemic change, typical for age. 3.   There is a normal enhancement pattern and no acute findings.     She has been receiving vitamin B12 injections.  Last injection was on 03/01/2023.   Recent laboratory testing from 02/24/2023 was reviewed.  CBC with differential showed mildly elevated platelets at 466 and elevated monocytes.  CMP showed low glucose at 61, BUN 12, creatinine 0.8, chloride 97, borderline below normal.  AST elevated at 36 which is borderline high, ALT mildly high at 52.  Vitamin B12 level 439.  TSH was normal at 1.3.   Of note, she is on multiple medications including potentially sedating medications.  She is currently on primidone  50 mg at bedtime for tremor, she is on Effexor  long-acting 75 mg daily, she takes a beta-blocker per cardiology.  She is on Namenda  generic 10 mg twice daily as well as Aricept  generic 10 mg once daily.  She is on gabapentin  300 mg strength up to 600 mg twice daily.   12/22/22: Mia [name] is a 73 y.o. female with a history of tremor, fibromyalgia and memory disturbance. Returns today for follow-up.    Tremor: Currently taking Primidone  50 mg at bedtime. Has good days and bad days. Definitely has more good days than bad days.   Fibromyalgia: Currently on Effexor  75 mg daily. Has a lot of tenderness in the  legs. Was on Lyrica  for a while and it worked great but then she couldn't tolerate it. Cymbalta increased her blood pressure. She does see Dr. Dolphus who treats her RA.   Memory: Currently on Aricept  10 mg at bedtime and namenda  10 mg BID. Feels that she has good days and bad days. Has trouble with short term things and some long term memory. . Able to complete all ADLs independently. Needs some help with household chores but this is due her back. She does write notes in order to remember.  Manages her medications, appointments, and finances.   Has been having trouble with HTN- PCP is adjusting medication.  Continues to have back pain  but this is currently being managed by [facility]   05/14/22: Ms. Ebel is a 73 year old female with a history of tremor, fibromyalgia and memory disturbance. She returns today for follow-up.    Tremor: Currently taking Primidone  50 mg at bedtime. Seems to be working well. Termor well controlled.    Fibromyalgia: Currently on Effexor  75 mg daily. Has a lot of tenderness but feels effexor  is helpful    Memory: Currently on Aricept  10 mg at bedtime and namenda  10 mg BID. Feels that her memory is worse. Has trouble with short term things. Reports that she forgets what people has told her. Able to complete all ADLs independently. She does write notes in order to remember. May forget to take clothes out of the washer.    Reports that she is having trouble with her balance. Has had several falls. Fortunately no significant injuries. Reports that her BP gets higher in the afternoon and evenings. SBP <180. Going to cardiology and they are aware. Also reports they are monitoring for A.fib.    11/13/2021 Ms. Goldberger is a 73 year old female with a history of memory disturbance, tremor and fibromyalgia.  She returns today for follow-up.  Tremor: Reports it is stable.  Continues on primidone  50 mg at bedtime.  Husband reports that he has not noticed a tremor in the neck like he  used to.  Fibromyalgia: Stable on Effexor .  Memory disturbance: Reports that she sometimes has trouble remembering dates has to use a calendar.  Did not remember a wedding that she was in 18 years ago.  She lives at home with her husband.  Able to complete all ADLs independently.  She manages her own medications and appointments.  Denies any trouble sleeping.  Reports good appetite.  Does report that she will have back surgery January 31 for ruptured disc.  03/04/21: Ms. Grondin is a 73 year old female with a history of memory disturbance, tremor and fibromyalgia.  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  The patient reports today that years ago when she was at [facility] this was also mentioned as a diagnosis.  She has had normal nerve conduction with EMGs in the past.  In regards to her tremor she feels that Mysoline  continues to work fairly well for her.    Fibromyalgia: Continues on Effexor  feels that this works well for her.  In regards to her memory she feels that it has gotten worse since having Covid in January.  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  She is able to complete all ADLs independently.  She has noticed some change in her balance.  States that she recently had a fall and injured her elbow. HISTORY:  Feels that her tremor has been well controlled with Mysoline .  Primarily just affects the hands  Reports that fibromyalgia continues to be fairly controlled with Effexor .  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  She returns today for an evaluation.  HISTORY Ms. Raulerson is a 74 year old right-handed white female with a history of a mild memory disturbance, she is on Aricept  and Namenda .  She contracted the Covid virus and required hospitalization on 06 November 2019.  The patient  has had some increase in fatigue and some increased cognitive slowing since that time.  She has had ongoing pain  in the neck and low back, she is followed through an orthopedic surgeon.  She had a recent MRI of the cervical and lumbar spine done through [facility] in [place].  The results of the studies are not available to me.  The patient does report some gait instability, she has not had any recent falls.  She does not use a cane.  She returns the office today for an evaluation.  She is on very low-dose Mysoline  taking 50 mg at night.  REVIEW OF SYSTEMS: Out of a complete 14 system review of symptoms, the patient complains only of the following symptoms, and all other reviewed systems are negative.  See HPI  ALLERGIES: Allergies  Allergen Reactions   Codeine Nausea And Vomiting    Other Reaction(s): UNKNOWN   Myrbetriq  [Mirabegron] Other (See Comments)    SEVERE HEADACHE   Nystatin Other (See Comments)    Unknown reaction   Percocet Hyde.Hazy ] Nausea And Vomiting   Propoxyphene Nausea And Vomiting    Other Reaction(s): UNKNOWN   Acetaminophen  Other (See Comments)   Erythromycin Base     Other Reaction(s): UNKNOWN   Ketorolac      Other Reaction(s): UNKNOWN   Methadone    Oxycodone-Acetaminophen  Nausea Only   Celecoxib Other (See Comments)    Headache/flushed     Darvocet [Propoxyphene N-Acetaminophen ] Nausea And Vomiting   Erythromycin Nausea And Vomiting    Nausea and vomiting    Hydrocodone  Nausea And Vomiting   Nitrofurantoin Nausea And Vomiting    Other Reaction(s): UNKNOWN   Pregabalin  Swelling    Legs swelling     Toradol  [Ketorolac  Tromethamine ] Nausea And Vomiting    Per patient, only PO form causes nausea and vomiting. Can take injection without issue   Tramadol Nausea And Vomiting   Verapamil Nausea And Vomiting    HOME MEDICATIONS: Outpatient Medications Prior to Visit  Medication Sig Dispense Refill   acetaminophen  (TYLENOL ) 500 MG  tablet Take 1,000 mg by mouth every 6 (six) hours as needed for moderate pain (pain score 4-6) (pain.).     albuterol  (VENTOLIN  HFA) 108 (90 Base) MCG/ACT inhaler Inhale 1-2 puffs into the lungs every 6 (six) hours as needed for shortness of breath or wheezing.     AMBULATORY NON FORMULARY MEDICATION Medication Name: CPAP with humidifier set to 5 to 20 cm of water pressure.  Fullface mask.  Please fax download back to [phone] 3:06 weeks. 1 Units 0   amLODipine  (NORVASC ) 5 MG tablet Take 1.5 tablets (7.5 mg total) by mouth daily. 90 tablet 3   amoxicillin  (AMOXIL ) 500 MG capsule Take 2,000 mg by mouth as needed (BEFORE DENTAL PROCEDURES).     colestipol  (COLESTID ) 1 g tablet Take 1 g by mouth daily at 12 noon.     cyanocobalamin  (VITAMIN B12) 1000 MCG/ML injection Inject 1,000 mcg into the muscle every 28 (twenty-eight) days.     donepezil  (ARICEPT ) 10 MG tablet TAKE 1 TABLET IN THE EVENING AT BEDTIME 90 tablet 3   gabapentin  (NEURONTIN ) 300 MG capsule TAKE 1 CAPSULE BY MOUTH EVERY MORNING AND AT BEDTIME 180 capsule 1   HYDROmorphone  (DILAUDID ) 2 MG tablet Take 0.5-1 tablets (1-2 mg total) by mouth every 4 (four) hours as needed for severe pain (pain score 7-10). 30 tablet 0   ipratropium (ATROVENT ) 0.06 % nasal spray Place 2 sprays into both nostrils 2 (two) times daily as needed for rhinitis.     leflunomide  (ARAVA ) 10 MG tablet Take 10 mg po every other  day alternating with 20 mg po every other day 135 tablet 0   meclizine  (ANTIVERT ) 25 MG tablet Take 1 tablet (25 mg total) by mouth 3 (three) times daily as needed for dizziness. 30 tablet 0   Melatonin 10 MG CAPS Take 10 mg by mouth at bedtime.     memantine  (NAMENDA ) 10 MG tablet TAKE 1 TABLET BY MOUTH TWO TIMES A DAY MUST SEE DR FOR FURTHER REFILLS(Patient taking differently: Take 10 mg by mouth 2 (two) times daily.) 180 tablet 3   methocarbamol  (ROBAXIN ) 500 MG tablet Take 1-2 tablets (500-1,000 mg total) by mouth every 6 (six) hours as  needed for muscle spasms. 60 tablet 0   metoprolol  tartrate (LOPRESSOR ) 100 MG tablet Take 1 tablet (100 mg total) by mouth 2 (two) times daily. 180 tablet 3   pantoprazole  (PROTONIX ) 20 MG tablet Take 20 mg by mouth 2 (two) times daily.     primidone  (MYSOLINE ) 50 MG tablet TAKE 1 TABLET BY MOUTH EVERYDAY AT BEDTIME 90 tablet 3   rosuvastatin  (CRESTOR ) 5 MG tablet Take 1 tablet (5 mg total) by mouth daily. 90 tablet 3   sodium chloride  (MURO 128) 5 % ophthalmic solution Place 1 drop into both eyes in the morning, at noon, in the evening, and at bedtime.     valsartan  (DIOVAN ) 160 MG tablet Take 1 tablet (160 mg total) by mouth 2 (two) times daily. 180 tablet 3   venlafaxine  XR (EFFEXOR -XR) 75 MG 24 hr capsule TAKE 1 CAPSULE DAILY WITH BREAKFAST 90 capsule 3   promethazine -dextromethorphan (PROMETHAZINE -DM) 6.25-15 MG/5ML syrup Take 5 mLs by mouth 4 (four) times daily as needed. 118 mL 0   sucralfate  (CARAFATE ) 1 g tablet Take 1 g by mouth 4 (four) times daily as needed (GI burninh).     No facility-administered medications prior to visit.    PAST MEDICAL HISTORY: Past Medical History:  Diagnosis Date   Anal fissure    Anemia    Arthritis    Blood transfusion    C. difficile colitis    Chest pain    a. 01/2013 MV: EF 59%, no ischemia.   Chronic Dyspnea    a. 01/2013 Echo: EF 60-65%, Gr 1 DD, PASP 62mmHg.// Echo 01/2020: EF 60-65, no RWMA, GR 1 DD, GLS -21.6%, normal RV SF, trivial MR    COVID-19    DDD (degenerative disc disease), cervical    DDD (degenerative disc disease), lumbar    DVT (deep venous thrombosis) (HCC) 06/23/2023   left lower extremity   Fibromyalgia    Gall stones    GERD (gastroesophageal reflux disease)    gastritis   Hemorrhoids    Hypertension    Interstitial cystitis    MCI (mild cognitive impairment) 11/25/2017   Memory difficulty 12/14/2013   Neuromuscular disorder (HCC)    sclerosis   Osteoporosis    Peptic ulcer    PONV (postoperative nausea and  vomiting)    Pseudogout    Raynaud phenomenon    Rectal bleeding    Sleep apnea    a. on cpap.   Stress fracture of left tibia 11/05/2016   SVT (supraventricular tachycardia) (HCC)    Syncope 09/10/2015   Thyroid  disease    hypothyroidism   Tremor, essential 05/14/2016    PAST SURGICAL HISTORY: Past Surgical History:  Procedure Laterality Date   APPENDECTOMY     BLADDER SURGERY     x2   BLADDER SURGERY     BREAST BIOPSY  CARDIAC CATHETERIZATION     CARPAL TUNNEL RELEASE     CATARACT EXTRACTION Bilateral    CHOLECYSTECTOMY     DILATION AND CURETTAGE OF UTERUS     ENTEROCELE REPAIR     x2   EYE SURGERY     retina   herniated disc  11/25/2021   L4 and L5   hysterectomy - unknown type     JOINT REPLACEMENT     KNEE ARTHROPLASTY     KNEE SURGERY     x6   NECK SURGERY     fusion   OOPHORECTOMY     QUADRICEPS REPAIR Right    RECTOCELE REPAIR     x2   SHOULDER ARTHROSCOPY WITH SUBACROMIAL DECOMPRESSION, ROTATOR CUFF REPAIR AND BICEP TENDON REPAIR  10/06/2012   Procedure: SHOULDER ARTHROSCOPY WITH SUBACROMIAL DECOMPRESSION, ROTATOR CUFF REPAIR AND BICEP TENDON REPAIR;  Surgeon: Eva Elsie Herring, MD;  Location: [facility];  Service: Orthopedics;  Laterality: Right;  Arthroscopic  Repair  of  Subscapularis, Open Biceps Tenodesis   SHOULDER SURGERY     bilateral- bones spur   SHOULDER SURGERY Left 04/15/2021   TONSILLECTOMY     TOTAL KNEE ARTHROPLASTY     TOTAL SHOULDER ARTHROPLASTY     TRANSFORAMINAL LUMBAR INTERBODY FUSION (TLIF) WITH PEDICLE SCREW FIXATION 1 LEVEL Left 04/20/2024   Procedure: TRANSFORAMINAL LUMBAR INTERBODY FUSION (TLIF) WITH PEDICLE SCREW FIXATION 1 LEVEL;  Surgeon: Beuford Anes, MD;  Location: [facility];  Service: Orthopedics;  Laterality: Left;  LEFT-SIDED LUMBAR 4 - LUMBAR 5 TRANSFORAMINAL LUMBAR INTERBODY FUSION AND DECOMPRESSION WITH INSTRUMENTATION AND ALLOGRAFT    FAMILY HISTORY: Family History  Problem Relation Age of  Onset   Heart attack Mother    Stroke Mother    Diabetes Mother    Heart disease Mother    Colon polyps Mother    Asthma Mother    Dementia Mother    Alzheimer's disease Mother    Heart disease Father    Heart attack Father    Asthma Father    Stroke Sister    Hypertension Sister    Rheum arthritis Sister    Dementia Sister    Asthma Sister    Parkinson's disease Sister    Lupus Sister    Asthma Sister    Breast cancer Maternal Grandmother    Wilson's disease Maternal Grandmother    Pancreatic cancer Maternal Grandfather    Autoimmune disease Daughter        in eyes   Arthritis Daughter    Deep vein thrombosis Daughter     SOCIAL HISTORY: Social History   Socioeconomic History   Marital status: Married    Spouse name: Norleen Kemps   Number of children: 2   Years of education: 14   Highest education level: Associate degree: academic program  Occupational History   Occupation: Retired  Tobacco Use   Smoking status: Never    Passive exposure: Never   Smokeless tobacco: Never  Vaping Use   Vaping status: Never Used  Substance and Sexual Activity   Alcohol use: No    Alcohol/week: 0.0 standard drinks of alcohol   Drug use: No   Sexual activity: Not on file  Other Topics Concern   Not on file  Social History Narrative   Lives with her husband. Her grand daughter is living with them.  She likes to hang out with her friends and play games and do bible study.   Social Drivers of Dispensing optician  Resource Strain: Low Risk  (05/25/2023)   Overall Financial Resource Strain (CARDIA)    Difficulty of Paying Living Expenses: Not hard at all  Food Insecurity: No Food Insecurity (02/09/2024)   Hunger Vital Sign    Worried About Running Out of Food in the Last Year: Never true    Ran Out of Food in the Last Year: Never true  Transportation Needs: No Transportation Needs (02/09/2024)   PRAPARE - Administrator, Civil Service (Medical): No    Lack of  Transportation (Non-Medical): No  Physical Activity: Inactive (02/09/2024)   Exercise Vital Sign    Days of Exercise per Week: 0 days    Minutes of Exercise per Session: 0 min  Stress: Stress Concern Present (02/09/2024)   [employer] of Occupational Health - Occupational Stress Questionnaire    Feeling of Stress : To some extent  Social Connections: Socially Integrated (05/25/2023)   Social Connection and Isolation Panel    Frequency of Communication with Friends and Family: More than three times a week    Frequency of Social Gatherings with Friends and Family: More than three times a week    Attends Religious Services: More than 4 times per year    Active Member of [name] or Organizations: Yes    Attends Engineer, structural: More than 4 times per year    Marital Status: Married  Catering manager Violence: Not At Risk (02/09/2024)   Humiliation, Afraid, Rape, and Kick questionnaire    Fear of Current or Ex-Partner: No    Emotionally Abused: No    Physically Abused: No    Sexually Abused: No      PHYSICAL EXAM  Vitals:   05/24/24 1037  BP: 124/73  Pulse: 68  Weight: 166 lb 9.6 oz (75.6 kg)  Height: 5' 6 (1.676 m)     Body mass index is 26.89 kg/m.      05/24/2024   10:41 AM 11/03/2023   11:19 AM 12/22/2022    9:01 AM  MMSE - Mini Mental State Exam  Orientation to time 4 5 4   Orientation to Place 5 5 5   Registration 3 3 3   Attention/ Calculation 4 5 1   Recall 2 2 3   Language- name 2 objects 2 2 2   Language- repeat 1 1 1   Language- follow 3 step command 2 3 3   Language- read & follow direction 1 1 1   Write a sentence 1 1 1   Copy design 0 0 0  Total score 25 28 24      Generalized: Well developed, in no acute distress   Neurological examination  Mentation: Alert oriented to time, place, history taking. Follows all commands speech and language fluent Cranial nerve II-XII: Pupils were equal round reactive to light. Extraocular movements were full,  visual field were full on confrontational test.  Dizziness reported with eye movements.  Nystagmus noted with eye movements.  Facial sensation and strength were normal.  Head turning and shoulder shrug  were normal and symmetric. Motor: The motor testing reveals 5 over 5 strength.  Patient has giveaway weakness noted in the left upper extremity.  Good symmetric motor tone is noted throughout.  Sensory: Sensory testing is intact to soft touch on all 4 extremities. No evidence of extinction is noted.   Coordination: Cerebellar testing reveals good finger-nose-finger and heel-to-shin bilaterally.  Gait and station: Gait is unsteady.  Using a walker   tandem gait no attempted.    DIAGNOSTIC DATA (LABS, IMAGING, TESTING) -  I reviewed patient records, labs, notes, testing and imaging myself where available.  Lab Results  Component Value Date   WBC 5.7 04/12/2024   HGB 13.4 04/12/2024   HCT 42.4 04/12/2024   MCV 100.2 (H) 04/12/2024   PLT 250 04/12/2024      Component Value Date/Time   NA 138 04/12/2024 1400   NA 135 01/12/2024 1155   K 4.6 04/12/2024 1400   CL 101 04/12/2024 1400   CO2 29 04/12/2024 1400   GLUCOSE 118 (H) 04/12/2024 1400   BUN 5 (L) 04/12/2024 1400   BUN 8 01/12/2024 1155   CREATININE 0.70 04/12/2024 1400   CREATININE 0.78 11/19/2023 1122   CALCIUM  9.8 04/12/2024 1400   PROT 7.0 01/17/2024 0920   ALBUMIN  4.3 01/17/2024 0920   AST 68 (H) 01/17/2024 0920   ALT 66 (H) 01/17/2024 0920   ALKPHOS 215 (H) 01/17/2024 0920   BILITOT 0.5 01/17/2024 0920   GFRNONAA >60 04/12/2024 1400   GFRNONAA 81 03/10/2021 1008   GFRAA 94 03/10/2021 1008   Lab Results  Component Value Date   CHOL 153 01/17/2024   HDL 66 01/17/2024   LDLCALC 69 01/17/2024   TRIG 100 01/17/2024   CHOLHDL 2.3 01/17/2024   Lab Results  Component Value Date   HGBA1C 5.2 04/17/2024   Lab Results  Component Value Date   VITAMINB12 648 05/16/2024   Lab Results  Component Value Date   TSH 1.30  02/24/2023      ASSESSMENT AND PLAN 73 y.o. year old female  has a past medical history of Anal fissure, Anemia, Arthritis, Blood transfusion, C. difficile colitis, Chest pain, Chronic Dyspnea, COVID-19, DDD (degenerative disc disease), cervical, DDD (degenerative disc disease), lumbar, DVT (deep venous thrombosis) (HCC) (06/23/2023), Fibromyalgia, Gall stones, GERD (gastroesophageal reflux disease), Hemorrhoids, Hypertension, Interstitial cystitis, MCI (mild cognitive impairment) (11/25/2017), Memory difficulty (12/14/2013), Neuromuscular disorder (HCC), Osteoporosis, Peptic ulcer, PONV (postoperative nausea and vomiting), Pseudogout, Raynaud phenomenon, Rectal bleeding, Sleep apnea, Stress fracture of left tibia (11/05/2016), SVT (supraventricular tachycardia) (HCC), Syncope (09/10/2015), Thyroid  disease, and Tremor, essential (05/14/2016). here with :  1. Tremor  - Stable - Continue Mysoline  50 mg daily at bedtime  2.  Fibromyalgia   -Continue Effexor  75 mg daily  3.  Memory disturbance  - MMSE 25/30 previously 28/30 -Continue Namenda  10 mg twice a day -Continue Aricept  10 mg at bedtime  4. Dizziness/ balance disturbance  -Encouraged her to continue the exercises that she learned at vestibular rehab. - Certainly if dizziness continues she should let us  know    FU in 6-8 months or sooner if needed  Duwaine Russell, MSN, NP-C 05/24/2024, 10:42 AM [facility] [address] [place], [place] [address] [phone]  The patient's condition requires frequent monitoring and adjustments in the treatment plan, reflecting the ongoing complexity of care.  This provider is the continuing focal point for all needed services for this condition.

## 2024-05-25 ENCOUNTER — Encounter: Payer: Self-pay | Admitting: Physician Assistant

## 2024-05-25 ENCOUNTER — Other Ambulatory Visit: Payer: Self-pay | Admitting: *Deleted

## 2024-05-25 ENCOUNTER — Ambulatory Visit: Attending: Physician Assistant | Admitting: Physician Assistant

## 2024-05-25 ENCOUNTER — Ambulatory Visit: Admitting: Rheumatology

## 2024-05-25 VITALS — BP 112/74 | HR 57 | Resp 15 | Ht 66.0 in | Wt 168.0 lb

## 2024-05-25 DIAGNOSIS — M1712 Unilateral primary osteoarthritis, left knee: Secondary | ICD-10-CM | POA: Diagnosis not present

## 2024-05-25 DIAGNOSIS — N3010 Interstitial cystitis (chronic) without hematuria: Secondary | ICD-10-CM | POA: Insufficient documentation

## 2024-05-25 DIAGNOSIS — Z8719 Personal history of other diseases of the digestive system: Secondary | ICD-10-CM | POA: Insufficient documentation

## 2024-05-25 DIAGNOSIS — Z8679 Personal history of other diseases of the circulatory system: Secondary | ICD-10-CM | POA: Diagnosis not present

## 2024-05-25 DIAGNOSIS — M5030 Other cervical disc degeneration, unspecified cervical region: Secondary | ICD-10-CM | POA: Insufficient documentation

## 2024-05-25 DIAGNOSIS — M65342 Trigger finger, left ring finger: Secondary | ICD-10-CM | POA: Insufficient documentation

## 2024-05-25 DIAGNOSIS — I7300 Raynaud's syndrome without gangrene: Secondary | ICD-10-CM | POA: Diagnosis not present

## 2024-05-25 DIAGNOSIS — Z9889 Other specified postprocedural states: Secondary | ICD-10-CM | POA: Insufficient documentation

## 2024-05-25 DIAGNOSIS — Z981 Arthrodesis status: Secondary | ICD-10-CM | POA: Insufficient documentation

## 2024-05-25 DIAGNOSIS — E782 Mixed hyperlipidemia: Secondary | ICD-10-CM | POA: Diagnosis not present

## 2024-05-25 DIAGNOSIS — R5382 Chronic fatigue, unspecified: Secondary | ICD-10-CM | POA: Diagnosis not present

## 2024-05-25 DIAGNOSIS — Z4789 Encounter for other orthopedic aftercare: Secondary | ICD-10-CM | POA: Diagnosis not present

## 2024-05-25 DIAGNOSIS — M797 Fibromyalgia: Secondary | ICD-10-CM | POA: Diagnosis not present

## 2024-05-25 DIAGNOSIS — M48061 Spinal stenosis, lumbar region without neurogenic claudication: Secondary | ICD-10-CM | POA: Diagnosis not present

## 2024-05-25 DIAGNOSIS — Z79899 Other long term (current) drug therapy: Secondary | ICD-10-CM | POA: Diagnosis not present

## 2024-05-25 DIAGNOSIS — M51369 Other intervertebral disc degeneration, lumbar region without mention of lumbar back pain or lower extremity pain: Secondary | ICD-10-CM

## 2024-05-25 DIAGNOSIS — Z96651 Presence of right artificial knee joint: Secondary | ICD-10-CM | POA: Diagnosis not present

## 2024-05-25 DIAGNOSIS — M0600 Rheumatoid arthritis without rheumatoid factor, unspecified site: Secondary | ICD-10-CM | POA: Diagnosis not present

## 2024-05-25 DIAGNOSIS — I10 Essential (primary) hypertension: Secondary | ICD-10-CM | POA: Diagnosis not present

## 2024-05-25 DIAGNOSIS — Z8619 Personal history of other infectious and parasitic diseases: Secondary | ICD-10-CM | POA: Diagnosis not present

## 2024-05-25 DIAGNOSIS — M818 Other osteoporosis without current pathological fracture: Secondary | ICD-10-CM | POA: Insufficient documentation

## 2024-05-25 DIAGNOSIS — R7989 Other specified abnormal findings of blood chemistry: Secondary | ICD-10-CM | POA: Diagnosis not present

## 2024-05-25 DIAGNOSIS — M1120 Other chondrocalcinosis, unspecified site: Secondary | ICD-10-CM | POA: Diagnosis not present

## 2024-05-25 DIAGNOSIS — Z8781 Personal history of (healed) traumatic fracture: Secondary | ICD-10-CM | POA: Insufficient documentation

## 2024-05-25 DIAGNOSIS — Z86718 Personal history of other venous thrombosis and embolism: Secondary | ICD-10-CM | POA: Insufficient documentation

## 2024-05-25 DIAGNOSIS — Z5181 Encounter for therapeutic drug level monitoring: Secondary | ICD-10-CM | POA: Diagnosis not present

## 2024-05-25 DIAGNOSIS — M5134 Other intervertebral disc degeneration, thoracic region: Secondary | ICD-10-CM | POA: Diagnosis not present

## 2024-05-25 DIAGNOSIS — I82409 Acute embolism and thrombosis of unspecified deep veins of unspecified lower extremity: Secondary | ICD-10-CM | POA: Diagnosis not present

## 2024-05-25 DIAGNOSIS — A0472 Enterocolitis due to Clostridium difficile, not specified as recurrent: Secondary | ICD-10-CM | POA: Diagnosis not present

## 2024-05-25 DIAGNOSIS — G709 Myoneural disorder, unspecified: Secondary | ICD-10-CM | POA: Diagnosis not present

## 2024-05-25 MED ORDER — LEFLUNOMIDE 10 MG PO TABS: ORAL_TABLET | ORAL | 0 refills | Status: DC

## 2024-05-25 NOTE — Patient Instructions (Signed)
 Standing Labs We placed an order today for your standing lab work.   Please have your standing labs drawn in mid-September and every 3 months  Please have your labs drawn 2 weeks prior to your appointment so that the provider can discuss your lab results at your appointment, if possible.  Please note that you may see your imaging and lab results in MyChart before we have reviewed them. We will contact you once all results are reviewed. Please allow our office up to 72 hours to thoroughly review all of the results before contacting the office for clarification of your results.  WALK-IN LAB HOURS  Monday through Thursday from 8:00 am -12:30 pm and 1:00 pm-4:30 pm and Friday from 8:00 am-12:00 pm.  Patients with office visits requiring labs will be seen before walk-in labs.  You may encounter longer than normal wait times. Please allow additional time. Wait times may be shorter on  Monday and Thursday afternoons.  We do not book appointments for walk-in labs. We appreciate your patience and understanding with our staff.   Labs are drawn by [facility]. Please bring your co-pay at the time of your lab draw.  You may receive a bill from [facility] for your lab work.  Please note if you are on Hydroxychloroquine and and an order has been placed for a Hydroxychloroquine level,  you will need to have it drawn 4 hours or more after your last dose.  If you wish to have your labs drawn at another location, please call the office 24 hours in advance so we can fax the orders.  The office is located at [address], [address], [place], [place] [address]   If you have any questions regarding directions or hours of operation,  please call [phone].   As a reminder, please drink plenty of water prior to coming for your lab work. Thanks!

## 2024-05-25 NOTE — Telephone Encounter (Signed)
Please review prescription and send to the pharmacy. Thanks!

## 2024-05-26 ENCOUNTER — Ambulatory Visit: Payer: Self-pay | Admitting: Family Medicine

## 2024-05-26 DIAGNOSIS — R42 Dizziness and giddiness: Secondary | ICD-10-CM

## 2024-05-26 MED ORDER — MECLIZINE HCL 25 MG PO TABS: 25.0000 mg | ORAL_TABLET | Freq: Three times a day (TID) | ORAL | 0 refills | Status: DC | PRN

## 2024-05-26 NOTE — Telephone Encounter (Signed)
 Patient is wanting meclizine  for the vertigo. Not phenergan . She has had vertigo for a couple of days.

## 2024-05-26 NOTE — Telephone Encounter (Signed)
 FYI Only or Action Required?: Action required by provider: medication refill request.  Patient was last seen in primary care on 05/01/2024 by Mia Mini, NP.  Called Nurse Triage reporting Dizziness.  Symptoms began today.  Interventions attempted: Prescription medications: meclizine .  Symptoms are: unchanged.  Triage Disposition: See PCP Within 2 Weeks  Patient/caregiver understands and will follow disposition?: UnsureCopied from CRM #[id number]. Topic: Clinical - Red Word Triage >> May 26, 2024 10:01 AM Mia [name] wrote: Red Word that prompted transfer to Nurse Triage: Patient is experiencing vertigo for the last week and asking for a refill for promethazine -dextromethorphan (PROMETHAZINE -DM) Reason for Disposition  MILD dizziness is a chronic symptom (recurrent or ongoing AND present > 4 weeks)  Answer Assessment - Initial Assessment Questions Pt saw neurologist 2 days ago  and was told eyes were jumping. Pt has 1-2 day left of Meclizine .  Pt afraid will run out over weekend.    1. DESCRIPTION: Describe your dizziness.     Real woozy 2. VERTIGO: Do you feel like either you or the room is spinning or tilting?      denies 3. LIGHTHEADED: Do you feel lightheaded? (e.g., somewhat faint, woozy, weak upon standing)    Currently no 4. SEVERITY: How bad is it?  Can you walk?     Back surgery recently so hard time walking 5. ONSET:  When did the dizziness begin?     chronic 6. AGGRAVATING FACTORS: Does anything make it worse? (e.g., standing, change in head position)     Walking around 7. CAUSE: What do you think is causing the dizziness?     chronic 9. OTHER SYMPTOMS: Do you have any other symptoms? (e.g., earache, headache, numbness, tinnitus, vomiting, weakness)     Vomiting several days ago due to dizziness  Protocols used: Dizziness - Vertigo-A-AH

## 2024-05-30 ENCOUNTER — Ambulatory Visit: Payer: Self-pay

## 2024-05-30 ENCOUNTER — Ambulatory Visit

## 2024-05-30 VITALS — Ht 66.0 in | Wt 156.0 lb

## 2024-05-30 DIAGNOSIS — Z0000 Encounter for general adult medical examination without abnormal findings: Secondary | ICD-10-CM | POA: Diagnosis not present

## 2024-05-30 NOTE — Progress Notes (Signed)
 Subjective:   Mia [name] is a 73 y.o. female who presents for Medicare Annual (Subsequent) preventive examination.  Visit Complete: Virtual I connected with  Mia [name] on 05/30/24 by a audio enabled telemedicine application and verified that I am speaking with the correct person using two identifiers.  Patient Location: Home  Provider Location: Office/Clinic  I discussed the limitations of evaluation and management by telemedicine. The patient expressed understanding and agreed to proceed.  Vital Signs: Because this visit was a virtual/telehealth visit, some criteria may be missing or patient reported. Any vitals not documented were not able to be obtained and vitals that have been documented are patient reported.  Patient Medicare AWV questionnaire was completed by the patient on n/a; I have confirmed that all information answered by patient is correct and no changes since this date.  Cardiac Risk Factors include: advanced age (>28men, >68 women);diabetes mellitus;hypertension;family history of premature cardiovascular disease;sedentary lifestyle     Objective:    Today's Vitals   05/30/24 0909  Weight: 156 lb (70.8 kg)  Height: 5' 6 (1.676 m)   Body mass index is 25.18 kg/m.     05/30/2024    9:18 AM 04/12/2024    1:21 PM 05/25/2023    9:22 AM 05/25/2022   10:21 AM 05/05/2022    9:02 AM 01/11/2022    8:15 PM 02/26/2021    2:58 PM  Advanced Directives  Does Patient Have a Medical Advance Directive? Yes Yes Yes Yes Yes Yes Yes  Type of Estate agent of [place];Living will Healthcare Power of [place];Living will Living will Healthcare Power of [place];Living will Living will Living will;Healthcare Power of [employer] Power of [place];Living will  Does patient want to make changes to medical advance directive? No - Patient declined  No - Patient declined No - Patient declined No - Patient declined No - Patient declined No - Patient declined   Copy of Healthcare Power of Attorney in Chart? No - copy requested No - copy requested  No - copy requested   No - copy requested    Current Medications (verified)    Allergies (verified) Codeine, Myrbetriq  [mirabegron], Nystatin, Percocet [oxycodone-acetaminophen ], Propoxyphene, Acetaminophen , Erythromycin base, Ketorolac , Methadone, Oxycodone-acetaminophen , Celecoxib, Darvocet [propoxyphene n-acetaminophen ], Erythromycin, Hydrocodone , Nitrofurantoin, Pregabalin , Toradol  [ketorolac  tromethamine ], Tramadol, and Verapamil   History: Past Medical History:  Diagnosis Date   Anal fissure    Anemia    Arthritis    Blood transfusion    C. difficile colitis    Chest pain    a. 01/2013 MV: EF 59%, no ischemia.   Chronic Dyspnea    a. 01/2013 Echo: EF 60-65%, Gr 1 DD, PASP 16mmHg.// Echo 01/2020: EF 60-65, no RWMA, GR 1 DD, GLS -21.6%, normal RV SF, trivial MR    COVID-19    DDD (degenerative disc disease), cervical    DDD (degenerative disc disease), lumbar    DVT (deep venous thrombosis) (HCC) 06/23/2023   left lower extremity   Fibromyalgia    Gall stones    GERD (gastroesophageal reflux disease)    gastritis   Hemorrhoids    Hypertension    Interstitial cystitis    MCI (mild cognitive impairment) 11/25/2017   Memory difficulty 12/14/2013   Neuromuscular disorder (HCC)    sclerosis   Osteoporosis    Peptic ulcer    PONV (postoperative nausea and vomiting)    Pseudogout    Raynaud phenomenon    Rectal bleeding    Sleep apnea  a. on cpap.   Stress fracture of left tibia 11/05/2016   SVT (supraventricular tachycardia) (HCC)    Syncope 09/10/2015   Thyroid  disease    hypothyroidism   Tremor, essential 05/14/2016   Past Surgical History:  Procedure Laterality Date   APPENDECTOMY     BLADDER SURGERY     x2   BLADDER SURGERY     BREAST BIOPSY     CARDIAC CATHETERIZATION     CARPAL TUNNEL RELEASE     CATARACT EXTRACTION Bilateral    CHOLECYSTECTOMY     DILATION AND  CURETTAGE OF UTERUS     ENTEROCELE REPAIR     x2   EYE SURGERY     retina   herniated disc  11/25/2021   L4 and L5   hysterectomy - unknown type     JOINT REPLACEMENT     KNEE ARTHROPLASTY     KNEE SURGERY     x6   NECK SURGERY     fusion   OOPHORECTOMY     QUADRICEPS REPAIR Right    RECTOCELE REPAIR     x2   SHOULDER ARTHROSCOPY WITH SUBACROMIAL DECOMPRESSION, ROTATOR CUFF REPAIR AND BICEP TENDON REPAIR  10/06/2012   Procedure: SHOULDER ARTHROSCOPY WITH SUBACROMIAL DECOMPRESSION, ROTATOR CUFF REPAIR AND BICEP TENDON REPAIR;  Surgeon: Eva Elsie Herring, MD;  Location: [facility];  Service: Orthopedics;  Laterality: Right;  Arthroscopic  Repair  of  Subscapularis, Open Biceps Tenodesis   SHOULDER SURGERY     bilateral- bones spur   SHOULDER SURGERY Left 04/15/2021   TONSILLECTOMY     TOTAL KNEE ARTHROPLASTY     TOTAL SHOULDER ARTHROPLASTY     TRANSFORAMINAL LUMBAR INTERBODY FUSION (TLIF) WITH PEDICLE SCREW FIXATION 1 LEVEL Left 04/20/2024   Procedure: TRANSFORAMINAL LUMBAR INTERBODY FUSION (TLIF) WITH PEDICLE SCREW FIXATION 1 LEVEL;  Surgeon: Beuford Anes, MD;  Location: [facility];  Service: Orthopedics;  Laterality: Left;  LEFT-SIDED LUMBAR 4 - LUMBAR 5 TRANSFORAMINAL LUMBAR INTERBODY FUSION AND DECOMPRESSION WITH INSTRUMENTATION AND ALLOGRAFT   Family History  Problem Relation Age of Onset   Heart attack Mother    Stroke Mother    Diabetes Mother    Heart disease Mother    Colon polyps Mother    Asthma Mother    Dementia Mother    Alzheimer's disease Mother    Heart disease Father    Heart attack Father    Asthma Father    Stroke Sister    Hypertension Sister    Rheum arthritis Sister    Dementia Sister    Asthma Sister    Parkinson's disease Sister    Lupus Sister    Asthma Sister    Breast cancer Maternal Grandmother    Wilson's disease Maternal Grandmother    Pancreatic cancer Maternal Grandfather    Autoimmune disease Daughter        in  eyes   Arthritis Daughter    Deep vein thrombosis Daughter    Social History   Socioeconomic History   Marital status: Married    Spouse name: Mia [name]   Number of children: 2   Years of education: 14   Highest education level: Associate degree: academic program  Occupational History   Occupation: Retired  Tobacco Use   Smoking status: Never    Passive exposure: Past   Smokeless tobacco: Never  Vaping Use   Vaping status: Never Used  Substance and Sexual Activity   Alcohol use: No    Alcohol/week: 0.0  standard drinks of alcohol   Drug use: No   Sexual activity: Not on file  Other Topics Concern   Not on file  Social History Narrative   Lives with her husband. Her grand daughter is living with them.  She likes to hang out with her friends and play games and do bible study.   Social Drivers of Corporate investment banker Strain: Low Risk  (05/30/2024)   Overall Financial Resource Strain (CARDIA)    Difficulty of Paying Living Expenses: Not hard at all  Food Insecurity: No Food Insecurity (05/30/2024)   Hunger Vital Sign    Worried About Running Out of Food in the Last Year: Never true    Ran Out of Food in the Last Year: Never true  Transportation Needs: No Transportation Needs (05/30/2024)   PRAPARE - Administrator, Civil Service (Medical): No    Lack of Transportation (Non-Medical): No  Physical Activity: Inactive (05/30/2024)   Exercise Vital Sign    Days of Exercise per Week: 0 days    Minutes of Exercise per Session: 0 min  Stress: No Stress Concern Present (05/30/2024)   [employer] of Occupational Health - Occupational Stress Questionnaire    Feeling of Stress: Not at all  Social Connections: Socially Integrated (05/30/2024)   Social Connection and Isolation Panel    Frequency of Communication with Friends and Family: More than three times a week    Frequency of Social Gatherings with Friends and Family: More than three times a week    Attends  Religious Services: More than 4 times per year    Active Member of [name] or Organizations: Yes    Attends Engineer, structural: More than 4 times per year    Marital Status: Married    Tobacco Counseling Counseling given: Not Answered   Clinical Intake:  Pre-visit preparation completed: Yes  Pain : No/denies pain     BMI - recorded: 25.18 Nutritional Status: BMI 25 -29 Overweight Nutritional Risks: None Diabetes: No  How often do you need to have someone help you when you read instructions, pamphlets, or other written materials from your doctor or pharmacy?: 1 - Never What is the last grade level you completed in school?: 14  Interpreter Needed?: No      Activities of Daily Living    05/30/2024    9:10 AM 04/12/2024    1:32 PM  In your present state of health, do you have any difficulty performing the following activities:  Hearing? 0   Vision? 0   Difficulty concentrating or making decisions? 0   Walking or climbing stairs? 1   Dressing or bathing? 0   Doing errands, shopping? 0 0  Preparing Food and eating ? N   Using the Toilet? N   In the past six months, have you accidently leaked urine? Y   Do you have problems with loss of bowel control? N   Managing your Medications? N   Managing your Finances? N   Housekeeping or managing your Housekeeping? N     Patient Care Team: Alvan Dorothyann BIRCH, MD as PCP - General (Family Medicine) Curtis Debby PARAS, MD as Consulting Physician (Sports Medicine) Dolphus Reiter, MD as Consulting Physician (Rheumatology) Dr. francis Bull as Referring Physician (Neurology) Daubert, Merrell NOVAK, MD as Consulting Physician (Orthopedic Surgery) Jefrey Bruckner, MD as Referring Physician (Internal Medicine) Elspeth Lauraine DEL, OD as Referring Physician (Optometry) Alvan Dorothyann BIRCH, MD as Consulting Physician (Family Medicine) Mia [name], Mia  CHRISTELLA [name] (Cardiology)  Indicate any recent Medical Services you may have  received from other than [facility] providers in the past year (date may be approximate).     Assessment:   This is a routine wellness examination for Mia [name].  Hearing/Vision screen No results found.   Goals Addressed             This Visit's Progress    Patient Stated       Patient states she is recovering from back surgery. She would like to get back to being active.        Depression Screen    05/30/2024    9:17 AM 02/09/2024   10:27 AM 01/12/2024   11:22 AM 05/25/2023    9:23 AM 05/18/2023   10:54 AM 01/12/2023    1:13 PM 01/05/2023    1:35 PM  PHQ 2/9 Scores  PHQ - 2 Score 0 0 0 0 0 0 0    Fall Risk    05/30/2024    9:18 AM 01/12/2024   11:21 AM 05/25/2023    9:23 AM 05/18/2023   10:55 AM 01/12/2023    1:13 PM  Fall Risk   Falls in the past year? 1 1 1 1 1   Number falls in past yr: 1 1 1 1 1   Injury with Fall? 0 1 1 1  0  Risk for fall due to : Impaired mobility;Impaired balance/gait History of fall(s) History of fall(s);Impaired balance/gait;Impaired mobility History of fall(s) History of fall(s)  Follow up Education provided Falls evaluation completed Falls evaluation completed;Education provided;Falls prevention discussed Falls evaluation completed Falls evaluation completed    MEDICARE RISK AT HOME: Medicare Risk at Home Any stairs in or around the home?: No Home free of loose throw rugs in walkways, pet beds, electrical cords, etc?: Yes Adequate lighting in your home to reduce risk of falls?: Yes Life alert?: No Use of a cane, walker or w/c?: No Grab bars in the bathroom?: Yes Shower chair or bench in shower?: Yes Elevated toilet seat or a handicapped toilet?: Yes  TIMED UP AND GO:  Was the test performed?  No    Cognitive Function:    05/24/2024   10:41 AM 11/03/2023   11:19 AM 12/22/2022    9:01 AM 11/13/2021    2:01 PM 03/04/2021   10:26 AM  MMSE - Mini Mental State Exam  Orientation to time 4 5 4 5 2   Orientation to Place 5 5 5 5 5   Registration 3 3 3 3  3   Attention/ Calculation 4 5 1 4 5   Recall 2 2 3 2 2   Language- name 2 objects 2 2 2 2 2   Language- repeat 1 1 1 1 1   Language- follow 3 step command 2 3 3 3 3   Language- read & follow direction 1 1 1 1 1   Write a sentence 1 1 1 1 1   Copy design 0 0 0 1 1  Total score 25 28 24 28 26         05/30/2024    9:20 AM 05/25/2023    9:39 AM 05/05/2022    9:09 AM 02/26/2021    3:04 PM  6CIT Screen  What Year? 0 points 0 points 0 points 0 points  What month? 0 points 0 points 0 points 0 points  What time? 0 points 0 points 0 points 0 points  Count back from 20 0 points 0 points 0 points 0 points  Months in reverse 0 points 0  points 0 points 0 points  Repeat phrase 2 points 0 points 2 points 0 points  Total Score 2 points 0 points 2 points 0 points    Immunizations Immunization History  Administered Date(s) Administered   Fluad Quad(high Dose 65+) 07/13/2019, 07/10/2020, 07/22/2021, 07/22/2022   Fluad Trivalent(High Dose 65+) 07/26/2023   Hepatitis B, ADULT 02/25/2021, 03/28/2021, 08/26/2021   Influenza Split 07/26/2012, 06/11/2014, 07/25/2015, 06/23/2018   Influenza Whole 07/30/2009, 08/19/2010   Influenza, High Dose Seasonal PF 07/06/2016, 07/05/2017   Influenza,inj,Quad PF,6+ Mos 06/11/2014, 07/25/2015, 06/23/2018   Influenza-Unspecified 07/26/2012, 07/26/2013, 09/05/2013, 06/11/2014, 07/25/2015, 06/23/2018, 06/27/2019, 07/13/2019, 07/10/2020, 07/22/2021   Moderna Sars-Covid-2 Vaccination 02/29/2020, 03/28/2020, 10/04/2020   PNEUMOCOCCAL CONJUGATE-20 05/16/2024   Pneumococcal Conjugate-13 01/04/2017   Pneumococcal Polysaccharide-23 07/30/2009, 11/14/2018   Pneumococcal-Unspecified 10/27/2015   Tdap 02/13/2008, 05/12/2018   Zoster Recombinant(Shingrix) 02/23/2022, 05/05/2022   Zoster, Live 09/01/2011    TDAP status: Up to date  Flu Vaccine status: Up to date  Pneumococcal vaccine status: Up to date  Covid-19 vaccine status: Declined, Education has been provided regarding the  importance of this vaccine but patient still declined. Advised may receive this vaccine at local pharmacy or Health Dept.or vaccine clinic. Aware to provide a copy of the vaccination record if obtained from local pharmacy or Health Dept. Verbalized acceptance and understanding.  Qualifies for Shingles Vaccine? Yes   Zostavax completed Yes   Shingrix Completed?: Yes  Screening Tests Health Maintenance  Topic Date Due   COVID-19 Vaccine (4 - 2024-25 season) 06/27/2023   INFLUENZA VACCINE  05/26/2024   Medicare Annual Wellness (AWV)  05/30/2025   MAMMOGRAM  07/13/2025   DEXA SCAN  08/24/2025   DTaP/Tdap/Td (3 - Td or Tdap) 05/12/2028   Colonoscopy  03/31/2032   Pneumococcal Vaccine: 50+ Years  Completed   Hepatitis B Vaccines  Completed   Hepatitis C Screening  Completed   Zoster Vaccines- Shingrix  Completed   HPV VACCINES  Aged Out   Meningococcal B Vaccine  Aged Out    Health Maintenance  Health Maintenance Due  Topic Date Due   COVID-19 Vaccine (4 - 2024-25 season) 06/27/2023   INFLUENZA VACCINE  05/26/2024    Colorectal cancer screening: Type of screening: Colonoscopy. Completed 03/31/2022. Repeat every 10 years  Mammogram status: Completed 07/14/2023. Repeat every year  Bone Density status: Completed 08/25/2023. Results reflect: Bone density results: OSTEOPENIA. Repeat every 2 years.  Lung Cancer Screening: (Low Dose CT Chest recommended if Age 77-80 years, 20 pack-year currently smoking OR have quit w/in 15years.) does not qualify.   Lung Cancer Screening Referral: n/a  Additional Screening:  Hepatitis C Screening: does qualify; Completed 12/21/2014  Vision Screening: Recommended annual ophthalmology exams for early detection of glaucoma and other disorders of the eye. Is the patient up to date with their annual eye exam?  Yes  Who is the provider or what is the name of the office in which the patient attends annual eye exams? Dr Leveda If pt is not established with a  provider, would they like to be referred to a provider to establish care? N/a.   Dental Screening: Recommended annual dental exams for proper oral hygiene   Community Resource Referral / Chronic Care Management: CRR required this visit?  No   CCM required this visit?  No     Plan:     I have personally reviewed and noted the following in the patient's chart:   Medical and social history Use of alcohol, tobacco or illicit drugs  Current medications and supplements including opioid prescriptions. Patient is not currently taking opioid prescriptions. Functional ability and status Nutritional status Physical activity Advanced directives List of other physicians Hospitalizations # 0, surgeries # 1, and ER # 0 visits in previous 12 months Vitals Screenings to include cognitive, depression, and falls Referrals and appointments  In addition, I have reviewed and discussed with patient certain preventive protocols, quality metrics, and best practice recommendations. A written personalized care plan for preventive services as well as general preventive health recommendations were provided to patient.     Bonny Jon Mayor, CMA   05/30/2024   After Visit Summary: (MyChart) Due to this being a telephonic visit, the after visit summary with patients personalized plan was offered to patient via MyChart   Nurse Notes:   Mia [name] is a 73 y.o. female patient of Metheney, Dorothyann BIRCH, MD who had a Medicare Annual Wellness Visit today. Mia [name] is Retired and lives with their spouse. she has 2 children. She reports that she is socially active and does interact with friends/family regularly. She is moderately physically active and enjoys  to hang out with her friends and play games and do bible study.

## 2024-05-30 NOTE — Patient Instructions (Signed)
 [name] , Thank you for taking time to come for your Medicare Wellness Visit. I appreciate your ongoing commitment to your health goals. Please review the following plan we discussed and let me know if I can assist you in the future.   These are the goals we discussed:  Goals       Medication Management      Patient Goals/Self-Care Activities Over the next 365 days, patient will:  take medications as prescribed  Follow Up Plan: Telephone follow up appointment with care management team member scheduled for:  8-12 months       Patient Stated (pt-stated)      02/26/2021 AWV Goal: Fall Prevention  Over the next year, patient will decrease their risk for falls by: Using assistive devices, such as a cane or walker, as needed Identifying fall risks within their home and correcting them by: Removing throw rugs Adding handrails to stairs or ramps Removing clutter and keeping a clear pathway throughout the home Increasing light, especially at night Adding shower handles/bars Raising toilet seat Identifying potential personal risk factors for falls: Medication side effects Incontinence/urgency Vestibular dysfunction Hearing loss Musculoskeletal disorders Neurological disorders Orthostatic hypotension        Patient Stated (pt-stated)      Would like to loose 15 lbs.      Patient Stated (pt-stated)      Patient stated that she would like to loose weight and her work on her balance.       Patient Stated      Patient states she is recovering from back surgery. She would like to get back to being active.       Patient Stated she has periodic falls. No serious injuries. She gets dizzy occasionally. Left knee pain      Interventions: Spoke with client via phone today about client needs and status.  Client said she had received care for one week at hospital in [place], [place]. She said she had just returned home today from the hospital in [place], [place] She has support of her spouse  and other family members Client is at risk for falls. She has cane, wheelchair and a walker to use as needed to help her walk. She said she sometimes gets dizzy when walking Client has support of PCP, Dr. Dorothyann Byars. Used Active Listening skills to hear client needs Discussed program support with RN, LCSW, Pharmacist. Discussed pain issues of client. Client spoke of back pain issue. She spoke of knee pain issue.  Provided counseling support for client Client has transport support from her spouse and from other family members as needed.   Thanked client for phone call today with LCSW Encouraged client or spouse to call LCSW as needed for SW support for client.at [phone]        Patient Stated she wanted to talk with PCP about program support      Interventions:  Spoke with client via phone today about  her current status and needs faced Discussed program support with client. Described roles of RN, LCSW, Pharmacist in program support Discussed PCP support of client.  Client sees Dr. Dorothyann Byars as PCP. Encouraged client to talk with PCP about program support Discussed medication procurement of client . Discussed appetite of client. Discussed sleeping issues of client Discussed pain issues of client. She said she has occasional knee pain issue Client resides with her spouse. She likes to play game for relaxation. She enjoys going to [employer] client for  phone call with LCSW Gave client LCSW name and phone number.  Encouraged Devere to call LCSW as needed to discuss program support      [facility] Social Work Care Plan      Problems:  Managing knee pain issues on right knee. Finding out cause of right knee pain Allow time for ADLs completion Walks with use of walker as needed  CSW Clinical Goal(s):   Over the next 30 days , Laressa will attend scheduled medical appointment AEB patient report and EPIC record documentation                Over next 30 days,  patient will practice self care AEB allowing time to rest, eat meals on schedule, take medications as prescribed, socializing with friends to help her relax.  Interventions:             Discussed medication procurement of client             Discussed family support for client. Her spouse is support. Her family is supportive             Discussed transport needs of client. She has support from spouse with transport needs             Discussed pain issues of client . She spoke of right knee pain issues                              Discussed upcoming client medical appointments. She has appointment tomorrow with Orthopedist to discuss right knee pain issues          Discussed vision of client; discussed sleeping issues of client. Discussed appetite of client          Discussed mood of client. She feels that her mood is stable at present          Discussed memory issues. She uses calendar to help her remember her appointments. She writes notes to self to remind her of activities to complete           Discussed ADLs completion.           Discussed support with PCP, Dr. Alvan.           Provided counseling support for client           Encouraged client to call LCSW as needed for SW support at [phone]  Patient Goals/Self-Care Activities:  Continue taking your medication as prescribed.               Attend scheduled medical appointments in next 30 days             Get adequate rest, eat meals as scheduled, spend time socializing with friends to help you relax and manage stress issues             Communicate as needed with medical providers to help her manage her medical needs  Plan:   The patient has been provided with contact information for the care management team and has been advised to call with any health related questions or concerns.         This is a list of the screening recommended for you and due dates:  Health Maintenance  Topic Date Due   COVID-19 Vaccine (4 - 2024-25  season) 06/27/2023   Flu Shot  05/26/2024   Medicare Annual Wellness Visit  05/30/2025   Mammogram  07/13/2025   DEXA scan (  bone density measurement)  08/24/2025   DTaP/Tdap/Td vaccine (3 - Td or Tdap) 05/12/2028   Colon Cancer Screening  03/31/2032   Pneumococcal Vaccine for age over 23  Completed   Hepatitis B Vaccine  Completed   Hepatitis C Screening  Completed   Zoster (Shingles) Vaccine  Completed   HPV Vaccine  Aged Out   Meningitis B Vaccine  Aged Out

## 2024-05-30 NOTE — Telephone Encounter (Signed)
 Patient scheduled for 06/01/2024 bu t note states patient wanting to talk with Dr. Alvan asap -forwarding for review.

## 2024-05-30 NOTE — Telephone Encounter (Signed)
 FYI Only or Action Required?: FYI only for provider.  Patient was last seen in primary care on 05/01/2024 by Willo Mini, NP.  Called Nurse Triage reporting Dizziness.  Symptoms began today.  Interventions attempted: Prescription medications: meclizine .  Symptoms are: gradually worsening.  Triage Disposition: See PCP When Office is Open (Within 3 Days)  Patient/caregiver understands and will follow disposition?: Yes   States her eye doctor told her that her eyes were jumping.     Copied from CRM [id number]. Topic: Clinical - Medical Advice >> May 30, 2024  1:59 PM Carmell SAUNDERS wrote: Reason for CRM: Pt was triaged on 8/1 for vertigo. She feels that her attacks are recurring more often than normal and she is not sure if she is taking her medication, Meclizine , correctly. Would like to speak with Dr. Alvan for advice about this as soon as possible. [phone] Reason for Disposition  [1] MODERATE dizziness (e.g., interferes with normal activities) AND [2] has been evaluated by doctor (or NP/PA) for this  Answer Assessment - Initial Assessment Questions 1. DESCRIPTION: Describe your dizziness.     lightheaded 2. LIGHTHEADED: Do you feel lightheaded? (e.g., somewhat faint, woozy, weak upon standing)     Weak upon standing and can't get her balance, staggering when walking. And hard time focusing on reading.  3. VERTIGO: Do you feel like either you or the room is spinning or tilting? (i.e., vertigo)     no 4. SEVERITY: How bad is it?  Do you feel like you are going to faint? Can you stand and walk?    Can walk but stagger 5. ONSET:  When did the dizziness begin?     This morning 6. AGGRAVATING FACTORS: Does anything make it worse? (e.g., standing, change in head position)     Yes change in head position 7. HEART RATE: Can you tell me your heart rate? How many beats in 15 seconds?  (Note: Not all patients can do this.)       *No Answer* 8. CAUSE: What do you think  is causing the dizziness? (e.g., decreased fluids or food, diarrhea, emotional distress, heat exposure, new medicine, sudden standing, vomiting; unknown)     *No Answer* 9. RECURRENT SYMPTOM: Have you had dizziness before? If Yes, ask: When was the last time? What happened that time?     Yes off and on for a while but more often lately 10. OTHER SYMPTOMS: Do you have any other symptoms? (e.g., fever, chest pain, vomiting, diarrhea, bleeding)       Blurred vision when she is the dizzy spells  Protocols used: Dizziness - Lightheadedness-A-AH

## 2024-05-31 NOTE — Telephone Encounter (Signed)
 Pt would like to know how long she can take meclizine . She would also like to know what she can do to prevent vertigo.   Pt is scheduled to see Dr. Alvan tomorrow to discuss these concerns.

## 2024-05-31 NOTE — Telephone Encounter (Signed)
 Ok to take meclizine  25mg  up to 3 x a day as needed for vertigo.

## 2024-06-01 ENCOUNTER — Encounter: Payer: Self-pay | Admitting: Family Medicine

## 2024-06-01 ENCOUNTER — Ambulatory Visit (INDEPENDENT_AMBULATORY_CARE_PROVIDER_SITE_OTHER): Admitting: Family Medicine

## 2024-06-01 VITALS — BP 137/66 | HR 55 | Wt 137.1 lb

## 2024-06-01 DIAGNOSIS — G4733 Obstructive sleep apnea (adult) (pediatric): Secondary | ICD-10-CM

## 2024-06-01 DIAGNOSIS — R42 Dizziness and giddiness: Secondary | ICD-10-CM

## 2024-06-01 MED ORDER — MECLIZINE HCL 25 MG PO TABS: 25.0000 mg | ORAL_TABLET | Freq: Three times a day (TID) | ORAL | 0 refills | Status: AC | PRN

## 2024-06-01 NOTE — Progress Notes (Addendum)
 Acute Office Visit  Subjective:     Patient ID: Mia [name], female    DOB: Dec 09, 1950, 73 y.o.   MRN: [id number]  Chief Complaint  Patient presents with   Medical Management of Chronic Issues    HPI Patient is in today for a couple of concerns.  In particular she is just felt off balance she says is not so much of spinning sensation.  But it is made it hard to focus.  It was pretty bad the other day when she was just trying to look at her printer and she literally could not focus well enough to actually read the message on the printer.  Sometimes it is not so bad that she cannot read something.  She has previously done vestibular rehab but is concerned that her symptoms of come back she is wondering if a chiropractor could be helpful she also wants a little bit more instruction on the safety and of the meclizine  and how often to use it and whether or not it should be used preventatively or just as needed.  She is still having some weakness in her left thigh from a muscle tear.  She also recently had back surgery and is still in her brace she actually follows up with Dr. Beuford tomorrow so hopefully they will be able to take off the upper part of the brace.  He is here today with her husband.  ROS      Objective:    BP 137/66   Pulse (!) 55   Wt 137 lb 1.3 oz (62.2 kg)   SpO2 98%   BMI 22.13 kg/m    Physical Exam Vitals reviewed.  Constitutional:      Appearance: Normal appearance.  HENT:     Head: Normocephalic.  Pulmonary:     Effort: Pulmonary effort is normal.  Neurological:     Mental Status: She is alert and oriented to person, place, and time.  Psychiatric:        Mood and Affect: Mood normal.        Behavior: Behavior normal.    Since she was in a back brace I did not do Dix-Hallpike maneuver on her. No results found for any visits on 06/01/24.      Assessment & Plan:   Problem List Items Addressed This Visit       Respiratory   OSA  (obstructive sleep apnea)   I was able to get a compliance report from July 9 to August 24 for her CPAP.  She did wear her CPAP for greater than 4 hours 83% of the time for 39 days and less than 4 hours for 15% of the time.  Her her AHI is 23.5 which is elevated so we may need to make an adjustment to her pressure.  Currently set at 12 cm of water pressure.  Will send over new order to increase pressure to 14.      Relevant Medications   AMBULATORY NON FORMULARY MEDICATION   Other Visit Diagnoses       Vertigo    -  Primary   Relevant Medications   meclizine  (ANTIVERT ) 25 MG tablet   Other Relevant Orders   Ambulatory referral to Physical Therapy       Discussed some different options I really think she would benefit from going back to vestibular rehab at least for a few sessions to see if it is helpful to get her out of her acute episode we did  discuss the nature of vertigo and that often times it will recur and can vary in intensity and severity even from day-to-day.  She did mention her the vertigo to her neurologist so they are aware as well.  We gave some instruction around the meclizine  and it really is as needed for symptom relief it really does not provide a lot of preventative benefit.  For now she is using her walker to make sure that she does not fall to keep her balance better.  Health follows up with Dr. Beuford tomorrow for her back.  Meds ordered this encounter  Medications   meclizine  (ANTIVERT ) 25 MG tablet    Sig: Take 1 tablet (25 mg total) by mouth 3 (three) times daily as needed for dizziness.    Dispense:  60 tablet    Refill:  0   AMBULATORY NON FORMULARY MEDICATION    Sig: Medication Name: CPAP with humidifier set 14 cm H2o pressure.  Fullface mask. Need repeat download in 2 weeks to [phone]    Dispense:  1 Units    Refill:  0    No follow-ups on file.  I spent 30 minutes on the day of the encounter to include pre-visit record review, face-to-face  time with the patient and post visit ordering of test.   Dorothyann Byars, MD

## 2024-06-01 NOTE — Progress Notes (Signed)
 Pt would like to discuss how to take the Meclizine  for vertigo.

## 2024-06-02 DIAGNOSIS — M545 Low back pain, unspecified: Secondary | ICD-10-CM | POA: Diagnosis not present

## 2024-06-09 ENCOUNTER — Ambulatory Visit: Attending: Family Medicine | Admitting: Physical Therapy

## 2024-06-09 ENCOUNTER — Encounter: Payer: Self-pay | Admitting: Physical Therapy

## 2024-06-09 ENCOUNTER — Other Ambulatory Visit: Payer: Self-pay

## 2024-06-09 DIAGNOSIS — R42 Dizziness and giddiness: Secondary | ICD-10-CM | POA: Insufficient documentation

## 2024-06-09 NOTE — Therapy (Signed)
 OUTPATIENT PHYSICAL THERAPY VESTIBULAR EVALUATION     Patient Name: Mia [name] MRN: [id number] DOB:1950-11-25, 73 y.o., female Today's Date: 06/09/2024  END OF SESSION:  PT End of Session - 06/09/24 0927     Visit Number 1    Number of Visits 8    Date for PT Re-Evaluation 08/04/24    Authorization Type medicare    Progress Note Due on Visit 10    PT Start Time 0845    PT Stop Time 0925    PT Time Calculation (min) 40 min    Activity Tolerance Patient tolerated treatment well    Behavior During Therapy [facility] for tasks assessed/performed          Past Medical History:  Diagnosis Date   Anal fissure    Anemia    Arthritis    Blood transfusion    C. difficile colitis    Chest pain    a. 01/2013 MV: EF 59%, no ischemia.   Chronic Dyspnea    a. 01/2013 Echo: EF 60-65%, Gr 1 DD, PASP 79mmHg.// Echo 01/2020: EF 60-65, no RWMA, GR 1 DD, GLS -21.6%, normal RV SF, trivial MR    COVID-19    DDD (degenerative disc disease), cervical    DDD (degenerative disc disease), lumbar    DVT (deep venous thrombosis) (HCC) 06/23/2023   left lower extremity   Fibromyalgia    Gall stones    GERD (gastroesophageal reflux disease)    gastritis   Hemorrhoids    Hypertension    Interstitial cystitis    MCI (mild cognitive impairment) 11/25/2017   Memory difficulty 12/14/2013   Neuromuscular disorder (HCC)    sclerosis   Osteoporosis    Peptic ulcer    PONV (postoperative nausea and vomiting)    Pseudogout    Raynaud phenomenon    Rectal bleeding    Sleep apnea    a. on cpap.   Stress fracture of left tibia 11/05/2016   SVT (supraventricular tachycardia) (HCC)    Syncope 09/10/2015   Thyroid  disease    hypothyroidism   Tremor, essential 05/14/2016   Past Surgical History:  Procedure Laterality Date   APPENDECTOMY     BLADDER SURGERY     x2   BLADDER SURGERY     BREAST BIOPSY     CARDIAC CATHETERIZATION     CARPAL TUNNEL RELEASE     CATARACT EXTRACTION Bilateral     CHOLECYSTECTOMY     DILATION AND CURETTAGE OF UTERUS     ENTEROCELE REPAIR     x2   EYE SURGERY     retina   herniated disc  11/25/2021   L4 and L5   hysterectomy - unknown type     JOINT REPLACEMENT     KNEE ARTHROPLASTY     KNEE SURGERY     x6   NECK SURGERY     fusion   OOPHORECTOMY     QUADRICEPS REPAIR Right    RECTOCELE REPAIR     x2   SHOULDER ARTHROSCOPY WITH SUBACROMIAL DECOMPRESSION, ROTATOR CUFF REPAIR AND BICEP TENDON REPAIR  10/06/2012   Procedure: SHOULDER ARTHROSCOPY WITH SUBACROMIAL DECOMPRESSION, ROTATOR CUFF REPAIR AND BICEP TENDON REPAIR;  Surgeon: Eva Elsie Herring, MD;  Location: [facility];  Service: Orthopedics;  Laterality: Right;  Arthroscopic  Repair  of  Subscapularis, Open Biceps Tenodesis   SHOULDER SURGERY     bilateral- bones spur   SHOULDER SURGERY Left 04/15/2021   TONSILLECTOMY  TOTAL KNEE ARTHROPLASTY     TOTAL SHOULDER ARTHROPLASTY     TRANSFORAMINAL LUMBAR INTERBODY FUSION (TLIF) WITH PEDICLE SCREW FIXATION 1 LEVEL Left 04/20/2024   Procedure: TRANSFORAMINAL LUMBAR INTERBODY FUSION (TLIF) WITH PEDICLE SCREW FIXATION 1 LEVEL;  Surgeon: Beuford Anes, MD;  Location: [facility];  Service: Orthopedics;  Laterality: Left;  LEFT-SIDED LUMBAR 4 - LUMBAR 5 TRANSFORAMINAL LUMBAR INTERBODY FUSION AND DECOMPRESSION WITH INSTRUMENTATION AND ALLOGRAFT   Patient Active Problem List   Diagnosis Date Noted   Radiculopathy 04/20/2024   History of DVT of lower extremity 08/23/2023   Pain of right sacroiliac joint 10/15/2022   Palpitations 06/02/2022   Pernicious anemia 09/11/2020   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   Weakness of both lower extremities 06/11/2020   Hyponatremia 05/22/2020   Gait instability 06/20/2019   Facet arthritis of lumbar region 05/10/2018   MCI (mild cognitive impairment) 11/25/2017   Restrictive lung disease 07/30/2017   Mixed hyperlipidemia 06/18/2017   Rectocele 04/26/2017   Irritable bowel syndrome with both  constipation and diarrhea 04/26/2017   Osteoporosis 04/18/2017   Trochanteric bursitis of both hips 02/24/2017   Inflammatory arthritis 09/30/2016   High risk medication use 09/30/2016   History of Clostridium difficile colitis 09/30/2016   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   Pseudogout 09/30/2016   DDD cervical spine status post fusion 09/30/2016   DDD thoracic spine 09/30/2016   H/O total knee replacement, right 09/30/2016   Age-related osteoporosis without current pathological fracture 09/30/2016   Tremor, essential 05/14/2016   Right foot pain 04/14/2016   B12 deficiency 09/10/2015   Cervical facet joint syndrome 09/10/2015   Chronic fatigue 09/10/2015   Aortic atherosclerosis (HCC) 04/01/2015   DOE (dyspnea on exertion)    Primary osteoarthritis of right knee 08/13/2014   Benign head tremor 06/11/2014   Lumbar degenerative disc disease 06/11/2014   IFG (impaired fasting glucose) 03/09/2014   Hemorrhoid 03/09/2014   Retinal wrinkling, right eye 03/09/2014   Fibromyalgia 03/09/2014   GERD (gastroesophageal reflux disease) 03/09/2014   History of arthroplasty of right knee 01/31/2014   Hemorrhoids, external, thrombosed 06/22/2011   Hypertension 03/11/2011   SVT (supraventricular tachycardia) (HCC)    Raynaud phenomenon    Nonspecific (abnormal) findings on radiological and other examination of body structure 08/25/2010   COMPUTERIZED TOMOGRAPHY, CHEST, ABNORMAL 08/25/2010   OSA (obstructive sleep apnea) 04/24/2009   Allergic rhinitis 06/11/2008   Dyspnea 06/11/2008   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   Chronic diastolic CHF (congestive heart failure) (HCC) 06/08/2008   INTERSTITIAL CYSTITIS 06/08/2008    PCP: Alvan REFERRING PROVIDER: Metheney  REFERRING DIAG: vertigo  THERAPY DIAG:  Dizziness and giddiness  ONSET DATE: 04/20/24  Rationale for Evaluation and Treatment: Rehabilitation  SUBJECTIVE:   SUBJECTIVE STATEMENT: Pt has  c/o wooziness that comes and goes. She states some days it is ok and some days it is bad. She states she has long standing issues with vertigo but that this episode has been getting worse since recent back surgery. She states she does not know what causes dizziness but she does feel better in recliner with head supported. Dizziness increases with walking and when she tried her eye exercises from previous vestibular treatment. Pt presents to clinic using RW for ambulation and LSO due to recent surgery Pt accompanied by: self  PERTINENT HISTORY: Lumbar fusion 03/2024, cervical fusion  PAIN:  Are you having pain? No  PRECAUTIONS: Fall  RED FLAGS: None   WEIGHT BEARING RESTRICTIONS: No  FALLS: Has patient fallen in last 6  months? Yes. Number of falls several prior to surgery  LIVING ENVIRONMENT: Lives with: lives with their spouse Has following equipment at home: Vannie - 2 wheeled  PLOF: Independent  PATIENT GOALS: reduce dizziness  OBJECTIVE:  Note: Objective measures were completed at Evaluation unless otherwise noted.       VESTIBULAR ASSESSMENT:     SYMPTOM BEHAVIOR:  Subjective history: see above  Non-Vestibular symptoms: nausea/vomiting  Type of dizziness: Funny feeling in the head and Swimmyheaded  Frequency: weekly  Duration: all day  Aggravating factors: Induced by motion: occur when walking and activity in general  Relieving factors: head stationary and medication  Progression of symptoms: worse  OCULOMOTOR EXAM:  Ocular Alignment: normal  Ocular ROM: No Limitations  Spontaneous Nystagmus: absent  Gaze-Induced Nystagmus: age appropriate nystagmus at end range  Smooth Pursuits: intact - some increased symptoms with Rt gaze 4/10  Saccades: dysmetria and slow  - increased symptoms 4/10     VESTIBULAR - OCULAR REFLEX:   Slow VOR: Comment: increase in symptoms 4/10  VOR Cancellation: Normal symptoms 2/10  Head-Impulse Test: HIT Right:  negative HIT Left: negative increase in symptoms 4/10    POSITIONAL TESTING: Right Dix-Hallpike: no nystagmus Left Dix-Hallpike: no nystagmus Right Roll Test: no nystagmus Left Roll Test: no nystagmus  MOTION SENSITIVITY:  Motion Sensitivity Quotient Intensity: 0 = none, 1 = Lightheaded, 2 = Mild, 3 = Moderate, 4 = Severe, 5 = Vomiting  Intensity  1. Sitting to supine   2. Supine to L side   3. Supine to R side   4. Supine to sitting   5. L Hallpike-Dix   6. Up from L    7. R Hallpike-Dix   8. Up from R    9. Sitting, head tipped to L knee   10. Head up from L knee   11. Sitting, head tipped to R knee   12. Head up from R knee   13. Sitting head turns x5   14.Sitting head nods x5   15. In stance, 180 turn to L    16. In stance, 180 turn to R                                                                                                                                  TREATMENT DATE: 06/09/24   Gaze Adaptation:  x1 Viewing Horizontal: Position: seated and Reps: 5 Saccades horizontal x 5 Saccades vertical x 5   PATIENT EDUCATION: Education details: PT POC and goals, HEP Person educated: Patient Education method: Explanation, Demonstration, and Handouts Education comprehension: verbalized understanding and returned demonstration  HOME EXERCISE PROGRAM: Access Code: [email or web address] URL: [email or web address] Date: 06/09/2024 Prepared by: Darice Conine  Exercises - Seated Horizontal Saccades  - 3 x daily - 7 x weekly - 1 sets - 10 reps - Seated Vertical Saccades  - 3 x daily - 7 x weekly - 1 sets - 10 reps -  Seated Gaze Stabilization with Head Rotation  - 3 x daily - 7 x weekly - 1 sets - 10 reps   GOALS: Goals reviewed with patient? Yes  SHORT TERM GOALS: Target date: 07/07/2024    Pt will be independent with initial HEP Baseline: Goal status: INITIAL    LONG TERM GOALS: Target date: 08/04/2024    Pt will be independent with advanced  HEP Baseline:  Goal status: INITIAL  2.  Pt will report instances of wooziness <= 1/week Baseline:  Goal status: INITIAL  3.  Pt will report symptoms lasting <= 2 hours Baseline:  Goal status: INITIAL  4.  Pt will tolerate walking x 10 minutes without increase in symptoms Baseline:  Goal status: INITIAL    ASSESSMENT:  CLINICAL IMPRESSION: Patient is a 73 y.o. female who was seen today for physical therapy evaluation and treatment for vertigo. She presents with wooziness mostly with walking and moving around. She presents with increased symptoms with saccades and VOR, negative positional testing. Pt will benefit from skilled PT to address deficits and improve functional activity tolerance..   OBJECTIVE IMPAIRMENTS: decreased activity tolerance, decreased balance, and dizziness.   ACTIVITY LIMITATIONS: locomotion level  PARTICIPATION LIMITATIONS: meal prep, cleaning, and community activity  PERSONAL FACTORS: Time since onset of injury/illness/exacerbation and 1-2 comorbidities: history of lumbar and cervical fusion are also affecting patient's functional outcome.   REHAB POTENTIAL: Good  CLINICAL DECISION MAKING: Evolving/moderate complexity  EVALUATION COMPLEXITY: Moderate   PLAN:  PT FREQUENCY: 1x/week  PT DURATION: 8 weeks  PLANNED INTERVENTIONS: 97164- PT Re-evaluation, 97110-Therapeutic exercises, 97530- Therapeutic activity, [id number]- Neuromuscular re-education, 97535- Self Care, [id number]- Manual therapy, [id number]- Gait training, [phone]- Canalith repositioning, Patient/Family education, Balance training, and Vestibular training  PLAN FOR NEXT SESSION: assess response to HEP, saccades, VOR progress as tolerated   Martino Tompson, PT 06/09/2024, 9:28 AM

## 2024-06-12 ENCOUNTER — Encounter: Payer: Self-pay | Admitting: Family Medicine

## 2024-06-12 NOTE — Telephone Encounter (Signed)
 Please call and get download from machine.

## 2024-06-13 ENCOUNTER — Encounter: Payer: Self-pay | Admitting: Physical Therapy

## 2024-06-13 ENCOUNTER — Ambulatory Visit: Admitting: Physical Therapy

## 2024-06-13 ENCOUNTER — Ambulatory Visit

## 2024-06-13 DIAGNOSIS — R42 Dizziness and giddiness: Secondary | ICD-10-CM

## 2024-06-13 NOTE — Therapy (Signed)
 OUTPATIENT PHYSICAL THERAPY VESTIBULAR TREATMENT     Patient Name: Mia [name] MRN: [id number] DOB:1951-06-04, 73 y.o., female Today's Date: 06/13/2024  END OF SESSION:  PT End of Session - 06/13/24 1528     Visit Number 2    Number of Visits 8    Date for PT Re-Evaluation 08/04/24    Authorization Type medicare    Authorization - Visit Number 1    Progress Note Due on Visit 10    PT Start Time 1445    PT Stop Time 1528    PT Time Calculation (min) 43 min    Activity Tolerance Patient tolerated treatment well    Behavior During Therapy [facility] for tasks assessed/performed           Past Medical History:  Diagnosis Date   Anal fissure    Anemia    Arthritis    Blood transfusion    C. difficile colitis    Chest pain    a. 01/2013 MV: EF 59%, no ischemia.   Chronic Dyspnea    a. 01/2013 Echo: EF 60-65%, Gr 1 DD, PASP 105mmHg.// Echo 01/2020: EF 60-65, no RWMA, GR 1 DD, GLS -21.6%, normal RV SF, trivial MR    COVID-19    DDD (degenerative disc disease), cervical    DDD (degenerative disc disease), lumbar    DVT (deep venous thrombosis) (HCC) 06/23/2023   left lower extremity   Fibromyalgia    Gall stones    GERD (gastroesophageal reflux disease)    gastritis   Hemorrhoids    Hypertension    Interstitial cystitis    MCI (mild cognitive impairment) 11/25/2017   Memory difficulty 12/14/2013   Neuromuscular disorder (HCC)    sclerosis   Osteoporosis    Peptic ulcer    PONV (postoperative nausea and vomiting)    Pseudogout    Raynaud phenomenon    Rectal bleeding    Sleep apnea    a. on cpap.   Stress fracture of left tibia 11/05/2016   SVT (supraventricular tachycardia) (HCC)    Syncope 09/10/2015   Thyroid  disease    hypothyroidism   Tremor, essential 05/14/2016   Past Surgical History:  Procedure Laterality Date   APPENDECTOMY     BLADDER SURGERY     x2   BLADDER SURGERY     BREAST BIOPSY     CARDIAC CATHETERIZATION     CARPAL TUNNEL RELEASE      CATARACT EXTRACTION Bilateral    CHOLECYSTECTOMY     DILATION AND CURETTAGE OF UTERUS     ENTEROCELE REPAIR     x2   EYE SURGERY     retina   herniated disc  11/25/2021   L4 and L5   hysterectomy - unknown type     JOINT REPLACEMENT     KNEE ARTHROPLASTY     KNEE SURGERY     x6   NECK SURGERY     fusion   OOPHORECTOMY     QUADRICEPS REPAIR Right    RECTOCELE REPAIR     x2   SHOULDER ARTHROSCOPY WITH SUBACROMIAL DECOMPRESSION, ROTATOR CUFF REPAIR AND BICEP TENDON REPAIR  10/06/2012   Procedure: SHOULDER ARTHROSCOPY WITH SUBACROMIAL DECOMPRESSION, ROTATOR CUFF REPAIR AND BICEP TENDON REPAIR;  Surgeon: Eva Elsie Herring, MD;  Location: [facility];  Service: Orthopedics;  Laterality: Right;  Arthroscopic  Repair  of  Subscapularis, Open Biceps Tenodesis   SHOULDER SURGERY     bilateral- bones spur   SHOULDER SURGERY  Left 04/15/2021   TONSILLECTOMY     TOTAL KNEE ARTHROPLASTY     TOTAL SHOULDER ARTHROPLASTY     TRANSFORAMINAL LUMBAR INTERBODY FUSION (TLIF) WITH PEDICLE SCREW FIXATION 1 LEVEL Left 04/20/2024   Procedure: TRANSFORAMINAL LUMBAR INTERBODY FUSION (TLIF) WITH PEDICLE SCREW FIXATION 1 LEVEL;  Surgeon: Beuford Anes, MD;  Location: [facility];  Service: Orthopedics;  Laterality: Left;  LEFT-SIDED LUMBAR 4 - LUMBAR 5 TRANSFORAMINAL LUMBAR INTERBODY FUSION AND DECOMPRESSION WITH INSTRUMENTATION AND ALLOGRAFT   Patient Active Problem List   Diagnosis Date Noted   Radiculopathy 04/20/2024   History of DVT of lower extremity 08/23/2023   Pain of right sacroiliac joint 10/15/2022   Palpitations 06/02/2022   Pernicious anemia 09/11/2020   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   Weakness of both lower extremities 06/11/2020   Hyponatremia 05/22/2020   Gait instability 06/20/2019   Facet arthritis of lumbar region 05/10/2018   MCI (mild cognitive impairment) 11/25/2017   Restrictive lung disease 07/30/2017   Mixed hyperlipidemia 06/18/2017   Rectocele 04/26/2017    Irritable bowel syndrome with both constipation and diarrhea 04/26/2017   Osteoporosis 04/18/2017   Trochanteric bursitis of both hips 02/24/2017   Inflammatory arthritis 09/30/2016   High risk medication use 09/30/2016   History of Clostridium difficile colitis 09/30/2016   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   Pseudogout 09/30/2016   DDD cervical spine status post fusion 09/30/2016   DDD thoracic spine 09/30/2016   H/O total knee replacement, right 09/30/2016   Age-related osteoporosis without current pathological fracture 09/30/2016   Tremor, essential 05/14/2016   Right foot pain 04/14/2016   B12 deficiency 09/10/2015   Cervical facet joint syndrome 09/10/2015   Chronic fatigue 09/10/2015   Aortic atherosclerosis (HCC) 04/01/2015   DOE (dyspnea on exertion)    Primary osteoarthritis of right knee 08/13/2014   Benign head tremor 06/11/2014   Lumbar degenerative disc disease 06/11/2014   IFG (impaired fasting glucose) 03/09/2014   Hemorrhoid 03/09/2014   Retinal wrinkling, right eye 03/09/2014   Fibromyalgia 03/09/2014   GERD (gastroesophageal reflux disease) 03/09/2014   History of arthroplasty of right knee 01/31/2014   Hemorrhoids, external, thrombosed 06/22/2011   Hypertension 03/11/2011   SVT (supraventricular tachycardia) (HCC)    Raynaud phenomenon    Nonspecific (abnormal) findings on radiological and other examination of body structure 08/25/2010   COMPUTERIZED TOMOGRAPHY, CHEST, ABNORMAL 08/25/2010   OSA (obstructive sleep apnea) 04/24/2009   Allergic rhinitis 06/11/2008   Dyspnea 06/11/2008   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   Chronic diastolic CHF (congestive heart failure) (HCC) 06/08/2008   INTERSTITIAL CYSTITIS 06/08/2008    PCP: Alvan REFERRING PROVIDER: Metheney  REFERRING DIAG: vertigo  THERAPY DIAG:  Dizziness and giddiness  ONSET DATE: 04/20/24  Rationale for Evaluation and Treatment: Rehabilitation  SUBJECTIVE:    SUBJECTIVE STATEMENT: Pt states that she did her exercises at home. She had a few good days but felt very bad on Saturday and felt bad this morning. She states she felt worse after walking around, she was worried about falling.  PERTINENT HISTORY: Lumbar fusion 03/2024, cervical fusion Pt has c/o wooziness that comes and goes. She states some days it is ok and some days it is bad. She states she has long standing issues with vertigo but that this episode has been getting worse since recent back surgery. She states she does not know what causes dizziness but she does feel better in recliner with head supported. Dizziness increases with walking and when she tried her eye exercises from previous vestibular  treatment. Pt presents to clinic using RW for ambulation and LSO due to recent surgery Pt accompanied by: self PAIN:  Are you having pain? No  PRECAUTIONS: Fall  RED FLAGS: None   WEIGHT BEARING RESTRICTIONS: No  FALLS: Has patient fallen in last 6 months? Yes. Number of falls several prior to surgery  LIVING ENVIRONMENT: Lives with: lives with their spouse Has following equipment at home: Vannie - 2 wheeled  PLOF: Independent  PATIENT GOALS: reduce dizziness  OBJECTIVE:  Note: Objective measures were completed at Evaluation unless otherwise noted.       VESTIBULAR ASSESSMENT:     SYMPTOM BEHAVIOR:  Subjective history: see above  Non-Vestibular symptoms: nausea/vomiting  Type of dizziness: Funny feeling in the head and Swimmyheaded  Frequency: weekly  Duration: all day  Aggravating factors: Induced by motion: occur when walking and activity in general  Relieving factors: head stationary and medication  Progression of symptoms: worse  OCULOMOTOR EXAM:  Ocular Alignment: normal  Ocular ROM: No Limitations  Spontaneous Nystagmus: absent  Gaze-Induced Nystagmus: age appropriate nystagmus at end range  Smooth Pursuits: intact - some increased symptoms with  Rt gaze 4/10  Saccades: dysmetria and slow  - increased symptoms 4/10     VESTIBULAR - OCULAR REFLEX:   Slow VOR: Comment: increase in symptoms 4/10  VOR Cancellation: Normal symptoms 2/10  Head-Impulse Test: HIT Right: negative HIT Left: negative increase in symptoms 4/10    POSITIONAL TESTING: Right Dix-Hallpike: no nystagmus Left Dix-Hallpike: no nystagmus Right Roll Test: no nystagmus Left Roll Test: no nystagmus  MOTION SENSITIVITY:  Motion Sensitivity Quotient Intensity: 0 = none, 1 = Lightheaded, 2 = Mild, 3 = Moderate, 4 = Severe, 5 = Vomiting  Intensity  1. Sitting to supine   2. Supine to L side   3. Supine to R side   4. Supine to sitting   5. L Hallpike-Dix   6. Up from L    7. R Hallpike-Dix   8. Up from R    9. Sitting, head tipped to L knee   10. Head up from L knee   11. Sitting, head tipped to R knee   12. Head up from R knee   13. Sitting head turns x5   14.Sitting head nods x5   15. In stance, 180 turn to L    16. In stance, 180 turn to R         [facility] Adult PT Treatment:                                                DATE: 06/13/24  Neuromuscular re-ed: M-CTSIB = 50.6/120 Seated Saccades horizontal: 25 sec Saccades vertical: 25 sec VOR horizontal 3 x 30 sec Standing Saccades horizontal: 20 sec Saccades vertical: 8 sec  TREATMENT DATE: 06/09/24   Gaze Adaptation:  x1 Viewing Horizontal: Position: seated and Reps: 5 Saccades horizontal x 5 Saccades vertical x 5   PATIENT EDUCATION: Education details: PT POC and goals, HEP Person educated: Patient Education method: Explanation, Demonstration, and Handouts Education comprehension: verbalized understanding and returned demonstration  HOME EXERCISE PROGRAM: Access Code: [email or web address] URL: [email or web address] Date: 06/13/2024 Prepared by: Darice Conine  Exercises - Seated Horizontal Saccades  - 3 x daily - 7 x weekly - 1 sets - 10 reps - Seated Vertical Saccades  - 3 x daily - 7 x weekly - 1 sets - 10 reps - Seated Gaze Stabilization with Head Rotation  - 3 x daily - 7 x weekly - 1 sets - 10 reps - Standing Balance in Corner with Eyes Closed  - 3 x daily - 7 x weekly - 1 sets - 3 reps - 30 seconds hold   GOALS: Goals reviewed with patient? Yes  SHORT TERM GOALS: Target date: 07/07/2024    Pt will be independent with initial HEP Baseline: Goal status: INITIAL    LONG TERM GOALS: Target date: 08/04/2024    Pt will be independent with advanced HEP Baseline:  Goal status: INITIAL  2.  Pt will report instances of wooziness <= 1/week Baseline:  Goal status: INITIAL  3.  Pt will report symptoms lasting <= 2 hours Baseline:  Goal status: INITIAL  4.  Pt will tolerate walking x 10 minutes without increase in symptoms Baseline:  Goal status: INITIAL    ASSESSMENT:  CLINICAL IMPRESSION: Pt with significant deficits noted in vestibular and visual systems with the M-CTSIB, relying almost exclusively on somatosensory system for balance. Added eyes closed balance to HEP. She has improved tolerance to saccades and VOR but was not ready to progress to standing for these at this visit  OBJECTIVE IMPAIRMENTS: decreased activity tolerance, decreased balance, and dizziness.      PLAN:  PT FREQUENCY: 1x/week  PT DURATION: 8 weeks  PLANNED INTERVENTIONS: 97164- PT Re-evaluation, 97110-Therapeutic exercises, 97530- Therapeutic activity, [id number]- Neuromuscular re-education, 97535- Self Care, 02859- Manual therapy, [phone]- Gait training, [phone]- Canalith repositioning, Patient/Family education, Balance training, and Vestibular training  PLAN FOR NEXT SESSION: EC, saccades, VOR   Lavonda Thal, PT 06/13/2024, 3:28 PM

## 2024-06-14 ENCOUNTER — Ambulatory Visit (INDEPENDENT_AMBULATORY_CARE_PROVIDER_SITE_OTHER)

## 2024-06-14 VITALS — BP 145/54 | HR 68

## 2024-06-14 DIAGNOSIS — E538 Deficiency of other specified B group vitamins: Secondary | ICD-10-CM | POA: Diagnosis not present

## 2024-06-14 MED ORDER — CYANOCOBALAMIN 1000 MCG/ML IJ SOLN
1000.0000 ug | Freq: Once | INTRAMUSCULAR | Status: AC
Start: 2024-06-14 — End: 2024-06-14
  Administered 2024-06-14: 1000 ug via INTRAMUSCULAR

## 2024-06-14 NOTE — Progress Notes (Signed)
   Established Patient Office Visit  Subjective   Patient ID: Mia [name], female    DOB: Apr 07, 1951  Age: 73 y.o. MRN: [id number]  No chief complaint on file.   HPI  Mia [name] is here for a vitamin B 12 injection.    ROS    Objective:     BP (!) 145/54   Pulse 68   SpO2 99%    Physical Exam   No results found for any visits on 06/14/24.    The 10-year ASCVD risk score (Arnett DK, et al., 2019) is: 32.1%    Assessment & Plan:  B12 injection - Patient tolerated injection well without complications. Patient advised to schedule next injection 30 days from today.    Problem List Items Addressed This Visit       Unprioritized   B12 deficiency - Primary    Return in about 30 days (around 07/14/2024).    Mia [name], CMA

## 2024-06-15 ENCOUNTER — Telehealth: Payer: Self-pay

## 2024-06-15 DIAGNOSIS — H53453 Other localized visual field defect, bilateral: Secondary | ICD-10-CM | POA: Diagnosis not present

## 2024-06-15 NOTE — Telephone Encounter (Signed)
 Patients spouse came into office to drop off DMV forms to be completed by PCP, forms placed in Dr. Vernida box, thanks.

## 2024-06-19 NOTE — Telephone Encounter (Signed)
 Compliance report came thru today. Printed and placed in Dr. Vernida box.

## 2024-06-20 MED ORDER — AMBULATORY NON FORMULARY MEDICATION: 0 refills | Status: DC

## 2024-06-20 NOTE — Assessment & Plan Note (Signed)
 I was able to get a compliance report from July 9 to August 24 for her CPAP.  She did wear her CPAP for greater than 4 hours 83% of the time for 39 days and less than 4 hours for 15% of the time.  Her her AHI is 23.5 which is elevated so we may need to make an adjustment to her pressure.  Currently set at 12 cm of water pressure.  Will send over new order to increase pressure to 14.

## 2024-06-20 NOTE — Addendum Note (Signed)
 Addended by: Shaunak Kreis D on: 06/20/2024 05:05 PM   Modules accepted: Orders

## 2024-06-21 ENCOUNTER — Encounter: Payer: Self-pay | Admitting: Physical Therapy

## 2024-06-21 ENCOUNTER — Ambulatory Visit: Admitting: Physical Therapy

## 2024-06-21 DIAGNOSIS — R42 Dizziness and giddiness: Secondary | ICD-10-CM | POA: Diagnosis not present

## 2024-06-21 NOTE — Therapy (Signed)
 OUTPATIENT PHYSICAL THERAPY VESTIBULAR TREATMENT     Patient Name: Mia [name] MRN: [id number] DOB:[birth year], 73 y.o., female Today's Date: 06/21/2024  END OF SESSION:  PT End of Session - 06/21/24 1448     Visit Number 3    Number of Visits 8    Date for PT Re-Evaluation 08/04/24    Authorization Type medicare    Authorization - Visit Number 2    Progress Note Due on Visit 10    PT Start Time 1406    PT Stop Time 1444    PT Time Calculation (min) 38 min    Activity Tolerance Patient tolerated treatment well    Behavior During Therapy [facility] for tasks assessed/performed            Past Medical History:  Diagnosis Date   Anal fissure    Anemia    Arthritis    Blood transfusion    C. difficile colitis    Chest pain    a. 01/2013 MV: EF 59%, no ischemia.   Chronic Dyspnea    a. 01/2013 Echo: EF 60-65%, Gr 1 DD, PASP 26mmHg.// Echo 01/2020: EF 60-65, no RWMA, GR 1 DD, GLS -21.6%, normal RV SF, trivial MR    COVID-19    DDD (degenerative disc disease), cervical    DDD (degenerative disc disease), lumbar    DVT (deep venous thrombosis) (HCC) 06/23/2023   left lower extremity   Fibromyalgia    Gall stones    GERD (gastroesophageal reflux disease)    gastritis   Hemorrhoids    Hypertension    Interstitial cystitis    MCI (mild cognitive impairment) 11/25/2017   Memory difficulty 12/14/2013   Neuromuscular disorder (HCC)    sclerosis   Osteoporosis    Peptic ulcer    PONV (postoperative nausea and vomiting)    Pseudogout    Raynaud phenomenon    Rectal bleeding    Sleep apnea    a. on cpap.   Stress fracture of left tibia 11/05/2016   SVT (supraventricular tachycardia) (HCC)    Syncope 09/10/2015   Thyroid  disease    hypothyroidism   Tremor, essential 05/14/2016   Past Surgical History:  Procedure Laterality Date   APPENDECTOMY     BLADDER SURGERY     x2   BLADDER SURGERY     BREAST BIOPSY     CARDIAC CATHETERIZATION     CARPAL TUNNEL RELEASE      CATARACT EXTRACTION Bilateral    CHOLECYSTECTOMY     DILATION AND CURETTAGE OF UTERUS     ENTEROCELE REPAIR     x2   EYE SURGERY     retina   herniated disc  11/25/2021   L4 and L5   hysterectomy - unknown type     JOINT REPLACEMENT     KNEE ARTHROPLASTY     KNEE SURGERY     x6   NECK SURGERY     fusion   OOPHORECTOMY     QUADRICEPS REPAIR Right    RECTOCELE REPAIR     x2   SHOULDER ARTHROSCOPY WITH SUBACROMIAL DECOMPRESSION, ROTATOR CUFF REPAIR AND BICEP TENDON REPAIR  10/06/2012   Procedure: SHOULDER ARTHROSCOPY WITH SUBACROMIAL DECOMPRESSION, ROTATOR CUFF REPAIR AND BICEP TENDON REPAIR;  Surgeon: Eva Elsie Herring, MD;  Location: [facility];  Service: Orthopedics;  Laterality: Right;  Arthroscopic  Repair  of  Subscapularis, Open Biceps Tenodesis   SHOULDER SURGERY     bilateral- bones spur   SHOULDER  SURGERY Left 04/15/2021   TONSILLECTOMY     TOTAL KNEE ARTHROPLASTY     TOTAL SHOULDER ARTHROPLASTY     TRANSFORAMINAL LUMBAR INTERBODY FUSION (TLIF) WITH PEDICLE SCREW FIXATION 1 LEVEL Left 04/20/2024   Procedure: TRANSFORAMINAL LUMBAR INTERBODY FUSION (TLIF) WITH PEDICLE SCREW FIXATION 1 LEVEL;  Surgeon: Beuford Anes, MD;  Location: [facility];  Service: Orthopedics;  Laterality: Left;  LEFT-SIDED LUMBAR 4 - LUMBAR 5 TRANSFORAMINAL LUMBAR INTERBODY FUSION AND DECOMPRESSION WITH INSTRUMENTATION AND ALLOGRAFT   Patient Active Problem List   Diagnosis Date Noted   Radiculopathy 04/20/2024   History of DVT of lower extremity 08/23/2023   Pain of right sacroiliac joint 10/15/2022   Palpitations 06/02/2022   Pernicious anemia 09/11/2020   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   Weakness of both lower extremities 06/11/2020   Hyponatremia 05/22/2020   Gait instability 06/20/2019   Facet arthritis of lumbar region 05/10/2018   MCI (mild cognitive impairment) 11/25/2017   Restrictive lung disease 07/30/2017   Mixed hyperlipidemia 06/18/2017   Rectocele 04/26/2017    Irritable bowel syndrome with both constipation and diarrhea 04/26/2017   Osteoporosis 04/18/2017   Trochanteric bursitis of both hips 02/24/2017   Inflammatory arthritis 09/30/2016   High risk medication use 09/30/2016   History of Clostridium difficile colitis 09/30/2016   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   Pseudogout 09/30/2016   DDD cervical spine status post fusion 09/30/2016   DDD thoracic spine 09/30/2016   H/O total knee replacement, right 09/30/2016   Age-related osteoporosis without current pathological fracture 09/30/2016   Tremor, essential 05/14/2016   Right foot pain 04/14/2016   B12 deficiency 09/10/2015   Cervical facet joint syndrome 09/10/2015   Chronic fatigue 09/10/2015   Aortic atherosclerosis (HCC) 04/01/2015   DOE (dyspnea on exertion)    Primary osteoarthritis of right knee 08/13/2014   Benign head tremor 06/11/2014   Lumbar degenerative disc disease 06/11/2014   IFG (impaired fasting glucose) 03/09/2014   Hemorrhoid 03/09/2014   Retinal wrinkling, right eye 03/09/2014   Fibromyalgia 03/09/2014   GERD (gastroesophageal reflux disease) 03/09/2014   History of arthroplasty of right knee 01/31/2014   Hemorrhoids, external, thrombosed 06/22/2011   Hypertension 03/11/2011   SVT (supraventricular tachycardia) (HCC)    Raynaud phenomenon    Nonspecific (abnormal) findings on radiological and other examination of body structure 08/25/2010   COMPUTERIZED TOMOGRAPHY, CHEST, ABNORMAL 08/25/2010   OSA (obstructive sleep apnea) 04/24/2009   Allergic rhinitis 06/11/2008   Dyspnea 06/11/2008   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   Chronic diastolic CHF (congestive heart failure) (HCC) 06/08/2008   INTERSTITIAL CYSTITIS 06/08/2008    PCP: Alvan REFERRING PROVIDER: Metheney  REFERRING DIAG: vertigo  THERAPY DIAG:  Dizziness and giddiness  ONSET DATE: 04/20/24  Rationale for Evaluation and Treatment: Rehabilitation  SUBJECTIVE:    SUBJECTIVE STATEMENT: Pt reports her dizziness is worse in the mornings until mid afternoon. She did adjust her CPAP machine and adjusted her medication to try to help with dizziness  PERTINENT HISTORY: Lumbar fusion 03/2024, cervical fusion Pt has c/o wooziness that comes and goes. She states some days it is ok and some days it is bad. She states she has long standing issues with vertigo but that this episode has been getting worse since recent back surgery. She states she does not know what causes dizziness but she does feel better in recliner with head supported. Dizziness increases with walking and when she tried her eye exercises from previous vestibular treatment. Pt presents to clinic using RW for ambulation and  LSO due to recent surgery Pt accompanied by: self PAIN:  Are you having pain? No  PRECAUTIONS: Fall  RED FLAGS: None   WEIGHT BEARING RESTRICTIONS: No  FALLS: Has patient fallen in last 6 months? Yes. Number of falls several prior to surgery  LIVING ENVIRONMENT: Lives with: lives with their spouse Has following equipment at home: Vannie - 2 wheeled  PLOF: Independent  PATIENT GOALS: reduce dizziness  OBJECTIVE:  Note: Objective measures were completed at Evaluation unless otherwise noted.       VESTIBULAR ASSESSMENT:     SYMPTOM BEHAVIOR:  Subjective history: see above  Non-Vestibular symptoms: nausea/vomiting  Type of dizziness: Funny feeling in the head and Swimmyheaded  Frequency: weekly  Duration: all day  Aggravating factors: Induced by motion: occur when walking and activity in general  Relieving factors: head stationary and medication  Progression of symptoms: worse  OCULOMOTOR EXAM:  Ocular Alignment: normal  Ocular ROM: No Limitations  Spontaneous Nystagmus: absent  Gaze-Induced Nystagmus: age appropriate nystagmus at end range  Smooth Pursuits: intact - some increased symptoms with Rt gaze 4/10  Saccades: dysmetria and slow   - increased symptoms 4/10     VESTIBULAR - OCULAR REFLEX:   Slow VOR: Comment: increase in symptoms 4/10  VOR Cancellation: Normal symptoms 2/10  Head-Impulse Test: HIT Right: negative HIT Left: negative increase in symptoms 4/10    POSITIONAL TESTING: Right Dix-Hallpike: no nystagmus Left Dix-Hallpike: no nystagmus Right Roll Test: no nystagmus Left Roll Test: no nystagmus  MOTION SENSITIVITY:  Motion Sensitivity Quotient Intensity: 0 = none, 1 = Lightheaded, 2 = Mild, 3 = Moderate, 4 = Severe, 5 = Vomiting  Intensity  1. Sitting to supine   2. Supine to L side   3. Supine to R side   4. Supine to sitting   5. L Hallpike-Dix   6. Up from L    7. R Hallpike-Dix   8. Up from R    9. Sitting, head tipped to L knee   10. Head up from L knee   11. Sitting, head tipped to R knee   12. Head up from R knee   13. Sitting head turns x5   14.Sitting head nods x5   15. In stance, 180 turn to L    16. In stance, 180 turn to R        [facility] Adult PT Treatment:                                                DATE: 06/21/24  Neuromuscular re-ed: Standing Standing with EC with min A --> standing with EC with RW with supervision Saccades vertical with RW Saccades horizontal with RW Wall bumps with focus on finding center Head turns with spotting - no increase in symptoms Seated Gaze stabilization with 2 objects with head rotation - difficult!    [facility] Adult PT Treatment:                                                DATE: 06/13/24  Neuromuscular re-ed: M-CTSIB = 50.6/120 Seated Saccades horizontal: 25 sec Saccades vertical: 25 sec VOR horizontal 3 x 30 sec Standing Saccades horizontal: 20 sec Saccades vertical: 8 sec  TREATMENT DATE: 06/09/24   Gaze Adaptation:  x1 Viewing Horizontal: Position: seated and Reps: 5 Saccades horizontal x 5 Saccades  vertical x 5   PATIENT EDUCATION: Education details: PT POC and goals, HEP Person educated: Patient Education method: Explanation, Demonstration, and Handouts Education comprehension: verbalized understanding and returned demonstration  HOME EXERCISE PROGRAM: Access Code: [email or web address] URL: [email or web address] Date: 06/21/2024 Prepared by: Darice Conine  Exercises - Seated Horizontal Saccades  - 3 x daily - 7 x weekly - 1 sets - 10 reps - Seated Vertical Saccades  - 3 x daily - 7 x weekly - 1 sets - 10 reps - Seated Gaze Stabilization with Head Rotation  - 3 x daily - 7 x weekly - 1 sets - 10 reps - Standing Balance in Corner with Eyes Closed  - 3 x daily - 7 x weekly - 1 sets - 3 reps - 30 seconds hold - Standing Gaze Stabilization with Two Near Targets and Head Rotation  - 3 x daily - 7 x weekly - 3 sets - 10 reps   GOALS: Goals reviewed with patient? Yes  SHORT TERM GOALS: Target date: 07/07/2024    Pt will be independent with initial HEP Baseline: Goal status: INITIAL    LONG TERM GOALS: Target date: 08/04/2024    Pt will be independent with advanced HEP Baseline:  Goal status: INITIAL  2.  Pt will report instances of wooziness <= 1/week Baseline:  Goal status: INITIAL  3.  Pt will report symptoms lasting <= 2 hours Baseline:  Goal status: INITIAL  4.  Pt will tolerate walking x 10 minutes without increase in symptoms Baseline:  Goal status: INITIAL    ASSESSMENT:  CLINICAL IMPRESSION: Pt continues with noted vestibular hypofunction, relying heavily on somatosensory system. Training of somatosensory system increased with wall bumps and standing with EC. PT advised pt to stand with EC with RW for safety at home. Progressed saccades to standing with improving tolerance - still requires min A or RW for safety  OBJECTIVE IMPAIRMENTS: decreased activity tolerance, decreased balance, and dizziness.      PLAN:  PT FREQUENCY:  1x/week  PT DURATION: 8 weeks  PLANNED INTERVENTIONS: 97164- PT Re-evaluation, 97110-Therapeutic exercises, 97530- Therapeutic activity, [id number]- Neuromuscular re-education, 97535- Self Care, 02859- Manual therapy, [phone]- Gait training, [phone]- Canalith repositioning, Patient/Family education, Balance training, and Vestibular training  PLAN FOR NEXT SESSION: EC, saccades, somatosensory training   Jonte Wollam, PT 06/21/2024, 2:48 PM

## 2024-06-22 ENCOUNTER — Encounter: Payer: Self-pay | Admitting: Family Medicine

## 2024-06-22 NOTE — Telephone Encounter (Signed)
 Need to called [facility] and  Adjust CPAP  increase your pressure from 12 to 14 and see if that helps.

## 2024-06-23 MED ORDER — AMBULATORY NON FORMULARY MEDICATION: 0 refills | Status: DC

## 2024-06-23 NOTE — Telephone Encounter (Signed)
 Form completed and placed in [employer] form attached

## 2024-06-27 ENCOUNTER — Encounter: Payer: Self-pay | Admitting: Sports Medicine

## 2024-06-28 ENCOUNTER — Encounter: Payer: Self-pay | Admitting: Physical Therapy

## 2024-06-28 ENCOUNTER — Ambulatory Visit: Attending: Family Medicine | Admitting: Physical Therapy

## 2024-06-28 DIAGNOSIS — R42 Dizziness and giddiness: Secondary | ICD-10-CM | POA: Diagnosis present

## 2024-06-28 NOTE — Telephone Encounter (Signed)
 See phone note from 8/21. Given to [place].

## 2024-06-28 NOTE — Therapy (Signed)
 OUTPATIENT PHYSICAL THERAPY VESTIBULAR TREATMENT     Patient Name: Mia [name] MRN: [id number] DOB:[birth year], 73 y.o., female Today's Date: 06/28/2024  END OF SESSION:  PT End of Session - 06/28/24 1056     Visit Number 4    Number of Visits 8    Date for PT Re-Evaluation 08/04/24    Authorization Type medicare    Authorization - Visit Number 3    Progress Note Due on Visit 10    PT Start Time 1015    PT Stop Time 1054    PT Time Calculation (min) 39 min    Activity Tolerance Patient tolerated treatment well    Behavior During Therapy WFL for tasks assessed/performed             Past Medical History:  Diagnosis Date   Anal fissure    Anemia    Arthritis    Blood transfusion    C. difficile colitis    Chest pain    a. 01/2013 MV: EF 59%, no ischemia.   Chronic Dyspnea    a. 01/2013 Echo: EF 60-65%, Gr 1 DD, PASP 55mmHg.// Echo 01/2020: EF 60-65, no RWMA, GR 1 DD, GLS -21.6%, normal RV SF, trivial MR    COVID-19    DDD (degenerative disc disease), cervical    DDD (degenerative disc disease), lumbar    DVT (deep venous thrombosis) (HCC) 06/23/2023   left lower extremity   Fibromyalgia    Gall stones    GERD (gastroesophageal reflux disease)    gastritis   Hemorrhoids    Hypertension    Interstitial cystitis    MCI (mild cognitive impairment) 11/25/2017   Memory difficulty 12/14/2013   Neuromuscular disorder (HCC)    sclerosis   Osteoporosis    Peptic ulcer    PONV (postoperative nausea and vomiting)    Pseudogout    Raynaud phenomenon    Rectal bleeding    Sleep apnea    a. on cpap.   Stress fracture of left tibia 11/05/2016   SVT (supraventricular tachycardia) (HCC)    Syncope 09/10/2015   Thyroid  disease    hypothyroidism   Tremor, essential 05/14/2016   Past Surgical History:  Procedure Laterality Date   APPENDECTOMY     BLADDER SURGERY     x2   BLADDER SURGERY     BREAST BIOPSY     CARDIAC CATHETERIZATION     CARPAL TUNNEL RELEASE      CATARACT EXTRACTION Bilateral    CHOLECYSTECTOMY     DILATION AND CURETTAGE OF UTERUS     ENTEROCELE REPAIR     x2   EYE SURGERY     retina   herniated disc  11/25/2021   L4 and L5   hysterectomy - unknown type     JOINT REPLACEMENT     KNEE ARTHROPLASTY     KNEE SURGERY     x6   NECK SURGERY     fusion   OOPHORECTOMY     QUADRICEPS REPAIR Right    RECTOCELE REPAIR     x2   SHOULDER ARTHROSCOPY WITH SUBACROMIAL DECOMPRESSION, ROTATOR CUFF REPAIR AND BICEP TENDON REPAIR  10/06/2012   Procedure: SHOULDER ARTHROSCOPY WITH SUBACROMIAL DECOMPRESSION, ROTATOR CUFF REPAIR AND BICEP TENDON REPAIR;  Surgeon: Eva Elsie Herring, MD;  Location: [facility];  Service: Orthopedics;  Laterality: Right;  Arthroscopic  Repair  of  Subscapularis, Open Biceps Tenodesis   SHOULDER SURGERY     bilateral- bones spur  SHOULDER SURGERY Left 04/15/2021   TONSILLECTOMY     TOTAL KNEE ARTHROPLASTY     TOTAL SHOULDER ARTHROPLASTY     TRANSFORAMINAL LUMBAR INTERBODY FUSION (TLIF) WITH PEDICLE SCREW FIXATION 1 LEVEL Left 04/20/2024   Procedure: TRANSFORAMINAL LUMBAR INTERBODY FUSION (TLIF) WITH PEDICLE SCREW FIXATION 1 LEVEL;  Surgeon: Beuford Anes, MD;  Location: [facility];  Service: Orthopedics;  Laterality: Left;  LEFT-SIDED LUMBAR 4 - LUMBAR 5 TRANSFORAMINAL LUMBAR INTERBODY FUSION AND DECOMPRESSION WITH INSTRUMENTATION AND ALLOGRAFT   Patient Active Problem List   Diagnosis Date Noted   Radiculopathy 04/20/2024   History of DVT of lower extremity 08/23/2023   Pain of right sacroiliac joint 10/15/2022   Palpitations 06/02/2022   Pernicious anemia 09/11/2020   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   Weakness of both lower extremities 06/11/2020   Hyponatremia 05/22/2020   Gait instability 06/20/2019   Facet arthritis of lumbar region 05/10/2018   MCI (mild cognitive impairment) 11/25/2017   Restrictive lung disease 07/30/2017   Mixed hyperlipidemia 06/18/2017   Rectocele 04/26/2017    Irritable bowel syndrome with both constipation and diarrhea 04/26/2017   Osteoporosis 04/18/2017   Trochanteric bursitis of both hips 02/24/2017   Inflammatory arthritis 09/30/2016   High risk medication use 09/30/2016   History of Clostridium difficile colitis 09/30/2016   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   Pseudogout 09/30/2016   DDD cervical spine status post fusion 09/30/2016   DDD thoracic spine 09/30/2016   H/O total knee replacement, right 09/30/2016   Age-related osteoporosis without current pathological fracture 09/30/2016   Tremor, essential 05/14/2016   Right foot pain 04/14/2016   B12 deficiency 09/10/2015   Cervical facet joint syndrome 09/10/2015   Chronic fatigue 09/10/2015   Aortic atherosclerosis (HCC) 04/01/2015   DOE (dyspnea on exertion)    Primary osteoarthritis of right knee 08/13/2014   Benign head tremor 06/11/2014   Lumbar degenerative disc disease 06/11/2014   IFG (impaired fasting glucose) 03/09/2014   Hemorrhoid 03/09/2014   Retinal wrinkling, right eye 03/09/2014   Fibromyalgia 03/09/2014   GERD (gastroesophageal reflux disease) 03/09/2014   History of arthroplasty of right knee 01/31/2014   Hemorrhoids, external, thrombosed 06/22/2011   Hypertension 03/11/2011   SVT (supraventricular tachycardia) (HCC)    Raynaud phenomenon    Nonspecific (abnormal) findings on radiological and other examination of body structure 08/25/2010   COMPUTERIZED TOMOGRAPHY, CHEST, ABNORMAL 08/25/2010   OSA (obstructive sleep apnea) 04/24/2009   Allergic rhinitis 06/11/2008   Dyspnea 06/11/2008   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   Chronic diastolic CHF (congestive heart failure) (HCC) 06/08/2008   INTERSTITIAL CYSTITIS 06/08/2008    PCP: Alvan REFERRING PROVIDER: Metheney  REFERRING DIAG: vertigo  THERAPY DIAG:  Dizziness and giddiness  ONSET DATE: 04/20/24  Rationale for Evaluation and Treatment: Rehabilitation  SUBJECTIVE:    SUBJECTIVE STATEMENT: Pt reports she has had MD adjust her medication to try to reduce dizziness. So far she feels about the same. She is still having issues with sleep despite CPAP adjustments  PERTINENT HISTORY: Lumbar fusion 03/2024, cervical fusion Pt has c/o wooziness that comes and goes. She states some days it is ok and some days it is bad. She states she has long standing issues with vertigo but that this episode has been getting worse since recent back surgery. She states she does not know what causes dizziness but she does feel better in recliner with head supported. Dizziness increases with walking and when she tried her eye exercises from previous vestibular treatment. Pt presents to clinic using  RW for ambulation and LSO due to recent surgery Pt accompanied by: self PAIN:  Are you having pain? No  PRECAUTIONS: Fall  RED FLAGS: None   WEIGHT BEARING RESTRICTIONS: No  FALLS: Has patient fallen in last 6 months? Yes. Number of falls several prior to surgery  LIVING ENVIRONMENT: Lives with: lives with their spouse Has following equipment at home: Vannie - 2 wheeled  PLOF: Independent  PATIENT GOALS: reduce dizziness  OBJECTIVE:  Note: Objective measures were completed at Evaluation unless otherwise noted.       VESTIBULAR ASSESSMENT:     SYMPTOM BEHAVIOR:  Subjective history: see above  Non-Vestibular symptoms: nausea/vomiting  Type of dizziness: Funny feeling in the head and Swimmyheaded  Frequency: weekly  Duration: all day  Aggravating factors: Induced by motion: occur when walking and activity in general  Relieving factors: head stationary and medication  Progression of symptoms: worse  OCULOMOTOR EXAM:  Ocular Alignment: normal  Ocular ROM: No Limitations  Spontaneous Nystagmus: absent  Gaze-Induced Nystagmus: age appropriate nystagmus at end range  Smooth Pursuits: intact - some increased symptoms with Rt gaze 4/10  Saccades:  dysmetria and slow  - increased symptoms 4/10     VESTIBULAR - OCULAR REFLEX:   Slow VOR: Comment: increase in symptoms 4/10  VOR Cancellation: Normal symptoms 2/10  Head-Impulse Test: HIT Right: negative HIT Left: negative increase in symptoms 4/10    POSITIONAL TESTING: Right Dix-Hallpike: no nystagmus Left Dix-Hallpike: no nystagmus Right Roll Test: no nystagmus Left Roll Test: no nystagmus  MOTION SENSITIVITY:  Motion Sensitivity Quotient Intensity: 0 = none, 1 = Lightheaded, 2 = Mild, 3 = Moderate, 4 = Severe, 5 = Vomiting  Intensity  1. Sitting to supine   2. Supine to L side   3. Supine to R side   4. Supine to sitting   5. L Hallpike-Dix   6. Up from L    7. R Hallpike-Dix   8. Up from R    9. Sitting, head tipped to L knee   10. Head up from L knee   11. Sitting, head tipped to R knee   12. Head up from R knee   13. Sitting head turns x5   14.Sitting head nods x5   15. In stance, 180 turn to L    16. In stance, 180 turn to R        [facility] Adult PT Treatment:                                                DATE: 06/28/24  Neuromuscular re-ed: Seated spotting objects with head turns -- pt reports more difficulty with turning Lt Standing spotting objects with head turns horizontal and vertical, increasing amplitude as appropriate - SBA Standing quarter turns with spotting with CGA for balance Attempted 180 degree turns - pt requires mod A for LOB Seated gaze stabilization with 2 objects   [facility] Adult PT Treatment:                                                DATE: 06/21/24  Neuromuscular re-ed: Standing Standing with EC with min A --> standing with EC with RW with supervision Saccades vertical with RW  Saccades horizontal with RW Wall bumps with focus on finding center Head turns with spotting - no increase in symptoms Seated Gaze stabilization with 2 objects with head rotation - difficult!    [facility] Adult PT Treatment:                                                 DATE: 06/13/24  Neuromuscular re-ed: M-CTSIB = 50.6/120 Seated Saccades horizontal: 25 sec Saccades vertical: 25 sec VOR horizontal 3 x 30 sec Standing Saccades horizontal: 20 sec Saccades vertical: 8 sec   PATIENT EDUCATION: Education details: PT POC and goals, HEP Person educated: Patient Education method: Explanation, Demonstration, and Handouts Education comprehension: verbalized understanding and returned demonstration  HOME EXERCISE PROGRAM: Access Code: [email or web address] URL: [email or web address] Date: 06/28/2024 Prepared by: Darice Conine  Exercises - Standing Gaze Stabilization with Two Near Targets and Head Rotation  - 3 x daily - 7 x weekly - 3 sets - 10 reps - Standing Quarter Turn with Counter Support  - 1 x daily - 7 x weekly - 3 sets - 10 reps   GOALS: Goals reviewed with patient? Yes  SHORT TERM GOALS: Target date: 07/07/2024    Pt will be independent with initial HEP Baseline: Goal status: INITIAL    LONG TERM GOALS: Target date: 08/04/2024    Pt will be independent with advanced HEP Baseline:  Goal status: INITIAL  2.  Pt will report instances of wooziness <= 1/week Baseline:  Goal status: INITIAL  3.  Pt will report symptoms lasting <= 2 hours Baseline:  Goal status: INITIAL  4.  Pt will tolerate walking x 10 minutes without increase in symptoms Baseline:  Goal status: INITIAL    ASSESSMENT:  CLINICAL IMPRESSION: Pt with good improvements in balance with use of visual system for spotting. Able to perform quarter turns without increase in symptoms. Pt still has difficulty with gaze stabilization with 2 objects and head turns. Updated HEP as pt continues to progress  OBJECTIVE IMPAIRMENTS: decreased activity tolerance, decreased balance, and dizziness.      PLAN:  PT FREQUENCY: 1x/week  PT DURATION: 8 weeks  PLANNED INTERVENTIONS: 97164- PT Re-evaluation, 97110-Therapeutic exercises, 97530-  Therapeutic activity, [id number]- Neuromuscular re-education, 97535- Self Care, 02859- Manual therapy, [phone]- Gait training, [phone]- Canalith repositioning, Patient/Family education, Balance training, and Vestibular training  PLAN FOR NEXT SESSION: saccades, somatosensory training   Lanai Conlee, PT 06/28/2024, 10:56 AM

## 2024-06-28 NOTE — Telephone Encounter (Signed)
 Sent MyChart message to patient

## 2024-06-30 NOTE — Telephone Encounter (Signed)
 Forms placed up front for someone to call pt to advise her of completion. Also her forms were faxed and confirmation was received.

## 2024-07-03 NOTE — Telephone Encounter (Signed)
 Last OV: 02/15/2024  Next OV: 08/16/2024  Last refill: 06/07/2023

## 2024-07-05 ENCOUNTER — Ambulatory Visit (INDEPENDENT_AMBULATORY_CARE_PROVIDER_SITE_OTHER): Admitting: Podiatry

## 2024-07-05 ENCOUNTER — Ambulatory Visit: Admitting: Physical Therapy

## 2024-07-05 ENCOUNTER — Encounter: Payer: Self-pay | Admitting: Physical Therapy

## 2024-07-05 DIAGNOSIS — R42 Dizziness and giddiness: Secondary | ICD-10-CM

## 2024-07-05 DIAGNOSIS — M79675 Pain in left toe(s): Secondary | ICD-10-CM | POA: Diagnosis not present

## 2024-07-05 DIAGNOSIS — L853 Xerosis cutis: Secondary | ICD-10-CM

## 2024-07-05 DIAGNOSIS — B351 Tinea unguium: Secondary | ICD-10-CM

## 2024-07-05 DIAGNOSIS — M79674 Pain in right toe(s): Secondary | ICD-10-CM

## 2024-07-05 MED ORDER — AMMONIUM LACTATE 12 % EX LOTN: 1.0000 | TOPICAL_LOTION | CUTANEOUS | 0 refills | Status: AC | PRN

## 2024-07-05 MED ORDER — JANUVIA 100 MG PO TABS
100 | ORAL_TABLET | Freq: Every day | ORAL | 3 refills | Status: AC
Start: 2024-07-05 — End: ?

## 2024-07-05 NOTE — Progress Notes (Signed)
 Toenaislx 10   Xerosis amlactin   Subjective:  Patient ID: Mia [name], female    DOB: Mar 25, 1951,  MRN: [id number]  Chief Complaint  Patient presents with   Nail Problem    Pt stated that her toenails are thick and discolored she is unable to trim them    73 y.o. female returns for the above complaint.  Patient presents with thickened elongated dystrophic mycotic toenails x 10 mild pain on palpation hurts with ambulation and shoe pressure would like to have a debridement she has secondary complaint of dry skin she would like to discuss treatment for this.  She has tried over-the-counter options none of which has helped  Objective:  There were no vitals filed for this visit. Podiatric Exam: Vascular: dorsalis pedis and posterior tibial pulses are palpable bilateral. Capillary return is immediate. Temperature gradient is WNL. Skin turgor WNL  Sensorium: Normal Semmes Weinstein monofilament test. Normal tactile sensation bilaterally. Nail Exam: Pt has thick disfigured discolored nails with subungual debris noted bilateral entire nail hallux through fifth toenails.  Pain on palpation to the nails. Ulcer Exam: There is no evidence of ulcer or pre-ulcerative changes or infection. Orthopedic Exam: Muscle tone and strength are WNL. No limitations in general ROM. No crepitus or effusions noted.  Skin: No Porokeratosis. No infection or ulcers    Assessment & Plan:   1. Pain due to onychomycosis of toenails of both feet   2. Xerosis of skin     Patient was evaluated and treated and all questions answered.  Xerosis bilateral foot I explained to the patient the etiology of xerosis and various treatment options were extensively discussed.  I explained to the patient the importance of maintaining moisturization of the skin with application of over-the-counter lotion such as Eucerin or Luciderm.  No over-the-counter option has not helped patient would benefit from ammonium lactate  ammonium lactate   was sent to the pharmacy be applied twice a day she states understanding   Onychomycosis with pain  -Nails palliatively debrided as below. -Educated on self-care  Procedure: Nail Debridement Rationale: pain  Type of Debridement: manual, sharp debridement. Instrumentation: Nail nipper, rotary burr. Number of Nails: 10  Procedures and Treatment: Consent by patient was obtained for treatment procedures. The patient understood the discussion of treatment and procedures well. All questions were answered thoroughly reviewed. Debridement of mycotic and hypertrophic toenails, 1 through 5 bilateral and clearing of subungual debris. No ulceration, no infection noted.  Return Visit-Office Procedure: Patient instructed to return to the office for a follow up visit 3 months for continued evaluation and treatment.  Franky Blanch, DPM    Return in about 3 months (around 10/04/2024) for RFC .

## 2024-07-05 NOTE — Therapy (Signed)
 OUTPATIENT PHYSICAL THERAPY VESTIBULAR TREATMENT     Patient Name: Mia [name] MRN: [id number] DOB:11/27/1950, 73 y.o., female Today's Date: 07/05/2024  END OF SESSION:  PT End of Session - 07/05/24 1058     Visit Number 5    Number of Visits 8    Date for PT Re-Evaluation 08/04/24    Authorization Type medicare    Authorization - Visit Number 4    Progress Note Due on Visit 10    PT Start Time 1015    PT Stop Time 1055    PT Time Calculation (min) 40 min    Activity Tolerance Patient tolerated treatment well    Behavior During Therapy [facility] for tasks assessed/performed              Past Medical History:  Diagnosis Date   Anal fissure    Anemia    Arthritis    Blood transfusion    C. difficile colitis    Chest pain    a. 01/2013 MV: EF 59%, no ischemia.   Chronic Dyspnea    a. 01/2013 Echo: EF 60-65%, Gr 1 DD, PASP 65mmHg.// Echo 01/2020: EF 60-65, no RWMA, GR 1 DD, GLS -21.6%, normal RV SF, trivial MR    COVID-19    DDD (degenerative disc disease), cervical    DDD (degenerative disc disease), lumbar    DVT (deep venous thrombosis) (HCC) 06/23/2023   left lower extremity   Fibromyalgia    Gall stones    GERD (gastroesophageal reflux disease)    gastritis   Hemorrhoids    Hypertension    Interstitial cystitis    MCI (mild cognitive impairment) 11/25/2017   Memory difficulty 12/14/2013   Neuromuscular disorder (HCC)    sclerosis   Osteoporosis    Peptic ulcer    PONV (postoperative nausea and vomiting)    Pseudogout    Raynaud phenomenon    Rectal bleeding    Sleep apnea    a. on cpap.   Stress fracture of left tibia 11/05/2016   SVT (supraventricular tachycardia) (HCC)    Syncope 09/10/2015   Thyroid  disease    hypothyroidism   Tremor, essential 05/14/2016   Past Surgical History:  Procedure Laterality Date   APPENDECTOMY     BLADDER SURGERY     x2   BLADDER SURGERY     BREAST BIOPSY     CARDIAC CATHETERIZATION     CARPAL TUNNEL RELEASE      CATARACT EXTRACTION Bilateral    CHOLECYSTECTOMY     DILATION AND CURETTAGE OF UTERUS     ENTEROCELE REPAIR     x2   EYE SURGERY     retina   herniated disc  11/25/2021   L4 and L5   hysterectomy - unknown type     JOINT REPLACEMENT     KNEE ARTHROPLASTY     KNEE SURGERY     x6   NECK SURGERY     fusion   OOPHORECTOMY     QUADRICEPS REPAIR Right    RECTOCELE REPAIR     x2   SHOULDER ARTHROSCOPY WITH SUBACROMIAL DECOMPRESSION, ROTATOR CUFF REPAIR AND BICEP TENDON REPAIR  10/06/2012   Procedure: SHOULDER ARTHROSCOPY WITH SUBACROMIAL DECOMPRESSION, ROTATOR CUFF REPAIR AND BICEP TENDON REPAIR;  Surgeon: Eva Elsie Herring, MD;  Location: [facility];  Service: Orthopedics;  Laterality: Right;  Arthroscopic  Repair  of  Subscapularis, Open Biceps Tenodesis   SHOULDER SURGERY     bilateral- bones spur  SHOULDER SURGERY Left 04/15/2021   TONSILLECTOMY     TOTAL KNEE ARTHROPLASTY     TOTAL SHOULDER ARTHROPLASTY     TRANSFORAMINAL LUMBAR INTERBODY FUSION (TLIF) WITH PEDICLE SCREW FIXATION 1 LEVEL Left 04/20/2024   Procedure: TRANSFORAMINAL LUMBAR INTERBODY FUSION (TLIF) WITH PEDICLE SCREW FIXATION 1 LEVEL;  Surgeon: Beuford Anes, MD;  Location: [facility] OR;  Service: Orthopedics;  Laterality: Left;  LEFT-SIDED LUMBAR 4 - LUMBAR 5 TRANSFORAMINAL LUMBAR INTERBODY FUSION AND DECOMPRESSION WITH INSTRUMENTATION AND ALLOGRAFT   Patient Active Problem List   Diagnosis Date Noted   Radiculopathy 04/20/2024   History of DVT of lower extremity 08/23/2023   Pain of right sacroiliac joint 10/15/2022   Palpitations 06/02/2022   Pernicious anemia 09/11/2020   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   Weakness of both lower extremities 06/11/2020   Hyponatremia 05/22/2020   Gait instability 06/20/2019   Facet arthritis of lumbar region 05/10/2018   MCI (mild cognitive impairment) 11/25/2017   Restrictive lung disease 07/30/2017   Mixed hyperlipidemia 06/18/2017   Rectocele 04/26/2017    Irritable bowel syndrome with both constipation and diarrhea 04/26/2017   Osteoporosis 04/18/2017   Trochanteric bursitis of both hips 02/24/2017   Inflammatory arthritis 09/30/2016   High risk medication use 09/30/2016   History of Clostridium difficile colitis 09/30/2016   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   Pseudogout 09/30/2016   DDD cervical spine status post fusion 09/30/2016   DDD thoracic spine 09/30/2016   H/O total knee replacement, right 09/30/2016   Age-related osteoporosis without current pathological fracture 09/30/2016   Tremor, essential 05/14/2016   Right foot pain 04/14/2016   B12 deficiency 09/10/2015   Cervical facet joint syndrome 09/10/2015   Chronic fatigue 09/10/2015   Aortic atherosclerosis (HCC) 04/01/2015   DOE (dyspnea on exertion)    Primary osteoarthritis of right knee 08/13/2014   Benign head tremor 06/11/2014   Lumbar degenerative disc disease 06/11/2014   IFG (impaired fasting glucose) 03/09/2014   Hemorrhoid 03/09/2014   Retinal wrinkling, right eye 03/09/2014   Fibromyalgia 03/09/2014   GERD (gastroesophageal reflux disease) 03/09/2014   History of arthroplasty of right knee 01/31/2014   Hemorrhoids, external, thrombosed 06/22/2011   Hypertension 03/11/2011   SVT (supraventricular tachycardia) (HCC)    Raynaud phenomenon    Nonspecific (abnormal) findings on radiological and other examination of body structure 08/25/2010   COMPUTERIZED TOMOGRAPHY, CHEST, ABNORMAL 08/25/2010   OSA (obstructive sleep apnea) 04/24/2009   Allergic rhinitis 06/11/2008   Dyspnea 06/11/2008   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   Chronic diastolic CHF (congestive heart failure) (HCC) 06/08/2008   INTERSTITIAL CYSTITIS 06/08/2008    PCP: [facility] REFERRING PROVIDER: Metheney  REFERRING DIAG: vertigo  THERAPY DIAG:  Dizziness and giddiness  ONSET DATE: 04/20/24  Rationale for Evaluation and Treatment: Rehabilitation  SUBJECTIVE:    SUBJECTIVE STATEMENT: Pt states she has completely gotten off of gabapentin  and she thinks this has helped decrease her dizziness. She ambulates into clinic today without RW  PERTINENT HISTORY: Lumbar fusion 03/2024, cervical fusion Pt has c/o wooziness that comes and goes. She states some days it is ok and some days it is bad. She states she has long standing issues with vertigo but that this episode has been getting worse since recent back surgery. She states she does not know what causes dizziness but she does feel better in recliner with head supported. Dizziness increases with walking and when she tried her eye exercises from previous vestibular treatment. Pt presents to clinic using RW for ambulation and LSO due  to recent surgery Pt accompanied by: self PAIN:  Are you having pain? No  PRECAUTIONS: Fall  RED FLAGS: None   WEIGHT BEARING RESTRICTIONS: No  FALLS: Has patient fallen in last 6 months? Yes. Number of falls several prior to surgery  LIVING ENVIRONMENT: Lives with: lives with their spouse Has following equipment at home: Vannie - 2 wheeled  PLOF: Independent  PATIENT GOALS: reduce dizziness  OBJECTIVE:  Note: Objective measures were completed at Evaluation unless otherwise noted.       VESTIBULAR ASSESSMENT:     SYMPTOM BEHAVIOR:  Subjective history: see above  Non-Vestibular symptoms: nausea/vomiting  Type of dizziness: Funny feeling in the head and Swimmyheaded  Frequency: weekly  Duration: all day  Aggravating factors: Induced by motion: occur when walking and activity in general  Relieving factors: head stationary and medication  Progression of symptoms: worse  OCULOMOTOR EXAM:  Ocular Alignment: normal  Ocular ROM: No Limitations  Spontaneous Nystagmus: absent  Gaze-Induced Nystagmus: age appropriate nystagmus at end range  Smooth Pursuits: intact - some increased symptoms with Rt gaze 4/10  Saccades: dysmetria and slow  -  increased symptoms 4/10     VESTIBULAR - OCULAR REFLEX:   Slow VOR: Comment: increase in symptoms 4/10  VOR Cancellation: Normal symptoms 2/10  Head-Impulse Test: HIT Right: negative HIT Left: negative increase in symptoms 4/10    POSITIONAL TESTING: Right Dix-Hallpike: no nystagmus Left Dix-Hallpike: no nystagmus Right Roll Test: no nystagmus Left Roll Test: no nystagmus  MOTION SENSITIVITY:  Motion Sensitivity Quotient Intensity: 0 = none, 1 = Lightheaded, 2 = Mild, 3 = Moderate, 4 = Severe, 5 = Vomiting  Intensity  1. Sitting to supine   2. Supine to L side   3. Supine to R side   4. Supine to sitting   5. L Hallpike-Dix   6. Up from L    7. R Hallpike-Dix   8. Up from R    9. Sitting, head tipped to L knee   10. Head up from L knee   11. Sitting, head tipped to R knee   12. Head up from R knee   13. Sitting head turns x5   14.Sitting head nods x5   15. In stance, 180 turn to L    16. In stance, 180 turn to R        [facility] Adult PT Treatment:                                                DATE: 07/05/24  Neuromuscular re-ed: Seated spotting objects with head turns -- woozy after 8 reps Standing spotting objects with head turns horizontal - SBA Standing quarter turns with spotting SBA Standing 180 degree turns CGA today Gait with turns with spotting with CGA - less woozy  Seated VOR x 1 horizontal - up to 6 reps before symptoms 6/10 VOR x 1 vertical - no symptoms Seated gaze stabilization with 2 objects - able to complete 8 reps Standing EC 2 x 10 sec with SBA - no LOB   [facility] Adult PT Treatment:  DATE: 06/28/24  Neuromuscular re-ed: Seated spotting objects with head turns -- pt reports more difficulty with turning Lt Standing spotting objects with head turns horizontal and vertical, increasing amplitude as appropriate - SBA Standing quarter turns with spotting with CGA for balance Attempted 180 degree turns -  pt requires mod A for LOB Seated gaze stabilization with 2 objects   [facility] Adult PT Treatment:                                                DATE: 06/21/24  Neuromuscular re-ed: Standing Standing with EC with min A --> standing with EC with RW with supervision Saccades vertical with RW Saccades horizontal with RW Wall bumps with focus on finding center Head turns with spotting - no increase in symptoms Seated Gaze stabilization with 2 objects with head rotation - difficult!    PATIENT EDUCATION: Education details: PT POC and goals, HEP Person educated: Patient Education method: Explanation, Demonstration, and Handouts Education comprehension: verbalized understanding and returned demonstration  HOME EXERCISE PROGRAM: Access Code: [email or web address] URL: [email or web address] Date: 06/28/2024 Prepared by: Darice Conine  Exercises - Standing Gaze Stabilization with Two Near Targets and Head Rotation  - 3 x daily - 7 x weekly - 3 sets - 10 reps - Standing Quarter Turn with Counter Support  - 1 x daily - 7 x weekly - 3 sets - 10 reps   GOALS: Goals reviewed with patient? Yes  SHORT TERM GOALS: Target date: 07/07/2024    Pt will be independent with initial HEP Baseline: Goal status: MET    LONG TERM GOALS: Target date: 08/04/2024    Pt will be independent with advanced HEP Baseline:  Goal status: INITIAL  2.  Pt will report instances of wooziness <= 1/week Baseline:  Goal status: INITIAL  3.  Pt will report symptoms lasting <= 2 hours Baseline:  Goal status: INITIAL  4.  Pt will tolerate walking x 10 minutes without increase in symptoms Baseline:  Goal status: INITIAL    ASSESSMENT:  CLINICAL IMPRESSION: Pt is progressing with functional balance. She is improving ability to turn while using spotting to decrease wooziness. She continues with difficulty with horizontal VOR but is improving gaze stabilization with 2 objects. She is making  consistent progress towards LTGs  OBJECTIVE IMPAIRMENTS: decreased activity tolerance, decreased balance, and dizziness.      PLAN:  PT FREQUENCY: 1x/week  PT DURATION: 8 weeks  PLANNED INTERVENTIONS: 97164- PT Re-evaluation, 97110-Therapeutic exercises, 97530- Therapeutic activity, [id number]- Neuromuscular re-education, 97535- Self Care, [id number]- Manual therapy, [phone]- Gait training, [phone]- Canalith repositioning, Patient/Family education, Balance training, and Vestibular training  PLAN FOR NEXT SESSION: UPDATE HEP! saccades, somatosensory training   Dawon Troop, PT 07/05/2024, 10:58 AM

## 2024-07-12 ENCOUNTER — Ambulatory Visit (INDEPENDENT_AMBULATORY_CARE_PROVIDER_SITE_OTHER)

## 2024-07-12 ENCOUNTER — Encounter: Payer: Self-pay | Admitting: Physical Therapy

## 2024-07-12 ENCOUNTER — Ambulatory Visit: Admitting: Physical Therapy

## 2024-07-12 VITALS — BP 108/61 | HR 63 | Temp 98.1°F

## 2024-07-12 DIAGNOSIS — R42 Dizziness and giddiness: Secondary | ICD-10-CM

## 2024-07-12 DIAGNOSIS — E538 Deficiency of other specified B group vitamins: Secondary | ICD-10-CM

## 2024-07-12 MED ORDER — CYANOCOBALAMIN 1000 MCG/ML IJ SOLN
1000.0000 ug | Freq: Once | INTRAMUSCULAR | Status: AC
  Administered 2024-07-12: 1000 ug via INTRAMUSCULAR

## 2024-07-12 NOTE — Progress Notes (Signed)
 Patient is in office today for a nurse visit to receive a Vitamin B12 Injection. Injection was given in the  Right upper quad. gluteus. Patient tolerated injection well. Patient denies muscle cramps, weakness or irregular heart rate.

## 2024-07-12 NOTE — Therapy (Signed)
 OUTPATIENT PHYSICAL THERAPY VESTIBULAR TREATMENT     Patient Name: Mia [name] MRN: [id number] DOB:1951/03/03, 73 y.o., female Today's Date: 07/12/2024  END OF SESSION:  PT End of Session - 07/12/24 1223     Visit Number 6    Number of Visits 8    Date for PT Re-Evaluation 08/04/24    Authorization Type medicare    Authorization - Visit Number 5    Progress Note Due on Visit 10    PT Start Time 1145    PT Stop Time 1218    PT Time Calculation (min) 33 min    Activity Tolerance Patient tolerated treatment well    Behavior During Therapy [facility] for tasks assessed/performed               Past Medical History:  Diagnosis Date   Anal fissure    Anemia    Arthritis    Blood transfusion    C. difficile colitis    Chest pain    a. 01/2013 MV: EF 59%, no ischemia.   Chronic Dyspnea    a. 01/2013 Echo: EF 60-65%, Gr 1 DD, PASP 31mmHg.// Echo 01/2020: EF 60-65, no RWMA, GR 1 DD, GLS -21.6%, normal RV SF, trivial MR    COVID-19    DDD (degenerative disc disease), cervical    DDD (degenerative disc disease), lumbar    DVT (deep venous thrombosis) (HCC) 06/23/2023   left lower extremity   Fibromyalgia    Gall stones    GERD (gastroesophageal reflux disease)    gastritis   Hemorrhoids    Hypertension    Interstitial cystitis    MCI (mild cognitive impairment) 11/25/2017   Memory difficulty 12/14/2013   Neuromuscular disorder (HCC)    sclerosis   Osteoporosis    Peptic ulcer    PONV (postoperative nausea and vomiting)    Pseudogout    Raynaud phenomenon    Rectal bleeding    Sleep apnea    a. on cpap.   Stress fracture of left tibia 11/05/2016   SVT (supraventricular tachycardia) (HCC)    Syncope 09/10/2015   Thyroid  disease    hypothyroidism   Tremor, essential 05/14/2016   Past Surgical History:  Procedure Laterality Date   APPENDECTOMY     BLADDER SURGERY     x2   BLADDER SURGERY     BREAST BIOPSY     CARDIAC CATHETERIZATION     CARPAL TUNNEL RELEASE      CATARACT EXTRACTION Bilateral    CHOLECYSTECTOMY     DILATION AND CURETTAGE OF UTERUS     ENTEROCELE REPAIR     x2   EYE SURGERY     retina   herniated disc  11/25/2021   L4 and L5   hysterectomy - unknown type     JOINT REPLACEMENT     KNEE ARTHROPLASTY     KNEE SURGERY     x6   NECK SURGERY     fusion   OOPHORECTOMY     QUADRICEPS REPAIR Right    RECTOCELE REPAIR     x2   SHOULDER ARTHROSCOPY WITH SUBACROMIAL DECOMPRESSION, ROTATOR CUFF REPAIR AND BICEP TENDON REPAIR  10/06/2012   Procedure: SHOULDER ARTHROSCOPY WITH SUBACROMIAL DECOMPRESSION, ROTATOR CUFF REPAIR AND BICEP TENDON REPAIR;  Surgeon: Eva Elsie Herring, MD;  Location: [facility];  Service: Orthopedics;  Laterality: Right;  Arthroscopic  Repair  of  Subscapularis, Open Biceps Tenodesis   SHOULDER SURGERY     bilateral- bones spur  SHOULDER SURGERY Left 04/15/2021   TONSILLECTOMY     TOTAL KNEE ARTHROPLASTY     TOTAL SHOULDER ARTHROPLASTY     TRANSFORAMINAL LUMBAR INTERBODY FUSION (TLIF) WITH PEDICLE SCREW FIXATION 1 LEVEL Left 04/20/2024   Procedure: TRANSFORAMINAL LUMBAR INTERBODY FUSION (TLIF) WITH PEDICLE SCREW FIXATION 1 LEVEL;  Surgeon: Beuford Anes, MD;  Location: [facility];  Service: Orthopedics;  Laterality: Left;  LEFT-SIDED LUMBAR 4 - LUMBAR 5 TRANSFORAMINAL LUMBAR INTERBODY FUSION AND DECOMPRESSION WITH INSTRUMENTATION AND ALLOGRAFT   Patient Active Problem List   Diagnosis Date Noted   Radiculopathy 04/20/2024   History of DVT of lower extremity 08/23/2023   Pain of right sacroiliac joint 10/15/2022   Palpitations 06/02/2022   Pernicious anemia 09/11/2020   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   Weakness of both lower extremities 06/11/2020   Hyponatremia 05/22/2020   Gait instability 06/20/2019   Facet arthritis of lumbar region 05/10/2018   MCI (mild cognitive impairment) 11/25/2017   Restrictive lung disease 07/30/2017   Mixed hyperlipidemia 06/18/2017   Rectocele 04/26/2017    Irritable bowel syndrome with both constipation and diarrhea 04/26/2017   Osteoporosis 04/18/2017   Trochanteric bursitis of both hips 02/24/2017   Inflammatory arthritis 09/30/2016   High risk medication use 09/30/2016   History of Clostridium difficile colitis 09/30/2016   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   Pseudogout 09/30/2016   DDD cervical spine status post fusion 09/30/2016   DDD thoracic spine 09/30/2016   H/O total knee replacement, right 09/30/2016   Age-related osteoporosis without current pathological fracture 09/30/2016   Tremor, essential 05/14/2016   Right foot pain 04/14/2016   B12 deficiency 09/10/2015   Cervical facet joint syndrome 09/10/2015   Chronic fatigue 09/10/2015   Aortic atherosclerosis (HCC) 04/01/2015   DOE (dyspnea on exertion)    Primary osteoarthritis of right knee 08/13/2014   Benign head tremor 06/11/2014   Lumbar degenerative disc disease 06/11/2014   IFG (impaired fasting glucose) 03/09/2014   Hemorrhoid 03/09/2014   Retinal wrinkling, right eye 03/09/2014   Fibromyalgia 03/09/2014   GERD (gastroesophageal reflux disease) 03/09/2014   History of arthroplasty of right knee 01/31/2014   Hemorrhoids, external, thrombosed 06/22/2011   Hypertension 03/11/2011   SVT (supraventricular tachycardia) (HCC)    Raynaud phenomenon    Nonspecific (abnormal) findings on radiological and other examination of body structure 08/25/2010   COMPUTERIZED TOMOGRAPHY, CHEST, ABNORMAL 08/25/2010   OSA (obstructive sleep apnea) 04/24/2009   Allergic rhinitis 06/11/2008   Dyspnea 06/11/2008   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   Chronic diastolic CHF (congestive heart failure) (HCC) 06/08/2008   INTERSTITIAL CYSTITIS 06/08/2008    PCP: Alvan REFERRING PROVIDER: Metheney  REFERRING DIAG: vertigo  THERAPY DIAG:  Dizziness and giddiness  ONSET DATE: 04/20/24  Rationale for Evaluation and Treatment: Rehabilitation  SUBJECTIVE:    SUBJECTIVE STATEMENT: Pt states my vertigo been awesome. She states she has not had any bad days since last visit.  PERTINENT HISTORY: Lumbar fusion 03/2024, cervical fusion Pt has c/o wooziness that comes and goes. She states some days it is ok and some days it is bad. She states she has long standing issues with vertigo but that this episode has been getting worse since recent back surgery. She states she does not know what causes dizziness but she does feel better in recliner with head supported. Dizziness increases with walking and when she tried her eye exercises from previous vestibular treatment. Pt presents to clinic using RW for ambulation and LSO due to recent surgery Pt accompanied by: self  PAIN:  Are you having pain? No  PRECAUTIONS: Fall  RED FLAGS: None   WEIGHT BEARING RESTRICTIONS: No  FALLS: Has patient fallen in last 6 months? Yes. Number of falls several prior to surgery  LIVING ENVIRONMENT: Lives with: lives with their spouse Has following equipment at home: Vannie - 2 wheeled  PLOF: Independent  PATIENT GOALS: reduce dizziness  OBJECTIVE:  Note: Objective measures were completed at Evaluation unless otherwise noted.       VESTIBULAR ASSESSMENT:     SYMPTOM BEHAVIOR:  Subjective history: see above  Non-Vestibular symptoms: nausea/vomiting  Type of dizziness: Funny feeling in the head and Swimmyheaded  Frequency: weekly  Duration: all day  Aggravating factors: Induced by motion: occur when walking and activity in general  Relieving factors: head stationary and medication  Progression of symptoms: worse  OCULOMOTOR EXAM:  Ocular Alignment: normal  Ocular ROM: No Limitations  Spontaneous Nystagmus: absent  Gaze-Induced Nystagmus: age appropriate nystagmus at end range  Smooth Pursuits: intact - some increased symptoms with Rt gaze 4/10  Saccades: dysmetria and slow  - increased symptoms 4/10     VESTIBULAR - OCULAR REFLEX:    Slow VOR: Comment: increase in symptoms 4/10  VOR Cancellation: Normal symptoms 2/10  Head-Impulse Test: HIT Right: negative HIT Left: negative increase in symptoms 4/10    POSITIONAL TESTING: Right Dix-Hallpike: no nystagmus Left Dix-Hallpike: no nystagmus Right Roll Test: no nystagmus Left Roll Test: no nystagmus  MOTION SENSITIVITY:  Motion Sensitivity Quotient Intensity: 0 = none, 1 = Lightheaded, 2 = Mild, 3 = Moderate, 4 = Severe, 5 = Vomiting  Intensity  1. Sitting to supine   2. Supine to L side   3. Supine to R side   4. Supine to sitting   5. L Hallpike-Dix   6. Up from L    7. R Hallpike-Dix   8. Up from R    9. Sitting, head tipped to L knee   10. Head up from L knee   11. Sitting, head tipped to R knee   12. Head up from R knee   13. Sitting head turns x5   14.Sitting head nods x5   15. In stance, 180 turn to L    16. In stance, 180 turn to R        [facility] Adult PT Treatment:                                                DATE: 07/12/24  Neuromuscular re-ed: Standing quarter turns with spotting SBA Standing 180 degree turns SBA today  Gait: Gait outdoors with pt with no increase in wooziness. Pt does veer laterally outside of path and requires CGA for balance especially with incline/decline Walking with head turns reading cards without increase in symptoms   [facility] Adult PT Treatment:                                                DATE: 07/05/24  Neuromuscular re-ed: Seated spotting objects with head turns -- woozy after 8 reps Standing spotting objects with head turns horizontal - SBA Standing quarter turns with spotting SBA Standing 180 degree turns CGA today Gait with turns with spotting with CGA -  less woozy  Seated VOR x 1 horizontal - up to 6 reps before symptoms 6/10 VOR x 1 vertical - no symptoms Seated gaze stabilization with 2 objects - able to complete 8 reps Standing EC 2 x 10 sec with SBA - no LOB   [facility] Adult PT Treatment:                                                 DATE: 06/28/24  Neuromuscular re-ed: Seated spotting objects with head turns -- pt reports more difficulty with turning Lt Standing spotting objects with head turns horizontal and vertical, increasing amplitude as appropriate - SBA Standing quarter turns with spotting with CGA for balance Attempted 180 degree turns - pt requires mod A for LOB Seated gaze stabilization with 2 objects    PATIENT EDUCATION: Education details: PT POC and goals, HEP Person educated: Patient Education method: Explanation, Demonstration, and Handouts Education comprehension: verbalized understanding and returned demonstration  HOME EXERCISE PROGRAM: Access Code: [email or web address] URL: [email or web address] Date: 06/28/2024 Prepared by: Darice Conine  Exercises - Standing Gaze Stabilization with Two Near Targets and Head Rotation  - 3 x daily - 7 x weekly - 3 sets - 10 reps - Standing Quarter Turn with Counter Support  - 1 x daily - 7 x weekly - 3 sets - 10 reps   GOALS: Goals reviewed with patient? Yes  SHORT TERM GOALS: Target date: 07/07/2024    Pt will be independent with initial HEP Baseline: Goal status: MET    LONG TERM GOALS: Target date: 08/04/2024    Pt will be independent with advanced HEP Baseline:  Goal status: INITIAL  2.  Pt will report instances of wooziness <= 1/week Baseline:  Goal status: MET  3.  Pt will report symptoms lasting <= 2 hours Baseline:  Goal status: MET  4.  Pt will tolerate walking x 10 minutes without increase in symptoms Baseline:  Goal status: MET    ASSESSMENT:  CLINICAL IMPRESSION: Pt has met goals and has no more symptoms of wooziness. Pt does have significant balance deficits and requires CGA for walking on uneven surfaces. Pt wants to wait to address balance deficits until she is cleared to begin PT for her back. Plan to hold PT at this time and address balance in a new episode of  care  OBJECTIVE IMPAIRMENTS: decreased activity tolerance, decreased balance, and dizziness.      PLAN:  PT FREQUENCY: 1x/week  PT DURATION: 8 weeks  PLANNED INTERVENTIONS: [id number]- PT Re-evaluation, 97110-Therapeutic exercises, 97530- Therapeutic activity, [id number]- Neuromuscular re-education, 97535- Self Care, [id number]- Manual therapy, [phone]- Gait training, [phone]- Canalith repositioning, Patient/Family education, Balance training, and Vestibular training  PLAN FOR NEXT SESSION: hold PT - plan to reassess if wooziness returns   Anntonette Madewell, PT 07/12/2024, 12:25 PM

## 2024-07-14 DIAGNOSIS — M545 Low back pain, unspecified: Secondary | ICD-10-CM | POA: Diagnosis not present

## 2024-07-14 DIAGNOSIS — M7062 Trochanteric bursitis, left hip: Secondary | ICD-10-CM | POA: Diagnosis not present

## 2024-07-19 DIAGNOSIS — Z1231 Encounter for screening mammogram for malignant neoplasm of breast: Secondary | ICD-10-CM | POA: Diagnosis not present

## 2024-07-19 LAB — HM MAMMOGRAPHY

## 2024-07-25 ENCOUNTER — Encounter: Payer: Self-pay | Admitting: Cardiology

## 2024-07-25 ENCOUNTER — Ambulatory Visit: Attending: Cardiology | Admitting: Cardiology

## 2024-07-25 VITALS — BP 143/86 | HR 68 | Ht 66.0 in | Wt 167.0 lb

## 2024-07-25 DIAGNOSIS — I10 Essential (primary) hypertension: Secondary | ICD-10-CM | POA: Diagnosis not present

## 2024-07-25 DIAGNOSIS — E785 Hyperlipidemia, unspecified: Secondary | ICD-10-CM | POA: Diagnosis not present

## 2024-07-25 MED ORDER — AMLODIPINE BESYLATE 5 MG PO TABS: 5.0000 mg | ORAL_TABLET | Freq: Two times a day (BID) | ORAL | 3 refills | Status: AC

## 2024-07-25 NOTE — Patient Instructions (Signed)
 Medication Instructions:  Please increase Amlodipine  to 5 mg twice. Continue all other medications as listed.  *If you need a refill on your cardiac medications before your next appointment, please call your pharmacy*  Follow-Up: At [facility], you and your health needs are our priority.  As part of our continuing mission to provide you with exceptional heart care, our providers are all part of one team.  This team includes your primary Cardiologist (physician) and Advanced Practice Providers or APPs (Physician Assistants and Nurse Practitioners) who all work together to provide you with the care you need, when you need it.  Your next appointment:   1 year(s)  Provider:   Dr Oneil Parchment      We recommend signing up for the patient portal called MyChart.  Sign up information is provided on this After Visit Summary.  MyChart is used to connect with patients for Virtual Visits (Telemedicine).  Patients are able to view lab/test results, encounter notes, upcoming appointments, etc.  Non-urgent messages can be sent to your provider as well.   To learn more about what you can do with MyChart, go to [email or web address].

## 2024-07-25 NOTE — Progress Notes (Signed)
 Cardiology Office Note:  .   Date:  07/25/2024  ID:  Mia [name], DOB December 20, 1950, MRN [id number] PCP: Alvan Dorothyann BIRCH, MD  [facility] Providers Cardiologist:  Oneil Parchment, MD     History of Present Illness: .   Mia [name] is a 73 y.o. female Discussed the use of AI scribe software  History of Present Illness Mia [name] is a 73 year old female with supraventricular tachycardia and hypertension who presents for follow-up.  She has a history of supraventricular tachycardia (SVT) diagnosed previously. She recalls an episode where her heart rate exceeded 230 bpm, though she did not experience any symptoms. She cannot feel the SVT when it occurs, but she notices headaches and a general feeling of unwellness when her blood pressure is affected. She is currently on metoprolol  for SVT.  She takes valsartan  and metoprolol  for hypertension. However, she experiences fluctuations in her blood pressure, particularly in the afternoon, with readings sometimes reaching 197/92 mmHg. She takes amlodipine  5 mg as needed for elevated blood pressure. She reports significant variations in her blood pressure throughout the day. She experiences difficulty breathing and headaches associated with high blood pressure. No anxiety is reported.  She has a history of Raynaud's phenomenon, diabetes, minimal coronary artery disease diagnosed by catheterization in 2006, and sleep apnea. Her hyperlipidemia is characterized by an LDL of 116 mg/dL and HDL of 56 mg/dL. She continues to take Eliquis  5 mg twice a day for chronic thrombosis and superficial thrombophlebitis.  She has persistent and exhausting vestibular dysfunction, for which she has attended rehabilitation. She finds the rehabilitation necessary but tiring.  She recalls a past incident where she was hospitalized in [place]  due to a blood pressure reading over 230 mmHg, discovered [facility]. She was treated with IV  medication during her hospital stay.     Studies Reviewed: SABRA   EKG Interpretation Date/Time:  Tuesday July 25 2024 15:42:15 EDT Ventricular Rate:  68 PR Interval:  148 QRS Duration:  84 QT Interval:  402 QTC Calculation: 427 R Axis:   32  Text Interpretation: Normal sinus rhythm Low voltage QRS When compared with ECG of 31-May-2023 20:33, No significant change since last tracing Confirmed by Parchment Oneil ([id number]) on 07/25/2024 3:44:52 PM    Results LABS LDL: 116 HDL: 56  RADIOLOGY PECT: Normal (2024)  DIAGNOSTIC EKG: Sinus rhythm 68 bpm, slightly low voltage Cardiac catheterization: Minimal coronary artery disease (2006) Risk Assessment/Calculations:           Physical Exam:   VS:  BP (!) 143/86   Pulse 68   Ht 5' 6 (1.676 m)   Wt 167 lb (75.8 kg)   SpO2 93%   BMI 26.95 kg/m    Wt Readings from Last 3 Encounters:  07/25/24 167 lb (75.8 kg)  06/01/24 137 lb 1.3 oz (62.2 kg)  05/30/24 156 lb (70.8 kg)    GEN: Well nourished, well developed in no acute distress NECK: No JVD; No carotid bruits CARDIAC: RRR, no murmurs, no rubs, no gallops RESPIRATORY:  Clear to auscultation without rales, wheezing or rhonchi  ABDOMEN: Soft, non-tender, non-distended EXTREMITIES:  No edema; No deformity   ASSESSMENT AND PLAN: .    Assessment and Plan Assessment & Plan Hypertension with labile blood pressure Experiencing fluctuations throughout the day with blood pressure reaching as high as 230/92 mmHg. Reports headaches and feeling unwell when blood pressure is elevated. Current regimen includes valsartan ,  metoprolol , and amlodipine . Blood pressure variability may be related to timing of medication administration or other factors such as anxiety, although she does not report feeling anxious. - Adjust amlodipine  to 5 mg twice a day to better control blood pressure fluctuations. - Continue valsartan  and metoprolol  as currently prescribed. - Advise to sit and relax if blood  pressure is elevated, and retake after a period of rest.  Paroxysmal supraventricular tachycardia (SVT) Previously diagnosed and managed with metoprolol . No current symptoms of SVT reported, and recent monitoring showed no episodes. - Continue metoprolol  as prescribed to manage SVT.  Coronary artery disease, minimal disease by catheterization Minimal coronary artery disease confirmed by catheterization in 2006.   Hyperlipidemia LDL of 116 mg/dL and HDL of 56 mg/dL.         Dispo: 1 yr  Signed, Oneil Parchment, MD

## 2024-07-26 ENCOUNTER — Ambulatory Visit: Attending: Family Medicine | Admitting: Physical Therapy

## 2024-07-26 ENCOUNTER — Other Ambulatory Visit: Payer: Self-pay

## 2024-07-26 ENCOUNTER — Encounter: Payer: Self-pay | Admitting: Physical Therapy

## 2024-07-26 DIAGNOSIS — M6281 Muscle weakness (generalized): Secondary | ICD-10-CM | POA: Diagnosis not present

## 2024-07-26 DIAGNOSIS — R42 Dizziness and giddiness: Secondary | ICD-10-CM | POA: Diagnosis not present

## 2024-07-26 DIAGNOSIS — M5459 Other low back pain: Secondary | ICD-10-CM | POA: Insufficient documentation

## 2024-07-26 NOTE — Therapy (Signed)
 OUTPATIENT PHYSICAL THERAPY THORACOLUMBAR EVALUATION   Patient Name: Mia [name] MRN: [id number] DOB:05-26-1951, 73 y.o., female Today's Date: 07/26/2024  END OF SESSION:  PT End of Session - 07/26/24 1104     Visit Number 1    Number of Visits 8    Date for Recertification  09/20/24    Authorization Type medicare    Progress Note Due on Visit 10    PT Start Time 1015    PT Stop Time 1058    PT Time Calculation (min) 43 min    Activity Tolerance Patient tolerated treatment well    Behavior During Therapy [facility] for tasks assessed/performed          Past Medical History:  Diagnosis Date   Anal fissure    Anemia    Arthritis    Blood transfusion    C. difficile colitis    Chest pain    a. 01/2013 MV: EF 59%, no ischemia.   Chronic Dyspnea    a. 01/2013 Echo: EF 60-65%, Gr 1 DD, PASP 75mmHg.// Echo 01/2020: EF 60-65, no RWMA, GR 1 DD, GLS -21.6%, normal RV SF, trivial MR    COVID-19    DDD (degenerative disc disease), cervical    DDD (degenerative disc disease), lumbar    DVT (deep venous thrombosis) (HCC) 06/23/2023   left lower extremity   Fibromyalgia    Gall stones    GERD (gastroesophageal reflux disease)    gastritis   Hemorrhoids    Hypertension    Interstitial cystitis    MCI (mild cognitive impairment) 11/25/2017   Memory difficulty 12/14/2013   Neuromuscular disorder (HCC)    sclerosis   Osteoporosis    Peptic ulcer    PONV (postoperative nausea and vomiting)    Pseudogout    Raynaud phenomenon    Rectal bleeding    Sleep apnea    a. on cpap.   Stress fracture of left tibia 11/05/2016   SVT (supraventricular tachycardia)    Syncope 09/10/2015   Thyroid  disease    hypothyroidism   Tremor, essential 05/14/2016   Past Surgical History:  Procedure Laterality Date   APPENDECTOMY     BLADDER SURGERY     x2   BLADDER SURGERY     BREAST BIOPSY     CARDIAC CATHETERIZATION     CARPAL TUNNEL RELEASE     CATARACT EXTRACTION Bilateral     CHOLECYSTECTOMY     DILATION AND CURETTAGE OF UTERUS     ENTEROCELE REPAIR     x2   EYE SURGERY     retina   herniated disc  11/25/2021   L4 and L5   hysterectomy - unknown type     JOINT REPLACEMENT     KNEE ARTHROPLASTY     KNEE SURGERY     x6   NECK SURGERY     fusion   OOPHORECTOMY     QUADRICEPS REPAIR Right    RECTOCELE REPAIR     x2   SHOULDER ARTHROSCOPY WITH SUBACROMIAL DECOMPRESSION, ROTATOR CUFF REPAIR AND BICEP TENDON REPAIR  10/06/2012   Procedure: SHOULDER ARTHROSCOPY WITH SUBACROMIAL DECOMPRESSION, ROTATOR CUFF REPAIR AND BICEP TENDON REPAIR;  Surgeon: Eva Elsie Herring, MD;  Location:  S[facility]ERY [facility];  Service: Orthopedics;  Laterality: Right;  Arthroscopic  Repair  of  Subscapularis, Open Biceps Tenodesis   SHOULDER SURGERY     bilateral- bones spur   SHOULDER SURGERY Left 04/15/2021   TONSILLECTOMY     TOTAL KNEE ARTHROPLASTY  TOTAL SHOULDER ARTHROPLASTY     TRANSFORAMINAL LUMBAR INTERBODY FUSION (TLIF) WITH PEDICLE SCREW FIXATION 1 LEVEL Left 04/20/2024   Procedure: TRANSFORAMINAL LUMBAR INTERBODY FUSION (TLIF) WITH PEDICLE SCREW FIXATION 1 LEVEL;  Surgeon: Beuford Anes, MD;  Location: [facility];  Service: Orthopedics;  Laterality: Left;  LEFT-SIDED LUMBAR 4 - LUMBAR 5 TRANSFORAMINAL LUMBAR INTERBODY FUSION AND DECOMPRESSION WITH INSTRUMENTATION AND ALLOGRAFT   Patient Active Problem List   Diagnosis Date Noted   Radiculopathy 04/20/2024   History of DVT of lower extremity 08/23/2023   Pain of right sacroiliac joint 10/15/2022   Palpitations 06/02/2022   Pernicious anemia 09/11/2020   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   Weakness of both lower extremities 06/11/2020   Hyponatremia 05/22/2020   Gait instability 06/20/2019   Facet arthritis of lumbar region 05/10/2018   MCI (mild cognitive impairment) 11/25/2017   Restrictive lung disease 07/30/2017   Mixed hyperlipidemia 06/18/2017   Rectocele 04/26/2017   Irritable bowel syndrome with both  constipation and diarrhea 04/26/2017   Osteoporosis 04/18/2017   Trochanteric bursitis of both hips 02/24/2017   Inflammatory arthritis 09/30/2016   High risk medication use 09/30/2016   History of Clostridium difficile colitis 09/30/2016   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   Pseudogout 09/30/2016   DDD cervical spine status post fusion 09/30/2016   DDD thoracic spine 09/30/2016   H/O total knee replacement, right 09/30/2016   Age-related osteoporosis without current pathological fracture 09/30/2016   Tremor, essential 05/14/2016   Right foot pain 04/14/2016   B12 deficiency 09/10/2015   Cervical facet joint syndrome 09/10/2015   Chronic fatigue 09/10/2015   Aortic atherosclerosis 04/01/2015   DOE (dyspnea on exertion)    Primary osteoarthritis of right knee 08/13/2014   Benign head tremor 06/11/2014   Lumbar degenerative disc disease 06/11/2014   IFG (impaired fasting glucose) 03/09/2014   Hemorrhoid 03/09/2014   Retinal wrinkling, right eye 03/09/2014   Fibromyalgia 03/09/2014   GERD (gastroesophageal reflux disease) 03/09/2014   History of arthroplasty of right knee 01/31/2014   Hemorrhoids, external, thrombosed 06/22/2011   Hypertension 03/11/2011   SVT (supraventricular tachycardia)    Raynaud phenomenon    Nonspecific (abnormal) findings on radiological and other examination of body structure 08/25/2010   COMPUTERIZED TOMOGRAPHY, CHEST, ABNORMAL 08/25/2010   OSA (obstructive sleep apnea) 04/24/2009   Allergic rhinitis 06/11/2008   Dyspnea 06/11/2008   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   Chronic diastolic CHF (congestive heart failure) (HCC) 06/08/2008   INTERSTITIAL CYSTITIS 06/08/2008    PCP: Alvan  REFERRING PROVIDER: Beuford Anes  REFERRING DIAG: s/p Lumbar fusion, Lt trochanteric bursitis  Rationale for Evaluation and Treatment: Rehabilitation  THERAPY DIAG:  Muscle weakness (generalized)  Other low back pain  ONSET  DATE: 04/20/24  SUBJECTIVE:  SUBJECTIVE STATEMENT: Pt is s/p L4/5 fusion on 04/20/24. She had been wearing back brace until 2 weeks ago and is now cleared for all activity. She states she has the most difficulty with bending and with walking longer than 20-30 minutes. She states she has pain in her mid back and Lt lower back. Pt has been using tylenol  and lidocaine  patches for pain control. Pt also has Lt trochanteric bursitis and had an injection 07/14/24.  PERTINENT HISTORY:  DDD, osteoporosis, vertigo  PAIN:  Are you having pain? Yes: NPRS scale: 6/10 Pain location: Lt low back Pain description: sore, ache Aggravating factors: walking, bending Relieving factors: meds, pain patch  PRECAUTIONS: Fall and Other: per pt MD recommends no heavy lifting, osteoporosis  RED FLAGS: None   WEIGHT BEARING RESTRICTIONS: No  FALLS:  Has patient fallen in last 6 months? No   OCCUPATION: enjoys home projects, community activities  PLOF: Independent  PATIENT GOALS: decrease pain  NEXT MD VISIT: December 2025  OBJECTIVE:  Note: Objective measures were completed at Evaluation unless otherwise noted.  DIAGNOSTIC FINDINGS:  L4/5 fusion  PATIENT SURVEYS:  Modified Oswestry: 17/50   COGNITION: Overall cognitive status: Within functional limits for tasks assessed       POSTURE:  Low tone PALPATION: Mild increase in mm spasticity bilat lumbar paraspinals  LUMBAR ROM:   AROM eval  Flexion 70%  Extension To neutral  - pain  Right lateral flexion WFL - pain end range  Left lateral flexion WFL  Right rotation 50% - pain Lt side  Left rotation 70%   (Blank rows = not tested)  LOWER EXTREMITY ROM:    Grossly WFL bilat hips   LOWER EXTREMITY MMT:    MMT Right eval Left eval  Hip flexion 3- 3   Hip extension 3- 3  Hip abduction 3- 3  Hip adduction    Hip internal rotation    Hip external rotation 3- 3  Knee flexion    Knee extension    Ankle dorsiflexion    Ankle plantarflexion    Ankle inversion    Ankle eversion     (Blank rows = not tested)  LUMBAR SPECIAL TESTS:  Straight leg raise test: Negative    GAIT: Distance walked: 100' Assistive device utilized: None Level of assistance: Complete Independence Comments: wide BOS  TREATMENT DATE: 07/26/24 See HEP Pt educated on PT POC and goals, HEP, rationale for treatment                                                                                                                                 PATIENT EDUCATION:  Education details: PT POC and goals, HEP Person educated: Patient Education method: Explanation, Demonstration, and Handouts Education comprehension: verbalized understanding and returned demonstration  HOME EXERCISE PROGRAM: Access Code: [email or web address] URL: [email or web address] Date: 07/26/2024 Prepared by: Darice Conine  Exercises - Supine March  - 1 x daily - 7 x weekly -  2 sets - 10 reps - Supine Bridge  - 1 x daily - 7 x weekly - 2 sets - 10 reps - Hooklying Clamshell with Resistance  - 1 x daily - 7 x weekly - 2 sets - 10 reps  ASSESSMENT:  CLINICAL IMPRESSION: Patient is a 73 y.o. female who was seen today for physical therapy evaluation and treatment for s/p lumbar fusion and Lt trochanteric bursitis. Pt presents with significant deficits in hip and core strength, impaired gait, balance and decreased functional activity tolerance. Pt will benefit from skilled PT to address deficits and improve functional mobility.   OBJECTIVE IMPAIRMENTS: decreased activity tolerance, decreased ROM, decreased strength, postural dysfunction, and pain.   ACTIVITY LIMITATIONS: lifting, bending, standing, and locomotion level  PARTICIPATION LIMITATIONS: shopping, community activity, and yard  work  PERSONAL FACTORS: Past/current experiences, Time since onset of injury/illness/exacerbation, and 1-2 comorbidities: bursitis, neuromuscular disorder are also affecting patient's functional outcome.   REHAB POTENTIAL: Good  CLINICAL DECISION MAKING: Evolving/moderate complexity  EVALUATION COMPLEXITY: Moderate   GOALS: Goals reviewed with patient? Yes  SHORT TERM GOALS: Target date: 08/23/2024    Pt will be independent in initial HEP Baseline: Goal status: INITIAL  2.  Pt will improve LE strength to 4/5 to improve standing and walking tolerance Baseline:  Goal status: INITIAL    LONG TERM GOALS: Target date: 09/20/2024    Pt will be independent with advanced HEP Baseline:  Goal status: INITIAL  2.  Pt will improve modified oswestry to <= 7/50 to demo improved functional mobility Baseline:  Goal status: INITIAL  3.  Pt will tolerate walking x 30 minutes with pain <= 2/10 to complete shopping tasks Baseline:  Goal status: INITIAL  4.  Pt will be able to bend to pick up small item dropped on floor with pain <= 2/10 Baseline:  Goal status: INITIAL   PLAN:  PT FREQUENCY: 1-2x/week  PT DURATION: 8 weeks  PLANNED INTERVENTIONS: 97164- PT Re-evaluation, 97110-Therapeutic exercises, 97530- Therapeutic activity, 97112- Neuromuscular re-education, 97535- Self Care, 02859- Manual therapy, G0283- Electrical stimulation (unattended), 20560 (1-2 muscles), 20561 (3+ muscles)- Dry Needling, Patient/Family education, Balance training, Taping, Cryotherapy, and Moist heat.  PLAN FOR NEXT SESSION: Assess response to HEP, hip and core strength   Chealsea Paske, PT 07/26/2024, 11:05 AM

## 2024-07-28 ENCOUNTER — Ambulatory Visit: Payer: Self-pay

## 2024-07-28 ENCOUNTER — Ambulatory Visit: Admitting: Family Medicine

## 2024-07-28 VITALS — BP 138/78 | HR 69 | Temp 97.8°F | Ht 66.0 in | Wt 167.6 lb

## 2024-07-28 DIAGNOSIS — J029 Acute pharyngitis, unspecified: Secondary | ICD-10-CM

## 2024-07-28 DIAGNOSIS — R519 Headache, unspecified: Secondary | ICD-10-CM | POA: Diagnosis not present

## 2024-07-28 DIAGNOSIS — G4489 Other headache syndrome: Secondary | ICD-10-CM | POA: Diagnosis not present

## 2024-07-28 MED ORDER — PREDNISONE 20 MG PO TABS: ORAL_TABLET | ORAL | 0 refills | Status: DC

## 2024-07-28 NOTE — Telephone Encounter (Signed)
 FYI Only or Action Required?: FYI only for provider.  Patient was last seen in primary care on 06/01/2024 by Mia Dorothyann BIRCH, MD.  Called Nurse Triage reporting Headache.  Symptoms began several days ago.  Interventions attempted: OTC medications: sinus medication, tylenol  and Prescription medications: hydromorphone .  Symptoms are: gradually worsening.  Triage Disposition: See HCP Within 4 Hours (Or PCP Triage)  Patient/caregiver understands and will follow disposition?: Yes     Copied from CRM [id number]. Topic: Clinical - Red Word Triage >> Jul 28, 2024  1:19 PM Mia [name] wrote: Red Word that prompted transfer to Nurse Triage: Patient stated that for the last couple of days she has pain on top of left eye and right behind bone of left ear, cannot sleep due to pain. Reason for Disposition  [1] SEVERE headache (e.g., excruciating) AND [2] not improved after 2 hours of pain medicine  Answer Assessment - Initial Assessment Questions Pt states pain in the bone behind her left ear and above her left eye. She states that she has has sinus infections before and initially thought that is what is was so she started taking sinus medication and tylenol  and used a lidocaine  patch on the area behind her ear. She states the pain got so bad today she took one of her hydromorphones.    1. LOCATION: Where does it hurt?      Bone behind left ear and on top of left eye above eye brow 2. ONSET: When did the headache start? (e.g., minutes, hours, days)       Started 3-4 days ago 3. PATTERN: Does the pain come and go, or has it been constant since it started?     constant 4. SEVERITY: How bad is the pain? and What does it keep you from doing?  (e.g., Scale 1-10; mild, moderate, or severe)     9-10, sharp pain no stabbing 5. RECURRENT SYMPTOM: Have you ever had headaches before? If Yes, ask: When was the last time? and What happened that time?      Sinus before 6. CAUSE: What do  you think is causing the headache?     unsure  8. HEAD INJURY: Has there been any recent injury to your head?      no 9. OTHER SYMPTOMS: Do you have any other symptoms? (e.g., fever, stiff neck, eye pain, sore throat, cold symptoms)     No , chills, hasn't checked temperature  Protocols used: Headache-A-AH

## 2024-07-28 NOTE — Progress Notes (Signed)
 OFFICE VISIT  07/28/2024  CC:  Chief Complaint  Patient presents with   Ear Pain     Patient stated that for the last couple of days she has pain on top of left eye and right behind bone of left ear, cannot sleep due to pain. Has been using sinus medication, using Tylenol  and lidocaine  patch.    Patient is a 73 y.o. female who presents accompanied by her husband for left forehead pain and sore throat.  HPI: About 2 days ago she started to feel significant pain around the left eye, most prominently over her eyebrow but also a vague/less severe pain above that and laterally on the temple.  Additionally, she feels a significant pain over the left mastoid region.  She has had a sore throat during this time.  No eye drooping, visual abnormality, or eye drainage or redness.  No rash/skin lesions. She has a chronic runny nose, nothing new.  No nasal congestion or maxillary pain or teeth pain.  No cough.  She had some chills yesterday but did not take her temperature. She took some over-the-counter generic sinus medication. She is taking Tylenol  round-the-clock. She took some hydromorphone  that she had been prescribed after back surgery and this did not help either.  Review of systems: No focal weakness or paresthesias.  No tremor.  No lightheadedness or vertigo.  No nausea, vomiting, abdominal pain, or diarrhea.   Past Medical History:  Diagnosis Date   Anal fissure    Anemia    Arthritis    Blood transfusion    C. difficile colitis    Chest pain    a. 01/2013 MV: EF 59%, no ischemia.   Chronic Dyspnea    a. 01/2013 Echo: EF 60-65%, Gr 1 DD, PASP 51mmHg.// Echo 01/2020: EF 60-65, no RWMA, GR 1 DD, GLS -21.6%, normal RV SF, trivial MR    COVID-19    DDD (degenerative disc disease), cervical    DDD (degenerative disc disease), lumbar    DVT (deep venous thrombosis) (HCC) 06/23/2023   left lower extremity   Fibromyalgia    Gall stones    GERD (gastroesophageal reflux disease)    gastritis    Hemorrhoids    Hypertension    Interstitial cystitis    MCI (mild cognitive impairment) 11/25/2017   Memory difficulty 12/14/2013   Neuromuscular disorder (HCC)    sclerosis   Osteoporosis    Peptic ulcer    PONV (postoperative nausea and vomiting)    Pseudogout    Raynaud phenomenon    Rectal bleeding    Sleep apnea    a. on cpap.   Stress fracture of left tibia 11/05/2016   SVT (supraventricular tachycardia)    Syncope 09/10/2015   Thyroid  disease    hypothyroidism   Tremor, essential 05/14/2016    Past Surgical History:  Procedure Laterality Date   APPENDECTOMY     BLADDER SURGERY     x2   BLADDER SURGERY     BREAST BIOPSY     CARDIAC CATHETERIZATION     CARPAL TUNNEL RELEASE     CATARACT EXTRACTION Bilateral    CHOLECYSTECTOMY     DILATION AND CURETTAGE OF UTERUS     ENTEROCELE REPAIR     x2   EYE SURGERY     retina   herniated disc  11/25/2021   L4 and L5   hysterectomy - unknown type     JOINT REPLACEMENT     KNEE ARTHROPLASTY  KNEE SURGERY     x6   NECK SURGERY     fusion   OOPHORECTOMY     QUADRICEPS REPAIR Right    RECTOCELE REPAIR     x2   SHOULDER ARTHROSCOPY WITH SUBACROMIAL DECOMPRESSION, ROTATOR CUFF REPAIR AND BICEP TENDON REPAIR  10/06/2012   Procedure: SHOULDER ARTHROSCOPY WITH SUBACROMIAL DECOMPRESSION, ROTATOR CUFF REPAIR AND BICEP TENDON REPAIR;  Surgeon: Eva Elsie Herring, MD;  Location: [facility];  Service: Orthopedics;  Laterality: Right;  Arthroscopic  Repair  of  Subscapularis, Open Biceps Tenodesis   SHOULDER SURGERY     bilateral- bones spur   SHOULDER SURGERY Left 04/15/2021   TONSILLECTOMY     TOTAL KNEE ARTHROPLASTY     TOTAL SHOULDER ARTHROPLASTY     TRANSFORAMINAL LUMBAR INTERBODY FUSION (TLIF) WITH PEDICLE SCREW FIXATION 1 LEVEL Left 04/20/2024   Procedure: TRANSFORAMINAL LUMBAR INTERBODY FUSION (TLIF) WITH PEDICLE SCREW FIXATION 1 LEVEL;  Surgeon: Beuford Anes, MD;  Location: [facility];  Service:  Orthopedics;  Laterality: Left;  LEFT-SIDED LUMBAR 4 - LUMBAR 5 TRANSFORAMINAL LUMBAR INTERBODY FUSION AND DECOMPRESSION WITH INSTRUMENTATION AND ALLOGRAFT      Allergies  Allergen Reactions   Codeine Nausea And Vomiting    Other Reaction(s): UNKNOWN   Myrbetriq  [Mirabegron] Other (See Comments)    SEVERE HEADACHE   Nystatin Other (See Comments)    Unknown reaction   Percocet [Oxycodone-Acetaminophen ] Nausea And Vomiting   Propoxyphene Nausea And Vomiting    Other Reaction(s): UNKNOWN   Acetaminophen  Other (See Comments)   Erythromycin Base     Other Reaction(s): UNKNOWN   Ketorolac      Other Reaction(s): UNKNOWN   Methadone    Oxycodone-Acetaminophen  Nausea Only   Celecoxib Other (See Comments)    Headache/flushed     Darvocet [Propoxyphene N-Acetaminophen ] Nausea And Vomiting   Erythromycin Nausea And Vomiting    Nausea and vomiting    Hydrocodone  Nausea And Vomiting   Nitrofurantoin Nausea And Vomiting    Other Reaction(s): UNKNOWN   Pregabalin  Swelling    Legs swelling     Toradol  [Ketorolac  Tromethamine ] Nausea And Vomiting    Per patient, only PO form causes nausea and vomiting. Can take injection without issue   Tramadol Nausea And Vomiting   Verapamil Nausea And Vomiting    Review of Systems  As per HPI  PE:    07/28/2024    3:52 PM 07/25/2024    3:39 PM 07/12/2024   11:53 AM  Vitals with BMI  Height 5' 6 5' 6   Weight 167 lbs 10 oz 167 lbs   BMI 27.06 26.97   Systolic 138 143 891  Diastolic 78 86 61  Pulse 69 68 63    Physical Exam  VS: noted--normal. Gen: alert, NAD, NONTOXIC APPEARING. HEENT: eyes without injection, drainage, or swelling.  Minimal tenderness to palpation around the left orbit mainly over the eyebrow but some over the temple as well.  No erythema in this area.  Extraocular movements intact.  PERRL.  No ptosis. Ears: EACs clear, TMs with normal light reflex and landmarks.  Nose: No rhinorrhea or congestion. No paranasal  sinus TTP.  No facial swelling.  Throat and mouth without focal lesion.  No pharyngial swelling or exudate.  There is some cobblestoning of the posterior pharynx.  She has some tenderness to palpation just inferior to the mastoid bone on the left side but no palpable mass or induration. Neck: supple, no LAD.   LUNGS: CTA bilat, nonlabored  resps.   CV: RRR, no m/r/g. EXT: no c/c/e SKIN: no rash  LABS:  Last CBC Lab Results  Component Value Date   WBC 5.7 04/12/2024   HGB 13.4 04/12/2024   HCT 42.4 04/12/2024   MCV 100.2 (H) 04/12/2024   MCH 31.7 04/12/2024   RDW 13.7 04/12/2024   PLT 250 04/12/2024   Last metabolic panel Lab Results  Component Value Date   GLUCOSE 118 (H) 04/12/2024   NA 138 04/12/2024   K 4.6 04/12/2024   CL 101 04/12/2024   CO2 29 04/12/2024   BUN 5 (L) 04/12/2024   CREATININE 0.70 04/12/2024   GFRNONAA >60 04/12/2024   CALCIUM  9.8 04/12/2024   PHOS 3.2 12/15/2021   PROT 7.0 01/17/2024   ALBUMIN  4.3 01/17/2024   LABGLOB 2.6 11/15/2015   AGRATIO 1.8 11/15/2015   BILITOT 0.5 01/17/2024   ALKPHOS 215 (H) 01/17/2024   AST 68 (H) 01/17/2024   ALT 66 (H) 01/17/2024   ANIONGAP 8 04/12/2024   IMPRESSION AND PLAN:  Acute left periorbital pain, sore throat, and left mastoid pain. Question viral syndrome. Rapid flu, strep, and COVID tests all negative here today. Also must consider temporal arteritis, esp since history and exam not clearly fitting with infection. Atypical migraine/cluster headache less likely. Will check ESR and CRP. Treat empirically with prednisone , 40mg  every day x 5d. Stop sinus medication. Continue tylenol  prn and she'll avoid hydromorphone  since it did not help anywary.  An After Visit Summary was printed and given to the patient.  FOLLOW UP: No follow-ups on file.  Signed:  Gerlene Hockey, MD           07/28/2024

## 2024-07-28 NOTE — Telephone Encounter (Signed)
 Patient seen today with Dr. Mcgowen office

## 2024-07-30 ENCOUNTER — Ambulatory Visit: Payer: Self-pay | Admitting: Family Medicine

## 2024-08-01 NOTE — Therapy (Deleted)
 OUTPATIENT PHYSICAL THERAPY THORACOLUMBAR TREATMENT    Patient Name: Mia [name] MRN: [id number] DOB:1950-11-05, 73 y.o., female Today's Date: 08/01/2024  END OF SESSION:    Past Medical History:  Diagnosis Date   Anal fissure    Anemia    Arthritis    Blood transfusion    C. difficile colitis    Chest pain    a. 01/2013 MV: EF 59%, no ischemia.   Chronic Dyspnea    a. 01/2013 Echo: EF 60-65%, Gr 1 DD, PASP 74mmHg.// Echo 01/2020: EF 60-65, no RWMA, GR 1 DD, GLS -21.6%, normal RV SF, trivial MR    COVID-19    DDD (degenerative disc disease), cervical    DDD (degenerative disc disease), lumbar    DVT (deep venous thrombosis) (HCC) 06/23/2023   left lower extremity   Fibromyalgia    Gall stones    GERD (gastroesophageal reflux disease)    gastritis   Hemorrhoids    Hypertension    Interstitial cystitis    MCI (mild cognitive impairment) 11/25/2017   Memory difficulty 12/14/2013   Neuromuscular disorder (HCC)    sclerosis   Osteoporosis    Peptic ulcer    PONV (postoperative nausea and vomiting)    Pseudogout    Raynaud phenomenon    Rectal bleeding    Sleep apnea    a. on cpap.   Stress fracture of left tibia 11/05/2016   SVT (supraventricular tachycardia)    Syncope 09/10/2015   Thyroid  disease    hypothyroidism   Tremor, essential 05/14/2016   Past Surgical History:  Procedure Laterality Date   APPENDECTOMY     BLADDER SURGERY     x2   BLADDER SURGERY     BREAST BIOPSY     CARDIAC CATHETERIZATION     CARPAL TUNNEL RELEASE     CATARACT EXTRACTION Bilateral    CHOLECYSTECTOMY     DILATION AND CURETTAGE OF UTERUS     ENTEROCELE REPAIR     x2   EYE SURGERY     retina   herniated disc  11/25/2021   L4 and L5   hysterectomy - unknown type     JOINT REPLACEMENT     KNEE ARTHROPLASTY     KNEE SURGERY     x6   NECK SURGERY     fusion   OOPHORECTOMY     QUADRICEPS REPAIR Right    RECTOCELE REPAIR     x2   SHOULDER ARTHROSCOPY WITH SUBACROMIAL  DECOMPRESSION, ROTATOR CUFF REPAIR AND BICEP TENDON REPAIR  10/06/2012   Procedure: SHOULDER ARTHROSCOPY WITH SUBACROMIAL DECOMPRESSION, ROTATOR CUFF REPAIR AND BICEP TENDON REPAIR;  Surgeon: Eva Elsie Herring, MD;  Location: [facility];  Service: Orthopedics;  Laterality: Right;  Arthroscopic  Repair  of  Subscapularis, Open Biceps Tenodesis   SHOULDER SURGERY     bilateral- bones spur   SHOULDER SURGERY Left 04/15/2021   TONSILLECTOMY     TOTAL KNEE ARTHROPLASTY     TOTAL SHOULDER ARTHROPLASTY     TRANSFORAMINAL LUMBAR INTERBODY FUSION (TLIF) WITH PEDICLE SCREW FIXATION 1 LEVEL Left 04/20/2024   Procedure: TRANSFORAMINAL LUMBAR INTERBODY FUSION (TLIF) WITH PEDICLE SCREW FIXATION 1 LEVEL;  Surgeon: Beuford Anes, MD;  Location: [facility];  Service: Orthopedics;  Laterality: Left;  LEFT-SIDED LUMBAR 4 - LUMBAR 5 TRANSFORAMINAL LUMBAR INTERBODY FUSION AND DECOMPRESSION WITH INSTRUMENTATION AND ALLOGRAFT   Patient Active Problem List   Diagnosis Date Noted   Radiculopathy 04/20/2024   History of DVT of lower  extremity 08/23/2023   Pain of right sacroiliac joint 10/15/2022   Palpitations 06/02/2022   Pernicious anemia 09/11/2020   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   Weakness of both lower extremities 06/11/2020   Hyponatremia 05/22/2020   Gait instability 06/20/2019   Facet arthritis of lumbar region 05/10/2018   MCI (mild cognitive impairment) 11/25/2017   Restrictive lung disease 07/30/2017   Mixed hyperlipidemia 06/18/2017   Rectocele 04/26/2017   Irritable bowel syndrome with both constipation and diarrhea 04/26/2017   Osteoporosis 04/18/2017   Trochanteric bursitis of both hips 02/24/2017   Inflammatory arthritis 09/30/2016   High risk medication use 09/30/2016   History of Clostridium difficile colitis 09/30/2016   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   Pseudogout 09/30/2016   DDD cervical spine status post fusion 09/30/2016   DDD thoracic spine 09/30/2016    H/O total knee replacement, right 09/30/2016   Age-related osteoporosis without current pathological fracture 09/30/2016   Tremor, essential 05/14/2016   Right foot pain 04/14/2016   B12 deficiency 09/10/2015   Cervical facet joint syndrome 09/10/2015   Chronic fatigue 09/10/2015   Aortic atherosclerosis 04/01/2015   DOE (dyspnea on exertion)    Primary osteoarthritis of right knee 08/13/2014   Benign head tremor 06/11/2014   Lumbar degenerative disc disease 06/11/2014   IFG (impaired fasting glucose) 03/09/2014   Hemorrhoid 03/09/2014   Retinal wrinkling, right eye 03/09/2014   Fibromyalgia 03/09/2014   GERD (gastroesophageal reflux disease) 03/09/2014   History of arthroplasty of right knee 01/31/2014   Hemorrhoids, external, thrombosed 06/22/2011   Hypertension 03/11/2011   SVT (supraventricular tachycardia)    Raynaud phenomenon    Nonspecific (abnormal) findings on radiological and other examination of body structure 08/25/2010   COMPUTERIZED TOMOGRAPHY, CHEST, ABNORMAL 08/25/2010   OSA (obstructive sleep apnea) 04/24/2009   Allergic rhinitis 06/11/2008   Dyspnea 06/11/2008   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   Chronic diastolic CHF (congestive heart failure) (HCC) 06/08/2008   INTERSTITIAL CYSTITIS 06/08/2008    PCP: [facility]  REFERRING PROVIDER: Beuford Anes  REFERRING DIAG: s/p Lumbar fusion, Lt trochanteric bursitis  Rationale for Evaluation and Treatment: Rehabilitation  THERAPY DIAG:  No diagnosis found.  ONSET DATE: 04/20/24  SUBJECTIVE:                                                                                                                                                                                           SUBJECTIVE STATEMENT:    EVAL: Pt is s/p L4/5 fusion on 04/20/24. She had been wearing back brace until 2 weeks ago and is now cleared for all activity. She states she has the most difficulty with bending and  with  walking longer than 20-30 minutes. She states she has pain in her mid back and Lt lower back. Pt has been using tylenol  and lidocaine  patches for pain control. Pt also has Lt trochanteric bursitis and had an injection 07/14/24.  PERTINENT HISTORY:  DDD, osteoporosis, vertigo  PAIN:  Are you having pain? Yes: NPRS scale: 6/10 Pain location: Lt low back Pain description: sore, ache Aggravating factors: walking, bending Relieving factors: meds, pain patch  PRECAUTIONS: Fall and Other: per pt MD recommends no heavy lifting, osteoporosis  RED FLAGS: None   WEIGHT BEARING RESTRICTIONS: No  FALLS:  Has patient fallen in last 6 months? No   OCCUPATION: enjoys home projects, community activities  PLOF: Independent  PATIENT GOALS: decrease pain  NEXT MD VISIT: December 2025  OBJECTIVE:  Note: Objective measures were completed at Evaluation unless otherwise noted.  DIAGNOSTIC FINDINGS:  L4/5 fusion  PATIENT SURVEYS:  Modified Oswestry: 17/50   COGNITION: Overall cognitive status: Within functional limits for tasks assessed       POSTURE:  Low tone PALPATION: Mild increase in mm spasticity bilat lumbar paraspinals  LUMBAR ROM:   AROM eval  Flexion 70%  Extension To neutral  - pain  Right lateral flexion WFL - pain end range  Left lateral flexion WFL  Right rotation 50% - pain Lt side  Left rotation 70%   (Blank rows = not tested)  LOWER EXTREMITY ROM:    Grossly WFL bilat hips   LOWER EXTREMITY MMT:    MMT Right eval Left eval  Hip flexion 3- 3  Hip extension 3- 3  Hip abduction 3- 3  Hip adduction    Hip internal rotation    Hip external rotation 3- 3  Knee flexion    Knee extension    Ankle dorsiflexion    Ankle plantarflexion    Ankle inversion    Ankle eversion     (Blank rows = not tested)  LUMBAR SPECIAL TESTS:  Straight leg raise test: Negative    GAIT: Distance walked: 100' Assistive device utilized: None Level of assistance:  Complete Independence Comments: wide BOS  TREATMENT DATE: 07/26/24 See HEP Pt educated on PT POC and goals, HEP, rationale for treatment                                                                                                                                 PATIENT EDUCATION:  Education details: See treatment  Person educated: Patient Education method: Explanation and Demonstration Education comprehension: verbalized understanding and returned demonstration  HOME EXERCISE PROGRAM: Access Code: [email or web address] URL: [email or web address] Date: 07/26/2024 Prepared by: Darice Conine  Exercises - Supine March  - 1 x daily - 7 x weekly - 2 sets - 10 reps - Supine Bridge  - 1 x daily - 7 x weekly - 2 sets - 10 reps - Hooklying Clamshell with Resistance  - 1 x daily - 7 x weekly -  2 sets - 10 reps  ASSESSMENT:  CLINICAL IMPRESSION:    EVAL: Patient is a 73 y.o. female who was seen today for physical therapy evaluation and treatment for s/p lumbar fusion and Lt trochanteric bursitis. Pt presents with significant deficits in hip and core strength, impaired gait, balance and decreased functional activity tolerance. Pt will benefit from skilled PT to address deficits and improve functional mobility.   OBJECTIVE IMPAIRMENTS: decreased activity tolerance, decreased ROM, decreased strength, postural dysfunction, and pain.   ACTIVITY LIMITATIONS: lifting, bending, standing, and locomotion level  PARTICIPATION LIMITATIONS: shopping, community activity, and yard work  PERSONAL FACTORS: Past/current experiences, Time since onset of injury/illness/exacerbation, and 1-2 comorbidities: bursitis, neuromuscular disorder are also affecting patient's functional outcome.   REHAB POTENTIAL: Good  CLINICAL DECISION MAKING: Evolving/moderate complexity  EVALUATION COMPLEXITY: Moderate   GOALS: Goals reviewed with patient? Yes  SHORT TERM GOALS: Target date: 08/23/2024    Pt  will be independent in initial HEP Baseline: Goal status: INITIAL  2.  Pt will improve LE strength to 4/5 to improve standing and walking tolerance Baseline:  Goal status: INITIAL    LONG TERM GOALS: Target date: 09/20/2024    Pt will be independent with advanced HEP Baseline:  Goal status: INITIAL  2.  Pt will improve modified oswestry to <= 7/50 to demo improved functional mobility Baseline:  Goal status: INITIAL  3.  Pt will tolerate walking x 30 minutes with pain <= 2/10 to complete shopping tasks Baseline:  Goal status: INITIAL  4.  Pt will be able to bend to pick up small item dropped on floor with pain <= 2/10 Baseline:  Goal status: INITIAL   PLAN:  PT FREQUENCY: 1-2x/week  PT DURATION: 8 weeks  PLANNED INTERVENTIONS: 97164- PT Re-evaluation, 97110-Therapeutic exercises, 97530- Therapeutic activity, 97112- Neuromuscular re-education, 97535- Self Care, [id number]- Manual therapy, [id number]- Electrical stimulation (unattended), 20560 (1-2 muscles), 20561 (3+ muscles)- Dry Needling, Patient/Family education, Balance training, Taping, Cryotherapy, and Moist heat.  PLAN FOR NEXT SESSION: Assess response to HEP, hip and core strength

## 2024-08-02 ENCOUNTER — Ambulatory Visit: Admitting: Physical Therapy

## 2024-08-02 LAB — C-REACTIVE PROTEIN: CRP: 3.1 mg/L (ref <8.0)

## 2024-08-02 LAB — SEDIMENTATION RATE: Sed Rate: 11 mm/h (ref 0–30)

## 2024-08-03 ENCOUNTER — Ambulatory Visit: Payer: Medicare Other | Admitting: Internal Medicine

## 2024-08-03 ENCOUNTER — Other Ambulatory Visit

## 2024-08-03 ENCOUNTER — Encounter: Payer: Self-pay | Admitting: Internal Medicine

## 2024-08-03 VITALS — BP 112/74 | HR 68 | Ht 66.0 in | Wt 164.0 lb

## 2024-08-03 DIAGNOSIS — M810 Age-related osteoporosis without current pathological fracture: Secondary | ICD-10-CM | POA: Diagnosis not present

## 2024-08-03 DIAGNOSIS — R450 Nervousness: Secondary | ICD-10-CM | POA: Diagnosis not present

## 2024-08-03 NOTE — Patient Instructions (Signed)
 Calcium  citrate 1200 mg daily  Vitamin D3 2000 international unit  daily

## 2024-08-03 NOTE — Progress Notes (Unsigned)
 Name: Mia [name]  MRN/ DOB: [id number], 13-Apr-1951    Age/ Sex: 73 y.o., female     PCP: Alvan Dorothyann BIRCH, MD   Reason for Endocrinology Evaluation: Hyponatremia      Initial Endocrinology Clinic Visit: 05/21/2020    PATIENT IDENTIFIER: Mia [name] is a 73 y.o., female with a past medical history of emphysema, OSA on CPAP , SVT and Raynaud's phenomenon, RA ,and memory loss . She has followed with [facility] since 05/21/2020 for consultative assistance with management of her Hyponatremia       HISTORICAL SUMMARY:  Pt has been noted with hyponatremia in 02/2020 with a nadir of 129 mmol/L.  She was consuming ~ 60-80 oz of crushed ice as well as ~ 40 oz of fluid a day. She was on HCTZ at the time, which we stopped.    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   She was drinking ~160 oz of fluids a day in addition to being on HCTZ. Sodium normalized with fluid reduction and stopping HCTZ.   Patient returned to endocrinology clinic for consultation osteoporosis in 12/15/2021   OSTEOPOROSIS HISTORY:  Pt was diagnosed with osteoporosis:2020  Menarche at age : 63 Menopausal at age : Hysterectomy at age 66 due to bleeding . Oophorectomy at age 47 Fracture Hx: Probable right third metatarsal stress fracture 10/2020 (status post fall )left foot jones fracture  Hx of HRT: Yes, for years  FH of osteoporosis or hip fracture:  sister  Prior Hx of anti-estrogenic therapy :no Prior Hx of anti-resorptive therapy : Alendronate  in 2016 switched to  Prolia  in 08/2017 until 11/2021    Received 1st dose of zoledronic  acid 01/06/2022   24-hr urinary calcium  56 mg 11/2021  SUBJECTIVE:    Today (08/03/2024):  Ms. Rosch is here for a follow up on osteoporosis.  Patient has been noted with weight loss over the past year She does feel up with cardiology for labile blood pressure and SVT  Since her last visit here ,  she had back fusion which has improved her pain  She is undergoing PT She continues with falls and unsteady gait  Continues with vertigo  Denies bone fractures  She still have left knee pain , she would postpone sx No heartburn , on PPI  No constipation or diarrhea  She has been feeling jittery and worsening essential tremors   She is not on MVI, calcium  nor vitamin D        HISTORY:  Past Medical History:  Past Medical History:  Diagnosis Date   Anal fissure    Anemia    Arthritis    Blood transfusion    C. difficile colitis    Chest pain    a. 01/2013 MV: EF 59%, no ischemia.   Chronic Dyspnea    a. 01/2013 Echo: EF 60-65%, Gr 1 DD, PASP 24mmHg.// Echo 01/2020: EF 60-65, no RWMA, GR 1 DD, GLS -21.6%, normal RV SF, trivial MR    COVID-19    DDD (degenerative disc disease), cervical    DDD (degenerative disc disease), lumbar    DVT (deep venous thrombosis) (HCC) 06/23/2023   left lower extremity   Fibromyalgia    Gall stones    GERD (gastroesophageal reflux disease)    gastritis   Hemorrhoids    Hypertension    Interstitial cystitis    MCI (mild cognitive impairment) 11/25/2017   Memory difficulty  12/14/2013   Neuromuscular disorder (HCC)    sclerosis   Osteoporosis    Peptic ulcer    PONV (postoperative nausea and vomiting)    Pseudogout    Raynaud phenomenon    Rectal bleeding    Sleep apnea    a. on cpap.   Stress fracture of left tibia 11/05/2016   SVT (supraventricular tachycardia)    Syncope 09/10/2015   Thyroid  disease    hypothyroidism   Tremor, essential 05/14/2016   Past Surgical History:  Past Surgical History:  Procedure Laterality Date   APPENDECTOMY     BLADDER SURGERY     x2   BLADDER SURGERY     BREAST BIOPSY     CARDIAC CATHETERIZATION     CARPAL TUNNEL RELEASE     CATARACT EXTRACTION Bilateral    CHOLECYSTECTOMY     DILATION AND CURETTAGE OF UTERUS     ENTEROCELE REPAIR     x2   EYE SURGERY     retina   herniated disc   11/25/2021   L4 and L5   hysterectomy - unknown type     JOINT REPLACEMENT     KNEE ARTHROPLASTY     KNEE SURGERY     x6   NECK SURGERY     fusion   OOPHORECTOMY     QUADRICEPS REPAIR Right    RECTOCELE REPAIR     x2   SHOULDER ARTHROSCOPY WITH SUBACROMIAL DECOMPRESSION, ROTATOR CUFF REPAIR AND BICEP TENDON REPAIR  10/06/2012   Procedure: SHOULDER ARTHROSCOPY WITH SUBACROMIAL DECOMPRESSION, ROTATOR CUFF REPAIR AND BICEP TENDON REPAIR;  Surgeon: Eva Elsie Herring, MD;  Location: [facility];  Service: Orthopedics;  Laterality: Right;  Arthroscopic  Repair  of  Subscapularis, Open Biceps Tenodesis   SHOULDER SURGERY     bilateral- bones spur   SHOULDER SURGERY Left 04/15/2021   TONSILLECTOMY     TOTAL KNEE ARTHROPLASTY     TOTAL SHOULDER ARTHROPLASTY     TRANSFORAMINAL LUMBAR INTERBODY FUSION (TLIF) WITH PEDICLE SCREW FIXATION 1 LEVEL Left 04/20/2024   Procedure: TRANSFORAMINAL LUMBAR INTERBODY FUSION (TLIF) WITH PEDICLE SCREW FIXATION 1 LEVEL;  Surgeon: Beuford Anes, MD;  Location: [facility];  Service: Orthopedics;  Laterality: Left;  LEFT-SIDED LUMBAR 4 - LUMBAR 5 TRANSFORAMINAL LUMBAR INTERBODY FUSION AND DECOMPRESSION WITH INSTRUMENTATION AND ALLOGRAFT   Social History:  reports that she has never smoked. She has been exposed to tobacco smoke. She has never used smokeless tobacco. She reports that she does not drink alcohol and does not use drugs. Family History:  Family History  Problem Relation Age of Onset   Heart attack Mother    Stroke Mother    Diabetes Mother    Heart disease Mother    Colon polyps Mother    Asthma Mother    Dementia Mother    Alzheimer's disease Mother    Heart disease Father    Heart attack Father    Asthma Father    Stroke Sister    Hypertension Sister    Rheum arthritis Sister    Dementia Sister    Asthma Sister    Parkinson's disease Sister    Lupus Sister    Asthma Sister    Breast cancer Maternal Grandmother    Wilson's  disease Maternal Grandmother    Pancreatic cancer Maternal Grandfather    Autoimmune disease Daughter        in eyes   Arthritis Daughter    Deep vein thrombosis Daughter  HOME MEDICATIONS: Allergies as of 08/03/2024       Reactions   Codeine Nausea And Vomiting   Other Reaction(s): UNKNOWN   Myrbetriq  [mirabegron] Other (See Comments)   SEVERE HEADACHE   Nystatin Other (See Comments)   Unknown reaction   Percocet [oxycodone-acetaminophen ] Nausea And Vomiting   Propoxyphene Nausea And Vomiting   Other Reaction(s): UNKNOWN   Acetaminophen  Other (See Comments)   Erythromycin Base    Other Reaction(s): UNKNOWN   Ketorolac     Other Reaction(s): UNKNOWN   Methadone    Oxycodone-acetaminophen  Nausea Only   Celecoxib Other (See Comments)   Headache/flushed   Darvocet [propoxyphene N-acetaminophen ] Nausea And Vomiting   Erythromycin Nausea And Vomiting   Nausea and vomiting   Hydrocodone  Nausea And Vomiting   Nitrofurantoin Nausea And Vomiting   Other Reaction(s): UNKNOWN   Pregabalin  Swelling   Legs swelling   Toradol  [ketorolac  Tromethamine ] Nausea And Vomiting   Per patient, only PO form causes nausea and vomiting. Can take injection without issue   Tramadol Nausea And Vomiting   Verapamil Nausea And Vomiting        Medication List        Accurate as of August 03, 2024 10:39 AM. If you have any questions, ask your nurse or doctor.          acetaminophen  500 MG tablet Commonly known as: TYLENOL  Take 1,000 mg by mouth every 6 (six) hours as needed for moderate pain (pain score 4-6) (pain.).   albuterol  108 (90 Base) MCG/ACT inhaler Commonly known as: VENTOLIN  HFA Inhale 1-2 puffs into the lungs every 6 (six) hours as needed for shortness of breath or wheezing.   AMBULATORY NON FORMULARY MEDICATION Medication Name: CPAP with humidifier set 14 cm H2o pressure.  Fullface mask. Need repeat download in 2 weeks to [phone]   AMBULATORY NON FORMULARY  MEDICATION Adjust CPAP - increase pressure from 12 to 14   amLODipine  5 MG tablet Commonly known as: NORVASC  Take 1 tablet (5 mg total) by mouth in the morning and at bedtime.   ammonium lactate  12 % lotion Commonly known as: Amlactin Daily Apply 1 Application topically as needed.   amoxicillin  500 MG capsule Commonly known as: AMOXIL  Take 2,000 mg by mouth as needed (BEFORE DENTAL PROCEDURES).   colestipol  1 g tablet Commonly known as: COLESTID  Take 1 g by mouth daily at 12 noon.   cyanocobalamin  1000 MCG/ML injection Commonly known as: VITAMIN B12 Inject 1,000 mcg into the muscle every 28 (twenty-eight) days.   donepezil  10 MG tablet Commonly known as: ARICEPT  TAKE 1 TABLET IN THE EVENING AT BEDTIME   ipratropium 0.06 % nasal spray Commonly known as: ATROVENT  Place 2 sprays into both nostrils 2 (two) times daily as needed for rhinitis.   leflunomide  20 MG tablet Commonly known as: ARAVA  Take by mouth.   leflunomide  10 MG tablet Commonly known as: ARAVA  Take 10 mg po every other day alternating with 20 mg po every other day   meclizine  25 MG tablet Commonly known as: ANTIVERT  Take 1 tablet (25 mg total) by mouth 3 (three) times daily as needed for dizziness.   Melatonin 10 MG Caps Take 10 mg by mouth at bedtime.   memantine  10 MG tablet Commonly known as: NAMENDA  TAKE 1 TABLET BY MOUTH TWO TIMES A DAY ***MUST SEE DR FOR FURTHER REFILLS***   metoprolol  tartrate 100 MG tablet Commonly known as: LOPRESSOR  Take 1 tablet (100 mg total) by mouth 2 (two) times daily.  pantoprazole  20 MG tablet Commonly known as: PROTONIX  Take 20 mg by mouth 2 (two) times daily.   predniSONE  20 MG tablet Commonly known as: DELTASONE  2 tabs po every day x 5d   primidone  50 MG tablet Commonly known as: MYSOLINE  Take 1 tablet (50 mg total) by mouth at bedtime.   rosuvastatin  5 MG tablet Commonly known as: CRESTOR  Take 1 tablet (5 mg total) by mouth daily.   sodium chloride   5 % ophthalmic solution Commonly known as: MURO 128 Place 1 drop into both eyes in the morning, at noon, in the evening, and at bedtime.   valsartan  160 MG tablet Commonly known as: DIOVAN  Take 1 tablet (160 mg total) by mouth 2 (two) times daily.   venlafaxine  XR 75 MG 24 hr capsule Commonly known as: EFFEXOR -XR TAKE 1 CAPSULE DAILY WITH BREAKFAST          OBJECTIVE:   PHYSICAL EXAM: VS: BP 112/74 (BP Location: Left Arm, Patient Position: Sitting, Cuff Size: Normal)   Pulse 68   Ht 5' 6 (1.676 m)   Wt 164 lb (74.4 kg)   SpO2 96%   BMI 26.47 kg/m    EXAM: General: Pt appears well and is in NAD  Lungs: Clear with good BS bilat   Heart: Auscultation: RRR.  Extremities:  BL LE: Trace pretibial on the left  Mental Status: Judgment, insight: Intact Orientation: Oriented to time, place, and person Mood and affect: No depression, anxiety, or agitation     DATA REVIEWED:   Latest Reference Range & Units 04/12/24 14:00  Sodium 135 - 145 mmol/L 138  Potassium 3.5 - 5.1 mmol/L 4.6  Chloride 98 - 111 mmol/L 101  CO2 22 - 32 mmol/L 29  Glucose 70 - 99 mg/dL 881 (H)  BUN 8 - 23 mg/dL 5 (L)  Creatinine 9.55 - 1.00 mg/dL 9.29  Calcium  8.9 - 10.3 mg/dL 9.8  Anion gap 5 - 15  8  GFR, Estimated >60 mL/min >60  (H): Data is abnormally high (L): Data is abnormally low   DXA 08/25/2023 @ [facility]   ASSESSMENT: The BMD measured at Femur Neck Right is 0.744 g/cm2 with a T-score of -2.1. This patient's diagnostic category is LOW BONE MASS/OSTEOPENIA according to World Health Organization [facility]) criteria.L- 2-3 was excluded due to degenerative changes. The patient will not get a frax because she is on BBM. The scan quality is good.   Site Region Measured Date Measured Age WHO YA BMD Classification T-score AP Spine L1-L4 (L2,L3) 08/25/2023 71.9 Normal 0.2 1.190 g/cm2   DualFemur Total Mean 8/25/2023 71.9 [age] Low Bone Mass -1.7 0.799 g/cm2   Left  Forearm Radius 33% 08/25/2023 71.9 Normal -0.3 0.848 g/cm2       06/07/2019 06/20/2021 Change from prior  RFN NA -2.6   Right total hip -1.3 -1.10 Up 4%  LFN NA -1.6   Left total hip -1.4 -0.9 Up to 8%  AP spine +0.00 +1.1 Up 11%    ASSESSMENT / PLAN / RECOMMENDATIONS:   Osteoporosis    -Patient continues to be on a low calcium  diet, I have encouraged patient again to start calcium  citrate due to better absorption profile --In review of her chart the patient has been on alendronate  in 2016, she believes she started having jaw pains and also because it was once weekly dosing she was forgetting it  -She was on Prolia  from 2018 until 11/2021 we opted to start drug holiday -She received her first zoledronic   acid injection 01/06/2022 without any side effects   2.  Vitamin D  insufficiency:  -Patient to start OTC vitamin D  3 2000 IU daily       F/U 1 year    Signed electronically by: Stefano Redgie Butts, MD  [facility]  [facility] [address] [place], [place] [address] Phone: [phone] FAX: [phone]      CC: Alvan Dorothyann BIRCH, MD [address] [facility] [place] [address] [place] [address] Phone: [phone]  Fax: [phone]   Return to Endocrinology clinic as below: Future Appointments  Date Time Provider Department Center  08/09/2024 10:00 AM [facility] CLINICAL SU[facility] [place]  08/09/2024 11:00 AM Reena Darice POUR, PT [facility]  08/16/2024 11:00 AM Reena, Darice POUR, PT [facility]  08/23/2024 11:00 AM Reena, Darice POUR, PT [facility]  10/04/2024 10:15 AM Loreda Hacker, DPM [facility]  10/16/2024  9:30 AM Alvan Dorothyann BIRCH, MD [facility] [place]  11/01/2024 10:10 AM Cheryl Waddell HERO, PA-C [facility] None  02/07/2025 10:30 AM Sherryl Bouchard, NP [facility] None  05/31/2025  9:00 AM [facility]-ANNUAL WELLNESS VISIT [facility] [place]

## 2024-08-04 ENCOUNTER — Ambulatory Visit: Payer: Self-pay | Admitting: Internal Medicine

## 2024-08-04 LAB — T4, FREE: Free T4: 1.2 ng/dL (ref 0.8–1.8)

## 2024-08-04 LAB — TSH: TSH: 1.64 m[IU]/L (ref 0.40–4.50)

## 2024-08-07 DIAGNOSIS — D235 Other benign neoplasm of skin of trunk: Secondary | ICD-10-CM | POA: Diagnosis not present

## 2024-08-07 DIAGNOSIS — D225 Melanocytic nevi of trunk: Secondary | ICD-10-CM | POA: Diagnosis not present

## 2024-08-07 DIAGNOSIS — L814 Other melanin hyperpigmentation: Secondary | ICD-10-CM | POA: Diagnosis not present

## 2024-08-07 DIAGNOSIS — L579 Skin changes due to chronic exposure to nonionizing radiation, unspecified: Secondary | ICD-10-CM | POA: Diagnosis not present

## 2024-08-07 DIAGNOSIS — Z85828 Personal history of other malignant neoplasm of skin: Secondary | ICD-10-CM | POA: Diagnosis not present

## 2024-08-07 DIAGNOSIS — L821 Other seborrheic keratosis: Secondary | ICD-10-CM | POA: Diagnosis not present

## 2024-08-08 ENCOUNTER — Inpatient Hospital Stay: Admit: 2024-08-08 | Payer: MEDICARE | Primary: Internal Medicine

## 2024-08-08 ENCOUNTER — Encounter: Admit: 2024-08-08 | Discharge: 2024-08-08 | Payer: BLUE CROSS/BLUE SHIELD | Primary: Internal Medicine

## 2024-08-08 DIAGNOSIS — E119 Type 2 diabetes mellitus without complications: Principal | ICD-10-CM

## 2024-08-08 LAB — CBC WITH AUTO DIFFERENTIAL
Basophils %: 0.1 % (ref 0.0–2.0)
Basophils Absolute: 0.01 K/UL (ref 0.00–0.10)
Eosinophils %: 1.4 % (ref 0.0–5.0)
Hematocrit: 40.3 % (ref 35.0–45.0)
Hemoglobin: 12.5 g/dL (ref 12.0–16.0)
Immature Granulocytes %: 0.4 % (ref 0.0–0.5)
Lymphocytes Absolute: 1.13 K/UL (ref 0.90–3.60)
MCH: 30.1 pg (ref 24.0–34.0)
MCHC: 31 g/dL (ref 31.0–37.0)
Monocytes %: 7.3 % (ref 3.0–10.0)
Neutrophils Absolute: 6.21 K/UL (ref 1.80–8.00)
RDW: 13.5 % (ref 11.6–14.5)
WBC: 8.1 K/uL (ref 4.6–13.2)

## 2024-08-08 LAB — LIPID PANEL
HDL: 43 mg/dL (ref 40–60)
LDL Cholesterol: 64 mg/dL (ref 0–100)
Triglycerides: 139 mg/dL (ref 0–150)
VLDL Cholesterol Calculated: 28 mg/dL

## 2024-08-08 LAB — COMPREHENSIVE METABOLIC PANEL
AST: 21 U/L (ref 10–38)
Alk Phosphatase: 87 U/L (ref 45–117)
Anion Gap: 11 mmol/L (ref 3.0–18.0)
BUN/Creatinine Ratio: 14 (ref 12–20)
BUN: 12 mg/dL (ref 6–23)
Calcium: 8.9 mg/dL (ref 8.6–10.2)
Chloride: 101 mmol/L (ref 98–107)
Creatinine: 0.86 mg/dL (ref 0.6–1.3)
Globulin: 3.5 g/dL (ref 2.0–4.0)
Glucose: 106 mg/dL (ref 74–108)
Potassium: 4.3 mmol/L (ref 3.5–5.5)
Sodium: 139 mmol/L (ref 136–145)
Total Protein: 6.9 g/dL (ref 6.4–8.2)

## 2024-08-08 LAB — HEMOGLOBIN A1C W/O EAG: Hemoglobin A1C: 6 % — ABNORMAL HIGH (ref 4.2–5.6)

## 2024-08-08 NOTE — Therapy (Unsigned)
 OUTPATIENT PHYSICAL THERAPY THORACOLUMBAR TREATMENT    Patient Name: Mia [name] MRN: [id number] DOB:1951/06/03, 73 y.o., female Today's Date: 08/09/2024  END OF SESSION:  PT End of Session - 08/09/24 1103     Visit Number 2    Number of Visits 8    Date for Recertification  09/20/24    Authorization Type medicare    Authorization - Visit Number 6    Progress Note Due on Visit 10    PT Start Time 1103    PT Stop Time 1145    PT Time Calculation (min) 42 min    Activity Tolerance Patient tolerated treatment well    Behavior During Therapy [facility] for tasks assessed/performed           Past Medical History:  Diagnosis Date   Anal fissure    Anemia    Arthritis    Blood transfusion    C. difficile colitis    Chest pain    a. 01/2013 MV: EF 59%, no ischemia.   Chronic Dyspnea    a. 01/2013 Echo: EF 60-65%, Gr 1 DD, PASP 57mmHg.// Echo 01/2020: EF 60-65, no RWMA, GR 1 DD, GLS -21.6%, normal RV SF, trivial MR    COVID-19    DDD (degenerative disc disease), cervical    DDD (degenerative disc disease), lumbar    DVT (deep venous thrombosis) (HCC) 06/23/2023   left lower extremity   Fibromyalgia    Gall stones    GERD (gastroesophageal reflux disease)    gastritis   Hemorrhoids    Hypertension    Interstitial cystitis    MCI (mild cognitive impairment) 11/25/2017   Memory difficulty 12/14/2013   Neuromuscular disorder (HCC)    sclerosis   Osteoporosis    Peptic ulcer    PONV (postoperative nausea and vomiting)    Pseudogout    Raynaud phenomenon    Rectal bleeding    Sleep apnea    a. on cpap.   Stress fracture of left tibia 11/05/2016   SVT (supraventricular tachycardia)    Syncope 09/10/2015   Thyroid  disease    hypothyroidism   Tremor, essential 05/14/2016   Past Surgical History:  Procedure Laterality Date   APPENDECTOMY     BLADDER SURGERY     x2   BLADDER SURGERY     BREAST BIOPSY     CARDIAC CATHETERIZATION     CARPAL TUNNEL RELEASE     CATARACT  EXTRACTION Bilateral    CHOLECYSTECTOMY     DILATION AND CURETTAGE OF UTERUS     ENTEROCELE REPAIR     x2   EYE SURGERY     retina   herniated disc  11/25/2021   L4 and L5   hysterectomy - unknown type     JOINT REPLACEMENT     KNEE ARTHROPLASTY     KNEE SURGERY     x6   NECK SURGERY     fusion   OOPHORECTOMY     QUADRICEPS REPAIR Right    RECTOCELE REPAIR     x2   SHOULDER ARTHROSCOPY WITH SUBACROMIAL DECOMPRESSION, ROTATOR CUFF REPAIR AND BICEP TENDON REPAIR  10/06/2012   Procedure: SHOULDER ARTHROSCOPY WITH SUBACROMIAL DECOMPRESSION, ROTATOR CUFF REPAIR AND BICEP TENDON REPAIR;  Surgeon: Eva Elsie Herring, MD;  Location: [facility];  Service: Orthopedics;  Laterality: Right;  Arthroscopic  Repair  of  Subscapularis, Open Biceps Tenodesis   SHOULDER SURGERY     bilateral- bones spur   SHOULDER SURGERY Left 04/15/2021  TONSILLECTOMY     TOTAL KNEE ARTHROPLASTY     TOTAL SHOULDER ARTHROPLASTY     TRANSFORAMINAL LUMBAR INTERBODY FUSION (TLIF) WITH PEDICLE SCREW FIXATION 1 LEVEL Left 04/20/2024   Procedure: TRANSFORAMINAL LUMBAR INTERBODY FUSION (TLIF) WITH PEDICLE SCREW FIXATION 1 LEVEL;  Surgeon: Beuford Anes, MD;  Location: [facility] OR;  Service: Orthopedics;  Laterality: Left;  LEFT-SIDED LUMBAR 4 - LUMBAR 5 TRANSFORAMINAL LUMBAR INTERBODY FUSION AND DECOMPRESSION WITH INSTRUMENTATION AND ALLOGRAFT   Patient Active Problem List   Diagnosis Date Noted   Radiculopathy 04/20/2024   History of DVT of lower extremity 08/23/2023   Pain of right sacroiliac joint 10/15/2022   Palpitations 06/02/2022   Pernicious anemia 09/11/2020   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   Weakness of both lower extremities 06/11/2020   Hyponatremia 05/22/2020   Gait instability 06/20/2019   Facet arthritis of lumbar region 05/10/2018   MCI (mild cognitive impairment) 11/25/2017   Restrictive lung disease 07/30/2017   Mixed hyperlipidemia 06/18/2017   Rectocele 04/26/2017   Irritable  bowel syndrome with both constipation and diarrhea 04/26/2017   Osteoporosis 04/18/2017   Trochanteric bursitis of both hips 02/24/2017   Inflammatory arthritis 09/30/2016   High risk medication use 09/30/2016   History of Clostridium difficile colitis 09/30/2016   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   Pseudogout 09/30/2016   DDD cervical spine status post fusion 09/30/2016   DDD thoracic spine 09/30/2016   H/O total knee replacement, right 09/30/2016   Age-related osteoporosis without current pathological fracture 09/30/2016   Tremor, essential 05/14/2016   Right foot pain 04/14/2016   B12 deficiency 09/10/2015   Cervical facet joint syndrome 09/10/2015   Chronic fatigue 09/10/2015   Aortic atherosclerosis 04/01/2015   DOE (dyspnea on exertion)    Primary osteoarthritis of right knee 08/13/2014   Benign head tremor 06/11/2014   Lumbar degenerative disc disease 06/11/2014   IFG (impaired fasting glucose) 03/09/2014   Hemorrhoid 03/09/2014   Retinal wrinkling, right eye 03/09/2014   Fibromyalgia 03/09/2014   GERD (gastroesophageal reflux disease) 03/09/2014   History of arthroplasty of right knee 01/31/2014   Hemorrhoids, external, thrombosed 06/22/2011   Hypertension 03/11/2011   SVT (supraventricular tachycardia)    Raynaud phenomenon    Nonspecific (abnormal) findings on radiological and other examination of body structure 08/25/2010   COMPUTERIZED TOMOGRAPHY, CHEST, ABNORMAL 08/25/2010   OSA (obstructive sleep apnea) 04/24/2009   Allergic rhinitis 06/11/2008   Dyspnea 06/11/2008   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   Chronic diastolic CHF (congestive heart failure) (HCC) 06/08/2008   INTERSTITIAL CYSTITIS 06/08/2008    PCP: [facility]  REFERRING PROVIDER: Beuford Anes  REFERRING DIAG: s/p Lumbar fusion, Lt trochanteric bursitis  Rationale for Evaluation and Treatment: Rehabilitation  THERAPY DIAG:  Muscle weakness (generalized)  Other  low back pain  ONSET DATE: 04/20/24  SUBJECTIVE:  SUBJECTIVE STATEMENT:  Pt reports she is currently taking a drug that's helping her with vertigo, and also helping with allergies. She states she is feeling better since surgery.   EVAL: Pt is s/p L4/5 fusion on 04/20/24. She had been wearing back brace until 2 weeks ago and is now cleared for all activity. She states she has the most difficulty with bending and with walking longer than 20-30 minutes. She states she has pain in her mid back and Lt lower back. Pt has been using tylenol  and lidocaine  patches for pain control. Pt also has Lt trochanteric bursitis and had an injection 07/14/24.  PERTINENT HISTORY:  DDD, osteoporosis, vertigo  PAIN:  Are you having pain? Yes: NPRS scale: 1/10 Pain location: Lt hip  Pain description: sore, ache Aggravating factors: walking, bending Relieving factors: meds, pain patch  PRECAUTIONS: Fall and Other: per pt MD recommends no heavy lifting, osteoporosis  RED FLAGS: None   WEIGHT BEARING RESTRICTIONS: No  FALLS:  Has patient fallen in last 6 months? No   OCCUPATION: enjoys home projects, community activities  PLOF: Independent  PATIENT GOALS: decrease pain  NEXT MD VISIT: December 2025  OBJECTIVE:  Note: Objective measures were completed at Evaluation unless otherwise noted.  DIAGNOSTIC FINDINGS:  L4/5 fusion  PATIENT SURVEYS:  Modified Oswestry: 17/50   COGNITION: Overall cognitive status: Within functional limits for tasks assessed       POSTURE:  Low tone PALPATION: Mild increase in mm spasticity bilat lumbar paraspinals  LUMBAR ROM:   AROM eval  Flexion 70%  Extension To neutral  - pain  Right lateral flexion WFL - pain end range  Left lateral flexion WFL  Right rotation 50% -  pain Lt side  Left rotation 70%   (Blank rows = not tested)  LOWER EXTREMITY ROM:    Grossly WFL bilat hips   LOWER EXTREMITY MMT:    MMT Right eval Left eval    Hip flexion 3- 3    Hip extension 3- 3    Hip abduction 3- 3    Hip adduction      Hip internal rotation      Hip external rotation 3- 3    Knee flexion      Knee extension      Ankle dorsiflexion      Ankle plantarflexion      Ankle inversion      Ankle eversion       (Blank rows = not tested)  LUMBAR SPECIAL TESTS:  Straight leg raise test: Negative    GAIT: Distance walked: 100' Assistive device utilized: None Level of assistance: Complete Independence Comments: wide BOS  TREATMENT DATE:   [facility] Adult PT Treatment:                                                DATE: 08/09/24 Therapeutic Exercise: Nustep level 4 for 5 min- SPT present to discuss pt status Hook lying clamshells with green band 2x12 Low trunk rotation x20  Neuromuscular re-ed: Glute bridges 2x10  Supine adductor squeeze with ball between knees 2x10 - verbal cues for pt to engage core while contracting LE Therapeutic Activity: Standing hip abduction with 2 lb ankle weights - verbal and tactile cues to keep hips neutral  LAQ using 2 lb ankle weights x5 each leg Ambulated half lap around ortho gym with  2 lb ankle weights Self Care: Patient education on dry needling and ultrasound treatment   07/26/24 See HEP Pt educated on PT POC and goals, HEP, rationale for treatment                                                                                                                                 PATIENT EDUCATION:  Education details: See treatment  Person educated: Patient Education method: Explanation and Demonstration Education comprehension: verbalized understanding and returned demonstration  HOME EXERCISE PROGRAM: Access Code: [email or web address] URL: [email or web address] Date: 08/09/2024 Prepared by: Darice Conine  Exercises - Supine March  - 1 x daily - 7 x weekly - 2 sets - 10 reps - Supine Bridge  - 1 x daily - 7 x weekly - 2 sets - 10 reps - Hooklying Clamshell with Resistance  - 1 x daily - 7 x weekly - 2 sets - 10 reps - Supine Hip Adduction Isometric with Ball  - 1 x daily - 7 x weekly - 2 sets - 10 reps - Standing Hip Abduction with Counter Support  - 1 x daily - 7 x weekly - 2 sets - 10 reps - Supine Lower Trunk Rotation  - 1 x daily - 7 x weekly - 2 sets - 10 reps   ASSESSMENT:  CLINICAL IMPRESSION: Ms Smith presents to skilled therapy with signicantly improved functional capacity,less pain, able to attend all community activities without symptoms. Pt admitted she has not been performing her HEP consistently. Today reviewed and updated HEP today. Overall pt tolerated exercises very well today, only noting mild left knee pain at end of session she states she plans to have knee surgery on eventually. Pt needed verbal and tactile cues to maintain neutral hips during standing abduction.   EVAL: Patient is a 73 y.o. female who was seen today for physical therapy evaluation and treatment for s/p lumbar fusion and Lt trochanteric bursitis. Pt presents with significant deficits in hip and core strength, impaired gait, balance and decreased functional activity tolerance. Pt will benefit from skilled PT to address deficits and improve functional mobility.   OBJECTIVE IMPAIRMENTS: decreased activity tolerance, decreased ROM, decreased strength, postural dysfunction, and pain.   ACTIVITY LIMITATIONS: lifting, bending, standing, and locomotion level  PARTICIPATION LIMITATIONS: shopping, community activity, and yard work  PERSONAL FACTORS: Past/current experiences, Time since onset of injury/illness/exacerbation, and 1-2 comorbidities: bursitis, neuromuscular disorder are also affecting patient's functional outcome.   REHAB POTENTIAL: Good  CLINICAL DECISION MAKING: Evolving/moderate complexity  EVALUATION  COMPLEXITY: Moderate   GOALS: Goals reviewed with patient? Yes  SHORT TERM GOALS: Target date: 08/23/2024    Pt will be independent in initial HEP Baseline: Goal status: INITIAL  2.  Pt will improve LE strength to 4/5 to improve standing and walking tolerance Baseline:  Goal status: INITIAL    LONG TERM GOALS: Target date: 09/20/2024    Pt will be independent with advanced HEP Baseline:  Goal status: INITIAL  2.  Pt will improve modified oswestry to <= 7/50 to demo improved functional mobility Baseline:  Goal status: INITIAL  3.  Pt will tolerate walking x 30 minutes with pain <= 2/10 to complete shopping tasks Baseline:  Goal status: INITIAL  4.  Pt will be able to bend to pick up small item dropped on floor with pain <= 2/10 Baseline:  Goal status: INITIAL   PLAN:  PT FREQUENCY: 1-2x/week  PT DURATION: 8 weeks  PLANNED INTERVENTIONS: 97164- PT Re-evaluation, 97110-Therapeutic exercises, 97530- Therapeutic activity, 97112- Neuromuscular re-education, 97535- Self Care, [id number]- Manual therapy, [id number]- Electrical stimulation (unattended), 20560 (1-2 muscles), 20561 (3+ muscles)- Dry Needling, Patient/Family education, Balance training, Taping, Cryotherapy, and Moist heat.  PLAN FOR NEXT SESSION: Assess response to HEP, hip and core strength, check short term goals next visit.   Lavanda Cleverly, SPT 08/09/24 3:31 PM

## 2024-08-09 ENCOUNTER — Ambulatory Visit: Admitting: Physical Therapy

## 2024-08-09 ENCOUNTER — Encounter: Payer: Self-pay | Admitting: Physical Therapy

## 2024-08-09 ENCOUNTER — Ambulatory Visit (INDEPENDENT_AMBULATORY_CARE_PROVIDER_SITE_OTHER)

## 2024-08-09 VITALS — BP 114/69 | HR 79 | Temp 98.0°F | Resp 18 | Ht 66.0 in

## 2024-08-09 DIAGNOSIS — E538 Deficiency of other specified B group vitamins: Secondary | ICD-10-CM

## 2024-08-09 DIAGNOSIS — M5459 Other low back pain: Secondary | ICD-10-CM | POA: Diagnosis not present

## 2024-08-09 DIAGNOSIS — Z23 Encounter for immunization: Secondary | ICD-10-CM | POA: Diagnosis not present

## 2024-08-09 DIAGNOSIS — R42 Dizziness and giddiness: Secondary | ICD-10-CM | POA: Diagnosis not present

## 2024-08-09 DIAGNOSIS — M6281 Muscle weakness (generalized): Secondary | ICD-10-CM | POA: Diagnosis not present

## 2024-08-09 MED ORDER — CYANOCOBALAMIN 1000 MCG/ML IJ SOLN
1000.0000 ug | INTRAMUSCULAR | Status: AC
  Administered 2024-08-09 – 2024-11-22 (×4): 1000 ug via INTRAMUSCULAR

## 2024-08-09 NOTE — Progress Notes (Signed)
 Patient is in office today for her B12 injection and her HD flu vaccine. Denies muscle cramps, fever, chills palpitations, or medication changes.   Pt given her B12 injection given in her LUOQ, and her flu in her LD. Tolerated both well no redness or swelling noted at the site.  Pt advised to RTC in 30 for next B12 injection.

## 2024-08-16 ENCOUNTER — Ambulatory Visit: Admitting: Physical Therapy

## 2024-08-16 ENCOUNTER — Encounter: Payer: Self-pay | Admitting: Physical Therapy

## 2024-08-16 ENCOUNTER — Ambulatory Visit: Admit: 2024-08-16 | Discharge: 2024-08-16 | Payer: MEDICARE | Attending: Internal Medicine | Primary: Internal Medicine

## 2024-08-16 VITALS — BP 120/73 | HR 86 | Temp 98.30000°F | Resp 16 | Ht 64.0 in | Wt 161.0 lb

## 2024-08-16 DIAGNOSIS — J309 Allergic rhinitis, unspecified: Principal | ICD-10-CM

## 2024-08-16 DIAGNOSIS — M5459 Other low back pain: Secondary | ICD-10-CM

## 2024-08-16 DIAGNOSIS — R42 Dizziness and giddiness: Secondary | ICD-10-CM

## 2024-08-16 DIAGNOSIS — M6281 Muscle weakness (generalized): Secondary | ICD-10-CM

## 2024-08-16 MED ORDER — PREDNISONE 20 MG PO TABS
20 | ORAL_TABLET | Freq: Every day | ORAL | 0 refills | Status: AC
Start: 2024-08-16 — End: 2024-08-21

## 2024-08-16 NOTE — Therapy (Signed)
 OUTPATIENT PHYSICAL THERAPY THORACOLUMBAR TREATMENT    Patient Name: Mia [name] MRN: [id number] DOB:1951/06/16, 73 y.o., female Today's Date: 08/16/2024  END OF SESSION:  PT End of Session - 08/16/24 1107     Visit Number 3    Number of Visits 8    Date for Recertification  09/20/24    Authorization Type medicare    Authorization - Visit Number 6    Progress Note Due on Visit 10    PT Start Time 1105    PT Stop Time 1145    PT Time Calculation (min) 40 min    Activity Tolerance Patient tolerated treatment well    Behavior During Therapy [facility] for tasks assessed/performed            Past Medical History:  Diagnosis Date   Anal fissure    Anemia    Arthritis    Blood transfusion    C. difficile colitis    Chest pain    a. 01/2013 MV: EF 59%, no ischemia.   Chronic Dyspnea    a. 01/2013 Echo: EF 60-65%, Gr 1 DD, PASP 63mmHg.// Echo 01/2020: EF 60-65, no RWMA, GR 1 DD, GLS -21.6%, normal RV SF, trivial MR    COVID-19    DDD (degenerative disc disease), cervical    DDD (degenerative disc disease), lumbar    DVT (deep venous thrombosis) (HCC) 06/23/2023   left lower extremity   Fibromyalgia    Gall stones    GERD (gastroesophageal reflux disease)    gastritis   Hemorrhoids    Hypertension    Interstitial cystitis    MCI (mild cognitive impairment) 11/25/2017   Memory difficulty 12/14/2013   Neuromuscular disorder (HCC)    sclerosis   Osteoporosis    Peptic ulcer    PONV (postoperative nausea and vomiting)    Pseudogout    Raynaud phenomenon    Rectal bleeding    Sleep apnea    a. on cpap.   Stress fracture of left tibia 11/05/2016   SVT (supraventricular tachycardia)    Syncope 09/10/2015   Thyroid  disease    hypothyroidism   Tremor, essential 05/14/2016   Past Surgical History:  Procedure Laterality Date   APPENDECTOMY     BLADDER SURGERY     x2   BLADDER SURGERY     BREAST BIOPSY     CARDIAC CATHETERIZATION     CARPAL TUNNEL RELEASE      CATARACT EXTRACTION Bilateral    CHOLECYSTECTOMY     DILATION AND CURETTAGE OF UTERUS     ENTEROCELE REPAIR     x2   EYE SURGERY     retina   herniated disc  11/25/2021   L4 and L5   hysterectomy - unknown type     JOINT REPLACEMENT     KNEE ARTHROPLASTY     KNEE SURGERY     x6   NECK SURGERY     fusion   OOPHORECTOMY     QUADRICEPS REPAIR Right    RECTOCELE REPAIR     x2   SHOULDER ARTHROSCOPY WITH SUBACROMIAL DECOMPRESSION, ROTATOR CUFF REPAIR AND BICEP TENDON REPAIR  10/06/2012   Procedure: SHOULDER ARTHROSCOPY WITH SUBACROMIAL DECOMPRESSION, ROTATOR CUFF REPAIR AND BICEP TENDON REPAIR;  Surgeon: Eva Elsie Herring, MD;  Location: [facility];  Service: Orthopedics;  Laterality: Right;  Arthroscopic  Repair  of  Subscapularis, Open Biceps Tenodesis   SHOULDER SURGERY     bilateral- bones spur   SHOULDER SURGERY Left  04/15/2021   TONSILLECTOMY     TOTAL KNEE ARTHROPLASTY     TOTAL SHOULDER ARTHROPLASTY     TRANSFORAMINAL LUMBAR INTERBODY FUSION (TLIF) WITH PEDICLE SCREW FIXATION 1 LEVEL Left 04/20/2024   Procedure: TRANSFORAMINAL LUMBAR INTERBODY FUSION (TLIF) WITH PEDICLE SCREW FIXATION 1 LEVEL;  Surgeon: Beuford Anes, MD;  Location: [facility];  Service: Orthopedics;  Laterality: Left;  LEFT-SIDED LUMBAR 4 - LUMBAR 5 TRANSFORAMINAL LUMBAR INTERBODY FUSION AND DECOMPRESSION WITH INSTRUMENTATION AND ALLOGRAFT   Patient Active Problem List   Diagnosis Date Noted   Radiculopathy 04/20/2024   History of DVT of lower extremity 08/23/2023   Pain of right sacroiliac joint 10/15/2022   Palpitations 06/02/2022   Pernicious anemia 09/11/2020   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   Weakness of both lower extremities 06/11/2020   Hyponatremia 05/22/2020   Gait instability 06/20/2019   Facet arthritis of lumbar region 05/10/2018   MCI (mild cognitive impairment) 11/25/2017   Restrictive lung disease 07/30/2017   Mixed hyperlipidemia 06/18/2017   Rectocele 04/26/2017    Irritable bowel syndrome with both constipation and diarrhea 04/26/2017   Osteoporosis 04/18/2017   Trochanteric bursitis of both hips 02/24/2017   Inflammatory arthritis 09/30/2016   High risk medication use 09/30/2016   History of Clostridium difficile colitis 09/30/2016   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   Pseudogout 09/30/2016   DDD cervical spine status post fusion 09/30/2016   DDD thoracic spine 09/30/2016   H/O total knee replacement, right 09/30/2016   Age-related osteoporosis without current pathological fracture 09/30/2016   Tremor, essential 05/14/2016   Right foot pain 04/14/2016   B12 deficiency 09/10/2015   Cervical facet joint syndrome 09/10/2015   Chronic fatigue 09/10/2015   Aortic atherosclerosis 04/01/2015   DOE (dyspnea on exertion)    Primary osteoarthritis of right knee 08/13/2014   Benign head tremor 06/11/2014   Lumbar degenerative disc disease 06/11/2014   IFG (impaired fasting glucose) 03/09/2014   Hemorrhoid 03/09/2014   Retinal wrinkling, right eye 03/09/2014   Fibromyalgia 03/09/2014   GERD (gastroesophageal reflux disease) 03/09/2014   History of arthroplasty of right knee 01/31/2014   Hemorrhoids, external, thrombosed 06/22/2011   Hypertension 03/11/2011   SVT (supraventricular tachycardia)    Raynaud phenomenon    Nonspecific (abnormal) findings on radiological and other examination of body structure 08/25/2010   COMPUTERIZED TOMOGRAPHY, CHEST, ABNORMAL 08/25/2010   OSA (obstructive sleep apnea) 04/24/2009   Allergic rhinitis 06/11/2008   Dyspnea 06/11/2008   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   Chronic diastolic CHF (congestive heart failure) (HCC) 06/08/2008   INTERSTITIAL CYSTITIS 06/08/2008    PCP: Al[facility]  REFERRING PROVIDER: Beuford Anes  REFERRING DIAG: s/p Lumbar fusion, Lt trochanteric bursitis  Rationale for Evaluation and Treatment: Rehabilitation  THERAPY DIAG:  Muscle weakness  (generalized)  Other low back pain  Dizziness and giddiness  ONSET DATE: 04/20/24  SUBJECTIVE:  SUBJECTIVE STATEMENT: Pt reports walking a lot over the weekend, on concrete floor, aggravating her low back. She states she was not able to go to church the next day due to pain.    EVAL: Pt is s/p L4/5 fusion on 04/20/24. She had been wearing back brace until 2 weeks ago and is now cleared for all activity. She states she has the most difficulty with bending and with walking longer than 20-30 minutes. She states she has pain in her mid back and Lt lower back. Pt has been using tylenol  and lidocaine  patches for pain control. Pt also has Lt trochanteric bursitis and had an injection 07/14/24.  PERTINENT HISTORY:  DDD, osteoporosis, vertigo  PAIN:  Are you having pain? Yes: NPRS scale: 8/10 Pain location: Lt hip, mid back  Pain description: sore, ache Aggravating factors: walking, bending Relieving factors: meds, pain patch  PRECAUTIONS: Fall and Other: per pt MD recommends no heavy lifting, osteoporosis  RED FLAGS: None   WEIGHT BEARING RESTRICTIONS: No  FALLS:  Has patient fallen in last 6 months? No   OCCUPATION: enjoys home projects, community activities  PLOF: Independent  PATIENT GOALS: decrease pain  NEXT MD VISIT: December 2025  OBJECTIVE:  Note: Objective measures were completed at Evaluation unless otherwise noted.  DIAGNOSTIC FINDINGS:  L4/5 fusion  PATIENT SURVEYS:  Modified Oswestry: 17/50   COGNITION: Overall cognitive status: Within functional limits for tasks assessed       POSTURE:  Low tone PALPATION: Mild increase in mm spasticity bilat lumbar paraspinals  LUMBAR ROM:   AROM eval  Flexion 70%  Extension To neutral  - pain  Right lateral flexion WFL - pain  end range  Left lateral flexion WFL  Right rotation 50% - pain Lt side  Left rotation 70%   (Blank rows = not tested)  LOWER EXTREMITY ROM:    Grossly WFL bilat hips   LOWER EXTREMITY MMT:    MMT Right eval Left eval    Hip flexion 3- 3    Hip extension 3- 3    Hip abduction 3- 3    Hip adduction      Hip internal rotation      Hip external rotation 3- 3    Knee flexion      Knee extension      Ankle dorsiflexion      Ankle plantarflexion      Ankle inversion      Ankle eversion       (Blank rows = not tested)  LUMBAR SPECIAL TESTS:  Straight leg raise test: Negative    GAIT: Distance walked: 100' Assistive device utilized: None Level of assistance: Complete Independence Comments: wide BOS  TREATMENT DATE:  [facility] Adult PT Treatment:                                                DATE: 08/16/24 Therapeutic Exercise: Hook lying clamshells with green band x30 Low trunk rotation x20  Seated QL stretch left side 2x20 sec  Manual Therapy: STM to left QL- moderate tissue restriction  Self tissue mobilization using tennis ball on QL muscle against wall for decreased tissue restriction  Neuromuscular re-ed: Posterior pelvic tilt in supine x20  Pelvic tilt + heel drag on stability ball x20  Modalities: Ice pack to left QL for 10 min     [facility]  Adult PT Treatment:                                                DATE: 08/09/24 Therapeutic Exercise: Nustep level 4 for 5 min- SPT present to discuss pt status Hook lying clamshells with green band 2x12 Low trunk rotation x20  Neuromuscular re-ed: Glute bridges 2x10  Supine adductor squeeze with ball between knees 2x10 - verbal cues for pt to engage core while contracting LE Therapeutic Activity: Standing hip abduction with 2 lb ankle weights - verbal and tactile cues to keep hips neutral  LAQ using 2 lb ankle weights x5 each leg Ambulated half lap around ortho gym with 2 lb ankle weights Self Care: Patient education  on dry needling and ultrasound treatment   07/26/24 See HEP Pt educated on PT POC and goals, HEP, rationale for treatment                                                                                                                                 PATIENT EDUCATION:  Education details: See treatment  Person educated: Patient Education method: Explanation and Demonstration Education comprehension: verbalized understanding and returned demonstration  HOME EXERCISE PROGRAM: Access Code: [email or web address] URL: [email or web address] Date: 08/09/2024 Prepared by: Darice Conine Exercises - Supine March  - 1 x daily - 7 x weekly - 2 sets - 10 reps - Supine Bridge  - 1 x daily - 7 x weekly - 2 sets - 10 reps - Hooklying Clamshell with Resistance  - 1 x daily - 7 x weekly - 2 sets - 10 reps - Supine Hip Adduction Isometric with Ball  - 1 x daily - 7 x weekly - 2 sets - 10 reps - Standing Hip Abduction with Counter Support  - 1 x daily - 7 x weekly - 2 sets - 10 reps - Supine Lower Trunk Rotation  - 1 x daily - 7 x weekly - 2 sets - 10 reps   ASSESSMENT:  CLINICAL IMPRESSION:   Pt presents with 8/10 pain in left hip and mid back from events she participated in over the weekend. Manual therapy demonstrated muscle tightness in the QL muscle,thus performed QL release pt responded well too, followed by QL stretch. Required verbal cues during posterior pelvic tilts to 'flatten back into table'. Progress back, core strengthening for next visit, and continue stretches.   EVAL: Patient is a 73 y.o. female who was seen today for physical therapy evaluation and treatment for s/p lumbar fusion and Lt trochanteric bursitis. Pt presents with significant deficits in hip and core strength, impaired gait, balance and decreased functional activity tolerance. Pt will benefit from skilled PT to address deficits and improve functional mobility.   OBJECTIVE IMPAIRMENTS: decreased activity tolerance, decreased ROM,  decreased strength, postural dysfunction, and pain.  ACTIVITY LIMITATIONS: lifting, bending, standing, and locomotion level  PARTICIPATION LIMITATIONS: shopping, community activity, and yard work  PERSONAL FACTORS: Past/current experiences, Time since onset of injury/illness/exacerbation, and 1-2 comorbidities: bursitis, neuromuscular disorder are also affecting patient's functional outcome.   REHAB POTENTIAL: Good  CLINICAL DECISION MAKING: Evolving/moderate complexity  EVALUATION COMPLEXITY: Moderate   GOALS: Goals reviewed with patient? Yes  SHORT TERM GOALS: Target date: 08/23/2024    Pt will be independent in initial HEP Baseline: Goal status: Progressing   2.  Pt will improve LE strength to 4/5 to improve standing and walking tolerance Baseline:  Goal status: INITIAL    LONG TERM GOALS: Target date: 09/20/2024    Pt will be independent with advanced HEP Baseline:  Goal status: INITIAL  2.  Pt will improve modified oswestry to <= 7/50 to demo improved functional mobility Baseline:  Goal status: INITIAL  3.  Pt will tolerate walking x 30 minutes with pain <= 2/10 to complete shopping tasks Baseline:  Goal status: INITIAL  4.  Pt will be able to bend to pick up small item dropped on floor with pain <= 2/10 Baseline:  Goal status: INITIAL   PLAN:  PT FREQUENCY: 1-2x/week  PT DURATION: 8 weeks  PLANNED INTERVENTIONS: 97164- PT Re-evaluation, 97110-Therapeutic exercises, 97530- Therapeutic activity, 97112- Neuromuscular re-education, 97535- Self Care, [id number]- Manual therapy, [id number]- Electrical stimulation (unattended), 20560 (1-2 muscles), 20561 (3+ muscles)- Dry Needling, Patient/Family education, Balance training, Taping, Cryotherapy, and Moist heat.  PLAN FOR NEXT SESSION: Assess response to HEP, hip and core strength, check short term goals next visit, QL stretch, ice as needed,   Lavanda Cleverly, SPT 08/16/24 11:50 AM

## 2024-08-16 NOTE — Progress Notes (Signed)
"  Laura Roman presents today for   Chief Complaint   Patient presents with    6 Month Follow-Up    Diabetes       Have you been to the ER, urgent care clinic since your last visit?  Hospitalized since your last visit?    NO    Have you seen or consulted any other health care providers outside of [facility] since your last visit?    YES - When: approximately 5 months ago.  Where and Why: Dr. Rubin: colonoscopy.             "

## 2024-08-16 NOTE — Progress Notes (Signed)
"  Verbal order read back per Dr. Dumaran.  Patient received High Dose Influenza vaccine in RIGHT deltoid.  Patient tolerated well and left without complaints.  Patient received VIS.     "

## 2024-08-16 NOTE — Progress Notes (Signed)
 "73 y.o. female who presents for evaluation.    She continues to see Dr Nedelka and Dr Jannine for the LEFT breast ca.  She tried taking gaba all day but it made her groggy in daytime so only taking at night.  She thinks it might be getting worse, more numb and possibly going above the ankles.  Open to seeing neuro at this point    No cp, sob, pnd, edema, minimal exercise, just adls    No gu issues. Has anal fissures and underwent colo by Dr Rubin earlier this summer, no records for review.  Seeing Dr Squatrito yearly for the endomet ca.  She is c/o globus and swallowing problems, already on lanso.  No wt loss, bleeding, alarm complaints    Also having some sinus and left ear fullness but no pain, drainage although she has postnasal drip.  No fever, purulence, facial pain, throat pain    Denies polyuria, polydipsia, nocturia, vision change. 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 Larri    DM (diabetes mellitus) (HCC) 10/2017    on basis of fbs>125; intol metformin    Dyslipidemia     Endometrial cancer (HCC) 02/2017    Dr Margret; stage 1B gr 1 endometroid adenoca w foci squamous diff of the endometrium; RA TLH BSO PLND, 0/13LN, MSI intact    Familial tremor     bro/dad have it as well    Frozen shoulder     right Dr. 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 Arlice, Dr. Larri    Osteopenia     DEXA -0.5 spine, -0.8 hip (1/18); 0.5 spine, -1.3 hip (3/22); -1.1 wrist, -1.1 hip (4/23)    Overweight (BMI 25.0-29.9)     IF 7/18 start weight 168 lbs not doing     Painless hematuria 04/2016    Dr Trudy, neg eval    Palpitations 2008    neg thallium 2008, nl holter 2008, echo nl lv/ef 65%/tr mr/dd/nl pasp    Syncope     neurocardiogenic by tilt 1994    TMJ syndrome 2023    LEFT after [place]isd[place] tooth surgery    Venous insufficiency     Vitamin D deficiency      Past Surgical History:   Procedure Laterality Date    BREAST LUMPECTOMY  07/2020  and LN dissection Dr Cloria    COLONOSCOPY      Dr Rubin (2007) neg; (04/22/18) neg    HEMORRHOID SURGERY      Dr. Rubin 2007    HYSTERECTOMY (CERVIX STATUS UNKNOWN)  03/11/2017    Dr Margret    HYSTERECTOMY, TOTAL ABDOMINAL (CERVIX REMOVED)      TUBAL LIGATION      UROLOGICAL SURGERY  06/2016    Dr Trudy; bladder bx showed benign lesions    US  BREAST BIOPSY W LOC DEVICE 1ST LESION LEFT Left 02/21/2020    US  BREAST NEEDLE BIOPSY LEFT 02/21/2020 [facility] US      Social History     Socioeconomic History    Marital status: Married     Spouse name: Not on file    Number of children: 2    Years of education: Not on file    Highest education level: Not on file   Occupational History    Occupation: ret benefits specialist   Tobacco Use    Smoking status: Former     Passive exposure: Never    Smokeless tobacco: Never   Vaping Use    Vaping status: Never Used   Substance and Sexual Activity    Alcohol use: Yes     Alcohol/week: 1.0 standard drink of alcohol     Types: 1 Shots of liquor per week     Comment: One drink a week since breast cancer recovery    Drug use: No    Sexual activity: Not Currently     Partners: Male   Other Topics Concern    Not on  file   Social History Narrative    Not on file     Social Drivers of Health     Financial Resource Strain: Low Risk  (08/10/2023)    Overall Financial Resource Strain (CARDIA)     Difficulty of Paying Living Expenses: Not hard at all   Food Insecurity: No Food Insecurity (02/14/2024)    Hunger Vital Sign     Worried About Running Out of Food in the Last Year: Never true     Ran Out of Food in the Last Year: Never true   Transportation Needs: No Transportation Needs (02/14/2024)    PRAPARE - Therapist, Art (Medical): No     Lack of Transportation (Non-Medical): No   Physical Activity: Unknown (02/14/2024)    Exercise Vital Sign     Days of Exercise per Week: 0 days     Minutes of Exercise per Session: Patient declined   Stress: Not on file   Social Connections: Not on file   Intimate Partner Violence: Not on file   Housing Stability: Low Risk  (02/14/2024)    Housing Stability Vital Sign     Unable to Pay for Housing in the Last Year: No     Number of Times Moved in the Last Year: 0     Homeless in the Last Year: No     Current Outpatient Medications   Medication Sig    predniSONE  (DELTASONE ) 20 MG tablet Take 1 tablet by mouth daily for 5 days    JANUVIA  100 MG tablet TAKE 1 TABLET BY MOUTH ONCE DAILY    atorvastatin  (LIPITOR) 40 MG tablet take 1 tablet by mouth once daily    lansoprazole  (PREVACID ) 30 MG delayed release capsule take 1 capsule by mouth once daily before breakfast    citalopram  (CELEXA ) 10 MG tablet take  1 tablet by mouth once daily    gabapentin  (NEURONTIN ) 300 MG capsule Take 1 capsule by mouth daily.    Probiotic CHEW Take by mouth    Multiple Vitamins-Minerals (MULTIVITAMIN ADULTS 50+ PO) Take 1 tablet by mouth daily    Cholecalciferol (VITAMIN D3) 1.25 MG (50000 UT) CAPS Take 1 capsule by mouth once a week    Lancets MISC Use daily as directed    aspirin 81 MG chewable tablet Take 1 tablet by mouth daily    ELDERBERRY PO Take by mouth (Patient not taking: Reported on  08/16/2024)     No current facility-administered medications for this visit.     Allergies   Allergen Reactions    Lidocaine Other (See Comments)     anxious    Metformin Diarrhea    Nabumetone Nausea Only    Thimerosal (Thiomersal) Hives     REVIEW OF SYSTEMS:  mammo 4/25, colo 2025 Dr Rubin, DEXA 3/24    Vitals:    08/16/24 1114   BP: 120/73   Pulse: 86   Resp: 16   Temp: 98.3 F (36.8 C)   TempSrc: Temporal   SpO2: 98%   Weight: 73 kg (161 lb)   Height: 1.626 m (5' 4)   Affect is appropriate.  Mood stable  No apparent distress  HEENT --Anicteric sclerae.  No JVD, or bruits.  Thyroid fullness on left. Afl on left without erythema, no nodes  Lungs --Clear to auscultation and percussion, normal percussion.  Heart --Regular rate and rhythm, no murmurs, rubs, gallops, or clicks.  Abdomen -- Soft and nontender, no hepatosplenomegaly or masses.  Extremities -- Without cyanosis, clubbing, tr ankle edema notd. 2+ pulses equally and bilaterally.  Varicose veins bilat  Mild action tremor noted intermittently bilat hands    LABS  From 5/10 showed   gluc 110, cr 0.70,               alt 23,                                   chol 154, tg 143, hdl 45, ldl-c 80,                                                            tsh 1.91  From 5/10 showed                       2 hr GTT 89  From 8/10 showed                                                                vit d 23.0, ck 57, aldo 5.4  From 8/11 showed   gluc 108,                                     hba1c 6.3,  chol 160, tg 172, hdl 39, ldl-c 87,  wbc 5.,7 hb 12.3, plt 235, ua neg,     tsh 2.28  From 5/12 showed   gluc 113, cr 0.71, gfr 94,  alt 16, hba1c 6.2, ldl-p 1989, chol 177, tg 161, hdl 43, ldl-c 102  From 11/12 showed                                                     hba1c 5.9, ldl-p 1218, chol 133, tg 116, hdl 45, ldl-c 65  From 5/13 showed   gluc 105, cr 0.55, gfr 102, alt 8,  hba1c 6.2,                   chol 148, tg 107, hdl 45, ldl-c 82,  wbc  5.0, hb 12.5, plt 172, vit d 40.3  From 11/13 showed         hba1c 6.2, ldl-p 1888, chol 176, tg 155, hdl 49, ldl-c 86,  wbc 6.2, hb 12.3, plt 182, vit d 34.4  From 5/14 showed         hba1c 6.2,      chol 152, tg 157, hdl 39, ldl-c 82  From 1/15 showed   gluc 113, cr 0.68, gfr>60, alt 11, hba1c 6.2,      chol 146, tg 130, hdl 43, ldl-c 77  From 7/15 showed         hba1c 6.3,      chol 133, tg 124, hdl 39, ldl-c 69,  wbc 6.3, hb 12.2, plt 193  From 6/16 showed   gluc 106, cr 0.74, gfr>60, alt 26, hba1c 6.3,      chol 126, tg 125, hdl 46, ldl-c 55,  wbc 5.7, hb 13.2, plt 203, vit d 7.2,   tsh 1.69  From 12/16 showed gluc 110, cr 0.68, gfr>60, alt 30, hba1c 6.1,      chol 135, tg 147, hdl 47, ldl-c 59,  wbc 6.0, hb 12.7, plt 197  From 6/17 showed   gluc 122, cr 0.71, gfr>60, alt 33, hba1c 6.2,      chol 153, tg 140, hdl 46, ldl-c 79,  wbc 6.3, hb 13.1, plt 190,           tsh 1.92, hep c-  From 1/18 showed         hba1c 6.2,     chol 154, tg 181, hdl 41, ldl-c 77  From 7/18 showed   gluc 129, cr 0.63, gfr>60, alt 26, hba1c 6.1,                 wbc 6.0, hb 12.4, plt 193, vit d 79  From 1/19 showed   gluc 132, cr 0.83, gfr>60,     hba1c 6.1  From 4/19 showed         hba1c 7.0, umar 12 ,   chol 124, tg 109, hdl 46, ldl-c 56,                    vit d 56.1  From 7/19 showed   gluc 106, cr 0.67, gfr>60, alt 25, hba1c 6.0,                 wbc 5.8, hb 13.0, plt 183  From 1/20 showed        hba1c  6.0, umar 17,    chol 130, tg 113, hdl 46, ldl-c 61  From 8/20 showed   gluc 103, cr 0.73, gfr>60, alt 27, hba1c 5.7,       chol 124, tg 150, hdl 44, ldl-c 50, wbc 5.0, hb13.0, plt 190  Form 2/21 showed         hba1c 5.9, umar na,             vit d 88.9  From 8/21 showed   gluc 109, cr 0.68, gfr>60, alt 35, hba1c 5.5,       chol 156, tg 142, hdl 51, ldl-c 77, wbc 4.2, hb 9.9,   plt 152, vit d 34.8  From 3/22 showed   gluc 100, cr 0.68, gfr>60, alt 22, hba1c 5.7,       chol 129, tg 137, hdl 48, ldl-c 54  From 9/22 showed   gluc 110, cr  0.72, gfr>60, alt 24, hba1c 5.8,       chol 128, tg 129, hdl 44, ldl-c 58, wbc 5.6, hb 12.0, plt 184  From 3/23 showed   gluc 115, cr 0.65, gfr>60,     hba1c 5.7,               vit d 93.3  From 3/24 showed   gluc 120, cr 0.70, gfr>60,     hba1c 5.7  From 10/24 showed gluc 108, cr 0.78, gfr>60, alt 16, hba1c 5.9, umar 22,     chol 114, tg 135, hdl 46, ldl-c 41, wbc 8.4, hb 12.8, plt 208  From 4/25 showed   gluc 114, cr 0.72, gfr 89,     hba1c 5.7,               tsh 2.05, ft4 1.00    Results for orders placed or performed during the hospital encounter of 08/08/24 (from the past 2160 hours)   HEMOGLOBIN A1C W/O EAG   Result Value Ref Range    Hemoglobin A1C 6.0 (H) 4.2 - 5.6 %   Comprehensive Metabolic Panel   Result Value Ref Range    Sodium 139 136 - 145 mmol/L    Potassium 4.3 3.5 - 5.5 mmol/L    Chloride 101 98 - 107 mmol/L    CO2 27 21 - 32 mmol/L    Anion Gap 11 3.0 - 18.0 mmol/L    Glucose 106 74 - 108 mg/dL    BUN 12 6 - 23 MG/DL    Creatinine 9.13 0.6 - 1.3 MG/DL    BUN/Creatinine Ratio 14 12 - 20      Est, Glom Filt Rate 72 >60 ml/min/1.33m2    Calcium  8.9 8.6 - 10.2 MG/DL    Total Bilirubin 0.4 0.2 - 1.0 MG/DL    ALT 14 10 - 35 U/L    AST 21 10 - 38 U/L    Alk Phosphatase 87 45 - 117 U/L    Total Protein 6.9 6.4 - 8.2 g/dL    Albumin 3.4 3.4 - 5.0 g/dL    Globulin 3.5 2.0 - 4.0 g/dL    Albumin/Globulin Ratio 1.0 0.8 - 1.7     CBC with Auto Differential   Result Value Ref Range    WBC 8.1 4.6 - 13.2 K/uL    RBC 4.15 (L) 4.20 - 5.30 M/uL    Hemoglobin 12.5 12.0 - 16.0 g/dL    Hematocrit 59.6 64.9 - 45.0 %    MCV 97.1 78.0 - 100.0 FL    MCH  30.1 24.0 - 34.0 PG    MCHC 31.0 31.0 - 37.0 g/dL    RDW 86.4 88.3 - 85.4 %    Platelets 206 135 - 420 K/uL    MPV 9.8 9.2 - 11.8 FL    Nucleated RBCs 0.0 0 PER 100 WBC    nRBC 0.00 0.00 - 0.01 K/uL    Neutrophils % 76.8 (H) 40.0 - 73.0 %    Lymphocytes % 14.0 (L) 21.0 - 52.0 %    Monocytes % 7.3 3.0 - 10.0 %    Eosinophils % 1.4 0.0 - 5.0 %    Basophils % 0.1 0.0 - 2.0 %     Immature Granulocytes % 0.4 0.0 - 0.5 %    Neutrophils Absolute 6.21 1.80 - 8.00 K/UL    Lymphocytes Absolute 1.13 0.90 - 3.60 K/UL    Monocytes Absolute 0.59 0.05 - 1.20 K/UL    Eosinophils Absolute 0.11 0.00 - 0.40 K/UL    Basophils Absolute 0.01 0.00 - 0.10 K/UL    Immature Granulocytes Absolute 0.03 0.00 - 0.04 K/UL    Differential Type AUTOMATED     Lipid Panel   Result Value Ref Range    Cholesterol, Total 135 MG/DL    Triglycerides 860 0 - 150 MG/DL    HDL 43 40 - 60 MG/DL    LDL Cholesterol 64 0 - 100 MG/DL    VLDL Cholesterol Calculated 28 MG/DL    Chol/HDL Ratio 3.2 0 - 5.0       We reviewed the patient's labs from the last several visits to point out trends in the numbers        Patient Active Problem List   Diagnosis    Dyslipidemia    Anxiety    Vitamin D deficiency    History of breast cancer    Neuropathy due to chemotherapeutic drug    Overweight with body mass index (BMI) of 25 to 25.9 in adult    Arthritis, degenerative    Controlled type 2 diabetes mellitus, without long-term current use of insulin (HCC)    Familial tremor       Assessment and plan:  1.  DM.  Continue januvia  and controlled.  F/u ophth  2.  Hyperlipidemia. Continue lipitor   3.  Hypovitaminosis D. Supplementation per onco  4.  Anxiety.  Continue celexa   5.  Overweight.  Lifestyle and dietary measures.  Portion control reiterated.  6.  Endometrial ca.  Per Dr Margret  7.  Breast ca.  Per Dr S Damle and Dr Nedelka  8.  Possible neuropathy from chemo, now worsening.  Continue gaba, will send to neuro for consult  9.  Venous disease.  Elevate, declined diuretic  10.  Familiar tremor.  Declined meds or neuro consult and will observe  11.  GERD and dysphagia.  Quick discussion, will send to gi for possible egd, continue ppi  12.  Eustachian tube dysfunction.  She will add antihistamine, pred burst, flonase if needed. Ent if no better        RTC 4/26    Above conditions discussed at length and patient vocalized understanding.  All  questions answered to patient satisfaction     Diagnosis Orders   1. Need for immunization against influenza  Influenza, FLUAD Trivalent, (age 11 y+), IM, Preservative Free, 0.5mL    External Referral To Gastroenterology      2. Gastroesophageal reflux disease, unspecified whether esophagitis present  External Referral To Gastroenterology  3. Dysphagia, unspecified type  External Referral To Gastroenterology      4. Neuropathy due to chemotherapeutic drug  External Referral To Neurology      5. Dysfunction of left eustachian tube        6. Allergic rhinitis, unspecified seasonality, unspecified trigger  predniSONE  (DELTASONE ) 20 MG tablet            TOTAL time 45 min spent of which at least 35 min was on counseling, answering questions and coordination of care                  "

## 2024-08-17 ENCOUNTER — Inpatient Hospital Stay
Admit: 2024-08-17 | Discharge: 2024-10-16 | Payer: BLUE CROSS/BLUE SHIELD | Attending: Radiation Oncology | Primary: Internal Medicine

## 2024-08-17 DIAGNOSIS — C50512 Malignant neoplasm of lower-outer quadrant of left female breast: Principal | ICD-10-CM

## 2024-08-18 ENCOUNTER — Telehealth: Payer: Self-pay | Admitting: Family Medicine

## 2024-08-18 DIAGNOSIS — G4733 Obstructive sleep apnea (adult) (pediatric): Secondary | ICD-10-CM

## 2024-08-18 NOTE — Telephone Encounter (Signed)
 Lease call patient and let her know that we did get her compliance report for her CPAP she did miss a few days but I am also concerned that the pressure did not get adjusted on her machine and it was supposed to be bumped up to 14 which is why we wear it had asked them for a repeat compliance report.  It still set on 12 and that is why her apnea still not maximally treated.  We will try to reprint and resend the order.  Please reprint order and get to the compancy

## 2024-08-21 MED ORDER — AMBULATORY NON FORMULARY MEDICATION
0 refills | Status: DC
Start: 2024-08-21 — End: 2024-09-11

## 2024-08-21 NOTE — Telephone Encounter (Signed)
 Spoke w/pt and advised of this. She stated that she had 34 incidents last night.   Order printed and faxed to [phone]

## 2024-08-23 ENCOUNTER — Encounter: Payer: Self-pay | Admitting: Physical Therapy

## 2024-08-23 ENCOUNTER — Ambulatory Visit: Admitting: Physical Therapy

## 2024-08-23 DIAGNOSIS — R42 Dizziness and giddiness: Secondary | ICD-10-CM

## 2024-08-23 DIAGNOSIS — M5459 Other low back pain: Secondary | ICD-10-CM

## 2024-08-23 DIAGNOSIS — M6281 Muscle weakness (generalized): Secondary | ICD-10-CM

## 2024-08-23 NOTE — Therapy (Signed)
 OUTPATIENT PHYSICAL THERAPY THORACOLUMBAR TREATMENT    Patient Name: Mia [name] MRN: [id number] DOB:Mar 16, 1951, 74 y.o., female Today's Date: 08/23/2024  END OF SESSION:  PT End of Session - 08/23/24 1101     Visit Number 4    Number of Visits 8    Date for Recertification  09/20/24    Authorization Type medicare    Authorization - Visit Number 6    Progress Note Due on Visit 10    PT Start Time 1101    PT Stop Time 1145    PT Time Calculation (min) 44 min    Activity Tolerance Patient tolerated treatment well    Behavior During Therapy [facility] for tasks assessed/performed             Past Medical History:  Diagnosis Date   Anal fissure    Anemia    Arthritis    Blood transfusion    C. difficile colitis    Chest pain    a. 01/2013 MV: EF 59%, no ischemia.   Chronic Dyspnea    a. 01/2013 Echo: EF 60-65%, Gr 1 DD, PASP 81mmHg.// Echo 01/2020: EF 60-65, no RWMA, GR 1 DD, GLS -21.6%, normal RV SF, trivial MR    COVID-19    DDD (degenerative disc disease), cervical    DDD (degenerative disc disease), lumbar    DVT (deep venous thrombosis) (HCC) 06/23/2023   left lower extremity   Fibromyalgia    Gall stones    GERD (gastroesophageal reflux disease)    gastritis   Hemorrhoids    Hypertension    Interstitial cystitis    MCI (mild cognitive impairment) 11/25/2017   Memory difficulty 12/14/2013   Neuromuscular disorder (HCC)    sclerosis   Osteoporosis    Peptic ulcer    PONV (postoperative nausea and vomiting)    Pseudogout    Raynaud phenomenon    Rectal bleeding    Sleep apnea    a. on cpap.   Stress fracture of left tibia 11/05/2016   SVT (supraventricular tachycardia)    Syncope 09/10/2015   Thyroid  disease    hypothyroidism   Tremor, essential 05/14/2016   Past Surgical History:  Procedure Laterality Date   APPENDECTOMY     BLADDER SURGERY     x2   BLADDER SURGERY     BREAST BIOPSY     CARDIAC CATHETERIZATION     CARPAL TUNNEL RELEASE      CATARACT EXTRACTION Bilateral    CHOLECYSTECTOMY     DILATION AND CURETTAGE OF UTERUS     ENTEROCELE REPAIR     x2   EYE SURGERY     retina   herniated disc  11/25/2021   L4 and L5   hysterectomy - unknown type     JOINT REPLACEMENT     KNEE ARTHROPLASTY     KNEE SURGERY     x6   NECK SURGERY     fusion   OOPHORECTOMY     QUADRICEPS REPAIR Right    RECTOCELE REPAIR     x2   SHOULDER ARTHROSCOPY WITH SUBACROMIAL DECOMPRESSION, ROTATOR CUFF REPAIR AND BICEP TENDON REPAIR  10/06/2012   Procedure: SHOULDER ARTHROSCOPY WITH SUBACROMIAL DECOMPRESSION, ROTATOR CUFF REPAIR AND BICEP TENDON REPAIR;  Surgeon: Eva Elsie Herring, MD;  Location: [facility];  Service: Orthopedics;  Laterality: Right;  Arthroscopic  Repair  of  Subscapularis, Open Biceps Tenodesis   SHOULDER SURGERY     bilateral- bones spur   SHOULDER SURGERY  Left 04/15/2021   TONSILLECTOMY     TOTAL KNEE ARTHROPLASTY     TOTAL SHOULDER ARTHROPLASTY     TRANSFORAMINAL LUMBAR INTERBODY FUSION (TLIF) WITH PEDICLE SCREW FIXATION 1 LEVEL Left 04/20/2024   Procedure: TRANSFORAMINAL LUMBAR INTERBODY FUSION (TLIF) WITH PEDICLE SCREW FIXATION 1 LEVEL;  Surgeon: Beuford Anes, MD;  Location: [facility] OR;  Service: Orthopedics;  Laterality: Left;  LEFT-SIDED LUMBAR 4 - LUMBAR 5 TRANSFORAMINAL LUMBAR INTERBODY FUSION AND DECOMPRESSION WITH INSTRUMENTATION AND ALLOGRAFT   Patient Active Problem List   Diagnosis Date Noted   Radiculopathy 04/20/2024   History of DVT of lower extremity 08/23/2023   Pain of right sacroiliac joint 10/15/2022   Palpitations 06/02/2022   Pernicious anemia 09/11/2020   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   Weakness of both lower extremities 06/11/2020   Hyponatremia 05/22/2020   Gait instability 06/20/2019   Facet arthritis of lumbar region 05/10/2018   MCI (mild cognitive impairment) 11/25/2017   Restrictive lung disease 07/30/2017   Mixed hyperlipidemia 06/18/2017   Rectocele 04/26/2017    Irritable bowel syndrome with both constipation and diarrhea 04/26/2017   Osteoporosis 04/18/2017   Trochanteric bursitis of both hips 02/24/2017   Inflammatory arthritis 09/30/2016   High risk medication use 09/30/2016   History of Clostridium difficile colitis 09/30/2016   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   Pseudogout 09/30/2016   DDD cervical spine status post fusion 09/30/2016   DDD thoracic spine 09/30/2016   H/O total knee replacement, right 09/30/2016   Age-related osteoporosis without current pathological fracture 09/30/2016   Tremor, essential 05/14/2016   Right foot pain 04/14/2016   B12 deficiency 09/10/2015   Cervical facet joint syndrome 09/10/2015   Chronic fatigue 09/10/2015   Aortic atherosclerosis 04/01/2015   DOE (dyspnea on exertion)    Primary osteoarthritis of right knee 08/13/2014   Benign head tremor 06/11/2014   Lumbar degenerative disc disease 06/11/2014   IFG (impaired fasting glucose) 03/09/2014   Hemorrhoid 03/09/2014   Retinal wrinkling, right eye 03/09/2014   Fibromyalgia 03/09/2014   GERD (gastroesophageal reflux disease) 03/09/2014   History of arthroplasty of right knee 01/31/2014   Hemorrhoids, external, thrombosed 06/22/2011   Hypertension 03/11/2011   SVT (supraventricular tachycardia)    Raynaud phenomenon    Nonspecific (abnormal) findings on radiological and other examination of body structure 08/25/2010   COMPUTERIZED TOMOGRAPHY, CHEST, ABNORMAL 08/25/2010   OSA (obstructive sleep apnea) 04/24/2009   Allergic rhinitis 06/11/2008   Dyspnea 06/11/2008   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   Chronic diastolic CHF (congestive heart failure) (HCC) 06/08/2008   INTERSTITIAL CYSTITIS 06/08/2008    PCP: Al[facility]  REFERRING PROVIDER: Beuford Anes  REFERRING DIAG: s/p Lumbar fusion, Lt trochanteric bursitis  Rationale for Evaluation and Treatment: Rehabilitation  THERAPY DIAG:  Muscle weakness  (generalized)  Other low back pain  Dizziness and giddiness  ONSET DATE: 04/20/24  SUBJECTIVE:  SUBJECTIVE STATEMENT: Pt reports going to the mountains with her [place] and played cards with her friends.  She doesn't have much pain in the left hip.    EVAL: Pt is s/p L4/5 fusion on 04/20/24. She had been wearing back brace until 2 weeks ago and is now cleared for all activity. She states she has the most difficulty with bending and with walking longer than 20-30 minutes. She states she has pain in her mid back and Lt lower back. Pt has been using tylenol  and lidocaine  patches for pain control. Pt also has Lt trochanteric bursitis and had an injection 07/14/24.  PERTINENT HISTORY:  DDD, osteoporosis, vertigo  PAIN:  Are you having pain? Yes: NPRS scale: 1/10 Pain location: Lt hip, mid back  Pain description: sore, ache Aggravating factors: walking, bending Relieving factors: meds, pain patch  PRECAUTIONS: Fall and Other: per pt MD recommends no heavy lifting, osteoporosis  RED FLAGS: None   WEIGHT BEARING RESTRICTIONS: No  FALLS:  Has patient fallen in last 6 months? No   OCCUPATION: enjoys home projects, community activities  PLOF: Independent  PATIENT GOALS: decrease pain  NEXT MD VISIT: December 2025  OBJECTIVE:  Note: Objective measures were completed at Evaluation unless otherwise noted.  DIAGNOSTIC FINDINGS:  L4/5 fusion  PATIENT SURVEYS:  Modified Oswestry: 17/50   COGNITION: Overall cognitive status: Within functional limits for tasks assessed       POSTURE:  Low tone PALPATION: Mild increase in mm spasticity bilat lumbar paraspinals  LUMBAR ROM:   AROM eval  Flexion 70%  Extension To neutral  - pain  Right lateral flexion WFL - pain end range  Left lateral  flexion WFL  Right rotation 50% - pain Lt side  Left rotation 70%   (Blank rows = not tested)  LOWER EXTREMITY ROM:    Grossly WFL bilat hips   LOWER EXTREMITY MMT:    MMT Right eval Left eval Right 08/23/24 Left    Hip flexion 3- 3 3 3   Hip extension 3- 3 4 4   Hip abduction 3- 3 4- 4-  Hip adduction      Hip internal rotation      Hip external rotation 3- 3    Knee flexion      Knee extension      Ankle dorsiflexion      Ankle plantarflexion      Ankle inversion      Ankle eversion       (Blank rows = not tested)  LUMBAR SPECIAL TESTS:  Straight leg raise test: Negative    GAIT: Distance walked: 100' Assistive device utilized: None Level of assistance: Complete Independence Comments: wide BOS  TREATMENT DATE:  [facility] Adult PT Treatment:                                                DATE: 08/23/24 Therapeutic Exercise: Nustep level 3 for 5 min- SPT present to discuss pt status Low trunk rotation x20  Hamstring stretch left side 2x20 sec  Manual Therapy: STM to left QL- tightness restriction  Neuromuscular re-ed: Posterior pelvic tilt 2x10 Glute bridges 2x10 Pelvic tilt + heel drag on stability ball x20  Modalities: Ice pack to left QL for 10 min    [facility] Adult PT Treatment:  DATE: 08/16/24 Therapeutic Exercise: Hook lying clamshells with green band x30 Low trunk rotation x20  Seated QL stretch left side 2x20 sec  Manual Therapy: STM to left QL- moderate tissue restriction  Self tissue mobilization using tennis ball on QL muscle against wall for decreased tissue restriction  Neuromuscular re-ed: Posterior pelvic tilt in supine x20  Pelvic tilt + heel drag on stability ball x20  Modalities: Ice pack to left QL for 10 min     [facility] Adult PT Treatment:                                                DATE: 08/09/24 Therapeutic Exercise: Nustep level 4 for 5 min- SPT present to discuss pt status Hook lying  clamshells with green band 2x12 Low trunk rotation x20  Neuromuscular re-ed: Glute bridges 2x10  Supine adductor squeeze with ball between knees 2x10 - verbal cues for pt to engage core while contracting LE Therapeutic Activity: Standing hip abduction with 2 lb ankle weights - verbal and tactile cues to keep hips neutral  LAQ using 2 lb ankle weights x5 each leg Ambulated half lap around ortho gym with 2 lb ankle weights Self Care: Patient education on dry needling and ultrasound treatment   07/26/24 See HEP Pt educated on PT POC and goals, HEP, rationale for treatment                                                                                                                                 PATIENT EDUCATION:  Education details: See treatment  Person educated: Patient Education method: Explanation and Demonstration Education comprehension: verbalized understanding and returned demonstration  HOME EXERCISE PROGRAM: Access Code: [email or web address] URL: [email or web address] Date: 08/09/2024 Prepared by: Darice Conine Exercises - Supine March  - 1 x daily - 7 x weekly - 2 sets - 10 reps - Supine Bridge  - 1 x daily - 7 x weekly - 2 sets - 10 reps - Hooklying Clamshell with Resistance  - 1 x daily - 7 x weekly - 2 sets - 10 reps - Supine Hip Adduction Isometric with Ball  - 1 x daily - 7 x weekly - 2 sets - 10 reps - Standing Hip Abduction with Counter Support  - 1 x daily - 7 x weekly - 2 sets - 10 reps - Supine Lower Trunk Rotation  - 1 x daily - 7 x weekly - 2 sets - 10 reps   ASSESSMENT:  CLINICAL IMPRESSION: Pt has decreased pain since last session, monitoring her daily activities. She has improved LE strength, partially meeting her short term goal to achieve 4/5 MMT. Pt continues to present with left sided low back/hip pain, with manual therapy findings including tightness in QL muscle, responding positively to soft tissue mobilization and ice. Today's  focus targeted core and  glute muscle strength combined with coordination challenge. Pt tolerated session well, noting mild pain in low back toward end of session.     EVAL: Patient is a 73 y.o. female who was seen today for physical therapy evaluation and treatment for s/p lumbar fusion and Lt trochanteric bursitis. Pt presents with significant deficits in hip and core strength, impaired gait, balance and decreased functional activity tolerance. Pt will benefit from skilled PT to address deficits and improve functional mobility.   OBJECTIVE IMPAIRMENTS: decreased activity tolerance, decreased ROM, decreased strength, postural dysfunction, and pain.   ACTIVITY LIMITATIONS: lifting, bending, standing, and locomotion level  PARTICIPATION LIMITATIONS: shopping, community activity, and yard work  PERSONAL FACTORS: Past/current experiences, Time since onset of injury/illness/exacerbation, and 1-2 comorbidities: bursitis, neuromuscular disorder are also affecting patient's functional outcome.   REHAB POTENTIAL: Good  CLINICAL DECISION MAKING: Evolving/moderate complexity  EVALUATION COMPLEXITY: Moderate   GOALS: Goals reviewed with patient? Yes  SHORT TERM GOALS: Target date: 08/23/2024    Pt will be independent in initial HEP Baseline: Goal status: MET 08/23/24  2.  Pt will improve LE strength to 4/5 to improve standing and walking tolerance Baseline:  Goal status: Partially met 08/23/24    LONG TERM GOALS: Target date: 09/20/2024    Pt will be independent with advanced HEP Baseline:  Goal status: INITIAL  2.  Pt will improve modified oswestry to <= 7/50 to demo improved functional mobility Baseline:  Goal status: INITIAL  3.  Pt will tolerate walking x 30 minutes with pain <= 2/10 to complete shopping tasks Baseline:  Goal status: INITIAL  4.  Pt will be able to bend to pick up small item dropped on floor with pain <= 2/10 Baseline:  Goal status: INITIAL   PLAN:  PT FREQUENCY:  1-2x/week  PT DURATION: 8 weeks  PLANNED INTERVENTIONS: 97164- PT Re-evaluation, 97110-Therapeutic exercises, 97530- Therapeutic activity, 97112- Neuromuscular re-education, 97535- Self Care, [id number]- Manual therapy, [id number]- Electrical stimulation (unattended), 20560 (1-2 muscles), 20561 (3+ muscles)- Dry Needling, Patient/Family education, Balance training, Taping, Cryotherapy, and Moist heat.  PLAN FOR NEXT SESSION: Assess response to HEP, hip and core strength, check short term goals next visit, QL stretch, ice as needed,   Lavanda Cleverly, SPT 08/23/24 11:45 AM

## 2024-08-30 ENCOUNTER — Ambulatory Visit: Admitting: Physical Therapy

## 2024-09-06 ENCOUNTER — Ambulatory Visit (INDEPENDENT_AMBULATORY_CARE_PROVIDER_SITE_OTHER)

## 2024-09-06 ENCOUNTER — Encounter: Payer: Self-pay | Admitting: Physical Therapy

## 2024-09-06 ENCOUNTER — Ambulatory Visit: Admitting: Physical Therapy

## 2024-09-06 ENCOUNTER — Ambulatory Visit: Attending: Family Medicine | Admitting: Physical Therapy

## 2024-09-06 VITALS — BP 123/69 | HR 61 | Resp 20 | Ht 66.0 in | Wt 164.0 lb

## 2024-09-06 DIAGNOSIS — M5459 Other low back pain: Secondary | ICD-10-CM | POA: Insufficient documentation

## 2024-09-06 DIAGNOSIS — M6281 Muscle weakness (generalized): Secondary | ICD-10-CM | POA: Diagnosis present

## 2024-09-06 DIAGNOSIS — E538 Deficiency of other specified B group vitamins: Secondary | ICD-10-CM | POA: Diagnosis not present

## 2024-09-06 DIAGNOSIS — R42 Dizziness and giddiness: Secondary | ICD-10-CM

## 2024-09-06 NOTE — Progress Notes (Signed)
   Subjective:    Patient ID: Mia [name], female    DOB: 1951/09/10, 73 y.o.   MRN: [id number]  HPI  Patient is in office today for her B12 injection. Denies muscle,cramps, fever, weakness, irregular, or medication changes.          Review of Systems     Objective:           Assessment & Plan:   Pt given her B12 injection given in her RUOQ, Tolerated both well no redness or swelling noted at the site. Patient advised to RTC in 30 days (around 10/06/25) for next B12 injection.

## 2024-09-06 NOTE — Therapy (Addendum)
 OUTPATIENT PHYSICAL THERAPY THORACOLUMBAR TREATMENT    Patient Name: Mia [name] MRN: [id number] DOB:06-06-1951, 73 y.o., female Today's Date: 09/06/2024  END OF SESSION:  PT End of Session - 09/06/24 1027     Visit Number 5    Number of Visits 8    Date for Recertification  09/20/24    Authorization Type medicare    Authorization - Visit Number 7    Progress Note Due on Visit 10    PT Start Time 1027   Pt arrived late   PT Stop Time 1058    PT Time Calculation (min) 31 min    Activity Tolerance Patient tolerated treatment well    Behavior During Therapy [facility] for tasks assessed/performed              Past Medical History:  Diagnosis Date   Anal fissure    Anemia    Arthritis    Blood transfusion    C. difficile colitis    Chest pain    a. 01/2013 MV: EF 59%, no ischemia.   Chronic Dyspnea    a. 01/2013 Echo: EF 60-65%, Gr 1 DD, PASP 55mmHg.// Echo 01/2020: EF 60-65, no RWMA, GR 1 DD, GLS -21.6%, normal RV SF, trivial MR    COVID-19    DDD (degenerative disc disease), cervical    DDD (degenerative disc disease), lumbar    DVT (deep venous thrombosis) (HCC) 06/23/2023   left lower extremity   Fibromyalgia    Gall stones    GERD (gastroesophageal reflux disease)    gastritis   Hemorrhoids    Hypertension    Interstitial cystitis    MCI (mild cognitive impairment) 11/25/2017   Memory difficulty 12/14/2013   Neuromuscular disorder (HCC)    sclerosis   Osteoporosis    Peptic ulcer    PONV (postoperative nausea and vomiting)    Pseudogout    Raynaud phenomenon    Rectal bleeding    Sleep apnea    a. on cpap.   Stress fracture of left tibia 11/05/2016   SVT (supraventricular tachycardia)    Syncope 09/10/2015   Thyroid  disease    hypothyroidism   Tremor, essential 05/14/2016   Past Surgical History:  Procedure Laterality Date   APPENDECTOMY     BLADDER SURGERY     x2   BLADDER SURGERY     BREAST BIOPSY     CARDIAC CATHETERIZATION     CARPAL TUNNEL  RELEASE     CATARACT EXTRACTION Bilateral    CHOLECYSTECTOMY     DILATION AND CURETTAGE OF UTERUS     ENTEROCELE REPAIR     x2   EYE SURGERY     retina   herniated disc  11/25/2021   L4 and L5   hysterectomy - unknown type     JOINT REPLACEMENT     KNEE ARTHROPLASTY     KNEE SURGERY     x6   NECK SURGERY     fusion   OOPHORECTOMY     QUADRICEPS REPAIR Right    RECTOCELE REPAIR     x2   SHOULDER ARTHROSCOPY WITH SUBACROMIAL DECOMPRESSION, ROTATOR CUFF REPAIR AND BICEP TENDON REPAIR  10/06/2012   Procedure: SHOULDER ARTHROSCOPY WITH SUBACROMIAL DECOMPRESSION, ROTATOR CUFF REPAIR AND BICEP TENDON REPAIR;  Surgeon: Eva Elsie Herring, MD;  Location: [facility];  Service: Orthopedics;  Laterality: Right;  Arthroscopic  Repair  of  Subscapularis, Open Biceps Tenodesis   SHOULDER SURGERY     bilateral- bones  spur   SHOULDER SURGERY Left 04/15/2021   TONSILLECTOMY     TOTAL KNEE ARTHROPLASTY     TOTAL SHOULDER ARTHROPLASTY     TRANSFORAMINAL LUMBAR INTERBODY FUSION (TLIF) WITH PEDICLE SCREW FIXATION 1 LEVEL Left 04/20/2024   Procedure: TRANSFORAMINAL LUMBAR INTERBODY FUSION (TLIF) WITH PEDICLE SCREW FIXATION 1 LEVEL;  Surgeon: Beuford Anes, MD;  Location: [facility];  Service: Orthopedics;  Laterality: Left;  LEFT-SIDED LUMBAR 4 - LUMBAR 5 TRANSFORAMINAL LUMBAR INTERBODY FUSION AND DECOMPRESSION WITH INSTRUMENTATION AND ALLOGRAFT   Patient Active Problem List   Diagnosis Date Noted   Radiculopathy 04/20/2024   History of DVT of lower extremity 08/23/2023   Pain of right sacroiliac joint 10/15/2022   Palpitations 06/02/2022   Pernicious anemia 09/11/2020   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   Weakness of both lower extremities 06/11/2020   Hyponatremia 05/22/2020   Gait instability 06/20/2019   Facet arthritis of lumbar region 05/10/2018   MCI (mild cognitive impairment) 11/25/2017   Restrictive lung disease 07/30/2017   Mixed hyperlipidemia 06/18/2017   Rectocele  04/26/2017   Irritable bowel syndrome with both constipation and diarrhea 04/26/2017   Osteoporosis 04/18/2017   Trochanteric bursitis of both hips 02/24/2017   Inflammatory arthritis 09/30/2016   High risk medication use 09/30/2016   History of Clostridium difficile colitis 09/30/2016   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   Pseudogout 09/30/2016   DDD cervical spine status post fusion 09/30/2016   DDD thoracic spine 09/30/2016   H/O total knee replacement, right 09/30/2016   Age-related osteoporosis without current pathological fracture 09/30/2016   Tremor, essential 05/14/2016   Right foot pain 04/14/2016   B12 deficiency 09/10/2015   Cervical facet joint syndrome 09/10/2015   Chronic fatigue 09/10/2015   Aortic atherosclerosis 04/01/2015   DOE (dyspnea on exertion)    Primary osteoarthritis of right knee 08/13/2014   Benign head tremor 06/11/2014   Lumbar degenerative disc disease 06/11/2014   IFG (impaired fasting glucose) 03/09/2014   Hemorrhoid 03/09/2014   Retinal wrinkling, right eye 03/09/2014   Fibromyalgia 03/09/2014   GERD (gastroesophageal reflux disease) 03/09/2014   History of arthroplasty of right knee 01/31/2014   Hemorrhoids, external, thrombosed 06/22/2011   Hypertension 03/11/2011   SVT (supraventricular tachycardia)    Raynaud phenomenon    Nonspecific (abnormal) findings on radiological and other examination of body structure 08/25/2010   COMPUTERIZED TOMOGRAPHY, CHEST, ABNORMAL 08/25/2010   OSA (obstructive sleep apnea) 04/24/2009   Allergic rhinitis 06/11/2008   Dyspnea 06/11/2008   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   Chronic diastolic CHF (congestive heart failure) (HCC) 06/08/2008   INTERSTITIAL CYSTITIS 06/08/2008    PCP: Alvan  REFERRING PROVIDER: Beuford Anes  REFERRING DIAG: s/p Lumbar fusion, Lt trochanteric bursitis  Rationale for Evaluation and Treatment: Rehabilitation  THERAPY DIAG:  Muscle weakness  (generalized)  Other low back pain  Dizziness and giddiness  ONSET DATE: 04/20/24  SUBJECTIVE:  SUBJECTIVE STATEMENT:  Pt reports her vertigo has been going great however her bursitis in left hip and low back are hurting. Reports having to stay over night in the hospital with friend who had medical emergency.   EVAL: Pt is s/p L4/5 fusion on 04/20/24. She had been wearing back brace until 2 weeks ago and is now cleared for all activity. She states she has the most difficulty with bending and with walking longer than 20-30 minutes. She states she has pain in her mid back and Lt lower back. Pt has been using tylenol  and lidocaine  patches for pain control. Pt also has Lt trochanteric bursitis and had an injection 07/14/24.  PERTINENT HISTORY:  DDD, osteoporosis, vertigo  PAIN:  Are you having pain? Yes: NPRS scale: 4/10 Pain location: Lt hip, mid back  Pain description: sore, ache Aggravating factors: walking, bending Relieving factors: meds, pain patch  PRECAUTIONS: Fall and Other: per pt MD recommends no heavy lifting, osteoporosis  RED FLAGS: None   WEIGHT BEARING RESTRICTIONS: No  FALLS:  Has patient fallen in last 6 months? No   OCCUPATION: enjoys home projects, community activities  PLOF: Independent  PATIENT GOALS: decrease pain  NEXT MD VISIT: December 2025  OBJECTIVE:  Note: Objective measures were completed at Evaluation unless otherwise noted.  DIAGNOSTIC FINDINGS:  L4/5 fusion  PATIENT SURVEYS:  Modified Oswestry: 17/50   COGNITION: Overall cognitive status: Within functional limits for tasks assessed       POSTURE:  Low tone PALPATION: Mild increase in mm spasticity bilat lumbar paraspinals  LUMBAR ROM:   AROM eval  Flexion 70%  Extension To neutral  - pain   Right lateral flexion WFL - pain end range  Left lateral flexion WFL  Right rotation 50% - pain Lt side  Left rotation 70%   (Blank rows = not tested)  LOWER EXTREMITY ROM:    Grossly WFL bilat hips   LOWER EXTREMITY MMT:    MMT Right eval Left eval Right 08/23/24 Left   Left   Hip flexion 3- 3 3 3    Hip extension 3- 3 4 4    Hip abduction 3- 3 4- 4-   Hip adduction       Hip internal rotation       Hip external rotation 3- 3     Knee flexion       Knee extension       Ankle dorsiflexion       Ankle plantarflexion       Ankle inversion       Ankle eversion        (Blank rows = not tested)  LUMBAR SPECIAL TESTS:  Straight leg raise test: Negative    GAIT: Distance walked: 100' Assistive device utilized: None Level of assistance: Complete Independence Comments: wide BOS  TREATMENT DATE:  [facility] Adult PT Treatment:                                                DATE: 09/06/24 Therapeutic Exercise: Low trunk rotation x20  Piriformis stretch 30 sec each side seated  Prone knee flexion stretch 20 sec x2 each side  Tennis ball to left glute area for decreased tissue restriction 2 min  Hip abduction side lying x20 each side  Manual Therapy: STM to piriformis, glute region to decrease tissue restriction  Neuromuscular re-ed: [employer]  in supine Bridge + toe raise/heel drive Therapeutic Activity: Wall sits 20 sec - hurt her left knee    [facility] Adult PT Treatment:                                                DATE: 08/23/24 Therapeutic Exercise: Nustep level 3 for 5 min- SPT present to discuss pt status Low trunk rotation x20  Hamstring stretch left side 2x20 sec  Manual Therapy: STM to left QL- tightness restriction  Neuromuscular re-ed: Posterior pelvic tilt 2x10 Glute bridges 2x10 Pelvic tilt + heel drag on stability ball x20  Modalities: Ice pack to left QL for 10 min    [facility] Adult PT Treatment:                                                DATE:  08/16/24 Therapeutic Exercise: Hook lying clamshells with green band x30 Low trunk rotation x20  Seated QL stretch left side 2x20 sec  Manual Therapy: STM to left QL- moderate tissue restriction  Self tissue mobilization using tennis ball on QL muscle against wall for decreased tissue restriction  Neuromuscular re-ed: Posterior pelvic tilt in supine x20  Pelvic tilt + heel drag on stability ball x20  Modalities: Ice pack to left QL for 10 min       PATIENT EDUCATION:  Education details: See treatment  Person educated: Patient Education method: Explanation and Demonstration Education comprehension: verbalized understanding and returned demonstration  HOME EXERCISE PROGRAM: Access Code: [email or web address] URL: [email or web address] Date: 08/09/2024 Prepared by: Darice Conine Exercises - Supine March  - 1 x daily - 7 x weekly - 2 sets - 10 reps - Supine Bridge  - 1 x daily - 7 x weekly - 2 sets - 10 reps - Hooklying Clamshell with Resistance  - 1 x daily - 7 x weekly - 2 sets - 10 reps - Supine Hip Adduction Isometric with Ball  - 1 x daily - 7 x weekly - 2 sets - 10 reps - Standing Hip Abduction with Counter Support  - 1 x daily - 7 x weekly - 2 sets - 10 reps - Supine Lower Trunk Rotation  - 1 x daily - 7 x weekly - 2 sets - 10 reps   ASSESSMENT:  CLINICAL IMPRESSION: Pt asked about getting shot for left hip bursitits as it has flared up again recently due to staying over night in hospital, taking care of loved ones. Recommended she continue hip muscle strengthening with PT and follow up with doctor regarding shot. Pt arrived late. Focused on hip muscle strengthening, introduced bridges and bridges with toe raise to promote glute activation. Modified side lying hip abd kicks when lying on left side to bend left knee while right leg lifts secondary to pt reporting left hip pain. Reported pain initially at start of bridges but dissipated as exercise continued. Discussed pt's quad  muscle weakness and her long term history with surgeries on bilateral quads. Discontinued wall sits due to reported left knee pain. Will continue to progress as able.      EVAL: Patient is a 73 y.o. female who was seen today for physical therapy evaluation and treatment for s/p lumbar fusion and Lt  trochanteric bursitis. Pt presents with significant deficits in hip and core strength, impaired gait, balance and decreased functional activity tolerance. Pt will benefit from skilled PT to address deficits and improve functional mobility.   OBJECTIVE IMPAIRMENTS: decreased activity tolerance, decreased ROM, decreased strength, postural dysfunction, and pain.   ACTIVITY LIMITATIONS: lifting, bending, standing, and locomotion level  PARTICIPATION LIMITATIONS: shopping, community activity, and yard work  PERSONAL FACTORS: Past/current experiences, Time since onset of injury/illness/exacerbation, and 1-2 comorbidities: bursitis, neuromuscular disorder are also affecting patient's functional outcome.   REHAB POTENTIAL: Good  CLINICAL DECISION MAKING: Evolving/moderate complexity  EVALUATION COMPLEXITY: Moderate   GOALS: Goals reviewed with patient? Yes  SHORT TERM GOALS: Target date: 08/23/2024    Pt will be independent in initial HEP Baseline: Goal status: MET 08/23/24  2.  Pt will improve LE strength to 4/5 to improve standing and walking tolerance Baseline:  Goal status: Partially met 08/23/24    LONG TERM GOALS: Target date: 09/20/2024    Pt will be independent with advanced HEP Baseline:  Goal status: INITIAL  2.  Pt will improve modified oswestry to <= 7/50 to demo improved functional mobility Baseline:  Goal status: INITIAL  3.  Pt will tolerate walking x 30 minutes with pain <= 2/10 to complete shopping tasks Baseline:  Goal status: INITIAL  4.  Pt will be able to bend to pick up small item dropped on floor with pain <= 2/10 Baseline:  Goal status:  INITIAL   PLAN:  PT FREQUENCY: 1-2x/week  PT DURATION: 8 weeks  PLANNED INTERVENTIONS: 97164- PT Re-evaluation, 97110-Therapeutic exercises, 97530- Therapeutic activity, 97112- Neuromuscular re-education, 97535- Self Care, [id number]- Manual therapy, [id number]- Electrical stimulation (unattended), 20560 (1-2 muscles), 20561 (3+ muscles)- Dry Needling, Patient/Family education, Balance training, Taping, Cryotherapy, and Moist heat.  PLAN FOR NEXT SESSION: Assess response to HEP, hip and core strength, check short term goals next visit with hip strength QL stretch, ice as needed  Lavanda Cleverly, SPT 09/06/24 4:09 PM

## 2024-09-07 DIAGNOSIS — M1712 Unilateral primary osteoarthritis, left knee: Secondary | ICD-10-CM | POA: Diagnosis not present

## 2024-09-07 DIAGNOSIS — M25562 Pain in left knee: Secondary | ICD-10-CM | POA: Diagnosis not present

## 2024-09-11 ENCOUNTER — Encounter: Payer: Self-pay | Admitting: Family Medicine

## 2024-09-11 ENCOUNTER — Other Ambulatory Visit: Payer: Self-pay | Admitting: Physician Assistant

## 2024-09-11 ENCOUNTER — Ambulatory Visit: Payer: Self-pay | Admitting: Family Medicine

## 2024-09-11 DIAGNOSIS — G4733 Obstructive sleep apnea (adult) (pediatric): Secondary | ICD-10-CM

## 2024-09-11 DIAGNOSIS — Z79899 Other long term (current) drug therapy: Secondary | ICD-10-CM

## 2024-09-11 MED ORDER — AMBULATORY NON FORMULARY MEDICATION: 0 refills | Status: DC

## 2024-09-11 NOTE — Progress Notes (Signed)
 Hi Samentha, wanted to let you know that the sleep report looks better set to 14 pressure.  You are still having a few more events and I would like but it does look better how are you feeling on the increased pressure are you noticing a difference.

## 2024-09-11 NOTE — Telephone Encounter (Signed)
 Printed new order please fax to DME supplier for her CPAP.

## 2024-09-11 NOTE — Telephone Encounter (Signed)
 Last Fill: 05/25/2024  Labs: 04/12/2024 MCV 100.2, Glucose 118, BUN 5  Next Visit: 11/01/2024  Last Visit: 05/25/2024  DX:  Seronegative rheumatoid arthritis    Current Dose per office note 05/25/2024: Arava  10 mg and 20 mg every other day.   Patient advised she is due to update her lab work. Patient states she will come tomorrow.   Okay to refill Arava  ?

## 2024-09-11 NOTE — Progress Notes (Signed)
 Attempted call to patient. Left a voice mail message requesting a return call.

## 2024-09-12 ENCOUNTER — Encounter: Payer: Self-pay | Admitting: Family Medicine

## 2024-09-12 ENCOUNTER — Other Ambulatory Visit: Payer: Self-pay | Admitting: *Deleted

## 2024-09-12 DIAGNOSIS — Z79899 Other long term (current) drug therapy: Secondary | ICD-10-CM

## 2024-09-12 NOTE — Therapy (Deleted)
 OUTPATIENT PHYSICAL THERAPY THORACOLUMBAR TREATMENT    Patient Name: Mia [name] MRN: [id number] DOB:04/12/1951, 73 y.o., female Today's Date: 09/12/2024  END OF SESSION:        Past Medical History:  Diagnosis Date   Anal fissure    Anemia    Arthritis    Blood transfusion    C. difficile colitis    Chest pain    a. 01/2013 MV: EF 59%, no ischemia.   Chronic Dyspnea    a. 01/2013 Echo: EF 60-65%, Gr 1 DD, PASP 55mmHg.// Echo 01/2020: EF 60-65, no RWMA, GR 1 DD, GLS -21.6%, normal RV SF, trivial MR    COVID-19    DDD (degenerative disc disease), cervical    DDD (degenerative disc disease), lumbar    DVT (deep venous thrombosis) (HCC) 06/23/2023   left lower extremity   Fibromyalgia    Gall stones    GERD (gastroesophageal reflux disease)    gastritis   Hemorrhoids    Hypertension    Interstitial cystitis    MCI (mild cognitive impairment) 11/25/2017   Memory difficulty 12/14/2013   Neuromuscular disorder (HCC)    sclerosis   Osteoporosis    Peptic ulcer    PONV (postoperative nausea and vomiting)    Pseudogout    Raynaud phenomenon    Rectal bleeding    Sleep apnea    a. on cpap.   Stress fracture of left tibia 11/05/2016   SVT (supraventricular tachycardia)    Syncope 09/10/2015   Thyroid  disease    hypothyroidism   Tremor, essential 05/14/2016   Past Surgical History:  Procedure Laterality Date   APPENDECTOMY     BLADDER SURGERY     x2   BLADDER SURGERY     BREAST BIOPSY     CARDIAC CATHETERIZATION     CARPAL TUNNEL RELEASE     CATARACT EXTRACTION Bilateral    CHOLECYSTECTOMY     DILATION AND CURETTAGE OF UTERUS     ENTEROCELE REPAIR     x2   EYE SURGERY     retina   herniated disc  11/25/2021   L4 and L5   hysterectomy - unknown type     JOINT REPLACEMENT     KNEE ARTHROPLASTY     KNEE SURGERY     x6   NECK SURGERY     fusion   OOPHORECTOMY     QUADRICEPS REPAIR Right    RECTOCELE REPAIR     x2   SHOULDER ARTHROSCOPY WITH  SUBACROMIAL DECOMPRESSION, ROTATOR CUFF REPAIR AND BICEP TENDON REPAIR  10/06/2012   Procedure: SHOULDER ARTHROSCOPY WITH SUBACROMIAL DECOMPRESSION, ROTATOR CUFF REPAIR AND BICEP TENDON REPAIR;  Surgeon: Eva Elsie Herring, MD;  Location: [facility];  Service: Orthopedics;  Laterality: Right;  Arthroscopic  Repair  of  Subscapularis, Open Biceps Tenodesis   SHOULDER SURGERY     bilateral- bones spur   SHOULDER SURGERY Left 04/15/2021   TONSILLECTOMY     TOTAL KNEE ARTHROPLASTY     TOTAL SHOULDER ARTHROPLASTY     TRANSFORAMINAL LUMBAR INTERBODY FUSION (TLIF) WITH PEDICLE SCREW FIXATION 1 LEVEL Left 04/20/2024   Procedure: TRANSFORAMINAL LUMBAR INTERBODY FUSION (TLIF) WITH PEDICLE SCREW FIXATION 1 LEVEL;  Surgeon: Beuford Anes, MD;  Location: [facility];  Service: Orthopedics;  Laterality: Left;  LEFT-SIDED LUMBAR 4 - LUMBAR 5 TRANSFORAMINAL LUMBAR INTERBODY FUSION AND DECOMPRESSION WITH INSTRUMENTATION AND ALLOGRAFT   Patient Active Problem List   Diagnosis Date Noted   Radiculopathy 04/20/2024   History  of DVT of lower extremity 08/23/2023   Pain of right sacroiliac joint 10/15/2022   Palpitations 06/02/2022   Pernicious anemia 09/11/2020   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   Weakness of both lower extremities 06/11/2020   Hyponatremia 05/22/2020   Gait instability 06/20/2019   Facet arthritis of lumbar region 05/10/2018   MCI (mild cognitive impairment) 11/25/2017   Restrictive lung disease 07/30/2017   Mixed hyperlipidemia 06/18/2017   Rectocele 04/26/2017   Irritable bowel syndrome with both constipation and diarrhea 04/26/2017   Osteoporosis 04/18/2017   Trochanteric bursitis of both hips 02/24/2017   Inflammatory arthritis 09/30/2016   High risk medication use 09/30/2016   History of Clostridium difficile colitis 09/30/2016   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   Pseudogout 09/30/2016   DDD cervical spine status post fusion 09/30/2016   DDD thoracic spine  09/30/2016   H/O total knee replacement, right 09/30/2016   Age-related osteoporosis without current pathological fracture 09/30/2016   Tremor, essential 05/14/2016   Right foot pain 04/14/2016   B12 deficiency 09/10/2015   Cervical facet joint syndrome 09/10/2015   Chronic fatigue 09/10/2015   Aortic atherosclerosis 04/01/2015   DOE (dyspnea on exertion)    Primary osteoarthritis of right knee 08/13/2014   Benign head tremor 06/11/2014   Lumbar degenerative disc disease 06/11/2014   IFG (impaired fasting glucose) 03/09/2014   Hemorrhoid 03/09/2014   Retinal wrinkling, right eye 03/09/2014   Fibromyalgia 03/09/2014   GERD (gastroesophageal reflux disease) 03/09/2014   History of arthroplasty of right knee 01/31/2014   Hemorrhoids, external, thrombosed 06/22/2011   Hypertension 03/11/2011   SVT (supraventricular tachycardia)    Raynaud phenomenon    Nonspecific (abnormal) findings on radiological and other examination of body structure 08/25/2010   COMPUTERIZED TOMOGRAPHY, CHEST, ABNORMAL 08/25/2010   OSA (obstructive sleep apnea) 04/24/2009   Allergic rhinitis 06/11/2008   Dyspnea 06/11/2008   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   Chronic diastolic CHF (congestive heart failure) (HCC) 06/08/2008   INTERSTITIAL CYSTITIS 06/08/2008    PCP: Al[facility]  REFERRING PROVIDER: Beuford Anes  REFERRING DIAG: s/p Lumbar fusion, Lt trochanteric bursitis  Rationale for Evaluation and Treatment: Rehabilitation  THERAPY DIAG:  No diagnosis found.  ONSET DATE: 04/20/24  SUBJECTIVE:                                                                                                                                                                                           SUBJECTIVE STATEMENT:  Pt reports her vertigo has been going great however her bursitis in left hip and low back are hurting. Reports having to stay over night in the hospital with friend who had medical  emergency.   EVAL: Pt is s/p L4/5 fusion on 04/20/24. She had been wearing back brace until 2 weeks ago and is now cleared for all activity. She states she has the most difficulty with bending and with walking longer than 20-30 minutes. She states she has pain in her mid back and Lt lower back. Pt has been using tylenol  and lidocaine  patches for pain control. Pt also has Lt trochanteric bursitis and had an injection 07/14/24.  PERTINENT HISTORY:  DDD, osteoporosis, vertigo  PAIN:  Are you having pain? Yes: NPRS scale: 4/10 Pain location: Lt hip, mid back  Pain description: sore, ache Aggravating factors: walking, bending Relieving factors: meds, pain patch  PRECAUTIONS: Fall and Other: per pt MD recommends no heavy lifting, osteoporosis  RED FLAGS: None   WEIGHT BEARING RESTRICTIONS: No  FALLS:  Has patient fallen in last 6 months? No   OCCUPATION: enjoys home projects, community activities  PLOF: Independent  PATIENT GOALS: decrease pain  NEXT MD VISIT: December 2025  OBJECTIVE:  Note: Objective measures were completed at Evaluation unless otherwise noted.  DIAGNOSTIC FINDINGS:  L4/5 fusion  PATIENT SURVEYS:  Modified Oswestry: 17/50   COGNITION: Overall cognitive status: Within functional limits for tasks assessed       POSTURE:  Low tone PALPATION: Mild increase in mm spasticity bilat lumbar paraspinals  LUMBAR ROM:   AROM eval  Flexion 70%  Extension To neutral  - pain  Right lateral flexion WFL - pain end range  Left lateral flexion WFL  Right rotation 50% - pain Lt side  Left rotation 70%   (Blank rows = not tested)  LOWER EXTREMITY ROM:    Grossly WFL bilat hips   LOWER EXTREMITY MMT:    MMT Right eval Left eval Right 08/23/24 Left   Left   Hip flexion 3- 3 3 3    Hip extension 3- 3 4 4    Hip abduction 3- 3 4- 4-   Hip adduction       Hip internal rotation       Hip external rotation 3- 3     Knee flexion       Knee extension        Ankle dorsiflexion       Ankle plantarflexion       Ankle inversion       Ankle eversion        (Blank rows = not tested)  LUMBAR SPECIAL TESTS:  Straight leg raise test: Negative    GAIT: Distance walked: 100' Assistive device utilized: None Level of assistance: Complete Independence Comments: wide BOS  TREATMENT DATE:  [facility] Adult PT Treatment:                                                DATE: 09/06/24 Therapeutic Exercise: Low trunk rotation x20  Piriformis stretch 30 sec each side seated  Prone knee flexion stretch 20 sec x2 each side  Tennis ball to left glute area for decreased tissue restriction 2 min  Hip abduction side lying x20 each side  Manual Therapy: STM to piriformis, glute region to decrease tissue restriction  Neuromuscular re-ed: Bridge in supine Bridge + toe raise/heel drive Therapeutic Activity: Wall sits 20 sec - hurt her left knee    [facility] Adult PT Treatment:  DATE: 08/23/24 Therapeutic Exercise: Nustep level 3 for 5 min- SPT present to discuss pt status Low trunk rotation x20  Hamstring stretch left side 2x20 sec  Manual Therapy: STM to left QL- tightness restriction  Neuromuscular re-ed: Posterior pelvic tilt 2x10 Glute bridges 2x10 Pelvic tilt + heel drag on stability ball x20  Modalities: Ice pack to left QL for 10 min    [facility] Adult PT Treatment:                                                DATE: 08/16/24 Therapeutic Exercise: Hook lying clamshells with green band x30 Low trunk rotation x20  Seated QL stretch left side 2x20 sec  Manual Therapy: STM to left QL- moderate tissue restriction  Self tissue mobilization using tennis ball on QL muscle against wall for decreased tissue restriction  Neuromuscular re-ed: Posterior pelvic tilt in supine x20  Pelvic tilt + heel drag on stability ball x20  Modalities: Ice pack to left QL for 10 min       PATIENT EDUCATION:  Education  details: See treatment  Person educated: Patient Education method: Explanation and Demonstration Education comprehension: verbalized understanding and returned demonstration  HOME EXERCISE PROGRAM: Access Code: [email or web address] URL: [email or web address] Date: 08/09/2024 Prepared by: Darice Conine Exercises - Supine March  - 1 x daily - 7 x weekly - 2 sets - 10 reps - Supine Bridge  - 1 x daily - 7 x weekly - 2 sets - 10 reps - Hooklying Clamshell with Resistance  - 1 x daily - 7 x weekly - 2 sets - 10 reps - Supine Hip Adduction Isometric with Ball  - 1 x daily - 7 x weekly - 2 sets - 10 reps - Standing Hip Abduction with Counter Support  - 1 x daily - 7 x weekly - 2 sets - 10 reps - Supine Lower Trunk Rotation  - 1 x daily - 7 x weekly - 2 sets - 10 reps   ASSESSMENT:  CLINICAL IMPRESSION: Pt asked about getting shot for left hip bursitits as it has flared up again recently due to staying over night in hospital, taking care of loved ones. Recommended she continue hip muscle strengthening with PT and follow up with doctor regarding shot. Pt arrived late. Focused on hip muscle strengthening, introduced bridges and bridges with toe raise to promote glute activation. Modified side lying hip abd kicks when lying on left side to bend left knee while right leg lifts secondary to pt reporting left hip pain. Reported pain initially at start of bridges but dissipated as exercise continued. Discussed pt's quad muscle weakness and her long term history with surgeries on bilateral quads. Discontinued wall sits due to reported left knee pain. Will continue to progress as able.      EVAL: Patient is a 73 y.o. female who was seen today for physical therapy evaluation and treatment for s/p lumbar fusion and Lt trochanteric bursitis. Pt presents with significant deficits in hip and core strength, impaired gait, balance and decreased functional activity tolerance. Pt will benefit from skilled PT to address  deficits and improve functional mobility.   OBJECTIVE IMPAIRMENTS: decreased activity tolerance, decreased ROM, decreased strength, postural dysfunction, and pain.   ACTIVITY LIMITATIONS: lifting, bending, standing, and locomotion level  PARTICIPATION LIMITATIONS: shopping, community activity, and yard work  PERSONAL FACTORS: Past/current experiences,  Time since onset of injury/illness/exacerbation, and 1-2 comorbidities: bursitis, neuromuscular disorder are also affecting patient's functional outcome.   REHAB POTENTIAL: Good  CLINICAL DECISION MAKING: Evolving/moderate complexity  EVALUATION COMPLEXITY: Moderate   GOALS: Goals reviewed with patient? Yes  SHORT TERM GOALS: Target date: 08/23/2024    Pt will be independent in initial HEP Baseline: Goal status: MET 08/23/24  2.  Pt will improve LE strength to 4/5 to improve standing and walking tolerance Baseline:  Goal status: Partially met 08/23/24    LONG TERM GOALS: Target date: 09/20/2024    Pt will be independent with advanced HEP Baseline:  Goal status: INITIAL  2.  Pt will improve modified oswestry to <= 7/50 to demo improved functional mobility Baseline:  Goal status: INITIAL  3.  Pt will tolerate walking x 30 minutes with pain <= 2/10 to complete shopping tasks Baseline:  Goal status: INITIAL  4.  Pt will be able to bend to pick up small item dropped on floor with pain <= 2/10 Baseline:  Goal status: INITIAL   PLAN:  PT FREQUENCY: 1-2x/week  PT DURATION: 8 weeks  PLANNED INTERVENTIONS: 97164- PT Re-evaluation, 97110-Therapeutic exercises, 97530- Therapeutic activity, 97112- Neuromuscular re-education, 97535- Self Care, [id number]- Manual therapy, [id number]- Electrical stimulation (unattended), 20560 (1-2 muscles), 20561 (3+ muscles)- Dry Needling, Patient/Family education, Balance training, Taping, Cryotherapy, and Moist heat.  PLAN FOR NEXT SESSION: Assess response to HEP, hip and core strength, check  short term goals next visit with hip strength QL stretch, ice as needed  Lavanda Cleverly, SPT 09/12/24 4:27 PM

## 2024-09-13 ENCOUNTER — Encounter: Payer: Self-pay | Admitting: Family Medicine

## 2024-09-13 ENCOUNTER — Ambulatory Visit: Admitting: Physical Therapy

## 2024-09-13 ENCOUNTER — Ambulatory Visit: Payer: Self-pay | Admitting: Physician Assistant

## 2024-09-13 LAB — CBC WITH DIFFERENTIAL/PLATELET
Absolute Lymphocytes: 1500 {cells}/uL (ref 850–3900)
Absolute Monocytes: 552 {cells}/uL (ref 200–950)
Basophils Absolute: 72 {cells}/uL (ref 0–200)
Basophils Relative: 1.2 %
Eosinophils Absolute: 162 {cells}/uL (ref 15–500)
Eosinophils Relative: 2.7 %
HCT: 38.8 % (ref 35.0–45.0)
Hemoglobin: 12.5 g/dL (ref 11.7–15.5)
MCH: 30.9 pg (ref 27.0–33.0)
MCHC: 32.2 g/dL (ref 32.0–36.0)
MCV: 96 fL (ref 80.0–100.0)
MPV: 10.7 fL (ref 7.5–12.5)
Monocytes Relative: 9.2 %
Neutro Abs: 3714 {cells}/uL (ref 1500–7800)
Neutrophils Relative %: 61.9 %
Platelets: 290 Thousand/uL (ref 140–400)
RBC: 4.04 Million/uL (ref 3.80–5.10)
RDW: 12.3 % (ref 11.0–15.0)
Total Lymphocyte: 25 %
WBC: 6 Thousand/uL (ref 3.8–10.8)

## 2024-09-13 LAB — COMPREHENSIVE METABOLIC PANEL WITH GFR
AG Ratio: 1.7 (calc) (ref 1.0–2.5)
ALT: 48 U/L — ABNORMAL HIGH (ref 6–29)
AST: 45 U/L — ABNORMAL HIGH (ref 10–35)
Albumin: 4.1 g/dL (ref 3.6–5.1)
Alkaline phosphatase (APISO): 94 U/L (ref 37–153)
BUN: 10 mg/dL (ref 7–25)
CO2: 28 mmol/L (ref 20–32)
Calcium: 9.7 mg/dL (ref 8.6–10.4)
Chloride: 101 mmol/L (ref 98–110)
Creat: 0.64 mg/dL (ref 0.60–1.00)
Globulin: 2.4 g/dL (ref 1.9–3.7)
Glucose, Bld: 149 mg/dL — ABNORMAL HIGH (ref 65–99)
Potassium: 4.3 mmol/L (ref 3.5–5.3)
Sodium: 137 mmol/L (ref 135–146)
Total Bilirubin: 0.4 mg/dL (ref 0.2–1.2)
Total Protein: 6.5 g/dL (ref 6.1–8.1)
eGFR: 93 mL/min/1.73m2 (ref >=60)

## 2024-09-13 NOTE — Telephone Encounter (Signed)
 It was printed on 11/17 so not sure if it was sent or not

## 2024-09-13 NOTE — Telephone Encounter (Signed)
 Faxed new orders for change of setting for CPAP to 16  Faxed to [facility] at fax# [phone]

## 2024-09-13 NOTE — Progress Notes (Signed)
 Glucose is elevated-149.  AST and ALT remain elevated but have improved.  CBC WNL No change in therapy at this time.

## 2024-09-29 ENCOUNTER — Encounter: Payer: Self-pay | Admitting: Physical Therapy

## 2024-09-29 ENCOUNTER — Ambulatory Visit: Attending: Family Medicine | Admitting: Physical Therapy

## 2024-09-29 DIAGNOSIS — M5459 Other low back pain: Secondary | ICD-10-CM | POA: Diagnosis present

## 2024-09-29 DIAGNOSIS — M6281 Muscle weakness (generalized): Secondary | ICD-10-CM | POA: Diagnosis present

## 2024-09-29 NOTE — Therapy (Addendum)
 OUTPATIENT PHYSICAL THERAPY THORACOLUMBAR TREATMENT AND RECERTIFICATION   Patient Name: Mia [name] MRN: [id number] DOB:Aug 02, 1951, 73 y.o., female Today's Date: 09/29/2024  END OF SESSION:  PT End of Session - 09/29/24 0949     Visit Number 6    Number of Visits 10    Date for Recertification  10/27/24    Authorization Type medicare    Authorization - Visit Number 8    Progress Note Due on Visit 10    PT Start Time [phone]   pt arrived late   PT Stop Time 1029    PT Time Calculation (min) 42 min    Activity Tolerance Patient tolerated treatment well    Behavior During Therapy [facility] for tasks assessed/performed               Past Medical History:  Diagnosis Date   Anal fissure    Anemia    Arthritis    Blood transfusion    C. difficile colitis    Chest pain    a. 01/2013 MV: EF 59%, no ischemia.   Chronic Dyspnea    a. 01/2013 Echo: EF 60-65%, Gr 1 DD, PASP 27mmHg.// Echo 01/2020: EF 60-65, no RWMA, GR 1 DD, GLS -21.6%, normal RV SF, trivial MR    COVID-19    DDD (degenerative disc disease), cervical    DDD (degenerative disc disease), lumbar    DVT (deep venous thrombosis) (HCC) 06/23/2023   left lower extremity   Fibromyalgia    Gall stones    GERD (gastroesophageal reflux disease)    gastritis   Hemorrhoids    Hypertension    Interstitial cystitis    MCI (mild cognitive impairment) 11/25/2017   Memory difficulty 12/14/2013   Neuromuscular disorder (HCC)    sclerosis   Osteoporosis    Peptic ulcer    PONV (postoperative nausea and vomiting)    Pseudogout    Raynaud phenomenon    Rectal bleeding    Sleep apnea    a. on cpap.   Stress fracture of left tibia 11/05/2016   SVT (supraventricular tachycardia)    Syncope 09/10/2015   Thyroid  disease    hypothyroidism   Tremor, essential 05/14/2016   Past Surgical History:  Procedure Laterality Date   APPENDECTOMY     BLADDER SURGERY     x2   BLADDER SURGERY     BREAST BIOPSY     CARDIAC  CATHETERIZATION     CARPAL TUNNEL RELEASE     CATARACT EXTRACTION Bilateral    CHOLECYSTECTOMY     DILATION AND CURETTAGE OF UTERUS     ENTEROCELE REPAIR     x2   EYE SURGERY     retina   herniated disc  11/25/2021   L4 and L5   hysterectomy - unknown type     JOINT REPLACEMENT     KNEE ARTHROPLASTY     KNEE SURGERY     x6   NECK SURGERY     fusion   OOPHORECTOMY     QUADRICEPS REPAIR Right    RECTOCELE REPAIR     x2   SHOULDER ARTHROSCOPY WITH SUBACROMIAL DECOMPRESSION, ROTATOR CUFF REPAIR AND BICEP TENDON REPAIR  10/06/2012   Procedure: SHOULDER ARTHROSCOPY WITH SUBACROMIAL DECOMPRESSION, ROTATOR CUFF REPAIR AND BICEP TENDON REPAIR;  Surgeon: Eva Elsie Herring, MD;  Location: [facility];  Service: Orthopedics;  Laterality: Right;  Arthroscopic  Repair  of  Subscapularis, Open Biceps Tenodesis   SHOULDER SURGERY  bilateral- bones spur   SHOULDER SURGERY Left 04/15/2021   TONSILLECTOMY     TOTAL KNEE ARTHROPLASTY     TOTAL SHOULDER ARTHROPLASTY     TRANSFORAMINAL LUMBAR INTERBODY FUSION (TLIF) WITH PEDICLE SCREW FIXATION 1 LEVEL Left 04/20/2024   Procedure: TRANSFORAMINAL LUMBAR INTERBODY FUSION (TLIF) WITH PEDICLE SCREW FIXATION 1 LEVEL;  Surgeon: Beuford Anes, MD;  Location: [facility] OR;  Service: Orthopedics;  Laterality: Left;  LEFT-SIDED LUMBAR 4 - LUMBAR 5 TRANSFORAMINAL LUMBAR INTERBODY FUSION AND DECOMPRESSION WITH INSTRUMENTATION AND ALLOGRAFT   Patient Active Problem List   Diagnosis Date Noted   Radiculopathy 04/20/2024   History of DVT of lower extremity 08/23/2023   Pain of right sacroiliac joint 10/15/2022   Palpitations 06/02/2022   Pernicious anemia 09/11/2020   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   Weakness of both lower extremities 06/11/2020   Hyponatremia 05/22/2020   Gait instability 06/20/2019   Facet arthritis of lumbar region 05/10/2018   MCI (mild cognitive impairment) 11/25/2017   Restrictive lung disease 07/30/2017   Mixed  hyperlipidemia 06/18/2017   Rectocele 04/26/2017   Irritable bowel syndrome with both constipation and diarrhea 04/26/2017   Osteoporosis 04/18/2017   Trochanteric bursitis of both hips 02/24/2017   Inflammatory arthritis 09/30/2016   High risk medication use 09/30/2016   History of Clostridium difficile colitis 09/30/2016   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   Pseudogout 09/30/2016   DDD cervical spine status post fusion 09/30/2016   DDD thoracic spine 09/30/2016   H/O total knee replacement, right 09/30/2016   Age-related osteoporosis without current pathological fracture 09/30/2016   Tremor, essential 05/14/2016   Right foot pain 04/14/2016   B12 deficiency 09/10/2015   Cervical facet joint syndrome 09/10/2015   Chronic fatigue 09/10/2015   Aortic atherosclerosis 04/01/2015   DOE (dyspnea on exertion)    Primary osteoarthritis of right knee 08/13/2014   Benign head tremor 06/11/2014   Lumbar degenerative disc disease 06/11/2014   IFG (impaired fasting glucose) 03/09/2014   Hemorrhoid 03/09/2014   Retinal wrinkling, right eye 03/09/2014   Fibromyalgia 03/09/2014   GERD (gastroesophageal reflux disease) 03/09/2014   History of arthroplasty of right knee 01/31/2014   Hemorrhoids, external, thrombosed 06/22/2011   Hypertension 03/11/2011   SVT (supraventricular tachycardia)    Raynaud phenomenon    Nonspecific (abnormal) findings on radiological and other examination of body structure 08/25/2010   COMPUTERIZED TOMOGRAPHY, CHEST, ABNORMAL 08/25/2010   OSA (obstructive sleep apnea) 04/24/2009   Allergic rhinitis 06/11/2008   Dyspnea 06/11/2008   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   Chronic diastolic CHF (congestive heart failure) (HCC) 06/08/2008   INTERSTITIAL CYSTITIS 06/08/2008    PCP: Al[facility]  REFERRING PROVIDER: Beuford Anes  REFERRING DIAG: s/p Lumbar fusion, Lt trochanteric bursitis  Rationale for Evaluation and Treatment:  Rehabilitation  THERAPY DIAG:  Muscle weakness (generalized)  ONSET DATE: 04/20/24  SUBJECTIVE:  SUBJECTIVE STATEMENT:  Pt arrived late. States she is disappointed we didn't get snow. States her hip only hurts when she does activity. She says she is learning to listen to her body, and is doing better.   EVAL: Pt is s/p L4/5 fusion on 04/20/24. She had been wearing back brace until 2 weeks ago and is now cleared for all activity. She states she has the most difficulty with bending and with walking longer than 20-30 minutes. She states she has pain in her mid back and Lt lower back. Pt has been using tylenol  and lidocaine  patches for pain control. Pt also has Lt trochanteric bursitis and had an injection 07/14/24.  PERTINENT HISTORY:  DDD, osteoporosis, vertigo  PAIN:  Are you having pain? Yes: NPRS scale: 2/10 Pain location: Lt hip, mid back  Pain description: sore, ache Aggravating factors: walking, bending Relieving factors: meds, pain patch  PRECAUTIONS: Fall and Other: per pt MD recommends no heavy lifting, osteoporosis  RED FLAGS: None   WEIGHT BEARING RESTRICTIONS: No  FALLS:  Has patient fallen in last 6 months? No   OCCUPATION: enjoys home projects, community activities  PLOF: Independent  PATIENT GOALS: decrease pain  NEXT MD VISIT: December 2025  OBJECTIVE:  Note: Objective measures were completed at Evaluation unless otherwise noted.  DIAGNOSTIC FINDINGS:  L4/5 fusion  PATIENT SURVEYS:  Modified Oswestry: 17/50   COGNITION: Overall cognitive status: Within functional limits for tasks assessed       POSTURE:  Low tone PALPATION: Mild increase in mm spasticity bilat lumbar paraspinals  LUMBAR ROM:   AROM eval  Flexion 70%  Extension To neutral  - pain  Right  lateral flexion WFL - pain end range  Left lateral flexion WFL  Right rotation 50% - pain Lt side  Left rotation 70%   (Blank rows = not tested)  LOWER EXTREMITY ROM:    Grossly WFL bilat hips   LOWER EXTREMITY MMT:    MMT Right eval Left eval Right 08/23/24 Left   Left   Hip flexion 3- 3 3 3    Hip extension 3- 3 4 4    Hip abduction 3- 3 4- 4-   Hip adduction       Hip internal rotation       Hip external rotation 3- 3     Knee flexion       Knee extension       Ankle dorsiflexion       Ankle plantarflexion       Ankle inversion       Ankle eversion        (Blank rows = not tested)  LUMBAR SPECIAL TESTS:  Straight leg raise test: Negative    GAIT: Distance walked: 100' Assistive device utilized: None Level of assistance: Complete Independence Comments: wide BOS  TREATMENT DATE:  [facility] Adult PT Treatment:                                                DATE: 09/29/24 Therapeutic Exercise: Low trunk rotation x20  Hip abduction side lying x20 laying on right side only secondary to pain when lying on right side  Piriformis stretch in seated 20 sec x2 Hamstring stretch 20 sec left side  HEP review  Manual Therapy: STM to left QL and paraspinals -tissue restriction found  Neuromuscular re-ed: Bridge in supine x10  Bridge + toe raise/heel drive k89 Therapeutic Activity: Standing hip flexion x10 (marches)  Standing hip abd x10 each side  Standing hip hamstring curls x10    [facility] Adult PT Treatment:                                                DATE: 09/06/24 Therapeutic Exercise: Low trunk rotation x20  Piriformis stretch 30 sec each side seated  Prone knee flexion stretch 20 sec x2 each side  Tennis ball to left glute area for decreased tissue restriction 2 min  Hip abduction side lying x20 each side  Manual Therapy: STM to piriformis, glute region to decrease tissue restriction  Neuromuscular re-ed: Bridge in supine Bridge + toe raise/heel  drive Therapeutic Activity: Wall sits 20 sec - hurt her left knee    [facility] Adult PT Treatment:                                                DATE: 08/23/24 Therapeutic Exercise: Nustep level 3 for 5 min- SPT present to discuss pt status Low trunk rotation x20  Hamstring stretch left side 2x20 sec  Manual Therapy: STM to left QL- tightness restriction  Neuromuscular re-ed: Posterior pelvic tilt 2x10 Glute bridges 2x10 Pelvic tilt + heel drag on stability ball x20  Modalities: Ice pack to left QL for 10 min    [facility] Adult PT Treatment:                                                DATE: 08/16/24 Therapeutic Exercise: Hook lying clamshells with green band x30 Low trunk rotation x20  Seated QL stretch left side 2x20 sec  Manual Therapy: STM to left QL- moderate tissue restriction  Self tissue mobilization using tennis ball on QL muscle against wall for decreased tissue restriction  Neuromuscular re-ed: Posterior pelvic tilt in supine x20  Pelvic tilt + heel drag on stability ball x20  Modalities: Ice pack to left QL for 10 min       PATIENT EDUCATION:  Education details: See treatment  Person educated: Patient Education method: Explanation and Demonstration Education comprehension: verbalized understanding and returned demonstration  HOME EXERCISE PROGRAM: Access Code: [email or web address] URL: [email or web address] Date: 08/09/2024 Prepared by: Darice Conine Exercises - Supine March  - 1 x daily - 7 x weekly - 2 sets - 10 reps - Supine Bridge  - 1 x daily - 7 x weekly - 2 sets - 10 reps - Hooklying Clamshell with Resistance  - 1 x daily - 7 x weekly - 2 sets - 10 reps - Supine Hip Adduction Isometric with Ball  - 1 x daily - 7 x weekly - 2 sets - 10 reps - Standing Hip Abduction with Counter Support  - 1 x daily - 7 x weekly - 2 sets - 10 reps - Supine Lower Trunk Rotation  - 1 x daily - 7 x weekly - 2 sets - 10 reps   ASSESSMENT:  CLINICAL  IMPRESSION:   Improvement noted with single limb balance, after practicing weight shifts side to side.  STM left paraspinals and glute region, increased tissue restriction found esp in glute, pt stated feeling relief after. Incorporated hip strengthening exercises pt tolerated well, however only able to perform clamshells when on right side secondary to left sided pain during weight bearing. Discussed possible discharge next visit.    EVAL: Patient is a 73 y.o. female who was seen today for physical therapy evaluation and treatment for s/p lumbar fusion and Lt trochanteric bursitis. Pt presents with significant deficits in hip and core strength, impaired gait, balance and decreased functional activity tolerance. Pt will benefit from skilled PT to address deficits and improve functional mobility.   OBJECTIVE IMPAIRMENTS: decreased activity tolerance, decreased ROM, decreased strength, postural dysfunction, and pain.   ACTIVITY LIMITATIONS: lifting, bending, standing, and locomotion level  PARTICIPATION LIMITATIONS: shopping, community activity, and yard work  PERSONAL FACTORS: Past/current experiences, Time since onset of injury/illness/exacerbation, and 1-2 comorbidities: bursitis, neuromuscular disorder are also affecting patient's functional outcome.   REHAB POTENTIAL: Good  CLINICAL DECISION MAKING: Evolving/moderate complexity  EVALUATION COMPLEXITY: Moderate   GOALS: Goals reviewed with patient? Yes  SHORT TERM GOALS: Target date: 08/23/2024    Pt will be independent in initial HEP Baseline: Goal status: MET 08/23/24  2.  Pt will improve LE strength to 4/5 to improve standing and walking tolerance Baseline:  Goal status: Partially met 08/23/24    LONG TERM GOALS: Target date: 10/27/2024      Pt will be independent with advanced HEP Baseline:  Goal status: INITIAL  2.  Pt will improve modified oswestry to <= 7/50 to demo improved functional mobility Baseline:   Goal status: INITIAL  3.  Pt will tolerate walking x 30 minutes with pain <= 2/10 to complete shopping tasks Baseline:  Goal status: INITIAL  4.  Pt will be able to bend to pick up small item dropped on floor with pain <= 2/10 Baseline:  Goal status: INITIAL   PLAN:  PT FREQUENCY: 1-2x/week  PT DURATION: 8 weeks  PLANNED INTERVENTIONS: 97164- PT Re-evaluation, 97110-Therapeutic exercises, 97530- Therapeutic activity, 97112- Neuromuscular re-education, 97535- Self Care, [id number]- Manual therapy, [id number]- Electrical stimulation (unattended), 20560 (1-2 muscles), 20561 (3+ muscles)- Dry Needling, Patient/Family education, Balance training, Taping, Cryotherapy, and Moist heat.  PLAN FOR NEXT SESSION: Check LTG, possible d/c?  Lavanda Cleverly, SPT 09/29/24 11:50 AM

## 2024-10-04 ENCOUNTER — Encounter: Payer: Self-pay | Admitting: Podiatry

## 2024-10-04 ENCOUNTER — Ambulatory Visit: Admitting: Podiatry

## 2024-10-04 DIAGNOSIS — M79674 Pain in right toe(s): Secondary | ICD-10-CM | POA: Diagnosis not present

## 2024-10-04 DIAGNOSIS — B351 Tinea unguium: Secondary | ICD-10-CM | POA: Diagnosis not present

## 2024-10-04 DIAGNOSIS — M79675 Pain in left toe(s): Secondary | ICD-10-CM | POA: Diagnosis not present

## 2024-10-04 NOTE — Progress Notes (Signed)
 This patient presents to the office with chief complaint of long thick painful nails.  Patient says the nails are painful walking and wearing shoes.  This patient is unable to self treat.  This patient is unable to trim her nails since she is unable to reach her nails.  She presents to the office for preventative foot care services.  General Appearance  Alert, conversant and in no acute stress.  Vascular  Dorsalis pedis and posterior tibial  pulses are palpable  bilaterally.  Capillary return is within normal limits  bilaterally. Temperature is within normal limits  bilaterally.  Neurologic  Senn-Weinstein monofilament wire test within normal limits  bilaterally. Muscle power within normal limits bilaterally.  Nails Thick disfigured discolored nails with subungual debris  from hallux to fifth toes bilaterally. No evidence of bacterial infection or drainage bilaterally.  Orthopedic  No limitations of motion  feet .  No crepitus or effusions noted.  No bony pathology or digital deformities noted.  Skin  normotropic skin with no porokeratosis noted bilaterally.  No signs of infections or ulcers noted.     Onychomycosis  Nails  B/L.  Pain in right toes  Pain in left toes  Debridement of nails both feet followed trimming the nails with dremel tool.    RTC 3 months.   Helane Gunther DPM

## 2024-10-06 ENCOUNTER — Ambulatory Visit: Admit: 2024-10-06 | Payer: MEDICARE | Primary: Internal Medicine

## 2024-10-06 VITALS — Ht 64.0 in | Wt 161.0 lb

## 2024-10-06 DIAGNOSIS — R310 Gross hematuria: Principal | ICD-10-CM

## 2024-10-06 DIAGNOSIS — K581 Irritable bowel syndrome with constipation: Secondary | ICD-10-CM | POA: Diagnosis not present

## 2024-10-06 DIAGNOSIS — R7989 Other specified abnormal findings of blood chemistry: Secondary | ICD-10-CM | POA: Diagnosis not present

## 2024-10-06 DIAGNOSIS — I10 Essential (primary) hypertension: Secondary | ICD-10-CM | POA: Diagnosis not present

## 2024-10-06 DIAGNOSIS — N816 Rectocele: Secondary | ICD-10-CM | POA: Diagnosis not present

## 2024-10-06 DIAGNOSIS — K219 Gastro-esophageal reflux disease without esophagitis: Secondary | ICD-10-CM | POA: Diagnosis not present

## 2024-10-06 NOTE — Progress Notes (Signed)
 "    Dr. Zeno is the supervising physician today.      Laura Roman  07/12/1951       Diagnosis Orders   1. Gross hematuria  CT UROGRAM            Assessment:      1. ?Gross Hematuria  Negative hematuria work-up in 2017- found to have endometrial cancer later  CT w/ cont 02/06/23- Kidneys normal, Bladder is poorly distended, limiting evaluation. Minimal thickening of the anterior wall is likely accentuated by poor distention. Consider correlation with urinalysis to exclude cystitis.   Ucytology 06/02/23:  Negative for malignancy.   UA 06/02/23: Trace blood, trace leuks    2. Breast Cancer   Radiation and chemo    3. Endometrial cancer   Hx of hysterectomy 2018    Patient unsure if blood coming from rectum, vaginally, or from bladder. Patient defers cysto, notes she had a lot of pain with previous cysto.     Plan:  Discussed etiology of hematuria and need for workup to rule out significant pathology.   Ordered Urine culture today to assess for infectious process.  Ordered Urine cytology to assess high glade bladder cancer cells.  Ordered CT Urogram to rule out upper tract pathology to be obtained in the next 1-2 weeks.  Discussed the options for cystoscopy with Pronox to assess the bladder. Risk and benefits were discussed. Patient is  agreeable to having the cystoscopy now. Ordered cystoscopy in 2-3 weeks for hematuria.     CT Urogram 1-2 weeks at [facility]. Must be completed PRIOR to cystoscopy    First Avail (No later than 2-3 weeks after CTU) Cystoscopy with Dr. Royce, Dr. Villa or avail APP/MD for gross hematuria    RTC in 4-6 mos with GU APP to for hematuria      Discussion  We discussed the finding of hematuria and need for work up to rule out significant pathology. Work up should include cystoscopy, upper tract imaging, and cytology.  Patients who present with gross hematuria have an approximate 23% chance of having a urologic malignancy as the cause. In contradistinction, patients who present with microscopic hematuria have  an approximate 5% chance of uncovering a urologic malignancy. Patients with gross hematuria and a negative work up have around an 18% chance of missed malignancy and therefore, I recommend some form of follow up for these individuals. Approximately 43% of patients who undergo work up for microscopic hematuria have a non-diagnostic work up. Fortunately, only a very small percentage of these patients have a missed malignancy and therefore, the follow-up can be limited.       Chief Complaint   Patient presents with    Hematuria     History of Present Illness: Laura Roman is a 73 y.o. female with history of gross hematuria, who presents today to the acute care clinic with symptoms concerning for recurrent gross hematuria. Patient was last seen by Beverlee Reusing, PA on 06/02/23.    Today, the patient is doing well. Denies any recent illnesses. No f/c/n/v/d.     Denies associated dysuria.   Reports any gross hematuria.  Reports taking a ASA 81 mg daily    Reports  any concerns for UTI or h/o recurrent UTIs.  Denies any associated flank, abdominal or suprapubic pain.     Any History of Stones:  Yes     Any smoking history Yes.    Former quit: 40-50 years ago  Any prior exposure to chemicals,  pelvic radiation or chemotherapy  Yes  Any family history of RCC or Bladder Ca No  Any family history of Prostate Ca No  Any unexplained weight loss, fatigue, or night sweats: No  Any history of abdominal surgeries No      Prior Hx:  06/02/23: Patient was last seen by Beverlee Reusing, PA in consultation for gross hematuria and has been referred by Dumaran, Raymund S, MD.     Patient notes blood after hard bowel movement in April, unsure if it was from [facility] or bladder. She notes blood in the toilet the following day w/o BM. Saw gynecologist who stated this is not coming vaginally. She was told from GI that she has an anal fissure so she cannot have a colonoscopy yet.     No family history of  bladder or kidney cancer  Former smoker: quit 50  years ago, off in on in 20s, social smoking  Radiation and chemo for breast cancer    Past Medical History:   Diagnosis Date    Allergic rhinitis     Anxiety     Breast cancer (HCC) 01/2020    Dr Gevena; LEFT T2N0M0 invasive adenoca  ER/PR/Her2 neg Dr GORMAN Spray neoadjuvant AC-Taxol; lumpectomy and Ln dissection 10/21    Compression fx, thoracic spine (HCC) 2007    negative DEXA Dr. Larri    DM (diabetes mellitus) (HCC) 10/2017    on basis of fbs>125; intol metformin    Dyslipidemia     Endometrial cancer (HCC) 02/2017    Dr Margret; stage 1B gr 1 endometroid adenoca w foci squamous diff of the endometrium; RA TLH BSO PLND, 0/13LN, MSI intact    Familial tremor     bro/dad have it as well    Frozen shoulder     right Dr. 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 Arlice, Dr. Larri    Osteopenia     DEXA -0.5 spine, -0.8 hip (1/18); 0.5 spine, -1.3 hip (3/22); -1.1 wrist, -1.1 hip (4/23)    Overweight (BMI 25.0-29.9)     IF 7/18 start weight 168 lbs not doing     Painless hematuria 04/2016    Dr Trudy, neg eval    Palpitations 2008    neg thallium 2008, nl holter 2008, echo nl lv/ef 65%/tr mr/dd/nl pasp    Syncope     neurocardiogenic by tilt 1994    TMJ syndrome 2023    LEFT after [place] tooth surgery    Venous insufficiency     Vitamin D deficiency        Past Surgical History:   Procedure Laterality Date    BREAST LUMPECTOMY  07/2020    and LN dissection Dr Cloria    COLONOSCOPY      Dr Rubin (2007) neg; (04/22/18) neg    HEMORRHOID SURGERY      Dr. Rubin 2007    HYSTERECTOMY (CERVIX STATUS UNKNOWN)  03/11/2017    Dr Margret    HYSTERECTOMY, TOTAL  ABDOMINAL (  CERVIX REMOVED)      TUBAL LIGATION      UROLOGICAL SURGERY  06/2016    Dr Trudy; bladder bx showed benign lesions    US  BREAST BIOPSY W LOC DEVICE 1ST LESION LEFT Left 02/21/2020    US  BREAST NEEDLE BIOPSY LEFT 02/21/2020 [facility] US          Current Outpatient Medications:     JANUVIA  100 MG tablet, TAKE 1 TABLET BY MOUTH ONCE DAILY, Disp: 90 tablet, Rfl: 3    atorvastatin  (LIPITOR) 40 MG tablet, take 1 tablet by mouth once daily, Disp: 90 tablet, Rfl: 3    lansoprazole  (PREVACID ) 30 MG delayed release capsule, take 1 capsule by mouth once daily before breakfast, Disp: 90 capsule, Rfl: 3    citalopram  (CELEXA ) 10 MG tablet, take 1 tablet by mouth once daily, Disp: 90 tablet, Rfl: 3    gabapentin  (NEURONTIN ) 300 MG capsule, Take 1 capsule by mouth daily., Disp: , Rfl:     Probiotic CHEW, Take by mouth, Disp: , Rfl:     ELDERBERRY PO, Take by mouth (Patient not taking: Reported on 08/16/2024), Disp: , Rfl:     Multiple Vitamins-Minerals (MULTIVITAMIN ADULTS 50+ PO), Take 1 tablet by mouth daily, Disp: , Rfl:     Cholecalciferol (VITAMIN D3) 1.25 MG (50000 UT) CAPS, Take 1 capsule by mouth once a week, Disp: , Rfl:     Lancets MISC, Use daily as directed, Disp: , Rfl:     aspirin 81 MG chewable tablet, Take 1 tablet by mouth daily, Disp: , Rfl:     Allergies   Allergen Reactions    Lidocaine Other (See Comments)     anxious    Metformin Diarrhea    Nabumetone Nausea Only    Thimerosal (Thiomersal) Hives       Social History     Socioeconomic History    Marital status: Married     Spouse name: Not on file    Number of children: 2    Years of education: Not on file    Highest education level: Not on file   Occupational History    Occupation: ret benefits specialist   Tobacco Use    Smoking status: Former     Passive exposure: Never    Smokeless tobacco: Never   Vaping Use    Vaping status: Never Used   Substance and Sexual Activity    Alcohol use: Yes     Alcohol/week: 1.0 standard drink of alcohol      Types: 1 Shots of liquor per week     Comment: One drink a week since breast cancer recovery    Drug use: No    Sexual activity: Not Currently     Partners: Male   Other Topics Concern    Not on file   Social History Narrative    Not on file     Social Drivers of Health     Financial Resource Strain: Low Risk  (08/10/2023)    Overall Financial Resource Strain (CARDIA)     Difficulty of Paying Living Expenses: Not hard at all   Food Insecurity: No Food Insecurity (02/14/2024)    Hunger Vital Sign     Worried About Running Out of Food in the Last Year: Never true     Ran Out of Food in the Last Year: Never true   Transportation Needs: No Transportation Needs (02/14/2024)    PRAPARE - Therapist, Art (Medical):  No     Lack of Transportation (Non-Medical): No   Physical Activity: Unknown (02/14/2024)    Exercise Vital Sign     Days of Exercise per Week: 0 days     Minutes of Exercise per Session: Patient declined   Stress: Not on file   Social Connections: Not on file   Intimate Partner Violence: Not on file   Housing Stability: Low Risk  (02/14/2024)    Housing Stability Vital Sign     Unable to Pay for Housing in the Last Year: No     Number of Times Moved in the Last Year: 0     Homeless in the Last Year: No       Family History   Problem Relation Age of Onset    Heart Disease Paternal Grandfather     Kidney Disease Paternal Grandfather     Stroke Paternal Grandmother     Diabetes Mother     Elevated Lipids Father     Cancer Father         Multiple Myeloma     PHYSICAL EXAMINATION:   Ht 1.626 m (5' 4)   Wt 73 kg (161 lb)   LMP  (LMP Unknown)   BMI 27.64 kg/m   Constitutional: WDWN, Pleasant and appropriate affect, No acute distress.    CV:  No peripheral swelling noted  Respiratory: No respiratory distress or difficulties  Abdomen: No abdominal distention.   Skin: No jaundice.    Neuro/Psych:  Alert and oriented x 3. Affect appropriate.   MSK: FROM    Labs & Imaging:   No results found for  this visit on 10/06/24.    CT Abd Pelv W Cont 02/06/23:  Impression:  1. Bladder is poorly distended, limiting evaluation. Minimal thickening of the anterior wall is likely accentuated by poor distention. Consider correlation with urinalysis to exclude cystitis.   2. Otherwise no acute radiographic abnormality to explain the patient's clinical symptomatology. Specifically, no bowel obstruction, bowel wall thickening or acute inflammatory process.   3. No findings that are suspicious for recurrent or metastatic disease.   Findings:  KIDNEYS/URETERS/BLADDER: The kidneys enhance symmetrically. There is no hydroureteronephrosis. Bladder poorly distended, limiting evaluation with questionable anterior wall thickening.   PELVIC ORGANS: Prior hysterectomy with unremarkable vaginal cuff.    A copy of today's office visit with all pertinent imaging results and labs were sent to the referring physician,Dumaran, Nazario Russom RAMAN, MD    Thor Molt, DNP, NP-C  Acute Care Clinic Nurse Practitioner    [facility]    [address] [place], [address]  [place], [place] [address]  Office: [phone]   Pager: [phone]    Patient advised that Artificial Intelligence or an AI powered scribe is being used to securely summarize relevant medical information from spoken conversations during the visit to assist in documenting the clinical encounter     Medical documentation is prepped with the assistance of Lenord Sor. K, medical scribe for Thor Molt, DNP, NP-C on 10/06/2024  "

## 2024-10-06 NOTE — Addendum Note (Signed)
"  Addended by: JOSHUA SETTER on: 10/06/2024 01:30 PM     Modules accepted: Orders    "

## 2024-10-10 ENCOUNTER — Ambulatory Visit: Admitting: Physical Therapy

## 2024-10-10 ENCOUNTER — Encounter: Payer: Self-pay | Admitting: Physical Therapy

## 2024-10-10 DIAGNOSIS — M6281 Muscle weakness (generalized): Secondary | ICD-10-CM | POA: Diagnosis not present

## 2024-10-10 DIAGNOSIS — M5459 Other low back pain: Secondary | ICD-10-CM

## 2024-10-10 NOTE — Therapy (Addendum)
 " OUTPATIENT PHYSICAL THERAPY THORACOLUMBAR TREATMENT AND DISCHARGE   Patient Name: Mia [name] MRN: [id number] DOB:December 14, 1950, 73 y.o., female Today's Date: 10/10/2024  END OF SESSION:  PT End of Session - 10/10/24 1059     Visit Number 7    Number of Visits 10    Date for Recertification  10/27/24    Authorization Type medicare    Authorization - Visit Number 9    Progress Note Due on Visit 10    PT Start Time 1017    PT Stop Time 1057    PT Time Calculation (min) 40 min    Activity Tolerance Patient tolerated treatment well    Behavior During Therapy [facility] for tasks assessed/performed                Past Medical History:  Diagnosis Date   Anal fissure    Anemia    Arthritis    Blood transfusion    C. difficile colitis    Chest pain    a. 01/2013 MV: EF 59%, no ischemia.   Chronic Dyspnea    a. 01/2013 Echo: EF 60-65%, Gr 1 DD, PASP 74mmHg.// Echo 01/2020: EF 60-65, no RWMA, GR 1 DD, GLS -21.6%, normal RV SF, trivial MR    COVID-19    DDD (degenerative disc disease), cervical    DDD (degenerative disc disease), lumbar    DVT (deep venous thrombosis) (HCC) 06/23/2023   left lower extremity   Fibromyalgia    Gall stones    GERD (gastroesophageal reflux disease)    gastritis   Hemorrhoids    Hypertension    Interstitial cystitis    MCI (mild cognitive impairment) 11/25/2017   Memory difficulty 12/14/2013   Neuromuscular disorder (HCC)    sclerosis   Osteoporosis    Peptic ulcer    PONV (postoperative nausea and vomiting)    Pseudogout    Raynaud phenomenon    Rectal bleeding    Sleep apnea    a. on cpap.   Stress fracture of left tibia 11/05/2016   SVT (supraventricular tachycardia)    Syncope 09/10/2015   Thyroid  disease    hypothyroidism   Tremor, essential 05/14/2016   Past Surgical History:  Procedure Laterality Date   APPENDECTOMY     BLADDER SURGERY     x2   BLADDER SURGERY     BREAST BIOPSY     CARDIAC CATHETERIZATION     CARPAL  TUNNEL RELEASE     CATARACT EXTRACTION Bilateral    CHOLECYSTECTOMY     DILATION AND CURETTAGE OF UTERUS     ENTEROCELE REPAIR     x2   EYE SURGERY     retina   herniated disc  11/25/2021   L4 and L5   hysterectomy - unknown type     JOINT REPLACEMENT     KNEE ARTHROPLASTY     KNEE SURGERY     x6   NECK SURGERY     fusion   OOPHORECTOMY     QUADRICEPS REPAIR Right    RECTOCELE REPAIR     x2   SHOULDER ARTHROSCOPY WITH SUBACROMIAL DECOMPRESSION, ROTATOR CUFF REPAIR AND BICEP TENDON REPAIR  10/06/2012   Procedure: SHOULDER ARTHROSCOPY WITH SUBACROMIAL DECOMPRESSION, ROTATOR CUFF REPAIR AND BICEP TENDON REPAIR;  Surgeon: Eva Elsie Herring, MD;  Location: [facility];  Service: Orthopedics;  Laterality: Right;  Arthroscopic  Repair  of  Subscapularis, Open Biceps Tenodesis   SHOULDER SURGERY     bilateral- bones  spur   SHOULDER SURGERY Left 04/15/2021   TONSILLECTOMY     TOTAL KNEE ARTHROPLASTY     TOTAL SHOULDER ARTHROPLASTY     TRANSFORAMINAL LUMBAR INTERBODY FUSION (TLIF) WITH PEDICLE SCREW FIXATION 1 LEVEL Left 04/20/2024   Procedure: TRANSFORAMINAL LUMBAR INTERBODY FUSION (TLIF) WITH PEDICLE SCREW FIXATION 1 LEVEL;  Surgeon: Beuford Anes, MD;  Location: [facility];  Service: Orthopedics;  Laterality: Left;  LEFT-SIDED LUMBAR 4 - LUMBAR 5 TRANSFORAMINAL LUMBAR INTERBODY FUSION AND DECOMPRESSION WITH INSTRUMENTATION AND ALLOGRAFT   Patient Active Problem List   Diagnosis Date Noted   Radiculopathy 04/20/2024   History of DVT of lower extremity 08/23/2023   Pain of right sacroiliac joint 10/15/2022   Palpitations 06/02/2022   Pernicious anemia 09/11/2020   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   Weakness of both lower extremities 06/11/2020   Hyponatremia 05/22/2020   Gait instability 06/20/2019   Facet arthritis of lumbar region 05/10/2018   MCI (mild cognitive impairment) 11/25/2017   Restrictive lung disease 07/30/2017   Mixed hyperlipidemia 06/18/2017    Rectocele 04/26/2017   Irritable bowel syndrome with both constipation and diarrhea 04/26/2017   Osteoporosis 04/18/2017   Trochanteric bursitis of both hips 02/24/2017   Inflammatory arthritis 09/30/2016   High risk medication use 09/30/2016   History of Clostridium difficile colitis 09/30/2016   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   Pseudogout 09/30/2016   DDD cervical spine status post fusion 09/30/2016   DDD thoracic spine 09/30/2016   H/O total knee replacement, right 09/30/2016   Age-related osteoporosis without current pathological fracture 09/30/2016   Tremor, essential 05/14/2016   Right foot pain 04/14/2016   B12 deficiency 09/10/2015   Cervical facet joint syndrome 09/10/2015   Chronic fatigue 09/10/2015   Aortic atherosclerosis 04/01/2015   DOE (dyspnea on exertion)    Primary osteoarthritis of right knee 08/13/2014   Benign head tremor 06/11/2014   Lumbar degenerative disc disease 06/11/2014   IFG (impaired fasting glucose) 03/09/2014   Hemorrhoid 03/09/2014   Retinal wrinkling, right eye 03/09/2014   Fibromyalgia 03/09/2014   GERD (gastroesophageal reflux disease) 03/09/2014   History of arthroplasty of right knee 01/31/2014   Hemorrhoids, external, thrombosed 06/22/2011   Hypertension 03/11/2011   SVT (supraventricular tachycardia)    Raynaud phenomenon    Nonspecific (abnormal) findings on radiological and other examination of body structure 08/25/2010   COMPUTERIZED TOMOGRAPHY, CHEST, ABNORMAL 08/25/2010   OSA (obstructive sleep apnea) 04/24/2009   Allergic rhinitis 06/11/2008   Dyspnea 06/11/2008   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   Chronic diastolic CHF (congestive heart failure) (HCC) 06/08/2008   INTERSTITIAL CYSTITIS 06/08/2008    PCP: Alvan  REFERRING PROVIDER: Beuford Anes  REFERRING DIAG: s/p Lumbar fusion, Lt trochanteric bursitis  Rationale for Evaluation and Treatment: Rehabilitation  THERAPY DIAG:  Muscle  weakness (generalized)  Other low back pain  ONSET DATE: 04/20/24  SUBJECTIVE:  SUBJECTIVE STATEMENT:  Pt states she has the most pain with laying on her side. She also has difficulty laying on her hips at night  EVAL: Pt is s/p L4/5 fusion on 04/20/24. She had been wearing back brace until 2 weeks ago and is now cleared for all activity. She states she has the most difficulty with bending and with walking longer than 20-30 minutes. She states she has pain in her mid back and Lt lower back. Pt has been using tylenol  and lidocaine  patches for pain control. Pt also has Lt trochanteric bursitis and had an injection 07/14/24.  PERTINENT HISTORY:  DDD, osteoporosis, vertigo  PAIN:  Are you having pain? Yes: NPRS scale: 2/10 Pain location: Lt hip, mid back  Pain description: sore, ache Aggravating factors: walking, bending Relieving factors: meds, pain patch  PRECAUTIONS: Fall and Other: per pt MD recommends no heavy lifting, osteoporosis  RED FLAGS: None   WEIGHT BEARING RESTRICTIONS: No  FALLS:  Has patient fallen in last 6 months? No   OCCUPATION: enjoys home projects, community activities  PLOF: Independent  PATIENT GOALS: decrease pain  NEXT MD VISIT: December 2025  OBJECTIVE:  Note: Objective measures were completed at Evaluation unless otherwise noted.  DIAGNOSTIC FINDINGS:  L4/5 fusion  PATIENT SURVEYS:  Modified Oswestry: 17/50   COGNITION: Overall cognitive status: Within functional limits for tasks assessed       POSTURE:  Low tone PALPATION: Mild increase in mm spasticity bilat lumbar paraspinals  LUMBAR ROM:   AROM eval  Flexion 70%  Extension To neutral  - pain  Right lateral flexion WFL - pain end range  Left lateral flexion WFL  Right rotation 50% - pain Lt  side  Left rotation 70%   (Blank rows = not tested)  LOWER EXTREMITY ROM:    Grossly WFL bilat hips   LOWER EXTREMITY MMT:    MMT Right eval Left eval Right 08/23/24 Left  08/23/24 Rt/Left 12/16   Hip flexion 3- 3 3 3  3/3+  Hip extension 3- 3 4 4  4/4  Hip abduction 3- 3 4- 4- 4/4  Hip adduction       Hip internal rotation       Hip external rotation 3- 3     Knee flexion       Knee extension       Ankle dorsiflexion       Ankle plantarflexion       Ankle inversion       Ankle eversion        (Blank rows = not tested)  LUMBAR SPECIAL TESTS:  Straight leg raise test: Negative    GAIT: Distance walked: 100' Assistive device utilized: None Level of assistance: Complete Independence Comments: wide BOS  TREATMENT DATE:  [facility] Adult PT Treatment:                                                DATE: 10/10/24 Therapeutic Exercise: LTR x 20 for warm up Bridge x 10 --> bridge with add squeeze x 10 Supine clam green TB x 20 MMT (see above) Manual:  STM Rt QL Therapeutic Activity: Standing reaching towards floor to simulate picking up items Standing lumbar flexion in tolerable range Standing hip abd x 10 bilat Standing hip ext x 10 bilat Modified oswestry = 15/50   [facility] Adult PT Treatment:  DATE: 09/29/24 Therapeutic Exercise: Low trunk rotation x20  Hip abduction side lying x20 laying on right side only secondary to pain when lying on right side  Piriformis stretch in seated 20 sec x2 Hamstring stretch 20 sec left side  HEP review  Manual Therapy: STM to left QL and paraspinals -tissue restriction found  Neuromuscular re-ed: Bridge in supine x10 Bridge + toe raise/heel drive k89 Therapeutic Activity: Standing hip flexion x10 (marches)  Standing hip abd x10 each side  Standing hip hamstring curls x10    [facility] Adult PT Treatment:                                                DATE: 09/06/24 Therapeutic  Exercise: Low trunk rotation x20  Piriformis stretch 30 sec each side seated  Prone knee flexion stretch 20 sec x2 each side  Tennis ball to left glute area for decreased tissue restriction 2 min  Hip abduction side lying x20 each side  Manual Therapy: STM to piriformis, glute region to decrease tissue restriction  Neuromuscular re-ed: Bridge in supine Bridge + toe raise/heel drive Therapeutic Activity: Wall sits 20 sec - hurt her left knee    [facility] Adult PT Treatment:                                                DATE: 08/23/24 Therapeutic Exercise: Nustep level 3 for 5 min- SPT present to discuss pt status Low trunk rotation x20  Hamstring stretch left side 2x20 sec  Manual Therapy: STM to left QL- tightness restriction  Neuromuscular re-ed: Posterior pelvic tilt 2x10 Glute bridges 2x10 Pelvic tilt + heel drag on stability ball x20  Modalities: Ice pack to left QL for 10 min      PATIENT EDUCATION:  Education details: See treatment  Person educated: Patient Education method: Explanation and Demonstration Education comprehension: verbalized understanding and returned demonstration  HOME EXERCISE PROGRAM: Access Code: [email or web address] URL: [email or web address] Date: 10/10/2024 Prepared by: Darice Conine  Exercises - Supine March  - 1 x daily - 7 x weekly - 2 sets - 10 reps - Supine Bridge  - 1 x daily - 7 x weekly - 2 sets - 10 reps - Supine Bridge with Mini Swiss Ball Between Knees  - 1 x daily - 7 x weekly - 2 sets - 10 reps - Hooklying Clamshell with Resistance  - 1 x daily - 7 x weekly - 2 sets - 10 reps - Standing Hip Abduction with Counter Support  - 1 x daily - 7 x weekly - 2 sets - 10 reps - Standing Hip Extension with Counter Support  - 1 x daily - 7 x weekly - 2 sets - 10 reps - Supine Lower Trunk Rotation  - 1 x daily - 7 x weekly - 2 sets - 10 reps  ASSESSMENT:  CLINICAL IMPRESSION:   Pt has improved hip and back strength and activity  tolerance. She continues with pain in bilat hips when sidelying. She has decreased standing and walking tolerance but has improved since evaluation. She feels ready to d/c to HEP. She returns to MD this week for next steps.   EVAL: Patient is a 73 y.o. female who was seen  today for physical therapy evaluation and treatment for s/p lumbar fusion and Lt trochanteric bursitis. Pt presents with significant deficits in hip and core strength, impaired gait, balance and decreased functional activity tolerance. Pt will benefit from skilled PT to address deficits and improve functional mobility.      GOALS: Goals reviewed with patient? Yes  SHORT TERM GOALS: Target date: 08/23/2024    Pt will be independent in initial HEP Baseline: Goal status: MET 08/23/24  2.  Pt will improve LE strength to 4/5 to improve standing and walking tolerance Baseline:  Goal status: Partially met 08/23/24    LONG TERM GOALS: Target date: 10/27/2024      Pt will be independent with advanced HEP Baseline:  Goal status: MET  2.  Pt will improve modified oswestry to <= 7/50 to demo improved functional mobility Baseline:  Goal status: IN PROGRESS  3.  Pt will tolerate walking x 30 minutes with pain <= 2/10 to complete shopping tasks Baseline:  Goal status: PARTIALLY MET - pt unable to walk 30 minutes due to LE fatigue, she is able to walk holding onto a shopping cart for 30 minutes  4.  Pt will be able to bend to pick up small item dropped on floor with pain <= 2/10 Baseline:  Goal status: NOT MET - pain is 5/10 with lifting item, 2/10 afterwards   PLAN:  PT FREQUENCY: 1-2x/week  PT DURATION: 8 weeks  PLANNED INTERVENTIONS: 97164- PT Re-evaluation, 97110-Therapeutic exercises, 97530- Therapeutic activity, 97112- Neuromuscular re-education, 97535- Self Care, 02859- Manual therapy, G0283- Electrical stimulation (unattended), 20560 (1-2 muscles), 20561 (3+ muscles)- Dry Needling, Patient/Family  education, Balance training, Taping, Cryotherapy, and Moist heat.  PLAN FOR NEXT SESSION: d/c  Darice Conine, PT,DPT12/16/202511:00 AM  PHYSICAL THERAPY DISCHARGE SUMMARY  Visits from Start of Care: 7  Current functional level related to goals / functional outcomes: Improving strength and activity tolerance   Remaining deficits: See above   Education / Equipment: HEP   Patient agrees to discharge. Patient goals were partially met. Patient is being discharged due to being pleased with the current functional level.  Darice Conine, PT,DPT01/27/2610:22 AM     "

## 2024-10-11 ENCOUNTER — Encounter

## 2024-10-12 NOTE — Telephone Encounter (Signed)
"  Last Office Visit: 08/16/2024  Next Office Visit: 02/19/2025  Last Refill: 08/31/2023    "

## 2024-10-13 MED ORDER — CITALOPRAM HYDROBROMIDE 10 MG PO TABS
10 | ORAL_TABLET | Freq: Every day | ORAL | 3 refills | 90.00000 days | Status: AC
Start: 2024-10-13 — End: ?

## 2024-10-13 MED ORDER — ATORVASTATIN CALCIUM 40 MG PO TABS
40 | ORAL_TABLET | Freq: Every day | ORAL | 3 refills | 90.00000 days | Status: AC
Start: 2024-10-13 — End: ?

## 2024-10-16 ENCOUNTER — Encounter: Payer: Self-pay | Admitting: Family Medicine

## 2024-10-16 ENCOUNTER — Ambulatory Visit: Admitting: Family Medicine

## 2024-10-16 ENCOUNTER — Telehealth: Payer: Self-pay | Admitting: *Deleted

## 2024-10-16 VITALS — BP 130/61 | HR 58 | Ht 66.0 in | Wt 167.0 lb

## 2024-10-16 DIAGNOSIS — F02A0 Dementia in other diseases classified elsewhere, mild, without behavioral disturbance, psychotic disturbance, mood disturbance, and anxiety: Secondary | ICD-10-CM | POA: Diagnosis not present

## 2024-10-16 DIAGNOSIS — R7301 Impaired fasting glucose: Secondary | ICD-10-CM | POA: Diagnosis not present

## 2024-10-16 DIAGNOSIS — R0602 Shortness of breath: Secondary | ICD-10-CM | POA: Diagnosis not present

## 2024-10-16 DIAGNOSIS — G4733 Obstructive sleep apnea (adult) (pediatric): Secondary | ICD-10-CM

## 2024-10-16 LAB — POCT GLYCOSYLATED HEMOGLOBIN (HGB A1C): Hemoglobin A1C: 5.4 % (ref 4.0–5.6)

## 2024-10-16 MED ORDER — AMBULATORY NON FORMULARY MEDICATION: 0 refills | Status: AC

## 2024-10-16 NOTE — Progress Notes (Signed)
 "  Acute Office Visit  Patient ID: Mia [name], female    DOB: 1950-11-04, 73 y.o.   MRN: [id number]  PCP: Alvan Dorothyann BIRCH, MD  Chief Complaint  Patient presents with   Medical Management of Chronic Issues    Subjective:     HPI  Discussed the use of AI scribe software for clinical note transcription with the patient, who gave verbal consent to proceed.  History of Present Illness Mia [name] is a 73 year old female with sleep apnea who presents with issues related to CPAP use.  Cpap intolerance and sleep apnea symptoms - Difficulty using CPAP machine due to mask and strap discomfort - Has not used CPAP for the last three nights because straps require significant tightening, causing chin itching and irritation - CPAP machine indicates no leaks despite discomfort - Current pressure setting is sixteen; feels mask must be tightened excessively to prevent leaks - Frequent daytime fatigue - Uncertain if daytime shortness of breath is related to sleep apnea - No recent pulmonary function testing   Dyspnea - Episodes of shortness of breath during the day, especially when sitting - Feels need to take deep breaths - No recent pulmonary function test  Musculoskeletal complaints - Hip pain affecting gait - Ruptured quadriceps tendon in knee - Trigger finger with locking, jerking, and pain - History of knee replacement - Concerned about ruptured quadriceps tendon - Currently seeing specialists for orthopedic issues  Vertigo - Improvement in vertigo symptoms   ROS     Objective:    BP 130/61   Pulse (!) 58   Ht 5' 6 (1.676 m)   Wt 167 lb (75.8 kg)   SpO2 99%   BMI 26.95 kg/m    Physical Exam Vitals and nursing note reviewed.  Constitutional:      Appearance: Normal appearance.  HENT:     Head: Normocephalic and atraumatic.  Eyes:     Conjunctiva/sclera: Conjunctivae normal.  Cardiovascular:     Rate and Rhythm: Normal rate and regular rhythm.   Pulmonary:     Effort: Pulmonary effort is normal.     Breath sounds: Normal breath sounds.  Skin:    General: Skin is warm and dry.  Neurological:     Mental Status: She is alert.  Psychiatric:        Mood and Affect: Mood normal.       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       Other Visit Diagnoses       SOB (shortness of breath)       Relevant Orders   Pulmonary function test       Assessment and Plan Assessment &  Plan Obstructive sleep apnea Difficulty with CPAP due to high pressure and mask fit. Daytime shortness of breath not typical for untreated sleep apnea. Discussed Inspire device limitations. - Decreased CPAP pressure to 15 cm H2O. - Advised trying a different mask for better fit and comfort. - Will schedule pulmonary function testing or spirometry in January or early February. - - if still no tolerating, plan to refer to [facility] for sleep consult.   MSK concerns - has upcoming appts with Ortho.   SOB - will schedule for PFTs at her convenience. She feels like has a take a deep breath at times.    Meds ordered this encounter  Medications   AMBULATORY NON FORMULARY MEDICATION    Sig: Adjust CPAP - Decrease pressure from 16 to 15. Need repeat download in 2 weeks, fax to [phone] Please provide  alternate mask    Dispense:  1 each    Refill:  0    No follow-ups on file.  Dorothyann Byars, MD [facility] at [facility]   "

## 2024-10-16 NOTE — Telephone Encounter (Signed)
 Please call pt and inform her that we did get some B12 in today. She can schedule a NV to get this done. Thanks!!

## 2024-10-16 NOTE — Assessment & Plan Note (Signed)
Continue with Aricept 

## 2024-10-18 LAB — CYTOLOGY, URINE

## 2024-10-18 NOTE — Result Encounter Note (Signed)
"  Urine Cytology was negative for high grade bladder cancer.     Please make sure that the cystoscopy is ordered and scheduled if needed. OK to be completed by Dr. Royce, Dr. Villa, or other provider. Cystoscopy pending for the next 2-3 weeks for gross hematuria.     No further intervention is needed at this time. Continue drinking at least 64 oz of fluids. Avoid bladder irritants such as caffeine. If any additional information is needed, please feel free to reach out via telephone [phone] or Mychart.      Please make sure patient has a scheduled appointment for the cystoscopy to look into the bladder.      Thor Molt FNP-C  Acute Care Clinic Nurse Practitioner  [facility]    [address] [place], [address]  [place], [place] [address]  P: [phone]   F: [phone]    "

## 2024-10-19 NOTE — Progress Notes (Deleted)
 "  Office Visit Note  Patient: Mia [name]             Date of Birth: [birth year]           MRN: [id number]             PCP: Alvan Dorothyann BIRCH, MD Referring: Alvan Dorothyann BIRCH, MD Visit Date: 11/01/2024 Occupation: Data Unavailable  Subjective:    History of Present Illness: Mia [name] is a 73 y.o. female with history of seronegative rheumatoid arthritis and osteoarthritis.  Patient remains on Arava  10 mg alternating with 20 mg every other day.   CBC and CMP updated on 09/12/24.  Discussed the importance of holding arava  if she develops signs or symptoms of an infection and to resume once the infection has completely cleared.     Activities of Daily Living:  Patient reports morning stiffness for *** {minute/hour:19697}.   Patient {ACTIONS;DENIES/REPORTS:21021675::Denies} nocturnal pain.  Difficulty dressing/grooming: {ACTIONS;DENIES/REPORTS:21021675::Denies} Difficulty climbing stairs: {ACTIONS;DENIES/REPORTS:21021675::Denies} Difficulty getting out of chair: {ACTIONS;DENIES/REPORTS:21021675::Denies} Difficulty using hands for taps, buttons, cutlery, and/or writing: {ACTIONS;DENIES/REPORTS:21021675::Denies}  No Rheumatology ROS completed.   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) 10/16/2024   Radiculopathy 04/20/2024   History of DVT of lower extremity 08/23/2023   Pain of right sacroiliac joint 10/15/2022   Palpitations 06/02/2022   Pernicious anemia 09/11/2020   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   Weakness of both lower extremities 06/11/2020   Hyponatremia 05/22/2020   Gait instability 06/20/2019   Facet arthritis of lumbar region 05/10/2018   MCI (mild cognitive impairment) 11/25/2017   Restrictive lung disease 07/30/2017   Mixed hyperlipidemia 06/18/2017   Rectocele 04/26/2017   Irritable bowel syndrome with both  constipation and diarrhea 04/26/2017   Osteoporosis 04/18/2017   Trochanteric bursitis of both hips 02/24/2017   Inflammatory arthritis 09/30/2016   High risk medication use 09/30/2016   History of Clostridium difficile colitis 09/30/2016   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   Pseudogout 09/30/2016   DDD cervical spine status post fusion 09/30/2016   DDD thoracic spine 09/30/2016   H/O total knee replacement, right 09/30/2016   Age-related osteoporosis without current pathological fracture 09/30/2016   Tremor, essential 05/14/2016   Right foot pain 04/14/2016   B12 deficiency 09/10/2015   Cervical facet joint syndrome 09/10/2015   Chronic fatigue 09/10/2015   Aortic atherosclerosis 04/01/2015   DOE (dyspnea on exertion)    Primary osteoarthritis of right knee 08/13/2014   Benign head tremor 06/11/2014   Lumbar degenerative disc disease 06/11/2014   IFG (impaired fasting glucose) 03/09/2014   Hemorrhoid 03/09/2014   Retinal wrinkling, right eye 03/09/2014   Fibromyalgia 03/09/2014   GERD (gastroesophageal reflux disease) 03/09/2014   History of arthroplasty of right knee 01/31/2014   Hemorrhoids, external, thrombosed 06/22/2011   Hypertension 03/11/2011   SVT (supraventricular tachycardia)    Raynaud phenomenon    Nonspecific (abnormal) findings on radiological and other examination of body structure 08/25/2010   COMPUTERIZED TOMOGRAPHY, CHEST, ABNORMAL 08/25/2010   OSA (obstructive sleep apnea) 04/24/2009   Allergic rhinitis 06/11/2008   Dyspnea 06/11/2008   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 difficile colitis  Chest pain    a. 01/2013 MV: EF 59%, no ischemia.   Chronic Dyspnea    a. 01/2013 Echo: EF 60-65%, Gr 1 DD, PASP 6mmHg.//  Echo 01/2020: EF 60-65, no RWMA, GR 1 DD, GLS -21.6%, normal RV SF, trivial MR    COVID-19    DDD (degenerative disc disease), cervical    DDD (degenerative disc disease), lumbar    DVT (deep venous thrombosis) (HCC) 06/23/2023   left lower extremity   Fibromyalgia    Gall stones    GERD (gastroesophageal reflux disease)    gastritis   Hemorrhoids    Hypertension    Interstitial cystitis    MCI (mild cognitive impairment) 11/25/2017   Memory difficulty 12/14/2013   Neuromuscular disorder (HCC)    sclerosis   Osteoporosis    Peptic ulcer    PONV (postoperative nausea and vomiting)    Pseudogout    Raynaud phenomenon    Rectal bleeding    Sleep apnea    a. on cpap.   Stress fracture of left tibia 11/05/2016   SVT (supraventricular tachycardia)    Syncope 09/10/2015   Thyroid  disease    hypothyroidism   Tremor, essential 05/14/2016    Family History  Problem Relation Age of Onset   Heart attack Mother    Stroke Mother    Diabetes Mother    Heart disease Mother    Colon polyps Mother    Asthma Mother    Dementia Mother    Alzheimer's disease Mother    Heart disease Father    Heart attack Father    Asthma Father    Stroke Sister    Hypertension Sister    Rheum arthritis Sister    Dementia Sister    Asthma Sister    Parkinson's disease Sister    Lupus Sister    Asthma Sister    Breast cancer Maternal Grandmother    Wilson's disease Maternal Grandmother    Pancreatic cancer Maternal Grandfather    Autoimmune disease Daughter        in eyes   Arthritis Daughter    Deep vein thrombosis Daughter    Past Surgical History:  Procedure Laterality Date   APPENDECTOMY     BLADDER SURGERY     x2   BLADDER SURGERY     BREAST BIOPSY     CARDIAC CATHETERIZATION     CARPAL TUNNEL RELEASE     CATARACT EXTRACTION Bilateral    CHOLECYSTECTOMY     DILATION AND CURETTAGE OF UTERUS     ENTEROCELE REPAIR     x2   EYE SURGERY     retina   herniated disc  11/25/2021    L4 and L5   hysterectomy - unknown type     JOINT REPLACEMENT     KNEE ARTHROPLASTY     KNEE SURGERY     x6   NECK SURGERY     fusion   OOPHORECTOMY     QUADRICEPS REPAIR Right    RECTOCELE REPAIR     x2   SHOULDER ARTHROSCOPY WITH SUBACROMIAL DECOMPRESSION, ROTATOR CUFF REPAIR AND BICEP TENDON REPAIR  10/06/2012   Procedure: SHOULDER ARTHROSCOPY WITH SUBACROMIAL DECOMPRESSION, ROTATOR CUFF REPAIR AND BICEP TENDON REPAIR;  Surgeon: Eva Elsie Herring, MD;  Location: [facility];  Service: Orthopedics;  Laterality: Right;  Arthroscopic  Repair  of  Subscapularis, Open Biceps Tenodesis   SHOULDER SURGERY     bilateral- bones spur   SHOULDER SURGERY Left 04/15/2021  TONSILLECTOMY     TOTAL KNEE ARTHROPLASTY     TOTAL SHOULDER ARTHROPLASTY     TRANSFORAMINAL LUMBAR INTERBODY FUSION (TLIF) WITH PEDICLE SCREW FIXATION 1 LEVEL Left 04/20/2024   Procedure: TRANSFORAMINAL LUMBAR INTERBODY FUSION (TLIF) WITH PEDICLE SCREW FIXATION 1 LEVEL;  Surgeon: Beuford Anes, MD;  Location: [facility];  Service: Orthopedics;  Laterality: Left;  LEFT-SIDED LUMBAR 4 - LUMBAR 5 TRANSFORAMINAL LUMBAR INTERBODY FUSION AND DECOMPRESSION WITH INSTRUMENTATION AND ALLOGRAFT   Social History[1] Social History   Social History Narrative   Lives with her husband. Her grand daughter is living with them.  She likes to hang out with her friends and play games and do bible study.     Immunization History  Administered Date(s) Administered   Fluad Quad(high Dose 65+) 07/13/2019, 07/10/2020, 07/22/2021, 07/22/2022   Fluad Trivalent(High Dose 65+) 07/26/2023   Hepatitis B, ADULT 02/25/2021, 03/28/2021, 08/26/2021   INFLUENZA, HIGH DOSE SEASONAL PF 07/06/2016, 07/05/2017, 08/09/2024   Influenza Split 07/26/2012, 06/11/2014, 07/25/2015, 06/23/2018   Influenza Whole 07/30/2009, 08/19/2010   Influenza,inj,Quad PF,6+ Mos 06/11/2014, 07/25/2015, 06/23/2018   Influenza-Unspecified 07/26/2012, 07/26/2013,  09/05/2013, 06/11/2014, 07/25/2015, 06/23/2018, 06/27/2019, 07/13/2019, 07/10/2020, 07/22/2021   Moderna Sars-Covid-2 Vaccination 02/29/2020, 03/28/2020, 10/04/2020   PNEUMOCOCCAL CONJUGATE-20 05/16/2024   Pneumococcal Conjugate-13 01/04/2017   Pneumococcal Polysaccharide-23 07/30/2009, 11/14/2018   Pneumococcal-Unspecified 10/27/2015   Tdap 02/13/2008, 05/12/2018   Zoster Recombinant(Shingrix) 02/23/2022, 05/05/2022   Zoster, Live 09/01/2011     Objective: Vital Signs: There were no vitals taken for this visit.   Physical Exam Vitals and nursing note reviewed.  Constitutional:      Appearance: She is well-developed.  HENT:     Head: Normocephalic and atraumatic.  Eyes:     Conjunctiva/sclera: Conjunctivae normal.  Cardiovascular:     Rate and Rhythm: Normal rate and regular rhythm.     Heart sounds: Normal heart sounds.  Pulmonary:     Effort: Pulmonary effort is normal.     Breath sounds: Normal breath sounds.  Abdominal:     General: Bowel sounds are normal.     Palpations: Abdomen is soft.  Musculoskeletal:     Cervical back: Normal range of motion.  Lymphadenopathy:     Cervical: No cervical adenopathy.  Skin:    General: Skin is warm and dry.     Capillary Refill: Capillary refill takes less than 2 seconds.  Neurological:     Mental Status: She is alert and oriented to person, place, and time.  Psychiatric:        Behavior: Behavior normal.      Musculoskeletal Exam: ***  CDAI Exam: CDAI Score: -- Patient Global: --; Provider Global: -- Swollen: --; Tender: -- Joint Exam 11/01/2024   No joint exam has been documented for this visit   There is currently no information documented on the homunculus. Go to the Rheumatology activity and complete the homunculus joint exam.  Investigation: No additional findings.  Imaging: No results found.  Recent Labs: Lab Results  Component Value Date   WBC 6.0 09/12/2024   HGB 12.5 09/12/2024   PLT 290 09/12/2024    NA 137 09/12/2024   K 4.3 09/12/2024   CL 101 09/12/2024   CO2 28 09/12/2024   GLUCOSE 149 (H) 09/12/2024   BUN 10 09/12/2024   CREATININE 0.64 09/12/2024   BILITOT 0.4 09/12/2024   ALKPHOS 215 (H) 01/17/2024   AST 45 (H) 09/12/2024   ALT 48 (H) 09/12/2024   PROT 6.5 09/12/2024   ALBUMIN  4.3 01/17/2024  CALCIUM  9.7 09/12/2024   GFRAA 94 03/10/2021    Speciality Comments: No specialty comments available.  Procedures:  No procedures performed Allergies: Codeine, Myrbetriq  [mirabegron], Nystatin, Percocet [oxycodone-acetaminophen ], Propoxyphene, Acetaminophen , Erythromycin base, Ketorolac , Methadone, Oxycodone-acetaminophen , Celecoxib, Darvocet [propoxyphene n-acetaminophen ], Erythromycin, Hydrocodone , Nitrofurantoin, Pregabalin , Toradol  [ketorolac  tromethamine ], Tramadol, and Verapamil   Assessment / Plan:     Visit Diagnoses: Seronegative rheumatoid arthritis (HCC)  High risk medication use  Elevated LFTs  Trigger finger, left ring finger  History of DVT (deep vein thrombosis)  Raynaud's phenomenon without gangrene  S/P arthroscopy of left shoulder  H/O total knee replacement, right  Primary osteoarthritis of left knee  DDD (degenerative disc disease), cervical  DDD (degenerative disc disease), thoracic  S/P lumbar fusion  Pseudogout  History of foot fracture  Other osteoporosis without current pathological fracture  Medication monitoring encounter  Fibromyalgia  Chronic fatigue  History of hypertension  History of gastroesophageal reflux (GERD)  History of CHF (congestive heart failure)  IC (interstitial cystitis)  History of Clostridium difficile colitis  Mixed hyperlipidemia  S/P carpal tunnel release  Orders: No orders of the defined types were placed in this encounter.  No orders of the defined types were placed in this encounter.   Face-to-face time spent with patient was *** minutes. Greater than 50% of time was spent in  counseling and coordination of care.  Follow-Up Instructions: No follow-ups on file.   Waddell CHRISTELLA Craze, PA-C  Note - This record has been created using Dragon software.  Chart creation errors have been sought, but may not always  have been located. Such creation errors do not reflect on  the standard of medical care.     [1]  Social History Tobacco Use   Smoking status: Never    Passive exposure: Past   Smokeless tobacco: Never  Vaping Use   Vaping status: Never Used  Substance Use Topics   Alcohol use: No    Alcohol/week: 0.0 standard drinks of alcohol   Drug use: No   "

## 2024-10-20 ENCOUNTER — Ambulatory Visit (INDEPENDENT_AMBULATORY_CARE_PROVIDER_SITE_OTHER)

## 2024-10-20 DIAGNOSIS — E538 Deficiency of other specified B group vitamins: Secondary | ICD-10-CM

## 2024-10-20 NOTE — Progress Notes (Signed)
" ° °  Subjective:    Patient ID: Mia [name], female    DOB: 04-06-1951, 73 y.o.   MRN: [id number]  HPI  Patient is in office today for her B12 injection. Patient comes in every 30 days. Denies muscle,cramps, fever, weakness, irregular, or medication changes.   Review of Systems     Objective:   Physical Exam        Assessment & Plan:   Pt given her B12 injection given in her LUOQ, Tolerated both well no redness or swelling noted at the site. Patient advised to RTC in 30 days (around 11/20/24) for next B12 injection.  "

## 2024-10-22 NOTE — Result Encounter Note (Signed)
"  Urine culture was negative for an infection. No further intervention is needed at this time.     Continue drinking at least 64 oz of fluids. Avoid bladder irritants such as caffeine. If any additional information is needed, please feel free to reach out via telephone [phone] or Mychart.    Thor Molt FNP-C  Acute Care Clinic Nurse Practitioner  [facility]    [address] [place], [address]  [place], [place] [address]  P: [phone]   F: [phone]     "

## 2024-10-23 LAB — CULTURE, URINE

## 2024-10-23 MED ORDER — AMOXICILLIN 875 MG PO TABS
875 | ORAL_TABLET | Freq: Two times a day (BID) | ORAL | 0 refills | 8.00000 days | Status: AC
Start: 2024-10-23 — End: 2024-10-30

## 2024-10-23 NOTE — Addendum Note (Signed)
"  Addended by: JOSHUA THOR CROME on: 10/23/2024 08:30 AM     Modules accepted: Orders    "

## 2024-10-23 NOTE — Telephone Encounter (Addendum)
"  Called pt to make aware of ucx results and to pick up medication from pharmacy. Thanked me for call, call ended.    ----- Message from Thor Molt, APRN - NP sent at 10/23/2024  8:30 AM EST -----  Urine culture positive. Start Amox BID x 7 days for UTI    Stay hydrated by drinking at least 64 oz of fluids daily. Avoid common bladder irritants such as coffee, dark sodas, etc. No further interventions at this time. If anything else is needed, please   feel free to contact the office at [phone] or Mychart.    Thor Molt, DNP, NP-C  Acute Care Clinic Nurse Practitioner  [facility]    [address] [place], [address]  [place], [place] [address]  P: [phone]   F: [phone]      ----- Message -----  From: Edi, [facility] Incoming Amb Ref Lab Orders To [facility]  Sent: 10/18/2024  11:45 AM EST  To: Thor LITTIE Molt, APRN - NP    "

## 2024-10-23 NOTE — Telephone Encounter (Signed)
"  Laura Roman has an order for CT UROGRAM       ALL FEMALE PATIENTS NEEDING AN MRI OF PELVIS OR PROSTATE, MUST BE SCHEDULED AT ONE OF THE FOLLOWING:    **MRI/CT (Preferred [facility])  **[facility] (Preferred [place])  [facility]  [facility] / [facility]      To be done at: [facility]    Needed by:   October 26, 2024    Patient has a follow-up appointment:  No      If MRI, does patient have a pacemaker:   No    Order has been placed in connect care:  Yes    Is this a STAT order:  No      [name]   "

## 2024-10-23 NOTE — Telephone Encounter (Signed)
 FAXED CTU ORDER TO [facility] [phone]

## 2024-10-23 NOTE — Result Encounter Note (Signed)
"  Urine culture positive. Start Amox BID x 7 days for UTI    Stay hydrated by drinking at least 64 oz of fluids daily. Avoid common bladder irritants such as coffee, dark sodas, etc. No further interventions at this time. If anything else is needed, please feel free to contact the office at [phone] or Mychart.    Thor Molt, DNP, NP-C  Acute Care Clinic Nurse Practitioner  [facility]    [address] [place], [address]  [place], [place] [address]  P: [phone]   F: [phone]      "

## 2024-10-24 ENCOUNTER — Ambulatory Visit

## 2024-11-01 ENCOUNTER — Ambulatory Visit: Admitting: Physician Assistant

## 2024-11-01 DIAGNOSIS — N3010 Interstitial cystitis (chronic) without hematuria: Secondary | ICD-10-CM

## 2024-11-01 DIAGNOSIS — Z96651 Presence of right artificial knee joint: Secondary | ICD-10-CM

## 2024-11-01 DIAGNOSIS — M5134 Other intervertebral disc degeneration, thoracic region: Secondary | ICD-10-CM

## 2024-11-01 DIAGNOSIS — M818 Other osteoporosis without current pathological fracture: Secondary | ICD-10-CM

## 2024-11-01 DIAGNOSIS — R5382 Chronic fatigue, unspecified: Secondary | ICD-10-CM

## 2024-11-01 DIAGNOSIS — M0600 Rheumatoid arthritis without rheumatoid factor, unspecified site: Secondary | ICD-10-CM

## 2024-11-01 DIAGNOSIS — I7300 Raynaud's syndrome without gangrene: Secondary | ICD-10-CM

## 2024-11-01 DIAGNOSIS — Z8781 Personal history of (healed) traumatic fracture: Secondary | ICD-10-CM

## 2024-11-01 DIAGNOSIS — Z79899 Other long term (current) drug therapy: Secondary | ICD-10-CM

## 2024-11-01 DIAGNOSIS — Z981 Arthrodesis status: Secondary | ICD-10-CM

## 2024-11-01 DIAGNOSIS — Z8679 Personal history of other diseases of the circulatory system: Secondary | ICD-10-CM

## 2024-11-01 DIAGNOSIS — M1712 Unilateral primary osteoarthritis, left knee: Secondary | ICD-10-CM

## 2024-11-01 DIAGNOSIS — Z86718 Personal history of other venous thrombosis and embolism: Secondary | ICD-10-CM

## 2024-11-01 DIAGNOSIS — Z8719 Personal history of other diseases of the digestive system: Secondary | ICD-10-CM

## 2024-11-01 DIAGNOSIS — M5030 Other cervical disc degeneration, unspecified cervical region: Secondary | ICD-10-CM

## 2024-11-01 DIAGNOSIS — Z9889 Other specified postprocedural states: Secondary | ICD-10-CM

## 2024-11-01 DIAGNOSIS — M797 Fibromyalgia: Secondary | ICD-10-CM

## 2024-11-01 DIAGNOSIS — R7989 Other specified abnormal findings of blood chemistry: Secondary | ICD-10-CM

## 2024-11-01 DIAGNOSIS — M1120 Other chondrocalcinosis, unspecified site: Secondary | ICD-10-CM

## 2024-11-01 DIAGNOSIS — Z8619 Personal history of other infectious and parasitic diseases: Secondary | ICD-10-CM

## 2024-11-01 DIAGNOSIS — Z5181 Encounter for therapeutic drug level monitoring: Secondary | ICD-10-CM

## 2024-11-01 DIAGNOSIS — M65342 Trigger finger, left ring finger: Secondary | ICD-10-CM

## 2024-11-01 DIAGNOSIS — E782 Mixed hyperlipidemia: Secondary | ICD-10-CM

## 2024-11-08 ENCOUNTER — Encounter

## 2024-11-08 NOTE — Telephone Encounter (Signed)
 Last Office Visit: 08/16/2024  Next Office Visit: 02/19/2025  Last Refill: 09/03/2023

## 2024-11-14 MED ORDER — LANSOPRAZOLE 30 MG PO CPDR
30 | ORAL_CAPSULE | ORAL | 3 refills | 60.00000 days | Status: AC
Start: 2024-11-14 — End: ?

## 2024-11-21 ENCOUNTER — Ambulatory Visit

## 2024-11-22 ENCOUNTER — Ambulatory Visit (INDEPENDENT_AMBULATORY_CARE_PROVIDER_SITE_OTHER)

## 2024-11-22 VITALS — BP 113/70 | HR 79 | Resp 18 | Ht 66.0 in | Wt 167.0 lb

## 2024-11-22 DIAGNOSIS — E538 Deficiency of other specified B group vitamins: Secondary | ICD-10-CM | POA: Diagnosis not present

## 2024-11-22 NOTE — Addendum Note (Signed)
 Addended byBETHA DUWAINE RIGGS on: 11/22/2024 11:21 AM   Modules accepted: Orders

## 2024-11-22 NOTE — Progress Notes (Signed)
" ° °  Subjective:    Patient ID: Mia [name], female    DOB: 1951/02/04, 74 y.o.   MRN: [id number]  HPI   Patient is in office today for her B12 injection. Patient comes in every 30 days. Denies muscle,cramps, fever, weakness, irregular, or medication changes.   Review of Systems     Objective:   Physical Exam        Assessment & Plan:   Pt given her B12 injection given in her RUOQ, Tolerated both well no redness or swelling noted at the site. Patient advised to RTC in 30 days (around 12/22/24) for next B12 injection  "

## 2024-11-30 ENCOUNTER — Other Ambulatory Visit: Payer: Self-pay | Admitting: Adult Health

## 2024-12-19 ENCOUNTER — Ambulatory Visit: Admitting: Physician Assistant

## 2024-12-20 ENCOUNTER — Ambulatory Visit

## 2025-01-03 ENCOUNTER — Ambulatory Visit: Admitting: Podiatry

## 2025-02-07 ENCOUNTER — Ambulatory Visit: Admitting: Adult Health

## 2025-04-17 ENCOUNTER — Ambulatory Visit: Admitting: Family Medicine

## 2025-05-31 ENCOUNTER — Ambulatory Visit

## 2025-08-03 ENCOUNTER — Ambulatory Visit: Admitting: Internal Medicine
